# Patient Record
Sex: Male | Born: 1960 | Race: White | Hispanic: No | Marital: Married | Smoking: Never smoker
Health system: Southern US, Community
[De-identification: ages and names within clinical notes are randomized; demographics above are authoritative.]

## PROBLEM LIST (undated history)

## (undated) DIAGNOSIS — G4733 Obstructive sleep apnea (adult) (pediatric): Secondary | ICD-10-CM

## (undated) DIAGNOSIS — K219 Gastro-esophageal reflux disease without esophagitis: Secondary | ICD-10-CM

## (undated) DIAGNOSIS — Z950 Presence of cardiac pacemaker: Secondary | ICD-10-CM

## (undated) DIAGNOSIS — G4730 Sleep apnea, unspecified: Secondary | ICD-10-CM

## (undated) DIAGNOSIS — I2510 Atherosclerotic heart disease of native coronary artery without angina pectoris: Secondary | ICD-10-CM

## (undated) DIAGNOSIS — D649 Anemia, unspecified: Secondary | ICD-10-CM

## (undated) DIAGNOSIS — Z95828 Presence of other vascular implants and grafts: Secondary | ICD-10-CM

## (undated) DIAGNOSIS — M1990 Unspecified osteoarthritis, unspecified site: Secondary | ICD-10-CM

## (undated) DIAGNOSIS — Z9989 Dependence on other enabling machines and devices: Secondary | ICD-10-CM

## (undated) DIAGNOSIS — Z8489 Family history of other specified conditions: Secondary | ICD-10-CM

## (undated) DIAGNOSIS — T4145XA Adverse effect of unspecified anesthetic, initial encounter: Secondary | ICD-10-CM

## (undated) DIAGNOSIS — I890 Lymphedema, not elsewhere classified: Secondary | ICD-10-CM

## (undated) DIAGNOSIS — I700 Atherosclerosis of aorta: Secondary | ICD-10-CM

## (undated) DIAGNOSIS — I499 Cardiac arrhythmia, unspecified: Secondary | ICD-10-CM

## (undated) DIAGNOSIS — E785 Hyperlipidemia, unspecified: Secondary | ICD-10-CM

## (undated) DIAGNOSIS — F419 Anxiety disorder, unspecified: Secondary | ICD-10-CM

## (undated) DIAGNOSIS — I10 Essential (primary) hypertension: Secondary | ICD-10-CM

## (undated) DIAGNOSIS — R569 Unspecified convulsions: Secondary | ICD-10-CM

## (undated) DIAGNOSIS — Z8679 Personal history of other diseases of the circulatory system: Secondary | ICD-10-CM

## (undated) DIAGNOSIS — T8859XA Other complications of anesthesia, initial encounter: Secondary | ICD-10-CM

## (undated) DIAGNOSIS — R42 Dizziness and giddiness: Secondary | ICD-10-CM

## (undated) DIAGNOSIS — I447 Left bundle-branch block, unspecified: Secondary | ICD-10-CM

## (undated) DIAGNOSIS — H269 Unspecified cataract: Secondary | ICD-10-CM

## (undated) HISTORY — PX: OTHER SURGICAL HISTORY: SHX169

## (undated) HISTORY — PX: CT RADIATION THERAPY GUIDE: HXRAD513

## (undated) HISTORY — PX: KNEE SURGERY: SHX244

---
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
## 2010-03-27 DIAGNOSIS — C439 Malignant melanoma of skin, unspecified: Secondary | ICD-10-CM

## 2010-03-27 HISTORY — PX: MELANOMA EXCISION WITH SENTINEL LYMPH NODE BIOPSY: SHX5267

## 2010-03-27 HISTORY — DX: Malignant melanoma of skin, unspecified: C43.9

## 2015-05-29 ENCOUNTER — Emergency Department: Payer: BLUE CROSS/BLUE SHIELD

## 2015-05-29 ENCOUNTER — Encounter: Payer: Self-pay | Admitting: Emergency Medicine

## 2015-05-29 ENCOUNTER — Emergency Department
Admission: EM | Admit: 2015-05-29 | Discharge: 2015-05-29 | Disposition: A | Discharge status: Home / Self Care | Payer: BLUE CROSS/BLUE SHIELD | Attending: Student | Admitting: Student

## 2015-05-29 DIAGNOSIS — M25562 Pain in left knee: Secondary | ICD-10-CM | POA: Diagnosis present

## 2015-05-29 DIAGNOSIS — M1710 Unilateral primary osteoarthritis, unspecified knee: Secondary | ICD-10-CM

## 2015-05-29 DIAGNOSIS — M1712 Unilateral primary osteoarthritis, left knee: Secondary | ICD-10-CM | POA: Diagnosis not present

## 2015-05-29 DIAGNOSIS — I10 Essential (primary) hypertension: Secondary | ICD-10-CM | POA: Diagnosis not present

## 2015-05-29 HISTORY — DX: Essential (primary) hypertension: I10

## 2015-05-29 MED ORDER — HYDROCODONE-ACETAMINOPHEN 5-325 MG PO TABS: 1.0000 | ORAL_TABLET | ORAL | Status: DC | PRN

## 2015-05-29 MED ORDER — MELOXICAM 15 MG PO TABS: 15.0000 mg | ORAL_TABLET | Freq: Every day | ORAL | Status: DC

## 2015-05-29 MED ORDER — KETOROLAC TROMETHAMINE 60 MG/2ML IM SOLN
60.0000 mg | Freq: Once | INTRAMUSCULAR | Status: AC
  Administered 2015-05-29: 60 mg via INTRAMUSCULAR
  Filled 2015-05-29: qty 2

## 2015-05-29 NOTE — ED Notes (Signed)
Discussed discharge instructions, prescriptions, and follow-up care with patient. No questions or concerns at this time. Pt stable at discharge.  

## 2015-05-29 NOTE — Discharge Instructions (Signed)
Arthritis Arthritis is a term that is commonly used to refer to joint pain or joint disease. There are more than 100 types of arthritis. CAUSES The most common cause of this condition is wear and tear of a joint. Other causes include:  Gout.  Inflammation of a joint.  An infection of a joint.  Sprains and other injuries near the joint.  A drug reaction or allergic reaction. In some cases, the cause may not be known. SYMPTOMS The main symptom of this condition is pain in the joint with movement. Other symptoms include:  Redness, swelling, or stiffness at a joint.  Warmth coming from the joint.  Fever.  Overall feeling of illness. DIAGNOSIS This condition may be diagnosed with a physical exam and tests, including:  Blood tests.  Urine tests.  Imaging tests, such as MRI, X-rays, or a CT scan. Sometimes, fluid is removed from a joint for testing. TREATMENT Treatment for this condition may involve:  Treatment of the cause, if it is known.  Rest.  Raising (elevating) the joint.  Applying cold or hot packs to the joint.  Medicines to improve symptoms and reduce inflammation.  Injections of a steroid such as cortisone into the joint to help reduce pain and inflammation. Depending on the cause of your arthritis, you may need to make lifestyle changes to reduce stress on your joint. These changes may include exercising more and losing weight. HOME CARE INSTRUCTIONS Medicines  Take over-the-counter and prescription medicines only as told by your health care provider.  Do not take aspirin to relieve pain if gout is suspected. Activities  Rest your joint if told by your health care provider. Rest is important when your disease is active and your joint feels painful, swollen, or stiff.  Avoid activities that make the pain worse. It is important to balance activity with rest.  Exercise your joint regularly with range-of-motion exercises as told by your health care  provider. Try doing low-impact exercise, such as:  Swimming.  Water aerobics.  Biking.  Walking. Joint Care  If your joint is swollen, keep it elevated if told by your health care provider.  If your joint feels stiff in the morning, try taking a warm shower.  If directed, apply heat to the joint. If you have diabetes, do not apply heat without permission from your health care provider.  Put a towel between the joint and the hot pack or heating pad.  Leave the heat on the area for 20-30 minutes.  If directed, apply ice to the joint:  Put ice in a plastic bag.  Place a towel between your skin and the bag.  Leave the ice on for 20 minutes, 2-3 times per day.  Keep all follow-up visits as told by your health care provider. This is important. SEEK MEDICAL CARE IF:  The pain gets worse.  You have a fever. SEEK IMMEDIATE MEDICAL CARE IF:  You develop severe joint pain, swelling, or redness.  Many joints become painful and swollen.  You develop severe back pain.  You develop severe weakness in your leg.  You cannot control your bladder or bowels.   This information is not intended to replace advice given to you by your health care provider. Make sure you discuss any questions you have with your health care provider.   Document Released: 04/20/2004 Document Revised: 12/02/2014 Document Reviewed: 06/08/2014 Elsevier Interactive Patient Education 2016 [place] therapy can help ease sore, stiff, injured, and tight muscles and  joints. Heat relaxes your muscles, which may help ease your pain. Heat therapy should only be used on old, pre-existing, or long-lasting (chronic) injuries. Do not use heat therapy unless told by your doctor. HOW TO USE HEAT THERAPY There are several different kinds of heat therapy, including:  Moist heat pack.  Warm water bath.  Hot water bottle.  Electric heating pad.  Heated gel pack.  Heated wrap.  Electric  heating pad. GENERAL HEAT THERAPY RECOMMENDATIONS   Do not sleep while using heat therapy. Only use heat therapy while you are awake.  Your skin may turn pink while using heat therapy. Do not use heat therapy if your skin turns red.  Do not use heat therapy if you have new pain.  High heat or long exposure to heat can cause burns. Be careful when using heat therapy to avoid burning your skin.  Do not use heat therapy on areas of your skin that are already irritated, such as with a rash or sunburn. GET HELP IF:   You have blisters, redness, swelling (puffiness), or numbness.  You have new pain.  Your pain is worse. MAKE SURE YOU:  Understand these instructions.  Will watch your condition.  Will get help right away if you are not doing well or get worse.   This information is not intended to replace advice given to you by your health care provider. Make sure you discuss any questions you have with your health care provider.   Document Released: 06/05/2011 Document Revised: 04/03/2014 Document Reviewed: 05/06/2013 Elsevier Interactive Patient Education 2016 Elsevier Inc.  Osteoarthritis Osteoarthritis is a disease that causes soreness and inflammation of a joint. It occurs when the cartilage at the affected joint wears down. Cartilage acts as a cushion, covering the ends of bones where they meet to form a joint. Osteoarthritis is the most common form of arthritis. It often occurs in older people. The joints affected most often by this condition include those in the:  Ends of the fingers.  Thumbs.  Neck.  Lower back.  Knees.  Hips. CAUSES  Over time, the cartilage that covers the ends of bones begins to wear away. This causes bone to rub on bone, producing pain and stiffness in the affected joints.  RISK FACTORS Certain factors can increase your chances of having osteoarthritis, including:  Older age.  Excessive body weight.  Overuse of joints.  Previous joint  injury. SIGNS AND SYMPTOMS   Pain, swelling, and stiffness in the joint.  Over time, the joint may lose its normal shape.  Small deposits of bone (osteophytes) may grow on the edges of the joint.  Bits of bone or cartilage can break off and float inside the joint space. This may cause more pain and damage. DIAGNOSIS  Your health care provider will do a physical exam and ask about your symptoms. Various tests may be ordered, such as:  X-rays of the affected joint.  Blood tests to rule out other types of arthritis. Additional tests may be used to diagnose your condition. TREATMENT  Goals of treatment are to control pain and improve joint function. Treatment plans may include:  A prescribed exercise program that allows for rest and joint relief.  A weight control plan.  Pain relief techniques, such as:  Properly applied heat and cold.  Electric pulses delivered to nerve endings under the skin (transcutaneous electrical nerve stimulation [TENS]).  Massage.  Certain nutritional supplements.  Medicines to control pain, such as:  Acetaminophen.  Nonsteroidal anti-inflammatory  drugs (NSAIDs), such as naproxen.  Narcotic or central-acting agents, such as tramadol.  Corticosteroids. These can be given orally or as an injection.  Surgery to reposition the bones and relieve pain (osteotomy) or to remove loose pieces of bone and cartilage. Joint replacement may be needed in advanced states of osteoarthritis. HOME CARE INSTRUCTIONS   Take medicines only as directed by your health care provider.  Maintain a healthy weight. Follow your health care provider's instructions for weight control. This may include dietary instructions.  Exercise as directed. Your health care provider can recommend specific types of exercise. These may include:  Strengthening exercises. These are done to strengthen the muscles that support joints affected by arthritis. They can be performed with weights  or with exercise bands to add resistance.  Aerobic activities. These are exercises, such as brisk walking or low-impact aerobics, that get your heart pumping.  Range-of-motion activities. These keep your joints limber.  Balance and agility exercises. These help you maintain daily living skills.  Rest your affected joints as directed by your health care provider.  Keep all follow-up visits as directed by your health care provider. SEEK MEDICAL CARE IF:   Your skin turns red.  You develop a rash in addition to your joint pain.  You have worsening joint pain.  You have a fever along with joint or muscle aches. SEEK IMMEDIATE MEDICAL CARE IF:  You have a significant loss of weight or appetite.  You have night sweats. [place] of Arthritis and Musculoskeletal and Skin Diseases: www.niams.SouthExposed.es  [employer] on Aging: http://kim-miller.com/  American College of Rheumatology: www.rheumatology.org   This information is not intended to replace advice given to you by your health care provider. Make sure you discuss any questions you have with your health care provider.   Document Released: 03/13/2005 Document Revised: 04/03/2014 Document Reviewed: 11/18/2012 Elsevier Interactive Patient Education [employer].

## 2015-05-29 NOTE — ED Notes (Signed)
L knee pain since yesterday, no known injury

## 2015-05-29 NOTE — ED Provider Notes (Signed)
[facility] Emergency Department Provider Note  ____________________________________________  Time seen: Approximately 8:08 AM  I have reviewed the triage vital signs and the nursing notes.   HISTORY  Chief Complaint Knee Pain    HPI Edward Pitts is a 55 y.o. male with a past medical history of left knee surgery years ago presents with a sudden onset of left knee pain since yesterday and worse this morning. States that he's got increased pain with flexion and standing or bearing weight. Denies any recent trauma. Describes pain as 10 over 10 when exacerbated.Denies any history of gout.   Past Medical History  Diagnosis Date  . Hypertension     There are no active problems to display for this patient.   Past Surgical History  Procedure Laterality Date  . Knee surgery Left     Current Outpatient Rx  Name  Route  Sig  Dispense  Refill  . HYDROcodone-acetaminophen (NORCO) 5-325 MG tablet   Oral   Take 1-2 tablets by mouth every 4 (four) hours as needed for moderate pain.   15 tablet   0   . meloxicam (MOBIC) 15 MG tablet   Oral   Take 1 tablet (15 mg total) by mouth daily.   30 tablet   0     Allergies Review of patient's allergies indicates no known allergies.  No family history on file.  Social History Social History  Substance Use Topics  . Smoking status: Never Smoker   . Smokeless tobacco: None  . Alcohol Use: No    Review of Systems Constitutional: No fever/chills Musculoskeletal: Positive for left knee pain. Skin: Negative for rash. Neurological: Negative for headaches, focal weakness or numbness.  10-point ROS otherwise negative.  ____________________________________________   PHYSICAL EXAM:  VITAL SIGNS: ED Triage Vitals  Enc Vitals Group     BP 05/29/15 0744 168/67 mmHg     Pulse Rate 05/29/15 0744 79     Resp 05/29/15 0744 20     Temp 05/29/15 0744 97.9 F (36.6 C)     Temp Source 05/29/15 0744 Oral   SpO2 05/29/15 0745 97 %     Weight 05/29/15 0744 220 lb (99.791 kg)     Height 05/29/15 0744 5\' 8"  (1.727 m)     Head Cir --      Peak Flow --      Pain Score 05/29/15 0745 10     Pain Loc --      Pain Edu? --      Excl. in [place]? --     Constitutional: Alert and oriented. Well appearing and in no acute distress. Cardiovascular: Normal rate, regular rhythm. Grossly normal heart sounds.  Good peripheral circulation. Respiratory: Normal respiratory effort.  No retractions. Lungs CTAB. Musculoskeletal: Left knee with limited range of motion increased pain with flexion. Distally neurovascularly intact. Positive grinding and crepitus sensation noted. Positive warmth, tenderness and edema noted. Neurologic:  Normal speech and language. No gross focal neurologic deficits are appreciated. No gait instability. Skin:  Skin is warm, dry and intact. No rash noted. Psychiatric: Mood and affect are normal. Speech and behavior are normal.  ____________________________________________   LABS (all labs ordered are listed, but only abnormal results are displayed)  Labs Reviewed - No data to display  RADIOLOGY  IMPRESSION: 1. No acute findings. 2. Moderate tricompartmental degenerative change of the knee. 3. Peripherally calcified ossicle adjacent to the posterior lateral aspect of the tibia likely the sequela of remote avulsive injury. ____________________________________________  PROCEDURES  Procedure(s) performed: None  Critical Care performed: No  ____________________________________________   INITIAL IMPRESSION / ASSESSMENT AND PLAN / ED COURSE  Pertinent labs & imaging results that were available during my care of the patient were reviewed by me and considered in my medical decision making (see chart for details).  Severe degenerative joint disease of the left knee/osteoarthritis. Rx given for Mobic 15 mg daily, hydrocodone 5/325. Patient states he does not need crutches and will  follow-up with orthopedics as soon as possible. ____________________________________________   FINAL CLINICAL IMPRESSION(S) / ED DIAGNOSES  Final diagnoses:  Osteoarthritis of knee, unilateral     This chart was dictated using voice recognition software/Dragon. Despite best efforts to proofread, errors can occur which can change the meaning. Any change was purely unintentional.   Arlyss Repress, PA-C 05/29/15 [id number]  Joanne Gavel, MD 05/29/15 [phone]

## 2017-05-23 ENCOUNTER — Emergency Department
Admission: EM | Admit: 2017-05-23 | Discharge: 2017-05-23 | Disposition: A | Discharge status: Home / Self Care | Payer: BLUE CROSS/BLUE SHIELD | Attending: Emergency Medicine | Admitting: Emergency Medicine

## 2017-05-23 ENCOUNTER — Encounter: Payer: Self-pay | Admitting: Emergency Medicine

## 2017-05-23 DIAGNOSIS — I10 Essential (primary) hypertension: Secondary | ICD-10-CM | POA: Diagnosis not present

## 2017-05-23 DIAGNOSIS — R55 Syncope and collapse: Secondary | ICD-10-CM | POA: Diagnosis not present

## 2017-05-23 DIAGNOSIS — R42 Dizziness and giddiness: Secondary | ICD-10-CM | POA: Diagnosis present

## 2017-05-23 DIAGNOSIS — Z79899 Other long term (current) drug therapy: Secondary | ICD-10-CM | POA: Diagnosis not present

## 2017-05-23 LAB — BASIC METABOLIC PANEL
ANION GAP: 10 (ref 5–15)
BUN: 29 mg/dL — ABNORMAL HIGH (ref 6–20)
CALCIUM: 9.3 mg/dL (ref 8.9–10.3)
CO2: 24 mmol/L (ref 22–32)
Chloride: 105 mmol/L (ref 101–111)
Creatinine, Ser: 1.65 mg/dL — ABNORMAL HIGH (ref 0.61–1.24)
GFR calc non Af Amer: 45 mL/min — ABNORMAL LOW (ref >60)
GFR, EST AFRICAN AMERICAN: 52 mL/min — AB (ref >60)
Glucose, Bld: 106 mg/dL — ABNORMAL HIGH (ref 65–99)
Potassium: 3.9 mmol/L (ref 3.5–5.1)
SODIUM: 139 mmol/L (ref 135–145)

## 2017-05-23 LAB — URINALYSIS, COMPLETE (UACMP) WITH MICROSCOPIC
BILIRUBIN URINE: NEGATIVE
Bacteria, UA: NONE SEEN
GLUCOSE, UA: NEGATIVE mg/dL
HGB URINE DIPSTICK: NEGATIVE
KETONES UR: NEGATIVE mg/dL
LEUKOCYTES UA: NEGATIVE
NITRITE: NEGATIVE
PH: 5 (ref 5.0–8.0)
Protein, ur: NEGATIVE mg/dL
RBC / HPF: NONE SEEN RBC/hpf (ref 0–5)
Specific Gravity, Urine: 1.018 (ref 1.005–1.030)
Squamous Epithelial / LPF: NONE SEEN

## 2017-05-23 LAB — CBC
HCT: 38.8 % — ABNORMAL LOW (ref 40.0–52.0)
HEMOGLOBIN: 13.3 g/dL (ref 13.0–18.0)
MCH: 30 pg (ref 26.0–34.0)
MCHC: 34.2 g/dL (ref 32.0–36.0)
MCV: 87.7 fL (ref 80.0–100.0)
Platelets: 290 10*3/uL (ref 150–440)
RBC: 4.42 MIL/uL (ref 4.40–5.90)
RDW: 13.5 % (ref 11.5–14.5)
WBC: 7.3 10*3/uL (ref 3.8–10.6)

## 2017-05-23 LAB — TROPONIN I
Troponin I: <0.03 ng/mL (ref <0.03)
Troponin I: <0.03 ng/mL (ref <0.03)

## 2017-05-23 MED ORDER — SODIUM CHLORIDE 0.9 % IV BOLUS (SEPSIS)
1000.0000 mL | Freq: Once | INTRAVENOUS | Status: AC
  Administered 2017-05-23: 1000 mL via INTRAVENOUS

## 2017-05-23 NOTE — ED Notes (Signed)
FIRST NURSE NOTE: pt states he was getting out of his car a few minutes PTA and had a near syncopal episode. States he was dizzy and sat down on the ground, states lasted 1-2 minutes. States still having dizziness but not as bad. Denies C/P or SOB.Marland Kitchen

## 2017-05-23 NOTE — Discharge Instructions (Signed)
Return to the ER for new, worsening, or persistent dizziness, weakness, recurrent passing out, chest pain, difficulty breathing, severe headache, or any other new or worsening symptoms that concern you.  Follow-up with your primary care doctor within the next 1-2 weeks.  We have provided a referral to a cardiologist in the area, however if you would prefer to follow-up with somebody else through your regular doctor you may do so as well.  Your EKG shows a "left bundle branch block."  At this time this does not appear to be related to your symptoms, but you should see a cardiologist to determine if you need any other tests as an outpatient.

## 2017-05-23 NOTE — ED Provider Notes (Signed)
[facility] Emergency Department Provider Note ____________________________________________   First MD Initiated Contact with Patient 05/23/17 1423     (approximate)  I have reviewed the triage vital signs and the nursing notes.   HISTORY  Chief Complaint Dizziness    HPI Edward Pitts is a 57 y.o. male with past medical history of hypertension who presents with near syncope, acute onset while the patient was getting out of his truck, causing him to fall to the ground.  The patient states that it occurred approximately hour prior to arrival.  He states that before it happened he started to feel intensely dizzy for a few moments.  He states that he now feels slightly dizzy.  He describes the dizziness as more lightheaded than spinning or movement.  He denies any associated headache, nausea or vomiting, chest pain, difficulty breathing, fevers, or weakness.  No prior history of this symptom.  Patient states he was in his usual state of health until today.   Past Medical History:  Diagnosis Date  . Hypertension     There are no active problems to display for this patient.   Past Surgical History:  Procedure Laterality Date  . KNEE SURGERY Left     Prior to Admission medications   Medication Sig Start Date End Date Taking? Authorizing Provider  meloxicam (MOBIC) 15 MG tablet Take 1 tablet (15 mg total) by mouth daily. 05/29/15  Yes Beers, Pierce Crane, PA-C  HYDROcodone-acetaminophen (NORCO) 5-325 MG tablet Take 1-2 tablets by mouth every 4 (four) hours as needed for moderate pain. Patient not taking: Reported on 05/23/2017 05/29/15   Arlyss Repress, PA-C    Allergies Patient has no known allergies.  No family history on file.  Social History Social History   Tobacco Use  . Smoking status: Never Smoker  . Smokeless tobacco: Never Used  Substance Use Topics  . Alcohol use: No  . Drug use: No    Review of Systems  Constitutional: No  fever/chills. Eyes: No visual changes. ENT: No neck pain. Cardiovascular: Denies chest pain. Respiratory: Denies shortness of breath. Gastrointestinal: No nausea, no vomiting.  No diarrhea.  Genitourinary: Negative for dysuria.  Musculoskeletal: Negative for back pain. Skin: Negative for rash. Neurological: Negative for headaches, focal weakness or numbness.   ____________________________________________   PHYSICAL EXAM:  VITAL SIGNS: ED Triage Vitals  Enc Vitals Group     BP 05/23/17 1402 (!) 157/57     Pulse Rate 05/23/17 1402 85     Resp 05/23/17 1402 17     Temp 05/23/17 1402 98.3 F (36.8 C)     Temp Source 05/23/17 1402 Oral     SpO2 05/23/17 1402 95 %     Weight 05/23/17 1403 225 lb (102.1 kg)     Height 05/23/17 1403 5\' 7"  (1.702 m)     Head Circumference --      Peak Flow --      Pain Score --      Pain Loc --      Pain Edu? --      Excl. in [place]? --     Constitutional: Alert and oriented. Well appearing and in no acute distress. Eyes: Conjunctivae are normal.  Head: Atraumatic. Nose: No congestion/rhinnorhea. Mouth/Throat: Mucous membranes are moist.   Neck: Normal range of motion.  Cardiovascular: Normal rate, regular rhythm. Grossly normal heart sounds.  Good peripheral circulation. Respiratory: Normal respiratory effort.  No retractions. Lungs CTAB. Gastrointestinal: Soft and nontender. No distention.  Genitourinary: No flank tenderness. Musculoskeletal: No lower extremity edema.  Extremities warm and well perfused.  Motor and sensory intact in all extremities.  Normal coordination with no ataxia on finger to nose, cranial nerves III through XII intact. Neurologic:  Normal speech and language. No gross focal neurologic deficits are appreciated.  Skin:  Skin is warm and dry. No rash noted. Psychiatric: Mood and affect are normal. Speech and behavior are normal.  ____________________________________________   LABS (all labs ordered are listed, but only  abnormal results are displayed)  Labs Reviewed  BASIC METABOLIC PANEL - Abnormal; Notable for the following components:      Result Value   Glucose, Bld 106 (*)    BUN 29 (*)    Creatinine, Ser 1.65 (*)    GFR calc non Af Amer 45 (*)    GFR calc Af Amer 52 (*)    All other components within normal limits  CBC - Abnormal; Notable for the following components:   HCT 38.8 (*)    All other components within normal limits  URINALYSIS, COMPLETE (UACMP) WITH MICROSCOPIC - Abnormal; Notable for the following components:   Color, Urine YELLOW (*)    APPearance CLEAR (*)    All other components within normal limits  TROPONIN I  TROPONIN I  CBG MONITORING, ED   ____________________________________________  EKG  ED ECG REPORT I, Arta Silence, the attending physician, personally viewed and interpreted this ECG.  Date: 05/23/2017 EKG Time: 1404 Rate: 85 Rhythm: normal sinus rhythm QRS Axis: Left axis Intervals: Left bundle branch block ST/T Wave abnormalities: normal Narrative Interpretation: LBBB; no old EKG for comparison   ED ECG REPORT I, Arta Silence, the attending physician, personally viewed and interpreted this ECG.  Date: 05/23/2017 EKG Time: 1703 Rate: 75 Rhythm: normal sinus rhythm QRS Axis: Left axis Intervals: LBBB ST/T Wave abnormalities: normal Narrative Interpretation: No significant change when compared to EKG of 1404 today   ____________________________________________  RADIOLOGY    ____________________________________________   PROCEDURES  Procedure(s) performed: No  Procedures  Critical Care performed: No ____________________________________________   INITIAL IMPRESSION / ASSESSMENT AND PLAN / ED COURSE  Pertinent labs & imaging results that were available during my care of the patient were reviewed by me and considered in my medical decision making (see chart for details).  56 year old male with history of hypertension  presents with near syncope or very brief syncope, with associated fall and with a brief prodrome of dizziness, but no significant associated symptoms.  On exam, the patient is well-appearing, vital signs are normal except for slight hypertension, and the remainder the exam is unremarkable.  Neuro exam is nonfocal.  Patient reports mild remaining lightheadedness but is otherwise asymptomatic.  EKG shows left bundle branch block.  Patient has no prior EKG in our system, and states his last EKG was performed approximately 10 or 11 years ago when he was in the [employer].  He states that his primary care doctor is not performed one that he can remember.  He has not heard about having a left bundle branch block previously.  Overall the presentation is most consistent with vasovagal syncope or other benign cause.  Given that there was a prodrome and patient has some lightheadedness, this is less consistent with cardiac cause acutely.  Patient has no chest pain, shortness of breath, or other anginal equivalents to suggest ACS, and there is no evidence that this left bundle represents an acute cardiac event.  Plan: Basic labs, cardiac enzymes x2, IV  fluids, and reassess.    ----------------------------------------- 8:40 PM on 05/23/2017 -----------------------------------------  Basic labs are within normal limits, and troponin has been negative x2.  Repeat EKG shows LBBB unchanged.  The patient is feeling  better after fluids.  At this time, there is no indication for additional ED workup or observation.  The patient feels well to go home.  Return precautions given, and he expresses understanding.  He agrees to follow-up with his primary care doctor.  We also have provided cardiology referral.  ____________________________________________   FINAL CLINICAL IMPRESSION(S) / ED DIAGNOSES  Final diagnoses:  Near syncope      NEW MEDICATIONS STARTED DURING THIS VISIT:  Discharge Medication List as of  05/23/2017  7:30 PM       Note:  This document was prepared using Dragon voice recognition software and may include unintentional dictation errors.    Arta Silence, MD 05/23/17 2041

## 2017-05-23 NOTE — ED Notes (Signed)

## 2017-05-23 NOTE — ED Triage Notes (Signed)
Pt comes into the ED via POV c/o dizziness that started on his way to the gym.  Patient states that he feels off ballance instead of the room actually spinning.  He states he did fall trying to get into his car trying to get to the gym.  Patient denies any LOC and is neurologically intact otherwise.  Patient in NAD with even and unlabored respirations.

## 2017-05-25 DIAGNOSIS — I10 Essential (primary) hypertension: Secondary | ICD-10-CM | POA: Insufficient documentation

## 2017-05-25 DIAGNOSIS — I447 Left bundle-branch block, unspecified: Secondary | ICD-10-CM | POA: Insufficient documentation

## 2017-06-27 DIAGNOSIS — I208 Other forms of angina pectoris: Secondary | ICD-10-CM | POA: Diagnosis present

## 2017-06-29 ENCOUNTER — Encounter
Admission: RE | Disposition: A | Discharge status: Home / Self Care | Payer: Self-pay | Source: Ambulatory Visit | Attending: Internal Medicine

## 2017-06-29 ENCOUNTER — Encounter: Payer: Self-pay | Admitting: *Deleted

## 2017-06-29 ENCOUNTER — Ambulatory Visit
Admission: RE | Admit: 2017-06-29 | Discharge: 2017-06-29 | Disposition: A | Discharge status: Home / Self Care | Payer: BLUE CROSS/BLUE SHIELD | Source: Ambulatory Visit | Attending: Internal Medicine | Admitting: Internal Medicine

## 2017-06-29 DIAGNOSIS — Z79899 Other long term (current) drug therapy: Secondary | ICD-10-CM | POA: Diagnosis not present

## 2017-06-29 DIAGNOSIS — R079 Chest pain, unspecified: Secondary | ICD-10-CM

## 2017-06-29 DIAGNOSIS — I25119 Atherosclerotic heart disease of native coronary artery with unspecified angina pectoris: Secondary | ICD-10-CM | POA: Diagnosis not present

## 2017-06-29 DIAGNOSIS — I10 Essential (primary) hypertension: Secondary | ICD-10-CM | POA: Insufficient documentation

## 2017-06-29 DIAGNOSIS — E7800 Pure hypercholesterolemia, unspecified: Secondary | ICD-10-CM | POA: Insufficient documentation

## 2017-06-29 DIAGNOSIS — Z791 Long term (current) use of non-steroidal anti-inflammatories (NSAID): Secondary | ICD-10-CM | POA: Insufficient documentation

## 2017-06-29 DIAGNOSIS — I208 Other forms of angina pectoris: Secondary | ICD-10-CM | POA: Diagnosis present

## 2017-06-29 DIAGNOSIS — I447 Left bundle-branch block, unspecified: Secondary | ICD-10-CM | POA: Diagnosis not present

## 2017-06-29 DIAGNOSIS — I2089 Other forms of angina pectoris: Secondary | ICD-10-CM | POA: Diagnosis present

## 2017-06-29 HISTORY — DX: Hyperlipidemia, unspecified: E78.5

## 2017-06-29 HISTORY — PX: LEFT HEART CATH AND CORONARY ANGIOGRAPHY: CATH118249

## 2017-06-29 SURGERY — LEFT HEART CATH AND CORONARY ANGIOGRAPHY
Anesthesia: Moderate Sedation | Laterality: Left

## 2017-06-29 MED ORDER — MIDAZOLAM HCL 2 MG/2ML IJ SOLN
INTRAMUSCULAR | Status: DC | PRN
  Administered 2017-06-29: 1 mg via INTRAVENOUS

## 2017-06-29 MED ORDER — FENTANYL CITRATE (PF) 100 MCG/2ML IJ SOLN
INTRAMUSCULAR | Status: AC
  Filled 2017-06-29: qty 2

## 2017-06-29 MED ORDER — SODIUM CHLORIDE 0.9 % WEIGHT BASED INFUSION
3.0000 mL/kg/h | INTRAVENOUS | Status: AC
  Administered 2017-06-29: 3 mL/kg/h via INTRAVENOUS

## 2017-06-29 MED ORDER — ACETAMINOPHEN 325 MG PO TABS: 650.0000 mg | ORAL_TABLET | ORAL | Status: DC | PRN

## 2017-06-29 MED ORDER — SODIUM CHLORIDE 0.9 % IV SOLN: 250.0000 mL | INTRAVENOUS | Status: DC | PRN

## 2017-06-29 MED ORDER — HEPARIN (PORCINE) IN NACL 2-0.9 UNIT/ML-% IJ SOLN
INTRAMUSCULAR | Status: AC
  Filled 2017-06-29: qty 1000

## 2017-06-29 MED ORDER — IOPAMIDOL (ISOVUE-300) INJECTION 61%
INTRAVENOUS | Status: DC | PRN
  Administered 2017-06-29: 110 mL via INTRA_ARTERIAL

## 2017-06-29 MED ORDER — SODIUM CHLORIDE 0.9% FLUSH: 3.0000 mL | INTRAVENOUS | Status: DC | PRN

## 2017-06-29 MED ORDER — FENTANYL CITRATE (PF) 100 MCG/2ML IJ SOLN
INTRAMUSCULAR | Status: DC | PRN
  Administered 2017-06-29: 50 ug via INTRAVENOUS

## 2017-06-29 MED ORDER — SODIUM CHLORIDE 0.9 % WEIGHT BASED INFUSION: 1.0000 mL/kg/h | INTRAVENOUS | Status: DC

## 2017-06-29 MED ORDER — SODIUM CHLORIDE 0.9% FLUSH: 3.0000 mL | Freq: Two times a day (BID) | INTRAVENOUS | Status: DC

## 2017-06-29 MED ORDER — ONDANSETRON HCL 4 MG/2ML IJ SOLN: 4.0000 mg | Freq: Four times a day (QID) | INTRAMUSCULAR | Status: DC | PRN

## 2017-06-29 MED ORDER — ASPIRIN 81 MG PO CHEW: 81.0000 mg | CHEWABLE_TABLET | ORAL | Status: DC

## 2017-06-29 MED ORDER — MIDAZOLAM HCL 2 MG/2ML IJ SOLN
INTRAMUSCULAR | Status: AC
  Filled 2017-06-29: qty 2

## 2017-06-29 SURGICAL SUPPLY — 9 items
CATH INFINITI 5FR ANG PIGTAIL (CATHETERS) ×3 IMPLANT
CATH INFINITI 5FR JL4 (CATHETERS) ×3 IMPLANT
CATH INFINITI JR4 5F (CATHETERS) ×3 IMPLANT
DEVICE CLOSURE MYNXGRIP 5F (Vascular Products) ×3 IMPLANT
KIT MANI 3VAL PERCEP (MISCELLANEOUS) ×3 IMPLANT
NEEDLE PERC 18GX7CM (NEEDLE) ×3 IMPLANT
PACK CARDIAC CATH (CUSTOM PROCEDURE TRAY) ×3 IMPLANT
SHEATH PINNACLE 5F 10CM (SHEATH) ×3 IMPLANT
WIRE GUIDERIGHT .035X150 (WIRE) ×3 IMPLANT

## 2017-06-29 NOTE — Discharge Instructions (Signed)

## 2017-06-29 NOTE — Progress Notes (Signed)
Patient clinically stable post heart cath with Dr Nehemiah Massed, denies complaints. Family at bedside. Dr Nehemiah Massed out to speak with family/patient with questions answered. No bleeding nor hematoma at right groin site. Discharge instructions given with questions answered.

## 2017-07-09 DIAGNOSIS — R002 Palpitations: Secondary | ICD-10-CM | POA: Insufficient documentation

## 2017-07-09 DIAGNOSIS — I2510 Atherosclerotic heart disease of native coronary artery without angina pectoris: Secondary | ICD-10-CM | POA: Insufficient documentation

## 2017-11-21 ENCOUNTER — Emergency Department
Admission: EM | Admit: 2017-11-21 | Discharge: 2017-11-21 | Disposition: A | Discharge status: Home / Self Care | Payer: BLUE CROSS/BLUE SHIELD | Attending: Emergency Medicine | Admitting: Emergency Medicine

## 2017-11-21 ENCOUNTER — Other Ambulatory Visit: Payer: Self-pay

## 2017-11-21 DIAGNOSIS — Z79899 Other long term (current) drug therapy: Secondary | ICD-10-CM | POA: Insufficient documentation

## 2017-11-21 DIAGNOSIS — R209 Unspecified disturbances of skin sensation: Secondary | ICD-10-CM | POA: Diagnosis not present

## 2017-11-21 DIAGNOSIS — R531 Weakness: Secondary | ICD-10-CM | POA: Diagnosis present

## 2017-11-21 DIAGNOSIS — I10 Essential (primary) hypertension: Secondary | ICD-10-CM | POA: Insufficient documentation

## 2017-11-21 LAB — BASIC METABOLIC PANEL
ANION GAP: 11 (ref 5–15)
BUN: 45 mg/dL — ABNORMAL HIGH (ref 6–20)
CALCIUM: 9.4 mg/dL (ref 8.9–10.3)
CO2: 24 mmol/L (ref 22–32)
Chloride: 103 mmol/L (ref 98–111)
Creatinine, Ser: 1.64 mg/dL — ABNORMAL HIGH (ref 0.61–1.24)
GFR, EST AFRICAN AMERICAN: 52 mL/min — AB (ref >60)
GFR, EST NON AFRICAN AMERICAN: 45 mL/min — AB (ref >60)
GLUCOSE: 104 mg/dL — AB (ref 70–99)
Potassium: 4.8 mmol/L (ref 3.5–5.1)
Sodium: 138 mmol/L (ref 135–145)

## 2017-11-21 LAB — CBC WITH DIFFERENTIAL/PLATELET
BASOS ABS: 0 10*3/uL (ref 0–0.1)
BASOS PCT: 0 %
Eosinophils Absolute: 0.2 10*3/uL (ref 0–0.7)
Eosinophils Relative: 2 %
HEMATOCRIT: 33.7 % — AB (ref 40.0–52.0)
Hemoglobin: 11.6 g/dL — ABNORMAL LOW (ref 13.0–18.0)
LYMPHS PCT: 18 %
Lymphs Abs: 1.5 10*3/uL (ref 1.0–3.6)
MCH: 30.6 pg (ref 26.0–34.0)
MCHC: 34.6 g/dL (ref 32.0–36.0)
MCV: 88.5 fL (ref 80.0–100.0)
Monocytes Absolute: 0.8 10*3/uL (ref 0.2–1.0)
Monocytes Relative: 9 %
NEUTROS ABS: 5.9 10*3/uL (ref 1.4–6.5)
NEUTROS PCT: 71 %
Platelets: 339 10*3/uL (ref 150–440)
RBC: 3.8 MIL/uL — AB (ref 4.40–5.90)
RDW: 13 % (ref 11.5–14.5)
WBC: 8.3 10*3/uL (ref 3.8–10.6)

## 2017-11-21 LAB — TROPONIN I
Troponin I: <0.03 ng/mL (ref <0.03)
Troponin I: <0.03 ng/mL (ref <0.03)

## 2017-11-21 NOTE — Discharge Instructions (Addendum)
Please seek medical attention for any high fevers, chest pain, shortness of breath, change in behavior, persistent vomiting, bloody stool or any other new or concerning symptoms.  

## 2017-11-21 NOTE — ED Provider Notes (Signed)
[facility] Emergency Department Provider Note   ____________________________________________   I have reviewed the triage vital signs and the nursing notes.   HISTORY  Chief Complaint "Shock"  History limited by: Not Limited   HPI Edward Pitts is a 57 y.o. male who presents to the emergency department today because of a feeling of an electrical shock.  Patient states that he felt throughout his whole body.  It lasted about a minute.  While this was going on he felt some right arm weakness.  Patient states that he is felt this same kind of shocklike feeling before.  Last time was a few days ago.  He denies any associated shortness of breath.  Denies any nausea or vomiting.  Per medical record review patient has a history of HTN, HLD  Past Medical History:  Diagnosis Date  . Hyperlipidemia   . Hypertension     Patient Active Problem List   Diagnosis Date Noted  . 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 KNEE SURGERY Left   . LEFT HEART CATH AND CORONARY ANGIOGRAPHY Left 06/29/2017   Procedure: LEFT HEART CATH AND CORONARY ANGIOGRAPHY;  Surgeon: [name], MD;  Location: [facility];  Service: Cardiovascular;  Laterality: Left;    Prior to Admission medications   Medication Sig Start Date End Date Taking? Authorizing Provider  atorvastatin (LIPITOR) 40 MG tablet Take 40 mg by mouth at bedtime.    [provider]  hydrochlorothiazide (HYDRODIURIL) 25 MG tablet Take 25 mg by mouth daily.    [provider]  hydrocortisone 2.5 % cream Apply 1 application topically daily as needed.     [provider]  ketoconazole (NIZORAL) 2 % cream Apply 1 application topically daily as needed.     [provider]  lisinopril (PRINIVIL,ZESTRIL) 10 MG tablet Take 10 mg by mouth daily.    [provider]  Multiple Vitamins-Minerals (CENTRUM SILVER PO) Take 1 tablet  by mouth daily.    [provider]    Allergies Patient has no known allergies.  History reviewed. No pertinent family history.  Social History Social History   Tobacco Use  . Smoking status: Never Smoker  . Smokeless tobacco: Never Used  Substance Use Topics  . Alcohol use: No  . Drug use: No    Review of Systems Constitutional: No fever/chills Eyes: No visual changes. ENT: No sore throat. Cardiovascular: Denies chest pain. Respiratory: Denies shortness of breath. Gastrointestinal: No abdominal pain.  No nausea, no vomiting.  No diarrhea.   Genitourinary: Negative for dysuria. Musculoskeletal: Negative for back pain. Skin: Negative for rash. Neurological: Right arm weakness.  ____________________________________________   PHYSICAL EXAM:  VITAL SIGNS: ED Triage Vitals  Enc Vitals Group     BP 11/21/17 1551 129/64     Pulse Rate 11/21/17 1551 76     Resp 11/21/17 1551 16     Temp 11/21/17 1551 98.9 F (37.2 C)     Temp Source 11/21/17 1551 Oral     SpO2 11/21/17 1551 97 %     Weight 11/21/17 1554 225 lb (102.1 kg)     Height 11/21/17 1554 5\' 7"  (1.702 m)     Head Circumference --      Peak Flow --      Pain Score 11/21/17 1551 0   Constitutional: Alert and oriented.  Eyes: Conjunctivae are normal.  ENT      Head: Normocephalic  and atraumatic.      Nose: No congestion/rhinnorhea.      Mouth/Throat: Mucous membranes are moist.      Neck: No stridor. Hematological/Lymphatic/Immunilogical: No cervical lymphadenopathy. Cardiovascular: Normal rate, regular rhythm.  No murmurs, rubs, or gallops.  Respiratory: Normal respiratory effort without tachypnea nor retractions. Breath sounds are clear and equal bilaterally. No wheezes/rales/rhonchi. Gastrointestinal: Soft and non tender. No rebound. No guarding.  Genitourinary: Deferred Musculoskeletal: Normal range of motion in all extremities. No lower extremity edema. Neurologic:  Normal speech and  language. No gross focal neurologic deficits are appreciated.  Skin:  Skin is warm, dry and intact. No rash noted. Psychiatric: Mood and affect are normal. Speech and behavior are normal. Patient exhibits appropriate insight and judgment.  ____________________________________________    LABS (pertinent positives/negatives)  Trop <0.03 BMP na 138, k 4.8, glu 104, cr 1.64 CBC wbc 8.3, hgb 11.6, plt 339 Trop <0.03 x 2  ____________________________________________   EKG  I, Nance Pear, attending physician, personally viewed and interpreted this EKG  EKG Time: 1552 Rate: 74 Rhythm: sinus rhythm Axis: left axis deviation Intervals: qtc 484 QRS: LBBB ST changes: no st elevation equivilaent Impression: abnormal ekg   ____________________________________________    RADIOLOGY  None  ____________________________________________   PROCEDURES  Procedures  ____________________________________________   INITIAL IMPRESSION / ASSESSMENT AND PLAN / ED COURSE  Pertinent labs & imaging results that were available during my care of the patient were reviewed by me and considered in my medical decision making (see chart for details).   Presented to the emergency department today because of the sensation of electrical shock going through his body.  Unclear etiology.  Would consider electrolyte abnormalities, additionally would consider potential heart issue.  Patient's work-up including 2 sets of troponin without concerning findings.  Discussed with patient importance of following up with primary care.   ____________________________________________   FINAL CLINICAL IMPRESSION(S) / ED DIAGNOSES  Final diagnoses:  Sensation problem     Note: This dictation was prepared with Dragon dictation. Any transcriptional errors that result from this process are unintentional     Nance Pear, MD 11/21/17 2023

## 2017-11-21 NOTE — ED Triage Notes (Signed)
Pt arrived via [facility] from home with complaint of feeling a shock on right side and then weakness in the right arm. EMS states pt was sitting at home when he felt a shock go through body like a electric fence. Pt AxOx4. No distress noted.

## 2017-12-04 ENCOUNTER — Encounter: Payer: Self-pay | Admitting: Emergency Medicine

## 2017-12-04 ENCOUNTER — Other Ambulatory Visit: Payer: Self-pay

## 2017-12-04 ENCOUNTER — Emergency Department
Admission: EM | Admit: 2017-12-04 | Discharge: 2017-12-04 | Disposition: A | Discharge status: Home / Self Care | Payer: BLUE CROSS/BLUE SHIELD | Attending: Emergency Medicine | Admitting: Emergency Medicine

## 2017-12-04 ENCOUNTER — Emergency Department: Payer: BLUE CROSS/BLUE SHIELD

## 2017-12-04 DIAGNOSIS — Z79899 Other long term (current) drug therapy: Secondary | ICD-10-CM | POA: Diagnosis not present

## 2017-12-04 DIAGNOSIS — I10 Essential (primary) hypertension: Secondary | ICD-10-CM | POA: Insufficient documentation

## 2017-12-04 DIAGNOSIS — R42 Dizziness and giddiness: Secondary | ICD-10-CM | POA: Diagnosis not present

## 2017-12-04 DIAGNOSIS — R251 Tremor, unspecified: Secondary | ICD-10-CM

## 2017-12-04 LAB — COMPREHENSIVE METABOLIC PANEL
ALBUMIN: 4.3 g/dL (ref 3.5–5.0)
ALK PHOS: 89 U/L (ref 38–126)
ALT: 37 U/L (ref 0–44)
AST: 54 U/L — AB (ref 15–41)
Anion gap: 10 (ref 5–15)
BILIRUBIN TOTAL: 0.9 mg/dL (ref 0.3–1.2)
BUN: 37 mg/dL — ABNORMAL HIGH (ref 6–20)
CO2: 23 mmol/L (ref 22–32)
Calcium: 9.4 mg/dL (ref 8.9–10.3)
Chloride: 104 mmol/L (ref 98–111)
Creatinine, Ser: 1.67 mg/dL — ABNORMAL HIGH (ref 0.61–1.24)
GFR calc Af Amer: 51 mL/min — ABNORMAL LOW (ref >60)
GFR, EST NON AFRICAN AMERICAN: 44 mL/min — AB (ref >60)
Glucose, Bld: 103 mg/dL — ABNORMAL HIGH (ref 70–99)
Potassium: 4.8 mmol/L (ref 3.5–5.1)
Sodium: 137 mmol/L (ref 135–145)
Total Protein: 7.9 g/dL (ref 6.5–8.1)

## 2017-12-04 LAB — CBC
HEMATOCRIT: 33.1 % — AB (ref 40.0–52.0)
HEMOGLOBIN: 11.6 g/dL — AB (ref 13.0–18.0)
MCH: 30.7 pg (ref 26.0–34.0)
MCHC: 35 g/dL (ref 32.0–36.0)
MCV: 88 fL (ref 80.0–100.0)
Platelets: 314 10*3/uL (ref 150–440)
RBC: 3.76 MIL/uL — AB (ref 4.40–5.90)
RDW: 12.9 % (ref 11.5–14.5)
WBC: 7.3 10*3/uL (ref 3.8–10.6)

## 2017-12-04 LAB — TROPONIN I

## 2017-12-04 MED ORDER — SODIUM CHLORIDE 0.9 % IV SOLN
1000.0000 mL | Freq: Once | INTRAVENOUS | Status: AC
  Administered 2017-12-04: 1000 mL via INTRAVENOUS

## 2017-12-04 NOTE — ED Notes (Signed)
Pt returned from CT at this time.  

## 2017-12-04 NOTE — ED Notes (Signed)
Pt taken to POV in a WC. VSS. NAD. Discharge instructions, and follow up reviewed. All questions and concerns addressed.

## 2017-12-04 NOTE — ED Provider Notes (Signed)
[facility] Emergency Department Provider Note   ____________________________________________    I have reviewed the triage vital signs and the nursing notes.   HISTORY  Chief Complaint Tremors and Dizziness     HPI Edward Pitts is a 57 y.o. male who presents with complaints of dizziness and tremors.  Patient reports he was at work today when he began having tremors in his hands bilaterally as well as dizziness.  He denies headache or neuro deficits.  He has had this a couple of times before.  Seen in the emergency department 1 week ago for similar complaints.  Denies chest pain.  No nausea or vomiting.  Has a neurology appointment in 2 days to further evaluate these complaints.  Currently reports he feels much better and is asymptomatic  Past Medical History:  Diagnosis Date  . Hyperlipidemia   . Hypertension     Patient Active Problem List   Diagnosis Date Noted  . Stable angina ([place]) 06/27/2017    Past Surgical History:  Procedure Laterality Date  . KNEE SURGERY Left   . LEFT HEART CATH AND CORONARY ANGIOGRAPHY Left 06/29/2017   Procedure: LEFT HEART CATH AND CORONARY ANGIOGRAPHY;  Surgeon: [name], MD;  Location: [facility];  Service: Cardiovascular;  Laterality: Left;    Prior to Admission medications   Medication Sig Start Date End Date Taking? Authorizing Provider  atorvastatin (LIPITOR) 40 MG tablet Take 40 mg by mouth at bedtime.   Yes [provider]  hydrochlorothiazide (HYDRODIURIL) 25 MG tablet Take 25 mg by mouth daily.   Yes [provider]  hydrocortisone 2.5 % cream Apply 1 application topically daily as needed (for itching).    Yes [provider]  ketoconazole (NIZORAL) 2 % cream Apply 1 application topically daily as needed for irritation.    Yes [provider]  lisinopril (PRINIVIL,ZESTRIL) 10 MG tablet Take 10 mg by mouth daily.   Yes [provider]    meloxicam (MOBIC) 7.5 MG tablet Take 7.5 mg by mouth 2 (two) times daily as needed for pain.    Yes [provider]  metoprolol succinate (TOPROL-XL) 25 MG 24 hr tablet Take 25 mg by mouth daily.   Yes [provider]  Multiple Vitamins-Minerals (CENTRUM SILVER PO) Take 1 tablet by mouth daily.   Yes [provider]     Allergies Patient has no known allergies.  History reviewed. No pertinent family history.  Social History Social History   Tobacco Use  . Smoking status: Never Smoker  . Smokeless tobacco: Never Used  Substance Use Topics  . Alcohol use: No  . Drug use: No    Review of Systems  Constitutional: As above Eyes: No visual changes.  ENT: No sore throat. Cardiovascular: Denies chest pain. Respiratory: Denies shortness of breath. Gastrointestinal: No abdominal pain.  Genitourinary: Negative for dysuria. Musculoskeletal: Negative for back pain. Skin: Negative for rash. Neurological: As above   ____________________________________________   PHYSICAL EXAM:  VITAL SIGNS: ED Triage Vitals  Enc Vitals Group     BP 12/04/17 0925 (!) 147/75     Pulse Rate 12/04/17 0925 66     Resp 12/04/17 0925 18     Temp 12/04/17 0925 98.1 F (36.7 C)     Temp Source 12/04/17 0925 Oral     SpO2 12/04/17 0925 96 %     Weight 12/04/17 0925 102 kg (224 lb 13.9 oz)     Height 12/04/17 0925  1.702 m (5\' 7" )     Head Circumference --      Peak Flow --      Pain Score 12/04/17 0928 0     Pain Loc --      Pain Edu? --      Excl. in [place]? --     Constitutional: Alert and oriented. No acute distress. Pleasant and interactive  Nose: No congestion/rhinnorhea. Mouth/Throat: Mucous membranes are moist.   Neck:  Painless ROM Cardiovascular: Normal rate, regular rhythm. Good peripheral circulation. Respiratory: Normal respiratory effort.  No retractions. Lungs CTAB. Gastrointestinal: Soft and nontender. No distention.  No CVA  tenderness.  Musculoskeletal: No lower extremity tenderness nor edema.  Warm and well perfused Neurologic:  Normal speech and language. No gross focal neurologic deficits are appreciated.  Skin:  Skin is warm, dry and intact. No rash noted. Psychiatric: Mood and affect are normal. Speech and behavior are normal.  ____________________________________________   LABS (all labs ordered are listed, but only abnormal results are displayed)  Labs Reviewed  CBC - Abnormal; Notable for the following components:      Result Value   RBC 3.76 (*)    Hemoglobin 11.6 (*)    HCT 33.1 (*)    All other components within normal limits  COMPREHENSIVE METABOLIC PANEL - Abnormal; Notable for the following components:   Glucose, Bld 103 (*)    BUN 37 (*)    Creatinine, Ser 1.67 (*)    AST 54 (*)    GFR calc non Af Amer 44 (*)    GFR calc Af Amer 51 (*)    All other components within normal limits  TROPONIN I   ____________________________________________  EKG  ED ECG REPORT I, Lavonia Drafts, the attending physician, personally viewed and interpreted this ECG.  Date: 12/04/2017  Rhythm: normal sinus rhythm QRS Axis: normal Intervals: Left bundle branch block, chronic ST/T Wave abnormalities: normal Narrative Interpretation: no evidence of acute ischemia  ____________________________________________  RADIOLOGY  CT head ____________________________________________   PROCEDURES  Procedure(s) performed: No  Procedures   Critical Care performed: No ____________________________________________   INITIAL IMPRESSION / ASSESSMENT AND PLAN / ED COURSE  Pertinent labs & imaging results that were available during my care of the patient were reviewed by me and considered in my medical decision making (see chart for details).  Patient well-appearing in no acute distress, asymptomatic at this time.  Unclear cause of tremors, dizziness.  No chest pain or palpitations to suggest coronary  cause, recent cardiac catheterization was reassuring.  Possibility of focal seizure seems unlikely, will obtain imaging.  We will give IV fluids in the meantime and reevaluate  Imaging is quite reassuring, lab work is unchanged from prior.  Patient feels better after IV fluids.  Has follow-up with neurology in 2 days.  No indication for admission at this time, recommend increase hydration outpatient follow-up    ____________________________________________   FINAL CLINICAL IMPRESSION(S) / ED DIAGNOSES  Final diagnoses:  Dizziness  Occasional tremors        Note:  This document was prepared using Dragon voice recognition software and may include unintentional dictation errors.    Lavonia Drafts, MD 12/04/17 1257

## 2017-12-04 NOTE — ED Triage Notes (Signed)
Pt arrived via [facility] with wife from work with reports of having a spell of shaking and seeing black spots, pt states he can tell when he is going to get a spell and told someone at work. Pt states his head starts spinning. Pt states the spell passed, but states then another one came back again.  Pt seen here in the last 1-2 weeks for same sxs, pt is supposed to see Dr. Jannifer Franklin on Thursday in [place] at [facility].

## 2017-12-04 NOTE — ED Notes (Signed)
Patient transported to CT 

## 2017-12-06 ENCOUNTER — Ambulatory Visit (INDEPENDENT_AMBULATORY_CARE_PROVIDER_SITE_OTHER): Payer: BLUE CROSS/BLUE SHIELD | Admitting: Neurology

## 2017-12-06 ENCOUNTER — Encounter: Payer: Self-pay | Admitting: Neurology

## 2017-12-06 ENCOUNTER — Telehealth: Payer: Self-pay | Admitting: Neurology

## 2017-12-06 VITALS — BP 139/65 | HR 64 | Ht 67.0 in | Wt 223.0 lb

## 2017-12-06 DIAGNOSIS — R202 Paresthesia of skin: Secondary | ICD-10-CM | POA: Diagnosis not present

## 2017-12-06 NOTE — Telephone Encounter (Signed)
[employer] pending faxed clinical notes  °

## 2017-12-06 NOTE — Progress Notes (Signed)
Reason for visit: Paresthesias, tremors  Referring physician: Dr. Philippa Sicks Pitts is a 57 y.o. male  History of present illness:  [name] is a 57 year old right-handed white male with a history of unusual events that began in February 2019.  The patient claims that the very first event occurred while he was going out to his car, he suddenly felt shock sensations going through his head, arms, body, and legs.  The patient collapsed to the ground and had some tremors involving both arms, he did not lose consciousness.  The episode lasted about 2 minutes and then the patient recovered fully.  The patient has had more than 5 such episodes, he went to the emergency room on 21 November 2017 and on 04 December 2017 with similar events.  The patient indicates that he has never lost consciousness, he does not bite his tongue, he does not lose control of the bowels or the bladder.  He comes in with a family member who witnessed one event, she claimed that the patient was able to talk during the episode but the speech was somewhat slurred, the patient appeared to have dilated pupils, but he was fully conscious.  He was shaking on both arms, the patient himself feels shock sensation throughout the body, he has no residual symptoms.  He has been out of work since 10 September, he brings up several times during the evaluation today that he may have to go on medical disability.  He also wonders whether he could get service-connected for this.  The patient may have some dizziness with the above events, he has had one event of what sounds like to vertigo.  The patient may have some blurring of vision and spots in front of the eyes, but no loss of vision.  He denies any neck pain or low back pain.  He may feel weak all over following an event but he does not have any permanent weakness or numbness.  He has had long-term issues with mild gait instability.  He has undergone a CT scan of the brain that is  unremarkable.  He is sent to this office for an evaluation.  Past Medical History:  Diagnosis Date  . Hyperlipidemia   . Hypertension     Past Surgical History:  Procedure Laterality Date  . KNEE SURGERY Left   . LEFT HEART CATH AND CORONARY ANGIOGRAPHY Left 06/29/2017   Procedure: LEFT HEART CATH AND CORONARY ANGIOGRAPHY;  Surgeon: [name], MD;  Location: [facility];  Service: Cardiovascular;  Laterality: Left;    History reviewed. No pertinent family history.  Social history:  reports that he has never smoked. He has never used smokeless tobacco. He reports that he does not drink alcohol or use drugs.  Medications:  Prior to Admission medications   Medication Sig Start Date End Date Taking? Authorizing Provider  atorvastatin (LIPITOR) 40 MG tablet Take 40 mg by mouth at bedtime.   Yes [provider]  hydrochlorothiazide (HYDRODIURIL) 25 MG tablet Take 25 mg by mouth daily.   Yes [provider]  hydrocortisone 2.5 % cream Apply 1 application topically daily as needed (for itching).    Yes [provider]  ketoconazole (NIZORAL) 2 % cream Apply 1 application topically daily as needed for irritation.    Yes [provider]  lisinopril (PRINIVIL,ZESTRIL) 10 MG tablet Take 10 mg by mouth daily.   Yes [provider]  meloxicam (MOBIC) 7.5 MG tablet Take 7.5  mg by mouth 2 (two) times daily as needed for pain.    Yes [provider]  metoprolol succinate (TOPROL-XL) 25 MG 24 hr tablet Take 25 mg by mouth daily.   Yes [provider]  Multiple Vitamins-Minerals (CENTRUM SILVER PO) Take 1 tablet by mouth daily.   Yes [provider]     No Known Allergies  ROS:  Out of a complete 14 system review of symptoms, the patient complains only of the following symptoms, and all other reviewed systems are negative.  Vertigo Blurred vision  Blood pressure 139/65, pulse 64, height 5\' 7"  (1.702 m),  weight 223 lb (101.2 kg).  Physical Exam  General: The patient is alert and cooperative at the time of the examination.  Patient is mildly to moderately obese.  Eyes: Pupils are equal, round, and reactive to light. Discs are flat bilaterally.  Neck: The neck is supple, no carotid bruits are noted.  Respiratory: The respiratory examination is clear.  Cardiovascular: The cardiovascular examination reveals a regular rate and rhythm, no obvious murmurs or rubs are noted.  Skin: Extremities are without significant edema.  Neurologic Exam  Mental status: The patient is alert and oriented x 3 at the time of the examination. The patient has apparent normal recent and remote memory, with an apparently normal attention span and concentration ability.  Cranial nerves: Facial symmetry is present. There is good sensation of the face to pinprick and soft touch bilaterally. The strength of the facial muscles and the muscles to head turning and shoulder shrug are normal bilaterally. Speech is well enunciated, no aphasia or dysarthria is noted. Extraocular movements are full. Visual fields are full. The tongue is midline, and the patient has symmetric elevation of the soft palate. No obvious hearing deficits are noted.  Motor: The motor testing reveals 5 over 5 strength of all 4 extremities. Good symmetric motor tone is noted throughout.  Sensory: Sensory testing is intact to pinprick, soft touch, vibration sensation, and position sense on all 4 extremities, with exception some decrease in position sense in the right foot. No evidence of extinction is noted.  Coordination: Cerebellar testing reveals good finger-nose-finger and heel-to-shin bilaterally.  Gait and station: Gait is slightly wide-based, unsteady.  Tandem gait is unsteady.  Romberg is negative. No drift is seen.  Reflexes: Deep tendon reflexes are symmetric and normal bilaterally. Toes are downgoing bilaterally.   CT head  12/04/17:  IMPRESSION: Negative head CT.  * CT scan images were reviewed online. I agree with the written report.    Assessment/Plan:  1.  Episodes of total body paresthesias, bilateral upper extremity tremors  The patient has unusual events that are unassociated with loss of consciousness.  The events do not sound typical for seizure events as they are associated with bilateral motor events unassociated with loss of consciousness.  The patient does not have altered sensorium during the episodes.  The patient is already talking about going on medical disability.  The patient will be set up for an evaluation, he will have MRI of the brain and cervical spine, he will have blood work done today.  An EEG study will be done.  The patient was given a note to keep him out of work until 24 December 2017.  It is possible that the above events may represent panic attack or other behavioral event.  Jill Alexanders MD 12/06/2017 8:39 AM  [facility] [address] [place], [place] [address]  Phone [phone] Fax  [phone]  

## 2017-12-06 NOTE — Telephone Encounter (Signed)
[employer] Josem Kaufmann: [id number] (exp. 12/06/17 to 01/04/18) order sent to GI. They will reach out to the pt to schedule.

## 2017-12-08 LAB — ENA+DNA/DS+SJORGEN'S
ENA RNP Ab: 0.2 AI (ref 0.0–0.9)
ENA SM Ab Ser-aCnc: <0.2 AI (ref 0.0–0.9)
ENA SSA (RO) Ab: <0.2 AI (ref 0.0–0.9)
ENA SSB (LA) Ab: <0.2 AI (ref 0.0–0.9)
dsDNA Ab: 1 IU/mL (ref 0–9)

## 2017-12-08 LAB — TSH: TSH: 2.72 u[IU]/mL (ref 0.450–4.500)

## 2017-12-08 LAB — SEDIMENTATION RATE: Sed Rate: 10 mm/hr (ref 0–30)

## 2017-12-08 LAB — COPPER, SERUM: COPPER: 138 ug/dL (ref 72–166)

## 2017-12-08 LAB — ANA W/REFLEX: ANA: POSITIVE — AB

## 2017-12-08 LAB — B. BURGDORFI ANTIBODIES: Lyme IgG/IgM Ab: <0.91 {ISR} (ref 0.00–0.90)

## 2017-12-08 LAB — VITAMIN B12: VITAMIN B 12: 788 pg/mL (ref 232–1245)

## 2017-12-09 ENCOUNTER — Telehealth: Payer: Self-pay | Admitting: Neurology

## 2017-12-09 NOTE — Telephone Encounter (Signed)
I called the patient.  The blood work is unremarkable with exception that the ANA is positive, the antibody panel associated with this is completely negative, it is unlikely that the positive ANA has any clinical significance.

## 2017-12-11 NOTE — Telephone Encounter (Signed)
Pt returning Dr. Jannifer Franklin' call, stating he would like to discuss his lab work in greater detail

## 2017-12-11 NOTE — Telephone Encounter (Signed)
I called the patient.  Basically, the patient's blood work was normal, the ANA was positive but the entire antibody panel was negative, this likely has no clinical significance.

## 2017-12-12 ENCOUNTER — Telehealth: Payer: Self-pay | Admitting: Neurology

## 2017-12-12 ENCOUNTER — Ambulatory Visit (INDEPENDENT_AMBULATORY_CARE_PROVIDER_SITE_OTHER): Payer: BLUE CROSS/BLUE SHIELD | Admitting: Neurology

## 2017-12-12 DIAGNOSIS — R202 Paresthesia of skin: Secondary | ICD-10-CM

## 2017-12-12 DIAGNOSIS — R2990 Unspecified symptoms and signs involving the nervous system: Secondary | ICD-10-CM

## 2017-12-12 NOTE — Telephone Encounter (Signed)
I called the patient.  The EEG study was normal.  We will check MRI of the brain and cervical spine.  For now, the patient was instructed not to operate a motor vehicle until further notice.

## 2017-12-12 NOTE — Procedures (Signed)
    History:  Edward Pitts is a 57 year old gentleman with episodes of shock sensations throughout the body and collapse with tremors involving both upper extremities unassociated with loss of consciousness.  The patient is being evaluated for these events.  This is a routine EEG.  No skull defects are noted.  Medications include Lipitor, hydrochlorothiazide, lisinopril, metoprolol, and multivitamins.  EEG classification: Normal awake  Description of the recording: The background rhythms of this recording consists of a fairly well modulated medium amplitude alpha rhythm of 10 Hz that is reactive to eye opening and closure. As the record progresses, the patient appears to remain in the waking state throughout the recording. Photic stimulation was performed, resulting in a bilateral and symmetric photic driving response. Hyperventilation was also performed, resulting in a minimal buildup of the background rhythm activities without significant slowing seen. At no time during the recording does there appear to be evidence of spike or spike wave discharges or evidence of focal slowing. EKG monitor shows no evidence of cardiac rhythm abnormalities with a heart rate of 72.  Impression: This is a normal EEG recording in the waking state. No evidence of ictal or interictal discharges are seen.

## 2017-12-18 ENCOUNTER — Telehealth: Payer: Self-pay | Admitting: *Deleted

## 2017-12-18 DIAGNOSIS — Z0289 Encounter for other administrative examinations: Secondary | ICD-10-CM

## 2017-12-18 NOTE — Telephone Encounter (Signed)
Pt [employer] claim form on [place].

## 2017-12-22 ENCOUNTER — Ambulatory Visit
Admission: RE | Admit: 2017-12-22 | Discharge: 2017-12-22 | Disposition: A | Discharge status: Home / Self Care | Source: Ambulatory Visit | Attending: Neurology | Admitting: Neurology

## 2017-12-22 DIAGNOSIS — R202 Paresthesia of skin: Secondary | ICD-10-CM | POA: Diagnosis not present

## 2017-12-24 ENCOUNTER — Telehealth: Payer: Self-pay | Admitting: Neurology

## 2017-12-24 ENCOUNTER — Encounter: Payer: Self-pay | Admitting: Neurology

## 2017-12-24 MED ORDER — CLONAZEPAM 0.5 MG PO TABS: 0.5000 mg | ORAL_TABLET | Freq: Every day | ORAL | 3 refills | Status: DC

## 2017-12-24 NOTE — Telephone Encounter (Signed)
I called the patient.  I discussed starting him on clonazepam 0.5 mg at night.  I will send in a letter to allow him to go back to work currently.  The letter will be faxed to [phone].

## 2017-12-24 NOTE — Telephone Encounter (Signed)
Pt called back, he needs note to return to work. He will pick it up. Please call to advise

## 2017-12-24 NOTE — Telephone Encounter (Signed)
  I called the patient.  The MRI studies of the brain and cervical spinal cord were essentially normal, very minimal white matter changes seen.  The patient may be having behavior episodes or panic attacks, I will place the patient on low-dose clonazepam taking 0.5 mg at night.  He is cleared to return to work in full capacity at this point.   MRI brain 12/23/17:  IMPRESSION: This MRI of the brain without contrast shows the following: 1.     Very mild extent of T2/FLAIR hyperintense foci in the periatrial white matter most consistent with minimal chronic microvascular ischemic change.    The pattern is less consistent with demyelination.    2.     There are no acute findings.    MRI cerviclal 12/23/17:   IMPRESSION: This MRI of the cervical spine without contrast shows the following: 1.     The spinal cord appears normal. 2.     At C5-C6, there is minimal spinal stenosis, moderately severe right foraminal narrowing and moderate left foraminal narrowing.  There is potential for right C6 nerve root compression. 3.     There are no acute findings.

## 2017-12-26 NOTE — Telephone Encounter (Signed)
Forms faxed to [place] claims at [phone].

## 2017-12-26 NOTE — Telephone Encounter (Signed)
Gave completed/signed forms back to medical records to process for pt.   

## 2018-01-03 ENCOUNTER — Emergency Department: Payer: BLUE CROSS/BLUE SHIELD

## 2018-01-03 ENCOUNTER — Emergency Department
Admission: EM | Admit: 2018-01-03 | Discharge: 2018-01-03 | Disposition: A | Discharge status: Home / Self Care | Payer: BLUE CROSS/BLUE SHIELD | Attending: Emergency Medicine | Admitting: Emergency Medicine

## 2018-01-03 DIAGNOSIS — Z79899 Other long term (current) drug therapy: Secondary | ICD-10-CM | POA: Insufficient documentation

## 2018-01-03 DIAGNOSIS — I10 Essential (primary) hypertension: Secondary | ICD-10-CM | POA: Diagnosis not present

## 2018-01-03 DIAGNOSIS — M25532 Pain in left wrist: Secondary | ICD-10-CM | POA: Diagnosis present

## 2018-01-03 DIAGNOSIS — M25432 Effusion, left wrist: Secondary | ICD-10-CM | POA: Diagnosis not present

## 2018-01-03 MED ORDER — COLCHICINE 0.6 MG PO TABS
1.2000 mg | ORAL_TABLET | Freq: Once | ORAL | Status: AC
  Administered 2018-01-03: 1.2 mg via ORAL
  Filled 2018-01-03: qty 2

## 2018-01-03 MED ORDER — PREDNISONE 20 MG PO TABS
60.0000 mg | ORAL_TABLET | Freq: Once | ORAL | Status: AC
  Administered 2018-01-03: 60 mg via ORAL
  Filled 2018-01-03: qty 3

## 2018-01-03 MED ORDER — IBUPROFEN 600 MG PO TABS: 600.0000 mg | ORAL_TABLET | Freq: Three times a day (TID) | ORAL | 0 refills | Status: DC | PRN

## 2018-01-03 MED ORDER — IBUPROFEN 600 MG PO TABS
600.0000 mg | ORAL_TABLET | Freq: Once | ORAL | Status: AC
  Administered 2018-01-03: 600 mg via ORAL
  Filled 2018-01-03: qty 1

## 2018-01-03 NOTE — ED Provider Notes (Signed)
[facility] Emergency Department Provider Note  ____________________________________________   First MD Initiated Contact with Patient 01/03/18 0310     (approximate)  I have reviewed the triage vital signs and the nursing notes.   HISTORY  Chief Complaint Wrist Pain   HPI Edward Pitts is a 57 y.o. male who self presents to the emergency department with 2 days of atraumatic left posterior wrist pain.  The patient is right-hand dominant and works as a Arts administrator.  He has no history of the same.  No fevers or chills.  No dental work in multiple years.  No numbness or weakness.  The pain is throbbing aching moderate to severe worse when ranging his hand and improved when not.    Past Medical History:  Diagnosis Date  . Hyperlipidemia   . Hypertension     Patient Active Problem List   Diagnosis Date Noted  . 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 KNEE SURGERY Left   . LEFT HEART CATH AND CORONARY ANGIOGRAPHY Left 06/29/2017   Procedure: LEFT HEART CATH AND CORONARY ANGIOGRAPHY;  Surgeon: [name], MD;  Location: [facility];  Service: Cardiovascular;  Laterality: Left;    Prior to Admission medications   Medication Sig Start Date End Date Taking? Authorizing Provider  atorvastatin (LIPITOR) 40 MG tablet Take 40 mg by mouth at bedtime.    [provider]  clonazePAM (KLONOPIN) 0.5 MG tablet Take 1 tablet (0.5 mg total) by mouth at bedtime. 12/24/17   Kathrynn Ducking, MD  hydrochlorothiazide (HYDRODIURIL) 25 MG tablet Take 25 mg by mouth daily.    [provider]  hydrocortisone 2.5 % cream Apply 1 application topically daily as needed (for itching).     [provider]  ibuprofen (ADVIL,MOTRIN) 600 MG tablet Take 1 tablet (600 mg total) by mouth every 8 (eight) hours as needed. 01/03/18   Darel Hong, MD  ketoconazole (NIZORAL) 2 % cream Apply 1 application  topically daily as needed for irritation.     [provider]  lisinopril (PRINIVIL,ZESTRIL) 10 MG tablet Take 10 mg by mouth daily.    [provider]  meloxicam (MOBIC) 7.5 MG tablet Take 7.5 mg by mouth 2 (two) times daily as needed for pain.     [provider]  metoprolol succinate (TOPROL-XL) 25 MG 24 hr tablet Take 25 mg by mouth daily.    [provider]  Multiple Vitamins-Minerals (CENTRUM SILVER PO) Take 1 tablet by mouth daily.    [provider]    Allergies Patient has no known allergies.  No family history on file.  Social History Social History   Tobacco Use  . Smoking status: Never Smoker  . Smokeless tobacco: Never Used  Substance Use Topics  . Alcohol use: No  . Drug use: No    Review of Systems Constitutional: No fever/chills ENT: Denies dental pain Cardiovascular: Denies chest pain. Respiratory: Denies shortness of breath. Musculoskeletal: Positive for left wrist pain Neurological: Negative for headaches   ____________________________________________   PHYSICAL EXAM:  VITAL SIGNS: ED Triage Vitals  Enc Vitals Group     BP 01/03/18 0213 (!) 158/63     Pulse Rate 01/03/18 0213 77     Resp 01/03/18 0213 18     Temp 01/03/18 0213 98.7 F (37.1 C)     Temp Source 01/03/18 0213 Oral     SpO2 01/03/18 0213 96 %  Weight 01/03/18 0213 225 lb (102.1 kg)     Height 01/03/18 0213 5\' 7"  (1.702 m)     Head Circumference --      Peak Flow --      Pain Score 01/03/18 0218 10     Pain Loc --      Pain Edu? --      Excl. in [place]? --     Constitutional: Alert and oriented x4 appears obviously uncomfortable nontoxic no diaphoresis speaks in full clear sentences MSK: Small left wrist effusion and discomfort when ranging the wrist.  Neurovascularly intact Neurologic:  Normal speech and language. No gross focal neurologic deficits are appreciated.  Skin:  Skin is warm, dry and intact. No rash  noted.    ____________________________________________  LABS (all labs ordered are listed, but only abnormal results are displayed)  Labs Reviewed - No data to display   __________________________________________  EKG   ____________________________________________  RADIOLOGY  X-ray of the left wrist reviewed by me shows chronic changes ____________________________________________   DIFFERENTIAL includes but not limited to  Gout, osteoarthritis, rheumatoid arthritis, septic wrist   PROCEDURES  Procedure(s) performed: no  Procedures  Critical Care performed: no  ____________________________________________   INITIAL IMPRESSION / ASSESSMENT AND PLAN / ED COURSE  Pertinent labs & imaging results that were available during my care of the patient were reviewed by me and considered in my medical decision making (see chart for details).   As part of my medical decision making, I reviewed the following data within the [place] History obtained from family if available, nursing notes, old chart and ekg, as well as notes from prior ED visits.  The patient has a swollen tender left wrist with a small effusion that is not large enough to tap.  He feels improved after nonsteroidals.  Placed in a wrist splint with improvement in his symptoms.  Explained to the patient the diagnostic uncertainty and the importance of follow-up with orthopedic surgery as well as primary care.  Strict return precautions were given.      ____________________________________________   FINAL CLINICAL IMPRESSION(S) / ED DIAGNOSES  Final diagnoses:  Effusion of left wrist      NEW MEDICATIONS STARTED DURING THIS VISIT:  Discharge Medication List as of 01/03/2018  4:53 AM    START taking these medications   Details  ibuprofen (ADVIL,MOTRIN) 600 MG tablet Take 1 tablet (600 mg total) by mouth every 8 (eight) hours as needed., Starting Thu 01/03/2018, Print          Note:  This document was prepared using Dragon voice recognition software and may include unintentional dictation errors.      Darel Hong, MD 01/03/18 [phone]

## 2018-01-03 NOTE — ED Triage Notes (Signed)
Patient c/o left wrist pain and swelling. Patient denies injury. Patient denies hx of the same.

## 2018-01-03 NOTE — Discharge Instructions (Signed)
Please wear your wrist splint at all times until your pain improves.  Please make an appointment to follow-up with the orthopedic surgeon for reevaluation and return to the emergency department for any concerns.  It was a pleasure to take care of you today, and thank you for coming to our emergency department.  If you have any questions or concerns before leaving please ask the nurse to grab me and I'm more than happy to go through your aftercare instructions again.  If you were prescribed any opioid pain medication today such as Norco, Vicodin, Percocet, morphine, hydrocodone, or oxycodone please make sure you do not drive when you are taking this medication as it can alter your ability to drive safely.  If you have any concerns once you are home that you are not improving or are in fact getting worse before you can make it to your follow-up appointment, please do not hesitate to call [phone] and come back for further evaluation.  Edward Hong, MD  Results for orders placed or performed in visit on 12/06/17  Vitamin B12  Result Value Ref Range   Vitamin B-12 788 232 - 1,245 pg/mL  TSH  Result Value Ref Range   TSH 2.720 0.450 - 4.500 uIU/mL  Sedimentation rate  Result Value Ref Range   Sed Rate 10 0 - 30 mm/hr  Copper, serum  Result Value Ref Range   Copper 138 72 - 166 ug/dL  ANA w/Reflex  Result Value Ref Range   Anti Nuclear Antibody(ANA) Positive (A) Negative  B. burgdorfi antibodies  Result Value Ref Range   Lyme IgG/IgM Ab <0.91 0.00 - 0.90 ISR  ENA+DNA/DS+Sjorgen's  Result Value Ref Range   dsDNA Ab 1 0 - 9 IU/mL   ENA RNP Ab 0.2 0.0 - 0.9 AI   ENA SM Ab Ser-aCnc <0.2 0.0 - 0.9 AI   ENA SSA (RO) Ab <0.2 0.0 - 0.9 AI   ENA SSB (LA) Ab <0.2 0.0 - 0.9 AI   See below: Comment    Dg Wrist Complete Left  Result Date: 01/03/2018 CLINICAL DATA:  Left wrist pain and swelling. No injury EXAM: LEFT WRIST - COMPLETE 3+ VIEW COMPARISON:  None. FINDINGS: Degenerative changes in the  radiocarpal, STT, and first carpometacarpal joints of the left wrist. No evidence of acute fracture or dislocation. No focal bone lesion or bone destruction. Soft tissues are unremarkable. IMPRESSION: Degenerative changes in the left wrist. No acute bony abnormalities. Electronically Signed   By: Lucienne Capers M.D.   On: 01/03/2018 02:38   Ct Head Wo Contrast  Result Date: 12/04/2017 CLINICAL DATA:  Seizure. Multiple recent episodes of shaking and visual disturbance with dizziness. EXAM: CT HEAD WITHOUT CONTRAST TECHNIQUE: Contiguous axial images were obtained from the base of the skull through the vertex without intravenous contrast. COMPARISON:  None. FINDINGS: Brain: There is no evidence of acute infarct, intracranial hemorrhage, mass, midline shift, or extra-axial fluid collection. The ventricles and sulci are normal. Vascular: No hyperdense vessel. Skull: No fracture or focal osseous lesion. Sinuses/Orbits: Visualized paranasal sinuses and mastoid air cells are clear. Visualized orbits are unremarkable. Other: None. IMPRESSION: Negative head CT. Electronically Signed   By: Logan Bores M.D.   On: 12/04/2017 11:50   Mr Brain Wo Contrast  Result Date: 12/23/2017  [facility] [address] [place], [place] [address] [phone] NEUROIMAGING REPORT STUDY DATE: 12/22/2017 PATIENT NAME: Edward Pitts DOB: March 30, 1960 MRN: [id number] EXAM: MRI Brain without contrast ORDERING CLINICIAN: Juanda Crumble  Loreta Ave, MD CLINICAL HISTORY: 57 year old man with paresthesias COMPARISON FILMS: None TECHNIQUE: MRI of the brain without contrast was obtained utilizing 5 mm axial slices with T1, T2, T2 flair, SWI and diffusion weighted views.  T1 sagittal and T2 coronal views were obtained. CONTRAST: none IMAGING SITE: [facility], [employer], [place] FINDINGS: On sagittal images, the spinal cord is imaged caudally to C3 and is normal in caliber.   The contents of the posterior fossa are of  normal size and position.   The pituitary gland and optic chiasm appear normal.    Brain volume appears normal.   The ventricles are normal in size and without distortion.  There are no abnormal extra-axial collections of fluid.  The cerebellum and brainstem appears normal.   The deep gray matter appears normal.  In the hemispheres, there are a couple T2/FLAIR hypertense foci in the periatrial white matter.  None of these appear to be acute..  Diffusion weighted images are normal.  Susceptibility weighted images are normal.  The orbits appear normal.   The VIIth/VIIIth nerve complex appears normal.  The mastoid air cells appear normal.  The paranasal sinuses appear normal.  Flow voids are identified within the major intracerebral arteries.     This MRI of the brain without contrast shows the following: 1.     Very mild extent of T2/FLAIR hyperintense foci in the periatrial white matter most consistent with minimal chronic microvascular ischemic change.    The pattern is less consistent with demyelination.   2.     There are no acute findings. INTERPRETING PHYSICIAN: Richard A. Felecia Shelling, MD, PhD, FAAN Certified in  Neuroimaging by [employer]   Mr Cervical Spine Wo Contrast  Result Date: 12/23/2017  [facility] [address] [place], [place] [address] [phone] NEUROIMAGING REPORT STUDY DATE: 12/22/2017 PATIENT NAME: Edward Pitts DOB: July 02, 1960 MRN: [id number] EXAM: MRI of the cervical spine without contrast ORDERING CLINICIAN: Kathrynn Ducking, MD CLINICAL HISTORY: 57 year old man with paresthesias COMPARISON FILMS: None TECHNIQUE: MRI of the cervical spine was obtained utilizing 3 mm sagittal slices from the posterior fossa down to the T2 level with T1, T2 and inversion recovery views. In addition 4 mm axial slices from D9-8 down to T1-2 level were included with T2 and gradient echo views. CONTRAST: None IMAGING SITE: [facility], [place],  [place], [place] FINDINGS: :  On sagittal images, the spine is imaged from above the cervicomedullary junction to T2.  Visible brain appears normal.  Paravertebral soft tissue appears normal.  The spinal cord is of normal caliber and signal.   The vertebral bodies are normally aligned.   The vertebral bodies have normal signal.  The discs and interspaces were further evaluated on axial views from C2 to T1 as follows: C2 - C3:  The disc and interspace appear normal. C3 - C4:  The disc and interspace appear normal. C4 - C5:  The disc and interspace appear normal. C5 - C6: There is a right paramedian disc protrusion and uncovertebral spurring causing borderline spinal stenosis and moderately severe right foraminal narrowing and moderate left foraminal narrowing.  There is potential for right C6 nerve root compression. C6 - C7:  The disc and interspace appear normal. C7 - T1:  The disc and interspace appear normal. T1 - T2:  The disc and interspace appear normal   This MRI of the cervical spine without contrast shows the following: 1.     The spinal  cord appears normal. 2.     At C5-C6, there is minimal spinal stenosis, moderately severe right foraminal narrowing and moderate left foraminal narrowing.  There is potential for right C6 nerve root compression. 3.     There are no acute findings. INTERPRETING PHYSICIAN: Richard A. Felecia Shelling, MD, PhD, FAAN Certified in  [place] by [employer]

## 2018-01-31 ENCOUNTER — Other Ambulatory Visit: Payer: Self-pay | Admitting: Sports Medicine

## 2018-01-31 DIAGNOSIS — M25432 Effusion, left wrist: Secondary | ICD-10-CM

## 2018-01-31 DIAGNOSIS — M19032 Primary osteoarthritis, left wrist: Secondary | ICD-10-CM

## 2018-01-31 DIAGNOSIS — M1812 Unilateral primary osteoarthritis of first carpometacarpal joint, left hand: Secondary | ICD-10-CM

## 2018-01-31 DIAGNOSIS — M25532 Pain in left wrist: Principal | ICD-10-CM

## 2018-01-31 DIAGNOSIS — M659 Synovitis and tenosynovitis, unspecified: Secondary | ICD-10-CM

## 2018-01-31 DIAGNOSIS — R768 Other specified abnormal immunological findings in serum: Secondary | ICD-10-CM

## 2018-02-26 ENCOUNTER — Ambulatory Visit

## 2018-03-05 ENCOUNTER — Encounter: Payer: Self-pay | Admitting: Neurology

## 2018-03-05 ENCOUNTER — Ambulatory Visit (INDEPENDENT_AMBULATORY_CARE_PROVIDER_SITE_OTHER): Payer: BLUE CROSS/BLUE SHIELD | Admitting: Neurology

## 2018-03-05 VITALS — BP 132/68 | HR 73 | Ht 67.0 in | Wt 224.0 lb

## 2018-03-05 DIAGNOSIS — R404 Transient alteration of awareness: Secondary | ICD-10-CM | POA: Diagnosis not present

## 2018-03-05 DIAGNOSIS — R768 Other specified abnormal immunological findings in serum: Secondary | ICD-10-CM | POA: Insufficient documentation

## 2018-03-05 DIAGNOSIS — M25432 Effusion, left wrist: Secondary | ICD-10-CM | POA: Insufficient documentation

## 2018-03-05 MED ORDER — CLONAZEPAM 1 MG PO TABS: 1.0000 mg | ORAL_TABLET | Freq: Every day | ORAL | 1 refills | Status: DC

## 2018-03-05 NOTE — Progress Notes (Signed)
Reason for visit: Shaking episodes  Edward Pitts is an 57 y.o. male  History of present illness:  Edward Pitts is a 57 year old right-handed white male with a history of episodes of shock sensations that would cause him to fall to the ground and shake on all fours without loss of consciousness.  The episodes were felt to be nonorganic in nature, the patient underwent an EEG study and MRI of the brain that were unremarkable.  The patient has been placed on clonazepam, he has had cessation of the shaking events since being on the medication.  He did quite well on the medication without any problems until about 6 weeks ago when he began having brief episodes of "zoning out".  The episodes may occur with sitting or standing.  The episodes will last about 15 minutes, the patient no longer falls to the ground or shakes.  The patient is back to work, but he is on light duty secondary to a tendinopathy of the left hand and wrist.  He is tolerating the clonazepam fairly well, he claims that he does snore at night and some nights he does not sleep well.  He returns to this office for an evaluation.  Past Medical History:  Diagnosis Date  . Hyperlipidemia   . Hypertension     Past Surgical History:  Procedure Laterality Date  . KNEE SURGERY Left   . LEFT HEART CATH AND CORONARY ANGIOGRAPHY Left 06/29/2017   Procedure: LEFT HEART CATH AND CORONARY ANGIOGRAPHY;  Surgeon: Edward Skains, MD;  Location: [facility];  Service: Cardiovascular;  Laterality: Left;    No family history on file.  Social history:  reports that he has never smoked. He has never used smokeless tobacco. He reports that he does not drink alcohol or use drugs.   No Known Allergies  Medications:  Prior to Admission medications   Medication Sig Start Date End Date Taking? Authorizing Provider  atorvastatin (LIPITOR) 40 MG tablet Take 40 mg by mouth at bedtime.   Yes [provider]  clonazePAM (KLONOPIN) 0.5  MG tablet Take 1 tablet (0.5 mg total) by mouth at bedtime. 12/24/17  Yes Kathrynn Ducking, MD  hydrocortisone 2.5 % cream Apply 1 application topically daily as needed (for itching).    Yes [provider]  ketoconazole (NIZORAL) 2 % cream Apply 1 application topically daily as needed for irritation.    Yes [provider]  lisinopril (PRINIVIL,ZESTRIL) 10 MG tablet Take 10 mg by mouth daily.   Yes [provider]  metoprolol succinate (TOPROL-XL) 25 MG 24 hr tablet Take 25 mg by mouth daily.   Yes [provider]  Multiple Vitamins-Minerals (CENTRUM SILVER PO) Take 1 tablet by mouth daily.   Yes [provider]    ROS:  Out of a complete 14 system review of symptoms, the patient complains only of the following symptoms, and all other reviewed systems are negative.  Left hand pain  Blood pressure 132/68, pulse 73, height 5\' 7"  (1.702 m), weight 224 lb (101.6 kg).  Physical Exam  General: The patient is alert and cooperative at the time of the examination.  Skin: No significant peripheral edema is noted in the legs, but the patient appears to have some slight swelling of the left hand.   Neurologic Exam  Mental status: The patient is alert and oriented x 3 at the time of the examination. The patient has apparent normal recent and remote memory, with an apparently  normal attention span and concentration ability.   Cranial nerves: Facial symmetry is present. Speech is normal, no aphasia or dysarthria is noted. Extraocular movements are full. Visual fields are full.  Motor: The patient has good strength in all 4 extremities.  Sensory examination: Soft touch sensation is symmetric on the face, arms, and legs.  Coordination: The patient has good finger-nose-finger and heel-to-shin bilaterally.  Gait and station: The patient has a normal gait. Tandem gait is slightly unsteady. Romberg is negative. No drift is seen.  Reflexes: Deep tendon  reflexes are symmetric.   EEG 12/12/17:  Impression: This is a normal EEG recording in the waking state. No evidence of ictal or interictal discharges are seen.   MRI brain 12/23/17:  IMPRESSION: This MRI of the brain without contrast shows the following: 1. Very mild extent of T2/FLAIR hyperintense foci in the periatrial white matter most consistent with minimal chronic microvascular ischemic change. The pattern is less consistent with demyelination.  2. There are no acute findings.    MRI cervical 12/23/17:   IMPRESSION: This MRI of the cervical spine without contrast shows the following: 1. The spinal cord appears normal. 2. At C5-C6, there is minimal spinal stenosis, moderately severe right foraminal narrowing and moderate left foraminal narrowing. There is potential for right C6 nerve root compression. 3. There are no acute findings.  * MRI scan images were reviewed online. I agree with the written report.    Assessment/Plan:  1.  Shaking episodes, resolved  The patient is now having episodes of "zoning out" that may last 15 seconds, he may have 2 or 3 such episodes a week, the episode started 6 weeks ago.  The clonazepam seems to have improved to the shaking events and shock sensations, he will go up on the dose to 1 mg in the evening.  He will follow-up in 6 months.  A prescription for the 1 mg clonazepam tablets was sent in.  Edward Alexanders MD 03/05/2018 9:55 AM  [facility] [address] [place], [place] [address]  Phone [phone] Fax [phone]

## 2018-03-13 DIAGNOSIS — M1A00X0 Idiopathic chronic gout, unspecified site, without tophus (tophi): Secondary | ICD-10-CM | POA: Insufficient documentation

## 2018-03-27 HISTORY — PX: SUPERFICIAL LYMPH NODE BIOPSY / EXCISION: SUR127

## 2018-05-21 ENCOUNTER — Telehealth: Payer: Self-pay | Admitting: Neurology

## 2018-05-21 ENCOUNTER — Ambulatory Visit (INDEPENDENT_AMBULATORY_CARE_PROVIDER_SITE_OTHER): Payer: BLUE CROSS/BLUE SHIELD | Admitting: Neurology

## 2018-05-21 ENCOUNTER — Encounter: Payer: Self-pay | Admitting: Neurology

## 2018-05-21 VITALS — BP 122/86 | HR 65 | Ht 67.0 in | Wt 231.3 lb

## 2018-05-21 DIAGNOSIS — R55 Syncope and collapse: Secondary | ICD-10-CM

## 2018-05-21 NOTE — Telephone Encounter (Signed)
I contacted the patient and advised Dr. Jannifer Franklin could see him today at 3:00 pm check in time of 2:30 and patient was agreeable.

## 2018-05-21 NOTE — Progress Notes (Signed)
Reason for visit: Near syncopal events  Less Woolsey is an 58 y.o. male  History of present illness:  Mr. Duer is a 58 year old right-handed white male with a history of several recent events of near syncope.  The patient had events on the 21st, 22nd, and 19 May 2018.  The first episode occurred when he stood up from a seated position and then shortly thereafter got dizzy and woozy, he fell back into a chair, he did not black out.  The event lasted about 10 minutes or so.  He did not have vertigo.  He did have some associated shortness of breath, and some sensation that his heart was racing.  The patient denied any chest pain.  He had 2 more events, the last event occurred while sitting in a truck, he had just put gas in the vehicle and then started getting dizzy when he sat down in the truck, the event lasted about 10 to 15 minutes, he had some blurring of vision without loss of vision.  The patient looked slightly flushed, there was no diaphoresis.  The patient reports no focal numbness or weakness of the face, arms, legs.  He feels relatively normal between events.  He was seen by cardiology yesterday, a cardiac monitor was placed.  The patient comes here today for further evaluation.  Past Medical History:  Diagnosis Date  . Hyperlipidemia   . Hypertension     Past Surgical History:  Procedure Laterality Date  . KNEE SURGERY Left   . LEFT HEART CATH AND CORONARY ANGIOGRAPHY Left 06/29/2017   Procedure: LEFT HEART CATH AND CORONARY ANGIOGRAPHY;  Surgeon: [name], MD;  Location: [facility];  Service: Cardiovascular;  Laterality: Left;    History reviewed. No pertinent family history.  Social history:  reports that he has never smoked. He has never used smokeless tobacco. He reports that he does not drink alcohol or use drugs.   No Known Allergies  Medications:  Prior to Admission medications   Medication Sig Start Date End Date Taking? Authorizing Provider   atorvastatin (LIPITOR) 40 MG tablet Take 40 mg by mouth at bedtime.    [provider]  clonazePAM (KLONOPIN) 1 MG tablet Take 1 tablet (1 mg total) by mouth at bedtime. 03/05/18   Kathrynn Ducking, MD  hydrocortisone 2.5 % cream Apply 1 application topically daily as needed (for itching).     [provider]  ketoconazole (NIZORAL) 2 % cream Apply 1 application topically daily as needed for irritation.     [provider]  lisinopril (PRINIVIL,ZESTRIL) 10 MG tablet Take 10 mg by mouth daily.    [provider]  metoprolol succinate (TOPROL-XL) 25 MG 24 hr tablet Take 25 mg by mouth daily.    [provider]  Multiple Vitamins-Minerals (CENTRUM SILVER PO) Take 1 tablet by mouth daily.    [provider]    ROS:  Out of a complete 14 system review of symptoms, the patient complains only of the following symptoms, and all other reviewed systems are negative.  Chills Eye discharge, eye itching, blurred vision Shortness of breath, choking Murmur Nausea Snoring Joint pain, joint swelling, aching muscles, muscle cramps, walking difficulty Dizziness, weakness, tremors  Blood pressure 122/86, pulse 65, height 5\' 7"  (1.702 m), weight 231 lb 5 oz (104.9 kg), SpO2 94 %.   Blood pressure, right arm, standing is 162/76.  Blood pressure, right arm, sitting is 124/70.  Physical Exam  General: The  patient is alert and cooperative at the time of the examination.  The patient is moderately obese.  Skin: No significant peripheral edema is noted.   Neurologic Exam  Mental status: The patient is alert and oriented x 3 at the time of the examination. The patient has apparent normal recent and remote memory, with an apparently normal attention span and concentration ability.   Cranial nerves: Facial symmetry is present. Speech is normal, no aphasia or dysarthria is noted. Extraocular movements are full. Visual fields are full.  Motor: The  patient has good strength in all 4 extremities.  Sensory examination: Soft touch sensation is symmetric on the face, arms, and legs.  Coordination: The patient has good finger-nose-finger and heel-to-shin bilaterally.  Gait and station: The patient has a normal gait. Tandem gait is normal. Romberg is negative. No drift is seen.  Reflexes: Deep tendon reflexes are symmetric.   Assessment/Plan:  1.  Episodic near syncope  The patient has a cardiac monitor in place, we will set him up for a carotid Doppler study.  He will follow-up depending upon the results of the above.  Jill Alexanders MD 05/21/2018 2:57 PM  [facility] [address] [place], [place] [address]  Phone [phone] Fax [phone]

## 2018-05-21 NOTE — Telephone Encounter (Signed)
Pt states he has had several episodes in the last couple of days with dizzy spells and has passed out. Pt would like to be seen soon. Please advise.

## 2018-05-30 ENCOUNTER — Telehealth: Payer: Self-pay

## 2018-05-30 DIAGNOSIS — Z0289 Encounter for other administrative examinations: Secondary | ICD-10-CM

## 2018-05-30 NOTE — Telephone Encounter (Signed)
Patient called back in and stated start date is 05/24/2018 he isnt sure of the end date

## 2018-05-30 NOTE — Telephone Encounter (Signed)
I have added dated of 05/24/18 to [facility] paper work and have place in Dr. Jannifer Franklin office to review/advise.  I did not see from office note on 05/21/18 Dr. Jannifer Franklin mentioning the pt needed to be out of work.  Will see what Dr. Jannifer Franklin recommends.

## 2018-05-30 NOTE — Telephone Encounter (Signed)
I contacted the pt and left vm requesting a call back to verify the start and end date of his FMLA paper work request.

## 2018-05-31 NOTE — Telephone Encounter (Signed)
[employer] form completed by Dr. Jannifer Franklin. Sent to MR for processing.

## 2018-06-03 NOTE — Telephone Encounter (Signed)
I faxed pt [employer] form on 06/03/18 to [phone]

## 2018-06-04 DIAGNOSIS — R001 Bradycardia, unspecified: Secondary | ICD-10-CM | POA: Insufficient documentation

## 2018-06-12 DIAGNOSIS — M19032 Primary osteoarthritis, left wrist: Secondary | ICD-10-CM | POA: Insufficient documentation

## 2018-06-13 ENCOUNTER — Telehealth: Payer: Self-pay | Admitting: Neurology

## 2018-06-13 NOTE — Telephone Encounter (Signed)
Called and LVM asking for patient to call us back in regards to [facility] paperwork and paying $50 prior to doctor filling it out.

## 2018-06-20 ENCOUNTER — Ambulatory Visit (HOSPITAL_COMMUNITY): Admission: RE | Admit: 2018-06-20 | Source: Ambulatory Visit

## 2018-06-27 ENCOUNTER — Telehealth: Payer: Self-pay | Admitting: Neurology

## 2018-06-27 MED ORDER — CLONAZEPAM 0.5 MG PO TABS: ORAL_TABLET | ORAL | 3 refills | Status: DC

## 2018-06-27 NOTE — Addendum Note (Signed)
Addended by: Kathrynn Ducking on: 06/27/2018 03:45 PM   Modules accepted: Orders

## 2018-06-27 NOTE — Telephone Encounter (Signed)
I called the patient.  The patient is started back with the original episodes where he will feel like a shock going through his body start in his head going down to the arms and legs, he will then drop to the ground and tremor on both sides without loss of consciousness.  He has had a cardiac monitor study that he claims was normal.  Initially, the clonazepam did help some these episodes, we will go up on the dose taking 0.5 mg in the morning and 1 mg in the evening.  I will send in a prescription.

## 2018-06-27 NOTE — Telephone Encounter (Signed)
Pt is asking for a call to discuss dizziness & feeling of electricity running thru his body.  Pt has accepted Dr Jannifer Franklin 1st available appointment and is on wait list.  Pt is asking for a call from RN or Dr Jannifer Franklin to discuss how he is feeling while waiting to be seen.  Pt also wants Dr Jannifer Franklin to be aware that he is on his last refill of clonazePAM (KLONOPIN) 1 MG tablet and if Dr Jannifer Franklin wants him to stay on it he will need a refill [id number]

## 2018-08-01 ENCOUNTER — Telehealth: Payer: Self-pay | Admitting: Neurology

## 2018-08-01 ENCOUNTER — Ambulatory Visit (HOSPITAL_COMMUNITY)
Admission: RE | Admit: 2018-08-01 | Discharge: 2018-08-01 | Disposition: A | Discharge status: Home / Self Care | Payer: BLUE CROSS/BLUE SHIELD | Source: Ambulatory Visit | Attending: Neurology | Admitting: Neurology

## 2018-08-01 ENCOUNTER — Other Ambulatory Visit: Payer: Self-pay

## 2018-08-01 DIAGNOSIS — R55 Syncope and collapse: Secondary | ICD-10-CM | POA: Insufficient documentation

## 2018-08-01 NOTE — Progress Notes (Signed)
Carotid duplex       has been completed. Preliminary results can be found under CV proc through chart review. Katie Moch, BS, RDMS, RVT   

## 2018-08-01 NOTE — Telephone Encounter (Signed)
I called the patient.  The carotid Doppler study is unremarkable, the 30-day cardiac monitor study has not yet been done, I will call him and get the results.   Carotid doppler 08/01/18:  Summary: Right Carotid: The extracranial vessels were near-normal with only minimal wall                thickening or plaque.  Left Carotid: The extracranial vessels were near-normal with only minimal wall               thickening or plaque.

## 2018-08-10 ENCOUNTER — Emergency Department
Admission: EM | Admit: 2018-08-10 | Discharge: 2018-08-10 | Disposition: A | Discharge status: Home / Self Care | Payer: BLUE CROSS/BLUE SHIELD | Attending: Emergency Medicine | Admitting: Emergency Medicine

## 2018-08-10 ENCOUNTER — Other Ambulatory Visit: Payer: Self-pay

## 2018-08-10 ENCOUNTER — Encounter: Payer: Self-pay | Admitting: Emergency Medicine

## 2018-08-10 ENCOUNTER — Emergency Department: Payer: BLUE CROSS/BLUE SHIELD

## 2018-08-10 DIAGNOSIS — M25561 Pain in right knee: Secondary | ICD-10-CM | POA: Diagnosis not present

## 2018-08-10 DIAGNOSIS — I10 Essential (primary) hypertension: Secondary | ICD-10-CM | POA: Diagnosis not present

## 2018-08-10 DIAGNOSIS — Z79899 Other long term (current) drug therapy: Secondary | ICD-10-CM | POA: Diagnosis not present

## 2018-08-10 HISTORY — DX: Unspecified convulsions: R56.9

## 2018-08-10 HISTORY — DX: Anxiety disorder, unspecified: F41.9

## 2018-08-10 MED ORDER — TRAMADOL HCL 50 MG PO TABS
50.0000 mg | ORAL_TABLET | Freq: Once | ORAL | Status: AC
  Administered 2018-08-10: 22:00:00 50 mg via ORAL
  Filled 2018-08-10: qty 1

## 2018-08-10 MED ORDER — TRAMADOL HCL 50 MG PO TABS: 50.0000 mg | ORAL_TABLET | Freq: Four times a day (QID) | ORAL | 0 refills | Status: AC | PRN

## 2018-08-10 NOTE — ED Provider Notes (Signed)
[facility] Emergency Department Provider Note  ____________________________________________  Time seen: Approximately 10:48 PM  I have reviewed the triage vital signs and the nursing notes.   HISTORY  Chief Complaint Knee Pain    HPI Edward Pitts is a 58 y.o. male presents to the emergency department with acute right knee pain for 1 day.  Patient reports that he typically has pain along the left knee but states that right knee pain started bothering him.  He denies falls or mechanisms of trauma.  He states that pain feels sharp and he has difficulty bending and straightening his knee.  Patient reports that he took Tylenol earlier tonight which relieved his pain.  No other alleviating measures have been attempted.        Past Medical History:  Diagnosis Date  . Anxiety   . Hyperlipidemia   . Hypertension   . Seizures [facility])     Patient Active Problem List   Diagnosis Date Noted  . 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 KNEE SURGERY Left   . LEFT HEART CATH AND CORONARY ANGIOGRAPHY Left 06/29/2017   Procedure: LEFT HEART CATH AND CORONARY ANGIOGRAPHY;  Surgeon: [name], MD;  Location: [place] CV [facility];  Service: Cardiovascular;  Laterality: Left;    Prior to Admission medications   Medication Sig Start Date End Date Taking? Authorizing Provider  atorvastatin (LIPITOR) 40 MG tablet Take 40 mg by mouth at bedtime.    [provider]  clonazePAM (KLONOPIN) 0.5 MG tablet 1 tablet in the morning, 2 in the evening 06/27/18   Kathrynn Ducking, MD  hydrocortisone 2.5 % cream Apply 1 application topically daily as needed (for itching).     [provider]  ketoconazole (NIZORAL) 2 % cream Apply 1 application topically daily as needed for irritation.     [provider]  lisinopril (PRINIVIL,ZESTRIL) 10 MG tablet Take 10 mg by mouth daily.    [provider]   metoprolol succinate (TOPROL-XL) 25 MG 24 hr tablet Take 25 mg by mouth daily.    [provider]  Multiple Vitamins-Minerals (CENTRUM SILVER PO) Take 1 tablet by mouth daily.    [provider]  traMADol (ULTRAM) 50 MG tablet Take 1 tablet (50 mg total) by mouth every 6 (six) hours as needed for up to 3 days. 08/10/18 08/13/18  Lannie Fields, PA-C    Allergies Ibuprofen  History reviewed. No pertinent family history.  Social History Social History   Tobacco Use  . Smoking status: Never Smoker  . Smokeless tobacco: Never Used  Substance Use Topics  . Alcohol use: No  . Drug use: No     Review of Systems  Constitutional: No fever/chills Eyes: No visual changes. No discharge ENT: No upper respiratory complaints. Cardiovascular: no chest pain. Respiratory: no cough. No SOB. Gastrointestinal: No abdominal pain.  No nausea, no vomiting.  No diarrhea.  No constipation. Musculoskeletal: Patient has right knee pain.  Skin: Negative for rash, abrasions, lacerations, ecchymosis. Neurological: Negative for headaches, focal weakness or numbness.  ____________________________________________   PHYSICAL EXAM:  VITAL SIGNS: ED Triage Vitals  Enc Vitals Group     BP 08/10/18 2036 (!) 164/73     Pulse Rate 08/10/18 2036 69     Resp 08/10/18 2036 17     Temp 08/10/18 2036 98.2 F (36.8 C)     Temp Source 08/10/18 2036 Oral  SpO2 08/10/18 2036 96 %     Weight 08/10/18 2037 225 lb (102.1 kg)     Height 08/10/18 2037 5\' 7"  (1.702 m)     Head Circumference --      Peak Flow --      Pain Score 08/10/18 2036 10     Pain Loc --      Pain Edu? --      Excl. in [place]? --      Constitutional: Alert and oriented. Well appearing and in no acute distress. Eyes: Conjunctivae are normal. PERRL. EOMI. Head: Atraumatic. Cardiovascular: Normal rate, regular rhythm. Normal S1 and S2.  Good peripheral circulation. Respiratory: Normal respiratory effort without tachypnea  or retractions. Lungs CTAB. Good air entry to the bases with no decreased or absent breath sounds. Musculoskeletal: Patient has 5 out of 5 strength in the upper and lower extremities bilaterally and symmetrically.  No deficits appreciated with provocative testing of the right knee.  Palpable dorsalis pedis pulse bilaterally and symmetrically. Neurologic:  Normal speech and language. No gross focal neurologic deficits are appreciated.  Skin:  Skin is warm, dry and intact. No rash noted. Psychiatric: Mood and affect are normal. Speech and behavior are normal. Patient exhibits appropriate insight and judgement.   ____________________________________________   LABS (all labs ordered are listed, but only abnormal results are displayed)  Labs Reviewed - No data to display ____________________________________________  EKG   ____________________________________________  RADIOLOGY I personally viewed and evaluated these images as part of my medical decision making, as well as reviewing the written report by the radiologist.  Dg Knee Complete 4 Views Right  Result Date: 08/10/2018 CLINICAL DATA:  Acute onset of RIGHT knee pain that began last night. No known injuries, though the patient describes crepitus with a popping sensation approximately 1 week ago. Patient unable to fully extend the knee. EXAM: RIGHT KNEE - COMPLETE 4+ VIEW COMPARISON:  None. FINDINGS: No evidence of acute, subacute or healed fractures. Calcified loose body in the superior joint space. Small joint effusion. Joint spaces well-preserved. Enthesopathic spur is at the insertion of the quadriceps tendon on the superior patella and the patellar tendon on the inferior patella. IMPRESSION: 1. No acute or subacute osseous abnormality. 2. Calcified loose body in the superior joint space. 3. Small joint effusion. Electronically Signed   By: Evangeline Dakin M.D.   On: 08/10/2018 21:51     ____________________________________________    PROCEDURES  Procedure(s) performed:    Procedures    Medications  traMADol (ULTRAM) tablet 50 mg (50 mg Oral Given 08/10/18 2221)     ____________________________________________   INITIAL IMPRESSION / ASSESSMENT AND PLAN / ED COURSE  Pertinent labs & imaging results that were available during my care of the patient were reviewed by me and considered in my medical decision making (see chart for details).  Review of the [place] CSRS was performed in accordance of the [place] prior to dispensing any controlled drugs.           Assessment and plan Right knee pain Patient presents to the emergency department with sharp right knee pain that is occurred for the past 2 days and is worsened with flexion and extension at the right knee.  X-ray examination revealed no acute bony abnormality.  Patient was discharged with a short course of tramadol and was advised to follow-up with orthopedics as needed.  All patient questions were answered.  ____________________________________________  FINAL CLINICAL IMPRESSION(S) / ED DIAGNOSES  Final diagnoses:  Acute pain  of right knee      NEW MEDICATIONS STARTED DURING THIS VISIT:  ED Discharge Orders         Ordered    traMADol (ULTRAM) 50 MG tablet  Every 6 hours PRN     08/10/18 2213              This chart was dictated using voice recognition software/Dragon. Despite best efforts to proofread, errors can occur which can change the meaning. Any change was purely unintentional.    Lannie Fields, PA-C 08/10/18 [place], [place], MD 08/11/18 [phone]

## 2018-08-10 NOTE — ED Notes (Signed)
PA in to speak with pt.

## 2018-08-10 NOTE — ED Triage Notes (Addendum)
Pt reports pain to right knee since last night; says he remembers a popping sensation in the knee one night last week but didn't have any pain; pt says he has to have a left knee replacement and is wondering if the right knee pain is just from compensating for the left; denies specific injury; pt says he cannot fully extend right leg at the knee; pt says he took some OTC pain medication prior to arrival but cannot say if it had tylenol or motrin in it

## 2018-08-16 ENCOUNTER — Other Ambulatory Visit: Payer: Self-pay | Admitting: Student

## 2018-08-16 ENCOUNTER — Other Ambulatory Visit (HOSPITAL_COMMUNITY): Payer: Self-pay | Admitting: Student

## 2018-08-16 DIAGNOSIS — M7041 Prepatellar bursitis, right knee: Secondary | ICD-10-CM

## 2018-08-16 DIAGNOSIS — M2341 Loose body in knee, right knee: Secondary | ICD-10-CM

## 2018-08-16 DIAGNOSIS — M1711 Unilateral primary osteoarthritis, right knee: Secondary | ICD-10-CM

## 2018-08-24 ENCOUNTER — Other Ambulatory Visit: Payer: Self-pay

## 2018-08-24 ENCOUNTER — Inpatient Hospital Stay
Admission: EM | Admit: 2018-08-24 | Discharge: 2018-08-29 | DRG: 853 | Disposition: A | Discharge status: Home / Self Care | Payer: BLUE CROSS/BLUE SHIELD | Attending: Internal Medicine | Admitting: Internal Medicine

## 2018-08-24 DIAGNOSIS — R001 Bradycardia, unspecified: Secondary | ICD-10-CM | POA: Diagnosis present

## 2018-08-24 DIAGNOSIS — R569 Unspecified convulsions: Secondary | ICD-10-CM

## 2018-08-24 DIAGNOSIS — J690 Pneumonitis due to inhalation of food and vomit: Secondary | ICD-10-CM | POA: Diagnosis present

## 2018-08-24 DIAGNOSIS — I10 Essential (primary) hypertension: Secondary | ICD-10-CM | POA: Diagnosis present

## 2018-08-24 DIAGNOSIS — Z23 Encounter for immunization: Secondary | ICD-10-CM

## 2018-08-24 DIAGNOSIS — N183 Chronic kidney disease, stage 3 (moderate): Secondary | ICD-10-CM | POA: Diagnosis present

## 2018-08-24 DIAGNOSIS — Z79899 Other long term (current) drug therapy: Secondary | ICD-10-CM

## 2018-08-24 DIAGNOSIS — N179 Acute kidney failure, unspecified: Secondary | ICD-10-CM | POA: Diagnosis present

## 2018-08-24 DIAGNOSIS — R570 Cardiogenic shock: Secondary | ICD-10-CM | POA: Diagnosis present

## 2018-08-24 DIAGNOSIS — F419 Anxiety disorder, unspecified: Secondary | ICD-10-CM | POA: Diagnosis present

## 2018-08-24 DIAGNOSIS — E785 Hyperlipidemia, unspecified: Secondary | ICD-10-CM | POA: Diagnosis present

## 2018-08-24 DIAGNOSIS — I129 Hypertensive chronic kidney disease with stage 1 through stage 4 chronic kidney disease, or unspecified chronic kidney disease: Secondary | ICD-10-CM | POA: Diagnosis present

## 2018-08-24 DIAGNOSIS — E872 Acidosis: Secondary | ICD-10-CM | POA: Diagnosis present

## 2018-08-24 DIAGNOSIS — R6521 Severe sepsis with septic shock: Secondary | ICD-10-CM | POA: Diagnosis present

## 2018-08-24 DIAGNOSIS — M109 Gout, unspecified: Secondary | ICD-10-CM | POA: Diagnosis present

## 2018-08-24 DIAGNOSIS — E875 Hyperkalemia: Secondary | ICD-10-CM | POA: Diagnosis present

## 2018-08-24 DIAGNOSIS — R0602 Shortness of breath: Secondary | ICD-10-CM

## 2018-08-24 DIAGNOSIS — I442 Atrioventricular block, complete: Secondary | ICD-10-CM | POA: Diagnosis present

## 2018-08-24 DIAGNOSIS — Z20828 Contact with and (suspected) exposure to other viral communicable diseases: Secondary | ICD-10-CM | POA: Diagnosis present

## 2018-08-24 DIAGNOSIS — I213 ST elevation (STEMI) myocardial infarction of unspecified site: Secondary | ICD-10-CM

## 2018-08-24 DIAGNOSIS — G40909 Epilepsy, unspecified, not intractable, without status epilepticus: Secondary | ICD-10-CM | POA: Diagnosis present

## 2018-08-24 DIAGNOSIS — Z950 Presence of cardiac pacemaker: Secondary | ICD-10-CM

## 2018-08-24 DIAGNOSIS — A419 Sepsis, unspecified organism: Secondary | ICD-10-CM | POA: Diagnosis present

## 2018-08-24 HISTORY — DX: Acute kidney failure, unspecified: N17.9

## 2018-08-24 LAB — CBC WITH DIFFERENTIAL/PLATELET
Abs Immature Granulocytes: 0.16 10*3/uL — ABNORMAL HIGH (ref 0.00–0.07)
Basophils Absolute: 0 10*3/uL (ref 0.0–0.1)
Basophils Relative: 0 %
Eosinophils Absolute: 0 10*3/uL (ref 0.0–0.5)
Eosinophils Relative: 0 %
HCT: 36.8 % — ABNORMAL LOW (ref 39.0–52.0)
Hemoglobin: 12 g/dL — ABNORMAL LOW (ref 13.0–17.0)
Immature Granulocytes: 1 %
Lymphocytes Relative: 13 %
Lymphs Abs: 2.4 10*3/uL (ref 0.7–4.0)
MCH: 29.4 pg (ref 26.0–34.0)
MCHC: 32.6 g/dL (ref 30.0–36.0)
MCV: 90.2 fL (ref 80.0–100.0)
Monocytes Absolute: 1.7 10*3/uL — ABNORMAL HIGH (ref 0.1–1.0)
Monocytes Relative: 9 %
Neutro Abs: 13.3 10*3/uL — ABNORMAL HIGH (ref 1.7–7.7)
Neutrophils Relative %: 77 %
Platelets: 381 10*3/uL (ref 150–400)
RBC: 4.08 MIL/uL — ABNORMAL LOW (ref 4.22–5.81)
RDW: 13.6 % (ref 11.5–15.5)
WBC: 17.5 10*3/uL — ABNORMAL HIGH (ref 4.0–10.5)
nRBC: 0 % (ref 0.0–0.2)

## 2018-08-24 LAB — BASIC METABOLIC PANEL
Anion gap: 13 (ref 5–15)
BUN: 58 mg/dL — ABNORMAL HIGH (ref 6–20)
CO2: 18 mmol/L — ABNORMAL LOW (ref 22–32)
Calcium: 8.7 mg/dL — ABNORMAL LOW (ref 8.9–10.3)
Chloride: 104 mmol/L (ref 98–111)
Creatinine, Ser: 3.4 mg/dL — ABNORMAL HIGH (ref 0.61–1.24)
GFR calc Af Amer: 22 mL/min — ABNORMAL LOW (ref >60)
GFR calc non Af Amer: 19 mL/min — ABNORMAL LOW (ref >60)
Glucose, Bld: 158 mg/dL — ABNORMAL HIGH (ref 70–99)
Potassium: 7.3 mmol/L (ref 3.5–5.1)
Sodium: 135 mmol/L (ref 135–145)

## 2018-08-24 LAB — PROCALCITONIN: Procalcitonin: 0.25 ng/mL

## 2018-08-24 LAB — SARS CORONAVIRUS 2 BY RT PCR (HOSPITAL ORDER, PERFORMED IN ~~LOC~~ HOSPITAL LAB): SARS Coronavirus 2: NEGATIVE

## 2018-08-24 LAB — MAGNESIUM: Magnesium: 2 mg/dL (ref 1.7–2.4)

## 2018-08-24 LAB — TROPONIN I: Troponin I: <0.03 ng/mL (ref <0.03)

## 2018-08-24 LAB — LACTIC ACID, PLASMA
Lactic Acid, Venous: 3.8 mmol/L (ref 0.5–1.9)
Lactic Acid, Venous: 4.4 mmol/L (ref 0.5–1.9)

## 2018-08-24 LAB — TSH: TSH: 7.778 u[IU]/mL — ABNORMAL HIGH (ref 0.350–4.500)

## 2018-08-24 LAB — PHOSPHORUS: Phosphorus: 4.8 mg/dL — ABNORMAL HIGH (ref 2.5–4.6)

## 2018-08-24 MED ORDER — INSULIN ASPART 100 UNIT/ML IV SOLN
10.0000 [IU] | Freq: Once | INTRAVENOUS | Status: AC
  Administered 2018-08-24: 10 [IU] via INTRAVENOUS
  Filled 2018-08-24: qty 0.1

## 2018-08-24 MED ORDER — CALCIUM GLUCONATE 10 % IV SOLN
INTRAVENOUS | Status: AC
  Filled 2018-08-24: qty 20

## 2018-08-24 MED ORDER — SODIUM BICARBONATE 8.4 % IV SOLN
50.0000 meq | Freq: Once | INTRAVENOUS | Status: AC
  Administered 2018-08-24: 50 meq via INTRAVENOUS
  Filled 2018-08-24: qty 50

## 2018-08-24 MED ORDER — SODIUM CHLORIDE 0.9 % IV SOLN
INTRAVENOUS | Status: AC
  Administered 2018-08-25: 01:00:00 via INTRAVENOUS

## 2018-08-24 MED ORDER — ONDANSETRON HCL 4 MG/2ML IJ SOLN
4.0000 mg | Freq: Once | INTRAMUSCULAR | Status: AC
  Administered 2018-08-24: 4 mg via INTRAVENOUS

## 2018-08-24 MED ORDER — CALCIUM GLUCONATE 10 % IV SOLN
1.0000 g | Freq: Once | INTRAVENOUS | Status: AC
  Administered 2018-08-24: 1 g via INTRAVENOUS
  Filled 2018-08-24: qty 10

## 2018-08-24 MED ORDER — ONDANSETRON HCL 4 MG/2ML IJ SOLN
4.0000 mg | Freq: Once | INTRAMUSCULAR | Status: AC
  Administered 2018-08-24: 22:00:00 4 mg via INTRAVENOUS

## 2018-08-24 MED ORDER — DEXTROSE 50 % IV SOLN
50.0000 mL | Freq: Once | INTRAVENOUS | Status: AC
  Administered 2018-08-24: 50 mL via INTRAVENOUS
  Filled 2018-08-24: qty 50

## 2018-08-24 MED ORDER — SODIUM BICARBONATE 8.4 % IV SOLN
50.0000 meq | Freq: Once | INTRAVENOUS | Status: AC
  Administered 2018-08-24: 23:00:00 50 meq via INTRAVENOUS
  Filled 2018-08-24: qty 50

## 2018-08-24 MED ORDER — DEXTROSE 50 % IV SOLN
1.0000 | Freq: Once | INTRAVENOUS | Status: AC
  Administered 2018-08-24: 50 mL via INTRAVENOUS

## 2018-08-24 MED ORDER — SODIUM BICARBONATE 8.4 % IV SOLN
INTRAVENOUS | Status: DC
  Administered 2018-08-24 – 2018-08-26 (×5): via INTRAVENOUS
  Filled 2018-08-24 (×14): qty 150

## 2018-08-24 MED ORDER — SODIUM CHLORIDE 0.9 % IV BOLUS
250.0000 mL | Freq: Once | INTRAVENOUS | Status: AC
  Administered 2018-08-24: 250 mL via INTRAVENOUS

## 2018-08-24 MED ORDER — ONDANSETRON HCL 4 MG/2ML IJ SOLN
INTRAMUSCULAR | Status: AC
  Filled 2018-08-24: qty 2

## 2018-08-24 MED ORDER — ONDANSETRON HCL 4 MG/2ML IJ SOLN
INTRAMUSCULAR | Status: AC
  Administered 2018-08-24: 4 mg via INTRAVENOUS
  Filled 2018-08-24: qty 2

## 2018-08-24 MED ORDER — INSULIN ASPART 100 UNIT/ML ~~LOC~~ SOLN
SUBCUTANEOUS | Status: AC
  Administered 2018-08-24: 22:00:00 10 [IU] via INTRAVENOUS
  Filled 2018-08-24: qty 1

## 2018-08-24 MED ORDER — CALCIUM GLUCONATE 10 % IV SOLN
1.0000 g | Freq: Once | INTRAVENOUS | Status: AC
  Administered 2018-08-24: 1 g via INTRAVENOUS

## 2018-08-24 MED ORDER — PATIROMER SORBITEX CALCIUM 8.4 G PO PACK
25.2000 g | PACK | ORAL | Status: AC
  Administered 2018-08-24: 25.2 g via ORAL
  Filled 2018-08-24: qty 3

## 2018-08-24 MED ORDER — DEXTROSE 50 % IV SOLN
INTRAVENOUS | Status: AC
  Administered 2018-08-24: 22:00:00 50 mL via INTRAVENOUS
  Filled 2018-08-24: qty 50

## 2018-08-24 MED ORDER — ALBUTEROL SULFATE (2.5 MG/3ML) 0.083% IN NEBU
10.0000 mg | INHALATION_SOLUTION | Freq: Once | RESPIRATORY_TRACT | Status: AC
  Administered 2018-08-24: 10 mg via RESPIRATORY_TRACT
  Filled 2018-08-24: qty 12

## 2018-08-24 NOTE — ED Notes (Signed)
ED TO INPATIENT HANDOFF REPORT  ED Nurse Name and Phone #: Daiva Nakayama, RN [phone]  S Name/Age/Gender Edward Pitts 58 y.o. male Room/Bed: ED02A/ED02A  Code Status   Code Status: Prior  Home/SNF/Other Home Patient oriented to: self, place, time and situation Is this baseline? Yes   Triage Complete: Triage complete  Chief Complaint shortness of breath  Triage Note Pt coems EMS after calling out with SOB and nausea dizziness. Pt was in the 20s HR with EMS and is now. 1mg  atropine given with EMS with no change. Pt has 2 20g IVs from EMS. 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 AOx4.    Allergies Allergies  Allergen Reactions  . Ibuprofen Other (See Comments)    Affects kidneys    Level of Care/Admitting Diagnosis ED Disposition    ED Disposition Condition [facility]: [place] [[id number]]  Level of Care: ICU [6]  Covid Evaluation: Confirmed COVID Negative  Diagnosis: Complete heart block [facility]) [[id number]]  Admitting Physician: Lance Coon [[id number]]  Attending Physician: Lance Coon [phone]  Estimated length of stay: past midnight tomorrow  Certification:: I certify this patient will need inpatient services for at least 2 midnights  PT Class (Do Not Modify): Inpatient [101]  PT Acc Code (Do Not Modify): Private [1]       B Medical/Surgery History Past Medical History:  Diagnosis Date  . Anxiety   . Hyperlipidemia   . Hypertension   . Seizures ([place])    Past Surgical History:  Procedure Laterality Date  . KNEE SURGERY Left   . LEFT HEART CATH AND CORONARY ANGIOGRAPHY Left 06/29/2017   Procedure: LEFT HEART CATH AND CORONARY ANGIOGRAPHY;  Surgeon: [name], MD;  Location: [place] CV LAB;  Service: Cardiovascular;  Laterality: Left;     A IV Location/Drains/Wounds Patient Lines/Drains/Airways Status   Active Line/Drains/Airways    Name:   Placement date:   Placement time:   Site:   Days:   Peripheral IV 08/24/18  Right Antecubital   08/24/18    2037    Antecubital   less than 1   Peripheral IV 08/24/18 Left Antecubital   08/24/18    2038    Antecubital   less than 1          Intake/Output Last 24 hours No intake or output data in the 24 hours ending 08/24/18 2333  Labs/Imaging Results for orders placed or performed during the hospital encounter of 08/24/18 (from the past 48 hour(s))  Basic metabolic panel     Status: Abnormal   Collection Time: 08/24/18  8:42 PM  Result Value Ref Range   Sodium 135 135 - 145 mmol/L   Potassium 7.3 (HH) 3.5 - 5.1 mmol/L    Comment: CRITICAL RESULT CALLED TO, READ BACK BY AND VERIFIED WITH JESSICA COLTRANE @2142  ON 08/24/2018 BY FMW    Chloride 104 98 - 111 mmol/L   CO2 18 (L) 22 - 32 mmol/L   Glucose, Bld 158 (H) 70 - 99 mg/dL   BUN 58 (H) 6 - 20 mg/dL   Creatinine, Ser 3.40 (H) 0.61 - 1.24 mg/dL   Calcium 8.7 (L) 8.9 - 10.3 mg/dL   GFR calc non Af Amer 19 (L) >60 mL/min   GFR calc Af Amer 22 (L) >60 mL/min   Anion gap 13 5 - 15    Comment: Performed at [facility], [address], [place], [place] [address]  Troponin I - Once  Status: None   Collection Time: 08/24/18  8:42 PM  Result Value Ref Range   Troponin I <0.03 <0.03 ng/mL    Comment: Performed at [facility], [address] [place], [place] [address]  CBC with Differential     Status: Abnormal   Collection Time: 08/24/18  8:42 PM  Result Value Ref Range   WBC 17.5 (H) 4.0 - 10.5 K/uL   RBC 4.08 (L) 4.22 - 5.81 MIL/uL   Hemoglobin 12.0 (L) 13.0 - 17.0 g/dL   HCT 36.8 (L) 39.0 - 52.0 %   MCV 90.2 80.0 - 100.0 fL   MCH 29.4 26.0 - 34.0 pg   MCHC 32.6 30.0 - 36.0 g/dL   RDW 13.6 11.5 - 15.5 %   Platelets 381 150 - 400 K/uL   nRBC 0.0 0.0 - 0.2 %   Neutrophils Relative % 77 %   Neutro Abs 13.3 (H) 1.7 - 7.7 K/uL   Lymphocytes Relative 13 %   Lymphs Abs 2.4 0.7 - 4.0 K/uL   Monocytes Relative 9 %   Monocytes Absolute 1.7 (H) 0.1 - 1.0 K/uL   Eosinophils  Relative 0 %   Eosinophils Absolute 0.0 0.0 - 0.5 K/uL   Basophils Relative 0 %   Basophils Absolute 0.0 0.0 - 0.1 K/uL   Immature Granulocytes 1 %   Abs Immature Granulocytes 0.16 (H) 0.00 - 0.07 K/uL    Comment: Performed at [facility], [address] [place], [place] [address]  Lactic acid, plasma     Status: Abnormal   Collection Time: 08/24/18  8:42 PM  Result Value Ref Range   Lactic Acid, Venous 3.8 (HH) 0.5 - 1.9 mmol/L    Comment: CRITICAL RESULT CALLED TO, READ BACK BY AND VERIFIED WITH JESSICA COLTRANE @2204  08/24/2018 BY FMW Performed at [facility], [address] [place], [place] [address]   Magnesium     Status: None   Collection Time: 08/24/18  8:42 PM  Result Value Ref Range   Magnesium 2.0 1.7 - 2.4 mg/dL    Comment: Performed at [facility], [address] [place], [place] [address]  Phosphorus     Status: Abnormal   Collection Time: 08/24/18  8:42 PM  Result Value Ref Range   Phosphorus 4.8 (H) 2.5 - 4.6 mg/dL    Comment: Performed at [facility], [address] [place], [place] [address]  TSH     Status: Abnormal   Collection Time: 08/24/18  8:42 PM  Result Value Ref Range   TSH 7.778 (H) 0.350 - 4.500 uIU/mL    Comment: Performed by a 3rd Generation assay with a functional sensitivity of <=0.01 uIU/mL. Performed at [facility], [address] [place], [place] [address]   SARS Coronavirus 2 (CEPHEID - Performed in [facility] hospital lab), Hosp Order     Status: None   Collection Time: 08/24/18  8:42 PM  Result Value Ref Range   SARS Coronavirus 2 NEGATIVE NEGATIVE    Comment: (NOTE) If result is NEGATIVE SARS-CoV-2 target nucleic acids are NOT DETECTED. The SARS-CoV-2 RNA is generally detectable in upper and lower  respiratory specimens during the acute phase of infection. The lowest  concentration of SARS-CoV-2 viral copies this assay can detect is 250  copies / mL. A negative result does  not preclude SARS-CoV-2 infection  and should not be used as the sole basis for treatment or other  patient management decisions.  A negative result may  occur with  improper specimen collection / handling, submission of specimen other  than nasopharyngeal swab, presence of viral mutation(s) within the  areas targeted by this assay, and inadequate number of viral copies  (<250 copies / mL). A negative result must be combined with clinical  observations, patient history, and epidemiological information. If result is POSITIVE SARS-CoV-2 target nucleic acids are DETECTED. The SARS-CoV-2 RNA is generally detectable in upper and lower  respiratory specimens dur ing the acute phase of infection.  Positive  results are indicative of active infection with SARS-CoV-2.  Clinical  correlation with patient history and other diagnostic information is  necessary to determine patient infection status.  Positive results do  not rule out bacterial infection or co-infection with other viruses. If result is PRESUMPTIVE POSTIVE SARS-CoV-2 nucleic acids MAY BE PRESENT.   A presumptive positive result was obtained on the submitted specimen  and confirmed on repeat testing.  While 2019 novel coronavirus  (SARS-CoV-2) nucleic acids may be present in the submitted sample  additional confirmatory testing may be necessary for epidemiological  and / or clinical management purposes  to differentiate between  SARS-CoV-2 and other Sarbecovirus currently known to infect humans.  If clinically indicated additional testing with an alternate test  methodology [phone]) is advised. The SARS-CoV-2 RNA is generally  detectable in upper and lower respiratory sp ecimens during the acute  phase of infection. The expected result is Negative. Fact Sheet for Patients:  StrictlyIdeas.no Fact Sheet for Healthcare Providers: BankingDealers.co.za This test is not yet approved or  cleared by the [place] FDA and has been authorized for detection and/or diagnosis of SARS-CoV-2 by FDA under an Emergency Use Authorization (EUA).  This EUA will remain in effect (meaning this test can be used) for the duration of the COVID-19 declaration under Section 564(b)(1) of the Act, 21 U.S.C. section 360bbb-3(b)(1), unless the authorization is terminated or revoked sooner. Performed at [facility], [place], [place] [address]   Procalcitonin - Baseline     Status: None   Collection Time: 08/24/18  8:42 PM  Result Value Ref Range   Procalcitonin 0.25 ng/mL    Comment:        Interpretation: PCT (Procalcitonin) <= 0.5 ng/mL: Systemic infection (sepsis) is not likely. Local bacterial infection is possible. (NOTE)       Sepsis PCT Algorithm           Lower Respiratory Tract                                      Infection PCT Algorithm    ----------------------------     ----------------------------         PCT < 0.25 ng/mL                PCT < 0.10 ng/mL         Strongly encourage             Strongly discourage   discontinuation of antibiotics    initiation of antibiotics    ----------------------------     -----------------------------       PCT 0.25 - 0.50 ng/mL            PCT 0.10 - 0.25 ng/mL               OR       >80% decrease in PCT  Discourage initiation of                                            antibiotics      Encourage discontinuation           of antibiotics    ----------------------------     -----------------------------         PCT >= 0.50 ng/mL              PCT 0.26 - 0.50 ng/mL               AND        <80% decrease in PCT             Encourage initiation of                                             antibiotics       Encourage continuation           of antibiotics    ----------------------------     -----------------------------        PCT >= 0.50 ng/mL                  PCT > 0.50 ng/mL               AND          increase in PCT                  Strongly encourage                                      initiation of antibiotics    Strongly encourage escalation           of antibiotics                                     -----------------------------                                           PCT <= 0.25 ng/mL                                                 OR                                        > 80% decrease in PCT                                     Discontinue / Do not initiate  antibiotics Performed at [facility], [address] [place], [place] [address]    No results found.  Pending Labs Unresulted Labs (From admission, onward)    Start     Ordered   08/24/18 2247  Urinalysis, Complete w Microscopic  ONCE - STAT,   STAT     08/24/18 2246   08/24/18 2041  Lactic acid, plasma  Now then every 2 hours,   STAT     08/24/18 2041   Signed and Held  HIV antibody (Routine Testing)  Once,   R     Signed and Held   Signed and Held  CBC  (heparin)  Once,   R    Comments:  Baseline for heparin therapy IF NOT ALREADY DRAWN.  Notify MD if PLT < 100 K.    Signed and Held   Signed and Held  Creatinine, serum  (heparin)  Once,   R    Comments:  Baseline for heparin therapy IF NOT ALREADY DRAWN.    Signed and Held   Signed and Held  Basic metabolic panel  Tomorrow morning,   R     Signed and Held   Signed and Held  CBC  Tomorrow morning,   R     Signed and Held          Vitals/Pain Today's Vitals   08/24/18 2134 08/24/18 2141 08/24/18 2218 08/24/18 2312  BP:   (!) 146/84   Pulse: (!) 42 (!) 104 68 (!) 55  Resp: (!) 22 (!) 26 12 15   Temp:      TempSrc:      SpO2: 100% 100% 95% 100%  Weight:      Height:      PainSc:    5     Isolation Precautions No active isolations  Medications Medications  sodium bicarbonate 150 mEq in dextrose 5 % 1,000 mL infusion ( Intravenous New Bag/Given 08/24/18 2246)  ondansetron (ZOFRAN)  injection 4 mg (4 mg Intravenous Given 08/24/18 2043)  calcium gluconate inj 10% (1 g) URGENT USE ONLY! (1 g Intravenous Given 08/24/18 2154)  albuterol (PROVENTIL) (2.5 MG/3ML) 0.083% nebulizer solution 10 mg (10 mg Nebulization Given 08/24/18 2244)  sodium bicarbonate injection 50 mEq (50 mEq Intravenous Given 08/24/18 2153)  insulin aspart (novoLOG) injection 10 Units (10 Units Intravenous Given 08/24/18 2154)    And  dextrose 50 % solution 50 mL (50 mLs Intravenous Given 08/24/18 2154)  calcium gluconate inj 10% (1 g) URGENT USE ONLY! (1 g Intravenous Given 08/24/18 2213)  calcium gluconate inj 10% (1 g) URGENT USE ONLY! (1 g Intravenous Given 08/24/18 2213)  ondansetron (ZOFRAN) injection 4 mg (4 mg Intravenous Given 08/24/18 2211)  patiromer Daryll Drown) packet 25.2 g (25.2 g Oral Given 08/24/18 2249)  sodium bicarbonate injection 50 mEq (50 mEq Intravenous Given 08/24/18 2252)  dextrose 50 % solution 50 mL (50 mLs Intravenous Given 08/24/18 2252)  sodium chloride 0.9 % bolus 250 mL (250 mLs Intravenous Bolus 08/24/18 2300)    Mobility walks     Focused Assessments Cardiac Assessment Handoff:    Lab Results  Component Value Date   TROPONINI <0.03 08/24/2018   No results found for: DDIMER Does the Patient currently have chest pain? No     R Recommendations: See Admitting Provider Note  Report given to:   Additional Notes: None

## 2018-08-24 NOTE — ED Provider Notes (Addendum)
[facility] Emergency Department Provider Note  ____________________________________________  Time seen: Approximately 10:21 PM  I have reviewed the triage vital signs and the nursing notes.   HISTORY  Chief Complaint Bradycardia and Shortness of Breath    HPI Edward Pitts is a 58 y.o. male with a history of hypertension hyperlipidemia and CKD who is brought to the ED by EMS due to dizziness shortness of breath, found to have a heart rate of 20.  They gave 1 mg of atropine IV without response.  Patient reports eating and drinking normally, no recent illness, symptoms all started this afternoon at about 1 or 2:00 PM.  He denies any chest pains or recent exertional symptoms.  Had a left heart cath done a year ago which showed 30% CAD.  His cardiologist is Dr. Nehemiah Massed.  Only AV nodal blocker he takes his 25 mg metoprolol once daily.  He has not taken any overdose.   He reports that today he gets dizzy on standing and feels like he is going to pass out.  He has had episodes of syncope over the last few weeks.   Past Medical History:  Diagnosis Date  . Anxiety   . Hyperlipidemia   . Hypertension   . Seizures [facility])      Patient Active Problem List   Diagnosis Date Noted  . AKI (acute kidney injury) ([place]) 08/24/2018  . Acute hyperkalemia 08/24/2018  . HTN (hypertension) 08/24/2018  . HLD (hyperlipidemia) 08/24/2018  . Seizures ([place]) 08/24/2018  . Complete heart block ([place]) 08/24/2018  . 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 KNEE SURGERY Left   . LEFT HEART CATH AND CORONARY ANGIOGRAPHY Left 06/29/2017   Procedure: LEFT HEART CATH AND CORONARY ANGIOGRAPHY;  Surgeon: [name], MD;  Location: [facility];  Service: Cardiovascular;  Laterality: Left;     Prior to Admission medications   Medication Sig Start Date End Date Taking? Authorizing Provider  atorvastatin (LIPITOR) 40 MG tablet  Take 40 mg by mouth at bedtime.    [provider]  clonazePAM (KLONOPIN) 0.5 MG tablet 1 tablet in the morning, 2 in the evening 06/27/18   Kathrynn Ducking, MD  hydrocortisone 2.5 % cream Apply 1 application topically daily as needed (for itching).     [provider]  ketoconazole (NIZORAL) 2 % cream Apply 1 application topically daily as needed for irritation.     [provider]  lisinopril (PRINIVIL,ZESTRIL) 10 MG tablet Take 10 mg by mouth daily.    [provider]  metoprolol succinate (TOPROL-XL) 25 MG 24 hr tablet Take 25 mg by mouth daily.    [provider]  Multiple Vitamins-Minerals (CENTRUM SILVER PO) Take 1 tablet by mouth daily.    [provider]     Allergies Ibuprofen   History reviewed. No pertinent family history.  Social History Social History   Tobacco Use  . Smoking status: Never Smoker  . Smokeless tobacco: Never Used  Substance Use Topics  . Alcohol use: No  . Drug use: No    Review of Systems  Constitutional:   No fever or chills.  ENT:   No sore throat. No rhinorrhea. Cardiovascular:   No chest pain or syncope. Respiratory: Positive shortness of breath without cough. Gastrointestinal:   Negative for abdominal pain, vomiting and diarrhea.  Musculoskeletal:   Negative for focal pain or swelling All other systems reviewed and are  negative except as documented above in ROS and HPI.  ____________________________________________   PHYSICAL EXAM:  VITAL SIGNS: ED Triage Vitals  Enc Vitals Group     BP 08/24/18 2036 121/82     Pulse Rate 08/24/18 2036 (!) 19     Resp 08/24/18 2036 13     Temp 08/24/18 2038 (!) 97.3 F (36.3 C)     Temp Source 08/24/18 2038 Oral     SpO2 08/24/18 2036 96 %     Weight 08/24/18 2034 224 lb 13.9 oz (102 kg)     Height 08/24/18 2034 5\' 7"  (1.702 m)     Head Circumference --      Peak Flow --      Pain Score 08/24/18 2034 3     Pain Loc --      Pain Edu? --       Excl. in [place]? --     Vital signs reviewed, nursing assessments reviewed.   Constitutional:   Alert and oriented ill-appearing. Eyes:   Conjunctivae are normal. EOMI. PERRL. ENT      Head:   Normocephalic and atraumatic.      Nose:   No congestion/rhinnorhea.       Mouth/Throat:   Dry mucous membranes, no pharyngeal erythema. No peritonsillar mass.       Neck:   No meningismus. Full ROM. Hematological/Lymphatic/Immunilogical:   No cervical lymphadenopathy. Cardiovascular:   Bradycardia heart rate 20. Symmetric bilateral radial and DP pulses.  No murmurs.  Extremities cool, cap refill delayed. Respiratory:   Normal respiratory effort without tachypnea/retractions. Breath sounds are clear and equal bilaterally. No wheezes/rales/rhonchi. Gastrointestinal:   Soft and nontender. Non distended. There is no CVA tenderness.  No rebound, rigidity, or guarding.  Musculoskeletal:   Normal range of motion in all extremities. No joint effusions.  No lower extremity tenderness.  No edema. Neurologic:   Normal speech and language.  Motor grossly intact. No acute focal neurologic deficits are appreciated.  Skin:    Skin is warm, dry and intact. No rash noted.  No petechiae, purpura, or bullae.  ____________________________________________    LABS (pertinent positives/negatives) (all labs ordered are listed, but only abnormal results are displayed) Labs Reviewed  BASIC METABOLIC PANEL - Abnormal; Notable for the following components:      Result Value   Potassium 7.3 (*)    CO2 18 (*)    Glucose, Bld 158 (*)    BUN 58 (*)    Creatinine, Ser 3.40 (*)    Calcium 8.7 (*)    GFR calc non Af Amer 19 (*)    GFR calc Af Amer 22 (*)    All other components within normal limits  CBC WITH DIFFERENTIAL/PLATELET - Abnormal; Notable for the following components:   WBC 17.5 (*)    RBC 4.08 (*)    Hemoglobin 12.0 (*)    HCT 36.8 (*)    Neutro Abs 13.3 (*)    Monocytes Absolute 1.7 (*)    Abs  Immature Granulocytes 0.16 (*)    All other components within normal limits  LACTIC ACID, PLASMA - Abnormal; Notable for the following components:   Lactic Acid, Venous 3.8 (*)    All other components within normal limits  PHOSPHORUS - Abnormal; Notable for the following components:   Phosphorus 4.8 (*)    All other components within normal limits  TSH - Abnormal; Notable for the following components:   TSH 7.778 (*)    All other components within  normal limits  SARS CORONAVIRUS 2 (HOSPITAL ORDER, PERFORMED IN [facility])  TROPONIN I  MAGNESIUM  PROCALCITONIN  LACTIC ACID, PLASMA  URINALYSIS, COMPLETE (UACMP) WITH MICROSCOPIC   ____________________________________________   EKG  Interpreted by me Due to heart rate of 20, it takes multiple EKGs to capture 1 beat in each lead. Left axis.  Complete heart block.  Atrial rate of about 300, ventricular rate of 20.  LVH with repolarization abnormality.  Not consistent with STEMI.  ____________________________________________    RADIOLOGY  No results found.  ____________________________________________   PROCEDURES .Critical Care Performed by: Carrie Mew, MD Authorized by: Carrie Mew, MD   Critical care provider statement:    Critical care time (minutes):  45   Critical care time was exclusive of:  Separately billable procedures and treating other patients   Critical care was necessary to treat or prevent imminent or life-threatening deterioration of the following conditions:  Cardiac failure, circulatory failure, shock and metabolic crisis   Critical care was time spent personally by me on the following activities:  Development of treatment plan with patient or surrogate, discussions with consultants, evaluation of patient's response to treatment, examination of patient, obtaining history from patient or surrogate, ordering and performing treatments and interventions, ordering and review of  laboratory studies, ordering and review of radiographic studies, pulse oximetry, re-evaluation of patient's condition and review of old charts    ____________________________________________  DIFFERENTIAL DIAGNOSIS   ACS, electrolyte abnormality, intrinsic AV nodal dysfunction, metoprolol toxicity  CLINICAL IMPRESSION / ASSESSMENT AND PLAN / ED COURSE  Medications ordered in the ED: Medications  sodium bicarbonate 150 mEq in dextrose 5 % 1,000 mL infusion ( Intravenous New Bag/Given 08/24/18 2246)  ondansetron (ZOFRAN) injection 4 mg (4 mg Intravenous Given 08/24/18 2043)  calcium gluconate inj 10% (1 g) URGENT USE ONLY! (1 g Intravenous Given 08/24/18 2154)  albuterol (PROVENTIL) (2.5 MG/3ML) 0.083% nebulizer solution 10 mg (10 mg Nebulization Given 08/24/18 2244)  sodium bicarbonate injection 50 mEq (50 mEq Intravenous Given 08/24/18 2153)  insulin aspart (novoLOG) injection 10 Units (10 Units Intravenous Given 08/24/18 2154)    And  dextrose 50 % solution 50 mL (50 mLs Intravenous Given 08/24/18 2154)  calcium gluconate inj 10% (1 g) URGENT USE ONLY! (1 g Intravenous Given 08/24/18 2213)  calcium gluconate inj 10% (1 g) URGENT USE ONLY! (1 g Intravenous Given 08/24/18 2213)  ondansetron (ZOFRAN) injection 4 mg (4 mg Intravenous Given 08/24/18 2211)  patiromer Daryll Drown) packet 25.2 g (25.2 g Oral Given 08/24/18 2249)  sodium bicarbonate injection 50 mEq (50 mEq Intravenous Given 08/24/18 2252)  dextrose 50 % solution 50 mL (50 mLs Intravenous Given 08/24/18 2252)  sodium chloride 0.9 % bolus 250 mL (250 mLs Intravenous Bolus 08/24/18 2300)    Pertinent labs & imaging results that were available during my care of the patient were reviewed by me and considered in my medical decision making (see chart for details).  Xayne Brumbaugh was evaluated in Emergency Department on 08/24/2018 for the symptoms described in the history of present illness. He was evaluated in the context of the global COVID-19  pandemic, which necessitated consideration that the patient might be at risk for infection with the SARS-CoV-2 virus that causes COVID-19. Institutional protocols and algorithms that pertain to the evaluation of patients at risk for COVID-19 are in a state of rapid change based on information released by regulatory bodies including the CDC and federal and state organizations. These policies and algorithms were followed  during the patient's care in the ED.   Patient presents with profound bradycardia due to complete heart block, but normal blood pressure.  98% on room air.  Not in distress not diaphoretic, mentating.  At this point I will hold off on external pacing pending labs.  Gentle IV fluids, cautious to avoid precipitating pulmonary edema with his diminished cardiac output.  Clinical Course as of Aug 25 2  Sat Aug 24, 2018  2208 The patient is noted to have a lactate>4. With the current information available to me, I don't think the patient is in septic shock. The lactate>4, is related to OTHER SHOCK hyperkalemia and cardiogenic shock    [PS]  2224 Potassium of 7.3, acute on chronic renal failure.  Calcium, bicarb, insulin glucose and albuterol ordered.  Continue IV fluids with consideration of poor cardiac output at this point.  Discussed with nephrology, recommends bicarb drip and Veltassa in addition to the emergent measures.  Once heart rate and cardiac output improve, would plan to increase IV fluid rehydration.  Blood pressure remained stable.   [PS]  2248 No response to therapy yet, still mentating, blood pressure stable, heart rate 20.  Will give additional bicarb bolus, additional 500 of saline, at 250 an hour until heart rate improves.  Bicarb drip started.   [PS]  2331 Ventricular rate still 20. Contiuing to monitor. Hospitalist to bedside now.    [PS]  4210 Procal low. Unlikely infectious.    [PS]    Clinical Course User Index [PS] Carrie Mew, MD      ____________________________________________   FINAL CLINICAL IMPRESSION(S) / ED DIAGNOSES    Final diagnoses:  Acute renal failure, unspecified acute renal failure type ([place])  Hyperkalemia  Complete heart block [facility])     ED Discharge Orders    None      Portions of this note were generated with dragon dictation software. Dictation errors may occur despite best attempts at proofreading.   Carrie Mew, MD 08/24/18 3128    Carrie Mew, MD 08/25/18 [phone]

## 2018-08-24 NOTE — Progress Notes (Addendum)
Ch responded to a page regarding pt. Pt presented to ER from home w/ nausea and SOB. Pt was responsive and mobile as reported and witnessed by Probation officer. Pt's wife could not be reached by ch but RN at bedside was able to speak w/ pt's sister to inform them that the pt would be admitted due to his condition. Ch informed pt that f/u would occur once he was settled as the care team prepared to get a swab for the pt.  F/u should include communicating w/ pt about any concerns he has and offer emotional support since the pt was just hospitalized 2 weeks ago.    08/24/18 2035  Clinical Encounter Type  Visited With Patient;Health care provider  Visit Type ED;Social support  Referral From Nurse  Consult/Referral To Chaplain  Spiritual Encounters  Spiritual Needs Emotional;Grief support  Stress Factors  Patient Stress Factors Exhausted;Health changes;Major life changes  Family Stress Factors None identified  Advance Directives (For Healthcare)  Does Patient Have a Medical Advance Directive? No  Mental Health Advance Directives  Does Patient Have a Mental Health Advance Directive? No

## 2018-08-24 NOTE — ED Triage Notes (Signed)
Pt coems EMS after calling out with SOB and nausea dizziness. Pt was in the 20s HR with EMS and is now. 1mg  atropine given with EMS with no change. Pt has 2 20g IVs from EMS. 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 AOx4.

## 2018-08-24 NOTE — ED Notes (Addendum)
Dr Joni Fears made aware at this time of pt's elevated Lactic Acid level as reported by lab. Lactic Acid 4.4 mmol/L

## 2018-08-24 NOTE — H&P (Signed)
[facility] [place] NAME: Edward Pitts    MR#:  [id number]  DATE OF BIRTH:  25-Jan-1961  DATE OF ADMISSION:  08/24/2018  PRIMARY CARE PHYSICIAN: Petra Kuba, MD   REQUESTING/REFERRING PHYSICIAN: Joni Fears, MD  CHIEF COMPLAINT:   Chief Complaint  Patient presents with  . Bradycardia  . Shortness of Breath    HISTORY OF PRESENT ILLNESS:  Edward Pitts  is a 58 y.o. male who presents with chief complaint as above.  Patient presents the ED after having 2 syncopal episodes at home today.  He denies any other symptoms except for some mild shortness of breath here he is found to be profoundly bradycardic.  On evaluation he is noted to be in complete heart block.  He is subsequently found to have acute kidney injury and severe hyperkalemia.  His potassium was 7.3.  Nephrology was contacted by ED physician and they recommended starting immediate potassium correcting medications, and that if the patient's potassium was not corrected quickly enough and his heart rate was not improving then dialysis could be performed.  Hospitalist were called for admission  PAST MEDICAL HISTORY:   Past Medical History:  Diagnosis Date  . Anxiety   . Hyperlipidemia   . Hypertension   . Seizures ([place])      PAST SURGICAL HISTORY:   Past Surgical History:  Procedure Laterality Date  . KNEE SURGERY Left   . LEFT HEART CATH AND CORONARY ANGIOGRAPHY Left 06/29/2017   Procedure: LEFT HEART CATH AND CORONARY ANGIOGRAPHY;  Surgeon: [name], MD;  Location: [facility];  Service: Cardiovascular;  Laterality: Left;     SOCIAL HISTORY:   Social History   Tobacco Use  . Smoking status: Never Smoker  . Smokeless tobacco: Never Used  Substance Use Topics  . Alcohol use: No     FAMILY HISTORY:    Family history reviewed and is non-contributory DRUG ALLERGIES:   Allergies  Allergen Reactions  . Ibuprofen Other (See Comments)     Affects kidneys    MEDICATIONS AT HOME:   Prior to Admission medications   Medication Sig Start Date End Date Taking? Authorizing Provider  atorvastatin (LIPITOR) 40 MG tablet Take 40 mg by mouth at bedtime.    [provider]  clonazePAM (KLONOPIN) 0.5 MG tablet 1 tablet in the morning, 2 in the evening 06/27/18   Kathrynn Ducking, MD  hydrocortisone 2.5 % cream Apply 1 application topically daily as needed (for itching).     [provider]  ketoconazole (NIZORAL) 2 % cream Apply 1 application topically daily as needed for irritation.     [provider]  lisinopril (PRINIVIL,ZESTRIL) 10 MG tablet Take 10 mg by mouth daily.    [provider]  metoprolol succinate (TOPROL-XL) 25 MG 24 hr tablet Take 25 mg by mouth daily.    [provider]  Multiple Vitamins-Minerals (CENTRUM SILVER PO) Take 1 tablet by mouth daily.    [provider]    REVIEW OF SYSTEMS:  Review of Systems  Constitutional: Negative for chills, fever, malaise/fatigue and weight loss.  HENT: Negative for ear pain, hearing loss and tinnitus.   Eyes: Negative for blurred vision, double vision, pain and redness.  Respiratory: Positive for shortness of breath. Negative for cough and hemoptysis.   Cardiovascular: Negative for chest pain, palpitations, orthopnea and leg swelling.  Gastrointestinal: Negative for abdominal pain, constipation, diarrhea, nausea and vomiting.  Genitourinary: Negative for  dysuria, frequency and hematuria.  Musculoskeletal: Negative for back pain, joint pain and neck pain.  Skin:       No acne, rash, or lesions  Neurological: Positive for loss of consciousness. Negative for dizziness, tremors, focal weakness and weakness.  Endo/Heme/Allergies: Negative for polydipsia. Does not bruise/bleed easily.  Psychiatric/Behavioral: Negative for depression. The patient is not nervous/anxious and does not have insomnia.      VITAL SIGNS:   Vitals:    08/24/18 2038 08/24/18 2134 08/24/18 2141 08/24/18 2218  BP:    (!) 146/84  Pulse:  (!) 42 (!) 104 68  Resp:  (!) 22 (!) 26 12  Temp: (!) 97.3 F (36.3 C)     TempSrc: Oral     SpO2:  100% 100% 95%  Weight:      Height:       Wt Readings from Last 3 Encounters:  08/24/18 102 kg  08/10/18 102.1 kg  05/21/18 104.9 kg    PHYSICAL EXAMINATION:  Physical Exam  Vitals reviewed. Constitutional: He is oriented to person, place, and time. He appears well-developed and well-nourished. No distress.  HENT:  Head: Normocephalic and atraumatic.  Mouth/Throat: Oropharynx is clear and moist.  Eyes: Pupils are equal, round, and reactive to light. Conjunctivae and EOM are normal. No scleral icterus.  Neck: Normal range of motion. Neck supple. No JVD present. No thyromegaly present.  Cardiovascular: Regular rhythm and intact distal pulses. Exam reveals no gallop and no friction rub.  No murmur heard. Severe bradycardia  Respiratory: Effort normal and breath sounds normal. No respiratory distress. He has no wheezes. He has no rales.  GI: Soft. Bowel sounds are normal. He exhibits no distension. There is no abdominal tenderness.  Musculoskeletal: Normal range of motion.        General: No edema.     Comments: No arthritis, no gout  Lymphadenopathy:    He has no cervical adenopathy.  Neurological: He is alert and oriented to person, place, and time. No cranial nerve deficit.  No dysarthria, no aphasia  Skin: Skin is warm and dry. No rash noted. No erythema.  Psychiatric: He has a normal mood and affect. His behavior is normal. Judgment and thought content normal.    LABORATORY PANEL:   CBC Recent Labs  Lab 08/24/18 2042  WBC 17.5*  HGB 12.0*  HCT 36.8*  PLT 381   ------------------------------------------------------------------------------------------------------------------  Chemistries  Recent Labs  Lab 08/24/18 2042  NA 135  K 7.3*  CL 104  CO2 18*  GLUCOSE 158*  BUN 58*   CREATININE 3.40*  CALCIUM 8.7*  MG 2.0   ------------------------------------------------------------------------------------------------------------------  Cardiac Enzymes Recent Labs  Lab 08/24/18 2042  TROPONINI <0.03   ------------------------------------------------------------------------------------------------------------------  RADIOLOGY:  No results found.  EKG:   Orders placed or performed during the hospital encounter of 08/24/18  . ED EKG  . ED EKG  . EKG 12-Lead  . EKG 12-Lead    IMPRESSION AND PLAN:  Principal Problem:   Acute hyperkalemia -patient was given insulin, placed on a bicarb drip, given a large dose of Veltassa.  Will monitor his potassium closely.  Nephrology is aware of him and will dialyze him tonight if his potassium is not corrected quickly enough. Active Problems:   AKI (acute kidney injury) ([place]) -unclear etiology for his severe AKI.  Nephrology on board as above   Complete heart block ([place]) -suspect this is possibly due to his potassium.  Taking corrective measures as above.  If his heart block  does not improve as his potassium comes down he will need full cardiology consult and evaluation.  Initial cardiac work-up in the ED was within normal limits except for his heart block   HTN (hypertension) -hold antihypertensives for now   HLD (hyperlipidemia) -continue home meds   Seizures ([place]) -continue home medications  Chart review performed and case discussed with ED provider. Labs, imaging and/or ECG reviewed by provider and discussed with patient/family. Management plans discussed with the patient and/or family.  COVID-19 status: Tested negative     DVT PROPHYLAXIS: SubQ heparin  GI PROPHYLAXIS:  None  ADMISSION STATUS: Inpatient     CODE STATUS: Full Code Status History    Date Active Date Inactive Code Status Order ID Comments User Context   06/29/2017 0903 06/29/2017 1320 Full Code [id number]  [name], MD Inpatient       TOTAL CRITICAL CARE TIME TAKING CARE OF THIS PATIENT: 50 minutes.   This patient was evaluated in the context of the global COVID-19 pandemic, which necessitated consideration that the patient might be at risk for infection with the SARS-CoV-2 virus that causes COVID-19. Institutional protocols and algorithms that pertain to the evaluation of patients at risk for COVID-19 are in a state of rapid change based on information released by regulatory bodies including the CDC and federal and state organizations. These policies and algorithms were followed to the best of this provider's knowledge to date during the patient's care at this facility.  Ethlyn Daniels 08/24/2018, 11:17 PM  [facility]  Office  [phone]  CC: Primary care physician; Petra Kuba, MD  Note:  This document was prepared using Dragon voice recognition software and may include unintentional dictation errors.

## 2018-08-24 NOTE — ED Notes (Signed)
Critical pt potassium of 7.3 reported to Dr. Joni Fears, [place].

## 2018-08-25 ENCOUNTER — Inpatient Hospital Stay: Payer: BLUE CROSS/BLUE SHIELD

## 2018-08-25 ENCOUNTER — Encounter
Admission: EM | Disposition: A | Discharge status: Home / Self Care | Payer: Self-pay | Source: Home / Self Care | Attending: Internal Medicine

## 2018-08-25 DIAGNOSIS — I442 Atrioventricular block, complete: Secondary | ICD-10-CM

## 2018-08-25 HISTORY — PX: TEMPORARY PACEMAKER: CATH118268

## 2018-08-25 LAB — MAGNESIUM: Magnesium: 1.8 mg/dL (ref 1.7–2.4)

## 2018-08-25 LAB — COMPREHENSIVE METABOLIC PANEL
ALT: 51 U/L — ABNORMAL HIGH (ref 0–44)
AST: 29 U/L (ref 15–41)
Albumin: 3.6 g/dL (ref 3.5–5.0)
Alkaline Phosphatase: 70 U/L (ref 38–126)
Anion gap: 11 (ref 5–15)
BUN: 46 mg/dL — ABNORMAL HIGH (ref 6–20)
CO2: 27 mmol/L (ref 22–32)
Calcium: 9.6 mg/dL (ref 8.9–10.3)
Chloride: 102 mmol/L (ref 98–111)
Creatinine, Ser: 2.94 mg/dL — ABNORMAL HIGH (ref 0.61–1.24)
GFR calc Af Amer: 26 mL/min — ABNORMAL LOW (ref >60)
GFR calc non Af Amer: 22 mL/min — ABNORMAL LOW (ref >60)
Glucose, Bld: 120 mg/dL — ABNORMAL HIGH (ref 70–99)
Potassium: 4.6 mmol/L (ref 3.5–5.1)
Sodium: 140 mmol/L (ref 135–145)
Total Bilirubin: 0.9 mg/dL (ref 0.3–1.2)
Total Protein: 6.6 g/dL (ref 6.5–8.1)

## 2018-08-25 LAB — URINALYSIS, COMPLETE (UACMP) WITH MICROSCOPIC
Bacteria, UA: NONE SEEN
Bilirubin Urine: NEGATIVE
Glucose, UA: NEGATIVE mg/dL
Ketones, ur: NEGATIVE mg/dL
Leukocytes,Ua: NEGATIVE
Nitrite: NEGATIVE
Protein, ur: 30 mg/dL — AB
Specific Gravity, Urine: 1.006 (ref 1.005–1.030)
pH: 6 (ref 5.0–8.0)

## 2018-08-25 LAB — RENAL FUNCTION PANEL
Albumin: 3.8 g/dL (ref 3.5–5.0)
Anion gap: 10 (ref 5–15)
BUN: 37 mg/dL — ABNORMAL HIGH (ref 6–20)
CO2: 29 mmol/L (ref 22–32)
Calcium: 9.1 mg/dL (ref 8.9–10.3)
Chloride: 101 mmol/L (ref 98–111)
Creatinine, Ser: 2.46 mg/dL — ABNORMAL HIGH (ref 0.61–1.24)
GFR calc Af Amer: 32 mL/min — ABNORMAL LOW (ref >60)
GFR calc non Af Amer: 28 mL/min — ABNORMAL LOW (ref >60)
Glucose, Bld: 127 mg/dL — ABNORMAL HIGH (ref 70–99)
Phosphorus: 2.7 mg/dL (ref 2.5–4.6)
Potassium: 4.3 mmol/L (ref 3.5–5.1)
Sodium: 140 mmol/L (ref 135–145)

## 2018-08-25 LAB — CBC
HCT: 32.2 % — ABNORMAL LOW (ref 39.0–52.0)
Hemoglobin: 10.9 g/dL — ABNORMAL LOW (ref 13.0–17.0)
MCH: 29.8 pg (ref 26.0–34.0)
MCHC: 33.9 g/dL (ref 30.0–36.0)
MCV: 88 fL (ref 80.0–100.0)
Platelets: 230 10*3/uL (ref 150–400)
RBC: 3.66 MIL/uL — ABNORMAL LOW (ref 4.22–5.81)
RDW: 13.8 % (ref 11.5–15.5)
WBC: 14.4 10*3/uL — ABNORMAL HIGH (ref 4.0–10.5)
nRBC: 0 % (ref 0.0–0.2)

## 2018-08-25 LAB — GLUCOSE, CAPILLARY: Glucose-Capillary: 98 mg/dL (ref 70–99)

## 2018-08-25 LAB — AMMONIA: Ammonia: <9 umol/L — ABNORMAL LOW (ref 9–35)

## 2018-08-25 LAB — LACTIC ACID, PLASMA
Lactic Acid, Venous: 2.1 mmol/L (ref 0.5–1.9)
Lactic Acid, Venous: 2.6 mmol/L (ref 0.5–1.9)

## 2018-08-25 LAB — PROCALCITONIN: Procalcitonin: 0.31 ng/mL

## 2018-08-25 LAB — PHOSPHORUS: Phosphorus: 5.7 mg/dL — ABNORMAL HIGH (ref 2.5–4.6)

## 2018-08-25 LAB — POTASSIUM: Potassium: 6.6 mmol/L (ref 3.5–5.1)

## 2018-08-25 LAB — MRSA PCR SCREENING: MRSA by PCR: NEGATIVE

## 2018-08-25 SURGERY — TEMPORARY PACEMAKER

## 2018-08-25 MED ORDER — CALCIUM GLUCONATE 10 % IV SOLN
2.0000 g | Freq: Once | INTRAVENOUS | Status: AC
  Administered 2018-08-25: 2 g via INTRAVENOUS
  Filled 2018-08-25: qty 20

## 2018-08-25 MED ORDER — LIDOCAINE HCL (PF) 1 % IJ SOLN
INTRAMUSCULAR | Status: DC | PRN
  Administered 2018-08-25: 15 mL

## 2018-08-25 MED ORDER — SODIUM CHLORIDE 0.9% FLUSH
3.0000 mL | Freq: Two times a day (BID) | INTRAVENOUS | Status: DC
  Administered 2018-08-26 – 2018-08-29 (×6): 3 mL via INTRAVENOUS

## 2018-08-25 MED ORDER — FENTANYL CITRATE (PF) 100 MCG/2ML IJ SOLN
12.5000 ug | INTRAMUSCULAR | Status: DC | PRN
  Administered 2018-08-25: 12.5 ug via INTRAVENOUS
  Filled 2018-08-25: qty 2

## 2018-08-25 MED ORDER — LABETALOL HCL 5 MG/ML IV SOLN: 10.0000 mg | INTRAVENOUS | Status: AC | PRN

## 2018-08-25 MED ORDER — ACETAMINOPHEN 325 MG PO TABS
650.0000 mg | ORAL_TABLET | Freq: Four times a day (QID) | ORAL | Status: DC | PRN
  Administered 2018-08-28 (×2): 650 mg via ORAL
  Filled 2018-08-25 (×2): qty 2

## 2018-08-25 MED ORDER — HEPARIN (PORCINE) IN NACL 1000-0.9 UT/500ML-% IV SOLN
INTRAVENOUS | Status: DC | PRN
  Administered 2018-08-25: 500 mL

## 2018-08-25 MED ORDER — SODIUM CHLORIDE 0.9 % IV SOLN
3.0000 g | Freq: Four times a day (QID) | INTRAVENOUS | Status: DC
  Administered 2018-08-25 – 2018-08-27 (×6): 3 g via INTRAVENOUS
  Filled 2018-08-25 (×10): qty 3

## 2018-08-25 MED ORDER — CHLORHEXIDINE GLUCONATE CLOTH 2 % EX PADS
6.0000 | MEDICATED_PAD | Freq: Every day | CUTANEOUS | Status: DC
  Administered 2018-08-25 – 2018-08-28 (×4): 6 via TOPICAL
  Filled 2018-08-25: qty 6

## 2018-08-25 MED ORDER — ACETAMINOPHEN 650 MG RE SUPP: 650.0000 mg | Freq: Four times a day (QID) | RECTAL | Status: DC | PRN

## 2018-08-25 MED ORDER — FENTANYL CITRATE (PF) 100 MCG/2ML IJ SOLN
INTRAMUSCULAR | Status: AC
  Filled 2018-08-25: qty 2

## 2018-08-25 MED ORDER — FENTANYL CITRATE (PF) 100 MCG/2ML IJ SOLN
INTRAMUSCULAR | Status: DC | PRN
  Administered 2018-08-25 (×2): 25 ug via INTRAVENOUS

## 2018-08-25 MED ORDER — TUBERCULIN PPD 5 UNIT/0.1ML ID SOLN
5.0000 [IU] | Freq: Once | INTRADERMAL | Status: AC
  Administered 2018-08-25: 03:00:00 5 [IU] via INTRADERMAL
  Filled 2018-08-25: qty 0.1

## 2018-08-25 MED ORDER — FAMOTIDINE IN NACL 20-0.9 MG/50ML-% IV SOLN
20.0000 mg | INTRAVENOUS | Status: DC
  Administered 2018-08-25: 22:00:00 20 mg via INTRAVENOUS
  Filled 2018-08-25: qty 50

## 2018-08-25 MED ORDER — ONDANSETRON HCL 4 MG PO TABS
4.0000 mg | ORAL_TABLET | Freq: Four times a day (QID) | ORAL | Status: DC | PRN
  Administered 2018-08-27: 4 mg via ORAL
  Filled 2018-08-25: qty 1

## 2018-08-25 MED ORDER — ONDANSETRON HCL 4 MG/2ML IJ SOLN
4.0000 mg | Freq: Four times a day (QID) | INTRAMUSCULAR | Status: DC | PRN
  Administered 2018-08-26 (×2): 4 mg via INTRAVENOUS
  Filled 2018-08-25: qty 2

## 2018-08-25 MED ORDER — NOREPINEPHRINE 4 MG/250ML-% IV SOLN
0.0000 ug/min | INTRAVENOUS | Status: DC
  Administered 2018-08-25: 2 ug/min via INTRAVENOUS
  Administered 2018-08-25: 5 ug/min via INTRAVENOUS
  Filled 2018-08-25 (×2): qty 250

## 2018-08-25 MED ORDER — HYDRALAZINE HCL 20 MG/ML IJ SOLN: 10.0000 mg | INTRAMUSCULAR | Status: AC | PRN

## 2018-08-25 MED ORDER — ATROPINE SULFATE 1 MG/10ML IJ SOSY
1.0000 mg | PREFILLED_SYRINGE | Freq: Once | INTRAMUSCULAR | Status: AC
  Administered 2018-08-25: 1 mg via INTRAVENOUS

## 2018-08-25 MED ORDER — MIDAZOLAM HCL 2 MG/2ML IJ SOLN
INTRAMUSCULAR | Status: AC
  Filled 2018-08-25: qty 2

## 2018-08-25 MED ORDER — ATROPINE SULFATE 1 MG/10ML IJ SOSY
PREFILLED_SYRINGE | INTRAMUSCULAR | Status: AC
  Filled 2018-08-25: qty 10

## 2018-08-25 MED ORDER — HEPARIN SODIUM (PORCINE) 5000 UNIT/ML IJ SOLN
5000.0000 [IU] | Freq: Three times a day (TID) | INTRAMUSCULAR | Status: DC
  Administered 2018-08-25 – 2018-08-29 (×10): 5000 [IU] via SUBCUTANEOUS
  Filled 2018-08-25 (×10): qty 1

## 2018-08-25 MED ORDER — SODIUM CHLORIDE 0.9% FLUSH: 3.0000 mL | INTRAVENOUS | Status: DC | PRN

## 2018-08-25 MED ORDER — CALCIUM ACETATE (PHOS BINDER) 667 MG PO CAPS
667.0000 mg | ORAL_CAPSULE | Freq: Three times a day (TID) | ORAL | Status: DC
  Administered 2018-08-26 – 2018-08-27 (×2): 667 mg via ORAL
  Filled 2018-08-25 (×9): qty 1

## 2018-08-25 MED ORDER — SODIUM CHLORIDE 0.9 % IV SOLN
250.0000 mL | INTRAVENOUS | Status: DC | PRN
  Administered 2018-08-27: 250 mL via INTRAVENOUS

## 2018-08-25 MED ORDER — MIDAZOLAM HCL 2 MG/2ML IJ SOLN
INTRAMUSCULAR | Status: DC | PRN
  Administered 2018-08-25 (×2): 1 mg via INTRAVENOUS

## 2018-08-25 MED ORDER — PNEUMOCOCCAL VAC POLYVALENT 25 MCG/0.5ML IJ INJ
0.5000 mL | INJECTION | INTRAMUSCULAR | Status: AC
  Administered 2018-08-28: 0.5 mL via INTRAMUSCULAR
  Filled 2018-08-25: qty 0.5

## 2018-08-25 SURGICAL SUPPLY — 5 items
CABLE ADAPT PACING TEMP 12FT (ADAPTER) ×3 IMPLANT
NEEDLE PERC 18GX7CM (NEEDLE) ×3 IMPLANT
PACK CARDIAC CATH (CUSTOM PROCEDURE TRAY) ×3 IMPLANT
SHEATH AVANTI 6FR X 11CM (SHEATH) ×3 IMPLANT
WIRE PACING TEMP ST TIP 5 (CATHETERS) ×3 IMPLANT

## 2018-08-25 NOTE — Consult Note (Signed)
CRITICAL CARE NOTE      CHIEF COMPLAINT:   Acute respiratory distress with severe bradycardia.   Re  HPI   This is a pleasant 58 yo male with hx of CKD, gout, anxiety disorder, essential HTN, seizure disorder, who was in his usual state of health and had been at home with his wife at home watching TV when he felt dizzines.  He tried to walk around but fell back in chair and then asked his wife to call EMS.  He subsequently took anxiety medication and felt better so EMS was cancelled however patient subsequently started vomiting, he is unsure weather he had aspirated but endorses regurgitation and possible aspiration in past which is why he stopped drinking soda. Wife then called EMS for second time and he was brought to ED with findings of symptomatic bradycardia and respiratory distress.  Nephrology was emergently consulted and patient received RRT while in ED for severe metabolic derrangements including hyperkalemia.  He was seen by hospitalist service and critical care consultation was placed for upgrade in care to manage symptomatic arrythmia, severe hyperkalemia, respiratory distress.     Reevaluated patient he is again symptomatic with bradycardia arrhythmia 20s to 30s, EKG done showing Wenckebach with telemetry intermittently third-degree block.  Discussed with Dr. Ubaldo Glassing cardiology was asked to reach out to interventionalists.  Discussed with Dr. Burt Knack interventional cardiology, plan for STEMI team to take for temporary pacemaker today.  PAST MEDICAL HISTORY   Past Medical History:  Diagnosis Date  . Anxiety   . Hyperlipidemia   . Hypertension   . Seizures ([facility])      SURGICAL HISTORY   Past Surgical History:  Procedure Laterality Date  . KNEE SURGERY Left   . LEFT HEART CATH AND CORONARY ANGIOGRAPHY  Left 06/29/2017   Procedure: LEFT HEART CATH AND CORONARY ANGIOGRAPHY;  Surgeon: [name], MD;  Location: [facility];  Service: Cardiovascular;  Laterality: Left;     FAMILY HISTORY   History reviewed. No pertinent family history.   SOCIAL HISTORY   Social History   Tobacco Use  . Smoking status: Never Smoker  . Smokeless tobacco: Never Used  Substance Use Topics  . Alcohol use: No  . Drug use: No     MEDICATIONS   Current Medication:  Current Facility-Administered Medications:  .  0.9 %  sodium chloride infusion, , Intravenous, Continuous, Lance Coon, MD, Stopped at 08/25/18 [phone] .  acetaminophen (TYLENOL) tablet 650 mg, 650 mg, Oral, Q6H PRN **OR** acetaminophen (TYLENOL) suppository 650 mg, 650 mg, Rectal, Q6H PRN, Lance Coon, MD .  Chlorhexidine Gluconate Cloth 2 % PADS 6 each, 6 each, Topical, Q0600, Kolluru, Sarath, MD, 6 each at 08/25/18 0818 .  heparin injection 5,000 Units, 5,000 Units, Subcutaneous, Q8H, Lance Coon, MD .  norepinephrine (LEVOPHED) 4mg  in 237mL premix infusion, 0-40 mcg/min, Intravenous, Continuous, Kolluru, Sarath, MD, Last Rate: 15 mL/hr at 08/25/18 0840, 4 mcg/min at 08/25/18 0840 .  ondansetron (ZOFRAN) tablet 4 mg, 4 mg, Oral, Q6H PRN **OR** ondansetron (ZOFRAN) injection 4 mg, 4 mg, Intravenous, Q6H PRN, Lance Coon, MD .  Derrill Memo ON 08/26/2018] pneumococcal 23 valent vaccine (PNU-IMMUNE) injection 0.5 mL, 0.5 mL, Intramuscular, Tomorrow-1000, Kameka Whan, MD .  sodium bicarbonate 150 mEq in dextrose 5 % 1,000 mL infusion, , Intravenous, Continuous, Lance Coon, MD, Last Rate: 168 mL/hr at 08/24/18 2246 .  tuberculin injection 5 Units, 5 Units, Intradermal, Once, Kolluru, Sarath, MD, 5 Units at 08/25/18 [phone]  ALLERGIES   Ibuprofen    REVIEW OF SYSTEMS     10 point ROS done and is negative except as per HPI  PHYSICAL EXAMINATION   Vitals:   08/25/18 0611 08/25/18 0803  BP: (!) 168/53 (!) 131/53   Pulse: (!) 38 (!) 43  Resp: (!) 24 (!) 27  Temp:  98.3 F (36.8 C)  SpO2: 96% 93%    GENERAL:mild sob able at rest to speak in fragments HEAD: Normocephalic, atraumatic.  EYES: Pupils equal, round, reactive to light.  No scleral icterus.  MOUTH: Moist mucosal membrane. NECK: Supple. No thyromegaly. No nodules. No JVD.  PULMONARY: bibasilar crackles CARDIOVASCULAR: S1 and S2. Regular rate and rhythm. No murmurs, rubs, or gallops.  GASTROINTESTINAL: Soft, nontender, non-distended. No masses. Positive bowel sounds. No hepatosplenomegaly.  MUSCULOSKELETAL: No swelling, clubbing, or edema.  NEUROLOGIC: Mild distress due to acute illness SKIN:intact,warm,dry   LABS AND IMAGING     LAB RESULTS: Recent Labs  Lab 08/24/18 2042 08/25/18 0016 08/25/18 0436  NA 135  --  140  K 7.3* 6.6* 4.3  CL 104  --  101  CO2 18*  --  29  BUN 58*  --  37*  CREATININE 3.40*  --  2.46*  GLUCOSE 158*  --  127*   Recent Labs  Lab 08/24/18 2042  HGB 12.0*  HCT 36.8*  WBC 17.5*  PLT 381     IMAGING RESULTS: US Renal  Result Date: 08/25/2018 CLINICAL DATA:  Acute kidney failure EXAM: RENAL / URINARY TRACT ULTRASOUND COMPLETE COMPARISON:  None. FINDINGS: Right Kidney: Renal measurements: 10.9 x 5.3 x 5.2 cm = volume: 155.5 mL. 1.3 x 1.4 x 1.5 cm lower pole simple cyst. No hydronephrosis. Left Kidney: Renal measurements: 11.6 x 5.6 x 6.4 cm = volume: 216.2 mL. No mass or hydronephrosis. Bladder: Appears normal for degree of bladder distention. IMPRESSION: No hydronephrosis. 1.5 cm right lower pole simple cyst, benign. Electronically Signed   By: Julian Hy M.D.   On: 08/25/2018 07:32      ASSESSMENT AND PLAN    -Multidisciplinary rounds held today    Symptomatic bradyarrythmia      -improved - patient has no chest discomfort/presyncopy/SOB but remains bradycardic  -cardiology on case appreciate input - no plan for PPM at this time -oxygen as needed -Lasix as tolerated -follow  up cardiac enzymes as indicated -ICU monitoring   Renal Failure-Acute on chronic Stage II    Nephrology on case appreciate input  -s/p HD -follow chem 7 -follow UO -continue Foley Catheter-assess need daily   Possible Aspiration Pneumonia    - patient reports vomiting prior to SOB     - leukocytosis with left shift     - blood cultures ordered,  PCT trending      -CXR reviewed by me - no appreciable infiltrate        Severe hyperkalemia     S/p emergent dialysis   - resolved    Severe hyperphosphatemia    - phoslo    - pharmacy consult for metabolic derrangement    DVT/GI PRX ordered -SCDs  TRANSFUSIONS AS NEEDED MONITOR FSBS ASSESS the need for LABS as needed    Critical care provider statement:    Critical care time (minutes):  111   Critical care time was exclusive of:  Separately billable procedures and treating other patients   Critical care was necessary to treat or prevent imminent or life-threatening deterioration of the following conditions:  Symptomatic bradyarrythmia, hyperkalmia,  hyperphosphatemia,    Critical care was time spent personally by me on the following activities:  Development of treatment plan with patient or surrogate, discussions with consultants, evaluation of patient's response to treatment, examination of patient, obtaining history from patient or surrogate, ordering and performing treatments and interventions, ordering and review of laboratory studies and re-evaluation of patient's condition.  I assumed direction of critical care for this patient from another provider in my specialty: no    This document was prepared using Dragon voice recognition software and may include unintentional dictation errors.    Ottie Glazier, M.D.  Division of [facility]

## 2018-08-25 NOTE — Plan of Care (Signed)
New admission, vs improving.  No pain

## 2018-08-25 NOTE — Progress Notes (Signed)
Dr. Lanney Gins notified that patient is dizzy, blurry vision, feels like he did when he came in.  12 lead EKG done and given to Dr. Lanney Gins.  Cardiology notified by Dr. Lanney Gins.

## 2018-08-25 NOTE — Consult Note (Signed)
Cardiology Consultation Note    Patient ID: Edward Pitts, MRN: [id number], DOB/AGE: 1960-10-08 58 y.o. Admit date: 08/24/2018   Date of Consult: 08/25/2018 Primary Physician: Petra Kuba, MD Primary Cardiologist: Dr. Nehemiah Massed  Chief Complaint: hyperkalemia Reason for Consultation: bradycardia Requesting MD: Dr. Margaretmary Eddy  HPI: Edward Pitts is a 58 y.o. male with history of hyperlipidemia-treated with atorvastatin, hypertension-treated with metoprolol and lisinopril, admitted with acute kidney injury and profound hyperkalemia. Consult called for bradycardia. Initial ekg showed chb with ventricular escape at 19. K was 7.3 at the time. BUN/ Cr was 58 and 3.4. Baseline creatinine was 1.65. Patient had cardiac cath done recently showing no significant obstructive cad. He had reported dizziness and syncope intermitantly over the past few weeks. Has EF of 45 % by echo 1 year ago. Holter done in  March of 2020 showed nsr, asymptomatic bradycardia with no reported heart block. At that time he was medically treated. He states he has been eating and drinking normally.  Received emergent dialysis yesterday.  Potassium has improved.  Currently in sinus bradycardia with rates of 38-41 with transient high-grade heart block.  Patient denies noncompliance with medicine.  He has had no diarrhea or nausea until day of admission.  Past Medical History:  Diagnosis Date  . Anxiety   . Hyperlipidemia   . Hypertension   . Seizures [facility])       Surgical History:  Past Surgical History:  Procedure Laterality Date  . KNEE SURGERY Left   . LEFT HEART CATH AND CORONARY ANGIOGRAPHY Left 06/29/2017   Procedure: LEFT HEART CATH AND CORONARY ANGIOGRAPHY;  Surgeon: [name], MD;  Location: [facility];  Service: Cardiovascular;  Laterality: Left;     Home Meds: Prior to Admission medications   Medication Sig Start Date End Date Taking? Authorizing Provider  allopurinol (ZYLOPRIM) 300 MG tablet  Take 300 mg by mouth daily. 06/12/18  Yes [provider]  atorvastatin (LIPITOR) 20 MG tablet Take 20 mg by mouth daily. 07/15/18  Yes [provider]  clonazePAM (KLONOPIN) 0.5 MG tablet 1 tablet in the morning, 2 in the evening 06/27/18  Yes Kathrynn Ducking, MD  hydrocortisone 2.5 % cream Apply 1 application topically daily as needed (for itching).    Yes [provider]  ketoconazole (NIZORAL) 2 % cream Apply 1 application topically daily as needed for irritation.    Yes [provider]  lisinopril (ZESTRIL) 20 MG tablet Take 20 mg by mouth daily.   Yes [provider]  metoprolol succinate (TOPROL-XL) 25 MG 24 hr tablet Take 25 mg by mouth daily.   Yes [provider]  Multiple Vitamins-Minerals (CENTRUM SILVER PO) Take 1 tablet by mouth daily.   Yes [provider]    Inpatient Medications:  . Chlorhexidine Gluconate Cloth  6 each Topical Q0600  . heparin  5,000 Units Subcutaneous Q8H  . [START ON 08/26/2018] pneumococcal 23 valent vaccine  0.5 mL Intramuscular Tomorrow-1000  . tuberculin  5 Units Intradermal Once   . sodium chloride Stopped (08/25/18 0427)  . norepinephrine (LEVOPHED) Adult infusion 4 mcg/min (08/25/18 0840)  .  sodium bicarbonate  infusion 1000 mL 168 mL/hr at 08/24/18 2246    Allergies:  Allergies  Allergen Reactions  . Ibuprofen Other (See Comments)    Affects kidneys    Social History   Socioeconomic History  . Marital status: Married    Spouse name: Not on file  . Number of children: Not on  file  . Years of education: 80  . Highest education level: Not on file  Occupational History  . Occupation: [employer]   Social Needs  . Financial resource strain: Not on file  . Food insecurity:    Worry: Not on file    Inability: Not on file  . Transportation needs:    Medical: Not on file    Non-medical: Not on file  Tobacco Use  . Smoking status: Never Smoker  . Smokeless tobacco: Never Used   Substance and Sexual Activity  . Alcohol use: No  . Drug use: No  . Sexual activity: Not on file  Lifestyle  . Physical activity:    Days per week: Not on file    Minutes per session: Not on file  . Stress: Not on file  Relationships  . Social connections:    Talks on phone: Not on file    Gets together: Not on file    Attends religious service: Not on file    Active member of club or organization: Not on file    Attends meetings of clubs or organizations: Not on file    Relationship status: Not on file  . Intimate partner violence:    Fear of current or ex partner: Not on file    Emotionally abused: Not on file    Physically abused: Not on file    Forced sexual activity: Not on file  Other Topics Concern  . Not on file  Social History Narrative   Lives with mother, wife and sister   Caffeine use: sodas (2 per day)     History reviewed. No pertinent family history.   Review of Systems: A 12-system review of systems was performed and is negative except as noted in the HPI.  Labs: Recent Labs    08/24/18 2042  TROPONINI <0.03   Lab Results  Component Value Date   WBC 17.5 (H) 08/24/2018   HGB 12.0 (L) 08/24/2018   HCT 36.8 (L) 08/24/2018   MCV 90.2 08/24/2018   PLT 381 08/24/2018    Recent Labs  Lab 2018/09/09 0436  NA 140  K 4.3  CL 101  CO2 29  BUN 37*  CREATININE 2.46*  CALCIUM 9.1  GLUCOSE 127*   No results found for: CHOL, HDL, LDLCALC, TRIG No results found for: DDIMER  Radiology/Studies:  US Renal  Result Date: 09-Sep-2018 CLINICAL DATA:  Acute kidney failure EXAM: RENAL / URINARY TRACT ULTRASOUND COMPLETE COMPARISON:  None. FINDINGS: Right Kidney: Renal measurements: 10.9 x 5.3 x 5.2 cm = volume: 155.5 mL. 1.3 x 1.4 x 1.5 cm lower pole simple cyst. No hydronephrosis. Left Kidney: Renal measurements: 11.6 x 5.6 x 6.4 cm = volume: 216.2 mL. No mass or hydronephrosis. Bladder: Appears normal for degree of bladder distention. IMPRESSION: No  hydronephrosis. 1.5 cm right lower pole simple cyst, benign. Electronically Signed   By: Julian Hy M.D.   On: 09-09-18 07:32   Dg Knee Complete 4 Views Right  Result Date: 08/10/2018 CLINICAL DATA:  Acute onset of RIGHT knee pain that began last night. No known injuries, though the patient describes crepitus with a popping sensation approximately 1 week ago. Patient unable to fully extend the knee. EXAM: RIGHT KNEE - COMPLETE 4+ VIEW COMPARISON:  None. FINDINGS: No evidence of acute, subacute or healed fractures. Calcified loose body in the superior joint space. Small joint effusion. Joint spaces well-preserved. Enthesopathic spur is at the insertion of the quadriceps tendon on the superior patella and  the patellar tendon on the inferior patella. IMPRESSION: 1. No acute or subacute osseous abnormality. 2. Calcified loose body in the superior joint space. 3. Small joint effusion. Electronically Signed   By: Evangeline Dakin M.D.   On: 08/10/2018 21:51   Vas US Carotid  Result Date: 08/01/2018 Carotid Arterial Duplex Study Indications: Syncope. Performing Technologist: June Leap RDMS, RVT  Examination Guidelines: A complete evaluation includes B-mode imaging, spectral Doppler, color Doppler, and power Doppler as needed of all accessible portions of each vessel. Bilateral testing is considered an integral part of a complete examination. Limited examinations for reoccurring indications may be performed as noted.  Right Carotid Findings: +----------+--------+--------+--------+--------+------------------+           PSV cm/sEDV cm/sStenosisDescribeComments           +----------+--------+--------+--------+--------+------------------+ CCA Prox  160     25                      intimal thickening +----------+--------+--------+--------+--------+------------------+ CCA Distal165     24                                          +----------+--------+--------+--------+--------+------------------+ ICA Prox  125     33                      intimal thickening +----------+--------+--------+--------+--------+------------------+ ICA Distal126     39                                         +----------+--------+--------+--------+--------+------------------+ ECA       114     14                                         +----------+--------+--------+--------+--------+------------------+ +----------+--------+-------+----------------+-------------------+           PSV cm/sEDV cmsDescribe        Arm Pressure (mmHG) +----------+--------+-------+----------------+-------------------+ [id number]            Multiphasic, WNL                    +----------+--------+-------+----------------+-------------------+ +---------+--------+--+--------+--+---------+ VertebralPSV cm/s51EDV cm/s13Antegrade +---------+--------+--+--------+--+---------+  Left Carotid Findings: +----------+--------+--------+--------+--------+------------------+           PSV cm/sEDV cm/sStenosisDescribeComments           +----------+--------+--------+--------+--------+------------------+ CCA Prox  171     34                      intimal thickening +----------+--------+--------+--------+--------+------------------+ CCA Distal177     30                                         +----------+--------+--------+--------+--------+------------------+ ICA Prox  153     30                                         +----------+--------+--------+--------+--------+------------------+ ICA Distal92      30                                         +----------+--------+--------+--------+--------+------------------+  ECA       191     23                                         +----------+--------+--------+--------+--------+------------------+ +----------+--------+--------+----------------+-------------------+ SubclavianPSV cm/sEDV  cm/sDescribe        Arm Pressure (mmHG) +----------+--------+--------+----------------+-------------------+           168             Multiphasic, WNL                    +----------+--------+--------+----------------+-------------------+ +---------+--------+--+--------+--+---------+ VertebralPSV cm/s53EDV cm/s10Antegrade +---------+--------+--+--------+--+---------+  Summary: Right Carotid: The extracranial vessels were near-normal with only minimal wall                thickening or plaque. Left Carotid: The extracranial vessels were near-normal with only minimal wall               thickening or plaque.  *See table(s) above for measurements and observations.  Electronically signed by Servando Snare MD on 08/01/2018 at 4:25:57 PM.    Final     Wt Readings from Last 3 Encounters:  08/25/18 104.9 kg  08/10/18 102.1 kg  05/21/18 104.9 kg    EKG: Sinus bradycardia with first-degree AV block with intermittent third-degree AV block.  Physical Exam:  Blood pressure (!) 131/53, pulse (!) 43, temperature 98.3 F (36.8 C), temperature source Oral, resp. rate (!) 27, height 5\' 7"  (1.702 m), weight 104.9 kg, SpO2 93 %. Body mass index is 36.22 kg/m. General: Well developed, well nourished, in no acute distress. Head: Normocephalic, atraumatic, sclera non-icteric, no xanthomas, nares are without discharge.  Neck: Negative for carotid bruits. JVD not elevated. Lungs: Clear bilaterally to auscultation without wheezes, rales, or rhonchi. Breathing is unlabored. Heart: RRR with S1 S2. No murmurs, rubs, or gallops appreciated. Abdomen: Soft, non-tender, non-distended with normoactive bowel sounds. No hepatomegaly. No rebound/guarding. No obvious abdominal masses. Msk:  Strength and tone appear normal for age. Extremities: No clubbing or cyanosis. No edema.  Distal pedal pulses are 2+ and equal bilaterally. Neuro: Alert and oriented X 3. No facial asymmetry. No focal deficit. Moves all extremities  spontaneously. Psych:  Responds to questions appropriately with a normal affect.     Assessment and Plan  58 year old male with history of syncope in the past now admitted with acute on chronic renal insufficiency.  His baseline creatinine is 1.5-1.6.  Was markedly elevated at 3.4 on admission with potassium of 7.3.  Had complete heart block on admission.  After hemodialysis and improvement of his serum potassium, he has resumed for the most part AV conduction although still is bradycardic.  Has been on metoprolol as an outpatient.  Would continue to hold the metoprolol.  Follow serum potassium.  As cellular potassium normalizes, will follow heart rate.  If he remains symptomatically bradycardic after resolution of profound hyperkalemia, consideration for permanent pacemaker.  Does not appear to require temporary pacemaker at present.  Signed, Teodoro Spray MD 08/25/2018, 9:41 AM Pager: [phone]

## 2018-08-25 NOTE — Consult Note (Signed)
[facility]  CONSULT NOTE    Date: 08/25/2018                  Patient Name:  Edward Pitts  MRN: [id number]  DOB: 1961/02/15  Age / Sex: 58 y.o., male         PCP: Petra Kuba, MD                 Service Requesting Consult: Dr. Jannifer Franklin                 Reason for Consult: Acute renal failure with hyperkalemia            History of Present Illness: [name] is a 58 y.o. white male with hypertension, hyperlipidemia, anxiety, seizure disorder, gout, osteoarthritis, left bundle branch block who was admitted to [facility] on 08/24/2018 for shortness of breath  Patient is able to give a reliable history. Patient states he had two syncopal episodes on Saturday. Then had shortness of breath with nausea. EMS found patient with 3rd degree heart block with bradycardia, heart rate in the 20s. Patient was given  Atropine with no improvement. Found to have hyperkalemia. Placed on albuterol. Calcium gluconate, dextrose, insulin, sodium bicarb and [place]. No improvement in cardiac status and minimal improvement in potassium.   Nephrology consulted for urgent dialysis. Right femoral temp HD catheter placed by the ED. Started on emergent hemodialysis treatment 1K bath. Patient states that he is doing somewhat better.   Started on norepinephrine infusion for hypotension.   Patient is unsure if he had a seizure. He is not post-ictal.   Medications: Outpatient medications: (Not in a hospital admission)   Current medications: Current Facility-Administered Medications  Medication Dose Route Frequency Provider Last Rate Last Dose  . 0.9 %  sodium chloride infusion   Intravenous Continuous Lance Coon, MD   Stopped at 08/25/18 [phone]  . Chlorhexidine Gluconate Cloth 2 % PADS 6 each  6 each Topical Q0600 Sierrah Luevano, MD      . norepinephrine (LEVOPHED) 4mg  in 258mL premix infusion  0-40 mcg/min Intravenous Continuous Ambika Zettlemoyer, MD 37.5 mL/hr at 08/25/18 0428 10  mcg/min at 08/25/18 0428  . sodium bicarbonate 150 mEq in dextrose 5 % 1,000 mL infusion   Intravenous Continuous Carrie Mew, MD 168 mL/hr at 08/24/18 2246    . tuberculin injection 5 Units  5 Units Intradermal Once Lavonia Dana, MD   5 Units at 08/25/18 7416   Current Outpatient Medications  Medication Sig Dispense Refill  . allopurinol (ZYLOPRIM) 300 MG tablet Take 300 mg by mouth daily.    Marland Kitchen atorvastatin (LIPITOR) 20 MG tablet Take 20 mg by mouth daily.    . clonazePAM (KLONOPIN) 0.5 MG tablet 1 tablet in the morning, 2 in the evening 90 tablet 3  . hydrocortisone 2.5 % cream Apply 1 application topically daily as needed (for itching).     Marland Kitchen ketoconazole (NIZORAL) 2 % cream Apply 1 application topically daily as needed for irritation.     Marland Kitchen lisinopril (ZESTRIL) 20 MG tablet Take 20 mg by mouth daily.    . metoprolol succinate (TOPROL-XL) 25 MG 24 hr tablet Take 25 mg by mouth daily.    . Multiple Vitamins-Minerals (CENTRUM SILVER PO) Take 1 tablet by mouth daily.        Allergies: Allergies  Allergen Reactions  . Ibuprofen Other (See Comments)    Affects kidneys      Past Medical History: Past Medical History:  Diagnosis Date  . Anxiety   . Hyperlipidemia   . Hypertension   . Seizures ([place])      Past Surgical History: Past Surgical History:  Procedure Laterality Date  . KNEE SURGERY Left   . LEFT HEART CATH AND CORONARY ANGIOGRAPHY Left 06/29/2017   Procedure: LEFT HEART CATH AND CORONARY ANGIOGRAPHY;  Surgeon: [name], MD;  Location: [facility];  Service: Cardiovascular;  Laterality: Left;     Family History: History reviewed. No pertinent family history.   Social History: Social History   Socioeconomic History  . Marital status: Married    Spouse name: Not on file  . Number of children: Not on file  . Years of education: 29  . Highest education level: Not on file  Occupational History  . Occupation: [employer]   Social Needs  .  Financial resource strain: Not on file  . Food insecurity:    Worry: Not on file    Inability: Not on file  . Transportation needs:    Medical: Not on file    Non-medical: Not on file  Tobacco Use  . Smoking status: Never Smoker  . Smokeless tobacco: Never Used  Substance and Sexual Activity  . Alcohol use: No  . Drug use: No  . Sexual activity: Not on file  Lifestyle  . Physical activity:    Days per week: Not on file    Minutes per session: Not on file  . Stress: Not on file  Relationships  . Social connections:    Talks on phone: Not on file    Gets together: Not on file    Attends religious service: Not on file    Active member of club or organization: Not on file    Attends meetings of clubs or organizations: Not on file    Relationship status: Not on file  . Intimate partner violence:    Fear of current or ex partner: Not on file    Emotionally abused: Not on file    Physically abused: Not on file    Forced sexual activity: Not on file  Other Topics Concern  . Not on file  Social History Narrative   Lives with mother, wife and sister   Caffeine use: sodas (2 per day)     Review of Systems: Review of Systems  Constitutional: Negative.  Negative for chills, diaphoresis, fever, malaise/fatigue and weight loss.  HENT: Negative.  Negative for congestion, ear discharge, ear pain, hearing loss, nosebleeds, sinus pain, sore throat and tinnitus.   Eyes: Negative.  Negative for blurred vision, double vision, photophobia, pain, discharge and redness.  Respiratory: Positive for shortness of breath. Negative for cough, hemoptysis, sputum production, wheezing and stridor.   Cardiovascular: Negative for chest pain, palpitations, orthopnea, claudication, leg swelling and PND.  Gastrointestinal: Positive for nausea and vomiting. Negative for abdominal pain, blood in stool, constipation, diarrhea, heartburn and melena.  Genitourinary: Negative.  Negative for dysuria, flank pain,  frequency, hematuria and urgency.  Musculoskeletal: Negative.  Negative for back pain, falls, joint pain, myalgias and neck pain.  Skin: Negative.  Negative for itching and rash.  Neurological: Positive for seizures. Negative for dizziness, tingling, tremors, sensory change, speech change, focal weakness, loss of consciousness, weakness and headaches.  Endo/Heme/Allergies: Negative.  Negative for environmental allergies and polydipsia. Does not bruise/bleed easily.  Psychiatric/Behavioral: Negative.  Negative for depression, hallucinations, memory loss, substance abuse and suicidal ideas. The patient is not nervous/anxious and does not have insomnia.  Vital Signs: Blood pressure (!) 144/57, pulse (!) 33, temperature (!) 97.3 F (36.3 C), temperature source Oral, resp. rate (!) 25, height 5\' 7"  (1.702 m), weight 102 kg, SpO2 99 %.  Weight trends: Danley Danker Weights   08/24/18 2034  Weight: 102 kg    Physical Exam: General: NAD, laying on stretcher  Head: Normocephalic, atraumatic. Moist oral mucosal membranes  Eyes: Anicteric, PERRL  Neck: Supple, no JVD  Lungs:  Clear to auscultation  Heart: bradycardia  Abdomen:  Soft, nontender, obese  Extremities: no peripheral edema.  Neurologic: Nonfocal, moving all four extremities  Skin: No lesions  Access: Right femoral temp HD catheter 5/31     Lab results: Basic Metabolic Panel: Recent Labs  Lab 08/24/18 2042 08/25/18 0016  NA 135  --   K 7.3* 6.6*  CL 104  --   CO2 18*  --   GLUCOSE 158*  --   BUN 58*  --   CREATININE 3.40*  --   CALCIUM 8.7*  --   MG 2.0  --   PHOS 4.8*  --     Liver Function Tests: No results for input(s): AST, ALT, ALKPHOS, BILITOT, PROT, ALBUMIN in the last 168 hours. No results for input(s): LIPASE, AMYLASE in the last 168 hours. No results for input(s): AMMONIA in the last 168 hours.  CBC: Recent Labs  Lab 08/24/18 2042  WBC 17.5*  NEUTROABS 13.3*  HGB 12.0*  HCT 36.8*  MCV 90.2  PLT 381     Cardiac Enzymes: Recent Labs  Lab 08/24/18 2042  TROPONINI <0.03    BNP: Invalid input(s): POCBNP  CBG: No results for input(s): GLUCAP in the last 168 hours.  Microbiology: Results for orders placed or performed during the hospital encounter of 08/24/18  SARS Coronavirus 2 (CEPHEID - Performed in [place] hospital lab), Hosp Order     Status: None   Collection Time: 08/24/18  8:42 PM  Result Value Ref Range Status   SARS Coronavirus 2 NEGATIVE NEGATIVE Final    Comment: (NOTE) If result is NEGATIVE SARS-CoV-2 target nucleic acids are NOT DETECTED. The SARS-CoV-2 RNA is generally detectable in upper and lower  respiratory specimens during the acute phase of infection. The lowest  concentration of SARS-CoV-2 viral copies this assay can detect is 250  copies / mL. A negative result does not preclude SARS-CoV-2 infection  and should not be used as the sole basis for treatment or other  patient management decisions.  A negative result may occur with  improper specimen collection / handling, submission of specimen other  than nasopharyngeal swab, presence of viral mutation(s) within the  areas targeted by this assay, and inadequate number of viral copies  (<250 copies / mL). A negative result must be combined with clinical  observations, patient history, and epidemiological information. If result is POSITIVE SARS-CoV-2 target nucleic acids are DETECTED. The SARS-CoV-2 RNA is generally detectable in upper and lower  respiratory specimens dur ing the acute phase of infection.  Positive  results are indicative of active infection with SARS-CoV-2.  Clinical  correlation with patient history and other diagnostic information is  necessary to determine patient infection status.  Positive results do  not rule out bacterial infection or co-infection with other viruses. If result is PRESUMPTIVE POSTIVE SARS-CoV-2 nucleic acids MAY BE PRESENT.   A presumptive positive result was  obtained on the submitted specimen  and confirmed on repeat testing.  While 2019 novel coronavirus  (SARS-CoV-2) nucleic acids may be present  in the submitted sample  additional confirmatory testing may be necessary for epidemiological  and / or clinical management purposes  to differentiate between  SARS-CoV-2 and other Sarbecovirus currently known to infect humans.  If clinically indicated additional testing with an alternate test  methodology [phone]) is advised. The SARS-CoV-2 RNA is generally  detectable in upper and lower respiratory sp ecimens during the acute  phase of infection. The expected result is Negative. Fact Sheet for Patients:  StrictlyIdeas.no Fact Sheet for Healthcare Providers: BankingDealers.co.za This test is not yet approved or cleared by the [place] FDA and has been authorized for detection and/or diagnosis of SARS-CoV-2 by FDA under an Emergency Use Authorization (EUA).  This EUA will remain in effect (meaning this test can be used) for the duration of the COVID-19 declaration under Section 564(b)(1) of the Act, 21 U.S.C. section 360bbb-3(b)(1), unless the authorization is terminated or revoked sooner. Performed at [facility], [address] [place], [place] [address]     Coagulation Studies: No results for input(s): LABPROT, INR in the last 72 hours.  Urinalysis: No results for input(s): COLORURINE, LABSPEC, PHURINE, GLUCOSEU, HGBUR, BILIRUBINUR, KETONESUR, PROTEINUR, UROBILINOGEN, NITRITE, LEUKOCYTESUR in the last 72 hours.  Invalid input(s): APPERANCEUR    Imaging:  No results found.   Assessment & Plan: Mr. Rodderick Holtzer is a 58 y.o. white male with hypertension, hyperlipidemia, anxiety, seizure disorder, gout, osteoarthritis, left bundle branch block who was admitted to [facility] on 08/24/2018 for shortness of breath  1. Acute renal failure with hyperkalemia and metabolic acidosis:  requiring emergent hemodialysis treatment with 1K bath in emergency department.  - Check post treatment potassium level.  - Monitor for dialysis need.  - Continue bicarb infusion  3. Chronic Kidney Disease stage III: baseline creatinine of 1.65, GFR of 45 on 11/21/2017. Urine studies were bland.  Chronic Kidney Disease secondary to hypertension - Check renal ultrasound  4. Hypotension: cardiogenic shock. Requiring norepinephrine.   5. New onset bradycardia with 3rd degree AV Block. On metoprolol at home.  Will request cardiology consult. If no improvement, will need cardiac pacing.  Atropine at bedside.   Discussed case with Dr. Quentin Cornwall and Dr. Jannifer Franklin.   More than 3 hours of critical care provided. Consulted at 1:32AM today.   LOS: 1 Ariyan Sinnett 5/31/20204:54 AM

## 2018-08-25 NOTE — Progress Notes (Signed)
Patient taken to cath lab for temp pacer in ICU bed accompanied by [facility] RN and myself.

## 2018-08-25 NOTE — ED Provider Notes (Signed)
.  Central Line Date/Time: 08/25/2018 2:17 AM Performed by: Merlyn Lot, MD Authorized by: Merlyn Lot, MD   Consent:    Consent obtained:  Verbal   Consent given by:  Patient   Risks discussed:  Arterial puncture, bleeding, infection, incorrect placement and nerve damage   Alternatives discussed:  Delayed treatment, no treatment, alternative treatment and referral Universal protocol:    Patient identity confirmed:  Verbally with patient and arm band Pre-procedure details:    Hand hygiene: Hand hygiene performed prior to insertion     Sterile barrier technique: All elements of maximal sterile technique followed     Skin preparation:  2% chlorhexidine   Skin preparation agent: Skin preparation agent completely dried prior to procedure   Anesthesia (see MAR for exact dosages):    Anesthesia method:  Local infiltration   Local anesthetic:  Lidocaine 1% w/o epi Procedure details:    Location:  R femoral   Site selection rationale:  Temp dialysis catheter   Patient position:  Flat   Procedural supplies:  Triple lumen   Catheter size: 28f.   Landmarks identified: yes     Ultrasound guidance: yes     Sterile ultrasound techniques: Sterile gel and sterile probe covers were used     Number of attempts:  1   Successful placement: yes   Post-procedure details:    Post-procedure:  Dressing applied and line sutured   Assessment:  Blood return through all ports and free fluid flow   Patient tolerance of procedure:  Tolerated well, no immediate complications      Merlyn Lot, MD 08/25/18 [phone]

## 2018-08-25 NOTE — ED Notes (Signed)
ED TO INPATIENT HANDOFF REPORT  ED Nurse Name and Phone #: Daiva Nakayama, RN [phone]  S Name/Age/Gender Edward Pitts 58 y.o. male Room/Bed: ED17A/ED17A  Code Status   Code Status: Prior  Home/SNF/Other Home Patient oriented to: self, place, time and situation Is this baseline? Yes   Triage Complete: Triage complete  Chief Complaint shortness of breath  Triage Note Pt coems EMS after calling out with SOB and nausea dizziness. Pt was in the 20s HR with EMS and is now. 1mg  atropine given with EMS with no change. Pt has 2 20g IVs from EMS. 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 AOx4.    Allergies Allergies  Allergen Reactions  . Ibuprofen Other (See Comments)    Affects kidneys    Level of Care/Admitting Diagnosis ED Disposition    ED Disposition Condition [facility]: [place] [[id number]]  Level of Care: ICU [6]  Covid Evaluation: Confirmed COVID Negative  Diagnosis: Complete heart block [facility]) [[id number]]  Admitting Physician: Lance Coon [[id number]]  Attending Physician: Lance Coon [phone]  Estimated length of stay: past midnight tomorrow  Certification:: I certify this patient will need inpatient services for at least 2 midnights  PT Class (Do Not Modify): Inpatient [101]  PT Acc Code (Do Not Modify): Private [1]       B Medical/Surgery History Past Medical History:  Diagnosis Date  . Anxiety   . Hyperlipidemia   . Hypertension   . Seizures ([place])    Past Surgical History:  Procedure Laterality Date  . KNEE SURGERY Left   . LEFT HEART CATH AND CORONARY ANGIOGRAPHY Left 06/29/2017   Procedure: LEFT HEART CATH AND CORONARY ANGIOGRAPHY;  Surgeon: [name], MD;  Location: [place] [facility];  Service: Cardiovascular;  Laterality: Left;     A IV Location/Drains/Wounds Patient Lines/Drains/Airways Status   Active Line/Drains/Airways    Name:   Placement date:   Placement time:   Site:   Days:   Peripheral IV 08/24/18  Right Antecubital   08/24/18    2037    Antecubital   1   Peripheral IV 08/24/18 Left Antecubital   08/24/18    2038    Antecubital   1   CVC Triple Lumen 08/25/18   08/25/18    0331     less than 1          Intake/Output Last 24 hours No intake or output data in the 24 hours ending 08/25/18 0404  Labs/Imaging Results for orders placed or performed during the hospital encounter of 08/24/18 (from the past 48 hour(s))  Basic metabolic panel     Status: Abnormal   Collection Time: 08/24/18  8:42 PM  Result Value Ref Range   Sodium 135 135 - 145 mmol/L   Potassium 7.3 (HH) 3.5 - 5.1 mmol/L    Comment: CRITICAL RESULT CALLED TO, READ BACK BY AND VERIFIED WITH JESSICA COLTRANE @2142  ON 08/24/2018 BY FMW    Chloride 104 98 - 111 mmol/L   CO2 18 (L) 22 - 32 mmol/L   Glucose, Bld 158 (H) 70 - 99 mg/dL   BUN 58 (H) 6 - 20 mg/dL   Creatinine, Ser 3.40 (H) 0.61 - 1.24 mg/dL   Calcium 8.7 (L) 8.9 - 10.3 mg/dL   GFR calc non Af Amer 19 (L) >60 mL/min   GFR calc Af Amer 22 (L) >60 mL/min   Anion gap 13 5 - 15    Comment: Performed  at [facility], [address] [place], [place] [address]  Troponin I - Once     Status: None   Collection Time: 08/24/18  8:42 PM  Result Value Ref Range   Troponin I <0.03 <0.03 ng/mL    Comment: Performed at [facility], [address] [place], [place] [address]  CBC with Differential     Status: Abnormal   Collection Time: 08/24/18  8:42 PM  Result Value Ref Range   WBC 17.5 (H) 4.0 - 10.5 K/uL   RBC 4.08 (L) 4.22 - 5.81 MIL/uL   Hemoglobin 12.0 (L) 13.0 - 17.0 g/dL   HCT 36.8 (L) 39.0 - 52.0 %   MCV 90.2 80.0 - 100.0 fL   MCH 29.4 26.0 - 34.0 pg   MCHC 32.6 30.0 - 36.0 g/dL   RDW 13.6 11.5 - 15.5 %   Platelets 381 150 - 400 K/uL   nRBC 0.0 0.0 - 0.2 %   Neutrophils Relative % 77 %   Neutro Abs 13.3 (H) 1.7 - 7.7 K/uL   Lymphocytes Relative 13 %   Lymphs Abs 2.4 0.7 - 4.0 K/uL   Monocytes Relative 9 %   Monocytes Absolute  1.7 (H) 0.1 - 1.0 K/uL   Eosinophils Relative 0 %   Eosinophils Absolute 0.0 0.0 - 0.5 K/uL   Basophils Relative 0 %   Basophils Absolute 0.0 0.0 - 0.1 K/uL   Immature Granulocytes 1 %   Abs Immature Granulocytes 0.16 (H) 0.00 - 0.07 K/uL    Comment: Performed at [facility], [address] [place], [place] [address]  Lactic acid, plasma     Status: Abnormal   Collection Time: 08/24/18  8:42 PM  Result Value Ref Range   Lactic Acid, Venous 3.8 (HH) 0.5 - 1.9 mmol/L    Comment: CRITICAL RESULT CALLED TO, READ BACK BY AND VERIFIED WITH JESSICA COLTRANE @2204  08/24/2018 BY FMW Performed at [facility], [address] [place], [place] [address]   Magnesium     Status: None   Collection Time: 08/24/18  8:42 PM  Result Value Ref Range   Magnesium 2.0 1.7 - 2.4 mg/dL    Comment: Performed at [facility], [address] [place], [place] [address]  Phosphorus     Status: Abnormal   Collection Time: 08/24/18  8:42 PM  Result Value Ref Range   Phosphorus 4.8 (H) 2.5 - 4.6 mg/dL    Comment: Performed at [facility], [address] [place], [place] [address]  TSH     Status: Abnormal   Collection Time: 08/24/18  8:42 PM  Result Value Ref Range   TSH 7.778 (H) 0.350 - 4.500 uIU/mL    Comment: Performed by a 3rd Generation assay with a functional sensitivity of <=0.01 uIU/mL. Performed at [facility], [address] [place], [place] [address]   SARS Coronavirus 2 (CEPHEID - Performed in [facility] hospital lab), Hosp Order     Status: None   Collection Time: 08/24/18  8:42 PM  Result Value Ref Range   SARS Coronavirus 2 NEGATIVE NEGATIVE    Comment: (NOTE) If result is NEGATIVE SARS-CoV-2 target nucleic acids are NOT DETECTED. The SARS-CoV-2 RNA is generally detectable in upper and lower  respiratory specimens during the acute phase of infection. The lowest  concentration of SARS-CoV-2 viral copies this assay can detect is 250   copies / mL. A negative result does not preclude SARS-CoV-2 infection  and should  not be used as the sole basis for treatment or other  patient management decisions.  A negative result may occur with  improper specimen collection / handling, submission of specimen other  than nasopharyngeal swab, presence of viral mutation(s) within the  areas targeted by this assay, and inadequate number of viral copies  (<250 copies / mL). A negative result must be combined with clinical  observations, patient history, and epidemiological information. If result is POSITIVE SARS-CoV-2 target nucleic acids are DETECTED. The SARS-CoV-2 RNA is generally detectable in upper and lower  respiratory specimens dur ing the acute phase of infection.  Positive  results are indicative of active infection with SARS-CoV-2.  Clinical  correlation with patient history and other diagnostic information is  necessary to determine patient infection status.  Positive results do  not rule out bacterial infection or co-infection with other viruses. If result is PRESUMPTIVE POSTIVE SARS-CoV-2 nucleic acids MAY BE PRESENT.   A presumptive positive result was obtained on the submitted specimen  and confirmed on repeat testing.  While 2019 novel coronavirus  (SARS-CoV-2) nucleic acids may be present in the submitted sample  additional confirmatory testing may be necessary for epidemiological  and / or clinical management purposes  to differentiate between  SARS-CoV-2 and other Sarbecovirus currently known to infect humans.  If clinically indicated additional testing with an alternate test  methodology [phone]) is advised. The SARS-CoV-2 RNA is generally  detectable in upper and lower respiratory sp ecimens during the acute  phase of infection. The expected result is Negative. Fact Sheet for Patients:  StrictlyIdeas.no Fact Sheet for Healthcare  Providers: BankingDealers.co.za This test is not yet approved or cleared by the [place] FDA and has been authorized for detection and/or diagnosis of SARS-CoV-2 by FDA under an Emergency Use Authorization (EUA).  This EUA will remain in effect (meaning this test can be used) for the duration of the COVID-19 declaration under Section 564(b)(1) of the Act, 21 U.S.C. section 360bbb-3(b)(1), unless the authorization is terminated or revoked sooner. Performed at [facility], [address], [place] [address]   Procalcitonin - Baseline     Status: None   Collection Time: 08/24/18  8:42 PM  Result Value Ref Range   Procalcitonin 0.25 ng/mL    Comment:        Interpretation: PCT (Procalcitonin) <= 0.5 ng/mL: Systemic infection (sepsis) is not likely. Local bacterial infection is possible. (NOTE)       Sepsis PCT Algorithm           Lower Respiratory Tract                                      Infection PCT Algorithm    ----------------------------     ----------------------------         PCT < 0.25 ng/mL                PCT < 0.10 ng/mL         Strongly encourage             Strongly discourage   discontinuation of antibiotics    initiation of antibiotics    ----------------------------     -----------------------------       PCT 0.25 - 0.50 ng/mL            PCT 0.10 - 0.25 ng/mL  OR       >80% decrease in PCT            Discourage initiation of                                            antibiotics      Encourage discontinuation           of antibiotics    ----------------------------     -----------------------------         PCT >= 0.50 ng/mL              PCT 0.26 - 0.50 ng/mL               AND        <80% decrease in PCT             Encourage initiation of                                             antibiotics       Encourage continuation           of antibiotics    ----------------------------      -----------------------------        PCT >= 0.50 ng/mL                  PCT > 0.50 ng/mL               AND         increase in PCT                  Strongly encourage                                      initiation of antibiotics    Strongly encourage escalation           of antibiotics                                     -----------------------------                                           PCT <= 0.25 ng/mL                                                 OR                                        > 80% decrease in PCT                                     Discontinue / Do not initiate  antibiotics Performed at [facility], [place], [place] [address]   Lactic acid, plasma     Status: Abnormal   Collection Time: 08/24/18 10:58 PM  Result Value Ref Range   Lactic Acid, Venous 4.4 (HH) 0.5 - 1.9 mmol/L    Comment: CRITICAL RESULT CALLED TO, READ BACK BY AND VERIFIED WITH BUTCH WOODS ON 08/24/18 AT 2339 [facility] Performed at [facility], [address] [place], [place] [address]   Potassium     Status: Abnormal   Collection Time: 08/25/18 12:16 AM  Result Value Ref Range   Potassium 6.6 (HH) 3.5 - 5.1 mmol/L    Comment: CRITICAL RESULT CALLED TO, READ BACK BY AND VERIFIED WITH BUTCH WOODS ON 08/25/18 AT 0044 [facility] Performed at [facility], [address], [place], [place] [address]    No results found.  Pending Labs [employer] (From admission, onward)    Start     Ordered   08/24/18 2247  Urinalysis, Complete w Microscopic  ONCE - STAT,   STAT     08/24/18 2246   Signed and Held  HIV antibody (Routine Testing)  Once,   R     Signed and Held   Signed and Held  CBC  (heparin)  Once,   R    Comments:  Baseline for heparin therapy IF NOT ALREADY DRAWN.  Notify MD if PLT < 100 K.    Signed and Held   Signed and Held  Creatinine, serum  (heparin)  Once,   R    Comments:  Baseline for heparin  therapy IF NOT ALREADY DRAWN.    Signed and Held   Signed and Held  Basic metabolic panel  Tomorrow morning,   R     Signed and Held   Signed and Held  CBC  Tomorrow morning,   R     Signed and Held   Signed and Held  Renal function panel  Once,   R     Signed and Held   Signed and Held  Parathyroid hormone, intact (no Ca)  Once,   R     Signed and Held   Signed and Held  Hepatitis B surface antigen  ([facility] (Hepatitis B))  Once,   R     Signed and Held   Signed and Held  Hepatitis B surface antibody  ([facility] (Hepatitis B))  Once,   R     Signed and Held   Signed and Held  Hepatitis B core antibody, total  ([facility] (Hepatitis B))  Once,   R     Signed and Held   Signed and Held  Hepatitis C antibody  ([facility] (Hepatitis B))  Once,   R     Signed and Held          Vitals/Pain Today's Vitals   08/25/18 0315 08/25/18 0330 08/25/18 0345 08/25/18 0400  BP:  (!) 133/123 (!) 141/131 (!) 123/105  Pulse: (!) 19 (!) 19 (!) 19 (!) 19  Resp: 17 (!) 26 18 14   Temp:  (!) 97.3 F (36.3 C)    TempSrc:  Oral    SpO2:  100% 100%   Weight:      Height:      PainSc:  0-No pain      Isolation Precautions No active isolations  Medications Medications  sodium bicarbonate 150 mEq in dextrose 5 % 1,000 mL infusion (  Intravenous New Bag/Given 08/24/18 2246)  0.9 %  sodium chloride infusion ( Intravenous New Bag/Given 08/25/18 0031)  Chlorhexidine Gluconate Cloth 2 % PADS 6 each (has no administration in time range)  tuberculin injection 5 Units (5 Units Intradermal Given 08/25/18 0316)  norepinephrine (LEVOPHED) 4mg  in 243mL premix infusion (5 mcg/min Intravenous New Bag/Given 08/25/18 0332)  ondansetron (ZOFRAN) injection 4 mg (4 mg Intravenous Given 08/24/18 2043)  calcium gluconate inj 10% (1 g) URGENT USE ONLY! (1 g Intravenous Given 08/24/18 2154)  albuterol (PROVENTIL) (2.5 MG/3ML) 0.083% nebulizer solution 10 mg (10 mg Nebulization  Given 08/24/18 2244)  sodium bicarbonate injection 50 mEq (50 mEq Intravenous Given 08/24/18 2153)  insulin aspart (novoLOG) injection 10 Units (10 Units Intravenous Given 08/24/18 2154)    And  dextrose 50 % solution 50 mL (50 mLs Intravenous Given 08/24/18 2154)  calcium gluconate inj 10% (1 g) URGENT USE ONLY! (1 g Intravenous Given 08/24/18 2213)  calcium gluconate inj 10% (1 g) URGENT USE ONLY! (1 g Intravenous Given 08/24/18 2213)  ondansetron (ZOFRAN) injection 4 mg (4 mg Intravenous Given 08/24/18 2211)  patiromer Daryll Drown) packet 25.2 g (25.2 g Oral Given 08/24/18 2249)  sodium bicarbonate injection 50 mEq (50 mEq Intravenous Given 08/24/18 2252)  dextrose 50 % solution 50 mL (50 mLs Intravenous Given 08/24/18 2252)  sodium chloride 0.9 % bolus 250 mL (250 mLs Intravenous Bolus 08/24/18 2300)  calcium gluconate inj 10% (1 g) URGENT USE ONLY! (2 g Intravenous Given 08/25/18 0035)  atropine 1 MG/10ML injection 1 mg (1 mg Intravenous Given 08/25/18 0104)    Mobility walks     Focused Assessments    R Recommendations: See Admitting Provider Note  Report given to:   Additional Notes: None

## 2018-08-25 NOTE — H&P (View-Only) (Signed)
Cardiology Consultation Note    Patient ID: Edward Pitts, MRN: [id number], DOB/AGE: Sep 18, 1960 58 y.o. Admit date: 08/24/2018   Date of Consult: 08/25/2018 Primary Physician: Petra Kuba, MD Primary Cardiologist: Dr. Nehemiah Massed  Chief Complaint: hyperkalemia Reason for Consultation: bradycardia Requesting MD: Dr. Margaretmary Eddy  HPI: Edward Pitts is a 58 y.o. male with history of hyperlipidemia-treated with atorvastatin, hypertension-treated with metoprolol and lisinopril, admitted with acute kidney injury and profound hyperkalemia. Consult called for bradycardia. Initial ekg showed chb with ventricular escape at 19. K was 7.3 at the time. BUN/ Cr was 58 and 3.4. Baseline creatinine was 1.65. Patient had cardiac cath done recently showing no significant obstructive cad. He had reported dizziness and syncope intermitantly over the past few weeks. Has EF of 45 % by echo 1 year ago. Holter done in  March of 2020 showed nsr, asymptomatic bradycardia with no reported heart block. At that time he was medically treated. He states he has been eating and drinking normally.  Received emergent dialysis yesterday.  Potassium has improved.  Currently in sinus bradycardia with rates of 38-41 with transient high-grade heart block.  Patient denies noncompliance with medicine.  He has had no diarrhea or nausea until day of admission.  Past Medical History:  Diagnosis Date  . Anxiety   . Hyperlipidemia   . Hypertension   . Seizures [facility])       Surgical History:  Past Surgical History:  Procedure Laterality Date  . KNEE SURGERY Left   . LEFT HEART CATH AND CORONARY ANGIOGRAPHY Left 06/29/2017   Procedure: LEFT HEART CATH AND CORONARY ANGIOGRAPHY;  Surgeon: [name], MD;  Location: [place] [facility];  Service: Cardiovascular;  Laterality: Left;     Home Meds: Prior to Admission medications   Medication Sig Start Date End Date Taking? Authorizing Provider  allopurinol (ZYLOPRIM) 300 MG tablet  Take 300 mg by mouth daily. 06/12/18  Yes [provider]  atorvastatin (LIPITOR) 20 MG tablet Take 20 mg by mouth daily. 07/15/18  Yes [provider]  clonazePAM (KLONOPIN) 0.5 MG tablet 1 tablet in the morning, 2 in the evening 06/27/18  Yes Kathrynn Ducking, MD  hydrocortisone 2.5 % cream Apply 1 application topically daily as needed (for itching).    Yes [provider]  ketoconazole (NIZORAL) 2 % cream Apply 1 application topically daily as needed for irritation.    Yes [provider]  lisinopril (ZESTRIL) 20 MG tablet Take 20 mg by mouth daily.   Yes [provider]  metoprolol succinate (TOPROL-XL) 25 MG 24 hr tablet Take 25 mg by mouth daily.   Yes [provider]  Multiple Vitamins-Minerals (CENTRUM SILVER PO) Take 1 tablet by mouth daily.   Yes [provider]    Inpatient Medications:  . Chlorhexidine Gluconate Cloth  6 each Topical Q0600  . heparin  5,000 Units Subcutaneous Q8H  . [START ON 08/26/2018] pneumococcal 23 valent vaccine  0.5 mL Intramuscular Tomorrow-1000  . tuberculin  5 Units Intradermal Once   . sodium chloride Stopped (08/25/18 0427)  . norepinephrine (LEVOPHED) Adult infusion 4 mcg/min (08/25/18 0840)  .  sodium bicarbonate  infusion 1000 mL 168 mL/hr at 08/24/18 2246    Allergies:  Allergies  Allergen Reactions  . Ibuprofen Other (See Comments)    Affects kidneys    Social History   Socioeconomic History  . Marital status: Married    Spouse name: Not on file  . Number of children: Not on  file  . Years of education: 45  . Highest education level: Not on file  Occupational History  . Occupation: [employer]   Social Needs  . Financial resource strain: Not on file  . Food insecurity:    Worry: Not on file    Inability: Not on file  . Transportation needs:    Medical: Not on file    Non-medical: Not on file  Tobacco Use  . Smoking status: Never Smoker  . Smokeless tobacco: Never Used   Substance and Sexual Activity  . Alcohol use: No  . Drug use: No  . Sexual activity: Not on file  Lifestyle  . Physical activity:    Days per week: Not on file    Minutes per session: Not on file  . Stress: Not on file  Relationships  . Social connections:    Talks on phone: Not on file    Gets together: Not on file    Attends religious service: Not on file    Active member of club or organization: Not on file    Attends meetings of clubs or organizations: Not on file    Relationship status: Not on file  . Intimate partner violence:    Fear of current or ex partner: Not on file    Emotionally abused: Not on file    Physically abused: Not on file    Forced sexual activity: Not on file  Other Topics Concern  . Not on file  Social History Narrative   Lives with mother, wife and sister   Caffeine use: sodas (2 per day)     History reviewed. No pertinent family history.   Review of Systems: A 12-system review of systems was performed and is negative except as noted in the HPI.  Labs: Recent Labs    08/24/18 2042  TROPONINI <0.03   Lab Results  Component Value Date   WBC 17.5 (H) 08/24/2018   HGB 12.0 (L) 08/24/2018   HCT 36.8 (L) 08/24/2018   MCV 90.2 08/24/2018   PLT 381 08/24/2018    Recent Labs  Lab 2018/09/15 0436  NA 140  K 4.3  CL 101  CO2 29  BUN 37*  CREATININE 2.46*  CALCIUM 9.1  GLUCOSE 127*   No results found for: CHOL, HDL, LDLCALC, TRIG No results found for: DDIMER  Radiology/Studies:  US Renal  Result Date: 09/15/2018 CLINICAL DATA:  Acute kidney failure EXAM: RENAL / URINARY TRACT ULTRASOUND COMPLETE COMPARISON:  None. FINDINGS: Right Kidney: Renal measurements: 10.9 x 5.3 x 5.2 cm = volume: 155.5 mL. 1.3 x 1.4 x 1.5 cm lower pole simple cyst. No hydronephrosis. Left Kidney: Renal measurements: 11.6 x 5.6 x 6.4 cm = volume: 216.2 mL. No mass or hydronephrosis. Bladder: Appears normal for degree of bladder distention. IMPRESSION: No  hydronephrosis. 1.5 cm right lower pole simple cyst, benign. Electronically Signed   By: Julian Hy M.D.   On: 09/15/2018 07:32   Dg Knee Complete 4 Views Right  Result Date: 08/10/2018 CLINICAL DATA:  Acute onset of RIGHT knee pain that began last night. No known injuries, though the patient describes crepitus with a popping sensation approximately 1 week ago. Patient unable to fully extend the knee. EXAM: RIGHT KNEE - COMPLETE 4+ VIEW COMPARISON:  None. FINDINGS: No evidence of acute, subacute or healed fractures. Calcified loose body in the superior joint space. Small joint effusion. Joint spaces well-preserved. Enthesopathic spur is at the insertion of the quadriceps tendon on the superior patella and  the patellar tendon on the inferior patella. IMPRESSION: 1. No acute or subacute osseous abnormality. 2. Calcified loose body in the superior joint space. 3. Small joint effusion. Electronically Signed   By: Evangeline Dakin M.D.   On: 08/10/2018 21:51   Vas US Carotid  Result Date: 08/01/2018 Carotid Arterial Duplex Study Indications: Syncope. Performing Technologist: June Leap RDMS, RVT  Examination Guidelines: A complete evaluation includes B-mode imaging, spectral Doppler, color Doppler, and power Doppler as needed of all accessible portions of each vessel. Bilateral testing is considered an integral part of a complete examination. Limited examinations for reoccurring indications may be performed as noted.  Right Carotid Findings: +----------+--------+--------+--------+--------+------------------+           PSV cm/sEDV cm/sStenosisDescribeComments           +----------+--------+--------+--------+--------+------------------+ CCA Prox  160     25                      intimal thickening +----------+--------+--------+--------+--------+------------------+ CCA Distal165     24                                          +----------+--------+--------+--------+--------+------------------+ ICA Prox  125     33                      intimal thickening +----------+--------+--------+--------+--------+------------------+ ICA Distal126     39                                         +----------+--------+--------+--------+--------+------------------+ ECA       114     14                                         +----------+--------+--------+--------+--------+------------------+ +----------+--------+-------+----------------+-------------------+           PSV cm/sEDV cmsDescribe        Arm Pressure (mmHG) +----------+--------+-------+----------------+-------------------+ [id number]            Multiphasic, WNL                    +----------+--------+-------+----------------+-------------------+ +---------+--------+--+--------+--+---------+ VertebralPSV cm/s51EDV cm/s13Antegrade +---------+--------+--+--------+--+---------+  Left Carotid Findings: +----------+--------+--------+--------+--------+------------------+           PSV cm/sEDV cm/sStenosisDescribeComments           +----------+--------+--------+--------+--------+------------------+ CCA Prox  171     34                      intimal thickening +----------+--------+--------+--------+--------+------------------+ CCA Distal177     30                                         +----------+--------+--------+--------+--------+------------------+ ICA Prox  153     30                                         +----------+--------+--------+--------+--------+------------------+ ICA Distal92      30                                         +----------+--------+--------+--------+--------+------------------+  ECA       191     23                                         +----------+--------+--------+--------+--------+------------------+ +----------+--------+--------+----------------+-------------------+ SubclavianPSV cm/sEDV  cm/sDescribe        Arm Pressure (mmHG) +----------+--------+--------+----------------+-------------------+           168             Multiphasic, WNL                    +----------+--------+--------+----------------+-------------------+ +---------+--------+--+--------+--+---------+ VertebralPSV cm/s53EDV cm/s10Antegrade +---------+--------+--+--------+--+---------+  Summary: Right Carotid: The extracranial vessels were near-normal with only minimal wall                thickening or plaque. Left Carotid: The extracranial vessels were near-normal with only minimal wall               thickening or plaque.  *See table(s) above for measurements and observations.  Electronically signed by Servando Snare MD on 08/01/2018 at 4:25:57 PM.    Final     Wt Readings from Last 3 Encounters:  08/25/18 104.9 kg  08/10/18 102.1 kg  05/21/18 104.9 kg    EKG: Sinus bradycardia with first-degree AV block with intermittent third-degree AV block.  Physical Exam:  Blood pressure (!) 131/53, pulse (!) 43, temperature 98.3 F (36.8 C), temperature source Oral, resp. rate (!) 27, height 5\' 7"  (1.702 m), weight 104.9 kg, SpO2 93 %. Body mass index is 36.22 kg/m. General: Well developed, well nourished, in no acute distress. Head: Normocephalic, atraumatic, sclera non-icteric, no xanthomas, nares are without discharge.  Neck: Negative for carotid bruits. JVD not elevated. Lungs: Clear bilaterally to auscultation without wheezes, rales, or rhonchi. Breathing is unlabored. Heart: RRR with S1 S2. No murmurs, rubs, or gallops appreciated. Abdomen: Soft, non-tender, non-distended with normoactive bowel sounds. No hepatomegaly. No rebound/guarding. No obvious abdominal masses. Msk:  Strength and tone appear normal for age. Extremities: No clubbing or cyanosis. No edema.  Distal pedal pulses are 2+ and equal bilaterally. Neuro: Alert and oriented X 3. No facial asymmetry. No focal deficit. Moves all extremities  spontaneously. Psych:  Responds to questions appropriately with a normal affect.     Assessment and Plan  58 year old male with history of syncope in the past now admitted with acute on chronic renal insufficiency.  His baseline creatinine is 1.5-1.6.  Was markedly elevated at 3.4 on admission with potassium of 7.3.  Had complete heart block on admission.  After hemodialysis and improvement of his serum potassium, he has resumed for the most part AV conduction although still is bradycardic.  Has been on metoprolol as an outpatient.  Would continue to hold the metoprolol.  Follow serum potassium.  As cellular potassium normalizes, will follow heart rate.  If he remains symptomatically bradycardic after resolution of profound hyperkalemia, consideration for permanent pacemaker.  Does not appear to require temporary pacemaker at present.  Signed, Teodoro Spray MD 08/25/2018, 9:41 AM Pager: [phone]

## 2018-08-25 NOTE — Progress Notes (Signed)
Pastoral Care Visit    08/25/18 0930  Clinical Encounter Type  Visited With Patient;Health care provider  Visit Type Follow-up;Other (Comment) (HCPOA)  Referral From Nurse  Consult/Referral To Chaplain  Stress Factors  Patient Stress Factors Health changes   Pt visit due to rqst for HCPOA.  Pt was reclining in bed, alert and oriented. Pt shared about events bringing him to hosp.  Due to no notary on wknd, chap briefly explained HCPOA and indicated chap will return Mon morning to complete. Left ppwk w/ pt for him to read over.  Pt indicated that he wishes for his son to have Power of Attorney due to wife not being fluent in [place].  Pt also indicated that he wishes to be full code.  Darcey Nora, Chaplain

## 2018-08-25 NOTE — Progress Notes (Signed)
This note also relates to the following rows which could not be included: Resp - Cannot attach notes to unvalidated device data BP - Cannot attach notes to unvalidated device data  Butch RN states that thye have not been able to obtain blood pressure. Heart rate 19-20 heart block. Dr Juleen China notified. Orders to proceed with low bfr. BFR currently set at 114ml min. Levophed ordered.

## 2018-08-25 NOTE — Progress Notes (Signed)
Pharmacy Antibiotic Note  Edward Pitts is a 58 y.o. male admitted on 08/24/2018 with SOB.  Pharmacy has been consulted for Unasyn dosing for aspiration pneumonia.  Plan: Unasyn 3 g IV q6h  CrCl is on the cutoff for reduced dosing. Will monitor renal function and adjust dose as necessary. Patient had emergent HD session for hyperkalemia but is not currently on HD.  Height: 5\' 7"  (170.2 cm) Weight: 231 lb 4.2 oz (104.9 kg) IBW/kg (Calculated) : 66.1  Temp (24hrs), Avg:97.8 F (36.6 C), Min:97.3 F (36.3 C), Max:98.3 F (36.8 C)  Recent Labs  Lab 08/24/18 2042 08/24/18 2258 08/25/18 0436 08/25/18 0936 08/25/18 1225  WBC 17.5*  --   --  14.4*  --   CREATININE 3.40*  --  2.46* 2.94*  --   LATICACIDVEN 3.8* 4.4*  --  2.1* 2.6*    Estimated Creatinine Clearance: 31.2 mL/min (A) (by C-G formula based on SCr of 2.94 mg/dL (H)).    Allergies  Allergen Reactions  . Ibuprofen Other (See Comments)    Affects kidneys    Antimicrobials this admission: Unasyn 5/31 >>  Dose adjustments this admission:   Microbiology results: 5/31 BCx: pending 5/31 MRSA PCR: negative  Thank you for allowing pharmacy to be a part of this patient's care.  Tawnya Crook, PharmD Pharmacy Resident  08/25/2018 4:02 PM

## 2018-08-25 NOTE — Progress Notes (Signed)
[place] [facility] [place] NAME: Edward Pitts    MR#:  [id number]  DATE OF BIRTH:  [birth year]  SUBJECTIVE:  CHIEF COMPLAINT: Patient feels better.  Dizziness is better than yesterday.  Resting comfortably during my examination.  Had hemodialysis in a.m.  REVIEW OF SYSTEMS:  CONSTITUTIONAL: No fever, fatigue or weakness.  EYES: No blurred or double vision.  EARS, NOSE, AND THROAT: No tinnitus or ear pain.  RESPIRATORY: No cough, shortness of breath, wheezing or hemoptysis.  CARDIOVASCULAR: No chest pain, orthopnea, edema.  GASTROINTESTINAL: No nausea, vomiting, diarrhea or abdominal pain.  GENITOURINARY: No dysuria, hematuria.  ENDOCRINE: No polyuria, nocturia,  HEMATOLOGY: No anemia, easy bruising or bleeding SKIN: No rash or lesion. MUSCULOSKELETAL: No joint pain or arthritis.   NEUROLOGIC: No tingling, numbness, weakness.  PSYCHIATRY: No anxiety or depression.   DRUG ALLERGIES:   Allergies  Allergen Reactions  . Ibuprofen Other (See Comments)    Affects kidneys    VITALS:  Blood pressure (!) 143/33, pulse (!) 33, temperature 98.3 F (36.8 C), temperature source Oral, resp. rate (!) 21, height 5\' 7"  (1.702 m), weight 104.9 kg, SpO2 96 %.  PHYSICAL EXAMINATION:  GENERAL:  58 y.o.-year-old patient lying in the bed with no acute distress.  EYES: Pupils equal, round, reactive to light and accommodation. No scleral icterus. Extraocular muscles intact.  HEENT: Head atraumatic, normocephalic. Oropharynx and nasopharynx clear.  NECK:  Supple, no jugular venous distention. No thyroid enlargement, no tenderness.  LUNGS: Normal breath sounds bilaterally, no wheezing, rales,rhonchi or crepitation. No use of accessory muscles of respiration.  CARDIOVASCULAR: S1, S2 normal, bradycardic no murmurs, rubs, or gallops.  ABDOMEN: Soft, nontender, nondistended. Bowel sounds present. No organomegaly or mass.  EXTREMITIES: No pedal edema, cyanosis,  or clubbing.  NEUROLOGIC: Cranial nerves II through XII are intact. Muscle strength 5/5 in all extremities. Sensation intact. Gait not checked.  PSYCHIATRIC: The patient is alert and oriented x 3.  SKIN: No obvious rash, lesion, or ulcer.    LABORATORY PANEL:   CBC Recent Labs  Lab 08/25/18 0936  WBC 14.4*  HGB 10.9*  HCT 32.2*  PLT 230   ------------------------------------------------------------------------------------------------------------------  Chemistries  Recent Labs  Lab 08/25/18 0936  NA 140  K 4.6  CL 102  CO2 27  GLUCOSE 120*  BUN 46*  CREATININE 2.94*  CALCIUM 9.6  MG 1.8  AST 29  ALT 51*  ALKPHOS 70  BILITOT 0.9   ------------------------------------------------------------------------------------------------------------------  Cardiac Enzymes Recent Labs  Lab 08/24/18 2042  TROPONINI <0.03   ------------------------------------------------------------------------------------------------------------------  RADIOLOGY:  US Renal  Result Date: 08/25/2018 CLINICAL DATA:  Acute kidney failure EXAM: RENAL / URINARY TRACT ULTRASOUND COMPLETE COMPARISON:  None. FINDINGS: Right Kidney: Renal measurements: 10.9 x 5.3 x 5.2 cm = volume: 155.5 mL. 1.3 x 1.4 x 1.5 cm lower pole simple cyst. No hydronephrosis. Left Kidney: Renal measurements: 11.6 x 5.6 x 6.4 cm = volume: 216.2 mL. No mass or hydronephrosis. Bladder: Appears normal for degree of bladder distention. IMPRESSION: No hydronephrosis. 1.5 cm right lower pole simple cyst, benign. Electronically Signed   By: Julian Hy M.D.   On: 08/25/2018 07:32   Dg Chest Port 1 View  Result Date: 08/25/2018 CLINICAL DATA:  Onset shortness of breath today. EXAM: PORTABLE CHEST 1 VIEW COMPARISON:  None. FINDINGS: Lungs clear. Heart size normal. No pneumothorax or pleural effusion. No acute bony abnormality. Defibrillator pad in place. Paced. IMPRESSION: No acute disease. Electronically Signed  By: Inge Rise M.D.   On: 08/25/2018 11:08    EKG:   Orders placed or performed during the hospital encounter of 08/24/18  . ED EKG  . ED EKG  . EKG 12-Lead  . EKG 12-Lead    ASSESSMENT AND PLAN:   #Acute hyperkalemia Potassium 7.3 at the time of admission Status post insulin with dextrose, bicarb drip and large dose of [place] Patient had urgent hemodialysis done in a.m.  Nephrology is following  #Severe bradycardia with third-degree heart block and 2 episodes of syncope Hold metoprolol Cardiology is not considering pacemaker at this time as patient's potassium is normalizing.  Continue close monitoring of the heart rate on telemetry Appreciate Dr. Ubaldo Glassing recommendations  #Acute kidney injury on chronic kidney disease stage III with hyperkalemia and metabolic acidosis Baseline creatinine 1.65.  Current creatinine 3.4 Had emergent hemodialysis Continue bicarb infusion and follow-up with nephrology  #Sepsis with severe hypotension with  Shock-probably aspiration pneumonia  requiring pressors norepinephrine Lactic acid is elevated and procalcitonin at 0.31.  Pan cultures are obtained.    IV Unasyn started for aspiration pneumonia  #Seizures-continue close monitoring Ativan as needed for breakthrough seizures     All the records are reviewed and case discussed with Care Management/Social Workerr. Management plans discussed with the patient at bedside and he is in in agreement.  CODE STATUS: fc  TOTAL TIME TAKING CARE OF THIS PATIENT: 35 minutes.   POSSIBLE D/C IN 3  DAYS, DEPENDING ON CLINICAL CONDITION.  Note: This dictation was prepared with Dragon dictation along with smaller phrase technology. Any transcriptional errors that result from this process are unintentional.   Edward Pitts M.D on 08/25/2018 at 3:35 PM  Between 7am to 6pm - Pager - [phone] After 6pm go to [email or web address] - password [email or web address] [facility]  Office   [phone]  CC: Primary care physician; Petra Kuba, MD

## 2018-08-25 NOTE — Interval H&P Note (Signed)
History and Physical Interval Note:  08/25/2018 6:23 PM  Rodrigues Urbanek  has presented today for surgery, with the diagnosis of STEMI.  The various methods of treatment have been discussed with the patient and family. After consideration of risks, benefits and other options for treatment, the patient has consented to  Procedure(s): TEMPORARY PACEMAKER (N/A) as a surgical intervention.  The patient's history has been reviewed, patient examined, no change in status, stable for surgery.  I have reviewed the patient's chart and labs.  Questions were answered to the patient's satisfaction.     [name]

## 2018-08-25 NOTE — Progress Notes (Addendum)
Pharmacy Electrolyte Monitoring Consult:  Pharmacy consulted to assist in monitoring and replacing electrolytes in this 58 y.o. male admitted on 08/24/2018 with Bradycardia and Shortness of Breath   Labs:  Sodium (mmol/L)  Date Value  08/25/2018 140   Potassium (mmol/L)  Date Value  08/25/2018 4.6   Magnesium (mg/dL)  Date Value  08/25/2018 1.8   Phosphorus (mg/dL)  Date Value  08/25/2018 5.7 (H)   Calcium (mg/dL)  Date Value  08/25/2018 9.6   Albumin (g/dL)  Date Value  08/25/2018 3.6    Assessment/Plan: 1. No significant drug- drug interactions noted at this time.   2. No replacement warranted. Patient requiring emergent dialysis. Will replace conservatively. Will order electrolytes with am labs.   Pharmacy will continue to monitor and adjust per consult.    Simpson,Michael L 08/25/2018 11:49 AM

## 2018-08-25 NOTE — Progress Notes (Signed)
Family Meeting Note  Advance Directive:yes  Today a meeting took place with the Patient.   The following clinical team members were present during this meeting:MD  The following were discussed:Patient's diagnosis: Severe bradycardia hyperkalemia requiring hemodialysis, hypertension, treatment plan of care discussed in detail with the patient  Patient's progosis: Unable to determine and Goals for treatment: Full Code.  Brother Louie Casa is the healthcare POA  Additional follow-up to be provided: Hospitalist, intensivist, cardiology  Time spent during discussion:17 min  Nicholes Mango, MD

## 2018-08-26 ENCOUNTER — Encounter
Admission: EM | Disposition: A | Discharge status: Home / Self Care | Payer: Self-pay | Source: Home / Self Care | Attending: Internal Medicine

## 2018-08-26 ENCOUNTER — Inpatient Hospital Stay: Payer: BLUE CROSS/BLUE SHIELD | Admitting: Anesthesiology

## 2018-08-26 ENCOUNTER — Encounter: Payer: Self-pay | Admitting: Cardiovascular Disease

## 2018-08-26 ENCOUNTER — Inpatient Hospital Stay
Admit: 2018-08-26 | Discharge: 2018-08-26 | Disposition: A | Discharge status: Home / Self Care | Payer: BLUE CROSS/BLUE SHIELD | Attending: Internal Medicine | Admitting: Internal Medicine

## 2018-08-26 ENCOUNTER — Inpatient Hospital Stay: Payer: BLUE CROSS/BLUE SHIELD

## 2018-08-26 DIAGNOSIS — Z950 Presence of cardiac pacemaker: Secondary | ICD-10-CM

## 2018-08-26 HISTORY — PX: PACEMAKER INSERTION: SHX728

## 2018-08-26 HISTORY — DX: Presence of cardiac pacemaker: Z95.0

## 2018-08-26 LAB — BASIC METABOLIC PANEL
Anion gap: 10 (ref 5–15)
Anion gap: 8 (ref 5–15)
BUN: 35 mg/dL — ABNORMAL HIGH (ref 6–20)
BUN: 40 mg/dL — ABNORMAL HIGH (ref 6–20)
CO2: 30 mmol/L (ref 22–32)
CO2: 30 mmol/L (ref 22–32)
Calcium: 8.8 mg/dL — ABNORMAL LOW (ref 8.9–10.3)
Calcium: 9.1 mg/dL (ref 8.9–10.3)
Chloride: 102 mmol/L (ref 98–111)
Chloride: 99 mmol/L (ref 98–111)
Creatinine, Ser: 1.89 mg/dL — ABNORMAL HIGH (ref 0.61–1.24)
Creatinine, Ser: 2.16 mg/dL — ABNORMAL HIGH (ref 0.61–1.24)
GFR calc Af Amer: 37 mL/min — ABNORMAL LOW (ref >60)
GFR calc Af Amer: 44 mL/min — ABNORMAL LOW (ref >60)
GFR calc non Af Amer: 32 mL/min — ABNORMAL LOW (ref >60)
GFR calc non Af Amer: 38 mL/min — ABNORMAL LOW (ref >60)
Glucose, Bld: 130 mg/dL — ABNORMAL HIGH (ref 70–99)
Glucose, Bld: 132 mg/dL — ABNORMAL HIGH (ref 70–99)
Potassium: 4.3 mmol/L (ref 3.5–5.1)
Potassium: 4.5 mmol/L (ref 3.5–5.1)
Sodium: 139 mmol/L (ref 135–145)
Sodium: 140 mmol/L (ref 135–145)

## 2018-08-26 LAB — MAGNESIUM
Magnesium: 1.6 mg/dL — ABNORMAL LOW (ref 1.7–2.4)
Magnesium: 1.8 mg/dL (ref 1.7–2.4)

## 2018-08-26 LAB — PHOSPHORUS
Phosphorus: 4 mg/dL (ref 2.5–4.6)
Phosphorus: 5 mg/dL — ABNORMAL HIGH (ref 2.5–4.6)

## 2018-08-26 LAB — CBC
HCT: 35 % — ABNORMAL LOW (ref 39.0–52.0)
Hemoglobin: 11.3 g/dL — ABNORMAL LOW (ref 13.0–17.0)
MCH: 29.4 pg (ref 26.0–34.0)
MCHC: 32.3 g/dL (ref 30.0–36.0)
MCV: 90.9 fL (ref 80.0–100.0)
Platelets: 174 10*3/uL (ref 150–400)
RBC: 3.85 MIL/uL — ABNORMAL LOW (ref 4.22–5.81)
RDW: 13.9 % (ref 11.5–15.5)
WBC: 9.1 10*3/uL (ref 4.0–10.5)
nRBC: 0 % (ref 0.0–0.2)

## 2018-08-26 LAB — ECHOCARDIOGRAM COMPLETE
Height: 67 in
Weight: 3700.2 oz

## 2018-08-26 LAB — TROPONIN I: Troponin I: 0.05 ng/mL (ref <0.03)

## 2018-08-26 LAB — LACTIC ACID, PLASMA: Lactic Acid, Venous: 1.4 mmol/L (ref 0.5–1.9)

## 2018-08-26 LAB — GLUCOSE, CAPILLARY: Glucose-Capillary: 117 mg/dL — ABNORMAL HIGH (ref 70–99)

## 2018-08-26 LAB — PROCALCITONIN: Procalcitonin: 0.17 ng/mL

## 2018-08-26 SURGERY — INSERTION, CARDIAC PACEMAKER
Anesthesia: Choice

## 2018-08-26 SURGERY — INSERTION, CARDIAC PACEMAKER
Anesthesia: General

## 2018-08-26 MED ORDER — MAGNESIUM SULFATE 2 GM/50ML IV SOLN
2.0000 g | Freq: Once | INTRAVENOUS | Status: AC
  Administered 2018-08-26: 2 g via INTRAVENOUS
  Filled 2018-08-26: qty 50

## 2018-08-26 MED ORDER — ACETAMINOPHEN 10 MG/ML IV SOLN
INTRAVENOUS | Status: DC | PRN
  Administered 2018-08-26: 1000 mg via INTRAVENOUS

## 2018-08-26 MED ORDER — ONDANSETRON HCL 4 MG/2ML IJ SOLN: 4.0000 mg | Freq: Four times a day (QID) | INTRAMUSCULAR | Status: DC | PRN

## 2018-08-26 MED ORDER — FENTANYL CITRATE (PF) 100 MCG/2ML IJ SOLN
INTRAMUSCULAR | Status: DC | PRN
  Administered 2018-08-26 (×4): 25 ug via INTRAVENOUS

## 2018-08-26 MED ORDER — ACETAMINOPHEN 325 MG PO TABS: 325.0000 mg | ORAL_TABLET | ORAL | Status: DC | PRN

## 2018-08-26 MED ORDER — DEXTROSE 5 % IV SOLN: INTRAVENOUS | Status: DC | PRN

## 2018-08-26 MED ORDER — OXYCODONE HCL 5 MG/5ML PO SOLN: 5.0000 mg | Freq: Once | ORAL | Status: DC | PRN

## 2018-08-26 MED ORDER — CHLORHEXIDINE GLUCONATE 0.12 % MT SOLN
15.0000 mL | Freq: Two times a day (BID) | OROMUCOSAL | Status: DC
  Administered 2018-08-26 – 2018-08-29 (×6): 15 mL via OROMUCOSAL
  Filled 2018-08-26 (×5): qty 15

## 2018-08-26 MED ORDER — LACTATED RINGERS IV SOLN
INTRAVENOUS | Status: DC | PRN
  Administered 2018-08-26: 13:00:00 via INTRAVENOUS

## 2018-08-26 MED ORDER — FENTANYL CITRATE (PF) 100 MCG/2ML IJ SOLN
INTRAMUSCULAR | Status: AC
  Filled 2018-08-26: qty 2

## 2018-08-26 MED ORDER — ORAL CARE MOUTH RINSE
15.0000 mL | Freq: Two times a day (BID) | OROMUCOSAL | Status: DC
  Administered 2018-08-26 – 2018-08-28 (×3): 15 mL via OROMUCOSAL

## 2018-08-26 MED ORDER — PROPOFOL 10 MG/ML IV BOLUS
INTRAVENOUS | Status: DC | PRN
  Administered 2018-08-26 (×6): 10 mg via INTRAVENOUS

## 2018-08-26 MED ORDER — PROPOFOL 500 MG/50ML IV EMUL
INTRAVENOUS | Status: AC
  Filled 2018-08-26: qty 50

## 2018-08-26 MED ORDER — OXYCODONE HCL 5 MG PO TABS: 5.0000 mg | ORAL_TABLET | Freq: Once | ORAL | Status: DC | PRN

## 2018-08-26 MED ORDER — MIDAZOLAM HCL 2 MG/2ML IJ SOLN
INTRAMUSCULAR | Status: AC
  Filled 2018-08-26: qty 2

## 2018-08-26 MED ORDER — CEFAZOLIN SODIUM-DEXTROSE 1-4 GM/50ML-% IV SOLN
1.0000 g | Freq: Four times a day (QID) | INTRAVENOUS | Status: AC
  Administered 2018-08-26 – 2018-08-27 (×3): 1 g via INTRAVENOUS
  Filled 2018-08-26 (×4): qty 50

## 2018-08-26 MED ORDER — LIDOCAINE 1 % OPTIME INJ - NO CHARGE
INTRAMUSCULAR | Status: DC | PRN
  Administered 2018-08-26: 30 mL

## 2018-08-26 MED ORDER — CEFAZOLIN SODIUM-DEXTROSE 2-3 GM-%(50ML) IV SOLR
INTRAVENOUS | Status: DC | PRN
  Administered 2018-08-26: 2 g via INTRAVENOUS

## 2018-08-26 MED ORDER — FAMOTIDINE 20 MG PO TABS
20.0000 mg | ORAL_TABLET | Freq: Every day | ORAL | Status: DC
  Administered 2018-08-26 – 2018-08-28 (×3): 20 mg via ORAL
  Filled 2018-08-26 (×3): qty 1

## 2018-08-26 MED ORDER — PROPOFOL 500 MG/50ML IV EMUL
INTRAVENOUS | Status: DC | PRN
  Administered 2018-08-26: 75 ug/kg/min via INTRAVENOUS

## 2018-08-26 MED ORDER — SODIUM CHLORIDE 0.9 % IV SOLN
INTRAVENOUS | Status: DC | PRN
  Administered 2018-08-26: 14:00:00

## 2018-08-26 MED ORDER — FENTANYL CITRATE (PF) 100 MCG/2ML IJ SOLN: 25.0000 ug | INTRAMUSCULAR | Status: DC | PRN

## 2018-08-26 MED ORDER — MIDAZOLAM HCL 2 MG/2ML IJ SOLN
INTRAMUSCULAR | Status: DC | PRN
  Administered 2018-08-26: 2 mg via INTRAVENOUS

## 2018-08-26 MED ORDER — ACETAMINOPHEN 10 MG/ML IV SOLN
INTRAVENOUS | Status: AC
  Filled 2018-08-26: qty 100

## 2018-08-26 SURGICAL SUPPLY — 39 items
BAG DECANTER FOR FLEXI CONT (MISCELLANEOUS) ×3 IMPLANT
BRUSH SCRUB EZ  4% CHG (MISCELLANEOUS) ×2
BRUSH SCRUB EZ 4% CHG (MISCELLANEOUS) ×1 IMPLANT
CABLE SURG 12 DISP A/V CHANNEL (MISCELLANEOUS) ×3 IMPLANT
CANISTER SUCT 1200ML W/VALVE (MISCELLANEOUS) ×3 IMPLANT
CHLORAPREP W/TINT 26 (MISCELLANEOUS) ×3 IMPLANT
COVER LIGHT HANDLE STERIS (MISCELLANEOUS) ×6 IMPLANT
COVER MAYO STAND STRL (DRAPES) ×3 IMPLANT
COVER WAND RF STERILE (DRAPES) ×3 IMPLANT
DRAPE C-ARM XRAY 36X54 (DRAPES) ×3 IMPLANT
DRSG TEGADERM 4X4.75 (GAUZE/BANDAGES/DRESSINGS) ×3 IMPLANT
DRSG TELFA 4X3 1S NADH ST (GAUZE/BANDAGES/DRESSINGS) ×3 IMPLANT
ELECT REM PT RETURN 9FT ADLT (ELECTROSURGICAL) ×3
ELECTRODE REM PT RTRN 9FT ADLT (ELECTROSURGICAL) ×1 IMPLANT
GLIDEWIRE STIFF .35X180X3 HYDR (WIRE) IMPLANT
GLOVE BIO SURGEON STRL SZ7.5 (GLOVE) ×3 IMPLANT
GLOVE BIO SURGEON STRL SZ8 (GLOVE) ×3 IMPLANT
GOWN STRL REUS W/ TWL LRG LVL3 (GOWN DISPOSABLE) ×1 IMPLANT
GOWN STRL REUS W/ TWL XL LVL3 (GOWN DISPOSABLE) ×1 IMPLANT
GOWN STRL REUS W/TWL LRG LVL3 (GOWN DISPOSABLE) ×2
GOWN STRL REUS W/TWL XL LVL3 (GOWN DISPOSABLE) ×2
IMMOBILIZER SHDR MD LX WHT (SOFTGOODS) IMPLANT
IMMOBILIZER SHDR XL LX WHT (SOFTGOODS) ×3 IMPLANT
INTRO PACEMAKR LEAD 9FR 13CM (INTRODUCER) ×3
INTRO PACEMKR SHEATH II 7FR (MISCELLANEOUS) ×3
INTRODUCER PACEMKR LD 9FR 13CM (INTRODUCER) ×1 IMPLANT
INTRODUCER PACEMKR SHTH II 7FR (MISCELLANEOUS) ×1 IMPLANT
IPG PACE AZUR XT DR MRI W1DR01 (Pacemaker) ×1 IMPLANT
IV NS 500ML (IV SOLUTION) ×2
IV NS 500ML BAXH (IV SOLUTION) ×1 IMPLANT
KIT TURNOVER KIT A (KITS) ×3 IMPLANT
LABEL OR SOLS (LABEL) ×3 IMPLANT
LEAD CAPSURE NOVUS 5076-52CM (Lead) ×3 IMPLANT
LEAD CAPSURE NOVUS 5076-58CM (Lead) ×3 IMPLANT
MARKER SKIN DUAL TIP RULER LAB (MISCELLANEOUS) ×3 IMPLANT
PACE AZURE XT DR MRI W1DR01 (Pacemaker) ×3 IMPLANT
PACK PACE INSERTION (MISCELLANEOUS) ×3 IMPLANT
PAD ONESTEP ZOLL R SERIES ADT (MISCELLANEOUS) ×3 IMPLANT
SUT SILK 0 SH 30 (SUTURE) ×9 IMPLANT

## 2018-08-26 NOTE — Op Note (Signed)
[facility] Cardiology   08/26/2018                     2:23 PM  PATIENT:  Edward Pitts    PRE-OPERATIVE DIAGNOSIS:  1ST DEGREE HEART BLOCK  POST-OPERATIVE DIAGNOSIS:  Same  PROCEDURE:  INSERTION PACEMAKER  SURGEON:  [name], MD    ANESTHESIA:     PREOPERATIVE INDICATIONS:  Edward Pitts is a  58 y.o. male with a diagnosis of 1ST DEGREE HEART BLOCK who failed conservative measures and elected for surgical management.    The risks benefits and alternatives were discussed with the patient preoperatively including but not limited to the risks of infection, bleeding, cardiopulmonary complications, the need for revision surgery, among others, and the patient was willing to proceed.   OPERATIVE PROCEDURE: The patient was brought to the operating room in a fasting state.  The left pectoral region was prepped and draped in usual sterile manner.  Anesthesia was obtained 1% lidocaine locally.  A 6 cm incision was performed the left pectoral region.  The pacemaker pocket was generated by electrocautery and blunt dissection.  Access was obtained to the left subclavian vein by fine-needle aspiration.  MRI compatible leads were positioned into the right ventricular apical septum  ( Medtronic [id number] ) and right atrial appendage ( Medtronic [id number] ) under fluoroscopic guidance.  After proper thresholds were obtained the leads were sutured in place.  The leads were connected to an MRI compatible dual-chamber rate responsive pacemaker generator ( Medtronic [id number] ).  The pacemaker pocket was irrigated with gentamicin solution.  The pacemaker generator was positioned into the pocket and the pocket was closed with 2-0 and 4-0 Vicryl, respectively.  Steri-Strips and pressure dressing were applied.  Postprocedural interrogation revealed appropriate dual-chamber atrial and ventricular sensing and pacing thresholds.  There were no periprocedural complications.

## 2018-08-26 NOTE — Progress Notes (Signed)
Summary- Pt rec'd from cath lab post temporary pacemaker placement via bed. Pt familiar with room and unit. Pt verbalized understanding of pain scale, reported no pain. Telemetry SR, BBB- no pacer spikes noted on arrival. As assessment was being completed, intermittent paced beats noted along with failure to capture. Pt pressure remained stable, denied CP, SOB, or any of the other adverse symptoms that prompted hospitalization. The rhythm would stabilize when the dressing was touched.  Selinda Eon, the charge RN paged the cardiologist on call and provided a status update. Order rec'd for a chest Xray to confirm wire placement , and to increase the rate to 90 and output to 7. Underlying rhythm occasionally manifested, but paced rhythm improved. A condom catheter was applied secondary to pt activity restrictions, and 1000 cc output noted, but  the catheter continued to fall off- the last time resulted in the soaking of the dressing of the dialysis catheter in the right groin. The hospitalist was informed of the bilat groin sites- dialysis on the right, pacer on the left, and an order was rec'd for a foley catheter. Pt tolerated insertion NAD, is resting comfortably at this time.

## 2018-08-26 NOTE — Transfer of Care (Signed)
Immediate Anesthesia Transfer of Care Note  Patient: Edward Pitts  Procedure(s) Performed: INSERTION PACEMAKER (N/A )  Patient Location: PACU  Anesthesia Type:General  Level of Consciousness: sedated  Airway & Oxygen Therapy: Patient Spontanous Breathing and Patient connected to nasal cannula oxygen  Post-op Assessment: Report given to RN and Post -op Vital signs reviewed and stable  Post vital signs: Reviewed and stable  Last Vitals:  Vitals Value Taken Time  BP 131/56 08/26/2018  2:30 PM  Temp    Pulse 73 08/26/2018  2:30 PM  Resp 23 08/26/2018  2:30 PM  SpO2 96 % 08/26/2018  2:30 PM  Vitals shown include unvalidated device data.  Last Pain:  Vitals:   08/26/18 0800  TempSrc: Oral  PainSc:       Patients Stated Pain Goal: 0 (62/13/08 6578)  Complications: No apparent anesthesia complications

## 2018-08-26 NOTE — Anesthesia Preprocedure Evaluation (Signed)
Anesthesia Evaluation  Patient identified by MRN, date of birth, ID band Patient awake    Reviewed: Allergy & Precautions, H&P , NPO status , Patient's Chart, lab work & pertinent test results  History of Anesthesia Complications Negative for: history of anesthetic complications  Airway Mallampati: III  TM Distance: <3 FB Neck ROM: full    Dental  (+) Chipped, Poor Dentition, Missing   Pulmonary neg pulmonary ROS, neg shortness of breath,           Cardiovascular Exercise Tolerance: Good hypertension, (-) angina(-) Past MI and (-) DOE + dysrhythmias      Neuro/Psych Seizures -, Poorly Controlled,  PSYCHIATRIC DISORDERS    GI/Hepatic negative GI ROS, Neg liver ROS, neg GERD  ,  Endo/Other  negative endocrine ROS  Renal/GU ARFRenal disease  negative genitourinary   Musculoskeletal   Abdominal   Peds  Hematology negative hematology ROS (+)   Anesthesia Other Findings Past Medical History: No date: Anxiety No date: Hyperlipidemia No date: Hypertension No date: Seizures [facility])  Past Surgical History: No date: KNEE SURGERY; Left 06/29/2017: LEFT HEART CATH AND CORONARY ANGIOGRAPHY; Left     Comment:  Procedure: LEFT HEART CATH AND CORONARY ANGIOGRAPHY;                Surgeon: [name], MD;  Location: [place]               CV LAB;  Service: Cardiovascular;  Laterality: Left; 08/25/2018: TEMPORARY PACEMAKER; N/A     Comment:  Procedure: TEMPORARY PACEMAKER;  Surgeon: [name], MD;  Location: [place] CV LAB;  Service:               Cardiovascular;  Laterality: N/A;  BMI    Body Mass Index:  36.22 kg/m      Reproductive/Obstetrics negative OB ROS                             Anesthesia Physical Anesthesia Plan  ASA: IV  Anesthesia Plan: General   Post-op Pain Management:    Induction: Intravenous  PONV Risk Score and Plan: Propofol infusion  and TIVA  Airway Management Planned: Natural Airway and Nasal Cannula  Additional Equipment:   Intra-op Plan:   Post-operative Plan:   Informed Consent: I have reviewed the patients History and Physical, chart, labs and discussed the procedure including the risks, benefits and alternatives for the proposed anesthesia with the patient or authorized representative who has indicated his/her understanding and acceptance.     Dental Advisory Given  Plan Discussed with: Anesthesiologist, CRNA and Surgeon  Anesthesia Plan Comments: (Patient consented for risks of anesthesia including but not limited to:  - adverse reactions to medications - risk of intubation if required - damage to teeth, lips or other oral mucosa - sore throat or hoarseness - Damage to heart, brain, lungs or loss of life  Patient voiced understanding.)        Anesthesia Quick Evaluation

## 2018-08-26 NOTE — Progress Notes (Signed)
CRITICAL CARE NOTE      *   SUBJECTIVE FINDINGS & SIGNIFICANT EVENTS   Patient remains critically ill with bradyarrythmia 3rd deg AVB s/p temp pacemaker.  Permanent pacemaker placement today.   PAST MEDICAL HISTORY   Past Medical History:  Diagnosis Date  . Anxiety   . Hyperlipidemia   . Hypertension   . Seizures ([place])      SURGICAL HISTORY   Past Surgical History:  Procedure Laterality Date  . KNEE SURGERY Left   . LEFT HEART CATH AND CORONARY ANGIOGRAPHY Left 06/29/2017   Procedure: LEFT HEART CATH AND CORONARY ANGIOGRAPHY;  Surgeon: [name], MD;  Location: [place] CV LAB;  Service: Cardiovascular;  Laterality: Left;  . TEMPORARY PACEMAKER N/A 08/25/2018   Procedure: TEMPORARY PACEMAKER;  Surgeon: [name], MD;  Location: [place] CV LAB;  Service: Cardiovascular;  Laterality: N/A;     FAMILY HISTORY   History reviewed. No pertinent family history.   SOCIAL HISTORY   Social History   Tobacco Use  . Smoking status: Never Smoker  . Smokeless tobacco: Never Used  Substance Use Topics  . Alcohol use: No  . Drug use: No     MEDICATIONS   Current Medication:  Current Facility-Administered Medications:  .  0.9 %  sodium chloride infusion, 250 mL, Intravenous, PRN, [name], MD .  acetaminophen (TYLENOL) tablet 650 mg, 650 mg, Oral, Q6H PRN **OR** acetaminophen (TYLENOL) suppository 650 mg, 650 mg, Rectal, Q6H PRN, Lance Coon, MD .  Ampicillin-Sulbactam (UNASYN) 3 g in sodium chloride 0.9 % 100 mL IVPB, 3 g, Intravenous, Q6H, Gouru, Aruna, MD, Last Rate: 200 mL/hr at 08/26/18 0408, 3 g at 08/26/18 0408 .  calcium acetate (PHOSLO) capsule 667 mg, 667 mg, Oral, TID WC, Winnona Wargo, MD .  Chlorhexidine Gluconate Cloth 2 % PADS 6 each, 6 each,  Topical, Q0600, Lavonia Dana, MD, 6 each at 08/26/18 0537 .  famotidine (PEPCID) IVPB 20 mg premix, 20 mg, Intravenous, Q24H, Ciarrah Rae, MD, Last Rate: 100 mL/hr at 08/25/18 2218, 20 mg at 08/25/18 2218 .  fentaNYL (SUBLIMAZE) injection 12.5 mcg, 12.5 mcg, Intravenous, Q4H PRN, Tukov-Yual, Magdalene S, NP, 12.5 mcg at 08/25/18 2308 .  heparin injection 5,000 Units, 5,000 Units, Subcutaneous, Q8H, Lance Coon, MD, 5,000 Units at 08/25/18 2214 .  norepinephrine (LEVOPHED) 4mg  in 241mL premix infusion, 0-40 mcg/min, Intravenous, Continuous, Kolluru, Sarath, MD, Last Rate: 7.5 mL/hr at 08/25/18 1815, 2 mcg/min at 08/25/18 1815 .  ondansetron (ZOFRAN) tablet 4 mg, 4 mg, Oral, Q6H PRN **OR** ondansetron (ZOFRAN) injection 4 mg, 4 mg, Intravenous, Q6H PRN, Lance Coon, MD .  pneumococcal 23 valent vaccine (PNU-IMMUNE) injection 0.5 mL, 0.5 mL, Intramuscular, Tomorrow-1000, Genice Kimberlin, MD .  sodium bicarbonate 150 mEq in dextrose 5 % 1,000 mL infusion, , Intravenous, Continuous, Lance Coon, MD, Last Rate: 168 mL/hr at 08/26/18 0420 .  sodium chloride flush (NS) 0.9 % injection 3 mL, 3 mL, Intravenous, Q12H, [name], MD .  sodium chloride flush (NS) 0.9 % injection 3 mL, 3 mL, Intravenous, PRN, [name], MD .  tuberculin injection 5 Units, 5 Units, Intradermal, Once, Kolluru, Sarath, MD, 5 Units at 08/25/18 0316    ALLERGIES   Ibuprofen    REVIEW OF SYSTEMS      PHYSICAL EXAMINATION   Vitals:   08/26/18 0530 08/26/18 0600  BP: 130/65 91/61  Pulse: 88 89  Resp: 15 (!) 23  Temp:    SpO2: 96% 93%  GENERAL: No apparent distress HEAD: Normocephalic, atraumatic.  EYES: Pupils equal, round, reactive to light.  No scleral icterus.  MOUTH: Moist mucosal membrane. NECK: Supple. No thyromegaly. No nodules. No JVD.  PULMONARY: Clear to auscultation CARDIOVASCULAR: S1 and S2. Regular rate and rhythm. No murmurs, rubs, or gallops.  GASTROINTESTINAL: Soft,  nontender, non-distended. No masses. Positive bowel sounds. No hepatosplenomegaly.  MUSCULOSKELETAL: No swelling, clubbing, or edema.  NEUROLOGIC: Mild distress due to acute illness SKIN:intact,warm,dry   LABS AND IMAGING      LAB RESULTS: Recent Labs  Lab 08/25/18 0436 08/25/18 0936 08/25/18 2358  NA 140 140 140  K 4.3 4.6 4.3  CL 101 102 102  CO2 29 27 30   BUN 37* 46* 40*  CREATININE 2.46* 2.94* 2.16*  GLUCOSE 127* 120* 130*   Recent Labs  Lab 08/24/18 2042 08/25/18 0936 08/26/18 0432  HGB 12.0* 10.9* 11.3*  HCT 36.8* 32.2* 35.0*  WBC 17.5* 14.4* 9.1  PLT 381 230 174     IMAGING RESULTS: US Renal  Result Date: 08/25/2018 CLINICAL DATA:  Acute kidney failure EXAM: RENAL / URINARY TRACT ULTRASOUND COMPLETE COMPARISON:  None. FINDINGS: Right Kidney: Renal measurements: 10.9 x 5.3 x 5.2 cm = volume: 155.5 mL. 1.3 x 1.4 x 1.5 cm lower pole simple cyst. No hydronephrosis. Left Kidney: Renal measurements: 11.6 x 5.6 x 6.4 cm = volume: 216.2 mL. No mass or hydronephrosis. Bladder: Appears normal for degree of bladder distention. IMPRESSION: No hydronephrosis. 1.5 cm right lower pole simple cyst, benign. Electronically Signed   By: Julian Hy M.D.   On: 08/25/2018 07:32   Dg Chest Port 1 View  Result Date: 08/25/2018 CLINICAL DATA:  Temporary cardiac pacer EXAM: PORTABLE CHEST 1 VIEW COMPARISON:  08/25/2018 FINDINGS: Temporary pacer from below with tip in the region of the right ventricle. It is difficult to visualize the entire course of the catheter due to soft tissue attenuation and overlapping EKG leads. Normal heart size. There is no edema, consolidation, effusion, or pneumothorax. IMPRESSION: 1. No evidence of active disease. 2. Temporary pacer lead projects over the right ventricle. Electronically Signed   By: Monte Fantasia M.D.   On: 08/25/2018 22:02   Dg Chest Port 1 View  Result Date: 08/25/2018 CLINICAL DATA:  Onset shortness of breath today. EXAM: PORTABLE  CHEST 1 VIEW COMPARISON:  None. FINDINGS: Lungs clear. Heart size normal. No pneumothorax or pleural effusion. No acute bony abnormality. Defibrillator pad in place. Paced. IMPRESSION: No acute disease. Electronically Signed   By: Inge Rise M.D.   On: 08/25/2018 11:08      ASSESSMENT AND PLAN    -Multidisciplinary rounds held today    Symptomatic bradyarrythmia      -improved - patient has no chest discomfort/presyncopy -cardiology on case appreciate input -status post PPM placement -oxygen as needed -Lasix as tolerated -follow up cardiac enzymes as indicated -ICU monitoring   Renal Failure-Acute on chronic Stage II    Nephrology on case appreciate input  -s/p HD -follow chem 7 -follow UO -continue Foley Catheter-assess need daily   Possible Aspiration Pneumonia    - patient reports vomiting prior to SOB     - leukocytosis with left shift     - blood cultures ordered,  PCT trending      -CXR reviewed by me - no appreciable infiltrate        Severe hyperkalemia     S/p emergent dialysis   - resolved    Severe hyperphosphatemia    -  phoslo    - pharmacy consult for metabolic derrangement    DVT/GI PRX ordered -SCDs  TRANSFUSIONS AS NEEDED MONITOR FSBS ASSESS the need for LABS as needed    Critical care provider statement:   Critical care time (minutes): 31  Critical care time was exclusive of: Separately billable procedures and treating other patients  Critical care was necessary to treat or prevent imminent or life-threatening deterioration of the following conditions: Symptomatic bradyarrythmia, hyperkalmia, hyperphosphatemia,   Critical care was time spent personally by me on the following activities: Development of treatment plan with patient or surrogate, discussions with consultants, evaluation of patient's response to treatment, examination of patient, obtaining history from patient or surrogate, ordering and performing  treatments and interventions, ordering and review of laboratory studies and re-evaluation of patient's condition.  I assumed direction of critical care for this patient from another provider in my specialty: no   This document was prepared using Dragon voice recognition software and may include unintentional dictation errors.    Ottie Glazier, M.D.  Division of [place]

## 2018-08-26 NOTE — Progress Notes (Signed)
Pharmacy Antibiotic Note  Edward Pitts is a 58 y.o. male admitted on 08/24/2018 with SOB.  Pharmacy has been consulted for Unasyn dosing for aspiration pneumonia.  Plan: Unasyn 3 g IV q6h  Height: 5\' 7"  (170.2 cm) Weight: 231 lb 4.2 oz (104.9 kg) IBW/kg (Calculated) : 66.1  Temp (24hrs), Avg:98.3 F (36.8 C), Min:97.7 F (36.5 C), Max:99.1 F (37.3 C)  Recent Labs  Lab 08/24/18 2042 08/24/18 2258 08/25/18 0436 08/25/18 0936 08/25/18 1225 08/25/18 2358 08/26/18 0432  WBC 17.5*  --   --  14.4*  --   --  9.1  CREATININE 3.40*  --  2.46* 2.94*  --  2.16* 1.89*  LATICACIDVEN 3.8* 4.4*  --  2.1* 2.6* 1.4  --     Estimated Creatinine Clearance: 48.6 mL/min (A) (by C-G formula based on SCr of 1.89 mg/dL (H)).    Allergies  Allergen Reactions  . Ibuprofen Other (See Comments)    Affects kidneys    Antimicrobials this admission: Unasyn 5/31 >>  Dose adjustments this admission:   Microbiology results: 5/31 BCx: no growth < 24 hours  5/31 MRSA PCR: negative 5/30 COVID: negative   Thank you for allowing pharmacy to be a part of this patient's care.  Edward Pitts 08/26/2018 4:36 PM

## 2018-08-26 NOTE — Progress Notes (Signed)
PHARMACIST - PHYSICIAN COMMUNICATION  CONCERNING: IV to Oral Route Change Policy  RECOMMENDATION: This patient is receiving famotidine by the intravenous route.  Based on criteria approved by the Pharmacy and Therapeutics Committee, the intravenous medication(s) is/are being converted to the equivalent oral dose form(s).   DESCRIPTION: These criteria include:  The patient is eating (either orally or via tube) and/or has been taking other orally administered medications for a least 24 hours  The patient has no evidence of active gastrointestinal bleeding or impaired GI absorption (gastrectomy, short bowel, patient on TNA or NPO).  If you have questions about this conversion, please contact the Pharmacy Department   []   [phone] )  [place], [facility] 08/26/2018 4:21 PM

## 2018-08-26 NOTE — Progress Notes (Signed)
Sheath occluded, not able to flush. MD notified, pt scheduled for permanent pacemaker today at 1200. Will continue to monitor.

## 2018-08-26 NOTE — Anesthesia Post-op Follow-up Note (Signed)
Anesthesia QCDR form completed.        

## 2018-08-26 NOTE — Anesthesia Postprocedure Evaluation (Signed)
Anesthesia Post Note  Patient: Edward Pitts  Procedure(s) Performed: INSERTION PACEMAKER (N/A )  Patient location during evaluation: PACU Anesthesia Type: General Level of consciousness: awake and alert Pain management: pain level controlled Vital Signs Assessment: post-procedure vital signs reviewed and stable Respiratory status: spontaneous breathing, nonlabored ventilation, respiratory function stable and patient connected to nasal cannula oxygen Cardiovascular status: blood pressure returned to baseline and stable Postop Assessment: no apparent nausea or vomiting Anesthetic complications: no     Last Vitals:  Vitals:   08/26/18 1500 08/26/18 1515  BP: (!) 121/55 (!) 121/54  Pulse: 74 74  Resp: (!) 23 (!) 21  Temp:  36.6 C  SpO2: 95% 96%    Last Pain:  Vitals:   08/26/18 1515  TempSrc:   PainSc: 0-No pain                 [name]

## 2018-08-26 NOTE — Progress Notes (Signed)
[facility] NAME: Edward Pitts    MR#:  [id number]  DATE OF BIRTH:  Dec 08, 1960  SUBJECTIVE:  CHIEF COMPLAINT: Patient became severely bradycardic with dizziness last night.  Heart rate went down to as low as 15 and a temporary pacemaker was placed.  Patient is feeling better today and awaiting for PPM today  REVIEW OF SYSTEMS:  CONSTITUTIONAL: No fever, fatigue or weakness.  EYES: No blurred or double vision.  EARS, NOSE, AND THROAT: No tinnitus or ear pain.  RESPIRATORY: No cough, shortness of breath, wheezing or hemoptysis.  CARDIOVASCULAR: No chest pain, orthopnea, edema.  GASTROINTESTINAL: No nausea, vomiting, diarrhea or abdominal pain.  GENITOURINARY: No dysuria, hematuria.  ENDOCRINE: No polyuria, nocturia,  HEMATOLOGY: No anemia, easy bruising or bleeding SKIN: No rash or lesion. MUSCULOSKELETAL: No joint pain or arthritis.   NEUROLOGIC: No tingling, numbness, weakness.  PSYCHIATRY: No anxiety or depression.   DRUG ALLERGIES:   Allergies  Allergen Reactions  . Ibuprofen Other (See Comments)    Affects kidneys    VITALS:  Blood pressure (!) 111/45, pulse 74, temperature 98.4 F (36.9 C), temperature source Oral, resp. rate 19, height 5\' 7"  (1.702 m), weight 104.9 kg, SpO2 93 %.  PHYSICAL EXAMINATION:  GENERAL:  58 y.o.-year-old patient lying in the bed with no acute distress.  EYES: Pupils equal, round, reactive to light and accommodation. No scleral icterus. Extraocular muscles intact.  HEENT: Head atraumatic, normocephalic. Oropharynx and nasopharynx clear.  NECK:  Supple, no jugular venous distention. No thyroid enlargement, no tenderness.  LUNGS: Normal breath sounds bilaterally, no wheezing, rales,rhonchi or crepitation. No use of accessory muscles of respiration.  CARDIOVASCULAR: S1, S2 normal, bradycardic no murmurs, rubs, or gallops.  ABDOMEN: Soft, nontender, nondistended. Bowel sounds present.   EXTREMITIES:  temporary pacemaker placed via left groin no pedal edema, cyanosis, or clubbing.  NEUROLOGIC: Cranial nerves II through XII are intact. Muscle strength 5/5 in all extremities. Sensation intact. Gait not checked.  PSYCHIATRIC: The patient is alert and oriented x 3.  SKIN: No obvious rash, lesion, or ulcer.    LABORATORY PANEL:   CBC Recent Labs  Lab 08/26/18 0432  WBC 9.1  HGB 11.3*  HCT 35.0*  PLT 174   ------------------------------------------------------------------------------------------------------------------  Chemistries  Recent Labs  Lab 08/25/18 0936  08/26/18 0432  NA 140   < > 139  K 4.6   < > 4.5  CL 102   < > 99  CO2 27   < > 30  GLUCOSE 120*   < > 132*  BUN 46*   < > 35*  CREATININE 2.94*   < > 1.89*  CALCIUM 9.6   < > 8.8*  MG 1.8   < > 1.6*  AST 29  --   --   ALT 51*  --   --   ALKPHOS 70  --   --   BILITOT 0.9  --   --    < > = values in this interval not displayed.   ------------------------------------------------------------------------------------------------------------------  Cardiac Enzymes Recent Labs  Lab 08/25/18 2358  TROPONINI 0.05*   ------------------------------------------------------------------------------------------------------------------  RADIOLOGY:  US Renal  Result Date: 08/25/2018 CLINICAL DATA:  Acute kidney failure EXAM: RENAL / URINARY TRACT ULTRASOUND COMPLETE COMPARISON:  None. FINDINGS: Right Kidney: Renal measurements: 10.9 x 5.3 x 5.2 cm = volume: 155.5 mL. 1.3 x 1.4 x 1.5 cm lower pole simple cyst. No hydronephrosis. Left Kidney: Renal measurements: 11.6 x 5.6  x 6.4 cm = volume: 216.2 mL. No mass or hydronephrosis. Bladder: Appears normal for degree of bladder distention. IMPRESSION: No hydronephrosis. 1.5 cm right lower pole simple cyst, benign. Electronically Signed   By: Julian Hy M.D.   On: 08/25/2018 07:32   Dg Chest Port 1 View  Result Date: 08/25/2018 CLINICAL DATA:  Temporary  cardiac pacer EXAM: PORTABLE CHEST 1 VIEW COMPARISON:  08/25/2018 FINDINGS: Temporary pacer from below with tip in the region of the right ventricle. It is difficult to visualize the entire course of the catheter due to soft tissue attenuation and overlapping EKG leads. Normal heart size. There is no edema, consolidation, effusion, or pneumothorax. IMPRESSION: 1. No evidence of active disease. 2. Temporary pacer lead projects over the right ventricle. Electronically Signed   By: Monte Fantasia M.D.   On: 08/25/2018 22:02   Dg Chest Port 1 View  Result Date: 08/25/2018 CLINICAL DATA:  Onset shortness of breath today. EXAM: PORTABLE CHEST 1 VIEW COMPARISON:  None. FINDINGS: Lungs clear. Heart size normal. No pneumothorax or pleural effusion. No acute bony abnormality. Defibrillator pad in place. Paced. IMPRESSION: No acute disease. Electronically Signed   By: Inge Rise M.D.   On: 08/25/2018 11:08    EKG:   Orders placed or performed during the hospital encounter of 08/24/18  . ED EKG  . ED EKG  . EKG 12-Lead  . EKG 12-Lead    ASSESSMENT AND PLAN:   #Acute hyperkalemia Potassium 7.3 at the time of admission.  Currently at 4.5, potassium normal Status post insulin with dextrose, bicarb drip and large dose of [place] Patient had urgent hemodialysis done on May 31 a.m.  Nephrology is following  #Severe bradycardia with third-degree heart block and 2 episodes of syncope Hold metoprolol Currently patient is temporarily paced and awaiting for permanent pacemaker placement today   Continue close monitoring of the heart rate ontelemetry Appreciate Dr. Ubaldo Glassing recommendations  #Acute kidney injury on chronic kidney disease stage III with hyperkalemia and metabolic acidosis Baseline creatinine 1.65.  Current creatinine 3.4-2.16-1.89 Had emergent hemodialysis Continue bicarb infusion and follow-up with nephrology  #Hypomagnesemia replete and recheck  #Sepsis with severe hypotension with   Shock-probably aspiration pneumonia  requiring pressors norepinephrine Lactic acid is elevated and procalcitonin at 0.31.  Pan cultures are obtained.    IV Unasyn started for aspiration pneumonia  #Seizures-continue close monitoring Ativan as needed for breakthrough seizures     All the records are reviewed and case discussed with Care Management/Social Workerr. Management plans discussed with the patient at bedside and he is in in agreement.  CODE STATUS: fc  TOTAL TIME TAKING CARE OF THIS PATIENT: 35 minutes.   POSSIBLE D/C IN 3  DAYS, DEPENDING ON CLINICAL CONDITION.  Note: This dictation was prepared with Dragon dictation along with smaller phrase technology. Any transcriptional errors that result from this process are unintentional.   [name] on 08/26/2018 at 2:12 PM  Between 7am to 6pm - Pager - [phone] After 6pm go to [email or web address] - password [email or web address] [facility]  Office  [phone]  CC: Primary care physician; Petra Kuba, MD

## 2018-08-26 NOTE — Progress Notes (Signed)
*  PRELIMINARY RESULTS* Echocardiogram 2D Echocardiogram has been performed.  Sherrie Sport 08/26/2018, 8:43 AM

## 2018-08-26 NOTE — Progress Notes (Signed)
Patient Name: Edward Pitts Date of Encounter: 08/26/2018  Hospital Problem List     Principal Problem:   Acute hyperkalemia Active Problems:   AKI (acute kidney injury) ([facility])   HTN (hypertension)   HLD (hyperlipidemia)   Seizures ([facility])   Complete heart block [facility])    Patient Profile     58 year old male admitted with acute on chronic kidney injury with hyperkalemia and complete heart block.  Status post temporary pacemaker placement via left femoral access.  Temporary dialysis catheter in the right femoral.  Patient ventricular pacing at present.  Will need permanent pacemaker.  Subjective   Alert and oriented with no complaints  Inpatient Medications    . calcium acetate  667 mg Oral TID WC  . Chlorhexidine Gluconate Cloth  6 each Topical Q0600  . heparin  5,000 Units Subcutaneous Q8H  . pneumococcal 23 valent vaccine  0.5 mL Intramuscular Tomorrow-1000  . sodium chloride flush  3 mL Intravenous Q12H  . tuberculin  5 Units Intradermal Once    Vital Signs    Vitals:   08/26/18 0500 08/26/18 0515 08/26/18 0530 08/26/18 0600  BP: 102/63 (!) 105/55 130/65 91/61  Pulse: 90 90 88 89  Resp: (!) 23 (!) 25 15 (!) 23  Temp:      TempSrc:      SpO2: 92% 94% 96% 93%  Weight:      Height:        Intake/Output Summary (Last 24 hours) at 08/26/2018 0656 Last data filed at 08/26/2018 0600 Gross per 24 hour  Intake 2932.93 ml  Output 3250 ml  Net -317.07 ml   Filed Weights   08/24/18 2034 08/25/18 0803  Weight: 102 kg 104.9 kg    Physical Exam    GEN: Well nourished, well developed, in no acute distress.  HEENT: normal.  Neck: Supple, no JVD, carotid bruits, or masses. Cardiac: RRR, no murmurs, rubs, or gallops. No clubbing, cyanosis, edema.  Radials/DP/PT 2+ and equal bilaterally.  Respiratory:  Respirations regular and unlabored, clear to auscultation bilaterally. GI: Soft, nontender, nondistended, BS + x 4. MS: no deformity or atrophy. Skin: warm and dry, no  rash. Neuro:  Strength and sensation are intact. Psych: Normal affect.  Labs    CBC Recent Labs    08/24/18 2042 08/25/18 0936 08/26/18 0432  WBC 17.5* 14.4* 9.1  NEUTROABS 13.3*  --   --   HGB 12.0* 10.9* 11.3*  HCT 36.8* 32.2* 35.0*  MCV 90.2 88.0 90.9  PLT 381 230 606   Basic Metabolic Panel Recent Labs    08/25/18 0936 08/25/18 2358  NA 140 140  K 4.6 4.3  CL 102 102  CO2 27 30  GLUCOSE 120* 130*  BUN 46* 40*  CREATININE 2.94* 2.16*  CALCIUM 9.6 9.1  MG 1.8 1.8  PHOS 5.7* 5.0*   Liver Function Tests Recent Labs    08/25/18 0436 08/25/18 0936  AST  --  29  ALT  --  51*  ALKPHOS  --  70  BILITOT  --  0.9  PROT  --  6.6  ALBUMIN 3.8 3.6   No results for input(s): LIPASE, AMYLASE in the last 72 hours. Cardiac Enzymes Recent Labs    08/24/18 2042 08/25/18 2358  TROPONINI <0.03 0.05*   BNP No results for input(s): BNP in the last 72 hours. D-Dimer No results for input(s): DDIMER in the last 72 hours. Hemoglobin A1C No results for input(s): HGBA1C in the last 72 hours. Fasting  Lipid Panel No results for input(s): CHOL, HDL, LDLCALC, TRIG, CHOLHDL, LDLDIRECT in the last 72 hours. Thyroid Function Tests Recent Labs    08/24/18 2042  TSH 7.778*    Telemetry    Ventricular pacing at a rate of 90  ECG    Complete heart block  Radiology    US Renal  Result Date: 08/25/2018 CLINICAL DATA:  Acute kidney failure EXAM: RENAL / URINARY TRACT ULTRASOUND COMPLETE COMPARISON:  None. FINDINGS: Right Kidney: Renal measurements: 10.9 x 5.3 x 5.2 cm = volume: 155.5 mL. 1.3 x 1.4 x 1.5 cm lower pole simple cyst. No hydronephrosis. Left Kidney: Renal measurements: 11.6 x 5.6 x 6.4 cm = volume: 216.2 mL. No mass or hydronephrosis. Bladder: Appears normal for degree of bladder distention. IMPRESSION: No hydronephrosis. 1.5 cm right lower pole simple cyst, benign. Electronically Signed   By: Julian Hy M.D.   On: 08/25/2018 07:32   Dg Chest Port 1  View  Result Date: 08/25/2018 CLINICAL DATA:  Temporary cardiac pacer EXAM: PORTABLE CHEST 1 VIEW COMPARISON:  08/25/2018 FINDINGS: Temporary pacer from below with tip in the region of the right ventricle. It is difficult to visualize the entire course of the catheter due to soft tissue attenuation and overlapping EKG leads. Normal heart size. There is no edema, consolidation, effusion, or pneumothorax. IMPRESSION: 1. No evidence of active disease. 2. Temporary pacer lead projects over the right ventricle. Electronically Signed   By: Monte Fantasia M.D.   On: 08/25/2018 22:02   Dg Chest Port 1 View  Result Date: 08/25/2018 CLINICAL DATA:  Onset shortness of breath today. EXAM: PORTABLE CHEST 1 VIEW COMPARISON:  None. FINDINGS: Lungs clear. Heart size normal. No pneumothorax or pleural effusion. No acute bony abnormality. Defibrillator pad in place. Paced. IMPRESSION: No acute disease. Electronically Signed   By: Inge Rise M.D.   On: 08/25/2018 11:08   Dg Knee Complete 4 Views Right  Result Date: 08/10/2018 CLINICAL DATA:  Acute onset of RIGHT knee pain that began last night. No known injuries, though the patient describes crepitus with a popping sensation approximately 1 week ago. Patient unable to fully extend the knee. EXAM: RIGHT KNEE - COMPLETE 4+ VIEW COMPARISON:  None. FINDINGS: No evidence of acute, subacute or healed fractures. Calcified loose body in the superior joint space. Small joint effusion. Joint spaces well-preserved. Enthesopathic spur is at the insertion of the quadriceps tendon on the superior patella and the patellar tendon on the inferior patella. IMPRESSION: 1. No acute or subacute osseous abnormality. 2. Calcified loose body in the superior joint space. 3. Small joint effusion. Electronically Signed   By: Evangeline Dakin M.D.   On: 08/10/2018 21:51   Vas US Carotid  Result Date: 08/01/2018 Carotid Arterial Duplex Study Indications: Syncope. Performing Technologist: June Leap RDMS, RVT  Examination Guidelines: A complete evaluation includes B-mode imaging, spectral Doppler, color Doppler, and power Doppler as needed of all accessible portions of each vessel. Bilateral testing is considered an integral part of a complete examination. Limited examinations for reoccurring indications may be performed as noted.  Right Carotid Findings: +----------+--------+--------+--------+--------+------------------+           PSV cm/sEDV cm/sStenosisDescribeComments           +----------+--------+--------+--------+--------+------------------+ CCA Prox  160     25                      intimal thickening +----------+--------+--------+--------+--------+------------------+ CCA Distal165     24                                         +----------+--------+--------+--------+--------+------------------+  ICA Prox  125     33                      intimal thickening +----------+--------+--------+--------+--------+------------------+ ICA Distal126     39                                         +----------+--------+--------+--------+--------+------------------+ ECA       114     14                                         +----------+--------+--------+--------+--------+------------------+ +----------+--------+-------+----------------+-------------------+           PSV cm/sEDV cmsDescribe        Arm Pressure (mmHG) +----------+--------+-------+----------------+-------------------+ [id number]            Multiphasic, WNL                    +----------+--------+-------+----------------+-------------------+ +---------+--------+--+--------+--+---------+ VertebralPSV cm/s51EDV cm/s13Antegrade +---------+--------+--+--------+--+---------+  Left Carotid Findings: +----------+--------+--------+--------+--------+------------------+           PSV cm/sEDV cm/sStenosisDescribeComments            +----------+--------+--------+--------+--------+------------------+ CCA Prox  171     34                      intimal thickening +----------+--------+--------+--------+--------+------------------+ CCA Distal177     30                                         +----------+--------+--------+--------+--------+------------------+ ICA Prox  153     30                                         +----------+--------+--------+--------+--------+------------------+ ICA Distal92      30                                         +----------+--------+--------+--------+--------+------------------+ ECA       191     23                                         +----------+--------+--------+--------+--------+------------------+ +----------+--------+--------+----------------+-------------------+ SubclavianPSV cm/sEDV cm/sDescribe        Arm Pressure (mmHG) +----------+--------+--------+----------------+-------------------+           168             Multiphasic, WNL                    +----------+--------+--------+----------------+-------------------+ +---------+--------+--+--------+--+---------+ VertebralPSV cm/s53EDV cm/s10Antegrade +---------+--------+--+--------+--+---------+  Summary: Right Carotid: The extracranial vessels were near-normal with only minimal wall                thickening or plaque. Left Carotid: The extracranial vessels were near-normal with only minimal wall               thickening or plaque.  *See table(s) above for measurements and observations.  Electronically signed by Erlene Quan  Donzetta Matters MD on 08/01/2018 at 4:25:57 PM.    Final     Assessment & Plan    Complete heart block-continue episodic slow ventricular response last night with symptoms.  Appreciate Dr. Antionette Char assistance with placement of a temporary pacemaker from the left groin.  Patient will need permanent pacemaker.  His electrolytes are corrected and he remains with symptomatic bradycardia/high-grade  heart block.  Risk and benefits were explained to the patient.  We will refer to Dr. Saralyn Pilar for consideration for placement of a dual-chamber pacemaker.  Acute on chronic renal insufficiency-BMP pending.  Hyperkalemia-K was corrected yesterday.  Await electrolytes today.  Signed, Javier Docker Shahara Hartsfield MD 08/26/2018, 6:56 AM  Pager: [phone]

## 2018-08-26 NOTE — Progress Notes (Signed)
[facility], [place] 08/26/18  Subjective:   Doing wll No acute c/o Scheduled for pacemaker later today UOP > 3000 cc K is normal S Creatinine has improved  Objective:  Vital signs in last 24 hours:  Temp:  [98.3 F (36.8 C)-99.1 F (37.3 C)] 98.4 F (36.9 C) (06/01 0800) Pulse Rate:  [33-91] 77 (06/01 0838) Resp:  [15-30] 24 (06/01 0838) BP: (91-149)/(29-119) 122/54 (06/01 0838) SpO2:  [91 %-96 %] 93 % (06/01 0600)  Weight change: 2.9 kg Filed Weights   08/24/18 2034 08/25/18 0803  Weight: 102 kg 104.9 kg    Intake/Output:    Intake/Output Summary (Last 24 hours) at 08/26/2018 0946 Last data filed at 08/26/2018 0600 Gross per 24 hour  Intake 2932.93 ml  Output 3100 ml  Net -167.07 ml     Physical Exam: General: Laying in bed, NAD  HEENT Moist oral mucus membranes  Neck supple  Pulm/lungs Clear to auscultation  CVS/Heart regular  Abdomen:  Soft, NT, mildly distended  Extremities: No edema  Neurologic: Alert, able to answer Qs appropriately  Skin: No acute rashes  Access: Rt fem dialysis cath       Basic Metabolic Panel:  Recent Labs  Lab 08/24/18 2042 08/25/18 0016 08/25/18 0436 08/25/18 0936 08/25/18 2358 08/26/18 0432  NA 135  --  140 140 140 139  K 7.3* 6.6* 4.3 4.6 4.3 4.5  CL 104  --  101 102 102 99  CO2 18*  --  29 27 30 30   GLUCOSE 158*  --  127* 120* 130* 132*  BUN 58*  --  37* 46* 40* 35*  CREATININE 3.40*  --  2.46* 2.94* 2.16* 1.89*  CALCIUM 8.7*  --  9.1 9.6 9.1 8.8*  MG 2.0  --   --  1.8 1.8 1.6*  PHOS 4.8*  --  2.7 5.7* 5.0* 4.0     CBC: Recent Labs  Lab 08/24/18 2042 08/25/18 0936 08/26/18 0432  WBC 17.5* 14.4* 9.1  NEUTROABS 13.3*  --   --   HGB 12.0* 10.9* 11.3*  HCT 36.8* 32.2* 35.0*  MCV 90.2 88.0 90.9  PLT 381 230 174     No results found for: HEPBSAG, HEPBSAB, HEPBIGM    Microbiology:  Recent Results (from the past 240 hour(s))  SARS Coronavirus 2 (CEPHEID - Performed in  [place] hospital lab), Hosp Order     Status: None   Collection Time: 08/24/18  8:42 PM  Result Value Ref Range Status   SARS Coronavirus 2 NEGATIVE NEGATIVE Final    Comment: (NOTE) If result is NEGATIVE SARS-CoV-2 target nucleic acids are NOT DETECTED. The SARS-CoV-2 RNA is generally detectable in upper and lower  respiratory specimens during the acute phase of infection. The lowest  concentration of SARS-CoV-2 viral copies this assay can detect is 250  copies / mL. A negative result does not preclude SARS-CoV-2 infection  and should not be used as the sole basis for treatment or other  patient management decisions.  A negative result may occur with  improper specimen collection / handling, submission of specimen other  than nasopharyngeal swab, presence of viral mutation(s) within the  areas targeted by this assay, and inadequate number of viral copies  (<250 copies / mL). A negative result must be combined with clinical  observations, patient history, and epidemiological information. If result is POSITIVE SARS-CoV-2 target nucleic acids are DETECTED. The SARS-CoV-2 RNA is generally detectable in upper and lower  respiratory specimens  dur ing the acute phase of infection.  Positive  results are indicative of active infection with SARS-CoV-2.  Clinical  correlation with patient history and other diagnostic information is  necessary to determine patient infection status.  Positive results do  not rule out bacterial infection or co-infection with other viruses. If result is PRESUMPTIVE POSTIVE SARS-CoV-2 nucleic acids MAY BE PRESENT.   A presumptive positive result was obtained on the submitted specimen  and confirmed on repeat testing.  While 2019 novel coronavirus  (SARS-CoV-2) nucleic acids may be present in the submitted sample  additional confirmatory testing may be necessary for epidemiological  and / or clinical management purposes  to differentiate between  SARS-CoV-2  and other Sarbecovirus currently known to infect humans.  If clinically indicated additional testing with an alternate test  methodology [phone]) is advised. The SARS-CoV-2 RNA is generally  detectable in upper and lower respiratory sp ecimens during the acute  phase of infection. The expected result is Negative. Fact Sheet for Patients:  StrictlyIdeas.no Fact Sheet for Healthcare Providers: BankingDealers.co.za This test is not yet approved or cleared by the [place] FDA and has been authorized for detection and/or diagnosis of SARS-CoV-2 by FDA under an Emergency Use Authorization (EUA).  This EUA will remain in effect (meaning this test can be used) for the duration of the COVID-19 declaration under Section 564(b)(1) of the Act, 21 U.S.C. section 360bbb-3(b)(1), unless the authorization is terminated or revoked sooner. Performed at [facility], [address] [place], [place] [address]   MRSA PCR Screening     Status: None   Collection Time: 08/25/18  8:09 AM  Result Value Ref Range Status   MRSA by PCR NEGATIVE NEGATIVE Final    Comment:        The GeneXpert MRSA Assay (FDA approved for NASAL specimens only), is one component of a comprehensive MRSA colonization surveillance program. It is not intended to diagnose MRSA infection nor to guide or monitor treatment for MRSA infections. Performed at [facility], [address] [place], [place] [address]   CULTURE, BLOOD (ROUTINE X 2) w Reflex to ID Panel     Status: None (Preliminary result)   Collection Time: 08/25/18 11:18 AM  Result Value Ref Range Status   Specimen Description BLOOD LEFT ANTECUBITAL  Final   Special Requests   Final    BOTTLES DRAWN AEROBIC AND ANAEROBIC Blood Culture adequate volume   Culture   Final    NO GROWTH < 24 HOURS Performed at [facility], [address] [place], [place] [address]    Report Status  PENDING  Incomplete  CULTURE, BLOOD (ROUTINE X 2) w Reflex to ID Panel     Status: None (Preliminary result)   Collection Time: 08/25/18 11:27 AM  Result Value Ref Range Status   Specimen Description BLOOD BLOOD LEFT HAND  Final   Special Requests   Final    BOTTLES DRAWN AEROBIC AND ANAEROBIC Blood Culture adequate volume   Culture   Final    NO GROWTH < 24 HOURS Performed at [facility], [address] [place], [place] [address]    Report Status PENDING  Incomplete    Coagulation Studies: No results for input(s): LABPROT, INR in the last 72 hours.  Urinalysis: Recent Labs    08/25/18 1037  COLORURINE YELLOW*  LABSPEC 1.006  PHURINE 6.0  GLUCOSEU NEGATIVE  HGBUR SMALL*  BILIRUBINUR NEGATIVE  KETONESUR NEGATIVE  PROTEINUR 30*  NITRITE NEGATIVE  LEUKOCYTESUR  NEGATIVE      Imaging: US Renal  Result Date: 08/25/2018 CLINICAL DATA:  Acute kidney failure EXAM: RENAL / URINARY TRACT ULTRASOUND COMPLETE COMPARISON:  None. FINDINGS: Right Kidney: Renal measurements: 10.9 x 5.3 x 5.2 cm = volume: 155.5 mL. 1.3 x 1.4 x 1.5 cm lower pole simple cyst. No hydronephrosis. Left Kidney: Renal measurements: 11.6 x 5.6 x 6.4 cm = volume: 216.2 mL. No mass or hydronephrosis. Bladder: Appears normal for degree of bladder distention. IMPRESSION: No hydronephrosis. 1.5 cm right lower pole simple cyst, benign. Electronically Signed   By: Julian Hy M.D.   On: 08/25/2018 07:32   Dg Chest Port 1 View  Result Date: 08/25/2018 CLINICAL DATA:  Temporary cardiac pacer EXAM: PORTABLE CHEST 1 VIEW COMPARISON:  08/25/2018 FINDINGS: Temporary pacer from below with tip in the region of the right ventricle. It is difficult to visualize the entire course of the catheter due to soft tissue attenuation and overlapping EKG leads. Normal heart size. There is no edema, consolidation, effusion, or pneumothorax. IMPRESSION: 1. No evidence of active disease. 2. Temporary pacer lead projects over the  right ventricle. Electronically Signed   By: Monte Fantasia M.D.   On: 08/25/2018 22:02   Dg Chest Port 1 View  Result Date: 08/25/2018 CLINICAL DATA:  Onset shortness of breath today. EXAM: PORTABLE CHEST 1 VIEW COMPARISON:  None. FINDINGS: Lungs clear. Heart size normal. No pneumothorax or pleural effusion. No acute bony abnormality. Defibrillator pad in place. Paced. IMPRESSION: No acute disease. Electronically Signed   By: Inge Rise M.D.   On: 08/25/2018 11:08     Medications:   . sodium chloride    . ampicillin-sulbactam (UNASYN) IV 3 g (08/26/18 0408)  . famotidine (PEPCID) IV 20 mg (08/25/18 2218)  . magnesium sulfate bolus IVPB 2 g (08/26/18 0907)  . norepinephrine (LEVOPHED) Adult infusion 2 mcg/min (08/25/18 1815)  .  sodium bicarbonate  infusion 1000 mL 168 mL/hr at 08/26/18 0420   . calcium acetate  667 mg Oral TID WC  . Chlorhexidine Gluconate Cloth  6 each Topical Q0600  . heparin  5,000 Units Subcutaneous Q8H  . pneumococcal 23 valent vaccine  0.5 mL Intramuscular Tomorrow-1000  . sodium chloride flush  3 mL Intravenous Q12H  . tuberculin  5 Units Intradermal Once   sodium chloride, acetaminophen **OR** acetaminophen, fentaNYL (SUBLIMAZE) injection, ondansetron **OR** ondansetron (ZOFRAN) IV, sodium chloride flush  Assessment/ Plan:  58 y.o. male with hypertension, hyperlipidemia, anxiety, seizure disorder, gout, osteoarthritis, left bundle branch block who was admitted to [facility] on 08/24/2018 for nausea, dizziness and shortness of breath  1. Acute renal failure with hyperkalemia and metabolic acidosis: requiring emergent hemodialysis treatment with 1K bath in emergency department.  - UOP and potassium have improved - S Creatinine is improving - no further need for HD is anticipated - May remove dialysis catheter after successful pacemaker placement  3. Chronic Kidney Disease stage III: baseline creatinine of 1.65, GFR of 45 on 11/21/2017. Urine studies were  bland.  Chronic Kidney Disease secondary to hypertension    LOS: 2 Analea Muller Candiss Norse 6/1/20209:46 AM  [place], [place]  Note: This note was prepared with Dragon dictation. Any transcription errors are unintentional

## 2018-08-26 NOTE — Evaluation (Signed)
Clinical/Bedside Swallow Evaluation Patient Details  Name: Edward Pitts MRN: [id number] Date of Birth: May 09, 1960  Today's Date: 08/26/2018 Time: SLP Start Time (ACUTE ONLY): 9326 SLP Stop Time (ACUTE ONLY): 1705 SLP Time Calculation (min) (ACUTE ONLY): 60 min  Past Medical History:  Past Medical History:  Diagnosis Date  . Anxiety   . Hyperlipidemia   . Hypertension   . Seizures ([place])    Past Surgical History:  Past Surgical History:  Procedure Laterality Date  . KNEE SURGERY Left   . LEFT HEART CATH AND CORONARY ANGIOGRAPHY Left 06/29/2017   Procedure: LEFT HEART CATH AND CORONARY ANGIOGRAPHY;  Surgeon: [name], MD;  Location: [place] [facility];  Service: Cardiovascular;  Laterality: Left;  . TEMPORARY PACEMAKER N/A 08/25/2018   Procedure: TEMPORARY PACEMAKER;  Surgeon: [name], MD;  Location: [place] [facility];  Service: Cardiovascular;  Laterality: N/A;   HPI:  Edward Pitts is a 58 y.o. male who presents with chief complaint bradycardia, SOB.  Edward Pitts has a PMH of Anxiety, HTN, seizure, CAD, hyperlipidemia; noted MRI results of 2019.  Per Neurology note in chart in 02/2018, Edward Pitts has had episodes of "zoning out" that may last 15 seconds, he may have 2 or 3 such episodes a week.  The clonazepam seems to have improved to the shaking events and shock sensations her previously experienced.  He was recommended to increase the dose to 1 mg in the evening; follow-up in 6 months.  Patient presents the ED after having 2 syncopal episodes at home today.  He denies any other symptoms except for some mild shortness of breath here he is found to be profoundly bradycardic.  On evaluation he is noted to be in complete heart block.  He is subsequently found to have acute kidney injury and severe hyperkalemia.  His potassium was 7.3.  Edward Pitts recevied a permanent pacemaker this afternoon per Cardiology note; Nephrology following also.  At home, Edward Pitts c/o difficulty when eating watermelon and w/ his native  saliva - "easy to get choked".    Assessment / Plan / Recommendation Clinical Impression  Edward Pitts appears to present w/ adequate oropharyngeal phase swallowing function w/ reduced risk for aspiration when following general aspiration precautions. Edward Pitts is unable to use his LUE s/p procedure, so a more mech soft consistency w/ the cut, moist foods is recommended for easier eating. Also discussed the nature of Mixed Consistency foods that can increase risk for difficulty managing the liquids, w/ masticating the foods, during swallowing. Edward Pitts also has a Neurological history requiring clonazepam and has had episodes of "zoning out".  Recommend follow general aspiration precautions; setup and assistance at meals currently. Recommend f/u w/ Neurology/PCP if any swallowing difficulty besides w/ Mixed Conistency foods increases. Reflux precautions recommended.  SLP Visit Diagnosis: Dysphagia, unspecified (R13.10)    Aspiration Risk  (reduced following general precautions)    Diet Recommendation  Regular/mech soft foods for easier eating/feeding currently; Thin liquids. General aspiration and Reflux precautions. Assistance at meals d/t not being able to use LUE currently  Medication Administration: Whole meds with liquid(as tolerates; or in puree IF needed)    Other  Recommendations Recommended Consults: (Dietician f/u as needed) Oral Care Recommendations: Oral care BID;Staff/trained caregiver to provide oral care(assist) Other Recommendations: (n/a)   Follow up Recommendations None(TBD)      Frequency and Duration (n/a)  (n/a)       Prognosis Prognosis for Safe Diet Advancement: Fair(-Good) Barriers to Reach Goals: (n/a)      Swallow Study  General Date of Onset: 08/24/18 HPI: Edward Pitts is a 58 y.o. male who presents with chief complaint bradycardia, SOB.  Edward Pitts has a PMH of Anxiety, HTN, seizure, CAD, hyperlipidemia; noted MRI results of 2019.  Per Neurology note in chart in 02/2018, Edward Pitts has had episodes of  "zoning out" that may last 15 seconds, he may have 2 or 3 such episodes a week.  The clonazepam seems to have improved to the shaking events and shock sensations her previously experienced.  He was recommended to increase the dose to 1 mg in the evening; follow-up in 6 months.  Patient presents the ED after having 2 syncopal episodes at home today.  He denies any other symptoms except for some mild shortness of breath here he is found to be profoundly bradycardic.  On evaluation he is noted to be in complete heart block.  He is subsequently found to have acute kidney injury and severe hyperkalemia.  His potassium was 7.3.  Edward Pitts recevied a permanent pacemaker this afternoon per Cardiology note; Nephrology following also.  At home, Edward Pitts c/o difficulty when eating watermelon and w/ his native saliva - "easy to get choked".  Type of Study: Bedside Swallow Evaluation Previous Swallow Assessment: none Diet Prior to this Study: NPO(for procedure) Temperature Spikes Noted: No(wbc 9.1) Respiratory Status: Room air History of Recent Intubation: No Behavior/Cognition: Alert;Cooperative;Pleasant mood(min UE tremors) Oral Cavity Assessment: Within Functional Limits;Dry(min) Oral Care Completed by SLP: Recent completion by staff Oral Cavity - Dentition: Adequate natural dentition Vision: Functional for self-feeding Self-Feeding Abilities: Able to feed self;Needs assist;Needs set up(cannot use LUE post procedure) Patient Positioning: Upright in bed(needed support/positioning) Baseline Vocal Quality: Normal Volitional Cough: Strong Volitional Swallow: Able to elicit    Oral/Motor/Sensory Function Overall Oral Motor/Sensory Function: Within functional limits   Ice Chips Ice chips: Within functional limits Presentation: Spoon(fed; 3 trials)   Thin Liquid Thin Liquid: Within functional limits Presentation: Cup;Self Fed;Straw(~10+ ozs total)    Nectar Thick Nectar Thick Liquid: Not tested   Honey Thick Honey Thick  Liquid: Not tested   Puree Puree: Within functional limits Presentation: Spoon(fed; 3 trials)   Solid     Solid: Within functional limits(soft solid) Presentation: Spoon(fed; 5 trials)       Orinda Kenner, MS, CCC-SLP Watson,Katherine 08/26/2018,5:14 PM

## 2018-08-26 NOTE — Progress Notes (Signed)
Pharmacy Electrolyte Monitoring Consult:  Pharmacy consulted to assist in monitoring and replacing electrolytes in this 58 y.o. male admitted on 08/24/2018 with Bradycardia and Shortness of Breath   Labs:  Sodium (mmol/L)  Date Value  08/26/2018 139   Potassium (mmol/L)  Date Value  08/26/2018 4.5   Magnesium (mg/dL)  Date Value  08/26/2018 1.6 (L)   Phosphorus (mg/dL)  Date Value  08/26/2018 4.0   Calcium (mg/dL)  Date Value  08/26/2018 8.8 (L)   Albumin (g/dL)  Date Value  08/25/2018 3.6    Assessment/Plan: 1. No significant drug- drug interactions noted at this time.   2. Magnesium 2g x 2. Will continue to monitor with daily labs. Will replace for goal potassium ~ 4 and goal magnesium ~ 2.   Pharmacy will continue to monitor and adjust per consult.    Simpson,Michael L 08/26/2018 4:19 PM

## 2018-08-27 ENCOUNTER — Encounter: Payer: Self-pay | Admitting: Cardiology

## 2018-08-27 LAB — CBC WITH DIFFERENTIAL/PLATELET
Abs Immature Granulocytes: 0.05 10*3/uL (ref 0.00–0.07)
Basophils Absolute: 0 10*3/uL (ref 0.0–0.1)
Basophils Relative: 0 %
Eosinophils Absolute: 0.1 10*3/uL (ref 0.0–0.5)
Eosinophils Relative: 1 %
HCT: 32.8 % — ABNORMAL LOW (ref 39.0–52.0)
Hemoglobin: 10.6 g/dL — ABNORMAL LOW (ref 13.0–17.0)
Immature Granulocytes: 1 %
Lymphocytes Relative: 11 %
Lymphs Abs: 0.9 10*3/uL (ref 0.7–4.0)
MCH: 29.5 pg (ref 26.0–34.0)
MCHC: 32.3 g/dL (ref 30.0–36.0)
MCV: 91.4 fL (ref 80.0–100.0)
Monocytes Absolute: 0.7 10*3/uL (ref 0.1–1.0)
Monocytes Relative: 8 %
Neutro Abs: 7 10*3/uL (ref 1.7–7.7)
Neutrophils Relative %: 79 %
Platelets: 161 10*3/uL (ref 150–400)
RBC: 3.59 MIL/uL — ABNORMAL LOW (ref 4.22–5.81)
RDW: 13.4 % (ref 11.5–15.5)
WBC: 8.8 10*3/uL (ref 4.0–10.5)
nRBC: 0 % (ref 0.0–0.2)

## 2018-08-27 LAB — BASIC METABOLIC PANEL
Anion gap: 9 (ref 5–15)
BUN: 24 mg/dL — ABNORMAL HIGH (ref 6–20)
CO2: 26 mmol/L (ref 22–32)
Calcium: 8.6 mg/dL — ABNORMAL LOW (ref 8.9–10.3)
Chloride: 100 mmol/L (ref 98–111)
Creatinine, Ser: 1.6 mg/dL — ABNORMAL HIGH (ref 0.61–1.24)
GFR calc Af Amer: 54 mL/min — ABNORMAL LOW (ref >60)
GFR calc non Af Amer: 46 mL/min — ABNORMAL LOW (ref >60)
Glucose, Bld: 117 mg/dL — ABNORMAL HIGH (ref 70–99)
Potassium: 4.8 mmol/L (ref 3.5–5.1)
Sodium: 135 mmol/L (ref 135–145)

## 2018-08-27 LAB — RPR: RPR Ser Ql: NONREACTIVE

## 2018-08-27 LAB — HEPATITIS B SURFACE ANTIBODY,QUALITATIVE: Hep B S Ab: NONREACTIVE

## 2018-08-27 LAB — PARATHYROID HORMONE, INTACT (NO CA): PTH: 21 pg/mL (ref 15–65)

## 2018-08-27 LAB — HEPATITIS C ANTIBODY: HCV Ab: <0.1 s/co ratio (ref 0.0–0.9)

## 2018-08-27 LAB — HIV ANTIBODY (ROUTINE TESTING W REFLEX): HIV Screen 4th Generation wRfx: NONREACTIVE

## 2018-08-27 LAB — PROCALCITONIN: Procalcitonin: 0.19 ng/mL

## 2018-08-27 LAB — HEPATITIS B SURFACE ANTIGEN: Hepatitis B Surface Ag: NEGATIVE

## 2018-08-27 LAB — MAGNESIUM: Magnesium: 2.1 mg/dL (ref 1.7–2.4)

## 2018-08-27 LAB — HEPATITIS B CORE ANTIBODY, TOTAL: Hep B Core Total Ab: NEGATIVE

## 2018-08-27 MED ORDER — POLYVINYL ALCOHOL 1.4 % OP SOLN
1.0000 [drp] | OPHTHALMIC | Status: DC | PRN
  Administered 2018-08-27 – 2018-08-28 (×3): 1 [drp] via OPHTHALMIC
  Filled 2018-08-27: qty 15

## 2018-08-27 MED ORDER — CLONAZEPAM 0.5 MG PO TABS
0.5000 mg | ORAL_TABLET | Freq: Two times a day (BID) | ORAL | Status: DC
  Administered 2018-08-27 – 2018-08-29 (×5): 0.5 mg via ORAL
  Filled 2018-08-27 (×5): qty 1

## 2018-08-27 MED ORDER — CEFAZOLIN SODIUM-DEXTROSE 2-4 GM/100ML-% IV SOLN
2.0000 g | Freq: Three times a day (TID) | INTRAVENOUS | Status: DC
  Administered 2018-08-27 – 2018-08-28 (×4): 2 g via INTRAVENOUS
  Filled 2018-08-27 (×9): qty 100

## 2018-08-27 NOTE — Progress Notes (Signed)
Patient Name: Edward Pitts Date of Encounter: 08/27/2018  Hospital Problem List     Principal Problem:   Acute hyperkalemia Active Problems:   AKI (acute kidney injury) ([facility])   HTN (hypertension)   HLD (hyperlipidemia)   Seizures (HCC)   Complete heart block [facility])    Patient Profile     Patient with history of syncope admitted with hyperkalemia, acute kidney injury and complete heart block.  Intermittent high-grade block persisted after normalizing serum potassium and creatinine.  Status post permanent pacemaker placement yesterday.  Atrial sensing ventricular pacing.  Renal function continues to improve.  Creatinine back to baseline.  Potassium 4.8.  Subjective   Mild nausea  Inpatient Medications    . calcium acetate  667 mg Oral TID WC  . chlorhexidine  15 mL Mouth Rinse BID  . Chlorhexidine Gluconate Cloth  6 each Topical Q0600  . famotidine  20 mg Oral QHS  . heparin  5,000 Units Subcutaneous Q8H  . mouth rinse  15 mL Mouth Rinse q12n4p  . pneumococcal 23 valent vaccine  0.5 mL Intramuscular Tomorrow-1000  . sodium chloride flush  3 mL Intravenous Q12H    Vital Signs    Vitals:   08/27/18 0300 08/27/18 0400 08/27/18 0500 08/27/18 0600  BP: (!) 140/48 (!) 79/56 (!) 117/56 (!) 123/56  Pulse: 65 67 66 66  Resp: 19 19 20 20   Temp:  98.5 F (36.9 C)    TempSrc:  Oral    SpO2: (!) 87% 92% 91% 93%  Weight:      Height:        Intake/Output Summary (Last 24 hours) at 08/27/2018 0714 Last data filed at 08/27/2018 0600 Gross per 24 hour  Intake 2372.73 ml  Output 1850 ml  Net 522.73 ml   Filed Weights   08/24/18 2034 08/25/18 0803  Weight: 102 kg 104.9 kg    Physical Exam    GEN: Well nourished, well developed, in no acute distress.  HEENT: normal.  Neck: Supple, no JVD, carotid bruits, or masses. Cardiac: RRR, no murmurs, rubs, or gallops. No clubbing, cyanosis, edema.  Radials/DP/PT 2+ and equal bilaterally.  Respiratory:  Respirations regular and  unlabored, clear to auscultation bilaterally. GI: Soft, nontender, nondistended, BS + x 4. MS: no deformity or atrophy. Skin: warm and dry, no rash. Neuro:  Strength and sensation are intact. Psych: Normal affect.  Labs    CBC Recent Labs    08/24/18 2042  08/26/18 0432 08/27/18 0615  WBC 17.5*   < > 9.1 8.8  NEUTROABS 13.3*  --   --  7.0  HGB 12.0*   < > 11.3* 10.6*  HCT 36.8*   < > 35.0* 32.8*  MCV 90.2   < > 90.9 91.4  PLT 381   < > 174 161   < > = values in this interval not displayed.   Basic Metabolic Panel Recent Labs    08/25/18 2358 08/26/18 0432 08/27/18 0615  NA 140 139 135  K 4.3 4.5 4.8  CL 102 99 100  CO2 30 30 26   GLUCOSE 130* 132* 117*  BUN 40* 35* 24*  CREATININE 2.16* 1.89* 1.60*  CALCIUM 9.1 8.8* 8.6*  MG 1.8 1.6* 2.1  PHOS 5.0* 4.0  --    Liver Function Tests Recent Labs    08/25/18 0436 08/25/18 0936  AST  --  29  ALT  --  51*  ALKPHOS  --  70  BILITOT  --  0.9  PROT  --  6.6  ALBUMIN 3.8 3.6   No results for input(s): LIPASE, AMYLASE in the last 72 hours. Cardiac Enzymes Recent Labs    08/24/18 2042 08/25/18 2358  TROPONINI <0.03 0.05*   BNP No results for input(s): BNP in the last 72 hours. D-Dimer No results for input(s): DDIMER in the last 72 hours. Hemoglobin A1C No results for input(s): HGBA1C in the last 72 hours. Fasting Lipid Panel No results for input(s): CHOL, HDL, LDLCALC, TRIG, CHOLHDL, LDLDIRECT in the last 72 hours. Thyroid Function Tests Recent Labs    08/24/18 2042  TSH 7.778*    Telemetry    Atrial sensing ventricular pacing  ECG      Radiology    US Renal  Result Date: 08/25/2018 CLINICAL DATA:  Acute kidney failure EXAM: RENAL / URINARY TRACT ULTRASOUND COMPLETE COMPARISON:  None. FINDINGS: Right Kidney: Renal measurements: 10.9 x 5.3 x 5.2 cm = volume: 155.5 mL. 1.3 x 1.4 x 1.5 cm lower pole simple cyst. No hydronephrosis. Left Kidney: Renal measurements: 11.6 x 5.6 x 6.4 cm = volume:  216.2 mL. No mass or hydronephrosis. Bladder: Appears normal for degree of bladder distention. IMPRESSION: No hydronephrosis. 1.5 cm right lower pole simple cyst, benign. Electronically Signed   By: Julian Hy M.D.   On: 08/25/2018 07:32   Dg Chest Port 1 View  Result Date: 08/26/2018 CLINICAL DATA:  Post cardiac pacemaker placement EXAM: PORTABLE CHEST 1 VIEW COMPARISON:  Aug 25, 2018 FINDINGS: A dual lead pacemaker is been placed in the interval. Leads. Overlie the right atrial appendage and right ventricle based on the frontal view. The heart, hila, mediastinum, lungs, and pleura are otherwise normal. No pneumothorax. IMPRESSION: Pacemaker, placed in the interval, appears to be in good position. No pneumothorax. No other acute abnormalities. Electronically Signed   By: Dorise Bullion III M.D   On: 08/26/2018 14:57   Dg Chest Port 1 View  Result Date: 08/25/2018 CLINICAL DATA:  Temporary cardiac pacer EXAM: PORTABLE CHEST 1 VIEW COMPARISON:  08/25/2018 FINDINGS: Temporary pacer from below with tip in the region of the right ventricle. It is difficult to visualize the entire course of the catheter due to soft tissue attenuation and overlapping EKG leads. Normal heart size. There is no edema, consolidation, effusion, or pneumothorax. IMPRESSION: 1. No evidence of active disease. 2. Temporary pacer lead projects over the right ventricle. Electronically Signed   By: Monte Fantasia M.D.   On: 08/25/2018 22:02   Dg Chest Port 1 View  Result Date: 08/25/2018 CLINICAL DATA:  Onset shortness of breath today. EXAM: PORTABLE CHEST 1 VIEW COMPARISON:  None. FINDINGS: Lungs clear. Heart size normal. No pneumothorax or pleural effusion. No acute bony abnormality. Defibrillator pad in place. Paced. IMPRESSION: No acute disease. Electronically Signed   By: Inge Rise M.D.   On: 08/25/2018 11:08   Dg Knee Complete 4 Views Right  Result Date: 08/10/2018 CLINICAL DATA:  Acute onset of RIGHT knee pain  that began last night. No known injuries, though the patient describes crepitus with a popping sensation approximately 1 week ago. Patient unable to fully extend the knee. EXAM: RIGHT KNEE - COMPLETE 4+ VIEW COMPARISON:  None. FINDINGS: No evidence of acute, subacute or healed fractures. Calcified loose body in the superior joint space. Small joint effusion. Joint spaces well-preserved. Enthesopathic spur is at the insertion of the quadriceps tendon on the superior patella and the patellar tendon on the inferior patella. IMPRESSION: 1. No acute or subacute  osseous abnormality. 2. Calcified loose body in the superior joint space. 3. Small joint effusion. Electronically Signed   By: Evangeline Dakin M.D.   On: 08/10/2018 21:51   Dg C-arm 1-60 Min-no Report  Result Date: 08/26/2018 Fluoroscopy was utilized by the requesting physician.  No radiographic interpretation.   Vas US Carotid  Result Date: 08/01/2018 Carotid Arterial Duplex Study Indications: Syncope. Performing Technologist: June Leap RDMS, RVT  Examination Guidelines: A complete evaluation includes B-mode imaging, spectral Doppler, color Doppler, and power Doppler as needed of all accessible portions of each vessel. Bilateral testing is considered an integral part of a complete examination. Limited examinations for reoccurring indications may be performed as noted.  Right Carotid Findings: +----------+--------+--------+--------+--------+------------------+           PSV cm/sEDV cm/sStenosisDescribeComments           +----------+--------+--------+--------+--------+------------------+ CCA Prox  160     25                      intimal thickening +----------+--------+--------+--------+--------+------------------+ CCA Distal165     24                                         +----------+--------+--------+--------+--------+------------------+ ICA Prox  125     33                      intimal thickening  +----------+--------+--------+--------+--------+------------------+ ICA Distal126     39                                         +----------+--------+--------+--------+--------+------------------+ ECA       114     14                                         +----------+--------+--------+--------+--------+------------------+ +----------+--------+-------+----------------+-------------------+           PSV cm/sEDV cmsDescribe        Arm Pressure (mmHG) +----------+--------+-------+----------------+-------------------+ [id number]            Multiphasic, WNL                    +----------+--------+-------+----------------+-------------------+ +---------+--------+--+--------+--+---------+ VertebralPSV cm/s51EDV cm/s13Antegrade +---------+--------+--+--------+--+---------+  Left Carotid Findings: +----------+--------+--------+--------+--------+------------------+           PSV cm/sEDV cm/sStenosisDescribeComments           +----------+--------+--------+--------+--------+------------------+ CCA Prox  171     34                      intimal thickening +----------+--------+--------+--------+--------+------------------+ CCA Distal177     30                                         +----------+--------+--------+--------+--------+------------------+ ICA Prox  153     30                                         +----------+--------+--------+--------+--------+------------------+ ICA Distal92      30                                         +----------+--------+--------+--------+--------+------------------+  ECA       191     23                                         +----------+--------+--------+--------+--------+------------------+ +----------+--------+--------+----------------+-------------------+ SubclavianPSV cm/sEDV cm/sDescribe        Arm Pressure (mmHG) +----------+--------+--------+----------------+-------------------+           168              Multiphasic, WNL                    +----------+--------+--------+----------------+-------------------+ +---------+--------+--+--------+--+---------+ VertebralPSV cm/s53EDV cm/s10Antegrade +---------+--------+--+--------+--+---------+  Summary: Right Carotid: The extracranial vessels were near-normal with only minimal wall                thickening or plaque. Left Carotid: The extracranial vessels were near-normal with only minimal wall               thickening or plaque.  *See table(s) above for measurements and observations.  Electronically signed by Servando Snare MD on 08/01/2018 at 4:25:57 PM.    Final     Assessment & Plan    Complete heart block-exacerbated by hyperkalemia.  Persisted despite normalization of serum potassium and renal function.  Status post permanent dual-chamber pacemaker placement yesterday.  No immediate complications.  Sling removed.  Okay to transfer to telemetry.  No further cardiac work-up indicated.  Echo showed normal LV function.  Okay for discharge from cardiac standpoint.  Follow-up with Dr. Nehemiah Massed as an outpatient in 1 week.  Acute kidney injury-appears improved.  Appreciate nephrology input.  Hyperkalemia-potassium 4.8.  Signed, Javier Docker Rudy Luhmann MD 08/27/2018, 7:14 AM  Pager: [phone]

## 2018-08-27 NOTE — Progress Notes (Signed)
[facility] [place] NAME: Edward Pitts    MR#:  [id number]  DATE OF BIRTH:  1960-10-30  SUBJECTIVE:  CHIEF COMPLAINT: Patient became severely bradycardic with dizziness 5/31 night.  Heart rate went down to as low as 15 and a temporary pacemaker was placed.  Patient had PPM 6/1/  REVIEW OF SYSTEMS:  CONSTITUTIONAL: No fever, fatigue or weakness.  EYES: No blurred or double vision.  EARS, NOSE, AND THROAT: No tinnitus or ear pain.  RESPIRATORY: No cough, shortness of breath, wheezing or hemoptysis.  CARDIOVASCULAR: No chest pain, orthopnea, edema.  GASTROINTESTINAL: No nausea, vomiting, diarrhea or abdominal pain.  GENITOURINARY: No dysuria, hematuria.  ENDOCRINE: No polyuria, nocturia,  HEMATOLOGY: No anemia, easy bruising or bleeding SKIN: Right forearm blisters MUSCULOSKELETAL: No joint pain or arthritis.   NEUROLOGIC: No tingling, numbness, weakness.  PSYCHIATRY: No anxiety or depression.   DRUG ALLERGIES:   Allergies  Allergen Reactions  . Ibuprofen Other (See Comments)    Affects kidneys    VITALS:  Blood pressure (!) 137/52, pulse 66, temperature 97.8 F (36.6 C), resp. rate 18, height 5\' 7"  (1.702 m), weight 104.9 kg, SpO2 93 %.  PHYSICAL EXAMINATION:  GENERAL:  58 y.o.-year-old patient lying in the bed with no acute distress.  EYES: Pupils equal, round, reactive to light and accommodation. No scleral icterus. Extraocular muscles intact.  HEENT: Head atraumatic, normocephalic. Oropharynx and nasopharynx clear.  NECK:  Supple, no jugular venous distention. No thyroid enlargement, no tenderness.  LUNGS: Normal breath sounds bilaterally, no wheezing, rales,rhonchi or crepitation. No use of accessory muscles of respiration.  CARDIOVASCULAR: S1, S2 normal, bradycardic no murmurs, rubs, or gallops.  Left anterior chest wall with subcutaneous pacemaker with clean dressing, tender to touch ABDOMEN: Soft, nontender,  nondistended. Bowel sounds present.  EXTREMITIES: Right forearm with blisters, no discharge no pedal edema, cyanosis, or clubbing.  NEUROLOGIC: Cranial nerves II through XII are intact. Muscle strength 5/5 in all extremities. Sensation intact. Gait not checked.  PSYCHIATRIC: The patient is alert and oriented x 3.  SKIN: No obvious rash, lesion, or ulcer.       LABORATORY PANEL:   CBC Recent Labs  Lab 08/27/18 0615  WBC 8.8  HGB 10.6*  HCT 32.8*  PLT 161   ------------------------------------------------------------------------------------------------------------------  Chemistries  Recent Labs  Lab 08/25/18 0936  08/27/18 0615  NA 140   < > 135  K 4.6   < > 4.8  CL 102   < > 100  CO2 27   < > 26  GLUCOSE 120*   < > 117*  BUN 46*   < > 24*  CREATININE 2.94*   < > 1.60*  CALCIUM 9.6   < > 8.6*  MG 1.8   < > 2.1  AST 29  --   --   ALT 51*  --   --   ALKPHOS 70  --   --   BILITOT 0.9  --   --    < > = values in this interval not displayed.   ------------------------------------------------------------------------------------------------------------------  Cardiac Enzymes Recent Labs  Lab 08/25/18 2358  TROPONINI 0.05*   ------------------------------------------------------------------------------------------------------------------  RADIOLOGY:  Dg Chest Port 1 View  Result Date: 08/26/2018 CLINICAL DATA:  Post cardiac pacemaker placement EXAM: PORTABLE CHEST 1 VIEW COMPARISON:  Aug 25, 2018 FINDINGS: A dual lead pacemaker is been placed in the interval. Leads. Overlie the right atrial appendage and right ventricle based on the frontal view.  The heart, hila, mediastinum, lungs, and pleura are otherwise normal. No pneumothorax. IMPRESSION: Pacemaker, placed in the interval, appears to be in good position. No pneumothorax. No other acute abnormalities. Electronically Signed   By: Dorise Bullion III M.D   On: 08/26/2018 14:57   Dg Chest Port 1 View  Result Date:  08/25/2018 CLINICAL DATA:  Temporary cardiac pacer EXAM: PORTABLE CHEST 1 VIEW COMPARISON:  08/25/2018 FINDINGS: Temporary pacer from below with tip in the region of the right ventricle. It is difficult to visualize the entire course of the catheter due to soft tissue attenuation and overlapping EKG leads. Normal heart size. There is no edema, consolidation, effusion, or pneumothorax. IMPRESSION: 1. No evidence of active disease. 2. Temporary pacer lead projects over the right ventricle. Electronically Signed   By: Monte Fantasia M.D.   On: 08/25/2018 22:02   Dg C-arm 1-60 Min-no Report  Result Date: 08/26/2018 Fluoroscopy was utilized by the requesting physician.  No radiographic interpretation.    EKG:   Orders placed or performed during the hospital encounter of 08/24/18  . ED EKG  . ED EKG  . EKG 12-Lead  . EKG 12-Lead  . EKG 12-Lead in am (before 8am)  . EKG 12-Lead in am (before 8am)    ASSESSMENT AND PLAN:   #Acute hyperkalemia Potassium 7.3 at the time of admission.  Currently  potassium normal Status post insulin with dextrose, bicarb drip and large dose of [place] Patient had urgent hemodialysis done on May 31 a.m.  Nephrology is following  #Severe bradycardia with third-degree heart block and 2 episodes of syncope Hold metoprolol  permanent pacemaker placed 08/26/2018   Continue close monitoring of the heart rate ontelemetry Appreciate Dr. Ubaldo Glassing recommendations  #Acute kidney injury on chronic kidney disease stage III with hyperkalemia and metabolic acidosis Baseline creatinine 1.65.  Current creatinine 3.4-2.16-1.89--1.60 Had emergent hemodialysis Continue bicarb infusion and follow-up with nephrology  #Hypomagnesemia replete and recheck  #Sepsis with severe hypotension with  Shock-probably aspiration pneumonia  requiring pressors norepinephrine Lactic acid is elevated and procalcitonin at 0.31.  Pan cultures are obtained.    IV Unasyn started for aspiration  pneumonia  #Seizures-continue close monitoring Ativan as needed for breakthrough seizures  #Right forearm blisters probably secondary to the consistent pressure from the blood pressure cuff-not infected continue close monitoring   Plan of care discussed with intensivist  All the records are reviewed and case discussed with Care Management/Social Workerr. Management plans discussed with the patient at bedside and he is in in agreement.  CODE STATUS: fc  TOTAL TIME TAKING CARE OF THIS PATIENT: 35 minutes.   POSSIBLE D/C IN 3  DAYS, DEPENDING ON CLINICAL CONDITION.  Note: This dictation was prepared with Dragon dictation along with smaller phrase technology. Any transcriptional errors that result from this process are unintentional.   [name] on 08/27/2018 at 3:27 PM  Between 7am to 6pm - Pager - [phone] After 6pm go to [email or web address] - password [email or web address] [facility]  Office  [phone]  CC: Primary care physician; Petra Kuba, MD

## 2018-08-27 NOTE — Progress Notes (Signed)
Pharmacy Electrolyte Monitoring Consult:  Pharmacy consulted to assist in monitoring and replacing electrolytes in this 58 y.o. male admitted on 08/24/2018 with Bradycardia and Shortness of Breath   Labs:  Sodium (mmol/L)  Date Value  08/27/2018 135   Potassium (mmol/L)  Date Value  08/27/2018 4.8   Magnesium (mg/dL)  Date Value  08/27/2018 2.1   Phosphorus (mg/dL)  Date Value  08/26/2018 4.0   Calcium (mg/dL)  Date Value  08/27/2018 8.6 (L)   Albumin (g/dL)  Date Value  08/25/2018 3.6    Assessment/Plan: 1. No significant drug- drug interactions noted at this time.   2. No indication for electrolyte replacement today. Will continue to monitor with daily labs. Will replace for goal potassium ~ 4 and goal magnesium ~ 2.   Pharmacy will continue to monitor and adjust per consult.   Dallie Piles, PharmD 08/27/2018 11:42 AM

## 2018-08-27 NOTE — Progress Notes (Addendum)
[facility], [place] 08/27/18  Subjective:   Doing well No acute c/o Underwent pacemaker placement UOP > 1800 cc K is normal S Creatinine has improved to 1.60  Objective:  Vital signs in last 24 hours:  Temp:  [97.7 F (36.5 C)-98.5 F (36.9 C)] 98.1 F (36.7 C) (06/02 0700) Pulse Rate:  [65-76] 69 (06/02 0740) Resp:  [18-23] 18 (06/02 0740) BP: (79-140)/(47-80) 105/47 (06/02 0740) SpO2:  [87 %-96 %] 92 % (06/02 0740)  Weight change:  Filed Weights   08/24/18 2034 08/25/18 0803  Weight: 102 kg 104.9 kg    Intake/Output:    Intake/Output Summary (Last 24 hours) at 08/27/2018 1017 Last data filed at 08/27/2018 1008 Gross per 24 hour  Intake 2492.73 ml  Output 1850 ml  Net 642.73 ml     Physical Exam: General: Laying in bed, NAD  HEENT Moist oral mucus membranes  Neck supple  Pulm/lungs Clear to auscultation, room air  CVS/Heart regular  Abdomen:  Soft, NT, mildly distended  Extremities: No edema  Neurologic: Alert, able to answer Qs appropriately  Skin: Right forearm edema blisters and swelling          Basic Metabolic Panel:  Recent Labs  Lab 08/24/18 2042  08/25/18 0436 08/25/18 0936 08/25/18 2358 08/26/18 0432 08/27/18 0615  NA 135  --  140 140 140 139 135  K 7.3*   < > 4.3 4.6 4.3 4.5 4.8  CL 104  --  101 102 102 99 100  CO2 18*  --  29 27 30 30 26   GLUCOSE 158*  --  127* 120* 130* 132* 117*  BUN 58*  --  37* 46* 40* 35* 24*  CREATININE 3.40*  --  2.46* 2.94* 2.16* 1.89* 1.60*  CALCIUM 8.7*  --  9.1 9.6 9.1 8.8* 8.6*  MG 2.0  --   --  1.8 1.8 1.6* 2.1  PHOS 4.8*  --  2.7 5.7* 5.0* 4.0  --    < > = values in this interval not displayed.     CBC: Recent Labs  Lab 08/24/18 2042 08/25/18 0936 08/26/18 0432 08/27/18 0615  WBC 17.5* 14.4* 9.1 8.8  NEUTROABS 13.3*  --   --  7.0  HGB 12.0* 10.9* 11.3* 10.6*  HCT 36.8* 32.2* 35.0* 32.8*  MCV 90.2 88.0 90.9 91.4  PLT 381 230 174 161      Lab Results   Component Value Date   HEPBSAG Negative 08/25/2018   HEPBSAB Non Reactive 08/25/2018      Microbiology:  Recent Results (from the past 240 hour(s))  SARS Coronavirus 2 (CEPHEID - Performed in [place] hospital lab), Hosp Order     Status: None   Collection Time: 08/24/18  8:42 PM  Result Value Ref Range Status   SARS Coronavirus 2 NEGATIVE NEGATIVE Final    Comment: (NOTE) If result is NEGATIVE SARS-CoV-2 target nucleic acids are NOT DETECTED. The SARS-CoV-2 RNA is generally detectable in upper and lower  respiratory specimens during the acute phase of infection. The lowest  concentration of SARS-CoV-2 viral copies this assay can detect is 250  copies / mL. A negative result does not preclude SARS-CoV-2 infection  and should not be used as the sole basis for treatment or other  patient management decisions.  A negative result may occur with  improper specimen collection / handling, submission of specimen other  than nasopharyngeal swab, presence of viral mutation(s) within the  areas targeted by this assay,  and inadequate number of viral copies  (<250 copies / mL). A negative result must be combined with clinical  observations, patient history, and epidemiological information. If result is POSITIVE SARS-CoV-2 target nucleic acids are DETECTED. The SARS-CoV-2 RNA is generally detectable in upper and lower  respiratory specimens dur ing the acute phase of infection.  Positive  results are indicative of active infection with SARS-CoV-2.  Clinical  correlation with patient history and other diagnostic information is  necessary to determine patient infection status.  Positive results do  not rule out bacterial infection or co-infection with other viruses. If result is PRESUMPTIVE POSTIVE SARS-CoV-2 nucleic acids MAY BE PRESENT.   A presumptive positive result was obtained on the submitted specimen  and confirmed on repeat testing.  While 2019 novel coronavirus  (SARS-CoV-2)  nucleic acids may be present in the submitted sample  additional confirmatory testing may be necessary for epidemiological  and / or clinical management purposes  to differentiate between  SARS-CoV-2 and other Sarbecovirus currently known to infect humans.  If clinically indicated additional testing with an alternate test  methodology [phone]) is advised. The SARS-CoV-2 RNA is generally  detectable in upper and lower respiratory sp ecimens during the acute  phase of infection. The expected result is Negative. Fact Sheet for Patients:  StrictlyIdeas.no Fact Sheet for Healthcare Providers: BankingDealers.co.za This test is not yet approved or cleared by the [place] FDA and has been authorized for detection and/or diagnosis of SARS-CoV-2 by FDA under an Emergency Use Authorization (EUA).  This EUA will remain in effect (meaning this test can be used) for the duration of the COVID-19 declaration under Section 564(b)(1) of the Act, 21 U.S.C. section 360bbb-3(b)(1), unless the authorization is terminated or revoked sooner. Performed at [facility], [address] [place], [place] [address]   MRSA PCR Screening     Status: None   Collection Time: 08/25/18  8:09 AM  Result Value Ref Range Status   MRSA by PCR NEGATIVE NEGATIVE Final    Comment:        The GeneXpert MRSA Assay (FDA approved for NASAL specimens only), is one component of a comprehensive MRSA colonization surveillance program. It is not intended to diagnose MRSA infection nor to guide or monitor treatment for MRSA infections. Performed at [facility], [address] [place], [place] [address]   CULTURE, BLOOD (ROUTINE X 2) w Reflex to ID Panel     Status: None (Preliminary result)   Collection Time: 08/25/18 11:18 AM  Result Value Ref Range Status   Specimen Description BLOOD LEFT ANTECUBITAL  Final   Special Requests   Final    BOTTLES  DRAWN AEROBIC AND ANAEROBIC Blood Culture adequate volume   Culture   Final    NO GROWTH 2 DAYS Performed at [facility], [address] [place], [place] [address]    Report Status PENDING  Incomplete  CULTURE, BLOOD (ROUTINE X 2) w Reflex to ID Panel     Status: None (Preliminary result)   Collection Time: 08/25/18 11:27 AM  Result Value Ref Range Status   Specimen Description BLOOD BLOOD LEFT HAND  Final   Special Requests   Final    BOTTLES DRAWN AEROBIC AND ANAEROBIC Blood Culture adequate volume   Culture   Final    NO GROWTH 2 DAYS Performed at [facility], [address], [place], [place] [address]    Report Status PENDING  Incomplete    Coagulation Studies: No results  for input(s): LABPROT, INR in the last 72 hours.  Urinalysis: Recent Labs    08/25/18 1037  COLORURINE YELLOW*  LABSPEC 1.006  PHURINE 6.0  GLUCOSEU NEGATIVE  HGBUR SMALL*  BILIRUBINUR NEGATIVE  KETONESUR NEGATIVE  PROTEINUR 30*  NITRITE NEGATIVE  LEUKOCYTESUR NEGATIVE      Imaging: Dg Chest Port 1 View  Result Date: 08/26/2018 CLINICAL DATA:  Post cardiac pacemaker placement EXAM: PORTABLE CHEST 1 VIEW COMPARISON:  Aug 25, 2018 FINDINGS: A dual lead pacemaker is been placed in the interval. Leads. Overlie the right atrial appendage and right ventricle based on the frontal view. The heart, hila, mediastinum, lungs, and pleura are otherwise normal. No pneumothorax. IMPRESSION: Pacemaker, placed in the interval, appears to be in good position. No pneumothorax. No other acute abnormalities. Electronically Signed   By: Dorise Bullion III M.D   On: 08/26/2018 14:57   Dg Chest Port 1 View  Result Date: 08/25/2018 CLINICAL DATA:  Temporary cardiac pacer EXAM: PORTABLE CHEST 1 VIEW COMPARISON:  08/25/2018 FINDINGS: Temporary pacer from below with tip in the region of the right ventricle. It is difficult to visualize the entire course of the catheter due to soft tissue attenuation and  overlapping EKG leads. Normal heart size. There is no edema, consolidation, effusion, or pneumothorax. IMPRESSION: 1. No evidence of active disease. 2. Temporary pacer lead projects over the right ventricle. Electronically Signed   By: Monte Fantasia M.D.   On: 08/25/2018 22:02   Dg Chest Port 1 View  Result Date: 08/25/2018 CLINICAL DATA:  Onset shortness of breath today. EXAM: PORTABLE CHEST 1 VIEW COMPARISON:  None. FINDINGS: Lungs clear. Heart size normal. No pneumothorax or pleural effusion. No acute bony abnormality. Defibrillator pad in place. Paced. IMPRESSION: No acute disease. Electronically Signed   By: Inge Rise M.D.   On: 08/25/2018 11:08   Dg C-arm 1-60 Min-no Report  Result Date: 08/26/2018 Fluoroscopy was utilized by the requesting physician.  No radiographic interpretation.     Medications:   . sodium chloride    .  ceFAZolin (ANCEF) IV     . chlorhexidine  15 mL Mouth Rinse BID  . Chlorhexidine Gluconate Cloth  6 each Topical Q0600  . clonazePAM  0.5 mg Oral BID  . famotidine  20 mg Oral QHS  . heparin  5,000 Units Subcutaneous Q8H  . mouth rinse  15 mL Mouth Rinse q12n4p  . pneumococcal 23 valent vaccine  0.5 mL Intramuscular [place]-1000  . sodium chloride flush  3 mL Intravenous Q12H   sodium chloride, acetaminophen **OR** acetaminophen, fentaNYL (SUBLIMAZE) injection, ondansetron **OR** ondansetron (ZOFRAN) IV, polyvinyl alcohol, sodium chloride flush  Assessment/ Plan:  58 y.o. male with hypertension, hyperlipidemia, anxiety, seizure disorder, gout, osteoarthritis, left bundle branch block who was admitted to [facility] on 08/24/2018 for nausea, dizziness and shortness of breath  1. Acute renal failure with hyperkalemia and metabolic acidosis: requiring emergent hemodialysis treatment with 1K bath in emergency department.  - UOP and potassium have improved - S Creatinine is improving - no further need for HD is anticipated - May remove dialysis catheter and  Foley catheter  3. Chronic Kidney Disease stage III: baseline creatinine of 1.65, GFR of 45 on 11/21/2017. Urine studies were bland.  Chronic Kidney Disease secondary to hypertension S Creatinine back to baseline Out patient follow up      LOS: [place] 6/2/202010:17 [place], [place]  Note: This note was prepared with Dragon dictation. Any  transcription errors are unintentional

## 2018-08-27 NOTE — Progress Notes (Signed)
Dr Mortimer Fries notified of patients right arm swelling with blisters. He has decreased mobility as well with arm. Attempted to place blood pressure cuff on leg. Patient unable to tolerate it. Per Dr Mortimer Fries removed blood pressure cuff from right arm. Unable to place it on left arm due to pacemaker placement. He is aware that patients map has been anywhere from mid 50's to low 70's. No additional orders, continue to monitor.

## 2018-08-27 NOTE — Progress Notes (Signed)
Patient alert and oriented. Room air. Patient has not complained of any shortness of breath. He has tolerated his diet and foley removed at 16:00. Tri-alysis intact right groin. Left groin intact. Left upper chest dressing intact. Right arm elevated per Dr Mortimer Fries. Swelling has gone down from this am. Continue to monitor.

## 2018-08-27 NOTE — Progress Notes (Signed)
Patient requested to use nurse cell phone to call wife, personally, to update her regarding his status

## 2018-08-28 LAB — BASIC METABOLIC PANEL
Anion gap: 9 (ref 5–15)
BUN: 22 mg/dL — ABNORMAL HIGH (ref 6–20)
CO2: 24 mmol/L (ref 22–32)
Calcium: 8.9 mg/dL (ref 8.9–10.3)
Chloride: 104 mmol/L (ref 98–111)
Creatinine, Ser: 1.36 mg/dL — ABNORMAL HIGH (ref 0.61–1.24)
GFR calc Af Amer: >60 mL/min (ref >60)
GFR calc non Af Amer: 57 mL/min — ABNORMAL LOW (ref >60)
Glucose, Bld: 103 mg/dL — ABNORMAL HIGH (ref 70–99)
Potassium: 4.7 mmol/L (ref 3.5–5.1)
Sodium: 137 mmol/L (ref 135–145)

## 2018-08-28 LAB — MAGNESIUM: Magnesium: 1.9 mg/dL (ref 1.7–2.4)

## 2018-08-28 MED ORDER — AMOXICILLIN-POT CLAVULANATE 875-125 MG PO TABS
1.0000 | ORAL_TABLET | Freq: Two times a day (BID) | ORAL | Status: DC
  Administered 2018-08-28 – 2018-08-29 (×2): 1 via ORAL
  Filled 2018-08-28 (×2): qty 1

## 2018-08-28 NOTE — Progress Notes (Signed)
[facility], [place] 08/28/18  Subjective:   Doing well No acute c/o Underwent pacemaker placement UOP > 2900 cc K is normal S Creatinine has improved to 1.36  Objective:  Vital signs in last 24 hours:  Temp:  [98.6 F (37 C)-99.1 F (37.3 C)] 98.8 F (37.1 C) (06/03 0745) Pulse Rate:  [67-73] 67 (06/03 0745) Resp:  [16-23] 17 (06/03 0745) BP: (136-156)/(58-67) 136/63 (06/03 0745) SpO2:  [94 %-97 %] 94 % (06/03 0745) Weight:  [101.6 kg-102.2 kg] 101.6 kg (06/03 0451)  Weight change:  Filed Weights   08/25/18 0803 08/27/18 1718 08/28/18 0451  Weight: 104.9 kg 102.2 kg 101.6 kg    Intake/Output:    Intake/Output Summary (Last 24 hours) at 08/28/2018 1317 Last data filed at 08/28/2018 1033 Gross per 24 hour  Intake 530 ml  Output 2525 ml  Net -1995 ml     Physical Exam: General: Laying in bed, NAD  HEENT Moist oral mucus membranes  Neck supple  Pulm/lungs Clear to auscultation, room air  CVS/Heart regular  Abdomen:  Soft, NT, mildly distended  Extremities: No edema  Neurologic: Alert, able to answer Qs appropriately   Femoral temporary dialysis catheter       Basic Metabolic Panel:  Recent Labs  Lab 08/24/18 2042  08/25/18 0436 08/25/18 0936 08/25/18 2358 08/26/18 0432 08/27/18 0615 08/28/18 0355  NA 135  --  140 140 140 139 135 137  K 7.3*   < > 4.3 4.6 4.3 4.5 4.8 4.7  CL 104  --  101 102 102 99 100 104  CO2 18*  --  29 27 30 30 26 24   GLUCOSE 158*  --  127* 120* 130* 132* 117* 103*  BUN 58*  --  37* 46* 40* 35* 24* 22*  CREATININE 3.40*  --  2.46* 2.94* 2.16* 1.89* 1.60* 1.36*  CALCIUM 8.7*  --  9.1 9.6 9.1 8.8* 8.6* 8.9  MG 2.0  --   --  1.8 1.8 1.6* 2.1 1.9  PHOS 4.8*  --  2.7 5.7* 5.0* 4.0  --   --    < > = values in this interval not displayed.     CBC: Recent Labs  Lab 08/24/18 2042 08/25/18 0936 08/26/18 0432 08/27/18 0615  WBC 17.5* 14.4* 9.1 8.8  NEUTROABS 13.3*  --   --  7.0  HGB 12.0* 10.9*  11.3* 10.6*  HCT 36.8* 32.2* 35.0* 32.8*  MCV 90.2 88.0 90.9 91.4  PLT 381 230 174 161      Lab Results  Component Value Date   HEPBSAG Negative 08/25/2018   HEPBSAB Non Reactive 08/25/2018      Microbiology:  Recent Results (from the past 240 hour(s))  SARS Coronavirus 2 (CEPHEID - Performed in [place] hospital lab), Hosp Order     Status: None   Collection Time: 08/24/18  8:42 PM  Result Value Ref Range Status   SARS Coronavirus 2 NEGATIVE NEGATIVE Final    Comment: (NOTE) If result is NEGATIVE SARS-CoV-2 target nucleic acids are NOT DETECTED. The SARS-CoV-2 RNA is generally detectable in upper and lower  respiratory specimens during the acute phase of infection. The lowest  concentration of SARS-CoV-2 viral copies this assay can detect is 250  copies / mL. A negative result does not preclude SARS-CoV-2 infection  and should not be used as the sole basis for treatment or other  patient management decisions.  A negative result may occur with  improper specimen collection /  handling, submission of specimen other  than nasopharyngeal swab, presence of viral mutation(s) within the  areas targeted by this assay, and inadequate number of viral copies  (<250 copies / mL). A negative result must be combined with clinical  observations, patient history, and epidemiological information. If result is POSITIVE SARS-CoV-2 target nucleic acids are DETECTED. The SARS-CoV-2 RNA is generally detectable in upper and lower  respiratory specimens dur ing the acute phase of infection.  Positive  results are indicative of active infection with SARS-CoV-2.  Clinical  correlation with patient history and other diagnostic information is  necessary to determine patient infection status.  Positive results do  not rule out bacterial infection or co-infection with other viruses. If result is PRESUMPTIVE POSTIVE SARS-CoV-2 nucleic acids MAY BE PRESENT.   A presumptive positive result was  obtained on the submitted specimen  and confirmed on repeat testing.  While 2019 novel coronavirus  (SARS-CoV-2) nucleic acids may be present in the submitted sample  additional confirmatory testing may be necessary for epidemiological  and / or clinical management purposes  to differentiate between  SARS-CoV-2 and other Sarbecovirus currently known to infect humans.  If clinically indicated additional testing with an alternate test  methodology [phone]) is advised. The SARS-CoV-2 RNA is generally  detectable in upper and lower respiratory sp ecimens during the acute  phase of infection. The expected result is Negative. Fact Sheet for Patients:  StrictlyIdeas.no Fact Sheet for Healthcare Providers: BankingDealers.co.za This test is not yet approved or cleared by the [place] FDA and has been authorized for detection and/or diagnosis of SARS-CoV-2 by FDA under an Emergency Use Authorization (EUA).  This EUA will remain in effect (meaning this test can be used) for the duration of the COVID-19 declaration under Section 564(b)(1) of the Act, 21 U.S.C. section 360bbb-3(b)(1), unless the authorization is terminated or revoked sooner. Performed at [facility], [address] [place], [place] [address]   MRSA PCR Screening     Status: None   Collection Time: 08/25/18  8:09 AM  Result Value Ref Range Status   MRSA by PCR NEGATIVE NEGATIVE Final    Comment:        The GeneXpert MRSA Assay (FDA approved for NASAL specimens only), is one component of a comprehensive MRSA colonization surveillance program. It is not intended to diagnose MRSA infection nor to guide or monitor treatment for MRSA infections. Performed at [facility], [address] [place], [place] [address]   CULTURE, BLOOD (ROUTINE X 2) w Reflex to ID Panel     Status: None (Preliminary result)   Collection Time: 08/25/18 11:18 AM  Result Value  Ref Range Status   Specimen Description BLOOD LEFT ANTECUBITAL  Final   Special Requests   Final    BOTTLES DRAWN AEROBIC AND ANAEROBIC Blood Culture adequate volume   Culture   Final    NO GROWTH 3 DAYS Performed at [facility], [address], [place], [place] [address]    Report Status PENDING  Incomplete  CULTURE, BLOOD (ROUTINE X 2) w Reflex to ID Panel     Status: None (Preliminary result)   Collection Time: 08/25/18 11:27 AM  Result Value Ref Range Status   Specimen Description BLOOD BLOOD LEFT HAND  Final   Special Requests   Final    BOTTLES DRAWN AEROBIC AND ANAEROBIC Blood Culture adequate volume   Culture   Final    NO GROWTH 3 DAYS Performed at [facility], [address] [place], [place] [address]    Report Status PENDING  Incomplete    Coagulation Studies: No results for input(s): LABPROT, INR in the last 72 hours.  Urinalysis: No results for input(s): COLORURINE, LABSPEC, PHURINE, GLUCOSEU, HGBUR, BILIRUBINUR, KETONESUR, PROTEINUR, UROBILINOGEN, NITRITE, LEUKOCYTESUR in the last 72 hours.  Invalid input(s): APPERANCEUR    Imaging: Dg Chest Port 1 View  Result Date: 08/26/2018 CLINICAL DATA:  Post cardiac pacemaker placement EXAM: PORTABLE CHEST 1 VIEW COMPARISON:  Aug 25, 2018 FINDINGS: A dual lead pacemaker is been placed in the interval. Leads. Overlie the right atrial appendage and right ventricle based on the frontal view. The heart, hila, mediastinum, lungs, and pleura are otherwise normal. No pneumothorax. IMPRESSION: Pacemaker, placed in the interval, appears to be in good position. No pneumothorax. No other acute abnormalities. Electronically Signed   By: Dorise Bullion III M.D   On: 08/26/2018 14:57   Dg C-arm 1-60 Min-no Report  Result Date: 08/26/2018 Fluoroscopy was utilized by the requesting physician.  No radiographic interpretation.     Medications:   . sodium chloride 250 mL (08/27/18 2306)  .  ceFAZolin (ANCEF) IV 2 g  (08/28/18 0650)   . chlorhexidine  15 mL Mouth Rinse BID  . Chlorhexidine Gluconate Cloth  6 each Topical Q0600  . clonazePAM  0.5 mg Oral BID  . famotidine  20 mg Oral QHS  . heparin  5,000 Units Subcutaneous Q8H  . mouth rinse  15 mL Mouth Rinse q12n4p  . sodium chloride flush  3 mL Intravenous Q12H   sodium chloride, acetaminophen **OR** acetaminophen, fentaNYL (SUBLIMAZE) injection, ondansetron **OR** ondansetron (ZOFRAN) IV, polyvinyl alcohol, sodium chloride flush  Assessment/ Plan:  58 y.o. male with hypertension, hyperlipidemia, anxiety, seizure disorder, gout, osteoarthritis, left bundle branch block who was admitted to [facility] on 08/24/2018 for nausea, dizziness and shortness of breath  1. Acute renal failure with hyperkalemia and metabolic acidosis: requiring emergent hemodialysis treatment with 1K bath in emergency department.  - UOP and potassium have improved - S Creatinine is improving - no further need for HD is anticipated - May remove dialysis catheter   3. Chronic Kidney Disease stage III: baseline creatinine of 1.65, GFR of 45 on 11/21/2017. Urine studies were bland.  Chronic Kidney Disease secondary to hypertension S Creatinine back to baseline Out patient follow up      LOS: [place] 6/3/20201:17 PM  [place], [place]  Note: This note was prepared with Dragon dictation. Any transcription errors are unintentional

## 2018-08-28 NOTE — Progress Notes (Signed)
[facility] [place] NAME: Edward Pitts    MR#:  [id number]  DATE OF BIRTH:  03-01-1961  SUBJECTIVE:  CHIEF COMPLAINT: Patient is doing fine.  Moved from the intensive care unit to telemetry on 08/28/2018 no complaints today  patient had PPM 6/1/  REVIEW OF SYSTEMS:  CONSTITUTIONAL: No fever, fatigue or weakness.  EYES: No blurred or double vision.  EARS, NOSE, AND THROAT: No tinnitus or ear pain.  RESPIRATORY: No cough, shortness of breath, wheezing or hemoptysis.  CARDIOVASCULAR: No chest pain, orthopnea, edema.  GASTROINTESTINAL: No nausea, vomiting, diarrhea or abdominal pain.  GENITOURINARY: No dysuria, hematuria.  ENDOCRINE: No polyuria, nocturia,  HEMATOLOGY: No anemia, easy bruising or bleeding SKIN: Right forearm blisters MUSCULOSKELETAL: No joint pain or arthritis.   NEUROLOGIC: No tingling, numbness, weakness.  PSYCHIATRY: No anxiety or depression.   DRUG ALLERGIES:   Allergies  Allergen Reactions  . Ibuprofen Other (See Comments)    Affects kidneys    VITALS:  Blood pressure 136/63, pulse 67, temperature 98.8 F (37.1 C), temperature source Oral, resp. rate 17, height 5\' 7"  (1.702 m), weight 101.6 kg, SpO2 94 %.  PHYSICAL EXAMINATION:  GENERAL:  58 y.o.-year-old patient lying in the bed with no acute distress.  EYES: Pupils equal, round, reactive to light and accommodation. No scleral icterus. Extraocular muscles intact.  HEENT: Head atraumatic, normocephalic. Oropharynx and nasopharynx clear.  NECK:  Supple, no jugular venous distention. No thyroid enlargement, no tenderness.  LUNGS: Normal breath sounds bilaterally, no wheezing, rales,rhonchi or crepitation. No use of accessory muscles of respiration.  CARDIOVASCULAR: S1, S2 normal, bradycardic no murmurs, rubs, or gallops.  Left anterior chest wall with subcutaneous pacemaker with clean dressing, tender to touch ABDOMEN: Soft, nontender, nondistended. Bowel  sounds present.  EXTREMITIES: Right forearm with blisters, no discharge no pedal edema, cyanosis, or clubbing.  NEUROLOGIC: Cranial nerves II through XII are intact. Muscle strength 5/5 in all extremities. Sensation intact. Gait not checked.  PSYCHIATRIC: The patient is alert and oriented x 3.  SKIN: No obvious rash, lesion, or ulcer.       LABORATORY PANEL:   CBC Recent Labs  Lab 08/27/18 0615  WBC 8.8  HGB 10.6*  HCT 32.8*  PLT 161   ------------------------------------------------------------------------------------------------------------------  Chemistries  Recent Labs  Lab 08/25/18 0936  08/28/18 0355  NA 140   < > 137  K 4.6   < > 4.7  CL 102   < > 104  CO2 27   < > 24  GLUCOSE 120*   < > 103*  BUN 46*   < > 22*  CREATININE 2.94*   < > 1.36*  CALCIUM 9.6   < > 8.9  MG 1.8   < > 1.9  AST 29  --   --   ALT 51*  --   --   ALKPHOS 70  --   --   BILITOT 0.9  --   --    < > = values in this interval not displayed.   ------------------------------------------------------------------------------------------------------------------  Cardiac Enzymes Recent Labs  Lab 08/25/18 2358  TROPONINI 0.05*   ------------------------------------------------------------------------------------------------------------------  RADIOLOGY:  No results found.  EKG:   Orders placed or performed during the hospital encounter of 08/24/18  . ED EKG  . ED EKG  . EKG 12-Lead  . EKG 12-Lead  . EKG 12-Lead in am (before 8am)  . EKG 12-Lead in am (before 8am)    ASSESSMENT AND PLAN:   #  Acute hyperkalemia Resolved Patient had urgent hemodialysis done on May 31 a.m.  Nephrology signed off okay to discontinue temporary dialysis catheter   #Severe bradycardia with third-degree heart block and 2 episodes of syncope Hold metoprolol  permanent pacemaker placed 08/26/2018   Continue close monitoring of the heart rate ontelemetry Appreciate Dr. Ubaldo Glassing recommendations.  Okay to  discontinue temporary pacemaker and ambulate the patient  #Acute kidney injury on chronic kidney disease stage III with hyperkalemia and metabolic acidosis Baseline creatinine 1.65.  Current creatinine 3.4-2.16-1.89--1.60 Had emergent hemodialysis Continue bicarb infusion and follow-up with nephrology  #Hypomagnesemia replete and recheck  #Sepsis with severe hypotension with  Shock-probably aspiration pneumonia  requiring pressors norepinephrine Lactic acid is elevated and procalcitonin at 0.31.  Pan cultures are obtained.    IV Unasyn started for aspiration pneumonia-changed to p.o. Augmentin  #Seizures-continue close monitoring Ativan as needed for breakthrough seizures  #Right forearm blisters probably secondary to the consistent pressure from the blood pressure cuff-not infected continue close monitoring   Plan of care discussed with intensivist  All the records are reviewed and case discussed with Care Management/Social Workerr. Management plans discussed with the patient at bedside and he is in in agreement.  CODE STATUS: fc  TOTAL TIME TAKING CARE OF THIS PATIENT: 32 minutes.   POSSIBLE D/C IN 1  DAYS, DEPENDING ON CLINICAL CONDITION.  Note: This dictation was prepared with Dragon dictation along with smaller phrase technology. Any transcriptional errors that result from this process are unintentional.   [name] on 08/28/2018 at 4:25 PM  Between 7am to 6pm - Pager - [phone] After 6pm go to [email or web address] - password [email or web address] [facility]  Office  [phone]  CC: Primary care physician; Petra Kuba, MD

## 2018-08-28 NOTE — Progress Notes (Signed)
Pharmacy Electrolyte Monitoring Consult:  Pharmacy consulted to assist in monitoring and replacing electrolytes in this 58 y.o. male admitted on 08/24/2018 with Bradycardia and Shortness of Breath   Labs:  Sodium (mmol/L)  Date Value  08/28/2018 137   Potassium (mmol/L)  Date Value  08/28/2018 4.7   Magnesium (mg/dL)  Date Value  08/28/2018 1.9   Phosphorus (mg/dL)  Date Value  08/26/2018 4.0   Calcium (mg/dL)  Date Value  08/28/2018 8.9   Albumin (g/dL)  Date Value  08/25/2018 3.6    Assessment/Plan: 1. No significant drug- drug interactions noted at this time.   2. No indication for electrolyte replacement today. Will continue to monitor with daily labs. Will replace for goal potassium ~ 4 and goal magnesium ~ 2.   Pharmacy will continue to monitor and adjust per consult.   Oswald Hillock, PharmD, BCPS 08/28/2018 7:44 AM

## 2018-08-28 NOTE — Progress Notes (Signed)
Right femoral trialysis catheter was removed per order, and per policy vaseline gauze with no complications. Sutures removed x 2; manual compression applied for 30 minutes, and dressing applied per policy. (vaseline gauze and DGD, secured with tape). Site without bleeding or hematoma. Patient advised to remain flat in bed for 30 minutes; pt was compliant. Pt advised to keep dressing in place for 24 hours.   Patient was then assisted to ambulate a short distance in hallway, and he tolerated fairly well. Groin site remains CDI. Pt up to chair to eat dinner. He will ambulate again tonight; plan for discharge in am.

## 2018-08-28 NOTE — Progress Notes (Signed)
Speech Therapy Note: reviewed chart notes; labs. Pt is currently on a regular diet w/ general aspiration precautions. He had ordered and finished breakfast upon meeting w/ him. He denied any difficulty w/ swallowing, or eating/drinking at meals. NSG/chart notes indicate no overt s/s of aspiration as well.  ST services will sign off at this time w/ NSG to reconsult if any decline in status while admitted. NSG agreed.    Orinda Kenner, [place], CCC-SLP

## 2018-08-29 MED ORDER — ACETAMINOPHEN 325 MG PO TABS: 650.0000 mg | ORAL_TABLET | Freq: Four times a day (QID) | ORAL | Status: DC | PRN

## 2018-08-29 MED ORDER — AMOXICILLIN-POT CLAVULANATE 875-125 MG PO TABS: 1.0000 | ORAL_TABLET | Freq: Two times a day (BID) | ORAL | 0 refills | Status: DC

## 2018-08-29 NOTE — Progress Notes (Signed)
Ch f/u with pt to see how well he was progressing. Pt shared that this was his first time having this experience with his heart. Pt shared he was preparing for his birthday party and was very mobile before being admitted to the hospital and was shocked at how fast things happened. Ch asked guided questioned to see how well pt had been responding to treatment as he mentioned that he did not remember much but was grateful for the care team acting so fast. Pt seemed to hv a bit of labored breathing but was comfortably sitting upright in recliner. Pt mentioned that his wife is currently having a procedure today so he will be going home in the care of his sister and mom. Ch provided words of encouragement upon exiting. Pt appreciated the visit.  No further needs as the pt is being d/c.    08/29/18 1100  Clinical Encounter Type  Visited With Patient  Visit Type Follow-up;Spiritual support;Social support  Spiritual Encounters  Spiritual Needs Emotional;Grief support  Stress Factors  Patient Stress Factors Health changes;Lack of caregivers;Loss of control;Major life changes  Family Stress Factors Health changes;Loss of control;Major life changes

## 2018-08-29 NOTE — Progress Notes (Signed)
Pharmacy Electrolyte Monitoring Consult:  Pharmacy consulted to assist in monitoring and replacing electrolytes in this 58 y.o. male admitted on 08/24/2018 with Bradycardia and Shortness of Breath   Labs:  Sodium (mmol/L)  Date Value  08/28/2018 137   Potassium (mmol/L)  Date Value  08/28/2018 4.7   Magnesium (mg/dL)  Date Value  08/28/2018 1.9   Phosphorus (mg/dL)  Date Value  08/26/2018 4.0   Calcium (mg/dL)  Date Value  08/28/2018 8.9   Albumin (g/dL)  Date Value  08/25/2018 3.6    Assessment/Plan: 1. No significant drug- drug interactions noted at this time.   2. No indication for electrolyte replacement today. Will continue to monitor with daily labs. Will replace for goal potassium ~ 4 and goal magnesium ~ 2.   Pharmacy will continue to monitor and adjust per consult.   Oswald Hillock, PharmD, BCPS 08/29/2018 7:39 AM

## 2018-08-29 NOTE — TOC Transition Note (Signed)
Transition of Care [facility]) - CM/SW Discharge Note   Patient Details  Name: Edward Pitts MRN: [id number] Date of Birth: November 29, 1960  [facility] CM/SW Contact:  Katrina Stack, RN Phone Number: 08/29/2018, 11:37 AM   Clinical Narrative:   For discharge today. No discharge needs identified by members of care team.  PPM insertion without any complications          Patient Goals and CMS Choice        Discharge Placement                       Discharge Plan and Services                                     Social Determinants of Health (SDOH) Interventions     Readmission Risk Interventions No flowsheet data found.

## 2018-08-29 NOTE — Discharge Summary (Signed)
[facility] NAME: Edward Pitts    MR#:  [id number]  DATE OF BIRTH:  1960/04/04  DATE OF ADMISSION:  08/24/2018 ADMITTING PHYSICIAN: Lance Coon, MD  DATE OF DISCHARGE:  08/29/18   PRIMARY CARE PHYSICIAN: Petra Kuba, MD    ADMISSION DIAGNOSIS:  Acute kidney failure, unspecified ([place]) [N17.9] Complete heart block ([place]) [I44.2] Hyperkalemia [E87.5] Acute renal failure, unspecified acute renal failure type ([place]) [N17.9]  DISCHARGE DIAGNOSIS:  Acute hyperkalemia Third-degree heart block Permanent pacemaker placement COVID negative  SECONDARY DIAGNOSIS:   Past Medical History:  Diagnosis Date  . Anxiety   . Hyperlipidemia   . Hypertension   . Seizures [facility])     HOSPITAL COURSE:  Edward Pitts  is a 58 y.o. male who presents with chief complaint as above.  Patient presents the ED after having 2 syncopal episodes at home today.  He denies any other symptoms except for some mild shortness of breath here he is found to be profoundly bradycardic.  On evaluation he is noted to be in complete heart block.  He is subsequently found to have acute kidney injury and severe hyperkalemia.  His potassium was 7.3.  Nephrology was contacted by ED physician and they recommended starting immediate potassium correcting medications, and that if the patient's potassium was not corrected quickly enough and his heart rate was not improving then dialysis could be performed.  Hospitalist were called for admission  #Acute hyperkalemia Resolved Patient had urgent hemodialysis done on May 31 a.m.  Nephrology signed off okay to discontinue temporary dialysis catheter  and discharge from their standpoint  #Severe bradycardia with third-degree heart block and 2 episodes of syncope Hold metoprolol  permanent pacemaker placed 08/26/2018   Continue close monitoring of the heart rate ontelemetry Appreciate Dr. Ubaldo Glassing recommendations.  Okay to  discontinue temporary pacemaker and ambulate the patient  #Acute kidney injury on chronic kidney disease stage III with hyperkalemia and metabolic acidosis Baseline creatinine 1.65.  Current creatinine 3.4-2.16-1.89--1.60-1.36 Had emergent hemodialysis Patient was initially placed on bicarb infusion and discontinued after clinical improvement.  Appreciate nephrology recommendations   #Hypomagnesemia repleted and measuring 2.1  #Sepsis with severe hypotension with  Shock-probably aspiration pneumonia  requiring pressors norepinephrine Lactic acid is elevated and procalcitonin at 0.31.  Pan cultures are obtained.    IV Unasyn started for aspiration pneumonia-changed to p.o. Augmentin  #Seizures-continue close monitoring Ativan as needed for breakthrough seizures  #Right forearm blisters probably secondary to the consistent pressure from the blood pressure cuff-not infected continue close monitoring, clinically improving    Discharge patient home     DISCHARGE CONDITIONS:   Stable   CONSULTS OBTAINED:  Treatment Team:  Teodoro Spray, MD   PROCEDURES permanent pacemaker placement  DRUG ALLERGIES:   Allergies  Allergen Reactions  . Ibuprofen Other (See Comments)    Affects kidneys    DISCHARGE MEDICATIONS:   Allergies as of 08/29/2018      Reactions   Ibuprofen Other (See Comments)   Affects kidneys      Medication List    STOP taking these medications   metoprolol succinate 25 MG 24 hr tablet Commonly known as:  TOPROL-XL     TAKE these medications   acetaminophen 325 MG tablet Commonly known as:  TYLENOL Take 2 tablets (650 mg total) by mouth every 6 (six) hours as needed for mild pain (or Fever >/= 101).   allopurinol 300 MG tablet Commonly known as:  ZYLOPRIM Take 300 mg by mouth daily.   amoxicillin-clavulanate 875-125 MG tablet Commonly known as:  AUGMENTIN Take 1 tablet by mouth every 12 (twelve) hours.   atorvastatin 20 MG  tablet Commonly known as:  LIPITOR Take 20 mg by mouth daily.   CENTRUM SILVER PO Take 1 tablet by mouth daily.   clonazePAM 0.5 MG tablet Commonly known as:  KLONOPIN 1 tablet in the morning, 2 in the evening   hydrocortisone 2.5 % cream Apply 1 application topically daily as needed (for itching).   ketoconazole 2 % cream Commonly known as:  NIZORAL Apply 1 application topically daily as needed for irritation.   lisinopril 20 MG tablet Commonly known as:  ZESTRIL Take 20 mg by mouth daily.        DISCHARGE INSTRUCTIONS:   Follow-up with primary care physician in 2 to 3 days Follow-up with cardiology Dr. Ubaldo Glassing in a week  DIET:  Cardiac diet  DISCHARGE CONDITION:  Stable  ACTIVITY:  Activity as tolerated  OXYGEN:  Home Oxygen: No.   Oxygen Delivery: room air  DISCHARGE LOCATION:  home   If you experience worsening of your admission symptoms, develop shortness of breath, life threatening emergency, suicidal or homicidal thoughts you must seek medical attention immediately by calling 911 or calling your MD immediately  if symptoms less severe.  You Must read complete instructions/literature along with all the possible adverse reactions/side effects for all the Medicines you take and that have been prescribed to you. Take any new Medicines after you have completely understood and accpet all the possible adverse reactions/side effects.   Please note  You were cared for by a hospitalist during your hospital stay. If you have any questions about your discharge medications or the care you received while you were in the hospital after you are discharged, you can call the unit and asked to speak with the hospitalist on call if the hospitalist that took care of you is not available. Once you are discharged, your primary care physician will handle any further medical issues. Please note that NO REFILLS for any discharge medications will be authorized once you are discharged,  as it is imperative that you return to your primary care physician (or establish a relationship with a primary care physician if you do not have one) for your aftercare needs so that they can reassess your need for medications and monitor your lab values.     Today  Chief Complaint  Patient presents with  . Bradycardia  . Shortness of Breath   Patient is doing fine.  He denies any chest pain or shortness of breath and ambulating in the hallway without any difficulty.  Okay to discharge patient from cardiology standpoint  ROS:  CONSTITUTIONAL: Denies fevers, chills. Denies any fatigue, weakness.  EYES: Denies blurry vision, double vision, eye pain. EARS, NOSE, THROAT: Denies tinnitus, ear pain, hearing loss. RESPIRATORY: Denies cough, wheeze, shortness of breath.  CARDIOVASCULAR: Denies chest pain, palpitations, edema.  GASTROINTESTINAL: Denies nausea, vomiting, diarrhea, abdominal pain. Denies bright red blood per rectum. GENITOURINARY: Denies dysuria, hematuria. ENDOCRINE: Denies nocturia or thyroid problems. HEMATOLOGIC AND LYMPHATIC: Denies easy bruising or bleeding. SKIN: Denies rash or lesion. MUSCULOSKELETAL: Denies pain in neck, back, shoulder, knees, hips or arthritic symptoms.  NEUROLOGIC: Denies paralysis, paresthesias.  PSYCHIATRIC: Denies anxiety or depressive symptoms.   VITAL SIGNS:  Blood pressure (!) 147/75, pulse 81, temperature 98.6 F (37 C), temperature source Oral, resp. rate 19, height 5\' 7"  (1.702 m), weight 101.6  kg, SpO2 96 %.  I/O:    Intake/Output Summary (Last 24 hours) at 08/29/2018 1334 Last data filed at 08/29/2018 0900 Gross per 24 hour  Intake 240 ml  Output 525 ml  Net -285 ml    PHYSICAL EXAMINATION:  GENERAL:  58 y.o.-year-old patient lying in the bed with no acute distress.  EYES: Pupils equal, round, reactive to light and accommodation. No scleral icterus. Extraocular muscles intact.  HEENT: Head atraumatic, normocephalic. Oropharynx  and nasopharynx clear.  NECK:  Supple, no jugular venous distention. No thyroid enlargement, no tenderness.  LUNGS: Normal breath sounds bilaterally, no wheezing, rales,rhonchi or crepitation. No use of accessory muscles of respiration.  CARDIOVASCULAR: S1, S2 normal. No murmurs, rubs, or gallops.  Anterior chest wall with a subcutaneous pacemaker, healing well ABDOMEN: Soft, non-tender, non-distended. Bowel sounds present. EXTREMITIES: No pedal edema, cyanosis, or clubbing.  NEUROLOGIC: Cranial nerves II through XII are intact. Muscle strength 5/5 in all extremities. Sensation intact. Gait not checked.  PSYCHIATRIC: The patient is alert and oriented x 3.  SKIN: No obvious rash, lesion, or ulcer.   DATA REVIEW:   CBC Recent Labs  Lab 08/27/18 0615  WBC 8.8  HGB 10.6*  HCT 32.8*  PLT 161    Chemistries  Recent Labs  Lab 08/25/18 0936  08/28/18 0355  NA 140   < > 137  K 4.6   < > 4.7  CL 102   < > 104  CO2 27   < > 24  GLUCOSE 120*   < > 103*  BUN 46*   < > 22*  CREATININE 2.94*   < > 1.36*  CALCIUM 9.6   < > 8.9  MG 1.8   < > 1.9  AST 29  --   --   ALT 51*  --   --   ALKPHOS 70  --   --   BILITOT 0.9  --   --    < > = values in this interval not displayed.    Cardiac Enzymes Recent Labs  Lab 08/25/18 2358  TROPONINI 0.05*    Microbiology Results  Results for orders placed or performed during the hospital encounter of 08/24/18  SARS Coronavirus 2 (CEPHEID - Performed in [place] hospital lab), Hosp Order     Status: None   Collection Time: 08/24/18  8:42 PM  Result Value Ref Range Status   SARS Coronavirus 2 NEGATIVE NEGATIVE Final    Comment: (NOTE) If result is NEGATIVE SARS-CoV-2 target nucleic acids are NOT DETECTED. The SARS-CoV-2 RNA is generally detectable in upper and lower  respiratory specimens during the acute phase of infection. The lowest  concentration of SARS-CoV-2 viral copies this assay can detect is 250  copies / mL. A negative result  does not preclude SARS-CoV-2 infection  and should not be used as the sole basis for treatment or other  patient management decisions.  A negative result may occur with  improper specimen collection / handling, submission of specimen other  than nasopharyngeal swab, presence of viral mutation(s) within the  areas targeted by this assay, and inadequate number of viral copies  (<250 copies / mL). A negative result must be combined with clinical  observations, patient history, and epidemiological information. If result is POSITIVE SARS-CoV-2 target nucleic acids are DETECTED. The SARS-CoV-2 RNA is generally detectable in upper and lower  respiratory specimens dur ing the acute phase of infection.  Positive  results are indicative of active infection with SARS-CoV-2.  Clinical  correlation with patient history and other diagnostic information is  necessary to determine patient infection status.  Positive results do  not rule out bacterial infection or co-infection with other viruses. If result is PRESUMPTIVE POSTIVE SARS-CoV-2 nucleic acids MAY BE PRESENT.   A presumptive positive result was obtained on the submitted specimen  and confirmed on repeat testing.  While 2019 novel coronavirus  (SARS-CoV-2) nucleic acids may be present in the submitted sample  additional confirmatory testing may be necessary for epidemiological  and / or clinical management purposes  to differentiate between  SARS-CoV-2 and other Sarbecovirus currently known to infect humans.  If clinically indicated additional testing with an alternate test  methodology [phone]) is advised. The SARS-CoV-2 RNA is generally  detectable in upper and lower respiratory sp ecimens during the acute  phase of infection. The expected result is Negative. Fact Sheet for Patients:  StrictlyIdeas.no Fact Sheet for Healthcare Providers: BankingDealers.co.za This test is not yet approved or  cleared by the [place] FDA and has been authorized for detection and/or diagnosis of SARS-CoV-2 by FDA under an Emergency Use Authorization (EUA).  This EUA will remain in effect (meaning this test can be used) for the duration of the COVID-19 declaration under Section 564(b)(1) of the Act, 21 U.S.C. section 360bbb-3(b)(1), unless the authorization is terminated or revoked sooner. Performed at [facility], [address] [place], [place] [address]   MRSA PCR Screening     Status: None   Collection Time: 08/25/18  8:09 AM  Result Value Ref Range Status   MRSA by PCR NEGATIVE NEGATIVE Final    Comment:        The GeneXpert MRSA Assay (FDA approved for NASAL specimens only), is one component of a comprehensive MRSA colonization surveillance program. It is not intended to diagnose MRSA infection nor to guide or monitor treatment for MRSA infections. Performed at [facility], [address] [place], [place] [address]   CULTURE, BLOOD (ROUTINE X 2) w Reflex to ID Panel     Status: None (Preliminary result)   Collection Time: 08/25/18 11:18 AM  Result Value Ref Range Status   Specimen Description BLOOD LEFT ANTECUBITAL  Final   Special Requests   Final    BOTTLES DRAWN AEROBIC AND ANAEROBIC Blood Culture adequate volume   Culture   Final    NO GROWTH 4 DAYS Performed at [facility], [address], [place], [place] [address]    Report Status PENDING  Incomplete  CULTURE, BLOOD (ROUTINE X 2) w Reflex to ID Panel     Status: None (Preliminary result)   Collection Time: 08/25/18 11:27 AM  Result Value Ref Range Status   Specimen Description BLOOD BLOOD LEFT HAND  Final   Special Requests   Final    BOTTLES DRAWN AEROBIC AND ANAEROBIC Blood Culture adequate volume   Culture   Final    NO GROWTH 4 DAYS Performed at [facility], [address] [place], [place] [address]    Report Status PENDING  Incomplete    RADIOLOGY:  Dg  Chest Port 1 View  Result Date: 08/26/2018 CLINICAL DATA:  Post cardiac pacemaker placement EXAM: PORTABLE CHEST 1 VIEW COMPARISON:  Aug 25, 2018 FINDINGS: A dual lead pacemaker is been placed in the interval. Leads. Overlie the right atrial appendage and right ventricle based on the frontal view. The heart, hila, mediastinum, lungs, and pleura are otherwise normal. No pneumothorax. IMPRESSION: Pacemaker, placed in the interval, appears  to be in good position. No pneumothorax. No other acute abnormalities. Electronically Signed   By: Dorise Bullion III M.D   On: 08/26/2018 14:57   Dg Chest Port 1 View  Result Date: 08/25/2018 CLINICAL DATA:  Temporary cardiac pacer EXAM: PORTABLE CHEST 1 VIEW COMPARISON:  08/25/2018 FINDINGS: Temporary pacer from below with tip in the region of the right ventricle. It is difficult to visualize the entire course of the catheter due to soft tissue attenuation and overlapping EKG leads. Normal heart size. There is no edema, consolidation, effusion, or pneumothorax. IMPRESSION: 1. No evidence of active disease. 2. Temporary pacer lead projects over the right ventricle. Electronically Signed   By: Monte Fantasia M.D.   On: 08/25/2018 22:02   Dg C-arm 1-60 Min-no Report  Result Date: 08/26/2018 Fluoroscopy was utilized by the requesting physician.  No radiographic interpretation.    EKG:   Orders placed or performed during the hospital encounter of 08/24/18  . ED EKG  . ED EKG  . EKG 12-Lead  . EKG 12-Lead  . EKG 12-Lead in am (before 8am)  . EKG 12-Lead in am (before 8am)      Management plans discussed with the patient, he is  in agreement.  CODE STATUS:     Code Status Orders  (From admission, onward)         Start     Ordered   08/25/18 0809  Full code  Continuous     08/25/18 0808        Code Status History    Date Active Date Inactive Code Status Order ID Comments User Context   06/29/2017 0903 06/29/2017 1320 Full Code [id number]  [name], MD Inpatient      TOTAL TIME TAKING CARE OF THIS PATIENT: 45  minutes.   Note: This dictation was prepared with Dragon dictation along with smaller phrase technology. Any transcriptional errors that result from this process are unintentional.   @[facility] @  on 08/29/2018 at 1:34 PM  Between 7am to 6pm - Pager - [phone]  After 6pm go to [email or web address] - password [email or web address] [facility]  Office  [phone]  CC: Primary care physician; Petra Kuba, MD

## 2018-08-29 NOTE — Progress Notes (Signed)
Went over discharge instructions with patient including medications and follow-up appointment. Vernie Shanks, RN discontinue peripheral IV and telemetry monitor. Patient is waiting for his sister for his ride.

## 2018-08-29 NOTE — Plan of Care (Signed)
  Problem: Health Behavior/Discharge Planning: Goal: Ability to manage health-related needs will improve Outcome: Progressing   Problem: Activity: Goal: Activity intolerance will improve Outcome: Progressing   Problem: Health Behavior/Discharge Planning: Goal: Ability to manage health-related needs will improve Outcome: Progressing   Problem: Clinical Measurements: Goal: Will remain free from infection Outcome: Progressing   Problem: Activity: Goal: Risk for activity intolerance will decrease Outcome: Progressing   Problem: Elimination: Goal: Will not experience complications related to bowel motility Outcome: Progressing

## 2018-08-29 NOTE — Discharge Instructions (Signed)
Follow-up with primary care physician in 2 to 3 days Follow-up with cardiology Dr. Ubaldo Glassing in a week

## 2018-08-29 NOTE — Progress Notes (Signed)
Ch f/u with pt to see how well he was progressing. Pt shared that he felt good about ging home.  No f/u at this time.

## 2018-08-30 ENCOUNTER — Other Ambulatory Visit (HOSPITAL_COMMUNITY): Payer: Self-pay | Admitting: Student

## 2018-08-30 ENCOUNTER — Ambulatory Visit

## 2018-08-30 DIAGNOSIS — M1711 Unilateral primary osteoarthritis, right knee: Secondary | ICD-10-CM

## 2018-08-30 DIAGNOSIS — M7041 Prepatellar bursitis, right knee: Secondary | ICD-10-CM

## 2018-08-30 DIAGNOSIS — M2341 Loose body in knee, right knee: Secondary | ICD-10-CM

## 2018-08-30 LAB — CULTURE, BLOOD (ROUTINE X 2)
Culture: NO GROWTH
Culture: NO GROWTH
Special Requests: ADEQUATE
Special Requests: ADEQUATE

## 2018-09-03 ENCOUNTER — Ambulatory Visit

## 2018-12-11 DIAGNOSIS — D631 Anemia in chronic kidney disease: Secondary | ICD-10-CM | POA: Insufficient documentation

## 2018-12-11 DIAGNOSIS — I129 Hypertensive chronic kidney disease with stage 1 through stage 4 chronic kidney disease, or unspecified chronic kidney disease: Secondary | ICD-10-CM | POA: Insufficient documentation

## 2018-12-11 DIAGNOSIS — N1831 Chronic kidney disease, stage 3a: Secondary | ICD-10-CM | POA: Insufficient documentation

## 2018-12-11 DIAGNOSIS — D649 Anemia, unspecified: Secondary | ICD-10-CM | POA: Insufficient documentation

## 2018-12-11 DIAGNOSIS — N189 Chronic kidney disease, unspecified: Secondary | ICD-10-CM | POA: Insufficient documentation

## 2018-12-16 ENCOUNTER — Telehealth: Payer: Self-pay | Admitting: Neurology

## 2018-12-16 ENCOUNTER — Other Ambulatory Visit: Payer: Self-pay | Admitting: Neurology

## 2018-12-16 MED ORDER — CLONAZEPAM 0.5 MG PO TABS: ORAL_TABLET | ORAL | 3 refills | Status: DC

## 2018-12-16 NOTE — Telephone Encounter (Signed)
The clonazepam will be refilled. 

## 2018-12-16 NOTE — Telephone Encounter (Signed)
[place] tracts has been verified. Last refill was 10/24/2018 # 90 for a 30 day supply.

## 2018-12-16 NOTE — Telephone Encounter (Signed)
Pt is needing a refill on his clonazePAM (KLONOPIN) 0.5 MG tablet sent to the [facility] on [address]

## 2018-12-17 ENCOUNTER — Ambulatory Visit (INDEPENDENT_AMBULATORY_CARE_PROVIDER_SITE_OTHER): Payer: BC Managed Care – PPO | Admitting: Neurology

## 2018-12-17 ENCOUNTER — Other Ambulatory Visit: Payer: Self-pay

## 2018-12-17 ENCOUNTER — Encounter: Payer: Self-pay | Admitting: Neurology

## 2018-12-17 ENCOUNTER — Encounter

## 2018-12-17 DIAGNOSIS — R55 Syncope and collapse: Secondary | ICD-10-CM | POA: Diagnosis not present

## 2018-12-17 NOTE — Progress Notes (Signed)
Reason for visit: Near syncope  Nolberto Mcdade is an 58 y.o. male  History of present illness:  Mr. Butsch is a 58 year old right-handed white male with a history of episodes of near syncope.  The patient was admitted to the hospital on 24 Aug 2018 with complete heart block and acute renal failure with hyperkalemia.  The patient required emergency dialysis and a pacemaker.  Since that time, he has recovered quite well, he is back to work now.  He is still having some episodes of dizziness, but if he takes clonazepam, this will improve the events.  He usually only takes one 0.5 mg clonazepam tablet a day.  He has not had any further near syncopal episodes.  The patient returns to the office today for an evaluation.  Past Medical History:  Diagnosis Date  . Anxiety   . Hyperlipidemia   . Hypertension   . Seizures ([place])     Past Surgical History:  Procedure Laterality Date  . KNEE SURGERY Left   . LEFT HEART CATH AND CORONARY ANGIOGRAPHY Left 06/29/2017   Procedure: LEFT HEART CATH AND CORONARY ANGIOGRAPHY;  Surgeon: [name], MD;  Location: [facility];  Service: Cardiovascular;  Laterality: Left;  . PACEMAKER INSERTION N/A 08/26/2018   Procedure: INSERTION PACEMAKER;  Surgeon: [name], MD;  Location: [facility];  Service: Cardiovascular;  Laterality: N/A;  . TEMPORARY PACEMAKER N/A 08/25/2018   Procedure: TEMPORARY PACEMAKER;  Surgeon: [name], MD;  Location: [facility];  Service: Cardiovascular;  Laterality: N/A;    History reviewed. No pertinent family history.  Social history:  reports that he has never smoked. He has never used smokeless tobacco. He reports that he does not drink alcohol or use drugs.    Allergies  Allergen Reactions  . Ibuprofen Other (See Comments)    Affects kidneys    Medications:  Prior to Admission medications   Medication Sig Start Date End Date Taking? Authorizing Provider  allopurinol (ZYLOPRIM) 300 MG  tablet Take 300 mg by mouth daily. 06/12/18   [provider]  atorvastatin (LIPITOR) 20 MG tablet Take 20 mg by mouth daily. 07/15/18   [provider]  clonazePAM Bobbye [place]) 0.5 MG tablet 1 tablet in the morning, 2 in the evening 12/16/18   Kathrynn Ducking, MD  hydrocortisone 2.5 % cream Apply 1 application topically daily as needed (for itching).     [provider]  ketoconazole (NIZORAL) 2 % cream Apply 1 application topically daily as needed for irritation.     [provider]  lisinopril (ZESTRIL) 20 MG tablet Take 20 mg by mouth daily.    [provider]  Multiple Vitamins-Minerals (CENTRUM SILVER PO) Take 1 tablet by mouth daily.    [provider]    ROS:  Out of a complete 14 system review of symptoms, the patient complains only of the following symptoms, and all other reviewed systems are negative.  Dizziness  Blood pressure 136/64, pulse 73, temperature (!) 97.3 F (36.3 C), temperature source Temporal, height 5\' 7"  (1.702 m), weight 228 lb 5 oz (103.6 kg), SpO2 94 %.  Physical Exam  General: The patient is alert and cooperative at the time of the examination.  The patient is moderately obese.  Skin: No significant peripheral edema is noted.   Neurologic Exam  Mental status: The patient is alert and oriented x 3 at the time of the examination. The patient has apparent normal recent and remote memory,  with an apparently normal attention span and concentration ability.   Cranial nerves: Facial symmetry is present. Speech is normal, no aphasia or dysarthria is noted. Extraocular movements are full. Visual fields are full.  Motor: The patient has good strength in all 4 extremities.  Sensory examination: Soft touch sensation is symmetric on the face, arms, and legs.  Coordination: The patient has good finger-nose-finger and heel-to-shin bilaterally.  Gait and station: The patient has a slightly wide-based gait. Tandem  gait is slightly unsteady. Romberg is negative. No drift is seen.  Reflexes: Deep tendon reflexes are symmetric.   Assessment/Plan:  1.  History of near syncope, status post cardiac pacemaker placement  2.  Anxiety  The patient is doing well currently, he will take clonazepam if needed.  He appears to be stable now with his medical condition.  He will follow-up here in 1 year, sooner if needed.  Jill Alexanders MD 12/17/2018 9:55 AM  [facility] [address] [place], [place] [address]  Phone [phone] Fax [phone]

## 2018-12-30 ENCOUNTER — Emergency Department
Admission: EM | Admit: 2018-12-30 | Discharge: 2018-12-30 | Disposition: A | Discharge status: Home / Self Care | Payer: BC Managed Care – PPO | Attending: Emergency Medicine | Admitting: Emergency Medicine

## 2018-12-30 ENCOUNTER — Other Ambulatory Visit: Payer: Self-pay

## 2018-12-30 ENCOUNTER — Encounter: Payer: Self-pay | Admitting: Emergency Medicine

## 2018-12-30 DIAGNOSIS — I10 Essential (primary) hypertension: Secondary | ICD-10-CM | POA: Insufficient documentation

## 2018-12-30 DIAGNOSIS — Z79899 Other long term (current) drug therapy: Secondary | ICD-10-CM | POA: Insufficient documentation

## 2018-12-30 DIAGNOSIS — J069 Acute upper respiratory infection, unspecified: Secondary | ICD-10-CM | POA: Diagnosis not present

## 2018-12-30 DIAGNOSIS — Z950 Presence of cardiac pacemaker: Secondary | ICD-10-CM | POA: Diagnosis not present

## 2018-12-30 DIAGNOSIS — Z20828 Contact with and (suspected) exposure to other viral communicable diseases: Secondary | ICD-10-CM | POA: Insufficient documentation

## 2018-12-30 DIAGNOSIS — R05 Cough: Secondary | ICD-10-CM | POA: Diagnosis present

## 2018-12-30 LAB — SARS CORONAVIRUS 2 (TAT 6-24 HRS): SARS Coronavirus 2: NEGATIVE

## 2018-12-30 MED ORDER — PSEUDOEPH-BROMPHEN-DM 30-2-10 MG/5ML PO SYRP: 5.0000 mL | ORAL_SOLUTION | Freq: Four times a day (QID) | ORAL | 0 refills | Status: DC | PRN

## 2018-12-30 NOTE — ED Notes (Addendum)
See triage note  Presents with cough and sore throat  States sx's started after the flu shot last week.   Afebrile on arrival and denied fever at home

## 2018-12-30 NOTE — ED Provider Notes (Signed)
[facility] Emergency Department Provider Note   ____________________________________________   First MD Initiated Contact with Patient 12/30/18 [phone]     (approximate)  I have reviewed the triage vital signs and the nursing notes.   HISTORY  Chief Complaint Cough and Sore Throat   HPI Edward Pitts is a 58 y.o. male presents to the ED with complaint of cough and sore throat that started last week.  Patient states that he got a flu shot and after that began having coughing, sneezing, sore throat.  He is unaware of any fever and denies any taste or smell changes.  He is unaware of any exposure to COVID.  He states that this morning his boss wants him checked out and that he does not have COVID.  Rates his pain as a 2 out of 10.       Past Medical History:  Diagnosis Date  . Anxiety   . Hyperlipidemia   . Hypertension   . Seizures [facility])     Patient Active Problem List   Diagnosis Date Noted  . Near syncope 12/17/2018  . AKI (acute kidney injury) ([place]) 08/24/2018  . Acute hyperkalemia 08/24/2018  . HTN (hypertension) 08/24/2018  . HLD (hyperlipidemia) 08/24/2018  . Seizures ([place]) 08/24/2018  . Complete heart block ([place]) 08/24/2018  . Stable angina ([place]) 06/27/2017    Past Surgical History:  Procedure Laterality Date  . KNEE SURGERY Left   . LEFT HEART CATH AND CORONARY ANGIOGRAPHY Left 06/29/2017   Procedure: LEFT HEART CATH AND CORONARY ANGIOGRAPHY;  Surgeon: [name], MD;  Location: [place] [facility];  Service: Cardiovascular;  Laterality: Left;  . PACEMAKER INSERTION N/A 08/26/2018   Procedure: INSERTION PACEMAKER;  Surgeon: [name], MD;  Location: [facility] ORS;  Service: Cardiovascular;  Laterality: N/A;  . TEMPORARY PACEMAKER N/A 08/25/2018   Procedure: TEMPORARY PACEMAKER;  Surgeon: [name], MD;  Location: [place] [facility];  Service: Cardiovascular;  Laterality: N/A;    Prior to Admission medications    Medication Sig Start Date End Date Taking? Authorizing Provider  allopurinol (ZYLOPRIM) 300 MG tablet Take 300 mg by mouth daily. 06/12/18   [provider]  atorvastatin (LIPITOR) 20 MG tablet Take 20 mg by mouth daily. 07/15/18   [provider]  brompheniramine-pseudoephedrine-DM 30-2-10 MG/5ML syrup Take 5 mLs by mouth 4 (four) times daily as needed. 12/30/18   Johnn Hai, PA-C  clonazePAM Bobbye [place]) 0.5 MG tablet 1 tablet in the morning, 2 in the evening 12/16/18   Kathrynn Ducking, MD  hydrocortisone 2.5 % cream Apply 1 application topically daily as needed (for itching).     [provider]  ketoconazole (NIZORAL) 2 % cream Apply 1 application topically daily as needed for irritation.     [provider]  lisinopril (ZESTRIL) 20 MG tablet Take 20 mg by mouth daily.    [provider]  Multiple Vitamins-Minerals (CENTRUM SILVER PO) Take 1 tablet by mouth daily.    [provider]    Allergies Ibuprofen  No family history on file.  Social History Social History   Tobacco Use  . Smoking status: Never Smoker  . Smokeless tobacco: Never Used  Substance Use Topics  . Alcohol use: No  . Drug use: No    Review of Systems Constitutional: No fever/chills Eyes: No visual changes. ENT: Positive sneezing.  Positive sore throat. Cardiovascular: Denies chest pain. Respiratory: Denies shortness of breath.  Positive cough. Gastrointestinal: No abdominal pain.  No nausea, no vomiting.  Musculoskeletal: Negative for muscle aches. Skin: Negative for rash. Neurological: Negative for headaches, focal weakness or numbness. ____________________________________________   PHYSICAL EXAM:  VITAL SIGNS: ED Triage Vitals  Enc Vitals Group     BP 12/30/18 0817 (!) 160/61     Pulse Rate 12/30/18 0817 85     Resp 12/30/18 0817 20     Temp 12/30/18 0817 98.3 F (36.8 C)     Temp Source 12/30/18 0817 Oral     SpO2 12/30/18 0817 97 %      Weight 12/30/18 0818 225 lb (102.1 kg)     Height 12/30/18 0818 5\' 7"  (1.702 m)     Head Circumference --      Peak Flow --      Pain Score 12/30/18 0818 2     Pain Loc --      Pain Edu? --      Excl. in [place]? --     Constitutional: Alert and oriented. Well appearing and in no acute distress. Eyes: Conjunctivae are normal. Head: Atraumatic. Nose: No congestion/rhinnorhea.  EACs and TMs are clear bilaterally. Neck: No stridor.   Hematological/Lymphatic/Immunilogical: No cervical lymphadenopathy. Cardiovascular: Normal rate, regular rhythm. Grossly normal heart sounds.  Good peripheral circulation. Respiratory: Normal respiratory effort.  No retractions. Lungs CTAB. Gastrointestinal: Soft and nontender. No distention. Musculoskeletal: Moves upper and lower extremities without any difficulty.  Normal gait was noted. Neurologic:  Normal speech and language. No gross focal neurologic deficits are appreciated. No gait instability. Skin:  Skin is warm, dry and intact. No rash noted. Psychiatric: Mood and affect are normal. Speech and behavior are normal.  ____________________________________________   LABS (all labs ordered are listed, but only abnormal results are displayed)  Labs Reviewed  SARS CORONAVIRUS 2 (TAT 6-24 HRS)    PROCEDURES  Procedure(s) performed (including Critical Care):  Procedures   ____________________________________________   INITIAL IMPRESSION / ASSESSMENT AND PLAN / ED COURSE  As part of my medical decision making, I reviewed the following data within the electronic MEDICAL RECORD NUMBER Notes from prior ED visits and [place] Controlled Substance Database  58 year old male presents to the ED with complaint of cough, body aches and sore throat.  Patient states that he had a flu shot last week and began having symptoms shortly after that time.  He went to work today and his boss sent him "to be checked out".  Patient states that his boss is worried that he has  COVID.  He denies any known exposure to COVID.  A test was sent and patient is aware that he will be quarantined until the results of that test have been received.  Physical exam is benign.  Patient was given a prescription for Bromfed-DM as needed for cough and congestion.  He is to increase fluids.  Tylenol if needed for fever.  ____________________________________________   FINAL CLINICAL IMPRESSION(S) / ED DIAGNOSES  Final diagnoses:  Viral URI with cough     ED Discharge Orders         Ordered    brompheniramine-pseudoephedrine-DM 30-2-10 MG/5ML syrup  4 times daily PRN     12/30/18 0929           Note:  This document was prepared using Dragon voice recognition software and may include unintentional dictation errors.    Johnn Hai, PA-C 12/30/18 [id number]    Blake Divine, MD 12/30/18 [phone]

## 2018-12-30 NOTE — ED Triage Notes (Signed)
Pt reports he got the flu shot last Tuesday and then he started coughing and sneezing and his throat started hurting. Pt reports he thinks it is side effects from the flu shot but his boss wanted him to get checked out.

## 2018-12-30 NOTE — Discharge Instructions (Signed)
Follow up with your doctor if any continued problems.  Increase fluids.  Bromfed-DM as needed for cough, congestion and runny nose.  You may take Tylenol if needed for body aches or fever.  You and your family are quarantined until you have received the results of your COVID test.  If it is positive you will need an additional 10 days out of work.

## 2019-01-31 ENCOUNTER — Emergency Department
Admission: EM | Admit: 2019-01-31 | Discharge: 2019-01-31 | Disposition: A | Discharge status: Home / Self Care | Payer: BC Managed Care – PPO | Attending: Emergency Medicine | Admitting: Emergency Medicine

## 2019-01-31 ENCOUNTER — Emergency Department: Payer: BC Managed Care – PPO

## 2019-01-31 ENCOUNTER — Encounter: Payer: Self-pay | Admitting: Emergency Medicine

## 2019-01-31 ENCOUNTER — Other Ambulatory Visit: Payer: Self-pay

## 2019-01-31 DIAGNOSIS — R1909 Other intra-abdominal and pelvic swelling, mass and lump: Secondary | ICD-10-CM | POA: Diagnosis not present

## 2019-01-31 DIAGNOSIS — Z7982 Long term (current) use of aspirin: Secondary | ICD-10-CM | POA: Diagnosis not present

## 2019-01-31 DIAGNOSIS — Z79899 Other long term (current) drug therapy: Secondary | ICD-10-CM | POA: Diagnosis not present

## 2019-01-31 DIAGNOSIS — Z950 Presence of cardiac pacemaker: Secondary | ICD-10-CM | POA: Insufficient documentation

## 2019-01-31 DIAGNOSIS — R2242 Localized swelling, mass and lump, left lower limb: Secondary | ICD-10-CM | POA: Diagnosis not present

## 2019-01-31 DIAGNOSIS — I10 Essential (primary) hypertension: Secondary | ICD-10-CM | POA: Insufficient documentation

## 2019-01-31 DIAGNOSIS — R222 Localized swelling, mass and lump, trunk: Secondary | ICD-10-CM | POA: Diagnosis present

## 2019-01-31 LAB — CBC WITH DIFFERENTIAL/PLATELET
Abs Immature Granulocytes: 0.09 10*3/uL — ABNORMAL HIGH (ref 0.00–0.07)
Basophils Absolute: 0 10*3/uL (ref 0.0–0.1)
Basophils Relative: 0 %
Eosinophils Absolute: 0.2 10*3/uL (ref 0.0–0.5)
Eosinophils Relative: 4 %
HCT: 30.9 % — ABNORMAL LOW (ref 39.0–52.0)
Hemoglobin: 10.4 g/dL — ABNORMAL LOW (ref 13.0–17.0)
Immature Granulocytes: 2 %
Lymphocytes Relative: 21 %
Lymphs Abs: 1 10*3/uL (ref 0.7–4.0)
MCH: 28.8 pg (ref 26.0–34.0)
MCHC: 33.7 g/dL (ref 30.0–36.0)
MCV: 85.6 fL (ref 80.0–100.0)
Monocytes Absolute: 0.5 10*3/uL (ref 0.1–1.0)
Monocytes Relative: 10 %
Neutro Abs: 3.1 10*3/uL (ref 1.7–7.7)
Neutrophils Relative %: 63 %
Platelets: 232 10*3/uL (ref 150–400)
RBC: 3.61 MIL/uL — ABNORMAL LOW (ref 4.22–5.81)
RDW: 13.2 % (ref 11.5–15.5)
WBC: 4.9 10*3/uL (ref 4.0–10.5)
nRBC: 0 % (ref 0.0–0.2)

## 2019-01-31 LAB — BASIC METABOLIC PANEL
Anion gap: 8 (ref 5–15)
BUN: 24 mg/dL — ABNORMAL HIGH (ref 6–20)
CO2: 25 mmol/L (ref 22–32)
Calcium: 9.1 mg/dL (ref 8.9–10.3)
Chloride: 106 mmol/L (ref 98–111)
Creatinine, Ser: 1.16 mg/dL (ref 0.61–1.24)
GFR calc Af Amer: >60 mL/min (ref >60)
GFR calc non Af Amer: >60 mL/min (ref >60)
Glucose, Bld: 85 mg/dL (ref 70–99)
Potassium: 4.5 mmol/L (ref 3.5–5.1)
Sodium: 139 mmol/L (ref 135–145)

## 2019-01-31 LAB — PROTIME-INR
INR: 1 (ref 0.8–1.2)
Prothrombin Time: 13.2 seconds (ref 11.4–15.2)

## 2019-01-31 MED ORDER — LIDOCAINE 5 % EX PTCH
1.0000 | MEDICATED_PATCH | CUTANEOUS | Status: DC
  Administered 2019-01-31: 12:00:00 1 via TRANSDERMAL
  Filled 2019-01-31: qty 1

## 2019-01-31 MED ORDER — IOHEXOL 350 MG/ML SOLN
100.0000 mL | Freq: Once | INTRAVENOUS | Status: AC | PRN
  Administered 2019-01-31: 10:00:00 100 mL via INTRAVENOUS
  Filled 2019-01-31: qty 100

## 2019-01-31 MED ORDER — LIDOCAINE 5 % EX PTCH: 1.0000 | MEDICATED_PATCH | Freq: Two times a day (BID) | CUTANEOUS | 0 refills | Status: DC

## 2019-01-31 NOTE — Discharge Instructions (Addendum)
Advised supportive care until evaluation by Dr. Tasia Catchings.  Use Lidoderm patches every 12 hours as directed.  Call clinic today at 2.00 PM for appointment time.

## 2019-01-31 NOTE — ED Notes (Signed)
See triage note  Presents with intermittent pain to left groin area   States he had an angiogram in June  Pain has been intermittent since  Denies any recent injury  Ambulates well

## 2019-01-31 NOTE — ED Provider Notes (Signed)
[facility] Emergency Department Provider Note   ____________________________________________   First MD Initiated Contact with Patient 01/31/19 [phone]     (approximate)  I have reviewed the triage vital signs and the nursing notes.   HISTORY  Chief Complaint Leg Pain    HPI Edward Pitts is a 58 y.o. male patient presents with left inguinal mass for approximately 4 months.  Patient state increasing left inguinal mass status post angiogram in June.  Patient states he saw his PCP and they stated they were going to do an ultrasound but he has not heard from them in the last 3 days.  Patient did awaken with increased pain this morning.  Patient states pain increased with squatting or flexion.  Patient denies bladder or bowel dysfunction.  Patient rates the pain today as a 10/10.  Patient is well pain is "achy".  No palliative measure for complaint.  Patient denies chest pain or dyspnea.         Past Medical History:  Diagnosis Date  . Anxiety   . Hyperlipidemia   . Hypertension   . Seizures [facility])     Patient Active Problem List   Diagnosis Date Noted  . Near syncope 12/17/2018  . AKI (acute kidney injury) ([place]) 08/24/2018  . Acute hyperkalemia 08/24/2018  . HTN (hypertension) 08/24/2018  . HLD (hyperlipidemia) 08/24/2018  . Seizures ([place]) 08/24/2018  . Complete heart block ([place]) 08/24/2018  . Stable angina ([place]) 06/27/2017    Past Surgical History:  Procedure Laterality Date  . KNEE SURGERY Left   . LEFT HEART CATH AND CORONARY ANGIOGRAPHY Left 06/29/2017   Procedure: LEFT HEART CATH AND CORONARY ANGIOGRAPHY;  Surgeon: [name], MD;  Location: [place] [facility];  Service: Cardiovascular;  Laterality: Left;  . PACEMAKER INSERTION N/A 08/26/2018   Procedure: INSERTION PACEMAKER;  Surgeon: [name], MD;  Location: [facility] ORS;  Service: Cardiovascular;  Laterality: N/A;  . TEMPORARY PACEMAKER N/A 08/25/2018   Procedure: TEMPORARY  PACEMAKER;  Surgeon: [name], MD;  Location: [place] [facility];  Service: Cardiovascular;  Laterality: N/A;    Prior to Admission medications   Medication Sig Start Date End Date Taking? Authorizing Provider  aspirin 81 MG chewable tablet Chew 81 mg by mouth daily.   Yes [provider]  allopurinol (ZYLOPRIM) 300 MG tablet Take 300 mg by mouth daily. 06/12/18   [provider]  atorvastatin (LIPITOR) 20 MG tablet Take 20 mg by mouth daily. 07/15/18   [provider]  brompheniramine-pseudoephedrine-DM 30-2-10 MG/5ML syrup Take 5 mLs by mouth 4 (four) times daily as needed. 12/30/18   Johnn Hai, PA-C  clonazePAM Bobbye [place]) 0.5 MG tablet 1 tablet in the morning, 2 in the evening 12/16/18   Kathrynn Ducking, MD  hydrocortisone 2.5 % cream Apply 1 application topically daily as needed (for itching).     [provider]  ketoconazole (NIZORAL) 2 % cream Apply 1 application topically daily as needed for irritation.     [provider]  lidocaine (LIDODERM) 5 % Place 1 patch onto the skin every 12 (twelve) hours. Remove & Discard patch within 12 hours or as directed by MD 01/31/19 01/31/20  Sable Feil, PA-C  lisinopril (ZESTRIL) 20 MG tablet Take 20 mg by mouth daily.    [provider]  Multiple Vitamins-Minerals (CENTRUM SILVER PO) Take 1 tablet by mouth daily.    [provider]    Allergies Ibuprofen  No family history on  file.  Social History Social History   Tobacco Use  . Smoking status: Never Smoker  . Smokeless tobacco: Never Used  Substance Use Topics  . Alcohol use: No  . Drug use: No    Review of Systems  Constitutional: No fever/chills Eyes: No visual changes. ENT: No sore throat. Cardiovascular: Denies chest pain. Respiratory: Denies shortness of breath. Gastrointestinal: No abdominal pain.  No nausea, no vomiting.  No diarrhea.  No constipation. Genitourinary: Negative for dysuria.   Left groin pain. Musculoskeletal: Negative for back pain. Skin: Negative for rash. Neurological: Negative for headaches, focal weakness or numbness. Psychiatric:  Anxiety Endocrine:  Lipidemia and hypertension.   Allergic/Immunilogical: Ibuprofen ____________________________________________   PHYSICAL EXAM:  VITAL SIGNS: ED Triage Vitals [01/31/19 0610]  Enc Vitals Group     BP (!) 161/43     Pulse Rate 80     Resp 17     Temp 98.4 F (36.9 C)     Temp Source Oral     SpO2 96 %     Weight 227 lb (103 kg)     Height 5\' 7"  (1.702 m)     Head Circumference      Peak Flow      Pain Score 10     Pain Loc      Pain Edu?      Excl. in [place]?    Constitutional: Alert and oriented. Well appearing and in no acute distress. Cardiovascular: Normal rate, regular rhythm. Grossly normal heart sounds.  Good peripheral circulation. Respiratory: Normal respiratory effort.  No retractions. Lungs CTAB. Gastrointestinal: Soft and nontender. No distention. No abdominal bruits. No CVA tenderness.  Palpable left inguinal mass. Musculoskeletal: No lower extremity tenderness nor edema.  No joint effusions. Neurologic:  Normal speech and language. No gross focal neurologic deficits are appreciated. No gait instability. Skin:  Skin is warm, dry and intact. No rash noted. Psychiatric: Mood and affect are normal. Speech and behavior are normal.  ____________________________________________   LABS (all labs ordered are listed, but only abnormal results are displayed)  Labs Reviewed  BASIC METABOLIC PANEL - Abnormal; Notable for the following components:      Result Value   BUN 24 (*)    All other components within normal limits  CBC WITH DIFFERENTIAL/PLATELET - Abnormal; Notable for the following components:   RBC 3.61 (*)    Hemoglobin 10.4 (*)    HCT 30.9 (*)    Abs Immature Granulocytes 0.09 (*)    All other components within normal limits  PROTIME-INR    ____________________________________________  EKG   ____________________________________________  RADIOLOGY  ED MD interpretation:    Official radiology report(s): [place] Lt [place] Soft Tissue Non Vascular  Result Date: 01/31/2019 CLINICAL DATA:  Left groin mass. EXAM: ULTRASOUND left LOWER EXTREMITY LIMITED TECHNIQUE: Ultrasound examination of the lower extremity soft tissues was performed in the area of clinical concern. COMPARISON:  None FINDINGS: Limited ultrasound imaging of the left groin was performed displaying a heterogeneous masslike area with lobulated contours measuring 11.2 x 7.5 x 6.5 cm. Internal flow is appreciated on color Doppler imaging. No surrounding fluid. This appears to be anterior to femoral vasculature. IMPRESSION: Large mass in the left groin with internal flow. Anterior to vessels in the left groin following vascular access in June. Nodal mass is favored. Suggest CT angiogram of this area to assess for other areas of nodal enlargement and to exclude vascular injury with chronic sequela. Electronically Signed   By: Cay Schillings  Wile M.D.   On: 01/31/2019 08:39   Ct Angio Abd/pel W/ And/or W/o  Result Date: 01/31/2019 CLINICAL DATA:  58 year old male with a history of left inguinal mass EXAM: CTA ABDOMEN AND PELVIS WITHOUT AND WITH CONTRAST TECHNIQUE: Multidetector CT imaging of the abdomen and pelvis was performed using the standard protocol during bolus administration of intravenous contrast. Multiplanar reconstructed images and MIPs were obtained and reviewed to evaluate the vascular anatomy. CONTRAST:  150mL OMNIPAQUE IOHEXOL 350 MG/ML SOLN COMPARISON:  None. FINDINGS: VASCULAR Aorta: Unremarkable course, caliber, contour of the abdominal aorta. No dissection, aneurysm, or periaortic fluid. Minimal atherosclerosis. Celiac: Patent, with no significant atherosclerotic changes. SMA: Patent, with no significant atherosclerotic changes. Renals: Renal arteries are  patent. IMA: Inferior mesenteric artery is patent. Right lower extremity: Unremarkable course, caliber, and contour of the right iliac system. No aneurysm, dissection, or occlusion. Hypogastric artery is patent. Common femoral artery patent. Proximal SFA and profunda femoris patent. Left lower extremity: Unremarkable course, caliber, and contour of the left iliac system. No aneurysm, dissection, or occlusion. Hypogastric artery is patent. Common femoral artery patent. Proximal SFA and profunda femoris patent. Veins: Unremarkable appearance of the venous system. Review of the MIP images confirms the above findings. NON-VASCULAR Lower chest: No acute. Hepatobiliary: Small hypodense lesion in the lateral right liver measures 9 mm on image 45 of series 4. Additional hypodense lesion at the inferior right liver measures 8 mm. Unremarkable gall bladder. Pancreas: Unremarkable. Spleen: Unremarkable. Adrenals/Urinary Tract: Unremarkable appearance of adrenal glands. Right: No hydronephrosis. Symmetric perfusion to the left. No nephrolithiasis. Unremarkable course of the right ureter. Left: No hydronephrosis. Symmetric perfusion to the right. No nephrolithiasis. Unremarkable course of the left ureter. Unremarkable appearance of the urinary bladder . Stomach/Bowel: Hiatal hernia with otherwise unremarkable stomach unremarkable appearance of small bowel. No evidence of obstruction. Normal appendix. Colonic diverticula without associated inflammatory changes. Lymphatic: Heterogeneously attenuating/enhancing mass within the superficial left inguinal region/proximal thigh. Greatest axial dimension of 11.6 cm. AP dimension estimated 7.4 cm. Fat plane maintained between the proximal thigh musculature and the lesion. There are additional small nodules/lymph nodes within the inguinal region with edema in the adjacent fat. Iliac nodes including both internal and external iliac nodes borderline enlarged. Suspicious by number.  Mesenteric: No free fluid or air. No mesenteric adenopathy. Reproductive: Unremarkable appearance of the pelvic organs. Other: Fat containing umbilical hernia. Musculoskeletal: No acute displaced fracture. No aggressive lytic or sclerotic lesions. IMPRESSION: No acute CT finding. Left inguinal mass with diameter as large as 11.6 cm. Leading differential would be lymphoma, although other malignancy such as mesenchymal tumors/sarcoma could have this appearance. There are associated left inguinal and left iliac lymph nodes which are suspicious for involvement. Referral for oncologic workup is indicated. There are 2 small nonspecific hypodense lesions within the right liver identified, nonspecific. Correlation with either PET-CT or contrast-enhanced liver MR may be considered. Aortic Atherosclerosis (ICD10-I70.0). Electronically Signed   By: Corrie Mckusick D.O.   On: 01/31/2019 10:43    ____________________________________________   PROCEDURES  Procedure(s) performed (including Critical Care):  Procedures   ____________________________________________   INITIAL IMPRESSION / ASSESSMENT AND PLAN / ED COURSE  As part of my medical decision making, I reviewed the following data within the [place] was evaluated in Emergency Department on 01/31/2019 for the symptoms described in the history of present illness. He was evaluated in the context of the global COVID-19 pandemic, which necessitated consideration  that the patient might be at risk for infection with the SARS-CoV-2 virus that causes COVID-19. Institutional protocols and algorithms that pertain to the evaluation of patients at risk for COVID-19 are in a state of rapid change based on information released by regulatory bodies including the CDC and federal and state organizations. These policies and algorithms were followed during the patient's care in the ED.  Patient presents with left inguinal mass.   Discussed CT findings with patient.  Patient with a follow-up with oncology for definitive evaluation and treatment.      ____________________________________________   FINAL CLINICAL IMPRESSION(S) / ED DIAGNOSES  Final diagnoses:  Mass of left inguinal region     ED Discharge Orders         Ordered    lidocaine (LIDODERM) 5 %  Every 12 hours     01/31/19 1108           Note:  This document was prepared using Dragon voice recognition software and may include unintentional dictation errors.    Sable Feil, PA-C 01/31/19 1116    Earleen Newport, MD 01/31/19 [phone]

## 2019-01-31 NOTE — ED Triage Notes (Signed)
Patient ambulatory to triage with steady gait, without difficulty or distress noted, mask in place; pt reports intermittent sharp pains to left upper thigh since having an angiogram in June

## 2019-02-03 ENCOUNTER — Other Ambulatory Visit: Payer: Self-pay

## 2019-02-03 ENCOUNTER — Encounter: Payer: Self-pay | Admitting: Oncology

## 2019-02-03 ENCOUNTER — Inpatient Hospital Stay: Payer: BC Managed Care – PPO | Attending: Oncology | Admitting: Oncology

## 2019-02-03 ENCOUNTER — Inpatient Hospital Stay: Payer: BC Managed Care – PPO

## 2019-02-03 VITALS — BP 134/78 | HR 81 | Temp 98.8°F | Resp 18 | Ht 69.29 in | Wt 220.8 lb

## 2019-02-03 DIAGNOSIS — R2242 Localized swelling, mass and lump, left lower limb: Secondary | ICD-10-CM | POA: Diagnosis not present

## 2019-02-03 DIAGNOSIS — R1909 Other intra-abdominal and pelvic swelling, mass and lump: Secondary | ICD-10-CM

## 2019-02-03 DIAGNOSIS — Z85820 Personal history of malignant melanoma of skin: Secondary | ICD-10-CM | POA: Insufficient documentation

## 2019-02-03 DIAGNOSIS — K769 Liver disease, unspecified: Secondary | ICD-10-CM | POA: Insufficient documentation

## 2019-02-03 DIAGNOSIS — E119 Type 2 diabetes mellitus without complications: Secondary | ICD-10-CM | POA: Insufficient documentation

## 2019-02-03 DIAGNOSIS — R112 Nausea with vomiting, unspecified: Secondary | ICD-10-CM | POA: Diagnosis not present

## 2019-02-03 LAB — COMPREHENSIVE METABOLIC PANEL
ALT: 22 U/L (ref 0–44)
AST: 21 U/L (ref 15–41)
Albumin: 4.1 g/dL (ref 3.5–5.0)
Alkaline Phosphatase: 79 U/L (ref 38–126)
Anion gap: 10 (ref 5–15)
BUN: 26 mg/dL — ABNORMAL HIGH (ref 6–20)
CO2: 26 mmol/L (ref 22–32)
Calcium: 9.5 mg/dL (ref 8.9–10.3)
Chloride: 101 mmol/L (ref 98–111)
Creatinine, Ser: 1.46 mg/dL — ABNORMAL HIGH (ref 0.61–1.24)
GFR calc Af Amer: >60 mL/min (ref >60)
GFR calc non Af Amer: 52 mL/min — ABNORMAL LOW (ref >60)
Glucose, Bld: 128 mg/dL — ABNORMAL HIGH (ref 70–99)
Potassium: 4 mmol/L (ref 3.5–5.1)
Sodium: 137 mmol/L (ref 135–145)
Total Bilirubin: 0.6 mg/dL (ref 0.3–1.2)
Total Protein: 8.1 g/dL (ref 6.5–8.1)

## 2019-02-03 LAB — CBC WITH DIFFERENTIAL/PLATELET
Abs Immature Granulocytes: 0.07 10*3/uL (ref 0.00–0.07)
Basophils Absolute: 0 10*3/uL (ref 0.0–0.1)
Basophils Relative: 0 %
Eosinophils Absolute: 0.2 10*3/uL (ref 0.0–0.5)
Eosinophils Relative: 2 %
HCT: 31.8 % — ABNORMAL LOW (ref 39.0–52.0)
Hemoglobin: 10.6 g/dL — ABNORMAL LOW (ref 13.0–17.0)
Immature Granulocytes: 1 %
Lymphocytes Relative: 15 %
Lymphs Abs: 1.2 10*3/uL (ref 0.7–4.0)
MCH: 29.2 pg (ref 26.0–34.0)
MCHC: 33.3 g/dL (ref 30.0–36.0)
MCV: 87.6 fL (ref 80.0–100.0)
Monocytes Absolute: 0.8 10*3/uL (ref 0.1–1.0)
Monocytes Relative: 11 %
Neutro Abs: 5.5 10*3/uL (ref 1.7–7.7)
Neutrophils Relative %: 71 %
Platelets: 286 10*3/uL (ref 150–400)
RBC: 3.63 MIL/uL — ABNORMAL LOW (ref 4.22–5.81)
RDW: 13.2 % (ref 11.5–15.5)
WBC: 7.8 10*3/uL (ref 4.0–10.5)
nRBC: 0 % (ref 0.0–0.2)

## 2019-02-03 LAB — RETIC PANEL
Immature Retic Fract: 20.6 % — ABNORMAL HIGH (ref 2.3–15.9)
RBC.: 3.63 MIL/uL — ABNORMAL LOW (ref 4.22–5.81)
Retic Count, Absolute: 84.6 10*3/uL (ref 19.0–186.0)
Retic Ct Pct: 2.3 % (ref 0.4–3.1)
Reticulocyte Hemoglobin: 30.4 pg (ref >27.9)

## 2019-02-03 LAB — TECHNOLOGIST SMEAR REVIEW: RBC Morphology: NORMAL

## 2019-02-03 LAB — HEPATITIS PANEL, ACUTE
HCV Ab: NONREACTIVE
Hep A IgM: NONREACTIVE
Hep B C IgM: NONREACTIVE
Hepatitis B Surface Ag: NONREACTIVE

## 2019-02-03 LAB — URIC ACID: Uric Acid, Serum: 6.2 mg/dL (ref 3.7–8.6)

## 2019-02-03 LAB — LACTATE DEHYDROGENASE: LDH: 384 U/L — ABNORMAL HIGH (ref 98–192)

## 2019-02-03 LAB — HIV ANTIBODY (ROUTINE TESTING W REFLEX): HIV Screen 4th Generation wRfx: NONREACTIVE

## 2019-02-03 MED ORDER — ONDANSETRON HCL 8 MG PO TABS: 8.0000 mg | ORAL_TABLET | Freq: Three times a day (TID) | ORAL | 0 refills | Status: DC | PRN

## 2019-02-03 NOTE — Progress Notes (Signed)
Hematology/Oncology Consult note [facility] [phone] [phone]   Patient Care Team: Petra Kuba, MD as PCP - General (Family Medicine)  REFERRING PROVIDER: Earleen Newport, MD  CHIEF COMPLAINTS/REASON FOR VISIT:  Evaluation of inguinal mass  HISTORY OF PRESENTING ILLNESS:   Edward Pitts is a  58 y.o.  male with PMH listed below was seen in consultation at the request of  Earleen Newport, MD  for evaluation of inguinal mass Patient presented to emergency room 3 days ago complaining about left inguinal mass discomfort. Reports that he has really noticed the mass growing for the past 1 months. He has a history of left lower extremity melanoma in 2011, status post local excision.  Pain was increased with squatting of laxation. He was advised to take Tylenol for pain. Denies any fever, chills, night sweating.  He does feel mild nauseated. Appetite is fair.  He has lost about 10 pounds since earlier this year. 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  Left inguinal and left iliac nodes which are suspicious for involvement.  There are also 2 small nonspecific hypodense lesions within the right liver, nonspecific. He was accompanied by wife today.   Review of Systems  Constitutional: Positive for appetite change, fatigue and unexpected weight change. Negative for chills and fever.  HENT:   Negative for hearing loss and voice change.   Eyes: Negative for eye problems and icterus.  Respiratory: Negative for chest tightness, cough and shortness of breath.   Cardiovascular: Negative for chest pain and leg swelling.  Gastrointestinal: Negative for abdominal distention and abdominal pain.  Endocrine: Negative for hot flashes.  Genitourinary: Negative for difficulty urinating, dysuria and frequency.   Musculoskeletal: Negative for arthralgias.  Skin: Negative for  itching and rash.       History of left lower extremity melanoma status post excision.  Neurological: Negative for light-headedness and numbness.  Hematological: Negative for adenopathy. Does not bruise/bleed easily.  Psychiatric/Behavioral: Negative for confusion.    MEDICAL HISTORY:  Past Medical History:  Diagnosis Date  . Anxiety   . Hyperlipidemia   . Hypertension   . Melanoma ([place]) 2012  . Seizures ([place])     SURGICAL HISTORY: Past Surgical History:  Procedure Laterality Date  . KNEE SURGERY Left   . LEFT HEART CATH AND CORONARY ANGIOGRAPHY Left 06/29/2017   Procedure: LEFT HEART CATH AND CORONARY ANGIOGRAPHY;  Surgeon: [name], MD;  Location: [facility];  Service: Cardiovascular;  Laterality: Left;  . PACEMAKER INSERTION N/A 08/26/2018   Procedure: INSERTION PACEMAKER;  Surgeon: [name], MD;  Location: [facility];  Service: Cardiovascular;  Laterality: N/A;  . TEMPORARY PACEMAKER N/A 08/25/2018   Procedure: TEMPORARY PACEMAKER;  Surgeon: [name], MD;  Location: [facility];  Service: Cardiovascular;  Laterality: N/A;    SOCIAL HISTORY: Social History   Socioeconomic History  . Marital status: Married    Spouse name: Not on file  . Number of children: Not on file  . Years of education: 71  . Highest education level: Not on file  Occupational History  . Occupation: [employer]   Social Needs  . Financial resource strain: Not on file  . Food insecurity    Worry: Not on file    Inability: Not on file  . Transportation needs    Medical: Not on file    Non-medical: Not on file  Tobacco Use  . Smoking status: Never Smoker  . Smokeless tobacco: Never Used  Substance and Sexual Activity  . Alcohol use: No  . Drug use: No  . Sexual activity: Not on file  Lifestyle  . Physical activity    Days per week: Not on file    Minutes per session: Not on file  . Stress: Not on file  Relationships  . Social Herbalist on  phone: Not on file    Gets together: Not on file    Attends religious service: Not on file    Active member of club or organization: Not on file    Attends meetings of clubs or organizations: Not on file    Relationship status: Not on file  . Intimate partner violence    Fear of current or ex partner: Not on file    Emotionally abused: Not on file    Physically abused: Not on file    Forced sexual activity: Not on file  Other Topics Concern  . Not on file  Social History Narrative   Lives with mother, wife and sister   Caffeine use: sodas (2 per day)    FAMILY HISTORY: Family History  Problem Relation Age of Onset  . Cancer Paternal Grandmother     ALLERGIES:  is allergic to ibuprofen.  MEDICATIONS:  Current Outpatient Medications  Medication Sig Dispense Refill  . allopurinol (ZYLOPRIM) 300 MG tablet Take 300 mg by mouth daily.    Marland Kitchen aspirin 81 MG chewable tablet Chew 81 mg by mouth daily.    Marland Kitchen atorvastatin (LIPITOR) 20 MG tablet Take 20 mg by mouth daily.    . clonazePAM (KLONOPIN) 0.5 MG tablet 1 tablet in the morning, 2 in the evening 90 tablet 3  . hydrocortisone 2.5 % cream Apply 1 application topically daily as needed (for itching).     Marland Kitchen ketoconazole (NIZORAL) 2 % cream Apply 1 application topically daily as needed for irritation.     . lidocaine (LIDODERM) 5 % Place 1 patch onto the skin every 12 (twelve) hours. Remove & Discard patch within 12 hours or as directed by MD 10 patch 0  . lisinopril (ZESTRIL) 20 MG tablet Take 20 mg by mouth daily.    . metoprolol succinate (TOPROL-XL) 25 MG 24 hr tablet     . Multiple Vitamins-Minerals (CENTRUM SILVER PO) Take 1 tablet by mouth daily.    . ondansetron (ZOFRAN) 8 MG tablet Take 1 tablet (8 mg total) by mouth every 8 (eight) hours as needed for nausea, vomiting or refractory nausea / vomiting. 30 tablet 0   No current facility-administered medications for this visit.      PHYSICAL EXAMINATION: ECOG PERFORMANCE STATUS:  1 - Symptomatic but completely ambulatory Vitals:   02/03/19 1051  BP: 134/78  Pulse: 81  Resp: 18  Temp: 98.8 F (37.1 C)   Filed Weights   02/03/19 1051  Weight: 220 lb 12.8 oz (100.2 kg)   RN Janeann Merl presents for chaperone during physical examination. Physical Exam Constitutional:      General: He is not in acute distress. HENT:     Head: Normocephalic and atraumatic.  Eyes:     General: No scleral icterus.    Pupils: Pupils are equal, round, and reactive to light.  Neck:     Musculoskeletal: Normal range of motion and neck supple.  Cardiovascular:     Rate and Rhythm: Normal rate and regular rhythm.     Heart  sounds: Normal heart sounds.  Pulmonary:     Effort: Pulmonary effort is normal. No respiratory distress.     Breath sounds: No wheezing.  Abdominal:     General: Bowel sounds are normal. There is no distension.     Palpations: Abdomen is soft. There is no mass.     Tenderness: There is no abdominal tenderness.     Comments: Left inguinal palpable mass about 11 cm  Musculoskeletal: Normal range of motion.        General: No deformity.  Skin:    General: Skin is warm and dry.     Findings: No erythema or rash.  Neurological:     Mental Status: He is alert and oriented to person, place, and time.     Cranial Nerves: No cranial nerve deficit.     Coordination: Coordination normal.  Psychiatric:        Behavior: Behavior normal.        Thought Content: Thought content normal.   Declined testicle examination. LABORATORY DATA:  I have reviewed the data as listed Lab Results  Component Value Date   WBC 7.8 02/03/2019   HGB 10.6 (L) 02/03/2019   HCT 31.8 (L) 02/03/2019   MCV 87.6 02/03/2019   PLT 286 02/03/2019   Recent Labs    08/25/18 0436 08/25/18 0936  08/28/18 0355 01/31/19 0925 02/03/19 1144  NA 140 140   < > 137 139 137  K 4.3 4.6   < > 4.7 4.5 4.0  CL 101 102   < > 104 106 101  CO2 29 27   < > 24 25 26   GLUCOSE 127* 120*   < >  103* 85 128*  BUN 37* 46*   < > 22* 24* 26*  CREATININE 2.46* 2.94*   < > 1.36* 1.16 1.46*  CALCIUM 9.1 9.6   < > 8.9 9.1 9.5  GFRNONAA 28* 22*   < > 57* >60 52*  GFRAA 32* 26*   < > >60 >60 >60  PROT  --  6.6  --   --   --  8.1  ALBUMIN 3.8 3.6  --   --   --  4.1  AST  --  29  --   --   --  21  ALT  --  51*  --   --   --  22  ALKPHOS  --  70  --   --   --  79  BILITOT  --  0.9  --   --   --  0.6   < > = values in this interval not displayed.   Iron/TIBC/Ferritin/ %Sat No results found for: IRON, TIBC, FERRITIN, IRONPCTSAT    RADIOGRAPHIC STUDIES: I have personally reviewed the radiological images as listed and agreed with the findings in the report.  [place] Soft Tissue Non Vascular  Result Date: 01/31/2019 CLINICAL DATA:  Left groin mass. EXAM: ULTRASOUND left LOWER EXTREMITY LIMITED TECHNIQUE: Ultrasound examination of the lower extremity soft tissues was performed in the area of clinical concern. COMPARISON:  None FINDINGS: Limited ultrasound imaging of the left groin was performed displaying a heterogeneous masslike area with lobulated contours measuring 11.2 x 7.5 x 6.5 cm. Internal flow is appreciated on color Doppler imaging. No surrounding fluid. This appears to be anterior to femoral vasculature. IMPRESSION: Large mass in the left groin with internal flow. Anterior to vessels in the left groin following vascular access in June. Nodal mass  is favored. Suggest CT angiogram of this area to assess for other areas of nodal enlargement and to exclude vascular injury with chronic sequela. Electronically Signed   By: Zetta Bills M.D.   On: 01/31/2019 08:39   Ct Angio Abd/pel W/ And/or W/o  Result Date: 01/31/2019 CLINICAL DATA:  58 year old male with a history of left inguinal mass EXAM: CTA ABDOMEN AND PELVIS WITHOUT AND WITH CONTRAST TECHNIQUE: Multidetector CT imaging of the abdomen and pelvis was performed using the standard protocol during bolus administration of  intravenous contrast. Multiplanar reconstructed images and MIPs were obtained and reviewed to evaluate the vascular anatomy. CONTRAST:  189mL OMNIPAQUE IOHEXOL 350 MG/ML SOLN COMPARISON:  None. FINDINGS: VASCULAR Aorta: Unremarkable course, caliber, contour of the abdominal aorta. No dissection, aneurysm, or periaortic fluid. Minimal atherosclerosis. Celiac: Patent, with no significant atherosclerotic changes. SMA: Patent, with no significant atherosclerotic changes. Renals: Renal arteries are patent. IMA: Inferior mesenteric artery is patent. Right lower extremity: Unremarkable course, caliber, and contour of the right iliac system. No aneurysm, dissection, or occlusion. Hypogastric artery is patent. Common femoral artery patent. Proximal SFA and profunda femoris patent. Left lower extremity: Unremarkable course, caliber, and contour of the left iliac system. No aneurysm, dissection, or occlusion. Hypogastric artery is patent. Common femoral artery patent. Proximal SFA and profunda femoris patent. Veins: Unremarkable appearance of the venous system. Review of the MIP images confirms the above findings. NON-VASCULAR Lower chest: No acute. Hepatobiliary: Small hypodense lesion in the lateral right liver measures 9 mm on image 45 of series 4. Additional hypodense lesion at the inferior right liver measures 8 mm. Unremarkable gall bladder. Pancreas: Unremarkable. Spleen: Unremarkable. Adrenals/Urinary Tract: Unremarkable appearance of adrenal glands. Right: No hydronephrosis. Symmetric perfusion to the left. No nephrolithiasis. Unremarkable course of the right ureter. Left: No hydronephrosis. Symmetric perfusion to the right. No nephrolithiasis. Unremarkable course of the left ureter. Unremarkable appearance of the urinary bladder . Stomach/Bowel: Hiatal hernia with otherwise unremarkable stomach unremarkable appearance of small bowel. No evidence of obstruction. Normal appendix. Colonic diverticula without associated  inflammatory changes. Lymphatic: Heterogeneously attenuating/enhancing mass within the superficial left inguinal region/proximal thigh. Greatest axial dimension of 11.6 cm. AP dimension estimated 7.4 cm. Fat plane maintained between the proximal thigh musculature and the lesion. There are additional small nodules/lymph nodes within the inguinal region with edema in the adjacent fat. Iliac nodes including both internal and external iliac nodes borderline enlarged. Suspicious by number. Mesenteric: No free fluid or air. No mesenteric adenopathy. Reproductive: Unremarkable appearance of the pelvic organs. Other: Fat containing umbilical hernia. Musculoskeletal: No acute displaced fracture. No aggressive lytic or sclerotic lesions. IMPRESSION: No acute CT finding. Left inguinal mass with diameter as large as 11.6 cm. Leading differential would be lymphoma, although other malignancy such as mesenchymal tumors/sarcoma could have this appearance. There are associated left inguinal and left iliac lymph nodes which are suspicious for involvement. Referral for oncologic workup is indicated. There are 2 small nonspecific hypodense lesions within the right liver identified, nonspecific. Correlation with either PET-CT or contrast-enhanced liver MR may be considered. Aortic Atherosclerosis (ICD10-I70.0). Electronically Signed   By: Corrie Mckusick D.O.   On: 01/31/2019 10:43      ASSESSMENT & PLAN:  1. Mass of inguinal region   2. Liver lesion   3. Non-intractable vomiting with nausea, unspecified vomiting type    CT images were independently reviewed by me and discussed with patient. Concerning inguinal mass, iliac lymphadenopathy, differential includes lymphoma, metastatic melanoma given his history of left  lower extremity skin melanoma. Recommend to obtain a PET scan for staging. Check LDH, AFP, beta-hCG, uric acid, check smear, CBC, CMP, hepatitis panel, HIV. recommend ultrasound-guided biopsy of the inguinal mass  to establish diagnosis.  For pain, advised patient to continue Tylenol 650 mg every 6 hours as needed.  Patient knows to call the office if pain is not improved.  Nausea, recommend trials of Zofran 8 mg every 8 hours as needed prescription sent to pharmacy   Orders Placed This Encounter  Procedures  . NM PET Image Initial (PI) Skull Base To Thigh    Standing Status:   Future    Standing Expiration Date:   02/03/2020    Order Specific Question:   ** REASON FOR EXAM (FREE TEXT)    Answer:   inguinal mass    Order Specific Question:   If indicated for the ordered procedure, I authorize the administration of a radiopharmaceutical per Radiology protocol    Answer:   Yes    Order Specific Question:   Radiology Contrast Protocol - do NOT remove file path    Answer:   [email or web address]  . Lactate Dehydrogenase    Standing Status:   Future    Number of Occurrences:   1    Standing Expiration Date:   02/03/2020  . Uric acid    Standing Status:   Future    Number of Occurrences:   1    Standing Expiration Date:   02/03/2020  . Beta HCG, Quant (tumor marker)    Standing Status:   Future    Number of Occurrences:   1    Standing Expiration Date:   02/03/2020  . AFP tumor marker    Standing Status:   Future    Number of Occurrences:   1    Standing Expiration Date:   02/03/2020  . Retic Panel    Standing Status:   Future    Number of Occurrences:   1    Standing Expiration Date:   02/03/2020  . Flow cytometry panel-leukemia/lymphoma work-up    Standing Status:   Future    Number of Occurrences:   1    Standing Expiration Date:   02/03/2020  . CBC with Differential/Platelet    Standing Status:   Future    Number of Occurrences:   1    Standing Expiration Date:   02/03/2020  . Technologist smear review    Standing Status:   Future    Number of Occurrences:   1    Standing Expiration Date:   02/03/2020  . Comprehensive metabolic panel    Standing Status:   Future     Number of Occurrences:   1    Standing Expiration Date:   02/03/2020  . Hepatitis panel, acute    Standing Status:   Future    Number of Occurrences:   1    Standing Expiration Date:   02/03/2020  . HIV ANTIBODY (ROUTINE TETSING W RELFEX)    Standing Status:   Future    Number of Occurrences:   1    Standing Expiration Date:   02/03/2020    All questions were answered. The patient knows to call the clinic with any problems questions or concerns.  Cc Petra Kuba, MD   Return of visit:  1 week after inguinal mass biopsy Thank you for this kind referral and the opportunity to participate in the care of this patient. A copy of today's note is routed  to referring provider  Total face to face encounter time for this patient visit was 60 min. >50% of the time was  spent in counseling and coordinatiof care.    [name], MD, PhD Hematology Oncology [facility] Pager- [phone] 02/03/2019

## 2019-02-03 NOTE — Progress Notes (Signed)
Patient has been having vomiting after eating.  Has episodes of left leg pain that is 10/10 with last episode yesterday.  Reports having a pacemaker.

## 2019-02-04 ENCOUNTER — Telehealth: Payer: Self-pay

## 2019-02-04 LAB — BETA HCG QUANT (REF LAB): hCG Quant: <1 m[IU]/mL (ref 0–3)

## 2019-02-04 LAB — AFP TUMOR MARKER: AFP, Serum, Tumor Marker: 2 ng/mL (ref 0.0–8.3)

## 2019-02-04 NOTE — Telephone Encounter (Signed)
Request for biopsy arrangement sent to central scheduling.

## 2019-02-04 NOTE — Telephone Encounter (Signed)
-----   Message from [name], MD sent at 02/03/2019  4:26 PM EST ----- Please arrange patient to have US guided biopsy of left inguinal mass. We discussed about that.  And schedule him to see me 1 week after biopsy. Thanks.

## 2019-02-06 NOTE — Progress Notes (Signed)
Patient scheduled for BMB 02/12/2019, called patient with pre procedure instructions given, questions answered. Aware to be NPO after MN along with be here @ 0930, stated understanding.

## 2019-02-06 NOTE — Progress Notes (Signed)
Deaveon Lassonde Male, 58 y.o., May 18, 1960 MRN:  [id number] Phone:  [phone] Jerilynn Mages) PCP:  Petra Kuba, MD Primary Cvg:  Shan Levans Next Appt With Radiology ([facility]-PET CT1) 02/11/2019 at 8:30 AM RE: Biopsy Received: Today Message Contents  Arne Cleveland, MD  Lenore Cordia  I think I already approved this once but anyway . . .    [place] core L ing mass    DDH   Previous Messages  ----- Message -----  From: Lenore Cordia  Sent: 02/05/2019  3:55 PM EST  To: Ir Procedure Requests  Subject: Biopsy                      Procedure Requested: [place] Core Biopsy    Reason for Procedure: Left inguinal mass    Provider Requesting:  [name], MD  Provider Telephone:  [phone]   Other Info: Rad Exam in Epic , Future: NM Pet Scan 02/11/2019

## 2019-02-06 NOTE — Telephone Encounter (Signed)
Pt scheduled for biopsy on 11/18 and will follow up with Dr. Tasia Catchings on 12/3. Patient aware.

## 2019-02-10 LAB — COMP PANEL: LEUKEMIA/LYMPHOMA
CLINICAL INFO: 9
Immunophenotypic Profile: 1

## 2019-02-11 ENCOUNTER — Ambulatory Visit
Admission: RE | Admit: 2019-02-11 | Discharge: 2019-02-11 | Disposition: A | Discharge status: Home / Self Care | Payer: BC Managed Care – PPO | Source: Ambulatory Visit | Attending: Oncology | Admitting: Oncology

## 2019-02-11 ENCOUNTER — Other Ambulatory Visit: Payer: Self-pay

## 2019-02-11 DIAGNOSIS — Z85820 Personal history of malignant melanoma of skin: Secondary | ICD-10-CM | POA: Insufficient documentation

## 2019-02-11 DIAGNOSIS — R1909 Other intra-abdominal and pelvic swelling, mass and lump: Secondary | ICD-10-CM | POA: Insufficient documentation

## 2019-02-11 LAB — GLUCOSE, CAPILLARY: Glucose-Capillary: 100 mg/dL — ABNORMAL HIGH (ref 70–99)

## 2019-02-11 MED ORDER — FLUDEOXYGLUCOSE F - 18 (FDG) INJECTION
11.4000 | Freq: Once | INTRAVENOUS | Status: AC | PRN
  Administered 2019-02-11: 11.4 via INTRAVENOUS

## 2019-02-12 ENCOUNTER — Ambulatory Visit
Admission: RE | Admit: 2019-02-12 | Discharge: 2019-02-12 | Disposition: A | Discharge status: Home / Self Care | Payer: BC Managed Care – PPO | Source: Ambulatory Visit | Attending: Oncology | Admitting: Oncology

## 2019-02-12 ENCOUNTER — Other Ambulatory Visit: Payer: Self-pay

## 2019-02-12 DIAGNOSIS — R590 Localized enlarged lymph nodes: Secondary | ICD-10-CM | POA: Insufficient documentation

## 2019-02-12 DIAGNOSIS — R1909 Other intra-abdominal and pelvic swelling, mass and lump: Secondary | ICD-10-CM | POA: Diagnosis present

## 2019-02-12 MED ORDER — MIDAZOLAM HCL 2 MG/2ML IJ SOLN
INTRAMUSCULAR | Status: AC | PRN
  Administered 2019-02-12 (×2): 1 mg via INTRAVENOUS

## 2019-02-12 MED ORDER — FENTANYL CITRATE (PF) 100 MCG/2ML IJ SOLN
INTRAMUSCULAR | Status: AC
  Filled 2019-02-12: qty 2

## 2019-02-12 MED ORDER — FENTANYL CITRATE (PF) 100 MCG/2ML IJ SOLN
INTRAMUSCULAR | Status: AC | PRN
  Administered 2019-02-12 (×2): 50 ug via INTRAVENOUS

## 2019-02-12 MED ORDER — MIDAZOLAM HCL 2 MG/2ML IJ SOLN
INTRAMUSCULAR | Status: AC
  Filled 2019-02-12: qty 2

## 2019-02-12 MED ORDER — SODIUM CHLORIDE 0.9 % IV SOLN
INTRAVENOUS | Status: DC
  Administered 2019-02-12: 11:00:00 via INTRAVENOUS

## 2019-02-12 NOTE — Progress Notes (Signed)
Patient clinically stable post Inguinal LN biopsy with Dr Vernard Gambles, tolerated well, denies complaints post procedure. Received Versed 2mg  along with Fentanyl 161mcg IV for procedure.bandade dry and intact.ro right groin site.

## 2019-02-12 NOTE — Procedures (Signed)
  Procedure: [place] core L inguinal LAN 18g x3 in saline EBL:   minimal Complications:  none immediate  See full dictation in [facility].  Dillard Cannon MD Main # [phone] Pager  [phone]

## 2019-02-12 NOTE — Progress Notes (Addendum)
Dr. Vernard Gambles in at bedside, speaking with pt. And evaluating Left groin large mass. Pt. Verbalizes understanding of procedure and agreeable to procedure. Groin mass approx. 10 in. Long x 3 in. Wide; pink in appearance. Hard to palpation (like a hematoma). No active drainage, ecchymosis. Mass "raised"  in appearance approx. 1/2 in. Above skin baseline. Pt. States pain is Intemittent, "sharp". Denies pain at present at site.

## 2019-02-17 LAB — SURGICAL PATHOLOGY

## 2019-02-25 ENCOUNTER — Other Ambulatory Visit: Payer: Self-pay | Admitting: Oncology

## 2019-02-25 ENCOUNTER — Telehealth: Payer: Self-pay

## 2019-02-25 NOTE — Telephone Encounter (Signed)
Omniseq Test request sent to [facility] pathology on case: [id number]. Fax confimation received.

## 2019-02-26 ENCOUNTER — Other Ambulatory Visit: Payer: Self-pay

## 2019-02-26 MED ORDER — ONDANSETRON HCL 8 MG PO TABS: 8.0000 mg | ORAL_TABLET | Freq: Three times a day (TID) | ORAL | 0 refills | Status: DC | PRN

## 2019-02-26 NOTE — Progress Notes (Signed)
Pharmacy states they did not receive rx sent on 02/03/2019.

## 2019-02-26 NOTE — Progress Notes (Signed)
Patient pre screened for office appointment, no questions or concerns today. Patient reminded of upcoming appointment time and date. Patient states that he did not get his prescription for nausea medicine from last visit. Care team notified.

## 2019-02-27 ENCOUNTER — Other Ambulatory Visit: Payer: Self-pay

## 2019-02-27 ENCOUNTER — Inpatient Hospital Stay: Payer: BC Managed Care – PPO | Attending: Oncology | Admitting: Oncology

## 2019-02-27 ENCOUNTER — Telehealth: Payer: Self-pay

## 2019-02-27 ENCOUNTER — Telehealth: Payer: Self-pay | Admitting: *Deleted

## 2019-02-27 ENCOUNTER — Other Ambulatory Visit

## 2019-02-27 VITALS — BP 123/63 | HR 66 | Temp 98.4°F | Wt 232.6 lb

## 2019-02-27 DIAGNOSIS — C4359 Malignant melanoma of other part of trunk: Secondary | ICD-10-CM | POA: Insufficient documentation

## 2019-02-27 DIAGNOSIS — C439 Malignant melanoma of skin, unspecified: Secondary | ICD-10-CM | POA: Diagnosis not present

## 2019-02-27 DIAGNOSIS — E119 Type 2 diabetes mellitus without complications: Secondary | ICD-10-CM | POA: Insufficient documentation

## 2019-02-27 DIAGNOSIS — Z7189 Other specified counseling: Secondary | ICD-10-CM

## 2019-02-27 NOTE — Progress Notes (Addendum)
Tumor Board Documentation  Edward Pitts was presented by Dr Tasia Catchings at our Tumor Board on 02/27/2019, which included representatives from medical oncology, radiation oncology, navigation, pathology, radiology, surgical, internal medicine, genetics, palliative care, pulmonology, research.  Edward Pitts currently presents as a new patient, for [place], for new positive pathology with history of the following treatments: active survellience, surgical intervention(s).  Additionally, we reviewed previous medical and familial history, history of present illness, and recent lab results along with all available histopathologic and imaging studies. The tumor board considered available treatment options and made the following recommendations: Surgical resection followed by radiation and systemic antineoplastic treatment.    The following procedures/referrals were also placed: No orders of the defined types were placed in this encounter.   Clinical Trial Status: not discussed   Staging used: Pathologic Stage  AJCC Staging:       Group: Metastatic Melanoma   National site-specific guidelines   were discussed with respect to the case.  Tumor board is a meeting of clinicians from various specialty areas who evaluate and discuss patients for whom a multidisciplinary approach is being considered. Final determinations in the plan of care are those of the provider(s). The responsibility for follow up of recommendations given during tumor board is that of the provider.   Today's extended care, comprehensive team conference, Edward Pitts was not present for the discussion and was not examined.   Multidisciplinary Tumor Board is a multidisciplinary case peer review process.  Decisions discussed in the Multidisciplinary Tumor Board reflect the opinions of the specialists present at the conference without having examined the patient.  Ultimately, treatment and diagnostic decisions rest with the primary provider(s) and the  patient.

## 2019-02-27 NOTE — Telephone Encounter (Signed)
spoke to Kihei at [employer] regarding patient's pacemaker and she said he has an MRI surescan conditional pacemaker, which means it will have to be put in safe mode before the MRI can be done. This can only be done at [facility] per MRI.

## 2019-02-27 NOTE — Telephone Encounter (Signed)
Per Morey Hummingbird from [employer] stated that she has to contact [facility] to get an approval for [facility] Review for the brain MRI then she would reach out to the patient and inform him of appt details

## 2019-02-28 ENCOUNTER — Telehealth: Payer: Self-pay

## 2019-02-28 NOTE — Telephone Encounter (Signed)
Patient has called back and he has been informed of appointment details.

## 2019-02-28 NOTE — Telephone Encounter (Signed)
Patient has an appointment with Dr. Georganna Skeans at [facility] (P: [phone] F: [phone])  On 03/19/19 @ 10:20, arrive 30 minutes early.  They will put him on the wait list to call if there are any cancellations for an earlier visit.

## 2019-02-28 NOTE — Telephone Encounter (Signed)
Thank you :)

## 2019-03-01 ENCOUNTER — Encounter: Payer: Self-pay | Admitting: Oncology

## 2019-03-01 NOTE — Progress Notes (Signed)
Hematology/Oncology follow up  note [facility] Telephone:(336) [phone] Fax:(336) [phone]   Patient Care Team: [name], [name], MD as PCP - General (Student)  REFERRING PROVIDER: Petra Kuba, MD  CHIEF COMPLAINTS/REASON FOR VISIT:  Follow up for melanoma HISTORY OF PRESENTING ILLNESS:   Edward Pitts is a  58 y.o.  male with PMH listed below was seen in consultation at the request of  Petra Kuba, MD  for evaluation of inguinal mass Patient presented to emergency room 3 days ago complaining about left inguinal mass discomfort. Reports that he has really noticed the mass growing for the past 1 months. He has a history of left lower extremity melanoma in 2011, status post local excision.  Pain was increased with squatting of laxation. He was advised to take Tylenol for pain. Denies any fever, chills, night sweating.  He does feel mild nauseated. Appetite is fair.  He has lost about 10 pounds since earlier this year. 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  Left inguinal and left iliac nodes which are suspicious for involvement.  There are also 2 small nonspecific hypodense lesions within the right liver, nonspecific. He was accompanied by wife today.   INTERVAL HISTORY Edward Pitts is a 58 y.o. male who has above history reviewed by me today presents for follow up visit for management of newly diagnosed inguinal nodal recurrence of melanoma Problems and complaints are listed below: S/p biopsy. Patient is here to discuss results and management plan.  Intermittent left inguinal discomfort,he takes Tylenol if needed.   Review of Systems  Constitutional: Positive for appetite change, fatigue and unexpected weight change. Negative for chills and fever.  HENT:   Negative for hearing loss and voice change.   Eyes: Negative for eye problems and icterus.  Respiratory: Negative for chest  tightness, cough and shortness of breath.   Cardiovascular: Negative for chest pain and leg swelling.  Gastrointestinal: Negative for abdominal distention and abdominal pain.  Endocrine: Negative for hot flashes.  Genitourinary: Negative for difficulty urinating, dysuria and frequency.   Musculoskeletal: Negative for arthralgias.  Skin: Negative for itching and rash.       History of left lower extremity melanoma status post excision.  Neurological: Negative for light-headedness and numbness.  Hematological: Negative for adenopathy. Does not bruise/bleed easily.  Psychiatric/Behavioral: Negative for confusion.    MEDICAL HISTORY:  Past Medical History:  Diagnosis Date   Anxiety    Hyperlipidemia    Hypertension    Melanoma ([place]) 2012   Seizures ([place])     SURGICAL HISTORY: Past Surgical History:  Procedure Laterality Date   KNEE SURGERY Left    LEFT HEART CATH AND CORONARY ANGIOGRAPHY Left 06/29/2017   Procedure: LEFT HEART CATH AND CORONARY ANGIOGRAPHY;  Surgeon: [name], MD;  Location: [place] [facility];  Service: Cardiovascular;  Laterality: Left;   MELANOMA EXCISION WITH SENTINEL LYMPH NODE BIOPSY Left 2012   Left calf    PACEMAKER INSERTION N/A 08/26/2018   Procedure: INSERTION PACEMAKER;  Surgeon: [name], MD;  Location: [facility];  Service: Cardiovascular;  Laterality: N/A;   TEMPORARY PACEMAKER N/A 08/25/2018   Procedure: TEMPORARY PACEMAKER;  Surgeon: [name], MD;  Location: [place] [facility];  Service: Cardiovascular;  Laterality: N/A;    SOCIAL HISTORY: Social History   Socioeconomic History   Marital status: Married    Spouse name: Vicente Males    Number  of children: 7   Years of education: 12   Highest education level: Not on file  Occupational History   Occupation: Database administrator strain: Not hard at all   Food insecurity    Worry: Never true    Inability: Never true   Transportation  needs    Medical: Yes    Non-medical: Yes  Tobacco Use   Smoking status: Never Smoker   Smokeless tobacco: Never Used  Substance and Sexual Activity   Alcohol use: No   Drug use: No   Sexual activity: Not on file  Lifestyle   Physical activity    Days per week: 0 days    Minutes per session: Not on file   Stress: Only a little  Relationships   Social connections    Talks on phone: More than three times a week    Gets together: Not on file    Attends religious service: Not on file    Active member of club or organization: Not on file    Attends meetings of clubs or organizations: Not on file    Relationship status: Married   Intimate partner violence    Fear of current or ex partner: No    Emotionally abused: No    Physically abused: No    Forced sexual activity: No  Other Topics Concern   Not on file  Social History Narrative   Lives with mother, wife and sister   Caffeine use: sodas (2 per day)    FAMILY HISTORY: Family History  Problem Relation Age of Onset   Cancer Paternal Grandmother     ALLERGIES:  is allergic to ibuprofen.  MEDICATIONS:  Current Outpatient Medications  Medication Sig Dispense Refill   allopurinol (ZYLOPRIM) 300 MG tablet Take 300 mg by mouth daily.     aspirin 81 MG chewable tablet Chew 81 mg by mouth daily.     atorvastatin (LIPITOR) 20 MG tablet Take 20 mg by mouth daily.     clonazePAM (KLONOPIN) 0.5 MG tablet 1 tablet in the morning, 2 in the evening 90 tablet 3   hydrocortisone 2.5 % cream Apply 1 application topically daily as needed (for itching).      ketoconazole (NIZORAL) 2 % cream Apply 1 application topically daily as needed for irritation.      lidocaine (LIDODERM) 5 % Place 1 patch onto the skin every 12 (twelve) hours. Remove & Discard patch within 12 hours or as directed by MD 10 patch 0   lisinopril (ZESTRIL) 20 MG tablet Take 20 mg by mouth daily.     metoprolol succinate (TOPROL-XL) 25 MG 24 hr tablet       Multiple Vitamins-Minerals (CENTRUM SILVER PO) Take 1 tablet by mouth daily.     ondansetron (ZOFRAN) 8 MG tablet Take 1 tablet (8 mg total) by mouth every 8 (eight) hours as needed for nausea, vomiting or refractory nausea / vomiting. 30 tablet 0   No current facility-administered medications for this visit.      PHYSICAL EXAMINATION: ECOG PERFORMANCE STATUS: 1 - Symptomatic but completely ambulatory Vitals:   02/27/19 1023  BP: 123/63  Pulse: 66  Temp: 98.4 F (36.9 C)   Filed Weights   02/27/19 1023  Weight: 232 lb 9.6 oz (105.5 kg)   RN Janeann Merl presents for chaperone during physical examination. Physical Exam Constitutional:      General: He is not in acute distress. HENT:     Head: Normocephalic  and atraumatic.  Eyes:     General: No scleral icterus.    Pupils: Pupils are equal, round, and reactive to light.  Neck:     Musculoskeletal: Normal range of motion and neck supple.  Cardiovascular:     Rate and Rhythm: Normal rate and regular rhythm.     Heart sounds: Normal heart sounds.  Pulmonary:     Effort: Pulmonary effort is normal. No respiratory distress.     Breath sounds: No wheezing.  Abdominal:     General: Bowel sounds are normal. There is no distension.     Palpations: Abdomen is soft. There is no mass.     Tenderness: There is no abdominal tenderness.     Comments: Large keft inguinal palpable mass  Musculoskeletal: Normal range of motion.        General: No deformity.  Skin:    General: Skin is warm and dry.     Findings: No erythema or rash.  Neurological:     Mental Status: He is alert and oriented to person, place, and time.     Cranial Nerves: No cranial nerve deficit.     Coordination: Coordination normal.  Psychiatric:        Behavior: Behavior normal.        Thought Content: Thought content normal.   Declined testicle examination. LABORATORY DATA:  I have reviewed the data as listed Lab Results  Component Value Date    WBC 7.8 02/03/2019   HGB 10.6 (L) 02/03/2019   HCT 31.8 (L) 02/03/2019   MCV 87.6 02/03/2019   PLT 286 02/03/2019   Recent Labs    08/25/18 0436 08/25/18 0936  08/28/18 0355 01/31/19 0925 02/03/19 1144  NA 140 140   < > 137 139 137  K 4.3 4.6   < > 4.7 4.5 4.0  CL 101 102   < > 104 106 101  CO2 29 27   < > '24 25 26  '$ GLUCOSE 127* 120*   < > 103* 85 128*  BUN 37* 46*   < > 22* 24* 26*  CREATININE 2.46* 2.94*   < > 1.36* 1.16 1.46*  CALCIUM 9.1 9.6   < > 8.9 9.1 9.5  GFRNONAA 28* 22*   < > 57* >60 52*  GFRAA 32* 26*   < > >60 >60 >60  PROT  --  6.6  --   --   --  8.1  ALBUMIN 3.8 3.6  --   --   --  4.1  AST  --  29  --   --   --  21  ALT  --  51*  --   --   --  22  ALKPHOS  --  70  --   --   --  79  BILITOT  --  0.9  --   --   --  0.6   < > = values in this interval not displayed.   Iron/TIBC/Ferritin/ %Sat No results found for: IRON, TIBC, FERRITIN, IRONPCTSAT    RADIOGRAPHIC STUDIES: I have personally reviewed the radiological images as listed and agreed with the findings in the report.  Nm Pet Image Initial (pi) Skull Base To Thigh  Result Date: 02/11/2019 CLINICAL DATA:  Initial treatment strategy for inguinal mass. Remote history of left lower extremity melanoma (2012). EXAM: NUCLEAR MEDICINE PET SKULL BASE TO THIGH TECHNIQUE: 12.08 mCi F-18 FDG was injected intravenously. Full-ring PET imaging was performed from the skull base to thigh after  the radiotracer. CT data was obtained and used for attenuation correction and anatomic localization. Fasting blood glucose: 100 mg/dl COMPARISON:  CT scan 01/31/2019 FINDINGS: Mediastinal blood pool activity: SUV max 2.65 Liver activity: SUV max NA NECK: No hypermetabolic lymph nodes in the neck. Incidental CT findings: none CHEST: There are scattered bilateral axillary lymph nodes which demonstrate normal architecture with fatty hila. No hypermetabolism to suggest metastatic lymphadenopathy. No supraclavicular adenopathy. No enlarged  or hypermetabolic mediastinal or hilar lymph nodes. No worrisome pulmonary lesions or hypermetabolic lesions to suggest neoplasm. Incidental CT findings: Pacer wires in good position. Scattered aortic and coronary artery calcifications. ABDOMEN/PELVIS: No abnormal hypermetabolic activity within the liver, pancreas, adrenal glands, or spleen. No hypermetabolic lymph nodes in the abdomen. Small low-attenuation lesion in the right hepatic lobe on image number 131/3 measures 6.8 Hounsfield units and is consistent with a benign hepatic cyst. Large left inguinal mass measuring a maximum of 11.5 cm is markedly hypermetabolic with SUV max of 62.2. Findings consistent with neoplasm and would certainly be concerned about recurrent melanoma. There are several small adjacent scattered lymph nodes which are weakly hypermetabolic. No right-sided inguinal adenopathy. 7.5 mm left operator lymph node is not hypermetabolic. 7.5 mm lateral external iliac lymph node on image 234/3 is not hypermetabolic. 7.5 mm lateral left external iliac lymph node on image 230/3 is mildly hypermetabolic with SUV max of 3.0. Incidental CT findings: Moderate atherosclerotic calcifications involving the aorta and iliac arteries but no aneurysm. Moderate-sized periumbilical abdominal wall hernia containing fat. SKELETON: No focal hypermetabolic activity to suggest skeletal metastasis. Incidental CT findings: none IMPRESSION: 1. 11.5 cm left inguinal soft tissue mass is markedly hypermetabolic and consistent with neoplasm. Recurrent/metastatic melanoma is certainly a strong consideration. Lymphoma would be another possibility. Small adjacent lateral external iliac lymph nodes are weakly hypermetabolic but no definite pathologic adenopathy in the chest or abdomen. 2. No findings suspicious for metastatic pulmonary nodules. Electronically Signed   By: Marijo Sanes M.D.   On: 02/11/2019 12:32   [place] Lt Lower Extrem Ltd Soft Tissue Non Vascular  Result  Date: 01/31/2019 CLINICAL DATA:  Left groin mass. EXAM: ULTRASOUND left LOWER EXTREMITY LIMITED TECHNIQUE: Ultrasound examination of the lower extremity soft tissues was performed in the area of clinical concern. COMPARISON:  None FINDINGS: Limited ultrasound imaging of the left groin was performed displaying a heterogeneous masslike area with lobulated contours measuring 11.2 x 7.5 x 6.5 cm. Internal flow is appreciated on color Doppler imaging. No surrounding fluid. This appears to be anterior to femoral vasculature. IMPRESSION: Large mass in the left groin with internal flow. Anterior to vessels in the left groin following vascular access in June. Nodal mass is favored. Suggest CT angiogram of this area to assess for other areas of nodal enlargement and to exclude vascular injury with chronic sequela. Electronically Signed   By: Zetta Bills M.D.   On: 01/31/2019 08:39   [place] Core Biopsy (soft Tissue)  Result Date: 02/12/2019 CLINICAL DATA:  History of left calf melanoma. Left inguinal adenopathy. EXAM: ULTRASOUND GUIDED CORE BIOPSY OF LEFT INGUINAL ADENOPATHY MEDICATIONS: Intravenous Fentanyl 159mg and Versed '2mg'$  were administered as conscious sedation during continuous monitoring of the patient's level of consciousness and physiological / cardiorespiratory status by the radiology RN, with a total moderate sedation time of 11 minutes. PROCEDURE: The procedure, risks, benefits, and alternatives were explained to the patient. Questions regarding the procedure were encouraged and answered. The patient understands and consents to the procedure. Survey ultrasound of the left  groin performed. The enlarged inguinal node/mass was localized and an appropriate site was determined and marked. The operative field was prepped with chlorhexidine in a sterile fashion, and a sterile drape was applied covering the operative field. A sterile gown and sterile gloves were used for the procedure. Local anesthesia was provided  with 1% Lidocaine. Under real-time ultrasound guidance, a 17 gauge trocar needle was advanced to the margin of the lesion. Once needle tip position was confirmed, coaxial 18-gauge core biopsy samples were obtained, submitted in saline to surgical pathology. The guide needle was removed. The patient tolerated the procedure well. COMPLICATIONS: None. FINDINGS: Vascular left inguinal mass was localized. Representative core biopsy samples obtained as above IMPRESSION: 1. Technically successful ultrasound-guided core biopsy, left inguinal adenopathy/mass. Electronically Signed   By: Lucrezia Europe M.D.   On: 02/12/2019 11:37   Ct Angio Abd/pel W/ And/or W/o  Result Date: 01/31/2019 CLINICAL DATA:  58 year old male with a history of left inguinal mass EXAM: CTA ABDOMEN AND PELVIS WITHOUT AND WITH CONTRAST TECHNIQUE: Multidetector CT imaging of the abdomen and pelvis was performed using the standard protocol during bolus administration of intravenous contrast. Multiplanar reconstructed images and MIPs were obtained and reviewed to evaluate the vascular anatomy. CONTRAST:  143m OMNIPAQUE IOHEXOL 350 MG/ML SOLN COMPARISON:  None. FINDINGS: VASCULAR Aorta: Unremarkable course, caliber, contour of the abdominal aorta. No dissection, aneurysm, or periaortic fluid. Minimal atherosclerosis. Celiac: Patent, with no significant atherosclerotic changes. SMA: Patent, with no significant atherosclerotic changes. Renals: Renal arteries are patent. IMA: Inferior mesenteric artery is patent. Right lower extremity: Unremarkable course, caliber, and contour of the right iliac system. No aneurysm, dissection, or occlusion. Hypogastric artery is patent. Common femoral artery patent. Proximal SFA and profunda femoris patent. Left lower extremity: Unremarkable course, caliber, and contour of the left iliac system. No aneurysm, dissection, or occlusion. Hypogastric artery is patent. Common femoral artery patent. Proximal SFA and profunda  femoris patent. Veins: Unremarkable appearance of the venous system. Review of the MIP images confirms the above findings. NON-VASCULAR Lower chest: No acute. Hepatobiliary: Small hypodense lesion in the lateral right liver measures 9 mm on image 45 of series 4. Additional hypodense lesion at the inferior right liver measures 8 mm. Unremarkable gall bladder. Pancreas: Unremarkable. Spleen: Unremarkable. Adrenals/Urinary Tract: Unremarkable appearance of adrenal glands. Right: No hydronephrosis. Symmetric perfusion to the left. No nephrolithiasis. Unremarkable course of the right ureter. Left: No hydronephrosis. Symmetric perfusion to the right. No nephrolithiasis. Unremarkable course of the left ureter. Unremarkable appearance of the urinary bladder . Stomach/Bowel: Hiatal hernia with otherwise unremarkable stomach unremarkable appearance of small bowel. No evidence of obstruction. Normal appendix. Colonic diverticula without associated inflammatory changes. Lymphatic: Heterogeneously attenuating/enhancing mass within the superficial left inguinal region/proximal thigh. Greatest axial dimension of 11.6 cm. AP dimension estimated 7.4 cm. Fat plane maintained between the proximal thigh musculature and the lesion. There are additional small nodules/lymph nodes within the inguinal region with edema in the adjacent fat. Iliac nodes including both internal and external iliac nodes borderline enlarged. Suspicious by number. Mesenteric: No free fluid or air. No mesenteric adenopathy. Reproductive: Unremarkable appearance of the pelvic organs. Other: Fat containing umbilical hernia. Musculoskeletal: No acute displaced fracture. No aggressive lytic or sclerotic lesions. IMPRESSION: No acute CT finding. Left inguinal mass with diameter as large as 11.6 cm. Leading differential would be lymphoma, although other malignancy such as mesenchymal tumors/sarcoma could have this appearance. There are associated left inguinal and left  iliac lymph nodes which are suspicious for involvement.  Referral for oncologic workup is indicated. There are 2 small nonspecific hypodense lesions within the right liver identified, nonspecific. Correlation with either PET-CT or contrast-enhanced liver MR may be considered. Aortic Atherosclerosis (ICD10-I70.0). Electronically Signed   By: Corrie Mckusick D.O.   On: 01/31/2019 10:43      ASSESSMENT & PLAN:  1. Malignant melanoma, unspecified site ([place])   2. Goals of care, counseling/discussion    #Left inguinal nodal recurrence of melanoma. Biopsy results were discussed.  Consistent with recurrence of melanoma.  PET scan was independently reviewed by me and discussed with patient. No distant metastasis.Goal of care is curative.  We have contacted cardiologist office who informed that patient's pacemaker is MRI compatible.  Patient will be scheduled for MRI brain to complete staging.  Case was discussed on tumor board.  Consensus regional surgical resection followed by radiation and systemic therapy. I have sent out molecular testing for BRAF mutation. I discussed about referring patient to tertiary center for evaluation by oncology surgeon.  Patient prefers to stay locally at [facility], or [facility] or [facility] if he has to. Given the aggressive features of melanoma, I will send urgent referral to Dr[facility] for surgery evaluation.   Orders Placed This Encounter  Procedures   MR Brain W Wo Contrast    Standing Status:   Future    Standing Expiration Date:   02/27/2020    Order Specific Question:   If indicated for the ordered procedure, I authorize the administration of contrast media per Radiology protocol    Answer:   Yes    Order Specific Question:   What is the patient's sedation requirement?    Answer:   No Sedation    Order Specific Question:   Does the patient have a pacemaker or implanted devices?    Answer:   Yes    Order Specific Question:   Use SRS Protocol?    Answer:    No    Order Specific Question:   Radiology Contrast Protocol - do NOT remove file path    Answer:   [email or web address]    Order Specific Question:   Preferred imaging location?    Answer:   [facility] (table limit-500 lbs)    All questions were answered. The patient knows to call the clinic with any problems questions or concerns.  Cc [name], [name], MD   Return of visit: to be determined.  We spent sufficient time to discuss many aspect of care, questions were answered to patient's satisfaction. Total face to face encounter time for this patient visit was 40 min. >50% of the time was  spent in counseling and coordination of care.   [name], MD, PhD Hematology Oncology [facility] Pager- [phone] 03/01/2019

## 2019-03-04 NOTE — Telephone Encounter (Signed)
MRI has been scheduled at [facility] on 12/14.

## 2019-03-07 ENCOUNTER — Ambulatory Visit (HOSPITAL_COMMUNITY)

## 2019-03-10 ENCOUNTER — Other Ambulatory Visit: Payer: Self-pay

## 2019-03-10 ENCOUNTER — Ambulatory Visit (HOSPITAL_COMMUNITY)
Admission: RE | Admit: 2019-03-10 | Discharge: 2019-03-10 | Disposition: A | Discharge status: Home / Self Care | Payer: BC Managed Care – PPO | Source: Ambulatory Visit | Attending: Oncology | Admitting: Oncology

## 2019-03-10 DIAGNOSIS — C439 Malignant melanoma of skin, unspecified: Secondary | ICD-10-CM | POA: Insufficient documentation

## 2019-03-10 MED ORDER — GADOBUTROL 1 MMOL/ML IV SOLN
10.0000 mL | Freq: Once | INTRAVENOUS | Status: AC | PRN
  Administered 2019-03-10: 10 mL via INTRAVENOUS

## 2019-03-10 NOTE — Progress Notes (Signed)
Informed of MRI for today.   Device system confirmed to be MRI conditional, with implant date > 6 weeks ago and no evidence of abandoned or epicardial leads in review of most recent CXR Interrogation from today reviewed, pt is currently AS-VP at 73 bpm Change device settings for MRI to DOO at 90 bpm  Program device back to pre-MRI settings after completion of exam.  Shirley Friar, PA-C  03/10/2019 1:42 PM

## 2019-03-11 ENCOUNTER — Telehealth: Payer: Self-pay | Admitting: *Deleted

## 2019-03-11 ENCOUNTER — Other Ambulatory Visit: Payer: Self-pay | Admitting: Oncology

## 2019-03-11 MED ORDER — TRAMADOL HCL 50 MG PO TABS: 50.0000 mg | ORAL_TABLET | Freq: Four times a day (QID) | ORAL | 0 refills | Status: DC | PRN

## 2019-03-11 NOTE — Telephone Encounter (Signed)
Patient called requesting something for pain he is having from the cancer going down his leg. He states Tylenol is not helping. Please advise

## 2019-03-11 NOTE — Telephone Encounter (Signed)
I sent tramadol to his pharmacy

## 2019-03-12 NOTE — Telephone Encounter (Signed)
Left message on patient voice mail that prescription was sent to pharmacy

## 2019-03-19 NOTE — Telephone Encounter (Signed)
Called Dr. Rudene Re office and the patient r/s the 03/19/19 appt to 03/31/2019.

## 2019-03-20 ENCOUNTER — Other Ambulatory Visit: Payer: Self-pay | Admitting: Oncology

## 2019-03-20 MED ORDER — ONDANSETRON HCL 8 MG PO TABS: 8.0000 mg | ORAL_TABLET | Freq: Three times a day (TID) | ORAL | 0 refills | Status: DC | PRN

## 2019-03-28 DIAGNOSIS — C763 Malignant neoplasm of pelvis: Secondary | ICD-10-CM

## 2019-03-28 HISTORY — DX: Malignant neoplasm of pelvis: C76.3

## 2019-04-12 ENCOUNTER — Other Ambulatory Visit (HOSPITAL_COMMUNITY)
Admission: RE | Admit: 2019-04-12 | Discharge: 2019-04-12 | Disposition: A | Discharge status: Home / Self Care | Payer: BC Managed Care – PPO | Source: Ambulatory Visit | Attending: General Surgery | Admitting: General Surgery

## 2019-04-12 DIAGNOSIS — Z01812 Encounter for preprocedural laboratory examination: Secondary | ICD-10-CM | POA: Diagnosis present

## 2019-04-12 DIAGNOSIS — Z20822 Contact with and (suspected) exposure to covid-19: Secondary | ICD-10-CM | POA: Insufficient documentation

## 2019-04-13 ENCOUNTER — Other Ambulatory Visit: Payer: Self-pay | Admitting: Oncology

## 2019-04-13 LAB — NOVEL CORONAVIRUS, NAA (HOSP ORDER, SEND-OUT TO REF LAB; TAT 18-24 HRS): SARS-CoV-2, NAA: NOT DETECTED

## 2019-04-14 ENCOUNTER — Other Ambulatory Visit: Payer: Self-pay | Admitting: Oncology

## 2019-04-14 ENCOUNTER — Encounter (HOSPITAL_COMMUNITY): Payer: Self-pay

## 2019-04-14 ENCOUNTER — Other Ambulatory Visit: Payer: Self-pay

## 2019-04-14 ENCOUNTER — Encounter (HOSPITAL_COMMUNITY)
Admission: RE | Admit: 2019-04-14 | Discharge: 2019-04-14 | Disposition: A | Discharge status: Home / Self Care | Payer: BC Managed Care – PPO | Source: Ambulatory Visit | Attending: General Surgery | Admitting: General Surgery

## 2019-04-14 DIAGNOSIS — Z950 Presence of cardiac pacemaker: Secondary | ICD-10-CM | POA: Insufficient documentation

## 2019-04-14 DIAGNOSIS — Z01818 Encounter for other preprocedural examination: Secondary | ICD-10-CM | POA: Insufficient documentation

## 2019-04-14 DIAGNOSIS — E785 Hyperlipidemia, unspecified: Secondary | ICD-10-CM | POA: Insufficient documentation

## 2019-04-14 DIAGNOSIS — I442 Atrioventricular block, complete: Secondary | ICD-10-CM | POA: Diagnosis not present

## 2019-04-14 DIAGNOSIS — I2510 Atherosclerotic heart disease of native coronary artery without angina pectoris: Secondary | ICD-10-CM | POA: Diagnosis not present

## 2019-04-14 DIAGNOSIS — I129 Hypertensive chronic kidney disease with stage 1 through stage 4 chronic kidney disease, or unspecified chronic kidney disease: Secondary | ICD-10-CM | POA: Diagnosis not present

## 2019-04-14 DIAGNOSIS — D649 Anemia, unspecified: Secondary | ICD-10-CM | POA: Diagnosis not present

## 2019-04-14 DIAGNOSIS — F419 Anxiety disorder, unspecified: Secondary | ICD-10-CM | POA: Insufficient documentation

## 2019-04-14 DIAGNOSIS — N189 Chronic kidney disease, unspecified: Secondary | ICD-10-CM | POA: Insufficient documentation

## 2019-04-14 HISTORY — DX: Presence of cardiac pacemaker: Z95.0

## 2019-04-14 HISTORY — DX: Family history of other specified conditions: Z84.89

## 2019-04-14 HISTORY — DX: Unspecified osteoarthritis, unspecified site: M19.90

## 2019-04-14 LAB — COMPREHENSIVE METABOLIC PANEL
ALT: 30 U/L (ref 0–44)
AST: 25 U/L (ref 15–41)
Albumin: 3.7 g/dL (ref 3.5–5.0)
Alkaline Phosphatase: 83 U/L (ref 38–126)
Anion gap: 11 (ref 5–15)
BUN: 18 mg/dL (ref 6–20)
CO2: 24 mmol/L (ref 22–32)
Calcium: 9.5 mg/dL (ref 8.9–10.3)
Chloride: 104 mmol/L (ref 98–111)
Creatinine, Ser: 1.13 mg/dL (ref 0.61–1.24)
GFR calc Af Amer: >60 mL/min (ref >60)
GFR calc non Af Amer: >60 mL/min (ref >60)
Glucose, Bld: 100 mg/dL — ABNORMAL HIGH (ref 70–99)
Potassium: 4.7 mmol/L (ref 3.5–5.1)
Sodium: 139 mmol/L (ref 135–145)
Total Bilirubin: 0.6 mg/dL (ref 0.3–1.2)
Total Protein: 7 g/dL (ref 6.5–8.1)

## 2019-04-14 LAB — CBC
HCT: 34.5 % — ABNORMAL LOW (ref 39.0–52.0)
Hemoglobin: 11 g/dL — ABNORMAL LOW (ref 13.0–17.0)
MCH: 29 pg (ref 26.0–34.0)
MCHC: 31.9 g/dL (ref 30.0–36.0)
MCV: 91 fL (ref 80.0–100.0)
Platelets: 328 10*3/uL (ref 150–400)
RBC: 3.79 MIL/uL — ABNORMAL LOW (ref 4.22–5.81)
RDW: 13 % (ref 11.5–15.5)
WBC: 6.4 10*3/uL (ref 4.0–10.5)
nRBC: 0 % (ref 0.0–0.2)

## 2019-04-14 MED ORDER — ONDANSETRON HCL 8 MG PO TABS: 8.0000 mg | ORAL_TABLET | Freq: Three times a day (TID) | ORAL | 0 refills | Status: DC | PRN

## 2019-04-14 NOTE — Progress Notes (Deleted)
PCP - Norberto Sorenson MD Cardiologist - Nehemiah Massed MD  PPM/ICD - yes - medtronic pacemaker  Device Orders - [employer] for device orders 04/14/19 0930 Rep Notified -   Chest x-ray - 08/26/18 EKG - 04/07/19 - requested from [facility] clinic Stress Test - 06/21/17 ECHO - 08/26/18 Cardiac Cath - 06/29/17      Aspirin Instructions: per pt, he stopped taking ASA per doctors orders (5 days prior to surgery)  COVID TEST- yes - negative results   Anesthesia review: yes - cardiac history  Patient denies shortness of breath, fever, cough and chest pain at PAT appointment   All instructions explained to the patient, with a verbal understanding of the material. Patient agrees to go over the instructions while at home for a better understanding. Patient also instructed to self quarantine after being tested for COVID-19. The opportunity to ask questions was provided.

## 2019-04-14 NOTE — Progress Notes (Signed)
Patient has a Medtronic pacemaker. [facility] for device orders.

## 2019-04-14 NOTE — Progress Notes (Signed)
[employer] Dr. Barry Dienes for procedure orders.

## 2019-04-14 NOTE — Telephone Encounter (Signed)
Please check when is he going to have surgery and he can have a follow up visit with me to discuss pathology 1week after his surgery. Thank you. Ok to refill Zofran

## 2019-04-14 NOTE — Pre-Procedure Instructions (Signed)
Your procedure is scheduled on Wednesday, January 20th, from 1 PM to 4 PM.  Report to [facility] Main Entrance "A" at 11:00 A.M., and check in at the Admitting office.  Call this number if you have problems the morning of surgery:  [phone]  Call [phone] if you have any questions prior to your surgery date Monday-Friday 8am-4pm.    Remember:  Do not eat or drink after midnight the night before your surgery.     Take these medicines the morning of surgery with A SIP OF WATER : allopurinol (ZYLOPRIM) atorvastatin (LIPITOR) metoprolol succinate (TOPROL-XL)  acetaminophen (TYLENOL)- if needed clonazePAM (KLONOPIN)- if needed ondansetron [facility]) - if needed  Follow your surgeon's instructions on when to stop Aspirin.  If no instructions were given by your surgeon then you will need to call the office to get those instructions.     As of today, STOP taking any Aleve, Naproxen, Ibuprofen, Motrin, Advil, Goody's, BC's, all herbal medications, fish oil, and all vitamins.    The Morning of Surgery  Do not wear jewelry.  Do not wear lotions, powders, colognes, or deodorant.  Men may shave face and neck.  Do not bring valuables to the hospital.  [facility] is not responsible for any belongings or valuables.  If you are a smoker, DO NOT Smoke 24 hours prior to surgery  If you wear a CPAP at night please bring your mask the morning of surgery   Remember that you must have someone to transport you home after your surgery, and remain with you for 24 hours if you are discharged the same day.   Please bring cases for contacts, glasses, hearing aids, dentures or bridgework because it cannot be worn into surgery.    Leave your suitcase in the car.  After surgery it may be brought to your room.  For patients admitted to the hospital, discharge time will be determined by your treatment team.  Patients discharged the day of surgery will not be allowed to drive home.     Special instructions:   [place]- Preparing For Surgery  Before surgery, you can play an important role. Because skin is not sterile, your skin needs to be as free of germs as possible. You can reduce the number of germs on your skin by washing with CHG (chlorahexidine gluconate) Soap before surgery.  CHG is an antiseptic cleaner which kills germs and bonds with the skin to continue killing germs even after washing.    Oral Hygiene is also important to reduce your risk of infection.  Remember - BRUSH YOUR TEETH THE MORNING OF SURGERY WITH YOUR REGULAR TOOTHPASTE  Please do not use if you have an allergy to CHG or antibacterial soaps. If your skin becomes reddened/irritated stop using the CHG.  Do not shave (including legs and underarms) for at least 48 hours prior to first CHG shower. It is OK to shave your face.  Please follow these instructions carefully.   1. Shower the NIGHT BEFORE SURGERY and the MORNING OF SURGERY with CHG Soap.   2. If you chose to wash your hair, wash your hair first as usual with your normal shampoo.  3. After you shampoo, rinse your hair and body thoroughly to remove the shampoo.  4. Use CHG as you would any other liquid soap. You can apply CHG directly to the skin and wash gently with a scrungie or a clean washcloth.   5. Apply the CHG Soap to your body ONLY  FROM THE NECK DOWN.  Do not use on open wounds or open sores. Avoid contact with your eyes, ears, mouth and genitals (private parts). Wash Face and genitals (private parts)  with your normal soap.   6. Wash thoroughly, paying special attention to the area where your surgery will be performed.  7. Thoroughly rinse your body with warm water from the neck down.  8. DO NOT shower/wash with your normal soap after using and rinsing off the CHG Soap.  9. Pat yourself dry with a CLEAN TOWEL.  10. Wear CLEAN PAJAMAS to bed the night before surgery, wear comfortable clothes the morning of  surgery  11. Place CLEAN SHEETS on your bed the night of your first shower and DO NOT SLEEP WITH PETS.    Day of Surgery:   Remember to brush your teeth WITH YOUR REGULAR TOOTHPASTE. Please shower the morning of surgery with the CHG soap Do not apply any deodorants/lotions. Please wear clean clothes to the hospital/surgery center.      Please read over the following fact sheets that you were given.

## 2019-04-14 NOTE — Progress Notes (Signed)
Your procedure is scheduled on Wednesday, January 20th, from 1 PM to 4 PM.  Report to [facility] Main Entrance "A" at 11:00 A.M., and check in at the Admitting office.  Call this number if you have problems the morning of surgery:  [phone]   Call [phone] if you have any questions prior to your surgery date Monday-Friday 8am-4pm.    Remember:  Do not eat or drink after midnight the night before your surgery.     Take these medicines the morning of surgery with A SIP OF WATER :  allopurinol (ZYLOPRIM)  atorvastatin (LIPITOR)  metoprolol succinate (TOPROL-XL)   acetaminophen (TYLENOL)- if needed  clonazePAM (KLONOPIN)- if needed  ondansetron [facility]) - if needed  Follow your surgeon's instructions on when to stop Aspirin.  If no instructions were given by your surgeon then you will need to call the office to get those instructions.     As of today, STOP taking any Aleve, Naproxen, Ibuprofen, Motrin, Advil, Goody's, BC's, all herbal medications, fish oil, and all vitamins.    The Morning of Surgery  Do not wear jewelry.  Do not wear lotions, powders, colognes, or deodorant.  Men may shave face and neck.  Do not bring valuables to the hospital.  [facility] is not responsible for any belongings or valuables.  If you are a smoker, DO NOT Smoke 24 hours prior to surgery  If you wear a CPAP at night please bring your mask the morning of surgery   Remember that you must have someone to transport you home after your surgery, and remain with you for 24 hours if you are discharged the same day.   Please bring cases for contacts, glasses, hearing aids, dentures or bridgework because it cannot be worn into surgery.    Leave your suitcase in the car.  After surgery it may be brought to your room.  For patients admitted to the hospital, discharge time will be determined by your treatment team.  Patients discharged the day of surgery will not be allowed to drive home.     Special instructions:   [place]- Preparing For Surgery  Before surgery, you can play an important role. Because skin is not sterile, your skin needs to be as free of germs as possible. You can reduce the number of germs on your skin by washing with CHG (chlorahexidine gluconate) Soap before surgery.  CHG is an antiseptic cleaner which kills germs and bonds with the skin to continue killing germs even after washing.    Oral Hygiene is also important to reduce your risk of infection.  Remember - BRUSH YOUR TEETH THE MORNING OF SURGERY WITH YOUR REGULAR TOOTHPASTE  Please do not use if you have an allergy to CHG or antibacterial soaps. If your skin becomes reddened/irritated stop using the CHG.  Do not shave (including legs and underarms) for at least 48 hours prior to first CHG shower. It is OK to shave your face.  Please follow these instructions carefully.   1. Shower the NIGHT BEFORE SURGERY and the MORNING OF SURGERY with CHG Soap.   2. If you chose to wash your hair, wash your hair first as usual with your normal shampoo.  3. After you shampoo, rinse your hair and body thoroughly to remove the shampoo.  4. Use CHG as you would any other liquid soap. You can apply CHG directly to the skin and wash gently with a scrungie or a clean washcloth.   5. Apply  the CHG Soap to your body ONLY FROM THE NECK DOWN.  Do not use on open wounds or open sores. Avoid contact with your eyes, ears, mouth and genitals (private parts). Wash Face and genitals (private parts)  with your normal soap.   6. Wash thoroughly, paying special attention to the area where your surgery will be performed.  7. Thoroughly rinse your body with warm water from the neck down.  8. DO NOT shower/wash with your normal soap after using and rinsing off the CHG Soap.  9. Pat yourself dry with a CLEAN TOWEL.  10. Wear CLEAN PAJAMAS to bed the night before surgery, wear comfortable clothes the morning of  surgery  11. Place CLEAN SHEETS on your bed the night of your first shower and DO NOT SLEEP WITH PETS.    Day of Surgery:   Remember to brush your teeth WITH YOUR REGULAR TOOTHPASTE. Please shower the morning of surgery with the CHG soap Do not apply any deodorants/lotions. Please wear clean clothes to the hospital/surgery center.      Please read over the following fact sheets that you were given.

## 2019-04-15 ENCOUNTER — Other Ambulatory Visit: Payer: Self-pay | Admitting: General Surgery

## 2019-04-15 NOTE — Progress Notes (Signed)
Anesthesia Chart Review:  Follows with cardiology at [facility] for history of CHB s/p dual chamber pacemaker placement in 07/2018, minimal non-obstructive coronary artery disease as seen on cath from 06/2017 with 35% stenosis to ostial left main and 30% lesion to RCA, HTN, and HLD. Last seen by Dr. Nehemiah Massed 04/07/19 and cleared for surgery. Per note, "The patient is scheduled to undergo left groin mass excision surgery under general anesthesia. 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 Medication regimen has been optimized and the patient is not experiencing any acute symptoms related to their pre-existing cardiac diagnoses. EKG performed in the office today shows no significant changes from prior. The scheduled procedure is low to moderate risk and the patient is considered at lowest possible risk for cardiac complications from sedation, so they may proceed to their scheduled procedure as scheduled. Hold aspirin for 5 days prior to procedure, and resume aspirin again a safe period of time after surgery per surgeon's recommendation."  Device check last completed 11/2018 showing adequate remaining battery life. Patient remains paced ~99% of the time. No significant event reports.   EP device form has been faxed.  Follows with nephrology for CKD. Preop labs show creatinine 1.13. Labs also with mild anemia, Hgb 11.0. Otherwise unremarkable.   Follows with neurology for anxiety. History in Epic states seizures, however review of neurology notes indicate that pt's episodes were likely to be "behavior episodes or panic attacks." He is now on clonazepam.   TTE 08/26/18:  1. The left ventricle has normal systolic function with an ejection fraction of 60-65%. The cavity size was normal. There is mildly increased left ventricular wall thickness. Left ventricular diastolic Doppler parameters are indeterminate.  2. The right ventricle has normal systolic function.  The cavity was normal. There is no increase in right ventricular wall thickness.  3. The aortic valve is tricuspid. Aortic valve regurgitation was not assessed by color flow Doppler.  Nuclear stress 06/21/17 (care everywhere): LVEF= 53% FINDINGS: Regional wall motion:reveals normal myocardial thickening and wall  motion. The overall quality of the study is good. Artifacts noted: no Left ventricular cavity: normal. Perfusion Analysis:SPECT images demonstrate homogeneous tracer  distribution throughout the myocardium.   Wynonia Musty [facility] Short Stay Center/Anesthesiology Phone [phone] 04/15/2019 2:39 PM

## 2019-04-15 NOTE — H&P (Signed)
Edward Pitts Documented: 03/31/2019 2:37 PM Location: [place] Office Patient #: [id number] DOB: 1961-01-02 Married / Language: [name] / Race: White Male   History of Present Illness [name]; 03/31/2019 9:41 PM) The patient is a 59 year old male who presents with malignant melanoma. Pt is a 59 yo M who is referred for consultation by Dr. Tasia Catchings for a new diagnosis of a malignant left groin mass that is biopsy proven metastatic melanoma. The patient presented to medical attention when he passed out and was seen to have complete heart block in May 2020. He received permanent pacemaker. He started having some groin swelling at that point but thought is was related to the left groin stick for the temporary pacemaker. It didn't bother him at first, but started to hurt in november. He went to the ED at that point for leg pain and was seen to have a mass. He was set up to see oncology and got a core needle biopsy which showed melanoma. He had a melanoma excision in [place] in 2012 but did not require lymph node biopsy. This was done at [facility] skin center. PET is negative other than the large mass and some adjacent nodes. He denies weight loss, but has had left leg swelling. The growth in the mass has been much accelerated the last month.   CT angio pelvis/abd 01/31/2019 IMPRESSION: No acute CT finding.  Left inguinal mass with diameter as large as 11.6 cm. Leading differential would be lymphoma, although other malignancy such as mesenchymal tumors/sarcoma could have this appearance. There are associated left inguinal and left iliac lymph nodes which are suspicious for involvement. Referral for oncologic workup is indicated.  There are 2 small nonspecific hypodense lesions within the right liver identified, nonspecific. Correlation with either PET-CT or contrast-enhanced liver MR may be considered.  Aortic Atherosclerosis (ICD10-I70.0).  PET 02/11/2019 IMPRESSION: 1. 11.5 cm  left inguinal soft tissue mass is markedly hypermetabolic and consistent with neoplasm. Recurrent/metastatic melanoma is certainly a strong consideration. Lymphoma would be another possibility. Small adjacent lateral external iliac lymph nodes are weakly hypermetabolic but no definite pathologic adenopathy in the chest or abdomen. 2. No findings suspicious for metastatic pulmonary nodules  MR brain 03/10/19  IMPRESSION: Negative MRI brain with contrast. Negative for metastatic disease  core needle biopsy 02/12/19 DIAGNOSIS: A. LYMPH NODE, LEFT INGUINAL; ULTRASOUND-GUIDED CORE BIOPSY: - METASTATIC MALIGNANT MELANOMA.   Diagnostic Studies History (April Staton, [place]; 03/31/2019 2:37 PM) Colonoscopy  1-5 years ago  Allergies (April Staton, CMA; 03/31/2019 2:41 PM) Advil *ANALGESICS - ANTI-INFLAMMATORY*  Motrin *ANALGESICS - ANTI-INFLAMMATORY*  Ibuprofen *ANALGESICS - ANTI-INFLAMMATORY*  Allergies Reconciled   Medication History (April Staton, CMA; 03/31/2019 2:43 PM) Allopurinol (300MG  Tablet, Oral) Active. Aspirin (81MG  Tablet, Oral) Active. Atorvastatin Calcium (20MG  Tablet, Oral) Active. clonazePAM (0.5MG  Tablet, Oral) Active. Hydrocortisone (2.5% Cream, External) Active. Ketoconazole (2% Cream, External) Active. Lidocaine (5% Cream, External) Active. Lisinopril (20MG  Tablet, Oral) Active. Metoprolol Succinate (25MG  CP24 Sprinkle, Oral) Active. Centrum Silver (Oral) Active. Ondansetron (4MG  Tablet Disint, Oral) Active. traMADol HCl (50MG  Tablet, Oral) Active. Medications Reconciled  Social History (April Staton, CMA; 03/31/2019 2:37 PM) Alcohol use  Remotely quit alcohol use. Caffeine use  Carbonated beverages, Tea. No drug use  Tobacco use  Never smoker.  Family History (April Staton, [place]; 03/31/2019 2:37 PM) Cerebrovascular Accident  Father. Diabetes Mellitus  Father. Hypertension  Father, Mother, Sister.  Other Problems (April Staton, CMA;  03/31/2019 2:37 PM) Arthritis  High blood pressure  Hypercholesterolemia  Melanoma  Review of Systems (April Staton CMA; 03/31/2019 2:37 PM) General Present- Chills. Not Present- Appetite Loss, Fatigue, Fever, Night Sweats, Weight Gain and Weight Loss. Skin Not Present- Change in Wart/Mole, Dryness, Hives, Jaundice, New Lesions, Non-Healing Wounds, Rash and Ulcer. HEENT Present- Wears glasses/contact lenses. Not Present- Earache, Hearing Loss, Hoarseness, Nose Bleed, Oral Ulcers, Ringing in the Ears, Seasonal Allergies, Sinus Pain, Sore Throat, Visual Disturbances and Yellow Eyes. Respiratory Not Present- Bloody sputum, Chronic Cough, Difficulty Breathing, Snoring and Wheezing. Cardiovascular Present- Swelling of Extremities. Not Present- Chest Pain, Difficulty Breathing Lying Down, Leg Cramps, Palpitations, Rapid Heart Rate and Shortness of Breath. Gastrointestinal Present- Nausea. Not Present- Abdominal Pain, Bloating, Bloody Stool, Change in Bowel Habits, Chronic diarrhea, Constipation, Difficulty Swallowing, Excessive gas, Gets full quickly at meals, Hemorrhoids, Indigestion, Rectal Pain and Vomiting. Male Genitourinary Not Present- Blood in Urine, Change in Urinary Stream, Frequency, Impotence, Nocturia, Painful Urination, Urgency and Urine Leakage. Musculoskeletal Present- Joint Pain and Muscle Pain. Not Present- Back Pain, Joint Stiffness, Muscle Weakness and Swelling of Extremities. Neurological Not Present- Decreased Memory, Fainting, Headaches, Numbness, Seizures, Tingling, Tremor, Trouble walking and Weakness. Psychiatric Not Present- Anxiety, Bipolar, Change in Sleep Pattern, Depression, Fearful and Frequent crying. Endocrine Not Present- Cold Intolerance, Excessive Hunger, Hair Changes, Heat Intolerance, Hot flashes and New Diabetes. Hematology Present- Easy Bruising. Not Present- Blood Thinners, Excessive bleeding, Gland problems, HIV and Persistent Infections.  Vitals (April  Staton CMA; 03/31/2019 2:44 PM) 03/31/2019 2:43 PM Weight: 227.25 lb Height: 67in Body Surface Area: 2.13 m Body Mass Index: 35.59 kg/m  Temp.: 98.52F(Tympanic)  Pulse: 74 (Regular)  P.OX: 96% (Room air) BP: 152/72 (Sitting, Left Arm, Standard)       Physical Exam [name]; 03/31/2019 9:32 PM) General Mental Status-Alert. General Appearance-Consistent with stated age. Hydration-Well hydrated. Voice-Normal.  Head and Neck Head-normocephalic, atraumatic with no lesions or palpable masses. Trachea-midline. Thyroid Gland Characteristics - normal size and consistency.  Eye Eyeball - Bilateral-Extraocular movements intact. Sclera/Conjunctiva - Bilateral-No scleral icterus.  Chest and Lung Exam Chest and lung exam reveals -quiet, even and easy respiratory effort with no use of accessory muscles and on auscultation, normal breath sounds, no adventitious sounds and normal vocal resonance. Inspection Chest Wall - Normal. Back - normal. Note: left chest wall pacemaker placement   Cardiovascular Cardiovascular examination reveals -normal heart sounds, regular rate and rhythm with no murmurs and normal pedal pulses bilaterally.  Abdomen Inspection Inspection of the abdomen reveals - No Hernias. Palpation/Percussion Palpation and Percussion of the abdomen reveal - Soft, Non Tender, No Rebound tenderness, No Rigidity (guarding) and No hepatosplenomegaly. Auscultation Auscultation of the abdomen reveals - Bowel sounds normal.  Neurologic Neurologic evaluation reveals -alert and oriented x 3 with no impairment of recent or remote memory. Mental Status-Normal.  Musculoskeletal Global Assessment -Note: no gross deformities.  Normal Exam - Left-Upper Extremity Strength Normal and Lower Extremity Strength Normal. Normal Exam - Right-Upper Extremity Strength Normal and Lower Extremity Strength Normal.  Lymphatic Head &  Neck  General Head & Neck Lymphatics: Bilateral - Description - Normal. Axillary  General Axillary Region: Bilateral - Description - Normal. Tenderness - Non Tender. Femoral & Inguinal  Generalized Femoral & Inguinal Lymphatics: Bilateral - Description - Note: large mobile left groin mass. faint redness overlying mass. +2 pitting edema on left.    Assessment & Plan [name]; 03/31/2019 9:41 PM) MELANOMA METASTATIC TO LYMPH NODE (C43.9) Impression: Pt has large lymph node that is metastatic melanoma. Will discuss patient with Dr. Tasia Catchings. We  could make a case for trying to debulk the mass with neoadjuvant immunotherapy, but that patient is quite symptomatic from it. I would plan excision of the large malignant mass from the groin along with left inguinal lymph node dissection and port placement. I discussed with the patient that he would have permanent leg swelling and would need to work with PT. he is applying for disability which is good.  He has risks of bleeding and significant risk of infection. He will likely have some permanent lymphedema. He may have numbness on his lateral left thigh. I advised that I would keep him overnight for pain control, drain teaching, and PT evaluation post op. I discussed that pain is widely variable. I discussed risk of injury to femoral vessels.  His significant other speaks spanish and will need an interpreter to help with her communication post op while is is out of commission.  Due to the large size of this mass with risk of bleeding and his pacer history, I would do this at [facility] rather than at the day surgery center.  I also advised that he will likely need immunotherapy for a year post op and that I would place a port for this. LEFT GROIN MASS (R19.09) Impression: See above. Large and very vascular. Current Plans You are being scheduled for surgery- Our schedulers will call you.  You should hear from our office's scheduling department within 5  working days about the location, date, and time of surgery. We try to make accommodations for patient's preferences in scheduling surgery, but sometimes the OR schedule or the surgeon's schedule prevents [place] from making those accommodations.  If you have not heard from our office [phone]) in 5 working days, call the office and ask for your surgeon's nurse.  If you have other questions about your diagnosis, plan, or surgery, call the office and ask for your surgeon's nurse.  Advised patient to stop ASA, anticoagulant, blood thinners, and NSAIDs Three (5) days prior to surgery. Pt Education - [facility] Port[facility]   Signed electronically by [name], MD (03/31/2019 9:46 PM)

## 2019-04-15 NOTE — Progress Notes (Signed)
PCP - Norberto Sorenson MD Cardiologist - Nehemiah Massed MD  PPM/ICD - yes - medtronic pacemaker  Device Orders - requested from [facility] 1/19 Rep Notified - yes via email, cc'd Lindsi Forte  Chest x-ray - 08/26/18 EKG - 04/07/19 - requested from [facility] clinic Stress Test - 06/21/17 ECHO - 08/26/18 Cardiac Cath - 06/29/17      Aspirin Instructions: per pt, he stopped taking ASA per doctors orders (5 days prior to surgery)  COVID TEST- yes - negative results   Anesthesia review: yes - cardiac history  Patient denies shortness of breath, fever, cough and chest pain at PAT appointment   All instructions explained to the patient, with a verbal understanding of the material. Patient agrees to go over the instructions while at home for a better understanding. Patient also instructed to self quarantine after being tested for COVID-19. The opportunity to ask questions was provided.

## 2019-04-15 NOTE — Anesthesia Preprocedure Evaluation (Addendum)
Anesthesia Evaluation  Patient identified by MRN, date of birth, ID band Patient awake    Reviewed: Allergy & Precautions, NPO status , Patient's Chart, lab work & pertinent test results  History of Anesthesia Complications Negative for: history of anesthetic complications  Airway Mallampati: IV  TM Distance: >3 FB Neck ROM: Full    Dental  (+) Dental Advisory Given, Partial Upper   Pulmonary neg pulmonary ROS,    Pulmonary exam normal        Cardiovascular hypertension, Pt. on medications and Pt. on home beta blockers Normal cardiovascular exam+ dysrhythmias + pacemaker    '20 TTE - EF 60-65%. Mildly increased left ventricular wall thickness. Left ventricular diastolic Doppler parameters are indeterminate. Mild MR and TR.  '19 Cath - Ost LM to Mid LM lesion is 35% stenosed. Mid RCA lesion is 30% stenosed.  Follows with cardiology at [facility]. Last seen by Dr. Nehemiah Massed 04/07/19 and cleared for surgery. Per note, "The patient is scheduled to undergo left groin mass excision surgery under general anesthesia. 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 Medication regimen has been optimized and the patient is not experiencing any acute symptoms related to their pre-existing cardiac diagnoses. EKG performed in the office today shows no significant changes from prior. The scheduled procedure is low to moderate risk and the patient is considered at lowest possible risk for cardiac complications from sedation, so they may proceed to their scheduled procedure as scheduled. Hold aspirin for 5 days prior to procedure, and resume aspirin again a safe period of time after surgery per surgeon's recommendation."  Device check last completed 11/2018 showing adequate remaining battery life. Patient remains paced ~99% of the time. No significant event reports.     Neuro/Psych Seizures - (per  neurology, likely behavioral related and not true seizures),  PSYCHIATRIC DISORDERS Anxiety    GI/Hepatic negative GI ROS, Neg liver ROS,   Endo/Other   Obesity   Renal/GU negative Renal ROS     Musculoskeletal  (+) Arthritis ,   Abdominal   Peds  Hematology  (+) anemia ,   Anesthesia Other Findings Covid neg 1/16 Melanoma   Reproductive/Obstetrics                           Anesthesia Physical Anesthesia Plan  ASA: III  Anesthesia Plan: General   Post-op Pain Management:    Induction: Intravenous  PONV Risk Score and Plan: 2 and Treatment may vary due to age or medical condition, Ondansetron, Dexamethasone and Midazolam  Airway Management Planned: LMA  Additional Equipment: None  Intra-op Plan:   Post-operative Plan: Extubation in OR  Informed Consent: I have reviewed the patients History and Physical, chart, labs and discussed the procedure including the risks, benefits and alternatives for the proposed anesthesia with the patient or authorized representative who has indicated his/her understanding and acceptance.     Dental advisory given  Plan Discussed with: CRNA and Anesthesiologist  Anesthesia Plan Comments: ()      Anesthesia Quick Evaluation

## 2019-04-16 ENCOUNTER — Observation Stay (HOSPITAL_COMMUNITY)
Admission: RE | Admit: 2019-04-16 | Discharge: 2019-04-17 | Disposition: A | Discharge status: Home / Self Care | Payer: BC Managed Care – PPO | Attending: General Surgery | Admitting: General Surgery

## 2019-04-16 ENCOUNTER — Other Ambulatory Visit: Payer: Self-pay

## 2019-04-16 ENCOUNTER — Encounter (HOSPITAL_COMMUNITY)
Admission: RE | Disposition: A | Discharge status: Home / Self Care | Payer: Self-pay | Source: Home / Self Care | Attending: General Surgery

## 2019-04-16 ENCOUNTER — Encounter (HOSPITAL_COMMUNITY): Payer: Self-pay | Admitting: General Surgery

## 2019-04-16 ENCOUNTER — Ambulatory Visit (HOSPITAL_COMMUNITY): Payer: BC Managed Care – PPO | Admitting: Anesthesiology

## 2019-04-16 ENCOUNTER — Ambulatory Visit (HOSPITAL_COMMUNITY): Payer: BC Managed Care – PPO | Admitting: Physician Assistant

## 2019-04-16 DIAGNOSIS — C774 Secondary and unspecified malignant neoplasm of inguinal and lower limb lymph nodes: Principal | ICD-10-CM | POA: Insufficient documentation

## 2019-04-16 DIAGNOSIS — C439 Malignant melanoma of skin, unspecified: Secondary | ICD-10-CM | POA: Diagnosis present

## 2019-04-16 DIAGNOSIS — Z950 Presence of cardiac pacemaker: Secondary | ICD-10-CM | POA: Insufficient documentation

## 2019-04-16 DIAGNOSIS — Z85820 Personal history of malignant melanoma of skin: Secondary | ICD-10-CM | POA: Insufficient documentation

## 2019-04-16 DIAGNOSIS — Z7982 Long term (current) use of aspirin: Secondary | ICD-10-CM | POA: Diagnosis not present

## 2019-04-16 DIAGNOSIS — R569 Unspecified convulsions: Secondary | ICD-10-CM | POA: Insufficient documentation

## 2019-04-16 DIAGNOSIS — Z6834 Body mass index (BMI) 34.0-34.9, adult: Secondary | ICD-10-CM | POA: Diagnosis not present

## 2019-04-16 DIAGNOSIS — R112 Nausea with vomiting, unspecified: Secondary | ICD-10-CM

## 2019-04-16 DIAGNOSIS — E785 Hyperlipidemia, unspecified: Secondary | ICD-10-CM | POA: Insufficient documentation

## 2019-04-16 DIAGNOSIS — I442 Atrioventricular block, complete: Secondary | ICD-10-CM | POA: Diagnosis not present

## 2019-04-16 DIAGNOSIS — Z79899 Other long term (current) drug therapy: Secondary | ICD-10-CM | POA: Diagnosis not present

## 2019-04-16 DIAGNOSIS — E669 Obesity, unspecified: Secondary | ICD-10-CM | POA: Diagnosis not present

## 2019-04-16 DIAGNOSIS — Z79891 Long term (current) use of opiate analgesic: Secondary | ICD-10-CM | POA: Insufficient documentation

## 2019-04-16 DIAGNOSIS — Z9889 Other specified postprocedural states: Secondary | ICD-10-CM

## 2019-04-16 DIAGNOSIS — E7800 Pure hypercholesterolemia, unspecified: Secondary | ICD-10-CM | POA: Insufficient documentation

## 2019-04-16 DIAGNOSIS — D630 Anemia in neoplastic disease: Secondary | ICD-10-CM | POA: Diagnosis not present

## 2019-04-16 DIAGNOSIS — F419 Anxiety disorder, unspecified: Secondary | ICD-10-CM | POA: Diagnosis not present

## 2019-04-16 DIAGNOSIS — I10 Essential (primary) hypertension: Secondary | ICD-10-CM | POA: Insufficient documentation

## 2019-04-16 DIAGNOSIS — C779 Secondary and unspecified malignant neoplasm of lymph node, unspecified: Secondary | ICD-10-CM | POA: Diagnosis present

## 2019-04-16 HISTORY — DX: Other specified postprocedural states: R11.2

## 2019-04-16 HISTORY — DX: Other specified postprocedural states: Z98.890

## 2019-04-16 HISTORY — PX: MELANOMA EXCISION: SHX5266

## 2019-04-16 HISTORY — PX: LYMPH NODE DISSECTION: SHX5087

## 2019-04-16 HISTORY — DX: Other complications of anesthesia, initial encounter: T88.59XA

## 2019-04-16 LAB — CBC
HCT: 30.1 % — ABNORMAL LOW (ref 39.0–52.0)
Hemoglobin: 9.8 g/dL — ABNORMAL LOW (ref 13.0–17.0)
MCH: 28.9 pg (ref 26.0–34.0)
MCHC: 32.6 g/dL (ref 30.0–36.0)
MCV: 88.8 fL (ref 80.0–100.0)
Platelets: 281 10*3/uL (ref 150–400)
RBC: 3.39 MIL/uL — ABNORMAL LOW (ref 4.22–5.81)
RDW: 13.2 % (ref 11.5–15.5)
WBC: 8.8 10*3/uL (ref 4.0–10.5)
nRBC: 0 % (ref 0.0–0.2)

## 2019-04-16 LAB — CREATININE, SERUM
Creatinine, Ser: 1.73 mg/dL — ABNORMAL HIGH (ref 0.61–1.24)
GFR calc Af Amer: 49 mL/min — ABNORMAL LOW (ref >60)
GFR calc non Af Amer: 42 mL/min — ABNORMAL LOW (ref >60)

## 2019-04-16 SURGERY — EXCISION, MELANOMA
Anesthesia: General | Site: Groin

## 2019-04-16 MED ORDER — SODIUM CHLORIDE 0.9 % IV SOLN
INTRAVENOUS | Status: AC
  Filled 2019-04-16: qty 1.2

## 2019-04-16 MED ORDER — ONDANSETRON HCL 4 MG/2ML IJ SOLN: 4.0000 mg | Freq: Four times a day (QID) | INTRAMUSCULAR | Status: DC | PRN

## 2019-04-16 MED ORDER — SODIUM CHLORIDE 0.9 % IV SOLN
INTRAVENOUS | Status: DC | PRN
  Administered 2019-04-16: 500 mL

## 2019-04-16 MED ORDER — MIDAZOLAM HCL 2 MG/2ML IJ SOLN
INTRAMUSCULAR | Status: AC
  Filled 2019-04-16: qty 2

## 2019-04-16 MED ORDER — DIPHENHYDRAMINE HCL 50 MG/ML IJ SOLN: 12.5000 mg | Freq: Four times a day (QID) | INTRAMUSCULAR | Status: DC | PRN

## 2019-04-16 MED ORDER — LISINOPRIL 20 MG PO TABS
20.0000 mg | ORAL_TABLET | Freq: Every day | ORAL | Status: DC
  Administered 2019-04-17: 20 mg via ORAL
  Filled 2019-04-16: qty 1

## 2019-04-16 MED ORDER — SODIUM CHLORIDE 0.9 % IV SOLN
INTRAVENOUS | Status: AC
  Filled 2019-04-16: qty 500000

## 2019-04-16 MED ORDER — PROPOFOL 10 MG/ML IV BOLUS
INTRAVENOUS | Status: DC | PRN
  Administered 2019-04-16: 140 mg via INTRAVENOUS
  Administered 2019-04-16: 30 mg via INTRAVENOUS

## 2019-04-16 MED ORDER — CEFAZOLIN SODIUM-DEXTROSE 2-4 GM/100ML-% IV SOLN
INTRAVENOUS | Status: AC
  Filled 2019-04-16: qty 100

## 2019-04-16 MED ORDER — FENTANYL CITRATE (PF) 100 MCG/2ML IJ SOLN: 25.0000 ug | INTRAMUSCULAR | Status: DC | PRN

## 2019-04-16 MED ORDER — BUPIVACAINE HCL (PF) 0.25 % IJ SOLN
INTRAMUSCULAR | Status: AC
  Filled 2019-04-16: qty 10

## 2019-04-16 MED ORDER — OXYCODONE HCL 5 MG PO TABS
5.0000 mg | ORAL_TABLET | ORAL | Status: DC | PRN
  Administered 2019-04-16 – 2019-04-17 (×2): 10 mg via ORAL
  Filled 2019-04-16 (×2): qty 2

## 2019-04-16 MED ORDER — BUPIVACAINE LIPOSOME 1.3 % IJ SUSP
20.0000 mL | INTRAMUSCULAR | Status: AC
  Administered 2019-04-16: 20 mL
  Filled 2019-04-16: qty 20

## 2019-04-16 MED ORDER — CEFAZOLIN SODIUM-DEXTROSE 2-4 GM/100ML-% IV SOLN
2.0000 g | Freq: Three times a day (TID) | INTRAVENOUS | Status: AC
  Administered 2019-04-16: 22:00:00 2 g via INTRAVENOUS
  Filled 2019-04-16: qty 100

## 2019-04-16 MED ORDER — MIDAZOLAM HCL 2 MG/2ML IJ SOLN
INTRAMUSCULAR | Status: DC | PRN
  Administered 2019-04-16: 2 mg via INTRAVENOUS

## 2019-04-16 MED ORDER — 0.9 % SODIUM CHLORIDE (POUR BTL) OPTIME
TOPICAL | Status: DC | PRN
  Administered 2019-04-16: 1000 mL

## 2019-04-16 MED ORDER — DEXAMETHASONE SODIUM PHOSPHATE 10 MG/ML IJ SOLN
INTRAMUSCULAR | Status: DC | PRN
  Administered 2019-04-16: 10 mg via INTRAVENOUS

## 2019-04-16 MED ORDER — ONDANSETRON HCL 4 MG PO TABS: 8.0000 mg | ORAL_TABLET | Freq: Three times a day (TID) | ORAL | Status: DC | PRN

## 2019-04-16 MED ORDER — ALLOPURINOL 300 MG PO TABS
300.0000 mg | ORAL_TABLET | Freq: Every day | ORAL | Status: DC
  Administered 2019-04-17: 11:00:00 300 mg via ORAL
  Filled 2019-04-16: qty 1

## 2019-04-16 MED ORDER — ONDANSETRON HCL 4 MG/2ML IJ SOLN
INTRAMUSCULAR | Status: DC | PRN
  Administered 2019-04-16: 4 mg via INTRAVENOUS

## 2019-04-16 MED ORDER — TRAMADOL HCL 50 MG PO TABS: 50.0000 mg | ORAL_TABLET | Freq: Four times a day (QID) | ORAL | Status: DC | PRN

## 2019-04-16 MED ORDER — SENNA 8.6 MG PO TABS
1.0000 | ORAL_TABLET | Freq: Two times a day (BID) | ORAL | Status: DC
  Administered 2019-04-16 – 2019-04-17 (×2): 8.6 mg via ORAL
  Filled 2019-04-16 (×2): qty 1

## 2019-04-16 MED ORDER — CLONAZEPAM 0.5 MG PO TABS: 0.5000 mg | ORAL_TABLET | Freq: Two times a day (BID) | ORAL | Status: DC | PRN

## 2019-04-16 MED ORDER — CEFAZOLIN SODIUM-DEXTROSE 2-4 GM/100ML-% IV SOLN
2.0000 g | Freq: Once | INTRAVENOUS | Status: AC
  Administered 2019-04-16: 2 g via INTRAVENOUS

## 2019-04-16 MED ORDER — BUPIVACAINE HCL (PF) 0.25 % IJ SOLN
INTRAMUSCULAR | Status: AC
  Filled 2019-04-16: qty 20

## 2019-04-16 MED ORDER — LIDOCAINE HCL (CARDIAC) PF 100 MG/5ML IV SOSY
PREFILLED_SYRINGE | INTRAVENOUS | Status: DC | PRN
  Administered 2019-04-16: 60 mg via INTRAVENOUS

## 2019-04-16 MED ORDER — ENOXAPARIN SODIUM 40 MG/0.4ML ~~LOC~~ SOLN
40.0000 mg | SUBCUTANEOUS | Status: DC
  Administered 2019-04-17: 11:00:00 40 mg via SUBCUTANEOUS
  Filled 2019-04-16: qty 0.4

## 2019-04-16 MED ORDER — FENTANYL CITRATE (PF) 250 MCG/5ML IJ SOLN
INTRAMUSCULAR | Status: DC | PRN
  Administered 2019-04-16: 50 ug via INTRAVENOUS
  Administered 2019-04-16 (×2): 25 ug via INTRAVENOUS
  Administered 2019-04-16: 100 ug via INTRAVENOUS
  Administered 2019-04-16: 50 ug via INTRAVENOUS

## 2019-04-16 MED ORDER — OXYCODONE HCL 5 MG/5ML PO SOLN: 5.0000 mg | Freq: Once | ORAL | Status: DC | PRN

## 2019-04-16 MED ORDER — ATORVASTATIN CALCIUM 10 MG PO TABS
20.0000 mg | ORAL_TABLET | Freq: Every day | ORAL | Status: DC
  Administered 2019-04-17: 20 mg via ORAL
  Filled 2019-04-16: qty 2

## 2019-04-16 MED ORDER — LIDOCAINE HCL 1 % IJ SOLN
INTRAMUSCULAR | Status: AC
  Filled 2019-04-16: qty 20

## 2019-04-16 MED ORDER — FENTANYL CITRATE (PF) 250 MCG/5ML IJ SOLN
INTRAMUSCULAR | Status: AC
  Filled 2019-04-16: qty 5

## 2019-04-16 MED ORDER — METOPROLOL TARTRATE 5 MG/5ML IV SOLN: 5.0000 mg | Freq: Four times a day (QID) | INTRAVENOUS | Status: DC | PRN

## 2019-04-16 MED ORDER — GABAPENTIN 100 MG PO CAPS
100.0000 mg | ORAL_CAPSULE | Freq: Two times a day (BID) | ORAL | Status: DC
  Administered 2019-04-16 – 2019-04-17 (×2): 100 mg via ORAL
  Filled 2019-04-16 (×2): qty 1

## 2019-04-16 MED ORDER — METOPROLOL SUCCINATE ER 25 MG PO TB24
25.0000 mg | ORAL_TABLET | Freq: Every day | ORAL | Status: DC
  Administered 2019-04-17: 25 mg via ORAL
  Filled 2019-04-16: qty 1

## 2019-04-16 MED ORDER — PHENYLEPHRINE HCL-NACL 10-0.9 MG/250ML-% IV SOLN
INTRAVENOUS | Status: DC | PRN
  Administered 2019-04-16: 30 ug/min via INTRAVENOUS

## 2019-04-16 MED ORDER — CEPHALEXIN 500 MG PO CAPS: 500.0000 mg | ORAL_CAPSULE | Freq: Three times a day (TID) | ORAL | 0 refills | Status: AC

## 2019-04-16 MED ORDER — METHYLENE BLUE 0.5 % INJ SOLN
INTRAVENOUS | Status: AC
  Filled 2019-04-16: qty 10

## 2019-04-16 MED ORDER — BUPIVACAINE HCL 0.25 % IJ SOLN
INTRAMUSCULAR | Status: DC | PRN
  Administered 2019-04-16: 20 mL

## 2019-04-16 MED ORDER — LIDOCAINE 2% (20 MG/ML) 5 ML SYRINGE
INTRAMUSCULAR | Status: AC
  Filled 2019-04-16: qty 5

## 2019-04-16 MED ORDER — OXYCODONE HCL 5 MG PO TABS: 5.0000 mg | ORAL_TABLET | Freq: Once | ORAL | Status: DC | PRN

## 2019-04-16 MED ORDER — MORPHINE SULFATE (PF) 2 MG/ML IV SOLN: 1.0000 mg | INTRAVENOUS | Status: DC | PRN

## 2019-04-16 MED ORDER — ACETAMINOPHEN 500 MG PO TABS
500.0000 mg | ORAL_TABLET | Freq: Every day | ORAL | Status: DC
  Administered 2019-04-17: 500 mg via ORAL
  Filled 2019-04-16: qty 1

## 2019-04-16 MED ORDER — SODIUM CHLORIDE (PF) 0.9 % IJ SOLN
INTRAMUSCULAR | Status: AC
  Filled 2019-04-16: qty 10

## 2019-04-16 MED ORDER — CEPHALEXIN 500 MG PO CAPS
500.0000 mg | ORAL_CAPSULE | Freq: Three times a day (TID) | ORAL | Status: DC
  Administered 2019-04-17 (×2): 500 mg via ORAL
  Filled 2019-04-16 (×2): qty 1

## 2019-04-16 MED ORDER — DIPHENHYDRAMINE HCL 12.5 MG/5ML PO ELIX: 12.5000 mg | ORAL_SOLUTION | Freq: Four times a day (QID) | ORAL | Status: DC | PRN

## 2019-04-16 MED ORDER — HEPARIN SOD (PORK) LOCK FLUSH 100 UNIT/ML IV SOLN
INTRAVENOUS | Status: AC
  Filled 2019-04-16: qty 5

## 2019-04-16 MED ORDER — OXYCODONE HCL 5 MG PO TABS: 5.0000 mg | ORAL_TABLET | Freq: Four times a day (QID) | ORAL | 0 refills | Status: AC | PRN

## 2019-04-16 MED ORDER — ASPIRIN 81 MG PO CHEW: 81.0000 mg | CHEWABLE_TABLET | Freq: Every day | ORAL | Status: DC

## 2019-04-16 MED ORDER — SIMETHICONE 80 MG PO CHEW: 40.0000 mg | CHEWABLE_TABLET | Freq: Four times a day (QID) | ORAL | Status: DC | PRN

## 2019-04-16 MED ORDER — ACETAMINOPHEN 325 MG PO TABS: 650.0000 mg | ORAL_TABLET | Freq: Four times a day (QID) | ORAL | Status: DC | PRN

## 2019-04-16 MED ORDER — ACETAMINOPHEN 650 MG RE SUPP: 650.0000 mg | Freq: Four times a day (QID) | RECTAL | Status: DC | PRN

## 2019-04-16 MED ORDER — ONDANSETRON HCL 4 MG/2ML IJ SOLN: 4.0000 mg | Freq: Once | INTRAMUSCULAR | Status: DC | PRN

## 2019-04-16 MED ORDER — KCL IN DEXTROSE-NACL 20-5-0.45 MEQ/L-%-% IV SOLN
INTRAVENOUS | Status: DC
  Filled 2019-04-16 (×2): qty 1000

## 2019-04-16 MED ORDER — LIDOCAINE-EPINEPHRINE 1 %-1:100000 IJ SOLN
INTRAMUSCULAR | Status: AC
  Filled 2019-04-16: qty 1

## 2019-04-16 MED ORDER — ACETAMINOPHEN 10 MG/ML IV SOLN
INTRAVENOUS | Status: AC
  Filled 2019-04-16: qty 100

## 2019-04-16 MED ORDER — LACTATED RINGERS IV SOLN: INTRAVENOUS | Status: DC

## 2019-04-16 MED ORDER — ACETAMINOPHEN 10 MG/ML IV SOLN
INTRAVENOUS | Status: DC | PRN
  Administered 2019-04-16: 1000 mg via INTRAVENOUS

## 2019-04-16 MED ORDER — ONDANSETRON 4 MG PO TBDP: 4.0000 mg | ORAL_TABLET | Freq: Four times a day (QID) | ORAL | Status: DC | PRN

## 2019-04-16 SURGICAL SUPPLY — 62 items
BENZOIN TINCTURE PRP APPL 2/3 (GAUZE/BANDAGES/DRESSINGS) ×2 IMPLANT
CANISTER SUCT 3000ML PPV (MISCELLANEOUS) ×4 IMPLANT
CHLORAPREP W/TINT 26 (MISCELLANEOUS) ×4 IMPLANT
CLOSURE WOUND 1/2 X4 (GAUZE/BANDAGES/DRESSINGS) ×1
CONT SPEC 4OZ CLIKSEAL STRL BL (MISCELLANEOUS) ×2 IMPLANT
COVER SURGICAL LIGHT HANDLE (MISCELLANEOUS) ×4 IMPLANT
COVER TRANSDUCER ULTRASND GEL (DRAPE) IMPLANT
DERMABOND ADVANCED (GAUZE/BANDAGES/DRESSINGS) ×2
DERMABOND ADVANCED .7 DNX12 (GAUZE/BANDAGES/DRESSINGS) ×2 IMPLANT
DRAPE LAPAROTOMY 100X72 PEDS (DRAPES) IMPLANT
DRAPE ORTHO SPLIT 77X108 STRL (DRAPES) ×4
DRAPE SURG ORHT 6 SPLT 77X108 (DRAPES) IMPLANT
DRSG COVADERM 4X10 (GAUZE/BANDAGES/DRESSINGS) ×2 IMPLANT
DRSG COVADERM 4X8 (GAUZE/BANDAGES/DRESSINGS) ×4 IMPLANT
DRSG TEGADERM 4X4.75 (GAUZE/BANDAGES/DRESSINGS) ×2 IMPLANT
ELECT COATED BLADE 2.86 ST (ELECTRODE) ×4 IMPLANT
ELECT REM PT RETURN 9FT ADLT (ELECTROSURGICAL) ×4
ELECTRODE REM PT RTRN 9FT ADLT (ELECTROSURGICAL) ×2 IMPLANT
GAUZE 4X4 16PLY RFD (DISPOSABLE) ×4 IMPLANT
GAUZE SPONGE 4X4 12PLY STRL (GAUZE/BANDAGES/DRESSINGS) IMPLANT
GLOVE BIO SURGEON STRL SZ 6 (GLOVE) ×4 IMPLANT
GLOVE INDICATOR 6.5 STRL GRN (GLOVE) ×4 IMPLANT
GOWN STRL REUS W/ TWL LRG LVL3 (GOWN DISPOSABLE) ×2 IMPLANT
GOWN STRL REUS W/TWL 2XL LVL3 (GOWN DISPOSABLE) ×4 IMPLANT
GOWN STRL REUS W/TWL LRG LVL3 (GOWN DISPOSABLE) ×2
KIT BASIN OR (CUSTOM PROCEDURE TRAY) ×4 IMPLANT
KIT TURNOVER KIT B (KITS) ×4 IMPLANT
MARKER SKIN DUAL TIP RULER LAB (MISCELLANEOUS) ×4 IMPLANT
NDL HYPO 25GX1X1/2 BEV (NEEDLE) ×4 IMPLANT
NEEDLE 22X1 1/2 (OR ONLY) (NEEDLE) ×4 IMPLANT
NEEDLE HYPO 25GX1X1/2 BEV (NEEDLE) ×8 IMPLANT
NS IRRIG 1000ML POUR BTL (IV SOLUTION) ×4 IMPLANT
PACK GENERAL/GYN (CUSTOM PROCEDURE TRAY) ×4 IMPLANT
PAD ARMBOARD 7.5X6 YLW CONV (MISCELLANEOUS) ×8 IMPLANT
PENCIL SMOKE EVACUATOR (MISCELLANEOUS) ×4 IMPLANT
POSITIONER HEAD DONUT 9IN (MISCELLANEOUS) ×4 IMPLANT
SPECIMEN JAR SMALL (MISCELLANEOUS) ×4 IMPLANT
SPONGE LAP 18X18 RF (DISPOSABLE) ×4 IMPLANT
STRIP CLOSURE SKIN 1/2X4 (GAUZE/BANDAGES/DRESSINGS) ×1 IMPLANT
SUT ETHILON 2 0 FS 18 (SUTURE) ×4 IMPLANT
SUT MNCRL AB 4-0 PS2 18 (SUTURE) ×6 IMPLANT
SUT MON AB 4-0 PC3 18 (SUTURE) ×4 IMPLANT
SUT PROLENE 2 0 SH DA (SUTURE) ×8 IMPLANT
SUT PROLENE 4 0 RB 1 (SUTURE) ×2
SUT PROLENE 4-0 RB1 18X2 ARM (SUTURE) IMPLANT
SUT SILK 2 0 PERMA HAND 18 BK (SUTURE) IMPLANT
SUT VIC AB 2-0 SH 27 (SUTURE) ×4
SUT VIC AB 2-0 SH 27XBRD (SUTURE) ×2 IMPLANT
SUT VIC AB 3-0 SH 18 (SUTURE) IMPLANT
SUT VIC AB 3-0 SH 27 (SUTURE) ×2
SUT VIC AB 3-0 SH 27X BRD (SUTURE) ×2 IMPLANT
SUT VIC AB 3-0 SH 8-18 (SUTURE) ×8 IMPLANT
SUT VICRYL AB 3 0 TIES (SUTURE) ×2 IMPLANT
SYR 5ML LUER SLIP (SYRINGE) ×2 IMPLANT
SYR BULB IRRIGATION 50ML (SYRINGE) ×4 IMPLANT
SYR CONTROL 10ML LL (SYRINGE) ×6 IMPLANT
TOWEL GREEN STERILE (TOWEL DISPOSABLE) ×4 IMPLANT
TOWEL GREEN STERILE FF (TOWEL DISPOSABLE) ×4 IMPLANT
TRAY LAPAROSCOPIC MC (CUSTOM PROCEDURE TRAY) ×4 IMPLANT
TUBE CONNECTING 12'X1/4 (SUCTIONS)
TUBE CONNECTING 12X1/4 (SUCTIONS) IMPLANT
YANKAUER SUCT BULB TIP NO VENT (SUCTIONS) IMPLANT

## 2019-04-16 NOTE — Anesthesia Postprocedure Evaluation (Signed)
Anesthesia Post Note  Patient: Edward Pitts  Procedure(s) Performed: MELANOMA EXCISION LEFT GROIN MASS (Left Groin) Left inguinal Lymph Node Dissection (Left Groin)     Patient location during evaluation: PACU Anesthesia Type: General Level of consciousness: awake and alert Pain management: pain level controlled Vital Signs Assessment: post-procedure vital signs reviewed and stable Respiratory status: spontaneous breathing, nonlabored ventilation, respiratory function stable and patient connected to nasal cannula oxygen Cardiovascular status: blood pressure returned to baseline and stable Postop Assessment: no apparent nausea or vomiting Anesthetic complications: no    Last Vitals:  Vitals:   04/16/19 1830 04/16/19 1847  BP: (!) 114/48 (!) 133/53  Pulse: 63 63  Resp: 17 17  Temp:  36.9 C  SpO2: 99% 98%    Last Pain:  Vitals:   04/16/19 1847  TempSrc: Oral  PainSc:                  [name]

## 2019-04-16 NOTE — Discharge Instructions (Signed)
Surgical Drain Home Care Surgical drains are used to remove extra fluid that normally builds up in a surgical wound after surgery. A surgical drain helps to heal a surgical wound. Different kinds of surgical drains include:  Active drains. These drains use suction to pull drainage away from the surgical wound. Drainage flows through a tube to a container outside of the body. With these drains, you need to keep the bulb or the drainage container flat (compressed) at all times, except while you empty it. Flattening the bulb or container creates suction.  Passive drains. These drains allow fluid to drain naturally, by gravity. Drainage flows through a tube to a bandage (dressing) or a container outside of the body. Passive drains do not need to be emptied. A drain is placed during surgery. Right after surgery, drainage is usually bright red and a little thicker than water. The drainage may gradually turn yellow or pink and become thinner. It is likely that your health care provider will remove the drain when the drainage stops or when the amount decreases to 1-2 Tbsp (15-30 mL) during a 24-hour period. Supplies needed:  Tape.  Germ-free cleaning solution (sterile saline).  Cotton swabs.  Split gauze drain sponge: 4 x 4 inches (10 x 10 cm).  Gauze square: 4 x 4 inches (10 x 10 cm). How to care for your surgical drain Care for your drain as told by your health care provider. This is important to help prevent infection. If your drain is placed at your back, or any other hard-to-reach area, ask another person to assist you in performing the following tasks: General care  Keep the skin around the drain dry and covered with a dressing at all times.  Check your drain area every day for signs of infection. Check for: ? Redness, swelling, or pain. ? Pus or a bad smell. ? Cloudy drainage. ? Tenderness or pressure at the drain exit site. Changing the dressing Follow instructions from your health care  provider about how to change your dressing. Change your dressing at least once a day. Change it more often if needed to keep the dressing dry. Make sure you: 1. Gather your supplies. 2. Wash your hands with soap and water before you change your dressing. If soap and water are not available, use hand sanitizer. 3. Remove the old dressing. Avoid using scissors to do that. 4. Wash your hands with soap and water again after removing the old dressing. 5. Use sterile saline to clean your skin around the drain. You may need to use a cotton swab to clean the skin. 6. Place the tube through the slit in a drain sponge. Place the drain sponge so that it covers your wound. 7. Place the gauze square or another drain sponge on top of the drain sponge that is on the wound. Make sure the tube is between those layers. 8. Tape the dressing to your skin. 9. Tape the drainage tube to your skin 1-2 inches (2.5-5 cm) below the place where the tube enters your body. Taping keeps the tube from pulling on any stitches (sutures) that you have. 10. Wash your hands with soap and water. 11. Write down the color of your drainage and how often you change your dressing. How to empty your active drain  1. Make sure that you have a measuring cup that you can empty your drainage into. 2. Wash your hands with soap and water. If soap and water are not available, use hand sanitizer. 3.   Loosen any pins or clips that hold the tube in place. 4. If your health care provider tells you to strip the tube to prevent clots and tube blockages: ? Hold the tube at the skin with one hand. Use your other hand to pinch the tubing with your thumb and first finger. ? Gently move your fingers down the tube while squeezing very lightly. This clears any drainage, clots, or tissue from the tube. ? You may need to do this several times each day to keep the tube clear. Do not pull on the tube. 5. Open the bulb cap or the drain plug. Do not touch the  inside of the cap or the bottom of the plug. 6. Turn the device upside down and gently squeeze. 7. Empty all of the drainage into the measuring cup. 8. Compress the bulb or the container and replace the cap or the plug. To compress the bulb or the container, squeeze it firmly in the middle while you close the cap or plug the container. 9. Write down the amount of drainage that you have in each 24-hour period. If you have less than 2 Tbsp (30 mL) of drainage during 24 hours, contact your health care provider. 10. Flush the drainage down the toilet. 11. Wash your hands with soap and water. Contact a health care provider if:  You have redness, swelling, or pain around your drain area.  You have pus or a bad smell coming from your drain area.  You have a fever or chills.  The skin around your drain is warm to the touch.  The amount of drainage that you have is increasing instead of decreasing.  You have drainage that is cloudy.  There is a sudden stop or a sudden decrease in the amount of drainage that you have.  Your drain tube falls out.  Your active drain does not stay compressed after you empty it. Summary  Surgical drains are used to remove extra fluid that normally builds up in a surgical wound after surgery.  Different kinds of surgical drains include active drains and passive drains. Active drains use suction to pull drainage away from the surgical wound, and passive drains allow fluid to drain naturally.  It is important to care for your drain to prevent infection. If your drain is placed at your back, or any other hard-to-reach area, ask another person to assist you.  Contact your health care provider if you have redness, swelling, or pain around your drain area. This information is not intended to replace advice given to you by your health care provider. Make sure you discuss any questions you have with your health care provider. Document Revised: 04/17/2018 Document  Reviewed: 04/17/2018 Elsevier Patient Education  2020 Elsevier Inc.  

## 2019-04-16 NOTE — Transfer of Care (Signed)
Immediate Anesthesia Transfer of Care Note  Patient: Edward Pitts  Procedure(s) Performed: MELANOMA EXCISION LEFT GROIN MASS (Left Groin) Left inguinal Lymph Node Dissection (Left Groin)  Patient Location: PACU  Anesthesia Type:General  Level of Consciousness: awake, alert  and oriented  Airway & Oxygen Therapy: Patient Spontanous Breathing and Patient connected to nasal cannula oxygen  Post-op Assessment: Report given to RN and Post -op Vital signs reviewed and stable  Post vital signs: Reviewed and stable  Last Vitals:  Vitals Value Taken Time  BP 131/47 04/16/19 1715  Temp 37.7 C 04/16/19 1715  Pulse 66 04/16/19 1725  Resp 27 04/16/19 1725  SpO2 95 % 04/16/19 1725  Vitals shown include unvalidated device data.  Last Pain:  Vitals:   04/16/19 1715  PainSc: 0-No pain         Complications: No apparent anesthesia complications

## 2019-04-16 NOTE — Progress Notes (Signed)
Dr. Renold Don, MD (Anesthesia) updated re: patient's [employer] pacemaker, which he follows with at [facility].  Dr. Fransisco Beau is aware that our local device clinic does not care for this pacer and he expresses no concern for this procedure.

## 2019-04-16 NOTE — Anesthesia Procedure Notes (Signed)
Procedure Name: LMA Insertion Date/Time: 04/16/2019 2:12 PM Performed by: Larene Beach, CRNA Pre-anesthesia Checklist: Patient identified, Emergency Drugs available, Suction available and Patient being monitored Patient Re-evaluated:Patient Re-evaluated prior to induction Oxygen Delivery Method: Circle system utilized Preoxygenation: Pre-oxygenation with 100% oxygen Induction Type: IV induction Ventilation: Mask ventilation without difficulty LMA: LMA inserted LMA Size: 5.0 Number of attempts: 1 Placement Confirmation: positive ETCO2,  breath sounds checked- equal and bilateral and CO2 detector Tube secured with: Tape Dental Injury: Teeth and Oropharynx as per pre-operative assessment

## 2019-04-16 NOTE — Interval H&P Note (Signed)
History and Physical Interval Note:  04/16/2019 1:39 PM  Edward Pitts  has presented today for surgery, with the diagnosis of MELANOMA.  The various methods of treatment have been discussed with the patient and family. After consideration of risks, benefits and other options for treatment, the patient has consented to  Procedure(s): MELANOMA EXCISION LEFT GROIN MASS, LEFT INGUINAL LYMPH NODE DISSECTION (Left) INSERTION PORT-A-CATH (N/A) as a surgical intervention.  The patient's history has been reviewed, patient examined, no change in status, stable for surgery.  I have reviewed the patient's chart and labs.  Questions were answered to the patient's satisfaction.     [name]

## 2019-04-16 NOTE — Op Note (Signed)
PRE-OPERATIVE DIAGNOSIS: large left groin mass biopsy proven melanoma  POST-OPERATIVE DIAGNOSIS:  Same  PROCEDURE:  Procedure(s): Radical excision of malignant left groin mass- 15x13x12 cm, superficial left inguinal lymph node dissection  SURGEON:  Surgeon(s): [name], MD  ASSISTANT: Carlena Hurl, PA-C   ANESTHESIA:   local and general  DRAINS: (68 Fr) Blake drain(s) in the Left lateral thigh   LOCAL MEDICATIONS USED:  OTHER exparel and marcaine  SPECIMEN:  Source of Specimen:  left groin mass and additional left inguinal contents.    DISPOSITION OF SPECIMEN:  PATHOLOGY  COUNTS:  YES  DICTATION: .Dragon Dictation  PLAN OF CARE: Admit for overnight observation  PATIENT DISPOSITION:  PACU - hemodynamically stable.  FINDINGS:  Large left groin mass adherent to skin and sartorius muscle  EBL: 100 mL  PROCEDURE:  Patient was identified in the holding area and taken to the operating room where he was placed supine on the operating table.  General anesthesia was induced.  The patient's left groin, thigh, and lower abdomen were prepped and draped in sterile fashion.  Timeout was performed according to the surgical safety checklist.  When all was correct, we continued.  An oblique curvilinear incision was made from below the anterior superior iliac spine towards the medial thigh overlying the mass. A portion of skin was left intact over the mass.  The skin was thinned out with the mass and had significant vasculature.  This bleeding was controlled with the cautery.  Skin flaps were created by dissecting away from the mass with the cautery and the harmonic scalpel.  The superior portion of the mass was well-circumscribed.  Laterally, the mass was adherent to the sartorius.  A portion of the sartorius was taken en bloc with the mass.    Medially, the saphenous vein required division because of its intimate proximity to the mass.  Several of the larger bridging veins into the mass were  clamped, ligated, and tied.  The mass was very adherent posteriorly near the takeoff of the saphenous vein.  This was clamped and suture ligated.  It was also marked with clips.  The skin was irrigated with antibiotic irrigation.  A 19 [place] Blake drain was placed laterally.  This was secured with 2-0 nylon.  The skin was then closed using interrupted 3-0 Vicryl pops and running 4-0 Monocryl.  The skin was then cleaned, dried, and dressed with soft sterile dressings.    Patient was allowed to emerge from anesthesia and taken to the PACU in stable condition.  Needle, sponge, and instrument counts are correct x2.

## 2019-04-17 DIAGNOSIS — C774 Secondary and unspecified malignant neoplasm of inguinal and lower limb lymph nodes: Secondary | ICD-10-CM | POA: Diagnosis not present

## 2019-04-17 LAB — CBC
HCT: 30.1 % — ABNORMAL LOW (ref 39.0–52.0)
Hemoglobin: 9.7 g/dL — ABNORMAL LOW (ref 13.0–17.0)
MCH: 28.8 pg (ref 26.0–34.0)
MCHC: 32.2 g/dL (ref 30.0–36.0)
MCV: 89.3 fL (ref 80.0–100.0)
Platelets: 311 10*3/uL (ref 150–400)
RBC: 3.37 MIL/uL — ABNORMAL LOW (ref 4.22–5.81)
RDW: 13 % (ref 11.5–15.5)
WBC: 11.3 10*3/uL — ABNORMAL HIGH (ref 4.0–10.5)
nRBC: 0 % (ref 0.0–0.2)

## 2019-04-17 LAB — COMPREHENSIVE METABOLIC PANEL
ALT: 24 U/L (ref 0–44)
AST: 27 U/L (ref 15–41)
Albumin: 3.3 g/dL — ABNORMAL LOW (ref 3.5–5.0)
Alkaline Phosphatase: 79 U/L (ref 38–126)
Anion gap: 12 (ref 5–15)
BUN: 25 mg/dL — ABNORMAL HIGH (ref 6–20)
CO2: 23 mmol/L (ref 22–32)
Calcium: 8.8 mg/dL — ABNORMAL LOW (ref 8.9–10.3)
Chloride: 102 mmol/L (ref 98–111)
Creatinine, Ser: 1.39 mg/dL — ABNORMAL HIGH (ref 0.61–1.24)
GFR calc Af Amer: >60 mL/min (ref >60)
GFR calc non Af Amer: 55 mL/min — ABNORMAL LOW (ref >60)
Glucose, Bld: 186 mg/dL — ABNORMAL HIGH (ref 70–99)
Potassium: 4.6 mmol/L (ref 3.5–5.1)
Sodium: 137 mmol/L (ref 135–145)
Total Bilirubin: 0.2 mg/dL — ABNORMAL LOW (ref 0.3–1.2)
Total Protein: 6.6 g/dL (ref 6.5–8.1)

## 2019-04-17 NOTE — TOC Initial Note (Addendum)
Transition of Care [facility]) - Initial/Assessment Note    Patient Details  Name: Edward Pitts MRN: [id number] Date of Birth: [birth year]  Transition of Care [facility]) CM/SW Contact:    Marilu Favre, RN Phone Number: 04/17/2019, 10:51 AM  Clinical Narrative:                 Confirmed face sheet information with patient. Patient from home with wife.   Ordered walker from [place] with [facility] to be delivered to room today.   Called Tiffany with [facility] , she is checking to see if she can accept referral for HHPT.   Have [facility],and  [facility], declined they are not in network.  Messaged [facility] , [facility] ,and  [facility] awaiting call backs.  Alvis Lemmings accepted referral Expected Discharge Plan: [place] Barriers to Discharge: Continued Medical Work up   Patient Goals and CMS Choice Patient states their goals for this hospitalization and ongoing recovery are:: to return to home CMS Medicare.gov Compare Post Acute Care list provided to:: Patient Choice offered to / list presented to : Patient  Expected Discharge Plan and Services Expected Discharge Plan: [place] In-house Referral: NA Discharge Planning Services: CM Consult Post Acute Care Choice: Durable Medical Equipment, Home Health Living arrangements for the past 2 months: Single Family Home                 DME Arranged: Walker rolling DME Agency: [employer] Date DME Agency Contacted: 04/17/19 Time DME Agency Contacted: 31 Representative spoke with at DME Agency: Zack            Prior Living Arrangements/Services Living arrangements for the past 2 months: [place] with:: Spouse Patient language and need for interpreter reviewed:: Yes Do you feel safe going back to the place where you live?: Yes      Need for Family Participation in Patient Care: Yes (Comment) Care giver support system in place?: Yes (comment)   Criminal Activity/Legal  Involvement Pertinent to Current Situation/Hospitalization: No - Comment as needed  Activities of Daily Living Home Assistive Devices/Equipment: Walker (specify type), Eyeglasses ADL Screening (condition at time of admission) Patient's cognitive ability adequate to safely complete daily activities?: Yes Is the patient deaf or have difficulty hearing?: No Does the patient have difficulty seeing, even when wearing glasses/contacts?: No Does the patient have difficulty concentrating, remembering, or making decisions?: No Patient able to express need for assistance with ADLs?: Yes Does the patient have difficulty dressing or bathing?: No Independently performs ADLs?: Yes (appropriate for developmental age) Does the patient have difficulty walking or climbing stairs?: No Weakness of Legs: Both Weakness of Arms/Hands: None  Permission Sought/Granted   Permission granted to share information with : No              Emotional Assessment Appearance:: Appears stated age Attitude/Demeanor/Rapport: Engaged Affect (typically observed): Accepting Orientation: : Oriented to Self, Oriented to Place, Oriented to  Time, Oriented to Situation Alcohol / Substance Use: Not Applicable Psych Involvement: No (comment)  Admission diagnosis:  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 Near syncope 12/17/2018  . AKI (acute kidney injury) ([place]) 08/24/2018  . Acute hyperkalemia 08/24/2018  . HTN (hypertension) 08/24/2018  . HLD (hyperlipidemia) 08/24/2018  . Seizures ([place]) 08/24/2018  . Complete heart block ([place]) 08/24/2018  .  Stable angina ([place]) 06/27/2017   PCP:  Cherylann Parr, MD Pharmacy:   [id number] [place], [address] [place] [address] Phone: [phone] Fax: [phone]  [facility] [place] [place] [facility] [address] [place] [address] Phone: [phone] Fax: [phone]     Social Determinants of Health (SDOH) Interventions    Readmission Risk Interventions No flowsheet data found.

## 2019-04-17 NOTE — Evaluation (Signed)
Physical Therapy Evaluation Patient Details Name: Edward Pitts MRN: [id number] DOB: March 19, 1961 Today's Date: 04/17/2019   History of Present Illness  Pt is a 59 y/o male s/p radical excision of malignant left groin mass with drain placement. PMH including but not limited to complete heart block s/p pacemaker placement May 2020.    Clinical Impression  Pt presented supine in bed with HOB elevated, awake and willing to participate in therapy session. Prior to admission, pt reported that he was independent with all functional mobility and ADLs. Pt lives with his wife in a single level home with one step to enter. At the time of evaluation, pt overall moving very well with min guard for safety to ambulate with use of RW. Per pt, plan is to d/c home today and begin chemotherapy treatment as well. Recommendations for f/u HHPT for ongoing strengthening and to address higher level balance. Pt would continue to benefit from skilled physical therapy services at this time while admitted and after d/c to address the below listed limitations in order to improve overall safety and independence with functional mobility.     Follow Up Recommendations Home health PT    Equipment Recommendations  Rolling walker with 5" wheels    Recommendations for Other Services       Precautions / Restrictions Precautions Precautions: Fall Precaution Comments: JP drain L groin Restrictions Weight Bearing Restrictions: No      Mobility  Bed Mobility Overal bed mobility: Modified Independent                Transfers Overall transfer level: Needs assistance Equipment used: Rolling walker (2 wheeled) Transfers: Sit to/from Stand Sit to Stand: Supervision         General transfer comment: for safety  Ambulation/Gait Ambulation/Gait assistance: Min guard Gait Distance (Feet): 50 Feet Assistive device: Rolling walker (2 wheeled) Gait Pattern/deviations: Step-through pattern;Decreased stride  length;Drifts right/left Gait velocity: decreased   General Gait Details: pt with mild instability but no overt LOB or need for physical assistance, min guard for safety; pt tremulous throughout  Stairs            Wheelchair Mobility    Modified Rankin (Stroke Patients Only)       Balance Overall balance assessment: Needs assistance Sitting-balance support: Feet supported Sitting balance-Leahy Scale: Good     Standing balance support: Single extremity supported;Bilateral upper extremity supported Standing balance-Leahy Scale: Poor                               Pertinent Vitals/Pain Pain Assessment: No/denies pain    Home Living Family/patient expects to be discharged to:: Private residence Living Arrangements: Spouse/significant other Available Help at Discharge: Family;Available PRN/intermittently Type of Home: House Home Access: Stairs to enter Entrance Stairs-Rails: Right Entrance Stairs-Number of Steps: 1 Home Layout: One level Home Equipment: Wheelchair - manual;Crutches      Prior Function Level of Independence: Independent         Comments: works     Journalist, newspaper        Extremity/Trunk Assessment   Upper Extremity Assessment Upper Extremity Assessment: Overall WFL for tasks assessed    Lower Extremity Assessment Lower Extremity Assessment: Generalized weakness    Cervical / Trunk Assessment Cervical / Trunk Assessment: Normal  Communication   Communication: No difficulties  Cognition Arousal/Alertness: Awake/alert Behavior During Therapy: WFL for tasks assessed/performed Overall Cognitive Status: Within Functional Limits for tasks assessed  General Comments      Exercises     Assessment/Plan    PT Assessment Patient needs continued PT services  PT Problem List Decreased strength;Decreased balance;Decreased mobility;Decreased coordination;Decreased knowledge  of use of DME;Decreased safety awareness;Decreased knowledge of precautions       PT Treatment Interventions DME instruction;Gait training;Stair training;Functional mobility training;Therapeutic activities;Therapeutic exercise;Balance training;Neuromuscular re-education;Patient/family education    PT Goals (Current goals can be found in the Care Plan section)  Acute Rehab PT Goals Patient Stated Goal: "home today" PT Goal Formulation: With patient Time For Goal Achievement: 05/01/19 Potential to Achieve Goals: Good    Frequency Min 3X/week   Barriers to discharge        Co-evaluation               AM-PAC PT "6 Clicks" Mobility  Outcome Measure Help needed turning from your back to your side while in a flat bed without using bedrails?: None Help needed moving from lying on your back to sitting on the side of a flat bed without using bedrails?: None Help needed moving to and from a bed to a chair (including a wheelchair)?: None Help needed standing up from a chair using your arms (e.g., wheelchair or bedside chair)?: None Help needed to walk in hospital room?: None Help needed climbing 3-5 steps with a railing? : A Little 6 Click Score: 23    End of Session   Activity Tolerance: Patient tolerated treatment well Patient left: in bed;with call bell/phone within reach Nurse Communication: Mobility status PT Visit Diagnosis: Other abnormalities of gait and mobility (R26.89)    Time: [id number] PT Time Calculation (min) (ACUTE ONLY): 15 min   Charges:   PT Evaluation $PT Eval Low Complexity: 1 Low          Eduard Clos, PT, DPT  Acute Rehabilitation Services Pager [phone] Office [place] 04/17/2019, 9:07 AM

## 2019-04-17 NOTE — Progress Notes (Signed)
Patient discharged to home. Verbalizes understanding of all discharge instructions including incision care, discharge medications, and follow up MD visits. Patient accompanied by brother. Walker delivered to the patient's room prior to discharger. Transported via wheelchair to car.

## 2019-04-17 NOTE — Discharge Summary (Signed)
Physician Discharge Summary  Patient ID: Edward Pitts MRN: [id number] DOB/AGE: 06-24-1960 59 y.o.  Admit date: 04/16/2019 Discharge date: 04/17/2019  Admission Diagnoses: Large left groin mass biopsy proven melanoma Patient Active Problem List   Diagnosis Date Noted  . 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 Near syncope 12/17/2018  . AKI (acute kidney injury) ([place]) 08/24/2018  . Acute hyperkalemia 08/24/2018  . HTN (hypertension) 08/24/2018  . HLD (hyperlipidemia) 08/24/2018  . Seizures ([place]) 08/24/2018  . Complete heart block ([place]) 08/24/2018  . 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  He had minimal pain and thought the discomfort was significantly improved post op compared to pre op.  He ambulated with PT and HHPT as well as walker was recommended.  He underwent drain teaching.  His hemodynamics were stable.  Drain was serosanguinous and no hematoma was present.  He was discharged in stable condition.    Consults: PT  Significant Diagnostic Studies: labs: HCT prior to d/c 30.1  Treatments: surgery: see above  Discharge Exam: Blood pressure (!) 139/53, pulse 70, temperature (!) 97.4 F (36.3 C), temperature source Oral, resp. rate 20, height 5\' 7"  (1.702 m), weight 99.8 kg, SpO2 98 %. General appearance: alert, cooperative and no distress Resp: breathing comfortably GI: soft, non tender, non distended Extremities: swelling in LLE.  Drain serosang.  no hematoma  Disposition: Discharge disposition: 01-Home or Self Care       Discharge Instructions    Ambulatory referral to Home Health   Complete by: As directed    Please evaluate Edward Pitts for admission to [facility].  Disciplines requested: Physical Therapy  Services to provide: Strengthening Exercises  Physician to follow patient's care (the person listed here will be responsible for signing ongoing orders): Referring Provider  Requested Start of Care Date: Tomorrow  I certify that this patient is under my care and that I, or a Nurse Practitioner or Physician's Assistant working with me, had a face-to-face encounter that meets the physician face-to-face requirements with patient on 04/17/19. The encounter with the patient was in whole, or in part for the following medical condition(s) which is the primary reason for home health care (List medical condition). 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  Special Instructions:   Does the patient have Medicare or Medicaid?: No   The encounter with the patient was in whole, or in part, for the following medical condition, which is the primary reason for home health care: malignant melanoma to inguinal lymph nodes   Reason for Medically Necessary Home Health Services: Therapy- Personnel officer, Public librarian   My clinical findings support the need for the above services: Unable to leave home safely without assistance and/or assistive device   I certify that, based on my findings, the following services are medically necessary home health services: Physical therapy   Further, I certify that my clinical findings support that this patient is homebound due to: Unable to leave home safely without assistance   Call MD for:  difficulty breathing, headache or visual disturbances   Complete by: As directed    Call MD for:  hives   Complete by: As directed    Call MD for:  persistant dizziness  or light-headedness   Complete by: As directed    Call MD for:  persistant nausea and vomiting   Complete by: As directed    Call MD for:  redness, tenderness, or signs of infection (pain, swelling, redness, odor or green/yellow discharge  around incision site)   Complete by: As directed    Call MD for:  severe uncontrolled pain   Complete by: As directed    Call MD for:  temperature >100.4   Complete by: As directed    Change dressing (specify)   Complete by: As directed    Measure and record drain output BID.  Bring record to clinic.   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 Take 1 tablet (650 mg) by mouth scheduled for arthritis pain, may take an additional dose if needed for pain.   allopurinol 300 MG tablet Commonly known as: ZYLOPRIM Take 300 mg by mouth daily.   aspirin 81 MG chewable tablet Chew 81 mg by mouth daily.   atorvastatin 20 MG tablet Commonly known as: LIPITOR Take 20 mg by mouth daily.   cephALEXin 500 MG capsule Commonly known as: KEFLEX Take 1 capsule (500 mg total) by mouth 3 (three) times daily for 10 days.   clonazePAM 0.5 MG tablet Commonly known as: KLONOPIN 1 tablet in the morning, 2 in the evening What changed:   how much to take  how to take this  when to take this  reasons to take this  additional instructions   hydrocortisone 2.5 % cream Apply 1 application topically daily as needed (for itching).   ketoconazole 2 % cream Commonly known as: NIZORAL Apply 1 application topically daily as needed for irritation.   lidocaine 5 % Commonly known as: Lidoderm Place 1 patch onto the skin every 12 (twelve) hours. Remove & Discard patch within 12  hours or as directed by MD   lisinopril 20 MG tablet Commonly known as: ZESTRIL Take 20 mg by mouth daily.   metoprolol succinate 25 MG 24 hr tablet Commonly known as: TOPROL-XL Take 25 mg by mouth daily.   multivitamin with minerals Tabs tablet Take 1 tablet by mouth daily. Centrum Silver   ondansetron 8 MG tablet Commonly known as: ZOFRAN Take 1 tablet (8 mg total) by mouth every 8 (eight) hours as needed for nausea, vomiting or refractory nausea / vomiting.   oxyCODONE 5 MG immediate release tablet Commonly known as: Oxy IR/ROXICODONE Take 1-2 tablets (5-10 mg total) by mouth every 6 (six) hours as needed for up to 7 days for moderate pain or severe pain.   traMADol 50 MG tablet Commonly known as: ULTRAM Take 1 tablet (50 mg total) by mouth every 6 (six) hours as needed.            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  Bring record to clinic.   04/17/19 1056         Follow-up Information    [name], MD Follow up on 05/01/2019.   Specialty: General Surgery Contact information: [place] [id number] [phone]        [facility], [facility] Follow up.   Specialty: Home Health Services Contact information: [place]  [address] [phone]           Signed: [name] 04/17/2019, 12:12 PM

## 2019-04-23 ENCOUNTER — Inpatient Hospital Stay: Payer: BC Managed Care – PPO | Attending: Oncology | Admitting: Oncology

## 2019-04-23 ENCOUNTER — Other Ambulatory Visit: Payer: Self-pay

## 2019-04-23 ENCOUNTER — Encounter: Payer: Self-pay | Admitting: Oncology

## 2019-04-23 VITALS — BP 147/69 | HR 90 | Resp 18 | Wt 218.6 lb

## 2019-04-23 DIAGNOSIS — C438 Malignant melanoma of overlapping sites of skin: Secondary | ICD-10-CM

## 2019-04-23 DIAGNOSIS — C4359 Malignant melanoma of other part of trunk: Secondary | ICD-10-CM | POA: Diagnosis not present

## 2019-04-23 DIAGNOSIS — Z7189 Other specified counseling: Secondary | ICD-10-CM

## 2019-04-23 DIAGNOSIS — D649 Anemia, unspecified: Secondary | ICD-10-CM | POA: Diagnosis not present

## 2019-04-23 LAB — SURGICAL PATHOLOGY

## 2019-04-23 NOTE — Progress Notes (Signed)
START ON PATHWAY REGIMEN - Melanoma and Other Skin Cancers     A cycle is every 14 days:     Nivolumab   **Always confirm dose/schedule in your pharmacy ordering system**  Patient Characteristics: Melanoma, Local Recurrence - Resected, BRAF V600  Wild Type / BRAF V600 Results Pending or Unknown Disease Classification: Melanoma Disease Subtype: Cutaneous Therapeutic Status: Local Recurrence - Resected BRAF V600 Mutation Status: BRAF V600 Wild Type (No Mutation) Intent of Therapy: Curative Intent, Discussed with Patient

## 2019-04-23 NOTE — Progress Notes (Signed)
Patient here for follow up. He had surgery on 1/20 and reports that left thigh is numb.

## 2019-04-23 NOTE — Progress Notes (Signed)
Hematology/Oncology follow up  note [facility] [phone] [phone]   Patient Care Team: [name], [name], MD as PCP - General (Student)  REFERRING PROVIDER: Cherylann Parr, MD  CHIEF COMPLAINTS/REASON FOR VISIT:  Follow up for melanoma HISTORY OF PRESENTING ILLNESS:   Edward Pitts is a  59 y.o.  male with PMH listed below was seen in consultation at the request of  [name], [name], MD  for evaluation of inguinal mass Patient presented to emergency room 3 days ago complaining about left inguinal mass discomfort. Reports that he has really noticed the mass growing for the past 1 months. He has a history of left lower extremity melanoma in 2011, status post local excision.  Pain was increased with squatting of laxation. He was advised to take Tylenol for pain. Denies any fever, chills, night sweating.  He does feel mild nauseated. Appetite is fair.  He has lost about 10 pounds since earlier this year. 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  Left inguinal and left iliac nodes which are suspicious for involvement.  There are also 2 small nonspecific hypodense lesions within the right liver, nonspecific. He was accompanied by wife today.   INTERVAL HISTORY Edward Pitts is a 59 y.o. male who has above history reviewed by me today presents for follow up visit for management of newly diagnosed inguinal nodal recurrence of melanoma Problems and complaints are listed below: During the interval, patient underwent left groin mass resection On 04/16/2019. Patient is accompanied by his wife to go over pathology, discussion of management plan. Patient's wife is Spanish-speaking, online Spanish interpreter service was used for the entire encounter. He reports some soreness of the surgery site.  Otherwise no new complaints.  Review of Systems  Constitutional: Positive for fatigue. Negative for  appetite change, chills, fever and unexpected weight change.  HENT:   Negative for hearing loss and voice change.   Eyes: Negative for eye problems and icterus.  Respiratory: Negative for chest tightness, cough and shortness of breath.   Cardiovascular: Negative for chest pain and leg swelling.  Gastrointestinal: Negative for abdominal distention and abdominal pain.  Endocrine: Negative for hot flashes.  Genitourinary: Negative for difficulty urinating, dysuria and frequency.   Musculoskeletal: Negative for arthralgias.  Skin: Negative for itching and rash.       Status post left groin mass resection.  Neurological: Negative for light-headedness and numbness.  Hematological: Negative for adenopathy. Does not bruise/bleed easily.  Psychiatric/Behavioral: Negative for confusion.    MEDICAL HISTORY:  Past Medical History:  Diagnosis Date  . Anxiety   . Arthritis   . Complication of anesthesia   . Family history of adverse reaction to anesthesia    PONV mother  . Hyperlipidemia   . Hypertension   . Melanoma ([place]) 2012  . PONV (postoperative nausea and vomiting) 04/16/2019  . Presence of permanent cardiac pacemaker    [employer]  . Seizures ([place])     SURGICAL HISTORY: Past Surgical History:  Procedure Laterality Date  . KNEE SURGERY Left   . LEFT HEART CATH AND CORONARY ANGIOGRAPHY Left 06/29/2017   Procedure: LEFT HEART CATH AND CORONARY ANGIOGRAPHY;  Surgeon: [name], MD;  Location: [place] [facility];  Service: Cardiovascular;  Laterality: Left;  . LYMPH NODE DISSECTION Left 04/16/2019   Procedure: Left inguinal Lymph Node Dissection;  Surgeon: [name], MD;  Location: [place];  Service:  General;  Laterality: Left;  Marland Kitchen MELANOMA EXCISION Left 04/16/2019   Procedure: MELANOMA EXCISION LEFT GROIN MASS;  Surgeon: [name], MD;  Location: [place];  Service: General;  Laterality: Left;  Marland Kitchen MELANOMA EXCISION WITH SENTINEL LYMPH NODE BIOPSY Left 2012   Left calf   .  PACEMAKER INSERTION N/A 08/26/2018   Procedure: INSERTION PACEMAKER;  Surgeon: [name], MD;  Location: [facility];  Service: Cardiovascular;  Laterality: N/A;  . TEMPORARY PACEMAKER N/A 08/25/2018   Procedure: TEMPORARY PACEMAKER;  Surgeon: [name], MD;  Location: [place] [facility];  Service: Cardiovascular;  Laterality: N/A;    SOCIAL HISTORY: Social History   Socioeconomic History  . Marital status: Married    Spouse name: Vicente Males   . Number of children: 7  . Years of education: 48  . Highest education level: Not on file  Occupational History  . Occupation: [employer]   Tobacco Use  . Smoking status: Never Smoker  . Smokeless tobacco: Never Used  Substance and Sexual Activity  . Alcohol use: No  . Drug use: No  . Sexual activity: Not on file  Other Topics Concern  . Not on file  Social History Narrative   Lives with mother, wife and sister   Caffeine use: sodas (2 per day)   Social Determinants of Health   Financial Resource Strain: Low Risk   . Difficulty of Paying Living Expenses: Not hard at all  Food Insecurity: No Food Insecurity  . Worried About Charity fundraiser in the Last Year: Never true  . Ran Out of Food in the Last Year: Never true  Transportation Needs: Unmet Transportation Needs  . Lack of Transportation (Medical): Yes  . Lack of Transportation (Non-Medical): Yes  Physical Activity: Unknown  . Days of Exercise per Week: 0 days  . Minutes of Exercise per Session: Not on file  Stress: No Stress Concern Present  . Feeling of Stress : Only a little  Social Connections: Unknown  . Frequency of Communication with Friends and Family: More than three times a week  . Frequency of Social Gatherings with Friends and Family: Not on file  . Attends Religious Services: Not on file  . Active Member of Clubs or Organizations: Not on file  . Attends Archivist Meetings: Not on file  . Marital Status: Married  Human resources officer Violence: Not  At Risk  . Fear of Current or Ex-Partner: No  . Emotionally Abused: No  . Physically Abused: No  . Sexually Abused: No    FAMILY HISTORY: Family History  Problem Relation Age of Onset  . Cancer Paternal Grandmother     ALLERGIES:  is allergic to ibuprofen.  MEDICATIONS:  Current Outpatient Medications  Medication Sig Dispense Refill  . acetaminophen (TYLENOL) 650 MG CR tablet Take 650 mg by mouth See admin instructions. Take 1 tablet (650 mg) by mouth scheduled for arthritis pain, may take an additional dose if needed for pain.    Marland Kitchen allopurinol (ZYLOPRIM) 300 MG tablet Take 300 mg by mouth daily.    Marland Kitchen aspirin 81 MG chewable tablet Chew 81 mg by mouth daily.    Marland Kitchen atorvastatin (LIPITOR) 20 MG tablet Take 20 mg by mouth daily.    . cephALEXin (KEFLEX) 500 MG capsule Take 1 capsule (500 mg total) by mouth 3 (three) times daily for 10 days. 63 capsule 0  . clonazePAM (KLONOPIN) 0.5 MG tablet 1 tablet in the morning, 2 in the evening (Patient taking differently: Take  0.5-1 mg by mouth 2 (two) times daily as needed (anxiety). ) 90 tablet 3  . hydrocortisone 2.5 % cream Apply 1 application topically daily as needed (for itching).     Marland Kitchen ketoconazole (NIZORAL) 2 % cream Apply 1 application topically daily as needed for irritation.     Marland Kitchen lisinopril (ZESTRIL) 20 MG tablet Take 20 mg by mouth daily.    . metoprolol succinate (TOPROL-XL) 25 MG 24 hr tablet Take 25 mg by mouth daily.     . Multiple Vitamin (MULTIVITAMIN WITH MINERALS) TABS tablet Take 1 tablet by mouth daily. Centrum Silver    . ondansetron (ZOFRAN) 8 MG tablet Take 1 tablet (8 mg total) by mouth every 8 (eight) hours as needed for nausea, vomiting or refractory nausea / vomiting. 30 tablet 0  . oxyCODONE (OXY IR/ROXICODONE) 5 MG immediate release tablet Take 1-2 tablets (5-10 mg total) by mouth every 6 (six) hours as needed for up to 7 days for moderate pain or severe pain. 30 tablet 0  . traMADol (ULTRAM) 50 MG tablet Take 1 tablet  (50 mg total) by mouth every 6 (six) hours as needed. 30 tablet 0  . lidocaine (LIDODERM) 5 % Place 1 patch onto the skin every 12 (twelve) hours. Remove & Discard patch within 12 hours or as directed by MD (Patient not taking: Reported on 04/10/2019) 10 patch 0   No current facility-administered medications for this visit.     PHYSICAL EXAMINATION: ECOG PERFORMANCE STATUS: 1 - Symptomatic but completely ambulatory Vitals:   04/23/19 0847  BP: (!) 147/69  Pulse: 90  Resp: 18   Filed Weights   04/23/19 0847  Weight: 218 lb 9.6 oz (99.2 kg)    Physical Exam Constitutional:      General: He is not in acute distress. HENT:     Head: Normocephalic and atraumatic.  Eyes:     General: No scleral icterus.    Pupils: Pupils are equal, round, and reactive to light.  Cardiovascular:     Rate and Rhythm: Normal rate and regular rhythm.     Heart sounds: Normal heart sounds.  Pulmonary:     Effort: Pulmonary effort is normal. No respiratory distress.     Breath sounds: No wheezing.  Abdominal:     General: Bowel sounds are normal. There is no distension.     Palpations: Abdomen is soft. There is no mass.     Tenderness: There is no abdominal tenderness.     Comments: Status post left groin mass resection, covered in dressing with drainage tube   Musculoskeletal:        General: No deformity. Normal range of motion.     Cervical back: Normal range of motion and neck supple.  Skin:    General: Skin is warm and dry.     Findings: No erythema or rash.  Neurological:     Mental Status: He is alert and oriented to person, place, and time.     Cranial Nerves: No cranial nerve deficit.     Coordination: Coordination normal.  Psychiatric:        Behavior: Behavior normal.        Thought Content: Thought content normal.    LABORATORY DATA:  I have reviewed the data as listed Lab Results  Component Value Date   WBC 11.3 (H) 04/17/2019   HGB 9.7 (L) 04/17/2019   HCT 30.1 (L)  04/17/2019   MCV 89.3 04/17/2019   PLT 311 04/17/2019   Recent  Labs    02/03/19 1144 02/03/19 1144 04/14/19 0949 04/16/19 1917 04/17/19 0720  NA 137  --  139  --  137  K 4.0  --  4.7  --  4.6  CL 101  --  104  --  102  CO2 26  --  24  --  23  GLUCOSE 128*  --  100*  --  186*  BUN 26*  --  18  --  25*  CREATININE 1.46*   < > 1.13 1.73* 1.39*  CALCIUM 9.5  --  9.5  --  8.8*  GFRNONAA 52*   < > >60 42* 55*  GFRAA >60   < > >60 49* >60  PROT 8.1  --  7.0  --  6.6  ALBUMIN 4.1  --  3.7  --  3.3*  AST 21  --  25  --  27  ALT 22  --  30  --  24  ALKPHOS 79  --  83  --  79  BILITOT 0.6  --  0.6  --  0.2*   < > = values in this interval not displayed.   Iron/TIBC/Ferritin/ %Sat No results found for: IRON, TIBC, FERRITIN, IRONPCTSAT    RADIOGRAPHIC STUDIES: I have personally reviewed the radiological images as listed and agreed with the findings in the report. MR Brain W Wo Contrast  Result Date: 03/10/2019 CLINICAL DATA:  Malignant melanoma screening. EXAM: MRI HEAD WITHOUT AND WITH CONTRAST TECHNIQUE: Multiplanar, multiecho pulse sequences of the brain and surrounding structures were obtained without and with intravenous contrast. CONTRAST:  10mL GADAVIST GADOBUTROL 1 MMOL/ML IV SOLN COMPARISON:  CT head 12/04/2017 FINDINGS: Brain: No acute infarction, hemorrhage, hydrocephalus, extra-axial collection or mass lesion. Normal enhancement following contrast infusion. Negative for metastatic disease. Vascular: Normal arterial flow voids. Skull and upper cervical spine: Negative Sinuses/Orbits: Mild mucosal edema paranasal sinuses. Negative orbit Other: None IMPRESSION: Negative MRI brain with contrast.  Negative for metastatic disease. Electronically Signed   By: Franchot Gallo M.D.   On: 03/10/2019 14:40   NM PET Image Initial (PI) Skull Base To Thigh  Result Date: 02/11/2019 CLINICAL DATA:  Initial treatment strategy for inguinal mass. Remote history of left lower extremity melanoma  (2012). EXAM: NUCLEAR MEDICINE PET SKULL BASE TO THIGH TECHNIQUE: 12.08 mCi F-18 FDG was injected intravenously. Full-ring PET imaging was performed from the skull base to thigh after the radiotracer. CT data was obtained and used for attenuation correction and anatomic localization. Fasting blood glucose: 100 mg/dl COMPARISON:  CT scan 01/31/2019 FINDINGS: Mediastinal blood pool activity: SUV max 2.65 Liver activity: SUV max NA NECK: No hypermetabolic lymph nodes in the neck. Incidental CT findings: none CHEST: There are scattered bilateral axillary lymph nodes which demonstrate normal architecture with fatty hila. No hypermetabolism to suggest metastatic lymphadenopathy. No supraclavicular adenopathy. No enlarged or hypermetabolic mediastinal or hilar lymph nodes. No worrisome pulmonary lesions or hypermetabolic lesions to suggest neoplasm. Incidental CT findings: Pacer wires in good position. Scattered aortic and coronary artery calcifications. ABDOMEN/PELVIS: No abnormal hypermetabolic activity within the liver, pancreas, adrenal glands, or spleen. No hypermetabolic lymph nodes in the abdomen. Small low-attenuation lesion in the right hepatic lobe on image number 131/3 measures 6.8 Hounsfield units and is consistent with a benign hepatic cyst. Large left inguinal mass measuring a maximum of 11.5 cm is markedly hypermetabolic with SUV max of 0000000. Findings consistent with neoplasm and would certainly be concerned about recurrent melanoma. There are several small adjacent scattered  lymph nodes which are weakly hypermetabolic. No right-sided inguinal adenopathy. 7.5 mm left operator lymph node is not hypermetabolic. 7.5 mm lateral external iliac lymph node on image 234/3 is not hypermetabolic. 7.5 mm lateral left external iliac lymph node on image 230/3 is mildly hypermetabolic with SUV max of 3.0. Incidental CT findings: Moderate atherosclerotic calcifications involving the aorta and iliac arteries but no  aneurysm. Moderate-sized periumbilical abdominal wall hernia containing fat. SKELETON: No focal hypermetabolic activity to suggest skeletal metastasis. Incidental CT findings: none IMPRESSION: 1. 11.5 cm left inguinal soft tissue mass is markedly hypermetabolic and consistent with neoplasm. Recurrent/metastatic melanoma is certainly a strong consideration. Lymphoma would be another possibility. Small adjacent lateral external iliac lymph nodes are weakly hypermetabolic but no definite pathologic adenopathy in the chest or abdomen. 2. No findings suspicious for metastatic pulmonary nodules. Electronically Signed   By: Marijo Sanes M.D.   On: 02/11/2019 12:32   [place] LT LOWER EXTREM LTD SOFT TISSUE NON VASCULAR  Result Date: 01/31/2019 CLINICAL DATA:  Left groin mass. EXAM: ULTRASOUND left LOWER EXTREMITY LIMITED TECHNIQUE: Ultrasound examination of the lower extremity soft tissues was performed in the area of clinical concern. COMPARISON:  None FINDINGS: Limited ultrasound imaging of the left groin was performed displaying a heterogeneous masslike area with lobulated contours measuring 11.2 x 7.5 x 6.5 cm. Internal flow is appreciated on color Doppler imaging. No surrounding fluid. This appears to be anterior to femoral vasculature. IMPRESSION: Large mass in the left groin with internal flow. Anterior to vessels in the left groin following vascular access in June. Nodal mass is favored. Suggest CT angiogram of this area to assess for other areas of nodal enlargement and to exclude vascular injury with chronic sequela. Electronically Signed   By: Zetta Bills M.D.   On: 01/31/2019 08:39   [place] CORE BIOPSY (SOFT TISSUE)  Result Date: 02/12/2019 CLINICAL DATA:  History of left calf melanoma. Left inguinal adenopathy. EXAM: ULTRASOUND GUIDED CORE BIOPSY OF LEFT INGUINAL ADENOPATHY MEDICATIONS: Intravenous Fentanyl 164mcg and Versed 2mg  were administered as conscious sedation during continuous monitoring of the  patient's level of consciousness and physiological / cardiorespiratory status by the radiology RN, with a total moderate sedation time of 11 minutes. PROCEDURE: The procedure, risks, benefits, and alternatives were explained to the patient. Questions regarding the procedure were encouraged and answered. The patient understands and consents to the procedure. Survey ultrasound of the left groin performed. The enlarged inguinal node/mass was localized and an appropriate site was determined and marked. The operative field was prepped with chlorhexidine in a sterile fashion, and a sterile drape was applied covering the operative field. A sterile gown and sterile gloves were used for the procedure. Local anesthesia was provided with 1% Lidocaine. Under real-time ultrasound guidance, a 17 gauge trocar needle was advanced to the margin of the lesion. Once needle tip position was confirmed, coaxial 18-gauge core biopsy samples were obtained, submitted in saline to surgical pathology. The guide needle was removed. The patient tolerated the procedure well. COMPLICATIONS: None. FINDINGS: Vascular left inguinal mass was localized. Representative core biopsy samples obtained as above IMPRESSION: 1. Technically successful ultrasound-guided core biopsy, left inguinal adenopathy/mass. Electronically Signed   By: Lucrezia Europe M.D.   On: 02/12/2019 11:37   CT Angio Abd/Pel w/ and/or w/o  Result Date: 01/31/2019 CLINICAL DATA:  59 year old male with a history of left inguinal mass EXAM: CTA ABDOMEN AND PELVIS WITHOUT AND WITH CONTRAST TECHNIQUE: Multidetector CT imaging of the abdomen and pelvis was performed  using the standard protocol during bolus administration of intravenous contrast. Multiplanar reconstructed images and MIPs were obtained and reviewed to evaluate the vascular anatomy. CONTRAST:  158mL OMNIPAQUE IOHEXOL 350 MG/ML SOLN COMPARISON:  None. FINDINGS: VASCULAR Aorta: Unremarkable course, caliber, contour of the  abdominal aorta. No dissection, aneurysm, or periaortic fluid. Minimal atherosclerosis. Celiac: Patent, with no significant atherosclerotic changes. SMA: Patent, with no significant atherosclerotic changes. Renals: Renal arteries are patent. IMA: Inferior mesenteric artery is patent. Right lower extremity: Unremarkable course, caliber, and contour of the right iliac system. No aneurysm, dissection, or occlusion. Hypogastric artery is patent. Common femoral artery patent. Proximal SFA and profunda femoris patent. Left lower extremity: Unremarkable course, caliber, and contour of the left iliac system. No aneurysm, dissection, or occlusion. Hypogastric artery is patent. Common femoral artery patent. Proximal SFA and profunda femoris patent. Veins: Unremarkable appearance of the venous system. Review of the MIP images confirms the above findings. NON-VASCULAR Lower chest: No acute. Hepatobiliary: Small hypodense lesion in the lateral right liver measures 9 mm on image 45 of series 4. Additional hypodense lesion at the inferior right liver measures 8 mm. Unremarkable gall bladder. Pancreas: Unremarkable. Spleen: Unremarkable. Adrenals/Urinary Tract: Unremarkable appearance of adrenal glands. Right: No hydronephrosis. Symmetric perfusion to the left. No nephrolithiasis. Unremarkable course of the right ureter. Left: No hydronephrosis. Symmetric perfusion to the right. No nephrolithiasis. Unremarkable course of the left ureter. Unremarkable appearance of the urinary bladder . Stomach/Bowel: Hiatal hernia with otherwise unremarkable stomach unremarkable appearance of small bowel. No evidence of obstruction. Normal appendix. Colonic diverticula without associated inflammatory changes. Lymphatic: Heterogeneously attenuating/enhancing mass within the superficial left inguinal region/proximal thigh. Greatest axial dimension of 11.6 cm. AP dimension estimated 7.4 cm. Fat plane maintained between the proximal thigh musculature  and the lesion. There are additional small nodules/lymph nodes within the inguinal region with edema in the adjacent fat. Iliac nodes including both internal and external iliac nodes borderline enlarged. Suspicious by number. Mesenteric: No free fluid or air. No mesenteric adenopathy. Reproductive: Unremarkable appearance of the pelvic organs. Other: Fat containing umbilical hernia. Musculoskeletal: No acute displaced fracture. No aggressive lytic or sclerotic lesions. IMPRESSION: No acute CT finding. Left inguinal mass with diameter as large as 11.6 cm. Leading differential would be lymphoma, although other malignancy such as mesenchymal tumors/sarcoma could have this appearance. There are associated left inguinal and left iliac lymph nodes which are suspicious for involvement. Referral for oncologic workup is indicated. There are 2 small nonspecific hypodense lesions within the right liver identified, nonspecific. Correlation with either PET-CT or contrast-enhanced liver MR may be considered. Aortic Atherosclerosis (ICD10-I70.0). Electronically Signed   By: Corrie Mckusick D.O.   On: 01/31/2019 10:43       ASSESSMENT & PLAN:  1. Malignant melanoma of overlapping sites ([place])   2. Goals of care, counseling/discussion   3. Anemia, unspecified type    #Left inguinal nodal recurrence of melanoma. Resection pathology showed malignant melanoma, replacing a lymph node, with extracapsular extension, peripheral and deep margins involved.  Left inguinal contents, all 7 lymph nodes were negative for melanoma in the lymph nodes.  Extranodal melanoma identified in lymphatic and interstitium between nodes. Pathology reports were available after patient's clinic visit today. I called and discussed with pathologist and had a prelim results and discussed with patient and his wife. I will discuss with surgery to see if any additional resection is feasible.  Recommend adjuvant radiation oncology.  Will refer patient to  establish care. Discussed about systemic treatment with  immunotherapy. I discussed the mechanism of action and rationale of using immunotherapy.   Discussed the potential side effects of immunotherapy NIvolumab including but not limited to diarrhea; skin rash; respiratory failure, kidney failure, mental status change, elevated LFTs/liver failure,endocrine abnormalities, acute deterioration  and even death,etc. patient voices understanding and agrees with proceeding with treatment.  Anemia, acute on chronic, multifactorial likely secondary to blood loss, .  Orders Placed This Encounter  Procedures  . Ambulatory referral to Radiation Oncology    Referral Priority:   Routine    Referral Type:   Consultation    Referral Reason:   Specialty Services Required    Referred to Provider:   Noreene Filbert, MD    Requested Specialty:   Radiation Oncology    Number of Visits Requested:   1    All questions were answered. The patient knows to call the clinic with any problems questions or concerns.  Cc [name], [name], MD   Return of visit: To be determined. We spent sufficient time to discuss many aspect of care, questions were answered to patient's satisfaction.   [name], MD, PhD Hematology Oncology [facility] Pager- [phone] 04/23/2019

## 2019-04-25 ENCOUNTER — Ambulatory Visit: Admitting: Oncology

## 2019-04-25 ENCOUNTER — Telehealth: Payer: Self-pay

## 2019-04-25 NOTE — Telephone Encounter (Signed)
Patient left [employer] form to be filled out after his last MD visit.  The same form was filled out and returned to [employer] on 04/16/2019.  I left a message with [employer] on 04/23/19 to make sure form was received and if any more information needed but I have not received a return call.  Called patient to let him know the form was sent back and for him to contact his HR.

## 2019-04-29 ENCOUNTER — Other Ambulatory Visit: Payer: Self-pay

## 2019-04-29 ENCOUNTER — Encounter: Payer: Self-pay | Admitting: Radiation Oncology

## 2019-04-29 NOTE — Progress Notes (Signed)
Patient pre screened for office appointment, no questions or concerns today. Patient reminded of upcoming appointment time and date. 

## 2019-04-30 ENCOUNTER — Other Ambulatory Visit: Payer: Self-pay

## 2019-04-30 ENCOUNTER — Ambulatory Visit
Admission: RE | Admit: 2019-04-30 | Discharge: 2019-04-30 | Disposition: A | Discharge status: Home / Self Care | Payer: BC Managed Care – PPO | Source: Ambulatory Visit | Attending: Radiation Oncology | Admitting: Radiation Oncology

## 2019-04-30 DIAGNOSIS — C439 Malignant melanoma of skin, unspecified: Secondary | ICD-10-CM | POA: Insufficient documentation

## 2019-04-30 DIAGNOSIS — Z79899 Other long term (current) drug therapy: Secondary | ICD-10-CM | POA: Diagnosis not present

## 2019-04-30 DIAGNOSIS — Z803 Family history of malignant neoplasm of breast: Secondary | ICD-10-CM | POA: Diagnosis not present

## 2019-04-30 DIAGNOSIS — I10 Essential (primary) hypertension: Secondary | ICD-10-CM | POA: Insufficient documentation

## 2019-04-30 DIAGNOSIS — Z7982 Long term (current) use of aspirin: Secondary | ICD-10-CM | POA: Diagnosis not present

## 2019-04-30 DIAGNOSIS — C779 Secondary and unspecified malignant neoplasm of lymph node, unspecified: Secondary | ICD-10-CM | POA: Diagnosis not present

## 2019-04-30 DIAGNOSIS — F419 Anxiety disorder, unspecified: Secondary | ICD-10-CM | POA: Diagnosis not present

## 2019-04-30 DIAGNOSIS — M129 Arthropathy, unspecified: Secondary | ICD-10-CM | POA: Diagnosis not present

## 2019-04-30 NOTE — Consult Note (Signed)
NEW PATIENT EVALUATION  Name: Edward Pitts  MRN: [id number]  Date:   04/30/2019     DOB: 05-28-1960   This 59 y.o. male patient presents to the clinic for initial evaluation of postoperative radiation therapy to left groin and patient with large area of metastatic malignant melanoma.  REFERRING PHYSICIAN: [name], [name], MD  CHIEF COMPLAINT:  Chief Complaint  Patient presents with  . 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   PREVIOUS INVESTIGATIONS:  PET CT scan reviewed Pathology report reviewed Clinical notes reviewed  HPI: Patient is a 59 year old male with history of malignant melanoma of his left lower extremity in the past.  He developed a bulging mass in his left groin showing an 11.5 cm left inguinal soft tissue mass markedly hypermetabolic consistent with malignancy.  No findings for metastatic disease elsewhere was noted.  Brain MRI was negative for malignant metastatic disease.  Patient underwent surgical resection showing malignant melanoma replacing lymph node with extracapsular extension with peripheral and deep margins involved.  Extranodal melanoma was identified in lymphatics and interstitium between nodes.  Patient has done well postoperatively although still has a drain in place.  I been asked to evaluate him for possible postoperative radiation therapy.  He is ambulating now with the assistance of a cane.  Has some mild swelling above the knees of the left lower extremity.  PLANNED TREATMENT REGIMEN: Adjuvant radiation therapy to left groin  PAST MEDICAL HISTORY:  has a past medical history of Anxiety, Arthritis, Complication of anesthesia, Family history of adverse reaction to anesthesia, Hyperlipidemia, Hypertension, Melanoma ([place]) (2012), PONV (postoperative nausea and vomiting) (04/16/2019), Presence of permanent cardiac pacemaker, and Seizures ([place]).    PAST SURGICAL HISTORY:   Past Surgical History:  Procedure Laterality Date  . KNEE SURGERY Left   . LEFT HEART CATH AND CORONARY ANGIOGRAPHY Left 06/29/2017   Procedure: LEFT HEART CATH AND CORONARY ANGIOGRAPHY;  Surgeon: [name], MD;  Location: [facility];  Service: Cardiovascular;  Laterality: Left;  . LYMPH NODE DISSECTION Left 04/16/2019   Procedure: Left inguinal Lymph Node Dissection;  Surgeon: [name], MD;  Location: [facility];  Service: General;  Laterality: Left;  Marland Kitchen MELANOMA EXCISION Left 04/16/2019   Procedure: MELANOMA EXCISION LEFT GROIN MASS;  Surgeon: [name], MD;  Location: [facility];  Service: General;  Laterality: Left;  Marland Kitchen MELANOMA EXCISION WITH SENTINEL LYMPH NODE BIOPSY Left 2012   Left calf   . PACEMAKER INSERTION N/A 08/26/2018   Procedure: INSERTION PACEMAKER;  Surgeon: [name], MD;  Location: [facility];  Service: Cardiovascular;  Laterality: N/A;  . TEMPORARY PACEMAKER N/A 08/25/2018   Procedure: TEMPORARY PACEMAKER;  Surgeon: [name], MD;  Location: [facility];  Service: Cardiovascular;  Laterality: N/A;    FAMILY HISTORY: family history includes Cancer in his paternal grandmother.  SOCIAL HISTORY:  reports that he has never smoked. He has never used smokeless tobacco. He reports that he does not drink alcohol or use drugs.  ALLERGIES: Ibuprofen  MEDICATIONS:  Current Outpatient Medications  Medication Sig Dispense Refill  . acetaminophen (TYLENOL) 650 MG CR tablet Take 650 mg by mouth See admin instructions. Take 1 tablet (650 mg) by mouth scheduled for arthritis pain, may take an additional dose if needed for pain.    Marland Kitchen allopurinol (ZYLOPRIM) 300 MG tablet Take 300 mg by mouth daily.    Marland Kitchen aspirin 81 MG chewable  tablet Chew 81 mg by mouth daily.    Marland Kitchen atorvastatin (LIPITOR) 20 MG tablet Take 20 mg by mouth daily.    . clonazePAM (KLONOPIN) 0.5 MG tablet 1 tablet in the morning, 2 in the evening (Patient taking differently: Take 0.5-1 mg by  mouth 2 (two) times daily as needed (anxiety). ) 90 tablet 3  . hydrocortisone 2.5 % cream Apply 1 application topically daily as needed (for itching).     Marland Kitchen ketoconazole (NIZORAL) 2 % cream Apply 1 application topically daily as needed for irritation.     Marland Kitchen lisinopril (ZESTRIL) 20 MG tablet Take 20 mg by mouth daily.    . metoprolol succinate (TOPROL-XL) 25 MG 24 hr tablet Take 25 mg by mouth daily.     . Multiple Vitamin (MULTIVITAMIN WITH MINERALS) TABS tablet Take 1 tablet by mouth daily. Centrum Silver    . ondansetron (ZOFRAN) 8 MG tablet Take 1 tablet (8 mg total) by mouth every 8 (eight) hours as needed for nausea, vomiting or refractory nausea / vomiting. 30 tablet 0  . traMADol (ULTRAM) 50 MG tablet Take 1 tablet (50 mg total) by mouth every 6 (six) hours as needed. 30 tablet 0  . lidocaine (LIDODERM) 5 % Place 1 patch onto the skin every 12 (twelve) hours. Remove & Discard patch within 12 hours or as directed by MD (Patient not taking: Reported on 04/10/2019) 10 patch 0   No current facility-administered medications for this encounter.    ECOG PERFORMANCE STATUS:  1 - Symptomatic but completely ambulatory  REVIEW OF SYSTEMS: Patient denies any weight loss, fatigue, weakness, fever, chills or night sweats. Patient denies any loss of vision, blurred vision. Patient denies any ringing  of the ears or hearing loss. No irregular heartbeat. Patient denies heart murmur or history of fainting. Patient denies any chest pain or pain radiating to her upper extremities. Patient denies any shortness of breath, difficulty breathing at night, cough or hemoptysis. Patient denies any swelling in the lower legs. Patient denies any nausea vomiting, vomiting of blood, or coffee ground material in the vomitus. Patient denies any stomach pain. Patient states has had normal bowel movements no significant constipation or diarrhea. Patient denies any dysuria, hematuria or significant nocturia. Patient denies any  problems walking, swelling in the joints or loss of balance. Patient denies any skin changes, loss of hair or loss of weight. Patient denies any excessive worrying or anxiety or significant depression. Patient denies any problems with insomnia. Patient denies excessive thirst, polyuria, polydipsia. Patient denies any swollen glands, patient denies easy bruising or easy bleeding. Patient denies any recent infections, allergies or URI. Patient "s visual fields have not changed significantly in recent time.   PHYSICAL EXAM: BP (P) 137/61 (BP Location: Left Arm)   Resp (!) (P) 78   Ht (P) 5\' 7"  (1.702 m)   Wt (P) 222 lb 14.4 oz (101.1 kg)   BMI (P) 34.91 kg/m  Patient has a drain in bandaging in place in the left groin.  No real evidence of lymphedema in his left lower extremity except some mild swelling above the left knee.  Well-developed well-nourished patient in NAD. HEENT reveals PERLA, EOMI, discs not visualized.  Oral cavity is clear. No oral mucosal lesions are identified. Neck is clear without evidence of cervical or supraclavicular adenopathy. Lungs are clear to A&P. Cardiac examination is essentially unremarkable with regular rate and rhythm without murmur rub or thrill. Abdomen is benign with no organomegaly or masses noted. Motor  sensory and DTR levels are equal and symmetric in the upper and lower extremities. Cranial nerves II through XII are grossly intact. Proprioception is intact. No peripheral adenopathy or edema is identified. No motor or sensory levels are noted. Crude visual fields are within normal range.  LABORATORY DATA: Pathology report reviewed    RADIOLOGY RESULTS: PET CT scan and MRI of brain reviewed compatible with above-stated findings   IMPRESSION: Metastatic malignant melanoma to left groin status post resection with positive margins as well as extracapsular extension in 59 year old male  PLAN: At this time I have recommended adjuvant radiation therapy.  This is based  on previous data which I have copied showing excellent postoperative decrease in local regional recurrence with adjuvant radiation therapy. The median number of resected LN was 12 (1 to 36) with 2 metastases (1 to 28). Median survival after the first relapse was 31.8 months. Extracapsular extension was a significant prognostic factor for regional control (p = 0.019). Median total dose was 50 Gy (30 to 70 Gy). A standard fractionation regimen was used (2 Gy/fraction). Median number of fractions was 25 (10 to 44 fractions). Patients were treated with five fractions/week. Patients with extracapsular extension treated with surgery followed by RT (total dose ?50 Gy) had a better regional control than patients treated by surgery followed by RT with a total dose <50 Gy (80% vs. 35% at 5-year follow-up; p = 0.004).  Based on this data I would plan on delivering 6400 cGy in 32 fractions based on the past factor positive margins extracapsular extension and residual disease.  Risks and benefits of treatment including possible lymphedema of his left lower extremity skin reaction fatigue alteration of blood counts all were discussed in detail with the patient.  I have set up and ordered CT simulation for next week after he has his drain removed and I can evaluate his scar to begin radiation therapy treatment planning.  Patient also may be a candidate for immunotherapy under medical oncology's direction.  Patient comprehends my treatment plan well.  I would like to take this opportunity to thank you for allowing me to participate in the care of your patient.Noreene Filbert, MD

## 2019-05-08 ENCOUNTER — Ambulatory Visit: Payer: BC Managed Care – PPO

## 2019-05-14 ENCOUNTER — Ambulatory Visit: Payer: BC Managed Care – PPO

## 2019-05-14 ENCOUNTER — Other Ambulatory Visit: Payer: Self-pay

## 2019-05-14 DIAGNOSIS — C439 Malignant melanoma of skin, unspecified: Secondary | ICD-10-CM | POA: Diagnosis not present

## 2019-05-15 ENCOUNTER — Other Ambulatory Visit: Payer: Self-pay | Admitting: *Deleted

## 2019-05-15 ENCOUNTER — Ambulatory Visit: Payer: BC Managed Care – PPO

## 2019-05-15 DIAGNOSIS — C439 Malignant melanoma of skin, unspecified: Secondary | ICD-10-CM

## 2019-05-19 DIAGNOSIS — C439 Malignant melanoma of skin, unspecified: Secondary | ICD-10-CM | POA: Diagnosis not present

## 2019-05-21 ENCOUNTER — Ambulatory Visit: Admission: RE | Admit: 2019-05-21 | Payer: BC Managed Care – PPO | Source: Ambulatory Visit

## 2019-05-21 ENCOUNTER — Other Ambulatory Visit: Payer: Self-pay

## 2019-05-21 DIAGNOSIS — C439 Malignant melanoma of skin, unspecified: Secondary | ICD-10-CM | POA: Diagnosis not present

## 2019-05-22 ENCOUNTER — Other Ambulatory Visit: Payer: Self-pay

## 2019-05-22 ENCOUNTER — Ambulatory Visit
Admission: RE | Admit: 2019-05-22 | Discharge: 2019-05-22 | Disposition: A | Discharge status: Home / Self Care | Payer: BC Managed Care – PPO | Source: Ambulatory Visit | Attending: Radiation Oncology | Admitting: Radiation Oncology

## 2019-05-22 DIAGNOSIS — C439 Malignant melanoma of skin, unspecified: Secondary | ICD-10-CM | POA: Diagnosis not present

## 2019-05-23 ENCOUNTER — Ambulatory Visit
Admission: RE | Admit: 2019-05-23 | Discharge: 2019-05-23 | Disposition: A | Discharge status: Home / Self Care | Payer: BC Managed Care – PPO | Source: Ambulatory Visit | Attending: Radiation Oncology | Admitting: Radiation Oncology

## 2019-05-23 ENCOUNTER — Other Ambulatory Visit: Payer: Self-pay

## 2019-05-23 DIAGNOSIS — C439 Malignant melanoma of skin, unspecified: Secondary | ICD-10-CM | POA: Diagnosis not present

## 2019-05-26 ENCOUNTER — Inpatient Hospital Stay: Payer: BC Managed Care – PPO | Attending: Oncology

## 2019-05-26 ENCOUNTER — Other Ambulatory Visit: Payer: Self-pay

## 2019-05-26 ENCOUNTER — Ambulatory Visit
Admission: RE | Admit: 2019-05-26 | Discharge: 2019-05-26 | Disposition: A | Discharge status: Home / Self Care | Payer: BC Managed Care – PPO | Source: Ambulatory Visit | Attending: Radiation Oncology | Admitting: Radiation Oncology

## 2019-05-26 DIAGNOSIS — Z803 Family history of malignant neoplasm of breast: Secondary | ICD-10-CM | POA: Insufficient documentation

## 2019-05-26 DIAGNOSIS — C4372 Malignant melanoma of left lower limb, including hip: Secondary | ICD-10-CM | POA: Diagnosis present

## 2019-05-26 DIAGNOSIS — I10 Essential (primary) hypertension: Secondary | ICD-10-CM | POA: Insufficient documentation

## 2019-05-26 DIAGNOSIS — C779 Secondary and unspecified malignant neoplasm of lymph node, unspecified: Secondary | ICD-10-CM | POA: Insufficient documentation

## 2019-05-26 DIAGNOSIS — C439 Malignant melanoma of skin, unspecified: Secondary | ICD-10-CM | POA: Insufficient documentation

## 2019-05-26 DIAGNOSIS — M129 Arthropathy, unspecified: Secondary | ICD-10-CM | POA: Diagnosis not present

## 2019-05-26 DIAGNOSIS — C774 Secondary and unspecified malignant neoplasm of inguinal and lower limb lymph nodes: Secondary | ICD-10-CM | POA: Insufficient documentation

## 2019-05-26 DIAGNOSIS — F419 Anxiety disorder, unspecified: Secondary | ICD-10-CM | POA: Insufficient documentation

## 2019-05-26 DIAGNOSIS — Z7982 Long term (current) use of aspirin: Secondary | ICD-10-CM | POA: Diagnosis not present

## 2019-05-26 DIAGNOSIS — Z79899 Other long term (current) drug therapy: Secondary | ICD-10-CM | POA: Diagnosis not present

## 2019-05-26 LAB — CBC
HCT: 34.2 % — ABNORMAL LOW (ref 39.0–52.0)
Hemoglobin: 10.6 g/dL — ABNORMAL LOW (ref 13.0–17.0)
MCH: 28.3 pg (ref 26.0–34.0)
MCHC: 31 g/dL (ref 30.0–36.0)
MCV: 91.4 fL (ref 80.0–100.0)
Platelets: 258 10*3/uL (ref 150–400)
RBC: 3.74 MIL/uL — ABNORMAL LOW (ref 4.22–5.81)
RDW: 13.5 % (ref 11.5–15.5)
WBC: 4.8 10*3/uL (ref 4.0–10.5)
nRBC: 0 % (ref 0.0–0.2)

## 2019-05-27 ENCOUNTER — Telehealth: Payer: Self-pay

## 2019-05-27 ENCOUNTER — Ambulatory Visit
Admission: RE | Admit: 2019-05-27 | Discharge: 2019-05-27 | Disposition: A | Discharge status: Home / Self Care | Payer: BC Managed Care – PPO | Source: Ambulatory Visit | Attending: Radiation Oncology | Admitting: Radiation Oncology

## 2019-05-27 ENCOUNTER — Other Ambulatory Visit: Payer: Self-pay

## 2019-05-27 DIAGNOSIS — C439 Malignant melanoma of skin, unspecified: Secondary | ICD-10-CM | POA: Diagnosis not present

## 2019-05-27 NOTE — Telephone Encounter (Signed)
Patient completes radiation treatment on 4/12. Please schedule him for lab/MD (cbc,cmp) 1 week after. Please notify pt. Thanks

## 2019-05-28 ENCOUNTER — Other Ambulatory Visit: Payer: Self-pay

## 2019-05-28 ENCOUNTER — Ambulatory Visit
Admission: RE | Admit: 2019-05-28 | Discharge: 2019-05-28 | Disposition: A | Discharge status: Home / Self Care | Payer: BC Managed Care – PPO | Source: Ambulatory Visit | Attending: Radiation Oncology | Admitting: Radiation Oncology

## 2019-05-28 DIAGNOSIS — C439 Malignant melanoma of skin, unspecified: Secondary | ICD-10-CM | POA: Diagnosis not present

## 2019-05-28 NOTE — Telephone Encounter (Signed)
Lab/MD 1 week after Radiation has been scheduled as requested. Pt is aware of date and time.

## 2019-05-29 ENCOUNTER — Other Ambulatory Visit: Payer: Self-pay

## 2019-05-29 ENCOUNTER — Ambulatory Visit
Admission: RE | Admit: 2019-05-29 | Discharge: 2019-05-29 | Disposition: A | Discharge status: Home / Self Care | Payer: BC Managed Care – PPO | Source: Ambulatory Visit | Attending: Radiation Oncology | Admitting: Radiation Oncology

## 2019-05-29 DIAGNOSIS — C439 Malignant melanoma of skin, unspecified: Secondary | ICD-10-CM | POA: Diagnosis not present

## 2019-05-30 ENCOUNTER — Other Ambulatory Visit: Payer: Self-pay

## 2019-05-30 ENCOUNTER — Ambulatory Visit
Admission: RE | Admit: 2019-05-30 | Discharge: 2019-05-30 | Disposition: A | Discharge status: Home / Self Care | Payer: BC Managed Care – PPO | Source: Ambulatory Visit | Attending: Radiation Oncology | Admitting: Radiation Oncology

## 2019-05-30 DIAGNOSIS — C439 Malignant melanoma of skin, unspecified: Secondary | ICD-10-CM | POA: Diagnosis not present

## 2019-06-02 ENCOUNTER — Ambulatory Visit
Admission: RE | Admit: 2019-06-02 | Discharge: 2019-06-02 | Disposition: A | Discharge status: Home / Self Care | Payer: BC Managed Care – PPO | Source: Ambulatory Visit | Attending: Radiation Oncology | Admitting: Radiation Oncology

## 2019-06-02 ENCOUNTER — Other Ambulatory Visit: Payer: Self-pay

## 2019-06-02 ENCOUNTER — Inpatient Hospital Stay: Payer: BC Managed Care – PPO

## 2019-06-02 DIAGNOSIS — C774 Secondary and unspecified malignant neoplasm of inguinal and lower limb lymph nodes: Secondary | ICD-10-CM | POA: Diagnosis not present

## 2019-06-02 DIAGNOSIS — C439 Malignant melanoma of skin, unspecified: Secondary | ICD-10-CM

## 2019-06-02 LAB — CBC
HCT: 34.4 % — ABNORMAL LOW (ref 39.0–52.0)
Hemoglobin: 10.7 g/dL — ABNORMAL LOW (ref 13.0–17.0)
MCH: 28.2 pg (ref 26.0–34.0)
MCHC: 31.1 g/dL (ref 30.0–36.0)
MCV: 90.5 fL (ref 80.0–100.0)
Platelets: 226 10*3/uL (ref 150–400)
RBC: 3.8 MIL/uL — ABNORMAL LOW (ref 4.22–5.81)
RDW: 13.7 % (ref 11.5–15.5)
WBC: 3.9 10*3/uL — ABNORMAL LOW (ref 4.0–10.5)
nRBC: 0 % (ref 0.0–0.2)

## 2019-06-03 ENCOUNTER — Ambulatory Visit
Admission: RE | Admit: 2019-06-03 | Discharge: 2019-06-03 | Disposition: A | Discharge status: Home / Self Care | Payer: BC Managed Care – PPO | Source: Ambulatory Visit | Attending: Radiation Oncology | Admitting: Radiation Oncology

## 2019-06-03 ENCOUNTER — Other Ambulatory Visit: Payer: Self-pay

## 2019-06-03 DIAGNOSIS — C439 Malignant melanoma of skin, unspecified: Secondary | ICD-10-CM | POA: Diagnosis not present

## 2019-06-04 ENCOUNTER — Ambulatory Visit
Admission: RE | Admit: 2019-06-04 | Discharge: 2019-06-04 | Disposition: A | Discharge status: Home / Self Care | Payer: BC Managed Care – PPO | Source: Ambulatory Visit | Attending: Radiation Oncology | Admitting: Radiation Oncology

## 2019-06-04 ENCOUNTER — Other Ambulatory Visit: Payer: Self-pay

## 2019-06-04 DIAGNOSIS — C439 Malignant melanoma of skin, unspecified: Secondary | ICD-10-CM | POA: Diagnosis not present

## 2019-06-05 ENCOUNTER — Ambulatory Visit
Admission: RE | Admit: 2019-06-05 | Discharge: 2019-06-05 | Disposition: A | Discharge status: Home / Self Care | Payer: BC Managed Care – PPO | Source: Ambulatory Visit | Attending: Radiation Oncology | Admitting: Radiation Oncology

## 2019-06-05 DIAGNOSIS — C439 Malignant melanoma of skin, unspecified: Secondary | ICD-10-CM | POA: Diagnosis not present

## 2019-06-06 ENCOUNTER — Ambulatory Visit
Admission: RE | Admit: 2019-06-06 | Discharge: 2019-06-06 | Disposition: A | Discharge status: Home / Self Care | Payer: BC Managed Care – PPO | Source: Ambulatory Visit | Attending: Radiation Oncology | Admitting: Radiation Oncology

## 2019-06-06 DIAGNOSIS — C439 Malignant melanoma of skin, unspecified: Secondary | ICD-10-CM | POA: Diagnosis not present

## 2019-06-09 ENCOUNTER — Ambulatory Visit
Admission: RE | Admit: 2019-06-09 | Discharge: 2019-06-09 | Disposition: A | Discharge status: Home / Self Care | Payer: BC Managed Care – PPO | Source: Ambulatory Visit | Attending: General Surgery | Admitting: General Surgery

## 2019-06-09 ENCOUNTER — Inpatient Hospital Stay: Payer: BC Managed Care – PPO

## 2019-06-09 ENCOUNTER — Other Ambulatory Visit: Payer: Self-pay | Admitting: General Surgery

## 2019-06-09 ENCOUNTER — Other Ambulatory Visit: Payer: Self-pay

## 2019-06-09 ENCOUNTER — Ambulatory Visit
Admission: RE | Admit: 2019-06-09 | Discharge: 2019-06-09 | Disposition: A | Discharge status: Home / Self Care | Payer: BC Managed Care – PPO | Source: Ambulatory Visit | Attending: Radiation Oncology | Admitting: Radiation Oncology

## 2019-06-09 DIAGNOSIS — C774 Secondary and unspecified malignant neoplasm of inguinal and lower limb lymph nodes: Secondary | ICD-10-CM | POA: Diagnosis not present

## 2019-06-09 DIAGNOSIS — C439 Malignant melanoma of skin, unspecified: Secondary | ICD-10-CM

## 2019-06-09 DIAGNOSIS — M7989 Other specified soft tissue disorders: Secondary | ICD-10-CM | POA: Diagnosis present

## 2019-06-09 LAB — CBC
HCT: 34.5 % — ABNORMAL LOW (ref 39.0–52.0)
Hemoglobin: 11.4 g/dL — ABNORMAL LOW (ref 13.0–17.0)
MCH: 29.1 pg (ref 26.0–34.0)
MCHC: 33 g/dL (ref 30.0–36.0)
MCV: 88 fL (ref 80.0–100.0)
Platelets: 186 10*3/uL (ref 150–400)
RBC: 3.92 MIL/uL — ABNORMAL LOW (ref 4.22–5.81)
RDW: 13.7 % (ref 11.5–15.5)
WBC: 4.1 10*3/uL (ref 4.0–10.5)
nRBC: 0 % (ref 0.0–0.2)

## 2019-06-10 ENCOUNTER — Ambulatory Visit
Admission: RE | Admit: 2019-06-10 | Discharge: 2019-06-10 | Disposition: A | Discharge status: Home / Self Care | Payer: BC Managed Care – PPO | Source: Ambulatory Visit | Attending: Radiation Oncology | Admitting: Radiation Oncology

## 2019-06-10 DIAGNOSIS — C439 Malignant melanoma of skin, unspecified: Secondary | ICD-10-CM | POA: Diagnosis not present

## 2019-06-11 ENCOUNTER — Ambulatory Visit
Admission: RE | Admit: 2019-06-11 | Discharge: 2019-06-11 | Disposition: A | Discharge status: Home / Self Care | Payer: BC Managed Care – PPO | Source: Ambulatory Visit | Attending: Radiation Oncology | Admitting: Radiation Oncology

## 2019-06-11 DIAGNOSIS — C439 Malignant melanoma of skin, unspecified: Secondary | ICD-10-CM | POA: Diagnosis not present

## 2019-06-12 ENCOUNTER — Ambulatory Visit
Admission: RE | Admit: 2019-06-12 | Discharge: 2019-06-12 | Disposition: A | Discharge status: Home / Self Care | Payer: BC Managed Care – PPO | Source: Ambulatory Visit | Attending: Radiation Oncology | Admitting: Radiation Oncology

## 2019-06-12 DIAGNOSIS — C439 Malignant melanoma of skin, unspecified: Secondary | ICD-10-CM | POA: Diagnosis not present

## 2019-06-13 ENCOUNTER — Ambulatory Visit
Admission: RE | Admit: 2019-06-13 | Discharge: 2019-06-13 | Disposition: A | Discharge status: Home / Self Care | Payer: BC Managed Care – PPO | Source: Ambulatory Visit | Attending: Radiation Oncology | Admitting: Radiation Oncology

## 2019-06-13 DIAGNOSIS — C439 Malignant melanoma of skin, unspecified: Secondary | ICD-10-CM | POA: Diagnosis not present

## 2019-06-16 ENCOUNTER — Other Ambulatory Visit: Payer: Self-pay

## 2019-06-16 ENCOUNTER — Inpatient Hospital Stay: Payer: BC Managed Care – PPO

## 2019-06-16 ENCOUNTER — Ambulatory Visit
Admission: RE | Admit: 2019-06-16 | Discharge: 2019-06-16 | Disposition: A | Discharge status: Home / Self Care | Payer: BC Managed Care – PPO | Source: Ambulatory Visit | Attending: Radiation Oncology | Admitting: Radiation Oncology

## 2019-06-16 DIAGNOSIS — C439 Malignant melanoma of skin, unspecified: Secondary | ICD-10-CM | POA: Diagnosis not present

## 2019-06-16 DIAGNOSIS — C774 Secondary and unspecified malignant neoplasm of inguinal and lower limb lymph nodes: Secondary | ICD-10-CM | POA: Diagnosis not present

## 2019-06-16 LAB — CBC
HCT: 36 % — ABNORMAL LOW (ref 39.0–52.0)
Hemoglobin: 11.9 g/dL — ABNORMAL LOW (ref 13.0–17.0)
MCH: 29.2 pg (ref 26.0–34.0)
MCHC: 33.1 g/dL (ref 30.0–36.0)
MCV: 88.2 fL (ref 80.0–100.0)
Platelets: 176 10*3/uL (ref 150–400)
RBC: 4.08 MIL/uL — ABNORMAL LOW (ref 4.22–5.81)
RDW: 13.6 % (ref 11.5–15.5)
WBC: 4.1 10*3/uL (ref 4.0–10.5)
nRBC: 0 % (ref 0.0–0.2)

## 2019-06-17 ENCOUNTER — Ambulatory Visit
Admission: RE | Admit: 2019-06-17 | Discharge: 2019-06-17 | Disposition: A | Discharge status: Home / Self Care | Payer: BC Managed Care – PPO | Source: Ambulatory Visit | Attending: Radiation Oncology | Admitting: Radiation Oncology

## 2019-06-17 DIAGNOSIS — C439 Malignant melanoma of skin, unspecified: Secondary | ICD-10-CM | POA: Diagnosis not present

## 2019-06-18 ENCOUNTER — Encounter: Payer: Self-pay | Admitting: Physical Therapy

## 2019-06-18 ENCOUNTER — Ambulatory Visit
Admission: RE | Admit: 2019-06-18 | Discharge: 2019-06-18 | Disposition: A | Discharge status: Home / Self Care | Payer: BC Managed Care – PPO | Source: Ambulatory Visit | Attending: Radiation Oncology | Admitting: Radiation Oncology

## 2019-06-18 ENCOUNTER — Other Ambulatory Visit: Payer: Self-pay

## 2019-06-18 ENCOUNTER — Other Ambulatory Visit: Payer: Self-pay | Admitting: *Deleted

## 2019-06-18 ENCOUNTER — Ambulatory Visit: Payer: BC Managed Care – PPO | Attending: General Surgery | Admitting: Physical Therapy

## 2019-06-18 DIAGNOSIS — M6281 Muscle weakness (generalized): Secondary | ICD-10-CM

## 2019-06-18 DIAGNOSIS — C439 Malignant melanoma of skin, unspecified: Secondary | ICD-10-CM | POA: Diagnosis not present

## 2019-06-18 DIAGNOSIS — R262 Difficulty in walking, not elsewhere classified: Secondary | ICD-10-CM | POA: Insufficient documentation

## 2019-06-18 MED ORDER — SILVER SULFADIAZINE 1 % EX CREA: 1.0000 "application " | TOPICAL_CREAM | Freq: Two times a day (BID) | CUTANEOUS | 2 refills | Status: DC

## 2019-06-18 NOTE — Therapy (Signed)
[place] MAIN [facility] SERVICES [address] [place], [place], [address] Phone: [phone]   Fax:  [phone]  Physical Therapy Evaluation  Patient Details  Name: Edward Pitts MRN: [id number] Date of Birth: [birth year] Referring Provider (PT): [name]   Encounter Date: 06/18/2019  PT End of Session - 06/18/19 1230    Visit Number  1    Number of Visits  9    Date for PT Re-Evaluation  08/13/19    PT Start Time  1132    PT Stop Time  1230    PT Time Calculation (min)  58 min    Equipment Utilized During Treatment  Gait belt    Activity Tolerance  Patient tolerated treatment well    Behavior During Therapy  [name] for tasks assessed/performed       Past Medical History:  Diagnosis Date  . Anxiety   . Arthritis   . Complication of anesthesia   . Family history of adverse reaction to anesthesia    PONV mother  . Hyperlipidemia   . Hypertension   . Melanoma ([place]) 2012  . PONV (postoperative nausea and vomiting) 04/16/2019  . Presence of permanent cardiac pacemaker    [employer]  . Seizures ([place])     Past Surgical History:  Procedure Laterality Date  . KNEE SURGERY Left   . LEFT HEART CATH AND CORONARY ANGIOGRAPHY Left 06/29/2017   Procedure: LEFT HEART CATH AND CORONARY ANGIOGRAPHY;  Surgeon: [name], MD;  Location: [facility];  Service: Cardiovascular;  Laterality: Left;  . LYMPH NODE DISSECTION Left 04/16/2019   Procedure: Left inguinal Lymph Node Dissection;  Surgeon: [name], MD;  Location: [facility];  Service: General;  Laterality: Left;  Marland Kitchen MELANOMA EXCISION Left 04/16/2019   Procedure: MELANOMA EXCISION LEFT GROIN MASS;  Surgeon: [name], MD;  Location: [facility];  Service: General;  Laterality: Left;  Marland Kitchen MELANOMA EXCISION WITH SENTINEL LYMPH NODE BIOPSY Left 2012   Left calf   . PACEMAKER INSERTION N/A 08/26/2018   Procedure: INSERTION PACEMAKER;  Surgeon: [name], MD;  Location: [facility];  Service: Cardiovascular;  Laterality: N/A;  . TEMPORARY PACEMAKER N/A 08/25/2018   Procedure: TEMPORARY PACEMAKER;  Surgeon: [name], MD;  Location: [facility];  Service: Cardiovascular;  Laterality: N/A;    There were no vitals filed for this visit.   Subjective Assessment - 06/18/19 1124    Subjective  "I do have trouble getting up out of the chair. My left leg will swell sometimes especially if I stand for long period of time."    Pertinent History  59 yo Male diagnosed with Malignant Melanoma in left LE lymph nodes in November 2020. he underwent surgery for tumor removal and lymphectomy in January 2021. He did have a drain and since has had it removed. He reports increased swelling in LLE since surgery. He is currently getting radiation until July 07, 2019. He is actively being following by oncology; Since surgery he reports numbness/tingling in LLE especially along knee. He is currently walking without AD and feels like he is walking better than prior to tumor removal. He does have intermittent LLE knee pain from arthritis. He denies any buckling at this time; Since surgery patient did receive 6 weeks of home health PT which ended first of March. He has now been referred to outpatient PT for mobility deficits. PMH significant: HTN (somewhat controlled), HLD (controlled), history of seizures (last episode was 1  year ago), pacemaker placement June 2020, Malignant Melanoma of left lymph nodes in groin November 2020    Limitations  Standing;Walking    How long can you sit comfortably?  NA    How long can you stand comfortably?  20-30 min    How long can you walk comfortably?  at least 500 feet    Diagnostic tests  Pt was recently assessed for LLE blood clots- per patient report he tested negative;    Patient Stated Goals  "Make the leg better." Be able to get leg stronger and for it to not swell as much;    Currently in Pain?  No/denies         [facility] PT Assessment -  06/18/19 0001      Assessment   Medical Diagnosis  Malignant Melanoma    Referring Provider (PT)  [name]    Onset Date/Surgical Date  01/26/19    Hand Dominance  Right    Next MD Visit  6 months    Prior Therapy  Had acute care and home health PT following surgery, no outpatient PT for this condition      Precautions   Precautions  ICD/Pacemaker;Fall    Required Braces or Orthoses  --   ordered compression hose     Restrictions   Weight Bearing Restrictions  No      Balance Screen   Has the patient fallen in the past 6 months  No    Has the patient had a decrease in activity level because of a fear of falling?   No    Is the patient reluctant to leave their home because of a fear of falling?   No      Home Environment   Additional Comments  Lives in single story home, has 1-2 steps to enter home, has a handrail for safety; mod I for self care ADLs including bathing, dressing; Pt helps with cooking/cleaning;      Prior Function   Level of Independence  Independent    Vocation  Other (comment)   on medical leave   Vocation Requirements  Works at [employer]; stand 8 hours a day    Leisure  Watch sports      Cognition   Overall Cognitive Status  Within Functional Limits for tasks assessed      Observation/Other Assessments   Observations  does exhibit significant swelling in LLE throughout entire leg    Focus on Therapeutic Outcomes (FOTO)   50/100    Other Surveys   Activities of Balance and Confidence Scale    Activities of Balance Confidence Scale (ABC Scale)   58%      Sensation   Light Touch  Appears Intact   reports tingling along left knee but light touch intact   Proprioception  Appears Intact      Coordination   Gross Motor Movements are Fluid and Coordinated  Yes    Fine Motor Movements are Fluid and Coordinated  Yes   does have trouble with rapid alternating hand movement   Heel Shin Test  accurate bilaterally      Posture/Postural  Control   Posture Comments  WFL      AROM   Overall AROM Comments  BUE and BLE are [facility]      Strength   Right/Left Hip  Right;Left    Right Hip Flexion  5/5    Right Hip Extension  4/5    Left Hip Flexion  4+/5    Left Hip Extension  4/5    Right Knee Flexion  4/5    Right Knee Extension  5/5    Left Knee Flexion  4/5    Left Knee Extension  5/5    Right Ankle Dorsiflexion  5/5    Right Ankle Plantar Flexion  4/5    Left Ankle Dorsiflexion  5/5    Left Ankle Plantar Flexion  4/5      Ambulation/Gait   Gait Comments  ambulates with wide base of support, slight antalgic gait left with short stance time on LLE (patient denies any pain) does exhibit mild increase in extension in LLE during swing/stance with less knee flexion;       6 Minute Walk- Baseline   6 Minute Walk- Baseline  yes    BP (mmHg)  126/63    HR (bpm)  73    02 Sat (%RA)  98 %      6 Minute walk- Post Test   6 Minute Walk Post Test  yes    BP (mmHg)  127/69    HR (bpm)  104    02 Sat (%RA)  98 %      6 minute walk test results    Aerobic Endurance Distance Walked  1330    Endurance additional comments  without AD, community ambulator, less than age group norms of 1800 feet      Standardized Balance Assessment   Five times sit to stand comments   15.5 sec without HHA (slight risk for falls)    10 Meter Walk  1.2 m/s without AD                Objective measurements completed on examination: See above findings.              PT Education - 06/18/19 1224    Education Details  recommendations, plan of care    Person(s) Educated  Patient    Methods  Explanation    Comprehension  Verbalized understanding       PT Short Term Goals - 06/18/19 1229      PT SHORT TERM GOAL #1   Title  Patient will be adherent to HEP at least 3x a week to improve functional strength and balance for better safety at home.    Time  4    Period  Weeks    Status  New    Target Date  07/16/19      PT  SHORT TERM GOAL #2   Title  Patient (< 47 years old) will complete five times sit to stand test in < 10 seconds indicating an increased LE strength and improved balance.    Time  4    Period  Weeks    Status  New    Target Date  07/16/19        PT Long Term Goals - 06/18/19 1230      PT LONG TERM GOAL #1   Title  Patient will increase six minute walk test distance to >1500 for progression to community ambulator and improve gait ability closer to age group norms    Time  8    Period  Weeks    Status  New    Target Date  08/13/19      PT LONG TERM GOAL #2   Title  Patient will be require no assist with ascend/descend 3 steps without rail assist to improve safety to enter/exit home.  Time  8    Period  Weeks    Status  New    Target Date  08/13/19      PT LONG TERM GOAL #3   Title  Patient will improve FOTO score to >55/100 to exhibit improved functional mobility with ADLs including improved walking ability;    Time  8    Period  Weeks    Status  New    Target Date  08/13/19      PT LONG TERM GOAL #4   Title  Patient will increase BLE gross strength to 4+/5 as to improve functional strength for independent gait, increased standing tolerance and increased ADL ability.    Time  8    Period  Weeks    Status  New    Target Date  08/13/19             Plan - 06/18/19 1140    Clinical Impression Statement  59 yo Male s/p Malignant Melanoma in left groin s/p lymph node removal with LLE swelling/edema. Patient denies any pain currently. He does exhibit mild weakness in LLE. He is currently ambulating independently without AD with slight slower gait speed, wide base of support and slight antalgic gait pattern on LLE. He did test as a slight fall risk as evidenced by slower 5 times sit<>Stand. Patient would benefit from skilled PT Intervention to improve strength and mobility; Recommend referral to lymphedema specialist for LLE swelling management.    Personal Factors and  Comorbidities  Comorbidity 3+    Comorbidities  PMH significant: HTN (somewhat controlled), HLD (controlled), LLE knee OA,  history of seizures (last episode was 1 year ago), pacemaker placement June 2020, Malignant Melanoma of left lymph nodes in groin November 2020    Examination-Activity Limitations  Locomotion Level;Squat;Stairs;Stand;Transfers    Examination-Participation Restrictions  Cleaning;Community Activity;Shop;Volunteer;Yard Work    [employer]  Evolving/Moderate complexity    Clinical Decision Making  Moderate    Rehab Potential  Good    PT Frequency  1x / week    PT Duration  8 weeks    PT Treatment/Interventions  Cryotherapy;Moist Heat;Gait training;Stair training;Functional mobility training;Therapeutic activities;Therapeutic exercise;Balance training;Neuromuscular re-education;Patient/family education;Energy conservation    PT Next Visit Plan  initiate HEP for LE strengthening    PT Home Exercise Plan  will address next session;    Recommended Other Services  lymphedema    Consulted and Agree with Plan of Care  Patient       Patient will benefit from skilled therapeutic intervention in order to improve the following deficits and impairments:  Decreased endurance, Decreased mobility, Difficulty walking, Improper body mechanics, Increased edema, Decreased activity tolerance, Decreased strength  Visit Diagnosis: Muscle weakness (generalized)  Difficulty in walking, not elsewhere classified     Problem List Patient Active Problem List   Diagnosis Date Noted  . Malignant melanoma of overlapping sites ([place]) 04/23/2019  . Goals of care, counseling/discussion 04/23/2019  . 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 Near syncope 12/17/2018  . AKI (acute kidney injury) ([place]) 08/24/2018  . Acute hyperkalemia 08/24/2018  . HTN (hypertension) 08/24/2018  . HLD (hyperlipidemia) 08/24/2018  . Seizures ([place]) 08/24/2018  . Complete  heart block ([place]) 08/24/2018  . Stable angina ([place]) 06/27/2017    Rashelle Ireland  PT, DPT 06/18/2019, 2:44 PM  [place] MAIN [facility] SERVICES [address] [place], [place], [address] Phone: [phone]   Fax:  [phone]  Name: Kavaughn Karlberg  MRN: [id number] Date of Birth: 1960/04/06

## 2019-06-19 ENCOUNTER — Ambulatory Visit
Admission: RE | Admit: 2019-06-19 | Discharge: 2019-06-19 | Disposition: A | Discharge status: Home / Self Care | Payer: BC Managed Care – PPO | Source: Ambulatory Visit | Attending: Radiation Oncology | Admitting: Radiation Oncology

## 2019-06-19 DIAGNOSIS — C439 Malignant melanoma of skin, unspecified: Secondary | ICD-10-CM | POA: Diagnosis not present

## 2019-06-20 ENCOUNTER — Ambulatory Visit
Admission: RE | Admit: 2019-06-20 | Discharge: 2019-06-20 | Disposition: A | Discharge status: Home / Self Care | Payer: BC Managed Care – PPO | Source: Ambulatory Visit | Attending: Radiation Oncology | Admitting: Radiation Oncology

## 2019-06-20 DIAGNOSIS — C439 Malignant melanoma of skin, unspecified: Secondary | ICD-10-CM | POA: Diagnosis not present

## 2019-06-23 ENCOUNTER — Ambulatory Visit: Payer: BC Managed Care – PPO | Admitting: Physical Therapy

## 2019-06-23 ENCOUNTER — Inpatient Hospital Stay: Payer: BC Managed Care – PPO

## 2019-06-23 ENCOUNTER — Encounter: Payer: Self-pay | Admitting: Physical Therapy

## 2019-06-23 ENCOUNTER — Ambulatory Visit
Admission: RE | Admit: 2019-06-23 | Discharge: 2019-06-23 | Disposition: A | Discharge status: Home / Self Care | Payer: BC Managed Care – PPO | Source: Ambulatory Visit | Attending: Radiation Oncology | Admitting: Radiation Oncology

## 2019-06-23 ENCOUNTER — Other Ambulatory Visit: Payer: Self-pay

## 2019-06-23 DIAGNOSIS — C439 Malignant melanoma of skin, unspecified: Secondary | ICD-10-CM

## 2019-06-23 DIAGNOSIS — M6281 Muscle weakness (generalized): Secondary | ICD-10-CM | POA: Diagnosis not present

## 2019-06-23 DIAGNOSIS — C774 Secondary and unspecified malignant neoplasm of inguinal and lower limb lymph nodes: Secondary | ICD-10-CM | POA: Diagnosis not present

## 2019-06-23 DIAGNOSIS — C779 Secondary and unspecified malignant neoplasm of lymph node, unspecified: Secondary | ICD-10-CM

## 2019-06-23 DIAGNOSIS — R262 Difficulty in walking, not elsewhere classified: Secondary | ICD-10-CM

## 2019-06-23 LAB — CBC
HCT: 35.3 % — ABNORMAL LOW (ref 39.0–52.0)
Hemoglobin: 11.7 g/dL — ABNORMAL LOW (ref 13.0–17.0)
MCH: 29.1 pg (ref 26.0–34.0)
MCHC: 33.1 g/dL (ref 30.0–36.0)
MCV: 87.8 fL (ref 80.0–100.0)
Platelets: 243 10*3/uL (ref 150–400)
RBC: 4.02 MIL/uL — ABNORMAL LOW (ref 4.22–5.81)
RDW: 13.5 % (ref 11.5–15.5)
WBC: 4.6 10*3/uL (ref 4.0–10.5)
nRBC: 0 % (ref 0.0–0.2)

## 2019-06-23 NOTE — Therapy (Signed)
[place] MAIN [facility] SERVICES [address] [place], [place], [address] Phone: [phone]   Fax:  [phone]  Physical Therapy Treatment  Patient Details  Name: Edward Pitts MRN: [id number] Date of Birth: 1961-01-07 Referring Provider (PT): [name]   Encounter Date: 06/23/2019  PT End of Session - 06/23/19 1159    Visit Number  2    Number of Visits  9    Date for PT Re-Evaluation  08/13/19    PT Start Time  [id number]    PT Stop Time  1225    PT Time Calculation (min)  40 min    Equipment Utilized During Treatment  Gait belt    Activity Tolerance  Patient tolerated treatment well    Behavior During Therapy  [facility] for tasks assessed/performed       Past Medical History:  Diagnosis Date  . Anxiety   . Arthritis   . Complication of anesthesia   . Family history of adverse reaction to anesthesia    PONV mother  . Hyperlipidemia   . Hypertension   . Melanoma ([place]) 2012  . PONV (postoperative nausea and vomiting) 04/16/2019  . Presence of permanent cardiac pacemaker    Medtronic  . Seizures ([place])     Past Surgical History:  Procedure Laterality Date  . KNEE SURGERY Left   . LEFT HEART CATH AND CORONARY ANGIOGRAPHY Left 06/29/2017   Procedure: LEFT HEART CATH AND CORONARY ANGIOGRAPHY;  Surgeon: [name], MD;  Location: [facility];  Service: Cardiovascular;  Laterality: Left;  . LYMPH NODE DISSECTION Left 04/16/2019   Procedure: Left inguinal Lymph Node Dissection;  Surgeon: [name], MD;  Location: [facility];  Service: General;  Laterality: Left;  Marland Kitchen MELANOMA EXCISION Left 04/16/2019   Procedure: MELANOMA EXCISION LEFT GROIN MASS;  Surgeon: [name], MD;  Location: [facility];  Service: General;  Laterality: Left;  Marland Kitchen MELANOMA EXCISION WITH SENTINEL LYMPH NODE BIOPSY Left 2012   Left calf   . PACEMAKER INSERTION N/A 08/26/2018   Procedure: INSERTION PACEMAKER;  Surgeon: [name], MD;  Location: [facility];   Service: Cardiovascular;  Laterality: N/A;  . TEMPORARY PACEMAKER N/A 08/25/2018   Procedure: TEMPORARY PACEMAKER;  Surgeon: [name], MD;  Location: [facility];  Service: Cardiovascular;  Laterality: N/A;    There were no vitals filed for this visit.  Subjective Assessment - 06/23/19 1155    Subjective  Patient reports that he feels that he is doing ok today.    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 he underwent surgery for tumor removal and lymphectomy in January 2021. He did have a drain and since has had it removed. He reports increased swelling in LLE since surgery. He is currently getting radiation until July 07, 2019. He is actively being following by oncology; Since surgery he reports numbness/tingling in LLE especially along knee. He is currently walking without AD and feels like he is walking better than prior to tumor removal. He does have intermittent LLE knee pain from arthritis. He denies any buckling at this time; Since surgery patient did receive 6 weeks of home health PT which ended first of March. He has now been referred to outpatient PT for mobility deficits. 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  NA    How long can you stand comfortably?  20-30 min    How long can you walk comfortably?  at least 500 feet    Diagnostic tests  Pt was recently assessed for LLE blood clots- per patient report he tested negative;    Patient Stated Goals  "Make the leg better." Be able to get leg stronger and for it to not swell as much;    Currently in Pain?  Yes    Pain Score  5     Pain Location  Knee    Pain Orientation  Left    Pain Descriptors / Indicators  Aching    Pain  Type  Acute pain    Pain Radiating Towards  NA    Aggravating Factors   NA    Pain Relieving Factors  NA    Effect of Pain on Daily Activities  difficult to walk and do activities      Treatment Nu-step x 5 mins , 80 DPM TM x 3:44 minutes 2.7 miles/ hour, 8 mins at 2.5 miles /hour Lymphedema LE exercises performed and given a written hand out for HEP    Patient performed with instruction, verbal cues, tactile cues of therapist: goal: increase tissue extensibility, promote proper posture, improve mobility Patient pain has no pain during ambulation on TM.                        PT Education - 06/23/19 1157    Education Details  HEP    Person(s) Educated  Patient    Methods  Explanation;Demonstration    Comprehension  Verbalized understanding;Returned demonstration;Tactile cues required;Need further instruction       PT Short Term Goals - 06/18/19 1229      PT SHORT TERM GOAL #1   Title  Patient will be adherent to HEP at least 3x a week to improve functional strength and balance for better safety at home.    Time  4    Period  Weeks    Status  New    Target Date  07/16/19      PT SHORT TERM GOAL #2   Title  Patient (59 years old) will complete five times sit to stand test in < 10 seconds indicating an increased LE strength and improved balance.    Time  4    Period  Weeks    Status  New    Target Date  07/16/19        PT Long Term Goals - 06/18/19 1230      PT LONG TERM GOAL #1   Title  Patient will increase six minute walk test distance to >1500 for progression to community ambulator and improve gait ability closer to age group norms    Time  8    Period  Weeks    Status  New    Target Date  08/13/19      PT LONG TERM GOAL #2   Title  Patient will be require no assist with ascend/descend 3 steps without rail assist to improve safety to enter/exit home.    Time  8    Period  Weeks    Status  New    Target Date  08/13/19      PT LONG TERM  GOAL #3   Title  Patient will improve FOTO score to >  55/100 to exhibit improved functional mobility with ADLs including improved walking ability;    Time  8    Period  Weeks    Status  New    Target Date  08/13/19      PT LONG TERM GOAL #4   Title  Patient will increase BLE gross strength to 4+/5 as to improve functional strength for independent gait, increased standing tolerance and increased ADL ability.    Time  8    Period  Weeks    Status  New    Target Date  08/13/19            Plan - 06/23/19 1200    Clinical Impression Statement  Pt requires direction and verbal cues for correct performance of  strengthening exercises. Patient demonstrates weakness in BLE and performs open and closed chain exercises with no reports of increased pain. Pt was able to perform all exercises with min assist and VC for technique Pt encouraged continuing HEP .Follow-up as scheduled.   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 November 2020    Examination-Activity Limitations  Locomotion Level;Squat;Stairs;Stand;Transfers    Examination-Participation Restrictions  Cleaning;Community Activity;Shop;Volunteer;Yard Work    [employer]chant [employer] officer  Evolving/Moderate complexity    Rehab Potential  Good    PT Frequency  1x / week    PT Duration  8 weeks    PT Treatment/Interventions  Cryotherapy;Moist Heat;Gait training;Stair training;Functional mobility training;Therapeutic activities;Therapeutic exercise;Balance training;Neuromuscular re-education;Patient/family education;Energy conservation    PT Next Visit Plan  initiate HEP for LE strengthening    PT Home Exercise Plan  will address next session;    Consulted and Agree with Plan of Care  Patient        Patient will benefit from skilled therapeutic intervention in order to improve the following deficits and impairments:  Decreased endurance, Decreased mobility, Difficulty walking, Improper body mechanics, Increased edema, Decreased activity tolerance, Decreased strength  Visit Diagnosis: Muscle weakness (generalized)  Difficulty in walking, not elsewhere classified     Problem List Patient Active Problem List   Diagnosis Date Noted  . Malignant melanoma of overlapping sites ([place]) 04/23/2019  . Goals of care, counseling/discussion 04/23/2019  . 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 Near syncope 12/17/2018  . AKI (acute kidney injury) ([place]) 08/24/2018  . Acute hyperkalemia 08/24/2018  . HTN (hypertension) 08/24/2018  . HLD (hyperlipidemia) 08/24/2018  . Seizures ([place]) 08/24/2018  . Complete heart block ([place]) 08/24/2018  . Stable angina ([place]) 06/27/2017    Alanson Puls , [place] DPT 06/23/2019, 12:01 PM  [place] MAIN [facility] SERVICES [address] [place], [place], [address] Phone: [phone]   Fax:  [phone]  Name: Jacquese Curtain MRN: [id number] Date of Birth: May 30, 1960

## 2019-06-24 ENCOUNTER — Ambulatory Visit
Admission: RE | Admit: 2019-06-24 | Discharge: 2019-06-24 | Disposition: A | Discharge status: Home / Self Care | Payer: BC Managed Care – PPO | Source: Ambulatory Visit | Attending: Radiation Oncology | Admitting: Radiation Oncology

## 2019-06-24 DIAGNOSIS — C439 Malignant melanoma of skin, unspecified: Secondary | ICD-10-CM | POA: Diagnosis not present

## 2019-06-25 ENCOUNTER — Ambulatory Visit
Admission: RE | Admit: 2019-06-25 | Discharge: 2019-06-25 | Disposition: A | Discharge status: Home / Self Care | Payer: BC Managed Care – PPO | Source: Ambulatory Visit | Attending: Radiation Oncology | Admitting: Radiation Oncology

## 2019-06-25 DIAGNOSIS — C439 Malignant melanoma of skin, unspecified: Secondary | ICD-10-CM | POA: Diagnosis not present

## 2019-06-26 ENCOUNTER — Ambulatory Visit: Payer: BC Managed Care – PPO | Admitting: Physical Therapy

## 2019-06-26 ENCOUNTER — Ambulatory Visit
Admission: RE | Admit: 2019-06-26 | Discharge: 2019-06-26 | Disposition: A | Discharge status: Home / Self Care | Payer: BC Managed Care – PPO | Source: Ambulatory Visit | Attending: Radiation Oncology | Admitting: Radiation Oncology

## 2019-06-26 DIAGNOSIS — Z7982 Long term (current) use of aspirin: Secondary | ICD-10-CM | POA: Diagnosis not present

## 2019-06-26 DIAGNOSIS — Z803 Family history of malignant neoplasm of breast: Secondary | ICD-10-CM | POA: Insufficient documentation

## 2019-06-26 DIAGNOSIS — Z79899 Other long term (current) drug therapy: Secondary | ICD-10-CM | POA: Insufficient documentation

## 2019-06-26 DIAGNOSIS — F419 Anxiety disorder, unspecified: Secondary | ICD-10-CM | POA: Diagnosis not present

## 2019-06-26 DIAGNOSIS — C779 Secondary and unspecified malignant neoplasm of lymph node, unspecified: Secondary | ICD-10-CM | POA: Insufficient documentation

## 2019-06-26 DIAGNOSIS — C439 Malignant melanoma of skin, unspecified: Secondary | ICD-10-CM | POA: Insufficient documentation

## 2019-06-26 DIAGNOSIS — M129 Arthropathy, unspecified: Secondary | ICD-10-CM | POA: Diagnosis not present

## 2019-06-26 DIAGNOSIS — I10 Essential (primary) hypertension: Secondary | ICD-10-CM | POA: Insufficient documentation

## 2019-06-27 ENCOUNTER — Ambulatory Visit
Admission: RE | Admit: 2019-06-27 | Discharge: 2019-06-27 | Disposition: A | Discharge status: Home / Self Care | Payer: BC Managed Care – PPO | Source: Ambulatory Visit | Attending: Radiation Oncology | Admitting: Radiation Oncology

## 2019-06-27 DIAGNOSIS — C439 Malignant melanoma of skin, unspecified: Secondary | ICD-10-CM | POA: Diagnosis not present

## 2019-06-30 ENCOUNTER — Inpatient Hospital Stay: Payer: BC Managed Care – PPO | Attending: Oncology

## 2019-06-30 ENCOUNTER — Other Ambulatory Visit: Payer: Self-pay

## 2019-06-30 ENCOUNTER — Ambulatory Visit
Admission: RE | Admit: 2019-06-30 | Discharge: 2019-06-30 | Disposition: A | Discharge status: Home / Self Care | Payer: BC Managed Care – PPO | Source: Ambulatory Visit | Attending: Radiation Oncology | Admitting: Radiation Oncology

## 2019-06-30 DIAGNOSIS — C4372 Malignant melanoma of left lower limb, including hip: Secondary | ICD-10-CM | POA: Insufficient documentation

## 2019-06-30 DIAGNOSIS — Z7189 Other specified counseling: Secondary | ICD-10-CM | POA: Insufficient documentation

## 2019-06-30 DIAGNOSIS — C439 Malignant melanoma of skin, unspecified: Secondary | ICD-10-CM | POA: Diagnosis not present

## 2019-06-30 DIAGNOSIS — Z5112 Encounter for antineoplastic immunotherapy: Secondary | ICD-10-CM | POA: Diagnosis present

## 2019-06-30 DIAGNOSIS — C774 Secondary and unspecified malignant neoplasm of inguinal and lower limb lymph nodes: Secondary | ICD-10-CM | POA: Diagnosis present

## 2019-06-30 DIAGNOSIS — L598 Other specified disorders of the skin and subcutaneous tissue related to radiation: Secondary | ICD-10-CM | POA: Diagnosis not present

## 2019-06-30 DIAGNOSIS — K769 Liver disease, unspecified: Secondary | ICD-10-CM | POA: Insufficient documentation

## 2019-06-30 LAB — CBC
HCT: 34.3 % — ABNORMAL LOW (ref 39.0–52.0)
Hemoglobin: 11.4 g/dL — ABNORMAL LOW (ref 13.0–17.0)
MCH: 29.2 pg (ref 26.0–34.0)
MCHC: 33.2 g/dL (ref 30.0–36.0)
MCV: 87.7 fL (ref 80.0–100.0)
Platelets: 228 10*3/uL (ref 150–400)
RBC: 3.91 MIL/uL — ABNORMAL LOW (ref 4.22–5.81)
RDW: 13.7 % (ref 11.5–15.5)
WBC: 3.8 10*3/uL — ABNORMAL LOW (ref 4.0–10.5)
nRBC: 0 % (ref 0.0–0.2)

## 2019-07-01 ENCOUNTER — Encounter: Payer: Self-pay | Admitting: Physical Therapy

## 2019-07-01 ENCOUNTER — Ambulatory Visit: Payer: BC Managed Care – PPO | Attending: General Surgery | Admitting: Physical Therapy

## 2019-07-01 ENCOUNTER — Ambulatory Visit
Admission: RE | Admit: 2019-07-01 | Discharge: 2019-07-01 | Disposition: A | Discharge status: Home / Self Care | Payer: BC Managed Care – PPO | Source: Ambulatory Visit | Attending: Radiation Oncology | Admitting: Radiation Oncology

## 2019-07-01 DIAGNOSIS — R262 Difficulty in walking, not elsewhere classified: Secondary | ICD-10-CM | POA: Insufficient documentation

## 2019-07-01 DIAGNOSIS — I890 Lymphedema, not elsewhere classified: Secondary | ICD-10-CM | POA: Diagnosis present

## 2019-07-01 DIAGNOSIS — M6281 Muscle weakness (generalized): Secondary | ICD-10-CM | POA: Insufficient documentation

## 2019-07-01 DIAGNOSIS — C439 Malignant melanoma of skin, unspecified: Secondary | ICD-10-CM | POA: Diagnosis not present

## 2019-07-01 NOTE — Therapy (Signed)
[place] MAIN [facility] SERVICES [place], [place], [address] Phone: [phone]   Fax:  [phone]  Physical Therapy Treatment  Patient Details  Name: Edward Pitts MRN: [id number] Date of Birth: 23-Jan-1961 Referring Provider (PT): [name]   Encounter Date: 07/01/2019  PT End of Session - 07/01/19 1110    Visit Number  3    Number of Visits  9    Date for PT Re-Evaluation  08/13/19    PT Start Time  1102    PT Stop Time  1145    PT Time Calculation (min)  43 min    Equipment Utilized During Treatment  Gait belt    Activity Tolerance  Patient tolerated treatment well    Behavior During Therapy  [facility] for tasks assessed/performed       Past Medical History:  Diagnosis Date  . Anxiety   . Arthritis   . Complication of anesthesia   . Family history of adverse reaction to anesthesia    PONV mother  . Hyperlipidemia   . Hypertension   . Melanoma ([place]) 2012  . PONV (postoperative nausea and vomiting) 04/16/2019  . Presence of permanent cardiac pacemaker    Medtronic  . Seizures ([place])     Past Surgical History:  Procedure Laterality Date  . KNEE SURGERY Left   . LEFT HEART CATH AND CORONARY ANGIOGRAPHY Left 06/29/2017   Procedure: LEFT HEART CATH AND CORONARY ANGIOGRAPHY;  Surgeon: [name], MD;  Location: [facility];  Service: Cardiovascular;  Laterality: Left;  . LYMPH NODE DISSECTION Left 04/16/2019   Procedure: Left inguinal Lymph Node Dissection;  Surgeon: [name], MD;  Location: [facility];  Service: General;  Laterality: Left;  Marland Kitchen MELANOMA EXCISION Left 04/16/2019   Procedure: MELANOMA EXCISION LEFT GROIN MASS;  Surgeon: [name], MD;  Location: [facility];  Service: General;  Laterality: Left;  Marland Kitchen MELANOMA EXCISION WITH SENTINEL LYMPH NODE BIOPSY Left 2012   Left calf   . PACEMAKER INSERTION N/A 08/26/2018   Procedure: INSERTION PACEMAKER;  Surgeon: [name], MD;  Location: [facility];   Service: Cardiovascular;  Laterality: N/A;  . TEMPORARY PACEMAKER N/A 08/25/2018   Procedure: TEMPORARY PACEMAKER;  Surgeon: [name], MD;  Location: [facility];  Service: Cardiovascular;  Laterality: N/A;    There were no vitals filed for this visit.  Subjective Assessment - 07/01/19 1108    Subjective  Patient reports increased LLE anterior thigh pain; Reports doing HEP about every other day;    Pertinent History  59 yo Male diagnosed with Malignant Melanoma in left LE lymph nodes in November 2020. he underwent surgery for tumor removal and lymphectomy in January 2021. He did have a drain and since has had it removed. He reports increased swelling in LLE since surgery. He is currently getting radiation until July 07, 2019. He is actively being following by oncology; Since surgery he reports numbness/tingling in LLE especially along knee. He is currently walking without AD and feels like he is walking better than prior to tumor removal. He does have intermittent LLE knee pain from arthritis. He denies any buckling at this time; Since surgery patient did receive 6 weeks of home health PT which ended first of March. He has now been referred to outpatient PT for mobility deficits. PMH significant: HTN (somewhat controlled), HLD (controlled), history of seizures (last episode was 1 year ago), pacemaker placement June 2020, Malignant Melanoma of left lymph nodes  in groin November 2020    Limitations  Standing;Walking    How long can you sit comfortably?  NA    How long can you stand comfortably?  20-30 min    How long can you walk comfortably?  at least 500 feet    Diagnostic tests  Pt was recently assessed for LLE blood clots- per patient report he tested negative;    Patient Stated Goals  "Make the leg better." Be able to get leg stronger and for it to not swell as much;    Currently in Pain?  Yes    Pain Score  6     Pain Location  Leg    Pain Orientation  Left    Pain Descriptors  / Indicators  Tender    Pain Type  Acute pain    Pain Onset  1 to 4 weeks ago    Pain Frequency  Intermittent    Aggravating Factors   radiation    Pain Relieving Factors  nothing    Effect of Pain on Daily Activities  decreased activity tolerance;    Multiple Pain Sites  No           Treatment Warm up on Treadmill 2.5 mph with 2 HHA x5 min (unbilled);  Re-educated patient in LE lympedema exercise to help with swelling and mobility:  Supine: LLE only: Heel slides x20 reps with cues to keep foot on table for better heel slide mobility; Hooklying: LLE hip abduction/ER x20 reps;  Supine LLE hip abduction/adduction to midline heel slides x20 reps; Sitting: LLE LAQ x20 reps; Sitting BLE heel/toe raises x20 reps; Standing LLE hamstring curl x20 reps;  Patient required min-moderate verbal/tactile cues for correct exercise technique. Patient verbalized understanding;   Educated patient in diaphramatic breathing in sitting x2-3 min; Patient had significant difficulty isolating deep belly breathing. Provided written handout for better adherence. Educated patient in importance of doing these gentle exercises 2-3 times per day to improve muscle pumping to help reduce LE swelling. He verbalized understanding;                          PT Education - 07/01/19 1109    Education Details  LE strengthening/stretches, HEP    Person(s) Educated  Patient    Methods  Explanation;Verbal cues    Comprehension  Verbalized understanding;Returned demonstration;Verbal cues required;Need further instruction       PT Short Term Goals - 06/18/19 1229      PT SHORT TERM GOAL #1   Title  Patient will be adherent to HEP at least 3x a week to improve functional strength and balance for better safety at home.    Time  4    Period  Weeks    Status  New    Target Date  07/16/19      PT SHORT TERM GOAL #2   Title  Patient (< 61 years old) will complete five times sit to stand test  in < 10 seconds indicating an increased LE strength and improved balance.    Time  4    Period  Weeks    Status  New    Target Date  07/16/19        PT Long Term Goals - 06/18/19 1230      PT LONG TERM GOAL #1   Title  Patient will increase six minute walk test distance to >1500 for progression to community ambulator and improve gait ability closer  to age group norms    Time  8    Period  Weeks    Status  New    Target Date  08/13/19      PT LONG TERM GOAL #2   Title  Patient will be require no assist with ascend/descend 3 steps without rail assist to improve safety to enter/exit home.    Time  8    Period  Weeks    Status  New    Target Date  08/13/19      PT LONG TERM GOAL #3   Title  Patient will improve FOTO score to >55/100 to exhibit improved functional mobility with ADLs including improved walking ability;    Time  8    Period  Weeks    Status  New    Target Date  08/13/19      PT LONG TERM GOAL #4   Title  Patient will increase BLE gross strength to 4+/5 as to improve functional strength for independent gait, increased standing tolerance and increased ADL ability.    Time  8    Period  Weeks    Status  New    Target Date  08/13/19            Plan - 07/01/19 1154    Clinical Impression Statement  Patient motivated and participated well within session. Re-educated patient in lymphedema exercises for LE. He does require min VCs for proper positioning and to increase ROM for better flexibility. Patient did have difficulty with diaphramatic breathing. 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 November 2020    Examination-Activity Limitations   Locomotion Level;Squat;Stairs;Stand;Transfers    Examination-Participation Restrictions  Cleaning;Community Activity;Shop;Volunteer;Yard Work    [employer]chant [employer] officer  Evolving/Moderate complexity    Rehab Potential  Good    PT Frequency  1x / week    PT Duration  8 weeks    PT Treatment/Interventions  Cryotherapy;Moist Heat;Gait training;Stair training;Functional mobility training;Therapeutic activities;Therapeutic exercise;Balance training;Neuromuscular re-education;Patient/family education;Energy conservation    PT Next Visit Plan  initiate HEP for LE strengthening    PT Home Exercise Plan  will address next session;    Consulted and Agree with Plan of Care  Patient       Patient will benefit from skilled therapeutic intervention in order to improve the following deficits and impairments:  Decreased endurance, Decreased mobility, Difficulty walking, Improper body mechanics, Increased edema, Decreased activity tolerance, Decreased strength  Visit Diagnosis: Muscle weakness (generalized)  Difficulty in walking, not elsewhere classified     Problem List Patient Active Problem List   Diagnosis Date Noted  . Malignant melanoma of overlapping sites ([place]) 04/23/2019  . Goals of care, counseling/discussion 04/23/2019  . 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 Near syncope 12/17/2018  . AKI (acute kidney injury) ([place]) 08/24/2018  . Acute hyperkalemia 08/24/2018  . HTN (hypertension) 08/24/2018  . HLD (hyperlipidemia) 08/24/2018  . Seizures ([place]) 08/24/2018  . Complete heart block ([place]) 08/24/2018  . Stable angina ([place]) 06/27/2017    Lourdez Mcgahan PT,DPT 07/01/2019, 12:02 PM  [place] MAIN [facility] SERVICES [address] [place], [place], [address] Phone: [phone]  Fax:  [phone]  Name: Edward Pitts MRN: [id number] Date of Birth: [birth year]

## 2019-07-02 ENCOUNTER — Ambulatory Visit
Admission: RE | Admit: 2019-07-02 | Discharge: 2019-07-02 | Disposition: A | Discharge status: Home / Self Care | Payer: BC Managed Care – PPO | Source: Ambulatory Visit | Attending: Radiation Oncology | Admitting: Radiation Oncology

## 2019-07-02 DIAGNOSIS — C439 Malignant melanoma of skin, unspecified: Secondary | ICD-10-CM | POA: Diagnosis not present

## 2019-07-03 ENCOUNTER — Ambulatory Visit
Admission: RE | Admit: 2019-07-03 | Discharge: 2019-07-03 | Disposition: A | Discharge status: Home / Self Care | Payer: BC Managed Care – PPO | Source: Ambulatory Visit | Attending: Radiation Oncology | Admitting: Radiation Oncology

## 2019-07-03 ENCOUNTER — Encounter: Payer: BC Managed Care – PPO | Admitting: Physical Therapy

## 2019-07-03 DIAGNOSIS — C439 Malignant melanoma of skin, unspecified: Secondary | ICD-10-CM | POA: Diagnosis not present

## 2019-07-04 ENCOUNTER — Ambulatory Visit
Admission: RE | Admit: 2019-07-04 | Discharge: 2019-07-04 | Disposition: A | Discharge status: Home / Self Care | Payer: BC Managed Care – PPO | Source: Ambulatory Visit | Attending: Radiation Oncology | Admitting: Radiation Oncology

## 2019-07-04 DIAGNOSIS — C439 Malignant melanoma of skin, unspecified: Secondary | ICD-10-CM | POA: Diagnosis not present

## 2019-07-07 ENCOUNTER — Ambulatory Visit
Admission: RE | Admit: 2019-07-07 | Discharge: 2019-07-07 | Disposition: A | Discharge status: Home / Self Care | Payer: BC Managed Care – PPO | Source: Ambulatory Visit | Attending: Radiation Oncology | Admitting: Radiation Oncology

## 2019-07-07 DIAGNOSIS — C439 Malignant melanoma of skin, unspecified: Secondary | ICD-10-CM | POA: Diagnosis not present

## 2019-07-08 ENCOUNTER — Ambulatory Visit: Payer: BC Managed Care – PPO | Admitting: Physical Therapy

## 2019-07-08 ENCOUNTER — Other Ambulatory Visit: Payer: Self-pay

## 2019-07-08 ENCOUNTER — Encounter: Payer: Self-pay | Admitting: Physical Therapy

## 2019-07-08 DIAGNOSIS — R262 Difficulty in walking, not elsewhere classified: Secondary | ICD-10-CM

## 2019-07-08 DIAGNOSIS — M6281 Muscle weakness (generalized): Secondary | ICD-10-CM | POA: Diagnosis not present

## 2019-07-08 NOTE — Therapy (Signed)
[place] MAIN [facility] SERVICES [address] [place], [place], [address] Phone: [phone]   Fax:  [phone]  Physical Therapy Treatment  Patient Details  Name: Edward Pitts MRN: [id number] Date of Birth: [birth year] Referring Provider (PT): [name]   Encounter Date: 07/08/2019  PT End of Session - 07/08/19 1337    Visit Number  4    Number of Visits  9    Date for PT Re-Evaluation  08/13/19    PT Start Time  0133    PT Stop Time  0215    PT Time Calculation (min)  42 min    Equipment Utilized During Treatment  Gait belt    Activity Tolerance  Patient tolerated treatment well    Behavior During Therapy  [facility] for tasks assessed/performed       Past Medical History:  Diagnosis Date  . Anxiety   . Arthritis   . Complication of anesthesia   . Family history of adverse reaction to anesthesia    PONV mother  . Hyperlipidemia   . Hypertension   . Melanoma ([place]) 2012  . PONV (postoperative nausea and vomiting) 04/16/2019  . Presence of permanent cardiac pacemaker    Medtronic  . Seizures ([place])     Past Surgical History:  Procedure Laterality Date  . KNEE SURGERY Left   . LEFT HEART CATH AND CORONARY ANGIOGRAPHY Left 06/29/2017   Procedure: LEFT HEART CATH AND CORONARY ANGIOGRAPHY;  Surgeon: [name], MD;  Location: [place] CV [facility];  Service: Cardiovascular;  Laterality: Left;  . LYMPH NODE DISSECTION Left 04/16/2019   Procedure: Left inguinal Lymph Node Dissection;  Surgeon: [name], MD;  Location: [place];  Service: General;  Laterality: Left;  Marland Kitchen MELANOMA EXCISION Left 04/16/2019   Procedure: MELANOMA EXCISION LEFT GROIN MASS;  Surgeon: [name], MD;  Location: [place];  Service: General;  Laterality: Left;  Marland Kitchen MELANOMA EXCISION WITH SENTINEL LYMPH NODE BIOPSY Left 2012   Left calf   . PACEMAKER INSERTION N/A 08/26/2018   Procedure: INSERTION PACEMAKER;  Surgeon: [name], MD;  Location: [facility] OR[facility];   Service: Cardiovascular;  Laterality: N/A;  . TEMPORARY PACEMAKER N/A 08/25/2018   Procedure: TEMPORARY PACEMAKER;  Surgeon: [name], MD;  Location: [place] [facility];  Service: Cardiovascular;  Laterality: N/A;    There were no vitals filed for this visit.  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 he underwent surgery for tumor removal and lymphectomy in January 2021. He did have a drain and since has had it removed. He reports increased swelling in LLE since surgery. He is currently getting radiation until July 07, 2019. He is actively being following by oncology; Since surgery he reports numbness/tingling in LLE especially along knee. He is currently walking without AD and feels like he is walking better than prior to tumor removal. He does have intermittent LLE knee pain from arthritis. He denies any buckling at this time; Since surgery patient did receive 6 weeks of home health PT which ended first of March. He has now been referred to outpatient PT for mobility deficits. 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  NA    How long can you stand comfortably?  20-30 min    How long can you walk comfortably?  at least 500 feet    Diagnostic tests  Pt was recently assessed for LLE blood clots- per patient report he tested negative;    Patient Stated Goals  "Make the leg better." Be able to get leg stronger and for it to not swell as much;    Currently in Pain?  Yes    Pain Score  6     Pain Location  Knee    Pain Orientation  Left    Pain Descriptors / Indicators   Aching    Pain Type  Chronic pain    Pain Onset  1 to 4 weeks ago    Pain Frequency  Constant    Aggravating Factors   walking    Pain Relieving Factors  nothing    Effect of Pain on Daily Activities  decreased activity level    Multiple Pain Sites  No       Ther-ex  TM at 2.3 miles / hour   x 10 mins  Hip flexion marches with 2# ankle weights x 10 bilateral Hip abduction with 2# x 10 bilateral Hip extension with 2# x 10 bilateral Quantum leg press 40 # x 20 x 3 sets  Sit to stand without UE support from regular height chair x 10   Squats x 15 with cues for correct posture Lunges to BOSU ball x 15 BLE Heel raises x 15 x 2 sets, 3 lbs  Step ups to 6-inch stool x 20  1/2 foam ankle PF/DF with finger tip hold x 20 Tandem on 1/2 foam flat side up x 2 mins  Eccentric step downs from 3 inch stool x 10 verbal cues to complete slow and tap heel.  BUE CGA Patient needs occasional verbal cueing to improve posture and cueing to correctly perform exercises slowly, holding at end of range to increase motor firing of desired muscle to encourage fatigue.                         PT Education - 07/08/19 1337    Education Details  HEP    Person(s) Educated  Patient    Methods  Explanation;Demonstration;Tactile cues       PT Short Term Goals - 06/18/19 1229      PT SHORT TERM GOAL #1   Title  Patient will be adherent to HEP at least 3x a week to improve functional strength and balance for better safety at home.    Time  4    Period  Weeks    Status  New    Target Date  07/16/19      PT SHORT TERM GOAL #2   Title  Patient (< 17 years old) will complete five times sit to stand test in < 10 seconds indicating an increased LE strength and improved balance.    Time  4    Period  Weeks    Status  New    Target Date  07/16/19        PT Long Term Goals - 06/18/19 1230      PT LONG TERM GOAL #1   Title  Patient will increase six minute walk test distance to >1500 for  progression to community ambulator and improve  gait ability closer to age group norms    Time  8    Period  Weeks    Status  New    Target Date  08/13/19      PT LONG TERM GOAL #2   Title  Patient will be require no assist with ascend/descend 3 steps without rail assist to improve safety to enter/exit home.    Time  8    Period  Weeks    Status  New    Target Date  08/13/19      PT LONG TERM GOAL #3   Title  Patient will improve FOTO score to >55/100 to exhibit improved functional mobility with ADLs including improved walking ability;    Time  8    Period  Weeks    Status  New    Target Date  08/13/19      PT LONG TERM GOAL #4   Title  Patient will increase BLE gross strength to 4+/5 as to improve functional strength for independent gait, increased standing tolerance and increased ADL ability.    Time  8    Period  Weeks    Status  New    Target Date  08/13/19            Plan - 07/08/19 1338    Clinical Impression Statement   Pt was able to perform all exercises today with CGA.Marland Kitchen Pt was able to perform all balance and strength exercises, demonstrating improvements in LE strength and stability.   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 November 2020    Examination-Activity Limitations  Locomotion Level;Squat;Stairs;Stand;Transfers    Examination-Participation Restrictions  Cleaning;Community Activity;Shop;Volunteer;Yard Work    Copy  Evolving/Moderate complexity    Rehab Potential  Good    PT Frequency  1x / week    PT Duration  8 weeks    PT Treatment/Interventions  Cryotherapy;Moist Heat;Gait training;Stair training;Functional mobility training;Therapeutic activities;Therapeutic exercise;Balance training;Neuromuscular re-education;Patient/family education;Energy conservation    PT Next Visit Plan  initiate HEP for LE strengthening    PT Home Exercise Plan  will address next session;    Consulted and Agree with Plan of Care  Patient       Patient will benefit from skilled therapeutic intervention in order to improve the following deficits and impairments:  Decreased endurance, Decreased mobility, Difficulty walking, Improper body mechanics, Increased edema, Decreased activity tolerance, Decreased strength  Visit Diagnosis: Muscle weakness (generalized)  Difficulty in walking, not elsewhere classified     Problem List Patient Active Problem List   Diagnosis Date Noted  . Malignant melanoma of overlapping sites ([place]) 04/23/2019  . Goals of care, counseling/discussion 04/23/2019  . 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 Near syncope 12/17/2018  . AKI (acute kidney injury) ([place]) 08/24/2018  . Acute hyperkalemia 08/24/2018  . HTN (hypertension) 08/24/2018  . HLD (hyperlipidemia) 08/24/2018  . Seizures ([place]) 08/24/2018  . Complete heart block ([place]) 08/24/2018  .  Stable angina ([place]) 06/27/2017    Alanson Puls, [place] DPT 07/08/2019, 1:39 PM  [place] MAIN [facility] SERVICES [address] [place], [place], [address] Phone: [phone]   Fax:  [phone]  Name: Edward Pitts MRN: [id number] Date of Birth: 1960-04-08

## 2019-07-10 ENCOUNTER — Encounter: Payer: BC Managed Care – PPO | Admitting: Physical Therapy

## 2019-07-14 ENCOUNTER — Other Ambulatory Visit: Payer: Self-pay

## 2019-07-14 ENCOUNTER — Inpatient Hospital Stay (HOSPITAL_BASED_OUTPATIENT_CLINIC_OR_DEPARTMENT_OTHER): Payer: BC Managed Care – PPO | Admitting: Oncology

## 2019-07-14 ENCOUNTER — Encounter: Payer: Self-pay | Admitting: Oncology

## 2019-07-14 ENCOUNTER — Inpatient Hospital Stay: Payer: BC Managed Care – PPO

## 2019-07-14 ENCOUNTER — Telehealth: Payer: Self-pay

## 2019-07-14 VITALS — BP 127/61 | HR 64 | Temp 97.8°F | Resp 18 | Wt 227.9 lb

## 2019-07-14 DIAGNOSIS — C438 Malignant melanoma of overlapping sites of skin: Secondary | ICD-10-CM | POA: Diagnosis not present

## 2019-07-14 DIAGNOSIS — Z7189 Other specified counseling: Secondary | ICD-10-CM

## 2019-07-14 DIAGNOSIS — K769 Liver disease, unspecified: Secondary | ICD-10-CM | POA: Diagnosis not present

## 2019-07-14 DIAGNOSIS — Z5112 Encounter for antineoplastic immunotherapy: Secondary | ICD-10-CM | POA: Diagnosis not present

## 2019-07-14 LAB — CBC WITH DIFFERENTIAL/PLATELET
Abs Immature Granulocytes: 0.06 10*3/uL (ref 0.00–0.07)
Basophils Absolute: 0 10*3/uL (ref 0.0–0.1)
Basophils Relative: 1 %
Eosinophils Absolute: 0.1 10*3/uL (ref 0.0–0.5)
Eosinophils Relative: 3 %
HCT: 34.5 % — ABNORMAL LOW (ref 39.0–52.0)
Hemoglobin: 11.5 g/dL — ABNORMAL LOW (ref 13.0–17.0)
Immature Granulocytes: 1 %
Lymphocytes Relative: 12 %
Lymphs Abs: 0.5 10*3/uL — ABNORMAL LOW (ref 0.7–4.0)
MCH: 28.9 pg (ref 26.0–34.0)
MCHC: 33.3 g/dL (ref 30.0–36.0)
MCV: 86.7 fL (ref 80.0–100.0)
Monocytes Absolute: 0.5 10*3/uL (ref 0.1–1.0)
Monocytes Relative: 11 %
Neutro Abs: 3 10*3/uL (ref 1.7–7.7)
Neutrophils Relative %: 72 %
Platelets: 203 10*3/uL (ref 150–400)
RBC: 3.98 MIL/uL — ABNORMAL LOW (ref 4.22–5.81)
RDW: 13.2 % (ref 11.5–15.5)
WBC: 4.2 10*3/uL (ref 4.0–10.5)
nRBC: 0 % (ref 0.0–0.2)

## 2019-07-14 LAB — COMPREHENSIVE METABOLIC PANEL
ALT: 31 U/L (ref 0–44)
AST: 24 U/L (ref 15–41)
Albumin: 4.2 g/dL (ref 3.5–5.0)
Alkaline Phosphatase: 95 U/L (ref 38–126)
Anion gap: 10 (ref 5–15)
BUN: 20 mg/dL (ref 6–20)
CO2: 25 mmol/L (ref 22–32)
Calcium: 9.2 mg/dL (ref 8.9–10.3)
Chloride: 104 mmol/L (ref 98–111)
Creatinine, Ser: 1.07 mg/dL (ref 0.61–1.24)
GFR calc Af Amer: >60 mL/min (ref >60)
GFR calc non Af Amer: >60 mL/min (ref >60)
Glucose, Bld: 98 mg/dL (ref 70–99)
Potassium: 4.3 mmol/L (ref 3.5–5.1)
Sodium: 139 mmol/L (ref 135–145)
Total Bilirubin: 0.5 mg/dL (ref 0.3–1.2)
Total Protein: 7.4 g/dL (ref 6.5–8.1)

## 2019-07-14 NOTE — Progress Notes (Signed)
Hematology/Oncology follow up  note [facility] [phone] [phone]   Patient Care Team: [name], [name], MD as PCP - General (Student)  REFERRING PROVIDER: Cherylann Parr, MD  CHIEF COMPLAINTS/REASON FOR VISIT:  Follow up for melanoma HISTORY OF PRESENTING ILLNESS:   Edward Pitts is a  59 y.o.  male with PMH listed below was seen in consultation at the request of  [name], [name], MD  for evaluation of inguinal mass Patient presented to emergency room 3 days ago complaining about left inguinal mass discomfort. Reports that he has really noticed the mass growing for the past 1 months. He has a history of left lower extremity melanoma in 2011, status post local excision.  Pain was increased with squatting of laxation. He was advised to take Tylenol for pain. Denies any fever, chills, night sweating.  He does feel mild nauseated. Appetite is fair.  He has lost about 10 pounds since earlier this year. 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  Left inguinal and left iliac nodes which are suspicious for involvement.  There are also 2 small nonspecific hypodense lesions within the right liver, nonspecific.  # patient underwent left groin mass resection On 04/16/2019. Resection pathology showed malignant melanoma, replacing a lymph node, with extracapsular extension, peripheral and deep margins involved.  Left inguinal contents, all 7 lymph nodes were negative for melanoma in the lymph nodes.  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  INTERVAL HISTORY Edward Pitts is a 59 y.o. male who has above history reviewed by me today presents for follow up visit for management of newly diagnosed inguinal nodal recurrence of melanoma Problems and complaints are listed below: patient underwent left groin mass resection On  04/16/2019.  Status post adjuvant radiation and finished radiation on 07/07/2019 Patient reports that he has been on Laurie since surgery. Reports some skin irritation, redness around the area of surgery and radiation. He denies any fever, chills, wound discharge, cough, shortness of breath, chest pain, abdominal pain.  Review of Systems  Constitutional: Negative for appetite change, chills, fatigue, fever and unexpected weight change.  HENT:   Negative for hearing loss and voice change.   Eyes: Negative for eye problems and icterus.  Respiratory: Negative for chest tightness, cough and shortness of breath.   Cardiovascular: Negative for chest pain and leg swelling.  Gastrointestinal: Negative for abdominal distention and abdominal pain.  Endocrine: Negative for hot flashes.  Genitourinary: Negative for difficulty urinating, dysuria and frequency.   Musculoskeletal: Negative for arthralgias.  Skin: Negative for itching and rash.       Status post left groin mass resection.  Neurological: Negative for light-headedness and numbness.  Hematological: Negative for adenopathy. Does not bruise/bleed easily.  Psychiatric/Behavioral: Negative for confusion.    MEDICAL HISTORY:  Past Medical History:  Diagnosis Date  . Anxiety   . Arthritis   . Complication of anesthesia   . Family history of adverse reaction to anesthesia    PONV mother  . Hyperlipidemia   . Hypertension   . Melanoma ([place]) 2012  . PONV (postoperative nausea and vomiting) 04/16/2019  . Presence of permanent cardiac pacemaker    Medtronic  . Seizures ([place])     SURGICAL HISTORY: Past Surgical History:  Procedure Laterality Date  . KNEE SURGERY Left   . LEFT HEART CATH AND CORONARY ANGIOGRAPHY Left 06/29/2017  Procedure: LEFT HEART CATH AND CORONARY ANGIOGRAPHY;  Surgeon: [name], MD;  Location: [place] [facility];  Service: Cardiovascular;  Laterality: Left;  . LYMPH NODE DISSECTION Left 04/16/2019    Procedure: Left inguinal Lymph Node Dissection;  Surgeon: [name], MD;  Location: [place];  Service: General;  Laterality: Left;  Marland Kitchen MELANOMA EXCISION Left 04/16/2019   Procedure: MELANOMA EXCISION LEFT GROIN MASS;  Surgeon: [name], MD;  Location: [place];  Service: General;  Laterality: Left;  Marland Kitchen MELANOMA EXCISION WITH SENTINEL LYMPH NODE BIOPSY Left 2012   Left calf   . PACEMAKER INSERTION N/A 08/26/2018   Procedure: INSERTION PACEMAKER;  Surgeon: [name], MD;  Location: [facility];  Service: Cardiovascular;  Laterality: N/A;  . TEMPORARY PACEMAKER N/A 08/25/2018   Procedure: TEMPORARY PACEMAKER;  Surgeon: [name], MD;  Location: [place] [facility];  Service: Cardiovascular;  Laterality: N/A;    SOCIAL HISTORY: Social History   Socioeconomic History  . Marital status: Married    Spouse name: Vicente Males   . Number of children: 7  . Years of education: 42  . Highest education level: Not on file  Occupational History  . Occupation: [employer]   Tobacco Use  . Smoking status: Never Smoker  . Smokeless tobacco: Never Used  Substance and Sexual Activity  . Alcohol use: No  . Drug use: No  . Sexual activity: Not on file  Other Topics Concern  . Not on file  Social History Narrative   Lives with mother, wife and sister   Caffeine use: sodas (2 per day)   Social Determinants of Health   Financial Resource Strain: Low Risk   . Difficulty of Paying Living Expenses: Not hard at all  Food Insecurity: No Food Insecurity  . Worried About Charity fundraiser in the Last Year: Never true  . Ran Out of Food in the Last Year: Never true  Transportation Needs: Unmet Transportation Needs  . Lack of Transportation (Medical): Yes  . Lack of Transportation (Non-Medical): Yes  Physical Activity: Unknown  . Days of Exercise per Week: 0 days  . Minutes of Exercise per Session: Not on file  Stress: No Stress Concern Present  . Feeling of Stress : Only a little  Social  Connections: Unknown  . Frequency of Communication with Friends and Family: More than three times a week  . Frequency of Social Gatherings with Friends and Family: Not on file  . Attends Religious Services: Not on file  . Active Member of Clubs or Organizations: Not on file  . Attends Archivist Meetings: Not on file  . Marital Status: Married  Human resources officer Violence: Not At Risk  . Fear of Current or Ex-Partner: No  . Emotionally Abused: No  . Physically Abused: No  . Sexually Abused: No    FAMILY HISTORY: Family History  Problem Relation Age of Onset  . Cancer Paternal Grandmother     ALLERGIES:  is allergic to ibuprofen.  MEDICATIONS:  Current Outpatient Medications  Medication Sig Dispense Refill  . acetaminophen (TYLENOL) 650 MG CR tablet Take 650 mg by mouth See admin instructions. Take 1 tablet (650 mg) by mouth scheduled for arthritis pain, may take an additional dose if needed for pain.    Marland Kitchen allopurinol (ZYLOPRIM) 300 MG tablet Take 300 mg by mouth daily.    Marland Kitchen aspirin 81 MG chewable tablet Chew 81 mg by mouth daily.    Marland Kitchen atorvastatin (LIPITOR) 20 MG tablet Take 20  mg by mouth daily.    . Cefdinir (OMNICEF PO) Take by mouth.    . clonazePAM (KLONOPIN) 0.5 MG tablet 1 tablet in the morning, 2 in the evening (Patient taking differently: Take 0.5-1 mg by mouth 2 (two) times daily as needed (anxiety). ) 90 tablet 3  . hydrocortisone 2.5 % cream Apply 1 application topically daily as needed (for itching).     Marland Kitchen ketoconazole (NIZORAL) 2 % cream Apply 1 application topically daily as needed for irritation.     Marland Kitchen lisinopril (ZESTRIL) 20 MG tablet Take 20 mg by mouth daily.    . metoprolol succinate (TOPROL-XL) 25 MG 24 hr tablet Take 25 mg by mouth daily.     . Multiple Vitamin (MULTIVITAMIN WITH MINERALS) TABS tablet Take 1 tablet by mouth daily. Centrum Silver    . silver sulfADIAZINE (SILVADENE) 1 % cream Apply 1 application topically 2 (two) times daily. 50 g 2    . lidocaine (LIDODERM) 5 % Place 1 patch onto the skin every 12 (twelve) hours. Remove & Discard patch within 12 hours or as directed by MD (Patient not taking: Reported on 07/14/2019) 10 patch 0  . ondansetron (ZOFRAN) 8 MG tablet Take 1 tablet (8 mg total) by mouth every 8 (eight) hours as needed for nausea, vomiting or refractory nausea / vomiting. (Patient not taking: Reported on 07/14/2019) 30 tablet 0  . traMADol (ULTRAM) 50 MG tablet Take 1 tablet (50 mg total) by mouth every 6 (six) hours as needed. (Patient not taking: Reported on 06/18/2019) 30 tablet 0   No current facility-administered medications for this visit.     PHYSICAL EXAMINATION: ECOG PERFORMANCE STATUS: 1 - Symptomatic but completely ambulatory Vitals:   07/14/19 1005  BP: 127/61  Pulse: 64  Resp: 18  Temp: 97.8 F (36.6 C)   Filed Weights   07/14/19 1005  Weight: 227 lb 14.4 oz (103.4 kg)    Physical Exam Constitutional:      General: He is not in acute distress. HENT:     Head: Normocephalic and atraumatic.  Eyes:     General: No scleral icterus.    Pupils: Pupils are equal, round, and reactive to light.  Cardiovascular:     Rate and Rhythm: Normal rate and regular rhythm.     Heart sounds: Normal heart sounds.  Pulmonary:     Effort: Pulmonary effort is normal. No respiratory distress.     Breath sounds: No wheezing.  Abdominal:     General: Bowel sounds are normal. There is no distension.     Palpations: Abdomen is soft. There is no mass.     Tenderness: There is no abdominal tenderness.     Comments: Status post left groin mass resection and status post radiation. Erythematous areas at the surgical sites  Musculoskeletal:        General: No deformity. Normal range of motion.     Cervical back: Normal range of motion and neck supple.  Skin:    General: Skin is warm and dry.     Findings: No erythema or rash.  Neurological:     Mental Status: He is alert and oriented to person, place, and  time. Mental status is at baseline.     Cranial Nerves: No cranial nerve deficit.     Coordination: Coordination normal.  Psychiatric:        Mood and Affect: Mood normal.    LABORATORY DATA:  I have reviewed the data as listed Lab Results  Component  Value Date   WBC 4.2 07/14/2019   HGB 11.5 (L) 07/14/2019   HCT 34.5 (L) 07/14/2019   MCV 86.7 07/14/2019   PLT 203 07/14/2019   Recent Labs    04/14/19 0949 04/14/19 0949 04/16/19 1917 04/17/19 0720 07/14/19 0947  NA 139  --   --  137 139  K 4.7  --   --  4.6 4.3  CL 104  --   --  102 104  CO2 24  --   --  23 25  GLUCOSE 100*  --   --  186* 98  BUN 18  --   --  25* 20  CREATININE 1.13   < > 1.73* 1.39* 1.07  CALCIUM 9.5  --   --  8.8* 9.2  GFRNONAA >60   < > 42* 55* >60  GFRAA >60   < > 49* >60 >60  PROT 7.0  --   --  6.6 7.4  ALBUMIN 3.7  --   --  3.3* 4.2  AST 25  --   --  27 24  ALT 30  --   --  24 31  ALKPHOS 83  --   --  79 95  BILITOT 0.6  --   --  0.2* 0.5   < > = values in this interval not displayed.   Iron/TIBC/Ferritin/ %Sat No results found for: IRON, TIBC, FERRITIN, IRONPCTSAT    RADIOGRAPHIC STUDIES: I have personally reviewed the radiological images as listed and agreed with the findings in the report. US Venous Img Lower Bilateral (DVT)  Result Date: 06/09/2019 CLINICAL DATA:  Leg swelling for 1 month, history of left inguinal node dissection EXAM: Bilateral LOWER EXTREMITY VENOUS DOPPLER ULTRASOUND TECHNIQUE: Gray-scale sonography with compression, as well as color and duplex ultrasound, were performed to evaluate the deep venous system(s) from the level of the common femoral vein through the popliteal and proximal calf veins. COMPARISON:  01/31/2019 FINDINGS: VENOUS Normal compressibility of the common femoral, superficial femoral, and popliteal veins, as well as the visualized calf veins. Visualized portions of profunda femoral vein and great saphenous vein unremarkable. No filling defects to suggest  DVT on grayscale or color Doppler imaging. Doppler waveforms show normal direction of venous flow, normal respiratory phasicity and response to augmentation. OTHER Heterogenous elongated hypoechoic area with internal fluid at the left groin, poorly measurable. Complex cyst and solid mass measuring 3.2 x 1 point 9 x 2.7 cm. Limitations: none IMPRESSION: No femoropopliteal DVT nor evidence of DVT within the visualized calf veins. If clinical symptoms are inconsistent or if there are persistent or worsening symptoms, further imaging (possibly involving the iliac veins) may be warranted. Heterogenous hypoechoic area with internal fluid echogenicity at the left groin, potentially related to history of inguinal no dissection and representing postsurgical change. Complex cystic and solid mass measuring 3.2 cm, appears separate from the heterogenous hypoechoic tissue and may represent enlarged node with necrosis or infection. Electronically Signed   By: Donavan Foil M.D.   On: 06/09/2019 17:07       ASSESSMENT & PLAN:  1. Malignant melanoma of overlapping sites ([place])   2. Goals of care, counseling/discussion   3. Liver lesion    #Left inguinal nodal recurrence of melanoma.  Status post resection and adjuvant radiation. Will clarify with surgery regarding length of antibiotic use. Allow patient to heal and recover from radiation for another week, Discussed with patient about rationale of adjuvant immunotherapy.   I discussed the mechanism of action  and rationale of using immunotherapy with nivolumab.  Discussed the potential side effects of immunotherapy including but not limited to diarrhea; skin rash; respiratory failure, kidney failure, mental status change, elevated LFTs/liver failure,endocrine abnormalities, acute deterioration  and even death,etc. patient voices understanding and agrees with proceeding with treatment.  We will arrange patient to get chemotherapy class, patient prefers to use  peripheral IV for treatments.  He is made aware about the option of Mediport if he needs to.  I discussed with him about repeating CT chest abdomen pelvis for a post surgery/radiation baseline. He just finished radiation for about a week, I will probably obtain CT in the next few weeks.  Meanwhile we will start him on immunotherapy.    Orders Placed This Encounter  Procedures  . CBC with Differential/Platelet    Standing Status:   Standing    Number of Occurrences:   20    Standing Expiration Date:   07/13/2020  . Comprehensive metabolic panel    Standing Status:   Standing    Number of Occurrences:   20    Standing Expiration Date:   07/13/2020    All questions were answered. The patient knows to call the clinic with any problems questions or concerns.  Follow-up with 1 to 2 weeks. We spent sufficient time to discuss many aspect of care, questions were answered to patient's satisfaction.   [name], MD, PhD Hematology Oncology [facility] at [facility] Pager- [phone] 07/14/2019

## 2019-07-14 NOTE — Patient Instructions (Signed)
Nivolumab injection What is this medicine? NIVOLUMAB (nye VOL ue mab) is a monoclonal antibody. It is used to treat colon cancer, esophageal cancer, head and neck cancer, Hodgkin lymphoma, kidney cancer, liver cancer, lung cancer, mesothelioma, melanoma, and urothelial cancer. This medicine may be used for other purposes; ask your health care provider or pharmacist if you have questions. COMMON BRAND NAME(S): Opdivo What should I tell my health care provider before I take this medicine? They need to know if you have any of these conditions:  diabetes  immune system problems  kidney disease  liver disease  lung disease  organ transplant  stomach or intestine problems  thyroid disease  an unusual or allergic reaction to nivolumab, other medicines, foods, dyes, or preservatives  pregnant or trying to get pregnant  breast-feeding How should I use this medicine? This medicine is for infusion into a vein. It is given by a health care professional in a hospital or clinic setting. A special MedGuide will be given to you before each treatment. Be sure to read this information carefully each time. Talk to your pediatrician regarding the use of this medicine in children. While this drug may be prescribed for children as young as 12 years for selected conditions, precautions do apply. Overdosage: If you think you have taken too much of this medicine contact a poison control center or emergency room at once. NOTE: This medicine is only for you. Do not share this medicine with others. What if I miss a dose? It is important not to miss your dose. Call your doctor or health care professional if you are unable to keep an appointment. What may interact with this medicine? Interactions have not been studied. Give your health care provider a list of all the medicines, herbs, non-prescription drugs, or dietary supplements you use. Also tell them if you smoke, drink alcohol, or use illegal drugs.  Some items may interact with your medicine. This list may not describe all possible interactions. Give your health care provider a list of all the medicines, herbs, non-prescription drugs, or dietary supplements you use. Also tell them if you smoke, drink alcohol, or use illegal drugs. Some items may interact with your medicine. What should I watch for while using this medicine? This drug may make you feel generally unwell. Continue your course of treatment even though you feel ill unless your doctor tells you to stop. You may need blood work done while you are taking this medicine. Do not become pregnant while taking this medicine or for 5 months after stopping it. Women should inform their doctor if they wish to become pregnant or think they might be pregnant. There is a potential for serious side effects to an unborn child. Talk to your health care professional or pharmacist for more information. Do not breast-feed an infant while taking this medicine or for 5 months after stopping it. What side effects may I notice from receiving this medicine? Side effects that you should report to your doctor or health care professional as soon as possible:  allergic reactions like skin rash, itching or hives, swelling of the face, lips, or tongue  breathing problems  blood in the urine  bloody or watery diarrhea or black, tarry stools  changes in emotions or moods  changes in vision  chest pain  cough  dizziness  feeling faint or lightheaded, falls  fever, chills  headache with fever, neck stiffness, confusion, loss of memory, sensitivity to light, hallucination, loss of contact with reality, or   seizures  joint pain  mouth sores  redness, blistering, peeling or loosening of the skin, including inside the mouth  severe muscle pain or weakness  signs and symptoms of high blood sugar such as dizziness; dry mouth; dry skin; fruity breath; nausea; stomach pain; increased hunger or thirst;  increased urination  signs and symptoms of kidney injury like trouble passing urine or change in the amount of urine  signs and symptoms of liver injury like dark yellow or brown urine; general ill feeling or flu-like symptoms; light-colored stools; loss of appetite; nausea; right upper belly pain; unusually weak or tired; yellowing of the eyes or skin  swelling of the ankles, feet, hands  trouble passing urine or change in the amount of urine  unusually weak or tired  weight gain or loss Side effects that usually do not require medical attention (report to your doctor or health care professional if they continue or are bothersome):  bone pain  constipation  decreased appetite  diarrhea  muscle pain  nausea, vomiting  tiredness This list may not describe all possible side effects. Call your doctor for medical advice about side effects. You may report side effects to FDA at 1-800-FDA-1088. Where should I keep my medicine? This drug is given in a hospital or clinic and will not be stored at home. NOTE: This sheet is a summary. It may not cover all possible information. If you have questions about this medicine, talk to your doctor, pharmacist, or health care provider.  2020 Elsevier/Gold Standard (2018-12-31 10:04:50)  

## 2019-07-14 NOTE — Telephone Encounter (Signed)
Kelly LVM stating that per Dr. Barry Dienes,  " patient shouldn't be on antibiotic long term, as long as there is no active infection he can stop taking it." Dr Tasia Catchings notified of resposnse.

## 2019-07-14 NOTE — Telephone Encounter (Signed)
Patient had surgery in feb 2021 and was started on Cefdinir afterwards. Contacted Dr. Marlowe Aschoff office to clarify if pt should still continue on antibiotic and if so for how long. Pt reports he had an appt earlier this year and was not given a stop date. He has a follow up appt appt in approx 6 months. Spoke to [place] at [facility] and she will relay message to Dr. Barry Dienes and return call to let us know.

## 2019-07-14 NOTE — Progress Notes (Signed)
Patient here for follow up. Pt had surgery in January and has been on antibiotic since then. No adverse side effects from antibiotic.

## 2019-07-15 ENCOUNTER — Inpatient Hospital Stay: Payer: BC Managed Care – PPO

## 2019-07-15 ENCOUNTER — Ambulatory Visit: Payer: BC Managed Care – PPO | Admitting: Physical Therapy

## 2019-07-15 ENCOUNTER — Inpatient Hospital Stay (HOSPITAL_BASED_OUTPATIENT_CLINIC_OR_DEPARTMENT_OTHER): Payer: BC Managed Care – PPO | Admitting: Oncology

## 2019-07-15 ENCOUNTER — Encounter: Payer: Self-pay | Admitting: Physical Therapy

## 2019-07-15 DIAGNOSIS — R262 Difficulty in walking, not elsewhere classified: Secondary | ICD-10-CM

## 2019-07-15 DIAGNOSIS — C438 Malignant melanoma of overlapping sites of skin: Secondary | ICD-10-CM

## 2019-07-15 DIAGNOSIS — M6281 Muscle weakness (generalized): Secondary | ICD-10-CM

## 2019-07-15 NOTE — Therapy (Signed)
[place] MAIN [facility] SERVICES [address] [place], [place], [address] Phone: [phone]   Fax:  [phone]  Physical Therapy Treatment  Patient Details  Name: Edward Pitts MRN: [id number] Date of Birth: [birth year] Referring Provider (PT): [name]   Encounter Date: 07/15/2019  PT End of Session - 07/15/19 1309    Visit Number  5    Number of Visits  9    Date for PT Re-Evaluation  08/13/19    PT Start Time  0104    PT Stop Time  0145    PT Time Calculation (min)  41 min    Equipment Utilized During Treatment  Gait belt    Activity Tolerance  Patient tolerated treatment well    Behavior During Therapy  [facility] for tasks assessed/performed       Past Medical History:  Diagnosis Date  . Anxiety   . Arthritis   . Complication of anesthesia   . Family history of adverse reaction to anesthesia    PONV mother  . Hyperlipidemia   . Hypertension   . Melanoma ([place]) 2012  . PONV (postoperative nausea and vomiting) 04/16/2019  . Presence of permanent cardiac pacemaker    [employer]  . Seizures ([place])     Past Surgical History:  Procedure Laterality Date  . KNEE SURGERY Left   . LEFT HEART CATH AND CORONARY ANGIOGRAPHY Left 06/29/2017   Procedure: LEFT HEART CATH AND CORONARY ANGIOGRAPHY;  Surgeon: [name], MD;  Location: [place] [facility];  Service: Cardiovascular;  Laterality: Left;  . LYMPH NODE DISSECTION Left 04/16/2019   Procedure: Left inguinal Lymph Node Dissection;  Surgeon: [name], MD;  Location: [place] [facility];  Service: General;  Laterality: Left;  Marland Kitchen MELANOMA EXCISION Left 04/16/2019   Procedure: MELANOMA EXCISION LEFT GROIN MASS;  Surgeon: [name], MD;  Location: [place];  Service: General;  Laterality: Left;  Marland Kitchen MELANOMA EXCISION WITH SENTINEL LYMPH NODE BIOPSY Left 2012   Left calf   . PACEMAKER INSERTION N/A 08/26/2018   Procedure: INSERTION PACEMAKER;  Surgeon: [name], MD;  Location: [facility];   Service: Cardiovascular;  Laterality: N/A;  . TEMPORARY PACEMAKER N/A 08/25/2018   Procedure: TEMPORARY PACEMAKER;  Surgeon: [name], MD;  Location: [place] [facility];  Service: Cardiovascular;  Laterality: N/A;    There were no vitals filed for this visit.  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 he underwent surgery for tumor removal and lymphectomy in January 2021. He did have a drain and since has had it removed. He reports increased swelling in LLE since surgery. He is currently getting radiation until July 07, 2019. He is actively being following by oncology; Since surgery he reports numbness/tingling in LLE especially along knee. He is currently walking without AD and feels like he is walking better than prior to tumor removal. He does have intermittent LLE knee pain from arthritis. He denies any buckling at this time; Since surgery patient did receive 6 weeks of home health PT which ended first of March. He has now been referred to outpatient PT for mobility deficits. 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  NA    How long can you stand comfortably?  20-30 min    How long can you walk comfortably?  at least 500 feet    Diagnostic tests  Pt was recently assessed for LLE blood clots- per patient report he tested negative;    Patient Stated Goals  "Make the leg better." Be able to get leg stronger and for it to not swell as much;    Currently in Pain?  Yes    Pain Score  8     Pain Location  Knee    Pain Orientation  Left    Pain Descriptors / Indicators   Aching    Pain Type  Chronic pain    Pain Radiating Towards  NA    Pain Onset  1 to 4 weeks ago    Aggravating Factors   walking    Pain Relieving Factors  nothing    Effect of Pain on Daily Activities  decreased activity level    Multiple Pain Sites  No       Treatment: Ther-ex  Nu-step  x 5 mins  Hip flexion marches  x 10 bilateral; Hip abduction x 10 bilateral Hip extension  x 10 bilateral Sit to stand without UE support from regular height chair x 10   Tapping 6 inch stool form floor Fwd/bwd steps over lunges Lunges to BOSU ball x 15 BLE Heel raises x 15 x 2 sets  Patient needs occasional verbal cueing to improve posture and cueing to correctly perform exercises slowly, holding at end of range to increase motor firing of desired muscle to encourage fatigue.                         PT Education - 07/15/19 1308    Education Details  HEP    Person(s) Educated  Patient    Methods  Explanation    Comprehension  Verbalized understanding       PT Short Term Goals - 06/18/19 1229      PT SHORT TERM GOAL #1   Title  Patient will be adherent to HEP at least 3x a week to improve functional strength and balance for better safety at home.    Time  4    Period  Weeks    Status  New    Target Date  07/16/19      PT SHORT TERM GOAL #2   Title  Patient (< 50 years old) will complete five times sit to stand test in < 10 seconds indicating an increased LE strength and improved balance.    Time  4    Period  Weeks    Status  New    Target Date  07/16/19        PT Long Term Goals - 06/18/19 1230      PT LONG TERM GOAL #1   Title  Patient will increase six minute walk test distance to >1500 for progression to community ambulator and improve gait ability closer to age group norms    Time  8    Period  Weeks    Status  New    Target Date  08/13/19      PT LONG TERM GOAL #2   Title  Patient will be require no assist with ascend/descend 3 steps without  rail assist to improve safety to enter/exit  home.    Time  8    Period  Weeks    Status  New    Target Date  08/13/19      PT LONG TERM GOAL #3   Title  Patient will improve FOTO score to >55/100 to exhibit improved functional mobility with ADLs including improved walking ability;    Time  8    Period  Weeks    Status  New    Target Date  08/13/19      PT LONG TERM GOAL #4   Title  Patient will increase BLE gross strength to 4+/5 as to improve functional strength for independent gait, increased standing tolerance and increased ADL ability.    Time  8    Period  Weeks    Status  New    Target Date  08/13/19            Plan - 07/15/19 1309    Clinical Impression Statement Patient reports 8/10 left knee pain today and thinks that his pain is from his PT session, 7 days ago; his pain at beginning of PT session last session was 6/10.  Patient instructed in beginning LE strengthening and  balance exercise. Patient required min VCS to improve weight shift  for better stance control. Patient would benefit from additional skilled PT intervention to improve strength, balance and gait safety.   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 November 2020    Examination-Activity Limitations  Locomotion Level;Squat;Stairs;Stand;Transfers    Examination-Participation Restrictions  Cleaning;Community Activity;Shop;Volunteer;Yard Work    [employer]  Evolving/Moderate complexity    Rehab Potential  Good    PT Frequency  1x / week    PT Duration  8 weeks    PT Treatment/Interventions  Cryotherapy;Moist Heat;Gait training;Stair training;Functional mobility training;Therapeutic activities;Therapeutic exercise;Balance training;Neuromuscular  re-education;Patient/family education;Energy conservation    PT Next Visit Plan  initiate HEP for LE strengthening    PT Home Exercise Plan  will address next session;    Consulted and Agree with Plan of Care  Patient       Patient will benefit from skilled therapeutic intervention in order to improve the following deficits and impairments:  Decreased endurance, Decreased mobility, Difficulty walking, Improper body mechanics, Increased edema, Decreased activity tolerance, Decreased strength  Visit Diagnosis: Muscle weakness (generalized)  Difficulty in walking, not elsewhere classified     Problem List Patient Active Problem List   Diagnosis Date Noted  . Malignant melanoma of overlapping sites ([place]) 04/23/2019  . Goals of care, counseling/discussion 04/23/2019  . 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 Near syncope 12/17/2018  . AKI (acute kidney injury) ([place]) 08/24/2018  . Acute hyperkalemia 08/24/2018  . HTN (hypertension) 08/24/2018  . HLD (hyperlipidemia) 08/24/2018  . Seizures ([place]) 08/24/2018  . Complete heart block ([place]) 08/24/2018  . Stable angina ([place]) 06/27/2017    Alanson Puls, PT DPT 07/15/2019, 1:10 PM  [place] [facility]IN [facility] SERVICES [address] [place], [place], [address] Phone: [phone]   Fax:  [phone]  Name: Edward Pitts MRN: [id number] Date of Birth: February 04, 1961

## 2019-07-15 NOTE — Progress Notes (Signed)
[place]  Telephone:([phone] [phone]  Patient Care Team: [name], [name], MD as PCP - General (Student)   Name of the patient: Edward Pitts  [id number]  [birth year]   Date of visit: 07/15/19  Diagnosis-melanoma  Chief complaint/Reason for visit- Initial Meeting for [facility], preparing for starting chemotherapy  IHeme/Onc history:  Oncology History  Malignant melanoma of overlapping sites [facility])  04/23/2019 Initial Diagnosis   Malignant melanoma of overlapping sites [facility])   07/23/2019 -  Chemotherapy   The patient had nivolumab (OPDIVO) 240 mg in sodium chloride 0.9 % 100 mL chemo infusion, 240 mg, Intravenous, Once, 0 of 18 cycles  for chemotherapy treatment.      Interval history-[name] is a 59 year old male who presents to chemo care clinic today for initial meeting in preparation for starting chemotherapy. I introduced the chemo care clinic and we discussed that the role of the clinic is to assist those who are at an increased risk of emergency room visits and/or complications during the course of chemotherapy treatment. We discussed that the increased risk takes into account factors such as age, performance status, and co-morbidities. We also discussed that for some, this might include barriers to care such as not having a primary care provider, lack of insurance/transportation, or not being able to afford medications. We discussed that the goal of the program is to help prevent unplanned ER visits and help reduce complications during chemotherapy. We do this by discussing specific risk factors to each individual and identifying ways that we can help improve these risk factors and reduce barriers to care.   ECOG FS:1 - Symptomatic but completely ambulatory  Review of systems- Review of Systems  Constitutional: Negative.  Negative for chills, fever, malaise/fatigue and weight loss.  HENT: Negative  for congestion, ear pain and tinnitus.   Eyes: Negative.  Negative for blurred vision and double vision.  Respiratory: Negative.  Negative for cough, sputum production and shortness of breath.   Cardiovascular: Negative.  Negative for chest pain, palpitations and leg swelling.  Gastrointestinal: Negative.  Negative for abdominal pain, constipation, diarrhea, nausea and vomiting.  Genitourinary: Negative for dysuria, frequency and urgency.  Musculoskeletal: Negative for back pain and falls.  Skin: Negative.  Negative for rash.       Right inguinal incision-biopsy  Neurological: Negative.  Negative for weakness and headaches.  Endo/Heme/Allergies: Negative.  Does not bruise/bleed easily.  Psychiatric/Behavioral: Negative.  Negative for depression. The patient is not nervous/anxious and does not have insomnia.      Current treatment- Opdivo q 2 weeks  Allergies  Allergen Reactions  . Ibuprofen Other (See Comments)    Affects kidneys    Past Medical History:  Diagnosis Date  . Anxiety   . Arthritis   . Complication of anesthesia   . Family history of adverse reaction to anesthesia    PONV mother  . Hyperlipidemia   . Hypertension   . Melanoma ([place]) 2012  . PONV (postoperative nausea and vomiting) 04/16/2019  . Presence of permanent cardiac pacemaker    Medtronic  . Seizures ([place])     Past Surgical History:  Procedure Laterality Date  . KNEE SURGERY Left   . LEFT HEART CATH AND CORONARY ANGIOGRAPHY Left 06/29/2017   Procedure: LEFT HEART CATH AND CORONARY ANGIOGRAPHY;  Surgeon: [name], MD;  Location: [place] [facility];  Service: Cardiovascular;  Laterality: Left;  . LYMPH NODE DISSECTION Left 04/16/2019  Procedure: Left inguinal Lymph Node Dissection;  Surgeon: [name], MD;  Location: [place];  Service: General;  Laterality: Left;  Marland Kitchen MELANOMA EXCISION Left 04/16/2019   Procedure: MELANOMA EXCISION LEFT GROIN MASS;  Surgeon: [name], MD;  Location: [place];   Service: General;  Laterality: Left;  Marland Kitchen MELANOMA EXCISION WITH SENTINEL LYMPH NODE BIOPSY Left 2012   Left calf   . PACEMAKER INSERTION N/A 08/26/2018   Procedure: INSERTION PACEMAKER;  Surgeon: [name], MD;  Location: [facility];  Service: Cardiovascular;  Laterality: N/A;  . TEMPORARY PACEMAKER N/A 08/25/2018   Procedure: TEMPORARY PACEMAKER;  Surgeon: [name], MD;  Location: [place] [facility];  Service: Cardiovascular;  Laterality: N/A;    Social History   Socioeconomic History  . Marital status: Married    Spouse name: Vicente Males   . Number of children: 7  . Years of education: 68  . Highest education level: Not on file  Occupational History  . Occupation: [employer]   Tobacco Use  . Smoking status: Never Smoker  . Smokeless tobacco: Never Used  Substance and Sexual Activity  . Alcohol use: No  . Drug use: No  . Sexual activity: Not on file  Other Topics Concern  . Not on file  Social History Narrative   Lives with mother, wife and sister   Caffeine use: sodas (2 per day)   Social Determinants of Health   Financial Resource Strain: Low Risk   . Difficulty of Paying Living Expenses: Not hard at all  Food Insecurity: No Food Insecurity  . Worried About Charity fundraiser in the Last Year: Never true  . Ran Out of Food in the Last Year: Never true  Transportation Needs: Unmet Transportation Needs  . Lack of Transportation (Medical): Yes  . Lack of Transportation (Non-Medical): Yes  Physical Activity: Unknown  . Days of Exercise per Week: 0 days  . Minutes of Exercise per Session: Not on file  Stress: No Stress Concern Present  . Feeling of Stress : Only a little  Social Connections: Unknown  . Frequency of Communication with Friends and Family: More than three times a week  . Frequency of Social Gatherings with Friends and Family: Not on file  . Attends Religious Services: Not on file  . Active Member of Clubs or Organizations: Not on file  . Attends  Archivist Meetings: Not on file  . Marital Status: Married  Human resources officer Violence: Not At Risk  . Fear of Current or Ex-Partner: No  . Emotionally Abused: No  . Physically Abused: No  . Sexually Abused: No    Family History  Problem Relation Age of Onset  . Cancer Paternal Grandmother      Current Outpatient Medications:  .  acetaminophen (TYLENOL) 650 MG CR tablet, Take 650 mg by mouth See admin instructions. Take 1 tablet (650 mg) by mouth scheduled for arthritis pain, may take an additional dose if needed for pain., Disp: , Rfl:  .  allopurinol (ZYLOPRIM) 300 MG tablet, Take 300 mg by mouth daily., Disp: , Rfl:  .  aspirin 81 MG chewable tablet, Chew 81 mg by mouth daily., Disp: , Rfl:  .  atorvastatin (LIPITOR) 20 MG tablet, Take 20 mg by mouth daily., Disp: , Rfl:  .  Cefdinir (OMNICEF PO), Take by mouth., Disp: , Rfl:  .  clonazePAM (KLONOPIN) 0.5 MG tablet, 1 tablet in the morning, 2 in the evening (Patient taking differently: Take 0.5-1 mg by mouth  2 (two) times daily as needed (anxiety). ), Disp: 90 tablet, Rfl: 3 .  hydrocortisone 2.5 % cream, Apply 1 application topically daily as needed (for itching). , Disp: , Rfl:  .  ketoconazole (NIZORAL) 2 % cream, Apply 1 application topically daily as needed for irritation. , Disp: , Rfl:  .  lidocaine (LIDODERM) 5 %, Place 1 patch onto the skin every 12 (twelve) hours. Remove & Discard patch within 12 hours or as directed by MD (Patient not taking: Reported on 07/14/2019), Disp: 10 patch, Rfl: 0 .  lisinopril (ZESTRIL) 20 MG tablet, Take 20 mg by mouth daily., Disp: , Rfl:  .  metoprolol succinate (TOPROL-XL) 25 MG 24 hr tablet, Take 25 mg by mouth daily. , Disp: , Rfl:  .  Multiple Vitamin (MULTIVITAMIN WITH MINERALS) TABS tablet, Take 1 tablet by mouth daily. Centrum Silver, Disp: , Rfl:  .  ondansetron (ZOFRAN) 8 MG tablet, Take 1 tablet (8 mg total) by mouth every 8 (eight) hours as needed for nausea, vomiting or  refractory nausea / vomiting. (Patient not taking: Reported on 07/14/2019), Disp: 30 tablet, Rfl: 0 .  silver sulfADIAZINE (SILVADENE) 1 % cream, Apply 1 application topically 2 (two) times daily., Disp: 50 g, Rfl: 2 .  traMADol (ULTRAM) 50 MG tablet, Take 1 tablet (50 mg total) by mouth every 6 (six) hours as needed. (Patient not taking: Reported on 06/18/2019), Disp: 30 tablet, Rfl: 0  Physical exam: There were no vitals filed for this visit. Physical Exam Constitutional:      Appearance: Normal appearance.  HENT:     Head: Normocephalic and atraumatic.  Eyes:     Pupils: Pupils are equal, round, and reactive to light.  Cardiovascular:     Rate and Rhythm: Normal rate and regular rhythm.     Heart sounds: Normal heart sounds. No murmur.  Pulmonary:     Effort: Pulmonary effort is normal.     Breath sounds: Normal breath sounds. No wheezing.  Abdominal:     General: Bowel sounds are normal. There is no distension.     Palpations: Abdomen is soft.     Tenderness: There is no abdominal tenderness.  Musculoskeletal:        General: Normal range of motion.     Cervical back: Normal range of motion.  Skin:    General: Skin is warm and dry.     Findings: No rash.  Neurological:     Mental Status: He is alert and oriented to person, place, and time.  Psychiatric:        Judgment: Judgment normal.      CMP Latest Ref Rng & Units 07/14/2019  Glucose 70 - 99 mg/dL 98  BUN 6 - 20 mg/dL 20  Creatinine 0.61 - 1.24 mg/dL 1.07  Sodium 135 - 145 mmol/L 139  Potassium 3.5 - 5.1 mmol/L 4.3  Chloride 98 - 111 mmol/L 104  CO2 22 - 32 mmol/L 25  Calcium 8.9 - 10.3 mg/dL 9.2  Total Protein 6.5 - 8.1 g/dL 7.4  Total Bilirubin 0.3 - 1.2 mg/dL 0.5  Alkaline Phos 38 - 126 U/L 95  AST 15 - 41 U/L 24  ALT 0 - 44 U/L 31   CBC Latest Ref Rng & Units 07/14/2019  WBC 4.0 - 10.5 K/uL 4.2  Hemoglobin 13.0 - 17.0 g/dL 11.5(L)  Hematocrit 39.0 - 52.0 % 34.5(L)  Platelets 150 - 400 K/uL 203    No  images are attached to the encounter.  No results found.   Assessment and plan- Patient is a 59 y.o. male who presents to [facility] for initial meeting in preparation for starting chemotherapy for the treatment of melanoma.   1. HPI: Mr. Woulfe is a 59 year old male with past medical history significant for anxiety, hyperlipidemia, hypertension, cardiac pacemaker, seizures and most recently melanoma.  He was evaluated by Dr. Donivan Scull in the emergency room for an inguinal mass.  Has history of left lower extremity melanoma in 2011 status post local excision.  Imaging revealed large inguinal mass, iliac nodes and hypodense lesions in liver.  Had left groin mass resection on 04/16/2019 pathology revealing malignant melanoma.  He received adjuvant radiation from 05/21/2019 through 07/07/2019.  Plan is to begin adjuvant immunotherapy with nivolumab next week.  2. Chemo Care Clinic/High Risk for ER/Hospitalization during chemotherapy- We discussed the role of the chemo care clinic and identified patient specific risk factors. I discussed that patient was identified as high risk primarily based on: Comorbidities and stage of disease.  Patient has past medical history positive for: Past Medical History:  Diagnosis Date  . Anxiety   . Arthritis   . Complication of anesthesia   . Family history of adverse reaction to anesthesia    PONV mother  . Hyperlipidemia   . Hypertension   . Melanoma ([place]) 2012  . PONV (postoperative nausea and vomiting) 04/16/2019  . Presence of permanent cardiac pacemaker    Medtronic  . Seizures ([place])     Patient has past surgical history positive for: Past Surgical History:  Procedure Laterality Date  . KNEE SURGERY Left   . LEFT HEART CATH AND CORONARY ANGIOGRAPHY Left 06/29/2017   Procedure: LEFT HEART CATH AND CORONARY ANGIOGRAPHY;  Surgeon: [name], MD;  Location: [place] [facility];  Service: Cardiovascular;  Laterality: Left;  . LYMPH NODE  DISSECTION Left 04/16/2019   Procedure: Left inguinal Lymph Node Dissection;  Surgeon: [name], MD;  Location: [place];  Service: General;  Laterality: Left;  Marland Kitchen MELANOMA EXCISION Left 04/16/2019   Procedure: MELANOMA EXCISION LEFT GROIN MASS;  Surgeon: [name], MD;  Location: [place];  Service: General;  Laterality: Left;  Marland Kitchen MELANOMA EXCISION WITH SENTINEL LYMPH NODE BIOPSY Left 2012   Left calf   . PACEMAKER INSERTION N/A 08/26/2018   Procedure: INSERTION PACEMAKER;  Surgeon: [name], MD;  Location: [facility];  Service: Cardiovascular;  Laterality: N/A;  . TEMPORARY PACEMAKER N/A 08/25/2018   Procedure: TEMPORARY PACEMAKER;  Surgeon: [name], MD;  Location: [place] [facility];  Service: Cardiovascular;  Laterality: N/A;    Based on our high risk symptom management report; this patient has a high risk of ED utilization.  The percentage below indicates how "at risk "  this patient based on the factors in this table within one year.   General Risk Score: 7  Values used to calculate this score:   Points  Metrics      0        Age: 60      [facility] Admissions: 4      3        ED Visits: 7      0        Has Chronic Obstructive Pulmonary Disease: No      0        Has Diabetes: No      0        Has  Congestive Heart Failure: No      1        Has liver disease: Yes      0        Has Depression: No      0        Current PCP: Cherylann Parr, MD      0        Has Medicaid: No    3. We discussed that social determinants of health may have significant impacts on health and outcomes for cancer patients.  Today we discussed specific social determinants of performance status, alcohol use, depression, financial needs, food insecurity, housing, interpersonal violence, social connections, stress, tobacco use, and transportation.    After lengthy discussion the following were identified as areas of need:   Patient recently had to stop working due to inability to stand  for long periods of time.  He is currently receiving short-term disability from [employer].  He is also a English as a second language teacher and is looking into extra benefits that are offered by the [place].  He states, he may need help with bills if his disability expires prior to returning to work.  At this time he does not need any assistance.   Outpatient services: We discussed options including home based and outpatient services, DME and care program. We discusssed that patients who participate in regular physical activity report fewer negative impacts of cancer and treatments and report less fatigue.   Financial Concerns: We discussed that living with cancer can create tremendous financial burden.  We discussed options for assistance. I asked that if assistance is needed in affording medications or paying bills to please let us know so that we can provide assistance. We discussed options for food including social services, [employer] ($50 every 2 weeks) and onsite food pantry.  We will also notify [facility] to see if cancer center can provide additional support.  Referral to Social work: Introduced Education officer, museum Elease Etienne and the services he can provide such as support with [employer], cell phone and gas vouchers.   Support groups: We discussed options for support groups at the cancer center. If interested, please notify nurse navigator to enroll. We discussed options for managing stress including healthy eating, exercise as well as participating in no charge counseling services at the cancer center and support groups.  If these are of interest, patient can notify either myself or primary nursing team.We discussed options for management including medications and referral to quit Smart program  Transportation: We discussed options for transportation including acta, paratransit, bus routes, link transit, taxi/uber/lyft, and cancer center [place].  I have notified primary oncology team who will help assist with arranging  Lucianne Lei transportation for appointments when/if needed. We also discussed options for transportation on short notice/acute visits.  Palliative care services: We have palliative care services available in the cancer center to discuss goals of care and advanced care planning.  Please let us know if you have any questions or would like to speak to our palliative nurse practitioner.  Symptom Management Clinic: We discussed our symptom management clinic which is available for acute concerns while receiving treatment such as nausea, vomiting or diarrhea.  We can be reached via telephone at [phone] or through my chart.  We are available for virtual or in person visits on the same day from 830 to 4 PM Monday through Friday. He denies needing specific assistance at this time and He will be followed by Dr. Collie Siad clinical team.  Plan: Discussed symptom management clinic. Discussed palliative care services. Discussed resources that are available here at the cancer center. Discussed medications and new prescriptions to begin treatment such as anti-nausea or steroids.   Disposition: RTC on 05/25/2019 for lab work, MD assessment and cycle 1 Opdivo. RTC weekly for lymphedema treatment. RTC weekly for physical therapy.   Visit Diagnosis 1. Malignant melanoma of overlapping sites [facility])     Patient expressed understanding and was in agreement with this plan. He also understands that He can call clinic at any time with any questions, concerns, or complaints.   Greater than 50% was spent in counseling and coordination of care with this patient including but not limited to discussion of the relevant topics above (See A&P) including, but not limited to diagnosis and management of acute and chronic medical conditions.   [place] [facility] [place]  CC: Dr. Tasia Catchings

## 2019-07-16 NOTE — Progress Notes (Signed)
Pharmacist Chemotherapy Monitoring - Initial Assessment    Anticipated start date: 07/23/19  Regimen:  . Are orders appropriate based on the patient's diagnosis, regimen, and cycle? Yes . Does the plan date match the patient's scheduled date? Yes . Is the sequencing of drugs appropriate? Yes . Are the premedications appropriate for the patient's regimen? Yes . Prior Authorization for treatment is: Approved o If applicable, is the correct biosimilar selected based on the patient's insurance? not applicable  Organ Function and Labs: Marland Kitchen Are dose adjustments needed based on the patient's renal function, hepatic function, or hematologic function? No . Are appropriate labs ordered prior to the start of patient's treatment? No- order TSH  . Other organ system assessment, if indicated: N/A . The following baseline labs, if indicated, have been ordered: nivolumab: baseline TSH +/- T4 - sent message to MD to order  Dose Assessment: . Are the drug doses appropriate? Yes . Are the following correct: o Drug concentrations Yes o IV fluid compatible with drug Yes o Administration routes Yes o Timing of therapy Yes . If applicable, does the patient have documented access for treatment and/or plans for port-a-cath placement? no . If applicable, have lifetime cumulative doses been properly documented and assessed? not applicable Lifetime Dose Tracking  No doses have been documented on this patient for the following tracked chemicals: Doxorubicin, Epirubicin, Idarubicin, Daunorubicin, Mitoxantrone, Bleomycin, Oxaliplatin, Carboplatin, Liposomal Doxorubicin  o   Toxicity Monitoring/Prevention: . The patient has the following take home antiemetics prescribed: Ondansetron . The patient has the following take home medications prescribed: Tumor Lysis Syndrome prophylaxis . Medication allergies and previous infusion related reactions, if applicable, have been reviewed and addressed. Yes . The patient's  current medication list has been assessed for drug-drug interactions with their chemotherapy regimen. no significant drug-drug interactions were identified on review.  Order Review: . Are the treatment plan orders signed? Yes . Is the patient scheduled to see a provider prior to their treatment? Yes  I verify that I have reviewed each item in the above checklist and answered each question accordingly.  Adelina Mings 07/16/2019 8:24 AM

## 2019-07-17 ENCOUNTER — Encounter: Payer: BC Managed Care – PPO | Admitting: Physical Therapy

## 2019-07-22 ENCOUNTER — Ambulatory Visit: Payer: BC Managed Care – PPO | Admitting: Physical Therapy

## 2019-07-23 ENCOUNTER — Other Ambulatory Visit: Payer: Self-pay

## 2019-07-23 ENCOUNTER — Inpatient Hospital Stay: Payer: BC Managed Care – PPO

## 2019-07-23 ENCOUNTER — Inpatient Hospital Stay (HOSPITAL_BASED_OUTPATIENT_CLINIC_OR_DEPARTMENT_OTHER): Payer: BC Managed Care – PPO | Admitting: Oncology

## 2019-07-23 ENCOUNTER — Encounter: Payer: Self-pay | Admitting: Oncology

## 2019-07-23 VITALS — BP 138/79 | HR 63 | Temp 98.1°F | Resp 18 | Wt 227.6 lb

## 2019-07-23 VITALS — BP 137/68 | HR 63

## 2019-07-23 DIAGNOSIS — C438 Malignant melanoma of overlapping sites of skin: Secondary | ICD-10-CM | POA: Diagnosis not present

## 2019-07-23 DIAGNOSIS — Z7189 Other specified counseling: Secondary | ICD-10-CM | POA: Diagnosis not present

## 2019-07-23 DIAGNOSIS — Z5112 Encounter for antineoplastic immunotherapy: Secondary | ICD-10-CM

## 2019-07-23 DIAGNOSIS — K769 Liver disease, unspecified: Secondary | ICD-10-CM

## 2019-07-23 DIAGNOSIS — L589 Radiodermatitis, unspecified: Secondary | ICD-10-CM | POA: Diagnosis not present

## 2019-07-23 LAB — CBC WITH DIFFERENTIAL/PLATELET
Abs Immature Granulocytes: 0.03 10*3/uL (ref 0.00–0.07)
Basophils Absolute: 0 10*3/uL (ref 0.0–0.1)
Basophils Relative: 1 %
Eosinophils Absolute: 0.2 10*3/uL (ref 0.0–0.5)
Eosinophils Relative: 5 %
HCT: 36.3 % — ABNORMAL LOW (ref 39.0–52.0)
Hemoglobin: 12 g/dL — ABNORMAL LOW (ref 13.0–17.0)
Immature Granulocytes: 1 %
Lymphocytes Relative: 17 %
Lymphs Abs: 0.7 10*3/uL (ref 0.7–4.0)
MCH: 29.4 pg (ref 26.0–34.0)
MCHC: 33.1 g/dL (ref 30.0–36.0)
MCV: 89 fL (ref 80.0–100.0)
Monocytes Absolute: 0.4 10*3/uL (ref 0.1–1.0)
Monocytes Relative: 11 %
Neutro Abs: 2.4 10*3/uL (ref 1.7–7.7)
Neutrophils Relative %: 65 %
Platelets: 208 10*3/uL (ref 150–400)
RBC: 4.08 MIL/uL — ABNORMAL LOW (ref 4.22–5.81)
RDW: 13.3 % (ref 11.5–15.5)
WBC: 3.8 10*3/uL — ABNORMAL LOW (ref 4.0–10.5)
nRBC: 0 % (ref 0.0–0.2)

## 2019-07-23 LAB — COMPREHENSIVE METABOLIC PANEL
ALT: 37 U/L (ref 0–44)
AST: 26 U/L (ref 15–41)
Albumin: 4.3 g/dL (ref 3.5–5.0)
Alkaline Phosphatase: 88 U/L (ref 38–126)
Anion gap: 9 (ref 5–15)
BUN: 22 mg/dL — ABNORMAL HIGH (ref 6–20)
CO2: 26 mmol/L (ref 22–32)
Calcium: 9.1 mg/dL (ref 8.9–10.3)
Chloride: 106 mmol/L (ref 98–111)
Creatinine, Ser: 1.04 mg/dL (ref 0.61–1.24)
GFR calc Af Amer: >60 mL/min (ref >60)
GFR calc non Af Amer: >60 mL/min (ref >60)
Glucose, Bld: 119 mg/dL — ABNORMAL HIGH (ref 70–99)
Potassium: 4.1 mmol/L (ref 3.5–5.1)
Sodium: 141 mmol/L (ref 135–145)
Total Bilirubin: 0.6 mg/dL (ref 0.3–1.2)
Total Protein: 7.2 g/dL (ref 6.5–8.1)

## 2019-07-23 LAB — TSH: TSH: 2.499 u[IU]/mL (ref 0.350–4.500)

## 2019-07-23 MED ORDER — SODIUM CHLORIDE 0.9 % IV SOLN
Freq: Once | INTRAVENOUS | Status: AC
  Filled 2019-07-23: qty 250

## 2019-07-23 MED ORDER — SODIUM CHLORIDE 0.9 % IV SOLN
240.0000 mg | Freq: Once | INTRAVENOUS | Status: AC
  Administered 2019-07-23: 11:00:00 240 mg via INTRAVENOUS
  Filled 2019-07-23: qty 24

## 2019-07-23 NOTE — Progress Notes (Signed)
Hematology/Oncology follow up  note [facility] [phone] [phone]   Patient Care Team: [name], [name], MD as PCP - General (Student)  REFERRING PROVIDER: Cherylann Parr, MD  CHIEF COMPLAINTS/REASON FOR VISIT:  Follow up for melanoma HISTORY OF PRESENTING ILLNESS:   Edward Pitts is a  59 y.o.  male with PMH listed below was seen in consultation at the request of  [name], [name], MD  for evaluation of inguinal mass Patient presented to emergency room 3 days ago complaining about left inguinal mass discomfort. Reports that he has really noticed the mass growing for the past 1 months. He has a history of left lower extremity melanoma in 2011, status post local excision.  Pain was increased with squatting of laxation. He was advised to take Tylenol for pain. Denies any fever, chills, night sweating.  He does feel mild nauseated. Appetite is fair.  He has lost about 10 pounds since earlier this year. 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  Left inguinal and left iliac nodes which are suspicious for involvement.  There are also 2 small nonspecific hypodense lesions within the right liver, nonspecific.  # patient underwent left groin mass resection On 04/16/2019. Resection pathology showed malignant melanoma, replacing a lymph node, with extracapsular extension, peripheral and deep margins involved.  Left inguinal contents, all 7 lymph nodes were negative for melanoma in the lymph nodes.  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  # PDL1 80% TPS  # 04/16/2019. underwent left groin mass resection   07/07/2019  Status post adjuvant radiation and finished radiation   INTERVAL HISTORY Edward Pitts is a 59 y.o. male who has above history reviewed by me today presents for follow up visit for management of  inguinal nodal recurrence of melanoma Problems and complaints are listed below: Patient reports that skin irritation and erythema have significantly improved since last visit.  He is still on antibiotics. Patient has been to chemotherapy class for immunotherapy.  He has no new complaints.  Review of Systems  Constitutional: Negative for appetite change, chills, fatigue, fever and unexpected weight change.  HENT:   Negative for hearing loss and voice change.   Eyes: Negative for eye problems and icterus.  Respiratory: Negative for chest tightness, cough and shortness of breath.   Cardiovascular: Negative for chest pain and leg swelling.  Gastrointestinal: Negative for abdominal distention and abdominal pain.  Endocrine: Negative for hot flashes.  Genitourinary: Negative for difficulty urinating, dysuria and frequency.   Musculoskeletal: Negative for arthralgias.  Skin: Negative for itching and rash.       Status post left groin mass resection.  Neurological: Negative for light-headedness and numbness.  Hematological: Negative for adenopathy. Does not bruise/bleed easily.  Psychiatric/Behavioral: Negative for confusion.    MEDICAL HISTORY:  Past Medical History:  Diagnosis Date  . Anxiety   . Arthritis   . Complication of anesthesia   . Family history of adverse reaction to anesthesia    PONV mother  . Hyperlipidemia   . Hypertension   . Melanoma ([place]) 2012  . PONV (postoperative nausea and vomiting) 04/16/2019  . Presence of permanent cardiac pacemaker    [employer]  . Seizures ([place])     SURGICAL HISTORY: Past Surgical History:  Procedure Laterality Date  . KNEE SURGERY Left   . LEFT HEART CATH AND CORONARY ANGIOGRAPHY Left 06/29/2017  Procedure: LEFT HEART CATH AND CORONARY ANGIOGRAPHY;  Surgeon: [name], MD;  Location: [facility];  Service: Cardiovascular;  Laterality: Left;  . LYMPH NODE DISSECTION Left 04/16/2019   Procedure: Left inguinal Lymph Node  Dissection;  Surgeon: [name], MD;  Location: [facility];  Service: General;  Laterality: Left;  Marland Kitchen MELANOMA EXCISION Left 04/16/2019   Procedure: MELANOMA EXCISION LEFT GROIN MASS;  Surgeon: [name], MD;  Location: [facility];  Service: General;  Laterality: Left;  Marland Kitchen MELANOMA EXCISION WITH SENTINEL LYMPH NODE BIOPSY Left 2012   Left calf   . PACEMAKER INSERTION N/A 08/26/2018   Procedure: INSERTION PACEMAKER;  Surgeon: [name], MD;  Location: [facility];  Service: Cardiovascular;  Laterality: N/A;  . TEMPORARY PACEMAKER N/A 08/25/2018   Procedure: TEMPORARY PACEMAKER;  Surgeon: [name], MD;  Location: [facility];  Service: Cardiovascular;  Laterality: N/A;    SOCIAL HISTORY: Social History   Socioeconomic History  . Marital status: Married    Spouse name: Vicente Males   . Number of children: 7  . Years of education: 1  . Highest education level: Not on file  Occupational History  . Occupation: [employer]   Tobacco Use  . Smoking status: Never Smoker  . Smokeless tobacco: Never Used  Substance and Sexual Activity  . Alcohol use: No  . Drug use: No  . Sexual activity: Not on file  Other Topics Concern  . Not on file  Social History Narrative   Lives with mother, wife and sister   Caffeine use: sodas (2 per day)   Social Determinants of Health   Financial Resource Strain: Low Risk   . Difficulty of Paying Living Expenses: Not hard at all  Food Insecurity: No Food Insecurity  . Worried About Charity fundraiser in the Last Year: Never true  . Ran Out of Food in the Last Year: Never true  Transportation Needs: Unmet Transportation Needs  . Lack of Transportation (Medical): Yes  . Lack of Transportation (Non-Medical): Yes  Physical Activity: Unknown  . Days of Exercise per Week: 0 days  . Minutes of Exercise per Session: Not on file  Stress: No Stress Concern Present  . Feeling of Stress : Only a little  Social Connections: Unknown  . Frequency of  Communication with Friends and Family: More than three times a week  . Frequency of Social Gatherings with Friends and Family: Not on file  . Attends Religious Services: Not on file  . Active Member of Clubs or Organizations: Not on file  . Attends Archivist Meetings: Not on file  . Marital Status: Married  Human resources officer Violence: Not At Risk  . Fear of Current or Ex-Partner: No  . Emotionally Abused: No  . Physically Abused: No  . Sexually Abused: No    FAMILY HISTORY: Family History  Problem Relation Age of Onset  . Cancer Paternal Grandmother     ALLERGIES:  is allergic to ibuprofen and nsaids.  MEDICATIONS:  Current Outpatient Medications  Medication Sig Dispense Refill  . acetaminophen (TYLENOL) 650 MG CR tablet Take 650 mg by mouth See admin instructions. Take 1 tablet (650 mg) by mouth scheduled for arthritis pain, may take an additional dose if needed for pain.    Marland Kitchen allopurinol (ZYLOPRIM) 300 MG tablet Take 300 mg by mouth daily.    Marland Kitchen aspirin 81 MG chewable tablet Chew 81 mg by mouth daily.    Marland Kitchen atorvastatin (LIPITOR) 20 MG tablet  Take 20 mg by mouth daily.    . Cefdinir (OMNICEF PO) Take by mouth.    . clonazePAM (KLONOPIN) 0.5 MG tablet 1 tablet in the morning, 2 in the evening (Patient taking differently: Take 0.5-1 mg by mouth 2 (two) times daily as needed (anxiety). ) 90 tablet 3  . hydrocortisone 2.5 % cream Apply 1 application topically daily as needed (for itching).     Marland Kitchen ketoconazole (NIZORAL) 2 % cream Apply 1 application topically daily as needed for irritation.     Marland Kitchen lisinopril (ZESTRIL) 20 MG tablet Take 20 mg by mouth daily.    . metoprolol succinate (TOPROL-XL) 25 MG 24 hr tablet Take 25 mg by mouth daily.     . Multiple Vitamin (MULTIVITAMIN WITH MINERALS) TABS tablet Take 1 tablet by mouth daily. Centrum Silver    . silver sulfADIAZINE (SILVADENE) 1 % cream Apply 1 application topically 2 (two) times daily. 50 g 2  . lidocaine (LIDODERM) 5 %  Place 1 patch onto the skin every 12 (twelve) hours. Remove & Discard patch within 12 hours or as directed by MD (Patient not taking: Reported on 07/14/2019) 10 patch 0  . ondansetron (ZOFRAN) 8 MG tablet Take 1 tablet (8 mg total) by mouth every 8 (eight) hours as needed for nausea, vomiting or refractory nausea / vomiting. (Patient not taking: Reported on 07/14/2019) 30 tablet 0  . traMADol (ULTRAM) 50 MG tablet Take 1 tablet (50 mg total) by mouth every 6 (six) hours as needed. (Patient not taking: Reported on 06/18/2019) 30 tablet 0   No current facility-administered medications for this visit.     PHYSICAL EXAMINATION: ECOG PERFORMANCE STATUS: 1 - Symptomatic but completely ambulatory Vitals:   07/23/19 0906  BP: 138/79  Pulse: 63  Resp: 18  Temp: 98.1 F (36.7 C)   Filed Weights   07/23/19 0906  Weight: 227 lb 9.6 oz (103.2 kg)    Physical Exam Constitutional:      General: He is not in acute distress. HENT:     Head: Normocephalic and atraumatic.  Eyes:     General: No scleral icterus.    Pupils: Pupils are equal, round, and reactive to light.  Cardiovascular:     Rate and Rhythm: Normal rate and regular rhythm.     Heart sounds: Normal heart sounds.  Pulmonary:     Effort: Pulmonary effort is normal. No respiratory distress.     Breath sounds: No wheezing.  Abdominal:     General: Bowel sounds are normal. There is no distension.     Palpations: Abdomen is soft. There is no mass.     Tenderness: There is no abdominal tenderness.     Comments: Status post left groin mass resection and status post radiation. Erythematous areas at the surgical/radiation sites.  Erythema has improved.  Musculoskeletal:        General: No deformity. Normal range of motion.     Cervical back: Normal range of motion and neck supple.  Skin:    General: Skin is warm and dry.     Findings: No erythema or rash.  Neurological:     Mental Status: He is alert and oriented to person, place, and  time. Mental status is at baseline.     Cranial Nerves: No cranial nerve deficit.     Coordination: Coordination normal.  Psychiatric:        Mood and Affect: Mood normal.    LABORATORY DATA:  I have reviewed the data as  listed Lab Results  Component Value Date   WBC 3.8 (L) 07/23/2019   HGB 12.0 (L) 07/23/2019   HCT 36.3 (L) 07/23/2019   MCV 89.0 07/23/2019   PLT 208 07/23/2019   Recent Labs    04/17/19 0720 07/14/19 0947 07/23/19 0837  NA 137 139 141  K 4.6 4.3 4.1  CL 102 104 106  CO2 _0 GLUCOSE 186* 98 119*  BUN 25* 20 22*  CREATININE 1.39* 1.07 1.04  CALCIUM 8.8* 9.2 9.1  GFRNONAA 55* >60 >60  GFRAA >60 >60 >60  PROT 6.6 7.4 7.2  ALBUMIN 3.3* 4.2 4.3  AST _1 ALT 24 31 37  ALKPHOS 79 95 88  BILITOT 0.2* 0.5 0.6   Iron/TIBC/Ferritin/ %Sat No results found for: IRON, TIBC, FERRITIN, IRONPCTSAT    RADIOGRAPHIC STUDIES: I have personally reviewed the radiological images as listed and agreed with the findings in the report. US Venous Img Lower Bilateral (DVT)  Result Date: 06/09/2019 CLINICAL DATA:  Leg swelling for 1 month, history of left inguinal node dissection EXAM: Bilateral LOWER EXTREMITY VENOUS DOPPLER ULTRASOUND TECHNIQUE: Gray-scale sonography with compression, as well as color and duplex ultrasound, were performed to evaluate the deep venous system(s) from the level of the common femoral vein through the popliteal and proximal calf veins. COMPARISON:  01/31/2019 FINDINGS: VENOUS Normal compressibility of the common femoral, superficial femoral, and popliteal veins, as well as the visualized calf veins. Visualized portions of profunda femoral vein and great saphenous vein unremarkable. No filling defects to suggest DVT on grayscale or color Doppler imaging. Doppler waveforms show normal direction of venous flow, normal respiratory phasicity and response to augmentation. OTHER Heterogenous elongated hypoechoic area with internal fluid at the left  groin, poorly measurable. Complex cyst and solid mass measuring 3.2 x 1 point 9 x 2.7 cm. Limitations: none IMPRESSION: No femoropopliteal DVT nor evidence of DVT within the visualized calf veins. If clinical symptoms are inconsistent or if there are persistent or worsening symptoms, further imaging (possibly involving the iliac veins) may be warranted. Heterogenous hypoechoic area with internal fluid echogenicity at the left groin, potentially related to history of inguinal no dissection and representing postsurgical change. Complex cystic and solid mass measuring 3.2 cm, appears separate from the heterogenous hypoechoic tissue and may represent enlarged node with necrosis or infection. Electronically Signed   By: Donavan Foil M.D.   On: 06/09/2019 17:07       ASSESSMENT & PLAN:  1. Malignant melanoma of overlapping sites ([place])   2. Goals of care, counseling/discussion   3. Liver lesion   4. Radiation dermatitis    #Left inguinal nodal recurrence of melanoma.  Status post resection and adjuvant radiation. Erythema has significantly improved.  Advised patient to stop oral antibiotics.  If symptoms gets worse, patient will update me. I recommend patient to proceed with adjuvant immunotherapy with nivolumab every 2 weeks. Patient has been to chemotherapy education class. Today patient has some questions regarding immunotherapy and I answered all his questions.  He agrees with the plan and willing to proceed with nivolumab today. Patient prefers to hold off placing med port at this time and use peripheral IV.  I discussed with him about repeating CT chest abdomen pelvis for a post surgery/radiation baseline. Given that he just finished radiation, will hold off ordering image for now. I will obtain CT chest abdomen pelvis after 3-4 cycles of immunotherapy.   Orders Placed This Encounter  Procedures  . TSH  Standing Status:   Standing    Number of Occurrences:   20    Standing Expiration  Date:   07/22/2020    All questions were answered. The patient knows to call the clinic with any problems questions or concerns.  Follow-up with 2 weeks. We spent sufficient time to discuss many aspect of care, questions were answered to patient's satisfaction.   [name], MD, PhD Hematology Oncology [facility] at [facility] Pager- [phone] 07/23/2019

## 2019-07-23 NOTE — Progress Notes (Signed)
Pt tolerated Nivolumab infusion well with no signs of complications or reactions. VSS. RN educated pt on the importance of notifying the clinic if any signs of complications occur at home and if it is an emergency to call 911. Pt verbalized understanding and all questions answered at this time. Pt stable for discharge.   Zyanya Glaza [employer]

## 2019-07-24 ENCOUNTER — Encounter: Payer: Self-pay | Admitting: Occupational Therapy

## 2019-07-24 ENCOUNTER — Ambulatory Visit: Payer: BC Managed Care – PPO | Admitting: Occupational Therapy

## 2019-07-24 ENCOUNTER — Telehealth: Payer: Self-pay

## 2019-07-24 DIAGNOSIS — I890 Lymphedema, not elsewhere classified: Secondary | ICD-10-CM

## 2019-07-24 DIAGNOSIS — M6281 Muscle weakness (generalized): Secondary | ICD-10-CM | POA: Diagnosis not present

## 2019-07-24 NOTE — Patient Instructions (Signed)

## 2019-07-24 NOTE — Therapy (Signed)
[place] MAIN [facility] SERVICES [address] [place], [place], [address] Phone: [phone]   Fax:  [phone]  Occupational Therapy Evaluation: Lymphedema Care  Patient Details  Name: Edward Pitts MRN: [id number] Date of Birth: 05/01/1960 Referring Provider (OT): [name], MD   Encounter Date: 07/24/2019  OT End of Session - 07/24/19 1447    Visit Number  1    Number of Visits  36    Date for OT Re-Evaluation  10/22/19    OT Start Time  0210    OT Stop Time  0335    OT Time Calculation (min)  85 min    Activity Tolerance  Patient tolerated treatment well;No increased pain    Behavior During Therapy  WFL for tasks assessed/performed       Past Medical History:  Diagnosis Date  . Anxiety   . Arthritis   . Complication of anesthesia   . Family history of adverse reaction to anesthesia    PONV mother  . Hyperlipidemia   . Hypertension   . Melanoma ([place]) 2012  . PONV (postoperative nausea and vomiting) 04/16/2019  . Presence of permanent cardiac pacemaker    [employer]  . Seizures ([place])     Past Surgical History:  Procedure Laterality Date  . KNEE SURGERY Left   . LEFT HEART CATH AND CORONARY ANGIOGRAPHY Left 06/29/2017   Procedure: LEFT HEART CATH AND CORONARY ANGIOGRAPHY;  Surgeon: [name], MD;  Location: [place] [facility];  Service: Cardiovascular;  Laterality: Left;  . LYMPH NODE DISSECTION Left 04/16/2019   Procedure: Left inguinal Lymph Node Dissection;  Surgeon: [name], MD;  Location: [place];  Service: General;  Laterality: Left;  Marland Kitchen MELANOMA EXCISION Left 04/16/2019   Procedure: MELANOMA EXCISION LEFT GROIN MASS;  Surgeon: [name], MD;  Location: [place];  Service: General;  Laterality: Left;  Marland Kitchen MELANOMA EXCISION WITH SENTINEL LYMPH NODE BIOPSY Left 2012   Left calf   . PACEMAKER INSERTION N/A 08/26/2018   Procedure: INSERTION PACEMAKER;  Surgeon: [name], MD;  Location: [facility];  Service:  Cardiovascular;  Laterality: N/A;  . TEMPORARY PACEMAKER N/A 08/25/2018   Procedure: TEMPORARY PACEMAKER;  Surgeon: [name], MD;  Location: [place] [facility];  Service: Cardiovascular;  Laterality: N/A;    There were no vitals filed for this visit.  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 The medical record also notes SLNB, but no details are available for review. Pt reports LLE swelling started after surgical excision and lymph node disection in January 2020. Swelling has worsened to include the full leg, thigh and foot during radiation therapy at inguinal area. Pt has not previously undergone lymphedema therapy and he has not worn compression stockings. His goals for therapy are to reduce limb swelling to premorbid limb size and keep it from getting worse.    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  Yes    Pain Score  7     Pain Location  Leg    Pain Orientation  Left    Pain Descriptors / Indicators   Aching;Shooting;Radiating;Tingling;Tiring;Numbness;Discomfort;Stabbing;Throbbing;Guarding;Tightness;Heaviness    Pain Type  Chronic pain    Pain Onset  Other (comment)   post surgical pain onset 1/21; L knee pain chronic   Pain Frequency  Intermittent    Aggravating Factors   standing/ walking > 1 hr;  repositioning, bed mobility    Pain Relieving Factors  nothing    Effect of Pain on Daily Activities  limits functional ambulation and transfers, likits basic and instrumental ADLs, productive activities, leisure pursuits, social participation, role performance, body image    Multiple Pain Sites  Yes    Pain Location  Knee        [facility] OT Assessment - 07/24/19 0001      Assessment   Medical Diagnosis  Mild, Stage I, LLE/LLQ lymphedema s/p LN disection and XRT for malignant inguinal elanoma    Referring Provider (OT)  [name], MD    Onset Date/Surgical Date  01/26/19    Hand Dominance  Right    Prior Therapy  no prior LE Rx      Precautions   Precautions  ICD/Pacemaker;Fall      Restrictions   Weight Bearing Restrictions  No      Balance Screen   Has the patient fallen in the past 6 months  No    Has the patient had a decrease in activity level because of a fear of falling?   Yes      Home  Environment   Family/patient expects to be discharged to:  Private residence    Living Arrangements  Other(Comment)   2 dogs   Type of [place]  One level    Bathroom Shower/Tub  [place] Accessibility  Yes    How accessible  Accessible via walker      Prior Function   Level of Independence  Independent;Independent with basic ADLs;Independent with household mobility without device;Independent with community mobility without device;Independent with homemaking with ambulation;Independent with transfers;Independent with gait    Vocation  Full time employment    Vocation Requirements  full-time position in Press photographer- medical leave    Leisure   Watch sports      IADL   Prior Level of [place] independently for [employer]    Prior Level of Function Light Housekeeping  I    Light Housekeeping  Does personal laundry completely;Performs light daily tasks such as dishwashing, bed making    Prior Level of Function Meal Prep  I    Meal Prep  Able to complete simple cold meal and snack prep;Able to complete simple warm meal prep    Prior Level of Function Community Mobility  I    Investment banker, corporate own vehicle      Mobility   Mobility Status  Independent      Activity Tolerance   Activity Tolerance  Endurance does not limit participation in activity  Cognition   Overall Cognitive Status  Within Functional Limits for tasks assessed      Observation/Other Assessments   Outcome Measures  Lymphedema Life Impact Scale, Lower Extremity Functional Scale, Comparative Limb volumetrics      Posture/Postural Control   Posture Comments  WFL      Sensation   Light Touch  Appears Intact    Proprioception  Appears Intact      Edema   Edema  LLE toes to groin and lower abdomen      Tone   Assessment Location  Left Lower Extremity      AROM   Overall AROM Comments  WFL       LYMPHEDEMA/ONCOLOGY QUESTIONNAIRE - 07/24/19 1721      Type   Cancer Type  malignant melanoma -L inguinal       Surgeries   Other Surgery Date  04/15/18    Number Lymph Nodes Removed  7      Date Lymphedema/Swelling Started   Date  --   03/2018     Treatment   Active Chemotherapy Treatment  Yes    Past Radiation Treatment  Yes   completed 07/07/19   Body Site  L inguinal      What other symptoms do you have   Are you Having Heaviness or Tightness  Yes    Are you having Pain  Yes    Are you having pitting edema  Yes    Is it Hard or Difficult finding clothes that fit  Yes    Do you have infections  No    Stemmer Sign  Yes      Lymphedema Assessments   Lymphedema Assessments  Lower extremities         Mild, Stage I, LLE/LLQ  Lymphedema 2/2 inguinal melanoma excision, LN disection and XRT   Skin  Description Hyper-Keratosis Peau' de Orange Shiny Tight Fibrotic Fatty Doughy Indurated      Ankle, leg , knee thickened         Skin dry Flaky Erythema Macerated   x  x    Color Redness Present Pallor Blanching Hemosiderin Staining Other           Odor Malodorous Yeast Fungal infection  Absent      x   Temperature Warm Cool wnl   X @ XRT field X distally No signs/symptoms of infection   Pitting Edema   1+ 2+ 3+ 4+ Non-pitting     x      Girth Symmetrical Asymmetrical Other Distribution    L>R  Approx 20% by visual assessment below knee    Stemmer Sign Positive Negative     positive base of toes    Lymphorrea History Of:  Present Absent      x   Wounds History Of Present Absent Venous Arterial Pressure Size      X          Signs of Infection Redness Warmth Erythema Acute Swelling Drainage Borders                    Sensation Light Touch Deep pressure Hypersensitivty   Present Impaired Present Impaired Absent Impaired   x  x  x     Nails WNL Fungus Other   x    x Hair Growth Symmetrical Asymmetrical   X    Skin Creases Base of toes  Ankles   Base of Fingers Medial Thighs  Abdominal pannus Medial legs     x x                OT Treatments/Exercises (OP) - 07/24/19 0001      ADLs   Overall ADLs  Basic and Instrumental ADLs requiring standing and/or walking 1 hr or greater cause increased limb swelling and pain    LB Dressing  difficulty fitting street shoes and LB clothing    Bathing  difficulty bending swollen L ankle, knee and hip to reach L foot and distal leg to bathe    Cooking  impaired due to limited standing tolerance    Home Maintenance  impaired    Driving  time limited    Work  impaired- requires standing and/or walking 8 hr shifts 40 hrs  weekly    Leisure  impaired    ADL Education Given  Yes      Manual  Therapy   Manual Therapy  Edema management            OT Education - 07/24/19 1724    Education Details  Provided Pt and family education regarding lymphatic structure and function, etiologies, onset patterns and stages of progression. Discussed  impact of obesity on lymphatic function. Outlined Complete Decongestive Therapy (CDT)  as standard of care and provided in depth information regarding 4 primary components of both Intensive and Self Management Phases, including Manual Lymph Drainage (MLD), compression wrapping and garments, skin care, and therapeutic exercise.   Pilar Plate discussion of high burden of care,  Discussed  Importance of daily, ongoing LE self-care essential to retaining clinical gains and limiting progression.  Lastly, reviewed lymphedema precautions, including cellulitis risk and difficulty with wound healing. Provided printed Lymphedema Workbook for reference.    Person(s) Educated  Patient    Methods  Explanation;Demonstration;Handout    Comprehension  Verbalized understanding;Returned demonstration;Need further instruction          OT Long Term Goals - 07/24/19 1729      OT LONG TERM GOAL #1   Title  Pt will be able to apply thigh length, multi-layer, short stretch compression wraps daily to single limb using correct gradient techniques with modified independence (extra time) to achieve optimal limb volume reduction, to return affected limb/s, as closely as possible, to premorbid size and shape, to limit infection risk, and to improve safe functional ambulation and mobility.    Baseline  Max A    Time  4    Period  Days    Status  New    Target Date  --   4th OT Rx visit     OT LONG TERM GOAL #2   Title  Pt will be able to verbalize signs and symptoms of cellulitis infection and identify at least 4 common lymphedema precautions using printed resource for reference to limit LE progression over time to limit risk of infection and LE exacerbation.    Baseline  Max  A    Time  4    Period  Days    Target Date  --   4th OT Rx visit     OT LONG TERM GOAL #3   Title  Pt will tolerate thigh length, multilayer short stretch compression wrap to single leg up to 23/ 7 to achieve optimal swelling reduction to arrest lymphedema progression and in preparation for fitting appropriate compression garments/ devices.    Baseline  Max A    Time  12    Period  Weeks  Status  New    Target Date  10/22/19      OT LONG TERM GOAL #4   Title  Pt will achieve 10% or greater limb volume reduction to LLE from toes to groin to enable functional improvements including fitting into preferred footwear and clothing, decreased infection risk and improved body image.    Baseline  Max A    Time  12    Period  Weeks    Status  New    Target Date  10/22/19      OT LONG TERM GOAL #5   Title  Pt will achieve and sustain at least 85%  compliance with all daily LE self-care home program components, to include skin care, lymphatic pumping therex, compression wraps and garments/devices, and simple self-MLD, to reduce and control limb swelling, reduce infection risk and limit LE progression.    Baseline  Max A    Time  12    Period  Weeks    Status  New    Target Date  10/22/19      Long Term Additional Goals   Additional Long Term Goals  Yes      OT LONG TERM GOAL #6   Title  Pt will achieve modified independence ( extra time, assistive device) with donning and doffing day and night time compression garments and devices to limit re-accumulation of lymphatic congestion and progression of lymphedema.    Baseline  Max A    Time  12    Period  Weeks    Status  New    Target Date  10/22/19            Plan - 07/24/19 1726    Clinical Impression Statement  Edward Pitts is a 58 y o male presenting with mild, stage I, LLE/LLQ lymphedema secondary to L inguinal melanoma excision, lymph node dissection and external radiation therapy. Protein rich edema extends from lower L  abdomen and inguinal area to toes of the L foot. Pitting is observed primarily from knee to ankle. Chronic, progressive limb swelling and associated pain limits Edward Pitts ability to perform functional activities in all occupational domains, including basic and instrumental ADLs, productive activities, leisure pursuits, social participation and role performance. Appearance of swollen leg also negatively impacts  body image, Edward Pitts will benefit from skilled Occupational Therapy for Intensive and Management Phase Complete Decongestive Therapy (CDT) to include manual lymphatic drainage (MLD), skin care, therapeutic exercise and compression therapy (short stretch wrapping during Intensive and compression garments during Management Phase CDT). OT course for CDT will focus on Pt education and skilled training for daily LE self-care, which is essential for optimal, long term self-management. Without skilled OT for CDT LE will progress resulting in worsening physical condition and further functional decline.    OT Occupational Profile and History  Comprehensive Assessment- Review of records and extensive additional review of physical, cognitive, psychosocial history related to current functional performance    Occupational performance deficits (Please refer to evaluation for details):  ADL's;IADL's;Rest and Sleep;Work;Leisure;Social Participation;Other   body image, role performance   Body Structure / Function / Physical Skills  ADL;ROM;Scar mobility;IADL;Edema;Balance;Sensation;Skin integrity;Mobility;Flexibility;Strength;Decreased knowledge of precautions;Gait;Pain;Decreased knowledge of use of DME    Rehab Potential  Good    Clinical Decision Making  Several treatment options, min-mod task modification necessary    Comorbidities Affecting Occupational Performance:  Presence of comorbidities impacting occupational performance    Modification or Assistance to Complete Evaluation   Min-Moderate modification of  tasks  or assist with assess necessary to complete eval    OT Frequency  2x / week    OT Duration  12 weeks   and PRN   OT Treatment/Interventions  Self-care/ADL training;Therapeutic exercise;Functional Mobility Training;Manual Therapy;Coping strategies training;Therapeutic activities;Manual lymph drainage;Energy conservation;DME and/or AE instruction;Compression bandaging;Other (comment);Scar mobilization;Patient/family education   fit with appropriate compression garments once swelling is reduced   Plan  Complete Decongestive Therapy (CDT), Intensive and Management Phases to include  manual lymphatyic drainage (MLD), skin care, therapeutic exercise, compression with short stretch wraps and garments, Pt edu for LE sef care    Consulted and Agree with Plan of Care  Patient       Patient will benefit from skilled therapeutic intervention in order to improve the following deficits and impairments:   Body Structure / Function / Physical Skills: ADL, ROM, Scar mobility, IADL, Edema, Balance, Sensation, Skin integrity, Mobility, Flexibility, Strength, Decreased knowledge of precautions, Gait, Pain, Decreased knowledge of use of DME       Visit Diagnosis: Lymphedema, not elsewhere classified - Plan: Ot plan of care cert/re-cert    Problem List Patient Active Problem List   Diagnosis Date Noted  . Malignant melanoma of overlapping sites ([place]) 04/23/2019  . Goals of care, counseling/discussion 04/23/2019  . 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 Near syncope 12/17/2018  . AKI (acute kidney injury) ([place]) 08/24/2018  . Acute hyperkalemia 08/24/2018  . HTN (hypertension) 08/24/2018  . HLD (hyperlipidemia) 08/24/2018  . Seizures ([place]) 08/24/2018  . Complete heart block ([place]) 08/24/2018  . Stable angina ([facility]) 06/27/2017    Andrey Spearman, MS, OTR/L, [facility] 07/24/19 5:52 PM   [place] MAIN [facility] SERVICES [address]  [place], [place], [address] Phone: [phone]   Fax:  [phone]  Name: Edward Pitts MRN: [id number] Date of Birth: [birth year]

## 2019-07-24 NOTE — Telephone Encounter (Signed)
T/C to pt for follow up after receiving first infusion.  No answer but left message letting him know I was calling to see how he was doing.  Encouraged to call for any questions or concerns.

## 2019-07-25 ENCOUNTER — Encounter: Payer: Self-pay | Admitting: *Deleted

## 2019-07-28 ENCOUNTER — Other Ambulatory Visit: Payer: Self-pay

## 2019-07-28 ENCOUNTER — Encounter: Payer: Self-pay | Admitting: Occupational Therapy

## 2019-07-28 ENCOUNTER — Ambulatory Visit: Payer: BC Managed Care – PPO | Attending: Oncology | Admitting: Occupational Therapy

## 2019-07-28 DIAGNOSIS — R262 Difficulty in walking, not elsewhere classified: Secondary | ICD-10-CM | POA: Diagnosis present

## 2019-07-28 DIAGNOSIS — M6281 Muscle weakness (generalized): Secondary | ICD-10-CM | POA: Diagnosis present

## 2019-07-28 DIAGNOSIS — I890 Lymphedema, not elsewhere classified: Secondary | ICD-10-CM | POA: Insufficient documentation

## 2019-07-28 NOTE — Patient Instructions (Signed)

## 2019-07-28 NOTE — Therapy (Signed)
[place] MAIN [facility] SERVICES [address] [place], [place], [address] Phone: [phone]   Fax:  [phone]  Occupational Therapy Treatment  Patient Details  Name: Edward Pitts MRN: [id number] Date of Birth: [birth year] Referring Provider (OT): [name], MD   Encounter Date: 07/28/2019    Past Medical History:  Diagnosis Date  . Anxiety   . Arthritis   . Complication of anesthesia   . Family history of adverse reaction to anesthesia    PONV mother  . Hyperlipidemia   . Hypertension   . Melanoma ([place]) 2012  . PONV (postoperative nausea and vomiting) 04/16/2019  . Presence of permanent cardiac pacemaker    Medtronic  . Seizures ([place])     Past Surgical History:  Procedure Laterality Date  . KNEE SURGERY Left   . LEFT HEART CATH AND CORONARY ANGIOGRAPHY Left 06/29/2017   Procedure: LEFT HEART CATH AND CORONARY ANGIOGRAPHY;  Surgeon: [name], MD;  Location: [place] [facility];  Service: Cardiovascular;  Laterality: Left;  . LYMPH NODE DISSECTION Left 04/16/2019   Procedure: Left inguinal Lymph Node Dissection;  Surgeon: [name], MD;  Location: [place];  Service: General;  Laterality: Left;  Marland Kitchen MELANOMA EXCISION Left 04/16/2019   Procedure: MELANOMA EXCISION LEFT GROIN MASS;  Surgeon: [name], MD;  Location: [place];  Service: General;  Laterality: Left;  Marland Kitchen MELANOMA EXCISION WITH SENTINEL LYMPH NODE BIOPSY Left 2012   Left calf   . PACEMAKER INSERTION N/A 08/26/2018   Procedure: INSERTION PACEMAKER;  Surgeon: [name], MD;  Location: [facility] ORS;  Service: Cardiovascular;  Laterality: N/A;  . TEMPORARY PACEMAKER N/A 08/25/2018   Procedure: TEMPORARY PACEMAKER;  Surgeon: [name], MD;  Location: [place] [facility];  Service: Cardiovascular;  Laterality: N/A;    There were no vitals filed for this visit.  Subjective Assessment - 07/28/19 1416    Subjective   Edward Pitts presents to OT for CDT to LLE  lymphedema 2/2 melanoma Rx. Pt reports 8/10 knee pain and 0 pain inLLE  today. Pt forgot LE measures issued last visit. He will complete and return next time.    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 with surgical excision w/ LN disection (-7/7 LN); Completed adjuvant XRT 07/07/2019; 08/2018 Pacemaker placed; L knee sx date?;    Limitations  chronic LLE knee pain; post surgical LLE pain,  chronic LLE/LLQ swelling, decreased LLE strength, impaired gait, decreased dynamic balance, altered sensation LLE, increased risk of infection, decreased standing and walking tolerance > 1 hr; decreased ankle AROM    Repetition  Increases Symptoms    Special Tests  +Stemmer base L toes    Patient Stated Goals  reduce swelling to normal and keep it from getting worse    Pain Onset  Other (comment)   post surgical pain onset 1/21; L knee pain chronic         LYMPHEDEMA/ONCOLOGY QUESTIONNAIRE - 07/28/19 1727      Right Lower Extremity Lymphedema   Other  RLE A-D volume =2932.21 ml. RLE E-G volume = 6312 ml. RLE A-G volume = 8945.16 ml.    Other  Limb volume differential (LVD) for A-D segment measures 34.75%, L>R, ffor E-G segment = 9.18%, and for full leg (A-G) measures 21.84%, L>R              OT Treatments/Exercises (OP) - 07/28/19 0001      ADLs   ADL  Education Given  Yes      Manual Therapy   Manual Therapy  Edema management;Compression Bandaging    Manual therapy comments  BLE comparative limb volumetrics    Compression Bandaging  Day 1 wraps are knee length only using 3 layers of short stretch wraps, 8cm, 10 cm and 12 cm wide x 5 m, in circumferential gradient configuration over 0.4 cm Rosidal foam and stockinett.                   OT Long Term Goals - 07/24/19 1729      OT LONG TERM GOAL #1   Title  Pt will be able to apply thigh length, multi-layer, short stretch compression wraps daily to single limb using correct gradient  techniques with modified independence (extra time) to achieve optimal limb volume reduction, to return affected limb/s, as closely as possible, to premorbid size and shape, to limit infection risk, and to improve safe functional ambulation and mobility.    Baseline  Max A    Time  4    Period  Days    Status  New    Target Date  --   4th OT Rx visit     OT LONG TERM GOAL #2   Title  Pt will be able to verbalize signs and symptoms of cellulitis infection and identify at least 4 common lymphedema precautions using printed resource for reference to limit LE progression over time to limit risk of infection and LE exacerbation.    Baseline  Max A    Time  4    Period  Days    Target Date  --   4th OT Rx visit     OT LONG TERM GOAL #3   Title  Pt will tolerate thigh length, multilayer short stretch compression wrap to single leg up to 23/ 7 to achieve optimal swelling reduction to arrest lymphedema progression and in preparation for fitting appropriate compression garments/ devices.    Baseline  Max A    Time  12    Period  Weeks    Status  New    Target Date  10/22/19      OT LONG TERM GOAL #4   Title  Pt will achieve 10% or greater limb volume reduction to LLE from toes to groin to enable functional improvements including fitting into preferred footwear and clothing, decreased infection risk and improved body image.    Baseline  Max A    Time  12    Period  Weeks    Status  New    Target Date  10/22/19      OT LONG TERM GOAL #5   Title  Pt will achieve and sustain at least 85%  compliance with all daily LE self-care home program components, to include skin care, lymphatic pumping therex, compression wraps and garments/devices, and simple self-MLD, to reduce and control limb swelling, reduce infection risk and limit LE progression.    Baseline  Max A    Time  12    Period  Weeks    Status  New    Target Date  10/22/19      Long Term Additional Goals   Additional Long Term Goals   Yes      OT LONG TERM GOAL #6   Title  Pt will achieve modified independence ( extra time, assistive device) with donning and doffing day and night time compression garments and devices to limit re-accumulation of lymphatic congestion  and progression of lymphedema.    Baseline  Max A    Time  12    Period  Weeks    Status  New    Target Date  10/22/19            Plan - 07/28/19 1719    Clinical Impression Statement  Completed initial BLE comparative limb volumetrics. lIMB VOLUME DIFFERENTIAL FOR THE a-d SEGMENT (ANKLE TO TIBIAL TUBEROSITY) MEASURES 34.57%, l>r. Limb volume for E-G segment (THIGH: knee to groin) = 9.18%, L>R. And total leg volume   , A-G, (ankle to groin) measures 21.8%, L>R. Typically the dominant leg, in this case the RLE, measures 2-3% larger than the non-dominant limb overall , unless environmental issues affect mass otherwise. Essentially what these values reveal is LLE is swollen entirely with greatest concentration of dense, pitting edema in the leg vs thigh.Pt tolerated knee length , multilater compression wrap without difficulty today. Pt able to don existing shoe after relacing. Pt instructed to attempt to tolerate 23 hours, but to remove if this is not possible. Cobnt as per POC.    OT Occupational Profile and History  Comprehensive Assessment- Review of records and extensive additional review of physical, cognitive, psychosocial history related to current functional performance    Occupational performance deficits (Please refer to evaluation for details):  ADL's;IADL's;Rest and Sleep;Work;Leisure;Social Participation;Other   body image, role performance   Body Structure / Function / Physical Skills  ADL;ROM;Scar mobility;IADL;Edema;Balance;Sensation;Skin integrity;Mobility;Flexibility;Strength;Decreased knowledge of precautions;Gait;Pain;Decreased knowledge of use of DME    Rehab Potential  Good    Clinical Decision Making  Several treatment options, min-mod task  modification necessary    Comorbidities Affecting Occupational Performance:  Presence of comorbidities impacting occupational performance    Modification or Assistance to Complete Evaluation   Min-Moderate modification of tasks or assist with assess necessary to complete eval    OT Frequency  2x / week    OT Duration  12 weeks   and PRN   OT Treatment/Interventions  Self-care/ADL training;Therapeutic exercise;Functional Mobility Training;Manual Therapy;Coping strategies training;Therapeutic activities;Manual lymph drainage;Energy conservation;DME and/or AE instruction;Compression bandaging;Other (comment);Scar mobilization;Patient/family education   fit with appropriate compression garments once swelling is reduced   Plan  Complete Decongestive Therapy (CDT), Intensive and Management Phases to include  manual lymphatyic drainage (MLD), skin care, therapeutic exercise, compression with short stretch wraps and garments, Pt edu for LE sef care    Consulted and Agree with Plan of Care  Patient       Patient will benefit from skilled therapeutic intervention in order to improve the following deficits and impairments:   Body Structure / Function / Physical Skills: ADL, ROM, Scar mobility, IADL, Edema, Balance, Sensation, Skin integrity, Mobility, Flexibility, Strength, Decreased knowledge of precautions, Gait, Pain, Decreased knowledge of use of DME       Visit Diagnosis: Lymphedema, not elsewhere classified    Problem List Patient Active Problem List   Diagnosis Date Noted  . Malignant melanoma of overlapping sites ([place]) 04/23/2019  . Goals of care, counseling/discussion 04/23/2019  . 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 Near syncope 12/17/2018  . AKI (acute kidney injury) ([place]) 08/24/2018  . Acute hyperkalemia 08/24/2018  . HTN (hypertension) 08/24/2018  . HLD (hyperlipidemia) 08/24/2018  . Seizures ([place]) 08/24/2018  . Complete heart block ([place]) 08/24/2018   . Stable angina ([place]) 06/27/2017    Andrey Spearman, MS, OTR/L, CLT-LANA 07/28/19 5:30 PM  [place] [facility] SERVICES [address]  [place], [place], [address] Phone: [phone]   Fax:  [phone]  Name: Edward Pitts MRN: [id number] Date of Birth: 10-Jul-1960

## 2019-07-29 ENCOUNTER — Ambulatory Visit: Payer: BC Managed Care – PPO | Admitting: Physical Therapy

## 2019-07-29 ENCOUNTER — Encounter: Payer: Self-pay | Admitting: Physical Therapy

## 2019-07-29 ENCOUNTER — Other Ambulatory Visit: Payer: Self-pay

## 2019-07-29 DIAGNOSIS — R262 Difficulty in walking, not elsewhere classified: Secondary | ICD-10-CM

## 2019-07-29 DIAGNOSIS — I890 Lymphedema, not elsewhere classified: Secondary | ICD-10-CM | POA: Diagnosis not present

## 2019-07-29 DIAGNOSIS — M6281 Muscle weakness (generalized): Secondary | ICD-10-CM

## 2019-07-29 NOTE — Therapy (Addendum)
[place] MAIN [facility] SERVICES [address] [place], [place], [address] Phone: [phone]   Fax:  [phone]  Jul 29, 2019   HNOID [id number]  Physical Therapy Discharge Summary   Patient: Madden Cavin  MRN: [id number]  Date of Birth: 02/16/1961   Diagnosis: Lymphedema, not elsewhere classified  Muscle weakness (generalized)  Difficulty in walking, not elsewhere classified Referring Provider (PT): [name]   The above patient had been seen in Physical Therapy  6 times of 6 treatments scheduled with 0 no shows and 0 cancellations.  The treatment consisted of strengthening of BLE  The patient is: improved  Subjective: Patient is feeling better and is able to work in his yard.   Discharge Findings: Progress towards all goals .   Functional Status at Discharge: Independent with transfers and ambulation without AD  FOTO score 44 and ABC scale is 66% 6 MW 1100 ft Ascending and descending steps with rail and step over step  Plan - 07/29/19 1313    Clinical Impression Statement  Patient has demonstrated improved dynamic balance, gait, transfers, mobility, LE strength, UE strength, decreased pain and has made progress with goals # 1,2,3,.Patient will be discharged from skilled physical therapy to HEP.   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 November 2020    Examination-Activity Limitations  Locomotion Level;Squat;Stairs;Stand;Transfers    Examination-Participation Restrictions  Cleaning;Community Activity;Shop;Volunteer;Yard Work    [employer]  Evolving/Moderate complexity    Rehab Potential  Good    PT Frequency  1x / week    PT Duration  8 weeks    PT Treatment/Interventions   Cryotherapy;Moist Heat;Gait training;Stair training;Functional mobility training;Therapeutic activities;Therapeutic exercise;Balance training;Neuromuscular re-education;Patient/family education;Energy conservation    PT Next Visit Plan  initiate HEP for LE strengthening    PT Home Exercise Plan  will address next session;    Consulted and Agree with Plan of Care  Patient       Sincerely,   [place] Basher, Sherryl Barters, PT, PT DPT   CC No Recipients  [place] [address] [place], [place], [address] Phone: [phone]   Fax:  [phone]  Patient: Noland Lanigan  MRN: [id number]  Date of Birth: September 29, 1960

## 2019-07-30 ENCOUNTER — Ambulatory Visit: Payer: BC Managed Care – PPO | Admitting: Occupational Therapy

## 2019-07-30 ENCOUNTER — Other Ambulatory Visit: Payer: Self-pay

## 2019-07-30 DIAGNOSIS — I890 Lymphedema, not elsewhere classified: Secondary | ICD-10-CM

## 2019-07-30 NOTE — Progress Notes (Signed)

## 2019-07-30 NOTE — Therapy (Signed)
[place] MAIN [facility] SERVICES [address] [place], [place], [address] Phone: [phone]   Fax:  [phone]  Occupational Therapy Treatment  Patient Details  Name: Edward Pitts MRN: [id number] Date of Birth: 1960-11-10 Referring Provider (OT): [name], MD   Encounter Date: 07/30/2019  OT End of Session - 07/30/19 1414    Visit Number  3    Date for OT Re-Evaluation  10/22/19    OT Start Time  0203    OT Stop Time  0308    OT Time Calculation (min)  65 min       Past Medical History:  Diagnosis Date  . Anxiety   . Arthritis   . Complication of anesthesia   . Family history of adverse reaction to anesthesia    PONV mother  . Hyperlipidemia   . Hypertension   . Melanoma ([place]) 2012  . PONV (postoperative nausea and vomiting) 04/16/2019  . Presence of permanent cardiac pacemaker    Medtronic  . Seizures ([place])     Past Surgical History:  Procedure Laterality Date  . KNEE SURGERY Left   . LEFT HEART CATH AND CORONARY ANGIOGRAPHY Left 06/29/2017   Procedure: LEFT HEART CATH AND CORONARY ANGIOGRAPHY;  Surgeon: [name], MD;  Location: [place] [facility];  Service: Cardiovascular;  Laterality: Left;  . LYMPH NODE DISSECTION Left 04/16/2019   Procedure: Left inguinal Lymph Node Dissection;  Surgeon: [name], MD;  Location: [place];  Service: General;  Laterality: Left;  Marland Kitchen MELANOMA EXCISION Left 04/16/2019   Procedure: MELANOMA EXCISION LEFT GROIN MASS;  Surgeon: [name], MD;  Location: [place];  Service: General;  Laterality: Left;  Marland Kitchen MELANOMA EXCISION WITH SENTINEL LYMPH NODE BIOPSY Left 2012   Left calf   . PACEMAKER INSERTION N/A 08/26/2018   Procedure: INSERTION PACEMAKER;  Surgeon: [name], MD;  Location: [facility]S;  Service: Cardiovascular;  Laterality: N/A;  . TEMPORARY PACEMAKER N/A 08/25/2018   Procedure: TEMPORARY PACEMAKER;  Surgeon: [name], MD;  Location: [place] [facility];  Service:  Cardiovascular;  Laterality: N/A;    There were no vitals filed for this visit.  Subjective Assessment - 07/30/19 1411    Subjective   Edward Pitts presents to OT for visit 3/36 to address LLE lymphedema 2/2 melanoma Rx. Pt reports knee and leg pain "is about the same, 8/10". Pt is accompanied by his lovely wife, Edward Pitts who is here to learn gradient compression wrapping.    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 with surgical excision w/ LN disection (-7/7 LN); Completed adjuvant XRT 07/07/2019; 08/2018 Pacemaker placed; L knee sx date?;    Limitations  chronic LLE knee pain; post surgical LLE pain,  chronic LLE/LLQ swelling, decreased LLE strength, impaired gait, decreased dynamic balance, altered sensation LLE, increased risk of infection, decreased standing and walking tolerance > 1 hr; decreased ankle AROM    Repetition  Increases Symptoms    Special Tests  +Stemmer base L toes    Patient Stated Goals  reduce swelling to normal and keep it from getting worse    Pain Onset  Other (comment)   post surgical pain onset 1/21; L knee pain chronic                  OT Treatments/Exercises (OP) - 07/30/19 0001      ADLs   ADL Education Given  Yes      Manual  Therapy   Manual Therapy  Edema management;Compression Bandaging    Manual therapy comments  --    Compression Bandaging  LLE thigh length compression wraps from toes to groin with care taken not to shear or abraid skin at medical   thigh/ groin in XRT field.             OT Education - 07/30/19 1628    Education Details  Pt and spouse education for gradient compression bandaging using short stretch compression wraps- thigh length today    Person(s) Educated  Patient;Spouse    Methods  Explanation;Demonstration;Handout;Tactile cues;Verbal cues    Comprehension  Verbalized understanding;Returned demonstration;Need further instruction;Verbal cues required;Tactile cues required           OT Long Term Goals - 07/24/19 1729      OT LONG TERM GOAL #1   Title  Pt will be able to apply thigh length, multi-layer, short stretch compression wraps daily to single limb using correct gradient techniques with modified independence (extra time) to achieve optimal limb volume reduction, to return affected limb/s, as closely as possible, to premorbid size and shape, to limit infection risk, and to improve safe functional ambulation and mobility.    Baseline  Max A    Time  4    Period  Days    Status  New    Target Date  --   4th OT Rx visit     OT LONG TERM GOAL #2   Title  Pt will be able to verbalize signs and symptoms of cellulitis infection and identify at least 4 common lymphedema precautions using printed resource for reference to limit LE progression over time to limit risk of infection and LE exacerbation.    Baseline  Max A    Time  4    Period  Days    Target Date  --   4th OT Rx visit     OT LONG TERM GOAL #3   Title  Pt will tolerate thigh length, multilayer short stretch compression wrap to single leg up to 23/ 7 to achieve optimal swelling reduction to arrest lymphedema progression and in preparation for fitting appropriate compression garments/ devices.    Baseline  Max A    Time  12    Period  Weeks    Status  New    Target Date  10/22/19      OT LONG TERM GOAL #4   Title  Pt will achieve 10% or greater limb volume reduction to LLE from toes to groin to enable functional improvements including fitting into preferred footwear and clothing, decreased infection risk and improved body image.    Baseline  Max A    Time  12    Period  Weeks    Status  New    Target Date  10/22/19      OT LONG TERM GOAL #5   Title  Pt will achieve and sustain at least 85%  compliance with all daily LE self-care home program components, to include skin care, lymphatic pumping therex, compression wraps and garments/devices, and simple self-MLD, to reduce and control limb  swelling, reduce infection risk and limit LE progression.    Baseline  Max A    Time  12    Period  Weeks    Status  New    Target Date  10/22/19      Long Term Additional Goals   Additional Long Term Goals  Yes  OT LONG TERM GOAL #6   Title  Pt will achieve modified independence ( extra time, assistive device) with donning and doffing day and night time compression garments and devices to limit re-accumulation of lymphatic congestion and progression of lymphedema.    Baseline  Max A    Time  12    Period  Weeks    Status  New    Target Date  10/22/19            Plan - 07/30/19 1630    Clinical Impression Statement  Emphasis of visit on Pt and spouse edu for gradient compression wrapping . Despite not speaking fluent English and having no interpreter, Edward Pitts is a quick study and demonstrates proclivity for wraps application using correct techniques. By end of session, after one opportunity to apply full thigh length gradient wraps, spouse is able to apply wraps with min A. She will attend visit next time for additional instruction to perfect her techniques.Cont as per POC.    OT Occupational Profile and History  Comprehensive Assessment- Review of records and extensive additional review of physical, cognitive, psychosocial history related to current functional performance    Occupational performance deficits (Please refer to evaluation for details):  ADL's;IADL's;Rest and Sleep;Work;Leisure;Social Participation;Other   body image, role performance   Body Structure / Function / Physical Skills  ADL;ROM;Scar mobility;IADL;Edema;Balance;Sensation;Skin integrity;Mobility;Flexibility;Strength;Decreased knowledge of precautions;Gait;Pain;Decreased knowledge of use of DME    Rehab Potential  Good    Clinical Decision Making  Several treatment options, min-mod task modification necessary    Comorbidities Affecting Occupational Performance:  Presence of comorbidities impacting occupational  performance    Modification or Assistance to Complete Evaluation   Min-Moderate modification of tasks or assist with assess necessary to complete eval    OT Frequency  2x / week    OT Duration  12 weeks   and PRN   OT Treatment/Interventions  Self-care/ADL training;Therapeutic exercise;Functional Mobility Training;Manual Therapy;Coping strategies training;Therapeutic activities;Manual lymph drainage;Energy conservation;DME and/or AE instruction;Compression bandaging;Other (comment);Scar mobilization;Patient/family education   fit with appropriate compression garments once swelling is reduced   Plan  Complete Decongestive Therapy (CDT), Intensive and Management Phases to include  manual lymphatyic drainage (MLD), skin care, therapeutic exercise, compression with short stretch wraps and garments, Pt edu for LE sef care    Consulted and Agree with Plan of Care  Patient       Patient will benefit from skilled therapeutic intervention in order to improve the following deficits and impairments:   Body Structure / Function / Physical Skills: ADL, ROM, Scar mobility, IADL, Edema, Balance, Sensation, Skin integrity, Mobility, Flexibility, Strength, Decreased knowledge of precautions, Gait, Pain, Decreased knowledge of use of DME       Visit Diagnosis: No diagnosis found.    Problem List Patient Active Problem List   Diagnosis Date Noted  . Malignant melanoma of overlapping sites ([place]) 04/23/2019  . Goals of care, counseling/discussion 04/23/2019  . 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 Near syncope 12/17/2018  . AKI (acute kidney injury) ([place]) 08/24/2018  . Acute hyperkalemia 08/24/2018  . HTN (hypertension) 08/24/2018  . HLD (hyperlipidemia) 08/24/2018  . Seizures ([place]) 08/24/2018  . Complete heart block ([place]) 08/24/2018  . Stable angina ([facility]) 06/27/2017    Andrey Spearman, MS, OTR/L, [facility] 07/30/19 4:33 PM  [place]  MAIN [facility] SERVICES [address] [place], [place], [address] Phone: [phone]   Fax:  [phone]  Name: Edward Pitts MRN: [id number]  Date of Birth: Dec 07, 1960

## 2019-07-31 ENCOUNTER — Ambulatory Visit: Payer: BC Managed Care – PPO | Admitting: Occupational Therapy

## 2019-07-31 ENCOUNTER — Other Ambulatory Visit: Payer: Self-pay

## 2019-07-31 ENCOUNTER — Encounter: Payer: BC Managed Care – PPO | Admitting: Physical Therapy

## 2019-07-31 DIAGNOSIS — I890 Lymphedema, not elsewhere classified: Secondary | ICD-10-CM

## 2019-07-31 NOTE — Therapy (Signed)
[place] MAIN [facility] SERVICES [address] [place], [place], [address] Phone: [phone]   Fax:  [phone]  Occupational Therapy Treatment  Patient Details  Name: Edward Pitts MRN: [id number] Date of Birth: [birth year] Referring Provider (OT): [name], MD   Encounter Date: 07/31/2019  OT End of Session - 07/31/19 1628    Visit Number  4    Date for OT Re-Evaluation  10/22/19       Past Medical History:  Diagnosis Date  . Anxiety   . Arthritis   . Complication of anesthesia   . Family history of adverse reaction to anesthesia    PONV mother  . Hyperlipidemia   . Hypertension   . Melanoma ([place]) 2012  . PONV (postoperative nausea and vomiting) 04/16/2019  . Presence of permanent cardiac pacemaker    Medtronic  . Seizures ([place])     Past Surgical History:  Procedure Laterality Date  . KNEE SURGERY Left   . LEFT HEART CATH AND CORONARY ANGIOGRAPHY Left 06/29/2017   Procedure: LEFT HEART CATH AND CORONARY ANGIOGRAPHY;  Surgeon: [name], MD;  Location: [place] [facility];  Service: Cardiovascular;  Laterality: Left;  . LYMPH NODE DISSECTION Left 04/16/2019   Procedure: Left inguinal Lymph Node Dissection;  Surgeon: [name], MD;  Location: [place];  Service: General;  Laterality: Left;  Marland Kitchen MELANOMA EXCISION Left 04/16/2019   Procedure: MELANOMA EXCISION LEFT GROIN MASS;  Surgeon: [name], MD;  Location: [place];  Service: General;  Laterality: Left;  Marland Kitchen MELANOMA EXCISION WITH SENTINEL LYMPH NODE BIOPSY Left 2012   Left calf   . PACEMAKER INSERTION N/A 08/26/2018   Procedure: INSERTION PACEMAKER;  Surgeon: [name], MD;  Location: [facility];  Service: Cardiovascular;  Laterality: N/A;  . TEMPORARY PACEMAKER N/A 08/25/2018   Procedure: TEMPORARY PACEMAKER;  Surgeon: [name], MD;  Location: [place] [facility];  Service: Cardiovascular;  Laterality: N/A;    There were no vitals filed for this  visit.  Subjective Assessment - 07/31/19 1414    Subjective   Edward Pitts presents to OT for visit 4/36 to address LLE lymphedema 2/2 melanoma Rx. Pt presents with compression wraps applied at yesterday's visit in place. 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 with surgical excision w/ LN disection (-7/7 LN); Completed adjuvant XRT 07/07/2019; 08/2018 Pacemaker placed; L knee sx date?;    Limitations  chronic LLE knee pain; post surgical LLE pain,  chronic LLE/LLQ swelling, decreased LLE strength, impaired gait, decreased dynamic balance, altered sensation LLE, increased risk of infection, decreased standing and walking tolerance > 1 hr; decreased ankle AROM    Repetition  Increases Symptoms    Special Tests  +Stemmer base L toes    Patient Stated Goals  reduce swelling to normal and keep it from getting worse    Pain Onset  Other (comment)   post surgical pain onset 1/21; L knee pain chronic                  OT Treatments/Exercises (OP) - 07/31/19 0001      ADLs   ADL Education Given  Yes      Manual Therapy   Manual Therapy  Edema management;Compression Bandaging;Manual Lymphatic Drainage (MLD)    Manual Lymphatic Drainage (MLD)  MLDto LLE/LLQ in supine and sidelying utilizing short neck sequence, deep abdominals with diaphragmatic breathing,  ipsilateral inguinal -axiillary anastomosis and proximal to distal leg sequence.     Compression Bandaging  LLE thigh length compression wraps from toes to groin with care taken not to shear or abraid skin at medical   thigh/ groin in XRT field.             OT Education - 07/31/19 1627    Education Details  Pt edu for into level simple self MLD utilizing adjacent watersheds. Pt verbalized understanding of rational and lymphatic map    Person(s) Educated  Patient;Spouse    Methods  Explanation;Demonstration;Handout;Tactile cues;Verbal cues     Comprehension  Verbalized understanding;Returned demonstration;Need further instruction;Verbal cues required;Tactile cues required          OT Long Term Goals - 07/24/19 1729      OT LONG TERM GOAL #1   Title  Pt will be able to apply thigh length, multi-layer, short stretch compression wraps daily to single limb using correct gradient techniques with modified independence (extra time) to achieve optimal limb volume reduction, to return affected limb/s, as closely as possible, to premorbid size and shape, to limit infection risk, and to improve safe functional ambulation and mobility.    Baseline  Max A    Time  4    Period  Days    Status  New    Target Date  --   4th OT Rx visit     OT LONG TERM GOAL #2   Title  Pt will be able to verbalize signs and symptoms of cellulitis infection and identify at least 4 common lymphedema precautions using printed resource for reference to limit LE progression over time to limit risk of infection and LE exacerbation.    Baseline  Max A    Time  4    Period  Days    Target Date  --   4th OT Rx visit     OT LONG TERM GOAL #3   Title  Pt will tolerate thigh length, multilayer short stretch compression wrap to single leg up to 23/ 7 to achieve optimal swelling reduction to arrest lymphedema progression and in preparation for fitting appropriate compression garments/ devices.    Baseline  Max A    Time  12    Period  Weeks    Status  New    Target Date  10/22/19      OT LONG TERM GOAL #4   Title  Pt will achieve 10% or greater limb volume reduction to LLE from toes to groin to enable functional improvements including fitting into preferred footwear and clothing, decreased infection risk and improved body image.    Baseline  Max A    Time  12    Period  Weeks    Status  New    Target Date  10/22/19      OT LONG TERM GOAL #5   Title  Pt will achieve and sustain at least 85%  compliance with all daily LE self-care home program components, to  include skin care, lymphatic pumping therex, compression wraps and garments/devices, and simple self-MLD, to reduce and control limb swelling, reduce infection risk and limit LE progression.    Baseline  Max A    Time  12    Period  Weeks    Status  New    Target Date  10/22/19      Long Term Additional Goals   Additional Long Term Goals  Yes      OT LONG  TERM GOAL #6   Title  Pt will achieve modified independence ( extra time, assistive device) with donning and doffing day and night time compression garments and devices to limit re-accumulation of lymphatic congestion and progression of lymphedema.    Baseline  Max A    Time  12    Period  Weeks    Status  New    Target Date  10/22/19            Plan - 07/31/19 1629    Clinical Impression Statement  Pt tolerating compression wraps without difficulty. He was able to leave thigh length in place for last 24 hours. Pt tolerated LLE/LLQ MLD without increased pain. Pt demnostrates good return for LE self-care edu-ongoing. Cont as per POC.    OT Occupational Profile and History  Comprehensive Assessment- Review of records and extensive additional review of physical, cognitive, psychosocial history related to current functional performance    Occupational performance deficits (Please refer to evaluation for details):  ADL's;IADL's;Rest and Sleep;Work;Leisure;Social Participation;Other   body image, role performance   Body Structure / Function / Physical Skills  ADL;ROM;Scar mobility;IADL;Edema;Balance;Sensation;Skin integrity;Mobility;Flexibility;Strength;Decreased knowledge of precautions;Gait;Pain;Decreased knowledge of use of DME    Rehab Potential  Good    Clinical Decision Making  Several treatment options, min-mod task modification necessary    Comorbidities Affecting Occupational Performance:  Presence of comorbidities impacting occupational performance    Modification or Assistance to Complete Evaluation   Min-Moderate modification  of tasks or assist with assess necessary to complete eval    OT Frequency  2x / week    OT Duration  12 weeks   and PRN   OT Treatment/Interventions  Self-care/ADL training;Therapeutic exercise;Functional Mobility Training;Manual Therapy;Coping strategies training;Therapeutic activities;Manual lymph drainage;Energy conservation;DME and/or AE instruction;Compression bandaging;Other (comment);Scar mobilization;Patient/family education   fit with appropriate compression garments once swelling is reduced   Plan  Complete Decongestive Therapy (CDT), Intensive and Management Phases to include  manual lymphatyic drainage (MLD), skin care, therapeutic exercise, compression with short stretch wraps and garments, Pt edu for LE sef care    Consulted and Agree with Plan of Care  Patient       Patient will benefit from skilled therapeutic intervention in order to improve the following deficits and impairments:   Body Structure / Function / Physical Skills: ADL, ROM, Scar mobility, IADL, Edema, Balance, Sensation, Skin integrity, Mobility, Flexibility, Strength, Decreased knowledge of precautions, Gait, Pain, Decreased knowledge of use of DME       Visit Diagnosis: Lymphedema, not elsewhere classified    Problem List Patient Active Problem List   Diagnosis Date Noted  . Malignant melanoma of overlapping sites ([place]) 04/23/2019  . Goals of care, counseling/discussion 04/23/2019  . 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 Near syncope 12/17/2018  . AKI (acute kidney injury) ([place]) 08/24/2018  . Acute hyperkalemia 08/24/2018  . HTN (hypertension) 08/24/2018  . HLD (hyperlipidemia) 08/24/2018  . Seizures ([place]) 08/24/2018  . Complete heart block ([place]) 08/24/2018  . Stable angina ([facility]) 06/27/2017    Andrey Spearman, MS, OTR/L, [facility] 07/31/19 4:31 PM  [place] MAIN [facility] SERVICES [address] [place], [place], [address] Phone:  [phone]   Fax:  [phone]  Name: Terric Korte MRN: [id number] Date of Birth: [birth year]

## 2019-08-05 ENCOUNTER — Encounter: Payer: BC Managed Care – PPO | Admitting: Physical Therapy

## 2019-08-06 ENCOUNTER — Encounter: Payer: Self-pay | Admitting: Oncology

## 2019-08-06 ENCOUNTER — Inpatient Hospital Stay (HOSPITAL_BASED_OUTPATIENT_CLINIC_OR_DEPARTMENT_OTHER): Payer: BC Managed Care – PPO | Admitting: Oncology

## 2019-08-06 ENCOUNTER — Other Ambulatory Visit: Payer: Self-pay

## 2019-08-06 ENCOUNTER — Ambulatory Visit: Payer: BC Managed Care – PPO | Admitting: Radiation Oncology

## 2019-08-06 ENCOUNTER — Inpatient Hospital Stay: Payer: BC Managed Care – PPO

## 2019-08-06 ENCOUNTER — Inpatient Hospital Stay: Payer: BC Managed Care – PPO | Attending: Oncology

## 2019-08-06 ENCOUNTER — Ambulatory Visit: Payer: BC Managed Care – PPO | Admitting: Occupational Therapy

## 2019-08-06 VITALS — BP 149/76 | HR 69 | Temp 96.2°F | Resp 18 | Wt 225.8 lb

## 2019-08-06 DIAGNOSIS — Z923 Personal history of irradiation: Secondary | ICD-10-CM | POA: Diagnosis not present

## 2019-08-06 DIAGNOSIS — C438 Malignant melanoma of overlapping sites of skin: Secondary | ICD-10-CM

## 2019-08-06 DIAGNOSIS — I890 Lymphedema, not elsewhere classified: Secondary | ICD-10-CM | POA: Diagnosis not present

## 2019-08-06 DIAGNOSIS — D649 Anemia, unspecified: Secondary | ICD-10-CM | POA: Insufficient documentation

## 2019-08-06 DIAGNOSIS — Z5112 Encounter for antineoplastic immunotherapy: Secondary | ICD-10-CM | POA: Diagnosis not present

## 2019-08-06 DIAGNOSIS — L80 Vitiligo: Secondary | ICD-10-CM | POA: Diagnosis not present

## 2019-08-06 DIAGNOSIS — L539 Erythematous condition, unspecified: Secondary | ICD-10-CM | POA: Insufficient documentation

## 2019-08-06 DIAGNOSIS — Z85820 Personal history of malignant melanoma of skin: Secondary | ICD-10-CM | POA: Diagnosis not present

## 2019-08-06 DIAGNOSIS — K769 Liver disease, unspecified: Secondary | ICD-10-CM | POA: Insufficient documentation

## 2019-08-06 DIAGNOSIS — Z809 Family history of malignant neoplasm, unspecified: Secondary | ICD-10-CM | POA: Insufficient documentation

## 2019-08-06 DIAGNOSIS — C4359 Malignant melanoma of other part of trunk: Secondary | ICD-10-CM | POA: Diagnosis present

## 2019-08-06 LAB — COMPREHENSIVE METABOLIC PANEL
ALT: 35 U/L (ref 0–44)
AST: 26 U/L (ref 15–41)
Albumin: 4.4 g/dL (ref 3.5–5.0)
Alkaline Phosphatase: 91 U/L (ref 38–126)
Anion gap: 7 (ref 5–15)
BUN: 19 mg/dL (ref 6–20)
CO2: 27 mmol/L (ref 22–32)
Calcium: 9.3 mg/dL (ref 8.9–10.3)
Chloride: 106 mmol/L (ref 98–111)
Creatinine, Ser: 1.15 mg/dL (ref 0.61–1.24)
GFR calc Af Amer: >60 mL/min (ref >60)
GFR calc non Af Amer: >60 mL/min (ref >60)
Glucose, Bld: 107 mg/dL — ABNORMAL HIGH (ref 70–99)
Potassium: 4.5 mmol/L (ref 3.5–5.1)
Sodium: 140 mmol/L (ref 135–145)
Total Bilirubin: 0.6 mg/dL (ref 0.3–1.2)
Total Protein: 7.4 g/dL (ref 6.5–8.1)

## 2019-08-06 LAB — CBC WITH DIFFERENTIAL/PLATELET
Abs Immature Granulocytes: 0.03 10*3/uL (ref 0.00–0.07)
Basophils Absolute: 0 10*3/uL (ref 0.0–0.1)
Basophils Relative: 1 %
Eosinophils Absolute: 0.3 10*3/uL (ref 0.0–0.5)
Eosinophils Relative: 7 %
HCT: 35 % — ABNORMAL LOW (ref 39.0–52.0)
Hemoglobin: 11.9 g/dL — ABNORMAL LOW (ref 13.0–17.0)
Immature Granulocytes: 1 %
Lymphocytes Relative: 15 %
Lymphs Abs: 0.6 10*3/uL — ABNORMAL LOW (ref 0.7–4.0)
MCH: 29.5 pg (ref 26.0–34.0)
MCHC: 34 g/dL (ref 30.0–36.0)
MCV: 86.8 fL (ref 80.0–100.0)
Monocytes Absolute: 0.4 10*3/uL (ref 0.1–1.0)
Monocytes Relative: 10 %
Neutro Abs: 2.5 10*3/uL (ref 1.7–7.7)
Neutrophils Relative %: 66 %
Platelets: 192 10*3/uL (ref 150–400)
RBC: 4.03 MIL/uL — ABNORMAL LOW (ref 4.22–5.81)
RDW: 13.3 % (ref 11.5–15.5)
WBC: 3.8 10*3/uL — ABNORMAL LOW (ref 4.0–10.5)
nRBC: 0 % (ref 0.0–0.2)

## 2019-08-06 MED ORDER — SODIUM CHLORIDE 0.9 % IV SOLN
Freq: Once | INTRAVENOUS | Status: AC
  Filled 2019-08-06: qty 250

## 2019-08-06 MED ORDER — SODIUM CHLORIDE 0.9 % IV SOLN
240.0000 mg | Freq: Once | INTRAVENOUS | Status: AC
  Administered 2019-08-06: 240 mg via INTRAVENOUS
  Filled 2019-08-06: qty 24

## 2019-08-06 NOTE — Progress Notes (Signed)
Patient denies problems/concerns today.   

## 2019-08-06 NOTE — Progress Notes (Signed)
Hematology/Oncology follow up  note [facility] Telephone:([phone] Fax:([phone]) [phone]   Patient Care Team: [name], [name], MD as PCP - General (Student)  REFERRING PROVIDER: Cherylann Parr, MD  CHIEF COMPLAINTS/REASON FOR VISIT:  Follow up for melanoma HISTORY OF PRESENTING ILLNESS:   Edward Pitts is a  59 y.o.  male with PMH listed below was seen in consultation at the request of  [name], [name], MD  for evaluation of inguinal mass Patient presented to emergency room 3 days ago complaining about left inguinal mass discomfort. Reports that he has really noticed the mass growing for the past 1 months. He has a history of left lower extremity melanoma in 2011, status post local excision.  Pain was increased with squatting of laxation. He was advised to take Tylenol for pain. Denies any fever, chills, night sweating.  He does feel mild nauseated. Appetite is fair.  He has lost about 10 pounds since earlier this year. 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  Left inguinal and left iliac nodes which are suspicious for involvement.  There are also 2 small nonspecific hypodense lesions within the right liver, nonspecific.  # patient underwent left groin mass resection On 04/16/2019. Resection pathology showed malignant melanoma, replacing a lymph node, with extracapsular extension, peripheral and deep margins involved.  Left inguinal contents, all 7 lymph nodes were negative for melanoma in the lymph nodes.  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  # PDL1 80% TPS  # 04/16/2019. underwent left groin mass resection   07/07/2019  Status post adjuvant radiation and finished radiation   INTERVAL HISTORY Edward Pitts is a 59 y.o. male who has above history reviewed by me today presents for follow up visit for management of  inguinal nodal recurrence of melanoma Problems and complaints are listed below: Patient reports that the skin irritation and erythema at the site of radiation has significantly improved. Denies any fever, chills, nausea, vomiting, diarrhea He has noticed more skin hypopigmentation dots on his upper extremities. He has baseline skin hypopigmentation on bilateral dorsal hands. No other new complaints.  Review of Systems  Constitutional: Negative for appetite change, chills, fatigue, fever and unexpected weight change.  HENT:   Negative for hearing loss and voice change.   Eyes: Negative for eye problems and icterus.  Respiratory: Negative for chest tightness, cough and shortness of breath.   Cardiovascular: Negative for chest pain and leg swelling.  Gastrointestinal: Negative for abdominal distention and abdominal pain.  Endocrine: Negative for hot flashes.  Genitourinary: Negative for difficulty urinating, dysuria and frequency.   Musculoskeletal: Negative for arthralgias.  Skin: Negative for itching and rash.       Status post left groin mass resection. Skin hypo-pigmentation on upper extremities.  Neurological: Negative for light-headedness and numbness.  Hematological: Negative for adenopathy. Does not bruise/bleed easily.  Psychiatric/Behavioral: Negative for confusion.    MEDICAL HISTORY:  Past Medical History:  Diagnosis Date  . Anxiety   . Arthritis   . Complication of anesthesia   . Family history of adverse reaction to anesthesia    PONV mother  . Hyperlipidemia   . Hypertension   . Melanoma ([place]) 2012  . PONV (postoperative nausea and vomiting) 04/16/2019  . Presence of permanent cardiac pacemaker    Medtronic  . Seizures ([place])     SURGICAL HISTORY: Past Surgical History:  Procedure Laterality Date  . KNEE SURGERY Left   . LEFT HEART CATH AND CORONARY ANGIOGRAPHY Left 06/29/2017   Procedure: LEFT HEART CATH AND CORONARY ANGIOGRAPHY;  Surgeon: [name],  MD;  Location: [place] [facility];  Service: Cardiovascular;  Laterality: Left;  . LYMPH NODE DISSECTION Left 04/16/2019   Procedure: Left inguinal Lymph Node Dissection;  Surgeon: [name], MD;  Location: [place];  Service: General;  Laterality: Left;  Marland Kitchen MELANOMA EXCISION Left 04/16/2019   Procedure: MELANOMA EXCISION LEFT GROIN MASS;  Surgeon: [name], MD;  Location: [place];  Service: General;  Laterality: Left;  Marland Kitchen MELANOMA EXCISION WITH SENTINEL LYMPH NODE BIOPSY Left 2012   Left calf   . PACEMAKER INSERTION N/A 08/26/2018   Procedure: INSERTION PACEMAKER;  Surgeon: [name], MD;  Location: [facility];  Service: Cardiovascular;  Laterality: N/A;  . TEMPORARY PACEMAKER N/A 08/25/2018   Procedure: TEMPORARY PACEMAKER;  Surgeon: [name], MD;  Location: [place] [facility];  Service: Cardiovascular;  Laterality: N/A;    SOCIAL HISTORY: Social History   Socioeconomic History  . Marital status: Married    Spouse name: Vicente Males   . Number of children: 7  . Years of education: 80  . Highest education level: Not on file  Occupational History  . Occupation: [employer]   Tobacco Use  . Smoking status: Never Smoker  . Smokeless tobacco: Never Used  Substance and Sexual Activity  . Alcohol use: No  . Drug use: No  . Sexual activity: Not on file  Other Topics Concern  . Not on file  Social History Narrative   Lives with mother, wife and sister   Caffeine use: sodas (2 per day)   Social Determinants of Health   Financial Resource Strain: Low Risk   . Difficulty of Paying Living Expenses: Not hard at all  Food Insecurity: No Food Insecurity  . Worried About Charity fundraiser in the Last Year: Never true  . Ran Out of Food in the Last Year: Never true  Transportation Needs: Unmet Transportation Needs  . Lack of Transportation (Medical): Yes  . Lack of Transportation (Non-Medical): Yes  Physical Activity: Unknown  . Days of Exercise per Week: 0 days  . Minutes of  Exercise per Session: Not on file  Stress: No Stress Concern Present  . Feeling of Stress : Only a little  Social Connections: Unknown  . Frequency of Communication with Friends and Family: More than three times a week  . Frequency of Social Gatherings with Friends and Family: Not on file  . Attends Religious Services: Not on file  . Active Member of Clubs or Organizations: Not on file  . Attends Archivist Meetings: Not on file  . Marital Status: Married  Human resources officer Violence: Not At Risk  . Fear of Current or Ex-Partner: No  . Emotionally Abused: No  . Physically Abused: No  . Sexually Abused: No    FAMILY HISTORY: Family History  Problem Relation Age of Onset  . Cancer Paternal Grandmother     ALLERGIES:  is allergic to ibuprofen and nsaids.  MEDICATIONS:  Current Outpatient Medications  Medication Sig Dispense Refill  . acetaminophen (TYLENOL) 650 MG CR tablet Take 650 mg by mouth See admin instructions. Take 1 tablet (650 mg) by mouth scheduled for arthritis pain, may take an additional dose if needed for pain.    Marland Kitchen allopurinol (ZYLOPRIM) 300 MG tablet Take 300 mg by mouth daily.    Marland Kitchen  aspirin 81 MG chewable tablet Chew 81 mg by mouth daily.    Marland Kitchen atorvastatin (LIPITOR) 20 MG tablet Take 20 mg by mouth daily.    . clonazePAM (KLONOPIN) 0.5 MG tablet 1 tablet in the morning, 2 in the evening (Patient taking differently: Take 0.5-1 mg by mouth 2 (two) times daily as needed (anxiety). ) 90 tablet 3  . hydrocortisone 2.5 % cream Apply 1 application topically daily as needed (for itching).     Marland Kitchen ketoconazole (NIZORAL) 2 % cream Apply 1 application topically daily as needed for irritation.     . lidocaine (LIDODERM) 5 % Place 1 patch onto the skin every 12 (twelve) hours. Remove & Discard patch within 12 hours or as directed by MD 10 patch 0  . lisinopril (ZESTRIL) 20 MG tablet Take 20 mg by mouth daily.    . metoprolol succinate (TOPROL-XL) 25 MG 24 hr tablet Take 25  mg by mouth daily.     . Multiple Vitamin (MULTIVITAMIN WITH MINERALS) TABS tablet Take 1 tablet by mouth daily. Centrum Silver    . Cefdinir (OMNICEF PO) Take by mouth.    . ondansetron (ZOFRAN) 8 MG tablet Take 1 tablet (8 mg total) by mouth every 8 (eight) hours as needed for nausea, vomiting or refractory nausea / vomiting. (Patient not taking: Reported on 07/14/2019) 30 tablet 0  . silver sulfADIAZINE (SILVADENE) 1 % cream Apply 1 application topically 2 (two) times daily. (Patient not taking: Reported on 08/06/2019) 50 g 2  . traMADol (ULTRAM) 50 MG tablet Take 1 tablet (50 mg total) by mouth every 6 (six) hours as needed. (Patient not taking: Reported on 06/18/2019) 30 tablet 0   No current facility-administered medications for this visit.     PHYSICAL EXAMINATION: ECOG PERFORMANCE STATUS: 0 - Asymptomatic Vitals:   08/06/19 0913  BP: (!) 149/76  Pulse: 69  Resp: 18  Temp: (!) 96.2 F (35.7 C)   Filed Weights   08/06/19 0913  Weight: 225 lb 12.8 oz (102.4 kg)    Physical Exam Constitutional:      General: He is not in acute distress. HENT:     Head: Normocephalic and atraumatic.  Eyes:     General: No scleral icterus.    Pupils: Pupils are equal, round, and reactive to light.  Cardiovascular:     Rate and Rhythm: Normal rate and regular rhythm.     Heart sounds: Normal heart sounds.  Pulmonary:     Effort: Pulmonary effort is normal. No respiratory distress.     Breath sounds: No wheezing.  Abdominal:     General: Bowel sounds are normal. There is no distension.     Palpations: Abdomen is soft. There is no mass.     Tenderness: There is no abdominal tenderness.     Comments: Status post left groin mass resection and status post radiation. Erythematous areas at the surgical/radiation sites.  Erythema has improved.  Musculoskeletal:        General: No deformity. Normal range of motion.     Cervical back: Normal range of motion and neck supple.  Skin:    General:  Skin is warm and dry.     Findings: No erythema or rash.  Neurological:     Mental Status: He is alert and oriented to person, place, and time. Mental status is at baseline.     Cranial Nerves: No cranial nerve deficit.     Coordination: Coordination normal.  Psychiatric:  Mood and Affect: Mood normal.    LABORATORY DATA:  I have reviewed the data as listed Lab Results  Component Value Date   WBC 3.8 (L) 08/06/2019   HGB 11.9 (L) 08/06/2019   HCT 35.0 (L) 08/06/2019   MCV 86.8 08/06/2019   PLT 192 08/06/2019   Recent Labs    07/14/19 0947 07/23/19 0837 08/06/19 0840  NA 139 141 140  K 4.3 4.1 4.5  CL 104 106 106  CO2 _0 GLUCOSE 98 119* 107*  BUN 20 22* 19  CREATININE 1.07 1.04 1.15  CALCIUM 9.2 9.1 9.3  GFRNONAA >60 >60 >60  GFRAA >60 >60 >60  PROT 7.4 7.2 7.4  ALBUMIN 4.2 4.3 4.4  AST _1 ALT 31 37 35  ALKPHOS 95 88 91  BILITOT 0.5 0.6 0.6   Iron/TIBC/Ferritin/ %Sat No results found for: IRON, TIBC, FERRITIN, IRONPCTSAT    RADIOGRAPHIC STUDIES: I have personally reviewed the radiological images as listed and agreed with the findings in the report. US Venous Img Lower Bilateral (DVT)  Result Date: 06/09/2019 CLINICAL DATA:  Leg swelling for 1 month, history of left inguinal node dissection EXAM: Bilateral LOWER EXTREMITY VENOUS DOPPLER ULTRASOUND TECHNIQUE: Gray-scale sonography with compression, as well as color and duplex ultrasound, were performed to evaluate the deep venous system(s) from the level of the common femoral vein through the popliteal and proximal calf veins. COMPARISON:  01/31/2019 FINDINGS: VENOUS Normal compressibility of the common femoral, superficial femoral, and popliteal veins, as well as the visualized calf veins. Visualized portions of profunda femoral vein and great saphenous vein unremarkable. No filling defects to suggest DVT on grayscale or color Doppler imaging. Doppler waveforms show normal direction of venous flow,  normal respiratory phasicity and response to augmentation. OTHER Heterogenous elongated hypoechoic area with internal fluid at the left groin, poorly measurable. Complex cyst and solid mass measuring 3.2 x 1 point 9 x 2.7 cm. Limitations: none IMPRESSION: No femoropopliteal DVT nor evidence of DVT within the visualized calf veins. If clinical symptoms are inconsistent or if there are persistent or worsening symptoms, further imaging (possibly involving the iliac veins) may be warranted. Heterogenous hypoechoic area with internal fluid echogenicity at the left groin, potentially related to history of inguinal no dissection and representing postsurgical change. Complex cystic and solid mass measuring 3.2 cm, appears separate from the heterogenous hypoechoic tissue and may represent enlarged node with necrosis or infection. Electronically Signed   By: Donavan Foil M.D.   On: 06/09/2019 17:07       ASSESSMENT & PLAN:  1. Malignant melanoma of overlapping sites ([place])   2. Encounter for antineoplastic immunotherapy   3. Vitiligo    #Left inguinal nodal recurrence of melanoma.  Status post resection and adjuvant radiation. Labs are reviewed and discussed with patient. Proceed with adjuvant nivolumab today. Plan repeat CT scans after 4 cycles of immunotherapy.  #Vitiligo Patient has pre-existing vitiligo on bilateral dorsal side of his hands.   Now he has developed additional vitiligo spots on bilateral upper extremity.  I discussed with patient that immunotherapy may have caused worsening of his pre-existing vitiligo.  He agrees with proceeding with additional immunotherapy treatments.  #Normocytic anemia, hemoglobin has been stable.  11.9 today.  Continue to monitor. Check anemia work up with iron panel, folate and B12.  All questions were answered. The patient knows to call the clinic with any problems questions or concerns.  Follow-up with 2 weeks. We spent sufficient time  to discuss many  aspect of care, questions were answered to patient's satisfaction.   [name], MD, PhD Hematology Oncology [facility] at [facility] Pager- [phone] 08/06/2019

## 2019-08-06 NOTE — Therapy (Signed)
[place] MAIN [facility] SERVICES [address] [place], [place], [address] Phone: [phone]   Fax:  [phone]  Occupational Therapy Treatment  Patient Details  Name: Edward Pitts MRN: [id number] Date of Birth: [birth year] Referring Provider (OT): [name], MD   Encounter Date: 08/06/2019  OT End of Session - 08/06/19 1620    Visit Number  6    Date for OT Re-Evaluation  10/22/19    OT Start Time  0200    OT Stop Time  0305    OT Time Calculation (min)  65 min    Activity Tolerance  Patient tolerated treatment well;No increased pain       Past Medical History:  Diagnosis Date  . Anxiety   . Arthritis   . Complication of anesthesia   . Family history of adverse reaction to anesthesia    PONV mother  . Hyperlipidemia   . Hypertension   . Melanoma ([place]) 2012  . PONV (postoperative nausea and vomiting) 04/16/2019  . Presence of permanent cardiac pacemaker    Medtronic  . Seizures ([place])     Past Surgical History:  Procedure Laterality Date  . KNEE SURGERY Left   . LEFT HEART CATH AND CORONARY ANGIOGRAPHY Left 06/29/2017   Procedure: LEFT HEART CATH AND CORONARY ANGIOGRAPHY;  Surgeon: [name], MD;  Location: [place] [facility];  Service: Cardiovascular;  Laterality: Left;  . LYMPH NODE DISSECTION Left 04/16/2019   Procedure: Left inguinal Lymph Node Dissection;  Surgeon: [name], MD;  Location: [place];  Service: General;  Laterality: Left;  Marland Kitchen MELANOMA EXCISION Left 04/16/2019   Procedure: MELANOMA EXCISION LEFT GROIN MASS;  Surgeon: [name], MD;  Location: [place];  Service: General;  Laterality: Left;  Marland Kitchen MELANOMA EXCISION WITH SENTINEL LYMPH NODE BIOPSY Left 2012   Left calf   . PACEMAKER INSERTION N/A 08/26/2018   Procedure: INSERTION PACEMAKER;  Surgeon: [name], MD;  Location: [facility];  Service: Cardiovascular;  Laterality: N/A;  . TEMPORARY PACEMAKER N/A 08/25/2018   Procedure: TEMPORARY PACEMAKER;   Surgeon: [name], MD;  Location: [place] [facility];  Service: Cardiovascular;  Laterality: N/A;    There were no vitals filed for this visit.  Subjective Assessment - 08/06/19 1414    Subjective   Edward Pitts presents to OT for visit 5/36 to address LLE lymphedema 2/2 melanoma Rx. Pt presents loaned compression knee high in place. Pt reports his wife was able to apply gradient compression wrap yesterday. Pt denies leg pain today.    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 with surgical excision w/ LN disection (-7/7 LN); Completed adjuvant XRT 07/07/2019; 08/2018 Pacemaker placed; L knee sx date?;    Limitations  chronic LLE knee pain; post surgical LLE pain,  chronic LLE/LLQ swelling, decreased LLE strength, impaired gait, decreased dynamic balance, altered sensation LLE, increased risk of infection, decreased standing and walking tolerance > 1 hr; decreased ankle AROM    Repetition  Increases Symptoms    Special Tests  +Stemmer base L toes    Patient Stated Goals  reduce swelling to normal and keep it from getting worse    Pain Onset  Other (comment)   post surgical pain onset 1/21; L knee pain chronic                  OT Treatments/Exercises (OP) - 08/06/19 0001      ADLs   ADL  Education Given  Yes      Manual Therapy   Manual Therapy  Edema management;Compression Bandaging;Manual Lymphatic Drainage (MLD)    Manual Lymphatic Drainage (MLD)  MLDto LLE/LLQ in supine and sidelying utilizing short neck sequence, deep abdominals with diaphragmatic breathing, ipsilateral inguinal -axiillary anastomosis and proximal to distal leg sequence.     Compression Bandaging  LLE knee length compression wraps as established. Intended to apply thigh length wraps but Pt does not bring all supplied to clinic today.             OT Education - 08/06/19 1417    Education Details  Continued skilled Pt/caregiver education  And LE ADL  training throughout visit for lymphedema self care/ home program, including compression wrapping, compression garment and device wear/care, lymphatic pumping ther ex, simple self-MLD, and skin care. Discussed progress towards goals.    Person(s) Educated  Patient;Spouse    Methods  Explanation;Demonstration;Handout;Tactile cues;Verbal cues    Comprehension  Verbalized understanding;Returned demonstration;Need further instruction;Verbal cues required;Tactile cues required          OT Long Term Goals - 07/24/19 1729      OT LONG TERM GOAL #1   Title  Pt will be able to apply thigh length, multi-layer, short stretch compression wraps daily to single limb using correct gradient techniques with modified independence (extra time) to achieve optimal limb volume reduction, to return affected limb/s, as closely as possible, to premorbid size and shape, to limit infection risk, and to improve safe functional ambulation and mobility.    Baseline  Max A    Time  4    Period  Days    Status  New    Target Date  --   4th OT Rx visit     OT LONG TERM GOAL #2   Title  Pt will be able to verbalize signs and symptoms of cellulitis infection and identify at least 4 common lymphedema precautions using printed resource for reference to limit LE progression over time to limit risk of infection and LE exacerbation.    Baseline  Max A    Time  4    Period  Days    Target Date  --   4th OT Rx visit     OT LONG TERM GOAL #3   Title  Pt will tolerate thigh length, multilayer short stretch compression wrap to single leg up to 23/ 7 to achieve optimal swelling reduction to arrest lymphedema progression and in preparation for fitting appropriate compression garments/ devices.    Baseline  Max A    Time  12    Period  Weeks    Status  New    Target Date  10/22/19      OT LONG TERM GOAL #4   Title  Pt will achieve 10% or greater limb volume reduction to LLE from toes to groin to enable functional improvements  including fitting into preferred footwear and clothing, decreased infection risk and improved body image.    Baseline  Max A    Time  12    Period  Weeks    Status  New    Target Date  10/22/19      OT LONG TERM GOAL #5   Title  Pt will achieve and sustain at least 85%  compliance with all daily LE self-care home program components, to include skin care, lymphatic pumping therex, compression wraps and garments/devices, and simple self-MLD, to reduce and control limb swelling, reduce infection risk  and limit LE progression.    Baseline  Max A    Time  12    Period  Weeks    Status  New    Target Date  10/22/19      Long Term Additional Goals   Additional Long Term Goals  Yes      OT LONG TERM GOAL #6   Title  Pt will achieve modified independence ( extra time, assistive device) with donning and doffing day and night time compression garments and devices to limit re-accumulation of lymphatic congestion and progression of lymphedema.    Baseline  Max A    Time  12    Period  Weeks    Status  New    Target Date  10/22/19            Plan - 08/06/19 1621    Clinical Impression Statement  MLD to LLE and LLQ directing flow towards deep abdominal pathways and ipsilateral inguinal - axillary anastamosis. Pt tolerated in supine and sidelying without difficulty. Pt has mastered diaphragmatic breathing with waning verbal cues today. Cont as per POC.    OT Occupational Profile and History  Comprehensive Assessment- Review of records and extensive additional review of physical, cognitive, psychosocial history related to current functional performance    Occupational performance deficits (Please refer to evaluation for details):  ADL's;IADL's;Rest and Sleep;Work;Leisure;Social Participation;Other   body image, role performance   Body Structure / Function / Physical Skills  ADL;ROM;Scar mobility;IADL;Edema;Balance;Sensation;Skin integrity;Mobility;Flexibility;Strength;Decreased knowledge of  precautions;Gait;Pain;Decreased knowledge of use of DME    Rehab Potential  Good    Clinical Decision Making  Several treatment options, min-mod task modification necessary    Comorbidities Affecting Occupational Performance:  Presence of comorbidities impacting occupational performance    Modification or Assistance to Complete Evaluation   Min-Moderate modification of tasks or assist with assess necessary to complete eval    OT Frequency  2x / week    OT Duration  12 weeks   and PRN   OT Treatment/Interventions  Self-care/ADL training;Therapeutic exercise;Functional Mobility Training;Manual Therapy;Coping strategies training;Therapeutic activities;Manual lymph drainage;Energy conservation;DME and/or AE instruction;Compression bandaging;Other (comment);Scar mobilization;Patient/family education   fit with appropriate compression garments once swelling is reduced   Plan  Complete Decongestive Therapy (CDT), Intensive and Management Phases to include  manual lymphatyic drainage (MLD), skin care, therapeutic exercise, compression with short stretch wraps and garments, Pt edu for LE sef care    Consulted and Agree with Plan of Care  Patient       Patient will benefit from skilled therapeutic intervention in order to improve the following deficits and impairments:   Body Structure / Function / Physical Skills: ADL, ROM, Scar mobility, IADL, Edema, Balance, Sensation, Skin integrity, Mobility, Flexibility, Strength, Decreased knowledge of precautions, Gait, Pain, Decreased knowledge of use of DME       Visit Diagnosis: Lymphedema, not elsewhere classified    Problem List Patient Active Problem List   Diagnosis Date Noted  . Encounter for antineoplastic immunotherapy 08/06/2019  . Malignant melanoma of overlapping sites ([place]) 04/23/2019  . Goals of care, counseling/discussion 04/23/2019  . 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 Near syncope 12/17/2018  . AKI (acute  kidney injury) ([place]) 08/24/2018  . Acute hyperkalemia 08/24/2018  . HTN (hypertension) 08/24/2018  . HLD (hyperlipidemia) 08/24/2018  . Seizures ([place]) 08/24/2018  . Complete heart block ([place]) 08/24/2018  . Stable angina ([place]) 06/27/2017    Andrey Spearman, MS, OTR/L, [facility] 08/06/19 4:23 PM  [place] MAIN [facility] SERVICES [address] [place], [place], [address] Phone: [phone]   Fax:  [phone]  Name: Yahel Liming MRN: [id number] Date of Birth: [birth year]

## 2019-08-07 ENCOUNTER — Encounter: Payer: BC Managed Care – PPO | Admitting: Physical Therapy

## 2019-08-07 ENCOUNTER — Ambulatory Visit: Payer: BC Managed Care – PPO | Admitting: Radiation Oncology

## 2019-08-08 ENCOUNTER — Ambulatory Visit: Payer: BC Managed Care – PPO | Admitting: Occupational Therapy

## 2019-08-08 ENCOUNTER — Telehealth: Payer: Self-pay

## 2019-08-08 ENCOUNTER — Encounter: Payer: Self-pay | Admitting: Radiation Oncology

## 2019-08-08 ENCOUNTER — Other Ambulatory Visit: Payer: Self-pay

## 2019-08-08 DIAGNOSIS — I890 Lymphedema, not elsewhere classified: Secondary | ICD-10-CM | POA: Diagnosis not present

## 2019-08-08 NOTE — Telephone Encounter (Signed)
Patient is trying to increase his disablity and request me to call [facility] to see what will they will need.  Kirke Shaggy at [facility] says they need documentation of patient's diagnosis, chronicity, and any treatments he has received for diagnosis.  I have faxed over Dr. Collie Siad last MD visit note and request that she send a records request to our medical records department if more detailed information needed.

## 2019-08-08 NOTE — Therapy (Signed)
[place] MAIN [facility] SERVICES [address] [place], [place], [address] Phone: [phone]   Fax:  [phone]  Occupational Therapy Treatment  Patient Details  Name: Edward Pitts MRN: [id number] Date of Birth: 09/29/1960 Referring Provider (OT): [name], MD   Encounter Date: 08/08/2019  OT End of Session - 08/08/19 1030    Visit Number  7    Number of Visits  36    Date for OT Re-Evaluation  10/22/19    OT Start Time  0800    OT Stop Time  0910    OT Time Calculation (min)  70 min    Activity Tolerance  Patient tolerated treatment well;No increased pain       Past Medical History:  Diagnosis Date  . Anxiety   . Arthritis   . Complication of anesthesia   . Family history of adverse reaction to anesthesia    PONV mother  . Hyperlipidemia   . Hypertension   . Melanoma ([place]) 2012  . PONV (postoperative nausea and vomiting) 04/16/2019  . Presence of permanent cardiac pacemaker    Medtronic  . Seizures ([place])     Past Surgical History:  Procedure Laterality Date  . KNEE SURGERY Left   . LEFT HEART CATH AND CORONARY ANGIOGRAPHY Left 06/29/2017   Procedure: LEFT HEART CATH AND CORONARY ANGIOGRAPHY;  Surgeon: [name], MD;  Location: [place] [facility];  Service: Cardiovascular;  Laterality: Left;  . LYMPH NODE DISSECTION Left 04/16/2019   Procedure: Left inguinal Lymph Node Dissection;  Surgeon: [name], MD;  Location: [place];  Service: General;  Laterality: Left;  Marland Kitchen MELANOMA EXCISION Left 04/16/2019   Procedure: MELANOMA EXCISION LEFT GROIN MASS;  Surgeon: [name], MD;  Location: [place];  Service: General;  Laterality: Left;  Marland Kitchen MELANOMA EXCISION WITH SENTINEL LYMPH NODE BIOPSY Left 2012   Left calf   . PACEMAKER INSERTION N/A 08/26/2018   Procedure: INSERTION PACEMAKER;  Surgeon: [name], MD;  Location: [facility];  Service: Cardiovascular;  Laterality: N/A;  . TEMPORARY PACEMAKER N/A 08/25/2018   Procedure: TEMPORARY PACEMAKER;  Surgeon: [name], MD;  Location: [place] [facility];  Service: Cardiovascular;  Laterality: N/A;    There were no vitals filed for this visit.  Subjective Assessment - 08/08/19 1032    Subjective   Edward Pitts presents to OT for visit 7/36 to address LLE lymphedema 2/2 melanoma Rx. Pt presents with knee length compression wraps applied last session in place. He brings all bandaging supplied to clinic as directed. Pt reports skin condition and discomfort in XRT field is "getting better". After removing compression wraps, "I can see my ankle again".    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 with surgical excision w/ LN disection (-7/7 LN); Completed adjuvant XRT 07/07/2019; 08/2018 Pacemaker placed; L knee sx date?;    Limitations  chronic LLE knee pain; post surgical LLE pain,  chronic LLE/LLQ swelling, decreased LLE strength, impaired gait, decreased dynamic balance, altered sensation LLE, increased risk of infection, decreased standing and walking tolerance > 1 hr; decreased ankle AROM    Repetition  Increases Symptoms    Special Tests  +Stemmer base L toes    Patient Stated Goals  reduce swelling to normal and keep it from getting worse    Pain Onset  Other (comment)   post surgical pain onset 1/21; L knee pain chronic  OT Education - 08/08/19 1034    Education Details  Pt education for advanced Flexitouch sequential pneumatic compression device ("pump"), including clinical rational, precautions and contraindications. Handouts given.    Person(s) Educated  Patient    Methods  Explanation;Demonstration;Handout    Comprehension  Verbalized understanding;Returned demonstration;Need further instruction          OT Long Term Goals - 07/24/19 1729      OT LONG TERM GOAL #1   Title  Pt will be able to apply thigh length, multi-layer, short stretch compression  wraps daily to single limb using correct gradient techniques with modified independence (extra time) to achieve optimal limb volume reduction, to return affected limb/s, as closely as possible, to premorbid size and shape, to limit infection risk, and to improve safe functional ambulation and mobility.    Baseline  Max A    Time  4    Period  Days    Status  New    Target Date  --   4th OT Rx visit     OT LONG TERM GOAL #2   Title  Pt will be able to verbalize signs and symptoms of cellulitis infection and identify at least 4 common lymphedema precautions using printed resource for reference to limit LE progression over time to limit risk of infection and LE exacerbation.    Baseline  Max A    Time  4    Period  Days    Target Date  --   4th OT Rx visit     OT LONG TERM GOAL #3   Title  Pt will tolerate thigh length, multilayer short stretch compression wrap to single leg up to 23/ 7 to achieve optimal swelling reduction to arrest lymphedema progression and in preparation for fitting appropriate compression garments/ devices.    Baseline  Max A    Time  12    Period  Weeks    Status  New    Target Date  10/22/19      OT LONG TERM GOAL #4   Title  Pt will achieve 10% or greater limb volume reduction to LLE from toes to groin to enable functional improvements including fitting into preferred footwear and clothing, decreased infection risk and improved body image.    Baseline  Max A    Time  12    Period  Weeks    Status  New    Target Date  10/22/19      OT LONG TERM GOAL #5   Title  Pt will achieve and sustain at least 85%  compliance with all daily LE self-care home program components, to include skin care, lymphatic pumping therex, compression wraps and garments/devices, and simple self-MLD, to reduce and control limb swelling, reduce infection risk and limit LE progression.    Baseline  Max A    Time  12    Period  Weeks    Status  New    Target Date  10/22/19      Long Term  Additional Goals   Additional Long Term Goals  Yes      OT LONG TERM GOAL #6   Title  Pt will achieve modified independence ( extra time, assistive device) with donning and doffing day and night time compression garments and devices to limit re-accumulation of lymphatic congestion and progression of lymphedema.    Baseline  Max A    Time  12    Period  Weeks    Status  New    Target Date  10/22/19            Plan - 08/08/19 1031    Clinical Impression Statement  MLD to LLE and LLQ directing flow towards deep abdominal pathways and ipsilateral inguinal - axillary anastamosis. Pt tolerated in supine and sidelying without difficulty. Thigh length gradient compression wraps applied as established fro,m base of toes to groin. Pt demonstrating excellent compliance with compression between sessions with Max A from spouse. Cont as per POC.    OT Occupational Profile and History  Comprehensive Assessment- Review of records and extensive additional review of physical, cognitive, psychosocial history related to current functional performance    Occupational performance deficits (Please refer to evaluation for details):  ADL's;IADL's;Rest and Sleep;Work;Leisure;Social Participation;Other   body image, role performance   Body Structure / Function / Physical Skills  ADL;ROM;Scar mobility;IADL;Edema;Balance;Sensation;Skin integrity;Mobility;Flexibility;Strength;Decreased knowledge of precautions;Gait;Pain;Decreased knowledge of use of DME    Rehab Potential  Good    Clinical Decision Making  Several treatment options, min-mod task modification necessary    Comorbidities Affecting Occupational Performance:  Presence of comorbidities impacting occupational performance    Modification or Assistance to Complete Evaluation   Min-Moderate modification of tasks or assist with assess necessary to complete eval    OT Frequency  2x / week    OT Duration  12 weeks   and PRN   OT Treatment/Interventions   Self-care/ADL training;Therapeutic exercise;Functional Mobility Training;Manual Therapy;Coping strategies training;Therapeutic activities;Manual lymph drainage;Energy conservation;DME and/or AE instruction;Compression bandaging;Other (comment);Scar mobilization;Patient/family education   fit with appropriate compression garments once swelling is reduced   Plan  Complete Decongestive Therapy (CDT), Intensive and Management Phases to include  manual lymphatyic drainage (MLD), skin care, therapeutic exercise, compression with short stretch wraps and garments, Pt edu for LE sef care    Consulted and Agree with Plan of Care  Patient       Patient will benefit from skilled therapeutic intervention in order to improve the following deficits and impairments:   Body Structure / Function / Physical Skills: ADL, ROM, Scar mobility, IADL, Edema, Balance, Sensation, Skin integrity, Mobility, Flexibility, Strength, Decreased knowledge of precautions, Gait, Pain, Decreased knowledge of use of DME       Visit Diagnosis: Lymphedema, not elsewhere classified    Problem List Patient Active Problem List   Diagnosis Date Noted  . Encounter for antineoplastic immunotherapy 08/06/2019  . Malignant melanoma of overlapping sites ([place]) 04/23/2019  . Goals of care, counseling/discussion 04/23/2019  . 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 Near syncope 12/17/2018  . Anemia in chronic kidney disease 12/11/2018  . Benign hypertensive kidney disease with chronic kidney disease 12/11/2018  . Stage 3a chronic kidney disease 12/11/2018  . AKI (acute kidney injury) ([place]) 08/24/2018  . Acute hyperkalemia 08/24/2018  . HTN (hypertension) 08/24/2018  . HLD (hyperlipidemia) 08/24/2018  . Seizures ([place]) 08/24/2018  . Complete heart block ([place]) 08/24/2018  . Arthritis of wrist, left 06/12/2018  . Bradycardia 06/04/2018  . 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 Positive ANA  (antinuclear antibody) 03/05/2018  . Swelling of joint of left wrist 03/05/2018  . Coronary artery disease involving native coronary artery of native heart 07/09/2017  . Palpitations 07/09/2017  . Stable angina ([place]) 06/27/2017  . Benign essential HTN 05/25/2017  . LBBB (left bundle branch block) 05/25/2017    Andrey Spearman, MS, OTR/L, [facility] 08/08/19 10:38 AM   [place] MAIN REHAB SERVICES [place]  [place], [place], [address] Phone: [phone]   Fax:  [phone]  Name: Edward Pitts MRN: [id number] Date of Birth: 10/20/1960

## 2019-08-11 ENCOUNTER — Encounter: Payer: Self-pay | Admitting: Radiation Oncology

## 2019-08-11 ENCOUNTER — Ambulatory Visit
Admission: RE | Admit: 2019-08-11 | Discharge: 2019-08-11 | Disposition: A | Discharge status: Home / Self Care | Payer: BC Managed Care – PPO | Source: Ambulatory Visit | Attending: Radiation Oncology | Admitting: Radiation Oncology

## 2019-08-11 ENCOUNTER — Ambulatory Visit: Payer: BC Managed Care – PPO | Admitting: Occupational Therapy

## 2019-08-11 ENCOUNTER — Other Ambulatory Visit: Payer: Self-pay

## 2019-08-11 VITALS — BP 136/77 | HR 89 | Temp 96.9°F | Resp 16 | Wt 225.1 lb

## 2019-08-11 DIAGNOSIS — C779 Secondary and unspecified malignant neoplasm of lymph node, unspecified: Secondary | ICD-10-CM | POA: Diagnosis not present

## 2019-08-11 DIAGNOSIS — C439 Malignant melanoma of skin, unspecified: Secondary | ICD-10-CM | POA: Insufficient documentation

## 2019-08-11 DIAGNOSIS — Z923 Personal history of irradiation: Secondary | ICD-10-CM | POA: Diagnosis not present

## 2019-08-11 DIAGNOSIS — I890 Lymphedema, not elsewhere classified: Secondary | ICD-10-CM

## 2019-08-11 NOTE — Progress Notes (Signed)
Radiation Oncology Follow up Note  Name: Edward Pitts   Date:   08/11/2019 MRN:  [id number] DOB: 19-Oct-1960    This 59 y.o. male presents to the clinic today for 1 month follow-up status post radiation therapy to his left groin in patient with known area resected of large metastatic malignant melanoma.  REFERRING PROVIDER: [name], [name], MD  HPI: Patient is a 59 year old male now at 1 month having completed adjuvant radiation therapy to his left groin involved with a large confluent area of metastatic malignant melanoma.  Seen today in routine follow-up he is doing well he states lymphedema in his left lower extremities improving he has been going to lymphedema clinic.  His skin is also well-healed.Marland Kitchen  He is currently onadjuvant nivolumab and tolerating that well.  COMPLICATIONS OF TREATMENT: none  FOLLOW UP COMPLIANCE: keeps appointments   PHYSICAL EXAM:  BP 136/77 (BP Location: Left Arm, Patient Position: Sitting, Cuff Size: Normal)   Pulse 89   Temp (!) 96.9 F (36.1 C) (Tympanic)   Resp 16   Wt 225 lb 1.6 oz (102.1 kg)   BMI (P) 35.26 kg/m  Area the left groin has some slight dry desquamation of skin no evidence of mass or nodularities appreciated at this time.  Does have lymphedema of the left lower extremity.  Well-developed well-nourished patient in NAD. HEENT reveals PERLA, EOMI, discs not visualized.  Oral cavity is clear. No oral mucosal lesions are identified. Neck is clear without evidence of cervical or supraclavicular adenopathy. Lungs are clear to A&P. Cardiac examination is essentially unremarkable with regular rate and rhythm without murmur rub or thrill. Abdomen is benign with no organomegaly or masses noted. Motor sensory and DTR levels are equal and symmetric in the upper and lower extremities. Cranial nerves II through XII are grossly intact. Proprioception is intact. No peripheral adenopathy or edema is identified. No motor or sensory levels are noted. Crude visual  fields are within normal range.  RADIOLOGY RESULTS: No current films for review  PLAN: Present time he is recovering nicely from his radiation therapy.  And pleased with his overall progress.  I have asked to see him back in 4 to 5 months for follow-up.  He continues close follow-up care and treatment with immunotherapy through medical oncology.  Patient knows to call with any concerns.  I would like to take this opportunity to thank you for allowing me to participate in the care of your patient.Noreene Filbert, MD

## 2019-08-11 NOTE — Therapy (Signed)
[place] MAIN [facility] SERVICES [address] [place], [place], [address] Phone: [phone]   Fax:  [phone]  Occupational Therapy Treatment  Patient Details  Name: Edward Pitts MRN: [id number] Date of Birth: 1960-04-09 Referring Provider (OT): [name], MD   Encounter Date: 08/11/2019  OT End of Session - 08/11/19 1617    Visit Number  8    Number of Visits  36    Date for OT Re-Evaluation  10/22/19    OT Start Time  0210    OT Stop Time  0310    OT Time Calculation (min)  60 min    Activity Tolerance  Patient tolerated treatment well;No increased pain       Past Medical History:  Diagnosis Date  . Anxiety   . Arthritis   . Complication of anesthesia   . Family history of adverse reaction to anesthesia    PONV mother  . Hyperlipidemia   . Hypertension   . Melanoma ([place]) 2012  . PONV (postoperative nausea and vomiting) 04/16/2019  . Presence of permanent cardiac pacemaker    Medtronic  . Seizures ([place])     Past Surgical History:  Procedure Laterality Date  . KNEE SURGERY Left   . LEFT HEART CATH AND CORONARY ANGIOGRAPHY Left 06/29/2017   Procedure: LEFT HEART CATH AND CORONARY ANGIOGRAPHY;  Surgeon: [name], MD;  Location: [place] [facility];  Service: Cardiovascular;  Laterality: Left;  . LYMPH NODE DISSECTION Left 04/16/2019   Procedure: Left inguinal Lymph Node Dissection;  Surgeon: [name], MD;  Location: [place];  Service: General;  Laterality: Left;  Marland Kitchen MELANOMA EXCISION Left 04/16/2019   Procedure: MELANOMA EXCISION LEFT GROIN MASS;  Surgeon: [name], MD;  Location: [place];  Service: General;  Laterality: Left;  Marland Kitchen MELANOMA EXCISION WITH SENTINEL LYMPH NODE BIOPSY Left 2012   Left calf   . PACEMAKER INSERTION N/A 08/26/2018   Procedure: INSERTION PACEMAKER;  Surgeon: [name], MD;  Location: [facility];  Service: Cardiovascular;  Laterality: N/A;  . TEMPORARY PACEMAKER N/A 08/25/2018    Procedure: TEMPORARY PACEMAKER;  Surgeon: [name], MD;  Location: [place] [facility];  Service: Cardiovascular;  Laterality: N/A;    There were no vitals filed for this visit.  Subjective Assessment - 08/11/19 1613    Subjective   Edward Pitts presents to OT for visit 8/36 to address LLE lymphedema 2/2 melanoma Rx. Pt presents without compression wraps in place. Instead he presents with knee length , OTS compression stocking in place. Garment is too small and is causing tourniquet effect as evidenced by deep positive indentations at ankle and politeal fossa.    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 with surgical excision w/ LN disection (-7/7 LN); Completed adjuvant XRT 07/07/2019; 08/2018 Pacemaker placed; L knee sx date?;    Limitations  chronic LLE knee pain; post surgical LLE pain,  chronic LLE/LLQ swelling, decreased LLE strength, impaired gait, decreased dynamic balance, altered sensation LLE, increased risk of infection, decreased standing and walking tolerance > 1 hr; decreased ankle AROM    Repetition  Increases Symptoms    Special Tests  +Stemmer base L toes    Patient Stated Goals  reduce swelling to normal and keep it from getting worse    Pain Onset  Other (comment)   post surgical pain onset 1/21; L knee pain chronic  OT Treatments/Exercises (OP) - 08/11/19 0001      ADLs   ADL Education Given  Yes      Manual Therapy   Manual Therapy  Edema management;Manual Lymphatic Drainage (MLD);Compression Bandaging    Manual Lymphatic Drainage (MLD)  MLDto LLE/LLQ in supine and sidelying utilizing short neck sequence, deep abdominals with diaphragmatic breathing, ipsilateral inguinal -axiillary anastomosis and proximal to distal leg sequence.              OT Education - 08/11/19 1616    Education Details  Continued skilled Pt/caregiver education  And LE ADL training throughout visit for lymphedema  self care/ home program, including compression wrapping, compression garment and device wear/care, lymphatic pumping ther ex, simple self-MLD, and skin care. Discussed progress towards goals.    Person(s) Educated  Patient;Spouse    Methods  Explanation;Demonstration;Handout;Tactile cues;Verbal cues    Comprehension  Verbalized understanding;Returned demonstration;Need further instruction;Verbal cues required;Tactile cues required          OT Long Term Goals - 07/24/19 1729      OT LONG TERM GOAL #1   Title  Pt will be able to apply thigh length, multi-layer, short stretch compression wraps daily to single limb using correct gradient techniques with modified independence (extra time) to achieve optimal limb volume reduction, to return affected limb/s, as closely as possible, to premorbid size and shape, to limit infection risk, and to improve safe functional ambulation and mobility.    Baseline  Max A    Time  4    Period  Days    Status  New    Target Date  --   4th OT Rx visit     OT LONG TERM GOAL #2   Title  Pt will be able to verbalize signs and symptoms of cellulitis infection and identify at least 4 common lymphedema precautions using printed resource for reference to limit LE progression over time to limit risk of infection and LE exacerbation.    Baseline  Max A    Time  4    Period  Days    Target Date  --   4th OT Rx visit     OT LONG TERM GOAL #3   Title  Pt will tolerate thigh length, multilayer short stretch compression wrap to single leg up to 23/ 7 to achieve optimal swelling reduction to arrest lymphedema progression and in preparation for fitting appropriate compression garments/ devices.    Baseline  Max A    Time  12    Period  Weeks    Status  New    Target Date  10/22/19      OT LONG TERM GOAL #4   Title  Pt will achieve 10% or greater limb volume reduction to LLE from toes to groin to enable functional improvements including fitting into preferred footwear  and clothing, decreased infection risk and improved body image.    Baseline  Max A    Time  12    Period  Weeks    Status  New    Target Date  10/22/19      OT LONG TERM GOAL #5   Title  Pt will achieve and sustain at least 85%  compliance with all daily LE self-care home program components, to include skin care, lymphatic pumping therex, compression wraps and garments/devices, and simple self-MLD, to reduce and control limb swelling, reduce infection risk and limit LE progression.    Baseline  Max A  Time  12    Period  Weeks    Status  New    Target Date  10/22/19      Long Term Additional Goals   Additional Long Term Goals  Yes      OT LONG TERM GOAL #6   Title  Pt will achieve modified independence ( extra time, assistive device) with donning and doffing day and night time compression garments and devices to limit re-accumulation of lymphatic congestion and progression of lymphedema.    Baseline  Max A    Time  12    Period  Weeks    Status  New    Target Date  10/22/19            Plan - 08/11/19 1617    Clinical Impression Statement  Pt discouraged from wearing existing knee length compression garment as it is too small and is causing tourniquet w/ deep positive indentations at L ankle and politeal fossa, in tern causing lymph to be damned up in calf. Encouraged Pt to remain in compression wraps whenever possible. This is challenging for him as he is unable to apply wraps himself and hsi wife is not always avcailable to assist. Swelling is clearly increased today as Pt has been wearing poorly fitting garment since early this morning. Pt tolerated manual therapy and LLE skin care without increased pain. Positive indentations at ankles and below knee redolved      after manual therapy. Foot and ankle swelling remains very stubborn and resistant to CDT. Cont as per POC.    OT Occupational Profile and History  Comprehensive Assessment- Review of records and extensive additional  review of physical, cognitive, psychosocial history related to current functional performance    Occupational performance deficits (Please refer to evaluation for details):  ADL's;IADL's;Rest and Sleep;Work;Leisure;Social Participation;Other   body image, role performance   Body Structure / Function / Physical Skills  ADL;ROM;Scar mobility;IADL;Edema;Balance;Sensation;Skin integrity;Mobility;Flexibility;Strength;Decreased knowledge of precautions;Gait;Pain;Decreased knowledge of use of DME    Rehab Potential  Good    Clinical Decision Making  Several treatment options, min-mod task modification necessary    Comorbidities Affecting Occupational Performance:  Presence of comorbidities impacting occupational performance    Modification or Assistance to Complete Evaluation   Min-Moderate modification of tasks or assist with assess necessary to complete eval    OT Frequency  2x / week    OT Duration  12 weeks   and PRN   OT Treatment/Interventions  Self-care/ADL training;Therapeutic exercise;Functional Mobility Training;Manual Therapy;Coping strategies training;Therapeutic activities;Manual lymph drainage;Energy conservation;DME and/or AE instruction;Compression bandaging;Other (comment);Scar mobilization;Patient/family education   fit with appropriate compression garments once swelling is reduced   Plan  Complete Decongestive Therapy (CDT), Intensive and Management Phases to include  manual lymphatyic drainage (MLD), skin care, therapeutic exercise, compression with short stretch wraps and garments, Pt edu for LE sef care    Consulted and Agree with Plan of Care  Patient       Patient will benefit from skilled therapeutic intervention in order to improve the following deficits and impairments:   Body Structure / Function / Physical Skills: ADL, ROM, Scar mobility, IADL, Edema, Balance, Sensation, Skin integrity, Mobility, Flexibility, Strength, Decreased knowledge of precautions, Gait, Pain, Decreased  knowledge of use of DME       Visit Diagnosis: Lymphedema, not elsewhere classified    Problem List Patient Active Problem List   Diagnosis Date Noted  . Encounter for antineoplastic immunotherapy 08/06/2019  . Malignant melanoma of overlapping sites [facility])  04/23/2019  . Goals of care, counseling/discussion 04/23/2019  . 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 Near syncope 12/17/2018  . Anemia in chronic kidney disease 12/11/2018  . Benign hypertensive kidney disease with chronic kidney disease 12/11/2018  . Stage 3a chronic kidney disease 12/11/2018  . AKI (acute kidney injury) ([place]) 08/24/2018  . Acute hyperkalemia 08/24/2018  . HTN (hypertension) 08/24/2018  . HLD (hyperlipidemia) 08/24/2018  . Seizures ([place]) 08/24/2018  . Complete heart block ([place]) 08/24/2018  . Arthritis of wrist, left 06/12/2018  . Bradycardia 06/04/2018  . 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 Positive ANA (antinuclear antibody) 03/05/2018  . Swelling of joint of left wrist 03/05/2018  . Coronary artery disease involving native coronary artery of native heart 07/09/2017  . Palpitations 07/09/2017  . Stable angina ([place]) 06/27/2017  . Benign essential HTN 05/25/2017  . LBBB (left bundle branch block) 05/25/2017   Andrey Spearman, MS, OTR/L, [facility] 08/11/19 4:26 PM   [place] MAIN [facility] SERVICES [address] [place], [place], [address] Phone: [phone]   Fax:  [phone]  Name: Davarius Janak MRN: [id number] Date of Birth: 1961-02-10

## 2019-08-12 ENCOUNTER — Ambulatory Visit: Payer: BC Managed Care – PPO | Admitting: Physical Therapy

## 2019-08-13 ENCOUNTER — Other Ambulatory Visit: Payer: Self-pay

## 2019-08-13 ENCOUNTER — Ambulatory Visit: Payer: BC Managed Care – PPO | Admitting: Occupational Therapy

## 2019-08-13 DIAGNOSIS — I890 Lymphedema, not elsewhere classified: Secondary | ICD-10-CM | POA: Diagnosis not present

## 2019-08-13 NOTE — Therapy (Signed)
[place] MAIN [facility] SERVICES [address] [place], [place], [address] Phone: [phone]   Fax:  [phone]  Occupational Therapy Treatment  Patient Details  Name: Edward Pitts MRN: [id number] Date of Birth: 1960-12-11 Referring Provider (OT): [name], MD   Encounter Date: 08/13/2019  OT End of Session - 08/13/19 1630    Number of Visits  36    Date for OT Re-Evaluation  10/22/19    OT Start Time  0210    OT Stop Time  0310    OT Time Calculation (min)  60 min    Activity Tolerance  Patient tolerated treatment well;No increased pain       Past Medical History:  Diagnosis Date  . Anxiety   . Arthritis   . Complication of anesthesia   . Family history of adverse reaction to anesthesia    PONV mother  . Hyperlipidemia   . Hypertension   . Melanoma ([place]) 2012  . PONV (postoperative nausea and vomiting) 04/16/2019  . Presence of permanent cardiac pacemaker    Medtronic  . Seizures ([place])     Past Surgical History:  Procedure Laterality Date  . KNEE SURGERY Left   . LEFT HEART CATH AND CORONARY ANGIOGRAPHY Left 06/29/2017   Procedure: LEFT HEART CATH AND CORONARY ANGIOGRAPHY;  Surgeon: [name], MD;  Location: [place] [facility];  Service: Cardiovascular;  Laterality: Left;  . LYMPH NODE DISSECTION Left 04/16/2019   Procedure: Left inguinal Lymph Node Dissection;  Surgeon: [name], MD;  Location: [place];  Service: General;  Laterality: Left;  Marland Kitchen MELANOMA EXCISION Left 04/16/2019   Procedure: MELANOMA EXCISION LEFT GROIN MASS;  Surgeon: [name], MD;  Location: [place];  Service: General;  Laterality: Left;  Marland Kitchen MELANOMA EXCISION WITH SENTINEL LYMPH NODE BIOPSY Left 2012   Left calf   . PACEMAKER INSERTION N/A 08/26/2018   Procedure: INSERTION PACEMAKER;  Surgeon: [name], MD;  Location: [facility];  Service: Cardiovascular;  Laterality: N/A;  . TEMPORARY PACEMAKER N/A 08/25/2018   Procedure: TEMPORARY  PACEMAKER;  Surgeon: [name], MD;  Location: [place] [facility];  Service: Cardiovascular;  Laterality: N/A;    There were no vitals filed for this visit.  Subjective Assessment - 08/13/19 1628    Subjective   Edward Pitts presents to OT for visit 9/36 to address LLE lymphedema 2/2 melanoma Rx. Pt presents without compression wraps in place. Pt has no new complaints. Pain not numerically rated today.    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 with surgical excision w/ LN disection (-7/7 LN); Completed adjuvant XRT 07/07/2019; 08/2018 Pacemaker placed; L knee sx date?;    Limitations  chronic LLE knee pain; post surgical LLE pain,  chronic LLE/LLQ swelling, decreased LLE strength, impaired gait, decreased dynamic balance, altered sensation LLE, increased risk of infection, decreased standing and walking tolerance > 1 hr; decreased ankle AROM    Repetition  Increases Symptoms    Special Tests  +Stemmer base L toes    Patient Stated Goals  reduce swelling to normal and keep it from getting worse    Pain Onset  Other (comment)   post surgical pain onset 1/21; L knee pain chronic                  OT Treatments/Exercises (OP) - 08/13/19 0001      ADLs   ADL Education Given  Yes  Manual Therapy   Manual Therapy  Edema management;Manual Lymphatic Drainage (MLD);Compression Bandaging    Edema Management  fibrosis techniques and MR to L ankle , distal leg and foot    Manual Lymphatic Drainage (MLD)  MLDto LLE/LLQ in supine and sidelying utilizing short neck sequence, deep abdominals with diaphragmatic breathing, ipsilateral inguinal -axiillary anastomosis and proximal to distal leg sequence.     Compression Bandaging  thigh length multi layer gradient compression wraps to LLE- short stretch             OT Education - 08/13/19 1628    Education Details  Continued skilled Pt/caregiver education  And LE ADL training throughout  visit for lymphedema self care/ home program, including compression wrapping, compression garment and device wear/care, lymphatic pumping ther ex, simple self-MLD, and skin care. Discussed progress towards goals.    Person(s) Educated  Patient;Spouse    Methods  Explanation;Demonstration;Handout;Tactile cues;Verbal cues    Comprehension  Verbalized understanding;Returned demonstration;Need further instruction;Verbal cues required;Tactile cues required          OT Long Term Goals - 07/24/19 1729      OT LONG TERM GOAL #1   Title  Pt will be able to apply thigh length, multi-layer, short stretch compression wraps daily to single limb using correct gradient techniques with modified independence (extra time) to achieve optimal limb volume reduction, to return affected limb/s, as closely as possible, to premorbid size and shape, to limit infection risk, and to improve safe functional ambulation and mobility.    Baseline  Max A    Time  4    Period  Days    Status  New    Target Date  --   4th OT Rx visit     OT LONG TERM GOAL #2   Title  Pt will be able to verbalize signs and symptoms of cellulitis infection and identify at least 4 common lymphedema precautions using printed resource for reference to limit LE progression over time to limit risk of infection and LE exacerbation.    Baseline  Max A    Time  4    Period  Days    Target Date  --   4th OT Rx visit     OT LONG TERM GOAL #3   Title  Pt will tolerate thigh length, multilayer short stretch compression wrap to single leg up to 23/ 7 to achieve optimal swelling reduction to arrest lymphedema progression and in preparation for fitting appropriate compression garments/ devices.    Baseline  Max A    Time  12    Period  Weeks    Status  New    Target Date  10/22/19      OT LONG TERM GOAL #4   Title  Pt will achieve 10% or greater limb volume reduction to LLE from toes to groin to enable functional improvements including fitting  into preferred footwear and clothing, decreased infection risk and improved body image.    Baseline  Max A    Time  12    Period  Weeks    Status  New    Target Date  10/22/19      OT LONG TERM GOAL #5   Title  Pt will achieve and sustain at least 85%  compliance with all daily LE self-care home program components, to include skin care, lymphatic pumping therex, compression wraps and garments/devices, and simple self-MLD, to reduce and control limb swelling, reduce infection risk and limit LE  progression.    Baseline  Max A    Time  12    Period  Weeks    Status  New    Target Date  10/22/19      Long Term Additional Goals   Additional Long Term Goals  Yes      OT LONG TERM GOAL #6   Title  Pt will achieve modified independence ( extra time, assistive device) with donning and doffing day and night time compression garments and devices to limit re-accumulation of lymphatic congestion and progression of lymphedema.    Baseline  Max A    Time  12    Period  Weeks    Status  New    Target Date  10/22/19            Plan - 08/13/19 1630    Clinical Impression Statement  MLD to LLE and LLQ directing flow towards deep abdominal pathways and ipsilateral inguinal - axillary anastamosis. Applied fibrosis techniques and myofasial release to decrease protien ritch ddensity limiting skin fllexibility and contributing to progressive induration. Limb volume visibly decreased and tissue density palpable reduced by end of session. Cont as per POC. Comple comparative limb volumetrics next session.    OT Occupational Profile and History  Comprehensive Assessment- Review of records and extensive additional review of physical, cognitive, psychosocial history related to current functional performance    Occupational performance deficits (Please refer to evaluation for details):  ADL's;IADL's;Rest and Sleep;Work;Leisure;Social Participation;Other   body image, role performance   Body Structure /  Function / Physical Skills  ADL;ROM;Scar mobility;IADL;Edema;Balance;Sensation;Skin integrity;Mobility;Flexibility;Strength;Decreased knowledge of precautions;Gait;Pain;Decreased knowledge of use of DME    Rehab Potential  Good    Clinical Decision Making  Several treatment options, min-mod task modification necessary    Comorbidities Affecting Occupational Performance:  Presence of comorbidities impacting occupational performance    Modification or Assistance to Complete Evaluation   Min-Moderate modification of tasks or assist with assess necessary to complete eval    OT Frequency  2x / week    OT Duration  12 weeks   and PRN   OT Treatment/Interventions  Self-care/ADL training;Therapeutic exercise;Functional Mobility Training;Manual Therapy;Coping strategies training;Therapeutic activities;Manual lymph drainage;Energy conservation;DME and/or AE instruction;Compression bandaging;Other (comment);Scar mobilization;Patient/family education   fit with appropriate compression garments once swelling is reduced   Plan  Complete Decongestive Therapy (CDT), Intensive and Management Phases to include  manual lymphatyic drainage (MLD), skin care, therapeutic exercise, compression with short stretch wraps and garments, Pt edu for LE sef care    Consulted and Agree with Plan of Care  Patient       Patient will benefit from skilled therapeutic intervention in order to improve the following deficits and impairments:   Body Structure / Function / Physical Skills: ADL, ROM, Scar mobility, IADL, Edema, Balance, Sensation, Skin integrity, Mobility, Flexibility, Strength, Decreased knowledge of precautions, Gait, Pain, Decreased knowledge of use of DME       Visit Diagnosis: Lymphedema, not elsewhere classified    Problem List Patient Active Problem List   Diagnosis Date Noted  . Encounter for antineoplastic immunotherapy 08/06/2019  . Malignant melanoma of overlapping sites ([place]) 04/23/2019  . Goals of  care, counseling/discussion 04/23/2019  . 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 Near syncope 12/17/2018  . Anemia in chronic kidney disease 12/11/2018  . Benign hypertensive kidney disease with chronic kidney disease 12/11/2018  . Stage 3a chronic kidney disease 12/11/2018  . AKI (acute kidney injury) ([place]) 08/24/2018  .  Acute hyperkalemia 08/24/2018  . HTN (hypertension) 08/24/2018  . HLD (hyperlipidemia) 08/24/2018  . Seizures ([place]) 08/24/2018  . Complete heart block ([place]) 08/24/2018  . Arthritis of wrist, left 06/12/2018  . Bradycardia 06/04/2018  . 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 Positive ANA (antinuclear antibody) 03/05/2018  . Swelling of joint of left wrist 03/05/2018  . Coronary artery disease involving native coronary artery of native heart 07/09/2017  . Palpitations 07/09/2017  . Stable angina ([place]) 06/27/2017  . Benign essential HTN 05/25/2017  . LBBB (left bundle branch block) 05/25/2017    Andrey Spearman, MS, OTR/L, [facility] 08/13/19 4:33 PM   [place] MAIN [facility] SERVICES [address] [place], [place], [address] Phone: [phone]   Fax:  [phone]  Name: Eyasu Jiles MRN: [id number] Date of Birth: Jan 05, 1961

## 2019-08-13 NOTE — Progress Notes (Signed)

## 2019-08-14 ENCOUNTER — Encounter: Payer: BC Managed Care – PPO | Admitting: Physical Therapy

## 2019-08-14 ENCOUNTER — Other Ambulatory Visit: Payer: Self-pay

## 2019-08-14 ENCOUNTER — Ambulatory Visit: Payer: BC Managed Care – PPO | Admitting: Occupational Therapy

## 2019-08-14 DIAGNOSIS — I890 Lymphedema, not elsewhere classified: Secondary | ICD-10-CM

## 2019-08-14 NOTE — Therapy (Signed)
[place] MAIN [facility] SERVICES [address] [place], [place], [address] Phone: [phone]   Fax:  [phone]  Occupational Therapy Treatment Note and Progress Report: Lymphedema Care  Patient Details  Name: Edward Pitts MRN: [id number] Date of Birth: 03-02-1961 Referring Provider (OT): [name], MD   Encounter Date: 08/14/2019  OT End of Session - 08/14/19 1621    Visit Number  10    Number of Visits  36    Date for OT Re-Evaluation  10/22/19    OT Start Time  0207    OT Stop Time  0310    OT Time Calculation (min)  63 min    Activity Tolerance  Patient tolerated treatment well;No increased pain       Past Medical History:  Diagnosis Date  . Anxiety   . Arthritis   . Complication of anesthesia   . Family history of adverse reaction to anesthesia    PONV mother  . Hyperlipidemia   . Hypertension   . Melanoma ([place]) 2012  . PONV (postoperative nausea and vomiting) 04/16/2019  . Presence of permanent cardiac pacemaker    Medtronic  . Seizures ([place])     Past Surgical History:  Procedure Laterality Date  . KNEE SURGERY Left   . LEFT HEART CATH AND CORONARY ANGIOGRAPHY Left 06/29/2017   Procedure: LEFT HEART CATH AND CORONARY ANGIOGRAPHY;  Surgeon: [name], MD;  Location: [place] [facility];  Service: Cardiovascular;  Laterality: Left;  . LYMPH NODE DISSECTION Left 04/16/2019   Procedure: Left inguinal Lymph Node Dissection;  Surgeon: [name], MD;  Location: [place];  Service: General;  Laterality: Left;  Marland Kitchen MELANOMA EXCISION Left 04/16/2019   Procedure: MELANOMA EXCISION LEFT GROIN MASS;  Surgeon: [name], MD;  Location: [place];  Service: General;  Laterality: Left;  Marland Kitchen MELANOMA EXCISION WITH SENTINEL LYMPH NODE BIOPSY Left 2012   Left calf   . PACEMAKER INSERTION N/A 08/26/2018   Procedure: INSERTION PACEMAKER;  Surgeon: [name], MD;  Location: [facility];  Service: Cardiovascular;  Laterality: N/A;  .  TEMPORARY PACEMAKER N/A 08/25/2018   Procedure: TEMPORARY PACEMAKER;  Surgeon: [name], MD;  Location: [place] [facility];  Service: Cardiovascular;  Laterality: N/A;    There were no vitals filed for this visit.       LYMPHEDEMA/ONCOLOGY QUESTIONNAIRE - 08/14/19 1622      Left Lower Extremity Lymphedema   Other  LLE A-D volume =3991.3 ml. RLE E-G volume = 7110.28m. LLE A-G volume = 11101.44 ml.    Other  LLE leg volume   is reduced by 11.91% since initially easured on 07/28/19. LLE thigh volume is increased by 2.9%; and LLE full leg volume (A-G) is decreased by  3# since commening OT for LLE CDT,              OT Treatments/Exercises (OP) - 08/14/19 0001      ADLs   ADL Education Given  Yes      Manual Therapy   Manual Therapy  Edema management;Manual Lymphatic Drainage (MLD);Compression Bandaging    Manual therapy comments  LLE comparative limb volumetrics    Manual Lymphatic Drainage (MLD)  MLDto LLE/LLQ in supine and sidelying utilizing short neck sequence, deep abdominals with diaphragmatic breathing, ipsilateral inguinal -axiillary anastomosis and proximal to distal leg sequence.     Compression Bandaging  thigh length multi layer gradient compression wraps to LLE- short stretch  OT Long Term Goals - 08/14/19 1627      OT LONG TERM GOAL #1   Title  Pt will be able to apply thigh length, multi-layer, short stretch compression wraps daily to single limb using correct gradient techniques with modified independence (extra time) to achieve optimal limb volume reduction, to return affected limb/s, as closely as possible, to premorbid size and shape, to limit infection risk, and to improve safe functional ambulation and mobility.   Pt able to apply gradient wraps with max CG assistance   Baseline  Max A    Time  4    Period  Days    Status  Achieved      OT LONG TERM GOAL #2   Title  Pt will be able to verbalize signs and symptoms of  cellulitis infection and identify at least 4 common lymphedema precautions using printed resource for reference to limit LE progression over time to limit risk of infection and LE exacerbation.    Baseline  Max A    Time  4    Period  Days    Status  Partially Met      OT LONG TERM GOAL #3   Title  Pt will tolerate thigh length, multilayer short stretch compression wrap to single leg up to 23/ 7 to achieve optimal swelling reduction to arrest lymphedema progression and in preparation for fitting appropriate compression garments/ devices.    Baseline  Max A    Time  12    Period  Weeks    Status  Partially Met   Pt achieved ~50% compliance with compression wraps thus far.     OT LONG TERM GOAL #4   Title  Pt will achieve 10% or greater limb volume reduction to LLE from toes to groin to enable functional improvements including fitting into preferred footwear and clothing, decreased infection risk and improved body image.    Baseline  Max A    Time  12    Period  Weeks    Status  Partially Met   11% LLE limb volume reduction below the knee achieved 08/14/19. Overall limb volume reduction to date = 3%     OT LONG TERM GOAL #5   Title  Pt will achieve and sustain at least 85%  compliance with all daily LE self-care home program components, to include skin care, lymphatic pumping therex, compression wraps and garments/devices, and simple self-MLD, to reduce and control limb swelling, reduce infection risk and limit LE progression.    Baseline  Max A    Time  12    Period  Weeks    Status  Partially Met      OT LONG TERM GOAL #6   Title  Pt will achieve modified independence ( extra time, assistive device) with donning and doffing day and night time compression garments and devices to limit re-accumulation of lymphatic congestion and progression of lymphedema.    Baseline  Max A    Time  12    Period  Weeks    Status  On-going            Plan - 08/14/19 1631    Clinical Impression  Statement  LLE leg volume   is reduced by 11.91% since initially easured on 07/28/19. LLE thigh volume is increased by 2.9%; and LLE full leg volume (A-G) is decreased by  3% since commening OT for LLE CDT. Rever to Long Term Goals section for detailed progress report. Provided MLD  to LLE and LLQ directing flow towards deep abdominal pathways and ipsilateral inguinal - axillary anastamosis. Applied fibrosis techniques and myofasial release to decrease protien ritch density    OT Occupational Profile and History  Comprehensive Assessment- Review of records and extensive additional review of physical, cognitive, psychosocial history related to current functional performance    Occupational performance deficits (Please refer to evaluation for details):  ADL's;IADL's;Rest and Sleep;Work;Leisure;Social Participation;Other   body image, role performance   Body Structure / Function / Physical Skills  ADL;ROM;Scar mobility;IADL;Edema;Balance;Sensation;Skin integrity;Mobility;Flexibility;Strength;Decreased knowledge of precautions;Gait;Pain;Decreased knowledge of use of DME    Rehab Potential  Good    Clinical Decision Making  Several treatment options, min-mod task modification necessary    Comorbidities Affecting Occupational Performance:  Presence of comorbidities impacting occupational performance    Modification or Assistance to Complete Evaluation   Min-Moderate modification of tasks or assist with assess necessary to complete eval    OT Frequency  2x / week    OT Duration  12 weeks   and PRN   OT Treatment/Interventions  Self-care/ADL training;Therapeutic exercise;Functional Mobility Training;Manual Therapy;Coping strategies training;Therapeutic activities;Manual lymph drainage;Energy conservation;DME and/or AE instruction;Compression bandaging;Other (comment);Scar mobilization;Patient/family education   fit with appropriate compression garments once swelling is reduced   Plan  Complete Decongestive  Therapy (CDT), Intensive and Management Phases to include  manual lymphatyic drainage (MLD), skin care, therapeutic exercise, compression with short stretch wraps and garments, Pt edu for LE sef care    Consulted and Agree with Plan of Care  Patient       Patient will benefit from skilled therapeutic intervention in order to improve the following deficits and impairments:   Body Structure / Function / Physical Skills: ADL, ROM, Scar mobility, IADL, Edema, Balance, Sensation, Skin integrity, Mobility, Flexibility, Strength, Decreased knowledge of precautions, Gait, Pain, Decreased knowledge of use of DME       Visit Diagnosis: Lymphedema, not elsewhere classified    Problem List Patient Active Problem List   Diagnosis Date Noted  . Encounter for antineoplastic immunotherapy 08/06/2019  . Malignant melanoma of overlapping sites ([place]) 04/23/2019  . Goals of care, counseling/discussion 04/23/2019  . 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 Near syncope 12/17/2018  . Anemia in chronic kidney disease 12/11/2018  . Benign hypertensive kidney disease with chronic kidney disease 12/11/2018  . Stage 3a chronic kidney disease 12/11/2018  . AKI (acute kidney injury) ([place]) 08/24/2018  . Acute hyperkalemia 08/24/2018  . HTN (hypertension) 08/24/2018  . HLD (hyperlipidemia) 08/24/2018  . Seizures ([place]) 08/24/2018  . Complete heart block ([place]) 08/24/2018  . Arthritis of wrist, left 06/12/2018  . Bradycardia 06/04/2018  . 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 Positive ANA (antinuclear antibody) 03/05/2018  . Swelling of joint of left wrist 03/05/2018  . Coronary artery disease involving native coronary artery of native heart 07/09/2017  . Palpitations 07/09/2017  . Stable angina ([place]) 06/27/2017  . Benign essential HTN 05/25/2017  . LBBB (left bundle branch block) 05/25/2017    Andrey Spearman, MS, OTR/L, [facility] 08/14/19 4:32 PM\    [place] MAIN [facility] SERVICES [address] [place], [place], [address] Phone: [phone]   Fax:  [phone]  Name: Edward Pitts MRN: [id number] Date of Birth: [birth year]

## 2019-08-18 ENCOUNTER — Telehealth: Payer: Self-pay | Admitting: Neurology

## 2019-08-18 ENCOUNTER — Other Ambulatory Visit: Payer: Self-pay | Admitting: Neurology

## 2019-08-18 MED ORDER — CLONAZEPAM 0.5 MG PO TABS: ORAL_TABLET | ORAL | 3 refills | Status: DC

## 2019-08-18 NOTE — Telephone Encounter (Signed)
Error

## 2019-08-18 NOTE — Telephone Encounter (Signed)
A prescription for clonazepam was sent in.

## 2019-08-18 NOTE — Telephone Encounter (Signed)
Pt came by today requesting refill on his Clonazepam. Please advise.

## 2019-08-18 NOTE — Telephone Encounter (Signed)
Drug registry check last refill May 26, 2019.

## 2019-08-19 ENCOUNTER — Other Ambulatory Visit: Payer: Self-pay

## 2019-08-19 ENCOUNTER — Ambulatory Visit: Payer: BC Managed Care – PPO | Admitting: Occupational Therapy

## 2019-08-19 ENCOUNTER — Ambulatory Visit: Payer: BC Managed Care – PPO | Admitting: Physical Therapy

## 2019-08-19 ENCOUNTER — Encounter: Payer: Self-pay | Admitting: Oncology

## 2019-08-19 DIAGNOSIS — I890 Lymphedema, not elsewhere classified: Secondary | ICD-10-CM

## 2019-08-19 NOTE — Progress Notes (Signed)
Patient contacted for appointment. Pt will like to discuss the option of port a cath.

## 2019-08-19 NOTE — Therapy (Signed)
[place] MAIN [place] SERVICES [address] [place], [place], [address] Phone: [phone]   Fax:  [phone]  Occupational Therapy Treatment  Patient Details  Name: Edward Pitts MRN: [id number] Date of Birth: [birth year] Referring Provider (OT): [name], MD   Encounter Date: 08/19/2019  OT End of Session - 08/19/19 1638    Visit Number  11    Number of Visits  36    Date for OT Re-Evaluation  10/22/19    OT Start Time  0102    OT Stop Time  0202    OT Time Calculation (min)  60 min    Activity Tolerance  Patient tolerated treatment well;No increased pain       Past Medical History:  Diagnosis Date  . Anxiety   . Arthritis   . Complication of anesthesia   . Family history of adverse reaction to anesthesia    PONV mother  . Hyperlipidemia   . Hypertension   . Melanoma ([place]) 2012  . PONV (postoperative nausea and vomiting) 04/16/2019  . Presence of permanent cardiac pacemaker    Medtronic  . Seizures ([place])     Past Surgical History:  Procedure Laterality Date  . KNEE SURGERY Left   . LEFT HEART CATH AND CORONARY ANGIOGRAPHY Left 06/29/2017   Procedure: LEFT HEART CATH AND CORONARY ANGIOGRAPHY;  Surgeon: [name], MD;  Location: [place] [facility];  Service: Cardiovascular;  Laterality: Left;  . LYMPH NODE DISSECTION Left 04/16/2019   Procedure: Left inguinal Lymph Node Dissection;  Surgeon: [name], MD;  Location: [place];  Service: General;  Laterality: Left;  Marland Kitchen MELANOMA EXCISION Left 04/16/2019   Procedure: MELANOMA EXCISION LEFT GROIN MASS;  Surgeon: [name], MD;  Location: [place];  Service: General;  Laterality: Left;  Marland Kitchen MELANOMA EXCISION WITH SENTINEL LYMPH NODE BIOPSY Left 2012   Left calf   . PACEMAKER INSERTION N/A 08/26/2018   Procedure: INSERTION PACEMAKER;  Surgeon: [name], MD;  Location: [facility];  Service: Cardiovascular;  Laterality: N/A;  . TEMPORARY PACEMAKER N/A 08/25/2018    Procedure: TEMPORARY PACEMAKER;  Surgeon: [name], MD;  Location: [place] [facility];  Service: Cardiovascular;  Laterality: N/A;    There were no vitals filed for this visit.  Subjective Assessment - 08/19/19 1305    Subjective   Edward Pitts presents to OT for visit 10/36 to address LLE lymphedema 2/2 melanoma Rx. Pt presents with thigh high compression wraps in place. Pt has no new complaints. Pt denies leg pain. "I didn't wrap it yesterday, and "it swelled a whole lot because I was on it all day. 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 with surgical excision w/ LN disection (-7/7 LN); Completed adjuvant XRT 07/07/2019; 08/2018 Pacemaker placed; L knee sx date?;    Limitations  chronic LLE knee pain; post surgical LLE pain,  chronic LLE/LLQ swelling, decreased LLE strength, impaired gait, decreased dynamic balance, altered sensation LLE, increased risk of infection, decreased standing and walking tolerance > 1 hr; decreased ankle AROM    Repetition  Increases Symptoms    Special Tests  +Stemmer base L toes    Patient Stated Goals  reduce swelling to normal and keep it from getting worse    Pain Onset  Other (comment)   post surgical pain onset 1/21; L knee pain chronic  OT Treatments/Exercises (OP) - 08/19/19 0001      ADLs   ADL Education Given  Yes  (Pended)       Manual Therapy   Manual Therapy  Edema management;Manual Lymphatic Drainage (MLD);Compression Bandaging  (Pended)     Manual Lymphatic Drainage (MLD)  MLDto LLE/LLQ in supine and sidelying utilizing short neck sequence, deep abdominals with diaphragmatic breathing, ipsilateral inguinal -axiillary anastomosis and proximal to distal leg sequence.   (Pended)     Compression Bandaging  thigh length multi layer gradient compression wraps to LLE- short stretch  (Pended)              OT Education -  08/19/19 1635    Education Details  Pilar Plate discussion regarding importance of following compression protocol for optimal therapeutic outcome. Pt continues to have difficulty with this aspect of self care during visit intervals.    Person(s) Educated  Patient;Spouse    Methods  Explanation;Demonstration;Handout;Tactile cues;Verbal cues    Comprehension  Verbalized understanding;Returned demonstration;Need further instruction;Verbal cues required;Tactile cues required          OT Long Term Goals - 08/14/19 1627      OT LONG TERM GOAL #1   Title  Pt will be able to apply thigh length, multi-layer, short stretch compression wraps daily to single limb using correct gradient techniques with modified independence (extra time) to achieve optimal limb volume reduction, to return affected limb/s, as closely as possible, to premorbid size and shape, to limit infection risk, and to improve safe functional ambulation and mobility.   Pt able to apply gradient wraps with max CG assistance   Baseline  Max A    Time  4    Period  Days    Status  Achieved      OT LONG TERM GOAL #2   Title  Pt will be able to verbalize signs and symptoms of cellulitis infection and identify at least 4 common lymphedema precautions using printed resource for reference to limit LE progression over time to limit risk of infection and LE exacerbation.    Baseline  Max A    Time  4    Period  Days    Status  Partially Met      OT LONG TERM GOAL #3   Title  Pt will tolerate thigh length, multilayer short stretch compression wrap to single leg up to 23/ 7 to achieve optimal swelling reduction to arrest lymphedema progression and in preparation for fitting appropriate compression garments/ devices.    Baseline  Max A    Time  12    Period  Weeks    Status  Partially Met   Pt achieved ~50% compliance with compression wraps thus far.     OT LONG TERM GOAL #4   Title  Pt will achieve 10% or greater limb volume reduction to LLE  from toes to groin to enable functional improvements including fitting into preferred footwear and clothing, decreased infection risk and improved body image.    Baseline  Max A    Time  12    Period  Weeks    Status  Partially Met   11% LLE limb volume reduction below the knee achieved 08/14/19. Overall limb volume reduction to date = 3%     OT LONG TERM GOAL #5   Title  Pt will achieve and sustain at least 85%  compliance with all daily LE self-care home program components, to include skin care, lymphatic pumping therex, compression wraps  and garments/devices, and simple self-MLD, to reduce and control limb swelling, reduce infection risk and limit LE progression.    Baseline  Max A    Time  12    Period  Weeks    Status  Partially Met      OT LONG TERM GOAL #6   Title  Pt will achieve modified independence ( extra time, assistive device) with donning and doffing day and night time compression garments and devices to limit re-accumulation of lymphatic congestion and progression of lymphedema.    Baseline  Max A    Time  12    Period  Weeks    Status  On-going            Plan - 08/19/19 1638    Clinical Impression Statement  Pt continues to have difficulty with compression wrapping between visits. Thigh length wrap provides inadequate compression today and because too few wraps and incorrect technique was used ( not gradient as taught) leg swelling is increased in distal leg and foot today. Pt encouraged once again to remain in wraps 23/7 beween OT visits . OT suggested spouse return for retraining to ensure correct gradient technique is     used. Deep fibrosis work on L ankle , foot and distal leg today reduced pitting edema distally with obvious reuction observed after manual therapy. Because only 2 wraps and one roll of Rosidal foam are available, we applied knee length wrap only instead of needed thigh length compression. Cont as per POC..    OT Occupational Profile and History   Comprehensive Assessment- Review of records and extensive additional review of physical, cognitive, psychosocial history related to current functional performance    Occupational performance deficits (Please refer to evaluation for details):  ADL's;IADL's;Rest and Sleep;Work;Leisure;Social Participation;Other   body image, role performance   Body Structure / Function / Physical Skills  ADL;ROM;Scar mobility;IADL;Edema;Balance;Sensation;Skin integrity;Mobility;Flexibility;Strength;Decreased knowledge of precautions;Gait;Pain;Decreased knowledge of use of DME    Rehab Potential  Good    Clinical Decision Making  Several treatment options, min-mod task modification necessary    Comorbidities Affecting Occupational Performance:  Presence of comorbidities impacting occupational performance    Modification or Assistance to Complete Evaluation   Min-Moderate modification of tasks or assist with assess necessary to complete eval    OT Frequency  2x / week    OT Duration  12 weeks   and PRN   OT Treatment/Interventions  Self-care/ADL training;Therapeutic exercise;Functional Mobility Training;Manual Therapy;Coping strategies training;Therapeutic activities;Manual lymph drainage;Energy conservation;DME and/or AE instruction;Compression bandaging;Other (comment);Scar mobilization;Patient/family education   fit with appropriate compression garments once swelling is reduced   Plan  Complete Decongestive Therapy (CDT), Intensive and Management Phases to include  manual lymphatyic drainage (MLD), skin care, therapeutic exercise, compression with short stretch wraps and garments, Pt edu for LE sef care    Consulted and Agree with Plan of Care  Patient       Patient will benefit from skilled therapeutic intervention in order to improve the following deficits and impairments:   Body Structure / Function / Physical Skills: ADL, ROM, Scar mobility, IADL, Edema, Balance, Sensation, Skin integrity, Mobility,  Flexibility, Strength, Decreased knowledge of precautions, Gait, Pain, Decreased knowledge of use of DME       Visit Diagnosis: Lymphedema, not elsewhere classified    Problem List Patient Active Problem List   Diagnosis Date Noted  . Encounter for antineoplastic immunotherapy 08/06/2019  . Malignant melanoma of overlapping sites ([place]) 04/23/2019  . Goals of care, counseling/discussion  04/23/2019  . 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 Near syncope 12/17/2018  . Anemia in chronic kidney disease 12/11/2018  . Benign hypertensive kidney disease with chronic kidney disease 12/11/2018  . Stage 3a chronic kidney disease 12/11/2018  . AKI (acute kidney injury) ([place]) 08/24/2018  . Acute hyperkalemia 08/24/2018  . HTN (hypertension) 08/24/2018  . HLD (hyperlipidemia) 08/24/2018  . Seizures ([place]) 08/24/2018  . Complete heart block ([place]) 08/24/2018  . Arthritis of wrist, left 06/12/2018  . Bradycardia 06/04/2018  . 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 Positive ANA (antinuclear antibody) 03/05/2018  . Swelling of joint of left wrist 03/05/2018  . Coronary artery disease involving native coronary artery of native heart 07/09/2017  . Palpitations 07/09/2017  . Stable angina ([place]) 06/27/2017  . Benign essential HTN 05/25/2017  . LBBB (left bundle branch block) 05/25/2017    Andrey Spearman, MS, OTR/L, [facility] 08/19/19 4:44 PM  [place] MAIN [facility] SERVICES [address] [place], [place], [address] Phone: [phone]   Fax:  [phone]  Name: Edward Pitts MRN: [id number] Date of Birth: 1961-02-29

## 2019-08-20 ENCOUNTER — Other Ambulatory Visit: Payer: Self-pay

## 2019-08-20 ENCOUNTER — Inpatient Hospital Stay: Payer: BC Managed Care – PPO

## 2019-08-20 ENCOUNTER — Telehealth (INDEPENDENT_AMBULATORY_CARE_PROVIDER_SITE_OTHER): Payer: Self-pay

## 2019-08-20 ENCOUNTER — Ambulatory Visit: Payer: BC Managed Care – PPO | Admitting: Occupational Therapy

## 2019-08-20 ENCOUNTER — Inpatient Hospital Stay (HOSPITAL_BASED_OUTPATIENT_CLINIC_OR_DEPARTMENT_OTHER): Payer: BC Managed Care – PPO | Admitting: Oncology

## 2019-08-20 VITALS — BP 129/73 | HR 79 | Temp 99.1°F | Resp 18 | Wt 227.0 lb

## 2019-08-20 VITALS — BP 127/66 | HR 59 | Resp 16

## 2019-08-20 DIAGNOSIS — C438 Malignant melanoma of overlapping sites of skin: Secondary | ICD-10-CM

## 2019-08-20 DIAGNOSIS — Z5112 Encounter for antineoplastic immunotherapy: Secondary | ICD-10-CM

## 2019-08-20 DIAGNOSIS — L80 Vitiligo: Secondary | ICD-10-CM

## 2019-08-20 DIAGNOSIS — Z95828 Presence of other vascular implants and grafts: Secondary | ICD-10-CM | POA: Insufficient documentation

## 2019-08-20 LAB — CBC WITH DIFFERENTIAL/PLATELET
Abs Immature Granulocytes: 0.03 10*3/uL (ref 0.00–0.07)
Basophils Absolute: 0 10*3/uL (ref 0.0–0.1)
Basophils Relative: 1 %
Eosinophils Absolute: 0.4 10*3/uL (ref 0.0–0.5)
Eosinophils Relative: 9 %
HCT: 35.8 % — ABNORMAL LOW (ref 39.0–52.0)
Hemoglobin: 12.1 g/dL — ABNORMAL LOW (ref 13.0–17.0)
Immature Granulocytes: 1 %
Lymphocytes Relative: 17 %
Lymphs Abs: 0.7 10*3/uL (ref 0.7–4.0)
MCH: 29.6 pg (ref 26.0–34.0)
MCHC: 33.8 g/dL (ref 30.0–36.0)
MCV: 87.5 fL (ref 80.0–100.0)
Monocytes Absolute: 0.4 10*3/uL (ref 0.1–1.0)
Monocytes Relative: 10 %
Neutro Abs: 2.6 10*3/uL (ref 1.7–7.7)
Neutrophils Relative %: 62 %
Platelets: 199 10*3/uL (ref 150–400)
RBC: 4.09 MIL/uL — ABNORMAL LOW (ref 4.22–5.81)
RDW: 13.4 % (ref 11.5–15.5)
WBC: 4.2 10*3/uL (ref 4.0–10.5)
nRBC: 0 % (ref 0.0–0.2)

## 2019-08-20 LAB — COMPREHENSIVE METABOLIC PANEL
ALT: 33 U/L (ref 0–44)
AST: 25 U/L (ref 15–41)
Albumin: 4.3 g/dL (ref 3.5–5.0)
Alkaline Phosphatase: 99 U/L (ref 38–126)
Anion gap: 7 (ref 5–15)
BUN: 20 mg/dL (ref 6–20)
CO2: 26 mmol/L (ref 22–32)
Calcium: 9.1 mg/dL (ref 8.9–10.3)
Chloride: 105 mmol/L (ref 98–111)
Creatinine, Ser: 1.29 mg/dL — ABNORMAL HIGH (ref 0.61–1.24)
GFR calc Af Amer: >60 mL/min (ref >60)
GFR calc non Af Amer: >60 mL/min (ref >60)
Glucose, Bld: 107 mg/dL — ABNORMAL HIGH (ref 70–99)
Potassium: 4.5 mmol/L (ref 3.5–5.1)
Sodium: 138 mmol/L (ref 135–145)
Total Bilirubin: 0.6 mg/dL (ref 0.3–1.2)
Total Protein: 7.6 g/dL (ref 6.5–8.1)

## 2019-08-20 LAB — IRON AND TIBC
Iron: 59 ug/dL (ref 45–182)
Saturation Ratios: 18 % (ref 17.9–39.5)
TIBC: 336 ug/dL (ref 250–450)
UIBC: 277 ug/dL

## 2019-08-20 LAB — VITAMIN B12: Vitamin B-12: 458 pg/mL (ref 180–914)

## 2019-08-20 LAB — FERRITIN: Ferritin: 55 ng/mL (ref 24–336)

## 2019-08-20 LAB — FOLATE: Folate: 37 ng/mL (ref >5.9)

## 2019-08-20 MED ORDER — SODIUM CHLORIDE 0.9 % IV SOLN
Freq: Once | INTRAVENOUS | Status: AC
  Filled 2019-08-20: qty 250

## 2019-08-20 MED ORDER — SODIUM CHLORIDE 0.9 % IV SOLN
240.0000 mg | Freq: Once | INTRAVENOUS | Status: AC
  Administered 2019-08-20: 240 mg via INTRAVENOUS
  Filled 2019-08-20: qty 4

## 2019-08-20 NOTE — Telephone Encounter (Signed)
Spoke with the patient and he is now scheduled with Dr. Delana Meyer for a port placement on 08/26/19 with a 9:00 am arrival time to the [facility]. Patient will do covid testing on 08/22/19 between 8-1 pm at the [facility]. Pre-procedure instructions were discussed and will be mailed to the patient.

## 2019-08-20 NOTE — Progress Notes (Signed)
Hematology/Oncology follow up  note Edward Pitts Telephone:([phone] [phone]   Patient Care Team: [name], [name], MD as PCP - General (Student)  REFERRING PROVIDER: Cherylann Parr, MD  CHIEF COMPLAINTS/REASON FOR VISIT:  Follow up for melanoma HISTORY OF PRESENTING ILLNESS:   Edward Pitts is a  59 y.o.  male with PMH listed below was seen in consultation at the request of  [name], [name], MD  for evaluation of inguinal mass Patient presented to emergency room 3 days ago complaining about left inguinal mass discomfort. Reports that he has really noticed the mass growing for the past 1 months. He has a history of left lower extremity melanoma in 2011, status post local excision.  Pain was increased with squatting of laxation. He was advised to take Tylenol for pain. Denies any fever, chills, night sweating.  He does feel mild nauseated. Appetite is fair.  He has lost about 10 pounds since earlier this year. 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  Left inguinal and left iliac nodes which are suspicious for involvement.  There are also 2 small nonspecific hypodense lesions within the right liver, nonspecific.  # patient underwent left groin mass resection On 04/16/2019. Resection pathology showed malignant melanoma, replacing a lymph node, with extracapsular extension, peripheral and deep margins involved.  Left inguinal contents, all 7 lymph nodes were negative for melanoma in the lymph nodes.  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  # PDL1 80% TPS  # 04/16/2019. underwent left groin mass resection   07/07/2019  Status post adjuvant radiation and finished radiation   INTERVAL HISTORY Edward Pitts is a 59 y.o. male who has above history reviewed by me today presents for follow up visit for management of  inguinal nodal recurrence of melanoma Problems and complaints are listed below: Patient tolerates immunotherapy well.  He has no new complaints he wants to have Mediport placed to facilitate his treatments. . Review of Systems  Constitutional: Negative for appetite change, chills, fatigue, fever and unexpected weight change.  HENT:   Negative for hearing loss and voice change.   Eyes: Negative for eye problems and icterus.  Respiratory: Negative for chest tightness, cough and shortness of breath.   Cardiovascular: Negative for chest pain and leg swelling.  Gastrointestinal: Negative for abdominal distention and abdominal pain.  Endocrine: Negative for hot flashes.  Genitourinary: Negative for difficulty urinating, dysuria and frequency.   Musculoskeletal: Negative for arthralgias.  Skin: Negative for itching and rash.       Status post left groin mass resection. Skin hypo-pigmentation on upper extremities.  Neurological: Negative for light-headedness and numbness.  Hematological: Negative for adenopathy. Does not bruise/bleed easily.  Psychiatric/Behavioral: Negative for confusion.    MEDICAL HISTORY:  Past Medical History:  Diagnosis Date  . Anxiety   . Arthritis   . Complication of anesthesia   . Family history of adverse reaction to anesthesia    PONV mother  . Hyperlipidemia   . Hypertension   . Melanoma ([place]) 2012  . PONV (postoperative nausea and vomiting) 04/16/2019  . Presence of permanent cardiac pacemaker    Medtronic  . Seizures ([place])     SURGICAL HISTORY: Past Surgical History:  Procedure Laterality Date  . KNEE SURGERY Left   . LEFT HEART CATH AND CORONARY ANGIOGRAPHY Left 06/29/2017   Procedure: LEFT HEART CATH AND CORONARY  ANGIOGRAPHY;  Surgeon: [name], MD;  Location: [facility];  Service: Cardiovascular;  Laterality: Left;  . LYMPH NODE DISSECTION Left 04/16/2019   Procedure: Left inguinal Lymph Node Dissection;  Surgeon: [name],  MD;  Location: [facility];  Service: General;  Laterality: Left;  Marland Kitchen MELANOMA EXCISION Left 04/16/2019   Procedure: MELANOMA EXCISION LEFT GROIN MASS;  Surgeon: [name], MD;  Location: [facility];  Service: General;  Laterality: Left;  Marland Kitchen MELANOMA EXCISION WITH SENTINEL LYMPH NODE BIOPSY Left 2012   Left calf   . PACEMAKER INSERTION N/A 08/26/2018   Procedure: INSERTION PACEMAKER;  Surgeon: [name], MD;  Location: [facility];  Service: Cardiovascular;  Laterality: N/A;  . TEMPORARY PACEMAKER N/A 08/25/2018   Procedure: TEMPORARY PACEMAKER;  Surgeon: [name], MD;  Location: [facility];  Service: Cardiovascular;  Laterality: N/A;    SOCIAL HISTORY: Social History   Socioeconomic History  . Marital status: Married    Spouse name: Vicente Males   . Number of children: 7  . Years of education: 26  . Highest education level: Not on file  Occupational History  . Occupation: [employer]   Tobacco Use  . Smoking status: Never Smoker  . Smokeless tobacco: Never Used  Substance and Sexual Activity  . Alcohol use: No  . Drug use: No  . Sexual activity: Not on file  Other Topics Concern  . Not on file  Social History Narrative   Lives with mother, wife and sister   Caffeine use: sodas (2 per day)   Social Determinants of Health   Financial Resource Strain: Low Risk   . Difficulty of Paying Living Expenses: Not hard at all  Food Insecurity: No Food Insecurity  . Worried About Charity fundraiser in the Last Year: Never true  . Ran Out of Food in the Last Year: Never true  Transportation Needs: Unmet Transportation Needs  . Lack of Transportation (Medical): Yes  . Lack of Transportation (Non-Medical): Yes  Physical Activity: Unknown  . Days of Exercise per Week: 0 days  . Minutes of Exercise per Session: Not on file  Stress: No Stress Concern Present  . Feeling of Stress : Only a little  Social Connections: Unknown  . Frequency of Communication with Friends and Family: More  than three times a week  . Frequency of Social Gatherings with Friends and Family: Not on file  . Attends Religious Services: Not on file  . Active Member of Clubs or Organizations: Not on file  . Attends Archivist Meetings: Not on file  . Marital Status: Married  Human resources officer Violence: Not At Risk  . Fear of Current or Ex-Partner: No  . Emotionally Abused: No  . Physically Abused: No  . Sexually Abused: No    FAMILY HISTORY: Family History  Problem Relation Age of Onset  . Cancer Paternal Grandmother     ALLERGIES:  is allergic to ibuprofen and nsaids.  MEDICATIONS:  Current Outpatient Medications  Medication Sig Dispense Refill  . acetaminophen (TYLENOL) 650 MG CR tablet Take 650 mg by mouth See admin instructions. Take 1 tablet (650 mg) by mouth scheduled for arthritis pain, may take an additional dose if needed for pain.    Marland Kitchen allopurinol (ZYLOPRIM) 300 MG tablet Take 300 mg by mouth daily.    Marland Kitchen aspirin 81 MG chewable tablet Chew 81 mg by mouth daily.    Marland Kitchen atorvastatin (LIPITOR) 20 MG tablet Take 20 mg by mouth daily.    Marland Kitchen  clonazePAM (KLONOPIN) 0.5 MG tablet 1 tablet in the morning, 2 in the evening 90 tablet 3  . hydrocortisone 2.5 % cream Apply 1 application topically daily as needed (for itching).     Marland Kitchen ketoconazole (NIZORAL) 2 % cream Apply 1 application topically daily as needed for irritation.     . lidocaine (LIDODERM) 5 % Place 1 patch onto the skin every 12 (twelve) hours. Remove & Discard patch within 12 hours or as directed by MD 10 patch 0  . lisinopril (ZESTRIL) 20 MG tablet Take 20 mg by mouth daily.    . metoprolol succinate (TOPROL-XL) 25 MG 24 hr tablet Take 25 mg by mouth daily.     . Multiple Vitamin (MULTIVITAMIN WITH MINERALS) TABS tablet Take 1 tablet by mouth daily. Centrum Silver    . ondansetron (ZOFRAN) 8 MG tablet Take 1 tablet (8 mg total) by mouth every 8 (eight) hours as needed for nausea, vomiting or refractory nausea / vomiting. 30  tablet 0  . silver sulfADIAZINE (SILVADENE) 1 % cream Apply 1 application topically 2 (two) times daily. (Patient not taking: Reported on 08/06/2019) 50 g 2  . traMADol (ULTRAM) 50 MG tablet Take 1 tablet (50 mg total) by mouth every 6 (six) hours as needed. (Patient not taking: Reported on 08/19/2019) 30 tablet 0   No current facility-administered medications for this visit.     PHYSICAL EXAMINATION: ECOG PERFORMANCE STATUS: 0 - Asymptomatic Vitals:   08/20/19 0852  BP: 129/73  Pulse: 79  Resp: 18  Temp: 99.1 F (37.3 C)   Filed Weights   08/20/19 0852  Weight: 227 lb (103 kg)    Physical Exam Constitutional:      General: He is not in acute distress. HENT:     Head: Normocephalic and atraumatic.  Eyes:     General: No scleral icterus.    Pupils: Pupils are equal, round, and reactive to light.  Cardiovascular:     Rate and Rhythm: Normal rate and regular rhythm.     Heart sounds: Normal heart sounds.  Pulmonary:     Effort: Pulmonary effort is normal. No respiratory distress.     Breath sounds: No wheezing.  Abdominal:     General: Bowel sounds are normal. There is no distension.     Palpations: Abdomen is soft. There is no mass.     Tenderness: There is no abdominal tenderness.     Comments:  left groin mass resection and status post radiation.   Musculoskeletal:        General: No deformity. Normal range of motion.     Cervical back: Normal range of motion and neck supple.  Skin:    General: Skin is warm and dry.     Findings: No erythema or rash.  Neurological:     Mental Status: He is alert and oriented to person, place, and time. Mental status is at baseline.     Cranial Nerves: No cranial nerve deficit.     Coordination: Coordination normal.  Psychiatric:        Mood and Affect: Mood normal.    LABORATORY DATA:  I have reviewed the data as listed Lab Results  Component Value Date   WBC 4.2 08/20/2019   HGB 12.1 (L) 08/20/2019   HCT 35.8 (L)  08/20/2019   MCV 87.5 08/20/2019   PLT 199 08/20/2019   Recent Labs    07/14/19 0947 07/23/19 0837 08/06/19 0840  NA 139 141 140  K 4.3 4.1 4.5  CL 104 106 106  CO2 '25 26 27  '$ GLUCOSE 98 119* 107*  BUN 20 22* 19  CREATININE 1.07 1.04 1.15  CALCIUM 9.2 9.1 9.3  GFRNONAA >60 >60 >60  GFRAA >60 >60 >60  PROT 7.4 7.2 7.4  ALBUMIN 4.2 4.3 4.4  AST '24 26 26  '$ ALT 31 37 35  ALKPHOS 95 88 91  BILITOT 0.5 0.6 0.6   Iron/TIBC/Ferritin/ %Sat No results found for: IRON, TIBC, FERRITIN, IRONPCTSAT    RADIOGRAPHIC STUDIES: I have personally reviewed the radiological images as listed and agreed with the findings in the report. US Venous Img Lower Bilateral (DVT)  Result Date: 06/09/2019 CLINICAL DATA:  Leg swelling for 1 month, history of left inguinal node dissection EXAM: Bilateral LOWER EXTREMITY VENOUS DOPPLER ULTRASOUND TECHNIQUE: Gray-scale sonography with compression, as well as color and duplex ultrasound, were performed to evaluate the deep venous system(s) from the level of the common femoral vein through the popliteal and proximal calf veins. COMPARISON:  01/31/2019 FINDINGS: VENOUS Normal compressibility of the common femoral, superficial femoral, and popliteal veins, as well as the visualized calf veins. Visualized portions of profunda femoral vein and great saphenous vein unremarkable. No filling defects to suggest DVT on grayscale or color Doppler imaging. Doppler waveforms show normal direction of venous flow, normal respiratory phasicity and response to augmentation. OTHER Heterogenous elongated hypoechoic area with internal fluid at the left groin, poorly measurable. Complex cyst and solid mass measuring 3.2 x 1 point 9 x 2.7 cm. Limitations: none IMPRESSION: No femoropopliteal DVT nor evidence of DVT within the visualized calf veins. If clinical symptoms are inconsistent or if there are persistent or worsening symptoms, further imaging (possibly involving the iliac veins) may be  warranted. Heterogenous hypoechoic area with internal fluid echogenicity at the left groin, potentially related to history of inguinal no dissection and representing postsurgical change. Complex cystic and solid mass measuring 3.2 cm, appears separate from the heterogenous hypoechoic tissue and may represent enlarged node with necrosis or infection. Electronically Signed   By: Donavan Foil M.D.   On: 06/09/2019 17:07       ASSESSMENT & PLAN:  1. Malignant melanoma of overlapping sites ([place])   2. Encounter for antineoplastic immunotherapy    #Left inguinal nodal recurrence of melanoma.  Status post resection and adjuvant radiation. Labs reviewed and discussed with patient. Proceed with adjuvant nivolumab today. Plan CT scans after 4 cycles of immunotherapy  #Vitiligo, symptoms are stable. #Normocytic anemia, hemoglobins are stable.  Continue to monitor. Iron panel shows adequate iron. Normal folate level B12 level is pending at the time of dictation.  #Patient prefers to have Mediport placed.  He prefers to go to a Garment/textile technologist for procedure.  I will refer patient to vascular surgeon to have Mediport placed.  Pros and cons of Mediport discussed with patient. All questions were answered. The patient knows to call the clinic with any problems questions or concerns.  Follow-up with 2 weeks. We spent sufficient time to discuss many aspect of care, questions were answered to patient's satisfaction.   [name], MD, PhD Hematology Oncology [facility] Pager- [phone] 08/20/2019

## 2019-08-21 ENCOUNTER — Encounter: Payer: BC Managed Care – PPO | Admitting: Physical Therapy

## 2019-08-21 ENCOUNTER — Ambulatory Visit: Payer: BC Managed Care – PPO | Admitting: Occupational Therapy

## 2019-08-21 DIAGNOSIS — I890 Lymphedema, not elsewhere classified: Secondary | ICD-10-CM

## 2019-08-21 NOTE — Therapy (Signed)
[facility] [address] [place], [place], [address] Phone: [phone]   Fax:  [phone]  Occupational Therapy Treatment  Patient Details  Name: Edward Pitts MRN: [id number] Date of Birth: 03-24-1961 Referring Provider (OT): [name], MD   Encounter Date: 08/21/2019  OT End of Session - 08/21/19 1409    Visit Number  12    Number of Visits  36    Date for OT Re-Evaluation  10/22/19    OT Start Time  7322    OT Stop Time  1215    OT Time Calculation (min)  70 min    Activity Tolerance  Patient tolerated treatment well;No increased pain       Past Medical History:  Diagnosis Date  . Anxiety   . Arthritis   . Complication of anesthesia   . Family history of adverse reaction to anesthesia    PONV mother  . Hyperlipidemia   . Hypertension   . Melanoma ([place]) 2012  . PONV (postoperative nausea and vomiting) 04/16/2019  . Presence of permanent cardiac pacemaker    Medtronic  . Seizures ([place])     Past Surgical History:  Procedure Laterality Date  . KNEE SURGERY Left   . LEFT HEART CATH AND CORONARY ANGIOGRAPHY Left 06/29/2017   Procedure: LEFT HEART CATH AND CORONARY ANGIOGRAPHY;  Surgeon: [name], MD;  Location: [place] [facility];  Service: Cardiovascular;  Laterality: Left;  . LYMPH NODE DISSECTION Left 04/16/2019   Procedure: Left inguinal Lymph Node Dissection;  Surgeon: [name], MD;  Location: [place];  Service: General;  Laterality: Left;  Marland Kitchen MELANOMA EXCISION Left 04/16/2019   Procedure: MELANOMA EXCISION LEFT GROIN MASS;  Surgeon: [name], MD;  Location: [place] [facility];  Service: General;  Laterality: Left;  Marland Kitchen MELANOMA EXCISION WITH SENTINEL LYMPH NODE BIOPSY Left 2012   Left calf   . PACEMAKER INSERTION N/A 08/26/2018   Procedure: INSERTION PACEMAKER;  Surgeon: [name], MD;  Location: [facility];  Service: Cardiovascular;  Laterality: N/A;  . TEMPORARY PACEMAKER N/A 08/25/2018    Procedure: TEMPORARY PACEMAKER;  Surgeon: [name], MD;  Location: [place];  Service: Cardiovascular;  Laterality: N/A;    There were no vitals filed for this visit.  Subjective Assessment - 08/21/19 1407    Subjective   Edward Pitts presents to OT for visit 11/36 to address LLE lymphedema 2/2 melanoma Rx. Pt presents without compression wraps in place. Pt reports he took them off this morning to shower in prep for this appointment and did not reapply..    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 with surgical excision w/ LN disection (-7/7 LN); Completed adjuvant XRT 07/07/2019; 08/2018 Pacemaker placed; L knee sx date?;    Limitations  chronic LLE knee pain; post surgical LLE pain,  chronic LLE/LLQ swelling, decreased LLE strength, impaired gait, decreased dynamic balance, altered sensation LLE, increased risk of infection, decreased standing and walking tolerance > 1 hr; decreased ankle AROM    Repetition  Increases Symptoms    Special Tests  +Stemmer base L toes    Patient Stated Goals  reduce swelling to normal and keep it from getting worse    Pain Onset  Other (comment)   post surgical pain onset 1/21; L knee pain chronic  OT Education - 08/21/19 1408    Education Details  Cont to review importance of compliance with compression wraps between sessions.    Person(s) Educated  Patient;Spouse    Methods  Explanation;Demonstration;Handout;Tactile cues;Verbal cues    Comprehension  Verbalized understanding;Returned demonstration;Need further instruction;Verbal cues required;Tactile cues required          OT Long Term Goals - 08/14/19 1627      OT LONG TERM GOAL #1   Title  Pt will be able to apply thigh length, multi-layer, short stretch compression wraps daily to single limb using correct gradient techniques with modified independence (extra time) to achieve optimal limb volume  reduction, to return affected limb/s, as closely as possible, to premorbid size and shape, to limit infection risk, and to improve safe functional ambulation and mobility.   Pt able to apply gradient wraps with max CG assistance   Baseline  Max A    Time  4    Period  Days    Status  Achieved      OT LONG TERM GOAL #2   Title  Pt will be able to verbalize signs and symptoms of cellulitis infection and identify at least 4 common lymphedema precautions using printed resource for reference to limit LE progression over time to limit risk of infection and LE exacerbation.    Baseline  Max A    Time  4    Period  Days    Status  Partially Met      OT LONG TERM GOAL #3   Title  Pt will tolerate thigh length, multilayer short stretch compression wrap to single leg up to 23/ 7 to achieve optimal swelling reduction to arrest lymphedema progression and in preparation for fitting appropriate compression garments/ devices.    Baseline  Max A    Time  12    Period  Weeks    Status  Partially Met   Pt achieved ~50% compliance with compression wraps thus far.     OT LONG TERM GOAL #4   Title  Pt will achieve 10% or greater limb volume reduction to LLE from toes to groin to enable functional improvements including fitting into preferred footwear and clothing, decreased infection risk and improved body image.    Baseline  Max A    Time  12    Period  Weeks    Status  Partially Met   11% LLE limb volume reduction below the knee achieved 08/14/19. Overall limb volume reduction to date = 3%     OT LONG TERM GOAL #5   Title  Pt will achieve and sustain at least 85%  compliance with all daily LE self-care home program components, to include skin care, lymphatic pumping therex, compression wraps and garments/devices, and simple self-MLD, to reduce and control limb swelling, reduce infection risk and limit LE progression.    Baseline  Max A    Time  12    Period  Weeks    Status  Partially Met      OT  LONG TERM GOAL #6   Title  Pt will achieve modified independence ( extra time, assistive device) with donning and doffing day and night time compression garments and devices to limit re-accumulation of lymphatic congestion and progression of lymphedema.    Baseline  Max A    Time  12    Period  Weeks    Status  On-going            Plan -  08/21/19 1409    Clinical Impression Statement  MLD to LLE and LLQ directing flow towards deep abdominal pathways and ipsilateral inguinal - axillary anastamosis. Applied fibrosis techniques and myofasial release to decrease protien ritch ddensity limiting skin fllexibility and contributing to progressive induration. Added custom fabricated comprex foal malleoli pads under Rosidal foam in an effort to reduce fatty fibrosis . Cont as per POC.    OT Occupational Profile and History  Comprehensive Assessment- Review of records and extensive additional review of physical, cognitive, psychosocial history related to current functional performance    Occupational performance deficits (Please refer to evaluation for details):  ADL's;IADL's;Rest and Sleep;Work;Leisure;Social Participation;Other   body image, role performance   Body Structure / Function / Physical Skills  ADL;ROM;Scar mobility;IADL;Edema;Balance;Sensation;Skin integrity;Mobility;Flexibility;Strength;Decreased knowledge of precautions;Gait;Pain;Decreased knowledge of use of DME    Rehab Potential  Good    Clinical Decision Making  Several treatment options, min-mod task modification necessary    Comorbidities Affecting Occupational Performance:  Presence of comorbidities impacting occupational performance    Modification or Assistance to Complete Evaluation   Min-Moderate modification of tasks or assist with assess necessary to complete eval    OT Frequency  2x / week    OT Duration  12 weeks   and PRN   OT Treatment/Interventions  Self-care/ADL training;Therapeutic exercise;Functional Mobility  Training;Manual Therapy;Coping strategies training;Therapeutic activities;Manual lymph drainage;Energy conservation;DME and/or AE instruction;Compression bandaging;Other (comment);Scar mobilization;Patient/family education   fit with appropriate compression garments once swelling is reduced   Plan  Complete Decongestive Therapy (CDT), Intensive and Management Phases to include  manual lymphatyic drainage (MLD), skin care, therapeutic exercise, compression with short stretch wraps and garments, Pt edu for LE sef care    Consulted and Agree with Plan of Care  Patient       Patient will benefit from skilled therapeutic intervention in order to improve the following deficits and impairments:   Body Structure / Function / Physical Skills: ADL, ROM, Scar mobility, IADL, Edema, Balance, Sensation, Skin integrity, Mobility, Flexibility, Strength, Decreased knowledge of precautions, Gait, Pain, Decreased knowledge of use of DME       Visit Diagnosis: Lymphedema, not elsewhere classified    Problem List Patient Active Problem List   Diagnosis Date Noted  . Port-A-Cath in place 08/20/2019  . Encounter for antineoplastic immunotherapy 08/06/2019  . Malignant melanoma of overlapping sites ([place]) 04/23/2019  . Goals of care, counseling/discussion 04/23/2019  . 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 Near syncope 12/17/2018  . Anemia in chronic kidney disease 12/11/2018  . Benign hypertensive kidney disease with chronic kidney disease 12/11/2018  . Stage 3a chronic kidney disease 12/11/2018  . AKI (acute kidney injury) ([place]) 08/24/2018  . Acute hyperkalemia 08/24/2018  . HTN (hypertension) 08/24/2018  . HLD (hyperlipidemia) 08/24/2018  . Seizures ([place]) 08/24/2018  . Complete heart block ([place]) 08/24/2018  . Arthritis of wrist, left 06/12/2018  . Bradycardia 06/04/2018  . 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 Positive ANA (antinuclear antibody) 03/05/2018   . Swelling of joint of left wrist 03/05/2018  . Coronary artery disease involving native coronary artery of native heart 07/09/2017  . Palpitations 07/09/2017  . Stable angina ([place]) 06/27/2017  . Benign essential HTN 05/25/2017  . LBBB (left bundle branch block) 05/25/2017    Andrey Spearman, MS, OTR/L, [facility] 08/21/19 2:12 PM  [place] [facility] SERVICES [address] [place], [place], [address] Phone: [phone]   Fax:  [phone]  Name:  Edward Pitts MRN: [id number] Date of Birth: 1960-11-19

## 2019-08-22 ENCOUNTER — Other Ambulatory Visit: Payer: Self-pay

## 2019-08-22 ENCOUNTER — Other Ambulatory Visit
Admission: RE | Admit: 2019-08-22 | Discharge: 2019-08-22 | Disposition: A | Discharge status: Home / Self Care | Payer: BC Managed Care – PPO | Source: Ambulatory Visit | Attending: Vascular Surgery | Admitting: Vascular Surgery

## 2019-08-22 DIAGNOSIS — Z20822 Contact with and (suspected) exposure to covid-19: Secondary | ICD-10-CM | POA: Insufficient documentation

## 2019-08-22 DIAGNOSIS — Z01812 Encounter for preprocedural laboratory examination: Secondary | ICD-10-CM | POA: Insufficient documentation

## 2019-08-23 LAB — SARS CORONAVIRUS 2 (TAT 6-24 HRS): SARS Coronavirus 2: NEGATIVE

## 2019-08-25 ENCOUNTER — Other Ambulatory Visit (INDEPENDENT_AMBULATORY_CARE_PROVIDER_SITE_OTHER): Payer: Self-pay | Admitting: Nurse Practitioner

## 2019-08-26 ENCOUNTER — Other Ambulatory Visit: Payer: Self-pay

## 2019-08-26 ENCOUNTER — Ambulatory Visit: Payer: BC Managed Care – PPO | Admitting: Physical Therapy

## 2019-08-26 ENCOUNTER — Ambulatory Visit: Payer: BC Managed Care – PPO | Admitting: Occupational Therapy

## 2019-08-26 ENCOUNTER — Encounter
Admission: RE | Disposition: A | Discharge status: Home / Self Care | Payer: Self-pay | Source: Ambulatory Visit | Attending: Vascular Surgery

## 2019-08-26 ENCOUNTER — Encounter: Payer: Self-pay | Admitting: Vascular Surgery

## 2019-08-26 ENCOUNTER — Ambulatory Visit
Admission: RE | Admit: 2019-08-26 | Discharge: 2019-08-26 | Disposition: A | Discharge status: Home / Self Care | Source: Ambulatory Visit | Attending: Vascular Surgery | Admitting: Vascular Surgery

## 2019-08-26 DIAGNOSIS — L80 Vitiligo: Secondary | ICD-10-CM | POA: Insufficient documentation

## 2019-08-26 DIAGNOSIS — F419 Anxiety disorder, unspecified: Secondary | ICD-10-CM | POA: Diagnosis not present

## 2019-08-26 DIAGNOSIS — Z950 Presence of cardiac pacemaker: Secondary | ICD-10-CM | POA: Insufficient documentation

## 2019-08-26 DIAGNOSIS — Z95828 Presence of other vascular implants and grafts: Secondary | ICD-10-CM

## 2019-08-26 DIAGNOSIS — C438 Malignant melanoma of overlapping sites of skin: Secondary | ICD-10-CM

## 2019-08-26 DIAGNOSIS — E119 Type 2 diabetes mellitus without complications: Secondary | ICD-10-CM | POA: Diagnosis not present

## 2019-08-26 DIAGNOSIS — Z79899 Other long term (current) drug therapy: Secondary | ICD-10-CM | POA: Insufficient documentation

## 2019-08-26 DIAGNOSIS — C439 Malignant melanoma of skin, unspecified: Secondary | ICD-10-CM | POA: Diagnosis not present

## 2019-08-26 DIAGNOSIS — Z7982 Long term (current) use of aspirin: Secondary | ICD-10-CM | POA: Insufficient documentation

## 2019-08-26 DIAGNOSIS — I10 Essential (primary) hypertension: Secondary | ICD-10-CM | POA: Insufficient documentation

## 2019-08-26 DIAGNOSIS — D649 Anemia, unspecified: Secondary | ICD-10-CM | POA: Diagnosis not present

## 2019-08-26 DIAGNOSIS — R569 Unspecified convulsions: Secondary | ICD-10-CM | POA: Insufficient documentation

## 2019-08-26 DIAGNOSIS — E785 Hyperlipidemia, unspecified: Secondary | ICD-10-CM | POA: Diagnosis not present

## 2019-08-26 HISTORY — PX: PORTA CATH INSERTION: CATH118285

## 2019-08-26 SURGERY — PORTA CATH INSERTION
Anesthesia: Moderate Sedation

## 2019-08-26 MED ORDER — FENTANYL CITRATE (PF) 100 MCG/2ML IJ SOLN
INTRAMUSCULAR | Status: AC
  Filled 2019-08-26: qty 2

## 2019-08-26 MED ORDER — MIDAZOLAM HCL 2 MG/2ML IJ SOLN
INTRAMUSCULAR | Status: DC | PRN
  Administered 2019-08-26: 2 mg via INTRAVENOUS
  Administered 2019-08-26: 0.5 mg via INTRAVENOUS
  Administered 2019-08-26: 1 mg via INTRAVENOUS

## 2019-08-26 MED ORDER — ONDANSETRON HCL 4 MG/2ML IJ SOLN: 4.0000 mg | Freq: Four times a day (QID) | INTRAMUSCULAR | Status: DC | PRN

## 2019-08-26 MED ORDER — FAMOTIDINE 20 MG PO TABS: 40.0000 mg | ORAL_TABLET | Freq: Once | ORAL | Status: DC | PRN

## 2019-08-26 MED ORDER — SODIUM CHLORIDE 0.9 % IV SOLN: INTRAVENOUS | Status: DC

## 2019-08-26 MED ORDER — METHYLPREDNISOLONE SODIUM SUCC 125 MG IJ SOLR: 125.0000 mg | Freq: Once | INTRAMUSCULAR | Status: DC | PRN

## 2019-08-26 MED ORDER — CEFAZOLIN SODIUM-DEXTROSE 2-4 GM/100ML-% IV SOLN: 2.0000 g | Freq: Once | INTRAVENOUS | Status: AC

## 2019-08-26 MED ORDER — HYDROMORPHONE HCL 1 MG/ML IJ SOLN: 1.0000 mg | Freq: Once | INTRAMUSCULAR | Status: DC | PRN

## 2019-08-26 MED ORDER — DIPHENHYDRAMINE HCL 50 MG/ML IJ SOLN: 50.0000 mg | Freq: Once | INTRAMUSCULAR | Status: DC | PRN

## 2019-08-26 MED ORDER — MIDAZOLAM HCL 2 MG/ML PO SYRP: 8.0000 mg | ORAL_SOLUTION | Freq: Once | ORAL | Status: DC | PRN

## 2019-08-26 MED ORDER — LIDOCAINE-PRILOCAINE 2.5-2.5 % EX CREA: 1.0000 "application " | TOPICAL_CREAM | CUTANEOUS | 2 refills | Status: DC | PRN

## 2019-08-26 MED ORDER — MIDAZOLAM HCL 5 MG/5ML IJ SOLN
INTRAMUSCULAR | Status: AC
  Filled 2019-08-26: qty 5

## 2019-08-26 MED ORDER — CEFAZOLIN SODIUM-DEXTROSE 2-4 GM/100ML-% IV SOLN
INTRAVENOUS | Status: AC
  Administered 2019-08-26: 2 g via INTRAVENOUS
  Filled 2019-08-26: qty 100

## 2019-08-26 MED ORDER — FENTANYL CITRATE (PF) 100 MCG/2ML IJ SOLN
INTRAMUSCULAR | Status: DC | PRN
  Administered 2019-08-26: 25 ug via INTRAVENOUS
  Administered 2019-08-26: 50 ug via INTRAVENOUS
  Administered 2019-08-26: 25 ug via INTRAVENOUS

## 2019-08-26 SURGICAL SUPPLY — 10 items
DRAPE INCISE IOBAN 66X45 STRL (DRAPES) ×2 IMPLANT
KIT PORT POWER 8FR ISP CVUE (Port) ×1 IMPLANT
NDL ENTRY 21GA 7CM ECHOTIP (NEEDLE) IMPLANT
NEEDLE ENTRY 21GA 7CM ECHOTIP (NEEDLE) ×2 IMPLANT
PACK ANGIOGRAPHY (CUSTOM PROCEDURE TRAY) ×1 IMPLANT
SET INTRO CAPELLA COAXIAL (SET/KITS/TRAYS/PACK) ×1 IMPLANT
SUT MNCRL AB 4-0 PS2 18 (SUTURE) ×2 IMPLANT
SUT PROLENE 0 CT 1 30 (SUTURE) ×2 IMPLANT
SUT VIC AB 3-0 SH 27 (SUTURE) ×1
SUT VIC AB 3-0 SH 27X BRD (SUTURE) IMPLANT

## 2019-08-26 NOTE — Discharge Instructions (Signed)
Implanted Port Insertion, Care After °This sheet gives you information about how to care for yourself after your procedure. Your health care provider may also give you more specific instructions. If you have problems or questions, contact your health care provider. °What can I expect after the procedure? °After the procedure, it is common to have: °· Discomfort at the port insertion site. °· Bruising on the skin over the port. This should improve over 3-4 days. °Follow these instructions at home: °Port care °· After your port is placed, you will get a manufacturer's information card. The card has information about your port. Keep this card with you at all times. °· Take care of the port as told by your health care provider. Ask your health care provider if you or a family member can get training for taking care of the port at home. A home health care nurse may also take care of the port. °· Make sure to remember what type of port you have. °Incision care ° °  ° °· Follow instructions from your health care provider about how to take care of your port insertion site. Make sure you: °? Wash your hands with soap and water before and after you change your bandage (dressing). If soap and water are not available, use hand sanitizer. °? Change your dressing as told by your health care provider. °? Leave stitches (sutures), skin glue, or adhesive strips in place. These skin closures may need to stay in place for 2 weeks or longer. If adhesive strip edges start to loosen and curl up, you may trim the loose edges. Do not remove adhesive strips completely unless your health care provider tells you to do that. °· Check your port insertion site every day for signs of infection. Check for: °? Redness, swelling, or pain. °? Fluid or blood. °? Warmth. °? Pus or a bad smell. °Activity °· Return to your normal activities as told by your health care provider. Ask your health care provider what activities are safe for you. °· Do not  lift anything that is heavier than 10 lb (4.5 kg), or the limit that you are told, until your health care provider says that it is safe. °General instructions °· Take over-the-counter and prescription medicines only as told by your health care provider. °· Do not take baths, swim, or use a hot tub until your health care provider approves. Ask your health care provider if you may take showers. You may only be allowed to take sponge baths. °· Do not drive for 24 hours if you were given a sedative during your procedure. °· Wear a medical alert bracelet in case of an emergency. This will tell any health care providers that you have a port. °· Keep all follow-up visits as told by your health care provider. This is important. °Contact a health care provider if: °· You cannot flush your port with saline as directed, or you cannot draw blood from the port. °· You have a fever or chills. °· You have redness, swelling, or pain around your port insertion site. °· You have fluid or blood coming from your port insertion site. °· Your port insertion site feels warm to the touch. °· You have pus or a bad smell coming from the port insertion site. °Get help right away if: °· You have chest pain or shortness of breath. °· You have bleeding from your port that you cannot control. °Summary °· Take care of the port as told by your health   care provider. Keep the manufacturer's information card with you at all times. °· Change your dressing as told by your health care provider. °· Contact a health care provider if you have a fever or chills or if you have redness, swelling, or pain around your port insertion site. °· Keep all follow-up visits as told by your health care provider. °This information is not intended to replace advice given to you by your health care provider. Make sure you discuss any questions you have with your health care provider. °Document Revised: 10/09/2017 Document Reviewed: 10/09/2017 °Elsevier Patient Education ©  2020 Elsevier Inc. °Moderate Conscious Sedation, Adult, Care After °These instructions provide you with information about caring for yourself after your procedure. Your health care provider may also give you more specific instructions. Your treatment has been planned according to current medical practices, but problems sometimes occur. Call your health care provider if you have any problems or questions after your procedure. °What can I expect after the procedure? °After your procedure, it is common: °· To feel sleepy for several hours. °· To feel clumsy and have poor balance for several hours. °· To have poor judgment for several hours. °· To vomit if you eat too soon. °Follow these instructions at home: °For at least 24 hours after the procedure: ° °· Do not: °? Participate in activities where you could fall or become injured. °? Drive. °? Use heavy machinery. °? Drink alcohol. °? Take sleeping pills or medicines that cause drowsiness. °? Make important decisions or sign legal documents. °? Take care of children on your own. °· Rest. °Eating and drinking °· Follow the diet recommended by your health care provider. °· If you vomit: °? Drink water, juice, or soup when you can drink without vomiting. °? Make sure you have little or no nausea before eating solid foods. °General instructions °· Have a responsible adult stay with you until you are awake and alert. °· Take over-the-counter and prescription medicines only as told by your health care provider. °· If you smoke, do not smoke without supervision. °· Keep all follow-up visits as told by your health care provider. This is important. °Contact a health care provider if: °· You keep feeling nauseous or you keep vomiting. °· You feel light-headed. °· You develop a rash. °· You have a fever. °Get help right away if: °· You have trouble breathing. °This information is not intended to replace advice given to you by your health care provider. Make sure you discuss any  questions you have with your health care provider. °Document Revised: 02/23/2017 Document Reviewed: 07/03/2015 °Elsevier Patient Education © 2020 Elsevier Inc. ° °

## 2019-08-26 NOTE — H&P (Signed)
[facility] History & Physical Update  The patient was interviewed and re-examined.  The patient's previous History and Physical has been reviewed and is unchanged.  There is no change in the plan of care. We plan to proceed with the scheduled procedure.  Hortencia Pilar, MD  08/26/2019, 10:17 AM

## 2019-08-26 NOTE — Op Note (Signed)
OPERATIVE NOTE   PROCEDURE: 1. Placement of a right IJ Infuse-a-Port  PRE-OPERATIVE DIAGNOSIS: Malignant melanoma  POST-OPERATIVE DIAGNOSIS: Same  SURGEON: Katha Cabal M.D.  ANESTHESIA: Conscious sedation was administered under my direct supervision by the interventional radiology RN. IV Versed plus fentanyl were utilized. Continuous ECG, pulse oximetry and blood pressure was monitored throughout the entire procedure. Conscious sedation was for a total of 35 minutes.  ESTIMATED BLOOD LOSS: Minimal   FINDING(S): 1.  Patent vein  SPECIMEN(S): None  INDICATIONS:   Edward Pitts is a 59 y.o. male who presents with malignant melanoma.  He will require chemotherapy and therefore appropriate parenteral access.  Risk and benefits for Infuse-a-Port placement of been reviewed patient wishes to proceed..  DESCRIPTION: After obtaining full informed written consent, the patient was brought back to the special procedure suite and placed in the supine position. The patient's right neck and chest wall are prepped and draped in sterile fashion. Appropriate timeout was called.  Ultrasound is placed in a sterile sleeve, ultrasound is utilized to avoid vascular injury as well as secondary to lack of appropriate landmarks. The right internal jugular vein is identified. It is echolucent and homogeneous as well as easily compressible indicating patency. An image is recorded for the permanent record.  Access to the vein with a micropuncture needle is done under direct ultrasound visualization.  1% lidocaine is infiltrated into the soft tissue at the base of the neck as well as on the chest wall.  Under direct ultrasound visualization a micro-needle is inserted into the vein followed by the micro-wire. Micro-sheath was then advanced and a J wire is inserted without difficulty under fluoroscopic guidance. A small counterincision was created at the wire insertion site. A transverse incision is created 2  fingerbreadths below the scapula and a pocket is fashioned using both blunt and sharp dissection. The pocket is tested for appropriate size with the hub of the Infuse-a-Port. The tunneling device is then used to pull the intravascular portion of the catheter from the pocket to the neck counterincision.  Dilator and peel-away sheath were then inserted over the wire and the wire is removed. Catheter is then advanced into the venous system without difficulty. Peel-away sheath was then removed.  Catheter is then positioned under fluoroscopic guidance at the atrial caval junction. It is then transected connected to the hub and the hope is slipped into the subcutaneous pocket on the chest wall. The hub was then accessed percutaneously and aspirates easily and flushes well and is flushed with 30 cc of heparinized saline. The pocket incision is then closed in layers using interrupted 3-0 Vicryl for the subcutaneous tissues and 4-0 Monocryl subcuticular for skin closure. Dermabond is applied. The neck counterincision was closed with 4-0 Monocryl subcuticular and Dermabond as well.  The patient tolerated the procedure well and there were no immediate complications.  COMPLICATIONS: None  CONDITION: Unchanged  Katha Cabal M.D. [facility] Office: [phone]   08/26/2019, 12:27 PM

## 2019-08-27 ENCOUNTER — Encounter: Payer: BC Managed Care – PPO | Admitting: Occupational Therapy

## 2019-08-28 ENCOUNTER — Encounter: Payer: BC Managed Care – PPO | Admitting: Physical Therapy

## 2019-08-28 ENCOUNTER — Encounter: Payer: BC Managed Care – PPO | Admitting: Occupational Therapy

## 2019-09-01 ENCOUNTER — Ambulatory Visit: Payer: BC Managed Care – PPO | Admitting: Occupational Therapy

## 2019-09-03 ENCOUNTER — Encounter: Payer: Self-pay | Admitting: Oncology

## 2019-09-03 ENCOUNTER — Inpatient Hospital Stay

## 2019-09-03 ENCOUNTER — Inpatient Hospital Stay (HOSPITAL_BASED_OUTPATIENT_CLINIC_OR_DEPARTMENT_OTHER): Admitting: Oncology

## 2019-09-03 ENCOUNTER — Other Ambulatory Visit: Payer: Self-pay

## 2019-09-03 ENCOUNTER — Ambulatory Visit: Payer: BC Managed Care – PPO | Attending: Oncology | Admitting: Occupational Therapy

## 2019-09-03 ENCOUNTER — Inpatient Hospital Stay: Attending: Oncology

## 2019-09-03 VITALS — BP 140/70 | HR 86 | Temp 97.4°F | Wt 228.0 lb

## 2019-09-03 DIAGNOSIS — C4359 Malignant melanoma of other part of trunk: Secondary | ICD-10-CM | POA: Diagnosis present

## 2019-09-03 DIAGNOSIS — Z809 Family history of malignant neoplasm, unspecified: Secondary | ICD-10-CM | POA: Diagnosis not present

## 2019-09-03 DIAGNOSIS — Z79899 Other long term (current) drug therapy: Secondary | ICD-10-CM | POA: Insufficient documentation

## 2019-09-03 DIAGNOSIS — D649 Anemia, unspecified: Secondary | ICD-10-CM | POA: Insufficient documentation

## 2019-09-03 DIAGNOSIS — Z923 Personal history of irradiation: Secondary | ICD-10-CM | POA: Insufficient documentation

## 2019-09-03 DIAGNOSIS — Z5112 Encounter for antineoplastic immunotherapy: Secondary | ICD-10-CM

## 2019-09-03 DIAGNOSIS — L80 Vitiligo: Secondary | ICD-10-CM | POA: Insufficient documentation

## 2019-09-03 DIAGNOSIS — Z95828 Presence of other vascular implants and grafts: Secondary | ICD-10-CM

## 2019-09-03 DIAGNOSIS — C438 Malignant melanoma of overlapping sites of skin: Secondary | ICD-10-CM

## 2019-09-03 DIAGNOSIS — I890 Lymphedema, not elsewhere classified: Secondary | ICD-10-CM | POA: Insufficient documentation

## 2019-09-03 LAB — COMPREHENSIVE METABOLIC PANEL
ALT: 34 U/L (ref 0–44)
AST: 33 U/L (ref 15–41)
Albumin: 4.4 g/dL (ref 3.5–5.0)
Alkaline Phosphatase: 97 U/L (ref 38–126)
Anion gap: 9 (ref 5–15)
BUN: 28 mg/dL — ABNORMAL HIGH (ref 6–20)
CO2: 25 mmol/L (ref 22–32)
Calcium: 9.1 mg/dL (ref 8.9–10.3)
Chloride: 107 mmol/L (ref 98–111)
Creatinine, Ser: 1.24 mg/dL (ref 0.61–1.24)
GFR calc Af Amer: >60 mL/min (ref >60)
GFR calc non Af Amer: >60 mL/min (ref >60)
Glucose, Bld: 104 mg/dL — ABNORMAL HIGH (ref 70–99)
Potassium: 4 mmol/L (ref 3.5–5.1)
Sodium: 141 mmol/L (ref 135–145)
Total Bilirubin: 0.8 mg/dL (ref 0.3–1.2)
Total Protein: 7.2 g/dL (ref 6.5–8.1)

## 2019-09-03 LAB — CBC WITH DIFFERENTIAL/PLATELET
Abs Immature Granulocytes: 0.04 10*3/uL (ref 0.00–0.07)
Basophils Absolute: 0 10*3/uL (ref 0.0–0.1)
Basophils Relative: 1 %
Eosinophils Absolute: 0.3 10*3/uL (ref 0.0–0.5)
Eosinophils Relative: 7 %
HCT: 35 % — ABNORMAL LOW (ref 39.0–52.0)
Hemoglobin: 11.9 g/dL — ABNORMAL LOW (ref 13.0–17.0)
Immature Granulocytes: 1 %
Lymphocytes Relative: 16 %
Lymphs Abs: 0.7 10*3/uL (ref 0.7–4.0)
MCH: 29.8 pg (ref 26.0–34.0)
MCHC: 34 g/dL (ref 30.0–36.0)
MCV: 87.5 fL (ref 80.0–100.0)
Monocytes Absolute: 0.5 10*3/uL (ref 0.1–1.0)
Monocytes Relative: 10 %
Neutro Abs: 2.9 10*3/uL (ref 1.7–7.7)
Neutrophils Relative %: 65 %
Platelets: 206 10*3/uL (ref 150–400)
RBC: 4 MIL/uL — ABNORMAL LOW (ref 4.22–5.81)
RDW: 13.3 % (ref 11.5–15.5)
WBC: 4.5 10*3/uL (ref 4.0–10.5)
nRBC: 0 % (ref 0.0–0.2)

## 2019-09-03 LAB — TSH: TSH: 2.488 u[IU]/mL (ref 0.350–4.500)

## 2019-09-03 MED ORDER — SODIUM CHLORIDE 0.9 % IV SOLN
Freq: Once | INTRAVENOUS | Status: AC
  Filled 2019-09-03: qty 250

## 2019-09-03 MED ORDER — HEPARIN SOD (PORK) LOCK FLUSH 100 UNIT/ML IV SOLN
500.0000 [IU] | Freq: Once | INTRAVENOUS | Status: AC
  Administered 2019-09-03: 500 [IU] via INTRAVENOUS
  Filled 2019-09-03: qty 5

## 2019-09-03 MED ORDER — SODIUM CHLORIDE 0.9% FLUSH
10.0000 mL | Freq: Once | INTRAVENOUS | Status: AC
  Administered 2019-09-03: 10 mL via INTRAVENOUS
  Filled 2019-09-03: qty 10

## 2019-09-03 MED ORDER — HEPARIN SOD (PORK) LOCK FLUSH 100 UNIT/ML IV SOLN
INTRAVENOUS | Status: AC
  Filled 2019-09-03: qty 5

## 2019-09-03 MED ORDER — SODIUM CHLORIDE 0.9 % IV SOLN
240.0000 mg | Freq: Once | INTRAVENOUS | Status: AC
  Administered 2019-09-03: 240 mg via INTRAVENOUS
  Filled 2019-09-03: qty 24

## 2019-09-03 NOTE — Progress Notes (Signed)
Hematology/Oncology follow up  note [facility] Telephone:([phone]) [phone] Fax:([phone]) [phone]   Patient Care Team: [name], [name], MD as PCP - General (Student)  REFERRING PROVIDER: Cherylann Parr, MD  CHIEF COMPLAINTS/REASON FOR VISIT:  Follow up for melanoma HISTORY OF PRESENTING ILLNESS:   Edward Pitts is a  59 y.o.  male with PMH listed below was seen in consultation at the request of  [name], [name], MD  for evaluation of inguinal mass Patient presented to emergency room 3 days ago complaining about left inguinal mass discomfort. Reports that he has really noticed the mass growing for the past 1 months. He has a history of left lower extremity melanoma in 2011, status post local excision.  Pain was increased with squatting of laxation. He was advised to take Tylenol for pain. Denies any fever, chills, night sweating.  He does feel mild nauseated. Appetite is fair.  He has lost about 10 pounds since earlier this year. 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  Left inguinal and left iliac nodes which are suspicious for involvement.  There are also 2 small nonspecific hypodense lesions within the right liver, nonspecific.  # patient underwent left groin mass resection On 04/16/2019. Resection pathology showed malignant melanoma, replacing a lymph node, with extracapsular extension, peripheral and deep margins involved.  Left inguinal contents, all 7 lymph nodes were negative for melanoma in the lymph nodes.  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  # PDL1 80% TPS  # 04/16/2019. underwent left groin mass resection   07/07/2019  Status post adjuvant radiation and finished radiation   INTERVAL HISTORY Edward Pitts is a 59 y.o. male who has above history reviewed by me today presents for follow up visit for management of  inguinal nodal recurrence of melanoma Problems and complaints are listed below: Patient tolerates immunotherapy well.  He has no new complaints.  He has had a Mediport placed  . Review of Systems  Constitutional: Negative for appetite change, chills, fatigue, fever and unexpected weight change.  HENT:   Negative for hearing loss and voice change.   Eyes: Negative for eye problems and icterus.  Respiratory: Negative for chest tightness, cough and shortness of breath.   Cardiovascular: Negative for chest pain and leg swelling.  Gastrointestinal: Negative for abdominal distention and abdominal pain.  Endocrine: Negative for hot flashes.  Genitourinary: Negative for difficulty urinating, dysuria and frequency.   Musculoskeletal: Negative for arthralgias.  Skin: Negative for itching and rash.       Status post left groin mass resection. Skin hypo-pigmentation on upper extremities.  Neurological: Negative for light-headedness and numbness.  Hematological: Negative for adenopathy. Does not bruise/bleed easily.  Psychiatric/Behavioral: Negative for confusion.    MEDICAL HISTORY:  Past Medical History:  Diagnosis Date  . Anxiety   . Arthritis   . Complication of anesthesia   . Family history of adverse reaction to anesthesia    PONV mother  . Hyperlipidemia   . Hypertension   . Melanoma ([place]) 2012  . PONV (postoperative nausea and vomiting) 04/16/2019  . Presence of permanent cardiac pacemaker    [employer]tr[employer]  . Seizures ([place])     SURGICAL HISTORY: Past Surgical History:  Procedure Laterality Date  . KNEE SURGERY Left   . LEFT HEART CATH AND CORONARY ANGIOGRAPHY Left 06/29/2017   Procedure: LEFT HEART CATH AND CORONARY ANGIOGRAPHY;  Surgeon: [name], MD;  Location: [facility];  Service: Cardiovascular;  Laterality: Left;  . LYMPH NODE DISSECTION Left 04/16/2019   Procedure: Left inguinal Lymph Node Dissection;  Surgeon: [name], MD;  Location: [facility];   Service: General;  Laterality: Left;  Marland Kitchen MELANOMA EXCISION Left 04/16/2019   Procedure: MELANOMA EXCISION LEFT GROIN MASS;  Surgeon: [name], MD;  Location: [facility];  Service: General;  Laterality: Left;  Marland Kitchen MELANOMA EXCISION WITH SENTINEL LYMPH NODE BIOPSY Left 2012   Left calf   . PACEMAKER INSERTION N/A 08/26/2018   Procedure: INSERTION PACEMAKER;  Surgeon: [name], MD;  Location: [facility];  Service: Cardiovascular;  Laterality: N/A;  . PORTA CATH INSERTION N/A 08/26/2019   Procedure: PORTA CATH INSERTION;  Surgeon: Katha Cabal, MD;  Location: [facility];  Service: Cardiovascular;  Laterality: N/A;  . TEMPORARY PACEMAKER N/A 08/25/2018   Procedure: TEMPORARY PACEMAKER;  Surgeon: [name], MD;  Location: [facility];  Service: Cardiovascular;  Laterality: N/A;    SOCIAL HISTORY: Social History   Socioeconomic History  . Marital status: Married    Spouse name: Vicente Males   . Number of children: 7  . Years of education: 73  . Highest education level: Not on file  Occupational History  . Occupation: [employer]   Tobacco Use  . Smoking status: Never Smoker  . Smokeless tobacco: Never Used  Substance and Sexual Activity  . Alcohol use: No  . Drug use: No  . Sexual activity: Not on file  Other Topics Concern  . Not on file  Social History Narrative   Lives with mother, wife and sister   Caffeine use: sodas (2 per day)   Social Determinants of Health   Financial Resource Strain: Low Risk   . Difficulty of Paying Living Expenses: Not hard at all  Food Insecurity: No Food Insecurity  . Worried About Charity fundraiser in the Last Year: Never true  . Ran Out of Food in the Last Year: Never true  Transportation Needs: Unmet Transportation Needs  . Lack of Transportation (Medical): Yes  . Lack of Transportation (Non-Medical): Yes  Physical Activity: Unknown  . Days of Exercise per Week: 0 days  . Minutes of Exercise per Session: Not on file    Stress: No Stress Concern Present  . Feeling of Stress : Only a little  Social Connections: Unknown  . Frequency of Communication with Friends and Family: More than three times a week  . Frequency of Social Gatherings with Friends and Family: Not on file  . Attends Religious Services: Not on file  . Active Member of Clubs or Organizations: Not on file  . Attends Archivist Meetings: Not on file  . Marital Status: Married  Human resources officer Violence: Not At Risk  . Fear of Current or Ex-Partner: No  . Emotionally Abused: No  . Physically Abused: No  . Sexually Abused: No    FAMILY HISTORY: Family History  Problem Relation Age of Onset  . Cancer Paternal Grandmother     ALLERGIES:  is allergic to ibuprofen and nsaids.  MEDICATIONS:  Current Outpatient Medications  Medication Sig Dispense Refill  . acetaminophen (TYLENOL) 650 MG CR tablet Take 650 mg by mouth every 8 (eight) hours as needed for pain.     Marland Kitchen allopurinol (ZYLOPRIM) 300 MG tablet Take 300 mg by mouth daily.    Marland Kitchen aspirin 81 MG chewable tablet Chew 81 mg by mouth  daily.    . atorvastatin (LIPITOR) 20 MG tablet Take 20 mg by mouth daily.    . clonazePAM (KLONOPIN) 0.5 MG tablet 1 tablet in the morning, 2 in the evening (Patient taking differently: Take 0.5-1 mg by mouth See admin instructions. Take 0.5 mg by mouth in the morning and 0.5-1 mg in the evening) 90 tablet 3  . hydrocortisone 2.5 % cream Apply topically daily as needed.    Marland Kitchen ketoconazole (NIZORAL) 2 % cream Apply 1 application topically daily as needed for irritation.    . lidocaine (LIDODERM) 5 % Place 1 patch onto the skin every 12 (twelve) hours. Remove & Discard patch within 12 hours or as directed by MD (Patient not taking: Reported on 08/21/2019) 10 patch 0  . lidocaine-prilocaine (EMLA) cream Apply 1 application topically as needed. Apply small amount of cream to port site approx 1-2 hours prior to appointment. 30 g 2  . lisinopril (ZESTRIL) 20  MG tablet Take 20 mg by mouth daily.    . metoprolol succinate (TOPROL-XL) 25 MG 24 hr tablet Take 25 mg by mouth daily.     . Multiple Vitamin (MULTIVITAMIN WITH MINERALS) TABS tablet Take 1 tablet by mouth daily. Centrum Silver    . ondansetron (ZOFRAN) 8 MG tablet Take 1 tablet (8 mg total) by mouth every 8 (eight) hours as needed for nausea, vomiting or refractory nausea / vomiting. (Patient not taking: Reported on 08/21/2019) 30 tablet 0  . silver sulfADIAZINE (SILVADENE) 1 % cream Apply 1 application topically 2 (two) times daily. (Patient not taking: Reported on 08/06/2019) 50 g 2  . traMADol (ULTRAM) 50 MG tablet Take 1 tablet (50 mg total) by mouth every 6 (six) hours as needed. (Patient not taking: Reported on 08/19/2019) 30 tablet 0   No current facility-administered medications for this visit.   Facility-Administered Medications Ordered in Other Visits  Medication Dose Route Frequency Provider Last Rate Last Admin  . heparin lock flush 100 unit/mL  500 Units Intravenous Once [name], MD      . sodium chloride flush (NS) 0.9 % injection 10 mL  10 mL Intravenous Once [name], MD         PHYSICAL EXAMINATION: ECOG PERFORMANCE STATUS: 0 - Asymptomatic Vitals:   09/03/19 0857  BP: 140/70  Pulse: 86  Temp: (!) 97.4 F (36.3 C)  SpO2: 96%   Filed Weights   09/03/19 0857  Weight: 228 lb (103.4 kg)    Physical Exam Constitutional:      General: He is not in acute distress. HENT:     Head: Normocephalic and atraumatic.  Eyes:     General: No scleral icterus.    Pupils: Pupils are equal, round, and reactive to light.  Cardiovascular:     Rate and Rhythm: Normal rate and regular rhythm.     Heart sounds: Normal heart sounds.  Pulmonary:     Effort: Pulmonary effort is normal. No respiratory distress.     Breath sounds: No wheezing.  Abdominal:     General: Bowel sounds are normal. There is no distension.     Palpations: Abdomen is soft. There is no mass.     Tenderness:  There is no abdominal tenderness.     Comments:  left groin mass resection and status post radiation.   Musculoskeletal:        General: No deformity. Normal range of motion.     Cervical back: Normal range of motion and neck supple.  Skin:  General: Skin is warm and dry.     Findings: No erythema or rash.  Neurological:     Mental Status: He is alert and oriented to person, place, and time. Mental status is at baseline.     Cranial Nerves: No cranial nerve deficit.     Coordination: Coordination normal.  Psychiatric:        Mood and Affect: Mood normal.    LABORATORY DATA:  I have reviewed the data as listed Lab Results  Component Value Date   WBC 4.2 08/20/2019   HGB 12.1 (L) 08/20/2019   HCT 35.8 (L) 08/20/2019   MCV 87.5 08/20/2019   PLT 199 08/20/2019   Recent Labs    07/23/19 0837 08/06/19 0840 08/20/19 0829  NA 141 140 138  K 4.1 4.5 4.5  CL 106 106 105  CO2 '26 27 26  '$ GLUCOSE 119* 107* 107*  BUN 22* 19 20  CREATININE 1.04 1.15 1.29*  CALCIUM 9.1 9.3 9.1  GFRNONAA >60 >60 >60  GFRAA >60 >60 >60  PROT 7.2 7.4 7.6  ALBUMIN 4.3 4.4 4.3  AST '26 26 25  '$ ALT 37 35 33  ALKPHOS 88 91 99  BILITOT 0.6 0.6 0.6   Iron/TIBC/Ferritin/ %Sat    Component Value Date/Time   IRON 59 08/20/2019 0829   TIBC 336 08/20/2019 0829   FERRITIN 55 08/20/2019 0829   IRONPCTSAT 18 08/20/2019 0829      RADIOGRAPHIC STUDIES: I have personally reviewed the radiological images as listed and agreed with the findings in the report. PERIPHERAL VASCULAR CATHETERIZATION  Result Date: 08/26/2019 See op note  US Venous Img Lower Bilateral (DVT)  Result Date: 06/09/2019 CLINICAL DATA:  Leg swelling for 1 month, history of left inguinal node dissection EXAM: Bilateral LOWER EXTREMITY VENOUS DOPPLER ULTRASOUND TECHNIQUE: Gray-scale sonography with compression, as well as color and duplex ultrasound, were performed to evaluate the deep venous system(s) from the level of the common femoral  vein through the popliteal and proximal calf veins. COMPARISON:  01/31/2019 FINDINGS: VENOUS Normal compressibility of the common femoral, superficial femoral, and popliteal veins, as well as the visualized calf veins. Visualized portions of profunda femoral vein and great saphenous vein unremarkable. No filling defects to suggest DVT on grayscale or color Doppler imaging. Doppler waveforms show normal direction of venous flow, normal respiratory phasicity and response to augmentation. OTHER Heterogenous elongated hypoechoic area with internal fluid at the left groin, poorly measurable. Complex cyst and solid mass measuring 3.2 x 1 point 9 x 2.7 cm. Limitations: none IMPRESSION: No femoropopliteal DVT nor evidence of DVT within the visualized calf veins. If clinical symptoms are inconsistent or if there are persistent or worsening symptoms, further imaging (possibly involving the iliac veins) may be warranted. Heterogenous hypoechoic area with internal fluid echogenicity at the left groin, potentially related to history of inguinal no dissection and representing postsurgical change. Complex cystic and solid mass measuring 3.2 cm, appears separate from the heterogenous hypoechoic tissue and may represent enlarged node with necrosis or infection. Electronically Signed   By: Donavan Foil M.D.   On: 06/09/2019 17:07       ASSESSMENT & PLAN:  1. Malignant melanoma of overlapping sites ([place])   2. Encounter for antineoplastic immunotherapy   3. Vitiligo   4. Port-A-Cath in place    #Left inguinal nodal recurrence of melanoma.  Status post resection and adjuvant radiation. Labs reviewed and discussed with patient. Counts acceptable to proceed with immunotherapy with nivolumab today.  He has  finished 4 cycles of immunotherapy. I will obtain CT chest abdomen pelvis to establish his post surgery baseline.   #Vitiligo, symptoms are stable.  Continue to monitor. #Normocytic anemia, stable hemoglobin.   Monitor. #Port-A-Cath in place.  Patient is getting treatments every 2 weeks.  No need for additional flush at this point.  All questions were answered. The patient knows to call the clinic with any problems questions or concerns.  Follow-up with 2 weeks. We spent sufficient time to discuss many aspect of care, questions were answered to patient's satisfaction.   [name], MD, PhD Hematology Oncology [facility] Pager- [phone] 09/03/2019

## 2019-09-03 NOTE — Progress Notes (Signed)
Pt here for follow up. No new concerns voiced.   

## 2019-09-03 NOTE — Therapy (Signed)
[facility] REH[facility] SERVICES [address] [place], [place], [address] Phone: [phone]   Fax:  [phone]  Occupational Therapy Treatment  Patient Details  Name: Edward Pitts MRN: [id number] Date of Birth: 01/07/1961 Referring Provider (OT): Edward Yu, MD   Encounter Date: 09/03/2019  OT End of Session - 09/03/19 1446    Visit Number  13    Number of Visits  36    Date for OT Re-Evaluation  10/22/19    OT Start Time  0100    OT Stop Time  0220    OT Time Calculation (min)  80 min    Activity Tolerance  Patient tolerated treatment well;No increased pain    Behavior During Therapy  WFL for tasks assessed/performed       Past Medical History:  Diagnosis Date  . Anxiety   . Arthritis   . Complication of anesthesia   . Family history of adverse reaction to anesthesia    PONV mother  . Hyperlipidemia   . Hypertension   . Melanoma (HCC) 2012  . PONV (postoperative nausea and vomiting) 04/16/2019  . Presence of permanent cardiac pacemaker    [employer]tronic  . Seizures (HCC)     Past Surgical History:  Procedure Laterality Date  . KNEE SURGERY Left   . LEFT HEART CATH AND CORONARY ANGIOGRAPHY Left 06/29/2017   Procedure: LEFT HEART CATH AND CORONARY ANGIOGRAPHY;  Surgeon: Kowalski, Bruce J, MD;  Location: [facility] INVASIVE CV LAB;  Service: Cardiovascular;  Laterality: Left;  . LYMPH NODE DISSECTION Left 04/16/2019   Procedure: Left inguinal Lymph Node Dissection;  Surgeon: Byerly, Faera, MD;  Location: [facility] OR;  Service: General;  Laterality: Left;  . MELANOMA EXCISION Left 04/16/2019   Procedure: MELANOMA EXCISION LEFT GROIN MASS;  Surgeon: Byerly, Faera, MD;  Location: [facility] OR;  Service: General;  Laterality: Left;  . MELANOMA EXCISION WITH SENTINEL LYMPH NODE BIOPSY Left 2012   Left calf   . PACEMAKER INSERTION N/A 08/26/2018   Procedure: INSERTION PACEMAKER;  Surgeon: Paraschos, Alexander, MD;  Location: [facility] ORS;  Service: Cardiovascular;   Laterality: N/A;  . PORTA CATH INSERTION N/A 08/26/2019   Procedure: PORTA CATH INSERTION;  Surgeon: Schnier, Gregory G, MD;  Location: [facility] INV[facility]IVE CV LAB;  Service: Cardiovascular;  Laterality: N/A;  . TEMPORARY PACEMAKER N/A 08/25/2018   Procedure: TEMPORARY PACEMAKER;  Surgeon: Cooper, Michael, MD;  Location: [facility] INVASIVE CV LAB;  Service: Cardiovascular;  Laterality: N/A;    There were no vitals filed for this visit.  Subjective Assessment - 09/03/19 1313    Subjective   Edward Pitts presents to OT for visit 13/36 to address LLE lymphedema 2/2 melanoma Rx. Pt presents without compression wraps in place. Pt reports fatigue after infusion this am and c/o LLE feeling "more swollen today". Pain not  numerically rated.                   OT Treatments/Exercises (OP) - 09/03/19 0001      ADLs   ADL Education Given  Yes      Manual Therapy   Manual Therapy  Edema management;Manual Lymphatic Drainage (MLD);Compression Bandaging    Manual Lymphatic Drainage (MLD)  MLDto LLE/LLQ in supine and sidelying utilizing short neck sequence, deep abdominals with diaphragmatic breathing, ipsilateral inguinal -axiillary anastomosis and proximal to distal leg sequence.     Compression Bandaging  thigh length multi layer gradient compression wraps to LLE- short stretch               OT Education - 09/03/19 1445    Education Details  Simple Self-MLD: Pt edu focus today on simple self MLD with body drawings illustrating  LN and deep and superficial vessels ,anchoring filaments  and channels. Used drawing with J Strokes to demonstrate  MLD's skin stretch's effect on mechanical lymph transport and lymphangiomotoricity. Good return.    Person(s) Educated  Patient;Spouse    Methods  Explanation;Demonstration;Handout;Tactile cues;Verbal cues    Comprehension  Verbalized understanding;Returned demonstration;Need further instruction;Verbal cues required;Tactile cues required          OT Long  Term Goals - 08/14/19 1627      OT LONG TERM GOAL #1   Title  Pt will be able to apply thigh length, multi-layer, short stretch compression wraps daily to single limb using correct gradient techniques with modified independence (extra time) to achieve optimal limb volume reduction, to return affected limb/s, as closely as possible, to premorbid size and shape, to limit infection risk, and to improve safe functional ambulation and mobility.   Pt able to apply gradient wraps with max CG assistance   Baseline  Max A    Time  4    Period  Days    Status  Achieved      OT LONG TERM GOAL #2   Title  Pt will be able to verbalize signs and symptoms of cellulitis infection and identify at least 4 common lymphedema precautions using printed resource for reference to limit LE progression over time to limit risk of infection and LE exacerbation.    Baseline  Max A    Time  4    Period  Days    Status  Partially Met      OT LONG TERM GOAL #3   Title  Pt will tolerate thigh length, multilayer short stretch compression wrap to single leg up to 23/ 7 to achieve optimal swelling reduction to arrest lymphedema progression and in preparation for fitting appropriate compression garments/ devices.    Baseline  Max A    Time  12    Period  Weeks    Status  Partially Met   Pt achieved ~50% compliance with compression wraps thus far.     OT LONG TERM GOAL #4   Title  Pt will achieve 10% or greater limb volume reduction to LLE from toes to groin to enable functional improvements including fitting into preferred footwear and clothing, decreased infection risk and improved body image.    Baseline  Max A    Time  12    Period  Weeks    Status  Partially Met   11% LLE limb volume reduction below the knee achieved 08/14/19. Overall limb volume reduction to date = 3%     OT LONG TERM GOAL #5   Title  Pt will achieve and sustain at least 85%  compliance with all daily LE self-care home program components, to  include skin care, lymphatic pumping therex, compression wraps and garments/devices, and simple self-MLD, to reduce and control limb swelling, reduce infection risk and limit LE progression.    Baseline  Max A    Time  12    Period  Weeks    Status  Partially Met      OT LONG TERM GOAL #6   Title  Pt will achieve modified independence ( extra time, assistive device) with donning and doffing day and night time compression garments and devices to limit re-accumulation of lymphatic congestion and progression of lymphedema.      Baseline  Max A    Time  12    Period  Weeks    Status  On-going            Plan - 09/03/19 1454    Clinical Impression Statement  MLD to LLE and LLQ directing flow towards deep abdominal pathways and ipsilateral inguinal - axillary anastamosis. Applied fibrosis techniques and myofasial release to decrease protien ritch density. Stubborn LLE lymphedema appears slightly reduced in thigh  at end of session, but pitting and dense edema persists distally. Cont as per POC.    OT Occupational Profile and History  Comprehensive Assessment- Review of records and extensive additional review of physical, cognitive, psychosocial history related to current functional performance    Occupational performance deficits (Please refer to evaluation for details):  ADL's;IADL's;Rest and Sleep;Work;Leisure;Social Participation;Other   body image, role performance   Body Structure / Function / Physical Skills  ADL;ROM;Scar mobility;IADL;Edema;Balance;Sensation;Skin integrity;Mobility;Flexibility;Strength;Decreased knowledge of precautions;Gait;Pain;Decreased knowledge of use of DME    Rehab Potential  Good    Clinical Decision Making  Several treatment options, min-mod task modification necessary    Comorbidities Affecting Occupational Performance:  Presence of comorbidities impacting occupational performance    Modification or Assistance to Complete Evaluation   Min-Moderate modification  of tasks or assist with assess necessary to complete eval    OT Frequency  2x / week    OT Duration  12 weeks   and PRN   OT Treatment/Interventions  Self-care/ADL training;Therapeutic exercise;Functional Mobility Training;Manual Therapy;Coping strategies training;Therapeutic activities;Manual lymph drainage;Energy conservation;DME and/or AE instruction;Compression bandaging;Other (comment);Scar mobilization;Patient/family education   fit with appropriate compression garments once swelling is reduced   Plan  Complete Decongestive Therapy (CDT), Intensive and Management Phases to include  manual lymphatyic drainage (MLD), skin care, therapeutic exercise, compression with short stretch wraps and garments, Pt edu for LE sef care    Consulted and Agree with Plan of Care  Patient       Patient will benefit from skilled therapeutic intervention in order to improve the following deficits and impairments:   Body Structure / Function / Physical Skills: ADL, ROM, Scar mobility, IADL, Edema, Balance, Sensation, Skin integrity, Mobility, Flexibility, Strength, Decreased knowledge of precautions, Gait, Pain, Decreased knowledge of use of DME       Visit Diagnosis: Lymphedema, not elsewhere classified    Problem List Patient Active Problem List   Diagnosis Date Noted  . Vitiligo 09/03/2019  . Port-A-Cath in place 08/20/2019  . Encounter for antineoplastic immunotherapy 08/06/2019  . Malignant melanoma of overlapping sites (HCC) 04/23/2019  . Goals of care, counseling/discussion 04/23/2019  . 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 Near syncope 12/17/2018  . Anemia in chronic kidney disease 12/11/2018  . Benign hypertensive kidney disease with chronic kidney disease 12/11/2018  . Stage 3a chronic kidney disease 12/11/2018  . AKI (acute kidney injury) (HCC) 08/24/2018  . Acute hyperkalemia 08/24/2018  . HTN (hypertension) 08/24/2018  . HLD (hyperlipidemia) 08/24/2018  .  Seizures (HCC) 08/24/2018  . Complete heart block (HCC) 08/24/2018  . Arthritis of wrist, left 06/12/2018  . Bradycardia 06/04/2018  . 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 Positive ANA (antinuclear antibody) 03/05/2018  . Swelling of joint of left wrist 03/05/2018  . Coronary artery disease involving native coronary artery of native heart 07/09/2017  . Palpitations 07/09/2017  . Stable angina (HCC) 06/27/2017  . Benign essential HTN 05/25/2017  . LBBB (left bundle branch block) 05/25/2017   Theresa Gilliam,   MS, OTR/L, CLT-LANA 09/03/19 3:02 PM   [facility] [address] [place], [place], [address] Phone: [phone]   Fax:  [phone]  Name: Edward Pitts MRN: [id number] Date of Birth: 10/25/1960 

## 2019-09-04 ENCOUNTER — Other Ambulatory Visit: Payer: Self-pay

## 2019-09-04 ENCOUNTER — Ambulatory Visit: Payer: BC Managed Care – PPO | Admitting: Occupational Therapy

## 2019-09-04 DIAGNOSIS — I890 Lymphedema, not elsewhere classified: Secondary | ICD-10-CM

## 2019-09-04 NOTE — Therapy (Signed)
[place] [facility] SERVICES [address] [place], [place], [address] Phone: [phone]   Fax:  [phone]  Occupational Therapy Treatment  Patient Details  Name: Edward Pitts MRN: [id number] Date of Birth: 05-06-1960 Referring Provider (OT): [name], MD   Encounter Date: 09/04/2019   OT End of Session - 09/04/19 1536    Visit Number 14    Number of Visits 36    Date for OT Re-Evaluation 10/22/19    OT Start Time 0200    OT Stop Time 0330    OT Time Calculation (min) 90 min    Activity Tolerance Patient tolerated treatment well;No increased pain    Behavior During Therapy WFL for tasks assessed/performed           Past Medical History:  Diagnosis Date  . Anxiety   . Arthritis   . Complication of anesthesia   . Family history of adverse reaction to anesthesia    PONV mother  . Hyperlipidemia   . Hypertension   . Melanoma ([place]) 2012  . PONV (postoperative nausea and vomiting) 04/16/2019  . Presence of permanent cardiac pacemaker    [employer]tr[employer]  . Seizures ([place])     Past Surgical History:  Procedure Laterality Date  . KNEE SURGERY Left   . LEFT HEART CATH AND CORONARY ANGIOGRAPHY Left 06/29/2017   Procedure: LEFT HEART CATH AND CORONARY ANGIOGRAPHY;  Surgeon: [name], MD;  Location: [place] CV LAB;  Service: Cardiovascular;  Laterality: Left;  . LYMPH NODE DISSECTION Left 04/16/2019   Procedure: Left inguinal Lymph Node Dissection;  Surgeon: [name], MD;  Location: [place];  Service: General;  Laterality: Left;  Marland Kitchen MELANOMA EXCISION Left 04/16/2019   Procedure: MELANOMA EXCISION LEFT GROIN MASS;  Surgeon: [name], MD;  Location: [place];  Service: General;  Laterality: Left;  Marland Kitchen MELANOMA EXCISION WITH SENTINEL LYMPH NODE BIOPSY Left 2012   Left calf   . PACEMAKER INSERTION N/A 08/26/2018   Procedure: INSERTION PACEMAKER;  Surgeon: [name], MD;  Location: [facility] ORS;  Service: Cardiovascular;   Laterality: N/A;  . PORTA CATH INSERTION N/A 08/26/2019   Procedure: PORTA CATH INSERTION;  Surgeon: Katha Cabal, MD;  Location: [place] CV LAB;  Service: Cardiovascular;  Laterality: N/A;  . TEMPORARY PACEMAKER N/A 08/25/2018   Procedure: TEMPORARY PACEMAKER;  Surgeon: [name], MD;  Location: [place] CV LAB;  Service: Cardiovascular;  Laterality: N/A;    There were no vitals filed for this visit.   Subjective Assessment - 09/04/19 1415    Subjective  Edward Pitts presents to OT for visit 13/36 to address LLE lymphedema 2/2 melanoma Rx. Pt presents with full length LLE compression wraps in place. "It didn't bother m. I didnt have to take it off."               LYMPHEDEMA/ONCOLOGY QUESTIONNAIRE - 09/04/19 0001      Left Lower Extremity Lymphedema   Other LLE A-D volume =3815.15 ml. RLE E-G volume = 6661.56m. LLE A-G volume = 10477.03 ml.    Other LLE leg volume (A-D) is reduced by 4.4% since last measured on 08/14/19, and by 15.11% since commencing CDT on 07/28/19. LLE thigh volume is decreased by 6.30% since last measured on 5/20, and by 4.16% since commencing LLE CDT. LLE full leg volume (A-G) is decreased by 5.62% since last measured on 08/14/19, and by 8.46% since commencing CDT on 07/28/19.  OT Treatments/Exercises (OP) - 09/04/19 0001      ADLs   ADL Education Given Yes      Manual Therapy   Manual Therapy Edema management;Manual Lymphatic Drainage (MLD);Compression Bandaging    Manual therapy comments LLE comparative limb volumetrics    Manual Lymphatic Drainage (MLD) MLDto LLE/LLQ in supine and sidelying utilizing short neck sequence, deep abdominals with diaphragmatic breathing, ipsilateral inguinal -axiillary anastomosis and proximal to distal leg sequence.     Compression Bandaging thigh length multi layer gradient compression wraps to LLE- short stretch                  OT Education - 09/04/19 1536    Education  Details Continued skilled Pt/caregiver education  And LE ADL training throughout visit for lymphedema self care/ home program, including compression wrapping, compression garment and device wear/care, lymphatic pumping ther ex, simple self-MLD, and skin care. Discussed progress towards goals.    Person(s) Educated Patient;Spouse    Methods Explanation;Demonstration;Handout;Tactile cues;Verbal cues    Comprehension Verbalized understanding;Returned demonstration;Need further instruction;Verbal cues required;Tactile cues required               OT Long Term Goals - 08/14/19 1627      OT LONG TERM GOAL #1   Title Pt will be able to apply thigh length, multi-layer, short stretch compression wraps daily to single limb using correct gradient techniques with modified independence (extra time) to achieve optimal limb volume reduction, to return affected limb/s, as closely as possible, to premorbid size and shape, to limit infection risk, and to improve safe functional ambulation and mobility.   Pt able to apply gradient wraps with max CG assistance   Baseline Max A    Time 4    Period Days    Status Achieved      OT LONG TERM GOAL #2   Title Pt will be able to verbalize signs and symptoms of cellulitis infection and identify at least 4 common lymphedema precautions using printed resource for reference to limit LE progression over time to limit risk of infection and LE exacerbation.    Baseline Max A    Time 4    Period Days    Status Partially Met      OT LONG TERM GOAL #3   Title Pt will tolerate thigh length, multilayer short stretch compression wrap to single leg up to 23/ 7 to achieve optimal swelling reduction to arrest lymphedema progression and in preparation for fitting appropriate compression garments/ devices.    Baseline Max A    Time 12    Period Weeks    Status Partially Met   Pt achieved ~50% compliance with compression wraps thus far.     OT LONG TERM GOAL #4   Title Pt will  achieve 10% or greater limb volume reduction to LLE from toes to groin to enable functional improvements including fitting into preferred footwear and clothing, decreased infection risk and improved body image.    Baseline Max A    Time 12    Period Weeks    Status Partially Met   11% LLE limb volume reduction below the knee achieved 08/14/19. Overall limb volume reduction to date = 3%     OT LONG TERM GOAL #5   Title Pt will achieve and sustain at least 85%  compliance with all daily LE self-care home program components, to include skin care, lymphatic pumping therex, compression wraps and garments/devices, and simple self-MLD, to reduce and control  limb swelling, reduce infection risk and limit LE progression.    Baseline Max A    Time 12    Period Weeks    Status Partially Met      OT LONG TERM GOAL #6   Title Pt will achieve modified independence ( extra time, assistive device) with donning and doffing day and night time compression garments and devices to limit re-accumulation of lymphatic congestion and progression of lymphedema.    Baseline Max A    Time 12    Period Weeks    Status On-going                 Plan - 09/04/19 1537    Clinical Impression Statement Pt able to tolerate full length compression wraps over night for the first time since commencing CDT. Limb volume is obviously decreased by visiual assessment so we agtreed to complete limb volumetrics. LLE leg volume (A-D) is reduced by 4.4% since last measured on 08/14/19, and by 15.11% since commencing CDT on 07/28/19. LLE thigh volume is decreased by 6.30% since last measured on 5/20, and by 4.16% since commencing LLE CDT. LLE full leg volume (A-G) is decreased by 5.62% since last measured on 08/14/19, and by 8.46% since commencing CDT on 07/28/19. Pt motivated by this progress today. Pt tolerated MLD and compression wraps , again to groin, without difficulty. Cont as per POC. Hopefully this progress noted today will boost  compliance with full time, full length compression wrapping. Cont as per POC.           Patient will benefit from skilled therapeutic intervention in order to improve the following deficits and impairments:           Visit Diagnosis: Lymphedema, not elsewhere classified    Problem List Patient Active Problem List   Diagnosis Date Noted  . Vitiligo 09/03/2019  . Port-A-Cath in place 08/20/2019  . Encounter for antineoplastic immunotherapy 08/06/2019  . Malignant melanoma of overlapping sites ([place]) 04/23/2019  . Goals of care, counseling/discussion 04/23/2019  . 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 Near syncope 12/17/2018  . Anemia in chronic kidney disease 12/11/2018  . Benign hypertensive kidney disease with chronic kidney disease 12/11/2018  . Stage 3a chronic kidney disease 12/11/2018  . AKI (acute kidney injury) ([place]) 08/24/2018  . Acute hyperkalemia 08/24/2018  . HTN (hypertension) 08/24/2018  . HLD (hyperlipidemia) 08/24/2018  . Seizures ([place]) 08/24/2018  . Complete heart block ([place]) 08/24/2018  . Arthritis of wrist, left 06/12/2018  . Bradycardia 06/04/2018  . 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 Positive ANA (antinuclear antibody) 03/05/2018  . Swelling of joint of left wrist 03/05/2018  . Coronary artery disease involving native coronary artery of native heart 07/09/2017  . Palpitations 07/09/2017  . Stable angina ([place]) 06/27/2017  . Benign essential HTN 05/25/2017  . LBBB (left bundle branch block) 05/25/2017    Andrey Spearman, MS, OTR/L, [facility] 09/04/19 3:41 PM  [place] MAIN [facility] SERVICES [address] [place], [place], [address] Phone: [phone]   Fax:  [phone]  Name: Spence Soberano MRN: [id number] Date of Birth: 10-16-1960

## 2019-09-08 ENCOUNTER — Ambulatory Visit: Payer: BC Managed Care – PPO | Admitting: Occupational Therapy

## 2019-09-08 ENCOUNTER — Other Ambulatory Visit: Payer: Self-pay

## 2019-09-08 DIAGNOSIS — I890 Lymphedema, not elsewhere classified: Secondary | ICD-10-CM

## 2019-09-08 NOTE — Therapy (Signed)
[place] MAIN [facility] SERVICES [address] [place], [place], [address] Phone: [phone]   Fax:  [phone]  Occupational Therapy Treatment  Patient Details  Name: Edward Pitts MRN: [id number] Date of Birth: Mar 17, 1961 Referring Provider (OT): [name], MD   Encounter Date: 09/08/2019   OT End of Session - 09/08/19 1624    Visit Number 15    Number of Visits 36    Date for OT Re-Evaluation 10/22/19    OT Start Time 0315    OT Stop Time 0415    OT Time Calculation (min) 60 min    Activity Tolerance Patient tolerated treatment well;No increased pain    Behavior During Therapy WFL for tasks assessed/performed           Past Medical History:  Diagnosis Date  . Anxiety   . Arthritis   . Complication of anesthesia   . Family history of adverse reaction to anesthesia    PONV mother  . Hyperlipidemia   . Hypertension   . Melanoma ([place]) 2012  . PONV (postoperative nausea and vomiting) 04/16/2019  . Presence of permanent cardiac pacemaker    Medtronic  . Seizures ([place])     Past Surgical History:  Procedure Laterality Date  . KNEE SURGERY Left   . LEFT HEART CATH AND CORONARY ANGIOGRAPHY Left 06/29/2017   Procedure: LEFT HEART CATH AND CORONARY ANGIOGRAPHY;  Surgeon: [name], MD;  Location: [place] [facility];  Service: Cardiovascular;  Laterality: Left;  . LYMPH NODE DISSECTION Left 04/16/2019   Procedure: Left inguinal Lymph Node Dissection;  Surgeon: [name], MD;  Location: [place];  Service: General;  Laterality: Left;  Marland Kitchen MELANOMA EXCISION Left 04/16/2019   Procedure: MELANOMA EXCISION LEFT GROIN MASS;  Surgeon: [name], MD;  Location: [place];  Service: General;  Laterality: Left;  Marland Kitchen MELANOMA EXCISION WITH SENTINEL LYMPH NODE BIOPSY Left 2012   Left calf   . PACEMAKER INSERTION N/A 08/26/2018   Procedure: INSERTION PACEMAKER;  Surgeon: [name], MD;  Location: [facility];  Service: Cardiovascular;   Laterality: N/A;  . PORTA CATH INSERTION N/A 08/26/2019   Procedure: PORTA CATH INSERTION;  Surgeon: Katha Cabal, MD;  Location: [place] [facility];  Service: Cardiovascular;  Laterality: N/A;  . TEMPORARY PACEMAKER N/A 08/25/2018   Procedure: TEMPORARY PACEMAKER;  Surgeon: [name], MD;  Location: [place] [facility];  Service: Cardiovascular;  Laterality: N/A;    There were no vitals filed for this visit.   Subjective Assessment - 09/08/19 1526    Subjective  Edward Pitts presents to OT for visit 14/36 to address LLE lymphedema 2/2 melanoma Rx. Pt presents with full length LLE compression wraps in place. Pt reports 7/10 L knee pain.                        OT Treatments/Exercises (OP) - 09/08/19 0001      ADLs   ADL Education Given Yes (P)       Manual Therapy   Manual Therapy Edema management;Compression Bandaging;Manual Lymphatic Drainage (MLD) (P)     Manual Lymphatic Drainage (MLD) MLDto LLE/LLQ in supine and sidelying utilizing short neck sequence, deep abdominals with diaphragmatic breathing, ipsilateral inguinal -axiillary anastomosis and proximal to distal leg sequence.  (P)     Compression Bandaging thigh length multi layer gradient compression wraps to LLE- short stretch (P)  OT Education - 09/08/19 1623    Education Details Continued skilled Pt/caregiver education  And LE ADL training throughout visit for lymphedema self care/ home program, including compression wrapping, compression garment and device wear/care, lymphatic pumping ther ex, simple self-MLD, and skin care. Discussed progress towards goals.    Person(s) Educated Patient;Spouse    Methods Explanation;Demonstration;Handout    Comprehension Verbalized understanding;Returned demonstration;Need further instruction               OT Long Term Goals - 08/14/19 1627      OT LONG TERM GOAL #1   Title Pt will be able to apply thigh length, multi-layer, short  stretch compression wraps daily to single limb using correct gradient techniques with modified independence (extra time) to achieve optimal limb volume reduction, to return affected limb/s, as closely as possible, to premorbid size and shape, to limit infection risk, and to improve safe functional ambulation and mobility.   Pt able to apply gradient wraps with max CG assistance   Baseline Max A    Time 4    Period Days    Status Achieved      OT LONG TERM GOAL #2   Title Pt will be able to verbalize signs and symptoms of cellulitis infection and identify at least 4 common lymphedema precautions using printed resource for reference to limit LE progression over time to limit risk of infection and LE exacerbation.    Baseline Max A    Time 4    Period Days    Status Partially Met      OT LONG TERM GOAL #3   Title Pt will tolerate thigh length, multilayer short stretch compression wrap to single leg up to 23/ 7 to achieve optimal swelling reduction to arrest lymphedema progression and in preparation for fitting appropriate compression garments/ devices.    Baseline Max A    Time 12    Period Weeks    Status Partially Met   Pt achieved ~50% compliance with compression wraps thus far.     OT LONG TERM GOAL #4   Title Pt will achieve 10% or greater limb volume reduction to LLE from toes to groin to enable functional improvements including fitting into preferred footwear and clothing, decreased infection risk and improved body image.    Baseline Max A    Time 12    Period Weeks    Status Partially Met   11% LLE limb volume reduction below the knee achieved 08/14/19. Overall limb volume reduction to date = 3%     OT LONG TERM GOAL #5   Title Pt will achieve and sustain at least 85%  compliance with all daily LE self-care home program components, to include skin care, lymphatic pumping therex, compression wraps and garments/devices, and simple self-MLD, to reduce and control limb swelling, reduce  infection risk and limit LE progression.    Baseline Max A    Time 12    Period Weeks    Status Partially Met      OT LONG TERM GOAL #6   Title Pt will achieve modified independence ( extra time, assistive device) with donning and doffing day and night time compression garments and devices to limit re-accumulation of lymphatic congestion and progression of lymphedema.    Baseline Max A    Time 12    Period Weeks    Status On-going                 Plan - 09/08/19 1625  Clinical Impression Statement MLD to LLE and LLQ directing flow towards deep abdominal pathways and ipsilateral inguinal - axillary anastamosis. Applied fibrosis techniques and myofasial release fibrosis in foot and lateral ankle. Pt tolerated manua therapy well and reports no LLE pain at end of session. Cont as per POC.    OT Occupational Profile and History Comprehensive Assessment- Review of records and extensive additional review of physical, cognitive, psychosocial history related to current functional performance    Occupational performance deficits (Please refer to evaluation for details): ADL's;IADL's;Rest and Sleep;Work;Leisure;Social Participation;Other   body image, role performance   Body Structure / Function / Physical Skills ADL;ROM;Scar mobility;IADL;Edema;Balance;Sensation;Skin integrity;Mobility;Flexibility;Strength;Decreased knowledge of precautions;Gait;Pain;Decreased knowledge of use of DME    Rehab Potential Good    Clinical Decision Making Several treatment options, min-mod task modification necessary    Comorbidities Affecting Occupational Performance: Presence of comorbidities impacting occupational performance    Modification or Assistance to Complete Evaluation  Min-Moderate modification of tasks or assist with assess necessary to complete eval    OT Frequency 2x / week    OT Duration 12 weeks   and PRN   OT Treatment/Interventions Self-care/ADL training;Therapeutic exercise;Functional  Mobility Training;Manual Therapy;Coping strategies training;Therapeutic activities;Manual lymph drainage;Energy conservation;DME and/or AE instruction;Compression bandaging;Other (comment);Scar mobilization;Patient/family education   fit with appropriate compression garments once swelling is reduced   Plan Complete Decongestive Therapy (CDT), Intensive and Management Phases to include  manual lymphatyic drainage (MLD), skin care, therapeutic exercise, compression with short stretch wraps and garments, Pt edu for LE sef care    Consulted and Agree with Plan of Care Patient           Patient will benefit from skilled therapeutic intervention in order to improve the following deficits and impairments:   Body Structure / Function / Physical Skills: ADL, ROM, Scar mobility, IADL, Edema, Balance, Sensation, Skin integrity, Mobility, Flexibility, Strength, Decreased knowledge of precautions, Gait, Pain, Decreased knowledge of use of DME       Visit Diagnosis: Lymphedema, not elsewhere classified    Problem List Patient Active Problem List   Diagnosis Date Noted  . Vitiligo 09/03/2019  . Port-A-Cath in place 08/20/2019  . Encounter for antineoplastic immunotherapy 08/06/2019  . Malignant melanoma of overlapping sites ([place]) 04/23/2019  . Goals of care, counseling/discussion 04/23/2019  . 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 Near syncope 12/17/2018  . Anemia in chronic kidney disease 12/11/2018  . Benign hypertensive kidney disease with chronic kidney disease 12/11/2018  . Stage 3a chronic kidney disease 12/11/2018  . AKI (acute kidney injury) ([place]) 08/24/2018  . Acute hyperkalemia 08/24/2018  . HTN (hypertension) 08/24/2018  . HLD (hyperlipidemia) 08/24/2018  . Seizures ([place]) 08/24/2018  . Complete heart block ([place]) 08/24/2018  . Arthritis of wrist, left 06/12/2018  . Bradycardia 06/04/2018  . 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 Positive  ANA (antinuclear antibody) 03/05/2018  . Swelling of joint of left wrist 03/05/2018  . Coronary artery disease involving native coronary artery of native heart 07/09/2017  . Palpitations 07/09/2017  . Stable angina ([place]) 06/27/2017  . Benign essential HTN 05/25/2017  . LBBB (left bundle branch block) 05/25/2017    Andrey Spearman, MS, OTR/L, [facility] 09/08/19 4:27 PM  [place] MAIN [facility] SERVICES [address] [place], [place], [address] Phone: [phone]   Fax:  [phone]  Name: Sajad Glander MRN: [id number] Date of Birth: 1960-10-02

## 2019-09-09 ENCOUNTER — Ambulatory Visit: Payer: BC Managed Care – PPO | Admitting: Physical Therapy

## 2019-09-10 ENCOUNTER — Ambulatory Visit
Admission: RE | Admit: 2019-09-10 | Discharge: 2019-09-10 | Disposition: A | Discharge status: Home / Self Care | Source: Ambulatory Visit | Attending: Oncology | Admitting: Oncology

## 2019-09-10 ENCOUNTER — Ambulatory Visit: Payer: BC Managed Care – PPO | Admitting: Occupational Therapy

## 2019-09-10 ENCOUNTER — Other Ambulatory Visit: Payer: Self-pay

## 2019-09-10 DIAGNOSIS — I890 Lymphedema, not elsewhere classified: Secondary | ICD-10-CM

## 2019-09-10 DIAGNOSIS — C438 Malignant melanoma of overlapping sites of skin: Secondary | ICD-10-CM | POA: Diagnosis not present

## 2019-09-10 MED ORDER — IOHEXOL 300 MG/ML  SOLN
100.0000 mL | Freq: Once | INTRAMUSCULAR | Status: AC | PRN
  Administered 2019-09-10: 100 mL via INTRAVENOUS

## 2019-09-10 NOTE — Therapy (Signed)
[facility] MAIN [facility] SERVICES [address] [place], [place], [address] Phone: [phone]   Fax:  [phone]  Occupational Therapy Treatment  Patient Details  Name: Edward Pitts MRN: [id number] Date of Birth: 08-12-1960 Referring Provider (OT): Rickard Patience, MD   Encounter Date: 09/10/2019   OT End of Session - 09/10/19 1544    Visit Number 16    Number of Visits 36    Date for OT Re-Evaluation 10/22/19    OT Start Time 0300    OT Stop Time 0400    OT Time Calculation (min) 60 min    Equipment Utilized During Treatment [employer] Flexitouch sequential pneumatic compression device , ("pump")    Activity Tolerance Patient tolerated treatment well;No increased pain    Behavior During Therapy WFL for tasks assessed/performed           Past Medical History:  Diagnosis Date  . Anxiety   . Arthritis   . Complication of anesthesia   . Family history of adverse reaction to anesthesia    PONV mother  . Hyperlipidemia   . Hypertension   . Melanoma ([facility]) 2012  . PONV (postoperative nausea and vomiting) 04/16/2019  . Presence of permanent cardiac pacemaker    Medtronic  . Seizures ([facility])     Past Surgical History:  Procedure Laterality Date  . KNEE SURGERY Left   . LEFT HEART CATH AND CORONARY ANGIOGRAPHY Left 06/29/2017   Procedure: LEFT HEART CATH AND CORONARY ANGIOGRAPHY;  Surgeon: Lamar Blinks, MD;  Location: [facility];  Service: Cardiovascular;  Laterality: Left;  . LYMPH NODE DISSECTION Left 04/16/2019   Procedure: Left inguinal Lymph Node Dissection;  Surgeon: Almond Lint, MD;  Location: [facility];  Service: General;  Laterality: Left;  Marland Kitchen MELANOMA EXCISION Left 04/16/2019   Procedure: MELANOMA EXCISION LEFT GROIN MASS;  Surgeon: Almond Lint, MD;  Location: [facility];  Service: General;  Laterality: Left;  Marland Kitchen MELANOMA EXCISION WITH SENTINEL LYMPH NODE BIOPSY Left 2012   Left calf   . PACEMAKER INSERTION N/A 08/26/2018    Procedure: INSERTION PACEMAKER;  Surgeon: Marcina Millard, MD;  Location: [facility];  Service: Cardiovascular;  Laterality: N/A;  . PORTA CATH INSERTION N/A 08/26/2019   Procedure: PORTA CATH INSERTION;  Surgeon: Renford Dills, MD;  Location: [facility];  Service: Cardiovascular;  Laterality: N/A;  . TEMPORARY PACEMAKER N/A 08/25/2018   Procedure: TEMPORARY PACEMAKER;  Surgeon: Tonny Bollman, MD;  Location: [facility];  Service: Cardiovascular;  Laterality: N/A;    There were no vitals filed for this visit.   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 Pt has no new complaints.    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 w/ LN disection (-7/7 LN); Completed adjuvant XRT 07/07/2019; 08/2018 Pacemaker placed; L knee sx date?;    Limitations chronic LLE knee pain; post surgical LLE pain,  chronic LLE/LLQ swelling, decreased LLE strength, impaired gait, decreased dynamic balance, altered sensation LLE, increased risk of infection, decreased standing and walking tolerance > 1 hr; decreased ankle AROM    Repetition Increases Symptoms    Special Tests +Stemmer base L toes    Patient Stated Goals reduce  swelling to normal and keep it from getting worse                        OT Treatments/Exercises (OP) - 09/10/19 0001      ADLs   ADL Education Given Yes      Manual Therapy   Manual Therapy Edema management;Compression Bandaging;Manual Lymphatic Drainage (MLD)    Manual therapy comments trial in clinic w/ advanced, sequential pneumatic compression device- Flexitouch- 32 chambers- proximal to distal.    Compression Bandaging thigh length  multi layer gradient compression wraps to LLE- short stretch                       OT Long Term Goals - 08/14/19 1627      OT LONG TERM GOAL #1   Title Pt will be able to apply thigh length, multi-layer, short stretch compression wraps daily to single limb using correct gradient techniques with modified independence (extra time) to achieve optimal limb volume reduction, to return affected limb/s, as closely as possible, to premorbid size and shape, to limit infection risk, and to improve safe functional ambulation and mobility.   Pt able to apply gradient wraps with max CG assistance   Baseline Max A    Time 4    Period Days    Status Achieved      OT LONG TERM GOAL #2   Title Pt will be able to verbalize signs and symptoms of cellulitis infection and identify at least 4 common lymphedema precautions using printed resource for reference to limit LE progression over time to limit risk of infection and LE exacerbation.    Baseline Max A    Time 4    Period Days    Status Partially Met      OT LONG TERM GOAL #3   Title Pt will tolerate thigh length, multilayer short stretch compression wrap to single leg up to 23/ 7 to achieve optimal swelling reduction to arrest lymphedema progression and in preparation for fitting appropriate compression garments/ devices.    Baseline Max A    Time 12    Period Weeks    Status Partially Met   Pt achieved ~50% compliance with compression wraps thus far.     OT LONG TERM GOAL #4   Title Pt will achieve 10% or greater limb volume reduction to LLE from toes to groin to enable functional improvements including fitting into preferred footwear and clothing, decreased infection risk and improved body image.    Baseline Max A    Time 12    Period Weeks    Status Partially Met   11% LLE limb volume reduction below the knee achieved 08/14/19. Overall limb volume reduction to date = 3%     OT LONG TERM GOAL #5   Title Pt will achieve and sustain at  least 85%  compliance with all daily LE self-care home program components, to include skin care, lymphatic pumping therex, compression wraps and garments/devices, and simple self-MLD, to reduce and control limb swelling, reduce infection risk and limit LE progression.    Baseline Max A    Time 12    Period Weeks    Status Partially Met      OT LONG TERM GOAL #6   Title Pt will achieve modified independence ( extra time, assistive device) with donning and doffing day and night time compression garments and devices to limit re-accumulation of lymphatic congestion and  progression of lymphedema.    Baseline Max A    Time 12    Period Weeks    Status On-going                 Plan - 09/10/19 1546    Clinical Impression Statement Pt demonstrates a response to CDT after 3 weeks of treatment as evidenced by slowly decreased swelling below the knee and decreased tissue density. The Flexitouch pneumatic compression device is medically necessary for this long term daily in-home use to help manage symptoms of chronic, progressive lymphedema on a daily basis to limit progression and further functional decline    OT Occupational Profile and History Comprehensive Assessment- Review of records and extensive additional review of physical, cognitive, psychosocial history related to current functional performance    Occupational performance deficits (Please refer to evaluation for details): ADL's;IADL's;Rest and Sleep;Work;Leisure;Social Participation;Other   body image, role performance   Body Structure / Function / Physical Skills ADL;ROM;Scar mobility;IADL;Edema;Balance;Sensation;Skin integrity;Mobility;Flexibility;Strength;Decreased knowledge of precautions;Gait;Pain;Decreased knowledge of use of DME    Rehab Potential Good    Clinical Decision Making Several treatment options, min-mod task modification necessary    Comorbidities Affecting Occupational Performance: Presence of comorbidities impacting  occupational performance    Modification or Assistance to Complete Evaluation  Min-Moderate modification of tasks or assist with assess necessary to complete eval    OT Frequency 2x / week    OT Duration 12 weeks   and PRN   OT Treatment/Interventions Self-care/ADL training;Therapeutic exercise;Functional Mobility Training;Manual Therapy;Coping strategies training;Therapeutic activities;Manual lymph drainage;Energy conservation;DME and/or AE instruction;Compression bandaging;Other (comment);Scar mobilization;Patient/family education   fit with appropriate compression garments once swelling is reduced   Plan Complete Decongestive Therapy (CDT), Intensive and Management Phases to include  manual lymphatyic drainage (MLD), skin care, therapeutic exercise, compression with short stretch wraps and garments, Pt edu for LE sef care    Consulted and Agree with Plan of Care Patient           Patient will benefit from skilled therapeutic intervention in order to improve the following deficits and impairments:   Body Structure / Function / Physical Skills: ADL, ROM, Scar mobility, IADL, Edema, Balance, Sensation, Skin integrity, Mobility, Flexibility, Strength, Decreased knowledge of precautions, Gait, Pain, Decreased knowledge of use of DME       Visit Diagnosis: Lymphedema, not elsewhere classified    Problem List Patient Active Problem List   Diagnosis Date Noted  . Vitiligo 09/03/2019  . Port-A-Cath in place 08/20/2019  . Encounter for antineoplastic immunotherapy 08/06/2019  . Malignant melanoma of overlapping sites ([place]) 04/23/2019  . Goals of care, counseling/discussion 04/23/2019  . 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 Near syncope 12/17/2018  . Anemia in chronic kidney disease 12/11/2018  . Benign hypertensive kidney disease with chronic kidney disease 12/11/2018  . Stage 3a chronic kidney disease 12/11/2018  . AKI (acute kidney injury) ([place]) 08/24/2018   . Acute hyperkalemia 08/24/2018  . HTN (hypertension) 08/24/2018  . HLD (hyperlipidemia) 08/24/2018  . Seizures ([place]) 08/24/2018  . Complete heart block ([place]) 08/24/2018  . Arthritis of wrist, left 06/12/2018  . Bradycardia 06/04/2018  . 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 Positive ANA (antinuclear antibody) 03/05/2018  . Swelling of joint of left wrist 03/05/2018  . Coronary artery disease involving native coronary artery of native heart 07/09/2017  . Palpitations 07/09/2017  . Stable angina ([place]) 06/27/2017  . Benign essential HTN 05/25/2017  . LBBB (left bundle branch block) 05/25/2017  Andrey Spearman, MS, OTR/L, [facility] 09/10/19 3:47 PM  [place] MAIN [facility] SERVICES [address] [place], [place], [address] Phone: [phone]   Fax:  [phone]  Name: Edward Pitts MRN: [id number] Date of Birth: 1960/09/17

## 2019-09-11 ENCOUNTER — Ambulatory Visit: Payer: BC Managed Care – PPO | Admitting: Occupational Therapy

## 2019-09-11 DIAGNOSIS — I890 Lymphedema, not elsewhere classified: Secondary | ICD-10-CM

## 2019-09-11 NOTE — Therapy (Signed)
[place] MAIN [facility] SERVICES [address] [place], [place], [address] Phone: [phone]   Fax:  [phone]  Occupational Therapy Treatment  Patient Details  Name: Edward Pitts MRN: [id number] Date of Birth: 06-Jul-1960 Referring Provider (OT): [name], MD   Encounter Date: 09/11/2019   OT End of Session - 09/11/19 1539    Visit Number 17    Number of Visits 36    Date for OT Re-Evaluation 10/22/19    OT Start Time 0900    OT Stop Time 1008    OT Time Calculation (min) 68 min    Equipment Utilized During Treatment Tactile Medical Flexitouch sequential pneumatic compression device , ("pump")    Activity Tolerance Patient tolerated treatment well;No increased pain    Behavior During Therapy WFL for tasks assessed/performed           Past Medical History:  Diagnosis Date  . Anxiety   . Arthritis   . Complication of anesthesia   . Family history of adverse reaction to anesthesia    PONV mother  . Hyperlipidemia   . Hypertension   . Melanoma ([place]) 2012  . PONV (postoperative nausea and vomiting) 04/16/2019  . Presence of permanent cardiac pacemaker    [employer]  . Seizures ([place])     Past Surgical History:  Procedure Laterality Date  . KNEE SURGERY Left   . LEFT HEART CATH AND CORONARY ANGIOGRAPHY Left 06/29/2017   Procedure: LEFT HEART CATH AND CORONARY ANGIOGRAPHY;  Surgeon: [name], MD;  Location: [place] [facility];  Service: Cardiovascular;  Laterality: Left;  . LYMPH NODE DISSECTION Left 04/16/2019   Procedure: Left inguinal Lymph Node Dissection;  Surgeon: [name], MD;  Location: [place];  Service: General;  Laterality: Left;  Marland Kitchen MELANOMA EXCISION Left 04/16/2019   Procedure: MELANOMA EXCISION LEFT GROIN MASS;  Surgeon: [name], MD;  Location: [place];  Service: General;  Laterality: Left;  Marland Kitchen MELANOMA EXCISION WITH SENTINEL LYMPH NODE BIOPSY Left 2012   Left calf   . PACEMAKER INSERTION N/A 08/26/2018    Procedure: INSERTION PACEMAKER;  Surgeon: [name], MD;  Location: [facility];  Service: Cardiovascular;  Laterality: N/A;  . PORTA CATH INSERTION N/A 08/26/2019   Procedure: PORTA CATH INSERTION;  Surgeon: Katha Cabal, MD;  Location: [place] [facility];  Service: Cardiovascular;  Laterality: N/A;  . TEMPORARY PACEMAKER N/A 08/25/2018   Procedure: TEMPORARY PACEMAKER;  Surgeon: [name], MD;  Location: [place] [facility];  Service: Cardiovascular;  Laterality: N/A;    There were no vitals filed for this visit.   Subjective Assessment - 09/11/19 1538    Subjective  Edward Pitts presents to OT for visit 16/36 to address LLE lymphedema 2/2 melanoma Rx. Pt presents with full length LLE compression wraps in place. Pt does not rate LLE pain today.                        OT Treatments/Exercises (OP) - 09/11/19 0001      ADLs   ADL Education Given Yes      Manual Therapy   Manual Therapy Edema management;Compression Bandaging;Manual Lymphatic Drainage (MLD)    Manual Lymphatic Drainage (MLD) MLDto LLE/LLQ in supine and sidelying utilizing short neck sequence, deep abdominals with diaphragmatic breathing, ipsilateral inguinal -axiillary anastomosis and proximal to distal leg sequence.     Compression Bandaging thigh length multi layer gradient compression wraps to LLE- short stretch  OT Education - 09/11/19 1538    Education Details Continued skilled Pt/caregiver education  And LE ADL training throughout visit for lymphedema self care/ home program, including compression wrapping, compression garment and device wear/care, lymphatic pumping ther ex, simple self-MLD, and skin care. Discussed progress towards goals.    Person(s) Educated Patient;Spouse    Methods Explanation;Demonstration;Handout    Comprehension Verbalized understanding;Returned demonstration;Need further instruction               OT Long Term Goals - 08/14/19  1627      OT LONG TERM GOAL #1   Title Pt will be able to apply thigh length, multi-layer, short stretch compression wraps daily to single limb using correct gradient techniques with modified independence (extra time) to achieve optimal limb volume reduction, to return affected limb/s, as closely as possible, to premorbid size and shape, to limit infection risk, and to improve safe functional ambulation and mobility.   Pt able to apply gradient wraps with max CG assistance   Baseline Max A    Time 4    Period Days    Status Achieved      OT LONG TERM GOAL #2   Title Pt will be able to verbalize signs and symptoms of cellulitis infection and identify at least 4 common lymphedema precautions using printed resource for reference to limit LE progression over time to limit risk of infection and LE exacerbation.    Baseline Max A    Time 4    Period Days    Status Partially Met      OT LONG TERM GOAL #3   Title Pt will tolerate thigh length, multilayer short stretch compression wrap to single leg up to 23/ 7 to achieve optimal swelling reduction to arrest lymphedema progression and in preparation for fitting appropriate compression garments/ devices.    Baseline Max A    Time 12    Period Weeks    Status Partially Met   Pt achieved ~50% compliance with compression wraps thus far.     OT LONG TERM GOAL #4   Title Pt will achieve 10% or greater limb volume reduction to LLE from toes to groin to enable functional improvements including fitting into preferred footwear and clothing, decreased infection risk and improved body image.    Baseline Max A    Time 12    Period Weeks    Status Partially Met   11% LLE limb volume reduction below the knee achieved 08/14/19. Overall limb volume reduction to date = 3%     OT LONG TERM GOAL #5   Title Pt will achieve and sustain at least 85%  compliance with all daily LE self-care home program components, to include skin care, lymphatic pumping therex,  compression wraps and garments/devices, and simple self-MLD, to reduce and control limb swelling, reduce infection risk and limit LE progression.    Baseline Max A    Time 12    Period Weeks    Status Partially Met      OT LONG TERM GOAL #6   Title Pt will achieve modified independence ( extra time, assistive device) with donning and doffing day and night time compression garments and devices to limit re-accumulation of lymphatic congestion and progression of lymphedema.    Baseline Max A    Time 12    Period Weeks    Status On-going                 Plan - 09/11/19 1541  Clinical Impression Statement MLD to LLE and LLQ directing flow towards deep abdominal pathways and ipsilateral inguinal - axillary anastamosis. Applied fibrosis techniques and myofasial release to decrease protien ritch density. Stubborn LLE lymphedema appears slightly reduced in thigh  at end of session, but pitting and dense edema persists distally. Cont as per POC.    OT Occupational Profile and History Comprehensive Assessment- Review of records and extensive additional review of physical, cognitive, psychosocial history related to current functional performance    Occupational performance deficits (Please refer to evaluation for details): ADL's;IADL's;Rest and Sleep;Work;Leisure;Social Participation;Other   body image, role performance   Body Structure / Function / Physical Skills ADL;ROM;Scar mobility;IADL;Edema;Balance;Sensation;Skin integrity;Mobility;Flexibility;Strength;Decreased knowledge of precautions;Gait;Pain;Decreased knowledge of use of DME    Rehab Potential Good    Clinical Decision Making Several treatment options, min-mod task modification necessary    Comorbidities Affecting Occupational Performance: Presence of comorbidities impacting occupational performance    Modification or Assistance to Complete Evaluation  Min-Moderate modification of tasks or assist with assess necessary to complete eval     OT Frequency 2x / week    OT Duration 12 weeks   and PRN   OT Treatment/Interventions Self-care/ADL training;Therapeutic exercise;Functional Mobility Training;Manual Therapy;Coping strategies training;Therapeutic activities;Manual lymph drainage;Energy conservation;DME and/or AE instruction;Compression bandaging;Other (comment);Scar mobilization;Patient/family education   fit with appropriate compression garments once swelling is reduced   Plan Complete Decongestive Therapy (CDT), Intensive and Management Phases to include  manual lymphatyic drainage (MLD), skin care, therapeutic exercise, compression with short stretch wraps and garments, Pt edu for LE sef care    Consulted and Agree with Plan of Care Patient           Patient will benefit from skilled therapeutic intervention in order to improve the following deficits and impairments:   Body Structure / Function / Physical Skills: ADL, ROM, Scar mobility, IADL, Edema, Balance, Sensation, Skin integrity, Mobility, Flexibility, Strength, Decreased knowledge of precautions, Gait, Pain, Decreased knowledge of use of DME       Visit Diagnosis: Lymphedema, not elsewhere classified    Problem List Patient Active Problem List   Diagnosis Date Noted  . Vitiligo 09/03/2019  . Port-A-Cath in place 08/20/2019  . Encounter for antineoplastic immunotherapy 08/06/2019  . Malignant melanoma of overlapping sites ([place]) 04/23/2019  . Goals of care, counseling/discussion 04/23/2019  . 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 Near syncope 12/17/2018  . Anemia in chronic kidney disease 12/11/2018  . Benign hypertensive kidney disease with chronic kidney disease 12/11/2018  . Stage 3a chronic kidney disease 12/11/2018  . AKI (acute kidney injury) ([place]) 08/24/2018  . Acute hyperkalemia 08/24/2018  . HTN (hypertension) 08/24/2018  . HLD (hyperlipidemia) 08/24/2018  . Seizures ([place]) 08/24/2018  . Complete heart block ([place])  08/24/2018  . Arthritis of wrist, left 06/12/2018  . Bradycardia 06/04/2018  . 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 Positive ANA (antinuclear antibody) 03/05/2018  . Swelling of joint of left wrist 03/05/2018  . Coronary artery disease involving native coronary artery of native heart 07/09/2017  . Palpitations 07/09/2017  . Stable angina ([place]) 06/27/2017  . Benign essential HTN 05/25/2017  . LBBB (left bundle branch block) 05/25/2017    Andrey Spearman, MS, OTR/L, [facility] 09/11/19 3:42 PM  [place] MAIN [facility] SERVICES [address] [place], [place], [address] Phone: [phone]   Fax:  [phone]  Name: Edward Pitts MRN: [id number] Date of Birth: [birth year]

## 2019-09-15 ENCOUNTER — Other Ambulatory Visit: Payer: Self-pay

## 2019-09-15 ENCOUNTER — Ambulatory Visit: Payer: BC Managed Care – PPO | Admitting: Occupational Therapy

## 2019-09-15 DIAGNOSIS — I890 Lymphedema, not elsewhere classified: Secondary | ICD-10-CM

## 2019-09-16 ENCOUNTER — Ambulatory Visit: Payer: BC Managed Care – PPO | Admitting: Physical Therapy

## 2019-09-17 ENCOUNTER — Other Ambulatory Visit: Payer: Self-pay

## 2019-09-17 ENCOUNTER — Inpatient Hospital Stay (HOSPITAL_BASED_OUTPATIENT_CLINIC_OR_DEPARTMENT_OTHER): Admitting: Oncology

## 2019-09-17 ENCOUNTER — Inpatient Hospital Stay

## 2019-09-17 ENCOUNTER — Ambulatory Visit: Payer: BC Managed Care – PPO | Admitting: Occupational Therapy

## 2019-09-17 ENCOUNTER — Encounter: Payer: Self-pay | Admitting: Oncology

## 2019-09-17 VITALS — BP 121/65 | HR 82 | Temp 98.3°F | Resp 18 | Wt 228.1 lb

## 2019-09-17 DIAGNOSIS — Z95828 Presence of other vascular implants and grafts: Secondary | ICD-10-CM

## 2019-09-17 DIAGNOSIS — C438 Malignant melanoma of overlapping sites of skin: Secondary | ICD-10-CM

## 2019-09-17 DIAGNOSIS — I890 Lymphedema, not elsewhere classified: Secondary | ICD-10-CM

## 2019-09-17 DIAGNOSIS — L80 Vitiligo: Secondary | ICD-10-CM

## 2019-09-17 DIAGNOSIS — Z5112 Encounter for antineoplastic immunotherapy: Secondary | ICD-10-CM

## 2019-09-17 DIAGNOSIS — D649 Anemia, unspecified: Secondary | ICD-10-CM | POA: Diagnosis not present

## 2019-09-17 LAB — CBC WITH DIFFERENTIAL/PLATELET
Abs Immature Granulocytes: 0.02 10*3/uL (ref 0.00–0.07)
Basophils Absolute: 0 10*3/uL (ref 0.0–0.1)
Basophils Relative: 1 %
Eosinophils Absolute: 0.3 10*3/uL (ref 0.0–0.5)
Eosinophils Relative: 7 %
HCT: 34.7 % — ABNORMAL LOW (ref 39.0–52.0)
Hemoglobin: 11.7 g/dL — ABNORMAL LOW (ref 13.0–17.0)
Immature Granulocytes: 1 %
Lymphocytes Relative: 20 %
Lymphs Abs: 0.8 10*3/uL (ref 0.7–4.0)
MCH: 29.9 pg (ref 26.0–34.0)
MCHC: 33.7 g/dL (ref 30.0–36.0)
MCV: 88.7 fL (ref 80.0–100.0)
Monocytes Absolute: 0.4 10*3/uL (ref 0.1–1.0)
Monocytes Relative: 10 %
Neutro Abs: 2.5 10*3/uL (ref 1.7–7.7)
Neutrophils Relative %: 61 %
Platelets: 213 10*3/uL (ref 150–400)
RBC: 3.91 MIL/uL — ABNORMAL LOW (ref 4.22–5.81)
RDW: 13.2 % (ref 11.5–15.5)
WBC: 4.1 10*3/uL (ref 4.0–10.5)
nRBC: 0 % (ref 0.0–0.2)

## 2019-09-17 LAB — COMPREHENSIVE METABOLIC PANEL
ALT: 34 U/L (ref 0–44)
AST: 28 U/L (ref 15–41)
Albumin: 4.2 g/dL (ref 3.5–5.0)
Alkaline Phosphatase: 84 U/L (ref 38–126)
Anion gap: 9 (ref 5–15)
BUN: 25 mg/dL — ABNORMAL HIGH (ref 6–20)
CO2: 25 mmol/L (ref 22–32)
Calcium: 9.1 mg/dL (ref 8.9–10.3)
Chloride: 106 mmol/L (ref 98–111)
Creatinine, Ser: 1.14 mg/dL (ref 0.61–1.24)
GFR calc Af Amer: >60 mL/min (ref >60)
GFR calc non Af Amer: >60 mL/min (ref >60)
Glucose, Bld: 124 mg/dL — ABNORMAL HIGH (ref 70–99)
Potassium: 4.3 mmol/L (ref 3.5–5.1)
Sodium: 140 mmol/L (ref 135–145)
Total Bilirubin: 0.7 mg/dL (ref 0.3–1.2)
Total Protein: 7.1 g/dL (ref 6.5–8.1)

## 2019-09-17 LAB — TSH: TSH: 3.955 u[IU]/mL (ref 0.350–4.500)

## 2019-09-17 MED ORDER — SODIUM CHLORIDE 0.9 % IV SOLN
240.0000 mg | Freq: Once | INTRAVENOUS | Status: AC
  Administered 2019-09-17: 240 mg via INTRAVENOUS
  Filled 2019-09-17: qty 24

## 2019-09-17 MED ORDER — HEPARIN SOD (PORK) LOCK FLUSH 100 UNIT/ML IV SOLN
500.0000 [IU] | Freq: Once | INTRAVENOUS | Status: AC | PRN
  Administered 2019-09-17: 500 [IU]
  Filled 2019-09-17: qty 5

## 2019-09-17 MED ORDER — HEPARIN SOD (PORK) LOCK FLUSH 100 UNIT/ML IV SOLN
INTRAVENOUS | Status: AC
  Filled 2019-09-17: qty 5

## 2019-09-17 MED ORDER — SODIUM CHLORIDE 0.9 % IV SOLN
Freq: Once | INTRAVENOUS | Status: AC
  Filled 2019-09-17: qty 250

## 2019-09-17 MED ORDER — SODIUM CHLORIDE 0.9% FLUSH
10.0000 mL | INTRAVENOUS | Status: DC | PRN
  Administered 2019-09-17: 10 mL via INTRAVENOUS
  Filled 2019-09-17: qty 10

## 2019-09-17 NOTE — Progress Notes (Signed)
Hematology/Oncology follow up  note [facility] [phone] [phone]   Patient Care Team: [name], [name], MD as PCP - General (Student)  REFERRING PROVIDER: Cherylann Parr, MD  CHIEF COMPLAINTS/REASON FOR VISIT:  Follow up for melanoma HISTORY OF PRESENTING ILLNESS:   Edward Pitts is a  59 y.o.  male with PMH listed below was seen in consultation at the request of  [name], [name], MD  for evaluation of inguinal mass Patient presented to emergency room 3 days ago complaining about left inguinal mass discomfort. Reports that he has really noticed the mass growing for the past 1 months. He has a history of left lower extremity melanoma in 2011, status post local excision.  Pain was increased with squatting of laxation. He was advised to take Tylenol for pain. Denies any fever, chills, night sweating.  He does feel mild nauseated. Appetite is fair.  He has lost about 10 pounds since earlier this year. 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  Left inguinal and left iliac nodes which are suspicious for involvement.  There are also 2 small nonspecific hypodense lesions within the right liver, nonspecific.  # patient underwent left groin mass resection On 04/16/2019. Resection pathology showed malignant melanoma, replacing a lymph node, with extracapsular extension, peripheral and deep margins involved.  Left inguinal contents, all 7 lymph nodes were negative for melanoma in the lymph nodes.  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  # PDL1 80% TPS  # 04/16/2019. underwent left groin mass resection   07/07/2019  Status post adjuvant radiation and finished radiation  Patient has Mediport placed to facilitate immunotherapy treatments. INTERVAL HISTORY Edward Pitts is a 59 y.o. male who has above history reviewed  by me today presents for follow up visit for management of inguinal nodal recurrence of melanoma Problems and complaints are listed below: Patient reports that his extremities have been pale recently.  He requests iron panel to be checked. He tolerates immunotherapy well.  Bilateral upper extremity skin hypopigmentation slightly worse.  Otherwise he has no new complaints . Review of Systems  Constitutional: Negative for appetite change, chills, fatigue, fever and unexpected weight change.  HENT:   Negative for hearing loss and voice change.   Eyes: Negative for eye problems and icterus.  Respiratory: Negative for chest tightness, cough and shortness of breath.   Cardiovascular: Negative for chest pain and leg swelling.  Gastrointestinal: Negative for abdominal distention and abdominal pain.  Endocrine: Negative for hot flashes.  Genitourinary: Negative for difficulty urinating, dysuria and frequency.   Musculoskeletal: Negative for arthralgias.  Skin: Negative for itching and rash.       Status post left groin mass resection. Skin hypo-pigmentation on upper extremities.  Neurological: Negative for light-headedness and numbness.  Hematological: Negative for adenopathy. Does not bruise/bleed easily.  Psychiatric/Behavioral: Negative for confusion.    MEDICAL HISTORY:  Past Medical History:  Diagnosis Date  . Anxiety   . Arthritis   . Complication of anesthesia   . Family history of adverse reaction to anesthesia    PONV mother  . Hyperlipidemia   . Hypertension   . Melanoma ([place]) 2012  . PONV (postoperative nausea and vomiting) 04/16/2019  . Presence of permanent cardiac pacemaker    [employer]  . Seizures ([place])     SURGICAL HISTORY: Past Surgical History:  Procedure Laterality Date  .  KNEE SURGERY Left   . LEFT HEART CATH AND CORONARY ANGIOGRAPHY Left 06/29/2017   Procedure: LEFT HEART CATH AND CORONARY ANGIOGRAPHY;  Surgeon: [name], MD;  Location: [facility] [place] [facility];  Service: Cardiovascular;  Laterality: Left;  . LYMPH NODE DISSECTION Left 04/16/2019   Procedure: Left inguinal Lymph Node Dissection;  Surgeon: [name], MD;  Location: [place];  Service: General;  Laterality: Left;  Marland Kitchen MELANOMA EXCISION Left 04/16/2019   Procedure: MELANOMA EXCISION LEFT GROIN MASS;  Surgeon: [name], MD;  Location: [place];  Service: General;  Laterality: Left;  Marland Kitchen MELANOMA EXCISION WITH SENTINEL LYMPH NODE BIOPSY Left 2012   Left calf   . PACEMAKER INSERTION N/A 08/26/2018   Procedure: INSERTION PACEMAKER;  Surgeon: [name], MD;  Location: [facility];  Service: Cardiovascular;  Laterality: N/A;  . PORTA CATH INSERTION N/A 08/26/2019   Procedure: PORTA CATH INSERTION;  Surgeon: Katha Cabal, MD;  Location: [place] [facility];  Service: Cardiovascular;  Laterality: N/A;  . TEMPORARY PACEMAKER N/A 08/25/2018   Procedure: TEMPORARY PACEMAKER;  Surgeon: [name], MD;  Location: [place] [facility];  Service: Cardiovascular;  Laterality: N/A;    SOCIAL HISTORY: Social History   Socioeconomic History  . Marital status: Married    Spouse name: Vicente Males   . Number of children: 7  . Years of education: 35  . Highest education level: Not on file  Occupational History  . Occupation: [employer]   Tobacco Use  . Smoking status: Never Smoker  . Smokeless tobacco: Never Used  Vaping Use  . Vaping Use: Never used  Substance and Sexual Activity  . Alcohol use: No  . Drug use: No  . Sexual activity: Not on file  Other Topics Concern  . Not on file  Social History Narrative   Lives with mother, wife and sister   Caffeine use: sodas (2 per day)   Social Determinants of Health   Financial Resource Strain: Low Risk   . Difficulty of Paying Living Expenses: Not hard at all  Food Insecurity: No Food Insecurity  . Worried About Charity fundraiser in the Last Year: Never true  . Ran Out of Food in the Last Year: Never true  Transportation Needs: Unmet  Transportation Needs  . Lack of Transportation (Medical): Yes  . Lack of Transportation (Non-Medical): Yes  Physical Activity: Unknown  . Days of Exercise per Week: 0 days  . Minutes of Exercise per Session: Not on file  Stress: No Stress Concern Present  . Feeling of Stress : Only a little  Social Connections: Unknown  . Frequency of Communication with Friends and Family: More than three times a week  . Frequency of Social Gatherings with Friends and Family: Not on file  . Attends Religious Services: Not on file  . Active Member of Clubs or Organizations: Not on file  . Attends Archivist Meetings: Not on file  . Marital Status: Married  Human resources officer Violence: Not At Risk  . Fear of Current or Ex-Partner: No  . Emotionally Abused: No  . Physically Abused: No  . Sexually Abused: No    FAMILY HISTORY: Family History  Problem Relation Age of Onset  . Cancer Paternal Grandmother     ALLERGIES:  is allergic to ibuprofen and nsaids.  MEDICATIONS:  Current Outpatient Medications  Medication Sig Dispense Refill  . acetaminophen (TYLENOL) 650 MG CR tablet Take 650 mg by mouth every 8 (eight) hours as needed  for pain.     Marland Kitchen allopurinol (ZYLOPRIM) 300 MG tablet Take 300 mg by mouth daily.    Marland Kitchen aspirin 81 MG chewable tablet Chew 81 mg by mouth daily.    Marland Kitchen atorvastatin (LIPITOR) 20 MG tablet Take 20 mg by mouth daily.    . clonazePAM (KLONOPIN) 0.5 MG tablet 1 tablet in the morning, 2 in the evening (Patient taking differently: Take 0.5 mg by mouth daily. Takes 2 tabs as needed.) 90 tablet 3  . hydrocortisone 2.5 % cream Apply topically daily as needed.    Marland Kitchen ketoconazole (NIZORAL) 2 % cream Apply 1 application topically daily as needed for irritation.    . lidocaine-prilocaine (EMLA) cream Apply 1 application topically as needed. Apply small amount of cream to port site approx 1-2 hours prior to appointment. 30 g 2  . lisinopril (ZESTRIL) 20 MG tablet Take 20 mg by mouth  daily.    . metoprolol succinate (TOPROL-XL) 25 MG 24 hr tablet Take 25 mg by mouth daily.     . Multiple Vitamin (MULTIVITAMIN WITH MINERALS) TABS tablet Take 1 tablet by mouth daily. Centrum Silver    . ondansetron (ZOFRAN) 8 MG tablet Take 1 tablet (8 mg total) by mouth every 8 (eight) hours as needed for nausea, vomiting or refractory nausea / vomiting. (Patient not taking: Reported on 08/21/2019) 30 tablet 0  . silver sulfADIAZINE (SILVADENE) 1 % cream Apply 1 application topically 2 (two) times daily. (Patient not taking: Reported on 08/06/2019) 50 g 2  . traMADol (ULTRAM) 50 MG tablet Take 1 tablet (50 mg total) by mouth every 6 (six) hours as needed. (Patient not taking: Reported on 08/19/2019) 30 tablet 0   No current facility-administered medications for this visit.     PHYSICAL EXAMINATION: ECOG PERFORMANCE STATUS: 0 - Asymptomatic Vitals:   09/17/19 0901  BP: 121/65  Pulse: 82  Resp: 18  Temp: 98.3 F (36.8 C)  SpO2: 97%   Filed Weights   09/17/19 0901  Weight: 228 lb 1.6 oz (103.5 kg)    Physical Exam Constitutional:      General: He is not in acute distress. HENT:     Head: Normocephalic and atraumatic.  Eyes:     General: No scleral icterus.    Pupils: Pupils are equal, round, and reactive to light.  Cardiovascular:     Rate and Rhythm: Normal rate and regular rhythm.     Heart sounds: Normal heart sounds.  Pulmonary:     Effort: Pulmonary effort is normal. No respiratory distress.     Breath sounds: No wheezing.  Abdominal:     General: Bowel sounds are normal. There is no distension.     Palpations: Abdomen is soft. There is no mass.     Tenderness: There is no abdominal tenderness.     Comments:  left groin mass resection and status post radiation.   Musculoskeletal:        General: No deformity. Normal range of motion.     Cervical back: Normal range of motion and neck supple.     Comments: Left lower extremity edema  Skin:    General: Skin is warm  and dry.     Findings: No erythema or rash.  Neurological:     Mental Status: He is alert and oriented to person, place, and time. Mental status is at baseline.     Cranial Nerves: No cranial nerve deficit.     Coordination: Coordination normal.  Psychiatric:  Mood and Affect: Mood normal.    LABORATORY DATA:  I have reviewed the data as listed Lab Results  Component Value Date   WBC 4.1 09/17/2019   HGB 11.7 (L) 09/17/2019   HCT 34.7 (L) 09/17/2019   MCV 88.7 09/17/2019   PLT 213 09/17/2019   Recent Labs    08/20/19 0829 09/03/19 0837 09/17/19 0839  NA 138 141 140  K 4.5 4.0 4.3  CL 105 107 106  CO2 '26 25 25  '$ GLUCOSE 107* 104* 124*  BUN 20 28* 25*  CREATININE 1.29* 1.24 1.14  CALCIUM 9.1 9.1 9.1  GFRNONAA >60 >60 >60  GFRAA >60 >60 >60  PROT 7.6 7.2 7.1  ALBUMIN 4.3 4.4 4.2  AST 25 33 28  ALT 33 34 34  ALKPHOS 99 97 84  BILITOT 0.6 0.8 0.7   Iron/TIBC/Ferritin/ %Sat    Component Value Date/Time   IRON 59 08/20/2019 0829   TIBC 336 08/20/2019 0829   FERRITIN 55 08/20/2019 0829   IRONPCTSAT 18 08/20/2019 0829      RADIOGRAPHIC STUDIES: I have personally reviewed the radiological images as listed and agreed with the findings in the report. CT Chest W Contrast  Result Date: 09/10/2019 CLINICAL DATA:  Staging of malignant melanoma, status post left groin mass resection in January. On immunotherapy. Loose stools. Skin melanoma resection in 2012. EXAM: CT CHEST, ABDOMEN, AND PELVIS WITH CONTRAST TECHNIQUE: Multidetector CT imaging of the chest, abdomen and pelvis was performed following the standard protocol during bolus administration of intravenous contrast. CONTRAST:  187m OMNIPAQUE IOHEXOL 300 MG/ML  SOLN COMPARISON:  01/31/2019 CTA of the abdomen and pelvis. PET of 02/11/2019. No prior dedicated chest CTs. FINDINGS: CT CHEST FINDINGS Cardiovascular: Pacer. Aortic atherosclerosis. Normal heart size, without pericardial effusion. No central pulmonary  embolism, on this non-dedicated study. Lad coronary artery calcification. Mediastinum/Nodes: No supraclavicular adenopathy. 8 mm right-sided thyroid nodule. No supraclavicular adenopathy. No mediastinal or hilar adenopathy. Lungs/Pleura: No pleural fluid. No suspicious pulmonary nodule or mass. Musculoskeletal: No acute osseous abnormality. CT ABDOMEN PELVIS FINDINGS Hepatobiliary: Subcentimeter right hepatic lobe cyst. There is also a too small to characterize, 3-4 mm more inferior right hepatic lobe lesion on 60/2. Likely present on the prior PET. Normal gallbladder, without biliary ductal dilatation. Pancreas: Normal, without mass or ductal dilatation. Spleen: Normal in size, without focal abnormality. Adrenals/Urinary Tract: Normal adrenal glands. Lower pole right renal sinus cyst or minimally complex cyst of 1.9 cm. Upper pole right renal subcentimeter lesion is too small to characterize. No hydronephrosis. Normal urinary bladder. Stomach/Bowel: Normal stomach, without wall thickening. Normal colon and terminal ileum. Normal small bowel. Vascular/Lymphatic: Advanced aortic and branch vessel atherosclerosis. The previously described dominant left inguinal mass has been resected, with subcutaneous and intramuscular edema identified. The left external iliac nodes measure maximally 5 mm today on 107/2 versus 8 mm on the prior PET (when remeasured). No pelvic sidewall adenopathy. Reproductive: Normal prostate. Other: No significant free fluid. Moderate size fat containing periumbilical ventral wall hernia. Musculoskeletal: Degenerative partial fusion of the right sacroiliac joint. IMPRESSION: 1. Interval resection of previously described left inguinal mass. The indeterminate left external iliac nodes on prior PET are decreased in size. 2. No evidence of new or progressive disease. 3. Coronary artery atherosclerosis. Aortic Atherosclerosis (ICD10-I70.0). 4. A too small to characterize right hepatic lobe low-density  lesion is felt to be similar to on the prior PET, not hypermetabolic on that exam. Felt unlikely to represent metastasis but technically indeterminate. Recommend attention on  follow-up. 5. 8 mm right thyroid nodule. Not clinically significant; no follow-up imaging recommended (ref: J Am Coll Radiol. 2015 Feb;12(2): 143-50). Electronically Signed   By: Abigail Miyamoto M.D.   On: 09/10/2019 11:08   CT Abdomen Pelvis W Contrast  Result Date: 09/10/2019 CLINICAL DATA:  Staging of malignant melanoma, status post left groin mass resection in January. On immunotherapy. Loose stools. Skin melanoma resection in 2012. EXAM: CT CHEST, ABDOMEN, AND PELVIS WITH CONTRAST TECHNIQUE: Multidetector CT imaging of the chest, abdomen and pelvis was performed following the standard protocol during bolus administration of intravenous contrast. CONTRAST:  126m OMNIPAQUE IOHEXOL 300 MG/ML  SOLN COMPARISON:  01/31/2019 CTA of the abdomen and pelvis. PET of 02/11/2019. No prior dedicated chest CTs. FINDINGS: CT CHEST FINDINGS Cardiovascular: Pacer. Aortic atherosclerosis. Normal heart size, without pericardial effusion. No central pulmonary embolism, on this non-dedicated study. Lad coronary artery calcification. Mediastinum/Nodes: No supraclavicular adenopathy. 8 mm right-sided thyroid nodule. No supraclavicular adenopathy. No mediastinal or hilar adenopathy. Lungs/Pleura: No pleural fluid. No suspicious pulmonary nodule or mass. Musculoskeletal: No acute osseous abnormality. CT ABDOMEN PELVIS FINDINGS Hepatobiliary: Subcentimeter right hepatic lobe cyst. There is also a too small to characterize, 3-4 mm more inferior right hepatic lobe lesion on 60/2. Likely present on the prior PET. Normal gallbladder, without biliary ductal dilatation. Pancreas: Normal, without mass or ductal dilatation. Spleen: Normal in size, without focal abnormality. Adrenals/Urinary Tract: Normal adrenal glands. Lower pole right renal sinus cyst or minimally  complex cyst of 1.9 cm. Upper pole right renal subcentimeter lesion is too small to characterize. No hydronephrosis. Normal urinary bladder. Stomach/Bowel: Normal stomach, without wall thickening. Normal colon and terminal ileum. Normal small bowel. Vascular/Lymphatic: Advanced aortic and branch vessel atherosclerosis. The previously described dominant left inguinal mass has been resected, with subcutaneous and intramuscular edema identified. The left external iliac nodes measure maximally 5 mm today on 107/2 versus 8 mm on the prior PET (when remeasured). No pelvic sidewall adenopathy. Reproductive: Normal prostate. Other: No significant free fluid. Moderate size fat containing periumbilical ventral wall hernia. Musculoskeletal: Degenerative partial fusion of the right sacroiliac joint. IMPRESSION: 1. Interval resection of previously described left inguinal mass. The indeterminate left external iliac nodes on prior PET are decreased in size. 2. No evidence of new or progressive disease. 3. Coronary artery atherosclerosis. Aortic Atherosclerosis (ICD10-I70.0). 4. A too small to characterize right hepatic lobe low-density lesion is felt to be similar to on the prior PET, not hypermetabolic on that exam. Felt unlikely to represent metastasis but technically indeterminate. Recommend attention on follow-up. 5. 8 mm right thyroid nodule. Not clinically significant; no follow-up imaging recommended (ref: J Am Coll Radiol. 2015 Feb;12(2): 143-50). Electronically Signed   By: KAbigail MiyamotoM.D.   On: 09/10/2019 11:08   PERIPHERAL VASCULAR CATHETERIZATION  Result Date: 08/26/2019 See op note      ASSESSMENT & PLAN:  1. Malignant [place] of [place] sites [place]   2. Anemia, unspecified type   3. Vitiligo   4. Encounter for antineoplastic immunotherapy    #Left inguinal nodal recurrence of melanoma.  Status post resection and adjuvant radiation. CT scan was independently reviewed by me and discussed with  patient. No detectable new or progressive disease.  Small liver lesion remains stable.  Previously not PET positive.  #Vitiligo, he seems to have developed more area of skin hypopigmentation.  He understands that this is side effects from immunotherapy and he is okay with the cosmetic changes.  #Normocytic anemia, stable hemoglobin.  Check iron  panel. #Port-A-Cath in place.  Patient is getting treatments every 2 weeks.  No need for additional flush at this point.  All questions were answered. The patient knows to call the clinic with any problems questions or concerns.  Follow-up with 2 weeks. We spent sufficient time to discuss many aspect of care, questions were answered to patient's satisfaction.   [name], MD, PhD Hematology Oncology [facility] at [facility] Pager- [phone] 09/17/2019

## 2019-09-17 NOTE — Progress Notes (Signed)
Patient here for oncology follow-up appointment, expresses concerns of knee pain  

## 2019-09-17 NOTE — Therapy (Signed)
[place] [facility] MAIN [facility] SERVICES [address] [place], [place], [address] Phone: [phone]   Fax:  [phone]  Occupational Therapy Treatment  Patient Details  Name: Edward Pitts MRN: [id number] Date of Birth: [birth year] Referring Provider (OT): Rickard Patience, MD   Encounter Date: 09/17/2019   OT End of Session - 09/17/19 1625    Visit Number 19    Number of Visits 36    Date for OT Re-Evaluation 10/22/19    OT Start Time 0315    OT Stop Time 0419    OT Time Calculation (min) 64 min    Equipment Utilized During Treatment Tactile Medical Flexitouch sequential pneumatic compression device , ("pump")    Activity Tolerance Patient tolerated treatment well;No increased pain    Behavior During Therapy WFL for tasks assessed/performed           Past Medical History:  Diagnosis Date  . Anxiety   . Arthritis   . Complication of anesthesia   . Family history of adverse reaction to anesthesia    PONV mother  . Hyperlipidemia   . Hypertension   . Melanoma (HCC) 2012  . PONV (postoperative nausea and vomiting) 04/16/2019  . Presence of permanent cardiac pacemaker    Medtronic  . Seizures (HCC)     Past Surgical History:  Procedure Laterality Date  . KNEE SURGERY Left   . LEFT HEART CATH AND CORONARY ANGIOGRAPHY Left 06/29/2017   Procedure: LEFT HEART CATH AND CORONARY ANGIOGRAPHY;  Surgeon: Lamar Blinks, MD;  Location: [facility] INVASIVE CV LAB;  Service: Cardiovascular;  Laterality: Left;  . LYMPH NODE DISSECTION Left 04/16/2019   Procedure: Left inguinal Lymph Node Dissection;  Surgeon: Almond Lint, MD;  Location: [facility] OR;  Service: General;  Laterality: Left;  Marland Kitchen MELANOMA EXCISION Left 04/16/2019   Procedure: MELANOMA EXCISION LEFT GROIN MASS;  Surgeon: Almond Lint, MD;  Location: [facility] OR;  Service: General;  Laterality: Left;  Marland Kitchen MELANOMA EXCISION WITH SENTINEL LYMPH NODE BIOPSY Left 2012   Left calf   . PACEMAKER INSERTION N/A 08/26/2018    Procedure: INSERTION PACEMAKER;  Surgeon: Marcina Millard, MD;  Location: [facility] ORS;  Service: Cardiovascular;  Laterality: N/A;  . PORTA CATH INSERTION N/A 08/26/2019   Procedure: PORTA CATH INSERTION;  Surgeon: Renford Dills, MD;  Location: [facility] INVASIVE CV LAB;  Service: Cardiovascular;  Laterality: N/A;  . TEMPORARY PACEMAKER N/A 08/25/2018   Procedure: TEMPORARY PACEMAKER;  Surgeon: Tonny Bollman, MD;  Location: [facility] INVASIVE CV LAB;  Service: Cardiovascular;  Laterality: N/A;    There were no vitals filed for this visit.   Subjective Assessment - 09/17/19 1521    Subjective  Randeep Biondolillo presents to OT for visit 19/36 to address LLE lymphedema 2/2 melanoma Rx. Pt presents with full length LLE compression wraps in place. Pt reports 7/10 L knee pain this afternoon. Pt reports he insurance   benefits will cover the Flexitouch and it is scheduled to be delivered tomorrow.    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 w/ LN disection (-7/7 LN); Completed adjuvant XRT 07/07/2019; 08/2018 Pacemaker placed; L knee sx date?;    Limitations chronic LLE knee pain; post surgical LLE pain,  chronic LLE/LLQ swelling, decreased LLE strength, impaired gait, decreased dynamic balance, altered sensation LLE, increased risk of infection, decreased standing and walking tolerance > 1 hr; decreased ankle AROM    Repetition Increases Symptoms    Special Tests +Stemmer  base L toes    Patient Stated Goals reduce swelling to normal and keep it from getting worse    Pain Score 7     Pain Location Knee    Pain Orientation Right;Left    Pain Type Chronic pain    Pain Onset More than a month ago    Pain Frequency Intermittent   weight bearing                       OT Treatments/Exercises (OP) - 09/17/19 0001      ADLs   ADL Education Given Yes (P)       Manual Therapy   Manual Therapy Edema management (P)     Manual Lymphatic  Drainage (MLD) MLDto LLE/LLQ in supine and sidelying utilizing short neck sequence, deep abdominals with diaphragmatic breathing, ipsilateral inguinal -axiillary anastomosis and proximal to distal leg sequence.  (P)     Compression Bandaging thigh length multi layer gradient compression wraps to LLE- short stretch (P)                   OT Education - 09/17/19 1625    Education Details Continued skilled Pt/caregiver education  And LE ADL training throughout visit for lymphedema self care/ home program, including compression wrapping, compression garment and device wear/care, lymphatic pumping ther ex, simple self-MLD, and skin care. Discussed progress towards goals.    Person(s) Educated Patient;Spouse    Methods Explanation;Demonstration;Handout    Comprehension Verbalized understanding;Returned demonstration;Need further instruction               OT Long Term Goals - 08/14/19 1627      OT LONG TERM GOAL #1   Title Pt will be able to apply thigh length, multi-layer, short stretch compression wraps daily to single limb using correct gradient techniques with modified independence (extra time) to achieve optimal limb volume reduction, to return affected limb/s, as closely as possible, to premorbid size and shape, to limit infection risk, and to improve safe functional ambulation and mobility.   Pt able to apply gradient wraps with max CG assistance   Baseline Max A    Time 4    Period Days    Status Achieved      OT LONG TERM GOAL #2   Title Pt will be able to verbalize signs and symptoms of cellulitis infection and identify at least 4 common lymphedema precautions using printed resource for reference to limit LE progression over time to limit risk of infection and LE exacerbation.    Baseline Max A    Time 4    Period Days    Status Partially Met      OT LONG TERM GOAL #3   Title Pt will tolerate thigh length, multilayer short stretch compression wrap to single leg up to 23/  7 to achieve optimal swelling reduction to arrest lymphedema progression and in preparation for fitting appropriate compression garments/ devices.    Baseline Max A    Time 12    Period Weeks    Status Partially Met   Pt achieved ~50% compliance with compression wraps thus far.     OT LONG TERM GOAL #4   Title Pt will achieve 10% or greater limb volume reduction to LLE from toes to groin to enable functional improvements including fitting into preferred footwear and clothing, decreased infection risk and improved body image.    Baseline Max A    Time 12    Period  Weeks    Status Partially Met   11% LLE limb volume reduction below the knee achieved 08/14/19. Overall limb volume reduction to date = 3%     OT LONG TERM GOAL #5   Title Pt will achieve and sustain at least 85%  compliance with all daily LE self-care home program components, to include skin care, lymphatic pumping therex, compression wraps and garments/devices, and simple self-MLD, to reduce and control limb swelling, reduce infection risk and limit LE progression.    Baseline Max A    Time 12    Period Weeks    Status Partially Met      OT LONG TERM GOAL #6   Title Pt will achieve modified independence ( extra time, assistive device) with donning and doffing day and night time compression garments and devices to limit re-accumulation of lymphatic congestion and progression of lymphedema.    Baseline Max A    Time 12    Period Weeks    Status On-going                 Plan - 09/17/19 1627    Clinical Impression Statement Continued MLE and fibrosis techniques to LLE . Pt tolerated well without pain. Mr [place] demonstrates improved compliance with compression wraps between visits over the past few weeks.Dense, non-pitting tissue fibrosis continues to accompany very stubborn swelling in leg below knee to toes. Cont as per POC.    OT Occupational Profile and History Problem Focused Assessment - Including review of records  relating to presenting problem    Occupational performance deficits (Please refer to evaluation for details): ADL's;IADL's;Rest and Sleep;Work;Leisure;Social Participation;Other   body image, role performance   Body Structure / Function / Physical Skills ADL;ROM;Scar mobility;IADL;Edema;Balance;Sensation;Skin integrity;Mobility;Flexibility;Strength;Decreased knowledge of precautions;Gait;Pain;Decreased knowledge of use of DME    Rehab Potential Good    Clinical Decision Making Several treatment options, min-mod task modification necessary    Comorbidities Affecting Occupational Performance: Presence of comorbidities impacting occupational performance    Modification or Assistance to Complete Evaluation  Min-Moderate modification of tasks or assist with assess necessary to complete eval    OT Frequency 2x / week    OT Duration 12 weeks   and PRN   OT Treatment/Interventions Self-care/ADL training;Therapeutic exercise;Functional Mobility Training;Manual Therapy;Coping strategies training;Therapeutic activities;Manual lymph drainage;Energy conservation;DME and/or AE instruction;Compression bandaging;Other (comment);Scar mobilization;Patient/family education   fit with appropriate compression garments once swelling is reduced   Plan Complete Decongestive Therapy (CDT), Intensive and Management Phases to include  manual lymphatyic drainage (MLD), skin care, therapeutic exercise, compression with short stretch wraps and garments, Pt edu for LE sef care    Consulted and Agree with Plan of Care Patient           Patient will benefit from skilled therapeutic intervention in order to improve the following deficits and impairments:   Body Structure / Function / Physical Skills: ADL, ROM, Scar mobility, IADL, Edema, Balance, Sensation, Skin integrity, Mobility, Flexibility, Strength, Decreased knowledge of precautions, Gait, Pain, Decreased knowledge of use of DME       Visit Diagnosis: Lymphedema, not  elsewhere classified    Problem List Patient Active Problem List   Diagnosis Date Noted  . Vitiligo 09/03/2019  . Port-A-Cath in place 08/20/2019  . Encounter for antineoplastic immunotherapy 08/06/2019  . Malignant melanoma of overlapping sites ([place]) 04/23/2019  . Goals of care, counseling/discussion 04/23/2019  . 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 Near syncope 12/17/2018  . Anemia in  chronic kidney disease 12/11/2018  . Benign hypertensive kidney disease with chronic kidney disease 12/11/2018  . Stage 3a chronic kidney disease 12/11/2018  . AKI (acute kidney injury) ([place]) 08/24/2018  . Acute hyperkalemia 08/24/2018  . HTN (hypertension) 08/24/2018  . HLD (hyperlipidemia) 08/24/2018  . Seizures ([place]) 08/24/2018  . Complete heart block ([place]) 08/24/2018  . Arthritis of wrist, left 06/12/2018  . Bradycardia 06/04/2018  . 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 Positive ANA (antinuclear antibody) 03/05/2018  . Swelling of joint of left wrist 03/05/2018  . Coronary artery disease involving native coronary artery of native heart 07/09/2017  . Palpitations 07/09/2017  . Stable angina ([place]) 06/27/2017  . Benign essential HTN 05/25/2017  . LBBB (left bundle branch block) 05/25/2017    Andrey Spearman, MS, OTR/L, [facility] 09/17/19 4:34 PM  [place] MAIN [facility] SERVICES [address] [place], [place], [address] Phone: [phone]   Fax:  [phone]  Name: Zakry Caso MRN: [id number] Date of Birth: 03-23-1961

## 2019-09-18 ENCOUNTER — Ambulatory Visit: Payer: BC Managed Care – PPO | Admitting: Occupational Therapy

## 2019-09-18 DIAGNOSIS — I890 Lymphedema, not elsewhere classified: Secondary | ICD-10-CM | POA: Diagnosis not present

## 2019-09-18 NOTE — Therapy (Signed)
[place] MAIN [facility] SERVICES [address] [place], [place], [address] Phone: [phone]   Fax:  [phone]  Occupational Therapy Treatment  Patient Details  Name: Edward Pitts MRN: [id number] Date of Birth: 1960-11-17 Referring Provider (OT): [name], MD   Encounter Date: 09/15/2019    Past Medical History:  Diagnosis Date  . Anxiety   . Arthritis   . Complication of anesthesia   . Family history of adverse reaction to anesthesia    PONV mother  . Hyperlipidemia   . Hypertension   . Melanoma ([place]) 2012  . PONV (postoperative nausea and vomiting) 04/16/2019  . Presence of permanent cardiac pacemaker    Medtronic  . Seizures ([place])     Past Surgical History:  Procedure Laterality Date  . KNEE SURGERY Left   . LEFT HEART CATH AND CORONARY ANGIOGRAPHY Left 06/29/2017   Procedure: LEFT HEART CATH AND CORONARY ANGIOGRAPHY;  Surgeon: [name], MD;  Location: [place] CV LAB;  Service: Cardiovascular;  Laterality: Left;  . LYMPH NODE DISSECTION Left 04/16/2019   Procedure: Left inguinal Lymph Node Dissection;  Surgeon: [name], MD;  Location: [place];  Service: General;  Laterality: Left;  Marland Kitchen MELANOMA EXCISION Left 04/16/2019   Procedure: MELANOMA EXCISION LEFT GROIN MASS;  Surgeon: [name], MD;  Location: [place];  Service: General;  Laterality: Left;  Marland Kitchen MELANOMA EXCISION WITH SENTINEL LYMPH NODE BIOPSY Left 2012   Left calf   . PACEMAKER INSERTION N/A 08/26/2018   Procedure: INSERTION PACEMAKER;  Surgeon: [name], MD;  Location: [facility] ORS;  Service: Cardiovascular;  Laterality: N/A;  . PORTA CATH INSERTION N/A 08/26/2019   Procedure: PORTA CATH INSERTION;  Surgeon: Katha Cabal, MD;  Location: [place] CV LAB;  Service: Cardiovascular;  Laterality: N/A;  . TEMPORARY PACEMAKER N/A 08/25/2018   Procedure: TEMPORARY PACEMAKER;  Surgeon: [name], MD;  Location: [place] CV LAB;  Service:  Cardiovascular;  Laterality: N/A;    There were no vitals filed for this visit.                              OT Long Term Goals - 09/18/19 1303      OT LONG TERM GOAL #1   Title Pt will be able to apply thigh length, multi-layer, short stretch compression wraps daily to single limb using correct gradient techniques with Maximum assistance to achieve optimal limb volume reduction, to return affected limb/s, as closely as possible, to premorbid size and shape, to limit infection risk, and to improve safe functional ambulation and mobility.    Status Revised   Pt unable to bend left knee to extent needed to appy compression garments independently. [place] is independent with gradient techniques.     OT LONG TERM GOAL #2   Title Pt will be able to verbalize signs and symptoms of cellulitis infection and identify at least 4 common lymphedema precautions using printed resource for reference to limit LE progression over time to limit risk of infection and LE exacerbation.    Status Achieved      OT LONG TERM GOAL #3   Status Achieved      OT LONG TERM GOAL #4   Status Achieved   achieved this date.. Goal met for LLE     OT LONG TERM GOAL #5   Status Achieved      OT LONG TERM GOAL #6   Status  On-going                  Patient will benefit from skilled therapeutic intervention in order to improve the following deficits and impairments:   Body Structure / Function / Physical Skills: ADL, ROM, Scar mobility, IADL, Edema, Balance, Sensation, Skin integrity, Mobility, Flexibility, Strength, Decreased knowledge of precautions, Gait, Pain, Decreased knowledge of use of DME       Visit Diagnosis: Lymphedema, not elsewhere classified    Problem List Patient Active Problem List   Diagnosis Date Noted  . Vitiligo 09/03/2019  . Port-A-Cath in place 08/20/2019  . Encounter for antineoplastic immunotherapy 08/06/2019  . Malignant melanoma of overlapping  sites ([place]) 04/23/2019  . Goals of care, counseling/discussion 04/23/2019  . 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 Near syncope 12/17/2018  . Anemia in chronic kidney disease 12/11/2018  . Benign hypertensive kidney disease with chronic kidney disease 12/11/2018  . Stage 3a chronic kidney disease 12/11/2018  . AKI (acute kidney injury) ([place]) 08/24/2018  . Acute hyperkalemia 08/24/2018  . HTN (hypertension) 08/24/2018  . HLD (hyperlipidemia) 08/24/2018  . Seizures ([employer]) 08/24/2018  . Complete heart block ([place]) 08/24/2018  . Arthritis of wrist, left 06/12/2018  . Bradycardia 06/04/2018  . 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 Positive ANA (antinuclear antibody) 03/05/2018  . Swelling of joint of left wrist 03/05/2018  . Coronary artery disease involving native coronary artery of native heart 07/09/2017  . Palpitations 07/09/2017  . Stable angina ([place]) 06/27/2017  . Benign essential HTN 05/25/2017  . LBBB (left bundle branch block) 05/25/2017    Andrey Spearman, MS, OTR/L, [facility] 09/18/19 1:58 PM   [place] MAIN [facility] SERVICES [address] [place], [place], [address] Phone: [phone]   Fax:  [phone]  Name: Emmet Messer MRN: [id number] Date of Birth: [birth year]

## 2019-09-18 NOTE — Therapy (Signed)
[place] MAIN [facility] SERVICES [address] [place], [place], [address] Phone: [phone]   Fax:  [phone]  Occupational Therapy Treatment  Patient Details  Name: Edward Pitts MRN: [id number] Date of Birth: 1960-09-09 Referring Provider (OT): Edward Server, MD   Encounter Date: 09/18/2019   OT End of Session - 09/18/19 1252    Visit Number 20    Number of Visits 36    Date for OT Re-Evaluation 10/22/19    OT Start Time 0906    OT Stop Time 1006    OT Time Calculation (min) 60 min    Equipment Utilized During Treatment Tactile Medical Flexitouch sequential pneumatic compression device , ("pump")    Activity Tolerance Patient tolerated treatment well;No increased pain    Behavior During Therapy WFL for tasks assessed/performed           Past Medical History:  Diagnosis Date  . Anxiety   . Arthritis   . Complication of anesthesia   . Family history of adverse reaction to anesthesia    PONV mother  . Hyperlipidemia   . Hypertension   . Melanoma ([place]) 2012  . PONV (postoperative nausea and vomiting) 04/16/2019  . Presence of permanent cardiac pacemaker    Medtronic  . Seizures ([place])     Past Surgical History:  Procedure Laterality Date  . KNEE SURGERY Left   . LEFT HEART CATH AND CORONARY ANGIOGRAPHY Left 06/29/2017   Procedure: LEFT HEART CATH AND CORONARY ANGIOGRAPHY;  Surgeon: [name], MD;  Location: [place] [facility];  Service: Cardiovascular;  Laterality: Left;  . LYMPH NODE DISSECTION Left 04/16/2019   Procedure: Left inguinal Lymph Node Dissection;  Surgeon: [name], MD;  Location: [place];  Service: General;  Laterality: Left;  Marland Kitchen MELANOMA EXCISION Left 04/16/2019   Procedure: MELANOMA EXCISION LEFT GROIN MASS;  Surgeon: [name], MD;  Location: [place];  Service: General;  Laterality: Left;  Marland Kitchen MELANOMA EXCISION WITH SENTINEL LYMPH NODE BIOPSY Left 2012   Left calf   . PACEMAKER INSERTION N/A 08/26/2018    Procedure: INSERTION PACEMAKER;  Surgeon: [name], MD;  Location: [facility];  Service: Cardiovascular;  Laterality: N/A;  . PORTA CATH INSERTION N/A 08/26/2019   Procedure: PORTA CATH INSERTION;  Surgeon: Katha Cabal, MD;  Location: [place] [facility];  Service: Cardiovascular;  Laterality: N/A;  . TEMPORARY PACEMAKER N/A 08/25/2018   Procedure: TEMPORARY PACEMAKER;  Surgeon: [name], MD;  Location: [place] [facility];  Service: Cardiovascular;  Laterality: N/A;    There were no vitals filed for this visit.   Subjective Assessment - 09/18/19 0912    Subjective  Edward Pitts presents to OT for visit 20/36 to address LLE lymphedema 2/2 melanoma Rx. Pt presents with full length LLE compression wraps in place. Pt reports 7/10 L knee pain is unchanged this morning.    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 w/ LN disection (-7/7 LN); Completed adjuvant XRT 07/07/2019; 08/2018 Pacemaker placed; L knee sx date?;    Limitations chronic LLE knee pain; post surgical LLE pain,  chronic LLE/LLQ swelling, decreased LLE strength, impaired gait, decreased dynamic balance, altered sensation LLE, increased risk of infection, decreased standing and walking tolerance > 1 hr; decreased ankle AROM    Repetition Increases Symptoms    Special Tests +Stemmer base L toes    Patient Stated Goals reduce swelling to normal and keep it from  getting worse    Pain Onset More than a month ago               LYMPHEDEMA/ONCOLOGY QUESTIONNAIRE - 09/18/19 0001      Left Lower Extremity Lymphedema   Other LLE A-D volume =3762.5 ml. RLE E-G volume = 6535.6 ml. LLE A-G volume = 10298 ml.    Other LLE leg volume (A-D) is reduced by 1.38% since last measured on 09/04/19, and by 16.28% overall since initially measured on 07/28/19. % since last measured on 08/14/19, and by 15.11% since commencing CDT on 07/28/19. LLE thigh volume is decreased by  1.9% since last measured on 6/10 and by 5.98% overall. , LLE full limb volume (A-G) is decreased by 1.7% since last measured on 09/04/19 and by 10.22% overal since commencing OT for CDT. Goal met !                   OT Treatments/Exercises (OP) - 09/18/19 0001      ADLs   ADL Education Given Yes      Manual Therapy   Manual Therapy Edema management    Manual therapy comments LLE comparative limb volumetrics    Edema Management fibrosis techniques and MR to L ankle , distal leg and foot    Manual Lymphatic Drainage (MLD) MLDto LLE/LLQ in supine and sidelying utilizing short neck sequence, deep abdominals with diaphragmatic breathing, ipsilateral inguinal -axiillary anastomosis and proximal to distal leg sequence.     Compression Bandaging thigh length multi layer gradient compression wraps to LLE- short stretch                  OT Education - 09/18/19 1251    Education Details Continued skilled Pt/caregiver education  And LE ADL training throughout visit for lymphedema self care/ home program, including compression wrapping, compression garment and device wear/care, lymphatic pumping ther ex, simple self-MLD, and skin care. Discussed progress towards goals.    Person(s) Educated Patient;Spouse    Methods Explanation;Demonstration;Handout    Comprehension Verbalized understanding;Returned demonstration;Need further instruction               OT Long Term Goals - 09/18/19 1303      OT LONG TERM GOAL #1   Title Pt will be able to apply thigh length, multi-layer, short stretch compression wraps daily to single limb using correct gradient techniques with Maximum assistance to achieve optimal limb volume reduction, to return affected limb/s, as closely as possible, to premorbid size and shape, to limit infection risk, and to improve safe functional ambulation and mobility.    Status Revised   Pt unable to bend left knee to extent needed to appy compression garments  independently. Cargiver is independent with gradient techniques.     OT LONG TERM GOAL #2   Title Pt will be able to verbalize signs and symptoms of cellulitis infection and identify at least 4 common lymphedema precautions using printed resource for reference to limit LE progression over time to limit risk of infection and LE exacerbation.    Status Achieved      OT LONG TERM GOAL #3   Status Achieved      OT LONG TERM GOAL #4   Status Achieved   achieved this date.. Goal met for LLE     OT LONG TERM GOAL #5   Status Achieved      OT LONG TERM GOAL #6   Status On-going  Plan - 09/18/19 1259    Clinical Impression Statement Completed LLE comparative limb volumetrics to measure progress toward limb volume reduction goal. LLE leg volume (A-D) is reduced by 1.38% since last measured on 09/04/19, and by 16.28% overall since initially measured on 07/28/19. % since last measured on 08/14/19, and by 15.11% since commencing CDT on 07/28/19. LLE thigh volume is decreased by 1.9% since last measured on 6/10 and by 5.98% overall. , LLE full limb volume (A-G) is decreased by 1.7% since last measured on 09/04/19 and by 10.22% overal since commencing OT for CDT. Goal met ! Pt continues to make progress towards all OT goals for LE care. Refer to LONG TERM GOALS section for details. Mr Ybarra tolerated MLD and compression wrapp as established without difficulty. We discussed options    for custom compression garment configuration and decided to start with 1 1/2 leg garment in an effort to limit silicone band contact with surgical scar at L medial leg. We'll measure next visit.    OT Occupational Profile and History Problem Focused Assessment - Including review of records relating to presenting problem    Occupational performance deficits (Please refer to evaluation for details): ADL's;IADL's;Rest and Sleep;Work;Leisure;Social Participation;Other   body image, role performance   Body Structure /  Function / Physical Skills ADL;ROM;Scar mobility;IADL;Edema;Balance;Sensation;Skin integrity;Mobility;Flexibility;Strength;Decreased knowledge of precautions;Gait;Pain;Decreased knowledge of use of DME    Rehab Potential Good    Clinical Decision Making Several treatment options, min-mod task modification necessary    Comorbidities Affecting Occupational Performance: Presence of comorbidities impacting occupational performance    Modification or Assistance to Complete Evaluation  Min-Moderate modification of tasks or assist with assess necessary to complete eval    OT Frequency 2x / week    OT Duration 12 weeks   and PRN   OT Treatment/Interventions Self-care/ADL training;Therapeutic exercise;Functional Mobility Training;Manual Therapy;Coping strategies training;Therapeutic activities;Manual lymph drainage;Energy conservation;DME and/or AE instruction;Compression bandaging;Other (comment);Scar mobilization;Patient/family education   fit with appropriate compression garments once swelling is reduced   Plan Complete Decongestive Therapy (CDT), Intensive and Management Phases to include  manual lymphatyic drainage (MLD), skin care, therapeutic exercise, compression with short stretch wraps and garments, Pt edu for LE sef care    Consulted and Agree with Plan of Care Patient           Patient will benefit from skilled therapeutic intervention in order to improve the following deficits and impairments:   Body Structure / Function / Physical Skills: ADL, ROM, Scar mobility, IADL, Edema, Balance, Sensation, Skin integrity, Mobility, Flexibility, Strength, Decreased knowledge of precautions, Gait, Pain, Decreased knowledge of use of DME       Visit Diagnosis: Lymphedema, not elsewhere classified    Problem List Patient Active Problem List   Diagnosis Date Noted  . Vitiligo 09/03/2019  . Port-A-Cath in place 08/20/2019  . Encounter for antineoplastic immunotherapy 08/06/2019  . Malignant  melanoma of overlapping sites ([place]) 04/23/2019  . Goals of care, counseling/discussion 04/23/2019  . 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 Near syncope 12/17/2018  . Anemia in chronic kidney disease 12/11/2018  . Benign hypertensive kidney disease with chronic kidney disease 12/11/2018  . Stage 3a chronic kidney disease 12/11/2018  . AKI (acute kidney injury) ([place]) 08/24/2018  . Acute hyperkalemia 08/24/2018  . HTN (hypertension) 08/24/2018  . HLD (hyperlipidemia) 08/24/2018  . Seizures ([place]) 08/24/2018  . Complete heart block ([place]) 08/24/2018  . Arthritis of wrist, left 06/12/2018  . Bradycardia 06/04/2018  . Chronic gouty  arthropathy without tophi 03/13/2018  . Positive ANA (antinuclear antibody) 03/05/2018  . Swelling of joint of left wrist 03/05/2018  . Coronary artery disease involving native coronary artery of native heart 07/09/2017  . Palpitations 07/09/2017  . Stable angina ([place]) 06/27/2017  . Benign essential HTN 05/25/2017  . LBBB (left bundle branch block) 05/25/2017   Andrey Spearman, MS, OTR/L, [facility] 09/18/19 1:26 PM   [place] MAIN [facility] SERVICES [address] [place], [place], [address] Phone: [phone]   Fax:  [phone]  Name: Edward Pitts MRN: [id number] Date of Birth: 18-Jun-1960

## 2019-09-19 NOTE — Progress Notes (Signed)
PSN spoke with patient today.  Patient does not have any income at this time.  He is waiting for approval from [employer].  Patient will receive assistance from the charitable funds for some of his monthly expenses.

## 2019-09-22 ENCOUNTER — Ambulatory Visit: Payer: BC Managed Care – PPO | Admitting: Occupational Therapy

## 2019-09-22 ENCOUNTER — Other Ambulatory Visit: Payer: Self-pay

## 2019-09-22 DIAGNOSIS — I890 Lymphedema, not elsewhere classified: Secondary | ICD-10-CM

## 2019-09-22 NOTE — Therapy (Signed)
[place] MAIN [facility] SERVICES [address] [place], [place], [address] Phone: [phone]   Fax:  [phone]  Occupational Therapy Treatment  Patient Details  Name: Edward Pitts MRN: [id number] Date of Birth: Jul 04, 1960 Referring Provider (OT): [name], MD   Encounter Date: 09/22/2019   OT End of Session - 09/22/19 1645    Visit Number 21    Number of Visits 36    Date for OT Re-Evaluation 10/22/19    OT Start Time 0311    OT Stop Time 0410    OT Time Calculation (min) 59 min    Equipment Utilized During Treatment Tactile Medical Flexitouch sequential pneumatic compression device , ("pump")    Activity Tolerance Patient tolerated treatment well;No increased pain    Behavior During Therapy WFL for tasks assessed/performed           Past Medical History:  Diagnosis Date  . Anxiety   . Arthritis   . Complication of anesthesia   . Family history of adverse reaction to anesthesia    PONV mother  . Hyperlipidemia   . Hypertension   . Melanoma ([place]) 2012  . PONV (postoperative nausea and vomiting) 04/16/2019  . Presence of permanent cardiac pacemaker    Medtronic  . Seizures ([place])     Past Surgical History:  Procedure Laterality Date  . KNEE SURGERY Left   . LEFT HEART CATH AND CORONARY ANGIOGRAPHY Left 06/29/2017   Procedure: LEFT HEART CATH AND CORONARY ANGIOGRAPHY;  Surgeon: [name], MD;  Location: [place] [facility];  Service: Cardiovascular;  Laterality: Left;  . LYMPH NODE DISSECTION Left 04/16/2019   Procedure: Left inguinal Lymph Node Dissection;  Surgeon: [name], MD;  Location: [place];  Service: General;  Laterality: Left;  Marland Kitchen MELANOMA EXCISION Left 04/16/2019   Procedure: MELANOMA EXCISION LEFT GROIN MASS;  Surgeon: [name], MD;  Location: [place];  Service: General;  Laterality: Left;  Marland Kitchen MELANOMA EXCISION WITH SENTINEL LYMPH NODE BIOPSY Left 2012   Left calf   . PACEMAKER INSERTION N/A 08/26/2018    Procedure: INSERTION PACEMAKER;  Surgeon: [name], MD;  Location: [facility];  Service: Cardiovascular;  Laterality: N/A;  . PORTA CATH INSERTION N/A 08/26/2019   Procedure: PORTA CATH INSERTION;  Surgeon: Katha Cabal, MD;  Location: [place] [facility];  Service: Cardiovascular;  Laterality: N/A;  . TEMPORARY PACEMAKER N/A 08/25/2018   Procedure: TEMPORARY PACEMAKER;  Surgeon: [name], MD;  Location: [place] [facility];  Service: Cardiovascular;  Laterality: N/A;    There were no vitals filed for this visit.   Subjective Assessment - 09/22/19 1515    Subjective  Edward Pitts presents to OT for visit 21/36 to address LLE lymphedema 2/2 melanoma Rx. Pt presents with full length LLE compression wraps in place. Pt reports 7/10 L knee pain is unchanged again today. When asked how leg swelling is doing, Pt states, "It's staying about the same." Pt reports Flexitouch  device has been delivered and manufacturer's trainer is coming tomorrow to his home to assist with set up and care and use training.    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 w/ LN disection (-7/7 LN); Completed adjuvant XRT 07/07/2019; 08/2018 Pacemaker placed; L knee sx date?;    Limitations chronic LLE knee pain; post surgical LLE pain,  chronic LLE/LLQ swelling, decreased LLE strength, impaired gait, decreased dynamic balance, altered sensation LLE, increased risk of  infection, decreased standing and walking tolerance > 1 hr; decreased ankle AROM    Repetition Increases Symptoms    Special Tests +Stemmer base L toes    Patient Stated Goals reduce swelling to normal and keep it from getting worse    Pain Onset More than a month ago                        OT Treatments/Exercises (OP) - 09/22/19 0001      ADLs   ADL Education Given Yes      Manual Therapy   Manual Therapy Edema management    Edema Management fibrosis techniques  and MR to L ankle , distal leg and foot    Manual Lymphatic Drainage (MLD) MLDto LLE/LLQ in supine and sidelying utilizing short neck sequence, deep abdominals with diaphragmatic breathing, ipsilateral inguinal -axiillary anastomosis and proximal to distal leg sequence.     Compression Bandaging thigh length multi layer gradient compression wraps to LLE- short stretch                  OT Education - 09/22/19 1644    Education Details Continued skilled Pt/caregiver education  And LE ADL training throughout visit for lymphedema self care/ home program, including compression wrapping, compression garment and device wear/care, lymphatic pumping ther ex, simple self-MLD, and skin care. Discussed progress towards goals.    Person(s) Educated Patient;Spouse    Methods Explanation;Demonstration;Handout    Comprehension Verbalized understanding;Returned demonstration;Need further instruction               OT Long Term Goals - 09/18/19 1303      OT LONG TERM GOAL #1   Title Pt will be able to apply thigh length, multi-layer, short stretch compression wraps daily to single limb using correct gradient techniques with Maximum assistance to achieve optimal limb volume reduction, to return affected limb/s, as closely as possible, to premorbid size and shape, to limit infection risk, and to improve safe functional ambulation and mobility.    Status Revised   Pt unable to bend left knee to extent needed to appy compression garments independently. Cargiver is independent with gradient techniques.     OT LONG TERM GOAL #2   Title Pt will be able to verbalize signs and symptoms of cellulitis infection and identify at least 4 common lymphedema precautions using printed resource for reference to limit LE progression over time to limit risk of infection and LE exacerbation.    Status Achieved      OT LONG TERM GOAL #3   Status Achieved      OT LONG TERM GOAL #4   Status Achieved   achieved this  date.. Goal met for LLE     OT LONG TERM GOAL #5   Status Achieved      OT LONG TERM GOAL #6   Status On-going                 Plan - 09/22/19 1647    Clinical Impression Statement MLD to LLE and LLQ directing flow towards deep abdominal pathways and ipsilateral inguinal - axillary anastamosis. Applied fibrosis techniques to foot and lateral ankle with slight softening at lateral ankle at end of session.Pt tolerated  manual therapy well without increased pain. Cont as per POC.    OT Occupational Profile and History Comprehensive Assessment- Review of records and extensive additional review of physical, cognitive, psychosocial history related to current functional performance    Occupational  performance deficits (Please refer to evaluation for details): ADL's;IADL's;Rest and Sleep;Work;Leisure;Social Participation;Other   body image, role performance   Body Structure / Function / Physical Skills ADL;ROM;Scar mobility;IADL;Edema;Balance;Sensation;Skin integrity;Mobility;Flexibility;Strength;Decreased knowledge of precautions;Gait;Pain;Decreased knowledge of use of DME    Rehab Potential Good    Clinical Decision Making Several treatment options, min-mod task modification necessary    Comorbidities Affecting Occupational Performance: Presence of comorbidities impacting occupational performance    Modification or Assistance to Complete Evaluation  Min-Moderate modification of tasks or assist with assess necessary to complete eval    OT Frequency 2x / week    OT Duration 12 weeks   and PRN   OT Treatment/Interventions Self-care/ADL training;Therapeutic exercise;Functional Mobility Training;Manual Therapy;Coping strategies training;Therapeutic activities;Manual lymph drainage;Energy conservation;DME and/or AE instruction;Compression bandaging;Other (comment);Scar mobilization;Patient/family education   fit with appropriate compression garments once swelling is reduced   Plan Complete  Decongestive Therapy (CDT), Intensive and Management Phases to include  manual lymphatyic drainage (MLD), skin care, therapeutic exercise, compression with short stretch wraps and garments, Pt edu for LE sef care    Consulted and Agree with Plan of Care Patient           Patient will benefit from skilled therapeutic intervention in order to improve the following deficits and impairments:   Body Structure / Function / Physical Skills: ADL, ROM, Scar mobility, IADL, Edema, Balance, Sensation, Skin integrity, Mobility, Flexibility, Strength, Decreased knowledge of precautions, Gait, Pain, Decreased knowledge of use of DME       Visit Diagnosis: Lymphedema, not elsewhere classified    Problem List Patient Active Problem List   Diagnosis Date Noted  . Vitiligo 09/03/2019  . Port-A-Cath in place 08/20/2019  . Encounter for antineoplastic immunotherapy 08/06/2019  . Malignant melanoma of overlapping sites ([place]) 04/23/2019  . Goals of care, counseling/discussion 04/23/2019  . 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 Near syncope 12/17/2018  . Anemia in chronic kidney disease 12/11/2018  . Benign hypertensive kidney disease with chronic kidney disease 12/11/2018  . Stage 3a chronic kidney disease 12/11/2018  . AKI (acute kidney injury) ([place]) 08/24/2018  . Acute hyperkalemia 08/24/2018  . HTN (hypertension) 08/24/2018  . HLD (hyperlipidemia) 08/24/2018  . Seizures ([place]) 08/24/2018  . Complete heart block ([place]) 08/24/2018  . Arthritis of wrist, left 06/12/2018  . Bradycardia 06/04/2018  . 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 Positive ANA (antinuclear antibody) 03/05/2018  . Swelling of joint of left wrist 03/05/2018  . Coronary artery disease involving native coronary artery of native heart 07/09/2017  . Palpitations 07/09/2017  . Stable angina ([place]) 06/27/2017  . Benign essential HTN 05/25/2017  . LBBB (left bundle branch block) 05/25/2017     Andrey Spearman, MS, OTR/L, [facility] 09/22/19 4:49 PM   [place] MAIN [facility] SERVICES [address] [place], [place], [address] Phone: [phone]   Fax:  [phone]  Name: Saunders [place] MRN: [id number] Date of Birth: May 30, 1960

## 2019-09-24 ENCOUNTER — Ambulatory Visit: Payer: BC Managed Care – PPO | Admitting: Occupational Therapy

## 2019-09-24 ENCOUNTER — Other Ambulatory Visit: Payer: Self-pay

## 2019-09-24 DIAGNOSIS — I890 Lymphedema, not elsewhere classified: Secondary | ICD-10-CM

## 2019-09-24 NOTE — Therapy (Signed)
[place] [facility] MAIN [facility] SERVICES [address] [place], [place], [address] Phone: [phone]   Fax:  [phone]  Occupational Therapy Treatment  Patient Details  Name: Edward Pitts MRN: [id number] Date of Birth: Jan 26, 1961 Referring Provider (OT): Rickard Patience, MD   Encounter Date: 09/24/2019   OT End of Session - 09/24/19 1645    Visit Number 22    Number of Visits 36    Date for OT Re-Evaluation 10/22/19    OT Start Time 0300    OT Stop Time 0405    OT Time Calculation (min) 65 min           Past Medical History:  Diagnosis Date  . Anxiety   . Arthritis   . Complication of anesthesia   . Family history of adverse reaction to anesthesia    PONV mother  . Hyperlipidemia   . Hypertension   . Melanoma (HCC) 2012  . PONV (postoperative nausea and vomiting) 04/16/2019  . Presence of permanent cardiac pacemaker    Medtronic  . Seizures (HCC)     Past Surgical History:  Procedure Laterality Date  . KNEE SURGERY Left   . LEFT HEART CATH AND CORONARY ANGIOGRAPHY Left 06/29/2017   Procedure: LEFT HEART CATH AND CORONARY ANGIOGRAPHY;  Surgeon: Lamar Blinks, MD;  Location: [facility] INVASIVE CV LAB;  Service: Cardiovascular;  Laterality: Left;  . LYMPH NODE DISSECTION Left 04/16/2019   Procedure: Left inguinal Lymph Node Dissection;  Surgeon: Almond Lint, MD;  Location: [facility] OR;  Service: General;  Laterality: Left;  Marland Kitchen MELANOMA EXCISION Left 04/16/2019   Procedure: MELANOMA EXCISION LEFT GROIN MASS;  Surgeon: Almond Lint, MD;  Location: [facility] OR;  Service: General;  Laterality: Left;  Marland Kitchen MELANOMA EXCISION WITH SENTINEL LYMPH NODE BIOPSY Left 2012   Left calf   . PACEMAKER INSERTION N/A 08/26/2018   Procedure: INSERTION PACEMAKER;  Surgeon: Marcina Millard, MD;  Location: [facility] ORS;  Service: Cardiovascular;  Laterality: N/A;  . PORTA CATH INSERTION N/A 08/26/2019   Procedure: PORTA CATH INSERTION;  Surgeon: Renford Dills, MD;  Location: [facility]  INVASIVE CV LAB;  Service: Cardiovascular;  Laterality: N/A;  . TEMPORARY PACEMAKER N/A 08/25/2018   Procedure: TEMPORARY PACEMAKER;  Surgeon: Tonny Bollman, MD;  Location: [facility] INVASIVE CV LAB;  Service: Cardiovascular;  Laterality: N/A;    There were no vitals filed for this visit.   Subjective Assessment - 09/24/19 1640    Subjective  Edward Pitts presents to OT for visit 22/36 to address LLE lymphedema 2/2 melanoma Rx. Pt presents with full length LLE compression wraps in place. Pt reports knee pain is unchanged from last visit.. Pt treports trainer from [employer] assisted him with set up and device care and use training at his home yesterday. Pt reports he he was able to use  the device independentlythis morning without difficulty.    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 w/ LN disection (-7/7 LN); Completed adjuvant XRT 07/07/2019; 08/2018 Pacemaker placed; L knee sx date?;    Limitations chronic LLE knee pain; post surgical LLE pain,  chronic LLE/LLQ swelling, decreased LLE strength, impaired gait, decreased dynamic balance, altered sensation LLE, increased risk of infection, decreased standing and walking tolerance > 1 hr; decreased ankle AROM    Repetition Increases Symptoms    Special Tests +Stemmer base L toes    Patient Stated Goals reduce swelling to normal and keep it from getting worse  Pain Onset More than a month ago                                OT Education - 09/24/19 1642    Education Details Reviewed precautions for pump use , including signs and symptomes of cellulitis infection and DVT. Pt instructed not to use device if he is diagnosed with an infection in his leg or has the flu. Pt instructed to DC "pump" immediately and call his doctor immediately if he experiences acute pain or acute onset of SOB with device use.    Person(s) Educated Patient;Spouse    Methods  Explanation;Demonstration;Handout    Comprehension Verbalized understanding;Returned demonstration;Need further instruction               OT Long Term Goals - 09/18/19 1303      OT LONG TERM GOAL #1   Title Pt will be able to apply thigh length, multi-layer, short stretch compression wraps daily to single limb using correct gradient techniques with Maximum assistance to achieve optimal limb volume reduction, to return affected limb/s, as closely as possible, to premorbid size and shape, to limit infection risk, and to improve safe functional ambulation and mobility.    Status Revised   Pt unable to bend left knee to extent needed to appy compression garments independently. Cargiver is independent with gradient techniques.     OT LONG TERM GOAL #2   Title Pt will be able to verbalize signs and symptoms of cellulitis infection and identify at least 4 common lymphedema precautions using printed resource for reference to limit LE progression over time to limit risk of infection and LE exacerbation.    Status Achieved      OT LONG TERM GOAL #3   Status Achieved      OT LONG TERM GOAL #4   Status Achieved   achieved this date.. Goal met for LLE     OT LONG TERM GOAL #5   Status Achieved      OT LONG TERM GOAL #6   Status On-going                 Plan - 09/24/19 1646    Clinical Impression Statement Provided MLD and skin care to LLE/LLQ as established without increased pain. Pt verbalized understanding of precautions  for use of sequential pneumatic compression "pump" he received ay home yesterday. Pt able to usedevice independently and confidently this AM without difficulty. Cont as per POC.    OT Occupational Profile and History Comprehensive Assessment- Review of records and extensive additional review of physical, cognitive, psychosocial history related to current functional performance    Occupational performance deficits (Please refer to evaluation for details):  ADL's;IADL's;Rest and Sleep;Work;Leisure;Social Participation;Other   body image, role performance   Body Structure / Function / Physical Skills ADL;ROM;Scar mobility;IADL;Edema;Balance;Sensation;Skin integrity;Mobility;Flexibility;Strength;Decreased knowledge of precautions;Gait;Pain;Decreased knowledge of use of DME    Rehab Potential Good    Clinical Decision Making Several treatment options, min-mod task modification necessary    Comorbidities Affecting Occupational Performance: Presence of comorbidities impacting occupational performance    Modification or Assistance to Complete Evaluation  Min-Moderate modification of tasks or assist with assess necessary to complete eval    OT Frequency 2x / week    OT Duration 12 weeks   and PRN   OT Treatment/Interventions Self-care/ADL training;Therapeutic exercise;Functional Mobility Training;Manual Therapy;Coping strategies training;Therapeutic activities;Manual lymph drainage;Energy conservation;DME and/or AE instruction;Compression bandaging;Other (comment);Scar mobilization;Patient/family  education   fit with appropriate compression garments once swelling is reduced   Plan Complete Decongestive Therapy (CDT), Intensive and Management Phases to include  manual lymphatyic drainage (MLD), skin care, therapeutic exercise, compression with short stretch wraps and garments, Pt edu for LE sef care    Consulted and Agree with Plan of Care Patient           Patient will benefit from skilled therapeutic intervention in order to improve the following deficits and impairments:   Body Structure / Function / Physical Skills: ADL, ROM, Scar mobility, IADL, Edema, Balance, Sensation, Skin integrity, Mobility, Flexibility, Strength, Decreased knowledge of precautions, Gait, Pain, Decreased knowledge of use of DME       Visit Diagnosis: Lymphedema, not elsewhere classified    Problem List Patient Active Problem List   Diagnosis Date Noted  . Vitiligo  09/03/2019  . Port-A-Cath in place 08/20/2019  . Encounter for antineoplastic immunotherapy 08/06/2019  . Malignant melanoma of overlapping sites ([place]) 04/23/2019  . Goals of care, counseling/discussion 04/23/2019  . 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 Near syncope 12/17/2018  . Anemia in chronic kidney disease 12/11/2018  . Benign hypertensive kidney disease with chronic kidney disease 12/11/2018  . Stage 3a chronic kidney disease 12/11/2018  . AKI (acute kidney injury) ([place]) 08/24/2018  . Acute hyperkalemia 08/24/2018  . HTN (hypertension) 08/24/2018  . HLD (hyperlipidemia) 08/24/2018  . Seizures ([place]) 08/24/2018  . Complete heart block ([place]) 08/24/2018  . Arthritis of wrist, left 06/12/2018  . Bradycardia 06/04/2018  . 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 Positive ANA (antinuclear antibody) 03/05/2018  . Swelling of joint of left wrist 03/05/2018  . Coronary artery disease involving native coronary artery of native heart 07/09/2017  . Palpitations 07/09/2017  . Stable angina ([place]) 06/27/2017  . Benign essential HTN 05/25/2017  . LBBB (left bundle branch block) 05/25/2017    Andrey Spearman, MS, OTR/L, [facility] 09/24/19 4:49 PM  [place] MAIN [facility] SERVICES [address] [place], [place], [address] Phone: [phone]   Fax:  [phone]  Name: Edward Pitts MRN: [id number] Date of Birth: 1960/05/07

## 2019-09-25 ENCOUNTER — Other Ambulatory Visit: Payer: Self-pay

## 2019-09-25 ENCOUNTER — Ambulatory Visit: Admitting: Occupational Therapy

## 2019-09-25 ENCOUNTER — Ambulatory Visit: Attending: Oncology | Admitting: Occupational Therapy

## 2019-09-25 DIAGNOSIS — I890 Lymphedema, not elsewhere classified: Secondary | ICD-10-CM | POA: Diagnosis present

## 2019-09-25 NOTE — Therapy (Signed)
[place] MAIN [facility] SERVICES [address] [place], [place], [address] Phone: [phone]   Fax:  [phone]  Occupational Therapy Treatment  Patient Details  Name: Edward Pitts MRN: [id number] Date of Birth: July 21, 1960 Referring Provider (OT): [name], MD   Encounter Date: 09/25/2019   OT End of Session - 09/25/19 1359    Visit Number 23    Number of Visits 36    Date for OT Re-Evaluation 10/22/19    OT Start Time 0158    OT Stop Time 0318    OT Time Calculation (min) 80 min    Activity Tolerance Patient tolerated treatment well;No increased pain    Behavior During Therapy WFL for tasks assessed/performed           Past Medical History:  Diagnosis Date  . Anxiety   . Arthritis   . Complication of anesthesia   . Family history of adverse reaction to anesthesia    PONV mother  . Hyperlipidemia   . Hypertension   . Melanoma ([place]) 2012  . PONV (postoperative nausea and vomiting) 04/16/2019  . Presence of permanent cardiac pacemaker    Medtronic  . Seizures ([place])     Past Surgical History:  Procedure Laterality Date  . KNEE SURGERY Left   . LEFT HEART CATH AND CORONARY ANGIOGRAPHY Left 06/29/2017   Procedure: LEFT HEART CATH AND CORONARY ANGIOGRAPHY;  Surgeon: [name], MD;  Location: [place] CV LAB;  Service: Cardiovascular;  Laterality: Left;  . LYMPH NODE DISSECTION Left 04/16/2019   Procedure: Left inguinal Lymph Node Dissection;  Surgeon: [name], MD;  Location: [place];  Service: General;  Laterality: Left;  Marland Kitchen MELANOMA EXCISION Left 04/16/2019   Procedure: MELANOMA EXCISION LEFT GROIN MASS;  Surgeon: [name], MD;  Location: [place];  Service: General;  Laterality: Left;  Marland Kitchen MELANOMA EXCISION WITH SENTINEL LYMPH NODE BIOPSY Left 2012   Left calf   . PACEMAKER INSERTION N/A 08/26/2018   Procedure: INSERTION PACEMAKER;  Surgeon: [name], MD;  Location: [facility] ORS;  Service: Cardiovascular;   Laterality: N/A;  . PORTA CATH INSERTION N/A 08/26/2019   Procedure: PORTA CATH INSERTION;  Surgeon: Katha Cabal, MD;  Location: [place] CV LAB;  Service: Cardiovascular;  Laterality: N/A;  . TEMPORARY PACEMAKER N/A 08/25/2018   Procedure: TEMPORARY PACEMAKER;  Surgeon: [name], MD;  Location: [place] CV LAB;  Service: Cardiovascular;  Laterality: N/A;    There were no vitals filed for this visit.   Subjective Assessment - 09/25/19 1358    Subjective  Edward Pitts presents to OT for visit 23/36 to address LLE lymphedema 2/2 melanoma Rx. Pt presents without  LLE compression wraps in place. Pt reports knee pain is unchanged from last visit.    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 w/ LN disection (-7/7 LN); Completed adjuvant XRT 07/07/2019; 08/2018 Pacemaker placed; L knee sx date?;    Limitations chronic LLE knee pain; post surgical LLE pain,  chronic LLE/LLQ swelling, decreased LLE strength, impaired gait, decreased dynamic balance, altered sensation LLE, increased risk of infection, decreased standing and walking tolerance > 1 hr; decreased ankle AROM    Repetition Increases Symptoms    Special Tests +Stemmer base L toes    Patient Stated Goals reduce swelling to normal and keep it from getting worse    Pain Onset More than a month ago  OT Treatments/Exercises (OP) - 09/25/19 0001      ADLs   ADL Education Given Yes      Manual Therapy   Manual Therapy Edema management;Compression Bandaging    Manual therapy comments anatomical measurements for custom compression garments and HOS device    Compression Bandaging thigh length multi layer gradient compression wraps to LLE- short stretch                  OT Education - 09/25/19 1519    Education Details Continued skilled Pt/caregiver education  And LE ADL training throughout visit for lymphedema self care/  home program, including compression wrapping, compression garment and device wear/care, lymphatic pumping ther ex, simple self-MLD, and skin care. Discussed progress towards goals.    Person(s) Educated Patient;Spouse    Methods Explanation;Demonstration;Handout    Comprehension Verbalized understanding;Returned demonstration;Need further instruction               OT Long Term Goals - 09/18/19 1303      OT LONG TERM GOAL #1   Title Pt will be able to apply thigh length, multi-layer, short stretch compression wraps daily to single limb using correct gradient techniques with Maximum assistance to achieve optimal limb volume reduction, to return affected limb/s, as closely as possible, to premorbid size and shape, to limit infection risk, and to improve safe functional ambulation and mobility.    Status Revised   Pt unable to bend left knee to extent needed to appy compression garments independently. Cargiver is independent with gradient techniques.     OT LONG TERM GOAL #2   Title Pt will be able to verbalize signs and symptoms of cellulitis infection and identify at least 4 common lymphedema precautions using printed resource for reference to limit LE progression over time to limit risk of infection and LE exacerbation.    Status Achieved      OT LONG TERM GOAL #3   Status Achieved      OT LONG TERM GOAL #4   Status Achieved   achieved this date.. Goal met for LLE     OT LONG TERM GOAL #5   Status Achieved      OT LONG TERM GOAL #6   Status On-going                 Plan - 09/25/19 1521    Clinical Impression Statement Completed anatomical measurements for Edward Pitts, custom Elvarex , flat knit, 1 1/2 leg pantyhose (ccl 3 LEFT leg extension, ccl 1 panty portion, ccl 1 R leg extension) and thigh length Edward Pitts convoluted HOS compression device essential for mitigating fluid reaccumulation and fibrosis formation. Submitted to DME vendor via fa. Applied A-G LLE compression wrap as  established. Cont as per POC.    OT Occupational Profile and History Comprehensive Assessment- Review of records and extensive additional review of physical, cognitive, psychosocial history related to current functional performance    Occupational performance deficits (Please refer to evaluation for details): ADL's;IADL's;Rest and Sleep;Work;Leisure;Social Participation;Other   body image, role performance   Body Structure / Function / Physical Skills ADL;ROM;Scar mobility;IADL;Edema;Balance;Sensation;Skin integrity;Mobility;Flexibility;Strength;Decreased knowledge of precautions;Gait;Pain;Decreased knowledge of use of DME    Rehab Potential Good    Clinical Decision Making Several treatment options, min-mod task modification necessary    Comorbidities Affecting Occupational Performance: Presence of comorbidities impacting occupational performance    Modification or Assistance to Complete Evaluation  Min-Moderate modification of tasks or assist with assess necessary to complete eval    OT  Frequency 2x / week    OT Duration 12 weeks   and PRN   OT Treatment/Interventions Self-care/ADL training;Therapeutic exercise;Functional Mobility Training;Manual Therapy;Coping strategies training;Therapeutic activities;Manual lymph drainage;Energy conservation;DME and/or AE instruction;Compression bandaging;Other (comment);Scar mobilization;Patient/family education   fit with appropriate compression garments once swelling is reduced   Plan Complete Decongestive Therapy (CDT), Intensive and Management Phases to include  manual lymphatyic drainage (MLD), skin care, therapeutic exercise, compression with short stretch wraps and garments, Pt edu for LE sef care    Consulted and Agree with Plan of Care Patient           Patient will benefit from skilled therapeutic intervention in order to improve the following deficits and impairments:   Body Structure / Function / Physical Skills: ADL, ROM, Scar mobility, IADL,  Edema, Balance, Sensation, Skin integrity, Mobility, Flexibility, Strength, Decreased knowledge of precautions, Gait, Pain, Decreased knowledge of use of DME       Visit Diagnosis: Lymphedema, not elsewhere classified    Problem List Patient Active Problem List   Diagnosis Date Noted  . Vitiligo 09/03/2019  . Port-A-Cath in place 08/20/2019  . Encounter for antineoplastic immunotherapy 08/06/2019  . Malignant melanoma of overlapping sites ([place]) 04/23/2019  . Goals of care, counseling/discussion 04/23/2019  . 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 Near syncope 12/17/2018  . Anemia in chronic kidney disease 12/11/2018  . Benign hypertensive kidney disease with chronic kidney disease 12/11/2018  . Stage 3a chronic kidney disease 12/11/2018  . AKI (acute kidney injury) ([place]) 08/24/2018  . Acute hyperkalemia 08/24/2018  . HTN (hypertension) 08/24/2018  . HLD (hyperlipidemia) 08/24/2018  . Seizures ([place]) 08/24/2018  . Complete heart block ([place]) 08/24/2018  . Arthritis of wrist, left 06/12/2018  . Bradycardia 06/04/2018  . 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 Positive ANA (antinuclear antibody) 03/05/2018  . Swelling of joint of left wrist 03/05/2018  . Coronary artery disease involving native coronary artery of native heart 07/09/2017  . Palpitations 07/09/2017  . Stable angina ([place]) 06/27/2017  . Benign essential HTN 05/25/2017  . LBBB (left bundle branch block) 05/25/2017   Andrey Spearman, MS, OTR/L, [facility] 09/25/19 3:24 PM   [place] MAIN [facility] SERVICES [address] [place], [place], [address] Phone: [phone]   Fax:  [phone]  Name: Shakeel Disney MRN: [id number] Date of Birth: 1960-10-30

## 2019-09-30 ENCOUNTER — Ambulatory Visit: Admitting: Occupational Therapy

## 2019-09-30 ENCOUNTER — Other Ambulatory Visit: Payer: Self-pay

## 2019-09-30 DIAGNOSIS — I890 Lymphedema, not elsewhere classified: Secondary | ICD-10-CM

## 2019-09-30 NOTE — Therapy (Signed)
[place] MAIN [facility] SERVICES [address] [place], [place], [address] Phone: [phone]   Fax:  [phone]  Occupational Therapy Treatment  Patient Details  Name: Edward Pitts MRN: [id number] Date of Birth: 1960/07/30 Referring Provider (OT): [name], MD   Encounter Date: 09/30/2019   OT End of Session - 09/30/19 1216    Visit Number 24    Number of Visits 36    Date for OT Re-Evaluation 10/22/19    OT Start Time 1107    OT Stop Time 4536    OT Time Calculation (min) 67 min    Activity Tolerance Patient tolerated treatment well;No increased pain    Behavior During Therapy WFL for tasks assessed/performed           Past Medical History:  Diagnosis Date  . Anxiety   . Arthritis   . Complication of anesthesia   . Family history of adverse reaction to anesthesia    PONV mother  . Hyperlipidemia   . Hypertension   . Melanoma ([place]) 2012  . PONV (postoperative nausea and vomiting) 04/16/2019  . Presence of permanent cardiac pacemaker    Medtronic  . Seizures ([place])     Past Surgical History:  Procedure Laterality Date  . KNEE SURGERY Left   . LEFT HEART CATH AND CORONARY ANGIOGRAPHY Left 06/29/2017   Procedure: LEFT HEART CATH AND CORONARY ANGIOGRAPHY;  Surgeon: [name], MD;  Location: [place] [facility];  Service: Cardiovascular;  Laterality: Left;  . LYMPH NODE DISSECTION Left 04/16/2019   Procedure: Left inguinal Lymph Node Dissection;  Surgeon: [name], MD;  Location: [place];  Service: General;  Laterality: Left;  Marland Kitchen MELANOMA EXCISION Left 04/16/2019   Procedure: MELANOMA EXCISION LEFT GROIN MASS;  Surgeon: [name], MD;  Location: [place];  Service: General;  Laterality: Left;  Marland Kitchen MELANOMA EXCISION WITH SENTINEL LYMPH NODE BIOPSY Left 2012   Left calf   . PACEMAKER INSERTION N/A 08/26/2018   Procedure: INSERTION PACEMAKER;  Surgeon: [name], MD;  Location: [facility];  Service: Cardiovascular;   Laterality: N/A;  . PORTA CATH INSERTION N/A 08/26/2019   Procedure: PORTA CATH INSERTION;  Surgeon: Katha Cabal, MD;  Location: [place] [facility];  Service: Cardiovascular;  Laterality: N/A;  . TEMPORARY PACEMAKER N/A 08/25/2018   Procedure: TEMPORARY PACEMAKER;  Surgeon: [name], MD;  Location: [place] [facility];  Service: Cardiovascular;  Laterality: N/A;    There were no vitals filed for this visit.   Subjective Assessment - 09/30/19 1114    Subjective  [name] presents to OT for visit 23/36 to address LLE lymphedema 2/2 melanoma Rx. Pt presents without  LLE compression wraps in place. Pt reports knee pain is unchanged from last visit.    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 w/ LN disection (-7/7 LN); Completed adjuvant XRT 07/07/2019; 08/2018 Pacemaker placed; L knee sx date?;    Limitations chronic LLE knee pain; post surgical LLE pain,  chronic LLE/LLQ swelling, decreased LLE strength, impaired gait, decreased dynamic balance, altered sensation LLE, increased risk of infection, decreased standing and walking tolerance > 1 hr; decreased ankle AROM    Repetition Increases Symptoms    Special Tests +Stemmer base L toes    Patient Stated Goals reduce swelling to normal and keep it from getting worse    Pain Onset More than a month ago  OT Treatments/Exercises (OP) - 09/30/19 0001      ADLs   ADL Education Given Yes                  OT Education - 09/30/19 1215    Education Details Continued skilled Pt/caregiver education  And LE ADL training throughout visit for lymphedema self care/ home program, including compression wrapping, compression garment and device wear/care, lymphatic pumping ther ex, simple self-MLD, and skin care. Discussed progress towards goals.    Person(s) Educated Patient;Spouse    Methods Explanation;Demonstration;Handout     Comprehension Verbalized understanding;Returned demonstration;Need further instruction               OT Long Term Goals - 09/18/19 1303      OT LONG TERM GOAL #1   Title Pt will be able to apply thigh length, multi-layer, short stretch compression wraps daily to single limb using correct gradient techniques with Maximum assistance to achieve optimal limb volume reduction, to return affected limb/s, as closely as possible, to premorbid size and shape, to limit infection risk, and to improve safe functional ambulation and mobility.    Status Revised   Pt unable to bend left knee to extent needed to appy compression garments independently. Cargiver is independent with gradient techniques.     OT LONG TERM GOAL #2   Title Pt will be able to verbalize signs and symptoms of cellulitis infection and identify at least 4 common lymphedema precautions using printed resource for reference to limit LE progression over time to limit risk of infection and LE exacerbation.    Status Achieved      OT LONG TERM GOAL #3   Status Achieved      OT LONG TERM GOAL #4   Status Achieved   achieved this date.. Goal met for LLE     OT LONG TERM GOAL #5   Status Achieved      OT LONG TERM GOAL #6   Status On-going                 Plan - 09/30/19 1217    Clinical Impression Statement MLD to LLE and LLQ directing flow towards deep abdominal pathways and ipsilateral inguinal - axillary anastamosis. Applied fibrosis techniques and myofasial release to decrease protien ritch density. Stubborn LLE lymphedema appears slightly reduced in thigh  at end of sessio.. Cont as per POC.    OT Occupational Profile and History Comprehensive Assessment- Review of records and extensive additional review of physical, cognitive, psychosocial history related to current functional performance    Occupational performance deficits (Please refer to evaluation for details): ADL's;IADL's;Rest and Sleep;Work;Leisure;Social  Participation;Other   body image, role performance   Body Structure / Function / Physical Skills ADL;ROM;Scar mobility;IADL;Edema;Balance;Sensation;Skin integrity;Mobility;Flexibility;Strength;Decreased knowledge of precautions;Gait;Pain;Decreased knowledge of use of DME    Rehab Potential Good    Clinical Decision Making Several treatment options, min-mod task modification necessary    Comorbidities Affecting Occupational Performance: Presence of comorbidities impacting occupational performance    Modification or Assistance to Complete Evaluation  Min-Moderate modification of tasks or assist with assess necessary to complete eval    OT Frequency 2x / week    OT Duration 12 weeks   and PRN   OT Treatment/Interventions Self-care/ADL training;Therapeutic exercise;Functional Mobility Training;Manual Therapy;Coping strategies training;Therapeutic activities;Manual lymph drainage;Energy conservation;DME and/or AE instruction;Compression bandaging;Other (comment);Scar mobilization;Patient/family education   fit with appropriate compression garments once swelling is reduced   Plan Complete Decongestive Therapy (CDT), Intensive and Management Phases to  include  manual lymphatyic drainage (MLD), skin care, therapeutic exercise, compression with short stretch wraps and garments, Pt edu for LE sef care    Consulted and Agree with Plan of Care Patient           Patient will benefit from skilled therapeutic intervention in order to improve the following deficits and impairments:   Body Structure / Function / Physical Skills: ADL, ROM, Scar mobility, IADL, Edema, Balance, Sensation, Skin integrity, Mobility, Flexibility, Strength, Decreased knowledge of precautions, Gait, Pain, Decreased knowledge of use of DME       Visit Diagnosis: Lymphedema, not elsewhere classified    Problem List Patient Active Problem List   Diagnosis Date Noted  . Vitiligo 09/03/2019  . Port-A-Cath in place 08/20/2019  .  Encounter for antineoplastic immunotherapy 08/06/2019  . Malignant melanoma of overlapping sites ([place]) 04/23/2019  . Goals of care, counseling/discussion 04/23/2019  . 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 Near syncope 12/17/2018  . Anemia in chronic kidney disease 12/11/2018  . Benign hypertensive kidney disease with chronic kidney disease 12/11/2018  . Stage 3a chronic kidney disease 12/11/2018  . AKI (acute kidney injury) ([place]) 08/24/2018  . Acute hyperkalemia 08/24/2018  . HTN (hypertension) 08/24/2018  . HLD (hyperlipidemia) 08/24/2018  . Seizures ([place]) 08/24/2018  . Complete heart block ([place]) 08/24/2018  . Arthritis of wrist, left 06/12/2018  . Bradycardia 06/04/2018  . 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 Positive ANA (antinuclear antibody) 03/05/2018  . Swelling of joint of left wrist 03/05/2018  . Coronary artery disease involving native coronary artery of native heart 07/09/2017  . Palpitations 07/09/2017  . Stable angina ([place]) 06/27/2017  . Benign essential HTN 05/25/2017  . LBBB (left bundle branch block) 05/25/2017   Andrey Spearman, MS, OTR/L, [facility] 09/30/19 12:19 PM   [place] MAIN [facility] SERVICES [address] [place], [place], [address] Phone: [phone]   Fax:  [phone]  Name: Brayten Komar MRN: [id number] Date of Birth: [birth year]

## 2019-10-01 ENCOUNTER — Ambulatory Visit: Admitting: Occupational Therapy

## 2019-10-01 ENCOUNTER — Other Ambulatory Visit: Payer: Self-pay

## 2019-10-01 DIAGNOSIS — I890 Lymphedema, not elsewhere classified: Secondary | ICD-10-CM | POA: Diagnosis not present

## 2019-10-01 NOTE — Therapy (Signed)
[place] MAIN [facility] SERVICES [address] [place], [place], [address] Phone: [phone]   Fax:  [phone]  Occupational Therapy Treatment  Patient Details  Name: Edward Pitts MRN: [id number] Date of Birth: 11-Aug-1960 Referring Provider (OT): [name], MD   Encounter Date: 10/01/2019   OT End of Session - 10/01/19 1533    Visit Number 25    Number of Visits 36    Date for OT Re-Evaluation 10/22/19    OT Start Time 0202    OT Stop Time 0315    OT Time Calculation (min) 73 min    Activity Tolerance Patient tolerated treatment well;No increased pain    Behavior During Therapy WFL for tasks assessed/performed           Past Medical History:  Diagnosis Date  . Anxiety   . Arthritis   . Complication of anesthesia   . Family history of adverse reaction to anesthesia    PONV mother  . Hyperlipidemia   . Hypertension   . Melanoma ([place]) 2012  . PONV (postoperative nausea and vomiting) 04/16/2019  . Presence of permanent cardiac pacemaker    Medtronic  . Seizures ([place])     Past Surgical History:  Procedure Laterality Date  . KNEE SURGERY Left   . LEFT HEART CATH AND CORONARY ANGIOGRAPHY Left 06/29/2017   Procedure: LEFT HEART CATH AND CORONARY ANGIOGRAPHY;  Surgeon: [name], MD;  Location: [place] [facility];  Service: Cardiovascular;  Laterality: Left;  . LYMPH NODE DISSECTION Left 04/16/2019   Procedure: Left inguinal Lymph Node Dissection;  Surgeon: [name], MD;  Location: [place];  Service: General;  Laterality: Left;  Marland Kitchen MELANOMA EXCISION Left 04/16/2019   Procedure: MELANOMA EXCISION LEFT GROIN MASS;  Surgeon: [name], MD;  Location: [place];  Service: General;  Laterality: Left;  Marland Kitchen MELANOMA EXCISION WITH SENTINEL LYMPH NODE BIOPSY Left 2012   Left calf   . PACEMAKER INSERTION N/A 08/26/2018   Procedure: INSERTION PACEMAKER;  Surgeon: [name], MD;  Location: [facility];  Service: Cardiovascular;   Laterality: N/A;  . PORTA CATH INSERTION N/A 08/26/2019   Procedure: PORTA CATH INSERTION;  Surgeon: Katha Cabal, MD;  Location: [place] [facility];  Service: Cardiovascular;  Laterality: N/A;  . TEMPORARY PACEMAKER N/A 08/25/2018   Procedure: TEMPORARY PACEMAKER;  Surgeon: [name], MD;  Location: [place] [facility];  Service: Cardiovascular;  Laterality: N/A;    There were no vitals filed for this visit.                 OT Treatments/Exercises (OP) - 10/01/19 0001      ADLs   ADL Education Given Yes      Manual Therapy   Manual Therapy Edema management;Manual Lymphatic Drainage (MLD);Compression Bandaging    Manual Lymphatic Drainage (MLD) MLDto LLE/LLQ in supine and sidelying utilizing short neck sequence, deep abdominals with diaphragmatic breathing, ipsilateral inguinal -axiillary anastomosis and proximal to distal leg sequence.     Compression Bandaging thigh length multi layer gradient compression wraps to LLE- short stretch                  OT Education - 10/01/19 1533    Education Details Continued skilled Pt/caregiver education  And LE ADL training throughout visit for lymphedema self care/ home program, including compression wrapping, compression garment and device wear/care, lymphatic pumping ther ex, simple self-MLD, and skin care. Discussed progress towards goals.    Person(s)  Educated Patient;Spouse    Methods Explanation;Demonstration;Handout    Comprehension Verbalized understanding;Returned demonstration;Need further instruction               OT Long Term Goals - 09/18/19 1303      OT LONG TERM GOAL #1   Title Pt will be able to apply thigh length, multi-layer, short stretch compression wraps daily to single limb using correct gradient techniques with Maximum assistance to achieve optimal limb volume reduction, to return affected limb/s, as closely as possible, to premorbid size and shape, to limit infection risk, and to  improve safe functional ambulation and mobility.    Status Revised   Pt unable to bend left knee to extent needed to appy compression garments independently. Cargiver is independent with gradient techniques.     OT LONG TERM GOAL #2   Title Pt will be able to verbalize signs and symptoms of cellulitis infection and identify at least 4 common lymphedema precautions using printed resource for reference to limit LE progression over time to limit risk of infection and LE exacerbation.    Status Achieved      OT LONG TERM GOAL #3   Status Achieved      OT LONG TERM GOAL #4   Status Achieved   achieved this date.. Goal met for LLE     OT LONG TERM GOAL #5   Status Achieved      OT LONG TERM GOAL #6   Status On-going                 Plan - 10/01/19 1534    Clinical Impression Statement MLD to LLE and LLQ directing flow towards deep abdominal pathways and ipsilateral inguinal - axillary anastamosis. Applied fibrosis techniques to foot and lateral ankle with slight softening at lateral ankle at end of session.Pt tolerated  manual therapy well without increased pain. Cont as per POC.    OT Occupational Profile and History Comprehensive Assessment- Review of records and extensive additional review of physical, cognitive, psychosocial history related to current functional performance    Occupational performance deficits (Please refer to evaluation for details): ADL's;IADL's;Rest and Sleep;Work;Leisure;Social Participation;Other   body image, role performance   Body Structure / Function / Physical Skills ADL;ROM;Scar mobility;IADL;Edema;Balance;Sensation;Skin integrity;Mobility;Flexibility;Strength;Decreased knowledge of precautions;Gait;Pain;Decreased knowledge of use of DME    Rehab Potential Good    Clinical Decision Making Several treatment options, min-mod task modification necessary    Comorbidities Affecting Occupational Performance: Presence of comorbidities impacting occupational  performance    Modification or Assistance to Complete Evaluation  Min-Moderate modification of tasks or assist with assess necessary to complete eval    OT Frequency 2x / week    OT Duration 12 weeks   and PRN   OT Treatment/Interventions Self-care/ADL training;Therapeutic exercise;Functional Mobility Training;Manual Therapy;Coping strategies training;Therapeutic activities;Manual lymph drainage;Energy conservation;DME and/or AE instruction;Compression bandaging;Other (comment);Scar mobilization;Patient/family education   fit with appropriate compression garments once swelling is reduced   Plan Complete Decongestive Therapy (CDT), Intensive and Management Phases to include  manual lymphatyic drainage (MLD), skin care, therapeutic exercise, compression with short stretch wraps and garments, Pt edu for LE sef care    Consulted and Agree with Plan of Care Patient           Patient will benefit from skilled therapeutic intervention in order to improve the following deficits and impairments:   Body Structure / Function / Physical Skills: ADL, ROM, Scar mobility, IADL, Edema, Balance, Sensation, Skin integrity, Mobility, Flexibility, Strength, Decreased knowledge of  precautions, Gait, Pain, Decreased knowledge of use of DME       Visit Diagnosis: Lymphedema, not elsewhere classified    Problem List Patient Active Problem List   Diagnosis Date Noted  . Vitiligo 09/03/2019  . Port-A-Cath in place 08/20/2019  . Encounter for antineoplastic immunotherapy 08/06/2019  . Malignant melanoma of overlapping sites ([place]) 04/23/2019  . Goals of care, counseling/discussion 04/23/2019  . 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 Near syncope 12/17/2018  . Anemia in chronic kidney disease 12/11/2018  . Benign hypertensive kidney disease with chronic kidney disease 12/11/2018  . Stage 3a chronic kidney disease 12/11/2018  . AKI (acute kidney injury) ([place]) 08/24/2018  . Acute  hyperkalemia 08/24/2018  . HTN (hypertension) 08/24/2018  . HLD (hyperlipidemia) 08/24/2018  . Seizures ([place]) 08/24/2018  . Complete heart block ([place]) 08/24/2018  . Arthritis of wrist, left 06/12/2018  . Bradycardia 06/04/2018  . 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 Positive ANA (antinuclear antibody) 03/05/2018  . Swelling of joint of left wrist 03/05/2018  . Coronary artery disease involving native coronary artery of native heart 07/09/2017  . Palpitations 07/09/2017  . Stable angina ([place]) 06/27/2017  . Benign essential HTN 05/25/2017  . LBBB (left bundle branch block) 05/25/2017    Andrey Spearman, MS, OTR/L, [facility] 10/01/19 3:45 PM  [place] MAIN [facility] SERVICES [address] [place], [place], [address] Phone: [phone]   Fax:  [phone]  Name: Edward Pitts MRN: [id number] Date of Birth: [birth year]

## 2019-10-02 ENCOUNTER — Other Ambulatory Visit: Payer: Self-pay

## 2019-10-02 ENCOUNTER — Inpatient Hospital Stay

## 2019-10-02 ENCOUNTER — Inpatient Hospital Stay (HOSPITAL_BASED_OUTPATIENT_CLINIC_OR_DEPARTMENT_OTHER): Admitting: Oncology

## 2019-10-02 ENCOUNTER — Inpatient Hospital Stay: Attending: Oncology

## 2019-10-02 ENCOUNTER — Encounter: Payer: Self-pay | Admitting: Oncology

## 2019-10-02 VITALS — BP 128/63 | HR 66 | Temp 96.7°F | Resp 18 | Wt 225.3 lb

## 2019-10-02 DIAGNOSIS — C4359 Malignant melanoma of other part of trunk: Secondary | ICD-10-CM | POA: Insufficient documentation

## 2019-10-02 DIAGNOSIS — C438 Malignant melanoma of overlapping sites of skin: Secondary | ICD-10-CM | POA: Diagnosis not present

## 2019-10-02 DIAGNOSIS — I890 Lymphedema, not elsewhere classified: Secondary | ICD-10-CM | POA: Diagnosis not present

## 2019-10-02 DIAGNOSIS — Z5112 Encounter for antineoplastic immunotherapy: Secondary | ICD-10-CM

## 2019-10-02 DIAGNOSIS — L80 Vitiligo: Secondary | ICD-10-CM | POA: Insufficient documentation

## 2019-10-02 DIAGNOSIS — E875 Hyperkalemia: Secondary | ICD-10-CM | POA: Insufficient documentation

## 2019-10-02 DIAGNOSIS — I10 Essential (primary) hypertension: Secondary | ICD-10-CM | POA: Diagnosis not present

## 2019-10-02 DIAGNOSIS — M1712 Unilateral primary osteoarthritis, left knee: Secondary | ICD-10-CM

## 2019-10-02 DIAGNOSIS — D649 Anemia, unspecified: Secondary | ICD-10-CM | POA: Insufficient documentation

## 2019-10-02 DIAGNOSIS — Z95828 Presence of other vascular implants and grafts: Secondary | ICD-10-CM

## 2019-10-02 LAB — CBC WITH DIFFERENTIAL/PLATELET
Abs Immature Granulocytes: 0.04 10*3/uL (ref 0.00–0.07)
Basophils Absolute: 0 10*3/uL (ref 0.0–0.1)
Basophils Relative: 1 %
Eosinophils Absolute: 0.3 10*3/uL (ref 0.0–0.5)
Eosinophils Relative: 6 %
HCT: 35.7 % — ABNORMAL LOW (ref 39.0–52.0)
Hemoglobin: 12.2 g/dL — ABNORMAL LOW (ref 13.0–17.0)
Immature Granulocytes: 1 %
Lymphocytes Relative: 15 %
Lymphs Abs: 0.7 10*3/uL (ref 0.7–4.0)
MCH: 29.9 pg (ref 26.0–34.0)
MCHC: 34.2 g/dL (ref 30.0–36.0)
MCV: 87.5 fL (ref 80.0–100.0)
Monocytes Absolute: 0.4 10*3/uL (ref 0.1–1.0)
Monocytes Relative: 9 %
Neutro Abs: 3.2 10*3/uL (ref 1.7–7.7)
Neutrophils Relative %: 68 %
Platelets: 214 10*3/uL (ref 150–400)
RBC: 4.08 MIL/uL — ABNORMAL LOW (ref 4.22–5.81)
RDW: 12.9 % (ref 11.5–15.5)
WBC: 4.6 10*3/uL (ref 4.0–10.5)
nRBC: 0 % (ref 0.0–0.2)

## 2019-10-02 LAB — COMPREHENSIVE METABOLIC PANEL
ALT: 37 U/L (ref 0–44)
AST: 28 U/L (ref 15–41)
Albumin: 4.4 g/dL (ref 3.5–5.0)
Alkaline Phosphatase: 85 U/L (ref 38–126)
Anion gap: 10 (ref 5–15)
BUN: 29 mg/dL — ABNORMAL HIGH (ref 6–20)
CO2: 24 mmol/L (ref 22–32)
Calcium: 9.4 mg/dL (ref 8.9–10.3)
Chloride: 106 mmol/L (ref 98–111)
Creatinine, Ser: 1.37 mg/dL — ABNORMAL HIGH (ref 0.61–1.24)
GFR calc Af Amer: >60 mL/min (ref >60)
GFR calc non Af Amer: 56 mL/min — ABNORMAL LOW (ref >60)
Glucose, Bld: 117 mg/dL — ABNORMAL HIGH (ref 70–99)
Potassium: 4.9 mmol/L (ref 3.5–5.1)
Sodium: 140 mmol/L (ref 135–145)
Total Bilirubin: 0.7 mg/dL (ref 0.3–1.2)
Total Protein: 7.6 g/dL (ref 6.5–8.1)

## 2019-10-02 LAB — IRON AND TIBC
Iron: 85 ug/dL (ref 45–182)
Saturation Ratios: 25 % (ref 17.9–39.5)
TIBC: 342 ug/dL (ref 250–450)
UIBC: 257 ug/dL

## 2019-10-02 LAB — FERRITIN: Ferritin: 61 ng/mL (ref 24–336)

## 2019-10-02 MED ORDER — HEPARIN SOD (PORK) LOCK FLUSH 100 UNIT/ML IV SOLN
500.0000 [IU] | Freq: Once | INTRAVENOUS | Status: AC | PRN
  Administered 2019-10-02: 500 [IU]
  Filled 2019-10-02: qty 5

## 2019-10-02 MED ORDER — SODIUM CHLORIDE 0.9 % IV SOLN
240.0000 mg | Freq: Once | INTRAVENOUS | Status: AC
  Administered 2019-10-02: 240 mg via INTRAVENOUS
  Filled 2019-10-02: qty 24

## 2019-10-02 MED ORDER — SODIUM CHLORIDE 0.9 % IV SOLN
Freq: Once | INTRAVENOUS | Status: AC
  Filled 2019-10-02: qty 250

## 2019-10-02 MED ORDER — SODIUM CHLORIDE 0.9% FLUSH
10.0000 mL | INTRAVENOUS | Status: DC | PRN
  Administered 2019-10-02: 10 mL via INTRAVENOUS
  Filled 2019-10-02: qty 10

## 2019-10-02 NOTE — Progress Notes (Signed)
Patient has left knee pain 7/10 pain today and does get worse with rainy weather.  [facility].

## 2019-10-02 NOTE — Progress Notes (Signed)
Hematology/Oncology follow up  note [facility] [phone]) [phone] [phone]   Patient Care Team: [name], [name], MD as PCP - General (Student)  REFERRING PROVIDER: Cherylann Parr, MD  CHIEF COMPLAINTS/REASON FOR VISIT:  Follow up for melanoma HISTORY OF PRESENTING ILLNESS:   Edward Pitts is a  59 y.o.  male with PMH listed below was seen in consultation at the request of  [name], [name], MD  for evaluation of inguinal mass Patient presented to emergency room 3 days ago complaining about left ing uinal mass discomfort. Reports that he has really noticed the mass growing for the past 1 months. He has a history of left lower extremity melanoma in 2011, status post local excision.  Pain was increased with squatting of laxation. He was advised to take Tylenol for pain. Denies any fever, chills, night sweating.  He does feel mild nauseated. Appetite is fair.  He has lost about 10 pounds since earlier this year. 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  Left inguinal and left iliac nodes which are suspicious for involvement.  There are also 2 small nonspecific hypodense lesions within the right liver, nonspecific.  # patient underwent left groin mass resection On 04/16/2019. Resection pathology showed malignant melanoma, replacing a lymph node, with extracapsular extension, peripheral and deep margins involved.  Left inguinal contents, all 7 lymph nodes were negative for melanoma in the lymph nodes.  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  # PDL1 80% TPS  # 04/16/2019. underwent left groin mass resection   07/07/2019  Status post adjuvant radiation and finished radiation  Patient has Mediport placed to facilitate immunotherapy treatments. INTERVAL HISTORY Edward Pitts is a 59 y.o. male who has above history  reviewed by me today presents for follow up visit for management of inguinal nodal recurrence of melanoma Problems and complaints are listed below: Patient reports left knee pain which is a chronic problem for him. He started to have this problem when he was in service.  He is going to [place] for evaluation of possible knee replacement. He has no new other complaints. Does not use any NSAIDs.  He uses Tylenol as needed for pain.  . Review of Systems  Constitutional: Negative for appetite change, chills, fatigue, fever and unexpected weight change.  HENT:   Negative for hearing loss and voice change.   Eyes: Negative for eye problems and icterus.  Respiratory: Negative for chest tightness, cough and shortness of breath.   Cardiovascular: Negative for chest pain and leg swelling.  Gastrointestinal: Negative for abdominal distention and abdominal pain.  Endocrine: Negative for hot flashes.  Genitourinary: Negative for difficulty urinating, dysuria and frequency.   Musculoskeletal: Positive for arthralgias.  Skin: Negative for itching and rash.       Status post left groin mass resection. Skin hypo-pigmentation on upper extremities.  Neurological: Negative for light-headedness and numbness.  Hematological: Negative for adenopathy. Does not bruise/bleed easily.  Psychiatric/Behavioral: Negative for confusion.    MEDICAL HISTORY:  Past Medical History:  Diagnosis Date  . Anxiety   . Arthritis   . Complication of anesthesia   . Family history of adverse reaction to anesthesia    PONV mother  . Hyperlipidemia   . Hypertension   . Melanoma ([place]) 2012  . PONV (postoperative nausea and vomiting) 04/16/2019  . Presence of permanent cardiac pacemaker  [employer]  . Seizures ([place])     SURGICAL HISTORY: Past Surgical History:  Procedure Laterality Date  . KNEE SURGERY Left   . LEFT HEART CATH AND CORONARY ANGIOGRAPHY Left 06/29/2017   Procedure: LEFT HEART CATH AND CORONARY ANGIOGRAPHY;   Surgeon: [name], MD;  Location: [place] [facility];  Service: Cardiovascular;  Laterality: Left;  . LYMPH NODE DISSECTION Left 04/16/2019   Procedure: Left inguinal Lymph Node Dissection;  Surgeon: [name], MD;  Location: [place];  Service: General;  Laterality: Left;  Marland Kitchen MELANOMA EXCISION Left 04/16/2019   Procedure: MELANOMA EXCISION LEFT GROIN MASS;  Surgeon: [name], MD;  Location: [place];  Service: General;  Laterality: Left;  Marland Kitchen MELANOMA EXCISION WITH SENTINEL LYMPH NODE BIOPSY Left 2012   Left calf   . PACEMAKER INSERTION N/A 08/26/2018   Procedure: INSERTION PACEMAKER;  Surgeon: [name], MD;  Location: [facility];  Service: Cardiovascular;  Laterality: N/A;  . PORTA CATH INSERTION N/A 08/26/2019   Procedure: PORTA CATH INSERTION;  Surgeon: Katha Cabal, MD;  Location: [place] [facility];  Service: Cardiovascular;  Laterality: N/A;  . TEMPORARY PACEMAKER N/A 08/25/2018   Procedure: TEMPORARY PACEMAKER;  Surgeon: [name], MD;  Location: [place] [facility];  Service: Cardiovascular;  Laterality: N/A;    SOCIAL HISTORY: Social History   Socioeconomic History  . Marital status: Married    Spouse name: Vicente Males   . Number of children: 7  . Years of education: 12  . Highest education level: Not on file  Occupational History  . Occupation: [employer]   Tobacco Use  . Smoking status: Never Smoker  . Smokeless tobacco: Never Used  Vaping Use  . Vaping Use: Never used  Substance and Sexual Activity  . Alcohol use: No  . Drug use: No  . Sexual activity: Not on file  Other Topics Concern  . Not on file  Social History Narrative   Lives with mother, wife and sister   Caffeine use: sodas (2 per day)   Social Determinants of Health   Financial Resource Strain: Low Risk   . Difficulty of Paying Living Expenses: Not hard at all  Food Insecurity: No Food Insecurity  . Worried About Charity fundraiser in the Last Year: Never true  . Ran Out of  Food in the Last Year: Never true  Transportation Needs: Unmet Transportation Needs  . Lack of Transportation (Medical): Yes  . Lack of Transportation (Non-Medical): Yes  Physical Activity: Unknown  . Days of Exercise per Week: 0 days  . Minutes of Exercise per Session: Not on file  Stress: No Stress Concern Present  . Feeling of Stress : Only a little  Social Connections: Unknown  . Frequency of Communication with Friends and Family: More than three times a week  . Frequency of Social Gatherings with Friends and Family: Not on file  . Attends Religious Services: Not on file  . Active Member of Clubs or Organizations: Not on file  . Attends Archivist Meetings: Not on file  . Marital Status: Married  Human resources officer Violence: Not At Risk  . Fear of Current or Ex-Partner: No  . Emotionally Abused: No  . Physically Abused: No  . Sexually Abused: No    FAMILY HISTORY: Family History  Problem Relation Age of Onset  . Cancer Paternal Grandmother     ALLERGIES:  is allergic to ibuprofen and nsaids.  MEDICATIONS:  Current Outpatient Medications  Medication Sig Dispense  Refill  . acetaminophen (TYLENOL) 650 MG CR tablet Take 650 mg by mouth every 8 (eight) hours as needed for pain.     Marland Kitchen allopurinol (ZYLOPRIM) 300 MG tablet Take 300 mg by mouth daily.    Marland Kitchen aspirin 81 MG chewable tablet Chew 81 mg by mouth daily.    Marland Kitchen atorvastatin (LIPITOR) 20 MG tablet Take 20 mg by mouth daily.    . clonazePAM (KLONOPIN) 0.5 MG tablet 1 tablet in the morning, 2 in the evening (Patient taking differently: Take 0.5 mg by mouth daily. Takes 2 tabs as needed.) 90 tablet 3  . hydrocortisone 2.5 % cream Apply topically daily as needed.    Marland Kitchen ketoconazole (NIZORAL) 2 % cream Apply 1 application topically daily as needed for irritation.    . lidocaine-prilocaine (EMLA) cream Apply 1 application topically as needed. Apply small amount of cream to port site approx 1-2 hours prior to appointment. 30  g 2  . lisinopril (ZESTRIL) 20 MG tablet Take 20 mg by mouth daily.    . metoprolol succinate (TOPROL-XL) 25 MG 24 hr tablet Take 25 mg by mouth daily.     . Multiple Vitamin (MULTIVITAMIN WITH MINERALS) TABS tablet Take 1 tablet by mouth daily. Centrum Silver    . ondansetron (ZOFRAN) 8 MG tablet Take 1 tablet (8 mg total) by mouth every 8 (eight) hours as needed for nausea, vomiting or refractory nausea / vomiting. (Patient not taking: Reported on 08/21/2019) 30 tablet 0  . silver sulfADIAZINE (SILVADENE) 1 % cream Apply 1 application topically 2 (two) times daily. (Patient not taking: Reported on 08/06/2019) 50 g 2  . traMADol (ULTRAM) 50 MG tablet Take 1 tablet (50 mg total) by mouth every 6 (six) hours as needed. (Patient not taking: Reported on 08/19/2019) 30 tablet 0   No current facility-administered medications for this visit.   Facility-Administered Medications Ordered in Other Visits  Medication Dose Route Frequency Provider Last Rate Last Admin  . heparin lock flush 100 unit/mL  500 Units Intracatheter Once PRN [name], MD      . nivolumab (OPDIVO) 240 mg in sodium chloride 0.9 % 100 mL chemo infusion  240 mg Intravenous Once [name], MD      . sodium chloride flush (NS) 0.9 % injection 10 mL  10 mL Intravenous PRN [name], MD   10 mL at 10/02/19 0845     PHYSICAL EXAMINATION: ECOG PERFORMANCE STATUS: 0 - Asymptomatic Vitals:   10/02/19 0858  BP: 128/63  Pulse: 66  Resp: 18  Temp: (!) 96.7 F (35.9 C)   Filed Weights   10/02/19 0858  Weight: 225 lb 4.8 oz (102.2 kg)    Physical Exam Constitutional:      General: He is not in acute distress. HENT:     Head: Normocephalic and atraumatic.  Eyes:     General: No scleral icterus.    Pupils: Pupils are equal, round, and reactive to light.  Cardiovascular:     Rate and Rhythm: Normal rate and regular rhythm.     Heart sounds: Normal heart sounds.  Pulmonary:     Effort: Pulmonary effort is normal. No respiratory  distress.     Breath sounds: No wheezing.  Abdominal:     General: Bowel sounds are normal. There is no distension.     Palpations: Abdomen is soft. There is no mass.     Tenderness: There is no abdominal tenderness.     Comments:  left groin mass resection  and status post radiation.   Musculoskeletal:        General: No deformity. Normal range of motion.     Cervical back: Normal range of motion and neck supple.     Comments: Left lower extremity edema  Skin:    General: Skin is warm and dry.     Findings: No erythema or rash.  Neurological:     Mental Status: He is alert and oriented to person, place, and time. Mental status is at baseline.     Cranial Nerves: No cranial nerve deficit.     Coordination: Coordination normal.  Psychiatric:        Mood and Affect: Mood normal.    LABORATORY DATA:  I have reviewed the data as listed Lab Results  Component Value Date   WBC 4.6 10/02/2019   HGB 12.2 (L) 10/02/2019   HCT 35.7 (L) 10/02/2019   MCV 87.5 10/02/2019   PLT 214 10/02/2019   Recent Labs    09/03/19 0837 09/17/19 0839 10/02/19 0841  NA 141 140 140  K 4.0 4.3 4.9  CL 107 106 106  CO2 _0 GLUCOSE 104* 124* 117*  BUN 28* 25* 29*  CREATININE 1.24 1.14 1.37*  CALCIUM 9.1 9.1 9.4  GFRNONAA >60 >60 56*  GFRAA >60 >60 >60  PROT 7.2 7.1 7.6  ALBUMIN 4.4 4.2 4.4  AST 33 28 28  ALT 34 34 37  ALKPHOS 97 84 85  BILITOT 0.8 0.7 0.7   Iron/TIBC/Ferritin/ %Sat    Component Value Date/Time   IRON 59 08/20/2019 0829   TIBC 336 08/20/2019 0829   FERRITIN 55 08/20/2019 0829   IRONPCTSAT 18 08/20/2019 0829      RADIOGRAPHIC STUDIES: I have personally reviewed the radiological images as listed and agreed with the findings in the report. CT Chest W Contrast  Result Date: 09/10/2019 CLINICAL DATA:  Staging of malignant melanoma, status post left groin mass resection in January. On immunotherapy. Loose stools. Skin melanoma resection in 2012. EXAM: CT CHEST,  ABDOMEN, AND PELVIS WITH CONTRAST TECHNIQUE: Multidetector CT imaging of the chest, abdomen and pelvis was performed following the standard protocol during bolus administration of intravenous contrast. CONTRAST:  128m OMNIPAQUE IOHEXOL 300 MG/ML  SOLN COMPARISON:  01/31/2019 CTA of the abdomen and pelvis. PET of 02/11/2019. No prior dedicated chest CTs. FINDINGS: CT CHEST FINDINGS Cardiovascular: Pacer. Aortic atherosclerosis. Normal heart size, without pericardial effusion. No central pulmonary embolism, on this non-dedicated study. Lad coronary artery calcification. Mediastinum/Nodes: No supraclavicular adenopathy. 8 mm right-sided thyroid nodule. No supraclavicular adenopathy. No mediastinal or hilar adenopathy. Lungs/Pleura: No pleural fluid. No suspicious pulmonary nodule or mass. Musculoskeletal: No acute osseous abnormality. CT ABDOMEN PELVIS FINDINGS Hepatobiliary: Subcentimeter right hepatic lobe cyst. There is also a too small to characterize, 3-4 mm more inferior right hepatic lobe lesion on 60/2. Likely present on the prior PET. Normal gallbladder, without biliary ductal dilatation. Pancreas: Normal, without mass or ductal dilatation. Spleen: Normal in size, without focal abnormality. Adrenals/Urinary Tract: Normal adrenal glands. Lower pole right renal sinus cyst or minimally complex cyst of 1.9 cm. Upper pole right renal subcentimeter lesion is too small to characterize. No hydronephrosis. Normal urinary bladder. Stomach/Bowel: Normal stomach, without wall thickening. Normal colon and terminal ileum. Normal small bowel. Vascular/Lymphatic: Advanced aortic and branch vessel atherosclerosis. The previously described dominant left inguinal mass has been resected, with subcutaneous and intramuscular edema identified. The left external iliac nodes measure maximally 5 mm today on 107/2 versus 8  mm on the prior PET (when remeasured). No pelvic sidewall adenopathy. Reproductive: Normal prostate. Other: No  significant free fluid. Moderate size fat containing periumbilical ventral wall hernia. Musculoskeletal: Degenerative partial fusion of the right sacroiliac joint. IMPRESSION: 1. Interval resection of previously described left inguinal mass. The indeterminate left external iliac nodes on prior PET are decreased in size. 2. No evidence of new or progressive disease. 3. Coronary artery atherosclerosis. Aortic Atherosclerosis (ICD10-I70.0). 4. A too small to characterize right hepatic lobe low-density lesion is felt to be similar to on the prior PET, not hypermetabolic on that exam. Felt unlikely to represent metastasis but technically indeterminate. Recommend attention on follow-up. 5. 8 mm right thyroid nodule. Not clinically significant; no follow-up imaging recommended (ref: J Am Coll Radiol. 2015 Feb;12(2): 143-50). Electronically Signed   By: Abigail Miyamoto M.D.   On: 09/10/2019 11:08   CT Abdomen Pelvis W Contrast  Result Date: 09/10/2019 CLINICAL DATA:  Staging of malignant melanoma, status post left groin mass resection in January. On immunotherapy. Loose stools. Skin melanoma resection in 2012. EXAM: CT CHEST, ABDOMEN, AND PELVIS WITH CONTRAST TECHNIQUE: Multidetector CT imaging of the chest, abdomen and pelvis was performed following the standard protocol during bolus administration of intravenous contrast. CONTRAST:  156m OMNIPAQUE IOHEXOL 300 MG/ML  SOLN COMPARISON:  01/31/2019 CTA of the abdomen and pelvis. PET of 02/11/2019. No prior dedicated chest CTs. FINDINGS: CT CHEST FINDINGS Cardiovascular: Pacer. Aortic atherosclerosis. Normal heart size, without pericardial effusion. No central pulmonary embolism, on this non-dedicated study. Lad coronary artery calcification. Mediastinum/Nodes: No supraclavicular adenopathy. 8 mm right-sided thyroid nodule. No supraclavicular adenopathy. No mediastinal or hilar adenopathy. Lungs/Pleura: No pleural fluid. No suspicious pulmonary nodule or mass.  Musculoskeletal: No acute osseous abnormality. CT ABDOMEN PELVIS FINDINGS Hepatobiliary: Subcentimeter right hepatic lobe cyst. There is also a too small to characterize, 3-4 mm more inferior right hepatic lobe lesion on 60/2. Likely present on the prior PET. Normal gallbladder, without biliary ductal dilatation. Pancreas: Normal, without mass or ductal dilatation. Spleen: Normal in size, without focal abnormality. Adrenals/Urinary Tract: Normal adrenal glands. Lower pole right renal sinus cyst or minimally complex cyst of 1.9 cm. Upper pole right renal subcentimeter lesion is too small to characterize. No hydronephrosis. Normal urinary bladder. Stomach/Bowel: Normal stomach, without wall thickening. Normal colon and terminal ileum. Normal small bowel. Vascular/Lymphatic: Advanced aortic and branch vessel atherosclerosis. The previously described dominant left inguinal mass has been resected, with subcutaneous and intramuscular edema identified. The left external iliac nodes measure maximally 5 mm today on 107/2 versus 8 mm on the prior PET (when remeasured). No pelvic sidewall adenopathy. Reproductive: Normal prostate. Other: No significant free fluid. Moderate size fat containing periumbilical ventral wall hernia. Musculoskeletal: Degenerative partial fusion of the right sacroiliac joint. IMPRESSION: 1. Interval resection of previously described left inguinal mass. The indeterminate left external iliac nodes on prior PET are decreased in size. 2. No evidence of new or progressive disease. 3. Coronary artery atherosclerosis. Aortic Atherosclerosis (ICD10-I70.0). 4. A too small to characterize right hepatic lobe low-density lesion is felt to be similar to on the prior PET, not hypermetabolic on that exam. Felt unlikely to represent metastasis but technically indeterminate. Recommend attention on follow-up. 5. 8 mm right thyroid nodule. Not clinically significant; no follow-up imaging recommended (ref: J Am Coll  Radiol. 2015 Feb;12(2): 143-50). Electronically Signed   By: KAbigail MiyamotoM.D.   On: 09/10/2019 11:08   PERIPHERAL VASCULAR CATHETERIZATION  Result Date: 08/26/2019 See op note  ASSESSMENT & PLAN:  1. Malignant melanoma of overlapping sites ([place])   2. Vitiligo   3. Encounter for antineoplastic immunotherapy   4. Arthritis of left knee    #Left inguinal nodal recurrence of melanoma.  Status post resection and adjuvant radiation. Labs are reviewed and discussed with patient. Counts are stable to proceed with Nivolumab.   #Vitiligo, stable. Marland Kitchen  #Normocytic anemia, hemoglobin slightly improved to 12.2.  Iron panel showed adequate iron level.  # Left knee arthritis, [facility] pain[facility]  Recommend him to discuss with surgeon whether left inguinal lymph node dissection may affect his wound healing. He has lymphedema.  All questions were answered. The patient knows to call the clinic with any problems questions or concerns.  Follow-up with 2 weeks. We spent sufficient time to discuss many aspect of care, questions were answered to patient's satisfaction.   [name], MD, PhD Hematology Oncology [facility] Pager- [phone] 10/02/2019

## 2019-10-07 ENCOUNTER — Other Ambulatory Visit: Payer: Self-pay

## 2019-10-07 ENCOUNTER — Ambulatory Visit: Admitting: Occupational Therapy

## 2019-10-07 DIAGNOSIS — I890 Lymphedema, not elsewhere classified: Secondary | ICD-10-CM | POA: Diagnosis not present

## 2019-10-07 NOTE — Therapy (Signed)
[place] MAIN [facility] SERVICES [address] [place], [place], [address] Phone: [phone]   Fax:  [phone]  Occupational Therapy Treatment  Patient Details  Name: Edward Pitts MRN: [id number] Date of Birth: 05-Dec-1960 Referring Provider (OT): [name], MD   Encounter Date: 10/07/2019   OT End of Session - 10/07/19 1325    Visit Number 26    Number of Visits 36    Date for OT Re-Evaluation 10/22/19    OT Start Time 0914    OT Stop Time 1015    OT Time Calculation (min) 61 min    Activity Tolerance Patient tolerated treatment well;No increased pain    Behavior During Therapy WFL for tasks assessed/performed           Past Medical History:  Diagnosis Date  . Anxiety   . Arthritis   . Complication of anesthesia   . Family history of adverse reaction to anesthesia    PONV mother  . Hyperlipidemia   . Hypertension   . Melanoma ([place]) 2012  . PONV (postoperative nausea and vomiting) 04/16/2019  . Presence of permanent cardiac pacemaker    [employer]  . Seizures ([place])     Past Surgical History:  Procedure Laterality Date  . KNEE SURGERY Left   . LEFT HEART CATH AND CORONARY ANGIOGRAPHY Left 06/29/2017   Procedure: LEFT HEART CATH AND CORONARY ANGIOGRAPHY;  Surgeon: [name], MD;  Location: [place] [facility];  Service: Cardiovascular;  Laterality: Left;  . LYMPH NODE DISSECTION Left 04/16/2019   Procedure: Left inguinal Lymph Node Dissection;  Surgeon: [name], MD;  Location: [place];  Service: General;  Laterality: Left;  Marland Kitchen MELANOMA EXCISION Left 04/16/2019   Procedure: MELANOMA EXCISION LEFT GROIN MASS;  Surgeon: [name], MD;  Location: [place];  Service: General;  Laterality: Left;  Marland Kitchen MELANOMA EXCISION WITH SENTINEL LYMPH NODE BIOPSY Left 2012   Left calf   . PACEMAKER INSERTION N/A 08/26/2018   Procedure: INSERTION PACEMAKER;  Surgeon: [name], MD;  Location: [facility];  Service: Cardiovascular;   Laterality: N/A;  . PORTA CATH INSERTION N/A 08/26/2019   Procedure: PORTA CATH INSERTION;  Surgeon: Katha Cabal, MD;  Location: [place] [facility];  Service: Cardiovascular;  Laterality: N/A;  . TEMPORARY PACEMAKER N/A 08/25/2018   Procedure: TEMPORARY PACEMAKER;  Surgeon: [name], MD;  Location: [place] [facility];  Service: Cardiovascular;  Laterality: N/A;    There were no vitals filed for this visit.                 OT Treatments/Exercises (OP) - 10/07/19 0001      ADLs   ADL Education Given Yes      Manual Therapy   Manual Therapy Edema management;Manual Lymphatic Drainage (MLD);Compression Bandaging    Manual Lymphatic Drainage (MLD) MLDto LLE/LLQ in supine and sidelying utilizing short neck sequence, deep abdominals with diaphragmatic breathing, ipsilateral inguinal -axiillary anastomosis and proximal to distal leg sequence.     Compression Bandaging thigh length multi layer gradient compression wraps to LLE- short stretch                  OT Education - 10/07/19 1324    Education Details Continued skilled Pt/caregiver education  And LE ADL training throughout visit for lymphedema self care/ home program, including compression wrapping, compression garment and device wear/care, lymphatic pumping ther ex, simple self-MLD, and skin care. Discussed progress towards goals.    Person(s)  Educated Patient;Spouse    Methods Explanation;Demonstration;Handout    Comprehension Verbalized understanding;Returned demonstration;Need further instruction               OT Long Term Goals - 09/18/19 1303      OT LONG TERM GOAL #1   Title Pt will be able to apply thigh length, multi-layer, short stretch compression wraps daily to single limb using correct gradient techniques with Maximum assistance to achieve optimal limb volume reduction, to return affected limb/s, as closely as possible, to premorbid size and shape, to limit infection risk, and to  improve safe functional ambulation and mobility.    Status Revised   Pt unable to bend left knee to extent needed to appy compression garments independently. [place] is independent with gradient techniques.     OT LONG TERM GOAL #2   Title Pt will be able to verbalize signs and symptoms of cellulitis infection and identify at least 4 common lymphedema precautions using printed resource for reference to limit LE progression over time to limit risk of infection and LE exacerbation.    Status Achieved      OT LONG TERM GOAL #3   Status Achieved      OT LONG TERM GOAL #4   Status Achieved   achieved this date.. Goal met for LLE     OT LONG TERM GOAL #5   Status Achieved      OT LONG TERM GOAL #6   Status On-going                 Plan - 10/07/19 1329    Clinical Impression Statement Pt performed short neck sequence with moderate assistance. MLD to LLE and LLQ directing flow towards deep abdominal pathways and ipsilateral inguinal - axillary anastamosis in sidely9ing. . Emphasis of manual  therapy on proximal decongestion. Pt tolerated and enged in  all aspects of OT for LE care today. Cont as per POC.    OT Occupational Profile and History Comprehensive Assessment- Review of records and extensive additional review of physical, cognitive, psychosocial history related to current functional performance    Occupational performance deficits (Please refer to evaluation for details): ADL's;IADL's;Rest and Sleep;Work;Leisure;Social Participation;Other   body image, role performance   Body Structure / Function / Physical Skills ADL;ROM;Scar mobility;IADL;Edema;Balance;Sensation;Skin integrity;Mobility;Flexibility;Strength;Decreased knowledge of precautions;Gait;Pain;Decreased knowledge of use of DME    Rehab Potential Good    Clinical Decision Making Several treatment options, min-mod task modification necessary    Comorbidities Affecting Occupational Performance: Presence of comorbidities  impacting occupational performance    Modification or Assistance to Complete Evaluation  Min-Moderate modification of tasks or assist with assess necessary to complete eval    OT Frequency 2x / week    OT Duration 12 weeks   and PRN   OT Treatment/Interventions Self-care/ADL training;Therapeutic exercise;Functional Mobility Training;Manual Therapy;Coping strategies training;Therapeutic activities;Manual lymph drainage;Energy conservation;DME and/or AE instruction;Compression bandaging;Other (comment);Scar mobilization;Patient/family education   fit with appropriate compression garments once swelling is reduced   Plan Complete Decongestive Therapy (CDT), Intensive and Management Phases to include  manual lymphatyic drainage (MLD), skin care, therapeutic exercise, compression with short stretch wraps and garments, Pt edu for LE sef care    Consulted and Agree with Plan of Care Patient           Patient will benefit from skilled therapeutic intervention in order to improve the following deficits and impairments:   Body Structure / Function / Physical Skills: ADL, ROM, Scar mobility, IADL, Edema, Balance, Sensation, Skin  integrity, Mobility, Flexibility, Strength, Decreased knowledge of precautions, Gait, Pain, Decreased knowledge of use of DME       Visit Diagnosis: Lymphedema, not elsewhere classified    Problem List Patient Active Problem List   Diagnosis Date Noted  . Vitiligo 09/03/2019  . Port-A-Cath in place 08/20/2019  . Encounter for antineoplastic immunotherapy 08/06/2019  . Malignant melanoma of overlapping sites ([place]) 04/23/2019  . Goals of care, counseling/discussion 04/23/2019  . 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 Near syncope 12/17/2018  . Anemia in chronic kidney disease 12/11/2018  . Benign hypertensive kidney disease with chronic kidney disease 12/11/2018  . Stage 3a chronic kidney disease 12/11/2018  . AKI (acute kidney injury) ([place])  08/24/2018  . Acute hyperkalemia 08/24/2018  . HTN (hypertension) 08/24/2018  . HLD (hyperlipidemia) 08/24/2018  . Seizures ([place]) 08/24/2018  . Complete heart block ([place]) 08/24/2018  . Arthritis of wrist, left 06/12/2018  . Bradycardia 06/04/2018  . 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 Positive ANA (antinuclear antibody) 03/05/2018  . Swelling of joint of left wrist 03/05/2018  . Coronary artery disease involving native coronary artery of native heart 07/09/2017  . Palpitations 07/09/2017  . Stable angina ([place]) 06/27/2017  . Benign essential HTN 05/25/2017  . LBBB (left bundle branch block) 05/25/2017    Andrey Spearman, MS, OTR/L, [facility] 10/07/19 1:58 PM   [place] [facility] MAIN [facility] SERVICES [address] [place], [place], [address] Phone: [phone]   Fax:  [phone]  Name: Edward Pitts MRN: [id number] Date of Birth: Aug 08, 1960

## 2019-10-08 ENCOUNTER — Other Ambulatory Visit: Payer: Self-pay

## 2019-10-08 ENCOUNTER — Ambulatory Visit: Admitting: Occupational Therapy

## 2019-10-08 DIAGNOSIS — I890 Lymphedema, not elsewhere classified: Secondary | ICD-10-CM

## 2019-10-08 NOTE — Therapy (Signed)
[place] MAIN [facility] SERVICES [address] [place], [place], [address] Phone: [phone]   Fax:  [phone]  Patient Details  Name: Stanlee Roehrig MRN: [id number] Date of Birth: [birth year] Referring Provider:  [name], MD  Encounter Date: 10/08/2019  Andrey Spearman, MS, OTR/L, [facility] 10/08/19 4:19 PM   [place] MAIN [facility] SERVICES [address] [place], [place], [address] Phone: [phone]   Fax:  [phone]

## 2019-10-09 ENCOUNTER — Ambulatory Visit: Admitting: Occupational Therapy

## 2019-10-09 ENCOUNTER — Telehealth: Payer: Self-pay | Admitting: *Deleted

## 2019-10-09 NOTE — Telephone Encounter (Signed)
Patient called reporting that he has lost his insurance and needs to stop his treatment until he can get insurance figured out hopefully by the end of the month.

## 2019-10-09 NOTE — Telephone Encounter (Signed)
Checking with our [place] social worker, Chrystal P (p/a advocate), and our insurance dept to see if there is anything that could help patient during this time.

## 2019-10-10 ENCOUNTER — Encounter: Payer: BC Managed Care – PPO | Admitting: Occupational Therapy

## 2019-10-10 NOTE — Telephone Encounter (Signed)
Dr. Tasia Catchings, patient would like hold off on the July treatment and get treatment when the insurance goes in effect August.

## 2019-10-13 NOTE — Telephone Encounter (Signed)
Tried calling patient earlier to let him know MD recommendation and there was no answer or no voicemail.  Tried calling multiple times this afternoon and get an automatic message that the "call can not be completed at this time".

## 2019-10-13 NOTE — Telephone Encounter (Signed)
Dr. Tasia Catchings prefers patient NOT postpone his treatment.  I have notified Chrystal P and she will get MD to sign forms for medication pt assistance.

## 2019-10-14 ENCOUNTER — Encounter: Payer: BC Managed Care – PPO | Admitting: Occupational Therapy

## 2019-10-14 NOTE — Telephone Encounter (Signed)
Patient informed about Dr. Collie Siad recommendation to continue with next treatment.  He agrees and will keep appts as scheduled.

## 2019-10-15 ENCOUNTER — Ambulatory Visit: Admitting: Occupational Therapy

## 2019-10-16 ENCOUNTER — Inpatient Hospital Stay

## 2019-10-16 ENCOUNTER — Encounter: Payer: Self-pay | Admitting: Oncology

## 2019-10-16 ENCOUNTER — Other Ambulatory Visit: Payer: Self-pay

## 2019-10-16 ENCOUNTER — Inpatient Hospital Stay (HOSPITAL_BASED_OUTPATIENT_CLINIC_OR_DEPARTMENT_OTHER): Admitting: Oncology

## 2019-10-16 VITALS — BP 124/77 | HR 81 | Temp 96.4°F | Resp 18 | Wt 227.4 lb

## 2019-10-16 DIAGNOSIS — M1712 Unilateral primary osteoarthritis, left knee: Secondary | ICD-10-CM

## 2019-10-16 DIAGNOSIS — L80 Vitiligo: Secondary | ICD-10-CM | POA: Diagnosis not present

## 2019-10-16 DIAGNOSIS — E875 Hyperkalemia: Secondary | ICD-10-CM

## 2019-10-16 DIAGNOSIS — Z5112 Encounter for antineoplastic immunotherapy: Secondary | ICD-10-CM | POA: Diagnosis not present

## 2019-10-16 DIAGNOSIS — C438 Malignant melanoma of overlapping sites of skin: Secondary | ICD-10-CM

## 2019-10-16 LAB — COMPREHENSIVE METABOLIC PANEL
ALT: 28 U/L (ref 0–44)
AST: 26 U/L (ref 15–41)
Albumin: 4.3 g/dL (ref 3.5–5.0)
Alkaline Phosphatase: 85 U/L (ref 38–126)
Anion gap: 6 (ref 5–15)
BUN: 28 mg/dL — ABNORMAL HIGH (ref 6–20)
CO2: 25 mmol/L (ref 22–32)
Calcium: 9.3 mg/dL (ref 8.9–10.3)
Chloride: 107 mmol/L (ref 98–111)
Creatinine, Ser: 1.36 mg/dL — ABNORMAL HIGH (ref 0.61–1.24)
GFR calc Af Amer: >60 mL/min (ref >60)
GFR calc non Af Amer: 56 mL/min — ABNORMAL LOW (ref >60)
Glucose, Bld: 110 mg/dL — ABNORMAL HIGH (ref 70–99)
Potassium: 5.2 mmol/L — ABNORMAL HIGH (ref 3.5–5.1)
Sodium: 138 mmol/L (ref 135–145)
Total Bilirubin: 0.7 mg/dL (ref 0.3–1.2)
Total Protein: 7.2 g/dL (ref 6.5–8.1)

## 2019-10-16 LAB — CBC WITH DIFFERENTIAL/PLATELET
Abs Immature Granulocytes: 0.02 10*3/uL (ref 0.00–0.07)
Basophils Absolute: 0 10*3/uL (ref 0.0–0.1)
Basophils Relative: 0 %
Eosinophils Absolute: 0.3 10*3/uL (ref 0.0–0.5)
Eosinophils Relative: 6 %
HCT: 33.9 % — ABNORMAL LOW (ref 39.0–52.0)
Hemoglobin: 11.5 g/dL — ABNORMAL LOW (ref 13.0–17.0)
Immature Granulocytes: 0 %
Lymphocytes Relative: 13 %
Lymphs Abs: 0.7 10*3/uL (ref 0.7–4.0)
MCH: 30.1 pg (ref 26.0–34.0)
MCHC: 33.9 g/dL (ref 30.0–36.0)
MCV: 88.7 fL (ref 80.0–100.0)
Monocytes Absolute: 0.5 10*3/uL (ref 0.1–1.0)
Monocytes Relative: 10 %
Neutro Abs: 3.7 10*3/uL (ref 1.7–7.7)
Neutrophils Relative %: 71 %
Platelets: 211 10*3/uL (ref 150–400)
RBC: 3.82 MIL/uL — ABNORMAL LOW (ref 4.22–5.81)
RDW: 13.1 % (ref 11.5–15.5)
WBC: 5.2 10*3/uL (ref 4.0–10.5)
nRBC: 0 % (ref 0.0–0.2)

## 2019-10-16 MED ORDER — HEPARIN SOD (PORK) LOCK FLUSH 100 UNIT/ML IV SOLN
INTRAVENOUS | Status: AC
  Filled 2019-10-16: qty 5

## 2019-10-16 MED ORDER — SODIUM CHLORIDE 0.9 % IV SOLN
240.0000 mg | Freq: Once | INTRAVENOUS | Status: AC
  Administered 2019-10-16: 240 mg via INTRAVENOUS
  Filled 2019-10-16: qty 24

## 2019-10-16 MED ORDER — HEPARIN SOD (PORK) LOCK FLUSH 100 UNIT/ML IV SOLN
500.0000 [IU] | Freq: Once | INTRAVENOUS | Status: AC | PRN
  Administered 2019-10-16: 500 [IU]
  Filled 2019-10-16: qty 5

## 2019-10-16 MED ORDER — SODIUM CHLORIDE 0.9 % IV SOLN
Freq: Once | INTRAVENOUS | Status: AC
  Filled 2019-10-16: qty 250

## 2019-10-16 NOTE — Progress Notes (Signed)
Pt here for follow up. No new concerns voiced.   

## 2019-10-16 NOTE — Progress Notes (Signed)
Hematology/Oncology follow up  note [facility] [phone] [phone]   Patient Care Team: [name], [name], MD as PCP - General (Student)  REFERRING PROVIDER: Cherylann Parr, MD  CHIEF COMPLAINTS/REASON FOR VISIT:  Follow up for melanoma HISTORY OF PRESENTING ILLNESS:   Edward Pitts is a  59 y.o.  male with PMH listed below was seen in consultation at the request of  [name], [name], MD  for evaluation of inguinal mass Patient presented to emergency room 3 days ago complaining about left ing uinal mass discomfort. Reports that he has really noticed the mass growing for the past 1 months. He has a history of left lower extremity melanoma in 2011, status post local excision.  Pain was increased with squatting of laxation. He was advised to take Tylenol for pain. Denies any fever, chills, night sweating.  He does feel mild nauseated. Appetite is fair.  He has lost about 10 pounds since earlier this year. 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  Left inguinal and left iliac nodes which are suspicious for involvement.  There are also 2 small nonspecific hypodense lesions within the right liver, nonspecific.  # patient underwent left groin mass resection On 04/16/2019. Resection pathology showed malignant melanoma, replacing a lymph node, with extracapsular extension, peripheral and deep margins involved.  Left inguinal contents, all 7 lymph nodes were negative for melanoma in the lymph nodes.  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  # PDL1 80% TPS  # 04/16/2019. underwent left groin mass resection   07/07/2019  Status post adjuvant radiation and finished radiation  Patient has Mediport placed to facilitate immunotherapy treatments. INTERVAL HISTORY Edward Pitts is a 59 y.o. male who has above history  reviewed by me today presents for follow up visit for management of inguinal nodal recurrence of melanoma Problems and complaints are listed below: Left knee arthritis, patient is getting evaluation at the [place]. He has no new complaints.  . Review of Systems  Constitutional: Negative for appetite change, chills, fatigue, fever and unexpected weight change.  HENT:   Negative for hearing loss and voice change.   Eyes: Negative for eye problems and icterus.  Respiratory: Negative for chest tightness, cough and shortness of breath.   Cardiovascular: Negative for chest pain and leg swelling.  Gastrointestinal: Negative for abdominal distention and abdominal pain.  Endocrine: Negative for hot flashes.  Genitourinary: Negative for difficulty urinating, dysuria and frequency.   Musculoskeletal: Positive for arthralgias.  Skin: Negative for itching and rash.       Status post left groin mass resection. Skin hypo-pigmentation on upper extremities.  Neurological: Negative for light-headedness and numbness.  Hematological: Negative for adenopathy. Does not bruise/bleed easily.  Psychiatric/Behavioral: Negative for confusion.    MEDICAL HISTORY:  Past Medical History:  Diagnosis Date  . Anxiety   . Arthritis   . Complication of anesthesia   . Family history of adverse reaction to anesthesia    PONV mother  . Hyperlipidemia   . Hypertension   . Melanoma ([place]) 2012  . PONV (postoperative nausea and vomiting) 04/16/2019  . Presence of permanent cardiac pacemaker    [employer]  . Seizures ([place])     SURGICAL HISTORY: Past Surgical History:  Procedure Laterality Date  . KNEE SURGERY Left   . LEFT HEART CATH AND CORONARY ANGIOGRAPHY Left 06/29/2017   Procedure: LEFT  HEART CATH AND CORONARY ANGIOGRAPHY;  Surgeon: [name], MD;  Location: [facility];  Service: Cardiovascular;  Laterality: Left;  . LYMPH NODE DISSECTION Left 04/16/2019   Procedure: Left inguinal Lymph Node  Dissection;  Surgeon: [name], MD;  Location: [facility];  Service: General;  Laterality: Left;  Marland Kitchen MELANOMA EXCISION Left 04/16/2019   Procedure: MELANOMA EXCISION LEFT GROIN MASS;  Surgeon: [name], MD;  Location: [facility];  Service: General;  Laterality: Left;  Marland Kitchen MELANOMA EXCISION WITH SENTINEL LYMPH NODE BIOPSY Left 2012   Left calf   . PACEMAKER INSERTION N/A 08/26/2018   Procedure: INSERTION PACEMAKER;  Surgeon: [name], MD;  Location: [facility];  Service: Cardiovascular;  Laterality: N/A;  . PORTA CATH INSERTION N/A 08/26/2019   Procedure: PORTA CATH INSERTION;  Surgeon: Katha Cabal, MD;  Location: [facility] [facility];  Service: Cardiovascular;  Laterality: N/A;  . TEMPORARY PACEMAKER N/A 08/25/2018   Procedure: TEMPORARY PACEMAKER;  Surgeon: [name], MD;  Location: [place] [facility];  Service: Cardiovascular;  Laterality: N/A;    SOCIAL HISTORY: Social History   Socioeconomic History  . Marital status: Married    Spouse name: Vicente Males   . Number of children: 7  . Years of education: 89  . Highest education level: Not on file  Occupational History  . Occupation: [employer]   Tobacco Use  . Smoking status: Never Smoker  . Smokeless tobacco: Never Used  Vaping Use  . Vaping Use: Never used  Substance and Sexual Activity  . Alcohol use: No  . Drug use: No  . Sexual activity: Not on file  Other Topics Concern  . Not on file  Social History Narrative   Lives with mother, wife and sister   Caffeine use: sodas (2 per day)   Social Determinants of Health   Financial Resource Strain: Low Risk   . Difficulty of Paying Living Expenses: Not hard at all  Food Insecurity: No Food Insecurity  . Worried About Charity fundraiser in the Last Year: Never true  . Ran Out of Food in the Last Year: Never true  Transportation Needs: Unmet Transportation Needs  . Lack of Transportation (Medical): Yes  . Lack of Transportation (Non-Medical): Yes  Physical Activity:  Unknown  . Days of Exercise per Week: 0 days  . Minutes of Exercise per Session: Not on file  Stress: No Stress Concern Present  . Feeling of Stress : Only a little  Social Connections: Unknown  . Frequency of Communication with Friends and Family: More than three times a week  . Frequency of Social Gatherings with Friends and Family: Not on file  . Attends Religious Services: Not on file  . Active Member of Clubs or Organizations: Not on file  . Attends Archivist Meetings: Not on file  . Marital Status: Married  Human resources officer Violence: Not At Risk  . Fear of Current or Ex-Partner: No  . Emotionally Abused: No  . Physically Abused: No  . Sexually Abused: No    FAMILY HISTORY: Family History  Problem Relation Age of Onset  . Cancer Paternal Grandmother     ALLERGIES:  is allergic to ibuprofen and nsaids.  MEDICATIONS:  Current Outpatient Medications  Medication Sig Dispense Refill  . acetaminophen (TYLENOL) 650 MG CR tablet Take 650 mg by mouth every 8 (eight) hours as needed for pain.     Marland Kitchen allopurinol (ZYLOPRIM) 300 MG tablet Take 300 mg by mouth daily.    Marland Kitchen  aspirin 81 MG chewable tablet Chew 81 mg by mouth daily.    Marland Kitchen atorvastatin (LIPITOR) 20 MG tablet Take 20 mg by mouth daily.    . clonazePAM (KLONOPIN) 0.5 MG tablet 1 tablet in the morning, 2 in the evening (Patient taking differently: Take 0.5 mg by mouth daily. Takes 2 tabs as needed.) 90 tablet 3  . hydrocortisone 2.5 % cream Apply topically daily as needed.    Marland Kitchen ketoconazole (NIZORAL) 2 % cream Apply 1 application topically daily as needed for irritation.    . lidocaine-prilocaine (EMLA) cream Apply 1 application topically as needed. Apply small amount of cream to port site approx 1-2 hours prior to appointment. 30 g 2  . lisinopril (ZESTRIL) 20 MG tablet Take 20 mg by mouth daily.    . metoprolol succinate (TOPROL-XL) 25 MG 24 hr tablet Take 25 mg by mouth daily.     . Multiple Vitamin (MULTIVITAMIN  WITH MINERALS) TABS tablet Take 1 tablet by mouth daily. Centrum Silver    . ondansetron (ZOFRAN) 8 MG tablet Take 1 tablet (8 mg total) by mouth every 8 (eight) hours as needed for nausea, vomiting or refractory nausea / vomiting. (Patient not taking: Reported on 08/21/2019) 30 tablet 0  . silver sulfADIAZINE (SILVADENE) 1 % cream Apply 1 application topically 2 (two) times daily. (Patient not taking: Reported on 08/06/2019) 50 g 2  . traMADol (ULTRAM) 50 MG tablet Take 1 tablet (50 mg total) by mouth every 6 (six) hours as needed. (Patient not taking: Reported on 08/19/2019) 30 tablet 0   No current facility-administered medications for this visit.     PHYSICAL EXAMINATION: ECOG PERFORMANCE STATUS: 0 - Asymptomatic Vitals:   10/16/19 0852  BP: 124/77  Pulse: 81  Resp: 18  Temp: (!) 96.4 F (35.8 C)   Filed Weights   10/16/19 0852  Weight: 227 lb 6.4 oz (103.1 kg)    Physical Exam Constitutional:      General: He is not in acute distress. HENT:     Head: Normocephalic and atraumatic.  Eyes:     General: No scleral icterus.    Pupils: Pupils are equal, round, and reactive to light.  Cardiovascular:     Rate and Rhythm: Normal rate and regular rhythm.     Heart sounds: Normal heart sounds.  Pulmonary:     Effort: Pulmonary effort is normal. No respiratory distress.     Breath sounds: No wheezing.  Abdominal:     General: Bowel sounds are normal. There is no distension.     Palpations: Abdomen is soft. There is no mass.     Tenderness: There is no abdominal tenderness.     Comments:  left groin mass resection and status post radiation.   Musculoskeletal:        General: No deformity. Normal range of motion.     Cervical back: Normal range of motion and neck supple.     Comments: Left lower extremity edema  Skin:    General: Skin is warm and dry.     Findings: No erythema or rash.  Neurological:     Mental Status: He is alert and oriented to person, place, and time.  Mental status is at baseline.     Cranial Nerves: No cranial nerve deficit.     Coordination: Coordination normal.  Psychiatric:        Mood and Affect: Mood normal.    LABORATORY DATA:  I have reviewed the data as listed Lab Results  Component Value Date   WBC 4.6 10/02/2019   HGB 12.2 (L) 10/02/2019   HCT 35.7 (L) 10/02/2019   MCV 87.5 10/02/2019   PLT 214 10/02/2019   Recent Labs    09/03/19 0837 09/17/19 0839 10/02/19 0841  NA 141 140 140  K 4.0 4.3 4.9  CL 107 106 106  CO2 _0 GLUCOSE 104* 124* 117*  BUN 28* 25* 29*  CREATININE 1.24 1.14 1.37*  CALCIUM 9.1 9.1 9.4  GFRNONAA >60 >60 56*  GFRAA >60 >60 >60  PROT 7.2 7.1 7.6  ALBUMIN 4.4 4.2 4.4  AST 33 28 28  ALT 34 34 37  ALKPHOS 97 84 85  BILITOT 0.8 0.7 0.7   Iron/TIBC/Ferritin/ %Sat    Component Value Date/Time   IRON 85 10/02/2019 0841   TIBC 342 10/02/2019 0841   FERRITIN 61 10/02/2019 0841   IRONPCTSAT 25 10/02/2019 0841      RADIOGRAPHIC STUDIES: I have personally reviewed the radiological images as listed and agreed with the findings in the report. CT Chest W Contrast  Result Date: 09/10/2019 CLINICAL DATA:  Staging of malignant melanoma, status post left groin mass resection in January. On immunotherapy. Loose stools. Skin melanoma resection in 2012. EXAM: CT CHEST, ABDOMEN, AND PELVIS WITH CONTRAST TECHNIQUE: Multidetector CT imaging of the chest, abdomen and pelvis was performed following the standard protocol during bolus administration of intravenous contrast. CONTRAST:  178m OMNIPAQUE IOHEXOL 300 MG/ML  SOLN COMPARISON:  01/31/2019 CTA of the abdomen and pelvis. PET of 02/11/2019. No prior dedicated chest CTs. FINDINGS: CT CHEST FINDINGS Cardiovascular: Pacer. Aortic atherosclerosis. Normal heart size, without pericardial effusion. No central pulmonary embolism, on this non-dedicated study. Lad coronary artery calcification. Mediastinum/Nodes: No supraclavicular adenopathy. 8 mm  right-sided thyroid nodule. No supraclavicular adenopathy. No mediastinal or hilar adenopathy. Lungs/Pleura: No pleural fluid. No suspicious pulmonary nodule or mass. Musculoskeletal: No acute osseous abnormality. CT ABDOMEN PELVIS FINDINGS Hepatobiliary: Subcentimeter right hepatic lobe cyst. There is also a too small to characterize, 3-4 mm more inferior right hepatic lobe lesion on 60/2. Likely present on the prior PET. Normal gallbladder, without biliary ductal dilatation. Pancreas: Normal, without mass or ductal dilatation. Spleen: Normal in size, without focal abnormality. Adrenals/Urinary Tract: Normal adrenal glands. Lower pole right renal sinus cyst or minimally complex cyst of 1.9 cm. Upper pole right renal subcentimeter lesion is too small to characterize. No hydronephrosis. Normal urinary bladder. Stomach/Bowel: Normal stomach, without wall thickening. Normal colon and terminal ileum. Normal small bowel. Vascular/Lymphatic: Advanced aortic and branch vessel atherosclerosis. The previously described dominant left inguinal mass has been resected, with subcutaneous and intramuscular edema identified. The left external iliac nodes measure maximally 5 mm today on 107/2 versus 8 mm on the prior PET (when remeasured). No pelvic sidewall adenopathy. Reproductive: Normal prostate. Other: No significant free fluid. Moderate size fat containing periumbilical ventral wall hernia. Musculoskeletal: Degenerative partial fusion of the right sacroiliac joint. IMPRESSION: 1. Interval resection of previously described left inguinal mass. The indeterminate left external iliac nodes on prior PET are decreased in size. 2. No evidence of new or progressive disease. 3. Coronary artery atherosclerosis. Aortic Atherosclerosis (ICD10-I70.0). 4. A too small to characterize right hepatic lobe low-density lesion is felt to be similar to on the prior PET, not hypermetabolic on that exam. Felt unlikely to represent metastasis but  technically indeterminate. Recommend attention on follow-up. 5. 8 mm right thyroid nodule. Not clinically significant; no follow-up imaging recommended (ref: J Am Coll Radiol. 2015 Feb;12(2):  143-50). Electronically Signed   By: Abigail Miyamoto M.D.   On: 09/10/2019 11:08   CT Abdomen Pelvis W Contrast  Result Date: 09/10/2019 CLINICAL DATA:  Staging of malignant melanoma, status post left groin mass resection in January. On immunotherapy. Loose stools. Skin melanoma resection in 2012. EXAM: CT CHEST, ABDOMEN, AND PELVIS WITH CONTRAST TECHNIQUE: Multidetector CT imaging of the chest, abdomen and pelvis was performed following the standard protocol during bolus administration of intravenous contrast. CONTRAST:  167m OMNIPAQUE IOHEXOL 300 MG/ML  SOLN COMPARISON:  01/31/2019 CTA of the abdomen and pelvis. PET of 02/11/2019. No prior dedicated chest CTs. FINDINGS: CT CHEST FINDINGS Cardiovascular: Pacer. Aortic atherosclerosis. Normal heart size, without pericardial effusion. No central pulmonary embolism, on this non-dedicated study. Lad coronary artery calcification. Mediastinum/Nodes: No supraclavicular adenopathy. 8 mm right-sided thyroid nodule. No supraclavicular adenopathy. No mediastinal or hilar adenopathy. Lungs/Pleura: No pleural fluid. No suspicious pulmonary nodule or mass. Musculoskeletal: No acute osseous abnormality. CT ABDOMEN PELVIS FINDINGS Hepatobiliary: Subcentimeter right hepatic lobe cyst. There is also a too small to characterize, 3-4 mm more inferior right hepatic lobe lesion on 60/2. Likely present on the prior PET. Normal gallbladder, without biliary ductal dilatation. Pancreas: Normal, without mass or ductal dilatation. Spleen: Normal in size, without focal abnormality. Adrenals/Urinary Tract: Normal adrenal glands. Lower pole right renal sinus cyst or minimally complex cyst of 1.9 cm. Upper pole right renal subcentimeter lesion is too small to characterize. No hydronephrosis. Normal  urinary bladder. Stomach/Bowel: Normal stomach, without wall thickening. Normal colon and terminal ileum. Normal small bowel. Vascular/Lymphatic: Advanced aortic and branch vessel atherosclerosis. The previously described dominant left inguinal mass has been resected, with subcutaneous and intramuscular edema identified. The left external iliac nodes measure maximally 5 mm today on 107/2 versus 8 mm on the prior PET (when remeasured). No pelvic sidewall adenopathy. Reproductive: Normal prostate. Other: No significant free fluid. Moderate size fat containing periumbilical ventral wall hernia. Musculoskeletal: Degenerative partial fusion of the right sacroiliac joint. IMPRESSION: 1. Interval resection of previously described left inguinal mass. The indeterminate left external iliac nodes on prior PET are decreased in size. 2. No evidence of new or progressive disease. 3. Coronary artery atherosclerosis. Aortic Atherosclerosis (ICD10-I70.0). 4. A too small to characterize right hepatic lobe low-density lesion is felt to be similar to on the prior PET, not hypermetabolic on that exam. Felt unlikely to represent metastasis but technically indeterminate. Recommend attention on follow-up. 5. 8 mm right thyroid nodule. Not clinically significant; no follow-up imaging recommended (ref: J Am Coll Radiol. 2015 Feb;12(2): 143-50). Electronically Signed   By: KAbigail MiyamotoM.D.   On: 09/10/2019 11:08   PERIPHERAL VASCULAR CATHETERIZATION  Result Date: 08/26/2019 See op note      ASSESSMENT & PLAN:  1. Malignant melanoma of [place] sites [place]   2. Vitiligo   3. Encounter for antineoplastic immunotherapy   4. Arthritis of left knee   5. Hyperkalemia    #Left inguinal nodal recurrence of melanoma.  Status post resection and adjuvant radiation. Labs are reviewed and discussed with patient. Counts acceptable to proceed with nivolumab.  #Vitiligo, stable. . # Hyperkalemia, K is 5.2, recommend patient to  restrict potassium enriched food. Also advise patient to hold lisinopril and discuss with PCP. I printed a copy of his lab result so he can provide to pcp.  #Normocytic anemia, hemoglobin stable.  Continue to monitor. Iron panel showed adequate iron level.  # Left knee arthritis, knee pain, [facility]  Recommend him to discuss with surgeon whether left inguinal lymph node dissection may affect his wound healing. He has lymphedema.  All questions were answered. The patient knows to call the clinic with any problems questions or concerns.  Follow-up with 2 weeks. We spent sufficient time to discuss many aspect of care, questions were answered to patient's satisfaction.   [name], MD, PhD Hematology Oncology [facility] Pager- [phone] 10/16/2019

## 2019-10-17 ENCOUNTER — Encounter: Payer: BC Managed Care – PPO | Admitting: Occupational Therapy

## 2019-10-21 ENCOUNTER — Encounter: Payer: BC Managed Care – PPO | Admitting: Occupational Therapy

## 2019-10-22 ENCOUNTER — Ambulatory Visit: Admitting: Occupational Therapy

## 2019-10-24 ENCOUNTER — Encounter: Payer: BC Managed Care – PPO | Admitting: Occupational Therapy

## 2019-10-30 ENCOUNTER — Other Ambulatory Visit: Payer: Self-pay

## 2019-10-30 ENCOUNTER — Inpatient Hospital Stay: Payer: 59 | Attending: Oncology

## 2019-10-30 ENCOUNTER — Inpatient Hospital Stay: Payer: 59

## 2019-10-30 ENCOUNTER — Ambulatory Visit: Payer: 59 | Admitting: Occupational Therapy

## 2019-10-30 ENCOUNTER — Encounter: Payer: Self-pay | Admitting: Oncology

## 2019-10-30 ENCOUNTER — Inpatient Hospital Stay (HOSPITAL_BASED_OUTPATIENT_CLINIC_OR_DEPARTMENT_OTHER): Payer: 59 | Admitting: Oncology

## 2019-10-30 VITALS — BP 110/72 | HR 64 | Temp 95.7°F | Resp 18 | Wt 227.1 lb

## 2019-10-30 DIAGNOSIS — Z5112 Encounter for antineoplastic immunotherapy: Secondary | ICD-10-CM

## 2019-10-30 DIAGNOSIS — C438 Malignant melanoma of overlapping sites of skin: Secondary | ICD-10-CM

## 2019-10-30 DIAGNOSIS — D649 Anemia, unspecified: Secondary | ICD-10-CM | POA: Insufficient documentation

## 2019-10-30 DIAGNOSIS — C4359 Malignant melanoma of other part of trunk: Secondary | ICD-10-CM | POA: Diagnosis present

## 2019-10-30 DIAGNOSIS — Z79899 Other long term (current) drug therapy: Secondary | ICD-10-CM | POA: Diagnosis not present

## 2019-10-30 DIAGNOSIS — E119 Type 2 diabetes mellitus without complications: Secondary | ICD-10-CM | POA: Insufficient documentation

## 2019-10-30 DIAGNOSIS — E875 Hyperkalemia: Secondary | ICD-10-CM | POA: Insufficient documentation

## 2019-10-30 DIAGNOSIS — L80 Vitiligo: Secondary | ICD-10-CM | POA: Insufficient documentation

## 2019-10-30 LAB — CBC WITH DIFFERENTIAL/PLATELET
Abs Immature Granulocytes: 0.03 10*3/uL (ref 0.00–0.07)
Basophils Absolute: 0 10*3/uL (ref 0.0–0.1)
Basophils Relative: 1 %
Eosinophils Absolute: 0.2 10*3/uL (ref 0.0–0.5)
Eosinophils Relative: 5 %
HCT: 34.6 % — ABNORMAL LOW (ref 39.0–52.0)
Hemoglobin: 11.8 g/dL — ABNORMAL LOW (ref 13.0–17.0)
Immature Granulocytes: 1 %
Lymphocytes Relative: 18 %
Lymphs Abs: 0.8 10*3/uL (ref 0.7–4.0)
MCH: 30.5 pg (ref 26.0–34.0)
MCHC: 34.1 g/dL (ref 30.0–36.0)
MCV: 89.4 fL (ref 80.0–100.0)
Monocytes Absolute: 0.5 10*3/uL (ref 0.1–1.0)
Monocytes Relative: 11 %
Neutro Abs: 2.8 10*3/uL (ref 1.7–7.7)
Neutrophils Relative %: 64 %
Platelets: 203 10*3/uL (ref 150–400)
RBC: 3.87 MIL/uL — ABNORMAL LOW (ref 4.22–5.81)
RDW: 12.9 % (ref 11.5–15.5)
WBC: 4.4 10*3/uL (ref 4.0–10.5)
nRBC: 0 % (ref 0.0–0.2)

## 2019-10-30 LAB — COMPREHENSIVE METABOLIC PANEL
ALT: 33 U/L (ref 0–44)
AST: 25 U/L (ref 15–41)
Albumin: 4.4 g/dL (ref 3.5–5.0)
Alkaline Phosphatase: 88 U/L (ref 38–126)
Anion gap: 6 (ref 5–15)
BUN: 42 mg/dL — ABNORMAL HIGH (ref 6–20)
CO2: 23 mmol/L (ref 22–32)
Calcium: 9.1 mg/dL (ref 8.9–10.3)
Chloride: 107 mmol/L (ref 98–111)
Creatinine, Ser: 1.38 mg/dL — ABNORMAL HIGH (ref 0.61–1.24)
GFR calc Af Amer: >60 mL/min (ref >60)
GFR calc non Af Amer: 55 mL/min — ABNORMAL LOW (ref >60)
Glucose, Bld: 118 mg/dL — ABNORMAL HIGH (ref 70–99)
Potassium: 5.2 mmol/L — ABNORMAL HIGH (ref 3.5–5.1)
Sodium: 136 mmol/L (ref 135–145)
Total Bilirubin: 0.5 mg/dL (ref 0.3–1.2)
Total Protein: 7.3 g/dL (ref 6.5–8.1)

## 2019-10-30 LAB — TSH: TSH: 2.843 u[IU]/mL (ref 0.350–4.500)

## 2019-10-30 MED ORDER — SODIUM CHLORIDE 0.9 % IV SOLN
Freq: Once | INTRAVENOUS | Status: AC
  Filled 2019-10-30: qty 250

## 2019-10-30 MED ORDER — SODIUM CHLORIDE 0.9% FLUSH
10.0000 mL | Freq: Once | INTRAVENOUS | Status: AC
  Administered 2019-10-30: 10 mL
  Filled 2019-10-30: qty 10

## 2019-10-30 MED ORDER — SODIUM CHLORIDE 0.9 % IV SOLN
240.0000 mg | Freq: Once | INTRAVENOUS | Status: AC
  Administered 2019-10-30: 240 mg via INTRAVENOUS
  Filled 2019-10-30: qty 24

## 2019-10-30 MED ORDER — HEPARIN SOD (PORK) LOCK FLUSH 100 UNIT/ML IV SOLN
INTRAVENOUS | Status: AC
  Filled 2019-10-30: qty 5

## 2019-10-30 MED ORDER — HEPARIN SOD (PORK) LOCK FLUSH 100 UNIT/ML IV SOLN
500.0000 [IU] | Freq: Once | INTRAVENOUS | Status: AC | PRN
  Administered 2019-10-30: 500 [IU]
  Filled 2019-10-30: qty 5

## 2019-10-30 NOTE — Progress Notes (Signed)
Hematology/Oncology follow up  note [name] [phone] [phone]   Patient Care Team: [name], [name], MD as PCP - General (Student)  REFERRING PROVIDER: Cherylann Parr, MD  CHIEF COMPLAINTS/REASON FOR VISIT:  Follow up for melanoma HISTORY OF PRESENTING ILLNESS:   Edward Pitts is a  58 y.o.  male with PMH listed below was seen in consultation at the request of  [name], [name], MD  for evaluation of inguinal mass Patient presented to emergency room 3 days ago complaining about left ing uinal mass discomfort. Reports that he has really noticed the mass growing for the past 1 months. He has a history of left lower extremity melanoma in 2011, status post local excision.  Pain was increased with squatting of laxation. He was advised to take Tylenol for pain. Denies any fever, chills, night sweating.  He does feel mild nauseated. Appetite is fair.  He has lost about 10 pounds since earlier this year. 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  Left inguinal and left iliac nodes which are suspicious for involvement.  There are also 2 small nonspecific hypodense lesions within the right liver, nonspecific.  # patient underwent left groin mass resection On 04/16/2019. Resection pathology showed malignant melanoma, replacing a lymph node, with extracapsular extension, peripheral and deep margins involved.  Left inguinal contents, all 7 lymph nodes were negative for melanoma in the lymph nodes.  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  # PDL1 80% TPS  # 04/16/2019. underwent left groin mass resection   07/07/2019  Status post adjuvant radiation and finished radiation  Patient has Mediport placed to facilitate immunotherapy treatments. INTERVAL HISTORY Edward Pitts is a 59 y.o. male who has above history  reviewed by me today presents for follow up visit for management of inguinal nodal recurrence of melanoma Problems and complaints are listed below: Left knee arthritis, patient is getting evaluation at the [place]. Patient has no new complaints.  Denies any shortness of, diarrhea, skin rash.  . Review of Systems  Constitutional: Negative for appetite change, chills, fatigue, fever and unexpected weight change.  HENT:   Negative for hearing loss and voice change.   Eyes: Negative for eye problems and icterus.  Respiratory: Negative for chest tightness, cough and shortness of breath.   Cardiovascular: Negative for chest pain and leg swelling.  Gastrointestinal: Negative for abdominal distention and abdominal pain.  Endocrine: Negative for hot flashes.  Genitourinary: Negative for difficulty urinating, dysuria and frequency.   Musculoskeletal: Positive for arthralgias.  Skin: Negative for itching and rash.       Status post left groin mass resection. Skin hypo-pigmentation on upper extremities.  Neurological: Negative for light-headedness and numbness.  Hematological: Negative for adenopathy. Does not bruise/bleed easily.  Psychiatric/Behavioral: Negative for confusion.    MEDICAL HISTORY:  Past Medical History:  Diagnosis Date  . Anxiety   . Arthritis   . Complication of anesthesia   . Family history of adverse reaction to anesthesia    PONV mother  . Hyperlipidemia   . Hypertension   . Melanoma ([place]) 2012  . PONV (postoperative nausea and vomiting) 04/16/2019  . Presence of permanent cardiac pacemaker    [employer]tronic  . Seizures ([place])     SURGICAL HISTORY: Past Surgical History:  Procedure Laterality Date  . KNEE SURGERY Left   . LEFT HEART CATH AND  CORONARY ANGIOGRAPHY Left 06/29/2017   Procedure: LEFT HEART CATH AND CORONARY ANGIOGRAPHY;  Surgeon: [name], MD;  Location: [facility];  Service: Cardiovascular;  Laterality: Left;  . LYMPH NODE DISSECTION Left  04/16/2019   Procedure: Left inguinal Lymph Node Dissection;  Surgeon: [name], MD;  Location: [place] [facility];  Service: General;  Laterality: Left;  Marland Kitchen MELANOMA EXCISION Left 04/16/2019   Procedure: MELANOMA EXCISION LEFT GROIN MASS;  Surgeon: [name], MD;  Location: [facility] [employer];  Service: General;  Laterality: Left;  Marland Kitchen MELANOMA EXCISION WITH SENTINEL LYMPH NODE BIOPSY Left 2012   Left calf   . PACEMAKER INSERTION N/A 08/26/2018   Procedure: INSERTION PACEMAKER;  Surgeon: [name], MD;  Location: [facility];  Service: Cardiovascular;  Laterality: N/A;  . PORTA CATH INSERTION N/A 08/26/2019   Procedure: PORTA CATH INSERTION;  Surgeon: Katha Cabal, MD;  Location: [facility];  Service: Cardiovascular;  Laterality: N/A;  . TEMPORARY PACEMAKER N/A 08/25/2018   Procedure: TEMPORARY PACEMAKER;  Surgeon: [name], MD;  Location: [place] [facility];  Service: Cardiovascular;  Laterality: N/A;    SOCIAL HISTORY: Social History   Socioeconomic History  . Marital status: Married    Spouse name: Vicente Males   . Number of children: 7  . Years of education: 32  . Highest education level: Not on file  Occupational History  . Occupation: [employer]   Tobacco Use  . Smoking status: Never Smoker  . Smokeless tobacco: Never Used  Vaping Use  . Vaping Use: Never used  Substance and Sexual Activity  . Alcohol use: No  . Drug use: No  . Sexual activity: Not on file  Other Topics Concern  . Not on file  Social History Narrative   Lives with mother, wife and sister   Caffeine use: sodas (2 per day)   Social Determinants of Health   Financial Resource Strain: Low Risk   . Difficulty of Paying Living Expenses: Not hard at all  Food Insecurity: No Food Insecurity  . Worried About Charity fundraiser in the Last Year: Never true  . Ran Out of Food in the Last Year: Never true  Transportation Needs: Unmet Transportation Needs  . Lack of Transportation (Medical): Yes  . Lack of  Transportation (Non-Medical): Yes  Physical Activity: Unknown  . Days of Exercise per Week: 0 days  . Minutes of Exercise per Session: Not on file  Stress: No Stress Concern Present  . Feeling of Stress : Only a little  Social Connections: Unknown  . Frequency of Communication with Friends and Family: More than three times a week  . Frequency of Social Gatherings with Friends and Family: Not on file  . Attends Religious Services: Not on file  . Active Member of Clubs or Organizations: Not on file  . Attends Archivist Meetings: Not on file  . Marital Status: Married  Human resources officer Violence: Not At Risk  . Fear of Current or Ex-Partner: No  . Emotionally Abused: No  . Physically Abused: No  . Sexually Abused: No    FAMILY HISTORY: Family History  Problem Relation Age of Onset  . Cancer Paternal Grandmother     ALLERGIES:  is allergic to ibuprofen and nsaids.  MEDICATIONS:  Current Outpatient Medications  Medication Sig Dispense Refill  . acetaminophen (TYLENOL) 650 MG CR tablet Take 650 mg by mouth every 8 (eight) hours as needed for pain.     Marland Kitchen allopurinol (ZYLOPRIM) 300  MG tablet Take 300 mg by mouth daily.    Marland Kitchen aspirin 81 MG chewable tablet Chew 81 mg by mouth daily.    Marland Kitchen atorvastatin (LIPITOR) 20 MG tablet Take 20 mg by mouth daily.    . clonazePAM (KLONOPIN) 0.5 MG tablet 1 tablet in the morning, 2 in the evening (Patient taking differently: Take 0.5 mg by mouth daily. Takes 2 tabs as needed.) 90 tablet 3  . hydrocortisone 2.5 % cream Apply topically daily as needed.    Marland Kitchen ketoconazole (NIZORAL) 2 % cream Apply 1 application topically daily as needed for irritation.    . lidocaine-prilocaine (EMLA) cream Apply 1 application topically as needed. Apply small amount of cream to port site approx 1-2 hours prior to appointment. 30 g 2  . lisinopril (ZESTRIL) 20 MG tablet Take 20 mg by mouth daily.    . metoprolol succinate (TOPROL-XL) 25 MG 24 hr tablet Take 25 mg  by mouth daily.     . Multiple Vitamin (MULTIVITAMIN WITH MINERALS) TABS tablet Take 1 tablet by mouth daily. Centrum Silver    . ondansetron (ZOFRAN) 8 MG tablet Take 1 tablet (8 mg total) by mouth every 8 (eight) hours as needed for nausea, vomiting or refractory nausea / vomiting. 30 tablet 0  . silver sulfADIAZINE (SILVADENE) 1 % cream Apply 1 application topically 2 (two) times daily. (Patient not taking: Reported on 08/06/2019) 50 g 2  . traMADol (ULTRAM) 50 MG tablet Take 1 tablet (50 mg total) by mouth every 6 (six) hours as needed. (Patient not taking: Reported on 08/19/2019) 30 tablet 0   No current facility-administered medications for this visit.   Facility-Administered Medications Ordered in Other Visits  Medication Dose Route Frequency Provider Last Rate Last Admin  . sodium chloride flush (NS) 0.9 % injection 10 mL  10 mL Intracatheter Once [name], MD         PHYSICAL EXAMINATION: ECOG PERFORMANCE STATUS: 0 - Asymptomatic Vitals:   10/30/19 0842  BP: 110/72  Pulse: 64  Resp: 18  Temp: (!) 95.7 F (35.4 C)   Filed Weights   10/30/19 0842  Weight: 227 lb 1.6 oz (103 kg)    Physical Exam Constitutional:      General: He is not in acute distress. HENT:     Head: Normocephalic and atraumatic.  Eyes:     General: No scleral icterus.    Pupils: Pupils are equal, round, and reactive to light.  Cardiovascular:     Rate and Rhythm: Normal rate and regular rhythm.     Heart sounds: Normal heart sounds.  Pulmonary:     Effort: Pulmonary effort is normal. No respiratory distress.     Breath sounds: No wheezing.  Abdominal:     General: Bowel sounds are normal. There is no distension.     Palpations: Abdomen is soft. There is no mass.     Tenderness: There is no abdominal tenderness.     Comments:  left groin mass resection and status post radiation.   Musculoskeletal:        General: No deformity. Normal range of motion.     Cervical back: Normal range of motion  and neck supple.     Comments: Left lower extremity edema  Skin:    General: Skin is warm and dry.     Findings: No erythema or rash.  Neurological:     Mental Status: He is alert and oriented to person, place, and time. Mental status is at baseline.  Cranial Nerves: No cranial nerve deficit.     Coordination: Coordination normal.  Psychiatric:        Mood and Affect: Mood normal.    LABORATORY DATA:  I have reviewed the data as listed Lab Results  Component Value Date   WBC 5.2 10/16/2019   HGB 11.5 (L) 10/16/2019   HCT 33.9 (L) 10/16/2019   MCV 88.7 10/16/2019   PLT 211 10/16/2019   Recent Labs    09/17/19 0839 10/02/19 0841 10/16/19 0834  NA 140 140 138  K 4.3 4.9 5.2*  CL 106 106 107  CO2 _0 GLUCOSE 124* 117* 110*  BUN 25* 29* 28*  CREATININE 1.14 1.37* 1.36*  CALCIUM 9.1 9.4 9.3  GFRNONAA >60 56* 56*  GFRAA >60 >60 >60  PROT 7.1 7.6 7.2  ALBUMIN 4.2 4.4 4.3  AST _1 ALT 34 37 28  ALKPHOS 84 85 85  BILITOT 0.7 0.7 0.7   Iron/TIBC/Ferritin/ %Sat    Component Value Date/Time   IRON 85 10/02/2019 0841   TIBC 342 10/02/2019 0841   FERRITIN 61 10/02/2019 0841   IRONPCTSAT 25 10/02/2019 0841      RADIOGRAPHIC STUDIES: I have personally reviewed the radiological images as listed and agreed with the findings in the report. CT Chest W Contrast  Result Date: 09/10/2019 CLINICAL DATA:  Staging of malignant melanoma, status post left groin mass resection in January. On immunotherapy. Loose stools. Skin melanoma resection in 2012. EXAM: CT CHEST, ABDOMEN, AND PELVIS WITH CONTRAST TECHNIQUE: Multidetector CT imaging of the chest, abdomen and pelvis was performed following the standard protocol during bolus administration of intravenous contrast. CONTRAST:  183m OMNIPAQUE IOHEXOL 300 MG/ML  SOLN COMPARISON:  01/31/2019 CTA of the abdomen and pelvis. PET of 02/11/2019. No prior dedicated chest CTs. FINDINGS: CT CHEST FINDINGS Cardiovascular: Pacer.  Aortic atherosclerosis. Normal heart size, without pericardial effusion. No central pulmonary embolism, on this non-dedicated study. Lad coronary artery calcification. Mediastinum/Nodes: No supraclavicular adenopathy. 8 mm right-sided thyroid nodule. No supraclavicular adenopathy. No mediastinal or hilar adenopathy. Lungs/Pleura: No pleural fluid. No suspicious pulmonary nodule or mass. Musculoskeletal: No acute osseous abnormality. CT ABDOMEN PELVIS FINDINGS Hepatobiliary: Subcentimeter right hepatic lobe cyst. There is also a too small to characterize, 3-4 mm more inferior right hepatic lobe lesion on 60/2. Likely present on the prior PET. Normal gallbladder, without biliary ductal dilatation. Pancreas: Normal, without mass or ductal dilatation. Spleen: Normal in size, without focal abnormality. Adrenals/Urinary Tract: Normal adrenal glands. Lower pole right renal sinus cyst or minimally complex cyst of 1.9 cm. Upper pole right renal subcentimeter lesion is too small to characterize. No hydronephrosis. Normal urinary bladder. Stomach/Bowel: Normal stomach, without wall thickening. Normal colon and terminal ileum. Normal small bowel. Vascular/Lymphatic: Advanced aortic and branch vessel atherosclerosis. The previously described dominant left inguinal mass has been resected, with subcutaneous and intramuscular edema identified. The left external iliac nodes measure maximally 5 mm today on 107/2 versus 8 mm on the prior PET (when remeasured). No pelvic sidewall adenopathy. Reproductive: Normal prostate. Other: No significant free fluid. Moderate size fat containing periumbilical ventral wall hernia. Musculoskeletal: Degenerative partial fusion of the right sacroiliac joint. IMPRESSION: 1. Interval resection of previously described left inguinal mass. The indeterminate left external iliac nodes on prior PET are decreased in size. 2. No evidence of new or progressive disease. 3. Coronary artery atherosclerosis. Aortic  Atherosclerosis (ICD10-I70.0). 4. A too small to characterize right hepatic lobe low-density lesion is felt to be  similar to on the prior PET, not hypermetabolic on that exam. Felt unlikely to represent metastasis but technically indeterminate. Recommend attention on follow-up. 5. 8 mm right thyroid nodule. Not clinically significant; no follow-up imaging recommended (ref: J Am Coll Radiol. 2015 Feb;12(2): 143-50). Electronically Signed   By: Abigail Miyamoto M.D.   On: 09/10/2019 11:08   CT Abdomen Pelvis W Contrast  Result Date: 09/10/2019 CLINICAL DATA:  Staging of malignant melanoma, status post left groin mass resection in January. On immunotherapy. Loose stools. Skin melanoma resection in 2012. EXAM: CT CHEST, ABDOMEN, AND PELVIS WITH CONTRAST TECHNIQUE: Multidetector CT imaging of the chest, abdomen and pelvis was performed following the standard protocol during bolus administration of intravenous contrast. CONTRAST:  153m OMNIPAQUE IOHEXOL 300 MG/ML  SOLN COMPARISON:  01/31/2019 CTA of the abdomen and pelvis. PET of 02/11/2019. No prior dedicated chest CTs. FINDINGS: CT CHEST FINDINGS Cardiovascular: Pacer. Aortic atherosclerosis. Normal heart size, without pericardial effusion. No central pulmonary embolism, on this non-dedicated study. Lad coronary artery calcification. Mediastinum/Nodes: No supraclavicular adenopathy. 8 mm right-sided thyroid nodule. No supraclavicular adenopathy. No mediastinal or hilar adenopathy. Lungs/Pleura: No pleural fluid. No suspicious pulmonary nodule or mass. Musculoskeletal: No acute osseous abnormality. CT ABDOMEN PELVIS FINDINGS Hepatobiliary: Subcentimeter right hepatic lobe cyst. There is also a too small to characterize, 3-4 mm more inferior right hepatic lobe lesion on 60/2. Likely present on the prior PET. Normal gallbladder, without biliary ductal dilatation. Pancreas: Normal, without mass or ductal dilatation. Spleen: Normal in size, without focal abnormality.  Adrenals/Urinary Tract: Normal adrenal glands. Lower pole right renal sinus cyst or minimally complex cyst of 1.9 cm. Upper pole right renal subcentimeter lesion is too small to characterize. No hydronephrosis. Normal urinary bladder. Stomach/Bowel: Normal stomach, without wall thickening. Normal colon and terminal ileum. Normal small bowel. Vascular/Lymphatic: Advanced aortic and branch vessel atherosclerosis. The previously described dominant left inguinal mass has been resected, with subcutaneous and intramuscular edema identified. The left external iliac nodes measure maximally 5 mm today on 107/2 versus 8 mm on the prior PET (when remeasured). No pelvic sidewall adenopathy. Reproductive: Normal prostate. Other: No significant free fluid. Moderate size fat containing periumbilical ventral wall hernia. Musculoskeletal: Degenerative partial fusion of the right sacroiliac joint. IMPRESSION: 1. Interval resection of previously described left inguinal mass. The indeterminate left external iliac nodes on prior PET are decreased in size. 2. No evidence of new or progressive disease. 3. Coronary artery atherosclerosis. Aortic Atherosclerosis (ICD10-I70.0). 4. A too small to characterize right hepatic lobe low-density lesion is felt to be similar to on the prior PET, not hypermetabolic on that exam. Felt unlikely to represent metastasis but technically indeterminate. Recommend attention on follow-up. 5. 8 mm right thyroid nodule. Not clinically significant; no follow-up imaging recommended (ref: J Am Coll Radiol. 2015 Feb;12(2): 143-50). Electronically Signed   By: KAbigail MiyamotoM.D.   On: 09/10/2019 11:08   PERIPHERAL VASCULAR CATHETERIZATION  Result Date: 08/26/2019 See op note      ASSESSMENT & PLAN:  1. Malignant melanoma of overlapping sites ([place]   2. Encounter for antineoplastic immunotherapy   3. Vitiligo    #Left inguinal nodal recurrence of melanoma.  Status post resection and adjuvant  radiation. Labs are reviewed and discussed with the patient Counts acceptable.  Proceed with nivolumab today..Marland Kitchen #Vitiligo, stable. . # Hyperkalemia, K is 5.2, recommend patient to decrease intake of potassium rich food. Recommend patient to discuss with primary care provider about his potassium level.  Patient is on lisinopril  which may increase potassium level.  #Normocytic anemia, stable.  Continue to monitor. #[facility] Recommend him to discuss with surgeon whether left inguinal lymph node dissection may affect his wound healing. He has lymphedema.  All questions were answered. The patient knows to call the clinic with any problems questions or concerns.  Follow-up with 2 weeks. We spent sufficient time to discuss many aspect of care, questions were answered to patient's satisfaction.   [name], MD, PhD Hematology Oncology [facility] Pager- [phone] 10/30/2019

## 2019-10-30 NOTE — Progress Notes (Signed)
Patient denies new problems/concerns today.   °

## 2019-11-05 ENCOUNTER — Ambulatory Visit: Payer: 59 | Admitting: Occupational Therapy

## 2019-11-05 ENCOUNTER — Other Ambulatory Visit: Payer: Self-pay

## 2019-11-05 ENCOUNTER — Ambulatory Visit: Payer: 59 | Attending: Oncology | Admitting: Occupational Therapy

## 2019-11-05 DIAGNOSIS — I890 Lymphedema, not elsewhere classified: Secondary | ICD-10-CM | POA: Insufficient documentation

## 2019-11-05 NOTE — Therapy (Signed)
[place] MA[facility] SERVICES [address] [place], [place], [address] Phone: [phone]   Fax:  [phone]  Occupational Therapy Treatment  Patient Details  Name: Edward Pitts MRN: [id number] Date of Birth: 04-30-1960 Referring Provider (OT): [name], MD   Encounter Date: 11/05/2019   OT End of Session - 11/05/19 1641    Visit Number 28    Number of Visits 36    Date for OT Re-Evaluation 02/03/20    OT Start Time 0100    OT Stop Time 0200    OT Time Calculation (min) 60 min    Activity Tolerance Patient tolerated treatment well;No increased pain    Behavior During Therapy WFL for tasks assessed/performed           Past Medical History:  Diagnosis Date  . Anxiety   . Arthritis   . Complication of anesthesia   . Family history of adverse reaction to anesthesia    PONV mother  . Hyperlipidemia   . Hypertension   . Melanoma ([place]) 2012  . PONV (postoperative nausea and vomiting) 04/16/2019  . Presence of permanent cardiac pacemaker    Medtronic  . Seizures ([place])     Past Surgical History:  Procedure Laterality Date  . KNEE SURGERY Left   . LEFT HEART CATH AND CORONARY ANGIOGRAPHY Left 06/29/2017   Procedure: LEFT HEART CATH AND CORONARY ANGIOGRAPHY;  Surgeon: [name], MD;  Location: [place] [facility];  Service: Cardiovascular;  Laterality: Left;  . LYMPH NODE DISSECTION Left 04/16/2019   Procedure: Left inguinal Lymph Node Dissection;  Surgeon: [name], MD;  Location: [place];  Service: General;  Laterality: Left;  Marland Kitchen MELANOMA EXCISION Left 04/16/2019   Procedure: MELANOMA EXCISION LEFT GROIN MASS;  Surgeon: [name], MD;  Location: [place];  Service: General;  Laterality: Left;  Marland Kitchen MELANOMA EXCISION WITH SENTINEL LYMPH NODE BIOPSY Left 2012   Left calf   . PACEMAKER INSERTION N/A 08/26/2018   Procedure: INSERTION PACEMAKER;  Surgeon: [name], MD;  Location: [facility];  Service: Cardiovascular;   Laterality: N/A;  . PORTA CATH INSERTION N/A 08/26/2019   Procedure: PORTA CATH INSERTION;  Surgeon: Katha Cabal, MD;  Location: [place] [facility];  Service: Cardiovascular;  Laterality: N/A;  . TEMPORARY PACEMAKER N/A 08/25/2018   Procedure: TEMPORARY PACEMAKER;  Surgeon: [name], MD;  Location: [place] [facility];  Service: Cardiovascular;  Laterality: N/A;    There were no vitals filed for this visit.   Subjective Assessment - 11/05/19 1307    Subjective  Edward Pitts presents to OT for visit 28/36 to address LLE lymphedema 2/2 melanoma Rx. Pt was last seen on 7/14. His insurance terminated and he had to suspend OT for LE care until new policy was activated. Pt reports he has not been using compression wraps, but has been using an off the shelf compression thigh high daily. He also reports he has been using Flexitouch device 3-4 x weekly during time he was unable to attend OT.    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 w/ LN disection (-7/7 LN); Completed adjuvant XRT 07/07/2019; 08/2018 Pacemaker placed; L knee sx date?;    Limitations chronic LLE knee pain; post surgical LLE pain,  chronic LLE/LLQ swelling, decreased LLE strength, impaired gait, decreased dynamic balance, altered sensation LLE, increased risk of infection, decreased standing and walking tolerance > 1 hr; decreased ankle AROM  Repetition Increases Symptoms    Special Tests +Stemmer base L toes    Patient Stated Goals reduce swelling to normal and keep it from getting worse    Pain Onset More than a month ago               LYMPHEDEMA/ONCOLOGY QUESTIONNAIRE - 11/05/19 0001      Left Lower Extremity Lymphedema   Other LLE A-D volume =3549.7 ml. This value reveals a 5.7% limb volume reduction from ankle to below knee since last measured on 6/24.  RLE E-G volume = 6912.3 ml. This value reveals a 5.8% volume increase in the L thigh since 6/24.   LLE A-G (full limb) volume = 10462.0 ml. This value reveals full limb volume is increased by 1.6% from ankle to groin since 6/24.                    OT Treatments/Exercises (OP) - 11/05/19 0001      ADLs   ADL Education Given Yes      Manual Therapy   Manual Therapy Edema management    Manual therapy comments anatomical measurements for custom compression thigh high    Edema Management comparative limb volumetrics                  OT Education - 11/05/19 1327    Education Details Continued skilled Pt/caregiver education  And LE ADL training throughout visit for lymphedema self care/ home program, including compression wrapping, compression garment and device wear/care, lymphatic pumping ther ex, simple self-MLD, and skin care. Discussed progress towards goals.    Person(s) Educated Patient;Spouse    Methods Explanation;Demonstration;Handout    Comprehension Verbalized understanding;Returned demonstration;Need further instruction               OT Long Term Goals - 09/18/19 1303      OT LONG TERM GOAL #1   Title Pt will be able to apply thigh length, multi-layer, short stretch compression wraps daily to single limb using correct gradient techniques with Maximum assistance to achieve optimal limb volume reduction, to return affected limb/s, as closely as possible, to premorbid size and shape, to limit infection risk, and to improve safe functional ambulation and mobility.    Status Revised   Pt unable to bend left knee to extent needed to appy compression garments independently. Cargiver is independent with gradient techniques.     OT LONG TERM GOAL #2   Title Pt will be able to verbalize signs and symptoms of cellulitis infection and identify at least 4 common lymphedema precautions using printed resource for reference to limit LE progression over time to limit risk of infection and LE exacerbation.    Status Achieved      OT LONG TERM GOAL #3   Status Achieved       OT LONG TERM GOAL #4   Status Achieved   achieved this date.. Goal met for LLE     OT LONG TERM GOAL #5   Status Achieved      OT LONG TERM GOAL #6   Status On-going                 Plan - 11/05/19 1641    Clinical Impression Statement Completed LLE limb volumetrics to assess changes in swelling all all limb segments during long Rx interval bc Pt's insurance was terminated. LLE A-D volume =3549.7 ml. This value reveals a 5.7% limb volume reduction from ankle to below knee since last measured  on 6/24.  RLE E-G volume = 6912.3 ml. This value reveals a 5.8% volume increase in the L thigh since 6/24.  LLE A-G (full limb) volume = 10462.0 ml. This value reveals full limb volume is increased by 1.6% from ankle to groin since 6/24. Overall Pt has managed quite well on his own over the last month. Although he did not wrap daily he has used an off the shelf, non-medical grade compression  thigh high daily. He also uses his Flexitouch sequential pneumatic device 3-4 x weekly by report. We repeated compression garment measurements and faxed to DME vendor. Garments altered from 1 1/2 leg garment to thigh high only. Many measurements had changed. Pt will continue OT 1 x weekly until garments are fitted.    OT Occupational Profile and History Comprehensive Assessment- Review of records and extensive additional review of physical, cognitive, psychosocial history related to current functional performance    Occupational performance deficits (Please refer to evaluation for details): ADL's;IADL's;Rest and Sleep;Work;Leisure;Social Participation;Other   body image, role performance   Body Structure / Function / Physical Skills ADL;ROM;Scar mobility;IADL;Edema;Balance;Sensation;Skin integrity;Mobility;Flexibility;Strength;Decreased knowledge of precautions;Gait;Pain;Decreased knowledge of use of DME    Rehab Potential Good    Clinical Decision Making Several treatment options, min-mod task modification  necessary    Comorbidities Affecting Occupational Performance: Presence of comorbidities impacting occupational performance    Modification or Assistance to Complete Evaluation  Min-Moderate modification of tasks or assist with assess necessary to complete eval    OT Frequency 2x / week    OT Duration 12 weeks   and PRN   OT Treatment/Interventions Self-care/ADL training;Therapeutic exercise;Functional Mobility Training;Manual Therapy;Coping strategies training;Therapeutic activities;Manual lymph drainage;Energy conservation;DME and/or AE instruction;Compression bandaging;Other (comment);Scar mobilization;Patient/family education   fit with appropriate compression garments once swelling is reduced   Plan Complete Decongestive Therapy (CDT), Intensive and Management Phases to include  manual lymphatyic drainage (MLD), skin care, therapeutic exercise, compression with short stretch wraps and garments, Pt edu for LE sef care    Consulted and Agree with Plan of Care Patient           Patient will benefit from skilled therapeutic intervention in order to improve the following deficits and impairments:   Body Structure / Function / Physical Skills: ADL, ROM, Scar mobility, IADL, Edema, Balance, Sensation, Skin integrity, Mobility, Flexibility, Strength, Decreased knowledge of precautions, Gait, Pain, Decreased knowledge of use of DME       Visit Diagnosis: Lymphedema, not elsewhere classified - Plan: Ot plan of care cert/re-cert    Problem List Patient Active Problem List   Diagnosis Date Noted  . Vitiligo 09/03/2019  . Port-A-Cath in place 08/20/2019  . Encounter for antineoplastic immunotherapy 08/06/2019  . Malignant melanoma of overlapping sites ([place]) 04/23/2019  . Goals of care, counseling/discussion 04/23/2019  . 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 Near syncope 12/17/2018  . Anemia in chronic kidney disease 12/11/2018  . Benign hypertensive kidney  disease with chronic kidney disease 12/11/2018  . Stage 3a chronic kidney disease 12/11/2018  . AKI (acute kidney injury) ([place]) 08/24/2018  . Acute hyperkalemia 08/24/2018  . HTN (hypertension) 08/24/2018  . HLD (hyperlipidemia) 08/24/2018  . Seizures ([place]) 08/24/2018  . Complete heart block ([place]) 08/24/2018  . Arthritis of wrist, left 06/12/2018  . Bradycardia 06/04/2018  . 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 Positive ANA (antinuclear antibody) 03/05/2018  . Swelling of joint of left wrist 03/05/2018  . Coronary artery disease involving native coronary artery  of native heart 07/09/2017  . Palpitations 07/09/2017  . Stable angina ([place]) 06/27/2017  . Benign essential HTN 05/25/2017  . LBBB (left bundle branch block) 05/25/2017    Andrey Spearman, MS, OTR/L, [facility] 11/05/19 4:47 PM   [place] MAIN [facility] SERVICES [address] [place], [place], [address] Phone: [phone]   Fax:  [phone]  Name: Samba Cumba MRN: [id number] Date of Birth: 06-13-1960

## 2019-11-06 ENCOUNTER — Ambulatory Visit: Payer: 59 | Admitting: Occupational Therapy

## 2019-11-07 ENCOUNTER — Other Ambulatory Visit: Payer: Self-pay

## 2019-11-07 ENCOUNTER — Ambulatory Visit: Payer: 59 | Admitting: Occupational Therapy

## 2019-11-07 DIAGNOSIS — I890 Lymphedema, not elsewhere classified: Secondary | ICD-10-CM | POA: Diagnosis not present

## 2019-11-07 NOTE — Therapy (Signed)
[facility] REHAB SERVICES [address] [place], [place], [address] Phone: [phone]   Fax:  [phone]  Occupational Therapy Treatment  Patient Details  Name: Edward Pitts MRN: [id number] Date of Birth: 09/13/1960 Referring Provider (OT): Zhou Yu, MD   Encounter Date: 11/07/2019   OT End of Session - 11/07/19 1000    Visit Number 29    Number of Visits 36    Date for OT Re-Evaluation 02/03/20    OT Start Time 0905    OT Stop Time 1000    OT Time Calculation (min) 55 min    Activity Tolerance Patient tolerated treatment well;No increased pain    Behavior During Therapy WFL for tasks assessed/performed           Past Medical History:  Diagnosis Date  . Anxiety   . Arthritis   . Complication of anesthesia   . Family history of adverse reaction to anesthesia    PONV mother  . Hyperlipidemia   . Hypertension   . Melanoma ([facility]) 2012  . PONV (postoperative nausea and vomiting) 04/16/2019  . Presence of permanent cardiac pacemaker    Medtronic  . Seizures ([facility])     Past Surgical History:  Procedure Laterality Date  . KNEE SURGERY Left   . LEFT HEART CATH AND CORONARY ANGIOGRAPHY Left 06/29/2017   Procedure: LEFT HEART CATH AND CORONARY ANGIOGRAPHY;  Surgeon: Kowalski, Bruce J, MD;  Location: [facility];  Service: Cardiovascular;  Laterality: Left;  . LYMPH NODE DISSECTION Left 04/16/2019   Procedure: Left inguinal Lymph Node Dissection;  Surgeon: Byerly, Faera, MD;  Location: [facility];  Service: General;  Laterality: Left;  . MELANOMA EXCISION Left 04/16/2019   Procedure: MELANOMA EXCISION LEFT GROIN MASS;  Surgeon: Byerly, Faera, MD;  Location: [facility];  Service: General;  Laterality: Left;  . MELANOMA EXCISION WITH SENTINEL LYMPH NODE BIOPSY Left 2012   Left calf   . PACEMAKER INSERTION N/A 08/26/2018   Procedure: INSERTION PACEMAKER;  Surgeon: Paraschos, Alexander, MD;  Location: [facility];  Service: Cardiovascular;   Laterality: N/A;  . PORTA CATH INSERTION N/A 08/26/2019   Procedure: PORTA CATH INSERTION;  Surgeon: Schnier, Gregory G, MD;  Location: [facility];  Service: Cardiovascular;  Laterality: N/A;  . TEMPORARY PACEMAKER N/A 08/25/2018   Procedure: TEMPORARY PACEMAKER;  Surgeon: Cooper, Michael, MD;  Location: [facility];  Service: Cardiovascular;  Laterality: N/A;    There were no vitals filed for this visit.   Subjective Assessment - 11/07/19 0912    Subjective  Edward Pitts presents to OT for visit 29/36 to address LLE lymphedema 2/2 melanoma Rx. Edward Pitts presents wearing off the shelf compression garment he purchased on [employer]. Ptreports he use Flexi sequential "pump" yesterday. 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 No LE related pain this morning.    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 w/ LN disection (-7/7 LN); Completed adjuvant XRT 07/07/2019; 08/2018 Pacemaker placed; L knee sx date?;    Limitations chronic LLE knee pain; post surgical LLE pain,  chronic LLE/LLQ swelling, decreased LLE strength, impaired gait, decreased dynamic balance, altered sensation LLE, increased risk of infection, decreased standing and walking tolerance > 1 hr; decreased ankle AROM    Repetition Increases Symptoms    Special Tests +Stemmer base L toes    Patient Stated Goals reduce swelling to normal and keep it from getting worse      Pain Onset More than a month ago                        OT Treatments/Exercises (OP) - 11/07/19 0001      ADLs   ADL Education Given Yes      Manual Therapy   Manual Therapy Edema management;Manual Lymphatic Drainage (MLD)    Manual Lymphatic Drainage (MLD) MLDto LLE/LLQ in supine and sidelying utilizing short neck sequence, deep abdominals with diaphragmatic breathing, ipsilateral inguinal -axiillary anastomosis and proximal to distal leg sequence.     Compression Bandaging Max A to don OTS thigh  length compression stocking using AE                  OT Education - 11/07/19 0959    Education Details Pt edu for donning compression garments using friction gloves and Ripstop sock    Person(s) Educated Patient;Spouse    Methods Explanation;Demonstration;Handout    Comprehension Verbalized understanding;Returned demonstration;Need further instruction               OT Long Term Goals - 09/18/19 1303      OT LONG TERM GOAL #1   Title Pt will be able to apply thigh length, multi-layer, short stretch compression wraps daily to single limb using correct gradient techniques with Maximum assistance to achieve optimal limb volume reduction, to return affected limb/s, as closely as possible, to premorbid size and shape, to limit infection risk, and to improve safe functional ambulation and mobility.    Status Revised   Pt unable to bend left knee to extent needed to appy compression garments independently. Cargiver is independent with gradient techniques.     OT LONG TERM GOAL #2   Title Pt will be able to verbalize signs and symptoms of cellulitis infection and identify at least 4 common lymphedema precautions using printed resource for reference to limit LE progression over time to limit risk of infection and LE exacerbation.    Status Achieved      OT LONG TERM GOAL #3   Status Achieved      OT LONG TERM GOAL #4   Status Achieved   achieved this date.. Goal met for LLE     OT LONG TERM GOAL #5   Status Achieved      OT LONG TERM GOAL #6   Status On-going                 Plan - 11/07/19 1000    Clinical Impression Statement Pt tolerated MLD without increased pain. Provided skilled EDU for donning compression stocking using friction gloves and ripstop nylon sock. Max A to don garment. Pt able to position garment at groin vs mid thigh after training. Cont as per POC. Custom garment is ordered.    OT Occupational Profile and History Comprehensive Assessment- Review  of records and extensive additional review of physical, cognitive, psychosocial history related to current functional performance    Occupational performance deficits (Please refer to evaluation for details): ADL's;IADL's;Rest and Sleep;Work;Leisure;Social Participation;Other   body image, role performance   Body Structure / Function / Physical Skills ADL;ROM;Scar mobility;IADL;Edema;Balance;Sensation;Skin integrity;Mobility;Flexibility;Strength;Decreased knowledge of precautions;Gait;Pain;Decreased knowledge of use of DME    Rehab Potential Good    Clinical Decision Making Several treatment options, min-mod task modification necessary    Comorbidities Affecting Occupational Performance: Presence of comorbidities impacting occupational performance    Modification or Assistance to Complete Evaluation  Min-Moderate modification of tasks or assist with assess  necessary to complete eval    OT Frequency 2x / week    OT Duration 12 weeks   and PRN   OT Treatment/Interventions Self-care/ADL training;Therapeutic exercise;Functional Mobility Training;Manual Therapy;Coping strategies training;Therapeutic activities;Manual lymph drainage;Energy conservation;DME and/or AE instruction;Compression bandaging;Other (comment);Scar mobilization;Patient/family education   fit with appropriate compression garments once swelling is reduced   Plan Complete Decongestive Therapy (CDT), Intensive and Management Phases to include  manual lymphatyic drainage (MLD), skin care, therapeutic exercise, compression with short stretch wraps and garments, Pt edu for LE sef care    Consulted and Agree with Plan of Care Patient           Patient will benefit from skilled therapeutic intervention in order to improve the following deficits and impairments:   Body Structure / Function / Physical Skills: ADL, ROM, Scar mobility, IADL, Edema, Balance, Sensation, Skin integrity, Mobility, Flexibility, Strength, Decreased knowledge of  precautions, Gait, Pain, Decreased knowledge of use of DME       Visit Diagnosis: Lymphedema, not elsewhere classified    Problem List Patient Active Problem List   Diagnosis Date Noted  . Vitiligo 09/03/2019  . Port-A-Cath in place 08/20/2019  . Encounter for antineoplastic immunotherapy 08/06/2019  . Malignant melanoma of overlapping sites ([facility]) 04/23/2019  . Goals of care, counseling/discussion 04/23/2019  . 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 Near syncope 12/17/2018  . Anemia in chronic kidney disease 12/11/2018  . Benign hypertensive kidney disease with chronic kidney disease 12/11/2018  . Stage 3a chronic kidney disease 12/11/2018  . AKI (acute kidney injury) ([facility]) 08/24/2018  . Acute hyperkalemia 08/24/2018  . HTN (hypertension) 08/24/2018  . HLD (hyperlipidemia) 08/24/2018  . Seizures ([facility]) 08/24/2018  . Complete heart block ([facility]) 08/24/2018  . Arthritis of wrist, left 06/12/2018  . Bradycardia 06/04/2018  . 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 Positive ANA (antinuclear antibody) 03/05/2018  . Swelling of joint of left wrist 03/05/2018  . Coronary artery disease involving native coronary artery of native heart 07/09/2017  . Palpitations 07/09/2017  . Stable angina ([facility]) 06/27/2017  . Benign essential HTN 05/25/2017  . LBBB (left bundle branch block) 05/25/2017   Theresa Gilliam, MS, OTR/L, CLT-LANA 11/07/19 10:02 AM    [facility] MA[facility] REHAB SERVICES [address] [place], [place], [address] Phone: [phone]   Fax:  [phone]  Name: Musab Kiester MRN: [id number] Date of Birth: 12/27/1960 

## 2019-11-10 ENCOUNTER — Other Ambulatory Visit: Payer: Self-pay

## 2019-11-10 ENCOUNTER — Ambulatory Visit: Payer: 59 | Admitting: Occupational Therapy

## 2019-11-10 DIAGNOSIS — I890 Lymphedema, not elsewhere classified: Secondary | ICD-10-CM | POA: Diagnosis not present

## 2019-11-10 NOTE — Therapy (Signed)
[place] MAIN [facility] SERVICES [address] [place], [place], [address] Phone: [phone]   Fax:  [phone]  Occupational Therapy Treatment Note & Progress Report: Lymphedema Care  Patient Details  Name: Edward Pitts MRN: [id number] Date of Birth: May 16, 1960 Referring Provider (OT): [name], MD   Encounter Date: 11/10/2019   OT End of Session - 11/10/19 0902    Visit Number 30    Number of Visits 36    Date for OT Re-Evaluation 02/03/20    OT Start Time 0850    OT Stop Time 0955    OT Time Calculation (min) 65 min    Activity Tolerance Patient tolerated treatment well;No increased pain    Behavior During Therapy WFL for tasks assessed/performed           Past Medical History:  Diagnosis Date  . Anxiety   . Arthritis   . Complication of anesthesia   . Family history of adverse reaction to anesthesia    PONV mother  . Hyperlipidemia   . Hypertension   . Melanoma ([place]) 2012  . PONV (postoperative nausea and vomiting) 04/16/2019  . Presence of permanent cardiac pacemaker    [employer]  . Seizures ([place])     Past Surgical History:  Procedure Laterality Date  . KNEE SURGERY Left   . LEFT HEART CATH AND CORONARY ANGIOGRAPHY Left 06/29/2017   Procedure: LEFT HEART CATH AND CORONARY ANGIOGRAPHY;  Surgeon: [name], MD;  Location: [facility];  Service: Cardiovascular;  Laterality: Left;  . LYMPH NODE DISSECTION Left 04/16/2019   Procedure: Left inguinal Lymph Node Dissection;  Surgeon: [name], MD;  Location: [place];  Service: General;  Laterality: Left;  Marland Kitchen MELANOMA EXCISION Left 04/16/2019   Procedure: MELANOMA EXCISION LEFT GROIN MASS;  Surgeon: [name], MD;  Location: [place];  Service: General;  Laterality: Left;  Marland Kitchen MELANOMA EXCISION WITH SENTINEL LYMPH NODE BIOPSY Left 2012   Left calf   . PACEMAKER INSERTION N/A 08/26/2018   Procedure: INSERTION PACEMAKER;  Surgeon: [name], MD;  Location:  [facility];  Service: Cardiovascular;  Laterality: N/A;  . PORTA CATH INSERTION N/A 08/26/2019   Procedure: PORTA CATH INSERTION;  Surgeon: Katha Cabal, MD;  Location: [facility];  Service: Cardiovascular;  Laterality: N/A;  . TEMPORARY PACEMAKER N/A 08/25/2018   Procedure: TEMPORARY PACEMAKER;  Surgeon: [name], MD;  Location: [place];  Service: Cardiovascular;  Laterality: N/A;    There were no vitals filed for this visit.   Subjective Assessment - 11/10/19 0859    Subjective  Edward Pitts presents to OT for visit 30/36 to address LLE lymphedema 2/2 melanoma Rx. Edward Pitts presents wearing off the shelf compression garment he purchased on [employer]. 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 Pt denies LE-related pain this morning.    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 w/ LN disection (-7/7 LN); Completed adjuvant XRT 07/07/2019; 08/2018 Pacemaker placed; L knee sx date?;    Limitations chronic LLE knee pain; post surgical LLE pain,  chronic LLE/LLQ swelling, decreased LLE strength, impaired gait, decreased dynamic balance, altered sensation LLE, increased risk of infection, decreased standing and walking tolerance > 1 hr; decreased ankle AROM    Repetition Increases Symptoms    Special Tests +Stemmer base L toes    Patient Stated Goals reduce swelling to normal and keep it from getting worse  Pain Onset More than a month ago                        OT Treatments/Exercises (OP) - 11/10/19 0001      ADLs   ADL Education Given Yes      Manual Therapy   Manual Therapy Edema management;Manual Lymphatic Drainage (MLD);Compression Bandaging    Manual Lymphatic Drainage (MLD) MLDto LLE/LLQ in supine and sidelying utilizing short neck sequence, deep abdominals with diaphragmatic breathing, ipsilateral inguinal -axiillary anastomosis and proximal to distal leg sequence.     Compression Bandaging Max A  to don OTS thigh length compression stocking using AE                  OT Education - 11/10/19 0902    Education Details Continued skilled Pt/caregiver education  And LE ADL training throughout visit for lymphedema self care/ home program, including compression wrapping, compression garment and device wear/care, lymphatic pumping ther ex, simple self-MLD, and skin care. Discussed progress towards goals.    Person(s) Educated Patient;Spouse    Methods Explanation;Demonstration;Handout    Comprehension Verbalized understanding;Returned demonstration;Need further instruction               OT Long Term Goals - 11/10/19 0957      OT LONG TERM GOAL #1   Title Pt will be able to apply thigh length, multi-layer, short stretch compression wraps daily to single limb using correct gradient techniques with Maximum assistance to achieve optimal limb volume reduction, to return affected limb/s, as closely as possible, to premorbid size and shape, to limit infection risk, and to improve safe functional ambulation and mobility.    Status Achieved   Pt unable to bend left knee to extent needed to appy compression garments independently. [place] is independent with gradient techniques.     OT LONG TERM GOAL #2   Title Pt will be able to verbalize signs and symptoms of cellulitis infection and identify at least 4 common lymphedema precautions using printed resource for reference to limit LE progression over time to limit risk of infection and LE exacerbation.    Status Achieved      OT LONG TERM GOAL #3   Status Achieved      OT LONG TERM GOAL #4   Status Achieved   achieved this date.. Goal met for LLE     OT LONG TERM GOAL #5   Status Achieved      OT LONG TERM GOAL #6   Status Partially Met   At present requires Max A as he cannot reach feet to don stockings                Plan - 11/10/19 1000    Clinical Impression Statement Pt has met, or nearly met, all OT goals for  Intensive Phase CDT. Limb volume os markely decreased with elevated level now in proximal thigh. Some mild piting fibrosis remains in ankle. Pt is compliant with all LE self care protocols. He is currently using OTS compression thigh highs along with sequrential pneumatic device  while awaiting fitting of custon compression garments. See OT Rx note dated 11/07/19 for volumetric reduction results.  Pt tolerated MLD to LLE today without increased pain. Cobnt 2 x weekly as per POC.    OT Occupational Profile and History Comprehensive Assessment- Review of records and extensive additional review of physical, cognitive, psychosocial history related to current functional performance    Occupational performance deficits (  Please refer to evaluation for details): ADL's;IADL's;Rest and Sleep;Work;Leisure;Social Participation;Other   body image, role performance   Body Structure / Function / Physical Skills ADL;ROM;Scar mobility;IADL;Edema;Balance;Sensation;Skin integrity;Mobility;Flexibility;Strength;Decreased knowledge of precautions;Gait;Pain;Decreased knowledge of use of DME    Rehab Potential Good    Clinical Decision Making Several treatment options, min-mod task modification necessary    Comorbidities Affecting Occupational Performance: Presence of comorbidities impacting occupational performance    Modification or Assistance to Complete Evaluation  Min-Moderate modification of tasks or assist with assess necessary to complete eval    OT Frequency 2x / week    OT Duration 12 weeks   and PRN   OT Treatment/Interventions Self-care/ADL training;Therapeutic exercise;Functional Mobility Training;Manual Therapy;Coping strategies training;Therapeutic activities;Manual lymph drainage;Energy conservation;DME and/or AE instruction;Compression bandaging;Other (comment);Scar mobilization;Patient/family education   fit with appropriate compression garments once swelling is reduced   Plan Complete Decongestive Therapy  (CDT), Intensive and Management Phases to include  manual lymphatyic drainage (MLD), skin care, therapeutic exercise, compression with short stretch wraps and garments, Pt edu for LE sef care    Consulted and Agree with Plan of Care Patient           Patient will benefit from skilled therapeutic intervention in order to improve the following deficits and impairments:   Body Structure / Function / Physical Skills: ADL, ROM, Scar mobility, IADL, Edema, Balance, Sensation, Skin integrity, Mobility, Flexibility, Strength, Decreased knowledge of precautions, Gait, Pain, Decreased knowledge of use of DME       Visit Diagnosis: Lymphedema, not elsewhere classified    Problem List Patient Active Problem List   Diagnosis Date Noted  . Vitiligo 09/03/2019  . Port-A-Cath in place 08/20/2019  . Encounter for antineoplastic immunotherapy 08/06/2019  . Malignant melanoma of overlapping sites ([place]) 04/23/2019  . Goals of care, counseling/discussion 04/23/2019  . 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 Near syncope 12/17/2018  . Anemia in chronic kidney disease 12/11/2018  . Benign hypertensive kidney disease with chronic kidney disease 12/11/2018  . Stage 3a chronic kidney disease 12/11/2018  . AKI (acute kidney injury) ([place]) 08/24/2018  . Acute hyperkalemia 08/24/2018  . HTN (hypertension) 08/24/2018  . HLD (hyperlipidemia) 08/24/2018  . Seizures ([place]) 08/24/2018  . Complete heart block ([place]) 08/24/2018  . Arthritis of wrist, left 06/12/2018  . Bradycardia 06/04/2018  . 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 Positive ANA (antinuclear antibody) 03/05/2018  . Swelling of joint of left wrist 03/05/2018  . Coronary artery disease involving native coronary artery of native heart 07/09/2017  . Palpitations 07/09/2017  . Stable angina ([place]) 06/27/2017  . Benign essential HTN 05/25/2017  . LBBB (left bundle branch block) 05/25/2017    Andrey Spearman,  MS, OTR/L, [facility] 11/10/19 10:04 AM  [place] MAIN [facility] SERVICES [place], [place], [address] Phone: [phone]   Fax:  [phone]  Name: Edward Pitts MRN: [id number] Date of Birth: November 22, 1960

## 2019-11-11 ENCOUNTER — Encounter: Payer: BC Managed Care – PPO | Admitting: Occupational Therapy

## 2019-11-13 ENCOUNTER — Inpatient Hospital Stay: Payer: 59

## 2019-11-13 ENCOUNTER — Other Ambulatory Visit: Payer: Self-pay

## 2019-11-13 ENCOUNTER — Inpatient Hospital Stay (HOSPITAL_BASED_OUTPATIENT_CLINIC_OR_DEPARTMENT_OTHER): Payer: 59 | Admitting: Oncology

## 2019-11-13 ENCOUNTER — Encounter: Payer: Self-pay | Admitting: Oncology

## 2019-11-13 ENCOUNTER — Ambulatory Visit: Payer: 59 | Admitting: Occupational Therapy

## 2019-11-13 VITALS — BP 133/71 | HR 80 | Temp 97.5°F | Wt 232.4 lb

## 2019-11-13 DIAGNOSIS — Z5112 Encounter for antineoplastic immunotherapy: Secondary | ICD-10-CM

## 2019-11-13 DIAGNOSIS — L80 Vitiligo: Secondary | ICD-10-CM | POA: Diagnosis not present

## 2019-11-13 DIAGNOSIS — C438 Malignant melanoma of overlapping sites of skin: Secondary | ICD-10-CM | POA: Diagnosis not present

## 2019-11-13 LAB — COMPREHENSIVE METABOLIC PANEL
ALT: 34 U/L (ref 0–44)
AST: 26 U/L (ref 15–41)
Albumin: 4.1 g/dL (ref 3.5–5.0)
Alkaline Phosphatase: 81 U/L (ref 38–126)
Anion gap: 7 (ref 5–15)
BUN: 21 mg/dL — ABNORMAL HIGH (ref 6–20)
CO2: 25 mmol/L (ref 22–32)
Calcium: 8.9 mg/dL (ref 8.9–10.3)
Chloride: 109 mmol/L (ref 98–111)
Creatinine, Ser: 1.24 mg/dL (ref 0.61–1.24)
GFR calc Af Amer: >60 mL/min (ref >60)
GFR calc non Af Amer: >60 mL/min (ref >60)
Glucose, Bld: 119 mg/dL — ABNORMAL HIGH (ref 70–99)
Potassium: 3.9 mmol/L (ref 3.5–5.1)
Sodium: 141 mmol/L (ref 135–145)
Total Bilirubin: 0.6 mg/dL (ref 0.3–1.2)
Total Protein: 7.4 g/dL (ref 6.5–8.1)

## 2019-11-13 LAB — CBC WITH DIFFERENTIAL/PLATELET
Abs Immature Granulocytes: 0.04 10*3/uL (ref 0.00–0.07)
Basophils Absolute: 0 10*3/uL (ref 0.0–0.1)
Basophils Relative: 1 %
Eosinophils Absolute: 0.3 10*3/uL (ref 0.0–0.5)
Eosinophils Relative: 6 %
HCT: 33.5 % — ABNORMAL LOW (ref 39.0–52.0)
Hemoglobin: 11.5 g/dL — ABNORMAL LOW (ref 13.0–17.0)
Immature Granulocytes: 1 %
Lymphocytes Relative: 19 %
Lymphs Abs: 0.9 10*3/uL (ref 0.7–4.0)
MCH: 30.4 pg (ref 26.0–34.0)
MCHC: 34.3 g/dL (ref 30.0–36.0)
MCV: 88.6 fL (ref 80.0–100.0)
Monocytes Absolute: 0.4 10*3/uL (ref 0.1–1.0)
Monocytes Relative: 10 %
Neutro Abs: 2.8 10*3/uL (ref 1.7–7.7)
Neutrophils Relative %: 63 %
Platelets: 194 10*3/uL (ref 150–400)
RBC: 3.78 MIL/uL — ABNORMAL LOW (ref 4.22–5.81)
RDW: 13 % (ref 11.5–15.5)
WBC: 4.4 10*3/uL (ref 4.0–10.5)
nRBC: 0 % (ref 0.0–0.2)

## 2019-11-13 MED ORDER — HEPARIN SOD (PORK) LOCK FLUSH 100 UNIT/ML IV SOLN
INTRAVENOUS | Status: AC
  Filled 2019-11-13: qty 5

## 2019-11-13 MED ORDER — HEPARIN SOD (PORK) LOCK FLUSH 100 UNIT/ML IV SOLN
500.0000 [IU] | Freq: Once | INTRAVENOUS | Status: AC
  Administered 2019-11-13: 500 [IU] via INTRAVENOUS
  Filled 2019-11-13: qty 5

## 2019-11-13 MED ORDER — SODIUM CHLORIDE 0.9% FLUSH
10.0000 mL | INTRAVENOUS | Status: DC | PRN
  Administered 2019-11-13: 10 mL via INTRAVENOUS
  Filled 2019-11-13: qty 10

## 2019-11-13 MED ORDER — SODIUM CHLORIDE 0.9 % IV SOLN
240.0000 mg | Freq: Once | INTRAVENOUS | Status: AC
  Administered 2019-11-13: 240 mg via INTRAVENOUS
  Filled 2019-11-13: qty 24

## 2019-11-13 MED ORDER — SODIUM CHLORIDE 0.9 % IV SOLN
Freq: Once | INTRAVENOUS | Status: AC
  Filled 2019-11-13: qty 250

## 2019-11-13 NOTE — Progress Notes (Signed)
Hematology/Oncology follow up  note [facility] [phone] [phone]) [phone]   Patient Care Team: [name], [name], MD as PCP - General (Student)  REFERRING PROVIDER: Cherylann Parr, MD  CHIEF COMPLAINTS/REASON FOR VISIT:  Follow up for melanoma HISTORY OF PRESENTING ILLNESS:   Edward Pitts is a  59 y.o.  male with PMH listed below was seen in consultation at the request of  [name], [name], MD  for evaluation of inguinal mass Patient presented to emergency room 3 days ago complaining about left ing uinal mass discomfort. Reports that he has really noticed the mass growing for the past 1 months. He has a history of left lower extremity melanoma in 2011, status post local excision.  Pain was increased with squatting of laxation. He was advised to take Tylenol for pain. Denies any fever, chills, night sweating.  He does feel mild nauseated. Appetite is fair.  He has lost about 10 pounds since earlier this year. 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  Left inguinal and left iliac nodes which are suspicious for involvement.  There are also 2 small nonspecific hypodense lesions within the right liver, nonspecific.  # patient underwent left groin mass resection On 04/16/2019. Resection pathology showed malignant melanoma, replacing a lymph node, with extracapsular extension, peripheral and deep margins involved.  Left inguinal contents, all 7 lymph nodes were negative for melanoma in the lymph nodes.  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  # PDL1 80% TPS  # 04/16/2019. underwent left groin mass resection   07/07/2019  Status post adjuvant radiation and finished radiation  Patient has Mediport placed to facilitate immunotherapy treatments. INTERVAL HISTORY Edward Pitts is a 59 y.o. male who has above history  reviewed by me today presents for follow up visit for management of inguinal nodal recurrence of melanoma Problems and complaints are listed below: Patient has no new complaints.  Denies shortness of breath, diarrhea, skin rash. Chronic bilateral upper extremity hypopigmentation, unchanged.  . Review of Systems  Constitutional: Negative for appetite change, chills, fatigue, fever and unexpected weight change.  HENT:   Negative for hearing loss and voice change.   Eyes: Negative for eye problems and icterus.  Respiratory: Negative for chest tightness, cough and shortness of breath.   Cardiovascular: Negative for chest pain and leg swelling.  Gastrointestinal: Negative for abdominal distention and abdominal pain.  Endocrine: Negative for hot flashes.  Genitourinary: Negative for difficulty urinating, dysuria and frequency.   Musculoskeletal: Positive for arthralgias.  Skin: Negative for itching and rash.       Skin hypo-pigmentation on upper extremities, no change  Neurological: Negative for light-headedness and numbness.  Hematological: Negative for adenopathy. Does not bruise/bleed easily.  Psychiatric/Behavioral: Negative for confusion.    MEDICAL HISTORY:  Past Medical History:  Diagnosis Date  . Anxiety   . Arthritis   . Complication of anesthesia   . Family history of adverse reaction to anesthesia    PONV mother  . Hyperlipidemia   . Hypertension   . Melanoma ([place]) 2012  . PONV (postoperative nausea and vomiting) 04/16/2019  . Presence of permanent cardiac pacemaker    Medtronic  . Seizures ([place])     SURGICAL HISTORY: Past Surgical History:  Procedure Laterality Date  . KNEE SURGERY Left   . LEFT HEART CATH AND CORONARY ANGIOGRAPHY Left 06/29/2017   Procedure: LEFT  HEART CATH AND CORONARY ANGIOGRAPHY;  Surgeon: [name], MD;  Location: [facility];  Service: Cardiovascular;  Laterality: Left;  . LYMPH NODE DISSECTION Left 04/16/2019   Procedure: Left  inguinal Lymph Node Dissection;  Surgeon: [name], MD;  Location: [facility];  Service: General;  Laterality: Left;  Marland Kitchen MELANOMA EXCISION Left 04/16/2019   Procedure: MELANOMA EXCISION LEFT GROIN MASS;  Surgeon: [name], MD;  Location: [facility];  Service: General;  Laterality: Left;  Marland Kitchen MELANOMA EXCISION WITH SENTINEL LYMPH NODE BIOPSY Left 2012   Left calf   . PACEMAKER INSERTION N/A 08/26/2018   Procedure: INSERTION PACEMAKER;  Surgeon: [name], MD;  Location: [facility];  Service: Cardiovascular;  Laterality: N/A;  . PORTA CATH INSERTION N/A 08/26/2019   Procedure: PORTA CATH INSERTION;  Surgeon: Katha Cabal, MD;  Location: [facility];  Service: Cardiovascular;  Laterality: N/A;  . TEMPORARY PACEMAKER N/A 08/25/2018   Procedure: TEMPORARY PACEMAKER;  Surgeon: [name], MD;  Location: [facility];  Service: Cardiovascular;  Laterality: N/A;    SOCIAL HISTORY: Social History   Socioeconomic History  . Marital status: Married    Spouse name: Vicente Males   . Number of children: 7  . Years of education: 78  . Highest education level: Not on file  Occupational History  . Occupation: [employer]   Tobacco Use  . Smoking status: Never Smoker  . Smokeless tobacco: Never Used  Vaping Use  . Vaping Use: Never used  Substance and Sexual Activity  . Alcohol use: No  . Drug use: No  . Sexual activity: Not on file  Other Topics Concern  . Not on file  Social History Narrative   Lives with mother, wife and sister   Caffeine use: sodas (2 per day)   Social Determinants of Health   Financial Resource Strain: Low Risk   . Difficulty of Paying Living Expenses: Not hard at all  Food Insecurity: No Food Insecurity  . Worried About Charity fundraiser in the Last Year: Never true  . Ran Out of Food in the Last Year: Never true  Transportation Needs: Unmet Transportation Needs  . Lack of Transportation (Medical): Yes  . Lack of Transportation (Non-Medical): Yes   Physical Activity: Unknown  . Days of Exercise per Week: 0 days  . Minutes of Exercise per Session: Not on file  Stress: No Stress Concern Present  . Feeling of Stress : Only a little  Social Connections: Unknown  . Frequency of Communication with Friends and Family: More than three times a week  . Frequency of Social Gatherings with Friends and Family: Not on file  . Attends Religious Services: Not on file  . Active Member of Clubs or Organizations: Not on file  . Attends Archivist Meetings: Not on file  . Marital Status: Married  Human resources officer Violence: Not At Risk  . Fear of Current or Ex-Partner: No  . Emotionally Abused: No  . Physically Abused: No  . Sexually Abused: No    FAMILY HISTORY: Family History  Problem Relation Age of Onset  . Cancer Paternal Grandmother     ALLERGIES:  is allergic to ibuprofen and nsaids.  MEDICATIONS:  Current Outpatient Medications  Medication Sig Dispense Refill  . acetaminophen (TYLENOL) 650 MG CR tablet Take 650 mg by mouth every 8 (eight) hours as needed for pain.     Marland Kitchen allopurinol (ZYLOPRIM) 300 MG tablet Take 300 mg by mouth daily.    Marland Kitchen  aspirin 81 MG chewable tablet Chew 81 mg by mouth daily.    Marland Kitchen atorvastatin (LIPITOR) 20 MG tablet Take 20 mg by mouth daily.    . clonazePAM (KLONOPIN) 0.5 MG tablet 1 tablet in the morning, 2 in the evening (Patient taking differently: Take 0.5 mg by mouth daily. Takes 2 tabs as needed.) 90 tablet 3  . hydrocortisone 2.5 % cream Apply topically daily as needed.    Marland Kitchen ketoconazole (NIZORAL) 2 % cream Apply 1 application topically daily as needed for irritation.    . lidocaine-prilocaine (EMLA) cream Apply 1 application topically as needed. Apply small amount of cream to port site approx 1-2 hours prior to appointment. 30 g 2  . lisinopril (ZESTRIL) 20 MG tablet Take 20 mg by mouth daily.    . metoprolol succinate (TOPROL-XL) 25 MG 24 hr tablet Take 25 mg by mouth daily.     . Multiple  Vitamin (MULTIVITAMIN WITH MINERALS) TABS tablet Take 1 tablet by mouth daily. Centrum Silver    . ondansetron (ZOFRAN) 8 MG tablet Take 1 tablet (8 mg total) by mouth every 8 (eight) hours as needed for nausea, vomiting or refractory nausea / vomiting. 30 tablet 0  . silver sulfADIAZINE (SILVADENE) 1 % cream Apply 1 application topically 2 (two) times daily. (Patient not taking: Reported on 08/06/2019) 50 g 2  . traMADol (ULTRAM) 50 MG tablet Take 1 tablet (50 mg total) by mouth every 6 (six) hours as needed. (Patient not taking: Reported on 08/19/2019) 30 tablet 0   No current facility-administered medications for this visit.   Facility-Administered Medications Ordered in Other Visits  Medication Dose Route Frequency Provider Last Rate Last Admin  . heparin lock flush 100 unit/mL  500 Units Intravenous Once [name], MD      . sodium chloride flush (NS) 0.9 % injection 10 mL  10 mL Intravenous PRN [name], MD         PHYSICAL EXAMINATION: ECOG PERFORMANCE STATUS: 0 - Asymptomatic Vitals:   11/13/19 0849  BP: 133/71  Pulse: 80  Temp: (!) 97.5 F (36.4 C)  SpO2: 98%   Filed Weights   11/13/19 0849  Weight: 232 lb 6.4 oz (105.4 kg)    Physical Exam Constitutional:      General: He is not in acute distress. HENT:     Head: Normocephalic and atraumatic.  Eyes:     General: No scleral icterus.    Pupils: Pupils are equal, round, and reactive to light.  Cardiovascular:     Rate and Rhythm: Normal rate and regular rhythm.     Heart sounds: Normal heart sounds.  Pulmonary:     Effort: Pulmonary effort is normal. No respiratory distress.     Breath sounds: No wheezing.  Abdominal:     General: Bowel sounds are normal. There is no distension.     Palpations: Abdomen is soft. There is no mass.     Tenderness: There is no abdominal tenderness.     Comments:  left groin mass resection and status post radiation.   Musculoskeletal:        General: No deformity. Normal range of  motion.     Cervical back: Normal range of motion and neck supple.     Comments: Left lower extremity edema  Skin:    General: Skin is warm and dry.     Findings: No erythema or rash.  Neurological:     Mental Status: He is alert and oriented to person, place,  and time. Mental status is at baseline.     Cranial Nerves: No cranial nerve deficit.     Coordination: Coordination normal.  Psychiatric:        Mood and Affect: Mood normal.    LABORATORY DATA:  I have reviewed the data as listed Lab Results  Component Value Date   WBC 4.4 10/30/2019   HGB 11.8 (L) 10/30/2019   HCT 34.6 (L) 10/30/2019   MCV 89.4 10/30/2019   PLT 203 10/30/2019   Recent Labs    10/02/19 0841 10/16/19 0834 10/30/19 0807  NA 140 138 136  K 4.9 5.2* 5.2*  CL 106 107 107  CO2 _0 GLUCOSE 117* 110* 118*  BUN 29* 28* 42*  CREATININE 1.37* 1.36* 1.38*  CALCIUM 9.4 9.3 9.1  GFRNONAA 56* 56* 55*  GFRAA >60 >60 >60  PROT 7.6 7.2 7.3  ALBUMIN 4.4 4.3 4.4  AST _1 ALT 37 28 33  ALKPHOS 85 85 88  BILITOT 0.7 0.7 0.5   Iron/TIBC/Ferritin/ %Sat    Component Value Date/Time   IRON 85 10/02/2019 0841   TIBC 342 10/02/2019 0841   FERRITIN 61 10/02/2019 0841   IRONPCTSAT 25 10/02/2019 0841      RADIOGRAPHIC STUDIES: I have personally reviewed the radiological images as listed and agreed with the findings in the report. CT Chest W Contrast  Result Date: 09/10/2019 CLINICAL DATA:  Staging of malignant melanoma, status post left groin mass resection in January. On immunotherapy. Loose stools. Skin melanoma resection in 2012. EXAM: CT CHEST, ABDOMEN, AND PELVIS WITH CONTRAST TECHNIQUE: Multidetector CT imaging of the chest, abdomen and pelvis was performed following the standard protocol during bolus administration of intravenous contrast. CONTRAST:  198m OMNIPAQUE IOHEXOL 300 MG/ML  SOLN COMPARISON:  01/31/2019 CTA of the abdomen and pelvis. PET of 02/11/2019. No prior dedicated chest CTs.  FINDINGS: CT CHEST FINDINGS Cardiovascular: Pacer. Aortic atherosclerosis. Normal heart size, without pericardial effusion. No central pulmonary embolism, on this non-dedicated study. Lad coronary artery calcification. Mediastinum/Nodes: No supraclavicular adenopathy. 8 mm right-sided thyroid nodule. No supraclavicular adenopathy. No mediastinal or hilar adenopathy. Lungs/Pleura: No pleural fluid. No suspicious pulmonary nodule or mass. Musculoskeletal: No acute osseous abnormality. CT ABDOMEN PELVIS FINDINGS Hepatobiliary: Subcentimeter right hepatic lobe cyst. There is also a too small to characterize, 3-4 mm more inferior right hepatic lobe lesion on 60/2. Likely present on the prior PET. Normal gallbladder, without biliary ductal dilatation. Pancreas: Normal, without mass or ductal dilatation. Spleen: Normal in size, without focal abnormality. Adrenals/Urinary Tract: Normal adrenal glands. Lower pole right renal sinus cyst or minimally complex cyst of 1.9 cm. Upper pole right renal subcentimeter lesion is too small to characterize. No hydronephrosis. Normal urinary bladder. Stomach/Bowel: Normal stomach, without wall thickening. Normal colon and terminal ileum. Normal small bowel. Vascular/Lymphatic: Advanced aortic and branch vessel atherosclerosis. The previously described dominant left inguinal mass has been resected, with subcutaneous and intramuscular edema identified. The left external iliac nodes measure maximally 5 mm today on 107/2 versus 8 mm on the prior PET (when remeasured). No pelvic sidewall adenopathy. Reproductive: Normal prostate. Other: No significant free fluid. Moderate size fat containing periumbilical ventral wall hernia. Musculoskeletal: Degenerative partial fusion of the right sacroiliac joint. IMPRESSION: 1. Interval resection of previously described left inguinal mass. The indeterminate left external iliac nodes on prior PET are decreased in size. 2. No evidence of new or progressive  disease. 3. Coronary artery atherosclerosis. Aortic Atherosclerosis (ICD10-I70.0). 4. A too small  to characterize right hepatic lobe low-density lesion is felt to be similar to on the prior PET, not hypermetabolic on that exam. Felt unlikely to represent metastasis but technically indeterminate. Recommend attention on follow-up. 5. 8 mm right thyroid nodule. Not clinically significant; no follow-up imaging recommended (ref: J Am Coll Radiol. 2015 Feb;12(2): 143-50). Electronically Signed   By: Abigail Miyamoto M.D.   On: 09/10/2019 11:08   CT Abdomen Pelvis W Contrast  Result Date: 09/10/2019 CLINICAL DATA:  Staging of malignant melanoma, status post left groin mass resection in January. On immunotherapy. Loose stools. Skin melanoma resection in 2012. EXAM: CT CHEST, ABDOMEN, AND PELVIS WITH CONTRAST TECHNIQUE: Multidetector CT imaging of the chest, abdomen and pelvis was performed following the standard protocol during bolus administration of intravenous contrast. CONTRAST:  165m OMNIPAQUE IOHEXOL 300 MG/ML  SOLN COMPARISON:  01/31/2019 CTA of the abdomen and pelvis. PET of 02/11/2019. No prior dedicated chest CTs. FINDINGS: CT CHEST FINDINGS Cardiovascular: Pacer. Aortic atherosclerosis. Normal heart size, without pericardial effusion. No central pulmonary embolism, on this non-dedicated study. Lad coronary artery calcification. Mediastinum/Nodes: No supraclavicular adenopathy. 8 mm right-sided thyroid nodule. No supraclavicular adenopathy. No mediastinal or hilar adenopathy. Lungs/Pleura: No pleural fluid. No suspicious pulmonary nodule or mass. Musculoskeletal: No acute osseous abnormality. CT ABDOMEN PELVIS FINDINGS Hepatobiliary: Subcentimeter right hepatic lobe cyst. There is also a too small to characterize, 3-4 mm more inferior right hepatic lobe lesion on 60/2. Likely present on the prior PET. Normal gallbladder, without biliary ductal dilatation. Pancreas: Normal, without mass or ductal dilatation.  Spleen: Normal in size, without focal abnormality. Adrenals/Urinary Tract: Normal adrenal glands. Lower pole right renal sinus cyst or minimally complex cyst of 1.9 cm. Upper pole right renal subcentimeter lesion is too small to characterize. No hydronephrosis. Normal urinary bladder. Stomach/Bowel: Normal stomach, without wall thickening. Normal colon and terminal ileum. Normal small bowel. Vascular/Lymphatic: Advanced aortic and branch vessel atherosclerosis. The previously described dominant left inguinal mass has been resected, with subcutaneous and intramuscular edema identified. The left external iliac nodes measure maximally 5 mm today on 107/2 versus 8 mm on the prior PET (when remeasured). No pelvic sidewall adenopathy. Reproductive: Normal prostate. Other: No significant free fluid. Moderate size fat containing periumbilical ventral wall hernia. Musculoskeletal: Degenerative partial fusion of the right sacroiliac joint. IMPRESSION: 1. Interval resection of previously described left inguinal mass. The indeterminate left external iliac nodes on prior PET are decreased in size. 2. No evidence of new or progressive disease. 3. Coronary artery atherosclerosis. Aortic Atherosclerosis (ICD10-I70.0). 4. A too small to characterize right hepatic lobe low-density lesion is felt to be similar to on the prior PET, not hypermetabolic on that exam. Felt unlikely to represent metastasis but technically indeterminate. Recommend attention on follow-up. 5. 8 mm right thyroid nodule. Not clinically significant; no follow-up imaging recommended (ref: J Am Coll Radiol. 2015 Feb;12(2): 143-50). Electronically Signed   By: KAbigail MiyamotoM.D.   On: 09/10/2019 11:08   PERIPHERAL VASCULAR CATHETERIZATION  Result Date: 08/26/2019 See op note      ASSESSMENT & PLAN:  1. [place]   2. Encounter for antineoplastic immunotherapy   3. Vitiligo    #Left inguinal nodal recurrence of  melanoma.  Status post resection and adjuvant radiation. Labs reviewed and discussed with patient. His counts acceptable to proceed with nivolumab treatment today.   #Vitiligo, stable. . # Hyperkalemia, potassium has normalized. #Normocytic anemia, stable.  Monitor. #[facility] arthritis [facility]. Recommend him to discuss with surgeon whether left inguinal lymph node dissection may affect his wound healing. He has lymphedema.  All questions were answered. The patient knows to call the clinic with any problems questions or concerns.  Follow-up with 2 weeks. We spent sufficient time to discuss many aspect of care, questions were answered to patient's satisfaction.   [name], MD, PhD Hematology Oncology [facility] Pager- [phone] 11/13/2019

## 2019-11-13 NOTE — Progress Notes (Signed)
Pt here for follow up. No new concerns voiced.   

## 2019-11-14 ENCOUNTER — Ambulatory Visit: Payer: 59 | Admitting: Occupational Therapy

## 2019-11-14 DIAGNOSIS — I890 Lymphedema, not elsewhere classified: Secondary | ICD-10-CM | POA: Diagnosis not present

## 2019-11-14 NOTE — Therapy (Signed)
[place] MAIN [facility] SERVICES [address] [place], [place], [address] Phone: [phone]   Fax:  [phone]  Occupational Therapy Treatment  Patient Details  Name: Edward Pitts MRN: [id number] Date of Birth: 1960-05-03 Referring Provider (OT): Edward Server, MD   Encounter Date: 11/14/2019   OT End of Session - 11/14/19 1210    Visit Number 31    Number of Visits 36    Date for OT Re-Evaluation 02/03/20    OT Start Time 0900    OT Stop Time 1005    OT Time Calculation (min) 65 min    Activity Tolerance Patient tolerated treatment well;No increased pain    Behavior During Therapy [facility] for tasks assessed/performed           Past Medical History:  Diagnosis Date   Anxiety    Arthritis    Complication of anesthesia    Family history of adverse reaction to anesthesia    PONV mother   Hyperlipidemia    Hypertension    Melanoma ([place]) 2012   PONV (postoperative nausea and vomiting) 04/16/2019   Presence of permanent cardiac pacemaker    Medtronic   Seizures [facility])     Past Surgical History:  Procedure Laterality Date   KNEE SURGERY Left    LEFT HEART CATH AND CORONARY ANGIOGRAPHY Left 06/29/2017   Procedure: LEFT HEART CATH AND CORONARY ANGIOGRAPHY;  Surgeon: Edward Skains, MD;  Location: [place] [facility];  Service: Cardiovascular;  Laterality: Left;   LYMPH NODE DISSECTION Left 04/16/2019   Procedure: Left inguinal Lymph Node Dissection;  Surgeon: Edward Klein, MD;  Location: [place];  Service: General;  Laterality: Left;   MELANOMA EXCISION Left 04/16/2019   Procedure: MELANOMA EXCISION LEFT GROIN MASS;  Surgeon: Edward Klein, MD;  Location: [place];  Service: General;  Laterality: Left;   MELANOMA EXCISION WITH SENTINEL LYMPH NODE BIOPSY Left 2012   Left calf    PACEMAKER INSERTION N/A 08/26/2018   Procedure: INSERTION PACEMAKER;  Surgeon: Edward Cowman, MD;  Location: [facility];  Service: Cardiovascular;   Laterality: N/A;   PORTA CATH INSERTION N/A 08/26/2019   Procedure: PORTA CATH INSERTION;  Surgeon: Edward Cabal, MD;  Location: [place] [facility];  Service: Cardiovascular;  Laterality: N/A;   TEMPORARY PACEMAKER N/A 08/25/2018   Procedure: TEMPORARY PACEMAKER;  Surgeon: Edward Mocha, MD;  Location: [place] [facility];  Service: Cardiovascular;  Laterality: N/A;    There were no vitals filed for this visit.   Subjective Assessment - 11/14/19 3235    Subjective  Edward Pitts presents to OT for visit 31/36 to address LLE lymphedema 2/2 melanoma Rx. Edward Pitts presents wearing off the shelf compression garment he purchased on [employer]. 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 Pt denies LE-related pain this morning.    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 w/ LN disection (-7/7 LN); Completed adjuvant XRT 07/07/2019; 08/2018 Pacemaker placed; L knee sx date?;    Limitations chronic LLE knee pain; post surgical LLE pain,  chronic LLE/LLQ swelling, decreased LLE strength, impaired gait, decreased dynamic balance, altered sensation LLE, increased risk of infection, decreased standing and walking tolerance > 1 hr; decreased ankle AROM    Repetition Increases Symptoms    Special Tests +Stemmer base L toes    Patient Stated Goals reduce swelling to normal and keep it from getting worse    Pain Onset More than a  month ago                                     OT Long Term Goals - 11/10/19 0957      OT LONG TERM GOAL #1   Title Pt will be able to apply thigh length, multi-layer, short stretch compression wraps daily to single limb using correct gradient techniques with Maximum assistance to achieve optimal limb volume reduction, to return affected limb/s, as closely as possible, to premorbid size and shape, to limit infection risk, and to improve safe functional ambulation and mobility.    Status Achieved   Pt unable  to bend left knee to extent needed to appy compression garments independently. Cargiver is independent with gradient techniques.     OT LONG TERM GOAL #2   Title Pt will be able to verbalize signs and symptoms of cellulitis infection and identify at least 4 common lymphedema precautions using printed resource for reference to limit LE progression over time to limit risk of infection and LE exacerbation.    Status Achieved      OT LONG TERM GOAL #3   Status Achieved      OT LONG TERM GOAL #4   Status Achieved   achieved this date.. Goal met for LLE     OT LONG TERM GOAL #5   Status Achieved      OT LONG TERM GOAL #6   Status Partially Met   At present requires Max A as he cannot reach feet to don stockings                Plan - 11/14/19 1210    Clinical Impression Statement Pt tolerated MLD without increased pain. Provided skilled EDU for donning compression stocking using friction gloves and ripstop nylon sock. Max A to don garment. Pt able to position garment at groin vs mid thigh after training. Cont as per POC. Custom garment is ordered.    OT Occupational Profile and History Comprehensive Assessment- Review of records and extensive additional review of physical, cognitive, psychosocial history related to current functional performance    Occupational performance deficits (Please refer to evaluation for details): ADL's;IADL's;Rest and Sleep;Work;Leisure;Social Participation;Other   body image, role performance   Body Structure / Function / Physical Skills ADL;ROM;Scar mobility;IADL;Edema;Balance;Sensation;Skin integrity;Mobility;Flexibility;Strength;Decreased knowledge of precautions;Gait;Pain;Decreased knowledge of use of DME    Rehab Potential Good    Clinical Decision Making Several treatment options, min-mod task modification necessary    Comorbidities Affecting Occupational Performance: Presence of comorbidities impacting occupational performance    Modification or  Assistance to Complete Evaluation  Min-Moderate modification of tasks or assist with assess necessary to complete eval    OT Frequency 2x / week    OT Duration 12 weeks   and PRN   OT Treatment/Interventions Self-care/ADL training;Therapeutic exercise;Functional Mobility Training;Manual Therapy;Coping strategies training;Therapeutic activities;Manual lymph drainage;Energy conservation;DME and/or AE instruction;Compression bandaging;Other (comment);Scar mobilization;Patient/family education   fit with appropriate compression garments once swelling is reduced   Plan Complete Decongestive Therapy (CDT), Intensive and Management Phases to include  manual lymphatyic drainage (MLD), skin care, therapeutic exercise, compression with short stretch wraps and garments, Pt edu for LE sef care    Consulted and Agree with Plan of Care Patient           Patient will benefit from skilled therapeutic intervention in order to improve the following deficits and impairments:   Body Structure /  Function / Physical Skills: ADL, ROM, Scar mobility, IADL, Edema, Balance, Sensation, Skin integrity, Mobility, Flexibility, Strength, Decreased knowledge of precautions, Gait, Pain, Decreased knowledge of use of DME       Visit Diagnosis: Lymphedema, not elsewhere classified    Problem List Patient Active Problem List   Diagnosis Date Noted   Vitiligo 09/03/2019   Port-A-Cath in place 08/20/2019   Encounter for antineoplastic immunotherapy 08/06/2019   Malignant melanoma of overlapping sites ([place]) 04/23/2019   Goals of care, counseling/discussion 04/23/2019   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[place]) 04/16/2019   Near syncope 12/17/2018   Anemia in chronic kidney disease 12/11/2018   Benign hypertensive kidney disease with chronic kidney disease 12/11/2018   Stage 3a chronic kidney disease 12/11/2018   AKI (acute kidney injury) ([place]) 08/24/2018   Acute hyperkalemia 08/24/2018   HTN  (hypertension) 08/24/2018   HLD (hyperlipidemia) 08/24/2018   Seizures ([place]) 08/24/2018   Complete heart block ([place]) 08/24/2018   Arthritis of wrist, left 06/12/2018   Bradycardia 06/04/2018   Chronic gouty arthropathy without tophi 03/13/2018   Positive ANA (antinuclear antibody) 03/05/2018   Swelling of joint of left wrist 03/05/2018   Coronary artery disease involving native coronary artery of native heart 07/09/2017   Palpitations 07/09/2017   Stable angina ([place]) 06/27/2017   Benign essential HTN 05/25/2017   LBBB (left bundle branch block) 05/25/2017    Andrey Spearman, MS, OTR/L, CLT-LANA 11/14/19 12:11 PM  [place] MAIN [facility] SERVICES [place], [place], [address] Phone: [phone]   Fax:  [phone]  Name: Edward Pitts MRN: [id number] Date of Birth: [birth year]

## 2019-11-17 ENCOUNTER — Other Ambulatory Visit: Payer: Self-pay

## 2019-11-17 ENCOUNTER — Ambulatory Visit: Payer: 59 | Admitting: Occupational Therapy

## 2019-11-17 DIAGNOSIS — I890 Lymphedema, not elsewhere classified: Secondary | ICD-10-CM

## 2019-11-17 NOTE — Therapy (Signed)
[place] MAIN [facility] SERVICES [address] [place], [place], [address] Phone: [phone]   Fax:  [phone]  Occupational Therapy Treatment  Patient Details  Name: Edward Pitts MRN: [id number] Date of Birth: 01-27-1961 Referring Provider (OT): [name], MD   Encounter Date: 11/17/2019   OT End of Session - 11/17/19 0858    Visit Number 32    Number of Visits 36    Date for OT Re-Evaluation 02/03/20    OT Start Time 0803    OT Stop Time 0900    OT Time Calculation (min) 57 min    Activity Tolerance Patient tolerated treatment well;No increased pain    Behavior During Therapy WFL for tasks assessed/performed           Past Medical History:  Diagnosis Date  . Anxiety   . Arthritis   . Complication of anesthesia   . Family history of adverse reaction to anesthesia    PONV mother  . Hyperlipidemia   . Hypertension   . Melanoma ([place]) 2012  . PONV (postoperative nausea and vomiting) 04/16/2019  . Presence of permanent cardiac pacemaker    Medtronic  . Seizures ([place])     Past Surgical History:  Procedure Laterality Date  . KNEE SURGERY Left   . LEFT HEART CATH AND CORONARY ANGIOGRAPHY Left 06/29/2017   Procedure: LEFT HEART CATH AND CORONARY ANGIOGRAPHY;  Surgeon: [name], MD;  Location: [place] [facility];  Service: Cardiovascular;  Laterality: Left;  . LYMPH NODE DISSECTION Left 04/16/2019   Procedure: Left inguinal Lymph Node Dissection;  Surgeon: [name], MD;  Location: [place];  Service: General;  Laterality: Left;  Marland Kitchen MELANOMA EXCISION Left 04/16/2019   Procedure: MELANOMA EXCISION LEFT GROIN MASS;  Surgeon: [name], MD;  Location: [place];  Service: General;  Laterality: Left;  Marland Kitchen MELANOMA EXCISION WITH SENTINEL LYMPH NODE BIOPSY Left 2012   Left calf   . PACEMAKER INSERTION N/A 08/26/2018   Procedure: INSERTION PACEMAKER;  Surgeon: [name], MD;  Location: [facility];  Service: Cardiovascular;   Laterality: N/A;  . PORTA CATH INSERTION N/A 08/26/2019   Procedure: PORTA CATH INSERTION;  Surgeon: Katha Cabal, MD;  Location: [place] [facility];  Service: Cardiovascular;  Laterality: N/A;  . TEMPORARY PACEMAKER N/A 08/25/2018   Procedure: TEMPORARY PACEMAKER;  Surgeon: [name], MD;  Location: [place] [facility];  Service: Cardiovascular;  Laterality: N/A;    There were no vitals filed for this visit.   Subjective Assessment - 11/17/19 0814    Subjective  Edward Pitts presents to OT for visit 32/36 to address LLE lymphedema 2/2 melanoma Rx. Edward Pitts presents wearing off the shelf compression garment he purchased on [employer]. 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 Pt denies LE-related pain again this morning.    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 w/ LN disection (-7/7 LN); Completed adjuvant XRT 07/07/2019; 08/2018 Pacemaker placed; L knee sx date?;    Limitations chronic LLE knee pain; post surgical LLE pain,  chronic LLE/LLQ swelling, decreased LLE strength, impaired gait, decreased dynamic balance, altered sensation LLE, increased risk of infection, decreased standing and walking tolerance > 1 hr; decreased ankle AROM    Repetition Increases Symptoms    Special Tests +Stemmer base L toes    Patient Stated Goals reduce swelling to normal and keep it from getting worse    Pain Onset More than  a month ago                        OT Treatments/Exercises (OP) - 11/17/19 0001      ADLs   ADL Education Given Yes      Manual Therapy   Manual Therapy Edema management    Manual Lymphatic Drainage (MLD) MLDto LLE/LLQ in supine and sidelying utilizing short neck sequence, deep abdominals with diaphragmatic breathing, ipsilateral inguinal -axiillary anastomosis and proximal to distal leg sequence.     Compression Bandaging Max A to don OTS thigh length compression stocking using AE                        OT Long Term Goals - 11/10/19 0957      OT LONG TERM GOAL #1   Title Pt will be able to apply thigh length, multi-layer, short stretch compression wraps daily to single limb using correct gradient techniques with Maximum assistance to achieve optimal limb volume reduction, to return affected limb/s, as closely as possible, to premorbid size and shape, to limit infection risk, and to improve safe functional ambulation and mobility.    Status Achieved   Pt unable to bend left knee to extent needed to appy compression garments independently. Cargiver is independent with gradient techniques.     OT LONG TERM GOAL #2   Title Pt will be able to verbalize signs and symptoms of cellulitis infection and identify at least 4 common lymphedema precautions using printed resource for reference to limit LE progression over time to limit risk of infection and LE exacerbation.    Status Achieved      OT LONG TERM GOAL #3   Status Achieved      OT LONG TERM GOAL #4   Status Achieved   achieved this date.. Goal met for LLE     OT LONG TERM GOAL #5   Status Achieved      OT LONG TERM GOAL #6   Status Partially Met   At present requires Max A as he cannot reach feet to don stockings                Plan - 11/17/19 0859    Clinical Impression Statement Pt diligently compliant w  OTS compression garment while awaiting delivery of custon, fat knit stocking. Mild swelling observed at proximal thigh a above top band. Mild well tolerated without pain. Max A to don thigh high after manual therapy. Cont as per POC.    OT Occupational Profile and History Comprehensive Assessment- Review of records and extensive additional review of physical, cognitive, psychosocial history related to current functional performance    Occupational performance deficits (Please refer to evaluation for details): ADL's;IADL's;Rest and Sleep;Work;Leisure;Social Participation;Other   body image, role  performance   Body Structure / Function / Physical Skills ADL;ROM;Scar mobility;IADL;Edema;Balance;Sensation;Skin integrity;Mobility;Flexibility;Strength;Decreased knowledge of precautions;Gait;Pain;Decreased knowledge of use of DME    Rehab Potential Good    Clinical Decision Making Several treatment options, min-mod task modification necessary    Comorbidities Affecting Occupational Performance: Presence of comorbidities impacting occupational performance    Modification or Assistance to Complete Evaluation  Min-Moderate modification of tasks or assist with assess necessary to complete eval    OT Frequency 2x / week    OT Duration 12 weeks   and PRN   OT Treatment/Interventions Self-care/ADL training;Therapeutic exercise;Functional Mobility Training;Manual Therapy;Coping strategies training;Therapeutic activities;Manual lymph drainage;Energy conservation;DME and/or AE instruction;Compression bandaging;Other (comment);Scar  mobilization;Patient/family education   fit with appropriate compression garments once swelling is reduced   Plan Complete Decongestive Therapy (CDT), Intensive and Management Phases to include  manual lymphatyic drainage (MLD), skin care, therapeutic exercise, compression with short stretch wraps and garments, Pt edu for LE sef care    Consulted and Agree with Plan of Care Patient           Patient will benefit from skilled therapeutic intervention in order to improve the following deficits and impairments:   Body Structure / Function / Physical Skills: ADL, ROM, Scar mobility, IADL, Edema, Balance, Sensation, Skin integrity, Mobility, Flexibility, Strength, Decreased knowledge of precautions, Gait, Pain, Decreased knowledge of use of DME       Visit Diagnosis: Lymphedema, not elsewhere classified    Problem List Patient Active Problem List   Diagnosis Date Noted  . Vitiligo 09/03/2019  . Port-A-Cath in place 08/20/2019  . Encounter for antineoplastic  immunotherapy 08/06/2019  . Malignant melanoma of overlapping sites ([place]) 04/23/2019  . Goals of care, counseling/discussion 04/23/2019  . 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 Near syncope 12/17/2018  . Anemia in chronic kidney disease 12/11/2018  . Benign hypertensive kidney disease with chronic kidney disease 12/11/2018  . Stage 3a chronic kidney disease 12/11/2018  . AKI (acute kidney injury) ([place]) 08/24/2018  . Acute hyperkalemia 08/24/2018  . HTN (hypertension) 08/24/2018  . HLD (hyperlipidemia) 08/24/2018  . Seizures ([place]) 08/24/2018  . Complete heart block ([place]) 08/24/2018  . Arthritis of wrist, left 06/12/2018  . Bradycardia 06/04/2018  . 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 Positive ANA (antinuclear antibody) 03/05/2018  . Swelling of joint of left wrist 03/05/2018  . Coronary artery disease involving native coronary artery of native heart 07/09/2017  . Palpitations 07/09/2017  . Stable angina ([place]) 06/27/2017  . Benign essential HTN 05/25/2017  . LBBB (left bundle branch block) 05/25/2017   Andrey Spearman, MS, OTR/L, [facility] 11/17/19 9:05 AM   [place] MAIN [facility] SERVICES [address] [place], [place], [address] Phone: [phone]   Fax:  [phone]  Name: Rolla Kedzierski MRN: [id number] Date of Birth: [birth year]

## 2019-11-18 ENCOUNTER — Ambulatory Visit: Payer: 59 | Admitting: Occupational Therapy

## 2019-11-19 ENCOUNTER — Other Ambulatory Visit: Payer: Self-pay

## 2019-11-19 ENCOUNTER — Ambulatory Visit: Payer: 59 | Admitting: Occupational Therapy

## 2019-11-19 DIAGNOSIS — I890 Lymphedema, not elsewhere classified: Secondary | ICD-10-CM | POA: Diagnosis not present

## 2019-11-19 NOTE — Therapy (Signed)
[place] MAIN [facility] SERVICES [address] [place], [place], [address] Phone: [phone]   Fax:  [phone]  Occupational Therapy Treatment  Patient Details  Name: Edward Pitts MRN: [id number] Date of Birth: March 01, 1961 Referring Provider (OT): [name], MD   Encounter Date: 11/19/2019   OT End of Session - 11/19/19 1253    Visit Number 33    Number of Visits 36    Date for OT Re-Evaluation 02/03/20    OT Start Time 0802    OT Stop Time 0902    OT Time Calculation (min) 60 min    Activity Tolerance Patient tolerated treatment well;No increased pain    Behavior During Therapy WFL for tasks assessed/performed           Past Medical History:  Diagnosis Date  . Anxiety   . Arthritis   . Complication of anesthesia   . Family history of adverse reaction to anesthesia    PONV mother  . Hyperlipidemia   . Hypertension   . Melanoma ([place]) 2012  . PONV (postoperative nausea and vomiting) 04/16/2019  . Presence of permanent cardiac pacemaker    Medtronic  . Seizures ([place])     Past Surgical History:  Procedure Laterality Date  . KNEE SURGERY Left   . LEFT HEART CATH AND CORONARY ANGIOGRAPHY Left 06/29/2017   Procedure: LEFT HEART CATH AND CORONARY ANGIOGRAPHY;  Surgeon: [name], MD;  Location: [place] [facility];  Service: Cardiovascular;  Laterality: Left;  . LYMPH NODE DISSECTION Left 04/16/2019   Procedure: Left inguinal Lymph Node Dissection;  Surgeon: [name], MD;  Location: [place];  Service: General;  Laterality: Left;  Marland Kitchen MELANOMA EXCISION Left 04/16/2019   Procedure: MELANOMA EXCISION LEFT GROIN MASS;  Surgeon: [name], MD;  Location: [place];  Service: General;  Laterality: Left;  Marland Kitchen MELANOMA EXCISION WITH SENTINEL LYMPH NODE BIOPSY Left 2012   Left calf   . PACEMAKER INSERTION N/A 08/26/2018   Procedure: INSERTION PACEMAKER;  Surgeon: [name], MD;  Location: [facility];  Service: Cardiovascular;   Laterality: N/A;  . PORTA CATH INSERTION N/A 08/26/2019   Procedure: PORTA CATH INSERTION;  Surgeon: Katha Cabal, MD;  Location: [place] [facility];  Service: Cardiovascular;  Laterality: N/A;  . TEMPORARY PACEMAKER N/A 08/25/2018   Procedure: TEMPORARY PACEMAKER;  Surgeon: [name], MD;  Location: [place] [facility];  Service: Cardiovascular;  Laterality: N/A;    There were no vitals filed for this visit.   Subjective Assessment - 11/19/19 1250    Subjective  Edward Pitts presents to OT for visit 33/36 to address LLE lymphedema 2/2 melanoma Rx. Edward Pitts presents wearing off the shelf compression garment he purchased on [employer]. 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 w/ LN disection (-7/7 LN); Completed adjuvant XRT 07/07/2019; 08/2018 Pacemaker placed; L knee sx date?;    Limitations chronic LLE knee pain; post surgical LLE pain,  chronic LLE/LLQ swelling, decreased LLE strength, impaired gait, decreased dynamic balance, altered sensation LLE, increased risk of infection, decreased standing and walking tolerance > 1 hr; decreased ankle AROM    Repetition Increases Symptoms    Special Tests +Stemmer base L toes    Patient Stated Goals reduce swelling to normal and keep it from getting worse    Pain Onset More than a month  ago                        OT Treatments/Exercises (OP) - 11/19/19 0001      ADLs   ADL Education Given Yes      Manual Therapy   Manual Therapy Edema management;Manual Lymphatic Drainage (MLD);Compression Bandaging    Manual Lymphatic Drainage (MLD) MLDto LLE/LLQ in supine and sidelying utilizing short neck sequence, deep abdominals with diaphragmatic breathing, ipsilateral inguinal -axiillary anastomosis and proximal to distal leg sequence.     Compression Bandaging Max A to don OTS thigh length compression stocking  using AE                  OT Education - 11/19/19 1252    Education Details Continued skilled Pt/caregiver education  And LE ADL training throughout visit for lymphedema self care/ home program, including compression wrapping, compression garment and device wear/care, lymphatic pumping ther ex, simple self-MLD, and skin care. Discussed progress towards goals.    Person(s) Educated Patient;Spouse    Methods Explanation;Demonstration;Handout    Comprehension Verbalized understanding;Returned demonstration;Need further instruction               OT Long Term Goals - 11/10/19 0957      OT LONG TERM GOAL #1   Title Pt will be able to apply thigh length, multi-layer, short stretch compression wraps daily to single limb using correct gradient techniques with Maximum assistance to achieve optimal limb volume reduction, to return affected limb/s, as closely as possible, to premorbid size and shape, to limit infection risk, and to improve safe functional ambulation and mobility.    Status Achieved   Pt unable to bend left knee to extent needed to appy compression garments independently. Cargiver is independent with gradient techniques.     OT LONG TERM GOAL #2   Title Pt will be able to verbalize signs and symptoms of cellulitis infection and identify at least 4 common lymphedema precautions using printed resource for reference to limit LE progression over time to limit risk of infection and LE exacerbation.    Status Achieved      OT LONG TERM GOAL #3   Status Achieved      OT LONG TERM GOAL #4   Status Achieved   achieved this date.. Goal met for LLE     OT LONG TERM GOAL #5   Status Achieved      OT LONG TERM GOAL #6   Status Partially Met   At present requires Max A as he cannot reach feet to don stockings                Plan - 11/19/19 1256    Clinical Impression Statement Pt tolerated MLD and skin care without increased pain. Max A to don garment after MLD to  conserve Rx time. Cont as per POC. Custom garment is ordered. Pt agrees with plan to reduce Rx frequency to f/u after fitting is complete.    OT Occupational Profile and History Comprehensive Assessment- Review of records and extensive additional review of physical, cognitive, psychosocial history related to current functional performance    Occupational performance deficits (Please refer to evaluation for details): ADL's;IADL's;Rest and Sleep;Work;Leisure;Social Participation;Other   body image, role performance   Body Structure / Function / Physical Skills ADL;ROM;Scar mobility;IADL;Edema;Balance;Sensation;Skin integrity;Mobility;Flexibility;Strength;Decreased knowledge of precautions;Gait;Pain;Decreased knowledge of use of DME    Rehab Potential Good    Clinical Decision Making Several treatment options, min-mod task  modification necessary    Comorbidities Affecting Occupational Performance: Presence of comorbidities impacting occupational performance    Modification or Assistance to Complete Evaluation  Min-Moderate modification of tasks or assist with assess necessary to complete eval    OT Frequency 2x / week    OT Duration 12 weeks   and PRN   OT Treatment/Interventions Self-care/ADL training;Therapeutic exercise;Functional Mobility Training;Manual Therapy;Coping strategies training;Therapeutic activities;Manual lymph drainage;Energy conservation;DME and/or AE instruction;Compression bandaging;Other (comment);Scar mobilization;Patient/family education   fit with appropriate compression garments once swelling is reduced   Plan Complete Decongestive Therapy (CDT), Intensive and Management Phases to include  manual lymphatyic drainage (MLD), skin care, therapeutic exercise, compression with short stretch wraps and garments, Pt edu for LE sef care    Consulted and Agree with Plan of Care Patient           Patient will benefit from skilled therapeutic intervention in order to improve the  following deficits and impairments:   Body Structure / Function / Physical Skills: ADL, ROM, Scar mobility, IADL, Edema, Balance, Sensation, Skin integrity, Mobility, Flexibility, Strength, Decreased knowledge of precautions, Gait, Pain, Decreased knowledge of use of DME       Visit Diagnosis: Lymphedema, not elsewhere classified    Problem List Patient Active Problem List   Diagnosis Date Noted  . Vitiligo 09/03/2019  . Port-A-Cath in place 08/20/2019  . Encounter for antineoplastic immunotherapy 08/06/2019  . Malignant melanoma of overlapping sites ([place]) 04/23/2019  . Goals of care, counseling/discussion 04/23/2019  . 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 Near syncope 12/17/2018  . Anemia in chronic kidney disease 12/11/2018  . Benign hypertensive kidney disease with chronic kidney disease 12/11/2018  . Stage 3a chronic kidney disease 12/11/2018  . AKI (acute kidney injury) ([place]) 08/24/2018  . Acute hyperkalemia 08/24/2018  . HTN (hypertension) 08/24/2018  . HLD (hyperlipidemia) 08/24/2018  . Seizures ([place]) 08/24/2018  . Complete heart block ([place]) 08/24/2018  . Arthritis of wrist, left 06/12/2018  . Bradycardia 06/04/2018  . 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 Positive ANA (antinuclear antibody) 03/05/2018  . Swelling of joint of left wrist 03/05/2018  . Coronary artery disease involving native coronary artery of native heart 07/09/2017  . Palpitations 07/09/2017  . Stable angina ([place]) 06/27/2017  . Benign essential HTN 05/25/2017  . LBBB (left bundle branch block) 05/25/2017    Andrey Spearman, MS, OTR/L, [facility] 11/19/19 12:59 PM  [place] MAIN [facility] SERVICES [address] [place], [place], [address] Phone: [phone]   Fax:  [phone]  Name: Edward Pitts MRN: [id number] Date of Birth: November 27, 1960

## 2019-11-20 ENCOUNTER — Other Ambulatory Visit: Payer: 59

## 2019-11-20 ENCOUNTER — Ambulatory Visit: Payer: 59

## 2019-11-20 ENCOUNTER — Ambulatory Visit: Payer: 59 | Admitting: Oncology

## 2019-11-20 ENCOUNTER — Ambulatory Visit: Payer: 59 | Admitting: Occupational Therapy

## 2019-11-25 ENCOUNTER — Other Ambulatory Visit: Payer: Self-pay

## 2019-11-25 ENCOUNTER — Ambulatory Visit: Payer: 59 | Admitting: Occupational Therapy

## 2019-11-25 DIAGNOSIS — I890 Lymphedema, not elsewhere classified: Secondary | ICD-10-CM

## 2019-11-26 NOTE — Therapy (Signed)
[place] MAIN [facility] SERVICES [address] [place], [place], [address] Phone: [phone]   Fax:  [phone]  Occupational Therapy Treatment  Patient Details  Name: Edward Pitts MRN: [id number] Date of Birth: March 22, 1961 Referring Provider (OT): [name], MD   Encounter Date: 11/25/2019   OT End of Session - 11/25/19 1307    Visit Number 34    Number of Visits 36    Date for OT Re-Evaluation 02/03/20    OT Start Time 0104    Activity Tolerance Patient tolerated treatment well;No increased pain    Behavior During Therapy WFL for tasks assessed/performed           Past Medical History:  Diagnosis Date  . Anxiety   . Arthritis   . Complication of anesthesia   . Family history of adverse reaction to anesthesia    PONV mother  . Hyperlipidemia   . Hypertension   . Melanoma ([place]) 2012  . PONV (postoperative nausea and vomiting) 04/16/2019  . Presence of permanent cardiac pacemaker    [employer]tronic  . Seizures ([place])     Past Surgical History:  Procedure Laterality Date  . KNEE SURGERY Left   . LEFT HEART CATH AND CORONARY ANGIOGRAPHY Left 06/29/2017   Procedure: LEFT HEART CATH AND CORONARY ANGIOGRAPHY;  Surgeon: [name], MD;  Location: [place] [facility];  Service: Cardiovascular;  Laterality: Left;  . LYMPH NODE DISSECTION Left 04/16/2019   Procedure: Left inguinal Lymph Node Dissection;  Surgeon: [name], MD;  Location: [place];  Service: General;  Laterality: Left;  Marland Kitchen MELANOMA EXCISION Left 04/16/2019   Procedure: MELANOMA EXCISION LEFT GROIN MASS;  Surgeon: [name], MD;  Location: [place];  Service: General;  Laterality: Left;  Marland Kitchen MELANOMA EXCISION WITH SENTINEL LYMPH NODE BIOPSY Left 2012   Left calf   . PACEMAKER INSERTION N/A 08/26/2018   Procedure: INSERTION PACEMAKER;  Surgeon: [name], MD;  Location: [facility];  Service: Cardiovascular;  Laterality: N/A;  . PORTA CATH INSERTION N/A 08/26/2019    Procedure: PORTA CATH INSERTION;  Surgeon: Katha Cabal, MD;  Location: [place] [facility];  Service: Cardiovascular;  Laterality: N/A;  . TEMPORARY PACEMAKER N/A 08/25/2018   Procedure: TEMPORARY PACEMAKER;  Surgeon: [name], MD;  Location: [place] [facility];  Service: Cardiovascular;  Laterality: N/A;    There were no vitals filed for this visit.   Subjective Assessment - 11/25/19 1300    Subjective  Edward Pitts presents to OT for visit 34/36 to address LLE lymphedema 2/2 melanoma Rx. Edward Pitts presents wearing off the shelf compression garment he purchased on [employer]. 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 w/ LN disection (-7/7 LN); Completed adjuvant XRT 07/07/2019; 08/2018 Pacemaker placed; L knee sx date?;    Limitations chronic LLE knee pain; post surgical LLE pain,  chronic LLE/LLQ swelling, decreased LLE strength, impaired gait, decreased dynamic balance, altered sensation LLE, increased risk of infection, decreased standing and walking tolerance > 1 hr; decreased ankle AROM    Repetition Increases Symptoms    Special Tests +Stemmer base L toes    Patient Stated Goals reduce swelling to normal and keep it from getting worse    Pain Onset More than a month ago  OT Education - 11/25/19 1307    Education Details Continued skilled Pt/caregiver education  And LE ADL training throughout visit for lymphedema self care/ home program, including compression wrapping, compression garment and device wear/care, lymphatic pumping ther ex, simple self-MLD, and skin care. Discussed progress towards goals.    Person(s) Educated Patient;Spouse    Methods Explanation;Demonstration;Handout    Comprehension Verbalized understanding;Returned demonstration;Need further instruction               OT Long Term  Goals - 11/10/19 0957      OT LONG TERM GOAL #1   Title Pt will be able to apply thigh length, multi-layer, short stretch compression wraps daily to single limb using correct gradient techniques with Maximum assistance to achieve optimal limb volume reduction, to return affected limb/s, as closely as possible, to premorbid size and shape, to limit infection risk, and to improve safe functional ambulation and mobility.    Status Achieved   Pt unable to bend left knee to extent needed to appy compression garments independently. Cargiver is independent with gradient techniques.     OT LONG TERM GOAL #2   Title Pt will be able to verbalize signs and symptoms of cellulitis infection and identify at least 4 common lymphedema precautions using printed resource for reference to limit LE progression over time to limit risk of infection and LE exacerbation.    Status Achieved      OT LONG TERM GOAL #3   Status Achieved      OT LONG TERM GOAL #4   Status Achieved   achieved this date.. Goal met for LLE     OT LONG TERM GOAL #5   Status Achieved      OT LONG TERM GOAL #6   Status Partially Met   At present requires Max A as he cannot reach feet to don stockings                 Patient will benefit from skilled therapeutic intervention in order to improve the following deficits and impairments:           Visit Diagnosis: Lymphedema, not elsewhere classified    Problem List Patient Active Problem List   Diagnosis Date Noted  . Vitiligo 09/03/2019  . Port-A-Cath in place 08/20/2019  . Encounter for antineoplastic immunotherapy 08/06/2019  . Malignant melanoma of overlapping sites ([place]) 04/23/2019  . Goals of care, counseling/discussion 04/23/2019  . 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 Near syncope 12/17/2018  . Anemia in chronic kidney disease 12/11/2018  . Benign hypertensive kidney disease with chronic kidney disease 12/11/2018  . Stage 3a  chronic kidney disease 12/11/2018  . AKI (acute kidney injury) ([place]) 08/24/2018  . Acute hyperkalemia 08/24/2018  . HTN (hypertension) 08/24/2018  . HLD (hyperlipidemia) 08/24/2018  . Seizures ([place]) 08/24/2018  . Complete heart block ([place]) 08/24/2018  . Arthritis of wrist, left 06/12/2018  . Bradycardia 06/04/2018  . 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 Positive ANA (antinuclear antibody) 03/05/2018  . Swelling of joint of left wrist 03/05/2018  . Coronary artery disease involving native coronary artery of native heart 07/09/2017  . Palpitations 07/09/2017  . Stable angina ([place]) 06/27/2017  . Benign essential HTN 05/25/2017  . LBBB (left bundle branch block) 05/25/2017    Edward Pitts 11/26/2019, 4:31 PM  [place] MAIN [facility] SERVICES [address] [place], [place], [address] Phone: [phone]   Fax:  [phone]  Name: Edward Pitts MRN: [id number] Date of Birth: [birth year]

## 2019-11-27 ENCOUNTER — Inpatient Hospital Stay: Payer: 59

## 2019-11-27 ENCOUNTER — Other Ambulatory Visit: Payer: Self-pay

## 2019-11-27 ENCOUNTER — Ambulatory Visit: Payer: 59 | Attending: Oncology | Admitting: Occupational Therapy

## 2019-11-27 ENCOUNTER — Encounter: Payer: Self-pay | Admitting: Oncology

## 2019-11-27 ENCOUNTER — Inpatient Hospital Stay (HOSPITAL_BASED_OUTPATIENT_CLINIC_OR_DEPARTMENT_OTHER): Payer: 59 | Admitting: Oncology

## 2019-11-27 VITALS — BP 128/68 | HR 75 | Temp 96.4°F | Resp 18 | Wt 234.3 lb

## 2019-11-27 DIAGNOSIS — C4359 Malignant melanoma of other part of trunk: Secondary | ICD-10-CM | POA: Insufficient documentation

## 2019-11-27 DIAGNOSIS — L80 Vitiligo: Secondary | ICD-10-CM | POA: Insufficient documentation

## 2019-11-27 DIAGNOSIS — Z95828 Presence of other vascular implants and grafts: Secondary | ICD-10-CM

## 2019-11-27 DIAGNOSIS — I890 Lymphedema, not elsewhere classified: Secondary | ICD-10-CM | POA: Insufficient documentation

## 2019-11-27 DIAGNOSIS — Z5112 Encounter for antineoplastic immunotherapy: Secondary | ICD-10-CM

## 2019-11-27 DIAGNOSIS — C438 Malignant melanoma of overlapping sites of skin: Secondary | ICD-10-CM

## 2019-11-27 DIAGNOSIS — Z79899 Other long term (current) drug therapy: Secondary | ICD-10-CM | POA: Insufficient documentation

## 2019-11-27 DIAGNOSIS — D649 Anemia, unspecified: Secondary | ICD-10-CM | POA: Insufficient documentation

## 2019-11-27 DIAGNOSIS — M1712 Unilateral primary osteoarthritis, left knee: Secondary | ICD-10-CM | POA: Insufficient documentation

## 2019-11-27 LAB — CBC WITH DIFFERENTIAL/PLATELET
Abs Immature Granulocytes: 0.05 10*3/uL (ref 0.00–0.07)
Basophils Absolute: 0 10*3/uL (ref 0.0–0.1)
Basophils Relative: 1 %
Eosinophils Absolute: 0.3 10*3/uL (ref 0.0–0.5)
Eosinophils Relative: 7 %
HCT: 33.8 % — ABNORMAL LOW (ref 39.0–52.0)
Hemoglobin: 11.6 g/dL — ABNORMAL LOW (ref 13.0–17.0)
Immature Granulocytes: 1 %
Lymphocytes Relative: 17 %
Lymphs Abs: 0.7 10*3/uL (ref 0.7–4.0)
MCH: 30.4 pg (ref 26.0–34.0)
MCHC: 34.3 g/dL (ref 30.0–36.0)
MCV: 88.5 fL (ref 80.0–100.0)
Monocytes Absolute: 0.4 10*3/uL (ref 0.1–1.0)
Monocytes Relative: 10 %
Neutro Abs: 2.8 10*3/uL (ref 1.7–7.7)
Neutrophils Relative %: 64 %
Platelets: 194 10*3/uL (ref 150–400)
RBC: 3.82 MIL/uL — ABNORMAL LOW (ref 4.22–5.81)
RDW: 13.2 % (ref 11.5–15.5)
WBC: 4.4 10*3/uL (ref 4.0–10.5)
nRBC: 0 % (ref 0.0–0.2)

## 2019-11-27 LAB — COMPREHENSIVE METABOLIC PANEL
ALT: 40 U/L (ref 0–44)
AST: 29 U/L (ref 15–41)
Albumin: 4 g/dL (ref 3.5–5.0)
Alkaline Phosphatase: 89 U/L (ref 38–126)
Anion gap: 10 (ref 5–15)
BUN: 20 mg/dL (ref 6–20)
CO2: 24 mmol/L (ref 22–32)
Calcium: 9 mg/dL (ref 8.9–10.3)
Chloride: 106 mmol/L (ref 98–111)
Creatinine, Ser: 1.1 mg/dL (ref 0.61–1.24)
GFR calc Af Amer: >60 mL/min (ref >60)
GFR calc non Af Amer: >60 mL/min (ref >60)
Glucose, Bld: 125 mg/dL — ABNORMAL HIGH (ref 70–99)
Potassium: 4.1 mmol/L (ref 3.5–5.1)
Sodium: 140 mmol/L (ref 135–145)
Total Bilirubin: 0.6 mg/dL (ref 0.3–1.2)
Total Protein: 7.1 g/dL (ref 6.5–8.1)

## 2019-11-27 MED ORDER — SODIUM CHLORIDE 0.9 % IV SOLN
Freq: Once | INTRAVENOUS | Status: AC
  Filled 2019-11-27: qty 250

## 2019-11-27 MED ORDER — HEPARIN SOD (PORK) LOCK FLUSH 100 UNIT/ML IV SOLN
INTRAVENOUS | Status: AC
  Filled 2019-11-27: qty 5

## 2019-11-27 MED ORDER — SODIUM CHLORIDE 0.9% FLUSH
10.0000 mL | Freq: Once | INTRAVENOUS | Status: AC
  Administered 2019-11-27: 10 mL via INTRAVENOUS
  Filled 2019-11-27: qty 10

## 2019-11-27 MED ORDER — HEPARIN SOD (PORK) LOCK FLUSH 100 UNIT/ML IV SOLN
500.0000 [IU] | Freq: Once | INTRAVENOUS | Status: AC
  Administered 2019-11-27: 500 [IU] via INTRAVENOUS
  Filled 2019-11-27: qty 5

## 2019-11-27 MED ORDER — SODIUM CHLORIDE 0.9 % IV SOLN
240.0000 mg | Freq: Once | INTRAVENOUS | Status: AC
  Administered 2019-11-27: 240 mg via INTRAVENOUS
  Filled 2019-11-27: qty 24

## 2019-11-27 NOTE — Progress Notes (Signed)
Hematology/Oncology follow up  note [facility] [phone] [phone]   Patient Care Team: [name], [name], MD as PCP - General (Student)  REFERRING PROVIDER: Cherylann Parr, MD  CHIEF COMPLAINTS/REASON FOR VISIT:  Follow up for melanoma HISTORY OF PRESENTING ILLNESS:   Edward Pitts is a  59 y.o.  male with PMH listed below was seen in consultation at the request of  [name], [name], MD  for evaluation of inguinal mass Patient presented to emergency room 3 days ago complaining about left ing uinal mass discomfort. Reports that he has really noticed the mass growing for the past 1 months. He has a history of left lower extremity melanoma in 2011, status post local excision.  Pain was increased with squatting of laxation. He was advised to take Tylenol for pain. Denies any fever, chills, night sweating.  He does feel mild nauseated. Appetite is fair.  He has lost about 10 pounds since earlier this year. 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  Left inguinal and left iliac nodes which are suspicious for involvement.  There are also 2 small nonspecific hypodense lesions within the right liver, nonspecific.  # patient underwent left groin mass resection On 04/16/2019. Resection pathology showed malignant melanoma, replacing a lymph node, with extracapsular extension, peripheral and deep margins involved.  Left inguinal contents, all 7 lymph nodes were negative for melanoma in the lymph nodes.  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  # PDL1 80% TPS  # 04/16/2019. underwent left groin mass resection   07/07/2019  Status post adjuvant radiation and finished radiation  Patient has Mediport placed to facilitate immunotherapy treatments. INTERVAL HISTORY Edward Pitts is a 59 y.o. male who has above history  reviewed by me today presents for follow up visit for management of inguinal nodal recurrence of melanoma Problems and complaints are listed below: No new complaints.  Chronic bilateral upper extremity hypopigmentation, unchanged.  . Review of Systems  Constitutional: Negative for appetite change, chills, fatigue, fever and unexpected weight change.  HENT:   Negative for hearing loss and voice change.   Eyes: Negative for eye problems and icterus.  Respiratory: Negative for chest tightness, cough and shortness of breath.   Cardiovascular: Negative for chest pain and leg swelling.  Gastrointestinal: Negative for abdominal distention and abdominal pain.  Endocrine: Negative for hot flashes.  Genitourinary: Negative for difficulty urinating, dysuria and frequency.   Musculoskeletal: Positive for arthralgias.  Skin: Negative for itching and rash.       Skin hypo-pigmentation on upper extremities, no change  Neurological: Negative for light-headedness and numbness.  Hematological: Negative for adenopathy. Does not bruise/bleed easily.  Psychiatric/Behavioral: Negative for confusion.    MEDICAL HISTORY:  Past Medical History:  Diagnosis Date  . Anxiety   . Arthritis   . Complication of anesthesia   . Family history of adverse reaction to anesthesia    PONV mother  . Hyperlipidemia   . Hypertension   . Melanoma ([place]) 2012  . PONV (postoperative nausea and vomiting) 04/16/2019  . Presence of permanent cardiac pacemaker    [employer]  . Seizures ([place])     SURGICAL HISTORY: Past Surgical History:  Procedure Laterality Date  . KNEE SURGERY Left   . LEFT HEART CATH AND CORONARY ANGIOGRAPHY Left 06/29/2017   Procedure: LEFT HEART CATH AND CORONARY ANGIOGRAPHY;  Surgeon: Serafina Royals  J, MD;  Location: [place] [facility];  Service: Cardiovascular;  Laterality: Left;  . LYMPH NODE DISSECTION Left 04/16/2019   Procedure: Left inguinal Lymph Node Dissection;  Surgeon: [name], MD;   Location: [place];  Service: General;  Laterality: Left;  Marland Kitchen MELANOMA EXCISION Left 04/16/2019   Procedure: MELANOMA EXCISION LEFT GROIN MASS;  Surgeon: [name], MD;  Location: [place];  Service: General;  Laterality: Left;  Marland Kitchen MELANOMA EXCISION WITH SENTINEL LYMPH NODE BIOPSY Left 2012   Left calf   . PACEMAKER INSERTION N/A 08/26/2018   Procedure: INSERTION PACEMAKER;  Surgeon: [name], MD;  Location: [facility];  Service: Cardiovascular;  Laterality: N/A;  . PORTA CATH INSERTION N/A 08/26/2019   Procedure: PORTA CATH INSERTION;  Surgeon: Katha Cabal, MD;  Location: [place] [facility];  Service: Cardiovascular;  Laterality: N/A;  . TEMPORARY PACEMAKER N/A 08/25/2018   Procedure: TEMPORARY PACEMAKER;  Surgeon: [name], MD;  Location: [place] [facility];  Service: Cardiovascular;  Laterality: N/A;    SOCIAL HISTORY: Social History   Socioeconomic History  . Marital status: Married    Spouse name: Vicente Males   . Number of children: 7  . Years of education: 10  . Highest education level: Not on file  Occupational History  . Occupation: [employer]   Tobacco Use  . Smoking status: Never Smoker  . Smokeless tobacco: Never Used  Vaping Use  . Vaping Use: Never used  Substance and Sexual Activity  . Alcohol use: No  . Drug use: No  . Sexual activity: Not on file  Other Topics Concern  . Not on file  Social History Narrative   Lives with mother, wife and sister   Caffeine use: sodas (2 per day)   Social Determinants of Health   Financial Resource Strain: Low Risk   . Difficulty of Paying Living Expenses: Not hard at all  Food Insecurity: No Food Insecurity  . Worried About Charity fundraiser in the Last Year: Never true  . Ran Out of Food in the Last Year: Never true  Transportation Needs: Unmet Transportation Needs  . Lack of Transportation (Medical): Yes  . Lack of Transportation (Non-Medical): Yes  Physical Activity: Unknown  . Days of Exercise per Week: 0  days  . Minutes of Exercise per Session: Not on file  Stress: No Stress Concern Present  . Feeling of Stress : Only a little  Social Connections: Unknown  . Frequency of Communication with Friends and Family: More than three times a week  . Frequency of Social Gatherings with Friends and Family: Not on file  . Attends Religious Services: Not on file  . Active Member of Clubs or Organizations: Not on file  . Attends Archivist Meetings: Not on file  . Marital Status: Married  Human resources officer Violence: Not At Risk  . Fear of Current or Ex-Partner: No  . Emotionally Abused: No  . Physically Abused: No  . Sexually Abused: No    FAMILY HISTORY: Family History  Problem Relation Age of Onset  . Cancer Paternal Grandmother     ALLERGIES:  is allergic to ibuprofen and nsaids.  MEDICATIONS:  Current Outpatient Medications  Medication Sig Dispense Refill  . acetaminophen (TYLENOL) 650 MG CR tablet Take 650 mg by mouth every 8 (eight) hours as needed for pain.     Marland Kitchen allopurinol (ZYLOPRIM) 300 MG tablet Take 300 mg by mouth daily.    Marland Kitchen aspirin 81 MG chewable tablet  Chew 81 mg by mouth daily.    Marland Kitchen atorvastatin (LIPITOR) 20 MG tablet Take 20 mg by mouth daily.    . clonazePAM (KLONOPIN) 0.5 MG tablet 1 tablet in the morning, 2 in the evening (Patient taking differently: Take 0.5 mg by mouth daily. Takes 2 tabs as needed.) 90 tablet 3  . hydrocortisone 2.5 % cream Apply topically daily as needed.    Marland Kitchen ketoconazole (NIZORAL) 2 % cream Apply 1 application topically daily as needed for irritation.    . lidocaine-prilocaine (EMLA) cream Apply 1 application topically as needed. Apply small amount of cream to port site approx 1-2 hours prior to appointment. 30 g 2  . lisinopril (ZESTRIL) 20 MG tablet Take 20 mg by mouth daily.    . metoprolol succinate (TOPROL-XL) 25 MG 24 hr tablet Take 25 mg by mouth daily.     . Multiple Vitamin (MULTIVITAMIN WITH MINERALS) TABS tablet Take 1 tablet by  mouth daily. Centrum Silver    . ondansetron (ZOFRAN) 8 MG tablet Take 1 tablet (8 mg total) by mouth every 8 (eight) hours as needed for nausea, vomiting or refractory nausea / vomiting. 30 tablet 0  . silver sulfADIAZINE (SILVADENE) 1 % cream Apply 1 application topically 2 (two) times daily. (Patient not taking: Reported on 08/06/2019) 50 g 2  . traMADol (ULTRAM) 50 MG tablet Take 1 tablet (50 mg total) by mouth every 6 (six) hours as needed. (Patient not taking: Reported on 08/19/2019) 30 tablet 0   No current facility-administered medications for this visit.   Facility-Administered Medications Ordered in Other Visits  Medication Dose Route Frequency Provider Last Rate Last Admin  . heparin lock flush 100 unit/mL  500 Units Intravenous Once [name], MD         PHYSICAL EXAMINATION: ECOG PERFORMANCE STATUS: 0 - Asymptomatic Vitals:   11/27/19 0845  BP: 128/68  Pulse: 75  Resp: 18  Temp: (!) 96.4 F (35.8 C)   Filed Weights   11/27/19 0845  Weight: 234 lb 4.8 oz (106.3 kg)    Physical Exam Constitutional:      General: He is not in acute distress. HENT:     Head: Normocephalic and atraumatic.  Eyes:     General: No scleral icterus.    Pupils: Pupils are equal, round, and reactive to light.  Cardiovascular:     Rate and Rhythm: Normal rate and regular rhythm.     Heart sounds: Normal heart sounds.  Pulmonary:     Effort: Pulmonary effort is normal. No respiratory distress.     Breath sounds: No wheezing.  Abdominal:     General: Bowel sounds are normal. There is no distension.     Palpations: Abdomen is soft. There is no mass.     Tenderness: There is no abdominal tenderness.     Comments:  left groin mass resection and status post radiation.   Musculoskeletal:        General: No deformity. Normal range of motion.     Cervical back: Normal range of motion and neck supple.     Comments: Left lower extremity edema  Skin:    General: Skin is warm and dry.      Findings: No erythema or rash.  Neurological:     Mental Status: He is alert and oriented to person, place, and time. Mental status is at baseline.     Cranial Nerves: No cranial nerve deficit.     Coordination: Coordination normal.  Psychiatric:  Mood and Affect: Mood normal.    LABORATORY DATA:  I have reviewed the data as listed Lab Results  Component Value Date   WBC 4.4 11/13/2019   HGB 11.5 (L) 11/13/2019   HCT 33.5 (L) 11/13/2019   MCV 88.6 11/13/2019   PLT 194 11/13/2019   Recent Labs    10/16/19 0834 10/30/19 0807 11/13/19 0828  NA 138 136 141  K 5.2* 5.2* 3.9  CL 107 107 109  CO2 _0 GLUCOSE 110* 118* 119*  BUN 28* 42* 21*  CREATININE 1.36* 1.38* 1.24  CALCIUM 9.3 9.1 8.9  GFRNONAA 56* 55* >60  GFRAA >60 >60 >60  PROT 7.2 7.3 7.4  ALBUMIN 4.3 4.4 4.1  AST _1 ALT 28 33 34  ALKPHOS 85 88 81  BILITOT 0.7 0.5 0.6   Iron/TIBC/Ferritin/ %Sat    Component Value Date/Time   IRON 85 10/02/2019 0841   TIBC 342 10/02/2019 0841   FERRITIN 61 10/02/2019 0841   IRONPCTSAT 25 10/02/2019 0841      RADIOGRAPHIC STUDIES: I have personally reviewed the radiological images as listed and agreed with the findings in the report. CT Chest W Contrast  Result Date: 09/10/2019 CLINICAL DATA:  Staging of malignant melanoma, status post left groin mass resection in January. On immunotherapy. Loose stools. Skin melanoma resection in 2012. EXAM: CT CHEST, ABDOMEN, AND PELVIS WITH CONTRAST TECHNIQUE: Multidetector CT imaging of the chest, abdomen and pelvis was performed following the standard protocol during bolus administration of intravenous contrast. CONTRAST:  134m OMNIPAQUE IOHEXOL 300 MG/ML  SOLN COMPARISON:  01/31/2019 CTA of the abdomen and pelvis. PET of 02/11/2019. No prior dedicated chest CTs. FINDINGS: CT CHEST FINDINGS Cardiovascular: Pacer. Aortic atherosclerosis. Normal heart size, without pericardial effusion. No central pulmonary embolism, on  this non-dedicated study. Lad coronary artery calcification. Mediastinum/Nodes: No supraclavicular adenopathy. 8 mm right-sided thyroid nodule. No supraclavicular adenopathy. No mediastinal or hilar adenopathy. Lungs/Pleura: No pleural fluid. No suspicious pulmonary nodule or mass. Musculoskeletal: No acute osseous abnormality. CT ABDOMEN PELVIS FINDINGS Hepatobiliary: Subcentimeter right hepatic lobe cyst. There is also a too small to characterize, 3-4 mm more inferior right hepatic lobe lesion on 60/2. Likely present on the prior PET. Normal gallbladder, without biliary ductal dilatation. Pancreas: Normal, without mass or ductal dilatation. Spleen: Normal in size, without focal abnormality. Adrenals/Urinary Tract: Normal adrenal glands. Lower pole right renal sinus cyst or minimally complex cyst of 1.9 cm. Upper pole right renal subcentimeter lesion is too small to characterize. No hydronephrosis. Normal urinary bladder. Stomach/Bowel: Normal stomach, without wall thickening. Normal colon and terminal ileum. Normal small bowel. Vascular/Lymphatic: Advanced aortic and branch vessel atherosclerosis. The previously described dominant left inguinal mass has been resected, with subcutaneous and intramuscular edema identified. The left external iliac nodes measure maximally 5 mm today on 107/2 versus 8 mm on the prior PET (when remeasured). No pelvic sidewall adenopathy. Reproductive: Normal prostate. Other: No significant free fluid. Moderate size fat containing periumbilical ventral wall hernia. Musculoskeletal: Degenerative partial fusion of the right sacroiliac joint. IMPRESSION: 1. Interval resection of previously described left inguinal mass. The indeterminate left external iliac nodes on prior PET are decreased in size. 2. No evidence of new or progressive disease. 3. Coronary artery atherosclerosis. Aortic Atherosclerosis (ICD10-I70.0). 4. A too small to characterize right hepatic lobe low-density lesion is felt  to be similar to on the prior PET, not hypermetabolic on that exam. Felt unlikely to represent metastasis but technically indeterminate. Recommend attention on  follow-up. 5. 8 mm right thyroid nodule. Not clinically significant; no follow-up imaging recommended (ref: J Am Coll Radiol. 2015 Feb;12(2): 143-50). Electronically Signed   By: Abigail Miyamoto M.D.   On: 09/10/2019 11:08   CT Abdomen Pelvis W Contrast  Result Date: 09/10/2019 CLINICAL DATA:  Staging of malignant melanoma, status post left groin mass resection in January. On immunotherapy. Loose stools. Skin melanoma resection in 2012. EXAM: CT CHEST, ABDOMEN, AND PELVIS WITH CONTRAST TECHNIQUE: Multidetector CT imaging of the chest, abdomen and pelvis was performed following the standard protocol during bolus administration of intravenous contrast. CONTRAST:  163m OMNIPAQUE IOHEXOL 300 MG/ML  SOLN COMPARISON:  01/31/2019 CTA of the abdomen and pelvis. PET of 02/11/2019. No prior dedicated chest CTs. FINDINGS: CT CHEST FINDINGS Cardiovascular: Pacer. Aortic atherosclerosis. Normal heart size, without pericardial effusion. No central pulmonary embolism, on this non-dedicated study. Lad coronary artery calcification. Mediastinum/Nodes: No supraclavicular adenopathy. 8 mm right-sided thyroid nodule. No supraclavicular adenopathy. No mediastinal or hilar adenopathy. Lungs/Pleura: No pleural fluid. No suspicious pulmonary nodule or mass. Musculoskeletal: No acute osseous abnormality. CT ABDOMEN PELVIS FINDINGS Hepatobiliary: Subcentimeter right hepatic lobe cyst. There is also a too small to characterize, 3-4 mm more inferior right hepatic lobe lesion on 60/2. Likely present on the prior PET. Normal gallbladder, without biliary ductal dilatation. Pancreas: Normal, without mass or ductal dilatation. Spleen: Normal in size, without focal abnormality. Adrenals/Urinary Tract: Normal adrenal glands. Lower pole right renal sinus cyst or minimally complex cyst of 1.9  cm. Upper pole right renal subcentimeter lesion is too small to characterize. No hydronephrosis. Normal urinary bladder. Stomach/Bowel: Normal stomach, without wall thickening. Normal colon and terminal ileum. Normal small bowel. Vascular/Lymphatic: Advanced aortic and branch vessel atherosclerosis. The previously described dominant left inguinal mass has been resected, with subcutaneous and intramuscular edema identified. The left external iliac nodes measure maximally 5 mm today on 107/2 versus 8 mm on the prior PET (when remeasured). No pelvic sidewall adenopathy. Reproductive: Normal prostate. Other: No significant free fluid. Moderate size fat containing periumbilical ventral wall hernia. Musculoskeletal: Degenerative partial fusion of the right sacroiliac joint. IMPRESSION: 1. Interval resection of previously described left inguinal mass. The indeterminate left external iliac nodes on prior PET are decreased in size. 2. No evidence of new or progressive disease. 3. Coronary artery atherosclerosis. Aortic Atherosclerosis (ICD10-I70.0). 4. A too small to characterize right hepatic lobe low-density lesion is felt to be similar to on the prior PET, not hypermetabolic on that exam. Felt unlikely to represent metastasis but technically indeterminate. Recommend attention on follow-up. 5. 8 mm right thyroid nodule. Not clinically significant; no follow-up imaging recommended (ref: J Am Coll Radiol. 2015 Feb;12(2): 143-50). Electronically Signed   By: KAbigail MiyamotoM.D.   On: 09/10/2019 11:08       ASSESSMENT & PLAN:  1. Malignant melanoma of overlapping sites ([place]   2. Encounter for antineoplastic immunotherapy   3. Vitiligo   4. Arthritis of left knee    #Left inguinal nodal recurrence of melanoma.  Status post resection and adjuvant radiation. Labs are reviewed and discussed with patient. Counts are acceptable to proceed with nivolumab treatment today.  Obtain CT chest abdomen pelvis prior to next  visit.   # Vitiligo, stable.  # Normocytic anemia, stable. Hemoglobin 11.6 # Left knee arthritis [facility]. All questions were answered. The patient knows to call the clinic with any problems questions or concerns.  Follow-up with 2 weeks. We spent sufficient time to  discuss many aspect of care, questions were answered to patient's satisfaction.   [name], MD, PhD Hematology Oncology [facility] Pager- [phone] 11/27/2019

## 2019-11-27 NOTE — Progress Notes (Signed)
Pt here for follow up. No new concerns voiced.   

## 2019-12-03 ENCOUNTER — Encounter: Payer: 59 | Admitting: Occupational Therapy

## 2019-12-05 ENCOUNTER — Ambulatory Visit: Payer: 59 | Admitting: Occupational Therapy

## 2019-12-05 ENCOUNTER — Other Ambulatory Visit: Payer: Self-pay

## 2019-12-05 DIAGNOSIS — I890 Lymphedema, not elsewhere classified: Secondary | ICD-10-CM | POA: Diagnosis not present

## 2019-12-05 NOTE — Therapy (Signed)
[place] MAIN [facility] SERVICES [address] [place], [place], [address] Phone: [phone]   Fax:  [phone]  Occupational Therapy Treatment  Patient Details  Name: Edward Pitts MRN: [id number] Date of Birth: 01/13/1961 Referring Provider (OT): [name], MD   Encounter Date: 12/05/2019   OT End of Session - 12/05/19 0806    Visit Number 35    Number of Visits 36    Date for OT Re-Evaluation 02/03/20    OT Start Time 0802    OT Stop Time 0841    OT Time Calculation (min) 39 min    Activity Tolerance Patient tolerated treatment well;No increased pain    Behavior During Therapy WFL for tasks assessed/performed           Past Medical History:  Diagnosis Date  . Anxiety   . Arthritis   . Complication of anesthesia   . Family history of adverse reaction to anesthesia    PONV mother  . Hyperlipidemia   . Hypertension   . Melanoma ([place]) 2012  . PONV (postoperative nausea and vomiting) 04/16/2019  . Presence of permanent cardiac pacemaker    Medtronic  . Seizures ([place])     Past Surgical History:  Procedure Laterality Date  . KNEE SURGERY Left   . LEFT HEART CATH AND CORONARY ANGIOGRAPHY Left 06/29/2017   Procedure: LEFT HEART CATH AND CORONARY ANGIOGRAPHY;  Surgeon: [name], MD;  Location: [place] [facility];  Service: Cardiovascular;  Laterality: Left;  . LYMPH NODE DISSECTION Left 04/16/2019   Procedure: Left inguinal Lymph Node Dissection;  Surgeon: [name], MD;  Location: [place];  Service: General;  Laterality: Left;  Marland Kitchen MELANOMA EXCISION Left 04/16/2019   Procedure: MELANOMA EXCISION LEFT GROIN MASS;  Surgeon: [name], MD;  Location: [place];  Service: General;  Laterality: Left;  Marland Kitchen MELANOMA EXCISION WITH SENTINEL LYMPH NODE BIOPSY Left 2012   Left calf   . PACEMAKER INSERTION N/A 08/26/2018   Procedure: INSERTION PACEMAKER;  Surgeon: [name], MD;  Location: [facility];  Service: Cardiovascular;   Laterality: N/A;  . PORTA CATH INSERTION N/A 08/26/2019   Procedure: PORTA CATH INSERTION;  Surgeon: Katha Cabal, MD;  Location: [place] [facility];  Service: Cardiovascular;  Laterality: N/A;  . TEMPORARY PACEMAKER N/A 08/25/2018   Procedure: TEMPORARY PACEMAKER;  Surgeon: [name], MD;  Location: [place] [facility];  Service: Cardiovascular;  Laterality: N/A;    There were no vitals filed for this visit.   Subjective Assessment - 12/05/19 9030    Subjective  Chane Cowden presents to OT for visit 35/36 to address LLE lymphedema 2/2 melanoma Rx. Mr Wuertz presents wearing off the shelf compression garment. Pt reports lower leg swelling seems well controlled with OTS compression stocking, but thigh still swells. Mr Peabody reports L knee pain is unchanged.    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 w/ LN disection (-7/7 LN); Completed adjuvant XRT 07/07/2019; 08/2018 Pacemaker placed; L knee sx date?;    Limitations chronic LLE knee pain; post surgical LLE pain,  chronic LLE/LLQ swelling, decreased LLE strength, impaired gait, decreased dynamic balance, altered sensation LLE, increased risk of infection, decreased standing and walking tolerance > 1 hr; decreased ankle AROM    Repetition Increases Symptoms    Special Tests +Stemmer base L toes    Patient Stated Goals reduce swelling to normal and keep it from getting worse  Pain Onset More than a month ago                        OT Treatments/Exercises (OP) - 12/05/19 0001      ADLs   ADL Education Given Yes      Manual Therapy   Manual Therapy Edema management    Edema Management fitting custom compression thigh high                  OT Education - 12/05/19 0844    Education Details Skilled LE self care training for wear and care regimes for custom elastic compression garments fitted today. Provided  training for donning and doffing  garments using assistive devices (friction gloves and nylon/tyvek sock).    Person(s) Educated Patient;Spouse    Methods Explanation;Demonstration;Handout    Comprehension Verbalized understanding;Returned demonstration;Need further instruction               OT Long Term Goals - 11/10/19 0957      OT LONG TERM GOAL #1   Title Pt will be able to apply thigh length, multi-layer, short stretch compression wraps daily to single limb using correct gradient techniques with Maximum assistance to achieve optimal limb volume reduction, to return affected limb/s, as closely as possible, to premorbid size and shape, to limit infection risk, and to improve safe functional ambulation and mobility.    Status Achieved   Pt unable to bend left knee to extent needed to appy compression garments independently. Cargiver is independent with gradient techniques.     OT LONG TERM GOAL #2   Title Pt will be able to verbalize signs and symptoms of cellulitis infection and identify at least 4 common lymphedema precautions using printed resource for reference to limit LE progression over time to limit risk of infection and LE exacerbation.    Status Achieved      OT LONG TERM GOAL #3   Status Achieved      OT LONG TERM GOAL #4   Status Achieved   achieved this date.. Goal met for LLE     OT LONG TERM GOAL #5   Status Achieved      OT LONG TERM GOAL #6   Status Partially Met   At present requires Max A as he cannot reach feet to don stockings                Plan - 12/05/19 0845    Clinical Impression Statement Completed initial fitting of custom thigh length compression stocking. Pt educated on donning and doffing, wear and care guidelines and precautions. Upon initial assessment garment appears to fit very well. Pt will wash and wear for next week and we'll complete fitting assessment at next visit.    OT Occupational Profile and History Comprehensive Assessment- Review of records and extensive  additional review of physical, cognitive, psychosocial history related to current functional performance    Occupational performance deficits (Please refer to evaluation for details): ADL's;IADL's;Rest and Sleep;Work;Leisure;Social Participation;Other   body image, role performance   Body Structure / Function / Physical Skills ADL;ROM;Scar mobility;IADL;Edema;Balance;Sensation;Skin integrity;Mobility;Flexibility;Strength;Decreased knowledge of precautions;Gait;Pain;Decreased knowledge of use of DME    Rehab Potential Good    Clinical Decision Making Several treatment options, min-mod task modification necessary    Comorbidities Affecting Occupational Performance: Presence of comorbidities impacting occupational performance    Modification or Assistance to Complete Evaluation  Min-Moderate modification of tasks or assist with assess necessary to complete eval  OT Frequency 2x / week    OT Duration 12 weeks   and PRN   OT Treatment/Interventions Self-care/ADL training;Therapeutic exercise;Functional Mobility Training;Manual Therapy;Coping strategies training;Therapeutic activities;Manual lymph drainage;Energy conservation;DME and/or AE instruction;Compression bandaging;Other (comment);Scar mobilization;Patient/family education   fit with appropriate compression garments once swelling is reduced   Plan Complete Decongestive Therapy (CDT), Intensive and Management Phases to include  manual lymphatyic drainage (MLD), skin care, therapeutic exercise, compression with short stretch wraps and garments, Pt edu for LE sef care    Consulted and Agree with Plan of Care Patient           Patient will benefit from skilled therapeutic intervention in order to improve the following deficits and impairments:   Body Structure / Function / Physical Skills: ADL, ROM, Scar mobility, IADL, Edema, Balance, Sensation, Skin integrity, Mobility, Flexibility, Strength, Decreased knowledge of precautions, Gait, Pain,  Decreased knowledge of use of DME       Visit Diagnosis: Lymphedema, not elsewhere classified    Problem List Patient Active Problem List   Diagnosis Date Noted  . Arthritis of left knee 11/27/2019  . Vitiligo 09/03/2019  . Port-A-Cath in place 08/20/2019  . Encounter for antineoplastic immunotherapy 08/06/2019  . Malignant melanoma of overlapping sites ([place]) 04/23/2019  . Goals of care, counseling/discussion 04/23/2019  . 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 Near syncope 12/17/2018  . Anemia in chronic kidney disease 12/11/2018  . Benign hypertensive kidney disease with chronic kidney disease 12/11/2018  . Stage 3a chronic kidney disease 12/11/2018  . AKI (acute kidney injury) ([place]) 08/24/2018  . Acute hyperkalemia 08/24/2018  . HTN (hypertension) 08/24/2018  . HLD (hyperlipidemia) 08/24/2018  . Seizures ([place]) 08/24/2018  . Complete heart block ([place]) 08/24/2018  . Arthritis of wrist, left 06/12/2018  . Bradycardia 06/04/2018  . 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 Positive ANA (antinuclear antibody) 03/05/2018  . Swelling of joint of left wrist 03/05/2018  . Coronary artery disease involving native coronary artery of native heart 07/09/2017  . Palpitations 07/09/2017  . Stable angina ([place]) 06/27/2017  . Benign essential HTN 05/25/2017  . LBBB (left bundle branch block) 05/25/2017    Andrey Spearman, MS, OTR/L, [facility] 12/05/19 8:47 AM  [place] MAIN [facility] SERVICES [place], [place], [address] Phone: [phone]   Fax:  [phone]  Name: Alfonzia Woolum MRN: [id number] Date of Birth: 10-15-1960

## 2019-12-05 NOTE — Patient Instructions (Signed)
Do NOT sleep in compression garments!

## 2019-12-10 ENCOUNTER — Ambulatory Visit: Payer: 59 | Admitting: Occupational Therapy

## 2019-12-10 ENCOUNTER — Encounter: Payer: 59 | Admitting: Occupational Therapy

## 2019-12-10 ENCOUNTER — Ambulatory Visit
Admission: RE | Admit: 2019-12-10 | Discharge: 2019-12-10 | Disposition: A | Discharge status: Home / Self Care | Payer: 59 | Source: Ambulatory Visit | Attending: Oncology | Admitting: Oncology

## 2019-12-10 ENCOUNTER — Other Ambulatory Visit: Payer: Self-pay

## 2019-12-10 DIAGNOSIS — C438 Malignant melanoma of overlapping sites of skin: Secondary | ICD-10-CM | POA: Diagnosis not present

## 2019-12-10 DIAGNOSIS — I890 Lymphedema, not elsewhere classified: Secondary | ICD-10-CM

## 2019-12-10 MED ORDER — IOHEXOL 300 MG/ML  SOLN
100.0000 mL | Freq: Once | INTRAMUSCULAR | Status: AC | PRN
  Administered 2019-12-10: 100 mL via INTRAVENOUS

## 2019-12-10 NOTE — Therapy (Signed)
[place] MAIN [facility] SERVICES [address] [place], [place], [address] Phone: [phone]   Fax:  [phone]  Occupational Therapy Treatment  Patient Details  Name: Edward Pitts MRN: [id number] Date of Birth: Jul 24, 1960 Referring Provider (OT): [name], MD   Encounter Date: 12/10/2019   OT End of Session - 12/10/19 1536    Visit Number 36    Number of Visits 36    Date for OT Re-Evaluation 03/09/20    Activity Tolerance Patient tolerated treatment well;No increased pain    Behavior During Therapy WFL for tasks assessed/performed           Past Medical History:  Diagnosis Date  . Anxiety   . Arthritis   . Complication of anesthesia   . Family history of adverse reaction to anesthesia    PONV mother  . Hyperlipidemia   . Hypertension   . Melanoma ([place]) 2012  . PONV (postoperative nausea and vomiting) 04/16/2019  . Presence of permanent cardiac pacemaker    Medtronic  . Seizures ([place])     Past Surgical History:  Procedure Laterality Date  . KNEE SURGERY Left   . LEFT HEART CATH AND CORONARY ANGIOGRAPHY Left 06/29/2017   Procedure: LEFT HEART CATH AND CORONARY ANGIOGRAPHY;  Surgeon: [name], MD;  Location: [place] [facility];  Service: Cardiovascular;  Laterality: Left;  . LYMPH NODE DISSECTION Left 04/16/2019   Procedure: Left inguinal Lymph Node Dissection;  Surgeon: [name], MD;  Location: [place];  Service: General;  Laterality: Left;  Marland Kitchen MELANOMA EXCISION Left 04/16/2019   Procedure: MELANOMA EXCISION LEFT GROIN MASS;  Surgeon: [name], MD;  Location: [place];  Service: General;  Laterality: Left;  Marland Kitchen MELANOMA EXCISION WITH SENTINEL LYMPH NODE BIOPSY Left 2012   Left calf   . PACEMAKER INSERTION N/A 08/26/2018   Procedure: INSERTION PACEMAKER;  Surgeon: [name], MD;  Location: [facility];  Service: Cardiovascular;  Laterality: N/A;  . PORTA CATH INSERTION N/A 08/26/2019   Procedure: PORTA CATH  INSERTION;  Surgeon: Katha Cabal, MD;  Location: [place] [facility];  Service: Cardiovascular;  Laterality: N/A;  . TEMPORARY PACEMAKER N/A 08/25/2018   Procedure: TEMPORARY PACEMAKER;  Surgeon: [name], MD;  Location: [place] [facility];  Service: Cardiovascular;  Laterality: N/A;    There were no vitals filed for this visit.   Subjective Assessment - 12/10/19 1438    Subjective  Tariq Pernell presents to OT for visit 35/36 to address LLE lymphedema 2/2 melanoma Rx. Mr Innis presents wearing off the shelf compression garment. Pt reports lower leg swelling seems well controlled with OTS compression stocking, but thigh still swells. Mr Humann reports L knee pain is unchanged.    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 w/ LN disection (-7/7 LN); Completed adjuvant XRT 07/07/2019; 08/2018 Pacemaker placed; L knee sx date?;    Limitations chronic LLE knee pain; post surgical LLE pain,  chronic LLE/LLQ swelling, decreased LLE strength, impaired gait, decreased dynamic balance, altered sensation LLE, increased risk of infection, decreased standing and walking tolerance > 1 hr; decreased ankle AROM    Repetition Increases Symptoms    Special Tests +Stemmer base L toes    Patient Stated Goals reduce swelling to normal and keep it from getting worse    Pain Onset More than a month ago  OT Education - 12/10/19 1536    Education Details Continued skilled Pt/caregiver education  And LE ADL training throughout visit for lymphedema self care/ home program, including compression wrapping, compression garment and device wear/care, lymphatic pumping ther ex, simple self-MLD, and skin care. Discussed progress towards goals.    Person(s) Educated Patient;Spouse    Methods Explanation;Demonstration;Handout    Comprehension Verbalized understanding;Returned demonstration;Need further  instruction               OT Long Term Goals - 11/10/19 0957      OT LONG TERM GOAL #1   Title Pt will be able to apply thigh length, multi-layer, short stretch compression wraps daily to single limb using correct gradient techniques with Maximum assistance to achieve optimal limb volume reduction, to return affected limb/s, as closely as possible, to premorbid size and shape, to limit infection risk, and to improve safe functional ambulation and mobility.    Status Achieved   Pt unable to bend left knee to extent needed to appy compression garments independently. Cargiver is independent with gradient techniques.     OT LONG TERM GOAL #2   Title Pt will be able to verbalize signs and symptoms of cellulitis infection and identify at least 4 common lymphedema precautions using printed resource for reference to limit LE progression over time to limit risk of infection and LE exacerbation.    Status Achieved      OT LONG TERM GOAL #3   Status Achieved      OT LONG TERM GOAL #4   Status Achieved   achieved this date.. Goal met for LLE     OT LONG TERM GOAL #5   Status Achieved      OT LONG TERM GOAL #6   Status Partially Met   At present requires Max A as he cannot reach feet to don stockings                Plan - 12/10/19 1538    Clinical Impression Statement Pt was unable to wear custom stocking to clinic today as he is unable to don it without assistance, and his Cherylynn Ridges had gone to work. He will bring it next session so we can complete assessment and remeasure PRN. He mentioned today that top edge slides down intermittently. Pt tolerated MLD, skin care and Max A to don OTS thigh length garment . Today is last day of Rx for initial crtoification. Pt continues to have mild swel;ling throughout the LLE, so we will continue with manual therapy through the 2nd week in November when cooler temperatures may reduce current degree of swelling. Extra visits will also enable [place] to complete  final fittings for custom garments. Request additional 36 visits, and cancel unneeded visits .    OT Occupational Profile and History Comprehensive Assessment- Review of records and extensive additional review of physical, cognitive, psychosocial history related to current functional performance    Occupational performance deficits (Please refer to evaluation for details): ADL's;IADL's;Rest and Sleep;Work;Leisure;Social Participation;Other   body image, role performance   Body Structure / Function / Physical Skills ADL;ROM;Scar mobility;IADL;Edema;Balance;Sensation;Skin integrity;Mobility;Flexibility;Strength;Decreased knowledge of precautions;Gait;Pain;Decreased knowledge of use of DME    Rehab Potential Good    Clinical Decision Making Several treatment options, min-mod task modification necessary    Comorbidities Affecting Occupational Performance: Presence of comorbidities impacting occupational performance    Modification or Assistance to Complete Evaluation  Min-Moderate modification of tasks or assist with assess necessary to complete eval    OT  Frequency 2x / week    OT Duration 12 weeks   and PRN   OT Treatment/Interventions Self-care/ADL training;Therapeutic exercise;Functional Mobility Training;Manual Therapy;Coping strategies training;Therapeutic activities;Manual lymph drainage;Energy conservation;DME and/or AE instruction;Compression bandaging;Other (comment);Scar mobilization;Patient/family education   fit with appropriate compression garments once swelling is reduced   Plan Complete Decongestive Therapy (CDT), Intensive and Management Phases to include  manual lymphatyic drainage (MLD), skin care, therapeutic exercise, compression with short stretch wraps and garments, Pt edu for LE sef care    Consulted and Agree with Plan of Care Patient           Patient will benefit from skilled therapeutic intervention in order to improve the following deficits and impairments:   Body  Structure / Function / Physical Skills: ADL, ROM, Scar mobility, IADL, Edema, Balance, Sensation, Skin integrity, Mobility, Flexibility, Strength, Decreased knowledge of precautions, Gait, Pain, Decreased knowledge of use of DME       Visit Diagnosis: Lymphedema, not elsewhere classified - Plan: Ot plan of care cert/re-cert    Problem List Patient Active Problem List   Diagnosis Date Noted  . Arthritis of left knee 11/27/2019  . Vitiligo 09/03/2019  . Port-A-Cath in place 08/20/2019  . Encounter for antineoplastic immunotherapy 08/06/2019  . Malignant melanoma of overlapping sites ([place]) 04/23/2019  . Goals of care, counseling/discussion 04/23/2019  . 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 Near syncope 12/17/2018  . Anemia in chronic kidney disease 12/11/2018  . Benign hypertensive kidney disease with chronic kidney disease 12/11/2018  . Stage 3a chronic kidney disease 12/11/2018  . AKI (acute kidney injury) ([place]) 08/24/2018  . Acute hyperkalemia 08/24/2018  . HTN (hypertension) 08/24/2018  . HLD (hyperlipidemia) 08/24/2018  . Seizures ([place]) 08/24/2018  . Complete heart block ([place]) 08/24/2018  . Arthritis of wrist, left 06/12/2018  . Bradycardia 06/04/2018  . 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 Positive ANA (antinuclear antibody) 03/05/2018  . Swelling of joint of left wrist 03/05/2018  . Coronary artery disease involving native coronary artery of native heart 07/09/2017  . Palpitations 07/09/2017  . Stable angina ([place]) 06/27/2017  . Benign essential HTN 05/25/2017  . LBBB (left bundle branch block) 05/25/2017    Andrey Spearman, MS, OTR/L, [facility] 12/10/19 3:45 PM  [place] MAIN [facility] SERVICES [address] [place], [place], [address] Phone: [phone]   Fax:  [phone]  Name: Darral Rishel MRN: [id number] Date of Birth: 03/16/1961

## 2019-12-11 ENCOUNTER — Encounter: Payer: Self-pay | Admitting: Oncology

## 2019-12-11 ENCOUNTER — Inpatient Hospital Stay (HOSPITAL_BASED_OUTPATIENT_CLINIC_OR_DEPARTMENT_OTHER): Payer: 59 | Admitting: Oncology

## 2019-12-11 ENCOUNTER — Other Ambulatory Visit: Payer: Self-pay | Admitting: Oncology

## 2019-12-11 ENCOUNTER — Inpatient Hospital Stay: Payer: 59

## 2019-12-11 VITALS — BP 143/85 | HR 67 | Temp 97.0°F | Resp 18 | Wt 235.9 lb

## 2019-12-11 DIAGNOSIS — L80 Vitiligo: Secondary | ICD-10-CM

## 2019-12-11 DIAGNOSIS — C438 Malignant melanoma of overlapping sites of skin: Secondary | ICD-10-CM

## 2019-12-11 DIAGNOSIS — Z5112 Encounter for antineoplastic immunotherapy: Secondary | ICD-10-CM

## 2019-12-11 DIAGNOSIS — I890 Lymphedema, not elsewhere classified: Secondary | ICD-10-CM | POA: Diagnosis not present

## 2019-12-11 LAB — CBC WITH DIFFERENTIAL/PLATELET
Abs Immature Granulocytes: 0.04 10*3/uL (ref 0.00–0.07)
Basophils Absolute: 0 10*3/uL (ref 0.0–0.1)
Basophils Relative: 0 %
Eosinophils Absolute: 0.3 10*3/uL (ref 0.0–0.5)
Eosinophils Relative: 6 %
HCT: 35.3 % — ABNORMAL LOW (ref 39.0–52.0)
Hemoglobin: 12 g/dL — ABNORMAL LOW (ref 13.0–17.0)
Immature Granulocytes: 1 %
Lymphocytes Relative: 19 %
Lymphs Abs: 0.9 10*3/uL (ref 0.7–4.0)
MCH: 30 pg (ref 26.0–34.0)
MCHC: 34 g/dL (ref 30.0–36.0)
MCV: 88.3 fL (ref 80.0–100.0)
Monocytes Absolute: 0.5 10*3/uL (ref 0.1–1.0)
Monocytes Relative: 11 %
Neutro Abs: 2.9 10*3/uL (ref 1.7–7.7)
Neutrophils Relative %: 63 %
Platelets: 201 10*3/uL (ref 150–400)
RBC: 4 MIL/uL — ABNORMAL LOW (ref 4.22–5.81)
RDW: 13.1 % (ref 11.5–15.5)
WBC: 4.6 10*3/uL (ref 4.0–10.5)
nRBC: 0 % (ref 0.0–0.2)

## 2019-12-11 LAB — COMPREHENSIVE METABOLIC PANEL
ALT: 39 U/L (ref 0–44)
AST: 28 U/L (ref 15–41)
Albumin: 4.2 g/dL (ref 3.5–5.0)
Alkaline Phosphatase: 80 U/L (ref 38–126)
Anion gap: 8 (ref 5–15)
BUN: 25 mg/dL — ABNORMAL HIGH (ref 6–20)
CO2: 27 mmol/L (ref 22–32)
Calcium: 9.1 mg/dL (ref 8.9–10.3)
Chloride: 104 mmol/L (ref 98–111)
Creatinine, Ser: 1.16 mg/dL (ref 0.61–1.24)
GFR calc Af Amer: >60 mL/min (ref >60)
GFR calc non Af Amer: >60 mL/min (ref >60)
Glucose, Bld: 118 mg/dL — ABNORMAL HIGH (ref 70–99)
Potassium: 3.8 mmol/L (ref 3.5–5.1)
Sodium: 139 mmol/L (ref 135–145)
Total Bilirubin: 0.8 mg/dL (ref 0.3–1.2)
Total Protein: 7.3 g/dL (ref 6.5–8.1)

## 2019-12-11 LAB — LACTATE DEHYDROGENASE: LDH: 126 U/L (ref 98–192)

## 2019-12-11 LAB — TSH: TSH: 2.465 u[IU]/mL (ref 0.350–4.500)

## 2019-12-11 MED ORDER — HEPARIN SOD (PORK) LOCK FLUSH 100 UNIT/ML IV SOLN
INTRAVENOUS | Status: AC
  Filled 2019-12-11: qty 5

## 2019-12-11 MED ORDER — SODIUM CHLORIDE 0.9 % IV SOLN
240.0000 mg | Freq: Once | INTRAVENOUS | Status: AC
  Administered 2019-12-11: 240 mg via INTRAVENOUS
  Filled 2019-12-11: qty 24

## 2019-12-11 MED ORDER — SODIUM CHLORIDE 0.9 % IV SOLN
Freq: Once | INTRAVENOUS | Status: AC
  Filled 2019-12-11: qty 250

## 2019-12-11 MED ORDER — HEPARIN SOD (PORK) LOCK FLUSH 100 UNIT/ML IV SOLN
500.0000 [IU] | Freq: Once | INTRAVENOUS | Status: AC | PRN
  Administered 2019-12-11: 500 [IU]
  Filled 2019-12-11: qty 5

## 2019-12-11 NOTE — Progress Notes (Signed)
Hematology/Oncology follow up  note [facility] [phone] Fax:([phone]   Patient Care Team: [name], [name], MD as PCP - General (Student)  REFERRING PROVIDER: Cherylann Parr, MD  CHIEF COMPLAINTS/REASON FOR VISIT:  Follow up for melanoma HISTORY OF PRESENTING ILLNESS:   Edward Pitts is a  59 y.o.  male with PMH listed below was seen in consultation at the request of  [name], [name], MD  for evaluation of inguinal mass Patient presented to emergency room 3 days ago complaining about left ing uinal mass discomfort. Reports that he has really noticed the mass growing for the past 1 months. He has a history of left lower extremity melanoma in 2011, status post local excision.  Pain was increased with squatting of laxation. He was advised to take Tylenol for pain. Denies any fever, chills, night sweating.  He does feel mild nauseated. Appetite is fair.  He has lost about 10 pounds since earlier this year. 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  Left inguinal and left iliac nodes which are suspicious for involvement.  There are also 2 small nonspecific hypodense lesions within the right liver, nonspecific.  # patient underwent left groin mass resection On 04/16/2019. Resection pathology showed malignant melanoma, replacing a lymph node, with extracapsular extension, peripheral and deep margins involved.  Left inguinal contents, all 7 lymph nodes were negative for melanoma in the lymph nodes.  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  # PDL1 80% TPS  # 04/16/2019. underwent left groin mass resection   07/07/2019  Status post adjuvant radiation and finished radiation  Patient has Mediport placed to facilitate immunotherapy treatments. INTERVAL HISTORY Edward Pitts is a 59 y.o. male who has above history  reviewed by me today presents for follow up visit for management of inguinal nodal recurrence of melanoma Problems and complaints are listed below: Patient has no new complaints.  He has had interval CT surveillance done.  C hronic bilateral upper extremity hypopigmentation, unchanged.  . Review of Systems  Constitutional: Negative for appetite change, chills, fatigue, fever and unexpected weight change.  HENT:   Negative for hearing loss and voice change.   Eyes: Negative for eye problems and icterus.  Respiratory: Negative for chest tightness, cough and shortness of breath.   Cardiovascular: Negative for chest pain and leg swelling.  Gastrointestinal: Negative for abdominal distention and abdominal pain.  Endocrine: Negative for hot flashes.  Genitourinary: Negative for difficulty urinating, dysuria and frequency.   Musculoskeletal: Positive for arthralgias.  Skin: Negative for itching and rash.       Skin hypo-pigmentation on upper extremities, no change  Neurological: Negative for light-headedness and numbness.  Hematological: Negative for adenopathy. Does not bruise/bleed easily.  Psychiatric/Behavioral: Negative for confusion.    MEDICAL HISTORY:  Past Medical History:  Diagnosis Date  . Anxiety   . Arthritis   . Complication of anesthesia   . Family history of adverse reaction to anesthesia    PONV mother  . Hyperlipidemia   . Hypertension   . Melanoma ([place]) 2012  . PONV (postoperative nausea and vomiting) 04/16/2019  . Presence of permanent cardiac pacemaker    Medtronic  . Seizures ([place])     SURGICAL HISTORY: Past Surgical History:  Procedure Laterality Date  . KNEE SURGERY Left   . LEFT HEART CATH AND CORONARY ANGIOGRAPHY Left 06/29/2017  Procedure: LEFT HEART CATH AND CORONARY ANGIOGRAPHY;  Surgeon: [name], MD;  Location: [place] [facility];  Service: Cardiovascular;  Laterality: Left;  . LYMPH NODE DISSECTION Left 04/16/2019   Procedure: Left  inguinal Lymph Node Dissection;  Surgeon: [name], MD;  Location: [place];  Service: General;  Laterality: Left;  Marland Kitchen MELANOMA EXCISION Left 04/16/2019   Procedure: MELANOMA EXCISION LEFT GROIN MASS;  Surgeon: [name], MD;  Location: [place];  Service: General;  Laterality: Left;  Marland Kitchen MELANOMA EXCISION WITH SENTINEL LYMPH NODE BIOPSY Left 2012   Left calf   . PACEMAKER INSERTION N/A 08/26/2018   Procedure: INSERTION PACEMAKER;  Surgeon: [name], MD;  Location: [facility];  Service: Cardiovascular;  Laterality: N/A;  . PORTA CATH INSERTION N/A 08/26/2019   Procedure: PORTA CATH INSERTION;  Surgeon: Katha Cabal, MD;  Location: [place] [facility];  Service: Cardiovascular;  Laterality: N/A;  . TEMPORARY PACEMAKER N/A 08/25/2018   Procedure: TEMPORARY PACEMAKER;  Surgeon: [name], MD;  Location: [place] [facility];  Service: Cardiovascular;  Laterality: N/A;    SOCIAL HISTORY: Social History   Socioeconomic History  . Marital status: Married    Spouse name: Vicente Males   . Number of children: 7  . Years of education: 59  . Highest education level: Not on file  Occupational History  . Occupation: [employer]   Tobacco Use  . Smoking status: Never Smoker  . Smokeless tobacco: Never Used  Vaping Use  . Vaping Use: Never used  Substance and Sexual Activity  . Alcohol use: No  . Drug use: No  . Sexual activity: Not on file  Other Topics Concern  . Not on file  Social History Narrative   Lives with mother, wife and sister   Caffeine use: sodas (2 per day)   Social Determinants of Health   Financial Resource Strain: Low Risk   . Difficulty of Paying Living Expenses: Not hard at all  Food Insecurity: No Food Insecurity  . Worried About Charity fundraiser in the Last Year: Never true  . Ran Out of Food in the Last Year: Never true  Transportation Needs: Unmet Transportation Needs  . Lack of Transportation (Medical): Yes  . Lack of Transportation (Non-Medical): Yes   Physical Activity: Unknown  . Days of Exercise per Week: 0 days  . Minutes of Exercise per Session: Not on file  Stress: No Stress Concern Present  . Feeling of Stress : Only a little  Social Connections: Unknown  . Frequency of Communication with Friends and Family: More than three times a week  . Frequency of Social Gatherings with Friends and Family: Not on file  . Attends Religious Services: Not on file  . Active Member of Clubs or Organizations: Not on file  . Attends Archivist Meetings: Not on file  . Marital Status: Married  Human resources officer Violence: Not At Risk  . Fear of Current or Ex-Partner: No  . Emotionally Abused: No  . Physically Abused: No  . Sexually Abused: No    FAMILY HISTORY: Family History  Problem Relation Age of Onset  . Cancer Paternal Grandmother     ALLERGIES:  is allergic to ibuprofen and nsaids.  MEDICATIONS:  Current Outpatient Medications  Medication Sig Dispense Refill  . acetaminophen (TYLENOL) 650 MG CR tablet Take 650 mg by mouth every 8 (eight) hours as needed for pain.     Marland Kitchen allopurinol (ZYLOPRIM) 300 MG tablet Take 300 mg by  mouth daily.    Marland Kitchen aspirin 81 MG chewable tablet Chew 81 mg by mouth daily.    Marland Kitchen atorvastatin (LIPITOR) 20 MG tablet Take 20 mg by mouth daily.    . clonazePAM (KLONOPIN) 0.5 MG tablet 1 tablet in the morning, 2 in the evening (Patient taking differently: Take 0.5 mg by mouth daily. Takes 2 tabs as needed.) 90 tablet 3  . hydrocortisone 2.5 % cream Apply topically daily as needed.    Marland Kitchen ketoconazole (NIZORAL) 2 % cream Apply 1 application topically daily as needed for irritation.    . lidocaine-prilocaine (EMLA) cream Apply 1 application topically as needed. Apply small amount of cream to port site approx 1-2 hours prior to appointment. 30 g 2  . lisinopril (ZESTRIL) 20 MG tablet Take 20 mg by mouth daily.    . metoprolol succinate (TOPROL-XL) 25 MG 24 hr tablet Take 25 mg by mouth daily.     . Multiple  Vitamin (MULTIVITAMIN WITH MINERALS) TABS tablet Take 1 tablet by mouth daily. Centrum Silver    . ondansetron (ZOFRAN) 8 MG tablet Take 1 tablet (8 mg total) by mouth every 8 (eight) hours as needed for nausea, vomiting or refractory nausea / vomiting. 30 tablet 0  . silver sulfADIAZINE (SILVADENE) 1 % cream Apply 1 application topically 2 (two) times daily. (Patient not taking: Reported on 08/06/2019) 50 g 2  . traMADol (ULTRAM) 50 MG tablet Take 1 tablet (50 mg total) by mouth every 6 (six) hours as needed. (Patient not taking: Reported on 08/19/2019) 30 tablet 0   No current facility-administered medications for this visit.     PHYSICAL EXAMINATION: ECOG PERFORMANCE STATUS: 0 - Asymptomatic Vitals:   12/11/19 0906  BP: (!) 143/85  Pulse: 67  Resp: 18  Temp: (!) 97 F (36.1 C)   Filed Weights   12/11/19 0906  Weight: 235 lb 14.4 oz (107 kg)    Physical Exam Constitutional:      General: He is not in acute distress. HENT:     Head: Normocephalic and atraumatic.  Eyes:     General: No scleral icterus.    Pupils: Pupils are equal, round, and reactive to light.  Cardiovascular:     Rate and Rhythm: Normal rate and regular rhythm.     Heart sounds: Normal heart sounds.  Pulmonary:     Effort: Pulmonary effort is normal. No respiratory distress.     Breath sounds: No wheezing.  Abdominal:     General: Bowel sounds are normal. There is no distension.     Palpations: Abdomen is soft. There is no mass.     Tenderness: There is no abdominal tenderness.     Comments:  History left groin mass resection and radiation.   Musculoskeletal:        General: No deformity. Normal range of motion.     Cervical back: Normal range of motion and neck supple.     Comments: Left lower extremity edema  Skin:    General: Skin is warm and dry.     Findings: No erythema or rash.  Neurological:     Mental Status: He is alert and oriented to person, place, and time. Mental status is at baseline.      Cranial Nerves: No cranial nerve deficit.     Coordination: Coordination normal.  Psychiatric:        Mood and Affect: Mood normal.    LABORATORY DATA:  I have reviewed the data as listed Lab Results  Component Value Date   WBC 4.4 11/27/2019   HGB 11.6 (L) 11/27/2019   HCT 33.8 (L) 11/27/2019   MCV 88.5 11/27/2019   PLT 194 11/27/2019   Recent Labs    10/30/19 0807 11/13/19 0828 11/27/19 0833  NA 136 141 140  K 5.2* 3.9 4.1  CL 107 109 106  CO2 _0 GLUCOSE 118* 119* 125*  BUN 42* 21* 20  CREATININE 1.38* 1.24 1.10  CALCIUM 9.1 8.9 9.0  GFRNONAA 55* >60 >60  GFRAA >60 >60 >60  PROT 7.3 7.4 7.1  ALBUMIN 4.4 4.1 4.0  AST _1 ALT 33 34 40  ALKPHOS 88 81 89  BILITOT 0.5 0.6 0.6   Iron/TIBC/Ferritin/ %Sat    Component Value Date/Time   IRON 85 10/02/2019 0841   TIBC 342 10/02/2019 0841   FERRITIN 61 10/02/2019 0841   IRONPCTSAT 25 10/02/2019 0841      RADIOGRAPHIC STUDIES: I have personally reviewed the radiological images as listed and agreed with the findings in the report. CT CHEST ABDOMEN PELVIS W CONTRAST  Result Date: 12/10/2019 CLINICAL DATA:  Malignant melanoma. LEFT calf excision 2012. Recurrence to LEFT inguinal lymph node January 21. EXAM: CT CHEST, ABDOMEN, AND PELVIS WITH CONTRAST TECHNIQUE: Multidetector CT imaging of the chest, abdomen and pelvis was performed following the standard protocol during bolus administration of intravenous contrast. CONTRAST:  150m OMNIPAQUE IOHEXOL 300 MG/ML  SOLN COMPARISON:  CT 09/10/2019, PET-CT 02/11/2019 FINDINGS: CT CHEST FINDINGS Cardiovascular: Port in the anterior chest wall with tip in distal SVC. LEFT-sided pacemaker noted. No significant vascular findings. Normal heart size. No pericardial effusion. Mediastinum/Nodes: No axillary or supraclavicular adenopathy. No mediastinal or hilar adenopathy. No pericardial fluid. Esophagus normal. Lungs/Pleura: No suspicious pulmonary nodules. Musculoskeletal:  No aggressive osseous lesion. CT ABDOMEN AND PELVIS FINDINGS Hepatobiliary: Hypodense lesion in the RIGHT hepatic lobe measuring 7 mm unchanged. Similar smaller lesion on image 65/2 is also unchanged. Normal gallbladder. Pancreas: Pancreas is normal. No ductal dilatation. No pancreatic inflammation. Spleen: Normal spleen Adrenals/urinary tract: Adrenal glands and kidneys are normal. The ureters and bladder normal. Stomach/Bowel: Stomach, small-bowel and cecum are normal. The appendix is not identified but there is no pericecal inflammation to suggest appendicitis. The colon and rectosigmoid colon are normal. Vascular/Lymphatic: Abdominal aorta is normal caliber. There is no retroperitoneal or periportal lymphadenopathy. No pelvic lymphadenopathy. 6 mm gastrohepatic ligament lymph node (image 58/2) is unchanged Reproductive: Unremarkable Other: Postsurgical change in LEFT groin consistent prior groin mass resection. No evidence of local recurrence. Musculoskeletal: No aggressive osseous lesion. IMPRESSION: Chest Impression: No evidence of thoracic metastasis Abdomen / Pelvis Impression: 1. Post LEFT groin mass resection.  No evidence local recurrence. 2. No evidence of metastatic disease the abdomen pelvis. 3. No evidence skeletal metastasis. 4. Stable small hypodensities in the RIGHT hepatic lobe. Electronically Signed   By: SSuzy BouchardM.D.   On: 12/10/2019 11:13       ASSESSMENT & PLAN:  1. Malignant melanoma of overlapping sites ([facility]   2. Encounter for antineoplastic immunotherapy   3. Vitiligo    #Left inguinal nodal recurrence of melanoma.  Status post resection and adjuvant radiation. Labs are reviewed and discussed with patient. Counts are acceptable to proceed with today's nivolumab treatment.   CT chest abdomen pelvis was independantly reviewed by me and discussed with patient.   # Vitiligo, stable.  # Normocytic anemia, stable. Hemoglobin improved.  # Left knee arthritis knee pain  [facility]  All questions were answered. The patient knows to call the clinic with any problems questions or concerns.  Follow-up with 2 weeks. We spent sufficient time to discuss many aspect of care, questions were answered to patient's satisfaction.   [name], MD, PhD Hematology Oncology [facility] Pager- [phone] 12/11/2019

## 2019-12-11 NOTE — Progress Notes (Signed)
Pt here for follow up. Pt wanting to know if its ok to take 3rd covid vaccine and flu shot at the same time.

## 2019-12-17 ENCOUNTER — Other Ambulatory Visit: Payer: Self-pay

## 2019-12-17 ENCOUNTER — Ambulatory Visit: Payer: 59 | Admitting: Occupational Therapy

## 2019-12-17 DIAGNOSIS — I890 Lymphedema, not elsewhere classified: Secondary | ICD-10-CM | POA: Diagnosis not present

## 2019-12-17 NOTE — Therapy (Signed)
[facility] [address] [place], [place], [address] Phone: [phone]   Fax:  [phone]  Occupational Therapy Treatment  Patient Details  Name: Edward Pitts MRN: [id number] Date of Birth: 04/05/1960 Referring Provider (OT): Zhou Yu, MD   Encounter Date: 12/17/2019   OT End of Session - 12/17/19 1302    Visit Number 37    Number of Visits 72    Date for OT Re-Evaluation 03/09/20    OT Start Time 0102    OT Stop Time 0210    OT Time Calculation (min) 68 min    Activity Tolerance Patient tolerated treatment well;No increased pain;Other (comment)   decreased hip and knee AROM and joint pain. Pt is unable to reach feet to don lace up shoes, socks and compression garments without Max A   Behavior During Therapy WFL for tasks assessed/performed           Past Medical History:  Diagnosis Date  . Anxiety   . Arthritis   . Complication of anesthesia   . Family history of adverse reaction to anesthesia    PONV mother  . Hyperlipidemia   . Hypertension   . Melanoma ([facility]) 2012  . PONV (postoperative nausea and vomiting) 04/16/2019  . Presence of permanent cardiac pacemaker    Medtronic  . Seizures ([facility])     Past Surgical History:  Procedure Laterality Date  . KNEE SURGERY Left   . LEFT HEART CATH AND CORONARY ANGIOGRAPHY Left 06/29/2017   Procedure: LEFT HEART CATH AND CORONARY ANGIOGRAPHY;  Surgeon: Kowalski, Bruce J, MD;  Location: [facility];  Service: Cardiovascular;  Laterality: Left;  . LYMPH NODE DISSECTION Left 04/16/2019   Procedure: Left inguinal Lymph Node Dissection;  Surgeon: Byerly, Faera, MD;  Location: [facility];  Service: General;  Laterality: Left;  . MELANOMA EXCISION Left 04/16/2019   Procedure: MELANOMA EXCISION LEFT GROIN MASS;  Surgeon: Byerly, Faera, MD;  Location: [facility];  Service: General;  Laterality: Left;  . MELANOMA EXCISION WITH SENTINEL LYMPH NODE BIOPSY Left 2012   Left calf   .  PACEMAKER INSERTION N/A 08/26/2018   Procedure: INSERTION PACEMAKER;  Surgeon: Paraschos, Alexander, MD;  Location: [facility];  Service: Cardiovascular;  Laterality: N/A;  . PORTA CATH INSERTION N/A 08/26/2019   Procedure: PORTA CATH INSERTION;  Surgeon: Schnier, Gregory G, MD;  Location: [facility];  Service: Cardiovascular;  Laterality: N/A;  . TEMPORARY PACEMAKER N/A 08/25/2018   Procedure: TEMPORARY PACEMAKER;  Surgeon: Cooper, Michael, MD;  Location: [facility];  Service: Cardiovascular;  Laterality: N/A;    There were no vitals filed for this visit.   Subjective Assessment - 12/17/19 1310    Subjective  Edward Pitts presents to OT for visit 37/72 to address LLE lymphedema 2/2 melanoma Rx. Edward Pitts presents wearing custom compression garment that is not positioned correctly at top edge.  Pt states he has a difficult time pulling this garment all the way up by himself due to difficulty reach to ankle and distal leg to grasp fabric and distribute it correctly.    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 w/ LN disection (-7/7 LN); Completed adjuvant XRT 07/07/2019; 08/2018 Pacemaker placed; L knee sx date?;    Limitations chronic LLE knee pain; post surgical LLE pain,  chronic LLE/LLQ swelling, decreased LLE strength, impaired gait, decreased dynamic balance, altered sensation LLE, increased risk of infection,   decreased standing and walking tolerance > 1 hr; decreased ankle AROM    Repetition Increases Symptoms    Special Tests +Stemmer base L toes    Patient Stated Goals reduce swelling to normal and keep it from getting worse    Pain Onset More than a month ago                        OT Treatments/Exercises (OP) - 12/17/19 0001      ADLs   ADL Education Given Yes      Manual Therapy   Manual Therapy Edema management;Compression Bandaging    Manual therapy comments repeated anatomical measurements  for custom compression thigh high    Compression Bandaging Max A to don custom thigh length compression stocking using AE                  OT Education - 12/17/19 1311    Education Details Continued skilled Pt/caregiver education  And LE ADL training throughout visit for lymphedema self care/ home program, including compression wrapping, compression garment and device wear/care, lymphatic pumping ther ex, simple self-MLD, and skin care. Discussed progress towards goals.    Person(s) Educated Patient;Spouse    Methods Explanation;Demonstration;Handout    Comprehension Verbalized understanding;Returned demonstration;Need further instruction               OT Long Term Goals - 11/10/19 0957      OT LONG TERM GOAL #1   Title Pt will be able to apply thigh length, multi-layer, short stretch compression wraps daily to single limb using correct gradient techniques with Maximum assistance to achieve optimal limb volume reduction, to return affected limb/s, as closely as possible, to premorbid size and shape, to limit infection risk, and to improve safe functional ambulation and mobility.    Status Achieved   Pt unable to bend left knee to extent needed to appy compression garments independently. Cargiver is independent with gradient techniques.     OT LONG TERM GOAL #2   Title Pt will be able to verbalize signs and symptoms of cellulitis infection and identify at least 4 common lymphedema precautions using printed resource for reference to limit LE progression over time to limit risk of infection and LE exacerbation.    Status Achieved      OT LONG TERM GOAL #3   Status Achieved      OT LONG TERM GOAL #4   Status Achieved   achieved this date.. Goal met for LLE     OT LONG TERM GOAL #5   Status Achieved      OT LONG TERM GOAL #6   Status Partially Met   At present requires Max A as he cannot reach feet to don stockings                Plan - 12/17/19 1645    Clinical  Impression Statement Repeated LLE anatomical measurements to order remake of custom compression garment. Lengthened distance between landmarks slightly, removed tricot patch at anterior foot to ease donning. Retained ccl 3. Max A to don stocking at end of session.    OT Occupational Profile and History Comprehensive Assessment- Review of records and extensive additional review of physical, cognitive, psychosocial history related to current functional performance    Occupational performance deficits (Please refer to evaluation for details): ADL's;IADL's;Rest and Sleep;Work;Leisure;Social Participation;Other   body image, role performance   Body Structure / Function / Physical Skills ADL;ROM;Scar mobility;IADL;Edema;Balance;Sensation;Skin integrity;Mobility;Flexibility;Strength;Decreased knowledge   of precautions;Gait;Pain;Decreased knowledge of use of DME    Rehab Potential Good    Clinical Decision Making Several treatment options, min-mod task modification necessary    Comorbidities Affecting Occupational Performance: Presence of comorbidities impacting occupational performance    Modification or Assistance to Complete Evaluation  Min-Moderate modification of tasks or assist with assess necessary to complete eval    OT Frequency 2x / week    OT Duration 12 weeks   and PRN   OT Treatment/Interventions Self-care/ADL training;Therapeutic exercise;Functional Mobility Training;Manual Therapy;Coping strategies training;Therapeutic activities;Manual lymph drainage;Energy conservation;DME and/or AE instruction;Compression bandaging;Other (comment);Scar mobilization;Patient/family education   fit with appropriate compression garments once swelling is reduced   Plan Complete Decongestive Therapy (CDT), Intensive and Management Phases to include  manual lymphatyic drainage (MLD), skin care, therapeutic exercise, compression with short stretch wraps and garments, Pt edu for LE sef care    Consulted and Agree with  Plan of Care Patient           Patient will benefit from skilled therapeutic intervention in order to improve the following deficits and impairments:   Body Structure / Function / Physical Skills: ADL, ROM, Scar mobility, IADL, Edema, Balance, Sensation, Skin integrity, Mobility, Flexibility, Strength, Decreased knowledge of precautions, Gait, Pain, Decreased knowledge of use of DME       Visit Diagnosis: Lymphedema, not elsewhere classified    Problem List Patient Active Problem List   Diagnosis Date Noted  . Arthritis of left knee 11/27/2019  . Vitiligo 09/03/2019  . Port-A-Cath in place 08/20/2019  . Encounter for antineoplastic immunotherapy 08/06/2019  . Malignant melanoma of overlapping sites ([place]) 04/23/2019  . Goals of care, counseling/discussion 04/23/2019  . 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 Near syncope 12/17/2018  . Anemia in chronic kidney disease 12/11/2018  . Benign hypertensive kidney disease with chronic kidney disease 12/11/2018  . Stage 3a chronic kidney disease 12/11/2018  . AKI (acute kidney injury) ([place]) 08/24/2018  . Acute hyperkalemia 08/24/2018  . HTN (hypertension) 08/24/2018  . HLD (hyperlipidemia) 08/24/2018  . Seizures ([place]) 08/24/2018  . Complete heart block ([place]) 08/24/2018  . Arthritis of wrist, left 06/12/2018  . Bradycardia 06/04/2018  . 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 Positive ANA (antinuclear antibody) 03/05/2018  . Swelling of joint of left wrist 03/05/2018  . Coronary artery disease involving native coronary artery of native heart 07/09/2017  . Palpitations 07/09/2017  . Stable angina ([place]) 06/27/2017  . Benign essential HTN 05/25/2017  . LBBB (left bundle branch block) 05/25/2017    Edward Spearman, MS, OTR/L, [facility] 12/17/19 4:48 PM  [place] [facility] [address] [place], [place], [address] Phone: [phone]   Fax:   [phone]  Name: Edward Pitts MRN: [id number] Date of Birth: 1960-06-16

## 2019-12-22 ENCOUNTER — Encounter: Payer: Self-pay | Admitting: Neurology

## 2019-12-22 ENCOUNTER — Ambulatory Visit (INDEPENDENT_AMBULATORY_CARE_PROVIDER_SITE_OTHER): Payer: 59 | Admitting: Neurology

## 2019-12-22 VITALS — BP 129/69 | HR 82 | Ht 67.0 in | Wt 235.0 lb

## 2019-12-22 DIAGNOSIS — R42 Dizziness and giddiness: Secondary | ICD-10-CM

## 2019-12-22 DIAGNOSIS — R55 Syncope and collapse: Secondary | ICD-10-CM

## 2019-12-22 HISTORY — DX: Dizziness and giddiness: R42

## 2019-12-22 MED ORDER — CLONAZEPAM 0.5 MG PO TABS
ORAL_TABLET | ORAL | 3 refills | Status: DC
Start: 2019-12-22 — End: 2020-06-07

## 2019-12-22 NOTE — Progress Notes (Signed)
Reason for visit: Dizziness, anxiety, near syncope  Edward Pitts is an 59 y.o. male  History of present illness:  Edward Pitts is a 59 year old right-handed white male with a history of episodes of dizziness and near syncope, and some problems with anxiety.  The clonazepam has helped this some, he has been able to cut back on the medication taking 0.5 mg in the morning only.  More recently, he has had some troubles with episodes of dizziness.  He has a pacemaker in place for heart block, his cardiologist checks on this regularly.  He is not sleeping well, he will get to sleep and then wake up after 2 to 3 hours and cannot get back to sleep.  He sometimes will have a sensation that he is not breathing properly.  He will be going for a sleep study in the near future.  The patient returns to this office for an evaluation.  He is currently being treated for melanoma that was resected from the left groin, he has had some swelling in the left leg after they resected several lymph nodes.  He is getting chemotherapy for this.  Past Medical History:  Diagnosis Date  . Anxiety   . Arthritis   . Complication of anesthesia   . Family history of adverse reaction to anesthesia    PONV mother  . Hyperlipidemia   . Hypertension   . Melanoma ([place]) 2012  . PONV (postoperative nausea and vomiting) 04/16/2019  . Presence of permanent cardiac pacemaker    Medtronic  . Seizures ([place])     Past Surgical History:  Procedure Laterality Date  . KNEE SURGERY Left   . LEFT HEART CATH AND CORONARY ANGIOGRAPHY Left 06/29/2017   Procedure: LEFT HEART CATH AND CORONARY ANGIOGRAPHY;  Surgeon: [name], MD;  Location: [facility];  Service: Cardiovascular;  Laterality: Left;  . LYMPH NODE DISSECTION Left 04/16/2019   Procedure: Left inguinal Lymph Node Dissection;  Surgeon: [name], MD;  Location: [place];  Service: General;  Laterality: Left;  Marland Kitchen MELANOMA EXCISION Left 04/16/2019   Procedure:  MELANOMA EXCISION LEFT GROIN MASS;  Surgeon: [name], MD;  Location: [facility];  Service: General;  Laterality: Left;  Marland Kitchen MELANOMA EXCISION WITH SENTINEL LYMPH NODE BIOPSY Left 2012   Left calf   . PACEMAKER INSERTION N/A 08/26/2018   Procedure: INSERTION PACEMAKER;  Surgeon: [name], MD;  Location: [facility];  Service: Cardiovascular;  Laterality: N/A;  . PORTA CATH INSERTION N/A 08/26/2019   Procedure: PORTA CATH INSERTION;  Surgeon: Katha Cabal, MD;  Location: [facility];  Service: Cardiovascular;  Laterality: N/A;  . TEMPORARY PACEMAKER N/A 08/25/2018   Procedure: TEMPORARY PACEMAKER;  Surgeon: [name], MD;  Location: [facility];  Service: Cardiovascular;  Laterality: N/A;    Family History  Problem Relation Age of Onset  . Cancer Paternal Grandmother     Social history:  reports that he has never smoked. He has never used smokeless tobacco. He reports that he does not drink alcohol and does not use drugs.    Allergies  Allergen Reactions  . Ibuprofen Other (See Comments)    Affects kidneys  . Nsaids     Medications:  Prior to Admission medications   Medication Sig Start Date End Date Taking? Authorizing Provider  acetaminophen (TYLENOL) 650 MG CR tablet Take 650 mg by mouth every 8 (eight) hours as needed for pain.    Yes [provider]  allopurinol (ZYLOPRIM) 300 MG tablet Take 300 mg by mouth daily. 06/12/18  Yes [provider]  aspirin 81 MG chewable tablet Chew 81 mg by mouth daily.   Yes [provider]  atorvastatin (LIPITOR) 20 MG tablet Take 20 mg by mouth daily. 07/15/18  Yes [provider]  clonazePAM (KLONOPIN) 0.5 MG tablet 1 tablet in the morning, 2 in the evening Patient taking differently: Take 0.5 mg by mouth daily. Takes 2 tabs as needed. 08/18/19  Yes Kathrynn Ducking, MD  hydrocortisone 2.5 % cream Apply topically daily as needed.   Yes [provider]  ketoconazole  (NIZORAL) 2 % cream Apply 1 application topically daily as needed for irritation.   Yes [provider]  lidocaine-prilocaine (EMLA) cream Apply 1 application topically as needed. Apply small amount of cream to port site approx 1-2 hours prior to appointment. 08/26/19  Yes [name], MD  lisinopril (ZESTRIL) 20 MG tablet Take 20 mg by mouth daily.   Yes [provider]  metoprolol succinate (TOPROL-XL) 25 MG 24 hr tablet Take 25 mg by mouth daily.  12/15/18  Yes [provider]  Multiple Vitamin (MULTIVITAMIN WITH MINERALS) TABS tablet Take 1 tablet by mouth daily. Centrum Silver   Yes [provider]  silver sulfADIAZINE (SILVADENE) 1 % cream Apply 1 application topically 2 (two) times daily. 06/18/19  Yes Consuello Bossier, MD    ROS:  Out of a complete 14 system review of symptoms, the patient complains only of the following symptoms, and all other reviewed systems are negative.  Dizziness Leg swelling Insomnia  Blood pressure 129/69, pulse 82, height 5\' 7"  (1.702 m), weight 235 lb (106.6 kg).  Physical Exam  General: The patient is alert and cooperative at the time of the examination.  The patient is moderately to markedly obese.  Skin: Significant swelling of the entire left leg is noted including the lower leg and thigh.   Neurologic Exam  Mental status: The patient is alert and oriented x 3 at the time of the examination. The patient has apparent normal recent and remote memory, with an apparently normal attention span and concentration ability.   Cranial nerves: Facial symmetry is present. Speech is normal, no aphasia or dysarthria is noted. Extraocular movements are full. Visual fields are full.  Motor: The patient has good strength in all 4 extremities.  Sensory examination: Soft touch sensation is symmetric on the face, arms, and legs.  Coordination: The patient has good finger-nose-finger and heel-to-shin bilaterally.  Gait and  station: The patient has a normal gait. Tandem gait is normal. Romberg is negative. No drift is seen.  Reflexes: Deep tendon reflexes are symmetric.   Assessment/Plan:  1.  Episodic dizziness, possible anxiety  2.  History of near syncope, heart block, status post pacemaker placement  The patient is having some episodes of dizziness recently, we will go up on the clonazepam taking 0.5 mg twice daily, if he needs to he may drop to 1 in the morning and 2 in the evening.  A prescription will be sent in for the medication.  He will follow-up here in 1 year, sooner if needed.  Jill Alexanders MD 12/22/2019 10:11 AM  [facility] [address] [place], [place] [address]  Phone [phone] Fax [phone]

## 2019-12-24 ENCOUNTER — Ambulatory Visit: Payer: 59 | Admitting: Occupational Therapy

## 2019-12-24 ENCOUNTER — Other Ambulatory Visit: Payer: Self-pay

## 2019-12-24 DIAGNOSIS — I890 Lymphedema, not elsewhere classified: Secondary | ICD-10-CM

## 2019-12-24 NOTE — Therapy (Signed)
[name] [facility] SERVICES [address] [place], [place], [address] Phone: [phone]   Fax:  [phone]  Occupational Therapy Treatment  Patient Details  Name: Edward Pitts MRN: [id number] Date of Birth: 1960/08/08 Referring Provider (OT): [name], MD   Encounter Date: 12/24/2019   OT End of Session - 12/24/19 1647    Visit Number 38    Number of Visits 72    Date for OT Re-Evaluation 03/09/20    OT Start Time 0100    OT Stop Time 0200    OT Time Calculation (min) 60 min    Activity Tolerance Patient tolerated treatment well;No increased pain;Other (comment)   decreased hip and knee AROM and joint pain. Pt is unable to reach feet to don lace up shoes, socks and compression garments without Max A   Behavior During Therapy WFL for tasks assessed/performed           Past Medical History:  Diagnosis Date  . Anxiety   . Arthritis   . Complication of anesthesia   . Family history of adverse reaction to anesthesia    PONV mother  . Hyperlipidemia   . Hypertension   . Melanoma ([place]) 2012  . PONV (postoperative nausea and vomiting) 04/16/2019  . Presence of permanent cardiac pacemaker    Medtronic  . Seizures ([place])     Past Surgical History:  Procedure Laterality Date  . KNEE SURGERY Left   . LEFT HEART CATH AND CORONARY ANGIOGRAPHY Left 06/29/2017   Procedure: LEFT HEART CATH AND CORONARY ANGIOGRAPHY;  Surgeon: [name], MD;  Location: [place] [facility];  Service: Cardiovascular;  Laterality: Left;  . LYMPH NODE DISSECTION Left 04/16/2019   Procedure: Left inguinal Lymph Node Dissection;  Surgeon: [name], MD;  Location: [place];  Service: General;  Laterality: Left;  Marland Kitchen MELANOMA EXCISION Left 04/16/2019   Procedure: MELANOMA EXCISION LEFT GROIN MASS;  Surgeon: [name], MD;  Location: [place];  Service: General;  Laterality: Left;  Marland Kitchen MELANOMA EXCISION WITH SENTINEL LYMPH NODE BIOPSY Left 2012   Left calf   .  PACEMAKER INSERTION N/A 08/26/2018   Procedure: INSERTION PACEMAKER;  Surgeon: [name], MD;  Location: [facility];  Service: Cardiovascular;  Laterality: N/A;  . PORTA CATH INSERTION N/A 08/26/2019   Procedure: PORTA CATH INSERTION;  Surgeon: Katha Cabal, MD;  Location: [facility] [facility];  Service: Cardiovascular;  Laterality: N/A;  . TEMPORARY PACEMAKER N/A 08/25/2018   Procedure: TEMPORARY PACEMAKER;  Surgeon: [name], MD;  Location: [place] [facility];  Service: Cardiovascular;  Laterality: N/A;    There were no vitals filed for this visit.   Subjective Assessment - 12/24/19 1311    Subjective  [name] presents to OT for visit 38/72 to address LLE lymphedema 2/2 melanoma Rx. [name] presents wearing custom compression garment on LLE as perscribed. Replacement garment is on order. Pt reports knee and hip pain. "Sometimes it's an 8/10."    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 w/ LN disection (-7/7 LN); Completed adjuvant XRT 07/07/2019; 08/2018 Pacemaker placed; L knee sx date?;    Limitations chronic LLE knee pain; post surgical LLE pain,  chronic LLE/LLQ swelling, decreased LLE strength, impaired gait, decreased dynamic balance, altered sensation LLE, increased risk of infection, decreased standing and walking tolerance > 1 hr; decreased ankle AROM    Repetition Increases Symptoms    Special Tests +  Stemmer base L toes    Patient Stated Goals reduce swelling to normal and keep it from getting worse    Pain Onset More than a month ago                        OT Treatments/Exercises (OP) - 12/24/19 0001      ADLs   ADL Education Given Yes      Manual Therapy   Manual Therapy Edema management;Manual Lymphatic Drainage (MLD)    Manual Lymphatic Drainage (MLD) MLDto LLE/LLQ in supine and sidelying utilizing short neck sequence, deep abdominals with diaphragmatic breathing,  ipsilateral inguinal -axiillary anastomosis and proximal to distal leg sequence.     Compression Bandaging Max A to don custom thigh length compression stocking using AE                  OT Education - 12/24/19 1647    Education Details Continued skilled Pt/caregiver education  And LE ADL training throughout visit for lymphedema self care/ home program, including compression wrapping, compression garment and device wear/care, lymphatic pumping ther ex, simple self-MLD, and skin care. Discussed progress towards goals.    Person(s) Educated Patient;Spouse    Methods Explanation;Demonstration;Handout    Comprehension Verbalized understanding;Returned demonstration;Need further instruction               OT Long Term Goals - 11/10/19 0957      OT LONG TERM GOAL #1   Title Pt will be able to apply thigh length, multi-layer, short stretch compression wraps daily to single limb using correct gradient techniques with Maximum assistance to achieve optimal limb volume reduction, to return affected limb/s, as closely as possible, to premorbid size and shape, to limit infection risk, and to improve safe functional ambulation and mobility.    Status Achieved   Pt unable to bend left knee to extent needed to appy compression garments independently. [place] is independent with gradient techniques.     OT LONG TERM GOAL #2   Title Pt will be able to verbalize signs and symptoms of cellulitis infection and identify at least 4 common lymphedema precautions using printed resource for reference to limit LE progression over time to limit risk of infection and LE exacerbation.    Status Achieved      OT LONG TERM GOAL #3   Status Achieved      OT LONG TERM GOAL #4   Status Achieved   achieved this date.. Goal met for LLE     OT LONG TERM GOAL #5   Status Achieved      OT LONG TERM GOAL #6   Status Partially Met   At present requires Max A as he cannot reach feet to don stockings                 Plan - 12/24/19 1648    Clinical Impression Statement LLE remains slightly swolen  from ankle to groin despite ongoing CDT and diligent self care; however, limb volume has remained very stable for the last several weeks. Pt tolerated MLD today without increased pain. Skin is well hydrated , and Pt re,mains active exercising at gym with his spouse a few times/ week. Cont as per POC. Fit remade garment ASAP in effort to improve fit and function.    OT Occupational Profile and History Comprehensive Assessment- Review of records and extensive additional review of physical, cognitive, psychosocial history related to current functional performance    Occupational  performance deficits (Please refer to evaluation for details): ADL's;IADL's;Rest and Sleep;Work;Leisure;Social Participation;Other   body image, role performance   Body Structure / Function / Physical Skills ADL;ROM;Scar mobility;IADL;Edema;Balance;Sensation;Skin integrity;Mobility;Flexibility;Strength;Decreased knowledge of precautions;Gait;Pain;Decreased knowledge of use of DME    Rehab Potential Good    Clinical Decision Making Several treatment options, min-mod task modification necessary    Comorbidities Affecting Occupational Performance: Presence of comorbidities impacting occupational performance    Modification or Assistance to Complete Evaluation  Min-Moderate modification of tasks or assist with assess necessary to complete eval    OT Frequency 2x / week    OT Duration 12 weeks   and PRN   OT Treatment/Interventions Self-care/ADL training;Therapeutic exercise;Functional Mobility Training;Manual Therapy;Coping strategies training;Therapeutic activities;Manual lymph drainage;Energy conservation;DME and/or AE instruction;Compression bandaging;Other (comment);Scar mobilization;Patient/family education   fit with appropriate compression garments once swelling is reduced   Plan Complete Decongestive Therapy (CDT), Intensive and  Management Phases to include  manual lymphatyic drainage (MLD), skin care, therapeutic exercise, compression with short stretch wraps and garments, Pt edu for LE sef care    Consulted and Agree with Plan of Care Patient           Patient will benefit from skilled therapeutic intervention in order to improve the following deficits and impairments:   Body Structure / Function / Physical Skills: ADL, ROM, Scar mobility, IADL, Edema, Balance, Sensation, Skin integrity, Mobility, Flexibility, Strength, Decreased knowledge of precautions, Gait, Pain, Decreased knowledge of use of DME       Visit Diagnosis: Lymphedema, not elsewhere classified    Problem List Patient Active Problem List   Diagnosis Date Noted  . Dizziness 12/22/2019  . Arthritis of left knee 11/27/2019  . Vitiligo 09/03/2019  . Port-A-Cath in place 08/20/2019  . Encounter for antineoplastic immunotherapy 08/06/2019  . Malignant melanoma of overlapping sites ([place]) 04/23/2019  . Goals of care, counseling/discussion 04/23/2019  . 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 Near syncope 12/17/2018  . Anemia in chronic kidney disease 12/11/2018  . Benign hypertensive kidney disease with chronic kidney disease 12/11/2018  . Stage 3a chronic kidney disease 12/11/2018  . AKI (acute kidney injury) ([place]) 08/24/2018  . Acute hyperkalemia 08/24/2018  . HTN (hypertension) 08/24/2018  . HLD (hyperlipidemia) 08/24/2018  . Seizures ([place]) 08/24/2018  . Complete heart block ([place]) 08/24/2018  . Arthritis of wrist, left 06/12/2018  . Bradycardia 06/04/2018  . 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 Positive ANA (antinuclear antibody) 03/05/2018  . Swelling of joint of left wrist 03/05/2018  . Coronary artery disease involving native coronary artery of native heart 07/09/2017  . Palpitations 07/09/2017  . Stable angina ([place]) 06/27/2017  . Benign essential HTN 05/25/2017  . LBBB (left bundle  branch block) 05/25/2017    Andrey Spearman, MS, OTR/L, [facility] 12/24/19 4:51 PM  [place] MAIN [facility] SERVICES [address] [place], [place], [address] Phone: [phone]   Fax:  [phone]  Name: Raef Sprigg MRN: [id number] Date of Birth: 06-Oct-1960

## 2019-12-25 ENCOUNTER — Inpatient Hospital Stay (HOSPITAL_BASED_OUTPATIENT_CLINIC_OR_DEPARTMENT_OTHER): Payer: 59 | Admitting: Oncology

## 2019-12-25 ENCOUNTER — Inpatient Hospital Stay: Payer: 59

## 2019-12-25 ENCOUNTER — Other Ambulatory Visit: Payer: Self-pay

## 2019-12-25 ENCOUNTER — Encounter: Payer: Self-pay | Admitting: Oncology

## 2019-12-25 VITALS — BP 132/77 | HR 70 | Temp 97.1°F | Resp 18 | Wt 235.5 lb

## 2019-12-25 DIAGNOSIS — C438 Malignant melanoma of overlapping sites of skin: Secondary | ICD-10-CM

## 2019-12-25 DIAGNOSIS — I890 Lymphedema, not elsewhere classified: Secondary | ICD-10-CM | POA: Diagnosis not present

## 2019-12-25 DIAGNOSIS — L80 Vitiligo: Secondary | ICD-10-CM

## 2019-12-25 DIAGNOSIS — M1712 Unilateral primary osteoarthritis, left knee: Secondary | ICD-10-CM | POA: Diagnosis not present

## 2019-12-25 DIAGNOSIS — Z5112 Encounter for antineoplastic immunotherapy: Secondary | ICD-10-CM

## 2019-12-25 LAB — CBC WITH DIFFERENTIAL/PLATELET
Abs Immature Granulocytes: 0.04 10*3/uL (ref 0.00–0.07)
Basophils Absolute: 0 10*3/uL (ref 0.0–0.1)
Basophils Relative: 1 %
Eosinophils Absolute: 0.3 10*3/uL (ref 0.0–0.5)
Eosinophils Relative: 6 %
HCT: 33.9 % — ABNORMAL LOW (ref 39.0–52.0)
Hemoglobin: 11.8 g/dL — ABNORMAL LOW (ref 13.0–17.0)
Immature Granulocytes: 1 %
Lymphocytes Relative: 21 %
Lymphs Abs: 0.9 10*3/uL (ref 0.7–4.0)
MCH: 30.3 pg (ref 26.0–34.0)
MCHC: 34.8 g/dL (ref 30.0–36.0)
MCV: 87.1 fL (ref 80.0–100.0)
Monocytes Absolute: 0.4 10*3/uL (ref 0.1–1.0)
Monocytes Relative: 10 %
Neutro Abs: 2.5 10*3/uL (ref 1.7–7.7)
Neutrophils Relative %: 61 %
Platelets: 194 10*3/uL (ref 150–400)
RBC: 3.89 MIL/uL — ABNORMAL LOW (ref 4.22–5.81)
RDW: 12.8 % (ref 11.5–15.5)
WBC: 4.1 10*3/uL (ref 4.0–10.5)
nRBC: 0 % (ref 0.0–0.2)

## 2019-12-25 LAB — COMPREHENSIVE METABOLIC PANEL
ALT: 32 U/L (ref 0–44)
AST: 26 U/L (ref 15–41)
Albumin: 4.1 g/dL (ref 3.5–5.0)
Alkaline Phosphatase: 80 U/L (ref 38–126)
Anion gap: 7 (ref 5–15)
BUN: 20 mg/dL (ref 6–20)
CO2: 27 mmol/L (ref 22–32)
Calcium: 9.1 mg/dL (ref 8.9–10.3)
Chloride: 106 mmol/L (ref 98–111)
Creatinine, Ser: 1.22 mg/dL (ref 0.61–1.24)
GFR calc Af Amer: >60 mL/min (ref >60)
GFR calc non Af Amer: >60 mL/min (ref >60)
Glucose, Bld: 109 mg/dL — ABNORMAL HIGH (ref 70–99)
Potassium: 4.4 mmol/L (ref 3.5–5.1)
Sodium: 140 mmol/L (ref 135–145)
Total Bilirubin: 0.9 mg/dL (ref 0.3–1.2)
Total Protein: 7 g/dL (ref 6.5–8.1)

## 2019-12-25 MED ORDER — DICLOFENAC SODIUM 1 % EX GEL: 2.0000 g | Freq: Four times a day (QID) | CUTANEOUS | 0 refills | Status: DC

## 2019-12-25 MED ORDER — SODIUM CHLORIDE 0.9% FLUSH
10.0000 mL | INTRAVENOUS | Status: DC | PRN
  Administered 2019-12-25: 10 mL via INTRAVENOUS
  Filled 2019-12-25: qty 10

## 2019-12-25 MED ORDER — HEPARIN SOD (PORK) LOCK FLUSH 100 UNIT/ML IV SOLN
INTRAVENOUS | Status: AC
  Filled 2019-12-25: qty 5

## 2019-12-25 MED ORDER — HEPARIN SOD (PORK) LOCK FLUSH 100 UNIT/ML IV SOLN
500.0000 [IU] | Freq: Once | INTRAVENOUS | Status: AC
  Administered 2019-12-25: 500 [IU] via INTRAVENOUS
  Filled 2019-12-25: qty 5

## 2019-12-25 MED ORDER — SODIUM CHLORIDE 0.9 % IV SOLN
Freq: Once | INTRAVENOUS | Status: AC
  Filled 2019-12-25: qty 250

## 2019-12-25 MED ORDER — SODIUM CHLORIDE 0.9 % IV SOLN
240.0000 mg | Freq: Once | INTRAVENOUS | Status: AC
  Administered 2019-12-25: 240 mg via INTRAVENOUS
  Filled 2019-12-25: qty 24

## 2019-12-25 NOTE — Progress Notes (Signed)
Patient reports that his chronic knee pain is 7/10 today.

## 2019-12-25 NOTE — Progress Notes (Signed)
Hematology/Oncology follow up  note [facility] [phone] [phone]   Patient Care Team: [name], [name], MD as PCP - General (Student)  REFERRING PROVIDER: Cherylann Parr, MD  CHIEF COMPLAINTS/REASON FOR VISIT:  Follow up for melanoma HISTORY OF PRESENTING ILLNESS:   Edward Pitts is a  59 y.o.  male with PMH listed below was seen in consultation at the request of  [name], [name], MD  for evaluation of inguinal mass Patient presented to emergency room 3 days ago complaining about left ing uinal mass discomfort. Reports that he has really noticed the mass growing for the past 1 months. He has a history of left lower extremity melanoma in 2011, status post local excision.  Pain was increased with squatting of laxation. He was advised to take Tylenol for pain. Denies any fever, chills, night sweating.  He does feel mild nauseated. Appetite is fair.  He has lost about 10 pounds since earlier this year. 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  Left inguinal and left iliac nodes which are suspicious for involvement.  There are also 2 small nonspecific hypodense lesions within the right liver, nonspecific.  # patient underwent left groin mass resection On 04/16/2019. Resection pathology showed malignant melanoma, replacing a lymph node, with extracapsular extension, peripheral and deep margins involved.  Left inguinal contents, all 7 lymph nodes were negative for melanoma in the lymph nodes.  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  # PDL1 80% TPS  # 04/16/2019. underwent left groin mass resection   07/07/2019  Status post adjuvant radiation and finished radiation  Patient has Mediport placed to facilitate immunotherapy treatments. INTERVAL HISTORY Edward Pitts is a 59 y.o. male who has above history  reviewed by me today presents for follow up visit for management of inguinal nodal recurrence of melanoma Problems and complaints are listed below: Patient continues to have left knee pain. No new complaints. Chronic bilateral upper extremity hypopigmentation, unchanged.  No skin rash or diarrhea.  . Review of Systems  Constitutional: Negative for appetite change, chills, fatigue, fever and unexpected weight change.  HENT:   Negative for hearing loss and voice change.   Eyes: Negative for eye problems and icterus.  Respiratory: Negative for chest tightness, cough and shortness of breath.   Cardiovascular: Negative for chest pain and leg swelling.  Gastrointestinal: Negative for abdominal distention and abdominal pain.  Endocrine: Negative for hot flashes.  Genitourinary: Negative for difficulty urinating, dysuria and frequency.   Musculoskeletal: Positive for arthralgias.  Skin: Negative for itching and rash.       Skin hypo-pigmentation on upper extremities, no change  Neurological: Negative for light-headedness and numbness.  Hematological: Negative for adenopathy. Does not bruise/bleed easily.  Psychiatric/Behavioral: Negative for confusion.    MEDICAL HISTORY:  Past Medical History:  Diagnosis Date  . Anxiety   . Arthritis   . Complication of anesthesia   . Family history of adverse reaction to anesthesia    PONV mother  . Hyperlipidemia   . Hypertension   . Melanoma ([place]) 2012  . PONV (postoperative nausea and vomiting) 04/16/2019  . Presence of permanent cardiac pacemaker    Medtronic  . Seizures ([place])     SURGICAL HISTORY: Past Surgical History:  Procedure Laterality Date  . KNEE SURGERY Left   . LEFT HEART CATH AND CORONARY ANGIOGRAPHY Left 06/29/2017  Procedure: LEFT HEART CATH AND CORONARY ANGIOGRAPHY;  Surgeon: [name], MD;  Location: [place] [facility];  Service: Cardiovascular;  Laterality: Left;  . LYMPH NODE DISSECTION Left 04/16/2019    Procedure: Left inguinal Lymph Node Dissection;  Surgeon: [name], MD;  Location: [place];  Service: General;  Laterality: Left;  Marland Kitchen MELANOMA EXCISION Left 04/16/2019   Procedure: MELANOMA EXCISION LEFT GROIN MASS;  Surgeon: [name], MD;  Location: [place];  Service: General;  Laterality: Left;  Marland Kitchen MELANOMA EXCISION WITH SENTINEL LYMPH NODE BIOPSY Left 2012   Left calf   . PACEMAKER INSERTION N/A 08/26/2018   Procedure: INSERTION PACEMAKER;  Surgeon: [name], MD;  Location: [facility];  Service: Cardiovascular;  Laterality: N/A;  . PORTA CATH INSERTION N/A 08/26/2019   Procedure: PORTA CATH INSERTION;  Surgeon: Katha Cabal, MD;  Location: [place] [facility];  Service: Cardiovascular;  Laterality: N/A;  . TEMPORARY PACEMAKER N/A 08/25/2018   Procedure: TEMPORARY PACEMAKER;  Surgeon: [name], MD;  Location: [place] [facility];  Service: Cardiovascular;  Laterality: N/A;    SOCIAL HISTORY: Social History   Socioeconomic History  . Marital status: Married    Spouse name: Vicente Males   . Number of children: 7  . Years of education: 87  . Highest education level: Not on file  Occupational History  . Occupation: [employer]   Tobacco Use  . Smoking status: Never Smoker  . Smokeless tobacco: Never Used  Vaping Use  . Vaping Use: Never used  Substance and Sexual Activity  . Alcohol use: No  . Drug use: No  . Sexual activity: Not on file  Other Topics Concern  . Not on file  Social History Narrative   Lives with mother, wife and sister   Caffeine use: sodas (2 per day)   Social Determinants of Health   Financial Resource Strain: Low Risk   . Difficulty of Paying Living Expenses: Not hard at all  Food Insecurity: No Food Insecurity  . Worried About Charity fundraiser in the Last Year: Never true  . Ran Out of Food in the Last Year: Never true  Transportation Needs: Unmet Transportation Needs  . Lack of Transportation (Medical): Yes  . Lack of Transportation  (Non-Medical): Yes  Physical Activity: Unknown  . Days of Exercise per Week: 0 days  . Minutes of Exercise per Session: Not on file  Stress: No Stress Concern Present  . Feeling of Stress : Only a little  Social Connections: Unknown  . Frequency of Communication with Friends and Family: More than three times a week  . Frequency of Social Gatherings with Friends and Family: Not on file  . Attends Religious Services: Not on file  . Active Member of Clubs or Organizations: Not on file  . Attends Archivist Meetings: Not on file  . Marital Status: Married  Human resources officer Violence: Not At Risk  . Fear of Current or Ex-Partner: No  . Emotionally Abused: No  . Physically Abused: No  . Sexually Abused: No    FAMILY HISTORY: Family History  Problem Relation Age of Onset  . Cancer Paternal Grandmother     ALLERGIES:  is allergic to ibuprofen and nsaids.  MEDICATIONS:  Current Outpatient Medications  Medication Sig Dispense Refill  . acetaminophen (TYLENOL) 650 MG CR tablet Take 650 mg by mouth every 8 (eight) hours as needed for pain.     Marland Kitchen allopurinol (ZYLOPRIM) 300 MG tablet Take 300 mg by  mouth daily.    Marland Kitchen aspirin 81 MG chewable tablet Chew 81 mg by mouth daily.    Marland Kitchen atorvastatin (LIPITOR) 20 MG tablet Take 20 mg by mouth daily.    . clonazePAM (KLONOPIN) 0.5 MG tablet 1 tablet in the morning, 2 in the evening 90 tablet 3  . hydrocortisone 2.5 % cream Apply topically daily as needed.    Marland Kitchen ketoconazole (NIZORAL) 2 % cream Apply 1 application topically daily as needed for irritation.    . lidocaine-prilocaine (EMLA) cream Apply 1 application topically as needed. Apply small amount of cream to port site approx 1-2 hours prior to appointment. 30 g 2  . lisinopril (ZESTRIL) 20 MG tablet Take 20 mg by mouth daily.    . metoprolol succinate (TOPROL-XL) 25 MG 24 hr tablet Take 25 mg by mouth daily.     . Multiple Vitamin (MULTIVITAMIN WITH MINERALS) TABS tablet Take 1 tablet by  mouth daily. Centrum Silver    . silver sulfADIAZINE (SILVADENE) 1 % cream Apply 1 application topically 2 (two) times daily. (Patient taking differently: Apply 1 application topically 2 (two) times daily as needed. ) 50 g 2  . diclofenac Sodium (VOLTAREN) 1 % GEL Apply 2 g topically 4 (four) times daily. 50 g 0   No current facility-administered medications for this visit.     PHYSICAL EXAMINATION: ECOG PERFORMANCE STATUS: 0 - Asymptomatic Vitals:   12/25/19 0935  BP: 132/77  Pulse: 70  Resp: 18  Temp: (!) 97.1 F (36.2 C)   Filed Weights   12/25/19 0935  Weight: 235 lb 8 oz (106.8 kg)    Physical Exam Constitutional:      General: He is not in acute distress. HENT:     Head: Normocephalic and atraumatic.  Eyes:     General: No scleral icterus.    Pupils: Pupils are equal, round, and reactive to light.  Cardiovascular:     Rate and Rhythm: Normal rate and regular rhythm.     Heart sounds: Normal heart sounds.  Pulmonary:     Effort: Pulmonary effort is normal. No respiratory distress.     Breath sounds: No wheezing.  Abdominal:     General: Bowel sounds are normal. There is no distension.     Palpations: Abdomen is soft. There is no mass.     Tenderness: There is no abdominal tenderness.     Comments:  History left groin mass resection and radiation.   Musculoskeletal:        General: No deformity. Normal range of motion.     Cervical back: Normal range of motion and neck supple.     Comments: Left lower extremity edema  Skin:    General: Skin is warm and dry.     Findings: No erythema or rash.  Neurological:     Mental Status: He is alert and oriented to person, place, and time. Mental status is at baseline.     Cranial Nerves: No cranial nerve deficit.     Coordination: Coordination normal.  Psychiatric:        Mood and Affect: Mood normal.    LABORATORY DATA:  I have reviewed the data as listed Lab Results  Component Value Date   WBC 4.1 12/25/2019    HGB 11.8 (L) 12/25/2019   HCT 33.9 (L) 12/25/2019   MCV 87.1 12/25/2019   PLT 194 12/25/2019   Recent Labs    11/27/19 0833 12/11/19 0854 12/25/19 0905  NA 140 139 140  K  4.1 3.8 4.4  CL 106 104 106  CO2 _0 GLUCOSE 125* 118* 109*  BUN 20 25* 20  CREATININE 1.10 1.16 1.22  CALCIUM 9.0 9.1 9.1  GFRNONAA >60 >60 >60  GFRAA >60 >60 >60  PROT 7.1 7.3 7.0  ALBUMIN 4.0 4.2 4.1  AST _1 ALT 40 39 32  ALKPHOS 89 80 80  BILITOT 0.6 0.8 0.9   Iron/TIBC/Ferritin/ %Sat    Component Value Date/Time   IRON 85 10/02/2019 0841   TIBC 342 10/02/2019 0841   FERRITIN 61 10/02/2019 0841   IRONPCTSAT 25 10/02/2019 0841      RADIOGRAPHIC STUDIES: I have personally reviewed the radiological images as listed and agreed with the findings in the report. CT CHEST ABDOMEN PELVIS W CONTRAST  Result Date: 12/10/2019 CLINICAL DATA:  Malignant melanoma. LEFT calf excision 2012. Recurrence to LEFT inguinal lymph node January 21. EXAM: CT CHEST, ABDOMEN, AND PELVIS WITH CONTRAST TECHNIQUE: Multidetector CT imaging of the chest, abdomen and pelvis was performed following the standard protocol during bolus administration of intravenous contrast. CONTRAST:  140m OMNIPAQUE IOHEXOL 300 MG/ML  SOLN COMPARISON:  CT 09/10/2019, PET-CT 02/11/2019 FINDINGS: CT CHEST FINDINGS Cardiovascular: Port in the anterior chest wall with tip in distal SVC. LEFT-sided pacemaker noted. No significant vascular findings. Normal heart size. No pericardial effusion. Mediastinum/Nodes: No axillary or supraclavicular adenopathy. No mediastinal or hilar adenopathy. No pericardial fluid. Esophagus normal. Lungs/Pleura: No suspicious pulmonary nodules. Musculoskeletal: No aggressive osseous lesion. CT ABDOMEN AND PELVIS FINDINGS Hepatobiliary: Hypodense lesion in the RIGHT hepatic lobe measuring 7 mm unchanged. Similar smaller lesion on image 65/2 is also unchanged. Normal gallbladder. Pancreas: Pancreas is normal. No ductal  dilatation. No pancreatic inflammation. Spleen: Normal spleen Adrenals/urinary tract: Adrenal glands and kidneys are normal. The ureters and bladder normal. Stomach/Bowel: Stomach, small-bowel and cecum are normal. The appendix is not identified but there is no pericecal inflammation to suggest appendicitis. The colon and rectosigmoid colon are normal. Vascular/Lymphatic: Abdominal aorta is normal caliber. There is no retroperitoneal or periportal lymphadenopathy. No pelvic lymphadenopathy. 6 mm gastrohepatic ligament lymph node (image 58/2) is unchanged Reproductive: Unremarkable Other: Postsurgical change in LEFT groin consistent prior groin mass resection. No evidence of local recurrence. Musculoskeletal: No aggressive osseous lesion. IMPRESSION: Chest Impression: No evidence of thoracic metastasis Abdomen / Pelvis Impression: 1. Post LEFT groin mass resection.  No evidence local recurrence. 2. No evidence of metastatic disease the abdomen pelvis. 3. No evidence skeletal metastasis. 4. Stable small hypodensities in the RIGHT hepatic lobe. Electronically Signed   By: SSuzy BouchardM.D.   On: 12/10/2019 11:13       ASSESSMENT & PLAN:  1. [place]   2. Encounter for antineoplastic immunotherapy   3. Vitiligo   4. Arthritis of left knee    #Left inguinal nodal recurrence of melanoma.  Status post resection and adjuvant radiation. Labs reviewed and discussed with patient. Counts acceptable to proceed with today's nivolumab treatment.  #Vitiligo, stable #Normocytic anemia, stable. #Left knee arthritis with pain.  Recommend patient to see orthopedic surgeon. Tylenol as needed for pain.  I recommend a trial of Voltaren topical cream. All questions were answered. The patient knows to call the clinic with any problems questions or concerns.  Follow-up with 2 weeks. We spent sufficient time to discuss many aspect of care, questions were answered to patient's  satisfaction.   ZEarlie Server MD, PhD Hematology Oncology [facility]uit[facility]at [facility]  Regional Pager- [phone] 12/25/2019

## 2019-12-31 ENCOUNTER — Ambulatory Visit: Payer: 59 | Attending: Oncology | Admitting: Occupational Therapy

## 2019-12-31 ENCOUNTER — Other Ambulatory Visit: Payer: Self-pay

## 2019-12-31 ENCOUNTER — Ambulatory Visit: Payer: 59 | Admitting: Occupational Therapy

## 2019-12-31 DIAGNOSIS — I890 Lymphedema, not elsewhere classified: Secondary | ICD-10-CM | POA: Insufficient documentation

## 2020-01-01 NOTE — Therapy (Signed)
[place] MAIN [facility] SERVICES [address] [place], [place], [address] Phone: [phone]   Fax:  [phone]  Occupational Therapy Treatment  Patient Details  Name: Edward Pitts MRN: [id number] Date of Birth: 24-Oct-1960 Referring Provider (OT): [name], MD   Encounter Date: 12/31/2019   OT End of Session - 12/31/19 0958    Visit Number 39    Number of Visits 72    Date for OT Re-Evaluation 03/09/20    OT Start Time 0100    OT Stop Time 0205    OT Time Calculation (min) 65 min    Activity Tolerance Patient tolerated treatment well;No increased pain;Other (comment)   decreased hip and knee AROM and joint pain. Pt is unable to reach feet to don lace up shoes, socks and compression garments without Max A   Behavior During Therapy WFL for tasks assessed/performed           Past Medical History:  Diagnosis Date  . Anxiety   . Arthritis   . Complication of anesthesia   . Family history of adverse reaction to anesthesia    PONV mother  . Hyperlipidemia   . Hypertension   . Melanoma ([place]) 2012  . PONV (postoperative nausea and vomiting) 04/16/2019  . Presence of permanent cardiac pacemaker    Medtronic  . Seizures ([place])     Past Surgical History:  Procedure Laterality Date  . KNEE SURGERY Left   . LEFT HEART CATH AND CORONARY ANGIOGRAPHY Left 06/29/2017   Procedure: LEFT HEART CATH AND CORONARY ANGIOGRAPHY;  Surgeon: [name], MD;  Location: [place] [facility];  Service: Cardiovascular;  Laterality: Left;  . LYMPH NODE DISSECTION Left 04/16/2019   Procedure: Left inguinal Lymph Node Dissection;  Surgeon: [name], MD;  Location: [place];  Service: General;  Laterality: Left;  Marland Kitchen MELANOMA EXCISION Left 04/16/2019   Procedure: MELANOMA EXCISION LEFT GROIN MASS;  Surgeon: [name], MD;  Location: [place];  Service: General;  Laterality: Left;  Marland Kitchen MELANOMA EXCISION WITH SENTINEL LYMPH NODE BIOPSY Left 2012   Left calf   .  PACEMAKER INSERTION N/A 08/26/2018   Procedure: INSERTION PACEMAKER;  Surgeon: [name], MD;  Location: [facility];  Service: Cardiovascular;  Laterality: N/A;  . PORTA CATH INSERTION N/A 08/26/2019   Procedure: PORTA CATH INSERTION;  Surgeon: Katha Cabal, MD;  Location: [place] [facility];  Service: Cardiovascular;  Laterality: N/A;  . TEMPORARY PACEMAKER N/A 08/25/2018   Procedure: TEMPORARY PACEMAKER;  Surgeon: [name], MD;  Location: [place] [facility];  Service: Cardiovascular;  Laterality: N/A;    There were no vitals filed for this visit.   Subjective Assessment - 12/31/19 1302    Subjective  Edward Pitts presents to OT for visit 39/72 to address LLE lymphedema 2/2 melanoma Rx. Edward Pitts presents wearing custom compression garment on LLE as perscribed. Replacement garment is on order. Pt reports knee and hip pain. "Sometimes it's an 8/10."    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 w/ LN disection (-7/7 LN); Completed adjuvant XRT 07/07/2019; 08/2018 Pacemaker placed; L knee sx date?;    Limitations chronic LLE knee pain; post surgical LLE pain,  chronic LLE/LLQ swelling, decreased LLE strength, impaired gait, decreased dynamic balance, altered sensation LLE, increased risk of infection, decreased standing and walking tolerance > 1 hr; decreased ankle AROM    Repetition Increases Symptoms    Special Tests +  Stemmer base L toes    Patient Stated Goals reduce swelling to normal and keep it from getting worse    Pain Onset More than a month ago                                     OT Long Term Goals - 11/10/19 0957      OT LONG TERM GOAL #1   Title Pt will be able to apply thigh length, multi-layer, short stretch compression wraps daily to single limb using correct gradient techniques with Maximum assistance to achieve optimal limb volume reduction, to return affected limb/s, as  closely as possible, to premorbid size and shape, to limit infection risk, and to improve safe functional ambulation and mobility.    Status Achieved   Pt unable to bend left knee to extent needed to appy compression garments independently. [place] is independent with gradient techniques.     OT LONG TERM GOAL #2   Title Pt will be able to verbalize signs and symptoms of cellulitis infection and identify at least 4 common lymphedema precautions using printed resource for reference to limit LE progression over time to limit risk of infection and LE exacerbation.    Status Achieved      OT LONG TERM GOAL #3   Status Achieved      OT LONG TERM GOAL #4   Status Achieved   achieved this date.. Goal met for LLE     OT LONG TERM GOAL #5   Status Achieved      OT LONG TERM GOAL #6   Status Partially Met   At present requires Max A as he cannot reach feet to don stockings                Plan - 01/01/20 0959    Clinical Impression Statement Pt tolerated MLD and skin care without increased pain. Max A to don garment after MLD to conserve Rx time. Cont as per POC. Custom garment is ordered. Pt agrees with plan to reduce Rx frequency to f/u after fitting is complete.    OT Occupational Profile and History Comprehensive Assessment- Review of records and extensive additional review of physical, cognitive, psychosocial history related to current functional performance    Occupational performance deficits (Please refer to evaluation for details): ADL's;IADL's;Rest and Sleep;Work;Leisure;Social Participation;Other   body image, role performance   Body Structure / Function / Physical Skills ADL;ROM;Scar mobility;IADL;Edema;Balance;Sensation;Skin integrity;Mobility;Flexibility;Strength;Decreased knowledge of precautions;Gait;Pain;Decreased knowledge of use of DME    Rehab Potential Good    Clinical Decision Making Several treatment options, min-mod task modification necessary    Comorbidities  Affecting Occupational Performance: Presence of comorbidities impacting occupational performance    Modification or Assistance to Complete Evaluation  Min-Moderate modification of tasks or assist with assess necessary to complete eval    OT Frequency 2x / week    OT Duration 12 weeks   and PRN   OT Treatment/Interventions Self-care/ADL training;Therapeutic exercise;Functional Mobility Training;Manual Therapy;Coping strategies training;Therapeutic activities;Manual lymph drainage;Energy conservation;DME and/or AE instruction;Compression bandaging;Other (comment);Scar mobilization;Patient/family education   fit with appropriate compression garments once swelling is reduced   Plan Complete Decongestive Therapy (CDT), Intensive and Management Phases to include  manual lymphatyic drainage (MLD), skin care, therapeutic exercise, compression with short stretch wraps and garments, Pt edu for LE sef care    Consulted and Agree with Plan of Care Patient  Patient will benefit from skilled therapeutic intervention in order to improve the following deficits and impairments:   Body Structure / Function / Physical Skills: ADL, ROM, Scar mobility, IADL, Edema, Balance, Sensation, Skin integrity, Mobility, Flexibility, Strength, Decreased knowledge of precautions, Gait, Pain, Decreased knowledge of use of DME       Visit Diagnosis: Lymphedema, not elsewhere classified    Problem List Patient Active Problem List   Diagnosis Date Noted  . Dizziness 12/22/2019  . Arthritis of left knee 11/27/2019  . Vitiligo 09/03/2019  . Port-A-Cath in place 08/20/2019  . Encounter for antineoplastic immunotherapy 08/06/2019  . Malignant melanoma of overlapping sites ([place]) 04/23/2019  . Goals of care, counseling/discussion 04/23/2019  . 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 Near syncope 12/17/2018  . Anemia in chronic kidney disease 12/11/2018  . Benign hypertensive kidney disease  with chronic kidney disease 12/11/2018  . Stage 3a chronic kidney disease ([place]) 12/11/2018  . AKI (acute kidney injury) ([place]) 08/24/2018  . Acute hyperkalemia 08/24/2018  . HTN (hypertension) 08/24/2018  . HLD (hyperlipidemia) 08/24/2018  . Seizures ([place]) 08/24/2018  . Complete heart block ([place]) 08/24/2018  . Arthritis of wrist, left 06/12/2018  . Bradycardia 06/04/2018  . 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 Positive ANA (antinuclear antibody) 03/05/2018  . Swelling of joint of left wrist 03/05/2018  . Coronary artery disease involving native coronary artery of native heart 07/09/2017  . Palpitations 07/09/2017  . Stable angina ([place]) 06/27/2017  . Benign essential HTN 05/25/2017  . LBBB (left bundle branch block) 05/25/2017   Andrey Spearman, MS, OTR/L, [facility] 01/01/20 10:00 AM   [place] MAIN [facility] SERVICES [place], [place], [address] Phone: [phone]   Fax:  [phone]  Name: Andersen Iorio MRN: [id number] Date of Birth: [birth year]

## 2020-01-07 ENCOUNTER — Ambulatory Visit: Payer: 59 | Admitting: Occupational Therapy

## 2020-01-07 ENCOUNTER — Other Ambulatory Visit: Payer: Self-pay

## 2020-01-07 DIAGNOSIS — I890 Lymphedema, not elsewhere classified: Secondary | ICD-10-CM

## 2020-01-07 NOTE — Therapy (Signed)
[place] MAIN [facility] [address] [place], [place], [address] Phone: [phone]   Fax:  [phone]  Occupational Therapy Treatment  Patient Details  Name: Edward Pitts MRN: [id number] Date of Birth: [birth year] Referring Provider (OT): [name], MD   Encounter Date: 01/07/2020   OT End of Session - 01/07/20 0805    Visit Number 40    Number of Visits 72    Date for OT Re-Evaluation 03/09/20    OT Start Time 0800    OT Stop Time 0850    OT Time Calculation (min) 50 min    Activity Tolerance Patient tolerated treatment well;No increased pain;Other (comment)   decreased hip and knee AROM and joint pain. Pt is unable to reach feet to don lace up shoes, socks and compression garments without Max A   Behavior During Therapy WFL for tasks assessed/performed           Past Medical History:  Diagnosis Date  . Anxiety   . Arthritis   . Complication of anesthesia   . Family history of adverse reaction to anesthesia    PONV mother  . Hyperlipidemia   . Hypertension   . Melanoma ([place]) 2012  . PONV (postoperative nausea and vomiting) 04/16/2019  . Presence of permanent cardiac pacemaker    [employer]  . Seizures ([place])     Past Surgical History:  Procedure Laterality Date  . KNEE SURGERY Left   . LEFT HEART CATH AND CORONARY ANGIOGRAPHY Left 06/29/2017   Procedure: LEFT HEART CATH AND CORONARY ANGIOGRAPHY;  Surgeon: [name], MD;  Location: [place] [facility];  Service: Cardiovascular;  Laterality: Left;  . LYMPH NODE DISSECTION Left 04/16/2019   Procedure: Left inguinal Lymph Node Dissection;  Surgeon: [name], MD;  Location: [place];  Service: General;  Laterality: Left;  Marland Kitchen MELANOMA EXCISION Left 04/16/2019   Procedure: MELANOMA EXCISION LEFT GROIN MASS;  Surgeon: [name], MD;  Location: [place];  Service: General;  Laterality: Left;  Marland Kitchen MELANOMA EXCISION WITH SENTINEL LYMPH NODE BIOPSY Left 2012   Left calf   .  PACEMAKER INSERTION N/A 08/26/2018   Procedure: INSERTION PACEMAKER;  Surgeon: [name], MD;  Location: [facility];  Service: Cardiovascular;  Laterality: N/A;  . PORTA CATH INSERTION N/A 08/26/2019   Procedure: PORTA CATH INSERTION;  Surgeon: Katha Cabal, MD;  Location: [place] [facility];  Service: Cardiovascular;  Laterality: N/A;  . TEMPORARY PACEMAKER N/A 08/25/2018   Procedure: TEMPORARY PACEMAKER;  Surgeon: [name], MD;  Location: [place] [facility];  Service: Cardiovascular;  Laterality: N/A;    There were no vitals filed for this visit.   Subjective Assessment - 01/07/20 0806    Subjective  Edward Pitts presents to OT for visit 40/72 to address LLE lymphedema 2/2 melanoma Rx. Edward Pitts' remade custom compression stocking arrived and will be fitted today. Pt denies LE related pain. He reports knee pain is unchanged.    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 w/ LN disection (-7/7 LN); Completed adjuvant XRT 07/07/2019; 08/2018 Pacemaker placed; L knee sx date?;    Limitations chronic LLE knee pain; post surgical LLE pain,  chronic LLE/LLQ swelling, decreased LLE strength, impaired gait, decreased dynamic balance, altered sensation LLE, increased risk of infection, decreased standing and walking tolerance > 1 hr; decreased ankle AROM    Repetition Increases Symptoms    Special Tests +Stemmer base L  toes    Patient Stated Goals reduce swelling to normal and keep it from getting worse    Pain Onset More than a month ago               LYMPHEDEMA/ONCOLOGY QUESTIONNAIRE - 01/07/20 0001      Left Lower Extremity Lymphedema   Other LLE A-D volume =3766.2 ml. This value reveals a 6.1% limb volume INCREASe from ankle to below knee since last measured on 11/05/19. RLE E-G volume = 7961.2 ml. This value reveals a 15 % volume INCREASE in the L thigh since 8/11.  LLE A-G (full limb) volume = 11727.4 ml. This  value reveals full limb volume INCREASE of 12.1 % from ankle to groin since 8/21.                   OT Treatments/Exercises (OP) - 01/07/20 0001      ADLs   ADL Education Given Yes      Manual Therapy   Manual Therapy Edema management;Manual Lymphatic Drainage (MLD)    Manual therapy comments LLE comparative limb volumetrics    Edema Management fitting custom compression thigh high- REMAKE    Compression Bandaging Max A to don custom thigh length compression stocking using AE                  OT Education - 01/07/20 0902    Education Details Continued skilled Pt/caregiver education  And LE ADL training throughout visit for lymphedema self care/ home program, including compression wrapping, compression garment and device wear/care, lymphatic pumping ther ex, simple self-MLD, and skin care. Discussed progress towards goals.    Person(s) Educated Patient;Spouse    Methods Explanation;Demonstration;Handout    Comprehension Verbalized understanding;Returned demonstration;Need further instruction               OT Long Term Goals - 01/07/20 1218      OT LONG TERM GOAL #1   Title Pt will be able to apply thigh length, multi-layer, short stretch compression wraps daily to single limb using correct gradient techniques with Maximum assistance to achieve optimal limb volume reduction, to return affected limb/s, as closely as possible, to premorbid size and shape, to limit infection risk, and to improve safe functional ambulation and mobility.    Status Achieved   Pt unable to bend left knee to extent needed to appy compression garments independently. [place] is independent with gradient techniques.     OT LONG TERM GOAL #2   Title Pt will be able to verbalize signs and symptoms of cellulitis infection and identify at least 4 common lymphedema precautions using printed resource for reference to limit LE progression over time to limit risk of infection and LE exacerbation.     Status Achieved      OT LONG TERM GOAL #3   Status Achieved      OT LONG TERM GOAL #4   Status Achieved   achieved this date.. Goal met for LLE     OT LONG TERM GOAL #5   Status Achieved      OT LONG TERM GOAL #6   Status Not Met   Pt mod A with donning and doffing with spouse's assistance                Plan - 01/07/20 0903    Clinical Impression Statement Completed LLE comparative limb volumetrics for progress report on this last day of LLE Intensive Phase CDT. LLE A-D volume =3766.2 ml. This value  reveals a 6.1% limb volume INCREASe from ankle to below knee since last measured on 11/05/19. RLE E-G volume = 7961.2 ml. This value reveals a 15 % volume INCREASE in the L thigh since 8/11.  LLE A-G (full limb) volume = 11727.4 ml. This value reveals full limb volume INCREASE of 12.1 % from ankle to groin since 8/21. Leg does not appear more swollen visually and pitting is absent. It's possible that now that Pt has become more active , going to the gym 3 x weekly, muscle mass is building in the limb increasing volumes. Pt reports 9# weight gain over the past few months, which may also contribute to increased limb volume. Remade custon compression thigh high appears to fit and function well in clinicaat initial fitting today. Pt encouraged to wear daily , especially when working out and excercising, and we'll check final fit next week. Today marks the end of Intensive Phase CDT. Pt transitions today to Self -Management Phase with follow along support from OT PRN. See Long term goals for detailed progress to date.    OT Occupational Profile and History Comprehensive Assessment- Review of records and extensive additional review of physical, cognitive, psychosocial history related to current functional performance    Occupational performance deficits (Please refer to evaluation for details): ADL's;IADL's;Rest and Sleep;Work;Leisure;Social Participation;Other   body image, role performance    Body Structure / Function / Physical Skills ADL;ROM;Scar mobility;IADL;Edema;Balance;Sensation;Skin integrity;Mobility;Flexibility;Strength;Decreased knowledge of precautions;Gait;Pain;Decreased knowledge of use of DME    Rehab Potential Good    Clinical Decision Making Several treatment options, min-mod task modification necessary    Comorbidities Affecting Occupational Performance: Presence of comorbidities impacting occupational performance    Modification or Assistance to Complete Evaluation  Min-Moderate modification of tasks or assist with assess necessary to complete eval    OT Frequency 2x / week    OT Duration 12 weeks   and PRN   OT Treatment/Interventions Self-care/ADL training;Therapeutic exercise;Functional Mobility Training;Manual Therapy;Coping strategies training;Therapeutic activities;Manual lymph drainage;Energy conservation;DME and/or AE instruction;Compression bandaging;Other (comment);Scar mobilization;Patient/family education   fit with appropriate compression garments once swelling is reduced   Plan Complete Decongestive Therapy (CDT), Intensive and Management Phases to include  manual lymphatyic drainage (MLD), skin care, therapeutic exercise, compression with short stretch wraps and garments, Pt edu for LE sef care    Consulted and Agree with Plan of Care Patient           Patient will benefit from skilled therapeutic intervention in order to improve the following deficits and impairments:   Body Structure / Function / Physical Skills: ADL, ROM, Scar mobility, IADL, Edema, Balance, Sensation, Skin integrity, Mobility, Flexibility, Strength, Decreased knowledge of precautions, Gait, Pain, Decreased knowledge of use of DME       Visit Diagnosis: Lymphedema, not elsewhere classified    Problem List Patient Active Problem List   Diagnosis Date Noted  . Dizziness 12/22/2019  . Arthritis of left knee 11/27/2019  . Vitiligo 09/03/2019  . Port-A-Cath in place  08/20/2019  . Encounter for antineoplastic immunotherapy 08/06/2019  . Malignant melanoma of overlapping sites (HCC) 04/23/2019  . Goals of care, counseling/discussion 04/23/2019  . 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 Near syncope 12/17/2018  . Anemia in chronic kidney disease 12/11/2018  . Benign hypertensive kidney disease with chronic kidney disease 12/11/2018  . Stage 3a chronic kidney disease (HCC) 12/11/2018  . AKI (acute kidney injury) (HCC) 08/24/2018  . Acute hyperkalemia 08/24/2018  . HTN (hypertension) 08/24/2018  .  HLD (hyperlipidemia) 08/24/2018  . Seizures ([place]) 08/24/2018  . Complete heart block ([place]) 08/24/2018  . Arthritis of wrist, left 06/12/2018  . Bradycardia 06/04/2018  . 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 Positive ANA (antinuclear antibody) 03/05/2018  . Swelling of joint of left wrist 03/05/2018  . Coronary artery disease involving native coronary artery of native heart 07/09/2017  . Palpitations 07/09/2017  . Stable angina ([place]) 06/27/2017  . Benign essential HTN 05/25/2017  . LBBB (left bundle branch block) 05/25/2017   Edward Spearman, Edward Pitts, Edward Pitts, Edward Pitts 01/07/20 12:20 PM   Edward Pitts, Edward Pitts [address] [place], [place], [address] Phone: [phone]   Fax:  [phone]  Name: Edward Pitts MRN: [id number] Date of Birth: June 04, 1960

## 2020-01-08 ENCOUNTER — Other Ambulatory Visit: Payer: Self-pay

## 2020-01-08 ENCOUNTER — Inpatient Hospital Stay: Payer: 59

## 2020-01-08 ENCOUNTER — Inpatient Hospital Stay (HOSPITAL_BASED_OUTPATIENT_CLINIC_OR_DEPARTMENT_OTHER): Payer: 59 | Admitting: Oncology

## 2020-01-08 ENCOUNTER — Encounter: Payer: Self-pay | Admitting: Oncology

## 2020-01-08 ENCOUNTER — Inpatient Hospital Stay: Payer: 59 | Attending: Oncology

## 2020-01-08 VITALS — BP 130/63 | HR 70 | Temp 97.6°F | Resp 18 | Wt 237.0 lb

## 2020-01-08 DIAGNOSIS — L80 Vitiligo: Secondary | ICD-10-CM

## 2020-01-08 DIAGNOSIS — Z79899 Other long term (current) drug therapy: Secondary | ICD-10-CM | POA: Diagnosis not present

## 2020-01-08 DIAGNOSIS — D649 Anemia, unspecified: Secondary | ICD-10-CM | POA: Insufficient documentation

## 2020-01-08 DIAGNOSIS — Z5112 Encounter for antineoplastic immunotherapy: Secondary | ICD-10-CM

## 2020-01-08 DIAGNOSIS — C4359 Malignant melanoma of other part of trunk: Secondary | ICD-10-CM | POA: Insufficient documentation

## 2020-01-08 DIAGNOSIS — C438 Malignant melanoma of overlapping sites of skin: Secondary | ICD-10-CM

## 2020-01-08 DIAGNOSIS — M25552 Pain in left hip: Secondary | ICD-10-CM

## 2020-01-08 DIAGNOSIS — M1712 Unilateral primary osteoarthritis, left knee: Secondary | ICD-10-CM | POA: Insufficient documentation

## 2020-01-08 LAB — CBC WITH DIFFERENTIAL/PLATELET
Abs Immature Granulocytes: 0.03 10*3/uL (ref 0.00–0.07)
Basophils Absolute: 0 10*3/uL (ref 0.0–0.1)
Basophils Relative: 0 %
Eosinophils Absolute: 0.2 10*3/uL (ref 0.0–0.5)
Eosinophils Relative: 7 %
HCT: 34.1 % — ABNORMAL LOW (ref 39.0–52.0)
Hemoglobin: 11.7 g/dL — ABNORMAL LOW (ref 13.0–17.0)
Immature Granulocytes: 1 %
Lymphocytes Relative: 19 %
Lymphs Abs: 0.7 10*3/uL (ref 0.7–4.0)
MCH: 30.2 pg (ref 26.0–34.0)
MCHC: 34.3 g/dL (ref 30.0–36.0)
MCV: 88.1 fL (ref 80.0–100.0)
Monocytes Absolute: 0.4 10*3/uL (ref 0.1–1.0)
Monocytes Relative: 11 %
Neutro Abs: 2.3 10*3/uL (ref 1.7–7.7)
Neutrophils Relative %: 62 %
Platelets: 172 10*3/uL (ref 150–400)
RBC: 3.87 MIL/uL — ABNORMAL LOW (ref 4.22–5.81)
RDW: 13.2 % (ref 11.5–15.5)
WBC: 3.7 10*3/uL — ABNORMAL LOW (ref 4.0–10.5)
nRBC: 0 % (ref 0.0–0.2)

## 2020-01-08 LAB — COMPREHENSIVE METABOLIC PANEL
ALT: 36 U/L (ref 0–44)
AST: 31 U/L (ref 15–41)
Albumin: 4.2 g/dL (ref 3.5–5.0)
Alkaline Phosphatase: 75 U/L (ref 38–126)
Anion gap: 9 (ref 5–15)
BUN: 24 mg/dL — ABNORMAL HIGH (ref 6–20)
CO2: 25 mmol/L (ref 22–32)
Calcium: 8.9 mg/dL (ref 8.9–10.3)
Chloride: 105 mmol/L (ref 98–111)
Creatinine, Ser: 1.11 mg/dL (ref 0.61–1.24)
GFR, Estimated: >60 mL/min (ref >60)
Glucose, Bld: 140 mg/dL — ABNORMAL HIGH (ref 70–99)
Potassium: 3.9 mmol/L (ref 3.5–5.1)
Sodium: 139 mmol/L (ref 135–145)
Total Bilirubin: 0.8 mg/dL (ref 0.3–1.2)
Total Protein: 7.1 g/dL (ref 6.5–8.1)

## 2020-01-08 LAB — LACTATE DEHYDROGENASE: LDH: 111 U/L (ref 98–192)

## 2020-01-08 MED ORDER — HEPARIN SOD (PORK) LOCK FLUSH 100 UNIT/ML IV SOLN
500.0000 [IU] | Freq: Once | INTRAVENOUS | Status: AC | PRN
  Administered 2020-01-08: 500 [IU]
  Filled 2020-01-08: qty 5

## 2020-01-08 MED ORDER — SODIUM CHLORIDE 0.9 % IV SOLN
240.0000 mg | Freq: Once | INTRAVENOUS | Status: AC
  Administered 2020-01-08: 240 mg via INTRAVENOUS
  Filled 2020-01-08: qty 24

## 2020-01-08 MED ORDER — SODIUM CHLORIDE 0.9 % IV SOLN
Freq: Once | INTRAVENOUS | Status: AC
  Filled 2020-01-08: qty 250

## 2020-01-08 MED ORDER — HEPARIN SOD (PORK) LOCK FLUSH 100 UNIT/ML IV SOLN
INTRAVENOUS | Status: AC
  Filled 2020-01-08: qty 5

## 2020-01-08 NOTE — Progress Notes (Signed)
Pt here for follow up. Pt reports that he has occasional joint stiffness to left hip. He is unsure if compression stocking may be causing this, possibly pushing fluid up to his hip area.

## 2020-01-08 NOTE — Progress Notes (Signed)
Hematology/Oncology follow up  note [facility] [phone] [phone]   Patient Care Team: [name], Barnabas Lister, MD as PCP - General (Student)  REFERRING PROVIDER: Antonieta Loveless, MD  CHIEF COMPLAINTS/REASON FOR VISIT:  Follow up for melanoma HISTORY OF PRESENTING ILLNESS:   Edward Pitts is a  59 y.o.  male with PMH listed below was seen in consultation at the request of  [name], Barnabas Lister, MD  for evaluation of inguinal mass Patient presented to emergency room 3 days ago complaining about left ing uinal mass discomfort. Reports that he has really noticed the mass growing for the past 1 months. He has a history of left lower extremity melanoma in 2011, status post local excision.  Pain was increased with squatting of laxation. He was advised to take Tylenol for pain. Denies any fever, chills, night sweating.  He does feel mild nauseated. Appetite is fair.  He has lost about 10 pounds since earlier this year. 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  Left inguinal and left iliac nodes which are suspicious for involvement.  There are also 2 small nonspecific hypodense lesions within the right liver, nonspecific.  # patient underwent left groin mass resection On 04/16/2019. Resection pathology showed malignant melanoma, replacing a lymph node, with extracapsular extension, peripheral and deep margins involved.  Left inguinal contents, all 7 lymph nodes were negative for melanoma in the lymph nodes.  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  # PDL1 80% TPS  # 04/16/2019. underwent left groin mass resection   07/07/2019  Status post adjuvant radiation and finished radiation  Patient has Mediport placed to facilitate immunotherapy treatments. INTERVAL HISTORY Edward Pitts is a 59 y.o. male who has above history  reviewed by me today presents for follow up visit for management of inguinal nodal recurrence of melanoma Problems and complaints are listed below: Patient continues to have left knee pain. Today also complains left hip pain.  Otherwise doing well.  Chronic bilateral upper extremity hypopigmentation, unchanged.  No skin rash or diarrhea.  . Review of Systems  Constitutional: Negative for appetite change, chills, fatigue, fever and unexpected weight change.  HENT:   Negative for hearing loss and voice change.   Eyes: Negative for eye problems and icterus.  Respiratory: Negative for chest tightness, cough and shortness of breath.   Cardiovascular: Negative for chest pain and leg swelling.  Gastrointestinal: Negative for abdominal distention and abdominal pain.  Endocrine: Negative for hot flashes.  Genitourinary: Negative for difficulty urinating, dysuria and frequency.   Musculoskeletal: Positive for arthralgias.  Skin: Negative for itching and rash.       Skin hypo-pigmentation on upper extremities, no change  Neurological: Negative for light-headedness and numbness.  Hematological: Negative for adenopathy. Does not bruise/bleed easily.  Psychiatric/Behavioral: Negative for confusion.    MEDICAL HISTORY:  Past Medical History:  Diagnosis Date  . Anxiety   . Arthritis   . Complication of anesthesia   . Family history of adverse reaction to anesthesia    PONV mother  . Hyperlipidemia   . Hypertension   . Melanoma (HCC) 2012  . PONV (postoperative nausea and vomiting) 04/16/2019  . Presence of permanent cardiac pacemaker    [employer]  . Seizures (HCC)     SURGICAL HISTORY: Past Surgical History:  Procedure Laterality Date  . KNEE SURGERY Left   . LEFT  HEART CATH AND CORONARY ANGIOGRAPHY Left 06/29/2017   Procedure: LEFT HEART CATH AND CORONARY ANGIOGRAPHY;  Surgeon: [name], MD;  Location: [place] [facility];  Service: Cardiovascular;  Laterality: Left;  . LYMPH  NODE DISSECTION Left 04/16/2019   Procedure: Left inguinal Lymph Node Dissection;  Surgeon: [name], MD;  Location: [place];  Service: General;  Laterality: Left;  Marland Kitchen MELANOMA EXCISION Left 04/16/2019   Procedure: MELANOMA EXCISION LEFT GROIN MASS;  Surgeon: [name], MD;  Location: [place];  Service: General;  Laterality: Left;  Marland Kitchen MELANOMA EXCISION WITH SENTINEL LYMPH NODE BIOPSY Left 2012   Left calf   . PACEMAKER INSERTION N/A 08/26/2018   Procedure: INSERTION PACEMAKER;  Surgeon: [name], MD;  Location: [facility];  Service: Cardiovascular;  Laterality: N/A;  . PORTA CATH INSERTION N/A 08/26/2019   Procedure: PORTA CATH INSERTION;  Surgeon: Katha Cabal, MD;  Location: [place] [facility];  Service: Cardiovascular;  Laterality: N/A;  . TEMPORARY PACEMAKER N/A 08/25/2018   Procedure: TEMPORARY PACEMAKER;  Surgeon: [name], MD;  Location: [place] [facility];  Service: Cardiovascular;  Laterality: N/A;    SOCIAL HISTORY: Social History   Socioeconomic History  . Marital status: Married    Spouse name: Vicente Males   . Number of children: 7  . Years of education: 12  . Highest education level: Not on file  Occupational History  . Occupation: [employer]   Tobacco Use  . Smoking status: Never Smoker  . Smokeless tobacco: Never Used  Vaping Use  . Vaping Use: Never used  Substance and Sexual Activity  . Alcohol use: No  . Drug use: No  . Sexual activity: Not on file  Other Topics Concern  . Not on file  Social History Narrative   Lives with mother, wife and sister   Caffeine use: sodas (2 per day)   Social Determinants of Health   Financial Resource Strain: Low Risk   . Difficulty of Paying Living Expenses: Not hard at all  Food Insecurity: No Food Insecurity  . Worried About Charity fundraiser in the Last Year: Never true  . Ran Out of Food in the Last Year: Never true  Transportation Needs: Unmet Transportation Needs  . Lack of Transportation (Medical):  Yes  . Lack of Transportation (Non-Medical): Yes  Physical Activity: Unknown  . Days of Exercise per Week: 0 days  . Minutes of Exercise per Session: Not on file  Stress: No Stress Concern Present  . Feeling of Stress : Only a little  Social Connections: Unknown  . Frequency of Communication with Friends and Family: More than three times a week  . Frequency of Social Gatherings with Friends and Family: Not on file  . Attends Religious Services: Not on file  . Active Member of Clubs or Organizations: Not on file  . Attends Archivist Meetings: Not on file  . Marital Status: Married  Human resources officer Violence: Not At Risk  . Fear of Current or Ex-Partner: No  . Emotionally Abused: No  . Physically Abused: No  . Sexually Abused: No    FAMILY HISTORY: Family History  Problem Relation Age of Onset  . Cancer Paternal Grandmother     ALLERGIES:  is allergic to ibuprofen and nsaids.  MEDICATIONS:  Current Outpatient Medications  Medication Sig Dispense Refill  . acetaminophen (TYLENOL) 650 MG CR tablet Take 650 mg by mouth every 8 (eight) hours as needed for pain.     Marland Kitchen  allopurinol (ZYLOPRIM) 300 MG tablet Take 300 mg by mouth daily.    Marland Kitchen aspirin 81 MG chewable tablet Chew 81 mg by mouth daily.    Marland Kitchen atorvastatin (LIPITOR) 20 MG tablet Take 20 mg by mouth daily.    . clonazePAM (KLONOPIN) 0.5 MG tablet 1 tablet in the morning, 2 in the evening 90 tablet 3  . diclofenac Sodium (VOLTAREN) 1 % GEL Apply 2 g topically 4 (four) times daily. 50 g 0  . hydrocortisone 2.5 % cream Apply topically daily as needed.    Marland Kitchen ketoconazole (NIZORAL) 2 % cream Apply 1 application topically daily as needed for irritation.    . lidocaine-prilocaine (EMLA) cream Apply 1 application topically as needed. Apply small amount of cream to port site approx 1-2 hours prior to appointment. 30 g 2  . lisinopril (ZESTRIL) 20 MG tablet Take 20 mg by mouth daily.    . metoprolol succinate (TOPROL-XL) 25 MG  24 hr tablet Take 25 mg by mouth daily.     . Multiple Vitamin (MULTIVITAMIN WITH MINERALS) TABS tablet Take 1 tablet by mouth daily. Centrum Silver    . silver sulfADIAZINE (SILVADENE) 1 % cream Apply 1 application topically 2 (two) times daily. (Patient not taking: Reported on 01/08/2020) 50 g 2   No current facility-administered medications for this visit.     PHYSICAL EXAMINATION: ECOG PERFORMANCE STATUS: 1 - Symptomatic but completely ambulatory Vitals:   01/08/20 0910  BP: 130/63  Pulse: 70  Resp: 18  Temp: 97.6 F (36.4 C)   Filed Weights   01/08/20 0910  Weight: 237 lb (107.5 kg)    Physical Exam Constitutional:      General: He is not in acute distress. HENT:     Head: Normocephalic and atraumatic.  Eyes:     General: No scleral icterus.    Pupils: Pupils are equal, round, and reactive to light.  Cardiovascular:     Rate and Rhythm: Normal rate and regular rhythm.     Heart sounds: Normal heart sounds.  Pulmonary:     Effort: Pulmonary effort is normal. No respiratory distress.     Breath sounds: No wheezing.  Abdominal:     General: Bowel sounds are normal. There is no distension.     Palpations: Abdomen is soft. There is no mass.     Tenderness: There is no abdominal tenderness.     Comments:  History left groin mass resection and radiation.   Musculoskeletal:        General: No deformity. Normal range of motion.     Cervical back: Normal range of motion and neck supple.     Comments: Left lower extremity edema  Skin:    General: Skin is warm and dry.     Findings: No erythema or rash.  Neurological:     Mental Status: He is alert and oriented to person, place, and time. Mental status is at baseline.     Cranial Nerves: No cranial nerve deficit.     Coordination: Coordination normal.  Psychiatric:        Mood and Affect: Mood normal.    LABORATORY DATA:  I have reviewed the data as listed Lab Results  Component Value Date   WBC 3.7 (L)  01/08/2020   HGB 11.7 (L) 01/08/2020   HCT 34.1 (L) 01/08/2020   MCV 88.1 01/08/2020   PLT 172 01/08/2020   Recent Labs    11/27/19 8657 11/27/19 8469 12/11/19 6295 12/25/19 0905 01/08/20 2841  NA 140   < > 139 140 139  K 4.1   < > 3.8 4.4 3.9  CL 106   < > 104 106 105  CO2 24   < > $R'27 27 25  'kS$ GLUCOSE 125*   < > 118* 109* 140*  BUN 20   < > 25* 20 24*  CREATININE 1.10   < > 1.16 1.22 1.11  CALCIUM 9.0   < > 9.1 9.1 8.9  GFRNONAA >60   < > >60 >60 >60  GFRAA >60  --  >60 >60  --   PROT 7.1   < > 7.3 7.0 7.1  ALBUMIN 4.0   < > 4.2 4.1 4.2  AST 29   < > $R'28 26 31  'MW$ ALT 40   < > 39 32 36  ALKPHOS 89   < > 80 80 75  BILITOT 0.6   < > 0.8 0.9 0.8   < > = values in this interval not displayed.   Iron/TIBC/Ferritin/ %Sat    Component Value Date/Time   IRON 85 10/02/2019 0841   TIBC 342 10/02/2019 0841   FERRITIN 61 10/02/2019 0841   IRONPCTSAT 25 10/02/2019 0841      RADIOGRAPHIC STUDIES: I have personally reviewed the radiological images as listed and agreed with the findings in the report. CT CHEST ABDOMEN PELVIS W CONTRAST  Result Date: 12/10/2019 CLINICAL DATA:  Malignant melanoma. LEFT calf excision 2012. Recurrence to LEFT inguinal lymph node January 21. EXAM: CT CHEST, ABDOMEN, AND PELVIS WITH CONTRAST TECHNIQUE: Multidetector CT imaging of the chest, abdomen and pelvis was performed following the standard protocol during bolus administration of intravenous contrast. CONTRAST:  149mL OMNIPAQUE IOHEXOL 300 MG/ML  SOLN COMPARISON:  CT 09/10/2019, PET-CT 02/11/2019 FINDINGS: CT CHEST FINDINGS Cardiovascular: Port in the anterior chest wall with tip in distal SVC. LEFT-sided pacemaker noted. No significant vascular findings. Normal heart size. No pericardial effusion. Mediastinum/Nodes: No axillary or supraclavicular adenopathy. No mediastinal or hilar adenopathy. No pericardial fluid. Esophagus normal. Lungs/Pleura: No suspicious pulmonary nodules. Musculoskeletal: No aggressive  osseous lesion. CT ABDOMEN AND PELVIS FINDINGS Hepatobiliary: Hypodense lesion in the RIGHT hepatic lobe measuring 7 mm unchanged. Similar smaller lesion on image 65/2 is also unchanged. Normal gallbladder. Pancreas: Pancreas is normal. No ductal dilatation. No pancreatic inflammation. Spleen: Normal spleen Adrenals/urinary tract: Adrenal glands and kidneys are normal. The ureters and bladder normal. Stomach/Bowel: Stomach, small-bowel and cecum are normal. The appendix is not identified but there is no pericecal inflammation to suggest appendicitis. The colon and rectosigmoid colon are normal. Vascular/Lymphatic: Abdominal aorta is normal caliber. There is no retroperitoneal or periportal lymphadenopathy. No pelvic lymphadenopathy. 6 mm gastrohepatic ligament lymph node (image 58/2) is unchanged Reproductive: Unremarkable Other: Postsurgical change in LEFT groin consistent prior groin mass resection. No evidence of local recurrence. Musculoskeletal: No aggressive osseous lesion. IMPRESSION: Chest Impression: No evidence of thoracic metastasis Abdomen / Pelvis Impression: 1. Post LEFT groin mass resection.  No evidence local recurrence. 2. No evidence of metastatic disease the abdomen pelvis. 3. No evidence skeletal metastasis. 4. Stable small hypodensities in the RIGHT hepatic lobe. Electronically Signed   By: Suzy Bouchard M.D.   On: 12/10/2019 11:13       ASSESSMENT & PLAN:  1. Malignant melanoma of overlapping sites ([place])   2. Left hip pain   3. Vitiligo   4. Encounter for antineoplastic immunotherapy    #Left inguinal nodal recurrence of melanoma.  Status post resection and adjuvant radiation. Labs are  reviewed and discussed with patient. Counts are acceptable to proceed with today's nivolumab treatment.  Repeat CT in December.   # Left hip pain, check CXR. I suspect this is related to LLE lymphedema. Recommend left hip xray.  Recommend him to re-establish care with orthopedic surgeon for  further evaluation.   #Vitiligo, stable #Normocytic anemia, stable. #Left knee arthritis with pain.   Tylenol as needed for pain.  I recommend a trial of Voltaren topical cream. All questions were answered. The patient knows to call the clinic with any problems questions or concerns.  Follow-up with 2 weeks. We spent sufficient time to discuss many aspect of care, questions were answered to patient's satisfaction.   [name], MD, PhD Hematology Oncology [facility] Pager- [phone] 01/08/2020

## 2020-01-12 ENCOUNTER — Ambulatory Visit
Admission: RE | Admit: 2020-01-12 | Discharge: 2020-01-12 | Disposition: A | Discharge status: Home / Self Care | Payer: 59 | Source: Ambulatory Visit | Attending: Radiation Oncology | Admitting: Radiation Oncology

## 2020-01-12 ENCOUNTER — Encounter: Payer: Self-pay | Admitting: Radiation Oncology

## 2020-01-12 ENCOUNTER — Other Ambulatory Visit: Payer: Self-pay

## 2020-01-12 VITALS — BP 144/73 | HR 84 | Temp 96.9°F | Resp 16 | Wt 237.6 lb

## 2020-01-12 DIAGNOSIS — Z85820 Personal history of malignant melanoma of skin: Secondary | ICD-10-CM | POA: Diagnosis not present

## 2020-01-12 DIAGNOSIS — C779 Secondary and unspecified malignant neoplasm of lymph node, unspecified: Secondary | ICD-10-CM

## 2020-01-12 DIAGNOSIS — C439 Malignant melanoma of skin, unspecified: Secondary | ICD-10-CM

## 2020-01-12 NOTE — Progress Notes (Signed)
Radiation Oncology Follow up Note  Name: Edward Pitts   Date:   01/12/2020 MRN:  [id number] DOB: May 30, 1960    This 59 y.o. male presents to the clinic today for 23-month follow-up status post radiation therapy to his left groin and patient with known resected large area of metastatic involvement of malignant melanoma.  REFERRING PROVIDER: [name], [name], MD  HPI: Patient is a 59 year old male now out 6 months having completed radiation therapy to his left groin and patient with large confluent mass of metastatic malignant melanoma..  He developed left inguinal recurrence which we radiated.  He is seen today in routine follow-up and is doing well.  Still has some left hip pain still has some lymphedema in his left lower extremity.  He had a CT scan back in September which I have reviewed shows status post left groin mass resection no evidence of local recurrence no evidence of metastatic disease in the abdomen or pelvis and no skeletal metastasis.  He is currently on nivolumab treatment.  Which she is tolerating well.  COMPLICATIONS OF TREATMENT: none  FOLLOW UP COMPLIANCE: keeps appointments   PHYSICAL EXAM:  BP (!) 144/73 (BP Location: Left Arm, Patient Position: Sitting)   Pulse 84   Temp (!) 96.9 F (36.1 C) (Tympanic)   Resp 16   Wt 237 lb 9.6 oz (107.8 kg)   BMI 37.21 kg/m  No evidence of mass or nodularity is noted in either areas of the groin.  Continues to have some lymphedema in his left lower extremity.  Well-developed well-nourished patient in NAD. HEENT reveals PERLA, EOMI, discs not visualized.  Oral cavity is clear. No oral mucosal lesions are identified. Neck is clear without evidence of cervical or supraclavicular adenopathy. Lungs are clear to A&P. Cardiac examination is essentially unremarkable with regular rate and rhythm without murmur rub or thrill. Abdomen is benign with no organomegaly or masses noted. Motor sensory and DTR levels are equal and symmetric in the upper  and lower extremities. Cranial nerves II through XII are grossly intact. Proprioception is intact. No peripheral adenopathy or edema is identified. No motor or sensory levels are noted. Crude visual fields are within normal range.  RADIOLOGY RESULTS: CT scans reviewed compatible with above-stated findings  PLAN: Present time patient is doing well no evidence of disease by CT criteria.  I am going to turn follow-up care over to Dr. Tasia Catchings and medical oncology for continuation of immunotherapy.  Would be happy to reevaluate the patient anytime should further treatment be indicated.  Patient knows to call with any concerns.  I would like to take this opportunity to thank you for allowing me to participate in the care of your patient.Noreene Filbert, MD

## 2020-01-13 ENCOUNTER — Ambulatory Visit: Payer: 59 | Admitting: Occupational Therapy

## 2020-01-13 DIAGNOSIS — I890 Lymphedema, not elsewhere classified: Secondary | ICD-10-CM | POA: Diagnosis not present

## 2020-01-13 NOTE — Therapy (Signed)
[place] MAIN [facility] SERVICES [address] [place], [place], [address] Phone: [phone]   Fax:  [phone]  Occupational Therapy Treatment  Patient Details  Name: Edward Pitts MRN: [id number] Date of Birth: 1961/01/18 Referring Provider (OT): [name], MD   Encounter Date: 01/13/2020   OT End of Session - 01/13/20 1658    Visit Number 41    Number of Visits 72    Date for OT Re-Evaluation 03/09/20    OT Start Time 1112    OT Stop Time 1212    OT Time Calculation (min) 60 min    Activity Tolerance Patient tolerated treatment well;No increased pain   decreased hip and knee AROM and joint pain. Pt is unable to reach feet to don lace up shoes, socks and compression garments without Max A   Behavior During Therapy WFL for tasks assessed/performed           Past Medical History:  Diagnosis Date  . Anxiety   . Arthritis   . Complication of anesthesia   . Family history of adverse reaction to anesthesia    PONV mother  . Hyperlipidemia   . Hypertension   . Melanoma ([place]) 2012  . PONV (postoperative nausea and vomiting) 04/16/2019  . Presence of permanent cardiac pacemaker    [employer]  . Seizures ([place])     Past Surgical History:  Procedure Laterality Date  . KNEE SURGERY Left   . LEFT HEART CATH AND CORONARY ANGIOGRAPHY Left 06/29/2017   Procedure: LEFT HEART CATH AND CORONARY ANGIOGRAPHY;  Surgeon: [name], MD;  Location: [facility];  Service: Cardiovascular;  Laterality: Left;  . LYMPH NODE DISSECTION Left 04/16/2019   Procedure: Left inguinal Lymph Node Dissection;  Surgeon: [name], MD;  Location: [facility];  Service: General;  Laterality: Left;  Marland Kitchen MELANOMA EXCISION Left 04/16/2019   Procedure: MELANOMA EXCISION LEFT GROIN MASS;  Surgeon: [name], MD;  Location: [facility];  Service: General;  Laterality: Left;  Marland Kitchen MELANOMA EXCISION WITH SENTINEL LYMPH NODE BIOPSY Left 2012   Left calf   . PACEMAKER  INSERTION N/A 08/26/2018   Procedure: INSERTION PACEMAKER;  Surgeon: [name], MD;  Location: [facility];  Service: Cardiovascular;  Laterality: N/A;  . PORTA CATH INSERTION N/A 08/26/2019   Procedure: PORTA CATH INSERTION;  Surgeon: Katha Cabal, MD;  Location: [facility];  Service: Cardiovascular;  Laterality: N/A;  . TEMPORARY PACEMAKER N/A 08/25/2018   Procedure: TEMPORARY PACEMAKER;  Surgeon: [name], MD;  Location: [facility];  Service: Cardiovascular;  Laterality: N/A;    There were no vitals filed for this visit.   Subjective Assessment - 01/13/20 1120    Subjective  Edward Pitts presents to OT for visit 42/72 to address LLE lymphedema 2/2 melanoma Rx. Edward Pitts presents in remade , custom, ccl 2 LLE , thigh length compression garment in place. Pt reports remade garment stays up and fits well. He reports garment is comfortable, but his wife still struggles sometimes with putting it on him. 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 w/ LN disection (-7/7 LN); Completed adjuvant XRT 07/07/2019; 08/2018 Pacemaker placed; L knee sx date?;    Limitations chronic LLE knee pain; post surgical LLE pain,  chronic LLE/LLQ swelling, decreased LLE strength, impaired gait, decreased dynamic balance, altered sensation LLE, increased risk  of infection, decreased standing and walking tolerance > 1 hr; decreased ankle AROM    Repetition Increases Symptoms    Special Tests +Stemmer base L toes    Patient Stated Goals reduce swelling to normal and keep it from getting worse    Pain Onset More than a month ago                        OT Treatments/Exercises (OP) - 01/13/20 0001      ADLs   ADL Education Given Yes      Manual Therapy   Manual Therapy Edema management;Manual Lymphatic Drainage (MLD)    Manual therapy comments LLE  comparative limb volumetrics    Edema Management fitting custom compression thigh high- REMAKE    Compression Bandaging Max A to don custom thigh length compression stocking using AE                  OT Education - 01/13/20 1658    Education Details Continued skilled Pt/caregiver education  And LE ADL training throughout visit for lymphedema self care/ home program, including compression wrapping, compression garment and device wear/care, lymphatic pumping ther ex, simple self-MLD, and skin care. Discussed progress towards goals.    Person(s) Educated Patient;Spouse    Methods Explanation;Demonstration;Handout    Comprehension Verbalized understanding;Returned demonstration;Need further instruction               OT Long Term Goals - 01/13/20 1659      OT LONG TERM GOAL #1   Title Pt will be able to apply thigh length, multi-layer, short stretch compression wraps daily to single limb using correct gradient techniques with Maximum assistance to achieve optimal limb volume reduction, to return affected limb/s, as closely as possible, to premorbid size and shape, to limit infection risk, and to improve safe functional ambulation and mobility.    Status Achieved   Pt unable to bend left knee to extent needed to appy compression garments independently. Cargiver is independent with gradient techniques.     OT LONG TERM GOAL #2   Title Pt will be able to verbalize signs and symptoms of cellulitis infection and identify at least 4 common lymphedema precautions using printed resource for reference to limit LE progression over time to limit risk of infection and LE exacerbation.    Status Achieved      OT LONG TERM GOAL #3   Status Achieved      OT LONG TERM GOAL #4   Status Achieved   achieved this date.. Goal met for LLE     OT LONG TERM GOAL #5   Status Achieved      OT LONG TERM GOAL #6   Status Not Met   Pt mod A with donning and doffing with spouse's assistance                 Plan - 01/13/20 1659    Clinical Impression Statement Pt pleased with remade custom Elvarex thigh high compression garment after wearing it for one week. Garment appears to fit well with slightly longer length, and provides improved containment of swelling at ankle compared with iniitial ccl 3 flat knit garment. Pt is usning Flexitouch device several times a week as directed. He has resumed physical exercise regime at a local gym 3-4 x weekly. Pt has met all but one goal for donning/ doffing compression garments. Knee OA pain limits AROM for this ADL. His wife continues to assist hijme  daily. Pt agrees with plan to transition from Intensive to Self-management phase of CDT today. He'll return for 7 follow-along in 7-8 weeks and call PRN. Cont as per POC.    OT Occupational Profile and History Comprehensive Assessment- Review of records and extensive additional review of physical, cognitive, psychosocial history related to current functional performance    Occupational performance deficits (Please refer to evaluation for details): ADL's;IADL's;Rest and Sleep;Work;Leisure;Social Participation;Other   body image, role performance   Body Structure / Function / Physical Skills ADL;ROM;Scar mobility;IADL;Edema;Balance;Sensation;Skin integrity;Mobility;Flexibility;Strength;Decreased knowledge of precautions;Gait;Pain;Decreased knowledge of use of DME    Rehab Potential Good    Clinical Decision Making Several treatment options, min-mod task modification necessary    Comorbidities Affecting Occupational Performance: Presence of comorbidities impacting occupational performance    Modification or Assistance to Complete Evaluation  Min-Moderate modification of tasks or assist with assess necessary to complete eval    OT Frequency 2x / week    OT Duration 12 weeks   and PRN   OT Treatment/Interventions Self-care/ADL training;Therapeutic exercise;Functional Mobility Training;Manual Therapy;Coping  strategies training;Therapeutic activities;Manual lymph drainage;Energy conservation;DME and/or AE instruction;Compression bandaging;Other (comment);Scar mobilization;Patient/family education   fit with appropriate compression garments once swelling is reduced   Plan Complete Decongestive Therapy (CDT), Intensive and Management Phases to include  manual lymphatyic drainage (MLD), skin care, therapeutic exercise, compression with short stretch wraps and garments, Pt edu for LE sef care    Consulted and Agree with Plan of Care Patient           Patient will benefit from skilled therapeutic intervention in order to improve the following deficits and impairments:   Body Structure / Function / Physical Skills: ADL, ROM, Scar mobility, IADL, Edema, Balance, Sensation, Skin integrity, Mobility, Flexibility, Strength, Decreased knowledge of precautions, Gait, Pain, Decreased knowledge of use of DME       Visit Diagnosis: Lymphedema, not elsewhere classified    Problem List Patient Active Problem List   Diagnosis Date Noted  . Dizziness 12/22/2019  . Arthritis of left knee 11/27/2019  . Vitiligo 09/03/2019  . Port-A-Cath in place 08/20/2019  . Encounter for antineoplastic immunotherapy 08/06/2019  . Malignant melanoma of overlapping sites ([place]) 04/23/2019  . Goals of care, counseling/discussion 04/23/2019  . 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 Near syncope 12/17/2018  . Anemia in chronic kidney disease 12/11/2018  . Benign hypertensive kidney disease with chronic kidney disease 12/11/2018  . Stage 3a chronic kidney disease ([place]) 12/11/2018  . AKI (acute kidney injury) ([place]) 08/24/2018  . Acute hyperkalemia 08/24/2018  . HTN (hypertension) 08/24/2018  . HLD (hyperlipidemia) 08/24/2018  . Seizures ([place]) 08/24/2018  . Complete heart block ([place]) 08/24/2018  . Arthritis of wrist, left 06/12/2018  . Bradycardia 06/04/2018  . Chronic gouty arthropathy without  tophi 03/13/2018  . Positive ANA (antinuclear antibody) 03/05/2018  . Swelling of joint of left wrist 03/05/2018  . Coronary artery disease involving native coronary artery of native heart 07/09/2017  . Palpitations 07/09/2017  . Stable angina ([place]) 06/27/2017  . Benign essential HTN 05/25/2017  . LBBB (left bundle branch block) 05/25/2017    Edward Spearman, MS, OTR/L, [facility] 01/13/20 5:18 PM  [place] MAIN [facility] SERVICES [address] [place], [place], [address] Phone: [phone]   Fax:  [phone]  Name: Edward Pitts MRN: [id number] Date of Birth: 08-23-1960

## 2020-01-21 ENCOUNTER — Encounter: Payer: 59 | Admitting: Occupational Therapy

## 2020-01-22 ENCOUNTER — Encounter: Payer: Self-pay | Admitting: Oncology

## 2020-01-22 ENCOUNTER — Other Ambulatory Visit: Payer: Self-pay | Admitting: Oncology

## 2020-01-22 ENCOUNTER — Inpatient Hospital Stay: Payer: 59

## 2020-01-22 ENCOUNTER — Inpatient Hospital Stay (HOSPITAL_BASED_OUTPATIENT_CLINIC_OR_DEPARTMENT_OTHER): Payer: 59 | Admitting: Oncology

## 2020-01-22 ENCOUNTER — Other Ambulatory Visit: Payer: Self-pay

## 2020-01-22 ENCOUNTER — Ambulatory Visit
Admission: RE | Admit: 2020-01-22 | Discharge: 2020-01-22 | Disposition: A | Discharge status: Home / Self Care | Payer: 59 | Source: Ambulatory Visit | Attending: Oncology | Admitting: Oncology

## 2020-01-22 VITALS — BP 138/70 | HR 65 | Temp 97.8°F | Resp 18 | Wt 235.9 lb

## 2020-01-22 DIAGNOSIS — C438 Malignant melanoma of overlapping sites of skin: Secondary | ICD-10-CM

## 2020-01-22 DIAGNOSIS — M25552 Pain in left hip: Secondary | ICD-10-CM

## 2020-01-22 DIAGNOSIS — Z5112 Encounter for antineoplastic immunotherapy: Secondary | ICD-10-CM

## 2020-01-22 DIAGNOSIS — M1712 Unilateral primary osteoarthritis, left knee: Secondary | ICD-10-CM

## 2020-01-22 DIAGNOSIS — L80 Vitiligo: Secondary | ICD-10-CM

## 2020-01-22 LAB — COMPREHENSIVE METABOLIC PANEL
ALT: 32 U/L (ref 0–44)
AST: 27 U/L (ref 15–41)
Albumin: 4.3 g/dL (ref 3.5–5.0)
Alkaline Phosphatase: 83 U/L (ref 38–126)
Anion gap: 7 (ref 5–15)
BUN: 25 mg/dL — ABNORMAL HIGH (ref 6–20)
CO2: 25 mmol/L (ref 22–32)
Calcium: 9.1 mg/dL (ref 8.9–10.3)
Chloride: 108 mmol/L (ref 98–111)
Creatinine, Ser: 1.14 mg/dL (ref 0.61–1.24)
GFR, Estimated: >60 mL/min (ref >60)
Glucose, Bld: 129 mg/dL — ABNORMAL HIGH (ref 70–99)
Potassium: 4.1 mmol/L (ref 3.5–5.1)
Sodium: 140 mmol/L (ref 135–145)
Total Bilirubin: 0.7 mg/dL (ref 0.3–1.2)
Total Protein: 7.2 g/dL (ref 6.5–8.1)

## 2020-01-22 LAB — CBC WITH DIFFERENTIAL/PLATELET
Abs Immature Granulocytes: 0.04 10*3/uL (ref 0.00–0.07)
Basophils Absolute: 0 10*3/uL (ref 0.0–0.1)
Basophils Relative: 1 %
Eosinophils Absolute: 0.3 10*3/uL (ref 0.0–0.5)
Eosinophils Relative: 7 %
HCT: 35.7 % — ABNORMAL LOW (ref 39.0–52.0)
Hemoglobin: 12.2 g/dL — ABNORMAL LOW (ref 13.0–17.0)
Immature Granulocytes: 1 %
Lymphocytes Relative: 19 %
Lymphs Abs: 0.9 10*3/uL (ref 0.7–4.0)
MCH: 30.2 pg (ref 26.0–34.0)
MCHC: 34.2 g/dL (ref 30.0–36.0)
MCV: 88.4 fL (ref 80.0–100.0)
Monocytes Absolute: 0.4 10*3/uL (ref 0.1–1.0)
Monocytes Relative: 9 %
Neutro Abs: 3 10*3/uL (ref 1.7–7.7)
Neutrophils Relative %: 63 %
Platelets: 225 10*3/uL (ref 150–400)
RBC: 4.04 MIL/uL — ABNORMAL LOW (ref 4.22–5.81)
RDW: 13 % (ref 11.5–15.5)
WBC: 4.8 10*3/uL (ref 4.0–10.5)
nRBC: 0 % (ref 0.0–0.2)

## 2020-01-22 LAB — TSH: TSH: 2.604 u[IU]/mL (ref 0.350–4.500)

## 2020-01-22 LAB — LACTATE DEHYDROGENASE: LDH: 118 U/L (ref 98–192)

## 2020-01-22 MED ORDER — SODIUM CHLORIDE 0.9 % IV SOLN
Freq: Once | INTRAVENOUS | Status: AC
  Filled 2020-01-22: qty 250

## 2020-01-22 MED ORDER — SODIUM CHLORIDE 0.9 % IV SOLN
240.0000 mg | Freq: Once | INTRAVENOUS | Status: AC
  Administered 2020-01-22: 240 mg via INTRAVENOUS
  Filled 2020-01-22: qty 24

## 2020-01-22 MED ORDER — HEPARIN SOD (PORK) LOCK FLUSH 100 UNIT/ML IV SOLN
INTRAVENOUS | Status: AC
  Filled 2020-01-22: qty 5

## 2020-01-22 MED ORDER — SODIUM CHLORIDE 0.9% FLUSH
10.0000 mL | Freq: Once | INTRAVENOUS | Status: AC
  Administered 2020-01-22: 10 mL via INTRAVENOUS
  Filled 2020-01-22: qty 10

## 2020-01-22 MED ORDER — HEPARIN SOD (PORK) LOCK FLUSH 100 UNIT/ML IV SOLN
500.0000 [IU] | Freq: Once | INTRAVENOUS | Status: AC | PRN
  Administered 2020-01-22: 500 [IU]
  Filled 2020-01-22: qty 5

## 2020-01-22 NOTE — Progress Notes (Signed)
New Left hip pain, 8/10 pain scale, and feels like it wants to "lock up".  Chronic left knee pain, 10/10 pain scale, is not improving.

## 2020-01-22 NOTE — Progress Notes (Signed)
Hematology/Oncology follow up  note [facility] [phone] [phone]   Patient Care Team: [name], [name], MD as PCP - General (Student)  REFERRING PROVIDER: Cherylann Parr, MD  CHIEF COMPLAINTS/REASON FOR VISIT:  Follow up for melanoma HISTORY OF PRESENTING ILLNESS:   Edward Pitts is a  59 y.o.  male with PMH listed below was seen in consultation at the request of  [name], [name], MD  for evaluation of inguinal mass Patient presented to emergency room 3 days ago complaining about left ing uinal mass discomfort. Reports that he has really noticed the mass growing for the past 1 months. He has a history of left lower extremity melanoma in 2011, status post local excision.  Pain was increased with squatting of laxation. He was advised to take Tylenol for pain. Denies any fever, chills, night sweating.  He does feel mild nauseated. Appetite is fair.  He has lost about 10 pounds since earlier this year. 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  Left inguinal and left iliac nodes which are suspicious for involvement.  There are also 2 small nonspecific hypodense lesions within the right liver, nonspecific.  # patient underwent left groin mass resection On 04/16/2019. Resection pathology showed malignant melanoma, replacing a lymph node, with extracapsular extension, peripheral and deep margins involved.  Left inguinal contents, all 7 lymph nodes were negative for melanoma in the lymph nodes.  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  # PDL1 80% TPS  # 04/16/2019. underwent left groin mass resection   07/07/2019  Status post adjuvant radiation and finished radiation  Patient has Mediport placed to facilitate immunotherapy treatments. INTERVAL HISTORY Edward Pitts is a 59 y.o. male who has above history  reviewed by me today presents for follow up visit for management of inguinal nodal recurrence of melanoma Problems and complaints are listed below: No diarrhea.  Worsening of left knee pain 10/10 and left hip pain, 8/10. Worse with movement.    . Review of Systems  Constitutional: Negative for appetite change, chills, fatigue, fever and unexpected weight change.  HENT:   Negative for hearing loss and voice change.   Eyes: Negative for eye problems and icterus.  Respiratory: Negative for chest tightness, cough and shortness of breath.   Cardiovascular: Negative for chest pain and leg swelling.  Gastrointestinal: Negative for abdominal distention and abdominal pain.  Endocrine: Negative for hot flashes.  Genitourinary: Negative for difficulty urinating, dysuria and frequency.   Musculoskeletal: Positive for arthralgias.  Skin: Negative for itching and rash.       Skin hypo-pigmentation on upper extremities, no change  Neurological: Negative for light-headedness and numbness.  Hematological: Negative for adenopathy. Does not bruise/bleed easily.  Psychiatric/Behavioral: Negative for confusion.    MEDICAL HISTORY:  Past Medical History:  Diagnosis Date  . Anxiety   . Arthritis   . Complication of anesthesia   . Family history of adverse reaction to anesthesia    PONV mother  . Hyperlipidemia   . Hypertension   . Melanoma ([facility]) 2012  . PONV (postoperative nausea and vomiting) 04/16/2019  . Presence of permanent cardiac pacemaker    [employer]tronic  . Seizures ([place])     SURGICAL HISTORY: Past Surgical History:  Procedure Laterality Date  . KNEE SURGERY Left   . LEFT HEART CATH AND CORONARY ANGIOGRAPHY Left 06/29/2017   Procedure: LEFT  HEART CATH AND CORONARY ANGIOGRAPHY;  Surgeon: [name], MD;  Location: [place] [facility];  Service: Cardiovascular;  Laterality: Left;  . LYMPH NODE DISSECTION Left 04/16/2019   Procedure: Left inguinal Lymph Node Dissection;  Surgeon:  [name], MD;  Location: [place];  Service: General;  Laterality: Left;  Marland Kitchen MELANOMA EXCISION Left 04/16/2019   Procedure: MELANOMA EXCISION LEFT GROIN MASS;  Surgeon: [name], MD;  Location: [place];  Service: General;  Laterality: Left;  Marland Kitchen MELANOMA EXCISION WITH SENTINEL LYMPH NODE BIOPSY Left 2012   Left calf   . PACEMAKER INSERTION N/A 08/26/2018   Procedure: INSERTION PACEMAKER;  Surgeon: [name], MD;  Location: [facility];  Service: Cardiovascular;  Laterality: N/A;  . PORTA CATH INSERTION N/A 08/26/2019   Procedure: PORTA CATH INSERTION;  Surgeon: Katha Cabal, MD;  Location: [name] [facility];  Service: Cardiovascular;  Laterality: N/A;  . TEMPORARY PACEMAKER N/A 08/25/2018   Procedure: TEMPORARY PACEMAKER;  Surgeon: [name], MD;  Location: [place] [facility];  Service: Cardiovascular;  Laterality: N/A;    SOCIAL HISTORY: Social History   Socioeconomic History  . Marital status: Married    Spouse name: Vicente Males   . Number of children: 7  . Years of education: 32  . Highest education level: Not on file  Occupational History  . Occupation: [employer]   Tobacco Use  . Smoking status: Never Smoker  . Smokeless tobacco: Never Used  Vaping Use  . Vaping Use: Never used  Substance and Sexual Activity  . Alcohol use: No  . Drug use: No  . Sexual activity: Not on file  Other Topics Concern  . Not on file  Social History Narrative   Lives with mother, wife and sister   Caffeine use: sodas (2 per day)   Social Determinants of Health   Financial Resource Strain: Low Risk   . Difficulty of Paying Living Expenses: Not hard at all  Food Insecurity: No Food Insecurity  . Worried About Charity fundraiser in the Last Year: Never true  . Ran Out of Food in the Last Year: Never true  Transportation Needs: Unmet Transportation Needs  . Lack of Transportation (Medical): Yes  . Lack of Transportation (Non-Medical): Yes  Physical Activity: Unknown  . Days of  Exercise per Week: 0 days  . Minutes of Exercise per Session: Not on file  Stress: No Stress Concern Present  . Feeling of Stress : Only a little  Social Connections: Unknown  . Frequency of Communication with Friends and Family: More than three times a week  . Frequency of Social Gatherings with Friends and Family: Not on file  . Attends Religious Services: Not on file  . Active Member of Clubs or Organizations: Not on file  . Attends Archivist Meetings: Not on file  . Marital Status: Married  Human resources officer Violence: Not At Risk  . Fear of Current or Ex-Partner: No  . Emotionally Abused: No  . Physically Abused: No  . Sexually Abused: No    FAMILY HISTORY: Family History  Problem Relation Age of Onset  . Cancer Paternal Grandmother     ALLERGIES:  is allergic to ibuprofen and nsaids.  MEDICATIONS:  Current Outpatient Medications  Medication Sig Dispense Refill  . acetaminophen (TYLENOL) 650 MG CR tablet Take 650 mg by mouth every 8 (eight) hours as needed for pain.     Marland Kitchen allopurinol (ZYLOPRIM) 300 MG tablet Take 300 mg by mouth daily.    Marland Kitchen  aspirin 81 MG chewable tablet Chew 81 mg by mouth daily.    Marland Kitchen atorvastatin (LIPITOR) 20 MG tablet Take 20 mg by mouth daily.    . clonazePAM (KLONOPIN) 0.5 MG tablet 1 tablet in the morning, 2 in the evening 90 tablet 3  . diclofenac Sodium (VOLTAREN) 1 % GEL Apply 2 g topically 4 (four) times daily. 50 g 0  . hydrocortisone 2.5 % cream Apply topically daily as needed.    Marland Kitchen ketoconazole (NIZORAL) 2 % cream Apply 1 application topically daily as needed for irritation.    . lidocaine-prilocaine (EMLA) cream Apply 1 application topically as needed. Apply small amount of cream to port site approx 1-2 hours prior to appointment. 30 g 2  . lisinopril (ZESTRIL) 20 MG tablet Take 20 mg by mouth daily.    . metoprolol succinate (TOPROL-XL) 25 MG 24 hr tablet Take 25 mg by mouth daily.     . Multiple Vitamin (MULTIVITAMIN WITH MINERALS)  TABS tablet Take 1 tablet by mouth daily. Centrum Silver    . silver sulfADIAZINE (SILVADENE) 1 % cream Apply 1 application topically 2 (two) times daily. (Patient taking differently: Apply 1 application topically as needed. ) 50 g 2   No current facility-administered medications for this visit.     PHYSICAL EXAMINATION: ECOG PERFORMANCE STATUS: 1 - Symptomatic but completely ambulatory Vitals:   01/22/20 0913  BP: 138/70  Pulse: 65  Resp: 18  Temp: 97.8 F (36.6 C)   Filed Weights   01/22/20 0913  Weight: 235 lb 14.4 oz (107 kg)    Physical Exam Constitutional:      General: He is not in acute distress. HENT:     Head: Normocephalic and atraumatic.  Eyes:     General: No scleral icterus.    Pupils: Pupils are equal, round, and reactive to light.  Cardiovascular:     Rate and Rhythm: Normal rate and regular rhythm.     Heart sounds: Normal heart sounds.  Pulmonary:     Effort: Pulmonary effort is normal. No respiratory distress.     Breath sounds: No wheezing.  Abdominal:     General: Bowel sounds are normal. There is no distension.     Palpations: Abdomen is soft. There is no mass.     Tenderness: There is no abdominal tenderness.     Comments:  History left groin mass resection and radiation.   Musculoskeletal:        General: No deformity. Normal range of motion.     Cervical back: Normal range of motion and neck supple.     Comments: Left lower extremity edema  Skin:    General: Skin is warm and dry.     Findings: No erythema or rash.  Neurological:     Mental Status: He is alert and oriented to person, place, and time. Mental status is at baseline.     Cranial Nerves: No cranial nerve deficit.     Coordination: Coordination normal.  Psychiatric:        Mood and Affect: Mood normal.    LABORATORY DATA:  I have reviewed the data as listed Lab Results  Component Value Date   WBC 4.8 01/22/2020   HGB 12.2 (L) 01/22/2020   HCT 35.7 (L) 01/22/2020   MCV  88.4 01/22/2020   PLT 225 01/22/2020   Recent Labs    11/27/19 0833 11/27/19 1856 12/11/19 0854 12/11/19 0854 12/25/19 0905 01/08/20 0851 01/22/20 0900  NA 140   < >  139   < > 140 139 140  K 4.1   < > 3.8   < > 4.4 3.9 4.1  CL 106   < > 104   < > 106 105 108  CO2 24   < > 27   < > _0 GLUCOSE 125*   < > 118*   < > 109* 140* 129*  BUN 20   < > 25*   < > 20 24* 25*  CREATININE 1.10   < > 1.16   < > 1.22 1.11 1.14  CALCIUM 9.0   < > 9.1   < > 9.1 8.9 9.1  GFRNONAA >60   < > >60   < > >60 >60 >60  GFRAA >60  --  >60  --  >60  --   --   PROT 7.1   < > 7.3   < > 7.0 7.1 7.2  ALBUMIN 4.0   < > 4.2   < > 4.1 4.2 4.3  AST 29   < > 28   < > _1 ALT 40   < > 39   < > 32 36 32  ALKPHOS 89   < > 80   < > 80 75 83  BILITOT 0.6   < > 0.8   < > 0.9 0.8 0.7   < > = values in this interval not displayed.   Iron/TIBC/Ferritin/ %Sat    Component Value Date/Time   IRON 85 10/02/2019 0841   TIBC 342 10/02/2019 0841   FERRITIN 61 10/02/2019 0841   IRONPCTSAT 25 10/02/2019 0841      RADIOGRAPHIC STUDIES: I have personally reviewed the radiological images as listed and agreed with the findings in the report. CT CHEST ABDOMEN PELVIS W CONTRAST  Result Date: 12/10/2019 CLINICAL DATA:  Malignant melanoma. LEFT calf excision 2012. Recurrence to LEFT inguinal lymph node January 21. EXAM: CT CHEST, ABDOMEN, AND PELVIS WITH CONTRAST TECHNIQUE: Multidetector CT imaging of the chest, abdomen and pelvis was performed following the standard protocol during bolus administration of intravenous contrast. CONTRAST:  138m OMNIPAQUE IOHEXOL 300 MG/ML  SOLN COMPARISON:  CT 09/10/2019, PET-CT 02/11/2019 FINDINGS: CT CHEST FINDINGS Cardiovascular: Port in the anterior chest wall with tip in distal SVC. LEFT-sided pacemaker noted. No significant vascular findings. Normal heart size. No pericardial effusion. Mediastinum/Nodes: No axillary or supraclavicular adenopathy. No mediastinal or hilar adenopathy.  No pericardial fluid. Esophagus normal. Lungs/Pleura: No suspicious pulmonary nodules. Musculoskeletal: No aggressive osseous lesion. CT ABDOMEN AND PELVIS FINDINGS Hepatobiliary: Hypodense lesion in the RIGHT hepatic lobe measuring 7 mm unchanged. Similar smaller lesion on image 65/2 is also unchanged. Normal gallbladder. Pancreas: Pancreas is normal. No ductal dilatation. No pancreatic inflammation. Spleen: Normal spleen Adrenals/urinary tract: Adrenal glands and kidneys are normal. The ureters and bladder normal. Stomach/Bowel: Stomach, small-bowel and cecum are normal. The appendix is not identified but there is no pericecal inflammation to suggest appendicitis. The colon and rectosigmoid colon are normal. Vascular/Lymphatic: Abdominal aorta is normal caliber. There is no retroperitoneal or periportal lymphadenopathy. No pelvic lymphadenopathy. 6 mm gastrohepatic ligament lymph node (image 58/2) is unchanged Reproductive: Unremarkable Other: Postsurgical change in LEFT groin consistent prior groin mass resection. No evidence of local recurrence. Musculoskeletal: No aggressive osseous lesion. IMPRESSION: Chest Impression: No evidence of thoracic metastasis Abdomen / Pelvis Impression: 1. Post LEFT groin mass resection.  No evidence local recurrence. 2. No evidence of metastatic disease the abdomen pelvis. 3. No evidence skeletal metastasis.  4. Stable small hypodensities in the RIGHT hepatic lobe. Electronically Signed   By: Suzy Bouchard M.D.   On: 12/10/2019 11:13       ASSESSMENT & PLAN:  1. Malignant melanoma of overlapping sites ([place])   2. Vitiligo   3. Encounter for antineoplastic immunotherapy   4. Arthritis of left knee   5. Left hip pain    #Left inguinal nodal recurrence of melanoma.  Status post resection and adjuvant radiation. Labs are reviewed and discussed with patient. Counts are acceptable to proceed with today's nivolumab treatment.  Plan to repeat CT in December.   # Left  hip and knee pain, acute on chronic, likely worsen due to lymphedema.  Check left hip and knee xray.  Refer to orthopedic surgeon.  Tylenol as needed for pain.    #Vitiligo, stable #Normocytic anemia, stable. All questions were answered. The patient knows to call the clinic with any problems questions or concerns.  Follow-up with 2 weeks. We spent sufficient time to discuss many aspect of care, questions were answered to patient's satisfaction.   [name], MD, PhD Hematology Oncology [facility] Pager- [phone] 01/22/2020

## 2020-01-23 ENCOUNTER — Encounter: Payer: Self-pay | Admitting: Oncology

## 2020-01-28 ENCOUNTER — Encounter: Payer: 59 | Admitting: Occupational Therapy

## 2020-02-04 ENCOUNTER — Encounter: Payer: 59 | Admitting: Occupational Therapy

## 2020-02-05 ENCOUNTER — Inpatient Hospital Stay

## 2020-02-05 ENCOUNTER — Encounter: Payer: Self-pay | Admitting: Oncology

## 2020-02-05 ENCOUNTER — Other Ambulatory Visit: Payer: Self-pay

## 2020-02-05 ENCOUNTER — Inpatient Hospital Stay: Attending: Oncology

## 2020-02-05 ENCOUNTER — Inpatient Hospital Stay (HOSPITAL_BASED_OUTPATIENT_CLINIC_OR_DEPARTMENT_OTHER): Admitting: Oncology

## 2020-02-05 VITALS — BP 116/64 | HR 63 | Temp 97.1°F | Resp 18 | Wt 235.8 lb

## 2020-02-05 DIAGNOSIS — C4359 Malignant melanoma of other part of trunk: Secondary | ICD-10-CM | POA: Insufficient documentation

## 2020-02-05 DIAGNOSIS — M1712 Unilateral primary osteoarthritis, left knee: Secondary | ICD-10-CM | POA: Diagnosis not present

## 2020-02-05 DIAGNOSIS — C438 Malignant melanoma of overlapping sites of skin: Secondary | ICD-10-CM

## 2020-02-05 DIAGNOSIS — Z5112 Encounter for antineoplastic immunotherapy: Secondary | ICD-10-CM

## 2020-02-05 DIAGNOSIS — L80 Vitiligo: Secondary | ICD-10-CM | POA: Insufficient documentation

## 2020-02-05 DIAGNOSIS — M1612 Unilateral primary osteoarthritis, left hip: Secondary | ICD-10-CM | POA: Insufficient documentation

## 2020-02-05 DIAGNOSIS — D649 Anemia, unspecified: Secondary | ICD-10-CM | POA: Insufficient documentation

## 2020-02-05 LAB — CBC WITH DIFFERENTIAL/PLATELET
Abs Immature Granulocytes: 0.05 10*3/uL (ref 0.00–0.07)
Basophils Absolute: 0 10*3/uL (ref 0.0–0.1)
Basophils Relative: 1 %
Eosinophils Absolute: 0.3 10*3/uL (ref 0.0–0.5)
Eosinophils Relative: 6 %
HCT: 35.6 % — ABNORMAL LOW (ref 39.0–52.0)
Hemoglobin: 12.1 g/dL — ABNORMAL LOW (ref 13.0–17.0)
Immature Granulocytes: 1 %
Lymphocytes Relative: 20 %
Lymphs Abs: 1 10*3/uL (ref 0.7–4.0)
MCH: 30 pg (ref 26.0–34.0)
MCHC: 34 g/dL (ref 30.0–36.0)
MCV: 88.1 fL (ref 80.0–100.0)
Monocytes Absolute: 0.5 10*3/uL (ref 0.1–1.0)
Monocytes Relative: 10 %
Neutro Abs: 3.2 10*3/uL (ref 1.7–7.7)
Neutrophils Relative %: 62 %
Platelets: 216 10*3/uL (ref 150–400)
RBC: 4.04 MIL/uL — ABNORMAL LOW (ref 4.22–5.81)
RDW: 12.8 % (ref 11.5–15.5)
WBC: 5.2 10*3/uL (ref 4.0–10.5)
nRBC: 0 % (ref 0.0–0.2)

## 2020-02-05 LAB — COMPREHENSIVE METABOLIC PANEL
ALT: 37 U/L (ref 0–44)
AST: 29 U/L (ref 15–41)
Albumin: 4.3 g/dL (ref 3.5–5.0)
Alkaline Phosphatase: 81 U/L (ref 38–126)
Anion gap: 7 (ref 5–15)
BUN: 37 mg/dL — ABNORMAL HIGH (ref 6–20)
CO2: 25 mmol/L (ref 22–32)
Calcium: 9.4 mg/dL (ref 8.9–10.3)
Chloride: 107 mmol/L (ref 98–111)
Creatinine, Ser: 1.16 mg/dL (ref 0.61–1.24)
GFR, Estimated: >60 mL/min (ref >60)
Glucose, Bld: 117 mg/dL — ABNORMAL HIGH (ref 70–99)
Potassium: 4.5 mmol/L (ref 3.5–5.1)
Sodium: 139 mmol/L (ref 135–145)
Total Bilirubin: 0.6 mg/dL (ref 0.3–1.2)
Total Protein: 7.5 g/dL (ref 6.5–8.1)

## 2020-02-05 MED ORDER — HEPARIN SOD (PORK) LOCK FLUSH 100 UNIT/ML IV SOLN
INTRAVENOUS | Status: AC
  Filled 2020-02-05: qty 5

## 2020-02-05 MED ORDER — HEPARIN SOD (PORK) LOCK FLUSH 100 UNIT/ML IV SOLN
500.0000 [IU] | Freq: Once | INTRAVENOUS | Status: AC | PRN
  Administered 2020-02-05: 500 [IU]
  Filled 2020-02-05: qty 5

## 2020-02-05 MED ORDER — SODIUM CHLORIDE 0.9 % IV SOLN
240.0000 mg | Freq: Once | INTRAVENOUS | Status: AC
  Administered 2020-02-05: 240 mg via INTRAVENOUS
  Filled 2020-02-05: qty 24

## 2020-02-05 MED ORDER — SODIUM CHLORIDE 0.9 % IV SOLN
Freq: Once | INTRAVENOUS | Status: AC
  Filled 2020-02-05: qty 250

## 2020-02-05 NOTE — Progress Notes (Signed)
Pt here for follow up. No new concerns voiced. Pt has appt with orthopedics tomorrow.

## 2020-02-05 NOTE — Progress Notes (Signed)
Hematology/Oncology follow up  note [facility] [phone] [phone]   Patient Care Team: [name], [name], MD as PCP - General (Student)  REFERRING PROVIDER: Cherylann Parr, MD  CHIEF COMPLAINTS/REASON FOR VISIT:  Follow up for melanoma HISTORY OF PRESENTING ILLNESS:   Edward Pitts is a  59 y.o.  male with PMH listed below was seen in consultation at the request of  [name], [name], MD  for evaluation of inguinal mass Patient presented to emergency room 3 days ago complaining about left ing uinal mass discomfort. Reports that he has really noticed the mass growing for the past 1 months. He has a history of left lower extremity melanoma in 2011, status post local excision.  Pain was increased with squatting of laxation. He was advised to take Tylenol for pain. Denies any fever, chills, night sweating.  He does feel mild nauseated. Appetite is fair.  He has lost about 10 pounds since earlier this year. 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  Left inguinal and left iliac nodes which are suspicious for involvement.  There are also 2 small nonspecific hypodense lesions within the right liver, nonspecific.  # patient underwent left groin mass resection On 04/16/2019. Resection pathology showed malignant melanoma, replacing a lymph node, with extracapsular extension, peripheral and deep margins involved.  Left inguinal contents, all 7 lymph nodes were negative for melanoma in the lymph nodes.  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  # PDL1 80% TPS  # 04/16/2019. underwent left groin mass resection   07/07/2019  Status post adjuvant radiation and finished radiation  Patient has Mediport placed to facilitate immunotherapy treatments. INTERVAL HISTORY Edward Pitts is a 59 y.o. male who has above history  reviewed by me today presents for follow up visit for management of inguinal nodal recurrence of melanoma Problems and complaints are listed below: No diarrhea.  Continues to have left knee pain 10 out of 10 in the left hip pain 8 out of 10. Patient has been referred to orthopedic surgeon for further evaluation. No new complaints. Review of Systems  Constitutional: Negative for appetite change, chills, fatigue, fever and unexpected weight change.  HENT:   Negative for hearing loss and voice change.   Eyes: Negative for eye problems and icterus.  Respiratory: Negative for chest tightness, cough and shortness of breath.   Cardiovascular: Negative for chest pain and leg swelling.  Gastrointestinal: Negative for abdominal distention and abdominal pain.  Endocrine: Negative for hot flashes.  Genitourinary: Negative for difficulty urinating, dysuria and frequency.   Musculoskeletal: Positive for arthralgias.  Skin: Negative for itching and rash.       Skin hypo-pigmentation on upper extremities, no change  Neurological: Negative for light-headedness and numbness.  Hematological: Negative for adenopathy. Does not bruise/bleed easily.  Psychiatric/Behavioral: Negative for confusion.    MEDICAL HISTORY:  Past Medical History:  Diagnosis Date  . Anxiety   . Arthritis   . Complication of anesthesia   . Family history of adverse reaction to anesthesia    PONV mother  . Hyperlipidemia   . Hypertension   . Melanoma ([place]) 2012  . PONV (postoperative nausea and vomiting) 04/16/2019  . Presence of permanent cardiac pacemaker    [employer]  . Seizures ([place])     SURGICAL HISTORY: Past Surgical History:  Procedure Laterality Date  . KNEE SURGERY Left   .  LEFT HEART CATH AND CORONARY ANGIOGRAPHY Left 06/29/2017   Procedure: LEFT HEART CATH AND CORONARY ANGIOGRAPHY;  Surgeon: [name], MD;  Location: [place] [facility];  Service: Cardiovascular;  Laterality: Left;  . LYMPH NODE  DISSECTION Left 04/16/2019   Procedure: Left inguinal Lymph Node Dissection;  Surgeon: [name], MD;  Location: [place];  Service: General;  Laterality: Left;  Marland Kitchen MELANOMA EXCISION Left 04/16/2019   Procedure: MELANOMA EXCISION LEFT GROIN MASS;  Surgeon: [name], MD;  Location: [place];  Service: General;  Laterality: Left;  Marland Kitchen MELANOMA EXCISION WITH SENTINEL LYMPH NODE BIOPSY Left 2012   Left calf   . PACEMAKER INSERTION N/A 08/26/2018   Procedure: INSERTION PACEMAKER;  Surgeon: [name], MD;  Location: [facility];  Service: Cardiovascular;  Laterality: N/A;  . PORTA CATH INSERTION N/A 08/26/2019   Procedure: PORTA CATH INSERTION;  Surgeon: Katha Cabal, MD;  Location: [place] [facility];  Service: Cardiovascular;  Laterality: N/A;  . TEMPORARY PACEMAKER N/A 08/25/2018   Procedure: TEMPORARY PACEMAKER;  Surgeon: [name], MD;  Location: [place] [facility];  Service: Cardiovascular;  Laterality: N/A;    SOCIAL HISTORY: Social History   Socioeconomic History  . Marital status: Married    Spouse name: Vicente Males   . Number of children: 7  . Years of education: 62  . Highest education level: Not on file  Occupational History  . Occupation: [employer]   Tobacco Use  . Smoking status: Never Smoker  . Smokeless tobacco: Never Used  Vaping Use  . Vaping Use: Never used  Substance and Sexual Activity  . Alcohol use: No  . Drug use: No  . Sexual activity: Not on file  Other Topics Concern  . Not on file  Social History Narrative   Lives with mother, wife and sister   Caffeine use: sodas (2 per day)   Social Determinants of Health   Financial Resource Strain: Low Risk   . Difficulty of Paying Living Expenses: Not hard at all  Food Insecurity: No Food Insecurity  . Worried About Charity fundraiser in the Last Year: Never true  . Ran Out of Food in the Last Year: Never true  Transportation Needs: Unmet Transportation Needs  . Lack of Transportation (Medical): Yes   . Lack of Transportation (Non-Medical): Yes  Physical Activity: Unknown  . Days of Exercise per Week: 0 days  . Minutes of Exercise per Session: Not on file  Stress: No Stress Concern Present  . Feeling of Stress : Only a little  Social Connections: Unknown  . Frequency of Communication with Friends and Family: More than three times a week  . Frequency of Social Gatherings with Friends and Family: Not on file  . Attends Religious Services: Not on file  . Active Member of Clubs or Organizations: Not on file  . Attends Archivist Meetings: Not on file  . Marital Status: Married  Human resources officer Violence: Not At Risk  . Fear of Current or Ex-Partner: No  . Emotionally Abused: No  . Physically Abused: No  . Sexually Abused: No    FAMILY HISTORY: Family History  Problem Relation Age of Onset  . Cancer Paternal Grandmother     ALLERGIES:  is allergic to ibuprofen and nsaids.  MEDICATIONS:  Current Outpatient Medications  Medication Sig Dispense Refill  . acetaminophen (TYLENOL) 650 MG CR tablet Take 650 mg by mouth every 8 (eight) hours as needed for pain.     Marland Kitchen  allopurinol (ZYLOPRIM) 300 MG tablet Take 300 mg by mouth daily.    Marland Kitchen aspirin 81 MG chewable tablet Chew 81 mg by mouth daily.    Marland Kitchen atorvastatin (LIPITOR) 20 MG tablet Take 20 mg by mouth daily.    . clonazePAM (KLONOPIN) 0.5 MG tablet 1 tablet in the morning, 2 in the evening 90 tablet 3  . diclofenac Sodium (VOLTAREN) 1 % GEL Apply 2 g topically 4 (four) times daily. 50 g 0  . hydrocortisone 2.5 % cream Apply topically daily as needed.    Marland Kitchen ketoconazole (NIZORAL) 2 % cream Apply 1 application topically daily as needed for irritation.    . lidocaine-prilocaine (EMLA) cream Apply 1 application topically as needed. Apply small amount of cream to port site approx 1-2 hours prior to appointment. 30 g 2  . lisinopril (ZESTRIL) 20 MG tablet Take 20 mg by mouth daily.    . metoprolol succinate (TOPROL-XL) 25 MG 24 hr  tablet Take 25 mg by mouth daily.     . Multiple Vitamin (MULTIVITAMIN WITH MINERALS) TABS tablet Take 1 tablet by mouth daily. Centrum Silver    . silver sulfADIAZINE (SILVADENE) 1 % cream Apply 1 application topically 2 (two) times daily. (Patient taking differently: Apply 1 application topically as needed. ) 50 g 2   No current facility-administered medications for this visit.     PHYSICAL EXAMINATION: ECOG PERFORMANCE STATUS: 1 - Symptomatic but completely ambulatory Vitals:   02/05/20 0912  BP: 116/64  Pulse: 63  Resp: 18  Temp: (!) 97.1 F (36.2 C)   Filed Weights   02/05/20 0912  Weight: 235 lb 12.8 oz (107 kg)    Physical Exam Constitutional:      General: He is not in acute distress. HENT:     Head: Normocephalic and atraumatic.  Eyes:     General: No scleral icterus.    Pupils: Pupils are equal, round, and reactive to light.  Cardiovascular:     Rate and Rhythm: Normal rate and regular rhythm.     Heart sounds: Normal heart sounds.  Pulmonary:     Effort: Pulmonary effort is normal. No respiratory distress.     Breath sounds: No wheezing.  Abdominal:     General: Bowel sounds are normal. There is no distension.     Palpations: Abdomen is soft. There is no mass.     Tenderness: There is no abdominal tenderness.     Comments:  History left groin mass resection and radiation.   Musculoskeletal:        General: No deformity. Normal range of motion.     Cervical back: Normal range of motion and neck supple.     Comments: Left lower extremity edema  Skin:    General: Skin is warm and dry.     Findings: No erythema or rash.  Neurological:     Mental Status: He is alert and oriented to person, place, and time. Mental status is at baseline.     Cranial Nerves: No cranial nerve deficit.     Coordination: Coordination normal.  Psychiatric:        Mood and Affect: Mood normal.    LABORATORY DATA:  I have reviewed the data as listed Lab Results  Component  Value Date   WBC 4.8 01/22/2020   HGB 12.2 (L) 01/22/2020   HCT 35.7 (L) 01/22/2020   MCV 88.4 01/22/2020   PLT 225 01/22/2020   Recent Labs    11/27/19 0833 11/27/19 0833 12/11/19  2202 12/11/19 0854 12/25/19 0905 01/08/20 0851 01/22/20 0900  NA 140   < > 139   < > 140 139 140  K 4.1   < > 3.8   < > 4.4 3.9 4.1  CL 106   < > 104   < > 106 105 108  CO2 24   < > 27   < > $R'27 25 25  'Tz$ GLUCOSE 125*   < > 118*   < > 109* 140* 129*  BUN 20   < > 25*   < > 20 24* 25*  CREATININE 1.10   < > 1.16   < > 1.22 1.11 1.14  CALCIUM 9.0   < > 9.1   < > 9.1 8.9 9.1  GFRNONAA >60   < > >60   < > >60 >60 >60  GFRAA >60  --  >60  --  >60  --   --   PROT 7.1   < > 7.3   < > 7.0 7.1 7.2  ALBUMIN 4.0   < > 4.2   < > 4.1 4.2 4.3  AST 29   < > 28   < > $R'26 31 27  'mj$ ALT 40   < > 39   < > 32 36 32  ALKPHOS 89   < > 80   < > 80 75 83  BILITOT 0.6   < > 0.8   < > 0.9 0.8 0.7   < > = values in this interval not displayed.   Iron/TIBC/Ferritin/ %Sat    Component Value Date/Time   IRON 85 10/02/2019 0841   TIBC 342 10/02/2019 0841   FERRITIN 61 10/02/2019 0841   IRONPCTSAT 25 10/02/2019 0841      RADIOGRAPHIC STUDIES: I have personally reviewed the radiological images as listed and agreed with the findings in the report. CT CHEST ABDOMEN PELVIS W CONTRAST  Result Date: 12/10/2019 CLINICAL DATA:  Malignant melanoma. LEFT calf excision 2012. Recurrence to LEFT inguinal lymph node January 21. EXAM: CT CHEST, ABDOMEN, AND PELVIS WITH CONTRAST TECHNIQUE: Multidetector CT imaging of the chest, abdomen and pelvis was performed following the standard protocol during bolus administration of intravenous contrast. CONTRAST:  114mL OMNIPAQUE IOHEXOL 300 MG/ML  SOLN COMPARISON:  CT 09/10/2019, PET-CT 02/11/2019 FINDINGS: CT CHEST FINDINGS Cardiovascular: Port in the anterior chest wall with tip in distal SVC. LEFT-sided pacemaker noted. No significant vascular findings. Normal heart size. No pericardial effusion.  Mediastinum/Nodes: No axillary or supraclavicular adenopathy. No mediastinal or hilar adenopathy. No pericardial fluid. Esophagus normal. Lungs/Pleura: No suspicious pulmonary nodules. Musculoskeletal: No aggressive osseous lesion. CT ABDOMEN AND PELVIS FINDINGS Hepatobiliary: Hypodense lesion in the RIGHT hepatic lobe measuring 7 mm unchanged. Similar smaller lesion on image 65/2 is also unchanged. Normal gallbladder. Pancreas: Pancreas is normal. No ductal dilatation. No pancreatic inflammation. Spleen: Normal spleen Adrenals/urinary tract: Adrenal glands and kidneys are normal. The ureters and bladder normal. Stomach/Bowel: Stomach, small-bowel and cecum are normal. The appendix is not identified but there is no pericecal inflammation to suggest appendicitis. The colon and rectosigmoid colon are normal. Vascular/Lymphatic: Abdominal aorta is normal caliber. There is no retroperitoneal or periportal lymphadenopathy. No pelvic lymphadenopathy. 6 mm gastrohepatic ligament lymph node (image 58/2) is unchanged Reproductive: Unremarkable Other: Postsurgical change in LEFT groin consistent prior groin mass resection. No evidence of local recurrence. Musculoskeletal: No aggressive osseous lesion. IMPRESSION: Chest Impression: No evidence of thoracic metastasis Abdomen / Pelvis Impression: 1. Post LEFT groin mass resection.  No evidence  local recurrence. 2. No evidence of metastatic disease the abdomen pelvis. 3. No evidence skeletal metastasis. 4. Stable small hypodensities in the RIGHT hepatic lobe. Electronically Signed   By: Suzy Bouchard M.D.   On: 12/10/2019 11:13   DG HIP UNILAT WITH PELVIS 2-3 VIEWS LEFT  Result Date: 01/24/2020 CLINICAL DATA:  Left hip pain and stiffness, history of melanoma EXAM: DG HIP (WITH OR WITHOUT PELVIS) 2-3V LEFT COMPARISON:  None. FINDINGS: Frontal view of the pelvis as well as frontal and frogleg lateral views of the left hip are obtained. No fracture, subluxation, or  dislocation. There is moderate left hip osteoarthritis with superior joint space narrowing and osteophyte formation. Sacroiliac joints are normal. Prominent facet hypertrophic changes at the lumbosacral junction. IMPRESSION: 1. Moderate left hip osteoarthritis.  No acute fracture. 2. Lower lumbar facet hypertrophy. Electronically Signed   By: Randa Ngo M.D.   On: 01/24/2020 11:50       ASSESSMENT & PLAN:  1. Malignant melanoma of overlapping sites ([place])   2. Encounter for antineoplastic immunotherapy   3. Arthritis of left knee    #Left inguinal nodal recurrence of melanoma.  Status post resection and adjuvant radiation. Labs reviewed and discussed with patient. Counts acceptable to proceed with today's nivolumab treatment. Plan to repeat CT in December or January.  Discussed with patient   # Left hip and knee pain, acute on chronic, likely worsen due to lymphedema.  Refer to orthopedic surgeon.  Left hip x-ray showed moderate left hip osteoarthritis.  Lower lumbar facet hypertrophy   #Vitiligo, stable #Normocytic anemia, stable. All questions were answered. The patient knows to call the clinic with any problems questions or concerns.  Follow-up with 2 weeks. We spent sufficient time to discuss many aspect of care, questions were answered to patient's satisfaction.   [name], MD, PhD Hematology Oncology [facility] at [facility] Pager- [phone] 02/05/2020

## 2020-02-26 ENCOUNTER — Encounter: Payer: Self-pay | Admitting: Oncology

## 2020-02-26 ENCOUNTER — Inpatient Hospital Stay

## 2020-02-26 ENCOUNTER — Inpatient Hospital Stay (HOSPITAL_BASED_OUTPATIENT_CLINIC_OR_DEPARTMENT_OTHER): Admitting: Oncology

## 2020-02-26 ENCOUNTER — Inpatient Hospital Stay: Attending: Oncology

## 2020-02-26 VITALS — BP 136/74 | HR 88

## 2020-02-26 VITALS — BP 144/83 | HR 70 | Temp 96.6°F | Resp 18 | Wt 233.7 lb

## 2020-02-26 DIAGNOSIS — Z79899 Other long term (current) drug therapy: Secondary | ICD-10-CM | POA: Insufficient documentation

## 2020-02-26 DIAGNOSIS — L80 Vitiligo: Secondary | ICD-10-CM | POA: Diagnosis not present

## 2020-02-26 DIAGNOSIS — M1712 Unilateral primary osteoarthritis, left knee: Secondary | ICD-10-CM | POA: Diagnosis not present

## 2020-02-26 DIAGNOSIS — C4359 Malignant melanoma of other part of trunk: Secondary | ICD-10-CM | POA: Diagnosis present

## 2020-02-26 DIAGNOSIS — D649 Anemia, unspecified: Secondary | ICD-10-CM | POA: Insufficient documentation

## 2020-02-26 DIAGNOSIS — Z5112 Encounter for antineoplastic immunotherapy: Secondary | ICD-10-CM | POA: Diagnosis present

## 2020-02-26 DIAGNOSIS — C438 Malignant melanoma of overlapping sites of skin: Secondary | ICD-10-CM

## 2020-02-26 LAB — CBC WITH DIFFERENTIAL/PLATELET
Abs Immature Granulocytes: 0.06 10*3/uL (ref 0.00–0.07)
Basophils Absolute: 0 10*3/uL (ref 0.0–0.1)
Basophils Relative: 0 %
Eosinophils Absolute: 0.1 10*3/uL (ref 0.0–0.5)
Eosinophils Relative: 3 %
HCT: 37.1 % — ABNORMAL LOW (ref 39.0–52.0)
Hemoglobin: 12.4 g/dL — ABNORMAL LOW (ref 13.0–17.0)
Immature Granulocytes: 1 %
Lymphocytes Relative: 19 %
Lymphs Abs: 1 10*3/uL (ref 0.7–4.0)
MCH: 30 pg (ref 26.0–34.0)
MCHC: 33.4 g/dL (ref 30.0–36.0)
MCV: 89.8 fL (ref 80.0–100.0)
Monocytes Absolute: 0.4 10*3/uL (ref 0.1–1.0)
Monocytes Relative: 7 %
Neutro Abs: 3.4 10*3/uL (ref 1.7–7.7)
Neutrophils Relative %: 70 %
Platelets: 189 10*3/uL (ref 150–400)
RBC: 4.13 MIL/uL — ABNORMAL LOW (ref 4.22–5.81)
RDW: 13 % (ref 11.5–15.5)
WBC: 5 10*3/uL (ref 4.0–10.5)
nRBC: 0 % (ref 0.0–0.2)

## 2020-02-26 LAB — COMPREHENSIVE METABOLIC PANEL
ALT: 34 U/L (ref 0–44)
AST: 26 U/L (ref 15–41)
Albumin: 4.2 g/dL (ref 3.5–5.0)
Alkaline Phosphatase: 84 U/L (ref 38–126)
Anion gap: 7 (ref 5–15)
BUN: 36 mg/dL — ABNORMAL HIGH (ref 6–20)
CO2: 26 mmol/L (ref 22–32)
Calcium: 9.5 mg/dL (ref 8.9–10.3)
Chloride: 104 mmol/L (ref 98–111)
Creatinine, Ser: 1.12 mg/dL (ref 0.61–1.24)
GFR, Estimated: >60 mL/min (ref >60)
Glucose, Bld: 123 mg/dL — ABNORMAL HIGH (ref 70–99)
Potassium: 4.6 mmol/L (ref 3.5–5.1)
Sodium: 137 mmol/L (ref 135–145)
Total Bilirubin: 0.7 mg/dL (ref 0.3–1.2)
Total Protein: 7.8 g/dL (ref 6.5–8.1)

## 2020-02-26 LAB — TSH: TSH: 3.775 u[IU]/mL (ref 0.350–4.500)

## 2020-02-26 LAB — LACTATE DEHYDROGENASE: LDH: 106 U/L (ref 98–192)

## 2020-02-26 MED ORDER — SODIUM CHLORIDE 0.9 % IV SOLN
Freq: Once | INTRAVENOUS | Status: AC
  Filled 2020-02-26: qty 250

## 2020-02-26 MED ORDER — HEPARIN SOD (PORK) LOCK FLUSH 100 UNIT/ML IV SOLN
INTRAVENOUS | Status: AC
  Filled 2020-02-26: qty 5

## 2020-02-26 MED ORDER — SODIUM CHLORIDE 0.9% FLUSH
10.0000 mL | Freq: Once | INTRAVENOUS | Status: DC
  Filled 2020-02-26: qty 10

## 2020-02-26 MED ORDER — ONDANSETRON HCL 4 MG PO TABS: 4.0000 mg | ORAL_TABLET | Freq: Three times a day (TID) | ORAL | 1 refills | Status: DC | PRN

## 2020-02-26 MED ORDER — SODIUM CHLORIDE 0.9 % IV SOLN
240.0000 mg | Freq: Once | INTRAVENOUS | Status: AC
  Administered 2020-02-26: 240 mg via INTRAVENOUS
  Filled 2020-02-26: qty 24

## 2020-02-26 MED ORDER — HEPARIN SOD (PORK) LOCK FLUSH 100 UNIT/ML IV SOLN
500.0000 [IU] | Freq: Once | INTRAVENOUS | Status: AC
  Administered 2020-02-26: 500 [IU] via INTRAVENOUS
  Filled 2020-02-26: qty 5

## 2020-02-26 NOTE — Progress Notes (Signed)
Patient tolerated infusion well, no concerns voiced. All questions answered. Patient discharged. Stable.

## 2020-02-26 NOTE — Progress Notes (Signed)
Patient denies new problems/concerns today.   °

## 2020-02-26 NOTE — Progress Notes (Signed)
Hematology/Oncology follow up  note [facility] [phone] [phone]   Patient Care Team: [name], [name], MD as PCP - General (Student)  REFERRING PROVIDER: Cherylann Parr, MD  CHIEF COMPLAINTS/REASON FOR VISIT:  Follow up for melanoma HISTORY OF PRESENTING ILLNESS:   Edward Pitts is a  59 y.o.  male with PMH listed below was seen in consultation at the request of  [name], [name], MD  for evaluation of inguinal mass Patient presented to emergency room 3 days ago complaining about left ing uinal mass discomfort. Reports that he has really noticed the mass growing for the past 1 months. He has a history of left lower extremity melanoma in 2011, status post local excision.  Pain was increased with squatting of laxation. He was advised to take Tylenol for pain. Denies any fever, chills, night sweating.  He does feel mild nauseated. Appetite is fair.  He has lost about 10 pounds since earlier this year. 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  Left inguinal and left iliac nodes which are suspicious for involvement.  There are also 2 small nonspecific hypodense lesions within the right liver, nonspecific.  # patient underwent left groin mass resection On 04/16/2019. Resection pathology showed malignant melanoma, replacing a lymph node, with extracapsular extension, peripheral and deep margins involved.  Left inguinal contents, all 7 lymph nodes were negative for melanoma in the lymph nodes.  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  # PDL1 80% TPS  # 04/16/2019. underwent left groin mass resection   07/07/2019  Status post adjuvant radiation and finished radiation  Patient has Mediport placed to facilitate immunotherapy treatments. INTERVAL HISTORY Edward Pitts is a 59 y.o. male who has above history  reviewed by me today presents for follow up visit for management of inguinal nodal recurrence of melanoma Problems and complaints are listed below: Patient reports doing well.  He has no new complaints. Continues to have left knee and hip pain.  He Has establish care with orthopedic surgeon.  Had cortisone injection to his knee which he does not feel any symptom relief.  There is plan for cortisone injection to his hip. Review of Systems  Constitutional: Negative for appetite change, chills, fatigue, fever and unexpected weight change.  HENT:   Negative for hearing loss and voice change.   Eyes: Negative for eye problems and icterus.  Respiratory: Negative for chest tightness, cough and shortness of breath.   Cardiovascular: Negative for chest pain and leg swelling.  Gastrointestinal: Negative for abdominal distention and abdominal pain.  Endocrine: Negative for hot flashes.  Genitourinary: Negative for difficulty urinating, dysuria and frequency.   Musculoskeletal: Positive for arthralgias.  Skin: Negative for itching and rash.       Skin hypo-pigmentation on upper extremities, no change  Neurological: Negative for light-headedness and numbness.  Hematological: Negative for adenopathy. Does not bruise/bleed easily.  Psychiatric/Behavioral: Negative for confusion.    MEDICAL HISTORY:  Past Medical History:  Diagnosis Date  . Anxiety   . Arthritis   . Complication of anesthesia   . Family history of adverse reaction to anesthesia    PONV mother  . Hyperlipidemia   . Hypertension   . Melanoma ([place]) 2012  . PONV (postoperative nausea and vomiting) 04/16/2019  . Presence of permanent cardiac pacemaker    [employer]  . Seizures ([place])  SURGICAL HISTORY: Past Surgical History:  Procedure Laterality Date  . KNEE SURGERY Left   . LEFT HEART CATH AND CORONARY ANGIOGRAPHY Left 06/29/2017   Procedure: LEFT HEART CATH AND CORONARY ANGIOGRAPHY;  Surgeon: [name], MD;   Location: [place] [facility];  Service: Cardiovascular;  Laterality: Left;  . LYMPH NODE DISSECTION Left 04/16/2019   Procedure: Left inguinal Lymph Node Dissection;  Surgeon: [name], MD;  Location: [place];  Service: General;  Laterality: Left;  Marland Kitchen MELANOMA EXCISION Left 04/16/2019   Procedure: MELANOMA EXCISION LEFT GROIN MASS;  Surgeon: [name], MD;  Location: [place];  Service: General;  Laterality: Left;  Marland Kitchen MELANOMA EXCISION WITH SENTINEL LYMPH NODE BIOPSY Left 2012   Left calf   . PACEMAKER INSERTION N/A 08/26/2018   Procedure: INSERTION PACEMAKER;  Surgeon: [name], MD;  Location: [facility];  Service: Cardiovascular;  Laterality: N/A;  . PORTA CATH INSERTION N/A 08/26/2019   Procedure: PORTA CATH INSERTION;  Surgeon: Katha Cabal, MD;  Location: [place] [facility];  Service: Cardiovascular;  Laterality: N/A;  . TEMPORARY PACEMAKER N/A 08/25/2018   Procedure: TEMPORARY PACEMAKER;  Surgeon: [name], MD;  Location: [place] [facility];  Service: Cardiovascular;  Laterality: N/A;    SOCIAL HISTORY: Social History   Socioeconomic History  . Marital status: Married    Spouse name: Vicente Males   . Number of children: 7  . Years of education: 70  . Highest education level: Not on file  Occupational History  . Occupation: [employer]   Tobacco Use  . Smoking status: Never Smoker  . Smokeless tobacco: Never Used  Vaping Use  . Vaping Use: Never used  Substance and Sexual Activity  . Alcohol use: No  . Drug use: No  . Sexual activity: Not on file  Other Topics Concern  . Not on file  Social History Narrative   Lives with mother, wife and sister   Caffeine use: sodas (2 per day)   Social Determinants of Health   Financial Resource Strain:   . Difficulty of Paying Living Expenses: Not on file  Food Insecurity:   . Worried About Charity fundraiser in the Last Year: Not on file  . Ran Out of Food in the Last Year: Not on file  Transportation Needs:   .  Lack of Transportation (Medical): Not on file  . Lack of Transportation (Non-Medical): Not on file  Physical Activity:   . Days of Exercise per Week: Not on file  . Minutes of Exercise per Session: Not on file  Stress:   . Feeling of Stress : Not on file  Social Connections:   . Frequency of Communication with Friends and Family: Not on file  . Frequency of Social Gatherings with Friends and Family: Not on file  . Attends Religious Services: Not on file  . Active Member of Clubs or Organizations: Not on file  . Attends Archivist Meetings: Not on file  . Marital Status: Not on file  Intimate Partner Violence:   . Fear of Current or Ex-Partner: Not on file  . Emotionally Abused: Not on file  . Physically Abused: Not on file  . Sexually Abused: Not on file    FAMILY HISTORY: Family History  Problem Relation Age of Onset  . Cancer Paternal Grandmother     ALLERGIES:  is allergic to ibuprofen and nsaids.  MEDICATIONS:  Current Outpatient Medications  Medication Sig Dispense Refill  . acetaminophen (TYLENOL) 650 MG  CR tablet Take 650 mg by mouth every 8 (eight) hours as needed for pain.     Marland Kitchen allopurinol (ZYLOPRIM) 300 MG tablet Take 300 mg by mouth daily.    Marland Kitchen aspirin 81 MG chewable tablet Chew 81 mg by mouth daily.    Marland Kitchen atorvastatin (LIPITOR) 20 MG tablet Take 20 mg by mouth daily.    . clonazePAM (KLONOPIN) 0.5 MG tablet 1 tablet in the morning, 2 in the evening 90 tablet 3  . diclofenac Sodium (VOLTAREN) 1 % GEL Apply 2 g topically 4 (four) times daily. 50 g 0  . hydrocortisone 2.5 % cream Apply topically daily as needed.    Marland Kitchen ketoconazole (NIZORAL) 2 % cream Apply 1 application topically daily as needed for irritation.    . lidocaine-prilocaine (EMLA) cream Apply 1 application topically as needed. Apply small amount of cream to port site approx 1-2 hours prior to appointment. 30 g 2  . lisinopril (ZESTRIL) 20 MG tablet Take 20 mg by mouth daily.    . metoprolol  succinate (TOPROL-XL) 25 MG 24 hr tablet Take 25 mg by mouth daily.     . Multiple Vitamin (MULTIVITAMIN WITH MINERALS) TABS tablet Take 1 tablet by mouth daily. Centrum Silver    . ondansetron (ZOFRAN) 4 MG tablet Take 1 tablet (4 mg total) by mouth every 8 (eight) hours as needed for nausea or vomiting. 90 tablet 1  . silver sulfADIAZINE (SILVADENE) 1 % cream Apply 1 application topically 2 (two) times daily. (Patient not taking: Reported on 02/05/2020) 50 g 2   No current facility-administered medications for this visit.   Facility-Administered Medications Ordered in Other Visits  Medication Dose Route Frequency Provider Last Rate Last Admin  . heparin lock flush 100 unit/mL  500 Units Intravenous Once [name], MD      . nivolumab (OPDIVO) 240 mg in sodium chloride 0.9 % 100 mL chemo infusion  240 mg Intravenous Once [name], MD      . sodium chloride flush (NS) 0.9 % injection 10 mL  10 mL Intravenous Once [name], MD         PHYSICAL EXAMINATION: ECOG PERFORMANCE STATUS: 1 - Symptomatic but completely ambulatory Vitals:   02/26/20 0853  BP: (!) 144/83  Pulse: 70  Resp: 18  Temp: (!) 96.6 F (35.9 C)   Filed Weights   02/26/20 0853  Weight: 233 lb 11.2 oz (106 kg)    Physical Exam Constitutional:      General: He is not in acute distress. HENT:     Head: Normocephalic and atraumatic.  Eyes:     General: No scleral icterus.    Pupils: Pupils are equal, round, and reactive to light.  Cardiovascular:     Rate and Rhythm: Normal rate and regular rhythm.     Heart sounds: Normal heart sounds.  Pulmonary:     Effort: Pulmonary effort is normal. No respiratory distress.     Breath sounds: No wheezing.  Abdominal:     General: Bowel sounds are normal. There is no distension.     Palpations: Abdomen is soft. There is no mass.     Tenderness: There is no abdominal tenderness.     Comments:  History left groin mass resection and radiation.   Musculoskeletal:         General: No deformity. Normal range of motion.     Cervical back: Normal range of motion and neck supple.     Comments: Left lower extremity edema  Skin:    General: Skin is warm and dry.     Findings: No erythema or rash.  Neurological:     Mental Status: He is alert and oriented to person, place, and time. Mental status is at baseline.     Cranial Nerves: No cranial nerve deficit.     Coordination: Coordination normal.  Psychiatric:        Mood and Affect: Mood normal.    LABORATORY DATA:  I have reviewed the data as listed Lab Results  Component Value Date   WBC 5.0 02/26/2020   HGB 12.4 (L) 02/26/2020   HCT 37.1 (L) 02/26/2020   MCV 89.8 02/26/2020   PLT 189 02/26/2020   Recent Labs    11/27/19 0833 11/27/19 0833 12/11/19 0854 12/11/19 0854 12/25/19 0905 01/08/20 0851 01/22/20 0900 02/05/20 0848 02/26/20 0831  NA 140   < > 139   < > 140   < > 140 139 137  K 4.1   < > 3.8   < > 4.4   < > 4.1 4.5 4.6  CL 106   < > 104   < > 106   < > 108 107 104  CO2 24   < > 27   < > 27   < > _0 GLUCOSE 125*   < > 118*   < > 109*   < > 129* 117* 123*  BUN 20   < > 25*   < > 20   < > 25* 37* 36*  CREATININE 1.10   < > 1.16   < > 1.22   < > 1.14 1.16 1.12  CALCIUM 9.0   < > 9.1   < > 9.1   < > 9.1 9.4 9.5  GFRNONAA >60   < > >60   < > >60   < > >60 >60 >60  GFRAA >60  --  >60  --  >60  --   --   --   --   PROT 7.1   < > 7.3   < > 7.0   < > 7.2 7.5 7.8  ALBUMIN 4.0   < > 4.2   < > 4.1   < > 4.3 4.3 4.2  AST 29   < > 28   < > 26   < > _1 ALT 40   < > 39   < > 32   < > 32 37 34  ALKPHOS 89   < > 80   < > 80   < > 83 81 84  BILITOT 0.6   < > 0.8   < > 0.9   < > 0.7 0.6 0.7   < > = values in this interval not displayed.   Iron/TIBC/Ferritin/ %Sat    Component Value Date/Time   IRON 85 10/02/2019 0841   TIBC 342 10/02/2019 0841   FERRITIN 61 10/02/2019 0841   IRONPCTSAT 25 10/02/2019 0841      RADIOGRAPHIC STUDIES: I have personally reviewed the radiological  images as listed and agreed with the findings in the report. CT CHEST ABDOMEN PELVIS W CONTRAST  Result Date: 12/10/2019 CLINICAL DATA:  Malignant melanoma. LEFT calf excision 2012. Recurrence to LEFT inguinal lymph node January 21. EXAM: CT CHEST, ABDOMEN, AND PELVIS WITH CONTRAST TECHNIQUE: Multidetector CT imaging of the chest, abdomen and pelvis was performed following the standard protocol during bolus administration of intravenous contrast. CONTRAST:  152m OMNIPAQUE IOHEXOL 300  MG/ML  SOLN COMPARISON:  CT 09/10/2019, PET-CT 02/11/2019 FINDINGS: CT CHEST FINDINGS Cardiovascular: Port in the anterior chest wall with tip in distal SVC. LEFT-sided pacemaker noted. No significant vascular findings. Normal heart size. No pericardial effusion. Mediastinum/Nodes: No axillary or supraclavicular adenopathy. No mediastinal or hilar adenopathy. No pericardial fluid. Esophagus normal. Lungs/Pleura: No suspicious pulmonary nodules. Musculoskeletal: No aggressive osseous lesion. CT ABDOMEN AND PELVIS FINDINGS Hepatobiliary: Hypodense lesion in the RIGHT hepatic lobe measuring 7 mm unchanged. Similar smaller lesion on image 65/2 is also unchanged. Normal gallbladder. Pancreas: Pancreas is normal. No ductal dilatation. No pancreatic inflammation. Spleen: Normal spleen Adrenals/urinary tract: Adrenal glands and kidneys are normal. The ureters and bladder normal. Stomach/Bowel: Stomach, small-bowel and cecum are normal. The appendix is not identified but there is no pericecal inflammation to suggest appendicitis. The colon and rectosigmoid colon are normal. Vascular/Lymphatic: Abdominal aorta is normal caliber. There is no retroperitoneal or periportal lymphadenopathy. No pelvic lymphadenopathy. 6 mm gastrohepatic ligament lymph node (image 58/2) is unchanged Reproductive: Unremarkable Other: Postsurgical change in LEFT groin consistent prior groin mass resection. No evidence of local recurrence. Musculoskeletal: No  aggressive osseous lesion. IMPRESSION: Chest Impression: No evidence of thoracic metastasis Abdomen / Pelvis Impression: 1. Post LEFT groin mass resection.  No evidence local recurrence. 2. No evidence of metastatic disease the abdomen pelvis. 3. No evidence skeletal metastasis. 4. Stable small hypodensities in the RIGHT hepatic lobe. Electronically Signed   By: Suzy Bouchard M.D.   On: 12/10/2019 11:13   DG HIP UNILAT WITH PELVIS 2-3 VIEWS LEFT  Result Date: 01/24/2020 CLINICAL DATA:  Left hip pain and stiffness, history of melanoma EXAM: DG HIP (WITH OR WITHOUT PELVIS) 2-3V LEFT COMPARISON:  None. FINDINGS: Frontal view of the pelvis as well as frontal and frogleg lateral views of the left hip are obtained. No fracture, subluxation, or dislocation. There is moderate left hip osteoarthritis with superior joint space narrowing and osteophyte formation. Sacroiliac joints are normal. Prominent facet hypertrophic changes at the lumbosacral junction. IMPRESSION: 1. Moderate left hip osteoarthritis.  No acute fracture. 2. Lower lumbar facet hypertrophy. Electronically Signed   By: Randa Ngo M.D.   On: 01/24/2020 11:50       ASSESSMENT & PLAN:  1. Encounter for antineoplastic immunotherapy   2. Malignant melanoma of overlapping sites ([place])   3. Arthritis of left knee   4. Vitiligo    #Left inguinal nodal recurrence of melanoma.  Status post resection and adjuvant radiation. Labs are reviewed and discussed with patient Counts acceptable to proceed with today's nivolumab treatment. Discussed about CT in December or January.  Patient prefers to get CT early January.  We will schedule.  # Left hip and knee arthritis arthritis  Patient has been seen by orthopedic surgeon.  Continue to follow also recommendation.  #Vitiligo, stable #Normocytic anemia, stable.  Mild All questions were answered. The patient knows to call the clinic with any problems questions or concerns.  Follow-up with 2  weeks. We spent sufficient time to discuss many aspect of care, questions were answered to patient's satisfaction.   [name], MD, PhD Hematology Oncology [facility] Pager- [phone] 02/26/2020

## 2020-03-04 ENCOUNTER — Ambulatory Visit: Attending: Oncology | Admitting: Occupational Therapy

## 2020-03-04 ENCOUNTER — Other Ambulatory Visit: Payer: Self-pay

## 2020-03-04 DIAGNOSIS — I890 Lymphedema, not elsewhere classified: Secondary | ICD-10-CM | POA: Diagnosis not present

## 2020-03-04 NOTE — Therapy (Signed)
[place] [facility] MAIN [facility] SERVICES [address] [place], [place], [address] Phone: [phone]   Fax:  [phone]  Occupational Therapy Treatment and Discharge Summary: Lymphedema Care  Patient Details  Name: Edward Pitts MRN: [id number] Date of Birth: 01-27-1961 Referring Provider (OT): Rickard Patience, MD   Encounter Date: 03/04/2020   OT End of Session - 03/04/20 1249    Visit Number 42    Number of Visits 72    OT Start Time 1115    OT Stop Time 1215    OT Time Calculation (min) 60 min           Past Medical History:  Diagnosis Date  . Anxiety   . Arthritis   . Complication of anesthesia   . Family history of adverse reaction to anesthesia    PONV mother  . Hyperlipidemia   . Hypertension   . Melanoma (HCC) 2012  . PONV (postoperative nausea and vomiting) 04/16/2019  . Presence of permanent cardiac pacemaker    Medtronic  . Seizures (HCC)     Past Surgical History:  Procedure Laterality Date  . KNEE SURGERY Left   . LEFT HEART CATH AND CORONARY ANGIOGRAPHY Left 06/29/2017   Procedure: LEFT HEART CATH AND CORONARY ANGIOGRAPHY;  Surgeon: Lamar Blinks, MD;  Location: [facility] INVASIVE CV LAB;  Service: Cardiovascular;  Laterality: Left;  . LYMPH NODE DISSECTION Left 04/16/2019   Procedure: Left inguinal Lymph Node Dissection;  Surgeon: Almond Lint, MD;  Location: [facility] OR;  Service: General;  Laterality: Left;  Marland Kitchen MELANOMA EXCISION Left 04/16/2019   Procedure: MELANOMA EXCISION LEFT GROIN MASS;  Surgeon: Almond Lint, MD;  Location: [facility] OR;  Service: General;  Laterality: Left;  Marland Kitchen MELANOMA EXCISION WITH SENTINEL LYMPH NODE BIOPSY Left 2012   Left calf   . PACEMAKER INSERTION N/A 08/26/2018   Procedure: INSERTION PACEMAKER;  Surgeon: Marcina Millard, MD;  Location: [facility] ORS;  Service: Cardiovascular;  Laterality: N/A;  . PORTA CATH INSERTION N/A 08/26/2019   Procedure: PORTA CATH INSERTION;  Surgeon: Renford Dills, MD;  Location: [facility]  INVASIVE CV LAB;  Service: Cardiovascular;  Laterality: N/A;  . TEMPORARY PACEMAKER N/A 08/25/2018   Procedure: TEMPORARY PACEMAKER;  Surgeon: Tonny Bollman, MD;  Location: [facility] INVASIVE CV LAB;  Service: Cardiovascular;  Laterality: N/A;    There were no vitals filed for this visit.   Subjective Assessment - 03/04/20 1132    Subjective  [name] returns for follow along care for LLE cancer -related lymphedema. He was last seen for OT on 01/13/20. Pt reports his leg swelling has been doing good. He's returned to the  gym and enjoys working out regularly with his wife. [name] reports he is considering THA if PT and injections in the hip are not effective for arthritis pain and increasing difficulty walking. Pt has no new LE-related complaints today. Hip and knee pain are unchanged.    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 w/ LN disection (-7/7 LN); Completed adjuvant XRT 07/07/2019; 08/2018 Pacemaker placed; L knee sx date?;    Limitations chronic LLE knee pain; post surgical LLE pain,  chronic LLE/LLQ swelling, decreased LLE strength, impaired gait, decreased dynamic balance, altered sensation LLE, increased risk of infection, decreased standing and walking tolerance > 1 hr; decreased ankle AROM    Repetition Increases Symptoms    Special Tests +Stemmer base L toes    Patient Stated Goals reduce swelling to  normal and keep it from getting worse    Pain Location Hip    Pain Orientation Left    Pain Type Chronic pain    Pain Onset More than a month ago               LYMPHEDEMA/ONCOLOGY QUESTIONNAIRE - 03/04/20 0001      Left Lower Extremity Lymphedema   Other LLE A-D volume =3278.5 ml. This value reveals a 12.9 % limb volume decreaase  from ankle to tibial tuberosity since last measured on 01/07/20, and an overall decrease in volume measuring 27% since commencing OT for CDT on 07/28/19. 21. LLE thigh volume (E-G) = 7510.4 ml  today. . This value reveals a 5.7% volume INCREASE in the L thigh since 10/13, and an overall thigh volume increase measuring *% since 5/3. Palpable fluid based swelling in the thigh is minimal . I suspect this increase in volume is largely due to increased muscle mass. OA in hip may also be contributing to some portion of volume increase in the thigh, but  by palpation the thigh is muscular and without fibrosis. Marland Kitchen  LLE A-G (ankle to groin ) volume measures 10788.9 ml today. This value reveals full limb volume decrease of 5.7% overall since commencing OT for CDT.                   OT Treatments/Exercises (OP) - 03/04/20 0001      ADLs   ADL Education Given Yes      Manual Therapy   Manual Therapy Edema management    Manual therapy comments LLE comparative limb volumetrics    Compression Bandaging Max A to don custom thigh length compression stocking using AE                  OT Education - 03/04/20 1236    Education Details Reviewed progress towards all goals. Reviewed home program. Reviewed garment replacement schedule.    Person(s) Educated Patient    Methods Explanation;Demonstration;Handout    Comprehension Verbalized understanding;Returned demonstration               OT Long Term Goals - 03/04/20 1257      OT LONG TERM GOAL #1   Title Pt will be able to apply thigh length, multi-layer, short stretch compression wraps daily to single limb using correct gradient techniques with Maximum assistance to achieve optimal limb volume reduction, to return affected limb/s, as closely as possible, to premorbid size and shape, to limit infection risk, and to improve safe functional ambulation and mobility.    Status Achieved   Pt unable to bend left knee to extent needed to appy compression garments independently. Cargiver is independent with gradient techniques.     OT LONG TERM GOAL #2   Title Pt will be able to verbalize signs and symptoms of cellulitis infection and  identify at least 4 common lymphedema precautions using printed resource for reference to limit LE progression over time to limit risk of infection and LE exacerbation.    Status Achieved      OT LONG TERM GOAL #3   Status Achieved      OT LONG TERM GOAL #4   Status Achieved   achieved this date.. Goal met for LLE     OT LONG TERM GOAL #5   Status Achieved      OT LONG TERM GOAL #6   Status Not Met   Pt mod A with donning and doffing with spouse's  assistance                Plan - 03/04/20 1250    Clinical Impression Statement Completed LLE comparative limb volumetric today. LLE (Rx limb)  A-D volume =3278.5 ml. This value reveals a 12.9 % limb volume decreaase  from ankle to tibial tuberosity since last measured on 01/07/20, and an overall decrease in volume measuring 27% since commencing OT for CDT on 07/28/19. This value meets and exceeds initial limb volume reduction goal. LLE thigh volume (E-G) = 7510.4 ml today. This value reveals a 5.7% volume INCREASE in the L thigh since 10/13, and an overall thigh volume increase measuring 8 % since 5/3. Palpable swelling in the thigh absent except in region of excision scar.  I suspect this increase in volume is largely due to increased muscle mass. OA in hip may also be contributing to some portion of volume increase in the thigh. By palpation the thigh is muscular and without fibrosis at the level of the skin. LLE A-G (ankle to groin ) volume measures 10788.9 ml today. This value reveals full limb volume decrease of 5.7% overall since commencing OT for CDT.Pt has met all OT treatment goals for lymphedema care and he is compliant with home program. Mr Cabreja is encouraged to come back in for assistance with custom garment measurements in ~ 1 year when existing garments are wearing out. Mr Lovering will call w questions and/ or concerns as needed. It has been my pleasure working with Mr Liou and assisting him with lymphedema management. Trevone Prestwood is  DC from OT today.    OT Occupational Profile and History Comprehensive Assessment- Review of records and extensive additional review of physical, cognitive, psychosocial history related to current functional performance    Occupational performance deficits (Please refer to evaluation for details): ADL's;IADL's;Rest and Sleep;Work;Leisure;Social Participation;Other   body image, role performance   Body Structure / Function / Physical Skills ADL;ROM;Scar mobility;IADL;Edema;Balance;Sensation;Skin integrity;Mobility;Flexibility;Strength;Decreased knowledge of precautions;Gait;Pain;Decreased knowledge of use of DME    Rehab Potential Good    Clinical Decision Making Several treatment options, min-mod task modification necessary    Comorbidities Affecting Occupational Performance: Presence of comorbidities impacting occupational performance    Modification or Assistance to Complete Evaluation  Min-Moderate modification of tasks or assist with assess necessary to complete eval    OT Frequency 2x / week    OT Duration 12 weeks   and PRN   OT Treatment/Interventions Self-care/ADL training;Therapeutic exercise;Functional Mobility Training;Manual Therapy;Coping strategies training;Therapeutic activities;Manual lymph drainage;Energy conservation;DME and/or AE instruction;Compression bandaging;Other (comment);Scar mobilization;Patient/family education   fit with appropriate compression garments once swelling is reduced   Plan Complete Decongestive Therapy (CDT), Intensive and Management Phases to include  manual lymphatyic drainage (MLD), skin care, therapeutic exercise, compression with short stretch wraps and garments, Pt edu for LE sef care    Consulted and Agree with Plan of Care Patient           Patient will benefit from skilled therapeutic intervention in order to improve the following deficits and impairments:   Body Structure / Function / Physical Skills: ADL,ROM,Scar  mobility,IADL,Edema,Balance,Sensation,Skin integrity,Mobility,Flexibility,Strength,Decreased knowledge of precautions,Gait,Pain,Decreased knowledge of use of DME       Visit Diagnosis: Lymphedema, not elsewhere classified    Problem List Patient Active Problem List   Diagnosis Date Noted  . Dizziness 12/22/2019  . Arthritis of left knee 11/27/2019  . Vitiligo 09/03/2019  . Port-A-Cath in place 08/20/2019  . Encounter for antineoplastic immunotherapy 08/06/2019  . Malignant  melanoma of overlapping sites ([place]) 04/23/2019  . Goals of care, counseling/discussion 04/23/2019  . 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 Near syncope 12/17/2018  . Anemia in chronic kidney disease 12/11/2018  . Benign hypertensive kidney disease with chronic kidney disease 12/11/2018  . Stage 3a chronic kidney disease ([place]) 12/11/2018  . AKI (acute kidney injury) ([place]) 08/24/2018  . Acute hyperkalemia 08/24/2018  . HTN (hypertension) 08/24/2018  . HLD (hyperlipidemia) 08/24/2018  . Seizures ([place]) 08/24/2018  . Complete heart block ([place]) 08/24/2018  . Arthritis of wrist, left 06/12/2018  . Bradycardia 06/04/2018  . 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 Positive ANA (antinuclear antibody) 03/05/2018  . Swelling of joint of left wrist 03/05/2018  . Coronary artery disease involving native coronary artery of native heart 07/09/2017  . Palpitations 07/09/2017  . Stable angina ([place]) 06/27/2017  . Benign essential HTN 05/25/2017  . LBBB (left bundle branch block) 05/25/2017    Andrey Spearman, MS, OTR/L, [facility] 03/04/20 12:58 PM   [place] MAIN [facility] SERVICES [address] [place], [place], [address] Phone: [phone]   Fax:  [phone]  Name: Syre Knerr MRN: [id number] Date of Birth: March 04, 1961

## 2020-03-11 ENCOUNTER — Inpatient Hospital Stay

## 2020-03-11 ENCOUNTER — Inpatient Hospital Stay (HOSPITAL_BASED_OUTPATIENT_CLINIC_OR_DEPARTMENT_OTHER): Admitting: Oncology

## 2020-03-11 ENCOUNTER — Encounter: Payer: Self-pay | Admitting: Oncology

## 2020-03-11 VITALS — BP 137/80 | HR 84 | Temp 96.8°F | Resp 16 | Wt 236.4 lb

## 2020-03-11 DIAGNOSIS — M2550 Pain in unspecified joint: Secondary | ICD-10-CM

## 2020-03-11 DIAGNOSIS — C438 Malignant melanoma of overlapping sites of skin: Secondary | ICD-10-CM | POA: Diagnosis not present

## 2020-03-11 DIAGNOSIS — L80 Vitiligo: Secondary | ICD-10-CM | POA: Diagnosis not present

## 2020-03-11 DIAGNOSIS — Z5112 Encounter for antineoplastic immunotherapy: Secondary | ICD-10-CM | POA: Diagnosis not present

## 2020-03-11 LAB — CBC WITH DIFFERENTIAL/PLATELET
Abs Immature Granulocytes: 0.07 10*3/uL (ref 0.00–0.07)
Basophils Absolute: 0 10*3/uL (ref 0.0–0.1)
Basophils Relative: 1 %
Eosinophils Absolute: 0.1 10*3/uL (ref 0.0–0.5)
Eosinophils Relative: 2 %
HCT: 37.2 % — ABNORMAL LOW (ref 39.0–52.0)
Hemoglobin: 12.5 g/dL — ABNORMAL LOW (ref 13.0–17.0)
Immature Granulocytes: 1 %
Lymphocytes Relative: 20 %
Lymphs Abs: 1.1 10*3/uL (ref 0.7–4.0)
MCH: 30 pg (ref 26.0–34.0)
MCHC: 33.6 g/dL (ref 30.0–36.0)
MCV: 89.4 fL (ref 80.0–100.0)
Monocytes Absolute: 0.6 10*3/uL (ref 0.1–1.0)
Monocytes Relative: 10 %
Neutro Abs: 3.5 10*3/uL (ref 1.7–7.7)
Neutrophils Relative %: 66 %
Platelets: 213 10*3/uL (ref 150–400)
RBC: 4.16 MIL/uL — ABNORMAL LOW (ref 4.22–5.81)
RDW: 13.1 % (ref 11.5–15.5)
WBC: 5.4 10*3/uL (ref 4.0–10.5)
nRBC: 0 % (ref 0.0–0.2)

## 2020-03-11 LAB — COMPREHENSIVE METABOLIC PANEL
ALT: 35 U/L (ref 0–44)
AST: 25 U/L (ref 15–41)
Albumin: 4 g/dL (ref 3.5–5.0)
Alkaline Phosphatase: 88 U/L (ref 38–126)
Anion gap: 11 (ref 5–15)
BUN: 30 mg/dL — ABNORMAL HIGH (ref 6–20)
CO2: 21 mmol/L — ABNORMAL LOW (ref 22–32)
Calcium: 9.2 mg/dL (ref 8.9–10.3)
Chloride: 100 mmol/L (ref 98–111)
Creatinine, Ser: 1.22 mg/dL (ref 0.61–1.24)
GFR, Estimated: >60 mL/min (ref >60)
Glucose, Bld: 112 mg/dL — ABNORMAL HIGH (ref 70–99)
Potassium: 4.9 mmol/L (ref 3.5–5.1)
Sodium: 132 mmol/L — ABNORMAL LOW (ref 135–145)
Total Bilirubin: 0.6 mg/dL (ref 0.3–1.2)
Total Protein: 7.4 g/dL (ref 6.5–8.1)

## 2020-03-11 MED ORDER — HEPARIN SOD (PORK) LOCK FLUSH 100 UNIT/ML IV SOLN
500.0000 [IU] | Freq: Once | INTRAVENOUS | Status: AC | PRN
  Administered 2020-03-11: 11:00:00 500 [IU]
  Filled 2020-03-11: qty 5

## 2020-03-11 MED ORDER — SODIUM CHLORIDE 0.9 % IV SOLN
Freq: Once | INTRAVENOUS | Status: AC
  Filled 2020-03-11: qty 250

## 2020-03-11 MED ORDER — SODIUM CHLORIDE 0.9 % IV SOLN
240.0000 mg | Freq: Once | INTRAVENOUS | Status: AC
  Administered 2020-03-11: 10:00:00 240 mg via INTRAVENOUS
  Filled 2020-03-11: qty 24

## 2020-03-11 MED ORDER — HEPARIN SOD (PORK) LOCK FLUSH 100 UNIT/ML IV SOLN
INTRAVENOUS | Status: AC
  Filled 2020-03-11: qty 5

## 2020-03-11 MED ORDER — SODIUM CHLORIDE 0.9% FLUSH
10.0000 mL | INTRAVENOUS | Status: DC | PRN
  Administered 2020-03-11: 08:00:00 10 mL via INTRAVENOUS
  Filled 2020-03-11: qty 10

## 2020-03-11 MED ORDER — HEPARIN SOD (PORK) LOCK FLUSH 100 UNIT/ML IV SOLN
500.0000 [IU] | Freq: Once | INTRAVENOUS | Status: AC
  Filled 2020-03-11: qty 5

## 2020-03-11 NOTE — Progress Notes (Signed)
Pt tolerated infusion well with no signs of complications. VSS. Pt stable for discharge.   Edward Pitts  

## 2020-03-11 NOTE — Progress Notes (Signed)
Patient's left hip and knee pain is 6-7/10 today.  Does take the nausea medication daily to prevent nausea.

## 2020-03-11 NOTE — Progress Notes (Signed)
Hematology/Oncology follow up  note [facility] Telephone:([phone] Fax:([phone]   Patient Care Team: [name], [name], MD as PCP - General (Student)  REFERRING PROVIDER: Cherylann Parr, MD  CHIEF COMPLAINTS/REASON FOR VISIT:  Follow up for melanoma HISTORY OF PRESENTING ILLNESS:   Edward Pitts is a  59 y.o.  male with PMH listed below was seen in consultation at the request of  [name], [name], MD  for evaluation of inguinal mass Patient presented to emergency room 3 days ago complaining about left ing uinal mass discomfort. Reports that he has really noticed the mass growing for the past 1 months. He has a history of left lower extremity melanoma in 2011, status post local excision.  Pain was increased with squatting of laxation. He was advised to take Tylenol for pain. Denies any fever, chills, night sweating.  He does feel mild nauseated. Appetite is fair.  He has lost about 10 pounds since earlier this year. 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  Left inguinal and left iliac nodes which are suspicious for involvement.  There are also 2 small nonspecific hypodense lesions within the right liver, nonspecific.  # patient underwent left groin mass resection On 04/16/2019. Resection pathology showed malignant melanoma, replacing a lymph node, with extracapsular extension, peripheral and deep margins involved.  Left inguinal contents, all 7 lymph nodes were negative for melanoma in the lymph nodes.  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  # PDL1 80% TPS  # 04/16/2019. underwent left groin mass resection   07/07/2019  Status post adjuvant radiation and finished radiation  Patient has Mediport placed to facilitate immunotherapy treatments. INTERVAL HISTORY Edward Pitts is a 59 y.o. male who has above history  reviewed by me today presents for follow up visit for management of inguinal nodal recurrence of melanoma Problems and complaints are listed below No new complaints. Left knee and hip pain. He sees orthopedic surgeon.   :Review of Systems  Constitutional: Negative for appetite change, chills, fatigue, fever and unexpected weight change.  HENT:   Negative for hearing loss and voice change.   Eyes: Negative for eye problems and icterus.  Respiratory: Negative for chest tightness, cough and shortness of breath.   Cardiovascular: Negative for chest pain and leg swelling.  Gastrointestinal: Negative for abdominal distention and abdominal pain.  Endocrine: Negative for hot flashes.  Genitourinary: Negative for difficulty urinating, dysuria and frequency.   Musculoskeletal: Positive for arthralgias.  Skin: Negative for itching and rash.       Skin hypo-pigmentation on upper extremities, no change  Neurological: Negative for light-headedness and numbness.  Hematological: Negative for adenopathy. Does not bruise/bleed easily.  Psychiatric/Behavioral: Negative for confusion.    MEDICAL HISTORY:  Past Medical History:  Diagnosis Date  . Anxiety   . Arthritis   . Complication of anesthesia   . Family history of adverse reaction to anesthesia    PONV mother  . Hyperlipidemia   . Hypertension   . Melanoma ([place]) 2012  . PONV (postoperative nausea and vomiting) 04/16/2019  . Presence of permanent cardiac pacemaker    Medtronic  . Seizures ([place])     SURGICAL HISTORY: Past Surgical History:  Procedure Laterality Date  . KNEE SURGERY Left   . LEFT HEART CATH AND CORONARY ANGIOGRAPHY Left 06/29/2017   Procedure: LEFT HEART CATH AND CORONARY ANGIOGRAPHY;  Surgeon:  [name], MD;  Location: [place] CV LAB;  Service: Cardiovascular;  Laterality: Left;  . LYMPH NODE DISSECTION Left 04/16/2019   Procedure: Left inguinal Lymph Node Dissection;  Surgeon: [name], MD;  Location: [place];  Service: General;  Laterality: Left;  Marland Kitchen MELANOMA EXCISION Left 04/16/2019   Procedure: MELANOMA EXCISION LEFT GROIN MASS;  Surgeon: [name], MD;  Location: [place];  Service: General;  Laterality: Left;  Marland Kitchen MELANOMA EXCISION WITH SENTINEL LYMPH NODE BIOPSY Left 2012   Left calf   . PACEMAKER INSERTION N/A 08/26/2018   Procedure: INSERTION PACEMAKER;  Surgeon: [name], MD;  Location: [facility] ORS;  Service: Cardiovascular;  Laterality: N/A;  . PORTA CATH INSERTION N/A 08/26/2019   Procedure: PORTA CATH INSERTION;  Surgeon: Katha Cabal, MD;  Location: [place] CV LAB;  Service: Cardiovascular;  Laterality: N/A;  . TEMPORARY PACEMAKER N/A 08/25/2018   Procedure: TEMPORARY PACEMAKER;  Surgeon: [name], MD;  Location: [place] CV LAB;  Service: Cardiovascular;  Laterality: N/A;    SOCIAL HISTORY: Social History   Socioeconomic History  . Marital status: Married    Spouse name: Vicente Males   . Number of children: 7  . Years of education: 58  . Highest education level: Not on file  Occupational History  . Occupation: [employer]   Tobacco Use  . Smoking status: Never Smoker  . Smokeless tobacco: Never Used  Vaping Use  . Vaping Use: Never used  Substance and Sexual Activity  . Alcohol use: No  . Drug use: No  . Sexual activity: Not on file  Other Topics Concern  . Not on file  Social History Narrative   Lives with mother, wife and sister   Caffeine use: sodas (2 per day)   Social Determinants of Health   Financial Resource Strain: Not on file  Food Insecurity: Not on file  Transportation Needs: Not on file  Physical Activity: Not on file  Stress: Not on file  Social Connections: Not on file  Intimate Partner Violence: Not on file    FAMILY HISTORY: Family History  Problem Relation Age of Onset  . Cancer Paternal Grandmother     ALLERGIES:  is allergic to ibuprofen and nsaids.  MEDICATIONS:  Current Outpatient Medications  Medication Sig  Dispense Refill  . acetaminophen (TYLENOL) 650 MG CR tablet Take 650 mg by mouth every 8 (eight) hours as needed for pain.     Marland Kitchen allopurinol (ZYLOPRIM) 300 MG tablet Take 300 mg by mouth daily.    Marland Kitchen aspirin 81 MG chewable tablet Chew 81 mg by mouth daily.    Marland Kitchen atorvastatin (LIPITOR) 20 MG tablet Take 20 mg by mouth daily.    . clonazePAM (KLONOPIN) 0.5 MG tablet 1 tablet in the morning, 2 in the evening 90 tablet 3  . diclofenac Sodium (VOLTAREN) 1 % GEL Apply 2 g topically 4 (four) times daily. 50 g 0  . hydrocortisone 2.5 % cream Apply topically daily as needed.    Marland Kitchen ketoconazole (NIZORAL) 2 % cream Apply 1 application topically daily as needed for irritation.    . lidocaine-prilocaine (EMLA) cream Apply 1 application topically as needed. Apply small amount of cream to port site approx 1-2 hours prior to appointment. 30 g 2  . lisinopril (ZESTRIL) 20 MG tablet Take 20 mg by mouth daily.    . metoprolol succinate (TOPROL-XL) 25 MG 24 hr tablet Take 25 mg by mouth daily.     . Multiple Vitamin (MULTIVITAMIN  WITH MINERALS) TABS tablet Take 1 tablet by mouth daily. Centrum Silver    . ondansetron (ZOFRAN) 4 MG tablet Take 1 tablet (4 mg total) by mouth every 8 (eight) hours as needed for nausea or vomiting. 90 tablet 1  . silver sulfADIAZINE (SILVADENE) 1 % cream Apply 1 application topically 2 (two) times daily. (Patient not taking: No sig reported) 50 g 2   No current facility-administered medications for this visit.   Facility-Administered Medications Ordered in Other Visits  Medication Dose Route Frequency Provider Last Rate Last Admin  . sodium chloride flush (NS) 0.9 % injection 10 mL  10 mL Intravenous PRN [name], MD   10 mL at 03/11/20 0818     PHYSICAL EXAMINATION: ECOG PERFORMANCE STATUS: 1 - Symptomatic but completely ambulatory Vitals:   03/11/20 0842  BP: 137/80  Pulse: 84  Resp: 16  Temp: (!) 96.8 F (36 C)  SpO2: 96%   Filed Weights   03/11/20 0842  Weight: 236 lb  6.4 oz (107.2 kg)    Physical Exam Constitutional:      General: He is not in acute distress. HENT:     Head: Normocephalic and atraumatic.  Eyes:     General: No scleral icterus.    Pupils: Pupils are equal, round, and reactive to light.  Cardiovascular:     Rate and Rhythm: Normal rate and regular rhythm.     Heart sounds: Normal heart sounds.  Pulmonary:     Effort: Pulmonary effort is normal. No respiratory distress.     Breath sounds: No wheezing.  Abdominal:     General: Bowel sounds are normal. There is no distension.     Palpations: Abdomen is soft. There is no mass.     Tenderness: There is no abdominal tenderness.     Comments:  History left groin mass resection and radiation.   Musculoskeletal:        General: No deformity. Normal range of motion.     Cervical back: Normal range of motion and neck supple.     Comments: Left lower extremity edema  Skin:    General: Skin is warm and dry.     Findings: No erythema or rash.  Neurological:     Mental Status: He is alert and oriented to person, place, and time. Mental status is at baseline.     Cranial Nerves: No cranial nerve deficit.     Coordination: Coordination normal.  Psychiatric:        Mood and Affect: Mood normal.    LABORATORY DATA:  I have reviewed the data as listed Lab Results  Component Value Date   WBC 5.4 03/11/2020   HGB 12.5 (L) 03/11/2020   HCT 37.2 (L) 03/11/2020   MCV 89.4 03/11/2020   PLT 213 03/11/2020   Recent Labs    11/27/19 0833 12/11/19 0854 12/25/19 0905 01/08/20 0851 02/05/20 0848 02/26/20 0831 03/11/20 0813  NA 140 139 140   < > 139 137 132*  K 4.1 3.8 4.4   < > 4.5 4.6 4.9  CL 106 104 106   < > 107 104 100  CO2 $Re'24 27 27   'qse$ < > 25 26 21*  GLUCOSE 125* 118* 109*   < > 117* 123* 112*  BUN 20 25* 20   < > 37* 36* 30*  CREATININE 1.10 1.16 1.22   < > 1.16 1.12 1.22  CALCIUM 9.0 9.1 9.1   < > 9.4 9.5 9.2  GFRNONAA >60 >60 >  60   < > >60 >60 >60  GFRAA >60 >60 >60  --   --    --   --   PROT 7.1 7.3 7.0   < > 7.5 7.8 7.4  ALBUMIN 4.0 4.2 4.1   < > 4.3 4.2 4.0  AST $Re'29 28 26   'lUn$ < > $R'29 26 25  'dz$ ALT 40 39 32   < > 37 34 35  ALKPHOS 89 80 80   < > 81 84 88  BILITOT 0.6 0.8 0.9   < > 0.6 0.7 0.6   < > = values in this interval not displayed.   Iron/TIBC/Ferritin/ %Sat    Component Value Date/Time   IRON 85 10/02/2019 0841   TIBC 342 10/02/2019 0841   FERRITIN 61 10/02/2019 0841   IRONPCTSAT 25 10/02/2019 0841      RADIOGRAPHIC STUDIES: I have personally reviewed the radiological images as listed and agreed with the findings in the report. DG HIP UNILAT WITH PELVIS 2-3 VIEWS LEFT  Result Date: 01/24/2020 CLINICAL DATA:  Left hip pain and stiffness, history of melanoma EXAM: DG HIP (WITH OR WITHOUT PELVIS) 2-3V LEFT COMPARISON:  None. FINDINGS: Frontal view of the pelvis as well as frontal and frogleg lateral views of the left hip are obtained. No fracture, subluxation, or dislocation. There is moderate left hip osteoarthritis with superior joint space narrowing and osteophyte formation. Sacroiliac joints are normal. Prominent facet hypertrophic changes at the lumbosacral junction. IMPRESSION: 1. Moderate left hip osteoarthritis.  No acute fracture. 2. Lower lumbar facet hypertrophy. Electronically Signed   By: Randa Ngo M.D.   On: 01/24/2020 11:50       ASSESSMENT & PLAN:  1. Malignant melanoma of overlapping sites ([place])   2. Encounter for antineoplastic immunotherapy   3. Vitiligo   4. Arthralgia, unspecified joint    #Left inguinal nodal recurrence of melanoma.  Status post resection and adjuvant radiation. Labs are reviewed and discussed with patient. Counts are stable, proceed with Nivolumab.  CT in January 2022  # Left hip and knee arthritis Continue to follow up with orthopedic surgeon.    #Vitiligo, stable #Normocytic anemia, mild, with Hb 12.5.  All questions were answered. The patient knows to call the clinic with any problems questions  or concerns.  Follow-up with 2 weeks. We spent sufficient time to discuss many aspect of care, questions were answered to patient's satisfaction.   [name], MD, PhD Hematology Oncology [facility] at [facility] Pager- [phone] 03/11/2020

## 2020-03-24 ENCOUNTER — Inpatient Hospital Stay

## 2020-03-24 ENCOUNTER — Encounter: Payer: Self-pay | Admitting: Oncology

## 2020-03-24 ENCOUNTER — Inpatient Hospital Stay (HOSPITAL_BASED_OUTPATIENT_CLINIC_OR_DEPARTMENT_OTHER): Admitting: Oncology

## 2020-03-24 VITALS — BP 135/67 | HR 66 | Temp 97.5°F | Resp 18 | Wt 234.3 lb

## 2020-03-24 DIAGNOSIS — M2550 Pain in unspecified joint: Secondary | ICD-10-CM | POA: Diagnosis not present

## 2020-03-24 DIAGNOSIS — Z5112 Encounter for antineoplastic immunotherapy: Secondary | ICD-10-CM | POA: Diagnosis not present

## 2020-03-24 DIAGNOSIS — D649 Anemia, unspecified: Secondary | ICD-10-CM | POA: Diagnosis not present

## 2020-03-24 DIAGNOSIS — C438 Malignant melanoma of overlapping sites of skin: Secondary | ICD-10-CM

## 2020-03-24 LAB — COMPREHENSIVE METABOLIC PANEL
ALT: 39 U/L (ref 0–44)
AST: 28 U/L (ref 15–41)
Albumin: 4.2 g/dL (ref 3.5–5.0)
Alkaline Phosphatase: 94 U/L (ref 38–126)
Anion gap: 8 (ref 5–15)
BUN: 23 mg/dL — ABNORMAL HIGH (ref 6–20)
CO2: 27 mmol/L (ref 22–32)
Calcium: 9.6 mg/dL (ref 8.9–10.3)
Chloride: 103 mmol/L (ref 98–111)
Creatinine, Ser: 1.16 mg/dL (ref 0.61–1.24)
GFR, Estimated: >60 mL/min (ref >60)
Glucose, Bld: 96 mg/dL (ref 70–99)
Potassium: 4.6 mmol/L (ref 3.5–5.1)
Sodium: 138 mmol/L (ref 135–145)
Total Bilirubin: 0.7 mg/dL (ref 0.3–1.2)
Total Protein: 7.7 g/dL (ref 6.5–8.1)

## 2020-03-24 LAB — CBC WITH DIFFERENTIAL/PLATELET
Abs Immature Granulocytes: 0.06 10*3/uL (ref 0.00–0.07)
Basophils Absolute: 0 10*3/uL (ref 0.0–0.1)
Basophils Relative: 1 %
Eosinophils Absolute: 0.1 10*3/uL (ref 0.0–0.5)
Eosinophils Relative: 2 %
HCT: 38.7 % — ABNORMAL LOW (ref 39.0–52.0)
Hemoglobin: 12.9 g/dL — ABNORMAL LOW (ref 13.0–17.0)
Immature Granulocytes: 1 %
Lymphocytes Relative: 17 %
Lymphs Abs: 1 10*3/uL (ref 0.7–4.0)
MCH: 29.9 pg (ref 26.0–34.0)
MCHC: 33.3 g/dL (ref 30.0–36.0)
MCV: 89.6 fL (ref 80.0–100.0)
Monocytes Absolute: 0.7 10*3/uL (ref 0.1–1.0)
Monocytes Relative: 11 %
Neutro Abs: 4.1 10*3/uL (ref 1.7–7.7)
Neutrophils Relative %: 68 %
Platelets: 216 10*3/uL (ref 150–400)
RBC: 4.32 MIL/uL (ref 4.22–5.81)
RDW: 13.1 % (ref 11.5–15.5)
WBC: 6.1 10*3/uL (ref 4.0–10.5)
nRBC: 0 % (ref 0.0–0.2)

## 2020-03-24 MED ORDER — LIDOCAINE-PRILOCAINE 2.5-2.5 % EX CREA: 1.0000 "application " | TOPICAL_CREAM | CUTANEOUS | 2 refills | Status: DC | PRN

## 2020-03-24 MED ORDER — TRAMADOL HCL 50 MG PO TABS: 50.0000 mg | ORAL_TABLET | Freq: Three times a day (TID) | ORAL | 0 refills | Status: DC | PRN

## 2020-03-24 NOTE — Progress Notes (Signed)
Hematology/Oncology follow up  note [facility] [phone] [phone]   Patient Care Team: [name], Molly M, MD as PCP - General (Student)  REFERRING PROVIDER: [name], Molly M, MD  CHIEF COMPLAINTS/REASON FOR VISIT:  Follow up for melanoma HISTORY OF PRESENTING ILLNESS:   Edward Pitts is a  59 y.o.  male with PMH listed below was seen in consultation at the request of  [name], Molly M, MD  for evaluation of inguinal mass Patient presented to emergency room 3 days ago complaining about left ing uinal mass discomfort. Reports that he has really noticed the mass growing for the past 1 months. He has a history of left lower extremity melanoma in 2011, status post local excision.  Pain was increased with squatting of laxation. He was advised to take Tylenol for pain. Denies any fever, chills, night sweating.  He does feel mild nauseated. Appetite is fair.  He has lost about 10 pounds since earlier this year. 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  Left inguinal and left iliac nodes which are suspicious for involvement.  There are also 2 small nonspecific hypodense lesions within the right liver, nonspecific.  # patient underwent left groin mass resection On 04/16/2019. Resection pathology showed malignant melanoma, replacing a lymph node, with extracapsular extension, peripheral and deep margins involved.  Left inguinal contents, all 7 lymph nodes were negative for melanoma in the lymph nodes.  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  # PDL1 80% TPS  # 04/16/2019. underwent left groin mass resection   07/07/2019  Status post adjuvant radiation and finished radiation  Patient has Mediport placed to facilitate immunotherapy treatments. INTERVAL HISTORY Edward Pitts Costilla is a 59 y.o. male who has above history  reviewed by me today presents for follow up visit for management of inguinal nodal recurrence of melanoma Problems and complaints are listed below No new complaints.  He continues to have left knee and hip pain.  10 out of 10.  He has orthopedic surgeon appointment on 04/06/2020 for further discussion of surgical intervention.  He takes Tylenol which did not help his pain . :Review of Systems  Constitutional: Negative for appetite change, chills, fatigue, fever and unexpected weight change.  HENT:   Negative for hearing loss and voice change.   Eyes: Negative for eye problems and icterus.  Respiratory: Negative for chest tightness, cough and shortness of breath.   Cardiovascular: Negative for chest pain and leg swelling.  Gastrointestinal: Negative for abdominal distention and abdominal pain.  Endocrine: Negative for hot flashes.  Genitourinary: Negative for difficulty urinating, dysuria and frequency.   Musculoskeletal: Positive for arthralgias.  Skin: Negative for itching and rash.       Skin hypo-pigmentation on upper extremities, no change  Neurological: Negative for light-headedness and numbness.  Hematological: Negative for adenopathy. Does not bruise/bleed easily.  Psychiatric/Behavioral: Negative for confusion.    MEDICAL HISTORY:  Past Medical History:  Diagnosis Date  . Anxiety   . Arthritis   . Complication of anesthesia   . Family history of adverse reaction to anesthesia    PONV mother  . Hyperlipidemia   . Hypertension   . Melanoma (HCC) 2012  . PONV (postoperative nausea and vomiting) 04/16/2019  . Presence of permanent cardiac pacemaker    [employer]  . Seizures (HCC)     SURGICAL HISTORY: Past Surgical History:  Procedure   Laterality Date  . KNEE SURGERY Left   . LEFT HEART CATH AND CORONARY ANGIOGRAPHY Left 06/29/2017   Procedure: LEFT HEART CATH AND CORONARY ANGIOGRAPHY;  Surgeon: Kowalski, Bruce J, MD;  Location: [facility]IVE [facility];  Service:  Cardiovascular;  Laterality: Left;  . LYMPH NODE DISSECTION Left 04/16/2019   Procedure: Left inguinal Lymph Node Dissection;  Surgeon: Byerly, Faera, MD;  Location: [facility] OR;  Service: General;  Laterality: Left;  . MELANOMA EXCISION Left 04/16/2019   Procedure: MELANOMA EXCISION LEFT GROIN MASS;  Surgeon: Byerly, Faera, MD;  Location: [facility] OR;  Service: General;  Laterality: Left;  . MELANOMA EXCISION WITH SENTINEL LYMPH NODE BIOPSY Left 2012   Left calf   . PACEMAKER INSERTION N/A 08/26/2018   Procedure: INSERTION PACEMAKER;  Surgeon: Paraschos, Alexander, MD;  Location: [facility] ORS;  Service: Cardiovascular;  Laterality: N/A;  . PORTA CATH INSERTION N/A 08/26/2019   Procedure: PORTA CATH INSERTION;  Surgeon: Schnier, Gregory G, MD;  Location: [facility];  Service: Cardiovascular;  Laterality: N/A;  . TEMPORARY PACEMAKER N/A 08/25/2018   Procedure: TEMPORARY PACEMAKER;  Surgeon: Cooper, Michael, MD;  Location: [facility];  Service: Cardiovascular;  Laterality: N/A;    SOCIAL HISTORY: Social History   Socioeconomic History  . Marital status: Married    Spouse name: Anna   . Number of children: 7  . Years of education: 12  . Highest education level: Not on file  Occupational History  . Occupation: [employer]   Tobacco Use  . Smoking status: Never Smoker  . Smokeless tobacco: Never Used  Vaping Use  . Vaping Use: Never used  Substance and Sexual Activity  . Alcohol use: No  . Drug use: No  . Sexual activity: Not on file  Other Topics Concern  . Not on file  Social History Narrative   Lives with mother, wife and sister   Caffeine use: sodas (2 per day)   Social Determinants of Health   Financial Resource Strain: Not on file  Food Insecurity: Not on file  Transportation Needs: Not on file  Physical Activity: Not on file  Stress: Not on file  Social Connections: Not on file  Intimate Partner Violence: Not on file    FAMILY HISTORY: Family History  Problem Relation  Age of Onset  . Cancer Paternal Grandmother     ALLERGIES:  is allergic to ibuprofen and nsaids.  MEDICATIONS:  Current Outpatient Medications  Medication Sig Dispense Refill  . acetaminophen (TYLENOL) 650 MG CR tablet Take 650 mg by mouth every 8 (eight) hours as needed for pain.     . allopurinol (ZYLOPRIM) 300 MG tablet Take 300 mg by mouth daily.    . aspirin 81 MG chewable tablet Chew 81 mg by mouth daily.    . atorvastatin (LIPITOR) 20 MG tablet Take 20 mg by mouth daily.    . clonazePAM (KLONOPIN) 0.5 MG tablet 1 tablet in the morning, 2 in the evening 90 tablet 3  . diclofenac Sodium (VOLTAREN) 1 % GEL Apply 2 g topically 4 (four) times daily. 50 g 0  . hydrocortisone 2.5 % cream Apply topically daily as needed.    . ketoconazole (NIZORAL) 2 % cream Apply 1 application topically daily as needed for irritation.    . lidocaine-prilocaine (EMLA) cream Apply 1 application topically as needed. Apply small amount of cream to port site approx 1-2 hours prior to appointment. 30 g 2  . lisinopril (ZESTRIL) 20 MG tablet Take 20 mg   by mouth daily.    . metoprolol succinate (TOPROL-XL) 25 MG 24 hr tablet Take 25 mg by mouth daily.     . Multiple Vitamin (MULTIVITAMIN WITH MINERALS) TABS tablet Take 1 tablet by mouth daily. Centrum Silver    . ondansetron (ZOFRAN) 4 MG tablet Take 1 tablet (4 mg total) by mouth every 8 (eight) hours as needed for nausea or vomiting. 90 tablet 1  . silver sulfADIAZINE (SILVADENE) 1 % cream Apply 1 application topically 2 (two) times daily. (Patient not taking: No sig reported) 50 g 2   No current facility-administered medications for this visit.     PHYSICAL EXAMINATION: ECOG PERFORMANCE STATUS: 1 - Symptomatic but completely ambulatory Vitals:   03/24/20 0848  BP: 135/67  Pulse: 66  Resp: 18  Temp: (!) 97.5 F (36.4 C)   Filed Weights   03/24/20 0848  Weight: 234 lb 4.8 oz (106.3 kg)    Physical Exam Constitutional:      General: He is not in  acute distress. HENT:     Head: Normocephalic and atraumatic.  Eyes:     General: No scleral icterus.    Pupils: Pupils are equal, round, and reactive to light.  Cardiovascular:     Rate and Rhythm: Normal rate and regular rhythm.     Heart sounds: Normal heart sounds.  Pulmonary:     Effort: Pulmonary effort is normal. No respiratory distress.     Breath sounds: No wheezing.  Abdominal:     General: Bowel sounds are normal. There is no distension.     Palpations: Abdomen is soft. There is no mass.     Tenderness: There is no abdominal tenderness.     Comments:  History left groin mass resection and radiation.   Musculoskeletal:        General: No deformity. Normal range of motion.     Cervical back: Normal range of motion and neck supple.     Comments: Left lower extremity edema  Skin:    General: Skin is warm and dry.     Findings: No erythema or rash.  Neurological:     Mental Status: He is alert and oriented to person, place, and time. Mental status is at baseline.     Cranial Nerves: No cranial nerve deficit.     Coordination: Coordination normal.  Psychiatric:        Mood and Affect: Mood normal.    LABORATORY DATA:  I have reviewed the data as listed Lab Results  Component Value Date   WBC 6.1 03/24/2020   HGB 12.9 (L) 03/24/2020   HCT 38.7 (L) 03/24/2020   MCV 89.6 03/24/2020   PLT 216 03/24/2020   Recent Labs    11/27/19 0833 12/11/19 0854 12/25/19 0905 01/08/20 0851 02/26/20 0831 03/11/20 0813 03/24/20 0826  NA 140 139 140   < > 137 132* 138  K 4.1 3.8 4.4   < > 4.6 4.9 4.6  CL 106 104 106   < > 104 100 103  CO2 _0 < > 26 21* 27  GLUCOSE 125* 118* 109*   < > 123* 112* 96  BUN 20 25* 20   < > 36* 30* 23*  CREATININE 1.10 1.16 1.22   < > 1.12 1.22 1.16  CALCIUM 9.0 9.1 9.1   < > 9.5 9.2 9.6  GFRNONAA >60 >60 >60   < > >60 >60 >60  GFRAA >60 >60 >60  --   --   --   --  PROT 7.1 7.3 7.0   < > 7.8 7.4 7.7  ALBUMIN 4.0 4.2 4.1   < > 4.2 4.0  4.2  AST 29 28 26   < > 26 25 28  ALT 40 39 32   < > 34 35 39  ALKPHOS 89 80 80   < > 84 88 94  BILITOT 0.6 0.8 0.9   < > 0.7 0.6 0.7   < > = values in this interval not displayed.   Iron/TIBC/Ferritin/ %Sat    Component Value Date/Time   IRON 85 10/02/2019 0841   TIBC 342 10/02/2019 0841   FERRITIN 61 10/02/2019 0841   IRONPCTSAT 25 10/02/2019 0841      RADIOGRAPHIC STUDIES: I have personally reviewed the radiological images as listed and agreed with the findings in the report. DG HIP UNILAT WITH PELVIS 2-3 VIEWS LEFT  Result Date: 01/24/2020 CLINICAL DATA:  Left hip pain and stiffness, history of melanoma EXAM: DG HIP (WITH OR WITHOUT PELVIS) 2-3V LEFT COMPARISON:  None. FINDINGS: Frontal view of the pelvis as well as frontal and frogleg lateral views of the left hip are obtained. No fracture, subluxation, or dislocation. There is moderate left hip osteoarthritis with superior joint space narrowing and osteophyte formation. Sacroiliac joints are normal. Prominent facet hypertrophic changes at the lumbosacral junction. IMPRESSION: 1. Moderate left hip osteoarthritis.  No acute fracture. 2. Lower lumbar facet hypertrophy. Electronically Signed   By: Michael  Brown M.D.   On: 01/24/2020 11:50       ASSESSMENT & PLAN:  1. Malignant melanoma of overlapping sites (HCC)   2. Encounter for antineoplastic immunotherapy   3. Arthralgia, unspecified joint   4. Anemia, unspecified type    #Left inguinal nodal recurrence of melanoma.  Status post resection and adjuvant radiation. Labs are reviewed and discussed with patient Counts are stable.  Proceed with nivolumab today. CT chest abdomen pelvis in January 2022  # Left hip and knee arthritis Continue to follow up with orthopedic surgeon.   Pain that is not relieved by Tylenol, discussed with patient that I will send him a prescription of tramadol 50 mg every 8 hours as needed, 30 tablets.  I recommend patient to further discuss pain  management with orthopedic surgeon and get refills from .ortho.   Patient verbalized understanding that medications should not be sold or shared, taken with alcohol, or used while driving. Patient educated that medications should not be bitten, chewed, or crushed. patient has been made aware of the sside effects and treatment/ prevention  of constipation,  respiratory depression,  and mental status changes, even lead to overdose that may result in death, if used outside of the parameters that we discussed.   #Vitiligo, stable #Normocytic anemia, mild, with Hb 12.9 All questions were answered. The patient knows to call the clinic with any problems questions or concerns.  Follow-up with 2 weeks. We spent sufficient time to discuss many aspect of care, questions were answered to patient's satisfaction.   Zhou Yu, MD, PhD Hematology Oncology [facility] Pager- [phone] 03/24/2020  

## 2020-03-24 NOTE — Progress Notes (Signed)
Patient's left hip pain is 8/10 today and has an appointment with orthopedic on 1/11 to discuss possibility of surgery.

## 2020-03-25 ENCOUNTER — Inpatient Hospital Stay

## 2020-03-25 VITALS — BP 131/78 | HR 80 | Temp 96.7°F | Resp 18

## 2020-03-25 DIAGNOSIS — C438 Malignant melanoma of overlapping sites of skin: Secondary | ICD-10-CM

## 2020-03-25 DIAGNOSIS — Z5112 Encounter for antineoplastic immunotherapy: Secondary | ICD-10-CM | POA: Diagnosis not present

## 2020-03-25 MED ORDER — SODIUM CHLORIDE 0.9 % IV SOLN
Freq: Once | INTRAVENOUS | Status: AC
  Filled 2020-03-25: qty 250

## 2020-03-25 MED ORDER — HEPARIN SOD (PORK) LOCK FLUSH 100 UNIT/ML IV SOLN
INTRAVENOUS | Status: AC
  Filled 2020-03-25: qty 5

## 2020-03-25 MED ORDER — HEPARIN SOD (PORK) LOCK FLUSH 100 UNIT/ML IV SOLN
500.0000 [IU] | Freq: Once | INTRAVENOUS | Status: AC | PRN
  Administered 2020-03-25: 15:00:00 500 [IU]
  Filled 2020-03-25: qty 5

## 2020-03-25 MED ORDER — SODIUM CHLORIDE 0.9 % IV SOLN
240.0000 mg | Freq: Once | INTRAVENOUS | Status: AC
  Administered 2020-03-25: 14:00:00 240 mg via INTRAVENOUS
  Filled 2020-03-25: qty 24

## 2020-03-31 ENCOUNTER — Other Ambulatory Visit: Payer: Self-pay

## 2020-03-31 ENCOUNTER — Ambulatory Visit
Admission: RE | Admit: 2020-03-31 | Discharge: 2020-03-31 | Disposition: A | Discharge status: Home / Self Care | Source: Ambulatory Visit | Attending: Oncology | Admitting: Oncology

## 2020-03-31 DIAGNOSIS — C438 Malignant melanoma of overlapping sites of skin: Secondary | ICD-10-CM | POA: Diagnosis present

## 2020-03-31 MED ORDER — IOHEXOL 300 MG/ML  SOLN
100.0000 mL | Freq: Once | INTRAMUSCULAR | Status: AC | PRN
  Administered 2020-03-31: 100 mL via INTRAVENOUS

## 2020-04-08 ENCOUNTER — Inpatient Hospital Stay: Attending: Oncology

## 2020-04-08 ENCOUNTER — Other Ambulatory Visit: Payer: Self-pay

## 2020-04-08 ENCOUNTER — Inpatient Hospital Stay (HOSPITAL_BASED_OUTPATIENT_CLINIC_OR_DEPARTMENT_OTHER): Admitting: Oncology

## 2020-04-08 ENCOUNTER — Inpatient Hospital Stay

## 2020-04-08 ENCOUNTER — Encounter: Payer: Self-pay | Admitting: Oncology

## 2020-04-08 VITALS — BP 134/68 | HR 85 | Temp 96.9°F | Resp 18 | Wt 232.0 lb

## 2020-04-08 DIAGNOSIS — L80 Vitiligo: Secondary | ICD-10-CM

## 2020-04-08 DIAGNOSIS — D649 Anemia, unspecified: Secondary | ICD-10-CM | POA: Insufficient documentation

## 2020-04-08 DIAGNOSIS — Z79899 Other long term (current) drug therapy: Secondary | ICD-10-CM | POA: Diagnosis not present

## 2020-04-08 DIAGNOSIS — C438 Malignant melanoma of overlapping sites of skin: Secondary | ICD-10-CM

## 2020-04-08 DIAGNOSIS — M2550 Pain in unspecified joint: Secondary | ICD-10-CM

## 2020-04-08 DIAGNOSIS — C4359 Malignant melanoma of other part of trunk: Secondary | ICD-10-CM | POA: Diagnosis present

## 2020-04-08 DIAGNOSIS — Z5112 Encounter for antineoplastic immunotherapy: Secondary | ICD-10-CM

## 2020-04-08 LAB — CBC WITH DIFFERENTIAL/PLATELET
Abs Immature Granulocytes: 0.06 10*3/uL (ref 0.00–0.07)
Basophils Absolute: 0 10*3/uL (ref 0.0–0.1)
Basophils Relative: 0 %
Eosinophils Absolute: 0.1 10*3/uL (ref 0.0–0.5)
Eosinophils Relative: 3 %
HCT: 37.5 % — ABNORMAL LOW (ref 39.0–52.0)
Hemoglobin: 12.6 g/dL — ABNORMAL LOW (ref 13.0–17.0)
Immature Granulocytes: 1 %
Lymphocytes Relative: 19 %
Lymphs Abs: 1 10*3/uL (ref 0.7–4.0)
MCH: 29.9 pg (ref 26.0–34.0)
MCHC: 33.6 g/dL (ref 30.0–36.0)
MCV: 88.9 fL (ref 80.0–100.0)
Monocytes Absolute: 0.3 10*3/uL (ref 0.1–1.0)
Monocytes Relative: 6 %
Neutro Abs: 3.7 10*3/uL (ref 1.7–7.7)
Neutrophils Relative %: 71 %
Platelets: 205 10*3/uL (ref 150–400)
RBC: 4.22 MIL/uL (ref 4.22–5.81)
RDW: 13 % (ref 11.5–15.5)
WBC: 5.2 10*3/uL (ref 4.0–10.5)
nRBC: 0 % (ref 0.0–0.2)

## 2020-04-08 LAB — COMPREHENSIVE METABOLIC PANEL
ALT: 34 U/L (ref 0–44)
AST: 27 U/L (ref 15–41)
Albumin: 4.1 g/dL (ref 3.5–5.0)
Alkaline Phosphatase: 90 U/L (ref 38–126)
Anion gap: 8 (ref 5–15)
BUN: 28 mg/dL — ABNORMAL HIGH (ref 6–20)
CO2: 23 mmol/L (ref 22–32)
Calcium: 9.1 mg/dL (ref 8.9–10.3)
Chloride: 105 mmol/L (ref 98–111)
Creatinine, Ser: 1.3 mg/dL — ABNORMAL HIGH (ref 0.61–1.24)
GFR, Estimated: >60 mL/min (ref >60)
Glucose, Bld: 156 mg/dL — ABNORMAL HIGH (ref 70–99)
Potassium: 3.9 mmol/L (ref 3.5–5.1)
Sodium: 136 mmol/L (ref 135–145)
Total Bilirubin: 0.6 mg/dL (ref 0.3–1.2)
Total Protein: 7.2 g/dL (ref 6.5–8.1)

## 2020-04-08 LAB — LACTATE DEHYDROGENASE: LDH: 117 U/L (ref 98–192)

## 2020-04-08 LAB — TSH: TSH: 3.318 u[IU]/mL (ref 0.350–4.500)

## 2020-04-08 MED ORDER — SODIUM CHLORIDE 0.9 % IV SOLN
Freq: Once | INTRAVENOUS | Status: AC
  Filled 2020-04-08: qty 250

## 2020-04-08 MED ORDER — SODIUM CHLORIDE 0.9% FLUSH
10.0000 mL | Freq: Once | INTRAVENOUS | Status: AC
  Administered 2020-04-08: 10 mL via INTRAVENOUS
  Filled 2020-04-08: qty 10

## 2020-04-08 MED ORDER — HEPARIN SOD (PORK) LOCK FLUSH 100 UNIT/ML IV SOLN
500.0000 [IU] | Freq: Once | INTRAVENOUS | Status: AC | PRN
  Administered 2020-04-08: 500 [IU]
  Filled 2020-04-08: qty 5

## 2020-04-08 MED ORDER — HEPARIN SOD (PORK) LOCK FLUSH 100 UNIT/ML IV SOLN
INTRAVENOUS | Status: AC
  Filled 2020-04-08: qty 5

## 2020-04-08 MED ORDER — SODIUM CHLORIDE 0.9 % IV SOLN
240.0000 mg | Freq: Once | INTRAVENOUS | Status: AC
  Administered 2020-04-08: 240 mg via INTRAVENOUS
  Filled 2020-04-08: qty 24

## 2020-04-08 NOTE — Progress Notes (Signed)
Pt in for follow up and treatment today.  Pt states he does have concerns he wants to discuss with MD to day.

## 2020-04-08 NOTE — Progress Notes (Signed)
Pt tolerated infusion well with no signs of complications. VSS. Pt stable for discharge.   Edward Pitts  

## 2020-04-08 NOTE — Progress Notes (Signed)
Hematology/Oncology follow up  note [facility] [phone] [phone]   Patient Care Team: [name], [name], MD as PCP - General (Student) [name], MD as Consulting Physician (Oncology)  REFERRING PROVIDER: Donivan Scull, [name], MD  CHIEF COMPLAINTS/REASON FOR VISIT:  Follow up for melanoma HISTORY OF PRESENTING ILLNESS:   Edward Pitts is a  60 y.o.  male with PMH listed below was seen in consultation at the request of  [name], [name], MD  for evaluation of inguinal mass Patient presented to emergency room 3 days ago complaining about left ing uinal mass discomfort. Reports that he has really noticed the mass growing for the past 1 months. He has a history of left lower extremity melanoma in 2011, status post local excision.  Pain was increased with squatting of laxation. He was advised to take Tylenol for pain. Denies any fever, chills, night sweating.  He does feel mild nauseated. Appetite is fair.  He has lost about 10 pounds since earlier this year. 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  Left inguinal and left iliac nodes which are suspicious for involvement.  There are also 2 small nonspecific hypodense lesions within the right liver, nonspecific.  # patient underwent left groin mass resection On 04/16/2019. Resection pathology showed malignant melanoma, replacing a lymph node, with extracapsular extension, peripheral and deep margins involved.  Left inguinal contents, all 7 lymph nodes were negative for melanoma in the lymph nodes.  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  # PDL1 80% TPS  # 04/16/2019. underwent left groin mass resection   07/07/2019  Status post adjuvant radiation and finished radiation  Patient has Mediport placed to facilitate immunotherapy treatments. INTERVAL HISTORY Edward Pitts is a 60 y.o. male who has above history reviewed by me today presents for follow up visit for management of inguinal nodal recurrence of melanoma Problems and complaints are listed below No new complaints.  He continues to have left knee and hip pain.   Patient informs me that he may need to have hip replacement surgery in the near future. Denies rash, shortness of breath, diarrhea . :Review of Systems  Constitutional: Negative for appetite change, chills, fatigue, fever and unexpected weight change.  HENT:   Negative for hearing loss and voice change.   Eyes: Negative for eye problems and icterus.  Respiratory: Negative for chest tightness, cough and shortness of breath.   Cardiovascular: Negative for chest pain and leg swelling.  Gastrointestinal: Negative for abdominal distention and abdominal pain.  Endocrine: Negative for hot flashes.  Genitourinary: Negative for difficulty urinating, dysuria and frequency.   Musculoskeletal: Positive for arthralgias.  Skin: Negative for itching and rash.       Skin hypo-pigmentation on upper extremities, no change  Neurological: Negative for light-headedness and numbness.  Hematological: Negative for adenopathy. Does not bruise/bleed easily.  Psychiatric/Behavioral: Negative for confusion.    MEDICAL HISTORY:  Past Medical History:  Diagnosis Date  . Anxiety   . Arthritis   . Complication of anesthesia   . Family history of adverse reaction to anesthesia    PONV mother  . Hyperlipidemia   . Hypertension   . Melanoma ([place]) 2012  . PONV (postoperative nausea and vomiting) 04/16/2019  . Presence of permanent cardiac pacemaker    [employer]  . Seizures ([place])     SURGICAL HISTORY: Past Surgical History:  Procedure Laterality Date  . KNEE SURGERY Left   . LEFT HEART CATH AND CORONARY ANGIOGRAPHY Left 06/29/2017   Procedure: LEFT HEART CATH AND CORONARY ANGIOGRAPHY;  Surgeon: [name], MD;  Location: [facility];   Service: Cardiovascular;  Laterality: Left;  . LYMPH NODE DISSECTION Left 04/16/2019   Procedure: Left inguinal Lymph Node Dissection;  Surgeon: [name], MD;  Location: [place];  Service: General;  Laterality: Left;  Marland Kitchen MELANOMA EXCISION Left 04/16/2019   Procedure: MELANOMA EXCISION LEFT GROIN MASS;  Surgeon: [name], MD;  Location: [place];  Service: General;  Laterality: Left;  Marland Kitchen MELANOMA EXCISION WITH SENTINEL LYMPH NODE BIOPSY Left 2012   Left calf   . PACEMAKER INSERTION N/A 08/26/2018   Procedure: INSERTION PACEMAKER;  Surgeon: [name], MD;  Location: [facility];  Service: Cardiovascular;  Laterality: N/A;  . PORTA CATH INSERTION N/A 08/26/2019   Procedure: PORTA CATH INSERTION;  Surgeon: Katha Cabal, MD;  Location: [place] [facility];  Service: Cardiovascular;  Laterality: N/A;  . TEMPORARY PACEMAKER N/A 08/25/2018   Procedure: TEMPORARY PACEMAKER;  Surgeon: [name], MD;  Location: [place] [facility];  Service: Cardiovascular;  Laterality: N/A;    SOCIAL HISTORY: Social History   Socioeconomic History  . Marital status: Married    Spouse name: Vicente Males   . Number of children: 7  . Years of education: 22  . Highest education level: Not on file  Occupational History  . Occupation: [employer]   Tobacco Use  . Smoking status: Never Smoker  . Smokeless tobacco: Never Used  Vaping Use  . Vaping Use: Never used  Substance and Sexual Activity  . Alcohol use: No  . Drug use: No  . Sexual activity: Not on file  Other Topics Concern  . Not on file  Social History Narrative   Lives with mother, wife and sister   Caffeine use: sodas (2 per day)   Social Determinants of Health   Financial Resource Strain: Not on file  Food Insecurity: Not on file  Transportation Needs: Not on file  Physical Activity: Not on file  Stress: Not on file  Social Connections: Not on file  Intimate Partner Violence: Not on file    FAMILY HISTORY: Family History  Problem  Relation Age of Onset  . Cancer Paternal Grandmother     ALLERGIES:  is allergic to ibuprofen and nsaids.  MEDICATIONS:  Current Outpatient Medications  Medication Sig Dispense Refill  . acetaminophen (TYLENOL) 650 MG CR tablet Take 650 mg by mouth every 8 (eight) hours as needed for pain.     Marland Kitchen allopurinol (ZYLOPRIM) 300 MG tablet Take 300 mg by mouth daily.    Marland Kitchen aspirin 81 MG chewable tablet Chew 81 mg by mouth daily.    Marland Kitchen atorvastatin (LIPITOR) 20 MG tablet Take 20 mg by mouth daily.    . clonazePAM (KLONOPIN) 0.5 MG tablet 1 tablet in the morning, 2 in the evening 90 tablet 3  . diclofenac Sodium (VOLTAREN) 1 % GEL Apply 2 g topically 4 (four) times daily. 50 g 0  . hydrocortisone 2.5 % cream Apply topically daily as needed.    Marland Kitchen ketoconazole (NIZORAL) 2 % cream Apply 1 application topically daily as needed for irritation.    . lidocaine-prilocaine (EMLA) cream Apply 1 application topically as needed. Apply small amount of cream to port site approx 1-2 hours prior to appointment. 30 g 2  . lisinopril (ZESTRIL) 20 MG tablet Take 20  mg by mouth daily.    . metoprolol succinate (TOPROL-XL) 25 MG 24 hr tablet Take 25 mg by mouth daily.     . Multiple Vitamin (MULTIVITAMIN WITH MINERALS) TABS tablet Take 1 tablet by mouth daily. Centrum Silver    . ondansetron (ZOFRAN) 4 MG tablet Take 1 tablet (4 mg total) by mouth every 8 (eight) hours as needed for nausea or vomiting. 90 tablet 1  . traMADol (ULTRAM) 50 MG tablet Take 1 tablet (50 mg total) by mouth every 8 (eight) hours as needed. 30 tablet 0  . silver sulfADIAZINE (SILVADENE) 1 % cream Apply 1 application topically 2 (two) times daily. (Patient not taking: No sig reported) 50 g 2   No current facility-administered medications for this visit.   Facility-Administered Medications Ordered in Other Visits  Medication Dose Route Frequency Provider Last Rate Last Admin  . heparin lock flush 100 unit/mL  500 Units Intracatheter Once PRN [name], MD      . nivolumab (OPDIVO) 240 mg in sodium chloride 0.9 % 100 mL chemo infusion  240 mg Intravenous Once [name], MD         PHYSICAL EXAMINATION: ECOG PERFORMANCE STATUS: 1 - Symptomatic but completely ambulatory Vitals:   04/08/20 0900  BP: 134/68  Pulse: 85  Resp: 18  Temp: (!) 96.9 F (36.1 C)  SpO2: 96%   Filed Weights   04/08/20 0900  Weight: 232 lb (105.2 kg)    Physical Exam Constitutional:      General: He is not in acute distress. HENT:     Head: Normocephalic and atraumatic.  Eyes:     General: No scleral icterus.    Pupils: Pupils are equal, round, and reactive to light.  Cardiovascular:     Rate and Rhythm: Normal rate and regular rhythm.     Heart sounds: Normal heart sounds.  Pulmonary:     Effort: Pulmonary effort is normal. No respiratory distress.     Breath sounds: No wheezing.  Abdominal:     General: Bowel sounds are normal. There is no distension.     Palpations: Abdomen is soft. There is no mass.     Tenderness: There is no abdominal tenderness.     Comments:  History left groin mass resection and radiation.   Musculoskeletal:        General: No deformity. Normal range of motion.     Cervical back: Normal range of motion and neck supple.     Comments: Left lower extremity edema  Skin:    General: Skin is warm and dry.     Findings: No erythema or rash.  Neurological:     Mental Status: He is alert and oriented to person, place, and time. Mental status is at baseline.     Cranial Nerves: No cranial nerve deficit.     Coordination: Coordination normal.  Psychiatric:        Mood and Affect: Mood normal.    LABORATORY DATA:  I have reviewed the data as listed Lab Results  Component Value Date   WBC 5.2 04/08/2020   HGB 12.6 (L) 04/08/2020   HCT 37.5 (L) 04/08/2020   MCV 88.9 04/08/2020   PLT 205 04/08/2020   Recent Labs    11/27/19 0833 12/11/19 0854 12/25/19 0905 01/08/20 0851 03/11/20 0813 03/24/20 0826  04/08/20 0830  NA 140 139 140   < > 132* 138 136  K 4.1 3.8 4.4   < > 4.9 4.6 3.9  CL  106 104 106   < > 100 103 105  CO2 _0 < > 21* 27 23  GLUCOSE 125* 118* 109*   < > 112* 96 156*  BUN 20 25* 20   < > 30* 23* 28*  CREATININE 1.10 1.16 1.22   < > 1.22 1.16 1.30*  CALCIUM 9.0 9.1 9.1   < > 9.2 9.6 9.1  GFRNONAA >60 >60 >60   < > >60 >60 >60  GFRAA >60 >60 >60  --   --   --   --   PROT 7.1 7.3 7.0   < > 7.4 7.7 7.2  ALBUMIN 4.0 4.2 4.1   < > 4.0 4.2 4.1  AST _1 < > _2 ALT 40 39 32   < > 35 39 34  ALKPHOS 89 80 80   < > 88 94 90  BILITOT 0.6 0.8 0.9   < > 0.6 0.7 0.6   < > = values in this interval not displayed.   Iron/TIBC/Ferritin/ %Sat    Component Value Date/Time   IRON 85 10/02/2019 0841   TIBC 342 10/02/2019 0841   FERRITIN 61 10/02/2019 0841   IRONPCTSAT 25 10/02/2019 0841      RADIOGRAPHIC STUDIES: I have personally reviewed the radiological images as listed and agreed with the findings in the report. CT CHEST ABDOMEN PELVIS W CONTRAST  Result Date: 03/31/2020 CLINICAL DATA:  Melanoma, excision from left calf 2012, recurrence to left inguinal lymph node 2021 EXAM: CT CHEST, ABDOMEN, AND PELVIS WITH CONTRAST TECHNIQUE: Multidetector CT imaging of the chest, abdomen and pelvis was performed following the standard protocol during bolus administration of intravenous contrast. CONTRAST:  134m OMNIPAQUE IOHEXOL 300 MG/ML SOLN, additional oral enteric contrast COMPARISON:  12/10/2019 FINDINGS: CT CHEST FINDINGS Cardiovascular: Right chest port catheter. Aortic atherosclerosis. Normal heart size. Left chest multi lead pacer. Scattered coronary artery calcifications. No pericardial effusion. Mediastinum/Nodes: No enlarged mediastinal, hilar, or axillary lymph nodes. Thyroid gland, trachea, and esophagus demonstrate no significant findings. Lungs/Pleura: Lungs are clear. No pleural effusion or pneumothorax. Musculoskeletal: No chest wall mass or suspicious bone  lesions identified. CT ABDOMEN PELVIS FINDINGS Hepatobiliary: No solid liver abnormality is seen. Stable subcentimeter fluid attenuation lesion of the lateral right lobe of the liver (series 2, image 54). No gallstones, gallbladder wall thickening, or biliary dilatation. Pancreas: Unremarkable. No pancreatic ductal dilatation or surrounding inflammatory changes. Spleen: Normal in size without significant abnormality. Adrenals/Urinary Tract: Adrenal glands are unremarkable. Kidneys are normal, without renal calculi, solid lesion, or hydronephrosis. Bladder is unremarkable. Stomach/Bowel: Stomach is within normal limits. Appendix appears normal. No evidence of bowel wall thickening, distention, or inflammatory changes. Vascular/Lymphatic: Aortic atherosclerosis. No enlarged abdominal or pelvic lymph nodes. Stable postoperative appearance of the left groin (series 2, image 123). Reproductive: No mass or other abnormality. Other: Fat containing umbilical hernia.  No abdominopelvic ascites. Musculoskeletal: No acute or significant osseous findings. IMPRESSION: 1. Stable postoperative appearance of the left groin. No evidence of local recurrence. 2. No evidence of metastatic disease within the chest, abdomen, or pelvis. 3. Stable subcentimeter fluid attenuation lesion of the lateral right lobe of the liver, almost certainly an incidental, benign cyst or hemangioma. 4. Coronary artery disease. Aortic Atherosclerosis (ICD10-I70.0). Electronically Signed   By: AEddie CandleM.D.   On: 03/31/2020 09:44   DG HIP UNILAT WITH PELVIS 2-3 VIEWS LEFT  Result Date: 01/24/2020 CLINICAL DATA:  Left hip pain and stiffness,  history of melanoma EXAM: DG HIP (WITH OR WITHOUT PELVIS) 2-3V LEFT COMPARISON:  None. FINDINGS: Frontal view of the pelvis as well as frontal and frogleg lateral views of the left hip are obtained. No fracture, subluxation, or dislocation. There is moderate left hip osteoarthritis with superior joint space  narrowing and osteophyte formation. Sacroiliac joints are normal. Prominent facet hypertrophic changes at the lumbosacral junction. IMPRESSION: 1. Moderate left hip osteoarthritis.  No acute fracture. 2. Lower lumbar facet hypertrophy. Electronically Signed   By: Randa Ngo M.D.   On: 01/24/2020 11:50       ASSESSMENT & PLAN:  1. Malignant melanoma of overlapping sites ([place])   2. Encounter for antineoplastic immunotherapy   3. Arthralgia, unspecified joint   4. Vitiligo    #Left inguinal nodal recurrence of melanoma.  Status post resection and adjuvant radiation. Labs are reviewed and discussed with patient. Counts are stable.  Proceed with nivolumab treatment today CT chest abdomen pelvis images were independently reviewed and discussed with patient No evidence of disease recurrence or metastatic disease. TSH has been monitored.  # Left hip and knee arthritis  follow up with orthopedic surgeon.   Pain that is not relieved by Tylenol, discussed with patient that I have sent him a prescription of tramadol 50 mg every 8 hours as needed, 30 tablets.  I would defer further pain management to orthopedic surgeon.  #Vitiligo, stable #Normocytic anemia, mild, with Hb 12.6.  Stable. All questions were answered. The patient knows to call the clinic with any problems questions or concerns.  Follow-up with 2 weeks. We spent sufficient time to discuss many aspect of care, questions were answered to patient's satisfaction.   [name], MD, PhD Hematology Oncology [facility] Pager- [phone] 04/08/2020

## 2020-04-20 DIAGNOSIS — M1612 Unilateral primary osteoarthritis, left hip: Secondary | ICD-10-CM | POA: Insufficient documentation

## 2020-04-20 DIAGNOSIS — Z655 Exposure to disaster, war and other hostilities: Secondary | ICD-10-CM | POA: Insufficient documentation

## 2020-04-20 HISTORY — DX: Exposure to disaster, war and other hostilities: Z65.5

## 2020-04-27 ENCOUNTER — Encounter: Payer: Self-pay | Admitting: Oncology

## 2020-04-27 ENCOUNTER — Inpatient Hospital Stay (HOSPITAL_BASED_OUTPATIENT_CLINIC_OR_DEPARTMENT_OTHER): Admitting: Oncology

## 2020-04-27 ENCOUNTER — Inpatient Hospital Stay: Attending: Oncology

## 2020-04-27 ENCOUNTER — Inpatient Hospital Stay

## 2020-04-27 VITALS — BP 160/67 | HR 66 | Temp 98.6°F | Resp 16 | Wt 231.6 lb

## 2020-04-27 DIAGNOSIS — C438 Malignant melanoma of overlapping sites of skin: Secondary | ICD-10-CM

## 2020-04-27 DIAGNOSIS — Z5112 Encounter for antineoplastic immunotherapy: Secondary | ICD-10-CM

## 2020-04-27 DIAGNOSIS — L80 Vitiligo: Secondary | ICD-10-CM | POA: Diagnosis not present

## 2020-04-27 DIAGNOSIS — M2550 Pain in unspecified joint: Secondary | ICD-10-CM | POA: Diagnosis not present

## 2020-04-27 DIAGNOSIS — C4359 Malignant melanoma of other part of trunk: Secondary | ICD-10-CM | POA: Diagnosis present

## 2020-04-27 DIAGNOSIS — D649 Anemia, unspecified: Secondary | ICD-10-CM | POA: Diagnosis not present

## 2020-04-27 LAB — CBC WITH DIFFERENTIAL/PLATELET
Abs Immature Granulocytes: 0.07 10*3/uL (ref 0.00–0.07)
Basophils Absolute: 0 10*3/uL (ref 0.0–0.1)
Basophils Relative: 1 %
Eosinophils Absolute: 0.2 10*3/uL (ref 0.0–0.5)
Eosinophils Relative: 3 %
HCT: 37.3 % — ABNORMAL LOW (ref 39.0–52.0)
Hemoglobin: 12.8 g/dL — ABNORMAL LOW (ref 13.0–17.0)
Immature Granulocytes: 1 %
Lymphocytes Relative: 19 %
Lymphs Abs: 1.1 10*3/uL (ref 0.7–4.0)
MCH: 30.7 pg (ref 26.0–34.0)
MCHC: 34.3 g/dL (ref 30.0–36.0)
MCV: 89.4 fL (ref 80.0–100.0)
Monocytes Absolute: 0.5 10*3/uL (ref 0.1–1.0)
Monocytes Relative: 10 %
Neutro Abs: 3.6 10*3/uL (ref 1.7–7.7)
Neutrophils Relative %: 66 %
Platelets: 244 10*3/uL (ref 150–400)
RBC: 4.17 MIL/uL — ABNORMAL LOW (ref 4.22–5.81)
RDW: 13.4 % (ref 11.5–15.5)
WBC: 5.5 10*3/uL (ref 4.0–10.5)
nRBC: 0.4 % — ABNORMAL HIGH (ref 0.0–0.2)

## 2020-04-27 LAB — COMPREHENSIVE METABOLIC PANEL
ALT: 45 U/L — ABNORMAL HIGH (ref 0–44)
AST: 33 U/L (ref 15–41)
Albumin: 4.5 g/dL (ref 3.5–5.0)
Alkaline Phosphatase: 89 U/L (ref 38–126)
Anion gap: 11 (ref 5–15)
BUN: 29 mg/dL — ABNORMAL HIGH (ref 6–20)
CO2: 25 mmol/L (ref 22–32)
Calcium: 9.8 mg/dL (ref 8.9–10.3)
Chloride: 104 mmol/L (ref 98–111)
Creatinine, Ser: 1.42 mg/dL — ABNORMAL HIGH (ref 0.61–1.24)
GFR, Estimated: 57 mL/min — ABNORMAL LOW (ref >60)
Glucose, Bld: 102 mg/dL — ABNORMAL HIGH (ref 70–99)
Potassium: 4.3 mmol/L (ref 3.5–5.1)
Sodium: 140 mmol/L (ref 135–145)
Total Bilirubin: 0.7 mg/dL (ref 0.3–1.2)
Total Protein: 7.7 g/dL (ref 6.5–8.1)

## 2020-04-27 MED ORDER — HEPARIN SOD (PORK) LOCK FLUSH 100 UNIT/ML IV SOLN
INTRAVENOUS | Status: AC
  Filled 2020-04-27: qty 5

## 2020-04-27 MED ORDER — SODIUM CHLORIDE 0.9 % IV SOLN
Freq: Once | INTRAVENOUS | Status: AC
  Filled 2020-04-27: qty 250

## 2020-04-27 MED ORDER — HEPARIN SOD (PORK) LOCK FLUSH 100 UNIT/ML IV SOLN
500.0000 [IU] | Freq: Once | INTRAVENOUS | Status: AC | PRN
  Administered 2020-04-27: 500 [IU]
  Filled 2020-04-27: qty 5

## 2020-04-27 MED ORDER — SODIUM CHLORIDE 0.9% FLUSH
10.0000 mL | Freq: Once | INTRAVENOUS | Status: AC
  Administered 2020-04-27: 10 mL via INTRAVENOUS
  Filled 2020-04-27: qty 10

## 2020-04-27 MED ORDER — SODIUM CHLORIDE 0.9 % IV SOLN
240.0000 mg | Freq: Once | INTRAVENOUS | Status: AC
  Administered 2020-04-27: 240 mg via INTRAVENOUS
  Filled 2020-04-27: qty 24

## 2020-04-27 NOTE — Progress Notes (Signed)
Hematology/Oncology follow up  note [facility] Telephone:([phone]) [phone] Fax:(336) [phone]   Patient Care Team: [facility] as PCP - General [name], MD as Consulting Physician (Oncology)  REFERRING PROVIDER: Donivan Scull, [name], MD  CHIEF COMPLAINTS/REASON FOR VISIT:  Follow up for melanoma HISTORY OF PRESENTING ILLNESS:   Edward Pitts is a  60 y.o.  male with PMH listed below was seen in consultation at the request of  [name], [name], MD  for evaluation of inguinal mass Patient presented to emergency room 3 days ago complaining about left ing uinal mass discomfort. Reports that he has really noticed the mass growing for the past 1 months. He has a history of left lower extremity melanoma in 2011, status post local excision.  Pain was increased with squatting of laxation. He was advised to take Tylenol for pain. Denies any fever, chills, night sweating.  He does feel mild nauseated. Appetite is fair.  He has lost about 10 pounds since earlier this year. 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  Left inguinal and left iliac nodes which are suspicious for involvement.  There are also 2 small nonspecific hypodense lesions within the right liver, nonspecific.  # patient underwent left groin mass resection On 04/16/2019. Resection pathology showed malignant melanoma, replacing a lymph node, with extracapsular extension, peripheral and deep margins involved.  Left inguinal contents, all 7 lymph nodes were negative for melanoma in the lymph nodes.  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  # PDL1 80% TPS  # 04/16/2019. underwent left groin mass resection   07/07/2019  Status post adjuvant radiation and finished radiation  Patient has Mediport placed to facilitate immunotherapy treatments. INTERVAL HISTORY Edward Pitts is a 60 y.o. male who has above history reviewed by me today presents for follow up visit for management of inguinal nodal recurrence of melanoma Problems and complaints are listed below: Continues to have hip and knee pain.  He follows up with orthopedic surgeon.  Elective hip surgery is not scheduled due to Covid 19 pandemic. Otherwise he is doing well.  Denies shortness of breath or diarrhea . . :Review of Systems  Constitutional: Negative for appetite change, chills, fatigue, fever and unexpected weight change.  HENT:   Negative for hearing loss and voice change.   Eyes: Negative for eye problems and icterus.  Respiratory: Negative for chest tightness, cough and shortness of breath.   Cardiovascular: Negative for chest pain and leg swelling.  Gastrointestinal: Negative for abdominal distention and abdominal pain.  Endocrine: Negative for hot flashes.  Genitourinary: Negative for difficulty urinating, dysuria and frequency.   Musculoskeletal: Positive for arthralgias.  Skin: Negative for itching and rash.       Skin hypo-pigmentation on upper extremities, no change  Neurological: Negative for light-headedness and numbness.  Hematological: Negative for adenopathy. Does not bruise/bleed easily.  Psychiatric/Behavioral: Negative for confusion.    MEDICAL HISTORY:  Past Medical History:  Diagnosis Date  . Anxiety   . Arthritis   . Complication of anesthesia   . Family history of adverse reaction to anesthesia    PONV mother  . Hyperlipidemia   . Hypertension   . Melanoma ([place]) 2012  . PONV (postoperative nausea and vomiting) 04/16/2019  . Presence of permanent cardiac pacemaker    [employer]  . Seizures ([place])     SURGICAL HISTORY: Past Surgical History:  Procedure Laterality Date  . KNEE SURGERY Left   . LEFT HEART CATH AND CORONARY ANGIOGRAPHY Left 06/29/2017   Procedure: LEFT HEART CATH AND CORONARY ANGIOGRAPHY;  Surgeon: [name], MD;  Location: [place] [facility];  Service: Cardiovascular;  Laterality: Left;  . LYMPH NODE DISSECTION Left 04/16/2019   Procedure: Left inguinal Lymph Node Dissection;  Surgeon: [name], MD;  Location: [place];  Service: General;  Laterality: Left;  Marland Kitchen MELANOMA EXCISION Left 04/16/2019   Procedure: MELANOMA EXCISION LEFT GROIN MASS;  Surgeon: [name], MD;  Location: [place];  Service: General;  Laterality: Left;  Marland Kitchen MELANOMA EXCISION WITH SENTINEL LYMPH NODE BIOPSY Left 2012   Left calf   . PACEMAKER INSERTION N/A 08/26/2018   Procedure: INSERTION PACEMAKER;  Surgeon: [name], MD;  Location: [facility];  Service: Cardiovascular;  Laterality: N/A;  . PORTA CATH INSERTION N/A 08/26/2019   Procedure: PORTA CATH INSERTION;  Surgeon: Katha Cabal, MD;  Location: [place] [facility];  Service: Cardiovascular;  Laterality: N/A;  . TEMPORARY PACEMAKER N/A 08/25/2018   Procedure: TEMPORARY PACEMAKER;  Surgeon: [name], MD;  Location: [place] [facility];  Service: Cardiovascular;  Laterality: N/A;    SOCIAL HISTORY: Social History   Socioeconomic History  . Marital status: Married    Spouse name: Vicente Males   . Number of children: 7  . Years of education: 54  . Highest education level: Not on file  Occupational History  . Occupation: [employer]   Tobacco Use  . Smoking status: Never Smoker  . Smokeless tobacco: Never Used  Vaping Use  . Vaping Use: Never used  Substance and Sexual Activity  . Alcohol use: No  . Drug use: No  . Sexual activity: Not on file  Other Topics Concern  . Not on file  Social History Narrative   Lives with mother, wife and sister   Caffeine use: sodas (2 per day)   Social Determinants of Health   Financial Resource Strain: Not on file  Food Insecurity: Not on file  Transportation Needs: Not on file  Physical Activity: Not on file  Stress: Not on file  Social Connections: Not on file  Intimate Partner Violence: Not on file    FAMILY HISTORY: Family  History  Problem Relation Age of Onset  . Cancer Paternal Grandmother     ALLERGIES:  is allergic to ibuprofen and nsaids.  MEDICATIONS:  Current Outpatient Medications  Medication Sig Dispense Refill  . acetaminophen (TYLENOL) 650 MG CR tablet Take 650 mg by mouth every 8 (eight) hours as needed for pain.     Marland Kitchen allopurinol (ZYLOPRIM) 300 MG tablet Take 300 mg by mouth daily.    Marland Kitchen aspirin 81 MG chewable tablet Chew 81 mg by mouth daily.    Marland Kitchen atorvastatin (LIPITOR) 20 MG tablet Take 20 mg by mouth daily.    . clonazePAM (KLONOPIN) 0.5 MG tablet 1 tablet in the morning, 2 in the evening 90 tablet 3  . diclofenac Sodium (VOLTAREN) 1 % GEL Apply 2 g topically 4 (four) times daily. 50 g 0  . hydrocortisone 2.5 % cream Apply topically daily as needed.    Marland Kitchen ketoconazole (NIZORAL) 2 % cream Apply 1 application topically daily as needed for irritation.    . lidocaine-prilocaine (EMLA) cream Apply 1 application topically as needed. Apply small amount of cream to port site approx 1-2 hours prior to appointment. 30 g 2  . lisinopril (ZESTRIL) 20 MG tablet Take 20  mg by mouth daily.    . metoprolol succinate (TOPROL-XL) 25 MG 24 hr tablet Take 25 mg by mouth daily.     . Multiple Vitamin (MULTIVITAMIN WITH MINERALS) TABS tablet Take 1 tablet by mouth daily. Centrum Silver    . ondansetron (ZOFRAN) 4 MG tablet Take 1 tablet (4 mg total) by mouth every 8 (eight) hours as needed for nausea or vomiting. 90 tablet 1  . silver sulfADIAZINE (SILVADENE) 1 % cream Apply 1 application topically 2 (two) times daily. (Patient not taking: No sig reported) 50 g 2  . traMADol (ULTRAM) 50 MG tablet Take 1 tablet (50 mg total) by mouth every 8 (eight) hours as needed. (Patient not taking: Reported on 04/27/2020) 30 tablet 0   No current facility-administered medications for this visit.     PHYSICAL EXAMINATION: ECOG PERFORMANCE STATUS: 1 - Symptomatic but completely ambulatory Vitals:   04/27/20 1024  BP: (!)  160/67  Pulse: 66  Resp: 16  Temp: 98.6 F (37 C)   Filed Weights   04/27/20 1024  Weight: 231 lb 9.6 oz (105.1 kg)    Physical Exam Constitutional:      General: He is not in acute distress. HENT:     Head: Normocephalic and atraumatic.  Eyes:     General: No scleral icterus.    Pupils: Pupils are equal, round, and reactive to light.  Cardiovascular:     Rate and Rhythm: Normal rate and regular rhythm.     Heart sounds: Normal heart sounds.  Pulmonary:     Effort: Pulmonary effort is normal. No respiratory distress.     Breath sounds: No wheezing.  Abdominal:     General: Bowel sounds are normal. There is no distension.     Palpations: Abdomen is soft. There is no mass.     Tenderness: There is no abdominal tenderness.     Comments:  History left groin mass resection and radiation.   Musculoskeletal:        General: No deformity. Normal range of motion.     Cervical back: Normal range of motion and neck supple.     Comments: Left lower extremity edema  Skin:    General: Skin is warm and dry.     Findings: No erythema or rash.  Neurological:     Mental Status: He is alert and oriented to person, place, and time. Mental status is at baseline.     Cranial Nerves: No cranial nerve deficit.     Coordination: Coordination normal.  Psychiatric:        Mood and Affect: Mood normal.    LABORATORY DATA:  I have reviewed the data as listed Lab Results  Component Value Date   WBC 5.5 04/27/2020   HGB 12.8 (L) 04/27/2020   HCT 37.3 (L) 04/27/2020   MCV 89.4 04/27/2020   PLT 244 04/27/2020   Recent Labs    11/27/19 0833 12/11/19 0854 12/25/19 0905 01/08/20 0851 03/24/20 0826 04/08/20 0830 04/27/20 0931  NA 140 139 140   < > 138 136 140  K 4.1 3.8 4.4   < > 4.6 3.9 4.3  CL 106 104 106   < > 103 105 104  CO2 $Re'24 27 27   'IlC$ < > $R'27 23 25  'Wj$ GLUCOSE 125* 118* 109*   < > 96 156* 102*  BUN 20 25* 20   < > 23* 28* 29*  CREATININE 1.10 1.16 1.22   < > 1.16 1.30* 1.42*   CALCIUM  9.0 9.1 9.1   < > 9.6 9.1 9.8  GFRNONAA >60 >60 >60   < > >60 >60 57*  GFRAA >60 >60 >60  --   --   --   --   PROT 7.1 7.3 7.0   < > 7.7 7.2 7.7  ALBUMIN 4.0 4.2 4.1   < > 4.2 4.1 4.5  AST $Re'29 28 26   'eNY$ < > 28 27 33  ALT 40 39 32   < > 39 34 45*  ALKPHOS 89 80 80   < > 94 90 89  BILITOT 0.6 0.8 0.9   < > 0.7 0.6 0.7   < > = values in this interval not displayed.   Iron/TIBC/Ferritin/ %Sat    Component Value Date/Time   IRON 85 10/02/2019 0841   TIBC 342 10/02/2019 0841   FERRITIN 61 10/02/2019 0841   IRONPCTSAT 25 10/02/2019 0841      RADIOGRAPHIC STUDIES: I have personally reviewed the radiological images as listed and agreed with the findings in the report. CT CHEST ABDOMEN PELVIS W CONTRAST  Result Date: 03/31/2020 CLINICAL DATA:  Melanoma, excision from left calf 2012, recurrence to left inguinal lymph node 2021 EXAM: CT CHEST, ABDOMEN, AND PELVIS WITH CONTRAST TECHNIQUE: Multidetector CT imaging of the chest, abdomen and pelvis was performed following the standard protocol during bolus administration of intravenous contrast. CONTRAST:  115mL OMNIPAQUE IOHEXOL 300 MG/ML SOLN, additional oral enteric contrast COMPARISON:  12/10/2019 FINDINGS: CT CHEST FINDINGS Cardiovascular: Right chest port catheter. Aortic atherosclerosis. Normal heart size. Left chest multi lead pacer. Scattered coronary artery calcifications. No pericardial effusion. Mediastinum/Nodes: No enlarged mediastinal, hilar, or axillary lymph nodes. Thyroid gland, trachea, and esophagus demonstrate no significant findings. Lungs/Pleura: Lungs are clear. No pleural effusion or pneumothorax. Musculoskeletal: No chest wall mass or suspicious bone lesions identified. CT ABDOMEN PELVIS FINDINGS Hepatobiliary: No solid liver abnormality is seen. Stable subcentimeter fluid attenuation lesion of the lateral right lobe of the liver (series 2, image 54). No gallstones, gallbladder wall thickening, or biliary dilatation.  Pancreas: Unremarkable. No pancreatic ductal dilatation or surrounding inflammatory changes. Spleen: Normal in size without significant abnormality. Adrenals/Urinary Tract: Adrenal glands are unremarkable. Kidneys are normal, without renal calculi, solid lesion, or hydronephrosis. Bladder is unremarkable. Stomach/Bowel: Stomach is within normal limits. Appendix appears normal. No evidence of bowel wall thickening, distention, or inflammatory changes. Vascular/Lymphatic: Aortic atherosclerosis. No enlarged abdominal or pelvic lymph nodes. Stable postoperative appearance of the left groin (series 2, image 123). Reproductive: No mass or other abnormality. Other: Fat containing umbilical hernia.  No abdominopelvic ascites. Musculoskeletal: No acute or significant osseous findings. IMPRESSION: 1. Stable postoperative appearance of the left groin. No evidence of local recurrence. 2. No evidence of metastatic disease within the chest, abdomen, or pelvis. 3. Stable subcentimeter fluid attenuation lesion of the lateral right lobe of the liver, almost certainly an incidental, benign cyst or hemangioma. 4. Coronary artery disease. Aortic Atherosclerosis (ICD10-I70.0). Electronically Signed   By: Eddie Candle M.D.   On: 03/31/2020 09:44       ASSESSMENT & PLAN:  1. Malignant melanoma of overlapping sites ([place])   2. Encounter for antineoplastic immunotherapy   3. Arthralgia, unspecified joint    #Left inguinal nodal recurrence of melanoma.  Status post resection and adjuvant radiation. Labs are reviewed and discussed with patient Counts are stable. Proceed with nivolumab treatment today TSH has been monitored.  # Left hip and knee arthritis  follow up with orthopedic surgeon.    #Vitiligo,  stable #Normocytic anemia, mild, with Hb 12.8.  Stable. All questions were answered. The patient knows to call the clinic with any problems questions or concerns.  Follow-up with 2 weeks. We spent sufficient time to  discuss many aspect of care, questions were answered to patient's satisfaction.   [name], MD, PhD Hematology Oncology [facility] Pager- [phone] 04/27/2020

## 2020-04-27 NOTE — Progress Notes (Signed)
Patient has been evaluated for the left hip pain but not able to be scheduled for surgery at this time due to COVID restrictions.

## 2020-04-27 NOTE — Progress Notes (Signed)
Pt tolerated infusion well with no signs of complications. VSS. Pt stable for discharge.   Edward Pitts  

## 2020-05-10 ENCOUNTER — Other Ambulatory Visit: Payer: Self-pay

## 2020-05-10 ENCOUNTER — Other Ambulatory Visit: Payer: Self-pay | Admitting: Oncology

## 2020-05-10 DIAGNOSIS — C438 Malignant melanoma of overlapping sites of skin: Secondary | ICD-10-CM

## 2020-05-11 ENCOUNTER — Other Ambulatory Visit: Payer: Self-pay | Admitting: Oncology

## 2020-05-11 ENCOUNTER — Inpatient Hospital Stay

## 2020-05-11 ENCOUNTER — Encounter: Payer: Self-pay | Admitting: Oncology

## 2020-05-11 ENCOUNTER — Inpatient Hospital Stay (HOSPITAL_BASED_OUTPATIENT_CLINIC_OR_DEPARTMENT_OTHER): Admitting: Oncology

## 2020-05-11 VITALS — BP 136/76 | HR 82 | Temp 97.1°F | Resp 18 | Wt 234.1 lb

## 2020-05-11 DIAGNOSIS — C438 Malignant melanoma of overlapping sites of skin: Secondary | ICD-10-CM

## 2020-05-11 DIAGNOSIS — L80 Vitiligo: Secondary | ICD-10-CM | POA: Diagnosis not present

## 2020-05-11 DIAGNOSIS — M2550 Pain in unspecified joint: Secondary | ICD-10-CM

## 2020-05-11 DIAGNOSIS — Z5112 Encounter for antineoplastic immunotherapy: Secondary | ICD-10-CM

## 2020-05-11 LAB — COMPREHENSIVE METABOLIC PANEL
ALT: 39 U/L (ref 0–44)
AST: 30 U/L (ref 15–41)
Albumin: 4.1 g/dL (ref 3.5–5.0)
Alkaline Phosphatase: 86 U/L (ref 38–126)
Anion gap: 10 (ref 5–15)
BUN: 21 mg/dL — ABNORMAL HIGH (ref 6–20)
CO2: 25 mmol/L (ref 22–32)
Calcium: 9.2 mg/dL (ref 8.9–10.3)
Chloride: 104 mmol/L (ref 98–111)
Creatinine, Ser: 1.15 mg/dL (ref 0.61–1.24)
GFR, Estimated: >60 mL/min (ref >60)
Glucose, Bld: 112 mg/dL — ABNORMAL HIGH (ref 70–99)
Potassium: 4.3 mmol/L (ref 3.5–5.1)
Sodium: 139 mmol/L (ref 135–145)
Total Bilirubin: 0.5 mg/dL (ref 0.3–1.2)
Total Protein: 7.5 g/dL (ref 6.5–8.1)

## 2020-05-11 LAB — CBC WITH DIFFERENTIAL/PLATELET
Abs Immature Granulocytes: 0.06 10*3/uL (ref 0.00–0.07)
Basophils Absolute: 0 10*3/uL (ref 0.0–0.1)
Basophils Relative: 0 %
Eosinophils Absolute: 0.2 10*3/uL (ref 0.0–0.5)
Eosinophils Relative: 3 %
HCT: 35.7 % — ABNORMAL LOW (ref 39.0–52.0)
Hemoglobin: 12.1 g/dL — ABNORMAL LOW (ref 13.0–17.0)
Immature Granulocytes: 1 %
Lymphocytes Relative: 20 %
Lymphs Abs: 0.9 10*3/uL (ref 0.7–4.0)
MCH: 30.3 pg (ref 26.0–34.0)
MCHC: 33.9 g/dL (ref 30.0–36.0)
MCV: 89.3 fL (ref 80.0–100.0)
Monocytes Absolute: 0.5 10*3/uL (ref 0.1–1.0)
Monocytes Relative: 10 %
Neutro Abs: 3.1 10*3/uL (ref 1.7–7.7)
Neutrophils Relative %: 66 %
Platelets: 194 10*3/uL (ref 150–400)
RBC: 4 MIL/uL — ABNORMAL LOW (ref 4.22–5.81)
RDW: 13.1 % (ref 11.5–15.5)
WBC: 4.8 10*3/uL (ref 4.0–10.5)
nRBC: 0 % (ref 0.0–0.2)

## 2020-05-11 MED ORDER — HEPARIN SOD (PORK) LOCK FLUSH 100 UNIT/ML IV SOLN
INTRAVENOUS | Status: AC
  Filled 2020-05-11: qty 5

## 2020-05-11 MED ORDER — SODIUM CHLORIDE 0.9 % IV SOLN
Freq: Once | INTRAVENOUS | Status: AC
  Filled 2020-05-11: qty 250

## 2020-05-11 MED ORDER — NIVOLUMAB CHEMO INJECTION 100 MG/10ML
240.0000 mg | Freq: Once | INTRAVENOUS | Status: AC
  Administered 2020-05-11: 240 mg via INTRAVENOUS
  Filled 2020-05-11: qty 24

## 2020-05-11 MED ORDER — HEPARIN SOD (PORK) LOCK FLUSH 100 UNIT/ML IV SOLN
500.0000 [IU] | Freq: Once | INTRAVENOUS | Status: AC | PRN
  Administered 2020-05-11: 500 [IU]
  Filled 2020-05-11: qty 5

## 2020-05-11 NOTE — Progress Notes (Signed)
1110- Patient tolerated treatment well. Patient stable and discharged to home at this time.

## 2020-05-11 NOTE — Progress Notes (Signed)
Hematology/Oncology follow up  note [facility] [phone] [phone]   Patient Care Team: [facility] [place] as PCP - General [name], MD as Consulting Physician (Oncology)  REFERRING PROVIDER: [facility]  CHIEF COMPLAINTS/REASON FOR VISIT:  Follow up for melanoma HISTORY OF PRESENTING ILLNESS:   Edward Pitts is a  60 y.o.  male with PMH listed below was seen in consultation at the request of  [facility]  for evaluation of inguinal mass Patient presented to emergency room 3 days ago complaining about left ing uinal mass discomfort. Reports that he has really noticed the mass growing for the past 1 months. He has a history of left lower extremity melanoma in 2011, status post local excision.  Pain was increased with squatting of laxation. He was advised to take Tylenol for pain. Denies any fever, chills, night sweating.  He does feel mild nauseated. Appetite is fair.  He has lost about 10 pounds since earlier this year. 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  Left inguinal and left iliac nodes which are suspicious for involvement.  There are also 2 small nonspecific hypodense lesions within the right liver, nonspecific.  # patient underwent left groin mass resection On 04/16/2019. Resection pathology showed malignant melanoma, replacing a lymph node, with extracapsular extension, peripheral and deep margins involved.  Left inguinal contents, all 7 lymph nodes were negative for melanoma in the lymph nodes.  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  # PDL1 80% TPS  # 04/16/2019. underwent left groin mass resection   07/07/2019  Status post adjuvant radiation and finished radiation  Patient has Mediport placed to facilitate immunotherapy treatments. INTERVAL  HISTORY Edward Pitts is a 60 y.o. male who has above history reviewed by me today presents for follow up visit for management of inguinal nodal recurrence of melanoma Problems and complaints are listed below: He continues to have hip and knee pain. He follows up with orthopedic surgery and may undergo elective hip surgery in the next few weeks. Denies any shortness of breath or weakness.. . :Review of Systems  Constitutional: Negative for appetite change, chills, fatigue, fever and unexpected weight change.  HENT:   Negative for hearing loss and voice change.   Eyes: Negative for eye problems and icterus.  Respiratory: Negative for chest tightness, cough and shortness of breath.   Cardiovascular: Negative for chest pain and leg swelling.  Gastrointestinal: Negative for abdominal distention and abdominal pain.  Endocrine: Negative for hot flashes.  Genitourinary: Negative for difficulty urinating, dysuria and frequency.   Musculoskeletal: Positive for arthralgias.  Skin: Negative for itching and rash.       Skin hypo-pigmentation on upper extremities, no change  Neurological: Negative for light-headedness and numbness.  Hematological: Negative for adenopathy. Does not bruise/bleed easily.  Psychiatric/Behavioral: Negative for confusion.    MEDICAL HISTORY:  Past Medical History:  Diagnosis Date  . Anxiety   . Arthritis   . Complication of anesthesia   . Family history of adverse reaction to anesthesia    PONV mother  . Hyperlipidemia   . Hypertension   . Melanoma ([place]) 2012  . PONV (postoperative nausea and vomiting) 04/16/2019  . Presence of permanent cardiac pacemaker    [employer]tr[employer]  . Seizures ([place])     SURGICAL HISTORY: Past Surgical History:  Procedure Laterality Date  . KNEE SURGERY Left   .  LEFT HEART CATH AND CORONARY ANGIOGRAPHY Left 06/29/2017   Procedure: LEFT HEART CATH AND CORONARY ANGIOGRAPHY;  Surgeon: [name], MD;  Location: [facility];   Service: Cardiovascular;  Laterality: Left;  . LYMPH NODE DISSECTION Left 04/16/2019   Procedure: Left inguinal Lymph Node Dissection;  Surgeon: [name], MD;  Location: [place];  Service: General;  Laterality: Left;  Marland Kitchen MELANOMA EXCISION Left 04/16/2019   Procedure: MELANOMA EXCISION LEFT GROIN MASS;  Surgeon: [name], MD;  Location: [place];  Service: General;  Laterality: Left;  Marland Kitchen MELANOMA EXCISION WITH SENTINEL LYMPH NODE BIOPSY Left 2012   Left calf   . PACEMAKER INSERTION N/A 08/26/2018   Procedure: INSERTION PACEMAKER;  Surgeon: [name], MD;  Location: [facility];  Service: Cardiovascular;  Laterality: N/A;  . PORTA CATH INSERTION N/A 08/26/2019   Procedure: PORTA CATH INSERTION;  Surgeon: Katha Cabal, MD;  Location: [place] [facility];  Service: Cardiovascular;  Laterality: N/A;  . TEMPORARY PACEMAKER N/A 08/25/2018   Procedure: TEMPORARY PACEMAKER;  Surgeon: [name], MD;  Location: [place] [facility];  Service: Cardiovascular;  Laterality: N/A;    SOCIAL HISTORY: Social History   Socioeconomic History  . Marital status: Married    Spouse name: Vicente Males   . Number of children: 7  . Years of education: 46  . Highest education level: Not on file  Occupational History  . Occupation: [employer]   Tobacco Use  . Smoking status: Never Smoker  . Smokeless tobacco: Never Used  Vaping Use  . Vaping Use: Never used  Substance and Sexual Activity  . Alcohol use: No  . Drug use: No  . Sexual activity: Not on file  Other Topics Concern  . Not on file  Social History Narrative   Lives with mother, wife and sister   Caffeine use: sodas (2 per day)   Social Determinants of Health   Financial Resource Strain: Not on file  Food Insecurity: Not on file  Transportation Needs: Not on file  Physical Activity: Not on file  Stress: Not on file  Social Connections: Not on file  Intimate Partner Violence: Not on file    FAMILY HISTORY: Family History  Problem  Relation Age of Onset  . Cancer Paternal Grandmother     ALLERGIES:  is allergic to ibuprofen and nsaids.  MEDICATIONS:  Current Outpatient Medications  Medication Sig Dispense Refill  . acetaminophen (TYLENOL) 650 MG CR tablet Take 650 mg by mouth every 8 (eight) hours as needed for pain.     Marland Kitchen allopurinol (ZYLOPRIM) 300 MG tablet Take 300 mg by mouth daily.    Marland Kitchen aspirin 81 MG chewable tablet Chew 81 mg by mouth daily.    Marland Kitchen atorvastatin (LIPITOR) 20 MG tablet Take 20 mg by mouth daily.    . clonazePAM (KLONOPIN) 0.5 MG tablet 1 tablet in the morning, 2 in the evening 90 tablet 3  . diclofenac Sodium (VOLTAREN) 1 % GEL Apply 2 g topically 4 (four) times daily. 50 g 0  . hydrocortisone 2.5 % cream Apply topically daily as needed.    Marland Kitchen ketoconazole (NIZORAL) 2 % cream Apply 1 application topically daily as needed for irritation.    . lidocaine-prilocaine (EMLA) cream Apply 1 application topically as needed. Apply small amount of cream to port site approx 1-2 hours prior to appointment. 30 g 2  . lisinopril (ZESTRIL) 20 MG tablet Take 20 mg by mouth daily.    . metoprolol succinate (TOPROL-XL)  25 MG 24 hr tablet Take 25 mg by mouth daily.     . Multiple Vitamin (MULTIVITAMIN WITH MINERALS) TABS tablet Take 1 tablet by mouth daily. Centrum Silver    . ondansetron (ZOFRAN) 4 MG tablet TAKE 1 TABLET BY MOUTH EVERY 8 HOURS AS NEEDED FOR NAUSEA AND VOMITING 90 tablet 1  . silver sulfADIAZINE (SILVADENE) 1 % cream Apply 1 application topically 2 (two) times daily. (Patient not taking: No sig reported) 50 g 2  . traMADol (ULTRAM) 50 MG tablet Take 1 tablet (50 mg total) by mouth every 8 (eight) hours as needed. (Patient not taking: Reported on 04/27/2020) 30 tablet 0   No current facility-administered medications for this visit.     PHYSICAL EXAMINATION: ECOG PERFORMANCE STATUS: 1 - Symptomatic but completely ambulatory Vitals:   05/11/20 0847  BP: 136/76  Pulse: 82  Resp: 18  Temp: (!) 97.1  F (36.2 C)   Filed Weights   05/11/20 0847  Weight: 234 lb 1.6 oz (106.2 kg)    Physical Exam Constitutional:      General: He is not in acute distress. HENT:     Head: Normocephalic and atraumatic.  Eyes:     General: No scleral icterus.    Pupils: Pupils are equal, round, and reactive to light.  Cardiovascular:     Rate and Rhythm: Normal rate and regular rhythm.     Heart sounds: Normal heart sounds.  Pulmonary:     Effort: Pulmonary effort is normal. No respiratory distress.     Breath sounds: No wheezing.  Abdominal:     General: Bowel sounds are normal. There is no distension.     Palpations: Abdomen is soft. There is no mass.     Tenderness: There is no abdominal tenderness.     Comments:  History left groin mass resection and radiation.   Musculoskeletal:        General: No deformity. Normal range of motion.     Cervical back: Normal range of motion and neck supple.     Comments: Left lower extremity edema  Skin:    General: Skin is warm and dry.     Findings: No erythema or rash.  Neurological:     Mental Status: He is alert and oriented to person, place, and time. Mental status is at baseline.     Cranial Nerves: No cranial nerve deficit.     Coordination: Coordination normal.  Psychiatric:        Mood and Affect: Mood normal.    LABORATORY DATA:  I have reviewed the data as listed Lab Results  Component Value Date   WBC 5.5 04/27/2020   HGB 12.8 (L) 04/27/2020   HCT 37.3 (L) 04/27/2020   MCV 89.4 04/27/2020   PLT 244 04/27/2020   Recent Labs    11/27/19 0833 12/11/19 0854 12/25/19 0905 01/08/20 0851 03/24/20 0826 04/08/20 0830 04/27/20 0931  NA 140 139 140   < > 138 136 140  K 4.1 3.8 4.4   < > 4.6 3.9 4.3  CL 106 104 106   < > 103 105 104  CO2 _0 < > _1 GLUCOSE 125* 118* 109*   < > 96 156* 102*  BUN 20 25* 20   < > 23* 28* 29*  CREATININE 1.10 1.16 1.22   < > 1.16 1.30* 1.42*  CALCIUM 9.0 9.1 9.1   < > 9.6 9.1 9.8   GFRNONAA >60 >60 >60   < > >  60 >60 57*  GFRAA >60 >60 >60  --   --   --   --   PROT 7.1 7.3 7.0   < > 7.7 7.2 7.7  ALBUMIN 4.0 4.2 4.1   < > 4.2 4.1 4.5  AST _0 < > 28 27 33  ALT 40 39 32   < > 39 34 45*  ALKPHOS 89 80 80   < > 94 90 89  BILITOT 0.6 0.8 0.9   < > 0.7 0.6 0.7   < > = values in this interval not displayed.   Iron/TIBC/Ferritin/ %Sat    Component Value Date/Time   IRON 85 10/02/2019 0841   TIBC 342 10/02/2019 0841   FERRITIN 61 10/02/2019 0841   IRONPCTSAT 25 10/02/2019 0841      RADIOGRAPHIC STUDIES: I have personally reviewed the radiological images as listed and agreed with the findings in the report. CT CHEST ABDOMEN PELVIS W CONTRAST  Result Date: 03/31/2020 CLINICAL DATA:  Melanoma, excision from left calf 2012, recurrence to left inguinal lymph node 2021 EXAM: CT CHEST, ABDOMEN, AND PELVIS WITH CONTRAST TECHNIQUE: Multidetector CT imaging of the chest, abdomen and pelvis was performed following the standard protocol during bolus administration of intravenous contrast. CONTRAST:  183m OMNIPAQUE IOHEXOL 300 MG/ML SOLN, additional oral enteric contrast COMPARISON:  12/10/2019 FINDINGS: CT CHEST FINDINGS Cardiovascular: Right chest port catheter. Aortic atherosclerosis. Normal heart size. Left chest multi lead pacer. Scattered coronary artery calcifications. No pericardial effusion. Mediastinum/Nodes: No enlarged mediastinal, hilar, or axillary lymph nodes. Thyroid gland, trachea, and esophagus demonstrate no significant findings. Lungs/Pleura: Lungs are clear. No pleural effusion or pneumothorax. Musculoskeletal: No chest wall mass or suspicious bone lesions identified. CT ABDOMEN PELVIS FINDINGS Hepatobiliary: No solid liver abnormality is seen. Stable subcentimeter fluid attenuation lesion of the lateral right lobe of the liver (series 2, image 54). No gallstones, gallbladder wall thickening, or biliary dilatation. Pancreas: Unremarkable. No pancreatic ductal  dilatation or surrounding inflammatory changes. Spleen: Normal in size without significant abnormality. Adrenals/Urinary Tract: Adrenal glands are unremarkable. Kidneys are normal, without renal calculi, solid lesion, or hydronephrosis. Bladder is unremarkable. Stomach/Bowel: Stomach is within normal limits. Appendix appears normal. No evidence of bowel wall thickening, distention, or inflammatory changes. Vascular/Lymphatic: Aortic atherosclerosis. No enlarged abdominal or pelvic lymph nodes. Stable postoperative appearance of the left groin (series 2, image 123). Reproductive: No mass or other abnormality. Other: Fat containing umbilical hernia.  No abdominopelvic ascites. Musculoskeletal: No acute or significant osseous findings. IMPRESSION: 1. Stable postoperative appearance of the left groin. No evidence of local recurrence. 2. No evidence of metastatic disease within the chest, abdomen, or pelvis. 3. Stable subcentimeter fluid attenuation lesion of the lateral right lobe of the liver, almost certainly an incidental, benign cyst or hemangioma. 4. Coronary artery disease. Aortic Atherosclerosis (ICD10-I70.0). Electronically Signed   By: AEddie CandleM.D.   On: 03/31/2020 09:44       ASSESSMENT & PLAN:  1. Malignant melanoma of overlapping sites ([place]   2. Encounter for antineoplastic immunotherapy   3. Arthralgia, unspecified joint   4. Vitiligo    #Left inguinal nodal recurrence of melanoma.  Status post resection and adjuvant radiation. Labs are reviewed and discussed with patient Counts are stable. Proceed with nivolumab treatment today  # Left hip and knee arthritis  follow up with orthopedic surgeon. Tylenol seems not to control his pain. Advised patient to call orthopedic surgeon to see if any other pain regimen can be considered.  #  Vitiligo, stable #Normocytic anemia, mild, with Hb 12.1.  Stable. All questions were answered. The patient knows to call the clinic with any problems  questions or concerns.  Follow-up with 2 weeks. We spent sufficient time to discuss many aspect of care, questions were answered to patient's satisfaction.   [name], MD, PhD Hematology Oncology [facility] Pager- [phone] 05/11/2020

## 2020-05-11 NOTE — Progress Notes (Signed)
Pt here for follow up. No new concerns voiced.   

## 2020-05-25 ENCOUNTER — Inpatient Hospital Stay: Attending: Oncology

## 2020-05-25 ENCOUNTER — Inpatient Hospital Stay (HOSPITAL_BASED_OUTPATIENT_CLINIC_OR_DEPARTMENT_OTHER): Admitting: Oncology

## 2020-05-25 ENCOUNTER — Encounter: Payer: Self-pay | Admitting: Oncology

## 2020-05-25 ENCOUNTER — Inpatient Hospital Stay

## 2020-05-25 VITALS — BP 120/71 | HR 71 | Temp 97.7°F | Resp 18 | Wt 237.4 lb

## 2020-05-25 DIAGNOSIS — C4359 Malignant melanoma of other part of trunk: Secondary | ICD-10-CM | POA: Insufficient documentation

## 2020-05-25 DIAGNOSIS — D649 Anemia, unspecified: Secondary | ICD-10-CM | POA: Diagnosis not present

## 2020-05-25 DIAGNOSIS — Z79899 Other long term (current) drug therapy: Secondary | ICD-10-CM | POA: Diagnosis not present

## 2020-05-25 DIAGNOSIS — M2550 Pain in unspecified joint: Secondary | ICD-10-CM | POA: Diagnosis not present

## 2020-05-25 DIAGNOSIS — C438 Malignant melanoma of overlapping sites of skin: Secondary | ICD-10-CM

## 2020-05-25 DIAGNOSIS — E119 Type 2 diabetes mellitus without complications: Secondary | ICD-10-CM | POA: Insufficient documentation

## 2020-05-25 DIAGNOSIS — Z5112 Encounter for antineoplastic immunotherapy: Secondary | ICD-10-CM

## 2020-05-25 DIAGNOSIS — L80 Vitiligo: Secondary | ICD-10-CM | POA: Insufficient documentation

## 2020-05-25 LAB — COMPREHENSIVE METABOLIC PANEL
ALT: 39 U/L (ref 0–44)
AST: 28 U/L (ref 15–41)
Albumin: 4.2 g/dL (ref 3.5–5.0)
Alkaline Phosphatase: 91 U/L (ref 38–126)
Anion gap: 10 (ref 5–15)
BUN: 22 mg/dL — ABNORMAL HIGH (ref 6–20)
CO2: 24 mmol/L (ref 22–32)
Calcium: 9.3 mg/dL (ref 8.9–10.3)
Chloride: 104 mmol/L (ref 98–111)
Creatinine, Ser: 1.15 mg/dL (ref 0.61–1.24)
GFR, Estimated: >60 mL/min (ref >60)
Glucose, Bld: 129 mg/dL — ABNORMAL HIGH (ref 70–99)
Potassium: 4.4 mmol/L (ref 3.5–5.1)
Sodium: 138 mmol/L (ref 135–145)
Total Bilirubin: 0.6 mg/dL (ref 0.3–1.2)
Total Protein: 7.2 g/dL (ref 6.5–8.1)

## 2020-05-25 LAB — CBC WITH DIFFERENTIAL/PLATELET
Abs Immature Granulocytes: 0.05 10*3/uL (ref 0.00–0.07)
Basophils Absolute: 0 10*3/uL (ref 0.0–0.1)
Basophils Relative: 1 %
Eosinophils Absolute: 0.2 10*3/uL (ref 0.0–0.5)
Eosinophils Relative: 3 %
HCT: 36.1 % — ABNORMAL LOW (ref 39.0–52.0)
Hemoglobin: 11.9 g/dL — ABNORMAL LOW (ref 13.0–17.0)
Immature Granulocytes: 1 %
Lymphocytes Relative: 19 %
Lymphs Abs: 1 10*3/uL (ref 0.7–4.0)
MCH: 30.1 pg (ref 26.0–34.0)
MCHC: 33 g/dL (ref 30.0–36.0)
MCV: 91.2 fL (ref 80.0–100.0)
Monocytes Absolute: 0.4 10*3/uL (ref 0.1–1.0)
Monocytes Relative: 8 %
Neutro Abs: 3.5 10*3/uL (ref 1.7–7.7)
Neutrophils Relative %: 68 %
Platelets: 209 10*3/uL (ref 150–400)
RBC: 3.96 MIL/uL — ABNORMAL LOW (ref 4.22–5.81)
RDW: 12.9 % (ref 11.5–15.5)
WBC: 5.2 10*3/uL (ref 4.0–10.5)
nRBC: 0 % (ref 0.0–0.2)

## 2020-05-25 LAB — TSH: TSH: 2.859 u[IU]/mL (ref 0.350–4.500)

## 2020-05-25 LAB — LACTATE DEHYDROGENASE: LDH: 120 U/L (ref 98–192)

## 2020-05-25 MED ORDER — SODIUM CHLORIDE 0.9 % IV SOLN
Freq: Once | INTRAVENOUS | Status: AC
  Filled 2020-05-25: qty 250

## 2020-05-25 MED ORDER — HEPARIN SOD (PORK) LOCK FLUSH 100 UNIT/ML IV SOLN
500.0000 [IU] | Freq: Once | INTRAVENOUS | Status: AC
  Administered 2020-05-25: 500 [IU] via INTRAVENOUS
  Filled 2020-05-25: qty 5

## 2020-05-25 MED ORDER — SODIUM CHLORIDE 0.9 % IV SOLN
240.0000 mg | Freq: Once | INTRAVENOUS | Status: AC
  Administered 2020-05-25: 240 mg via INTRAVENOUS
  Filled 2020-05-25: qty 24

## 2020-05-25 MED ORDER — SODIUM CHLORIDE 0.9% FLUSH
10.0000 mL | INTRAVENOUS | Status: DC | PRN
  Administered 2020-05-25 (×3): 10 mL via INTRAVENOUS
  Filled 2020-05-25: qty 10

## 2020-05-25 MED ORDER — HEPARIN SOD (PORK) LOCK FLUSH 100 UNIT/ML IV SOLN
INTRAVENOUS | Status: AC
  Filled 2020-05-25: qty 5

## 2020-05-25 NOTE — Progress Notes (Signed)
Pt here for follow up. No new concerns voiced.   

## 2020-05-25 NOTE — Progress Notes (Signed)
Hematology/Oncology follow up  note [facility] Telephone:([phone]) [phone] Fax:(336) [phone]   Patient Care Team: [facility] as PCP - General [name], MD as Consulting Physician (Oncology)  REFERRING PROVIDER: [facility]  CHIEF COMPLAINTS/REASON FOR VISIT:  Follow up for melanoma HISTORY OF PRESENTING ILLNESS:   Edward Pitts is a  60 y.o.  male with PMH listed below was seen in consultation at the request of  [facility]  for evaluation of inguinal mass Patient presented to emergency room 3 days ago complaining about left ing uinal mass discomfort. Reports that he has really noticed the mass growing for the past 1 months. He has a history of left lower extremity melanoma in 2011, status post local excision.  Pain was increased with squatting of laxation. He was advised to take Tylenol for pain. Denies any fever, chills, night sweating.  He does feel mild nauseated. Appetite is fair.  He has lost about 10 pounds since earlier this year. 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  Left inguinal and left iliac nodes which are suspicious for involvement.  There are also 2 small nonspecific hypodense lesions within the right liver, nonspecific.  # patient underwent left groin mass resection On 04/16/2019. Resection pathology showed malignant melanoma, replacing a lymph node, with extracapsular extension, peripheral and deep margins involved.  Left inguinal contents, all 7 lymph nodes were negative for melanoma in the lymph nodes.  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  # PDL1 80% TPS  # 04/16/2019. underwent left groin mass resection   07/07/2019  Status post adjuvant radiation and finished radiation  Patient has Mediport placed to facilitate immunotherapy treatments. INTERVAL  HISTORY Edward Pitts is a 60 y.o. male who has above history reviewed by me today presents for follow up visit for management of inguinal nodal recurrence of melanoma Problems and complaints are listed below: He continues to have hip and knee pain.  He is waiting for scheduling elective hip surgery in the next few weeks. Denies any shortness of breath or weakness.. Denies any diarrhea, nausea vomiting. . :Review of Systems  Constitutional: Negative for appetite change, chills, fatigue, fever and unexpected weight change.  HENT:   Negative for hearing loss and voice change.   Eyes: Negative for eye problems and icterus.  Respiratory: Negative for chest tightness, cough and shortness of breath.   Cardiovascular: Negative for chest pain and leg swelling.  Gastrointestinal: Negative for abdominal distention and abdominal pain.  Endocrine: Negative for hot flashes.  Genitourinary: Negative for difficulty urinating, dysuria and frequency.   Musculoskeletal: Positive for arthralgias.  Skin: Negative for itching and rash.       Skin hypo-pigmentation on upper extremities, no change  Neurological: Negative for light-headedness and numbness.  Hematological: Negative for adenopathy. Does not bruise/bleed easily.  Psychiatric/Behavioral: Negative for confusion.    MEDICAL HISTORY:  Past Medical History:  Diagnosis Date  . Anxiety   . Arthritis   . Complication of anesthesia   . Family history of adverse reaction to anesthesia    PONV mother  . Hyperlipidemia   . Hypertension   . Melanoma ([place]) 2012  . PONV (postoperative nausea and vomiting) 04/16/2019  . Presence of permanent cardiac pacemaker    [employer]tr[employer]  . Seizures ([place])     SURGICAL HISTORY: Past Surgical History:  Procedure Laterality Date  . KNEE  SURGERY Left   . LEFT HEART CATH AND CORONARY ANGIOGRAPHY Left 06/29/2017   Procedure: LEFT HEART CATH AND CORONARY ANGIOGRAPHY;  Surgeon: [name], MD;  Location: [place] [facility];  Service: Cardiovascular;  Laterality: Left;  . LYMPH NODE DISSECTION Left 04/16/2019   Procedure: Left inguinal Lymph Node Dissection;  Surgeon: [name], MD;  Location: [place];  Service: General;  Laterality: Left;  Marland Kitchen MELANOMA EXCISION Left 04/16/2019   Procedure: MELANOMA EXCISION LEFT GROIN MASS;  Surgeon: [name], MD;  Location: [place];  Service: General;  Laterality: Left;  Marland Kitchen MELANOMA EXCISION WITH SENTINEL LYMPH NODE BIOPSY Left 2012   Left calf   . PACEMAKER INSERTION N/A 08/26/2018   Procedure: INSERTION PACEMAKER;  Surgeon: [name], MD;  Location: [facility];  Service: Cardiovascular;  Laterality: N/A;  . PORTA CATH INSERTION N/A 08/26/2019   Procedure: PORTA CATH INSERTION;  Surgeon: Katha Cabal, MD;  Location: [place] [facility];  Service: Cardiovascular;  Laterality: N/A;  . TEMPORARY PACEMAKER N/A 08/25/2018   Procedure: TEMPORARY PACEMAKER;  Surgeon: [name], MD;  Location: [place] [facility];  Service: Cardiovascular;  Laterality: N/A;    SOCIAL HISTORY: Social History   Socioeconomic History  . Marital status: Married    Spouse name: Vicente Males   . Number of children: 7  . Years of education: 68  . Highest education level: Not on file  Occupational History  . Occupation: [employer]   Tobacco Use  . Smoking status: Never Smoker  . Smokeless tobacco: Never Used  Vaping Use  . Vaping Use: Never used  Substance and Sexual Activity  . Alcohol use: No  . Drug use: No  . Sexual activity: Not on file  Other Topics Concern  . Not on file  Social History Narrative   Lives with mother, wife and sister   Caffeine use: sodas (2 per day)   Social Determinants of Health   Financial Resource Strain: Not on file  Food Insecurity: Not on file  Transportation Needs: Not on file  Physical Activity: Not on file  Stress: Not on file  Social Connections: Not on file  Intimate Partner Violence: Not on file    FAMILY HISTORY: Family  History  Problem Relation Age of Onset  . Cancer Paternal Grandmother     ALLERGIES:  is allergic to ibuprofen and nsaids.  MEDICATIONS:  Current Outpatient Medications  Medication Sig Dispense Refill  . acetaminophen (TYLENOL) 650 MG CR tablet Take 650 mg by mouth every 8 (eight) hours as needed for pain.     Marland Kitchen allopurinol (ZYLOPRIM) 300 MG tablet Take 300 mg by mouth daily.    Marland Kitchen aspirin 81 MG chewable tablet Chew 81 mg by mouth daily.    Marland Kitchen atorvastatin (LIPITOR) 20 MG tablet Take 20 mg by mouth daily.    . clonazePAM (KLONOPIN) 0.5 MG tablet 1 tablet in the morning, 2 in the evening 90 tablet 3  . diclofenac Sodium (VOLTAREN) 1 % GEL Apply 2 g topically 4 (four) times daily. 50 g 0  . hydrochlorothiazide (HYDRODIURIL) 25 MG tablet Take 1 tablet by mouth daily.    . hydrocortisone 2.5 % cream Apply topically daily as needed.    Marland Kitchen ketoconazole (NIZORAL) 2 % cream Apply 1 application topically daily as needed for irritation.    . lidocaine-prilocaine (EMLA) cream Apply 1 application topically as needed. Apply small amount of cream to port site approx 1-2 hours prior to appointment. 30 g 2  .  lisinopril (ZESTRIL) 20 MG tablet Take 20 mg by mouth daily.    . metoprolol succinate (TOPROL-XL) 25 MG 24 hr tablet Take 25 mg by mouth daily.     . Multiple Vitamin (MULTIVITAMIN WITH MINERALS) TABS tablet Take 1 tablet by mouth daily. Centrum Silver    . ondansetron (ZOFRAN) 4 MG tablet TAKE 1 TABLET BY MOUTH EVERY 8 HOURS AS NEEDED FOR NAUSEA AND VOMITING 90 tablet 1  . silver sulfADIAZINE (SILVADENE) 1 % cream Apply 1 application topically 2 (two) times daily. (Patient not taking: No sig reported) 50 g 2   No current facility-administered medications for this visit.   Facility-Administered Medications Ordered in Other Visits  Medication Dose Route Frequency Provider Last Rate Last Admin  . heparin lock flush 100 unit/mL  500 Units Intravenous Once [name], MD      . sodium chloride flush (NS)  0.9 % injection 10 mL  10 mL Intravenous PRN [name], MD   10 mL at 05/25/20 0825     PHYSICAL EXAMINATION: ECOG PERFORMANCE STATUS: 1 - Symptomatic but completely ambulatory Vitals:   05/25/20 0842  BP: 120/71  Pulse: 71  Resp: 18  Temp: 97.7 F (36.5 C)   Filed Weights   05/25/20 0842  Weight: 237 lb 6.4 oz (107.7 kg)    Physical Exam Constitutional:      General: He is not in acute distress. HENT:     Head: Normocephalic and atraumatic.  Eyes:     General: No scleral icterus.    Pupils: Pupils are equal, round, and reactive to light.  Cardiovascular:     Rate and Rhythm: Normal rate and regular rhythm.     Heart sounds: Normal heart sounds.  Pulmonary:     Effort: Pulmonary effort is normal. No respiratory distress.     Breath sounds: No wheezing.  Abdominal:     General: Bowel sounds are normal. There is no distension.     Palpations: Abdomen is soft. There is no mass.     Tenderness: There is no abdominal tenderness.     Comments:  History left groin mass resection and radiation.   Musculoskeletal:        General: No deformity. Normal range of motion.     Cervical back: Normal range of motion and neck supple.     Comments: Left lower extremity edema  Skin:    General: Skin is warm and dry.     Findings: No erythema or rash.  Neurological:     Mental Status: He is alert and oriented to person, place, and time. Mental status is at baseline.     Cranial Nerves: No cranial nerve deficit.     Coordination: Coordination normal.  Psychiatric:        Mood and Affect: Mood normal.    LABORATORY DATA:  I have reviewed the data as listed Lab Results  Component Value Date   WBC 5.2 05/25/2020   HGB 11.9 (L) 05/25/2020   HCT 36.1 (L) 05/25/2020   MCV 91.2 05/25/2020   PLT 209 05/25/2020   Recent Labs    11/27/19 0833 12/11/19 0854 12/25/19 0905 01/08/20 0851 04/27/20 0931 05/11/20 0824 05/25/20 0818  NA 140 139 140   < > 140 139 138  K 4.1 3.8 4.4    < > 4.3 4.3 4.4  CL 106 104 106   < > 104 104 104  CO2 _0 < > _1 GLUCOSE 125*  118* 109*   < > 102* 112* 129*  BUN 20 25* 20   < > 29* 21* 22*  CREATININE 1.10 1.16 1.22   < > 1.42* 1.15 1.15  CALCIUM 9.0 9.1 9.1   < > 9.8 9.2 9.3  GFRNONAA >60 >60 >60   < > 57* >60 >60  GFRAA >60 >60 >60  --   --   --   --   PROT 7.1 7.3 7.0   < > 7.7 7.5 7.2  ALBUMIN 4.0 4.2 4.1   < > 4.5 4.1 4.2  AST _0 < > 33 30 28  ALT 40 39 32   < > 45* 39 39  ALKPHOS 89 80 80   < > 89 86 91  BILITOT 0.6 0.8 0.9   < > 0.7 0.5 0.6   < > = values in this interval not displayed.   Iron/TIBC/Ferritin/ %Sat    Component Value Date/Time   IRON 85 10/02/2019 0841   TIBC 342 10/02/2019 0841   FERRITIN 61 10/02/2019 0841   IRONPCTSAT 25 10/02/2019 0841      RADIOGRAPHIC STUDIES: I have personally reviewed the radiological images as listed and agreed with the findings in the report. CT CHEST ABDOMEN PELVIS W CONTRAST  Result Date: 03/31/2020 CLINICAL DATA:  Melanoma, excision from left calf 2012, recurrence to left inguinal lymph node 2021 EXAM: CT CHEST, ABDOMEN, AND PELVIS WITH CONTRAST TECHNIQUE: Multidetector CT imaging of the chest, abdomen and pelvis was performed following the standard protocol during bolus administration of intravenous contrast. CONTRAST:  153m OMNIPAQUE IOHEXOL 300 MG/ML SOLN, additional oral enteric contrast COMPARISON:  12/10/2019 FINDINGS: CT CHEST FINDINGS Cardiovascular: Right chest port catheter. Aortic atherosclerosis. Normal heart size. Left chest multi lead pacer. Scattered coronary artery calcifications. No pericardial effusion. Mediastinum/Nodes: No enlarged mediastinal, hilar, or axillary lymph nodes. Thyroid gland, trachea, and esophagus demonstrate no significant findings. Lungs/Pleura: Lungs are clear. No pleural effusion or pneumothorax. Musculoskeletal: No chest wall mass or suspicious bone lesions identified. CT ABDOMEN PELVIS FINDINGS Hepatobiliary: No  solid liver abnormality is seen. Stable subcentimeter fluid attenuation lesion of the lateral right lobe of the liver (series 2, image 54). No gallstones, gallbladder wall thickening, or biliary dilatation. Pancreas: Unremarkable. No pancreatic ductal dilatation or surrounding inflammatory changes. Spleen: Normal in size without significant abnormality. Adrenals/Urinary Tract: Adrenal glands are unremarkable. Kidneys are normal, without renal calculi, solid lesion, or hydronephrosis. Bladder is unremarkable. Stomach/Bowel: Stomach is within normal limits. Appendix appears normal. No evidence of bowel wall thickening, distention, or inflammatory changes. Vascular/Lymphatic: Aortic atherosclerosis. No enlarged abdominal or pelvic lymph nodes. Stable postoperative appearance of the left groin (series 2, image 123). Reproductive: No mass or other abnormality. Other: Fat containing umbilical hernia.  No abdominopelvic ascites. Musculoskeletal: No acute or significant osseous findings. IMPRESSION: 1. Stable postoperative appearance of the left groin. No evidence of local recurrence. 2. No evidence of metastatic disease within the chest, abdomen, or pelvis. 3. Stable subcentimeter fluid attenuation lesion of the lateral right lobe of the liver, almost certainly an incidental, benign cyst or hemangioma. 4. Coronary artery disease. Aortic Atherosclerosis (ICD10-I70.0). Electronically Signed   By: AEddie CandleM.D.   On: 03/31/2020 09:44       ASSESSMENT & PLAN:  1. Malignant melanoma of overlapping sites ([place]   2. Encounter for antineoplastic immunotherapy   3. Arthralgia, unspecified joint   4. Anemia, unspecified type    #Left inguinal nodal recurrence of melanoma.  Status  post resection and adjuvant radiation. Labs are reviewed and discussed with patient Proceed with nivolumab treatment today.   # Left hip and knee arthritis  follow up with orthopedic surgeon. Tylenol seems not to control his pain.  Advised patient to call orthopedic surgeon to see if any other pain regimen can be considered.  #Vitiligo, stable #Normocytic anemia, mild, hemoglobin 11.9.  Stable. All questions were answered. The patient knows to call the clinic with any problems questions or concerns.  Follow-up with 2 weeks. We spent sufficient time to discuss many aspect of care, questions were answered to patient's satisfaction.   [name], MD, PhD Hematology Oncology [facility] Pager- [phone] 05/25/2020

## 2020-06-07 ENCOUNTER — Other Ambulatory Visit: Payer: Self-pay | Admitting: Neurology

## 2020-06-07 MED ORDER — CLONAZEPAM 0.5 MG PO TABS: ORAL_TABLET | ORAL | 3 refills | Status: DC

## 2020-06-07 NOTE — Telephone Encounter (Signed)
Pt. is requesting a refill for clonazePAM (KLONOPIN) 0.5 MG tablet.  Pharmacy: [id number]

## 2020-06-07 NOTE — Addendum Note (Signed)
Addended by: Rhae Lerner R on: 06/07/2020 02:35 PM   Modules accepted: Orders

## 2020-06-08 ENCOUNTER — Encounter: Payer: Self-pay | Admitting: Oncology

## 2020-06-08 ENCOUNTER — Inpatient Hospital Stay

## 2020-06-08 ENCOUNTER — Inpatient Hospital Stay (HOSPITAL_BASED_OUTPATIENT_CLINIC_OR_DEPARTMENT_OTHER): Admitting: Oncology

## 2020-06-08 VITALS — BP 167/73 | HR 69 | Temp 98.0°F | Resp 16 | Wt 237.3 lb

## 2020-06-08 DIAGNOSIS — Z5112 Encounter for antineoplastic immunotherapy: Secondary | ICD-10-CM

## 2020-06-08 DIAGNOSIS — C438 Malignant melanoma of overlapping sites of skin: Secondary | ICD-10-CM

## 2020-06-08 DIAGNOSIS — M2550 Pain in unspecified joint: Secondary | ICD-10-CM | POA: Diagnosis not present

## 2020-06-08 DIAGNOSIS — D649 Anemia, unspecified: Secondary | ICD-10-CM

## 2020-06-08 LAB — CBC WITH DIFFERENTIAL/PLATELET
Abs Immature Granulocytes: 0.07 10*3/uL (ref 0.00–0.07)
Basophils Absolute: 0 10*3/uL (ref 0.0–0.1)
Basophils Relative: 1 %
Eosinophils Absolute: 0.2 10*3/uL (ref 0.0–0.5)
Eosinophils Relative: 4 %
HCT: 36.3 % — ABNORMAL LOW (ref 39.0–52.0)
Hemoglobin: 12 g/dL — ABNORMAL LOW (ref 13.0–17.0)
Immature Granulocytes: 1 %
Lymphocytes Relative: 19 %
Lymphs Abs: 1 10*3/uL (ref 0.7–4.0)
MCH: 30.2 pg (ref 26.0–34.0)
MCHC: 33.1 g/dL (ref 30.0–36.0)
MCV: 91.2 fL (ref 80.0–100.0)
Monocytes Absolute: 0.4 10*3/uL (ref 0.1–1.0)
Monocytes Relative: 7 %
Neutro Abs: 3.9 10*3/uL (ref 1.7–7.7)
Neutrophils Relative %: 68 %
Platelets: 211 10*3/uL (ref 150–400)
RBC: 3.98 MIL/uL — ABNORMAL LOW (ref 4.22–5.81)
RDW: 13.1 % (ref 11.5–15.5)
WBC: 5.6 10*3/uL (ref 4.0–10.5)
nRBC: 0 % (ref 0.0–0.2)

## 2020-06-08 LAB — COMPREHENSIVE METABOLIC PANEL
ALT: 39 U/L (ref 0–44)
AST: 27 U/L (ref 15–41)
Albumin: 4 g/dL (ref 3.5–5.0)
Alkaline Phosphatase: 85 U/L (ref 38–126)
Anion gap: 8 (ref 5–15)
BUN: 24 mg/dL — ABNORMAL HIGH (ref 6–20)
CO2: 26 mmol/L (ref 22–32)
Calcium: 9.1 mg/dL (ref 8.9–10.3)
Chloride: 104 mmol/L (ref 98–111)
Creatinine, Ser: 1.17 mg/dL (ref 0.61–1.24)
GFR, Estimated: >60 mL/min (ref >60)
Glucose, Bld: 133 mg/dL — ABNORMAL HIGH (ref 70–99)
Potassium: 4.1 mmol/L (ref 3.5–5.1)
Sodium: 138 mmol/L (ref 135–145)
Total Bilirubin: 0.6 mg/dL (ref 0.3–1.2)
Total Protein: 7 g/dL (ref 6.5–8.1)

## 2020-06-08 MED ORDER — SODIUM CHLORIDE 0.9% FLUSH
10.0000 mL | INTRAVENOUS | Status: DC | PRN
  Administered 2020-06-08: 10 mL via INTRAVENOUS
  Filled 2020-06-08: qty 10

## 2020-06-08 MED ORDER — SODIUM CHLORIDE 0.9 % IV SOLN
240.0000 mg | Freq: Once | INTRAVENOUS | Status: AC
  Administered 2020-06-08: 240 mg via INTRAVENOUS
  Filled 2020-06-08: qty 24

## 2020-06-08 MED ORDER — HEPARIN SOD (PORK) LOCK FLUSH 100 UNIT/ML IV SOLN
500.0000 [IU] | Freq: Once | INTRAVENOUS | Status: AC
  Administered 2020-06-08: 500 [IU] via INTRAVENOUS
  Filled 2020-06-08: qty 5

## 2020-06-08 MED ORDER — SODIUM CHLORIDE 0.9 % IV SOLN
Freq: Once | INTRAVENOUS | Status: AC
Start: 2020-06-08 — End: 2020-06-08
  Filled 2020-06-08: qty 250

## 2020-06-08 MED ORDER — HEPARIN SOD (PORK) LOCK FLUSH 100 UNIT/ML IV SOLN
INTRAVENOUS | Status: AC
  Filled 2020-06-08: qty 5

## 2020-06-08 NOTE — Progress Notes (Signed)
Patient is scheduled for left hip surgery on 07/14/20.

## 2020-06-08 NOTE — Progress Notes (Signed)
Hematology/Oncology follow up  note [facility] [phone] [phone]   Patient Care Team: [facility] [place] as PCP - General [name], MD as Consulting Physician (Oncology)  REFERRING PROVIDER: [facility]  CHIEF COMPLAINTS/REASON FOR VISIT:  Follow up for melanoma HISTORY OF PRESENTING ILLNESS:   Edward Pitts is a  60 y.o.  male with PMH listed below was seen in consultation at the request of  [facility]  for evaluation of inguinal mass Patient presented to emergency room 3 days ago complaining about left ing uinal mass discomfort. Reports that he has really noticed the mass growing for the past 1 months. He has a history of left lower extremity melanoma in 2011, status post local excision.  Pain was increased with squatting of laxation. He was advised to take Tylenol for pain. Denies any fever, chills, night sweating.  He does feel mild nauseated. Appetite is fair.  He has lost about 10 pounds since earlier this year. 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  Left inguinal and left iliac nodes which are suspicious for involvement.  There are also 2 small nonspecific hypodense lesions within the right liver, nonspecific.  # patient underwent left groin mass resection On 04/16/2019. Resection pathology showed malignant melanoma, replacing a lymph node, with extracapsular extension, peripheral and deep margins involved.  Left inguinal contents, all 7 lymph nodes were negative for melanoma in the lymph nodes.  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  # PDL1 80% TPS  # 04/16/2019. underwent left groin mass resection   07/07/2019  Status post adjuvant radiation and finished radiation  Patient has Mediport placed to facilitate immunotherapy treatments. INTERVAL  HISTORY Edward Pitts is a 60 y.o. male who has above history reviewed by me today presents for follow up visit for management of inguinal nodal recurrence of melanoma Problems and complaints are listed below: He continues to have hip and knee pain He is scheduled to have hip surgery on 07/14/2020. Denies shortness of breath, rash, diarrhea.  Otherwise no new complaints.   . :Review of Systems  Constitutional: Negative for appetite change, chills, fatigue, fever and unexpected weight change.  HENT:   Negative for hearing loss and voice change.   Eyes: Negative for eye problems and icterus.  Respiratory: Negative for chest tightness, cough and shortness of breath.   Cardiovascular: Negative for chest pain and leg swelling.  Gastrointestinal: Negative for abdominal distention and abdominal pain.  Endocrine: Negative for hot flashes.  Genitourinary: Negative for difficulty urinating, dysuria and frequency.   Musculoskeletal: Positive for arthralgias.  Skin: Negative for itching and rash.       Skin hypo-pigmentation on upper extremities, no change  Neurological: Negative for light-headedness and numbness.  Hematological: Negative for adenopathy. Does not bruise/bleed easily.  Psychiatric/Behavioral: Negative for confusion.    MEDICAL HISTORY:  Past Medical History:  Diagnosis Date  . Anxiety   . Arthritis   . Complication of anesthesia   . Family history of adverse reaction to anesthesia    PONV mother  . Hyperlipidemia   . Hypertension   . Melanoma ([place]) 2012  . PONV (postoperative nausea and vomiting) 04/16/2019  . Presence of permanent cardiac pacemaker    [employer]  . Seizures ([place])     SURGICAL HISTORY: Past Surgical History:  Procedure Laterality Date  . KNEE SURGERY Left   .  LEFT HEART CATH AND CORONARY ANGIOGRAPHY Left 06/29/2017   Procedure: LEFT HEART CATH AND CORONARY ANGIOGRAPHY;  Surgeon: [name], MD;  Location: [facility];  Service:  Cardiovascular;  Laterality: Left;  . LYMPH NODE DISSECTION Left 04/16/2019   Procedure: Left inguinal Lymph Node Dissection;  Surgeon: [name], MD;  Location: [place];  Service: General;  Laterality: Left;  Marland Kitchen MELANOMA EXCISION Left 04/16/2019   Procedure: MELANOMA EXCISION LEFT GROIN MASS;  Surgeon: [name], MD;  Location: [place];  Service: General;  Laterality: Left;  Marland Kitchen MELANOMA EXCISION WITH SENTINEL LYMPH NODE BIOPSY Left 2012   Left calf   . PACEMAKER INSERTION N/A 08/26/2018   Procedure: INSERTION PACEMAKER;  Surgeon: [name], MD;  Location: [facility];  Service: Cardiovascular;  Laterality: N/A;  . PORTA CATH INSERTION N/A 08/26/2019   Procedure: PORTA CATH INSERTION;  Surgeon: Katha Cabal, MD;  Location: [facility];  Service: Cardiovascular;  Laterality: N/A;  . TEMPORARY PACEMAKER N/A 08/25/2018   Procedure: TEMPORARY PACEMAKER;  Surgeon: [name], MD;  Location: [place] [facility];  Service: Cardiovascular;  Laterality: N/A;    SOCIAL HISTORY: Social History   Socioeconomic History  . Marital status: Married    Spouse name: Vicente Males   . Number of children: 7  . Years of education: 77  . Highest education level: Not on file  Occupational History  . Occupation: [employer]   Tobacco Use  . Smoking status: Never Smoker  . Smokeless tobacco: Never Used  Vaping Use  . Vaping Use: Never used  Substance and Sexual Activity  . Alcohol use: No  . Drug use: No  . Sexual activity: Not on file  Other Topics Concern  . Not on file  Social History Narrative   Lives with mother, wife and sister   Caffeine use: sodas (2 per day)   Social Determinants of Health   Financial Resource Strain: Not on file  Food Insecurity: Not on file  Transportation Needs: Not on file  Physical Activity: Not on file  Stress: Not on file  Social Connections: Not on file  Intimate Partner Violence: Not on file    FAMILY HISTORY: Family History  Problem Relation  Age of Onset  . Cancer Paternal Grandmother     ALLERGIES:  is allergic to ibuprofen and nsaids.  MEDICATIONS:  Current Outpatient Medications  Medication Sig Dispense Refill  . acetaminophen (TYLENOL) 650 MG CR tablet Take 650 mg by mouth every 8 (eight) hours as needed for pain.     Marland Kitchen allopurinol (ZYLOPRIM) 300 MG tablet Take 300 mg by mouth daily.    Marland Kitchen aspirin 81 MG chewable tablet Chew 81 mg by mouth daily.    Marland Kitchen atorvastatin (LIPITOR) 20 MG tablet Take 20 mg by mouth daily.    . clonazePAM (KLONOPIN) 0.5 MG tablet 1 tablet in the morning, 2 in the evening 90 tablet 3  . diclofenac Sodium (VOLTAREN) 1 % GEL Apply 2 g topically 4 (four) times daily. 50 g 0  . hydrochlorothiazide (HYDRODIURIL) 25 MG tablet Take 1 tablet by mouth daily.    . hydrocortisone 2.5 % cream Apply topically daily as needed.    Marland Kitchen ketoconazole (NIZORAL) 2 % cream Apply 1 application topically daily as needed for irritation.    . lidocaine-prilocaine (EMLA) cream Apply 1 application topically as needed. Apply small amount of cream to port site approx 1-2 hours prior to appointment. 30 g 2  . lisinopril (ZESTRIL) 20  MG tablet Take 20 mg by mouth daily.    . metoprolol succinate (TOPROL-XL) 25 MG 24 hr tablet Take 25 mg by mouth daily.     . Multiple Vitamin (MULTIVITAMIN WITH MINERALS) TABS tablet Take 1 tablet by mouth daily. Centrum Silver    . ondansetron (ZOFRAN) 4 MG tablet TAKE 1 TABLET BY MOUTH EVERY 8 HOURS AS NEEDED FOR NAUSEA AND VOMITING 90 tablet 1  . silver sulfADIAZINE (SILVADENE) 1 % cream Apply 1 application topically 2 (two) times daily. (Patient not taking: No sig reported) 50 g 2   No current facility-administered medications for this visit.   Facility-Administered Medications Ordered in Other Visits  Medication Dose Route Frequency Provider Last Rate Last Admin  . sodium chloride flush (NS) 0.9 % injection 10 mL  10 mL Intravenous PRN [name], MD   10 mL at 06/08/20 0834     PHYSICAL  EXAMINATION: ECOG PERFORMANCE STATUS: 1 - Symptomatic but completely ambulatory Vitals:   06/08/20 0851  BP: (!) 167/73  Pulse: 69  Resp: 16  Temp: 98 F (36.7 C)   Filed Weights   06/08/20 0851  Weight: 237 lb 4.8 oz (107.6 kg)    Physical Exam Constitutional:      General: He is not in acute distress. HENT:     Head: Normocephalic and atraumatic.  Eyes:     General: No scleral icterus.    Pupils: Pupils are equal, round, and reactive to light.  Cardiovascular:     Rate and Rhythm: Normal rate and regular rhythm.     Heart sounds: Normal heart sounds.  Pulmonary:     Effort: Pulmonary effort is normal. No respiratory distress.     Breath sounds: No wheezing.  Abdominal:     General: Bowel sounds are normal. There is no distension.     Palpations: Abdomen is soft. There is no mass.     Tenderness: There is no abdominal tenderness.     Comments:  History left groin mass resection and radiation.   Musculoskeletal:        General: No deformity. Normal range of motion.     Cervical back: Normal range of motion and neck supple.     Comments: Left lower extremity edema  Skin:    General: Skin is warm and dry.     Findings: No erythema or rash.  Neurological:     Mental Status: He is alert and oriented to person, place, and time. Mental status is at baseline.     Cranial Nerves: No cranial nerve deficit.     Coordination: Coordination normal.  Psychiatric:        Mood and Affect: Mood normal.    LABORATORY DATA:  I have reviewed the data as listed Lab Results  Component Value Date   WBC 5.6 06/08/2020   HGB 12.0 (L) 06/08/2020   HCT 36.3 (L) 06/08/2020   MCV 91.2 06/08/2020   PLT 211 06/08/2020   Recent Labs    11/27/19 0833 12/11/19 0854 12/25/19 0905 01/08/20 0851 05/11/20 0824 05/25/20 0818 06/08/20 0834  NA 140 139 140   < > 139 138 138  K 4.1 3.8 4.4   < > 4.3 4.4 4.1  CL 106 104 106   < > 104 104 104  CO2 $Re'24 27 27   'lKQ$ < > $R'25 24 26  'sD$ GLUCOSE 125*  118* 109*   < > 112* 129* 133*  BUN 20 25* 20   < > 21* 22* 24*  CREATININE 1.10 1.16 1.22   < > 1.15 1.15 1.17  CALCIUM 9.0 9.1 9.1   < > 9.2 9.3 9.1  GFRNONAA >60 >60 >60   < > >60 >60 >60  GFRAA >60 >60 >60  --   --   --   --   PROT 7.1 7.3 7.0   < > 7.5 7.2 7.0  ALBUMIN 4.0 4.2 4.1   < > 4.1 4.2 4.0  AST $Re'29 28 26   'HuB$ < > $R'30 28 27  'Op$ ALT 40 39 32   < > 39 39 39  ALKPHOS 89 80 80   < > 86 91 85  BILITOT 0.6 0.8 0.9   < > 0.5 0.6 0.6   < > = values in this interval not displayed.   Iron/TIBC/Ferritin/ %Sat    Component Value Date/Time   IRON 85 10/02/2019 0841   TIBC 342 10/02/2019 0841   FERRITIN 61 10/02/2019 0841   IRONPCTSAT 25 10/02/2019 0841      RADIOGRAPHIC STUDIES: I have personally reviewed the radiological images as listed and agreed with the findings in the report. CT CHEST ABDOMEN PELVIS W CONTRAST  Result Date: 03/31/2020 CLINICAL DATA:  Melanoma, excision from left calf 2012, recurrence to left inguinal lymph node 2021 EXAM: CT CHEST, ABDOMEN, AND PELVIS WITH CONTRAST TECHNIQUE: Multidetector CT imaging of the chest, abdomen and pelvis was performed following the standard protocol during bolus administration of intravenous contrast. CONTRAST:  189mL OMNIPAQUE IOHEXOL 300 MG/ML SOLN, additional oral enteric contrast COMPARISON:  12/10/2019 FINDINGS: CT CHEST FINDINGS Cardiovascular: Right chest port catheter. Aortic atherosclerosis. Normal heart size. Left chest multi lead pacer. Scattered coronary artery calcifications. No pericardial effusion. Mediastinum/Nodes: No enlarged mediastinal, hilar, or axillary lymph nodes. Thyroid gland, trachea, and esophagus demonstrate no significant findings. Lungs/Pleura: Lungs are clear. No pleural effusion or pneumothorax. Musculoskeletal: No chest wall mass or suspicious bone lesions identified. CT ABDOMEN PELVIS FINDINGS Hepatobiliary: No solid liver abnormality is seen. Stable subcentimeter fluid attenuation lesion of the lateral right  lobe of the liver (series 2, image 54). No gallstones, gallbladder wall thickening, or biliary dilatation. Pancreas: Unremarkable. No pancreatic ductal dilatation or surrounding inflammatory changes. Spleen: Normal in size without significant abnormality. Adrenals/Urinary Tract: Adrenal glands are unremarkable. Kidneys are normal, without renal calculi, solid lesion, or hydronephrosis. Bladder is unremarkable. Stomach/Bowel: Stomach is within normal limits. Appendix appears normal. No evidence of bowel wall thickening, distention, or inflammatory changes. Vascular/Lymphatic: Aortic atherosclerosis. No enlarged abdominal or pelvic lymph nodes. Stable postoperative appearance of the left groin (series 2, image 123). Reproductive: No mass or other abnormality. Other: Fat containing umbilical hernia.  No abdominopelvic ascites. Musculoskeletal: No acute or significant osseous findings. IMPRESSION: 1. Stable postoperative appearance of the left groin. No evidence of local recurrence. 2. No evidence of metastatic disease within the chest, abdomen, or pelvis. 3. Stable subcentimeter fluid attenuation lesion of the lateral right lobe of the liver, almost certainly an incidental, benign cyst or hemangioma. 4. Coronary artery disease. Aortic Atherosclerosis (ICD10-I70.0). Electronically Signed   By: Eddie Candle M.D.   On: 03/31/2020 09:44       ASSESSMENT & PLAN:  1. Malignant melanoma of overlapping sites ([place])   2. Encounter for antineoplastic immunotherapy   3. Arthralgia, unspecified joint   4. Anemia, unspecified type    #Left inguinal nodal recurrence of melanoma.  Status post resection and adjuvant radiation. Labs are reviewed and are discussed with patient. Proceed with nivolumab today. Obtain surveillance CT chest abdomen  pelvis with contrast in April 2022..   # Left hip and knee arthritis Follow up with orthopedic surgeon.  #Vitiligo, stable #Normocytic anemia, mild, hemoglobin 12 stable. All  questions were answered. The patient knows to call the clinic with any problems questions or concerns.  Follow-up with 2 weeks. We spent sufficient time to discuss many aspect of care, questions were answered to patient's satisfaction.   [name], MD, PhD Hematology Oncology [facility] Pager- [phone] 06/08/2020

## 2020-06-22 ENCOUNTER — Inpatient Hospital Stay

## 2020-06-22 ENCOUNTER — Inpatient Hospital Stay (HOSPITAL_BASED_OUTPATIENT_CLINIC_OR_DEPARTMENT_OTHER): Admitting: Oncology

## 2020-06-22 ENCOUNTER — Encounter: Payer: Self-pay | Admitting: Oncology

## 2020-06-22 ENCOUNTER — Other Ambulatory Visit: Payer: Self-pay | Admitting: Oncology

## 2020-06-22 VITALS — BP 145/72 | HR 76 | Temp 96.8°F | Resp 16 | Wt 236.4 lb

## 2020-06-22 DIAGNOSIS — Z5112 Encounter for antineoplastic immunotherapy: Secondary | ICD-10-CM

## 2020-06-22 DIAGNOSIS — C438 Malignant melanoma of overlapping sites of skin: Secondary | ICD-10-CM

## 2020-06-22 DIAGNOSIS — M2550 Pain in unspecified joint: Secondary | ICD-10-CM | POA: Diagnosis not present

## 2020-06-22 LAB — CBC WITH DIFFERENTIAL/PLATELET
Abs Immature Granulocytes: 0.05 10*3/uL (ref 0.00–0.07)
Basophils Absolute: 0 10*3/uL (ref 0.0–0.1)
Basophils Relative: 1 %
Eosinophils Absolute: 0.2 10*3/uL (ref 0.0–0.5)
Eosinophils Relative: 4 %
HCT: 36.2 % — ABNORMAL LOW (ref 39.0–52.0)
Hemoglobin: 12.1 g/dL — ABNORMAL LOW (ref 13.0–17.0)
Immature Granulocytes: 1 %
Lymphocytes Relative: 19 %
Lymphs Abs: 1 10*3/uL (ref 0.7–4.0)
MCH: 30.4 pg (ref 26.0–34.0)
MCHC: 33.4 g/dL (ref 30.0–36.0)
MCV: 91 fL (ref 80.0–100.0)
Monocytes Absolute: 0.4 10*3/uL (ref 0.1–1.0)
Monocytes Relative: 7 %
Neutro Abs: 3.7 10*3/uL (ref 1.7–7.7)
Neutrophils Relative %: 68 %
Platelets: 206 10*3/uL (ref 150–400)
RBC: 3.98 MIL/uL — ABNORMAL LOW (ref 4.22–5.81)
RDW: 13.1 % (ref 11.5–15.5)
WBC: 5.4 10*3/uL (ref 4.0–10.5)
nRBC: 0 % (ref 0.0–0.2)

## 2020-06-22 LAB — COMPREHENSIVE METABOLIC PANEL
ALT: 37 U/L (ref 0–44)
AST: 26 U/L (ref 15–41)
Albumin: 4.2 g/dL (ref 3.5–5.0)
Alkaline Phosphatase: 74 U/L (ref 38–126)
Anion gap: 12 (ref 5–15)
BUN: 22 mg/dL — ABNORMAL HIGH (ref 6–20)
CO2: 22 mmol/L (ref 22–32)
Calcium: 9.1 mg/dL (ref 8.9–10.3)
Chloride: 102 mmol/L (ref 98–111)
Creatinine, Ser: 1.06 mg/dL (ref 0.61–1.24)
GFR, Estimated: >60 mL/min (ref >60)
Glucose, Bld: 135 mg/dL — ABNORMAL HIGH (ref 70–99)
Potassium: 3.9 mmol/L (ref 3.5–5.1)
Sodium: 136 mmol/L (ref 135–145)
Total Bilirubin: 0.6 mg/dL (ref 0.3–1.2)
Total Protein: 7.2 g/dL (ref 6.5–8.1)

## 2020-06-22 LAB — TSH: TSH: 2.835 u[IU]/mL (ref 0.350–4.500)

## 2020-06-22 LAB — LACTATE DEHYDROGENASE: LDH: 122 U/L (ref 98–192)

## 2020-06-22 MED ORDER — TRAMADOL HCL 50 MG PO TABS: 50.0000 mg | ORAL_TABLET | Freq: Two times a day (BID) | ORAL | 0 refills | Status: DC | PRN

## 2020-06-22 MED ORDER — SODIUM CHLORIDE 0.9% FLUSH
10.0000 mL | Freq: Once | INTRAVENOUS | Status: AC
  Administered 2020-06-22: 10 mL via INTRAVENOUS
  Filled 2020-06-22: qty 10

## 2020-06-22 MED ORDER — HEPARIN SOD (PORK) LOCK FLUSH 100 UNIT/ML IV SOLN
INTRAVENOUS | Status: AC
  Filled 2020-06-22: qty 5

## 2020-06-22 MED ORDER — SODIUM CHLORIDE 0.9 % IV SOLN
Freq: Once | INTRAVENOUS | Status: AC
  Filled 2020-06-22: qty 250

## 2020-06-22 MED ORDER — SODIUM CHLORIDE 0.9 % IV SOLN
240.0000 mg | Freq: Once | INTRAVENOUS | Status: AC
  Administered 2020-06-22: 240 mg via INTRAVENOUS
  Filled 2020-06-22: qty 24

## 2020-06-22 MED ORDER — HEPARIN SOD (PORK) LOCK FLUSH 100 UNIT/ML IV SOLN
500.0000 [IU] | Freq: Once | INTRAVENOUS | Status: AC | PRN
  Administered 2020-06-22: 500 [IU]
  Filled 2020-06-22: qty 5

## 2020-06-22 NOTE — Progress Notes (Signed)
Patient denies new problems/concerns today.   °

## 2020-06-22 NOTE — Progress Notes (Signed)
Hematology/Oncology follow up  note [facility] Telephone:([phone] Fax:([phone]) [phone]   Patient Care Team: [facility] as PCP - General [name], MD as Consulting Physician (Oncology)  REFERRING PROVIDER: [facility]  CHIEF COMPLAINTS/REASON FOR VISIT:  Follow up for melanoma HISTORY OF PRESENTING ILLNESS:   Edward Pitts is a  60 y.o.  male with PMH listed below was seen in consultation at the request of  [facility]  for evaluation of inguinal mass Patient presented to emergency room 3 days ago complaining about left ing uinal mass discomfort. Reports that he has really noticed the mass growing for the past 1 months. He has a history of left lower extremity melanoma in 2011, status post local excision.  Pain was increased with squatting of laxation. He was advised to take Tylenol for pain. Denies any fever, chills, night sweating.  He does feel mild nauseated. Appetite is fair.  He has lost about 10 pounds since earlier this year. 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  Left inguinal and left iliac nodes which are suspicious for involvement.  There are also 2 small nonspecific hypodense lesions within the right liver, nonspecific.  # patient underwent left groin mass resection On 04/16/2019. Resection pathology showed malignant melanoma, replacing a lymph node, with extracapsular extension, peripheral and deep margins involved.  Left inguinal contents, all 7 lymph nodes were negative for melanoma in the lymph nodes.  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  # PDL1 80% TPS  # 04/16/2019. underwent left groin mass resection   07/07/2019  Status post adjuvant radiation and finished radiation  Patient has Mediport placed to facilitate immunotherapy treatments. INTERVAL  HISTORY Edward Pitts is a 60 y.o. male who has above history reviewed by me today presents for follow up visit for management of inguinal nodal recurrence of melanoma Problems and complaints are listed below: He continues to have hip and knee pain, last night he has severe pain which kept him from getting some sleep. He is scheduled to have hip surgery on 07/14/2020.  He previously has taken tramadol with relief.  His orthopedic surgeon does not want to prescribe him any narcotics.  Patient asks me  for refill of tramadol. Denies shortness of breath, rash, diarrhea.  Otherwise no new complaints.   . :Review of Systems  Constitutional: Negative for appetite change, chills, fatigue, fever and unexpected weight change.  HENT:   Negative for hearing loss and voice change.   Eyes: Negative for eye problems and icterus.  Respiratory: Negative for chest tightness, cough and shortness of breath.   Cardiovascular: Negative for chest pain and leg swelling.  Gastrointestinal: Negative for abdominal distention and abdominal pain.  Endocrine: Negative for hot flashes.  Genitourinary: Negative for difficulty urinating, dysuria and frequency.   Musculoskeletal: Positive for arthralgias.  Skin: Negative for itching and rash.       Skin hypo-pigmentation on upper extremities, no change  Neurological: Negative for light-headedness and numbness.  Hematological: Negative for adenopathy. Does not bruise/bleed easily.  Psychiatric/Behavioral: Negative for confusion.    MEDICAL HISTORY:  Past Medical History:  Diagnosis Date  . Anxiety   . Arthritis   . Complication of anesthesia   . Family history of adverse reaction to anesthesia    PONV mother  . Hyperlipidemia   . Hypertension   . Melanoma ([place]) 2012  .  PONV (postoperative nausea and vomiting) 04/16/2019  . Presence of permanent cardiac pacemaker    [employer]tr[employer]  . Seizures ([place])     SURGICAL HISTORY: Past Surgical History:  Procedure  Laterality Date  . KNEE SURGERY Left   . LEFT HEART CATH AND CORONARY ANGIOGRAPHY Left 06/29/2017   Procedure: LEFT HEART CATH AND CORONARY ANGIOGRAPHY;  Surgeon: [name], MD;  Location: [place] [facility];  Service: Cardiovascular;  Laterality: Left;  . LYMPH NODE DISSECTION Left 04/16/2019   Procedure: Left inguinal Lymph Node Dissection;  Surgeon: [name], MD;  Location: [place];  Service: General;  Laterality: Left;  Marland Kitchen MELANOMA EXCISION Left 04/16/2019   Procedure: MELANOMA EXCISION LEFT GROIN MASS;  Surgeon: [name], MD;  Location: [place];  Service: General;  Laterality: Left;  Marland Kitchen MELANOMA EXCISION WITH SENTINEL LYMPH NODE BIOPSY Left 2012   Left calf   . PACEMAKER INSERTION N/A 08/26/2018   Procedure: INSERTION PACEMAKER;  Surgeon: [name], MD;  Location: [facility];  Service: Cardiovascular;  Laterality: N/A;  . PORTA CATH INSERTION N/A 08/26/2019   Procedure: PORTA CATH INSERTION;  Surgeon: Katha Cabal, MD;  Location: [place] [facility];  Service: Cardiovascular;  Laterality: N/A;  . TEMPORARY PACEMAKER N/A 08/25/2018   Procedure: TEMPORARY PACEMAKER;  Surgeon: [name], MD;  Location: [place] [facility];  Service: Cardiovascular;  Laterality: N/A;    SOCIAL HISTORY: Social History   Socioeconomic History  . Marital status: Married    Spouse name: Vicente Males   . Number of children: 7  . Years of education: 1  . Highest education level: Not on file  Occupational History  . Occupation: [employer]   Tobacco Use  . Smoking status: Never Smoker  . Smokeless tobacco: Never Used  Vaping Use  . Vaping Use: Never used  Substance and Sexual Activity  . Alcohol use: No  . Drug use: No  . Sexual activity: Not on file  Other Topics Concern  . Not on file  Social History Narrative   Lives with mother, wife and sister   Caffeine use: sodas (2 per day)   Social Determinants of Health   Financial Resource Strain: Not on file  Food Insecurity: Not  on file  Transportation Needs: Not on file  Physical Activity: Not on file  Stress: Not on file  Social Connections: Not on file  Intimate Partner Violence: Not on file    FAMILY HISTORY: Family History  Problem Relation Age of Onset  . Cancer Paternal Grandmother     ALLERGIES:  is allergic to ibuprofen and nsaids.  MEDICATIONS:  Current Outpatient Medications  Medication Sig Dispense Refill  . acetaminophen (TYLENOL) 650 MG CR tablet Take 650 mg by mouth every 8 (eight) hours as needed for pain.     Marland Kitchen allopurinol (ZYLOPRIM) 300 MG tablet Take 300 mg by mouth daily.    Marland Kitchen aspirin 81 MG chewable tablet Chew 81 mg by mouth daily.    Marland Kitchen atorvastatin (LIPITOR) 20 MG tablet Take 20 mg by mouth daily.    . clonazePAM (KLONOPIN) 0.5 MG tablet 1 tablet in the morning, 2 in the evening 90 tablet 3  . diclofenac Sodium (VOLTAREN) 1 % GEL Apply 2 g topically 4 (four) times daily. 50 g 0  . hydrochlorothiazide (HYDRODIURIL) 25 MG tablet Take 1 tablet by mouth daily.    . hydrocortisone 2.5 % cream Apply topically daily as needed.    Marland Kitchen ketoconazole (NIZORAL) 2 % cream Apply 1  application topically daily as needed for irritation.    . lidocaine-prilocaine (EMLA) cream Apply 1 application topically as needed. Apply small amount of cream to port site approx 1-2 hours prior to appointment. 30 g 2  . lisinopril (ZESTRIL) 20 MG tablet Take 20 mg by mouth daily.    . metoprolol succinate (TOPROL-XL) 25 MG 24 hr tablet Take 25 mg by mouth daily.     . Multiple Vitamin (MULTIVITAMIN WITH MINERALS) TABS tablet Take 1 tablet by mouth daily. Centrum Silver    . ondansetron (ZOFRAN) 4 MG tablet TAKE 1 TABLET BY MOUTH EVERY 8 HOURS AS NEEDED FOR NAUSEA AND VOMITING 90 tablet 1  . silver sulfADIAZINE (SILVADENE) 1 % cream Apply 1 application topically 2 (two) times daily. (Patient not taking: No sig reported) 50 g 2   No current facility-administered medications for this visit.     PHYSICAL  EXAMINATION: ECOG PERFORMANCE STATUS: 1 - Symptomatic but completely ambulatory Vitals:   06/22/20 0837  BP: (!) 145/72  Pulse: 76  Resp: 16  Temp: (!) 96.8 F (36 C)   Filed Weights   06/22/20 0837  Weight: 236 lb 6.4 oz (107.2 kg)    Physical Exam Constitutional:      General: He is not in acute distress. HENT:     Head: Normocephalic and atraumatic.  Eyes:     General: No scleral icterus.    Pupils: Pupils are equal, round, and reactive to light.  Cardiovascular:     Rate and Rhythm: Normal rate and regular rhythm.     Heart sounds: Normal heart sounds.  Pulmonary:     Effort: Pulmonary effort is normal. No respiratory distress.     Breath sounds: No wheezing.  Abdominal:     General: Bowel sounds are normal. There is no distension.     Palpations: Abdomen is soft. There is no mass.     Tenderness: There is no abdominal tenderness.     Comments:  History left groin mass resection and radiation.   Musculoskeletal:        General: No deformity. Normal range of motion.     Cervical back: Normal range of motion and neck supple.     Comments: Left lower extremity edema  Skin:    General: Skin is warm and dry.     Findings: No erythema or rash.  Neurological:     Mental Status: He is alert and oriented to person, place, and time. Mental status is at baseline.     Cranial Nerves: No cranial nerve deficit.     Coordination: Coordination normal.  Psychiatric:        Mood and Affect: Mood normal.    LABORATORY DATA:  I have reviewed the data as listed Lab Results  Component Value Date   WBC 5.4 06/22/2020   HGB 12.1 (L) 06/22/2020   HCT 36.2 (L) 06/22/2020   MCV 91.0 06/22/2020   PLT 206 06/22/2020   Recent Labs    11/27/19 0833 12/11/19 0854 12/25/19 0905 01/08/20 0851 05/25/20 0818 06/08/20 0834 06/22/20 0812  NA 140 139 140   < > 138 138 136  K 4.1 3.8 4.4   < > 4.4 4.1 3.9  CL 106 104 106   < > 104 104 102  CO2 _0 < > _1 GLUCOSE  125* 118* 109*   < > 129* 133* 135*  BUN 20 25* 20   < > 22* 24* 22*  CREATININE 1.10 1.16 1.22   < > 1.15 1.17 1.06  CALCIUM 9.0 9.1 9.1   < > 9.3 9.1 9.1  GFRNONAA >60 >60 >60   < > >60 >60 >60  GFRAA >60 >60 >60  --   --   --   --   PROT 7.1 7.3 7.0   < > 7.2 7.0 7.2  ALBUMIN 4.0 4.2 4.1   < > 4.2 4.0 4.2  AST _0 < > _1 ALT 40 39 32   < > 39 39 37  ALKPHOS 89 80 80   < > 91 85 74  BILITOT 0.6 0.8 0.9   < > 0.6 0.6 0.6   < > = values in this interval not displayed.   Iron/TIBC/Ferritin/ %Sat    Component Value Date/Time   IRON 85 10/02/2019 0841   TIBC 342 10/02/2019 0841   FERRITIN 61 10/02/2019 0841   IRONPCTSAT 25 10/02/2019 0841      RADIOGRAPHIC STUDIES: I have personally reviewed the radiological images as listed and agreed with the findings in the report. CT CHEST ABDOMEN PELVIS W CONTRAST  Result Date: 03/31/2020 CLINICAL DATA:  Melanoma, excision from left calf 2012, recurrence to left inguinal lymph node 2021 EXAM: CT CHEST, ABDOMEN, AND PELVIS WITH CONTRAST TECHNIQUE: Multidetector CT imaging of the chest, abdomen and pelvis was performed following the standard protocol during bolus administration of intravenous contrast. CONTRAST:  148m OMNIPAQUE IOHEXOL 300 MG/ML SOLN, additional oral enteric contrast COMPARISON:  12/10/2019 FINDINGS: CT CHEST FINDINGS Cardiovascular: Right chest port catheter. Aortic atherosclerosis. Normal heart size. Left chest multi lead pacer. Scattered coronary artery calcifications. No pericardial effusion. Mediastinum/Nodes: No enlarged mediastinal, hilar, or axillary lymph nodes. Thyroid gland, trachea, and esophagus demonstrate no significant findings. Lungs/Pleura: Lungs are clear. No pleural effusion or pneumothorax. Musculoskeletal: No chest wall mass or suspicious bone lesions identified. CT ABDOMEN PELVIS FINDINGS Hepatobiliary: No solid liver abnormality is seen. Stable subcentimeter fluid attenuation lesion of the lateral  right lobe of the liver (series 2, image 54). No gallstones, gallbladder wall thickening, or biliary dilatation. Pancreas: Unremarkable. No pancreatic ductal dilatation or surrounding inflammatory changes. Spleen: Normal in size without significant abnormality. Adrenals/Urinary Tract: Adrenal glands are unremarkable. Kidneys are normal, without renal calculi, solid lesion, or hydronephrosis. Bladder is unremarkable. Stomach/Bowel: Stomach is within normal limits. Appendix appears normal. No evidence of bowel wall thickening, distention, or inflammatory changes. Vascular/Lymphatic: Aortic atherosclerosis. No enlarged abdominal or pelvic lymph nodes. Stable postoperative appearance of the left groin (series 2, image 123). Reproductive: No mass or other abnormality. Other: Fat containing umbilical hernia.  No abdominopelvic ascites. Musculoskeletal: No acute or significant osseous findings. IMPRESSION: 1. Stable postoperative appearance of the left groin. No evidence of local recurrence. 2. No evidence of metastatic disease within the chest, abdomen, or pelvis. 3. Stable subcentimeter fluid attenuation lesion of the lateral right lobe of the liver, almost certainly an incidental, benign cyst or hemangioma. 4. Coronary artery disease. Aortic Atherosclerosis (ICD10-I70.0). Electronically Signed   By: AEddie CandleM.D.   On: 03/31/2020 09:44       ASSESSMENT & PLAN:  1. Malignant melanoma of overlapping sites ([place]   2. Encounter for antineoplastic immunotherapy   3. Arthralgia, unspecified joint    #Left inguinal nodal recurrence of melanoma.  Status post resection and adjuvant radiation. Labs are reviewed and discussed with patient .  Proceed with nivolumab treatment today. Surveillance CT chest abdomen pelvis with contrast has been  scheduled in April 2022  # Left hip and knee arthritis-patient has elective hip procedure scheduled on July 14, 2020. Follow up with orthopedic surgeon. His pain is not  relieved by Tylenol or NSAIDs and symptoms are severe now which affect his daily activity and sleep. Immunotherapy may further worsening local inflammation of the arthritis. I recommend patient to take tramadol 50 mg every 12 hours as needed I sent him a 1 month supply.  Patient agrees that no refills will be given after his procedure.  He will need to ask orthopedic surgeon for pain medication to control post surgery pain. He will return in 2 weeks on 07/06/2020 for next treatment.  After that we will give him extra week or 2 to recover from his surgery before resuming immunotherapy.  #Vitiligo, stable #Normocytic anemia, mild, hemoglobin 12 stable. All questions were answered. The patient knows to call the clinic with any problems questions or concerns.  Follow-up with 2 weeks. We spent sufficient time to discuss many aspect of care, questions were answered to patient's satisfaction.   [name], MD, PhD Hematology Oncology [facility] Pager- [phone] 06/22/2020

## 2020-06-29 ENCOUNTER — Other Ambulatory Visit: Payer: Self-pay

## 2020-06-29 ENCOUNTER — Ambulatory Visit
Admission: RE | Admit: 2020-06-29 | Discharge: 2020-06-29 | Disposition: A | Discharge status: Home / Self Care | Source: Ambulatory Visit | Attending: Oncology | Admitting: Oncology

## 2020-06-29 DIAGNOSIS — C438 Malignant melanoma of overlapping sites of skin: Secondary | ICD-10-CM | POA: Insufficient documentation

## 2020-06-29 MED ORDER — IOHEXOL 300 MG/ML  SOLN
100.0000 mL | Freq: Once | INTRAMUSCULAR | Status: AC | PRN
  Administered 2020-06-29: 100 mL via INTRAVENOUS

## 2020-07-02 NOTE — Discharge Instructions (Signed)
Instructions after Total Hip Replacement     Raihana Balderrama P. Ariyana Faw, Jr., M.D.     Dept. of Orthopaedics & Sports Medicine  [facility]  [address]  [place], [place]  [address]  Phone: [phone]   Fax: [phone]    DIET: . Drink plenty of non-alcoholic fluids. . Resume your normal diet. Include foods high in fiber.  ACTIVITY:  . You may use crutches or a walker with weight-bearing as tolerated, unless instructed otherwise. . You may be weaned off of the walker or crutches by your Physical Therapist.  . Do NOT reach below the level of your knees or cross your legs until allowed.    . Continue doing gentle exercises. Exercising will reduce the pain and swelling, increase motion, and prevent muscle weakness.   . Please continue to use the TED compression stockings for 6 weeks. You may remove the stockings at night, but should reapply them in the morning. . Do not drive or operate any equipment until instructed.  WOUND CARE:  . Continue to use ice packs periodically to reduce pain and swelling. . Keep the incision clean and dry. . You may bathe or shower after the staples are removed at the first office visit following surgery.  MEDICATIONS: . You may resume your regular medications. . Please take the pain medication as prescribed on the medication. . Do not take pain medication on an empty stomach. . You have been given a prescription for a blood thinner to prevent blood clots. Please take the medication as instructed. (NOTE: After completing a 2 week course of Lovenox, take one Enteric-coated aspirin once a day.) . Pain medications and iron supplements can cause constipation. Use a stool softener (Senokot or Colace) on a daily basis and a laxative (dulcolax or miralax) as needed. . Do not drive or drink alcoholic beverages when taking pain medications.  CALL THE OFFICE FOR: . Temperature above 101 degrees . Excessive bleeding or drainage on the dressing. . Excessive  swelling, coldness, or paleness of the toes. . Persistent nausea and vomiting.  FOLLOW-UP:  . You should have an appointment to return to the office in 6 weeks after surgery. . Arrangements have been made for continuation of Physical Therapy (either home therapy or outpatient therapy).     [facility] Department Directory         [email or web address]       [email or web address]          Cardiology  Appointments: [place] - [phone] [place] - [phone]  Endocrinology  Appointments: [place] - [phone] [place] - [phone]  Gastroenterology  Appointments: [place] - [phone] [place] - [phone]        General Surgery   Appointments: [place] - [phone]  Internal Medicine/Family Medicine  Appointments: [place] - [phone] [place] - [phone] [place] - [phone]  Metabolic and Weigh Loss Surgery  Appointments: [place] - [phone]        Neurology  Appointments: [place] - [phone] [place] - [phone]  Neurosurgery  Appointments: [place] - [phone]  Obstetrics & Gynecology  Appointments: [place] - [phone] [place] - [phone]        Pediatrics  Appointments: [place] - [phone] [place] - [phone]  Physiatry  Appointments: [place] -[phone]  Physical Therapy  Appointments: [place] - [phone] [place] - [phone]        Podiatry  Appointments: [place] - [phone] [place] - [phone]  Pulmonology  Appointments: [place] - [phone]  Rheumatology  Appointments: [place] - [phone]        [place] Location: [facility]  [address] [place], [place]  [address]  [place] Location: [facility] [address] [place], [place]  [address]  [place] Location: [facility] [address] [place], [place]  [address]    

## 2020-07-06 ENCOUNTER — Inpatient Hospital Stay

## 2020-07-06 ENCOUNTER — Inpatient Hospital Stay (HOSPITAL_BASED_OUTPATIENT_CLINIC_OR_DEPARTMENT_OTHER): Admitting: Oncology

## 2020-07-06 ENCOUNTER — Inpatient Hospital Stay: Attending: Oncology

## 2020-07-06 ENCOUNTER — Encounter: Payer: Self-pay | Admitting: Oncology

## 2020-07-06 VITALS — BP 137/84 | HR 67 | Temp 97.4°F | Resp 18 | Wt 233.6 lb

## 2020-07-06 DIAGNOSIS — C4359 Malignant melanoma of other part of trunk: Secondary | ICD-10-CM | POA: Diagnosis present

## 2020-07-06 DIAGNOSIS — L80 Vitiligo: Secondary | ICD-10-CM | POA: Insufficient documentation

## 2020-07-06 DIAGNOSIS — C438 Malignant melanoma of overlapping sites of skin: Secondary | ICD-10-CM

## 2020-07-06 DIAGNOSIS — D649 Anemia, unspecified: Secondary | ICD-10-CM

## 2020-07-06 DIAGNOSIS — Z5112 Encounter for antineoplastic immunotherapy: Secondary | ICD-10-CM | POA: Diagnosis not present

## 2020-07-06 DIAGNOSIS — R7989 Other specified abnormal findings of blood chemistry: Secondary | ICD-10-CM

## 2020-07-06 DIAGNOSIS — M2550 Pain in unspecified joint: Secondary | ICD-10-CM

## 2020-07-06 DIAGNOSIS — Z79899 Other long term (current) drug therapy: Secondary | ICD-10-CM | POA: Diagnosis not present

## 2020-07-06 LAB — COMPREHENSIVE METABOLIC PANEL
ALT: 37 U/L (ref 0–44)
AST: 26 U/L (ref 15–41)
Albumin: 4.2 g/dL (ref 3.5–5.0)
Alkaline Phosphatase: 86 U/L (ref 38–126)
Anion gap: 10 (ref 5–15)
BUN: 23 mg/dL — ABNORMAL HIGH (ref 6–20)
CO2: 25 mmol/L (ref 22–32)
Calcium: 9.6 mg/dL (ref 8.9–10.3)
Chloride: 104 mmol/L (ref 98–111)
Creatinine, Ser: 1.4 mg/dL — ABNORMAL HIGH (ref 0.61–1.24)
GFR, Estimated: 58 mL/min — ABNORMAL LOW (ref >60)
Glucose, Bld: 116 mg/dL — ABNORMAL HIGH (ref 70–99)
Potassium: 4.2 mmol/L (ref 3.5–5.1)
Sodium: 139 mmol/L (ref 135–145)
Total Bilirubin: 0.7 mg/dL (ref 0.3–1.2)
Total Protein: 7.6 g/dL (ref 6.5–8.1)

## 2020-07-06 LAB — CBC WITH DIFFERENTIAL/PLATELET
Abs Immature Granulocytes: 0.05 10*3/uL (ref 0.00–0.07)
Basophils Absolute: 0 10*3/uL (ref 0.0–0.1)
Basophils Relative: 0 %
Eosinophils Absolute: 0.2 10*3/uL (ref 0.0–0.5)
Eosinophils Relative: 4 %
HCT: 36.6 % — ABNORMAL LOW (ref 39.0–52.0)
Hemoglobin: 12.2 g/dL — ABNORMAL LOW (ref 13.0–17.0)
Immature Granulocytes: 1 %
Lymphocytes Relative: 21 %
Lymphs Abs: 1.1 10*3/uL (ref 0.7–4.0)
MCH: 30.1 pg (ref 26.0–34.0)
MCHC: 33.3 g/dL (ref 30.0–36.0)
MCV: 90.4 fL (ref 80.0–100.0)
Monocytes Absolute: 0.4 10*3/uL (ref 0.1–1.0)
Monocytes Relative: 9 %
Neutro Abs: 3.3 10*3/uL (ref 1.7–7.7)
Neutrophils Relative %: 65 %
Platelets: 210 10*3/uL (ref 150–400)
RBC: 4.05 MIL/uL — ABNORMAL LOW (ref 4.22–5.81)
RDW: 13 % (ref 11.5–15.5)
WBC: 5.1 10*3/uL (ref 4.0–10.5)
nRBC: 0 % (ref 0.0–0.2)

## 2020-07-06 LAB — TSH: TSH: 3.5 u[IU]/mL (ref 0.350–4.500)

## 2020-07-06 MED ORDER — HEPARIN SOD (PORK) LOCK FLUSH 100 UNIT/ML IV SOLN
INTRAVENOUS | Status: AC
  Filled 2020-07-06: qty 5

## 2020-07-06 MED ORDER — HEPARIN SOD (PORK) LOCK FLUSH 100 UNIT/ML IV SOLN
500.0000 [IU] | Freq: Once | INTRAVENOUS | Status: AC | PRN
  Administered 2020-07-06: 500 [IU]
  Filled 2020-07-06: qty 5

## 2020-07-06 MED ORDER — OMEPRAZOLE 20 MG PO CPDR: 20.0000 mg | DELAYED_RELEASE_CAPSULE | Freq: Every day | ORAL | 1 refills | Status: DC

## 2020-07-06 MED ORDER — SODIUM CHLORIDE 0.9 % IV SOLN
240.0000 mg | Freq: Once | INTRAVENOUS | Status: AC
  Administered 2020-07-06: 240 mg via INTRAVENOUS
  Filled 2020-07-06: qty 24

## 2020-07-06 MED ORDER — HEPARIN SOD (PORK) LOCK FLUSH 100 UNIT/ML IV SOLN
500.0000 [IU] | Freq: Once | INTRAVENOUS | Status: DC
  Filled 2020-07-06: qty 5

## 2020-07-06 MED ORDER — SODIUM CHLORIDE 0.9% FLUSH
10.0000 mL | INTRAVENOUS | Status: DC | PRN
  Administered 2020-07-06: 10 mL
  Filled 2020-07-06: qty 10

## 2020-07-06 MED ORDER — SODIUM CHLORIDE 0.9 % IV SOLN
Freq: Once | INTRAVENOUS | Status: AC
Start: 2020-07-06 — End: 2020-07-06
  Filled 2020-07-06: qty 250

## 2020-07-06 MED ORDER — SODIUM CHLORIDE 0.9% FLUSH
10.0000 mL | Freq: Once | INTRAVENOUS | Status: AC
  Administered 2020-07-06: 10 mL via INTRAVENOUS
  Filled 2020-07-06: qty 10

## 2020-07-06 NOTE — Progress Notes (Signed)
Hematology/Oncology follow up  note [facility] Telephone:(336) [phone] Fax:([phone]) [phone]   Patient Care Team: [facility] as PCP - General [name], MD as Consulting Physician (Oncology)  REFERRING PROVIDER: [facility]*  CHIEF COMPLAINTS/REASON FOR VISIT:  Follow up for melanoma HISTORY OF PRESENTING ILLNESS:   Edward Pitts is a  60 y.o.  male with PMH listed below was seen in consultation at the request of  [facility]  for evaluation of inguinal mass Patient presented to emergency room 3 days ago complaining about left ing uinal mass discomfort. Reports that he has really noticed the mass growing for the past 1 months. He has a history of left lower extremity melanoma in 2011, status post local excision.  Pain was increased with squatting of laxation. He was advised to take Tylenol for pain. Denies any fever, chills, night sweating.  He does feel mild nauseated. Appetite is fair.  He has lost about 10 pounds since earlier this year. 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  Left inguinal and left iliac nodes which are suspicious for involvement.  There are also 2 small nonspecific hypodense lesions within the right liver, nonspecific.  # patient underwent left groin mass resection On 04/16/2019. Resection pathology showed malignant melanoma, replacing a lymph node, with extracapsular extension, peripheral and deep margins involved.  Left inguinal contents, all 7 lymph nodes were negative for melanoma in the lymph nodes.  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  # PDL1 80% TPS  # 04/16/2019. underwent left groin mass resection   07/07/2019  Status post adjuvant radiation and finished radiation  Patient has Mediport placed to facilitate immunotherapy treatments. INTERVAL  HISTORY Edward Pitts is a 60 y.o. male who has above history reviewed by me today presents for follow up visit for management of inguinal nodal recurrence of melanoma Problems and complaints are listed below: Patient takes tramadol as needed for persistent hip and knee pain. He has elective hip surgery scheduled next week. Reports bloating symptoms as well as acid reflux symptoms.   Denies shortness of breath, rash, diarrhea.  Otherwise no new complaints.   . :Review of Systems  Constitutional: Negative for appetite change, chills, fatigue, fever and unexpected weight change.  HENT:   Negative for hearing loss and voice change.   Eyes: Negative for eye problems and icterus ( ).  Respiratory: Negative for chest tightness, cough and shortness of breath.   Cardiovascular: Negative for chest pain and leg swelling.  Gastrointestinal: Negative for abdominal distention and abdominal pain.  Endocrine: Negative for hot flashes.  Genitourinary: Negative for difficulty urinating, dysuria and frequency.   Musculoskeletal: Positive for arthralgias.  Skin: Negative for itching and rash.       Skin hypo-pigmentation on upper extremities, no change  Neurological: Negative for light-headedness and numbness.  Hematological: Negative for adenopathy. Does not bruise/bleed easily.  Psychiatric/Behavioral: Negative for confusion.    MEDICAL HISTORY:  Past Medical History:  Diagnosis Date  . Anxiety   . Arthritis   . Complication of anesthesia   . Family history of adverse reaction to anesthesia    PONV mother  . Hyperlipidemia   . Hypertension   . Melanoma ([place]) 2012  . PONV (postoperative nausea and vomiting) 04/16/2019  . Presence of permanent cardiac pacemaker    [employer]  . Seizures ([place])     SURGICAL  HISTORY: Past Surgical History:  Procedure Laterality Date  . KNEE SURGERY Left   . LEFT HEART CATH AND CORONARY ANGIOGRAPHY Left 06/29/2017   Procedure: LEFT HEART CATH AND CORONARY  ANGIOGRAPHY;  Surgeon: [name], MD;  Location: [facility];  Service: Cardiovascular;  Laterality: Left;  . LYMPH NODE DISSECTION Left 04/16/2019   Procedure: Left inguinal Lymph Node Dissection;  Surgeon: [name], MD;  Location: [place];  Service: General;  Laterality: Left;  Marland Kitchen MELANOMA EXCISION Left 04/16/2019   Procedure: MELANOMA EXCISION LEFT GROIN MASS;  Surgeon: [name], MD;  Location: [place];  Service: General;  Laterality: Left;  Marland Kitchen MELANOMA EXCISION WITH SENTINEL LYMPH NODE BIOPSY Left 2012   Left calf   . PACEMAKER INSERTION N/A 08/26/2018   Procedure: INSERTION PACEMAKER;  Surgeon: [name], MD;  Location: [facility];  Service: Cardiovascular;  Laterality: N/A;  . PORTA CATH INSERTION N/A 08/26/2019   Procedure: PORTA CATH INSERTION;  Surgeon: Katha Cabal, MD;  Location: [facility];  Service: Cardiovascular;  Laterality: N/A;  . TEMPORARY PACEMAKER N/A 08/25/2018   Procedure: TEMPORARY PACEMAKER;  Surgeon: [name], MD;  Location: [facility];  Service: Cardiovascular;  Laterality: N/A;    SOCIAL HISTORY: Social History   Socioeconomic History  . Marital status: Married    Spouse name: Vicente Males   . Number of children: 7  . Years of education: 44  . Highest education level: Not on file  Occupational History  . Occupation: [employer]   Tobacco Use  . Smoking status: Never Smoker  . Smokeless tobacco: Never Used  Vaping Use  . Vaping Use: Never used  Substance and Sexual Activity  . Alcohol use: No  . Drug use: No  . Sexual activity: Not on file  Other Topics Concern  . Not on file  Social History Narrative   Lives with mother, wife and sister   Caffeine use: sodas (2 per day)   Social Determinants of Health   Financial Resource Strain: Not on file  Food Insecurity: Not on file  Transportation Needs: Not on file  Physical Activity: Not on file  Stress: Not on file  Social Connections: Not on file   Intimate Partner Violence: Not on file    FAMILY HISTORY: Family History  Problem Relation Age of Onset  . Cancer Paternal Grandmother     ALLERGIES:  is allergic to ibuprofen and nsaids.  MEDICATIONS:  Current Outpatient Medications  Medication Sig Dispense Refill  . allopurinol (ZYLOPRIM) 300 MG tablet Take 300 mg by mouth daily.    Marland Kitchen aspirin 81 MG chewable tablet Chew 81 mg by mouth daily.    Marland Kitchen atorvastatin (LIPITOR) 20 MG tablet Take 20 mg by mouth daily.    . clonazePAM (KLONOPIN) 0.5 MG tablet 1 tablet in the morning, 2 in the evening (Patient taking differently: Take 0.5 mg by mouth 2 (two) times daily. 1 tablet in the morning, 2 in the evening) 90 tablet 3  . lidocaine-prilocaine (EMLA) cream Apply 1 application topically as needed. Apply small amount of cream to port site approx 1-2 hours prior to appointment. 30 g 2  . lisinopril (ZESTRIL) 20 MG tablet Take 20 mg by mouth daily.    . metoprolol succinate (TOPROL-XL) 25 MG 24 hr tablet Take 25 mg by mouth daily.     . Multiple Vitamin (MULTIVITAMIN WITH MINERALS) TABS tablet Take 1 tablet by mouth daily. Centrum Silver    . omeprazole (PRILOSEC)  20 MG capsule Take 1 capsule (20 mg total) by mouth daily. 30 capsule 1  . ondansetron (ZOFRAN) 4 MG tablet TAKE 1 TABLET BY MOUTH EVERY 8 HOURS AS NEEDED FOR NAUSEA AND VOMITING (Patient taking differently: Take 4 mg by mouth every 8 (eight) hours as needed for vomiting or nausea.) 90 tablet 1  . silver sulfADIAZINE (SILVADENE) 1 % cream Apply 1 application topically 2 (two) times daily. 50 g 2  . traMADol (ULTRAM) 50 MG tablet Take 1 tablet (50 mg total) by mouth every 12 (twelve) hours as needed. 60 tablet 0  . diclofenac Sodium (VOLTAREN) 1 % GEL Apply 2 g topically 4 (four) times daily. (Patient not taking: No sig reported) 50 g 0   No current facility-administered medications for this visit.   Facility-Administered Medications Ordered in Other Visits  Medication Dose Route  Frequency Provider Last Rate Last Admin  . heparin lock flush 100 unit/mL  500 Units Intravenous Once Rickard Patience, MD      . sodium chloride flush (NS) 0.9 % injection 10 mL  10 mL Intracatheter PRN Rickard Patience, MD   10 mL at 07/06/20 1032     PHYSICAL EXAMINATION: ECOG PERFORMANCE STATUS: 1 - Symptomatic but completely ambulatory Vitals:   07/06/20 0946  BP: 137/84  Pulse: 67  Resp: 18  Temp: (!) 97.4 F (36.3 C)   Filed Weights   07/06/20 0946  Weight: 233 lb 9.6 oz (106 kg)    Physical Exam Constitutional:      General: He is not in acute distress. HENT:     Head: Normocephalic and atraumatic.  Eyes:     General: No scleral icterus.    Pupils: Pupils are equal, round, and reactive to light.  Cardiovascular:     Rate and Rhythm: Normal rate and regular rhythm.     Heart sounds: Normal heart sounds.  Pulmonary:     Effort: Pulmonary effort is normal. No respiratory distress.     Breath sounds: No wheezing.  Abdominal:     General: Bowel sounds are normal. There is no distension.     Palpations: Abdomen is soft. There is no mass.     Tenderness: There is no abdominal tenderness.     Comments:  History left groin mass resection and radiation.   Musculoskeletal:        General: No deformity. Normal range of motion.     Cervical back: Normal range of motion and neck supple.     Comments: Left lower extremity edema  Skin:    General: Skin is warm and dry.     Findings: No erythema or rash.  Neurological:     Mental Status: He is alert and oriented to person, place, and time. Mental status is at baseline.     Cranial Nerves: No cranial nerve deficit.     Coordination: Coordination normal.  Psychiatric:        Mood and Affect: Mood normal.    LABORATORY DATA:  I have reviewed the data as listed Lab Results  Component Value Date   WBC 5.1 07/06/2020   HGB 12.2 (L) 07/06/2020   HCT 36.6 (L) 07/06/2020   MCV 90.4 07/06/2020   PLT 210 07/06/2020   Recent Labs     11/27/19 0833 12/11/19 0854 12/25/19 0905 01/08/20 0851 06/08/20 0834 06/22/20 0812 07/06/20 0829  NA 140 139 140   < > 138 136 139  K 4.1 3.8 4.4   < > 4.1 3.9 4.2  CL 106 104 106   < > 104 102 104  CO2 $Re'24 27 27   'CLy$ < > $R'26 22 25  'Dy$ GLUCOSE 125* 118* 109*   < > 133* 135* 116*  BUN 20 25* 20   < > 24* 22* 23*  CREATININE 1.10 1.16 1.22   < > 1.17 1.06 1.40*  CALCIUM 9.0 9.1 9.1   < > 9.1 9.1 9.6  GFRNONAA >60 >60 >60   < > >60 >60 58*  GFRAA >60 >60 >60  --   --   --   --   PROT 7.1 7.3 7.0   < > 7.0 7.2 7.6  ALBUMIN 4.0 4.2 4.1   < > 4.0 4.2 4.2  AST $Re'29 28 26   'nOg$ < > $R'27 26 26  'Wk$ ALT 40 39 32   < > 39 37 37  ALKPHOS 89 80 80   < > 85 74 86  BILITOT 0.6 0.8 0.9   < > 0.6 0.6 0.7   < > = values in this interval not displayed.   Iron/TIBC/Ferritin/ %Sat    Component Value Date/Time   IRON 85 10/02/2019 0841   TIBC 342 10/02/2019 0841   FERRITIN 61 10/02/2019 0841   IRONPCTSAT 25 10/02/2019 0841      RADIOGRAPHIC STUDIES: I have personally reviewed the radiological images as listed and agreed with the findings in the report. CT CHEST ABDOMEN PELVIS W CONTRAST  Result Date: 06/30/2020 CLINICAL DATA:  Metastatic melanoma, left calf, metastatic to left inguinal lymph nodes,, status post resection and radiation therapy, ongoing immunotherapy EXAM: CT CHEST, ABDOMEN, AND PELVIS WITH CONTRAST TECHNIQUE: Multidetector CT imaging of the chest, abdomen and pelvis was performed following the standard protocol during bolus administration of intravenous contrast. CONTRAST:  159mL OMNIPAQUE IOHEXOL 300 MG/ML SOLN, additional oral enteric contrast COMPARISON:  03/31/2020 FINDINGS: CT CHEST FINDINGS Cardiovascular: Right chest port catheter. Left chest multi lead pacer. Normal heart size. Left coronary artery calcifications. No pericardial effusion. Mediastinum/Nodes: No enlarged mediastinal, hilar, or axillary lymph nodes. Thyroid gland, trachea, and esophagus demonstrate no significant findings.  Lungs/Pleura: Lungs are clear. No pleural effusion or pneumothorax. Musculoskeletal: No chest wall mass or suspicious bone lesions identified. CT ABDOMEN PELVIS FINDINGS Hepatobiliary: No solid liver abnormality is seen. Stable subcentimeter low-attenuation lesion of the peripheral right lobe of the liver (series 2, image 55). Hepatic steatosis. No gallstones, gallbladder wall thickening, or biliary dilatation. Pancreas: Unremarkable. No pancreatic ductal dilatation or surrounding inflammatory changes. Spleen: Normal in size without significant abnormality. Adrenals/Urinary Tract: Adrenal glands are unremarkable. Kidneys are normal, without renal calculi, solid lesion, or hydronephrosis. Bladder is unremarkable. Stomach/Bowel: Stomach is within normal limits. Appendix appears normal. No evidence of bowel wall thickening, distention, or inflammatory changes. Vascular/Lymphatic: Aortic atherosclerosis. No enlarged abdominal or pelvic lymph nodes. Unchanged findings of left inguinal lymph node resection with overlying skin thickening and fat stranding in keeping with radiation therapy. Reproductive: No mass or other abnormality. Other: Fat containing umbilical hernia.  No abdominopelvic ascites. Musculoskeletal: No acute or significant osseous findings. IMPRESSION: 1. Stable postoperative appearance of the left groin. No evidence of local recurrence. 2. No evidence of metastatic disease in the chest, abdomen, or pelvis. 3. Hepatic steatosis. 4. Stable subcentimeter fluid attenuation lesion of the lateral right lobe of the liver, again almost certainly an incidental, benign cyst or hemangioma. 5. Coronary artery disease. Aortic Atherosclerosis (ICD10-I70.0). Electronically Signed   By: Eddie Candle M.D.   On: 06/30/2020 09:20  ASSESSMENT & PLAN:  1. Malignant melanoma of overlapping sites ([place])   2. Encounter for antineoplastic immunotherapy   3. Arthralgia, unspecified joint   4. Anemia, unspecified type    5. Elevated serum creatinine    #Left inguinal nodal recurrence of melanoma.  Status post resection and adjuvant radiation. Labs were reviewed and discussed with patient Proceed with nivolumab treatment today. 06/29/2020, CT chest abdomen pelvis showed stable postoperative appearance of the left groin.  No evidence of local recurrence.  No evidence of metastatic disease in the chest abdomen or pelvis.  Hepatic steatosis.  Stable subcentimeter fluid attenuation lesion of the lateral right lobe of the liver, likely benign cyst or hemangioma.  Coronary artery disease.  Aortic atherosclerosis  # Left hip and knee arthritis- Patient has elective surgery scheduled with orthopedic surgeon next week. I discussed patient and he understands that his orthopedic surgeon will be in charge of the management of joint pain post surgery.  #Vitiligo, stable #Normocytic anemia, mild, hemoglobin 12.2 Elevated creatinine, encourage patient to increase oral hydration.  Avoid nephrotoxins. All questions were answered. The patient knows to call the clinic with any problems questions or concerns.  Follow-up on 07/27/2020  We spent sufficient time to discuss many aspect of care, questions were answered to patient's satisfaction.   [name], MD, PhD Hematology Oncology [facility] Pager- [phone] 07/06/2020

## 2020-07-06 NOTE — Progress Notes (Signed)
Patient denies new problems/concerns today.   °

## 2020-07-07 ENCOUNTER — Other Ambulatory Visit
Admission: RE | Admit: 2020-07-07 | Discharge: 2020-07-07 | Disposition: A | Discharge status: Home / Self Care | Source: Ambulatory Visit | Attending: Orthopedic Surgery | Admitting: Orthopedic Surgery

## 2020-07-07 ENCOUNTER — Other Ambulatory Visit: Payer: Self-pay

## 2020-07-07 DIAGNOSIS — Z01818 Encounter for other preprocedural examination: Secondary | ICD-10-CM | POA: Diagnosis present

## 2020-07-07 HISTORY — DX: Lymphedema, not elsewhere classified: I89.0

## 2020-07-07 HISTORY — DX: Sleep apnea, unspecified: G47.30

## 2020-07-07 HISTORY — DX: Atherosclerotic heart disease of native coronary artery without angina pectoris: I25.10

## 2020-07-07 HISTORY — DX: Anemia, unspecified: D64.9

## 2020-07-07 HISTORY — DX: Cardiac arrhythmia, unspecified: I49.9

## 2020-07-07 HISTORY — DX: Dizziness and giddiness: R42

## 2020-07-07 HISTORY — DX: Gastro-esophageal reflux disease without esophagitis: K21.9

## 2020-07-07 HISTORY — DX: Unspecified cataract: H26.9

## 2020-07-07 LAB — CBC WITH DIFFERENTIAL/PLATELET
Abs Immature Granulocytes: 0.05 10*3/uL (ref 0.00–0.07)
Basophils Absolute: 0 10*3/uL (ref 0.0–0.1)
Basophils Relative: 1 %
Eosinophils Absolute: 0.2 10*3/uL (ref 0.0–0.5)
Eosinophils Relative: 3 %
HCT: 35.3 % — ABNORMAL LOW (ref 39.0–52.0)
Hemoglobin: 11.7 g/dL — ABNORMAL LOW (ref 13.0–17.0)
Immature Granulocytes: 1 %
Lymphocytes Relative: 20 %
Lymphs Abs: 1.1 10*3/uL (ref 0.7–4.0)
MCH: 29.6 pg (ref 26.0–34.0)
MCHC: 33.1 g/dL (ref 30.0–36.0)
MCV: 89.4 fL (ref 80.0–100.0)
Monocytes Absolute: 0.6 10*3/uL (ref 0.1–1.0)
Monocytes Relative: 10 %
Neutro Abs: 3.7 10*3/uL (ref 1.7–7.7)
Neutrophils Relative %: 65 %
Platelets: 206 10*3/uL (ref 150–400)
RBC: 3.95 MIL/uL — ABNORMAL LOW (ref 4.22–5.81)
RDW: 12.8 % (ref 11.5–15.5)
WBC: 5.7 10*3/uL (ref 4.0–10.5)
nRBC: 0 % (ref 0.0–0.2)

## 2020-07-07 LAB — PROTIME-INR
INR: 1 (ref 0.8–1.2)
Prothrombin Time: 13.2 seconds (ref 11.4–15.2)

## 2020-07-07 LAB — COMPREHENSIVE METABOLIC PANEL
ALT: 32 U/L (ref 0–44)
AST: 26 U/L (ref 15–41)
Albumin: 4.1 g/dL (ref 3.5–5.0)
Alkaline Phosphatase: 92 U/L (ref 38–126)
Anion gap: 9 (ref 5–15)
BUN: 29 mg/dL — ABNORMAL HIGH (ref 6–20)
CO2: 25 mmol/L (ref 22–32)
Calcium: 9.7 mg/dL (ref 8.9–10.3)
Chloride: 103 mmol/L (ref 98–111)
Creatinine, Ser: 1.16 mg/dL (ref 0.61–1.24)
GFR, Estimated: >60 mL/min (ref >60)
Glucose, Bld: 98 mg/dL (ref 70–99)
Potassium: 4.3 mmol/L (ref 3.5–5.1)
Sodium: 137 mmol/L (ref 135–145)
Total Bilirubin: 0.8 mg/dL (ref 0.3–1.2)
Total Protein: 7.6 g/dL (ref 6.5–8.1)

## 2020-07-07 LAB — SURGICAL PCR SCREEN
MRSA, PCR: NEGATIVE
Staphylococcus aureus: NEGATIVE

## 2020-07-07 LAB — URINALYSIS, ROUTINE W REFLEX MICROSCOPIC
Bilirubin Urine: NEGATIVE
Glucose, UA: NEGATIVE mg/dL
Hgb urine dipstick: NEGATIVE
Ketones, ur: NEGATIVE mg/dL
Leukocytes,Ua: NEGATIVE
Nitrite: NEGATIVE
Protein, ur: NEGATIVE mg/dL
Specific Gravity, Urine: 1.014 (ref 1.005–1.030)
pH: 5 (ref 5.0–8.0)

## 2020-07-07 LAB — APTT: aPTT: 28 seconds (ref 24–36)

## 2020-07-07 LAB — SEDIMENTATION RATE: Sed Rate: 45 mm/hr — ABNORMAL HIGH (ref 0–20)

## 2020-07-07 LAB — TYPE AND SCREEN
ABO/RH(D): A POS
Antibody Screen: NEGATIVE

## 2020-07-07 LAB — C-REACTIVE PROTEIN: CRP: 1.6 mg/dL — ABNORMAL HIGH (ref <1.0)

## 2020-07-07 NOTE — Patient Instructions (Addendum)
INSTRUCTIONS FOR SURGERY     Your surgery is scheduled for:   Wednesday, April 20TH     To find out your arrival time for the day of surgery,          please call [phone] between 1 pm and 3 pm on :  Tuesday, April 19TH     When you arrive for surgery, report to the [facility]. ONCE      REGISTRATION HAS COMPLETED THEIR PROCESS, YOU WILL GO TO THE SECOND     FLOOR SURGERY DESK.     REMEMBER: Instructions that are not followed completely may result in serious medical risk,  up to and including death, or upon the discretion of your surgeon and anesthesiologist,            your surgery may need to be rescheduled.  __X__ 1. Do not eat food after midnight the night before your procedure.                    No gum, candy, lozenger, tic tacs, tums or hard candies.                  ABSOLUTELY NOTHING SOLID IN YOUR MOUTH AFTER MIDNIGHT                    You may drink unlimited clear liquids up to 2 hours before you are scheduled to arrive for surgery.                   Do not drink anything within those 2 hours unless you need to take medicine, then take the                   smallest amount you need.  Clear liquids include:  water, apple juice without pulp,                   any flavor Gatorade, Black coffee, black tea.  Sugar may be added but no dairy/ honey /lemon.                        Broth and jello is not considered a clear liquid.  __x__  2. On the morning of surgery, please brush your teeth with toothpaste and water. You may rinse with                  mouthwash if you wish but DO NOT SWALLOW TOOTHPASTE OR MOUTHWASH  __X___3. NO alcohol for 24 hours before or after surgery.  __x___ 4.  Do NOT smoke or use e-cigarettes for 24 HOURS PRIOR TO SURGERY.                      DO NOT Use any chewable tobacco products for at least 6 hours prior to surgery.  __x___ 5. If you start any new medication after this  appointment and prior to surgery, please                   Bring it with you on the day of surgery.  ___x__  6. Notify your doctor if there is any change in your medical condition, such as fever, infection, vomitting,                   Diarrhea or any open sores.  __x___ 7.  USE the CHG SOAP as instructed, the night before surgery and the day of surgery.                   Once you have washed with this soap, do NOT use any of the following: Powders, perfumes                    or lotions. Please do not wear make up, hairpins, clips or nail polish. You MAY  wear deodorant.                   Men may shave their face and neck.  Women need to shave 48 hours prior to surgery.                   DO NOT wear ANY jewelry on the day of surgery. If there are rings that are too tight to                    remove easily, please address this prior to the surgery day. Piercings need to be removed.                                                                     NO METAL ON YOUR BODY.                    Do NOT bring any valuables.  If you came to Pre-Admit testing then you will not need license,                     insurance card or credit card.  If you will be staying overnight, please either leave your things in                     the car or have your family be responsible for these items.                     [facility] IS NOT RESPONSIBLE FOR BELONGINGS OR VALUABLES.  ___X__ 8. DO NOT wear contact lenses on surgery day.  You may not have dentures,                     Hearing aides, contacts or glasses in the operating room. These items can be                    Placed in the Recovery Room to receive immediately after surgery.  __x___ 9. IF YOU ARE SCHEDULED TO GO HOME ON THE SAME DAY, YOU MUST                   Have someone to drive you home and to stay with you  for the first 24 hours.                    Have an arrangement prior to arriving  on surgery day.  ___x__ 10. Take the following medications  on the morning of surgery with a sip of water:                              1. METOPROLOL                     2. LISINOPRIL                     3. ALLOPURINOL                     4. CLONAZEPAM, extra if needed                     5. ATORVASTATIN                     6. OMEPRAZOLE/PRILOSEC(take an extra dose the night before surgery)  ___x__ 11.  Follow any instructions provided to you by your surgeon.                        Such as enema, clear liquid bowel prep                       PLEASE COMPLETE THE PRESURGICAL DRINK BY 2 HOURS PRIOR TO                            SURGERY.  __X__  12. STOP ASPIRIN 4 DAYS PRIOR TO SURGERY. LAST DOSE ON SAT, April 16TH.                       THIS INCLUDES BC POWDERS / GOODIES POWDER  __x___ 13. STOP Anti-inflammatories as of TODAY.                      This includes IBUPROFEN / MOTRIN / ADVIL / ALEVE/ NAPROXYN                    YOU MAY TAKE TYLENOL ANY TIME PRIOR TO SURGERY.  __X___ 8.  Stop supplements until after surgery.                     This includes: MULTIVITAMINS.                 __X___ 15. Bring your CPAP machine into preop with you on the morning of surgery.  ___X___18. If staying overnight, please have appropriate shoes to wear to be able to walk around the unit.                   Wear clean and comfortable clothing to the hospital.  [place] PHONE AND CHARGER. BRING PHONE NUMBERS FOR ALL YOUR CONTACT PEOPLE.  HAVE STOOL SOFTENERS FOR USE AT HOME. BRING THE ADVANCE DIRECTIVES BOOKLET WITH YOU IF IT HAS BEEN NOTARIZED.   CALL [facility] TEST[facility] AT [phone] WITH VACCINE INFORMATION.  You may take tramadol and zofran at any time prior to surgery. Various stool softener suggestions:    miralax    Colace/ docusate sodium

## 2020-07-07 NOTE — Pre-Procedure Instructions (Signed)
Patient ambulatory to preadmit testing. Tends to shuffle more than taking specific steps. 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  Patient has recently had his pacemaker evaluated.  Requests staff to use port for his operating room iv if possible.  He will return a phone call to provide updated information regarding his 2 vaccines.  Also, his wife speaks minimal [place] and will require a Optometrist.

## 2020-07-08 LAB — URINE CULTURE
Culture: NO GROWTH
Special Requests: NORMAL

## 2020-07-11 NOTE — H&P (Signed)
ORTHOPAEDIC HISTORY & PHYSICAL Gwenlyn Fudge, [place] - 07/07/2020 3:15 PM EDT Formatting of this note is different from the original. [facility] Chief Complaint:   Chief Complaint  Patient presents with  . Hip Pain  H & P LEFT HIP   History of Present Illness:   Edward Pitts is a 60 y.o. male that presents to clinic today for his preoperative history and evaluation. Patient presents unaccompanied. The patient is scheduled to undergo a left total hip arthroplasty on 07/14/20 by Dr. Marry Guan. His pain began 7 months ago. The pain is located in the right hip and thigh. He describes his pain as worse with weightbearing. He reports associated decrease in range of motion as well as difficulty putting on removing shoes and socks. He denies associated numbness or tingling.   Patient has received cardiac clearance. Does have history of pacemaker placement in 2020 and cardiac catheterization in 2019. Denies history of lumbar surgery, blood clots.   Of note, patient does have history of local excision of melanoma from the left lower extremity in 2011. In 2021 he underwent local excision of left groin mass which was also consistent with melanoma. He has completed radiation treatment and is currently undergoing immunotherapy with nivolumab. Patient states his next treatment is not scheduled until May 3.  Past Medical, Surgical, Family, Social History, Allergies, Medications:   Past Medical History:  Past Medical History:  Diagnosis Date  . Chicken pox  . Gout  . History of cancer 01/2019  . Hyperlipidemia  . Hypertension  . Skin cancer (melanoma) (CMS-HCC) 04/16/2019  . Stable angina (CMS-HCC)   Past Surgical History:  Past Surgical History:  Procedure Laterality Date  . heart pacemaker 07/2018  . L knee surgery Left  . LEFT GROIN MELANOMA REMOVED Left 04/17/2019  . left heart cath and coronary angiography Left 06/29/2017   Current  Medications:  Current Outpatient Medications  Medication Sig Dispense Refill  . allopurinoL (ZYLOPRIM) 300 MG tablet One tab daily QD, 90 days 90 tablet 1  . aspirin 81 MG chewable tablet Take 1 tablet by mouth once daily  . atorvastatin (LIPITOR) 20 MG tablet Take 1 tablet (20 mg total) by mouth once daily 90 tablet 3  . clonazePAM (KLONOPIN) 0.5 MG tablet Take 3 tablets by mouth once daily 1 tablet in the morning and 2 tablet in the pm if needed 3  . hydrocortisone 0.5 % cream Apply 1 Application topically once daily  . ketoconazole (NIZORAL) 2 % cream Apply topically 2 (two) times daily as needed  . lidocaine-prilocaine (EMLA) cream Apply topically as needed (for blood draws)  . lisinopriL (ZESTRIL) 20 MG tablet TAKE 1 TABLET BY MOUTH EVERY DAY 90 tablet 3  . metoprolol succinate (TOPROL-XL) 25 MG XL tablet TAKE 1 TABLET BY MOUTH EVERY DAY 90 tablet 3  . multivit with minerals/lutein (MULTIVITAMIN 50 PLUS ORAL) Take 1 tablet by mouth once daily  . omeprazole (PRILOSEC) 20 MG DR capsule 20 mg 2 (two) times daily before meals  . ondansetron (ZOFRAN) 4 MG tablet Take 4 mg by mouth 2 (two) times daily  . traMADoL (ULTRAM) 50 mg tablet Take 50 mg by mouth every 12 (twelve) hours as needed  . varicella virus vaccine, recombinant, (SHINGRIX, PF,) IM injection  . ciclopirox (PENLAC) 8 % topical nail solution ciclopirox 8 % topical solution APP QD TO NAILS (Patient not taking: Reported on 07/07/2020)  . diclofenac (VOLTAREN) 1 % topical gel 2  g 2 (two) times daily as needed (Patient not taking: Reported on 07/07/2020)   No current facility-administered medications for this visit.   Allergies:  Allergies  Allergen Reactions  . Nsaids (Non-Steroidal Anti-Inflammatory Drug) Kidney Disorder  CKD Stage A   Social History:  Social History   Socioeconomic History  . Marital status: Married  Spouse name: Shellia Carwin  . Number of children: 0  . Years of education: 14  . Highest education level:  High school graduate  Occupational History  . 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 Smoking status: Never Smoker  . Smokeless tobacco: Never Used  Vaping Use  . Vaping Use: Never used  Substance and Sexual Activity  . Alcohol use: Not Currently  . Drug use: Never  . Sexual activity: Yes  Partners: Female   Family History:  Family History  Problem Relation Age of Onset  . Arthritis Mother  . High blood pressure (Hypertension) Mother  . Heart disease Mother  . Arthritis Sister  . Asthma Sister  . Heart disease Sister  . Arthritis Brother  . Diabetes type II Father  . Stroke Father   Review of Systems:   A 10+ ROS was performed, reviewed, and the pertinent orthopaedic findings are documented in the HPI.   Physical Examination:   BP (!) 140/80 (BP Location: Left upper arm, Patient Position: Sitting, BP Cuff Size: Adult)  Ht 170.2 cm (5\' 7" )  Wt (!) 105.9 kg (233 lb 6.4 oz)  BMI 36.56 kg/m   Patient is a well-developed, well-nourished male in no acute distress. Patient has normal mood and affect. Patient is alert and oriented to person, place, and time.   HEENT: Atraumatic, normocephalic. Pupils equal and reactive to light. Extraocular motion intact. Noninjected sclera.  Cardiovascular: Regular rate and rhythm, with no murmurs, rubs, or gallops. Distal pulses palpable. No bruits.  Respiratory: Lungs clear to auscultation bilaterally.   Left Hip: Pelvic tilt: Negative Limb lengths: Equal with the patient standing Soft tissue swelling: Negative Erythema: Negative Crepitance: Negative Tenderness: Greater trochanter is nontender to palpation. Moderate pain is elicited by axial compression or extremes of rotation. Atrophy: No atrophy. Good hip flexor and abductor strength. Range of Motion: EXT/FLEX: 0/0/100 ADD/ABD: 20/0/20 IR/ER: 20/0/20  Sensation is intact over the saphenous, lateral cutaneous, superficial  fibular, and deep fibular nerve distributions.  Tests Performed/Reviewed:  X-rays  Anteroposterior view of the pelvis as well as anteroposterior and lateral views of the left hip were obtained. Images reveal severe loss of femoral acetabular joint space with osteophyte formation noted. No fractures or dislocations noted.  I personally ordered and interpreted today's radiographs.  Impression:   ICD-10-CM  1. Primary osteoarthritis of left hip M16.12   Plan:   The patient has end-stage degenerative changes of the left hip. It was explained to the patient that the condition is progressive in nature. Having failed conservative treatment, the patient has elected to proceed with a total joint arthroplasty. The patient will undergo a total joint arthroplasty with Dr. Marry Guan. The risks of surgery, including blood clot and infection, were discussed with the patient. Measures to reduce these risks, including the use of anticoagulation, perioperative antibiotics, and early ambulation were discussed. The importance of postoperative physical therapy was discussed with the patient. The patient elects to proceed with surgery. The patient is instructed to stop all blood thinners prior to surgery. The patient is instructed to call the hospital the  day before surgery to learn of the proper arrival time.   Contact our office with any questions or concerns. Follow up as indicated, or sooner should any new problems arise, if conditions worsen, or if they are otherwise concerned.   Gwenlyn Fudge, PA-C [facility] [place], [place] [address] Phone: [phone]  This note was generated in part with voice recognition software and I apologize for any typographical errors that were not detected and corrected.  Electronically signed by Gwenlyn Fudge, PA at 07/07/2020 6:36 PM EDT

## 2020-07-12 ENCOUNTER — Other Ambulatory Visit: Payer: Self-pay

## 2020-07-12 ENCOUNTER — Encounter: Payer: Self-pay | Admitting: Orthopedic Surgery

## 2020-07-12 ENCOUNTER — Other Ambulatory Visit
Admission: RE | Admit: 2020-07-12 | Discharge: 2020-07-12 | Disposition: A | Discharge status: Home / Self Care | Source: Ambulatory Visit | Attending: Orthopedic Surgery | Admitting: Orthopedic Surgery

## 2020-07-12 ENCOUNTER — Other Ambulatory Visit: Payer: Medicaid Other

## 2020-07-12 DIAGNOSIS — Z20822 Contact with and (suspected) exposure to covid-19: Secondary | ICD-10-CM | POA: Insufficient documentation

## 2020-07-12 DIAGNOSIS — Z01812 Encounter for preprocedural laboratory examination: Secondary | ICD-10-CM | POA: Insufficient documentation

## 2020-07-12 LAB — SARS CORONAVIRUS 2 (TAT 6-24 HRS): SARS Coronavirus 2: NEGATIVE

## 2020-07-12 NOTE — Progress Notes (Signed)
Perioperative Services  Pre-Admission/Anesthesia Testing Clinical Review  Date: 07/12/20  Patient Demographics:  Name: Edward Pitts DOB:   1961/02/26 MRN:   [id number]  Planned Surgical Procedure(s):    Case: [id number] Date/Time: 07/14/20 0815   Procedure: TOTAL HIP ARTHROPLASTY (Left Hip)   Anesthesia type: Choice   Pre-op diagnosis: PRIMARY OSTEOARHTRITIS OF LEFT HIP.   Location: [facility] OR ROOM 01 / [place] [facility]S FOR ANESTHESIA GROUP   Surgeons: Edward Leep, MD    NOTE: Available PAT nursing documentation and vital signs have been reviewed. Clinical nursing staff has updated patient's PMH/PSHx, current medication list, and drug allergies/intolerances to ensure comprehensive history available to assist in medical decision making as it pertains to the aforementioned surgical procedure and anticipated anesthetic course.   Clinical Discussion:  Edward Pitts is a 60 y.o. male who is submitted for pre-surgical anesthesia review and clearance prior to him undergoing the above procedure. Patient has never been a smoker. Pertinent PMH includes: CAD, complete heart block (s/p PPM placement), LBBB, aortic atherosclerosis, HTN, HLD, OSAH (requires nocturnal PAP therapy), GERD (on daily PPI), seizures, anemia, melanoma, OA.   Patient is followed by cardiology Edward Massed, MD). He was last seen in the cardiology clinic on 04/26/2020; notes reviewed.  At the time of his clinic visit, patient noted to be doing well overall from a cardiovascular perspective. He denied chest pain, shortness of breath, orthopnea, PND, palpitations, peripheral edema, vertiginous symptoms, and presyncope/syncope.  He only complained of orthopedic pain.  PMH significant for ASCVD and complete heart block.  Patient has a PPM in place; he is not pacer dependent; last interrogation was on 05/11/2020.     Myocardial perfusion imaging study performed on 06/21/2017 revealed no evidence of stress-induced myocardial ischemia or arrhythmia.      In the setting of progressive class III anginal symptoms, cardiac risk factors, and new onset LBBB, patient underwent diagnostic heart catheterization on 06/29/2017.  Study revealed mild two-vessel CAD with 35% stenosis of the ostial LM to distal LM and 30% stenosis of the mid RCA.  Medical management recommended.   TTE at that time (06/2017) revealed mild left ventricular systolic dysfunction with an EF of 45-50%.     Patient progressed to third-degree heart block necessitating PPM placement, which was performed on 08/26/2018.  Postprocedural TTE revealed normal left ventricular systolic function with an EF of 60-65%.   Last TTE performed on 04/23/2019 revealed mild left ventricular systolic dysfunction with trivial valvular insufficiency; LVEF 45% (see full interpretation of cardiovascular testing below).  Patient on GDMT for his HTN and HLD diagnoses.  Blood pressure moderately controlled at 142/76 on currently prescribed ACEi and beta-blocker therapies.  Patient is on a statin for his HLD. Functional capacity, as defined by DASI, is documented as being </= 4 METS; again, felt to be limited due to orthopedic pain.  No changes were made to patient's medication regimen.  Patient to follow-up with outpatient cardiology in 9 months or sooner if needed.  Patient is scheduled to undergo an elective orthopedic procedure on 07/14/2020 with Dr. Skip Pitts. Given patient's past medical history significant for cardiovascular diagnoses and intervention, presurgical cardiac clearance was sought by the performing surgeon's office and PAT team.  Per cardiology, "the patient is at the lowest risk possible for possible cardiovascular complications with surgical intervention.  The overall risk of cardiac complications with the surgery are felt to be low (<1%), therefore patient may proceed as planned".  This patient is on daily antiplatelet therapy. He has been  instructed on recommendations for holding his  daily low-dose ASA for 4 days prior to his procedure with plans to restart as soon as postoperative bleeding risk felt to be minimized by his attending surgeon. The patient has been instructed that his last dose of his anticoagulant will be on 07/09/2020.  Patient reports previous perioperative complications with anesthesia in the past. PMH (+) for PONV.  In review of the available records, it is noted that patient underwent a general anesthetic course at [facility] in 03/2019 without documented complications.   Vitals with BMI 07/07/2020 07/06/2020 06/22/2020  Height 5\' 7"  - -  Weight 233 lbs 233 lbs 10 oz 236 lbs 6 oz  BMI 01.74 - -  Systolic 944 967 591  Diastolic 93 84 72  Pulse 68 67 76    Providers/Specialists:   NOTE: Primary physician provider listed below. Patient may have been seen by APP or partner within same practice.   PROVIDER ROLE / SPECIALTY LAST OV  Pitts, Edward Record, MD  Orthopedics (Surgeon)  07/07/2020  [facility]  Primary Care Provider  ???  Edward Royals, MD   Cardiology  04/26/2020  Edward Server, MD  Oncology  07/06/2020   Allergies:  Ibuprofen and Nsaids  Current Home Medications:   No current facility-administered medications for this encounter.   Marland Kitchen allopurinol (ZYLOPRIM) 300 MG tablet  . aspirin 81 MG chewable tablet  . atorvastatin (LIPITOR) 20 MG tablet  . clonazePAM (KLONOPIN) 0.5 MG tablet  . lidocaine-prilocaine (EMLA) cream  . lisinopril (ZESTRIL) 20 MG tablet  . metoprolol succinate (TOPROL-XL) 25 MG 24 hr tablet  . Multiple Vitamin (MULTIVITAMIN WITH MINERALS) TABS tablet  . ondansetron (ZOFRAN) 4 MG tablet  . traMADol (ULTRAM) 50 MG tablet  . diclofenac Sodium (VOLTAREN) 1 % GEL  . hydrocortisone 2.5 % ointment  . ketoconazole (NIZORAL) 2 % cream  . omeprazole (PRILOSEC) 20 MG capsule  . silver sulfADIAZINE (SILVADENE) 1 % cream   History:   Past Medical History:  Diagnosis Date  . Anemia    iron  treatments  . Anxiety   . Aortic atherosclerosis ([place])   . Arthritis   . 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 Cataract   . Complication of anesthesia    PONV  . Coronary artery disease   . Dizziness of unknown etiology    has led to seizures and passing out.  . Family history of adverse reaction to anesthesia    PONV mother  . GERD (gastroesophageal reflux disease)   . History of complete heart block    PPM placed  . Hyperlipidemia   . Hypertension   . LBBB (left bundle branch block)   . Lymphedema of left leg    uses thigh high compression stockings  . Melanoma ([place]) 2012   skin cancer, left thigh  . OSA on CPAP   . PONV (postoperative nausea and vomiting) 04/16/2019  . Port-A-Cath in place    RIGHT chest wall  . Presence of cardiac pacemaker    [employer]tronic  . Seizures ([place])    still has episodes of dizziness. last event 1 month ago (march 2022) and will pass out. takes clonazepam   Past Surgical History:  Procedure Laterality Date  . CT RADIATION THERAPY GUIDE     left groin  . dental implant     permanent implant  . KNEE SURGERY Left    arthroscopy  . LEFT HEART CATH  AND CORONARY ANGIOGRAPHY Left 06/29/2017   Procedure: LEFT HEART CATH AND CORONARY ANGIOGRAPHY;  Surgeon: [name], MD;  Location: [place] [facility];  Service: Cardiovascular;  Laterality: Left;  . LYMPH NODE DISSECTION Left 04/16/2019   Procedure: Left inguinal Lymph Node Dissection;  Surgeon: [name], MD;  Location: [place];  Service: General;  Laterality: Left;  Marland Kitchen MELANOMA EXCISION Left 04/16/2019   Procedure: MELANOMA EXCISION LEFT GROIN MASS;  Surgeon: [name], MD;  Location: [place];  Service: General;  Laterality: Left;  Marland Kitchen MELANOMA EXCISION WITH SENTINEL LYMPH NODE BIOPSY Left 2012   Left calf   . PACEMAKER INSERTION N/A 08/26/2018   Procedure: INSERTION PACEMAKER;  Surgeon: [name], MD;  Location: [facility] ORS;  Service: Cardiovascular;   Laterality: N/A;  . PORTA CATH INSERTION N/A 08/26/2019   Procedure: PORTA CATH INSERTION;  Surgeon: Katha Cabal, MD;  Location: [place] [facility];  Service: Cardiovascular;  Laterality: N/A;  . SUPERFICIAL LYMPH NODE BIOPSY / EXCISION Left 2020   lymph nodes removed around left groin melanoma site  . TEMPORARY PACEMAKER N/A 08/25/2018   Procedure: TEMPORARY PACEMAKER;  Surgeon: [name], MD;  Location: [place] [facility];  Service: Cardiovascular;  Laterality: N/A;   Family History  Problem Relation Age of Onset  . Cancer Paternal Grandmother    Social History   Tobacco Use  . Smoking status: Never Smoker  . Smokeless tobacco: Never Used  Vaping Use  . Vaping Use: Never used  Substance Use Topics  . Alcohol use: No  . Drug use: No    Pertinent Clinical Results:  LABS: Labs reviewed: Acceptable for surgery.  No visits with results within 3 Day(s) from this visit.  Latest known visit with results is:  Hospital Outpatient Visit on 07/07/2020  Component Date Value Ref Range Status  . CRP 07/07/2020 1.6* <1.0 mg/dL Final   Performed at [facility] [address] [place], [place] [address]  . MRSA, PCR 07/07/2020 NEGATIVE  NEGATIVE Final  . Staphylococcus aureus 07/07/2020 NEGATIVE  NEGATIVE Final   Comment: (NOTE) The Xpert SA Assay (FDA approved for NASAL specimens in patients 67 years of age and older), is one component of a comprehensive surveillance program. It is not intended to diagnose infection nor to guide or monitor treatment. Performed at [facility], [address] [place], [place] [address]   . Sed Rate 07/07/2020 45* 0 - 20 mm/hr Final   Performed at [facility], [address] [place], [place] [address]  . Sodium 07/07/2020 137  135 - 145 mmol/L Final  . Potassium 07/07/2020 4.3  3.5 - 5.1 mmol/L Final  . Chloride 07/07/2020 103  98 - 111 mmol/L Final  . CO2 07/07/2020 25  22 - 32 mmol/L Final  . Glucose, Bld  07/07/2020 98  70 - 99 mg/dL Final   Glucose reference range applies only to samples taken after fasting for at least 8 hours.  . BUN 07/07/2020 29* 6 - 20 mg/dL Final  . Creatinine, Ser 07/07/2020 1.16  0.61 - 1.24 mg/dL Final  . Calcium 07/07/2020 9.7  8.9 - 10.3 mg/dL Final  . Total Protein 07/07/2020 7.6  6.5 - 8.1 g/dL Final  . Albumin 07/07/2020 4.1  3.5 - 5.0 g/dL Final  . AST 07/07/2020 26  15 - 41 U/L Final  . ALT 07/07/2020 32  0 - 44 U/L Final  . Alkaline Phosphatase 07/07/2020 92  38 - 126 U/L  Final  . Total Bilirubin 07/07/2020 0.8  0.3 - 1.2 mg/dL Final  . GFR, Estimated 07/07/2020 >60  >60 mL/min Final   Comment: (NOTE) Calculated using the CKD-EPI Creatinine Equation (2021)   . Anion gap 07/07/2020 9  5 - 15 Final   Performed at [facility], [address] [place], [place] [address]  . Prothrombin Time 07/07/2020 13.2  11.4 - 15.2 seconds Final  . INR 07/07/2020 1.0  0.8 - 1.2 Final   Comment: (NOTE) INR goal varies based on device and disease states. Performed at [facility], [address], [place], [place] [address]   . aPTT 07/07/2020 28  24 - 36 seconds Final   Performed at [facility], [address] [place], [place] [address]  . Color, Urine 07/07/2020 YELLOW* YELLOW Final  . APPearance 07/07/2020 CLEAR* CLEAR Final  . Specific Gravity, Urine 07/07/2020 1.014  1.005 - 1.030 Final  . pH 07/07/2020 5.0  5.0 - 8.0 Final  . Glucose, UA 07/07/2020 NEGATIVE  NEGATIVE mg/dL Final  . Hgb urine dipstick 07/07/2020 NEGATIVE  NEGATIVE Final  . Bilirubin Urine 07/07/2020 NEGATIVE  NEGATIVE Final  . Ketones, ur 07/07/2020 NEGATIVE  NEGATIVE mg/dL Final  . Protein, ur 07/07/2020 NEGATIVE  NEGATIVE mg/dL Final  . Nitrite 07/07/2020 NEGATIVE  NEGATIVE Final  . Chalmers Guest 07/07/2020 NEGATIVE  NEGATIVE Final   Performed at [facility], [address] [place], [place] [address]  . Specimen Description 07/07/2020    Final                    Value:URINE, RANDOM Performed at [facility], [address] [place], [place] [address]   . Special Requests 07/07/2020    Final                   Value:Normal Performed at [facility] [address] [place], [place] [address]   . Culture 07/07/2020    Final                   Value:NO GROWTH Performed at [facility], [place] [address] [place], [place] [address]   . Report Status 07/07/2020 07/08/2020 FINAL   Final  . ABO/RH(D) 07/07/2020 A POS   Final  . Antibody Screen 07/07/2020 NEG   Final  . Sample Expiration 07/07/2020 07/21/2020,2359   Final  . Extend sample reason 07/07/2020    Final                   Value:NO TRANSFUSIONS OR PREGNANCY IN THE PAST 3 MONTHS Performed at [facility], [address], [place], [place] [address]   . WBC 07/07/2020 5.7  4.0 - 10.5 K/uL Final  . RBC 07/07/2020 3.95* 4.22 - 5.81 MIL/uL Final  . Hemoglobin 07/07/2020 11.7* 13.0 - 17.0 g/dL Final  . HCT 07/07/2020 35.3* 39.0 - 52.0 % Final  . MCV 07/07/2020 89.4  80.0 - 100.0 fL Final  . MCH 07/07/2020 29.6  26.0 - 34.0 pg Final  . MCHC 07/07/2020 33.1  30.0 - 36.0 g/dL Final  . RDW 07/07/2020 12.8  11.5 - 15.5 % Final  . Platelets 07/07/2020 206  150 - 400 K/uL Final  . nRBC 07/07/2020 0.0  0.0 - 0.2 % Final  . Neutrophils Relative % 07/07/2020 65  % Final  . Neutro Abs 07/07/2020 3.7  1.7 - 7.7 K/uL Final  . Lymphocytes Relative 07/07/2020 20  % Final  .  Lymphs Abs 07/07/2020 1.1  0.7 - 4.0 K/uL Final  . Monocytes Relative 07/07/2020 10  % Final  . Monocytes Absolute 07/07/2020 0.6  0.1 - 1.0 K/uL Final  . Eosinophils Relative 07/07/2020 3  % Final  . Eosinophils Absolute 07/07/2020 0.2  0.0 - 0.5 K/uL Final  . Basophils Relative 07/07/2020 1  % Final  . Basophils Absolute 07/07/2020 0.0  0.0 - 0.1 K/uL Final  . Immature Granulocytes 07/07/2020 1  % Final  . Abs Immature Granulocytes 07/07/2020 0.05  0.00 - 0.07 K/uL Final   Performed at  [facility], [address] [place],  [address]    ECG: Date: 04/26/2020 Rate: 64 bpm Rhythm: Atrial sensed ventricular paced rhythm Intervals: PR 176 ms. QRS 174 ms. QTc 460 ms. ST segment and T wave changes: No evidence of acute ST segment elevation or depression Comparison: Similar to previous tracing obtained on 04/07/2019 NOTE: Tracing obtained at [facility]; unable for review. Above based on cardiologist's interpretation.    IMAGING / PROCEDURES: ECHOCARDIOGRAM performed on 04/23/2019 1. LVEF 45% 2. Mild left ventricular systolic dysfunction 3. Normal right ventricular systolic function trivial MR and TR 4. No AR or PR 5. No valvular stenosis 6. No evidence of a pericardial effusion  BILATERAL CAROTID DUPLEX STUDY performed on 08/01/2018 1. Right carotid: The extracranial vessels were near normal with only minimal wall thickening or plaque 2. Left carotid: The extracranial vessels were near normal with only minimal wall thickening or plaque  LEFT HEART CATHETERIZATION AND CORONARY ANGIOGRAPHY performed on 06/29/2017 1. Mild two-vessel CAD  35% stenosis ostial LM to mid LM  30% stenosis mid RCA 2. Abnormal left ventricular function with ejection fraction of 45% and mild hypokinesis 3. Recommendations: continue medical management of CAD risk factors  LEXISCAN performed on 06/21/2017 1. LVEF 53% 2. Regional wall motion reveals normal myocardial thickening and wall motion 3. No artifacts noted 4. Left-ventricular cavity size normal 5. No evidence of stress-induced myocardial ischemia or arrhythmia 6. Baseline LBBB 7. The overall quality of the study is good 8. Normal myocardial perfusion imaging without evidence of ischemia  Impression and Plan:  Taesean Reth has been referred for pre-anesthesia review and clearance prior to him undergoing the planned anesthetic and procedural courses. Available labs, pertinent testing, and imaging results were  personally reviewed by me. This patient has been appropriately cleared by cardiology with an overall LOW risk of significant perioperative cardiovascular complications.  Perioperative prescription for cardiac device management form completed by primary cardiologist and placed on chart for review by surgical/anesthetic team.  Based on clinical review performed today (07/12/20), barring any significant acute changes in the patient's overall condition, it is anticipated that he will be able to proceed with the planned surgical intervention. Any acute changes in clinical condition may necessitate his procedure being postponed and/or cancelled. Patient will meet with anesthesia team (MD and/or CRNA) on this day of his procedure for preoperative evaluation/assessment.   Pre-surgical instructions were reviewed with the patient during his PAT appointment and questions were fielded by PAT clinical staff. Patient was advised that if any questions or concerns arise prior to his procedure then he should return a call to PAT and/or his surgeon's office to discuss.  Honor Loh, MSN, APRN, FNP-C, CEN [facility]  Peri-operative Services Nurse Practitioner Phone: [phone] 07/12/20 12:14 PM  NOTE: This note has been prepared using Dragon dictation software. Despite my best ability to proofread, there is always the potential that  unintentional transcriptional errors may still occur from this process.

## 2020-07-14 ENCOUNTER — Inpatient Hospital Stay
Admission: RE | Admit: 2020-07-14 | Discharge: 2020-07-15 | DRG: 470 | Disposition: A | Discharge status: Home / Self Care | Source: Ambulatory Visit | Attending: Orthopedic Surgery | Admitting: Orthopedic Surgery

## 2020-07-14 ENCOUNTER — Other Ambulatory Visit: Payer: Self-pay

## 2020-07-14 ENCOUNTER — Inpatient Hospital Stay: Admitting: Urgent Care

## 2020-07-14 ENCOUNTER — Inpatient Hospital Stay

## 2020-07-14 ENCOUNTER — Encounter: Payer: Self-pay | Admitting: Orthopedic Surgery

## 2020-07-14 ENCOUNTER — Encounter
Admission: RE | Disposition: A | Discharge status: Home / Self Care | Payer: Self-pay | Source: Ambulatory Visit | Attending: Orthopedic Surgery

## 2020-07-14 DIAGNOSIS — D631 Anemia in chronic kidney disease: Secondary | ICD-10-CM | POA: Diagnosis present

## 2020-07-14 DIAGNOSIS — Z85820 Personal history of malignant melanoma of skin: Secondary | ICD-10-CM | POA: Diagnosis not present

## 2020-07-14 DIAGNOSIS — Z79899 Other long term (current) drug therapy: Secondary | ICD-10-CM | POA: Diagnosis not present

## 2020-07-14 DIAGNOSIS — M1A9XX0 Chronic gout, unspecified, without tophus (tophi): Secondary | ICD-10-CM | POA: Diagnosis present

## 2020-07-14 DIAGNOSIS — Z950 Presence of cardiac pacemaker: Secondary | ICD-10-CM | POA: Diagnosis not present

## 2020-07-14 DIAGNOSIS — I700 Atherosclerosis of aorta: Secondary | ICD-10-CM | POA: Diagnosis present

## 2020-07-14 DIAGNOSIS — Z923 Personal history of irradiation: Secondary | ICD-10-CM

## 2020-07-14 DIAGNOSIS — I2510 Atherosclerotic heart disease of native coronary artery without angina pectoris: Secondary | ICD-10-CM | POA: Diagnosis present

## 2020-07-14 DIAGNOSIS — Z96642 Presence of left artificial hip joint: Secondary | ICD-10-CM

## 2020-07-14 DIAGNOSIS — H538 Other visual disturbances: Secondary | ICD-10-CM | POA: Insufficient documentation

## 2020-07-14 DIAGNOSIS — Z9221 Personal history of antineoplastic chemotherapy: Secondary | ICD-10-CM | POA: Diagnosis not present

## 2020-07-14 DIAGNOSIS — N1831 Chronic kidney disease, stage 3a: Secondary | ICD-10-CM | POA: Diagnosis present

## 2020-07-14 DIAGNOSIS — G4733 Obstructive sleep apnea (adult) (pediatric): Secondary | ICD-10-CM | POA: Diagnosis present

## 2020-07-14 DIAGNOSIS — M179 Osteoarthritis of knee, unspecified: Secondary | ICD-10-CM | POA: Insufficient documentation

## 2020-07-14 DIAGNOSIS — Z886 Allergy status to analgesic agent status: Secondary | ICD-10-CM | POA: Diagnosis not present

## 2020-07-14 DIAGNOSIS — I129 Hypertensive chronic kidney disease with stage 1 through stage 4 chronic kidney disease, or unspecified chronic kidney disease: Secondary | ICD-10-CM | POA: Diagnosis present

## 2020-07-14 DIAGNOSIS — Z7982 Long term (current) use of aspirin: Secondary | ICD-10-CM

## 2020-07-14 DIAGNOSIS — I442 Atrioventricular block, complete: Secondary | ICD-10-CM | POA: Diagnosis present

## 2020-07-14 DIAGNOSIS — E785 Hyperlipidemia, unspecified: Secondary | ICD-10-CM | POA: Diagnosis present

## 2020-07-14 DIAGNOSIS — M1710 Unilateral primary osteoarthritis, unspecified knee: Secondary | ICD-10-CM | POA: Insufficient documentation

## 2020-07-14 DIAGNOSIS — K219 Gastro-esophageal reflux disease without esophagitis: Secondary | ICD-10-CM | POA: Diagnosis present

## 2020-07-14 DIAGNOSIS — Z20822 Contact with and (suspected) exposure to covid-19: Secondary | ICD-10-CM | POA: Diagnosis present

## 2020-07-14 DIAGNOSIS — Z79891 Long term (current) use of opiate analgesic: Secondary | ICD-10-CM | POA: Diagnosis not present

## 2020-07-14 DIAGNOSIS — Z96649 Presence of unspecified artificial hip joint: Secondary | ICD-10-CM

## 2020-07-14 DIAGNOSIS — M1612 Unilateral primary osteoarthritis, left hip: Secondary | ICD-10-CM | POA: Diagnosis present

## 2020-07-14 HISTORY — DX: Presence of other vascular implants and grafts: Z95.828

## 2020-07-14 HISTORY — DX: Personal history of other diseases of the circulatory system: Z86.79

## 2020-07-14 HISTORY — DX: Left bundle-branch block, unspecified: I44.7

## 2020-07-14 HISTORY — PX: TOTAL HIP ARTHROPLASTY: SHX124

## 2020-07-14 HISTORY — DX: Obstructive sleep apnea (adult) (pediatric): G47.33

## 2020-07-14 HISTORY — DX: Obstructive sleep apnea (adult) (pediatric): Z99.89

## 2020-07-14 HISTORY — DX: Atherosclerosis of aorta: I70.0

## 2020-07-14 HISTORY — DX: Presence of cardiac pacemaker: Z95.0

## 2020-07-14 LAB — ABO/RH: ABO/RH(D): A POS

## 2020-07-14 SURGERY — ARTHROPLASTY, HIP, TOTAL,POSTERIOR APPROACH
Anesthesia: Spinal | Site: Hip | Laterality: Left

## 2020-07-14 MED ORDER — PHENYLEPHRINE HCL (PRESSORS) 10 MG/ML IV SOLN
INTRAVENOUS | Status: DC | PRN
  Administered 2020-07-14: 200 ug via INTRAVENOUS
  Administered 2020-07-14: 100 ug via INTRAVENOUS
  Administered 2020-07-14: 200 ug via INTRAVENOUS

## 2020-07-14 MED ORDER — ONDANSETRON HCL 4 MG PO TABS
4.0000 mg | ORAL_TABLET | Freq: Four times a day (QID) | ORAL | Status: DC | PRN
  Administered 2020-07-14: 4 mg via ORAL
  Filled 2020-07-14: qty 1

## 2020-07-14 MED ORDER — PHENOL 1.4 % MT LIQD
1.0000 | OROMUCOSAL | Status: DC | PRN
  Filled 2020-07-14: qty 177

## 2020-07-14 MED ORDER — LACTATED RINGERS IV SOLN: INTRAVENOUS | Status: DC

## 2020-07-14 MED ORDER — TRANEXAMIC ACID-NACL 1000-0.7 MG/100ML-% IV SOLN
INTRAVENOUS | Status: AC
  Filled 2020-07-14: qty 100

## 2020-07-14 MED ORDER — PROPOFOL 500 MG/50ML IV EMUL
INTRAVENOUS | Status: DC | PRN
  Administered 2020-07-14: 75 ug/kg/min via INTRAVENOUS

## 2020-07-14 MED ORDER — CEFAZOLIN SODIUM-DEXTROSE 2-4 GM/100ML-% IV SOLN
2.0000 g | INTRAVENOUS | Status: AC
  Administered 2020-07-14: 2 g via INTRAVENOUS

## 2020-07-14 MED ORDER — DEXAMETHASONE SODIUM PHOSPHATE 10 MG/ML IJ SOLN: 8.0000 mg | Freq: Once | INTRAMUSCULAR | Status: AC

## 2020-07-14 MED ORDER — KETOCONAZOLE 2 % EX CREA
1.0000 "application " | TOPICAL_CREAM | Freq: Every day | CUTANEOUS | Status: DC
  Administered 2020-07-14 – 2020-07-15 (×2): 1 via TOPICAL
  Filled 2020-07-14: qty 15

## 2020-07-14 MED ORDER — OXYCODONE HCL 5 MG PO TABS
10.0000 mg | ORAL_TABLET | ORAL | Status: DC | PRN
  Administered 2020-07-14 – 2020-07-15 (×3): 10 mg via ORAL
  Filled 2020-07-14 (×3): qty 2

## 2020-07-14 MED ORDER — CLONAZEPAM 0.5 MG PO TABS
0.5000 mg | ORAL_TABLET | Freq: Every day | ORAL | Status: DC
  Administered 2020-07-15: 0.5 mg via ORAL
  Filled 2020-07-14: qty 1

## 2020-07-14 MED ORDER — CEFAZOLIN SODIUM-DEXTROSE 2-4 GM/100ML-% IV SOLN
2.0000 g | Freq: Four times a day (QID) | INTRAVENOUS | Status: AC
  Administered 2020-07-14 (×2): 2 g via INTRAVENOUS
  Filled 2020-07-14 (×2): qty 100

## 2020-07-14 MED ORDER — CHLORHEXIDINE GLUCONATE 0.12 % MT SOLN
OROMUCOSAL | Status: AC
  Administered 2020-07-14: 15 mL via OROMUCOSAL
  Filled 2020-07-14: qty 15

## 2020-07-14 MED ORDER — NEOMYCIN-POLYMYXIN B GU 40-200000 IR SOLN
Status: DC | PRN
  Administered 2020-07-14: 2 mL

## 2020-07-14 MED ORDER — FENTANYL CITRATE (PF) 100 MCG/2ML IJ SOLN
INTRAMUSCULAR | Status: AC
  Filled 2020-07-14: qty 2

## 2020-07-14 MED ORDER — TRANEXAMIC ACID-NACL 1000-0.7 MG/100ML-% IV SOLN
1000.0000 mg | INTRAVENOUS | Status: AC
  Administered 2020-07-14: 1000 mg via INTRAVENOUS

## 2020-07-14 MED ORDER — ATORVASTATIN CALCIUM 20 MG PO TABS
20.0000 mg | ORAL_TABLET | Freq: Every day | ORAL | Status: DC
  Administered 2020-07-15: 20 mg via ORAL
  Filled 2020-07-14: qty 1

## 2020-07-14 MED ORDER — FENTANYL CITRATE (PF) 100 MCG/2ML IJ SOLN
INTRAMUSCULAR | Status: AC
  Administered 2020-07-14: 25 ug via INTRAVENOUS
  Filled 2020-07-14: qty 2

## 2020-07-14 MED ORDER — ORAL CARE MOUTH RINSE: 15.0000 mL | Freq: Once | OROMUCOSAL | Status: AC

## 2020-07-14 MED ORDER — GABAPENTIN 300 MG PO CAPS: 300.0000 mg | ORAL_CAPSULE | Freq: Once | ORAL | Status: AC

## 2020-07-14 MED ORDER — PROPOFOL 10 MG/ML IV BOLUS
INTRAVENOUS | Status: DC | PRN
  Administered 2020-07-14: 30 mg via INTRAVENOUS
  Administered 2020-07-14: 50 mg via INTRAVENOUS

## 2020-07-14 MED ORDER — DEXAMETHASONE SODIUM PHOSPHATE 10 MG/ML IJ SOLN
INTRAMUSCULAR | Status: AC
  Administered 2020-07-14: 8 mg via INTRAVENOUS
  Filled 2020-07-14: qty 1

## 2020-07-14 MED ORDER — TRANEXAMIC ACID-NACL 1000-0.7 MG/100ML-% IV SOLN: 1000.0000 mg | Freq: Once | INTRAVENOUS | Status: AC

## 2020-07-14 MED ORDER — ALLOPURINOL 300 MG PO TABS
300.0000 mg | ORAL_TABLET | Freq: Every day | ORAL | Status: DC
  Administered 2020-07-15: 300 mg via ORAL
  Filled 2020-07-14 (×2): qty 1

## 2020-07-14 MED ORDER — ONDANSETRON HCL 4 MG/2ML IJ SOLN
INTRAMUSCULAR | Status: AC
  Filled 2020-07-14: qty 2

## 2020-07-14 MED ORDER — HYDROMORPHONE HCL 1 MG/ML IJ SOLN: 0.5000 mg | INTRAMUSCULAR | Status: DC | PRN

## 2020-07-14 MED ORDER — ACETAMINOPHEN 10 MG/ML IV SOLN
1000.0000 mg | Freq: Four times a day (QID) | INTRAVENOUS | Status: AC
  Administered 2020-07-14 – 2020-07-15 (×4): 1000 mg via INTRAVENOUS
  Filled 2020-07-14 (×4): qty 100

## 2020-07-14 MED ORDER — FENTANYL CITRATE (PF) 100 MCG/2ML IJ SOLN
25.0000 ug | INTRAMUSCULAR | Status: DC | PRN
Start: 2020-07-14 — End: 2020-07-14
  Administered 2020-07-14 (×2): 25 ug via INTRAVENOUS

## 2020-07-14 MED ORDER — MENTHOL 3 MG MT LOZG
1.0000 | LOZENGE | OROMUCOSAL | Status: DC | PRN
  Filled 2020-07-14: qty 9

## 2020-07-14 MED ORDER — GLYCOPYRROLATE 0.2 MG/ML IJ SOLN
INTRAMUSCULAR | Status: AC
  Filled 2020-07-14: qty 1

## 2020-07-14 MED ORDER — GABAPENTIN 300 MG PO CAPS
ORAL_CAPSULE | ORAL | Status: AC
  Administered 2020-07-14: 300 mg via ORAL
  Filled 2020-07-14: qty 1

## 2020-07-14 MED ORDER — ACETAMINOPHEN 10 MG/ML IV SOLN
INTRAVENOUS | Status: AC
  Filled 2020-07-14: qty 100

## 2020-07-14 MED ORDER — PANTOPRAZOLE SODIUM 40 MG PO TBEC
40.0000 mg | DELAYED_RELEASE_TABLET | Freq: Two times a day (BID) | ORAL | Status: DC
  Administered 2020-07-14 – 2020-07-15 (×2): 40 mg via ORAL
  Filled 2020-07-14 (×2): qty 1

## 2020-07-14 MED ORDER — CLONAZEPAM 0.5 MG PO TABS: 0.5000 mg | ORAL_TABLET | Freq: Two times a day (BID) | ORAL | Status: DC

## 2020-07-14 MED ORDER — ACETAMINOPHEN 325 MG PO TABS: 325.0000 mg | ORAL_TABLET | Freq: Four times a day (QID) | ORAL | Status: DC | PRN

## 2020-07-14 MED ORDER — METOCLOPRAMIDE HCL 10 MG PO TABS
10.0000 mg | ORAL_TABLET | Freq: Three times a day (TID) | ORAL | Status: DC
  Administered 2020-07-14 – 2020-07-15 (×4): 10 mg via ORAL
  Filled 2020-07-14 (×4): qty 1

## 2020-07-14 MED ORDER — PROPOFOL 1000 MG/100ML IV EMUL
INTRAVENOUS | Status: AC
  Filled 2020-07-14: qty 100

## 2020-07-14 MED ORDER — FLEET ENEMA 7-19 GM/118ML RE ENEM: 1.0000 | ENEMA | Freq: Once | RECTAL | Status: DC | PRN

## 2020-07-14 MED ORDER — ADULT MULTIVITAMIN W/MINERALS CH
1.0000 | ORAL_TABLET | Freq: Every day | ORAL | Status: DC
  Administered 2020-07-15: 1 via ORAL
  Filled 2020-07-14: qty 1

## 2020-07-14 MED ORDER — ACETAMINOPHEN 10 MG/ML IV SOLN
INTRAVENOUS | Status: DC | PRN
  Administered 2020-07-14: 1000 mg via INTRAVENOUS

## 2020-07-14 MED ORDER — CLONAZEPAM 1 MG PO TABS
1.0000 mg | ORAL_TABLET | Freq: Every day | ORAL | Status: DC
  Administered 2020-07-14: 1 mg via ORAL
  Filled 2020-07-14: qty 1

## 2020-07-14 MED ORDER — PROPOFOL 10 MG/ML IV BOLUS
INTRAVENOUS | Status: AC
  Filled 2020-07-14: qty 60

## 2020-07-14 MED ORDER — ALUM & MAG HYDROXIDE-SIMETH 200-200-20 MG/5ML PO SUSP: 30.0000 mL | ORAL | Status: DC | PRN

## 2020-07-14 MED ORDER — BISACODYL 10 MG RE SUPP: 10.0000 mg | Freq: Every day | RECTAL | Status: DC | PRN

## 2020-07-14 MED ORDER — HYDROCORTISONE 1 % EX OINT
TOPICAL_OINTMENT | Freq: Two times a day (BID) | CUTANEOUS | Status: DC
  Filled 2020-07-14: qty 28.35

## 2020-07-14 MED ORDER — MIDAZOLAM HCL 5 MG/5ML IJ SOLN
INTRAMUSCULAR | Status: DC | PRN
  Administered 2020-07-14: 2 mg via INTRAVENOUS

## 2020-07-14 MED ORDER — TRANEXAMIC ACID-NACL 1000-0.7 MG/100ML-% IV SOLN
INTRAVENOUS | Status: AC
  Administered 2020-07-14: 1000 mg via INTRAVENOUS
  Filled 2020-07-14: qty 100

## 2020-07-14 MED ORDER — MAGNESIUM HYDROXIDE 400 MG/5ML PO SUSP
30.0000 mL | Freq: Every day | ORAL | Status: DC
  Administered 2020-07-14 – 2020-07-15 (×2): 30 mL via ORAL
  Filled 2020-07-14 (×2): qty 30

## 2020-07-14 MED ORDER — BUPIVACAINE HCL (PF) 0.5 % IJ SOLN
INTRAMUSCULAR | Status: DC | PRN
  Administered 2020-07-14: 3 mL via INTRATHECAL

## 2020-07-14 MED ORDER — EPHEDRINE SULFATE 50 MG/ML IJ SOLN
INTRAMUSCULAR | Status: DC | PRN
  Administered 2020-07-14: 10 mg via INTRAVENOUS

## 2020-07-14 MED ORDER — CHLORHEXIDINE GLUCONATE 0.12 % MT SOLN: 15.0000 mL | Freq: Once | OROMUCOSAL | Status: AC

## 2020-07-14 MED ORDER — TRAMADOL HCL 50 MG PO TABS
50.0000 mg | ORAL_TABLET | ORAL | Status: DC | PRN
Start: 2020-07-14 — End: 2020-07-15
  Administered 2020-07-14: 100 mg via ORAL
  Filled 2020-07-14: qty 2

## 2020-07-14 MED ORDER — ONDANSETRON HCL 4 MG/2ML IJ SOLN
4.0000 mg | Freq: Once | INTRAMUSCULAR | Status: DC | PRN
Start: 2020-07-14 — End: 2020-07-14

## 2020-07-14 MED ORDER — ONDANSETRON HCL 4 MG/2ML IJ SOLN
INTRAMUSCULAR | Status: DC | PRN
  Administered 2020-07-14: 4 mg via INTRAVENOUS

## 2020-07-14 MED ORDER — ONDANSETRON HCL 4 MG/2ML IJ SOLN: 4.0000 mg | Freq: Four times a day (QID) | INTRAMUSCULAR | Status: DC | PRN

## 2020-07-14 MED ORDER — LISINOPRIL 20 MG PO TABS
20.0000 mg | ORAL_TABLET | Freq: Every day | ORAL | Status: DC
  Administered 2020-07-15: 20 mg via ORAL
  Filled 2020-07-14: qty 1

## 2020-07-14 MED ORDER — CEFAZOLIN SODIUM-DEXTROSE 2-4 GM/100ML-% IV SOLN
INTRAVENOUS | Status: AC
  Filled 2020-07-14: qty 100

## 2020-07-14 MED ORDER — ENSURE PRE-SURGERY PO LIQD
296.0000 mL | Freq: Once | ORAL | Status: AC
Start: 2020-07-14 — End: 2020-07-14
  Administered 2020-07-14: 296 mL via ORAL
  Filled 2020-07-14: qty 296

## 2020-07-14 MED ORDER — LIDOCAINE HCL (PF) 2 % IJ SOLN
INTRAMUSCULAR | Status: AC
  Filled 2020-07-14: qty 5

## 2020-07-14 MED ORDER — CHLORHEXIDINE GLUCONATE 4 % EX LIQD: 60.0000 mL | Freq: Once | CUTANEOUS | Status: DC

## 2020-07-14 MED ORDER — SODIUM CHLORIDE 0.9 % IV SOLN
INTRAVENOUS | Status: DC | PRN
  Administered 2020-07-14: 25 ug/min via INTRAVENOUS

## 2020-07-14 MED ORDER — GLYCOPYRROLATE 0.2 MG/ML IJ SOLN
INTRAMUSCULAR | Status: DC | PRN
  Administered 2020-07-14: .2 mg via INTRAVENOUS

## 2020-07-14 MED ORDER — FENTANYL CITRATE (PF) 100 MCG/2ML IJ SOLN
INTRAMUSCULAR | Status: DC | PRN
  Administered 2020-07-14 (×4): 25 ug via INTRAVENOUS

## 2020-07-14 MED ORDER — MIDAZOLAM HCL 2 MG/2ML IJ SOLN
INTRAMUSCULAR | Status: AC
  Filled 2020-07-14: qty 2

## 2020-07-14 MED ORDER — DIPHENHYDRAMINE HCL 12.5 MG/5ML PO ELIX
12.5000 mg | ORAL_SOLUTION | ORAL | Status: DC | PRN
Start: 2020-07-14 — End: 2020-07-15

## 2020-07-14 MED ORDER — FERROUS SULFATE 325 (65 FE) MG PO TABS
325.0000 mg | ORAL_TABLET | Freq: Two times a day (BID) | ORAL | Status: DC
  Administered 2020-07-15: 325 mg via ORAL
  Filled 2020-07-14 (×2): qty 1

## 2020-07-14 MED ORDER — SODIUM CHLORIDE 0.9 % IV SOLN: INTRAVENOUS | Status: DC

## 2020-07-14 MED ORDER — NEOMYCIN-POLYMYXIN B GU 40-200000 IR SOLN
Status: AC
  Filled 2020-07-14: qty 2

## 2020-07-14 MED ORDER — OXYCODONE HCL 5 MG PO TABS: 5.0000 mg | ORAL_TABLET | ORAL | Status: DC | PRN

## 2020-07-14 MED ORDER — PHENYLEPHRINE HCL (PRESSORS) 10 MG/ML IV SOLN
INTRAVENOUS | Status: AC
  Filled 2020-07-14: qty 1

## 2020-07-14 MED ORDER — EPHEDRINE 5 MG/ML INJ
INTRAVENOUS | Status: AC
  Filled 2020-07-14: qty 10

## 2020-07-14 MED ORDER — SENNOSIDES-DOCUSATE SODIUM 8.6-50 MG PO TABS
1.0000 | ORAL_TABLET | Freq: Two times a day (BID) | ORAL | Status: DC
  Administered 2020-07-14 – 2020-07-15 (×2): 1 via ORAL
  Filled 2020-07-14 (×2): qty 1

## 2020-07-14 MED ORDER — ONDANSETRON HCL 4 MG PO TABS: 4.0000 mg | ORAL_TABLET | Freq: Three times a day (TID) | ORAL | Status: DC | PRN

## 2020-07-14 MED ORDER — METOPROLOL SUCCINATE ER 25 MG PO TB24
25.0000 mg | ORAL_TABLET | Freq: Every day | ORAL | Status: DC
  Administered 2020-07-15: 25 mg via ORAL
  Filled 2020-07-14: qty 1

## 2020-07-14 MED ORDER — ENOXAPARIN SODIUM 30 MG/0.3ML ~~LOC~~ SOLN
30.0000 mg | Freq: Two times a day (BID) | SUBCUTANEOUS | Status: DC
  Administered 2020-07-15: 30 mg via SUBCUTANEOUS
  Filled 2020-07-14: qty 0.3

## 2020-07-14 SURGICAL SUPPLY — 60 items
BLADE DRUM FLTD (BLADE) ×2 IMPLANT
BLADE SAW 90X25X1.19 OSCILLAT (BLADE) ×2 IMPLANT
CANISTER SUCT 1200ML W/VALVE (MISCELLANEOUS) ×2 IMPLANT
CARTRIDGE OIL MAESTRO DRILL (MISCELLANEOUS) ×1 IMPLANT
COVER WAND RF STERILE (DRAPES) ×2 IMPLANT
CUP ACETBLR 52 OD 100 SERIES (Hips) ×1 IMPLANT
DIFFUSER DRILL AIR PNEUMATIC (MISCELLANEOUS) ×2 IMPLANT
DRAPE 3/4 80X56 (DRAPES) ×2 IMPLANT
DRAPE INCISE IOBAN 66X60 STRL (DRAPES) ×2 IMPLANT
DRSG DERMACEA 8X12 NADH (GAUZE/BANDAGES/DRESSINGS) ×2 IMPLANT
DRSG MEPILEX SACRM 8.7X9.8 (GAUZE/BANDAGES/DRESSINGS) ×2 IMPLANT
DRSG OPSITE POSTOP 4X12 (GAUZE/BANDAGES/DRESSINGS) ×2 IMPLANT
DRSG OPSITE POSTOP 4X14 (GAUZE/BANDAGES/DRESSINGS) IMPLANT
DRSG TEGADERM 4X4.75 (GAUZE/BANDAGES/DRESSINGS) ×2 IMPLANT
DURAPREP 26ML APPLICATOR (WOUND CARE) ×2 IMPLANT
ELECT CAUTERY BLADE 6.4 (BLADE) ×2 IMPLANT
ELECT REM PT RETURN 9FT ADLT (ELECTROSURGICAL) ×2
ELECTRODE REM PT RTRN 9FT ADLT (ELECTROSURGICAL) ×1 IMPLANT
GLOVE SURG ENC MOIS LTX SZ7.5 (GLOVE) ×4 IMPLANT
GLOVE SURG ENC TEXT LTX SZ7.5 (GLOVE) ×4 IMPLANT
GLOVE SURG UNDER LTX SZ8 (GLOVE) ×2 IMPLANT
GLOVE SURG UNDER POLY LF SZ7.5 (GLOVE) ×2 IMPLANT
GOWN STRL REUS W/ TWL LRG LVL3 (GOWN DISPOSABLE) ×2 IMPLANT
GOWN STRL REUS W/ TWL XL LVL3 (GOWN DISPOSABLE) ×1 IMPLANT
GOWN STRL REUS W/TWL LRG LVL3 (GOWN DISPOSABLE) ×2
GOWN STRL REUS W/TWL XL LVL3 (GOWN DISPOSABLE) ×1
HEAD M SROM 36MM PLUS 1.5 (Hips) IMPLANT
HEMOVAC 400CC 10FR (MISCELLANEOUS) ×2 IMPLANT
HOLDER FOLEY CATH W/STRAP (MISCELLANEOUS) ×2 IMPLANT
HOOD PEEL AWAY FLYTE STAYCOOL (MISCELLANEOUS) ×4 IMPLANT
IRRIGATION SURGIPHOR STRL (IV SOLUTION) ×2 IMPLANT
IV NS IRRIG 3000ML ARTHROMATIC (IV SOLUTION) ×2 IMPLANT
KIT PEG BOARD PINK (KITS) ×2 IMPLANT
KIT TURNOVER KIT A (KITS) ×2 IMPLANT
LINER ACETAB NEUTRAL 36ID 520D (Liner) ×1 IMPLANT
MANIFOLD NEPTUNE II (INSTRUMENTS) ×4 IMPLANT
NDL SAFETY ECLIPSE 18X1.5 (NEEDLE) ×1 IMPLANT
NEEDLE HYPO 18GX1.5 SHARP (NEEDLE) ×1
NS IRRIG 500ML POUR BTL (IV SOLUTION) ×2 IMPLANT
OIL CARTRIDGE MAESTRO DRILL (MISCELLANEOUS) ×2
PACK HIP PROSTHESIS (MISCELLANEOUS) ×2 IMPLANT
PENCIL SMOKE EVACUATOR COATED (MISCELLANEOUS) ×2 IMPLANT
PULSAVAC PLUS IRRIG FAN TIP (DISPOSABLE) ×2
SOL PREP PVP 2OZ (MISCELLANEOUS) ×2
SOLUTION PREP PVP 2OZ (MISCELLANEOUS) ×1 IMPLANT
SPONGE DRAIN TRACH 4X4 STRL 2S (GAUZE/BANDAGES/DRESSINGS) ×2 IMPLANT
SROM M HEAD 36MM PLUS 1.5 (Hips) ×2 IMPLANT
STAPLER SKIN PROX 35W (STAPLE) ×2 IMPLANT
STEM AML 10.5 STD 6 LRG (Stem) ×1 IMPLANT
SUT ETHIBOND #5 BRAIDED 30INL (SUTURE) ×2 IMPLANT
SUT VIC AB 0 CT1 36 (SUTURE) ×2 IMPLANT
SUT VIC AB 1 CT1 36 (SUTURE) ×4 IMPLANT
SUT VIC AB 2-0 CT1 27 (SUTURE) ×1
SUT VIC AB 2-0 CT1 TAPERPNT 27 (SUTURE) ×1 IMPLANT
SYR 20ML LL LF (SYRINGE) ×2 IMPLANT
TAPE CLOTH 3X10 WHT NS LF (GAUZE/BANDAGES/DRESSINGS) ×2 IMPLANT
TAPE TRANSPORE STRL 2 31045 (GAUZE/BANDAGES/DRESSINGS) ×2 IMPLANT
TIP FAN IRRIG PULSAVAC PLUS (DISPOSABLE) ×1 IMPLANT
TOWEL OR 17X26 4PK STRL BLUE (TOWEL DISPOSABLE) ×2 IMPLANT
TRAY FOLEY MTR SLVR 16FR STAT (SET/KITS/TRAYS/PACK) ×2 IMPLANT

## 2020-07-14 NOTE — Transfer of Care (Signed)
Immediate Anesthesia Transfer of Care Note  Patient: Edward Pitts  Procedure(s) Performed: TOTAL HIP ARTHROPLASTY (Left Hip)  Patient Location: PACU  Anesthesia Type:Spinal  Level of Consciousness: awake, drowsy and patient cooperative  Airway & Oxygen Therapy: Patient Spontanous Breathing  Post-op Assessment: Report given to RN and Post -op Vital signs reviewed and stable  Post vital signs: Reviewed and stable  Last Vitals:  Vitals Value Taken Time  BP 116/52 07/14/20 1245  Temp 36.5 C 07/14/20 1239  Pulse 75 07/14/20 1247  Resp 20 07/14/20 1247  SpO2 92 % 07/14/20 1247  Vitals shown include unvalidated device data.  Last Pain:  Vitals:   07/14/20 0737  TempSrc: Oral  PainSc: 2          Complications: No complications documented.

## 2020-07-14 NOTE — TOC Benefit Eligibility Note (Signed)
Transition of Care [facility]) Benefit Eligibility Note    Patient Details  Name: Edward Pitts MRN: [id number] Date of Birth: [birth year]   Medication/Dose: Lovenox 40 mg daily x 14 days  Covered?: Yes     Prescription Coverage Preferred Pharmacy: [facility] or any in-network pharmacy  Spoke with Person/Company/Phone Number:: [place], [phone]  Co-Pay: $12.00  Prior Approval: No          Edward Pitts Phone Number: 07/14/2020, 2:19 PM

## 2020-07-14 NOTE — H&P (Signed)
The patient has been re-examined, and the chart reviewed, and there have been no interval changes to the documented history and physical.    The risks, benefits, and alternatives have been discussed at length. The patient expressed understanding of the risks benefits and agreed with plans for surgical intervention.  Freddi Schrager P. Doris Gruhn, Jr. M.D.    

## 2020-07-14 NOTE — TOC Benefit Eligibility Note (Signed)
Transition of Care [facility]) Benefit Eligibility Note    Patient Details  Name: Edward Pitts MRN: [id number] Date of Birth: [birth year]   Medication/Dose: Lovenox 40 mg daily x 14 days  Covered?: Yes     Prescription Coverage Preferred [facility]acy: [facility] or any in-network pharmacy  Spoke with Person/Company/Phone Number:: [place], [phone]  Co-Pay: $12.00  Prior Approval: No          Darius Bump Hoke Phone Number: 07/14/2020, 2:28 PM

## 2020-07-14 NOTE — Anesthesia Postprocedure Evaluation (Signed)
Anesthesia Post Note  Patient: Edward Pitts  Procedure(s) Performed: TOTAL HIP ARTHROPLASTY (Left Hip)  Patient location during evaluation: PACU Anesthesia Type: Spinal Level of consciousness: awake and alert Pain management: pain level controlled Vital Signs Assessment: post-procedure vital signs reviewed and stable Respiratory status: spontaneous breathing, nonlabored ventilation, respiratory function stable and patient connected to nasal cannula oxygen Cardiovascular status: blood pressure returned to baseline and stable Postop Assessment: no apparent nausea or vomiting Anesthetic complications: no   No complications documented.   Last Vitals:  Vitals:   07/14/20 1315 07/14/20 1336  BP: 114/60 124/62  Pulse: 75 69  Resp: 20 16  Temp: 36.6 C (!) 36.4 C  SpO2: 97% 94%    Last Pain:  Vitals:   07/14/20 1503  TempSrc:   PainSc: [place] Eleen Litz

## 2020-07-14 NOTE — Anesthesia Procedure Notes (Signed)
Spinal  Patient location during procedure: OR Start time: 07/14/2020 8:32 AM End time: 07/14/2020 8:38 AM Reason for block: surgical anesthesia Staffing Performed: resident/CRNA  Anesthesiologist: Molli Barrows, MD Resident/CRNA: Lowry Bowl, CRNA Preanesthetic Checklist Completed: patient identified, IV checked, site marked, risks and benefits discussed, surgical consent, monitors and equipment checked, pre-op evaluation and timeout performed Spinal Block Patient position: sitting Prep: DuraPrep Patient monitoring: heart rate, cardiac monitor, continuous pulse ox and blood pressure Approach: midline Location: L3-4 Injection technique: single-shot Needle Needle type: [place]  Needle gauge: 22 G Needle length: 9 cm Assessment Sensory level: T4 Events: CSF return

## 2020-07-14 NOTE — Evaluation (Signed)
Physical Therapy Evaluation Patient Details Name: Edward Pitts MRN: [id number] DOB: September 22, 1960 Today's Date: 07/14/2020   History of Present Illness  60 y/o male s/p L total hip replacement 4/20 (posterior approach)  Clinical Impression  Pt eager to work with PT and ultimately did very weill on POD0 effort.  He was able to do bulk of mobility w/o assist, showed good strength and great effort with exercises and was able to ambulate ~65 ft with gradually increasing confidence and cadence.     Follow Up Recommendations Follow surgeon's recommendation for DC plan and follow-up therapies;Home health PT    Equipment Recommendations  3in1 (PT)    Recommendations for Other Services       Precautions / Restrictions Precautions Precautions: Posterior Hip;Fall Precaution Booklet Issued: Yes (comment) Restrictions Weight Bearing Restrictions: Yes LLE Weight Bearing: Weight bearing as tolerated      Mobility  Bed Mobility Overal bed mobility: Needs Assistance Bed Mobility: Supine to Sit     Supine to sit: Min assist;Mod assist     General bed mobility comments: pt was able to maintain precautions while scooting toward EOB, did need considerable assist with getting trunk to upright at EOB however    Transfers Overall transfer level: Needs assistance Equipment used: Rolling walker (2 wheeled) Transfers: Sit to/from Stand Sit to Stand: Min guard         General transfer comment: cues for set up, sequencing and precautions but no direct assist needed  Ambulation/Gait Ambulation/Gait assistance: Min guard Gait Distance (Feet): 65 Feet Assistive device: Rolling walker (2 wheeled)       General Gait Details: Pt with typically hesitancy on initial 20ish feet but was able to increase cadence and confidence and ultimately did well and maintained consistent momentum by the end of the effort  Stairs            Wheelchair Mobility    Modified Rankin (Stroke Patients  Only)       Balance Overall balance assessment: Modified Independent                                           Pertinent Vitals/Pain Pain Assessment: 0-10 Pain Score: 7  Pain Location: L hip    Home Living Family/patient expects to be discharged to:: Private residence Living Arrangements: Spouse/significant other Available Help at Discharge: Family;Available PRN/intermittently   Home Access: Stairs to enter Entrance Stairs-Rails: Right Entrance Stairs-Number of Steps: 1   Home Equipment: Walker - 2 wheels      Prior Function Level of Independence: Independent               Hand Dominance        Extremity/Trunk Assessment   Upper Extremity Assessment Upper Extremity Assessment: Overall WFL for tasks assessed    Lower Extremity Assessment Lower Extremity Assessment:  (expected post op weakness)       Communication   Communication: No difficulties  Cognition Arousal/Alertness: Awake/alert Behavior During Therapy: WFL for tasks assessed/performed Overall Cognitive Status: Within Functional Limits for tasks assessed                                        General Comments      Exercises Total Joint Exercises Ankle Circles/Pumps: AROM;10 reps Quad Sets: AROM Gluteal Sets: AROM  Heel Slides: AAROM;Strengthening;5 reps Hip ABduction/ADduction: AAROM;Strengthening;10 reps   Assessment/Plan    PT Assessment Patient needs continued PT services  PT Problem List Decreased strength;Decreased range of motion;Decreased activity tolerance;Decreased balance;Decreased mobility;Decreased coordination;Decreased knowledge of use of DME;Decreased safety awareness;Decreased knowledge of precautions;Pain       PT Treatment Interventions DME instruction;Gait training;Stair training;Functional mobility training;Therapeutic activities;Therapeutic exercise;Balance training;Neuromuscular re-education;Patient/family education    PT Goals  (Current goals can be found in the Care Plan section)  Acute Rehab PT Goals Patient Stated Goal: get back home PT Goal Formulation: With patient Time For Goal Achievement: 07/27/20 Potential to Achieve Goals: Good    Frequency BID   Barriers to discharge        Co-evaluation               AM-PAC PT "6 Clicks" Mobility  Outcome Measure Help needed turning from your back to your side while in a flat bed without using bedrails?: A Little Help needed moving from lying on your back to sitting on the side of a flat bed without using bedrails?: A Little Help needed moving to and from a bed to a chair (including a wheelchair)?: A Little Help needed standing up from a chair using your arms (e.g., wheelchair or bedside chair)?: A Little Help needed to walk in hospital room?: A Little Help needed climbing 3-5 steps with a railing? : A Little 6 Click Score: 18    End of Session Equipment Utilized During Treatment: Gait belt Activity Tolerance: Patient tolerated treatment well;Patient limited by fatigue Patient left: with chair alarm set;with call bell/phone within reach;with family/visitor present Nurse Communication: Mobility status PT Visit Diagnosis: Muscle weakness (generalized) (M62.81);Difficulty in walking, not elsewhere classified (R26.2);Pain Pain - Right/Left: Left Pain - part of body: Hip    Time: 7867-5449 PT Time Calculation (min) (ACUTE ONLY): 57 min   Charges:   PT Evaluation $PT Eval Low Complexity: 1 Low PT Treatments $Gait Training: 8-22 mins $Therapeutic Exercise: 8-22 mins        Kreg Shropshire, DPT 07/14/2020, 6:41 PM

## 2020-07-14 NOTE — Progress Notes (Signed)
Met with the patient at the bedside He lives at home  with his spouse He will need a 3 in 1, Notified Rhonda With [employer] He has a rolling walker at home, Lovenox will be 12$ copay He will have [facility] PT He has transportation and can afford his medications

## 2020-07-14 NOTE — Op Note (Signed)
OPERATIVE NOTE  DATE OF SURGERY:  07/14/2020  PATIENT NAME:  Edward Pitts   DOB: Apr 09, 1960  MRN: [id number]  PRE-OPERATIVE DIAGNOSIS: Degenerative arthrosis of the left hip, primary  POST-OPERATIVE DIAGNOSIS:  Same  PROCEDURE:  Left total hip arthroplasty  SURGEON:  Marciano Sequin. M.D.  ASSISTANT: Cassell Smiles, PA-C (present and scrubbed throughout the case, critical for assistance with exposure, retraction, instrumentation, and closure)  ANESTHESIA: spinal  ESTIMATED BLOOD LOSS: 100 mL  FLUIDS REPLACED: 1400 mL of crystalloid  DRAINS: 2 medium Hemovac drains  IMPLANTS UTILIZED: DePuy 10.5 mm large stature AML femoral stem, 52 mm OD Pinnacle 100 acetabular component, neutral Pinnacle Altrx polyethylene insert, and a 36 mm M-SPEC +1.5 mm hip ball  INDICATIONS FOR SURGERY: Edward Pitts is a 59 y.o. year old male with a long history of progressive hip and groin  pain. X-rays demonstrated severe degenerative changes. The patient had not seen any significant improvement despite conservative nonsurgical intervention. After discussion of the risks and benefits of surgical intervention, the patient expressed understanding of the risks benefits and agree with plans for total hip arthroplasty.   The risks, benefits, and alternatives were discussed at length including but not limited to the risks of infection, bleeding, nerve injury, stiffness, blood clots, the need for revision surgery, limb length inequality, dislocation, cardiopulmonary complications, among others, and they were willing to proceed.  PROCEDURE IN DETAIL: The patient was brought into the operating room and, after adequate spinal anesthesia was achieved, the patient was placed in a right lateral decubitus position. Axillary roll was placed and all bony prominences were well-padded. The patient's left hip was cleaned and prepped with alcohol and DuraPrep and draped in the usual sterile fashion. A "timeout" was performed as per  usual protocol. A lateral curvilinear incision was made gently curving towards the posterior superior iliac spine. The IT band was incised in line with the skin incision and the fibers of the gluteus maximus were split in line. The piriformis tendon was identified, skeletonized, and incised at its insertion to the proximal femur and reflected posteriorly. A T type posterior capsulotomy was performed. Prior to dislocation of the femoral head, a threaded Steinmann pin was inserted through a separate stab incision into the pelvis superior to the acetabulum and bent in the form of a stylus so as to assess limb length and hip offset throughout the procedure. The femoral head was then dislocated posteriorly. Inspection of the femoral head demonstrated severe degenerative changes with full-thickness loss of articular cartilage. The femoral neck cut was performed using an oscillating saw. The anterior capsule was elevated off of the femoral neck using a periosteal elevator. Attention was then directed to the acetabulum. The remnant of the labrum was excised using electrocautery. Inspection of the acetabulum also demonstrated significant degenerative changes. The acetabulum was reamed in sequential fashion up to a 51 mm diameter. Good punctate bleeding bone was encountered. A 52 mm Pinnacle 100 acetabular component was positioned and impacted into place. Good scratch fit was appreciated. A neutral polyethylene trial was inserted.  Attention was then directed to the proximal femur. A hole for reaming of the proximal femoral canal was created using a high-speed burr. The femoral canal was reamed in sequential fashion up to a 10 mm diameter. This allowed for approximately 7 cm of scratch fit.  It was thus elected to ream up to a 10.5 mm diameter to allow for a line to line fit. Serial broaches were inserted up to a 10.5  mm large stature femoral broach. Calcar region was planed and a trial reduction was performed using a 36 mm  hip ball with a +1.5 mm neck length. Good equalization of limb lengths and hip offset was appreciated and excellent stability was noted both anteriorly and posteriorly. Trial components were removed. The acetabular shell was irrigated with copious amounts of normal saline with antibiotic solution and suctioned dry. A neutral Pinnacle Altrx polyethylene insert was positioned and impacted into place. Next, a 10.5 mm large stature AML femoral stem was positioned and impacted into place. Excellent scratch fit was appreciated. A trial reduction was again performed with a 36 mm hip ball with a +1.5 mm neck length. Again, good equalization of limb lengths was appreciated and excellent stability appreciated both anteriorly and posteriorly. The hip was then dislocated and the trial hip ball was removed. The Morse taper was cleaned and dried. A 36 mm M-SPEC hip ball with a +1.5 mm neck length was placed on the trunnion and impacted into place. The hip was then reduced and placed through range of motion. Excellent stability was appreciated both anteriorly and posteriorly.  The wound was irrigated with copious amounts of normal saline followed by 500 ml of Surgiphor and suctioned dry. Good hemostasis was appreciated. The posterior capsulotomy was repaired using #5 Ethibond. Piriformis tendon was reapproximated to the undersurface of the gluteus medius tendon using #5 Ethibond. The IT band was reapproximated using interrupted sutures of #1 Vicryl. Subcutaneous tissue was approximated using first #0 Vicryl followed by #2-0 Vicryl. The skin was closed with skin staples.  The patient tolerated the procedure well and was transported to the recovery room in stable condition.   Marciano Sequin., M.D.

## 2020-07-14 NOTE — Plan of Care (Signed)
  Problem: Education: Goal: Knowledge of the prescribed therapeutic regimen will improve Outcome: Progressing Goal: Understanding of discharge needs will improve Outcome: Progressing Goal: Individualized Educational Video(s) Outcome: Progressing   Problem: Activity: Goal: Ability to avoid complications of mobility impairment will improve Outcome: Progressing Goal: Ability to tolerate increased activity will improve Outcome: Progressing   Problem: Clinical Measurements: Goal: Postoperative complications will be avoided or minimized Outcome: Progressing   Problem: Education: Goal: Knowledge of General Education information will improve Description: Including pain rating scale, medication(s)/side effects and non-pharmacologic comfort measures Outcome: Progressing   Problem: Clinical Measurements: Goal: Ability to maintain clinical measurements within normal limits will improve Outcome: Progressing Goal: Will remain free from infection Outcome: Progressing   Problem: Coping: Goal: Level of anxiety will decrease Outcome: Progressing   Problem: Elimination: Goal: Will not experience complications related to bowel motility Outcome: Progressing Goal: Will not experience complications related to urinary retention Outcome: Progressing

## 2020-07-14 NOTE — Anesthesia Preprocedure Evaluation (Signed)
Anesthesia Evaluation  Patient identified by MRN, date of birth, ID band Patient awake    Reviewed: Allergy & Precautions, H&P , NPO status , Patient's Chart, lab work & pertinent test results, reviewed documented beta blocker date and time   History of Anesthesia Complications (+) PONV, Family history of anesthesia reaction and history of anesthetic complications  Airway Mallampati: III   Neck ROM: full    Dental  (+) Poor Dentition, Teeth Intact   Pulmonary sleep apnea and Continuous Positive Airway Pressure Ventilation ,    Pulmonary exam normal        Cardiovascular Exercise Tolerance: Poor hypertension, On Medications + angina with exertion + CAD  Normal cardiovascular exam+ dysrhythmias + pacemaker  Rhythm:regular Rate:Normal     Neuro/Psych Seizures -, Well Controlled,  Anxiety negative psych ROS   GI/Hepatic Neg liver ROS, GERD  Medicated,  Endo/Other  negative endocrine ROS  Renal/GU Renal disease  negative genitourinary   Musculoskeletal   Abdominal   Peds  Hematology  (+) Blood dyscrasia, anemia ,   Anesthesia Other Findings Past Medical History: No date: Anemia     Comment:  iron treatments No date: Anxiety No date: Aortic atherosclerosis (HCC) No date: Arthritis 2021: 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 No date: Family history of adverse reaction to anesthesia     Comment:  PONV mother No date: GERD (gastroesophageal reflux disease) No date: History of complete heart block     Comment:  PPM placed No date: Hyperlipidemia No date: Hypertension No date: LBBB (left bundle branch block) No date: Lymphedema of left leg     Comment:  uses thigh high compression  stockings 2012: Melanoma ([place])     Comment:  skin cancer, left thigh No date: OSA on CPAP 04/16/2019: PONV (postoperative nausea and vomiting) No date: Port-A-Cath in place     Comment:  RIGHT chest wall No date: Presence of cardiac pacemaker     Comment:  [employer] No date: Seizures ([place])     Comment:  still has episodes of dizziness. last event 1 month ago               (march 2022) and will pass out. takes clonazepam Past Surgical History: No date: CT RADIATION THERAPY GUIDE     Comment:  left groin No date: dental implant     Comment:  permanent implant No date: KNEE SURGERY; Left     Comment:  arthroscopy 06/29/2017: LEFT HEART CATH AND CORONARY ANGIOGRAPHY; Left     Comment:  Procedure: LEFT HEART CATH AND CORONARY ANGIOGRAPHY;                Surgeon: [name], MD;  Location: [place]               CV LAB;  Service: Cardiovascular;  Laterality: Left; 04/16/2019: LYMPH NODE DISSECTION; Left     Comment:  Procedure: Left inguinal Lymph Node Dissection;                Surgeon: [name], MD;  Location: [place];  Service:               General;  Laterality: Left; 04/16/2019: MELANOMA EXCISION; Left  Comment:  Procedure: MELANOMA EXCISION LEFT GROIN MASS;  Surgeon:               [name], MD;  Location: [place];  Service: General;                Laterality: Left; 2012: MELANOMA EXCISION WITH SENTINEL LYMPH NODE BIOPSY; Left     Comment:  Left calf  08/26/2018: PACEMAKER INSERTION; N/A     Comment:  Procedure: INSERTION PACEMAKER;  Surgeon: [name], MD;  Location: [facility];  Service:               Cardiovascular;  Laterality: N/A; 08/26/2019: PORTA CATH INSERTION; N/A     Comment:  Procedure: PORTA CATH INSERTION;  Surgeon: Katha Cabal, MD;  Location: [place] [facility];  Service:              Cardiovascular;  Laterality: N/A; 2020: SUPERFICIAL LYMPH NODE BIOPSY / EXCISION; Left     Comment:  lymph nodes removed  around left groin melanoma site 08/25/2018: TEMPORARY PACEMAKER; N/A     Comment:  Procedure: TEMPORARY PACEMAKER;  Surgeon: [name], MD;  Location: [place] [facility];  Service:               Cardiovascular;  Laterality: N/A; BMI    Body Mass Index: 36.50 kg/m     Reproductive/Obstetrics negative OB ROS                             Anesthesia Physical Anesthesia Plan  ASA: III  Anesthesia Plan: Spinal   Post-op Pain Management:    Induction:   PONV Risk Score and Plan: 3  Airway Management Planned:   Additional Equipment:   Intra-op Plan:   Post-operative Plan:   Informed Consent: I have reviewed the patients History and Physical, chart, labs and discussed the procedure including the risks, benefits and alternatives for the proposed anesthesia with the patient or authorized representative who has indicated his/her understanding and acceptance.     Dental Advisory Given  Plan Discussed with: CRNA  Anesthesia Plan Comments:         Anesthesia Quick Evaluation

## 2020-07-15 ENCOUNTER — Encounter: Payer: Self-pay | Admitting: Orthopedic Surgery

## 2020-07-15 LAB — SURGICAL PATHOLOGY

## 2020-07-15 MED ORDER — ENOXAPARIN SODIUM 40 MG/0.4ML ~~LOC~~ SOLN: 40.0000 mg | SUBCUTANEOUS | 0 refills | Status: DC

## 2020-07-15 MED ORDER — OXYCODONE HCL 5 MG PO TABS: 5.0000 mg | ORAL_TABLET | ORAL | 0 refills | Status: DC | PRN

## 2020-07-15 NOTE — Progress Notes (Signed)
Physical Therapy Treatment Patient Details Name: Edward Pitts MRN: [id number] DOB: 10/10/1960 Today's Date: 07/15/2020    History of Present Illness 60 y/o male s/p L total hip replacement 4/20 (posterior approach)    PT Comments    Pt was sitting in recliner upon arriving. Agrees to session and reports," I'm ready to do what I need to. I want to go home."  He supportive family member was present and helpful throughout. He was easily able to stand and ambulate > 200 ft. Safely performed ascending/descending 4 stair and ascending/descending one step to simulate home entry. Once pt returned to room reviewed and perform HEP with cueing. Pt will benefit from continued skilled PT to progress pt to PLOF.    Follow Up Recommendations  Home health PT;Follow surgeon's recommendation for DC plan and follow-up therapies     Equipment Recommendations  3in1 (PT)       Precautions / Restrictions Precautions Precautions: Posterior Hip;Fall Precaution Booklet Issued: Yes (comment) Restrictions Weight Bearing Restrictions: Yes LLE Weight Bearing: Weight bearing as tolerated    Mobility  Bed Mobility Overal bed mobility: Needs Assistance Bed Mobility: Supine to Sit     Supine to sit: Min assist     General bed mobility comments: in recliner pre/post session    Transfers Overall transfer level: Needs assistance Equipment used: Rolling walker (2 wheeled) Transfers: Sit to/from Stand Sit to Stand: Supervision         General transfer comment: Was able to stand to RW with supervision  Ambulation/Gait Ambulation/Gait assistance: Supervision Gait Distance (Feet): 200 Feet Assistive device: Rolling walker (2 wheeled) Gait Pattern/deviations: Step-to pattern;Step-through pattern Gait velocity: WNL   General Gait Details: pt started ambulation with step to pattern however did advance to step through by end of session   Stairs Stairs: Yes Stairs assistance: Min guard Stair  Management: No rails;Forwards;With walker;One rail Right;Step to pattern Number of Stairs: 4 General stair comments: Pt has one step to enter home. Was able to perform 4 stair with +1 rail and one step with use of RW only.      Balance Overall balance assessment: Needs assistance Sitting-balance support: No upper extremity supported;Feet supported Sitting balance-Leahy Scale: Normal     Standing balance support: Bilateral upper extremity supported Standing balance-Leahy Scale: Good Standing balance comment: no LOB with BUE support on RW       Cognition Arousal/Alertness: Awake/alert Behavior During Therapy: WFL for tasks assessed/performed Overall Cognitive Status: Within Functional Limits for tasks assessed      General Comments: 3/3 hip precautions      Exercises Total Joint Exercises Ankle Circles/Pumps: AROM;10 reps Quad Sets: AROM;10 reps Gluteal Sets: AROM;10 reps Hip ABduction/ADduction: AROM;10 reps Straight Leg Raises: AAROM;5 reps Other Exercises Other Exercises: Pt and family educated re: OT role, DME recs, d/c recs, falls prevention, functinoal application of post hip pcns Other Exercises: Sup>sit, sit<>stand, sitting/standing balance/tolerance, ~50ft mobility        Pertinent Vitals/Pain Pain Assessment: 0-10 Pain Score: 3  Pain Location: L hip Pain Descriptors / Indicators: Aching;Grimacing Pain Intervention(s): Limited activity within patient's tolerance;Monitored during session;Premedicated before session;Repositioned;Ice applied    Home Living Family/patient expects to be discharged to:: Private residence Living Arrangements: Spouse/significant other Available Help at Discharge: Family;Available PRN/intermittently Type of Home: House Home Access: Stairs to enter Entrance Stairs-Rails: Right Home Layout: One level Home Equipment: Walker - 2 wheels      Prior Function Level of Independence: Independent  PT Goals (current goals can  now be found in the care plan section) Acute Rehab PT Goals Patient Stated Goal: get back home Progress towards PT goals: Progressing toward goals    Frequency    BID      PT Plan Current plan remains appropriate       AM-PAC PT "6 Clicks" Mobility   Outcome Measure  Help needed turning from your back to your side while in a flat bed without using bedrails?: A Little Help needed moving from lying on your back to sitting on the side of a flat bed without using bedrails?: A Little Help needed moving to and from a bed to a chair (including a wheelchair)?: A Little Help needed standing up from a chair using your arms (e.g., wheelchair or bedside chair)?: A Little Help needed to walk in hospital room?: A Little Help needed climbing 3-5 steps with a railing? : A Little 6 Click Score: 18    End of Session Equipment Utilized During Treatment: Gait belt Activity Tolerance: Patient tolerated treatment well Patient left: in chair;with call bell/phone within reach;with chair alarm set;with family/visitor present Nurse Communication: Mobility status PT Visit Diagnosis: Muscle weakness (generalized) (M62.81);Difficulty in walking, not elsewhere classified (R26.2);Pain Pain - Right/Left: Left Pain - part of body: Hip     Time: 3903-0092 PT Time Calculation (min) (ACUTE ONLY): 34 min  Charges:  $Gait Training: 8-22 mins $Therapeutic Exercise: 8-22 mins                     Julaine Fusi PTA 07/15/20, 12:10 PM

## 2020-07-15 NOTE — Progress Notes (Signed)
  Subjective: 1 Day Post-Op Procedure(s) (LRB): TOTAL HIP ARTHROPLASTY (Left) Patient reports pain as well-controlled.   Patient is well, and has had no acute complaints or problems Plan is to go Home after hospital stay. Negative for chest pain and shortness of breath Fever: no Gastrointestinal: negative for nausea and vomiting.   Patient has not had a bowel movement.  Objective: Vital signs in last 24 hours: Temp:  [97.4 F (36.3 C)-97.9 F (36.6 C)] 97.4 F (36.3 C) (04/20 1952) Pulse Rate:  [65-75] 72 (04/20 1952) Resp:  [16-26] 17 (04/20 1952) BP: (106-142)/(52-96) 127/64 (04/20 1952) SpO2:  [92 %-97 %] 97 % (04/20 1952)  Intake/Output from previous day:  Intake/Output Summary (Last 24 hours) at 07/15/2020 0814 Last data filed at 07/15/2020 1610 Gross per 24 hour  Intake 1920 ml  Output 2310 ml  Net -390 ml    Intake/Output this shift: No intake/output data recorded.  Labs: No results for input(s): HGB in the last 72 hours. No results for input(s): WBC, RBC, HCT, PLT in the last 72 hours. No results for input(s): NA, K, CL, CO2, BUN, CREATININE, GLUCOSE, CALCIUM in the last 72 hours. No results for input(s): LABPT, INR in the last 72 hours.   EXAM General - Patient is Alert, Appropriate and Oriented Extremity - Neurovascular intact Dorsiflexion/Plantar flexion intact Compartment soft Dressing/Incision -clean, dry, no drainage, Hemovac in place.  Motor Function - intact, moving foot and toes well on exam.  Cardiovascular- Regular rate and rhythm, no murmurs/rubs/gallops Respiratory- Lungs clear to auscultation bilaterally Gastrointestinal- soft, nontender and active bowel sounds   Assessment/Plan: 1 Day Post-Op Procedure(s) (LRB): TOTAL HIP ARTHROPLASTY (Left) Active Problems:   Hx of total hip arthroplasty, left  Estimated body mass index is 36.5 kg/m as calculated from the following:   Height as of this encounter: 5\' 7"  (1.702 m).   Weight as of this  encounter: 105.7 kg. Advance diet Up with therapy  Discussed goals for PT for discharge home. Will check in this PM.  DVT Prophylaxis - Lovenox, Ted hose and foot pumps Weight-Bearing as tolerated to left leg  Cassell Smiles, PA-C [facility] Orthopaedic Surgery 07/15/2020, 8:14 AM

## 2020-07-15 NOTE — Evaluation (Signed)
Occupational Therapy Evaluation Patient Details Name: Edward Pitts MRN: [id number] DOB: Oct 24, 1960 Today's Date: 07/15/2020    History of Present Illness 60 y/o male s/p L total hip replacement 4/20 (posterior approach)   Clinical Impression   Edward Pitts was seen for OT evaluation this date. Prior to hospital admission, pt was MOD I for mobility and requiring assist for dressing. Pt lives with wife, who works third shift, in 1 level home with 1 STE. Pt presents to acute OT demonstrating impaired ADL performance and functional mobility 2/2 decreased activity tolerance and functional strength/ROM/balance deficits. At start of session pt recalled 2/3 posterior hip pcns. Pt assisted with ordering breakfast.  Pt currently requires MIN A exit L side of bed, assist for trunk control and LLE assistance. CGA + RW for sit<>stand and bed>chair taking ~5 steps from bottom of bed to chair. Pt reports 9/10 pain with mobility, RN in room end of session to deliver pain medicine. Pt and family instructed in functional application of posterior hip precautions, falls prevention strategies, home/routines modifications, DME/AE for LB bathing and dressing tasks, and compression stocking mgt strategies. At end of session, pt able to recall 3/3 posterior hip precautions. Pt would benefit from skilled OT to address noted impairments and functional limitations (see below for any additional details) in order to maximize safety and independence while minimizing falls risk and caregiver burden. Upon hospital discharge, recommend HHOT to maximize pt safety and return to functional independence during meaningful occupations of daily life.      Follow Up Recommendations  Home health OT;Follow surgeon's recommendation for DC plan and follow-up therapies    Equipment Recommendations  3 in 1 bedside commode    Recommendations for Other Services       Precautions / Restrictions Precautions Precautions: Posterior  Hip;Fall Restrictions Weight Bearing Restrictions: Yes LLE Weight Bearing: Weight bearing as tolerated      Mobility Bed Mobility Overal bed mobility: Needs Assistance Bed Mobility: Supine to Sit     Supine to sit: Min assist     General bed mobility comments: pt was able to maintain precautions while scooting toward EOB, did need assist with getting trunk to upright at EOB    Transfers Overall transfer level: Needs assistance Equipment used: Rolling walker (2 wheeled) Transfers: Sit to/from Stand Sit to Stand: Min guard         General transfer comment: cues for set up, sequencing and precautions but no direct assist needed    Balance Overall balance assessment: Needs assistance Sitting-balance support: No upper extremity supported;Feet supported Sitting balance-Leahy Scale: Normal     Standing balance support: Bilateral upper extremity supported Standing balance-Leahy Scale: Fair                             ADL either performed or assessed with clinical judgement   ADL Overall ADL's : Needs assistance/impaired                                       General ADL Comments: MOD I seated grooming/eating tasks. CGA + RW for simulated BSC t/f. Anticipate MOD A for LBD.                   Pertinent Vitals/Pain Pain Assessment: 0-10 Pain Score: 9  Pain Location: L hip Pain Descriptors / Indicators: Aching;Grimacing Pain Intervention(s): Limited activity within  patient's tolerance;Repositioned;Patient requesting pain meds-RN notified     Hand Dominance Right   Extremity/Trunk Assessment Upper Extremity Assessment Upper Extremity Assessment: Overall WFL for tasks assessed   Lower Extremity Assessment Lower Extremity Assessment: Generalized weakness       Communication Communication Communication: No difficulties   Cognition Arousal/Alertness: Awake/alert Behavior During Therapy: WFL for tasks assessed/performed Overall  Cognitive Status: Within Functional Limits for tasks assessed                                 General Comments: Start of session pt recalled 2/3 posterior hip pcns   General Comments       Exercises Exercises: Other exercises Other Exercises Other Exercises: Pt and family educated re: OT role, DME recs, d/c recs, falls prevention, functinoal application of post hip pcns Other Exercises: Sup>sit, sit<>stand, sitting/standing balance/tolerance, ~49ft mobility   Shoulder Instructions      Home Living Family/patient expects to be discharged to:: Private residence Living Arrangements: Spouse/significant other Available Help at Discharge: Family;Available PRN/intermittently Type of Home: House Home Access: Stairs to enter [employer] of Steps: 1 Entrance Stairs-Rails: Right Home Layout: One level     Bathroom Shower/Tub: Tub/shower unit;Walk-in shower   Bathroom Toilet: Standard Bathroom Accessibility: Yes How Accessible: Accessible via walker Home Equipment: Walker - 2 wheels          Prior Functioning/Environment Level of Independence: Independent                 OT Problem List: Decreased strength;Decreased range of motion;Decreased activity tolerance;Impaired balance (sitting and/or standing);Decreased knowledge of precautions;Pain      OT Treatment/Interventions: Self-care/ADL training;Therapeutic exercise;Energy conservation;DME and/or AE instruction;Therapeutic activities;Patient/family education;Balance training    OT Goals(Current goals can be found in the care plan section) Acute Rehab OT Goals Patient Stated Goal: get back home OT Goal Formulation: With patient Time For Goal Achievement: 07/29/20 Potential to Achieve Goals: Good ADL Goals Pt Will Perform Grooming: with supervision;standing (c LRAD PRN) Pt Will Perform Lower Body Dressing: with adaptive equipment;sit to/from stand;with min guard assist (c LRAD PRN) Pt Will  Transfer to Toilet: with supervision;ambulating;bedside commode (c LRAD PRN)  OT Frequency: Min 2X/week   Barriers to D/C: Decreased caregiver support             AM-PAC OT "6 Clicks" Daily Activity     Outcome Measure Help from another person eating meals?: None Help from another person taking care of personal grooming?: None Help from another person toileting, which includes using toliet, bedpan, or urinal?: A Lot Help from another person bathing (including washing, rinsing, drying)?: A Lot Help from another person to put on and taking off regular upper body clothing?: None Help from another person to put on and taking off regular lower body clothing?: A Lot 6 Click Score: 18   End of Session Equipment Utilized During Treatment: Surveyor, mining Communication: Patient requests pain meds  Activity Tolerance: Patient tolerated treatment well Patient left: in chair;with call bell/phone within reach;with chair alarm set;with nursing/sitter in room;with family/visitor present  OT Visit Diagnosis: Other abnormalities of gait and mobility (R26.89);Muscle weakness (generalized) (M62.81)                Time: 2595-6387 OT Time Calculation (min): 23 min Charges:  OT General Charges $OT Visit: 1 Visit OT Evaluation $OT Eval Low Complexity: 1 Low OT Treatments $Self Care/Home Management : 8-22 mins  Dessie Coma,  M.S. OTR/L  07/15/20, 10:55 AM  ascom [phone]

## 2020-07-15 NOTE — Discharge Summary (Signed)
Physician Discharge Summary  Patient ID: Edward Pitts MRN: [id number] DOB/AGE: [birth year] 61 y.o.  Admit date: 07/14/2020 Discharge date: 07/15/2020  Admission Diagnoses:  Hx of total hip arthroplasty, left [Z96.642]  Surgeries:Procedure(s):  PROCEDURE:  Left total hip arthroplasty  SURGEON:  Marciano Sequin. M.D.  ASSISTANT: Cassell Smiles, PA-C (present and scrubbed throughout the case, critical for assistance with exposure, retraction, instrumentation, and closure)  ANESTHESIA: spinal  ESTIMATED BLOOD LOSS: 100 mL  FLUIDS REPLACED: 1400 mL of crystalloid  DRAINS: 2 medium Hemovac drains  IMPLANTS UTILIZED: DePuy 10.5 mm large stature AML femoral stem, 52 mm OD Pinnacle 100 acetabular component, neutral Pinnacle Altrx polyethylene insert, and a 36 mm M-SPEC +1.5 mm hip ball   Discharge Diagnoses: Patient Active Problem List   Diagnosis Date Noted  . Degenerative joint disease of knee 07/14/2020  . Other specified visual disturbances 07/14/2020  . Hx of total hip arthroplasty, left 07/14/2020  . Exposure to combat 04/20/2020  . Primary osteoarthritis of left hip 04/20/2020  . Dizziness 12/22/2019  . Arthritis of left knee 11/27/2019  . Vitiligo 09/03/2019  . Port-A-Cath in place 08/20/2019  . Encounter for antineoplastic immunotherapy 08/06/2019  . Malignant melanoma of overlapping sites ([place]) 04/23/2019  . Goals of care, counseling/discussion 04/23/2019  . 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 Near syncope 12/17/2018  . Anemia in chronic kidney disease 12/11/2018  . Benign hypertensive kidney disease with chronic kidney disease 12/11/2018  . Stage 3a chronic kidney disease ([place]) 12/11/2018  . AKI (acute kidney injury) ([place]) 08/24/2018  . Acute hyperkalemia 08/24/2018  . HTN (hypertension) 08/24/2018  . HLD (hyperlipidemia) 08/24/2018  . Seizures ([place]) 08/24/2018  . Complete heart block ([place]) 08/24/2018  . Arthritis of wrist, left  06/12/2018  . Bradycardia 06/04/2018  . 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 Positive ANA (antinuclear antibody) 03/05/2018  . Swelling of joint of left wrist 03/05/2018  . Coronary artery disease involving native coronary artery of native heart 07/09/2017  . Palpitations 07/09/2017  . Stable angina ([place]) 06/27/2017  . Benign essential HTN 05/25/2017  . LBBB (left bundle branch block) 05/25/2017    Past Medical History:  Diagnosis Date  . Anemia    iron treatments  . Anxiety   . Aortic atherosclerosis ([place])   . Arthritis   . 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 Cataract   . Complication of anesthesia    PONV  . Coronary artery disease   . Dizziness of unknown etiology    has led to seizures and passing out.  . Family history of adverse reaction to anesthesia    PONV mother  . GERD (gastroesophageal reflux disease)   . History of complete heart block    PPM placed  . Hyperlipidemia   . Hypertension   . LBBB (left bundle branch block)   . Lymphedema of left leg    uses thigh high compression stockings  . Melanoma ([place]) 2012   skin cancer, left thigh  . OSA on CPAP   . PONV (postoperative nausea and vomiting) 04/16/2019  . Port-A-Cath in place    RIGHT chest wall  . Presence of cardiac pacemaker    Medtronic  . Seizures ([place])    still has episodes of dizziness. last event 1 month ago (march 2022) and will pass out. takes clonazepam     Transfusion:    Consultants (if any):   Discharged Condition: Improved  Hospital  Course: Edward Pitts is an 60 y.o. male who was admitted 07/14/2020 with a diagnosis of left hip osteoarthritis and went to the operating room on 07/14/2020 and underwent left total hip arthroplasty through posterior approach. The patient received perioperative antibiotics for prophylaxis (see below). The patient tolerated the procedure well and was transported to PACU in stable condition. After meeting PACU  criteria, the patient was subsequently transferred to the Orthopaedics/Rehabilitation unit.   The patient received DVT prophylaxis in the form of early mobilization, Lovenox, Foot Pumps and TED hose. A sacral pad had been placed and heels were elevated off of the bed with rolled towels in order to protect skin integrity. Foley catheter was discontinued on postoperative day #0. Wound drains were discontinued on postoperative day #1. The surgical incision was healing well without signs of infection.  Physical therapy was initiated postoperatively for transfers, gait training, and strengthening. Occupational therapy was initiated for activities of daily living and evaluation for assisted devices. Rehabilitation goals were reviewed in detail with the patient. The patient made steady progress with physical therapy and physical therapy recommended discharge to Home.   The patient achieved the preliminary goals of this hospitalization and was felt to be medically and orthopaedically appropriate for discharge.  He was given perioperative antibiotics:  Anti-infectives (From admission, onward)   Start     Dose/Rate Route Frequency Ordered Stop   07/14/20 1430  ceFAZolin (ANCEF) IVPB 2g/100 mL premix        2 g 200 mL/hr over 30 Minutes Intravenous Every 6 hours 07/14/20 1341 07/14/20 2328   07/14/20 0732  ceFAZolin (ANCEF) 2-4 GM/100ML-% IVPB       Note to Pharmacy: Norton Blizzard  : cabinet override      07/14/20 0732 07/14/20 0909   07/14/20 0600  ceFAZolin (ANCEF) IVPB 2g/100 mL premix        2 g 200 mL/hr over 30 Minutes Intravenous On call to O.R. 07/14/20 0973 07/14/20 0903    .  Recent vital signs:  Vitals:   07/15/20 0814 07/15/20 1136  BP: (!) 130/57 131/60  Pulse: 70 68  Resp: 19 18  Temp: 98 F (36.7 C) 98.6 F (37 C)  SpO2: 99% 98%    Recent laboratory studies:  No results for input(s): WBC, HGB, HCT, PLT, K, CL, CO2, BUN, CREATININE, GLUCOSE, CALCIUM, LABPT, INR in the last  72 hours.  Diagnostic Studies: CT CHEST ABDOMEN PELVIS W CONTRAST  Result Date: 06/30/2020 CLINICAL DATA:  Metastatic melanoma, left calf, metastatic to left inguinal lymph nodes,, status post resection and radiation therapy, ongoing immunotherapy EXAM: CT CHEST, ABDOMEN, AND PELVIS WITH CONTRAST TECHNIQUE: Multidetector CT imaging of the chest, abdomen and pelvis was performed following the standard protocol during bolus administration of intravenous contrast. CONTRAST:  137mL OMNIPAQUE IOHEXOL 300 MG/ML SOLN, additional oral enteric contrast COMPARISON:  03/31/2020 FINDINGS: CT CHEST FINDINGS Cardiovascular: Right chest port catheter. Left chest multi lead pacer. Normal heart size. Left coronary artery calcifications. No pericardial effusion. Mediastinum/Nodes: No enlarged mediastinal, hilar, or axillary lymph nodes. Thyroid gland, trachea, and esophagus demonstrate no significant findings. Lungs/Pleura: Lungs are clear. No pleural effusion or pneumothorax. Musculoskeletal: No chest wall mass or suspicious bone lesions identified. CT ABDOMEN PELVIS FINDINGS Hepatobiliary: No solid liver abnormality is seen. Stable subcentimeter low-attenuation lesion of the peripheral right lobe of the liver (series 2, image 55). Hepatic steatosis. No gallstones, gallbladder wall thickening, or biliary dilatation. Pancreas: Unremarkable. No pancreatic ductal dilatation or surrounding inflammatory changes. Spleen: Normal in  size without significant abnormality. Adrenals/Urinary Tract: Adrenal glands are unremarkable. Kidneys are normal, without renal calculi, solid lesion, or hydronephrosis. Bladder is unremarkable. Stomach/Bowel: Stomach is within normal limits. Appendix appears normal. No evidence of bowel wall thickening, distention, or inflammatory changes. Vascular/Lymphatic: Aortic atherosclerosis. No enlarged abdominal or pelvic lymph nodes. Unchanged findings of left inguinal lymph node resection with overlying skin  thickening and fat stranding in keeping with radiation therapy. Reproductive: No mass or other abnormality. Other: Fat containing umbilical hernia.  No abdominopelvic ascites. Musculoskeletal: No acute or significant osseous findings. IMPRESSION: 1. Stable postoperative appearance of the left groin. No evidence of local recurrence. 2. No evidence of metastatic disease in the chest, abdomen, or pelvis. 3. Hepatic steatosis. 4. Stable subcentimeter fluid attenuation lesion of the lateral right lobe of the liver, again almost certainly an incidental, benign cyst or hemangioma. 5. Coronary artery disease. Aortic Atherosclerosis (ICD10-I70.0). Electronically Signed   By: Eddie Candle M.D.   On: 06/30/2020 09:20   DG Hip Port Unilat With Pelvis 1V Left  Result Date: 07/14/2020 CLINICAL DATA:  Status post hip replacement. EXAM: DG HIP (WITH OR WITHOUT PELVIS) 1V PORT LEFT COMPARISON:  01/22/2020. FINDINGS: Patient status post total left hip replacement. Hardware intact. Anatomic alignment. No acute abnormality. IMPRESSION: Patient status post total left hip replacement. Hardware intact. Anatomic alignment. Electronically Signed   By: Marcello Moores  Register   On: 07/14/2020 15:08    Discharge Medications:   Allergies as of 07/15/2020      Reactions   Ibuprofen Other (See Comments)   Affects kidneys   Nsaids    Affects kidneys      Medication List    STOP taking these medications   aspirin 81 MG chewable tablet     TAKE these medications   allopurinol 300 MG tablet Commonly known as: ZYLOPRIM Take 300 mg by mouth daily.   atorvastatin 20 MG tablet Commonly known as: LIPITOR Take 20 mg by mouth daily.   clonazePAM 0.5 MG tablet Commonly known as: KLONOPIN 1 tablet in the morning, 2 in the evening What changed:   how much to take  how to take this  when to take this   enoxaparin 40 MG/0.4ML injection Commonly known as: LOVENOX Inject 0.4 mLs (40 mg total) into the skin daily for 14 days.    hydrocortisone 2.5 % ointment Apply topically 2 (two) times daily.   ketoconazole 2 % cream Commonly known as: NIZORAL Apply 1 application topically daily.   lidocaine-prilocaine cream Commonly known as: EMLA Apply 1 application topically as needed. Apply small amount of cream to port site approx 1-2 hours prior to appointment.   lisinopril 20 MG tablet Commonly known as: ZESTRIL Take 20 mg by mouth daily.   metoprolol succinate 25 MG 24 hr tablet Commonly known as: TOPROL-XL Take 25 mg by mouth daily.   multivitamin with minerals Tabs tablet Take 1 tablet by mouth daily. Centrum Silver   omeprazole 20 MG capsule Commonly known as: PRILOSEC Take 1 capsule (20 mg total) by mouth daily.   ondansetron 4 MG tablet Commonly known as: ZOFRAN TAKE 1 TABLET BY MOUTH EVERY 8 HOURS AS NEEDED FOR NAUSEA AND VOMITING What changed: See the new instructions.   oxyCODONE 5 MG immediate release tablet Commonly known as: Oxy IR/ROXICODONE Take 1 tablet (5 mg total) by mouth every 4 (four) hours as needed for moderate pain (pain score 4-6).   traMADol 50 MG tablet Commonly known as: ULTRAM Take 1 tablet (50 mg  total) by mouth every 12 (twelve) hours as needed.            Durable Medical Equipment  (From admission, onward)         Start     Ordered   07/14/20 1341  DME Walker rolling  Once       Question:  Patient needs a walker to treat with the following condition  Answer:  S/P total hip arthroplasty   07/14/20 1341   07/14/20 1341  DME Bedside commode  Once       Question:  Patient needs a bedside commode to treat with the following condition  Answer:  S/P total hip arthroplasty   07/14/20 1341          Disposition: Home with home health PT     Follow-up Information    Dereck Leep, MD On 08/31/2020.   Specialty: Orthopedic Surgery Why: at 11:00am Contact information: [address] [address] [place], PA-C 07/15/2020, 12:12 PM

## 2020-07-27 ENCOUNTER — Other Ambulatory Visit: Payer: Self-pay

## 2020-07-27 ENCOUNTER — Inpatient Hospital Stay (HOSPITAL_BASED_OUTPATIENT_CLINIC_OR_DEPARTMENT_OTHER): Admitting: Oncology

## 2020-07-27 ENCOUNTER — Encounter: Payer: Self-pay | Admitting: Oncology

## 2020-07-27 ENCOUNTER — Inpatient Hospital Stay: Attending: Oncology

## 2020-07-27 ENCOUNTER — Inpatient Hospital Stay

## 2020-07-27 VITALS — BP 115/63 | HR 87 | Temp 97.4°F | Resp 16 | Wt 230.1 lb

## 2020-07-27 DIAGNOSIS — L80 Vitiligo: Secondary | ICD-10-CM | POA: Insufficient documentation

## 2020-07-27 DIAGNOSIS — Z5112 Encounter for antineoplastic immunotherapy: Secondary | ICD-10-CM | POA: Diagnosis present

## 2020-07-27 DIAGNOSIS — D649 Anemia, unspecified: Secondary | ICD-10-CM | POA: Diagnosis not present

## 2020-07-27 DIAGNOSIS — Z96642 Presence of left artificial hip joint: Secondary | ICD-10-CM | POA: Diagnosis not present

## 2020-07-27 DIAGNOSIS — C438 Malignant melanoma of overlapping sites of skin: Secondary | ICD-10-CM | POA: Diagnosis not present

## 2020-07-27 DIAGNOSIS — E119 Type 2 diabetes mellitus without complications: Secondary | ICD-10-CM | POA: Insufficient documentation

## 2020-07-27 DIAGNOSIS — C4359 Malignant melanoma of other part of trunk: Secondary | ICD-10-CM | POA: Insufficient documentation

## 2020-07-27 LAB — COMPREHENSIVE METABOLIC PANEL
ALT: 34 U/L (ref 0–44)
AST: 28 U/L (ref 15–41)
Albumin: 3.6 g/dL (ref 3.5–5.0)
Alkaline Phosphatase: 72 U/L (ref 38–126)
Anion gap: 11 (ref 5–15)
BUN: 26 mg/dL — ABNORMAL HIGH (ref 6–20)
CO2: 23 mmol/L (ref 22–32)
Calcium: 9.2 mg/dL (ref 8.9–10.3)
Chloride: 99 mmol/L (ref 98–111)
Creatinine, Ser: 1.3 mg/dL — ABNORMAL HIGH (ref 0.61–1.24)
GFR, Estimated: >60 mL/min (ref >60)
Glucose, Bld: 153 mg/dL — ABNORMAL HIGH (ref 70–99)
Potassium: 4.1 mmol/L (ref 3.5–5.1)
Sodium: 133 mmol/L — ABNORMAL LOW (ref 135–145)
Total Bilirubin: 0.5 mg/dL (ref 0.3–1.2)
Total Protein: 7 g/dL (ref 6.5–8.1)

## 2020-07-27 LAB — CBC WITH DIFFERENTIAL/PLATELET
Abs Immature Granulocytes: 0.08 10*3/uL — ABNORMAL HIGH (ref 0.00–0.07)
Basophils Absolute: 0 10*3/uL (ref 0.0–0.1)
Basophils Relative: 0 %
Eosinophils Absolute: 0.1 10*3/uL (ref 0.0–0.5)
Eosinophils Relative: 2 %
HCT: 28.8 % — ABNORMAL LOW (ref 39.0–52.0)
Hemoglobin: 9.4 g/dL — ABNORMAL LOW (ref 13.0–17.0)
Immature Granulocytes: 1 %
Lymphocytes Relative: 13 %
Lymphs Abs: 0.9 10*3/uL (ref 0.7–4.0)
MCH: 29.4 pg (ref 26.0–34.0)
MCHC: 32.6 g/dL (ref 30.0–36.0)
MCV: 90 fL (ref 80.0–100.0)
Monocytes Absolute: 0.4 10*3/uL (ref 0.1–1.0)
Monocytes Relative: 7 %
Neutro Abs: 5.2 10*3/uL (ref 1.7–7.7)
Neutrophils Relative %: 77 %
Platelets: 315 10*3/uL (ref 150–400)
RBC: 3.2 MIL/uL — ABNORMAL LOW (ref 4.22–5.81)
RDW: 12.6 % (ref 11.5–15.5)
WBC: 6.7 10*3/uL (ref 4.0–10.5)
nRBC: 0 % (ref 0.0–0.2)

## 2020-07-27 LAB — LACTATE DEHYDROGENASE: LDH: 151 U/L (ref 98–192)

## 2020-07-27 MED ORDER — SODIUM CHLORIDE 0.9 % IV SOLN
240.0000 mg | Freq: Once | INTRAVENOUS | Status: AC
  Administered 2020-07-27: 240 mg via INTRAVENOUS
  Filled 2020-07-27: qty 24

## 2020-07-27 MED ORDER — HEPARIN SOD (PORK) LOCK FLUSH 100 UNIT/ML IV SOLN
500.0000 [IU] | Freq: Once | INTRAVENOUS | Status: AC | PRN
  Administered 2020-07-27: 500 [IU]
  Filled 2020-07-27: qty 5

## 2020-07-27 MED ORDER — SODIUM CHLORIDE 0.9 % IV SOLN
Freq: Once | INTRAVENOUS | Status: AC
Start: 2020-07-27 — End: 2020-07-27
  Filled 2020-07-27: qty 250

## 2020-07-27 NOTE — Progress Notes (Signed)
Patient had hip replacement surgery on 07/14/2020.

## 2020-07-27 NOTE — Patient Instructions (Signed)
[facility] [place] [facility]  Discharge Instructions: Thank you for choosing [facility] to provide your oncology and hematology care.  If you have a lab appointment with the Cancer Center, please go directly to the Cancer Center and check in at the registration area.  Wear comfortable clothing and clothing appropriate for easy access to any Portacath or PICC line.   We strive to give you quality time with your provider. You may need to reschedule your appointment if you arrive late (15 or more minutes).  Arriving late affects you and other patients whose appointments are after yours.  Also, if you miss three or more appointments without notifying the office, you may be dismissed from the clinic at the provider's discretion.      For prescription refill requests, have your pharmacy contact our office and allow 72 hours for refills to be completed.    Today you received the following chemotherapy and/or immunotherapy agents Nivolumab       To help prevent nausea and vomiting after your treatment, we encourage you to take your nausea medication as directed.  BELOW ARE SYMPTOMS THAT SHOULD BE REPORTED IMMEDIATELY: *FEVER GREATER THAN 100.4 F (38 C) OR HIGHER *CHILLS OR SWEATING *NAUSEA AND VOMITING THAT IS NOT CONTROLLED WITH YOUR NAUSEA MEDICATION *UNUSUAL SHORTNESS OF BREATH *UNUSUAL BRUISING OR BLEEDING *URINARY PROBLEMS (pain or burning when urinating, or frequent urination) *BOWEL PROBLEMS (unusual diarrhea, constipation, pain near the anus) TENDERNESS IN MOUTH AND THROAT WITH OR WITHOUT PRESENCE OF ULCERS (sore throat, sores in mouth, or a toothache) UNUSUAL RASH, SWELLING OR PAIN  UNUSUAL VAGINAL DISCHARGE OR ITCHING   Items with * indicate a potential emergency and should be followed up as soon as possible or go to the Emergency Department if any problems should occur.  Please show the CHEMOTHERAPY ALERT CARD or IMMUNOTHERAPY ALERT CARD at check-in  to the Emergency Department and triage nurse.  Should you have questions after your visit or need to cancel or reschedule your appointment, please contact [facility] [place] [facility]  [phone] and follow the prompts.  Office hours are 8:00 a.m. to 4:30 p.m. Monday - Friday. Please note that voicemails left after 4:00 p.m. may not be returned until the following business day.  We are closed weekends and major holidays. You have access to a nurse at all times for urgent questions. Please call the main number to the clinic [phone] and follow the prompts.  For any non-urgent questions, you may also contact your provider using MyChart. We now offer e-Visits for anyone 18 and older to request care online for non-urgent symptoms. For details visit [email or web address].   Also download the MyChart app! Go to the app store, search "MyChart", open the app, select [facility], and log in with your MyChart username and password.  Due to Covid, a mask is required upon entering the hospital/clinic. If you do not have a mask, one will be given to you upon arrival. For doctor visits, patients may have 1 support person aged 18 or older with them. For treatment visits, patients cannot have anyone with them due to current Covid guidelines and our immunocompromised population.  

## 2020-07-27 NOTE — Progress Notes (Signed)
Hematology/Oncology follow up  note [facility] [phone] [phone]   Patient Care Team: [facility] [place] as PCP - General [name], MD as Consulting Physician (Oncology)  REFERRING PROVIDER: [facility]  CHIEF COMPLAINTS/REASON FOR VISIT:  Follow up for melanoma HISTORY OF PRESENTING ILLNESS:   Edward Pitts is a  60 y.o.  male with PMH listed below was seen in consultation at the request of  [facility]  for evaluation of inguinal mass Patient presented to emergency room 3 days ago complaining about left ing uinal mass discomfort. Reports that he has really noticed the mass growing for the past 1 months. He has a history of left lower extremity melanoma in 2011, status post local excision.  Pain was increased with squatting of laxation. He was advised to take Tylenol for pain. Denies any fever, chills, night sweating.  He does feel mild nauseated. Appetite is fair.  He has lost about 10 pounds since earlier this year. 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  Left inguinal and left iliac nodes which are suspicious for involvement.  There are also 2 small nonspecific hypodense lesions within the right liver, nonspecific.  # patient underwent left groin mass resection On 04/16/2019. Resection pathology showed malignant melanoma, replacing a lymph node, with extracapsular extension, peripheral and deep margins involved.  Left inguinal contents, all 7 lymph nodes were negative for melanoma in the lymph nodes.  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  # PDL1 80% TPS  # 04/16/2019. underwent left groin mass resection   07/07/2019  Status post adjuvant radiation and finished radiation  Patient has Mediport placed to facilitate immunotherapy treatments.  06/29/2020, CT  chest abdomen pelvis showed stable postoperative appearance of the left groin.  No evidence of local recurrence.  No evidence of metastatic disease in the chest abdomen or pelvis.  Hepatic steatosis.  Stable subcentimeter fluid attenuation lesion of the lateral right lobe of the liver, likely benign cyst or hemangioma.  Coronary artery disease.  Aortic atherosclerosis   INTERVAL HISTORY Edward Pitts is a 60 y.o. male who has above history reviewed by me today presents for follow up visit for management of inguinal nodal recurrence of melanoma Problems and complaints are listed below:  Patient had hip replacement.  He tolerated procedure well.  He is on prophylactic Lovenox and is about to finish his course and restart his aspirin 81 mg.  Pain is controlled with narcotics He felt some dizziness yesterday when working with physical therapist.  Symptoms resolved after taking a brief rest.  No other new complaints.   . :Review of Systems  Constitutional: Negative for appetite change, chills, fatigue, fever and unexpected weight change.  HENT:   Negative for hearing loss and voice change.   Eyes: Negative for eye problems and icterus ( ).  Respiratory: Negative for chest tightness, cough and shortness of breath.   Cardiovascular: Negative for chest pain and leg swelling.  Gastrointestinal: Negative for abdominal distention and abdominal pain.  Endocrine: Negative for hot flashes.  Genitourinary: Negative for difficulty urinating, dysuria and frequency.   Musculoskeletal: Positive for arthralgias.       Status post left hip replacement  Skin: Negative for itching and rash.       Skin hypo-pigmentation on upper extremities, no change  Neurological: Negative for light-headedness and numbness.  Hematological: Negative for  adenopathy. Does not bruise/bleed easily.  Psychiatric/Behavioral: Negative for confusion.    MEDICAL HISTORY:  Past Medical History:  Diagnosis Date  . Anemia    iron  treatments  . Anxiety   . Aortic atherosclerosis ([place])   . Arthritis   . 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 Cataract   . Complication of anesthesia    PONV  . Coronary artery disease   . Dizziness of unknown etiology    has led to seizures and passing out.  . Family history of adverse reaction to anesthesia    PONV mother  . GERD (gastroesophageal reflux disease)   . History of complete heart block    PPM placed  . Hyperlipidemia   . Hypertension   . LBBB (left bundle branch block)   . Lymphedema of left leg    uses thigh high compression stockings  . Melanoma ([place]) 2012   skin cancer, left thigh  . OSA on CPAP   . PONV (postoperative nausea and vomiting) 04/16/2019  . Port-A-Cath in place    RIGHT chest wall  . Presence of cardiac pacemaker    [employer]  . Seizures ([place])    still has episodes of dizziness. last event 1 month ago (march 2022) and will pass out. takes clonazepam    SURGICAL HISTORY: Past Surgical History:  Procedure Laterality Date  . CT RADIATION THERAPY GUIDE     left groin  . dental implant     permanent implant  . KNEE SURGERY Left    arthroscopy  . LEFT HEART CATH AND CORONARY ANGIOGRAPHY Left 06/29/2017   Procedure: LEFT HEART CATH AND CORONARY ANGIOGRAPHY;  Surgeon: [name], MD;  Location: [facility];  Service: Cardiovascular;  Laterality: Left;  . LYMPH NODE DISSECTION Left 04/16/2019   Procedure: Left inguinal Lymph Node Dissection;  Surgeon: [name], MD;  Location: [facility];  Service: General;  Laterality: Left;  Marland Kitchen MELANOMA EXCISION Left 04/16/2019   Procedure: MELANOMA EXCISION LEFT GROIN MASS;  Surgeon: [name], MD;  Location: [facility];  Service: General;  Laterality: Left;  Marland Kitchen MELANOMA EXCISION WITH SENTINEL LYMPH NODE BIOPSY Left 2012   Left calf   . PACEMAKER INSERTION N/A 08/26/2018   Procedure: INSERTION PACEMAKER;  Surgeon: [name], MD;  Location: [facility];  Service:  Cardiovascular;  Laterality: N/A;  . PORTA CATH INSERTION N/A 08/26/2019   Procedure: PORTA CATH INSERTION;  Surgeon: Katha Cabal, MD;  Location: [facility];  Service: Cardiovascular;  Laterality: N/A;  . SUPERFICIAL LYMPH NODE BIOPSY / EXCISION Left 2020   lymph nodes removed around left groin melanoma site  . TEMPORARY PACEMAKER N/A 08/25/2018   Procedure: TEMPORARY PACEMAKER;  Surgeon: [name], MD;  Location: [facility];  Service: Cardiovascular;  Laterality: N/A;  . TOTAL HIP ARTHROPLASTY Left 07/14/2020   Procedure: TOTAL HIP ARTHROPLASTY;  Surgeon: Dereck Leep, MD;  Location: [facility];  Service: Orthopedics;  Laterality: Left;    SOCIAL HISTORY: Social History   Socioeconomic History  . Marital status: Married    Spouse name: Vicente Males   . Number of children: 7  . Years of education: 74  . Highest education level: Not on file  Occupational History    Comment: disability  Tobacco Use  . Smoking status: Never Smoker  . Smokeless tobacco: Never Used  Vaping Use  . Vaping Use: Never used  Substance and Sexual Activity  . Alcohol  use: No  . Drug use: No  . Sexual activity: Not Currently  Other Topics Concern  . Not on file  Social History Narrative   Lives with  Wife,   Has 2 small dogs   Caffeine use: sodas (2 per day)      Out of work on disability.  Has a walk in shower. No stairs to climb   Oncology treatment ongoing. Uses port a cath for treatment.      pacemaker   Social Determinants of Health   Financial Resource Strain: Not on file  Food Insecurity: Not on file  Transportation Needs: Not on file  Physical Activity: Not on file  Stress: Not on file  Social Connections: Not on file  Intimate Partner Violence: Not on file    FAMILY HISTORY: Family History  Problem Relation Age of Onset  . Cancer Paternal Grandmother     ALLERGIES:  is allergic to ibuprofen and nsaids.  MEDICATIONS:  Current Outpatient Medications   Medication Sig Dispense Refill  . allopurinol (ZYLOPRIM) 300 MG tablet Take 300 mg by mouth daily.    Marland Kitchen atorvastatin (LIPITOR) 20 MG tablet Take 20 mg by mouth daily.    . clonazePAM (KLONOPIN) 0.5 MG tablet 1 tablet in the morning, 2 in the evening (Patient taking differently: Take 0.5 mg by mouth 2 (two) times daily. 1 tablet in the morning, 2 in the evening) 90 tablet 3  . enoxaparin (LOVENOX) 40 MG/0.4ML injection Inject 0.4 mLs (40 mg total) into the skin daily for 14 days. 5.6 mL 0  . hydrocortisone 2.5 % ointment Apply topically 2 (two) times daily.    Marland Kitchen ketoconazole (NIZORAL) 2 % cream Apply 1 application topically daily.    Marland Kitchen lidocaine-prilocaine (EMLA) cream Apply 1 application topically as needed. Apply small amount of cream to port site approx 1-2 hours prior to appointment. 30 g 2  . lisinopril (ZESTRIL) 20 MG tablet Take 20 mg by mouth daily.    . metoprolol succinate (TOPROL-XL) 25 MG 24 hr tablet Take 25 mg by mouth daily.     . Multiple Vitamin (MULTIVITAMIN WITH MINERALS) TABS tablet Take 1 tablet by mouth daily. Centrum Silver    . omeprazole (PRILOSEC) 20 MG capsule Take 1 capsule (20 mg total) by mouth daily. 30 capsule 1  . ondansetron (ZOFRAN) 4 MG tablet TAKE 1 TABLET BY MOUTH EVERY 8 HOURS AS NEEDED FOR NAUSEA AND VOMITING (Patient taking differently: Take 4 mg by mouth every 8 (eight) hours as needed for vomiting or nausea.) 90 tablet 1  . oxyCODONE (OXY IR/ROXICODONE) 5 MG immediate release tablet Take 1 tablet (5 mg total) by mouth every 4 (four) hours as needed for moderate pain (pain score 4-6). 30 tablet 0  . traMADol (ULTRAM) 50 MG tablet Take 1 tablet (50 mg total) by mouth every 12 (twelve) hours as needed. 60 tablet 0   No current facility-administered medications for this visit.     PHYSICAL EXAMINATION: ECOG PERFORMANCE STATUS: 1 - Symptomatic but completely ambulatory Vitals:   07/27/20 0906  BP: 115/63  Pulse: 87  Resp: 16  Temp: (!) 97.4 F (36.3  C)   Filed Weights   07/27/20 0906  Weight: 230 lb 1.6 oz (104.4 kg)    Physical Exam Constitutional:      General: He is not in acute distress. HENT:     Head: Normocephalic and atraumatic.  Eyes:     General: No scleral icterus.    Pupils: Pupils are  equal, round, and reactive to light.  Cardiovascular:     Rate and Rhythm: Normal rate and regular rhythm.     Heart sounds: Normal heart sounds.  Pulmonary:     Effort: Pulmonary effort is normal. No respiratory distress.     Breath sounds: No wheezing.  Abdominal:     General: Bowel sounds are normal. There is no distension.     Palpations: Abdomen is soft. There is no mass.     Tenderness: There is no abdominal tenderness.     Comments:  History left groin mass resection and radiation.   Musculoskeletal:        General: No deformity. Normal range of motion.     Cervical back: Normal range of motion and neck supple.     Comments: Left lower extremity edema Left hip replacement  Skin:    General: Skin is warm and dry.     Findings: No erythema or rash.  Neurological:     Mental Status: He is alert and oriented to person, place, and time. Mental status is at baseline.     Cranial Nerves: No cranial nerve deficit.     Coordination: Coordination normal.  Psychiatric:        Mood and Affect: Mood normal.    LABORATORY DATA:  I have reviewed the data as listed Lab Results  Component Value Date   WBC 6.7 07/27/2020   HGB 9.4 (L) 07/27/2020   HCT 28.8 (L) 07/27/2020   MCV 90.0 07/27/2020   PLT 315 07/27/2020   Recent Labs    11/27/19 0833 12/11/19 0854 12/25/19 0905 01/08/20 0851 07/06/20 0829 07/07/20 1259 07/27/20 0839  NA 140 139 140   < > 139 137 133*  K 4.1 3.8 4.4   < > 4.2 4.3 4.1  CL 106 104 106   < > 104 103 99  CO2 $Re'24 27 27   'SXk$ < > $R'25 25 23  'LQ$ GLUCOSE 125* 118* 109*   < > 116* 98 153*  BUN 20 25* 20   < > 23* 29* 26*  CREATININE 1.10 1.16 1.22   < > 1.40* 1.16 1.30*  CALCIUM 9.0 9.1 9.1   < > 9.6  9.7 9.2  GFRNONAA >60 >60 >60   < > 58* >60 >60  GFRAA >60 >60 >60  --   --   --   --   PROT 7.1 7.3 7.0   < > 7.6 7.6 7.0  ALBUMIN 4.0 4.2 4.1   < > 4.2 4.1 3.6  AST $Re'29 28 26   'ytM$ < > $R'26 26 28  'Jm$ ALT 40 39 32   < > 37 32 34  ALKPHOS 89 80 80   < > 86 92 72  BILITOT 0.6 0.8 0.9   < > 0.7 0.8 0.5   < > = values in this interval not displayed.   Iron/TIBC/Ferritin/ %Sat    Component Value Date/Time   IRON 85 10/02/2019 0841   TIBC 342 10/02/2019 0841   FERRITIN 61 10/02/2019 0841   IRONPCTSAT 25 10/02/2019 0841      RADIOGRAPHIC STUDIES: I have personally reviewed the radiological images as listed and agreed with the findings in the report. CT CHEST ABDOMEN PELVIS W CONTRAST  Result Date: 06/30/2020 CLINICAL DATA:  Metastatic melanoma, left calf, metastatic to left inguinal lymph nodes,, status post resection and radiation therapy, ongoing immunotherapy EXAM: CT CHEST, ABDOMEN, AND PELVIS WITH CONTRAST TECHNIQUE: Multidetector CT imaging of the chest, abdomen and pelvis  was performed following the standard protocol during bolus administration of intravenous contrast. CONTRAST:  122mL OMNIPAQUE IOHEXOL 300 MG/ML SOLN, additional oral enteric contrast COMPARISON:  03/31/2020 FINDINGS: CT CHEST FINDINGS Cardiovascular: Right chest port catheter. Left chest multi lead pacer. Normal heart size. Left coronary artery calcifications. No pericardial effusion. Mediastinum/Nodes: No enlarged mediastinal, hilar, or axillary lymph nodes. Thyroid gland, trachea, and esophagus demonstrate no significant findings. Lungs/Pleura: Lungs are clear. No pleural effusion or pneumothorax. Musculoskeletal: No chest wall mass or suspicious bone lesions identified. CT ABDOMEN PELVIS FINDINGS Hepatobiliary: No solid liver abnormality is seen. Stable subcentimeter low-attenuation lesion of the peripheral right lobe of the liver (series 2, image 55). Hepatic steatosis. No gallstones, gallbladder wall thickening, or biliary  dilatation. Pancreas: Unremarkable. No pancreatic ductal dilatation or surrounding inflammatory changes. Spleen: Normal in size without significant abnormality. Adrenals/Urinary Tract: Adrenal glands are unremarkable. Kidneys are normal, without renal calculi, solid lesion, or hydronephrosis. Bladder is unremarkable. Stomach/Bowel: Stomach is within normal limits. Appendix appears normal. No evidence of bowel wall thickening, distention, or inflammatory changes. Vascular/Lymphatic: Aortic atherosclerosis. No enlarged abdominal or pelvic lymph nodes. Unchanged findings of left inguinal lymph node resection with overlying skin thickening and fat stranding in keeping with radiation therapy. Reproductive: No mass or other abnormality. Other: Fat containing umbilical hernia.  No abdominopelvic ascites. Musculoskeletal: No acute or significant osseous findings. IMPRESSION: 1. Stable postoperative appearance of the left groin. No evidence of local recurrence. 2. No evidence of metastatic disease in the chest, abdomen, or pelvis. 3. Hepatic steatosis. 4. Stable subcentimeter fluid attenuation lesion of the lateral right lobe of the liver, again almost certainly an incidental, benign cyst or hemangioma. 5. Coronary artery disease. Aortic Atherosclerosis (ICD10-I70.0). Electronically Signed   By: Eddie Candle M.D.   On: 06/30/2020 09:20   DG Hip Port Unilat With Pelvis 1V Left  Result Date: 07/14/2020 CLINICAL DATA:  Status post hip replacement. EXAM: DG HIP (WITH OR WITHOUT PELVIS) 1V PORT LEFT COMPARISON:  01/22/2020. FINDINGS: Patient status post total left hip replacement. Hardware intact. Anatomic alignment. No acute abnormality. IMPRESSION: Patient status post total left hip replacement. Hardware intact. Anatomic alignment. Electronically Signed   By: Marcello Moores  Register   On: 07/14/2020 15:08       ASSESSMENT & PLAN:  1. Malignant melanoma of overlapping sites ([place])   2. Encounter for antineoplastic  immunotherapy   3. History of left hip replacement    #Left inguinal nodal recurrence of melanoma.  Status post resection and adjuvant radiation. Stage IV Recurrent, now in NED Labs were reviewed and discussed with patient Proceed with nivolumab treatment today.   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 Pain management per orthopedic surgeon.  #Vitiligo, stable #Normocytic anemia, hemoglobin has dropped to 9.4. Elevated creatinine, encourage patient to increase oral hydration.  Avoid nephrotoxins. All questions were answered. The patient knows to call the clinic with any problems questions or concerns.  Follow-up 2 weeks We spent sufficient time to discuss many aspect of care, questions were answered to patient's satisfaction.   [name], MD, PhD Hematology Oncology [facility] at [facility] Pager- [phone] 07/27/2020

## 2020-07-27 NOTE — Progress Notes (Signed)
Treat up to one year per physician communication order. Per Dr. Tasia Catchings okay to proceed with Nivolumab at this time, plan can be up to 2 years.

## 2020-07-30 ENCOUNTER — Other Ambulatory Visit: Payer: Self-pay | Admitting: Oncology

## 2020-08-10 ENCOUNTER — Inpatient Hospital Stay (HOSPITAL_BASED_OUTPATIENT_CLINIC_OR_DEPARTMENT_OTHER): Admitting: Oncology

## 2020-08-10 ENCOUNTER — Other Ambulatory Visit: Payer: Self-pay

## 2020-08-10 ENCOUNTER — Encounter: Payer: Self-pay | Admitting: Oncology

## 2020-08-10 ENCOUNTER — Inpatient Hospital Stay

## 2020-08-10 VITALS — BP 134/67 | HR 73 | Temp 98.2°F | Resp 18 | Wt 229.4 lb

## 2020-08-10 DIAGNOSIS — M2550 Pain in unspecified joint: Secondary | ICD-10-CM | POA: Diagnosis not present

## 2020-08-10 DIAGNOSIS — C438 Malignant melanoma of overlapping sites of skin: Secondary | ICD-10-CM

## 2020-08-10 DIAGNOSIS — D649 Anemia, unspecified: Secondary | ICD-10-CM

## 2020-08-10 DIAGNOSIS — Z96642 Presence of left artificial hip joint: Secondary | ICD-10-CM

## 2020-08-10 DIAGNOSIS — Z5112 Encounter for antineoplastic immunotherapy: Secondary | ICD-10-CM | POA: Diagnosis not present

## 2020-08-10 LAB — CBC WITH DIFFERENTIAL/PLATELET
Abs Immature Granulocytes: 0.03 10*3/uL (ref 0.00–0.07)
Basophils Absolute: 0 10*3/uL (ref 0.0–0.1)
Basophils Relative: 0 %
Eosinophils Absolute: 0.1 10*3/uL (ref 0.0–0.5)
Eosinophils Relative: 2 %
HCT: 30.6 % — ABNORMAL LOW (ref 39.0–52.0)
Hemoglobin: 9.9 g/dL — ABNORMAL LOW (ref 13.0–17.0)
Immature Granulocytes: 1 %
Lymphocytes Relative: 16 %
Lymphs Abs: 0.6 10*3/uL — ABNORMAL LOW (ref 0.7–4.0)
MCH: 29.5 pg (ref 26.0–34.0)
MCHC: 32.4 g/dL (ref 30.0–36.0)
MCV: 91.1 fL (ref 80.0–100.0)
Monocytes Absolute: 0.4 10*3/uL (ref 0.1–1.0)
Monocytes Relative: 10 %
Neutro Abs: 2.9 10*3/uL (ref 1.7–7.7)
Neutrophils Relative %: 71 %
Platelets: 229 10*3/uL (ref 150–400)
RBC: 3.36 MIL/uL — ABNORMAL LOW (ref 4.22–5.81)
RDW: 13.2 % (ref 11.5–15.5)
WBC: 4 10*3/uL (ref 4.0–10.5)
nRBC: 0 % (ref 0.0–0.2)

## 2020-08-10 LAB — COMPREHENSIVE METABOLIC PANEL
ALT: 28 U/L (ref 0–44)
AST: 22 U/L (ref 15–41)
Albumin: 3.7 g/dL (ref 3.5–5.0)
Alkaline Phosphatase: 90 U/L (ref 38–126)
Anion gap: 11 (ref 5–15)
BUN: 21 mg/dL — ABNORMAL HIGH (ref 6–20)
CO2: 22 mmol/L (ref 22–32)
Calcium: 8.8 mg/dL — ABNORMAL LOW (ref 8.9–10.3)
Chloride: 106 mmol/L (ref 98–111)
Creatinine, Ser: 1.11 mg/dL (ref 0.61–1.24)
GFR, Estimated: >60 mL/min (ref >60)
Glucose, Bld: 103 mg/dL — ABNORMAL HIGH (ref 70–99)
Potassium: 3.9 mmol/L (ref 3.5–5.1)
Sodium: 139 mmol/L (ref 135–145)
Total Bilirubin: 0.5 mg/dL (ref 0.3–1.2)
Total Protein: 6.7 g/dL (ref 6.5–8.1)

## 2020-08-10 LAB — LACTATE DEHYDROGENASE: LDH: 125 U/L (ref 98–192)

## 2020-08-10 MED ORDER — SODIUM CHLORIDE 0.9 % IV SOLN
240.0000 mg | Freq: Once | INTRAVENOUS | Status: AC
  Administered 2020-08-10: 240 mg via INTRAVENOUS
  Filled 2020-08-10: qty 24

## 2020-08-10 MED ORDER — SODIUM CHLORIDE 0.9 % IV SOLN
Freq: Once | INTRAVENOUS | Status: AC
  Filled 2020-08-10: qty 250

## 2020-08-10 MED ORDER — HEPARIN SOD (PORK) LOCK FLUSH 100 UNIT/ML IV SOLN
INTRAVENOUS | Status: AC
  Filled 2020-08-10: qty 5

## 2020-08-10 MED ORDER — HEPARIN SOD (PORK) LOCK FLUSH 100 UNIT/ML IV SOLN
500.0000 [IU] | Freq: Once | INTRAVENOUS | Status: AC | PRN
  Administered 2020-08-10: 500 [IU]
  Filled 2020-08-10: qty 5

## 2020-08-10 NOTE — Progress Notes (Signed)
Patient here for follow up. No new concerns voiced.  °

## 2020-08-10 NOTE — Patient Instructions (Signed)
Nivolumab injection What is this medicine? NIVOLUMAB (nye VOL ue mab) is a monoclonal antibody. It treats certain types of cancer. Some of the cancers treated are colon cancer, head and neck cancer, Hodgkin lymphoma, lung cancer, and melanoma. This medicine may be used for other purposes; ask your health care provider or pharmacist if you have questions. COMMON BRAND NAME(S): Opdivo What should I tell my health care provider before I take this medicine? They need to know if you have any of these conditions:  autoimmune diseases like Crohn's disease, ulcerative colitis, or lupus  have had or planning to have an allogeneic stem cell transplant (uses someone else's stem cells)  history of chest radiation  history of organ transplant  nervous system problems like myasthenia gravis or Guillain-Barre syndrome  an unusual or allergic reaction to nivolumab, other medicines, foods, dyes, or preservatives  pregnant or trying to get pregnant  breast-feeding How should I use this medicine? This medicine is for infusion into a vein. It is given by a health care professional in a hospital or clinic setting. A special MedGuide will be given to you before each treatment. Be sure to read this information carefully each time. Talk to your pediatrician regarding the use of this medicine in children. While this drug may be prescribed for children as young as 12 years for selected conditions, precautions do apply. Overdosage: If you think you have taken too much of this medicine contact a poison control center or emergency room at once. NOTE: This medicine is only for you. Do not share this medicine with others. What if I miss a dose? It is important not to miss your dose. Call your doctor or health care professional if you are unable to keep an appointment. What may interact with this medicine? Interactions have not been studied. This list may not describe all possible interactions. Give your health  care provider a list of all the medicines, herbs, non-prescription drugs, or dietary supplements you use. Also tell them if you smoke, drink alcohol, or use illegal drugs. Some items may interact with your medicine. What should I watch for while using this medicine? This drug may make you feel generally unwell. Continue your course of treatment even though you feel ill unless your doctor tells you to stop. You may need blood work done while you are taking this medicine. Do not become pregnant while taking this medicine or for 5 months after stopping it. Women should inform their doctor if they wish to become pregnant or think they might be pregnant. There is a potential for serious side effects to an unborn child. Talk to your health care professional or pharmacist for more information. Do not breast-feed an infant while taking this medicine or for 5 months after stopping it. What side effects may I notice from receiving this medicine? Side effects that you should report to your doctor or health care professional as soon as possible:  allergic reactions like skin rash, itching or hives, swelling of the face, lips, or tongue  breathing problems  blood in the urine  bloody or watery diarrhea or black, tarry stools  changes in emotions or moods  changes in vision  chest pain  cough  dizziness  feeling faint or lightheaded, falls  fever, chills  headache with fever, neck stiffness, confusion, loss of memory, sensitivity to light, hallucination, loss of contact with reality, or seizures  joint pain  mouth sores  redness, blistering, peeling or loosening of the skin, including inside the   mouth  severe muscle pain or weakness  signs and symptoms of high blood sugar such as dizziness; dry mouth; dry skin; fruity breath; nausea; stomach pain; increased hunger or thirst; increased urination  signs and symptoms of kidney injury like trouble passing urine or change in the amount of  urine  signs and symptoms of liver injury like dark yellow or brown urine; general ill feeling or flu-like symptoms; light-colored stools; loss of appetite; nausea; right upper belly pain; unusually weak or tired; yellowing of the eyes or skin  swelling of the ankles, feet, hands  trouble passing urine or change in the amount of urine  unusually weak or tired  weight gain or loss Side effects that usually do not require medical attention (report to your doctor or health care professional if they continue or are bothersome):  bone pain  constipation  decreased appetite  diarrhea  muscle pain  nausea, vomiting  tiredness This list may not describe all possible side effects. Call your doctor for medical advice about side effects. You may report side effects to FDA at 1-800-FDA-1088. Where should I keep my medicine? This drug is given in a hospital or clinic and will not be stored at home. NOTE: This sheet is a summary. It may not cover all possible information. If you have questions about this medicine, talk to your doctor, pharmacist, or health care provider.  2021 Elsevier/Gold Standard (2019-07-16 10:08:25)  

## 2020-08-10 NOTE — Progress Notes (Signed)
**Note Edward-Identified via Obfuscation** Hematology/Oncology follow up  note [facility] [phone] [phone]   Patient Care Team: [facility] as PCP - General [name], MD as Consulting Physician (Oncology)  REFERRING PROVIDER: [facility]  CHIEF COMPLAINTS/REASON FOR VISIT:  Follow up for melanoma HISTORY OF PRESENTING ILLNESS:   Edward Pitts is a  60 y.o.  male with PMH listed below was seen in consultation at the request of  [facility]  for evaluation of inguinal mass Patient presented to emergency room 3 days ago complaining about left ing uinal mass discomfort. Reports that he has really noticed the mass growing for the past 1 months. He has a history of left lower extremity melanoma in 2011, status post local excision.  Pain was increased with squatting of laxation. He was advised to take Tylenol for pain. Denies any fever, chills, night sweating.  He does feel mild nauseated. Appetite is fair.  He has lost about 10 pounds since earlier this year. 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  Left inguinal and left iliac nodes which are suspicious for involvement.  There are also 2 small nonspecific hypodense lesions within the right liver, nonspecific.  # patient underwent left groin mass resection On 04/16/2019. Resection pathology showed malignant melanoma, replacing a lymph node, with extracapsular extension, peripheral and deep margins involved.  Left inguinal contents, all 7 lymph nodes were negative for melanoma in the lymph nodes.  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  # PDL1 80% TPS  # 04/16/2019. underwent left groin mass resection   07/07/2019  Status post adjuvant radiation and finished radiation  Patient has Mediport placed to facilitate immunotherapy treatments.  06/29/2020, CT  chest abdomen pelvis showed stable postoperative appearance of the left groin.  No evidence of local recurrence.  No evidence of metastatic disease in the chest abdomen or pelvis.  Hepatic steatosis.  Stable subcentimeter fluid attenuation lesion of the lateral right lobe of the liver, likely benign cyst or hemangioma.  Coronary artery disease.  Aortic atherosclerosis   INTERVAL HISTORY Edward Pitts is a 60 y.o. male who has above history reviewed by me today presents for follow up visit for management of inguinal nodal recurrence of melanoma Problems and complaints are listed below:  Patient had hip replacement.  Hip pain has improved and is able to walk with a cane now he takes aspirin 81 mg for DVT prophylaxis left lower extremity edema, slightly improved.  He takes oxycodone occasionally for pain.  . :Review of Systems  Constitutional: Negative for appetite change, chills, fatigue, fever and unexpected weight change.  HENT:   Negative for hearing loss and voice change.   Eyes: Negative for eye problems and icterus ( ).  Respiratory: Negative for chest tightness, cough and shortness of breath.   Cardiovascular: Negative for chest pain and leg swelling.  Gastrointestinal: Negative for abdominal distention and abdominal pain.  Endocrine: Negative for hot flashes.  Genitourinary: Negative for difficulty urinating, dysuria and frequency.   Musculoskeletal: Positive for arthralgias.       Status post left hip replacement  Skin: Negative for itching and rash.       Skin hypo-pigmentation on upper extremities, no change  Neurological: Negative for light-headedness and numbness.  Hematological: Negative for adenopathy. Does not bruise/bleed easily.  Psychiatric/Behavioral: Negative for confusion.    MEDICAL HISTORY:  Past Medical History:  Diagnosis Date  . Anemia    iron treatments  . Anxiety   . Aortic atherosclerosis ([place])   . Arthritis   . 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 Cataract   . Complication of anesthesia    PONV  . Coronary artery disease   . Dizziness of unknown etiology    has led to seizures and passing out.  . Family history of adverse reaction to anesthesia    PONV mother  . GERD (gastroesophageal reflux disease)   . History of complete heart block    PPM placed  . Hyperlipidemia   . Hypertension   . LBBB (left bundle branch block)   . Lymphedema of left leg    uses thigh high compression stockings  . Melanoma ([place]) 2012   skin cancer, left thigh  . OSA on CPAP   . PONV (postoperative nausea and vomiting) 04/16/2019  . Port-A-Cath in place    RIGHT chest wall  . Presence of cardiac pacemaker    [employer]  . Seizures ([place])    still has episodes of dizziness. last event 1 month ago (march 2022) and will pass out. takes clonazepam    SURGICAL HISTORY: Past Surgical History:  Procedure Laterality Date  . CT RADIATION THERAPY GUIDE     left groin  . dental implant     permanent implant  . KNEE SURGERY Left    arthroscopy  . LEFT HEART CATH AND CORONARY ANGIOGRAPHY Left 06/29/2017   Procedure: LEFT HEART CATH AND CORONARY ANGIOGRAPHY;  Surgeon: [name], MD;  Location: [facility];  Service: Cardiovascular;  Laterality: Left;  . LYMPH NODE DISSECTION Left 04/16/2019   Procedure: Left inguinal Lymph Node Dissection;  Surgeon: [name], MD;  Location: [facility];  Service: General;  Laterality: Left;  Marland Kitchen MELANOMA EXCISION Left 04/16/2019   Procedure: MELANOMA EXCISION LEFT GROIN MASS;  Surgeon: [name], MD;  Location: [facility];  Service: General;  Laterality: Left;  Marland Kitchen MELANOMA EXCISION WITH SENTINEL LYMPH NODE BIOPSY Left 2012   Left calf   . PACEMAKER INSERTION N/A 08/26/2018   Procedure: INSERTION PACEMAKER;  Surgeon: [name], MD;  Location: [facility];  Service: Cardiovascular;  Laterality: N/A;  . PORTA CATH INSERTION N/A 08/26/2019   Procedure: PORTA CATH INSERTION;  Surgeon:  Katha Cabal, MD;  Location: [facility];  Service: Cardiovascular;  Laterality: N/A;  . SUPERFICIAL LYMPH NODE BIOPSY / EXCISION Left 2020   lymph nodes removed around left groin melanoma site  . TEMPORARY PACEMAKER N/A 08/25/2018   Procedure: TEMPORARY PACEMAKER;  Surgeon: [name], MD;  Location: [facility];  Service: Cardiovascular;  Laterality: N/A;  . TOTAL HIP ARTHROPLASTY Left 07/14/2020   Procedure: TOTAL HIP ARTHROPLASTY;  Surgeon: Dereck Leep, MD;  Location: [facility];  Service: Orthopedics;  Laterality: Left;    SOCIAL HISTORY: Social History   Socioeconomic History  . Marital status: Married    Spouse name: Vicente Males   . Number of children: 7  . Years of education: 1  . Highest education level: Not on file  Occupational History    Comment: disability  Tobacco Use  . Smoking status: Never Smoker  . Smokeless tobacco: Never Used  Vaping Use  . Vaping Use: Never used  Substance and Sexual Activity  . Alcohol use: No  . Drug use: No  . Sexual activity: Not Currently  Other Topics Concern  . Not  on file  Social History Narrative   Lives with  Wife,   Has 2 small dogs   Caffeine use: sodas (2 per day)      Out of work on disability.  Has a walk in shower. No stairs to climb   Oncology treatment ongoing. Uses port a cath for treatment.      pacemaker   Social Determinants of Health   Financial Resource Strain: Not on file  Food Insecurity: Not on file  Transportation Needs: Not on file  Physical Activity: Not on file  Stress: Not on file  Social Connections: Not on file  Intimate Partner Violence: Not on file    FAMILY HISTORY: Family History  Problem Relation Age of Onset  . Cancer Paternal Grandmother     ALLERGIES:  is allergic to ibuprofen and nsaids.  MEDICATIONS:  Current Outpatient Medications  Medication Sig Dispense Refill  . allopurinol (ZYLOPRIM) 300 MG tablet Take 300 mg by mouth daily.    . ASPIRIN 81 PO  Take 1 tablet by mouth daily.    Marland Kitchen atorvastatin (LIPITOR) 20 MG tablet Take 20 mg by mouth daily.    . clonazePAM (KLONOPIN) 0.5 MG tablet 1 tablet in the morning, 2 in the evening (Patient taking differently: Take 0.5 mg by mouth 2 (two) times daily. 1 tablet in the morning, 2 in the evening) 90 tablet 3  . hydrocortisone 2.5 % ointment Apply topically 2 (two) times daily.    Marland Kitchen ketoconazole (NIZORAL) 2 % cream Apply 1 application topically daily.    Marland Kitchen lidocaine-prilocaine (EMLA) cream Apply 1 application topically as needed. Apply small amount of cream to port site approx 1-2 hours prior to appointment. 30 g 2  . lisinopril (ZESTRIL) 20 MG tablet Take 20 mg by mouth daily.    . metoprolol succinate (TOPROL-XL) 25 MG 24 hr tablet Take 25 mg by mouth daily.     . Multiple Vitamin (MULTIVITAMIN WITH MINERALS) TABS tablet Take 1 tablet by mouth daily. Centrum Silver    . omeprazole (PRILOSEC) 20 MG capsule TAKE 1 CAPSULE BY MOUTH EVERY DAY 30 capsule 1  . ondansetron (ZOFRAN) 4 MG tablet TAKE 1 TABLET BY MOUTH EVERY 8 HOURS AS NEEDED FOR NAUSEA AND VOMITING (Patient not taking: Reported on 08/10/2020) 90 tablet 1  . oxyCODONE (OXY IR/ROXICODONE) 5 MG immediate release tablet Take 1 tablet (5 mg total) by mouth every 4 (four) hours as needed for moderate pain (pain score 4-6). (Patient not taking: Reported on 08/10/2020) 30 tablet 0   No current facility-administered medications for this visit.     PHYSICAL EXAMINATION: ECOG PERFORMANCE STATUS: 1 - Symptomatic but completely ambulatory Vitals:   08/10/20 0856  BP: 134/67  Pulse: 73  Resp: 18  Temp: 98.2 F (36.8 C)   Filed Weights   08/10/20 0856  Weight: 229 lb 6.4 oz (104.1 kg)    Physical Exam Constitutional:      General: He is not in acute distress. HENT:     Head: Normocephalic and atraumatic.  Eyes:     General: No scleral icterus.    Pupils: Pupils are equal, round, and reactive to light.  Cardiovascular:     Rate and  Rhythm: Normal rate and regular rhythm.     Heart sounds: Normal heart sounds.  Pulmonary:     Effort: Pulmonary effort is normal. No respiratory distress.     Breath sounds: No wheezing.  Abdominal:     General: Bowel sounds are normal. There  is no distension.     Palpations: Abdomen is soft. There is no mass.     Tenderness: There is no abdominal tenderness.     Comments:  History left groin mass resection and radiation.   Musculoskeletal:        General: No deformity. Normal range of motion.     Cervical back: Normal range of motion and neck supple.     Comments: Left lower extremity edema Left hip replacement  Skin:    General: Skin is warm and dry.     Findings: No erythema or rash.  Neurological:     Mental Status: He is alert and oriented to person, place, and time. Mental status is at baseline.     Cranial Nerves: No cranial nerve deficit.     Coordination: Coordination normal.  Psychiatric:        Mood and Affect: Mood normal.    LABORATORY DATA:  I have reviewed the data as listed Lab Results  Component Value Date   WBC 4.0 08/10/2020   HGB 9.9 (L) 08/10/2020   HCT 30.6 (L) 08/10/2020   MCV 91.1 08/10/2020   PLT 229 08/10/2020   Recent Labs    11/27/19 0833 12/11/19 0854 12/25/19 0905 01/08/20 0851 07/07/20 1259 07/27/20 0839 08/10/20 0827  NA 140 139 140   < > 137 133* 139  K 4.1 3.8 4.4   < > 4.3 4.1 3.9  CL 106 104 106   < > 103 99 106  CO2 $Re'24 27 27   'FNz$ < > $R'25 23 22  'if$ GLUCOSE 125* 118* 109*   < > 98 153* 103*  BUN 20 25* 20   < > 29* 26* 21*  CREATININE 1.10 1.16 1.22   < > 1.16 1.30* 1.11  CALCIUM 9.0 9.1 9.1   < > 9.7 9.2 8.8*  GFRNONAA >60 >60 >60   < > >60 >60 >60  GFRAA >60 >60 >60  --   --   --   --   PROT 7.1 7.3 7.0   < > 7.6 7.0 6.7  ALBUMIN 4.0 4.2 4.1   < > 4.1 3.6 3.7  AST $Re'29 28 26   'Djk$ < > $R'26 28 22  'Kk$ ALT 40 39 32   < > 32 34 28  ALKPHOS 89 80 80   < > 92 72 90  BILITOT 0.6 0.8 0.9   < > 0.8 0.5 0.5   < > = values in this interval not  displayed.   Iron/TIBC/Ferritin/ %Sat    Component Value Date/Time   IRON 85 10/02/2019 0841   TIBC 342 10/02/2019 0841   FERRITIN 61 10/02/2019 0841   IRONPCTSAT 25 10/02/2019 0841      RADIOGRAPHIC STUDIES: I have personally reviewed the radiological images as listed and agreed with the findings in the report. CT CHEST ABDOMEN PELVIS W CONTRAST  Result Date: 06/30/2020 CLINICAL DATA:  Metastatic melanoma, left calf, metastatic to left inguinal lymph nodes,, status post resection and radiation therapy, ongoing immunotherapy EXAM: CT CHEST, ABDOMEN, AND PELVIS WITH CONTRAST TECHNIQUE: Multidetector CT imaging of the chest, abdomen and pelvis was performed following the standard protocol during bolus administration of intravenous contrast. CONTRAST:  11mL OMNIPAQUE IOHEXOL 300 MG/ML SOLN, additional oral enteric contrast COMPARISON:  03/31/2020 FINDINGS: CT CHEST FINDINGS Cardiovascular: Right chest port catheter. Left chest multi lead pacer. Normal heart size. Left coronary artery calcifications. No pericardial effusion. Mediastinum/Nodes: No enlarged mediastinal, hilar, or axillary lymph nodes. Thyroid gland, trachea,  and esophagus demonstrate no significant findings. Lungs/Pleura: Lungs are clear. No pleural effusion or pneumothorax. Musculoskeletal: No chest wall mass or suspicious bone lesions identified. CT ABDOMEN PELVIS FINDINGS Hepatobiliary: No solid liver abnormality is seen. Stable subcentimeter low-attenuation lesion of the peripheral right lobe of the liver (series 2, image 55). Hepatic steatosis. No gallstones, gallbladder wall thickening, or biliary dilatation. Pancreas: Unremarkable. No pancreatic ductal dilatation or surrounding inflammatory changes. Spleen: Normal in size without significant abnormality. Adrenals/Urinary Tract: Adrenal glands are unremarkable. Kidneys are normal, without renal calculi, solid lesion, or hydronephrosis. Bladder is unremarkable. Stomach/Bowel: Stomach  is within normal limits. Appendix appears normal. No evidence of bowel wall thickening, distention, or inflammatory changes. Vascular/Lymphatic: Aortic atherosclerosis. No enlarged abdominal or pelvic lymph nodes. Unchanged findings of left inguinal lymph node resection with overlying skin thickening and fat stranding in keeping with radiation therapy. Reproductive: No mass or other abnormality. Other: Fat containing umbilical hernia.  No abdominopelvic ascites. Musculoskeletal: No acute or significant osseous findings. IMPRESSION: 1. Stable postoperative appearance of the left groin. No evidence of local recurrence. 2. No evidence of metastatic disease in the chest, abdomen, or pelvis. 3. Hepatic steatosis. 4. Stable subcentimeter fluid attenuation lesion of the lateral right lobe of the liver, again almost certainly an incidental, benign cyst or hemangioma. 5. Coronary artery disease. Aortic Atherosclerosis (ICD10-I70.0). Electronically Signed   By: Eddie Candle M.D.   On: 06/30/2020 09:20   DG Hip Port Unilat With Pelvis 1V Left  Result Date: 07/14/2020 CLINICAL DATA:  Status post hip replacement. EXAM: DG HIP (WITH OR WITHOUT PELVIS) 1V PORT LEFT COMPARISON:  01/22/2020. FINDINGS: Patient status post total left hip replacement. Hardware intact. Anatomic alignment. No acute abnormality. IMPRESSION: Patient status post total left hip replacement. Hardware intact. Anatomic alignment. Electronically Signed   By: Marcello Moores  Register   On: 07/14/2020 15:08       ASSESSMENT & PLAN:  1. Malignant melanoma of overlapping sites ([place])   2. Encounter for antineoplastic immunotherapy   3. History of left hip replacement   4. Arthralgia, unspecified joint   5. Anemia, unspecified type    #Left inguinal nodal recurrence of melanoma.  Status post resection and adjuvant radiation. Stage IV Recurrent, now in NED Labs were reviewed and discussed with patient Proceed with nivolumab treatment today.   # Left hip  and knee arthritis-status post hip replacement. Pain management per orthopedic surgeon.  #Vitiligo, stable #Normocytic anemia, hemoglobin has improved to 9.9.  All questions were answered. The patient knows to call the clinic with any problems questions or concerns.  Follow-up 2 weeks We spent sufficient time to discuss many aspect of care, questions were answered to patient's satisfaction.   [name], MD, PhD Hematology Oncology [facility] Pager- [phone] 08/10/2020

## 2020-08-10 NOTE — Progress Notes (Signed)
Patient tolerated Opdivo infusion well today, no concerns voiced. Patient discharged. Stable.

## 2020-08-24 ENCOUNTER — Inpatient Hospital Stay

## 2020-08-24 ENCOUNTER — Encounter: Payer: Self-pay | Admitting: Oncology

## 2020-08-24 ENCOUNTER — Inpatient Hospital Stay (HOSPITAL_BASED_OUTPATIENT_CLINIC_OR_DEPARTMENT_OTHER): Admitting: Oncology

## 2020-08-24 VITALS — BP 125/77 | HR 85 | Temp 97.4°F | Resp 18 | Wt 229.5 lb

## 2020-08-24 DIAGNOSIS — Z5112 Encounter for antineoplastic immunotherapy: Secondary | ICD-10-CM | POA: Diagnosis not present

## 2020-08-24 DIAGNOSIS — D649 Anemia, unspecified: Secondary | ICD-10-CM

## 2020-08-24 DIAGNOSIS — C438 Malignant melanoma of overlapping sites of skin: Secondary | ICD-10-CM | POA: Diagnosis not present

## 2020-08-24 DIAGNOSIS — Z96642 Presence of left artificial hip joint: Secondary | ICD-10-CM

## 2020-08-24 LAB — CBC WITH DIFFERENTIAL/PLATELET
Abs Immature Granulocytes: 0.02 10*3/uL (ref 0.00–0.07)
Basophils Absolute: 0 10*3/uL (ref 0.0–0.1)
Basophils Relative: 1 %
Eosinophils Absolute: 0.1 10*3/uL (ref 0.0–0.5)
Eosinophils Relative: 3 %
HCT: 33.5 % — ABNORMAL LOW (ref 39.0–52.0)
Hemoglobin: 11 g/dL — ABNORMAL LOW (ref 13.0–17.0)
Immature Granulocytes: 1 %
Lymphocytes Relative: 23 %
Lymphs Abs: 1 10*3/uL (ref 0.7–4.0)
MCH: 29.3 pg (ref 26.0–34.0)
MCHC: 32.8 g/dL (ref 30.0–36.0)
MCV: 89.1 fL (ref 80.0–100.0)
Monocytes Absolute: 0.4 10*3/uL (ref 0.1–1.0)
Monocytes Relative: 9 %
Neutro Abs: 2.7 10*3/uL (ref 1.7–7.7)
Neutrophils Relative %: 63 %
Platelets: 192 10*3/uL (ref 150–400)
RBC: 3.76 MIL/uL — ABNORMAL LOW (ref 4.22–5.81)
RDW: 13.7 % (ref 11.5–15.5)
WBC: 4.2 10*3/uL (ref 4.0–10.5)
nRBC: 0 % (ref 0.0–0.2)

## 2020-08-24 LAB — COMPREHENSIVE METABOLIC PANEL
ALT: 31 U/L (ref 0–44)
AST: 24 U/L (ref 15–41)
Albumin: 4.1 g/dL (ref 3.5–5.0)
Alkaline Phosphatase: 94 U/L (ref 38–126)
Anion gap: 11 (ref 5–15)
BUN: 36 mg/dL — ABNORMAL HIGH (ref 8–23)
CO2: 23 mmol/L (ref 22–32)
Calcium: 9.2 mg/dL (ref 8.9–10.3)
Chloride: 105 mmol/L (ref 98–111)
Creatinine, Ser: 1.23 mg/dL (ref 0.61–1.24)
GFR, Estimated: >60 mL/min (ref >60)
Glucose, Bld: 138 mg/dL — ABNORMAL HIGH (ref 70–99)
Potassium: 4.1 mmol/L (ref 3.5–5.1)
Sodium: 139 mmol/L (ref 135–145)
Total Bilirubin: 0.7 mg/dL (ref 0.3–1.2)
Total Protein: 7.2 g/dL (ref 6.5–8.1)

## 2020-08-24 MED ORDER — HEPARIN SOD (PORK) LOCK FLUSH 100 UNIT/ML IV SOLN
INTRAVENOUS | Status: AC
  Filled 2020-08-24: qty 5

## 2020-08-24 MED ORDER — SODIUM CHLORIDE 0.9 % IV SOLN
Freq: Once | INTRAVENOUS | Status: AC
  Filled 2020-08-24: qty 250

## 2020-08-24 MED ORDER — SODIUM CHLORIDE 0.9% FLUSH
10.0000 mL | Freq: Once | INTRAVENOUS | Status: AC
  Administered 2020-08-24: 10 mL via INTRAVENOUS
  Filled 2020-08-24: qty 10

## 2020-08-24 MED ORDER — HEPARIN SOD (PORK) LOCK FLUSH 100 UNIT/ML IV SOLN
500.0000 [IU] | Freq: Once | INTRAVENOUS | Status: AC
  Administered 2020-08-24: 500 [IU] via INTRAVENOUS
  Filled 2020-08-24: qty 5

## 2020-08-24 MED ORDER — SODIUM CHLORIDE 0.9 % IV SOLN
240.0000 mg | Freq: Once | INTRAVENOUS | Status: AC
  Administered 2020-08-24: 240 mg via INTRAVENOUS
  Filled 2020-08-24: qty 24

## 2020-08-24 NOTE — Progress Notes (Signed)
Hematology/Oncology follow up  note [facility] [phone] [phone]   Patient Care Team: [facility] [place] as PCP - General [name], MD as Consulting Physician (Oncology)  REFERRING PROVIDER: [facility]  CHIEF COMPLAINTS/REASON FOR VISIT:  Follow up for melanoma HISTORY OF PRESENTING ILLNESS:   Edward Pitts is a  60 y.o.  male with PMH listed below was seen in consultation at the request of  [facility]  for evaluation of inguinal mass Patient presented to emergency room 3 days ago complaining about left ing uinal mass discomfort. Reports that he has really noticed the mass growing for the past 1 months. He has a history of left lower extremity melanoma in 2011, status post local excision.  Pain was increased with squatting of laxation. He was advised to take Tylenol for pain. Denies any fever, chills, night sweating.  He does feel mild nauseated. Appetite is fair.  He has lost about 10 pounds since earlier this year. 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  Left inguinal and left iliac nodes which are suspicious for involvement.  There are also 2 small nonspecific hypodense lesions within the right liver, nonspecific.  # patient underwent left groin mass resection On 04/16/2019. Resection pathology showed malignant melanoma, replacing a lymph node, with extracapsular extension, peripheral and deep margins involved.  Left inguinal contents, all 7 lymph nodes were negative for melanoma in the lymph nodes.  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  # PDL1 80% TPS  # 04/16/2019. underwent left groin mass resection   07/07/2019  Status post adjuvant radiation and finished radiation  Patient has Mediport placed to facilitate immunotherapy treatments.  06/29/2020, CT  chest abdomen pelvis showed stable postoperative appearance of the left groin.  No evidence of local recurrence.  No evidence of metastatic disease in the chest abdomen or pelvis.  Hepatic steatosis.  Stable subcentimeter fluid attenuation lesion of the lateral right lobe of the liver, likely benign cyst or hemangioma.  Coronary artery disease.  Aortic atherosclerosis   INTERVAL HISTORY Edward Pitts is a 60 y.o. male who has above history reviewed by me today presents for follow up visit for management of inguinal nodal recurrence of melanoma Problems and complaints are listed below:  Patient had hip replacement.  Left hip pain has improved. No new complaints.  Denies any shortness of breath, diarrhea, nausea vomiting.  . :Review of Systems  Constitutional: Negative for appetite change, chills, fatigue, fever and unexpected weight change.  HENT:   Negative for hearing loss and voice change.   Eyes: Negative for eye problems and icterus ( ).  Respiratory: Negative for chest tightness, cough and shortness of breath.   Cardiovascular: Negative for chest pain and leg swelling.  Gastrointestinal: Negative for abdominal distention and abdominal pain.  Endocrine: Negative for hot flashes.  Genitourinary: Negative for difficulty urinating, dysuria and frequency.   Musculoskeletal: Positive for arthralgias.       Status post left hip replacement  Skin: Negative for itching and rash.       Skin hypo-pigmentation on upper extremities, no change  Neurological: Negative for light-headedness and numbness.  Hematological: Negative for adenopathy. Does not bruise/bleed easily.  Psychiatric/Behavioral: Negative for confusion.    MEDICAL HISTORY:  Past Medical History:  Diagnosis Date  . Anemia    iron treatments  . Anxiety   .  Aortic atherosclerosis ([place])   . Arthritis   . 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 Cataract   . Complication of anesthesia    PONV  .  Coronary artery disease   . Dizziness of unknown etiology    has led to seizures and passing out.  . Family history of adverse reaction to anesthesia    PONV mother  . GERD (gastroesophageal reflux disease)   . History of complete heart block    PPM placed  . Hyperlipidemia   . Hypertension   . LBBB (left bundle branch block)   . Lymphedema of left leg    uses thigh high compression stockings  . Melanoma ([place]) 2012   skin cancer, left thigh  . OSA on CPAP   . PONV (postoperative nausea and vomiting) 04/16/2019  . Port-A-Cath in place    RIGHT chest wall  . Presence of cardiac pacemaker    [employer]tronic  . Seizures ([place])    still has episodes of dizziness. last event 1 month ago (march 2022) and will pass out. takes clonazepam    SURGICAL HISTORY: Past Surgical History:  Procedure Laterality Date  . CT RADIATION THERAPY GUIDE     left groin  . dental implant     permanent implant  . KNEE SURGERY Left    arthroscopy  . LEFT HEART CATH AND CORONARY ANGIOGRAPHY Left 06/29/2017   Procedure: LEFT HEART CATH AND CORONARY ANGIOGRAPHY;  Surgeon: [name], MD;  Location: [place] [facility];  Service: Cardiovascular;  Laterality: Left;  . LYMPH NODE DISSECTION Left 04/16/2019   Procedure: Left inguinal Lymph Node Dissection;  Surgeon: [name], MD;  Location: [place];  Service: General;  Laterality: Left;  Marland Kitchen MELANOMA EXCISION Left 04/16/2019   Procedure: MELANOMA EXCISION LEFT GROIN MASS;  Surgeon: [name], MD;  Location: [place];  Service: General;  Laterality: Left;  Marland Kitchen MELANOMA EXCISION WITH SENTINEL LYMPH NODE BIOPSY Left 2012   Left calf   . PACEMAKER INSERTION N/A 08/26/2018   Procedure: INSERTION PACEMAKER;  Surgeon: [name], MD;  Location: [facility];  Service: Cardiovascular;  Laterality: N/A;  . PORTA CATH INSERTION N/A 08/26/2019   Procedure: PORTA CATH INSERTION;  Surgeon: Katha Cabal, MD;  Location: [place] [facility];  Service: Cardiovascular;   Laterality: N/A;  . SUPERFICIAL LYMPH NODE BIOPSY / EXCISION Left 2020   lymph nodes removed around left groin melanoma site  . TEMPORARY PACEMAKER N/A 08/25/2018   Procedure: TEMPORARY PACEMAKER;  Surgeon: [name], MD;  Location: [place] [facility];  Service: Cardiovascular;  Laterality: N/A;  . TOTAL HIP ARTHROPLASTY Left 07/14/2020   Procedure: TOTAL HIP ARTHROPLASTY;  Surgeon: Dereck Leep, MD;  Location: [facility];  Service: Orthopedics;  Laterality: Left;    SOCIAL HISTORY: Social History   Socioeconomic History  . Marital status: Married    Spouse name: Vicente Males   . Number of children: 7  . Years of education: 62  . Highest education level: Not on file  Occupational History    Comment: disability  Tobacco Use  . Smoking status: Never Smoker  . Smokeless tobacco: Never Used  Vaping Use  . Vaping Use: Never used  Substance and Sexual Activity  . Alcohol use: No  . Drug use: No  . Sexual activity: Not Currently  Other Topics Concern  . Not on file  Social History Narrative   Lives with  Wife,   Has 2  small dogs   Caffeine use: sodas (2 per day)      Out of work on disability.  Has a walk in shower. No stairs to climb   Oncology treatment ongoing. Uses port a cath for treatment.      pacemaker   Social Determinants of Health   Financial Resource Strain: Not on file  Food Insecurity: Not on file  Transportation Needs: Not on file  Physical Activity: Not on file  Stress: Not on file  Social Connections: Not on file  Intimate Partner Violence: Not on file    FAMILY HISTORY: Family History  Problem Relation Age of Onset  . Cancer Paternal Grandmother     ALLERGIES:  is allergic to ibuprofen and nsaids.  MEDICATIONS:  Current Outpatient Medications  Medication Sig Dispense Refill  . allopurinol (ZYLOPRIM) 300 MG tablet Take 300 mg by mouth daily.    . ASPIRIN 81 PO Take 1 tablet by mouth daily.    Marland Kitchen atorvastatin (LIPITOR) 20 MG tablet Take 20 mg by  mouth daily.    . clonazePAM (KLONOPIN) 0.5 MG tablet 1 tablet in the morning, 2 in the evening (Patient taking differently: Take 0.5 mg by mouth 2 (two) times daily. 1 tablet in the morning, 2 in the evening) 90 tablet 3  . hydrocortisone 2.5 % ointment Apply topically 2 (two) times daily.    Marland Kitchen ketoconazole (NIZORAL) 2 % cream Apply 1 application topically daily.    Marland Kitchen lidocaine-prilocaine (EMLA) cream Apply 1 application topically as needed. Apply small amount of cream to port site approx 1-2 hours prior to appointment. 30 g 2  . lisinopril (ZESTRIL) 20 MG tablet Take 20 mg by mouth daily.    . metoprolol succinate (TOPROL-XL) 25 MG 24 hr tablet Take 25 mg by mouth daily.     . Multiple Vitamin (MULTIVITAMIN WITH MINERALS) TABS tablet Take 1 tablet by mouth daily. Centrum Silver    . omeprazole (PRILOSEC) 20 MG capsule TAKE 1 CAPSULE BY MOUTH EVERY DAY 30 capsule 1  . ondansetron (ZOFRAN) 4 MG tablet TAKE 1 TABLET BY MOUTH EVERY 8 HOURS AS NEEDED FOR NAUSEA AND VOMITING (Patient not taking: No sig reported) 90 tablet 1  . oxyCODONE (OXY IR/ROXICODONE) 5 MG immediate release tablet Take 1 tablet (5 mg total) by mouth every 4 (four) hours as needed for moderate pain (pain score 4-6). (Patient not taking: No sig reported) 30 tablet 0   No current facility-administered medications for this visit.     PHYSICAL EXAMINATION: ECOG PERFORMANCE STATUS: 1 - Symptomatic but completely ambulatory Vitals:   08/24/20 0954  BP: 125/77  Pulse: 85  Resp: 18  Temp: (!) 97.4 F (36.3 C)   Filed Weights   08/24/20 0954  Weight: 229 lb 8 oz (104.1 kg)    Physical Exam Constitutional:      General: He is not in acute distress.    Comments: Patient ambulates independently  HENT:     Head: Normocephalic and atraumatic.  Eyes:     General: No scleral icterus.    Pupils: Pupils are equal, round, and reactive to light.  Cardiovascular:     Rate and Rhythm: Normal rate and regular rhythm.     Heart  sounds: Normal heart sounds.  Pulmonary:     Effort: Pulmonary effort is normal. No respiratory distress.     Breath sounds: No wheezing.  Abdominal:     General: Bowel sounds are normal. There is no distension.  Palpations: Abdomen is soft. There is no mass.     Tenderness: There is no abdominal tenderness.     Comments:  History left groin mass resection and radiation.   Musculoskeletal:        General: No deformity. Normal range of motion.     Cervical back: Normal range of motion and neck supple.     Comments: Left lower extremity edema Left hip replacement  Skin:    General: Skin is warm and dry.     Findings: No erythema or rash.  Neurological:     Mental Status: He is alert and oriented to person, place, and time. Mental status is at baseline.     Cranial Nerves: No cranial nerve deficit.     Coordination: Coordination normal.  Psychiatric:        Mood and Affect: Mood normal.    LABORATORY DATA:  I have reviewed the data as listed Lab Results  Component Value Date   WBC 4.2 08/24/2020   HGB 11.0 (L) 08/24/2020   HCT 33.5 (L) 08/24/2020   MCV 89.1 08/24/2020   PLT 192 08/24/2020   Recent Labs    11/27/19 0833 12/11/19 0854 12/25/19 0905 01/08/20 0851 07/27/20 0839 08/10/20 0827 08/24/20 0851  NA 140 139 140   < > 133* 139 139  K 4.1 3.8 4.4   < > 4.1 3.9 4.1  CL 106 104 106   < > 99 106 105  CO2 $Re'24 27 27   'UQt$ < > $R'23 22 23  'oJ$ GLUCOSE 125* 118* 109*   < > 153* 103* 138*  BUN 20 25* 20   < > 26* 21* 36*  CREATININE 1.10 1.16 1.22   < > 1.30* 1.11 1.23  CALCIUM 9.0 9.1 9.1   < > 9.2 8.8* 9.2  GFRNONAA >60 >60 >60   < > >60 >60 >60  GFRAA >60 >60 >60  --   --   --   --   PROT 7.1 7.3 7.0   < > 7.0 6.7 7.2  ALBUMIN 4.0 4.2 4.1   < > 3.6 3.7 4.1  AST $Re'29 28 26   'mcY$ < > $R'28 22 24  'ha$ ALT 40 39 32   < > 34 28 31  ALKPHOS 89 80 80   < > 72 90 94  BILITOT 0.6 0.8 0.9   < > 0.5 0.5 0.7   < > = values in this interval not displayed.   Iron/TIBC/Ferritin/ %Sat     Component Value Date/Time   IRON 85 10/02/2019 0841   TIBC 342 10/02/2019 0841   FERRITIN 61 10/02/2019 0841   IRONPCTSAT 25 10/02/2019 0841      RADIOGRAPHIC STUDIES: I have personally reviewed the radiological images as listed and agreed with the findings in the report. CT CHEST ABDOMEN PELVIS W CONTRAST  Result Date: 06/30/2020 CLINICAL DATA:  Metastatic melanoma, left calf, metastatic to left inguinal lymph nodes,, status post resection and radiation therapy, ongoing immunotherapy EXAM: CT CHEST, ABDOMEN, AND PELVIS WITH CONTRAST TECHNIQUE: Multidetector CT imaging of the chest, abdomen and pelvis was performed following the standard protocol during bolus administration of intravenous contrast. CONTRAST:  121mL OMNIPAQUE IOHEXOL 300 MG/ML SOLN, additional oral enteric contrast COMPARISON:  03/31/2020 FINDINGS: CT CHEST FINDINGS Cardiovascular: Right chest port catheter. Left chest multi lead pacer. Normal heart size. Left coronary artery calcifications. No pericardial effusion. Mediastinum/Nodes: No enlarged mediastinal, hilar, or axillary lymph nodes. Thyroid gland, trachea, and esophagus demonstrate no significant findings. Lungs/Pleura:  Lungs are clear. No pleural effusion or pneumothorax. Musculoskeletal: No chest wall mass or suspicious bone lesions identified. CT ABDOMEN PELVIS FINDINGS Hepatobiliary: No solid liver abnormality is seen. Stable subcentimeter low-attenuation lesion of the peripheral right lobe of the liver (series 2, image 55). Hepatic steatosis. No gallstones, gallbladder wall thickening, or biliary dilatation. Pancreas: Unremarkable. No pancreatic ductal dilatation or surrounding inflammatory changes. Spleen: Normal in size without significant abnormality. Adrenals/Urinary Tract: Adrenal glands are unremarkable. Kidneys are normal, without renal calculi, solid lesion, or hydronephrosis. Bladder is unremarkable. Stomach/Bowel: Stomach is within normal limits. Appendix appears  normal. No evidence of bowel wall thickening, distention, or inflammatory changes. Vascular/Lymphatic: Aortic atherosclerosis. No enlarged abdominal or pelvic lymph nodes. Unchanged findings of left inguinal lymph node resection with overlying skin thickening and fat stranding in keeping with radiation therapy. Reproductive: No mass or other abnormality. Other: Fat containing umbilical hernia.  No abdominopelvic ascites. Musculoskeletal: No acute or significant osseous findings. IMPRESSION: 1. Stable postoperative appearance of the left groin. No evidence of local recurrence. 2. No evidence of metastatic disease in the chest, abdomen, or pelvis. 3. Hepatic steatosis. 4. Stable subcentimeter fluid attenuation lesion of the lateral right lobe of the liver, again almost certainly an incidental, benign cyst or hemangioma. 5. Coronary artery disease. Aortic Atherosclerosis (ICD10-I70.0). Electronically Signed   By: Eddie Candle M.D.   On: 06/30/2020 09:20   DG Hip Port Unilat With Pelvis 1V Left  Result Date: 07/14/2020 CLINICAL DATA:  Status post hip replacement. EXAM: DG HIP (WITH OR WITHOUT PELVIS) 1V PORT LEFT COMPARISON:  01/22/2020. FINDINGS: Patient status post total left hip replacement. Hardware intact. Anatomic alignment. No acute abnormality. IMPRESSION: Patient status post total left hip replacement. Hardware intact. Anatomic alignment. Electronically Signed   By: Marcello Moores  Register   On: 07/14/2020 15:08       ASSESSMENT & PLAN:  1. Malignant melanoma of overlapping sites ([place])   2. Encounter for antineoplastic immunotherapy   3. History of left hip replacement   4. Anemia, unspecified type    #Left inguinal nodal recurrence of melanoma.  Status post resection and adjuvant radiation. Stage IV Recurrent, now in NED Labs reviewed and discussed with patient. Proceed with maintenance Keytruda.  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 Pain management per orthopedic  surgeon.  #Vitiligo, stable #Normocytic anemia, due to recent hip surgery.  Hemoglobin has recovered to 11 which is close to his baseline.  Monitor. All questions were answered. The patient knows to call the clinic with any problems questions or concerns.  Follow-up 2 weeks We spent sufficient time to discuss many aspect of care, questions were answered to patient's satisfaction.   [name], MD, PhD Hematology Oncology [facility] at [facility] Pager- [phone] 08/24/2020

## 2020-08-24 NOTE — Progress Notes (Signed)
Pt received nivolumab infusion in clinic today. Tolerated well. No complaints at d/c. 

## 2020-08-24 NOTE — Patient Instructions (Signed)
[facility]    Discharge Instructions: Thank you for choosing [facility] to provide your oncology and hematology care.  If you have a lab appointment with the [facility], please go directly to the [facility] and check in at the registration area.  Wear comfortable clothing and clothing appropriate for easy access to any Portacath or PICC line.   We strive to give you quality time with your provider. You may need to reschedule your appointment if you arrive late (15 or more minutes).  Arriving late affects you and other patients whose appointments are after yours.  Also, if you miss three or more appointments without notifying the office, you may be dismissed from the clinic at the provider's discretion.      For prescription refill requests, have your pharmacy contact our office and allow 72 hours for refills to be completed.    Today you received the following chemotherapy and/or immunotherapy agents - Nivolumab   To help prevent nausea and vomiting after your treatment, we encourage you to take your nausea medication as directed.  BELOW ARE SYMPTOMS THAT SHOULD BE REPORTED IMMEDIATELY: . *FEVER GREATER THAN 100.4 F (38 C) OR HIGHER . *CHILLS OR SWEATING . *NAUSEA AND VOMITING THAT IS NOT CONTROLLED WITH YOUR NAUSEA MEDICATION . *UNUSUAL SHORTNESS OF BREATH . *UNUSUAL BRUISING OR BLEEDING . *URINARY PROBLEMS (pain or burning when urinating, or frequent urination) . *BOWEL PROBLEMS (unusual diarrhea, constipation, pain near the anus) . TENDERNESS IN MOUTH AND THROAT WITH OR WITHOUT PRESENCE OF ULCERS (sore throat, sores in mouth, or a toothache) . UNUSUAL RASH, SWELLING OR PAIN  . UNUSUAL VAGINAL DISCHARGE OR ITCHING   Items with * indicate a potential emergency and should be followed up as soon as possible or go to the Emergency Department if any problems should occur.  Please show the CHEMOTHERAPY ALERT CARD or IMMUNOTHERAPY  ALERT CARD at check-in to the Emergency Department and triage nurse.  Should you have questions after your visit or need to cancel or reschedule your appointment, please contact [facility]  [phone] and follow the prompts.  Office hours are 8:00 a.m. to 4:30 p.m. Monday - Friday. Please note that voicemails left after 4:00 p.m. may not be returned until the following business day.  We are closed weekends and major holidays. You have access to a nurse at all times for urgent questions. Please call the main number to the clinic [phone] and follow the prompts.  For any non-urgent questions, you may also contact your provider using MyChart. We now offer e-Visits for anyone 91 and older to request care online for non-urgent symptoms. For details visit [email or web address].   Also download the MyChart app! Go to the app store, search "MyChart", open the app, select [facility], and log in with your MyChart username and password.  Due to Covid, a mask is required upon entering the hospital/clinic. If you do not have a mask, one will be given to you upon arrival. For doctor visits, patients may have 1 support person aged 14 or older with them. For treatment visits, patients cannot have anyone with them due to current Covid guidelines and our immunocompromised population.   Nivolumab injection What is this medicine? NIVOLUMAB (nye VOL ue mab) is a monoclonal antibody. It treats certain types of cancer. Some of the cancers treated are colon cancer, head and neck cancer, Hodgkin lymphoma, lung cancer, and melanoma. This medicine may be used for  other purposes; ask your health care provider or pharmacist if you have questions. COMMON BRAND NAME(S): Opdivo What should I tell my health care provider before I take this medicine? They need to know if you have any of these conditions:  autoimmune diseases like Crohn's disease, ulcerative colitis, or lupus  have had  or planning to have an allogeneic stem cell transplant (uses someone else's stem cells)  history of chest radiation  history of organ transplant  nervous system problems like myasthenia gravis or Guillain-Barre syndrome  an unusual or allergic reaction to nivolumab, other medicines, foods, dyes, or preservatives  pregnant or trying to get pregnant  breast-feeding How should I use this medicine? This medicine is for infusion into a vein. It is given by a health care professional in a hospital or clinic setting. A special MedGuide will be given to you before each treatment. Be sure to read this information carefully each time. Talk to your pediatrician regarding the use of this medicine in children. While this drug may be prescribed for children as young as 12 years for selected conditions, precautions do apply. Overdosage: If you think you have taken too much of this medicine contact a poison control center or emergency room at once. NOTE: This medicine is only for you. Do not share this medicine with others. What if I miss a dose? It is important not to miss your dose. Call your doctor or health care professional if you are unable to keep an appointment. What may interact with this medicine? Interactions have not been studied. This list may not describe all possible interactions. Give your health care provider a list of all the medicines, herbs, non-prescription drugs, or dietary supplements you use. Also tell them if you smoke, drink alcohol, or use illegal drugs. Some items may interact with your medicine. What should I watch for while using this medicine? This drug may make you feel generally unwell. Continue your course of treatment even though you feel ill unless your doctor tells you to stop. You may need blood work done while you are taking this medicine. Do not become pregnant while taking this medicine or for 5 months after stopping it. Women should inform their doctor if they  wish to become pregnant or think they might be pregnant. There is a potential for serious side effects to an unborn child. Talk to your health care professional or pharmacist for more information. Do not breast-feed an infant while taking this medicine or for 5 months after stopping it. What side effects may I notice from receiving this medicine? Side effects that you should report to your doctor or health care professional as soon as possible:  allergic reactions like skin rash, itching or hives, swelling of the face, lips, or tongue  breathing problems  blood in the urine  bloody or watery diarrhea or black, tarry stools  changes in emotions or moods  changes in vision  chest pain  cough  dizziness  feeling faint or lightheaded, falls  fever, chills  headache with fever, neck stiffness, confusion, loss of memory, sensitivity to light, hallucination, loss of contact with reality, or seizures  joint pain  mouth sores  redness, blistering, peeling or loosening of the skin, including inside the mouth  severe muscle pain or weakness  signs and symptoms of high blood sugar such as dizziness; dry mouth; dry skin; fruity breath; nausea; stomach pain; increased hunger or thirst; increased urination  signs and symptoms of kidney injury like trouble passing urine  or change in the amount of urine  signs and symptoms of liver injury like dark yellow or brown urine; general ill feeling or flu-like symptoms; light-colored stools; loss of appetite; nausea; right upper belly pain; unusually weak or tired; yellowing of the eyes or skin  swelling of the ankles, feet, hands  trouble passing urine or change in the amount of urine  unusually weak or tired  weight gain or loss Side effects that usually do not require medical attention (report to your doctor or health care professional if they continue or are bothersome):  bone pain  constipation  decreased  appetite  diarrhea  muscle pain  nausea, vomiting  tiredness This list may not describe all possible side effects. Call your doctor for medical advice about side effects. You may report side effects to FDA at 1-800-FDA-1088. Where should I keep my medicine? This drug is given in a hospital or clinic and will not be stored at home. NOTE: This sheet is a summary. It may not cover all possible information. If you have questions about this medicine, talk to your doctor, pharmacist, or health care provider.  2021 [employer]vier/Gold Standard (2019-07-16 10:08:25)

## 2020-08-24 NOTE — Progress Notes (Signed)
Patient denies new problems/concerns today.   °

## 2020-08-28 ENCOUNTER — Other Ambulatory Visit: Payer: Self-pay | Admitting: Oncology

## 2020-08-31 ENCOUNTER — Encounter: Payer: Self-pay | Admitting: Oncology

## 2020-09-05 IMAGING — CT CT CHEST W/ CM
2 of 5 series · 12 of 36 positions shown, 15 images · IV contrast (omnipaque)
Comparison: 01/31/2019 CTA of the abdomen and pelvis. PET of
02/11/2019. No prior dedicated chest CTs.

CLINICAL DATA: Staging of malignant melanoma, status post left
groin mass resection in [REDACTED]. On immunotherapy. Loose stools.
Skin melanoma resection in 6296.

EXAM:
CT CHEST, ABDOMEN, AND PELVIS WITH CONTRAST
TECHNIQUE: Multidetector CT imaging of the chest, abdomen and pelvis was
performed following the standard protocol during bolus
administration of intravenous contrast.
CONTRAST:  100mL OMNIPAQUE IOHEXOL 300 MG/ML  SOLN

[Series 2: axials cap 5.00 · axial · 0.70mm/px · z∈[-1510,-960]mm · 9 of 136 slices shown, 12 images]
[im 13/136  mediastinal]
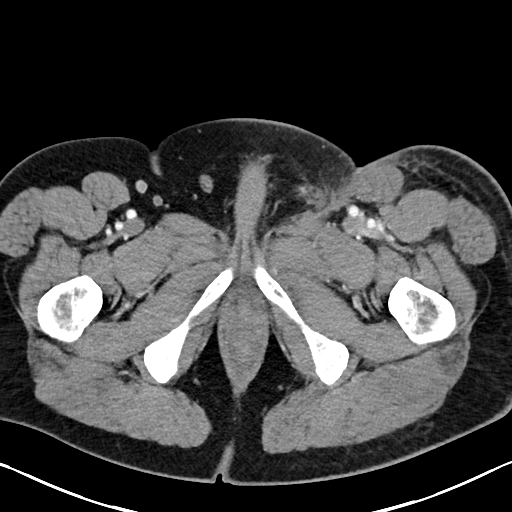
[im 13/136  lung]
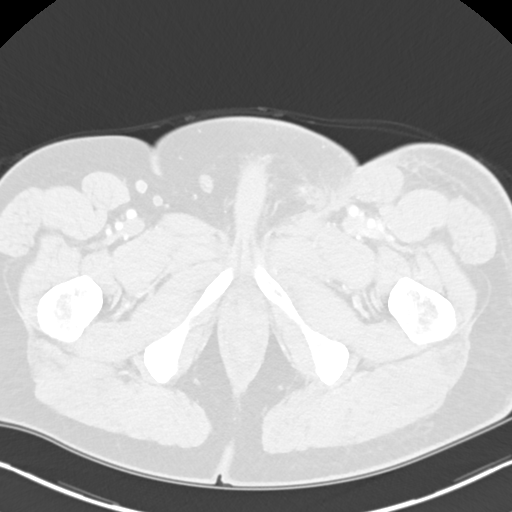
[im 25/136  lung]
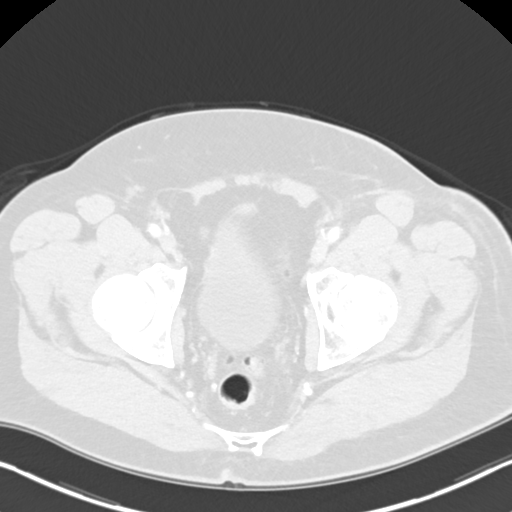
[im 37/136  lung]
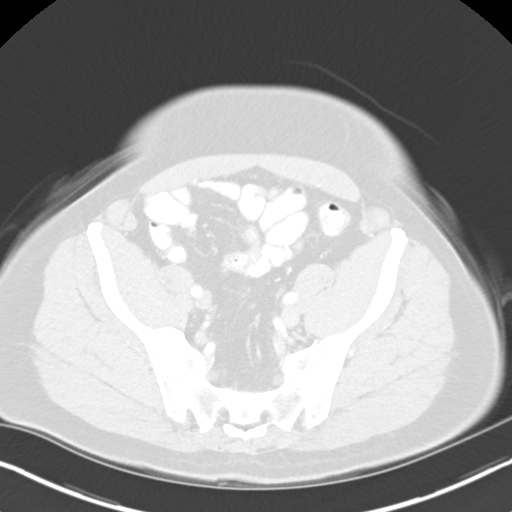
[im 50/136  lung]
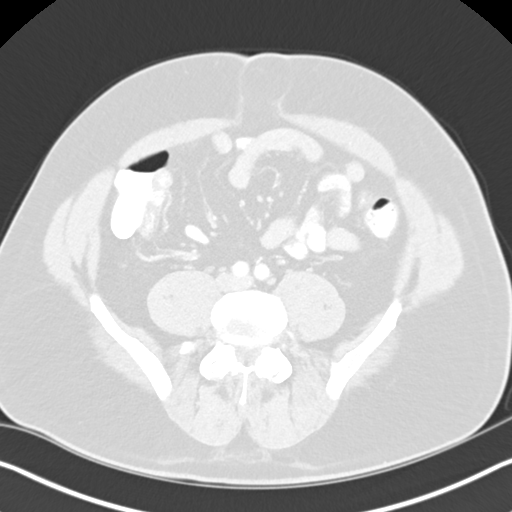
[im 74/136  mediastinal]
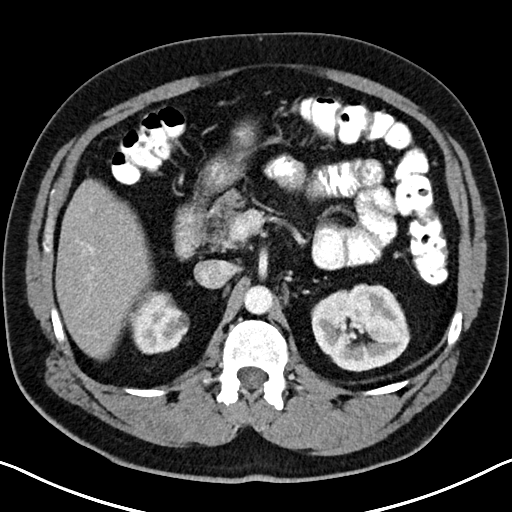
[im 74/136  lung]
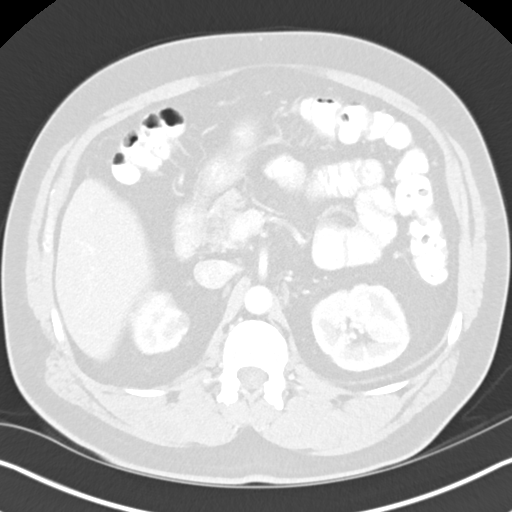
[im 86/136  lung]
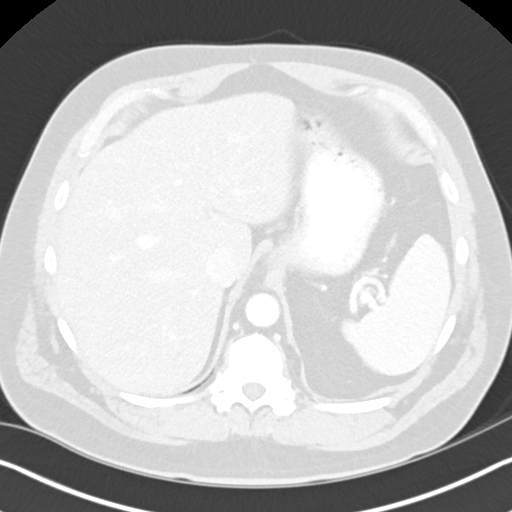
[im 99/136  lung]
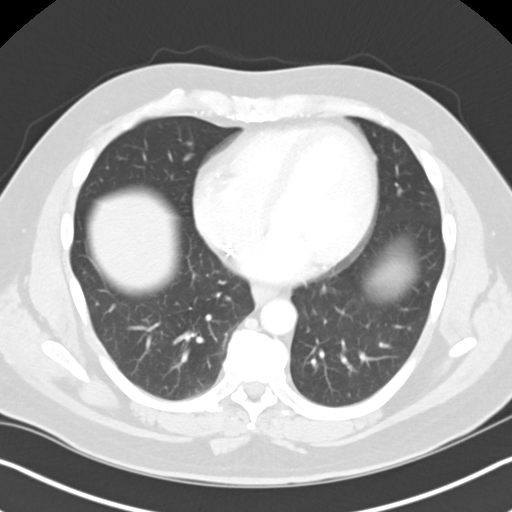
[im 111/136  lung]
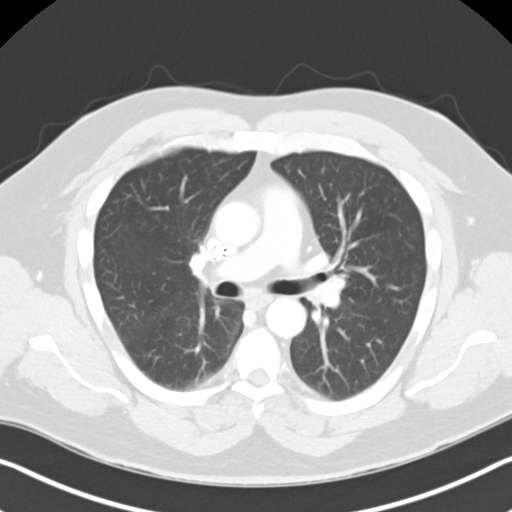
[im 123/136  mediastinal]
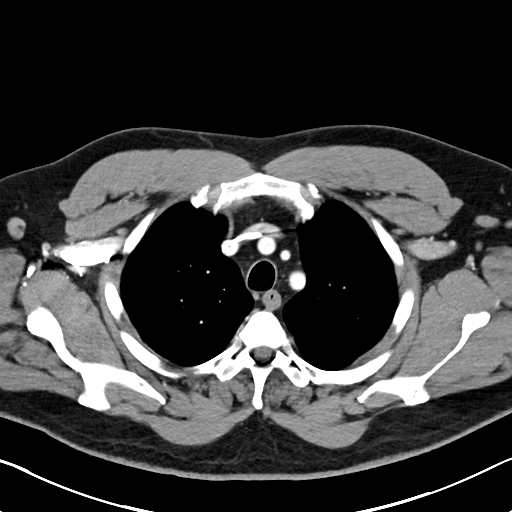
[im 123/136  lung]
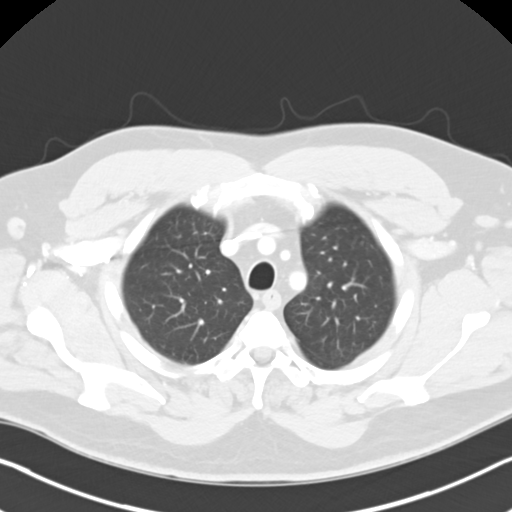

[Series 4: coronals cap 2.00 cor · coronal · 0.70mm/px · 3 of 160 slices shown]
[im 32/160  lung]
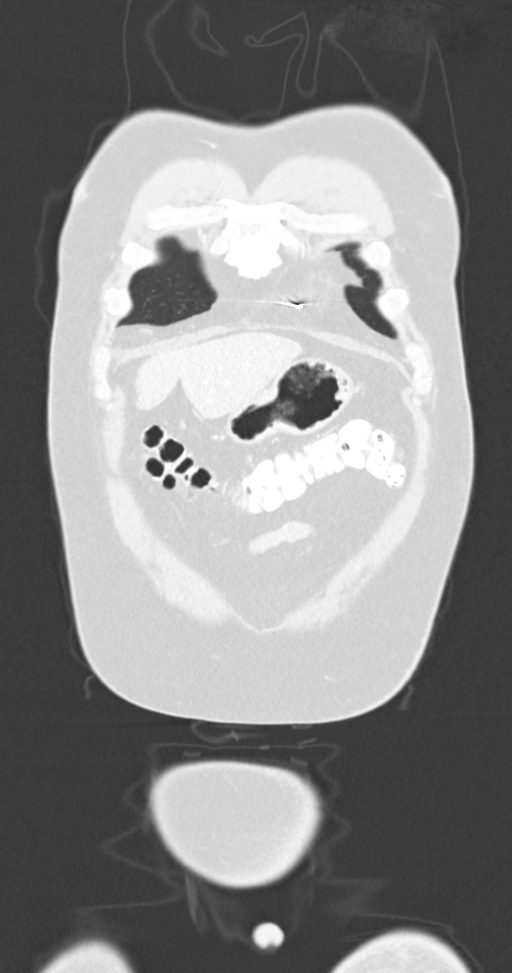
[im 64/160  lung]
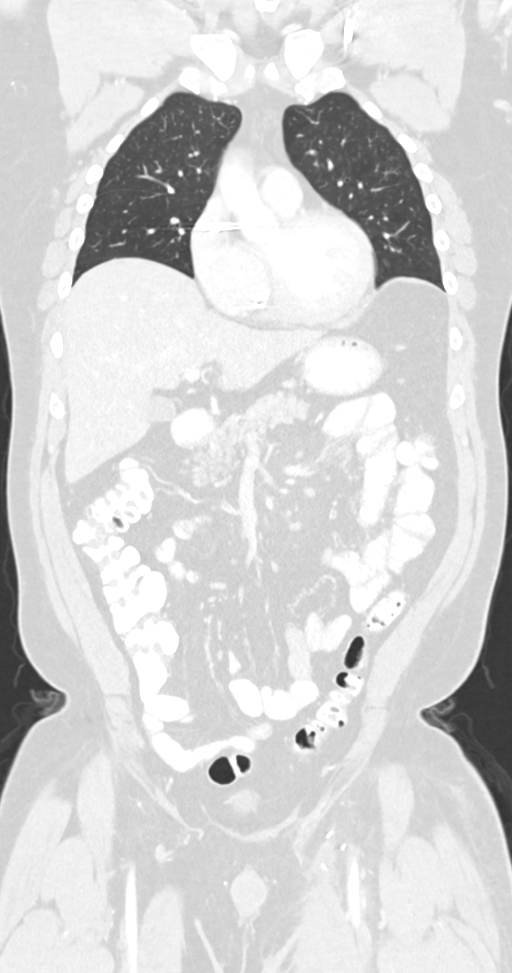
[im 96/160  lung]
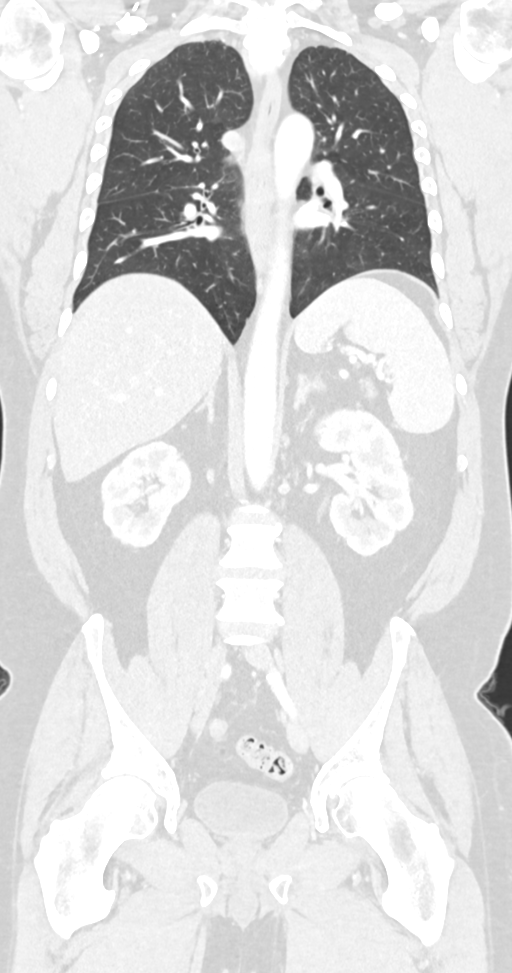

[12 of 36 positions shown; findings below may reference images not displayed]

FINDINGS: CT CHEST FINDINGS

Cardiovascular: Pacer. Aortic atherosclerosis. Normal heart size,
without pericardial effusion. No central pulmonary embolism, on this
non-dedicated study. Lad coronary artery calcification.

Mediastinum/Nodes: No supraclavicular adenopathy. 8 mm right-sided
thyroid nodule. No supraclavicular adenopathy. No mediastinal or
hilar adenopathy.

Lungs/Pleura: No pleural fluid. No suspicious pulmonary nodule or
mass.

Musculoskeletal: No acute osseous abnormality.

CT ABDOMEN PELVIS FINDINGS

Hepatobiliary: Subcentimeter right hepatic lobe cyst. There is also
a too small to characterize, 3-4 mm more inferior right hepatic lobe
lesion on 60/2. Likely present on the prior PET. Normal gallbladder,
without biliary ductal dilatation.

Pancreas: Normal, without mass or ductal dilatation.

Spleen: Normal in size, without focal abnormality.

Adrenals/Urinary Tract: Normal adrenal glands. Lower pole right
renal sinus cyst or minimally complex cyst of 1.9 cm. Upper pole
right renal subcentimeter lesion is too small to characterize. No
hydronephrosis. Normal urinary bladder.

Stomach/Bowel: Normal stomach, without wall thickening. Normal colon
and terminal ileum. Normal small bowel.

Vascular/Lymphatic: Advanced aortic and branch vessel
atherosclerosis. The previously described dominant left inguinal
mass has been resected, with subcutaneous and intramuscular edema
identified. The left external iliac nodes measure maximally 5 mm
today on 107/2 versus 8 mm on the prior PET (when remeasured). No
pelvic sidewall adenopathy.

Reproductive: Normal prostate.

Other: No significant free fluid. Moderate size fat containing
periumbilical ventral wall hernia.

Musculoskeletal: Degenerative partial fusion of the right sacroiliac
joint.
IMPRESSION: 1. Interval resection of previously described left inguinal mass.
The indeterminate left external iliac nodes on prior PET are
decreased in size.
2. No evidence of new or progressive disease.
3. Coronary artery atherosclerosis. Aortic Atherosclerosis
([id number]).
4. A too small to characterize right hepatic lobe low-density lesion
is felt to be similar to on the prior PET, not hypermetabolic on
that exam. Felt unlikely to represent metastasis but technically
indeterminate. Recommend attention on follow-up.
5. 8 mm right thyroid nodule. Not clinically significant; no
follow-up imaging recommended (ref: [HOSPITAL]. [id number]

## 2020-09-07 ENCOUNTER — Inpatient Hospital Stay (HOSPITAL_BASED_OUTPATIENT_CLINIC_OR_DEPARTMENT_OTHER): Payer: Medicaid Other | Admitting: Oncology

## 2020-09-07 ENCOUNTER — Inpatient Hospital Stay: Payer: Medicaid Other

## 2020-09-07 ENCOUNTER — Other Ambulatory Visit: Payer: Self-pay

## 2020-09-07 ENCOUNTER — Encounter: Payer: Self-pay | Admitting: Oncology

## 2020-09-07 ENCOUNTER — Inpatient Hospital Stay: Payer: Medicaid Other | Attending: Oncology

## 2020-09-07 VITALS — BP 120/73 | HR 81 | Temp 97.3°F | Resp 18 | Wt 233.9 lb

## 2020-09-07 DIAGNOSIS — C4359 Malignant melanoma of other part of trunk: Secondary | ICD-10-CM | POA: Diagnosis present

## 2020-09-07 DIAGNOSIS — D649 Anemia, unspecified: Secondary | ICD-10-CM

## 2020-09-07 DIAGNOSIS — C438 Malignant melanoma of overlapping sites of skin: Secondary | ICD-10-CM

## 2020-09-07 DIAGNOSIS — L80 Vitiligo: Secondary | ICD-10-CM | POA: Diagnosis not present

## 2020-09-07 DIAGNOSIS — Z96642 Presence of left artificial hip joint: Secondary | ICD-10-CM | POA: Diagnosis not present

## 2020-09-07 DIAGNOSIS — Z5112 Encounter for antineoplastic immunotherapy: Secondary | ICD-10-CM | POA: Insufficient documentation

## 2020-09-07 DIAGNOSIS — I10 Essential (primary) hypertension: Secondary | ICD-10-CM | POA: Diagnosis not present

## 2020-09-07 DIAGNOSIS — Z79899 Other long term (current) drug therapy: Secondary | ICD-10-CM | POA: Diagnosis not present

## 2020-09-07 LAB — COMPREHENSIVE METABOLIC PANEL WITH GFR
ALT: 36 U/L (ref 0–44)
AST: 28 U/L (ref 15–41)
Albumin: 4.1 g/dL (ref 3.5–5.0)
Alkaline Phosphatase: 94 U/L (ref 38–126)
Anion gap: 9 (ref 5–15)
BUN: 18 mg/dL (ref 8–23)
CO2: 26 mmol/L (ref 22–32)
Calcium: 9.1 mg/dL (ref 8.9–10.3)
Chloride: 104 mmol/L (ref 98–111)
Creatinine, Ser: 1.24 mg/dL (ref 0.61–1.24)
GFR, Estimated: >60 mL/min (ref >60)
Glucose, Bld: 142 mg/dL — ABNORMAL HIGH (ref 70–99)
Potassium: 3.8 mmol/L (ref 3.5–5.1)
Sodium: 139 mmol/L (ref 135–145)
Total Bilirubin: 0.7 mg/dL (ref 0.3–1.2)
Total Protein: 7.2 g/dL (ref 6.5–8.1)

## 2020-09-07 LAB — CBC WITH DIFFERENTIAL/PLATELET
Abs Immature Granulocytes: 0.03 10*3/uL (ref 0.00–0.07)
Basophils Absolute: 0 10*3/uL (ref 0.0–0.1)
Basophils Relative: 0 %
Eosinophils Absolute: 0.2 10*3/uL (ref 0.0–0.5)
Eosinophils Relative: 4 %
HCT: 34.5 % — ABNORMAL LOW (ref 39.0–52.0)
Hemoglobin: 11.5 g/dL — ABNORMAL LOW (ref 13.0–17.0)
Immature Granulocytes: 1 %
Lymphocytes Relative: 17 %
Lymphs Abs: 0.8 10*3/uL (ref 0.7–4.0)
MCH: 29.6 pg (ref 26.0–34.0)
MCHC: 33.3 g/dL (ref 30.0–36.0)
MCV: 88.9 fL (ref 80.0–100.0)
Monocytes Absolute: 0.4 10*3/uL (ref 0.1–1.0)
Monocytes Relative: 9 %
Neutro Abs: 3.4 10*3/uL (ref 1.7–7.7)
Neutrophils Relative %: 69 %
Platelets: 200 10*3/uL (ref 150–400)
RBC: 3.88 MIL/uL — ABNORMAL LOW (ref 4.22–5.81)
RDW: 13.4 % (ref 11.5–15.5)
WBC: 4.9 10*3/uL (ref 4.0–10.5)
nRBC: 0 % (ref 0.0–0.2)

## 2020-09-07 MED ORDER — HEPARIN SOD (PORK) LOCK FLUSH 100 UNIT/ML IV SOLN
500.0000 [IU] | Freq: Once | INTRAVENOUS | Status: AC | PRN
  Administered 2020-09-07: 500 [IU]
  Filled 2020-09-07: qty 5

## 2020-09-07 MED ORDER — HEPARIN SOD (PORK) LOCK FLUSH 100 UNIT/ML IV SOLN
INTRAVENOUS | Status: AC
  Filled 2020-09-07: qty 5

## 2020-09-07 MED ORDER — SODIUM CHLORIDE 0.9 % IV SOLN
240.0000 mg | Freq: Once | INTRAVENOUS | Status: AC
  Administered 2020-09-07: 240 mg via INTRAVENOUS
  Filled 2020-09-07: qty 24

## 2020-09-07 MED ORDER — SODIUM CHLORIDE 0.9 % IV SOLN
Freq: Once | INTRAVENOUS | Status: AC
  Filled 2020-09-07: qty 250

## 2020-09-07 NOTE — Progress Notes (Signed)
Hematology/Oncology follow up  note Edward Pitts Telephone:(336) [phone] Fax:(336) [phone]   Patient Care Team: [facility] [place] as PCP - General [name], MD as Consulting Physician (Oncology)  REFERRING PROVIDER: [facility]  CHIEF COMPLAINTS/REASON FOR VISIT:  Follow up for melanoma HISTORY OF PRESENTING ILLNESS:   Edward Pitts is a  60 y.o.  male with PMH listed below was seen in consultation at the request of  [facility]  for evaluation of inguinal mass Patient presented to emergency room 3 days ago complaining about left ing uinal mass discomfort. Reports that he has really noticed the mass growing for the past 1 months. He has a history of left lower extremity melanoma in 2011, status post local excision.  Pain was increased with squatting of laxation. He was advised to take Tylenol for pain. Denies any fever, chills, night sweating.  He does feel mild nauseated. Appetite is fair.  He has lost about 10 pounds since earlier this year. 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  Left inguinal and left iliac nodes which are suspicious for involvement.  There are also 2 small nonspecific hypodense lesions within the right liver, nonspecific.  # patient underwent left groin mass resection On 04/16/2019. Resection pathology showed malignant melanoma, replacing a lymph node, with extracapsular extension, peripheral and deep margins involved.  Left inguinal contents, all 7 lymph nodes were negative for melanoma in the lymph nodes.  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  # PDL1 80% TPS  # 04/16/2019. underwent left groin mass resection   07/07/2019  Status post adjuvant radiation and finished radiation  Patient has Mediport placed to facilitate immunotherapy treatments.  06/29/2020, CT  chest abdomen pelvis showed stable postoperative appearance of the left groin.  No evidence of local recurrence.  No evidence of metastatic disease in the chest abdomen or pelvis.  Hepatic steatosis.  Stable subcentimeter fluid attenuation lesion of the lateral right lobe of the liver, likely benign cyst or hemangioma.  Coronary artery disease.  Aortic atherosclerosis   INTERVAL HISTORY Edward Pitts is a 60 y.o. male who has above history reviewed by me today presents for follow up visit for management of inguinal nodal recurrence of melanoma Problems and complaints are listed below:  Patient had hip replacement.  He recovers well.  No new complaints.  Denies any shortness of breath, diarrhea, nausea vomiting.  . :Review of Systems  Constitutional:  Negative for appetite change, chills, fatigue, fever and unexpected weight change.  HENT:   Negative for hearing loss and voice change.   Eyes:  Negative for eye problems and icterus ().  Respiratory:  Negative for chest tightness, cough and shortness of breath.   Cardiovascular:  Negative for chest pain and leg swelling.  Gastrointestinal:  Negative for abdominal distention and abdominal pain.  Endocrine: Negative for hot flashes.  Genitourinary:  Negative for difficulty urinating, dysuria and frequency.   Musculoskeletal:  Positive for arthralgias.       Status post left hip replacement  Skin:  Negative for itching and rash.       Skin hypo-pigmentation on upper extremities, no change  Neurological:  Negative for light-headedness and numbness.  Hematological:  Negative for adenopathy. Does not bruise/bleed easily.  Psychiatric/Behavioral:  Negative for confusion.    MEDICAL HISTORY:  Past Medical History:  Diagnosis Date   Anemia  iron treatments   Anxiety    Aortic atherosclerosis (HCC)    Arthritis    Cancer of groin ([place]) 2021   left groin, resected, radiation   Cataract    Complication of anesthesia    PONV   Coronary  artery disease    Dizziness of unknown etiology    has led to seizures and passing out.   Family history of adverse reaction to anesthesia    PONV mother   GERD (gastroesophageal reflux disease)    History of complete heart block    PPM placed   Hyperlipidemia    Hypertension    LBBB (left bundle branch block)    Lymphedema of left leg    uses thigh high compression stockings   Melanoma ([place]) 2012   skin cancer, left thigh   OSA on CPAP    PONV (postoperative nausea and vomiting) 04/16/2019   Port-A-Cath in place    RIGHT chest wall   Presence of cardiac pacemaker    [employer]   Seizures ([place])    still has episodes of dizziness. last event 1 month ago (march 2022) and will pass out. takes clonazepam    SURGICAL HISTORY: Past Surgical History:  Procedure Laterality Date   CT RADIATION THERAPY GUIDE     left groin   dental implant     permanent implant   KNEE SURGERY Left    arthroscopy   LEFT HEART CATH AND CORONARY ANGIOGRAPHY Left 06/29/2017   Procedure: LEFT HEART CATH AND CORONARY ANGIOGRAPHY;  Surgeon: [name], MD;  Location: [facility];  Service: Cardiovascular;  Laterality: Left;   LYMPH NODE DISSECTION Left 04/16/2019   Procedure: Left inguinal Lymph Node Dissection;  Surgeon: [name], MD;  Location: [facility];  Service: General;  Laterality: Left;   MELANOMA EXCISION Left 04/16/2019   Procedure: MELANOMA EXCISION LEFT GROIN MASS;  Surgeon: [name], MD;  Location: [facility];  Service: General;  Laterality: Left;   MELANOMA EXCISION WITH SENTINEL LYMPH NODE BIOPSY Left 2012   Left calf    PACEMAKER INSERTION N/A 08/26/2018   Procedure: INSERTION PACEMAKER;  Surgeon: [name], MD;  Location: [facility];  Service: Cardiovascular;  Laterality: N/A;   PORTA CATH INSERTION N/A 08/26/2019   Procedure: PORTA CATH INSERTION;  Surgeon: Katha Cabal, MD;  Location: [facility];  Service: Cardiovascular;  Laterality: N/A;   SUPERFICIAL  LYMPH NODE BIOPSY / EXCISION Left 2020   lymph nodes removed around left groin melanoma site   TEMPORARY PACEMAKER N/A 08/25/2018   Procedure: TEMPORARY PACEMAKER;  Surgeon: [name], MD;  Location: [facility];  Service: Cardiovascular;  Laterality: N/A;   TOTAL HIP ARTHROPLASTY Left 07/14/2020   Procedure: TOTAL HIP ARTHROPLASTY;  Surgeon: Dereck Leep, MD;  Location: [facility];  Service: Orthopedics;  Laterality: Left;    SOCIAL HISTORY: Social History   Socioeconomic History   Marital status: Married    Spouse name: Vicente Males    Number of children: 7   Years of education: 12   Highest education level: Not on file  Occupational History    Comment: disability  Tobacco Use   Smoking status: Never   Smokeless tobacco: Never  Vaping Use   Vaping Use: Never used  Substance and Sexual Activity   Alcohol use: No   Drug use: No   Sexual activity: Not Currently  Other Topics Concern   Not on file  Social History Narrative   Lives with  Wife,   Has 2 small dogs   Caffeine use: sodas (2 per day)      Out of work on disability.  Has a walk in shower. No stairs to climb   Oncology treatment ongoing. Uses port a cath for treatment.      pacemaker   Social Determinants of Health   Financial Resource Strain: Not on file  Food Insecurity: Not on file  Transportation Needs: Not on file  Physical Activity: Not on file  Stress: Not on file  Social Connections: Not on file  Intimate Partner Violence: Not on file    FAMILY HISTORY: Family History  Problem Relation Age of Onset   Cancer Paternal Grandmother     ALLERGIES:  is allergic to ibuprofen and nsaids.  MEDICATIONS:  Current Outpatient Medications  Medication Sig Dispense Refill   allopurinol (ZYLOPRIM) 300 MG tablet Take 300 mg by mouth daily.     ASPIRIN 81 PO Take 1 tablet by mouth daily.     atorvastatin (LIPITOR) 20 MG tablet Take 20 mg by mouth daily.     clonazePAM (KLONOPIN) 0.5 MG tablet 1  tablet in the morning, 2 in the evening (Patient taking differently: Take 0.5 mg by mouth 2 (two) times daily. 1 tablet in the morning, 2 in the evening) 90 tablet 3   hydrocortisone 2.5 % ointment Apply topically 2 (two) times daily.     ketoconazole (NIZORAL) 2 % cream Apply 1 application topically daily.     lidocaine-prilocaine (EMLA) cream Apply 1 application topically as needed. Apply small amount of cream to port site approx 1-2 hours prior to appointment. 30 g 2   lisinopril (ZESTRIL) 20 MG tablet Take 20 mg by mouth daily.     metoprolol succinate (TOPROL-XL) 25 MG 24 hr tablet Take 25 mg by mouth daily.      Multiple Vitamin (MULTIVITAMIN WITH MINERALS) TABS tablet Take 1 tablet by mouth daily. Centrum Silver     omeprazole (PRILOSEC) 20 MG capsule TAKE 1 CAPSULE BY MOUTH EVERY DAY 30 capsule 1   ondansetron (ZOFRAN) 4 MG tablet TAKE 1 TABLET BY MOUTH EVERY 8 HOURS AS NEEDED FOR NAUSEA AND VOMITING (Patient not taking: No sig reported) 90 tablet 1   oxyCODONE (OXY IR/ROXICODONE) 5 MG immediate release tablet Take 1 tablet (5 mg total) by mouth every 4 (four) hours as needed for moderate pain (pain score 4-6). (Patient not taking: No sig reported) 30 tablet 0   No current facility-administered medications for this visit.     PHYSICAL EXAMINATION: ECOG PERFORMANCE STATUS: 1 - Symptomatic but completely ambulatory There were no vitals filed for this visit.  There were no vitals filed for this visit.   Physical Exam Constitutional:      General: He is not in acute distress.    Comments: Patient ambulates independently  HENT:     Head: Normocephalic and atraumatic.  Eyes:     General: No scleral icterus.    Pupils: Pupils are equal, round, and reactive to light.  Cardiovascular:     Rate and Rhythm: Normal rate and regular rhythm.     Heart sounds: Normal heart sounds.  Pulmonary:     Effort: Pulmonary effort is normal. No respiratory distress.     Breath sounds: No wheezing.   Abdominal:     General: Bowel sounds are normal. There is no distension.     Palpations: Abdomen is soft. There is no mass.     Tenderness: There is no  abdominal tenderness.     Comments:  History left groin mass resection and radiation.   Musculoskeletal:        General: No deformity. Normal range of motion.     Cervical back: Normal range of motion and neck supple.     Comments: Left lower extremity edema Left hip replacement  Skin:    General: Skin is warm and dry.     Findings: No erythema or rash.  Neurological:     Mental Status: He is alert and oriented to person, place, and time. Mental status is at baseline.     Cranial Nerves: No cranial nerve deficit.     Coordination: Coordination normal.  Psychiatric:        Mood and Affect: Mood normal.   LABORATORY DATA:  I have reviewed the data as listed Lab Results  Component Value Date   WBC 4.2 08/24/2020   HGB 11.0 (L) 08/24/2020   HCT 33.5 (L) 08/24/2020   MCV 89.1 08/24/2020   PLT 192 08/24/2020   Recent Labs    11/27/19 0833 12/11/19 0854 12/25/19 0905 01/08/20 0851 07/27/20 0839 08/10/20 0827 08/24/20 0851  NA 140 139 140   < > 133* 139 139  K 4.1 3.8 4.4   < > 4.1 3.9 4.1  CL 106 104 106   < > 99 106 105  CO2 _0 < > _1 GLUCOSE 125* 118* 109*   < > 153* 103* 138*  BUN 20 25* 20   < > 26* 21* 36*  CREATININE 1.10 1.16 1.22   < > 1.30* 1.11 1.23  CALCIUM 9.0 9.1 9.1   < > 9.2 8.8* 9.2  GFRNONAA >60 >60 >60   < > >60 >60 >60  GFRAA >60 >60 >60  --   --   --   --   PROT 7.1 7.3 7.0   < > 7.0 6.7 7.2  ALBUMIN 4.0 4.2 4.1   < > 3.6 3.7 4.1  AST _2 < > _3 ALT 40 39 32   < > 34 28 31  ALKPHOS 89 80 80   < > 72 90 94  BILITOT 0.6 0.8 0.9   < > 0.5 0.5 0.7   < > = values in this interval not displayed.    Iron/TIBC/Ferritin/ %Sat    Component Value Date/Time   IRON 85 10/02/2019 0841   TIBC 342 10/02/2019 0841   FERRITIN 61 10/02/2019 0841   IRONPCTSAT 25 10/02/2019 0841       RADIOGRAPHIC STUDIES: I have personally reviewed the radiological images as listed and agreed with the findings in the report. CT CHEST ABDOMEN PELVIS W CONTRAST  Result Date: 06/30/2020 CLINICAL DATA:  Metastatic melanoma, left calf, metastatic to left inguinal lymph nodes,, status post resection and radiation therapy, ongoing immunotherapy EXAM: CT CHEST, ABDOMEN, AND PELVIS WITH CONTRAST TECHNIQUE: Multidetector CT imaging of the chest, abdomen and pelvis was performed following the standard protocol during bolus administration of intravenous contrast. CONTRAST:  174m OMNIPAQUE IOHEXOL 300 MG/ML SOLN, additional oral enteric contrast COMPARISON:  03/31/2020 FINDINGS: CT CHEST FINDINGS Cardiovascular: Right chest port catheter. Left chest multi lead pacer. Normal heart size. Left coronary artery calcifications. No pericardial effusion. Mediastinum/Nodes: No enlarged mediastinal, hilar, or axillary lymph nodes. Thyroid gland, trachea, and esophagus demonstrate no significant findings. Lungs/Pleura: Lungs are clear. No pleural effusion or pneumothorax. Musculoskeletal: No chest wall mass or suspicious bone  lesions identified. CT ABDOMEN PELVIS FINDINGS Hepatobiliary: No solid liver abnormality is seen. Stable subcentimeter low-attenuation lesion of the peripheral right lobe of the liver (series 2, image 55). Hepatic steatosis. No gallstones, gallbladder wall thickening, or biliary dilatation. Pancreas: Unremarkable. No pancreatic ductal dilatation or surrounding inflammatory changes. Spleen: Normal in size without significant abnormality. Adrenals/Urinary Tract: Adrenal glands are unremarkable. Kidneys are normal, without renal calculi, solid lesion, or hydronephrosis. Bladder is unremarkable. Stomach/Bowel: Stomach is within normal limits. Appendix appears normal. No evidence of bowel wall thickening, distention, or inflammatory changes. Vascular/Lymphatic: Aortic atherosclerosis. No enlarged abdominal  or pelvic lymph nodes. Unchanged findings of left inguinal lymph node resection with overlying skin thickening and fat stranding in keeping with radiation therapy. Reproductive: No mass or other abnormality. Other: Fat containing umbilical hernia.  No abdominopelvic ascites. Musculoskeletal: No acute or significant osseous findings. IMPRESSION: 1. Stable postoperative appearance of the left groin. No evidence of local recurrence. 2. No evidence of metastatic disease in the chest, abdomen, or pelvis. 3. Hepatic steatosis. 4. Stable subcentimeter fluid attenuation lesion of the lateral right lobe of the liver, again almost certainly an incidental, benign cyst or hemangioma. 5. Coronary artery disease. Aortic Atherosclerosis (ICD10-I70.0). Electronically Signed   By: Eddie Candle M.D.   On: 06/30/2020 09:20   DG Hip Port Unilat With Pelvis 1V Left  Result Date: 07/14/2020 CLINICAL DATA:  Status post hip replacement. EXAM: DG HIP (WITH OR WITHOUT PELVIS) 1V PORT LEFT COMPARISON:  01/22/2020. FINDINGS: Patient status post total left hip replacement. Hardware intact. Anatomic alignment. No acute abnormality. IMPRESSION: Patient status post total left hip replacement. Hardware intact. Anatomic alignment. Electronically Signed   By: Marcello Moores  Register   On: 07/14/2020 15:08        ASSESSMENT & PLAN:  1. Malignant melanoma of overlapping sites ([place])   2. Encounter for antineoplastic immunotherapy   3. History of left hip replacement   4. Anemia, unspecified type    #Left inguinal nodal recurrence of melanoma.  Status post resection and adjuvant radiation. Stage IV Recurrent, now in NED Labs reviewed and discussed with patient. 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 Pain management per orthopedic surgeon.  #Vitiligo, stable #Normocytic anemia, due to recent hip surgery.  Hemoglobin has recovered to 11.5 which is close to his  baseline.  Monitor. All questions were answered. The patient knows to call the clinic with any problems questions or concerns.  Follow-up 2 weeks We spent sufficient time to discuss many aspect of care, questions were answered to patient's satisfaction.   [name], MD, PhD Hematology Oncology [facility] [employer] Pager- [phone] 09/07/2020

## 2020-09-07 NOTE — Progress Notes (Signed)
Pt received nivolumab infusion in clinic today. Tolerated well. No complaints at d/c. 

## 2020-09-07 NOTE — Patient Instructions (Addendum)
[facility]    Discharge Instructions: Thank you for choosing [facility] to provide your oncology and hematology care.  If you have a lab appointment with the [facility], please go directly to the [facility] and check in at the registration area.  Wear comfortable clothing and clothing appropriate for easy access to any Portacath or PICC line.   We strive to give you quality time with your provider. You may need to reschedule your appointment if you arrive late (15 or more minutes).  Arriving late affects you and other patients whose appointments are after yours.  Also, if you miss three or more appointments without notifying the office, you may be dismissed from the clinic at the provider's discretion.      For prescription refill requests, have your pharmacy contact our office and allow 72 hours for refills to be completed.    Today you received the following chemotherapy and/or immunotherapy agents - Nivolumab      To help prevent nausea and vomiting after your treatment, we encourage you to take your nausea medication as directed.  BELOW ARE SYMPTOMS THAT SHOULD BE REPORTED IMMEDIATELY: *FEVER GREATER THAN 100.4 F (38 C) OR HIGHER *CHILLS OR SWEATING *NAUSEA AND VOMITING THAT IS NOT CONTROLLED WITH YOUR NAUSEA MEDICATION *UNUSUAL SHORTNESS OF BREATH *UNUSUAL BRUISING OR BLEEDING *URINARY PROBLEMS (pain or burning when urinating, or frequent urination) *BOWEL PROBLEMS (unusual diarrhea, constipation, pain near the anus) TENDERNESS IN MOUTH AND THROAT WITH OR WITHOUT PRESENCE OF ULCERS (sore throat, sores in mouth, or a toothache) UNUSUAL RASH, SWELLING OR PAIN  UNUSUAL VAGINAL DISCHARGE OR ITCHING   Items with * indicate a potential emergency and should be followed up as soon as possible or go to the Emergency Department if any problems should occur.  Please show the CHEMOTHERAPY ALERT CARD or IMMUNOTHERAPY ALERT CARD at  check-in to the Emergency Department and triage nurse.  Should you have questions after your visit or need to cancel or reschedule your appointment, please contact [facility]  [phone] and follow the prompts.  Office hours are 8:00 a.m. to 4:30 p.m. Monday - Friday. Please note that voicemails left after 4:00 p.m. may not be returned until the following business day.  We are closed weekends and major holidays. You have access to a nurse at all times for urgent questions. Please call the main number to the clinic [phone] and follow the prompts.  For any non-urgent questions, you may also contact your provider using MyChart. We now offer e-Visits for anyone 47 and older to request care online for non-urgent symptoms. For details visit [email or web address].   Also download the MyChart app! Go to the app store, search "MyChart", open the app, select [facility], and log in with your MyChart username and password.  Due to Covid, a mask is required upon entering the hospital/clinic. If you do not have a mask, one will be given to you upon arrival. For doctor visits, patients may have 1 support person aged 54 or older with them. For treatment visits, patients cannot have anyone with them due to current Covid guidelines and our immunocompromised population.   Nivolumab injection What is this medication? NIVOLUMAB (nye VOL ue mab) is a monoclonal antibody. It treats certain types of cancer. Some of the cancers treated are colon cancer, head and neck cancer,Hodgkin lymphoma, lung cancer, and melanoma. This medicine may be used for other purposes; ask your health care provider orpharmacist  if you have questions. COMMON BRAND NAME(S): Opdivo What should I tell my care team before I take this medication? They need to know if you have any of these conditions: autoimmune diseases like Crohn's disease, ulcerative colitis, or lupus have had or planning to have an  allogeneic stem cell transplant (uses someone else's stem cells) history of chest radiation history of organ transplant nervous system problems like myasthenia gravis or Guillain-Barre syndrome an unusual or allergic reaction to nivolumab, other medicines, foods, dyes, or preservatives pregnant or trying to get pregnant breast-feeding How should I use this medication? This medicine is for infusion into a vein. It is given by a health careprofessional in a hospital or clinic setting. A special MedGuide will be given to you before each treatment. Be sure to readthis information carefully each time. Talk to your pediatrician regarding the use of this medicine in children. While this drug may be prescribed for children as young as 12 years for selectedconditions, precautions do apply. Overdosage: If you think you have taken too much of this medicine contact apoison control center or emergency room at once. NOTE: This medicine is only for you. Do not share this medicine with others. What if I miss a dose? It is important not to miss your dose. Call your doctor or health careprofessional if you are unable to keep an appointment. What may interact with this medication? Interactions have not been studied. This list may not describe all possible interactions. Give your health care provider a list of all the medicines, herbs, non-prescription drugs, or dietary supplements you use. Also tell them if you smoke, drink alcohol, or use illegaldrugs. Some items may interact with your medicine. What should I watch for while using this medication? This drug may make you feel generally unwell. Continue your course of treatmenteven though you feel ill unless your doctor tells you to stop. You may need blood work done while you are taking this medicine. Do not become pregnant while taking this medicine or for 5 months after stopping it. Women should inform their doctor if they wish to become pregnant or think they  might be pregnant. There is a potential for serious side effects to an unborn child. Talk to your health care professional or pharmacist for more information. Do not breast-feed an infant while taking this medicine orfor 5 months after stopping it. What side effects may I notice from receiving this medication? Side effects that you should report to your doctor or health care professionalas soon as possible: allergic reactions like skin rash, itching or hives, swelling of the face, lips, or tongue breathing problems blood in the urine bloody or watery diarrhea or black, tarry stools changes in emotions or moods changes in vision chest pain cough dizziness feeling faint or lightheaded, falls fever, chills headache with fever, neck stiffness, confusion, loss of memory, sensitivity to light, hallucination, loss of contact with reality, or seizures joint pain mouth sores redness, blistering, peeling or loosening of the skin, including inside the mouth severe muscle pain or weakness signs and symptoms of high blood sugar such as dizziness; dry mouth; dry skin; fruity breath; nausea; stomach pain; increased hunger or thirst; increased urination signs and symptoms of kidney injury like trouble passing urine or change in the amount of urine signs and symptoms of liver injury like dark yellow or brown urine; general ill feeling or flu-like symptoms; light-colored stools; loss of appetite; nausea; right upper belly pain; unusually weak or tired; yellowing of the eyes or skin  swelling of the ankles, feet, hands trouble passing urine or change in the amount of urine unusually weak or tired weight gain or loss Side effects that usually do not require medical attention (report to yourdoctor or health care professional if they continue or are bothersome): bone pain constipation decreased appetite diarrhea muscle pain nausea, vomiting tiredness This list may not describe all possible side effects.  Call your doctor for medical advice about side effects. You may report side effects to FDA at1-800-FDA-1088. Where should I keep my medication? This drug is given in a hospital or clinic and will not be stored at home. NOTE: This sheet is a summary. It may not cover all possible information. If you have questions about this medicine, talk to your doctor, pharmacist, orhealth care provider.  2022 Elsevier/Gold Standard (2019-07-16 10:08:25)

## 2020-09-07 NOTE — Progress Notes (Signed)
Pt here for follow up. No new concerns voiced.   

## 2020-09-21 ENCOUNTER — Inpatient Hospital Stay (HOSPITAL_BASED_OUTPATIENT_CLINIC_OR_DEPARTMENT_OTHER): Payer: Medicaid Other | Admitting: Oncology

## 2020-09-21 ENCOUNTER — Other Ambulatory Visit: Payer: Self-pay

## 2020-09-21 ENCOUNTER — Encounter: Payer: Self-pay | Admitting: Oncology

## 2020-09-21 ENCOUNTER — Inpatient Hospital Stay: Payer: Medicaid Other

## 2020-09-21 VITALS — BP 129/79 | HR 85 | Temp 97.8°F | Resp 18 | Wt 233.8 lb

## 2020-09-21 DIAGNOSIS — Z5112 Encounter for antineoplastic immunotherapy: Secondary | ICD-10-CM

## 2020-09-21 DIAGNOSIS — C438 Malignant melanoma of overlapping sites of skin: Secondary | ICD-10-CM

## 2020-09-21 DIAGNOSIS — D649 Anemia, unspecified: Secondary | ICD-10-CM

## 2020-09-21 DIAGNOSIS — L80 Vitiligo: Secondary | ICD-10-CM | POA: Diagnosis not present

## 2020-09-21 LAB — TSH: TSH: 2.541 u[IU]/mL (ref 0.350–4.500)

## 2020-09-21 LAB — CBC WITH DIFFERENTIAL/PLATELET
Abs Immature Granulocytes: 0.03 10*3/uL (ref 0.00–0.07)
Basophils Absolute: 0 10*3/uL (ref 0.0–0.1)
Basophils Relative: 0 %
Eosinophils Absolute: 0.2 10*3/uL (ref 0.0–0.5)
Eosinophils Relative: 4 %
HCT: 34.5 % — ABNORMAL LOW (ref 39.0–52.0)
Hemoglobin: 11.3 g/dL — ABNORMAL LOW (ref 13.0–17.0)
Immature Granulocytes: 1 %
Lymphocytes Relative: 21 %
Lymphs Abs: 1 10*3/uL (ref 0.7–4.0)
MCH: 29.1 pg (ref 26.0–34.0)
MCHC: 32.8 g/dL (ref 30.0–36.0)
MCV: 88.9 fL (ref 80.0–100.0)
Monocytes Absolute: 0.4 10*3/uL (ref 0.1–1.0)
Monocytes Relative: 9 %
Neutro Abs: 3.1 10*3/uL (ref 1.7–7.7)
Neutrophils Relative %: 65 %
Platelets: 181 10*3/uL (ref 150–400)
RBC: 3.88 MIL/uL — ABNORMAL LOW (ref 4.22–5.81)
RDW: 13.3 % (ref 11.5–15.5)
WBC: 4.7 10*3/uL (ref 4.0–10.5)
nRBC: 0 % (ref 0.0–0.2)

## 2020-09-21 LAB — COMPREHENSIVE METABOLIC PANEL
ALT: 30 U/L (ref 0–44)
AST: 24 U/L (ref 15–41)
Albumin: 4 g/dL (ref 3.5–5.0)
Alkaline Phosphatase: 89 U/L (ref 38–126)
Anion gap: 8 (ref 5–15)
BUN: 31 mg/dL — ABNORMAL HIGH (ref 8–23)
CO2: 24 mmol/L (ref 22–32)
Calcium: 9 mg/dL (ref 8.9–10.3)
Chloride: 105 mmol/L (ref 98–111)
Creatinine, Ser: 1.14 mg/dL (ref 0.61–1.24)
GFR, Estimated: >60 mL/min (ref >60)
Glucose, Bld: 132 mg/dL — ABNORMAL HIGH (ref 70–99)
Potassium: 4.3 mmol/L (ref 3.5–5.1)
Sodium: 137 mmol/L (ref 135–145)
Total Bilirubin: 0.5 mg/dL (ref 0.3–1.2)
Total Protein: 6.9 g/dL (ref 6.5–8.1)

## 2020-09-21 LAB — T4, FREE: Free T4: 0.82 ng/dL (ref 0.61–1.12)

## 2020-09-21 LAB — LACTATE DEHYDROGENASE: LDH: 124 U/L (ref 98–192)

## 2020-09-21 MED ORDER — SODIUM CHLORIDE 0.9 % IV SOLN
240.0000 mg | Freq: Once | INTRAVENOUS | Status: AC
  Administered 2020-09-21: 240 mg via INTRAVENOUS
  Filled 2020-09-21: qty 24

## 2020-09-21 MED ORDER — HEPARIN SOD (PORK) LOCK FLUSH 100 UNIT/ML IV SOLN
500.0000 [IU] | Freq: Once | INTRAVENOUS | Status: AC | PRN
  Administered 2020-09-21: 500 [IU]
  Filled 2020-09-21: qty 5

## 2020-09-21 MED ORDER — SODIUM CHLORIDE 0.9 % IV SOLN
Freq: Once | INTRAVENOUS | Status: AC
  Filled 2020-09-21: qty 250

## 2020-09-21 MED ORDER — HEPARIN SOD (PORK) LOCK FLUSH 100 UNIT/ML IV SOLN
INTRAVENOUS | Status: AC
  Filled 2020-09-21: qty 5

## 2020-09-21 MED ORDER — SODIUM CHLORIDE 0.9% FLUSH
10.0000 mL | INTRAVENOUS | Status: DC | PRN
  Filled 2020-09-21: qty 10

## 2020-09-21 NOTE — Progress Notes (Signed)
Pt here for follow up. No new concerns voiced.   

## 2020-09-21 NOTE — Progress Notes (Signed)
Hematology/Oncology follow up  note [facility] [phone] [phone]   Patient Care Team: [facility] as PCP - General [name], MD as Consulting Physician (Oncology)  REFERRING PROVIDER: [facility]  CHIEF COMPLAINTS/REASON FOR VISIT:  Follow up for melanoma HISTORY OF PRESENTING ILLNESS:   Edward Pitts is a  60 y.o.  male with PMH listed below was seen in consultation at the request of  [facility]  for evaluation of inguinal mass Patient presented to emergency room 3 days ago complaining about left ing uinal mass discomfort. Reports that he has really noticed the mass growing for the past 1 months. He has a history of left lower extremity melanoma in 2011, status post local excision.  Pain was increased with squatting of laxation. He was advised to take Tylenol for pain. Denies any fever, chills, night sweating.  He does feel mild nauseated. Appetite is fair.  He has lost about 10 pounds since earlier this year. 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  Left inguinal and left iliac nodes which are suspicious for involvement.  There are also 2 small nonspecific hypodense lesions within the right liver, nonspecific.  # patient underwent left groin mass resection On 04/16/2019. Resection pathology showed malignant melanoma, replacing a lymph node, with extracapsular extension, peripheral and deep margins involved.  Left inguinal contents, all 7 lymph nodes were negative for melanoma in the lymph nodes.  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  # PDL1 80% TPS  # 04/16/2019. underwent left groin mass resection   07/07/2019  Status post adjuvant radiation and finished radiation  Patient has Mediport placed to facilitate immunotherapy treatments.  06/29/2020, CT  chest abdomen pelvis showed stable postoperative appearance of the left groin.  No evidence of local recurrence.  No evidence of metastatic disease in the chest abdomen or pelvis.  Hepatic steatosis.  Stable subcentimeter fluid attenuation lesion of the lateral right lobe of the liver, likely benign cyst or hemangioma.  Coronary artery disease.  Aortic atherosclerosis   INTERVAL HISTORY Salvadore Valvano is a 60 y.o. male who has above history reviewed by me today presents for follow up visit for management of inguinal nodal recurrence of melanoma Problems and complaints are listed below:  Patient had hip replacement.  Denies pain today. He recovers well.  No new complaints.  Denies any shortness of breath, diarrhea, nausea vomiting.  . :Review of Systems  Constitutional:  Negative for appetite change, chills, fatigue, fever and unexpected weight change.  HENT:   Negative for hearing loss and voice change.   Eyes:  Negative for eye problems and icterus ().  Respiratory:  Negative for chest tightness, cough and shortness of breath.   Cardiovascular:  Negative for chest pain and leg swelling.  Gastrointestinal:  Negative for abdominal distention and abdominal pain.  Endocrine: Negative for hot flashes.  Genitourinary:  Negative for difficulty urinating, dysuria and frequency.   Musculoskeletal:  Positive for arthralgias.       Status post left hip replacement  Skin:  Negative for itching and rash.       Skin hypo-pigmentation on upper extremities, no change  Neurological:  Negative for light-headedness and numbness.  Hematological:  Negative for adenopathy. Does not bruise/bleed easily.  Psychiatric/Behavioral:  Negative for confusion.    MEDICAL HISTORY:  Past Medical History:  Diagnosis Date  Anemia    iron treatments   Anxiety    Aortic atherosclerosis (HCC)    Arthritis    Cancer of groin ([place]) 2021   left groin, resected, radiation   Cataract    Complication of anesthesia     PONV   Coronary artery disease    Dizziness of unknown etiology    has led to seizures and passing out.   Family history of adverse reaction to anesthesia    PONV mother   GERD (gastroesophageal reflux disease)    History of complete heart block    PPM placed   Hyperlipidemia    Hypertension    LBBB (left bundle branch block)    Lymphedema of left leg    uses thigh high compression stockings   Melanoma ([place]) 2012   skin cancer, left thigh   OSA on CPAP    PONV (postoperative nausea and vomiting) 04/16/2019   Port-A-Cath in place    RIGHT chest wall   Presence of cardiac pacemaker    [employer]   Seizures ([place])    still has episodes of dizziness. last event 1 month ago (march 2022) and will pass out. takes clonazepam    SURGICAL HISTORY: Past Surgical History:  Procedure Laterality Date   CT RADIATION THERAPY GUIDE     left groin   dental implant     permanent implant   KNEE SURGERY Left    arthroscopy   LEFT HEART CATH AND CORONARY ANGIOGRAPHY Left 06/29/2017   Procedure: LEFT HEART CATH AND CORONARY ANGIOGRAPHY;  Surgeon: [name], MD;  Location: [facility];  Service: Cardiovascular;  Laterality: Left;   LYMPH NODE DISSECTION Left 04/16/2019   Procedure: Left inguinal Lymph Node Dissection;  Surgeon: [name], MD;  Location: [facility];  Service: General;  Laterality: Left;   MELANOMA EXCISION Left 04/16/2019   Procedure: MELANOMA EXCISION LEFT GROIN MASS;  Surgeon: [name], MD;  Location: [facility];  Service: General;  Laterality: Left;   MELANOMA EXCISION WITH SENTINEL LYMPH NODE BIOPSY Left 2012   Left calf    PACEMAKER INSERTION N/A 08/26/2018   Procedure: INSERTION PACEMAKER;  Surgeon: [name], MD;  Location: [facility];  Service: Cardiovascular;  Laterality: N/A;   PORTA CATH INSERTION N/A 08/26/2019   Procedure: PORTA CATH INSERTION;  Surgeon: Katha Cabal, MD;  Location: [facility];  Service: Cardiovascular;  Laterality:  N/A;   SUPERFICIAL LYMPH NODE BIOPSY / EXCISION Left 2020   lymph nodes removed around left groin melanoma site   TEMPORARY PACEMAKER N/A 08/25/2018   Procedure: TEMPORARY PACEMAKER;  Surgeon: [name], MD;  Location: [facility];  Service: Cardiovascular;  Laterality: N/A;   TOTAL HIP ARTHROPLASTY Left 07/14/2020   Procedure: TOTAL HIP ARTHROPLASTY;  Surgeon: Dereck Leep, MD;  Location: [facility];  Service: Orthopedics;  Laterality: Left;    SOCIAL HISTORY: Social History   Socioeconomic History   Marital status: Married    Spouse name: Vicente Males    Number of children: 7   Years of education: 12   Highest education level: Not on file  Occupational History    Comment: disability  Tobacco Use   Smoking status: Never   Smokeless tobacco: Never  Vaping Use   Vaping Use: Never used  Substance and Sexual Activity   Alcohol use: No   Drug use: No   Sexual activity: Not Currently  Other Topics Concern   Not on file  Social History Narrative  Lives with  Wife,   Has 2 small dogs   Caffeine use: sodas (2 per day)      Out of work on disability.  Has a walk in shower. No stairs to climb   Oncology treatment ongoing. Uses port a cath for treatment.      pacemaker   Social Determinants of Health   Financial Resource Strain: Not on file  Food Insecurity: Not on file  Transportation Needs: Not on file  Physical Activity: Not on file  Stress: Not on file  Social Connections: Not on file  Intimate Partner Violence: Not on file    FAMILY HISTORY: Family History  Problem Relation Age of Onset   Cancer Paternal Grandmother     ALLERGIES:  is allergic to ibuprofen and nsaids.  MEDICATIONS:  Current Outpatient Medications  Medication Sig Dispense Refill   allopurinol (ZYLOPRIM) 300 MG tablet Take 300 mg by mouth daily.     ASPIRIN 81 PO Take 1 tablet by mouth daily.     atorvastatin (LIPITOR) 20 MG tablet Take 20 mg by mouth daily.     clonazePAM (KLONOPIN)  0.5 MG tablet 1 tablet in the morning, 2 in the evening (Patient taking differently: Take 0.5 mg by mouth 2 (two) times daily. 1 tablet in the morning, 2 in the evening) 90 tablet 3   hydrocortisone 2.5 % ointment Apply topically 2 (two) times daily.     ketoconazole (NIZORAL) 2 % cream Apply 1 application topically daily.     lidocaine-prilocaine (EMLA) cream Apply 1 application topically as needed. Apply small amount of cream to port site approx 1-2 hours prior to appointment. 30 g 2   lisinopril (ZESTRIL) 20 MG tablet Take 20 mg by mouth daily.     metoprolol succinate (TOPROL-XL) 25 MG 24 hr tablet Take 25 mg by mouth daily.      Multiple Vitamin (MULTIVITAMIN WITH MINERALS) TABS tablet Take 1 tablet by mouth daily. Centrum Silver     omeprazole (PRILOSEC) 20 MG capsule TAKE 1 CAPSULE BY MOUTH EVERY DAY 30 capsule 1   ondansetron (ZOFRAN) 4 MG tablet TAKE 1 TABLET BY MOUTH EVERY 8 HOURS AS NEEDED FOR NAUSEA AND VOMITING 90 tablet 1   oxyCODONE (OXY IR/ROXICODONE) 5 MG immediate release tablet Take 1 tablet (5 mg total) by mouth every 4 (four) hours as needed for moderate pain (pain score 4-6). (Patient not taking: No sig reported) 30 tablet 0   No current facility-administered medications for this visit.     PHYSICAL EXAMINATION: ECOG PERFORMANCE STATUS: 1 - Symptomatic but completely ambulatory Vitals:   09/21/20 0845  BP: 129/79  Pulse: 85  Resp: 18  Temp: 97.8 F (36.6 C)   Filed Weights   09/21/20 0845  Weight: 233 lb 12.8 oz (106.1 kg)    Physical Exam Constitutional:      General: He is not in acute distress.    Comments: Patient ambulates independently  HENT:     Head: Normocephalic and atraumatic.  Eyes:     General: No scleral icterus.    Pupils: Pupils are equal, round, and reactive to light.  Cardiovascular:     Rate and Rhythm: Normal rate and regular rhythm.     Heart sounds: Normal heart sounds.  Pulmonary:     Effort: Pulmonary effort is normal. No  respiratory distress.     Breath sounds: No wheezing.  Abdominal:     General: Bowel sounds are normal. There is no distension.  Palpations: Abdomen is soft. There is no mass.     Tenderness: There is no abdominal tenderness.     Comments:  History left groin mass resection and radiation.   Musculoskeletal:        General: No deformity. Normal range of motion.     Cervical back: Normal range of motion and neck supple.     Comments: Left lower extremity edema Left hip replacement  Skin:    General: Skin is warm and dry.     Findings: No erythema or rash.  Neurological:     Mental Status: He is alert and oriented to person, place, and time. Mental status is at baseline.     Cranial Nerves: No cranial nerve deficit.     Coordination: Coordination normal.  Psychiatric:        Mood and Affect: Mood normal.   LABORATORY DATA:  I have reviewed the data as listed Lab Results  Component Value Date   WBC 4.9 09/07/2020   HGB 11.5 (L) 09/07/2020   HCT 34.5 (L) 09/07/2020   MCV 88.9 09/07/2020   PLT 200 09/07/2020   Recent Labs    11/27/19 0833 12/11/19 0854 12/25/19 0905 01/08/20 0851 08/10/20 0827 08/24/20 0851 09/07/20 0759  NA 140 139 140   < > 139 139 139  K 4.1 3.8 4.4   < > 3.9 4.1 3.8  CL 106 104 106   < > 106 105 104  CO2 _0 < > _1 GLUCOSE 125* 118* 109*   < > 103* 138* 142*  BUN 20 25* 20   < > 21* 36* 18  CREATININE 1.10 1.16 1.22   < > 1.11 1.23 1.24  CALCIUM 9.0 9.1 9.1   < > 8.8* 9.2 9.1  GFRNONAA >60 >60 >60   < > >60 >60 >60  GFRAA >60 >60 >60  --   --   --   --   PROT 7.1 7.3 7.0   < > 6.7 7.2 7.2  ALBUMIN 4.0 4.2 4.1   < > 3.7 4.1 4.1  AST _2 < > _3 ALT 40 39 32   < > 28 31 36  ALKPHOS 89 80 80   < > 90 94 94  BILITOT 0.6 0.8 0.9   < > 0.5 0.7 0.7   < > = values in this interval not displayed.    Iron/TIBC/Ferritin/ %Sat    Component Value Date/Time   IRON 85 10/02/2019 0841   TIBC 342 10/02/2019 0841   FERRITIN  61 10/02/2019 0841   IRONPCTSAT 25 10/02/2019 0841      RADIOGRAPHIC STUDIES: I have personally reviewed the radiological images as listed and agreed with the findings in the report. CT CHEST ABDOMEN PELVIS W CONTRAST  Result Date: 06/30/2020 CLINICAL DATA:  Metastatic melanoma, left calf, metastatic to left inguinal lymph nodes,, status post resection and radiation therapy, ongoing immunotherapy EXAM: CT CHEST, ABDOMEN, AND PELVIS WITH CONTRAST TECHNIQUE: Multidetector CT imaging of the chest, abdomen and pelvis was performed following the standard protocol during bolus administration of intravenous contrast. CONTRAST:  134m OMNIPAQUE IOHEXOL 300 MG/ML SOLN, additional oral enteric contrast COMPARISON:  03/31/2020 FINDINGS: CT CHEST FINDINGS Cardiovascular: Right chest port catheter. Left chest multi lead pacer. Normal heart size. Left coronary artery calcifications. No pericardial effusion. Mediastinum/Nodes: No enlarged mediastinal, hilar, or axillary lymph nodes. Thyroid gland, trachea, and esophagus demonstrate no significant findings. Lungs/Pleura:  Lungs are clear. No pleural effusion or pneumothorax. Musculoskeletal: No chest wall mass or suspicious bone lesions identified. CT ABDOMEN PELVIS FINDINGS Hepatobiliary: No solid liver abnormality is seen. Stable subcentimeter low-attenuation lesion of the peripheral right lobe of the liver (series 2, image 55). Hepatic steatosis. No gallstones, gallbladder wall thickening, or biliary dilatation. Pancreas: Unremarkable. No pancreatic ductal dilatation or surrounding inflammatory changes. Spleen: Normal in size without significant abnormality. Adrenals/Urinary Tract: Adrenal glands are unremarkable. Kidneys are normal, without renal calculi, solid lesion, or hydronephrosis. Bladder is unremarkable. Stomach/Bowel: Stomach is within normal limits. Appendix appears normal. No evidence of bowel wall thickening, distention, or inflammatory changes.  Vascular/Lymphatic: Aortic atherosclerosis. No enlarged abdominal or pelvic lymph nodes. Unchanged findings of left inguinal lymph node resection with overlying skin thickening and fat stranding in keeping with radiation therapy. Reproductive: No mass or other abnormality. Other: Fat containing umbilical hernia.  No abdominopelvic ascites. Musculoskeletal: No acute or significant osseous findings. IMPRESSION: 1. Stable postoperative appearance of the left groin. No evidence of local recurrence. 2. No evidence of metastatic disease in the chest, abdomen, or pelvis. 3. Hepatic steatosis. 4. Stable subcentimeter fluid attenuation lesion of the lateral right lobe of the liver, again almost certainly an incidental, benign cyst or hemangioma. 5. Coronary artery disease. Aortic Atherosclerosis (ICD10-I70.0). Electronically Signed   By: Eddie Candle M.D.   On: 06/30/2020 09:20   DG Hip Port Unilat With Pelvis 1V Left  Result Date: 07/14/2020 CLINICAL DATA:  Status post hip replacement. EXAM: DG HIP (WITH OR WITHOUT PELVIS) 1V PORT LEFT COMPARISON:  01/22/2020. FINDINGS: Patient status post total left hip replacement. Hardware intact. Anatomic alignment. No acute abnormality. IMPRESSION: Patient status post total left hip replacement. Hardware intact. Anatomic alignment. Electronically Signed   By: Marcello Moores  Register   On: 07/14/2020 15:08        ASSESSMENT & PLAN:  1. Malignant melanoma of overlapping sites ([place])   2. Encounter for antineoplastic immunotherapy   3. Vitiligo   4. Anemia, unspecified type    #Left inguinal nodal recurrence of melanoma.  Status post resection and adjuvant radiation. Stage IV Recurrent, now in NED Labs are reviewed and discussed with patient. Proceed with Nivolumab We dicussed about completing 2 years of treatment and if he remains in NED, consider close monitor after that.  Obtain surveillance CT in July 2022  # Left hip and knee arthritis-status post hip  replacement. Pain management per orthopedic surgeon.  #Vitiligo, stable #Normocytic anemia, due to recent hip surgery.  Hemoglobin has recovered to 11.5 which is close to his baseline.  Monitor. All questions were answered. The patient knows to call the clinic with any problems questions or concerns.  Follow-up 2 weeks We spent sufficient time to discuss many aspect of care, questions were answered to patient's satisfaction.   [name], MD, PhD Hematology Oncology [facility] Pager- [phone] 09/21/2020

## 2020-09-21 NOTE — Patient Instructions (Signed)
[facility]  Discharge Instructions: Thank you for choosing [facility] to provide your oncology and hematology care.  If you have a lab appointment with the Cancer Center, please go directly to the Cancer Center and check in at the registration area.  Wear comfortable clothing and clothing appropriate for easy access to any Portacath or PICC line.   We strive to give you quality time with your provider. You may need to reschedule your appointment if you arrive late (15 or more minutes).  Arriving late affects you and other patients whose appointments are after yours.  Also, if you miss three or more appointments without notifying the office, you may be dismissed from the clinic at the provider's discretion.      For prescription refill requests, have your pharmacy contact our office and allow 72 hours for refills to be completed.    Today you received the following chemotherapy and/or immunotherapy agents - nivolumab      To help prevent nausea and vomiting after your treatment, we encourage you to take your nausea medication as directed.  BELOW ARE SYMPTOMS THAT SHOULD BE REPORTED IMMEDIATELY: *FEVER GREATER THAN 100.4 F (38 C) OR HIGHER *CHILLS OR SWEATING *NAUSEA AND VOMITING THAT IS NOT CONTROLLED WITH YOUR NAUSEA MEDICATION *UNUSUAL SHORTNESS OF BREATH *UNUSUAL BRUISING OR BLEEDING *URINARY PROBLEMS (pain or burning when urinating, or frequent urination) *BOWEL PROBLEMS (unusual diarrhea, constipation, pain near the anus) TENDERNESS IN MOUTH AND THROAT WITH OR WITHOUT PRESENCE OF ULCERS (sore throat, sores in mouth, or a toothache) UNUSUAL RASH, SWELLING OR PAIN  UNUSUAL VAGINAL DISCHARGE OR ITCHING   Items with * indicate a potential emergency and should be followed up as soon as possible or go to the Emergency Department if any problems should occur.  Please show the CHEMOTHERAPY ALERT CARD or IMMUNOTHERAPY ALERT CARD at check-in  to the Emergency Department and triage nurse.  Should you have questions after your visit or need to cancel or reschedule your appointment, please contact [facility]  [phone] and follow the prompts.  Office hours are 8:00 a.m. to 4:30 p.m. Monday - Friday. Please note that voicemails left after 4:00 p.m. may not be returned until the following business day.  We are closed weekends and major holidays. You have access to a nurse at all times for urgent questions. Please call the main number to the clinic [phone] and follow the prompts.  For any non-urgent questions, you may also contact your provider using MyChart. We now offer e-Visits for anyone 18 and older to request care online for non-urgent symptoms. For details visit [email or web address].   Also download the MyChart app! Go to the app store, search "MyChart", open the app, select , and log in with your MyChart username and password.  Due to Covid, a mask is required upon entering the hospital/clinic. If you do not have a mask, one will be given to you upon arrival. For doctor visits, patients may have 1 support person aged 18 or older with them. For treatment visits, patients cannot have anyone with them due to current Covid guidelines and our immunocompromised population.   Nivolumab injection What is this medication? NIVOLUMAB (nye VOL ue mab) is a monoclonal antibody. It treats certain types of cancer. Some of the cancers treated are colon cancer, head and neck cancer, Hodgkin lymphoma, lung cancer, and melanoma. This medicine may be used for other purposes; ask your health care provider or pharmacist   if you have questions. COMMON BRAND NAME(S): Opdivo What should I tell my care team before I take this medication? They need to know if you have any of these conditions: autoimmune diseases like Crohn's disease, ulcerative colitis, or lupus have had or planning to have an allogeneic  stem cell transplant (uses someone else's stem cells) history of chest radiation history of organ transplant nervous system problems like myasthenia gravis or Guillain-Barre syndrome an unusual or allergic reaction to nivolumab, other medicines, foods, dyes, or preservatives pregnant or trying to get pregnant breast-feeding How should I use this medication? This medicine is for infusion into a vein. It is given by a health care professional in a hospital or clinic setting. A special MedGuide will be given to you before each treatment. Be sure to read this information carefully each time. Talk to your pediatrician regarding the use of this medicine in children. While this drug may be prescribed for children as young as 12 years for selected conditions, precautions do apply. Overdosage: If you think you have taken too much of this medicine contact a poison control center or emergency room at once. NOTE: This medicine is only for you. Do not share this medicine with others. What if I miss a dose? It is important not to miss your dose. Call your doctor or health care professional if you are unable to keep an appointment. What may interact with this medication? Interactions have not been studied. This list may not describe all possible interactions. Give your health care provider a list of all the medicines, herbs, non-prescription drugs, or dietary supplements you use. Also tell them if you smoke, drink alcohol, or use illegal drugs. Some items may interact with your medicine. What should I watch for while using this medication? This drug may make you feel generally unwell. Continue your course of treatment even though you feel ill unless your doctor tells you to stop. You may need blood work done while you are taking this medicine. Do not become pregnant while taking this medicine or for 5 months after stopping it. Women should inform their doctor if they wish to become pregnant or think they might  be pregnant. There is a potential for serious side effects to an unborn child. Talk to your health care professional or pharmacist for more information. Do not breast-feed an infant while taking this medicine or for 5 months after stopping it. What side effects may I notice from receiving this medication? Side effects that you should report to your doctor or health care professional as soon as possible: allergic reactions like skin rash, itching or hives, swelling of the face, lips, or tongue breathing problems blood in the urine bloody or watery diarrhea or black, tarry stools changes in emotions or moods changes in vision chest pain cough dizziness feeling faint or lightheaded, falls fever, chills headache with fever, neck stiffness, confusion, loss of memory, sensitivity to light, hallucination, loss of contact with reality, or seizures joint pain mouth sores redness, blistering, peeling or loosening of the skin, including inside the mouth severe muscle pain or weakness signs and symptoms of high blood sugar such as dizziness; dry mouth; dry skin; fruity breath; nausea; stomach pain; increased hunger or thirst; increased urination signs and symptoms of kidney injury like trouble passing urine or change in the amount of urine signs and symptoms of liver injury like dark yellow or brown urine; general ill feeling or flu-like symptoms; light-colored stools; loss of appetite; nausea; right upper belly pain; unusually   weak or tired; yellowing of the eyes or skin swelling of the ankles, feet, hands trouble passing urine or change in the amount of urine unusually weak or tired weight gain or loss Side effects that usually do not require medical attention (report to your doctor or health care professional if they continue or are bothersome): bone pain constipation decreased appetite diarrhea muscle pain nausea, vomiting tiredness This list may not describe all possible side effects. Call  your doctor for medical advice about side effects. You may report side effects to FDA at 1-800-FDA-1088. Where should I keep my medication? This drug is given in a hospital or clinic and will not be stored at home. NOTE: This sheet is a summary. It may not cover all possible information. If you have questions about this medicine, talk to your doctor, pharmacist, or health care provider.  2022 Elsevier/Gold Standard (2019-07-16 10:08:25)  

## 2020-09-24 ENCOUNTER — Other Ambulatory Visit: Payer: Self-pay | Admitting: Oncology

## 2020-10-05 ENCOUNTER — Ambulatory Visit
Admission: RE | Admit: 2020-10-05 | Discharge: 2020-10-05 | Disposition: A | Discharge status: Home / Self Care | Source: Ambulatory Visit | Attending: Oncology | Admitting: Oncology

## 2020-10-05 ENCOUNTER — Inpatient Hospital Stay: Attending: Oncology

## 2020-10-05 ENCOUNTER — Encounter: Payer: Self-pay | Admitting: Oncology

## 2020-10-05 ENCOUNTER — Inpatient Hospital Stay (HOSPITAL_BASED_OUTPATIENT_CLINIC_OR_DEPARTMENT_OTHER): Admitting: Oncology

## 2020-10-05 ENCOUNTER — Inpatient Hospital Stay

## 2020-10-05 ENCOUNTER — Other Ambulatory Visit: Payer: Self-pay

## 2020-10-05 VITALS — BP 138/53 | HR 63 | Temp 98.0°F | Resp 18 | Wt 239.0 lb

## 2020-10-05 DIAGNOSIS — L80 Vitiligo: Secondary | ICD-10-CM

## 2020-10-05 DIAGNOSIS — C4359 Malignant melanoma of other part of trunk: Secondary | ICD-10-CM | POA: Insufficient documentation

## 2020-10-05 DIAGNOSIS — Z5112 Encounter for antineoplastic immunotherapy: Secondary | ICD-10-CM

## 2020-10-05 DIAGNOSIS — C438 Malignant melanoma of overlapping sites of skin: Secondary | ICD-10-CM

## 2020-10-05 DIAGNOSIS — Z95828 Presence of other vascular implants and grafts: Secondary | ICD-10-CM

## 2020-10-05 DIAGNOSIS — Z79899 Other long term (current) drug therapy: Secondary | ICD-10-CM | POA: Insufficient documentation

## 2020-10-05 DIAGNOSIS — D649 Anemia, unspecified: Secondary | ICD-10-CM

## 2020-10-05 LAB — COMPREHENSIVE METABOLIC PANEL
ALT: 45 U/L — ABNORMAL HIGH (ref 0–44)
AST: 40 U/L (ref 15–41)
Albumin: 4 g/dL (ref 3.5–5.0)
Alkaline Phosphatase: 89 U/L (ref 38–126)
Anion gap: 9 (ref 5–15)
BUN: 28 mg/dL — ABNORMAL HIGH (ref 8–23)
CO2: 20 mmol/L — ABNORMAL LOW (ref 22–32)
Calcium: 9.2 mg/dL (ref 8.9–10.3)
Chloride: 108 mmol/L (ref 98–111)
Creatinine, Ser: 1.17 mg/dL (ref 0.61–1.24)
GFR, Estimated: >60 mL/min (ref >60)
Glucose, Bld: 138 mg/dL — ABNORMAL HIGH (ref 70–99)
Potassium: 4.2 mmol/L (ref 3.5–5.1)
Sodium: 137 mmol/L (ref 135–145)
Total Bilirubin: 0.3 mg/dL (ref 0.3–1.2)
Total Protein: 7.2 g/dL (ref 6.5–8.1)

## 2020-10-05 LAB — CBC WITH DIFFERENTIAL/PLATELET
Abs Immature Granulocytes: 0.04 10*3/uL (ref 0.00–0.07)
Basophils Absolute: 0 10*3/uL (ref 0.0–0.1)
Basophils Relative: 1 %
Eosinophils Absolute: 0.2 10*3/uL (ref 0.0–0.5)
Eosinophils Relative: 5 %
HCT: 34 % — ABNORMAL LOW (ref 39.0–52.0)
Hemoglobin: 11.2 g/dL — ABNORMAL LOW (ref 13.0–17.0)
Immature Granulocytes: 1 %
Lymphocytes Relative: 21 %
Lymphs Abs: 0.9 10*3/uL (ref 0.7–4.0)
MCH: 29.1 pg (ref 26.0–34.0)
MCHC: 32.9 g/dL (ref 30.0–36.0)
MCV: 88.3 fL (ref 80.0–100.0)
Monocytes Absolute: 0.5 10*3/uL (ref 0.1–1.0)
Monocytes Relative: 11 %
Neutro Abs: 2.7 10*3/uL (ref 1.7–7.7)
Neutrophils Relative %: 61 %
Platelets: 173 10*3/uL (ref 150–400)
RBC: 3.85 MIL/uL — ABNORMAL LOW (ref 4.22–5.81)
RDW: 13.7 % (ref 11.5–15.5)
WBC: 4.3 10*3/uL (ref 4.0–10.5)
nRBC: 0 % (ref 0.0–0.2)

## 2020-10-05 LAB — LACTATE DEHYDROGENASE: LDH: 129 U/L (ref 98–192)

## 2020-10-05 LAB — TSH: TSH: 2.891 u[IU]/mL (ref 0.350–4.500)

## 2020-10-05 LAB — T4, FREE: Free T4: 0.7 ng/dL (ref 0.61–1.12)

## 2020-10-05 MED ORDER — SODIUM CHLORIDE 0.9% FLUSH
10.0000 mL | Freq: Once | INTRAVENOUS | Status: AC
  Administered 2020-10-05: 10 mL via INTRAVENOUS
  Filled 2020-10-05: qty 10

## 2020-10-05 MED ORDER — LIDOCAINE-PRILOCAINE 2.5-2.5 % EX CREA: 1.0000 "application " | TOPICAL_CREAM | CUTANEOUS | 2 refills | Status: DC | PRN

## 2020-10-05 MED ORDER — HEPARIN SOD (PORK) LOCK FLUSH 100 UNIT/ML IV SOLN
500.0000 [IU] | Freq: Once | INTRAVENOUS | Status: DC
  Filled 2020-10-05: qty 5

## 2020-10-05 MED ORDER — IOHEXOL 300 MG/ML  SOLN
25.0000 mL | Freq: Once | INTRAMUSCULAR | Status: AC | PRN
  Administered 2020-10-05: 25 mL

## 2020-10-05 MED ORDER — SODIUM CHLORIDE 0.9 % IV SOLN
Freq: Once | INTRAVENOUS | Status: AC
  Filled 2020-10-05: qty 250

## 2020-10-05 MED ORDER — SODIUM CHLORIDE 0.9 % IV SOLN
240.0000 mg | Freq: Once | INTRAVENOUS | Status: AC
  Administered 2020-10-05: 240 mg via INTRAVENOUS
  Filled 2020-10-05: qty 24

## 2020-10-05 NOTE — Patient Instructions (Signed)
[facility]  Discharge Instructions: Thank you for choosing [facility] to provide your oncology and hematology care.  If you have a lab appointment with the [facility], please go directly to the [facility] and check in at the registration area.  Wear comfortable clothing and clothing appropriate for easy access to any Portacath or PICC line.   We strive to give you quality time with your provider. You may need to reschedule your appointment if you arrive late (15 or more minutes).  Arriving late affects you and other patients whose appointments are after yours.  Also, if you miss three or more appointments without notifying the office, you may be dismissed from the clinic at the provider's discretion.      For prescription refill requests, have your pharmacy contact our office and allow 72 hours for refills to be completed.    Today you received the following chemotherapy and/or immunotherapy agents OPDIVO      To help prevent nausea and vomiting after your treatment, we encourage you to take your nausea medication as directed.  BELOW ARE SYMPTOMS THAT SHOULD BE REPORTED IMMEDIATELY: *FEVER GREATER THAN 100.4 F (38 C) OR HIGHER *CHILLS OR SWEATING *NAUSEA AND VOMITING THAT IS NOT CONTROLLED WITH YOUR NAUSEA MEDICATION *UNUSUAL SHORTNESS OF BREATH *UNUSUAL BRUISING OR BLEEDING *URINARY PROBLEMS (pain or burning when urinating, or frequent urination) *BOWEL PROBLEMS (unusual diarrhea, constipation, pain near the anus) TENDERNESS IN MOUTH AND THROAT WITH OR WITHOUT PRESENCE OF ULCERS (sore throat, sores in mouth, or a toothache) UNUSUAL RASH, SWELLING OR PAIN  UNUSUAL VAGINAL DISCHARGE OR ITCHING   Items with * indicate a potential emergency and should be followed up as soon as possible or go to the Emergency Department if any problems should occur.  Please show the CHEMOTHERAPY ALERT CARD or IMMUNOTHERAPY ALERT CARD at check-in to  the Emergency Department and triage nurse.  Should you have questions after your visit or need to cancel or reschedule your appointment, please contact [facility]  [phone] and follow the prompts.  Office hours are 8:00 a.m. to 4:30 p.m. Monday - Friday. Please note that voicemails left after 4:00 p.m. may not be returned until the following business day.  We are closed weekends and major holidays. You have access to a nurse at all times for urgent questions. Please call the main number to the clinic [phone] and follow the prompts.  For any non-urgent questions, you may also contact your provider using MyChart. We now offer e-Visits for anyone 14 and older to request care online for non-urgent symptoms. For details visit [email or web address].   Also download the MyChart app! Go to the app store, search "MyChart", open the app, select [facility], and log in with your MyChart username and password.  Due to Covid, a mask is required upon entering the hospital/clinic. If you do not have a mask, one will be given to you upon arrival. For doctor visits, patients may have 1 support person aged 62 or older with them. For treatment visits, patients cannot have anyone with them due to current Covid guidelines and our immunocompromised population.   Nivolumab injection What is this medication? NIVOLUMAB (nye VOL ue mab) is a monoclonal antibody. It treats certain types of cancer. Some of the cancers treated are colon cancer, head and neck cancer,Hodgkin lymphoma, lung cancer, and melanoma. This medicine may be used for other purposes; ask your health care provider orpharmacist if you have  questions. COMMON BRAND NAME(S): Opdivo What should I tell my care team before I take this medication? They need to know if you have any of these conditions: autoimmune diseases like Crohn's disease, ulcerative colitis, or lupus have had or planning to have an allogeneic stem  cell transplant (uses someone else's stem cells) history of chest radiation history of organ transplant nervous system problems like myasthenia gravis or Guillain-Barre syndrome an unusual or allergic reaction to nivolumab, other medicines, foods, dyes, or preservatives pregnant or trying to get pregnant breast-feeding How should I use this medication? This medicine is for infusion into a vein. It is given by a health careprofessional in a hospital or clinic setting. A special MedGuide will be given to you before each treatment. Be sure to readthis information carefully each time. Talk to your pediatrician regarding the use of this medicine in children. While this drug may be prescribed for children as young as 12 years for selectedconditions, precautions do apply. Overdosage: If you think you have taken too much of this medicine contact apoison control center or emergency room at once. NOTE: This medicine is only for you. Do not share this medicine with others. What if I miss a dose? It is important not to miss your dose. Call your doctor or health careprofessional if you are unable to keep an appointment. What may interact with this medication? Interactions have not been studied. This list may not describe all possible interactions. Give your health care provider a list of all the medicines, herbs, non-prescription drugs, or dietary supplements you use. Also tell them if you smoke, drink alcohol, or use illegaldrugs. Some items may interact with your medicine. What should I watch for while using this medication? This drug may make you feel generally unwell. Continue your course of treatmenteven though you feel ill unless your doctor tells you to stop. You may need blood work done while you are taking this medicine. Do not become pregnant while taking this medicine or for 5 months after stopping it. Women should inform their doctor if they wish to become pregnant or think they might be  pregnant. There is a potential for serious side effects to an unborn child. Talk to your health care professional or pharmacist for more information. Do not breast-feed an infant while taking this medicine orfor 5 months after stopping it. What side effects may I notice from receiving this medication? Side effects that you should report to your doctor or health care professionalas soon as possible: allergic reactions like skin rash, itching or hives, swelling of the face, lips, or tongue breathing problems blood in the urine bloody or watery diarrhea or black, tarry stools changes in emotions or moods changes in vision chest pain cough dizziness feeling faint or lightheaded, falls fever, chills headache with fever, neck stiffness, confusion, loss of memory, sensitivity to light, hallucination, loss of contact with reality, or seizures joint pain mouth sores redness, blistering, peeling or loosening of the skin, including inside the mouth severe muscle pain or weakness signs and symptoms of high blood sugar such as dizziness; dry mouth; dry skin; fruity breath; nausea; stomach pain; increased hunger or thirst; increased urination signs and symptoms of kidney injury like trouble passing urine or change in the amount of urine signs and symptoms of liver injury like dark yellow or brown urine; general ill feeling or flu-like symptoms; light-colored stools; loss of appetite; nausea; right upper belly pain; unusually weak or tired; yellowing of the eyes or skin swelling of the  ankles, feet, hands trouble passing urine or change in the amount of urine unusually weak or tired weight gain or loss Side effects that usually do not require medical attention (report to yourdoctor or health care professional if they continue or are bothersome): bone pain constipation decreased appetite diarrhea muscle pain nausea, vomiting tiredness This list may not describe all possible side effects. Call your  doctor for medical advice about side effects. You may report side effects to FDA at1-800-FDA-1088. Where should I keep my medication? This drug is given in a hospital or clinic and will not be stored at home. NOTE: This sheet is a summary. It may not cover all possible information. If you have questions about this medicine, talk to your doctor, pharmacist, orhealth care provider.  2022 Elsevier/Gold Standard (2019-07-16 10:08:25)

## 2020-10-05 NOTE — Progress Notes (Signed)
Patient has a red rash on left forearm for a couple days, no itching

## 2020-10-05 NOTE — Progress Notes (Signed)
Hematology/Oncology follow up  note [facility] Telephone:([phone]) [phone] Fax:(336) [phone]   Patient Care Team: [facility] as PCP - General [name], MD as Consulting Physician (Oncology)  REFERRING PROVIDER: [facility]  CHIEF COMPLAINTS/REASON FOR VISIT:  Follow up for melanoma HISTORY OF PRESENTING ILLNESS:   Edward Pitts is a  60 y.o.  male with PMH listed below was seen in consultation at the request of  [facility]  for evaluation of inguinal mass Patient presented to emergency room 3 days ago complaining about left ing uinal mass discomfort. Reports that he has really noticed the mass growing for the past 1 months. He has a history of left lower extremity melanoma in 2011, status post local excision.  Pain was increased with squatting of laxation. He was advised to take Tylenol for pain. Denies any fever, chills, night sweating.  He does feel mild nauseated. Appetite is fair.  He has lost about 10 pounds since earlier this year. 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  Left inguinal and left iliac nodes which are suspicious for involvement.  There are also 2 small nonspecific hypodense lesions within the right liver, nonspecific.  # patient underwent left groin mass resection on 04/16/2019. Resection pathology showed malignant melanoma, replacing a lymph node, with extracapsular extension, peripheral and deep margins involved.  Left inguinal contents, all 7 lymph nodes were negative for melanoma in the lymph nodes.  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  # PDL1 80% TPS  # 04/16/2019. underwent left groin mass resection   07/07/2019  Status post adjuvant radiation and finished radiation  Patient has Mediport placed to facilitate immunotherapy treatments.  06/29/2020, CT  chest abdomen pelvis showed stable postoperative appearance of the left groin.  No evidence of local recurrence.  No evidence of metastatic disease in the chest abdomen or pelvis.  Hepatic steatosis.  Stable subcentimeter fluid attenuation lesion of the lateral right lobe of the liver, likely benign cyst or hemangioma.  Coronary artery disease.  Aortic atherosclerosis   INTERVAL HISTORY Edward Pitts is a 60 y.o. male who has above history reviewed by me today presents for follow up visit for management of inguinal nodal recurrence of melanoma Problems and complaints are listed below:  He noticed " rash' on his left forearm. No itching.  No abdominal pain, diarrhea, SOB.  Joint pain is stable.  No blood return from his port today  . :Review of Systems  Constitutional:  Negative for appetite change, chills, fatigue, fever and unexpected weight change.  HENT:   Negative for hearing loss and voice change.   Eyes:  Negative for eye problems and icterus ().  Respiratory:  Negative for chest tightness, cough and shortness of breath.   Cardiovascular:  Negative for chest pain and leg swelling.  Gastrointestinal:  Negative for abdominal distention and abdominal pain.  Endocrine: Negative for hot flashes.  Genitourinary:  Negative for difficulty urinating, dysuria and frequency.   Musculoskeletal:  Positive for arthralgias.       Status post left hip replacement  Skin:  Negative for itching and rash.       Skin hypo-pigmentation on upper extremities, no change  Neurological:  Negative for light-headedness and numbness.  Hematological:  Negative for adenopathy. Does not bruise/bleed easily.  Psychiatric/Behavioral:  Negative for confusion.    MEDICAL HISTORY:  Past Medical History:  Diagnosis Date   Anemia    iron treatments   Anxiety    Aortic atherosclerosis ([place])    Arthritis    Cancer of groin ([place]) 2021   left groin, resected, radiation   Cataract    Complication of anesthesia     PONV   Coronary artery disease    Dizziness of unknown etiology    has led to seizures and passing out.   Family history of adverse reaction to anesthesia    PONV mother   GERD (gastroesophageal reflux disease)    History of complete heart block    PPM placed   Hyperlipidemia    Hypertension    LBBB (left bundle branch block)    Lymphedema of left leg    uses thigh high compression stockings   Melanoma ([place]) 2012   skin cancer, left thigh   OSA on CPAP    PONV (postoperative nausea and vomiting) 04/16/2019   Port-A-Cath in place    RIGHT chest wall   Presence of cardiac pacemaker    [employer]   Seizures ([place])    still has episodes of dizziness. last event 1 month ago (march 2022) and will pass out. takes clonazepam    SURGICAL HISTORY: Past Surgical History:  Procedure Laterality Date   CT RADIATION THERAPY GUIDE     left groin   dental implant     permanent implant   KNEE SURGERY Left    arthroscopy   LEFT HEART CATH AND CORONARY ANGIOGRAPHY Left 06/29/2017   Procedure: LEFT HEART CATH AND CORONARY ANGIOGRAPHY;  Surgeon: [name], MD;  Location: [facility];  Service: Cardiovascular;  Laterality: Left;   LYMPH NODE DISSECTION Left 04/16/2019   Procedure: Left inguinal Lymph Node Dissection;  Surgeon: [name], MD;  Location: [facility];  Service: General;  Laterality: Left;   MELANOMA EXCISION Left 04/16/2019   Procedure: MELANOMA EXCISION LEFT GROIN MASS;  Surgeon: [name], MD;  Location: [facility];  Service: General;  Laterality: Left;   MELANOMA EXCISION WITH SENTINEL LYMPH NODE BIOPSY Left 2012   Left calf    PACEMAKER INSERTION N/A 08/26/2018   Procedure: INSERTION PACEMAKER;  Surgeon: [name], MD;  Location: [facility];  Service: Cardiovascular;  Laterality: N/A;   PORTA CATH INSERTION N/A 08/26/2019   Procedure: PORTA CATH INSERTION;  Surgeon: Katha Cabal, MD;  Location: [facility];  Service: Cardiovascular;  Laterality:  N/A;   SUPERFICIAL LYMPH NODE BIOPSY / EXCISION Left 2020   lymph nodes removed around left groin melanoma site   TEMPORARY PACEMAKER N/A 08/25/2018   Procedure: TEMPORARY PACEMAKER;  Surgeon: [name], MD;  Location: [facility];  Service: Cardiovascular;  Laterality: N/A;   TOTAL HIP ARTHROPLASTY Left 07/14/2020   Procedure: TOTAL HIP ARTHROPLASTY;  Surgeon: Dereck Leep, MD;  Location: [facility];  Service: Orthopedics;  Laterality: Left;    SOCIAL HISTORY: Social History   Socioeconomic History   Marital status: Married    Spouse name: Vicente Males    Number of children: 7   Years of education: 12   Highest education level: Not on file  Occupational History    Comment: disability  Tobacco Use   Smoking status: Never   Smokeless tobacco: Never  Vaping Use   Vaping Use: Never used  Substance and Sexual Activity   Alcohol use: No   Drug use: No   Sexual activity: Not Currently  Other Topics Concern   Not on file  Social History Narrative   Lives with  Wife,   Has 2 small dogs   Caffeine use: sodas (2 per day)      Out of work on disability.  Has a walk in shower. No stairs to climb   Oncology treatment ongoing. Uses port a cath for treatment.      pacemaker   Social Determinants of Health   Financial Resource Strain: Not on file  Food Insecurity: Not on file  Transportation Needs: Not on file  Physical Activity: Not on file  Stress: Not on file  Social Connections: Not on file  Intimate Partner Violence: Not on file    FAMILY HISTORY: Family History  Problem Relation Age of Onset   Cancer Paternal Grandmother     ALLERGIES:  is allergic to ibuprofen and nsaids.  MEDICATIONS:  Current Outpatient Medications  Medication Sig Dispense Refill   allopurinol (ZYLOPRIM) 300 MG tablet Take 300 mg by mouth daily.     ASPIRIN 81 PO Take 1 tablet by mouth daily.     atorvastatin (LIPITOR) 20 MG tablet Take 20 mg by mouth daily.     clonazePAM (KLONOPIN)  0.5 MG tablet 1 tablet in the morning, 2 in the evening (Patient taking differently: Take 0.5 mg by mouth 2 (two) times daily. 1 tablet in the morning, 2 in the evening) 90 tablet 3   hydrocortisone 2.5 % ointment Apply topically 2 (two) times daily.     ketoconazole (NIZORAL) 2 % cream Apply 1 application topically daily.     lisinopril (ZESTRIL) 20 MG tablet Take 20 mg by mouth daily.     metoprolol succinate (TOPROL-XL) 25 MG 24 hr tablet Take 25 mg by mouth daily.      Multiple Vitamin (MULTIVITAMIN WITH MINERALS) TABS tablet Take 1 tablet by mouth daily. Centrum Silver     omeprazole (PRILOSEC) 20 MG capsule TAKE 1 CAPSULE BY MOUTH EVERY DAY 30 capsule 1   ondansetron (ZOFRAN) 4 MG tablet TAKE 1 TABLET BY MOUTH EVERY 8 HOURS AS NEEDED FOR NAUSEA AND VOMITING 90 tablet 1   lidocaine-prilocaine (EMLA) cream Apply 1 application topically as needed. Apply small amount of cream to port site approx 1-2 hours prior to appointment. 30 g 2   oxyCODONE (OXY IR/ROXICODONE) 5 MG immediate release tablet Take 1 tablet (5 mg total) by mouth every 4 (four) hours as needed for moderate pain (pain score 4-6). (Patient not taking: No sig reported) 30 tablet 0   No current facility-administered medications for this visit.     PHYSICAL EXAMINATION: ECOG PERFORMANCE STATUS: 1 - Symptomatic but completely ambulatory Vitals:   10/05/20 0856  BP: (!) 138/53  Pulse: 63  Resp: 18  Temp: 98 F (36.7 C)  SpO2: 98%   Filed Weights   10/05/20 0856  Weight: 239 lb (108.4 kg)    Physical Exam Constitutional:      General: He is not in acute distress.    Comments: Patient ambulates independently  HENT:     Head: Normocephalic and atraumatic.  Eyes:     General: No scleral icterus.    Pupils: Pupils are equal, round, and reactive to light.  Cardiovascular:     Rate and Rhythm: Normal rate and regular rhythm.     Heart sounds: Normal heart sounds.  Pulmonary:     Effort: Pulmonary effort is normal. No  respiratory distress.     Breath sounds: No wheezing.  Abdominal:     General: Bowel sounds are normal.  There is no distension.     Palpations: Abdomen is soft. There is no mass.     Tenderness: There is no abdominal tenderness.     Comments:  History left groin mass resection and radiation.   Musculoskeletal:        General: No deformity. Normal range of motion.     Cervical back: Normal range of motion and neck supple.     Comments: Left lower extremity edema Left hip replacement  Skin:    General: Skin is warm and dry.  Neurological:     Mental Status: He is alert and oriented to person, place, and time. Mental status is at baseline.     Cranial Nerves: No cranial nerve deficit.     Coordination: Coordination normal.  Psychiatric:        Mood and Affect: Mood normal.   There a few non raised red color spots on his left forearm.  LABORATORY DATA:  I have reviewed the data as listed Lab Results  Component Value Date   WBC 4.3 10/05/2020   HGB 11.2 (L) 10/05/2020   HCT 34.0 (L) 10/05/2020   MCV 88.3 10/05/2020   PLT 173 10/05/2020   Recent Labs    11/27/19 0833 12/11/19 0854 12/25/19 0905 01/08/20 0851 09/07/20 0759 09/21/20 0804 10/05/20 0831  NA 140 139 140   < > 139 137 137  K 4.1 3.8 4.4   < > 3.8 4.3 4.2  CL 106 104 106   < > 104 105 108  CO2 $Re'24 27 27   'aSQ$ < > 26 24 20*  GLUCOSE 125* 118* 109*   < > 142* 132* 138*  BUN 20 25* 20   < > 18 31* 28*  CREATININE 1.10 1.16 1.22   < > 1.24 1.14 1.17  CALCIUM 9.0 9.1 9.1   < > 9.1 9.0 9.2  GFRNONAA >60 >60 >60   < > >60 >60 >60  GFRAA >60 >60 >60  --   --   --   --   PROT 7.1 7.3 7.0   < > 7.2 6.9 7.2  ALBUMIN 4.0 4.2 4.1   < > 4.1 4.0 4.0  AST $Re'29 28 26   'PMG$ < > 28 24 40  ALT 40 39 32   < > 36 30 45*  ALKPHOS 89 80 80   < > 94 89 89  BILITOT 0.6 0.8 0.9   < > 0.7 0.5 0.3   < > = values in this interval not displayed.    Iron/TIBC/Ferritin/ %Sat    Component Value Date/Time   IRON 85 10/02/2019 0841   TIBC 342  10/02/2019 0841   FERRITIN 61 10/02/2019 0841   IRONPCTSAT 25 10/02/2019 0841      RADIOGRAPHIC STUDIES: I have personally reviewed the radiological images as listed and agreed with the findings in the report. DG Hip Port Unilat With Pelvis 1V Left  Result Date: 07/14/2020 CLINICAL DATA:  Status post hip replacement. EXAM: DG HIP (WITH OR WITHOUT PELVIS) 1V PORT LEFT COMPARISON:  01/22/2020. FINDINGS: Patient status post total left hip replacement. Hardware intact. Anatomic alignment. No acute abnormality. IMPRESSION: Patient status post total left hip replacement. Hardware intact. Anatomic alignment. Electronically Signed   By: Marcello Moores  Register   On: 07/14/2020 15:08        ASSESSMENT & PLAN:  1. Malignant melanoma of overlapping sites ([place])   2. Encounter for antineoplastic immunotherapy   3. Vitiligo   4. Port-A-Cath in place    #  Left inguinal nodal recurrence of melanoma.  Status post resection and adjuvant radiation. Stage IV Recurrent, now in NED Labs are reviewed and discussed with patient. Proceed with Nivolumab today.  We dicussed about completing 2 years of treatment and if he remains in NED, consider close monitor after that.  Obtain surveillance CT in July 2022  # Left hip and knee arthritis-status post hip replacement. Pain management per orthopedic surgeon.  # left forearm skin lesion, non raised, non itching. It appears more like fresh bruising.  Monitor symptoms.  # No blood return from medi port, dye study #Vitiligo, stable #Normocytic anemia, due to recent hip surgery.  Hemoglobin has recovered to 11.5 which is close to his baseline.  Monitor. All questions were answered. The patient knows to call the clinic with any problems questions or concerns.  Follow-up 2 weeks We spent sufficient time to discuss many aspect of care, questions were answered to patient's satisfaction.   [name], MD, PhD Hematology Oncology [facility] at [facility] Pager- [phone] 10/05/2020

## 2020-10-08 ENCOUNTER — Telehealth: Payer: Self-pay

## 2020-10-08 NOTE — Telephone Encounter (Signed)
-----   Message from [name], MD sent at 10/07/2020 10:46 PM EDT ----- Please arrange him to do cath flow at next treatment

## 2020-10-08 NOTE — Telephone Encounter (Signed)
Scheduling as added time to patient's appt to allow for cath flow at next tx.

## 2020-10-13 ENCOUNTER — Ambulatory Visit: Admission: RE | Admit: 2020-10-13 | Source: Ambulatory Visit

## 2020-10-19 ENCOUNTER — Inpatient Hospital Stay

## 2020-10-19 ENCOUNTER — Encounter: Payer: Self-pay | Admitting: Oncology

## 2020-10-19 ENCOUNTER — Inpatient Hospital Stay (HOSPITAL_BASED_OUTPATIENT_CLINIC_OR_DEPARTMENT_OTHER): Admitting: Oncology

## 2020-10-19 VITALS — BP 121/74 | HR 84 | Temp 98.1°F | Resp 18 | Wt 235.5 lb

## 2020-10-19 DIAGNOSIS — Z5112 Encounter for antineoplastic immunotherapy: Secondary | ICD-10-CM

## 2020-10-19 DIAGNOSIS — D649 Anemia, unspecified: Secondary | ICD-10-CM | POA: Diagnosis not present

## 2020-10-19 DIAGNOSIS — C438 Malignant melanoma of overlapping sites of skin: Secondary | ICD-10-CM | POA: Diagnosis not present

## 2020-10-19 DIAGNOSIS — L80 Vitiligo: Secondary | ICD-10-CM

## 2020-10-19 DIAGNOSIS — Z95828 Presence of other vascular implants and grafts: Secondary | ICD-10-CM

## 2020-10-19 DIAGNOSIS — Z79899 Other long term (current) drug therapy: Secondary | ICD-10-CM | POA: Diagnosis not present

## 2020-10-19 DIAGNOSIS — M2550 Pain in unspecified joint: Secondary | ICD-10-CM

## 2020-10-19 DIAGNOSIS — C4359 Malignant melanoma of other part of trunk: Secondary | ICD-10-CM | POA: Diagnosis present

## 2020-10-19 LAB — COMPREHENSIVE METABOLIC PANEL
ALT: 31 U/L (ref 0–44)
AST: 27 U/L (ref 15–41)
Albumin: 4.3 g/dL (ref 3.5–5.0)
Alkaline Phosphatase: 84 U/L (ref 38–126)
Anion gap: 9 (ref 5–15)
BUN: 33 mg/dL — ABNORMAL HIGH (ref 8–23)
CO2: 24 mmol/L (ref 22–32)
Calcium: 9.4 mg/dL (ref 8.9–10.3)
Chloride: 104 mmol/L (ref 98–111)
Creatinine, Ser: 1.27 mg/dL — ABNORMAL HIGH (ref 0.61–1.24)
GFR, Estimated: >60 mL/min (ref >60)
Glucose, Bld: 142 mg/dL — ABNORMAL HIGH (ref 70–99)
Potassium: 4.4 mmol/L (ref 3.5–5.1)
Sodium: 137 mmol/L (ref 135–145)
Total Bilirubin: 0.4 mg/dL (ref 0.3–1.2)
Total Protein: 7.6 g/dL (ref 6.5–8.1)

## 2020-10-19 LAB — CBC WITH DIFFERENTIAL/PLATELET
Abs Immature Granulocytes: 0.12 10*3/uL — ABNORMAL HIGH (ref 0.00–0.07)
Basophils Absolute: 0 10*3/uL (ref 0.0–0.1)
Basophils Relative: 1 %
Eosinophils Absolute: 0.2 10*3/uL (ref 0.0–0.5)
Eosinophils Relative: 3 %
HCT: 36.7 % — ABNORMAL LOW (ref 39.0–52.0)
Hemoglobin: 12 g/dL — ABNORMAL LOW (ref 13.0–17.0)
Immature Granulocytes: 2 %
Lymphocytes Relative: 19 %
Lymphs Abs: 1 10*3/uL (ref 0.7–4.0)
MCH: 29 pg (ref 26.0–34.0)
MCHC: 32.7 g/dL (ref 30.0–36.0)
MCV: 88.6 fL (ref 80.0–100.0)
Monocytes Absolute: 0.5 10*3/uL (ref 0.1–1.0)
Monocytes Relative: 9 %
Neutro Abs: 3.2 10*3/uL (ref 1.7–7.7)
Neutrophils Relative %: 66 %
Platelets: 249 10*3/uL (ref 150–400)
RBC: 4.14 MIL/uL — ABNORMAL LOW (ref 4.22–5.81)
RDW: 13.4 % (ref 11.5–15.5)
WBC: 5 10*3/uL (ref 4.0–10.5)
nRBC: 0 % (ref 0.0–0.2)

## 2020-10-19 LAB — TSH: TSH: 3.579 u[IU]/mL (ref 0.350–4.500)

## 2020-10-19 LAB — LACTATE DEHYDROGENASE: LDH: 136 U/L (ref 98–192)

## 2020-10-19 LAB — T4, FREE: Free T4: 0.74 ng/dL (ref 0.61–1.12)

## 2020-10-19 MED ORDER — HEPARIN SOD (PORK) LOCK FLUSH 100 UNIT/ML IV SOLN
INTRAVENOUS | Status: AC
  Filled 2020-10-19: qty 5

## 2020-10-19 MED ORDER — HEPARIN SOD (PORK) LOCK FLUSH 100 UNIT/ML IV SOLN
500.0000 [IU] | Freq: Once | INTRAVENOUS | Status: DC
  Filled 2020-10-19: qty 5

## 2020-10-19 MED ORDER — SODIUM CHLORIDE 0.9 % IV SOLN
240.0000 mg | Freq: Once | INTRAVENOUS | Status: AC
  Administered 2020-10-19: 240 mg via INTRAVENOUS
  Filled 2020-10-19: qty 24

## 2020-10-19 MED ORDER — HEPARIN SOD (PORK) LOCK FLUSH 100 UNIT/ML IV SOLN
500.0000 [IU] | Freq: Once | INTRAVENOUS | Status: AC | PRN
Start: 2020-10-19 — End: 2020-10-19
  Administered 2020-10-19: 500 [IU]
  Filled 2020-10-19: qty 5

## 2020-10-19 MED ORDER — SODIUM CHLORIDE 0.9 % IV SOLN
Freq: Once | INTRAVENOUS | Status: AC
  Filled 2020-10-19: qty 250

## 2020-10-19 MED ORDER — SODIUM CHLORIDE 0.9% FLUSH
10.0000 mL | Freq: Once | INTRAVENOUS | Status: AC
  Administered 2020-10-19: 10 mL via INTRAVENOUS
  Filled 2020-10-19: qty 10

## 2020-10-19 NOTE — Patient Instructions (Signed)
[facility] [place] [facility]  Discharge Instructions: Thank you for choosing [facility] to provide your oncology and hematology care.  If you have a lab appointment with the Cancer Center, please go directly to the Cancer Center and check in at the registration area.  Wear comfortable clothing and clothing appropriate for easy access to any Portacath or PICC line.   We strive to give you quality time with your provider. You may need to reschedule your appointment if you arrive late (15 or more minutes).  Arriving late affects you and other patients whose appointments are after yours.  Also, if you miss three or more appointments without notifying the office, you may be dismissed from the clinic at the provider's discretion.      For prescription refill requests, have your pharmacy contact our office and allow 72 hours for refills to be completed.    Today you received the following chemotherapy and/or immunotherapy agents Opdivo      To help prevent nausea and vomiting after your treatment, we encourage you to take your nausea medication as directed.  BELOW ARE SYMPTOMS THAT SHOULD BE REPORTED IMMEDIATELY: *FEVER GREATER THAN 100.4 F (38 C) OR HIGHER *CHILLS OR SWEATING *NAUSEA AND VOMITING THAT IS NOT CONTROLLED WITH YOUR NAUSEA MEDICATION *UNUSUAL SHORTNESS OF BREATH *UNUSUAL BRUISING OR BLEEDING *URINARY PROBLEMS (pain or burning when urinating, or frequent urination) *BOWEL PROBLEMS (unusual diarrhea, constipation, pain near the anus) TENDERNESS IN MOUTH AND THROAT WITH OR WITHOUT PRESENCE OF ULCERS (sore throat, sores in mouth, or a toothache) UNUSUAL RASH, SWELLING OR PAIN  UNUSUAL VAGINAL DISCHARGE OR ITCHING   Items with * indicate a potential emergency and should be followed up as soon as possible or go to the Emergency Department if any problems should occur.  Please show the CHEMOTHERAPY ALERT CARD or IMMUNOTHERAPY ALERT CARD at check-in to  the Emergency Department and triage nurse.  Should you have questions after your visit or need to cancel or reschedule your appointment, please contact [facility] [place] [facility]  [phone] and follow the prompts.  Office hours are 8:00 a.m. to 4:30 p.m. Monday - Friday. Please note that voicemails left after 4:00 p.m. may not be returned until the following business day.  We are closed weekends and major holidays. You have access to a nurse at all times for urgent questions. Please call the main number to the clinic [phone] and follow the prompts.  For any non-urgent questions, you may also contact your provider using MyChart. We now offer e-Visits for anyone 18 and older to request care online for non-urgent symptoms. For details visit [email or web address].   Also download the MyChart app! Go to the app store, search "MyChart", open the app, select [facility], and log in with your MyChart username and password.  Due to Covid, a mask is required upon entering the hospital/clinic. If you do not have a mask, one will be given to you upon arrival. For doctor visits, patients may have 1 support person aged 18 or older with them. For treatment visits, patients cannot have anyone with them due to current Covid guidelines and our immunocompromised population.  

## 2020-10-19 NOTE — Progress Notes (Signed)
Blood return noted with port access. Per Dr. Tasia Catchings, Cathflo not needed at this time.

## 2020-10-19 NOTE — Progress Notes (Signed)
Pt here for follow up. No new concerns voiced.   

## 2020-10-19 NOTE — Progress Notes (Signed)
Hematology/Oncology follow up  note [facility] Telephone:([phone] [phone]   Patient Care Team: Mechele Claude, FNP as PCP - General (Family Medicine) [name], MD as Consulting Physician (Oncology)  REFERRING PROVIDER: [facility]  CHIEF COMPLAINTS/REASON FOR VISIT:  Follow up for melanoma HISTORY OF PRESENTING ILLNESS:   Edward Pitts is a  60 y.o.  male with PMH listed below was seen in consultation at the request of  [facility]  for evaluation of inguinal mass Patient presented to emergency room 3 days ago complaining about left ing uinal mass discomfort. Reports that he has really noticed the mass growing for the past 1 months. He has a history of left lower extremity melanoma in 2011, status post local excision.  Pain was increased with squatting of laxation. He was advised to take Tylenol for pain. Denies any fever, chills, night sweating.  He does feel mild nauseated. Appetite is fair.  He has lost about 10 pounds since earlier this year. 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  Left inguinal and left iliac nodes which are suspicious for involvement.  There are also 2 small nonspecific hypodense lesions within the right liver, nonspecific.  # patient underwent left groin mass resection on 04/16/2019. Resection pathology showed malignant melanoma, replacing a lymph node, with extracapsular extension, peripheral and deep margins involved.  Left inguinal contents, all 7 lymph nodes were negative for melanoma in the lymph nodes.  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  # PDL1 80% TPS  # 04/16/2019. underwent left groin mass resection   07/07/2019  Status post adjuvant radiation and finished radiation  Patient has Mediport placed to facilitate immunotherapy  treatments.  06/29/2020, CT chest abdomen pelvis showed stable postoperative appearance of the left groin.  No evidence of local recurrence.  No evidence of metastatic disease in the chest abdomen or pelvis.  Hepatic steatosis.  Stable subcentimeter fluid attenuation lesion of the lateral right lobe of the liver, likely benign cyst or hemangioma.  Coronary artery disease.  Aortic atherosclerosis   INTERVAL HISTORY Edward Pitts is a 60 y.o. male who has above history reviewed by me today presents for follow up visit for management of inguinal nodal recurrence of melanoma Problems and complaints are listed below:  Tolerates immunotherapy well.  No new complaints today.  Today he has had good blood return from his port. Denies any rash, shortness of breath, diarrhea.  Joint pain has improved.  . :Review of Systems  Constitutional:  Negative for appetite change, chills, fatigue, fever and unexpected weight change.  HENT:   Negative for hearing loss and voice change.   Eyes:  Negative for eye problems and icterus ().  Respiratory:  Negative for chest tightness, cough and shortness of breath.   Cardiovascular:  Negative for chest pain and leg swelling.  Gastrointestinal:  Negative for abdominal distention and abdominal pain.  Endocrine: Negative for hot flashes.  Genitourinary:  Negative for difficulty urinating, dysuria and frequency.   Musculoskeletal:  Positive for arthralgias.       Status post left hip replacement  Skin:  Negative for itching and rash.       Skin hypo-pigmentation on upper extremities, no change  Neurological:  Negative for light-headedness and numbness.  Hematological:  Negative for adenopathy. Does not bruise/bleed easily.  Psychiatric/Behavioral:  Negative for confusion.    MEDICAL  HISTORY:  Past Medical History:  Diagnosis Date   Anemia    iron treatments   Anxiety    Aortic atherosclerosis (HCC)    Arthritis    Cancer of groin ([place]) 2021   left groin,  resected, radiation   Cataract    Complication of anesthesia    PONV   Coronary artery disease    Dizziness of unknown etiology    has led to seizures and passing out.   Family history of adverse reaction to anesthesia    PONV mother   GERD (gastroesophageal reflux disease)    History of complete heart block    PPM placed   Hyperlipidemia    Hypertension    LBBB (left bundle branch block)    Lymphedema of left leg    uses thigh high compression stockings   Melanoma ([place]) 2012   skin cancer, left thigh   OSA on CPAP    PONV (postoperative nausea and vomiting) 04/16/2019   Port-A-Cath in place    RIGHT chest wall   Presence of cardiac pacemaker    [employer]   Seizures ([place])    still has episodes of dizziness. last event 1 month ago (march 2022) and will pass out. takes clonazepam    SURGICAL HISTORY: Past Surgical History:  Procedure Laterality Date   CT RADIATION THERAPY GUIDE     left groin   dental implant     permanent implant   KNEE SURGERY Left    arthroscopy   LEFT HEART CATH AND CORONARY ANGIOGRAPHY Left 06/29/2017   Procedure: LEFT HEART CATH AND CORONARY ANGIOGRAPHY;  Surgeon: [name], MD;  Location: [place] [facility];  Service: Cardiovascular;  Laterality: Left;   LYMPH NODE DISSECTION Left 04/16/2019   Procedure: Left inguinal Lymph Node Dissection;  Surgeon: [name], MD;  Location: [place];  Service: General;  Laterality: Left;   MELANOMA EXCISION Left 04/16/2019   Procedure: MELANOMA EXCISION LEFT GROIN MASS;  Surgeon: [name], MD;  Location: [place];  Service: General;  Laterality: Left;   MELANOMA EXCISION WITH SENTINEL LYMPH NODE BIOPSY Left 2012   Left calf    PACEMAKER INSERTION N/A 08/26/2018   Procedure: INSERTION PACEMAKER;  Surgeon: [name], MD;  Location: [facility];  Service: Cardiovascular;  Laterality: N/A;   PORTA CATH INSERTION N/A 08/26/2019   Procedure: PORTA CATH INSERTION;  Surgeon: Katha Cabal, MD;   Location: [place] [facility];  Service: Cardiovascular;  Laterality: N/A;   SUPERFICIAL LYMPH NODE BIOPSY / EXCISION Left 2020   lymph nodes removed around left groin melanoma site   TEMPORARY PACEMAKER N/A 08/25/2018   Procedure: TEMPORARY PACEMAKER;  Surgeon: [name], MD;  Location: [place] [facility];  Service: Cardiovascular;  Laterality: N/A;   TOTAL HIP ARTHROPLASTY Left 07/14/2020   Procedure: TOTAL HIP ARTHROPLASTY;  Surgeon: Dereck Leep, MD;  Location: [facility];  Service: Orthopedics;  Laterality: Left;    SOCIAL HISTORY: Social History   Socioeconomic History   Marital status: Married    Spouse name: Vicente Males    Number of children: 7   Years of education: 12   Highest education level: Not on file  Occupational History    Comment: disability  Tobacco Use   Smoking status: Never   Smokeless tobacco: Never  Vaping Use   Vaping Use: Never used  Substance and Sexual Activity   Alcohol use: No   Drug use: No   Sexual activity: Not Currently  Other Topics  Concern   Not on file  Social History Narrative   Lives with  Wife,   Has 2 small dogs   Caffeine use: sodas (2 per day)      Out of work on disability.  Has a walk in shower. No stairs to climb   Oncology treatment ongoing. Uses port a cath for treatment.      pacemaker   Social Determinants of Health   Financial Resource Strain: Not on file  Food Insecurity: Not on file  Transportation Needs: Not on file  Physical Activity: Not on file  Stress: Not on file  Social Connections: Not on file  Intimate Partner Violence: Not on file    FAMILY HISTORY: Family History  Problem Relation Age of Onset   Cancer Paternal Grandmother     ALLERGIES:  is allergic to ibuprofen and nsaids.  MEDICATIONS:  Current Outpatient Medications  Medication Sig Dispense Refill   allopurinol (ZYLOPRIM) 300 MG tablet Take 300 mg by mouth daily.     ASPIRIN 81 PO Take 1 tablet by mouth daily.     atorvastatin  (LIPITOR) 20 MG tablet Take 20 mg by mouth daily.     clonazePAM (KLONOPIN) 0.5 MG tablet 1 tablet in the morning, 2 in the evening (Patient taking differently: Take 0.5 mg by mouth 2 (two) times daily. 1 tablet in the morning, 2 in the evening) 90 tablet 3   hydrocortisone 2.5 % ointment Apply topically 2 (two) times daily.     ketoconazole (NIZORAL) 2 % cream Apply 1 application topically daily.     lidocaine-prilocaine (EMLA) cream Apply 1 application topically as needed. Apply small amount of cream to port site approx 1-2 hours prior to appointment. 30 g 2   lisinopril (ZESTRIL) 20 MG tablet Take 20 mg by mouth daily.     metoprolol succinate (TOPROL-XL) 25 MG 24 hr tablet Take 25 mg by mouth daily.      Multiple Vitamin (MULTIVITAMIN WITH MINERALS) TABS tablet Take 1 tablet by mouth daily. Centrum Silver     omeprazole (PRILOSEC) 20 MG capsule TAKE 1 CAPSULE BY MOUTH EVERY DAY 30 capsule 1   ondansetron (ZOFRAN) 4 MG tablet TAKE 1 TABLET BY MOUTH EVERY 8 HOURS AS NEEDED FOR NAUSEA AND VOMITING 90 tablet 1   oxyCODONE (OXY IR/ROXICODONE) 5 MG immediate release tablet Take 1 tablet (5 mg total) by mouth every 4 (four) hours as needed for moderate pain (pain score 4-6). (Patient not taking: No sig reported) 30 tablet 0   No current facility-administered medications for this visit.     PHYSICAL EXAMINATION: ECOG PERFORMANCE STATUS: 1 - Symptomatic but completely ambulatory Vitals:   10/19/20 1008  BP: 121/74  Pulse: 84  Resp: 18  Temp: 98.1 F (36.7 C)   Filed Weights   10/19/20 1008  Weight: 235 lb 8 oz (106.8 kg)    Physical Exam Constitutional:      General: He is not in acute distress.    Comments: Patient ambulates independently  HENT:     Head: Normocephalic and atraumatic.  Eyes:     General: No scleral icterus.    Pupils: Pupils are equal, round, and reactive to light.  Cardiovascular:     Rate and Rhythm: Normal rate and regular rhythm.     Heart sounds: Normal  heart sounds.  Pulmonary:     Effort: Pulmonary effort is normal. No respiratory distress.     Breath sounds: No wheezing.  Abdominal:  General: Bowel sounds are normal. There is no distension.     Palpations: Abdomen is soft. There is no mass.     Tenderness: There is no abdominal tenderness.     Comments:  History left groin mass resection and radiation.   Musculoskeletal:        General: No deformity. Normal range of motion.     Cervical back: Normal range of motion and neck supple.     Comments: Left lower extremity edema Left hip replacement  Skin:    General: Skin is warm and dry.  Neurological:     Mental Status: He is alert and oriented to person, place, and time. Mental status is at baseline.     Cranial Nerves: No cranial nerve deficit.     Coordination: Coordination normal.  Psychiatric:        Mood and Affect: Mood normal.   LABORATORY DATA:  I have reviewed the data as listed Lab Results  Component Value Date   WBC 5.0 10/19/2020   HGB 12.0 (L) 10/19/2020   HCT 36.7 (L) 10/19/2020   MCV 88.6 10/19/2020   PLT 249 10/19/2020   Recent Labs    11/27/19 0833 12/11/19 0854 12/25/19 0905 01/08/20 0851 09/21/20 0804 10/05/20 0831 10/19/20 0915  NA 140 139 140   < > 137 137 137  K 4.1 3.8 4.4   < > 4.3 4.2 4.4  CL 106 104 106   < > 105 108 104  CO2 $Re'24 27 27   'FCb$ < > 24 20* 24  GLUCOSE 125* 118* 109*   < > 132* 138* 142*  BUN 20 25* 20   < > 31* 28* 33*  CREATININE 1.10 1.16 1.22   < > 1.14 1.17 1.27*  CALCIUM 9.0 9.1 9.1   < > 9.0 9.2 9.4  GFRNONAA >60 >60 >60   < > >60 >60 >60  GFRAA >60 >60 >60  --   --   --   --   PROT 7.1 7.3 7.0   < > 6.9 7.2 7.6  ALBUMIN 4.0 4.2 4.1   < > 4.0 4.0 4.3  AST $Re'29 28 26   'dtP$ < > 24 40 27  ALT 40 39 32   < > 30 45* 31  ALKPHOS 89 80 80   < > 89 89 84  BILITOT 0.6 0.8 0.9   < > 0.5 0.3 0.4   < > = values in this interval not displayed.    Iron/TIBC/Ferritin/ %Sat    Component Value Date/Time   IRON 85 10/02/2019 0841    TIBC 342 10/02/2019 0841   FERRITIN 61 10/02/2019 0841   IRONPCTSAT 25 10/02/2019 0841      RADIOGRAPHIC STUDIES: I have personally reviewed the radiological images as listed and agreed with the findings in the report. DG Fluoro Guide CV Line Right  Result Date: 10/05/2020 INDICATION: Right IJ port catheter, no blood return EXAM: FLUOROSCOPIC RIGHT IJ POWER PORT CATHETER INJECTION MEDICATIONS: NONE. ANESTHESIA/SEDATION: NONE. FLUOROSCOPY TIME:  Fluoroscopy Time: 0 minutes 48 seconds (15 mGy). COMPLICATIONS: None immediate. PROCEDURE: Informed written consent was obtained from the patient after a thorough discussion of the procedural risks, benefits and alternatives. All questions were addressed. Maximal Sterile Barrier Technique was utilized including caps, mask, sterile gowns, sterile gloves, sterile drape, hand hygiene and skin antiseptic. A timeout was performed prior to the initiation of the procedure. Under sterile conditions, the existing accessed right IJ power port catheter was injected with contrast. Fluoroscopic  imaging performed. Port catheter is intact and patent. Tip in the SVC RA junction. At the catheter tip, contrast does not flow freely from the tip compatible with a small amount of catheter tip nonocclusive thrombus or fibrin sheath. This likely accounts for the lack of blood return. Catheter flushed with saline in the appropriate volume of heparin. Needle removed. IMPRESSION: Trace amount of thrombus or fibrin sheath at the port catheter tip. Electronically Signed   By: Jerilynn Mages.  Shick M.D.   On: 10/05/2020 12:31        ASSESSMENT & PLAN:  1. Malignant melanoma of overlapping sites ([place])   2. Encounter for antineoplastic immunotherapy   3. Port-A-Cath in place   4. Vitiligo   5. Anemia, unspecified type   6. Arthralgia, unspecified joint    #Left inguinal nodal recurrence of melanoma.  Status post resection and adjuvant radiation. Stage IV Recurrent, now in NED Labs reviewed  and discussed with patient .  Proceed with nivolumab today .  CT was rescheduled to tomorrow per patient's preference.  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 Arthralgia has improved.  # left forearm skin lesion, resolved.  #Port-A-Cath in place, dye study was obtained.  There was trace amount of thrombus/fibrin sheath.  At the tip.  Patient was scheduled to have Cathflo today.  He had a good Mediport blood return today.  I will hold off Cathflo  #Vitiligo, stable #Normocytic anemia, hemoglobin continues to improve to 12.  Follow-up 2 weeks We spent sufficient time to discuss many aspect of care, questions were answered to patient's satisfaction.   [name], MD, PhD Hematology Oncology [facility] Pager- [phone] 10/19/2020

## 2020-10-20 ENCOUNTER — Other Ambulatory Visit: Payer: Self-pay

## 2020-10-20 ENCOUNTER — Ambulatory Visit
Admission: RE | Admit: 2020-10-20 | Discharge: 2020-10-20 | Disposition: A | Discharge status: Home / Self Care | Source: Ambulatory Visit | Attending: Oncology | Admitting: Oncology

## 2020-10-20 DIAGNOSIS — C438 Malignant melanoma of overlapping sites of skin: Secondary | ICD-10-CM | POA: Insufficient documentation

## 2020-10-26 ENCOUNTER — Other Ambulatory Visit: Payer: Self-pay | Admitting: Oncology

## 2020-10-28 ENCOUNTER — Encounter: Payer: Self-pay | Admitting: Oncology

## 2020-11-02 ENCOUNTER — Inpatient Hospital Stay

## 2020-11-02 ENCOUNTER — Inpatient Hospital Stay: Attending: Oncology

## 2020-11-02 ENCOUNTER — Encounter: Payer: Self-pay | Admitting: Oncology

## 2020-11-02 ENCOUNTER — Inpatient Hospital Stay (HOSPITAL_BASED_OUTPATIENT_CLINIC_OR_DEPARTMENT_OTHER): Admitting: Oncology

## 2020-11-02 VITALS — BP 114/52 | HR 65 | Temp 97.9°F | Resp 18 | Wt 232.8 lb

## 2020-11-02 DIAGNOSIS — D649 Anemia, unspecified: Secondary | ICD-10-CM

## 2020-11-02 DIAGNOSIS — Z96642 Presence of left artificial hip joint: Secondary | ICD-10-CM

## 2020-11-02 DIAGNOSIS — Z95828 Presence of other vascular implants and grafts: Secondary | ICD-10-CM

## 2020-11-02 DIAGNOSIS — R7989 Other specified abnormal findings of blood chemistry: Secondary | ICD-10-CM

## 2020-11-02 DIAGNOSIS — C438 Malignant melanoma of overlapping sites of skin: Secondary | ICD-10-CM

## 2020-11-02 DIAGNOSIS — C4359 Malignant melanoma of other part of trunk: Secondary | ICD-10-CM | POA: Diagnosis present

## 2020-11-02 DIAGNOSIS — L80 Vitiligo: Secondary | ICD-10-CM

## 2020-11-02 DIAGNOSIS — Z5112 Encounter for antineoplastic immunotherapy: Secondary | ICD-10-CM | POA: Diagnosis present

## 2020-11-02 DIAGNOSIS — Z79899 Other long term (current) drug therapy: Secondary | ICD-10-CM | POA: Diagnosis not present

## 2020-11-02 LAB — COMPREHENSIVE METABOLIC PANEL
ALT: 45 U/L — ABNORMAL HIGH (ref 0–44)
AST: 29 U/L (ref 15–41)
Albumin: 4.3 g/dL (ref 3.5–5.0)
Alkaline Phosphatase: 90 U/L (ref 38–126)
Anion gap: 7 (ref 5–15)
BUN: 30 mg/dL — ABNORMAL HIGH (ref 8–23)
CO2: 25 mmol/L (ref 22–32)
Calcium: 9 mg/dL (ref 8.9–10.3)
Chloride: 105 mmol/L (ref 98–111)
Creatinine, Ser: 1.31 mg/dL — ABNORMAL HIGH (ref 0.61–1.24)
GFR, Estimated: >60 mL/min (ref >60)
Glucose, Bld: 104 mg/dL — ABNORMAL HIGH (ref 70–99)
Potassium: 4.3 mmol/L (ref 3.5–5.1)
Sodium: 137 mmol/L (ref 135–145)
Total Bilirubin: 0.4 mg/dL (ref 0.3–1.2)
Total Protein: 7.3 g/dL (ref 6.5–8.1)

## 2020-11-02 LAB — CBC WITH DIFFERENTIAL/PLATELET
Abs Immature Granulocytes: 0.04 10*3/uL (ref 0.00–0.07)
Basophils Absolute: 0 10*3/uL (ref 0.0–0.1)
Basophils Relative: 1 %
Eosinophils Absolute: 0.2 10*3/uL (ref 0.0–0.5)
Eosinophils Relative: 4 %
HCT: 33.5 % — ABNORMAL LOW (ref 39.0–52.0)
Hemoglobin: 11.2 g/dL — ABNORMAL LOW (ref 13.0–17.0)
Immature Granulocytes: 1 %
Lymphocytes Relative: 22 %
Lymphs Abs: 0.9 10*3/uL (ref 0.7–4.0)
MCH: 29.5 pg (ref 26.0–34.0)
MCHC: 33.4 g/dL (ref 30.0–36.0)
MCV: 88.2 fL (ref 80.0–100.0)
Monocytes Absolute: 0.5 10*3/uL (ref 0.1–1.0)
Monocytes Relative: 11 %
Neutro Abs: 2.7 10*3/uL (ref 1.7–7.7)
Neutrophils Relative %: 61 %
Platelets: 192 10*3/uL (ref 150–400)
RBC: 3.8 MIL/uL — ABNORMAL LOW (ref 4.22–5.81)
RDW: 14.2 % (ref 11.5–15.5)
WBC: 4.3 10*3/uL (ref 4.0–10.5)
nRBC: 0 % (ref 0.0–0.2)

## 2020-11-02 LAB — T4, FREE: Free T4: 0.88 ng/dL (ref 0.61–1.12)

## 2020-11-02 LAB — LACTATE DEHYDROGENASE: LDH: 128 U/L (ref 98–192)

## 2020-11-02 LAB — TSH: TSH: 3.031 u[IU]/mL (ref 0.350–4.500)

## 2020-11-02 MED ORDER — SODIUM CHLORIDE 0.9 % IV SOLN
Freq: Once | INTRAVENOUS | Status: AC
Start: 2020-11-02 — End: 2020-11-02
  Filled 2020-11-02: qty 250

## 2020-11-02 MED ORDER — HEPARIN SOD (PORK) LOCK FLUSH 100 UNIT/ML IV SOLN
500.0000 [IU] | Freq: Once | INTRAVENOUS | Status: AC
  Administered 2020-11-02: 500 [IU] via INTRAVENOUS
  Filled 2020-11-02: qty 5

## 2020-11-02 MED ORDER — SODIUM CHLORIDE 0.9% FLUSH
10.0000 mL | Freq: Once | INTRAVENOUS | Status: AC
  Administered 2020-11-02: 10 mL via INTRAVENOUS
  Filled 2020-11-02: qty 10

## 2020-11-02 MED ORDER — HEPARIN SOD (PORK) LOCK FLUSH 100 UNIT/ML IV SOLN
500.0000 [IU] | Freq: Once | INTRAVENOUS | Status: DC | PRN
  Filled 2020-11-02: qty 5

## 2020-11-02 MED ORDER — SODIUM CHLORIDE 0.9 % IV SOLN
240.0000 mg | Freq: Once | INTRAVENOUS | Status: AC
  Administered 2020-11-02: 240 mg via INTRAVENOUS
  Filled 2020-11-02: qty 24

## 2020-11-02 MED ORDER — HEPARIN SOD (PORK) LOCK FLUSH 100 UNIT/ML IV SOLN
INTRAVENOUS | Status: AC
  Filled 2020-11-02: qty 5

## 2020-11-02 NOTE — Patient Instructions (Signed)
[facility]   Discharge Instructions: Thank you for choosing [facility] to provide your oncology and hematology care.  If you have a lab appointment with the [facility], please go directly to the [facility] and check in at the registration area.  Wear comfortable clothing and clothing appropriate for easy access to any Portacath or PICC line.   We strive to give you quality time with your provider. You may need to reschedule your appointment if you arrive late (15 or more minutes).  Arriving late affects you and other patients whose appointments are after yours.  Also, if you miss three or more appointments without notifying the office, you may be dismissed from the clinic at the provider's discretion.      For prescription refill requests, have your pharmacy contact our office and allow 72 hours for refills to be completed.    Today you received the following chemotherapy and/or immunotherapy agents - Nivolumab      To help prevent nausea and vomiting after your treatment, we encourage you to take your nausea medication as directed.  BELOW ARE SYMPTOMS THAT SHOULD BE REPORTED IMMEDIATELY: *FEVER GREATER THAN 100.4 F (38 C) OR HIGHER *CHILLS OR SWEATING *NAUSEA AND VOMITING THAT IS NOT CONTROLLED WITH YOUR NAUSEA MEDICATION *UNUSUAL SHORTNESS OF BREATH *UNUSUAL BRUISING OR BLEEDING *URINARY PROBLEMS (pain or burning when urinating, or frequent urination) *BOWEL PROBLEMS (unusual diarrhea, constipation, pain near the anus) TENDERNESS IN MOUTH AND THROAT WITH OR WITHOUT PRESENCE OF ULCERS (sore throat, sores in mouth, or a toothache) UNUSUAL RASH, SWELLING OR PAIN  UNUSUAL VAGINAL DISCHARGE OR ITCHING   Items with * indicate a potential emergency and should be followed up as soon as possible or go to the Emergency Department if any problems should occur.  Please show the CHEMOTHERAPY ALERT CARD or IMMUNOTHERAPY ALERT CARD at  check-in to the Emergency Department and triage nurse.  Should you have questions after your visit or need to cancel or reschedule your appointment, please contact [facility]  [phone] and follow the prompts.  Office hours are 8:00 a.m. to 4:30 p.m. Monday - Friday. Please note that voicemails left after 4:00 p.m. may not be returned until the following business day.  We are closed weekends and major holidays. You have access to a nurse at all times for urgent questions. Please call the main number to the clinic [phone] and follow the prompts.  For any non-urgent questions, you may also contact your provider using MyChart. We now offer e-Visits for anyone 72 and older to request care online for non-urgent symptoms. For details visit [email or web address].   Also download the MyChart app! Go to the app store, search "MyChart", open the app, select [facility], and log in with your MyChart username and password.  Due to Covid, a mask is required upon entering the hospital/clinic. If you do not have a mask, one will be given to you upon arrival. For doctor visits, patients may have 1 support person aged 20 or older with them. For treatment visits, patients cannot have anyone with them due to current Covid guidelines and our immunocompromised population.   Nivolumab injection What is this medication? NIVOLUMAB (nye VOL ue mab) is a monoclonal antibody. It treats certain types of cancer. Some of the cancers treated are colon cancer, head and neck cancer,Hodgkin lymphoma, lung cancer, and melanoma. This medicine may be used for other purposes; ask your health care provider orpharmacist if  you have questions. COMMON BRAND NAME(S): Opdivo What should I tell my care team before I take this medication? They need to know if you have any of these conditions: autoimmune diseases like Crohn's disease, ulcerative colitis, or lupus have had or planning to have an  allogeneic stem cell transplant (uses someone else's stem cells) history of chest radiation history of organ transplant nervous system problems like myasthenia gravis or Guillain-Barre syndrome an unusual or allergic reaction to nivolumab, other medicines, foods, dyes, or preservatives pregnant or trying to get pregnant breast-feeding How should I use this medication? This medicine is for infusion into a vein. It is given by a health careprofessional in a hospital or clinic setting. A special MedGuide will be given to you before each treatment. Be sure to readthis information carefully each time. Talk to your pediatrician regarding the use of this medicine in children. While this drug may be prescribed for children as young as 12 years for selectedconditions, precautions do apply. Overdosage: If you think you have taken too much of this medicine contact apoison control center or emergency room at once. NOTE: This medicine is only for you. Do not share this medicine with others. What if I miss a dose? It is important not to miss your dose. Call your doctor or health careprofessional if you are unable to keep an appointment. What may interact with this medication? Interactions have not been studied. This list may not describe all possible interactions. Give your health care provider a list of all the medicines, herbs, non-prescription drugs, or dietary supplements you use. Also tell them if you smoke, drink alcohol, or use illegaldrugs. Some items may interact with your medicine. What should I watch for while using this medication? This drug may make you feel generally unwell. Continue your course of treatmenteven though you feel ill unless your doctor tells you to stop. You may need blood work done while you are taking this medicine. Do not become pregnant while taking this medicine or for 5 months after stopping it. Women should inform their doctor if they wish to become pregnant or think they  might be pregnant. There is a potential for serious side effects to an unborn child. Talk to your health care professional or pharmacist for more information. Do not breast-feed an infant while taking this medicine orfor 5 months after stopping it. What side effects may I notice from receiving this medication? Side effects that you should report to your doctor or health care professionalas soon as possible: allergic reactions like skin rash, itching or hives, swelling of the face, lips, or tongue breathing problems blood in the urine bloody or watery diarrhea or black, tarry stools changes in emotions or moods changes in vision chest pain cough dizziness feeling faint or lightheaded, falls fever, chills headache with fever, neck stiffness, confusion, loss of memory, sensitivity to light, hallucination, loss of contact with reality, or seizures joint pain mouth sores redness, blistering, peeling or loosening of the skin, including inside the mouth severe muscle pain or weakness signs and symptoms of high blood sugar such as dizziness; dry mouth; dry skin; fruity breath; nausea; stomach pain; increased hunger or thirst; increased urination signs and symptoms of kidney injury like trouble passing urine or change in the amount of urine signs and symptoms of liver injury like dark yellow or brown urine; general ill feeling or flu-like symptoms; light-colored stools; loss of appetite; nausea; right upper belly pain; unusually weak or tired; yellowing of the eyes or skin swelling  of the ankles, feet, hands trouble passing urine or change in the amount of urine unusually weak or tired weight gain or loss Side effects that usually do not require medical attention (report to yourdoctor or health care professional if they continue or are bothersome): bone pain constipation decreased appetite diarrhea muscle pain nausea, vomiting tiredness This list may not describe all possible side effects.  Call your doctor for medical advice about side effects. You may report side effects to FDA at1-800-FDA-1088. Where should I keep my medication? This drug is given in a hospital or clinic and will not be stored at home. NOTE: This sheet is a summary. It may not cover all possible information. If you have questions about this medicine, talk to your doctor, pharmacist, orhealth care provider.  2022 Elsevier/Gold Standard (2019-07-16 10:08:25)

## 2020-11-02 NOTE — Progress Notes (Signed)
Hematology/Oncology follow up  note [facility] Telephone:(336) [phone] Fax:(336) [phone]   Patient Care Team: Mechele Claude, FNP as PCP - General (Family Medicine) [name], MD as Consulting Physician (Oncology)  REFERRING PROVIDER: Mechele Claude, FNP  CHIEF COMPLAINTS/REASON FOR VISIT:  Follow up for melanoma HISTORY OF PRESENTING ILLNESS:   Edward Pitts is a  60 y.o.  male with PMH listed below was seen in consultation at the request of  Mechele Claude, FNP  for evaluation of inguinal mass Patient presented to emergency room 3 days ago complaining about left ing uinal mass discomfort. Reports that he has really noticed the mass growing for the past 1 months. He has a history of left lower extremity melanoma in 2011, status post local excision.  Pain was increased with squatting of laxation. He was advised to take Tylenol for pain. Denies any fever, chills, night sweating.  He does feel mild nauseated. Appetite is fair.  He has lost about 10 pounds since earlier this year. 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  Left inguinal and left iliac nodes which are suspicious for involvement.  There are also 2 small nonspecific hypodense lesions within the right liver, nonspecific.  # patient underwent left groin mass resection on 04/16/2019. Resection pathology showed malignant melanoma, replacing a lymph node, with extracapsular extension, peripheral and deep margins involved.  Left inguinal contents, all 7 lymph nodes were negative for melanoma in the lymph nodes.  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  # PDL1 80% TPS  # 04/16/2019. underwent left groin mass resection   07/07/2019  Status post adjuvant radiation and finished radiation  Patient has Mediport placed to facilitate immunotherapy  treatments.  06/29/2020, CT chest abdomen pelvis showed stable postoperative appearance of the left groin.  No evidence of local recurrence.  No evidence of metastatic disease in the chest abdomen or pelvis.  Hepatic steatosis.  Stable subcentimeter fluid attenuation lesion of the lateral right lobe of the liver, likely benign cyst or hemangioma.  Coronary artery disease.  Aortic atherosclerosis   INTERVAL HISTORY Edward Pitts is a 60 y.o. male who has above history reviewed by me today presents for follow up visit for management of inguinal nodal recurrence of melanoma Problems and complaints are listed below:  Patient tolerates treatment very well No new complaints. Left hip pain has improved pain   . :Review of Systems  Constitutional:  Negative for appetite change, chills, fatigue, fever and unexpected weight change.  HENT:   Negative for hearing loss and voice change.   Eyes:  Negative for eye problems and icterus ().  Respiratory:  Negative for chest tightness, cough and shortness of breath.   Cardiovascular:  Negative for chest pain and leg swelling.  Gastrointestinal:  Negative for abdominal distention and abdominal pain.  Endocrine: Negative for hot flashes.  Genitourinary:  Negative for difficulty urinating, dysuria and frequency.   Musculoskeletal:  Positive for arthralgias.       Status post left hip replacement  Skin:  Negative for itching and rash.       Skin hypo-pigmentation on upper extremities, no change  Neurological:  Negative for light-headedness and numbness.  Hematological:  Negative for adenopathy. Does not bruise/bleed easily.  Psychiatric/Behavioral:  Negative for confusion.    MEDICAL HISTORY:  Past Medical History:  Diagnosis Date   Anemia  iron treatments   Anxiety    Aortic atherosclerosis (HCC)    Arthritis    Cancer of groin ([place]) 2021   left groin, resected, radiation   Cataract    Complication of anesthesia    PONV   Coronary artery  disease    Dizziness of unknown etiology    has led to seizures and passing out.   Family history of adverse reaction to anesthesia    PONV mother   GERD (gastroesophageal reflux disease)    History of complete heart block    PPM placed   Hyperlipidemia    Hypertension    LBBB (left bundle branch block)    Lymphedema of left leg    uses thigh high compression stockings   Melanoma ([place]) 2012   skin cancer, left thigh   OSA on CPAP    PONV (postoperative nausea and vomiting) 04/16/2019   Port-A-Cath in place    RIGHT chest wall   Presence of cardiac pacemaker    [employer]   Seizures ([place])    still has episodes of dizziness. last event 1 month ago (march 2022) and will pass out. takes clonazepam    SURGICAL HISTORY: Past Surgical History:  Procedure Laterality Date   CT RADIATION THERAPY GUIDE     left groin   dental implant     permanent implant   KNEE SURGERY Left    arthroscopy   LEFT HEART CATH AND CORONARY ANGIOGRAPHY Left 06/29/2017   Procedure: LEFT HEART CATH AND CORONARY ANGIOGRAPHY;  Surgeon: [name], MD;  Location: [place] [facility];  Service: Cardiovascular;  Laterality: Left;   LYMPH NODE DISSECTION Left 04/16/2019   Procedure: Left inguinal Lymph Node Dissection;  Surgeon: [name], MD;  Location: [place];  Service: General;  Laterality: Left;   MELANOMA EXCISION Left 04/16/2019   Procedure: MELANOMA EXCISION LEFT GROIN MASS;  Surgeon: [name], MD;  Location: [place];  Service: General;  Laterality: Left;   MELANOMA EXCISION WITH SENTINEL LYMPH NODE BIOPSY Left 2012   Left calf    PACEMAKER INSERTION N/A 08/26/2018   Procedure: INSERTION PACEMAKER;  Surgeon: [name], MD;  Location: [facility];  Service: Cardiovascular;  Laterality: N/A;   PORTA CATH INSERTION N/A 08/26/2019   Procedure: PORTA CATH INSERTION;  Surgeon: Katha Cabal, MD;  Location: [place] [facility];  Service: Cardiovascular;  Laterality: N/A;   SUPERFICIAL LYMPH  NODE BIOPSY / EXCISION Left 2020   lymph nodes removed around left groin melanoma site   TEMPORARY PACEMAKER N/A 08/25/2018   Procedure: TEMPORARY PACEMAKER;  Surgeon: [name], MD;  Location: [place] [facility];  Service: Cardiovascular;  Laterality: N/A;   TOTAL HIP ARTHROPLASTY Left 07/14/2020   Procedure: TOTAL HIP ARTHROPLASTY;  Surgeon: Dereck Leep, MD;  Location: [facility];  Service: Orthopedics;  Laterality: Left;    SOCIAL HISTORY: Social History   Socioeconomic History   Marital status: Married    Spouse name: Vicente Males    Number of children: 7   Years of education: 12   Highest education level: Not on file  Occupational History    Comment: disability  Tobacco Use   Smoking status: Never   Smokeless tobacco: Never  Vaping Use   Vaping Use: Never used  Substance and Sexual Activity   Alcohol use: No   Drug use: No   Sexual activity: Not Currently  Other Topics Concern   Not on file  Social History Narrative   Lives with  Wife,   Has 2 small dogs   Caffeine use: sodas (2 per day)      Out of work on disability.  Has a walk in shower. No stairs to climb   Oncology treatment ongoing. Uses port a cath for treatment.      pacemaker   Social Determinants of Health   Financial Resource Strain: Not on file  Food Insecurity: Not on file  Transportation Needs: Not on file  Physical Activity: Not on file  Stress: Not on file  Social Connections: Not on file  Intimate Partner Violence: Not on file    FAMILY HISTORY: Family History  Problem Relation Age of Onset   Cancer Paternal Grandmother     ALLERGIES:  is allergic to ibuprofen and nsaids.  MEDICATIONS:  Current Outpatient Medications  Medication Sig Dispense Refill   allopurinol (ZYLOPRIM) 300 MG tablet Take 300 mg by mouth daily.     ASPIRIN 81 PO Take 1 tablet by mouth daily.     atorvastatin (LIPITOR) 20 MG tablet Take 20 mg by mouth daily.     clonazePAM (KLONOPIN) 0.5 MG tablet 1 tablet in  the morning, 2 in the evening (Patient taking differently: Take 0.5 mg by mouth 2 (two) times daily. 1 tablet in the morning, 2 in the evening) 90 tablet 3   hydrocortisone 2.5 % ointment Apply topically 2 (two) times daily.     ketoconazole (NIZORAL) 2 % cream Apply 1 application topically daily.     lidocaine-prilocaine (EMLA) cream Apply 1 application topically as needed. Apply small amount of cream to port site approx 1-2 hours prior to appointment. 30 g 2   lisinopril (ZESTRIL) 20 MG tablet Take 20 mg by mouth daily.     metoprolol succinate (TOPROL-XL) 25 MG 24 hr tablet Take 25 mg by mouth daily.      Multiple Vitamin (MULTIVITAMIN WITH MINERALS) TABS tablet Take 1 tablet by mouth daily. Centrum Silver     omeprazole (PRILOSEC) 20 MG capsule TAKE 1 CAPSULE BY MOUTH EVERY DAY 30 capsule 1   ondansetron (ZOFRAN) 4 MG tablet TAKE 1 TABLET BY MOUTH EVERY 8 HOURS AS NEEDED FOR NAUSEA AND VOMITING 90 tablet 1   oxyCODONE (OXY IR/ROXICODONE) 5 MG immediate release tablet Take 1 tablet (5 mg total) by mouth every 4 (four) hours as needed for moderate pain (pain score 4-6). (Patient not taking: No sig reported) 30 tablet 0   No current facility-administered medications for this visit.     PHYSICAL EXAMINATION: ECOG PERFORMANCE STATUS: 1 - Symptomatic but completely ambulatory Vitals:   11/02/20 1047  BP: (!) 114/52  Pulse: 65  Resp: 18  Temp: 97.9 F (36.6 C)   Filed Weights   11/02/20 1047  Weight: 232 lb 12.8 oz (105.6 kg)    Physical Exam Constitutional:      General: He is not in acute distress.    Comments: Patient ambulates independently  HENT:     Head: Normocephalic and atraumatic.  Eyes:     General: No scleral icterus.    Pupils: Pupils are equal, round, and reactive to light.  Cardiovascular:     Rate and Rhythm: Normal rate and regular rhythm.     Heart sounds: Normal heart sounds.  Pulmonary:     Effort: Pulmonary effort is normal. No respiratory distress.      Breath sounds: No wheezing.  Abdominal:     General: Bowel sounds are normal. There is no distension.     Palpations:  Abdomen is soft. There is no mass.     Tenderness: There is no abdominal tenderness.     Comments:  History left groin mass resection and radiation.   Musculoskeletal:        General: No deformity. Normal range of motion.     Cervical back: Normal range of motion and neck supple.     Comments: Left lower extremity edema Left hip replacement  Skin:    General: Skin is warm and dry.  Neurological:     Mental Status: He is alert and oriented to person, place, and time. Mental status is at baseline.     Cranial Nerves: No cranial nerve deficit.     Coordination: Coordination normal.  Psychiatric:        Mood and Affect: Mood normal.   LABORATORY DATA:  I have reviewed the data as listed Lab Results  Component Value Date   WBC 4.3 11/02/2020   HGB 11.2 (L) 11/02/2020   HCT 33.5 (L) 11/02/2020   MCV 88.2 11/02/2020   PLT 192 11/02/2020   Recent Labs    11/27/19 0833 12/11/19 0854 12/25/19 0905 01/08/20 0851 10/05/20 0831 10/19/20 0915 11/02/20 0948  NA 140 139 140   < > 137 137 137  K 4.1 3.8 4.4   < > 4.2 4.4 4.3  CL 106 104 106   < > 108 104 105  CO2 $Re'24 27 27   'ddk$ < > 20* 24 25  GLUCOSE 125* 118* 109*   < > 138* 142* 104*  BUN 20 25* 20   < > 28* 33* 30*  CREATININE 1.10 1.16 1.22   < > 1.17 1.27* 1.31*  CALCIUM 9.0 9.1 9.1   < > 9.2 9.4 9.0  GFRNONAA >60 >60 >60   < > >60 >60 >60  GFRAA >60 >60 >60  --   --   --   --   PROT 7.1 7.3 7.0   < > 7.2 7.6 7.3  ALBUMIN 4.0 4.2 4.1   < > 4.0 4.3 4.3  AST $Re'29 28 26   'aog$ < > 40 27 29  ALT 40 39 32   < > 45* 31 45*  ALKPHOS 89 80 80   < > 89 84 90  BILITOT 0.6 0.8 0.9   < > 0.3 0.4 0.4   < > = values in this interval not displayed.    Iron/TIBC/Ferritin/ %Sat    Component Value Date/Time   IRON 85 10/02/2019 0841   TIBC 342 10/02/2019 0841   FERRITIN 61 10/02/2019 0841   IRONPCTSAT 25 10/02/2019 0841       RADIOGRAPHIC STUDIES: I have personally reviewed the radiological images as listed and agreed with the findings in the report. DG Fluoro Guide CV Line Right  Result Date: 10/05/2020 INDICATION: Right IJ port catheter, no blood return EXAM: FLUOROSCOPIC RIGHT IJ POWER PORT CATHETER INJECTION MEDICATIONS: NONE. ANESTHESIA/SEDATION: NONE. FLUOROSCOPY TIME:  Fluoroscopy Time: 0 minutes 48 seconds (15 mGy). COMPLICATIONS: None immediate. PROCEDURE: Informed written consent was obtained from the patient after a thorough discussion of the procedural risks, benefits and alternatives. All questions were addressed. Maximal Sterile Barrier Technique was utilized including caps, mask, sterile gowns, sterile gloves, sterile drape, hand hygiene and skin antiseptic. A timeout was performed prior to the initiation of the procedure. Under sterile conditions, the existing accessed right IJ power port catheter was injected with contrast. Fluoroscopic imaging performed. Port catheter is intact and patent. Tip in the SVC RA junction.  At the catheter tip, contrast does not flow freely from the tip compatible with a small amount of catheter tip nonocclusive thrombus or fibrin sheath. This likely accounts for the lack of blood return. Catheter flushed with saline in the appropriate volume of heparin. Needle removed. IMPRESSION: Trace amount of thrombus or fibrin sheath at the port catheter tip. Electronically Signed   By: Jerilynn Mages.  Shick M.D.   On: 10/05/2020 12:31   CT CHEST ABDOMEN PELVIS WO CONTRAST  Result Date: 10/21/2020 CLINICAL DATA:  History of melanoma diagnosed in 2012 with Mets to left inguinal area in October 2020 status post surgical resection in January 2021. EXAM: CT CHEST, ABDOMEN AND PELVIS WITHOUT CONTRAST TECHNIQUE: Multidetector CT imaging of the chest, abdomen and pelvis was performed following the standard protocol without IV contrast. COMPARISON:  Multiple prior is including most recent CT June 29, 2020.  FINDINGS: CT CHEST FINDINGS Cardiovascular: Right chest wall Port-A-Cath with tip at the superior cavoatrial junction. Left chest multi lead pacemaker. Aortic atherosclerosis without aneurysmal dilation. Normal caliber central pulmonary arteries. Left coronary artery calcifications. No significant pericardial effusion/thickening. Mediastinum/Nodes: No discrete thyroid nodule. No pathologically enlarged mediastinal, hilar or axillary lymph nodes, noting limited sensitivity for the detection of hilar adenopathy on this noncontrast study. The trachea and esophagus are grossly unremarkable. Lungs/Pleura: No suspicious pulmonary nodules or masses. No pleural effusion. No pneumothorax. Musculoskeletal: No aggressive lytic or blastic lesion of bone. No chest wall mass. CT ABDOMEN PELVIS FINDINGS Hepatobiliary: Hepatic steatosis, otherwise unremarkable noncontrast appearance of the hepatic parenchyma. Gallbladder is unremarkable. No biliary ductal dilation. Pancreas: Within normal limits. Spleen: Within normal limits. Adrenals/Urinary Tract: Bilateral adrenal glands are unremarkable. Right lower pole renal cyst. No hydronephrosis. No nephrolithiasis. Urinary bladder is unremarkable for degree of distension. Stomach/Bowel: Stomach is within normal limits. Colonic diverticulosis without findings of acute diverticulitis. No evidence of bowel wall thickening, distention, or inflammatory changes. Vascular/Lymphatic: Aortic atherosclerosis without aneurysmal dilation. No pathologically enlarged abdominal or pelvic lymph nodes. Reproductive: Prostate is unremarkable. Other: Similar findings of left inguinal lymph node resection with overlying skin thickening and subcutaneous stranding in keeping with radiation therapy. Musculoskeletal: Changes of interval left hip arthroplasty. Right hip degenerative change. Degenerative changes spine. No aggressive lytic or blastic lesion of bone. IMPRESSION: 1. Stable postoperative/post  radiation appearance of the left groin. No evidence of local recurrence or metastatic disease within the chest, abdomen, or pelvis. 2. Hepatic steatosis. 3. Colonic diverticulosis without findings of acute diverticulitis. 4. Changes of interval left hip arthroplasty. 5.  Aortic Atherosclerosis (ICD10-I70.0). Electronically Signed   By: Dahlia Bailiff MD   On: 10/21/2020 11:19        ASSESSMENT & PLAN:  1. Malignant melanoma of overlapping sites ([place])   2. Encounter for antineoplastic immunotherapy   3. Port-A-Cath in place   4. Vitiligo   5. History of left hip replacement   6. Elevated serum creatinine    #Left inguinal nodal recurrence of melanoma.  Status post resection and adjuvant radiation. Stage IV Recurrent, now in NED Labs reviewed and discussed with patient Proceed with nivolumab today 10/20/2020, CT chest abdomen pelvis showed stable postoperative/radiation appearance of the left groin.  No evidence of local recurrence/metastatic disease within the chest abdomen/pelvis.  Fatty liver disease.  Diverticulosis without evidence of typhlitis.  Aortic atherosclerosis   # Left hip and knee arthritis-status post hip replacement. Arthralgia has improved.  #Port-A-Cath in place, dye study was obtained.  There was trace amount of thrombus/fibrin sheath at the tip.   #  Elevated creatinine.  GFR is above 60.  Encourage patient to increase oral hydration.  #Vitiligo, stable #Normocytic anemia, hemoglobin continues to improve to 12.  Follow-up 2 weeks We spent sufficient time to discuss many aspect of care, questions were answered to patient's satisfaction.   [name], MD, PhD Hematology Oncology [facility] at [facility] Pager- [phone] 11/02/2020

## 2020-11-02 NOTE — Progress Notes (Signed)
Pt received nivolumab in clinic today. Tolerated well. No complaints at d/c.

## 2020-11-02 NOTE — Progress Notes (Signed)
Pt here for follow up. No new concerns voiced.   

## 2020-11-16 ENCOUNTER — Inpatient Hospital Stay (HOSPITAL_BASED_OUTPATIENT_CLINIC_OR_DEPARTMENT_OTHER): Admitting: Oncology

## 2020-11-16 ENCOUNTER — Encounter: Payer: Self-pay | Admitting: Oncology

## 2020-11-16 ENCOUNTER — Inpatient Hospital Stay

## 2020-11-16 VITALS — BP 129/48 | HR 72 | Temp 99.0°F | Resp 16 | Ht 67.0 in | Wt 240.0 lb

## 2020-11-16 DIAGNOSIS — C438 Malignant melanoma of overlapping sites of skin: Secondary | ICD-10-CM

## 2020-11-16 DIAGNOSIS — L80 Vitiligo: Secondary | ICD-10-CM

## 2020-11-16 DIAGNOSIS — Z5112 Encounter for antineoplastic immunotherapy: Secondary | ICD-10-CM

## 2020-11-16 DIAGNOSIS — D649 Anemia, unspecified: Secondary | ICD-10-CM | POA: Diagnosis not present

## 2020-11-16 LAB — CBC WITH DIFFERENTIAL/PLATELET
Abs Immature Granulocytes: 0.05 K/uL (ref 0.00–0.07)
Basophils Absolute: 0 K/uL (ref 0.0–0.1)
Basophils Relative: 0 %
Eosinophils Absolute: 0.2 K/uL (ref 0.0–0.5)
Eosinophils Relative: 5 %
HCT: 33.4 % — ABNORMAL LOW (ref 39.0–52.0)
Hemoglobin: 11.1 g/dL — ABNORMAL LOW (ref 13.0–17.0)
Immature Granulocytes: 1 %
Lymphocytes Relative: 22 %
Lymphs Abs: 1 K/uL (ref 0.7–4.0)
MCH: 29.8 pg (ref 26.0–34.0)
MCHC: 33.2 g/dL (ref 30.0–36.0)
MCV: 89.5 fL (ref 80.0–100.0)
Monocytes Absolute: 0.4 K/uL (ref 0.1–1.0)
Monocytes Relative: 9 %
Neutro Abs: 2.8 K/uL (ref 1.7–7.7)
Neutrophils Relative %: 63 %
Platelets: 184 K/uL (ref 150–400)
RBC: 3.73 MIL/uL — ABNORMAL LOW (ref 4.22–5.81)
RDW: 14.1 % (ref 11.5–15.5)
WBC: 4.5 K/uL (ref 4.0–10.5)
nRBC: 0 % (ref 0.0–0.2)

## 2020-11-16 LAB — COMPREHENSIVE METABOLIC PANEL
ALT: 27 U/L (ref 0–44)
AST: 27 U/L (ref 15–41)
Albumin: 3.9 g/dL (ref 3.5–5.0)
Alkaline Phosphatase: 94 U/L (ref 38–126)
Anion gap: 7 (ref 5–15)
BUN: 27 mg/dL — ABNORMAL HIGH (ref 8–23)
CO2: 24 mmol/L (ref 22–32)
Calcium: 9.1 mg/dL (ref 8.9–10.3)
Chloride: 105 mmol/L (ref 98–111)
Creatinine, Ser: 1.32 mg/dL — ABNORMAL HIGH (ref 0.61–1.24)
GFR, Estimated: >60 mL/min (ref >60)
Glucose, Bld: 150 mg/dL — ABNORMAL HIGH (ref 70–99)
Potassium: 4.2 mmol/L (ref 3.5–5.1)
Sodium: 136 mmol/L (ref 135–145)
Total Bilirubin: 0.5 mg/dL (ref 0.3–1.2)
Total Protein: 7.2 g/dL (ref 6.5–8.1)

## 2020-11-16 MED ORDER — HEPARIN SOD (PORK) LOCK FLUSH 100 UNIT/ML IV SOLN
INTRAVENOUS | Status: AC
  Administered 2020-11-16: 500 [IU]
  Filled 2020-11-16: qty 5

## 2020-11-16 MED ORDER — SODIUM CHLORIDE 0.9 % IV SOLN
Freq: Once | INTRAVENOUS | Status: AC
  Filled 2020-11-16: qty 250

## 2020-11-16 MED ORDER — HEPARIN SOD (PORK) LOCK FLUSH 100 UNIT/ML IV SOLN
500.0000 [IU] | Freq: Once | INTRAVENOUS | Status: AC | PRN
Start: 2020-11-16 — End: 2020-11-16
  Filled 2020-11-16: qty 5

## 2020-11-16 MED ORDER — SODIUM CHLORIDE 0.9 % IV SOLN
240.0000 mg | Freq: Once | INTRAVENOUS | Status: AC
  Administered 2020-11-16: 240 mg via INTRAVENOUS
  Filled 2020-11-16: qty 24

## 2020-11-16 NOTE — Patient Instructions (Signed)
[facility] [place] [facility]  Discharge Instructions: Thank you for choosing [facility] to provide your oncology and hematology care.  If you have a lab appointment with the Cancer Center, please go directly to the Cancer Center and check in at the registration area.  Wear comfortable clothing and clothing appropriate for easy access to any Portacath or PICC line.   We strive to give you quality time with your provider. You may need to reschedule your appointment if you arrive late (15 or more minutes).  Arriving late affects you and other patients whose appointments are after yours.  Also, if you miss three or more appointments without notifying the office, you may be dismissed from the clinic at the provider's discretion.      For prescription refill requests, have your pharmacy contact our office and allow 72 hours for refills to be completed.    Today you received the following chemotherapy and/or immunotherapy agents Nivolumab       To help prevent nausea and vomiting after your treatment, we encourage you to take your nausea medication as directed.  BELOW ARE SYMPTOMS THAT SHOULD BE REPORTED IMMEDIATELY: *FEVER GREATER THAN 100.4 F (38 C) OR HIGHER *CHILLS OR SWEATING *NAUSEA AND VOMITING THAT IS NOT CONTROLLED WITH YOUR NAUSEA MEDICATION *UNUSUAL SHORTNESS OF BREATH *UNUSUAL BRUISING OR BLEEDING *URINARY PROBLEMS (pain or burning when urinating, or frequent urination) *BOWEL PROBLEMS (unusual diarrhea, constipation, pain near the anus) TENDERNESS IN MOUTH AND THROAT WITH OR WITHOUT PRESENCE OF ULCERS (sore throat, sores in mouth, or a toothache) UNUSUAL RASH, SWELLING OR PAIN  UNUSUAL VAGINAL DISCHARGE OR ITCHING   Items with * indicate a potential emergency and should be followed up as soon as possible or go to the Emergency Department if any problems should occur.  Please show the CHEMOTHERAPY ALERT CARD or IMMUNOTHERAPY ALERT CARD at check-in  to the Emergency Department and triage nurse.  Should you have questions after your visit or need to cancel or reschedule your appointment, please contact [facility] [place] [facility]  [phone] and follow the prompts.  Office hours are 8:00 a.m. to 4:30 p.m. Monday - Friday. Please note that voicemails left after 4:00 p.m. may not be returned until the following business day.  We are closed weekends and major holidays. You have access to a nurse at all times for urgent questions. Please call the main number to the clinic [phone] and follow the prompts.  For any non-urgent questions, you may also contact your provider using MyChart. We now offer e-Visits for anyone 18 and older to request care online for non-urgent symptoms. For details visit [email or web address].   Also download the MyChart app! Go to the app store, search "MyChart", open the app, select [facility], and log in with your MyChart username and password.  Due to Covid, a mask is required upon entering the hospital/clinic. If you do not have a mask, one will be given to you upon arrival. For doctor visits, patients may have 1 support person aged 18 or older with them. For treatment visits, patients cannot have anyone with them due to current Covid guidelines and our immunocompromised population.  

## 2020-11-16 NOTE — Progress Notes (Signed)
Hematology/Oncology follow up  note [facility] [phone] [phone]   Patient Care Team: Mechele Claude, FNP as PCP - General (Family Medicine) [name], MD as Consulting Physician (Oncology)  REFERRING PROVIDER: Mechele Claude, FNP  CHIEF COMPLAINTS/REASON FOR VISIT:  Follow up for melanoma HISTORY OF PRESENTING ILLNESS:   Edward Pitts is a  60 y.o.  male with PMH listed below was seen in consultation at the request of  Mechele Claude, FNP  for evaluation of inguinal mass Patient presented to emergency room 3 days ago complaining about left ing uinal mass discomfort. Reports that he has really noticed the mass growing for the past 1 months. He has a history of left lower extremity melanoma in 2011, status post local excision.  Pain was increased with squatting of laxation. He was advised to take Tylenol for pain. Denies any fever, chills, night sweating.  He does feel mild nauseated. Appetite is fair.  He has lost about 10 pounds since earlier this year. 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  Left inguinal and left iliac nodes which are suspicious for involvement.  There are also 2 small nonspecific hypodense lesions within the right liver, nonspecific.  # patient underwent left groin mass resection on 04/16/2019. Resection pathology showed malignant melanoma, replacing a lymph node, with extracapsular extension, peripheral and deep margins involved.  Left inguinal contents, all 7 lymph nodes were negative for melanoma in the lymph nodes.  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  # PDL1 80% TPS  # 04/16/2019. underwent left groin mass resection   07/07/2019  Status post adjuvant radiation and finished radiation  Patient has Mediport placed to facilitate immunotherapy  treatments.  06/29/2020, CT chest abdomen pelvis showed stable postoperative appearance of the left groin.  No evidence of local recurrence.  No evidence of metastatic disease in the chest abdomen or pelvis.  Hepatic steatosis.  Stable subcentimeter fluid attenuation lesion of the lateral right lobe of the liver, likely benign cyst or hemangioma.  Coronary artery disease.  Aortic atherosclerosis  10/20/2020, CT chest abdomen pelvis showed stable postoperative/radiation appearance of the left groin.  No evidence of local recurrence/metastatic disease within the chest abdomen/pelvis.  Fatty liver disease.  Diverticulosis without evidence of typhlitis.  Aortic atherosclerosis  INTERVAL HISTORY Edward Pitts is a 60 y.o. male who has above history reviewed by me today presents for follow up visit for management of inguinal nodal recurrence of melanoma Problems and complaints are listed below:  Patient tolerates treatment very well Denies any new complaints. Chronic left lower extremity lymphedema, he wears compression stocking.  Symptoms are stable.   . :Review of Systems  Constitutional:  Negative for appetite change, chills, fatigue, fever and unexpected weight change.  HENT:   Negative for hearing loss and voice change.   Eyes:  Negative for eye problems and icterus ().  Respiratory:  Negative for chest tightness, cough and shortness of breath.   Cardiovascular:  Negative for chest pain and leg swelling.  Gastrointestinal:  Negative for abdominal distention and abdominal pain.  Endocrine: Negative for hot flashes.  Genitourinary:  Negative for difficulty urinating, dysuria and frequency.   Musculoskeletal:        Status post left hip replacement  Skin:  Negative for itching and rash.       Skin hypo-pigmentation on upper extremities, no change  Neurological:  Negative for light-headedness and numbness.  Hematological:  Negative for adenopathy. Does not bruise/bleed easily.   Psychiatric/Behavioral:  Negative for confusion.    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   Family history of adverse reaction to anesthesia    PONV mother   GERD (gastroesophageal reflux disease)    History of complete heart block    PPM placed   Hyperlipidemia    Hypertension    LBBB (left bundle branch block)    Lymphedema of left leg    uses thigh high compression stockings   Melanoma ([place]) 2012   skin cancer, left thigh   OSA on CPAP    PONV (postoperative nausea and vomiting) 04/16/2019   Port-A-Cath in place    RIGHT chest wall   Presence of cardiac pacemaker    [employer]   Seizures ([place])    still has episodes of dizziness. last event 1 month ago (march 2022) and will pass out. takes clonazepam    SURGICAL HISTORY: Past Surgical History:  Procedure Laterality Date   CT RADIATION THERAPY GUIDE     left groin   dental implant     permanent implant   KNEE SURGERY Left    arthroscopy   LEFT HEART CATH AND CORONARY ANGIOGRAPHY Left 06/29/2017   Procedure: LEFT HEART CATH AND CORONARY ANGIOGRAPHY;  Surgeon: [name], MD;  Location: [facility];  Service: Cardiovascular;  Laterality: Left;   LYMPH NODE DISSECTION Left 04/16/2019   Procedure: Left inguinal Lymph Node Dissection;  Surgeon: [name], MD;  Location: [place];  Service: General;  Laterality: Left;   MELANOMA EXCISION Left 04/16/2019   Procedure: MELANOMA EXCISION LEFT GROIN MASS;  Surgeon: [name], MD;  Location: [place];  Service: General;  Laterality: Left;   MELANOMA EXCISION WITH SENTINEL LYMPH NODE BIOPSY Left 2012   Left calf    PACEMAKER INSERTION N/A 08/26/2018    Procedure: INSERTION PACEMAKER;  Surgeon: [name], MD;  Location: [facility];  Service: Cardiovascular;  Laterality: N/A;   PORTA CATH INSERTION N/A 08/26/2019   Procedure: PORTA CATH INSERTION;  Surgeon: Katha Cabal, MD;  Location: [facility];  Service: Cardiovascular;  Laterality: N/A;   SUPERFICIAL LYMPH NODE BIOPSY / EXCISION Left 2020   lymph nodes removed around left groin melanoma site   TEMPORARY PACEMAKER N/A 08/25/2018   Procedure: TEMPORARY PACEMAKER;  Surgeon: [name], MD;  Location: [facility] [facility];  Service: Cardiovascular;  Laterality: N/A;   TOTAL HIP ARTHROPLASTY Left 07/14/2020   Procedure: TOTAL HIP ARTHROPLASTY;  Surgeon: Dereck Leep, MD;  Location: [facility];  Service: Orthopedics;  Laterality: Left;    SOCIAL HISTORY: Social History   Socioeconomic History   Marital status: Married    Spouse name: Vicente Males    Number of children: 7   Years of education: 12   Highest education level: Not on file  Occupational History    Comment: disability  Tobacco Use   Smoking status: Never   Smokeless tobacco: Never  Vaping Use   Vaping  Use: Never used  Substance and Sexual Activity   Alcohol use: No   Drug use: No   Sexual activity: Not Currently  Other Topics Concern   Not on file  Social History Narrative   Lives with  Wife,   Has 2 small dogs   Caffeine use: sodas (2 per day)      Out of work on disability.  Has a walk in shower. No stairs to climb   Oncology treatment ongoing. Uses port a cath for treatment.      pacemaker   Social Determinants of Health   Financial Resource Strain: Not on file  Food Insecurity: Not on file  Transportation Needs: Not on file  Physical Activity: Not on file  Stress: Not on file  Social Connections: Not on file  Intimate Partner Violence: Not on file    FAMILY HISTORY: Family History  Problem Relation Age of Onset   Cancer Paternal Grandmother     ALLERGIES:  is allergic to  ibuprofen and nsaids.  MEDICATIONS:  Current Outpatient Medications  Medication Sig Dispense Refill   allopurinol (ZYLOPRIM) 300 MG tablet Take 300 mg by mouth daily.     ASPIRIN 81 PO Take 1 tablet by mouth daily.     atorvastatin (LIPITOR) 20 MG tablet Take 20 mg by mouth daily.     clonazePAM (KLONOPIN) 0.5 MG tablet 1 tablet in the morning, 2 in the evening (Patient taking differently: Take 0.5 mg by mouth 2 (two) times daily. 1 tablet in the morning, 2 in the evening) 90 tablet 3   hydrocortisone 2.5 % ointment Apply topically 2 (two) times daily.     ketoconazole (NIZORAL) 2 % cream Apply 1 application topically daily.     lidocaine-prilocaine (EMLA) cream Apply 1 application topically as needed. Apply small amount of cream to port site approx 1-2 hours prior to appointment. 30 g 2   lisinopril (ZESTRIL) 20 MG tablet Take 20 mg by mouth daily.     metoprolol succinate (TOPROL-XL) 25 MG 24 hr tablet Take 25 mg by mouth daily.      Multiple Vitamin (MULTIVITAMIN WITH MINERALS) TABS tablet Take 1 tablet by mouth daily. Centrum Silver     omeprazole (PRILOSEC) 20 MG capsule TAKE 1 CAPSULE BY MOUTH EVERY DAY 30 capsule 1   ondansetron (ZOFRAN) 4 MG tablet TAKE 1 TABLET BY MOUTH EVERY 8 HOURS AS NEEDED FOR NAUSEA AND VOMITING 90 tablet 1   oxyCODONE (OXY IR/ROXICODONE) 5 MG immediate release tablet Take 1 tablet (5 mg total) by mouth every 4 (four) hours as needed for moderate pain (pain score 4-6). (Patient not taking: No sig reported) 30 tablet 0   No current facility-administered medications for this visit.     PHYSICAL EXAMINATION: ECOG PERFORMANCE STATUS: 1 - Symptomatic but completely ambulatory Vitals:   11/16/20 0841  BP: (!) 129/48  Pulse: 72  Resp: 16  Temp: 99 F (37.2 C)  SpO2: 98%   Filed Weights   11/16/20 0841  Weight: 240 lb (108.9 kg)    Physical Exam Constitutional:      General: He is not in acute distress.    Comments: Patient ambulates independently   HENT:     Head: Normocephalic and atraumatic.  Eyes:     General: No scleral icterus.    Pupils: Pupils are equal, round, and reactive to light.  Cardiovascular:     Rate and Rhythm: Normal rate and regular rhythm.     Heart sounds: Normal heart  sounds.  Pulmonary:     Effort: Pulmonary effort is normal. No respiratory distress.     Breath sounds: No wheezing.  Abdominal:     General: Bowel sounds are normal. There is no distension.     Palpations: Abdomen is soft. There is no mass.     Tenderness: There is no abdominal tenderness.     Comments:  History left groin mass resection and radiation.   Musculoskeletal:        General: No deformity. Normal range of motion.     Cervical back: Normal range of motion and neck supple.     Comments: Left lower extremity edema Left hip replacement  Skin:    General: Skin is warm and dry.  Neurological:     Mental Status: He is alert and oriented to person, place, and time. Mental status is at baseline.     Cranial Nerves: No cranial nerve deficit.     Coordination: Coordination normal.  Psychiatric:        Mood and Affect: Mood normal.   LABORATORY DATA:  I have reviewed the data as listed Lab Results  Component Value Date   WBC 4.5 11/16/2020   HGB 11.1 (L) 11/16/2020   HCT 33.4 (L) 11/16/2020   MCV 89.5 11/16/2020   PLT 184 11/16/2020   Recent Labs    11/27/19 0833 12/11/19 0854 12/25/19 0905 01/08/20 0851 10/19/20 0915 11/02/20 0948 11/16/20 0817  NA 140 139 140   < > 137 137 136  K 4.1 3.8 4.4   < > 4.4 4.3 4.2  CL 106 104 106   < > 104 105 105  CO2 $Re'24 27 27   'mTx$ < > $R'24 25 24  'ya$ GLUCOSE 125* 118* 109*   < > 142* 104* 150*  BUN 20 25* 20   < > 33* 30* 27*  CREATININE 1.10 1.16 1.22   < > 1.27* 1.31* 1.32*  CALCIUM 9.0 9.1 9.1   < > 9.4 9.0 9.1  GFRNONAA >60 >60 >60   < > >60 >60 >60  GFRAA >60 >60 >60  --   --   --   --   PROT 7.1 7.3 7.0   < > 7.6 7.3 7.2  ALBUMIN 4.0 4.2 4.1   < > 4.3 4.3 3.9  AST $Re'29 28 26   'sEc$ < > $R'27  29 27  'Wf$ ALT 40 39 32   < > 31 45* 27  ALKPHOS 89 80 80   < > 84 90 94  BILITOT 0.6 0.8 0.9   < > 0.4 0.4 0.5   < > = values in this interval not displayed.    Iron/TIBC/Ferritin/ %Sat    Component Value Date/Time   IRON 85 10/02/2019 0841   TIBC 342 10/02/2019 0841   FERRITIN 61 10/02/2019 0841   IRONPCTSAT 25 10/02/2019 0841      RADIOGRAPHIC STUDIES: I have personally reviewed the radiological images as listed and agreed with the findings in the report. DG Fluoro Guide CV Line Right  Result Date: 10/05/2020 INDICATION: Right IJ port catheter, no blood return EXAM: FLUOROSCOPIC RIGHT IJ POWER PORT CATHETER INJECTION MEDICATIONS: NONE. ANESTHESIA/SEDATION: NONE. FLUOROSCOPY TIME:  Fluoroscopy Time: 0 minutes 48 seconds (15 mGy). COMPLICATIONS: None immediate. PROCEDURE: Informed written consent was obtained from the patient after a thorough discussion of the procedural risks, benefits and alternatives. All questions were addressed. Maximal Sterile Barrier Technique was utilized including caps, mask, sterile gowns, sterile gloves, sterile drape, hand hygiene and  skin antiseptic. A timeout was performed prior to the initiation of the procedure. Under sterile conditions, the existing accessed right IJ power port catheter was injected with contrast. Fluoroscopic imaging performed. Port catheter is intact and patent. Tip in the SVC RA junction. At the catheter tip, contrast does not flow freely from the tip compatible with a small amount of catheter tip nonocclusive thrombus or fibrin sheath. This likely accounts for the lack of blood return. Catheter flushed with saline in the appropriate volume of heparin. Needle removed. IMPRESSION: Trace amount of thrombus or fibrin sheath at the port catheter tip. Electronically Signed   By: Jerilynn Mages.  Shick M.D.   On: 10/05/2020 12:31   CT CHEST ABDOMEN PELVIS WO CONTRAST  Result Date: 10/21/2020 CLINICAL DATA:  History of melanoma diagnosed in 2012 with Mets to  left inguinal area in October 2020 status post surgical resection in January 2021. EXAM: CT CHEST, ABDOMEN AND PELVIS WITHOUT CONTRAST TECHNIQUE: Multidetector CT imaging of the chest, abdomen and pelvis was performed following the standard protocol without IV contrast. COMPARISON:  Multiple prior is including most recent CT June 29, 2020. FINDINGS: CT CHEST FINDINGS Cardiovascular: Right chest wall Port-A-Cath with tip at the superior cavoatrial junction. Left chest multi lead pacemaker. Aortic atherosclerosis without aneurysmal dilation. Normal caliber central pulmonary arteries. Left coronary artery calcifications. No significant pericardial effusion/thickening. Mediastinum/Nodes: No discrete thyroid nodule. No pathologically enlarged mediastinal, hilar or axillary lymph nodes, noting limited sensitivity for the detection of hilar adenopathy on this noncontrast study. The trachea and esophagus are grossly unremarkable. Lungs/Pleura: No suspicious pulmonary nodules or masses. No pleural effusion. No pneumothorax. Musculoskeletal: No aggressive lytic or blastic lesion of bone. No chest wall mass. CT ABDOMEN PELVIS FINDINGS Hepatobiliary: Hepatic steatosis, otherwise unremarkable noncontrast appearance of the hepatic parenchyma. Gallbladder is unremarkable. No biliary ductal dilation. Pancreas: Within normal limits. Spleen: Within normal limits. Adrenals/Urinary Tract: Bilateral adrenal glands are unremarkable. Right lower pole renal cyst. No hydronephrosis. No nephrolithiasis. Urinary bladder is unremarkable for degree of distension. Stomach/Bowel: Stomach is within normal limits. Colonic diverticulosis without findings of acute diverticulitis. No evidence of bowel wall thickening, distention, or inflammatory changes. Vascular/Lymphatic: Aortic atherosclerosis without aneurysmal dilation. No pathologically enlarged abdominal or pelvic lymph nodes. Reproductive: Prostate is unremarkable. Other: Similar findings of  left inguinal lymph node resection with overlying skin thickening and subcutaneous stranding in keeping with radiation therapy. Musculoskeletal: Changes of interval left hip arthroplasty. Right hip degenerative change. Degenerative changes spine. No aggressive lytic or blastic lesion of bone. IMPRESSION: 1. Stable postoperative/post radiation appearance of the left groin. No evidence of local recurrence or metastatic disease within the chest, abdomen, or pelvis. 2. Hepatic steatosis. 3. Colonic diverticulosis without findings of acute diverticulitis. 4. Changes of interval left hip arthroplasty. 5.  Aortic Atherosclerosis (ICD10-I70.0). Electronically Signed   By: Dahlia Bailiff MD   On: 10/21/2020 11:19        ASSESSMENT & PLAN:  1. Malignant melanoma of overlapping sites ([place])   2. Encounter for antineoplastic immunotherapy   3. Vitiligo   4. Anemia, unspecified type    #Left inguinal nodal recurrence of melanoma.  Status post resection and adjuvant radiation. Stage IV Recurrent, now in NED Labs reviewed and discussed with patient. Proceed with nivolumab treatments today.    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 Arthralgia has improved.  #Elevated creatinine.  GFR is above 60.  Encourage oral hydration.  #Vitiligo, stable #Normocytic anemia, hemoglobin is at 11.1.  Monitor. Follow-up 2 weeks  We spent sufficient time to discuss many aspect of care, questions were answered to patient's satisfaction.   [name], MD, PhD Hematology Oncology [facility] at [facility] Pager- [phone] 11/16/2020

## 2020-11-23 ENCOUNTER — Other Ambulatory Visit: Payer: Self-pay | Admitting: Oncology

## 2020-11-30 ENCOUNTER — Encounter: Payer: Self-pay | Admitting: Oncology

## 2020-11-30 ENCOUNTER — Inpatient Hospital Stay (HOSPITAL_BASED_OUTPATIENT_CLINIC_OR_DEPARTMENT_OTHER): Admitting: Oncology

## 2020-11-30 ENCOUNTER — Inpatient Hospital Stay

## 2020-11-30 ENCOUNTER — Inpatient Hospital Stay: Attending: Oncology

## 2020-11-30 ENCOUNTER — Other Ambulatory Visit: Payer: Self-pay

## 2020-11-30 VITALS — BP 135/64 | HR 78 | Temp 97.8°F | Resp 18 | Wt 240.0 lb

## 2020-11-30 DIAGNOSIS — Z79899 Other long term (current) drug therapy: Secondary | ICD-10-CM | POA: Diagnosis not present

## 2020-11-30 DIAGNOSIS — C438 Malignant melanoma of overlapping sites of skin: Secondary | ICD-10-CM

## 2020-11-30 DIAGNOSIS — L80 Vitiligo: Secondary | ICD-10-CM

## 2020-11-30 DIAGNOSIS — D649 Anemia, unspecified: Secondary | ICD-10-CM | POA: Diagnosis not present

## 2020-11-30 DIAGNOSIS — R7989 Other specified abnormal findings of blood chemistry: Secondary | ICD-10-CM | POA: Insufficient documentation

## 2020-11-30 DIAGNOSIS — Z5112 Encounter for antineoplastic immunotherapy: Secondary | ICD-10-CM | POA: Insufficient documentation

## 2020-11-30 DIAGNOSIS — C4359 Malignant melanoma of other part of trunk: Secondary | ICD-10-CM | POA: Diagnosis present

## 2020-11-30 LAB — CBC WITH DIFFERENTIAL/PLATELET
Abs Immature Granulocytes: 0.05 10*3/uL (ref 0.00–0.07)
Basophils Absolute: 0 10*3/uL (ref 0.0–0.1)
Basophils Relative: 0 %
Eosinophils Absolute: 0.2 10*3/uL (ref 0.0–0.5)
Eosinophils Relative: 5 %
HCT: 34 % — ABNORMAL LOW (ref 39.0–52.0)
Hemoglobin: 11.3 g/dL — ABNORMAL LOW (ref 13.0–17.0)
Immature Granulocytes: 1 %
Lymphocytes Relative: 23 %
Lymphs Abs: 1.1 10*3/uL (ref 0.7–4.0)
MCH: 29.7 pg (ref 26.0–34.0)
MCHC: 33.2 g/dL (ref 30.0–36.0)
MCV: 89.5 fL (ref 80.0–100.0)
Monocytes Absolute: 0.4 10*3/uL (ref 0.1–1.0)
Monocytes Relative: 9 %
Neutro Abs: 2.9 10*3/uL (ref 1.7–7.7)
Neutrophils Relative %: 62 %
Platelets: 194 10*3/uL (ref 150–400)
RBC: 3.8 MIL/uL — ABNORMAL LOW (ref 4.22–5.81)
RDW: 14.3 % (ref 11.5–15.5)
WBC: 4.8 10*3/uL (ref 4.0–10.5)
nRBC: 0 % (ref 0.0–0.2)

## 2020-11-30 LAB — COMPREHENSIVE METABOLIC PANEL
ALT: 34 U/L (ref 0–44)
AST: 27 U/L (ref 15–41)
Albumin: 4 g/dL (ref 3.5–5.0)
Alkaline Phosphatase: 90 U/L (ref 38–126)
Anion gap: 7 (ref 5–15)
BUN: 32 mg/dL — ABNORMAL HIGH (ref 8–23)
CO2: 24 mmol/L (ref 22–32)
Calcium: 8.8 mg/dL — ABNORMAL LOW (ref 8.9–10.3)
Chloride: 107 mmol/L (ref 98–111)
Creatinine, Ser: 1.25 mg/dL — ABNORMAL HIGH (ref 0.61–1.24)
GFR, Estimated: >60 mL/min (ref >60)
Glucose, Bld: 133 mg/dL — ABNORMAL HIGH (ref 70–99)
Potassium: 4.1 mmol/L (ref 3.5–5.1)
Sodium: 138 mmol/L (ref 135–145)
Total Bilirubin: 0.3 mg/dL (ref 0.3–1.2)
Total Protein: 7.3 g/dL (ref 6.5–8.1)

## 2020-11-30 MED ORDER — SODIUM CHLORIDE 0.9 % IV SOLN
Freq: Once | INTRAVENOUS | Status: AC
  Filled 2020-11-30: qty 250

## 2020-11-30 MED ORDER — SODIUM CHLORIDE 0.9 % IV SOLN
240.0000 mg | Freq: Once | INTRAVENOUS | Status: AC
  Administered 2020-11-30: 240 mg via INTRAVENOUS
  Filled 2020-11-30: qty 24

## 2020-11-30 MED ORDER — HEPARIN SOD (PORK) LOCK FLUSH 100 UNIT/ML IV SOLN
500.0000 [IU] | Freq: Once | INTRAVENOUS | Status: AC
  Administered 2020-11-30: 500 [IU] via INTRAVENOUS
  Filled 2020-11-30: qty 5

## 2020-11-30 MED ORDER — HEPARIN SOD (PORK) LOCK FLUSH 100 UNIT/ML IV SOLN
500.0000 [IU] | Freq: Once | INTRAVENOUS | Status: DC | PRN
Start: 2020-11-30 — End: 2020-11-30
  Filled 2020-11-30: qty 5

## 2020-11-30 MED ORDER — NYSTATIN 100000 UNIT/GM EX POWD: 1.0000 "application " | Freq: Three times a day (TID) | CUTANEOUS | 2 refills | Status: DC

## 2020-11-30 MED ORDER — SODIUM CHLORIDE 0.9% FLUSH
10.0000 mL | INTRAVENOUS | Status: DC | PRN
  Administered 2020-11-30: 10 mL via INTRAVENOUS
  Filled 2020-11-30: qty 10

## 2020-11-30 NOTE — Progress Notes (Signed)
Pt here for follow up. Pt reports that ever since he had surgery in groin area, he bleeds at times when he showers.

## 2020-11-30 NOTE — Patient Instructions (Signed)
[facility] [place]  Discharge Instructions: Thank you for choosing [facility] to provide your oncology and hematology care.  If you have a lab appointment with the Cancer Center, please go directly to the Cancer Center and check in at the registration area.  Wear comfortable clothing and clothing appropriate for easy access to any Portacath or PICC line.   We strive to give you quality time with your provider. You may need to reschedule your appointment if you arrive late (15 or more minutes).  Arriving late affects you and other patients whose appointments are after yours.  Also, if you miss three or more appointments without notifying the office, you may be dismissed from the clinic at the provider's discretion.      For prescription refill requests, have your pharmacy contact our office and allow 72 hours for refills to be completed.    Today you received the following chemotherapy and/or immunotherapy agents Opdivo      To help prevent nausea and vomiting after your treatment, we encourage you to take your nausea medication as directed.  BELOW ARE SYMPTOMS THAT SHOULD BE REPORTED IMMEDIATELY: *FEVER GREATER THAN 100.4 F (38 C) OR HIGHER *CHILLS OR SWEATING *NAUSEA AND VOMITING THAT IS NOT CONTROLLED WITH YOUR NAUSEA MEDICATION *UNUSUAL SHORTNESS OF BREATH *UNUSUAL BRUISING OR BLEEDING *URINARY PROBLEMS (pain or burning when urinating, or frequent urination) *BOWEL PROBLEMS (unusual diarrhea, constipation, pain near the anus) TENDERNESS IN MOUTH AND THROAT WITH OR WITHOUT PRESENCE OF ULCERS (sore throat, sores in mouth, or a toothache) UNUSUAL RASH, SWELLING OR PAIN  UNUSUAL VAGINAL DISCHARGE OR ITCHING   Items with * indicate a potential emergency and should be followed up as soon as possible or go to the Emergency Department if any problems should occur.  Please show the CHEMOTHERAPY ALERT CARD or IMMUNOTHERAPY ALERT CARD at check-in to  the Emergency Department and triage nurse.  Should you have questions after your visit or need to cancel or reschedule your appointment, please contact [facility]  [phone] and follow the prompts.  Office hours are 8:00 a.m. to 4:30 p.m. Monday - Friday. Please note that voicemails left after 4:00 p.m. may not be returned until the following business day.  We are closed weekends and major holidays. You have access to a nurse at all times for urgent questions. Please call the main number to the clinic [phone] and follow the prompts.  For any non-urgent questions, you may also contact your provider using MyChart. We now offer e-Visits for anyone 18 and older to request care online for non-urgent symptoms. For details visit [email or web address].   Also download the MyChart app! Go to the app store, search "MyChart", open the app, select [facility], and log in with your MyChart username and password.  Due to Covid, a mask is required upon entering the hospital/clinic. If you do not have a mask, one will be given to you upon arrival. For doctor visits, patients may have 1 support person aged 18 or older with them. For treatment visits, patients cannot have anyone with them due to current Covid guidelines and our immunocompromised population.  

## 2020-11-30 NOTE — Progress Notes (Signed)
Hematology/Oncology follow up  note [facility] Telephone:(336) [phone] Fax:(336) [phone]   Patient Care Team: Mechele Claude, FNP as PCP - General (Family Medicine) [name], MD as Consulting Physician (Oncology)  REFERRING PROVIDER: Mechele Claude, FNP  CHIEF COMPLAINTS/REASON FOR VISIT:  Follow up for melanoma HISTORY OF PRESENTING ILLNESS:   Edward Pitts is a  60 y.o.  male with PMH listed below was seen in consultation at the request of  Mechele Claude, FNP  for evaluation of inguinal mass Patient presented to emergency room 3 days ago complaining about left ing uinal mass discomfort. Reports that he has really noticed the mass growing for the past 1 months. He has a history of left lower extremity melanoma in 2011, status post local excision.  Pain was increased with squatting of laxation. He was advised to take Tylenol for pain. Denies any fever, chills, night sweating.  He does feel mild nauseated. Appetite is fair.  He has lost about 10 pounds since earlier this year. 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  Left inguinal and left iliac nodes which are suspicious for involvement.  There are also 2 small nonspecific hypodense lesions within the right liver, nonspecific.  # patient underwent left groin mass resection on 04/16/2019. Resection pathology showed malignant melanoma, replacing a lymph node, with extracapsular extension, peripheral and deep margins involved.  Left inguinal contents, all 7 lymph nodes were negative for melanoma in the lymph nodes.  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  # PDL1 80% TPS  # 04/16/2019. underwent left groin mass resection   07/07/2019  Status post adjuvant radiation and finished radiation  Patient has Mediport placed to facilitate immunotherapy  treatments.  06/29/2020, CT chest abdomen pelvis showed stable postoperative appearance of the left groin.  No evidence of local recurrence.  No evidence of metastatic disease in the chest abdomen or pelvis.  Hepatic steatosis.  Stable subcentimeter fluid attenuation lesion of the lateral right lobe of the liver, likely benign cyst or hemangioma.  Coronary artery disease.  Aortic atherosclerosis  10/20/2020, CT chest abdomen pelvis showed stable postoperative/radiation appearance of the left groin.  No evidence of local recurrence/metastatic disease within the chest abdomen/pelvis.  Fatty liver disease.  Diverticulosis without evidence of typhlitis.  Aortic atherosclerosis  INTERVAL HISTORY Edward Pitts is a 61 y.o. male who has above history reviewed by me today presents for follow up visit for management of inguinal nodal recurrence of melanoma Problems and complaints are listed below:  Patient tolerates treatment very well The only complaint he has today is some groin area bleeding during shower.  Chronic left lower extremity lymphedema, he wears compression stocking.  Symptoms are stable.   . :Review of Systems  Constitutional:  Negative for appetite change, chills, fatigue, fever and unexpected weight change.  HENT:   Negative for hearing loss and voice change.   Eyes:  Negative for eye problems and icterus ().  Respiratory:  Negative for chest tightness, cough and shortness of breath.   Cardiovascular:  Negative for chest pain and leg swelling.  Gastrointestinal:  Negative for abdominal distention and abdominal pain.  Endocrine: Negative for hot flashes.  Genitourinary:  Negative for difficulty urinating, dysuria and frequency.   Musculoskeletal:        Status post left hip replacement  Skin:  Negative for itching and rash.  Skin hypo-pigmentation on upper extremities, no change  Neurological:  Negative for light-headedness and numbness.  Hematological:  Negative for adenopathy.  Does not bruise/bleed easily.  Psychiatric/Behavioral:  Negative for confusion.    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   Family history of adverse reaction to anesthesia    PONV mother   GERD (gastroesophageal reflux disease)    History of complete heart block    PPM placed   Hyperlipidemia    Hypertension    LBBB (left bundle branch block)    Lymphedema of left leg    uses thigh high compression stockings   Melanoma ([place]) 2012   skin cancer, left thigh   OSA on CPAP    PONV (postoperative nausea and vomiting) 04/16/2019   Port-A-Cath in place    RIGHT chest wall   Presence of cardiac pacemaker    [employer]   Seizures ([place])    still has episodes of dizziness. last event 1 month ago (march 2022) and will pass out. takes clonazepam    SURGICAL HISTORY: Past Surgical History:  Procedure Laterality Date   CT RADIATION THERAPY GUIDE     left groin   dental implant     permanent implant   KNEE SURGERY Left    arthroscopy   LEFT HEART CATH AND CORONARY ANGIOGRAPHY Left 06/29/2017   Procedure: LEFT HEART CATH AND CORONARY ANGIOGRAPHY;  Surgeon: [name], MD;  Location: [facility];  Service: Cardiovascular;  Laterality: Left;   LYMPH NODE DISSECTION Left 04/16/2019   Procedure: Left inguinal Lymph Node Dissection;  Surgeon: [name], MD;  Location: [facility];  Service: General;  Laterality: Left;   MELANOMA EXCISION Left 04/16/2019   Procedure: MELANOMA EXCISION LEFT GROIN MASS;  Surgeon: [name], MD;  Location: [facility];  Service: General;  Laterality: Left;   MELANOMA EXCISION WITH SENTINEL LYMPH NODE BIOPSY Left 2012   Left calf     PACEMAKER INSERTION N/A 08/26/2018   Procedure: INSERTION PACEMAKER;  Surgeon: [name], MD;  Location: [facility];  Service: Cardiovascular;  Laterality: N/A;   PORTA CATH INSERTION N/A 08/26/2019   Procedure: PORTA CATH INSERTION;  Surgeon: Katha Cabal, MD;  Location: [facility];  Service: Cardiovascular;  Laterality: N/A;   SUPERFICIAL LYMPH NODE BIOPSY / EXCISION Left 2020   lymph nodes removed around left groin melanoma site   TEMPORARY PACEMAKER N/A 08/25/2018   Procedure: TEMPORARY PACEMAKER;  Surgeon: [name], MD;  Location: [facility];  Service: Cardiovascular;  Laterality: N/A;   TOTAL HIP ARTHROPLASTY Left 07/14/2020   Procedure: TOTAL HIP ARTHROPLASTY;  Surgeon: Dereck Leep, MD;  Location: [facility];  Service: Orthopedics;  Laterality: Left;    SOCIAL HISTORY: Social History   Socioeconomic History   Marital status: Married    Spouse name: Vicente Males    Number of children: 7   Years of education: 12   Highest education level: Not on file  Occupational History    Comment: disability  Tobacco Use   Smoking status: Never   Smokeless  tobacco: Never  Vaping Use   Vaping Use: Never used  Substance and Sexual Activity   Alcohol use: No   Drug use: No   Sexual activity: Not Currently  Other Topics Concern   Not on file  Social History Narrative   Lives with  Wife,   Has 2 small dogs   Caffeine use: sodas (2 per day)      Out of work on disability.  Has a walk in shower. No stairs to climb   Oncology treatment ongoing. Uses port a cath for treatment.      pacemaker   Social Determinants of Health   Financial Resource Strain: Not on file  Food Insecurity: Not on file  Transportation Needs: Not on file  Physical Activity: Not on file  Stress: Not on file  Social Connections: Not on file  Intimate Partner Violence: Not on file    FAMILY HISTORY: Family History  Problem Relation Age of Onset   Cancer Paternal Grandmother      ALLERGIES:  is allergic to ibuprofen and nsaids.  MEDICATIONS:  Current Outpatient Medications  Medication Sig Dispense Refill   allopurinol (ZYLOPRIM) 300 MG tablet Take 300 mg by mouth daily.     ASPIRIN 81 PO Take 1 tablet by mouth daily.     atorvastatin (LIPITOR) 20 MG tablet Take 20 mg by mouth daily.     clonazePAM (KLONOPIN) 0.5 MG tablet 1 tablet in the morning, 2 in the evening (Patient taking differently: Take 0.5 mg by mouth 2 (two) times daily. 1 tablet in the morning, 2 in the evening) 90 tablet 3   hydrocortisone 2.5 % ointment Apply topically 2 (two) times daily.     ketoconazole (NIZORAL) 2 % cream Apply 1 application topically daily.     lidocaine-prilocaine (EMLA) cream Apply 1 application topically as needed. Apply small amount of cream to port site approx 1-2 hours prior to appointment. 30 g 2   lisinopril (ZESTRIL) 20 MG tablet Take 20 mg by mouth daily.     metoprolol succinate (TOPROL-XL) 25 MG 24 hr tablet Take 25 mg by mouth daily.      Multiple Vitamin (MULTIVITAMIN WITH MINERALS) TABS tablet Take 1 tablet by mouth daily. Centrum Silver     nystatin (MYCOSTATIN/NYSTOP) powder Apply 1 application topically 3 (three) times daily. Apply small amount to affected area 3 times a day until healed 60 g 2   omeprazole (PRILOSEC) 20 MG capsule TAKE 1 CAPSULE BY MOUTH EVERY DAY 30 capsule 1   ondansetron (ZOFRAN) 4 MG tablet TAKE 1 TABLET BY MOUTH EVERY 8 HOURS AS NEEDED FOR NAUSEA AND VOMITING 90 tablet 1   oxyCODONE (OXY IR/ROXICODONE) 5 MG immediate release tablet Take 1 tablet (5 mg total) by mouth every 4 (four) hours as needed for moderate pain (pain score 4-6). (Patient not taking: Reported on 11/30/2020) 30 tablet 0   No current facility-administered medications for this visit.     PHYSICAL EXAMINATION: ECOG PERFORMANCE STATUS: 1 - Symptomatic but completely ambulatory Vitals:   11/30/20 1012  BP: 135/64  Pulse: 78  Resp: 18  Temp: 97.8 F (36.6 C)    Filed Weights   11/30/20 1012  Weight: 240 lb (108.9 kg)    Physical Exam Constitutional:      General: He is not in acute distress.    Comments: Patient ambulates independently  HENT:     Head: Normocephalic and atraumatic.  Eyes:     General: No scleral icterus.  Pupils: Pupils are equal, round, and reactive to light.  Cardiovascular:     Rate and Rhythm: Normal rate and regular rhythm.     Heart sounds: Normal heart sounds.  Pulmonary:     Effort: Pulmonary effort is normal. No respiratory distress.     Breath sounds: No wheezing.  Abdominal:     General: Bowel sounds are normal. There is no distension.     Palpations: Abdomen is soft. There is no mass.     Tenderness: There is no abdominal tenderness.     Comments:  History left groin mass resection and radiation.  There is focal tissue swelling/thickening due to lymphedema.  Focal erythematous skin.  No active bleeding   Musculoskeletal:        General: No deformity. Normal range of motion.     Cervical back: Normal range of motion and neck supple.     Comments: Left lower extremity edema Left hip replacement  Skin:    General: Skin is warm and dry.  Neurological:     Mental Status: He is alert and oriented to person, place, and time. Mental status is at baseline.     Cranial Nerves: No cranial nerve deficit.     Coordination: Coordination normal.  Psychiatric:        Mood and Affect: Mood normal.  RN Arvin Collard serves as chaperone during physical examination.  LABORATORY DATA:  I have reviewed the data as listed Lab Results  Component Value Date   WBC 4.8 11/30/2020   HGB 11.3 (L) 11/30/2020   HCT 34.0 (L) 11/30/2020   MCV 89.5 11/30/2020   PLT 194 11/30/2020   Recent Labs    12/11/19 0854 12/25/19 0905 01/08/20 0851 11/02/20 0948 11/16/20 0817 11/30/20 0954  NA 139 140   < > 137 136 138  K 3.8 4.4   < > 4.3 4.2 4.1  CL 104 106   < > 105 105 107  CO2 27 27   < > _0 GLUCOSE 118*  109*   < > 104* 150* 133*  BUN 25* 20   < > 30* 27* 32*  CREATININE 1.16 1.22   < > 1.31* 1.32* 1.25*  CALCIUM 9.1 9.1   < > 9.0 9.1 8.8*  GFRNONAA >60 >60   < > >60 >60 >60  GFRAA >60 >60  --   --   --   --   PROT 7.3 7.0   < > 7.3 7.2 7.3  ALBUMIN 4.2 4.1   < > 4.3 3.9 4.0  AST 28 26   < > _1 ALT 39 32   < > 45* 27 34  ALKPHOS 80 80   < > 90 94 90  BILITOT 0.8 0.9   < > 0.4 0.5 0.3   < > = values in this interval not displayed.    Iron/TIBC/Ferritin/ %Sat    Component Value Date/Time   IRON 85 10/02/2019 0841   TIBC 342 10/02/2019 0841   FERRITIN 61 10/02/2019 0841   IRONPCTSAT 25 10/02/2019 0841      RADIOGRAPHIC STUDIES: I have personally reviewed the radiological images as listed and agreed with the findings in the report. DG Fluoro Guide CV Line Right  Result Date: 10/05/2020 INDICATION: Right IJ port catheter, no blood return EXAM: FLUOROSCOPIC RIGHT IJ POWER PORT CATHETER INJECTION MEDICATIONS: NONE. ANESTHESIA/SEDATION: NONE. FLUOROSCOPY TIME:  Fluoroscopy Time: 0 minutes 48 seconds (15 mGy). COMPLICATIONS: None immediate. PROCEDURE: Informed written  consent was obtained from the patient after a thorough discussion of the procedural risks, benefits and alternatives. All questions were addressed. Maximal Sterile Barrier Technique was utilized including caps, mask, sterile gowns, sterile gloves, sterile drape, hand hygiene and skin antiseptic. A timeout was performed prior to the initiation of the procedure. Under sterile conditions, the existing accessed right IJ power port catheter was injected with contrast. Fluoroscopic imaging performed. Port catheter is intact and patent. Tip in the SVC RA junction. At the catheter tip, contrast does not flow freely from the tip compatible with a small amount of catheter tip nonocclusive thrombus or fibrin sheath. This likely accounts for the lack of blood return. Catheter flushed with saline in the appropriate volume of heparin.  Needle removed. IMPRESSION: Trace amount of thrombus or fibrin sheath at the port catheter tip. Electronically Signed   By: Jerilynn Mages.  Shick M.D.   On: 10/05/2020 12:31   CT CHEST ABDOMEN PELVIS WO CONTRAST  Result Date: 10/21/2020 CLINICAL DATA:  History of melanoma diagnosed in 2012 with Mets to left inguinal area in October 2020 status post surgical resection in January 2021. EXAM: CT CHEST, ABDOMEN AND PELVIS WITHOUT CONTRAST TECHNIQUE: Multidetector CT imaging of the chest, abdomen and pelvis was performed following the standard protocol without IV contrast. COMPARISON:  Multiple prior is including most recent CT June 29, 2020. FINDINGS: CT CHEST FINDINGS Cardiovascular: Right chest wall Port-A-Cath with tip at the superior cavoatrial junction. Left chest multi lead pacemaker. Aortic atherosclerosis without aneurysmal dilation. Normal caliber central pulmonary arteries. Left coronary artery calcifications. No significant pericardial effusion/thickening. Mediastinum/Nodes: No discrete thyroid nodule. No pathologically enlarged mediastinal, hilar or axillary lymph nodes, noting limited sensitivity for the detection of hilar adenopathy on this noncontrast study. The trachea and esophagus are grossly unremarkable. Lungs/Pleura: No suspicious pulmonary nodules or masses. No pleural effusion. No pneumothorax. Musculoskeletal: No aggressive lytic or blastic lesion of bone. No chest wall mass. CT ABDOMEN PELVIS FINDINGS Hepatobiliary: Hepatic steatosis, otherwise unremarkable noncontrast appearance of the hepatic parenchyma. Gallbladder is unremarkable. No biliary ductal dilation. Pancreas: Within normal limits. Spleen: Within normal limits. Adrenals/Urinary Tract: Bilateral adrenal glands are unremarkable. Right lower pole renal cyst. No hydronephrosis. No nephrolithiasis. Urinary bladder is unremarkable for degree of distension. Stomach/Bowel: Stomach is within normal limits. Colonic diverticulosis without findings of  acute diverticulitis. No evidence of bowel wall thickening, distention, or inflammatory changes. Vascular/Lymphatic: Aortic atherosclerosis without aneurysmal dilation. No pathologically enlarged abdominal or pelvic lymph nodes. Reproductive: Prostate is unremarkable. Other: Similar findings of left inguinal lymph node resection with overlying skin thickening and subcutaneous stranding in keeping with radiation therapy. Musculoskeletal: Changes of interval left hip arthroplasty. Right hip degenerative change. Degenerative changes spine. No aggressive lytic or blastic lesion of bone. IMPRESSION: 1. Stable postoperative/post radiation appearance of the left groin. No evidence of local recurrence or metastatic disease within the chest, abdomen, or pelvis. 2. Hepatic steatosis. 3. Colonic diverticulosis without findings of acute diverticulitis. 4. Changes of interval left hip arthroplasty. 5.  Aortic Atherosclerosis (ICD10-I70.0). Electronically Signed   By: Dahlia Bailiff MD   On: 10/21/2020 11:19        ASSESSMENT & PLAN:  1. Malignant melanoma of overlapping sites ([place])   2. Encounter for antineoplastic immunotherapy   3. Vitiligo    #Left inguinal nodal recurrence of melanoma.  Status post resection and adjuvant radiation. Stage IV Recurrent, now in NED Labs reviewed and discussed with patient Proceed with nivolumab treatment today  #Erythematous changes of left inguinal area Probably  due to chronic moisture/post surgery/radiation changes.  Possible candidiasis infection.  Recommend patient to allow air dry of the area after shower.  Also recommend nystatin powder 4 times daily as needed  # Left hip and knee arthritis-status post hip replacement. Arthralgia has improved.  #Elevated creatinine.  GFR is above 60.  Encourage oral hydration.  #Vitiligo, stable #Normocytic anemia, hemoglobin is at 11.3.  Monitor. Follow-up 2 weeks We spent sufficient time to discuss many aspect of care,  questions were answered to patient's satisfaction.   [name], MD, PhD Hematology Oncology [place] at [facility]  11/30/2020

## 2020-12-14 ENCOUNTER — Inpatient Hospital Stay

## 2020-12-14 ENCOUNTER — Other Ambulatory Visit: Payer: Self-pay | Admitting: Neurology

## 2020-12-14 ENCOUNTER — Encounter: Payer: Self-pay | Admitting: Oncology

## 2020-12-14 ENCOUNTER — Inpatient Hospital Stay (HOSPITAL_BASED_OUTPATIENT_CLINIC_OR_DEPARTMENT_OTHER): Admitting: Oncology

## 2020-12-14 VITALS — BP 128/51 | HR 74 | Temp 96.2°F | Resp 17 | Wt 242.8 lb

## 2020-12-14 DIAGNOSIS — C438 Malignant melanoma of overlapping sites of skin: Secondary | ICD-10-CM

## 2020-12-14 DIAGNOSIS — L80 Vitiligo: Secondary | ICD-10-CM

## 2020-12-14 DIAGNOSIS — D649 Anemia, unspecified: Secondary | ICD-10-CM | POA: Diagnosis not present

## 2020-12-14 DIAGNOSIS — Z5112 Encounter for antineoplastic immunotherapy: Secondary | ICD-10-CM | POA: Diagnosis not present

## 2020-12-14 LAB — COMPREHENSIVE METABOLIC PANEL
ALT: 39 U/L (ref 0–44)
AST: 33 U/L (ref 15–41)
Albumin: 4.1 g/dL (ref 3.5–5.0)
Alkaline Phosphatase: 92 U/L (ref 38–126)
Anion gap: 7 (ref 5–15)
BUN: 27 mg/dL — ABNORMAL HIGH (ref 8–23)
CO2: 25 mmol/L (ref 22–32)
Calcium: 9.3 mg/dL (ref 8.9–10.3)
Chloride: 105 mmol/L (ref 98–111)
Creatinine, Ser: 1.1 mg/dL (ref 0.61–1.24)
GFR, Estimated: >60 mL/min (ref >60)
Glucose, Bld: 104 mg/dL — ABNORMAL HIGH (ref 70–99)
Potassium: 4.7 mmol/L (ref 3.5–5.1)
Sodium: 137 mmol/L (ref 135–145)
Total Bilirubin: 0.8 mg/dL (ref 0.3–1.2)
Total Protein: 7.3 g/dL (ref 6.5–8.1)

## 2020-12-14 LAB — CBC WITH DIFFERENTIAL/PLATELET
Abs Immature Granulocytes: 0.05 10*3/uL (ref 0.00–0.07)
Basophils Absolute: 0 10*3/uL (ref 0.0–0.1)
Basophils Relative: 1 %
Eosinophils Absolute: 0.2 10*3/uL (ref 0.0–0.5)
Eosinophils Relative: 5 %
HCT: 33.2 % — ABNORMAL LOW (ref 39.0–52.0)
Hemoglobin: 11.2 g/dL — ABNORMAL LOW (ref 13.0–17.0)
Immature Granulocytes: 1 %
Lymphocytes Relative: 21 %
Lymphs Abs: 1 10*3/uL (ref 0.7–4.0)
MCH: 30.2 pg (ref 26.0–34.0)
MCHC: 33.7 g/dL (ref 30.0–36.0)
MCV: 89.5 fL (ref 80.0–100.0)
Monocytes Absolute: 0.5 10*3/uL (ref 0.1–1.0)
Monocytes Relative: 11 %
Neutro Abs: 2.9 10*3/uL (ref 1.7–7.7)
Neutrophils Relative %: 61 %
Platelets: 190 10*3/uL (ref 150–400)
RBC: 3.71 MIL/uL — ABNORMAL LOW (ref 4.22–5.81)
RDW: 14.1 % (ref 11.5–15.5)
WBC: 4.8 10*3/uL (ref 4.0–10.5)
nRBC: 0 % (ref 0.0–0.2)

## 2020-12-14 LAB — LACTATE DEHYDROGENASE: LDH: 137 U/L (ref 98–192)

## 2020-12-14 LAB — T4, FREE: Free T4: 0.58 ng/dL — ABNORMAL LOW (ref 0.61–1.12)

## 2020-12-14 LAB — TSH: TSH: 3.041 u[IU]/mL (ref 0.350–4.500)

## 2020-12-14 MED ORDER — CLONAZEPAM 0.5 MG PO TABS: ORAL_TABLET | ORAL | 3 refills | Status: DC

## 2020-12-14 MED ORDER — SODIUM CHLORIDE 0.9% FLUSH
10.0000 mL | Freq: Once | INTRAVENOUS | Status: AC
  Filled 2020-12-14: qty 10

## 2020-12-14 MED ORDER — SODIUM CHLORIDE 0.9 % IV SOLN
Freq: Once | INTRAVENOUS | Status: AC
  Filled 2020-12-14: qty 250

## 2020-12-14 MED ORDER — SODIUM CHLORIDE 0.9 % IV SOLN
240.0000 mg | Freq: Once | INTRAVENOUS | Status: AC
  Administered 2020-12-14: 240 mg via INTRAVENOUS
  Filled 2020-12-14: qty 24

## 2020-12-14 MED ORDER — HEPARIN SOD (PORK) LOCK FLUSH 100 UNIT/ML IV SOLN
500.0000 [IU] | Freq: Once | INTRAVENOUS | Status: DC | PRN
  Filled 2020-12-14: qty 5

## 2020-12-14 MED ORDER — HEPARIN SOD (PORK) LOCK FLUSH 100 UNIT/ML IV SOLN
INTRAVENOUS | Status: AC
  Administered 2020-12-14: 500 [IU] via INTRAVENOUS
  Filled 2020-12-14: qty 5

## 2020-12-14 MED ORDER — HEPARIN SOD (PORK) LOCK FLUSH 100 UNIT/ML IV SOLN
500.0000 [IU] | Freq: Once | INTRAVENOUS | Status: AC
  Filled 2020-12-14: qty 5

## 2020-12-14 NOTE — Telephone Encounter (Signed)
Pt called needing a refill request for his clonazePAM (KLONOPIN) 0.5 MG tablet sent in to [facility] [place]

## 2020-12-14 NOTE — Patient Instructions (Signed)
[facility]  Discharge Instructions: Thank you for choosing [facility] to provide your oncology and hematology care.  If you have a lab appointment with the Cancer Center, please go directly to the Cancer Center and check in at the registration area.  Wear comfortable clothing and clothing appropriate for easy access to any Portacath or PICC line.   We strive to give you quality time with your provider. You may need to reschedule your appointment if you arrive late (15 or more minutes).  Arriving late affects you and other patients whose appointments are after yours.  Also, if you miss three or more appointments without notifying the office, you may be dismissed from the clinic at the provider's discretion.      For prescription refill requests, have your pharmacy contact our office and allow 72 hours for refills to be completed.    Today you received the following chemotherapy and/or immunotherapy agents Nivolumab       To help prevent nausea and vomiting after your treatment, we encourage you to take your nausea medication as directed.  BELOW ARE SYMPTOMS THAT SHOULD BE REPORTED IMMEDIATELY: *FEVER GREATER THAN 100.4 F (38 C) OR HIGHER *CHILLS OR SWEATING *NAUSEA AND VOMITING THAT IS NOT CONTROLLED WITH YOUR NAUSEA MEDICATION *UNUSUAL SHORTNESS OF BREATH *UNUSUAL BRUISING OR BLEEDING *URINARY PROBLEMS (pain or burning when urinating, or frequent urination) *BOWEL PROBLEMS (unusual diarrhea, constipation, pain near the anus) TENDERNESS IN MOUTH AND THROAT WITH OR WITHOUT PRESENCE OF ULCERS (sore throat, sores in mouth, or a toothache) UNUSUAL RASH, SWELLING OR PAIN  UNUSUAL VAGINAL DISCHARGE OR ITCHING   Items with * indicate a potential emergency and should be followed up as soon as possible or go to the Emergency Department if any problems should occur.  Please show the CHEMOTHERAPY ALERT CARD or IMMUNOTHERAPY ALERT CARD at check-in  to the Emergency Department and triage nurse.  Should you have questions after your visit or need to cancel or reschedule your appointment, please contact [facility]  [phone] and follow the prompts.  Office hours are 8:00 a.m. to 4:30 p.m. Monday - Friday. Please note that voicemails left after 4:00 p.m. may not be returned until the following business day.  We are closed weekends and major holidays. You have access to a nurse at all times for urgent questions. Please call the main number to the clinic [phone] and follow the prompts.  For any non-urgent questions, you may also contact your provider using MyChart. We now offer e-Visits for anyone 18 and older to request care online for non-urgent symptoms. For details visit [email or web address].   Also download the MyChart app! Go to the app store, search "MyChart", open the app, select [place], and log in with your MyChart username and password.  Due to Covid, a mask is required upon entering the hospital/clinic. If you do not have a mask, one will be given to you upon arrival. For doctor visits, patients may have 1 support person aged 18 or older with them. For treatment visits, patients cannot have anyone with them due to current Covid guidelines and our immunocompromised population.  

## 2020-12-14 NOTE — Progress Notes (Signed)
Hematology/Oncology follow up  note [facility] [phone] [phone]   Patient Care Team: Mechele Claude, FNP as PCP - General (Family Medicine) [name], MD as Consulting Physician (Oncology)  REFERRING PROVIDER: Mechele Claude, FNP  CHIEF COMPLAINTS/REASON FOR VISIT:  Follow up for melanoma HISTORY OF PRESENTING ILLNESS:   Edward Pitts is a  60 y.o.  male with PMH listed below was seen in consultation at the request of  Mechele Claude, FNP  for evaluation of inguinal mass Patient presented to emergency room 3 days ago complaining about left ing uinal mass discomfort. Reports that he has really noticed the mass growing for the past 1 months. He has a history of left lower extremity melanoma in 2011, status post local excision.  Pain was increased with squatting of laxation. He was advised to take Tylenol for pain. Denies any fever, chills, night sweating.  He does feel mild nauseated. Appetite is fair.  He has lost about 10 pounds since earlier this year. 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  Left inguinal and left iliac nodes which are suspicious for involvement.  There are also 2 small nonspecific hypodense lesions within the right liver, nonspecific.  # patient underwent left groin mass resection on 04/16/2019. Resection pathology showed malignant melanoma, replacing a lymph node, with extracapsular extension, peripheral and deep margins involved.  Left inguinal contents, all 7 lymph nodes were negative for melanoma in the lymph nodes.  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  # PDL1 80% TPS  # 04/16/2019. underwent left groin mass resection   07/07/2019  Status post adjuvant radiation and finished radiation  Patient has Mediport placed to facilitate immunotherapy  treatments.  06/29/2020, CT chest abdomen pelvis showed stable postoperative appearance of the left groin.  No evidence of local recurrence.  No evidence of metastatic disease in the chest abdomen or pelvis.  Hepatic steatosis.  Stable subcentimeter fluid attenuation lesion of the lateral right lobe of the liver, likely benign cyst or hemangioma.  Coronary artery disease.  Aortic atherosclerosis  10/20/2020, CT chest abdomen pelvis showed stable postoperative/radiation appearance of the left groin.  No evidence of local recurrence/metastatic disease within the chest abdomen/pelvis.  Fatty liver disease.  Diverticulosis without evidence of typhlitis.  Aortic atherosclerosis  INTERVAL HISTORY Edward Pitts is a 60 y.o. male who has above history reviewed by me today presents for follow up visit for management of inguinal nodal recurrence of melanoma No new complaints.  Left groin skin irritation has improved.  Chronic left lower extremity lymphedema, he wears compression stocking.  Symptoms are stable.   . :Review of Systems  Constitutional:  Negative for appetite change, chills, fatigue, fever and unexpected weight change.  HENT:   Negative for hearing loss and voice change.   Eyes:  Negative for eye problems and icterus ().  Respiratory:  Negative for chest tightness, cough and shortness of breath.   Cardiovascular:  Negative for chest pain and leg swelling.  Gastrointestinal:  Negative for abdominal distention and abdominal pain.  Endocrine: Negative for hot flashes.  Genitourinary:  Negative for difficulty urinating, dysuria and frequency.   Musculoskeletal:        Status post left hip replacement  Skin:  Negative for itching and rash.       Skin hypo-pigmentation on upper extremities, no change  Neurological:  Negative for  light-headedness and numbness.  Hematological:  Negative for adenopathy. Does not bruise/bleed easily.  Psychiatric/Behavioral:  Negative for confusion.    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   Family history of adverse reaction to anesthesia    PONV mother   GERD (gastroesophageal reflux disease)    History of complete heart block    PPM placed   Hyperlipidemia    Hypertension    LBBB (left bundle branch block)    Lymphedema of left leg    uses thigh high compression stockings   Melanoma ([place]) 2012   skin cancer, left thigh   OSA on CPAP    PONV (postoperative nausea and vomiting) 04/16/2019   Port-A-Cath in place    RIGHT chest wall   Presence of cardiac pacemaker    [employer]   Seizures ([place])    still has episodes of dizziness. last event 1 month ago (march 2022) and will pass out. takes clonazepam    SURGICAL HISTORY: Past Surgical History:  Procedure Laterality Date   CT RADIATION THERAPY GUIDE     left groin   dental implant     permanent implant   KNEE SURGERY Left    arthroscopy   LEFT HEART CATH AND CORONARY ANGIOGRAPHY Left 06/29/2017   Procedure: LEFT HEART CATH AND CORONARY ANGIOGRAPHY;  Surgeon: [name], MD;  Location: [place] [facility];  Service: Cardiovascular;  Laterality: Left;   LYMPH NODE DISSECTION Left 04/16/2019   Procedure: Left inguinal Lymph Node Dissection;  Surgeon: [name], MD;  Location: [place];  Service: General;  Laterality: Left;   MELANOMA EXCISION Left 04/16/2019   Procedure: MELANOMA EXCISION LEFT GROIN MASS;  Surgeon: [name], MD;  Location: [place];  Service: General;  Laterality: Left;   MELANOMA EXCISION WITH SENTINEL LYMPH NODE BIOPSY Left 2012   Left calf    PACEMAKER INSERTION N/A 08/26/2018   Procedure: INSERTION PACEMAKER;  Surgeon: [name],  MD;  Location: [facility];  Service: Cardiovascular;  Laterality: N/A;   PORTA CATH INSERTION N/A 08/26/2019   Procedure: PORTA CATH INSERTION;  Surgeon: Katha Cabal, MD;  Location: [facility] [facility] [facility];  Service: Cardiovascular;  Laterality: N/A;   SUPERFICIAL LYMPH NODE BIOPSY / EXCISION Left 2020   lymph nodes removed around left groin melanoma site   TEMPORARY PACEMAKER N/A 08/25/2018   Procedure: TEMPORARY PACEMAKER;  Surgeon: [name], MD;  Location: [facility];  Service: Cardiovascular;  Laterality: N/A;   TOTAL HIP ARTHROPLASTY Left 07/14/2020   Procedure: TOTAL HIP ARTHROPLASTY;  Surgeon: Dereck Leep, MD;  Location: [facility];  Service: Orthopedics;  Laterality: Left;    SOCIAL HISTORY: Social History   Socioeconomic History   Marital status: Married    Spouse name: Vicente Males    Number of children: 7   Years of education: 12   Highest education level: Not on file  Occupational History    Comment: disability  Tobacco Use   Smoking status: Never   Smokeless tobacco: Never  Vaping Use   Vaping Use: Never used  Substance and Sexual Activity   Alcohol use: No   Drug use: No   Sexual activity: Not Currently  Other Topics Concern   Not on file  Social History Narrative   Lives with  Wife,   Has 2 small dogs   Caffeine use: sodas (2 per day)      Out of work on disability.  Has a walk in shower. No stairs to climb   Oncology treatment ongoing. Uses port a cath for treatment.      pacemaker   Social Determinants of Health   Financial Resource Strain: Not on file  Food Insecurity: Not on file  Transportation Needs: Not on file  Physical Activity: Not on file  Stress: Not on file  Social Connections: Not on file  Intimate Partner Violence: Not on file    FAMILY HISTORY: Family History  Problem Relation Age of Onset   Cancer Paternal Grandmother     ALLERGIES:  is allergic to ibuprofen and nsaids.  MEDICATIONS:  Current Outpatient  Medications  Medication Sig Dispense Refill   allopurinol (ZYLOPRIM) 300 MG tablet Take 300 mg by mouth daily.     ASPIRIN 81 PO Take 1 tablet by mouth daily.     atorvastatin (LIPITOR) 20 MG tablet Take 20 mg by mouth daily.     clonazePAM (KLONOPIN) 0.5 MG tablet 1 tablet in the morning, 2 in the evening 90 tablet 3   hydrocortisone 2.5 % ointment Apply topically 2 (two) times daily.     ketoconazole (NIZORAL) 2 % cream Apply 1 application topically daily.     lidocaine-prilocaine (EMLA) cream Apply 1 application topically as needed. Apply small amount of cream to port site approx 1-2 hours prior to appointment. 30 g 2   lisinopril (ZESTRIL) 20 MG tablet Take 20 mg by mouth daily.     metoprolol succinate (TOPROL-XL) 25 MG 24 hr tablet Take 25 mg by mouth daily.      Multiple Vitamin (MULTIVITAMIN WITH MINERALS) TABS tablet Take 1 tablet by mouth daily. Centrum Silver     nystatin (MYCOSTATIN/NYSTOP) powder Apply 1 application topically 3 (three) times daily. Apply small amount to affected area 3 times a day until healed 60 g 2   omeprazole (PRILOSEC) 20 MG capsule TAKE 1 CAPSULE BY MOUTH EVERY DAY 30 capsule 1   ondansetron (ZOFRAN) 4 MG tablet TAKE 1 TABLET BY MOUTH EVERY 8 HOURS AS NEEDED FOR NAUSEA AND VOMITING 90 tablet 1   oxyCODONE (OXY IR/ROXICODONE) 5 MG immediate release tablet Take 1 tablet (5 mg total) by mouth every 4 (four) hours as needed for moderate pain (pain score 4-6). (Patient not taking: No sig reported) 30 tablet 0   No current facility-administered medications for this visit.     PHYSICAL EXAMINATION: ECOG PERFORMANCE STATUS: 1 - Symptomatic but completely ambulatory Vitals:   12/14/20 1016  BP: (!) 128/51  Pulse: 74  Resp: 17  Temp: (!) 96.2 F (35.7 C)  SpO2: 96%   Filed Weights   12/14/20 1016  Weight: 242 lb 12.8 oz (110.1 kg)    Physical Exam Constitutional:      General: He is not in acute distress.    Comments: Patient ambulates independently   HENT:     Head: Normocephalic and atraumatic.  Eyes:     General: No scleral icterus.    Pupils: Pupils are equal, round, and reactive to light.  Cardiovascular:     Rate and Rhythm: Normal rate and regular rhythm.  Heart sounds: Normal heart sounds.  Pulmonary:     Effort: Pulmonary effort is normal. No respiratory distress.     Breath sounds: No wheezing.  Abdominal:     General: Bowel sounds are normal. There is no distension.     Palpations: Abdomen is soft. There is no mass.     Tenderness: There is no abdominal tenderness.     Comments:    Musculoskeletal:        General: No deformity. Normal range of motion.     Cervical back: Normal range of motion and neck supple.     Comments: Left lower extremity edema Left hip replacement  Skin:    General: Skin is warm and dry.  Neurological:     Mental Status: He is alert and oriented to person, place, and time. Mental status is at baseline.     Cranial Nerves: No cranial nerve deficit.     Coordination: Coordination normal.  Psychiatric:        Mood and Affect: Mood normal.  RN Arvin Collard serves as chaperone during physical examination.  LABORATORY DATA:  I have reviewed the data as listed Lab Results  Component Value Date   WBC 4.8 12/14/2020   HGB 11.2 (L) 12/14/2020   HCT 33.2 (L) 12/14/2020   MCV 89.5 12/14/2020   PLT 190 12/14/2020   Recent Labs    12/25/19 0905 01/08/20 0851 11/16/20 0817 11/30/20 0954 12/14/20 1003  NA 140   < > 136 138 137  K 4.4   < > 4.2 4.1 4.7  CL 106   < > 105 107 105  CO2 27   < > _0 GLUCOSE 109*   < > 150* 133* 104*  BUN 20   < > 27* 32* 27*  CREATININE 1.22   < > 1.32* 1.25* 1.10  CALCIUM 9.1   < > 9.1 8.8* 9.3  GFRNONAA >60   < > >60 >60 >60  GFRAA >60  --   --   --   --   PROT 7.0   < > 7.2 7.3 7.3  ALBUMIN 4.1   < > 3.9 4.0 4.1  AST 26   < > 27 27 33  ALT 32   < > 27 34 39  ALKPHOS 80   < > 94 90 92  BILITOT 0.9   < > 0.5 0.3 0.8   < > = values in  this interval not displayed.    Iron/TIBC/Ferritin/ %Sat    Component Value Date/Time   IRON 85 10/02/2019 0841   TIBC 342 10/02/2019 0841   FERRITIN 61 10/02/2019 0841   IRONPCTSAT 25 10/02/2019 0841      RADIOGRAPHIC STUDIES: I have personally reviewed the radiological images as listed and agreed with the findings in the report. DG Fluoro Guide CV Line Right  Result Date: 10/05/2020 INDICATION: Right IJ port catheter, no blood return EXAM: FLUOROSCOPIC RIGHT IJ POWER PORT CATHETER INJECTION MEDICATIONS: NONE. ANESTHESIA/SEDATION: NONE. FLUOROSCOPY TIME:  Fluoroscopy Time: 0 minutes 48 seconds (15 mGy). COMPLICATIONS: None immediate. PROCEDURE: Informed written consent was obtained from the patient after a thorough discussion of the procedural risks, benefits and alternatives. All questions were addressed. Maximal Sterile Barrier Technique was utilized including caps, mask, sterile gowns, sterile gloves, sterile drape, hand hygiene and skin antiseptic. A timeout was performed prior to the initiation of the procedure. Under sterile conditions, the existing accessed right IJ power port catheter was injected with contrast. Fluoroscopic imaging  performed. Port catheter is intact and patent. Tip in the SVC RA junction. At the catheter tip, contrast does not flow freely from the tip compatible with a small amount of catheter tip nonocclusive thrombus or fibrin sheath. This likely accounts for the lack of blood return. Catheter flushed with saline in the appropriate volume of heparin. Needle removed. IMPRESSION: Trace amount of thrombus or fibrin sheath at the port catheter tip. Electronically Signed   By: Jerilynn Mages.  Shick M.D.   On: 10/05/2020 12:31   CT CHEST ABDOMEN PELVIS WO CONTRAST  Result Date: 10/21/2020 CLINICAL DATA:  History of melanoma diagnosed in 2012 with Mets to left inguinal area in October 2020 status post surgical resection in January 2021. EXAM: CT CHEST, ABDOMEN AND PELVIS WITHOUT  CONTRAST TECHNIQUE: Multidetector CT imaging of the chest, abdomen and pelvis was performed following the standard protocol without IV contrast. COMPARISON:  Multiple prior is including most recent CT June 29, 2020. FINDINGS: CT CHEST FINDINGS Cardiovascular: Right chest wall Port-A-Cath with tip at the superior cavoatrial junction. Left chest multi lead pacemaker. Aortic atherosclerosis without aneurysmal dilation. Normal caliber central pulmonary arteries. Left coronary artery calcifications. No significant pericardial effusion/thickening. Mediastinum/Nodes: No discrete thyroid nodule. No pathologically enlarged mediastinal, hilar or axillary lymph nodes, noting limited sensitivity for the detection of hilar adenopathy on this noncontrast study. The trachea and esophagus are grossly unremarkable. Lungs/Pleura: No suspicious pulmonary nodules or masses. No pleural effusion. No pneumothorax. Musculoskeletal: No aggressive lytic or blastic lesion of bone. No chest wall mass. CT ABDOMEN PELVIS FINDINGS Hepatobiliary: Hepatic steatosis, otherwise unremarkable noncontrast appearance of the hepatic parenchyma. Gallbladder is unremarkable. No biliary ductal dilation. Pancreas: Within normal limits. Spleen: Within normal limits. Adrenals/Urinary Tract: Bilateral adrenal glands are unremarkable. Right lower pole renal cyst. No hydronephrosis. No nephrolithiasis. Urinary bladder is unremarkable for degree of distension. Stomach/Bowel: Stomach is within normal limits. Colonic diverticulosis without findings of acute diverticulitis. No evidence of bowel wall thickening, distention, or inflammatory changes. Vascular/Lymphatic: Aortic atherosclerosis without aneurysmal dilation. No pathologically enlarged abdominal or pelvic lymph nodes. Reproductive: Prostate is unremarkable. Other: Similar findings of left inguinal lymph node resection with overlying skin thickening and subcutaneous stranding in keeping with radiation therapy.  Musculoskeletal: Changes of interval left hip arthroplasty. Right hip degenerative change. Degenerative changes spine. No aggressive lytic or blastic lesion of bone. IMPRESSION: 1. Stable postoperative/post radiation appearance of the left groin. No evidence of local recurrence or metastatic disease within the chest, abdomen, or pelvis. 2. Hepatic steatosis. 3. Colonic diverticulosis without findings of acute diverticulitis. 4. Changes of interval left hip arthroplasty. 5.  Aortic Atherosclerosis (ICD10-I70.0). Electronically Signed   By: Dahlia Bailiff MD   On: 10/21/2020 11:19        ASSESSMENT & PLAN:  1. Malignant melanoma of overlapping sites ([place])   2. Encounter for antineoplastic immunotherapy   3. Anemia, unspecified type    #Left inguinal nodal recurrence of melanoma.  Status post resection and adjuvant radiation. Stage IV Recurrent, now in NED Labs are reviewed and discussed with patient. Proceed with nivolumab treatment today.    #Erythematous changes of left inguinal area, improved with  nystatin powder 4 times daily as needed  #Vitiligo, stable #Normocytic anemia, hemoglobin is at 11.2.  Monitor. Follow-up 2 weeks We spent sufficient time to discuss many aspect of care, questions were answered to patient's satisfaction.   [name], MD, PhD Hematology Oncology [place] at [facility]  12/14/2020

## 2020-12-14 NOTE — Telephone Encounter (Signed)
 drug registry has been verified. Last refill was 11/01/20 # 90 for a 30 day supply. Pt has scheduled yearly f/u on the books for 12/21/2020.

## 2020-12-17 ENCOUNTER — Other Ambulatory Visit: Payer: Self-pay | Admitting: Oncology

## 2020-12-21 ENCOUNTER — Ambulatory Visit (INDEPENDENT_AMBULATORY_CARE_PROVIDER_SITE_OTHER): Admitting: Neurology

## 2020-12-21 ENCOUNTER — Encounter: Payer: Self-pay | Admitting: Neurology

## 2020-12-21 ENCOUNTER — Encounter: Payer: Self-pay | Admitting: Oncology

## 2020-12-21 VITALS — BP 147/71 | HR 80 | Ht 67.0 in | Wt 244.8 lb

## 2020-12-21 DIAGNOSIS — R42 Dizziness and giddiness: Secondary | ICD-10-CM

## 2020-12-21 DIAGNOSIS — R55 Syncope and collapse: Secondary | ICD-10-CM

## 2020-12-21 MED ORDER — LEVETIRACETAM 250 MG PO TABS: ORAL_TABLET | ORAL | 3 refills | Status: DC

## 2020-12-21 NOTE — Progress Notes (Signed)
Reason for visit: Near syncope  Edward Pitts is an 60 y.o. male  History of present illness:  Edward Pitts is a 60 year old right-handed white male with a history of episodes of syncope and near syncope.  The patient had a pacemaker placed which seems to prevent the episodes of syncope but he still has relatively frequent events of altered mental status.  He will have episodes lasting 30 to 60 seconds where he feels like his head is in a vice, he feels like he is in a daydream.  He at times may feel palpitations of the heart with these events.  The events of occur 2 or 3 times a month.  He notes that clonazepam seems to help reduce the frequency of these episodes.  He recently was diagnosed with sleep apnea, he is now on CPAP at night.  He is sleeping better.  He also recently had a left total hip replacement.  He continues to operate a motor vehicle, he has not had any events while driving.  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   Family history of adverse reaction to anesthesia    PONV mother   GERD (gastroesophageal reflux disease)    History of complete heart block    PPM placed   Hyperlipidemia    Hypertension    LBBB (left bundle branch block)    Lymphedema of left leg    uses thigh high compression stockings   Melanoma ([place]) 2012   skin cancer, left thigh   OSA on CPAP    PONV (postoperative nausea and vomiting) 04/16/2019   Port-A-Cath in place    RIGHT chest wall   Presence of cardiac pacemaker    [employer]   Seizures ([place])    still has episodes of dizziness. last event 1 month ago (march 2022) and will pass out. takes clonazepam    Past Surgical History:  Procedure  Laterality Date   CT RADIATION THERAPY GUIDE     left groin   dental implant     permanent implant   KNEE SURGERY Left    arthroscopy   LEFT HEART CATH AND CORONARY ANGIOGRAPHY Left 06/29/2017   Procedure: LEFT HEART CATH AND CORONARY ANGIOGRAPHY;  Surgeon: [name], MD;  Location: [facility];  Service: Cardiovascular;  Laterality: Left;   LYMPH NODE DISSECTION Left 04/16/2019   Procedure: Left inguinal Lymph Node Dissection;  Surgeon: [name], MD;  Location: [facility];  Service: General;  Laterality: Left;   MELANOMA EXCISION Left 04/16/2019   Procedure: MELANOMA EXCISION LEFT GROIN MASS;  Surgeon: [name], MD;  Location: [facility];  Service: General;  Laterality: Left;   MELANOMA EXCISION WITH SENTINEL LYMPH NODE BIOPSY Left 2012   Left calf    PACEMAKER INSERTION N/A 08/26/2018   Procedure: INSERTION PACEMAKER;  Surgeon: [name], MD;  Location: [facility];  Service: Cardiovascular;  Laterality: N/A;   PORTA CATH INSERTION N/A 08/26/2019   Procedure: PORTA CATH INSERTION;  Surgeon: Katha Cabal, MD;  Location: [facility];  Service: Cardiovascular;  Laterality: N/A;  SUPERFICIAL LYMPH NODE BIOPSY / EXCISION Left 2020   lymph nodes removed around left groin melanoma site   TEMPORARY PACEMAKER N/A 08/25/2018   Procedure: TEMPORARY PACEMAKER;  Surgeon: [name], MD;  Location: [facility];  Service: Cardiovascular;  Laterality: N/A;   TOTAL HIP ARTHROPLASTY Left 07/14/2020   Procedure: TOTAL HIP ARTHROPLASTY;  Surgeon: Dereck Leep, MD;  Location: [facility];  Service: Orthopedics;  Laterality: Left;    Family History  Problem Relation Age of Onset   Cancer Paternal Grandmother     Social history:  reports that he has never smoked. He has never used smokeless tobacco. He reports that he does not drink alcohol and does not use drugs.    Allergies  Allergen Reactions   Ibuprofen Other (See Comments)    Affects kidneys    Nsaids     Affects kidneys    Medications:  Prior to Admission medications   Medication Sig Start Date End Date Taking? Authorizing Provider  allopurinol (ZYLOPRIM) 300 MG tablet Take 300 mg by mouth daily. 06/12/18  Yes [provider]  ASPIRIN 81 PO Take 1 tablet by mouth daily.   Yes [provider]  atorvastatin (LIPITOR) 20 MG tablet Take 20 mg by mouth daily. 07/15/18  Yes [provider]  clonazePAM (KLONOPIN) 0.5 MG tablet 1 tablet in the morning, 2 in the evening 12/14/20  Yes Kathrynn Ducking, MD  hydrocortisone 2.5 % ointment Apply topically 2 (two) times daily.   Yes [provider]  ketoconazole (NIZORAL) 2 % cream Apply 1 application topically daily.   Yes [provider]  lidocaine-prilocaine (EMLA) cream Apply 1 application topically as needed. Apply small amount of cream to port site approx 1-2 hours prior to appointment. 10/05/20  Yes [name], MD  lisinopril (ZESTRIL) 20 MG tablet Take 20 mg by mouth daily.   Yes [provider]  metoprolol succinate (TOPROL-XL) 25 MG 24 hr tablet Take 25 mg by mouth daily.  12/15/18  Yes [provider]  Multiple Vitamin (MULTIVITAMIN WITH MINERALS) TABS tablet Take 1 tablet by mouth daily. Centrum Silver   Yes [provider]  nystatin (MYCOSTATIN/NYSTOP) powder Apply 1 application topically 3 (three) times daily. Apply small amount to affected area 3 times a day until healed 11/30/20  Yes [name], MD  omeprazole (PRILOSEC) 20 MG capsule TAKE 1 CAPSULE BY MOUTH EVERY DAY 12/17/20  Yes [name], MD  ondansetron (ZOFRAN) 4 MG tablet TAKE 1 TABLET BY MOUTH EVERY 8 HOURS AS NEEDED FOR NAUSEA AND VOMITING 06/23/20  Yes [name], MD  oxyCODONE (OXY IR/ROXICODONE) 5 MG immediate release tablet Take 1 tablet (5 mg total) by mouth every 4 (four) hours as needed for moderate pain (pain score 4-6). Patient not taking: No sig reported 07/15/20   Tamala Julian B, PA-C    ROS:  Out of  a complete 14 system review of symptoms, the patient complains only of the following symptoms, and all other reviewed systems are negative.  Dizziness, near syncope  Blood pressure (!) 147/71, pulse 80, height 5\' 7"  (1.702 m), weight 244 lb 12.8 oz (111 kg).  Physical Exam  General: The patient is alert and cooperative at the time of the examination.  The patient is markedly obese.  Skin: No significant peripheral edema is noted.   Neurologic Exam  Mental status: The patient is alert and oriented x 3 at the time of the examination. The patient has apparent normal recent and  remote memory, with an apparently normal attention span and concentration ability.   Cranial nerves: Facial symmetry is present. Speech is normal, no aphasia or dysarthria is noted. Extraocular movements are full. Visual fields are full.  Motor: The patient has good strength in all 4 extremities, with exception of 4+/5 strength with hip flexion on the left.  Sensory examination: Soft touch sensation is symmetric on the face, arms, and legs.  Coordination: The patient has good finger-nose-finger and heel-to-shin bilaterally.  Gait and station: The patient has a normal gait. Tandem gait is slightly unsteady. Romberg is negative. No drift is seen.  Reflexes: Deep tendon reflexes are symmetric, with exception that the right ankle jerk reflex is more brisk than that on the left.   Assessment/Plan:  1.  Episodes of near syncope, altered mental status  The etiology of above events is not clear.  The patient indicates that clonazepam does seem to help the episodes.  We will give an empiric trial on Keppra to see if this improves the episodes, he will call in 3 months to report his progress.  If no benefit is noted, the Keppra should be tapered off.  He will otherwise follow-up in 6 months, in the future he can be seen through Dr. April Manson.  Jill Alexanders MD 12/21/2020 9:48 AM  [facility] [address] [place], [place] [address]  Phone [phone] Fax [phone]

## 2020-12-21 NOTE — Patient Instructions (Signed)
We will try a course of Keppra to see if this helps the spell frequency.

## 2020-12-22 ENCOUNTER — Telehealth: Payer: Self-pay | Admitting: Neurology

## 2020-12-22 NOTE — Telephone Encounter (Signed)
Pt called wants to know does he need to continue take the clonazePAM (KLONOPIN) 0.5 MG tablet or stop and start taking the new levETIRAcetam (KEPPRA) 250 MG tablet. Pt requesting a call back.

## 2020-12-22 NOTE — Telephone Encounter (Signed)
I called patient.  For now, I want him to take both the clonazepam and the Keppra together.

## 2020-12-28 ENCOUNTER — Inpatient Hospital Stay (HOSPITAL_BASED_OUTPATIENT_CLINIC_OR_DEPARTMENT_OTHER): Admitting: Oncology

## 2020-12-28 ENCOUNTER — Inpatient Hospital Stay: Attending: Oncology

## 2020-12-28 ENCOUNTER — Encounter: Payer: Self-pay | Admitting: Oncology

## 2020-12-28 ENCOUNTER — Inpatient Hospital Stay

## 2020-12-28 VITALS — BP 125/52 | HR 78 | Temp 98.5°F | Resp 18 | Wt 243.6 lb

## 2020-12-28 DIAGNOSIS — Z5112 Encounter for antineoplastic immunotherapy: Secondary | ICD-10-CM | POA: Diagnosis not present

## 2020-12-28 DIAGNOSIS — C438 Malignant melanoma of overlapping sites of skin: Secondary | ICD-10-CM

## 2020-12-28 DIAGNOSIS — D649 Anemia, unspecified: Secondary | ICD-10-CM

## 2020-12-28 DIAGNOSIS — L80 Vitiligo: Secondary | ICD-10-CM

## 2020-12-28 DIAGNOSIS — C4359 Malignant melanoma of other part of trunk: Secondary | ICD-10-CM | POA: Insufficient documentation

## 2020-12-28 DIAGNOSIS — R7989 Other specified abnormal findings of blood chemistry: Secondary | ICD-10-CM | POA: Diagnosis not present

## 2020-12-28 LAB — CBC WITH DIFFERENTIAL/PLATELET
Abs Immature Granulocytes: 0.05 10*3/uL (ref 0.00–0.07)
Basophils Absolute: 0 10*3/uL (ref 0.0–0.1)
Basophils Relative: 0 %
Eosinophils Absolute: 0.2 10*3/uL (ref 0.0–0.5)
Eosinophils Relative: 4 %
HCT: 33.1 % — ABNORMAL LOW (ref 39.0–52.0)
Hemoglobin: 11 g/dL — ABNORMAL LOW (ref 13.0–17.0)
Immature Granulocytes: 1 %
Lymphocytes Relative: 17 %
Lymphs Abs: 0.9 10*3/uL (ref 0.7–4.0)
MCH: 30.1 pg (ref 26.0–34.0)
MCHC: 33.2 g/dL (ref 30.0–36.0)
MCV: 90.7 fL (ref 80.0–100.0)
Monocytes Absolute: 0.4 10*3/uL (ref 0.1–1.0)
Monocytes Relative: 9 %
Neutro Abs: 3.4 10*3/uL (ref 1.7–7.7)
Neutrophils Relative %: 69 %
Platelets: 174 10*3/uL (ref 150–400)
RBC: 3.65 MIL/uL — ABNORMAL LOW (ref 4.22–5.81)
RDW: 13.7 % (ref 11.5–15.5)
WBC: 4.9 10*3/uL (ref 4.0–10.5)
nRBC: 0 % (ref 0.0–0.2)

## 2020-12-28 LAB — COMPREHENSIVE METABOLIC PANEL
ALT: 40 U/L (ref 0–44)
AST: 33 U/L (ref 15–41)
Albumin: 4.1 g/dL (ref 3.5–5.0)
Alkaline Phosphatase: 94 U/L (ref 38–126)
Anion gap: 7 (ref 5–15)
BUN: 21 mg/dL (ref 8–23)
CO2: 24 mmol/L (ref 22–32)
Calcium: 8.8 mg/dL — ABNORMAL LOW (ref 8.9–10.3)
Chloride: 106 mmol/L (ref 98–111)
Creatinine, Ser: 1.07 mg/dL (ref 0.61–1.24)
GFR, Estimated: >60 mL/min (ref >60)
Glucose, Bld: 143 mg/dL — ABNORMAL HIGH (ref 70–99)
Potassium: 3.9 mmol/L (ref 3.5–5.1)
Sodium: 137 mmol/L (ref 135–145)
Total Bilirubin: 0.7 mg/dL (ref 0.3–1.2)
Total Protein: 7.3 g/dL (ref 6.5–8.1)

## 2020-12-28 MED ORDER — HEPARIN SOD (PORK) LOCK FLUSH 100 UNIT/ML IV SOLN
500.0000 [IU] | Freq: Once | INTRAVENOUS | Status: AC
  Administered 2020-12-28: 500 [IU] via INTRAVENOUS
  Filled 2020-12-28: qty 5

## 2020-12-28 MED ORDER — SODIUM CHLORIDE 0.9% FLUSH
10.0000 mL | INTRAVENOUS | Status: DC | PRN
  Administered 2020-12-28: 10 mL via INTRAVENOUS
  Filled 2020-12-28: qty 10

## 2020-12-28 MED ORDER — SODIUM CHLORIDE 0.9 % IV SOLN
Freq: Once | INTRAVENOUS | Status: AC
  Filled 2020-12-28: qty 250

## 2020-12-28 MED ORDER — SODIUM CHLORIDE 0.9 % IV SOLN
240.0000 mg | Freq: Once | INTRAVENOUS | Status: AC
  Administered 2020-12-28: 240 mg via INTRAVENOUS
  Filled 2020-12-28: qty 24

## 2020-12-28 NOTE — Progress Notes (Signed)
Pt here for follow up. No new concerns voiced.   

## 2020-12-28 NOTE — Progress Notes (Signed)
Hematology/Oncology follow up  note [facility] Telephone:([phone] [phone]   Patient Care Team: Mechele Claude, FNP as PCP - General (Family Medicine) [name], MD as Consulting Physician (Oncology)  REFERRING PROVIDER: Mechele Claude, FNP  CHIEF COMPLAINTS/REASON FOR VISIT:  Follow up for melanoma HISTORY OF PRESENTING ILLNESS:   Edward Pitts is a  60 y.o.  male with PMH listed below was seen in consultation at the request of  Mechele Claude, FNP  for evaluation of inguinal mass Patient presented to emergency room 3 days ago complaining about left ing uinal mass discomfort. Reports that he has really noticed the mass growing for the past 1 months. He has a history of left lower extremity melanoma in 2011, status post local excision.  Pain was increased with squatting of laxation. He was advised to take Tylenol for pain. Denies any fever, chills, night sweating.  He does feel mild nauseated. Appetite is fair.  He has lost about 10 pounds since earlier this year. 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  Left inguinal and left iliac nodes which are suspicious for involvement.  There are also 2 small nonspecific hypodense lesions within the right liver, nonspecific.  # patient underwent left groin mass resection on 04/16/2019. Resection pathology showed malignant melanoma, replacing a lymph node, with extracapsular extension, peripheral and deep margins involved.  Left inguinal contents, all 7 lymph nodes were negative for melanoma in the lymph nodes.  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  # PDL1 80% TPS  # 04/16/2019. underwent left groin mass resection   07/07/2019  Status post adjuvant radiation and finished radiation  Patient has Mediport placed to facilitate immunotherapy  treatments.  06/29/2020, CT chest abdomen pelvis showed stable postoperative appearance of the left groin.  No evidence of local recurrence.  No evidence of metastatic disease in the chest abdomen or pelvis.  Hepatic steatosis.  Stable subcentimeter fluid attenuation lesion of the lateral right lobe of the liver, likely benign cyst or hemangioma.  Coronary artery disease.  Aortic atherosclerosis  10/20/2020, CT chest abdomen pelvis showed stable postoperative/radiation appearance of the left groin.  No evidence of local recurrence/metastatic disease within the chest abdomen/pelvis.  Fatty liver disease.  Diverticulosis without evidence of typhlitis.  Aortic atherosclerosis  INTERVAL HISTORY Edward Pitts is a 60 y.o. male who has above history reviewed by me today presents for follow up visit for management of inguinal nodal recurrence of melanoma Patient reports that he is doing clinically well.  No new complaints.  Denies any shortness of breath, diarrhea. Chronic left lower extremity lymphedema, he wears compression stocking.  Symptoms are stable.   . :Review of Systems  Constitutional:  Negative for appetite change, chills, fatigue, fever and unexpected weight change.  HENT:   Negative for hearing loss and voice change.   Eyes:  Negative for eye problems and icterus ().  Respiratory:  Negative for chest tightness, cough and shortness of breath.   Cardiovascular:  Negative for chest pain and leg swelling.  Gastrointestinal:  Negative for abdominal distention and abdominal pain.  Endocrine: Negative for hot flashes.  Genitourinary:  Negative for difficulty urinating, dysuria and frequency.   Musculoskeletal:        Status post left hip replacement  Skin:  Negative for itching and rash.       Skin hypo-pigmentation on upper  extremities, no change  Neurological:  Negative for light-headedness and numbness.  Hematological:  Negative for adenopathy. Does not bruise/bleed easily.   Psychiatric/Behavioral:  Negative for confusion.    MEDICAL HISTORY:  Past Medical History:  Diagnosis Date   Anemia    iron treatments   Anxiety    Aortic atherosclerosis (HCC)    Arthritis    Cancer of groin ([place]) 2021   left groin, resected, radiation   Cataract    Complication of anesthesia    PONV   Coronary artery disease    Dizziness of unknown etiology    has led to seizures and passing out.   Family history of adverse reaction to anesthesia    PONV mother   GERD (gastroesophageal reflux disease)    History of complete heart block    PPM placed   Hyperlipidemia    Hypertension    LBBB (left bundle branch block)    Lymphedema of left leg    uses thigh high compression stockings   Melanoma ([place]) 2012   skin cancer, left thigh   OSA on CPAP    PONV (postoperative nausea and vomiting) 04/16/2019   Port-A-Cath in place    RIGHT chest wall   Presence of cardiac pacemaker    [employer]   Seizures ([place])    still has episodes of dizziness. last event 1 month ago (march 2022) and will pass out. takes clonazepam    SURGICAL HISTORY: Past Surgical History:  Procedure Laterality Date   CT RADIATION THERAPY GUIDE     left groin   dental implant     permanent implant   KNEE SURGERY Left    arthroscopy   LEFT HEART CATH AND CORONARY ANGIOGRAPHY Left 06/29/2017   Procedure: LEFT HEART CATH AND CORONARY ANGIOGRAPHY;  Surgeon: [name], MD;  Location: [place] [facility];  Service: Cardiovascular;  Laterality: Left;   LYMPH NODE DISSECTION Left 04/16/2019   Procedure: Left inguinal Lymph Node Dissection;  Surgeon: [name], MD;  Location: [place];  Service: General;  Laterality: Left;   MELANOMA EXCISION Left 04/16/2019   Procedure: MELANOMA EXCISION LEFT GROIN MASS;  Surgeon: [name], MD;  Location: [place];  Service: General;  Laterality: Left;   MELANOMA EXCISION WITH SENTINEL LYMPH NODE BIOPSY Left 2012   Left calf    PACEMAKER INSERTION N/A 08/26/2018    Procedure: INSERTION PACEMAKER;  Surgeon: [name], MD;  Location: [facility];  Service: Cardiovascular;  Laterality: N/A;   PORTA CATH INSERTION N/A 08/26/2019   Procedure: PORTA CATH INSERTION;  Surgeon: Katha Cabal, MD;  Location: [place] [facility];  Service: Cardiovascular;  Laterality: N/A;   SUPERFICIAL LYMPH NODE BIOPSY / EXCISION Left 2020   lymph nodes removed around left groin melanoma site   TEMPORARY PACEMAKER N/A 08/25/2018   Procedure: TEMPORARY PACEMAKER;  Surgeon: [name], MD;  Location: [place] [facility];  Service: Cardiovascular;  Laterality: N/A;   TOTAL HIP ARTHROPLASTY Left 07/14/2020   Procedure: TOTAL HIP ARTHROPLASTY;  Surgeon: Dereck Leep, MD;  Location: [facility];  Service: Orthopedics;  Laterality: Left;    SOCIAL HISTORY: Social History   Socioeconomic History   Marital status: Married    Spouse name: Vicente Males    Number of children: 7   Years of education: 12   Highest education level: Not on file  Occupational History    Comment: disability  Tobacco Use   Smoking status: Never   Smokeless tobacco: Never  Vaping  Use   Vaping Use: Never used  Substance and Sexual Activity   Alcohol use: No   Drug use: No   Sexual activity: Not Currently  Other Topics Concern   Not on file  Social History Narrative   Lives with  Wife,   Has 2 small dogs   Caffeine use: sodas (2 per day)      Out of work on disability.  Has a walk in shower. No stairs to climb   Oncology treatment ongoing. Uses port a cath for treatment.      pacemaker   Social Determinants of Health   Financial Resource Strain: Not on file  Food Insecurity: Not on file  Transportation Needs: Not on file  Physical Activity: Not on file  Stress: Not on file  Social Connections: Not on file  Intimate Partner Violence: Not on file    FAMILY HISTORY: Family History  Problem Relation Age of Onset   Cancer Paternal Grandmother     ALLERGIES:  is allergic to  ibuprofen and nsaids.  MEDICATIONS:  Current Outpatient Medications  Medication Sig Dispense Refill   allopurinol (ZYLOPRIM) 300 MG tablet Take 300 mg by mouth daily.     ASPIRIN 81 PO Take 1 tablet by mouth daily.     atorvastatin (LIPITOR) 20 MG tablet Take 20 mg by mouth daily.     clonazePAM (KLONOPIN) 0.5 MG tablet 1 tablet in the morning, 2 in the evening 90 tablet 3   hydrocortisone 2.5 % ointment Apply topically 2 (two) times daily.     ketoconazole (NIZORAL) 2 % cream Apply 1 application topically daily.     levETIRAcetam (KEPPRA) 250 MG tablet One tablet twice a day for 2 weeks, then take 2 twice a day 120 tablet 3   lidocaine-prilocaine (EMLA) cream Apply 1 application topically as needed. Apply small amount of cream to port site approx 1-2 hours prior to appointment. 30 g 2   lisinopril (ZESTRIL) 20 MG tablet Take 20 mg by mouth daily.     metoprolol succinate (TOPROL-XL) 25 MG 24 hr tablet Take 25 mg by mouth daily.      Multiple Vitamin (MULTIVITAMIN WITH MINERALS) TABS tablet Take 1 tablet by mouth daily. Centrum Silver     nystatin (MYCOSTATIN/NYSTOP) powder Apply 1 application topically 3 (three) times daily. Apply small amount to affected area 3 times a day until healed 60 g 2   omeprazole (PRILOSEC) 20 MG capsule TAKE 1 CAPSULE BY MOUTH EVERY DAY 30 capsule 1   ondansetron (ZOFRAN) 4 MG tablet TAKE 1 TABLET BY MOUTH EVERY 8 HOURS AS NEEDED FOR NAUSEA AND VOMITING 90 tablet 1   oxyCODONE (OXY IR/ROXICODONE) 5 MG immediate release tablet Take 1 tablet (5 mg total) by mouth every 4 (four) hours as needed for moderate pain (pain score 4-6). (Patient not taking: No sig reported) 30 tablet 0   No current facility-administered medications for this visit.     PHYSICAL EXAMINATION: ECOG PERFORMANCE STATUS: 1 - Symptomatic but completely ambulatory Vitals:   12/28/20 1015  BP: (!) 125/52  Pulse: 78  Resp: 18  Temp: 98.5 F (36.9 C)   Filed Weights   12/28/20 1015   Weight: 243 lb 9.6 oz (110.5 kg)    Physical Exam Constitutional:      General: He is not in acute distress.    Comments: Patient ambulates independently  HENT:     Head: Normocephalic and atraumatic.  Eyes:     General: No scleral icterus.  Pupils: Pupils are equal, round, and reactive to light.  Cardiovascular:     Rate and Rhythm: Normal rate and regular rhythm.     Heart sounds: Normal heart sounds.  Pulmonary:     Effort: Pulmonary effort is normal. No respiratory distress.     Breath sounds: No wheezing.  Abdominal:     General: Bowel sounds are normal. There is no distension.     Palpations: Abdomen is soft. There is no mass.     Tenderness: There is no abdominal tenderness.     Comments:    Musculoskeletal:        General: No deformity. Normal range of motion.     Cervical back: Normal range of motion and neck supple.     Comments: Left lower extremity edema Left hip replacement  Skin:    General: Skin is warm and dry.  Neurological:     Mental Status: He is alert and oriented to person, place, and time. Mental status is at baseline.     Cranial Nerves: No cranial nerve deficit.     Coordination: Coordination normal.  Psychiatric:        Mood and Affect: Mood normal.    LABORATORY DATA:  I have reviewed the data as listed Lab Results  Component Value Date   WBC 4.9 12/28/2020   HGB 11.0 (L) 12/28/2020   HCT 33.1 (L) 12/28/2020   MCV 90.7 12/28/2020   PLT 174 12/28/2020   Recent Labs    11/30/20 0954 12/14/20 1003 12/28/20 0936  NA 138 137 137  K 4.1 4.7 3.9  CL 107 105 106  CO2 $Re'24 25 24  'bwa$ GLUCOSE 133* 104* 143*  BUN 32* 27* 21  CREATININE 1.25* 1.10 1.07  CALCIUM 8.8* 9.3 8.8*  GFRNONAA >60 >60 >60  PROT 7.3 7.3 7.3  ALBUMIN 4.0 4.1 4.1  AST 27 33 33  ALT 34 39 40  ALKPHOS 90 92 94  BILITOT 0.3 0.8 0.7    Iron/TIBC/Ferritin/ %Sat    Component Value Date/Time   IRON 85 10/02/2019 0841   TIBC 342 10/02/2019 0841   FERRITIN 61  10/02/2019 0841   IRONPCTSAT 25 10/02/2019 0841      RADIOGRAPHIC STUDIES: I have personally reviewed the radiological images as listed and agreed with the findings in the report. DG Fluoro Guide CV Line Right  Result Date: 10/05/2020 INDICATION: Right IJ port catheter, no blood return EXAM: FLUOROSCOPIC RIGHT IJ POWER PORT CATHETER INJECTION MEDICATIONS: NONE. ANESTHESIA/SEDATION: NONE. FLUOROSCOPY TIME:  Fluoroscopy Time: 0 minutes 48 seconds (15 mGy). COMPLICATIONS: None immediate. PROCEDURE: Informed written consent was obtained from the patient after a thorough discussion of the procedural risks, benefits and alternatives. All questions were addressed. Maximal Sterile Barrier Technique was utilized including caps, mask, sterile gowns, sterile gloves, sterile drape, hand hygiene and skin antiseptic. A timeout was performed prior to the initiation of the procedure. Under sterile conditions, the existing accessed right IJ power port catheter was injected with contrast. Fluoroscopic imaging performed. Port catheter is intact and patent. Tip in the SVC RA junction. At the catheter tip, contrast does not flow freely from the tip compatible with a small amount of catheter tip nonocclusive thrombus or fibrin sheath. This likely accounts for the lack of blood return. Catheter flushed with saline in the appropriate volume of heparin. Needle removed. IMPRESSION: Trace amount of thrombus or fibrin sheath at the port catheter tip. Electronically Signed   By: Jerilynn Mages.  Shick M.D.   On: 10/05/2020 12:31  CT CHEST ABDOMEN PELVIS WO CONTRAST  Result Date: 10/21/2020 CLINICAL DATA:  History of melanoma diagnosed in 2012 with Mets to left inguinal area in October 2020 status post surgical resection in January 2021. EXAM: CT CHEST, ABDOMEN AND PELVIS WITHOUT CONTRAST TECHNIQUE: Multidetector CT imaging of the chest, abdomen and pelvis was performed following the standard protocol without IV contrast. COMPARISON:  Multiple  prior is including most recent CT June 29, 2020. FINDINGS: CT CHEST FINDINGS Cardiovascular: Right chest wall Port-A-Cath with tip at the superior cavoatrial junction. Left chest multi lead pacemaker. Aortic atherosclerosis without aneurysmal dilation. Normal caliber central pulmonary arteries. Left coronary artery calcifications. No significant pericardial effusion/thickening. Mediastinum/Nodes: No discrete thyroid nodule. No pathologically enlarged mediastinal, hilar or axillary lymph nodes, noting limited sensitivity for the detection of hilar adenopathy on this noncontrast study. The trachea and esophagus are grossly unremarkable. Lungs/Pleura: No suspicious pulmonary nodules or masses. No pleural effusion. No pneumothorax. Musculoskeletal: No aggressive lytic or blastic lesion of bone. No chest wall mass. CT ABDOMEN PELVIS FINDINGS Hepatobiliary: Hepatic steatosis, otherwise unremarkable noncontrast appearance of the hepatic parenchyma. Gallbladder is unremarkable. No biliary ductal dilation. Pancreas: Within normal limits. Spleen: Within normal limits. Adrenals/Urinary Tract: Bilateral adrenal glands are unremarkable. Right lower pole renal cyst. No hydronephrosis. No nephrolithiasis. Urinary bladder is unremarkable for degree of distension. Stomach/Bowel: Stomach is within normal limits. Colonic diverticulosis without findings of acute diverticulitis. No evidence of bowel wall thickening, distention, or inflammatory changes. Vascular/Lymphatic: Aortic atherosclerosis without aneurysmal dilation. No pathologically enlarged abdominal or pelvic lymph nodes. Reproductive: Prostate is unremarkable. Other: Similar findings of left inguinal lymph node resection with overlying skin thickening and subcutaneous stranding in keeping with radiation therapy. Musculoskeletal: Changes of interval left hip arthroplasty. Right hip degenerative change. Degenerative changes spine. No aggressive lytic or blastic lesion of bone.  IMPRESSION: 1. Stable postoperative/post radiation appearance of the left groin. No evidence of local recurrence or metastatic disease within the chest, abdomen, or pelvis. 2. Hepatic steatosis. 3. Colonic diverticulosis without findings of acute diverticulitis. 4. Changes of interval left hip arthroplasty. 5.  Aortic Atherosclerosis (ICD10-I70.0). Electronically Signed   By: Dahlia Bailiff MD   On: 10/21/2020 11:19        ASSESSMENT & PLAN:  1. Malignant melanoma of overlapping sites ([place])   2. Encounter for antineoplastic immunotherapy   3. Vitiligo   4. Anemia, unspecified type    #Left inguinal nodal recurrence of melanoma.  Status post resection and adjuvant radiation. Stage IV Recurrent, now in NED Labs are reviewed and discussed with patient. Proceed with nivolumab treatment today.   #Erythematous changes of left inguinal area, improved with nystatin powder 4 times daily as needed.  Continue  #Vitiligo, stable #Normocytic anemia, hemoglobin is at 11.  Monitor. Follow-up 2 weeks at [facility] treatment. We spent sufficient time to discuss many aspect of care, questions were answered to patient's satisfaction.   [name], MD, PhD Hematology Oncology [place] at [facility]  12/28/2020

## 2020-12-28 NOTE — Patient Instructions (Signed)
[facility] [place] [facility]  Discharge Instructions: Thank you for choosing [facility] to provide your oncology and hematology care.  If you have a lab appointment with the Cancer Center, please go directly to the Cancer Center and check in at the registration area.  Wear comfortable clothing and clothing appropriate for easy access to any Portacath or PICC line.   We strive to give you quality time with your provider. You may need to reschedule your appointment if you arrive late (15 or more minutes).  Arriving late affects you and other patients whose appointments are after yours.  Also, if you miss three or more appointments without notifying the office, you may be dismissed from the clinic at the provider's discretion.      For prescription refill requests, have your pharmacy contact our office and allow 72 hours for refills to be completed.      To help prevent nausea and vomiting after your treatment, we encourage you to take your nausea medication as directed.  BELOW ARE SYMPTOMS THAT SHOULD BE REPORTED IMMEDIATELY: *FEVER GREATER THAN 100.4 F (38 C) OR HIGHER *CHILLS OR SWEATING *NAUSEA AND VOMITING THAT IS NOT CONTROLLED WITH YOUR NAUSEA MEDICATION *UNUSUAL SHORTNESS OF BREATH *UNUSUAL BRUISING OR BLEEDING *URINARY PROBLEMS (pain or burning when urinating, or frequent urination) *BOWEL PROBLEMS (unusual diarrhea, constipation, pain near the anus) TENDERNESS IN MOUTH AND THROAT WITH OR WITHOUT PRESENCE OF ULCERS (sore throat, sores in mouth, or a toothache) UNUSUAL RASH, SWELLING OR PAIN  UNUSUAL VAGINAL DISCHARGE OR ITCHING   Items with * indicate a potential emergency and should be followed up as soon as possible or go to the Emergency Department if any problems should occur.  Please show the CHEMOTHERAPY ALERT CARD or IMMUNOTHERAPY ALERT CARD at check-in to the Emergency Department and triage nurse.  Should you have questions after your  visit or need to cancel or reschedule your appointment, please contact [facility] [place] [facility]  [phone] and follow the prompts.  Office hours are 8:00 a.m. to 4:30 p.m. Monday - Friday. Please note that voicemails left after 4:00 p.m. may not be returned until the following business day.  We are closed weekends and major holidays. You have access to a nurse at all times for urgent questions. Please call the main number to the clinic [phone] and follow the prompts.  For any non-urgent questions, you may also contact your provider using MyChart. We now offer e-Visits for anyone 18 and older to request care online for non-urgent symptoms. For details visit [email or web address].   Also download the MyChart app! Go to the app store, search "MyChart", open the app, select [place], and log in with your MyChart username and password.  Due to Covid, a mask is required upon entering the hospital/clinic. If you do not have a mask, one will be given to you upon arrival. For doctor visits, patients may have 1 support person aged 18 or older with them. For treatment visits, patients cannot have anyone with them due to current Covid guidelines and our immunocompromised population.  

## 2021-01-09 ENCOUNTER — Other Ambulatory Visit: Payer: Self-pay | Admitting: Neurology

## 2021-01-11 ENCOUNTER — Other Ambulatory Visit: Payer: Self-pay

## 2021-01-11 ENCOUNTER — Encounter: Payer: Self-pay | Admitting: Oncology

## 2021-01-11 ENCOUNTER — Telehealth: Payer: Self-pay | Admitting: Neurology

## 2021-01-11 ENCOUNTER — Inpatient Hospital Stay

## 2021-01-11 ENCOUNTER — Inpatient Hospital Stay (HOSPITAL_BASED_OUTPATIENT_CLINIC_OR_DEPARTMENT_OTHER): Admitting: Oncology

## 2021-01-11 VITALS — BP 126/75 | HR 76 | Temp 97.2°F | Wt 243.5 lb

## 2021-01-11 DIAGNOSIS — L80 Vitiligo: Secondary | ICD-10-CM | POA: Diagnosis not present

## 2021-01-11 DIAGNOSIS — R7989 Other specified abnormal findings of blood chemistry: Secondary | ICD-10-CM

## 2021-01-11 DIAGNOSIS — C438 Malignant melanoma of overlapping sites of skin: Secondary | ICD-10-CM | POA: Diagnosis not present

## 2021-01-11 DIAGNOSIS — Z5112 Encounter for antineoplastic immunotherapy: Secondary | ICD-10-CM

## 2021-01-11 LAB — COMPREHENSIVE METABOLIC PANEL
ALT: 40 U/L (ref 0–44)
AST: 31 U/L (ref 15–41)
Albumin: 4.2 g/dL (ref 3.5–5.0)
Alkaline Phosphatase: 97 U/L (ref 38–126)
Anion gap: 7 (ref 5–15)
BUN: 40 mg/dL — ABNORMAL HIGH (ref 8–23)
CO2: 23 mmol/L (ref 22–32)
Calcium: 9.2 mg/dL (ref 8.9–10.3)
Chloride: 107 mmol/L (ref 98–111)
Creatinine, Ser: 1.44 mg/dL — ABNORMAL HIGH (ref 0.61–1.24)
GFR, Estimated: 55 mL/min — ABNORMAL LOW (ref >60)
Glucose, Bld: 121 mg/dL — ABNORMAL HIGH (ref 70–99)
Potassium: 4.1 mmol/L (ref 3.5–5.1)
Sodium: 137 mmol/L (ref 135–145)
Total Bilirubin: 0.3 mg/dL (ref 0.3–1.2)
Total Protein: 7.5 g/dL (ref 6.5–8.1)

## 2021-01-11 LAB — CBC WITH DIFFERENTIAL/PLATELET
Abs Immature Granulocytes: 0.06 10*3/uL (ref 0.00–0.07)
Basophils Absolute: 0 10*3/uL (ref 0.0–0.1)
Basophils Relative: 1 %
Eosinophils Absolute: 0.2 10*3/uL (ref 0.0–0.5)
Eosinophils Relative: 4 %
HCT: 33 % — ABNORMAL LOW (ref 39.0–52.0)
Hemoglobin: 11.5 g/dL — ABNORMAL LOW (ref 13.0–17.0)
Immature Granulocytes: 1 %
Lymphocytes Relative: 24 %
Lymphs Abs: 1.2 10*3/uL (ref 0.7–4.0)
MCH: 31.2 pg (ref 26.0–34.0)
MCHC: 34.8 g/dL (ref 30.0–36.0)
MCV: 89.4 fL (ref 80.0–100.0)
Monocytes Absolute: 0.5 10*3/uL (ref 0.1–1.0)
Monocytes Relative: 9 %
Neutro Abs: 3.2 10*3/uL (ref 1.7–7.7)
Neutrophils Relative %: 61 %
Platelets: 178 10*3/uL (ref 150–400)
RBC: 3.69 MIL/uL — ABNORMAL LOW (ref 4.22–5.81)
RDW: 13.6 % (ref 11.5–15.5)
WBC: 5.1 10*3/uL (ref 4.0–10.5)
nRBC: 0 % (ref 0.0–0.2)

## 2021-01-11 MED ORDER — HEPARIN SOD (PORK) LOCK FLUSH 100 UNIT/ML IV SOLN
INTRAVENOUS | Status: AC
  Administered 2021-01-11: 500 [IU] via INTRAVENOUS
  Filled 2021-01-11: qty 5

## 2021-01-11 MED ORDER — SODIUM CHLORIDE 0.9 % IV SOLN
Freq: Once | INTRAVENOUS | Status: AC
  Filled 2021-01-11: qty 250

## 2021-01-11 MED ORDER — HEPARIN SOD (PORK) LOCK FLUSH 100 UNIT/ML IV SOLN
500.0000 [IU] | Freq: Once | INTRAVENOUS | Status: AC
  Filled 2021-01-11: qty 5

## 2021-01-11 NOTE — Telephone Encounter (Signed)
I called the patient.  He has had worsening of renal function recently, he also reports that when he takes the [place] it makes him dizzy.  I do not believe that the Keppra has any impact on renal function, but if he does not tolerate the drug, we will taper him off the medication.  He will go to 250 mg twice daily for 2 weeks and then stop the drug.

## 2021-01-11 NOTE — Telephone Encounter (Signed)
Pt called, having a reaction to Keppra, dizziness, elevated kidney function. During visit with cancer physician she told me kidney function was elevated. She said, contact your neurologist to see if he want you to discontinue taking Keppra. Would like a call from the nurse to discuss discontinuing the medication

## 2021-01-11 NOTE — Progress Notes (Signed)
Hematology/Oncology follow up  note [facility] [phone] [phone]   Patient Care Team: Mechele Claude, FNP as PCP - General (Family Medicine) [name], MD as Consulting Physician (Oncology)  REFERRING PROVIDER: Mechele Claude, FNP  CHIEF COMPLAINTS/REASON FOR VISIT:  Follow up for melanoma HISTORY OF PRESENTING ILLNESS:   Edward Pitts is a  60 y.o.  male with PMH listed below was seen in consultation at the request of  Mechele Claude, FNP  for evaluation of inguinal mass Patient presented to emergency room 3 days ago complaining about left ing uinal mass discomfort. Reports that he has really noticed the mass growing for the past 1 months. He has a history of left lower extremity melanoma in 2011, status post local excision.  Pain was increased with squatting of laxation. He was advised to take Tylenol for pain. Denies any fever, chills, night sweating.  He does feel mild nauseated. Appetite is fair.  He has lost about 10 pounds since earlier this year. 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  Left inguinal and left iliac nodes which are suspicious for involvement.  There are also 2 small nonspecific hypodense lesions within the right liver, nonspecific.  # patient underwent left groin mass resection on 04/16/2019. Resection pathology showed malignant melanoma, replacing a lymph node, with extracapsular extension, peripheral and deep margins involved.  Left inguinal contents, all 7 lymph nodes were negative for melanoma in the lymph nodes.  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  # PDL1 80% TPS  # 04/16/2019. underwent left groin mass resection   07/07/2019  Status post adjuvant radiation and finished radiation  Patient has Mediport placed to facilitate immunotherapy  treatments.  06/29/2020, CT chest abdomen pelvis showed stable postoperative appearance of the left groin.  No evidence of local recurrence.  No evidence of metastatic disease in the chest abdomen or pelvis.  Hepatic steatosis.  Stable subcentimeter fluid attenuation lesion of the lateral right lobe of the liver, likely benign cyst or hemangioma.  Coronary artery disease.  Aortic atherosclerosis  10/20/2020, CT chest abdomen pelvis showed stable postoperative/radiation appearance of the left groin.  No evidence of local recurrence/metastatic disease within the chest abdomen/pelvis.  Fatty liver disease.  Diverticulosis without evidence of typhlitis.  Aortic atherosclerosis  INTERVAL HISTORY Edward Pitts is a 61 y.o. male who has above history reviewed by me today presents for follow up visit for management of inguinal nodal recurrence of melanoma Patient was recently seen by neurology and was started on Keppra for treatment of recurrent spells.  Patient denies any loss of consciousness.  He feels like he is in a daydream when this Happens.  He reports not tolerating Keppra due to dizziness.  Denies any nausea vomiting diarrhea.  . :Review of Systems  Constitutional:  Negative for appetite change, chills, fatigue, fever and unexpected weight change.  HENT:   Negative for hearing loss and voice change.   Eyes:  Negative for eye problems and icterus.  Respiratory:  Negative for chest tightness, cough and shortness of breath.   Cardiovascular:  Negative for chest pain and leg swelling.  Gastrointestinal:  Negative for abdominal distention and abdominal pain.  Endocrine: Negative for hot flashes.  Genitourinary:  Negative for difficulty urinating, dysuria and frequency.   Musculoskeletal:        Status post left hip replacement  Skin:  Negative for itching and rash.       Skin hypo-pigmentation on upper extremities, no change  Neurological:  Negative for light-headedness and numbness.   Hematological:  Negative for adenopathy. Does not bruise/bleed easily.  Psychiatric/Behavioral:  Negative for confusion.    MEDICAL HISTORY:  Past Medical History:  Diagnosis Date   Anemia    iron treatments   Anxiety    Aortic atherosclerosis (HCC)    Arthritis    Cancer of groin ([place]) 2021   left groin, resected, radiation   Cataract    Complication of anesthesia    PONV   Coronary artery disease    Dizziness of unknown etiology    has led to seizures and passing out.   Family history of adverse reaction to anesthesia    PONV mother   GERD (gastroesophageal reflux disease)    History of complete heart block    PPM placed   Hyperlipidemia    Hypertension    LBBB (left bundle branch block)    Lymphedema of left leg    uses thigh high compression stockings   Melanoma ([place]) 2012   skin cancer, left thigh   OSA on CPAP    PONV (postoperative nausea and vomiting) 04/16/2019   Port-A-Cath in place    RIGHT chest wall   Presence of cardiac pacemaker    [employer]   Seizures ([place])    still has episodes of dizziness. last event 1 month ago (march 2022) and will pass out. takes clonazepam    SURGICAL HISTORY: Past Surgical History:  Procedure Laterality Date   CT RADIATION THERAPY GUIDE     left groin   dental implant     permanent implant   KNEE SURGERY Left    arthroscopy   LEFT HEART CATH AND CORONARY ANGIOGRAPHY Left 06/29/2017   Procedure: LEFT HEART CATH AND CORONARY ANGIOGRAPHY;  Surgeon: [name], MD;  Location: [facility];  Service: Cardiovascular;  Laterality: Left;   LYMPH NODE DISSECTION Left 04/16/2019   Procedure: Left inguinal Lymph Node Dissection;  Surgeon: [name], MD;  Location: [facility];  Service: General;  Laterality: Left;   MELANOMA EXCISION Left 04/16/2019   Procedure: MELANOMA EXCISION LEFT GROIN MASS;  Surgeon: [name], MD;  Location: [facility];  Service: General;  Laterality: Left;   MELANOMA EXCISION WITH SENTINEL LYMPH  NODE BIOPSY Left 2012   Left calf    PACEMAKER INSERTION N/A 08/26/2018   Procedure: INSERTION PACEMAKER;  Surgeon: [name], MD;  Location: [facility];  Service: Cardiovascular;  Laterality: N/A;   PORTA CATH INSERTION N/A 08/26/2019   Procedure: PORTA CATH INSERTION;  Surgeon: Katha Cabal, MD;  Location: [facility];  Service: Cardiovascular;  Laterality: N/A;   SUPERFICIAL LYMPH NODE BIOPSY / EXCISION Left 2020   lymph nodes removed around left groin melanoma site   TEMPORARY PACEMAKER N/A 08/25/2018   Procedure: TEMPORARY PACEMAKER;  Surgeon: [name], MD;  Location: [facility];  Service: Cardiovascular;  Laterality: N/A;   TOTAL HIP ARTHROPLASTY Left 07/14/2020   Procedure: TOTAL HIP ARTHROPLASTY;  Surgeon: Dereck Leep, MD;  Location: [facility];  Service: Orthopedics;  Laterality: Left;    SOCIAL HISTORY: Social History   Socioeconomic History   Marital status: Married    Spouse name: Vicente Males    Number of children: 7   Years of education: 12   Highest education level: Not on file  Occupational History    Comment: disability  Tobacco Use   Smoking status: Never   Smokeless tobacco: Never  Vaping Use   Vaping Use: Never used  Substance and Sexual Activity   Alcohol use: No   Drug use: No   Sexual activity: Not Currently  Other Topics Concern   Not on file  Social History Narrative   Lives with  Wife,   Has 2 small dogs   Caffeine use: sodas (2 per day)      Out of work on disability.  Has a walk in shower. No stairs to climb   Oncology treatment ongoing. Uses port a cath for treatment.      pacemaker   Social Determinants of Health   Financial Resource Strain: Not on file  Food Insecurity: Not on file  Transportation Needs: Not on file  Physical Activity: Not on file  Stress: Not on file  Social Connections: Not on file  Intimate Partner Violence: Not on file    FAMILY HISTORY: Family History  Problem Relation Age of  Onset   Cancer Paternal Grandmother     ALLERGIES:  is allergic to ibuprofen and nsaids.  MEDICATIONS:  Current Outpatient Medications  Medication Sig Dispense Refill   allopurinol (ZYLOPRIM) 300 MG tablet Take 300 mg by mouth daily.     atorvastatin (LIPITOR) 20 MG tablet Take 20 mg by mouth daily.     clonazePAM (KLONOPIN) 0.5 MG tablet 1 tablet in the morning, 2 in the evening 90 tablet 3   hydrocortisone 2.5 % ointment Apply topically 2 (two) times daily.     ketoconazole (NIZORAL) 2 % cream Apply 1 application topically daily.     levETIRAcetam (KEPPRA) 250 MG tablet TAKE 2 TWICE A DAY 360 tablet 1   lidocaine-prilocaine (EMLA) cream Apply 1 application topically as needed. Apply small amount of cream to port site approx 1-2 hours prior to appointment. 30 g 2   lisinopril (ZESTRIL) 20 MG tablet Take 20 mg by mouth daily.     metoprolol succinate (TOPROL-XL) 25 MG 24 hr tablet Take 25 mg by mouth daily.      Multiple Vitamin (MULTIVITAMIN WITH MINERALS) TABS tablet Take 1 tablet by mouth daily. Centrum Silver     nystatin (MYCOSTATIN/NYSTOP) powder Apply 1 application topically 3 (three) times daily. Apply small amount to affected area 3 times a day until healed 60 g 2   omeprazole (PRILOSEC) 20 MG capsule TAKE 1 CAPSULE BY MOUTH EVERY DAY 30 capsule 1   ondansetron (ZOFRAN) 4 MG tablet TAKE 1 TABLET BY MOUTH EVERY 8 HOURS AS NEEDED FOR NAUSEA AND VOMITING 90 tablet 1   ASPIRIN 81 PO Take 1 tablet by mouth daily.     oxyCODONE (OXY IR/ROXICODONE) 5 MG immediate release tablet Take 1 tablet (5 mg total) by mouth every 4 (four) hours as needed for moderate pain (pain score 4-6). (Patient not taking: No sig reported) 30 tablet 0   No current facility-administered medications for this visit.     PHYSICAL EXAMINATION: ECOG PERFORMANCE STATUS: 1 - Symptomatic but completely ambulatory Vitals:   01/11/21 1002  BP: 126/75  Pulse: 76  Temp: (!) 97.2 F (36.2 C)   Filed Weights    01/11/21 1002  Weight: 243 lb 8 oz (110.5 kg)    Physical Exam Constitutional:      General: He is not in acute distress.    Comments: Patient ambulates independently  HENT:     Head: Normocephalic and atraumatic.  Eyes:     General: No  scleral icterus.    Pupils: Pupils are equal, round, and reactive to light.  Cardiovascular:     Rate and Rhythm: Normal rate and regular rhythm.     Heart sounds: Normal heart sounds.  Pulmonary:     Effort: Pulmonary effort is normal. No respiratory distress.     Breath sounds: No wheezing.  Abdominal:     General: Bowel sounds are normal. There is no distension.     Palpations: Abdomen is soft. There is no mass.     Tenderness: There is no abdominal tenderness.     Comments:    Musculoskeletal:        General: No deformity. Normal range of motion.     Cervical back: Normal range of motion and neck supple.     Comments: Left lower extremity edema Left hip replacement  Skin:    General: Skin is warm and dry.  Neurological:     Mental Status: He is alert and oriented to person, place, and time. Mental status is at baseline.     Cranial Nerves: No cranial nerve deficit.     Coordination: Coordination normal.  Psychiatric:        Mood and Affect: Mood normal.    LABORATORY DATA:  I have reviewed the data as listed Lab Results  Component Value Date   WBC 5.1 01/11/2021   HGB 11.5 (L) 01/11/2021   HCT 33.0 (L) 01/11/2021   MCV 89.4 01/11/2021   PLT 178 01/11/2021   Recent Labs    12/14/20 1003 12/28/20 0936 01/11/21 0900  NA 137 137 137  K 4.7 3.9 4.1  CL 105 106 107  CO2 _0 GLUCOSE 104* 143* 121*  BUN 27* 21 40*  CREATININE 1.10 1.07 1.44*  CALCIUM 9.3 8.8* 9.2  GFRNONAA >60 >60 55*  PROT 7.3 7.3 7.5  ALBUMIN 4.1 4.1 4.2  AST 33 33 31  ALT 39 40 40  ALKPHOS 92 94 97  BILITOT 0.8 0.7 0.3    Iron/TIBC/Ferritin/ %Sat    Component Value Date/Time   IRON 85 10/02/2019 0841   TIBC 342 10/02/2019 0841    FERRITIN 61 10/02/2019 0841   IRONPCTSAT 25 10/02/2019 0841      RADIOGRAPHIC STUDIES: I have personally reviewed the radiological images as listed and agreed with the findings in the report. CT CHEST ABDOMEN PELVIS WO CONTRAST  Result Date: 10/21/2020 CLINICAL DATA:  History of melanoma diagnosed in 2012 with Mets to left inguinal area in October 2020 status post surgical resection in January 2021. EXAM: CT CHEST, ABDOMEN AND PELVIS WITHOUT CONTRAST TECHNIQUE: Multidetector CT imaging of the chest, abdomen and pelvis was performed following the standard protocol without IV contrast. COMPARISON:  Multiple prior is including most recent CT June 29, 2020. FINDINGS: CT CHEST FINDINGS Cardiovascular: Right chest wall Port-A-Cath with tip at the superior cavoatrial junction. Left chest multi lead pacemaker. Aortic atherosclerosis without aneurysmal dilation. Normal caliber central pulmonary arteries. Left coronary artery calcifications. No significant pericardial effusion/thickening. Mediastinum/Nodes: No discrete thyroid nodule. No pathologically enlarged mediastinal, hilar or axillary lymph nodes, noting limited sensitivity for the detection of hilar adenopathy on this noncontrast study. The trachea and esophagus are grossly unremarkable. Lungs/Pleura: No suspicious pulmonary nodules or masses. No pleural effusion. No pneumothorax. Musculoskeletal: No aggressive lytic or blastic lesion of bone. No chest wall mass. CT ABDOMEN PELVIS FINDINGS Hepatobiliary: Hepatic steatosis, otherwise unremarkable noncontrast appearance of the hepatic parenchyma. Gallbladder is unremarkable. No biliary ductal dilation. Pancreas: Within normal  limits. Spleen: Within normal limits. Adrenals/Urinary Tract: Bilateral adrenal glands are unremarkable. Right lower pole renal cyst. No hydronephrosis. No nephrolithiasis. Urinary bladder is unremarkable for degree of distension. Stomach/Bowel: Stomach is within normal limits. Colonic  diverticulosis without findings of acute diverticulitis. No evidence of bowel wall thickening, distention, or inflammatory changes. Vascular/Lymphatic: Aortic atherosclerosis without aneurysmal dilation. No pathologically enlarged abdominal or pelvic lymph nodes. Reproductive: Prostate is unremarkable. Other: Similar findings of left inguinal lymph node resection with overlying skin thickening and subcutaneous stranding in keeping with radiation therapy. Musculoskeletal: Changes of interval left hip arthroplasty. Right hip degenerative change. Degenerative changes spine. No aggressive lytic or blastic lesion of bone. IMPRESSION: 1. Stable postoperative/post radiation appearance of the left groin. No evidence of local recurrence or metastatic disease within the chest, abdomen, or pelvis. 2. Hepatic steatosis. 3. Colonic diverticulosis without findings of acute diverticulitis. 4. Changes of interval left hip arthroplasty. 5.  Aortic Atherosclerosis (ICD10-I70.0). Electronically Signed   By: Dahlia Bailiff MD   On: 10/21/2020 11:19        ASSESSMENT & PLAN:  1. Encounter for antineoplastic immunotherapy   2. Malignant melanoma of overlapping sites ([place])   3. Vitiligo   4. Elevated serum creatinine    #Left inguinal nodal recurrence of melanoma.  Status post resection and adjuvant radiation. Stage IV Recurrent, now in NED Labs are reviewed and discussed with patient. Hold Keytruda today due to elevated creatinine level.  Obtain CT chest abdomen pelvis   # Elevated creatinine level, recommend patient to increase oral hydration.  IVF 1L NS x 1.   #Vitiligo, stable #Normocytic anemia, hemoglobin is at 11.5  Monitor. # Spells, trial of Keppra he seems not tolerating. I recommend him to call his neurologist.  Obtain MRI brain.   Follow-up 2 weeks at [facility] treatment. We spent sufficient time to discuss many aspect of care, questions were answered to patient's satisfaction.   [name], MD,  PhD Hematology Oncology [place] at [facility]  01/11/2021

## 2021-01-11 NOTE — Progress Notes (Signed)
Pt received 1L of IVF in clinic today per order. No complaints at d/c.

## 2021-01-11 NOTE — Patient Instructions (Signed)
[facility] [place] [facility]  Discharge Instructions: Thank you for choosing [facility] to provide your oncology and hematology care.  If you have a lab appointment with the Cancer Center, please go directly to the Cancer Center and check in at the registration area.  Wear comfortable clothing and clothing appropriate for easy access to any Portacath or PICC line.   We strive to give you quality time with your provider. You may need to reschedule your appointment if you arrive late (15 or more minutes).  Arriving late affects you and other patients whose appointments are after yours.  Also, if you miss three or more appointments without notifying the office, you may be dismissed from the clinic at the provider's discretion.      For prescription refill requests, have your pharmacy contact our office and allow 72 hours for refills to be completed.      To help prevent nausea and vomiting after your treatment, we encourage you to take your nausea medication as directed.  BELOW ARE SYMPTOMS THAT SHOULD BE REPORTED IMMEDIATELY: *FEVER GREATER THAN 100.4 F (38 C) OR HIGHER *CHILLS OR SWEATING *NAUSEA AND VOMITING THAT IS NOT CONTROLLED WITH YOUR NAUSEA MEDICATION *UNUSUAL SHORTNESS OF BREATH *UNUSUAL BRUISING OR BLEEDING *URINARY PROBLEMS (pain or burning when urinating, or frequent urination) *BOWEL PROBLEMS (unusual diarrhea, constipation, pain near the anus) TENDERNESS IN MOUTH AND THROAT WITH OR WITHOUT PRESENCE OF ULCERS (sore throat, sores in mouth, or a toothache) UNUSUAL RASH, SWELLING OR PAIN  UNUSUAL VAGINAL DISCHARGE OR ITCHING   Items with * indicate a potential emergency and should be followed up as soon as possible or go to the Emergency Department if any problems should occur.  Please show the CHEMOTHERAPY ALERT CARD or IMMUNOTHERAPY ALERT CARD at check-in to the Emergency Department and triage nurse.  Should you have questions after your  visit or need to cancel or reschedule your appointment, please contact [facility]  [phone] and follow the prompts.  Office hours are 8:00 a.m. to 4:30 p.m. Monday - Friday. Please note that voicemails left after 4:00 p.m. may not be returned until the following business day.  We are closed weekends and major holidays. You have access to a nurse at all times for urgent questions. Please call the main number to the clinic [phone] and follow the prompts.  For any non-urgent questions, you may also contact your provider using MyChart. We now offer e-Visits for anyone 18 and older to request care online for non-urgent symptoms. For details visit [email or web address].   Also download the MyChart app! Go to the app store, search "MyChart", open the app, select [facility], and log in with your MyChart username and password.  Due to Covid, a mask is required upon entering the hospital/clinic. If you do not have a mask, one will be given to you upon arrival. For doctor visits, patients may have 1 support person aged 18 or older with them. For treatment visits, patients cannot have anyone with them due to current Covid guidelines and our immunocompromised population.  

## 2021-01-17 IMAGING — CR DG HIP (WITH OR WITHOUT PELVIS) 2-3V*L*
1 series · 3 of 3 positions shown · non-contrast
Comparison: None.

CLINICAL DATA: Left hip pain and stiffness, history of melanoma

EXAM:
DG HIP (WITH OR WITHOUT PELVIS) 2-3V LEFT

[Series 1: view not recorded · 0.14mm/px · 3 of 3 slices shown]
[im 1/3]
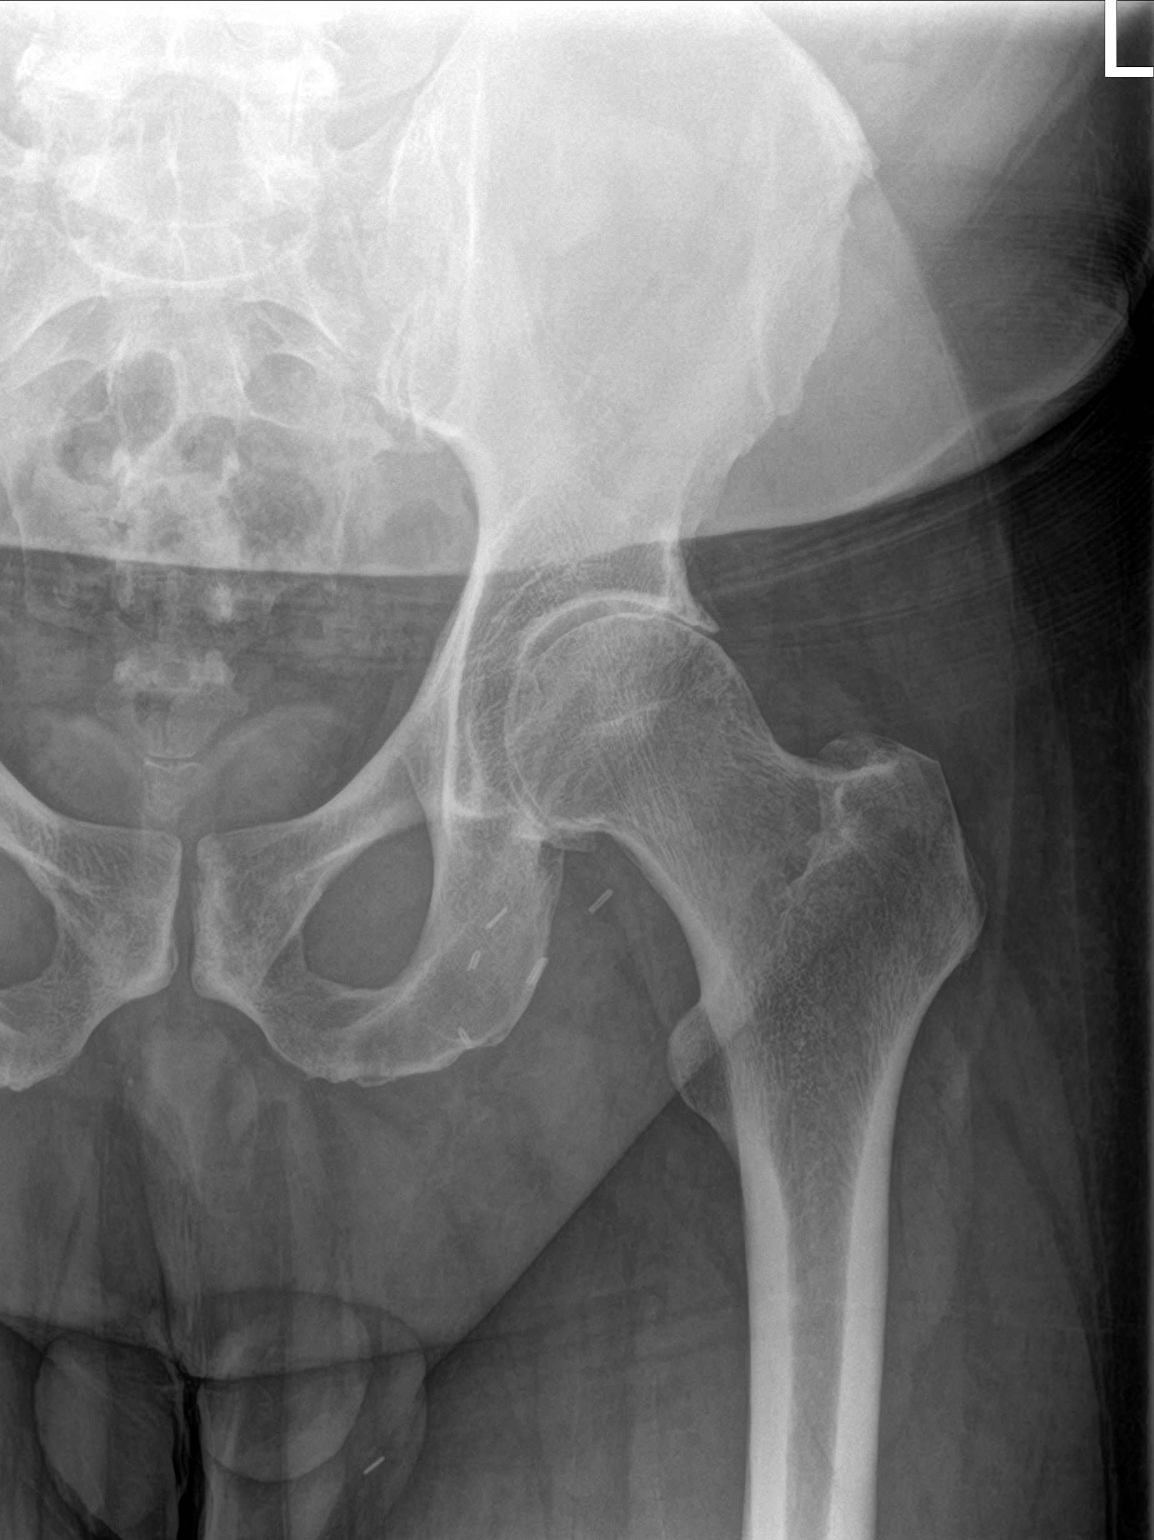
[im 2/3]
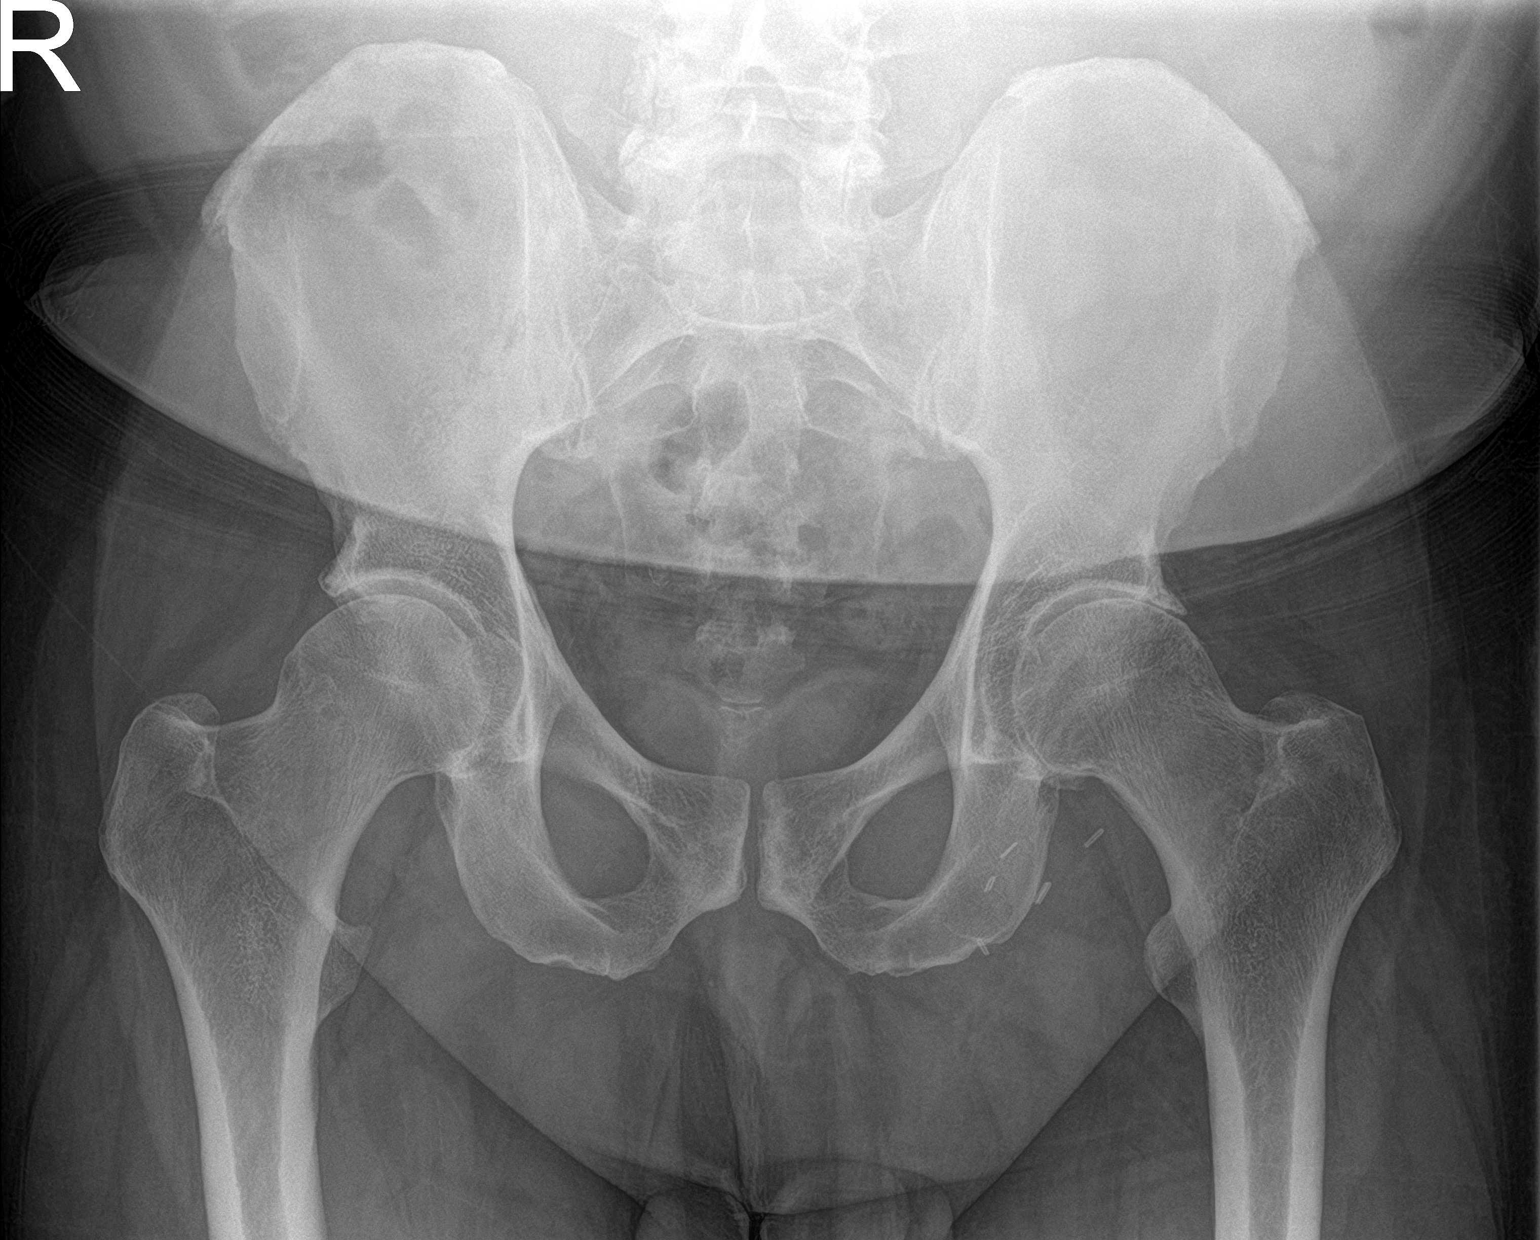
[im 3/3]
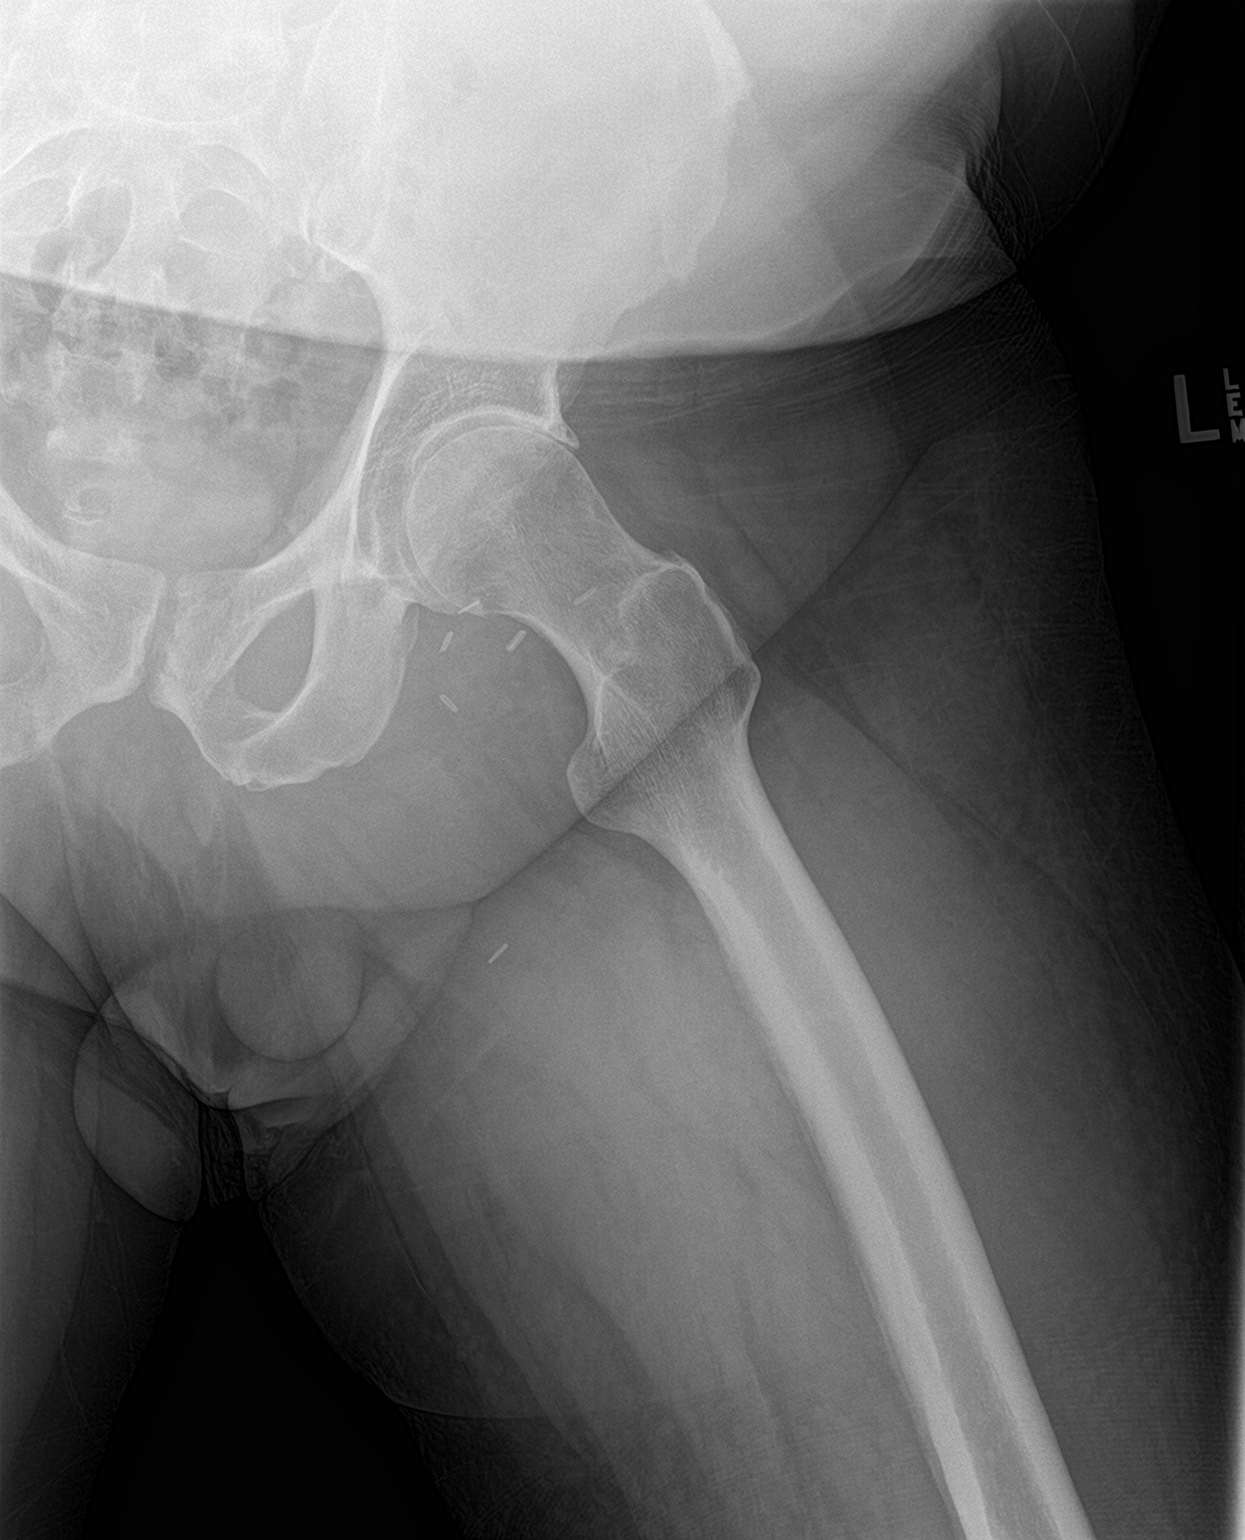

[3 of 3 positions shown; findings below may reference images not displayed]

FINDINGS: Frontal view of the pelvis as well as frontal and frogleg lateral
views of the left hip are obtained. No fracture, subluxation, or
dislocation. There is moderate left hip osteoarthritis with superior
joint space narrowing and osteophyte formation. Sacroiliac joints
are normal. Prominent facet hypertrophic changes at the lumbosacral
junction.
IMPRESSION: 1. Moderate left hip osteoarthritis.  No acute fracture.
2. Lower lumbar facet hypertrophy.

## 2021-01-18 ENCOUNTER — Ambulatory Visit
Admission: RE | Admit: 2021-01-18 | Discharge: 2021-01-18 | Disposition: A | Discharge status: Home / Self Care | Source: Ambulatory Visit | Attending: Oncology | Admitting: Oncology

## 2021-01-18 DIAGNOSIS — C438 Malignant melanoma of overlapping sites of skin: Secondary | ICD-10-CM | POA: Insufficient documentation

## 2021-01-19 ENCOUNTER — Ambulatory Visit

## 2021-01-25 ENCOUNTER — Inpatient Hospital Stay

## 2021-01-25 ENCOUNTER — Inpatient Hospital Stay: Attending: Oncology

## 2021-01-25 ENCOUNTER — Other Ambulatory Visit: Payer: Self-pay

## 2021-01-25 ENCOUNTER — Inpatient Hospital Stay (HOSPITAL_BASED_OUTPATIENT_CLINIC_OR_DEPARTMENT_OTHER): Admitting: Oncology

## 2021-01-25 ENCOUNTER — Encounter: Payer: Self-pay | Admitting: Oncology

## 2021-01-25 VITALS — BP 113/71 | HR 84 | Temp 98.3°F | Wt 238.0 lb

## 2021-01-25 DIAGNOSIS — Z5112 Encounter for antineoplastic immunotherapy: Secondary | ICD-10-CM | POA: Insufficient documentation

## 2021-01-25 DIAGNOSIS — R7989 Other specified abnormal findings of blood chemistry: Secondary | ICD-10-CM

## 2021-01-25 DIAGNOSIS — E1122 Type 2 diabetes mellitus with diabetic chronic kidney disease: Secondary | ICD-10-CM | POA: Diagnosis not present

## 2021-01-25 DIAGNOSIS — I129 Hypertensive chronic kidney disease with stage 1 through stage 4 chronic kidney disease, or unspecified chronic kidney disease: Secondary | ICD-10-CM | POA: Diagnosis not present

## 2021-01-25 DIAGNOSIS — C438 Malignant melanoma of overlapping sites of skin: Secondary | ICD-10-CM | POA: Diagnosis not present

## 2021-01-25 DIAGNOSIS — D649 Anemia, unspecified: Secondary | ICD-10-CM | POA: Diagnosis not present

## 2021-01-25 DIAGNOSIS — Z79899 Other long term (current) drug therapy: Secondary | ICD-10-CM | POA: Insufficient documentation

## 2021-01-25 DIAGNOSIS — N189 Chronic kidney disease, unspecified: Secondary | ICD-10-CM | POA: Diagnosis not present

## 2021-01-25 DIAGNOSIS — L80 Vitiligo: Secondary | ICD-10-CM | POA: Diagnosis not present

## 2021-01-25 DIAGNOSIS — C4359 Malignant melanoma of other part of trunk: Secondary | ICD-10-CM | POA: Insufficient documentation

## 2021-01-25 DIAGNOSIS — R42 Dizziness and giddiness: Secondary | ICD-10-CM | POA: Insufficient documentation

## 2021-01-25 LAB — CBC WITH DIFFERENTIAL/PLATELET
Abs Immature Granulocytes: 0.04 10*3/uL (ref 0.00–0.07)
Basophils Absolute: 0 10*3/uL (ref 0.0–0.1)
Basophils Relative: 0 %
Eosinophils Absolute: 0.2 10*3/uL (ref 0.0–0.5)
Eosinophils Relative: 4 %
HCT: 32.7 % — ABNORMAL LOW (ref 39.0–52.0)
Hemoglobin: 11.4 g/dL — ABNORMAL LOW (ref 13.0–17.0)
Immature Granulocytes: 1 %
Lymphocytes Relative: 26 %
Lymphs Abs: 1.2 10*3/uL (ref 0.7–4.0)
MCH: 31 pg (ref 26.0–34.0)
MCHC: 34.9 g/dL (ref 30.0–36.0)
MCV: 88.9 fL (ref 80.0–100.0)
Monocytes Absolute: 0.4 10*3/uL (ref 0.1–1.0)
Monocytes Relative: 8 %
Neutro Abs: 2.8 10*3/uL (ref 1.7–7.7)
Neutrophils Relative %: 61 %
Platelets: 191 10*3/uL (ref 150–400)
RBC: 3.68 MIL/uL — ABNORMAL LOW (ref 4.22–5.81)
RDW: 13.2 % (ref 11.5–15.5)
WBC: 4.6 10*3/uL (ref 4.0–10.5)
nRBC: 0 % (ref 0.0–0.2)

## 2021-01-25 LAB — COMPREHENSIVE METABOLIC PANEL
ALT: 29 U/L (ref 0–44)
AST: 25 U/L (ref 15–41)
Albumin: 4.2 g/dL (ref 3.5–5.0)
Alkaline Phosphatase: 91 U/L (ref 38–126)
Anion gap: 8 (ref 5–15)
BUN: 47 mg/dL — ABNORMAL HIGH (ref 8–23)
CO2: 20 mmol/L — ABNORMAL LOW (ref 22–32)
Calcium: 9 mg/dL (ref 8.9–10.3)
Chloride: 110 mmol/L (ref 98–111)
Creatinine, Ser: 1.53 mg/dL — ABNORMAL HIGH (ref 0.61–1.24)
GFR, Estimated: 51 mL/min — ABNORMAL LOW (ref >60)
Glucose, Bld: 109 mg/dL — ABNORMAL HIGH (ref 70–99)
Potassium: 4.7 mmol/L (ref 3.5–5.1)
Sodium: 138 mmol/L (ref 135–145)
Total Bilirubin: 0.5 mg/dL (ref 0.3–1.2)
Total Protein: 7.4 g/dL (ref 6.5–8.1)

## 2021-01-25 MED ORDER — SODIUM CHLORIDE 0.9% FLUSH
10.0000 mL | Freq: Once | INTRAVENOUS | Status: AC
  Administered 2021-01-25: 10 mL via INTRAVENOUS
  Filled 2021-01-25: qty 10

## 2021-01-25 MED ORDER — SODIUM CHLORIDE 0.9 % IV SOLN
Freq: Once | INTRAVENOUS | Status: AC
  Filled 2021-01-25: qty 250

## 2021-01-25 MED ORDER — HEPARIN SOD (PORK) LOCK FLUSH 100 UNIT/ML IV SOLN
500.0000 [IU] | Freq: Once | INTRAVENOUS | Status: AC
  Administered 2021-01-25: 500 [IU] via INTRAVENOUS
  Filled 2021-01-25: qty 5

## 2021-01-25 NOTE — Progress Notes (Signed)
Hematology/Oncology follow up  note [facility] Telephone:(336) [phone] Fax:(336) [phone]   Patient Care Team: Mechele Claude, FNP as PCP - General (Family Medicine) [name], MD as Consulting Physician (Oncology)  REFERRING PROVIDER: Mechele Claude, FNP  CHIEF COMPLAINTS/REASON FOR VISIT:  Follow up for melanoma HISTORY OF PRESENTING ILLNESS:   Edward Pitts is a  60 y.o.  male with PMH listed below was seen in consultation at the request of  Mechele Claude, FNP  for evaluation of inguinal mass Patient presented to emergency room 3 days ago complaining about left ing uinal mass discomfort. Reports that he has really noticed the mass growing for the past 1 months. He has a history of left lower extremity melanoma in 2011, status post local excision.  Pain was increased with squatting of laxation. He was advised to take Tylenol for pain. Denies any fever, chills, night sweating.  He does feel mild nauseated. Appetite is fair.  He has lost about 10 pounds since earlier this year. 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  Left inguinal and left iliac nodes which are suspicious for involvement.  There are also 2 small nonspecific hypodense lesions within the right liver, nonspecific.  # patient underwent left groin mass resection on 04/16/2019. Resection pathology showed malignant melanoma, replacing a lymph node, with extracapsular extension, peripheral and deep margins involved.  Left inguinal contents, all 7 lymph nodes were negative for melanoma in the lymph nodes.  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  # PDL1 80% TPS  # 04/16/2019. underwent left groin mass resection   07/07/2019  Status post adjuvant radiation and finished radiation  Patient has Mediport placed to facilitate immunotherapy  treatments.  06/29/2020, CT chest abdomen pelvis showed stable postoperative appearance of the left groin.  No evidence of local recurrence.  No evidence of metastatic disease in the chest abdomen or pelvis.  Hepatic steatosis.  Stable subcentimeter fluid attenuation lesion of the lateral right lobe of the liver, likely benign cyst or hemangioma.  Coronary artery disease.  Aortic atherosclerosis  10/20/2020, CT chest abdomen pelvis showed stable postoperative/radiation appearance of the left groin.  No evidence of local recurrence/metastatic disease within the chest abdomen/pelvis.  Fatty liver disease.  Diverticulosis without evidence of typhlitis.  Aortic atherosclerosis  INTERVAL HISTORY Edward Pitts is a 60 y.o. male who has above history reviewed by me today presents for follow up visit for management of inguinal nodal recurrence of melanoma Patient is on Opdyke for possible seizure.  He follows up with neurology.  He denies any other new medication. He has adequate p.o. intake.  . :Review of Systems  Constitutional:  Negative for appetite change, chills, fatigue, fever and unexpected weight change.  HENT:   Negative for hearing loss and voice change.   Eyes:  Negative for eye problems and icterus.  Respiratory:  Negative for chest tightness, cough and shortness of breath.   Cardiovascular:  Negative for chest pain and leg swelling.  Gastrointestinal:  Negative for abdominal distention and abdominal pain.  Endocrine: Negative for hot flashes.  Genitourinary:  Negative for difficulty urinating, dysuria and frequency.   Musculoskeletal:        Status post left hip replacement  Skin:  Negative for itching and rash.       Skin hypo-pigmentation on upper extremities, no change  Neurological:  Negative for light-headedness  and numbness.  Hematological:  Negative for adenopathy. Does not bruise/bleed easily.  Psychiatric/Behavioral:  Negative for confusion.    MEDICAL HISTORY:  Past Medical  History:  Diagnosis Date   Anemia    iron treatments   Anxiety    Aortic atherosclerosis (HCC)    Arthritis    Cancer of groin ([place]) 2021   left groin, resected, radiation   Cataract    Complication of anesthesia    PONV   Coronary artery disease    Dizziness of unknown etiology    has led to seizures and passing out.   Family history of adverse reaction to anesthesia    PONV mother   GERD (gastroesophageal reflux disease)    History of complete heart block    PPM placed   Hyperlipidemia    Hypertension    LBBB (left bundle branch block)    Lymphedema of left leg    uses thigh high compression stockings   Melanoma ([place]) 2012   skin cancer, left thigh   OSA on CPAP    PONV (postoperative nausea and vomiting) 04/16/2019   Port-A-Cath in place    RIGHT chest wall   Presence of cardiac pacemaker    [employer]   Seizures ([place])    still has episodes of dizziness. last event 1 month ago (march 2022) and will pass out. takes clonazepam    SURGICAL HISTORY: Past Surgical History:  Procedure Laterality Date   CT RADIATION THERAPY GUIDE     left groin   dental implant     permanent implant   KNEE SURGERY Left    arthroscopy   LEFT HEART CATH AND CORONARY ANGIOGRAPHY Left 06/29/2017   Procedure: LEFT HEART CATH AND CORONARY ANGIOGRAPHY;  Surgeon: [name], MD;  Location: [place] [facility];  Service: Cardiovascular;  Laterality: Left;   LYMPH NODE DISSECTION Left 04/16/2019   Procedure: Left inguinal Lymph Node Dissection;  Surgeon: [name], MD;  Location: [place];  Service: General;  Laterality: Left;   MELANOMA EXCISION Left 04/16/2019   Procedure: MELANOMA EXCISION LEFT GROIN MASS;  Surgeon: [name], MD;  Location: [place];  Service: General;  Laterality: Left;   MELANOMA EXCISION WITH SENTINEL LYMPH NODE BIOPSY Left 2012   Left calf    PACEMAKER INSERTION N/A 08/26/2018   Procedure: INSERTION PACEMAKER;  Surgeon: [name], MD;  Location: [facility]  [facility];  Service: Cardiovascular;  Laterality: N/A;   PORTA CATH INSERTION N/A 08/26/2019   Procedure: PORTA CATH INSERTION;  Surgeon: Katha Cabal, MD;  Location: [place] [facility];  Service: Cardiovascular;  Laterality: N/A;   SUPERFICIAL LYMPH NODE BIOPSY / EXCISION Left 2020   lymph nodes removed around left groin melanoma site   TEMPORARY PACEMAKER N/A 08/25/2018   Procedure: TEMPORARY PACEMAKER;  Surgeon: [name], MD;  Location: [place] [facility];  Service: Cardiovascular;  Laterality: N/A;   TOTAL HIP ARTHROPLASTY Left 07/14/2020   Procedure: TOTAL HIP ARTHROPLASTY;  Surgeon: Dereck Leep, MD;  Location: [facility];  Service: Orthopedics;  Laterality: Left;    SOCIAL HISTORY: Social History   Socioeconomic History   Marital status: Married    Spouse name: Vicente Males    Number of children: 7   Years of education: 12   Highest education level: Not on file  Occupational History    Comment: disability  Tobacco Use   Smoking status: Never   Smokeless tobacco: Never  Vaping Use   Vaping Use: Never used  Substance  and Sexual Activity   Alcohol use: No   Drug use: No   Sexual activity: Not Currently  Other Topics Concern   Not on file  Social History Narrative   Lives with  Wife,   Has 2 small dogs   Caffeine use: sodas (2 per day)      Out of work on disability.  Has a walk in shower. No stairs to climb   Oncology treatment ongoing. Uses port a cath for treatment.      pacemaker   Social Determinants of Health   Financial Resource Strain: Not on file  Food Insecurity: Not on file  Transportation Needs: Not on file  Physical Activity: Not on file  Stress: Not on file  Social Connections: Not on file  Intimate Partner Violence: Not on file    FAMILY HISTORY: Family History  Problem Relation Age of Onset   Cancer Paternal Grandmother     ALLERGIES:  is allergic to ibuprofen and nsaids.  MEDICATIONS:  Current Outpatient Medications  Medication Sig  Dispense Refill   allopurinol (ZYLOPRIM) 300 MG tablet Take 300 mg by mouth daily.     ASPIRIN 81 PO Take 1 tablet by mouth daily.     atorvastatin (LIPITOR) 20 MG tablet Take 20 mg by mouth daily.     clonazePAM (KLONOPIN) 0.5 MG tablet 1 tablet in the morning, 2 in the evening 90 tablet 3   hydrocortisone 2.5 % ointment Apply topically 2 (two) times daily.     ketoconazole (NIZORAL) 2 % cream Apply 1 application topically daily.     levETIRAcetam (KEPPRA) 250 MG tablet TAKE 2 TWICE A DAY 360 tablet 1   lidocaine-prilocaine (EMLA) cream Apply 1 application topically as needed. Apply small amount of cream to port site approx 1-2 hours prior to appointment. 30 g 2   lisinopril (ZESTRIL) 20 MG tablet Take 20 mg by mouth daily.     metoprolol succinate (TOPROL-XL) 25 MG 24 hr tablet Take 25 mg by mouth daily.      Multiple Vitamin (MULTIVITAMIN WITH MINERALS) TABS tablet Take 1 tablet by mouth daily. Centrum Silver     nystatin (MYCOSTATIN/NYSTOP) powder Apply 1 application topically 3 (three) times daily. Apply small amount to affected area 3 times a day until healed 60 g 2   omeprazole (PRILOSEC) 20 MG capsule TAKE 1 CAPSULE BY MOUTH EVERY DAY 30 capsule 1   ondansetron (ZOFRAN) 4 MG tablet TAKE 1 TABLET BY MOUTH EVERY 8 HOURS AS NEEDED FOR NAUSEA AND VOMITING 90 tablet 1   oxyCODONE (OXY IR/ROXICODONE) 5 MG immediate release tablet Take 1 tablet (5 mg total) by mouth every 4 (four) hours as needed for moderate pain (pain score 4-6). (Patient not taking: No sig reported) 30 tablet 0   No current facility-administered medications for this visit.     PHYSICAL EXAMINATION: ECOG PERFORMANCE STATUS: 1 - Symptomatic but completely ambulatory Vitals:   01/25/21 0958  BP: 113/71  Pulse: 84  Temp: 98.3 F (36.8 C)   Filed Weights   01/25/21 0958  Weight: 238 lb (108 kg)    Physical Exam Constitutional:      General: He is not in acute distress.    Comments: Patient ambulates independently   HENT:     Head: Normocephalic and atraumatic.  Eyes:     General: No scleral icterus.    Pupils: Pupils are equal, round, and reactive to light.  Cardiovascular:     Rate and Rhythm: Normal rate and  regular rhythm.     Heart sounds: Normal heart sounds.  Pulmonary:     Effort: Pulmonary effort is normal. No respiratory distress.     Breath sounds: No wheezing.  Abdominal:     General: Bowel sounds are normal. There is no distension.     Palpations: Abdomen is soft. There is no mass.     Tenderness: There is no abdominal tenderness.     Comments:    Musculoskeletal:        General: No deformity. Normal range of motion.     Cervical back: Normal range of motion and neck supple.     Comments: Left lower extremity edema Left hip replacement  Skin:    General: Skin is warm and dry.  Neurological:     Mental Status: He is alert and oriented to person, place, and time. Mental status is at baseline.     Cranial Nerves: No cranial nerve deficit.     Coordination: Coordination normal.  Psychiatric:        Mood and Affect: Mood normal.    LABORATORY DATA:  I have reviewed the data as listed Lab Results  Component Value Date   WBC 4.6 01/25/2021   HGB 11.4 (L) 01/25/2021   HCT 32.7 (L) 01/25/2021   MCV 88.9 01/25/2021   PLT 191 01/25/2021   Recent Labs    12/28/20 0936 01/11/21 0900 01/25/21 0908  NA 137 137 138  K 3.9 4.1 4.7  CL 106 107 110  CO2 24 23 20*  GLUCOSE 143* 121* 109*  BUN 21 40* 47*  CREATININE 1.07 1.44* 1.53*  CALCIUM 8.8* 9.2 9.0  GFRNONAA >60 55* 51*  PROT 7.3 7.5 7.4  ALBUMIN 4.1 4.2 4.2  AST 33 31 25  ALT 40 40 29  ALKPHOS 94 97 91  BILITOT 0.7 0.3 0.5    Iron/TIBC/Ferritin/ %Sat    Component Value Date/Time   IRON 85 10/02/2019 0841   TIBC 342 10/02/2019 0841   FERRITIN 61 10/02/2019 0841   IRONPCTSAT 25 10/02/2019 0841      RADIOGRAPHIC STUDIES: I have personally reviewed the radiological images as listed and agreed with the  findings in the report. CT CHEST ABDOMEN PELVIS WO CONTRAST  Result Date: 01/20/2021 CLINICAL DATA:  Melanoma, metastases to the left groin, status post resection EXAM: CT CHEST, ABDOMEN AND PELVIS WITHOUT CONTRAST TECHNIQUE: Multidetector CT imaging of the chest, abdomen and pelvis was performed following the standard protocol without IV contrast. COMPARISON:  10/20/2020 FINDINGS: CT CHEST FINDINGS Cardiovascular: Right chest port catheter. Aortic atherosclerosis. Left chest multi lead pacer. Normal heart size. Left and right coronary artery calcification. No pericardial effusion. Mediastinum/Nodes: No enlarged mediastinal, hilar, or axillary lymph nodes. Thyroid gland, trachea, and esophagus demonstrate no significant findings. Lungs/Pleura: Stable, benign 2 mm fissural nodule superior segment left lower lobe (series 3, image 76). No pleural effusion or pneumothorax. Musculoskeletal: No chest wall mass or suspicious bone lesions identified. CT ABDOMEN PELVIS FINDINGS Hepatobiliary: No solid liver abnormality is seen. Hepatic steatosis. No gallstones, gallbladder wall thickening, or biliary dilatation. Pancreas: Unremarkable. No pancreatic ductal dilatation or surrounding inflammatory changes. Spleen: Normal in size without significant abnormality. Adrenals/Urinary Tract: Adrenal glands are unremarkable. Kidneys are normal, without renal calculi, solid lesion, or hydronephrosis. Bladder is unremarkable. Stomach/Bowel: Stomach is within normal limits. Appendix appears normal. No evidence of bowel wall thickening, distention, or inflammatory changes. Vascular/Lymphatic: Aortic atherosclerosis. No enlarged abdominal or pelvic lymph nodes. Redemonstrated postoperative findings of surgical resection in the  left groin without noncontrast evidence of recurrent lymphadenopathy or soft tissue. Reproductive: No mass or other abnormality. Other: Fat containing umbilical hernia.  No abdominopelvic ascites. Musculoskeletal:  No acute or significant osseous findings. Status post left hip total arthroplasty. IMPRESSION: 1. Redemonstrated postoperative findings of surgical resection in the left groin without noncontrast evidence of recurrent lymphadenopathy or soft tissue. 2. No noncontrast evidence of recurrent or metastatic disease in the chest, abdomen, or pelvis. 3. Hepatic steatosis. 4. Coronary artery disease. Aortic Atherosclerosis (ICD10-I70.0). Electronically Signed   By: Delanna Ahmadi M.D.   On: 01/20/2021 09:36        ASSESSMENT & PLAN:  1. Malignant melanoma of overlapping sites ([place])   2. Encounter for antineoplastic immunotherapy   3. Elevated serum creatinine   4. Vitiligo    #Left inguinal nodal recurrence of melanoma.  Status post resection and adjuvant radiation. Stage IV Recurrent, now in NED Labs are reviewed and discussed with patient. Hold Keytruda today due to worsening of kidney function.  Etiology unknown. CT chest abdomen pelvis was reviewed and discussed with patient.  No new metastasis or progressive disease.  # Elevated creatinine level, recommend patient to increase oral hydration.  IVF 1L NS x 1.  Etiology is unknown.  Recent CT scan showed no hydronephrosis. The only new medication was recently started was Keppra.  Although rare, Keppra has been reported to be associated with acute kidney failure.  I recommend patient to further discuss with neurology for alternatives.  #Vitiligo, stable #Normocytic anemia, hemoglobin is at 11.5  Monitor. # Spells, patient is on Keppra.  Obtain MRI brain.   Follow-up 2 weeks at [facility] treatment. We spent sufficient time to discuss many aspect of care, questions were answered to patient's satisfaction.   [name], MD, PhD Hematology Oncology [facility]  01/25/2021

## 2021-01-25 NOTE — Patient Instructions (Signed)
[facility] ONCOLOGY  Discharge Instructions: Thank you for choosing [facility] to provide your oncology and hematology care.  If you have a lab appointment with the [facility], please go directly to the [facility] and check in at the registration area.  Wear comfortable clothing and clothing appropriate for easy access to any Portacath or PICC line.   We strive to give you quality time with your provider. You may need to reschedule your appointment if you arrive late (15 or more minutes).  Arriving late affects you and other patients whose appointments are after yours.  Also, if you miss three or more appointments without notifying the office, you may be dismissed from the clinic at the provider's discretion.      For prescription refill requests, have your pharmacy contact our office and allow 72 hours for refills to be completed.    Today you received the following : Hydration   To help prevent nausea and vomiting after your treatment, we encourage you to take your nausea medication as directed.  BELOW ARE SYMPTOMS THAT SHOULD BE REPORTED IMMEDIATELY: *FEVER GREATER THAN 100.4 F (38 C) OR HIGHER *CHILLS OR SWEATING *NAUSEA AND VOMITING THAT IS NOT CONTROLLED WITH YOUR NAUSEA MEDICATION *UNUSUAL SHORTNESS OF BREATH *UNUSUAL BRUISING OR BLEEDING *URINARY PROBLEMS (pain or burning when urinating, or frequent urination) *BOWEL PROBLEMS (unusual diarrhea, constipation, pain near the anus) TENDERNESS IN MOUTH AND THROAT WITH OR WITHOUT PRESENCE OF ULCERS (sore throat, sores in mouth, or a toothache) UNUSUAL RASH, SWELLING OR PAIN  UNUSUAL VAGINAL DISCHARGE OR ITCHING   Items with * indicate a potential emergency and should be followed up as soon as possible or go to the Emergency Department if any problems should occur.  Please show the CHEMOTHERAPY ALERT CARD or IMMUNOTHERAPY ALERT CARD at check-in to the Emergency Department and triage  nurse.  Should you have questions after your visit or need to cancel or reschedule your appointment, please contact [facility]  [phone] and follow the prompts.  Office hours are 8:00 a.m. to 4:30 p.m. Monday - Friday. Please note that voicemails left after 4:00 p.m. may not be returned until the following business day.  We are closed weekends and major holidays. You have access to a nurse at all times for urgent questions. Please call the main number to the clinic [phone] and follow the prompts.  For any non-urgent questions, you may also contact your provider using MyChart. We now offer e-Visits for anyone 69 and older to request care online for non-urgent symptoms. For details visit [email or web address].   Also download the MyChart app! Go to the app store, search "MyChart", open the app, select [facility], and log in with your MyChart username and password.  Due to Covid, a mask is required upon entering the hospital/clinic. If you do not have a mask, one will be given to you upon arrival. For doctor visits, patients may have 1 support person aged 50 or older with them. For treatment visits, patients cannot have anyone with them due to current Covid guidelines and our immunocompromised population.

## 2021-01-26 NOTE — Telephone Encounter (Signed)
Pt has called to report that he has not had any issues with the clonazePAM (KLONOPIN) 1 MG tablet.  Pt states that it is the levETIRAcetam (KEPPRA) 250 MG tablet.  The message from 01-11-21 from Dr Jannifer Franklin was read to pt.  Pt confirmed that he has done what was suggested by Dr Jannifer Franklin" we will taper him off the medication.  He will go to 250 mg twice daily for 2 weeks and then stop the drug."  Pt stated that he took the medication this morning but will not take the medication again.  Pt was advised this message would be in the system for Dr Jannifer Franklin and POD for Dr Jannifer Franklin if there are questions or concerns

## 2021-02-08 ENCOUNTER — Other Ambulatory Visit: Payer: Self-pay

## 2021-02-08 ENCOUNTER — Inpatient Hospital Stay

## 2021-02-08 ENCOUNTER — Encounter: Payer: Self-pay | Admitting: Oncology

## 2021-02-08 ENCOUNTER — Inpatient Hospital Stay (HOSPITAL_BASED_OUTPATIENT_CLINIC_OR_DEPARTMENT_OTHER): Admitting: Oncology

## 2021-02-08 VITALS — BP 142/72 | HR 78 | Temp 97.8°F | Resp 18 | Wt 241.9 lb

## 2021-02-08 DIAGNOSIS — C438 Malignant melanoma of overlapping sites of skin: Secondary | ICD-10-CM

## 2021-02-08 DIAGNOSIS — Z5112 Encounter for antineoplastic immunotherapy: Secondary | ICD-10-CM

## 2021-02-08 DIAGNOSIS — L80 Vitiligo: Secondary | ICD-10-CM

## 2021-02-08 DIAGNOSIS — R42 Dizziness and giddiness: Secondary | ICD-10-CM

## 2021-02-08 DIAGNOSIS — D649 Anemia, unspecified: Secondary | ICD-10-CM

## 2021-02-08 DIAGNOSIS — Z95828 Presence of other vascular implants and grafts: Secondary | ICD-10-CM

## 2021-02-08 LAB — COMPREHENSIVE METABOLIC PANEL
ALT: 32 U/L (ref 0–44)
AST: 26 U/L (ref 15–41)
Albumin: 4 g/dL (ref 3.5–5.0)
Alkaline Phosphatase: 93 U/L (ref 38–126)
Anion gap: 10 (ref 5–15)
BUN: 31 mg/dL — ABNORMAL HIGH (ref 8–23)
CO2: 22 mmol/L (ref 22–32)
Calcium: 9 mg/dL (ref 8.9–10.3)
Chloride: 104 mmol/L (ref 98–111)
Creatinine, Ser: 1.04 mg/dL (ref 0.61–1.24)
GFR, Estimated: >60 mL/min (ref >60)
Glucose, Bld: 117 mg/dL — ABNORMAL HIGH (ref 70–99)
Potassium: 4.1 mmol/L (ref 3.5–5.1)
Sodium: 136 mmol/L (ref 135–145)
Total Bilirubin: 0.3 mg/dL (ref 0.3–1.2)
Total Protein: 7.3 g/dL (ref 6.5–8.1)

## 2021-02-08 LAB — CBC WITH DIFFERENTIAL/PLATELET
Abs Immature Granulocytes: 0.02 10*3/uL (ref 0.00–0.07)
Basophils Absolute: 0 10*3/uL (ref 0.0–0.1)
Basophils Relative: 1 %
Eosinophils Absolute: 0.2 10*3/uL (ref 0.0–0.5)
Eosinophils Relative: 4 %
HCT: 31.8 % — ABNORMAL LOW (ref 39.0–52.0)
Hemoglobin: 10.9 g/dL — ABNORMAL LOW (ref 13.0–17.0)
Immature Granulocytes: 1 %
Lymphocytes Relative: 24 %
Lymphs Abs: 1 10*3/uL (ref 0.7–4.0)
MCH: 30.8 pg (ref 26.0–34.0)
MCHC: 34.3 g/dL (ref 30.0–36.0)
MCV: 89.8 fL (ref 80.0–100.0)
Monocytes Absolute: 0.5 10*3/uL (ref 0.1–1.0)
Monocytes Relative: 11 %
Neutro Abs: 2.7 10*3/uL (ref 1.7–7.7)
Neutrophils Relative %: 59 %
Platelets: 164 10*3/uL (ref 150–400)
RBC: 3.54 MIL/uL — ABNORMAL LOW (ref 4.22–5.81)
RDW: 13.2 % (ref 11.5–15.5)
WBC: 4.4 10*3/uL (ref 4.0–10.5)
nRBC: 0 % (ref 0.0–0.2)

## 2021-02-08 LAB — T4, FREE: Free T4: 0.75 ng/dL (ref 0.61–1.12)

## 2021-02-08 LAB — TSH: TSH: 3.253 u[IU]/mL (ref 0.350–4.500)

## 2021-02-08 MED ORDER — SODIUM CHLORIDE 0.9 % IV SOLN
Freq: Once | INTRAVENOUS | Status: AC
  Filled 2021-02-08: qty 250

## 2021-02-08 MED ORDER — HEPARIN SOD (PORK) LOCK FLUSH 100 UNIT/ML IV SOLN
500.0000 [IU] | Freq: Once | INTRAVENOUS | Status: AC | PRN
  Administered 2021-02-08: 500 [IU]
  Filled 2021-02-08: qty 5

## 2021-02-08 MED ORDER — SODIUM CHLORIDE 0.9 % IV SOLN
240.0000 mg | Freq: Once | INTRAVENOUS | Status: AC
  Administered 2021-02-08: 240 mg via INTRAVENOUS
  Filled 2021-02-08: qty 24

## 2021-02-08 NOTE — Patient Instructions (Signed)
[facility]  Discharge Instructions: Thank you for choosing [facility] to provide your oncology and hematology care.  If you have a lab appointment with the [facility], please go directly to the [facility] and check in at the registration area.  Wear comfortable clothing and clothing appropriate for easy access to any Portacath or PICC line.   We strive to give you quality time with your provider. You may need to reschedule your appointment if you arrive late (15 or more minutes).  Arriving late affects you and other patients whose appointments are after yours.  Also, if you miss three or more appointments without notifying the office, you may be dismissed from the clinic at the provider's discretion.      For prescription refill requests, have your pharmacy contact our office and allow 72 hours for refills to be completed.    Today you received the following chemotherapy and/or immunotherapy agents: Opdivo      To help prevent nausea and vomiting after your treatment, we encourage you to take your nausea medication as directed.  BELOW ARE SYMPTOMS THAT SHOULD BE REPORTED IMMEDIATELY: *FEVER GREATER THAN 100.4 F (38 C) OR HIGHER *CHILLS OR SWEATING *NAUSEA AND VOMITING THAT IS NOT CONTROLLED WITH YOUR NAUSEA MEDICATION *UNUSUAL SHORTNESS OF BREATH *UNUSUAL BRUISING OR BLEEDING *URINARY PROBLEMS (pain or burning when urinating, or frequent urination) *BOWEL PROBLEMS (unusual diarrhea, constipation, pain near the anus) TENDERNESS IN MOUTH AND THROAT WITH OR WITHOUT PRESENCE OF ULCERS (sore throat, sores in mouth, or a toothache) UNUSUAL RASH, SWELLING OR PAIN  UNUSUAL VAGINAL DISCHARGE OR ITCHING   Items with * indicate a potential emergency and should be followed up as soon as possible or go to the Emergency Department if any problems should occur.  Please show the CHEMOTHERAPY ALERT CARD or IMMUNOTHERAPY ALERT CARD at check-in to  the Emergency Department and triage nurse.  Should you have questions after your visit or need to cancel or reschedule your appointment, please contact [facility]  [phone] and follow the prompts.  Office hours are 8:00 a.m. to 4:30 p.m. Monday - Friday. Please note that voicemails left after 4:00 p.m. may not be returned until the following business day.  We are closed weekends and major holidays. You have access to a nurse at all times for urgent questions. Please call the main number to the clinic [phone] and follow the prompts.  For any non-urgent questions, you may also contact your provider using MyChart. We now offer e-Visits for anyone 30 and older to request care online for non-urgent symptoms. For details visit [email or web address].   Also download the MyChart app! Go to the app store, search "MyChart", open the app, select [facility], and log in with your MyChart username and password.  Due to Covid, a mask is required upon entering the hospital/clinic. If you do not have a mask, one will be given to you upon arrival. For doctor visits, patients may have 1 support person aged 62 or older with them. For treatment visits, patients cannot have anyone with them due to current Covid guidelines and our immunocompromised population. Nivolumab injection What is this medication? NIVOLUMAB (nye VOL ue mab) is a monoclonal antibody. It treats certain types of cancer. Some of the cancers treated are colon cancer, head and neck cancer, Hodgkin lymphoma, lung cancer, and melanoma. This medicine may be used for other purposes; ask your health care provider or pharmacist if you have  questions. COMMON BRAND NAME(S): Opdivo What should I tell my care team before I take this medication? They need to know if you have any of these conditions: Autoimmune diseases such as Crohn's disease, ulcerative colitis, or lupus Have had or planning to have an allogeneic  stem cell transplant (uses someone else's stem cells) History of chest radiation Organ transplant Nervous system problems such as myasthenia gravis or Guillain-Barre syndrome An unusual or allergic reaction to nivolumab, other medicines, foods, dyes, or preservatives Pregnant or trying to get pregnant Breast-feeding How should I use this medication? This medication is injected into a vein. It is given in a hospital or clinic setting. A special MedGuide will be given to you before each treatment. Be sure to read this information carefully each time. Talk to your care team regarding the use of this medication in children. While it may be prescribed for children as young as 12 years for selected conditions, precautions do apply. Overdosage: If you think you have taken too much of this medicine contact a poison control center or emergency room at once. NOTE: This medicine is only for you. Do not share this medicine with others. What if I miss a dose? Keep appointments for follow-up doses. It is important not to miss your dose. Call your care team if you are unable to keep an appointment. What may interact with this medication? Interactions have not been studied. This list may not describe all possible interactions. Give your health care provider a list of all the medicines, herbs, non-prescription drugs, or dietary supplements you use. Also tell them if you smoke, drink alcohol, or use illegal drugs. Some items may interact with your medicine. What should I watch for while using this medication? Your condition will be monitored carefully while you are receiving this medication. You may need blood work done while you are taking this medication. Do not become pregnant while taking this medication or for 5 months after stopping it. Women should inform their care team if they wish to become pregnant or think they might be pregnant. There is a potential for serious harm to an unborn child. Talk to your  care team for more information. Do not breast-feed an infant while taking this medication or for 5 months after stopping it. What side effects may I notice from receiving this medication? Side effects that you should report to your care team as soon as possible: Allergic reactions--skin rash, itching, hives, swelling of the face, lips, tongue, or throat Bloody or black, tar-like stools Change in vision Chest pain Diarrhea Dry cough, shortness of breath or trouble breathing Eye pain Fast or irregular heartbeat Fever, chills High blood sugar (hyperglycemia)--increased thirst or amount of urine, unusual weakness or fatigue, blurry vision High thyroid levels (hyperthyroidism)--fast or irregular heartbeat, weight loss, excessive sweating or sensitivity to heat, tremors or shaking, anxiety, nervousness, irregular menstrual cycle or spotting Kidney injury--decrease in the amount of urine, swelling of the ankles, hands, or feet Liver injury--right upper belly pain, loss of appetite, nausea, light-colored stool, dark yellow or brown urine, yellowing skin or eyes, unusual weakness or fatigue Low red blood cell count--unusual weakness or fatigue, dizziness, headache, trouble breathing Low thyroid levels (hypothyroidism)--unusual weakness or fatigue, increased sensitivity to cold, constipation, hair loss, dry skin, weight gain, feelings of depression Mood and behavior changes-confusion, change in sex drive or performance, irritability Muscle pain or cramps Pain, tingling, or numbness in the hands or feet, muscle weakness, trouble walking, loss of balance or coordination Red or  dark brown urine Redness, blistering, peeling, or loosening of the skin, including inside the mouth Stomach pain Unusual bruising or bleeding Side effects that usually do not require medical attention (report to your care team if they continue or are bothersome): Bone pain Constipation Loss of  appetite Nausea Tiredness Vomiting This list may not describe all possible side effects. Call your doctor for medical advice about side effects. You may report side effects to FDA at 1-800-FDA-1088. Where should I keep my medication? This medication is given in a hospital or clinic and will not be stored at home. NOTE: This sheet is a summary. It may not cover all possible information. If you have questions about this medicine, talk to your doctor, pharmacist, or health care provider.  2022 Elsevier/Gold Standard (2020-11-30 00:00:00)

## 2021-02-08 NOTE — Progress Notes (Signed)
Hematology/Oncology follow up  note [phone] [phone]   Patient Care Team: Miki Kins, FNP as PCP - General (Family Medicine) Rickard Patience, MD as Consulting Physician (Oncology)  REFERRING PROVIDER: Miki Kins, FNP  CHIEF COMPLAINTS/REASON FOR VISIT:  Follow up for melanoma HISTORY OF PRESENTING ILLNESS:   Edward Pitts is a  60 y.o.  male with PMH listed below was seen in consultation at the request of  Miki Kins, FNP  for evaluation of inguinal mass Patient presented to emergency room 3 days ago complaining about left ing uinal mass discomfort. Reports that he has really noticed the mass growing for the past 1 months. He has a history of left lower extremity melanoma in 2011, status post local excision.  Pain was increased with squatting of laxation. He was advised to take Tylenol for pain. Denies any fever, chills, night sweating.  He does feel mild nauseated. Appetite is fair.  He has lost about 10 pounds since earlier this year. 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  Left inguinal and left iliac nodes which are suspicious for involvement.  There are also 2 small nonspecific hypodense lesions within the right liver, nonspecific.  # patient underwent left groin mass resection on 04/16/2019. Resection pathology showed malignant melanoma, replacing a lymph node, with extracapsular extension, peripheral and deep margins involved.  Left inguinal contents, all 7 lymph nodes were negative for melanoma in the lymph nodes.  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  # PDL1 80% TPS  # 04/16/2019. underwent left groin mass resection   07/07/2019  Status post adjuvant radiation and finished radiation  Patient has Mediport placed to facilitate immunotherapy treatments.  06/29/2020, CT chest abdomen pelvis showed  stable postoperative appearance of the left groin.  No evidence of local recurrence.  No evidence of metastatic disease in the chest abdomen or pelvis.  Hepatic steatosis.  Stable subcentimeter fluid attenuation lesion of the lateral right lobe of the liver, likely benign cyst or hemangioma.  Coronary artery disease.  Aortic atherosclerosis  10/20/2020, CT chest abdomen pelvis showed stable postoperative/radiation appearance of the left groin.  No evidence of local recurrence/metastatic disease within the chest abdomen/pelvis.  Fatty liver disease.  Diverticulosis without evidence of typhlitis.  Aortic atherosclerosis  INTERVAL HISTORY Edward Pitts is a 60 y.o. male who has above history reviewed by me today presents for follow up visit for management of inguinal nodal recurrence of melanoma Patient has been off Keppra due to not able to tolerate. MRI has been scheduled in December.  Patient complete heart block status post dual-chamber pacer in 2022.   He recently had a pacemaker interrogation and he follows up with cardiology. Patient continues to have intermittent spells. . :Review of Systems  Constitutional:  Negative for appetite change, chills, fatigue, fever and unexpected weight change.  HENT:   Negative for hearing loss and voice change.   Eyes:  Negative for eye problems and icterus.  Respiratory:  Negative for chest tightness, cough and shortness of breath.   Cardiovascular:  Negative for chest pain and leg swelling.  Gastrointestinal:  Negative for abdominal distention and abdominal pain.  Endocrine: Negative for hot flashes.  Genitourinary:  Negative for difficulty urinating, dysuria and frequency.   Musculoskeletal:        Status post left hip replacement  Skin:  Negative for itching and rash.  Skin hypo-pigmentation on upper extremities, no change  Neurological:  Negative for light-headedness and numbness.  Hematological:  Negative for adenopathy. Does not bruise/bleed  easily.  Psychiatric/Behavioral:  Negative for confusion.    MEDICAL HISTORY:  Past Medical History:  Diagnosis Date   Anemia    iron treatments   Anxiety    Aortic atherosclerosis (HCC)    Arthritis    Cancer of groin ([facility]) 2021   left groin, resected, radiation   Cataract    Complication of anesthesia    PONV   Coronary artery disease    Dizziness of unknown etiology    has led to seizures and passing out.   Family history of adverse reaction to anesthesia    PONV mother   GERD (gastroesophageal reflux disease)    History of complete heart block    PPM placed   Hyperlipidemia    Hypertension    LBBB (left bundle branch block)    Lymphedema of left leg    uses thigh high compression stockings   Melanoma ([facility]) 2012   skin cancer, left thigh   OSA on CPAP    PONV (postoperative nausea and vomiting) 04/16/2019   Port-A-Cath in place    RIGHT chest wall   Presence of cardiac pacemaker    [employer]   Seizures ([facility])    still has episodes of dizziness. last event 1 month ago (march 2022) and will pass out. takes clonazepam    SURGICAL HISTORY: Past Surgical History:  Procedure Laterality Date   CT RADIATION THERAPY GUIDE     left groin   dental implant     permanent implant   KNEE SURGERY Left    arthroscopy   LEFT HEART CATH AND CORONARY ANGIOGRAPHY Left 06/29/2017   Procedure: LEFT HEART CATH AND CORONARY ANGIOGRAPHY;  Surgeon: [name], MD;  Location: [facility];  Service: Cardiovascular;  Laterality: Left;   LYMPH NODE DISSECTION Left 04/16/2019   Procedure: Left inguinal Lymph Node Dissection;  Surgeon: [name], MD;  Location: [facility];  Service: General;  Laterality: Left;   MELANOMA EXCISION Left 04/16/2019   Procedure: MELANOMA EXCISION LEFT GROIN MASS;  Surgeon: [name], MD;  Location: [facility];  Service: General;  Laterality: Left;   MELANOMA EXCISION WITH SENTINEL LYMPH NODE BIOPSY Left 2012   Left calf    PACEMAKER INSERTION N/A  08/26/2018   Procedure: INSERTION PACEMAKER;  Surgeon: [name], MD;  Location: [facility];  Service: Cardiovascular;  Laterality: N/A;   PORTA CATH INSERTION N/A 08/26/2019   Procedure: PORTA CATH INSERTION;  Surgeon: Katha Cabal, MD;  Location: [facility];  Service: Cardiovascular;  Laterality: N/A;   SUPERFICIAL LYMPH NODE BIOPSY / EXCISION Left 2020   lymph nodes removed around left groin melanoma site   TEMPORARY PACEMAKER N/A 08/25/2018   Procedure: TEMPORARY PACEMAKER;  Surgeon: [name], MD;  Location: [facility];  Service: Cardiovascular;  Laterality: N/A;   TOTAL HIP ARTHROPLASTY Left 07/14/2020   Procedure: TOTAL HIP ARTHROPLASTY;  Surgeon: Dereck Leep, MD;  Location: [facility];  Service: Orthopedics;  Laterality: Left;    SOCIAL HISTORY: Social History   Socioeconomic History   Marital status: Married    Spouse name: Vicente Males    Number of children: 7   Years of education: 12   Highest education level: Not on file  Occupational History    Comment: disability  Tobacco Use   Smoking status: Never   Smokeless  tobacco: Never  Vaping Use   Vaping Use: Never used  Substance and Sexual Activity   Alcohol use: No   Drug use: No   Sexual activity: Not Currently  Other Topics Concern   Not on file  Social History Narrative   Lives with  Wife,   Has 2 small dogs   Caffeine use: sodas (2 per day)      Out of work on disability.  Has a walk in shower. No stairs to climb   Oncology treatment ongoing. Uses port a cath for treatment.      pacemaker   Social Determinants of Health   Financial Resource Strain: Not on file  Food Insecurity: Not on file  Transportation Needs: Not on file  Physical Activity: Not on file  Stress: Not on file  Social Connections: Not on file  Intimate Partner Violence: Not on file    FAMILY HISTORY: Family History  Problem Relation Age of Onset   Cancer Paternal Grandmother     ALLERGIES:  is  allergic to ibuprofen and nsaids.  MEDICATIONS:  Current Outpatient Medications  Medication Sig Dispense Refill   allopurinol (ZYLOPRIM) 300 MG tablet Take 300 mg by mouth daily.     ASPIRIN 81 PO Take 1 tablet by mouth daily.     atorvastatin (LIPITOR) 20 MG tablet Take 20 mg by mouth daily.     clonazePAM (KLONOPIN) 0.5 MG tablet 1 tablet in the morning, 2 in the evening 90 tablet 3   hydrocortisone 2.5 % ointment Apply topically 2 (two) times daily.     ketoconazole (NIZORAL) 2 % cream Apply 1 application topically daily.     lidocaine-prilocaine (EMLA) cream Apply 1 application topically as needed. Apply small amount of cream to port site approx 1-2 hours prior to appointment. 30 g 2   lisinopril (ZESTRIL) 20 MG tablet Take 20 mg by mouth daily.     metoprolol succinate (TOPROL-XL) 25 MG 24 hr tablet Take 25 mg by mouth daily.      Multiple Vitamin (MULTIVITAMIN WITH MINERALS) TABS tablet Take 1 tablet by mouth daily. Centrum Silver     nystatin (MYCOSTATIN/NYSTOP) powder Apply 1 application topically 3 (three) times daily. Apply small amount to affected area 3 times a day until healed 60 g 2   omeprazole (PRILOSEC) 20 MG capsule TAKE 1 CAPSULE BY MOUTH EVERY DAY 30 capsule 1   ondansetron (ZOFRAN) 4 MG tablet TAKE 1 TABLET BY MOUTH EVERY 8 HOURS AS NEEDED FOR NAUSEA AND VOMITING 90 tablet 1   oxyCODONE (OXY IR/ROXICODONE) 5 MG immediate release tablet Take 1 tablet (5 mg total) by mouth every 4 (four) hours as needed for moderate pain (pain score 4-6). (Patient not taking: No sig reported) 30 tablet 0   No current facility-administered medications for this visit.     PHYSICAL EXAMINATION: ECOG PERFORMANCE STATUS: 1 - Symptomatic but completely ambulatory Vitals:   02/08/21 1422  BP: (!) 142/72  Pulse: 78  Resp: 18  Temp: 97.8 F (36.6 C)   Filed Weights   02/08/21 1422  Weight: 241 lb 14.4 oz (109.7 kg)    Physical Exam Constitutional:      General: He is not in acute  distress.    Comments: Patient ambulates independently  HENT:     Head: Normocephalic and atraumatic.  Eyes:     General: No scleral icterus.    Pupils: Pupils are equal, round, and reactive to light.  Cardiovascular:     Rate and  Rhythm: Normal rate and regular rhythm.     Heart sounds: Normal heart sounds.  Pulmonary:     Effort: Pulmonary effort is normal. No respiratory distress.     Breath sounds: No wheezing.  Abdominal:     General: Bowel sounds are normal. There is no distension.     Palpations: Abdomen is soft. There is no mass.     Tenderness: There is no abdominal tenderness.     Comments:    Musculoskeletal:        General: No deformity. Normal range of motion.     Cervical back: Normal range of motion and neck supple.     Comments: Left lower extremity edema Left hip replacement  Skin:    General: Skin is warm and dry.  Neurological:     Mental Status: He is alert and oriented to person, place, and time. Mental status is at baseline.     Cranial Nerves: No cranial nerve deficit.     Coordination: Coordination normal.  Psychiatric:        Mood and Affect: Mood normal.    LABORATORY DATA:  I have reviewed the data as listed Lab Results  Component Value Date   WBC 4.4 02/08/2021   HGB 10.9 (L) 02/08/2021   HCT 31.8 (L) 02/08/2021   MCV 89.8 02/08/2021   PLT 164 02/08/2021   Recent Labs    01/11/21 0900 01/25/21 0908 02/08/21 1400  NA 137 138 136  K 4.1 4.7 4.1  CL 107 110 104  CO2 23 20* 22  GLUCOSE 121* 109* 117*  BUN 40* 47* 31*  CREATININE 1.44* 1.53* 1.04  CALCIUM 9.2 9.0 9.0  GFRNONAA 55* 51* >60  PROT 7.5 7.4 7.3  ALBUMIN 4.2 4.2 4.0  AST $Re'31 25 26  'LMD$ ALT 40 29 32  ALKPHOS 97 91 93  BILITOT 0.3 0.5 0.3    Iron/TIBC/Ferritin/ %Sat    Component Value Date/Time   IRON 85 10/02/2019 0841   TIBC 342 10/02/2019 0841   FERRITIN 61 10/02/2019 0841   IRONPCTSAT 25 10/02/2019 0841      RADIOGRAPHIC STUDIES: I have personally reviewed the  radiological images as listed and agreed with the findings in the report. CT CHEST ABDOMEN PELVIS WO CONTRAST  Result Date: 01/20/2021 CLINICAL DATA:  Melanoma, metastases to the left groin, status post resection EXAM: CT CHEST, ABDOMEN AND PELVIS WITHOUT CONTRAST TECHNIQUE: Multidetector CT imaging of the chest, abdomen and pelvis was performed following the standard protocol without IV contrast. COMPARISON:  10/20/2020 FINDINGS: CT CHEST FINDINGS Cardiovascular: Right chest port catheter. Aortic atherosclerosis. Left chest multi lead pacer. Normal heart size. Left and right coronary artery calcification. No pericardial effusion. Mediastinum/Nodes: No enlarged mediastinal, hilar, or axillary lymph nodes. Thyroid gland, trachea, and esophagus demonstrate no significant findings. Lungs/Pleura: Stable, benign 2 mm fissural nodule superior segment left lower lobe (series 3, image 76). No pleural effusion or pneumothorax. Musculoskeletal: No chest wall mass or suspicious bone lesions identified. CT ABDOMEN PELVIS FINDINGS Hepatobiliary: No solid liver abnormality is seen. Hepatic steatosis. No gallstones, gallbladder wall thickening, or biliary dilatation. Pancreas: Unremarkable. No pancreatic ductal dilatation or surrounding inflammatory changes. Spleen: Normal in size without significant abnormality. Adrenals/Urinary Tract: Adrenal glands are unremarkable. Kidneys are normal, without renal calculi, solid lesion, or hydronephrosis. Bladder is unremarkable. Stomach/Bowel: Stomach is within normal limits. Appendix appears normal. No evidence of bowel wall thickening, distention, or inflammatory changes. Vascular/Lymphatic: Aortic atherosclerosis. No enlarged abdominal or pelvic lymph nodes. Redemonstrated postoperative findings of  surgical resection in the left groin without noncontrast evidence of recurrent lymphadenopathy or soft tissue. Reproductive: No mass or other abnormality. Other: Fat containing umbilical  hernia.  No abdominopelvic ascites. Musculoskeletal: No acute or significant osseous findings. Status post left hip total arthroplasty. IMPRESSION: 1. Redemonstrated postoperative findings of surgical resection in the left groin without noncontrast evidence of recurrent lymphadenopathy or soft tissue. 2. No noncontrast evidence of recurrent or metastatic disease in the chest, abdomen, or pelvis. 3. Hepatic steatosis. 4. Coronary artery disease. Aortic Atherosclerosis (ICD10-I70.0). Electronically Signed   By: Delanna Ahmadi M.D.   On: 01/20/2021 09:36        ASSESSMENT & PLAN:  1. Malignant melanoma of overlapping sites ([facility])   2. Encounter for antineoplastic immunotherapy   3. Vitiligo   4. Spell of dizziness    #Left inguinal nodal recurrence of melanoma.  Status post resection and adjuvant radiation. Stage IV Recurrent, now in NED Labs reviewed and discussed with patient. Proceed with nivolumab today.  #AKI has completely resolved.  #Vitiligo, stable #Normocytic anemia, hemoglobin is at 11.5  Monitor. # Spells,  Obtain MRI brain.-Which is scheduled.  Etiology unknown.  Questionable seizure.  Recommend patient to refrain from driving.  Follow-up 2 weeks at [facility] Nivolumab treatment. We spent sufficient time to discuss many aspect of care, questions were answered to patient's satisfaction.   [name], MD, PhD  02/08/2021

## 2021-02-10 ENCOUNTER — Other Ambulatory Visit: Payer: Self-pay | Admitting: Oncology

## 2021-02-12 ENCOUNTER — Encounter: Payer: Self-pay | Admitting: Oncology

## 2021-02-21 ENCOUNTER — Other Ambulatory Visit: Payer: Self-pay | Admitting: *Deleted

## 2021-02-21 DIAGNOSIS — C438 Malignant melanoma of overlapping sites of skin: Secondary | ICD-10-CM

## 2021-02-22 ENCOUNTER — Inpatient Hospital Stay (HOSPITAL_BASED_OUTPATIENT_CLINIC_OR_DEPARTMENT_OTHER): Admitting: Oncology

## 2021-02-22 ENCOUNTER — Inpatient Hospital Stay

## 2021-02-22 ENCOUNTER — Encounter: Payer: Self-pay | Admitting: Oncology

## 2021-02-22 ENCOUNTER — Other Ambulatory Visit: Payer: Self-pay

## 2021-02-22 VITALS — BP 138/82 | HR 87 | Temp 98.2°F | Wt 240.0 lb

## 2021-02-22 DIAGNOSIS — C438 Malignant melanoma of overlapping sites of skin: Secondary | ICD-10-CM

## 2021-02-22 DIAGNOSIS — R42 Dizziness and giddiness: Secondary | ICD-10-CM

## 2021-02-22 DIAGNOSIS — Z5112 Encounter for antineoplastic immunotherapy: Secondary | ICD-10-CM | POA: Diagnosis not present

## 2021-02-22 DIAGNOSIS — L80 Vitiligo: Secondary | ICD-10-CM | POA: Diagnosis not present

## 2021-02-22 LAB — CBC WITH DIFFERENTIAL/PLATELET
Abs Immature Granulocytes: 0.04 10*3/uL (ref 0.00–0.07)
Basophils Absolute: 0 10*3/uL (ref 0.0–0.1)
Basophils Relative: 0 %
Eosinophils Absolute: 0.2 10*3/uL (ref 0.0–0.5)
Eosinophils Relative: 4 %
HCT: 33.7 % — ABNORMAL LOW (ref 39.0–52.0)
Hemoglobin: 11.5 g/dL — ABNORMAL LOW (ref 13.0–17.0)
Immature Granulocytes: 1 %
Lymphocytes Relative: 27 %
Lymphs Abs: 1.3 10*3/uL (ref 0.7–4.0)
MCH: 30.7 pg (ref 26.0–34.0)
MCHC: 34.1 g/dL (ref 30.0–36.0)
MCV: 89.9 fL (ref 80.0–100.0)
Monocytes Absolute: 0.6 10*3/uL (ref 0.1–1.0)
Monocytes Relative: 12 %
Neutro Abs: 2.8 10*3/uL (ref 1.7–7.7)
Neutrophils Relative %: 56 %
Platelets: 203 10*3/uL (ref 150–400)
RBC: 3.75 MIL/uL — ABNORMAL LOW (ref 4.22–5.81)
RDW: 13.2 % (ref 11.5–15.5)
WBC: 4.9 10*3/uL (ref 4.0–10.5)
nRBC: 0 % (ref 0.0–0.2)

## 2021-02-22 LAB — COMPREHENSIVE METABOLIC PANEL
ALT: 32 U/L (ref 0–44)
AST: 27 U/L (ref 15–41)
Albumin: 4.3 g/dL (ref 3.5–5.0)
Alkaline Phosphatase: 90 U/L (ref 38–126)
Anion gap: 8 (ref 5–15)
BUN: 40 mg/dL — ABNORMAL HIGH (ref 8–23)
CO2: 23 mmol/L (ref 22–32)
Calcium: 9.5 mg/dL (ref 8.9–10.3)
Chloride: 105 mmol/L (ref 98–111)
Creatinine, Ser: 1.39 mg/dL — ABNORMAL HIGH (ref 0.61–1.24)
GFR, Estimated: 58 mL/min — ABNORMAL LOW (ref >60)
Glucose, Bld: 96 mg/dL (ref 70–99)
Potassium: 4.9 mmol/L (ref 3.5–5.1)
Sodium: 136 mmol/L (ref 135–145)
Total Bilirubin: 0.5 mg/dL (ref 0.3–1.2)
Total Protein: 7.1 g/dL (ref 6.5–8.1)

## 2021-02-22 MED ORDER — HEPARIN SOD (PORK) LOCK FLUSH 100 UNIT/ML IV SOLN
500.0000 [IU] | Freq: Once | INTRAVENOUS | Status: AC
  Administered 2021-02-22: 500 [IU] via INTRAVENOUS
  Filled 2021-02-22: qty 5

## 2021-02-22 MED ORDER — SODIUM CHLORIDE 0.9% FLUSH
10.0000 mL | Freq: Once | INTRAVENOUS | Status: AC
  Administered 2021-02-22: 10 mL via INTRAVENOUS
  Filled 2021-02-22: qty 10

## 2021-02-22 MED ORDER — SODIUM CHLORIDE 0.9 % IV SOLN
240.0000 mg | Freq: Once | INTRAVENOUS | Status: AC
  Administered 2021-02-22: 240 mg via INTRAVENOUS
  Filled 2021-02-22: qty 24

## 2021-02-22 MED ORDER — SODIUM CHLORIDE 0.9 % IV SOLN
Freq: Once | INTRAVENOUS | Status: AC
  Filled 2021-02-22: qty 250

## 2021-02-22 MED ORDER — LIDOCAINE-PRILOCAINE 2.5-2.5 % EX CREA: 1.0000 "application " | TOPICAL_CREAM | CUTANEOUS | 2 refills | Status: DC | PRN

## 2021-02-22 MED ORDER — HEPARIN SOD (PORK) LOCK FLUSH 100 UNIT/ML IV SOLN
500.0000 [IU] | Freq: Once | INTRAVENOUS | Status: DC | PRN
  Filled 2021-02-22: qty 5

## 2021-02-22 NOTE — Progress Notes (Signed)
Hematology/Oncology follow up  note [phone] [phone]   Patient Care Team: Mechele Claude, FNP as PCP - General (Family Medicine) [name], MD as Consulting Physician (Oncology)  REFERRING PROVIDER: Mechele Claude, FNP  CHIEF COMPLAINTS/REASON FOR VISIT:  Follow up for melanoma HISTORY OF PRESENTING ILLNESS:   Edward Pitts is a  60 y.o.  male with PMH listed below was seen in consultation at the request of  Mechele Claude, FNP  for evaluation of inguinal mass Patient presented to emergency room 3 days ago complaining about left ing uinal mass discomfort. Reports that he has really noticed the mass growing for the past 1 months. He has a history of left lower extremity melanoma in 2011, status post local excision.  Pain was increased with squatting of laxation. He was advised to take Tylenol for pain. Denies any fever, chills, night sweating.  He does feel mild nauseated. Appetite is fair.  He has lost about 10 pounds since earlier this year. 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  Left inguinal and left iliac nodes which are suspicious for involvement.  There are also 2 small nonspecific hypodense lesions within the right liver, nonspecific.  # patient underwent left groin mass resection on 04/16/2019. Resection pathology showed malignant melanoma, replacing a lymph node, with extracapsular extension, peripheral and deep margins involved.  Left inguinal contents, all 7 lymph nodes were negative for melanoma in the lymph nodes.  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  # PDL1 80% TPS  # 04/16/2019. underwent left groin mass resection   07/07/2019  Status post adjuvant radiation and finished radiation  Patient has Mediport placed to facilitate immunotherapy treatments.  06/29/2020, CT chest abdomen pelvis showed  stable postoperative appearance of the left groin.  No evidence of local recurrence.  No evidence of metastatic disease in the chest abdomen or pelvis.  Hepatic steatosis.  Stable subcentimeter fluid attenuation lesion of the lateral right lobe of the liver, likely benign cyst or hemangioma.  Coronary artery disease.  Aortic atherosclerosis  10/20/2020, CT chest abdomen pelvis showed stable postoperative/radiation appearance of the left groin.  No evidence of local recurrence/metastatic disease within the chest abdomen/pelvis.  Fatty liver disease.  Diverticulosis without evidence of typhlitis.  Aortic atherosclerosis  INTERVAL HISTORY Edward Pitts is a 60 y.o. male who has above history reviewed by me today presents for follow up visit for management of inguinal nodal recurrence of melanoma Patient has been off Keppra due to not able to tolerate. MRI has been scheduled in December.  Patient has no new complaints today. . :Review of Systems  Constitutional:  Negative for appetite change, chills, fatigue, fever and unexpected weight change.  HENT:   Negative for hearing loss and voice change.   Eyes:  Negative for eye problems and icterus.  Respiratory:  Negative for chest tightness, cough and shortness of breath.   Cardiovascular:  Negative for chest pain and leg swelling.  Gastrointestinal:  Negative for abdominal distention and abdominal pain.  Endocrine: Negative for hot flashes.  Genitourinary:  Negative for difficulty urinating, dysuria and frequency.   Musculoskeletal:        Status post left hip replacement  Skin:  Negative for itching and rash.       Skin hypo-pigmentation on upper extremities, no change  Neurological:  Negative for light-headedness and numbness.  Hematological:  Negative  for adenopathy. Does not bruise/bleed easily.  Psychiatric/Behavioral:  Negative for confusion.    MEDICAL HISTORY:  Past Medical History:  Diagnosis Date   Anemia    iron treatments   Anxiety     Aortic atherosclerosis (HCC)    Arthritis    Cancer of groin ([place]) 2021   left groin, resected, radiation   Cataract    Complication of anesthesia    PONV   Coronary artery disease    Dizziness of unknown etiology    has led to seizures and passing out.   Family history of adverse reaction to anesthesia    PONV mother   GERD (gastroesophageal reflux disease)    History of complete heart block    PPM placed   Hyperlipidemia    Hypertension    LBBB (left bundle branch block)    Lymphedema of left leg    uses thigh high compression stockings   Melanoma ([place]) 2012   skin cancer, left thigh   OSA on CPAP    PONV (postoperative nausea and vomiting) 04/16/2019   Port-A-Cath in place    RIGHT chest wall   Presence of cardiac pacemaker    [employer]   Seizures ([place])    still has episodes of dizziness. last event 1 month ago (march 2022) and will pass out. takes clonazepam    SURGICAL HISTORY: Past Surgical History:  Procedure Laterality Date   CT RADIATION THERAPY GUIDE     left groin   dental implant     permanent implant   KNEE SURGERY Left    arthroscopy   LEFT HEART CATH AND CORONARY ANGIOGRAPHY Left 06/29/2017   Procedure: LEFT HEART CATH AND CORONARY ANGIOGRAPHY;  Surgeon: [name], MD;  Location: [facility];  Service: Cardiovascular;  Laterality: Left;   LYMPH NODE DISSECTION Left 04/16/2019   Procedure: Left inguinal Lymph Node Dissection;  Surgeon: [name], MD;  Location: [place];  Service: General;  Laterality: Left;   MELANOMA EXCISION Left 04/16/2019   Procedure: MELANOMA EXCISION LEFT GROIN MASS;  Surgeon: [name], MD;  Location: [place];  Service: General;  Laterality: Left;   MELANOMA EXCISION WITH SENTINEL LYMPH NODE BIOPSY Left 2012   Left calf    PACEMAKER INSERTION N/A 08/26/2018   Procedure: INSERTION PACEMAKER;  Surgeon: [name], MD;  Location: [facility];  Service: Cardiovascular;  Laterality: N/A;   PORTA CATH  INSERTION N/A 08/26/2019   Procedure: PORTA CATH INSERTION;  Surgeon: Katha Cabal, MD;  Location: [facility];  Service: Cardiovascular;  Laterality: N/A;   SUPERFICIAL LYMPH NODE BIOPSY / EXCISION Left 2020   lymph nodes removed around left groin melanoma site   TEMPORARY PACEMAKER N/A 08/25/2018   Procedure: TEMPORARY PACEMAKER;  Surgeon: [name], MD;  Location: [facility];  Service: Cardiovascular;  Laterality: N/A;   TOTAL HIP ARTHROPLASTY Left 07/14/2020   Procedure: TOTAL HIP ARTHROPLASTY;  Surgeon: Dereck Leep, MD;  Location: [facility];  Service: Orthopedics;  Laterality: Left;    SOCIAL HISTORY: Social History   Socioeconomic History   Marital status: Married    Spouse name: Vicente Males    Number of children: 7   Years of education: 12   Highest education level: Not on file  Occupational History    Comment: disability  Tobacco Use   Smoking status: Never   Smokeless tobacco: Never  Vaping Use   Vaping Use: Never used  Substance and Sexual Activity   Alcohol  use: No   Drug use: No   Sexual activity: Not Currently  Other Topics Concern   Not on file  Social History Narrative   Lives with  Wife,   Has 2 small dogs   Caffeine use: sodas (2 per day)      Out of work on disability.  Has a walk in shower. No stairs to climb   Oncology treatment ongoing. Uses port a cath for treatment.      pacemaker   Social Determinants of Health   Financial Resource Strain: Not on file  Food Insecurity: Not on file  Transportation Needs: Not on file  Physical Activity: Not on file  Stress: Not on file  Social Connections: Not on file  Intimate Partner Violence: Not on file    FAMILY HISTORY: Family History  Problem Relation Age of Onset   Cancer Paternal Grandmother     ALLERGIES:  is allergic to ibuprofen and nsaids.  MEDICATIONS:  Current Outpatient Medications  Medication Sig Dispense Refill   allopurinol (ZYLOPRIM) 300 MG tablet Take 300  mg by mouth daily.     ASPIRIN 81 PO Take 1 tablet by mouth daily.     atorvastatin (LIPITOR) 20 MG tablet Take 20 mg by mouth daily.     clonazePAM (KLONOPIN) 0.5 MG tablet 1 tablet in the morning, 2 in the evening 90 tablet 3   hydrocortisone 2.5 % ointment Apply topically 2 (two) times daily.     ketoconazole (NIZORAL) 2 % cream Apply 1 application topically daily.     lisinopril (ZESTRIL) 20 MG tablet Take 20 mg by mouth daily.     metoprolol succinate (TOPROL-XL) 25 MG 24 hr tablet Take 25 mg by mouth daily.      Multiple Vitamin (MULTIVITAMIN WITH MINERALS) TABS tablet Take 1 tablet by mouth daily. Centrum Silver     nystatin (MYCOSTATIN/NYSTOP) powder Apply 1 application topically 3 (three) times daily. Apply small amount to affected area 3 times a day until healed 60 g 2   omeprazole (PRILOSEC) 20 MG capsule TAKE 1 CAPSULE BY MOUTH EVERY DAY 30 capsule 1   ondansetron (ZOFRAN) 4 MG tablet TAKE 1 TABLET BY MOUTH EVERY 8 HOURS AS NEEDED FOR NAUSEA AND VOMITING 90 tablet 1   lidocaine-prilocaine (EMLA) cream Apply 1 application topically as needed. Apply small amount of cream to port site approx 1-2 hours prior to appointment. 30 g 2   oxyCODONE (OXY IR/ROXICODONE) 5 MG immediate release tablet Take 1 tablet (5 mg total) by mouth every 4 (four) hours as needed for moderate pain (pain score 4-6). (Patient not taking: Reported on 11/30/2020) 30 tablet 0   No current facility-administered medications for this visit.     PHYSICAL EXAMINATION: ECOG PERFORMANCE STATUS: 1 - Symptomatic but completely ambulatory Vitals:   02/22/21 1321  BP: 138/82  Pulse: 87  Temp: 98.2 F (36.8 C)   Filed Weights   02/22/21 1321  Weight: 240 lb (108.9 kg)    Physical Exam Constitutional:      General: He is not in acute distress.    Comments: Patient ambulates independently  HENT:     Head: Normocephalic and atraumatic.  Eyes:     General: No scleral icterus.    Pupils: Pupils are equal, round,  and reactive to light.  Cardiovascular:     Rate and Rhythm: Normal rate and regular rhythm.     Heart sounds: Normal heart sounds.  Pulmonary:     Effort: Pulmonary effort is  normal. No respiratory distress.     Breath sounds: No wheezing.  Abdominal:     General: Bowel sounds are normal. There is no distension.     Palpations: Abdomen is soft. There is no mass.     Tenderness: There is no abdominal tenderness.     Comments:    Musculoskeletal:        General: No deformity. Normal range of motion.     Cervical back: Normal range of motion and neck supple.     Comments: Left lower extremity edema Left hip replacement  Skin:    General: Skin is warm and dry.  Neurological:     Mental Status: He is alert and oriented to person, place, and time. Mental status is at baseline.     Cranial Nerves: No cranial nerve deficit.     Coordination: Coordination normal.  Psychiatric:        Mood and Affect: Mood normal.    LABORATORY DATA:  I have reviewed the data as listed Lab Results  Component Value Date   WBC 4.9 02/22/2021   HGB 11.5 (L) 02/22/2021   HCT 33.7 (L) 02/22/2021   MCV 89.9 02/22/2021   PLT 203 02/22/2021   Recent Labs    01/25/21 0908 02/08/21 1400 02/22/21 1300  NA 138 136 136  K 4.7 4.1 4.9  CL 110 104 105  CO2 20* 22 23  GLUCOSE 109* 117* 96  BUN 47* 31* 40*  CREATININE 1.53* 1.04 1.39*  CALCIUM 9.0 9.0 9.5  GFRNONAA 51* >60 58*  PROT 7.4 7.3 7.1  ALBUMIN 4.2 4.0 4.3  AST _0 ALT 29 32 32  ALKPHOS 91 93 90  BILITOT 0.5 0.3 0.5    Iron/TIBC/Ferritin/ %Sat    Component Value Date/Time   IRON 85 10/02/2019 0841   TIBC 342 10/02/2019 0841   FERRITIN 61 10/02/2019 0841   IRONPCTSAT 25 10/02/2019 0841      RADIOGRAPHIC STUDIES: I have personally reviewed the radiological images as listed and agreed with the findings in the report. CT CHEST ABDOMEN PELVIS WO CONTRAST  Result Date: 01/20/2021 CLINICAL DATA:  Melanoma, metastases to the  left groin, status post resection EXAM: CT CHEST, ABDOMEN AND PELVIS WITHOUT CONTRAST TECHNIQUE: Multidetector CT imaging of the chest, abdomen and pelvis was performed following the standard protocol without IV contrast. COMPARISON:  10/20/2020 FINDINGS: CT CHEST FINDINGS Cardiovascular: Right chest port catheter. Aortic atherosclerosis. Left chest multi lead pacer. Normal heart size. Left and right coronary artery calcification. No pericardial effusion. Mediastinum/Nodes: No enlarged mediastinal, hilar, or axillary lymph nodes. Thyroid gland, trachea, and esophagus demonstrate no significant findings. Lungs/Pleura: Stable, benign 2 mm fissural nodule superior segment left lower lobe (series 3, image 76). No pleural effusion or pneumothorax. Musculoskeletal: No chest wall mass or suspicious bone lesions identified. CT ABDOMEN PELVIS FINDINGS Hepatobiliary: No solid liver abnormality is seen. Hepatic steatosis. No gallstones, gallbladder wall thickening, or biliary dilatation. Pancreas: Unremarkable. No pancreatic ductal dilatation or surrounding inflammatory changes. Spleen: Normal in size without significant abnormality. Adrenals/Urinary Tract: Adrenal glands are unremarkable. Kidneys are normal, without renal calculi, solid lesion, or hydronephrosis. Bladder is unremarkable. Stomach/Bowel: Stomach is within normal limits. Appendix appears normal. No evidence of bowel wall thickening, distention, or inflammatory changes. Vascular/Lymphatic: Aortic atherosclerosis. No enlarged abdominal or pelvic lymph nodes. Redemonstrated postoperative findings of surgical resection in the left groin without noncontrast evidence of recurrent lymphadenopathy or soft tissue. Reproductive: No mass or other abnormality. Other: Fat containing umbilical  hernia.  No abdominopelvic ascites. Musculoskeletal: No acute or significant osseous findings. Status post left hip total arthroplasty. IMPRESSION: 1. Redemonstrated postoperative  findings of surgical resection in the left groin without noncontrast evidence of recurrent lymphadenopathy or soft tissue. 2. No noncontrast evidence of recurrent or metastatic disease in the chest, abdomen, or pelvis. 3. Hepatic steatosis. 4. Coronary artery disease. Aortic Atherosclerosis (ICD10-I70.0). Electronically Signed   By: Delanna Ahmadi M.D.   On: 01/20/2021 09:36        ASSESSMENT & PLAN:  1. Vitiligo   2. Malignant melanoma of overlapping sites ([place])   3. Encounter for antineoplastic immunotherapy   4. Spell of dizziness    #Left inguinal nodal recurrence of melanoma.  Status post resection and adjuvant radiation. Stage IV Recurrent, now in NED Labs reviewed and discussed with patient. Proceed with nivolumab maintenance today.  Chronic kidney disease, creatinine continues to fluctuate.  Recommend to avoid nephrotoxins and stay well-hydrated.  #Vitiligo, stable #Normocytic anemia, hemoglobin is at 11.5  Monitor. # Spells,  Obtain MRI brain.-Which is scheduled.  Etiology unknown.  Questionable seizure.  Recommend patient to avoid driving.  Follow-up 2 weeks at [facility] Nivolumab treatment. We spent sufficient time to discuss many aspect of care, questions were answered to patient's satisfaction.   [name], MD, PhD  02/22/2021

## 2021-02-22 NOTE — Patient Instructions (Signed)
[facility]  Discharge Instructions: Thank you for choosing [facility] to provide your oncology and hematology care.  If you have a lab appointment with the Cancer Center, please go directly to the Cancer Center and check in at the registration area.  Wear comfortable clothing and clothing appropriate for easy access to any Portacath or PICC line.   We strive to give you quality time with your provider. You may need to reschedule your appointment if you arrive late (15 or more minutes).  Arriving late affects you and other patients whose appointments are after yours.  Also, if you miss three or more appointments without notifying the office, you may be dismissed from the clinic at the provider's discretion.      For prescription refill requests, have your pharmacy contact our office and allow 72 hours for refills to be completed.    Today you received the following chemotherapy and/or immunotherapy agents Nivolumab       To help prevent nausea and vomiting after your treatment, we encourage you to take your nausea medication as directed.  BELOW ARE SYMPTOMS THAT SHOULD BE REPORTED IMMEDIATELY: *FEVER GREATER THAN 100.4 F (38 C) OR HIGHER *CHILLS OR SWEATING *NAUSEA AND VOMITING THAT IS NOT CONTROLLED WITH YOUR NAUSEA MEDICATION *UNUSUAL SHORTNESS OF BREATH *UNUSUAL BRUISING OR BLEEDING *URINARY PROBLEMS (pain or burning when urinating, or frequent urination) *BOWEL PROBLEMS (unusual diarrhea, constipation, pain near the anus) TENDERNESS IN MOUTH AND THROAT WITH OR WITHOUT PRESENCE OF ULCERS (sore throat, sores in mouth, or a toothache) UNUSUAL RASH, SWELLING OR PAIN  UNUSUAL VAGINAL DISCHARGE OR ITCHING   Items with * indicate a potential emergency and should be followed up as soon as possible or go to the Emergency Department if any problems should occur.  Please show the CHEMOTHERAPY ALERT CARD or IMMUNOTHERAPY ALERT CARD at check-in  to the Emergency Department and triage nurse.  Should you have questions after your visit or need to cancel or reschedule your appointment, please contact [facility] [place] [facility]  [phone] and follow the prompts.  Office hours are 8:00 a.m. to 4:30 p.m. Monday - Friday. Please note that voicemails left after 4:00 p.m. may not be returned until the following business day.  We are closed weekends and major holidays. You have access to a nurse at all times for urgent questions. Please call the main number to the clinic [phone] and follow the prompts.  For any non-urgent questions, you may also contact your provider using MyChart. We now offer e-Visits for anyone 18 and older to request care online for non-urgent symptoms. For details visit [email or web address].   Also download the MyChart app! Go to the app store, search "MyChart", open the app, select [facility], and log in with your MyChart username and password.  Due to Covid, a mask is required upon entering the hospital/clinic. If you do not have a mask, one will be given to you upon arrival. For doctor visits, patients may have 1 support person aged 18 or older with them. For treatment visits, patients cannot have anyone with them due to current Covid guidelines and our immunocompromised population.  

## 2021-03-08 ENCOUNTER — Encounter: Payer: Self-pay | Admitting: Oncology

## 2021-03-08 ENCOUNTER — Inpatient Hospital Stay

## 2021-03-08 ENCOUNTER — Other Ambulatory Visit: Payer: Self-pay

## 2021-03-08 ENCOUNTER — Inpatient Hospital Stay: Attending: Oncology

## 2021-03-08 ENCOUNTER — Inpatient Hospital Stay (HOSPITAL_BASED_OUTPATIENT_CLINIC_OR_DEPARTMENT_OTHER): Admitting: Oncology

## 2021-03-08 VITALS — BP 145/77 | HR 80 | Temp 97.6°F | Resp 18 | Wt 244.7 lb

## 2021-03-08 DIAGNOSIS — Z5112 Encounter for antineoplastic immunotherapy: Secondary | ICD-10-CM | POA: Diagnosis present

## 2021-03-08 DIAGNOSIS — L80 Vitiligo: Secondary | ICD-10-CM

## 2021-03-08 DIAGNOSIS — C438 Malignant melanoma of overlapping sites of skin: Secondary | ICD-10-CM | POA: Diagnosis not present

## 2021-03-08 DIAGNOSIS — R42 Dizziness and giddiness: Secondary | ICD-10-CM | POA: Insufficient documentation

## 2021-03-08 DIAGNOSIS — C4359 Malignant melanoma of other part of trunk: Secondary | ICD-10-CM | POA: Diagnosis present

## 2021-03-08 LAB — COMPREHENSIVE METABOLIC PANEL
ALT: 43 U/L (ref 0–44)
AST: 31 U/L (ref 15–41)
Albumin: 3.9 g/dL (ref 3.5–5.0)
Alkaline Phosphatase: 89 U/L (ref 38–126)
Anion gap: 8 (ref 5–15)
BUN: 24 mg/dL — ABNORMAL HIGH (ref 8–23)
CO2: 25 mmol/L (ref 22–32)
Calcium: 8.7 mg/dL — ABNORMAL LOW (ref 8.9–10.3)
Chloride: 104 mmol/L (ref 98–111)
Creatinine, Ser: 1.44 mg/dL — ABNORMAL HIGH (ref 0.61–1.24)
GFR, Estimated: 55 mL/min — ABNORMAL LOW (ref >60)
Glucose, Bld: 93 mg/dL (ref 70–99)
Potassium: 4 mmol/L (ref 3.5–5.1)
Sodium: 137 mmol/L (ref 135–145)
Total Bilirubin: 0.4 mg/dL (ref 0.3–1.2)
Total Protein: 6.8 g/dL (ref 6.5–8.1)

## 2021-03-08 LAB — CBC WITH DIFFERENTIAL/PLATELET
Abs Immature Granulocytes: 0.03 10*3/uL (ref 0.00–0.07)
Basophils Absolute: 0 10*3/uL (ref 0.0–0.1)
Basophils Relative: 0 %
Eosinophils Absolute: 0.1 10*3/uL (ref 0.0–0.5)
Eosinophils Relative: 3 %
HCT: 31.5 % — ABNORMAL LOW (ref 39.0–52.0)
Hemoglobin: 11 g/dL — ABNORMAL LOW (ref 13.0–17.0)
Immature Granulocytes: 1 %
Lymphocytes Relative: 22 %
Lymphs Abs: 1.2 10*3/uL (ref 0.7–4.0)
MCH: 31.1 pg (ref 26.0–34.0)
MCHC: 34.9 g/dL (ref 30.0–36.0)
MCV: 89 fL (ref 80.0–100.0)
Monocytes Absolute: 0.5 10*3/uL (ref 0.1–1.0)
Monocytes Relative: 10 %
Neutro Abs: 3.4 10*3/uL (ref 1.7–7.7)
Neutrophils Relative %: 64 %
Platelets: 172 10*3/uL (ref 150–400)
RBC: 3.54 MIL/uL — ABNORMAL LOW (ref 4.22–5.81)
RDW: 12.9 % (ref 11.5–15.5)
WBC: 5.3 10*3/uL (ref 4.0–10.5)
nRBC: 0 % (ref 0.0–0.2)

## 2021-03-08 MED ORDER — LIDOCAINE-PRILOCAINE 2.5-2.5 % EX CREA: 1.0000 "application " | TOPICAL_CREAM | CUTANEOUS | 2 refills | Status: DC | PRN

## 2021-03-08 MED ORDER — SODIUM CHLORIDE 0.9% FLUSH
10.0000 mL | INTRAVENOUS | Status: DC | PRN
  Filled 2021-03-08: qty 10

## 2021-03-08 MED ORDER — SODIUM CHLORIDE 0.9 % IV SOLN
240.0000 mg | Freq: Once | INTRAVENOUS | Status: AC
  Administered 2021-03-08: 240 mg via INTRAVENOUS
  Filled 2021-03-08: qty 24

## 2021-03-08 MED ORDER — HEPARIN SOD (PORK) LOCK FLUSH 100 UNIT/ML IV SOLN
500.0000 [IU] | Freq: Once | INTRAVENOUS | Status: AC | PRN
  Filled 2021-03-08: qty 5

## 2021-03-08 MED ORDER — SODIUM CHLORIDE 0.9 % IV SOLN
Freq: Once | INTRAVENOUS | Status: AC
  Filled 2021-03-08: qty 250

## 2021-03-08 MED ORDER — HEPARIN SOD (PORK) LOCK FLUSH 100 UNIT/ML IV SOLN
INTRAVENOUS | Status: AC
  Administered 2021-03-08: 500 [IU]
  Filled 2021-03-08: qty 5

## 2021-03-08 NOTE — Patient Instructions (Signed)
[facility] AT [place]  Discharge Instructions: Thank you for choosing [facility] to provide your oncology and hematology care.  If you have a lab appointment with the [facility], please go directly to the [facility] and check in at the registration area.  Wear comfortable clothing and clothing appropriate for easy access to any Portacath or PICC line.   We strive to give you quality time with your provider. You may need to reschedule your appointment if you arrive late (15 or more minutes).  Arriving late affects you and other patients whose appointments are after yours.  Also, if you miss three or more appointments without notifying the office, you may be dismissed from the clinic at the providers discretion.      For prescription refill requests, have your pharmacy contact our office and allow 72 hours for refills to be completed.    Today you received the following chemotherapy and/or immunotherapy agents - nivolumab      To help prevent nausea and vomiting after your treatment, we encourage you to take your nausea medication as directed.  BELOW ARE SYMPTOMS THAT SHOULD BE REPORTED IMMEDIATELY: *FEVER GREATER THAN 100.4 F (38 C) OR HIGHER *CHILLS OR SWEATING *NAUSEA AND VOMITING THAT IS NOT CONTROLLED WITH YOUR NAUSEA MEDICATION *UNUSUAL SHORTNESS OF BREATH *UNUSUAL BRUISING OR BLEEDING *URINARY PROBLEMS (pain or burning when urinating, or frequent urination) *BOWEL PROBLEMS (unusual diarrhea, constipation, pain near the anus) TENDERNESS IN MOUTH AND THROAT WITH OR WITHOUT PRESENCE OF ULCERS (sore throat, sores in mouth, or a toothache) UNUSUAL RASH, SWELLING OR PAIN  UNUSUAL VAGINAL DISCHARGE OR ITCHING   Items with * indicate a potential emergency and should be followed up as soon as possible or go to the Emergency Department if any problems should occur.  Please show the CHEMOTHERAPY ALERT CARD or IMMUNOTHERAPY ALERT CARD at check-in to  the Emergency Department and triage nurse.  Should you have questions after your visit or need to cancel or reschedule your appointment, please contact [facility] [place] [facility] [place]  [phone] and follow the prompts.  Office hours are 8:00 a.m. to 4:30 p.m. Monday - Friday. Please note that voicemails left after 4:00 p.m. may not be returned until the following business day.  We are closed weekends and major holidays. You have access to a nurse at all times for urgent questions. Please call the main number to the clinic [phone] and follow the prompts.  For any non-urgent questions, you may also contact your provider using MyChart. We now offer e-Visits for anyone 60 and older to request care online for non-urgent symptoms. For details visit [email or web address].   Also download the MyChart app! Go to the app store, search "MyChart", open the app, select [facility], and log in with your MyChart username and password.  Due to Covid, a mask is required upon entering the hospital/clinic. If you do not have a mask, one will be given to you upon arrival. For doctor visits, patients may have 1 support person aged 43 or older with them. For treatment visits, patients cannot have anyone with them due to current Covid guidelines and our immunocompromised population.   Nivolumab injection What is this medication? NIVOLUMAB (nye VOL ue mab) is a monoclonal antibody. It treats certain types of cancer. Some of the cancers treated are colon cancer, head and neck cancer, Hodgkin lymphoma, lung cancer, and melanoma. This medicine may be used for other purposes; ask your health care provider or pharmacist  if you have questions. COMMON BRAND NAME(S): Opdivo What should I tell my care team before I take this medication? They need to know if you have any of these conditions: Autoimmune diseases such as Crohn's disease, ulcerative colitis, or lupus Have had or planning to have an allogeneic  stem cell transplant (uses someone else's stem cells) History of chest radiation Organ transplant Nervous system problems such as myasthenia gravis or Guillain-Barre syndrome An unusual or allergic reaction to nivolumab, other medicines, foods, dyes, or preservatives Pregnant or trying to get pregnant Breast-feeding How should I use this medication? This medication is injected into a vein. It is given in a hospital or clinic setting. A special MedGuide will be given to you before each treatment. Be sure to read this information carefully each time. Talk to your care team regarding the use of this medication in children. While it may be prescribed for children as young as 12 years for selected conditions, precautions do apply. Overdosage: If you think you have taken too much of this medicine contact a poison control center or emergency room at once. NOTE: This medicine is only for you. Do not share this medicine with others. What if I miss a dose? Keep appointments for follow-up doses. It is important not to miss your dose. Call your care team if you are unable to keep an appointment. What may interact with this medication? Interactions have not been studied. This list may not describe all possible interactions. Give your health care provider a list of all the medicines, herbs, non-prescription drugs, or dietary supplements you use. Also tell them if you smoke, drink alcohol, or use illegal drugs. Some items may interact with your medicine. What should I watch for while using this medication? Your condition will be monitored carefully while you are receiving this medication. You may need blood work done while you are taking this medication. Do not become pregnant while taking this medication or for 5 months after stopping it. Women should inform their care team if they wish to become pregnant or think they might be pregnant. There is a potential for serious harm to an unborn child. Talk to your  care team for more information. Do not breast-feed an infant while taking this medication or for 5 months after stopping it. What side effects may I notice from receiving this medication? Side effects that you should report to your care team as soon as possible: Allergic reactions--skin rash, itching, hives, swelling of the face, lips, tongue, or throat Bloody or black, tar-like stools Change in vision Chest pain Diarrhea Dry cough, shortness of breath or trouble breathing Eye pain Fast or irregular heartbeat Fever, chills High blood sugar (hyperglycemia)--increased thirst or amount of urine, unusual weakness or fatigue, blurry vision High thyroid levels (hyperthyroidism)--fast or irregular heartbeat, weight loss, excessive sweating or sensitivity to heat, tremors or shaking, anxiety, nervousness, irregular menstrual cycle or spotting Kidney injury--decrease in the amount of urine, swelling of the ankles, hands, or feet Liver injury--right upper belly pain, loss of appetite, nausea, light-colored stool, dark yellow or brown urine, yellowing skin or eyes, unusual weakness or fatigue Low red blood cell count--unusual weakness or fatigue, dizziness, headache, trouble breathing Low thyroid levels (hypothyroidism)--unusual weakness or fatigue, increased sensitivity to cold, constipation, hair loss, dry skin, weight gain, feelings of depression Mood and behavior changes-confusion, change in sex drive or performance, irritability Muscle pain or cramps Pain, tingling, or numbness in the hands or feet, muscle weakness, trouble walking, loss of balance or  coordination Red or dark brown urine Redness, blistering, peeling, or loosening of the skin, including inside the mouth Stomach pain Unusual bruising or bleeding Side effects that usually do not require medical attention (report to your care team if they continue or are bothersome): Bone pain Constipation Loss of  appetite Nausea Tiredness Vomiting This list may not describe all possible side effects. Call your doctor for medical advice about side effects. You may report side effects to FDA at 1-800-FDA-1088. Where should I keep my medication? This medication is given in a hospital or clinic and will not be stored at home. NOTE: This sheet is a summary. It may not cover all possible information. If you have questions about this medicine, talk to your doctor, pharmacist, or health care provider.  2022 Elsevier/Gold Standard (2020-11-30 00:00:00)

## 2021-03-08 NOTE — Progress Notes (Signed)
Pt here for follow up. No new concerns voiced.   

## 2021-03-08 NOTE — Progress Notes (Signed)
Hematology/Oncology follow up  note [phone] [phone]   Patient Care Team: Miki Kins, FNP as PCP - General (Family Medicine) Rickard Patience, MD as Consulting Physician (Oncology)  REFERRING PROVIDER: Miki Kins, FNP  CHIEF COMPLAINTS/REASON FOR VISIT:  Follow up for melanoma HISTORY OF PRESENTING ILLNESS:   Edward Pitts is a  60 y.o.  male with PMH listed below was seen in consultation at the request of  Miki Kins, FNP  for evaluation of inguinal mass Patient presented to emergency room 3 days ago complaining about left ing uinal mass discomfort. Reports that he has really noticed the mass growing for the past 1 months. He has a history of left lower extremity melanoma in 2011, status post local excision.  Pain was increased with squatting of laxation. He was advised to take Tylenol for pain. Denies any fever, chills, night sweating.  He does feel mild nauseated. Appetite is fair.  He has lost about 10 pounds since earlier this year. 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  Left inguinal and left iliac nodes which are suspicious for involvement.  There are also 2 small nonspecific hypodense lesions within the right liver, nonspecific.  # patient underwent left groin mass resection on 04/16/2019. Resection pathology showed malignant melanoma, replacing a lymph node, with extracapsular extension, peripheral and deep margins involved.  Left inguinal contents, all 7 lymph nodes were negative for melanoma in the lymph nodes.  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  # PDL1 80% TPS  # 04/16/2019. underwent left groin mass resection   07/07/2019  Status post adjuvant radiation and finished radiation  Patient has Mediport placed to facilitate immunotherapy treatments.  06/29/2020, CT chest abdomen pelvis showed  stable postoperative appearance of the left groin.  No evidence of local recurrence.  No evidence of metastatic disease in the chest abdomen or pelvis.  Hepatic steatosis.  Stable subcentimeter fluid attenuation lesion of the lateral right lobe of the liver, likely benign cyst or hemangioma.  Coronary artery disease.  Aortic atherosclerosis  10/20/2020, CT chest abdomen pelvis showed stable postoperative/radiation appearance of the left groin.  No evidence of local recurrence/metastatic disease within the chest abdomen/pelvis.  Fatty liver disease.  Diverticulosis without evidence of typhlitis.  Aortic atherosclerosis  INTERVAL HISTORY Edward Pitts is a 60 y.o. male who has above history reviewed by me today presents for follow up visit for management of inguinal nodal recurrence of melanoma Patient has been off Keppra due to not able to tolerate. MRI has been scheduled in December.  Patient has no new complaints today.  Patient continues to have spells. he request nystatin powder refills. . :Review of Systems  Constitutional:  Negative for appetite change, chills, fatigue, fever and unexpected weight change.  HENT:   Negative for hearing loss and voice change.   Eyes:  Negative for eye problems and icterus.  Respiratory:  Negative for chest tightness, cough and shortness of breath.   Cardiovascular:  Negative for chest pain and leg swelling.  Gastrointestinal:  Negative for abdominal distention and abdominal pain.  Endocrine: Negative for hot flashes.  Genitourinary:  Negative for difficulty urinating, dysuria and frequency.   Musculoskeletal:        Status post left hip replacement  Skin:  Negative for itching and rash.       Skin hypo-pigmentation on upper extremities, no change  Neurological:  Negative for light-headedness and numbness.  Hematological:  Negative for adenopathy. Does not bruise/bleed easily.  Psychiatric/Behavioral:  Negative for confusion.    MEDICAL HISTORY:  Past  Medical History:  Diagnosis Date   Anemia    iron treatments   Anxiety    Aortic atherosclerosis (HCC)    Arthritis    Cancer of groin ([place]) 2021   left groin, resected, radiation   Cataract    Complication of anesthesia    PONV   Coronary artery disease    Dizziness of unknown etiology    has led to seizures and passing out.   Family history of adverse reaction to anesthesia    PONV mother   GERD (gastroesophageal reflux disease)    History of complete heart block    PPM placed   Hyperlipidemia    Hypertension    LBBB (left bundle branch block)    Lymphedema of left leg    uses thigh high compression stockings   Melanoma ([place]) 2012   skin cancer, left thigh   OSA on CPAP    PONV (postoperative nausea and vomiting) 04/16/2019   Port-A-Cath in place    RIGHT chest wall   Presence of cardiac pacemaker    [employer]   Seizures ([place])    still has episodes of dizziness. last event 1 month ago (march 2022) and will pass out. takes clonazepam    SURGICAL HISTORY: Past Surgical History:  Procedure Laterality Date   CT RADIATION THERAPY GUIDE     left groin   dental implant     permanent implant   KNEE SURGERY Left    arthroscopy   LEFT HEART CATH AND CORONARY ANGIOGRAPHY Left 06/29/2017   Procedure: LEFT HEART CATH AND CORONARY ANGIOGRAPHY;  Surgeon: [name], MD;  Location: [facility];  Service: Cardiovascular;  Laterality: Left;   LYMPH NODE DISSECTION Left 04/16/2019   Procedure: Left inguinal Lymph Node Dissection;  Surgeon: [name], MD;  Location: [facility];  Service: General;  Laterality: Left;   MELANOMA EXCISION Left 04/16/2019   Procedure: MELANOMA EXCISION LEFT GROIN MASS;  Surgeon: [name], MD;  Location: [facility];  Service: General;  Laterality: Left;   MELANOMA EXCISION WITH SENTINEL LYMPH NODE BIOPSY Left 2012   Left calf    PACEMAKER INSERTION N/A 08/26/2018   Procedure: INSERTION PACEMAKER;  Surgeon: [name], MD;  Location:  [facility];  Service: Cardiovascular;  Laterality: N/A;   PORTA CATH INSERTION N/A 08/26/2019   Procedure: PORTA CATH INSERTION;  Surgeon: Katha Cabal, MD;  Location: [facility];  Service: Cardiovascular;  Laterality: N/A;   SUPERFICIAL LYMPH NODE BIOPSY / EXCISION Left 2020   lymph nodes removed around left groin melanoma site   TEMPORARY PACEMAKER N/A 08/25/2018   Procedure: TEMPORARY PACEMAKER;  Surgeon: [name], MD;  Location: [facility];  Service: Cardiovascular;  Laterality: N/A;   TOTAL HIP ARTHROPLASTY Left 07/14/2020   Procedure: TOTAL HIP ARTHROPLASTY;  Surgeon: Dereck Leep, MD;  Location: [facility];  Service: Orthopedics;  Laterality: Left;    SOCIAL HISTORY: Social History   Socioeconomic History   Marital status: Married    Spouse name: Vicente Males    Number of children: 7   Years of education: 12   Highest education level: Not on file  Occupational History    Comment: disability  Tobacco Use   Smoking status: Never   Smokeless tobacco: Never  Vaping Use   Vaping  Use: Never used  Substance and Sexual Activity   Alcohol use: No   Drug use: No   Sexual activity: Not Currently  Other Topics Concern   Not on file  Social History Narrative   Lives with  Wife,   Has 2 small dogs   Caffeine use: sodas (2 per day)      Out of work on disability.  Has a walk in shower. No stairs to climb   Oncology treatment ongoing. Uses port a cath for treatment.      pacemaker   Social Determinants of Health   Financial Resource Strain: Not on file  Food Insecurity: Not on file  Transportation Needs: Not on file  Physical Activity: Not on file  Stress: Not on file  Social Connections: Not on file  Intimate Partner Violence: Not on file    FAMILY HISTORY: Family History  Problem Relation Age of Onset   Cancer Paternal Grandmother     ALLERGIES:  is allergic to ibuprofen and nsaids.  MEDICATIONS:  Current Outpatient Medications  Medication  Sig Dispense Refill   allopurinol (ZYLOPRIM) 300 MG tablet Take 300 mg by mouth daily.     ASPIRIN 81 PO Take 1 tablet by mouth daily.     atorvastatin (LIPITOR) 20 MG tablet Take 20 mg by mouth daily.     clonazePAM (KLONOPIN) 0.5 MG tablet 1 tablet in the morning, 2 in the evening 90 tablet 3   hydrocortisone 2.5 % ointment Apply topically 2 (two) times daily.     ketoconazole (NIZORAL) 2 % cream Apply 1 application topically daily.     lidocaine-prilocaine (EMLA) cream Apply 1 application topically as needed. Apply small amount of cream to port site approx 1-2 hours prior to appointment. 30 g 2   lisinopril (ZESTRIL) 20 MG tablet Take 20 mg by mouth daily.     metoprolol succinate (TOPROL-XL) 25 MG 24 hr tablet Take 25 mg by mouth daily.      Multiple Vitamin (MULTIVITAMIN WITH MINERALS) TABS tablet Take 1 tablet by mouth daily. Centrum Silver     nystatin (MYCOSTATIN/NYSTOP) powder Apply 1 application topically 3 (three) times daily. Apply small amount to affected area 3 times a day until healed 60 g 2   omeprazole (PRILOSEC) 20 MG capsule TAKE 1 CAPSULE BY MOUTH EVERY DAY 30 capsule 1   ondansetron (ZOFRAN) 4 MG tablet TAKE 1 TABLET BY MOUTH EVERY 8 HOURS AS NEEDED FOR NAUSEA AND VOMITING 90 tablet 1   oxyCODONE (OXY IR/ROXICODONE) 5 MG immediate release tablet Take 1 tablet (5 mg total) by mouth every 4 (four) hours as needed for moderate pain (pain score 4-6). (Patient not taking: Reported on 11/30/2020) 30 tablet 0   No current facility-administered medications for this visit.     PHYSICAL EXAMINATION: ECOG PERFORMANCE STATUS: 1 - Symptomatic but completely ambulatory Vitals:   03/08/21 1323  BP: (!) 145/77  Pulse: 80  Resp: 18  Temp: 97.6 F (36.4 C)   Filed Weights   03/08/21 1323  Weight: 244 lb 11.2 oz (111 kg)    Physical Exam Constitutional:      General: He is not in acute distress.    Comments: Patient ambulates independently  HENT:     Head: Normocephalic and  atraumatic.  Eyes:     General: No scleral icterus.    Pupils: Pupils are equal, round, and reactive to light.  Cardiovascular:     Rate and Rhythm: Normal rate and regular rhythm.  Heart sounds: Normal heart sounds.  Pulmonary:     Effort: Pulmonary effort is normal. No respiratory distress.     Breath sounds: No wheezing.  Abdominal:     General: Bowel sounds are normal. There is no distension.     Palpations: Abdomen is soft. There is no mass.     Tenderness: There is no abdominal tenderness.     Comments:    Musculoskeletal:        General: No deformity. Normal range of motion.     Cervical back: Normal range of motion and neck supple.     Comments: Left lower extremity edema Left hip replacement  Skin:    General: Skin is warm and dry.  Neurological:     Mental Status: He is alert and oriented to person, place, and time. Mental status is at baseline.     Cranial Nerves: No cranial nerve deficit.     Coordination: Coordination normal.  Psychiatric:        Mood and Affect: Mood normal.    LABORATORY DATA:  I have reviewed the data as listed Lab Results  Component Value Date   WBC 5.3 03/08/2021   HGB 11.0 (L) 03/08/2021   HCT 31.5 (L) 03/08/2021   MCV 89.0 03/08/2021   PLT 172 03/08/2021   Recent Labs    02/08/21 1400 02/22/21 1300 03/08/21 1310  NA 136 136 137  K 4.1 4.9 4.0  CL 104 105 104  CO2 $Re'22 23 25  'BvA$ GLUCOSE 117* 96 93  BUN 31* 40* 24*  CREATININE 1.04 1.39* 1.44*  CALCIUM 9.0 9.5 8.7*  GFRNONAA >60 58* 55*  PROT 7.3 7.1 6.8  ALBUMIN 4.0 4.3 3.9  AST $Re'26 27 31  'VRB$ ALT 32 32 43  ALKPHOS 93 90 89  BILITOT 0.3 0.5 0.4    Iron/TIBC/Ferritin/ %Sat    Component Value Date/Time   IRON 85 10/02/2019 0841   TIBC 342 10/02/2019 0841   FERRITIN 61 10/02/2019 0841   IRONPCTSAT 25 10/02/2019 0841      RADIOGRAPHIC STUDIES: I have personally reviewed the radiological images as listed and agreed with the findings in the report. CT CHEST ABDOMEN  PELVIS WO CONTRAST  Result Date: 01/20/2021 CLINICAL DATA:  Melanoma, metastases to the left groin, status post resection EXAM: CT CHEST, ABDOMEN AND PELVIS WITHOUT CONTRAST TECHNIQUE: Multidetector CT imaging of the chest, abdomen and pelvis was performed following the standard protocol without IV contrast. COMPARISON:  10/20/2020 FINDINGS: CT CHEST FINDINGS Cardiovascular: Right chest port catheter. Aortic atherosclerosis. Left chest multi lead pacer. Normal heart size. Left and right coronary artery calcification. No pericardial effusion. Mediastinum/Nodes: No enlarged mediastinal, hilar, or axillary lymph nodes. Thyroid gland, trachea, and esophagus demonstrate no significant findings. Lungs/Pleura: Stable, benign 2 mm fissural nodule superior segment left lower lobe (series 3, image 76). No pleural effusion or pneumothorax. Musculoskeletal: No chest wall mass or suspicious bone lesions identified. CT ABDOMEN PELVIS FINDINGS Hepatobiliary: No solid liver abnormality is seen. Hepatic steatosis. No gallstones, gallbladder wall thickening, or biliary dilatation. Pancreas: Unremarkable. No pancreatic ductal dilatation or surrounding inflammatory changes. Spleen: Normal in size without significant abnormality. Adrenals/Urinary Tract: Adrenal glands are unremarkable. Kidneys are normal, without renal calculi, solid lesion, or hydronephrosis. Bladder is unremarkable. Stomach/Bowel: Stomach is within normal limits. Appendix appears normal. No evidence of bowel wall thickening, distention, or inflammatory changes. Vascular/Lymphatic: Aortic atherosclerosis. No enlarged abdominal or pelvic lymph nodes. Redemonstrated postoperative findings of surgical resection in the left groin without noncontrast evidence of  recurrent lymphadenopathy or soft tissue. Reproductive: No mass or other abnormality. Other: Fat containing umbilical hernia.  No abdominopelvic ascites. Musculoskeletal: No acute or significant osseous findings.  Status post left hip total arthroplasty. IMPRESSION: 1. Redemonstrated postoperative findings of surgical resection in the left groin without noncontrast evidence of recurrent lymphadenopathy or soft tissue. 2. No noncontrast evidence of recurrent or metastatic disease in the chest, abdomen, or pelvis. 3. Hepatic steatosis. 4. Coronary artery disease. Aortic Atherosclerosis (ICD10-I70.0). Electronically Signed   By: Delanna Ahmadi M.D.   On: 01/20/2021 09:36        ASSESSMENT & PLAN:  1. Encounter for antineoplastic immunotherapy   2. Malignant melanoma of overlapping sites ([place])   3. Vitiligo   4. Spell of dizziness    #Left inguinal nodal recurrence of melanoma.  Status post resection and adjuvant radiation. Stage IV Recurrent, now in NED Labs are reviewed and discussed with patient.  Proceed with nivolumab maintenance today.  Chronic kidney disease, creatinine continues to fluctuate.  Recommend to avoid nephrotoxins and stay well-hydrated.  #Vitiligo, stable #Normocytic anemia, hemoglobin is at 11.5  Monitor. # Spells,  Obtain MRI brain.- scheduled.  Etiology unknown.  Questionable seizure.  Recommend patient to avoid driving.  Follow-up 2 weeks at [facility] Nivolumab treatment. We spent sufficient time to discuss many aspect of care, questions were answered to patient's satisfaction.   [name], MD, PhD  03/08/2021

## 2021-03-09 ENCOUNTER — Ambulatory Visit (HOSPITAL_COMMUNITY)
Admission: RE | Admit: 2021-03-09 | Discharge: 2021-03-09 | Disposition: A | Discharge status: Home / Self Care | Source: Ambulatory Visit | Attending: Oncology | Admitting: Oncology

## 2021-03-09 DIAGNOSIS — C438 Malignant melanoma of overlapping sites of skin: Secondary | ICD-10-CM | POA: Diagnosis present

## 2021-03-09 NOTE — Progress Notes (Signed)
Per order, Changed device settings for MRI to DOO at 85 bpm   Tachy-therapies to off if applicable.   Will program device back to pre-MRI settings after completion of exam. 

## 2021-03-09 NOTE — Progress Notes (Signed)
Informed of MRI for today.   Device system confirmed to be MRI conditional, with implant date > 6 weeks ago, and no evidence of abandoned or epicardial leads in review of most recent CXR Interrogation from today reviewed, pt is currently AP-VP at ~70 bpm Change device settings for MRI to DOO at 85 bpm  Tachy-therapies to off if applicable.  Program device back to pre-MRI settings after completion of exam.  Annamaria Helling  03/09/2021 12:45 PM

## 2021-03-11 ENCOUNTER — Telehealth: Payer: Self-pay | Admitting: *Deleted

## 2021-03-11 ENCOUNTER — Telehealth: Payer: Self-pay | Admitting: Oncology

## 2021-03-11 NOTE — Telephone Encounter (Signed)
Call returned to patient and advised of reason for non contrasted MRI. He was accepting of this explanation and then asked if he is going to be referred to a Neurologist based on the results of the MRI  CLINICAL DATA:  Malignant melanoma of overlapping sites. Skin cancer, monitor; Melanoma.   EXAM: MRI HEAD WITHOUT CONTRAST   TECHNIQUE: Multiplanar, multiecho pulse sequences of the brain and surrounding structures were obtained without intravenous contrast.   COMPARISON:  MRI of the brain March 10, 2019.   FINDINGS: Brain: No acute infarction, hemorrhage, hydrocephalus, extra-axial collection or mass lesion. Minimal amount of punctate foci T2 hyperintensity within the white matter of the cerebral hemispheres nonspecific, may represent early chronic microangiopathy.   Vascular: Normal flow voids.   Skull and upper cervical spine: Normal marrow signal.   Sinuses/Orbits: Mucosal thickening of the right maxillary sinus. The orbits are.   Other: Right mastoid effusion.   IMPRESSION: No definitive evidence of intracranial metastatic disease. However, the study is limited by absence of intravenous contrast. Small lesions could be missed.     Electronically Signed   By: Pedro Earls M.D.   On: 03/10/2021 12:08

## 2021-03-11 NOTE — Telephone Encounter (Signed)
Patient called concerned that his MRI was without contrast and he was told that if we had ordered contrast, they would be able to see more in depth. He is asking if insurance would pay for him to have another MRI, but with contrast. Please advise  EXAM: MRI HEAD WITHOUT CONTRAST   TECHNIQUE: Multiplanar, multiecho pulse sequences of the brain and surrounding structures were obtained without intravenous contrast.   COMPARISON:  MRI of the brain March 10, 2019.   FINDINGS: Brain: No acute infarction, hemorrhage, hydrocephalus, extra-axial collection or mass lesion. Minimal amount of punctate foci T2 hyperintensity within the white matter of the cerebral hemispheres nonspecific, may represent early chronic microangiopathy.   Vascular: Normal flow voids.   Skull and upper cervical spine: Normal marrow signal.   Sinuses/Orbits: Mucosal thickening of the right maxillary sinus. The orbits are.   Other: Right mastoid effusion.   IMPRESSION: No definitive evidence of intracranial metastatic disease. However, the study is limited by absence of intravenous contrast. Small lesions could be missed.     Electronically Signed   By: Pedro Earls M.D.   On: 03/10/2021 12:08

## 2021-03-11 NOTE — Telephone Encounter (Signed)
Pt called to reschedule his 03-30-21 appt. Anything after the 4th. Call back at [phone]

## 2021-03-11 NOTE — Telephone Encounter (Signed)
Per MD, MRI wo was done due to pt's kidney funciton. No plan to repeat MRI for now but she will discuss with him in furhter detail at next appt.

## 2021-03-14 ENCOUNTER — Telehealth: Payer: Self-pay | Admitting: Neurology

## 2021-03-14 NOTE — Telephone Encounter (Signed)
Pt called states his Cancer doctor ordered him a MRI and the results showed some abnormalities. Pt asking if we could go over the results with him. Pt requesting a call back.

## 2021-03-14 NOTE — Telephone Encounter (Signed)
I spoke to the patient. He completed his MRI brain scan on 03/19/21. He read the results on his mychart account and has questions. This scan was ordered by Dr. [name]. He was instructed to call her office for further discussion of the findings. He was in agreement with this plan.

## 2021-03-16 ENCOUNTER — Encounter: Payer: Self-pay | Admitting: Oncology

## 2021-03-19 ENCOUNTER — Other Ambulatory Visit: Payer: Self-pay | Admitting: Oncology

## 2021-03-22 ENCOUNTER — Encounter: Payer: Self-pay | Admitting: Oncology

## 2021-03-30 ENCOUNTER — Ambulatory Visit: Admitting: Oncology

## 2021-03-30 ENCOUNTER — Other Ambulatory Visit

## 2021-03-30 ENCOUNTER — Ambulatory Visit

## 2021-04-01 ENCOUNTER — Other Ambulatory Visit: Payer: Self-pay

## 2021-04-01 ENCOUNTER — Inpatient Hospital Stay: Payer: Medicaid Other

## 2021-04-01 ENCOUNTER — Encounter: Payer: Self-pay | Admitting: Oncology

## 2021-04-01 ENCOUNTER — Inpatient Hospital Stay: Payer: Medicaid Other | Admitting: Oncology

## 2021-04-01 ENCOUNTER — Inpatient Hospital Stay: Payer: Medicaid Other | Attending: Oncology

## 2021-04-01 ENCOUNTER — Inpatient Hospital Stay (HOSPITAL_BASED_OUTPATIENT_CLINIC_OR_DEPARTMENT_OTHER): Payer: Medicaid Other | Admitting: Oncology

## 2021-04-01 VITALS — BP 154/77 | HR 77 | Temp 96.9°F | Wt 246.0 lb

## 2021-04-01 DIAGNOSIS — C438 Malignant melanoma of overlapping sites of skin: Secondary | ICD-10-CM

## 2021-04-01 DIAGNOSIS — E1165 Type 2 diabetes mellitus with hyperglycemia: Secondary | ICD-10-CM | POA: Insufficient documentation

## 2021-04-01 DIAGNOSIS — Z79899 Other long term (current) drug therapy: Secondary | ICD-10-CM | POA: Diagnosis not present

## 2021-04-01 DIAGNOSIS — G40909 Epilepsy, unspecified, not intractable, without status epilepticus: Secondary | ICD-10-CM | POA: Insufficient documentation

## 2021-04-01 DIAGNOSIS — Z5112 Encounter for antineoplastic immunotherapy: Secondary | ICD-10-CM | POA: Insufficient documentation

## 2021-04-01 DIAGNOSIS — N189 Chronic kidney disease, unspecified: Secondary | ICD-10-CM | POA: Insufficient documentation

## 2021-04-01 DIAGNOSIS — R42 Dizziness and giddiness: Secondary | ICD-10-CM | POA: Diagnosis not present

## 2021-04-01 DIAGNOSIS — R739 Hyperglycemia, unspecified: Secondary | ICD-10-CM

## 2021-04-01 DIAGNOSIS — I129 Hypertensive chronic kidney disease with stage 1 through stage 4 chronic kidney disease, or unspecified chronic kidney disease: Secondary | ICD-10-CM | POA: Insufficient documentation

## 2021-04-01 DIAGNOSIS — C4359 Malignant melanoma of other part of trunk: Secondary | ICD-10-CM | POA: Insufficient documentation

## 2021-04-01 DIAGNOSIS — E1122 Type 2 diabetes mellitus with diabetic chronic kidney disease: Secondary | ICD-10-CM | POA: Insufficient documentation

## 2021-04-01 DIAGNOSIS — D649 Anemia, unspecified: Secondary | ICD-10-CM | POA: Insufficient documentation

## 2021-04-01 LAB — CBC WITH DIFFERENTIAL/PLATELET
Abs Immature Granulocytes: 0.05 10*3/uL (ref 0.00–0.07)
Basophils Absolute: 0 10*3/uL (ref 0.0–0.1)
Basophils Relative: 1 %
Eosinophils Absolute: 0.2 10*3/uL (ref 0.0–0.5)
Eosinophils Relative: 3 %
HCT: 33 % — ABNORMAL LOW (ref 39.0–52.0)
Hemoglobin: 11.1 g/dL — ABNORMAL LOW (ref 13.0–17.0)
Immature Granulocytes: 1 %
Lymphocytes Relative: 24 %
Lymphs Abs: 1.2 10*3/uL (ref 0.7–4.0)
MCH: 30.5 pg (ref 26.0–34.0)
MCHC: 33.6 g/dL (ref 30.0–36.0)
MCV: 90.7 fL (ref 80.0–100.0)
Monocytes Absolute: 0.5 10*3/uL (ref 0.1–1.0)
Monocytes Relative: 10 %
Neutro Abs: 3.1 10*3/uL (ref 1.7–7.7)
Neutrophils Relative %: 61 %
Platelets: 173 10*3/uL (ref 150–400)
RBC: 3.64 MIL/uL — ABNORMAL LOW (ref 4.22–5.81)
RDW: 12.9 % (ref 11.5–15.5)
WBC: 5.1 10*3/uL (ref 4.0–10.5)
nRBC: 0 % (ref 0.0–0.2)

## 2021-04-01 LAB — COMPREHENSIVE METABOLIC PANEL
ALT: 31 U/L (ref 0–44)
AST: 26 U/L (ref 15–41)
Albumin: 4 g/dL (ref 3.5–5.0)
Alkaline Phosphatase: 97 U/L (ref 38–126)
Anion gap: 7 (ref 5–15)
BUN: 34 mg/dL — ABNORMAL HIGH (ref 8–23)
CO2: 24 mmol/L (ref 22–32)
Calcium: 9.2 mg/dL (ref 8.9–10.3)
Chloride: 106 mmol/L (ref 98–111)
Creatinine, Ser: 1.39 mg/dL — ABNORMAL HIGH (ref 0.61–1.24)
GFR, Estimated: 58 mL/min — ABNORMAL LOW (ref >60)
Glucose, Bld: 137 mg/dL — ABNORMAL HIGH (ref 70–99)
Potassium: 4.2 mmol/L (ref 3.5–5.1)
Sodium: 137 mmol/L (ref 135–145)
Total Bilirubin: 0.5 mg/dL (ref 0.3–1.2)
Total Protein: 7.2 g/dL (ref 6.5–8.1)

## 2021-04-01 LAB — HEMOGLOBIN A1C
Hgb A1c MFr Bld: 5.1 % (ref 4.8–5.6)
Mean Plasma Glucose: 99.67 mg/dL

## 2021-04-01 MED ORDER — HEPARIN SOD (PORK) LOCK FLUSH 100 UNIT/ML IV SOLN
500.0000 [IU] | Freq: Once | INTRAVENOUS | Status: AC | PRN
  Administered 2021-04-01: 500 [IU]
  Filled 2021-04-01: qty 5

## 2021-04-01 MED ORDER — SODIUM CHLORIDE 0.9 % IV SOLN
Freq: Once | INTRAVENOUS | Status: AC
  Filled 2021-04-01: qty 250

## 2021-04-01 MED ORDER — SODIUM CHLORIDE 0.9 % IV SOLN
240.0000 mg | Freq: Once | INTRAVENOUS | Status: AC
  Administered 2021-04-01: 240 mg via INTRAVENOUS
  Filled 2021-04-01: qty 24

## 2021-04-01 NOTE — Patient Instructions (Signed)
[facility]  Discharge Instructions: ?Thank you for choosing [facility] to provide your oncology and hematology care.  ?If you have a lab appointment with the Cancer Center, please go directly to the Cancer Center and check in at the registration area. ? ?Wear comfortable clothing and clothing appropriate for easy access to any Portacath or PICC line.  ? ?We strive to give you quality time with your provider. You may need to reschedule your appointment if you arrive late (15 or more minutes).  Arriving late affects you and other patients whose appointments are after yours.  Also, if you miss three or more appointments without notifying the office, you may be dismissed from the clinic at the provider?s discretion.    ?  ?For prescription refill requests, have your pharmacy contact our office and allow 72 hours for refills to be completed.   ? ?Today you received the following chemotherapy and/or immunotherapy agents: Opdivo    ?  ?To help prevent nausea and vomiting after your treatment, we encourage you to take your nausea medication as directed. ? ?BELOW ARE SYMPTOMS THAT SHOULD BE REPORTED IMMEDIATELY: ?*FEVER GREATER THAN 100.4 F (38 ?C) OR HIGHER ?*CHILLS OR SWEATING ?*NAUSEA AND VOMITING THAT IS NOT CONTROLLED WITH YOUR NAUSEA MEDICATION ?*UNUSUAL SHORTNESS OF BREATH ?*UNUSUAL BRUISING OR BLEEDING ?*URINARY PROBLEMS (pain or burning when urinating, or frequent urination) ?*BOWEL PROBLEMS (unusual diarrhea, constipation, pain near the anus) ?TENDERNESS IN MOUTH AND THROAT WITH OR WITHOUT PRESENCE OF ULCERS (sore throat, sores in mouth, or a toothache) ?UNUSUAL RASH, SWELLING OR PAIN  ?UNUSUAL VAGINAL DISCHARGE OR ITCHING  ? ?Items with * indicate a potential emergency and should be followed up as soon as possible or go to the Emergency Department if any problems should occur. ? ?Please show the CHEMOTHERAPY ALERT CARD or IMMUNOTHERAPY ALERT CARD at check-in to the  Emergency Department and triage nurse. ? ?Should you have questions after your visit or need to cancel or reschedule your appointment, please contact [facility]  [phone] and follow the prompts.  Office hours are 8:00 a.m. to 4:30 p.m. Monday - Friday. Please note that voicemails left after 4:00 p.m. may not be returned until the following business day.  We are closed weekends and major holidays. You have access to a nurse at all times for urgent questions. Please call the main number to the clinic [phone] and follow the prompts. ? ?For any non-urgent questions, you may also contact your provider using MyChart. We now offer e-Visits for anyone 18 and older to request care online for non-urgent symptoms. For details visit [email or web address]. ?  ?Also download the MyChart app! Go to the app store, search "MyChart", open the app, select [facility], and log in with your MyChart username and password. ? ?Due to Covid, a mask is required upon entering the hospital/clinic. If you do not have a mask, one will be given to you upon arrival. For doctor visits, patients may have 1 support person aged 18 or older with them. For treatment visits, patients cannot have anyone with them due to current Covid guidelines and our immunocompromised population.  ?

## 2021-04-01 NOTE — Progress Notes (Signed)
Hematology/Oncology follow up  note [phone] [phone]   Patient Care Team: Mechele Claude, FNP as PCP - General (Family Medicine) [name], MD as Consulting Physician (Oncology)  REFERRING PROVIDER: Mechele Claude, FNP  CHIEF COMPLAINTS/REASON FOR VISIT:  Follow up for melanoma HISTORY OF PRESENTING ILLNESS:   Edward Pitts is a  61 y.o.  male with PMH listed below was seen in consultation at the request of  Mechele Claude, FNP  for evaluation of inguinal mass Patient presented to emergency room 3 days ago complaining about left ing uinal mass discomfort. Reports that he has really noticed the mass growing for the past 1 months. He has a history of left lower extremity melanoma in 2011, status post local excision.  Pain was increased with squatting of laxation. He was advised to take Tylenol for pain. Denies any fever, chills, night sweating.  He does feel mild nauseated. Appetite is fair.  He has lost about 10 pounds since earlier this year. 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  Left inguinal and left iliac nodes which are suspicious for involvement.  There are also 2 small nonspecific hypodense lesions within the right liver, nonspecific.  # patient underwent left groin mass resection on 04/16/2019. Resection pathology showed malignant melanoma, replacing a lymph node, with extracapsular extension, peripheral and deep margins involved.  Left inguinal contents, all 7 lymph nodes were negative for melanoma in the lymph nodes.  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  # PDL1 80% TPS  # 04/16/2019. underwent left groin mass resection   07/07/2019  Status post adjuvant radiation and finished radiation  Patient has Mediport placed to facilitate immunotherapy treatments.  06/29/2020, CT chest abdomen pelvis showed  stable postoperative appearance of the left groin.  No evidence of local recurrence.  No evidence of metastatic disease in the chest abdomen or pelvis.  Hepatic steatosis.  Stable subcentimeter fluid attenuation lesion of the lateral right lobe of the liver, likely benign cyst or hemangioma.  Coronary artery disease.  Aortic atherosclerosis  10/20/2020, CT chest abdomen pelvis showed stable postoperative/radiation appearance of the left groin.  No evidence of local recurrence/metastatic disease within the chest abdomen/pelvis.  Fatty liver disease.  Diverticulosis without evidence of typhlitis.  Aortic atherosclerosis  INTERVAL HISTORY Edward Pitts is a 61 y.o. male who has above history reviewed by me today presents for follow up visit for management of inguinal nodal recurrence of melanoma Patient has been off Keppra due to not able to tolerate. 03/10/2021 MRI brain without contrast showed no definitive evidence of intracranial metastatic disease.  Study is limited by absence of intravenous contrast. he continues to have dizzy spells. 11 has no new complaints.  No nausea vomiting diarrhea, fever or chills. . :Review of Systems  Constitutional:  Negative for appetite change, chills, fatigue, fever and unexpected weight change.  HENT:   Negative for hearing loss and voice change.   Eyes:  Negative for eye problems and icterus.  Respiratory:  Negative for chest tightness, cough and shortness of breath.   Cardiovascular:  Negative for chest pain and leg swelling.  Gastrointestinal:  Negative for abdominal distention and abdominal pain.  Endocrine: Negative for hot flashes.  Genitourinary:  Negative for difficulty urinating, dysuria and frequency.   Musculoskeletal:        Status post left hip replacement  Skin:  Negative for  itching and rash.       Skin hypo-pigmentation on upper extremities, no change  Neurological:  Negative for light-headedness and numbness.  Hematological:  Negative for  adenopathy. Does not bruise/bleed easily.  Psychiatric/Behavioral:  Negative for confusion.    MEDICAL HISTORY:  Past Medical History:  Diagnosis Date   Anemia    iron treatments   Anxiety    Aortic atherosclerosis (HCC)    Arthritis    Cancer of groin ([place]) 2021   left groin, resected, radiation   Cataract    Complication of anesthesia    PONV   Coronary artery disease    Dizziness of unknown etiology    has led to seizures and passing out.   Family history of adverse reaction to anesthesia    PONV mother   GERD (gastroesophageal reflux disease)    History of complete heart block    PPM placed   Hyperlipidemia    Hypertension    LBBB (left bundle branch block)    Lymphedema of left leg    uses thigh high compression stockings   Melanoma ([place]) 2012   skin cancer, left thigh   OSA on CPAP    PONV (postoperative nausea and vomiting) 04/16/2019   Port-A-Cath in place    RIGHT chest wall   Presence of cardiac pacemaker    [employer]   Seizures ([place])    still has episodes of dizziness. last event 1 month ago (march 2022) and will pass out. takes clonazepam    SURGICAL HISTORY: Past Surgical History:  Procedure Laterality Date   CT RADIATION THERAPY GUIDE     left groin   dental implant     permanent implant   KNEE SURGERY Left    arthroscopy   LEFT HEART CATH AND CORONARY ANGIOGRAPHY Left 06/29/2017   Procedure: LEFT HEART CATH AND CORONARY ANGIOGRAPHY;  Surgeon: [name], MD;  Location: [facility];  Service: Cardiovascular;  Laterality: Left;   LYMPH NODE DISSECTION Left 04/16/2019   Procedure: Left inguinal Lymph Node Dissection;  Surgeon: [name], MD;  Location: [facility];  Service: General;  Laterality: Left;   MELANOMA EXCISION Left 04/16/2019   Procedure: MELANOMA EXCISION LEFT GROIN MASS;  Surgeon: [name], MD;  Location: [facility];  Service: General;  Laterality: Left;   MELANOMA EXCISION WITH SENTINEL LYMPH NODE BIOPSY Left 2012   Left  calf    PACEMAKER INSERTION N/A 08/26/2018   Procedure: INSERTION PACEMAKER;  Surgeon: [name], MD;  Location: [facility];  Service: Cardiovascular;  Laterality: N/A;   PORTA CATH INSERTION N/A 08/26/2019   Procedure: PORTA CATH INSERTION;  Surgeon: Katha Cabal, MD;  Location: [facility];  Service: Cardiovascular;  Laterality: N/A;   SUPERFICIAL LYMPH NODE BIOPSY / EXCISION Left 2020   lymph nodes removed around left groin melanoma site   TEMPORARY PACEMAKER N/A 08/25/2018   Procedure: TEMPORARY PACEMAKER;  Surgeon: [name], MD;  Location: [facility];  Service: Cardiovascular;  Laterality: N/A;   TOTAL HIP ARTHROPLASTY Left 07/14/2020   Procedure: TOTAL HIP ARTHROPLASTY;  Surgeon: Dereck Leep, MD;  Location: [facility];  Service: Orthopedics;  Laterality: Left;    SOCIAL HISTORY: Social History   Socioeconomic History   Marital status: Married    Spouse name: Vicente Males    Number of children: 7   Years of education: 12   Highest education level: Not on file  Occupational History    Comment: disability  Tobacco  Use   Smoking status: Never   Smokeless tobacco: Never  Vaping Use   Vaping Use: Never used  Substance and Sexual Activity   Alcohol use: No   Drug use: No   Sexual activity: Not Currently  Other Topics Concern   Not on file  Social History Narrative   Lives with  Wife,   Has 2 small dogs   Caffeine use: sodas (2 per day)      Out of work on disability.  Has a walk in shower. No stairs to climb   Oncology treatment ongoing. Uses port a cath for treatment.      pacemaker   Social Determinants of Health   Financial Resource Strain: Not on file  Food Insecurity: Not on file  Transportation Needs: Not on file  Physical Activity: Not on file  Stress: Not on file  Social Connections: Not on file  Intimate Partner Violence: Not on file    FAMILY HISTORY: Family History  Problem Relation Age of Onset   Cancer Paternal  Grandmother     ALLERGIES:  is allergic to ibuprofen and nsaids.  MEDICATIONS:  Current Outpatient Medications  Medication Sig Dispense Refill   allopurinol (ZYLOPRIM) 300 MG tablet Take 300 mg by mouth daily.     ASPIRIN 81 PO Take 1 tablet by mouth daily.     atorvastatin (LIPITOR) 20 MG tablet Take 20 mg by mouth daily.     clonazePAM (KLONOPIN) 0.5 MG tablet 1 tablet in the morning, 2 in the evening 90 tablet 3   hydrocortisone 2.5 % ointment Apply topically 2 (two) times daily.     ketoconazole (NIZORAL) 2 % cream Apply 1 application topically daily.     lidocaine-prilocaine (EMLA) cream Apply 1 application topically as needed. Apply small amount of cream to port site approx 1-2 hours prior to appointment. 30 g 2   lisinopril (ZESTRIL) 20 MG tablet Take 20 mg by mouth daily.     metoprolol succinate (TOPROL-XL) 25 MG 24 hr tablet Take 25 mg by mouth daily.      Multiple Vitamin (MULTIVITAMIN WITH MINERALS) TABS tablet Take 1 tablet by mouth daily. Centrum Silver     nystatin (MYCOSTATIN/NYSTOP) powder Apply 1 application topically 3 (three) times daily. Apply small amount to affected area 3 times a day until healed 60 g 2   omeprazole (PRILOSEC) 20 MG capsule TAKE 1 CAPSULE BY MOUTH EVERY DAY 30 capsule 1   ondansetron (ZOFRAN) 4 MG tablet TAKE 1 TABLET BY MOUTH EVERY 8 HOURS AS NEEDED FOR NAUSEA AND VOMITING 90 tablet 1   oxyCODONE (OXY IR/ROXICODONE) 5 MG immediate release tablet Take 1 tablet (5 mg total) by mouth every 4 (four) hours as needed for moderate pain (pain score 4-6). (Patient not taking: Reported on 11/30/2020) 30 tablet 0   No current facility-administered medications for this visit.     PHYSICAL EXAMINATION: ECOG PERFORMANCE STATUS: 1 - Symptomatic but completely ambulatory Vitals:   04/01/21 0849  BP: (!) 154/77  Pulse: 77  Temp: (!) 96.9 F (36.1 C)   Filed Weights   04/01/21 0849  Weight: 246 lb (111.6 kg)    Physical Exam Constitutional:       General: He is not in acute distress.    Comments: Patient ambulates independently  HENT:     Head: Normocephalic and atraumatic.  Eyes:     General: No scleral icterus.    Pupils: Pupils are equal, round, and reactive to light.  Cardiovascular:  Rate and Rhythm: Normal rate and regular rhythm.     Heart sounds: Normal heart sounds.  Pulmonary:     Effort: Pulmonary effort is normal. No respiratory distress.     Breath sounds: No wheezing.  Abdominal:     General: Bowel sounds are normal. There is no distension.     Palpations: Abdomen is soft. There is no mass.     Tenderness: There is no abdominal tenderness.     Comments:    Musculoskeletal:        General: No deformity. Normal range of motion.     Cervical back: Normal range of motion and neck supple.     Comments: Left lower extremity edema Left hip replacement  Skin:    General: Skin is warm and dry.  Neurological:     Mental Status: He is alert and oriented to person, place, and time. Mental status is at baseline.     Cranial Nerves: No cranial nerve deficit.     Coordination: Coordination normal.  Psychiatric:        Mood and Affect: Mood normal.    LABORATORY DATA:  I have reviewed the data as listed Lab Results  Component Value Date   WBC 5.1 04/01/2021   HGB 11.1 (L) 04/01/2021   HCT 33.0 (L) 04/01/2021   MCV 90.7 04/01/2021   PLT 173 04/01/2021   Recent Labs    02/22/21 1300 03/08/21 1310 04/01/21 0831  NA 136 137 137  K 4.9 4.0 4.2  CL 105 104 106  CO2 $Re'23 25 24  'ozz$ GLUCOSE 96 93 137*  BUN 40* 24* 34*  CREATININE 1.39* 1.44* 1.39*  CALCIUM 9.5 8.7* 9.2  GFRNONAA 58* 55* 58*  PROT 7.1 6.8 7.2  ALBUMIN 4.3 3.9 4.0  AST $Re'27 31 26  'qgG$ ALT 32 43 31  ALKPHOS 90 89 97  BILITOT 0.5 0.4 0.5    Iron/TIBC/Ferritin/ %Sat    Component Value Date/Time   IRON 85 10/02/2019 0841   TIBC 342 10/02/2019 0841   FERRITIN 61 10/02/2019 0841   IRONPCTSAT 25 10/02/2019 0841      RADIOGRAPHIC STUDIES: I  have personally reviewed the radiological images as listed and agreed with the findings in the report. MR Brain Wo Contrast  Result Date: 03/10/2021 CLINICAL DATA:  Malignant melanoma of overlapping sites. Skin cancer, monitor; Melanoma. EXAM: MRI HEAD WITHOUT CONTRAST TECHNIQUE: Multiplanar, multiecho pulse sequences of the brain and surrounding structures were obtained without intravenous contrast. COMPARISON:  MRI of the brain March 10, 2019. FINDINGS: Brain: No acute infarction, hemorrhage, hydrocephalus, extra-axial collection or mass lesion. Minimal amount of punctate foci T2 hyperintensity within the white matter of the cerebral hemispheres nonspecific, may represent early chronic microangiopathy. Vascular: Normal flow voids. Skull and upper cervical spine: Normal marrow signal. Sinuses/Orbits: Mucosal thickening of the right maxillary sinus. The orbits are. Other: Right mastoid effusion. IMPRESSION: No definitive evidence of intracranial metastatic disease. However, the study is limited by absence of intravenous contrast. Small lesions could be missed. Electronically Signed   By: Pedro Earls M.D.   On: 03/10/2021 12:08   CT CHEST ABDOMEN PELVIS WO CONTRAST  Result Date: 01/20/2021 CLINICAL DATA:  Melanoma, metastases to the left groin, status post resection EXAM: CT CHEST, ABDOMEN AND PELVIS WITHOUT CONTRAST TECHNIQUE: Multidetector CT imaging of the chest, abdomen and pelvis was performed following the standard protocol without IV contrast. COMPARISON:  10/20/2020 FINDINGS: CT CHEST FINDINGS Cardiovascular: Right chest port catheter. Aortic atherosclerosis. Left chest multi  lead pacer. Normal heart size. Left and right coronary artery calcification. No pericardial effusion. Mediastinum/Nodes: No enlarged mediastinal, hilar, or axillary lymph nodes. Thyroid gland, trachea, and esophagus demonstrate no significant findings. Lungs/Pleura: Stable, benign 2 mm fissural nodule  superior segment left lower lobe (series 3, image 76). No pleural effusion or pneumothorax. Musculoskeletal: No chest wall mass or suspicious bone lesions identified. CT ABDOMEN PELVIS FINDINGS Hepatobiliary: No solid liver abnormality is seen. Hepatic steatosis. No gallstones, gallbladder wall thickening, or biliary dilatation. Pancreas: Unremarkable. No pancreatic ductal dilatation or surrounding inflammatory changes. Spleen: Normal in size without significant abnormality. Adrenals/Urinary Tract: Adrenal glands are unremarkable. Kidneys are normal, without renal calculi, solid lesion, or hydronephrosis. Bladder is unremarkable. Stomach/Bowel: Stomach is within normal limits. Appendix appears normal. No evidence of bowel wall thickening, distention, or inflammatory changes. Vascular/Lymphatic: Aortic atherosclerosis. No enlarged abdominal or pelvic lymph nodes. Redemonstrated postoperative findings of surgical resection in the left groin without noncontrast evidence of recurrent lymphadenopathy or soft tissue. Reproductive: No mass or other abnormality. Other: Fat containing umbilical hernia.  No abdominopelvic ascites. Musculoskeletal: No acute or significant osseous findings. Status post left hip total arthroplasty. IMPRESSION: 1. Redemonstrated postoperative findings of surgical resection in the left groin without noncontrast evidence of recurrent lymphadenopathy or soft tissue. 2. No noncontrast evidence of recurrent or metastatic disease in the chest, abdomen, or pelvis. 3. Hepatic steatosis. 4. Coronary artery disease. Aortic Atherosclerosis (ICD10-I70.0). Electronically Signed   By: Delanna Ahmadi M.D.   On: 01/20/2021 09:36        ASSESSMENT & PLAN:  1. Encounter for antineoplastic immunotherapy   2. Hyperglycemia   3. Spell of dizziness   4. Malignant melanoma of overlapping sites [facility])    #Left inguinal nodal recurrence of melanoma.  Status post resection and adjuvant radiation. Stage IV  Recurrent, now in NED Labs reviewed and discussed with patient. Proceed with nivolumab maintenance today. Plan to repeat CT scan after next visit.  Chronic kidney disease, creatinine continues to fluctuate.  Recommend to avoid nephrotoxins and stay well-hydrated. #Elevated blood glucose level, check HbA1c. #Vitiligo, stable #Normocytic anemia, hemoglobin is at 11.1 stable. # Spells, MRI brain findings were reviewed and discussed with patient..  Etiology unknown.  Questionable seizure.   I referred patient to get a second opinion with Dr. Mickeal Skinner   Follow-up 2 weeks at [facility] treatment. We spent sufficient time to discuss many aspect of care, questions were answered to patient's satisfaction.   [name], MD, PhD  04/01/2021

## 2021-04-10 ENCOUNTER — Other Ambulatory Visit: Payer: Self-pay | Admitting: Oncology

## 2021-04-15 ENCOUNTER — Other Ambulatory Visit: Payer: Self-pay

## 2021-04-15 ENCOUNTER — Inpatient Hospital Stay: Payer: Medicaid Other

## 2021-04-15 ENCOUNTER — Encounter: Payer: Self-pay | Admitting: Oncology

## 2021-04-15 ENCOUNTER — Inpatient Hospital Stay (HOSPITAL_BASED_OUTPATIENT_CLINIC_OR_DEPARTMENT_OTHER): Payer: Medicaid Other | Admitting: Internal Medicine

## 2021-04-15 ENCOUNTER — Inpatient Hospital Stay (HOSPITAL_BASED_OUTPATIENT_CLINIC_OR_DEPARTMENT_OTHER): Payer: Medicaid Other | Admitting: Oncology

## 2021-04-15 VITALS — BP 159/76 | HR 70 | Temp 78.6°F | Wt 242.0 lb

## 2021-04-15 VITALS — Resp 18

## 2021-04-15 DIAGNOSIS — D649 Anemia, unspecified: Secondary | ICD-10-CM

## 2021-04-15 DIAGNOSIS — N1831 Chronic kidney disease, stage 3a: Secondary | ICD-10-CM | POA: Diagnosis not present

## 2021-04-15 DIAGNOSIS — R4189 Other symptoms and signs involving cognitive functions and awareness: Secondary | ICD-10-CM | POA: Diagnosis not present

## 2021-04-15 DIAGNOSIS — Z5112 Encounter for antineoplastic immunotherapy: Secondary | ICD-10-CM | POA: Diagnosis not present

## 2021-04-15 DIAGNOSIS — C438 Malignant melanoma of overlapping sites of skin: Secondary | ICD-10-CM

## 2021-04-15 DIAGNOSIS — R739 Hyperglycemia, unspecified: Secondary | ICD-10-CM

## 2021-04-15 DIAGNOSIS — L80 Vitiligo: Secondary | ICD-10-CM

## 2021-04-15 DIAGNOSIS — R7989 Other specified abnormal findings of blood chemistry: Secondary | ICD-10-CM

## 2021-04-15 DIAGNOSIS — R42 Dizziness and giddiness: Secondary | ICD-10-CM | POA: Diagnosis not present

## 2021-04-15 HISTORY — DX: Other specified abnormal findings of blood chemistry: R79.89

## 2021-04-15 LAB — CBC WITH DIFFERENTIAL/PLATELET
Abs Immature Granulocytes: 0.11 10*3/uL — ABNORMAL HIGH (ref 0.00–0.07)
Basophils Absolute: 0 10*3/uL (ref 0.0–0.1)
Basophils Relative: 1 %
Eosinophils Absolute: 0.2 10*3/uL (ref 0.0–0.5)
Eosinophils Relative: 3 %
HCT: 35.9 % — ABNORMAL LOW (ref 39.0–52.0)
Hemoglobin: 11.9 g/dL — ABNORMAL LOW (ref 13.0–17.0)
Immature Granulocytes: 2 %
Lymphocytes Relative: 20 %
Lymphs Abs: 1.3 10*3/uL (ref 0.7–4.0)
MCH: 30.1 pg (ref 26.0–34.0)
MCHC: 33.1 g/dL (ref 30.0–36.0)
MCV: 90.9 fL (ref 80.0–100.0)
Monocytes Absolute: 0.5 10*3/uL (ref 0.1–1.0)
Monocytes Relative: 7 %
Neutro Abs: 4.4 10*3/uL (ref 1.7–7.7)
Neutrophils Relative %: 67 %
Platelets: 218 10*3/uL (ref 150–400)
RBC: 3.95 MIL/uL — ABNORMAL LOW (ref 4.22–5.81)
RDW: 12.8 % (ref 11.5–15.5)
WBC: 6.4 10*3/uL (ref 4.0–10.5)
nRBC: 0 % (ref 0.0–0.2)

## 2021-04-15 LAB — COMPREHENSIVE METABOLIC PANEL
ALT: 37 U/L (ref 0–44)
AST: 29 U/L (ref 15–41)
Albumin: 4.4 g/dL (ref 3.5–5.0)
Alkaline Phosphatase: 103 U/L (ref 38–126)
Anion gap: 9 (ref 5–15)
BUN: 27 mg/dL — ABNORMAL HIGH (ref 8–23)
CO2: 23 mmol/L (ref 22–32)
Calcium: 9.3 mg/dL (ref 8.9–10.3)
Chloride: 105 mmol/L (ref 98–111)
Creatinine, Ser: 1.35 mg/dL — ABNORMAL HIGH (ref 0.61–1.24)
GFR, Estimated: 60 mL/min — ABNORMAL LOW (ref >60)
Glucose, Bld: 149 mg/dL — ABNORMAL HIGH (ref 70–99)
Potassium: 4.2 mmol/L (ref 3.5–5.1)
Sodium: 137 mmol/L (ref 135–145)
Total Bilirubin: 0.5 mg/dL (ref 0.3–1.2)
Total Protein: 7.7 g/dL (ref 6.5–8.1)

## 2021-04-15 LAB — TSH: TSH: 2.875 u[IU]/mL (ref 0.350–4.500)

## 2021-04-15 LAB — T4, FREE: Free T4: 0.82 ng/dL (ref 0.61–1.12)

## 2021-04-15 MED ORDER — HEPARIN SOD (PORK) LOCK FLUSH 100 UNIT/ML IV SOLN
INTRAVENOUS | Status: AC
  Administered 2021-04-15: 500 [IU]
  Filled 2021-04-15: qty 5

## 2021-04-15 MED ORDER — HEPARIN SOD (PORK) LOCK FLUSH 100 UNIT/ML IV SOLN
500.0000 [IU] | Freq: Once | INTRAVENOUS | Status: AC | PRN
  Filled 2021-04-15: qty 5

## 2021-04-15 MED ORDER — SODIUM CHLORIDE 0.9 % IV SOLN
Freq: Once | INTRAVENOUS | Status: AC
  Filled 2021-04-15: qty 250

## 2021-04-15 MED ORDER — SODIUM CHLORIDE 0.9% FLUSH
10.0000 mL | INTRAVENOUS | Status: DC | PRN
  Administered 2021-04-15: 10 mL
  Filled 2021-04-15: qty 10

## 2021-04-15 MED ORDER — SODIUM CHLORIDE 0.9 % IV SOLN
240.0000 mg | Freq: Once | INTRAVENOUS | Status: AC
  Administered 2021-04-15: 240 mg via INTRAVENOUS
  Filled 2021-04-15: qty 24

## 2021-04-15 MED ORDER — LACOSAMIDE 100 MG PO TABS: 100.0000 mg | ORAL_TABLET | Freq: Two times a day (BID) | ORAL | 3 refills | Status: DC

## 2021-04-15 MED ORDER — SODIUM CHLORIDE 0.9% FLUSH
10.0000 mL | Freq: Once | INTRAVENOUS | Status: AC
  Administered 2021-04-15: 10 mL via INTRAVENOUS
  Filled 2021-04-15: qty 10

## 2021-04-15 NOTE — Patient Instructions (Signed)
[facility] [place]   Discharge Instructions: Thank you for choosing [facility] to provide your oncology and hematology care.  If you have a lab appointment with the [facility], please go directly to the [facility] and check in at the registration area.   Wear comfortable clothing and clothing appropriate for easy access to any Portacath or PICC line.   We strive to give you quality time with your provider. You may need to reschedule your appointment if you arrive late (15 or more minutes).  Arriving late affects you and other patients whose appointments are after yours.  Also, if you miss three or more appointments without notifying the office, you may be dismissed from the clinic at the providers discretion.      For prescription refill requests, have your pharmacy contact our office and allow 72 hours for refills to be completed.    Today you received the following chemotherapy and/or immunotherapy agents: Opdivo.      To help prevent nausea and vomiting after your treatment, we encourage you to take your nausea medication as directed.  BELOW ARE SYMPTOMS THAT SHOULD BE REPORTED IMMEDIATELY: *FEVER GREATER THAN 100.4 F (38 C) OR HIGHER *CHILLS OR SWEATING *NAUSEA AND VOMITING THAT IS NOT CONTROLLED WITH YOUR NAUSEA MEDICATION *UNUSUAL SHORTNESS OF BREATH *UNUSUAL BRUISING OR BLEEDING *URINARY PROBLEMS (pain or burning when urinating, or frequent urination) *BOWEL PROBLEMS (unusual diarrhea, constipation, pain near the anus) TENDERNESS IN MOUTH AND THROAT WITH OR WITHOUT PRESENCE OF ULCERS (sore throat, sores in mouth, or a toothache) UNUSUAL RASH, SWELLING OR PAIN  UNUSUAL VAGINAL DISCHARGE OR ITCHING   Items with * indicate a potential emergency and should be followed up as soon as possible or go to the Emergency Department if any problems should occur.  Please show the CHEMOTHERAPY ALERT CARD or IMMUNOTHERAPY ALERT CARD at check-in to  the Emergency Department and triage nurse.  Should you have questions after your visit or need to cancel or reschedule your appointment, please contact [facility]  Dept: [phone]  and follow the prompts.  Office hours are 8:00 a.m. to 4:30 p.m. Monday - Friday. Please note that voicemails left after 4:00 p.m. may not be returned until the following business day.  We are closed weekends and major holidays. You have access to a nurse at all times for urgent questions. Please call the main number to the clinic Dept: [phone] and follow the prompts.  For any non-urgent questions, you may also contact your provider using MyChart. We now offer e-Visits for anyone 42 and older to request care online for non-urgent symptoms. For details visit [email or web address].   Also download the MyChart app! Go to the app store, search "MyChart", open the app, select [facility], and log in with your MyChart username and password.  Due to Covid, a mask is required upon entering the hospital/clinic. If you do not have a mask, one will be given to you upon arrival. For doctor visits, patients may have 1 support person aged 65 or older with them. For treatment visits, patients cannot have anyone with them due to current Covid guidelines and our immunocompromised population.

## 2021-04-15 NOTE — Progress Notes (Signed)
[facility] at [place], [place] [address] [phone]   New Patient Evaluation  Date of Service: 04/15/21 Patient Name: Edward Pitts Patient MRN: [id number] Patient DOB: September 21, 1960 Provider: Ventura Sellers, MD  Identifying Statement:  Edward Pitts is a 61 y.o. male with suspected seizures who presents for initial consultation and evaluation regarding cancer associated neurologic deficits.    Referring Provider: [name], MD Edward Pitts,  [place] [address]  Primary Cancer:  Oncologic History: Oncology History  Malignant melanoma of overlapping sites [facility])  04/23/2019 Initial Diagnosis   Malignant melanoma of overlapping sites [facility])   07/23/2019 -  Chemotherapy   Patient is on Treatment Plan : MELANOMA ADJUVANT Nivolumab q14d     08/06/2019 Cancer Staging   Staging form: Melanoma of the Skin, AJCC 8th Edition - Pathologic: Stage Unknown (rpTX, pN1b, cM0) - Signed by Edward Server, MD on 07/27/2020 Stage prefix: Recurrence      History of Present Illness: The patient's records from the referring physician were obtained and reviewed and the patient interviewed to confirm this HPI.  Edward Pitts presents for follow up given neurologic complaints.  He has seen neurologist in [place] for several years, relevant history is obtained from review of prior records.  In short, he experiences paroxysmal episodes of "head in a vice, disconnected, in a daze, can't understand language".  Episodes started in 2019, last 1-2 minutes, and occur 2-3x per month.  He thinks frequency decreased when Clonazepam was started by Dr. Jannifer Pitts in 2019.  He had normal EEG at that time.  Recently was given a trial of Keppra, but he experienced significant dizziness and stopped it.  Had MRI brain without contrast done recently through Dr. Tasia Pitts.  Continues on nivolumab infusions for melanoma.   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  Medications: Current Outpatient Medications on File Prior to Visit  Medication Sig Dispense Refill   allopurinol (ZYLOPRIM) 300 MG tablet Take 300 mg by mouth daily.     ASPIRIN 81 PO Take 1 tablet by mouth daily.     atorvastatin (LIPITOR) 20 MG tablet Take 20 mg by mouth daily.     clonazePAM (KLONOPIN) 0.5 MG tablet 1 tablet in the morning, 2 in the evening 90 tablet 3   hydrocortisone 2.5 % ointment Apply topically 2 (two) times daily.     ketoconazole (NIZORAL) 2 % cream Apply 1 application topically daily.     lidocaine-prilocaine (EMLA) cream Apply 1 application topically as needed. Apply small amount of cream to port site approx 1-2 hours prior to appointment. 30 g 2   lisinopril (ZESTRIL) 20 MG tablet Take 20 mg by mouth daily.     metoprolol succinate (TOPROL-XL) 25 MG 24 hr tablet Take 25 mg by mouth daily.      Multiple Vitamin (MULTIVITAMIN WITH MINERALS) TABS tablet Take 1 tablet by mouth daily. Centrum Silver     nystatin (MYCOSTATIN/NYSTOP) powder Apply 1 application topically 3 (three) times daily. Apply small amount to affected area 3 times a day until healed 60 g 2   omeprazole (PRILOSEC) 20 MG capsule TAKE 1 CAPSULE BY MOUTH EVERY DAY 30 capsule 1   ondansetron (ZOFRAN) 4 MG tablet TAKE 1 TABLET BY MOUTH EVERY 8 HOURS AS NEEDED FOR NAUSEA AND VOMITING 90 tablet 1   oxyCODONE (OXY IR/ROXICODONE) 5 MG immediate release tablet Take  1 tablet (5 mg total) by mouth every 4 (four) hours as needed for moderate pain (pain score 4-6). (Patient not taking: Reported on 11/30/2020) 30 tablet 0   Current Facility-Administered Medications on File Prior to Visit  Medication Dose Route Frequency Provider Last Rate Last Admin   [COMPLETED] heparin lock flush 100 unit/mL  500 Units Intracatheter Once PRN Edward Server, MD   500 Units at 04/15/21 1040   sodium chloride flush (NS) 0.9 % injection 10 mL  10 mL  Intracatheter PRN Edward Server, MD   10 mL at 04/15/21 0934    Allergies:  Allergies  Allergen Reactions   Ibuprofen Other (See Comments)    Affects kidneys   Nsaids     Affects kidneys   Past Medical History:  Past Medical History:  Diagnosis Date   Anemia    iron treatments   Anxiety    Aortic atherosclerosis ([place])    Arthritis    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   Family history of adverse reaction to anesthesia    PONV mother   GERD (gastroesophageal reflux disease)    History of complete heart block    PPM placed   Hyperlipidemia    Hypertension    LBBB (left bundle branch block)    Lymphedema of left leg    uses thigh high compression stockings   Melanoma ([place]) 2012   skin cancer, left thigh   OSA on CPAP    PONV (postoperative nausea and vomiting) 04/16/2019   Port-A-Cath in place    RIGHT chest wall   Presence of cardiac pacemaker    [employer]   Seizures ([place])    still has episodes of dizziness. last event 1 month ago (march 2022) and will pass out. takes clonazepam   Past Surgical History:  Past Surgical History:  Procedure Laterality Date   CT RADIATION THERAPY GUIDE     left groin   dental implant     permanent implant   KNEE SURGERY Left    arthroscopy   LEFT HEART CATH AND CORONARY ANGIOGRAPHY Left 06/29/2017   Procedure: LEFT HEART CATH AND CORONARY ANGIOGRAPHY;  Surgeon: [name], MD;  Location: [facility];  Service: Cardiovascular;  Laterality: Left;   LYMPH NODE DISSECTION Left 04/16/2019   Procedure: Left inguinal Lymph Node Dissection;  Surgeon: [name], MD;  Location: [facility];  Service: General;  Laterality: Left;   MELANOMA EXCISION Left 04/16/2019   Procedure: MELANOMA EXCISION LEFT GROIN MASS;  Surgeon: [name], MD;  Location: [facility];   Service: General;  Laterality: Left;   MELANOMA EXCISION WITH SENTINEL LYMPH NODE BIOPSY Left 2012   Left calf    PACEMAKER INSERTION N/A 08/26/2018   Procedure: INSERTION PACEMAKER;  Surgeon: [name], MD;  Location: [facility];  Service: Cardiovascular;  Laterality: N/A;   PORTA CATH INSERTION N/A 08/26/2019   Procedure: PORTA CATH INSERTION;  Surgeon: Katha Cabal, MD;  Location: [facility];  Service: Cardiovascular;  Laterality: N/A;   SUPERFICIAL LYMPH NODE BIOPSY / EXCISION Left 2020   lymph nodes removed around left groin melanoma site   TEMPORARY PACEMAKER N/A 08/25/2018   Procedure: TEMPORARY PACEMAKER;  Surgeon: [name], MD;  Location: [facility];  Service: Cardiovascular;  Laterality: N/A;   TOTAL HIP ARTHROPLASTY Left 07/14/2020   Procedure: TOTAL HIP ARTHROPLASTY;  Surgeon: Dereck Leep, MD;  Location: [facility];  Service: Orthopedics;  Laterality: Left;   Social History:  Social History   Socioeconomic History   Marital status: Married    Spouse name: Vicente Males    Number of children: 7   Years of education: 12   Highest education level: Not on file  Occupational History    Comment: disability  Tobacco Use   Smoking status: Never   Smokeless tobacco: Never  Vaping Use   Vaping Use: Never used  Substance and Sexual Activity   Alcohol use: No   Drug use: No   Sexual activity: Not Currently  Other Topics Concern   Not on file  Social History Narrative   Lives with  Wife,   Has 2 small dogs   Caffeine use: sodas (2 per day)      Out of work on disability.  Has a walk in shower. No stairs to climb   Oncology treatment ongoing. Uses port a cath for treatment.      pacemaker   Social Determinants of Health   Financial Resource Strain: Not on file  Food Insecurity: Not on file  Transportation Needs: Not on file  Physical Activity: Not on file  Stress: Not on file  Social Connections: Not on file  Intimate Partner Violence:  Not on file   Family History:  Family History  Problem Relation Age of Onset   Cancer Paternal Grandmother     Review of Systems: Constitutional: Doesn't report fevers, chills or abnormal weight loss Eyes: Doesn't report blurriness of vision Ears, nose, mouth, throat, and face: Doesn't report sore throat Respiratory: Doesn't report cough, dyspnea or wheezes Cardiovascular: Doesn't report palpitation, chest discomfort  Gastrointestinal:  Doesn't report nausea, constipation, diarrhea GU: Doesn't report incontinence Skin: Doesn't report skin rashes Neurological: Per HPI Musculoskeletal: Doesn't report joint pain Behavioral/Psych: Doesn't report anxiety  Physical Exam: Wt Readings from Last 3 Encounters:  04/15/21 242 lb (109.8 kg)  04/01/21 246 lb (111.6 kg)  03/08/21 244 lb 11.2 oz (111 kg)   Temp Readings from Last 3 Encounters:  04/15/21 (!) 78.6 F (25.9 C) (Tympanic)  04/01/21 (!) 96.9 F (36.1 C) (Tympanic)  03/08/21 97.6 F (36.4 C)   BP Readings from Last 3 Encounters:  04/15/21 (!) 159/76  04/01/21 (!) 154/77  03/08/21 (!) 145/77   Pulse Readings from Last 3 Encounters:  04/15/21 70  04/01/21 77  03/08/21 80   KPS: 90. General: Alert, cooperative, pleasant, in no acute distress Head: Normal EENT: No conjunctival injection or scleral icterus.  Lungs: Resp effort normal Cardiac: Regular rate Abdomen: Non-distended abdomen Skin: No rashes cyanosis or petechiae. Extremities: No clubbing or edema  Neurologic Exam: Mental Status: Awake, alert, attentive to examiner. Oriented to self and environment. Language is fluent with intact comprehension.  Cranial Nerves: Visual acuity is grossly normal. Visual fields are full. Extra-ocular movements intact. No ptosis. Face is symmetric Motor: Tone and bulk are normal. Power is full in both arms and legs. Reflexes are symmetric, no pathologic reflexes present.  Sensory: Intact to light touch Gait:  Normal.   Labs: I have reviewed the data as listed    Component Value Date/Time   NA 137 04/15/2021 0842   K 4.2 04/15/2021 0842   CL 105 04/15/2021 0842   CO2 23 04/15/2021 0842   GLUCOSE 149 (H) 04/15/2021 7681  BUN 27 (H) 04/15/2021 0842   CREATININE 1.35 (H) 04/15/2021 0842   CALCIUM 9.3 04/15/2021 0842   PROT 7.7 04/15/2021 0842   ALBUMIN 4.4 04/15/2021 0842   AST 29 04/15/2021 0842   ALT 37 04/15/2021 0842   ALKPHOS 103 04/15/2021 0842   BILITOT 0.5 04/15/2021 0842   GFRNONAA 60 (L) 04/15/2021 0842   GFRAA >60 12/25/2019 0905   Lab Results  Component Value Date   WBC 6.4 04/15/2021   NEUTROABS 4.4 04/15/2021   HGB 11.9 (L) 04/15/2021   HCT 35.9 (L) 04/15/2021   MCV 90.9 04/15/2021   PLT 218 04/15/2021    Imaging: CLINICAL DATA:  Malignant melanoma of overlapping sites. Skin cancer, monitor; Melanoma.   EXAM: MRI HEAD WITHOUT CONTRAST   TECHNIQUE: Multiplanar, multiecho pulse sequences of the brain and surrounding structures were obtained without intravenous contrast.   COMPARISON:  MRI of the brain March 10, 2019.   FINDINGS: Brain: No acute infarction, hemorrhage, hydrocephalus, extra-axial collection or mass lesion. Minimal amount of punctate foci T2 hyperintensity within the white matter of the cerebral hemispheres nonspecific, may represent early chronic microangiopathy.   Vascular: Normal flow voids.   Skull and upper cervical spine: Normal marrow signal.   Sinuses/Orbits: Mucosal thickening of the right maxillary sinus. The orbits are.   Other: Right mastoid effusion.   IMPRESSION: No definitive evidence of intracranial metastatic disease. However, the study is limited by absence of intravenous contrast. Small lesions could be missed.     Electronically Signed   By: Pedro Earls M.D.   On: 03/10/2021 12:08  Assessment/Plan Episodes of altered cognition  Mahlik Lenn presents with paroxysmal impairments in  awareness which are stereotypical.  Etiology is unclear, epilepsy should be considered.  Brain MRI was reviewed (normal), no need to repeat with contrast.  Prior EEG was reviewed.  Recommended second trial of AED; in this case Vimpat given concurrent melanoma, systemic therapy, renal impairment, poor tolerance of Keppra.  We spent twenty additional minutes teaching regarding the natural history, biology, and historical experience in the treatment of neurologic complications of cancer.   We appreciate the opportunity to participate in the care of Corderius Pabst.  He will maintain an event journal and follow up with [place] in 2 months for further assessment.  All questions were answered. The patient knows to call the clinic with any problems, questions or concerns. No barriers to learning were detected.  The total time spent in the encounter was 40 minutes and more than 50% was on counseling and review of test results   Edward Sellers, MD Medical Director of Neuro-Oncology [facility] 04/15/21 10:38 AM

## 2021-04-15 NOTE — Progress Notes (Signed)
Hematology/Oncology follow up  note [phone] [phone]   Patient Care Team: Mechele Claude, FNP as PCP - General (Family Medicine) [name], MD as Consulting Physician (Oncology) Mickeal Skinner Acey Lav, MD as Consulting Physician (Oncology) [name], MD as Consulting Physician (Cardiology)  REFERRING PROVIDER: Mechele Claude, FNP  CHIEF COMPLAINTS/REASON FOR VISIT:  Follow up for melanoma HISTORY OF PRESENTING ILLNESS:   Edward Pitts is a  61 y.o.  male with PMH listed below was seen in consultation at the request of  Mechele Claude, FNP  for evaluation of inguinal mass Patient presented to emergency room 3 days ago complaining about left ing uinal mass discomfort. Reports that he has really noticed the mass growing for the past 1 months. He has a history of left lower extremity melanoma in 2011, status post local excision.  Pain was increased with squatting of laxation. He was advised to take Tylenol for pain. Denies any fever, chills, night sweating.  He does feel mild nauseated. Appetite is fair.  He has lost about 10 pounds since earlier this year. 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  Left inguinal and left iliac nodes which are suspicious for involvement.  There are also 2 small nonspecific hypodense lesions within the right liver, nonspecific.  # patient underwent left groin mass resection on 04/16/2019. Resection pathology showed malignant melanoma, replacing a lymph node, with extracapsular extension, peripheral and deep margins involved.  Left inguinal contents, all 7 lymph nodes were negative for melanoma in the lymph nodes.  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  # PDL1 80% TPS  # 04/16/2019. underwent left groin mass resection   07/07/2019  Status post adjuvant radiation and finished  radiation  Patient has Mediport placed to facilitate immunotherapy treatments.  06/29/2020, CT chest abdomen pelvis showed stable postoperative appearance of the left groin.  No evidence of local recurrence.  No evidence of metastatic disease in the chest abdomen or pelvis.  Hepatic steatosis.  Stable subcentimeter fluid attenuation lesion of the lateral right lobe of the liver, likely benign cyst or hemangioma.  Coronary artery disease.  Aortic atherosclerosis  10/20/2020, CT chest abdomen pelvis showed stable postoperative/radiation appearance of the left groin.  No evidence of local recurrence/metastatic disease within the chest abdomen/pelvis.  Fatty liver disease.  Diverticulosis without evidence of typhlitis.  Aortic atherosclerosis  03/10/2021 MRI brain without contrast showed no definitive evidence of intracranial metastatic disease.  Study is limited by absence of intravenous contrast.  INTERVAL HISTORY Edward Pitts is a 61 y.o. male who has above history reviewed by me today presents for follow up visit for management of inguinal nodal recurrence of melanoma Patient has been off Keppra due to not able to tolerate. he continues to have intermittent spells. He has no new complaints today no nausea vomiting diarrhea, fever or chills. . :Review of Systems  Constitutional:  Negative for appetite change, chills, fatigue, fever and unexpected weight change.  HENT:   Negative for hearing loss and voice change.   Eyes:  Negative for eye problems and icterus.  Respiratory:  Negative for chest tightness, cough and shortness of breath.   Cardiovascular:  Negative for chest pain and leg swelling.  Gastrointestinal:  Negative for abdominal distention and abdominal pain.  Endocrine: Negative for hot flashes.  Genitourinary:  Negative for difficulty urinating, dysuria and frequency.   Musculoskeletal:  Status post left hip replacement  Skin:  Negative for itching and rash.       Skin  hypo-pigmentation on upper extremities, no change  Neurological:  Negative for light-headedness and numbness.       Spells  Hematological:  Negative for adenopathy. Does not bruise/bleed easily.  Psychiatric/Behavioral:  Negative for confusion.    MEDICAL HISTORY:  Past Medical History:  Diagnosis Date   Anemia    iron treatments   Anxiety    Aortic atherosclerosis (HCC)    Arthritis    Cancer of groin ([place]) 2021   left groin, resected, radiation   Cataract    Complication of anesthesia    PONV   Coronary artery disease    Dizziness of unknown etiology    has led to seizures and passing out.   Family history of adverse reaction to anesthesia    PONV mother   GERD (gastroesophageal reflux disease)    History of complete heart block    PPM placed   Hyperlipidemia    Hypertension    LBBB (left bundle branch block)    Lymphedema of left leg    uses thigh high compression stockings   Melanoma ([place]) 2012   skin cancer, left thigh   OSA on CPAP    PONV (postoperative nausea and vomiting) 04/16/2019   Port-A-Cath in place    RIGHT chest wall   Presence of cardiac pacemaker    [employer]   Seizures ([place])    still has episodes of dizziness. last event 1 month ago (march 2022) and will pass out. takes clonazepam    SURGICAL HISTORY: Past Surgical History:  Procedure Laterality Date   CT RADIATION THERAPY GUIDE     left groin   dental implant     permanent implant   KNEE SURGERY Left    arthroscopy   LEFT HEART CATH AND CORONARY ANGIOGRAPHY Left 06/29/2017   Procedure: LEFT HEART CATH AND CORONARY ANGIOGRAPHY;  Surgeon: [name], MD;  Location: [facility];  Service: Cardiovascular;  Laterality: Left;   LYMPH NODE DISSECTION Left 04/16/2019   Procedure: Left inguinal Lymph Node Dissection;  Surgeon: [name], MD;  Location: [facility];  Service: General;  Laterality: Left;   MELANOMA EXCISION Left 04/16/2019   Procedure: MELANOMA EXCISION LEFT GROIN MASS;   Surgeon: [name], MD;  Location: [facility];  Service: General;  Laterality: Left;   MELANOMA EXCISION WITH SENTINEL LYMPH NODE BIOPSY Left 2012   Left calf    PACEMAKER INSERTION N/A 08/26/2018   Procedure: INSERTION PACEMAKER;  Surgeon: [name], MD;  Location: [facility];  Service: Cardiovascular;  Laterality: N/A;   PORTA CATH INSERTION N/A 08/26/2019   Procedure: PORTA CATH INSERTION;  Surgeon: Katha Cabal, MD;  Location: [facility];  Service: Cardiovascular;  Laterality: N/A;   SUPERFICIAL LYMPH NODE BIOPSY / EXCISION Left 2020   lymph nodes removed around left groin melanoma site   TEMPORARY PACEMAKER N/A 08/25/2018   Procedure: TEMPORARY PACEMAKER;  Surgeon: [name], MD;  Location: [facility];  Service: Cardiovascular;  Laterality: N/A;   TOTAL HIP ARTHROPLASTY Left 07/14/2020   Procedure: TOTAL HIP ARTHROPLASTY;  Surgeon: Dereck Leep, MD;  Location: [facility];  Service: Orthopedics;  Laterality: Left;    SOCIAL HISTORY: Social History   Socioeconomic History   Marital status: Married    Spouse name: Vicente Males    Number of children: 7   Years of education: 16  Highest education level: Not on file  Occupational History    Comment: disability  Tobacco Use   Smoking status: Never   Smokeless tobacco: Never  Vaping Use   Vaping Use: Never used  Substance and Sexual Activity   Alcohol use: No   Drug use: No   Sexual activity: Not Currently  Other Topics Concern   Not on file  Social History Narrative   Lives with  Wife,   Has 2 small dogs   Caffeine use: sodas (2 per day)      Out of work on disability.  Has a walk in shower. No stairs to climb   Oncology treatment ongoing. Uses port a cath for treatment.      pacemaker   Social Determinants of Health   Financial Resource Strain: Not on file  Food Insecurity: Not on file  Transportation Needs: Not on file  Physical Activity: Not on file  Stress: Not on file  Social  Connections: Not on file  Intimate Partner Violence: Not on file    FAMILY HISTORY: Family History  Problem Relation Age of Onset   Cancer Paternal Grandmother     ALLERGIES:  is allergic to ibuprofen and nsaids.  MEDICATIONS:  Current Outpatient Medications  Medication Sig Dispense Refill   allopurinol (ZYLOPRIM) 300 MG tablet Take 300 mg by mouth daily.     ASPIRIN 81 PO Take 1 tablet by mouth daily.     atorvastatin (LIPITOR) 20 MG tablet Take 20 mg by mouth daily.     clonazePAM (KLONOPIN) 0.5 MG tablet 1 tablet in the morning, 2 in the evening 90 tablet 3   hydrocortisone 2.5 % ointment Apply topically 2 (two) times daily.     ketoconazole (NIZORAL) 2 % cream Apply 1 application topically daily.     lidocaine-prilocaine (EMLA) cream Apply 1 application topically as needed. Apply small amount of cream to port site approx 1-2 hours prior to appointment. 30 g 2   lisinopril (ZESTRIL) 20 MG tablet Take 20 mg by mouth daily.     metoprolol succinate (TOPROL-XL) 25 MG 24 hr tablet Take 25 mg by mouth daily.      Multiple Vitamin (MULTIVITAMIN WITH MINERALS) TABS tablet Take 1 tablet by mouth daily. Centrum Silver     nystatin (MYCOSTATIN/NYSTOP) powder Apply 1 application topically 3 (three) times daily. Apply small amount to affected area 3 times a day until healed 60 g 2   omeprazole (PRILOSEC) 20 MG capsule TAKE 1 CAPSULE BY MOUTH EVERY DAY 30 capsule 1   ondansetron (ZOFRAN) 4 MG tablet TAKE 1 TABLET BY MOUTH EVERY 8 HOURS AS NEEDED FOR NAUSEA AND VOMITING 90 tablet 1   oxyCODONE (OXY IR/ROXICODONE) 5 MG immediate release tablet Take 1 tablet (5 mg total) by mouth every 4 (four) hours as needed for moderate pain (pain score 4-6). (Patient not taking: Reported on 11/30/2020) 30 tablet 0   No current facility-administered medications for this visit.   Facility-Administered Medications Ordered in Other Visits  Medication Dose Route Frequency Provider Last Rate Last Admin   heparin  lock flush 100 unit/mL  500 Units Intracatheter Once PRN [name], MD       nivolumab (OPDIVO) 240 mg in sodium chloride 0.9 % 100 mL chemo infusion  240 mg Intravenous Once [name], MD         PHYSICAL EXAMINATION: ECOG PERFORMANCE STATUS: 1 - Symptomatic but completely ambulatory Vitals:   04/15/21 0855  BP: (!) 159/76  Pulse: 70  Temp: (!) 78.6 F (25.9 C)   Filed Weights   04/15/21 0855  Weight: 242 lb (109.8 kg)    Physical Exam Constitutional:      General: He is not in acute distress.    Comments: Patient ambulates independently  HENT:     Head: Normocephalic and atraumatic.  Eyes:     General: No scleral icterus.    Pupils: Pupils are equal, round, and reactive to light.  Cardiovascular:     Rate and Rhythm: Normal rate and regular rhythm.     Heart sounds: Normal heart sounds.  Pulmonary:     Effort: Pulmonary effort is normal. No respiratory distress.     Breath sounds: No wheezing.  Abdominal:     General: Bowel sounds are normal. There is no distension.     Palpations: Abdomen is soft. There is no mass.     Tenderness: There is no abdominal tenderness.     Comments:    Musculoskeletal:        General: No deformity. Normal range of motion.     Cervical back: Normal range of motion and neck supple.     Comments: Left lower extremity edema Left hip replacement  Skin:    General: Skin is warm and dry.  Neurological:     Mental Status: He is alert and oriented to person, place, and time. Mental status is at baseline.     Cranial Nerves: No cranial nerve deficit.     Coordination: Coordination normal.  Psychiatric:        Mood and Affect: Mood normal.    LABORATORY DATA:  I have reviewed the data as listed Lab Results  Component Value Date   WBC 6.4 04/15/2021   HGB 11.9 (L) 04/15/2021   HCT 35.9 (L) 04/15/2021   MCV 90.9 04/15/2021   PLT 218 04/15/2021   Recent Labs    03/08/21 1310 04/01/21 0831 04/15/21 0842  NA 137 137 137  K 4.0 4.2  4.2  CL 104 106 105  CO2 _0 GLUCOSE 93 137* 149*  BUN 24* 34* 27*  CREATININE 1.44* 1.39* 1.35*  CALCIUM 8.7* 9.2 9.3  GFRNONAA 55* 58* 60*  PROT 6.8 7.2 7.7  ALBUMIN 3.9 4.0 4.4  AST _1 ALT 43 31 37  ALKPHOS 89 97 103  BILITOT 0.4 0.5 0.5    Iron/TIBC/Ferritin/ %Sat    Component Value Date/Time   IRON 85 10/02/2019 0841   TIBC 342 10/02/2019 0841   FERRITIN 61 10/02/2019 0841   IRONPCTSAT 25 10/02/2019 0841      RADIOGRAPHIC STUDIES: I have personally reviewed the radiological images as listed and agreed with the findings in the report. MR Brain Wo Contrast  Result Date: 03/10/2021 CLINICAL DATA:  Malignant melanoma of overlapping sites. Skin cancer, monitor; Melanoma. EXAM: MRI HEAD WITHOUT CONTRAST TECHNIQUE: Multiplanar, multiecho pulse sequences of the brain and surrounding structures were obtained without intravenous contrast. COMPARISON:  MRI of the brain March 10, 2019. FINDINGS: Brain: No acute infarction, hemorrhage, hydrocephalus, extra-axial collection or mass lesion. Minimal amount of punctate foci T2 hyperintensity within the white matter of the cerebral hemispheres nonspecific, may represent early chronic microangiopathy. Vascular: Normal flow voids. Skull and upper cervical spine: Normal marrow signal. Sinuses/Orbits: Mucosal thickening of the right maxillary sinus. The orbits are. Other: Right mastoid effusion. IMPRESSION: No definitive evidence of intracranial metastatic disease. However, the study is limited by absence of intravenous contrast. Small lesions could be missed. Electronically  Signed   By: Pedro Earls M.D.   On: 03/10/2021 12:08   CT CHEST ABDOMEN PELVIS WO CONTRAST  Result Date: 01/20/2021 CLINICAL DATA:  Melanoma, metastases to the left groin, status post resection EXAM: CT CHEST, ABDOMEN AND PELVIS WITHOUT CONTRAST TECHNIQUE: Multidetector CT imaging of the chest, abdomen and pelvis was performed following the  standard protocol without IV contrast. COMPARISON:  10/20/2020 FINDINGS: CT CHEST FINDINGS Cardiovascular: Right chest port catheter. Aortic atherosclerosis. Left chest multi lead pacer. Normal heart size. Left and right coronary artery calcification. No pericardial effusion. Mediastinum/Nodes: No enlarged mediastinal, hilar, or axillary lymph nodes. Thyroid gland, trachea, and esophagus demonstrate no significant findings. Lungs/Pleura: Stable, benign 2 mm fissural nodule superior segment left lower lobe (series 3, image 76). No pleural effusion or pneumothorax. Musculoskeletal: No chest wall mass or suspicious bone lesions identified. CT ABDOMEN PELVIS FINDINGS Hepatobiliary: No solid liver abnormality is seen. Hepatic steatosis. No gallstones, gallbladder wall thickening, or biliary dilatation. Pancreas: Unremarkable. No pancreatic ductal dilatation or surrounding inflammatory changes. Spleen: Normal in size without significant abnormality. Adrenals/Urinary Tract: Adrenal glands are unremarkable. Kidneys are normal, without renal calculi, solid lesion, or hydronephrosis. Bladder is unremarkable. Stomach/Bowel: Stomach is within normal limits. Appendix appears normal. No evidence of bowel wall thickening, distention, or inflammatory changes. Vascular/Lymphatic: Aortic atherosclerosis. No enlarged abdominal or pelvic lymph nodes. Redemonstrated postoperative findings of surgical resection in the left groin without noncontrast evidence of recurrent lymphadenopathy or soft tissue. Reproductive: No mass or other abnormality. Other: Fat containing umbilical hernia.  No abdominopelvic ascites. Musculoskeletal: No acute or significant osseous findings. Status post left hip total arthroplasty. IMPRESSION: 1. Redemonstrated postoperative findings of surgical resection in the left groin without noncontrast evidence of recurrent lymphadenopathy or soft tissue. 2. No noncontrast evidence of recurrent or metastatic disease in  the chest, abdomen, or pelvis. 3. Hepatic steatosis. 4. Coronary artery disease. Aortic Atherosclerosis (ICD10-I70.0). Electronically Signed   By: Delanna Ahmadi M.D.   On: 01/20/2021 09:36        ASSESSMENT & PLAN:  1. Malignant melanoma of overlapping sites ([place])   2. Spell of dizziness   3. Encounter for antineoplastic immunotherapy   4. Stage 3a chronic kidney disease (HCC)    #Left inguinal nodal recurrence of melanoma.  Status post resection and adjuvant radiation. Stage IV Recurrent, now in NED Labs reviewed and discussed with patient. Proceed with nivolumab treatment today. repeat CT chest abdomen pelvis scan for surveillance  Chronic kidney disease, avoid nephrotoxins.  Encourage oral hydration.   #Elevated blood glucose level, check HbA1c. #Vitiligo, stable #Normocytic anemia, hemoglobin is at 11.1 stable. # Spells, MRI brain findings were reviewed and discussed with patient..  Etiology unknown.  Questionable seizure.   I referred patient to get a second opinion with Dr. Mickeal Skinner   Follow-up 2 weeks at [facility] treatment. We spent sufficient time to discuss many aspect of care, questions were answered to patient's satisfaction.   [name], MD, PhD  04/15/2021

## 2021-04-17 ENCOUNTER — Other Ambulatory Visit: Payer: Self-pay | Admitting: Oncology

## 2021-04-19 ENCOUNTER — Encounter: Payer: Self-pay | Admitting: Oncology

## 2021-04-22 ENCOUNTER — Inpatient Hospital Stay: Payer: Medicaid Other | Admitting: Internal Medicine

## 2021-04-26 ENCOUNTER — Other Ambulatory Visit: Payer: Self-pay

## 2021-04-26 ENCOUNTER — Ambulatory Visit

## 2021-04-26 ENCOUNTER — Ambulatory Visit
Admission: RE | Admit: 2021-04-26 | Discharge: 2021-04-26 | Disposition: A | Discharge status: Home / Self Care | Payer: Medicaid Other | Source: Ambulatory Visit | Attending: Oncology | Admitting: Oncology

## 2021-04-26 DIAGNOSIS — C438 Malignant melanoma of overlapping sites of skin: Secondary | ICD-10-CM | POA: Insufficient documentation

## 2021-04-27 ENCOUNTER — Encounter: Payer: Self-pay | Admitting: Oncology

## 2021-04-28 ENCOUNTER — Encounter: Payer: Self-pay | Admitting: Oncology

## 2021-04-29 ENCOUNTER — Inpatient Hospital Stay: Payer: Medicaid Other

## 2021-04-29 ENCOUNTER — Inpatient Hospital Stay (HOSPITAL_BASED_OUTPATIENT_CLINIC_OR_DEPARTMENT_OTHER): Payer: Medicaid Other | Admitting: Oncology

## 2021-04-29 ENCOUNTER — Encounter: Payer: Self-pay | Admitting: Oncology

## 2021-04-29 ENCOUNTER — Inpatient Hospital Stay: Payer: Medicaid Other | Attending: Oncology

## 2021-04-29 ENCOUNTER — Other Ambulatory Visit: Payer: Self-pay

## 2021-04-29 VITALS — BP 145/74 | HR 81 | Temp 96.6°F | Wt 242.0 lb

## 2021-04-29 DIAGNOSIS — Z5112 Encounter for antineoplastic immunotherapy: Secondary | ICD-10-CM | POA: Insufficient documentation

## 2021-04-29 DIAGNOSIS — R4189 Other symptoms and signs involving cognitive functions and awareness: Secondary | ICD-10-CM | POA: Diagnosis not present

## 2021-04-29 DIAGNOSIS — N1831 Chronic kidney disease, stage 3a: Secondary | ICD-10-CM | POA: Insufficient documentation

## 2021-04-29 DIAGNOSIS — C438 Malignant melanoma of overlapping sites of skin: Secondary | ICD-10-CM

## 2021-04-29 DIAGNOSIS — C4359 Malignant melanoma of other part of trunk: Secondary | ICD-10-CM | POA: Insufficient documentation

## 2021-04-29 DIAGNOSIS — E1122 Type 2 diabetes mellitus with diabetic chronic kidney disease: Secondary | ICD-10-CM | POA: Diagnosis not present

## 2021-04-29 DIAGNOSIS — D649 Anemia, unspecified: Secondary | ICD-10-CM | POA: Insufficient documentation

## 2021-04-29 DIAGNOSIS — E1136 Type 2 diabetes mellitus with diabetic cataract: Secondary | ICD-10-CM | POA: Insufficient documentation

## 2021-04-29 DIAGNOSIS — I129 Hypertensive chronic kidney disease with stage 1 through stage 4 chronic kidney disease, or unspecified chronic kidney disease: Secondary | ICD-10-CM | POA: Insufficient documentation

## 2021-04-29 DIAGNOSIS — L80 Vitiligo: Secondary | ICD-10-CM | POA: Diagnosis not present

## 2021-04-29 LAB — CBC WITH DIFFERENTIAL/PLATELET
Abs Immature Granulocytes: 0.04 10*3/uL (ref 0.00–0.07)
Basophils Absolute: 0 10*3/uL (ref 0.0–0.1)
Basophils Relative: 1 %
Eosinophils Absolute: 0.2 10*3/uL (ref 0.0–0.5)
Eosinophils Relative: 5 %
HCT: 35.3 % — ABNORMAL LOW (ref 39.0–52.0)
Hemoglobin: 11.9 g/dL — ABNORMAL LOW (ref 13.0–17.0)
Immature Granulocytes: 1 %
Lymphocytes Relative: 28 %
Lymphs Abs: 1.2 10*3/uL (ref 0.7–4.0)
MCH: 30.1 pg (ref 26.0–34.0)
MCHC: 33.7 g/dL (ref 30.0–36.0)
MCV: 89.1 fL (ref 80.0–100.0)
Monocytes Absolute: 0.4 10*3/uL (ref 0.1–1.0)
Monocytes Relative: 8 %
Neutro Abs: 2.5 10*3/uL (ref 1.7–7.7)
Neutrophils Relative %: 57 %
Platelets: 194 10*3/uL (ref 150–400)
RBC: 3.96 MIL/uL — ABNORMAL LOW (ref 4.22–5.81)
RDW: 13 % (ref 11.5–15.5)
WBC: 4.4 10*3/uL (ref 4.0–10.5)
nRBC: 0 % (ref 0.0–0.2)

## 2021-04-29 LAB — COMPREHENSIVE METABOLIC PANEL
ALT: 37 U/L (ref 0–44)
AST: 30 U/L (ref 15–41)
Albumin: 4.2 g/dL (ref 3.5–5.0)
Alkaline Phosphatase: 100 U/L (ref 38–126)
Anion gap: 6 (ref 5–15)
BUN: 22 mg/dL (ref 8–23)
CO2: 24 mmol/L (ref 22–32)
Calcium: 9.1 mg/dL (ref 8.9–10.3)
Chloride: 106 mmol/L (ref 98–111)
Creatinine, Ser: 1.17 mg/dL (ref 0.61–1.24)
GFR, Estimated: >60 mL/min (ref >60)
Glucose, Bld: 160 mg/dL — ABNORMAL HIGH (ref 70–99)
Potassium: 3.7 mmol/L (ref 3.5–5.1)
Sodium: 136 mmol/L (ref 135–145)
Total Bilirubin: 0.2 mg/dL — ABNORMAL LOW (ref 0.3–1.2)
Total Protein: 7.5 g/dL (ref 6.5–8.1)

## 2021-04-29 MED ORDER — SODIUM CHLORIDE 0.9 % IV SOLN
Freq: Once | INTRAVENOUS | Status: AC
  Filled 2021-04-29: qty 250

## 2021-04-29 MED ORDER — HEPARIN SOD (PORK) LOCK FLUSH 100 UNIT/ML IV SOLN
INTRAVENOUS | Status: AC
  Filled 2021-04-29: qty 5

## 2021-04-29 MED ORDER — SODIUM CHLORIDE 0.9 % IV SOLN
240.0000 mg | Freq: Once | INTRAVENOUS | Status: AC
  Administered 2021-04-29: 240 mg via INTRAVENOUS
  Filled 2021-04-29: qty 24

## 2021-04-29 MED ORDER — HEPARIN SOD (PORK) LOCK FLUSH 100 UNIT/ML IV SOLN
500.0000 [IU] | Freq: Once | INTRAVENOUS | Status: AC | PRN
  Administered 2021-04-29: 500 [IU]
  Filled 2021-04-29: qty 5

## 2021-04-29 NOTE — Patient Instructions (Signed)
[facility]  Discharge Instructions: °Thank you for choosing [facility] to provide your oncology and hematology care.  °If you have a lab appointment with the Cancer Center, please go directly to the Cancer Center and check in at the registration area. ° °Wear comfortable clothing and clothing appropriate for easy access to any Portacath or PICC line.  ° °We strive to give you quality time with your provider. You may need to reschedule your appointment if you arrive late (15 or more minutes).  Arriving late affects you and other patients whose appointments are after yours.  Also, if you miss three or more appointments without notifying the office, you may be dismissed from the clinic at the provider’s discretion.    °  °For prescription refill requests, have your pharmacy contact our office and allow 72 hours for refills to be completed.   ° °Today you received the following chemotherapy and/or immunotherapy agents Nivolumab     °  °To help prevent nausea and vomiting after your treatment, we encourage you to take your nausea medication as directed. ° °BELOW ARE SYMPTOMS THAT SHOULD BE REPORTED IMMEDIATELY: °*FEVER GREATER THAN 100.4 F (38 °C) OR HIGHER °*CHILLS OR SWEATING °*NAUSEA AND VOMITING THAT IS NOT CONTROLLED WITH YOUR NAUSEA MEDICATION °*UNUSUAL SHORTNESS OF BREATH °*UNUSUAL BRUISING OR BLEEDING °*URINARY PROBLEMS (pain or burning when urinating, or frequent urination) °*BOWEL PROBLEMS (unusual diarrhea, constipation, pain near the anus) °TENDERNESS IN MOUTH AND THROAT WITH OR WITHOUT PRESENCE OF ULCERS (sore throat, sores in mouth, or a toothache) °UNUSUAL RASH, SWELLING OR PAIN  °UNUSUAL VAGINAL DISCHARGE OR ITCHING  ° °Items with * indicate a potential emergency and should be followed up as soon as possible or go to the Emergency Department if any problems should occur. ° °Please show the CHEMOTHERAPY ALERT CARD or IMMUNOTHERAPY ALERT CARD at check-in to  the Emergency Department and triage nurse. ° °Should you have questions after your visit or need to cancel or reschedule your appointment, please contact [facility]  [phone] and follow the prompts.  Office hours are 8:00 a.m. to 4:30 p.m. Monday - Friday. Please note that voicemails left after 4:00 p.m. may not be returned until the following business day.  We are closed weekends and major holidays. You have access to a nurse at all times for urgent questions. Please call the main number to the clinic [phone] and follow the prompts. ° °For any non-urgent questions, you may also contact your provider using MyChart. We now offer e-Visits for anyone 18 and older to request care online for non-urgent symptoms. For details visit [email or web address]. °  °Also download the MyChart app! Go to the app store, search "MyChart", open the app, select [facility], and log in with your MyChart username and password. ° °Due to Covid, a mask is required upon entering the hospital/clinic. If you do not have a mask, one will be given to you upon arrival. For doctor visits, patients may have 1 support person aged 18 or older with them. For treatment visits, patients cannot have anyone with them due to current Covid guidelines and our immunocompromised population.  °

## 2021-04-29 NOTE — Progress Notes (Signed)
Hematology/Oncology follow up  note [phone] [phone]   Patient Care Team: Mechele Claude, FNP as PCP - General (Family Medicine) [name], MD as Consulting Physician (Oncology) Mickeal Skinner Acey Lav, MD as Consulting Physician (Oncology) [name], MD as Consulting Physician (Cardiology)  REFERRING PROVIDER: Mechele Claude, FNP  CHIEF COMPLAINTS/REASON FOR VISIT:  Follow up for melanoma HISTORY OF PRESENTING ILLNESS:   Edward Pitts is a  61 y.o.  male with PMH listed below was seen in consultation at the request of  Mechele Claude, FNP  for evaluation of inguinal mass Patient presented to emergency room 3 days ago complaining about left ing uinal mass discomfort. Reports that he has really noticed the mass growing for the past 1 months. He has a history of left lower extremity melanoma in 2011, status post local excision.  Pain was increased with squatting of laxation. He was advised to take Tylenol for pain. Denies any fever, chills, night sweating.  He does feel mild nauseated. Appetite is fair.  He has lost about 10 pounds since earlier this year. 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  Left inguinal and left iliac nodes which are suspicious for involvement.  There are also 2 small nonspecific hypodense lesions within the right liver, nonspecific.  # patient underwent left groin mass resection on 04/16/2019. Resection pathology showed malignant melanoma, replacing a lymph node, with extracapsular extension, peripheral and deep margins involved.  Left inguinal contents, all 7 lymph nodes were negative for melanoma in the lymph nodes.  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  # PDL1 80% TPS  # 04/16/2019. underwent left groin mass resection   07/07/2019  Status post adjuvant radiation and finished  radiation  Patient has Mediport placed to facilitate immunotherapy treatments.  06/29/2020, CT chest abdomen pelvis showed stable postoperative appearance of the left groin.  No evidence of local recurrence.  No evidence of metastatic disease in the chest abdomen or pelvis.  Hepatic steatosis.  Stable subcentimeter fluid attenuation lesion of the lateral right lobe of the liver, likely benign cyst or hemangioma.  Coronary artery disease.  Aortic atherosclerosis  10/20/2020, CT chest abdomen pelvis showed stable postoperative/radiation appearance of the left groin.  No evidence of local recurrence/metastatic disease within the chest abdomen/pelvis.  Fatty liver disease.  Diverticulosis without evidence of typhlitis.  Aortic atherosclerosis  03/10/2021 MRI brain without contrast showed no definitive evidence of intracranial metastatic disease.  Study is limited by absence of intravenous contrast.  INTERVAL HISTORY Edward Pitts is a 61 y.o. male who has above history reviewed by me today presents for follow up visit for management of inguinal nodal recurrence of melanoma 04/18/2021, patient establish care with neurology Dr. Mickeal Skinner for intermittent altered cognition.  She was recommended to start Vimpat.  Patient reports that he sleeps better while taking this medication. Otherwise he has no new complaints.  :Review of Systems  Constitutional:  Negative for appetite change, chills, fatigue, fever and unexpected weight change.  HENT:   Negative for hearing loss and voice change.   Eyes:  Negative for eye problems and icterus.  Respiratory:  Negative for chest tightness, cough and shortness of breath.   Cardiovascular:  Negative for chest pain and leg swelling.  Gastrointestinal:  Negative for abdominal distention and abdominal pain.  Endocrine: Negative for hot flashes.  Genitourinary:  Negative for difficulty urinating,  dysuria and frequency.   Musculoskeletal:        Status post left hip replacement   Skin:  Negative for itching and rash.       Skin hypo-pigmentation on upper extremities, no change  Neurological:  Negative for light-headedness and numbness.       Spells  Hematological:  Negative for adenopathy. Does not bruise/bleed easily.  Psychiatric/Behavioral:  Negative for confusion.    MEDICAL HISTORY:  Past Medical History:  Diagnosis Date   Anemia    iron treatments   Anxiety    Aortic atherosclerosis (HCC)    Arthritis    Cancer of groin (HCC) 2021   left groin, resected, radiation   Cataract    Complication of anesthesia    PONV   Coronary artery disease    Dizziness of unknown etiology    has led to seizures and passing out.   Family history of adverse reaction to anesthesia    PONV mother   GERD (gastroesophageal reflux disease)    History of complete heart block    PPM placed   Hyperlipidemia    Hypertension    LBBB (left bundle branch block)    Lymphedema of left leg    uses thigh high compression stockings   Melanoma (HCC) 2012   skin cancer, left thigh   OSA on CPAP    PONV (postoperative nausea and vomiting) 04/16/2019   Port-A-Cath in place    RIGHT chest wall   Presence of cardiac pacemaker    [employer]   Seizures (HCC)    still has episodes of dizziness. last event 1 month ago (march 2022) and will pass out. takes clonazepam    SURGICAL HISTORY: Past Surgical History:  Procedure Laterality Date   CT RADIATION THERAPY GUIDE     left groin   dental implant     permanent implant   KNEE SURGERY Left    arthroscopy   LEFT HEART CATH AND CORONARY ANGIOGRAPHY Left 06/29/2017   Procedure: LEFT HEART CATH AND CORONARY ANGIOGRAPHY;  Surgeon: Lamar Blinks, MD;  Location: [facility]IVE [facility];  Service: Cardiovascular;  Laterality: Left;   LYMPH NODE DISSECTION Left 04/16/2019   Procedure: Left inguinal Lymph Node Dissection;  Surgeon: Almond Lint, MD;  Location: [facility] OR;  Service: General;  Laterality: Left;   MELANOMA EXCISION Left 04/16/2019    Procedure: MELANOMA EXCISION LEFT GROIN MASS;  Surgeon: Almond Lint, MD;  Location: [facility] OR;  Service: General;  Laterality: Left;   MELANOMA EXCISION WITH SENTINEL LYMPH NODE BIOPSY Left 2012   Left calf    PACEMAKER INSERTION N/A 08/26/2018   Procedure: INSERTION PACEMAKER;  Surgeon: Marcina Millard, MD;  Location: [facility] ORS;  Service: Cardiovascular;  Laterality: N/A;   PORTA CATH INSERTION N/A 08/26/2019   Procedure: PORTA CATH INSERTION;  Surgeon: Renford Dills, MD;  Location: [facility];  Service: Cardiovascular;  Laterality: N/A;   SUPERFICIAL LYMPH NODE BIOPSY / EXCISION Left 2020   lymph nodes removed around left groin melanoma site   TEMPORARY PACEMAKER N/A 08/25/2018   Procedure: TEMPORARY PACEMAKER;  Surgeon: Tonny Bollman, MD;  Location: [facility] CV [facility];  Service: Cardiovascular;  Laterality: N/A;   TOTAL HIP ARTHROPLASTY Left 07/14/2020   Procedure: TOTAL HIP ARTHROPLASTY;  Surgeon: Donato Heinz, MD;  Location: [facility] ORS;  Service: Orthopedics;  Laterality: Left;    SOCIAL HISTORY: Social History   Socioeconomic History   Marital status: Married    Spouse name: [name]  Number of children: 7   Years of education: 12   Highest education level: Not on file  Occupational History    Comment: disability  Tobacco Use   Smoking status: Never   Smokeless tobacco: Never  Vaping Use   Vaping Use: Never used  Substance and Sexual Activity   Alcohol use: No   Drug use: No   Sexual activity: Not Currently  Other Topics Concern   Not on file  Social History Narrative   Lives with  Wife,   Has 2 small dogs   Caffeine use: sodas (2 per day)      Out of work on disability.  Has a walk in shower. No stairs to climb   Oncology treatment ongoing. Uses port a cath for treatment.      pacemaker   Social Determinants of Health   Financial Resource Strain: Not on file  Food Insecurity: Not on file  Transportation Needs: Not on file  Physical Activity:  Not on file  Stress: Not on file  Social Connections: Not on file  Intimate Partner Violence: Not on file    FAMILY HISTORY: Family History  Problem Relation Age of Onset   Cancer Paternal Grandmother     ALLERGIES:  is allergic to ibuprofen and nsaids.  MEDICATIONS:  Current Outpatient Medications  Medication Sig Dispense Refill   allopurinol (ZYLOPRIM) 300 MG tablet Take 300 mg by mouth daily.     ASPIRIN 81 PO Take 1 tablet by mouth daily.     atorvastatin (LIPITOR) 20 MG tablet Take 20 mg by mouth daily.     clonazePAM (KLONOPIN) 0.5 MG tablet 1 tablet in the morning, 2 in the evening 90 tablet 3   hydrocortisone 2.5 % ointment Apply topically 2 (two) times daily.     ketoconazole (NIZORAL) 2 % cream Apply 1 application topically daily.     Lacosamide (VIMPAT) 100 MG TABS Take 1 tablet (100 mg total) by mouth in the morning and at bedtime. 60 tablet 3   lidocaine-prilocaine (EMLA) cream Apply 1 application topically as needed. Apply small amount of cream to port site approx 1-2 hours prior to appointment. 30 g 2   lisinopril (ZESTRIL) 20 MG tablet Take 20 mg by mouth daily.     metoprolol succinate (TOPROL-XL) 25 MG 24 hr tablet Take 25 mg by mouth daily.      Multiple Vitamin (MULTIVITAMIN WITH MINERALS) TABS tablet Take 1 tablet by mouth daily. Centrum Silver     nystatin (MYCOSTATIN/NYSTOP) powder Apply 1 application topically 3 (three) times daily. Apply small amount to affected area 3 times a day until healed 60 g 2   omeprazole (PRILOSEC) 20 MG capsule TAKE 1 CAPSULE BY MOUTH EVERY DAY 30 capsule 1   ondansetron (ZOFRAN) 4 MG tablet TAKE 1 TABLET BY MOUTH EVERY 8 HOURS AS NEEDED FOR NAUSEA AND VOMITING 90 tablet 1   oxyCODONE (OXY IR/ROXICODONE) 5 MG immediate release tablet Take 1 tablet (5 mg total) by mouth every 4 (four) hours as needed for moderate pain (pain score 4-6). (Patient not taking: Reported on 11/30/2020) 30 tablet 0   No current facility-administered  medications for this visit.     PHYSICAL EXAMINATION: ECOG PERFORMANCE STATUS: 1 - Symptomatic but completely ambulatory Vitals:   04/29/21 0838  BP: (!) 145/74  Pulse: 81  Temp: (!) 96.6 F (35.9 C)   Filed Weights   04/29/21 0838  Weight: 242 lb (109.8 kg)    Physical Exam Constitutional:  General: He is not in acute distress.    Comments: Patient ambulates independently  HENT:     Head: Normocephalic and atraumatic.  Eyes:     General: No scleral icterus.    Pupils: Pupils are equal, round, and reactive to light.  Cardiovascular:     Rate and Rhythm: Normal rate and regular rhythm.     Heart sounds: Normal heart sounds.  Pulmonary:     Effort: Pulmonary effort is normal. No respiratory distress.     Breath sounds: No wheezing.  Abdominal:     General: Bowel sounds are normal. There is no distension.     Palpations: Abdomen is soft. There is no mass.     Tenderness: There is no abdominal tenderness.     Comments:    Musculoskeletal:        General: No deformity. Normal range of motion.     Cervical back: Normal range of motion and neck supple.     Comments: Left lower extremity edema Left hip replacement  Skin:    General: Skin is warm and dry.  Neurological:     Mental Status: He is alert and oriented to person, place, and time. Mental status is at baseline.     Cranial Nerves: No cranial nerve deficit.     Coordination: Coordination normal.  Psychiatric:        Mood and Affect: Mood normal.    LABORATORY DATA:  I have reviewed the data as listed Lab Results  Component Value Date   WBC 4.4 04/29/2021   HGB 11.9 (L) 04/29/2021   HCT 35.3 (L) 04/29/2021   MCV 89.1 04/29/2021   PLT 194 04/29/2021   Recent Labs    04/01/21 0831 04/15/21 0842 04/29/21 0758  NA 137 137 136  K 4.2 4.2 3.7  CL 106 105 106  CO2 $Re'24 23 24  'xIR$ GLUCOSE 137* 149* 160*  BUN 34* 27* 22  CREATININE 1.39* 1.35* 1.17  CALCIUM 9.2 9.3 9.1  GFRNONAA 58* 60* >60  PROT 7.2  7.7 7.5  ALBUMIN 4.0 4.4 4.2  AST $Re'26 29 30  'gXH$ ALT 31 37 37  ALKPHOS 97 103 100  BILITOT 0.5 0.5 0.2*    Iron/TIBC/Ferritin/ %Sat    Component Value Date/Time   IRON 85 10/02/2019 0841   TIBC 342 10/02/2019 0841   FERRITIN 61 10/02/2019 0841   IRONPCTSAT 25 10/02/2019 0841      RADIOGRAPHIC STUDIES: I have personally reviewed the radiological images as listed and agreed with the findings in the report. MR Brain Wo Contrast  Result Date: 03/10/2021 CLINICAL DATA:  Malignant melanoma of overlapping sites. Skin cancer, monitor; Melanoma. EXAM: MRI HEAD WITHOUT CONTRAST TECHNIQUE: Multiplanar, multiecho pulse sequences of the brain and surrounding structures were obtained without intravenous contrast. COMPARISON:  MRI of the brain March 10, 2019. FINDINGS: Brain: No acute infarction, hemorrhage, hydrocephalus, extra-axial collection or mass lesion. Minimal amount of punctate foci T2 hyperintensity within the white matter of the cerebral hemispheres nonspecific, may represent early chronic microangiopathy. Vascular: Normal flow voids. Skull and upper cervical spine: Normal marrow signal. Sinuses/Orbits: Mucosal thickening of the right maxillary sinus. The orbits are. Other: Right mastoid effusion. IMPRESSION: No definitive evidence of intracranial metastatic disease. However, the study is limited by absence of intravenous contrast. Small lesions could be missed. Electronically Signed   By: Pedro Earls M.D.   On: 03/10/2021 12:08   CT CHEST ABDOMEN PELVIS WO CONTRAST  Result Date: 04/27/2021 CLINICAL DATA:  Melanoma  follow-up EXAM: CT CHEST, ABDOMEN AND PELVIS WITHOUT CONTRAST TECHNIQUE: Multidetector CT imaging of the chest, abdomen and pelvis was performed following the standard protocol without IV contrast. RADIATION DOSE REDUCTION: This exam was performed according to the departmental dose-optimization program which includes automated exposure control, adjustment of the mA  and/or kV according to patient size and/or use of iterative reconstruction technique. COMPARISON:  CT chest, abdomen and pelvis dated January 18, 2021 FINDINGS: CT CHEST FINDINGS Cardiovascular: Normal heart size. Left chest wall cardiac conduction device with leads in the right atrium and right ventricle. No pericardial effusion. Mild calcified plaque of the thoracic aorta. Coronary artery calcifications of the LAD. Right chest wall port with tip near the superior cavoatrial junction. Mediastinum/Nodes: Esophagus and thyroid are unremarkable. No pathologically enlarged lymph nodes seen in the chest. Lungs/Pleura: Central airways are patent. Mild bibasilar atelectasis. No consolidation, pleural effusion or pneumothorax. Musculoskeletal: No chest wall mass or suspicious bone lesions identified. CT ABDOMEN PELVIS FINDINGS Hepatobiliary: Unchanged tiny low-attenuation lesion of the right lobe of the liver located on image 51, likely a simple cyst. No suspicious liver lesions. Gallbladder is unremarkable. No biliary ductal dilation. Pancreas: Unremarkable. No pancreatic ductal dilatation or surrounding inflammatory changes. Spleen: Normal in size without focal abnormality. Adrenals/Urinary Tract: Bilateral adrenal glands are unremarkable. No hydronephrosis. Unchanged renal sinus cyst the lower pole of the left kidney. No nephrolithiasis. Bladder is unremarkable but incompletely visualized due to streak artifact. Stomach/Bowel: Stomach is within normal limits. No evidence of bowel wall thickening, distention, or inflammatory changes. Vascular/Lymphatic: Aortic atherosclerosis. No enlarged abdominal or pelvic lymph nodes. Reproductive: Prostate is unremarkable. Other: Unchanged moderate fat containing umbilical hernia. Stable postsurgical changes of the left groin. No abdominopelvic ascites. Musculoskeletal: Prior left total hip arthroplasty. No aggressive appearing osseous lesions. IMPRESSION: 1. Stable postoperative  changes of the left groin with no evidence of recurrent disease. 2. No evidence of metastatic disease in the chest, abdomen or pelvis. 3. Aortic Atherosclerosis (ICD10-I70.0). Electronically Signed   By: Yetta Glassman M.D.   On: 04/27/2021 13:16        ASSESSMENT & PLAN:  1. Encounter for antineoplastic immunotherapy   2. Malignant melanoma of overlapping sites ([place])   3. Episodes of altered cognition   4. Vitiligo   5. Anemia, unspecified type   6. Stage 3a chronic kidney disease (HCC)    #Left inguinal nodal recurrence of melanoma.  Status post resection and adjuvant radiation. Stage IV Recurrent, now in NED 04/26/2021, CT chest abdomen pelvis without contrast showed stable post operative changes of left groin with no evidence of recurrent disease.  No evidence of metastatic disease in the chest abdomen pelvis.  Aortic atherosclerosis. Labs reviewed and discussed with patient Proceed with nivolumab treatment today.   Chronic kidney disease, avoid nephrotoxins.  Encourage oral hydration.   #Elevated blood glucose level, HbA1c is 5.1.  #Vitiligo, stable #Normocytic anemia, hemoglobin is at 11.9 stable. #Intermittent altered cognition.  Started on Vimpat continue follow-up with neurology.   Follow-up 2 weeks at [facility] Nivolumab treatment. We spent sufficient time to discuss many aspect of care, questions were answered to patient's satisfaction.   [name], MD, PhD  04/29/2021

## 2021-04-29 NOTE — Progress Notes (Signed)
Patient here for follow up. Patient denies any concerns. °

## 2021-05-13 ENCOUNTER — Inpatient Hospital Stay (HOSPITAL_BASED_OUTPATIENT_CLINIC_OR_DEPARTMENT_OTHER): Payer: Medicaid Other | Admitting: Oncology

## 2021-05-13 ENCOUNTER — Other Ambulatory Visit: Payer: Self-pay

## 2021-05-13 ENCOUNTER — Inpatient Hospital Stay: Payer: Medicaid Other

## 2021-05-13 ENCOUNTER — Encounter: Payer: Self-pay | Admitting: Oncology

## 2021-05-13 VITALS — BP 145/64 | HR 82 | Temp 97.0°F | Resp 18 | Wt 252.0 lb

## 2021-05-13 DIAGNOSIS — N1831 Chronic kidney disease, stage 3a: Secondary | ICD-10-CM | POA: Diagnosis not present

## 2021-05-13 DIAGNOSIS — R4189 Other symptoms and signs involving cognitive functions and awareness: Secondary | ICD-10-CM

## 2021-05-13 DIAGNOSIS — Z5112 Encounter for antineoplastic immunotherapy: Secondary | ICD-10-CM | POA: Diagnosis not present

## 2021-05-13 DIAGNOSIS — C438 Malignant melanoma of overlapping sites of skin: Secondary | ICD-10-CM | POA: Diagnosis not present

## 2021-05-13 DIAGNOSIS — D649 Anemia, unspecified: Secondary | ICD-10-CM

## 2021-05-13 LAB — CBC WITH DIFFERENTIAL/PLATELET
Abs Immature Granulocytes: 0.03 10*3/uL (ref 0.00–0.07)
Basophils Absolute: 0 10*3/uL (ref 0.0–0.1)
Basophils Relative: 1 %
Eosinophils Absolute: 0.1 10*3/uL (ref 0.0–0.5)
Eosinophils Relative: 4 %
HCT: 33.2 % — ABNORMAL LOW (ref 39.0–52.0)
Hemoglobin: 11 g/dL — ABNORMAL LOW (ref 13.0–17.0)
Immature Granulocytes: 1 %
Lymphocytes Relative: 22 %
Lymphs Abs: 0.7 10*3/uL (ref 0.7–4.0)
MCH: 30.2 pg (ref 26.0–34.0)
MCHC: 33.1 g/dL (ref 30.0–36.0)
MCV: 91.2 fL (ref 80.0–100.0)
Monocytes Absolute: 0.5 10*3/uL (ref 0.1–1.0)
Monocytes Relative: 14 %
Neutro Abs: 2 10*3/uL (ref 1.7–7.7)
Neutrophils Relative %: 58 %
Platelets: 158 10*3/uL (ref 150–400)
RBC: 3.64 MIL/uL — ABNORMAL LOW (ref 4.22–5.81)
RDW: 13.2 % (ref 11.5–15.5)
WBC: 3.3 10*3/uL — ABNORMAL LOW (ref 4.0–10.5)
nRBC: 0 % (ref 0.0–0.2)

## 2021-05-13 LAB — COMPREHENSIVE METABOLIC PANEL
ALT: 49 U/L — ABNORMAL HIGH (ref 0–44)
AST: 32 U/L (ref 15–41)
Albumin: 3.8 g/dL (ref 3.5–5.0)
Alkaline Phosphatase: 88 U/L (ref 38–126)
Anion gap: 6 (ref 5–15)
BUN: 17 mg/dL (ref 8–23)
CO2: 27 mmol/L (ref 22–32)
Calcium: 8.8 mg/dL — ABNORMAL LOW (ref 8.9–10.3)
Chloride: 104 mmol/L (ref 98–111)
Creatinine, Ser: 1.15 mg/dL (ref 0.61–1.24)
GFR, Estimated: >60 mL/min (ref >60)
Glucose, Bld: 142 mg/dL — ABNORMAL HIGH (ref 70–99)
Potassium: 3.5 mmol/L (ref 3.5–5.1)
Sodium: 137 mmol/L (ref 135–145)
Total Bilirubin: 0.4 mg/dL (ref 0.3–1.2)
Total Protein: 6.8 g/dL (ref 6.5–8.1)

## 2021-05-13 MED ORDER — HEPARIN SOD (PORK) LOCK FLUSH 100 UNIT/ML IV SOLN
500.0000 [IU] | Freq: Once | INTRAVENOUS | Status: AC | PRN
  Filled 2021-05-13: qty 5

## 2021-05-13 MED ORDER — SODIUM CHLORIDE 0.9 % IV SOLN
Freq: Once | INTRAVENOUS | Status: AC
  Filled 2021-05-13: qty 250

## 2021-05-13 MED ORDER — SODIUM CHLORIDE 0.9 % IV SOLN
240.0000 mg | Freq: Once | INTRAVENOUS | Status: AC
  Administered 2021-05-13: 240 mg via INTRAVENOUS
  Filled 2021-05-13: qty 24

## 2021-05-13 MED ORDER — HEPARIN SOD (PORK) LOCK FLUSH 100 UNIT/ML IV SOLN
INTRAVENOUS | Status: AC
  Administered 2021-05-13: 500 [IU]
  Filled 2021-05-13: qty 5

## 2021-05-13 NOTE — Progress Notes (Signed)
Hematology/Oncology Progress note [phone] [phone]      Patient Care Team: Mechele Claude, FNP as PCP - General (Family Medicine) [name], MD as Consulting Physician (Oncology) Mickeal Skinner Acey Lav, MD as Consulting Physician (Oncology) [name], MD as Consulting Physician (Cardiology)  REFERRING PROVIDER: Mechele Claude, FNP  CHIEF COMPLAINTS/REASON FOR VISIT:  Follow up for melanoma HISTORY OF PRESENTING ILLNESS:   Edward Pitts is a  61 y.o.  male with PMH listed below was seen in consultation at the request of  Mechele Claude, FNP  for evaluation of inguinal mass Patient presented to emergency room 3 days ago complaining about left ing uinal mass discomfort. Reports that he has really noticed the mass growing for the past 1 months. He has a history of left lower extremity melanoma in 2011, status post local excision.  Pain was increased with squatting of laxation. He was advised to take Tylenol for pain. Denies any fever, chills, night sweating.  He does feel mild nauseated. Appetite is fair.  He has lost about 10 pounds since earlier this year. 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  Left inguinal and left iliac nodes which are suspicious for involvement.  There are also 2 small nonspecific hypodense lesions within the right liver, nonspecific.  # patient underwent left groin mass resection on 04/16/2019. Resection pathology showed malignant melanoma, replacing a lymph node, with extracapsular extension, peripheral and deep margins involved.  Left inguinal contents, all 7 lymph nodes were negative for melanoma in the lymph nodes.  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  # PDL1 80% TPS  # 04/16/2019. underwent left groin mass resection   07/07/2019  Status post adjuvant radiation and finished  radiation  Patient has Mediport placed to facilitate immunotherapy treatments.  06/29/2020, CT chest abdomen pelvis showed stable postoperative appearance of the left groin.  No evidence of local recurrence.  No evidence of metastatic disease in the chest abdomen or pelvis.  Hepatic steatosis.  Stable subcentimeter fluid attenuation lesion of the lateral right lobe of the liver, likely benign cyst or hemangioma.  Coronary artery disease.  Aortic atherosclerosis  10/20/2020, CT chest abdomen pelvis showed stable postoperative/radiation appearance of the left groin.  No evidence of local recurrence/metastatic disease within the chest abdomen/pelvis.  Fatty liver disease.  Diverticulosis without evidence of typhlitis.  Aortic atherosclerosis  03/10/2021 MRI brain without contrast showed no definitive evidence of intracranial metastatic disease.  Study is limited by absence of intravenous contrast.  04/18/2021, patient establish care with neurology Dr. Mickeal Skinner for intermittent altered cognition.  he was recommended to start Vimpat.    04/26/2021, CT chest abdomen pelvis without contrast showed stable post operative changes of left groin with no evidence of recurrent disease.  No evidence of metastatic disease in the chest abdomen pelvis.  Aortic atherosclerosis.  INTERVAL HISTORY Edward Pitts is a 61 y.o. male who has above history reviewed by me today presents for follow up visit for management of inguinal nodal recurrence of melanoma Patient reports sleeping better after being started on Vimpat.  Episode of altered cognition has improved.  However he reports having a worse episode last night.  He had a blurry vision during the episode.  He took Klonopin with symptom improvement. No other new concerns.  Denies any shortness of breath, nausea vomiting or diarrhea.  :Review of Systems  Constitutional:  Negative for appetite change, chills, fatigue, fever and unexpected weight change.  HENT:   Negative for  hearing loss and voice change.   Eyes:  Negative for eye problems and icterus.  Respiratory:  Negative for chest tightness, cough and shortness of breath.   Cardiovascular:  Negative for chest pain and leg swelling.  Gastrointestinal:  Negative for abdominal distention and abdominal pain.  Endocrine: Negative for hot flashes.  Genitourinary:  Negative for difficulty urinating, dysuria and frequency.   Musculoskeletal:        Status post left hip replacement  Skin:  Negative for itching and rash.       Skin hypo-pigmentation on upper extremities, no change  Neurological:  Negative for light-headedness and numbness.       Spells  Hematological:  Negative for adenopathy. Does not bruise/bleed easily.  Psychiatric/Behavioral:  Negative for confusion.    MEDICAL HISTORY:  Past Medical History:  Diagnosis Date   Anemia    iron treatments   Anxiety    Aortic atherosclerosis (HCC)    Arthritis    Cancer of groin ([place]) 2021   left groin, resected, radiation   Cataract    Complication of anesthesia    PONV   Coronary artery disease    Dizziness of unknown etiology    has led to seizures and passing out.   Family history of adverse reaction to anesthesia    PONV mother   GERD (gastroesophageal reflux disease)    History of complete heart block    PPM placed   Hyperlipidemia    Hypertension    LBBB (left bundle branch block)    Lymphedema of left leg    uses thigh high compression stockings   Melanoma ([place]) 2012   skin cancer, left thigh   OSA on CPAP    PONV (postoperative nausea and vomiting) 04/16/2019   Port-A-Cath in place    RIGHT chest wall   Presence of cardiac pacemaker    [employer]   Seizures ([place])    still has episodes of dizziness. last event 1 month ago (march 2022) and will pass out. takes clonazepam    SURGICAL HISTORY: Past Surgical History:  Procedure Laterality Date   CT RADIATION THERAPY GUIDE     left groin   dental implant     permanent implant    KNEE SURGERY Left    arthroscopy   LEFT HEART CATH AND CORONARY ANGIOGRAPHY Left 06/29/2017   Procedure: LEFT HEART CATH AND CORONARY ANGIOGRAPHY;  Surgeon: [name], MD;  Location: [facility];  Service: Cardiovascular;  Laterality: Left;   LYMPH NODE DISSECTION Left 04/16/2019   Procedure: Left inguinal Lymph Node Dissection;  Surgeon: [name], MD;  Location: [facility];  Service: General;  Laterality: Left;   MELANOMA EXCISION Left 04/16/2019   Procedure: MELANOMA EXCISION LEFT GROIN MASS;  Surgeon: [name], MD;  Location: [facility];  Service: General;  Laterality: Left;   MELANOMA EXCISION WITH SENTINEL LYMPH NODE BIOPSY Left 2012   Left calf    PACEMAKER INSERTION N/A 08/26/2018   Procedure: INSERTION PACEMAKER;  Surgeon: [name], MD;  Location: [facility];  Service: Cardiovascular;  Laterality: N/A;   PORTA CATH INSERTION N/A 08/26/2019   Procedure: PORTA CATH INSERTION;  Surgeon: Katha Cabal, MD;  Location: [facility];  Service: Cardiovascular;  Laterality: N/A;   SUPERFICIAL LYMPH NODE BIOPSY / EXCISION Left 2020   lymph nodes removed around left groin melanoma site  TEMPORARY PACEMAKER N/A 08/25/2018   Procedure: TEMPORARY PACEMAKER;  Surgeon: [name], MD;  Location: [facility];  Service: Cardiovascular;  Laterality: N/A;   TOTAL HIP ARTHROPLASTY Left 07/14/2020   Procedure: TOTAL HIP ARTHROPLASTY;  Surgeon: Dereck Leep, MD;  Location: [facility];  Service: Orthopedics;  Laterality: Left;    SOCIAL HISTORY: Social History   Socioeconomic History   Marital status: Married    Spouse name: Vicente Males    Number of children: 7   Years of education: 12   Highest education level: Not on file  Occupational History    Comment: disability  Tobacco Use   Smoking status: Never   Smokeless tobacco: Never  Vaping Use   Vaping Use: Never used  Substance and Sexual Activity   Alcohol use: No   Drug use: No   Sexual activity: Not  Currently  Other Topics Concern   Not on file  Social History Narrative   Lives with  Wife,   Has 2 small dogs   Caffeine use: sodas (2 per day)      Out of work on disability.  Has a walk in shower. No stairs to climb   Oncology treatment ongoing. Uses port a cath for treatment.      pacemaker   Social Determinants of Health   Financial Resource Strain: Not on file  Food Insecurity: Not on file  Transportation Needs: Not on file  Physical Activity: Not on file  Stress: Not on file  Social Connections: Not on file  Intimate Partner Violence: Not on file    FAMILY HISTORY: Family History  Problem Relation Age of Onset   Cancer Paternal Grandmother     ALLERGIES:  is allergic to ibuprofen and nsaids.  MEDICATIONS:  Current Outpatient Medications  Medication Sig Dispense Refill   allopurinol (ZYLOPRIM) 300 MG tablet Take 300 mg by mouth daily.     ASPIRIN 81 PO Take 1 tablet by mouth daily.     atorvastatin (LIPITOR) 20 MG tablet Take 20 mg by mouth daily.     clonazePAM (KLONOPIN) 0.5 MG tablet 1 tablet in the morning, 2 in the evening 90 tablet 3   hydrocortisone 2.5 % ointment Apply topically 2 (two) times daily.     ketoconazole (NIZORAL) 2 % cream Apply 1 application topically daily.     Lacosamide (VIMPAT) 100 MG TABS Take 1 tablet (100 mg total) by mouth in the morning and at bedtime. 60 tablet 3   lidocaine-prilocaine (EMLA) cream Apply 1 application topically as needed. Apply small amount of cream to port site approx 1-2 hours prior to appointment. 30 g 2   lisinopril (ZESTRIL) 20 MG tablet Take 20 mg by mouth daily.     metoprolol succinate (TOPROL-XL) 25 MG 24 hr tablet Take 25 mg by mouth daily.      Multiple Vitamin (MULTIVITAMIN WITH MINERALS) TABS tablet Take 1 tablet by mouth daily. Centrum Silver     nystatin (MYCOSTATIN/NYSTOP) powder Apply 1 application topically 3 (three) times daily. Apply small amount to affected area 3 times a day until healed 60 g 2    omeprazole (PRILOSEC) 20 MG capsule TAKE 1 CAPSULE BY MOUTH EVERY DAY 30 capsule 1   ondansetron (ZOFRAN) 4 MG tablet TAKE 1 TABLET BY MOUTH EVERY 8 HOURS AS NEEDED FOR NAUSEA AND VOMITING 90 tablet 1   oxyCODONE (OXY IR/ROXICODONE) 5 MG immediate release tablet Take 1 tablet (5 mg total) by mouth every 4 (four) hours as needed  for moderate pain (pain score 4-6). (Patient not taking: Reported on 11/30/2020) 30 tablet 0   No current facility-administered medications for this visit.     PHYSICAL EXAMINATION: ECOG PERFORMANCE STATUS: 1 - Symptomatic but completely ambulatory Vitals:   05/13/21 0850  BP: (!) 145/64  Pulse: 82  Resp: 18  Temp: (!) 97 F (36.1 C)   Filed Weights   05/13/21 0850  Weight: 252 lb (114.3 kg)    Physical Exam Constitutional:      General: He is not in acute distress.    Comments: Patient ambulates independently  HENT:     Head: Normocephalic and atraumatic.  Eyes:     General: No scleral icterus.    Pupils: Pupils are equal, round, and reactive to light.  Cardiovascular:     Rate and Rhythm: Normal rate and regular rhythm.     Heart sounds: Normal heart sounds.  Pulmonary:     Effort: Pulmonary effort is normal. No respiratory distress.     Breath sounds: No wheezing.  Abdominal:     General: Bowel sounds are normal. There is no distension.     Palpations: Abdomen is soft. There is no mass.     Tenderness: There is no abdominal tenderness.     Comments:    Musculoskeletal:        General: No deformity. Normal range of motion.     Cervical back: Normal range of motion and neck supple.     Comments: Left lower extremity edema Left hip replacement  Skin:    General: Skin is warm and dry.  Neurological:     Mental Status: He is alert and oriented to person, place, and time. Mental status is at baseline.     Cranial Nerves: No cranial nerve deficit.     Coordination: Coordination normal.  Psychiatric:        Mood and Affect: Mood normal.     LABORATORY DATA:  I have reviewed the data as listed Lab Results  Component Value Date   WBC 3.3 (L) 05/13/2021   HGB 11.0 (L) 05/13/2021   HCT 33.2 (L) 05/13/2021   MCV 91.2 05/13/2021   PLT 158 05/13/2021   Recent Labs    04/15/21 0842 04/29/21 0758 05/13/21 0759  NA 137 136 137  K 4.2 3.7 3.5  CL 105 106 104  CO2 _0 GLUCOSE 149* 160* 142*  BUN 27* 22 17  CREATININE 1.35* 1.17 1.15  CALCIUM 9.3 9.1 8.8*  GFRNONAA 60* >60 >60  PROT 7.7 7.5 6.8  ALBUMIN 4.4 4.2 3.8  AST 29 30 32  ALT 37 37 49*  ALKPHOS 103 100 88  BILITOT 0.5 0.2* 0.4    Iron/TIBC/Ferritin/ %Sat    Component Value Date/Time   IRON 85 10/02/2019 0841   TIBC 342 10/02/2019 0841   FERRITIN 61 10/02/2019 0841   IRONPCTSAT 25 10/02/2019 0841      RADIOGRAPHIC STUDIES: I have personally reviewed the radiological images as listed and agreed with the findings in the report. MR Brain Wo Contrast  Result Date: 03/10/2021 CLINICAL DATA:  Malignant melanoma of overlapping sites. Skin cancer, monitor; Melanoma. EXAM: MRI HEAD WITHOUT CONTRAST TECHNIQUE: Multiplanar, multiecho pulse sequences of the brain and surrounding structures were obtained without intravenous contrast. COMPARISON:  MRI of the brain March 10, 2019. FINDINGS: Brain: No acute infarction, hemorrhage, hydrocephalus, extra-axial collection or mass lesion. Minimal amount of punctate foci T2 hyperintensity within the white matter of the cerebral hemispheres nonspecific,  may represent early chronic microangiopathy. Vascular: Normal flow voids. Skull and upper cervical spine: Normal marrow signal. Sinuses/Orbits: Mucosal thickening of the right maxillary sinus. The orbits are. Other: Right mastoid effusion. IMPRESSION: No definitive evidence of intracranial metastatic disease. However, the study is limited by absence of intravenous contrast. Small lesions could be missed. Electronically Signed   By: Pedro Earls M.D.    On: 03/10/2021 12:08   CT CHEST ABDOMEN PELVIS WO CONTRAST  Result Date: 04/27/2021 CLINICAL DATA:  Melanoma follow-up EXAM: CT CHEST, ABDOMEN AND PELVIS WITHOUT CONTRAST TECHNIQUE: Multidetector CT imaging of the chest, abdomen and pelvis was performed following the standard protocol without IV contrast. RADIATION DOSE REDUCTION: This exam was performed according to the departmental dose-optimization program which includes automated exposure control, adjustment of the mA and/or kV according to patient size and/or use of iterative reconstruction technique. COMPARISON:  CT chest, abdomen and pelvis dated January 18, 2021 FINDINGS: CT CHEST FINDINGS Cardiovascular: Normal heart size. Left chest wall cardiac conduction device with leads in the right atrium and right ventricle. No pericardial effusion. Mild calcified plaque of the thoracic aorta. Coronary artery calcifications of the LAD. Right chest wall port with tip near the superior cavoatrial junction. Mediastinum/Nodes: Esophagus and thyroid are unremarkable. No pathologically enlarged lymph nodes seen in the chest. Lungs/Pleura: Central airways are patent. Mild bibasilar atelectasis. No consolidation, pleural effusion or pneumothorax. Musculoskeletal: No chest wall mass or suspicious bone lesions identified. CT ABDOMEN PELVIS FINDINGS Hepatobiliary: Unchanged tiny low-attenuation lesion of the right lobe of the liver located on image 51, likely a simple cyst. No suspicious liver lesions. Gallbladder is unremarkable. No biliary ductal dilation. Pancreas: Unremarkable. No pancreatic ductal dilatation or surrounding inflammatory changes. Spleen: Normal in size without focal abnormality. Adrenals/Urinary Tract: Bilateral adrenal glands are unremarkable. No hydronephrosis. Unchanged renal sinus cyst the lower pole of the left kidney. No nephrolithiasis. Bladder is unremarkable but incompletely visualized due to streak artifact. Stomach/Bowel: Stomach is within  normal limits. No evidence of bowel wall thickening, distention, or inflammatory changes. Vascular/Lymphatic: Aortic atherosclerosis. No enlarged abdominal or pelvic lymph nodes. Reproductive: Prostate is unremarkable. Other: Unchanged moderate fat containing umbilical hernia. Stable postsurgical changes of the left groin. No abdominopelvic ascites. Musculoskeletal: Prior left total hip arthroplasty. No aggressive appearing osseous lesions. IMPRESSION: 1. Stable postoperative changes of the left groin with no evidence of recurrent disease. 2. No evidence of metastatic disease in the chest, abdomen or pelvis. 3. Aortic Atherosclerosis (ICD10-I70.0). Electronically Signed   By: Yetta Glassman M.D.   On: 04/27/2021 13:16        ASSESSMENT & PLAN:  1. Encounter for antineoplastic immunotherapy   2. Malignant melanoma of overlapping sites ([place])   3. Episodes of altered cognition   4. Stage 3a chronic kidney disease ([place])   5. Anemia, unspecified type    #Left inguinal nodal recurrence of melanoma.  Status post resection and adjuvant radiation. Stage IV Recurrent, now in NED Labs reviewed and discussed with patient Proceed with nivolumab treatment today.  Chronic kidney disease, avoid nephrotoxins.  Encourage oral hydration.  Kidney function is stable.  #Vitiligo, stable #Normocytic anemia, hemoglobin is at 11.0 stable.  #Intermittent altered cognition.  Discussed with neurology.  Continue Vimpat  he will follow-up with neurology.   Follow-up 2 weeks at [facility] Nivolumab treatment. We spent sufficient time to discuss many aspect of care, questions were answered to patient's satisfaction.   [name], MD, PhD  05/13/2021

## 2021-05-13 NOTE — Progress Notes (Signed)
Pt received nivolumab infusion in clinic today. Tolerated well. No complaints at d/c. 

## 2021-05-13 NOTE — Patient Instructions (Signed)
[facility] AT [place]   Discharge Instructions: Thank you for choosing [facility] to provide your oncology and hematology care.  If you have a lab appointment with the [facility], please go directly to the [facility] and check in at the registration area.  Wear comfortable clothing and clothing appropriate for easy access to any Portacath or PICC line.   We strive to give you quality time with your provider. You may need to reschedule your appointment if you arrive late (15 or more minutes).  Arriving late affects you and other patients whose appointments are after yours.  Also, if you miss three or more appointments without notifying the office, you may be dismissed from the clinic at the providers discretion.      For prescription refill requests, have your pharmacy contact our office and allow 72 hours for refills to be completed.    Today you received the following chemotherapy and/or immunotherapy agents - Nivolumab      To help prevent nausea and vomiting after your treatment, we encourage you to take your nausea medication as directed.  BELOW ARE SYMPTOMS THAT SHOULD BE REPORTED IMMEDIATELY: *FEVER GREATER THAN 100.4 F (38 C) OR HIGHER *CHILLS OR SWEATING *NAUSEA AND VOMITING THAT IS NOT CONTROLLED WITH YOUR NAUSEA MEDICATION *UNUSUAL SHORTNESS OF BREATH *UNUSUAL BRUISING OR BLEEDING *URINARY PROBLEMS (pain or burning when urinating, or frequent urination) *BOWEL PROBLEMS (unusual diarrhea, constipation, pain near the anus) TENDERNESS IN MOUTH AND THROAT WITH OR WITHOUT PRESENCE OF ULCERS (sore throat, sores in mouth, or a toothache) UNUSUAL RASH, SWELLING OR PAIN  UNUSUAL VAGINAL DISCHARGE OR ITCHING   Items with * indicate a potential emergency and should be followed up as soon as possible or go to the Emergency Department if any problems should occur.  Please show the CHEMOTHERAPY ALERT CARD or IMMUNOTHERAPY ALERT CARD at check-in  to the Emergency Department and triage nurse.  Should you have questions after your visit or need to cancel or reschedule your appointment, please contact [facility] [place] [facility] [place]  [phone] and follow the prompts.  Office hours are 8:00 a.m. to 4:30 p.m. Monday - Friday. Please note that voicemails left after 4:00 p.m. may not be returned until the following business day.  We are closed weekends and major holidays. You have access to a nurse at all times for urgent questions. Please call the main number to the clinic [phone] and follow the prompts.  For any non-urgent questions, you may also contact your provider using MyChart. We now offer e-Visits for anyone 49 and older to request care online for non-urgent symptoms. For details visit [email or web address].   Also download the MyChart app! Go to the app store, search "MyChart", open the app, select [facility], and log in with your MyChart username and password.  Due to Covid, a mask is required upon entering the hospital/clinic. If you do not have a mask, one will be given to you upon arrival. For doctor visits, patients may have 1 support person aged 66 or older with them. For treatment visits, patients cannot have anyone with them due to current Covid guidelines and our immunocompromised population.   Nivolumab injection What is this medication? NIVOLUMAB (nye VOL ue mab) is a monoclonal antibody. It treats certain types of cancer. Some of the cancers treated are colon cancer, head and neck cancer, Hodgkin lymphoma, lung cancer, and melanoma. This medicine may be used for other purposes; ask your health care provider or  pharmacist if you have questions. COMMON BRAND NAME(S): Opdivo What should I tell my care team before I take this medication? They need to know if you have any of these conditions: Autoimmune diseases such as Crohn's disease, ulcerative colitis, or lupus Have had or planning to have an allogeneic  stem cell transplant (uses someone else's stem cells) History of chest radiation Organ transplant Nervous system problems such as myasthenia gravis or Guillain-Barre syndrome An unusual or allergic reaction to nivolumab, other medicines, foods, dyes, or preservatives Pregnant or trying to get pregnant Breast-feeding How should I use this medication? This medication is injected into a vein. It is given in a hospital or clinic setting. A special MedGuide will be given to you before each treatment. Be sure to read this information carefully each time. Talk to your care team regarding the use of this medication in children. While it may be prescribed for children as young as 12 years for selected conditions, precautions do apply. Overdosage: If you think you have taken too much of this medicine contact a poison control center or emergency room at once. NOTE: This medicine is only for you. Do not share this medicine with others. What if I miss a dose? Keep appointments for follow-up doses. It is important not to miss your dose. Call your care team if you are unable to keep an appointment. What may interact with this medication? Interactions have not been studied. This list may not describe all possible interactions. Give your health care provider a list of all the medicines, herbs, non-prescription drugs, or dietary supplements you use. Also tell them if you smoke, drink alcohol, or use illegal drugs. Some items may interact with your medicine. What should I watch for while using this medication? Your condition will be monitored carefully while you are receiving this medication. You may need blood work done while you are taking this medication. Do not become pregnant while taking this medication or for 5 months after stopping it. Women should inform their care team if they wish to become pregnant or think they might be pregnant. There is a potential for serious harm to an unborn child. Talk to your  care team for more information. Do not breast-feed an infant while taking this medication or for 5 months after stopping it. What side effects may I notice from receiving this medication? Side effects that you should report to your care team as soon as possible: Allergic reactions--skin rash, itching, hives, swelling of the face, lips, tongue, or throat Bloody or black, tar-like stools Change in vision Chest pain Diarrhea Dry cough, shortness of breath or trouble breathing Eye pain Fast or irregular heartbeat Fever, chills High blood sugar (hyperglycemia)--increased thirst or amount of urine, unusual weakness or fatigue, blurry vision High thyroid levels (hyperthyroidism)--fast or irregular heartbeat, weight loss, excessive sweating or sensitivity to heat, tremors or shaking, anxiety, nervousness, irregular menstrual cycle or spotting Kidney injury--decrease in the amount of urine, swelling of the ankles, hands, or feet Liver injury--right upper belly pain, loss of appetite, nausea, light-colored stool, dark yellow or brown urine, yellowing skin or eyes, unusual weakness or fatigue Low red blood cell count--unusual weakness or fatigue, dizziness, headache, trouble breathing Low thyroid levels (hypothyroidism)--unusual weakness or fatigue, increased sensitivity to cold, constipation, hair loss, dry skin, weight gain, feelings of depression Mood and behavior changes-confusion, change in sex drive or performance, irritability Muscle pain or cramps Pain, tingling, or numbness in the hands or feet, muscle weakness, trouble walking, loss of balance  or coordination Red or dark brown urine Redness, blistering, peeling, or loosening of the skin, including inside the mouth Stomach pain Unusual bruising or bleeding Side effects that usually do not require medical attention (report to your care team if they continue or are bothersome): Bone pain Constipation Loss of  appetite Nausea Tiredness Vomiting This list may not describe all possible side effects. Call your doctor for medical advice about side effects. You may report side effects to FDA at 1-800-FDA-1088. Where should I keep my medication? This medication is given in a hospital or clinic and will not be stored at home. NOTE: This sheet is a summary. It may not cover all possible information. If you have questions about this medicine, talk to your doctor, pharmacist, or health care provider.  2022 [employer] Standard (2020-11-30 00:00:00)

## 2021-05-13 NOTE — Progress Notes (Signed)
Pt here for follow up. No new concerns voiced.   

## 2021-05-20 ENCOUNTER — Other Ambulatory Visit: Payer: Self-pay | Admitting: Oncology

## 2021-05-24 ENCOUNTER — Encounter: Payer: Self-pay | Admitting: Oncology

## 2021-05-27 ENCOUNTER — Inpatient Hospital Stay: Payer: Medicaid Other

## 2021-05-27 ENCOUNTER — Ambulatory Visit
Admission: RE | Admit: 2021-05-27 | Discharge: 2021-05-27 | Disposition: A | Discharge status: Home / Self Care | Attending: Oncology | Admitting: Oncology

## 2021-05-27 ENCOUNTER — Ambulatory Visit
Admission: RE | Admit: 2021-05-27 | Discharge: 2021-05-27 | Disposition: A | Discharge status: Home / Self Care | Source: Ambulatory Visit | Attending: Oncology | Admitting: Oncology

## 2021-05-27 ENCOUNTER — Other Ambulatory Visit: Payer: Self-pay

## 2021-05-27 ENCOUNTER — Inpatient Hospital Stay: Payer: Medicaid Other | Attending: Oncology

## 2021-05-27 ENCOUNTER — Inpatient Hospital Stay (HOSPITAL_BASED_OUTPATIENT_CLINIC_OR_DEPARTMENT_OTHER): Payer: Medicaid Other | Admitting: Oncology

## 2021-05-27 ENCOUNTER — Encounter: Payer: Self-pay | Admitting: Oncology

## 2021-05-27 VITALS — BP 172/73 | HR 71 | Temp 97.0°F | Wt 252.0 lb

## 2021-05-27 DIAGNOSIS — D649 Anemia, unspecified: Secondary | ICD-10-CM | POA: Diagnosis not present

## 2021-05-27 DIAGNOSIS — C4359 Malignant melanoma of other part of trunk: Secondary | ICD-10-CM | POA: Insufficient documentation

## 2021-05-27 DIAGNOSIS — Z452 Encounter for adjustment and management of vascular access device: Secondary | ICD-10-CM | POA: Insufficient documentation

## 2021-05-27 DIAGNOSIS — N1831 Chronic kidney disease, stage 3a: Secondary | ICD-10-CM | POA: Insufficient documentation

## 2021-05-27 DIAGNOSIS — R42 Dizziness and giddiness: Secondary | ICD-10-CM

## 2021-05-27 DIAGNOSIS — C438 Malignant melanoma of overlapping sites of skin: Secondary | ICD-10-CM

## 2021-05-27 DIAGNOSIS — L80 Vitiligo: Secondary | ICD-10-CM | POA: Diagnosis not present

## 2021-05-27 DIAGNOSIS — R0602 Shortness of breath: Secondary | ICD-10-CM

## 2021-05-27 DIAGNOSIS — Z5112 Encounter for antineoplastic immunotherapy: Secondary | ICD-10-CM | POA: Diagnosis present

## 2021-05-27 DIAGNOSIS — R569 Unspecified convulsions: Secondary | ICD-10-CM | POA: Insufficient documentation

## 2021-05-27 DIAGNOSIS — Z79899 Other long term (current) drug therapy: Secondary | ICD-10-CM | POA: Insufficient documentation

## 2021-05-27 LAB — CBC WITH DIFFERENTIAL/PLATELET
Abs Immature Granulocytes: 0.05 10*3/uL (ref 0.00–0.07)
Basophils Absolute: 0 10*3/uL (ref 0.0–0.1)
Basophils Relative: 0 %
Eosinophils Absolute: 0.2 10*3/uL (ref 0.0–0.5)
Eosinophils Relative: 3 %
HCT: 34.7 % — ABNORMAL LOW (ref 39.0–52.0)
Hemoglobin: 11.5 g/dL — ABNORMAL LOW (ref 13.0–17.0)
Immature Granulocytes: 1 %
Lymphocytes Relative: 24 %
Lymphs Abs: 1.3 10*3/uL (ref 0.7–4.0)
MCH: 29.9 pg (ref 26.0–34.0)
MCHC: 33.1 g/dL (ref 30.0–36.0)
MCV: 90.1 fL (ref 80.0–100.0)
Monocytes Absolute: 0.5 10*3/uL (ref 0.1–1.0)
Monocytes Relative: 9 %
Neutro Abs: 3.3 10*3/uL (ref 1.7–7.7)
Neutrophils Relative %: 63 %
Platelets: 189 10*3/uL (ref 150–400)
RBC: 3.85 MIL/uL — ABNORMAL LOW (ref 4.22–5.81)
RDW: 13.5 % (ref 11.5–15.5)
WBC: 5.3 10*3/uL (ref 4.0–10.5)
nRBC: 0 % (ref 0.0–0.2)

## 2021-05-27 LAB — COMPREHENSIVE METABOLIC PANEL
ALT: 35 U/L (ref 0–44)
AST: 28 U/L (ref 15–41)
Albumin: 4 g/dL (ref 3.5–5.0)
Alkaline Phosphatase: 90 U/L (ref 38–126)
Anion gap: 7 (ref 5–15)
BUN: 28 mg/dL — ABNORMAL HIGH (ref 8–23)
CO2: 24 mmol/L (ref 22–32)
Calcium: 9.1 mg/dL (ref 8.9–10.3)
Chloride: 106 mmol/L (ref 98–111)
Creatinine, Ser: 1.38 mg/dL — ABNORMAL HIGH (ref 0.61–1.24)
GFR, Estimated: 58 mL/min — ABNORMAL LOW (ref >60)
Glucose, Bld: 160 mg/dL — ABNORMAL HIGH (ref 70–99)
Potassium: 3.7 mmol/L (ref 3.5–5.1)
Sodium: 137 mmol/L (ref 135–145)
Total Bilirubin: 0.4 mg/dL (ref 0.3–1.2)
Total Protein: 7.2 g/dL (ref 6.5–8.1)

## 2021-05-27 LAB — BRAIN NATRIURETIC PEPTIDE: B Natriuretic Peptide: 5.8 pg/mL (ref 0.0–100.0)

## 2021-05-27 MED ORDER — HEPARIN SOD (PORK) LOCK FLUSH 100 UNIT/ML IV SOLN
500.0000 [IU] | Freq: Once | INTRAVENOUS | Status: AC
  Administered 2021-05-27: 500 [IU] via INTRAVENOUS
  Filled 2021-05-27: qty 5

## 2021-05-27 MED ORDER — SODIUM CHLORIDE 0.9% FLUSH
10.0000 mL | INTRAVENOUS | Status: DC | PRN
  Filled 2021-05-27: qty 10

## 2021-05-27 NOTE — Progress Notes (Signed)
No treatment today per Dr. Eustace Quail a cath deaccessed. ?

## 2021-05-27 NOTE — Progress Notes (Signed)
Patient here for follow up. Patient complains of SOB and leg swelling ever since he started his new medication. ?

## 2021-05-27 NOTE — Progress Notes (Signed)
Hematology/Oncology Progress note [phone] [phone]      Patient Care Team: Mechele Claude, FNP as PCP - General (Family Medicine) [name], MD as Consulting Physician (Oncology) Mickeal Skinner Acey Lav, MD as Consulting Physician (Oncology) [name], MD as Consulting Physician (Cardiology)  REFERRING PROVIDER: Mechele Claude, FNP  CHIEF COMPLAINTS/REASON FOR VISIT:  Follow up for melanoma HISTORY OF PRESENTING ILLNESS:   Edward Pitts is a  61 y.o.  male with PMH listed below was seen in consultation at the request of  Mechele Claude, FNP  for evaluation of inguinal mass Patient presented to emergency room 3 days ago complaining about left ing uinal mass discomfort. Reports that he has really noticed the mass growing for the past 1 months. He has a history of left lower extremity melanoma in 2011, status post local excision.  Pain was increased with squatting of laxation. He was advised to take Tylenol for pain. Denies any fever, chills, night sweating.  He does feel mild nauseated. Appetite is fair.  He has lost about 10 pounds since earlier this year. 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  Left inguinal and left iliac nodes which are suspicious for involvement.  There are also 2 small nonspecific hypodense lesions within the right liver, nonspecific.  # patient underwent left groin mass resection on 04/16/2019. Resection pathology showed malignant melanoma, replacing a lymph node, with extracapsular extension, peripheral and deep margins involved.  Left inguinal contents, all 7 lymph nodes were negative for melanoma in the lymph nodes.  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  # PDL1 80% TPS  # 04/16/2019. underwent left groin mass resection   07/07/2019  Status post adjuvant radiation and finished  radiation  Patient has Mediport placed to facilitate immunotherapy treatments.  06/29/2020, CT chest abdomen pelvis showed stable postoperative appearance of the left groin.  No evidence of local recurrence.  No evidence of metastatic disease in the chest abdomen or pelvis.  Hepatic steatosis.  Stable subcentimeter fluid attenuation lesion of the lateral right lobe of the liver, likely benign cyst or hemangioma.  Coronary artery disease.  Aortic atherosclerosis  10/20/2020, CT chest abdomen pelvis showed stable postoperative/radiation appearance of the left groin.  No evidence of local recurrence/metastatic disease within the chest abdomen/pelvis.  Fatty liver disease.  Diverticulosis without evidence of typhlitis.  Aortic atherosclerosis  03/10/2021 MRI brain without contrast showed no definitive evidence of intracranial metastatic disease.  Study is limited by absence of intravenous contrast.  04/18/2021, patient establish care with neurology Dr. Mickeal Skinner for intermittent altered cognition.  he was recommended to start Vimpat.    04/26/2021, CT chest abdomen pelvis without contrast showed stable post operative changes of left groin with no evidence of recurrent disease.  No evidence of metastatic disease in the chest abdomen pelvis.  Aortic atherosclerosis.  INTERVAL HISTORY Edward Pitts is a 61 y.o. male who has above history reviewed by me today presents for follow up visit for management of inguinal nodal recurrence of melanoma Patient takes Vimpat for episodes of altered cognition. Patient reports worsening of shortness of breath and increase of lower extremity swelling.  His left lower extremity has chronic edema.  Right lower extremity has swelling, which improved in the morning and get worse with gravity   :Review of Systems  Constitutional:  Negative for appetite change, chills, fatigue, fever and unexpected  weight change.  HENT:   Negative for hearing loss and voice change.   Eyes:   Negative for eye problems and icterus.  Respiratory:  Positive for shortness of breath. Negative for chest tightness and cough.   Cardiovascular:  Positive for leg swelling. Negative for chest pain.  Gastrointestinal:  Negative for abdominal distention and abdominal pain.  Endocrine: Negative for hot flashes.  Genitourinary:  Negative for difficulty urinating, dysuria and frequency.   Musculoskeletal:        Status post left hip replacement  Skin:  Negative for itching and rash.       Skin hypo-pigmentation on upper extremities, no change  Neurological:  Negative for light-headedness and numbness.       Spells  Hematological:  Negative for adenopathy. Does not bruise/bleed easily.  Psychiatric/Behavioral:  Negative for confusion.    MEDICAL HISTORY:  Past Medical History:  Diagnosis Date   Anemia    iron treatments   Anxiety    Aortic atherosclerosis (HCC)    Arthritis    Cancer of groin ([place]) 2021   left groin, resected, radiation   Cataract    Complication of anesthesia    PONV   Coronary artery disease    Dizziness of unknown etiology    has led to seizures and passing out.   Family history of adverse reaction to anesthesia    PONV mother   GERD (gastroesophageal reflux disease)    History of complete heart block    PPM placed   Hyperlipidemia    Hypertension    LBBB (left bundle branch block)    Lymphedema of left leg    uses thigh high compression stockings   Melanoma ([place]) 2012   skin cancer, left thigh   OSA on CPAP    PONV (postoperative nausea and vomiting) 04/16/2019   Port-A-Cath in place    RIGHT chest wall   Presence of cardiac pacemaker    [employer]   Seizures ([place])    still has episodes of dizziness. last event 1 month ago (march 2022) and will pass out. takes clonazepam    SURGICAL HISTORY: Past Surgical History:  Procedure Laterality Date   CT RADIATION THERAPY GUIDE     left groin   dental implant     permanent implant   KNEE SURGERY Left     arthroscopy   LEFT HEART CATH AND CORONARY ANGIOGRAPHY Left 06/29/2017   Procedure: LEFT HEART CATH AND CORONARY ANGIOGRAPHY;  Surgeon: [name], MD;  Location: [facility];  Service: Cardiovascular;  Laterality: Left;   LYMPH NODE DISSECTION Left 04/16/2019   Procedure: Left inguinal Lymph Node Dissection;  Surgeon: [name], MD;  Location: [facility];  Service: General;  Laterality: Left;   MELANOMA EXCISION Left 04/16/2019   Procedure: MELANOMA EXCISION LEFT GROIN MASS;  Surgeon: [name], MD;  Location: [facility];  Service: General;  Laterality: Left;   MELANOMA EXCISION WITH SENTINEL LYMPH NODE BIOPSY Left 2012   Left calf    PACEMAKER INSERTION N/A 08/26/2018   Procedure: INSERTION PACEMAKER;  Surgeon: [name], MD;  Location: [facility];  Service: Cardiovascular;  Laterality: N/A;   PORTA CATH INSERTION N/A 08/26/2019   Procedure: PORTA CATH INSERTION;  Surgeon: Katha Cabal, MD;  Location: [facility];  Service: Cardiovascular;  Laterality: N/A;   SUPERFICIAL LYMPH NODE BIOPSY / EXCISION Left 2020   lymph nodes removed around left groin melanoma site   TEMPORARY PACEMAKER N/A 08/25/2018  Procedure: TEMPORARY PACEMAKER;  Surgeon: [name], MD;  Location: [facility];  Service: Cardiovascular;  Laterality: N/A;   TOTAL HIP ARTHROPLASTY Left 07/14/2020   Procedure: TOTAL HIP ARTHROPLASTY;  Surgeon: Dereck Leep, MD;  Location: [facility];  Service: Orthopedics;  Laterality: Left;    SOCIAL HISTORY: Social History   Socioeconomic History   Marital status: Married    Spouse name: Vicente Males    Number of children: 7   Years of education: 12   Highest education level: Not on file  Occupational History    Comment: disability  Tobacco Use   Smoking status: Never   Smokeless tobacco: Never  Vaping Use   Vaping Use: Never used  Substance and Sexual Activity   Alcohol use: No   Drug use: No   Sexual activity: Not Currently  Other  Topics Concern   Not on file  Social History Narrative   Lives with  Wife,   Has 2 small dogs   Caffeine use: sodas (2 per day)      Out of work on disability.  Has a walk in shower. No stairs to climb   Oncology treatment ongoing. Uses port a cath for treatment.      pacemaker   Social Determinants of Health   Financial Resource Strain: Not on file  Food Insecurity: Not on file  Transportation Needs: Not on file  Physical Activity: Not on file  Stress: Not on file  Social Connections: Not on file  Intimate Partner Violence: Not on file    FAMILY HISTORY: Family History  Problem Relation Age of Onset   Cancer Paternal Grandmother     ALLERGIES:  is allergic to ibuprofen and nsaids.  MEDICATIONS:  Current Outpatient Medications  Medication Sig Dispense Refill   allopurinol (ZYLOPRIM) 300 MG tablet Take by mouth.     ASPIRIN 81 PO Take 1 tablet by mouth daily.     atorvastatin (LIPITOR) 20 MG tablet Take 20 mg by mouth daily.     clonazePAM (KLONOPIN) 0.5 MG tablet 1 tablet in the morning, 2 in the evening 90 tablet 3   hydrocortisone 2.5 % ointment Apply topically 2 (two) times daily.     ketoconazole (NIZORAL) 2 % cream Apply 1 application topically daily.     Lacosamide (VIMPAT) 100 MG TABS Take 1 tablet (100 mg total) by mouth in the morning and at bedtime. 60 tablet 3   lidocaine-prilocaine (EMLA) cream Apply 1 application topically as needed. Apply small amount of cream to port site approx 1-2 hours prior to appointment. 30 g 2   lisinopril (ZESTRIL) 20 MG tablet Take 20 mg by mouth daily.     metoprolol succinate (TOPROL-XL) 25 MG 24 hr tablet Take 25 mg by mouth daily.      Multiple Vitamin (MULTIVITAMIN WITH MINERALS) TABS tablet Take 1 tablet by mouth daily. Centrum Silver     nystatin (MYCOSTATIN/NYSTOP) powder Apply 1 application topically 3 (three) times daily. Apply small amount to affected area 3 times a day until healed 60 g 2   omeprazole (PRILOSEC) 20 MG  capsule TAKE 1 CAPSULE BY MOUTH EVERY DAY 30 capsule 1   ondansetron (ZOFRAN) 4 MG tablet TAKE 1 TABLET BY MOUTH EVERY 8 HOURS AS NEEDED FOR NAUSEA AND VOMITING 90 tablet 1   oxyCODONE (OXY IR/ROXICODONE) 5 MG immediate release tablet Take 1 tablet (5 mg total) by mouth every 4 (four) hours as needed for moderate pain (pain score 4-6). (Patient not taking:  Reported on 11/30/2020) 30 tablet 0   No current facility-administered medications for this visit.     PHYSICAL EXAMINATION: ECOG PERFORMANCE STATUS: 1 - Symptomatic but completely ambulatory Vitals:   05/27/21 0855  BP: (!) 172/73  Pulse: 71  Temp: (!) 97 F (36.1 C)  SpO2: 95%   Filed Weights   05/27/21 0855  Weight: 252 lb (114.3 kg)    Physical Exam Constitutional:      General: He is not in acute distress.    Comments: Patient ambulates independently  HENT:     Head: Normocephalic and atraumatic.  Eyes:     General: No scleral icterus.    Pupils: Pupils are equal, round, and reactive to light.  Cardiovascular:     Rate and Rhythm: Normal rate and regular rhythm.     Heart sounds: Normal heart sounds.  Pulmonary:     Effort: Pulmonary effort is normal. No respiratory distress.     Breath sounds: No wheezing.  Abdominal:     General: Bowel sounds are normal. There is no distension.     Palpations: Abdomen is soft. There is no mass.     Tenderness: There is no abdominal tenderness.     Comments:    Musculoskeletal:        General: No deformity. Normal range of motion.     Cervical back: Normal range of motion and neck supple.     Comments: Left lower extremity edema-chronic Right ankle edema 1+ Left hip replacement  Skin:    General: Skin is warm and dry.  Neurological:     Mental Status: He is alert and oriented to person, place, and time. Mental status is at baseline.     Cranial Nerves: No cranial nerve deficit.     Coordination: Coordination normal.  Psychiatric:        Mood and Affect: Mood normal.     LABORATORY DATA:  I have reviewed the data as listed Lab Results  Component Value Date   WBC 5.3 05/27/2021   HGB 11.5 (L) 05/27/2021   HCT 34.7 (L) 05/27/2021   MCV 90.1 05/27/2021   PLT 189 05/27/2021   Recent Labs    04/29/21 0758 05/13/21 0759 05/27/21 0835  NA 136 137 137  K 3.7 3.5 3.7  CL 106 104 106  CO2 _0 GLUCOSE 160* 142* 160*  BUN 22 17 28*  CREATININE 1.17 1.15 1.38*  CALCIUM 9.1 8.8* 9.1  GFRNONAA >60 >60 58*  PROT 7.5 6.8 7.2  ALBUMIN 4.2 3.8 4.0  AST 30 32 28  ALT 37 49* 35  ALKPHOS 100 88 90  BILITOT 0.2* 0.4 0.4    Iron/TIBC/Ferritin/ %Sat    Component Value Date/Time   IRON 85 10/02/2019 0841   TIBC 342 10/02/2019 0841   FERRITIN 61 10/02/2019 0841   IRONPCTSAT 25 10/02/2019 0841      RADIOGRAPHIC STUDIES: I have personally reviewed the radiological images as listed and agreed with the findings in the report. MR Brain Wo Contrast  Result Date: 03/10/2021 CLINICAL DATA:  Malignant melanoma of overlapping sites. Skin cancer, monitor; Melanoma. EXAM: MRI HEAD WITHOUT CONTRAST TECHNIQUE: Multiplanar, multiecho pulse sequences of the brain and surrounding structures were obtained without intravenous contrast. COMPARISON:  MRI of the brain March 10, 2019. FINDINGS: Brain: No acute infarction, hemorrhage, hydrocephalus, extra-axial collection or mass lesion. Minimal amount of punctate foci T2 hyperintensity within the white matter of the cerebral hemispheres nonspecific, may represent early chronic microangiopathy. Vascular:  Normal flow voids. Skull and upper cervical spine: Normal marrow signal. Sinuses/Orbits: Mucosal thickening of the right maxillary sinus. The orbits are. Other: Right mastoid effusion. IMPRESSION: No definitive evidence of intracranial metastatic disease. However, the study is limited by absence of intravenous contrast. Small lesions could be missed. Electronically Signed   By: Pedro Earls M.D.   On:  03/10/2021 12:08   CT CHEST ABDOMEN PELVIS WO CONTRAST  Result Date: 04/27/2021 CLINICAL DATA:  Melanoma follow-up EXAM: CT CHEST, ABDOMEN AND PELVIS WITHOUT CONTRAST TECHNIQUE: Multidetector CT imaging of the chest, abdomen and pelvis was performed following the standard protocol without IV contrast. RADIATION DOSE REDUCTION: This exam was performed according to the departmental dose-optimization program which includes automated exposure control, adjustment of the mA and/or kV according to patient size and/or use of iterative reconstruction technique. COMPARISON:  CT chest, abdomen and pelvis dated January 18, 2021 FINDINGS: CT CHEST FINDINGS Cardiovascular: Normal heart size. Left chest wall cardiac conduction device with leads in the right atrium and right ventricle. No pericardial effusion. Mild calcified plaque of the thoracic aorta. Coronary artery calcifications of the LAD. Right chest wall port with tip near the superior cavoatrial junction. Mediastinum/Nodes: Esophagus and thyroid are unremarkable. No pathologically enlarged lymph nodes seen in the chest. Lungs/Pleura: Central airways are patent. Mild bibasilar atelectasis. No consolidation, pleural effusion or pneumothorax. Musculoskeletal: No chest wall mass or suspicious bone lesions identified. CT ABDOMEN PELVIS FINDINGS Hepatobiliary: Unchanged tiny low-attenuation lesion of the right lobe of the liver located on image 51, likely a simple cyst. No suspicious liver lesions. Gallbladder is unremarkable. No biliary ductal dilation. Pancreas: Unremarkable. No pancreatic ductal dilatation or surrounding inflammatory changes. Spleen: Normal in size without focal abnormality. Adrenals/Urinary Tract: Bilateral adrenal glands are unremarkable. No hydronephrosis. Unchanged renal sinus cyst the lower pole of the left kidney. No nephrolithiasis. Bladder is unremarkable but incompletely visualized due to streak artifact. Stomach/Bowel: Stomach is within normal  limits. No evidence of bowel wall thickening, distention, or inflammatory changes. Vascular/Lymphatic: Aortic atherosclerosis. No enlarged abdominal or pelvic lymph nodes. Reproductive: Prostate is unremarkable. Other: Unchanged moderate fat containing umbilical hernia. Stable postsurgical changes of the left groin. No abdominopelvic ascites. Musculoskeletal: Prior left total hip arthroplasty. No aggressive appearing osseous lesions. IMPRESSION: 1. Stable postoperative changes of the left groin with no evidence of recurrent disease. 2. No evidence of metastatic disease in the chest, abdomen or pelvis. 3. Aortic Atherosclerosis (ICD10-I70.0). Electronically Signed   By: Yetta Glassman M.D.   On: 04/27/2021 13:16        ASSESSMENT & PLAN:  1. SOB (shortness of breath)   2. Malignant melanoma of overlapping sites ([place])   3. Stage 3a chronic kidney disease (HCC)   4. Spell of dizziness    #Left inguinal nodal recurrence of melanoma.  Status post resection and adjuvant radiation. Stage IV Recurrent, now in NED Hold off treatment due to acute new symptoms.  #Shortness of breath, unknown etiology.  Check checks x-ray for evaluation for possible immunotherapy induced lung toxicities. New onset of right LE swelling along with sob. Check BNP.- normal as well.  He knows to go to ER if symptoms get worse.   Chronic kidney disease, avoid nephrotoxins.  Encourage oral hydration.  Kidney function is stable.  #Vitiligo, stable #Normocytic anemia, hemoglobin is at stable.  #Intermittent altered cognition. On Vimpat  he will follow-up with neurology.   Follow-up TBD We spent sufficient time to discuss many aspect of care, questions were answered to  patient's satisfaction.   [name], MD, PhD  05/27/2021

## 2021-05-30 ENCOUNTER — Telehealth: Payer: Self-pay

## 2021-05-30 ENCOUNTER — Ambulatory Visit: Admission: RE | Admit: 2021-05-30 | Payer: Medicaid Other | Source: Ambulatory Visit

## 2021-05-30 ENCOUNTER — Encounter: Payer: Self-pay | Admitting: Oncology

## 2021-05-30 DIAGNOSIS — R0602 Shortness of breath: Secondary | ICD-10-CM

## 2021-05-30 NOTE — Telephone Encounter (Signed)
Called patient to inform of CXR and lab results that were normal. Per DR. Wanting STAT bilateral lower extremity US to rule out DVT. No answer, left detailed message of plans per Dr. Tasia Catchings. Advised to if still experiencing SOB, patient needs to go to ER.  ? ? ?Ladies, please schedule patient for STAT bilateral lower extremity US to rule out DVT. Please notify patient of [place] appt. Thanks  ?

## 2021-05-30 NOTE — Telephone Encounter (Signed)
-----   Message from [name], MD sent at 05/29/2021  3:56 PM EST ----- ?Let him know that his CXR result is good, additional blood test is normal, this is not explaining his SOB.  I called him on 05/29/21 not able to reach him.  ?I recommend STAT bilateral lower extremity US to rule out DVT. If his SOB is worse, he should go to ER.  ?Thanks.  ?

## 2021-05-31 ENCOUNTER — Ambulatory Visit
Admission: RE | Admit: 2021-05-31 | Discharge: 2021-05-31 | Disposition: A | Discharge status: Home / Self Care | Payer: Medicaid Other | Source: Ambulatory Visit | Attending: Oncology | Admitting: Oncology

## 2021-05-31 ENCOUNTER — Encounter: Payer: Self-pay | Admitting: Oncology

## 2021-05-31 DIAGNOSIS — R0602 Shortness of breath: Secondary | ICD-10-CM

## 2021-05-31 NOTE — Telephone Encounter (Signed)
Spoke to pt and informed him of [place]. Pt verbalized understanding.  ?

## 2021-06-04 ENCOUNTER — Other Ambulatory Visit: Payer: Self-pay | Admitting: Oncology

## 2021-06-06 ENCOUNTER — Telehealth: Payer: Self-pay

## 2021-06-06 NOTE — Telephone Encounter (Signed)
Unable to reach pt by phone. Detailed VM left and mychart message sent.  ? ?Please schedule patient for:  ? lab/ NP / nivolumab on 3/17  ?Lab/MD/ nivolumab on 3/31  ? ?Please inform pt of appt details. Thanks  ? ?

## 2021-06-06 NOTE — Telephone Encounter (Signed)
My chart message sent as well

## 2021-06-06 NOTE — Telephone Encounter (Signed)
-----   Message from [name], MD sent at 06/04/2021  4:38 PM EST ----- ?Negative ultrasound.  ?Please schedule him to see NP of lab MD nivolumab on 3/17 ?Lab md nivolumab on 3/31 thanks.  ?

## 2021-06-06 NOTE — Telephone Encounter (Signed)
LVM again for pt about his appt on Thursday ? ?

## 2021-06-06 NOTE — Telephone Encounter (Signed)
What about thursday? If so, then schedule will need to be adjusted for him to see MD 2 weeks after that date.  ?

## 2021-06-06 NOTE — Telephone Encounter (Signed)
LVM but planned on calling him back  ?

## 2021-06-09 ENCOUNTER — Encounter: Payer: Self-pay | Admitting: Nurse Practitioner

## 2021-06-09 ENCOUNTER — Inpatient Hospital Stay: Payer: Medicaid Other

## 2021-06-09 ENCOUNTER — Other Ambulatory Visit: Payer: Self-pay | Admitting: Neurology

## 2021-06-09 ENCOUNTER — Inpatient Hospital Stay: Payer: Medicaid Other | Admitting: Nurse Practitioner

## 2021-06-09 ENCOUNTER — Inpatient Hospital Stay (HOSPITAL_BASED_OUTPATIENT_CLINIC_OR_DEPARTMENT_OTHER): Payer: Medicaid Other | Admitting: Nurse Practitioner

## 2021-06-09 ENCOUNTER — Other Ambulatory Visit: Payer: Self-pay

## 2021-06-09 VITALS — BP 131/72 | HR 80 | Temp 97.6°F | Resp 18 | Wt 248.5 lb

## 2021-06-09 DIAGNOSIS — C438 Malignant melanoma of overlapping sites of skin: Secondary | ICD-10-CM | POA: Diagnosis not present

## 2021-06-09 DIAGNOSIS — Z5112 Encounter for antineoplastic immunotherapy: Secondary | ICD-10-CM | POA: Diagnosis not present

## 2021-06-09 DIAGNOSIS — R4189 Other symptoms and signs involving cognitive functions and awareness: Secondary | ICD-10-CM | POA: Diagnosis not present

## 2021-06-09 LAB — COMPREHENSIVE METABOLIC PANEL
ALT: 37 U/L (ref 0–44)
AST: 30 U/L (ref 15–41)
Albumin: 4 g/dL (ref 3.5–5.0)
Alkaline Phosphatase: 100 U/L (ref 38–126)
Anion gap: 8 (ref 5–15)
BUN: 23 mg/dL (ref 8–23)
CO2: 26 mmol/L (ref 22–32)
Calcium: 9.1 mg/dL (ref 8.9–10.3)
Chloride: 103 mmol/L (ref 98–111)
Creatinine, Ser: 1.17 mg/dL (ref 0.61–1.24)
GFR, Estimated: >60 mL/min (ref >60)
Glucose, Bld: 148 mg/dL — ABNORMAL HIGH (ref 70–99)
Potassium: 3.9 mmol/L (ref 3.5–5.1)
Sodium: 137 mmol/L (ref 135–145)
Total Bilirubin: 0.5 mg/dL (ref 0.3–1.2)
Total Protein: 7.2 g/dL (ref 6.5–8.1)

## 2021-06-09 LAB — CBC WITH DIFFERENTIAL/PLATELET
Abs Immature Granulocytes: 0.05 10*3/uL (ref 0.00–0.07)
Basophils Absolute: 0 10*3/uL (ref 0.0–0.1)
Basophils Relative: 0 %
Eosinophils Absolute: 0.2 10*3/uL (ref 0.0–0.5)
Eosinophils Relative: 5 %
HCT: 35.5 % — ABNORMAL LOW (ref 39.0–52.0)
Hemoglobin: 11.8 g/dL — ABNORMAL LOW (ref 13.0–17.0)
Immature Granulocytes: 1 %
Lymphocytes Relative: 27 %
Lymphs Abs: 1.3 10*3/uL (ref 0.7–4.0)
MCH: 29.7 pg (ref 26.0–34.0)
MCHC: 33.2 g/dL (ref 30.0–36.0)
MCV: 89.4 fL (ref 80.0–100.0)
Monocytes Absolute: 0.4 10*3/uL (ref 0.1–1.0)
Monocytes Relative: 7 %
Neutro Abs: 3 10*3/uL (ref 1.7–7.7)
Neutrophils Relative %: 60 %
Platelets: 189 10*3/uL (ref 150–400)
RBC: 3.97 MIL/uL — ABNORMAL LOW (ref 4.22–5.81)
RDW: 13.2 % (ref 11.5–15.5)
WBC: 5 10*3/uL (ref 4.0–10.5)
nRBC: 0 % (ref 0.0–0.2)

## 2021-06-09 LAB — T4, FREE: Free T4: 0.76 ng/dL (ref 0.61–1.12)

## 2021-06-09 LAB — TSH: TSH: 2.726 u[IU]/mL (ref 0.350–4.500)

## 2021-06-09 MED ORDER — SODIUM CHLORIDE 0.9 % IV SOLN
240.0000 mg | Freq: Once | INTRAVENOUS | Status: AC
  Administered 2021-06-09: 240 mg via INTRAVENOUS
  Filled 2021-06-09: qty 24

## 2021-06-09 MED ORDER — HEPARIN SOD (PORK) LOCK FLUSH 100 UNIT/ML IV SOLN
INTRAVENOUS | Status: AC
  Administered 2021-06-09: 500 [IU]
  Filled 2021-06-09: qty 5

## 2021-06-09 MED ORDER — SODIUM CHLORIDE 0.9 % IV SOLN
Freq: Once | INTRAVENOUS | Status: AC
  Filled 2021-06-09: qty 250

## 2021-06-09 MED ORDER — HEPARIN SOD (PORK) LOCK FLUSH 100 UNIT/ML IV SOLN
500.0000 [IU] | Freq: Once | INTRAVENOUS | Status: AC | PRN
  Filled 2021-06-09: qty 5

## 2021-06-09 MED ORDER — CLONAZEPAM 0.5 MG PO TABS: ORAL_TABLET | ORAL | 0 refills | Status: DC

## 2021-06-09 MED ORDER — SODIUM CHLORIDE 0.9% FLUSH
10.0000 mL | INTRAVENOUS | Status: DC | PRN
  Filled 2021-06-09: qty 10

## 2021-06-09 NOTE — Progress Notes (Signed)
Patient here for follow up. Pt reports that he had a seizure last night therefore he is tired. NO further symptoms today.   ?

## 2021-06-09 NOTE — Patient Instructions (Signed)
[facility] [place]  Discharge Instructions: ?Thank you for choosing [facility] to provide your oncology and hematology care.  ?If you have a lab appointment with the [facility], please go directly to the [facility] and check in at the registration area. ? ?Wear comfortable clothing and clothing appropriate for easy access to any Portacath or PICC line.  ? ?We strive to give you quality time with your provider. You may need to reschedule your appointment if you arrive late (15 or more minutes).  Arriving late affects you and other patients whose appointments are after yours.  Also, if you miss three or more appointments without notifying the office, you may be dismissed from the clinic at the provider?s discretion.    ?  ?For prescription refill requests, have your pharmacy contact our office and allow 72 hours for refills to be completed.   ? ?Today you received the following chemotherapy and/or immunotherapy agents - nivolumab    ?  ?To help prevent nausea and vomiting after your treatment, we encourage you to take your nausea medication as directed. ? ?BELOW ARE SYMPTOMS THAT SHOULD BE REPORTED IMMEDIATELY: ?*FEVER GREATER THAN 100.4 F (38 ?C) OR HIGHER ?*CHILLS OR SWEATING ?*NAUSEA AND VOMITING THAT IS NOT CONTROLLED WITH YOUR NAUSEA MEDICATION ?*UNUSUAL SHORTNESS OF BREATH ?*UNUSUAL BRUISING OR BLEEDING ?*URINARY PROBLEMS (pain or burning when urinating, or frequent urination) ?*BOWEL PROBLEMS (unusual diarrhea, constipation, pain near the anus) ?TENDERNESS IN MOUTH AND THROAT WITH OR WITHOUT PRESENCE OF ULCERS (sore throat, sores in mouth, or a toothache) ?UNUSUAL RASH, SWELLING OR PAIN  ?UNUSUAL VAGINAL DISCHARGE OR ITCHING  ? ?Items with * indicate a potential emergency and should be followed up as soon as possible or go to the Emergency Department if any problems should occur. ? ?Please show the CHEMOTHERAPY ALERT CARD or IMMUNOTHERAPY ALERT CARD at check-in to  the Emergency Department and triage nurse. ? ?Should you have questions after your visit or need to cancel or reschedule your appointment, please contact [facility] [place] [facility] [place]  [phone] and follow the prompts.  Office hours are 8:00 a.m. to 4:30 p.m. Monday - Friday. Please note that voicemails left after 4:00 p.m. may not be returned until the following business day.  We are closed weekends and major holidays. You have access to a nurse at all times for urgent questions. Please call the main number to the clinic [phone] and follow the prompts. ? ?For any non-urgent questions, you may also contact your provider using MyChart. We now offer e-Visits for anyone 47 and older to request care online for non-urgent symptoms. For details visit [email or web address]. ?  ?Also download the MyChart app! Go to the app store, search "MyChart", open the app, select [place], and log in with your MyChart username and password. ? ?Due to Covid, a mask is required upon entering the hospital/clinic. If you do not have a mask, one will be given to you upon arrival. For doctor visits, patients may have 1 support person aged 54 or older with them. For treatment visits, patients cannot have anyone with them due to current Covid guidelines and our immunocompromised population.  ? ?Nivolumab injection ?What is this medication? ?NIVOLUMAB (nye VOL ue mab) is a monoclonal antibody. It treats certain types of cancer. Some of the cancers treated are colon cancer, head and neck cancer, Hodgkin lymphoma, lung cancer, and melanoma. ?This medicine may be used for other purposes; ask your health care provider or pharmacist  if you have questions. ?COMMON BRAND NAME(S): Opdivo ?What should I tell my care team before I take this medication? ?They need to know if you have any of these conditions: ?Autoimmune diseases such as Crohn's disease, ulcerative colitis, or lupus ?Have had or planning to have an allogeneic  stem cell transplant (uses someone else's stem cells) ?History of chest radiation ?Organ transplant ?Nervous system problems such as myasthenia gravis or Guillain-Barre syndrome ?An unusual or allergic reaction to nivolumab, other medicines, foods, dyes, or preservatives ?Pregnant or trying to get pregnant ?Breast-feeding ?How should I use this medication? ?This medication is injected into a vein. It is given in a hospital or clinic setting. ?A special MedGuide will be given to you before each treatment. Be sure to read this information carefully each time. ?Talk to your care team regarding the use of this medication in children. While it may be prescribed for children as young as 12 years for selected conditions, precautions do apply. ?Overdosage: If you think you have taken too much of this medicine contact a poison control center or emergency room at once. ?NOTE: This medicine is only for you. Do not share this medicine with others. ?What if I miss a dose? ?Keep appointments for follow-up doses. It is important not to miss your dose. Call your care team if you are unable to keep an appointment. ?What may interact with this medication? ?Interactions have not been studied. ?This list may not describe all possible interactions. Give your health care provider a list of all the medicines, herbs, non-prescription drugs, or dietary supplements you use. Also tell them if you smoke, drink alcohol, or use illegal drugs. Some items may interact with your medicine. ?What should I watch for while using this medication? ?Your condition will be monitored carefully while you are receiving this medication. ?You may need blood work done while you are taking this medication. ?Do not become pregnant while taking this medication or for 5 months after stopping it. Women should inform their care team if they wish to become pregnant or think they might be pregnant. There is a potential for serious harm to an unborn child. Talk to your  care team for more information. Do not breast-feed an infant while taking this medication or for 5 months after stopping it. ?What side effects may I notice from receiving this medication? ?Side effects that you should report to your care team as soon as possible: ?Allergic reactions--skin rash, itching, hives, swelling of the face, lips, tongue, or throat ?Bloody or black, tar-like stools ?Change in vision ?Chest pain ?Diarrhea ?Dry cough, shortness of breath or trouble breathing ?Eye pain ?Fast or irregular heartbeat ?Fever, chills ?High blood sugar (hyperglycemia)--increased thirst or amount of urine, unusual weakness or fatigue, blurry vision ?High thyroid levels (hyperthyroidism)--fast or irregular heartbeat, weight loss, excessive sweating or sensitivity to heat, tremors or shaking, anxiety, nervousness, irregular menstrual cycle or spotting ?Kidney injury--decrease in the amount of urine, swelling of the ankles, hands, or feet ?Liver injury--right upper belly pain, loss of appetite, nausea, light-colored stool, dark yellow or brown urine, yellowing skin or eyes, unusual weakness or fatigue ?Low red blood cell count--unusual weakness or fatigue, dizziness, headache, trouble breathing ?Low thyroid levels (hypothyroidism)--unusual weakness or fatigue, increased sensitivity to cold, constipation, hair loss, dry skin, weight gain, feelings of depression ?Mood and behavior changes-confusion, change in sex drive or performance, irritability ?Muscle pain or cramps ?Pain, tingling, or numbness in the hands or feet, muscle weakness, trouble walking, loss of balance or  coordination ?Red or dark brown urine ?Redness, blistering, peeling, or loosening of the skin, including inside the mouth ?Stomach pain ?Unusual bruising or bleeding ?Side effects that usually do not require medical attention (report to your care team if they continue or are bothersome): ?Bone pain ?Constipation ?Loss of  appetite ?Nausea ?Tiredness ?Vomiting ?This list may not describe all possible side effects. Call your doctor for medical advice about side effects. You may report side effects to FDA at 1-800-FDA-1088. ?Where should I keep my medicatio

## 2021-06-09 NOTE — Telephone Encounter (Signed)
Last seen by Dr. Jannifer Franklin 02/22/1961. He reassigned him to Dr. April Manson. Pending appt w/ Judson Roch 06/21/21. [place] narcotic registry checked. Clonazepam last filled 04/28/21 for #90. ?

## 2021-06-09 NOTE — Telephone Encounter (Signed)
I will refill until his next appointment then we can discuss the necessity of the Klonopin going forward.  ?

## 2021-06-09 NOTE — Progress Notes (Signed)
?Hematology/Oncology Progress Note ?[phone] [phone] ?  ? ?Patient Care Team: ?Mechele Claude, FNP as PCP - General (Family Medicine) ?[name], MD as Consulting Physician (Oncology) ?Ventura Sellers, MD as Consulting Physician (Oncology) ?[name], MD as Consulting Physician (Cardiology) ? ?REFERRING PROVIDER: ?Mechele Claude, FNP  ? ?CHIEF COMPLAINTS/REASON FOR VISIT:  ?Follow up for melanoma ? ?HISTORY OF PRESENTING ILLNESS: Edward Pitts is a  61 y.o.  male with PMH listed below was seen in consultation at the request of  Mechele Claude, FNP  for evaluation of inguinal mass ?Patient presented to emergency room 3 days ago complaining about left ing uinal mass discomfort. ?Reports that he has really noticed the mass growing for the past 1 months. ?He has a history of left lower extremity melanoma in 2011, status post local excision.  Pain was increased with squatting of laxation. ?He was advised to take Tylenol for pain. ?Denies any fever, chills, night sweating.  He does feel mild nauseated. ?Appetite is fair.  He has lost about 10 pounds since earlier this year. ?In the emergency room CT scan was done which showed left inguinal mass with diabetes as large as 11.6 cm.  Left inguinal and left iliac nodes which are suspicious for involvement.  There are also 2 small nonspecific hypodense lesions within the right liver, nonspecific. ? ?# patient underwent left groin mass resection on 04/16/2019. ?Resection pathology showed malignant melanoma, replacing a lymph node, with extracapsular extension, peripheral and deep margins involved.  Left inguinal contents, all 7 lymph nodes were negative for melanoma in the lymph nodes.  Extranodal melanoma identified in lymphatic and interstitium between nodes ?#07/07/2019, status post adjuvant radiation. ? ?# PDL1 80% TPS  ?# 04/16/2019. underwent left groin mass resection  ? 07/07/2019  Status post adjuvant radiation and finished  radiation  ?Patient has Mediport placed to facilitate immunotherapy treatments. ? ?06/29/2020, CT chest abdomen pelvis showed stable postoperative appearance of the left groin.  No evidence of local recurrence.  No evidence of metastatic disease in the chest abdomen or pelvis.  Hepatic steatosis.  Stable subcentimeter fluid attenuation lesion of the lateral right lobe of the liver, likely benign cyst or hemangioma.  Coronary artery disease.  Aortic atherosclerosis ? ?10/20/2020, CT chest abdomen pelvis showed stable postoperative/radiation appearance of the left groin.  No evidence of local recurrence/metastatic disease within the chest abdomen/pelvis.  Fatty liver disease.  Diverticulosis without evidence of typhlitis.  Aortic atherosclerosis ? ?03/10/2021 MRI brain without contrast showed no definitive evidence of intracranial metastatic disease.  Study is limited by absence of intravenous contrast. ? ?04/18/2021, patient establish care with neurology Dr. Mickeal Skinner for intermittent altered cognition.  he was recommended to start Vimpat.   ? ?04/26/2021, CT chest abdomen pelvis without contrast showed stable post operative changes of left groin with no evidence of recurrent disease.  No evidence of metastatic disease in the chest abdomen pelvis.  Aortic atherosclerosis. ? ?INTERVAL HISTORY ?Edward Pitts is a 61 y.o. male who has above history reviewed by me today presents for follow up visit for management of inguinal nodal recurrence of melanoma. Continues vimpat for episodes of altered cognition. Has not been journaling episodes. Will see Dr. Mickeal Skinner tomorrow. Shortness of breath and leg swelling has resolved. He feels back to baseline. Had some changes in cardiac medications which have now been changed. He denies complaints today.  ? ? ?:Review of Systems  ?Constitutional:  Negative for appetite change, chills, fatigue, fever and unexpected weight  change.  ?HENT:   Negative for hearing loss and voice change.   ?Eyes:   Negative for eye problems and icterus.  ?Respiratory:  Negative for chest tightness, cough and shortness of breath.   ?Cardiovascular:  Positive for leg swelling (left. chronic.). Negative for chest pain.  ?Gastrointestinal:  Negative for abdominal distention, abdominal pain, constipation and diarrhea.  ?Endocrine: Negative for hot flashes.  ?Genitourinary:  Negative for difficulty urinating, dysuria and frequency.   ?Musculoskeletal:  Positive for arthralgias. Negative for myalgias.  ?     Status post left hip replacement  ?Skin:  Negative for itching and rash.  ?     Skin hypo-pigmentation on upper extremities, no change  ?Neurological:  Negative for light-headedness and numbness.  ?     Spells  ?Hematological:  Negative for adenopathy. Does not bruise/bleed easily.  ?Psychiatric/Behavioral:  Negative for confusion and depression.   ? ?MEDICAL HISTORY:  ?Past Medical History:  ?Diagnosis Date  ? Anemia   ? iron treatments  ? Anxiety   ? Aortic atherosclerosis ([place])   ? Arthritis   ? 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 left groin, resected, radiation  ? Cataract   ? Complication of anesthesia   ? PONV  ? Coronary artery disease   ? Dizziness of unknown etiology   ? has led to seizures and passing out.  ? Family history of adverse reaction to anesthesia   ? PONV mother  ? GERD (gastroesophageal reflux disease)   ? History of complete heart block   ? PPM placed  ? Hyperlipidemia   ? Hypertension   ? LBBB (left bundle branch block)   ? Lymphedema of left leg   ? uses thigh high compression stockings  ? Melanoma ([place]) 2012  ? skin cancer, left thigh  ? OSA on CPAP   ? PONV (postoperative nausea and vomiting) 04/16/2019  ? Port-A-Cath in place   ? RIGHT chest wall  ? Presence of cardiac pacemaker   ? [employer]  ? Seizures ([place])   ? still has episodes of dizziness. last event 1 month ago (march 2022) and will pass out. takes clonazepam  ? ? ?SURGICAL HISTORY: ?Past Surgical History:  ?Procedure Laterality Date  ? CT RADIATION  THERAPY GUIDE    ? left groin  ? dental implant    ? permanent implant  ? KNEE SURGERY Left   ? arthroscopy  ? LEFT HEART CATH AND CORONARY ANGIOGRAPHY Left 06/29/2017  ? Procedure: LEFT HEART CATH AND CORONARY ANGIOGRAPHY;  Surgeon: [name], MD;  Location: [place] [facility];  Service: Cardiovascular;  Laterality: Left;  ? LYMPH NODE DISSECTION Left 04/16/2019  ? Procedure: Left inguinal Lymph Node Dissection;  Surgeon: [name], MD;  Location: [place];  Service: General;  Laterality: Left;  ? MELANOMA EXCISION Left 04/16/2019  ? Procedure: MELANOMA EXCISION LEFT GROIN MASS;  Surgeon: [name], MD;  Location: [facility] [place];  Service: General;  Laterality: Left;  ? MELANOMA EXCISION WITH SENTINEL LYMPH NODE BIOPSY Left 2012  ? Left calf   ? PACEMAKER INSERTION N/A 08/26/2018  ? Procedure: INSERTION PACEMAKER;  Surgeon: [name], MD;  Location: [facility];  Service: Cardiovascular;  Laterality: N/A;  ? PORTA CATH INSERTION N/A 08/26/2019  ? Procedure: PORTA CATH INSERTION;  Surgeon: Katha Cabal, MD;  Location: [place] [facility];  Service: Cardiovascular;  Laterality: N/A;  ? SUPERFICIAL LYMPH NODE BIOPSY / EXCISION Left 2020  ? lymph nodes removed around left groin melanoma  site  ? TEMPORARY PACEMAKER N/A 08/25/2018  ? Procedure: TEMPORARY PACEMAKER;  Surgeon: [name], MD;  Location: [place] [facility];  Service: Cardiovascular;  Laterality: N/A;  ? TOTAL HIP ARTHROPLASTY Left 07/14/2020  ? Procedure: TOTAL HIP ARTHROPLASTY;  Surgeon: Dereck Leep, MD;  Location: [facility];  Service: Orthopedics;  Laterality: Left;  ? ? ?SOCIAL HISTORY: ?Social History  ? ?Socioeconomic History  ? Marital status: Married  ?  Spouse name: Vicente Males   ? Number of children: 7  ? Years of education: 31  ? Highest education level: Not on file  ?Occupational History  ?  Comment: disability  ?Tobacco Use  ? Smoking status: Never  ? Smokeless tobacco: Never  ?Vaping Use  ? Vaping Use: Never used  ?Substance and  Sexual Activity  ? Alcohol use: No  ? Drug use: No  ? Sexual activity: Not Currently  ?Other Topics Concern  ? Not on file  ?Social History Narrative  ? Lives with  Wife,  ? Has 2 small dogs  ? Caffeine use: s

## 2021-06-09 NOTE — Telephone Encounter (Signed)
Pt called needing a refill request for his clonazePAM (KLONOPIN) 0.5 MG tablet sent to the [facility] in [place] ?

## 2021-06-10 ENCOUNTER — Inpatient Hospital Stay (HOSPITAL_BASED_OUTPATIENT_CLINIC_OR_DEPARTMENT_OTHER): Payer: Medicaid Other | Admitting: Internal Medicine

## 2021-06-10 ENCOUNTER — Encounter: Payer: Self-pay | Admitting: Internal Medicine

## 2021-06-10 VITALS — BP 157/65 | HR 74 | Temp 98.4°F | Resp 18 | Wt 251.4 lb

## 2021-06-10 DIAGNOSIS — R569 Unspecified convulsions: Secondary | ICD-10-CM | POA: Diagnosis not present

## 2021-06-10 DIAGNOSIS — Z5112 Encounter for antineoplastic immunotherapy: Secondary | ICD-10-CM | POA: Diagnosis not present

## 2021-06-10 MED ORDER — LORAZEPAM 2 MG PO TABS: 2.0000 mg | ORAL_TABLET | Freq: Three times a day (TID) | ORAL | 0 refills | Status: DC | PRN

## 2021-06-10 NOTE — Progress Notes (Signed)
? ?[facility] [address]  ?[place], [place] [address] ?[phone] ? ? ?Interval Evaluation ? ?Date of Service: 06/10/21 ?Patient Name: Edward Pitts ?Patient MRN: [id number] ?Patient DOB: 1960/10/26 ?Provider: Ventura Sellers, MD ? ?Identifying Statement:  ?Edward Pitts is a 61 y.o. male with suspected seizures ? ?Primary Cancer: ? ?Oncologic History: ?Oncology History  ?Malignant melanoma of overlapping sites [facility])  ?04/23/2019 Initial Diagnosis  ? Malignant melanoma of overlapping sites [facility]) ?  ?07/23/2019 -  Chemotherapy  ? Patient is on Treatment Plan : MELANOMA ADJUVANT Nivolumab q14d  ?   ?08/06/2019 Cancer Staging  ? Staging form: Melanoma of the Skin, AJCC 8th Edition ?- Pathologic: Stage Unknown (rpTX, pN1b, cM0) - Signed by [name], MD on 07/27/2020 ?Stage prefix: Recurrence ? ?  ? ? ?Interval History: ?Edward Pitts presents for clinical follow up.  He describes considerable improvement in frequency of episodes since starting the vimpat.  No appreciable side effects.  He did experience a stereotypical event Wednesday this week, lasted 'longer than normal, maybe 10 minutes'.  He was confused afterwards.  This is the only seizure event since our prior visit 2 months, previously had described weekly episodes.  Continues to undergo opdivo infusions for melanoma.  ? ?H+P (04/15/21) Patient presents for follow up given neurologic complaints.  He has seen neurologist in [place] for several years, relevant history is obtained from review of prior records.  In short, he experiences paroxysmal episodes of "head in a vice, disconnected, in a daze, can't understand language".  Episodes started in 2019, last 1-2 minutes, and occur 2-3x per month.  He thinks frequency decreased when Clonazepam was started by Dr. Jannifer Franklin in 2019.  He had normal EEG at that time.  Recently was given a trial of Keppra, but he experienced significant dizziness and stopped it.  Had MRI brain without  contrast done recently through Dr. Tasia Catchings.  Continues on nivolumab infusions for melanoma.   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 ? ?Medications: ?Current Outpatient Medications on File Prior to Visit  ?Medication Sig Dispense Refill  ? allopurinol (ZYLOPRIM) 300 MG tablet Take by mouth.    ? ASPIRIN 81 PO Take 1 tablet by mouth daily.    ? atorvastatin (LIPITOR) 20 MG tablet Take 20 mg by mouth daily.    ? clonazePAM (KLONOPIN) 0.5 MG tablet 1 tablet in the morning, 2 in the evening 90 tablet 0  ? hydrocortisone 2.5 % ointment Apply topically 2 (two) times daily.    ? ketoconazole (NIZORAL) 2 % cream Apply 1 application topically daily.    ? Lacosamide (VIMPAT) 100 MG TABS Take 1 tablet (100 mg total) by mouth in the morning and at bedtime. 60 tablet 3  ? levETIRAcetam (KEPPRA) 250 MG tablet Take 250 mg by mouth in the morning and at bedtime.    ? lidocaine-prilocaine (EMLA) cream Apply 1 application topically as needed. Apply small amount of cream to port site approx 1-2 hours prior to appointment. 30 g 2  ? lisinopril (ZESTRIL) 20 MG tablet Take 20 mg by mouth daily.    ? metoprolol succinate (TOPROL-XL) 25 MG 24 hr tablet Take 25 mg by mouth daily.     ? Multiple Vitamin (MULTIVITAMIN WITH MINERALS) TABS tablet Take 1 tablet by mouth daily. Centrum Silver    ? nystatin (MYCOSTATIN/NYSTOP) powder Apply 1 application topically 3 (three) times daily. Apply  small amount to affected area 3 times a day until healed 60 g 2  ? omeprazole (PRILOSEC) 20 MG capsule TAKE 1 CAPSULE BY MOUTH EVERY DAY 30 capsule 1  ? ondansetron (ZOFRAN) 4 MG tablet TAKE 1 TABLET BY MOUTH EVERY 8 HOURS AS NEEDED FOR NAUSEA AND VOMITING 90 tablet 1  ? oxyCODONE (OXY IR/ROXICODONE) 5 MG immediate release tablet Take 1 tablet (5 mg total) by mouth every 4 (four) hours as needed for moderate pain (pain score 4-6). (Patient not taking:  Reported on 11/30/2020) 30 tablet 0  ? ?No current facility-administered medications on file prior to visit.  ? ? ?Allergies:  ?Allergies  ?Allergen Reactions  ? Ibuprofen Other (See Comments)  ?  Affects kidneys  ? Nsaids   ?  Affects kidneys  ? ?Past Medical History:  ?Past Medical History:  ?Diagnosis Date  ? Anemia   ? iron treatments  ? Anxiety   ? Aortic atherosclerosis ([place])   ? Arthritis   ? 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 left groin, resected, radiation  ? Cataract   ? Complication of anesthesia   ? PONV  ? Coronary artery disease   ? Dizziness of unknown etiology   ? has led to seizures and passing out.  ? Family history of adverse reaction to anesthesia   ? PONV mother  ? GERD (gastroesophageal reflux disease)   ? History of complete heart block   ? PPM placed  ? Hyperlipidemia   ? Hypertension   ? LBBB (left bundle branch block)   ? Lymphedema of left leg   ? uses thigh high compression stockings  ? Melanoma ([place]) 2012  ? skin cancer, left thigh  ? OSA on CPAP   ? PONV (postoperative nausea and vomiting) 04/16/2019  ? Port-A-Cath in place   ? RIGHT chest wall  ? Presence of cardiac pacemaker   ? Medtronic  ? Seizures ([place])   ? still has episodes of dizziness. last event 1 month ago (march 2022) and will pass out. takes clonazepam  ? ?Past Surgical History:  ?Past Surgical History:  ?Procedure Laterality Date  ? CT RADIATION THERAPY GUIDE    ? left groin  ? dental implant    ? permanent implant  ? KNEE SURGERY Left   ? arthroscopy  ? LEFT HEART CATH AND CORONARY ANGIOGRAPHY Left 06/29/2017  ? Procedure: LEFT HEART CATH AND CORONARY ANGIOGRAPHY;  Surgeon: [name], MD;  Location: [facility];  Service: Cardiovascular;  Laterality: Left;  ? LYMPH NODE DISSECTION Left 04/16/2019  ? Procedure: Left inguinal Lymph Node Dissection;  Surgeon: [name], MD;  Location: [facility];  Service: General;  Laterality: Left;  ? MELANOMA EXCISION Left 04/16/2019  ? Procedure: MELANOMA EXCISION LEFT GROIN MASS;   Surgeon: [name], MD;  Location: [facility];  Service: General;  Laterality: Left;  ? MELANOMA EXCISION WITH SENTINEL LYMPH NODE BIOPSY Left 2012  ? Left calf   ? PACEMAKER INSERTION N/A 08/26/2018  ? Procedure: INSERTION PACEMAKER;  Surgeon: [name], MD;  Location: [facility];  Service: Cardiovascular;  Laterality: N/A;  ? PORTA CATH INSERTION N/A 08/26/2019  ? Procedure: PORTA CATH INSERTION;  Surgeon: Katha Cabal, MD;  Location: [facility];  Service: Cardiovascular;  Laterality: N/A;  ? SUPERFICIAL LYMPH NODE BIOPSY / EXCISION Left 2020  ? lymph nodes removed around left groin melanoma site  ? TEMPORARY PACEMAKER N/A 08/25/2018  ? Procedure: TEMPORARY PACEMAKER;  Surgeon: [name],  MD;  Location: [facility];  Service: Cardiovascular;  Laterality: N/A;  ? TOTAL HIP ARTHROPLASTY Left 07/14/2020  ? Procedure: TOTAL HIP ARTHROPLASTY;  Surgeon: Dereck Leep, MD;  Location: [facility];  Service: Orthopedics;  Laterality: Left;  ? ?Social History:  ?Social History  ? ?Socioeconomic History  ? Marital status: Married  ?  Spouse name: Vicente Males   ? Number of children: 7  ? Years of education: 62  ? Highest education level: Not on file  ?Occupational History  ?  Comment: disability  ?Tobacco Use  ? Smoking status: Never  ? Smokeless tobacco: Never  ?Vaping Use  ? Vaping Use: Never used  ?Substance and Sexual Activity  ? Alcohol use: No  ? Drug use: No  ? Sexual activity: Not Currently  ?Other Topics Concern  ? Not on file  ?Social History Narrative  ? Lives with  Wife,  ? Has 2 small dogs  ? Caffeine use: sodas (2 per day)  ?   ? Out of work on disability.  Has a walk in shower. No stairs to climb  ? Oncology treatment ongoing. Uses port a cath for treatment.  ?   ? pacemaker  ? ?Social Determinants of Health  ? ?Financial Resource Strain: Not on file  ?Food Insecurity: Not on file  ?Transportation Needs: Not on file  ?Physical Activity: Not on file  ?Stress: Not on file  ?Social  Connections: Not on file  ?Intimate Partner Violence: Not on file  ? ?Family History:  ?Family History  ?Problem Relation Age of Onset  ? Cancer Paternal Grandmother   ? ? ?Review of Systems: ?Constitutional: Doesn

## 2021-06-13 ENCOUNTER — Other Ambulatory Visit: Payer: Medicaid Other

## 2021-06-13 ENCOUNTER — Ambulatory Visit: Payer: Medicaid Other | Admitting: Nurse Practitioner

## 2021-06-13 ENCOUNTER — Ambulatory Visit: Payer: Medicaid Other

## 2021-06-14 ENCOUNTER — Telehealth: Payer: Self-pay | Admitting: Neurology

## 2021-06-14 NOTE — Telephone Encounter (Signed)
LVM and sent mychart msg letting pt know of appt change- Edward Pitts out of office. ?

## 2021-06-21 ENCOUNTER — Ambulatory Visit (INDEPENDENT_AMBULATORY_CARE_PROVIDER_SITE_OTHER): Admitting: Neurology

## 2021-06-21 ENCOUNTER — Ambulatory Visit: Admitting: Neurology

## 2021-06-21 ENCOUNTER — Encounter: Payer: Self-pay | Admitting: Neurology

## 2021-06-21 ENCOUNTER — Telehealth: Payer: Self-pay | Admitting: Neurology

## 2021-06-21 ENCOUNTER — Other Ambulatory Visit: Payer: Self-pay

## 2021-06-21 VITALS — BP 131/65 | HR 78 | Ht 67.0 in | Wt 250.5 lb

## 2021-06-21 DIAGNOSIS — R569 Unspecified convulsions: Secondary | ICD-10-CM

## 2021-06-21 DIAGNOSIS — R42 Dizziness and giddiness: Secondary | ICD-10-CM | POA: Diagnosis not present

## 2021-06-21 DIAGNOSIS — R55 Syncope and collapse: Secondary | ICD-10-CM | POA: Diagnosis not present

## 2021-06-21 NOTE — Progress Notes (Signed)
? ? ?Reason for visit: Near syncope ? ?Edward Pitts is an 61 y.o. male ? ?Interval History 3/23/8/23 ?Patient presents today for follow-up, at last visit plan was to start low-dose Keppra for his episode concerning for seizures.  He did try the [place] but did have side effect of somnolence, worsening dizziness.  Keppra was discontinued and patient continued to have recurrent events.  He did follow with a neuro oncologist on 1/20 and he was started on Vimpat 100 mg twice daily.  Since starting the Vimpat he has been doing okay but reported he had 1 severe episode of dizziness on 3/18.  He described the episode as dizzy spell, seeing black spot with loss of muscle control, sometimes he will fall.  Patient stated overall since starting the Vimpat he has been doing well. He continue to use Clonazepam.  ? ? ?History of present illness: ?[name] is a 61 year old right-handed white male with a history of episodes of syncope and near syncope.  The patient had a pacemaker placed which seems to prevent the episodes of syncope but he still has relatively frequent events of altered mental status.  He will have episodes lasting 30 to 60 seconds where he feels like his head is in a vice, he feels like he is in a daydream.  He at times may feel palpitations of the heart with these events.  The events of occur 2 or 3 times a month.  He notes that clonazepam seems to help reduce the frequency of these episodes.  He recently was diagnosed with sleep apnea, he is now on CPAP at night.  He is sleeping better.  He also recently had a left total hip replacement.  He continues to operate a motor vehicle, he has not had any events while driving. ? ?Past Medical History:  ?Diagnosis Date  ? Anemia   ? iron treatments  ? Anxiety   ? Aortic atherosclerosis ([place])   ? Arthritis   ? 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 left groin, resected, radiation  ? Cataract   ? Complication of anesthesia   ? PONV  ? Coronary artery disease   ? Dizziness of  unknown etiology   ? has led to seizures and passing out.  ? Family history of adverse reaction to anesthesia   ? PONV mother  ? GERD (gastroesophageal reflux disease)   ? History of complete heart block   ? PPM placed  ? Hyperlipidemia   ? Hypertension   ? LBBB (left bundle branch block)   ? Lymphedema of left leg   ? uses thigh high compression stockings  ? Melanoma ([place]) 2012  ? skin cancer, left thigh  ? OSA on CPAP   ? PONV (postoperative nausea and vomiting) 04/16/2019  ? Port-A-Cath in place   ? RIGHT chest wall  ? Presence of cardiac pacemaker   ? Medtronic  ? Seizures ([place])   ? still has episodes of dizziness. last event 1 month ago (march 2022) and will pass out. takes clonazepam  ? ? ?Past Surgical History:  ?Procedure Laterality Date  ? CT RADIATION THERAPY GUIDE    ? left groin  ? dental implant    ? permanent implant  ? KNEE SURGERY Left   ? arthroscopy  ? LEFT HEART CATH AND CORONARY ANGIOGRAPHY Left 06/29/2017  ? Procedure: LEFT HEART CATH AND CORONARY ANGIOGRAPHY;  Surgeon: [name], MD;  Location: [facility];  Service: Cardiovascular;  Laterality: Left;  ? LYMPH  NODE DISSECTION Left 04/16/2019  ? Procedure: Left inguinal Lymph Node Dissection;  Surgeon: [name], MD;  Location: [facility];  Service: General;  Laterality: Left;  ? MELANOMA EXCISION Left 04/16/2019  ? Procedure: MELANOMA EXCISION LEFT GROIN MASS;  Surgeon: [name], MD;  Location: [facility];  Service: General;  Laterality: Left;  ? MELANOMA EXCISION WITH SENTINEL LYMPH NODE BIOPSY Left 2012  ? Left calf   ? PACEMAKER INSERTION N/A 08/26/2018  ? Procedure: INSERTION PACEMAKER;  Surgeon: [name], MD;  Location: [facility];  Service: Cardiovascular;  Laterality: N/A;  ? PORTA CATH INSERTION N/A 08/26/2019  ? Procedure: PORTA CATH INSERTION;  Surgeon: Katha Cabal, MD;  Location: [facility];  Service: Cardiovascular;  Laterality: N/A;  ? SUPERFICIAL LYMPH NODE BIOPSY / EXCISION Left 2020  ? lymph  nodes removed around left groin melanoma site  ? TEMPORARY PACEMAKER N/A 08/25/2018  ? Procedure: TEMPORARY PACEMAKER;  Surgeon: [name], MD;  Location: [facility];  Service: Cardiovascular;  Laterality: N/A;  ? TOTAL HIP ARTHROPLASTY Left 07/14/2020  ? Procedure: TOTAL HIP ARTHROPLASTY;  Surgeon: Dereck Leep, MD;  Location: [facility];  Service: Orthopedics;  Laterality: Left;  ? ? ?Family History  ?Problem Relation Age of Onset  ? Cancer Paternal Grandmother   ? ? ?Social history:  reports that he has never smoked. He has never used smokeless tobacco. He reports that he does not drink alcohol and does not use drugs. ? ?  ?Allergies  ?Allergen Reactions  ? Ibuprofen Other (See Comments)  ?  Affects kidneys  ? Nsaids   ?  Affects kidneys  ? ? ?Medications:  ? ?Current Outpatient Medications:  ?  allopurinol (ZYLOPRIM) 300 MG tablet, Take by mouth., Disp: , Rfl:  ?  ASPIRIN 81 PO, Take 1 tablet by mouth daily., Disp: , Rfl:  ?  atorvastatin (LIPITOR) 20 MG tablet, Take 20 mg by mouth daily., Disp: , Rfl:  ?  clonazePAM (KLONOPIN) 0.5 MG tablet, 1 tablet in the morning, 2 in the evening, Disp: 90 tablet, Rfl: 0 ?  hydrocortisone 2.5 % ointment, Apply topically 2 (two) times daily., Disp: , Rfl:  ?  ketoconazole (NIZORAL) 2 % cream, Apply 1 application topically daily., Disp: , Rfl:  ?  Lacosamide (VIMPAT) 100 MG TABS, Take 1 tablet (100 mg total) by mouth in the morning and at bedtime., Disp: 60 tablet, Rfl: 3 ?  lidocaine-prilocaine (EMLA) cream, Apply 1 application topically as needed. Apply small amount of cream to port site approx 1-2 hours prior to appointment., Disp: 30 g, Rfl: 2 ?  lisinopril (ZESTRIL) 20 MG tablet, Take 20 mg by mouth daily., Disp: , Rfl:  ?  LORazepam (ATIVAN) 2 MG tablet, Take 1 tablet (2 mg total) by mouth every 8 (eight) hours as needed for seizure., Disp: 20 tablet, Rfl: 0 ?  metoprolol succinate (TOPROL-XL) 25 MG 24 hr tablet, Take 25 mg by mouth daily. , Disp: , Rfl:   ?  Multiple Vitamin (MULTIVITAMIN WITH MINERALS) TABS tablet, Take 1 tablet by mouth daily. Centrum Silver, Disp: , Rfl:  ?  nystatin (MYCOSTATIN/NYSTOP) powder, Apply 1 application topically 3 (three) times daily. Apply small amount to affected area 3 times a day until healed, Disp: 60 g, Rfl: 2 ?  omeprazole (PRILOSEC) 20 MG capsule, TAKE 1 CAPSULE BY MOUTH EVERY DAY, Disp: 30 capsule, Rfl: 1 ?  ondansetron (ZOFRAN) 4 MG tablet, TAKE 1 TABLET BY MOUTH EVERY 8 HOURS AS  NEEDED FOR NAUSEA AND VOMITING, Disp: 90 tablet, Rfl: 1  ? ? ? ?ROS: ? ?Out of a complete 14 system review of symptoms, the patient complains only of the following symptoms, and all other reviewed systems are negative. ? ?Dizziness, near syncope ? ?Blood pressure 131/65, pulse 78, height '5\' 7"'$  (1.702 m), weight 250 lb 8 oz (113.6 kg). ? ?Physical Exam ? ?General: The patient is alert and cooperative at the time of the examination.  The patient is markedly obese. ? ?Skin: No significant peripheral edema is noted. ? ? ?Neurologic Exam ? ?Mental status: The patient is alert and oriented x 3 at the time of the examination. The patient has apparent normal recent and remote memory, with an apparently normal attention span and concentration ability. ? ? ?Cranial nerves: Facial symmetry is present. Speech is normal, no aphasia or dysarthria is noted. Extraocular movements are full. Visual fields are full. ? ?Motor: The patient has good strength in all 4 extremities, with exception of 4+/5 strength with hip flexion on the left. ? ?Sensory examination: Soft touch sensation is symmetric on the face, arms, and legs. ? ?Coordination: The patient has good finger-nose-finger and heel-to-shin bilaterally. ? ?Gait and station: The patient has a normal gait. Tandem gait is slightly unsteady. Romberg is negative. No drift is seen. ? ?Reflexes: Deep tendon reflexes are symmetric, with exception that the right ankle jerk reflex is more brisk than that on the  left. ? ? ?Assessment/Plan: ? ?1.  Episodes of near syncope, altered mental status ?2. Seizure like activity  ? ?The patient reports since starting Vimpat 100 mg twice daily, the frequency of the episodes has decreased

## 2021-06-21 NOTE — Telephone Encounter (Signed)
Can you put a new order in for Ambulatory EEG  ?

## 2021-06-21 NOTE — Telephone Encounter (Signed)
Done. Thanks.

## 2021-06-23 ENCOUNTER — Encounter: Payer: Self-pay | Admitting: Oncology

## 2021-06-23 ENCOUNTER — Inpatient Hospital Stay (HOSPITAL_BASED_OUTPATIENT_CLINIC_OR_DEPARTMENT_OTHER): Payer: Medicaid Other | Admitting: Oncology

## 2021-06-23 ENCOUNTER — Inpatient Hospital Stay: Payer: Medicaid Other

## 2021-06-23 VITALS — BP 156/76 | HR 69 | Temp 97.4°F | Wt 250.0 lb

## 2021-06-23 DIAGNOSIS — D649 Anemia, unspecified: Secondary | ICD-10-CM

## 2021-06-23 DIAGNOSIS — C438 Malignant melanoma of overlapping sites of skin: Secondary | ICD-10-CM

## 2021-06-23 DIAGNOSIS — Z5112 Encounter for antineoplastic immunotherapy: Secondary | ICD-10-CM

## 2021-06-23 DIAGNOSIS — R569 Unspecified convulsions: Secondary | ICD-10-CM | POA: Diagnosis not present

## 2021-06-23 DIAGNOSIS — L80 Vitiligo: Secondary | ICD-10-CM

## 2021-06-23 LAB — CBC WITH DIFFERENTIAL/PLATELET
Abs Immature Granulocytes: 0.02 10*3/uL (ref 0.00–0.07)
Basophils Absolute: 0 10*3/uL (ref 0.0–0.1)
Basophils Relative: 1 %
Eosinophils Absolute: 0.2 10*3/uL (ref 0.0–0.5)
Eosinophils Relative: 3 %
HCT: 33.7 % — ABNORMAL LOW (ref 39.0–52.0)
Hemoglobin: 11.3 g/dL — ABNORMAL LOW (ref 13.0–17.0)
Immature Granulocytes: 0 %
Lymphocytes Relative: 23 %
Lymphs Abs: 1.1 10*3/uL (ref 0.7–4.0)
MCH: 30.2 pg (ref 26.0–34.0)
MCHC: 33.5 g/dL (ref 30.0–36.0)
MCV: 90.1 fL (ref 80.0–100.0)
Monocytes Absolute: 0.5 10*3/uL (ref 0.1–1.0)
Monocytes Relative: 11 %
Neutro Abs: 3.1 10*3/uL (ref 1.7–7.7)
Neutrophils Relative %: 62 %
Platelets: 188 10*3/uL (ref 150–400)
RBC: 3.74 MIL/uL — ABNORMAL LOW (ref 4.22–5.81)
RDW: 13.4 % (ref 11.5–15.5)
WBC: 5 10*3/uL (ref 4.0–10.5)
nRBC: 0 % (ref 0.0–0.2)

## 2021-06-23 LAB — COMPREHENSIVE METABOLIC PANEL
ALT: 37 U/L (ref 0–44)
AST: 29 U/L (ref 15–41)
Albumin: 3.9 g/dL (ref 3.5–5.0)
Alkaline Phosphatase: 87 U/L (ref 38–126)
Anion gap: 6 (ref 5–15)
BUN: 23 mg/dL (ref 8–23)
CO2: 26 mmol/L (ref 22–32)
Calcium: 9.1 mg/dL (ref 8.9–10.3)
Chloride: 109 mmol/L (ref 98–111)
Creatinine, Ser: 1.18 mg/dL (ref 0.61–1.24)
GFR, Estimated: >60 mL/min (ref >60)
Glucose, Bld: 130 mg/dL — ABNORMAL HIGH (ref 70–99)
Potassium: 4.3 mmol/L (ref 3.5–5.1)
Sodium: 141 mmol/L (ref 135–145)
Total Bilirubin: 0.6 mg/dL (ref 0.3–1.2)
Total Protein: 7.1 g/dL (ref 6.5–8.1)

## 2021-06-23 MED ORDER — SODIUM CHLORIDE 0.9 % IV SOLN
Freq: Once | INTRAVENOUS | Status: AC
  Filled 2021-06-23: qty 250

## 2021-06-23 MED ORDER — SODIUM CHLORIDE 0.9% FLUSH
10.0000 mL | INTRAVENOUS | Status: DC | PRN
  Filled 2021-06-23: qty 10

## 2021-06-23 MED ORDER — SODIUM CHLORIDE 0.9 % IV SOLN
240.0000 mg | Freq: Once | INTRAVENOUS | Status: AC
  Administered 2021-06-23: 240 mg via INTRAVENOUS
  Filled 2021-06-23: qty 24

## 2021-06-23 MED ORDER — HEPARIN SOD (PORK) LOCK FLUSH 100 UNIT/ML IV SOLN
500.0000 [IU] | Freq: Once | INTRAVENOUS | Status: AC | PRN
  Administered 2021-06-23: 500 [IU]
  Filled 2021-06-23: qty 5

## 2021-06-23 NOTE — Progress Notes (Signed)
Patient here for follow up. Patient states he had seizure last night. Per patient they are getting more frequent and more severe. ?

## 2021-06-23 NOTE — Patient Instructions (Signed)
[facility]  Discharge Instructions: ?Thank you for choosing [facility] to provide your oncology and hematology care.  ?If you have a lab appointment with the Cancer Center, please go directly to the Cancer Center and check in at the registration area. ? ?Wear comfortable clothing and clothing appropriate for easy access to any Portacath or PICC line.  ? ?We strive to give you quality time with your provider. You may need to reschedule your appointment if you arrive late (15 or more minutes).  Arriving late affects you and other patients whose appointments are after yours.  Also, if you miss three or more appointments without notifying the office, you may be dismissed from the clinic at the provider?s discretion.    ?  ?For prescription refill requests, have your pharmacy contact our office and allow 72 hours for refills to be completed.   ? ?Today you received the following chemotherapy and/or immunotherapy agents: Opdivo    ?  ?To help prevent nausea and vomiting after your treatment, we encourage you to take your nausea medication as directed. ? ?BELOW ARE SYMPTOMS THAT SHOULD BE REPORTED IMMEDIATELY: ?*FEVER GREATER THAN 100.4 F (38 ?C) OR HIGHER ?*CHILLS OR SWEATING ?*NAUSEA AND VOMITING THAT IS NOT CONTROLLED WITH YOUR NAUSEA MEDICATION ?*UNUSUAL SHORTNESS OF BREATH ?*UNUSUAL BRUISING OR BLEEDING ?*URINARY PROBLEMS (pain or burning when urinating, or frequent urination) ?*BOWEL PROBLEMS (unusual diarrhea, constipation, pain near the anus) ?TENDERNESS IN MOUTH AND THROAT WITH OR WITHOUT PRESENCE OF ULCERS (sore throat, sores in mouth, or a toothache) ?UNUSUAL RASH, SWELLING OR PAIN  ?UNUSUAL VAGINAL DISCHARGE OR ITCHING  ? ?Items with * indicate a potential emergency and should be followed up as soon as possible or go to the Emergency Department if any problems should occur. ? ?Please show the CHEMOTHERAPY ALERT CARD or IMMUNOTHERAPY ALERT CARD at check-in to the  Emergency Department and triage nurse. ? ?Should you have questions after your visit or need to cancel or reschedule your appointment, please contact [facility]  [phone] and follow the prompts.  Office hours are 8:00 a.m. to 4:30 p.m. Monday - Friday. Please note that voicemails left after 4:00 p.m. may not be returned until the following business day.  We are closed weekends and major holidays. You have access to a nurse at all times for urgent questions. Please call the main number to the clinic [phone] and follow the prompts. ? ?For any non-urgent questions, you may also contact your provider using MyChart. We now offer e-Visits for anyone 18 and older to request care online for non-urgent symptoms. For details visit [email or web address]. ?  ?Also download the MyChart app! Go to the app store, search "MyChart", open the app, select [facility], and log in with your MyChart username and password. ? ?Due to Covid, a mask is required upon entering the hospital/clinic. If you do not have a mask, one will be given to you upon arrival. For doctor visits, patients may have 1 support person aged 18 or older with them. For treatment visits, patients cannot have anyone with them due to current Covid guidelines and our immunocompromised population.  ?

## 2021-06-23 NOTE — Progress Notes (Signed)
?Hematology/Oncology Progress note ?Telephone:([phone] [phone] ?  ? ? ? ?Patient Care Team: ?Mechele Claude, FNP as PCP - General (Family Medicine) ?[name], MD as Consulting Physician (Oncology) ?Ventura Sellers, MD as Consulting Physician (Oncology) ?[name], MD as Consulting Physician (Cardiology) ? ?REFERRING PROVIDER: ?Mechele Claude, FNP  ?CHIEF COMPLAINTS/REASON FOR VISIT:  ?Follow up for melanoma ?HISTORY OF PRESENTING ILLNESS:  ? ?Edward Pitts is a  61 y.o.  male with PMH listed below was seen in consultation at the request of  Mechele Claude, FNP  for evaluation of inguinal mass ?Patient presented to emergency room 3 days ago complaining about left ing uinal mass discomfort. ?Reports that he has really noticed the mass growing for the past 1 months. ?He has a history of left lower extremity melanoma in 2011, status post local excision.  Pain was increased with squatting of laxation. ?He was advised to take Tylenol for pain. ?Denies any fever, chills, night sweating.  He does feel mild nauseated. ?Appetite is fair.  He has lost about 10 pounds since earlier this year. ?In the emergency room CT scan was done which showed left inguinal mass with diabetes as large as 11.6 cm.  Left inguinal and left iliac nodes which are suspicious for involvement.  There are also 2 small nonspecific hypodense lesions within the right liver, nonspecific. ? ?# patient underwent left groin mass resection on 04/16/2019. ?Resection pathology showed malignant melanoma, replacing a lymph node, with extracapsular extension, peripheral and deep margins involved.  Left inguinal contents, all 7 lymph nodes were negative for melanoma in the lymph nodes.  Extranodal melanoma identified in lymphatic and interstitium between nodes ?#07/07/2019, status post adjuvant radiation. ? ?# PDL1 80% TPS  ?# 04/16/2019. underwent left groin mass resection  ? 07/07/2019  Status post adjuvant radiation and finished  radiation  ?Patient has Mediport placed to facilitate immunotherapy treatments. ? ?06/29/2020, CT chest abdomen pelvis showed stable postoperative appearance of the left groin.  No evidence of local recurrence.  No evidence of metastatic disease in the chest abdomen or pelvis.  Hepatic steatosis.  Stable subcentimeter fluid attenuation lesion of the lateral right lobe of the liver, likely benign cyst or hemangioma.  Coronary artery disease.  Aortic atherosclerosis ? ?10/20/2020, CT chest abdomen pelvis showed stable postoperative/radiation appearance of the left groin.  No evidence of local recurrence/metastatic disease within the chest abdomen/pelvis.  Fatty liver disease.  Diverticulosis without evidence of typhlitis.  Aortic atherosclerosis ? ?03/10/2021 MRI brain without contrast showed no definitive evidence of intracranial metastatic disease.  Study is limited by absence of intravenous contrast. ? ?04/18/2021, patient establish care with neurology Dr. Mickeal Skinner for intermittent altered cognition.  he was recommended to start Vimpat.   ? ?04/26/2021, CT chest abdomen pelvis without contrast showed stable post operative changes of left groin with no evidence of recurrent disease.  No evidence of metastatic disease in the chest abdomen pelvis.  Aortic atherosclerosis. ? ?INTERVAL HISTORY ?Edward Pitts is a 61 y.o. male who has above history reviewed by me today presents for follow up visit for management of inguinal nodal recurrence of melanoma ?Patient takes Vimpat for episodes of altered cognition.he follows up with neurology.  ?Lower extremity swelling and SOB have both improved ? ?:Review of Systems  ?Constitutional:  Negative for appetite change, chills, fatigue, fever and unexpected weight change.  ?HENT:   Negative for hearing loss and voice change.   ?Eyes:  Negative for eye problems and icterus.  ?Respiratory:  Positive for shortness of breath. Negative for chest tightness and cough.   ?Cardiovascular:  Positive  for leg swelling. Negative for chest pain.  ?Gastrointestinal:  Negative for abdominal distention and abdominal pain.  ?Endocrine: Negative for hot flashes.  ?Genitourinary:  Negative for difficulty urinating, dysuria and frequency.   ?Musculoskeletal:   ?     Status post left hip replacement  ?Skin:  Negative for itching and rash.  ?     Skin hypo-pigmentation on upper extremities, no change  ?Neurological:  Negative for light-headedness and numbness.  ?     Spells  ?Hematological:  Negative for adenopathy. Does not bruise/bleed easily.  ?Psychiatric/Behavioral:  Negative for confusion.   ? ?MEDICAL HISTORY:  ?Past Medical History:  ?Diagnosis Date  ? Anemia   ? iron treatments  ? Anxiety   ? Aortic atherosclerosis ([place])   ? Arthritis   ? 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 left groin, resected, radiation  ? Cataract   ? Complication of anesthesia   ? PONV  ? Coronary artery disease   ? Dizziness of unknown etiology   ? has led to seizures and passing out.  ? Family history of adverse reaction to anesthesia   ? PONV mother  ? GERD (gastroesophageal reflux disease)   ? History of complete heart block   ? PPM placed  ? Hyperlipidemia   ? Hypertension   ? LBBB (left bundle branch block)   ? Lymphedema of left leg   ? uses thigh high compression stockings  ? Melanoma ([place]) 2012  ? skin cancer, left thigh  ? OSA on CPAP   ? PONV (postoperative nausea and vomiting) 04/16/2019  ? Port-A-Cath in place   ? RIGHT chest wall  ? Presence of cardiac pacemaker   ? Medtronic  ? Seizures ([place])   ? still has episodes of dizziness. last event 1 month ago (march 2022) and will pass out. takes clonazepam  ? ? ?SURGICAL HISTORY: ?Past Surgical History:  ?Procedure Laterality Date  ? CT RADIATION THERAPY GUIDE    ? left groin  ? dental implant    ? permanent implant  ? KNEE SURGERY Left   ? arthroscopy  ? LEFT HEART CATH AND CORONARY ANGIOGRAPHY Left 06/29/2017  ? Procedure: LEFT HEART CATH AND CORONARY ANGIOGRAPHY;  Surgeon: [name],  MD;  Location: [facility];  Service: Cardiovascular;  Laterality: Left;  ? LYMPH NODE DISSECTION Left 04/16/2019  ? Procedure: Left inguinal Lymph Node Dissection;  Surgeon: [name], MD;  Location: [facility];  Service: General;  Laterality: Left;  ? MELANOMA EXCISION Left 04/16/2019  ? Procedure: MELANOMA EXCISION LEFT GROIN MASS;  Surgeon: [name], MD;  Location: [facility];  Service: General;  Laterality: Left;  ? MELANOMA EXCISION WITH SENTINEL LYMPH NODE BIOPSY Left 2012  ? Left calf   ? PACEMAKER INSERTION N/A 08/26/2018  ? Procedure: INSERTION PACEMAKER;  Surgeon: [name], MD;  Location: [facility];  Service: Cardiovascular;  Laterality: N/A;  ? PORTA CATH INSERTION N/A 08/26/2019  ? Procedure: PORTA CATH INSERTION;  Surgeon: Katha Cabal, MD;  Location: [facility];  Service: Cardiovascular;  Laterality: N/A;  ? SUPERFICIAL LYMPH NODE BIOPSY / EXCISION Left 2020  ? lymph nodes removed around left groin melanoma site  ? TEMPORARY PACEMAKER N/A 08/25/2018  ? Procedure: TEMPORARY PACEMAKER;  Surgeon: [name], MD;  Location: [facility];  Service: Cardiovascular;  Laterality: N/A;  ? TOTAL HIP ARTHROPLASTY Left  07/14/2020  ? Procedure: TOTAL HIP ARTHROPLASTY;  Surgeon: Dereck Leep, MD;  Location: [facility];  Service: Orthopedics;  Laterality: Left;  ? ? ?SOCIAL HISTORY: ?Social History  ? ?Socioeconomic History  ? Marital status: Married  ?  Spouse name: Vicente Males   ? Number of children: 7  ? Years of education: 27  ? Highest education level: Not on file  ?Occupational History  ?  Comment: disability  ?Tobacco Use  ? Smoking status: Never  ? Smokeless tobacco: Never  ?Vaping Use  ? Vaping Use: Never used  ?Substance and Sexual Activity  ? Alcohol use: No  ? Drug use: No  ? Sexual activity: Not Currently  ?Other Topics Concern  ? Not on file  ?Social History Narrative  ? Lives with  Wife,  ? Has 2 small dogs  ? Caffeine use: sodas (2 per day)  ?   ? Out of work on  disability.  Has a walk in shower. No stairs to climb  ? Oncology treatment ongoing. Uses port a cath for treatment.  ?   ? pacemaker  ? ?Social Determinants of Health  ? ?Financial Resource Strain: Not o

## 2021-06-27 NOTE — Telephone Encounter (Signed)
Thank you, faxed to faxed to [facility] sent Lovey Newcomer a messaged  to let me know when it is scheduled.  ?

## 2021-07-07 ENCOUNTER — Inpatient Hospital Stay: Payer: Medicaid Other

## 2021-07-07 ENCOUNTER — Encounter: Payer: Self-pay | Admitting: Oncology

## 2021-07-07 ENCOUNTER — Inpatient Hospital Stay (HOSPITAL_BASED_OUTPATIENT_CLINIC_OR_DEPARTMENT_OTHER): Payer: Medicaid Other | Admitting: Oncology

## 2021-07-07 ENCOUNTER — Inpatient Hospital Stay: Payer: Medicaid Other | Attending: Oncology

## 2021-07-07 VITALS — BP 137/64 | HR 73 | Temp 98.7°F | Resp 20 | Ht 67.0 in | Wt 252.7 lb

## 2021-07-07 DIAGNOSIS — Z5112 Encounter for antineoplastic immunotherapy: Secondary | ICD-10-CM

## 2021-07-07 DIAGNOSIS — Z452 Encounter for adjustment and management of vascular access device: Secondary | ICD-10-CM | POA: Diagnosis not present

## 2021-07-07 DIAGNOSIS — C4359 Malignant melanoma of other part of trunk: Secondary | ICD-10-CM | POA: Insufficient documentation

## 2021-07-07 DIAGNOSIS — R4189 Other symptoms and signs involving cognitive functions and awareness: Secondary | ICD-10-CM

## 2021-07-07 DIAGNOSIS — L80 Vitiligo: Secondary | ICD-10-CM | POA: Insufficient documentation

## 2021-07-07 DIAGNOSIS — D649 Anemia, unspecified: Secondary | ICD-10-CM | POA: Insufficient documentation

## 2021-07-07 DIAGNOSIS — C438 Malignant melanoma of overlapping sites of skin: Secondary | ICD-10-CM

## 2021-07-07 DIAGNOSIS — R569 Unspecified convulsions: Secondary | ICD-10-CM | POA: Insufficient documentation

## 2021-07-07 DIAGNOSIS — N189 Chronic kidney disease, unspecified: Secondary | ICD-10-CM | POA: Insufficient documentation

## 2021-07-07 LAB — CBC WITH DIFFERENTIAL/PLATELET
Abs Immature Granulocytes: 0.05 10*3/uL (ref 0.00–0.07)
Basophils Absolute: 0 10*3/uL (ref 0.0–0.1)
Basophils Relative: 1 %
Eosinophils Absolute: 0.2 10*3/uL (ref 0.0–0.5)
Eosinophils Relative: 4 %
HCT: 35.3 % — ABNORMAL LOW (ref 39.0–52.0)
Hemoglobin: 11.8 g/dL — ABNORMAL LOW (ref 13.0–17.0)
Immature Granulocytes: 1 %
Lymphocytes Relative: 23 %
Lymphs Abs: 1.4 10*3/uL (ref 0.7–4.0)
MCH: 30.1 pg (ref 26.0–34.0)
MCHC: 33.4 g/dL (ref 30.0–36.0)
MCV: 90.1 fL (ref 80.0–100.0)
Monocytes Absolute: 0.5 10*3/uL (ref 0.1–1.0)
Monocytes Relative: 9 %
Neutro Abs: 3.8 10*3/uL (ref 1.7–7.7)
Neutrophils Relative %: 62 %
Platelets: 181 10*3/uL (ref 150–400)
RBC: 3.92 MIL/uL — ABNORMAL LOW (ref 4.22–5.81)
RDW: 13.4 % (ref 11.5–15.5)
WBC: 6 10*3/uL (ref 4.0–10.5)
nRBC: 0 % (ref 0.0–0.2)

## 2021-07-07 LAB — COMPREHENSIVE METABOLIC PANEL
ALT: 44 U/L (ref 0–44)
AST: 34 U/L (ref 15–41)
Albumin: 4.1 g/dL (ref 3.5–5.0)
Alkaline Phosphatase: 90 U/L (ref 38–126)
Anion gap: 6 (ref 5–15)
BUN: 30 mg/dL — ABNORMAL HIGH (ref 8–23)
CO2: 24 mmol/L (ref 22–32)
Calcium: 9 mg/dL (ref 8.9–10.3)
Chloride: 106 mmol/L (ref 98–111)
Creatinine, Ser: 1.29 mg/dL — ABNORMAL HIGH (ref 0.61–1.24)
GFR, Estimated: >60 mL/min (ref >60)
Glucose, Bld: 146 mg/dL — ABNORMAL HIGH (ref 70–99)
Potassium: 3.8 mmol/L (ref 3.5–5.1)
Sodium: 136 mmol/L (ref 135–145)
Total Bilirubin: 0.9 mg/dL (ref 0.3–1.2)
Total Protein: 7.5 g/dL (ref 6.5–8.1)

## 2021-07-07 MED ORDER — SODIUM CHLORIDE 0.9% FLUSH
10.0000 mL | INTRAVENOUS | Status: DC | PRN
  Administered 2021-07-07: 10 mL
  Filled 2021-07-07: qty 10

## 2021-07-07 MED ORDER — HEPARIN SOD (PORK) LOCK FLUSH 100 UNIT/ML IV SOLN
500.0000 [IU] | Freq: Once | INTRAVENOUS | Status: AC | PRN
  Administered 2021-07-07: 500 [IU]
  Filled 2021-07-07: qty 5

## 2021-07-07 MED ORDER — SODIUM CHLORIDE 0.9 % IV SOLN
Freq: Once | INTRAVENOUS | Status: AC
  Filled 2021-07-07: qty 250

## 2021-07-07 MED ORDER — SODIUM CHLORIDE 0.9 % IV SOLN
240.0000 mg | Freq: Once | INTRAVENOUS | Status: AC
  Administered 2021-07-07: 240 mg via INTRAVENOUS
  Filled 2021-07-07: qty 24

## 2021-07-07 NOTE — Progress Notes (Signed)
?Hematology/Oncology Progress note ?Telephone:(336) [phone] [phone] ?  ? ? ? ?Patient Care Team: ?Mechele Claude, FNP as PCP - General (Family Medicine) ?[name], MD as Consulting Physician (Oncology) ?Ventura Sellers, MD as Consulting Physician (Oncology) ?[name], MD as Consulting Physician (Cardiology) ? ?REFERRING PROVIDER: ?Mechele Claude, FNP  ?CHIEF COMPLAINTS/REASON FOR VISIT:  ?Follow up for melanoma ?HISTORY OF PRESENTING ILLNESS:  ? ?Edward Pitts is a  61 y.o.  male with PMH listed below was seen in consultation at the request of  Mechele Claude, FNP  for evaluation of inguinal mass ?Patient presented to emergency room 3 days ago complaining about left ing uinal mass discomfort. ?Reports that he has really noticed the mass growing for the past 1 months. ?He has a history of left lower extremity melanoma in 2011, status post local excision.  Pain was increased with squatting of laxation. ?He was advised to take Tylenol for pain. ?Denies any fever, chills, night sweating.  He does feel mild nauseated. ?Appetite is fair.  He has lost about 10 pounds since earlier this year. ?In the emergency room CT scan was done which showed left inguinal mass with diabetes as large as 11.6 cm.  Left inguinal and left iliac nodes which are suspicious for involvement.  There are also 2 small nonspecific hypodense lesions within the right liver, nonspecific. ? ?# patient underwent left groin mass resection on 04/16/2019. ?Resection pathology showed malignant melanoma, replacing a lymph node, with extracapsular extension, peripheral and deep margins involved.  Left inguinal contents, all 7 lymph nodes were negative for melanoma in the lymph nodes.  Extranodal melanoma identified in lymphatic and interstitium between nodes ?#07/07/2019, status post adjuvant radiation. ? ?# PDL1 80% TPS  ?# 04/16/2019. underwent left groin mass resection  ? 07/07/2019  Status post adjuvant radiation and finished  radiation  ?Patient has Mediport placed to facilitate immunotherapy treatments. ? ?06/29/2020, CT chest abdomen pelvis showed stable postoperative appearance of the left groin.  No evidence of local recurrence.  No evidence of metastatic disease in the chest abdomen or pelvis.  Hepatic steatosis.  Stable subcentimeter fluid attenuation lesion of the lateral right lobe of the liver, likely benign cyst or hemangioma.  Coronary artery disease.  Aortic atherosclerosis ? ?10/20/2020, CT chest abdomen pelvis showed stable postoperative/radiation appearance of the left groin.  No evidence of local recurrence/metastatic disease within the chest abdomen/pelvis.  Fatty liver disease.  Diverticulosis without evidence of typhlitis.  Aortic atherosclerosis ? ?03/10/2021 MRI brain without contrast showed no definitive evidence of intracranial metastatic disease.  Study is limited by absence of intravenous contrast. ? ?04/18/2021, patient establish care with neurology Dr. Mickeal Skinner for intermittent altered cognition.  he was recommended to start Vimpat.   ? ?04/26/2021, CT chest abdomen pelvis without contrast showed stable post operative changes of left groin with no evidence of recurrent disease.  No evidence of metastatic disease in the chest abdomen pelvis.  Aortic atherosclerosis. ? ?INTERVAL HISTORY ?Edward Pitts is a 61 y.o. male who has above history reviewed by me today presents for follow up visit for management of inguinal nodal recurrence of melanoma ?Patient takes Vimpat for episodes of altered cognition.he follows up with neurology.  ?Patient reports feeling well today.  No new complaints.  Denies any nausea vomiting diarrhea. ? ?:Review of Systems  ?Constitutional:  Negative for appetite change, chills, fatigue, fever and unexpected weight change.  ?HENT:   Negative for hearing loss and voice change.   ?Eyes:  Negative  for eye problems and icterus.  ?Respiratory:  Positive for shortness of breath. Negative for chest  tightness and cough.   ?Cardiovascular:  Positive for leg swelling. Negative for chest pain.  ?Gastrointestinal:  Negative for abdominal distention and abdominal pain.  ?Endocrine: Negative for hot flashes.  ?Genitourinary:  Negative for difficulty urinating, dysuria and frequency.   ?Musculoskeletal:   ?     Status post left hip replacement  ?Skin:  Negative for itching and rash.  ?     Skin hypo-pigmentation on upper extremities, no change  ?Neurological:  Negative for light-headedness and numbness.  ?     Spells  ?Hematological:  Negative for adenopathy. Does not bruise/bleed easily.  ?Psychiatric/Behavioral:  Negative for confusion.   ? ?MEDICAL HISTORY:  ?Past Medical History:  ?Diagnosis Date  ? Anemia   ? iron treatments  ? Anxiety   ? Aortic atherosclerosis ([facility])   ? Arthritis   ? Cancer of groin ([facility]) 2021  ? left groin, resected, radiation  ? Cataract   ? Complication of anesthesia   ? PONV  ? Coronary artery disease   ? Dizziness of unknown etiology   ? has led to seizures and passing out.  ? Family history of adverse reaction to anesthesia   ? PONV mother  ? GERD (gastroesophageal reflux disease)   ? History of complete heart block   ? PPM placed  ? Hyperlipidemia   ? Hypertension   ? LBBB (left bundle branch block)   ? Lymphedema of left leg   ? uses thigh high compression stockings  ? Melanoma ([facility]) 2012  ? skin cancer, left thigh  ? OSA on CPAP   ? PONV (postoperative nausea and vomiting) 04/16/2019  ? Port-A-Cath in place   ? RIGHT chest wall  ? Presence of cardiac pacemaker   ? Medtronic  ? Seizures ([facility])   ? still has episodes of dizziness. last event 1 month ago (march 2022) and will pass out. takes clonazepam  ? ? ?SURGICAL HISTORY: ?Past Surgical History:  ?Procedure Laterality Date  ? CT RADIATION THERAPY GUIDE    ? left groin  ? dental implant    ? permanent implant  ? KNEE SURGERY Left   ? arthroscopy  ? LEFT HEART CATH AND CORONARY ANGIOGRAPHY Left 06/29/2017  ? Procedure: LEFT HEART CATH AND  CORONARY ANGIOGRAPHY;  Surgeon: Lamar Blinks, MD;  Location: [facility];  Service: Cardiovascular;  Laterality: Left;  ? LYMPH NODE DISSECTION Left 04/16/2019  ? Procedure: Left inguinal Lymph Node Dissection;  Surgeon: Almond Lint, MD;  Location: [facility];  Service: General;  Laterality: Left;  ? MELANOMA EXCISION Left 04/16/2019  ? Procedure: MELANOMA EXCISION LEFT GROIN MASS;  Surgeon: Almond Lint, MD;  Location: [facility];  Service: General;  Laterality: Left;  ? MELANOMA EXCISION WITH SENTINEL LYMPH NODE BIOPSY Left 2012  ? Left calf   ? PACEMAKER INSERTION N/A 08/26/2018  ? Procedure: INSERTION PACEMAKER;  Surgeon: Marcina Millard, MD;  Location: [facility];  Service: Cardiovascular;  Laterality: N/A;  ? PORTA CATH INSERTION N/A 08/26/2019  ? Procedure: PORTA CATH INSERTION;  Surgeon: Renford Dills, MD;  Location: [facility];  Service: Cardiovascular;  Laterality: N/A;  ? SUPERFICIAL LYMPH NODE BIOPSY / EXCISION Left 2020  ? lymph nodes removed around left groin melanoma site  ? TEMPORARY PACEMAKER N/A 08/25/2018  ? Procedure: TEMPORARY PACEMAKER;  Surgeon: Tonny Bollman, MD;  Location: [facility];  Service: Cardiovascular;  Laterality: N/A;  ? TOTAL HIP ARTHROPLASTY Left 07/14/2020  ? Procedure: TOTAL HIP ARTHROPLASTY;  Surgeon: Dereck Leep, MD;  Location: [facility];  Service: Orthopedics;  Laterality: Left;  ? ? ?SOCIAL HISTORY: ?Social History  ? ?Socioeconomic History  ? Marital status: Married  ?  Spouse name: Vicente Males   ? Number of children: 7  ? Years of education: 35  ? Highest education level: Not on file  ?Occupational History  ?  Comment: disability  ?Tobacco Use  ? Smoking status: Never  ? Smokeless tobacco: Never  ?Vaping Use  ? Vaping Use: Never used  ?Substance and Sexual Activity  ? Alcohol use: No  ? Drug use: No  ? Sexual activity: Not Currently  ?Other Topics Concern  ? Not on file  ?Social History Narrative  ? Lives with  Wife,  ? Has 2 small dogs  ?  Caffeine use: sodas (2 per day)  ?   ? Out of work on disability.  Has a walk in shower. No stairs to climb  ? Oncology treatment ongoing. Uses port a cath for treatment.  ?   ? pacemaker  ? ?Social Determinants of H

## 2021-07-07 NOTE — Patient Instructions (Signed)
[facility]  Discharge Instructions: ?Thank you for choosing [facility] to provide your oncology and hematology care.  ?If you have a lab appointment with the Cancer Center, please go directly to the Cancer Center and check in at the registration area. ? ?Wear comfortable clothing and clothing appropriate for easy access to any Portacath or PICC line.  ? ?We strive to give you quality time with your provider. You may need to reschedule your appointment if you arrive late (15 or more minutes).  Arriving late affects you and other patients whose appointments are after yours.  Also, if you miss three or more appointments without notifying the office, you may be dismissed from the clinic at the provider?s discretion.    ?  ?For prescription refill requests, have your pharmacy contact our office and allow 72 hours for refills to be completed.   ? ?Today you received the following chemotherapy and/or immunotherapy agents OPDIVO    ?  ?To help prevent nausea and vomiting after your treatment, we encourage you to take your nausea medication as directed. ? ?BELOW ARE SYMPTOMS THAT SHOULD BE REPORTED IMMEDIATELY: ?*FEVER GREATER THAN 100.4 F (38 ?C) OR HIGHER ?*CHILLS OR SWEATING ?*NAUSEA AND VOMITING THAT IS NOT CONTROLLED WITH YOUR NAUSEA MEDICATION ?*UNUSUAL SHORTNESS OF BREATH ?*UNUSUAL BRUISING OR BLEEDING ?*URINARY PROBLEMS (pain or burning when urinating, or frequent urination) ?*BOWEL PROBLEMS (unusual diarrhea, constipation, pain near the anus) ?TENDERNESS IN MOUTH AND THROAT WITH OR WITHOUT PRESENCE OF ULCERS (sore throat, sores in mouth, or a toothache) ?UNUSUAL RASH, SWELLING OR PAIN  ?UNUSUAL VAGINAL DISCHARGE OR ITCHING  ? ?Items with * indicate a potential emergency and should be followed up as soon as possible or go to the Emergency Department if any problems should occur. ? ?Please show the CHEMOTHERAPY ALERT CARD or IMMUNOTHERAPY ALERT CARD at check-in to the  Emergency Department and triage nurse. ? ?Should you have questions after your visit or need to cancel or reschedule your appointment, please contact [facility]  [phone] and follow the prompts.  Office hours are 8:00 a.m. to 4:30 p.m. Monday - Friday. Please note that voicemails left after 4:00 p.m. may not be returned until the following business day.  We are closed weekends and major holidays. You have access to a nurse at all times for urgent questions. Please call the main number to the clinic [phone] and follow the prompts. ? ?For any non-urgent questions, you may also contact your provider using MyChart. We now offer e-Visits for anyone 18 and older to request care online for non-urgent symptoms. For details visit [email or web address]. ?  ?Also download the MyChart app! Go to the app store, search "MyChart", open the app, select [facility], and log in with your MyChart username and password. ? ?Due to Covid, a mask is required upon entering the hospital/clinic. If you do not have a mask, one will be given to you upon arrival. For doctor visits, patients may have 1 support person aged 18 or older with them. For treatment visits, patients cannot have anyone with them due to current Covid guidelines and our immunocompromised population.  ? ?Nivolumab injection ?What is this medication? ?NIVOLUMAB (nye VOL ue mab) is a monoclonal antibody. It treats certain types of cancer. Some of the cancers treated are colon cancer, head and neck cancer, Hodgkin lymphoma, lung cancer, and melanoma. ?This medicine may be used for other purposes; ask your health care provider or pharmacist if   you have questions. ?COMMON BRAND NAME(S): Opdivo ?What should I tell my care team before I take this medication? ?They need to know if you have any of these conditions: ?Autoimmune diseases such as Crohn's disease, ulcerative colitis, or lupus ?Have had or planning to have an allogeneic stem  cell transplant (uses someone else's stem cells) ?History of chest radiation ?Organ transplant ?Nervous system problems such as myasthenia gravis or Guillain-Barre syndrome ?An unusual or allergic reaction to nivolumab, other medicines, foods, dyes, or preservatives ?Pregnant or trying to get pregnant ?Breast-feeding ?How should I use this medication? ?This medication is injected into a vein. It is given in a hospital or clinic setting. ?A special MedGuide will be given to you before each treatment. Be sure to read this information carefully each time. ?Talk to your care team regarding the use of this medication in children. While it may be prescribed for children as young as 12 years for selected conditions, precautions do apply. ?Overdosage: If you think you have taken too much of this medicine contact a poison control center or emergency room at once. ?NOTE: This medicine is only for you. Do not share this medicine with others. ?What if I miss a dose? ?Keep appointments for follow-up doses. It is important not to miss your dose. Call your care team if you are unable to keep an appointment. ?What may interact with this medication? ?Interactions have not been studied. ?This list may not describe all possible interactions. Give your health care provider a list of all the medicines, herbs, non-prescription drugs, or dietary supplements you use. Also tell them if you smoke, drink alcohol, or use illegal drugs. Some items may interact with your medicine. ?What should I watch for while using this medication? ?Your condition will be monitored carefully while you are receiving this medication. ?You may need blood work done while you are taking this medication. ?Do not become pregnant while taking this medication or for 5 months after stopping it. Women should inform their care team if they wish to become pregnant or think they might be pregnant. There is a potential for serious harm to an unborn child. Talk to your care  team for more information. Do not breast-feed an infant while taking this medication or for 5 months after stopping it. ?What side effects may I notice from receiving this medication? ?Side effects that you should report to your care team as soon as possible: ?Allergic reactions--skin rash, itching, hives, swelling of the face, lips, tongue, or throat ?Bloody or black, tar-like stools ?Change in vision ?Chest pain ?Diarrhea ?Dry cough, shortness of breath or trouble breathing ?Eye pain ?Fast or irregular heartbeat ?Fever, chills ?High blood sugar (hyperglycemia)--increased thirst or amount of urine, unusual weakness or fatigue, blurry vision ?High thyroid levels (hyperthyroidism)--fast or irregular heartbeat, weight loss, excessive sweating or sensitivity to heat, tremors or shaking, anxiety, nervousness, irregular menstrual cycle or spotting ?Kidney injury--decrease in the amount of urine, swelling of the ankles, hands, or feet ?Liver injury--right upper belly pain, loss of appetite, nausea, light-colored stool, dark yellow or brown urine, yellowing skin or eyes, unusual weakness or fatigue ?Low red blood cell count--unusual weakness or fatigue, dizziness, headache, trouble breathing ?Low thyroid levels (hypothyroidism)--unusual weakness or fatigue, increased sensitivity to cold, constipation, hair loss, dry skin, weight gain, feelings of depression ?Mood and behavior changes-confusion, change in sex drive or performance, irritability ?Muscle pain or cramps ?Pain, tingling, or numbness in the hands or feet, muscle weakness, trouble walking, loss of balance or coordination ?  Red or dark brown urine ?Redness, blistering, peeling, or loosening of the skin, including inside the mouth ?Stomach pain ?Unusual bruising or bleeding ?Side effects that usually do not require medical attention (report to your care team if they continue or are bothersome): ?Bone pain ?Constipation ?Loss of  appetite ?Nausea ?Tiredness ?Vomiting ?This list may not describe all possible side effects. Call your doctor for medical advice about side effects. You may report side effects to FDA at 1-800-FDA-1088. ?Where should I keep my medication? ?T

## 2021-07-12 ENCOUNTER — Other Ambulatory Visit: Payer: Self-pay | Admitting: Oncology

## 2021-07-21 ENCOUNTER — Other Ambulatory Visit: Payer: Self-pay

## 2021-07-21 ENCOUNTER — Inpatient Hospital Stay (HOSPITAL_BASED_OUTPATIENT_CLINIC_OR_DEPARTMENT_OTHER): Payer: Medicaid Other | Admitting: Oncology

## 2021-07-21 ENCOUNTER — Inpatient Hospital Stay: Payer: Medicaid Other

## 2021-07-21 ENCOUNTER — Encounter: Payer: Self-pay | Admitting: Oncology

## 2021-07-21 ENCOUNTER — Other Ambulatory Visit: Payer: Self-pay | Admitting: Neurology

## 2021-07-21 VITALS — BP 156/76 | HR 67 | Temp 97.0°F | Wt 255.0 lb

## 2021-07-21 DIAGNOSIS — L80 Vitiligo: Secondary | ICD-10-CM | POA: Diagnosis not present

## 2021-07-21 DIAGNOSIS — Z95828 Presence of other vascular implants and grafts: Secondary | ICD-10-CM

## 2021-07-21 DIAGNOSIS — C438 Malignant melanoma of overlapping sites of skin: Secondary | ICD-10-CM

## 2021-07-21 DIAGNOSIS — R4189 Other symptoms and signs involving cognitive functions and awareness: Secondary | ICD-10-CM | POA: Diagnosis not present

## 2021-07-21 DIAGNOSIS — R569 Unspecified convulsions: Secondary | ICD-10-CM

## 2021-07-21 DIAGNOSIS — Z5112 Encounter for antineoplastic immunotherapy: Secondary | ICD-10-CM | POA: Diagnosis not present

## 2021-07-21 LAB — COMPREHENSIVE METABOLIC PANEL
ALT: 40 U/L (ref 0–44)
AST: 33 U/L (ref 15–41)
Albumin: 4.1 g/dL (ref 3.5–5.0)
Alkaline Phosphatase: 89 U/L (ref 38–126)
Anion gap: 7 (ref 5–15)
BUN: 26 mg/dL — ABNORMAL HIGH (ref 8–23)
CO2: 26 mmol/L (ref 22–32)
Calcium: 9.3 mg/dL (ref 8.9–10.3)
Chloride: 107 mmol/L (ref 98–111)
Creatinine, Ser: 1.32 mg/dL — ABNORMAL HIGH (ref 0.61–1.24)
GFR, Estimated: >60 mL/min (ref >60)
Glucose, Bld: 115 mg/dL — ABNORMAL HIGH (ref 70–99)
Potassium: 4.6 mmol/L (ref 3.5–5.1)
Sodium: 140 mmol/L (ref 135–145)
Total Bilirubin: 0.3 mg/dL (ref 0.3–1.2)
Total Protein: 7.4 g/dL (ref 6.5–8.1)

## 2021-07-21 LAB — CBC WITH DIFFERENTIAL/PLATELET
Abs Immature Granulocytes: 0.05 10*3/uL (ref 0.00–0.07)
Basophils Absolute: 0 10*3/uL (ref 0.0–0.1)
Basophils Relative: 1 %
Eosinophils Absolute: 0.2 10*3/uL (ref 0.0–0.5)
Eosinophils Relative: 4 %
HCT: 33.8 % — ABNORMAL LOW (ref 39.0–52.0)
Hemoglobin: 11.4 g/dL — ABNORMAL LOW (ref 13.0–17.0)
Immature Granulocytes: 1 %
Lymphocytes Relative: 23 %
Lymphs Abs: 1.3 10*3/uL (ref 0.7–4.0)
MCH: 30.3 pg (ref 26.0–34.0)
MCHC: 33.7 g/dL (ref 30.0–36.0)
MCV: 89.9 fL (ref 80.0–100.0)
Monocytes Absolute: 0.6 10*3/uL (ref 0.1–1.0)
Monocytes Relative: 11 %
Neutro Abs: 3.3 10*3/uL (ref 1.7–7.7)
Neutrophils Relative %: 60 %
Platelets: 194 10*3/uL (ref 150–400)
RBC: 3.76 MIL/uL — ABNORMAL LOW (ref 4.22–5.81)
RDW: 13.2 % (ref 11.5–15.5)
WBC: 5.4 10*3/uL (ref 4.0–10.5)
nRBC: 0 % (ref 0.0–0.2)

## 2021-07-21 MED ORDER — SODIUM CHLORIDE 0.9% FLUSH
10.0000 mL | INTRAVENOUS | Status: DC | PRN
  Filled 2021-07-21: qty 10

## 2021-07-21 MED ORDER — ALTEPLASE 2 MG IJ SOLR
2.0000 mg | Freq: Once | INTRAMUSCULAR | Status: AC | PRN
  Administered 2021-07-21: 2 mg
  Filled 2021-07-21: qty 2

## 2021-07-21 MED ORDER — LORAZEPAM 2 MG PO TABS: 2.0000 mg | ORAL_TABLET | Freq: Three times a day (TID) | ORAL | 0 refills | Status: DC | PRN

## 2021-07-21 MED ORDER — HEPARIN SOD (PORK) LOCK FLUSH 100 UNIT/ML IV SOLN
500.0000 [IU] | Freq: Once | INTRAVENOUS | Status: DC | PRN
  Filled 2021-07-21: qty 5

## 2021-07-21 MED ORDER — LIDOCAINE-PRILOCAINE 2.5-2.5 % EX CREA: 1.0000 "application " | TOPICAL_CREAM | CUTANEOUS | 11 refills | Status: DC | PRN

## 2021-07-21 MED ORDER — SODIUM CHLORIDE 0.9 % IV SOLN
240.0000 mg | Freq: Once | INTRAVENOUS | Status: AC
  Administered 2021-07-21: 240 mg via INTRAVENOUS
  Filled 2021-07-21: qty 24

## 2021-07-21 MED ORDER — SODIUM CHLORIDE 0.9 % IV SOLN
Freq: Once | INTRAVENOUS | Status: AC
  Filled 2021-07-21: qty 250

## 2021-07-21 MED ORDER — LACOSAMIDE 100 MG PO TABS: 100.0000 mg | ORAL_TABLET | Freq: Two times a day (BID) | ORAL | 3 refills | Status: DC

## 2021-07-21 NOTE — Progress Notes (Signed)
?Hematology/Oncology Progress note ?[phone] [phone] ?  ? ? ? ?Patient Care Team: ?Mechele Claude, FNP as PCP - General (Family Medicine) ?[name], MD as Consulting Physician (Oncology) ?Ventura Sellers, MD as Consulting Physician (Oncology) ?[name], MD as Consulting Physician (Cardiology) ? ?REFERRING PROVIDER: ?Mechele Claude, FNP  ?CHIEF COMPLAINTS/REASON FOR VISIT:  ?Follow up for melanoma ?HISTORY OF PRESENTING ILLNESS:  ? ?Edward Pitts is a  61 y.o.  male with PMH listed below was seen in consultation at the request of  Mechele Claude, FNP  for evaluation of inguinal mass ?Patient presented to emergency room 3 days ago complaining about left ing uinal mass discomfort. ?Reports that he has really noticed the mass growing for the past 1 months. ?He has a history of left lower extremity melanoma in 2011, status post local excision.  Pain was increased with squatting of laxation. ?He was advised to take Tylenol for pain. ?Denies any fever, chills, night sweating.  He does feel mild nauseated. ?Appetite is fair.  He has lost about 10 pounds since earlier this year. ?In the emergency room CT scan was done which showed left inguinal mass with diabetes as large as 11.6 cm.  Left inguinal and left iliac nodes which are suspicious for involvement.  There are also 2 small nonspecific hypodense lesions within the right liver, nonspecific. ? ?# patient underwent left groin mass resection on 04/16/2019. ?Resection pathology showed malignant melanoma, replacing a lymph node, with extracapsular extension, peripheral and deep margins involved.  Left inguinal contents, all 7 lymph nodes were negative for melanoma in the lymph nodes.  Extranodal melanoma identified in lymphatic and interstitium between nodes ?#07/07/2019, status post adjuvant radiation. ? ?# PDL1 80% TPS  ?# 04/16/2019. underwent left groin mass resection  ? 07/07/2019  Status post adjuvant radiation and finished  radiation  ?Patient has Mediport placed to facilitate immunotherapy treatments. ? ?06/29/2020, CT chest abdomen pelvis showed stable postoperative appearance of the left groin.  No evidence of local recurrence.  No evidence of metastatic disease in the chest abdomen or pelvis.  Hepatic steatosis.  Stable subcentimeter fluid attenuation lesion of the lateral right lobe of the liver, likely benign cyst or hemangioma.  Coronary artery disease.  Aortic atherosclerosis ? ?10/20/2020, CT chest abdomen pelvis showed stable postoperative/radiation appearance of the left groin.  No evidence of local recurrence/metastatic disease within the chest abdomen/pelvis.  Fatty liver disease.  Diverticulosis without evidence of typhlitis.  Aortic atherosclerosis ? ?03/10/2021 MRI brain without contrast showed no definitive evidence of intracranial metastatic disease.  Study is limited by absence of intravenous contrast. ? ?04/18/2021, patient establish care with neurology Dr. Mickeal Skinner for intermittent altered cognition.  he was recommended to start Vimpat.   ? ?04/26/2021, CT chest abdomen pelvis without contrast showed stable post operative changes of left groin with no evidence of recurrent disease.  No evidence of metastatic disease in the chest abdomen pelvis.  Aortic atherosclerosis. ? ?INTERVAL HISTORY ?Edward Pitts is a 61 y.o. male who has above history reviewed by me today presents for follow up visit for management of inguinal nodal recurrence of melanoma ?Patient takes Vimpat for episodes of altered cognition.he follows up with neurology.  ?Patient reports feeling well today.  No new complaints.  Denies any nausea vomiting diarrhea. ? ? ?:Review of Systems  ?Constitutional:  Negative for appetite change, chills, fatigue, fever and unexpected weight change.  ?HENT:   Negative for hearing loss and voice change.   ?Eyes:  Negative for eye problems and icterus.  ?Respiratory:  Negative for chest tightness, cough and shortness of  breath.   ?Cardiovascular:  Positive for leg swelling. Negative for chest pain.  ?Gastrointestinal:  Negative for abdominal distention and abdominal pain.  ?Endocrine: Negative for hot flashes.  ?Genitourinary:  Negative for difficulty urinating, dysuria and frequency.   ?Musculoskeletal:   ?     Status post left hip replacement  ?Skin:  Negative for itching and rash.  ?     Skin hypo-pigmentation on upper extremities, no change  ?Neurological:  Negative for light-headedness and numbness.  ?     Spells  ?Hematological:  Negative for adenopathy. Does not bruise/bleed easily.  ?Psychiatric/Behavioral:  Negative for confusion.   ? ?MEDICAL HISTORY:  ?Past Medical History:  ?Diagnosis Date  ? Anemia   ? iron treatments  ? Anxiety   ? Aortic atherosclerosis ([place])   ? Arthritis   ? 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 left groin, resected, radiation  ? Cataract   ? Complication of anesthesia   ? PONV  ? Coronary artery disease   ? Dizziness of unknown etiology   ? has led to seizures and passing out.  ? Family history of adverse reaction to anesthesia   ? PONV mother  ? GERD (gastroesophageal reflux disease)   ? History of complete heart block   ? PPM placed  ? Hyperlipidemia   ? Hypertension   ? LBBB (left bundle branch block)   ? Lymphedema of left leg   ? uses thigh high compression stockings  ? Melanoma ([place]) 2012  ? skin cancer, left thigh  ? OSA on CPAP   ? PONV (postoperative nausea and vomiting) 04/16/2019  ? Port-A-Cath in place   ? RIGHT chest wall  ? Presence of cardiac pacemaker   ? [employer]  ? Seizures ([place])   ? still has episodes of dizziness. last event 1 month ago (march 2022) and will pass out. takes clonazepam  ? ? ?SURGICAL HISTORY: ?Past Surgical History:  ?Procedure Laterality Date  ? CT RADIATION THERAPY GUIDE    ? left groin  ? dental implant    ? permanent implant  ? KNEE SURGERY Left   ? arthroscopy  ? LEFT HEART CATH AND CORONARY ANGIOGRAPHY Left 06/29/2017  ? Procedure: LEFT HEART CATH AND CORONARY  ANGIOGRAPHY;  Surgeon: [name], MD;  Location: [facility];  Service: Cardiovascular;  Laterality: Left;  ? LYMPH NODE DISSECTION Left 04/16/2019  ? Procedure: Left inguinal Lymph Node Dissection;  Surgeon: [name], MD;  Location: [facility];  Service: General;  Laterality: Left;  ? MELANOMA EXCISION Left 04/16/2019  ? Procedure: MELANOMA EXCISION LEFT GROIN MASS;  Surgeon: [name], MD;  Location: [facility];  Service: General;  Laterality: Left;  ? MELANOMA EXCISION WITH SENTINEL LYMPH NODE BIOPSY Left 2012  ? Left calf   ? PACEMAKER INSERTION N/A 08/26/2018  ? Procedure: INSERTION PACEMAKER;  Surgeon: [name], MD;  Location: [facility];  Service: Cardiovascular;  Laterality: N/A;  ? PORTA CATH INSERTION N/A 08/26/2019  ? Procedure: PORTA CATH INSERTION;  Surgeon: Katha Cabal, MD;  Location: [facility];  Service: Cardiovascular;  Laterality: N/A;  ? SUPERFICIAL LYMPH NODE BIOPSY / EXCISION Left 2020  ? lymph nodes removed around left groin melanoma site  ? TEMPORARY PACEMAKER N/A 08/25/2018  ? Procedure: TEMPORARY PACEMAKER;  Surgeon: [name], MD;  Location: [facility];  Service: Cardiovascular;  Laterality:  N/A;  ? TOTAL HIP ARTHROPLASTY Left 07/14/2020  ? Procedure: TOTAL HIP ARTHROPLASTY;  Surgeon: Dereck Leep, MD;  Location: [facility];  Service: Orthopedics;  Laterality: Left;  ? ? ?SOCIAL HISTORY: ?Social History  ? ?Socioeconomic History  ? Marital status: Married  ?  Spouse name: Vicente Males   ? Number of children: 7  ? Years of education: 27  ? Highest education level: Not on file  ?Occupational History  ?  Comment: disability  ?Tobacco Use  ? Smoking status: Never  ? Smokeless tobacco: Never  ?Vaping Use  ? Vaping Use: Never used  ?Substance and Sexual Activity  ? Alcohol use: No  ? Drug use: No  ? Sexual activity: Not Currently  ?Other Topics Concern  ? Not on file  ?Social History Narrative  ? Lives with  Wife,  ? Has 2 small dogs  ? Caffeine use:  sodas (2 per day)  ?   ? Out of work on disability.  Has a walk in shower. No stairs to climb  ? Oncology treatment ongoing. Uses port a cath for treatment.  ?   ? pacemaker  ? ?Social Determinants of Health  ? ?F

## 2021-07-21 NOTE — Patient Instructions (Signed)
[facility]  Discharge Instructions: ?Thank you for choosing [facility] to provide your oncology and hematology care.  ?If you have a lab appointment with the Cancer Center, please go directly to the Cancer Center and check in at the registration area. ? ?Wear comfortable clothing and clothing appropriate for easy access to any Portacath or PICC line.  ? ?We strive to give you quality time with your provider. You may need to reschedule your appointment if you arrive late (15 or more minutes).  Arriving late affects you and other patients whose appointments are after yours.  Also, if you miss three or more appointments without notifying the office, you may be dismissed from the clinic at the provider?s discretion.    ?  ?For prescription refill requests, have your pharmacy contact our office and allow 72 hours for refills to be completed.   ? ?Today you received the following chemotherapy and/or immunotherapy agents OPDIVO    ?  ?To help prevent nausea and vomiting after your treatment, we encourage you to take your nausea medication as directed. ? ?BELOW ARE SYMPTOMS THAT SHOULD BE REPORTED IMMEDIATELY: ?*FEVER GREATER THAN 100.4 F (38 ?C) OR HIGHER ?*CHILLS OR SWEATING ?*NAUSEA AND VOMITING THAT IS NOT CONTROLLED WITH YOUR NAUSEA MEDICATION ?*UNUSUAL SHORTNESS OF BREATH ?*UNUSUAL BRUISING OR BLEEDING ?*URINARY PROBLEMS (pain or burning when urinating, or frequent urination) ?*BOWEL PROBLEMS (unusual diarrhea, constipation, pain near the anus) ?TENDERNESS IN MOUTH AND THROAT WITH OR WITHOUT PRESENCE OF ULCERS (sore throat, sores in mouth, or a toothache) ?UNUSUAL RASH, SWELLING OR PAIN  ?UNUSUAL VAGINAL DISCHARGE OR ITCHING  ? ?Items with * indicate a potential emergency and should be followed up as soon as possible or go to the Emergency Department if any problems should occur. ? ?Please show the CHEMOTHERAPY ALERT CARD or IMMUNOTHERAPY ALERT CARD at check-in to the  Emergency Department and triage nurse. ? ?Should you have questions after your visit or need to cancel or reschedule your appointment, please contact [facility]  [phone] and follow the prompts.  Office hours are 8:00 a.m. to 4:30 p.m. Monday - Friday. Please note that voicemails left after 4:00 p.m. may not be returned until the following business day.  We are closed weekends and major holidays. You have access to a nurse at all times for urgent questions. Please call the main number to the clinic [phone] and follow the prompts. ? ?For any non-urgent questions, you may also contact your provider using MyChart. We now offer e-Visits for anyone 18 and older to request care online for non-urgent symptoms. For details visit [email or web address]. ?  ?Also download the MyChart app! Go to the app store, search "MyChart", open the app, select [facility], and log in with your MyChart username and password. ? ?Due to Covid, a mask is required upon entering the hospital/clinic. If you do not have a mask, one will be given to you upon arrival. For doctor visits, patients may have 1 support person aged 18 or older with them. For treatment visits, patients cannot have anyone with them due to current Covid guidelines and our immunocompromised population.  ? ?Nivolumab injection ?What is this medication? ?NIVOLUMAB (nye VOL ue mab) is a monoclonal antibody. It treats certain types of cancer. Some of the cancers treated are colon cancer, head and neck cancer, Hodgkin lymphoma, lung cancer, and melanoma. ?This medicine may be used for other purposes; ask your health care provider or pharmacist if   you have questions. ?COMMON BRAND NAME(S): Opdivo ?What should I tell my care team before I take this medication? ?They need to know if you have any of these conditions: ?Autoimmune diseases such as Crohn's disease, ulcerative colitis, or lupus ?Have had or planning to have an allogeneic stem  cell transplant (uses someone else's stem cells) ?History of chest radiation ?Organ transplant ?Nervous system problems such as myasthenia gravis or Guillain-Barre syndrome ?An unusual or allergic reaction to nivolumab, other medicines, foods, dyes, or preservatives ?Pregnant or trying to get pregnant ?Breast-feeding ?How should I use this medication? ?This medication is injected into a vein. It is given in a hospital or clinic setting. ?A special MedGuide will be given to you before each treatment. Be sure to read this information carefully each time. ?Talk to your care team regarding the use of this medication in children. While it may be prescribed for children as young as 12 years for selected conditions, precautions do apply. ?Overdosage: If you think you have taken too much of this medicine contact a poison control center or emergency room at once. ?NOTE: This medicine is only for you. Do not share this medicine with others. ?What if I miss a dose? ?Keep appointments for follow-up doses. It is important not to miss your dose. Call your care team if you are unable to keep an appointment. ?What may interact with this medication? ?Interactions have not been studied. ?This list may not describe all possible interactions. Give your health care provider a list of all the medicines, herbs, non-prescription drugs, or dietary supplements you use. Also tell them if you smoke, drink alcohol, or use illegal drugs. Some items may interact with your medicine. ?What should I watch for while using this medication? ?Your condition will be monitored carefully while you are receiving this medication. ?You may need blood work done while you are taking this medication. ?Do not become pregnant while taking this medication or for 5 months after stopping it. Women should inform their care team if they wish to become pregnant or think they might be pregnant. There is a potential for serious harm to an unborn child. Talk to your care  team for more information. Do not breast-feed an infant while taking this medication or for 5 months after stopping it. ?What side effects may I notice from receiving this medication? ?Side effects that you should report to your care team as soon as possible: ?Allergic reactions--skin rash, itching, hives, swelling of the face, lips, tongue, or throat ?Bloody or black, tar-like stools ?Change in vision ?Chest pain ?Diarrhea ?Dry cough, shortness of breath or trouble breathing ?Eye pain ?Fast or irregular heartbeat ?Fever, chills ?High blood sugar (hyperglycemia)--increased thirst or amount of urine, unusual weakness or fatigue, blurry vision ?High thyroid levels (hyperthyroidism)--fast or irregular heartbeat, weight loss, excessive sweating or sensitivity to heat, tremors or shaking, anxiety, nervousness, irregular menstrual cycle or spotting ?Kidney injury--decrease in the amount of urine, swelling of the ankles, hands, or feet ?Liver injury--right upper belly pain, loss of appetite, nausea, light-colored stool, dark yellow or brown urine, yellowing skin or eyes, unusual weakness or fatigue ?Low red blood cell count--unusual weakness or fatigue, dizziness, headache, trouble breathing ?Low thyroid levels (hypothyroidism)--unusual weakness or fatigue, increased sensitivity to cold, constipation, hair loss, dry skin, weight gain, feelings of depression ?Mood and behavior changes-confusion, change in sex drive or performance, irritability ?Muscle pain or cramps ?Pain, tingling, or numbness in the hands or feet, muscle weakness, trouble walking, loss of balance or coordination ?  Red or dark brown urine ?Redness, blistering, peeling, or loosening of the skin, including inside the mouth ?Stomach pain ?Unusual bruising or bleeding ?Side effects that usually do not require medical attention (report to your care team if they continue or are bothersome): ?Bone pain ?Constipation ?Loss of  appetite ?Nausea ?Tiredness ?Vomiting ?This list may not describe all possible side effects. Call your doctor for medical advice about side effects. You may report side effects to FDA at 1-800-FDA-1088. ?Where should I keep my medication? ?T

## 2021-07-21 NOTE — Telephone Encounter (Signed)
Verify Drug Registry For Clonazepam 0.5 Mg Tablet ?Last Filled: 06/09/2021 ?Quantity: 90 tablets for 30 days ?Last appointment: 06/21/2021 ?Next appointment: N/A ? ?On 06/10/2021 patient also filled Lorazepam 2 Mg Tablet 20 tablets for 7 days. This was prescribed by Durenda Hurt, MD [place], [place] [address]. ? ?

## 2021-07-21 NOTE — Telephone Encounter (Signed)
I got a refill request for clonazepam, I have never seen this patient before, is apparently on clonazepam for seizure prevention.  I will refill for 1 month since Dr. April Manson is out of the office, we can revisit at next refill whether to continue, he is now on Vimpat.  There is another prescription for lorazepam by, according to Dr. Renda Rolls note is supposed to be as rescue for aura or prolonged seizures. Dr. Jannifer Franklin started the clonazepam. ? ? ?Copied Dr. Mickeal Skinner 06/10/2021: Recommended continuing Vimpat '100mg'$  BID, Clonazepam 0.25/0.5 for seizure prevention. ? ?Meds ordered this encounter  ?Medications  ? clonazePAM (KLONOPIN) 0.5 MG tablet  ?  Sig: 1 TABLET IN THE MORNING, 2 IN THE EVENING  ?  Dispense:  90 tablet  ?  Refill:  0  ? ? ?

## 2021-07-21 NOTE — Telephone Encounter (Signed)
Received Fax from [place], [place] Fax: [phone] requesting that patient gave verbal consent to transfer prescriptions to their pharmacy. Returned fax, signed by Dr. Mickeal Skinner,  to refill Vimpat and Ativan. Original documents to be scanned into chart. ?

## 2021-07-25 ENCOUNTER — Other Ambulatory Visit: Payer: Self-pay | Admitting: Neurology

## 2021-07-26 ENCOUNTER — Other Ambulatory Visit: Payer: Self-pay | Admitting: Internal Medicine

## 2021-07-26 DIAGNOSIS — R569 Unspecified convulsions: Secondary | ICD-10-CM

## 2021-07-26 MED ORDER — LACOSAMIDE 100 MG PO TABS: 100.0000 mg | ORAL_TABLET | Freq: Two times a day (BID) | ORAL | 3 refills | Status: DC

## 2021-07-26 MED ORDER — LORAZEPAM 2 MG PO TABS: 2.0000 mg | ORAL_TABLET | Freq: Three times a day (TID) | ORAL | 0 refills | Status: DC | PRN

## 2021-08-05 ENCOUNTER — Encounter: Payer: Self-pay | Admitting: Oncology

## 2021-08-05 ENCOUNTER — Emergency Department

## 2021-08-05 ENCOUNTER — Other Ambulatory Visit: Payer: Self-pay

## 2021-08-05 ENCOUNTER — Inpatient Hospital Stay
Admission: EM | Admit: 2021-08-05 | Discharge: 2021-08-08 | DRG: 281 | Disposition: A | Discharge status: Other Acute Inpatient Hospital | Attending: Internal Medicine | Admitting: Internal Medicine

## 2021-08-05 DIAGNOSIS — R0789 Other chest pain: Secondary | ICD-10-CM | POA: Diagnosis present

## 2021-08-05 DIAGNOSIS — C774 Secondary and unspecified malignant neoplasm of inguinal and lower limb lymph nodes: Secondary | ICD-10-CM | POA: Diagnosis present

## 2021-08-05 DIAGNOSIS — Z950 Presence of cardiac pacemaker: Secondary | ICD-10-CM | POA: Diagnosis not present

## 2021-08-05 DIAGNOSIS — Z6839 Body mass index (BMI) 39.0-39.9, adult: Secondary | ICD-10-CM

## 2021-08-05 DIAGNOSIS — E785 Hyperlipidemia, unspecified: Secondary | ICD-10-CM | POA: Diagnosis present

## 2021-08-05 DIAGNOSIS — R350 Frequency of micturition: Secondary | ICD-10-CM | POA: Diagnosis present

## 2021-08-05 DIAGNOSIS — Z79899 Other long term (current) drug therapy: Secondary | ICD-10-CM | POA: Diagnosis not present

## 2021-08-05 DIAGNOSIS — I214 Non-ST elevation (NSTEMI) myocardial infarction: Secondary | ICD-10-CM | POA: Diagnosis present

## 2021-08-05 DIAGNOSIS — G40909 Epilepsy, unspecified, not intractable, without status epilepticus: Secondary | ICD-10-CM | POA: Diagnosis present

## 2021-08-05 DIAGNOSIS — K219 Gastro-esophageal reflux disease without esophagitis: Secondary | ICD-10-CM | POA: Diagnosis present

## 2021-08-05 DIAGNOSIS — I442 Atrioventricular block, complete: Secondary | ICD-10-CM | POA: Diagnosis present

## 2021-08-05 DIAGNOSIS — C779 Secondary and unspecified malignant neoplasm of lymph node, unspecified: Secondary | ICD-10-CM | POA: Diagnosis present

## 2021-08-05 DIAGNOSIS — I25110 Atherosclerotic heart disease of native coronary artery with unstable angina pectoris: Secondary | ICD-10-CM | POA: Diagnosis not present

## 2021-08-05 DIAGNOSIS — N1831 Chronic kidney disease, stage 3a: Secondary | ICD-10-CM | POA: Diagnosis present

## 2021-08-05 DIAGNOSIS — I2510 Atherosclerotic heart disease of native coronary artery without angina pectoris: Secondary | ICD-10-CM | POA: Diagnosis present

## 2021-08-05 DIAGNOSIS — Z809 Family history of malignant neoplasm, unspecified: Secondary | ICD-10-CM | POA: Diagnosis not present

## 2021-08-05 DIAGNOSIS — R778 Other specified abnormalities of plasma proteins: Secondary | ICD-10-CM

## 2021-08-05 DIAGNOSIS — Z85820 Personal history of malignant melanoma of skin: Secondary | ICD-10-CM | POA: Diagnosis not present

## 2021-08-05 DIAGNOSIS — F419 Anxiety disorder, unspecified: Secondary | ICD-10-CM | POA: Diagnosis present

## 2021-08-05 DIAGNOSIS — E66813 Obesity, class 3: Secondary | ICD-10-CM | POA: Diagnosis present

## 2021-08-05 DIAGNOSIS — I25118 Atherosclerotic heart disease of native coronary artery with other forms of angina pectoris: Secondary | ICD-10-CM | POA: Diagnosis not present

## 2021-08-05 DIAGNOSIS — Z7982 Long term (current) use of aspirin: Secondary | ICD-10-CM | POA: Diagnosis not present

## 2021-08-05 DIAGNOSIS — Z923 Personal history of irradiation: Secondary | ICD-10-CM

## 2021-08-05 DIAGNOSIS — Z96642 Presence of left artificial hip joint: Secondary | ICD-10-CM | POA: Diagnosis present

## 2021-08-05 DIAGNOSIS — R569 Unspecified convulsions: Secondary | ICD-10-CM

## 2021-08-05 DIAGNOSIS — Z01810 Encounter for preprocedural cardiovascular examination: Secondary | ICD-10-CM | POA: Diagnosis not present

## 2021-08-05 DIAGNOSIS — R079 Chest pain, unspecified: Secondary | ICD-10-CM | POA: Insufficient documentation

## 2021-08-05 DIAGNOSIS — I129 Hypertensive chronic kidney disease with stage 1 through stage 4 chronic kidney disease, or unspecified chronic kidney disease: Secondary | ICD-10-CM | POA: Diagnosis present

## 2021-08-05 DIAGNOSIS — C439 Malignant melanoma of skin, unspecified: Secondary | ICD-10-CM | POA: Diagnosis present

## 2021-08-05 LAB — COMPREHENSIVE METABOLIC PANEL
ALT: 37 U/L (ref 0–44)
AST: 35 U/L (ref 15–41)
Albumin: 4.1 g/dL (ref 3.5–5.0)
Alkaline Phosphatase: 93 U/L (ref 38–126)
Anion gap: 5 (ref 5–15)
BUN: 31 mg/dL — ABNORMAL HIGH (ref 8–23)
CO2: 24 mmol/L (ref 22–32)
Calcium: 9 mg/dL (ref 8.9–10.3)
Chloride: 112 mmol/L — ABNORMAL HIGH (ref 98–111)
Creatinine, Ser: 1.57 mg/dL — ABNORMAL HIGH (ref 0.61–1.24)
GFR, Estimated: 50 mL/min — ABNORMAL LOW (ref >60)
Glucose, Bld: 118 mg/dL — ABNORMAL HIGH (ref 70–99)
Potassium: 4.1 mmol/L (ref 3.5–5.1)
Sodium: 141 mmol/L (ref 135–145)
Total Bilirubin: 0.9 mg/dL (ref 0.3–1.2)
Total Protein: 7.3 g/dL (ref 6.5–8.1)

## 2021-08-05 LAB — CBC
HCT: 33.1 % — ABNORMAL LOW (ref 39.0–52.0)
Hemoglobin: 11 g/dL — ABNORMAL LOW (ref 13.0–17.0)
MCH: 29.8 pg (ref 26.0–34.0)
MCHC: 33.2 g/dL (ref 30.0–36.0)
MCV: 89.7 fL (ref 80.0–100.0)
Platelets: 208 10*3/uL (ref 150–400)
RBC: 3.69 MIL/uL — ABNORMAL LOW (ref 4.22–5.81)
RDW: 13.2 % (ref 11.5–15.5)
WBC: 5.1 10*3/uL (ref 4.0–10.5)
nRBC: 0 % (ref 0.0–0.2)

## 2021-08-05 LAB — TROPONIN I (HIGH SENSITIVITY)
Troponin I (High Sensitivity): 23 ng/L — ABNORMAL HIGH (ref <18)
Troponin I (High Sensitivity): 182 ng/L (ref <18)

## 2021-08-05 LAB — HEPARIN LEVEL (UNFRACTIONATED): Heparin Unfractionated: 0.25 IU/mL — ABNORMAL LOW (ref 0.30–0.70)

## 2021-08-05 MED ORDER — LACOSAMIDE 50 MG PO TABS
100.0000 mg | ORAL_TABLET | Freq: Two times a day (BID) | ORAL | Status: DC
  Administered 2021-08-05 – 2021-08-08 (×6): 100 mg via ORAL
  Filled 2021-08-05 (×6): qty 2

## 2021-08-05 MED ORDER — ASPIRIN 81 MG PO CHEW
81.0000 mg | CHEWABLE_TABLET | Freq: Every day | ORAL | Status: DC
Start: 2021-08-06 — End: 2021-08-05

## 2021-08-05 MED ORDER — LISINOPRIL 20 MG PO TABS
20.0000 mg | ORAL_TABLET | Freq: Every day | ORAL | Status: DC
  Administered 2021-08-06 – 2021-08-08 (×3): 20 mg via ORAL
  Filled 2021-08-05: qty 1
  Filled 2021-08-05: qty 2
  Filled 2021-08-05: qty 1

## 2021-08-05 MED ORDER — ALLOPURINOL 100 MG PO TABS
300.0000 mg | ORAL_TABLET | Freq: Every day | ORAL | Status: DC
  Administered 2021-08-06 – 2021-08-08 (×3): 300 mg via ORAL
  Filled 2021-08-05: qty 1
  Filled 2021-08-05 (×2): qty 3

## 2021-08-05 MED ORDER — HEPARIN (PORCINE) 25000 UT/250ML-% IV SOLN
1700.0000 [IU]/h | INTRAVENOUS | Status: DC
  Administered 2021-08-05: 1300 [IU]/h via INTRAVENOUS
  Administered 2021-08-06: 1450 [IU]/h via INTRAVENOUS
  Administered 2021-08-06 – 2021-08-07 (×2): 1550 [IU]/h via INTRAVENOUS
  Administered 2021-08-08 (×2): 1700 [IU]/h via INTRAVENOUS
  Filled 2021-08-05 (×5): qty 250

## 2021-08-05 MED ORDER — ASPIRIN EC 81 MG PO TBEC
81.0000 mg | DELAYED_RELEASE_TABLET | Freq: Every day | ORAL | Status: DC
  Administered 2021-08-06 – 2021-08-07 (×2): 81 mg via ORAL
  Filled 2021-08-05 (×2): qty 1

## 2021-08-05 MED ORDER — ATORVASTATIN CALCIUM 20 MG PO TABS
20.0000 mg | ORAL_TABLET | Freq: Every day | ORAL | Status: DC
Start: 2021-08-06 — End: 2021-08-08
  Administered 2021-08-06 – 2021-08-08 (×3): 20 mg via ORAL
  Filled 2021-08-05 (×3): qty 1

## 2021-08-05 MED ORDER — CLONAZEPAM 0.5 MG PO TABS
0.5000 mg | ORAL_TABLET | Freq: Two times a day (BID) | ORAL | Status: DC
  Administered 2021-08-05 – 2021-08-08 (×6): 0.5 mg via ORAL
  Filled 2021-08-05 (×6): qty 1

## 2021-08-05 MED ORDER — SODIUM CHLORIDE 0.9 % IV SOLN: 250.0000 mL | INTRAVENOUS | Status: DC | PRN

## 2021-08-05 MED ORDER — SODIUM CHLORIDE 0.9% FLUSH: 3.0000 mL | INTRAVENOUS | Status: DC | PRN

## 2021-08-05 MED ORDER — HEPARIN BOLUS VIA INFUSION
4000.0000 [IU] | Freq: Once | INTRAVENOUS | Status: AC
  Administered 2021-08-05: 4000 [IU] via INTRAVENOUS
  Filled 2021-08-05: qty 4000

## 2021-08-05 MED ORDER — ONDANSETRON HCL 4 MG/2ML IJ SOLN: 4.0000 mg | Freq: Four times a day (QID) | INTRAMUSCULAR | Status: DC | PRN

## 2021-08-05 MED ORDER — PANTOPRAZOLE SODIUM 40 MG PO TBEC
40.0000 mg | DELAYED_RELEASE_TABLET | Freq: Every day | ORAL | Status: DC
Start: 2021-08-06 — End: 2021-08-08
  Administered 2021-08-06 – 2021-08-08 (×3): 40 mg via ORAL
  Filled 2021-08-05 (×3): qty 1

## 2021-08-05 MED ORDER — ADULT MULTIVITAMIN W/MINERALS CH
1.0000 | ORAL_TABLET | Freq: Every day | ORAL | Status: DC
  Administered 2021-08-06 – 2021-08-08 (×3): 1 via ORAL
  Filled 2021-08-05 (×3): qty 1

## 2021-08-05 MED ORDER — SODIUM CHLORIDE 0.9% FLUSH
3.0000 mL | Freq: Two times a day (BID) | INTRAVENOUS | Status: DC
  Administered 2021-08-06 – 2021-08-07 (×3): 3 mL via INTRAVENOUS

## 2021-08-05 MED ORDER — ACETAMINOPHEN 325 MG PO TABS: 650.0000 mg | ORAL_TABLET | ORAL | Status: DC | PRN

## 2021-08-05 MED ORDER — METOPROLOL SUCCINATE ER 25 MG PO TB24
25.0000 mg | ORAL_TABLET | Freq: Every day | ORAL | Status: DC
  Administered 2021-08-06 – 2021-08-08 (×3): 25 mg via ORAL
  Filled 2021-08-05 (×3): qty 1

## 2021-08-05 MED ORDER — HEPARIN BOLUS VIA INFUSION
1400.0000 [IU] | Freq: Once | INTRAVENOUS | Status: AC
  Administered 2021-08-05: 1400 [IU] via INTRAVENOUS
  Filled 2021-08-05: qty 1400

## 2021-08-05 MED ORDER — NITROGLYCERIN 0.4 MG SL SUBL: 0.4000 mg | SUBLINGUAL_TABLET | SUBLINGUAL | Status: DC | PRN

## 2021-08-05 NOTE — ED Notes (Signed)
Pt assisted to stand at bedside to use urinal. Pt stating that they are lightheaded upon sitting back onto bed. Pt given sandwich and water to drink. Pt denies any other needs at this time. ?

## 2021-08-05 NOTE — ED Notes (Signed)
Pt placed in hospital bed

## 2021-08-05 NOTE — ED Notes (Signed)
PT endorsing they sleep with a cpap. RT Paged at this time.  ?

## 2021-08-05 NOTE — ED Notes (Signed)
Pt endorsing increased chest pain at this time with shortness of breath. Vss at this time. ?

## 2021-08-05 NOTE — Assessment & Plan Note (Addendum)
Patient with a history of two-vessel coronary artery disease was admitted for chest pain with radiation to his left arm associated with nausea. ?Twelve-lead EKG did not show any acute findings but patient has an uptrending troponin level ?Continue heparin drip ?Continue metoprolol, aspirin and atorvastatin ?Pending 2D echocardiogram to assess LVEF and rule out regional wall motion abnormality ?cardiology input appreciated.  May need cath on Monday ?

## 2021-08-05 NOTE — ED Notes (Signed)
Pt assisted to toilet. Hospital bed ordered for patient ? ?

## 2021-08-05 NOTE — ED Notes (Signed)
Received report from Julia RN. ?

## 2021-08-05 NOTE — ED Notes (Signed)
EKG provided to MD Kinner for interpretation ?

## 2021-08-05 NOTE — Assessment & Plan Note (Addendum)
At baseline 

## 2021-08-05 NOTE — Consult Note (Signed)
? ?[facility] Card[facility] Consultation Note  ?Patient ID: Edward Pitts, MRN: [id number], DOB/AGE: 1960-08-22 61 y.o. ?Admit date: 08/05/2021   Date of Consult: 08/05/2021 ?Primary Physician: Mechele Claude, FNP ?Primary Cardiologist: Nehemiah Massed ? ?Chief Complaint:  ?Chief Complaint  ?Patient presents with  ?? Chest Pain  ? ?Reason for Consult:  Chest pain ? ?HPI: 61 y.o. male with known coronary artery atherosclerosis with minimal atherosclerosis of his left main and right coronary artery by cardiac catheterization in 2019.  At that time the patient did have heart block and bundle branch block for which she received a pacemaker.  Currently he is having pacemaker without any issues at all.  The patient has been on appropriate medication management for further risk reduction when he had chest pain while mowing the lawn.  He was sick nauseated and feel weak and diaphoretic.  He then was seen in the emergency room at which time he had relief with nitroglycerin and other treatment.  Currently he is pain-free and feeling much better but has an elevation troponin of 63/182.  EKG shows AV sequential pacing.  In addition to that the patient otherwise has hemodynamic stability. ? ?Past Medical History:  ?Diagnosis Date  ?? Anemia   ? iron treatments  ?? Anxiety   ?? Aortic atherosclerosis ([place])   ?? Arthritis   ?? 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 left groin, resected, radiation  ?? Cataract   ?? Complication of anesthesia   ? PONV  ?? Coronary artery disease   ?? Dizziness of unknown etiology   ? has led to seizures and passing out.  ?? Family history of adverse reaction to anesthesia   ? PONV mother  ?? GERD (gastroesophageal reflux disease)   ?? History of complete heart block   ? PPM placed  ?? Hyperlipidemia   ?? Hypertension   ?? LBBB (left bundle branch block)   ?? Lymphedema of left leg   ? uses thigh high compression stockings  ?? Melanoma ([place]) 2012  ? skin cancer, left thigh  ?? OSA on CPAP   ?? PONV (postoperative  nausea and vomiting) 04/16/2019  ?? Port-A-Cath in place   ? RIGHT chest wall  ?? Presence of cardiac pacemaker   ? [employer]  ?? Seizures ([place])   ? still has episodes of dizziness. last event 1 month ago (march 2022) and will pass out. takes clonazepam  ?   ? ?Surgical History:  ?Past Surgical History:  ?Procedure Laterality Date  ?? CT RADIATION THERAPY GUIDE    ? left groin  ?? dental implant    ? permanent implant  ?? KNEE SURGERY Left   ? arthroscopy  ?? LEFT HEART CATH AND CORONARY ANGIOGRAPHY Left 06/29/2017  ? Procedure: LEFT HEART CATH AND CORONARY ANGIOGRAPHY;  Surgeon: [name], MD;  Location: [facility];  Service: Cardiovascular;  Laterality: Left;  ?? LYMPH NODE DISSECTION Left 04/16/2019  ? Procedure: Left inguinal Lymph Node Dissection;  Surgeon: [name], MD;  Location: [facility];  Service: General;  Laterality: Left;  ?? MELANOMA EXCISION Left 04/16/2019  ? Procedure: MELANOMA EXCISION LEFT GROIN MASS;  Surgeon: [name], MD;  Location: [facility];  Service: General;  Laterality: Left;  ?? MELANOMA EXCISION WITH SENTINEL LYMPH NODE BIOPSY Left 2012  ? Left calf   ?? PACEMAKER INSERTION N/A 08/26/2018  ? Procedure: INSERTION PACEMAKER;  Surgeon: [name], MD;  Location: [facility];  Service: Cardiovascular;  Laterality: N/A;  ?? PORTA CATH INSERTION  N/A 08/26/2019  ? Procedure: PORTA CATH INSERTION;  Surgeon: Katha Cabal, MD;  Location: [facility];  Service: Cardiovascular;  Laterality: N/A;  ?? SUPERFICIAL LYMPH NODE BIOPSY / EXCISION Left 2020  ? lymph nodes removed around left groin melanoma site  ?? TEMPORARY PACEMAKER N/A 08/25/2018  ? Procedure: TEMPORARY PACEMAKER;  Surgeon: [name], MD;  Location: [facility];  Service: Cardiovascular;  Laterality: N/A;  ?? TOTAL HIP ARTHROPLASTY Left 07/14/2020  ? Procedure: TOTAL HIP ARTHROPLASTY;  Surgeon: Dereck Leep, MD;  Location: [facility];  Service: Orthopedics;  Laterality: Left;  ?  ? ?Home  Meds: ?Prior to Admission medications   ?Medication Sig Start Date End Date Taking? Authorizing Provider  ?allopurinol (ZYLOPRIM) 300 MG tablet Take 300 mg by mouth daily. 05/24/21  Yes [provider]  ?aspirin 81 MG EC tablet Take 81 mg by mouth daily.   Yes [provider]  ?atorvastatin (LIPITOR) 20 MG tablet Take 20 mg by mouth daily. 07/15/18  Yes [provider]  ?clonazePAM (KLONOPIN) 0.5 MG tablet 1 TABLET IN THE MORNING, 2 IN THE EVENING ?Patient taking differently: Take 0.5-1 mg by mouth 2 (two) times daily. 1 tablet in the morning, 2 in the evening 07/21/21  Yes Suzzanne Cloud, NP  ?ketoconazole (NIZORAL) 2 % cream Apply 1 application topically daily.   Yes [provider]  ?Lacosamide (VIMPAT) 100 MG TABS Take 1 tablet (100 mg total) by mouth in the morning and at bedtime. 07/26/21  Yes Vaslow, Acey Lav, MD  ?lisinopril (ZESTRIL) 20 MG tablet Take 20 mg by mouth daily.   Yes [provider]  ?metoprolol succinate (TOPROL-XL) 25 MG 24 hr tablet Take 25 mg by mouth daily.  12/15/18  Yes [provider]  ?Multiple Vitamin (MULTIVITAMIN WITH MINERALS) TABS tablet Take 1 tablet by mouth daily. Centrum Silver   Yes [provider]  ?omeprazole (PRILOSEC) 20 MG capsule TAKE 1 CAPSULE BY MOUTH EVERY DAY ?Patient taking differently: Take 20 mg by mouth daily. 07/12/21  Yes [name], MD  ?hydrocortisone 2.5 % ointment Apply topically 2 (two) times daily.    [provider]  ?lidocaine-prilocaine (EMLA) cream Apply 1 application. topically as needed. Apply small amount of cream to port site approx 1-2 hours prior to appointment. 07/21/21   [name], MD  ?LORazepam (ATIVAN) 2 MG tablet Take 1 tablet (2 mg total) by mouth every 8 (eight) hours as needed for seizure. 07/26/21   Ventura Sellers, MD  ?nystatin (MYCOSTATIN/NYSTOP) powder Apply 1 application topically 3 (three) times daily. Apply small amount to affected area 3 times a day until  healed ?Patient not taking: Reported on 08/05/2021 11/30/20   [name], MD  ?ondansetron (ZOFRAN) 4 MG tablet TAKE 1 TABLET BY MOUTH EVERY 8 HOURS AS NEEDED FOR NAUSEA AND VOMITING ?Patient taking differently: Take 4 mg by mouth every 8 (eight) hours as needed for vomiting or nausea. 04/11/21   [name], MD  ? ? ?Inpatient Medications:  ?? allopurinol  300 mg Oral Daily  ?? [START ON 08/06/2021] aspirin EC  81 mg Oral Daily  ?? atorvastatin  20 mg Oral Daily  ?? clonazePAM  0.5 mg Oral BID  ?? Lacosamide  100 mg Oral BID  ?? lisinopril  20 mg Oral Daily  ?? metoprolol succinate  25 mg Oral Daily  ?? multivitamin with minerals  1 tablet Oral Daily  ?? pantoprazole  40 mg Oral Daily  ?? sodium  chloride flush  3 mL Intravenous Q12H  ? ?? sodium chloride    ?? heparin 1,300 Units/hr (08/05/21 1503)  ? ? ?Allergies:  ?Allergies  ?Allergen Reactions  ?? Ibuprofen Other (See Comments)  ?  Affects kidneys  ?? Levetiracetam   ?  Other reaction(s): Dizziness, Headache  ?? Nsaids   ?  Affects kidneys  ? ? ?Social History  ? ?Socioeconomic History  ?? Marital status: Married  ?  Spouse name: Vicente Males   ?? Number of children: 7  ?? Years of education: 61  ?? Highest education level: Not on file  ?Occupational History  ?  Comment: disability  ?Tobacco Use  ?? Smoking status: Never  ?? Smokeless tobacco: Never  ?Vaping Use  ?? Vaping Use: Never used  ?Substance and Sexual Activity  ?? Alcohol use: No  ?? Drug use: No  ?? Sexual activity: Not Currently  ?Other Topics Concern  ?? Not on file  ?Social History Narrative  ? Lives with  Wife,  ? Has 2 small dogs  ? Caffeine use: sodas (2 per day)  ?   ? Out of work on disability.  Has a walk in shower. No stairs to climb  ? Oncology treatment ongoing. Uses port a cath for treatment.  ?   ? pacemaker  ? ?Social Determinants of Health  ? ?Financial Resource Strain: Not on file  ?Food Insecurity: Not on file  ?Transportation Needs: Not on file  ?Physical Activity: Not on file  ?Stress: Not on file   ?Social Connections: Not on file  ?Intimate Partner Violence: Not on file  ?  ? ?Family History  ?Problem Relation Age of Onset  ?? Cancer Paternal Grandmother   ?  ? ?Review of Systems ?Positive for chest pain ?N

## 2021-08-05 NOTE — ED Provider Notes (Signed)
? ?[facility] ?Provider Note ? ? ? Event Date/Time  ? First MD Initiated Contact with Patient 08/05/21 1143   ?  (approximate) ? ? ?History  ? ?Chest Pain ? ? ?HPI ? ?Edward Pitts is a 61 y.o. male with a history of coronary artery disease, heart block, hypertension, left bundle branch block, with cardiac pacemaker who presents after an episode of chest pain.  Patient reports he was riding on his lawnmower about an hour prior to arrival, had significant pressure to the chest and felt dizzy.  EMS arrived and gave him aspirin.  He reports he is feeling improved now.  Still feels slightly dizzy ?  ? ? ?Physical Exam  ? ?Triage Vital Signs: ?ED Triage Vitals  ?Enc Vitals Group  ?   BP 08/05/21 1135 (!) 136/56  ?   Pulse Rate 08/05/21 1135 65  ?   Resp 08/05/21 1135 20  ?   Temp 08/05/21 1135 98.4 ?F (36.9 ?C)  ?   Temp Source 08/05/21 1135 Oral  ?   SpO2 08/05/21 1135 97 %  ?   Weight 08/05/21 1136 113.4 kg (250 lb)  ?   Height --   ?   Head Circumference --   ?   Peak Flow --   ?   Pain Score 08/05/21 1136 5  ?   Pain Loc --   ?   Pain Edu? --   ?   Excl. in [place]? --   ? ? ?Most recent vital signs: ?Vitals:  ? 08/05/21 1200 08/05/21 1230  ?BP: (!) 110/38 (!) 135/59  ?Pulse: 63 (!) 59  ?Resp: (!) 27 (!) 22  ?Temp:    ?SpO2: 96% 96%  ? ? ? ?General: Awake, no distress.  ?CV:  Good peripheral perfusion.  ?Resp:  Normal effort.  ?Abd:  No distention.  ?Other:   ? ? ?ED Results / Procedures / Treatments  ? ?Labs ?(all labs ordered are listed, but only abnormal results are displayed) ?Labs Reviewed  ?CBC - Abnormal; Notable for the following components:  ?    Result Value  ? RBC 3.69 (*)   ? Hemoglobin 11.0 (*)   ? HCT 33.1 (*)   ? All other components within normal limits  ?COMPREHENSIVE METABOLIC PANEL - Abnormal; Notable for the following components:  ? Chloride 112 (*)   ? Glucose, Bld 118 (*)   ? BUN 31 (*)   ? Creatinine, Ser 1.57 (*)   ? GFR, Estimated 50 (*)   ? All other components within normal  limits  ?TROPONIN I (HIGH SENSITIVITY) - Abnormal; Notable for the following components:  ? Troponin I (High Sensitivity) 23 (*)   ? All other components within normal limits  ?TROPONIN I (HIGH SENSITIVITY)  ? ? ? ?EKG ? ?ED ECG REPORT ?I, Lavonia Drafts, the attending physician, personally viewed and interpreted this ECG. ? ?Date: 08/05/2021 ? ?Rhythm: Paced rhythm ?QRS Axis: Abnormal ?Intervals: Abnormal ?ST/T Wave abnormalities: Nonspecific changes ?Narrative Interpretation: no evidence of acute ischemia ? ? ? ?RADIOLOGY ?Chest x-ray interpreted by me, no acute abnormality ? ? ? ?PROCEDURES: ? ?Critical Care performed: yes ? ?CRITICAL CARE ?Performed by: Lavonia Drafts ? ? ?Total critical care time: 30 minutes ? ?Critical care time was exclusive of separately billable procedures and treating other patients. ? ?Critical care was necessary to treat or prevent imminent or life-threatening deterioration. ? ?Critical care was time spent personally by me on the following activities: development of treatment plan  with patient and/or surrogate as well as nursing, discussions with consultants, evaluation of patient's response to treatment, examination of patient, obtaining history from patient or surrogate, ordering and performing treatments and interventions, ordering and review of laboratory studies, ordering and review of radiographic studies, pulse oximetry and re-evaluation of patient's condition. ? ? ?Procedures ? ? ?MEDICATIONS ORDERED IN ED: ?Medications - No data to display ? ? ?IMPRESSION / MDM / ASSESSMENT AND PLAN / ED COURSE  ?I reviewed the triage vital signs and the nursing notes. ? ?Patient presents with chest pain as detailed above consistent with an acute illness or injury that poses a threat to life or bodily function ? ?Differential includes STEMI/NSTEMI/ACS/angina, vagal episode, dehydration ? ?EKG demonstrates paced rhythm, no clear evidence of ischemic changes, pending high sensitive  troponin. ? ?Chest x-ray is reassuring ? ?Patient's high-sensitivity troponin is 23, which is elevated. ? ?Given his significant risk factors, concerning HPI and elevated high-sensitivity troponin will admit to the hospitalist service ? ?Discussed with Dr. Francine Graven of the hospitalist service ? ? ? ? ? ?  ? ? ?FINAL CLINICAL IMPRESSION(S) / ED DIAGNOSES  ? ?Final diagnoses:  ?Chest pain, unspecified type  ?Elevated troponin  ? ? ? ?Rx / DC Orders  ? ?ED Discharge Orders   ? ? None  ? ?  ? ? ? ?Note:  This document was prepared using Dragon voice recognition software and may include unintentional dictation errors. ?  ?Lavonia Drafts, MD ?08/05/21 1330 ? ?

## 2021-08-05 NOTE — Assessment & Plan Note (Addendum)
Status post surgical resection with adjuvant radiation therapy ?Patient has also completed immunotherapy ?Follow-up with oncology as an outpatient. ?

## 2021-08-05 NOTE — H&P (Signed)
?History and Physical  ? ? ?Patient: Edward Pitts GGY:[id number] DOB: 1961-01-22 ?DOA: 08/05/2021 ?DOS: the patient was seen and examined on 08/05/2021 ?PCP: Mechele Claude, FNP  ?Patient coming from: Home ? ?Chief Complaint:  ?Chief Complaint  ?Patient presents with  ? Chest Pain  ? ?HPI: Edward Pitts is a 61 y.o. 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 ?Patient states that he was mowing his yard when he suddenly developed chest discomfort which he describes as an elephant sitting on his chest associated with shortness of breath and nausea but no emesis.  He denies having any palpitations or diaphoresis.  He was able to get off the lawnmower and took 4 baby aspirin.  He called his sister and then called EMS.  He notes that he had radiation of the pain to his left arm and states that his left arm felt numb and tingly.  EMS administered nitroglycerin with improvement in his pain. ?He denies having any fever, no chills, no cough, no abdominal pain, no changes in his bowel habits, no headache, he has chronic lower extremity swelling which is unchanged, no blurred vision or any focal deficits. ?Twelve-lead EKG showed a paced rhythm. ? ?Review of Systems: As mentioned in the history of present illness. All other systems reviewed and are negative. ?Past Medical History:  ?Diagnosis Date  ? Anemia   ? iron treatments  ? Anxiety   ? Aortic atherosclerosis ([place])   ? Arthritis   ? 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 left groin, resected, radiation  ? Cataract   ? Complication of anesthesia   ? PONV  ? Coronary artery disease   ? Dizziness of unknown  etiology   ? has led to seizures and passing out.  ? Family history of adverse reaction to anesthesia   ? PONV mother  ? GERD (gastroesophageal reflux disease)   ? History of complete heart block   ? PPM placed  ? Hyperlipidemia   ? Hypertension   ? LBBB (left bundle branch block)   ? Lymphedema of left leg   ? uses thigh high compression stockings  ? Melanoma (Edward Pitts) 2012  ? skin cancer, left thigh  ? OSA on CPAP   ? PONV (postoperative nausea and vomiting) 04/16/2019  ? Port-A-Cath in place   ? RIGHT chest wall  ? Presence of cardiac pacemaker   ? Medtronic  ? Seizures ([place])   ? still has episodes of dizziness. last event 1 month ago (march 2022) and will pass out. takes clonazepam  ? ?Past Surgical History:  ?Procedure Laterality Date  ? CT RADIATION THERAPY GUIDE    ? left groin  ? dental implant    ? permanent implant  ? KNEE SURGERY Left   ? arthroscopy  ? LEFT HEART CATH AND CORONARY ANGIOGRAPHY Left 06/29/2017  ? Procedure: LEFT HEART CATH AND CORONARY ANGIOGRAPHY;  Surgeon: [name], MD;  Location: [facility];  Service: Cardiovascular;  Laterality: Left;  ? LYMPH NODE DISSECTION Left 04/16/2019  ? Procedure: Left inguinal Lymph Node Dissection;  Surgeon: [name], MD;  Location: [facility];  Service: General;  Laterality: Left;  ? MELANOMA EXCISION Left 04/16/2019  ?  Procedure: MELANOMA EXCISION LEFT GROIN MASS;  Surgeon: [name], MD;  Location: [facility];  Service: General;  Laterality: Left;  ? MELANOMA EXCISION WITH SENTINEL LYMPH NODE BIOPSY Left 2012  ? Left calf   ? PACEMAKER INSERTION N/A 08/26/2018  ? Procedure: INSERTION PACEMAKER;  Surgeon: [name], MD;  Location: [facility];  Service: Cardiovascular;  Laterality: N/A;  ? PORTA CATH INSERTION N/A 08/26/2019  ? Procedure: PORTA CATH INSERTION;  Surgeon: Katha Cabal, MD;  Location: [facility];  Service: Cardiovascular;  Laterality: N/A;  ? SUPERFICIAL LYMPH NODE BIOPSY / EXCISION Left 2020  ? lymph nodes  removed around left groin melanoma site  ? TEMPORARY PACEMAKER N/A 08/25/2018  ? Procedure: TEMPORARY PACEMAKER;  Surgeon: [name], MD;  Location: [place] [facility];  Service: Cardiovascular;  Laterality: N/A;  ? TOTAL HIP ARTHROPLASTY Left 07/14/2020  ? Procedure: TOTAL HIP ARTHROPLASTY;  Surgeon: Dereck Leep, MD;  Location: [facility];  Service: Orthopedics;  Laterality: Left;  ? ?Social History:  reports that he has never smoked. He has never used smokeless tobacco. He reports that he does not drink alcohol and does not use drugs. ? ?Allergies  ?Allergen Reactions  ? Ibuprofen Other (See Comments)  ?  Affects kidneys  ? Nsaids   ?  Affects kidneys  ? ? ?Family History  ?Problem Relation Age of Onset  ? Cancer Paternal Grandmother   ? ? ?Prior to Admission medications   ?Medication Sig Start Date End Date Taking? Authorizing Provider  ?allopurinol (ZYLOPRIM) 300 MG tablet Take by mouth. 05/24/21   [provider]  ?ASPIRIN 81 PO Take 1 tablet by mouth daily.    [provider]  ?atorvastatin (LIPITOR) 20 MG tablet Take 20 mg by mouth daily. 07/15/18   [provider]  ?clonazePAM (KLONOPIN) 0.5 MG tablet 1 TABLET IN THE MORNING, 2 IN THE EVENING 07/21/21   Suzzanne Cloud, NP  ?hydrocortisone 2.5 % ointment Apply topically 2 (two) times daily.    [provider]  ?ketoconazole (NIZORAL) 2 % cream Apply 1 application topically daily.    [provider]  ?Lacosamide (VIMPAT) 100 MG TABS Take 1 tablet (100 mg total) by mouth in the morning and at bedtime. 07/26/21   Ventura Sellers, MD  ?lidocaine-prilocaine (EMLA) cream Apply 1 application. topically as needed. Apply small amount of cream to port site approx 1-2 hours prior to appointment. 07/21/21   [name], MD  ?lisinopril (ZESTRIL) 20 MG tablet Take 20 mg by mouth daily.    [provider]  ?LORazepam (ATIVAN) 2 MG tablet Take 1 tablet (2 mg total) by mouth every 8 (eight) hours as needed for seizure.  07/26/21   Ventura Sellers, MD  ?metoprolol succinate (TOPROL-XL) 25 MG 24 hr tablet Take 25 mg by mouth daily.  12/15/18   [provider]  ?Multiple Vitamin (MULTIVITAMIN WITH MINERALS) TABS tablet Take 1 tablet by mouth daily. Centrum Silver    [provider]  ?nystatin (MYCOSTATIN/NYSTOP) powder Apply 1 application topically 3 (three) times daily. Apply small amount to affected area 3 times a day until healed 11/30/20   [name], MD  ?omeprazole (PRILOSEC) 20 MG capsule TAKE 1 CAPSULE BY MOUTH EVERY DAY 07/12/21   [name], MD  ?ondansetron (ZOFRAN) 4 MG tablet TAKE 1 TABLET BY MOUTH EVERY 8 HOURS AS NEEDED FOR NAUSEA AND VOMITING 04/11/21   [name], MD  ? ? ?Physical Exam: ?Vitals:  ? 08/05/21  1200 08/05/21 1230 08/05/21 1300 08/05/21 1330  ?BP: (!) 110/38 (!) 135/59 (!) 128/50 (!) 144/63  ?Pulse: 63 (!) 59 (!) 59 (!) 59  ?Resp: (!) 27 (!) 22 (!) 22 19  ?Temp:      ?TempSrc:      ?SpO2: 96% 96% 95% 95%  ?Weight:      ? ?Physical Exam ?Vitals and nursing note reviewed.  ?Constitutional:   ?   Appearance: He is obese.  ?HENT:  ?   Head: Normocephalic and atraumatic.  ?Eyes:  ?   Pupils: Pupils are equal, round, and reactive to light.  ?Cardiovascular:  ?   Rate and Rhythm: Normal rate and regular rhythm.  ?   Heart sounds: Normal heart sounds.  ?Pulmonary:  ?   Effort: Pulmonary effort is normal.  ?   Breath sounds: Normal breath sounds.  ?Abdominal:  ?   General: Bowel sounds are normal.  ?   Palpations: Abdomen is soft.  ?Musculoskeletal:     ?   General: Normal range of motion.  ?   Cervical back: Normal range of motion and neck supple.  ?   Left lower leg: Edema present.  ?Skin: ?   General: Skin is warm and dry.  ?Neurological:  ?   General: No focal deficit present.  ?   Mental Status: He is alert.  ?Psychiatric:     ?   Mood and Affect: Mood normal.     ?   Behavior: Behavior normal.  ? ? ?Data Reviewed: ?Relevant notes from primary care and specialist visits, past discharge summaries as  available in EHR, including Care Everywhere. ?Prior diagnostic testing as pertinent to current admission diagnoses ?Updated medications and problem lists for reconciliation ?ED course, including vitals, labs, imagin

## 2021-08-05 NOTE — Assessment & Plan Note (Addendum)
Complicates overall prognosis and care ?Lifestyle modification and exercise has been discussed  ?

## 2021-08-05 NOTE — ED Triage Notes (Signed)
Pt arrives from home after mowing the yard with sudden CP and dizziness L arm tingling, pt took 4 baby asa. Pacer placed 3 yrs ago. I spray nitro given by EMS. 20G placed in r AC ?

## 2021-08-05 NOTE — Assessment & Plan Note (Addendum)
Status post pacemaker placement ? ?

## 2021-08-05 NOTE — Consult Note (Signed)
ANTICOAGULATION CONSULT NOTE - Initial Consult ? ?Pharmacy Consult for Heparin infusion ?Indication: chest pain/ACS ? ?Allergies  ?Allergen Reactions  ? Ibuprofen Other (See Comments)  ?  Affects kidneys  ? Nsaids   ?  Affects kidneys  ? ? ?Patient Measurements: ?Weight: 113.4 kg (250 lb) ?IBW 66.1kg ?Heparin Dosing Weight: 91.9kg ? ?Vital Signs: ?Temp: 98.4 ?F (36.9 ?C) (05/12 1135) ?Temp Source: Oral (05/12 1135) ?BP: 144/63 (05/12 1330) ?Pulse Rate: 59 (05/12 1330) ? ?Labs: ?Recent Labs  ?  08/05/21 ?1132 08/05/21 ?8242  ?HGB 11.0*  --   ?HCT 33.1*  --   ?PLT 208  --   ?CREATININE 1.57*  --   ?TROPONINIHS 23* 182*  ? ?Estimated Creatinine Clearance: 59.4 mL/min (A) (by C-G formula based on SCr of 1.57 mg/dL (H)). ? ?Medical History: ?Past Medical History:  ?Diagnosis Date  ? Anemia   ? iron treatments  ? Anxiety   ? Aortic atherosclerosis ([place])   ? Arthritis   ? 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 Cataract   ? Complication of anesthesia   ? PONV  ? Coronary artery disease   ? Dizziness of unknown etiology   ? has led to seizures and passing out.  ? Family history of adverse reaction to anesthesia   ? PONV mother  ? GERD (gastroesophageal reflux disease)   ? History of complete heart block   ? PPM placed  ? Hyperlipidemia   ? Hypertension   ? LBBB (left bundle branch block)   ? Lymphedema of left leg   ? uses thigh high compression stockings  ? Melanoma ([place]) 2012  ? skin cancer, left thigh  ? OSA on CPAP   ? PONV (postoperative nausea and vomiting) 04/16/2019  ? Port-A-Cath in place   ? RIGHT chest wall  ? Presence of cardiac pacemaker   ? Medtronic  ? Seizures ([place])   ? still has episodes of dizziness. last event 1 month ago (march 2022) and will pass out. takes clonazepam  ? ? ?Medications:  ?Scheduled:  ? allopurinol  300 mg Oral Daily  ? [START ON 08/06/2021] aspirin EC  81 mg Oral Daily  ? atorvastatin  20 mg Oral Daily  ? clonazePAM  0.5 mg Oral BID  ? Lacosamide  100 mg Oral BID   ? lisinopril  20 mg Oral Daily  ? metoprolol succinate  25 mg Oral Daily  ? multivitamin with minerals  1 tablet Oral Daily  ? pantoprazole  40 mg Oral Daily  ? sodium chloride flush  3 mL Intravenous Q12H  ? ? ?Assessment: ?PMH includes CAD, HTN, LBBB with pacemaker placement. Patient admitted to ED with chest pain. Pharmacy consulted to manage heparin infusion for ACS/STEMI. ? ?Goal of Therapy:  ?Heparin level 0.3-0.7 units/ml ?aPTT 66-102 seconds ?Monitor platelets by anticoagulation protocol: Yes ?  ?Plan:  ?Give 4000 units bolus x 1 ?Start heparin infusion at 1300 units/hr ?Check anti-Xa level in 6 hours and daily while on heparin ?Continue to monitor H&H and platelets ? ?Logen Heintzelman Rodriguez-Guzman PharmD, BCPS ?08/05/2021 2:39 PM ? ? ? ?

## 2021-08-05 NOTE — Assessment & Plan Note (Addendum)
Continue heparin and aspirin, nitro, statin, ACE inhibitor and metoprolol.  Possible cath on Monday ?

## 2021-08-05 NOTE — Assessment & Plan Note (Addendum)
Continue Vimpat ?- seizure precautions ?

## 2021-08-05 NOTE — ED Notes (Signed)
Regular bed requested for pt. ?

## 2021-08-05 NOTE — Consult Note (Addendum)
ANTICOAGULATION CONSULT NOTE ? ?Pharmacy Consult for Heparin infusion ?Indication: chest pain/ACS ? ?Allergies  ?Allergen Reactions  ? Ibuprofen Other (See Comments)  ?  Affects kidneys  ? Levetiracetam   ?  Other reaction(s): Dizziness, Headache  ? Nsaids   ?  Affects kidneys  ? ? ?Patient Measurements: ?Weight: 113.4 kg (250 lb) ?IBW 66.1kg ?Heparin Dosing Weight: 91.9kg ? ?Vital Signs: ?Temp: 98.4 ?F (36.9 ?C) (05/12 1135) ?Temp Source: Oral (05/12 1135) ?BP: 137/64 (05/12 2100) ?Pulse Rate: 60 (05/12 2100) ? ?Labs: ?Recent Labs  ?  08/05/21 ?1132 08/05/21 ?1322 08/05/21 ?2144  ?HGB 11.0*  --   --   ?HCT 33.1*  --   --   ?PLT 208  --   --   ?HEPARINUNFRC  --   --  0.25*  ?CREATININE 1.57*  --   --   ?TROPONINIHS 23* 182*  --   ? ? ?Estimated Creatinine Clearance: 59.4 mL/min (A) (by C-G formula based on SCr of 1.57 mg/dL (H)). ? ?Medical History: ?Past Medical History:  ?Diagnosis Date  ? Anemia   ? iron treatments  ? Anxiety   ? Aortic atherosclerosis ([place])   ? Arthritis   ? 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 left groin, resected, radiation  ? Cataract   ? Complication of anesthesia   ? PONV  ? Coronary artery disease   ? Dizziness of unknown etiology   ? has led to seizures and passing out.  ? Family history of adverse reaction to anesthesia   ? PONV mother  ? GERD (gastroesophageal reflux disease)   ? History of complete heart block   ? PPM placed  ? Hyperlipidemia   ? Hypertension   ? LBBB (left bundle branch block)   ? Lymphedema of left leg   ? uses thigh high compression stockings  ? Melanoma ([place]) 2012  ? skin cancer, left thigh  ? OSA on CPAP   ? PONV (postoperative nausea and vomiting) 04/16/2019  ? Port-A-Cath in place   ? RIGHT chest wall  ? Presence of cardiac pacemaker   ? Medtronic  ? Seizures ([place])   ? still has episodes of dizziness. last event 1 month ago (march 2022) and will pass out. takes clonazepam  ? ? ?Medications:  ?Scheduled:  ? [START ON 08/06/2021] allopurinol  300 mg Oral Daily  ? [START  ON 08/06/2021] aspirin EC  81 mg Oral Daily  ? [START ON 08/06/2021] atorvastatin  20 mg Oral Daily  ? clonazePAM  0.5 mg Oral BID  ? lacosamide  100 mg Oral BID  ? [START ON 08/06/2021] lisinopril  20 mg Oral Daily  ? [START ON 08/06/2021] metoprolol succinate  25 mg Oral Daily  ? [START ON 08/06/2021] multivitamin with minerals  1 tablet Oral Daily  ? [START ON 08/06/2021] pantoprazole  40 mg Oral Daily  ? sodium chloride flush  3 mL Intravenous Q12H  ? ? ?Assessment: ?PMH includes CAD, HTN, LBBB with pacemaker placement. Patient admitted to ED with chest pain. Pharmacy consulted to manage heparin infusion for ACS/STEMI. ? ?5/12 2144 HL 0.25  ? ?Goal of Therapy:  ?Heparin level 0.3-0.7 units/ml ?aPTT 66-102 seconds ?Monitor platelets by anticoagulation protocol: Yes ?  ?Plan:  ?Heparin level is subtherapeutic. Will give a heparin bolus on 1400 units and increase the heparin infusion to 1450 units/hr. Recheck heparin level in 6 hours. CBC daily while on heparin. Heparin level does not seem to be falsely elevated. Will use that for monitoring.  ? ?  Eleonore Chiquito, PharmD, BCPS ?08/05/2021 10:21 PM ? ? ? ?

## 2021-08-06 ENCOUNTER — Encounter: Payer: Self-pay | Admitting: Internal Medicine

## 2021-08-06 ENCOUNTER — Other Ambulatory Visit: Payer: Self-pay | Admitting: Oncology

## 2021-08-06 ENCOUNTER — Inpatient Hospital Stay: Admit: 2021-08-06

## 2021-08-06 DIAGNOSIS — I214 Non-ST elevation (NSTEMI) myocardial infarction: Secondary | ICD-10-CM | POA: Diagnosis not present

## 2021-08-06 DIAGNOSIS — N1831 Chronic kidney disease, stage 3a: Secondary | ICD-10-CM

## 2021-08-06 DIAGNOSIS — I25118 Atherosclerotic heart disease of native coronary artery with other forms of angina pectoris: Secondary | ICD-10-CM

## 2021-08-06 DIAGNOSIS — I442 Atrioventricular block, complete: Secondary | ICD-10-CM

## 2021-08-06 LAB — BASIC METABOLIC PANEL
Anion gap: 4 — ABNORMAL LOW (ref 5–15)
BUN: 28 mg/dL — ABNORMAL HIGH (ref 8–23)
CO2: 24 mmol/L (ref 22–32)
Calcium: 9.5 mg/dL (ref 8.9–10.3)
Chloride: 114 mmol/L — ABNORMAL HIGH (ref 98–111)
Creatinine, Ser: 1.37 mg/dL — ABNORMAL HIGH (ref 0.61–1.24)
GFR, Estimated: 59 mL/min — ABNORMAL LOW (ref >60)
Glucose, Bld: 116 mg/dL — ABNORMAL HIGH (ref 70–99)
Potassium: 4.2 mmol/L (ref 3.5–5.1)
Sodium: 142 mmol/L (ref 135–145)

## 2021-08-06 LAB — CBC
HCT: 34.4 % — ABNORMAL LOW (ref 39.0–52.0)
Hemoglobin: 11.3 g/dL — ABNORMAL LOW (ref 13.0–17.0)
MCH: 29.7 pg (ref 26.0–34.0)
MCHC: 32.8 g/dL (ref 30.0–36.0)
MCV: 90.3 fL (ref 80.0–100.0)
Platelets: 198 10*3/uL (ref 150–400)
RBC: 3.81 MIL/uL — ABNORMAL LOW (ref 4.22–5.81)
RDW: 13.2 % (ref 11.5–15.5)
WBC: 5.9 10*3/uL (ref 4.0–10.5)
nRBC: 0 % (ref 0.0–0.2)

## 2021-08-06 LAB — TROPONIN I (HIGH SENSITIVITY)
Troponin I (High Sensitivity): 272 ng/L (ref <18)
Troponin I (High Sensitivity): 200 ng/L (ref <18)
Troponin I (High Sensitivity): 184 ng/L (ref <18)

## 2021-08-06 LAB — PROTIME-INR
INR: 1.1 (ref 0.8–1.2)
Prothrombin Time: 13.8 seconds (ref 11.4–15.2)

## 2021-08-06 LAB — LIPID PANEL
Cholesterol: 156 mg/dL (ref 0–200)
HDL: 34 mg/dL — ABNORMAL LOW (ref >40)
LDL Cholesterol: 91 mg/dL (ref 0–99)
Total CHOL/HDL Ratio: 4.6 RATIO
Triglycerides: 153 mg/dL — ABNORMAL HIGH (ref <150)
VLDL: 31 mg/dL (ref 0–40)

## 2021-08-06 LAB — APTT: aPTT: 51 seconds — ABNORMAL HIGH (ref 24–36)

## 2021-08-06 LAB — HEPARIN LEVEL (UNFRACTIONATED)
Heparin Unfractionated: 0.26 IU/mL — ABNORMAL LOW (ref 0.30–0.70)
Heparin Unfractionated: 0.37 IU/mL (ref 0.30–0.70)
Heparin Unfractionated: 0.31 IU/mL (ref 0.30–0.70)

## 2021-08-06 MED ORDER — HEPARIN BOLUS VIA INFUSION
2000.0000 [IU] | Freq: Once | INTRAVENOUS | Status: AC
  Administered 2021-08-06: 2000 [IU] via INTRAVENOUS
  Filled 2021-08-06: qty 2000

## 2021-08-06 NOTE — ED Notes (Signed)
Patient is resting comfortably. 

## 2021-08-06 NOTE — ED Notes (Signed)
Pt resting comfortably, on bipap, call bell at the bedside, Heparin infusing, advise pt to call for help. ?

## 2021-08-06 NOTE — ED Notes (Signed)
Pt provided lunch tray.

## 2021-08-06 NOTE — Consult Note (Signed)
ANTICOAGULATION CONSULT NOTE ? ?Pharmacy Consult for Heparin infusion ?Indication: chest pain/ACS ? ?Allergies  ?Allergen Reactions  ? Ibuprofen Other (See Comments)  ?  Affects kidneys  ? Levetiracetam   ?  Other reaction(s): Dizziness, Headache  ? Nsaids   ?  Affects kidneys  ? ? ?Patient Measurements: ?Weight: 113.4 kg (250 lb) ?IBW 66.1kg ?Heparin Dosing Weight: 91.9kg ? ?Vital Signs: ?BP: 129/55 (05/13 0600) ?Pulse Rate: 59 (05/13 0600) ? ?Labs: ?Recent Labs  ?  08/05/21 ?1132 08/05/21 ?1322 08/05/21 ?2144 08/06/21 ?0500  ?HGB 11.0*  --   --  11.3*  ?HCT 33.1*  --   --  34.4*  ?PLT 208  --   --  198  ?APTT  --   --   --  51*  ?HEPARINUNFRC  --   --  0.25* 0.26*  ?CREATININE 1.57*  --   --   --   ?TROPONINIHS 23* 182*  --   --   ? ? ?Estimated Creatinine Clearance: 59.4 mL/min (A) (by C-G formula based on SCr of 1.57 mg/dL (H)). ? ?Medical History: ?Past Medical History:  ?Diagnosis Date  ? Anemia   ? iron treatments  ? Anxiety   ? Aortic atherosclerosis ([place])   ? Arthritis   ? 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 Cataract   ? Complication of anesthesia   ? PONV  ? Coronary artery disease   ? Dizziness of unknown etiology   ? has led to seizures and passing out.  ? Family history of adverse reaction to anesthesia   ? PONV mother  ? GERD (gastroesophageal reflux disease)   ? History of complete heart block   ? PPM placed  ? Hyperlipidemia   ? Hypertension   ? LBBB (left bundle branch block)   ? Lymphedema of left leg   ? uses thigh high compression stockings  ? Melanoma ([place]) 2012  ? skin cancer, left thigh  ? OSA on CPAP   ? PONV (postoperative nausea and vomiting) 04/16/2019  ? Port-A-Cath in place   ? RIGHT chest wall  ? Presence of cardiac pacemaker   ? Medtronic  ? Seizures ([place])   ? still has episodes of dizziness. last event 1 month ago (march 2022) and will pass out. takes clonazepam  ? ? ?Medications:  ?Scheduled:  ? allopurinol  300 mg Oral Daily  ? aspirin EC  81 mg Oral  Daily  ? atorvastatin  20 mg Oral Daily  ? clonazePAM  0.5 mg Oral BID  ? lacosamide  100 mg Oral BID  ? lisinopril  20 mg Oral Daily  ? metoprolol succinate  25 mg Oral Daily  ? multivitamin with minerals  1 tablet Oral Daily  ? pantoprazole  40 mg Oral Daily  ? sodium chloride flush  3 mL Intravenous Q12H  ? ? ?Assessment: ?PMH includes CAD, HTN, LBBB with pacemaker placement. Patient admitted to ED with chest pain. Pharmacy consulted to manage heparin infusion for ACS/STEMI. ? ?5/12 2144 HL 0.25  ?5/13 0500 HL 0.26 ? ?Goal of Therapy:  ?Heparin level 0.3-0.7 units/ml ?aPTT 66-102 seconds ?Monitor platelets by anticoagulation protocol: Yes ?  ?Plan:  ?Heparin level is subtherapeutic. Will give a heparin bolus on 2000 units and increase the heparin infusion to 1550 units/hr. Recheck heparin level in 6 hours. CBC daily while on heparin. Heparin level does not seem to be falsely elevated. Will use that for monitoring.  ? ?Eleonore Chiquito, PharmD, BCPS ?  08/06/2021 6:34 AM ? ? ? ?

## 2021-08-06 NOTE — Progress Notes (Signed)
[facility] Encounter Note ? ?Patient: Edward Pitts / Admit Date: 08/05/2021 / Date of Encounter: 08/06/2021, 2:29 PM ? ? ?Subjective: ?Patient overall feels well today with no further episodes of chest discomfort.  Troponin level has increased from 23/182/272.  He is in a paced rhythm from previous history of complete heart block.  Medication management has helped him for stability and echocardiogram is pending.  He remains on heparin and no need for nitrates at this time ? ?Review of Systems: ?Positive for: None ?Negative for: Vision change, hearing change, syncope, dizziness, nausea, vomiting,diarrhea, bloody stool, stomach pain, cough, congestion, diaphoresis, urinary frequency, urinary pain,skin lesions, skin rashes ?Others previously listed ? ?Objective: ?Telemetry: Paced rhythm ?Physical Exam: Blood pressure (!) 118/57, pulse 62, temperature 98.4 ?F (36.9 ?C), temperature source Oral, resp. rate 20, weight 113.4 kg, SpO2 96 %. Body mass index is 39.16 kg/m?. ?General: Well developed, well nourished, in no acute distress. ?Head: Normocephalic, atraumatic, sclera non-icteric, no xanthomas, nares are without discharge. ?Neck: No apparent masses ?Lungs: Normal respirations with no wheezes, no rhonchi, no rales , no crackles  ? Heart: Regular rate and rhythm, normal S1 S2, no murmur, no rub, no gallop, PMI is normal size and placement, carotid upstroke normal without bruit, jugular venous pressure normal ?Abdomen: Soft, non-tender, non-distended with normoactive bowel sounds. No hepatosplenomegaly. Abdominal aorta is normal size without bruit ?Extremities: No edema, no clubbing, no cyanosis, no ulcers,  ?Peripheral: 2+ radial, 2+ femoral, 2+ dorsal pedal pulses ?Neuro: Alert and oriented. Moves all extremities spontaneously. ?Psych:  Responds to questions appropriately with a normal affect. ? ?No intake or output data in the 24 hours ending 08/06/21 1429 ? ?Inpatient Medications:  ??  allopurinol  300 mg Oral Daily  ?? aspirin EC  81 mg Oral Daily  ?? atorvastatin  20 mg Oral Daily  ?? clonazePAM  0.5 mg Oral BID  ?? lacosamide  100 mg Oral BID  ?? lisinopril  20 mg Oral Daily  ?? metoprolol succinate  25 mg Oral Daily  ?? multivitamin with minerals  1 tablet Oral Daily  ?? pantoprazole  40 mg Oral Daily  ?? sodium chloride flush  3 mL Intravenous Q12H  ? ?Infusions:  ?? sodium chloride    ?? heparin 1,550 Units/hr (08/06/21 0741)  ? ? ?Labs: ?Recent Labs  ?  08/05/21 ?1132 08/06/21 ?0500  ?NA 141 142  ?K 4.1 4.2  ?CL 112* 114*  ?CO2 24 24  ?GLUCOSE 118* 116*  ?BUN 31* 28*  ?CREATININE 1.57* 1.37*  ?CALCIUM 9.0 9.5  ? ?Recent Labs  ?  08/05/21 ?1132  ?AST 35  ?ALT 37  ?ALKPHOS 93  ?BILITOT 0.9  ?PROT 7.3  ?ALBUMIN 4.1  ? ?Recent Labs  ?  08/05/21 ?1132 08/06/21 ?0500  ?WBC 5.1 5.9  ?HGB 11.0* 11.3*  ?HCT 33.1* 34.4*  ?MCV 89.7 90.3  ?PLT 208 198  ? ?No results for input(s): CKTOTAL, CKMB, TROPONINI in the last 72 hours. ?Invalid input(s): POCBNP ?No results for input(s): HGBA1C in the last 72 hours.  ? ?Weights: ?Filed Weights  ? 08/05/21 1136  ?Weight: 113.4 kg  ? ? ? ?Radiology/Studies:  ?DG Chest Port 1 View ? ?Result Date: 08/05/2021 ?CLINICAL DATA:  Chest pain and dizziness after mowing the yard. EXAM: PORTABLE CHEST 1 VIEW COMPARISON:  05/27/2021 FINDINGS: The right IJ power port is stable. The pacer wires are stable. The cardiac silhouette, mediastinal and hilar contours are within normal limits and unchanged. The lungs are clear.  No pleural effusions. No pulmonary lesions. The bony thorax is intact. IMPRESSION: No acute cardiopulmonary findings. Electronically Signed   By: Marijo Sanes M.D.   On: 08/05/2021 12:13   ? ? Assessment and Recommendation  ?61 y.o. male with acute substernal chest discomfort with elevated troponin and paced rhythm concerning for acute on non-ST elevation myocardial infarction.  Patient does have minimal atherosclerosis by cardiac catheterization previously. ?1.   Continue heparin and aspirin for further possibility of acute coronary syndrome ?2.  Echocardiogram for LV systolic dysfunction valvular heart disease ?3.  Further consideration of cardiac catheterization after discussion of the patient ? ?Signed, ?Serafina Royals M.D. [place] ? ?

## 2021-08-06 NOTE — Consult Note (Signed)
ANTICOAGULATION CONSULT NOTE ? ?Pharmacy Consult for Heparin infusion ?Indication: chest pain/ACS ? ?Allergies  ?Allergen Reactions  ? Ibuprofen Other (See Comments)  ?  Affects kidneys  ? Levetiracetam   ?  Other reaction(s): Dizziness, Headache  ? Nsaids   ?  Affects kidneys  ? ? ?Patient Measurements: ?Weight: 113.4 kg (250 lb) ?IBW 66.1kg ?Heparin Dosing Weight: 91.9kg ? ?Vital Signs: ?Temp: 98.4 ?F (36.9 ?C) (05/13 1607) ?Temp Source: Oral (05/13 1607) ?BP: 152/58 (05/13 1607) ?Pulse Rate: 64 (05/13 1607) ? ?Labs: ?Recent Labs  ?  08/05/21 ?1132 08/05/21 ?1322 08/05/21 ?2144 08/06/21 ?0500 08/06/21 ?0177 08/06/21 ?1510  ?HGB 11.0*  --   --  11.3*  --   --   ?HCT 33.1*  --   --  34.4*  --   --   ?PLT 208  --   --  198  --   --   ?APTT  --   --   --  51*  --   --   ?LABPROT  --   --   --   --  13.8  --   ?INR  --   --   --   --  1.1  --   ?HEPARINUNFRC  --   --  0.25* 0.26*  --  0.37  ?CREATININE 1.57*  --   --  1.37*  --   --   ?TROPONINIHS 23* 182*  --   --  272* 200*  ? ? ?Estimated Creatinine Clearance: 68.1 mL/min (A) (by C-G formula based on SCr of 1.37 mg/dL (H)). ? ?Medical History: ?Past Medical History:  ?Diagnosis Date  ? Anemia   ? iron treatments  ? Anxiety   ? Aortic atherosclerosis ([place])   ? Arthritis   ? 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 Cataract   ? Complication of anesthesia   ? PONV  ? Coronary artery disease   ? Dizziness of unknown etiology   ? has led to seizures and passing out.  ? Family history of adverse reaction to anesthesia   ? PONV mother  ? GERD (gastroesophageal reflux disease)   ? History of complete heart block   ? PPM placed  ? Hyperlipidemia   ? Hypertension   ? LBBB (left bundle branch block)   ? Lymphedema of left leg   ? uses thigh high compression stockings  ? Melanoma ([place]) 2012  ? skin cancer, left thigh  ? OSA on CPAP   ? PONV (postoperative nausea and vomiting) 04/16/2019  ? Port-A-Cath in place   ? RIGHT chest wall  ? Presence of cardiac  pacemaker   ? Medtronic  ? Seizures ([place])   ? still has episodes of dizziness. last event 1 month ago (march 2022) and will pass out. takes clonazepam  ? ? ?Medications:  ?Scheduled:  ? allopurinol  300 mg Oral Daily  ? aspirin EC  81 mg Oral Daily  ? atorvastatin  20 mg Oral Daily  ? clonazePAM  0.5 mg Oral BID  ? lacosamide  100 mg Oral BID  ? lisinopril  20 mg Oral Daily  ? metoprolol succinate  25 mg Oral Daily  ? multivitamin with minerals  1 tablet Oral Daily  ? pantoprazole  40 mg Oral Daily  ? sodium chloride flush  3 mL Intravenous Q12H  ? ? ?Assessment: ?PMH includes CAD, HTN, LBBB with pacemaker placement. Patient admitted to ED with chest pain. Pharmacy consulted to manage heparin  infusion for ACS/STEMI. ? ?5/12 2144 HL 0.25  ?5/13 0500 HL 0.26 ?5/13 1510 HL 0.37 ? ?Goal of Therapy:  ?Heparin level 0.3-0.7 units/ml ?Monitor platelets by anticoagulation protocol: Yes ?  ?Plan:  ?Heparin level is therapeutic. Continue heparin infusion at 1550 units/hr. Recheck heparin level in 6 hours to confirm. CBC daily while on heparin.  ? ? ?Sherilyn Banker, PharmD ?Clinical Pharmacist  ?08/06/2021 4:20 PM  ? ? ? ? ?

## 2021-08-06 NOTE — Progress Notes (Signed)
?  Progress Note ? ? ?Patient: Edward Pitts ZOX:[id number] DOB: Nov 16, 1960 DOA: 08/05/2021     1 ?DOS: the patient was seen and examined on 08/06/2021 ?  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 ?5/13: On heparin drip, possible cath on Monday, pending echo.  Patient remains symptomatic with shortness of breath but no chest pain ? ? ?Assessment and Plan: ?* NSTEMI (non-ST elevated myocardial infarction) ([place]) ?Patient with a history of two-vessel coronary artery disease was admitted for chest pain with radiation to his left arm associated with nausea. ?Twelve-lead EKG did not show any acute findings but patient has an uptrending troponin level ?Continue heparin drip ?Continue metoprolol, aspirin and atorvastatin ?Pending 2D echocardiogram to assess LVEF and rule out regional wall motion abnormality ?cardiology input appreciated.  May need cath on Monday ? ?Coronary artery disease involving native coronary artery of native heart ?Continue heparin and aspirin, nitro, statin, ACE inhibitor and metoprolol.  Possible cath on Monday ? ?Complete heart block ([place]) ?Status post pacemaker placement ? ? ?Stage 3a chronic kidney disease ([place]) ?At baseline ? ?Seizures ([place]) ?Continue Vimpat ?- seizure precautions ? 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 ? ?Obesity, Class III, BMI 40-49.9 (morbid obesity) ([place]) ?Complicates overall prognosis and care ?Lifestyle modification and exercise  has been discussed  ? ? ? ? ?  ? ?Subjective: Denies any further chest pain but he is feeling short of breath ? ?Physical Exam: ?Vitals:  ? 08/06/21 0930 08/06/21 1000 08/06/21 1200 08/06/21 1230  ?BP:  (!) 136/55 132/62   ?Pulse: 69 (!) 59 (!) 59 60  ?Resp: 19 18 (!) 23 16  ?Temp:      ?TempSrc:      ?SpO2: 96% 94% 94% 95%  ?Weight:      ? ?61 year old obese male not very comfortable.  Requesting a new mattress as the bed is not comfortable for him ?Lungs clear to auscultation ?Heart regular rate and rhythm ?Abdomen soft, benign ?Extremities 1+ pedal edema bilaterally ?Neuro alert and oriented, nonfocal ?Skin no rash or lesion ?Psych normal mood and affect ?Data Reviewed: ? ?Creatinine 1.37 ? ?Family Communication: None ? ?Disposition: ?Status is: Inpatient ?Remains inpatient appropriate because: Ongoing cardiology evaluation including possible cath on Monday ? ? Planned Discharge Destination: Home ? ? ? DVT prophylaxis-heparin drip ?Time spent: 35 minutes ? ?Author: ?Max Sane, MD ?08/06/2021 2:23 PM ? ?For on call review [email or web address]  ?

## 2021-08-06 NOTE — Hospital Course (Addendum)
61 year old male with history of recurrent malignant melanoma status postresection and adjuvant radiation, completed 2 years of immunotherapy, history of stage IIIa chronic kidney disease, morbid obesity (BMI 39), complete heart block status post pacemaker insertion, history of dizziness, seizure disorder, history of two-vessel coronary artery disease admitted for chest pain and worsening shortness of breath ? ?5/13: On heparin drip, possible cath on Monday, pending echo.  Patient remains symptomatic with shortness of breath but no chest pain ?5/14: Cardiac cath Tomorrow ?

## 2021-08-06 NOTE — Consult Note (Signed)
ANTICOAGULATION CONSULT NOTE ? ?Pharmacy Consult for Heparin infusion ?Indication: chest pain/ACS ? ?Allergies  ?Allergen Reactions  ? Ibuprofen Other (See Comments)  ?  Affects kidneys  ? Levetiracetam   ?  Other reaction(s): Dizziness, Headache  ? Nsaids   ?  Affects kidneys  ? ? ?Patient Measurements: ?Weight: 113.4 kg (250 lb) ?IBW 66.1kg ?Heparin Dosing Weight: 91.9kg ? ?Vital Signs: ?Temp: 98.6 ?F (37 ?C) (05/13 1911) ?Temp Source: Oral (05/13 1911) ?BP: 143/59 (05/13 2003) ?Pulse Rate: 61 (05/13 2003) ? ?Labs: ?Recent Labs  ?  08/05/21 ?1132 08/05/21 ?1322 08/06/21 ?0500 08/06/21 ?8144 08/06/21 ?1510 08/06/21 ?1734 08/06/21 ?2043  ?HGB 11.0*  --  11.3*  --   --   --   --   ?HCT 33.1*  --  34.4*  --   --   --   --   ?PLT 208  --  198  --   --   --   --   ?APTT  --   --  51*  --   --   --   --   ?LABPROT  --   --   --  13.8  --   --   --   ?INR  --   --   --  1.1  --   --   --   ?HEPARINUNFRC  --    < > 0.26*  --  0.37  --  0.31  ?CREATININE 1.57*  --  1.37*  --   --   --   --   ?TROPONINIHS 23*   < >  --  272* 200* 184*  --   ? < > = values in this interval not displayed.  ? ? ?Estimated Creatinine Clearance: 68.1 mL/min (A) (by C-G formula based on SCr of 1.37 mg/dL (H)). ? ?Medical History: ?Past Medical History:  ?Diagnosis Date  ? Anemia   ? iron treatments  ? Anxiety   ? Aortic atherosclerosis ([place])   ? Arthritis   ? 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 left groin, resected, radiation  ? Cataract   ? Complication of anesthesia   ? PONV  ? Coronary artery disease   ? Dizziness of unknown etiology   ? has led to seizures and passing out.  ? Family history of adverse reaction to anesthesia   ? PONV mother  ? GERD (gastroesophageal reflux disease)   ? History of complete heart block   ? PPM placed  ? Hyperlipidemia   ? Hypertension   ? LBBB (left bundle branch block)   ? Lymphedema of left leg   ? uses thigh high compression stockings  ? Melanoma ([place]) 2012  ? skin cancer, left thigh  ? OSA on CPAP   ? PONV  (postoperative nausea and vomiting) 04/16/2019  ? Port-A-Cath in place   ? RIGHT chest wall  ? Presence of cardiac pacemaker   ? Medtronic  ? Seizures ([place])   ? still has episodes of dizziness. last event 1 month ago (march 2022) and will pass out. takes clonazepam  ? ? ?Medications:  ?Scheduled:  ? allopurinol  300 mg Oral Daily  ? aspirin EC  81 mg Oral Daily  ? atorvastatin  20 mg Oral Daily  ? clonazePAM  0.5 mg Oral BID  ? lacosamide  100 mg Oral BID  ? lisinopril  20 mg Oral Daily  ? metoprolol succinate  25 mg Oral Daily  ? multivitamin with minerals  1 tablet Oral Daily  ?  pantoprazole  40 mg Oral Daily  ? sodium chloride flush  3 mL Intravenous Q12H  ? ? ?Assessment: ?PMH includes CAD, HTN, LBBB with pacemaker placement. Patient admitted to ED with chest pain. Pharmacy consulted to manage heparin infusion for ACS/STEMI. ? ?5/12 2144 HL 0.25  ?5/13 0500 HL 0.26 ?5/13 1510 HL 0.37 ?5/13 2043 HL 0.31 ? ?Goal of Therapy:  ?Heparin level 0.3-0.7 units/ml ?Monitor platelets by anticoagulation protocol: Yes ?  ?Plan:  ?Heparin level is therapeutic x 2. Continue heparin infusion at 1550 units/hr. Recheck heparin level with AM labs. CBC daily while on heparin.  ? ? ?Sherilyn Banker, PharmD ?Clinical Pharmacist  ?08/06/2021 9:38 PM  ? ? ? ? ?

## 2021-08-07 DIAGNOSIS — I214 Non-ST elevation (NSTEMI) myocardial infarction: Secondary | ICD-10-CM | POA: Diagnosis not present

## 2021-08-07 DIAGNOSIS — I442 Atrioventricular block, complete: Secondary | ICD-10-CM | POA: Diagnosis not present

## 2021-08-07 DIAGNOSIS — N1831 Chronic kidney disease, stage 3a: Secondary | ICD-10-CM | POA: Diagnosis not present

## 2021-08-07 DIAGNOSIS — I25118 Atherosclerotic heart disease of native coronary artery with other forms of angina pectoris: Secondary | ICD-10-CM | POA: Diagnosis not present

## 2021-08-07 LAB — CBC
HCT: 33.5 % — ABNORMAL LOW (ref 39.0–52.0)
Hemoglobin: 11.1 g/dL — ABNORMAL LOW (ref 13.0–17.0)
MCH: 29.5 pg (ref 26.0–34.0)
MCHC: 33.1 g/dL (ref 30.0–36.0)
MCV: 89.1 fL (ref 80.0–100.0)
Platelets: 193 10*3/uL (ref 150–400)
RBC: 3.76 MIL/uL — ABNORMAL LOW (ref 4.22–5.81)
RDW: 13.2 % (ref 11.5–15.5)
WBC: 5 10*3/uL (ref 4.0–10.5)
nRBC: 0 % (ref 0.0–0.2)

## 2021-08-07 LAB — HIV ANTIBODY (ROUTINE TESTING W REFLEX): HIV Screen 4th Generation wRfx: NONREACTIVE

## 2021-08-07 LAB — HEPARIN LEVEL (UNFRACTIONATED): Heparin Unfractionated: 0.31 IU/mL (ref 0.30–0.70)

## 2021-08-07 MED ORDER — ASPIRIN 81 MG PO CHEW
81.0000 mg | CHEWABLE_TABLET | ORAL | Status: AC
  Administered 2021-08-08: 81 mg via ORAL
  Filled 2021-08-07: qty 1

## 2021-08-07 MED ORDER — SODIUM CHLORIDE 0.9% FLUSH
3.0000 mL | Freq: Two times a day (BID) | INTRAVENOUS | Status: DC
  Administered 2021-08-08: 3 mL via INTRAVENOUS

## 2021-08-07 MED ORDER — SODIUM CHLORIDE 0.9% FLUSH: 3.0000 mL | INTRAVENOUS | Status: DC | PRN

## 2021-08-07 MED ORDER — SODIUM CHLORIDE 0.9 % WEIGHT BASED INFUSION
3.0000 mL/kg/h | INTRAVENOUS | Status: AC
  Administered 2021-08-08: 3 mL/kg/h via INTRAVENOUS

## 2021-08-07 MED ORDER — SODIUM CHLORIDE 0.9 % WEIGHT BASED INFUSION
1.0000 mL/kg/h | INTRAVENOUS | Status: DC
  Administered 2021-08-08: 1 mL/kg/h via INTRAVENOUS

## 2021-08-07 MED ORDER — SODIUM CHLORIDE 0.9 % IV SOLN: 250.0000 mL | INTRAVENOUS | Status: DC | PRN

## 2021-08-07 NOTE — Progress Notes (Signed)
?  Progress Note ? ? ?Patient: Edward Pitts WEX:[id number] DOB: [birth year] DOA: 08/05/2021     2 ?DOS: the patient was seen and examined on 08/07/2021 ?  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 ?5/13: On heparin drip, possible cath on Monday, pending echo.  Patient remains symptomatic with shortness of breath but no chest pain ?5/14: Cardiac cath Tomorrow ? ? ?Assessment and Plan: ?* NSTEMI (non-ST elevated myocardial infarction) ([place]) ?Patient with a history of two-vessel coronary artery disease was admitted for chest pain with radiation to his left arm associated with nausea. ?Twelve-lead EKG did not show any acute findings but patient has an uptrending troponin level ?Continue heparin drip ?Continue metoprolol, aspirin and atorvastatin ?Pending 2D echocardiogram to assess LVEF and rule out regional wall motion abnormality ?cardiology input appreciated.  Cardiac catheterization plan for tomorrow ? ?Coronary artery disease involving native coronary artery of native heart ?Continue heparin and aspirin, nitro, statin, ACE inhibitor and metoprolol.  Cath tomorrow ? ?Complete heart block ([place]) ?Status post pacemaker placement ? ? ?Stage 3a chronic kidney disease ([place]) ?At baseline.  Monitor closely as this could get worse dye load from cardiac catheterization tomorrow ? ?Seizures ([place]) ?Continue Vimpat ?- seizure precautions ? 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 ? ?Obesity, Class III, BMI 40-49.9 (morbid obesity) ([place]) ?Complicates overall prognosis and care ?Lifestyle modification and exercise has been discussed  ? ? ? ? ?  ? ?Subjective: Continues to be short of breath.  Not having much chest pain.  Hoping to have a cath tomorrow ? ?Physical Exam: ?Vitals:  ? 08/07/21 0015 08/07/21 0449 08/07/21 0737 08/07/21 1229  ?BP: 138/63 138/64 (!) 152/70 (!) 158/67  ?Pulse: 61 61 63 61  ?Resp: '20 19 17 18  '$ ?Temp: 98.5 ?F (36.9 ?C) 97.8 ?F (36.6 ?C) 98 ?F (36.7 ?C) 98.1 ?F (36.7 ?C)  ?TempSrc: Axillary Oral    ?SpO2: 97% 95% 96% 96%  ?Weight:      ? ?61 year old obese male not very comfortable.  Requesting a new mattress as the bed is not comfortable for him ?Lungs clear to auscultation ?Heart regular rate and rhythm ?Abdomen soft, benign ?Extremities 1+ pedal edema bilaterally ?Neuro alert and oriented, nonfocal ?Skin no rash or lesion ?Psych normal mood and affect ? ?Data Reviewed: ? ?Hemoglobin 11.1 ? ?Family Communication: None ? ?Disposition: ?Status is: Inpatient ?Remains inpatient appropriate because: Cardiac cath tomorrow ? ? Planned Discharge Destination: Home ? ? ? DVT prophylaxis-heparin drip ?Time spent: 35 minutes ? ?Author: ?Max Sane, MD ?08/07/2021 12:42 PM ? ?For on call review [email or web address]  ?

## 2021-08-07 NOTE — Assessment & Plan Note (Signed)
Status post surgical resection with adjuvant radiation therapy ?Patient has also completed immunotherapy ?Follow-up with oncology as an outpatient. ?

## 2021-08-07 NOTE — Assessment & Plan Note (Signed)
Status post pacemaker placement ? ?

## 2021-08-07 NOTE — Progress Notes (Signed)
?  Transition of Care (TOC) Screening Note ? ? ?Patient Details  ?Name: Ferd Horrigan ?Date of Birth: 09-30-1960 ? ? ?Transition of Care (TOC) CM/SW Contact:    ?[name], LCSW ?Phone Number: ?08/07/2021, 8:53 AM ? ? ? ?Transition of Care Department [facility]) has reviewed patient and no TOC needs have been identified at this time. We will continue to monitor patient advancement through interdisciplinary progression rounds. If new patient transition needs arise, please place a TOC consult. ? Pricilla Riffle, [place] ?[phone] ? ?

## 2021-08-07 NOTE — Assessment & Plan Note (Signed)
Continue heparin and aspirin, nitro, statin, ACE inhibitor and metoprolol.  Cath tomorrow ?

## 2021-08-07 NOTE — Assessment & Plan Note (Signed)
Patient with a history of two-vessel coronary artery disease was admitted for chest pain with radiation to his left arm associated with nausea. ?Twelve-lead EKG did not show any acute findings but patient has an uptrending troponin level ?Continue heparin drip ?Continue metoprolol, aspirin and atorvastatin ?Pending 2D echocardiogram to assess LVEF and rule out regional wall motion abnormality ?cardiology input appreciated.  Cardiac catheterization plan for tomorrow ?

## 2021-08-07 NOTE — Assessment & Plan Note (Signed)
At baseline.  Monitor closely as this could get worse dye load from cardiac catheterization tomorrow ?

## 2021-08-07 NOTE — Assessment & Plan Note (Signed)
Continue Vimpat ?- seizure precautions ?

## 2021-08-07 NOTE — Consult Note (Signed)
ANTICOAGULATION CONSULT NOTE ? ?Pharmacy Consult for Heparin infusion ?Indication: chest pain/ACS ? ?Allergies  ?Allergen Reactions  ? Ibuprofen Other (See Comments)  ?  Affects kidneys  ? Levetiracetam   ?  Other reaction(s): Dizziness, Headache  ? Nsaids   ?  Affects kidneys  ? ? ?Patient Measurements: ?Weight: 113.4 kg (250 lb) ?IBW 66.1kg ?Heparin Dosing Weight: 91.9kg ? ?Vital Signs: ?Temp: 97.8 ?F (36.6 ?C) (05/14 0449) ?Temp Source: Oral (05/14 0449) ?BP: 138/64 (05/14 0449) ?Pulse Rate: 61 (05/14 0449) ? ?Labs: ?Recent Labs  ?  08/05/21 ?1132 08/05/21 ?1322 08/06/21 ?0500 08/06/21 ?0973 08/06/21 ?1510 08/06/21 ?1734 08/06/21 ?2043 08/07/21 ?0429  ?HGB 11.0*  --  11.3*  --   --   --   --  11.1*  ?HCT 33.1*  --  34.4*  --   --   --   --  33.5*  ?PLT 208  --  198  --   --   --   --  193  ?APTT  --   --  51*  --   --   --   --   --   ?LABPROT  --   --   --  13.8  --   --   --   --   ?INR  --   --   --  1.1  --   --   --   --   ?HEPARINUNFRC  --    < > 0.26*  --  0.37  --  0.31 0.31  ?CREATININE 1.57*  --  1.37*  --   --   --   --   --   ?TROPONINIHS 23*   < >  --  272* 200* 184*  --   --   ? < > = values in this interval not displayed.  ? ? ?Estimated Creatinine Clearance: 68.1 mL/min (A) (by C-G formula based on SCr of 1.37 mg/dL (H)). ? ?Medical History: ?Past Medical History:  ?Diagnosis Date  ? Anemia   ? iron treatments  ? Anxiety   ? Aortic atherosclerosis ([place])   ? Arthritis   ? 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 left groin, resected, radiation  ? Cataract   ? Complication of anesthesia   ? PONV  ? Coronary artery disease   ? Dizziness of unknown etiology   ? has led to seizures and passing out.  ? Family history of adverse reaction to anesthesia   ? PONV mother  ? GERD (gastroesophageal reflux disease)   ? History of complete heart block   ? PPM placed  ? Hyperlipidemia   ? Hypertension   ? LBBB (left bundle branch block)   ? Lymphedema of left leg   ? uses thigh high compression stockings  ? Melanoma ([place])  2012  ? skin cancer, left thigh  ? OSA on CPAP   ? PONV (postoperative nausea and vomiting) 04/16/2019  ? Port-A-Cath in place   ? RIGHT chest wall  ? Presence of cardiac pacemaker   ? Medtronic  ? Seizures ([place])   ? still has episodes of dizziness. last event 1 month ago (march 2022) and will pass out. takes clonazepam  ? ? ?Medications:  ?Scheduled:  ? allopurinol  300 mg Oral Daily  ? aspirin EC  81 mg Oral Daily  ? atorvastatin  20 mg Oral Daily  ? clonazePAM  0.5 mg Oral BID  ? lacosamide  100 mg Oral BID  ? lisinopril  20 mg  Oral Daily  ? metoprolol succinate  25 mg Oral Daily  ? multivitamin with minerals  1 tablet Oral Daily  ? pantoprazole  40 mg Oral Daily  ? sodium chloride flush  3 mL Intravenous Q12H  ? ? ?Assessment: ?PMH includes CAD, HTN, LBBB with pacemaker placement. Patient admitted to ED with chest pain. Pharmacy consulted to manage heparin infusion for ACS/STEMI. ? ?5/12 2144 HL 0.25  ?5/13 0500 HL 0.26 ?5/13 1510 HL 0.37 ?5/13 2043 HL 0.31 ?5/14 0429 HL 0.31  ? ?Goal of Therapy:  ?Heparin level 0.3-0.7 units/ml ?Monitor platelets by anticoagulation protocol: Yes ?  ?Plan:  ?Heparin level is therapeutic. Continue heparin infusion at 1550 units/hr. Recheck heparin level and CBC with AM labs. ? ? ?Oswald Hillock, PharmD ?Clinical Pharmacist  ?08/07/2021 5:52 AM  ? ? ? ? ?

## 2021-08-07 NOTE — Assessment & Plan Note (Signed)
Complicates overall prognosis and care ?Lifestyle modification and exercise has been discussed  ?

## 2021-08-07 NOTE — Progress Notes (Signed)
[facility] Cardiology [facility] Encounter Note ? ?Patient: Edward Pitts / Admit Date: 08/05/2021 / Date of Encounter: 08/07/2021, 5:35 PM ? ? ?Subjective: ?Patient overall feels well today with no further episodes of chest discomfort.  Troponin level has increased from 23/182/272.  He is in a paced rhythm from previous history of complete heart block.  Medication management has helped him for stability and echocardiogram is pending.  He remains on heparin and no need for nitrates at this time ? ?Review of Systems: ?Positive for: None ?Negative for: Vision change, hearing change, syncope, dizziness, nausea, vomiting,diarrhea, bloody stool, stomach pain, cough, congestion, diaphoresis, urinary frequency, urinary pain,skin lesions, skin rashes ?Others previously listed ? ?Objective: ?Telemetry: Paced rhythm ?Physical Exam: Blood pressure (!) 131/57, pulse 60, temperature 98.6 ?F (37 ?C), resp. rate 18, weight 113.4 kg, SpO2 95 %. Body mass index is 39.16 kg/m?. ?General: Well developed, well nourished, in no acute distress. ?Head: Normocephalic, atraumatic, sclera non-icteric, no xanthomas, nares are without discharge. ?Neck: No apparent masses ?Lungs: Normal respirations with no wheezes, no rhonchi, no rales , no crackles  ? Heart: Regular rate and rhythm, normal S1 S2, no murmur, no rub, no gallop, PMI is normal size and placement, carotid upstroke normal without bruit, jugular venous pressure normal ?Abdomen: Soft, non-tender, non-distended with normoactive bowel sounds. No hepatosplenomegaly. Abdominal aorta is normal size without bruit ?Extremities: No edema, no clubbing, no cyanosis, no ulcers,  ?Peripheral: 2+ radial, 2+ femoral, 2+ dorsal pedal pulses ?Neuro: Alert and oriented. Moves all extremities spontaneously. ?Psych:  Responds to questions appropriately with a normal affect. ? ? ?Intake/Output Summary (Last 24 hours) at 08/07/2021 1735 ?Last data filed at 08/07/2021 1200 ?Gross per 24 hour  ?Intake 420.8  ml  ?Output 1600 ml  ?Net -1179.2 ml  ? ? ?Inpatient Medications:  ?? allopurinol  300 mg Oral Daily  ?? aspirin EC  81 mg Oral Daily  ?? atorvastatin  20 mg Oral Daily  ?? clonazePAM  0.5 mg Oral BID  ?? lacosamide  100 mg Oral BID  ?? lisinopril  20 mg Oral Daily  ?? metoprolol succinate  25 mg Oral Daily  ?? multivitamin with minerals  1 tablet Oral Daily  ?? pantoprazole  40 mg Oral Daily  ?? sodium chloride flush  3 mL Intravenous Q12H  ? ?Infusions:  ?? sodium chloride    ?? heparin 1,550 Units/hr (08/07/21 1421)  ? ? ?Labs: ?Recent Labs  ?  08/05/21 ?1132 08/06/21 ?0500  ?NA 141 142  ?K 4.1 4.2  ?CL 112* 114*  ?CO2 24 24  ?GLUCOSE 118* 116*  ?BUN 31* 28*  ?CREATININE 1.57* 1.37*  ?CALCIUM 9.0 9.5  ? ? ?Recent Labs  ?  08/05/21 ?1132  ?AST 35  ?ALT 37  ?ALKPHOS 93  ?BILITOT 0.9  ?PROT 7.3  ?ALBUMIN 4.1  ? ? ?Recent Labs  ?  08/06/21 ?0500 08/07/21 ?0429  ?WBC 5.9 5.0  ?HGB 11.3* 11.1*  ?HCT 34.4* 33.5*  ?MCV 90.3 89.1  ?PLT 198 193  ? ? ?No results for input(s): CKTOTAL, CKMB, TROPONINI in the last 72 hours. ?Invalid input(s): POCBNP ?No results for input(s): HGBA1C in the last 72 hours.  ? ?Weights: ?Filed Weights  ? 08/05/21 1136  ?Weight: 113.4 kg  ? ? ? ?Radiology/Studies:  ?DG Chest Port 1 View ? ?Result Date: 08/05/2021 ?CLINICAL DATA:  Chest pain and dizziness after mowing the yard. EXAM: PORTABLE CHEST 1 VIEW COMPARISON:  05/27/2021 FINDINGS: The right IJ power port is stable. The  pacer wires are stable. The cardiac silhouette, mediastinal and hilar contours are within normal limits and unchanged. The lungs are clear. No pleural effusions. No pulmonary lesions. The bony thorax is intact. IMPRESSION: No acute cardiopulmonary findings. Electronically Signed   By: Marijo Sanes M.D.   On: 08/05/2021 12:13   ? ? Assessment and Recommendation  ?61 y.o. male with acute substernal chest discomfort with elevated troponin and paced rhythm concerning for acute on non-ST elevation myocardial infarction.  Patient  does have minimal atherosclerosis by cardiac catheterization previously.  And after discussion has had some mild amount of chest discomfort throughout the weekend. ?1.  Continue heparin and aspirin for further possibility of acute coronary syndrome ?2.  Echocardiogram for LV systolic dysfunction valvular heart disease ?3.  Proceed to cardiac catheterization to assess coronary anatomy and further treatment thereof is necessary.  Patient understands risk and benefits of cardiac catheterization.  This includes a possibility of death stroke heart attack infection bleeding or blood clot.  He is at low risk for conscious sedation ?Signed, ?Serafina Royals M.D. [place] ? ?

## 2021-08-08 ENCOUNTER — Encounter: Payer: Self-pay | Admitting: Oncology

## 2021-08-08 ENCOUNTER — Inpatient Hospital Stay
Admit: 2021-08-08 | Discharge: 2021-08-08 | Disposition: A | Discharge status: Home / Self Care | Attending: Internal Medicine | Admitting: Internal Medicine

## 2021-08-08 ENCOUNTER — Other Ambulatory Visit: Payer: Self-pay

## 2021-08-08 ENCOUNTER — Encounter
Admission: EM | Disposition: A | Discharge status: Other Acute Inpatient Hospital | Payer: Self-pay | Source: Home / Self Care | Attending: Internal Medicine

## 2021-08-08 ENCOUNTER — Inpatient Hospital Stay (HOSPITAL_COMMUNITY)
Admission: AD | Admit: 2021-08-08 | Discharge: 2021-08-16 | DRG: 236 | Disposition: A | Discharge status: Home Health | Source: Other Acute Inpatient Hospital | Attending: Cardiothoracic Surgery | Admitting: Cardiothoracic Surgery

## 2021-08-08 DIAGNOSIS — I2510 Atherosclerotic heart disease of native coronary artery without angina pectoris: Secondary | ICD-10-CM | POA: Diagnosis not present

## 2021-08-08 DIAGNOSIS — R350 Frequency of micturition: Secondary | ICD-10-CM | POA: Diagnosis not present

## 2021-08-08 DIAGNOSIS — I25118 Atherosclerotic heart disease of native coronary artery with other forms of angina pectoris: Secondary | ICD-10-CM | POA: Diagnosis not present

## 2021-08-08 DIAGNOSIS — Z923 Personal history of irradiation: Secondary | ICD-10-CM

## 2021-08-08 DIAGNOSIS — Z7982 Long term (current) use of aspirin: Secondary | ICD-10-CM | POA: Diagnosis not present

## 2021-08-08 DIAGNOSIS — N179 Acute kidney failure, unspecified: Secondary | ICD-10-CM | POA: Diagnosis not present

## 2021-08-08 DIAGNOSIS — Z6839 Body mass index (BMI) 39.0-39.9, adult: Secondary | ICD-10-CM

## 2021-08-08 DIAGNOSIS — C774 Secondary and unspecified malignant neoplasm of inguinal and lower limb lymph nodes: Secondary | ICD-10-CM | POA: Diagnosis not present

## 2021-08-08 DIAGNOSIS — Z85820 Personal history of malignant melanoma of skin: Secondary | ICD-10-CM

## 2021-08-08 DIAGNOSIS — C779 Secondary and unspecified malignant neoplasm of lymph node, unspecified: Secondary | ICD-10-CM

## 2021-08-08 DIAGNOSIS — Z79899 Other long term (current) drug therapy: Secondary | ICD-10-CM | POA: Diagnosis not present

## 2021-08-08 DIAGNOSIS — Z95828 Presence of other vascular implants and grafts: Secondary | ICD-10-CM

## 2021-08-08 DIAGNOSIS — K219 Gastro-esophageal reflux disease without esophagitis: Secondary | ICD-10-CM | POA: Diagnosis present

## 2021-08-08 DIAGNOSIS — F419 Anxiety disorder, unspecified: Secondary | ICD-10-CM | POA: Diagnosis present

## 2021-08-08 DIAGNOSIS — D62 Acute posthemorrhagic anemia: Secondary | ICD-10-CM | POA: Diagnosis not present

## 2021-08-08 DIAGNOSIS — I129 Hypertensive chronic kidney disease with stage 1 through stage 4 chronic kidney disease, or unspecified chronic kidney disease: Secondary | ICD-10-CM | POA: Diagnosis present

## 2021-08-08 DIAGNOSIS — G40909 Epilepsy, unspecified, not intractable, without status epilepticus: Secondary | ICD-10-CM | POA: Diagnosis present

## 2021-08-08 DIAGNOSIS — I10 Essential (primary) hypertension: Secondary | ICD-10-CM | POA: Diagnosis present

## 2021-08-08 DIAGNOSIS — Z20822 Contact with and (suspected) exposure to covid-19: Secondary | ICD-10-CM | POA: Diagnosis present

## 2021-08-08 DIAGNOSIS — D689 Coagulation defect, unspecified: Secondary | ICD-10-CM | POA: Diagnosis present

## 2021-08-08 DIAGNOSIS — I4892 Unspecified atrial flutter: Secondary | ICD-10-CM | POA: Diagnosis not present

## 2021-08-08 DIAGNOSIS — G4733 Obstructive sleep apnea (adult) (pediatric): Secondary | ICD-10-CM | POA: Diagnosis present

## 2021-08-08 DIAGNOSIS — I700 Atherosclerosis of aorta: Secondary | ICD-10-CM | POA: Diagnosis present

## 2021-08-08 DIAGNOSIS — M1990 Unspecified osteoarthritis, unspecified site: Secondary | ICD-10-CM | POA: Diagnosis present

## 2021-08-08 DIAGNOSIS — Z809 Family history of malignant neoplasm, unspecified: Secondary | ICD-10-CM | POA: Diagnosis not present

## 2021-08-08 DIAGNOSIS — N1831 Chronic kidney disease, stage 3a: Secondary | ICD-10-CM | POA: Diagnosis present

## 2021-08-08 DIAGNOSIS — I890 Lymphedema, not elsewhere classified: Secondary | ICD-10-CM | POA: Diagnosis present

## 2021-08-08 DIAGNOSIS — D696 Thrombocytopenia, unspecified: Secondary | ICD-10-CM | POA: Diagnosis not present

## 2021-08-08 DIAGNOSIS — R7303 Prediabetes: Secondary | ICD-10-CM | POA: Diagnosis present

## 2021-08-08 DIAGNOSIS — E8770 Fluid overload, unspecified: Secondary | ICD-10-CM | POA: Diagnosis not present

## 2021-08-08 DIAGNOSIS — I442 Atrioventricular block, complete: Secondary | ICD-10-CM | POA: Diagnosis present

## 2021-08-08 DIAGNOSIS — R0789 Other chest pain: Secondary | ICD-10-CM | POA: Diagnosis present

## 2021-08-08 DIAGNOSIS — I25110 Atherosclerotic heart disease of native coronary artery with unstable angina pectoris: Secondary | ICD-10-CM | POA: Diagnosis present

## 2021-08-08 DIAGNOSIS — Z950 Presence of cardiac pacemaker: Secondary | ICD-10-CM

## 2021-08-08 DIAGNOSIS — Z951 Presence of aortocoronary bypass graft: Secondary | ICD-10-CM

## 2021-08-08 DIAGNOSIS — J9811 Atelectasis: Secondary | ICD-10-CM | POA: Diagnosis not present

## 2021-08-08 DIAGNOSIS — Z9221 Personal history of antineoplastic chemotherapy: Secondary | ICD-10-CM

## 2021-08-08 DIAGNOSIS — Z96642 Presence of left artificial hip joint: Secondary | ICD-10-CM | POA: Diagnosis not present

## 2021-08-08 DIAGNOSIS — D72829 Elevated white blood cell count, unspecified: Secondary | ICD-10-CM | POA: Diagnosis not present

## 2021-08-08 DIAGNOSIS — E669 Obesity, unspecified: Secondary | ICD-10-CM | POA: Diagnosis present

## 2021-08-08 DIAGNOSIS — C439 Malignant melanoma of skin, unspecified: Secondary | ICD-10-CM

## 2021-08-08 DIAGNOSIS — I214 Non-ST elevation (NSTEMI) myocardial infarction: Principal | ICD-10-CM

## 2021-08-08 DIAGNOSIS — R569 Unspecified convulsions: Secondary | ICD-10-CM

## 2021-08-08 DIAGNOSIS — Z888 Allergy status to other drugs, medicaments and biological substances status: Secondary | ICD-10-CM

## 2021-08-08 DIAGNOSIS — E785 Hyperlipidemia, unspecified: Secondary | ICD-10-CM | POA: Diagnosis present

## 2021-08-08 DIAGNOSIS — Z9989 Dependence on other enabling machines and devices: Secondary | ICD-10-CM | POA: Diagnosis not present

## 2021-08-08 DIAGNOSIS — Z01810 Encounter for preprocedural cardiovascular examination: Secondary | ICD-10-CM | POA: Diagnosis not present

## 2021-08-08 HISTORY — PX: LEFT HEART CATH AND CORONARY ANGIOGRAPHY: CATH118249

## 2021-08-08 LAB — BASIC METABOLIC PANEL
Anion gap: 9 (ref 5–15)
BUN: 27 mg/dL — ABNORMAL HIGH (ref 8–23)
CO2: 26 mmol/L (ref 22–32)
Calcium: 9.6 mg/dL (ref 8.9–10.3)
Chloride: 107 mmol/L (ref 98–111)
Creatinine, Ser: 1.5 mg/dL — ABNORMAL HIGH (ref 0.61–1.24)
GFR, Estimated: 53 mL/min — ABNORMAL LOW (ref >60)
Glucose, Bld: 108 mg/dL — ABNORMAL HIGH (ref 70–99)
Potassium: 4.1 mmol/L (ref 3.5–5.1)
Sodium: 142 mmol/L (ref 135–145)

## 2021-08-08 LAB — ECHOCARDIOGRAM COMPLETE
AR max vel: 2.19 cm2
AV Area VTI: 2.02 cm2
AV Area mean vel: 1.96 cm2
AV Mean grad: 5 mmHg
AV Peak grad: 8.4 mmHg
Ao pk vel: 1.45 m/s
Area-P 1/2: 3.31 cm2
MV VTI: 1.95 cm2
S' Lateral: 3.4 cm
Weight: 4000 oz

## 2021-08-08 LAB — CBC
HCT: 33.8 % — ABNORMAL LOW (ref 39.0–52.0)
Hemoglobin: 11.4 g/dL — ABNORMAL LOW (ref 13.0–17.0)
MCH: 29.8 pg (ref 26.0–34.0)
MCHC: 33.7 g/dL (ref 30.0–36.0)
MCV: 88.3 fL (ref 80.0–100.0)
Platelets: 195 10*3/uL (ref 150–400)
RBC: 3.83 MIL/uL — ABNORMAL LOW (ref 4.22–5.81)
RDW: 13.1 % (ref 11.5–15.5)
WBC: 5.7 10*3/uL (ref 4.0–10.5)
nRBC: 0 % (ref 0.0–0.2)

## 2021-08-08 LAB — LIPOPROTEIN A (LPA): Lipoprotein (a): 219.9 nmol/L — ABNORMAL HIGH (ref <75.0)

## 2021-08-08 LAB — HEPARIN LEVEL (UNFRACTIONATED): Heparin Unfractionated: 0.24 IU/mL — ABNORMAL LOW (ref 0.30–0.70)

## 2021-08-08 SURGERY — LEFT HEART CATH AND CORONARY ANGIOGRAPHY
Anesthesia: Moderate Sedation

## 2021-08-08 MED ORDER — CLONAZEPAM 0.5 MG PO TABS
0.5000 mg | ORAL_TABLET | Freq: Every day | ORAL | Status: DC
  Administered 2021-08-09: 0.5 mg via ORAL
  Filled 2021-08-08: qty 1

## 2021-08-08 MED ORDER — VERAPAMIL HCL 2.5 MG/ML IV SOLN
INTRAVENOUS | Status: DC | PRN
  Administered 2021-08-08: 2.5 mg via INTRAVENOUS

## 2021-08-08 MED ORDER — ONDANSETRON HCL 4 MG PO TABS: 4.0000 mg | ORAL_TABLET | Freq: Four times a day (QID) | ORAL | Status: DC | PRN

## 2021-08-08 MED ORDER — ACETAMINOPHEN 650 MG RE SUPP: 650.0000 mg | Freq: Four times a day (QID) | RECTAL | Status: DC | PRN

## 2021-08-08 MED ORDER — HEPARIN (PORCINE) IN NACL 1000-0.9 UT/500ML-% IV SOLN
INTRAVENOUS | Status: AC
  Filled 2021-08-08: qty 1000

## 2021-08-08 MED ORDER — LACOSAMIDE 50 MG PO TABS
100.0000 mg | ORAL_TABLET | Freq: Two times a day (BID) | ORAL | Status: DC
  Administered 2021-08-09 (×3): 100 mg via ORAL
  Filled 2021-08-08 (×3): qty 2

## 2021-08-08 MED ORDER — SODIUM CHLORIDE 0.9 % IV SOLN: INTRAVENOUS | Status: AC

## 2021-08-08 MED ORDER — ACETAMINOPHEN 325 MG PO TABS: 650.0000 mg | ORAL_TABLET | Freq: Four times a day (QID) | ORAL | Status: DC | PRN

## 2021-08-08 MED ORDER — HEPARIN (PORCINE) IN NACL 2000-0.9 UNIT/L-% IV SOLN
INTRAVENOUS | Status: DC | PRN
  Administered 2021-08-08: 1000 mL

## 2021-08-08 MED ORDER — HEPARIN SODIUM (PORCINE) 1000 UNIT/ML IJ SOLN
INTRAMUSCULAR | Status: AC
  Filled 2021-08-08: qty 10

## 2021-08-08 MED ORDER — VERAPAMIL HCL 2.5 MG/ML IV SOLN
INTRAVENOUS | Status: AC
  Filled 2021-08-08: qty 2

## 2021-08-08 MED ORDER — ACETAMINOPHEN 325 MG PO TABS: 650.0000 mg | ORAL_TABLET | ORAL | Status: DC | PRN

## 2021-08-08 MED ORDER — ONDANSETRON HCL 4 MG/2ML IJ SOLN: 4.0000 mg | Freq: Four times a day (QID) | INTRAMUSCULAR | Status: DC | PRN

## 2021-08-08 MED ORDER — HEPARIN BOLUS VIA INFUSION
1400.0000 [IU] | Freq: Once | INTRAVENOUS | Status: AC
  Administered 2021-08-08: 1400 [IU] via INTRAVENOUS
  Filled 2021-08-08: qty 1400

## 2021-08-08 MED ORDER — IOHEXOL 300 MG/ML  SOLN
INTRAMUSCULAR | Status: DC | PRN
Start: 2021-08-08 — End: 2021-08-08
  Administered 2021-08-08: 75 mL

## 2021-08-08 MED ORDER — HEPARIN SODIUM (PORCINE) 1000 UNIT/ML IJ SOLN
INTRAMUSCULAR | Status: DC | PRN
Start: 2021-08-08 — End: 2021-08-08
  Administered 2021-08-08: 5000 [IU] via INTRAVENOUS

## 2021-08-08 MED ORDER — LABETALOL HCL 5 MG/ML IV SOLN: 10.0000 mg | INTRAVENOUS | Status: AC | PRN

## 2021-08-08 MED ORDER — LIDOCAINE HCL 1 % IJ SOLN
INTRAMUSCULAR | Status: AC
  Filled 2021-08-08: qty 20

## 2021-08-08 MED ORDER — PANTOPRAZOLE SODIUM 40 MG PO TBEC
40.0000 mg | DELAYED_RELEASE_TABLET | Freq: Every day | ORAL | Status: DC
  Administered 2021-08-09: 40 mg via ORAL
  Filled 2021-08-08: qty 1

## 2021-08-08 MED ORDER — MIDAZOLAM HCL 2 MG/2ML IJ SOLN
INTRAMUSCULAR | Status: AC
  Filled 2021-08-08: qty 2

## 2021-08-08 MED ORDER — METOPROLOL SUCCINATE ER 25 MG PO TB24
25.0000 mg | ORAL_TABLET | Freq: Every day | ORAL | Status: DC
Start: 2021-08-09 — End: 2021-08-10
  Administered 2021-08-09: 25 mg via ORAL
  Filled 2021-08-08: qty 1

## 2021-08-08 MED ORDER — HYDROCODONE-ACETAMINOPHEN 5-325 MG PO TABS: 1.0000 | ORAL_TABLET | ORAL | Status: DC | PRN

## 2021-08-08 MED ORDER — ATORVASTATIN CALCIUM 10 MG PO TABS
20.0000 mg | ORAL_TABLET | Freq: Every day | ORAL | Status: DC
  Administered 2021-08-09 – 2021-08-16 (×7): 20 mg via ORAL
  Filled 2021-08-08 (×7): qty 2

## 2021-08-08 MED ORDER — SODIUM CHLORIDE 0.9 % WEIGHT BASED INFUSION
1.0000 mL/kg/h | INTRAVENOUS | Status: DC
  Administered 2021-08-08: 1 mL/kg/h via INTRAVENOUS

## 2021-08-08 MED ORDER — FENTANYL CITRATE (PF) 100 MCG/2ML IJ SOLN
INTRAMUSCULAR | Status: AC
  Filled 2021-08-08: qty 2

## 2021-08-08 MED ORDER — LIDOCAINE HCL (PF) 1 % IJ SOLN
INTRAMUSCULAR | Status: DC | PRN
  Administered 2021-08-08: 2 mL

## 2021-08-08 MED ORDER — HYDRALAZINE HCL 20 MG/ML IJ SOLN: 10.0000 mg | INTRAMUSCULAR | Status: AC | PRN

## 2021-08-08 MED ORDER — HEPARIN (PORCINE) 25000 UT/250ML-% IV SOLN: 1700.0000 [IU]/h | INTRAVENOUS | Status: DC

## 2021-08-08 MED ORDER — MIDAZOLAM HCL 2 MG/2ML IJ SOLN
INTRAMUSCULAR | Status: DC | PRN
  Administered 2021-08-08 (×2): 1 mg via INTRAVENOUS

## 2021-08-08 MED ORDER — CLONAZEPAM 0.5 MG PO TABS
1.0000 mg | ORAL_TABLET | Freq: Every day | ORAL | Status: DC
  Administered 2021-08-09 (×2): 1 mg via ORAL
  Filled 2021-08-08 (×2): qty 2

## 2021-08-08 MED ORDER — FENTANYL CITRATE (PF) 100 MCG/2ML IJ SOLN
INTRAMUSCULAR | Status: DC | PRN
Start: 2021-08-08 — End: 2021-08-08
  Administered 2021-08-08 (×2): 25 ug via INTRAVENOUS

## 2021-08-08 SURGICAL SUPPLY — 10 items
CATH 5FR JL3.5 JR4 ANG PIG MP (CATHETERS) ×1 IMPLANT
DEVICE RAD COMP TR BAND LRG (VASCULAR PRODUCTS) ×1 IMPLANT
DRAPE BRACHIAL (DRAPES) ×1 IMPLANT
GLIDESHEATH SLEND SS 6F .021 (SHEATH) ×1 IMPLANT
GUIDEWIRE INQWIRE 1.5J.035X260 (WIRE) IMPLANT
INQWIRE 1.5J .035X260CM (WIRE) ×2
PACK CARDIAC CATH (CUSTOM PROCEDURE TRAY) ×2 IMPLANT
PROTECTION STATION PRESSURIZED (MISCELLANEOUS) ×2
SET ATX SIMPLICITY (MISCELLANEOUS) ×1 IMPLANT
STATION PROTECTION PRESSURIZED (MISCELLANEOUS) IMPLANT

## 2021-08-08 NOTE — TOC Initial Note (Signed)
Transition of Care (TOC) - Initial/Assessment Note  ? ? ?Patient Details  ?Name: Edward Pitts ?MRN: [id number] ?Date of Birth: 08/14/1960 ? ?Transition of Care (TOC) CM/SW Contact:    ?Laurena Slimmer, RN ?Phone Number: ?08/08/2021, 10:09 AM ? ?Clinical Narrative:                 ? ?Transition of Care (TOC) Screening Note ? ? ?Patient Details  ?Name: Edward Pitts ?Date of Birth: November 23, 1960 ? ? ?Transition of Care (TOC) CM/SW Contact:    ?Laurena Slimmer, RN ?Phone Number: ?08/08/2021, 10:10 AM ? ? ? ?Transition of Care Department [facility]) has reviewed patient and no TOC needs have been identified at this time. We will continue to monitor patient advancement through interdisciplinary progression rounds. If new patient transition needs arise, please place a TOC consult. ? ? ? ?  ?  ? ? ?Patient Goals and CMS Choice ?  ?  ?  ? ?Expected Discharge Plan and Services ?  ?  ?  ?  ?  ?                ?  ?  ?  ?  ?  ?  ?  ?  ?  ?  ? ?Prior Living Arrangements/Services ?  ?  ?  ?       ?  ?  ?  ?  ? ?Activities of Daily Living ?Home Assistive Devices/Equipment: None ?ADL Screening (condition at time of admission) ?Patient's cognitive ability adequate to safely complete daily activities?: Yes ?Is the patient deaf or have difficulty hearing?: No ?Does the patient have difficulty seeing, even when wearing glasses/contacts?: No ?Does the patient have difficulty concentrating, remembering, or making decisions?: No ?Patient able to express need for assistance with ADLs?: Yes ?Does the patient have difficulty dressing or bathing?: Yes ?Independently performs ADLs?: Yes (appropriate for developmental age) ?Does the patient have difficulty walking or climbing stairs?: Yes ?Weakness of Legs: Both ?Weakness of Arms/Hands: None ? ?Permission Sought/Granted ?  ?  ?   ?   ?   ?   ? ?Emotional Assessment ?  ?  ?  ?  ?  ?  ? ?Admission diagnosis:  Elevated troponin [R77.8] ?NSTEMI (non-ST elevated myocardial infarction) ([place]) [I21.4] ?Chest pain  [R07.9] ?Chest pain, unspecified type [R07.9] ?Patient Active Problem List  ? Diagnosis Date Noted  ? Chest pain 08/05/2021  ? NSTEMI (non-ST elevated myocardial infarction) ([place]) 08/05/2021  ? Obesity, Class III, BMI 40-49.9 (morbid obesity) ([place]) 08/05/2021  ? Elevated serum creatinine 04/15/2021  ? Episodes of altered cognition 04/15/2021  ? Degenerative joint disease of knee 07/14/2020  ? Other specified visual disturbances 07/14/2020  ? Hx of total hip arthroplasty, left 07/14/2020  ? Exposure to combat 04/20/2020  ? Primary osteoarthritis of left hip 04/20/2020  ? Dizziness 12/22/2019  ? Arthritis of left knee 11/27/2019  ? Vitiligo 09/03/2019  ? Port-A-Cath in place 08/20/2019  ? Encounter for antineoplastic immunotherapy 08/06/2019  ? Malignant melanoma of overlapping sites [facility]) 04/23/2019  ? Goals of care, counseling/discussion 04/23/2019  ? 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 Near syncope 12/17/2018  ? Anemia in chronic kidney disease 12/11/2018  ? Benign hypertensive kidney disease with chronic kidney disease 12/11/2018  ? Stage 3a chronic kidney disease ([place]) 12/11/2018  ? AKI (acute kidney injury) ([place]) 08/24/2018  ? Acute hyperkalemia 08/24/2018  ? HTN (hypertension) 08/24/2018  ? HLD (hyperlipidemia) 08/24/2018  ? Seizures ([place]) 08/24/2018  ?  Complete heart block ([place]) 08/24/2018  ? Arthritis of wrist, left 06/12/2018  ? Bradycardia 06/04/2018  ? Chronic gouty arthropathy without tophi 03/13/2018  ? Positive ANA (antinuclear antibody) 03/05/2018  ? Swelling of joint of left wrist 03/05/2018  ? Coronary artery disease involving native coronary artery of native heart 07/09/2017  ? Palpitations 07/09/2017  ? Stable angina ([place]) 06/27/2017  ? Benign essential HTN 05/25/2017  ? LBBB (left bundle branch block) 05/25/2017  ? ?PCP:  Mechele Claude, FNP ?Pharmacy:   ?[id number] [place] - [address] ?[address] ?[id number]?Phone: [phone]:  [phone]? ? ? ? ?Social Determinants of Health (SDOH) Interventions ?  ? ?Readmission Risk Interventions ?   ? View : No data to display.  ?  ?  ?  ? ? ? ?

## 2021-08-08 NOTE — Assessment & Plan Note (Signed)
For [facility]. Stable. ?

## 2021-08-08 NOTE — Subjective & Objective (Signed)
CC: NSTEMI, obstructive CAD ?HPI: ?61 year old white male history of hypertension, hyperlipidemia, seizure disorder, history of complete heart block status post permanent pacemaker who was admitted over the weekend at [facility] due to NSTEMI.  Patient had a left heart cath today.  It showed obstructive left main disease with an 80% stenosis.  Patient transferred to [facility] for CABG. ? ?Patient not had any more pain. ? ?Patient states that on Friday he was mowing his lawn with a riding mower.  Started having shortness of breath.  He felt like an elephant was sitting on his chest.  He put his lawnmower away.  He did not finish cutting his grass.  He called his sister and then called EMS.  Was brought to the ER.  Troponins were elevated.  He waited over the weekend for his heart cath.  Heart cath showed obstructive disease. ?

## 2021-08-08 NOTE — Progress Notes (Addendum)
Report given via telephone to [facility] staff Donato Heinz ?

## 2021-08-08 NOTE — Assessment & Plan Note (Signed)
S/p NSTEMI this weekend. Obstructive LM disease on LHC today. ?

## 2021-08-08 NOTE — Progress Notes (Signed)
[facility] Encounter Note ? ?Patient: Deavin Forst / Admit Date: 08/05/2021 / Date of Encounter: 08/08/2021, 2:11 PM ? ? ?Subjective: ?Patient overall feels well today with no further episodes of chest discomfort.  Troponin level has increased from 23/182/272.  He is in a paced rhythm from previous history of complete heart block.  Medication management has helped him for stability and echocardiogram is pending.  He remains on heparin and no need for nitrates at this time ? ?5/15 patient has not had any further chest pain since admission.  Patient had a previous cardiac catheterization with minimal atherosclerosis of right coronary artery and a 40% stenosis of the distal left main in 2018. ?Cardiac catheterization today showing significant progression of distal left main atherosclerosis of 75%.  This is likely causing his new onset stable angina and/or non-ST elevation myocardial infarction. ? ?Review of Systems: ?Positive for: None ?Negative for: Vision change, hearing change, syncope, dizziness, nausea, vomiting,diarrhea, bloody stool, stomach pain, cough, congestion, diaphoresis, urinary frequency, urinary pain,skin lesions, skin rashes ?Others previously listed ? ?Objective: ?Telemetry: Paced rhythm ?Physical Exam: Blood pressure (!) 150/68, pulse 66, temperature 98.1 ?F (36.7 ?C), temperature source Oral, resp. rate (!) 24, height '5\' 8"'$  (1.727 m), weight 113.4 kg, SpO2 95 %. Body mass index is 38.01 kg/m?. ?General: Well developed, well nourished, in no acute distress. ?Head: Normocephalic, atraumatic, sclera non-icteric, no xanthomas, nares are without discharge. ?Neck: No apparent masses ?Lungs: Normal respirations with no wheezes, no rhonchi, no rales , no crackles  ? Heart: Regular rate and rhythm, normal S1 S2, no murmur, no rub, no gallop, PMI is normal size and placement, carotid upstroke normal without bruit, jugular venous pressure normal ?Abdomen: Soft, non-tender, non-distended  with normoactive bowel sounds. No hepatosplenomegaly. Abdominal aorta is normal size without bruit ?Extremities: No edema, no clubbing, no cyanosis, no ulcers,  ?Peripheral: 2+ radial, 2+ femoral, 2+ dorsal pedal pulses ?Neuro: Alert and oriented. Moves all extremities spontaneously. ?Psych:  Responds to questions appropriately with a normal affect. ? ? ?Intake/Output Summary (Last 24 hours) at 08/08/2021 1411 ?Last data filed at 08/08/2021 0750 ?Gross per 24 hour  ?Intake 467.33 ml  ?Output 800 ml  ?Net -332.67 ml  ? ? ? ?Inpatient Medications:  ?? [MAR Hold] allopurinol  300 mg Oral Daily  ?? [MAR Hold] aspirin EC  81 mg Oral Daily  ?? [MAR Hold] atorvastatin  20 mg Oral Daily  ?? [MAR Hold] clonazePAM  0.5 mg Oral BID  ?? [MAR Hold] lacosamide  100 mg Oral BID  ?? [MAR Hold] lisinopril  20 mg Oral Daily  ?? [MAR Hold] metoprolol succinate  25 mg Oral Daily  ?? [MAR Hold] multivitamin with minerals  1 tablet Oral Daily  ?? [MAR Hold] pantoprazole  40 mg Oral Daily  ?? [MAR Hold] sodium chloride flush  3 mL Intravenous Q12H  ?? [MAR Hold] sodium chloride flush  3 mL Intravenous Q12H  ? ?Infusions:  ?? [MAR Hold] sodium chloride    ?? sodium chloride    ?? sodium chloride 1 mL/kg/hr (08/08/21 1313)  ?? heparin Stopped (08/08/21 1320)  ? ? ?Labs: ?Recent Labs  ?  08/06/21 ?0500 08/08/21 ?3817  ?NA 142 142  ?K 4.2 4.1  ?CL 114* 107  ?CO2 24 26  ?GLUCOSE 116* 108*  ?BUN 28* 27*  ?CREATININE 1.37* 1.50*  ?CALCIUM 9.5 9.6  ? ? ?No results for input(s): AST, ALT, ALKPHOS, BILITOT, PROT, ALBUMIN in the last 72 hours. ? ?Recent Labs  ?  08/07/21 ?0429 08/08/21 ?1448  ?WBC 5.0 5.7  ?HGB 11.1* 11.4*  ?HCT 33.5* 33.8*  ?MCV 89.1 88.3  ?PLT 193 195  ? ? ?No results for input(s): CKTOTAL, CKMB, TROPONINI in the last 72 hours. ?Invalid input(s): POCBNP ?No results for input(s): HGBA1C in the last 72 hours.  ? ?Weights: ?Filed Weights  ? 08/05/21 1136 08/08/21 1310  ?Weight: 113.4 kg 113.4 kg  ? ? ? ?Radiology/Studies:  ?DG Chest  Port 1 View ? ?Result Date: 08/05/2021 ?CLINICAL DATA:  Chest pain and dizziness after mowing the yard. EXAM: PORTABLE CHEST 1 VIEW COMPARISON:  05/27/2021 FINDINGS: The right IJ power port is stable. The pacer wires are stable. The cardiac silhouette, mediastinal and hilar contours are within normal limits and unchanged. The lungs are clear. No pleural effusions. No pulmonary lesions. The bony thorax is intact. IMPRESSION: No acute cardiopulmonary findings. Electronically Signed   By: Marijo Sanes M.D.   On: 08/05/2021 12:13   ? ? Assessment and Recommendation  ?61 y.o. male with acute substernal chest discomfort with elevated troponin and paced rhythm concerning for acute on non-ST elevation myocardial infarction.  Patient does have minimal atherosclerosis by cardiac catheterization previously.   With progression of distal left main atherosclerosis to 75% likely causing his significant symptoms stable and progressive angina. ?1.  Continue heparin and aspirin for further possibility of acute coronary syndrome ?2.  Further discussion and shared decision making with cardiovascular team for treatment of distal left main coronary artery disease and angina with non-ST elevation myocardial infarction with probable coronary artery bypass grafting.   ?3.  Discussed with patient process of transfer for above. ?Signed, ?Serafina Royals M.D. [place] ? ?

## 2021-08-08 NOTE — Assessment & Plan Note (Signed)
Pt states he has finished chemo last year. Had f/u with dermatology later this year. ?

## 2021-08-08 NOTE — Assessment & Plan Note (Signed)
Stable, S/p PPM ?

## 2021-08-08 NOTE — Assessment & Plan Note (Addendum)
Stable. On vimpat 100 mg bid. Klonopin bid. ?

## 2021-08-08 NOTE — Assessment & Plan Note (Signed)
Stable

## 2021-08-08 NOTE — Assessment & Plan Note (Signed)
Continue toprol xl 25 mg daily. ?

## 2021-08-08 NOTE — Assessment & Plan Note (Signed)
Stable. Repeat BMP in AM given recent LHC. ?

## 2021-08-08 NOTE — Progress Notes (Addendum)
Updated report given to receiving RN Marcelyn Ditty. Pt released to care of [facility] staff with belongings in place (clothes, glasses, cell phone and personal cpap machine). ?

## 2021-08-08 NOTE — Discharge Summary (Signed)
?Physician Discharge Summary ?  ?Patient: Edward Pitts MRN: [id number] DOB: December 02, 1960  ?Admit date:     08/05/2021  ?Discharge date: 08/08/21  ?Discharge Physician: Max Sane  ? ?PCP: Mechele Claude, FNP  ? ?Recommendations at discharge:  ? ?Follow-up with outpatient providers as requested ? ?Discharge Diagnoses: ?Principal Problem: ?  NSTEMI (non-ST elevated myocardial infarction) ([place]) ?Active Problems: ?  Coronary artery disease involving native coronary artery of native heart ?  Complete heart block ([place]) ?  Stage 3a chronic kidney disease ([place]) ?  Seizures ([place]) ?  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  Obesity, Class III, BMI 40-49.9 (morbid obesity) ([place]) ? ?Hospital Course: ?61 year old male with history of recurrent malignant melanoma status postresection and adjuvant radiation, completed 2 years of immunotherapy, history of stage IIIa chronic kidney disease, morbid obesity (BMI 39), complete heart block status post pacemaker insertion, history of dizziness, seizure disorder, history of two-vessel coronary artery disease admitted for chest pain and worsening shortness of breath ? ?5/13: On heparin drip, possible cath on Monday, pending echo.  Patient remains symptomatic with shortness of breath but no chest pain ?5/14: Cardiac cath Tomorrow ?5/15: Cardiology recommends CABG and transfer to [facility] ? ?Assessment and Plan: ?* NSTEMI (non-ST elevated myocardial infarction) ([place]) ?Patient with a history of two-vessel coronary artery disease was admitted for chest pain with radiation to his left arm associated with nausea. ?Twelve-lead EKG did not show any acute findings but patient has an uptrending troponin level ?Continue heparin drip ?Continue metoprolol, aspirin and atorvastatin ?- Normal LV systolic function with mild septal hypokinesis due to pacemaker with ejection fraction of 50% ? -Cath on 5/15 showed significant progression of distal left main coronary atherosclerosis to 75 to 80%  and proximal right coronary artery to 40%.  Cardiology recommends transfer to [facility] for CABG due to significant progression of left main coronary artery disease, non-ST elevation myocardial infarction, and abnormal stress test 3/23 ?- Dr. Prescott Gum is aware ? ?Coronary artery disease involving native coronary artery of native heart ?Complete heart block ([place]) ?Status post pacemaker placement ? ? ?Stage 3a chronic kidney disease ([place]) ?At baseline.   ? ?Seizures ([place]) ?Continue Vimpat ?- seizure precautions ? 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 ? ?Obesity, Class III, BMI 40-49.9 (morbid obesity) ([place]) ?Complicates overall prognosis and care ?Lifestyle modification and exercise has been discussed  ? ? ? ? ?  ? ? ?Consultants: Cardiology ?Procedures performed: Cardiac cath on 5/15 ?Disposition:  [facility] ?Diet recommendation:  ?Discharge Diet Orders (From admission, onward)  ? ?  Start     Ordered  ? 08/08/21 0000  Diet - low sodium heart healthy       ? 08/08/21 1528  ? ?  ?  ? ?  ? ?Cardiac diet ?DISCHARGE MEDICATION: ?Allergies as of 08/08/2021   ? ?   Reactions  ? Ibuprofen Other (See Comments)  ? Affects kidneys  ? Levetiracetam   ? Other reaction(s): Dizziness, Headache  ? Nsaids   ? Affects kidneys  ? ?  ? ?  ?Medication List  ?  ? ?STOP taking these medications   ? ?nystatin powder ?Commonly known as: MYCOSTATIN/NYSTOP ?  ? ?  ? ?TAKE these medications   ? ?allopurinol 300 MG tablet ?Commonly known as: ZYLOPRIM ?Take 300 mg by mouth daily. ?  ?aspirin 81 MG EC  tablet ?Take 81 mg by mouth daily. ?  ?atorvastatin 20 MG tablet ?Commonly known as: LIPITOR ?Take 20 mg by mouth daily. ?  ?clonazePAM 0.5 MG tablet ?Commonly known as: KLONOPIN ?1 TABLET IN THE MORNING, 2 IN THE EVENING ?What changed: See the new instructions. ?  ?heparin 25000  UT/250ML infusion ?Inject 1,700 Units/hr into the vein continuous. ?  ?hydrocortisone 2.5 % ointment ?Apply topically 2 (two) times daily. ?  ?ketoconazole 2 % cream ?Commonly known as: NIZORAL ?Apply 1 application topically daily. ?  ?Lacosamide 100 MG Tabs ?Commonly known as: Vimpat ?Take 1 tablet (100 mg total) by mouth in the morning and at bedtime. ?  ?lidocaine-prilocaine cream ?Commonly known as: EMLA ?Apply 1 application. topically as needed. Apply small amount of cream to port site approx 1-2 hours prior to appointment. ?  ?lisinopril 20 MG tablet ?Commonly known as: ZESTRIL ?Take 20 mg by mouth daily. ?  ?LORazepam 2 MG tablet ?Commonly known as: Ativan ?Take 1 tablet (2 mg total) by mouth every 8 (eight) hours as needed for seizure. ?  ?metoprolol succinate 25 MG 24 hr tablet ?Commonly known as: TOPROL-XL ?Take 25 mg by mouth daily. ?  ?multivitamin with minerals Tabs tablet ?Take 1 tablet by mouth daily. Centrum Silver ?  ?omeprazole 20 MG capsule ?Commonly known as: PRILOSEC ?Take 1 capsule (20 mg total) by mouth daily. ?  ?ondansetron 4 MG tablet ?Commonly known as: ZOFRAN ?TAKE 1 TABLET BY MOUTH EVERY 8 HOURS AS NEEDED FOR NAUSEA AND VOMITING ?What changed: See the new instructions. ?  ? ?  ? ? ?Discharge Exam: ?Filed Weights  ? 08/05/21 1136 08/08/21 1310  ?Weight: 113.4 kg 113.58 kg  ? ?61 year old obese male not very comfortable.  Requesting a new mattress as the bed is not comfortable for him ?Lungs clear to auscultation ?Heart regular rate and rhythm ?Abdomen soft, benign ?Extremities 1+ pedal edema bilaterally ?Neuro alert and oriented, nonfocal ?Skin no rash or lesion ?Psych normal mood and affect ? ?Condition at discharge: fair ? ?The results of significant diagnostics from this hospitalization (including imaging, microbiology, ancillary and laboratory) are listed below for reference.  ? ?Imaging Studies: ?CARDIAC CATHETERIZATION ? ?Result Date: 08/08/2021 ?  Mid RCA lesion is 45% stenosed.    Mid LM lesion is 80% stenosed.   The left ventricular systolic function is normal.   LV end diastolic pressure is normal.   The left ventricular ejection fraction is greater than 65% by visual estimate. 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 Normal LV systolic function with mild septal hypokinesis due to pacemaker with ejection fraction of 50% Significant progression of distal left main coronary atherosclerosis to 75 to 80% and proximal right coronary artery to 40% Plan Proceed to coronary artery bypass grafting due to significant progression of left main coronary artery disease, non-ST elevation myocardial infarction, and abnormal stress test 3/23 High intensity cholesterol therapy Cardiac rehabilitation Further treatment options after above  ? ?DG Chest Port 1 View ? ?Result Date: 08/05/2021 ?CLINICAL DATA:  Chest pain and dizziness after mowing the yard. EXAM: PORTABLE CHEST 1 VIEW COMPARISON:  05/27/2021 FINDINGS: The right IJ power port is stable. The pacer wires are stable. The cardiac silhouette, mediastinal and hilar contours are within normal limits and unchanged. The lungs are clear. No pleural effusions. No pulmonary lesions. The bony thorax is intact. IMPRESSION: No acute cardiopulmonary findings. Electronically Signed  By: Marijo Sanes M.D.   On: 08/05/2021 12:13   ? ?Microbiology: ?Results for orders placed or performed during the hospital encounter of 07/12/20  ?SARS CORONAVIRUS 2 (TAT 6-24 HRS) Nasopharyngeal Nasopharyngeal Swab     Status: None  ? Collection Time: 07/12/20  8:37 AM  ? Specimen: Nasopharyngeal Swab  ?Result Value Ref Range Status  ? SARS Coronavirus 2 NEGATIVE NEGATIVE Final  ?  Comment: (NOTE) ?SARS-CoV-2 target nucleic acids are NOT DETECTED. ? ?The SARS-CoV-2 RNA is generally detectable in upper and  lower ?respiratory specimens during the acute phase of infection. Negative ?results do not preclude SARS-CoV-2 infection, do not rule out ?co-infections with other pathogens, and should not be used as the

## 2021-08-08 NOTE — Assessment & Plan Note (Signed)
Admit to inpatient. CTS to see patient tomorrow and schedule pt for CABG. Continue with IV heparin. Gentle IVF overnight due to CKD and LHC today. Repeat BMP in AM. Continue ASA and lipitor. ?

## 2021-08-08 NOTE — H&P (Signed)
?History and Physical  ? ? ?Abdulahi Schor WNI:[id number] DOB: Oct 22, 1960 DOA: 08/08/2021 ? ?DOS: the patient was seen and examined on 08/08/2021 ? ?PCP: Mechele Claude, FNP  ? ?Patient coming from:  transfer from [place] ? ?I have personally briefly reviewed patient's old medical records in [place] ? ?CC: NSTEMI, obstructive CAD ?HPI: ?61 year old white male history of hypertension, hyperlipidemia, seizure disorder, history of complete heart block status post permanent pacemaker who was admitted over the weekend at [facility] due to NSTEMI.  Patient had a left heart cath today.  It showed obstructive left main disease with an 80% stenosis.  Patient transferred to [facility] for CABG. ? ?Patient not had any more pain. ? ?Patient states that on Friday he was mowing his lawn with a riding mower.  Started having shortness of breath.  He felt like an elephant was sitting on his chest.  He put his lawnmower away.  He did not finish cutting his grass.  He called his sister and then called EMS.  Was brought to the ER.  Troponins were elevated.  He waited over the weekend for his heart cath.  Heart cath showed obstructive disease.  ? ? ?Review of Systems:  ?Review of Systems  ?Constitutional: Negative.   ?HENT: Negative.    ?Eyes: Negative.   ?Respiratory: Negative.    ?Cardiovascular: Negative.   ?Gastrointestinal: Negative.   ?Genitourinary: Negative.   ?Musculoskeletal: Negative.   ?Skin: Negative.   ?Neurological: Negative.   ?Endo/Heme/Allergies: Negative.   ?Psychiatric/Behavioral: Negative.    ?All other systems reviewed and are negative. ? ?Past Medical History:  ?Diagnosis Date  ? Anemia   ? iron treatments  ? Anxiety   ? Aortic atherosclerosis ([place])   ? Arthritis   ? 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 left groin, resected, radiation  ? Cataract   ? Complication of anesthesia   ? PONV  ? Coronary artery disease   ? Dizziness of unknown etiology   ? has led to seizures and passing out.  ?  Family history of adverse reaction to anesthesia   ? PONV mother  ? GERD (gastroesophageal reflux disease)   ? History of complete heart block   ? PPM placed  ? Hyperlipidemia   ? Hypertension   ? LBBB (left bundle branch block)   ? Lymphedema of left leg   ? uses thigh high compression stockings  ? Melanoma ([place]) 2012  ? skin cancer, left thigh  ? OSA on CPAP   ? PONV (postoperative nausea and vomiting) 04/16/2019  ? Port-A-Cath in place   ? RIGHT chest wall  ? Presence of cardiac pacemaker   ? Medtronic  ? Seizures ([place])   ? still has episodes of dizziness. last event 1 month ago (march 2022) and will pass out. takes clonazepam  ? ? ?Past Surgical History:  ?Procedure Laterality Date  ? CT RADIATION THERAPY GUIDE    ? left groin  ? dental implant    ? permanent implant  ? KNEE SURGERY Left   ? arthroscopy  ? LEFT HEART CATH AND CORONARY ANGIOGRAPHY Left 06/29/2017  ? Procedure: LEFT HEART CATH AND CORONARY ANGIOGRAPHY;  Surgeon: [name], MD;  Location: [place] [facility];  Service: Cardiovascular;  Laterality: Left;  ? LYMPH NODE DISSECTION Left 04/16/2019  ? Procedure: Left inguinal Lymph Node Dissection;  Surgeon: [name], MD;  Location: [place];  Service: General;  Laterality: Left;  ? MELANOMA EXCISION Left 04/16/2019  ?  Procedure: MELANOMA EXCISION LEFT GROIN MASS;  Surgeon: [name], MD;  Location: [place];  Service: General;  Laterality: Left;  ? MELANOMA EXCISION WITH SENTINEL LYMPH NODE BIOPSY Left 2012  ? Left calf   ? PACEMAKER INSERTION N/A 08/26/2018  ? Procedure: INSERTION PACEMAKER;  Surgeon: [name], MD;  Location: [facility];  Service: Cardiovascular;  Laterality: N/A;  ? PORTA CATH INSERTION N/A 08/26/2019  ? Procedure: PORTA CATH INSERTION;  Surgeon: Katha Cabal, MD;  Location: [place] [facility];  Service: Cardiovascular;  Laterality: N/A;  ? SUPERFICIAL LYMPH NODE BIOPSY / EXCISION Left 2020  ? lymph nodes removed around left groin melanoma site  ? TEMPORARY  PACEMAKER N/A 08/25/2018  ? Procedure: TEMPORARY PACEMAKER;  Surgeon: [name], MD;  Location: [place] [facility];  Service: Cardiovascular;  Laterality: N/A;  ? TOTAL HIP ARTHROPLASTY Left 07/14/2020  ? Procedure: TOTAL HIP ARTHROPLASTY;  Surgeon: Dereck Leep, MD;  Location: [facility];  Service: Orthopedics;  Laterality: Left;  ? ? ? reports that he has never smoked. He has never used smokeless tobacco. He reports that he does not drink alcohol and does not use drugs. ? ?Allergies  ?Allergen Reactions  ? Ibuprofen Other (See Comments)  ?  Affects kidneys  ? Levetiracetam   ?  Other reaction(s): Dizziness, Headache  ? Nsaids   ?  Affects kidneys  ? ? ?Family History  ?Problem Relation Age of Onset  ? Cancer Paternal Grandmother   ? ? ?Prior to Admission medications   ?Medication Sig Start Date End Date Taking? Authorizing Provider  ?allopurinol (ZYLOPRIM) 300 MG tablet Take 300 mg by mouth daily. 05/24/21   [provider]  ?aspirin 81 MG EC tablet Take 81 mg by mouth daily.    [provider]  ?atorvastatin (LIPITOR) 20 MG tablet Take 20 mg by mouth daily. 07/15/18   [provider]  ?clonazePAM (KLONOPIN) 0.5 MG tablet 1 TABLET IN THE MORNING, 2 IN THE EVENING ?Patient taking differently: Take 0.5-1 mg by mouth 2 (two) times daily. 1 tablet in the morning, 2 in the evening 07/21/21   Suzzanne Cloud, NP  ?heparin 25000 UT/250ML infusion Inject 1,700 Units/hr into the vein continuous. 08/08/21   Max Sane, MD  ?hydrocortisone 2.5 % ointment Apply topically 2 (two) times daily.    [provider]  ?ketoconazole (NIZORAL) 2 % cream Apply 1 application topically daily.    [provider]  ?Lacosamide (VIMPAT) 100 MG TABS Take 1 tablet (100 mg total) by mouth in the morning and at bedtime. 07/26/21   Ventura Sellers, MD  ?lidocaine-prilocaine (EMLA) cream Apply 1 application. topically as needed. Apply small amount of cream to port site approx 1-2 hours prior to  appointment. 07/21/21   [name], MD  ?lisinopril (ZESTRIL) 20 MG tablet Take 20 mg by mouth daily.    [provider]  ?LORazepam (ATIVAN) 2 MG tablet Take 1 tablet (2 mg total) by mouth every 8 (eight) hours as needed for seizure. 07/26/21   Ventura Sellers, MD  ?metoprolol succinate (TOPROL-XL) 25 MG 24 hr tablet Take 25 mg by mouth daily.  12/15/18   [provider]  ?Multiple Vitamin (MULTIVITAMIN WITH MINERALS) TABS tablet Take 1 tablet by mouth daily. Centrum Silver    [provider]  ?omeprazole (PRILOSEC) 20 MG capsule Take 1 capsule (20 mg total) by mouth daily. 08/08/21   [name], MD  ?ondansetron (ZOFRAN) 4 MG tablet TAKE 1 TABLET  BY MOUTH EVERY 8 HOURS AS NEEDED FOR NAUSEA AND VOMITING ?Patient taking differently: Take 4 mg by mouth every 8 (eight) hours as needed for vomiting or nausea. 04/11/21   [name], MD  ? ? ?Physical Exam: ?Vitals:  ? 08/08/21 2156 08/08/21 2203  ?BP:  (!) 136/54  ?Pulse:  61  ?Resp:  18  ?Temp:  98.4 ?F (36.9 ?C)  ?TempSrc:  Oral  ?SpO2:  94%  ?Weight: 112.3 kg   ?Height: '5\' 8"'$  (1.727 m)   ? ? ?Physical Exam ?Vitals and nursing note reviewed.  ?Constitutional:   ?   General: He is not in acute distress. ?   Appearance: Normal appearance. He is obese. He is not ill-appearing, toxic-appearing or diaphoretic.  ?HENT:  ?   Head: Normocephalic and atraumatic.  ?   Nose: Nose normal. No rhinorrhea.  ?Eyes:  ?   General: No scleral icterus. ?Cardiovascular:  ?   Rate and Rhythm: Normal rate and regular rhythm.  ?   Pulses: Normal pulses.  ?   Comments: Paced rhythm on telemetry ?Pulmonary:  ?   Effort: Pulmonary effort is normal. No respiratory distress.  ?   Breath sounds: No wheezing or rales.  ?Abdominal:  ?   General: Bowel sounds are normal. There is no distension.  ?   Palpations: Abdomen is soft.  ?   Tenderness: There is no abdominal tenderness. There is no guarding.  ?Musculoskeletal:  ?   Right lower leg: No edema.  ?   Left lower leg: No edema.   ?Skin: ?   General: Skin is warm and dry.  ?   Capillary Refill: Capillary refill takes less than 2 seconds.  ?Neurological:  ?   General: No focal deficit present.  ?   Mental Status: He is alert and oriented

## 2021-08-08 NOTE — Assessment & Plan Note (Signed)
Stable. Continue lipitor. 

## 2021-08-08 NOTE — Progress Notes (Signed)
Orders to resume heparin gtt per Ivor Costa  ?

## 2021-08-08 NOTE — Consult Note (Signed)
ANTICOAGULATION CONSULT NOTE ? ?Pharmacy Consult for Heparin infusion ?Indication: chest pain/ACS ? ?Allergies  ?Allergen Reactions  ? Ibuprofen Other (See Comments)  ?  Affects kidneys  ? Levetiracetam   ?  Other reaction(s): Dizziness, Headache  ? Nsaids   ?  Affects kidneys  ? ? ?Patient Measurements: ?Weight: 113.4 kg (250 lb) ?IBW 66.1kg ?Heparin Dosing Weight: 91.9kg ? ?Vital Signs: ?Temp: 98.3 ?F (36.8 ?C) (05/15 0610) ?Temp Source: Oral (05/15 8341) ?BP: 138/66 (05/15 0610) ?Pulse Rate: 68 (05/15 0610) ? ?Labs: ?Recent Labs  ?  08/05/21 ?1132 08/05/21 ?1322 08/06/21 ?0500 08/06/21 ?9622 08/06/21 ?1510 08/06/21 ?1734 08/06/21 ?2043 08/07/21 ?0429 08/08/21 ?2979  ?HGB 11.0*  --  11.3*  --   --   --   --  11.1* 11.4*  ?HCT 33.1*  --  34.4*  --   --   --   --  33.5* 33.8*  ?PLT 208  --  198  --   --   --   --  193 195  ?APTT  --   --  51*  --   --   --   --   --   --   ?LABPROT  --   --   --  13.8  --   --   --   --   --   ?INR  --   --   --  1.1  --   --   --   --   --   ?HEPARINUNFRC  --    < > 0.26*  --  0.37  --  0.31 0.31 0.24*  ?CREATININE 1.57*  --  1.37*  --   --   --   --   --  1.50*  ?TROPONINIHS 23*   < >  --  272* 200* 184*  --   --   --   ? < > = values in this interval not displayed.  ? ? ?Estimated Creatinine Clearance: 62.2 mL/min (A) (by C-G formula based on SCr of 1.5 mg/dL (H)). ? ?Medical History: ?Past Medical History:  ?Diagnosis Date  ? Anemia   ? iron treatments  ? Anxiety   ? Aortic atherosclerosis ([place])   ? Arthritis   ? 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 left groin, resected, radiation  ? Cataract   ? Complication of anesthesia   ? PONV  ? Coronary artery disease   ? Dizziness of unknown etiology   ? has led to seizures and passing out.  ? Family history of adverse reaction to anesthesia   ? PONV mother  ? GERD (gastroesophageal reflux disease)   ? History of complete heart block   ? PPM placed  ? Hyperlipidemia   ? Hypertension   ? LBBB (left bundle branch block)   ? Lymphedema of left leg    ? uses thigh high compression stockings  ? Melanoma ([place]) 2012  ? skin cancer, left thigh  ? OSA on CPAP   ? PONV (postoperative nausea and vomiting) 04/16/2019  ? Port-A-Cath in place   ? RIGHT chest wall  ? Presence of cardiac pacemaker   ? Medtronic  ? Seizures ([place])   ? still has episodes of dizziness. last event 1 month ago (march 2022) and will pass out. takes clonazepam  ? ? ?Medications:  ?Scheduled:  ? allopurinol  300 mg Oral Daily  ? aspirin EC  81 mg Oral Daily  ? atorvastatin  20 mg Oral Daily  ? clonazePAM  0.5 mg Oral BID  ? lacosamide  100 mg Oral BID  ? lisinopril  20 mg Oral Daily  ? metoprolol succinate  25 mg Oral Daily  ? multivitamin with minerals  1 tablet Oral Daily  ? pantoprazole  40 mg Oral Daily  ? sodium chloride flush  3 mL Intravenous Q12H  ? sodium chloride flush  3 mL Intravenous Q12H  ? ? ?Assessment: ?PMH includes CAD, HTN, LBBB with pacemaker placement. Patient admitted to ED with chest pain. Pharmacy consulted to manage heparin infusion for ACS/STEMI. ? ?5/12 2144 HL 0.25  ?5/13 0500 HL 0.26 ?5/13 1510 HL 0.37 ?5/13 2043 HL 0.31 ?5/14 0429 HL 0.31  ?5/15 0523 HL 0.24  ? ?Goal of Therapy:  ?Heparin level 0.3-0.7 units/ml ?Monitor platelets by anticoagulation protocol: Yes ?  ?Plan:  ?Heparin level is subtherapeutic. Will give heparin bolus of 1400 units x 1 and increase heparin infusion to 1700 units/hr. Recheck heparin level in 6 hours. CBC daily while on heparin.  ? ? ?Oswald Hillock, PharmD ?Clinical Pharmacist  ?08/08/2021 6:22 AM  ? ? ? ? ?

## 2021-08-08 NOTE — Progress Notes (Signed)
*  PRELIMINARY RESULTS* ?Echocardiogram ?2D Echocardiogram has been performed. ? ?Gaylord Seydel, [place] Side ?08/08/2021, 8:10 AM ?

## 2021-08-08 NOTE — Progress Notes (Addendum)
Pt made aware of pending transfer to [facility] hospital tonight. Pt notified family member of pending transfer. ?

## 2021-08-09 ENCOUNTER — Encounter: Payer: Self-pay | Admitting: Internal Medicine

## 2021-08-09 ENCOUNTER — Inpatient Hospital Stay (HOSPITAL_COMMUNITY)

## 2021-08-09 DIAGNOSIS — I214 Non-ST elevation (NSTEMI) myocardial infarction: Secondary | ICD-10-CM

## 2021-08-09 DIAGNOSIS — Z01810 Encounter for preprocedural cardiovascular examination: Secondary | ICD-10-CM

## 2021-08-09 DIAGNOSIS — I25110 Atherosclerotic heart disease of native coronary artery with unstable angina pectoris: Secondary | ICD-10-CM

## 2021-08-09 DIAGNOSIS — I2510 Atherosclerotic heart disease of native coronary artery without angina pectoris: Secondary | ICD-10-CM

## 2021-08-09 LAB — PULMONARY FUNCTION TEST
FEF 25-75 Pre: 1.49 L/sec
FEF2575-%Pred-Pre: 54 %
FEV1-%Pred-Pre: 71 %
FEV1-Pre: 2.36 L
FEV1FVC-%Pred-Pre: 96 %
FEV6-%Pred-Pre: 73 %
FEV6-Pre: 3.06 L
FEV6FVC-%Pred-Pre: 99 %
FVC-%Pred-Pre: 73 %
FVC-Pre: 3.25 L
Pre FEV1/FVC ratio: 73 %
Pre FEV6/FVC Ratio: 94 %

## 2021-08-09 LAB — COMPREHENSIVE METABOLIC PANEL
ALT: 56 U/L — ABNORMAL HIGH (ref 0–44)
AST: 40 U/L (ref 15–41)
Albumin: 3.5 g/dL (ref 3.5–5.0)
Alkaline Phosphatase: 86 U/L (ref 38–126)
Anion gap: 5 (ref 5–15)
BUN: 21 mg/dL (ref 8–23)
CO2: 24 mmol/L (ref 22–32)
Calcium: 8.7 mg/dL — ABNORMAL LOW (ref 8.9–10.3)
Chloride: 110 mmol/L (ref 98–111)
Creatinine, Ser: 1.57 mg/dL — ABNORMAL HIGH (ref 0.61–1.24)
GFR, Estimated: 50 mL/min — ABNORMAL LOW (ref >60)
Glucose, Bld: 108 mg/dL — ABNORMAL HIGH (ref 70–99)
Potassium: 3.8 mmol/L (ref 3.5–5.1)
Sodium: 139 mmol/L (ref 135–145)
Total Bilirubin: 0.6 mg/dL (ref 0.3–1.2)
Total Protein: 6.4 g/dL — ABNORMAL LOW (ref 6.5–8.1)

## 2021-08-09 LAB — HEPARIN LEVEL (UNFRACTIONATED)
Heparin Unfractionated: 0.12 IU/mL — ABNORMAL LOW (ref 0.30–0.70)
Heparin Unfractionated: 0.39 IU/mL (ref 0.30–0.70)

## 2021-08-09 LAB — RESP PANEL BY RT-PCR (FLU A&B, COVID) ARPGX2
Influenza A by PCR: NEGATIVE
Influenza B by PCR: NEGATIVE
SARS Coronavirus 2 by RT PCR: NEGATIVE

## 2021-08-09 LAB — BLOOD GAS, ARTERIAL
Acid-base deficit: 0.7 mmol/L (ref 0.0–2.0)
Bicarbonate: 24.2 mmol/L (ref 20.0–28.0)
O2 Saturation: 97.7 %
Patient temperature: 36.9
pCO2 arterial: 40 mmHg (ref 32–48)
pH, Arterial: 7.39 (ref 7.35–7.45)
pO2, Arterial: 82 mmHg — ABNORMAL LOW (ref 83–108)

## 2021-08-09 LAB — HEPATIC FUNCTION PANEL
ALT: 61 U/L — ABNORMAL HIGH (ref 0–44)
AST: 40 U/L (ref 15–41)
Albumin: 3.6 g/dL (ref 3.5–5.0)
Alkaline Phosphatase: 91 U/L (ref 38–126)
Bilirubin, Direct: <0.1 mg/dL (ref 0.0–0.2)
Total Bilirubin: 0.5 mg/dL (ref 0.3–1.2)
Total Protein: 6.6 g/dL (ref 6.5–8.1)

## 2021-08-09 LAB — CBC WITH DIFFERENTIAL/PLATELET
Abs Immature Granulocytes: 0.05 10*3/uL (ref 0.00–0.07)
Basophils Absolute: 0 10*3/uL (ref 0.0–0.1)
Basophils Relative: 1 %
Eosinophils Absolute: 0.2 10*3/uL (ref 0.0–0.5)
Eosinophils Relative: 3 %
HCT: 31.8 % — ABNORMAL LOW (ref 39.0–52.0)
Hemoglobin: 10.7 g/dL — ABNORMAL LOW (ref 13.0–17.0)
Immature Granulocytes: 1 %
Lymphocytes Relative: 22 %
Lymphs Abs: 1.2 10*3/uL (ref 0.7–4.0)
MCH: 30.5 pg (ref 26.0–34.0)
MCHC: 33.6 g/dL (ref 30.0–36.0)
MCV: 90.6 fL (ref 80.0–100.0)
Monocytes Absolute: 0.6 10*3/uL (ref 0.1–1.0)
Monocytes Relative: 11 %
Neutro Abs: 3.3 10*3/uL (ref 1.7–7.7)
Neutrophils Relative %: 62 %
Platelets: 191 10*3/uL (ref 150–400)
RBC: 3.51 MIL/uL — ABNORMAL LOW (ref 4.22–5.81)
RDW: 13.2 % (ref 11.5–15.5)
WBC: 5.3 10*3/uL (ref 4.0–10.5)
nRBC: 0 % (ref 0.0–0.2)

## 2021-08-09 LAB — TSH: TSH: 3.342 u[IU]/mL (ref 0.350–4.500)

## 2021-08-09 LAB — SURGICAL PCR SCREEN
MRSA, PCR: NEGATIVE
Staphylococcus aureus: NEGATIVE

## 2021-08-09 LAB — HEMOGLOBIN A1C
Hgb A1c MFr Bld: 5.7 % — ABNORMAL HIGH (ref 4.8–5.6)
Mean Plasma Glucose: 116.89 mg/dL

## 2021-08-09 LAB — PREPARE RBC (CROSSMATCH)

## 2021-08-09 LAB — MAGNESIUM: Magnesium: 1.6 mg/dL — ABNORMAL LOW (ref 1.7–2.4)

## 2021-08-09 MED ORDER — METOPROLOL TARTRATE 12.5 MG HALF TABLET
12.5000 mg | ORAL_TABLET | Freq: Once | ORAL | Status: AC
  Administered 2021-08-10: 12.5 mg via ORAL
  Filled 2021-08-09: qty 1

## 2021-08-09 MED ORDER — TRANEXAMIC ACID (OHS) BOLUS VIA INFUSION
15.0000 mg/kg | INTRAVENOUS | Status: AC
  Administered 2021-08-10: 1680 mg via INTRAVENOUS
  Filled 2021-08-09: qty 1680

## 2021-08-09 MED ORDER — DIAZEPAM 2 MG PO TABS
2.0000 mg | ORAL_TABLET | Freq: Once | ORAL | Status: AC
  Administered 2021-08-10: 2 mg via ORAL
  Filled 2021-08-09: qty 1

## 2021-08-09 MED ORDER — CHLORHEXIDINE GLUCONATE 4 % EX LIQD
60.0000 mL | Freq: Once | CUTANEOUS | Status: AC
  Administered 2021-08-10: 4 via TOPICAL
  Filled 2021-08-09: qty 60

## 2021-08-09 MED ORDER — SODIUM CHLORIDE 0.9% FLUSH
10.0000 mL | Freq: Two times a day (BID) | INTRAVENOUS | Status: DC
  Administered 2021-08-09: 20 mL
  Administered 2021-08-09: 10 mL
  Administered 2021-08-09: 20 mL

## 2021-08-09 MED ORDER — SODIUM CHLORIDE 0.9 % IV SOLN
1.5000 mg/kg/h | INTRAVENOUS | Status: AC
  Administered 2021-08-10: 1.5 mg/kg/h via INTRAVENOUS
  Filled 2021-08-09: qty 25

## 2021-08-09 MED ORDER — POTASSIUM CHLORIDE 2 MEQ/ML IV SOLN
80.0000 meq | INTRAVENOUS | Status: DC
  Filled 2021-08-09: qty 40

## 2021-08-09 MED ORDER — VANCOMYCIN HCL 1500 MG/300ML IV SOLN
1500.0000 mg | INTRAVENOUS | Status: AC
  Administered 2021-08-10: 1500 mg via INTRAVENOUS
  Filled 2021-08-09: qty 300

## 2021-08-09 MED ORDER — MAGNESIUM GLUCONATE 500 MG PO TABS
500.0000 mg | ORAL_TABLET | Freq: Every day | ORAL | Status: DC
Start: 2021-08-09 — End: 2021-08-11
  Administered 2021-08-09: 500 mg via ORAL
  Filled 2021-08-09 (×4): qty 1

## 2021-08-09 MED ORDER — NITROGLYCERIN IN D5W 200-5 MCG/ML-% IV SOLN
2.0000 ug/min | INTRAVENOUS | Status: DC
  Filled 2021-08-09: qty 250

## 2021-08-09 MED ORDER — HEPARIN SODIUM (PORCINE) 1000 UNIT/ML IJ SOLN
INTRAMUSCULAR | Status: DC
Start: 2021-08-10 — End: 2021-08-10
  Filled 2021-08-09: qty 2.5

## 2021-08-09 MED ORDER — EPINEPHRINE HCL 5 MG/250ML IV SOLN IN NS
0.0000 ug/min | INTRAVENOUS | Status: DC
  Filled 2021-08-09: qty 250

## 2021-08-09 MED ORDER — CHLORHEXIDINE GLUCONATE 4 % EX LIQD
60.0000 mL | Freq: Once | CUTANEOUS | Status: AC
  Administered 2021-08-09: 4 via TOPICAL
  Filled 2021-08-09 (×2): qty 60

## 2021-08-09 MED ORDER — MAGNESIUM SULFATE 2 GM/50ML IV SOLN: 2.0000 g | Freq: Once | INTRAVENOUS | Status: DC

## 2021-08-09 MED ORDER — ASPIRIN EC 81 MG PO TBEC
81.0000 mg | DELAYED_RELEASE_TABLET | Freq: Every day | ORAL | Status: DC
  Administered 2021-08-09: 81 mg via ORAL
  Filled 2021-08-09: qty 1

## 2021-08-09 MED ORDER — TRANEXAMIC ACID (OHS) PUMP PRIME SOLUTION
2.0000 mg/kg | INTRAVENOUS | Status: DC
  Filled 2021-08-09: qty 2.24

## 2021-08-09 MED ORDER — DEXMEDETOMIDINE HCL IN NACL 400 MCG/100ML IV SOLN
0.1000 ug/kg/h | INTRAVENOUS | Status: AC
  Administered 2021-08-10: .5 ug/kg/h via INTRAVENOUS
  Administered 2021-08-10: .7 ug/kg/h via INTRAVENOUS
  Filled 2021-08-09: qty 100

## 2021-08-09 MED ORDER — HEPARIN (PORCINE) 25000 UT/250ML-% IV SOLN
1900.0000 [IU]/h | INTRAVENOUS | Status: DC
Start: 2021-08-09 — End: 2021-08-10
  Administered 2021-08-09: 1700 [IU]/h via INTRAVENOUS
  Administered 2021-08-09 – 2021-08-10 (×2): 1900 [IU]/h via INTRAVENOUS
  Filled 2021-08-09 (×3): qty 250

## 2021-08-09 MED ORDER — CHLORHEXIDINE GLUCONATE CLOTH 2 % EX PADS: 6.0000 | MEDICATED_PAD | Freq: Every day | CUTANEOUS | Status: DC

## 2021-08-09 MED ORDER — PHENYLEPHRINE HCL-NACL 20-0.9 MG/250ML-% IV SOLN
30.0000 ug/min | INTRAVENOUS | Status: AC
  Administered 2021-08-10: 20 ug/min via INTRAVENOUS
  Filled 2021-08-09: qty 250

## 2021-08-09 MED ORDER — CEFAZOLIN SODIUM-DEXTROSE 2-4 GM/100ML-% IV SOLN
2.0000 g | INTRAVENOUS | Status: AC
  Administered 2021-08-10: 2 g via INTRAVENOUS
  Filled 2021-08-09: qty 100

## 2021-08-09 MED ORDER — MAGNESIUM SULFATE 50 % IJ SOLN
40.0000 meq | INTRAMUSCULAR | Status: DC
  Filled 2021-08-09: qty 9.85

## 2021-08-09 MED ORDER — SODIUM CHLORIDE 0.9% FLUSH: 10.0000 mL | INTRAVENOUS | Status: DC | PRN

## 2021-08-09 MED ORDER — BISACODYL 5 MG PO TBEC
5.0000 mg | DELAYED_RELEASE_TABLET | Freq: Once | ORAL | Status: DC
  Filled 2021-08-09: qty 1

## 2021-08-09 MED ORDER — HEPARIN 30,000 UNITS/1000 ML (OHS) CELLSAVER SOLUTION
Status: DC
  Filled 2021-08-09: qty 1000

## 2021-08-09 MED ORDER — MILRINONE LACTATE IN DEXTROSE 20-5 MG/100ML-% IV SOLN
0.3000 ug/kg/min | INTRAVENOUS | Status: DC
  Filled 2021-08-09: qty 100

## 2021-08-09 MED ORDER — INSULIN REGULAR(HUMAN) IN NACL 100-0.9 UT/100ML-% IV SOLN
INTRAVENOUS | Status: AC
  Administered 2021-08-10: .7 [IU]/h via INTRAVENOUS
  Filled 2021-08-09: qty 100

## 2021-08-09 MED ORDER — NOREPINEPHRINE 4 MG/250ML-% IV SOLN
0.0000 ug/min | INTRAVENOUS | Status: DC
  Filled 2021-08-09: qty 250

## 2021-08-09 MED ORDER — CHLORHEXIDINE GLUCONATE CLOTH 2 % EX PADS
6.0000 | MEDICATED_PAD | Freq: Every day | CUTANEOUS | Status: DC
Start: 2021-08-09 — End: 2021-08-10
  Administered 2021-08-09 – 2021-08-10 (×2): 6 via TOPICAL

## 2021-08-09 MED ORDER — ALPRAZOLAM 0.25 MG PO TABS: 0.2500 mg | ORAL_TABLET | ORAL | Status: DC | PRN

## 2021-08-09 MED ORDER — MAGNESIUM SULFATE 4 GM/100ML IV SOLN
4.0000 g | Freq: Once | INTRAVENOUS | Status: AC
  Administered 2021-08-09: 4 g via INTRAVENOUS
  Filled 2021-08-09: qty 100

## 2021-08-09 MED ORDER — CHLORHEXIDINE GLUCONATE 0.12 % MT SOLN
15.0000 mL | Freq: Once | OROMUCOSAL | Status: AC
  Administered 2021-08-10: 15 mL via OROMUCOSAL
  Filled 2021-08-09: qty 15

## 2021-08-09 MED ORDER — TEMAZEPAM 7.5 MG PO CAPS: 15.0000 mg | ORAL_CAPSULE | Freq: Once | ORAL | Status: DC | PRN

## 2021-08-09 NOTE — Progress Notes (Signed)
Pt self administers home CPAP. 

## 2021-08-09 NOTE — Progress Notes (Signed)
Pre-CABG ultrasound study completed.  ? ?Please see CV Proc for preliminary results.  ? ?Darlin Coco, RDMS, RVT ? ?

## 2021-08-09 NOTE — Progress Notes (Signed)
ANTICOAGULATION CONSULT NOTE - Initial Consult ? ?Pharmacy Consult for heparin ?Indication:  CAD awaiting CABG consult ? ?Allergies  ?Allergen Reactions  ? Ibuprofen Other (See Comments)  ?  Affects kidneys  ? Levetiracetam   ?  Other reaction(s): Dizziness, Headache  ? Nsaids   ?  Affects kidneys  ? ? ?Patient Measurements: ?Height: '5\' 8"'$  (172.7 cm) ?Weight: 112.3 kg (247 lb 8 oz) ?IBW/kg (Calculated) : 68.4 ?Heparin Dosing Weight: 95kg ? ?Vital Signs: ?Temp: 98.4 ?F (36.9 ?C) (05/15 2203) ?Temp Source: Oral (05/15 2203) ?BP: 136/54 (05/15 2203) ?Pulse Rate: 68 (05/16 0034) ? ?Labs: ?Recent Labs  ?  08/06/21 ?0500 08/06/21 ?9702 08/06/21 ?1510 08/06/21 ?1734 08/06/21 ?2043 08/07/21 ?0429 08/08/21 ?6378 08/09/21 ?0108 08/09/21 ?0109  ?HGB 11.3*  --   --   --   --  11.1* 11.4*  --  10.7*  ?HCT 34.4*  --   --   --   --  33.5* 33.8*  --  31.8*  ?PLT 198  --   --   --   --  193 195  --  191  ?APTT 51*  --   --   --   --   --   --   --   --   ?LABPROT  --  13.8  --   --   --   --   --   --   --   ?INR  --  1.1  --   --   --   --   --   --   --   ?HEPARINUNFRC 0.26*  --  0.37  --    < > 0.31 0.24* 0.12*  --   ?CREATININE 1.37*  --   --   --   --   --  1.50*  --   --   ?TROPONINIHS  --  272* 200* 184*  --   --   --   --   --   ? < > = values in this interval not displayed.  ? ? ?Estimated Creatinine Clearance: 62.9 mL/min (A) (by C-G formula based on SCr of 1.5 mg/dL (H)). ? ? ?Medical History: ?Past Medical History:  ?Diagnosis Date  ? Anemia   ? iron treatments  ? Anxiety   ? Aortic atherosclerosis ([place])   ? Arthritis   ? 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 Cataract   ? Complication of anesthesia   ? PONV  ? Coronary artery disease   ? Dizziness of unknown etiology   ? has led to seizures and passing out.  ? Family history of adverse reaction to anesthesia   ? PONV mother  ? GERD (gastroesophageal reflux disease)   ? History of complete heart block   ? PPM placed  ? Hyperlipidemia   ?  Hypertension   ? LBBB (left bundle branch block)   ? Lymphedema of left leg   ? uses thigh high compression stockings  ? Melanoma ([place]) 2012  ? skin cancer, left thigh  ? OSA on CPAP   ? PONV (postoperative nausea and vomiting) 04/16/2019  ? Port-A-Cath in place   ? RIGHT chest wall  ? Presence of cardiac pacemaker   ? Medtronic  ? Seizures ([place])   ? still has episodes of dizziness. last event 1 month ago (march 2022) and will pass out. takes clonazepam  ? ? ?Medications:  ?Medications Prior to Admission  ?Medication Sig Dispense Refill Last Dose  ?  allopurinol (ZYLOPRIM) 300 MG tablet Take 300 mg by mouth daily.     ? aspirin 81 MG EC tablet Take 81 mg by mouth daily.     ? atorvastatin (LIPITOR) 20 MG tablet Take 20 mg by mouth daily.     ? clonazePAM (KLONOPIN) 0.5 MG tablet 1 TABLET IN THE MORNING, 2 IN THE EVENING (Patient taking differently: Take 0.5-1 mg by mouth 2 (two) times daily. 1 tablet in the morning, 2 in the evening) 90 tablet 0   ? heparin 25000 UT/250ML infusion Inject 1,700 Units/hr into the vein continuous.     ? hydrocortisone 2.5 % ointment Apply topically 2 (two) times daily.     ? ketoconazole (NIZORAL) 2 % cream Apply 1 application topically daily.     ? Lacosamide (VIMPAT) 100 MG TABS Take 1 tablet (100 mg total) by mouth in the morning and at bedtime. 60 tablet 3   ? lidocaine-prilocaine (EMLA) cream Apply 1 application. topically as needed. Apply small amount of cream to port site approx 1-2 hours prior to appointment. 30 g 11   ? lisinopril (ZESTRIL) 20 MG tablet Take 20 mg by mouth daily.     ? LORazepam (ATIVAN) 2 MG tablet Take 1 tablet (2 mg total) by mouth every 8 (eight) hours as needed for seizure. 20 tablet 0   ? metoprolol succinate (TOPROL-XL) 25 MG 24 hr tablet Take 25 mg by mouth daily.      ? Multiple Vitamin (MULTIVITAMIN WITH MINERALS) TABS tablet Take 1 tablet by mouth daily. Centrum Silver     ? omeprazole (PRILOSEC) 20 MG capsule Take 1 capsule (20 mg total) by mouth  daily. 90 capsule 0   ? ondansetron (ZOFRAN) 4 MG tablet TAKE 1 TABLET BY MOUTH EVERY 8 HOURS AS NEEDED FOR NAUSEA AND VOMITING (Patient taking differently: Take 4 mg by mouth every 8 (eight) hours as needed for vomiting or nausea.) 90 tablet 1   ? ?Scheduled:  ? atorvastatin  20 mg Oral Daily  ? Chlorhexidine Gluconate Cloth  6 each Topical Q0600  ? clonazePAM  0.5 mg Oral Daily  ? clonazePAM  1 mg Oral QHS  ? lacosamide  100 mg Oral BID  ? metoprolol succinate  25 mg Oral Daily  ? pantoprazole  40 mg Oral Daily  ? sodium chloride flush  10-40 mL Intracatheter Q12H  ? ?Infusions:  ? sodium chloride 100 mL/hr at 08/09/21 0101  ? heparin 1,900 Units/hr (08/09/21 0144)  ? ? ?Assessment: ?61yo male transferred to [facility] from [facility] post-cath for surgical consult, to continue heparin started at [facility]; pt had been at the low end of therapeutic prior to cath, has been running at prior rate after cath and heparin level is low. ? ?Goal of Therapy:  ?Heparin level 0.3-0.7 units/ml ?Monitor platelets by anticoagulation protocol: Yes ?  ?Plan:  ?Will increase heparin infusion by 2 units/kg/hr to 1900 units/hr (no bolus given proximity to cath) and check heparin level in 6hr. ? ?Wynona Neat, PharmD, BCPS  ?08/09/2021,1:42 AM ? ? ?

## 2021-08-09 NOTE — Progress Notes (Signed)
CARDIAC REHAB PHASE I  ? ?Preop education completed with pt. Pt educated on importance of IS use, ambulation, and sternal precautions postop. Pt given in-the-tube sheet along with [facility] care guide and cardiac surgery booklet. Pt denies further questions or concerns at this time. Will continue to follow pt throughout his hospital stay. ? ?[id number] ?Rufina Falco, RN BSN ?08/09/2021 ?2:10 PM ? ?

## 2021-08-09 NOTE — Progress Notes (Incomplete)
ANTICOAGULATION CONSULT NOTE - Initial Consult ? ?Pharmacy Consult for heparin ?Indication:  CAD awaiting CABG consult ? ?Allergies  ?Allergen Reactions  ? Ibuprofen Other (See Comments)  ?  Affects kidneys  ? Levetiracetam   ?  Other reaction(s): Dizziness, Headache  ? Nsaids   ?  Affects kidneys  ? ? ?Patient Measurements: ?Height: '5\' 8"'$  (172.7 cm) ?Weight: 112 kg (247 lb) ?IBW/kg (Calculated) : 68.4 ?Heparin Dosing Weight: 95kg ? ?Vital Signs: ?Temp: 98.4 ?F (36.9 ?C) (05/16 0502) ?Temp Source: Oral (05/16 0502) ?BP: 132/62 (05/16 0502) ?Pulse Rate: 60 (05/16 0502) ? ?Labs: ?Recent Labs  ?  08/06/21 ?1510 08/06/21 ?1734 08/06/21 ?2043 08/07/21 ?0429 08/08/21 ?3710 08/09/21 ?0108 08/09/21 ?0109  ?HGB  --   --    < > 11.1* 11.4*  --  10.7*  ?HCT  --   --   --  33.5* 33.8*  --  31.8*  ?PLT  --   --   --  193 195  --  191  ?HEPARINUNFRC 0.37  --    < > 0.31 0.24* 0.12*  --   ?CREATININE  --   --   --   --  1.50*  --  1.57*  ?TROPONINIHS 200* 184*  --   --   --   --   --   ? < > = values in this interval not displayed.  ? ? ? ?Estimated Creatinine Clearance: 60 mL/min (A) (by C-G formula based on SCr of 1.57 mg/dL (H)). ? ? ?Medical History: ?Past Medical History:  ?Diagnosis Date  ? Anemia   ? iron treatments  ? Anxiety   ? Aortic atherosclerosis ([place])   ? Arthritis   ? 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 Cataract   ? Complication of anesthesia   ? PONV  ? Coronary artery disease   ? Dizziness of unknown etiology   ? has led to seizures and passing out.  ? Family history of adverse reaction to anesthesia   ? PONV mother  ? GERD (gastroesophageal reflux disease)   ? History of complete heart block   ? PPM placed  ? Hyperlipidemia   ? Hypertension   ? LBBB (left bundle branch block)   ? Lymphedema of left leg   ? uses thigh high compression stockings  ? Melanoma ([place]) 2012  ? skin cancer, left thigh  ? OSA on CPAP   ? PONV (postoperative nausea and vomiting) 04/16/2019  ? Port-A-Cath in  place   ? RIGHT chest wall  ? Presence of cardiac pacemaker   ? Medtronic  ? Seizures ([place])   ? still has episodes of dizziness. last event 1 month ago (march 2022) and will pass out. takes clonazepam  ? ? ?Medications:  ?Medications Prior to Admission  ?Medication Sig Dispense Refill Last Dose  ? allopurinol (ZYLOPRIM) 300 MG tablet Take 300 mg by mouth daily.     ? aspirin 81 MG EC tablet Take 81 mg by mouth daily.     ? atorvastatin (LIPITOR) 20 MG tablet Take 20 mg by mouth daily.     ? clonazePAM (KLONOPIN) 0.5 MG tablet 1 TABLET IN THE MORNING, 2 IN THE EVENING (Patient taking differently: Take 0.5-1 mg by mouth 2 (two) times daily. 1 tablet in the morning, 2 in the evening) 90 tablet 0   ? heparin 25000 UT/250ML infusion Inject 1,700 Units/hr into the vein continuous.     ? hydrocortisone 2.5 % ointment  Apply topically 2 (two) times daily.     ? ketoconazole (NIZORAL) 2 % cream Apply 1 application topically daily.     ? Lacosamide (VIMPAT) 100 MG TABS Take 1 tablet (100 mg total) by mouth in the morning and at bedtime. 60 tablet 3   ? lidocaine-prilocaine (EMLA) cream Apply 1 application. topically as needed. Apply small amount of cream to port site approx 1-2 hours prior to appointment. 30 g 11   ? lisinopril (ZESTRIL) 20 MG tablet Take 20 mg by mouth daily.     ? LORazepam (ATIVAN) 2 MG tablet Take 1 tablet (2 mg total) by mouth every 8 (eight) hours as needed for seizure. 20 tablet 0   ? metoprolol succinate (TOPROL-XL) 25 MG 24 hr tablet Take 25 mg by mouth daily.      ? Multiple Vitamin (MULTIVITAMIN WITH MINERALS) TABS tablet Take 1 tablet by mouth daily. Centrum Silver     ? omeprazole (PRILOSEC) 20 MG capsule Take 1 capsule (20 mg total) by mouth daily. 90 capsule 0   ? ondansetron (ZOFRAN) 4 MG tablet TAKE 1 TABLET BY MOUTH EVERY 8 HOURS AS NEEDED FOR NAUSEA AND VOMITING (Patient taking differently: Take 4 mg by mouth every 8 (eight) hours as needed for vomiting or nausea.) 90 tablet 1    ? ?Scheduled:  ? aspirin EC  81 mg Oral Daily  ? atorvastatin  20 mg Oral Daily  ? bisacodyl  5 mg Oral Once  ? chlorhexidine  60 mL Topical Once  ? And  ? [START ON 08/10/2021] chlorhexidine  60 mL Topical Once  ? [START ON 08/10/2021] chlorhexidine  15 mL Mouth/Throat Once  ? Chlorhexidine Gluconate Cloth  6 each Topical Q0600  ? clonazePAM  0.5 mg Oral Daily  ? clonazePAM  1 mg Oral QHS  ? [START ON 08/10/2021] diazepam  2 mg Oral Once  ? lacosamide  100 mg Oral BID  ? metoprolol succinate  25 mg Oral Daily  ? [START ON 08/10/2021] metoprolol tartrate  12.5 mg Oral Once  ? pantoprazole  40 mg Oral Daily  ? sodium chloride flush  10-40 mL Intracatheter Q12H  ? ?Infusions:  ? sodium chloride Stopped (08/09/21 0400)  ? heparin 1,900 Units/hr (08/09/21 0144)  ? magnesium sulfate bolus IVPB    ? ? ?Assessment: ?61yo male transferred to [facility] from [facility] post-cath 05/15 for surgical consult, to continue heparin started at [facility]. ? ?Heparin level *** ***therapeutic after rate increase to 1900 units/h - no bolus given proximity to cath (5000 units given 13:41). Hgb down 11to 10.7, pltc stable. No bleeding reported. ? ?Goal of Therapy:  ?Heparin level 0.3-0.7 units/ml ?Monitor platelets by anticoagulation protocol: Yes ?  ?Plan:  ?Continue heparin gtt 1900 units/h ?Daily heparin level, CBC ?F/u plans for CABG ? ?Laurey Arrow, PharmD ?PGY1 Pharmacy Resident ?08/09/2021  9:16 AM ? ?Please check [email or web address] for unit-specific pharmacy phone numbers. ? ?

## 2021-08-09 NOTE — Progress Notes (Signed)
Pt has home unit cpap at bedside set up by pt's family.  RT checked cord, water chamber, etc.  RN stated she would place him on device when he's ready or call RT.  Will cont to monitor. ?

## 2021-08-09 NOTE — Progress Notes (Signed)
?PROGRESS NOTE ? ? ? ?Edward Pitts  ZOX:[id number] DOB: 1960-12-09 DOA: 08/08/2021 ?PCP: Mechele Claude, FNP ? ? ?Brief Narrative:  ?61 year old white male history of hypertension, hyperlipidemia, seizure disorder, history of complete heart block status post permanent pacemaker who was admitted over the weekend at [facility] due to NSTEMI.  Patient had a left heart cath which showed obstructive left main disease with an 80% stenosis.  Patient transferred to [facility] for CABG. ? ?Assessment & Plan: ?  ?Principal Problem: ?  NSTEMI (non-ST elevated myocardial infarction) ([place]) ?Active Problems: ?  HTN (hypertension) ?  HLD (hyperlipidemia) ?  Seizures ([place]) ?  Complete heart block ([place]) ?  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  Coronary artery disease involving native coronary artery of native heart ?  Stage 3a chronic kidney disease ([place]) ?  Port-A-Cath in place ?  S/P placement of cardiac pacemaker ? ?NSTEMI/CAD: Patient chest pain-free currently.  CT surgery has seen him and he is likely going to have CABG tomorrow morning.  Management per cardiothoracic surgery.  On aspirin. ? ?Port-A-Cath in place: Stable. ? ?S/p cardiac pacemaker: For CHB.  Stable. ? ?CKD stage IIIa: Stable. ? 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 ? ?Seizure: On Vimpat and Klonopin twice daily.  Stable. ? ?Essential hypertension: Controlled on current medication which is Toprol-XL. ? ?Hyperlipidemia: Continue Lipitor. ? ?DVT prophylaxis: SCDs Start: 08/08/21 2317 ?  Code Status: Full Code  ?Family Communication:  None present at bedside.  Plan of care discussed with patient in length and he/she verbalized understanding and agreed with it. ? ?Status is: Inpatient ?Remains inpatient appropriate because: Scheduled for CABG tomorrow. ? ? ?Estimated body mass index is 37.56 kg/m? as calculated from the following: ?  Height as of this encounter: '5\' 8"'$  (1.727 m). ?  Weight as  of this encounter: 112 kg. ? ?  ?Nutritional Assessment: ?Body mass index is 37.56 kg/m?Marland KitchenMarland Kitchen ?Seen by dietician.  I agree with the assessment and plan as outlined below: ?Nutrition Status: ?  ?  ?  ? ?. ?Skin Assessment: ?I have examined the patient's skin and I agree with the wound assessment as performed by the wound care RN as outlined below: ?  ? ?Consultants:  ?CT surgery ? ?Procedures:  ?None ? ?Antimicrobials:  ?Anti-infectives (From admission, onward)  ? ? None  ? ?  ?  ? ? ?Subjective: ?Seen and examined.  No chest pain or any other complaint. ? ?Objective: ?Vitals:  ? 08/08/21 2156 08/08/21 2203 08/09/21 0034 08/09/21 0502  ?BP:  (!) 136/54  132/62  ?Pulse:  61 68 60  ?Resp:  '18 17 20  '$ ?Temp:  98.4 ?F (36.9 ?C)  98.4 ?F (36.9 ?C)  ?TempSrc:  Oral  Oral  ?SpO2:  94% 95% 93%  ?Weight: 112.3 kg   112 kg  ?Height: '5\' 8"'$  (1.727 m)     ? ? ?Intake/Output Summary (Last 24 hours) at 08/09/2021 1320 ?Last data filed at 08/09/2021 1257 ?Gross per 24 hour  ?Intake --  ?Output 875 ml  ?Net -875 ml  ? ?Filed Weights  ? 08/08/21 2156 08/09/21 0502  ?Weight: 112.3 kg 112 kg  ? ? ?Examination: ? ?General exam: Appears calm and comfortable  ?Respiratory system: Clear to auscultation. Respiratory effort normal. ?Cardiovascular system: S1 & S2 heard, RRR. No JVD, murmurs, rubs, gallops or clicks. No pedal edema. ?Gastrointestinal system: Abdomen is nondistended, soft and nontender. No organomegaly or masses felt.  Normal bowel sounds heard. ?Central nervous system: Alert and oriented. No focal neurological deficits. ?Extremities: Symmetric 5 x 5 power. ?Skin: No rashes, lesions or ulcers ?Psychiatry: Judgement and insight appear normal. Mood & affect appropriate.  ? ? ?Data Reviewed: I have personally reviewed following labs and imaging studies ? ?CBC: ?Recent Labs  ?Lab 08/05/21 ?1132 08/06/21 ?0500 08/07/21 ?0429 08/08/21 ?1443 08/09/21 ?0109  ?WBC 5.1 5.9 5.0 5.7 5.3  ?NEUTROABS  --   --   --   --  3.3  ?HGB 11.0* 11.3* 11.1*  11.4* 10.7*  ?HCT 33.1* 34.4* 33.5* 33.8* 31.8*  ?MCV 89.7 90.3 89.1 88.3 90.6  ?PLT 208 198 193 195 191  ? ?Basic Metabolic Panel: ?Recent Labs  ?Lab 08/05/21 ?1132 08/06/21 ?0500 08/08/21 ?1540 08/09/21 ?0109  ?NA 141 142 142 139  ?K 4.1 4.2 4.1 3.8  ?CL 112* 114* 107 110  ?CO2 '24 24 26 24  '$ ?GLUCOSE 118* 116* 108* 108*  ?BUN 31* 28* 27* 21  ?CREATININE 1.57* 1.37* 1.50* 1.57*  ?CALCIUM 9.0 9.5 9.6 8.7*  ?MG  --   --   --  1.6*  ? ?GFR: ?Estimated Creatinine Clearance: 60 mL/min (A) (by C-G formula based on SCr of 1.57 mg/dL (H)). ?Liver Function Tests: ?Recent Labs  ?Lab 08/05/21 ?1132 08/09/21 ?0109 08/09/21 ?1046  ?AST 35 40 40  ?ALT 37 56* 61*  ?ALKPHOS 93 86 91  ?BILITOT 0.9 0.6 0.5  ?PROT 7.3 6.4* 6.6  ?ALBUMIN 4.1 3.5 3.6  ? ?No results for input(s): LIPASE, AMYLASE in the last 168 hours. ?No results for input(s): AMMONIA in the last 168 hours. ?Coagulation Profile: ?Recent Labs  ?Lab 08/06/21 ?0867  ?INR 1.1  ? ?Cardiac Enzymes: ?No results for input(s): CKTOTAL, CKMB, CKMBINDEX, TROPONINI in the last 168 hours. ?BNP (last 3 results) ?No results for input(s): PROBNP in the last 8760 hours. ?HbA1C: ?Recent Labs  ?  08/09/21 ?0108  ?HGBA1C 5.7*  ? ?CBG: ?No results for input(s): GLUCAP in the last 168 hours. ?Lipid Profile: ?No results for input(s): CHOL, HDL, LDLCALC, TRIG, CHOLHDL, LDLDIRECT in the last 72 hours. ?Thyroid Function Tests: ?Recent Labs  ?  08/09/21 ?1046  ?TSH 3.342  ? ?Anemia Panel: ?No results for input(s): VITAMINB12, FOLATE, FERRITIN, TIBC, IRON, RETICCTPCT in the last 72 hours. ?Sepsis Labs: ?No results for input(s): PROCALCITON, LATICACIDVEN in the last 168 hours. ? ?No results found for this or any previous visit (from the past 240 hour(s)).  ? ?Radiology Studies: ?CARDIAC CATHETERIZATION ? ?Result Date: 08/08/2021 ?  Mid RCA lesion is 45% stenosed.   Mid LM lesion is 80% stenosed.   The left ventricular systolic function is normal.   LV end diastolic pressure is normal.   The left  ventricular ejection fraction is greater than 65% by visual estimate. 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 Normal LV systolic function with mild septal hypokinesis due to pacemaker with ejection fraction of 50% Significant progression of distal left main coronary atherosclerosis to 75 to 80% and proximal right coronary artery to 40% Plan Proceed to coronary artery bypass grafting due to significant progression of left main coronary artery disease, non-ST elevation myocardial infarction, and abnormal stress test 3/23 High intensity cholesterol therapy Cardiac rehabilitation Further treatment options after above  ? ?ECHOCARDIOGRAM COMPLETE ? ?Result Date: 08/08/2021 ?   ECHOCARDIOGRAM REPORT   Patient Name:   Edward Pitts Date of  Exam: 08/08/2021 Medical Rec #:  [id number]    Height:       67.0 in Accession #:    [id number]   Weight:       250.0 lb Date of Birth:  30-Oct-1960    BSA:          2.223 m? Patient Age:    61 years     BP:           138/66 mmHg Patient Gender: M            HR:           68 bpm. Exam Location:  [facility] Procedure: 2D Echo, Cardiac Doppler and Color Doppler Indications:     NSTEMI I21.4  History:         Patient has prior history of Echocardiogram examinations, most                  recent 08/26/2018. CAD, Pacemaker, Arrythmias:LBBB; Risk                  Factors:Hypertension.  Sonographer:     Sherrie Sport Referring Phys:  [id number] AGBATA Diagnosing Phys: Yolonda Kida MD  Sonographer Comments: Suboptimal apical window and no subcostal window. IMPRESSIONS  1. Left ventricular ejection fraction, by estimation, is 65 to 70%. The left ventricle has normal function. The left ventricle has no regional wall motion abnormalities. Left ventricular diastolic parameters are consistent with Grade I diastolic dysfunction  (impaired relaxation).  2. Right ventricular systolic function is normal. The right ventricular size is normal.  3. The mitral valve is normal in structure. Trivial mitral valve regurgitation.  4. The aortic

## 2021-08-09 NOTE — Consult Note (Addendum)
? ?   ?[address] ?      [place] [address] ?            [phone]       ? ?Laurell Josephs ?[place] Record #[id number] ?Date of Birth: 07-14-1960 ? ?Referring:  [name], MD ?Primary Care: Mechele Claude, FNP ?Primary Cardiologist:None ? ?Reason for Consult: ?Evaluation for coronary bypass grafting in a 61 year old male presenting with acute non-STEMI and left main coronary artery stenosis. ? ? 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  [name] presented to the [facility] emergency room via EMS on 08/05/2021 after sudden onset of chest pain associated with dizziness and left arm tingling while mowing using a riding lawnmower.  Initial troponin in the emergency room was 23 and later rose to a peak of 272.  EKG showed a paced rhythm with no evidence of acute ischemia.  Chest x-ray was unrevealing.  He was admitted to [facility] hospital by the hospitalist service with acute non-ST elevation myocardial infarction.  He was started on a heparin drip.  His chest pain subsided by the time of his admission.  Dr. Nehemiah Massed of the cardiology service was consulted.  He recommended echocardiography and left heart catheterization.  The echocardiogram showed left ventricular ejection fraction of 65 to 70%, normal RV function, and trivial MR.  The study was otherwise unremarkable.  Left heart catheterization was performed yesterday showing an 80% left main coronary artery stenosis and  a 45% lesion in the mid RCA.  [name] was transferred to [facility] for further evaluation and potential surgical revascularization for his high-grade left main coronary artery stenosis. ? ?Currently, [name] is resting comfortably in bed with a heparin infusion running.  Denies any chest pain since admission.  He is retired from the [employer] and lives on [employer] and disability.  He lives alone but has a brother in [place] that he said may be able to come and stay with him for a period of time after discharge.   ? ?He is right-hand dominant.  He has chronic edema in his left lower extremity following extensive surgical resection at the left groin level followed by radiation to the site. ? ? ? ?Current Activity/ Functional Status: ?Patient is independent with mobility/ambulation, transfers, ADL's, IADL's. ?  ?Zubrod Score: ?At the time of surgery this patient?s most appropriate activity status/level should be described as: ?'[]'$     0    Normal activity, no symptoms ?'[x]'$     1    Restricted in physical strenuous activity but ambulatory, able to do out light work ?'[]'$     2    Ambulatory and capable of self care, unable to do work activities, up and about                 more than 50%  Of the time                            ?'[]'$   3    Only limited self care, in bed greater than 50% of waking hours ?'[]'$     4    Completely disabled, no self care, confined to bed or chair ?'[]'$     5    Moribund ? ?Past Medical History:  ?Diagnosis Date  ? Anemia   ? iron treatments  ? Anxiety   ? Aortic atherosclerosis ([place])   ? Arthritis   ? Cancer of groin ([place]) 2021  ? left groin, resected, radiation  ? Cataract   ? Complication of anesthesia   ? PONV  ? Coronary artery disease   ? Dizziness of unknown etiology   ? has led to seizures and passing out.  ? Family history of adverse reaction to anesthesia   ? PONV mother  ? GERD (gastroesophageal reflux disease)   ? History of complete heart block   ? PPM placed  ?  Hyperlipidemia   ? Hypertension   ? LBBB (left bundle branch block)   ? Lymphedema of left leg   ? uses thigh high compression stockings  ? Melanoma ([place]) 2012  ? skin cancer, left thigh  ? OSA on CPAP   ? PONV (postoperative nausea and vomiting) 04/16/2019  ? Port-A-Cath in place   ? RIGHT chest wall  ? Presence of cardiac pacemaker   ? Medtronic  ? Seizures ([place])   ? still has episodes of dizziness. last event 1 month ago (march 2022) and will pass out. takes clonazepam  ? ? ?Past Surgical History:  ?Procedure Laterality Date  ? CT RADIATION THERAPY GUIDE    ? left groin  ? dental implant    ? permanent implant  ? KNEE SURGERY Left   ? arthroscopy  ? LEFT HEART CATH AND CORONARY ANGIOGRAPHY Left 06/29/2017  ? Procedure: LEFT HEART CATH AND CORONARY ANGIOGRAPHY;  Surgeon: [name], MD;  Location: [place] [facility];  Service: Cardiovascular;  Laterality: Left;  ? LEFT HEART CATH AND CORONARY ANGIOGRAPHY N/A 08/08/2021  ? Procedure: LEFT HEART CATH AND CORONARY ANGIOGRAPHY;  Surgeon: [name], MD;  Location: [place] [facility];  Service: Cardiovascular;  Laterality: N/A;  ? LYMPH NODE DISSECTION Left 04/16/2019  ? Procedure: Left inguinal Lymph Node Dissection;  Surgeon: [name], MD;  Location: [place];  Service: General;  Laterality: Left;  ? MELANOMA EXCISION Left 04/16/2019  ? Procedure: MELANOMA EXCISION LEFT GROIN MASS;  Surgeon: [name], MD;  Location: [place];  Service: General;  Laterality: Left;  ? MELANOMA EXCISION WITH SENTINEL LYMPH NODE BIOPSY Left 2012  ? Left calf   ? PACEMAKER INSERTION N/A 08/26/2018  ? Procedure: INSERTION PACEMAKER;  Surgeon: [name], MD;  Location: [facility];  Service: Cardiovascular;  Laterality: N/A;  ? PORTA CATH INSERTION N/A 08/26/2019  ? Procedure: PORTA CATH INSERTION;  Surgeon: Katha Cabal, MD;  Location: [place] [facility];  Service: Cardiovascular;  Laterality: N/A;  ? SUPERFICIAL LYMPH NODE BIOPSY / EXCISION Left 2020  ? lymph  nodes removed around left groin melanoma site  ? TEMPORARY PACEMAKER N/A 08/25/2018  ? Procedure: TEMPORARY PACEMAKER;  Surgeon: [name], MD;  Location: [place] [facility];  Service: Cardiovascular;  Laterality: N/A;  ? TOTAL HIP ARTHROPLASTY Left 07/14/2020  ? Procedure: TOTAL HIP ARTHROPLASTY;  Surgeon: Dereck Leep, MD;  Location: [facility];  Service: Orthopedics;  Laterality: Left;  ? ? ?Social History  ? ?Tobacco Use  ?Smoking Status Never  ?Smokeless Tobacco Never  ?  ?  Social History  ? ?Substance and Sexual Activity  ?Alcohol Use No  ? ? ? ?Allergies  ?Allergen Reactions  ? Ibuprofen Other (See Comments)  ?  Affects kidneys  ? Levetiracetam   ?  Other reaction(s): Dizziness, Headache  ? Nsaids   ?  Affects kidneys  ? ? ?Current Facility-Administered Medications  ?Medication Dose Route Frequency Provider Last Rate Last Admin  ? 0.9 %  sodium chloride infusion   Intravenous Continuous Kristopher Oppenheim, DO   Stopped at 08/09/21 0400  ? acetaminophen (TYLENOL) tablet 650 mg  650 mg Oral Q6H PRN Kristopher Oppenheim, DO      ? Or  ? acetaminophen (TYLENOL) suppository 650 mg  650 mg Rectal Q6H PRN Kristopher Oppenheim, DO      ? ALPRAZolam Duanne Moron) tablet 0.25-0.5 mg  0.25-0.5 mg Oral Q4H PRN Dahlia Byes, MD      ? aspirin EC tablet 81 mg  81 mg Oral Daily Kristopher Oppenheim, DO      ? atorvastatin (LIPITOR) tablet 20 mg  20 mg Oral Daily Kristopher Oppenheim, DO      ? bisacodyl (DULCOLAX) EC tablet 5 mg  5 mg Oral Once Dahlia Byes, MD      ? chlorhexidine (HIBICLENS) 4 % liquid 4 application.  60 mL Topical Once Dahlia Byes, MD      ? And  ? [START ON 08/10/2021] chlorhexidine (HIBICLENS) 4 % liquid 4 application.  60 mL Topical Once Dahlia Byes, MD      ? Derrill Memo ON 08/10/2021] chlorhexidine (PERIDEX) 0.12 % solution 15 mL  15 mL Mouth/Throat Once Dahlia Byes, MD      ? Chlorhexidine Gluconate Cloth 2 % PADS 6 each  6 each Topical Q0600 Dahlia Byes, MD   6 each at 08/09/21 0559  ? clonazePAM (KLONOPIN) tablet 0.5 mg  0.5 mg  Oral Daily Kristopher Oppenheim, DO      ? clonazePAM Bobbye Charleston) tablet 1 mg  1 mg Oral QHS Kristopher Oppenheim, DO   1 mg at 08/09/21 0040  ? [START ON 08/10/2021] diazepam (VALIUM) tablet 2 mg  2 mg Oral Once Dahlia Byes,

## 2021-08-09 NOTE — Progress Notes (Addendum)
ANTICOAGULATION CONSULT NOTE  ? ?Pharmacy Consult for heparin ?Indication:  CAD awaiting CABG consult ? ?Allergies  ?Allergen Reactions  ? Ibuprofen Other (See Comments)  ?  Affects kidneys  ? Levetiracetam   ?  Other reaction(s): Dizziness, Headache  ? Nsaids   ?  Affects kidneys  ? ? ?Patient Measurements: ?Height: '5\' 8"'$  (172.7 cm) ?Weight: 112 kg (247 lb) ?IBW/kg (Calculated) : 68.4 ?Heparin Dosing Weight: 95kg ? ?Vital Signs: ?Temp: 98.4 ?F (36.9 ?C) (05/16 1439) ?Temp Source: Oral (05/16 0502) ?BP: 132/62 (05/16 0502) ?Pulse Rate: 60 (05/16 0502) ? ?Labs: ?Recent Labs  ?  08/06/21 ?1510 08/06/21 ?1734 08/06/21 ?2043 08/07/21 ?0429 08/08/21 ?3500 08/09/21 ?0108 08/09/21 ?0109 08/09/21 ?1335  ?HGB  --   --    < > 11.1* 11.4*  --  10.7*  --   ?HCT  --   --   --  33.5* 33.8*  --  31.8*  --   ?PLT  --   --   --  193 195  --  191  --   ?HEPARINUNFRC 0.37  --    < > 0.31 0.24* 0.12*  --  0.39  ?CREATININE  --   --   --   --  1.50*  --  1.57*  --   ?TROPONINIHS 200* 184*  --   --   --   --   --   --   ? < > = values in this interval not displayed.  ? ? ? ?Estimated Creatinine Clearance: 60 mL/min (A) (by C-G formula based on SCr of 1.57 mg/dL (H)). ? ? ?Medical History: ?Past Medical History:  ?Diagnosis Date  ? Anemia   ? iron treatments  ? Anxiety   ? Aortic atherosclerosis ([place])   ? Arthritis   ? Cancer of groin ([place]) 2021  ? left groin, resected, radiation  ? Cataract   ? Complication of anesthesia   ? PONV  ? Coronary artery disease   ? Dizziness of unknown etiology   ? has led to seizures and passing out.  ? Family history of adverse reaction to anesthesia   ? PONV mother  ? GERD (gastroesophageal reflux disease)   ? History of complete heart block   ? PPM placed  ? Hyperlipidemia   ? Hypertension   ? LBBB (left bundle branch block)   ? Lymphedema of left leg   ? uses thigh high compression stockings  ? Melanoma ([place]) 2012  ? skin cancer, left thigh  ? OSA on CPAP   ? PONV (postoperative nausea and vomiting)  04/16/2019  ? Port-A-Cath in place   ? RIGHT chest wall  ? Presence of cardiac pacemaker   ? Medtronic  ? Seizures ([place])   ? still has episodes of dizziness. last event 1 month ago (march 2022) and will pass out. takes clonazepam  ? ? ?Medications:  ?Medications Prior to Admission  ?Medication Sig Dispense Refill Last Dose  ? allopurinol (ZYLOPRIM) 300 MG tablet Take 300 mg by mouth daily.   08/05/2021  ? aspirin 81 MG EC tablet Take 81 mg by mouth daily.   08/05/2021  ? atorvastatin (LIPITOR) 20 MG tablet Take 20 mg by mouth daily.   08/05/2021  ? Carboxymeth-Glyc-Polysorb PF (REFRESH OPTIVE MEGA-3) 0.5-1-0.5 % SOLN Place 1 drop into both eyes daily as needed (dry eyes).   08/05/2021  ? clonazePAM (KLONOPIN) 0.5 MG tablet 1 TABLET IN THE MORNING, 2 IN THE EVENING (Patient taking differently: Take 0.5-1 mg by  mouth 2 (two) times daily. 1 tablet in the morning, 2 in the evening) 90 tablet 0 08/05/2021  ? hydrocortisone 2.5 % ointment Apply 1 application. topically daily as needed (itching).   Past Week  ? Lacosamide (VIMPAT) 100 MG TABS Take 1 tablet (100 mg total) by mouth in the morning and at bedtime. 60 tablet 3 08/05/2021  ? lidocaine-prilocaine (EMLA) cream Apply 1 application. topically as needed. Apply small amount of cream to port site approx 1-2 hours prior to appointment. 30 g 11   ? lisinopril (ZESTRIL) 20 MG tablet Take 20 mg by mouth daily.   08/05/2021  ? LORazepam (ATIVAN) 2 MG tablet Take 1 tablet (2 mg total) by mouth every 8 (eight) hours as needed for seizure. 20 tablet 0 unk  ? metoprolol succinate (TOPROL-XL) 25 MG 24 hr tablet Take 25 mg by mouth daily.    08/05/2021 at 0800  ? Multiple Vitamin (MULTIVITAMIN WITH MINERALS) TABS tablet Take 1 tablet by mouth daily. Centrum Silver   08/05/2021  ? omeprazole (PRILOSEC) 20 MG capsule Take 1 capsule (20 mg total) by mouth daily. 90 capsule 0 08/05/2021  ? ondansetron (ZOFRAN) 4 MG tablet TAKE 1 TABLET BY MOUTH EVERY 8 HOURS AS NEEDED FOR NAUSEA AND VOMITING  (Patient taking differently: Take 4 mg by mouth every 8 (eight) hours as needed for vomiting or nausea.) 90 tablet 1 unk  ? heparin 25000 UT/250ML infusion Inject 1,700 Units/hr into the vein continuous.     ? ?Scheduled:  ? aspirin EC  81 mg Oral Daily  ? atorvastatin  20 mg Oral Daily  ? bisacodyl  5 mg Oral Once  ? chlorhexidine  60 mL Topical Once  ? And  ? [START ON 08/10/2021] chlorhexidine  60 mL Topical Once  ? [START ON 08/10/2021] chlorhexidine  15 mL Mouth/Throat Once  ? Chlorhexidine Gluconate Cloth  6 each Topical Q0600  ? clonazePAM  0.5 mg Oral Daily  ? clonazePAM  1 mg Oral QHS  ? [START ON 08/10/2021] diazepam  2 mg Oral Once  ? [START ON 08/10/2021] epinephrine  0-10 mcg/min Intravenous To OR  ? [START ON 08/10/2021] heparin-papaverine-plasmalyte irrigation   Irrigation To OR  ? [START ON 08/10/2021] insulin   Intravenous To OR  ? lacosamide  100 mg Oral BID  ? [START ON 08/10/2021] magnesium sulfate  40 mEq Other To OR  ? metoprolol succinate  25 mg Oral Daily  ? [START ON 08/10/2021] metoprolol tartrate  12.5 mg Oral Once  ? pantoprazole  40 mg Oral Daily  ? [START ON 08/10/2021] phenylephrine  30-200 mcg/min Intravenous To OR  ? [START ON 08/10/2021] potassium chloride  80 mEq Other To OR  ? sodium chloride flush  10-40 mL Intracatheter Q12H  ? [START ON 08/10/2021] tranexamic acid  15 mg/kg Intravenous To OR  ? [START ON 08/10/2021] tranexamic acid  2 mg/kg Intracatheter To OR  ? ?Infusions:  ? [START ON 08/10/2021]  ceFAZolin (ANCEF) IV    ? [START ON 08/10/2021]  ceFAZolin (ANCEF) IV    ? [START ON 08/10/2021] dexmedetomidine    ? [START ON 08/10/2021] heparin 30,000 units/NS 1000 mL solution for CELLSAVER    ? heparin 1,900 Units/hr (08/09/21 1434)  ? [START ON 08/10/2021] milrinone    ? [START ON 08/10/2021] nitroGLYCERIN    ? [START ON 08/10/2021] norepinephrine    ? [START ON 08/10/2021] tranexamic acid (CYKLOKAPRON) infusion ([facility])    ? [START ON 08/10/2021] vancomycin    ? ? ?  Assessment: ?61yo male  transferred to [facility] from [facility] post-cath 05/15 for surgical consult, to continue heparin started at [facility]. ? ?Heparin level 0.39 (therapeutic) with heparin gtt running at 1900 units/h.. Hgb down 11 to 10.7, pltc stable. No bleeding or issues with infusion noted. CABG tentatively scheduled for 5/17 by Dr. Prescott Gum.  ? ?Goal of Therapy:  ?Heparin level 0.3-0.7 units/ml ?Monitor platelets by anticoagulation protocol: Yes ?  ?Plan:  ?Continue heparin gtt at 1900 units/h ?Daily heparin level, CBC ?F/U 6h confirmatory heparin level  ?F/u plans for CABG ? ?Adria Dill, PharmD ?PGY-1 Acute Care Resident  ?08/09/2021 3:00 PM  ? ? ?

## 2021-08-10 ENCOUNTER — Inpatient Hospital Stay (HOSPITAL_COMMUNITY)

## 2021-08-10 ENCOUNTER — Inpatient Hospital Stay (HOSPITAL_COMMUNITY)
Admission: AD | Disposition: A | Discharge status: Home Health | Payer: Self-pay | Source: Other Acute Inpatient Hospital | Attending: Cardiothoracic Surgery

## 2021-08-10 ENCOUNTER — Other Ambulatory Visit: Payer: Self-pay

## 2021-08-10 DIAGNOSIS — Z951 Presence of aortocoronary bypass graft: Secondary | ICD-10-CM

## 2021-08-10 DIAGNOSIS — G4733 Obstructive sleep apnea (adult) (pediatric): Secondary | ICD-10-CM | POA: Diagnosis not present

## 2021-08-10 DIAGNOSIS — I10 Essential (primary) hypertension: Secondary | ICD-10-CM

## 2021-08-10 DIAGNOSIS — I25118 Atherosclerotic heart disease of native coronary artery with other forms of angina pectoris: Secondary | ICD-10-CM | POA: Diagnosis present

## 2021-08-10 DIAGNOSIS — Z9989 Dependence on other enabling machines and devices: Secondary | ICD-10-CM

## 2021-08-10 DIAGNOSIS — I2510 Atherosclerotic heart disease of native coronary artery without angina pectoris: Secondary | ICD-10-CM | POA: Diagnosis not present

## 2021-08-10 HISTORY — PX: CORONARY ARTERY BYPASS GRAFT: SHX141

## 2021-08-10 HISTORY — PX: ENDOVEIN HARVEST OF GREATER SAPHENOUS VEIN: SHX5059

## 2021-08-10 HISTORY — PX: TEE WITHOUT CARDIOVERSION: SHX5443

## 2021-08-10 LAB — CBC
HCT: 31.9 % — ABNORMAL LOW (ref 39.0–52.0)
HCT: 32 % — ABNORMAL LOW (ref 39.0–52.0)
HCT: 31.4 % — ABNORMAL LOW (ref 39.0–52.0)
Hemoglobin: 10.7 g/dL — ABNORMAL LOW (ref 13.0–17.0)
Hemoglobin: 10.9 g/dL — ABNORMAL LOW (ref 13.0–17.0)
Hemoglobin: 10.8 g/dL — ABNORMAL LOW (ref 13.0–17.0)
MCH: 30.1 pg (ref 26.0–34.0)
MCH: 30.3 pg (ref 26.0–34.0)
MCH: 30.3 pg (ref 26.0–34.0)
MCHC: 33.5 g/dL (ref 30.0–36.0)
MCHC: 34.1 g/dL (ref 30.0–36.0)
MCHC: 34.4 g/dL (ref 30.0–36.0)
MCV: 89.9 fL (ref 80.0–100.0)
MCV: 88.9 fL (ref 80.0–100.0)
MCV: 88.2 fL (ref 80.0–100.0)
Platelets: 175 10*3/uL (ref 150–400)
Platelets: 128 10*3/uL — ABNORMAL LOW (ref 150–400)
Platelets: 130 10*3/uL — ABNORMAL LOW (ref 150–400)
RBC: 3.55 MIL/uL — ABNORMAL LOW (ref 4.22–5.81)
RBC: 3.6 MIL/uL — ABNORMAL LOW (ref 4.22–5.81)
RBC: 3.56 MIL/uL — ABNORMAL LOW (ref 4.22–5.81)
RDW: 13.2 % (ref 11.5–15.5)
RDW: 13.2 % (ref 11.5–15.5)
RDW: 13.3 % (ref 11.5–15.5)
WBC: 5.4 10*3/uL (ref 4.0–10.5)
WBC: 8.1 10*3/uL (ref 4.0–10.5)
WBC: 8.2 10*3/uL (ref 4.0–10.5)
nRBC: 0 % (ref 0.0–0.2)
nRBC: 0 % (ref 0.0–0.2)
nRBC: 0 % (ref 0.0–0.2)

## 2021-08-10 LAB — POCT I-STAT EG7
Acid-Base Excess: 0 mmol/L (ref 0.0–2.0)
Bicarbonate: 25.3 mmol/L (ref 20.0–28.0)
Calcium, Ion: 1.14 mmol/L — ABNORMAL LOW (ref 1.15–1.40)
HCT: 26 % — ABNORMAL LOW (ref 39.0–52.0)
Hemoglobin: 8.8 g/dL — ABNORMAL LOW (ref 13.0–17.0)
O2 Saturation: 70 %
Potassium: 4.9 mmol/L (ref 3.5–5.1)
Sodium: 140 mmol/L (ref 135–145)
TCO2: 27 mmol/L (ref 22–32)
pCO2, Ven: 46 mmHg (ref 44–60)
pH, Ven: 7.347 (ref 7.25–7.43)
pO2, Ven: 39 mmHg (ref 32–45)

## 2021-08-10 LAB — BASIC METABOLIC PANEL
Anion gap: 7 (ref 5–15)
Anion gap: 8 (ref 5–15)
BUN: 21 mg/dL (ref 8–23)
BUN: 19 mg/dL (ref 8–23)
CO2: 25 mmol/L (ref 22–32)
CO2: 21 mmol/L — ABNORMAL LOW (ref 22–32)
Calcium: 9.2 mg/dL (ref 8.9–10.3)
Calcium: 8.8 mg/dL — ABNORMAL LOW (ref 8.9–10.3)
Chloride: 107 mmol/L (ref 98–111)
Chloride: 109 mmol/L (ref 98–111)
Creatinine, Ser: 1.53 mg/dL — ABNORMAL HIGH (ref 0.61–1.24)
Creatinine, Ser: 1.6 mg/dL — ABNORMAL HIGH (ref 0.61–1.24)
GFR, Estimated: 51 mL/min — ABNORMAL LOW (ref >60)
GFR, Estimated: 49 mL/min — ABNORMAL LOW (ref >60)
Glucose, Bld: 111 mg/dL — ABNORMAL HIGH (ref 70–99)
Glucose, Bld: 191 mg/dL — ABNORMAL HIGH (ref 70–99)
Potassium: 4 mmol/L (ref 3.5–5.1)
Potassium: 4.4 mmol/L (ref 3.5–5.1)
Sodium: 139 mmol/L (ref 135–145)
Sodium: 138 mmol/L (ref 135–145)

## 2021-08-10 LAB — POCT I-STAT 7, (LYTES, BLD GAS, ICA,H+H)
Acid-Base Excess: 0 mmol/L (ref 0.0–2.0)
Acid-Base Excess: 1 mmol/L (ref 0.0–2.0)
Acid-base deficit: 2 mmol/L (ref 0.0–2.0)
Acid-base deficit: 1 mmol/L (ref 0.0–2.0)
Acid-base deficit: 2 mmol/L (ref 0.0–2.0)
Acid-base deficit: 3 mmol/L — ABNORMAL HIGH (ref 0.0–2.0)
Acid-base deficit: 3 mmol/L — ABNORMAL HIGH (ref 0.0–2.0)
Acid-base deficit: 4 mmol/L — ABNORMAL HIGH (ref 0.0–2.0)
Bicarbonate: 23.4 mmol/L (ref 20.0–28.0)
Bicarbonate: 25.1 mmol/L (ref 20.0–28.0)
Bicarbonate: 26 mmol/L (ref 20.0–28.0)
Bicarbonate: 23.9 mmol/L (ref 20.0–28.0)
Bicarbonate: 23.3 mmol/L (ref 20.0–28.0)
Bicarbonate: 24 mmol/L (ref 20.0–28.0)
Bicarbonate: 23.6 mmol/L (ref 20.0–28.0)
Bicarbonate: 21.6 mmol/L (ref 20.0–28.0)
Calcium, Ion: 1.28 mmol/L (ref 1.15–1.40)
Calcium, Ion: 1.07 mmol/L — ABNORMAL LOW (ref 1.15–1.40)
Calcium, Ion: 1.16 mmol/L (ref 1.15–1.40)
Calcium, Ion: 1.16 mmol/L (ref 1.15–1.40)
Calcium, Ion: 1.13 mmol/L — ABNORMAL LOW (ref 1.15–1.40)
Calcium, Ion: 1.38 mmol/L (ref 1.15–1.40)
Calcium, Ion: 1.35 mmol/L (ref 1.15–1.40)
Calcium, Ion: 1.28 mmol/L (ref 1.15–1.40)
HCT: 28 % — ABNORMAL LOW (ref 39.0–52.0)
HCT: 24 % — ABNORMAL LOW (ref 39.0–52.0)
HCT: 26 % — ABNORMAL LOW (ref 39.0–52.0)
HCT: 27 % — ABNORMAL LOW (ref 39.0–52.0)
HCT: 26 % — ABNORMAL LOW (ref 39.0–52.0)
HCT: 31 % — ABNORMAL LOW (ref 39.0–52.0)
HCT: 32 % — ABNORMAL LOW (ref 39.0–52.0)
HCT: 30 % — ABNORMAL LOW (ref 39.0–52.0)
Hemoglobin: 9.5 g/dL — ABNORMAL LOW (ref 13.0–17.0)
Hemoglobin: 8.2 g/dL — ABNORMAL LOW (ref 13.0–17.0)
Hemoglobin: 8.8 g/dL — ABNORMAL LOW (ref 13.0–17.0)
Hemoglobin: 9.2 g/dL — ABNORMAL LOW (ref 13.0–17.0)
Hemoglobin: 8.8 g/dL — ABNORMAL LOW (ref 13.0–17.0)
Hemoglobin: 10.5 g/dL — ABNORMAL LOW (ref 13.0–17.0)
Hemoglobin: 10.9 g/dL — ABNORMAL LOW (ref 13.0–17.0)
Hemoglobin: 10.2 g/dL — ABNORMAL LOW (ref 13.0–17.0)
O2 Saturation: 98 %
O2 Saturation: 100 %
O2 Saturation: 100 %
O2 Saturation: 100 %
O2 Saturation: 100 %
O2 Saturation: 96 %
O2 Saturation: 95 %
O2 Saturation: 98 %
Patient temperature: 35.3
Patient temperature: 37
Potassium: 4.3 mmol/L (ref 3.5–5.1)
Potassium: 4.7 mmol/L (ref 3.5–5.1)
Potassium: 5.3 mmol/L — ABNORMAL HIGH (ref 3.5–5.1)
Potassium: 6.3 mmol/L (ref 3.5–5.1)
Potassium: 5.6 mmol/L — ABNORMAL HIGH (ref 3.5–5.1)
Potassium: 5 mmol/L (ref 3.5–5.1)
Potassium: 4.7 mmol/L (ref 3.5–5.1)
Potassium: 4.4 mmol/L (ref 3.5–5.1)
Sodium: 140 mmol/L (ref 135–145)
Sodium: 139 mmol/L (ref 135–145)
Sodium: 137 mmol/L (ref 135–145)
Sodium: 136 mmol/L (ref 135–145)
Sodium: 136 mmol/L (ref 135–145)
Sodium: 139 mmol/L (ref 135–145)
Sodium: 139 mmol/L (ref 135–145)
Sodium: 138 mmol/L (ref 135–145)
TCO2: 25 mmol/L (ref 22–32)
TCO2: 26 mmol/L (ref 22–32)
TCO2: 27 mmol/L (ref 22–32)
TCO2: 25 mmol/L (ref 22–32)
TCO2: 25 mmol/L (ref 22–32)
TCO2: 26 mmol/L (ref 22–32)
TCO2: 25 mmol/L (ref 22–32)
TCO2: 23 mmol/L (ref 22–32)
pCO2 arterial: 43.3 mmHg (ref 32–48)
pCO2 arterial: 41.9 mmHg (ref 32–48)
pCO2 arterial: 44.9 mmHg (ref 32–48)
pCO2 arterial: 41.4 mmHg (ref 32–48)
pCO2 arterial: 41 mmHg (ref 32–48)
pCO2 arterial: 49.7 mmHg — ABNORMAL HIGH (ref 32–48)
pCO2 arterial: 43.8 mmHg (ref 32–48)
pCO2 arterial: 40 mmHg (ref 32–48)
pH, Arterial: 7.341 — ABNORMAL LOW (ref 7.35–7.45)
pH, Arterial: 7.385 (ref 7.35–7.45)
pH, Arterial: 7.37 (ref 7.35–7.45)
pH, Arterial: 7.37 (ref 7.35–7.45)
pH, Arterial: 7.363 (ref 7.35–7.45)
pH, Arterial: 7.292 — ABNORMAL LOW (ref 7.35–7.45)
pH, Arterial: 7.331 — ABNORMAL LOW (ref 7.35–7.45)
pH, Arterial: 7.34 — ABNORMAL LOW (ref 7.35–7.45)
pO2, Arterial: 116 mmHg — ABNORMAL HIGH (ref 83–108)
pO2, Arterial: 346 mmHg — ABNORMAL HIGH (ref 83–108)
pO2, Arterial: 391 mmHg — ABNORMAL HIGH (ref 83–108)
pO2, Arterial: 366 mmHg — ABNORMAL HIGH (ref 83–108)
pO2, Arterial: 229 mmHg — ABNORMAL HIGH (ref 83–108)
pO2, Arterial: 95 mmHg (ref 83–108)
pO2, Arterial: 73 mmHg — ABNORMAL LOW (ref 83–108)
pO2, Arterial: 106 mmHg (ref 83–108)

## 2021-08-10 LAB — POCT I-STAT, CHEM 8
BUN: 21 mg/dL (ref 8–23)
BUN: 21 mg/dL (ref 8–23)
BUN: 21 mg/dL (ref 8–23)
BUN: 19 mg/dL (ref 8–23)
BUN: 19 mg/dL (ref 8–23)
BUN: 20 mg/dL (ref 8–23)
Calcium, Ion: 1.25 mmol/L (ref 1.15–1.40)
Calcium, Ion: 1.28 mmol/L (ref 1.15–1.40)
Calcium, Ion: 1.17 mmol/L (ref 1.15–1.40)
Calcium, Ion: 1.15 mmol/L (ref 1.15–1.40)
Calcium, Ion: 1.08 mmol/L — ABNORMAL LOW (ref 1.15–1.40)
Calcium, Ion: 1.13 mmol/L — ABNORMAL LOW (ref 1.15–1.40)
Chloride: 105 mmol/L (ref 98–111)
Chloride: 105 mmol/L (ref 98–111)
Chloride: 104 mmol/L (ref 98–111)
Chloride: 104 mmol/L (ref 98–111)
Chloride: 103 mmol/L (ref 98–111)
Chloride: 106 mmol/L (ref 98–111)
Creatinine, Ser: 1.4 mg/dL — ABNORMAL HIGH (ref 0.61–1.24)
Creatinine, Ser: 1.3 mg/dL — ABNORMAL HIGH (ref 0.61–1.24)
Creatinine, Ser: 1.3 mg/dL — ABNORMAL HIGH (ref 0.61–1.24)
Creatinine, Ser: 1.2 mg/dL (ref 0.61–1.24)
Creatinine, Ser: 1.1 mg/dL (ref 0.61–1.24)
Creatinine, Ser: 1.3 mg/dL — ABNORMAL HIGH (ref 0.61–1.24)
Glucose, Bld: 112 mg/dL — ABNORMAL HIGH (ref 70–99)
Glucose, Bld: 122 mg/dL — ABNORMAL HIGH (ref 70–99)
Glucose, Bld: 136 mg/dL — ABNORMAL HIGH (ref 70–99)
Glucose, Bld: 144 mg/dL — ABNORMAL HIGH (ref 70–99)
Glucose, Bld: 137 mg/dL — ABNORMAL HIGH (ref 70–99)
Glucose, Bld: 156 mg/dL — ABNORMAL HIGH (ref 70–99)
HCT: 30 % — ABNORMAL LOW (ref 39.0–52.0)
HCT: 29 % — ABNORMAL LOW (ref 39.0–52.0)
HCT: 27 % — ABNORMAL LOW (ref 39.0–52.0)
HCT: 27 % — ABNORMAL LOW (ref 39.0–52.0)
HCT: 26 % — ABNORMAL LOW (ref 39.0–52.0)
HCT: 29 % — ABNORMAL LOW (ref 39.0–52.0)
Hemoglobin: 10.2 g/dL — ABNORMAL LOW (ref 13.0–17.0)
Hemoglobin: 9.9 g/dL — ABNORMAL LOW (ref 13.0–17.0)
Hemoglobin: 9.2 g/dL — ABNORMAL LOW (ref 13.0–17.0)
Hemoglobin: 9.2 g/dL — ABNORMAL LOW (ref 13.0–17.0)
Hemoglobin: 8.8 g/dL — ABNORMAL LOW (ref 13.0–17.0)
Hemoglobin: 9.9 g/dL — ABNORMAL LOW (ref 13.0–17.0)
Potassium: 4.3 mmol/L (ref 3.5–5.1)
Potassium: 4.8 mmol/L (ref 3.5–5.1)
Potassium: 5.7 mmol/L — ABNORMAL HIGH (ref 3.5–5.1)
Potassium: 5.9 mmol/L — ABNORMAL HIGH (ref 3.5–5.1)
Potassium: 5.7 mmol/L — ABNORMAL HIGH (ref 3.5–5.1)
Potassium: 6.4 mmol/L (ref 3.5–5.1)
Sodium: 139 mmol/L (ref 135–145)
Sodium: 138 mmol/L (ref 135–145)
Sodium: 139 mmol/L (ref 135–145)
Sodium: 135 mmol/L (ref 135–145)
Sodium: 133 mmol/L — ABNORMAL LOW (ref 135–145)
Sodium: 135 mmol/L (ref 135–145)
TCO2: 23 mmol/L (ref 22–32)
TCO2: 23 mmol/L (ref 22–32)
TCO2: 28 mmol/L (ref 22–32)
TCO2: 25 mmol/L (ref 22–32)
TCO2: 23 mmol/L (ref 22–32)
TCO2: 25 mmol/L (ref 22–32)

## 2021-08-10 LAB — URINALYSIS, ROUTINE W REFLEX MICROSCOPIC
Bilirubin Urine: NEGATIVE
Glucose, UA: NEGATIVE mg/dL
Hgb urine dipstick: NEGATIVE
Ketones, ur: NEGATIVE mg/dL
Leukocytes,Ua: NEGATIVE
Nitrite: NEGATIVE
Protein, ur: NEGATIVE mg/dL
Specific Gravity, Urine: 1.013 (ref 1.005–1.030)
pH: 5 (ref 5.0–8.0)

## 2021-08-10 LAB — HEMOGLOBIN AND HEMATOCRIT, BLOOD
HCT: 27 % — ABNORMAL LOW (ref 39.0–52.0)
Hemoglobin: 8.9 g/dL — ABNORMAL LOW (ref 13.0–17.0)

## 2021-08-10 LAB — GLUCOSE, CAPILLARY
Glucose-Capillary: 134 mg/dL — ABNORMAL HIGH (ref 70–99)
Glucose-Capillary: 136 mg/dL — ABNORMAL HIGH (ref 70–99)
Glucose-Capillary: 144 mg/dL — ABNORMAL HIGH (ref 70–99)
Glucose-Capillary: 153 mg/dL — ABNORMAL HIGH (ref 70–99)
Glucose-Capillary: 155 mg/dL — ABNORMAL HIGH (ref 70–99)
Glucose-Capillary: 161 mg/dL — ABNORMAL HIGH (ref 70–99)
Glucose-Capillary: 170 mg/dL — ABNORMAL HIGH (ref 70–99)
Glucose-Capillary: 188 mg/dL — ABNORMAL HIGH (ref 70–99)
Glucose-Capillary: 193 mg/dL — ABNORMAL HIGH (ref 70–99)

## 2021-08-10 LAB — PREPARE RBC (CROSSMATCH)

## 2021-08-10 LAB — PROTIME-INR
INR: 1.3 — ABNORMAL HIGH (ref 0.8–1.2)
Prothrombin Time: 16.1 seconds — ABNORMAL HIGH (ref 11.4–15.2)

## 2021-08-10 LAB — MAGNESIUM: Magnesium: 1.9 mg/dL (ref 1.7–2.4)

## 2021-08-10 LAB — HEPARIN LEVEL (UNFRACTIONATED): Heparin Unfractionated: 0.35 IU/mL (ref 0.30–0.70)

## 2021-08-10 LAB — PLATELET COUNT: Platelets: 195 10*3/uL (ref 150–400)

## 2021-08-10 LAB — APTT: aPTT: 30 seconds (ref 24–36)

## 2021-08-10 SURGERY — CORONARY ARTERY BYPASS GRAFTING (CABG)
Anesthesia: General | Site: Chest | Laterality: Right

## 2021-08-10 MED ORDER — FENTANYL CITRATE (PF) 250 MCG/5ML IJ SOLN
INTRAMUSCULAR | Status: AC
  Filled 2021-08-10: qty 5

## 2021-08-10 MED ORDER — PROTAMINE SULFATE 10 MG/ML IV SOLN
INTRAVENOUS | Status: AC
  Filled 2021-08-10: qty 15

## 2021-08-10 MED ORDER — LACOSAMIDE 50 MG PO TABS
100.0000 mg | ORAL_TABLET | Freq: Two times a day (BID) | ORAL | Status: DC
Start: 2021-08-10 — End: 2021-08-13
  Administered 2021-08-11 – 2021-08-13 (×5): 100 mg via ORAL
  Filled 2021-08-10 (×6): qty 2

## 2021-08-10 MED ORDER — FENTANYL CITRATE (PF) 250 MCG/5ML IJ SOLN
INTRAMUSCULAR | Status: DC | PRN
  Administered 2021-08-10: 50 ug via INTRAVENOUS
  Administered 2021-08-10: 25 ug via INTRAVENOUS
  Administered 2021-08-10 (×3): 100 ug via INTRAVENOUS
  Administered 2021-08-10: 125 ug via INTRAVENOUS
  Administered 2021-08-10: 50 ug via INTRAVENOUS
  Administered 2021-08-10: 25 ug via INTRAVENOUS
  Administered 2021-08-10: 50 ug via INTRAVENOUS
  Administered 2021-08-10: 25 ug via INTRAVENOUS
  Administered 2021-08-10: 100 ug via INTRAVENOUS
  Administered 2021-08-10: 250 ug via INTRAVENOUS
  Administered 2021-08-10: 100 ug via INTRAVENOUS

## 2021-08-10 MED ORDER — SODIUM CHLORIDE 0.9% FLUSH: 3.0000 mL | INTRAVENOUS | Status: DC | PRN

## 2021-08-10 MED ORDER — BISACODYL 10 MG RE SUPP: 10.0000 mg | Freq: Every day | RECTAL | Status: DC

## 2021-08-10 MED ORDER — ROCURONIUM BROMIDE 10 MG/ML (PF) SYRINGE
PREFILLED_SYRINGE | INTRAVENOUS | Status: DC | PRN
  Administered 2021-08-10 (×3): 50 mg via INTRAVENOUS
  Administered 2021-08-10: 100 mg via INTRAVENOUS
  Administered 2021-08-10 (×2): 50 mg via INTRAVENOUS

## 2021-08-10 MED ORDER — HEPARIN SODIUM (PORCINE) 1000 UNIT/ML IJ SOLN
INTRAMUSCULAR | Status: DC | PRN
Start: 2021-08-10 — End: 2021-08-10
  Administered 2021-08-10: 4000 [IU] via INTRAVENOUS
  Administered 2021-08-10: 36000 [IU] via INTRAVENOUS

## 2021-08-10 MED ORDER — PROTAMINE SULFATE 10 MG/ML IV SOLN
INTRAVENOUS | Status: DC | PRN
Start: 2021-08-10 — End: 2021-08-10
  Administered 2021-08-10: 400 mg via INTRAVENOUS

## 2021-08-10 MED ORDER — PROTAMINE SULFATE 10 MG/ML IV SOLN
INTRAVENOUS | Status: AC
  Filled 2021-08-10: qty 25

## 2021-08-10 MED ORDER — MIDAZOLAM HCL 2 MG/2ML IJ SOLN
2.0000 mg | INTRAMUSCULAR | Status: DC | PRN
  Administered 2021-08-10 – 2021-08-11 (×3): 2 mg via INTRAVENOUS
  Filled 2021-08-10 (×3): qty 2

## 2021-08-10 MED ORDER — ACETAMINOPHEN 650 MG RE SUPP
650.0000 mg | Freq: Once | RECTAL | Status: AC
  Administered 2021-08-10: 650 mg via RECTAL

## 2021-08-10 MED ORDER — ACETAMINOPHEN 160 MG/5ML PO SOLN
1000.0000 mg | Freq: Four times a day (QID) | ORAL | Status: AC
  Filled 2021-08-10: qty 40.6

## 2021-08-10 MED ORDER — SODIUM CHLORIDE 0.9% FLUSH
3.0000 mL | Freq: Two times a day (BID) | INTRAVENOUS | Status: DC
  Administered 2021-08-11 – 2021-08-13 (×5): 3 mL via INTRAVENOUS

## 2021-08-10 MED ORDER — ASPIRIN EC 325 MG PO TBEC: 325.0000 mg | DELAYED_RELEASE_TABLET | Freq: Every day | ORAL | Status: DC

## 2021-08-10 MED ORDER — SODIUM CHLORIDE 0.9 % IV SOLN: INTRAVENOUS | Status: DC

## 2021-08-10 MED ORDER — MIDAZOLAM HCL (PF) 10 MG/2ML IJ SOLN
INTRAMUSCULAR | Status: AC
  Filled 2021-08-10: qty 2

## 2021-08-10 MED ORDER — BISACODYL 5 MG PO TBEC
10.0000 mg | DELAYED_RELEASE_TABLET | Freq: Every day | ORAL | Status: DC
  Administered 2021-08-11 – 2021-08-14 (×3): 10 mg via ORAL
  Filled 2021-08-10 (×5): qty 2

## 2021-08-10 MED ORDER — GELATIN ABSORBABLE MT POWD
OROMUCOSAL | Status: DC | PRN
  Administered 2021-08-10: 16 mL via TOPICAL

## 2021-08-10 MED ORDER — PHENYLEPHRINE 80 MCG/ML (10ML) SYRINGE FOR IV PUSH (FOR BLOOD PRESSURE SUPPORT)
PREFILLED_SYRINGE | INTRAVENOUS | Status: AC
Start: 2021-08-10 — End: ?
  Filled 2021-08-10: qty 10

## 2021-08-10 MED ORDER — LACTATED RINGERS IV SOLN: INTRAVENOUS | Status: DC | PRN

## 2021-08-10 MED ORDER — EPHEDRINE 5 MG/ML INJ
INTRAVENOUS | Status: AC
  Filled 2021-08-10: qty 5

## 2021-08-10 MED ORDER — SODIUM CHLORIDE 0.9% FLUSH
10.0000 mL | Freq: Two times a day (BID) | INTRAVENOUS | Status: DC
  Administered 2021-08-10 – 2021-08-13 (×5): 10 mL

## 2021-08-10 MED ORDER — SODIUM CHLORIDE (PF) 0.9 % IJ SOLN
INTRAMUSCULAR | Status: AC
  Filled 2021-08-10: qty 20

## 2021-08-10 MED ORDER — HEMOSTATIC AGENTS (NO CHARGE) OPTIME
TOPICAL | Status: DC | PRN
  Administered 2021-08-10: 1 via TOPICAL

## 2021-08-10 MED ORDER — PHENYLEPHRINE HCL-NACL 20-0.9 MG/250ML-% IV SOLN
0.0000 ug/min | INTRAVENOUS | Status: DC
  Filled 2021-08-10: qty 250

## 2021-08-10 MED ORDER — ONDANSETRON HCL 4 MG/2ML IJ SOLN
4.0000 mg | Freq: Four times a day (QID) | INTRAMUSCULAR | Status: DC | PRN
  Administered 2021-08-14 – 2021-08-15 (×2): 4 mg via INTRAVENOUS
  Filled 2021-08-10 (×2): qty 2

## 2021-08-10 MED ORDER — POTASSIUM CHLORIDE 10 MEQ/50ML IV SOLN: 10.0000 meq | INTRAVENOUS | Status: AC

## 2021-08-10 MED ORDER — METOPROLOL TARTRATE 5 MG/5ML IV SOLN: 2.5000 mg | INTRAVENOUS | Status: DC | PRN

## 2021-08-10 MED ORDER — PROPOFOL 10 MG/ML IV BOLUS
INTRAVENOUS | Status: DC | PRN
  Administered 2021-08-10: 100 mg via INTRAVENOUS

## 2021-08-10 MED ORDER — HEPARIN SODIUM (PORCINE) 1000 UNIT/ML IJ SOLN
INTRAMUSCULAR | Status: AC
  Filled 2021-08-10: qty 10

## 2021-08-10 MED ORDER — SODIUM CHLORIDE 0.9 % IV SOLN
20.0000 ug | INTRAVENOUS | Status: AC
  Administered 2021-08-10: 20 ug via INTRAVENOUS
  Filled 2021-08-10: qty 5

## 2021-08-10 MED ORDER — PROPOFOL 10 MG/ML IV BOLUS
INTRAVENOUS | Status: AC
Start: 2021-08-10 — End: ?
  Filled 2021-08-10: qty 20

## 2021-08-10 MED ORDER — PANTOPRAZOLE SODIUM 40 MG PO TBEC: 40.0000 mg | DELAYED_RELEASE_TABLET | Freq: Every day | ORAL | Status: DC

## 2021-08-10 MED ORDER — LACTATED RINGERS IV SOLN
INTRAVENOUS | Status: DC | PRN
Start: 2021-08-10 — End: 2021-08-10

## 2021-08-10 MED ORDER — ORAL CARE MOUTH RINSE
15.0000 mL | OROMUCOSAL | Status: DC
  Administered 2021-08-10 – 2021-08-11 (×6): 15 mL via OROMUCOSAL

## 2021-08-10 MED ORDER — CALCIUM CHLORIDE 10 % IV SOLN
INTRAVENOUS | Status: AC
  Filled 2021-08-10: qty 10

## 2021-08-10 MED ORDER — ALBUMIN HUMAN 5 % IV SOLN
250.0000 mL | INTRAVENOUS | Status: AC | PRN
  Administered 2021-08-10 (×2): 12.5 g via INTRAVENOUS

## 2021-08-10 MED ORDER — FAMOTIDINE IN NACL 20-0.9 MG/50ML-% IV SOLN
20.0000 mg | Freq: Two times a day (BID) | INTRAVENOUS | Status: AC
  Administered 2021-08-10 (×2): 20 mg via INTRAVENOUS
  Filled 2021-08-10 (×2): qty 50

## 2021-08-10 MED ORDER — CHLORHEXIDINE GLUCONATE 0.12 % MT SOLN
15.0000 mL | OROMUCOSAL | Status: AC
  Administered 2021-08-10: 15 mL via OROMUCOSAL

## 2021-08-10 MED ORDER — DEXAMETHASONE SODIUM PHOSPHATE 10 MG/ML IJ SOLN
INTRAMUSCULAR | Status: AC
  Filled 2021-08-10: qty 1

## 2021-08-10 MED ORDER — METHYLPREDNISOLONE SODIUM SUCC 125 MG IJ SOLR
80.0000 mg | Freq: Two times a day (BID) | INTRAMUSCULAR | Status: AC
  Administered 2021-08-10 – 2021-08-11 (×2): 80 mg via INTRAVENOUS
  Filled 2021-08-10 (×2): qty 2

## 2021-08-10 MED ORDER — VANCOMYCIN HCL IN DEXTROSE 1-5 GM/200ML-% IV SOLN
1000.0000 mg | Freq: Once | INTRAVENOUS | Status: AC
  Administered 2021-08-10: 1000 mg via INTRAVENOUS
  Filled 2021-08-10: qty 200

## 2021-08-10 MED ORDER — ROCURONIUM BROMIDE 10 MG/ML (PF) SYRINGE
PREFILLED_SYRINGE | INTRAVENOUS | Status: AC
  Filled 2021-08-10: qty 10

## 2021-08-10 MED ORDER — METOPROLOL TARTRATE 12.5 MG HALF TABLET
12.5000 mg | ORAL_TABLET | Freq: Two times a day (BID) | ORAL | Status: DC
  Administered 2021-08-11 – 2021-08-12 (×4): 12.5 mg via ORAL
  Filled 2021-08-10 (×5): qty 1

## 2021-08-10 MED ORDER — EPHEDRINE SULFATE-NACL 50-0.9 MG/10ML-% IV SOSY
PREFILLED_SYRINGE | INTRAVENOUS | Status: DC | PRN
  Administered 2021-08-10: 5 mL via INTRAVENOUS

## 2021-08-10 MED ORDER — 0.9 % SODIUM CHLORIDE (POUR BTL) OPTIME
TOPICAL | Status: DC | PRN
  Administered 2021-08-10: 5000 mL

## 2021-08-10 MED ORDER — ACETAMINOPHEN 160 MG/5ML PO SOLN: 650.0000 mg | Freq: Once | ORAL | Status: AC

## 2021-08-10 MED ORDER — ROCURONIUM BROMIDE 10 MG/ML (PF) SYRINGE
PREFILLED_SYRINGE | INTRAVENOUS | Status: AC
  Filled 2021-08-10: qty 20

## 2021-08-10 MED ORDER — SUCCINYLCHOLINE CHLORIDE 200 MG/10ML IV SOSY
PREFILLED_SYRINGE | INTRAVENOUS | Status: AC
  Filled 2021-08-10: qty 10

## 2021-08-10 MED ORDER — PLASMA-LYTE A IV SOLN
INTRAVENOUS | Status: DC | PRN
  Administered 2021-08-10: 500 mL via INTRAVASCULAR

## 2021-08-10 MED ORDER — ALBUTEROL SULFATE HFA 108 (90 BASE) MCG/ACT IN AERS
INHALATION_SPRAY | RESPIRATORY_TRACT | Status: DC | PRN
  Administered 2021-08-10: 6 via RESPIRATORY_TRACT

## 2021-08-10 MED ORDER — ASPIRIN 81 MG PO CHEW: 324.0000 mg | CHEWABLE_TABLET | Freq: Every day | ORAL | Status: DC

## 2021-08-10 MED ORDER — CLONAZEPAM 1 MG PO TABS
1.0000 mg | ORAL_TABLET | Freq: Every day | ORAL | Status: DC
Start: 2021-08-11 — End: 2021-08-16
  Administered 2021-08-11 – 2021-08-15 (×5): 1 mg via ORAL
  Filled 2021-08-10 (×7): qty 1

## 2021-08-10 MED ORDER — ONDANSETRON HCL 4 MG/2ML IJ SOLN
INTRAMUSCULAR | Status: AC
  Filled 2021-08-10: qty 2

## 2021-08-10 MED ORDER — CLONAZEPAM 0.5 MG PO TABS
0.5000 mg | ORAL_TABLET | Freq: Every day | ORAL | Status: DC
  Administered 2021-08-11 – 2021-08-16 (×6): 0.5 mg via ORAL
  Filled 2021-08-10 (×5): qty 1

## 2021-08-10 MED ORDER — CHLORHEXIDINE GLUCONATE 0.12% ORAL RINSE (MEDLINE KIT)
15.0000 mL | Freq: Two times a day (BID) | OROMUCOSAL | Status: DC
  Administered 2021-08-10 – 2021-08-16 (×11): 15 mL via OROMUCOSAL

## 2021-08-10 MED ORDER — ALLOPURINOL 300 MG PO TABS
300.0000 mg | ORAL_TABLET | Freq: Every day | ORAL | Status: DC
Start: 2021-08-11 — End: 2021-08-13
  Administered 2021-08-11 – 2021-08-13 (×3): 300 mg via ORAL
  Filled 2021-08-10 (×3): qty 1

## 2021-08-10 MED ORDER — LACTATED RINGERS IV SOLN: 500.0000 mL | Freq: Once | INTRAVENOUS | Status: DC | PRN

## 2021-08-10 MED ORDER — SODIUM CHLORIDE 0.45 % IV SOLN: INTRAVENOUS | Status: DC | PRN

## 2021-08-10 MED ORDER — DEXTROSE 50 % IV SOLN: 0.0000 mL | INTRAVENOUS | Status: DC | PRN

## 2021-08-10 MED ORDER — HEMOSTATIC AGENTS (NO CHARGE) OPTIME
TOPICAL | Status: DC | PRN
Start: 2021-08-10 — End: 2021-08-10
  Administered 2021-08-10: 1 via TOPICAL

## 2021-08-10 MED ORDER — OXYCODONE HCL 5 MG PO TABS
5.0000 mg | ORAL_TABLET | ORAL | Status: DC | PRN
  Administered 2021-08-11 (×2): 10 mg via ORAL
  Administered 2021-08-12: 5 mg via ORAL
  Administered 2021-08-12 (×3): 10 mg via ORAL
  Filled 2021-08-10 (×4): qty 2
  Filled 2021-08-10: qty 1
  Filled 2021-08-10: qty 2

## 2021-08-10 MED ORDER — CHLORHEXIDINE GLUCONATE CLOTH 2 % EX PADS
6.0000 | MEDICATED_PAD | Freq: Every day | CUTANEOUS | Status: DC
  Administered 2021-08-10 – 2021-08-13 (×4): 6 via TOPICAL

## 2021-08-10 MED ORDER — ACETAMINOPHEN 500 MG PO TABS
1000.0000 mg | ORAL_TABLET | Freq: Four times a day (QID) | ORAL | Status: AC
  Administered 2021-08-11 – 2021-08-15 (×15): 1000 mg via ORAL
  Filled 2021-08-10 (×14): qty 2

## 2021-08-10 MED ORDER — SODIUM CHLORIDE 0.9 % IV SOLN: 250.0000 mL | INTRAVENOUS | Status: DC

## 2021-08-10 MED ORDER — METOPROLOL TARTRATE 25 MG/10 ML ORAL SUSPENSION: 12.5000 mg | Freq: Two times a day (BID) | ORAL | Status: DC

## 2021-08-10 MED ORDER — MORPHINE SULFATE (PF) 2 MG/ML IV SOLN
1.0000 mg | INTRAVENOUS | Status: DC | PRN
  Administered 2021-08-10 (×2): 2 mg via INTRAVENOUS
  Administered 2021-08-11: 4 mg via INTRAVENOUS
  Administered 2021-08-11 (×2): 2 mg via INTRAVENOUS
  Filled 2021-08-10: qty 2
  Filled 2021-08-10 (×3): qty 1
  Filled 2021-08-10: qty 2
  Filled 2021-08-10: qty 1

## 2021-08-10 MED ORDER — INSULIN REGULAR(HUMAN) IN NACL 100-0.9 UT/100ML-% IV SOLN
INTRAVENOUS | Status: DC
  Administered 2021-08-11: 11 [IU]/h via INTRAVENOUS
  Filled 2021-08-10: qty 100

## 2021-08-10 MED ORDER — MILRINONE LACTATE IN DEXTROSE 20-5 MG/100ML-% IV SOLN: 0.3000 ug/kg/min | INTRAVENOUS | Status: DC

## 2021-08-10 MED ORDER — HYDRALAZINE HCL 20 MG/ML IJ SOLN
10.0000 mg | INTRAMUSCULAR | Status: DC | PRN
Start: 2021-08-10 — End: 2021-08-16
  Filled 2021-08-10: qty 1

## 2021-08-10 MED ORDER — IPRATROPIUM-ALBUTEROL 0.5-2.5 (3) MG/3ML IN SOLN
3.0000 mL | Freq: Four times a day (QID) | RESPIRATORY_TRACT | Status: DC
  Administered 2021-08-10 – 2021-08-11 (×3): 3 mL via RESPIRATORY_TRACT
  Filled 2021-08-10 (×4): qty 3

## 2021-08-10 MED ORDER — LACTATED RINGERS IV SOLN: INTRAVENOUS | Status: DC

## 2021-08-10 MED ORDER — DOCUSATE SODIUM 100 MG PO CAPS
200.0000 mg | ORAL_CAPSULE | Freq: Every day | ORAL | Status: DC
  Administered 2021-08-11 – 2021-08-14 (×4): 200 mg via ORAL
  Filled 2021-08-10 (×5): qty 2

## 2021-08-10 MED ORDER — SURGIFLO WITH THROMBIN (HEMOSTATIC MATRIX KIT) OPTIME
TOPICAL | Status: DC | PRN
  Administered 2021-08-10: 1 via TOPICAL

## 2021-08-10 MED ORDER — CALCIUM CHLORIDE 10 % IV SOLN
INTRAVENOUS | Status: DC | PRN
  Administered 2021-08-10: 1 g via INTRAVENOUS

## 2021-08-10 MED ORDER — DEXMEDETOMIDINE HCL IN NACL 400 MCG/100ML IV SOLN
0.0000 ug/kg/h | INTRAVENOUS | Status: DC
  Administered 2021-08-11: 0.7 ug/kg/h via INTRAVENOUS
  Filled 2021-08-10 (×2): qty 100

## 2021-08-10 MED ORDER — CEFAZOLIN SODIUM-DEXTROSE 2-4 GM/100ML-% IV SOLN
2.0000 g | Freq: Three times a day (TID) | INTRAVENOUS | Status: AC
  Administered 2021-08-10 – 2021-08-12 (×6): 2 g via INTRAVENOUS
  Filled 2021-08-10 (×6): qty 100

## 2021-08-10 MED ORDER — NITROGLYCERIN IN D5W 200-5 MCG/ML-% IV SOLN: 0.0000 ug/min | INTRAVENOUS | Status: DC

## 2021-08-10 MED ORDER — HEPARIN SODIUM (PORCINE) 1000 UNIT/ML IJ SOLN
INTRAMUSCULAR | Status: AC
  Filled 2021-08-10: qty 1

## 2021-08-10 MED ORDER — TRAMADOL HCL 50 MG PO TABS
50.0000 mg | ORAL_TABLET | Freq: Four times a day (QID) | ORAL | Status: DC | PRN
  Filled 2021-08-10: qty 1

## 2021-08-10 MED ORDER — MIDAZOLAM HCL (PF) 5 MG/ML IJ SOLN
INTRAMUSCULAR | Status: DC | PRN
  Administered 2021-08-10 (×2): 1 mg via INTRAVENOUS
  Administered 2021-08-10: 3 mg via INTRAVENOUS

## 2021-08-10 MED ORDER — ALBUMIN HUMAN 5 % IV SOLN: INTRAVENOUS | Status: DC | PRN

## 2021-08-10 MED ORDER — EPHEDRINE SULFATE-NACL 50-0.9 MG/10ML-% IV SOSY
PREFILLED_SYRINGE | INTRAVENOUS | Status: DC | PRN
  Administered 2021-08-10: 2.5 mg via INTRAVENOUS

## 2021-08-10 MED ORDER — METOCLOPRAMIDE HCL 5 MG/ML IJ SOLN
10.0000 mg | Freq: Four times a day (QID) | INTRAMUSCULAR | Status: DC
  Administered 2021-08-10 – 2021-08-12 (×9): 10 mg via INTRAVENOUS
  Filled 2021-08-10 (×8): qty 2

## 2021-08-10 SURGICAL SUPPLY — 100 items
ADAPTER CARDIO PERF ANTE/RETRO (ADAPTER) ×4 IMPLANT
BAG DECANTER FOR FLEXI CONT (MISCELLANEOUS) ×4 IMPLANT
BLADE CLIPPER SURG (BLADE) ×4 IMPLANT
BLADE MINI RND TIP GREEN BEAV (BLADE) ×1 IMPLANT
BLADE STERNUM SYSTEM 6 (BLADE) ×4 IMPLANT
BLADE SURG 11 STRL SS (BLADE) ×1 IMPLANT
BLADE SURG 12 STRL SS (BLADE) ×4 IMPLANT
BNDG ELASTIC 4X5.8 VLCR STR LF (GAUZE/BANDAGES/DRESSINGS) ×4 IMPLANT
BNDG ELASTIC 6X5.8 VLCR STR LF (GAUZE/BANDAGES/DRESSINGS) ×4 IMPLANT
BNDG GAUZE ELAST 4 BULKY (GAUZE/BANDAGES/DRESSINGS) ×4 IMPLANT
CANISTER SUCT 3000ML PPV (MISCELLANEOUS) ×4 IMPLANT
CANNULA ARTERIAL NVNT 3/8 22FR (MISCELLANEOUS) ×1 IMPLANT
CANNULA GUNDRY RCSP 15FR (MISCELLANEOUS) ×4 IMPLANT
CATH CPB KIT VANTRIGT (MISCELLANEOUS) ×4 IMPLANT
CATH ROBINSON RED A/P 18FR (CATHETERS) ×12 IMPLANT
CATH THORACIC 28FR RT ANG (CATHETERS) ×4 IMPLANT
CLIP TI WIDE RED SMALL 24 (CLIP) ×1 IMPLANT
CONTAINER PROTECT SURGISLUSH (MISCELLANEOUS) ×8 IMPLANT
DERMABOND ADVANCED (GAUZE/BANDAGES/DRESSINGS) ×1
DERMABOND ADVANCED .7 DNX12 (GAUZE/BANDAGES/DRESSINGS) IMPLANT
DRAIN CHANNEL 32F RND 10.7 FF (WOUND CARE) ×4 IMPLANT
DRAPE CARDIOVASCULAR INCISE (DRAPES) ×1
DRAPE SLUSH/WARMER DISC (DRAPES) ×4 IMPLANT
DRAPE SRG 135X102X78XABS (DRAPES) ×3 IMPLANT
DRSG AQUACEL AG ADV 3.5X14 (GAUZE/BANDAGES/DRESSINGS) ×4 IMPLANT
ELECT BLADE 4.0 EZ CLEAN MEGAD (MISCELLANEOUS) ×4
ELECT BLADE 6.5 EXT (BLADE) ×4 IMPLANT
ELECT CAUTERY BLADE 6.4 (BLADE) ×4 IMPLANT
ELECT REM PT RETURN 9FT ADLT (ELECTROSURGICAL) ×8
ELECTRODE BLDE 4.0 EZ CLN MEGD (MISCELLANEOUS) ×3 IMPLANT
ELECTRODE REM PT RTRN 9FT ADLT (ELECTROSURGICAL) ×6 IMPLANT
FELT TEFLON 1X6 (MISCELLANEOUS) ×8 IMPLANT
GAUZE 4X4 16PLY ~~LOC~~+RFID DBL (SPONGE) ×4 IMPLANT
GAUZE SPONGE 4X4 12PLY STRL (GAUZE/BANDAGES/DRESSINGS) ×8 IMPLANT
GLOVE BIO SURGEON STRL SZ7.5 (GLOVE) ×12 IMPLANT
GOWN STRL REUS W/ TWL LRG LVL3 (GOWN DISPOSABLE) ×12 IMPLANT
GOWN STRL REUS W/TWL LRG LVL3 (GOWN DISPOSABLE) ×4
HEMOSTAT POWDER SURGIFOAM 1G (HEMOSTASIS) ×13 IMPLANT
HEMOSTAT SURGICEL 2X14 (HEMOSTASIS) ×4 IMPLANT
INSERT FOGARTY XLG (MISCELLANEOUS) IMPLANT
KIT BASIN OR (CUSTOM PROCEDURE TRAY) ×4 IMPLANT
KIT SUCTION CATH 14FR (SUCTIONS) ×4 IMPLANT
KIT TURNOVER KIT B (KITS) ×4 IMPLANT
KIT VASOVIEW HEMOPRO 2 VH 4000 (KITS) ×4 IMPLANT
LEAD PACING MYOCARDI (MISCELLANEOUS) ×4 IMPLANT
MARKER GRAFT CORONARY BYPASS (MISCELLANEOUS) ×12 IMPLANT
NS IRRIG 1000ML POUR BTL (IV SOLUTION) ×20 IMPLANT
PACK E OPEN HEART (SUTURE) ×4 IMPLANT
PACK OPEN HEART (CUSTOM PROCEDURE TRAY) ×4 IMPLANT
PAD ARMBOARD 7.5X6 YLW CONV (MISCELLANEOUS) ×8 IMPLANT
PAD ELECT DEFIB RADIOL ZOLL (MISCELLANEOUS) ×4 IMPLANT
PENCIL BUTTON HOLSTER BLD 10FT (ELECTRODE) ×4 IMPLANT
POSITIONER HEAD DONUT 9IN (MISCELLANEOUS) ×4 IMPLANT
POWDER SURGICEL 3.0 GRAM (HEMOSTASIS) ×4 IMPLANT
PUNCH AORTIC ROTATE 4.5MM 8IN (MISCELLANEOUS) ×1 IMPLANT
SET MPS 3-ND DEL (MISCELLANEOUS) ×1 IMPLANT
SHEATH PROBE COVER 6X72 (BAG) ×1 IMPLANT
SPONGE T-LAP 18X18 ~~LOC~~+RFID (SPONGE) ×18 IMPLANT
SPONGE T-LAP 4X18 ~~LOC~~+RFID (SPONGE) ×5 IMPLANT
SUPPORT HEART JANKE-BARRON (MISCELLANEOUS) ×4 IMPLANT
SURGIFLO W/THROMBIN 8M KIT (HEMOSTASIS) ×4 IMPLANT
SUT BONE WAX W31G (SUTURE) ×4 IMPLANT
SUT MNCRL AB 4-0 PS2 18 (SUTURE) IMPLANT
SUT PROLENE 3 0 SH DA (SUTURE) IMPLANT
SUT PROLENE 3 0 SH1 36 (SUTURE) IMPLANT
SUT PROLENE 4 0 RB 1 (SUTURE) ×1
SUT PROLENE 4 0 SH DA (SUTURE) ×5 IMPLANT
SUT PROLENE 4-0 RB1 .5 CRCL 36 (SUTURE) ×3 IMPLANT
SUT PROLENE 5 0 C 1 36 (SUTURE) ×2 IMPLANT
SUT PROLENE 6 0 C 1 30 (SUTURE) ×4 IMPLANT
SUT PROLENE 6 0 CC (SUTURE) ×12 IMPLANT
SUT PROLENE 7 0 BV 1 (SUTURE) ×1 IMPLANT
SUT PROLENE 8 0 BV175 6 (SUTURE) IMPLANT
SUT PROLENE BLUE 7 0 (SUTURE) ×4 IMPLANT
SUT PROLENE POLY MONO (SUTURE) ×3 IMPLANT
SUT SILK  1 MH (SUTURE)
SUT SILK 1 MH (SUTURE) IMPLANT
SUT SILK 2 0 SH CR/8 (SUTURE) ×1 IMPLANT
SUT SILK 3 0 SH CR/8 (SUTURE) ×1 IMPLANT
SUT STEEL 6MS V (SUTURE) ×8 IMPLANT
SUT STEEL STERNAL CCS#1 18IN (SUTURE) ×1 IMPLANT
SUT STEEL SZ 6 DBL 3X14 BALL (SUTURE) ×5 IMPLANT
SUT VIC AB 1 CTX 27 (SUTURE) ×1 IMPLANT
SUT VIC AB 1 CTX 36 (SUTURE) ×2
SUT VIC AB 1 CTX36XBRD ANBCTR (SUTURE) ×6 IMPLANT
SUT VIC AB 2-0 CT1 27 (SUTURE) ×1
SUT VIC AB 2-0 CT1 TAPERPNT 27 (SUTURE) IMPLANT
SUT VIC AB 2-0 CTX 27 (SUTURE) IMPLANT
SUT VIC AB 2-0 CTX 36 (SUTURE) ×1 IMPLANT
SUT VIC AB 3-0 X1 27 (SUTURE) IMPLANT
SUT VICRYL 3-0 27 CRC X-1 (SUTURE) ×1 IMPLANT
SYSTEM SAHARA CHEST DRAIN ATS (WOUND CARE) ×4 IMPLANT
TAPE CLOTH SOFT 2X10 (GAUZE/BANDAGES/DRESSINGS) ×1 IMPLANT
TAPE PAPER 2X10 WHT MICROPORE (GAUZE/BANDAGES/DRESSINGS) ×1 IMPLANT
TOWEL GREEN STERILE (TOWEL DISPOSABLE) ×4 IMPLANT
TOWEL GREEN STERILE FF (TOWEL DISPOSABLE) ×4 IMPLANT
TRAY FOLEY SLVR 16FR TEMP STAT (SET/KITS/TRAYS/PACK) ×4 IMPLANT
TUBING LAP HI FLOW INSUFFLATIO (TUBING) ×4 IMPLANT
UNDERPAD 30X36 HEAVY ABSORB (UNDERPADS AND DIAPERS) ×4 IMPLANT
WATER STERILE IRR 1000ML POUR (IV SOLUTION) ×8 IMPLANT

## 2021-08-10 NOTE — Anesthesia Postprocedure Evaluation (Signed)
Anesthesia Post Note ? ?Patient: Edward Pitts ? ?Procedure(s) Performed: CORONARY ARTERY BYPASS GRAFTING (CABG) X 3 USING LEFT INTERNAL MAMMARY ARTERY AND RIGHT GREATER SAPHENOUS VEIN (Chest) ?TRANSESOPHAGEAL ECHOCARDIOGRAM (TEE) ?ENDOVEIN HARVEST OF GREATER SAPHENOUS VEIN (Right) ? ?  ? ?Patient location during evaluation: ICU ?Anesthesia Type: General ?Level of consciousness: sedated and patient remains intubated per anesthesia plan ?Pain management: pain level controlled ?Vital Signs Assessment: post-procedure vital signs reviewed and stable ?Respiratory status: patient remains intubated per anesthesia plan ?Cardiovascular status: stable ?Postop Assessment: no apparent nausea or vomiting ?Anesthetic complications: no ?Comments: Upon arriving in ICU, concern for tear in ETT cuff, not getting adequate volumes, cuff feels like its not holding air. Arrived in ICU, placed glidescope in mouth. Significant swelling in oropharynx noted, significant secretions despite suctioning. Patient on 100% O2 to preoxygenate. Bougie used for ETT exchange, uneventful. Bilateral breath sounds confirmed, ETT secured in place. Will get CXR to confirm adequate ETT placement.  ? ? ?No notable events documented. ? ?Last Vitals:  ?Vitals:  ? 08/10/21 0819 08/10/21 0820  ?BP:    ?Pulse: 61 61  ?Resp: (!) 22 18  ?Temp:    ?SpO2: 99% 98%  ?  ?Last Pain:  ?Vitals:  ? 08/10/21 0459  ?TempSrc: Oral  ?PainSc:   ? ? ?  ?  ?  ?  ?  ?  ? ?Audry Pili ? ? ? ? ?

## 2021-08-10 NOTE — Progress Notes (Signed)
A cuff leak was noted by this RT after patient arrived from the OR. CRNA was made aware and called MD to intubate. Patient's tube was exchanged via bougie by anesthesia with an 8.0 at 25 at the lip. Dr. Prescott Gum was made aware and verbal orders were given to leave patent intubated overnight.  ?

## 2021-08-10 NOTE — Anesthesia Procedure Notes (Signed)
Procedure Name: Intubation ?Date/Time: 08/10/2021 9:07 AM ?Performed by: Thelma Comp, CRNA ?Pre-anesthesia Checklist: Patient identified, Emergency Drugs available, Suction available and Patient being monitored ?Patient Re-evaluated:Patient Re-evaluated prior to induction ?Oxygen Delivery Method: Circle System Utilized ?Preoxygenation: Pre-oxygenation with 100% oxygen ?Induction Type: IV induction ?Ventilation: Oral airway inserted - appropriate to patient size and Two handed mask ventilation required ?Laryngoscope Size: Glidescope and 3 ?Grade View: Grade I ?Tube type: Oral ?Tube size: 8.0 mm ?Number of attempts: 1 ?Airway Equipment and Method: Oral airway and Rigid stylet ?Placement Confirmation: ETT inserted through vocal cords under direct vision, positive ETCO2 and breath sounds checked- equal and bilateral ?Secured at: 23 cm ?Tube secured with: Tape ?Dental Injury: Teeth and Oropharynx as per pre-operative assessment  ?Comments: Performed by Justin Mend, SRNA under direct supervision of MDA and CRNA. Initial attempt to DL with MIller 3 blade, unable to achieve view. Glidescope used to achieve grade 1 view. ? ? ? ? ?

## 2021-08-10 NOTE — Progress Notes (Signed)
[employer] rep notified that Dr. Roanna Banning would like pacemaker to be turned off for pt surgery with Dr. Darcey Nora. Rep will be on the way.  ?

## 2021-08-10 NOTE — Brief Op Note (Signed)
08/08/2021 - 08/10/2021 ? ?12:58 PM ? ?PATIENT:  Edward Pitts  61 y.o. male ? ?PRE-OPERATIVE DIAGNOSIS:  1. S/p NSTEMI 2. CAD ? ?POST-OPERATIVE DIAGNOSIS:  1. S/p NSTEMI 2. CAD ? ?PROCEDURES: TRANSESOPHAGEAL ECHOCARDIOGRAM (TEE) : CORONARY ARTERY  BYPASS GRAFTING (CABG) X 3 (LIMA to LAD, SVG to OM, SVG to RCA) USING LEFT INTERNAL MAMMARY ARTERY AND RIGHT GREATER SAPHENOUS VEIN and ENDOVEIN HARVEST OF GREATER SAPHENOUS VEIN (Right) ? ?Vein harvest time: 28 min Vein prep time: 16 min ? ?SURGEON:  Surgeon(s) and Role: ?   Ivin Poot, MD - Primary ? ?PHYSICIAN ASSISTANT: Lars Pinks PA-C ? ?ASSISTANTS: Ardyth Man RNFA  ? ?ANESTHESIA:   general ? ?EBL:  Per anesthesia, perfusion record ? ?DRAINS:  Chest tubes placed in the mediastinal and pleural spaces   ? ?COUNTS CORRECT:  YES ? ?DICTATION: .Dragon Dictation ? ?PLAN OF CARE: Admit to inpatient  ? ?PATIENT DISPOSITION:  ICU - intubated and hemodynamically stable. ?  ?Delay start of Pharmacological VTE agent (>24hrs) due to surgical blood loss or risk of bleeding: no ? ? ?BASELINE WEIGHT:112.3 kg ?

## 2021-08-10 NOTE — Discharge Instructions (Addendum)
Prediabetes Eating Plan Prediabetes is a condition that causes blood sugar (glucose) levels to be higher than normal. This increases the risk for developing type 2 diabetes (type 2 diabetes mellitus). Working with a health care provider or nutrition specialist (dietitian) to make diet and lifestyle changes can help prevent the onset of diabetes. These changes may help you: Control your blood glucose levels. Improve your cholesterol levels. Manage your blood pressure. What are tips for following this plan? Reading food labels Read food labels to check the amount of fat, salt (sodium), and sugar in prepackaged foods. Avoid foods that have: Saturated fats. Trans fats. Added sugars. Avoid foods that have more than 300 milligrams (mg) of sodium per serving. Limit your sodium intake to less than 2,300 mg each day. Shopping Avoid buying pre-made and processed foods. Avoid buying drinks with added sugar. Cooking Cook with olive oil. Do not use butter, lard, or ghee. Bake, broil, grill, steam, or boil foods. Avoid frying. Meal planning Work with your dietitian to create an eating plan that is right for you. This may include tracking how many calories you take in each day. Use a food diary, notebook, or mobile application to track what you eat at each meal. Consider following a Mediterranean diet. This includes: Eating several servings of fresh fruits and vegetables each day. Eating fish at least twice a week. Eating one serving each day of whole grains, beans, nuts, and seeds. Using olive oil instead of other fats. Limiting alcohol. Limiting red meat. Using nonfat or low-fat dairy products. Consider following a plant-based diet. This includes dietary choices that focus on eating mostly vegetables and fruit, grains, beans, nuts, and seeds. If you have high blood pressure, you may need to limit your sodium intake or follow a diet such as the DASH (Dietary Approaches to Stop Hypertension) eating  plan. The DASH diet aims to lower high blood pressure. Lifestyle Set weight loss goals with help from your health care team. It is recommended that most people with prediabetes lose 7% of their body weight. Exercise for at least 30 minutes 5 or more days a week. Attend a support group or seek support from a mental health counselor. Take over-the-counter and prescription medicines only as told by your health care provider. What foods are recommended? Fruits Berries. Bananas. Apples. Oranges. Grapes. Papaya. Mango. Pomegranate. Kiwi. Grapefruit. Cherries. Vegetables Lettuce. Spinach. Peas. Beets. Cauliflower. Cabbage. Broccoli. Carrots. Tomatoes. Squash. Eggplant. Herbs. Peppers. Onions. Cucumbers. Brussels sprouts. Grains Whole grains, such as whole-wheat or whole-grain breads, crackers, cereals, and pasta. Unsweetened oatmeal. Bulgur. Barley. Quinoa. Brown rice. Corn or whole-wheat flour tortillas or taco shells. Meats and other proteins Seafood. Poultry without skin. Lean cuts of pork and beef. Tofu. Eggs. Nuts. Beans. Dairy Low-fat or fat-free dairy products, such as yogurt, cottage cheese, and cheese. Beverages Water. Tea. Coffee. Sugar-free or diet soda. Seltzer water. Low-fat or nonfat milk. Milk alternatives, such as soy or almond milk. Fats and oils Olive oil. Canola oil. Sunflower oil. Grapeseed oil. Avocado. Walnuts. Sweets and desserts Sugar-free or low-fat pudding. Sugar-free or low-fat ice cream and other frozen treats. Seasonings and condiments Herbs. Sodium-free spices. Mustard. Relish. Low-salt, low-sugar ketchup. Low-salt, low-sugar barbecue sauce. Low-fat or fat-free mayonnaise. The items listed above may not be a complete list of recommended foods and beverages. Contact a dietitian for more information. What foods are not recommended? Fruits Fruits canned with syrup. Vegetables Canned vegetables. Frozen vegetables with butter or cream sauce. Grains Refined white  flour and flour products,  such as bread, pasta, snack foods, and cereals. Meats and other proteins Fatty cuts of meat. Poultry with skin. Breaded or fried meat. Processed meats. Dairy Full-fat yogurt, cheese, or milk. Beverages Sweetened drinks, such as iced tea and soda. Fats and oils Butter. Lard. Ghee. Sweets and desserts Baked goods, such as cake, cupcakes, pastries, cookies, and cheesecake. Seasonings and condiments Spice mixes with added salt. Ketchup. Barbecue sauce. Mayonnaise. The items listed above may not be a complete list of foods and beverages that are not recommended. Contact a dietitian for more information. Where to find more information American Diabetes Association: www.diabetes.org Summary You may need to make diet and lifestyle changes to help prevent the onset of diabetes. These changes can help you control blood sugar, improve cholesterol levels, and manage blood pressure. Set weight loss goals with help from your health care team. It is recommended that most people with prediabetes lose 7% of their body weight. Consider following a Mediterranean diet. This includes eating plenty of fresh fruits and vegetables, whole grains, beans, nuts, seeds, fish, and low-fat dairy, and using olive oil instead of other fats. This information is not intended to replace advice given to you by your health care provider. Make sure you discuss any questions you have with your health care provider. Document Revised: 06/12/2019 Document Reviewed: 06/12/2019 Elsevier Patient Education  [place].   Discharge Instructions:  1. You may shower, please wash incisions daily with soap and water and keep dry.  If you wish to cover wounds with dressing you may do so but please keep clean and change daily.  No tub baths or swimming until incisions have completely healed.  If your incisions become red or develop any drainage please call our office at [phone]  2. No Driving until  cleared by Dr. Lucianne Lei Trigt's office and you are no longer using narcotic pain medications  3. Monitor your weight daily.. Please use the same scale and weigh at same time... If you gain 5-10 lbs in 48 hours with associated lower extremity swelling, please contact our office at [phone]  4. Fever of 101.5 for at least 24 hours with no source, please contact our office at [phone]  5. Activity- up as tolerated, please walk at least 3 times per day.  Avoid strenuous activity, no lifting, pushing, or pulling with your arms over 8-10 lbs for a minimum of 6 weeks  6. If any questions or concerns arise, please do not hesitate to contact our office at [phone]   Information on my medicine - ELIQUIS (apixaban)   Why was Eliquis prescribed for you? Eliquis was prescribed for you to reduce the risk of a blood clot forming that can cause a stroke if you have a medical condition called atrial fibrillation (a type of irregular heartbeat).  What do You need to know about Eliquis ? Take your Eliquis TWICE DAILY - one tablet in the morning and one tablet in the evening with or without food. If you have difficulty swallowing the tablet whole please discuss with your pharmacist how to take the medication safely.  Take Eliquis exactly as prescribed by your doctor and DO NOT stop taking Eliquis without talking to the doctor who prescribed the medication.  Stopping may increase your risk of developing a stroke.  Refill your prescription before you run out.  After discharge, you should have regular check-up appointments with your healthcare provider that is prescribing your Eliquis.  In the future your dose may need to be changed  if your kidney function or weight changes by a significant amount or as you get older.  What do you do if you miss a dose? If you miss a dose, take it as soon as you remember on the same day and resume taking twice daily.  Do not take more than one dose of ELIQUIS at the  same time to make up a missed dose.  Important Safety Information A possible side effect of Eliquis is bleeding. You should call your healthcare provider right away if you experience any of the following: Bleeding from an injury or your nose that does not stop. Unusual colored urine (red or dark brown) or unusual colored stools (red or black). Unusual bruising for unknown reasons. A serious fall or if you hit your head (even if there is no bleeding).  Some medicines may interact with Eliquis and might increase your risk of bleeding or clotting while on Eliquis. To help avoid this, consult your healthcare provider or pharmacist prior to using any new prescription or non-prescription medications, including herbals, vitamins, non-steroidal anti-inflammatory drugs (NSAIDs) and supplements.  This website has more information on Eliquis (apixaban): http://www.eliquis.com/eliquis/home

## 2021-08-10 NOTE — Discharge Summary (Addendum)
Physician Discharge Summary       [address]       [place] [address]             [phone]    Patient ID: Vanessa Alesi MRN: [id number] DOB/AGE: [birth year] 62 y.o.  Admit date: 08/08/2021 Discharge date: 08/16/2021  Admission Diagnoses:  NSTEMI (non-ST elevated myocardial infarction) ([facility]) 2. Coronary artery disease involving native coronary artery of native heart  Discharge Diagnoses:  S/p CABG x 3 Expected post op blood loss anemia  3. 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  Coronary artery bypass grafting x3 (left internal mammary artery to LAD, saphenous vein graft to circumflex marginal, saphenous vein graft to RCA). 2.  Endoscopic harvest of right leg greater saphenous vein by Dr. Prescott Gum on 08/11/2021.   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  Mr. Mandler presented to the [facility] emergency room via EMS on 08/05/2021 after sudden onset of chest pain associated with dizziness and left arm tingling while mowing using a riding lawnmower.  Initial troponin in the emergency room was 23 and later rose to a peak of 272.  EKG showed a paced rhythm with no evidence of acute  ischemia.  Chest x-ray was unrevealing.  He was admitted to [facility] hospital by the hospitalist service with acute non-ST elevation myocardial infarction.  He was started on a heparin drip.  His chest pain subsided by the time of his admission.  Dr. Nehemiah Massed of the cardiology service was consulted.  He recommended echocardiography and left heart catheterization.  The echocardiogram showed left ventricular ejection fraction of 65 to 70%, normal RV function, and trivial MR.  The study was otherwise unremarkable.  Left heart catheterization was performed yesterday showing an 80% left main coronary artery stenosis and a 45% lesion in the mid RCA.  Mr. Draheim was transferred to [facility] for further evaluation and potential surgical revascularization for his high-grade left main coronary artery stenosis.   Currently, Mr. Steinhart is resting comfortably in bed with a heparin infusion running.  Denies any chest pain since admission.  He is retired from the [employer] and lives on [employer] and disability.  He lives alone but has a brother in [place] that he said may be able to come and stay with him for a period of time after discharge.     He is right-hand dominant.  He has chronic edema in his left lower extremity following extensive surgical resection at the left groin level followed by radiation to the site.  Dr. Prescott Gum discussed the need for coronary artery bypass grafting surgery. Potential risks, benefits, and complications were discussed with the patient and he agreed to proceed with surgery. Pre operative carotid US showed no significant internal carotid artery stenosis bilaterally.  Hospital Course: Patient underwent  a CABG x 3. He was transferred from the OR to [facility] ICU in stable condition. He was extubated on post op day one. He was weaned off Neo Synephrine and Milrinone drips. [name], a line, and foley were removed early in his post op course. Chest tubes remained for a few  days and once output decreased, were removed. He was weaned off the Insulin drip. His pre op HGA1C was 5.7. He likely has pre diabetes. He will need further surveillance of his HGA1C after discharge. We will provide nutrition information with discharge paperwork.  He does have an expected acute blood loss anemia and we are monitoring his hemoglobin and hematocrit.  He does have a chronic renal insufficiency with creatinine ranging in the 1.1-1.5 range preoperatively.  On postop day #4 it was 1.5.  He does have some hypertension but creatinine does limit the ability to resume his ACE inhibitor and he may require an additional agent.  Oxygen has been weaned and he maintains good saturations on room air.  Incisions are healing well without evidence of infection.  He is tolerating gradually increasing activities using standard cardiac rehab phase 1 modalities.  He has maintained V pacing on his PPM.  The patient's creatinine level rose as high as 2.0, due to this diuretics were stopped.  His creatinine level returned to baseline of 1.55  The patient remains clinically stable.  Surgical incisions are healing without evidence of infection.  Home health arrangements have been made.  He is medically stable for discharge home today.    Latest Vital Signs: Blood pressure (!) 123/50, pulse 62, temperature 98 F (36.7 C), temperature source Oral, resp. rate 16, height '5\' 8"'$  (1.727 m), weight 108.8 kg, SpO2 95 %.  Physical Exam:  General appearance: alert, cooperative, and no distress Heart: regular rate and rhythm Lungs: clear to auscultation bilaterally Abdomen: soft, non-tender; bowel sounds normal; no masses,  no organomegaly Extremities: edema trace Wound: clean and dry  Discharge Condition:Stable and discharged to home.  Recent laboratory studies:  Lab Results  Component Value Date   WBC 9.0 08/15/2021   HGB 10.5 (L) 08/15/2021   HCT 31.2 (L) 08/15/2021   MCV 89.9 08/15/2021   PLT 215 08/15/2021    Lab Results  Component Value Date   NA 138 08/16/2021   K 4.3 08/16/2021   CL 105 08/16/2021   CO2 23 08/16/2021   CREATININE 1.55 (H) 08/16/2021   GLUCOSE 121 (H) 08/16/2021      Diagnostic Studies: DG Chest 2 View  Result Date: 08/14/2021 CLINICAL DATA:  Elevated myocardial infarction. EXAM: CHEST - 2 VIEW COMPARISON:  Aug 13, 2021 FINDINGS: Stable right Port-A-Cath in good position. Stable pacemaker. The cardiomediastinal silhouette is stable. Sternotomy wires are intact. No pneumothorax. No nodules or masses. No focal infiltrates. No overt edema. IMPRESSION: No active cardiopulmonary disease. Electronically Signed   By: Dorise Bullion III M.D.   On: 08/14/2021 08:51   DG Chest 2 View  Result Date: 08/10/2021 CLINICAL DATA:  Scheduled for CABG May 17. Preoperative chest radiograph. EXAM: CHEST - 2 VIEW COMPARISON:  Portable chest 08/05/2021 FINDINGS: The heart size and mediastinal contours are within normal limits. Both lungs are clear. The visualized skeletal structures are unremarkable. A right IJ port catheter again terminates at the level of the cavoatrial junction. There is a left chest dual lead pacing system with stable wire insertions. IMPRESSION: No evidence of acute chest disease or interval changes. Electronically Signed   By: Lanny Hurst  Chesser M.D.   On: 08/10/2021 00:33   DG Abd 1 View  Result Date: 08/10/2021 CLINICAL DATA:  Encounter for OG tube placement. EXAM: ABDOMEN - 1 VIEW COMPARISON:  Recent similar study today at 9:51 p.m. FINDINGS: 10:48 p.m., 08/10/2021. OG tube is in place interval advanced with tip now in the body of the stomach and appears adequately inserted. The bowel pattern is nonobstructive as far as visualized with the pelvic bowel not included in the exam. The lateral aspect of the right hemiabdomen was also cropped out. There is no supine evidence of free air. The visible visceral shadows are stable. No pathologic calcification is visible. IMPRESSION: OGT  has been advanced with the tip now in the body of the stomach, grossly adequately inserted. Electronically Signed   By: Telford Nab M.D.   On: 08/10/2021 23:26   DG Abd 1 View  Result Date: 08/10/2021 CLINICAL DATA:  Check gastric catheter placement EXAM: ABDOMEN - 1 VIEW COMPARISON:  None Available. FINDINGS: Gastric catheter is noted with the tip in the stomach. Proximal side port lies in the distal esophagus. This should be advanced several cm deeper into the stomach. IMPRESSION: Gastric catheter as described. This should be advanced deeper into the stomach. Electronically Signed   By: Inez Catalina M.D.   On: 08/10/2021 21:58   CARDIAC CATHETERIZATION  Result Date: 08/08/2021   Mid RCA lesion is 45% stenosed.   Mid LM lesion is 80% stenosed.   The left ventricular systolic function is normal.   LV end diastolic pressure is normal.   The left ventricular ejection fraction is greater than 65% by visual estimate. 60 year old male with known cardiovascular disease hypertension hypertension lipidemia and previous complete heart block status post dual-chamber pacemaker placement having acute non-ST elevation myocardial infarction. Normal LV systolic function with mild septal hypokinesis due to pacemaker with ejection fraction of 50% Significant progression of distal left main coronary atherosclerosis to 75 to 80% and proximal right coronary artery to 40% Plan Proceed to coronary artery bypass grafting due to significant progression of left main coronary artery disease, non-ST elevation myocardial infarction, and abnormal stress test 3/23 High intensity cholesterol therapy Cardiac rehabilitation Further treatment options after above   DG Chest Port 1 View  Result Date: 08/13/2021 CLINICAL DATA:  Status post coronary artery bypass surgery EXAM: PORTABLE CHEST 1 VIEW COMPARISON:  Previous studies including the examination of 08/12/2021 FINDINGS: Left chest tube is seen. There is no demonstrable  pneumothorax. There is interval decrease in linear densities in the medial left lower lung fields. There are no signs of pulmonary edema or new focal infiltrates. There is coronary bypass surgery. Tip of right IJ chest port is seen in the superior vena cava. Pacemaker battery is seen in the left infraclavicular region. There is interval removal of vascular sheath from right IJ. IMPRESSION: There is slight improvement in subsegmental atelectasis seen in the left lower lung fields. Other findings as described in the body of the report. Electronically Signed   By: Elmer Picker M.D.   On: 08/13/2021 09:43   DG Chest Port 1 View  Result Date: 08/12/2021 CLINICAL DATA:  History of CABG. EXAM: PORTABLE CHEST 1 VIEW COMPARISON:  Aug 11, 2021. FINDINGS: RIGHT IJ vascular sheath remains in place terminating in the region of the brachiocephalic junction. RIGHT-sided Port-A-Cath with tip in the area of the caval to atrial junction. LEFT sided chest tube in place, tip near the LEFT lung apex. Lower mediastinal drain tip projecting over the  lower chest/upper abdomen on the radiograph. LEFT-sided dual lead pacer device remains in place. Post median sternotomy for CABG. No change in cardiomediastinal contours. Lung volumes are slightly increased following extubation. No visible pneumothorax. No dense consolidation with subtle basilar opacities LEFT greater than RIGHT and with slight improvement since the prior study. On limited assessment no acute skeletal findings. IMPRESSION: 1. Status post extubation with improved lung volumes. 2. No pneumothorax. 3. Slight improvement in aeration of the lung bases with subtle basilar opacities LEFT greater than RIGHT, likely attributed to atelectasis. 4. Removal of Swan-Ganz catheter, vascular sheath remains in place via RIGHT IJ approach. Electronically Signed   By: Zetta Bills M.D.   On: 08/12/2021 07:56   DG Chest Port 1 View  Result Date: 08/11/2021 CLINICAL DATA:  Status  post CABG yesterday. EXAM: PORTABLE CHEST 1 VIEW COMPARISON:  Chest x-ray from yesterday. FINDINGS: Unchanged endotracheal tube, right internal jugular Swan-Ganz catheter, right chest wall port catheter, mediastinal drain, and left chest tube. Unchanged left chest wall pacemaker. Stable cardiomediastinal silhouette status post CABG. Unchanged retrocardiac left lower lobe atelectasis. No focal consolidation, pleural effusion, or pneumothorax. IMPRESSION: 1. Stable support lines and tubes. 2. Unchanged left lower lobe atelectasis. No pneumothorax. Electronically Signed   By: Titus Dubin M.D.   On: 08/11/2021 07:52   DG Chest Port 1 View  Result Date: 08/10/2021 CLINICAL DATA:  Intubation EXAM: PORTABLE CHEST 1 VIEW COMPARISON:  08/10/2021 at 3:21 p.m. FINDINGS: Single frontal view of the chest demonstrates endotracheal tube overlying tracheal air column tip just below thoracic inlet. Right chest wall port and flow directed right internal jugular central venous catheter unchanged. Left chest tube and mediastinal drain are again noted and stable. Epicardial pacing wires and dual lead pacemaker are unchanged. Postsurgical changes from CABG. Cardiac silhouette is unremarkable. Stable consolidation within the medial left lung base consistent with atelectasis. No effusion or pneumothorax. No acute bony abnormalities. IMPRESSION: 1. Support devices as above, with appropriate position of the endotracheal tube just below thoracic inlet. 2. Continued left lower lobe atelectasis. 3. No evidence of pneumothorax. Electronically Signed   By: Randa Ngo M.D.   On: 08/10/2021 17:14   DG Chest Port 1 View  Result Date: 08/10/2021 CLINICAL DATA:  Pneumothorax. EXAM: PORTABLE CHEST 1 VIEW COMPARISON:  Radiographs 08/10/2021 and 08/05/2021.  CT 04/26/2021. FINDINGS: 1521 hours. Interval median sternotomy and CABG. Tip of the endotracheal tube is just above the carina and could be pulled back 3 cm for more optimal  positioning. Right IJ Swan-Ganz catheter extends into the main pulmonary artery. A right IJ Port-A-Cath appears unchanged at the level of the upper right atrium. Left subclavian pacemaker leads appear unchanged. A left-sided chest tube is in place. There are low lung volumes with mild left lung atelectasis. No evidence of pneumothorax or significant pleural effusion. The heart size is stable. IMPRESSION: 1. Interval CABG without evidence of pneumothorax. 2. Support system positioned as above. Tip of the endotracheal tube is just above the carina and could be pulled back 3 cm for more optimal positioning. Electronically Signed   By: Richardean Sale M.D.   On: 08/10/2021 15:38   DG Chest Port 1 View  Result Date: 08/05/2021 CLINICAL DATA:  Chest pain and dizziness after mowing the yard. EXAM: PORTABLE CHEST 1 VIEW COMPARISON:  05/27/2021 FINDINGS: The right IJ power port is stable. The pacer wires are stable. The cardiac silhouette, mediastinal and hilar contours are within normal limits and unchanged. The lungs are clear.  No pleural effusions. No pulmonary lesions. The bony thorax is intact. IMPRESSION: No acute cardiopulmonary findings. Electronically Signed   By: Marijo Sanes M.D.   On: 08/05/2021 12:13   ECHOCARDIOGRAM COMPLETE  Result Date: 08/08/2021    ECHOCARDIOGRAM REPORT   Patient Name:   LONNELL CHAPUT Date of Exam: 08/08/2021 Medical Rec #:  [id number]    Height:       67.0 in Accession #:    [id number]   Weight:       250.0 lb Date of Birth:  1960-09-14    BSA:          2.223 m Patient Age:    57 years     BP:           138/66 mmHg Patient Gender: M            HR:           68 bpm. Exam Location:  [facility] Procedure: 2D Echo, Cardiac Doppler and Color Doppler Indications:     NSTEMI I21.4  History:         Patient has prior history of Echocardiogram examinations, most                  recent 08/26/2018. CAD, Pacemaker, Arrythmias:LBBB; Risk                  Factors:Hypertension.  Sonographer:     Sherrie Sport Referring Phys:  [id number] AGBATA Diagnosing Phys: Yolonda Kida MD  Sonographer Comments: Suboptimal apical window and no subcostal window. IMPRESSIONS  1. Left ventricular ejection fraction, by estimation, is 65 to 70%. The left ventricle has normal function. The left ventricle has no regional wall motion abnormalities. Left ventricular diastolic parameters are consistent with Grade I diastolic dysfunction (impaired relaxation).  2. Right ventricular systolic function is normal. The right ventricular size is normal.  3. The mitral valve is normal in structure. Trivial mitral valve regurgitation.  4. The aortic valve is normal in structure. Aortic valve regurgitation is not visualized. FINDINGS  Left Ventricle: Left ventricular ejection fraction, by estimation, is 65 to 70%. The left ventricle has normal function. The left ventricle has no regional wall motion abnormalities. The left ventricular internal cavity size was normal in size. There is  no left ventricular hypertrophy. Left ventricular diastolic parameters are consistent with Grade I diastolic dysfunction (impaired relaxation). Right Ventricle: The right ventricular size is normal. No increase in right ventricular wall thickness. Right ventricular systolic function is normal. Left Atrium: Left atrial size was normal in size. Right Atrium: Right atrial size was normal in size. Pericardium: There is no evidence of pericardial effusion. Mitral Valve: The mitral valve is normal in structure. Trivial mitral valve regurgitation. MV peak gradient, 3.9 mmHg. The mean mitral valve gradient is 2.0 mmHg. Tricuspid Valve: The tricuspid valve is normal in structure. Tricuspid valve regurgitation is trivial. Aortic Valve: The aortic valve is normal in structure. Aortic valve regurgitation is not visualized. Aortic valve mean gradient measures 5.0 mmHg. Aortic valve peak gradient measures 8.4 mmHg. Aortic valve area, by VTI measures 2.02 cm. Pulmonic  Valve: The pulmonic valve was normal in structure. Pulmonic valve regurgitation is not visualized. Aorta: The ascending aorta was not well visualized. IAS/Shunts: No atrial level shunt detected by color flow Doppler.  LEFT VENTRICLE PLAX 2D LVIDd:         5.40 cm   Diastology LVIDs:         3.40 cm  LV e' medial:    7.94 cm/s LV PW:         1.30 cm   LV E/e' medial:  9.8 LV IVS:        0.80 cm   LV e' lateral:   5.87 cm/s LVOT diam:     2.00 cm   LV E/e' lateral: 13.3 LV SV:         64 LV SV Index:   29 LVOT Area:     3.14 cm  RIGHT VENTRICLE RV Basal diam:  3.90 cm RV S prime:     15.30 cm/s TAPSE (M-mode): 2.6 cm LEFT ATRIUM             Index        RIGHT ATRIUM           Index LA diam:        3.70 cm 1.66 cm/m   RA Area:     14.60 cm LA Vol (A2C):   46.8 ml 21.05 ml/m  RA Volume:   37.60 ml  16.91 ml/m LA Vol (A4C):   55.1 ml 24.78 ml/m LA Biplane Vol: 53.6 ml 24.11 ml/m  AORTIC VALVE                     PULMONIC VALVE AV Area (Vmax):    2.19 cm      PV Vmax:        1.16 m/s AV Area (Vmean):   1.96 cm      PV Vmean:       71.200 cm/s AV Area (VTI):     2.02 cm      PV VTI:         0.259 m AV Vmax:           145.00 cm/s   PV Peak grad:   5.4 mmHg AV Vmean:          103.450 cm/s  PV Mean grad:   2.0 mmHg AV VTI:            0.317 m       RVOT Peak grad: 9 mmHg AV Peak Grad:      8.4 mmHg AV Mean Grad:      5.0 mmHg LVOT Vmax:         101.00 cm/s LVOT Vmean:        64.700 cm/s LVOT VTI:          0.204 m LVOT/AV VTI ratio: 0.64  AORTA Ao Root diam: 3.10 cm MITRAL VALVE               TRICUSPID VALVE MV Area (PHT): 3.31 cm    TR Peak grad:   17.5 mmHg MV Area VTI:   1.95 cm    TR Vmax:        209.00 cm/s MV Peak grad:  3.9 mmHg MV Mean grad:  2.0 mmHg    SHUNTS MV Vmax:       0.99 m/s    Systemic VTI:  0.20 m MV Vmean:      61.8 cm/s   Systemic Diam: 2.00 cm MV Decel Time: 229 msec    Pulmonic VTI:  0.337 m MV E velocity: 78.00 cm/s MV A velocity: 91.70 cm/s MV E/A ratio:  0.85 Dwayne D Callwood MD  Electronically signed by Yolonda Kida MD Signature Date/Time: 08/08/2021/5:32:25 PM    Final    VAS US DOPPLER PRE CABG  Result Date: 08/10/2021 PREOPERATIVE VASCULAR  EVALUATION Patient Name:  CAMDEN KNOTEK  Date of Exam:   08/09/2021 Medical Rec #: [id number]     Accession #:    [id number] Date of Birth: 08-01-1960     Patient Gender: M Patient Age:   64 years Exam Location[facility] Procedure:      VAS US DOPPLER PRE CABG Referring Phys: Collier Salina Donis Pinder --------------------------------------------------------------------------------  Indications:      Pre-CABG. Risk Factors:     Hypertension, hyperlipidemia, no history of smoking, prior MI,                   coronary artery disease. Comparison Study: No prior studies Performing Technologist: Darlin Coco RDMS, RVT  Examination Guidelines: A complete evaluation includes B-mode imaging, spectral Doppler, color Doppler, and power Doppler as needed of all accessible portions of each vessel. Bilateral testing is considered an integral part of a complete examination. Limited examinations for reoccurring indications may be performed as noted.  Right Carotid Findings: +----------+--------+--------+--------+--------+------------------+           PSV cm/sEDV cm/sStenosisDescribeComments           +----------+--------+--------+--------+--------+------------------+ CCA Prox  130     20                                         +----------+--------+--------+--------+--------+------------------+ CCA Distal108     25                      intimal thickening +----------+--------+--------+--------+--------+------------------+ ICA Prox  102     22      1-39%           intimal thickening +----------+--------+--------+--------+--------+------------------+ ICA Distal104     31                                         +----------+--------+--------+--------+--------+------------------+ ECA       99      15                                          +----------+--------+--------+--------+--------+------------------+ +----------+--------+-------+---------+------------+           PSV cm/sEDV cmsDescribe Arm Pressure +----------+--------+-------+---------+------------+ Subclavian198            Turbulent             +----------+--------+-------+---------+------------+ +---------+--------+--+--------+--+---------+ VertebralPSV cm/s43EDV cm/s13Antegrade +---------+--------+--+--------+--+---------+ Left Carotid Findings: +----------+--------+--------+--------+------------+--------+           PSV cm/sEDV cm/sStenosisDescribe    Comments +----------+--------+--------+--------+------------+--------+ CCA Prox  161     34                                   +----------+--------+--------+--------+------------+--------+ CCA Distal150     31                                   +----------+--------+--------+--------+------------+--------+ ICA Prox  98      29      1-39%   heterogenous         +----------+--------+--------+--------+------------+--------+ ICA Distal106     41                                   +----------+--------+--------+--------+------------+--------+  ECA       167     21                                   +----------+--------+--------+--------+------------+--------+ +----------+--------+--------+---------+------------+ SubclavianPSV cm/sEDV cm/sDescribe Arm Pressure +----------+--------+--------+---------+------------+           201             Turbulent             +----------+--------+--------+---------+------------+ +---------+--------+--+--------+-+---------+ VertebralPSV cm/s49EDV cm/s9Antegrade +---------+--------+--+--------+-+---------+  ABI Findings: +--------+------------------+-----+---------+--------+ Right   Rt Pressure (mmHg)IndexWaveform Comment  +--------+------------------+-----+---------+--------+ Brachial                       triphasic          +--------+------------------+-----+---------+--------+ PTA                            triphasic         +--------+------------------+-----+---------+--------+ DP                             triphasic         +--------+------------------+-----+---------+--------+ +--------+------------------+-----+---------+-------+ Left    Lt Pressure (mmHg)IndexWaveform Comment +--------+------------------+-----+---------+-------+ Brachial                       triphasic        +--------+------------------+-----+---------+-------+ PTA                            triphasic        +--------+------------------+-----+---------+-------+ DP                             triphasic        +--------+------------------+-----+---------+-------+  Right Doppler Findings: +--------+--------+-----+---------+--------+ Site    PressureIndexDoppler  Comments +--------+--------+-----+---------+--------+ Brachial             triphasic         +--------+--------+-----+---------+--------+ Radial               triphasic         +--------+--------+-----+---------+--------+ Ulnar                triphasic         +--------+--------+-----+---------+--------+  Left Doppler Findings: +--------+--------+-----+---------+--------+ Site    PressureIndexDoppler  Comments +--------+--------+-----+---------+--------+ Brachial             triphasic         +--------+--------+-----+---------+--------+ Radial               triphasic         +--------+--------+-----+---------+--------+ Ulnar                triphasic         +--------+--------+-----+---------+--------+  Summary: Right Carotid: Velocities in the right ICA are consistent with a 1-39% stenosis. Left Carotid: Velocities in the left ICA are consistent with a 1-39% stenosis. Vertebrals:  Bilateral vertebral arteries demonstrate antegrade flow. Subclavians: Bilateral subclavian artery flow was disturbed. Right Upper Extremity:  Doppler waveforms remain within normal limits with right radial compression. Doppler waveforms remain within normal limits with right ulnar compression. Left Upper Extremity: Doppler waveforms remain within normal limits with left radial compression. Doppler waveforms remain within normal limits with left  ulnar compression.  Electronically signed by Harold Barban MD on 08/10/2021 at 12:10:21 AM.    Final      Discharge Medications: Allergies as of 08/16/2021       Reactions   Ibuprofen Other (See Comments)   Affects kidneys   Levetiracetam    Other reaction(s): Dizziness, Headache   Nsaids    Affects kidneys        Medication List     STOP taking these medications    heparin 25000 UT/250ML infusion       TAKE these medications    acetaminophen 500 MG tablet Commonly known as: TYLENOL Take 1-2 tablets (500-1,000 mg total) by mouth every 6 (six) hours as needed.   allopurinol 300 MG tablet Commonly known as: ZYLOPRIM Take 300 mg by mouth daily.   amiodarone 200 MG tablet Commonly known as: PACERONE Take 2 tablets (400 mg total) by mouth 2 (two) times daily. X 5 days, then decrease to 200 mg BID   apixaban 5 MG Tabs tablet Commonly known as: ELIQUIS Take 1 tablet (5 mg total) by mouth 2 (two) times daily.   aspirin EC 81 MG tablet Take 81 mg by mouth daily.   atorvastatin 20 MG tablet Commonly known as: LIPITOR Take 20 mg by mouth daily.   clonazePAM 0.5 MG tablet Commonly known as: KLONOPIN 1 TABLET IN THE MORNING, 2 IN THE EVENING What changed: See the new instructions.   hydrocortisone 2.5 % ointment Apply 1 application. topically daily as needed (itching).   Lacosamide 100 MG Tabs Commonly known as: Vimpat Take 1 tablet (100 mg total) by mouth in the morning and at bedtime.   lidocaine-prilocaine cream Commonly known as: EMLA Apply 1 application. topically as needed. Apply small amount of cream to port site approx 1-2 hours prior to appointment.    lisinopril 20 MG tablet Commonly known as: ZESTRIL Take 20 mg by mouth daily.   LORazepam 2 MG tablet Commonly known as: Ativan Take 1 tablet (2 mg total) by mouth every 8 (eight) hours as needed for seizure.   metoprolol succinate 25 MG 24 hr tablet Commonly known as: TOPROL-XL Take 25 mg by mouth daily.   multivitamin with minerals Tabs tablet Take 1 tablet by mouth daily. Centrum Silver   omeprazole 20 MG capsule Commonly known as: PRILOSEC Take 1 capsule (20 mg total) by mouth daily.   ondansetron 4 MG tablet Commonly known as: ZOFRAN TAKE 1 TABLET BY MOUTH EVERY 8 HOURS AS NEEDED FOR NAUSEA AND VOMITING What changed: See the new instructions.   Refresh Optive Mega-3 0.5-1-0.5 % Soln Generic drug: Carboxymeth-Glyc-Polysorb PF Place 1 drop into both eyes daily as needed (dry eyes).   traMADol 50 MG tablet Commonly known as: ULTRAM Take 1 tablet (50 mg total) by mouth every 6 (six) hours as needed for moderate pain.               Durable Medical Equipment  (From admission, onward)           Start     Ordered   08/15/21 1435  For home use only DME Walker rolling  Once       Question Answer Comment  Walker: With 5 Inch Wheels   Patient needs a walker to treat with the following condition Weakness generalized      08/15/21 1434           The patient has been discharged on:   1.Beta Blocker:  Yes [ X  ]  No   [   ]                              If No, reason:  2.Ace Inhibitor/ARB: Yes [ X  ]                                     No  [    ]                                     If No, reason:  3.Statin:   Yes [ X  ]                  No  [   ]                  If No, reason:  4.Ecasa:  Yes  [ X  ]                  No   [   ]                  If No, reason:  Patient had ACS upon admission: NO  Plavix/P2Y12 inhibitor: Yes [   ]                                      No  [  X ]   Follow Up Appointments:  Follow-up  Information     Dr. Prescott Gum. Go on 09/12/2021.   Why: PA/LAT CXR to be taken (at [facility] which is in the same building as Dr. Lucianne Lei Trigt's office) on 06/19 at 2:00 pm ;Appointment time is at 2:30 pm Contact information: [address] # [place], [place], [place] [address] [phone]        [name], MD. Go on 08/29/2021.   Specialty: Cardiology Why: Appointment at 2:15 pm Contact information: [address] [facility] [address] [place], [place], [place]. Call.   Specialty: Family Medicine Why: for a follow up regarding further surveillance of HGA1C 5.7 (pre diabetes) Contact information: [place] [address] [phone]         [facility], [facility] Follow up.   Specialty: Home Health Services Why: HHPT arranged- they will contact you to schedule home visits within 48 hr post discharge Contact information: [place] [address] [phone]         [facility] Follow up.   Why: (Adapt)- rolling walker arranged- to be delivered to room prior to discharge Contact information: [address] [place]  [address] [phone]                 Signed: Ellamae Sia 08/16/2021, 9:58 AM    Agree with above assessment and plan Discharge instructions reviewed with patient patient examined and medical record reviewed,agree with above note. Dahlia Byes 08/16/2021

## 2021-08-10 NOTE — Hospital Course (Addendum)
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  Edward Pitts presented to the [facility] emergency room via EMS on 08/05/2021 after sudden onset of chest pain associated with dizziness and left arm tingling while mowing using a riding lawnmower.  Initial troponin in the emergency room was 23 and later rose to a peak of 272.  EKG showed a paced rhythm with no evidence of acute ischemia.  Chest x-ray was unrevealing.  He was admitted to [facility] hospital by the hospitalist service with acute non-ST elevation myocardial infarction.  He was started on a heparin drip.  His chest pain subsided by the time of his admission.  Dr. Nehemiah Massed of the cardiology service was consulted.  He recommended echocardiography and left heart catheterization.  The echocardiogram showed left ventricular ejection fraction of 65 to 70%, normal RV function, and trivial MR.  The study was otherwise unremarkable.  Left heart catheterization was performed yesterday showing an 80% left main coronary artery stenosis and a 45% lesion in the mid RCA.  Edward Pitts was transferred to [facility] for further evaluation and potential surgical revascularization for his high-grade left main coronary artery stenosis.   Currently, Edward Pitts is resting comfortably in bed with a heparin infusion running.  Denies any chest pain since admission.  He is retired from the [employer] and lives on [employer] and disability.  He lives alone but has a brother in [place] that he said may be able to come  and stay with him for a period of time after discharge.     He is right-hand dominant.  He has chronic edema in his left lower extremity following extensive surgical resection at the left groin level followed by radiation to the site.  Dr. Prescott Gum discussed the need for coronary artery bypass grafting surgery. Potential risks, benefits, and complications were discussed with the patient and he agreed to proceed with surgery. Pre operative carotid US showed no significant internal carotid artery stenosis bilaterally.  Hospital Course: Patient underwent a CABG x 3. He was transferred from the OR to [facility] ICU in stable condition. He was extubated on post op day one. He was weaned off Neo Synephrine and Milrinone drips. [name], a line, and foley were removed early in his post op course. Chest tubes remained for a few days and once output decreased, were removed. He was weaned off the Insulin drip. His pre op HGA1C was 5.7. He likely has pre diabetes. He will need further surveillance of his HGA1C after discharge. We will provide nutrition information with discharge paperwork.  He does have an expected acute blood loss anemia and we are monitoring his hemoglobin and hematocrit.  He does have a chronic renal insufficiency with creatinine ranging in the 1.1-1.5 range preoperatively.  On postop day #4 it was 1.5.  He does have some hypertension but creatinine does limit the ability to resume his ACE inhibitor and he may require an additional agent.  Oxygen has been weaned and he maintains good saturations on room air.  Incisions are healing well without evidence of infection.  He is  tolerating gradually increasing activities using standard cardiac rehab phase 1 modalities.  He has maintained V pacing on his PPM.  The patient's creatinine level rose as high as 2.0, due to this diuretics were stopped.  The patient remains clinically stable.  Surgical incisions are healing without evidence of infection.  Home health  arrangements have been made.  He is medically stable for discharge home today.

## 2021-08-10 NOTE — Progress Notes (Signed)
?PROGRESS NOTE ? ? ? ?Edward Pitts  LFY:[id number] DOB: 12/21/1960 DOA: 08/08/2021 ?PCP: Mechele Claude, FNP ? ? ?Brief Narrative:  ?61 year old white male history of hypertension, hyperlipidemia, seizure disorder, history of complete heart block status post permanent pacemaker who was admitted over the weekend at [facility] regional due to NSTEMI.  Patient had a left heart cath which showed obstructive left main disease with an 80% stenosis. Patient transferred to [facility] for CABG. ?  ?Assessment & Plan: ?  ?Principal Problem: ?  NSTEMI (non-ST elevated myocardial infarction) ([place]) ?Active Problems: ?  HTN (hypertension) ?  HLD (hyperlipidemia) ?  Seizures ([place]) ?  Complete heart block ([place]) ?  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  Coronary artery disease involving native coronary artery of native heart ?  Stage 3a chronic kidney disease ([place]) ?  Port-A-Cath in place ?  S/P placement of cardiac pacemaker ? ? ?NSTEMI/CAD: Patient chest pain-free currently.  CT surgery has seen him and he is CABG scheduled for later this morning.  Management per cardiothoracic surgery. ?  ?Port-A-Cath in place: Stable. ?  ?S/p cardiac pacemaker: For CHB.  Stable. ?  ?CKD stage IIIa: Stable. ?  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 ?  ?Seizure: On Vimpat and Klonopin twice daily.  Stable. ?  ?Essential hypertension: Controlled on current medication which is Toprol-XL. ?  ?Hyperlipidemia: Continue Lipitor. ? ?DVT prophylaxis: Heparin ?Code Status: Full ?Family Communication: None present ? ?Status is: Inpatient ? ?Dispo: The patient is from: Home ?             Anticipated d/c is to: To be determined ?             Anticipated d/c date is: 48 to 72 hours ?             Patient currently not medically stable for discharge ? ?Consultants:  ?CTCSx ? ?Procedures:  ?CABG 08/10/21 ? ?Antimicrobials:  ?None ? ?Subjective: ?No acute issues or events  overnight ? ?Objective: ?Vitals:  ? 08/09/21 1439 08/09/21 1955 08/10/21 0459 08/10/21 0500  ?BP:  138/65 (!) 149/69   ?Pulse:  63 70   ?Resp: '18 18 16   '$ ?Temp: 98.4 ?F (36.9 ?C) 98.6 ?F (37 ?C) 98.3 ?F (36.8 ?C)   ?TempSrc:  Oral Oral   ?SpO2: 92% 95% 97%   ?Weight:    112.3 kg  ?Height:      ? ? ?Intake/Output Summary (Last 24 hours) at 08/10/2021 0713 ?Last data filed at 08/09/2021 2100 ?Gross per 24 hour  ?Intake --  ?Output 1375 ml  ?Net -1375 ml  ? ?Filed Weights  ? 08/08/21 2156 08/09/21 0502 08/10/21 0500  ?Weight: 112.3 kg 112 kg 112.3 kg  ? ? ?Examination: ? ?General:  Pleasantly resting in bed, No acute distress. ?HEENT:  Normocephalic atraumatic.  Sclerae nonicteric, noninjected.  Extraocular movements intact bilaterally. ?Neck:  Without mass or deformity.  Trachea is midline. ?Lungs:  Clear to auscultate bilaterally without rhonchi, wheeze, or rales. ?Heart:  Regular rate and rhythm.  Without murmurs, rubs, or gallops. ?Abdomen:  Soft, nontender, nondistended.  Without guarding or rebound. ?Extremities: Without cyanosis, clubbing, edema, or obvious deformity. ?Vascular:  Dorsalis pedis and posterior tibial pulses palpable bilaterally. ?Skin:  Warm and dry, no erythema, no ulcerations. ? ?Data Reviewed: I have personally reviewed following labs and imaging studies ? ?CBC: ?Recent Labs  ?Lab 08/06/21 ?0500 08/07/21 ?0429 08/08/21 ?8527 08/09/21 ?0109 08/10/21 ?7824  ?  WBC 5.9 5.0 5.7 5.3 5.4  ?NEUTROABS  --   --   --  3.3  --   ?HGB 11.3* 11.1* 11.4* 10.7* 10.7*  ?HCT 34.4* 33.5* 33.8* 31.8* 31.9*  ?MCV 90.3 89.1 88.3 90.6 89.9  ?PLT 198 193 195 191 175  ? ?Basic Metabolic Panel: ?Recent Labs  ?Lab 08/05/21 ?1132 08/06/21 ?0500 08/08/21 ?3007 08/09/21 ?0109 08/10/21 ?6226  ?NA 141 142 142 139 139  ?K 4.1 4.2 4.1 3.8 4.0  ?CL 112* 114* 107 110 107  ?CO2 '24 24 26 24 25  '$ ?GLUCOSE 118* 116* 108* 108* 111*  ?BUN 31* 28* 27* 21 21  ?CREATININE 1.57* 1.37* 1.50* 1.57* 1.53*  ?CALCIUM 9.0 9.5 9.6 8.7* 9.2  ?MG  --    --   --  1.6*  --   ? ?GFR: ?Estimated Creatinine Clearance: 61.7 mL/min (A) (by C-G formula based on SCr of 1.53 mg/dL (H)). ?Liver Function Tests: ?Recent Labs  ?Lab 08/05/21 ?1132 08/09/21 ?0109 08/09/21 ?1046  ?AST 35 40 40  ?ALT 37 56* 61*  ?ALKPHOS 93 86 91  ?BILITOT 0.9 0.6 0.5  ?PROT 7.3 6.4* 6.6  ?ALBUMIN 4.1 3.5 3.6  ? ?No results for input(s): LIPASE, AMYLASE in the last 168 hours. ?No results for input(s): AMMONIA in the last 168 hours. ?Coagulation Profile: ?Recent Labs  ?Lab 08/06/21 ?3335  ?INR 1.1  ? ?Cardiac Enzymes: ?No results for input(s): CKTOTAL, CKMB, CKMBINDEX, TROPONINI in the last 168 hours. ?BNP (last 3 results) ?No results for input(s): PROBNP in the last 8760 hours. ?HbA1C: ?Recent Labs  ?  08/09/21 ?0108  ?HGBA1C 5.7*  ? ?CBG: ?No results for input(s): GLUCAP in the last 168 hours. ?Lipid Profile: ?No results for input(s): CHOL, HDL, LDLCALC, TRIG, CHOLHDL, LDLDIRECT in the last 72 hours. ?Thyroid Function Tests: ?Recent Labs  ?  08/09/21 ?1046  ?TSH 3.342  ? ?Anemia Panel: ?No results for input(s): VITAMINB12, FOLATE, FERRITIN, TIBC, IRON, RETICCTPCT in the last 72 hours. ?Sepsis Labs: ?No results for input(s): PROCALCITON, LATICACIDVEN in the last 168 hours. ? ?Recent Results (from the past 240 hour(s))  ?Surgical pcr screen     Status: None  ? Collection Time: 08/09/21  2:49 PM  ? Specimen: Nasal Mucosa; Nasal Swab  ?Result Value Ref Range Status  ? MRSA, PCR NEGATIVE NEGATIVE Final  ? Staphylococcus aureus NEGATIVE NEGATIVE Final  ?  Comment: (NOTE) ?The Xpert SA Assay (FDA approved for NASAL specimens in patients 61 ?years of age and older), is one component of a comprehensive ?surveillance program. It is not intended to diagnose infection nor to ?guide or monitor treatment. ?Performed at [facility], [place] [address], [place], [place] ?[address] ?  ?Resp Panel by RT-PCR (Flu A&B, Covid) Nasopharyngeal Swab     Status: None  ? Collection Time: 08/09/21  7:49 PM  ? Specimen:  Nasopharyngeal Swab; Nasopharyngeal(NP) swabs in vial transport medium  ?Result Value Ref Range Status  ? SARS Coronavirus 2 by RT PCR NEGATIVE NEGATIVE Final  ?  Comment: (NOTE) ?SARS-CoV-2 target nucleic acids are NOT DETECTED. ? ?The SARS-CoV-2 RNA is generally detectable in upper respiratory ?specimens during the acute phase of infection. The lowest ?concentration of SARS-CoV-2 viral copies this assay can detect is ?138 copies/mL. A negative result does not preclude SARS-Cov-2 ?infection and should not be used as the sole basis for treatment or ?other patient management decisions. A negative result may occur with  ?improper specimen collection/handling, submission of specimen other ?than nasopharyngeal swab,  presence of viral mutation(s) within the ?areas targeted by this assay, and inadequate number of viral ?copies(<138 copies/mL). A negative result must be combined with ?clinical observations, patient history, and epidemiological ?information. The expected result is Negative. ? ?Fact Sheet for Patients:  ?EntrepreneurPulse.com.au ? ?Fact Sheet for Healthcare Providers:  ?IncredibleEmployment.be ? ?This test is no t yet approved or cleared by the [place] FDA and  ?has been authorized for detection and/or diagnosis of SARS-CoV-2 by ?FDA under an Emergency Use Authorization (EUA). This EUA will remain  ?in effect (meaning this test can be used) for the duration of the ?COVID-19 declaration under Section 564(b)(1) of the Act, 21 ?U.S.C.section 360bbb-3(b)(1), unless the authorization is terminated  ?or revoked sooner.  ? ? ?  ? Influenza A by PCR NEGATIVE NEGATIVE Final  ? Influenza B by PCR NEGATIVE NEGATIVE Final  ?  Comment: (NOTE) ?The Xpert Xpress SARS-CoV-2/FLU/RSV plus assay is intended as an aid ?in the diagnosis of influenza from Nasopharyngeal swab specimens and ?should not be used as a sole basis for treatment. Nasal washings and ?aspirates are unacceptable for  Xpert Xpress SARS-CoV-2/FLU/RSV ?testing. ? ?Fact Sheet for Patients: ?EntrepreneurPulse.com.au ? ?Fact Sheet for Healthcare Providers: ?IncredibleEmployment.be ? ?This test is not yet approved

## 2021-08-10 NOTE — Progress Notes (Signed)
Pre Procedure note for inpatients: ?  ?Edward Pitts has been scheduled for Procedure(s): ?CORONARY ARTERY BYPASS GRAFTING (CABG) (N/A) ?TRANSESOPHAGEAL ECHOCARDIOGRAM (TEE) (N/A) today. The various methods of treatment have been discussed with the patient. After consideration of the risks, benefits and treatment options the patient has consented to the planned procedure.  ? ?The patient has been seen and labs reviewed. There are no changes in the patient?s condition to prevent proceeding with the planned procedure today. ? ?Recent labs: ? ?Lab Results  ?Component Value Date  ? WBC 5.4 08/10/2021  ? HGB 10.7 (L) 08/10/2021  ? HCT 31.9 (L) 08/10/2021  ? PLT 175 08/10/2021  ? GLUCOSE 111 (H) 08/10/2021  ? CHOL 156 08/06/2021  ? TRIG 153 (H) 08/06/2021  ? HDL 34 (L) 08/06/2021  ? [place] 91 08/06/2021  ? ALT 61 (H) 08/09/2021  ? AST 40 08/09/2021  ? NA 139 08/10/2021  ? K 4.0 08/10/2021  ? CL 107 08/10/2021  ? CREATININE 1.53 (H) 08/10/2021  ? BUN 21 08/10/2021  ? CO2 25 08/10/2021  ? TSH 3.342 08/09/2021  ? INR 1.1 08/06/2021  ? HGBA1C 5.7 (H) 08/09/2021  ? ? ?Dahlia Byes, MD ?08/10/2021 8:21 AM ? ? ?   ?

## 2021-08-10 NOTE — Anesthesia Preprocedure Evaluation (Addendum)
Anesthesia Evaluation  ?Patient identified by MRN, date of birth, ID band ?Patient awake ? ? ? ?Reviewed: ?Allergy & Precautions, NPO status , Patient's Chart, lab work & pertinent test results ? ?Airway ?Mallampati: III ? ?TM Distance: >3 FB ?Neck ROM: Full ? ? ? Dental ?no notable dental hx. ? ?  ?Pulmonary ?sleep apnea and Continuous Positive Airway Pressure Ventilation ,  ?  ?Pulmonary exam normal ? ? ? ? ? ? ? Cardiovascular ?hypertension, Pt. on medications and Pt. on home beta blockers ?+ CAD  ?Normal cardiovascular exam+ pacemaker  ? ?ECHO: ?Left ventricular ejection fraction, by estimation, is 65 to 70%. The left ventricle has ?normal function. The left ventricle has no regional wall motion abnormalities. Left ?ventricular diastolic parameters are consistent with Grade I diastolic dysfunction (impaired relaxation). ?Right ventricular systolic function is normal. The right ventricular size is normal. ?The mitral valve is normal in structure. Trivial mitral valve regurgitation. ?The aortic valve is normal in structure. Aortic valve regurgitation is not visualized. ?  ?Neuro/Psych ?Seizures -,  Anxiety   ? GI/Hepatic ?Neg liver ROS, GERD  Medicated and Controlled,  ?Endo/Other  ?negative endocrine ROS ? Renal/GU ?Renal disease  ? ?  ?Musculoskeletal ? ?(+) Arthritis ,  ? Abdominal ?(+) + obese,   ?Peds ? Hematology ? ?(+) Blood dyscrasia, anemia , Port-A-Cath in place   ?Anesthesia Other Findings ?CAD ? Reproductive/Obstetrics ? ?  ? ? ? ? ? ? ? ? ? ? ? ? ? ?  ?  ? ? ? ? ? ? ? ?Anesthesia Physical ?Anesthesia Plan ? ?ASA: 4 ? ?Anesthesia Plan: General  ? ?Post-op Pain Management:   ? ?Induction: Intravenous ? ?PONV Risk Score and Plan: 2 and Ondansetron, Dexamethasone, Midazolam and Treatment may vary due to age or medical condition ? ?Airway Management Planned: Oral ETT ? ?Additional Equipment: Arterial line, CVP, PA Cath, TEE and Ultrasound Guidance Line  Placement ? ?Intra-op Plan:  ? ?Post-operative Plan: Post-operative intubation/ventilation ? ?Informed Consent: I have reviewed the patients History and Physical, chart, labs and discussed the procedure including the risks, benefits and alternatives for the proposed anesthesia with the patient or authorized representative who has indicated his/her understanding and acceptance.  ? ? ? ?Dental advisory given ? ?Plan Discussed with: CRNA ? ?Anesthesia Plan Comments:   ? ? ? ? ? ?Anesthesia Quick Evaluation ? ?

## 2021-08-10 NOTE — Progress Notes (Signed)
Patient ID: Edward Pitts, male   DOB: June 23, 1960, 61 y.o.   MRN: [id number] ? ?TCTS Evening Rounds:  ? ?Hemodynamically stable  ?CI = 2.6 ? ?Asleep on vent. Had to change out ETT due to cuff leak. Nurse reports plan is to keep pt on vent overnight. ? ?Urine output good  ?CT output low ? ?CBC ?   ?Component Value Date/Time  ? WBC 5.4 08/10/2021 0618  ? RBC 3.55 (L) 08/10/2021 0618  ? HGB 9.9 (L) 08/10/2021 1408  ? HCT 29.0 (L) 08/10/2021 1408  ? PLT 195 08/10/2021 1259  ? MCV 89.9 08/10/2021 0618  ? MCH 30.1 08/10/2021 0618  ? MCHC 33.5 08/10/2021 0618  ? RDW 13.2 08/10/2021 0618  ? LYMPHSABS 1.2 08/09/2021 0109  ? MONOABS 0.6 08/09/2021 0109  ? EOSABS 0.2 08/09/2021 0109  ? BASOSABS 0.0 08/09/2021 0109  ? ? ? ?BMET ?   ?Component Value Date/Time  ? NA 135 08/10/2021 1408  ? K 5.7 (H) 08/10/2021 1408  ? CL 106 08/10/2021 1408  ? CO2 25 08/10/2021 0618  ? GLUCOSE 156 (H) 08/10/2021 1408  ? BUN 19 08/10/2021 1408  ? CREATININE 1.10 08/10/2021 1408  ? CALCIUM 9.2 08/10/2021 0618  ? GFRNONAA 51 (L) 08/10/2021 0618  ? ? ? ?A/P:  Stable postop course. Continue current plans ? ?

## 2021-08-10 NOTE — Transfer of Care (Signed)
Immediate Anesthesia Transfer of Care Note ? ?Patient: Edward Pitts ? ?Procedure(s) Performed: CORONARY ARTERY BYPASS GRAFTING (CABG) X 3 USING LEFT INTERNAL MAMMARY ARTERY AND RIGHT GREATER SAPHENOUS VEIN (Chest) ?TRANSESOPHAGEAL ECHOCARDIOGRAM (TEE) ?ENDOVEIN HARVEST OF GREATER SAPHENOUS VEIN (Right) ? ?Patient Location: SICU ? ?Anesthesia Type:General ? ?Level of Consciousness: Patient remains intubated per anesthesia plan ? ?Airway & Oxygen Therapy: Patient remains intubated per anesthesia plan and Patient placed on Ventilator (see vital sign flow sheet for setting) ? ?Post-op Assessment: Report given to RN and Post -op Vital signs reviewed and stable ? ?Post vital signs: Reviewed and stable ? ?Last Vitals:  ?Vitals Value Taken Time  ?BP    ?Temp 35.5 ?C 08/10/21 1615  ?Pulse 88 08/10/21 1615  ?Resp 14 08/10/21 1615  ?SpO2 95 % 08/10/21 1615  ?Vitals shown include unvalidated device data. ? ?Last Pain:  ?Vitals:  ? 08/10/21 0459  ?TempSrc: Oral  ?PainSc:   ?   ? ?Patients Stated Pain Goal: 0 (08/09/21 2150) ? ?Complications: No notable events documented. ?

## 2021-08-10 NOTE — Anesthesia Procedure Notes (Signed)
Arterial Line Insertion ?Start/End5/17/2023 7:10 AM, 08/10/2021 7:15 AM ?Performed by: Thelma Comp, CRNA, CRNA ? Patient location: Pre-op. ?Preanesthetic checklist: patient identified, IV checked, site marked, risks and benefits discussed, surgical consent, monitors and equipment checked, pre-op evaluation, timeout performed and anesthesia consent ?Lidocaine 1% used for infiltration ?Right, radial was placed ?Catheter size: 20 G ?Hand hygiene performed  and maximum sterile barriers used  ? ?Attempts: 1 ?Procedure performed without using ultrasound guided technique. ?Following insertion, dressing applied and Biopatch. ?Post procedure assessment: normal and unchanged ? ?Patient tolerated the procedure well with no immediate complications. ?Additional procedure comments: Performed by Marybelle Killings. ? ? ? ?

## 2021-08-10 NOTE — Anesthesia Procedure Notes (Signed)
Central Venous Catheter Insertion ?Performed by: Murvin Natal, MD, anesthesiologist ?Start/End5/17/2023 8:00 AM, 08/10/2021 8:20 AM ?Patient location: Pre-op. ?Preanesthetic checklist: patient identified, IV checked, site marked, risks and benefits discussed, surgical consent, monitors and equipment checked, pre-op evaluation, timeout performed and anesthesia consent ?Position: Trendelenburg ?Lidocaine 1% used for infiltration and patient sedated ?Hand hygiene performed , maximum sterile barriers used  and Seldinger technique used ?Catheter size: 8.5 Fr ?Total catheter length 10. ?PA cath was placed.Sheath introducer ?PA Cath depth:50 ?Procedure performed using ultrasound guided technique. ?Ultrasound Notes:anatomy identified, needle tip was noted to be adjacent to the nerve/plexus identified, no ultrasound evidence of intravascular and/or intraneural injection and image(s) printed for medical record ?Attempts: 1 ?Following insertion, line sutured, dressing applied and Biopatch. ?Post procedure assessment: blood return through all ports, free fluid flow and no air ? ?Patient tolerated the procedure well with no immediate complications. ? ? ? ? ? ? ?

## 2021-08-11 ENCOUNTER — Inpatient Hospital Stay (HOSPITAL_COMMUNITY)

## 2021-08-11 ENCOUNTER — Encounter (HOSPITAL_COMMUNITY): Payer: Self-pay | Admitting: Cardiothoracic Surgery

## 2021-08-11 LAB — GLUCOSE, CAPILLARY
Glucose-Capillary: 129 mg/dL — ABNORMAL HIGH (ref 70–99)
Glucose-Capillary: 135 mg/dL — ABNORMAL HIGH (ref 70–99)
Glucose-Capillary: 142 mg/dL — ABNORMAL HIGH (ref 70–99)
Glucose-Capillary: 151 mg/dL — ABNORMAL HIGH (ref 70–99)
Glucose-Capillary: 154 mg/dL — ABNORMAL HIGH (ref 70–99)
Glucose-Capillary: 156 mg/dL — ABNORMAL HIGH (ref 70–99)
Glucose-Capillary: 157 mg/dL — ABNORMAL HIGH (ref 70–99)
Glucose-Capillary: 161 mg/dL — ABNORMAL HIGH (ref 70–99)
Glucose-Capillary: 164 mg/dL — ABNORMAL HIGH (ref 70–99)
Glucose-Capillary: 167 mg/dL — ABNORMAL HIGH (ref 70–99)
Glucose-Capillary: 168 mg/dL — ABNORMAL HIGH (ref 70–99)
Glucose-Capillary: 173 mg/dL — ABNORMAL HIGH (ref 70–99)
Glucose-Capillary: 176 mg/dL — ABNORMAL HIGH (ref 70–99)
Glucose-Capillary: 176 mg/dL — ABNORMAL HIGH (ref 70–99)
Glucose-Capillary: 178 mg/dL — ABNORMAL HIGH (ref 70–99)
Glucose-Capillary: 180 mg/dL — ABNORMAL HIGH (ref 70–99)

## 2021-08-11 LAB — POCT I-STAT 7, (LYTES, BLD GAS, ICA,H+H)
Acid-base deficit: 8 mmol/L — ABNORMAL HIGH (ref 0.0–2.0)
Bicarbonate: 16 mmol/L — ABNORMAL LOW (ref 20.0–28.0)
Calcium, Ion: 1.26 mmol/L (ref 1.15–1.40)
HCT: 31 % — ABNORMAL LOW (ref 39.0–52.0)
Hemoglobin: 10.5 g/dL — ABNORMAL LOW (ref 13.0–17.0)
O2 Saturation: 91 %
Potassium: 3.6 mmol/L (ref 3.5–5.1)
Sodium: 139 mmol/L (ref 135–145)
TCO2: 17 mmol/L — ABNORMAL LOW (ref 22–32)
pCO2 arterial: 27.6 mmHg — ABNORMAL LOW (ref 32–48)
pH, Arterial: 7.37 (ref 7.35–7.45)
pO2, Arterial: 63 mmHg — ABNORMAL LOW (ref 83–108)

## 2021-08-11 LAB — PREPARE FRESH FROZEN PLASMA
Unit division: 0
Unit division: 0

## 2021-08-11 LAB — CBC
HCT: 30.5 % — ABNORMAL LOW (ref 39.0–52.0)
HCT: 30.5 % — ABNORMAL LOW (ref 39.0–52.0)
Hemoglobin: 10.4 g/dL — ABNORMAL LOW (ref 13.0–17.0)
Hemoglobin: 10.3 g/dL — ABNORMAL LOW (ref 13.0–17.0)
MCH: 30.2 pg (ref 26.0–34.0)
MCH: 30.1 pg (ref 26.0–34.0)
MCHC: 34.1 g/dL (ref 30.0–36.0)
MCHC: 33.8 g/dL (ref 30.0–36.0)
MCV: 88.7 fL (ref 80.0–100.0)
MCV: 89.2 fL (ref 80.0–100.0)
Platelets: 136 10*3/uL — ABNORMAL LOW (ref 150–400)
Platelets: 197 10*3/uL (ref 150–400)
RBC: 3.44 MIL/uL — ABNORMAL LOW (ref 4.22–5.81)
RBC: 3.42 MIL/uL — ABNORMAL LOW (ref 4.22–5.81)
RDW: 13.6 % (ref 11.5–15.5)
RDW: 14.1 % (ref 11.5–15.5)
WBC: 9.4 10*3/uL (ref 4.0–10.5)
WBC: 17 10*3/uL — ABNORMAL HIGH (ref 4.0–10.5)
nRBC: 0 % (ref 0.0–0.2)
nRBC: 0 % (ref 0.0–0.2)

## 2021-08-11 LAB — BASIC METABOLIC PANEL
Anion gap: 9 (ref 5–15)
Anion gap: 9 (ref 5–15)
BUN: 20 mg/dL (ref 8–23)
BUN: 29 mg/dL — ABNORMAL HIGH (ref 8–23)
CO2: 20 mmol/L — ABNORMAL LOW (ref 22–32)
CO2: 21 mmol/L — ABNORMAL LOW (ref 22–32)
Calcium: 8.8 mg/dL — ABNORMAL LOW (ref 8.9–10.3)
Calcium: 9.1 mg/dL (ref 8.9–10.3)
Chloride: 109 mmol/L (ref 98–111)
Chloride: 106 mmol/L (ref 98–111)
Creatinine, Ser: 1.65 mg/dL — ABNORMAL HIGH (ref 0.61–1.24)
Creatinine, Ser: 2.04 mg/dL — ABNORMAL HIGH (ref 0.61–1.24)
GFR, Estimated: 47 mL/min — ABNORMAL LOW (ref >60)
GFR, Estimated: 36 mL/min — ABNORMAL LOW (ref >60)
Glucose, Bld: 158 mg/dL — ABNORMAL HIGH (ref 70–99)
Glucose, Bld: 167 mg/dL — ABNORMAL HIGH (ref 70–99)
Potassium: 3.9 mmol/L (ref 3.5–5.1)
Potassium: 4.2 mmol/L (ref 3.5–5.1)
Sodium: 138 mmol/L (ref 135–145)
Sodium: 136 mmol/L (ref 135–145)

## 2021-08-11 LAB — TYPE AND SCREEN
ABO/RH(D): A POS
Antibody Screen: NEGATIVE
Unit division: 0
Unit division: 0

## 2021-08-11 LAB — BPAM FFP
Blood Product Expiration Date: 202305212359
Blood Product Expiration Date: 202305212359
ISSUE DATE / TIME: 202305171333
ISSUE DATE / TIME: 202305171333
Unit Type and Rh: 600
Unit Type and Rh: 6200

## 2021-08-11 LAB — BPAM RBC
Blood Product Expiration Date: 202306072359
Blood Product Expiration Date: 202306082359
ISSUE DATE / TIME: 202305170957
ISSUE DATE / TIME: 202305170957
Unit Type and Rh: 6200
Unit Type and Rh: 6200

## 2021-08-11 LAB — COOXEMETRY PANEL
Carboxyhemoglobin: 1.5 % (ref 0.5–1.5)
Methemoglobin: <0.7 % (ref 0.0–1.5)
O2 Saturation: 72.9 %
Total hemoglobin: 11.7 g/dL — ABNORMAL LOW (ref 12.0–16.0)

## 2021-08-11 LAB — MAGNESIUM
Magnesium: 1.9 mg/dL (ref 1.7–2.4)
Magnesium: 2 mg/dL (ref 1.7–2.4)

## 2021-08-11 MED ORDER — MIDAZOLAM HCL 2 MG/2ML IJ SOLN: 2.0000 mg | INTRAMUSCULAR | Status: DC | PRN

## 2021-08-11 MED ORDER — GUAIFENESIN ER 600 MG PO TB12
600.0000 mg | ORAL_TABLET | Freq: Two times a day (BID) | ORAL | Status: DC
  Administered 2021-08-11 – 2021-08-16 (×11): 600 mg via ORAL
  Filled 2021-08-11 (×11): qty 1

## 2021-08-11 MED ORDER — INSULIN DETEMIR 100 UNIT/ML ~~LOC~~ SOLN
25.0000 [IU] | Freq: Two times a day (BID) | SUBCUTANEOUS | Status: DC
  Filled 2021-08-11 (×2): qty 0.25

## 2021-08-11 MED ORDER — FUROSEMIDE 10 MG/ML IJ SOLN
40.0000 mg | Freq: Two times a day (BID) | INTRAMUSCULAR | Status: DC
  Administered 2021-08-11 (×2): 40 mg via INTRAVENOUS
  Filled 2021-08-11 (×2): qty 4

## 2021-08-11 MED ORDER — BUDESONIDE 0.5 MG/2ML IN SUSP
0.5000 mg | Freq: Two times a day (BID) | RESPIRATORY_TRACT | Status: DC
Start: 2021-08-11 — End: 2021-08-16
  Administered 2021-08-11 – 2021-08-16 (×9): 0.5 mg via RESPIRATORY_TRACT
  Filled 2021-08-11 (×10): qty 2

## 2021-08-11 MED ORDER — LIDOCAINE 5 % EX PTCH
2.0000 | MEDICATED_PATCH | CUTANEOUS | Status: DC
  Administered 2021-08-11 – 2021-08-14 (×4): 2 via TRANSDERMAL
  Filled 2021-08-11 (×4): qty 2

## 2021-08-11 MED ORDER — INSULIN DETEMIR 100 UNIT/ML ~~LOC~~ SOLN
15.0000 [IU] | Freq: Two times a day (BID) | SUBCUTANEOUS | Status: DC
  Administered 2021-08-11 – 2021-08-12 (×4): 15 [IU] via SUBCUTANEOUS
  Filled 2021-08-11 (×6): qty 0.15

## 2021-08-11 MED ORDER — MORPHINE SULFATE (PF) 2 MG/ML IV SOLN
1.0000 mg | INTRAVENOUS | Status: DC | PRN
  Administered 2021-08-11: 2 mg via INTRAVENOUS
  Filled 2021-08-11: qty 1

## 2021-08-11 MED ORDER — INSULIN ASPART 100 UNIT/ML IJ SOLN: 0.0000 [IU] | Freq: Three times a day (TID) | INTRAMUSCULAR | Status: DC

## 2021-08-11 MED ORDER — ENOXAPARIN SODIUM 40 MG/0.4ML IJ SOSY
40.0000 mg | PREFILLED_SYRINGE | INTRAMUSCULAR | Status: DC
  Administered 2021-08-11 – 2021-08-15 (×5): 40 mg via SUBCUTANEOUS
  Filled 2021-08-11 (×5): qty 0.4

## 2021-08-11 MED ORDER — ASPIRIN 325 MG PO TBEC
325.0000 mg | DELAYED_RELEASE_TABLET | Freq: Every day | ORAL | Status: DC
  Administered 2021-08-11 – 2021-08-14 (×4): 325 mg via ORAL
  Filled 2021-08-11 (×4): qty 1

## 2021-08-11 MED ORDER — INSULIN ASPART 100 UNIT/ML IJ SOLN: 0.0000 [IU] | Freq: Every day | INTRAMUSCULAR | Status: DC

## 2021-08-11 MED ORDER — METHYLPREDNISOLONE SODIUM SUCC 40 MG IJ SOLR
40.0000 mg | Freq: Two times a day (BID) | INTRAMUSCULAR | Status: AC
  Administered 2021-08-11 – 2021-08-12 (×2): 40 mg via INTRAVENOUS
  Filled 2021-08-11 (×2): qty 1

## 2021-08-11 MED ORDER — ASPIRIN 81 MG PO CHEW: 324.0000 mg | CHEWABLE_TABLET | Freq: Every day | ORAL | Status: DC

## 2021-08-11 MED ORDER — INSULIN ASPART 100 UNIT/ML IJ SOLN: 4.0000 [IU] | Freq: Three times a day (TID) | INTRAMUSCULAR | Status: DC

## 2021-08-11 MED ORDER — INSULIN ASPART 100 UNIT/ML IJ SOLN
0.0000 [IU] | INTRAMUSCULAR | Status: DC
  Administered 2021-08-11 (×2): 3 [IU] via SUBCUTANEOUS
  Administered 2021-08-11: 4 [IU] via SUBCUTANEOUS
  Administered 2021-08-12: 3 [IU] via SUBCUTANEOUS
  Administered 2021-08-12: 4 [IU] via SUBCUTANEOUS

## 2021-08-11 MED ORDER — METHYLPREDNISOLONE SODIUM SUCC 125 MG IJ SOLR: 80.0000 mg | Freq: Two times a day (BID) | INTRAMUSCULAR | Status: DC

## 2021-08-11 NOTE — Op Note (Signed)
NAMERUDOLPH, DAOUST MEDICAL RECORD NO: [id number] ACCOUNT NO: [id number] DATE OF BIRTH: [birth year] FACILITY: [facility] LOCATION: [facility] PHYSICIAN: Ivin Poot III, MD  Operative Report   DATE OF PROCEDURE: 08/10/2021   OPERATION PERFORMED:   1.  Coronary artery bypass grafting x3 (left internal mammary artery to LAD, saphenous vein graft to circumflex marginal, saphenous vein graft to RCA). 2.  Endoscopic harvest of right leg greater saphenous vein.  PREOPERATIVE DIAGNOSIS:   1.  Unstable angina with non-ST elevation MI and severe left main stenosis. 2.  History of metastatic melanoma with active immunotherapy injections every 2 weeks.  POSTOPERATIVE DIAGNOSIS:   1.  Unstable angina with non-ST elevation MI and severe left main stenosis. 2.  History of metastatic melanoma with active immunotherapy injections every 2 weeks.  SURGEON:  Len Childs, MD  ASSISTANT:  Lars Pinks, PA-C.  A surgical first assistant was required for the surgery in order to provide standard surgical care for cardiac surgery and due to the complexity of the operation.  The first surgical assistant was needed to harvest the vein conduit using endoscopic  technique, the assistant was needed to close the leg incisions and also to assist with the distal anastomosis of the coronary vessels, specifically assisting with suture management, suctioning, exposure, and general assistance.  ANESTHESIA:  General by Dr. Roanna Banning.  Findings: Adequate conduit and target vessels Generalized soft tissue edema and hyperplastic lymphatic tissue , especially the internal mammary nodes from the t -cell activation from nivolumab immunotherapy  DESCRIPTION OF PROCEDURE:  The patient was brought from preoperative holding where informed consent was documented and surgical issues were addressed with the patient in a face-to-face encounter.  The patient was placed supine on the operating room table and  general anesthesia  was induced.  The patient remained stable.  An attempt at placement of the transesophageal echo was unsuccessful due to the patient's difficult airway and short neck.  The patient was then prepped and draped as a sterile field.  A proper timeout was performed.  A sternal incision was made as the saphenous vein was harvested endoscopically from the right leg.  The left internal mammary artery was harvested as a pedicle  graft from its origin at the subclavian vessels.  This was difficult because of the enlarged lymph nodes in the edematous, indurated tissue in the chest, probably related to the multiple immunotherapy injections the patient has been receiving the past 2  years with generalized activation of his lymphatic system.  Mammary artery was 1.5 mm with excellent flow.  The sternal retractor was placed using the deep blades because of the patient's obese body habitus.  The pericardium was opened and suspended.  Pursestrings were placed in the ascending aorta and right atrium and after heparin was administered and ACT  was documented as being therapeutic, the patient was cannulated and placed on cardiopulmonary bypass.  The coronaries were identified for grafting.  The mammary artery and vein grafts were prepared for the distal anastomosis.  Cardioplegia cannulas were  placed both antegrade and retrograde, cold blood cardioplegia.  The patient was cooled to 32 degrees.  Aortic crossclamp was applied.  One liter of cold blood cardioplegia was delivered in split doses between the antegrade aortic and retrograde coronary  sinus catheters.  There was good cardioplegic arrest and septal temperature dropped less than 12 degrees.  Cardioplegia was delivered every 20 minutes while the crossclamp was in place.   The distal coronary anastomoses were performed.  The  first distal anastomosis was to the RCA.  This had a mid vessel 55% stenosis.  A reverse saphenous vein was sewn end-to-side with running 7-0  Prolene with good flow through the graft.  Cardioplegia was  redosed.  The second distal anastomosis was to the OM branch of the left circumflex.  This was a 1.5 mm vessel with proximal 80-90% left main stenosis.  A reverse saphenous vein was sewn end-to-side with running 7-0 Prolene with good flow through the graft.   Cardioplegia was redosed.  The third distal anastomosis was to the distal third of the LAD.  The left internal mammary artery pedicle was brought through an opening in the left lateral pericardium was brought down onto the LAD and sewn end-to-side with running 8-0 Prolene.  There  was good flow through the anastomosis after briefly releasing the pedicle bulldog.  The bulldog was then reapplied and the pedicle was secured to epicardium with 6-0 Prolenes.  Cardioplegia was redosed.  While the cross-clamp was still in place, 2 proximal vein anastomoses were performed on the ascending aorta using a 4.5 mm punch and running 6-0 Prolenes.  Prior to tying down the final proximal anastomosis, air was vented from the coronaries in the left  side of the heart using the usual de-airing maneuvers and a dose of retrograde warm blood cardioplegia.  The crossclamp was removed.  The heart resumed a spontaneous rhythm.  The vein grafts were de-aired and opened.  Each had good flow and hemostasis was documented at the proximal and distal anastomoses.  The patient was rewarmed and reperfused.  Temporary pacing wires were applied.  The lungs were expanded and the ventilator was resumed.  The patient was very stable and ready to be weaned from cardiopulmonary bypass.  The lungs were expanded and  ventilator was resumed and the heart was filled and the patient transitioned off cardiopulmonary bypass without difficulty without any inotropes.  Hemodynamics were stable and cardiac output was 5.2 liters per minute.  Protamine was administered without  adverse reaction.  The cannulas were removed.  There was  still diffuse coagulopathy after reversal of heparin, probably related to the patient's systemic immunotherapy for his metastatic melanoma and generalized inflation.  Two units of FFP were  administered with improvement in coagulation function.  The pericardial fat was closed over the aorta and vein grafts.  Anterior mediastinal, posterior mediastinal and left pleural tubes were placed, brought out through separate incisions.  The sternal wires were placed and the sternum was closed.  The  patient remained stable.  The pectoralis fascia was closed with a running #1 Vicryl.  The subcutaneous and skin layers were closed with a running Vicryl and sterile dressings were applied.  Total cardiopulmonary bypass time was 123 minutes.  The patient  returned to the ICU in stable condition.     SUJ D: 08/10/2021 5:25:37 pm T: 08/11/2021 3:16:00 am  JOB: [id number]/ [id number]

## 2021-08-11 NOTE — Procedures (Signed)
Extubation Procedure Note  Patient Details:   Name: Edward Pitts DOB: [birth year] MRN: [id number]   Airway Documentation:    Vent end date: 08/11/21 Vent end time: 0852   Evaluation  O2 sats: stable throughout Complications: No apparent complications Patient did tolerate procedure well. Bilateral Breath Sounds: Clear, Diminished   Yes, pt could speak post extubation.  Pt extubated to 6 l/m [place].  Earney Navy 08/11/2021, 8:53 AM

## 2021-08-11 NOTE — Progress Notes (Addendum)
[facility] DAILY ICU PROGRESS NOTE                   [address]            [place] [address]          [phone]   1 Day Post-Op Procedure(s) (LRB): CORONARY ARTERY BYPASS GRAFTING (CABG) X 3 USING LEFT INTERNAL MAMMARY ARTERY AND RIGHT GREATER SAPHENOUS VEIN (N/A) TRANSESOPHAGEAL ECHOCARDIOGRAM (TEE) (N/A) ENDOVEIN HARVEST OF GREATER SAPHENOUS VEIN (Right)  Total Length of Stay:  LOS: 3 days   Subjective: Patient awake, alert on vent this am.  Objective: Vital signs in last 24 hours: Temp:  [95.5 F (35.3 C)-98.8 F (37.1 C)] 98.8 F (37.1 C) (05/18 0745) Pulse Rate:  [59-88] 88 (05/18 0745) Cardiac Rhythm: A-V Sequential paced (05/18 0400) Resp:  [12-29] 22 (05/18 0745) BP: (95-133)/(54-92) 126/70 (05/18 0730) SpO2:  [92 %-100 %] 94 % (05/18 0745) Arterial Line BP: (90-136)/(44-92) 136/64 (05/17 2200) FiO2 (%):  [50 %] 50 % (05/18 0735) Weight:  [116.9 kg] 116.9 kg (05/18 0500)  Filed Weights   08/09/21 0502 08/10/21 0500 08/11/21 0500  Weight: 112 kg 112.3 kg 116.9 kg    Weight change: 4.635 kg   Hemodynamic parameters for last 24 hours: PAP: (20-331)/(-19-317) 29/19 CO:  [5.1 L/min-7.9 L/min] 7.9 L/min CI:  [2.3 L/min/m2-3.6 L/min/m2] 3.6 L/min/m2  Intake/Output from previous day: 05/17 0701 - 05/18 0700 In: 5732.5 [I.V.:2991.5; Blood:1591; IV Piggyback:1150] Out: 3800 [Urine:2800; Blood:710; Chest Tube:290]  Intake/Output this shift: No intake/output data recorded.  Current Meds: Scheduled Meds:  acetaminophen  1,000 mg Oral Q6H   Or   acetaminophen (TYLENOL) oral liquid 160 mg/5 mL  1,000 mg Per Tube Q6H   allopurinol  300 mg Oral Daily   aspirin EC  325 mg Oral Daily   Or   aspirin  324 mg Per Tube Daily   atorvastatin  20 mg Oral Daily   bisacodyl  10 mg Oral Daily   Or   bisacodyl  10 mg Rectal Daily   chlorhexidine gluconate (MEDLINE KIT)  15 mL Mouth Rinse BID   Chlorhexidine Gluconate Cloth  6 each Topical Daily    clonazePAM  0.5 mg Oral Daily   clonazePAM  1 mg Oral QHS   docusate sodium  200 mg Oral Daily   ipratropium-albuterol  3 mL Nebulization Q6H   lacosamide  100 mg Oral BID   magnesium gluconate  500 mg Oral Daily   mouth rinse  15 mL Mouth Rinse 10 times per day   methylPREDNISolone (SOLU-MEDROL) injection  80 mg Intravenous Q12H   metoCLOPramide (REGLAN) injection  10 mg Intravenous Q6H   metoprolol tartrate  12.5 mg Oral BID   Or   metoprolol tartrate  12.5 mg Per Tube BID   [START ON 08/12/2021] pantoprazole  40 mg Oral Daily   sodium chloride flush  10-40 mL Intracatheter Q12H   sodium chloride flush  3 mL Intravenous Q12H   Continuous Infusions:  sodium chloride 20 mL/hr at 08/11/21 0700   sodium chloride     sodium chloride 10 mL/hr at 08/10/21 1930   albumin human 12.5 g (08/10/21 1730)    ceFAZolin (ANCEF) IV Stopped (08/11/21 0539)   dexmedetomidine (PRECEDEX) IV infusion 0.7 mcg/kg/hr (08/11/21 0200)   insulin 11 Units/hr (08/11/21 0743)   lactated ringers     lactated ringers     lactated ringers 20 mL/hr at 08/11/21 0700   milrinone  nitroGLYCERIN     phenylephrine (NEO-SYNEPHRINE) Adult infusion 20 mcg/min (08/11/21 0700)   PRN Meds:.sodium chloride, albumin human, dextrose, hydrALAZINE, lactated ringers, metoprolol tartrate, midazolam, morphine injection, ondansetron (ZOFRAN) IV, oxyCODONE, sodium chloride flush, traMADol  General appearance: alert, cooperative, and no distress Neurologic: intact Heart: Paced this am  Lungs: Diminished bibasilar breath sounds Abdomen: Soft, obese, sporadic bowel sounds Extremities: SCDs in place Wound: Aquacel intact. RLE wounds are clean and dry.  Lab Results: CBC: Recent Labs    08/10/21 2200 08/11/21 0355  WBC 8.2 9.4  HGB 10.8*  10.2* 10.4*  HCT 31.4*  30.0* 30.5*  PLT 130* 136*   BMET:  Recent Labs    08/10/21 2200 08/11/21 0355  NA 138  138 138  K 4.4  4.4 3.9  CL 109 109  CO2 21* 20*  GLUCOSE  191* 158*  BUN 19 20  CREATININE 1.60* 1.65*  CALCIUM 8.8* 8.8*    CMET: Lab Results  Component Value Date   WBC 9.4 08/11/2021   HGB 10.4 (L) 08/11/2021   HCT 30.5 (L) 08/11/2021   PLT 136 (L) 08/11/2021   GLUCOSE 158 (H) 08/11/2021   CHOL 156 08/06/2021   TRIG 153 (H) 08/06/2021   HDL 34 (L) 08/06/2021   LDLCALC 91 08/06/2021   ALT 61 (H) 08/09/2021   AST 40 08/09/2021   NA 138 08/11/2021   K 3.9 08/11/2021   CL 109 08/11/2021   CREATININE 1.65 (H) 08/11/2021   BUN 20 08/11/2021   CO2 20 (L) 08/11/2021   TSH 3.342 08/09/2021   INR 1.3 (H) 08/10/2021   HGBA1C 5.7 (H) 08/09/2021      PT/INR:  Recent Labs    08/10/21 1602  LABPROT 16.1*  INR 1.3*   Radiology: DG Abd 1 View  Result Date: 08/10/2021 CLINICAL DATA:  Encounter for OG tube placement. EXAM: ABDOMEN - 1 VIEW COMPARISON:  Recent similar study today at 9:51 p.m. FINDINGS: 10:48 p.m., 08/10/2021. OG tube is in place interval advanced with tip now in the body of the stomach and appears adequately inserted. The bowel pattern is nonobstructive as far as visualized with the pelvic bowel not included in the exam. The lateral aspect of the right hemiabdomen was also cropped out. There is no supine evidence of free air. The visible visceral shadows are stable. No pathologic calcification is visible. IMPRESSION: OGT has been advanced with the tip now in the body of the stomach, grossly adequately inserted. Electronically Signed   By: Telford Nab M.D.   On: 08/10/2021 23:26   DG Abd 1 View  Result Date: 08/10/2021 CLINICAL DATA:  Check gastric catheter placement EXAM: ABDOMEN - 1 VIEW COMPARISON:  None Available. FINDINGS: Gastric catheter is noted with the tip in the stomach. Proximal side port lies in the distal esophagus. This should be advanced several cm deeper into the stomach. IMPRESSION: Gastric catheter as described. This should be advanced deeper into the stomach. Electronically Signed   By: Inez Catalina M.D.    On: 08/10/2021 21:58   DG Chest Port 1 View  Result Date: 08/11/2021 CLINICAL DATA:  Status post CABG yesterday. EXAM: PORTABLE CHEST 1 VIEW COMPARISON:  Chest x-ray from yesterday. FINDINGS: Unchanged endotracheal tube, right internal jugular Swan-Ganz catheter, right chest wall port catheter, mediastinal drain, and left chest tube. Unchanged left chest wall pacemaker. Stable cardiomediastinal silhouette status post CABG. Unchanged retrocardiac left lower lobe atelectasis. No focal consolidation, pleural effusion, or pneumothorax. IMPRESSION: 1. Stable support lines and tubes.  2. Unchanged left lower lobe atelectasis. No pneumothorax. Electronically Signed   By: Titus Dubin M.D.   On: 08/11/2021 07:52   DG Chest Port 1 View  Result Date: 08/10/2021 CLINICAL DATA:  Intubation EXAM: PORTABLE CHEST 1 VIEW COMPARISON:  08/10/2021 at 3:21 p.m. FINDINGS: Single frontal view of the chest demonstrates endotracheal tube overlying tracheal air column tip just below thoracic inlet. Right chest wall port and flow directed right internal jugular central venous catheter unchanged. Left chest tube and mediastinal drain are again noted and stable. Epicardial pacing wires and dual lead pacemaker are unchanged. Postsurgical changes from CABG. Cardiac silhouette is unremarkable. Stable consolidation within the medial left lung base consistent with atelectasis. No effusion or pneumothorax. No acute bony abnormalities. IMPRESSION: 1. Support devices as above, with appropriate position of the endotracheal tube just below thoracic inlet. 2. Continued left lower lobe atelectasis. 3. No evidence of pneumothorax. Electronically Signed   By: Randa Ngo M.D.   On: 08/10/2021 17:14   DG Chest Port 1 View  Result Date: 08/10/2021 CLINICAL DATA:  Pneumothorax. EXAM: PORTABLE CHEST 1 VIEW COMPARISON:  Radiographs 08/10/2021 and 08/05/2021.  CT 04/26/2021. FINDINGS: 1521 hours. Interval median sternotomy and CABG. Tip of the  endotracheal tube is just above the carina and could be pulled back 3 cm for more optimal positioning. Right IJ Swan-Ganz catheter extends into the main pulmonary artery. A right IJ Port-A-Cath appears unchanged at the level of the upper right atrium. Left subclavian pacemaker leads appear unchanged. A left-sided chest tube is in place. There are low lung volumes with mild left lung atelectasis. No evidence of pneumothorax or significant pleural effusion. The heart size is stable. IMPRESSION: 1. Interval CABG without evidence of pneumothorax. 2. Support system positioned as above. Tip of the endotracheal tube is just above the carina and could be pulled back 3 cm for more optimal positioning. Electronically Signed   By: Richardean Sale M.D.   On: 08/10/2021 15:38     Assessment/Plan: S/P Procedure(s) (LRB): CORONARY ARTERY BYPASS GRAFTING (CABG) X 3 USING LEFT INTERNAL MAMMARY ARTERY AND RIGHT GREATER SAPHENOUS VEIN (N/A) TRANSESOPHAGEAL ECHOCARDIOGRAM (TEE) (N/A) ENDOVEIN HARVEST OF GREATER SAPHENOUS VEIN (Right) CV-CO/CI 7.2/3.2. S/p NSTEMI. Paced (has PPM). On Milrinone and Neo Synephrine drips. Co ox this am 72.9 Pulmonary-Remains intubated. Chest tubes with 290 cc last 24 hours. CXR this am shows left base atelectasis, no pneumothorax. Hope to extubate this am. Encourage incentive spirometer once extubated Volume overload-will diurese once off Neo Synephrine Expected post op blood loss anemia-H and H this am stable at 10.4 and 30.5 CBGs 151/176/173. Transition off Insulin drip. Pre op HGA1C 5.7. He likely has pre diabetes. Will stop accu checks and SS PRN. 6. Creatinine 1.65 this am (1.57 prior to surgery). 7.  Mild thrombocytopenia-platelets this am 136,000   Donielle Liston Alba PA-C 08/11/2021 7:56 AM   Agree with assessment as above Patient extubated after iv steroids for upper airway edema Will taper iv solumedrol, diuresis and OOB to chair PPM is functioning [ interrogated by rep]  and EPWs roll and tape  patient examined and medical record reviewed,agree with above note. Dahlia Byes 08/11/2021

## 2021-08-11 NOTE — Progress Notes (Signed)
EVENING ROUNDS NOTE :     [address]       [place] [address]             [phone]                 1 Day Post-Op Procedure(s) (LRB): CORONARY ARTERY BYPASS GRAFTING (CABG) X 3 USING LEFT INTERNAL MAMMARY ARTERY AND RIGHT GREATER SAPHENOUS VEIN (N/A) TRANSESOPHAGEAL ECHOCARDIOGRAM (TEE) (N/A) ENDOVEIN HARVEST OF GREATER SAPHENOUS VEIN (Right)   Total Length of Stay:  LOS: 3 days  Events:   No events Resting comfortably    BP 138/63   Pulse 79   Temp 99.3 F (37.4 C)   Resp (!) 31   Ht '5\' 8"'$  (1.727 m)   Wt 116.9 kg   SpO2 94%   BMI 39.19 kg/m   PAP: (22-41)/(12-23) 33/13 CO:  [5.1 L/min-7.9 L/min] 7.9 L/min CI:  [2.3 L/min/m2-3.6 L/min/m2] 3.6 L/min/m2  Vent Mode: SIMV;PSV;PRVC FiO2 (%):  [40 %-50 %] 44 % Set Rate:  [4 bmp-16 bmp] 4 bmp Vt Set:  [680 mL] 680 mL PEEP:  [5 cmH20] 5 cmH20 Pressure Support:  [10 cmH20] 10 cmH20 Plateau Pressure:  [18 cmH20-24 cmH20] 18 cmH20   sodium chloride Stopped (08/11/21 1006)   sodium chloride     sodium chloride 10 mL/hr at 08/10/21 1930    ceFAZolin (ANCEF) IV Stopped (08/11/21 1509)   lactated ringers     lactated ringers Stopped (08/11/21 1420)   milrinone     nitroGLYCERIN     phenylephrine (NEO-SYNEPHRINE) Adult infusion Stopped (08/11/21 0703)    I/O last 3 completed shifts: In: 5732.5 [I.V.:2991.5; Blood:1591; IV Piggyback:1150] Out: 6962 [Urine:3300; Blood:710; Chest Tube:315]      Latest Ref Rng & Units 08/11/2021    5:48 PM 08/11/2021    8:42 AM 08/11/2021    3:55 AM  CBC  WBC 4.0 - 10.5 K/uL 17.0    9.4    Hemoglobin 13.0 - 17.0 g/dL 10.3   10.5   10.4    Hematocrit 39.0 - 52.0 % 30.5   31.0   30.5    Platelets 150 - 400 K/uL 197    136         Latest Ref Rng & Units 08/11/2021    5:48 PM 08/11/2021    8:42 AM 08/11/2021    3:55 AM  BMP  Glucose 70 - 99 mg/dL 167    158    BUN 8 - 23 mg/dL 29    20    Creatinine 0.61 - 1.24 mg/dL 2.04    1.65    Sodium 135 - 145 mmol/L 136    139   138    Potassium 3.5 - 5.1 mmol/L 4.2   3.6   3.9    Chloride 98 - 111 mmol/L 106    109    CO2 22 - 32 mmol/L 21    20    Calcium 8.9 - 10.3 mg/dL 9.1    8.8      ABG    Component Value Date/Time   PHART 7.370 08/11/2021 0842   PCO2ART 27.6 (L) 08/11/2021 0842   PO2ART 63 (L) 08/11/2021 0842   HCO3 16.0 (L) 08/11/2021 0842   TCO2 17 (L) 08/11/2021 0842   ACIDBASEDEF 8.0 (H) 08/11/2021 0842   O2SAT 91 08/11/2021 0842       Melodie Bouillon, MD 08/11/2021 6:43 PM

## 2021-08-11 NOTE — Plan of Care (Signed)

## 2021-08-11 NOTE — Progress Notes (Signed)
Wasted 0.5 mg klonopin with Scientist, product/process development.

## 2021-08-12 ENCOUNTER — Inpatient Hospital Stay (HOSPITAL_COMMUNITY)

## 2021-08-12 LAB — CBC
HCT: 29.2 % — ABNORMAL LOW (ref 39.0–52.0)
Hemoglobin: 10 g/dL — ABNORMAL LOW (ref 13.0–17.0)
MCH: 30.8 pg (ref 26.0–34.0)
MCHC: 34.2 g/dL (ref 30.0–36.0)
MCV: 89.8 fL (ref 80.0–100.0)
Platelets: 143 10*3/uL — ABNORMAL LOW (ref 150–400)
RBC: 3.25 MIL/uL — ABNORMAL LOW (ref 4.22–5.81)
RDW: 14.1 % (ref 11.5–15.5)
WBC: 14.7 10*3/uL — ABNORMAL HIGH (ref 4.0–10.5)
nRBC: 0 % (ref 0.0–0.2)

## 2021-08-12 LAB — BASIC METABOLIC PANEL
Anion gap: 9 (ref 5–15)
BUN: 39 mg/dL — ABNORMAL HIGH (ref 8–23)
CO2: 22 mmol/L (ref 22–32)
Calcium: 8.8 mg/dL — ABNORMAL LOW (ref 8.9–10.3)
Chloride: 105 mmol/L (ref 98–111)
Creatinine, Ser: 1.8 mg/dL — ABNORMAL HIGH (ref 0.61–1.24)
GFR, Estimated: 42 mL/min — ABNORMAL LOW (ref >60)
Glucose, Bld: 141 mg/dL — ABNORMAL HIGH (ref 70–99)
Potassium: 4.4 mmol/L (ref 3.5–5.1)
Sodium: 136 mmol/L (ref 135–145)

## 2021-08-12 LAB — GLUCOSE, CAPILLARY
Glucose-Capillary: 151 mg/dL — ABNORMAL HIGH (ref 70–99)
Glucose-Capillary: 122 mg/dL — ABNORMAL HIGH (ref 70–99)
Glucose-Capillary: 148 mg/dL — ABNORMAL HIGH (ref 70–99)
Glucose-Capillary: 109 mg/dL — ABNORMAL HIGH (ref 70–99)
Glucose-Capillary: 111 mg/dL — ABNORMAL HIGH (ref 70–99)

## 2021-08-12 LAB — COOXEMETRY PANEL
Carboxyhemoglobin: 1.3 % (ref 0.5–1.5)
Methemoglobin: <0.7 % (ref 0.0–1.5)
O2 Saturation: 58.7 %
Total hemoglobin: 10.3 g/dL — ABNORMAL LOW (ref 12.0–16.0)

## 2021-08-12 MED ORDER — FUROSEMIDE 10 MG/ML IJ SOLN
40.0000 mg | Freq: Every day | INTRAMUSCULAR | Status: DC
  Administered 2021-08-12: 40 mg via INTRAVENOUS
  Filled 2021-08-12 (×2): qty 4

## 2021-08-12 MED ORDER — MORPHINE SULFATE (PF) 2 MG/ML IV SOLN: 1.0000 mg | INTRAVENOUS | Status: DC | PRN

## 2021-08-12 MED ORDER — INSULIN ASPART 100 UNIT/ML IJ SOLN
0.0000 [IU] | Freq: Three times a day (TID) | INTRAMUSCULAR | Status: DC
  Administered 2021-08-12 – 2021-08-14 (×3): 3 [IU] via SUBCUTANEOUS

## 2021-08-12 MED FILL — Lidocaine HCl Local Soln Prefilled Syringe 100 MG/5ML (2%): INTRAMUSCULAR | Qty: 5 | Status: AC

## 2021-08-12 MED FILL — Heparin Sodium (Porcine) Inj 1000 Unit/ML: INTRAMUSCULAR | Qty: 30 | Status: AC

## 2021-08-12 MED FILL — Heparin Sodium (Porcine) Inj 1000 Unit/ML: INTRAMUSCULAR | Qty: 10 | Status: AC

## 2021-08-12 MED FILL — Sodium Chloride IV Soln 0.9%: INTRAVENOUS | Qty: 2000 | Status: AC

## 2021-08-12 MED FILL — Magnesium Sulfate Inj 50%: INTRAMUSCULAR | Qty: 10 | Status: AC

## 2021-08-12 MED FILL — Potassium Chloride Inj 2 mEq/ML: INTRAVENOUS | Qty: 40 | Status: AC

## 2021-08-12 MED FILL — Sodium Bicarbonate IV Soln 8.4%: INTRAVENOUS | Qty: 50 | Status: AC

## 2021-08-12 MED FILL — Heparin Sodium (Porcine) Inj 1000 Unit/ML: Qty: 1000 | Status: AC

## 2021-08-12 MED FILL — Electrolyte-R (PH 7.4) Solution: INTRAVENOUS | Qty: 4000 | Status: AC

## 2021-08-12 MED FILL — Mannitol IV Soln 20%: INTRAVENOUS | Qty: 500 | Status: AC

## 2021-08-12 NOTE — Progress Notes (Signed)
Patient ID: Edward Pitts, male   DOB: [birth year], 61 y.o.   MRN: [id number]      [address]       [place] [address]             [phone]      POD #2 CABG  BP (!) 114/55   Pulse 71   Temp 98.7 F (37.1 C) (Oral)   Resp 16   Ht '5\' 8"'$  (1.727 m)   Wt 116.9 kg   SpO2 97%   BMI 39.19 kg/m  4L  97% sat  Intake/Output Summary (Last 24 hours) at 08/12/2021 1656 Last data filed at 08/12/2021 0700 Gross per 24 hour  Intake 471.68 ml  Output 935 ml  Net -463.32 ml   Stable day   Remo Lipps C. Roxan Hockey, MD [facility] [phone]

## 2021-08-12 NOTE — TOC Initial Note (Signed)
Transition of Care [facility]) - Initial/Assessment Note    Patient Details  Name: Edward Pitts MRN: [id number] Date of Birth: 09/16/1960  Transition of Care [facility]) CM/SW Contact:    Bethena Roys, RN Phone Number: 08/12/2021, 11:00 AM  Clinical Narrative: Risk for readmission assessment completed. PTA patient was from home with spouse and sister. Patient states he has DME cane, rolling walker, and wheelchair at home. Patient POD-2 continues on oxygen and chest tube in place. Case Manager will continue to follow for disposition needs as he progresses.                 Expected Discharge Plan: [place] Barriers to Discharge: Continued Medical Work up  Expected Discharge Plan and Services Expected Discharge Plan: [place] In-house Referral: NA Discharge Planning Services: CM Consult   Living arrangements for the past 2 months: Single Family Home                  Prior Living Arrangements/Services Living arrangements for the past 2 months: Single Family Home Lives with:: Spouse, Siblings Patient language and need for interpreter reviewed:: Yes Do you feel safe going back to the place where you live?: Yes      Need for Family Participation in Patient Care: Yes (Comment) Care giver support system in place?: Yes (comment) Current home services: DME (Patient has cane, rolling walker and wheel chair at home.) Criminal Activity/Legal Involvement Pertinent to Current Situation/Hospitalization: No - Comment as needed  Activities of Daily Living Home Assistive Devices/Equipment: CPAP ADL Screening (condition at time of admission) Patient's cognitive ability adequate to safely complete daily activities?: Yes Is the patient deaf or have difficulty hearing?: No Does the patient have difficulty seeing, even when wearing glasses/contacts?: No Does the patient have difficulty concentrating, remembering, or making decisions?: No Patient able to  express need for assistance with ADLs?: Yes Does the patient have difficulty dressing or bathing?: Yes Independently performs ADLs?: Yes (appropriate for developmental age) Does the patient have difficulty walking or climbing stairs?: No Weakness of Legs: None Weakness of Arms/Hands: None  Permission Sought/Granted Permission sought to share information with : Family Supports, Case Manager        Emotional Assessment Appearance:: Appears stated age Attitude/Demeanor/Rapport: Engaged Affect (typically observed): Appropriate Orientation: : Oriented to Self, Oriented to Place, Oriented to  Time, Oriented to Situation Alcohol / Substance Use: Not Applicable Psych Involvement: No (comment)  Admission diagnosis:  NSTEMI (non-ST elevated myocardial infarction) ([place]) [I21.4] Coronary artery disease [I25.10] Patient Active Problem List   Diagnosis Date Noted   S/P CABG x 3 08/10/2021   Coronary artery disease 08/10/2021   S/P placement of cardiac pacemaker 08/08/2021   NSTEMI (non-ST elevated myocardial infarction) ([place]) 08/05/2021   Obesity, Class III, BMI 40-49.9 (morbid obesity) ([place]) 08/05/2021   Elevated serum creatinine 04/15/2021   Episodes of altered cognition 04/15/2021   Degenerative joint disease of knee 07/14/2020   Other specified visual disturbances 07/14/2020   Hx of total hip arthroplasty, left 07/14/2020   Exposure to combat 04/20/2020   Primary osteoarthritis of left hip 04/20/2020   Dizziness 12/22/2019   Arthritis of left knee 11/27/2019   Vitiligo 09/03/2019   Port-A-Cath in place 08/20/2019   Malignant melanoma of overlapping sites [facility]) 04/23/2019   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[place]) 04/16/2019   Anemia in chronic kidney disease 12/11/2018   Benign hypertensive kidney disease with chronic kidney disease 12/11/2018   Stage 3a  chronic kidney disease ([place]) 12/11/2018   HTN (hypertension) 08/24/2018   HLD (hyperlipidemia) 08/24/2018   Seizures  ([place]) 08/24/2018   Complete heart block ([place]) 08/24/2018   Arthritis of wrist, left 06/12/2018   Bradycardia 06/04/2018   Chronic gouty arthropathy without tophi 03/13/2018   Positive ANA (antinuclear antibody) 03/05/2018   Swelling of joint of left wrist 03/05/2018   Coronary artery disease involving native coronary artery of native heart 07/09/2017   Stable angina ([place]) 06/27/2017   Benign essential HTN 05/25/2017   LBBB (left bundle branch block) 05/25/2017   PCP:  Mechele Claude, FNP Pharmacy:   [id number] [place] [id number]: [phone]: [phone]  Readmission Risk Interventions    08/12/2021   10:56 AM  Readmission Risk Prevention Plan  Transportation Screening Complete  PCP or Specialist Appt within 3-5 Days Complete  HRI or Home Care Consult Complete  Social Work Consult for RLloydPlanning/Counseling Complete  Palliative Care Screening Not Applicable  Medication Review (Press photographer Complete

## 2021-08-12 NOTE — Progress Notes (Signed)
Per RN, Anderson Malta states IJ was removed.

## 2021-08-12 NOTE — Progress Notes (Signed)
2 Days Post-Op Procedure(s) (LRB): CORONARY ARTERY BYPASS GRAFTING (CABG) X 3 USING LEFT INTERNAL MAMMARY ARTERY AND RIGHT GREATER SAPHENOUS VEIN (N/A) TRANSESOPHAGEAL ECHOCARDIOGRAM (TEE) (N/A) ENDOVEIN HARVEST OF GREATER SAPHENOUS VEIN (Right) Subjective: Needs 5-6 L O2 for adequate sats Up to chair had BM A-V paced with PPM Objective: Vital signs in last 24 hours: Temp:  [98.2 F (36.8 C)-99.3 F (37.4 C)] 98.4 F (36.9 C) (05/19 0400) Pulse Rate:  [68-108] 89 (05/19 0700) Cardiac Rhythm: Ventricular paced (05/19 0000) Resp:  [14-36] 16 (05/19 0700) BP: (104-138)/(52-114) 131/88 (05/19 0600) SpO2:  [88 %-97 %] 95 % (05/19 0700) FiO2 (%):  [44 %] 44 % (05/18 0852) Weight:  [116.9 kg] 116.9 kg (05/19 0500)  Hemodynamic parameters for last 24 hours: PAP: (30-41)/(12-20) 33/13 CVP:  [0 mmHg] 0 mmHg  Intake/Output from previous day: 05/18 0701 - 05/19 0700 In: 726.7 [P.O.:240; I.V.:286.8; IV Piggyback:199.9] Out: 1525 [Urine:1220; Chest Tube:305] Intake/Output this shift: No intake/output data recorded.       Exam    General- alert and comfortable no air leak from tubes    Neck- no JVD, no cervical adenopathy palpable, no carotid bruit   Lungs- clear without rales, wheezes   Cor- regular rate and rhythm, no murmur , gallop   Abdomen- soft, non-tender   Extremities - warm, non-tender, minimal edema   Neuro- oriented, appropriate, no focal weakness   Lab Results: Recent Labs    08/11/21 0355 08/11/21 0842 08/11/21 1748  WBC 9.4  --  17.0*  HGB 10.4* 10.5* 10.3*  HCT 30.5* 31.0* 30.5*  PLT 136*  --  197   BMET:  Recent Labs    08/11/21 0355 08/11/21 0842 08/11/21 1748  NA 138 139 136  K 3.9 3.6 4.2  CL 109  --  106  CO2 20*  --  21*  GLUCOSE 158*  --  167*  BUN 20  --  29*  CREATININE 1.65*  --  2.04*  CALCIUM 8.8*  --  9.1    PT/INR:  Recent Labs    08/10/21 1602  LABPROT 16.1*  INR 1.3*   ABG    Component Value Date/Time   PHART 7.370  08/11/2021 0842   HCO3 16.0 (L) 08/11/2021 0842   TCO2 17 (L) 08/11/2021 0842   ACIDBASEDEF 8.0 (H) 08/11/2021 0842   O2SAT 58.7 08/12/2021 0219   CBG (last 3)  Recent Labs    08/11/21 2219 08/12/21 0213 08/12/21 0604  GLUCAP 135* 151* 122*    Assessment/Plan: S/P Procedure(s) (LRB): CORONARY ARTERY BYPASS GRAFTING (CABG) X 3 USING LEFT INTERNAL MAMMARY ARTERY AND RIGHT GREATER SAPHENOUS VEIN (N/A) TRANSESOPHAGEAL ECHOCARDIOGRAM (TEE) (N/A) ENDOVEIN HARVEST OF GREATER SAPHENOUS VEIN (Right) Mobilize Diuresis Diabetes control See progression orders Keep in icu due to O2 requirements Follow CT - may come out later today  LOS: 4 days    Edward Pitts 08/12/2021

## 2021-08-13 ENCOUNTER — Inpatient Hospital Stay (HOSPITAL_COMMUNITY)

## 2021-08-13 LAB — BASIC METABOLIC PANEL
Anion gap: 10 (ref 5–15)
BUN: 43 mg/dL — ABNORMAL HIGH (ref 8–23)
CO2: 19 mmol/L — ABNORMAL LOW (ref 22–32)
Calcium: 9.1 mg/dL (ref 8.9–10.3)
Chloride: 107 mmol/L (ref 98–111)
Creatinine, Ser: 1.63 mg/dL — ABNORMAL HIGH (ref 0.61–1.24)
GFR, Estimated: 48 mL/min — ABNORMAL LOW (ref >60)
Glucose, Bld: 101 mg/dL — ABNORMAL HIGH (ref 70–99)
Potassium: 5.1 mmol/L (ref 3.5–5.1)
Sodium: 136 mmol/L (ref 135–145)

## 2021-08-13 LAB — CBC
HCT: 29.7 % — ABNORMAL LOW (ref 39.0–52.0)
Hemoglobin: 10 g/dL — ABNORMAL LOW (ref 13.0–17.0)
MCH: 30 pg (ref 26.0–34.0)
MCHC: 33.7 g/dL (ref 30.0–36.0)
MCV: 89.2 fL (ref 80.0–100.0)
Platelets: 174 10*3/uL (ref 150–400)
RBC: 3.33 MIL/uL — ABNORMAL LOW (ref 4.22–5.81)
RDW: 14.1 % (ref 11.5–15.5)
WBC: 14.6 10*3/uL — ABNORMAL HIGH (ref 4.0–10.5)
nRBC: 0.1 % (ref 0.0–0.2)

## 2021-08-13 LAB — GLUCOSE, CAPILLARY
Glucose-Capillary: 97 mg/dL (ref 70–99)
Glucose-Capillary: 101 mg/dL — ABNORMAL HIGH (ref 70–99)
Glucose-Capillary: 85 mg/dL (ref 70–99)
Glucose-Capillary: 110 mg/dL — ABNORMAL HIGH (ref 70–99)

## 2021-08-13 MED ORDER — LACOSAMIDE 50 MG PO TABS
100.0000 mg | ORAL_TABLET | Freq: Two times a day (BID) | ORAL | Status: DC
Start: 2021-08-13 — End: 2021-08-16
  Administered 2021-08-13 – 2021-08-16 (×6): 100 mg via ORAL
  Filled 2021-08-13 (×6): qty 2

## 2021-08-13 MED ORDER — ALLOPURINOL 300 MG PO TABS
300.0000 mg | ORAL_TABLET | Freq: Every day | ORAL | Status: DC
  Administered 2021-08-13 – 2021-08-16 (×4): 300 mg via ORAL
  Filled 2021-08-13 (×4): qty 1

## 2021-08-13 MED ORDER — MAGNESIUM HYDROXIDE 400 MG/5ML PO SUSP: 30.0000 mL | Freq: Every day | ORAL | Status: DC | PRN

## 2021-08-13 MED ORDER — METOPROLOL SUCCINATE ER 25 MG PO TB24
25.0000 mg | ORAL_TABLET | Freq: Every day | ORAL | Status: DC
  Administered 2021-08-13 – 2021-08-15 (×3): 25 mg via ORAL
  Filled 2021-08-13 (×2): qty 1

## 2021-08-13 MED ORDER — SODIUM CHLORIDE 0.9 % IV SOLN
250.0000 mL | INTRAVENOUS | Status: DC | PRN
Start: 2021-08-13 — End: 2021-08-16

## 2021-08-13 MED ORDER — ALUM & MAG HYDROXIDE-SIMETH 200-200-20 MG/5ML PO SUSP: 15.0000 mL | Freq: Four times a day (QID) | ORAL | Status: DC | PRN

## 2021-08-13 MED ORDER — SODIUM CHLORIDE 0.9% FLUSH: 3.0000 mL | INTRAVENOUS | Status: DC | PRN

## 2021-08-13 MED ORDER — ~~LOC~~ CARDIAC SURGERY, PATIENT & FAMILY EDUCATION: Freq: Once | Status: DC

## 2021-08-13 MED ORDER — FUROSEMIDE 40 MG PO TABS
40.0000 mg | ORAL_TABLET | Freq: Every day | ORAL | Status: DC
  Administered 2021-08-13 – 2021-08-14 (×2): 40 mg via ORAL
  Filled 2021-08-13 (×2): qty 1

## 2021-08-13 MED ORDER — SODIUM CHLORIDE 0.9% FLUSH
3.0000 mL | Freq: Two times a day (BID) | INTRAVENOUS | Status: DC
  Administered 2021-08-13 – 2021-08-16 (×7): 3 mL via INTRAVENOUS

## 2021-08-13 NOTE — Progress Notes (Signed)
3 Days Post-Op Procedure(s) (LRB): CORONARY ARTERY BYPASS GRAFTING (CABG) X 3 USING LEFT INTERNAL MAMMARY ARTERY AND RIGHT GREATER SAPHENOUS VEIN (N/A) TRANSESOPHAGEAL ECHOCARDIOGRAM (TEE) (N/A) ENDOVEIN HARVEST OF GREATER SAPHENOUS VEIN (Right) Subjective: Some incisional pain  Objective: Vital signs in last 24 hours: Temp:  [98.2 F (36.8 C)-98.7 F (37.1 C)] 98.4 F (36.9 C) (05/20 0400) Pulse Rate:  [68-97] 71 (05/20 0500) Cardiac Rhythm: Ventricular paced (05/19 2020) Resp:  [14-27] 22 (05/20 0700) BP: (105-148)/(55-88) 141/88 (05/20 0700) SpO2:  [93 %-99 %] 99 % (05/20 0500) Weight:  [116.9 kg] 116.9 kg (05/20 0500)  Hemodynamic parameters for last 24 hours:    Intake/Output from previous day: 05/19 0701 - 05/20 0700 In: 480 [P.O.:480] Out: 690 [Urine:600; Chest Tube:90] Intake/Output this shift: No intake/output data recorded.  General appearance: alert, cooperative, and no distress Neurologic: intact Heart: regular rate and rhythm Lungs: diminished breath sounds bibasilar Abdomen: normal findings: soft, non-tender  Lab Results: Recent Labs    08/12/21 1006 08/13/21 0210  WBC 14.7* 14.6*  HGB 10.0* 10.0*  HCT 29.2* 29.7*  PLT 143* 174   BMET:  Recent Labs    08/12/21 1006 08/13/21 0210  NA 136 136  K 4.4 5.1  CL 105 107  CO2 22 19*  GLUCOSE 141* 101*  BUN 39* 43*  CREATININE 1.80* 1.63*  CALCIUM 8.8* 9.1    PT/INR:  Recent Labs    08/10/21 1602  LABPROT 16.1*  INR 1.3*   ABG    Component Value Date/Time   PHART 7.370 08/11/2021 0842   HCO3 16.0 (L) 08/11/2021 0842   TCO2 17 (L) 08/11/2021 0842   ACIDBASEDEF 8.0 (H) 08/11/2021 0842   O2SAT 58.7 08/12/2021 0219   CBG (last 3)  Recent Labs    08/12/21 1608 08/12/21 2221 08/13/21 0628  GLUCAP 109* 111* 97    Assessment/Plan: S/P Procedure(s) (LRB): CORONARY ARTERY BYPASS GRAFTING (CABG) X 3 USING LEFT INTERNAL MAMMARY ARTERY AND RIGHT GREATER SAPHENOUS VEIN  (N/A) TRANSESOPHAGEAL ECHOCARDIOGRAM (TEE) (N/A) ENDOVEIN HARVEST OF GREATER SAPHENOUS VEIN (Right) POD # 3 NEURO- intact, continue antiseizure meds CV- paced rhythm  Continue beta blocker, ASA, statin  Resume lisinopril PTD  Dc external pacer wires RESP- continue IS for atelectasis RENAL- creatinine down to 1.63, baseline 1.5  Lytes ok  PO Lasix ENDO- CBG well controlled  Dc levemir, continue SSI GI- tolerating diet SCD + enoxaparin for DVT prophylaxis Dc chest tubes Transfer to tele bed   LOS: 5 days    Edward Pitts 08/13/2021

## 2021-08-13 NOTE — Progress Notes (Signed)
Per physician order, nurse attempts to remove Epicardial wires.  Left wire removal was uneventful.  Right wire had increasing resistance while being removed, eventually gathering enough resistance to where this nurse was uncomfortable applying more pressure.  Second nurse attempted to remove but agreed the wire would not move with the appropriate amount of pressure.  Charge nurse assessed and agreed not to attempt to pull anymore and contact physician.  Dr. Roxan Hockey contacted and verbal order given to leave wire in place and secure.  Left wire appropriately dressed and secured with gauze and paper tape.

## 2021-08-13 NOTE — Evaluation (Signed)
Occupational Therapy Evaluation Patient Details Name: Edward Pitts MRN: [id number] DOB: [birth year] Today's Date: 08/13/2021   History of Present Illness Edward Pitts is a 61 y/o male admitted 08/05/21 with SOB, dx with NSTEMI s/p L heart cath 08/08/21 at [facility], transferred to [facility] for CABG (5/17), TEE, Endovein harvest of greater saphenous vein. Vent support from 08/10/21-08/11/21. 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   Clinical Impression   Pt is typically mod I for mobility (no DME) and mostly mod I for ADL with the exception of socks/shoes on LLE - wife assists due to chronic edema and issues related to his cancer. Sternal Precautions handout provided. Today Pt was able to complete transfers at min guard with good sternal precaution compliance, standing grooming at sink at min guard, max A for LB ADL, max A for UB ADL. Pt needs further sternal precaution/compensatory strategy education. OT will follow acutely with post-acute as HHOT (will follow closely for progression). Next session plan on taking AE kit for demo/practice education to maximize independence.       Recommendations for follow up therapy are one component of a multi-disciplinary discharge planning process, led by the attending physician.  Recommendations may be updated based on patient status, additional functional criteria and insurance authorization.   Follow Up Recommendations  Home health OT    Assistance Recommended at Discharge Intermittent Supervision/Assistance  Patient can return home with the following A little help with walking and/or transfers;A lot of help with  bathing/dressing/bathroom;Assistance with cooking/housework;Assist for transportation;Help with stairs or ramp for entrance    Functional Status Assessment  Patient has had a recent decline in their functional status and demonstrates the ability to make significant improvements in function in a reasonable and predictable amount of time.  Equipment Recommendations  BSC/3in1 (to be used a shower chair)    Recommendations for Other Services PT consult     Precautions / Restrictions Precautions Precautions: Sternal Precaution Booklet Issued: Yes (comment) Restrictions Weight Bearing Restrictions: Yes Other Position/Activity Restrictions: sternal      Mobility Bed Mobility               General bed mobility comments: OOB in recliner at end of session, initiatated session standing in bathroom    Transfers Overall transfer level: Needs assistance Equipment used:  (EVA walker) Transfers: Sit to/from Stand Sit to Stand: Min guard           General transfer comment: good power up and compliance with sternal precautions      Balance Overall balance assessment: Needs assistance Sitting-balance support: No upper extremity supported, Feet supported Sitting balance-Leahy Scale: Good     Standing balance support: Bilateral upper extremity supported, Reliant on assistive device for balance Standing balance-Leahy Scale: Poor Standing balance comment: dependent on EVA walker                           ADL either performed or assessed with clinical judgement   ADL Overall ADL's : Needs assistance/impaired Eating/Feeding: Set up;Sitting   Grooming: Wash/dry hands;Wash/dry face;Min guard;Standing   Upper Body Bathing: Moderate assistance;Sitting   Lower Body Bathing: Maximal assistance;Sit to/from stand Lower  Body Bathing Details (indicate cue type and reason): will benefit from AE education Upper Body Dressing : Maximal assistance;Sitting;Cueing for  sequencing;Cueing for compensatory techniques   Lower Body Dressing: Maximal assistance;Sit to/from stand Lower Body Dressing Details (indicate cue type and reason): gets assist from wife at baseline for LLE Toilet Transfer: Min guard;Ambulation (EVA walker)   Toileting- Clothing Manipulation and Hygiene: Moderate assistance;Cueing for compensatory techniques;Sit to/from stand Toileting - Clothing Manipulation Details (indicate cue type and reason): educated on compensatory strategies for rear peri care Tub/ Shower Transfer: Min guard;Ambulation (eva)   Functional mobility during ADLs: Min guard (EVA walker)       Vision Baseline Vision/History: 1 Wears glasses Ability to See in Adequate Light: 0 Adequate Patient Visual Report: No change from baseline       Perception     Praxis      Pertinent Vitals/Pain Pain Assessment Pain Assessment: Faces Faces Pain Scale: Hurts a little bit Pain Location: sternal Pain Descriptors / Indicators: Grimacing Pain Intervention(s): Monitored during session, Repositioned     Hand Dominance Right   Extremity/Trunk Assessment Upper Extremity Assessment Upper Extremity Assessment: Overall WFL for tasks assessed (limited by sternal restrictinos/ROM)   Lower Extremity Assessment Lower Extremity Assessment: Defer to PT evaluation   Cervical / Trunk Assessment Cervical / Trunk Assessment:  (s/p CABG)   Communication Communication Communication: No difficulties   Cognition Arousal/Alertness: Awake/alert Behavior During Therapy: WFL for tasks assessed/performed, Flat affect Overall Cognitive Status: Within Functional Limits for tasks assessed                                       General Comments  VSS on RA throughout; encouraged use of Incentive Spirometer    Exercises     Shoulder Instructions      Home Living Family/patient expects to be discharged to:: Private residence Living Arrangements: Spouse/significant  other Available Help at Discharge: Family;Available PRN/intermittently Type of Home: House Home Access: Stairs to enter [employer] of Steps: 1 Entrance Stairs-Rails: Right Home Layout: One level     Bathroom Shower/Tub: Tub/shower unit;Walk-in shower   Bathroom Toilet: Standard Bathroom Accessibility: Yes   Home Equipment: Conservation officer, nature (2 wheels);Cane - single point;BSC/3in1;Wheelchair - manual          Prior Functioning/Environment Prior Level of Function : Needs assist       Physical Assist : ADLs (physical)   ADLs (physical): Dressing;IADLs Mobility Comments: Independent, ADLs Comments: needs some assist with LB dressing. Drives.        OT Problem List: Decreased range of motion;Decreased activity tolerance;Impaired balance (sitting and/or standing);Decreased safety awareness;Decreased knowledge of use of DME or AE;Decreased knowledge of precautions;Cardiopulmonary status limiting activity;Impaired UE functional use;Pain      OT Treatment/Interventions: Self-care/ADL training;Energy conservation;DME and/or AE instruction;Therapeutic activities;Patient/family education;Balance training    OT Goals(Current goals can be found in the care plan section) Acute Rehab OT Goals Patient Stated Goal: get better and get home OT Goal Formulation: With patient Time For Goal Achievement: 08/27/21 Potential to Achieve Goals: Good ADL Goals Pt Will Perform Grooming: with modified independence;standing Pt Will Perform Upper Body Bathing: with modified independence;sitting;with adaptive equipment Pt Will Perform Lower Body Bathing: with modified independence;sit to/from stand;with adaptive equipment Pt Will Perform Upper Body Dressing: with modified independence;sitting Pt Will Perform Lower Body Dressing: with modified independence;with adaptive equipment;sit to/from stand Pt Will Transfer to Toilet: with modified independence;ambulating Pt  Will Perform Toileting -  Clothing Manipulation and hygiene: with modified independence;sit to/from stand Additional ADL Goal #1: Pt will maintain sternal precautions throughout ADL routine with no cues.  OT Frequency: Min 2X/week    Co-evaluation PT/OT/SLP Co-Evaluation/Treatment: Yes Reason for Co-Treatment: For patient/therapist safety;To address functional/ADL transfers PT goals addressed during session: Mobility/safety with mobility;Balance;Proper use of DME OT goals addressed during session: ADL's and self-care;Strengthening/ROM;Proper use of Adaptive equipment and DME      AM-PAC OT "6 Clicks" Daily Activity     Outcome Measure Help from another person eating meals?: None Help from another person taking care of personal grooming?: A Little Help from another person toileting, which includes using toliet, bedpan, or urinal?: A Little Help from another person bathing (including washing, rinsing, drying)?: A Lot Help from another person to put on and taking off regular upper body clothing?: A Lot Help from another person to put on and taking off regular lower body clothing?: A Lot 6 Click Score: 16   End of Session Equipment Utilized During Treatment:  (EVA walker) Nurse Communication: Mobility status;Precautions;Weight bearing status;Other (comment) (no chair alarm)  Activity Tolerance: Patient tolerated treatment well Patient left: in chair;with call bell/phone within reach  OT Visit Diagnosis: Unsteadiness on feet (R26.81);Muscle weakness (generalized) (M62.81)                Time: 4159-3012 OT Time Calculation (min): 28 min Charges:  OT General Charges $OT Visit: 1 Visit OT Evaluation $OT Eval Moderate Complexity: [place] OTR/L Acute Rehabilitation Services Pager: [phone] Office: [place] 08/13/2021, 9:54 AM

## 2021-08-13 NOTE — Plan of Care (Signed)
  Problem: Education: Goal: Knowledge of General Education information will improve Description: Including pain rating scale, medication(s)/side effects and non-pharmacologic comfort measures Outcome: Progressing   Problem: Health Behavior/Discharge Planning: Goal: Ability to manage health-related needs will improve Outcome: Progressing   Problem: Clinical Measurements: Goal: Will remain free from infection Outcome: Progressing   

## 2021-08-13 NOTE — Evaluation (Signed)
Physical Therapy Evaluation Patient Details Name: Edward Pitts MRN: [id number] DOB: 1960/11/09 Today's Date: 08/13/2021  History of Present Illness  Edward Pitts is a 61 y/o male admitted 08/05/21 with SOB, dx with NSTEMI s/p L heart cath 08/08/21 at [facility], transferred to [facility] for CABG (5/17), TEE, Endovein harvest of greater saphenous vein. Vent support from 08/10/21-08/11/21. 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  Clinical Impression  Patient presents with generalized weakness/deconditioning, dyspnea on exertion, decreased endurance, pain, impaired balance and impaired mobility s/p above. Pt lives at home with his wife and reports being independent for ambulation and requiring some assist for LB ADls and IADLs at baseline. Today, pt requires close Min guard and use of EVA for transfers and ambulation requiring cues to adhere to sternal precautions during transitions. 2/4 DOE noted with activity but VSS. Reviewed "move in the tube" and sternal precautions as they related to mobility. Will plan to progress to RW next session. Will follow acutely to maximize independence and mobility prior to return home.     Recommendations for follow up therapy are one component of a multi-disciplinary discharge planning process, led by the attending physician.  Recommendations may be updated based on patient status, additional functional criteria and insurance authorization.  Follow Up Recommendations Home health PT (pending progress)    Assistance Recommended at Discharge Intermittent Supervision/Assistance  Patient can return home with the following  A little help with walking and/or  transfers;A little help with bathing/dressing/bathroom;Assistance with cooking/housework;Assist for transportation;Help with stairs or ramp for entrance    Equipment Recommendations None recommended by PT  Recommendations for Other Services       Functional Status Assessment Patient has had a recent decline in their functional status and demonstrates the ability to make significant improvements in function in a reasonable and predictable amount of time.     Precautions / Restrictions Precautions Precautions: Sternal Precaution Booklet Issued: Yes (comment) Restrictions Weight Bearing Restrictions: Yes Other Position/Activity Restrictions: sternal      Mobility  Bed Mobility               General bed mobility comments: Standing with EVA upon PT arrival.    Transfers Overall transfer level: Needs assistance Equipment used:  (EVA walker) Transfers: Sit to/from Stand Sit to Stand: Min guard           General transfer comment: Min guard for safety. Stood from chair x5 with cues to adhere to sternal precautions and to remove UEs from EVA prior to sitting. Cues for breathing.    Ambulation/Gait Ambulation/Gait assistance: Min guard Gait Distance (Feet): 295 Feet Assistive device: Ethelene Hal Gait Pattern/deviations: Step-through pattern, Decreased stride length Gait velocity: decreased Gait velocity interpretation: 1.31 - 2.62 ft/sec, indicative of limited community ambulator   General Gait Details: Slow, mostly steady gait with EVA for support; 2/4 DOE. HR ranging from 90-115 bpm.  Stairs            Wheelchair Mobility    Modified Rankin (Stroke Patients Only)       Balance Overall balance assessment: Needs assistance Sitting-balance support: Feet supported, No upper extremity supported Sitting balance-Leahy Scale: Good     Standing balance support: During functional activity, Reliant on  assistive device for balance, Single extremity  supported Standing balance-Leahy Scale: Poor Standing balance comment: dependent on EVA walker and UE support for standing tasks                             Pertinent Vitals/Pain Pain Assessment Pain Assessment: Faces Faces Pain Scale: Hurts a little bit Pain Location: sternal Pain Descriptors / Indicators: Grimacing Pain Intervention(s): Monitored during session, Repositioned    Home Living Family/patient expects to be discharged to:: Private residence Living Arrangements: Spouse/significant other Available Help at Discharge: Family;Available PRN/intermittently Type of Home: House Home Access: Stairs to enter Entrance Stairs-Rails: Right Entrance Stairs-Number of Steps: 1   Home Layout: One level Home Equipment: Conservation officer, nature (2 wheels);Cane - single point;BSC/3in1;Wheelchair - manual      Prior Function Prior Level of Function : Needs assist       Physical Assist : ADLs (physical)   ADLs (physical): Dressing;IADLs Mobility Comments: Independent, ADLs Comments: needs some assist with LB dressing. Drives.     Hand Dominance   Dominant Hand: Right    Extremity/Trunk Assessment   Upper Extremity Assessment Upper Extremity Assessment: Defer to OT evaluation    Lower Extremity Assessment Lower Extremity Assessment: Generalized weakness (but functional)    Cervical / Trunk Assessment Cervical / Trunk Assessment: Normal  Communication   Communication: No difficulties  Cognition Arousal/Alertness: Awake/alert Behavior During Therapy: WFL for tasks assessed/performed, Flat affect Overall Cognitive Status: Within Functional Limits for tasks assessed                                 General Comments: Good sense of humor, very flat        General Comments General comments (skin integrity, edema, etc.): VSS on RA throughout; encouraged use of IS.    Exercises     Assessment/Plan    PT Assessment Patient needs continued PT services   PT Problem List Decreased strength;Decreased mobility;Decreased skin integrity;Cardiopulmonary status limiting activity;Decreased activity tolerance;Decreased knowledge of precautions;Decreased balance;Decreased knowledge of use of DME;Pain       PT Treatment Interventions Therapeutic exercise;DME instruction;Gait training;Patient/family education;Therapeutic activities;Functional mobility training;Stair training;Balance training    PT Goals (Current goals can be found in the Care Plan section)  Acute Rehab PT Goals Patient Stated Goal: to go home PT Goal Formulation: With patient Time For Goal Achievement: 08/27/21 Potential to Achieve Goals: Good    Frequency Min 3X/week     Co-evaluation PT/OT/SLP Co-Evaluation/Treatment: Yes Reason for Co-Treatment: For patient/therapist safety;To address functional/ADL transfers PT goals addressed during session: Mobility/safety with mobility;Balance;Proper use of DME;Strengthening/ROM OT goals addressed during session: ADL's and self-care;Strengthening/ROM;Proper use of Adaptive equipment and DME       AM-PAC PT "6 Clicks" Mobility  Outcome Measure Help needed turning from your back to your side while in a flat bed without using bedrails?: A Little Help needed moving from lying on your back to sitting on the side of a flat bed without using bedrails?: A Little Help needed moving to and from a bed to a chair (including a wheelchair)?: A Little Help needed standing up from a chair using your arms (e.g., wheelchair or bedside chair)?: A Little Help needed to walk in hospital room?: A Little Help needed climbing 3-5 steps with a railing? : A Lot 6 Click Score: 17    End of Session   Activity Tolerance: Patient tolerated  treatment well Patient left: in chair;with call bell/phone within reach Nurse Communication: Mobility status PT Visit Diagnosis: Pain;Muscle weakness (generalized) (M62.81);Difficulty in walking, not elsewhere classified  (R26.2) Pain - part of body:  (sternum)    Time: 4492-0100 PT Time Calculation (min) (ACUTE ONLY): 19 min   Charges:   PT Evaluation $PT Eval Moderate Complexity: 1 Mod          Marisa Severin, PT, DPT Acute Rehabilitation Services Secure chat preferred Office [place] 08/13/2021, 11:52 AM

## 2021-08-14 ENCOUNTER — Inpatient Hospital Stay (HOSPITAL_COMMUNITY)

## 2021-08-14 LAB — BASIC METABOLIC PANEL
Anion gap: 10 (ref 5–15)
BUN: 40 mg/dL — ABNORMAL HIGH (ref 8–23)
CO2: 21 mmol/L — ABNORMAL LOW (ref 22–32)
Calcium: 8.9 mg/dL (ref 8.9–10.3)
Chloride: 107 mmol/L (ref 98–111)
Creatinine, Ser: 1.51 mg/dL — ABNORMAL HIGH (ref 0.61–1.24)
GFR, Estimated: 52 mL/min — ABNORMAL LOW (ref >60)
Glucose, Bld: 111 mg/dL — ABNORMAL HIGH (ref 70–99)
Potassium: 4.7 mmol/L (ref 3.5–5.1)
Sodium: 138 mmol/L (ref 135–145)

## 2021-08-14 LAB — GLUCOSE, CAPILLARY
Glucose-Capillary: 113 mg/dL — ABNORMAL HIGH (ref 70–99)
Glucose-Capillary: 98 mg/dL (ref 70–99)
Glucose-Capillary: 142 mg/dL — ABNORMAL HIGH (ref 70–99)
Glucose-Capillary: 122 mg/dL — ABNORMAL HIGH (ref 70–99)

## 2021-08-14 LAB — CBC
HCT: 22.9 % — ABNORMAL LOW (ref 39.0–52.0)
Hemoglobin: 8.3 g/dL — ABNORMAL LOW (ref 13.0–17.0)
MCH: 31.2 pg (ref 26.0–34.0)
MCHC: 36.2 g/dL — ABNORMAL HIGH (ref 30.0–36.0)
MCV: 86.1 fL (ref 80.0–100.0)
Platelets: 180 10*3/uL (ref 150–400)
RBC: 2.66 MIL/uL — ABNORMAL LOW (ref 4.22–5.81)
RDW: 13.9 % (ref 11.5–15.5)
WBC: 9.8 10*3/uL (ref 4.0–10.5)
nRBC: 0.8 % — ABNORMAL HIGH (ref 0.0–0.2)

## 2021-08-14 NOTE — Progress Notes (Signed)
Pt has home unit CPAP at beside. Patient is able to place on and off when ready.

## 2021-08-14 NOTE — Plan of Care (Signed)
  Problem: Education: Goal: Knowledge of General Education information will improve Description: Including pain rating scale, medication(s)/side effects and non-pharmacologic comfort measures Outcome: Progressing   Problem: Health Behavior/Discharge Planning: Goal: Ability to manage health-related needs will improve Outcome: Progressing   Problem: Clinical Measurements: Goal: Will remain free from infection Outcome: Progressing Goal: Cardiovascular complication will be avoided Outcome: Progressing

## 2021-08-14 NOTE — Progress Notes (Addendum)
[address]       [place], [address]             [phone]      4 Days Post-Op Procedure(s) (LRB): CORONARY ARTERY BYPASS GRAFTING (CABG) X 3 USING LEFT INTERNAL MAMMARY ARTERY AND RIGHT GREATER SAPHENOUS VEIN (N/A) TRANSESOPHAGEAL ECHOCARDIOGRAM (TEE) (N/A) ENDOVEIN HARVEST OF GREATER SAPHENOUS VEIN (Right) Subjective: No specific c/o  Objective: Vital signs in last 24 hours: Temp:  [98.1 F (36.7 C)-98.4 F (36.9 C)] 98.1 F (36.7 C) (05/21 0740) Pulse Rate:  [60-112] 61 (05/21 0740) Cardiac Rhythm: Ventricular paced (05/20 1900) Resp:  [0-27] 16 (05/21 0740) BP: (121-151)/(55-104) 149/59 (05/21 0740) SpO2:  [91 %-98 %] 94 % (05/21 0750)  Hemodynamic parameters for last 24 hours:    Intake/Output from previous day: No intake/output data recorded. Intake/Output this shift: No intake/output data recorded.  General appearance: alert, cooperative, and no distress Heart: regular rate and rhythm Lungs: clear to auscultation bilaterally Abdomen: benign Extremities: + edema Wound: incis healing well  Lab Results: Recent Labs    08/13/21 0210 08/14/21 0351  WBC 14.6* 9.8  HGB 10.0* 8.3*  HCT 29.7* 22.9*  PLT 174 180   BMET:  Recent Labs    08/13/21 0210 08/14/21 0351  NA 136 138  K 5.1 4.7  CL 107 107  CO2 19* 21*  GLUCOSE 101* 111*  BUN 43* 40*  CREATININE 1.63* 1.51*  CALCIUM 9.1 8.9    PT/INR: No results for input(s): LABPROT, INR in the last 72 hours. ABG    Component Value Date/Time   PHART 7.370 08/11/2021 0842   HCO3 16.0 (L) 08/11/2021 0842   TCO2 17 (L) 08/11/2021 0842   ACIDBASEDEF 8.0 (H) 08/11/2021 0842   O2SAT 58.7 08/12/2021 0219   CBG (last 3)  Recent Labs    08/13/21 1637 08/13/21 2133 08/14/21 0620  GLUCAP 85 110* 113*    Meds Scheduled Meds:  acetaminophen  1,000 mg Oral Q6H   Or   acetaminophen (TYLENOL) oral liquid 160 mg/5 mL  1,000 mg Per Tube Q6H   allopurinol  300 mg Oral Daily    aspirin EC  325 mg Oral Daily   Or   aspirin  324 mg Per Tube Daily   atorvastatin  20 mg Oral Daily   bisacodyl  10 mg Oral Daily   Or   bisacodyl  10 mg Rectal Daily   budesonide (PULMICORT) nebulizer solution  0.5 mg Nebulization BID   chlorhexidine gluconate (MEDLINE KIT)  15 mL Mouth Rinse BID   clonazePAM  0.5 mg Oral Daily   clonazePAM  1 mg Oral QHS   [facility] Patient [facility] Family Education   Does not apply Once   docusate sodium  200 mg Oral Daily   enoxaparin (LOVENOX) injection  40 mg Subcutaneous Q24H   furosemide  40 mg Oral Daily   guaiFENesin  600 mg Oral BID   insulin aspart  0-20 Units Subcutaneous TID WC & HS   lacosamide  100 mg Oral BID   lidocaine  2 patch Transdermal Q24H   metoprolol succinate  25 mg Oral Daily   sodium chloride flush  3 mL Intravenous Q12H   Continuous Infusions:  sodium chloride     PRN Meds:.sodium chloride, alum & mag hydroxide-simeth, hydrALAZINE, magnesium hydroxide, metoprolol tartrate, ondansetron (ZOFRAN) IV, oxyCODONE, sodium chloride flush, traMADol  Xrays DG Chest Port 1 View  Result Date: 08/13/2021 CLINICAL DATA:  Status post coronary artery bypass surgery EXAM: PORTABLE CHEST 1 VIEW COMPARISON:  Previous studies including the examination of 08/12/2021 FINDINGS: Left chest tube is seen. There is no demonstrable pneumothorax. There is interval decrease in linear densities in the medial left lower lung fields. There are no signs of pulmonary edema or new focal infiltrates. There is coronary bypass surgery. Tip of right IJ chest port is seen in the superior vena cava. Pacemaker battery is seen in the left infraclavicular region. There is interval removal of vascular sheath from right IJ. IMPRESSION: There is slight improvement in subsegmental atelectasis seen in the left lower lung fields. Other findings as described in the body of the report. Electronically Signed   By: Elmer Picker M.D.   On: 08/13/2021 09:43     Assessment/Plan: S/P Procedure(s) (LRB): CORONARY ARTERY BYPASS GRAFTING (CABG) X 3 USING LEFT INTERNAL MAMMARY ARTERY AND RIGHT GREATER SAPHENOUS VEIN (N/A) TRANSESOPHAGEAL ECHOCARDIOGRAM (TEE) (N/A) ENDOVEIN HARVEST OF GREATER SAPHENOUS VEIN (Right)  POD#4 1 afeb, VSS S BP 120's-150's, PPM- V paced, can't resume ACE-I till renal fxn improved- may need other agent , control is mostly pretty good with some elevated readings 2 sats good on RA 3 voiding well, not recorded, 4 kg >preop, AKI-  BUN/creat trending down, creat this year is pretty variable between 1.1 and 1. 5 preop- cont current lasix dosing 4 HCT dropped from 29.7 to 22.9- will repeat in am  5 leukocytosis and thrombocytopenia resolved 6 CXR clear lung fields 7 on anti-seizure meds 8 Lovenox for DVT PPX 9 he's making arrangements for brother and/or sister to stay with him, poss d/c 1-2 days- cont rehab and pulm hygiene, has only walked in room      LOS: 6 days    John Giovanni PA-C Pager [phone] 08/14/2021   Patient seen and examined. Just back from walking- did well. Lymphedema left leg- Compression stocking Creatinine down to 1.5 today, pretty much his baseline  Remo Lipps C. Roxan Hockey, MD [facility] [phone]

## 2021-08-15 LAB — CBC
HCT: 31.2 % — ABNORMAL LOW (ref 39.0–52.0)
Hemoglobin: 10.5 g/dL — ABNORMAL LOW (ref 13.0–17.0)
MCH: 30.3 pg (ref 26.0–34.0)
MCHC: 33.7 g/dL (ref 30.0–36.0)
MCV: 89.9 fL (ref 80.0–100.0)
Platelets: 215 10*3/uL (ref 150–400)
RBC: 3.47 MIL/uL — ABNORMAL LOW (ref 4.22–5.81)
RDW: 13.8 % (ref 11.5–15.5)
WBC: 9 10*3/uL (ref 4.0–10.5)
nRBC: 1.6 % — ABNORMAL HIGH (ref 0.0–0.2)

## 2021-08-15 LAB — GLUCOSE, CAPILLARY
Glucose-Capillary: 111 mg/dL — ABNORMAL HIGH (ref 70–99)
Glucose-Capillary: 93 mg/dL (ref 70–99)
Glucose-Capillary: 112 mg/dL — ABNORMAL HIGH (ref 70–99)
Glucose-Capillary: 113 mg/dL — ABNORMAL HIGH (ref 70–99)

## 2021-08-15 LAB — BASIC METABOLIC PANEL
Anion gap: 9 (ref 5–15)
BUN: 35 mg/dL — ABNORMAL HIGH (ref 8–23)
CO2: 25 mmol/L (ref 22–32)
Calcium: 9.1 mg/dL (ref 8.9–10.3)
Chloride: 108 mmol/L (ref 98–111)
Creatinine, Ser: 1.75 mg/dL — ABNORMAL HIGH (ref 0.61–1.24)
GFR, Estimated: 44 mL/min — ABNORMAL LOW (ref >60)
Glucose, Bld: 114 mg/dL — ABNORMAL HIGH (ref 70–99)
Potassium: 4.3 mmol/L (ref 3.5–5.1)
Sodium: 142 mmol/L (ref 135–145)

## 2021-08-15 MED ORDER — CLOPIDOGREL BISULFATE 75 MG PO TABS
75.0000 mg | ORAL_TABLET | Freq: Every day | ORAL | Status: DC
  Filled 2021-08-15: qty 1

## 2021-08-15 MED ORDER — METOPROLOL SUCCINATE ER 50 MG PO TB24
50.0000 mg | ORAL_TABLET | Freq: Every day | ORAL | Status: DC
  Administered 2021-08-16: 50 mg via ORAL
  Filled 2021-08-15: qty 1

## 2021-08-15 MED ORDER — LISINOPRIL 5 MG PO TABS: 5.0000 mg | ORAL_TABLET | Freq: Every day | ORAL | Status: DC

## 2021-08-15 MED ORDER — LISINOPRIL 10 MG PO TABS
10.0000 mg | ORAL_TABLET | Freq: Every day | ORAL | Status: DC
Start: 2021-08-15 — End: 2021-08-16
  Administered 2021-08-15 – 2021-08-16 (×2): 10 mg via ORAL
  Filled 2021-08-15 (×2): qty 1

## 2021-08-15 NOTE — Progress Notes (Signed)
Patient has home CPAP at bedside and is able to place self on /off as needed.

## 2021-08-15 NOTE — Progress Notes (Signed)
Mobility Specialist: Progress Note   08/15/21 1106  Mobility  Activity Ambulated with assistance in hallway  Level of Assistance Standby assist, set-up cues, supervision of patient - no hands on  Assistive Device Front wheel walker  RUE Weight Bearing Weight bear through elbow only  LUE Weight Bearing Weight bear through elbow only  Activity Response Tolerated well  $Mobility charge 1 Mobility   Pre-Mobility: 60 HR Post-Mobility: 81 HR, 161/65 (90) BP, 98% SpO2  Pt received in the chair and agreeable to ambulation. Stopped x2 for standing breaks with c/o mild dizziness, otherwise asymptomatic. Pt back to chair after session with call bell in reach and OT present in the room.   [facility] Lameisha Schuenemann Mobility Specialist Mobility Specialist 5 North: [phone] Mobility Specialist 6 North: [phone]

## 2021-08-15 NOTE — Progress Notes (Signed)
Occupational Therapy Treatment Patient Details Name: Edward Pitts MRN: [id number] DOB: 03-20-1961 Today's Date: 08/15/2021   History of present illness Edward Pitts is a 61 y/o male admitted 08/05/21 with SOB, dx with NSTEMI s/p L heart cath 08/08/21 at [facility], transferred to [facility] for CABG (5/17), TEE, Endovein harvest of greater saphenous vein. Vent support from 08/10/21-08/11/21. 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   OT comments  Patient seen following mobility walking with him in hallway. Patient stated he was fatigued but willing to work with OT for AE training. Patient educated on hip kit and contentment. Demonstration provided on reacher, sock aide, and dressing stick use for LB dressing. Patient was able to return demonstration with min assist and verbal cues. Patient making good progress with OT and would benefit from further OT with Edward Pitts. Acute OT to continue to follow.    Recommendations for follow up therapy are one component of a multi-disciplinary discharge planning process, led by the attending physician.  Recommendations may be updated based on patient status, additional functional criteria and insurance authorization.    Follow Up Recommendations  Home health OT    Assistance Recommended at Discharge Intermittent Supervision/Assistance  Patient can return home with the following  A little help with walking and/or transfers;A lot of help with bathing/dressing/bathroom;Assistance with cooking/housework;Assist for transportation;Help with stairs or ramp for entrance   Equipment Recommendations  BSC/3in1 (to be used as a shower chair)    Recommendations for Other  Services      Precautions / Restrictions Precautions Precautions: Sternal Precaution Booklet Issued: Yes (comment) Restrictions Weight Bearing Restrictions: Yes RUE Weight Bearing: Weight bear through elbow only LUE Weight Bearing: Weight bear through elbow only Other Position/Activity Restrictions: sternal       Mobility Bed Mobility Overal bed mobility: Needs Assistance             General bed mobility comments: up in recliner    Transfers Overall transfer level: Needs assistance                 General transfer comment: focused on AE training     Balance Overall balance assessment: Needs assistance Sitting-balance support: Feet supported, No upper extremity supported Sitting balance-Edward Pitts: Good                                     ADL either performed or assessed with clinical judgement   ADL Overall ADL's : Needs assistance/impaired                     Lower Body Dressing: Moderate assistance;With adaptive equipment Lower Body Dressing Details (indicate cue type and reason): AE training for LB dressing               General ADL Comments: focused on AE training for LB dressing    Extremity/Trunk Assessment              Vision       Perception     Praxis      Cognition Arousal/Alertness: Awake/alert Behavior During Therapy: WFL for tasks assessed/performed, Flat affect Overall Cognitive Status: Within  Functional Limits for tasks assessed                                 General Comments: good understanding of AE use        Exercises      Shoulder Instructions       General Comments VSS on RA.    Pertinent Vitals/ Pain       Pain Assessment Pain Assessment: Faces Faces Pain Pitts: Hurts a little bit Pain Location: sternal Pain Descriptors / Indicators: Grimacing Pain Intervention(s): Monitored during session  Home Living                                           Prior Functioning/Environment              Frequency  Min 2X/week        Progress Toward Goals  OT Goals(current goals can now be found in the care plan section)  Progress towards OT goals: Progressing toward goals  Acute Rehab OT Goals Patient Stated Goal: go home OT Goal Formulation: With patient Time For Goal Achievement: 08/27/21 Potential to Achieve Goals: Good ADL Goals Pt Will Perform Grooming: with modified independence;standing Pt Will Perform Upper Body Bathing: with modified independence;sitting;with adaptive equipment Pt Will Perform Lower Body Bathing: with modified independence;sit to/from stand;with adaptive equipment Pt Will Perform Upper Body Dressing: with modified independence;sitting Pt Will Perform Lower Body Dressing: with modified independence;with adaptive equipment;sit to/from stand Pt Will Transfer to Toilet: with modified independence;ambulating Pt Will Perform Toileting - Clothing Manipulation and hygiene: with modified independence;sit to/from stand Additional ADL Goal #1: Pt will maintain sternal precautions throughout ADL routine with no cues.  Plan Discharge plan remains appropriate    Co-evaluation                 AM-PAC OT "6 Clicks" Daily Activity     Outcome Measure   Help from another person eating meals?: None Help from another person taking care of personal grooming?: A Little Help from another person toileting, which includes using toliet, bedpan, or urinal?: A Little Help from another person bathing (including washing, rinsing, drying)?: A Lot Help from another person to put on and taking off regular upper body clothing?: A Lot Help from another person to put on and taking off regular lower body clothing?: A Lot 6 Click Score: 16    End of Session    OT Visit Diagnosis: Unsteadiness on feet (R26.81);Muscle weakness (generalized) (M62.81)   Activity Tolerance Patient tolerated treatment well   Patient Left  in chair;with call bell/phone within reach   Nurse Communication Mobility status        Time: 1050-1120 OT Time Calculation (min): 30 min  Charges: OT General Charges $OT Visit: 1 Visit OT Treatments $Self Care/Home Management : 23-37 mins  Lodema Hong, [place]  Pager [phone] Office [place] 08/15/2021, 12:23 PM

## 2021-08-15 NOTE — Progress Notes (Signed)
CARDIAC REHAB PHASE I   Offered to walk with pt. Pt took two long walks today and worked with OT, states some fatigue. Pt requesting walker for home use, CM aware. Pt states he has help at home. Encouraged a third walk before bed tonight. Will continue to follow.  [phone] Rufina Falco, RN BSN 08/15/2021 1:16 PM

## 2021-08-15 NOTE — Plan of Care (Signed)

## 2021-08-15 NOTE — Progress Notes (Addendum)
      [address]       [place] [address]             [phone]      5 Days Post-Op Procedure(s) (LRB): CORONARY ARTERY BYPASS GRAFTING (CABG) X 3 USING LEFT INTERNAL MAMMARY ARTERY AND RIGHT GREATER SAPHENOUS VEIN (N/A) TRANSESOPHAGEAL ECHOCARDIOGRAM (TEE) (N/A) ENDOVEIN HARVEST OF GREATER SAPHENOUS VEIN (Right)  Subjective:  Patient is w/o specific complaints.  + ambulation w/o difficulty.  + BM  Objective: Vital signs in last 24 hours: Temp:  [97.5 F (36.4 C)-98.2 F (36.8 C)] 98.2 F (36.8 C) (05/22 0725) Pulse Rate:  [60-64] 60 (05/22 0725) Cardiac Rhythm: Atrial flutter;Ventricular paced (05/22 0721) Resp:  [14-25] 16 (05/22 0725) BP: (127-143)/(43-68) 139/61 (05/22 0725) SpO2:  [94 %-97 %] 95 % (05/22 0725) Weight:  [109.8 kg] 109.8 kg (05/22 0343)  General appearance: alert, cooperative, and no distress Heart: regular rate and rhythm Lungs: clear to auscultation bilaterally Abdomen: soft, non-tender; bowel sounds normal; no masses,  no organomegaly Extremities: edema trace Wound: clean and dry  Lab Results: Recent Labs    08/14/21 0351 08/15/21 0605  WBC 9.8 9.0  HGB 8.3* 10.5*  HCT 22.9* 31.2*  PLT 180 215   BMET:  Recent Labs    08/13/21 0210 08/14/21 0351  NA 136 138  K 5.1 4.7  CL 107 107  CO2 19* 21*  GLUCOSE 101* 111*  BUN 43* 40*  CREATININE 1.63* 1.51*  CALCIUM 9.1 8.9    PT/INR: No results for input(s): LABPROT, INR in the last 72 hours. ABG    Component Value Date/Time   PHART 7.370 08/11/2021 0842   HCO3 16.0 (L) 08/11/2021 0842   TCO2 17 (L) 08/11/2021 0842   ACIDBASEDEF 8.0 (H) 08/11/2021 0842   O2SAT 58.7 08/12/2021 0219   CBG (last 3)  Recent Labs    08/14/21 1634 08/14/21 2115 08/15/21 0610  GLUCAP 142* 122* 111*    Assessment/Plan: S/P Procedure(s) (LRB): CORONARY ARTERY BYPASS GRAFTING (CABG) X 3 USING LEFT INTERNAL MAMMARY ARTERY AND RIGHT GREATER SAPHENOUS VEIN (N/A) TRANSESOPHAGEAL  ECHOCARDIOGRAM (TEE) (N/A) ENDOVEIN HARVEST OF GREATER SAPHENOUS VEIN (Right)  CV- NSR, BP elevated at times- continue Lopressor, monitor BP Pulm- no acute issues, off oxygen continue Renal- AKI, creatinine peaked at 2.04, is down to 1.75 this morning, will stop Lasix, potassium Expected post operative blood loss anemia, mild Hgb at 10.5 CBGs controlled, will stop all SSIP Dispo- patient stable, creatinine at 1.75, stop diuretics today, monitor BP, will discuss pacing wire removal with [facility], plan for d/c in AM if labs remain stable   LOS: 7 days    Ellwood Handler, PA-C 08/15/2021 Remove EpW's , stop lasix Check bmp in am for creat 1.75- if stable plan DC home in am on plavix/ 41 Asa  patient examined and medical record reviewed,agree with above note. Dahlia Byes 08/15/2021

## 2021-08-15 NOTE — Progress Notes (Signed)
Physical Therapy Treatment Patient Details Name: Edward Pitts MRN: [id number] DOB: 03-Jan-1961 Today's Date: 08/15/2021   History of Present Illness Edward Pitts is a 61 y/o male admitted 08/05/21 with SOB, dx with NSTEMI s/p L heart cath 08/08/21 at [facility], transferred to [facility] for CABG (5/17), TEE, Endovein harvest of greater saphenous vein. Vent support from 08/10/21-08/11/21. 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    PT Comments    Patient progressing well towards PT goals. Reports feeling good with no pain today. Session focused on gait progression and functional transfers while adhering to sternal precautions. VSS on RA with pt needing 3 standing rest breaks due to SOB and fatigue. Will focus on practicing bed mobility with HOB flat to simulate home next session as pt using rail this session despite cues. Discussed energy conservation techniques and importance of having dogs locked in room and have pt sitting down prior to seeing them so they do no trip him or cause him to fall. Will follow.    Recommendations for follow up therapy are one component of a multi-disciplinary discharge planning process, led by the attending physician.  Recommendations may be updated based on patient status, additional functional criteria and insurance authorization.  Follow Up Recommendations  Home health PT     Assistance Recommended at Discharge Intermittent Supervision/Assistance  Patient can return home with the following A little help with walking and/or transfers;A little help with bathing/dressing/bathroom;Assistance with cooking/housework;Assist for transportation;Help with stairs or ramp  for entrance   Equipment Recommendations  Rolling walker (2 wheels) (to check if he has one at home)    Recommendations for Other Services       Precautions / Restrictions Precautions Precautions: Sternal Precaution Booklet Issued: Yes (comment) Restrictions Weight Bearing Restrictions: Yes Other Position/Activity Restrictions: sternal     Mobility  Bed Mobility Overal bed mobility: Needs Assistance Bed Mobility: Rolling, Sidelying to Sit Rolling: Supervision Sidelying to sit: Supervision, HOB elevated       General bed mobility comments: Using rail to pull self up desipte cues to adhere to sternal precautions.    Transfers Overall transfer level: Needs assistance Equipment used: Rolling walker (2 wheels) Transfers: Sit to/from Stand Sit to Stand: Min guard           General transfer comment: Min guard for safety. Stood from [employer], from chair x5 with cues to adhere to sternal precautions.    Ambulation/Gait Ambulation/Gait assistance: Supervision Gait Distance (Feet): 500 Feet Assistive device: Rolling walker (2 wheels) Gait Pattern/deviations: Step-through pattern, Decreased stride length Gait velocity: 1.20 Gait velocity interpretation: <1.31 ft/sec, indicative of household ambulator   General Gait Details: Slow, mostly steady gait wtih 3 standing rest breaks. 2/4 DOE. HR ranging from 70-85 bpm. Sp02 high 90s on RA.   Stairs             Wheelchair Mobility    Modified Rankin (Stroke Patients Only)       Balance Overall balance assessment: Needs assistance Sitting-balance support: Feet supported, No upper extremity supported Sitting balance-Leahy Scale: Good     Standing balance support: During functional activity Standing balance-Leahy Scale: Fair Standing balance comment: Able to stand statically wihtout UE support, walks in room with  at least 1 UE support, does better with BUE support for hallway walking                             Cognition Arousal/Alertness: Awake/alert Behavior During Therapy: WFL for tasks assessed/performed, Flat affect Overall Cognitive Status: Within Functional Limits for tasks assessed                                 General Comments: Good sense of humor, very flat. Needs cues to adhere to sternal precautions, "I am just using it to steady myself, despitre pulling on bed rail"        Exercises Other Exercises Other Exercises: sit to stand x5 from low chair with cues for slow descent and to adhere to sternal precautions    General Comments General comments (skin integrity, edema, etc.): VSS on RA.      Pertinent Vitals/Pain Pain Assessment Pain Assessment: No/denies pain    Home Living                          Prior Function            PT Goals (current goals can now be found in the care plan section) Progress towards PT goals: Progressing toward goals    Frequency    Min 3X/week      PT Plan Current plan remains appropriate    Co-evaluation              AM-PAC PT "6 Clicks" Mobility   Outcome Measure  Help needed turning from your back to your side while in a flat bed without using bedrails?: A Little Help needed moving from lying on your back to sitting on the side of a flat bed without using bedrails?: A Little Help needed moving to and from a bed to a chair (including a wheelchair)?: A Little Help needed standing up from a chair using your arms (e.g., wheelchair or bedside chair)?: A Little Help needed to walk in hospital room?: A Little Help needed climbing 3-5 steps with a railing? : A Little 6 Click Score: 18    End of Session Equipment Utilized During Treatment: Gait belt Activity Tolerance: Patient tolerated treatment well Patient left: in chair;with call bell/phone within reach Nurse Communication: Mobility status PT Visit Diagnosis: Muscle weakness (generalized) (M62.81);Difficulty in walking, not elsewhere  classified (R26.2)     Time: 7124-5809 PT Time Calculation (min) (ACUTE ONLY): 28 min  Charges:  $Gait Training: 8-22 mins $Therapeutic Activity: 8-22 mins                     Marisa Severin, PT, DPT Acute Rehabilitation Services Secure chat preferred Office [place] 08/15/2021, 9:52 AM

## 2021-08-15 NOTE — TOC Progression Note (Signed)
Transition of Care (TOC) - Progression Note  Marvetta Gibbons RN, BSN Transitions of Care Unit 4E- RN Case Manager See Treatment Team for direct phone #    Patient Details  Name: Edward Pitts MRN: [id number] Date of Birth: 1960/08/06  Transition of Care [facility]) CM/SW Contact  Dahlia Client, Romeo Rabon, RN Phone Number: 08/15/2021, 2:37 PM  Clinical Narrative:    Order for HHPT placed, CM spoke with pt at bedside, pt agreeable to [facility] services. List provided for [facility] choice- per pt he has selected [facility] as first choice with [facility] as backup choice. Also discussed DME, per cardiac rehab pt needs RW for home. Pt confirmed he would like RW, and agreeable to in house provider.   Family to transport home,  Address, phone #s, PCP all confirmed in epic.   Call made to [facility] with [facility] for [place] referral- referral has been accepted.   Call made to [employer] for DME need- RW to be delivered to room prior to discharge.    Expected Discharge Plan: [place] Barriers to Discharge: Continued Medical Work up  Expected Discharge Plan and Services Expected Discharge Plan: [place] In-house Referral: NA Discharge Planning Services: CM Consult Post Acute Care Choice: Durable Medical Equipment, Home Health Living arrangements for the past 2 months: Single Family Home                 DME Arranged: Walker rolling DME Agency: [facility] Date DME Agency Contacted: 08/15/21 Time DME Agency Contacted: [phone] Representative spoke with at DME Agency: Mardene Celeste HH Arranged: PT [place]: [place] Date [place]: 08/15/21 Time [place]: 1436 Representative spoke with at [place]: [place] ([place]) Interventions    Readmission Risk Interventions    08/15/2021    2:37 PM 08/12/2021   10:56 AM  Readmission Risk Prevention Plan  Transportation Screening Complete Complete  PCP or Specialist Appt within 5-7 Days  Complete   PCP or Specialist Appt within 3-5 Days  Complete  Home Care Screening Complete   Medication Review (RN CM) Complete   HRI or Home Care Consult  Complete  Social Work Consult for [place] Planning/Counseling  Complete  Palliative Care Screening  Not Applicable  Medication Review Press photographer)  Complete

## 2021-08-16 LAB — BASIC METABOLIC PANEL
Anion gap: 10 (ref 5–15)
BUN: 33 mg/dL — ABNORMAL HIGH (ref 8–23)
CO2: 23 mmol/L (ref 22–32)
Calcium: 8.9 mg/dL (ref 8.9–10.3)
Chloride: 105 mmol/L (ref 98–111)
Creatinine, Ser: 1.55 mg/dL — ABNORMAL HIGH (ref 0.61–1.24)
GFR, Estimated: 51 mL/min — ABNORMAL LOW (ref >60)
Glucose, Bld: 121 mg/dL — ABNORMAL HIGH (ref 70–99)
Potassium: 4.3 mmol/L (ref 3.5–5.1)
Sodium: 138 mmol/L (ref 135–145)

## 2021-08-16 LAB — GLUCOSE, CAPILLARY
Glucose-Capillary: 129 mg/dL — ABNORMAL HIGH (ref 70–99)
Glucose-Capillary: 123 mg/dL — ABNORMAL HIGH (ref 70–99)

## 2021-08-16 MED ORDER — APIXABAN 5 MG PO TABS: 5.0000 mg | ORAL_TABLET | Freq: Two times a day (BID) | ORAL | 3 refills | Status: DC

## 2021-08-16 MED ORDER — TRAMADOL HCL 50 MG PO TABS: 50.0000 mg | ORAL_TABLET | Freq: Four times a day (QID) | ORAL | 0 refills | Status: DC | PRN

## 2021-08-16 MED ORDER — ACETAMINOPHEN 500 MG PO TABS: 500.0000 mg | ORAL_TABLET | Freq: Four times a day (QID) | ORAL | 0 refills | Status: DC | PRN

## 2021-08-16 MED ORDER — APIXABAN 5 MG PO TABS
5.0000 mg | ORAL_TABLET | Freq: Two times a day (BID) | ORAL | Status: DC
  Administered 2021-08-16: 5 mg via ORAL
  Filled 2021-08-16: qty 1

## 2021-08-16 MED ORDER — AMIODARONE HCL 200 MG PO TABS
400.0000 mg | ORAL_TABLET | Freq: Two times a day (BID) | ORAL | Status: DC
  Administered 2021-08-16: 400 mg via ORAL
  Filled 2021-08-16: qty 2

## 2021-08-16 MED ORDER — AMIODARONE HCL 200 MG PO TABS: 400.0000 mg | ORAL_TABLET | Freq: Two times a day (BID) | ORAL | 1 refills | Status: DC

## 2021-08-16 NOTE — TOC Transition Note (Signed)
Transition of Care (TOC) - CM/SW Discharge Note Marvetta Gibbons RN, BSN Transitions of Care Unit 4E- RN Case Manager See Treatment Team for direct phone #    Patient Details  Name: Edward Pitts MRN: [id number] Date of Birth: 05/29/1960  Transition of Care [facility]) CM/SW Contact:  Dawayne Patricia, RN Phone Number: 08/16/2021, 10:51 AM   Clinical Narrative:    Pt stable for transition home today, [place] and DME have been arranged. [place] set up with Alvis Lemmings DME- RW set up with [employer]- called for delivery on 5/22  Pt has transportation home, no further TOC needs noted.    Final next level of care: [place] Barriers to Discharge: Barriers Resolved   Patient Goals and CMS Choice Patient states their goals for this hospitalization and ongoing recovery are:: return home CMS Medicare.gov Compare Post Acute Care list provided to:: Patient Choice offered to / list presented to : Patient  Discharge Placement                 Home w/ [facility]      Discharge Plan and Services In-house Referral: NA Discharge Planning Services: CM Consult Post Acute Care Choice: Durable Medical Equipment, Home Health          DME Arranged: Walker rolling DME Agency: [facility] Date DME Agency Contacted: 08/15/21 Time DME Agency Contacted: [phone] Representative spoke with at DME Agency: Mardene Celeste HH Arranged: PT [place]: [place] Date [place]: 08/15/21 Time [place]: 1436 Representative spoke with at [place]: [place] ([place]) Interventions     Readmission Risk Interventions    08/15/2021    2:37 PM 08/12/2021   10:56 AM  Readmission Risk Prevention Plan  Transportation Screening Complete Complete  PCP or Specialist Appt within 5-7 Days Complete   PCP or Specialist Appt within 3-5 Days  Complete  Home Care Screening Complete   Medication Review (RN CM) Complete   HRI or Home Care Consult  Complete  Social Work  Consult for [place] Planning/Counseling  Complete  Palliative Care Screening  Not Applicable  Medication Review Press photographer)  Complete

## 2021-08-16 NOTE — Progress Notes (Signed)
Seen pt from 1003- , pt declines ambulation b/c he walked in the hallway before we arrived. Pt received procedure, sternal precautions, restrictions, site care, nutrition, IS encouragement, and exercise guidelines. Pt was referred to [facility] in [place]. Pt questions were answered prior to leaving.   Christen Bame  08/16/2021 10:30 AM

## 2021-08-16 NOTE — Progress Notes (Signed)
Pt to be d/c from 4E to home. D/c information and med education provided. IV's and tele removed, CCMD notified.   Raelyn Number, RN

## 2021-08-16 NOTE — Progress Notes (Addendum)
      [address]       [place], [address]             [phone]      6 Days Post-Op Procedure(s) (LRB): CORONARY ARTERY BYPASS GRAFTING (CABG) X 3 USING LEFT INTERNAL MAMMARY ARTERY AND RIGHT GREATER SAPHENOUS VEIN (N/A) TRANSESOPHAGEAL ECHOCARDIOGRAM (TEE) (N/A) ENDOVEIN HARVEST OF GREATER SAPHENOUS VEIN (Right)  Subjective:  Patient sitting up in chair.  He has no complaints and wants to go home.  Objective: Vital signs in last 24 hours: Temp:  [97.7 F (36.5 C)-98.1 F (36.7 C)] 98.1 F (36.7 C) (05/23 0328) Pulse Rate:  [60-74] 63 (05/23 0328) Cardiac Rhythm: Ventricular paced (05/23 0335) Resp:  [11-20] 18 (05/23 0328) BP: (113-161)/(54-66) 120/54 (05/23 0328) SpO2:  [93 %-98 %] 93 % (05/23 0328) Weight:  [108.8 kg] 108.8 kg (05/23 0418)  Intake/Output from previous day: 05/22 0701 - 05/23 0700 In: 880 [P.O.:880] Out: 0   General appearance: alert, cooperative, and no distress Heart: regular rate and rhythm Lungs: clear to auscultation bilaterally Abdomen: soft, non-tender; bowel sounds normal; no masses,  no organomegaly Extremities: edema trace Wound: clean and dry  Lab Results: Recent Labs    08/14/21 0351 08/15/21 0605  WBC 9.8 9.0  HGB 8.3* 10.5*  HCT 22.9* 31.2*  PLT 180 215   BMET:  Recent Labs    08/14/21 0351 08/15/21 0605  NA 138 142  K 4.7 4.3  CL 107 108  CO2 21* 25  GLUCOSE 111* 114*  BUN 40* 35*  CREATININE 1.51* 1.75*  CALCIUM 8.9 9.1    PT/INR: No results for input(s): LABPROT, INR in the last 72 hours. ABG    Component Value Date/Time   PHART 7.370 08/11/2021 0842   HCO3 16.0 (L) 08/11/2021 0842   TCO2 17 (L) 08/11/2021 0842   ACIDBASEDEF 8.0 (H) 08/11/2021 0842   O2SAT 58.7 08/12/2021 0219   CBG (last 3)  Recent Labs    08/15/21 1719 08/15/21 2054 08/16/21 0616  GLUCAP 112* 113* 129*    Assessment/Plan: S/P Procedure(s) (LRB): CORONARY ARTERY BYPASS GRAFTING (CABG) X 3 USING LEFT  INTERNAL MAMMARY ARTERY AND RIGHT GREATER SAPHENOUS VEIN (N/A) TRANSESOPHAGEAL ECHOCARDIOGRAM (TEE) (N/A) ENDOVEIN HARVEST OF GREATER SAPHENOUS VEIN (Right)  CV- Atrial Flutter, PPM in place with pacing- on Toprol XL, HTN on Lisinopril Pulm- no acute issues, continue IS Renal- repeat BMET is pending Dispo- patient with Atrial Flutter continue Toprol XL, Lisinopril for HTN, awaiting BMET result to ensure creatinine is stable, will plan for d/c this afternoon  As discussed with Dr. Prescott Gum will add Amiodarone, stop Plavix and start Eliquis with Atrial flutter.  Addendum: creatinine has improved to baseline at 1.55, will d/c home today   LOS: 8 days    Ellwood Handler, PA-C 08/16/2021

## 2021-08-16 NOTE — Plan of Care (Signed)
  Problem: Education: Goal: Knowledge of General Education information will improve Description: Including pain rating scale, medication(s)/side effects and non-pharmacologic comfort measures Outcome: Adequate for Discharge   Problem: Health Behavior/Discharge Planning: Goal: Ability to manage health-related needs will improve Outcome: Adequate for Discharge   Problem: Clinical Measurements: Goal: Ability to maintain clinical measurements within normal limits will improve Outcome: Adequate for Discharge Goal: Will remain free from infection Outcome: Adequate for Discharge Goal: Diagnostic test results will improve Outcome: Adequate for Discharge Goal: Respiratory complications will improve Outcome: Adequate for Discharge Goal: Cardiovascular complication will be avoided Outcome: Adequate for Discharge   Problem: Nutrition: Goal: Adequate nutrition will be maintained Outcome: Adequate for Discharge   Problem: Elimination: Goal: Will not experience complications related to bowel motility Outcome: Adequate for Discharge Goal: Will not experience complications related to urinary retention Outcome: Adequate for Discharge   Problem: Pain Managment: Goal: General experience of comfort will improve Outcome: Adequate for Discharge   Problem: Safety: Goal: Ability to remain free from injury will improve Outcome: Adequate for Discharge   Problem: Skin Integrity: Goal: Risk for impaired skin integrity will decrease Outcome: Adequate for Discharge   Problem: Education: Goal: Will demonstrate proper wound care and an understanding of methods to prevent future damage Outcome: Adequate for Discharge Goal: Knowledge of disease or condition will improve Outcome: Adequate for Discharge Goal: Knowledge of the prescribed therapeutic regimen will improve Outcome: Adequate for Discharge Goal: Individualized Educational Video(s) Outcome: Adequate for Discharge   Problem: Activity: Goal:  Risk for activity intolerance will decrease Outcome: Adequate for Discharge   Problem: Cardiac: Goal: Will achieve and/or maintain hemodynamic stability Outcome: Adequate for Discharge   Problem: Clinical Measurements: Goal: Postoperative complications will be avoided or minimized Outcome: Adequate for Discharge   Problem: Respiratory: Goal: Respiratory status will improve Outcome: Adequate for Discharge   Problem: Skin Integrity: Goal: Wound healing without signs and symptoms of infection Outcome: Adequate for Discharge Goal: Risk for impaired skin integrity will decrease Outcome: Adequate for Discharge   Problem: Urinary Elimination: Goal: Ability to achieve and maintain adequate renal perfusion and functioning will improve Outcome: Adequate for Discharge

## 2021-08-16 NOTE — Progress Notes (Signed)
Mobility Specialist: Progress Note   08/16/21 1203  Mobility  Activity Ambulated with assistance in hallway  Level of Assistance Modified independent, requires aide device or extra time  Assistive Device Front wheel walker  RUE Weight Bearing Weight bear through elbow only  LUE Weight Bearing Weight bear through elbow only  Distance Ambulated (ft) 470 ft  Activity Response Tolerated well  $Mobility charge 1 Mobility   Received pt in chair having no complaints and agreeable to mobility. Asymptomatic throughout ambulation, returned back to chair w/ call bell in reach and all needs met.  [facility] Harshan Kearley Mobility Specialist Mobility Specialist 5 North: [phone] Mobility Specialist 6 North: [phone]

## 2021-08-18 DIAGNOSIS — Z48812 Encounter for surgical aftercare following surgery on the circulatory system: Secondary | ICD-10-CM | POA: Diagnosis not present

## 2021-08-19 ENCOUNTER — Other Ambulatory Visit: Payer: Self-pay | Admitting: Oncology

## 2021-08-22 ENCOUNTER — Other Ambulatory Visit: Payer: Self-pay | Admitting: Internal Medicine

## 2021-08-22 DIAGNOSIS — R569 Unspecified convulsions: Secondary | ICD-10-CM

## 2021-08-23 ENCOUNTER — Encounter: Payer: Self-pay | Admitting: Oncology

## 2021-08-26 DIAGNOSIS — E785 Hyperlipidemia, unspecified: Secondary | ICD-10-CM | POA: Diagnosis not present

## 2021-08-26 DIAGNOSIS — Z7982 Long term (current) use of aspirin: Secondary | ICD-10-CM | POA: Diagnosis not present

## 2021-08-26 DIAGNOSIS — G4733 Obstructive sleep apnea (adult) (pediatric): Secondary | ICD-10-CM | POA: Diagnosis not present

## 2021-08-26 DIAGNOSIS — Z9981 Dependence on supplemental oxygen: Secondary | ICD-10-CM | POA: Diagnosis not present

## 2021-08-26 DIAGNOSIS — Z951 Presence of aortocoronary bypass graft: Secondary | ICD-10-CM | POA: Diagnosis not present

## 2021-08-26 DIAGNOSIS — I214 Non-ST elevation (NSTEMI) myocardial infarction: Secondary | ICD-10-CM | POA: Diagnosis not present

## 2021-08-26 DIAGNOSIS — Z7901 Long term (current) use of anticoagulants: Secondary | ICD-10-CM | POA: Diagnosis not present

## 2021-08-26 DIAGNOSIS — N1831 Chronic kidney disease, stage 3a: Secondary | ICD-10-CM | POA: Diagnosis not present

## 2021-08-26 DIAGNOSIS — I129 Hypertensive chronic kidney disease with stage 1 through stage 4 chronic kidney disease, or unspecified chronic kidney disease: Secondary | ICD-10-CM | POA: Diagnosis not present

## 2021-08-26 DIAGNOSIS — I890 Lymphedema, not elsewhere classified: Secondary | ICD-10-CM | POA: Diagnosis not present

## 2021-08-26 DIAGNOSIS — Z950 Presence of cardiac pacemaker: Secondary | ICD-10-CM | POA: Diagnosis not present

## 2021-08-26 DIAGNOSIS — K219 Gastro-esophageal reflux disease without esophagitis: Secondary | ICD-10-CM | POA: Diagnosis not present

## 2021-08-26 DIAGNOSIS — Z8589 Personal history of malignant neoplasm of other organs and systems: Secondary | ICD-10-CM | POA: Diagnosis not present

## 2021-08-26 DIAGNOSIS — Z85820 Personal history of malignant melanoma of skin: Secondary | ICD-10-CM | POA: Diagnosis not present

## 2021-08-26 DIAGNOSIS — Z48812 Encounter for surgical aftercare following surgery on the circulatory system: Secondary | ICD-10-CM | POA: Diagnosis not present

## 2021-08-26 DIAGNOSIS — I2510 Atherosclerotic heart disease of native coronary artery without angina pectoris: Secondary | ICD-10-CM | POA: Diagnosis not present

## 2021-08-26 DIAGNOSIS — I442 Atrioventricular block, complete: Secondary | ICD-10-CM | POA: Diagnosis not present

## 2021-08-26 DIAGNOSIS — D62 Acute posthemorrhagic anemia: Secondary | ICD-10-CM | POA: Diagnosis not present

## 2021-08-26 DIAGNOSIS — Z9181 History of falling: Secondary | ICD-10-CM | POA: Diagnosis not present

## 2021-08-27 DIAGNOSIS — Z9181 History of falling: Secondary | ICD-10-CM | POA: Diagnosis not present

## 2021-08-27 DIAGNOSIS — N1831 Chronic kidney disease, stage 3a: Secondary | ICD-10-CM | POA: Diagnosis not present

## 2021-08-27 DIAGNOSIS — I214 Non-ST elevation (NSTEMI) myocardial infarction: Secondary | ICD-10-CM | POA: Diagnosis not present

## 2021-08-27 DIAGNOSIS — E785 Hyperlipidemia, unspecified: Secondary | ICD-10-CM | POA: Diagnosis not present

## 2021-08-27 DIAGNOSIS — I2510 Atherosclerotic heart disease of native coronary artery without angina pectoris: Secondary | ICD-10-CM | POA: Diagnosis not present

## 2021-08-27 DIAGNOSIS — Z7901 Long term (current) use of anticoagulants: Secondary | ICD-10-CM | POA: Diagnosis not present

## 2021-08-27 DIAGNOSIS — K219 Gastro-esophageal reflux disease without esophagitis: Secondary | ICD-10-CM | POA: Diagnosis not present

## 2021-08-27 DIAGNOSIS — Z951 Presence of aortocoronary bypass graft: Secondary | ICD-10-CM | POA: Diagnosis not present

## 2021-08-27 DIAGNOSIS — Z48812 Encounter for surgical aftercare following surgery on the circulatory system: Secondary | ICD-10-CM | POA: Diagnosis not present

## 2021-08-27 DIAGNOSIS — Z950 Presence of cardiac pacemaker: Secondary | ICD-10-CM | POA: Diagnosis not present

## 2021-08-27 DIAGNOSIS — Z8589 Personal history of malignant neoplasm of other organs and systems: Secondary | ICD-10-CM | POA: Diagnosis not present

## 2021-08-27 DIAGNOSIS — I129 Hypertensive chronic kidney disease with stage 1 through stage 4 chronic kidney disease, or unspecified chronic kidney disease: Secondary | ICD-10-CM | POA: Diagnosis not present

## 2021-08-27 DIAGNOSIS — I890 Lymphedema, not elsewhere classified: Secondary | ICD-10-CM | POA: Diagnosis not present

## 2021-08-27 DIAGNOSIS — Z9981 Dependence on supplemental oxygen: Secondary | ICD-10-CM | POA: Diagnosis not present

## 2021-08-27 DIAGNOSIS — D62 Acute posthemorrhagic anemia: Secondary | ICD-10-CM | POA: Diagnosis not present

## 2021-08-27 DIAGNOSIS — Z7982 Long term (current) use of aspirin: Secondary | ICD-10-CM | POA: Diagnosis not present

## 2021-08-27 DIAGNOSIS — Z85820 Personal history of malignant melanoma of skin: Secondary | ICD-10-CM | POA: Diagnosis not present

## 2021-08-27 DIAGNOSIS — G4733 Obstructive sleep apnea (adult) (pediatric): Secondary | ICD-10-CM | POA: Diagnosis not present

## 2021-08-27 DIAGNOSIS — I442 Atrioventricular block, complete: Secondary | ICD-10-CM | POA: Diagnosis not present

## 2021-08-29 DIAGNOSIS — Z951 Presence of aortocoronary bypass graft: Secondary | ICD-10-CM | POA: Diagnosis not present

## 2021-08-29 DIAGNOSIS — I2510 Atherosclerotic heart disease of native coronary artery without angina pectoris: Secondary | ICD-10-CM | POA: Diagnosis not present

## 2021-08-29 DIAGNOSIS — I10 Essential (primary) hypertension: Secondary | ICD-10-CM | POA: Diagnosis not present

## 2021-08-29 DIAGNOSIS — R001 Bradycardia, unspecified: Secondary | ICD-10-CM | POA: Diagnosis not present

## 2021-08-29 DIAGNOSIS — I442 Atrioventricular block, complete: Secondary | ICD-10-CM | POA: Diagnosis not present

## 2021-08-29 DIAGNOSIS — I214 Non-ST elevation (NSTEMI) myocardial infarction: Secondary | ICD-10-CM | POA: Diagnosis not present

## 2021-08-30 DIAGNOSIS — D62 Acute posthemorrhagic anemia: Secondary | ICD-10-CM | POA: Diagnosis not present

## 2021-08-30 DIAGNOSIS — E785 Hyperlipidemia, unspecified: Secondary | ICD-10-CM | POA: Diagnosis not present

## 2021-08-30 DIAGNOSIS — Z950 Presence of cardiac pacemaker: Secondary | ICD-10-CM | POA: Diagnosis not present

## 2021-08-30 DIAGNOSIS — Z9181 History of falling: Secondary | ICD-10-CM | POA: Diagnosis not present

## 2021-08-30 DIAGNOSIS — Z7982 Long term (current) use of aspirin: Secondary | ICD-10-CM | POA: Diagnosis not present

## 2021-08-30 DIAGNOSIS — Z7901 Long term (current) use of anticoagulants: Secondary | ICD-10-CM | POA: Diagnosis not present

## 2021-08-30 DIAGNOSIS — I129 Hypertensive chronic kidney disease with stage 1 through stage 4 chronic kidney disease, or unspecified chronic kidney disease: Secondary | ICD-10-CM | POA: Diagnosis not present

## 2021-08-30 DIAGNOSIS — I214 Non-ST elevation (NSTEMI) myocardial infarction: Secondary | ICD-10-CM | POA: Diagnosis not present

## 2021-08-30 DIAGNOSIS — I2510 Atherosclerotic heart disease of native coronary artery without angina pectoris: Secondary | ICD-10-CM | POA: Diagnosis not present

## 2021-08-30 DIAGNOSIS — K219 Gastro-esophageal reflux disease without esophagitis: Secondary | ICD-10-CM | POA: Diagnosis not present

## 2021-08-30 DIAGNOSIS — Z85820 Personal history of malignant melanoma of skin: Secondary | ICD-10-CM | POA: Diagnosis not present

## 2021-08-30 DIAGNOSIS — I890 Lymphedema, not elsewhere classified: Secondary | ICD-10-CM | POA: Diagnosis not present

## 2021-08-30 DIAGNOSIS — G4733 Obstructive sleep apnea (adult) (pediatric): Secondary | ICD-10-CM | POA: Diagnosis not present

## 2021-08-30 DIAGNOSIS — Z9981 Dependence on supplemental oxygen: Secondary | ICD-10-CM | POA: Diagnosis not present

## 2021-08-30 DIAGNOSIS — I442 Atrioventricular block, complete: Secondary | ICD-10-CM | POA: Diagnosis not present

## 2021-08-30 DIAGNOSIS — Z48812 Encounter for surgical aftercare following surgery on the circulatory system: Secondary | ICD-10-CM | POA: Diagnosis not present

## 2021-08-30 DIAGNOSIS — Z951 Presence of aortocoronary bypass graft: Secondary | ICD-10-CM | POA: Diagnosis not present

## 2021-08-30 DIAGNOSIS — N1831 Chronic kidney disease, stage 3a: Secondary | ICD-10-CM | POA: Diagnosis not present

## 2021-08-30 DIAGNOSIS — Z8589 Personal history of malignant neoplasm of other organs and systems: Secondary | ICD-10-CM | POA: Diagnosis not present

## 2021-09-01 DIAGNOSIS — H40013 Open angle with borderline findings, low risk, bilateral: Secondary | ICD-10-CM | POA: Diagnosis not present

## 2021-09-01 DIAGNOSIS — H2513 Age-related nuclear cataract, bilateral: Secondary | ICD-10-CM | POA: Diagnosis not present

## 2021-09-01 DIAGNOSIS — D3132 Benign neoplasm of left choroid: Secondary | ICD-10-CM | POA: Diagnosis not present

## 2021-09-02 DIAGNOSIS — Z48812 Encounter for surgical aftercare following surgery on the circulatory system: Secondary | ICD-10-CM | POA: Diagnosis not present

## 2021-09-02 DIAGNOSIS — I214 Non-ST elevation (NSTEMI) myocardial infarction: Secondary | ICD-10-CM | POA: Diagnosis not present

## 2021-09-02 DIAGNOSIS — I129 Hypertensive chronic kidney disease with stage 1 through stage 4 chronic kidney disease, or unspecified chronic kidney disease: Secondary | ICD-10-CM | POA: Diagnosis not present

## 2021-09-02 DIAGNOSIS — I2510 Atherosclerotic heart disease of native coronary artery without angina pectoris: Secondary | ICD-10-CM | POA: Diagnosis not present

## 2021-09-02 DIAGNOSIS — Z950 Presence of cardiac pacemaker: Secondary | ICD-10-CM | POA: Diagnosis not present

## 2021-09-02 DIAGNOSIS — I890 Lymphedema, not elsewhere classified: Secondary | ICD-10-CM | POA: Diagnosis not present

## 2021-09-02 DIAGNOSIS — Z85820 Personal history of malignant melanoma of skin: Secondary | ICD-10-CM | POA: Diagnosis not present

## 2021-09-02 DIAGNOSIS — Z9181 History of falling: Secondary | ICD-10-CM | POA: Diagnosis not present

## 2021-09-02 DIAGNOSIS — E785 Hyperlipidemia, unspecified: Secondary | ICD-10-CM | POA: Diagnosis not present

## 2021-09-02 DIAGNOSIS — I442 Atrioventricular block, complete: Secondary | ICD-10-CM | POA: Diagnosis not present

## 2021-09-02 DIAGNOSIS — Z951 Presence of aortocoronary bypass graft: Secondary | ICD-10-CM | POA: Diagnosis not present

## 2021-09-02 DIAGNOSIS — K219 Gastro-esophageal reflux disease without esophagitis: Secondary | ICD-10-CM | POA: Diagnosis not present

## 2021-09-02 DIAGNOSIS — Z7982 Long term (current) use of aspirin: Secondary | ICD-10-CM | POA: Diagnosis not present

## 2021-09-02 DIAGNOSIS — G4733 Obstructive sleep apnea (adult) (pediatric): Secondary | ICD-10-CM | POA: Diagnosis not present

## 2021-09-02 DIAGNOSIS — Z7901 Long term (current) use of anticoagulants: Secondary | ICD-10-CM | POA: Diagnosis not present

## 2021-09-02 DIAGNOSIS — N1831 Chronic kidney disease, stage 3a: Secondary | ICD-10-CM | POA: Diagnosis not present

## 2021-09-02 DIAGNOSIS — D62 Acute posthemorrhagic anemia: Secondary | ICD-10-CM | POA: Diagnosis not present

## 2021-09-02 DIAGNOSIS — Z8589 Personal history of malignant neoplasm of other organs and systems: Secondary | ICD-10-CM | POA: Diagnosis not present

## 2021-09-02 DIAGNOSIS — Z9981 Dependence on supplemental oxygen: Secondary | ICD-10-CM | POA: Diagnosis not present

## 2021-09-05 ENCOUNTER — Encounter: Payer: Self-pay | Admitting: Oncology

## 2021-09-06 ENCOUNTER — Other Ambulatory Visit: Payer: Self-pay | Admitting: Cardiothoracic Surgery

## 2021-09-06 DIAGNOSIS — Z9981 Dependence on supplemental oxygen: Secondary | ICD-10-CM | POA: Diagnosis not present

## 2021-09-06 DIAGNOSIS — Z7982 Long term (current) use of aspirin: Secondary | ICD-10-CM | POA: Diagnosis not present

## 2021-09-06 DIAGNOSIS — I129 Hypertensive chronic kidney disease with stage 1 through stage 4 chronic kidney disease, or unspecified chronic kidney disease: Secondary | ICD-10-CM | POA: Diagnosis not present

## 2021-09-06 DIAGNOSIS — D62 Acute posthemorrhagic anemia: Secondary | ICD-10-CM | POA: Diagnosis not present

## 2021-09-06 DIAGNOSIS — Z950 Presence of cardiac pacemaker: Secondary | ICD-10-CM | POA: Diagnosis not present

## 2021-09-06 DIAGNOSIS — Z48812 Encounter for surgical aftercare following surgery on the circulatory system: Secondary | ICD-10-CM | POA: Diagnosis not present

## 2021-09-06 DIAGNOSIS — I2510 Atherosclerotic heart disease of native coronary artery without angina pectoris: Secondary | ICD-10-CM | POA: Diagnosis not present

## 2021-09-06 DIAGNOSIS — Z8589 Personal history of malignant neoplasm of other organs and systems: Secondary | ICD-10-CM | POA: Diagnosis not present

## 2021-09-06 DIAGNOSIS — Z951 Presence of aortocoronary bypass graft: Secondary | ICD-10-CM

## 2021-09-06 DIAGNOSIS — E785 Hyperlipidemia, unspecified: Secondary | ICD-10-CM | POA: Diagnosis not present

## 2021-09-06 DIAGNOSIS — I214 Non-ST elevation (NSTEMI) myocardial infarction: Secondary | ICD-10-CM | POA: Diagnosis not present

## 2021-09-06 DIAGNOSIS — C438 Malignant melanoma of overlapping sites of skin: Secondary | ICD-10-CM

## 2021-09-06 DIAGNOSIS — Z9181 History of falling: Secondary | ICD-10-CM | POA: Diagnosis not present

## 2021-09-06 DIAGNOSIS — G4733 Obstructive sleep apnea (adult) (pediatric): Secondary | ICD-10-CM | POA: Diagnosis not present

## 2021-09-06 DIAGNOSIS — K219 Gastro-esophageal reflux disease without esophagitis: Secondary | ICD-10-CM | POA: Diagnosis not present

## 2021-09-06 DIAGNOSIS — Z7901 Long term (current) use of anticoagulants: Secondary | ICD-10-CM | POA: Diagnosis not present

## 2021-09-06 DIAGNOSIS — N1831 Chronic kidney disease, stage 3a: Secondary | ICD-10-CM | POA: Diagnosis not present

## 2021-09-06 DIAGNOSIS — Z85820 Personal history of malignant melanoma of skin: Secondary | ICD-10-CM | POA: Diagnosis not present

## 2021-09-06 DIAGNOSIS — I442 Atrioventricular block, complete: Secondary | ICD-10-CM | POA: Diagnosis not present

## 2021-09-06 DIAGNOSIS — I890 Lymphedema, not elsewhere classified: Secondary | ICD-10-CM | POA: Diagnosis not present

## 2021-09-10 ENCOUNTER — Other Ambulatory Visit: Payer: Self-pay | Admitting: Nurse Practitioner

## 2021-09-12 ENCOUNTER — Ambulatory Visit: Payer: TRICARE For Life (TFL)

## 2021-09-12 ENCOUNTER — Ambulatory Visit: Payer: Self-pay | Admitting: Cardiothoracic Surgery

## 2021-09-13 DIAGNOSIS — Z7901 Long term (current) use of anticoagulants: Secondary | ICD-10-CM | POA: Diagnosis not present

## 2021-09-13 DIAGNOSIS — D62 Acute posthemorrhagic anemia: Secondary | ICD-10-CM | POA: Diagnosis not present

## 2021-09-13 DIAGNOSIS — I129 Hypertensive chronic kidney disease with stage 1 through stage 4 chronic kidney disease, or unspecified chronic kidney disease: Secondary | ICD-10-CM | POA: Diagnosis not present

## 2021-09-13 DIAGNOSIS — Z8589 Personal history of malignant neoplasm of other organs and systems: Secondary | ICD-10-CM | POA: Diagnosis not present

## 2021-09-13 DIAGNOSIS — Z950 Presence of cardiac pacemaker: Secondary | ICD-10-CM | POA: Diagnosis not present

## 2021-09-13 DIAGNOSIS — I2510 Atherosclerotic heart disease of native coronary artery without angina pectoris: Secondary | ICD-10-CM | POA: Diagnosis not present

## 2021-09-13 DIAGNOSIS — E785 Hyperlipidemia, unspecified: Secondary | ICD-10-CM | POA: Diagnosis not present

## 2021-09-13 DIAGNOSIS — Z9981 Dependence on supplemental oxygen: Secondary | ICD-10-CM | POA: Diagnosis not present

## 2021-09-13 DIAGNOSIS — G4733 Obstructive sleep apnea (adult) (pediatric): Secondary | ICD-10-CM | POA: Diagnosis not present

## 2021-09-13 DIAGNOSIS — Z7982 Long term (current) use of aspirin: Secondary | ICD-10-CM | POA: Diagnosis not present

## 2021-09-13 DIAGNOSIS — I442 Atrioventricular block, complete: Secondary | ICD-10-CM | POA: Diagnosis not present

## 2021-09-13 DIAGNOSIS — Z9181 History of falling: Secondary | ICD-10-CM | POA: Diagnosis not present

## 2021-09-13 DIAGNOSIS — I890 Lymphedema, not elsewhere classified: Secondary | ICD-10-CM | POA: Diagnosis not present

## 2021-09-13 DIAGNOSIS — N1831 Chronic kidney disease, stage 3a: Secondary | ICD-10-CM | POA: Diagnosis not present

## 2021-09-13 DIAGNOSIS — K219 Gastro-esophageal reflux disease without esophagitis: Secondary | ICD-10-CM | POA: Diagnosis not present

## 2021-09-13 DIAGNOSIS — Z951 Presence of aortocoronary bypass graft: Secondary | ICD-10-CM | POA: Diagnosis not present

## 2021-09-13 DIAGNOSIS — Z48812 Encounter for surgical aftercare following surgery on the circulatory system: Secondary | ICD-10-CM | POA: Diagnosis not present

## 2021-09-13 DIAGNOSIS — I214 Non-ST elevation (NSTEMI) myocardial infarction: Secondary | ICD-10-CM | POA: Diagnosis not present

## 2021-09-13 DIAGNOSIS — Z85820 Personal history of malignant melanoma of skin: Secondary | ICD-10-CM | POA: Diagnosis not present

## 2021-09-13 NOTE — Progress Notes (Signed)
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 He underwent a CABG x 3 (left internal mammary artery to LAD, saphenous vein graft to circumflex marginal, saphenous vein graft to RCA)by Dr. Donata Pitts on 08/10/2021. He was Discharged in stable condition on 08/16/2021. Since discharge home, he has continued to progress.  He has been pleasantly surprised that he has had minimal pain.  He denies shortness of breath.  He has been going to the gym and riding an exercise bike and using the treadmill on his own.  He plans to start cardiac rehab next week.  He denies any palpitations or sensation of his heart rate racing.   Current Outpatient Medications  Medication Sig Dispense Refill   omeprazole (PRILOSEC) 20 MG capsule TAKE 1 CAPSULE BY MOUTH EVERY DAY 90 capsule 0   acetaminophen (TYLENOL) 500 MG tablet Take 1-2 tablets (500-1,000 mg total) by mouth every 6 (six) hours as needed. 30 tablet 0   allopurinol (ZYLOPRIM) 300 MG tablet Take 300 mg by mouth daily.     amiodarone (PACERONE) 200 MG tablet Take 2 tablets (400 mg total) by mouth 2 (two) times daily. X 5 days, then decrease to 200 mg BID 90 tablet 1   apixaban (ELIQUIS) 5 MG TABS tablet Take 1 tablet (5 mg total) by mouth 2 (two) times daily. 60 tablet 3   aspirin 81 MG EC tablet Take 81 mg by mouth daily.     atorvastatin (LIPITOR) 20 MG tablet Take 20 mg by mouth daily.     Carboxymeth-Glyc-Polysorb PF (REFRESH OPTIVE MEGA-3) 0.5-1-0.5 % SOLN Place 1 drop into both eyes daily as needed (dry eyes).     clonazePAM (KLONOPIN) 0.5 MG tablet 1 TABLET IN THE MORNING, 2 IN THE EVENING (Patient taking differently: Take 0.5-1 mg by mouth 2 (two) times daily. 1 tablet in the  morning, 2 in the evening) 90 tablet 0   hydrocortisone 2.5 % ointment Apply 1 application. topically daily as needed (itching).     lidocaine-prilocaine (EMLA) cream Apply 1 application. topically as needed. Apply small amount of cream to port site approx 1-2 hours prior to appointment. 30 g 11   lisinopril (ZESTRIL) 20 MG tablet Take 20 mg by mouth daily.     LORazepam (ATIVAN) 2 MG tablet Take 1 tablet (2 mg total) by mouth every 8 (eight) hours as needed for seizure. 20 tablet 0   metoprolol succinate (TOPROL-XL) 25 MG 24 hr tablet Take 25 mg by mouth daily.      Multiple Vitamin (MULTIVITAMIN WITH MINERALS) TABS tablet Take 1 tablet by mouth daily. Centrum Silver     ondansetron (ZOFRAN) 4 MG tablet TAKE 1 TABLET BY MOUTH EVERY 8 HOURS AS NEEDED FOR NAUSEA AND VOMITING (Patient taking differently: Take 4 mg by mouth every 8 (eight) hours as needed for vomiting or nausea.) 90 tablet 1   traMADol (ULTRAM) 50 MG tablet Take 1 tablet (50 mg total) by mouth every 6 (six) hours as needed for moderate pain. 28 tablet 0   VIMPAT 100 MG TABS TAKE 1 TABLET (100 MG TOTAL) BY MOUTH IN THE MORNING  AND AT BEDTIME. 60 tablet 3  Vital Signs: BP 126/83 Pulse 82 Respirations 20 SPO2 95% on room air   Physical Exam: CV-regular rate and rhythm, no murmur. Pulmonary-breath sounds are clear to auscultation Abdomen-soft and nontender Extremities-mild lower extremity edema left greater than right, this is chronic. Wounds-sternotomy incision is well-healed as are the right lower extremity EVH incisions.  Diagnostic Tests: CLINICAL DATA:  Status post coronary artery bypass grafts   EXAM: CHEST - 2 VIEW   COMPARISON:  08/14/2021   FINDINGS: Transverse diameter of heart is slightly increased. There is previous coronary artery bypass surgery. Pacemaker battery is seen in the left infraclavicular region with tips of leads in right atrium and right ventricle. Tip of right IJ chest port is seen in the  superior vena cava.   IMPRESSION: There are no new infiltrates or signs of pulmonary edema.     Electronically Signed   By: Edward Pitts M.D.   On: 09/14/2021 13:49    Impression and Plan: [name] is making a progressive recovery following CABG x3.  His heart is an irregular rhythm.  I reviewed his hospital chart and did not see any evidence of atrial fibrillation.  I asked him to reduce the amiodarone to 200 mg once daily and we will plan to discontinue in 1 month if he said no evidence of arrhythmias. Patient was instructed to continue with sternal precautions (I.e. no lifting more than 10 pounds) for the next 3 weeks.  As long as the patient is NOT taking any narcotic for pain, he may begin driving short distances (I.e. 30 minutes  or less during the day) and may gradually increase the frequency and duration as tolerates. We encourage the  patient to participate in cardiac rehab;he does/does not wish to do so.  He has followed up with PCP regarding further surveillance of HGA1C 5.7 (pre diabetes) He has seen Dr. Bethanie Pitts on 06/05.   Edward Roca, PA-C   [facility] [phone]

## 2021-09-14 ENCOUNTER — Other Ambulatory Visit: Payer: Self-pay

## 2021-09-14 ENCOUNTER — Ambulatory Visit (INDEPENDENT_AMBULATORY_CARE_PROVIDER_SITE_OTHER): Payer: Self-pay | Admitting: Physician Assistant

## 2021-09-14 ENCOUNTER — Ambulatory Visit
Admission: RE | Admit: 2021-09-14 | Discharge: 2021-09-14 | Disposition: A | Discharge status: Home / Self Care | Payer: Medicare Other | Source: Ambulatory Visit | Attending: Cardiothoracic Surgery | Admitting: Cardiothoracic Surgery

## 2021-09-14 ENCOUNTER — Encounter: Payer: Medicare Other | Attending: Internal Medicine

## 2021-09-14 VITALS — BP 126/83 | HR 82 | Resp 20 | Ht 68.0 in | Wt 238.0 lb

## 2021-09-14 DIAGNOSIS — Z951 Presence of aortocoronary bypass graft: Secondary | ICD-10-CM | POA: Insufficient documentation

## 2021-09-14 DIAGNOSIS — Z955 Presence of coronary angioplasty implant and graft: Secondary | ICD-10-CM | POA: Diagnosis not present

## 2021-09-14 DIAGNOSIS — Z48812 Encounter for surgical aftercare following surgery on the circulatory system: Secondary | ICD-10-CM | POA: Insufficient documentation

## 2021-09-14 DIAGNOSIS — Z950 Presence of cardiac pacemaker: Secondary | ICD-10-CM | POA: Diagnosis not present

## 2021-09-14 NOTE — Progress Notes (Signed)
Virtual Visit completed. Patient informed on EP and RD appointment and 6 Minute walk test. Patient also informed of patient health questionnaires on My Chart. Patient Verbalizes understanding. Visit diagnosis can be found in [facility] 08/08/2021.

## 2021-09-14 NOTE — Patient Instructions (Signed)
Continue to observe sternal precautions with no lifting greater than 10 pounds for another 3 weeks.  You may resume driving   You may participate in cardiac rehab  Follow-up in 1 month

## 2021-09-15 ENCOUNTER — Ambulatory Visit
Admission: RE | Admit: 2021-09-15 | Discharge: 2021-09-15 | Disposition: A | Discharge status: Home / Self Care | Payer: Medicare Other | Source: Ambulatory Visit | Attending: Oncology | Admitting: Oncology

## 2021-09-15 ENCOUNTER — Inpatient Hospital Stay: Payer: Medicare Other | Attending: Oncology

## 2021-09-15 ENCOUNTER — Inpatient Hospital Stay: Payer: 59

## 2021-09-15 DIAGNOSIS — N62 Hypertrophy of breast: Secondary | ICD-10-CM | POA: Diagnosis not present

## 2021-09-15 DIAGNOSIS — Z95828 Presence of other vascular implants and grafts: Secondary | ICD-10-CM

## 2021-09-15 DIAGNOSIS — K769 Liver disease, unspecified: Secondary | ICD-10-CM | POA: Diagnosis not present

## 2021-09-15 DIAGNOSIS — N1831 Chronic kidney disease, stage 3a: Secondary | ICD-10-CM | POA: Insufficient documentation

## 2021-09-15 DIAGNOSIS — C438 Malignant melanoma of overlapping sites of skin: Secondary | ICD-10-CM | POA: Diagnosis not present

## 2021-09-15 DIAGNOSIS — Z79899 Other long term (current) drug therapy: Secondary | ICD-10-CM | POA: Diagnosis not present

## 2021-09-15 DIAGNOSIS — K7689 Other specified diseases of liver: Secondary | ICD-10-CM | POA: Insufficient documentation

## 2021-09-15 DIAGNOSIS — K5730 Diverticulosis of large intestine without perforation or abscess without bleeding: Secondary | ICD-10-CM | POA: Diagnosis not present

## 2021-09-15 DIAGNOSIS — K439 Ventral hernia without obstruction or gangrene: Secondary | ICD-10-CM | POA: Diagnosis not present

## 2021-09-15 DIAGNOSIS — Z950 Presence of cardiac pacemaker: Secondary | ICD-10-CM | POA: Diagnosis not present

## 2021-09-15 DIAGNOSIS — I3139 Other pericardial effusion (noninflammatory): Secondary | ICD-10-CM | POA: Diagnosis not present

## 2021-09-15 DIAGNOSIS — D649 Anemia, unspecified: Secondary | ICD-10-CM

## 2021-09-15 DIAGNOSIS — I700 Atherosclerosis of aorta: Secondary | ICD-10-CM | POA: Diagnosis not present

## 2021-09-15 DIAGNOSIS — Z5112 Encounter for antineoplastic immunotherapy: Secondary | ICD-10-CM

## 2021-09-15 DIAGNOSIS — C439 Malignant melanoma of skin, unspecified: Secondary | ICD-10-CM | POA: Insufficient documentation

## 2021-09-15 DIAGNOSIS — D631 Anemia in chronic kidney disease: Secondary | ICD-10-CM | POA: Insufficient documentation

## 2021-09-15 DIAGNOSIS — L80 Vitiligo: Secondary | ICD-10-CM

## 2021-09-15 LAB — CBC WITH DIFFERENTIAL/PLATELET
Abs Immature Granulocytes: 0.05 10*3/uL (ref 0.00–0.07)
Basophils Absolute: 0 10*3/uL (ref 0.0–0.1)
Basophils Relative: 0 %
Eosinophils Absolute: 0.1 10*3/uL (ref 0.0–0.5)
Eosinophils Relative: 2 %
HCT: 30.8 % — ABNORMAL LOW (ref 39.0–52.0)
Hemoglobin: 9.9 g/dL — ABNORMAL LOW (ref 13.0–17.0)
Immature Granulocytes: 1 %
Lymphocytes Relative: 19 %
Lymphs Abs: 1 10*3/uL (ref 0.7–4.0)
MCH: 29.8 pg (ref 26.0–34.0)
MCHC: 32.1 g/dL (ref 30.0–36.0)
MCV: 92.8 fL (ref 80.0–100.0)
Monocytes Absolute: 0.4 10*3/uL (ref 0.1–1.0)
Monocytes Relative: 8 %
Neutro Abs: 3.6 10*3/uL (ref 1.7–7.7)
Neutrophils Relative %: 70 %
Platelets: 227 10*3/uL (ref 150–400)
RBC: 3.32 MIL/uL — ABNORMAL LOW (ref 4.22–5.81)
RDW: 14 % (ref 11.5–15.5)
WBC: 5.2 10*3/uL (ref 4.0–10.5)
nRBC: 0 % (ref 0.0–0.2)

## 2021-09-15 LAB — COMPREHENSIVE METABOLIC PANEL
ALT: 30 U/L (ref 0–44)
AST: 23 U/L (ref 15–41)
Albumin: 4 g/dL (ref 3.5–5.0)
Alkaline Phosphatase: 105 U/L (ref 38–126)
Anion gap: 9 (ref 5–15)
BUN: 27 mg/dL — ABNORMAL HIGH (ref 8–23)
CO2: 23 mmol/L (ref 22–32)
Calcium: 9.1 mg/dL (ref 8.9–10.3)
Chloride: 108 mmol/L (ref 98–111)
Creatinine, Ser: 1.68 mg/dL — ABNORMAL HIGH (ref 0.61–1.24)
GFR, Estimated: 46 mL/min — ABNORMAL LOW (ref >60)
Glucose, Bld: 137 mg/dL — ABNORMAL HIGH (ref 70–99)
Potassium: 4 mmol/L (ref 3.5–5.1)
Sodium: 140 mmol/L (ref 135–145)
Total Bilirubin: 0.5 mg/dL (ref 0.3–1.2)
Total Protein: 7.5 g/dL (ref 6.5–8.1)

## 2021-09-15 LAB — T4, FREE: Free T4: 0.88 ng/dL (ref 0.61–1.12)

## 2021-09-15 LAB — TSH: TSH: 3.813 u[IU]/mL (ref 0.350–4.500)

## 2021-09-15 MED ORDER — HEPARIN SOD (PORK) LOCK FLUSH 100 UNIT/ML IV SOLN
500.0000 [IU] | Freq: Once | INTRAVENOUS | Status: AC
  Administered 2021-09-15: 500 [IU] via INTRAVENOUS

## 2021-09-15 MED ORDER — IOHEXOL 300 MG/ML  SOLN
85.0000 mL | Freq: Once | INTRAMUSCULAR | Status: AC | PRN
  Administered 2021-09-15: 85 mL via INTRAVENOUS

## 2021-09-15 MED ORDER — SODIUM CHLORIDE 0.9% FLUSH
10.0000 mL | Freq: Once | INTRAVENOUS | Status: AC
  Administered 2021-09-15: 10 mL via INTRAVENOUS
  Filled 2021-09-15: qty 10

## 2021-09-15 MED ORDER — HEPARIN SOD (PORK) LOCK FLUSH 100 UNIT/ML IV SOLN
500.0000 [IU] | Freq: Once | INTRAVENOUS | Status: AC
  Administered 2021-09-15: 500 [IU] via INTRAVENOUS
  Filled 2021-09-15: qty 5

## 2021-09-19 ENCOUNTER — Inpatient Hospital Stay (HOSPITAL_BASED_OUTPATIENT_CLINIC_OR_DEPARTMENT_OTHER): Payer: Medicare Other | Admitting: Oncology

## 2021-09-19 ENCOUNTER — Encounter: Payer: Self-pay | Admitting: Oncology

## 2021-09-19 ENCOUNTER — Telehealth: Payer: Self-pay | Admitting: Oncology

## 2021-09-19 ENCOUNTER — Encounter: Payer: Medicare Other | Admitting: *Deleted

## 2021-09-19 ENCOUNTER — Other Ambulatory Visit: Payer: Self-pay | Admitting: Neurology

## 2021-09-19 VITALS — Ht 68.5 in | Wt 238.2 lb

## 2021-09-19 VITALS — BP 132/54 | HR 64 | Temp 97.1°F | Ht 68.0 in | Wt 237.0 lb

## 2021-09-19 DIAGNOSIS — Z48812 Encounter for surgical aftercare following surgery on the circulatory system: Secondary | ICD-10-CM | POA: Diagnosis not present

## 2021-09-19 DIAGNOSIS — K7689 Other specified diseases of liver: Secondary | ICD-10-CM | POA: Diagnosis not present

## 2021-09-19 DIAGNOSIS — C438 Malignant melanoma of overlapping sites of skin: Secondary | ICD-10-CM | POA: Diagnosis not present

## 2021-09-19 DIAGNOSIS — Z951 Presence of aortocoronary bypass graft: Secondary | ICD-10-CM

## 2021-09-19 DIAGNOSIS — C779 Secondary and unspecified malignant neoplasm of lymph node, unspecified: Secondary | ICD-10-CM

## 2021-09-19 DIAGNOSIS — Z950 Presence of cardiac pacemaker: Secondary | ICD-10-CM | POA: Diagnosis not present

## 2021-09-19 DIAGNOSIS — C439 Malignant melanoma of skin, unspecified: Secondary | ICD-10-CM

## 2021-09-19 DIAGNOSIS — N1832 Chronic kidney disease, stage 3b: Secondary | ICD-10-CM | POA: Diagnosis not present

## 2021-09-19 DIAGNOSIS — N1831 Chronic kidney disease, stage 3a: Secondary | ICD-10-CM | POA: Diagnosis not present

## 2021-09-19 DIAGNOSIS — D631 Anemia in chronic kidney disease: Secondary | ICD-10-CM | POA: Diagnosis not present

## 2021-09-19 DIAGNOSIS — Z79899 Other long term (current) drug therapy: Secondary | ICD-10-CM | POA: Diagnosis not present

## 2021-09-19 NOTE — Telephone Encounter (Signed)
Verify Drug Registry For Clonazepam 0.5 Mg Tablet Last Filled: 07/21/2021 Quantity: 90 tablets for 30 days Last appointment: 06/21/2021 Next appointment: N/A

## 2021-09-19 NOTE — Telephone Encounter (Signed)
Spoke with pt to inform him of MRI appt, pt confirmed.

## 2021-09-20 ENCOUNTER — Other Ambulatory Visit: Payer: Self-pay

## 2021-09-20 ENCOUNTER — Telehealth: Payer: Self-pay

## 2021-09-20 DIAGNOSIS — K769 Liver disease, unspecified: Secondary | ICD-10-CM

## 2021-09-20 NOTE — Telephone Encounter (Signed)
Patient scheduled for 6/30 10am at [facility] for US liver. Unable to reach pt, but detailed message left on ph with appt details. Will call back to make sure he got appt.

## 2021-09-21 DIAGNOSIS — Z951 Presence of aortocoronary bypass graft: Secondary | ICD-10-CM

## 2021-09-21 DIAGNOSIS — Z48812 Encounter for surgical aftercare following surgery on the circulatory system: Secondary | ICD-10-CM | POA: Diagnosis not present

## 2021-09-21 NOTE — Progress Notes (Signed)
Daily Session Note  Patient Details  Name: Edward Pitts MRN: [id number] Date of Birth: 08-Mar-1961 Referring Provider:   Flowsheet Row Cardiac Rehab from 09/19/2021 in [facility] Cardiac and Pulmonary Rehab  Referring Provider Serafina Royals MD       Encounter Date: 09/21/2021  Check In:  Session Check In - 09/21/21 0751       Check-In   Supervising physician immediately available to respond to emergencies See telemetry face sheet for immediately available ER MD    Location [facility]-Cardiac & Pulmonary Rehab    Staff Present Carson Myrtle, BS, RRT, CPFT;Joseph [place], Jaci Carrel, BS, ACSM CEP, Exercise Physiologist;Ardys [place] Rosalia Hammers, MPA, RN    Virtual Visit No    Medication changes reported     Yes    Comments no longer taking clonazepam    Fall or balance concerns reported    No    Warm-up and Cool-down Performed on first and last piece of equipment    Resistance Training Performed Yes    VAD Patient? No    PAD/SET Patient? No      Pain Assessment   Currently in Pain? No/denies                Social History   Tobacco Use  Smoking Status Never  Smokeless Tobacco Never    Goals Met:  Independence with exercise equipment Exercise tolerated well No report of concerns or symptoms today Strength training completed today  Goals Unmet:  Not Applicable  Comments: First full day of exercise!  Patient was oriented to gym and equipment including functions, settings, policies, and procedures.  Patient's individual exercise prescription and treatment plan were reviewed.  All starting workloads were established based on the results of the 6 minute walk test done at initial orientation visit.  The plan for exercise progression was also introduced and progression will be customized based on patient's performance and goals.    Dr. Emily Filbert is Medical Director for [place].  Dr. Ottie Glazier is Medical Director for [facility] Pulmonary  Rehabilitation.

## 2021-09-22 DIAGNOSIS — I2510 Atherosclerotic heart disease of native coronary artery without angina pectoris: Secondary | ICD-10-CM | POA: Diagnosis not present

## 2021-09-22 DIAGNOSIS — Z951 Presence of aortocoronary bypass graft: Secondary | ICD-10-CM | POA: Diagnosis not present

## 2021-09-22 DIAGNOSIS — Z48812 Encounter for surgical aftercare following surgery on the circulatory system: Secondary | ICD-10-CM | POA: Diagnosis not present

## 2021-09-22 DIAGNOSIS — I10 Essential (primary) hypertension: Secondary | ICD-10-CM | POA: Diagnosis not present

## 2021-09-22 DIAGNOSIS — E782 Mixed hyperlipidemia: Secondary | ICD-10-CM | POA: Diagnosis not present

## 2021-09-22 DIAGNOSIS — I442 Atrioventricular block, complete: Secondary | ICD-10-CM | POA: Diagnosis not present

## 2021-09-22 NOTE — Progress Notes (Signed)
Daily Session Note  Patient Details  Name: Edward Pitts MRN: [id number] Date of Birth: 30-Jan-1961 Referring Provider:   Flowsheet Row Cardiac Rehab from 09/19/2021 in [facility] Cardiac and Pulmonary Rehab  Referring Provider Serafina Royals MD       Encounter Date: 09/22/2021  Check In:  Session Check In - 09/22/21 0905       Check-In   Supervising physician immediately available to respond to emergencies See telemetry face sheet for immediately available ER MD    Location [facility]-Cardiac & Pulmonary Rehab    Staff Present Justin Mend, RCP,RRT,BSRT;Jaelah Hauth Rosalia Hammers, MPA, RN;Melissa Barlow, RDN, LDN;Jessica Luan Pulling, MA, RCEP, CCRP, CCET    Virtual Visit No    Medication changes reported     No    Fall or balance concerns reported    No    Warm-up and Cool-down Performed on first and last piece of equipment    Resistance Training Performed Yes    VAD Patient? No    PAD/SET Patient? No      Pain Assessment   Currently in Pain? No/denies                Social History   Tobacco Use  Smoking Status Never  Smokeless Tobacco Never    Goals Met:  Independence with exercise equipment Exercise tolerated well No report of concerns or symptoms today Strength training completed today  Goals Unmet:  Not Applicable  Comments: Pt able to follow exercise prescription today without complaint.  Will continue to monitor for progression.    Dr. Emily Filbert is Medical Director for [place].  Dr. Ottie Glazier is Medical Director for [facility] : [facility] Pulmonary Rehabilitation.

## 2021-09-22 NOTE — Telephone Encounter (Signed)
I left a voicemail for the patient (as per DPR) to inform him to follow up with his new neurologist Dr. Mickeal Skinner. I provided a call back number for any further questions or concerns.

## 2021-09-23 ENCOUNTER — Ambulatory Visit (HOSPITAL_COMMUNITY)
Admission: RE | Admit: 2021-09-23 | Discharge: 2021-09-23 | Disposition: A | Discharge status: Home / Self Care | Payer: Medicare Other | Source: Ambulatory Visit | Attending: Oncology | Admitting: Oncology

## 2021-09-23 ENCOUNTER — Other Ambulatory Visit: Payer: Self-pay | Admitting: Oncology

## 2021-09-23 DIAGNOSIS — Z85820 Personal history of malignant melanoma of skin: Secondary | ICD-10-CM | POA: Diagnosis not present

## 2021-09-23 DIAGNOSIS — R160 Hepatomegaly, not elsewhere classified: Secondary | ICD-10-CM | POA: Diagnosis not present

## 2021-09-23 DIAGNOSIS — K769 Liver disease, unspecified: Secondary | ICD-10-CM | POA: Diagnosis not present

## 2021-09-23 DIAGNOSIS — D492 Neoplasm of unspecified behavior of bone, soft tissue, and skin: Secondary | ICD-10-CM | POA: Diagnosis not present

## 2021-09-23 DIAGNOSIS — K7689 Other specified diseases of liver: Secondary | ICD-10-CM | POA: Diagnosis not present

## 2021-09-23 MED ORDER — SULFUR HEXAFLUORIDE MICROSPH 60.7-25 MG IJ SUSR: 2.4000 mL | Freq: Once | INTRAMUSCULAR | Status: AC | PRN

## 2021-09-23 MED ORDER — SULFUR HEXAFLUORIDE MICROSPH 60.7-25 MG IJ SUSR
INTRAMUSCULAR | Status: AC
  Administered 2021-09-23: 2.4 mL via INTRAVENOUS
  Filled 2021-09-23: qty 5

## 2021-09-23 MED ORDER — SULFUR HEXAFLUORIDE MICROSPH 60.7-25 MG IJ SUSR: 5.0000 mL | Freq: Once | INTRAMUSCULAR | Status: DC | PRN

## 2021-09-26 ENCOUNTER — Encounter: Payer: Medicare Other | Attending: Internal Medicine | Admitting: *Deleted

## 2021-09-26 ENCOUNTER — Inpatient Hospital Stay: Payer: Medicare Other | Attending: Oncology | Admitting: Oncology

## 2021-09-26 ENCOUNTER — Encounter: Payer: Self-pay | Admitting: Oncology

## 2021-09-26 VITALS — BP 142/73 | HR 88 | Temp 97.4°F | Wt 239.0 lb

## 2021-09-26 DIAGNOSIS — C439 Malignant melanoma of skin, unspecified: Secondary | ICD-10-CM | POA: Diagnosis not present

## 2021-09-26 DIAGNOSIS — F419 Anxiety disorder, unspecified: Secondary | ICD-10-CM | POA: Diagnosis not present

## 2021-09-26 DIAGNOSIS — C779 Secondary and unspecified malignant neoplasm of lymph node, unspecified: Secondary | ICD-10-CM | POA: Diagnosis not present

## 2021-09-26 DIAGNOSIS — Z951 Presence of aortocoronary bypass graft: Secondary | ICD-10-CM | POA: Insufficient documentation

## 2021-09-26 DIAGNOSIS — R4189 Other symptoms and signs involving cognitive functions and awareness: Secondary | ICD-10-CM

## 2021-09-26 DIAGNOSIS — I4891 Unspecified atrial fibrillation: Secondary | ICD-10-CM | POA: Diagnosis not present

## 2021-09-26 DIAGNOSIS — C438 Malignant melanoma of overlapping sites of skin: Secondary | ICD-10-CM | POA: Insufficient documentation

## 2021-09-26 DIAGNOSIS — R569 Unspecified convulsions: Secondary | ICD-10-CM | POA: Diagnosis not present

## 2021-09-26 DIAGNOSIS — Z7901 Long term (current) use of anticoagulants: Secondary | ICD-10-CM | POA: Insufficient documentation

## 2021-09-26 MED ORDER — LORAZEPAM 1 MG PO TABS: 1.0000 mg | ORAL_TABLET | Freq: Three times a day (TID) | ORAL | 0 refills | Status: DC | PRN

## 2021-09-26 NOTE — Progress Notes (Signed)
Hematology/Oncology Progress note [phone] [phone]      Patient Care Team: [name], MD as PCP - General (Cardiology) [name], MD as Consulting Physician (Oncology) Mickeal Skinner Acey Lav, MD as Consulting Physician (Oncology) [name], MD as Consulting Physician (Cardiology)  ASSESSMENT & PLAN:   Malignant melanoma of overlapping sites [facility]) 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  New liver lesion 2.2 cm, CT scan results and enhanced ultrasound results were reviewed and discussed with patient. This is an unfavorable position for biopsy.  Recommend PET scan restaging to see if there is any other more accessible biopsy options.  Discussed about attempt of percutaneous needle liver biopsy/or excisional liver biopsy if no other biopsy options. Patient is on Eliquis for A-fib.  Need cardiology clearance of being off on Eliquis 2 to 3 days prior to the liver biopsy.  Anxiety Recommend Ativan 1 mg every 8 hours as needed for anxiety. Advised patient not to drive after taking Ativan.  Episodes of altered cognition These episodes were considered to be likely secondary to seizure activities. Recommend patient continue follow-up with neurology. I recommend patient to avoid driving. Take Ativan 2 mg for seizure episodes.   Orders Placed This Encounter  Procedures   NM PET Image Restag (PS) Skull Base To Thigh    Standing Status:   Future    Standing Expiration Date:   09/26/2022    Order Specific Question:   If indicated for the ordered procedure, I authorize the administration of a radiopharmaceutical per Radiology protocol    Answer:   Yes    Order Specific Question:   Preferred imaging location?    Answer:   [facility]  Follow-up To be determined All questions were answered.  The patient knows to call the clinic with any problems, questions or concerns.  [name], MD, PhD [facility] Hematology Oncology 09/26/2021   CHIEF COMPLAINTS/REASON FOR VISIT:  Follow up for melanoma HISTORY OF PRESENTING ILLNESS:   Edward Pitts is a  61 y.o.  male presents for recurrent malignant melanoma.   Oncology History  Malignant melanoma of overlapping sites [facility])  04/16/2019 Cancer Staging   Staging form: Melanoma of the Skin, AJCC 8th Edition - Pathologic: Stage Unknown (rpTX, pN1b, cM0) - Signed by [name], MD on 07/27/2020 Stage prefix: Recurrence    04/23/2019 Initial Diagnosis   Malignant melanoma   -He has a history of left lower extremity melanoma in 2011, status post local excision -04/16/2019 patient underwent left groin mass resection  Resection pathology showed malignant melanoma, replacing a lymph node, with extracapsular extension, peripheral and deep margins involved.  Left inguinal contents, all 7 lymph nodes were negative for melanoma in the lymph nodes. Extranodal melanoma identified in lymphatic and interstitium between nodes -PDL1 80% TPS    07/07/2019 -  Radiation Therapy   status post adjuvant radiation.   07/23/2019 - 07/21/2021 Chemotherapy   Nivolumab q14d      06/29/2020 Imaging   CT chest abdomen pelvis showed stable postoperative appearance of the left groin.  No evidence of local recurrence.  No evidence of metastatic disease in the chest abdomen or pelvis.  Hepatic steatosis.  Stable subcentimeter fluid attenuation lesion of the lateral right lobe of the liver, likely benign cyst or hemangioma.  Coronary artery disease.  Aortic atherosclerosis   03/10/2021 Imaging  MRI brain without contrast showed no definitive evidence of intracranial metastatic disease.  Study is limited by absence of intravenous contrast.     04/26/2021 Imaging   CT chest abdomen pelvis without contrast showed stable post operative changes of left groin with no evidence of  recurrent disease.  No evidence of metastatic disease in the chest abdomen pelvis.  Aortic atherosclerosis   09/15/2021 Imaging   CT chest abdomen pelvis w contrast  IMPRESSION: 1. Subtle hypodense 9 mm lesion in the left lobe of the liver is new from prior imaging including previous contrasted CT dating back to December 10, 2019, with the lesion appearing to equilibrate with background liver on delayed imaging sequence but is incompletely evaluated on this imaging study and technically nonspecific possibly reflecting a benign perfusional variant and while its appearance is not typical for that of a melanoma metastasis, it is not excluded on this examination. Suggest more definitive characterization by hepatic protocol MRI with and without contrast. 2. Stable postoperative changes in the left groin without evidence of local recurrent disease.3. No evidence of metastatic disease in the chest or pelvis.4.  Aortic Atherosclerosis (ICD10-I70.0).      10/20/2021 Imaging   CT chest abdomen pelvis showed stable postoperative/radiation appearance of the left groin.  No evidence of local recurrence/metastatic disease within the chest abdomen/pelvis.  Fatty liver disease.  Diverticulosis without evidence of typhlitis.  Aortic atherosclerosis    04/18/2021, patient establish care with neurology Dr. Mickeal Skinner for intermittent altered cognition.  he was recommended to start Vimpat.    INTERVAL HISTORY Edward Pitts is a 61 y.o. male who has above history reviewed by me today presents for follow up visit for management of inguinal nodal recurrence of melanoma Patient is concerned about prior images and requested an in person discussion of his results and neck steps. Patient continues to have spells.  :Review of Systems  Constitutional:  Negative for appetite change, chills, fatigue, fever and unexpected weight change.  HENT:   Negative for hearing loss and voice change.   Eyes:  Negative for eye problems and  icterus.  Respiratory:  Negative for chest tightness, cough and shortness of breath.   Cardiovascular:  Positive for leg swelling. Negative for chest pain.  Gastrointestinal:  Negative for abdominal distention and abdominal pain.  Endocrine: Negative for hot flashes.  Genitourinary:  Negative for difficulty urinating, dysuria and frequency.   Musculoskeletal:        Status post left hip replacement  Skin:  Negative for itching and rash.       Skin hypo-pigmentation on upper extremities, no change  Neurological:  Negative for light-headedness and numbness.       Spells  Hematological:  Negative for adenopathy. Does not bruise/bleed easily.  Psychiatric/Behavioral:  Negative for confusion.     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   Family history of adverse reaction to anesthesia    PONV mother   GERD (gastroesophageal reflux disease)    History of complete heart block    PPM placed   Hyperlipidemia    Hypertension  LBBB (left bundle branch block)    Lymphedema of left leg    uses thigh high compression stockings   Melanoma ([place]) 2012   skin cancer, left thigh   OSA on CPAP    PONV (postoperative nausea and vomiting) 04/16/2019   Port-A-Cath in place    RIGHT chest wall   Presence of cardiac pacemaker    [employer]   Seizures ([place])    still has episodes of dizziness. last event 1 month ago (march 2022) and will pass out. takes clonazepam    SURGICAL HISTORY: Past Surgical History:  Procedure Laterality Date   CORONARY ARTERY BYPASS GRAFT N/A 08/10/2021   Procedure: CORONARY ARTERY BYPASS GRAFTING (CABG) X 3 USING LEFT INTERNAL MAMMARY ARTERY AND RIGHT  GREATER SAPHENOUS VEIN;  Surgeon: Dahlia Byes, MD;  Location: [place];  Service: Open Heart Surgery;  Laterality: N/A;   CT RADIATION THERAPY GUIDE     left groin   dental implant     permanent implant   ENDOVEIN HARVEST OF GREATER SAPHENOUS VEIN Right 08/10/2021   Procedure: ENDOVEIN HARVEST OF GREATER SAPHENOUS VEIN;  Surgeon: Dahlia Byes, MD;  Location: [place];  Service: Open Heart Surgery;  Laterality: Right;   KNEE SURGERY Left    arthroscopy   LEFT HEART CATH AND CORONARY ANGIOGRAPHY Left 06/29/2017   Procedure: LEFT HEART CATH AND CORONARY ANGIOGRAPHY;  Surgeon: [name], MD;  Location: [facility];  Service: Cardiovascular;  Laterality: Left;   LEFT HEART CATH AND CORONARY ANGIOGRAPHY N/A 08/08/2021   Procedure: LEFT HEART CATH AND CORONARY ANGIOGRAPHY;  Surgeon: [name], MD;  Location: [place] [facility];  Service: Cardiovascular;  Laterality: N/A;   LYMPH NODE DISSECTION Left 04/16/2019   Procedure: Left inguinal Lymph Node Dissection;  Surgeon: [name], MD;  Location: [place];  Service: General;  Laterality: Left;   MELANOMA EXCISION Left 04/16/2019   Procedure: MELANOMA EXCISION LEFT GROIN MASS;  Surgeon: [name], MD;  Location: [place];  Service: General;  Laterality: Left;   MELANOMA EXCISION WITH SENTINEL LYMPH NODE BIOPSY Left 2012   Left calf    PACEMAKER INSERTION N/A 08/26/2018   Procedure: INSERTION PACEMAKER;  Surgeon: [name], MD;  Location: [facility];  Service: Cardiovascular;  Laterality: N/A;   PORTA CATH INSERTION N/A 08/26/2019   Procedure: PORTA CATH INSERTION;  Surgeon: Katha Cabal, MD;  Location: [place] [facility];  Service: Cardiovascular;  Laterality: N/A;   SUPERFICIAL LYMPH NODE BIOPSY / EXCISION Left 2020   lymph nodes removed around left groin melanoma site   TEE WITHOUT CARDIOVERSION N/A 08/10/2021   Procedure: TRANSESOPHAGEAL ECHOCARDIOGRAM (TEE);  Surgeon: Dahlia Byes, MD;  Location: [place];   Service: Open Heart Surgery;  Laterality: N/A;   TEMPORARY PACEMAKER N/A 08/25/2018   Procedure: TEMPORARY PACEMAKER;  Surgeon: [name], MD;  Location: [place] [facility];  Service: Cardiovascular;  Laterality: N/A;   TOTAL HIP ARTHROPLASTY Left 07/14/2020   Procedure: TOTAL HIP ARTHROPLASTY;  Surgeon: Dereck Leep, MD;  Location: [facility];  Service: Orthopedics;  Laterality: Left;    SOCIAL HISTORY: Social History   Socioeconomic History   Marital status: Married    Spouse name: Vicente Males    Number of children: 7   Years of education: 12   Highest education level: Not on file  Occupational History    Comment: disability  Tobacco Use   Smoking status: Never   Smokeless tobacco: Never  Vaping Use   Vaping  Use: Never used  Substance and Sexual Activity   Alcohol use: No   Drug use: No   Sexual activity: Not Currently  Other Topics Concern   Not on file  Social History Narrative   Lives with  Wife,   Has 2 small dogs   Caffeine use: sodas (2 per day)      Out of work on disability.  Has a walk in shower. No stairs to climb   Oncology treatment ongoing. Uses port a cath for treatment.      pacemaker   Social Determinants of Health   Financial Resource Strain: Low Risk  (02/12/2019)   Overall Financial Resource Strain (CARDIA)    Difficulty of Paying Living Expenses: Not hard at all  Food Insecurity: No Food Insecurity (02/12/2019)   Hunger Vital Sign    Worried About Running Out of Food in the Last Year: Never true    Ran Out of Food in the Last Year: Never true  Transportation Needs: Unmet Transportation Needs (02/12/2019)   PRAPARE - Hydrologist (Medical): Yes    Lack of Transportation (Non-Medical): Yes  Physical Activity: Unknown (02/12/2019)   Exercise Vital Sign    Days of Exercise per Week: 0 days    Minutes of Exercise per Session: Not on file  Stress: No Stress Concern Present (02/12/2019)   [place]    Feeling of Stress : Only a little  Social Connections: Unknown (02/12/2019)   Social Connection and Isolation Panel [NHANES]    Frequency of Communication with Friends and Family: More than three times a week    Frequency of Social Gatherings with Friends and Family: Not on file    Attends Religious Services: Not on file    Active Member of Clubs or Organizations: Not on file    Attends Archivist Meetings: Not on file    Marital Status: Married  Intimate Partner Violence: Not At Risk (02/12/2019)   Humiliation, Afraid, Rape, and Kick questionnaire    Fear of Current or Ex-Partner: No    Emotionally Abused: No    Physically Abused: No    Sexually Abused: No    FAMILY HISTORY: Family History  Problem Relation Age of Onset   Cancer Paternal Grandmother     ALLERGIES:  is allergic to ibuprofen, levetiracetam, and nsaids.  MEDICATIONS:  Current Outpatient Medications  Medication Sig Dispense Refill   acetaminophen (TYLENOL) 500 MG tablet Take 1-2 tablets (500-1,000 mg total) by mouth every 6 (six) hours as needed. 30 tablet 0   allopurinol (ZYLOPRIM) 300 MG tablet Take 300 mg by mouth daily.     apixaban (ELIQUIS) 5 MG TABS tablet Take 1 tablet (5 mg total) by mouth 2 (two) times daily. 60 tablet 3   aspirin 81 MG EC tablet Take 81 mg by mouth daily.     atorvastatin (LIPITOR) 40 MG tablet      hydrochlorothiazide (HYDRODIURIL) 25 MG tablet      hydrocortisone 2.5 % ointment Apply 1 application. topically daily as needed (itching).     lidocaine-prilocaine (EMLA) cream Apply 1 application. topically as needed. Apply small amount of cream to port site approx 1-2 hours prior to appointment. 30 g 11   lisinopril (ZESTRIL) 10 MG tablet      lisinopril (ZESTRIL) 20 MG tablet Take 20 mg by mouth daily.     LORazepam (ATIVAN) 1 MG tablet Take 1 tablet (1 mg  total) by mouth every 8 (eight) hours as needed for anxiety. 45  tablet 0   metoprolol succinate (TOPROL-XL) 25 MG 24 hr tablet Take 25 mg by mouth daily.     metoprolol succinate (TOPROL-XL) 25 MG 24 hr tablet Take 1 tablet by mouth daily.     Multiple Vitamin (MULTIVITAMIN WITH MINERALS) TABS tablet Take 1 tablet by mouth daily. Centrum Silver     omeprazole (PRILOSEC) 20 MG capsule TAKE 1 CAPSULE BY MOUTH EVERY DAY 90 capsule 0   ondansetron (ZOFRAN) 4 MG tablet TAKE 1 TABLET BY MOUTH EVERY 8 HOURS AS NEEDED FOR NAUSEA AND VOMITING 90 tablet 1   amiodarone (PACERONE) 200 MG tablet Take 2 tablets (400 mg total) by mouth 2 (two) times daily. X 5 days, then decrease to 200 mg BID (Patient not taking: Reported on 09/19/2021) 90 tablet 1   atorvastatin (LIPITOR) 20 MG tablet Take 20 mg by mouth daily. (Patient not taking: Reported on 09/19/2021)     Carboxymeth-Glyc-Polysorb PF (REFRESH OPTIVE MEGA-3) 0.5-1-0.5 % SOLN Place 1 drop into both eyes daily as needed (dry eyes). (Patient not taking: Reported on 09/19/2021)     clonazePAM (KLONOPIN) 0.5 MG tablet 1 TABLET IN THE MORNING, 2 IN THE EVENING (Patient not taking: Reported on 09/19/2021) 90 tablet 0   HYDROcodone-acetaminophen (NORCO/VICODIN) 5-325 MG tablet  (Patient not taking: Reported on 09/19/2021)     LORazepam (ATIVAN) 2 MG tablet Take 1 tablet (2 mg total) by mouth every 8 (eight) hours as needed for seizure. (Patient not taking: Reported on 09/19/2021) 20 tablet 0   meloxicam (MOBIC) 15 MG tablet  (Patient not taking: Reported on 09/19/2021)     meloxicam (MOBIC) 7.5 MG tablet Take 1 tablet by mouth 2 (two) times daily. (Patient not taking: Reported on 09/19/2021)     VIMPAT 100 MG TABS TAKE 1 TABLET (100 MG TOTAL) BY MOUTH IN THE MORNING AND AT BEDTIME. (Patient not taking: Reported on 09/26/2021) 60 tablet 3   No current facility-administered medications for this visit.     PHYSICAL EXAMINATION: ECOG PERFORMANCE STATUS: 1 - Symptomatic but completely ambulatory Vitals:   09/26/21 0914  BP: (!) 142/73   Pulse: 88  Temp: (!) 97.4 F (36.3 C)   Filed Weights   09/26/21 0914  Weight: 239 lb (108.4 kg)    Physical Exam Constitutional:      General: He is not in acute distress.    Comments: Patient ambulates independently  HENT:     Head: Normocephalic and atraumatic.  Eyes:     General: No scleral icterus.    Pupils: Pupils are equal, round, and reactive to light.  Cardiovascular:     Rate and Rhythm: Normal rate and regular rhythm.     Heart sounds: Normal heart sounds.  Pulmonary:     Effort: Pulmonary effort is normal. No respiratory distress.     Breath sounds: No wheezing.  Abdominal:     General: Bowel sounds are normal. There is no distension.     Palpations: Abdomen is soft. There is no mass.     Tenderness: There is no abdominal tenderness.     Comments:    Musculoskeletal:        General: No deformity. Normal range of motion.     Cervical back: Normal range of motion and neck supple.     Comments: Left lower extremity edema-chronic   Skin:    General: Skin is warm and dry.  Neurological:  Mental Status: He is alert and oriented to person, place, and time. Mental status is at baseline.     Cranial Nerves: No cranial nerve deficit.     Coordination: Coordination normal.  Psychiatric:        Mood and Affect: Mood normal.     LABORATORY DATA:  I have reviewed the data as listed    Latest Ref Rng & Units 09/15/2021    8:27 AM 08/15/2021    6:05 AM 08/14/2021    3:51 AM  CBC  WBC 4.0 - 10.5 K/uL 5.2  9.0  9.8   Hemoglobin 13.0 - 17.0 g/dL 9.9  10.5  8.3   Hematocrit 39.0 - 52.0 % 30.8  31.2  22.9   Platelets 150 - 400 K/uL 227  215  180       Latest Ref Rng & Units 09/15/2021    8:27 AM 08/16/2021    6:47 AM 08/15/2021    6:05 AM  CMP  Glucose 70 - 99 mg/dL 137  121  114   BUN 8 - 23 mg/dL 27  33  35   Creatinine 0.61 - 1.24 mg/dL 1.68  1.55  1.75   Sodium 135 - 145 mmol/L 140  138  142   Potassium 3.5 - 5.1 mmol/L 4.0  4.3  4.3   Chloride 98 -  111 mmol/L 108  105  108   CO2 22 - 32 mmol/L _0 Calcium 8.9 - 10.3 mg/dL 9.1  8.9  9.1   Total Protein 6.5 - 8.1 g/dL 7.5     Total Bilirubin 0.3 - 1.2 mg/dL 0.5     Alkaline Phos 38 - 126 U/L 105     AST 15 - 41 U/L 23     ALT 0 - 44 U/L 30        RADIOGRAPHIC STUDIES: I have personally reviewed the radiological images as listed and agreed with the findings in the report. US LIVER W/CM 1ST LESION  Result Date: 09/23/2021 CLINICAL DATA:  61 year old male with history of melanoma with development of new left lobe lesion measuring up to 9 mm on recent staging CT. The patient presents for contrast enhanced ultrasound for further characterization. EXAM: US LIVER WITH CONTRAST CONTRAST:  2.4 mL sulfur hexafluoride Number of injections: 1 COMPARISON:  09/15/2021 FINDINGS: LIVER Size: Normal. Echogenicity: Within normal limits. Surface Contour: Smooth. Observation 1 Lobe: Left Size: 2.2 cm Grayscale features: Hypoechoic with isoechoic center. Arterial phase enhancement features: Mildly hypoenhancing Onset of washout: 60 seconds Degree of washout: Marked Tumor in vein: No CEUS LI-RADS category: Not applicable in the absence of cirrhosis. IMPRESSION: Mildly hypoenhancing 2.2 cm mass in the posterior aspect of the left lobe of the liver with washout characteristics concerning for non hepatocellular malignancy, concerning for melanotic metastasis given history. The lesion is in an unfavorable location for percutaneous biopsy. Consider PET-CT for further characterization. Ruthann Cancer, MD [facility] Electronically Signed   By: Ruthann Cancer M.D.   On: 09/23/2021 15:39   CT CHEST ABDOMEN PELVIS W CONTRAST  Result Date: 09/15/2021 CLINICAL DATA:  History of melanoma, follow-up. * Tracking Code: BO * EXAM: CT CHEST, ABDOMEN, AND PELVIS WITH CONTRAST TECHNIQUE: Multidetector CT imaging of the chest, abdomen and pelvis was performed following  the standard protocol during bolus administration of intravenous contrast. RADIATION DOSE REDUCTION: This exam was performed according to the departmental dose-optimization program which includes automated exposure control, adjustment  of the mA and/or kV according to patient size and/or use of iterative reconstruction technique. CONTRAST:  40mL OMNIPAQUE IOHEXOL 300 MG/ML  SOLN COMPARISON:  Multiple priors including most recent CT April 26, 2021 FINDINGS: CT CHEST FINDINGS Cardiovascular: Accessed right chest Port-A-Cath with tip near the superior cavoatrial junction. Left chest cardiac conduction device with leads in the right atrium and right ventricle. Interval median sternotomy and CABG. Aortic atherosclerosis without aneurysmal dilation. No central pulmonary embolus on this nondedicated study. Normal size heart. Trace pericardial effusion there are postoperative. Mediastinum/Nodes: Mild stranding in the anterior mediastinum most likely reflect sequela of interval CABG. Hypodense 15 mm nodule in the right lobe of the thyroid on image 6/2. No pathologically enlarged mediastinal, hilar or axillary lymph nodes. Esophagus is grossly unremarkable. Lungs/Pleura: No suspicious pulmonary nodules or masses. No focal airspace consolidation. No pleural effusion. No pneumothorax. Musculoskeletal: No aggressive lytic or blastic lesion of bone. Median sternotomy wires are intact. Anterior chest wall incision. Bilateral gynecomastia. CT ABDOMEN PELVIS FINDINGS Hepatobiliary: Subtle 9 mm hypodense lesion in the left lobe of the liver on image 55/2 is new from prior imaging including contrasted CTs dating back to December 10, 2019, with the lesion appearing to equilibrate to background liver on delayed imaging. Similar tiny low-density lesion in the right lobe of the liver are too small to accurately characterize but stable over multiple priors and favored to reflect a cyst. No solid enhancing hepatic lesion. Gallbladder is  unremarkable. No biliary ductal dilation. Pancreas: No pancreatic ductal dilation or evidence of acute inflammation. Spleen: No splenomegaly or focal splenic lesion. Adrenals/Urinary Tract: Bilateral adrenal glands appear normal. No hydronephrosis. Hypodense 2 cm right renal cyst is considered benign requiring no independent imaging follow-up. Urinary bladder is unremarkable for degree of distension within limitation of streak artifact from left hip arthroplasty. Stomach/Bowel: Radiopaque enteric contrast material traverses the splenic flexure. Stomach is unremarkable for degree of distension. No pathologic dilation of small or large bowel. Left-sided colonic diverticulosis without findings of acute diverticulitis. No evidence of acute bowel inflammation. Vascular/Lymphatic: Aortic atherosclerosis without abdominal aortic aneurysm. No pathologically enlarged abdominal or pelvic lymph nodes. Reproductive: Prostate is unremarkable. Other: Similar postsurgical changes in the left groin without new enhancing soft tissue in the surgical bed. Moderate-sized fat containing ventral hernia. Musculoskeletal: Prior left total hip arthroplasty. No aggressive lytic or blastic lesion of bone. IMPRESSION: 1. Subtle hypodense 9 mm lesion in the left lobe of the liver is new from prior imaging including previous contrasted CT dating back to December 10, 2019, with the lesion appearing to equilibrate with background liver on delayed imaging sequence but is incompletely evaluated on this imaging study and technically nonspecific possibly reflecting a benign perfusional variant and while its appearance is not typical for that of a melanoma metastasis, it is not excluded on this examination. Suggest more definitive characterization by hepatic protocol MRI with and without contrast. 2. Stable postoperative changes in the left groin without evidence of local recurrent disease. 3. No evidence of metastatic disease in the chest or pelvis.  4.  Aortic Atherosclerosis (ICD10-I70.0). Electronically Signed   By: Dahlia Bailiff M.D.   On: 09/15/2021 16:33   DG Chest 2 View  Result Date: 09/14/2021 CLINICAL DATA:  Status post coronary artery bypass grafts EXAM: CHEST - 2 VIEW COMPARISON:  08/14/2021 FINDINGS: Transverse diameter of heart is slightly increased. There is previous coronary artery bypass surgery. Pacemaker battery is seen in the left infraclavicular region with tips of leads in right atrium  and right ventricle. Tip of right IJ chest port is seen in the superior vena cava. IMPRESSION: There are no new infiltrates or signs of pulmonary edema. Electronically Signed   By: Elmer Picker M.D.   On: 09/14/2021 13:49   DG Chest 2 View  Result Date: 08/14/2021 CLINICAL DATA:  Elevated myocardial infarction. EXAM: CHEST - 2 VIEW COMPARISON:  Aug 13, 2021 FINDINGS: Stable right Port-A-Cath in good position. Stable pacemaker. The cardiomediastinal silhouette is stable. Sternotomy wires are intact. No pneumothorax. No nodules or masses. No focal infiltrates. No overt edema. IMPRESSION: No active cardiopulmonary disease. Electronically Signed   By: Dorise Bullion III M.D.   On: 08/14/2021 08:51   DG Chest Port 1 View  Result Date: 08/13/2021 CLINICAL DATA:  Status post coronary artery bypass surgery EXAM: PORTABLE CHEST 1 VIEW COMPARISON:  Previous studies including the examination of 08/12/2021 FINDINGS: Left chest tube is seen. There is no demonstrable pneumothorax. There is interval decrease in linear densities in the medial left lower lung fields. There are no signs of pulmonary edema or new focal infiltrates. There is coronary bypass surgery. Tip of right IJ chest port is seen in the superior vena cava. Pacemaker battery is seen in the left infraclavicular region. There is interval removal of vascular sheath from right IJ. IMPRESSION: There is slight improvement in subsegmental atelectasis seen in the left lower lung fields. Other  findings as described in the body of the report. Electronically Signed   By: Elmer Picker M.D.   On: 08/13/2021 09:43   DG Chest Port 1 View  Result Date: 08/12/2021 CLINICAL DATA:  History of CABG. EXAM: PORTABLE CHEST 1 VIEW COMPARISON:  Aug 11, 2021. FINDINGS: RIGHT IJ vascular sheath remains in place terminating in the region of the brachiocephalic junction. RIGHT-sided Port-A-Cath with tip in the area of the caval to atrial junction. LEFT sided chest tube in place, tip near the LEFT lung apex. Lower mediastinal drain tip projecting over the lower chest/upper abdomen on the radiograph. LEFT-sided dual lead pacer device remains in place. Post median sternotomy for CABG. No change in cardiomediastinal contours. Lung volumes are slightly increased following extubation. No visible pneumothorax. No dense consolidation with subtle basilar opacities LEFT greater than RIGHT and with slight improvement since the prior study. On limited assessment no acute skeletal findings. IMPRESSION: 1. Status post extubation with improved lung volumes. 2. No pneumothorax. 3. Slight improvement in aeration of the lung bases with subtle basilar opacities LEFT greater than RIGHT, likely attributed to atelectasis. 4. Removal of Swan-Ganz catheter, vascular sheath remains in place via RIGHT IJ approach. Electronically Signed   By: Zetta Bills M.D.   On: 08/12/2021 07:56   DG Chest Port 1 View  Result Date: 08/11/2021 CLINICAL DATA:  Status post CABG yesterday. EXAM: PORTABLE CHEST 1 VIEW COMPARISON:  Chest x-ray from yesterday. FINDINGS: Unchanged endotracheal tube, right internal jugular Swan-Ganz catheter, right chest wall port catheter, mediastinal drain, and left chest tube. Unchanged left chest wall pacemaker. Stable cardiomediastinal silhouette status post CABG. Unchanged retrocardiac left lower lobe atelectasis. No focal consolidation, pleural effusion, or pneumothorax. IMPRESSION: 1. Stable support lines and  tubes. 2. Unchanged left lower lobe atelectasis. No pneumothorax. Electronically Signed   By: Titus Dubin M.D.   On: 08/11/2021 07:52   DG Abd 1 View  Result Date: 08/10/2021 CLINICAL DATA:  Encounter for OG tube placement. EXAM: ABDOMEN - 1 VIEW COMPARISON:  Recent similar study today at 9:51 p.m. FINDINGS: 10:48 p.m., 08/10/2021. OG  tube is in place interval advanced with tip now in the body of the stomach and appears adequately inserted. The bowel pattern is nonobstructive as far as visualized with the pelvic bowel not included in the exam. The lateral aspect of the right hemiabdomen was also cropped out. There is no supine evidence of free air. The visible visceral shadows are stable. No pathologic calcification is visible. IMPRESSION: OGT has been advanced with the tip now in the body of the stomach, grossly adequately inserted. Electronically Signed   By: Telford Nab M.D.   On: 08/10/2021 23:26   DG Abd 1 View  Result Date: 08/10/2021 CLINICAL DATA:  Check gastric catheter placement EXAM: ABDOMEN - 1 VIEW COMPARISON:  None Available. FINDINGS: Gastric catheter is noted with the tip in the stomach. Proximal side port lies in the distal esophagus. This should be advanced several cm deeper into the stomach. IMPRESSION: Gastric catheter as described. This should be advanced deeper into the stomach. Electronically Signed   By: Inez Catalina M.D.   On: 08/10/2021 21:58   DG Chest Port 1 View  Result Date: 08/10/2021 CLINICAL DATA:  Intubation EXAM: PORTABLE CHEST 1 VIEW COMPARISON:  08/10/2021 at 3:21 p.m. FINDINGS: Single frontal view of the chest demonstrates endotracheal tube overlying tracheal air column tip just below thoracic inlet. Right chest wall port and flow directed right internal jugular central venous catheter unchanged. Left chest tube and mediastinal drain are again noted and stable. Epicardial pacing wires and dual lead pacemaker are unchanged. Postsurgical changes from CABG.  Cardiac silhouette is unremarkable. Stable consolidation within the medial left lung base consistent with atelectasis. No effusion or pneumothorax. No acute bony abnormalities. IMPRESSION: 1. Support devices as above, with appropriate position of the endotracheal tube just below thoracic inlet. 2. Continued left lower lobe atelectasis. 3. No evidence of pneumothorax. Electronically Signed   By: Randa Ngo M.D.   On: 08/10/2021 17:14   DG Chest Port 1 View  Result Date: 08/10/2021 CLINICAL DATA:  Pneumothorax. EXAM: PORTABLE CHEST 1 VIEW COMPARISON:  Radiographs 08/10/2021 and 08/05/2021.  CT 04/26/2021. FINDINGS: 1521 hours. Interval median sternotomy and CABG. Tip of the endotracheal tube is just above the carina and could be pulled back 3 cm for more optimal positioning. Right IJ Swan-Ganz catheter extends into the main pulmonary artery. A right IJ Port-A-Cath appears unchanged at the level of the upper right atrium. Left subclavian pacemaker leads appear unchanged. A left-sided chest tube is in place. There are low lung volumes with mild left lung atelectasis. No evidence of pneumothorax or significant pleural effusion. The heart size is stable. IMPRESSION: 1. Interval CABG without evidence of pneumothorax. 2. Support system positioned as above. Tip of the endotracheal tube is just above the carina and could be pulled back 3 cm for more optimal positioning. Electronically Signed   By: Richardean Sale M.D.   On: 08/10/2021 15:38   DG Chest 2 View  Result Date: 08/10/2021 CLINICAL DATA:  Scheduled for CABG May 17. Preoperative chest radiograph. EXAM: CHEST - 2 VIEW COMPARISON:  Portable chest 08/05/2021 FINDINGS: The heart size and mediastinal contours are within normal limits. Both lungs are clear. The visualized skeletal structures are unremarkable. A right IJ port catheter again terminates at the level of the cavoatrial junction. There is a left chest dual lead pacing system with stable wire  insertions. IMPRESSION: No evidence of acute chest disease or interval changes. Electronically Signed   By: Telford Nab M.D.   On: 08/10/2021  00:33   VAS US DOPPLER PRE CABG  Result Date: 08/10/2021 PREOPERATIVE VASCULAR EVALUATION Patient Name:  LITTLETON HAUB  Date of Exam:   08/09/2021 Medical Rec #: [id number]     Accession #:    [id number] Date of Birth: 11-21-1960     Patient Gender: M Patient Age:   46 years Exam Location:  [facility] Procedure:      VAS US DOPPLER PRE CABG Referring Phys: Collier Salina VANTRIGT --------------------------------------------------------------------------------  Indications:      Pre-CABG. Risk Factors:     Hypertension, hyperlipidemia, no history of smoking, prior MI,                   coronary artery disease. Comparison Study: No prior studies Performing Technologist: Darlin Coco RDMS, RVT  Examination Guidelines: A complete evaluation includes B-mode imaging, spectral Doppler, color Doppler, and power Doppler as needed of all accessible portions of each vessel. Bilateral testing is considered an integral part of a complete examination. Limited examinations for reoccurring indications may be performed as noted.  Right Carotid Findings: +----------+--------+--------+--------+--------+------------------+           PSV cm/sEDV cm/sStenosisDescribeComments           +----------+--------+--------+--------+--------+------------------+ CCA Prox  130     20                                         +----------+--------+--------+--------+--------+------------------+ CCA Distal108     25                      intimal thickening +----------+--------+--------+--------+--------+------------------+ ICA Prox  102     22      1-39%           intimal thickening +----------+--------+--------+--------+--------+------------------+ ICA Distal104     31                                         +----------+--------+--------+--------+--------+------------------+  ECA       99      15                                         +----------+--------+--------+--------+--------+------------------+ +----------+--------+-------+---------+------------+           PSV cm/sEDV cmsDescribe Arm Pressure +----------+--------+-------+---------+------------+ Subclavian198            Turbulent             +----------+--------+-------+---------+------------+ +---------+--------+--+--------+--+---------+ VertebralPSV cm/s43EDV cm/s13Antegrade +---------+--------+--+--------+--+---------+ Left Carotid Findings: +----------+--------+--------+--------+------------+--------+           PSV cm/sEDV cm/sStenosisDescribe    Comments +----------+--------+--------+--------+------------+--------+ CCA Prox  161     34                                   +----------+--------+--------+--------+------------+--------+ CCA Distal150     31                                   +----------+--------+--------+--------+------------+--------+ ICA Prox  98      29      1-39%   heterogenous         +----------+--------+--------+--------+------------+--------+  ICA Distal106     41                                   +----------+--------+--------+--------+------------+--------+ ECA       167     21                                   +----------+--------+--------+--------+------------+--------+ +----------+--------+--------+---------+------------+ SubclavianPSV cm/sEDV cm/sDescribe Arm Pressure +----------+--------+--------+---------+------------+           201             Turbulent             +----------+--------+--------+---------+------------+ +---------+--------+--+--------+-+---------+ VertebralPSV cm/s49EDV cm/s9Antegrade +---------+--------+--+--------+-+---------+  ABI Findings: +--------+------------------+-----+---------+--------+ Right   Rt Pressure (mmHg)IndexWaveform Comment   +--------+------------------+-----+---------+--------+ Brachial                       triphasic         +--------+------------------+-----+---------+--------+ PTA                            triphasic         +--------+------------------+-----+---------+--------+ DP                             triphasic         +--------+------------------+-----+---------+--------+ +--------+------------------+-----+---------+-------+ Left    Lt Pressure (mmHg)IndexWaveform Comment +--------+------------------+-----+---------+-------+ Brachial                       triphasic        +--------+------------------+-----+---------+-------+ PTA                            triphasic        +--------+------------------+-----+---------+-------+ DP                             triphasic        +--------+------------------+-----+---------+-------+  Right Doppler Findings: +--------+--------+-----+---------+--------+ Site    PressureIndexDoppler  Comments +--------+--------+-----+---------+--------+ Brachial             triphasic         +--------+--------+-----+---------+--------+ Radial               triphasic         +--------+--------+-----+---------+--------+ Ulnar                triphasic         +--------+--------+-----+---------+--------+  Left Doppler Findings: +--------+--------+-----+---------+--------+ Site    PressureIndexDoppler  Comments +--------+--------+-----+---------+--------+ Brachial             triphasic         +--------+--------+-----+---------+--------+ Radial               triphasic         +--------+--------+-----+---------+--------+ Ulnar                triphasic         +--------+--------+-----+---------+--------+  Summary: Right Carotid: Velocities in the right ICA are consistent with a 1-39% stenosis. Left Carotid: Velocities in the left ICA are consistent with a 1-39% stenosis. Vertebrals:  Bilateral vertebral arteries  demonstrate antegrade flow. Subclavians: Bilateral subclavian artery flow was disturbed. Right Upper  Extremity: Doppler waveforms remain within normal limits with right radial compression. Doppler waveforms remain within normal limits with right ulnar compression. Left Upper Extremity: Doppler waveforms remain within normal limits with left radial compression. Doppler waveforms remain within normal limits with left ulnar compression.  Electronically signed by Harold Barban MD on 08/10/2021 at 12:10:21 AM.    Final    ECHOCARDIOGRAM COMPLETE  Result Date: 08/08/2021    ECHOCARDIOGRAM REPORT   Patient Name:   CARLON CHALOUX Date of Exam: 08/08/2021 Medical Rec #:  [id number]    Height:       67.0 in Accession #:    [id number]   Weight:       250.0 lb Date of Birth:  Nov 24, 1960    BSA:          2.223 m Patient Age:    79 years     BP:           138/66 mmHg Patient Gender: M            HR:           68 bpm. Exam Location:  [facility] Procedure: 2D Echo, Cardiac Doppler and Color Doppler Indications:     NSTEMI I21.4  History:         Patient has prior history of Echocardiogram examinations, most                  recent 08/26/2018. CAD, Pacemaker, Arrythmias:LBBB; Risk                  Factors:Hypertension.  Sonographer:     Sherrie Sport Referring Phys:  [id number] AGBATA Diagnosing Phys: Yolonda Kida MD  Sonographer Comments: Suboptimal apical window and no subcostal window. IMPRESSIONS  1. Left ventricular ejection fraction, by estimation, is 65 to 70%. The left ventricle has normal function. The left ventricle has no regional wall motion abnormalities. Left ventricular diastolic parameters are consistent with Grade I diastolic dysfunction (impaired relaxation).  2. Right ventricular systolic function is normal. The right ventricular size is normal.  3. The mitral valve is normal in structure. Trivial mitral valve regurgitation.  4. The aortic valve is normal in structure. Aortic valve regurgitation is not visualized.  FINDINGS  Left Ventricle: Left ventricular ejection fraction, by estimation, is 65 to 70%. The left ventricle has normal function. The left ventricle has no regional wall motion abnormalities. The left ventricular internal cavity size was normal in size. There is  no left ventricular hypertrophy. Left ventricular diastolic parameters are consistent with Grade I diastolic dysfunction (impaired relaxation). Right Ventricle: The right ventricular size is normal. No increase in right ventricular wall thickness. Right ventricular systolic function is normal. Left Atrium: Left atrial size was normal in size. Right Atrium: Right atrial size was normal in size. Pericardium: There is no evidence of pericardial effusion. Mitral Valve: The mitral valve is normal in structure. Trivial mitral valve regurgitation. MV peak gradient, 3.9 mmHg. The mean mitral valve gradient is 2.0 mmHg. Tricuspid Valve: The tricuspid valve is normal in structure. Tricuspid valve regurgitation is trivial. Aortic Valve: The aortic valve is normal in structure. Aortic valve regurgitation is not visualized. Aortic valve mean gradient measures 5.0 mmHg. Aortic valve peak gradient measures 8.4 mmHg. Aortic valve area, by VTI measures 2.02 cm. Pulmonic Valve: The pulmonic valve was normal in structure. Pulmonic valve regurgitation is not visualized. Aorta: The ascending aorta was not well visualized. IAS/Shunts: No atrial level shunt detected by color flow Doppler.  LEFT VENTRICLE PLAX 2D LVIDd:         5.40 cm   Diastology LVIDs:         3.40 cm   LV e' medial:    7.94 cm/s LV PW:         1.30 cm   LV E/e' medial:  9.8 LV IVS:        0.80 cm   LV e' lateral:   5.87 cm/s LVOT diam:     2.00 cm   LV E/e' lateral: 13.3 LV SV:         64 LV SV Index:   29 LVOT Area:     3.14 cm  RIGHT VENTRICLE RV Basal diam:  3.90 cm RV S prime:     15.30 cm/s TAPSE (M-mode): 2.6 cm LEFT ATRIUM             Index        RIGHT ATRIUM           Index LA diam:        3.70 cm  1.66 cm/m   RA Area:     14.60 cm LA Vol (A2C):   46.8 ml 21.05 ml/m  RA Volume:   37.60 ml  16.91 ml/m LA Vol (A4C):   55.1 ml 24.78 ml/m LA Biplane Vol: 53.6 ml 24.11 ml/m  AORTIC VALVE                     PULMONIC VALVE AV Area (Vmax):    2.19 cm      PV Vmax:        1.16 m/s AV Area (Vmean):   1.96 cm      PV Vmean:       71.200 cm/s AV Area (VTI):     2.02 cm      PV VTI:         0.259 m AV Vmax:           145.00 cm/s   PV Peak grad:   5.4 mmHg AV Vmean:          103.450 cm/s  PV Mean grad:   2.0 mmHg AV VTI:            0.317 m       RVOT Peak grad: 9 mmHg AV Peak Grad:      8.4 mmHg AV Mean Grad:      5.0 mmHg LVOT Vmax:         101.00 cm/s LVOT Vmean:        64.700 cm/s LVOT VTI:          0.204 m LVOT/AV VTI ratio: 0.64  AORTA Ao Root diam: 3.10 cm MITRAL VALVE               TRICUSPID VALVE MV Area (PHT): 3.31 cm    TR Peak grad:   17.5 mmHg MV Area VTI:   1.95 cm    TR Vmax:        209.00 cm/s MV Peak grad:  3.9 mmHg MV Mean grad:  2.0 mmHg    SHUNTS MV Vmax:       0.99 m/s    Systemic VTI:  0.20 m MV Vmean:      61.8 cm/s   Systemic Diam: 2.00 cm MV Decel Time: 229 msec    Pulmonic VTI:  0.337 m MV E velocity: 78.00 cm/s MV A velocity: 91.70 cm/s MV E/A ratio:  0.85 Dwayne D  Callwood MD Electronically signed by Yolonda Kida MD Signature Date/Time: 08/08/2021/5:32:25 PM    Final    CARDIAC CATHETERIZATION  Result Date: 08/08/2021   Mid RCA lesion is 45% stenosed.   Mid LM lesion is 80% stenosed.   The left ventricular systolic function is normal.   LV end diastolic pressure is normal.   The left ventricular ejection fraction is greater than 65% by visual estimate. 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 Normal LV systolic function with mild septal hypokinesis due to pacemaker with ejection fraction of 50%  Significant progression of distal left main coronary atherosclerosis to 75 to 80% and proximal right coronary artery to 40% Plan Proceed to coronary artery bypass grafting due to significant progression of left main coronary artery disease, non-ST elevation myocardial infarction, and abnormal stress test 3/23 High intensity cholesterol therapy Cardiac rehabilitation Further treatment options after above   DG Chest Port 1 View  Result Date: 08/05/2021 CLINICAL DATA:  Chest pain and dizziness after mowing the yard. EXAM: PORTABLE CHEST 1 VIEW COMPARISON:  05/27/2021 FINDINGS: The right IJ power port is stable. The pacer wires are stable. The cardiac silhouette, mediastinal and hilar contours are within normal limits and unchanged. The lungs are clear. No pleural effusions. No pulmonary lesions. The bony thorax is intact. IMPRESSION: No acute cardiopulmonary findings. Electronically Signed   By: Marijo Sanes M.D.   On: 08/05/2021 12:13

## 2021-09-26 NOTE — Assessment & Plan Note (Addendum)
These episodes were considered to be likely secondary to seizure activities. Recommend patient continue follow-up with neurology. I recommend patient to avoid driving. Take Ativan 2 mg for seizure episodes.

## 2021-09-26 NOTE — Assessment & Plan Note (Signed)
Recommend Ativan 1 mg every 8 hours as needed for anxiety. Advised patient not to drive after taking Ativan.

## 2021-09-26 NOTE — Assessment & Plan Note (Signed)
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  New liver lesion 2.2 cm, CT scan results and enhanced ultrasound results were reviewed and discussed with patient. This is an unfavorable position for biopsy.  Recommend PET scan restaging to see if there is any other more accessible biopsy options.  Discussed about attempt of percutaneous needle liver biopsy/or excisional liver biopsy if no other biopsy options. Patient is on Eliquis for A-fib.  Need cardiology clearance of being off on Eliquis 2 to 3 days prior to the liver biopsy.

## 2021-09-26 NOTE — Progress Notes (Signed)
Daily Session Note  Patient Details  Name: Edward Pitts MRN: [id number] Date of Birth: 04/09/1960 Referring Provider:   Flowsheet Row Cardiac Rehab from 09/19/2021 in [facility]  Referring Provider Kowalski, Bruce MD       Encounter Date: 09/26/2021  Check In:  Session Check In - 09/26/21 0843       Check-In   Supervising physician immediately available to respond to emergencies See telemetry face sheet for immediately available ER MD    Location [facility]    Staff Present Susanne Bice, RN, BSN, CCRP;Laureen Brown, BS, RRT, CPFT;Joseph Hood, RCP,RRT,BSRT    Virtual Visit No    Medication changes reported     No    Fall or balance concerns reported    No    Warm-up and Cool-down Performed on first and last piece of equipment    Resistance Training Performed Yes    VAD Patient? No    PAD/SET Patient? No      Pain Assessment   Currently in Pain? No/denies                Social History   Tobacco Use  Smoking Status Never  Smokeless Tobacco Never    Goals Met:  Independence with exercise equipment Exercise tolerated well No report of concerns or symptoms today  Goals Unmet:  Not Applicable  Comments: Pt able to follow exercise prescription today without complaint.  Will continue to monitor for progression.    Dr. Mark Miller is Medical Director for [facility].  Dr. Fuad Aleskerov is Medical Director for [facility]. 

## 2021-09-29 ENCOUNTER — Other Ambulatory Visit: Payer: Self-pay

## 2021-09-29 DIAGNOSIS — C439 Malignant melanoma of skin, unspecified: Secondary | ICD-10-CM

## 2021-10-03 ENCOUNTER — Encounter: Payer: Medicare Other | Admitting: *Deleted

## 2021-10-03 DIAGNOSIS — Z951 Presence of aortocoronary bypass graft: Secondary | ICD-10-CM

## 2021-10-03 NOTE — Progress Notes (Signed)
Daily Session Note  Patient Details  Name: Edward Pitts MRN: [id number] Date of Birth: October 11, 1960 Referring Provider:   Flowsheet Row Cardiac Rehab from 09/19/2021 in [facility]  Referring Provider Serafina Royals MD       Encounter Date: 10/03/2021  Check In:  Session Check In - 10/03/21 0839       Check-In   Supervising physician immediately available to respond to emergencies See telemetry face sheet for immediately available ER MD    Location [facility]    Staff Present Heath Lark, RN, BSN, Laveda Norman, BS, ACSM CEP, Exercise Physiologist;Joseph [place], [place]    Virtual Visit No    Medication changes reported     No    Fall or balance concerns reported    No    Warm-up and Cool-down Performed on first and last piece of equipment    Resistance Training Performed Yes    VAD Patient? No    PAD/SET Patient? No      Pain Assessment   Currently in Pain? No/denies                Social History   Tobacco Use  Smoking Status Never  Smokeless Tobacco Never    Goals Met:  Independence with exercise equipment Exercise tolerated well No report of concerns or symptoms today  Goals Unmet:  Not Applicable  Comments: Pt able to follow exercise prescription today without complaint.  Will continue to monitor for progression.    Dr. Emily Filbert is Medical Director for [facility].  Dr. Ottie Glazier is Medical Director for [facility].

## 2021-10-05 ENCOUNTER — Encounter: Payer: Self-pay | Admitting: *Deleted

## 2021-10-05 DIAGNOSIS — Z951 Presence of aortocoronary bypass graft: Secondary | ICD-10-CM | POA: Diagnosis not present

## 2021-10-05 NOTE — Progress Notes (Signed)
Cardiac Individual Treatment Plan  Patient Details  Name: Edward Pitts MRN: [id number] Date of Birth: 1961/01/10 Referring Provider:   Flowsheet Row Cardiac Rehab from 09/19/2021 in [facility] Cardiac and Pulmonary Rehab  Referring Provider Serafina Royals MD       Initial Encounter Date:  Flowsheet Row Cardiac Rehab from 09/19/2021 in [facility] Cardiac and Pulmonary Rehab  Date 09/19/21       Visit Diagnosis: S/P CABG x 3  Patient's Home Medications on Admission:  Current Outpatient Medications:    acetaminophen (TYLENOL) 500 MG tablet, Take 1-2 tablets (500-1,000 mg total) by mouth every 6 (six) hours as needed., Disp: 30 tablet, Rfl: 0   allopurinol (ZYLOPRIM) 300 MG tablet, Take 300 mg by mouth daily., Disp: , Rfl:    amiodarone (PACERONE) 200 MG tablet, Take 2 tablets (400 mg total) by mouth 2 (two) times daily. X 5 days, then decrease to 200 mg BID (Patient not taking: Reported on 09/19/2021), Disp: 90 tablet, Rfl: 1   apixaban (ELIQUIS) 5 MG TABS tablet, Take 1 tablet (5 mg total) by mouth 2 (two) times daily., Disp: 60 tablet, Rfl: 3   aspirin 81 MG EC tablet, Take 81 mg by mouth daily., Disp: , Rfl:    atorvastatin (LIPITOR) 20 MG tablet, Take 20 mg by mouth daily. (Patient not taking: Reported on 09/19/2021), Disp: , Rfl:    atorvastatin (LIPITOR) 40 MG tablet, , Disp: , Rfl:    Carboxymeth-Glyc-Polysorb PF (REFRESH OPTIVE MEGA-3) 0.5-1-0.5 % SOLN, Place 1 drop into both eyes daily as needed (dry eyes). (Patient not taking: Reported on 09/19/2021), Disp: , Rfl:    clonazePAM (KLONOPIN) 0.5 MG tablet, 1 TABLET IN THE MORNING, 2 IN THE EVENING (Patient not taking: Reported on 09/19/2021), Disp: 90 tablet, Rfl: 0   hydrochlorothiazide (HYDRODIURIL) 25 MG tablet, , Disp: , Rfl:    HYDROcodone-acetaminophen (NORCO/VICODIN) 5-325 MG tablet, , Disp: , Rfl:    hydrocortisone 2.5 % ointment, Apply 1 application. topically daily as needed (itching)., Disp: , Rfl:    lidocaine-prilocaine (EMLA)  cream, Apply 1 application. topically as needed. Apply small amount of cream to port site approx 1-2 hours prior to appointment., Disp: 30 g, Rfl: 11   lisinopril (ZESTRIL) 10 MG tablet, , Disp: , Rfl:    lisinopril (ZESTRIL) 20 MG tablet, Take 20 mg by mouth daily., Disp: , Rfl:    LORazepam (ATIVAN) 1 MG tablet, Take 1 tablet (1 mg total) by mouth every 8 (eight) hours as needed for anxiety., Disp: 45 tablet, Rfl: 0   LORazepam (ATIVAN) 2 MG tablet, Take 1 tablet (2 mg total) by mouth every 8 (eight) hours as needed for seizure. (Patient not taking: Reported on 09/19/2021), Disp: 20 tablet, Rfl: 0   meloxicam (MOBIC) 15 MG tablet, , Disp: , Rfl:    meloxicam (MOBIC) 7.5 MG tablet, Take 1 tablet by mouth 2 (two) times daily. (Patient not taking: Reported on 09/19/2021), Disp: , Rfl:    metoprolol succinate (TOPROL-XL) 25 MG 24 hr tablet, Take 25 mg by mouth daily., Disp: , Rfl:    metoprolol succinate (TOPROL-XL) 25 MG 24 hr tablet, Take 1 tablet by mouth daily., Disp: , Rfl:    Multiple Vitamin (MULTIVITAMIN WITH MINERALS) TABS tablet, Take 1 tablet by mouth daily. Centrum Silver, Disp: , Rfl:    omeprazole (PRILOSEC) 20 MG capsule, TAKE 1 CAPSULE BY MOUTH EVERY DAY, Disp: 90 capsule, Rfl: 0   ondansetron (ZOFRAN) 4 MG tablet, TAKE 1 TABLET BY MOUTH EVERY 8  HOURS AS NEEDED FOR NAUSEA AND VOMITING, Disp: 90 tablet, Rfl: 1   VIMPAT 100 MG TABS, TAKE 1 TABLET (100 MG TOTAL) BY MOUTH IN THE MORNING AND AT BEDTIME. 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   Family history of adverse reaction to anesthesia    PONV  mother   GERD (gastroesophageal reflux disease)    History of complete heart block    PPM placed   Hyperlipidemia    Hypertension    LBBB (left bundle branch block)    Lymphedema of left leg    uses thigh high compression stockings   Melanoma ([place]) 2012   skin cancer, left thigh   OSA on CPAP    PONV (postoperative nausea and vomiting) 04/16/2019   Port-A-Cath in place    RIGHT chest wall   Presence of cardiac pacemaker    [employer]   Seizures ([place])    still has episodes of dizziness. last event 1 month ago (march 2022) and will pass out. takes clonazepam    Tobacco Use: Social History   Tobacco Use  Smoking Status Never  Smokeless Tobacco Never    Labs: Review Flowsheet  More data exists      Latest Ref Rng & Units 08/06/2021 08/09/2021 08/10/2021 08/11/2021 08/12/2021  Labs for ITP Cardiac and Pulmonary Rehab  Cholestrol 0 - 200 mg/dL 156  - - - -  LDL (calc) 0 - 99 mg/dL 91  - - - -  HDL-C >40 mg/dL 34  - - - -  Trlycerides <150 mg/dL 153  - - - -  Hemoglobin A1c 4.8 - 5.6 % - 5.7  - - -  PH, Arterial 7.35 - 7.45 - 7.39  7.340  7.331  7.292  7.363  7.370  7.370  7.385  7.341  7.370  -  PCO2 arterial 32 - 48 mmHg - 40  40.0  43.8  49.7  41.0  41.4  44.9  41.9  43.3  27.6  -  Bicarbonate 20.0 - 28.0 mmol/L - 24.2  21.6  23.6  24.0  23.3  23.9  26.0  25.3  25.1  23.4  16.0  -  TCO2 22 - 32 mmol/L - - '23  25  26  23  25  25  25  25  27  28  27  26  23  23  25  17  '$ -  Acid-base deficit 0.0 - 2.0 mmol/L - 0.7  4.0  3.0  3.0  2.0  1.0  2.0  8.0  -  O2 Saturation % - 97.7  98  95  96  100  100  100  70  100  98  91  72.9  58.7      Exercise Target Goals: Exercise Program Goal: Individual exercise prescription set using results from initial 6 min walk test and THRR while considering  patient's activity barriers and safety.   Exercise Prescription Goal: Initial exercise prescription builds to 30-45 minutes a day of aerobic  activity, 2-3 days per week.  Home exercise  guidelines will be given to patient during program as part of exercise prescription that the participant will acknowledge.   Education: Aerobic Exercise: - Group verbal and visual presentation on the components of exercise prescription. Introduces F.I.T.T principle from ACSM for exercise prescriptions.  Reviews F.I.T.T. principles of aerobic exercise including progression. Written material given at graduation. Flowsheet Row Cardiac Rehab from 09/21/2021 in [facility] Cardiac and Pulmonary Rehab  Education need identified 09/19/21       Education: Resistance Exercise: - Group verbal and visual presentation on the components of exercise prescription. Introduces F.I.T.T principle from ACSM for exercise prescriptions  Reviews F.I.T.T. principles of resistance exercise including progression. Written material given at graduation.    Education: Exercise & Equipment Safety: - Individual verbal instruction and demonstration of equipment use and safety with use of the equipment. Flowsheet Row Cardiac Rehab from 09/21/2021 in [facility] Cardiac and Pulmonary Rehab  Date 09/14/21  Educator [facility]  Instruction Review Code 1- Verbalizes Understanding       Education: Exercise Physiology & General Exercise Guidelines: - Group verbal and written instruction with models to review the exercise physiology of the cardiovascular system and associated critical values. Provides general exercise guidelines with specific guidelines to those with heart or lung disease.  Flowsheet Row Cardiac Rehab from 09/21/2021 in [facility] Cardiac and Pulmonary Rehab  Date 09/21/21  Educator [facility]  Instruction Review Code 1- [employer] Understanding       Education: Flexibility, Balance, Mind/Body Relaxation: - Group verbal and visual presentation with interactive activity on the components of exercise prescription. Introduces F.I.T.T principle from ACSM for exercise prescriptions. Reviews F.I.T.T. principles of flexibility and balance exercise  training including progression. Also discusses the mind body connection.  Reviews various relaxation techniques to help reduce and manage stress (i.e. Deep breathing, progressive muscle relaxation, and visualization). Balance handout provided to take home. Written material given at graduation.   Activity Barriers & Risk Stratification:  Activity Barriers & Cardiac Risk Stratification - 09/19/21 1145       Activity Barriers & Cardiac Risk Stratification   Activity Barriers Deconditioning;Muscular Weakness;Shortness of Breath;Balance Concerns;Other (comment)    Comments compression sock on left leg for swelling    Cardiac Risk Stratification High             6 Minute Walk:  6 Minute Walk     Row Name 09/19/21 1143         6 Minute Walk   Phase Initial     Distance 1042 feet     Walk Time 6 minutes     # of Rest Breaks 0     MPH 1.97     METS 2.43     RPE 9     Perceived Dyspnea  1     VO2 Peak 8.51     Symptoms Yes (comment)     Comments SOB     Resting HR 61 bpm     Resting BP 128/74     Resting Oxygen Saturation  95 %     Exercise Oxygen Saturation  during 6 min walk 96 %     Max Ex. HR 106 bpm     Max Ex. BP 126/74     2 Minute Post BP 124/62              Oxygen Initial Assessment:   Oxygen Re-Evaluation:   Oxygen Discharge (Final Oxygen Re-Evaluation):   Initial Exercise Prescription:  Initial Exercise  Prescription - 09/19/21 1100       Date of Initial Exercise RX and Referring Provider   Date 09/19/21    Referring Provider Serafina Royals MD      Oxygen   Maintain Oxygen Saturation 88% or higher      Treadmill   MPH 1.9    Grade 0    Minutes 15    METs 2.45      Recumbant Bike   Level 1    RPM 50    Watts 15    Minutes 15    METs 2.5      NuStep   Level 1    SPM 80    Minutes 15    METs 2.5      Track   Laps 26    Minutes 15    METs 2.41      Prescription Details   Frequency (times per week) 3    Duration Progress to  30 minutes of continuous aerobic without signs/symptoms of physical distress      Intensity   THRR 40-80% of Max Heartrate 100-139    Ratings of Perceived Exertion 11-13    Perceived Dyspnea 0-4      Progression   Progression Continue to progress workloads to maintain intensity without signs/symptoms of physical distress.      Resistance Training   Training Prescription Yes    Weight 4 lb    Reps 10-15             Perform Capillary Blood Glucose checks as needed.  Exercise Prescription Changes:   Exercise Prescription Changes     Row Name 09/19/21 1100 10/03/21 0900           Response to Exercise   Blood Pressure (Admit) 128/74 128/76      Blood Pressure (Exercise) 126/74 160/94      Blood Pressure (Exit) 124/62 118/62      Heart Rate (Admit) 61 bpm 56 bpm      Heart Rate (Exercise) 106 bpm 100 bpm      Heart Rate (Exit) 76 bpm 72 bpm      Oxygen Saturation (Admit) 95 % --      Oxygen Saturation (Exercise) 96 % --      Rating of Perceived Exertion (Exercise) 9 13      Perceived Dyspnea (Exercise) 1 --      Symptoms SOB none      Comments walk test results --      Duration -- Continue with 30 min of aerobic exercise without signs/symptoms of physical distress.      Intensity -- THRR unchanged        Progression   Progression -- Continue to progress workloads to maintain intensity without signs/symptoms of physical distress.      Average METs -- 2.41        Resistance Training   Training Prescription -- Yes      Weight -- 4 lb      Reps -- 10-15        Interval Training   Interval Training -- No        NuStep   Level -- 6      Minutes -- 15      METs -- 2.5        REL-XR   Level -- 3      Minutes -- 15      METs -- 2.1        Track  Laps -- 30      Minutes -- 15      METs -- 2.63        Oxygen   Maintain Oxygen Saturation -- 88% or higher               Exercise Comments:   Exercise Comments     Row Name 09/21/21 0751            Exercise Comments First full day of exercise!  Patient was oriented to gym and equipment including functions, settings, policies, and procedures.  Patient's individual exercise prescription and treatment plan were reviewed.  All starting workloads were established based on the results of the 6 minute walk test done at initial orientation visit.  The plan for exercise progression was also introduced and progression will be customized based on patient's performance and goals.                Exercise Goals and Review:   Exercise Goals     Row Name 09/19/21 1158             Exercise Goals   Increase Physical Activity Yes       Intervention Provide advice, education, support and counseling about physical activity/exercise needs.;Develop an individualized exercise prescription for aerobic and resistive training based on initial evaluation findings, risk stratification, comorbidities and participant's personal goals.       Expected Outcomes Long Term: Exercising regularly at least 3-5 days a week.;Short Term: Attend rehab on a regular basis to increase amount of physical activity.;Long Term: Add in home exercise to make exercise part of routine and to increase amount of physical activity.       Increase Strength and Stamina Yes       Intervention Provide advice, education, support and counseling about physical activity/exercise needs.;Develop an individualized exercise prescription for aerobic and resistive training based on initial evaluation findings, risk stratification, comorbidities and participant's personal goals.       Expected Outcomes Short Term: Increase workloads from initial exercise prescription for resistance, speed, and METs.;Long Term: Improve cardiorespiratory fitness, muscular endurance and strength as measured by increased METs and functional capacity (6MWT);Short Term: Perform resistance training exercises routinely during rehab and add in resistance training at home        Able to understand and use rate of perceived exertion (RPE) scale Yes       Intervention Provide education and explanation on how to use RPE scale       Expected Outcomes Short Term: Able to use RPE daily in rehab to express subjective intensity level;Long Term:  Able to use RPE to guide intensity level when exercising independently       Able to understand and use Dyspnea scale Yes       Intervention Provide education and explanation on how to use Dyspnea scale       Expected Outcomes Short Term: Able to use Dyspnea scale daily in rehab to express subjective sense of shortness of breath during exertion;Long Term: Able to use Dyspnea scale to guide intensity level when exercising independently       Knowledge and understanding of Target Heart Rate Range (THRR) Yes       Intervention Provide education and explanation of THRR including how the numbers were predicted and where they are located for reference       Expected Outcomes Short Term: Able to state/look up THRR;Short Term: Able to use daily as guideline for intensity in rehab;Long Term: Able  to use THRR to govern intensity when exercising independently       Able to check pulse independently Yes       Intervention Provide education and demonstration on how to check pulse in carotid and radial arteries.;Review the importance of being able to check your own pulse for safety during independent exercise       Expected Outcomes Short Term: Able to explain why pulse checking is important during independent exercise;Long Term: Able to check pulse independently and accurately       Understanding of Exercise Prescription Yes       Intervention Provide education, explanation, and written materials on patient's individual exercise prescription       Expected Outcomes Short Term: Able to explain program exercise prescription;Long Term: Able to explain home exercise prescription to exercise independently                Exercise Goals Re-Evaluation :   Exercise Goals Re-Evaluation     Row Name 09/21/21 0752 10/03/21 0906           Exercise Goal Re-Evaluation   Exercise Goals Review Able to understand and use rate of perceived exertion (RPE) scale;Knowledge and understanding of Target Heart Rate Range (THRR);Understanding of Exercise Prescription;Increase Physical Activity;Increase Strength and Stamina;Able to understand and use Dyspnea scale;Able to check pulse independently Increase Physical Activity;Increase Strength and Stamina;Understanding of Exercise Prescription      Comments Reviewed RPE and dyspnea scales, THR and program prescription with pt today.  Pt voiced understanding and was given a copy of goals to take home. Ignacio is off to a good start in rehab.  He has completed his first four full days of exercise thus far.  He is up to 30 laps on the track and level 6 on the NuStep. We will conitnue to montior his progress.      Expected Outcomes Short: Use RPE daily to regulate intensity. Long: Follow program prescription in THR. Short: Conitnue to attend rehab reguarly Long: Continue to follow program prescription               Discharge Exercise Prescription (Final Exercise Prescription Changes):  Exercise Prescription Changes - 10/03/21 0900       Response to Exercise   Blood Pressure (Admit) 128/76    Blood Pressure (Exercise) 160/94    Blood Pressure (Exit) 118/62    Heart Rate (Admit) 56 bpm    Heart Rate (Exercise) 100 bpm    Heart Rate (Exit) 72 bpm    Rating of Perceived Exertion (Exercise) 13    Symptoms none    Duration Continue with 30 min of aerobic exercise without signs/symptoms of physical distress.    Intensity THRR unchanged      Progression   Progression Continue to progress workloads to maintain intensity without signs/symptoms of physical distress.    Average METs 2.41      Resistance Training   Training Prescription Yes    Weight 4 lb    Reps 10-15      Interval Training   Interval Training  No      NuStep   Level 6    Minutes 15    METs 2.5      REL-XR   Level 3    Minutes 15    METs 2.1      Track   Laps 30    Minutes 15    METs 2.63      Oxygen   Maintain Oxygen Saturation 88%  or higher             Nutrition:  Target Goals: Understanding of nutrition guidelines, daily intake of sodium '1500mg'$ , cholesterol '200mg'$ , calories 30% from fat and 7% or less from saturated fats, daily to have 5 or more servings of fruits and vegetables.  Education: All About Nutrition: -Group instruction provided by verbal, written material, interactive activities, discussions, models, and posters to present general guidelines for heart healthy nutrition including fat, fiber, MyPlate, the role of sodium in heart healthy nutrition, utilization of the nutrition label, and utilization of this knowledge for meal planning. Follow up email sent as well. Written material given at graduation. Flowsheet Row Cardiac Rehab from 09/21/2021 in [facility] Cardiac and Pulmonary Rehab  Education need identified 09/19/21       Biometrics:  Pre Biometrics - 09/19/21 1158       Pre Biometrics   Height 5' 8.5" (1.74 m)    Weight 238 lb 3.2 oz (108 kg)    BMI (Calculated) 35.69    Single Leg Stand 0.9 seconds              Nutrition Therapy Plan and Nutrition Goals:  Nutrition Therapy & Goals - 09/19/21 1159       Intervention Plan   Intervention Prescribe, educate and counsel regarding individualized specific dietary modifications aiming towards targeted core components such as weight, hypertension, lipid management, diabetes, heart failure and other comorbidities.    Expected Outcomes Short Term Goal: Understand basic principles of dietary content, such as calories, fat, sodium, cholesterol and nutrients.;Short Term Goal: A plan has been developed with personal nutrition goals set during dietitian appointment.;Long Term Goal: Adherence to prescribed nutrition plan.              Nutrition Assessments:  MEDIFICTS Score Key: ?70 Need to make dietary changes  40-70 Heart Healthy Diet ? 40 Therapeutic Level Cholesterol Diet  Flowsheet Row Cardiac Rehab from 09/19/2021 in [facility] Cardiac and Pulmonary Rehab  Picture Your Plate Total Score on Admission 64      Picture Your Plate Scores: <53 Unhealthy dietary pattern with much room for improvement. 41-50 Dietary pattern unlikely to meet recommendations for good health and room for improvement. 51-60 More healthful dietary pattern, with some room for improvement.  >60 Healthy dietary pattern, although there may be some specific behaviors that could be improved.    Nutrition Goals Re-Evaluation:   Nutrition Goals Discharge (Final Nutrition Goals Re-Evaluation):   Psychosocial: Target Goals: Acknowledge presence or absence of significant depression and/or stress, maximize coping skills, provide positive support system. Participant is able to verbalize types and ability to use techniques and skills needed for reducing stress and depression.   Education: Stress, Anxiety, and Depression - Group verbal and visual presentation to define topics covered.  Reviews how body is impacted by stress, anxiety, and depression.  Also discusses healthy ways to reduce stress and to treat/manage anxiety and depression.  Written material given at graduation.   Education: Sleep Hygiene -Provides group verbal and written instruction about how sleep can affect your health.  Define sleep hygiene, discuss sleep cycles and impact of sleep habits. Review good sleep hygiene tips.    Initial Review & Psychosocial Screening:  Initial Psych Review & Screening - 09/14/21 0941       Initial Review   Current issues with None Identified      Family Dynamics   Good Support System? Yes    Comments He can look to his borther and  sister for support. He feels ready to get his health back in order. Patients states depression or anxiety.       Barriers   Psychosocial barriers to participate in program The patient should benefit from training in stress management and relaxation.;There are no identifiable barriers or psychosocial needs.      Screening Interventions   Interventions Encouraged to exercise;To provide support and resources with identified psychosocial needs;Provide feedback about the scores to participant    Expected Outcomes Short Term goal: Utilizing psychosocial counselor, staff and physician to assist with identification of specific Stressors or current issues interfering with healing process. Setting desired goal for each stressor or current issue identified.;Long Term Goal: Stressors or current issues are controlled or eliminated.;Short Term goal: Identification and review with participant of any Quality of Life or Depression concerns found by scoring the questionnaire.;Long Term goal: The participant improves quality of Life and PHQ9 Scores as seen by post scores and/or verbalization of changes             Quality of Life Scores:   Quality of Life - 09/19/21 1159       Quality of Life   Select Quality of Life      Quality of Life Scores   Health/Function Pre 23.43 %    Socioeconomic Pre 27.33 %    Psych/Spiritual Pre 28.29 %    Family Pre 28.5 %    GLOBAL Pre 25.86 %            Scores of 19 and below usually indicate a poorer quality of life in these areas.  A difference of  2-3 points is a clinically meaningful difference.  A difference of 2-3 points in the total score of the Quality of Life Index has been associated with significant improvement in overall quality of life, self-image, physical symptoms, and general health in studies assessing change in quality of life.  PHQ-9: Review Flowsheet       09/19/2021  Depression screen PHQ 2/9  Decreased Interest 0  Down, Depressed, Hopeless 0  PHQ - 2 Score 0  Altered sleeping 2  Tired, decreased energy 2  Change in appetite 0  Feeling bad or  failure about yourself  0  Trouble concentrating 0  Moving slowly or fidgety/restless 0  Suicidal thoughts 0  PHQ-9 Score 4  Difficult doing work/chores Not difficult at all   Interpretation of Total Score  Total Score Depression Severity:  1-4 = Minimal depression, 5-9 = Mild depression, 10-14 = Moderate depression, 15-19 = Moderately severe depression, 20-27 = Severe depression   Psychosocial Evaluation and Intervention:  Psychosocial Evaluation - 09/14/21 0945       Psychosocial Evaluation & Interventions   Interventions Encouraged to exercise with the program and follow exercise prescription;Relaxation education;Stress management education    Comments He can look to his borther and sister for support. He feels ready to get his health back in order. Patients states depression or anxiety.    Expected Outcomes Short: Start [employer] to help with mood. Long: Maintain a healthy mental state    Continue Psychosocial Services  Follow up required by staff             Psychosocial Re-Evaluation:   Psychosocial Discharge (Final Psychosocial Re-Evaluation):   Vocational Rehabilitation: Provide vocational rehab assistance to qualifying candidates.   Vocational Rehab Evaluation & Intervention:   Education: Education Goals: Education classes will be provided on a variety of topics geared toward better understanding of heart health and  risk factor modification. Participant will state understanding/return demonstration of topics presented as noted by education test scores.  Learning Barriers/Preferences:  Learning Barriers/Preferences - 09/14/21 0940       Learning Barriers/Preferences   Learning Barriers None    Learning Preferences None             General Cardiac Education Topics:  AED/CPR: - Group verbal and written instruction with the use of models to demonstrate the basic use of the AED with the basic ABC's of resuscitation.   Anatomy and Cardiac  Procedures: - Group verbal and visual presentation and models provide information about basic cardiac anatomy and function. Reviews the testing methods done to diagnose heart disease and the outcomes of the test results. Describes the treatment choices: Medical Management, Angioplasty, or Coronary Bypass Surgery for treating various heart conditions including Myocardial Infarction, Angina, Valve Disease, and Cardiac Arrhythmias.  Written material given at graduation. Flowsheet Row Cardiac Rehab from 09/21/2021 in [facility] Cardiac and Pulmonary Rehab  Education need identified 09/19/21       Medication Safety: - Group verbal and visual instruction to review commonly prescribed medications for heart and lung disease. Reviews the medication, class of the drug, and side effects. Includes the steps to properly store meds and maintain the prescription regimen.  Written material given at graduation.   Intimacy: - Group verbal instruction through game format to discuss how heart and lung disease can affect sexual intimacy. Written material given at graduation..   Know Your Numbers and Heart Failure: - Group verbal and visual instruction to discuss disease risk factors for cardiac and pulmonary disease and treatment options.  Reviews associated critical values for Overweight/Obesity, Hypertension, Cholesterol, and Diabetes.  Discusses basics of heart failure: signs/symptoms and treatments.  Introduces Heart Failure Zone chart for action plan for heart failure.  Written material given at graduation. Flowsheet Row Cardiac Rehab from 09/21/2021 in [facility] Cardiac and Pulmonary Rehab  Education need identified 09/19/21       Infection Prevention: - Provides verbal and written material to individual with discussion of infection control including proper hand washing and proper equipment cleaning during exercise session. Flowsheet Row Cardiac Rehab from 09/21/2021 in [facility] Cardiac and Pulmonary Rehab  Date 09/14/21   Educator [facility]  Instruction Review Code 1- Verbalizes Understanding       Falls Prevention: - Provides verbal and written material to individual with discussion of falls prevention and safety. Flowsheet Row Cardiac Rehab from 09/21/2021 in [facility] Cardiac and Pulmonary Rehab  Date 09/14/21  Educator [facility]  Instruction Review Code 1- Verbalizes Understanding       Other: -Provides group and verbal instruction on various topics (see comments)   Knowledge Questionnaire Score:  Knowledge Questionnaire Score - 09/19/21 1200       Knowledge Questionnaire Score   Pre Score 21/26             Core Components/Risk Factors/Patient Goals at Admission:  Personal Goals and Risk Factors at Admission - 09/19/21 1200       Core Components/Risk Factors/Patient Goals on Admission    Weight Management Yes;Weight Loss;Obesity    Intervention Weight Management: Develop a combined nutrition and exercise program designed to reach desired caloric intake, while maintaining appropriate intake of nutrient and fiber, sodium and fats, and appropriate energy expenditure required for the weight goal.;Weight Management: Provide education and appropriate resources to help participant work on and attain dietary goals.;Weight Management/Obesity: Establish reasonable short term and long term weight goals.;Obesity: Provide education and appropriate  resources to help participant work on and attain dietary goals.    Admit Weight 238 lb 3.2 oz (108 kg)    Goal Weight: Short Term 233 lb (105.7 kg)    Goal Weight: Long Term 220 lb (99.8 kg)    Expected Outcomes Short Term: Continue to assess and modify interventions until short term weight is achieved;Long Term: Adherence to nutrition and physical activity/exercise program aimed toward attainment of established weight goal;Weight Loss: Understanding of general recommendations for a balanced deficit meal plan, which promotes 1-2 lb weight loss per week and includes a  negative energy balance of [phone] kcal/d;Understanding recommendations for meals to include 15-35% energy as protein, 25-35% energy from fat, 35-60% energy from carbohydrates, less than '200mg'$  of dietary cholesterol, 20-35 gm of total fiber daily;Understanding of distribution of calorie intake throughout the day with the consumption of 4-5 meals/snacks    Hypertension Yes    Intervention Provide education on lifestyle modifcations including regular physical activity/exercise, weight management, moderate sodium restriction and increased consumption of fresh fruit, vegetables, and low fat dairy, alcohol moderation, and smoking cessation.;Monitor prescription use compliance.    Expected Outcomes Long Term: Maintenance of blood pressure at goal levels.;Short Term: Continued assessment and intervention until BP is < 140/33m HG in hypertensive participants. < 130/866mHG in hypertensive participants with diabetes, heart failure or chronic kidney disease.    Lipids Yes    Intervention Provide education and support for participant on nutrition & aerobic/resistive exercise along with prescribed medications to achieve LDL '70mg'$ , HDL >'40mg'$ .    Expected Outcomes Short Term: Participant states understanding of desired cholesterol values and is compliant with medications prescribed. Participant is following exercise prescription and nutrition guidelines.;Long Term: Cholesterol controlled with medications as prescribed, with individualized exercise RX and with personalized nutrition plan. Value goals: LDL < '70mg'$ , HDL > 40 mg.             Education:Diabetes - Individual verbal and written instruction to review signs/symptoms of diabetes, desired ranges of glucose level fasting, after meals and with exercise. Acknowledge that pre and post exercise glucose checks will be done for 3 sessions at entry of program.   Core Components/Risk Factors/Patient Goals Review:    Core Components/Risk Factors/Patient Goals at  Discharge (Final Review):    ITP Comments:  ITP Comments     Row Name 09/14/21 0938 09/19/21 1143 09/21/21 0751 10/05/21 0740     ITP Comments Virtual Visit completed. Patient informed on EP and RD appointment and 6 Minute walk test. Patient also informed of patient health questionnaires on My Chart. Patient Verbalizes understanding. [facility]. Completed 6MWT and gym orientation. Initial ITP created and sent for review to Dr. MaEmily FilbertMedical Director. First full day of exercise!  Patient was oriented to gym and equipment including functions, settings, policies, and procedures.  Patient's individual exercise prescription and treatment plan were reviewed.  All starting workloads were established based on the results of the 6 minute walk test done at initial orientation visit.  The plan for exercise progression was also introduced and progression will be customized based on patient's performance and goals. 30 Day review completed. Medical Director ITP review done, changes made as directed, and signed approval by Medical Director.   New             Comments:

## 2021-10-05 NOTE — Progress Notes (Signed)
Daily Session Note  Patient Details  Name: Edward Pitts MRN: [id number] Date of Birth: April 30, 1960 Referring Provider:   Flowsheet Row Cardiac Rehab from 09/19/2021 in [facility] Cardiac and Pulmonary Rehab  Referring Provider Serafina Royals MD       Encounter Date: 10/05/2021  Check In:  Session Check In - 10/05/21 0742       Check-In   Supervising physician immediately available to respond to emergencies See telemetry face sheet for immediately available ER MD    Location [facility]-Cardiac & Pulmonary Rehab    Staff Present Birdie Sons, MPA, RN;Melissa [place], RDN, LDN;Joseph [place], [place]    Virtual Visit No    Medication changes reported     Yes    Comments added 35m lorazepam tablet    Fall or balance concerns reported    No    Warm-up and Cool-down Performed on first and last piece of equipment    Resistance Training Performed Yes    VAD Patient? No    PAD/SET Patient? No      Pain Assessment   Currently in Pain? No/denies                Social History   Tobacco Use  Smoking Status Never  Smokeless Tobacco Never    Goals Met:  Independence with exercise equipment Exercise tolerated well No report of concerns or symptoms today Strength training completed today  Goals Unmet:  Not Applicable  Comments: Pt able to follow exercise prescription today without complaint.  Will continue to monitor for progression.    Dr. MEmily Filbertis Medical Director for [place]  Dr. FOttie Glazieris Medical Director for [facility].

## 2021-10-06 ENCOUNTER — Encounter
Admission: RE | Admit: 2021-10-06 | Discharge: 2021-10-06 | Disposition: A | Discharge status: Home / Self Care | Payer: Medicare Other | Source: Ambulatory Visit | Attending: Oncology | Admitting: Oncology

## 2021-10-06 ENCOUNTER — Ambulatory Visit
Admission: RE | Admit: 2021-10-06 | Discharge: 2021-10-06 | Disposition: A | Discharge status: Home / Self Care | Payer: Medicare Other | Source: Ambulatory Visit | Attending: Oncology | Admitting: Oncology

## 2021-10-06 DIAGNOSIS — K769 Liver disease, unspecified: Secondary | ICD-10-CM | POA: Insufficient documentation

## 2021-10-06 DIAGNOSIS — K118 Other diseases of salivary glands: Secondary | ICD-10-CM | POA: Insufficient documentation

## 2021-10-06 DIAGNOSIS — C439 Malignant melanoma of skin, unspecified: Secondary | ICD-10-CM

## 2021-10-06 DIAGNOSIS — C4359 Malignant melanoma of other part of trunk: Secondary | ICD-10-CM | POA: Diagnosis not present

## 2021-10-06 DIAGNOSIS — C774 Secondary and unspecified malignant neoplasm of inguinal and lower limb lymph nodes: Secondary | ICD-10-CM | POA: Insufficient documentation

## 2021-10-06 LAB — GLUCOSE, CAPILLARY: Glucose-Capillary: 106 mg/dL — ABNORMAL HIGH (ref 70–99)

## 2021-10-06 MED ORDER — FLUDEOXYGLUCOSE F - 18 (FDG) INJECTION
12.0000 | Freq: Once | INTRAVENOUS | Status: AC | PRN
  Administered 2021-10-06: 13.17 via INTRAVENOUS

## 2021-10-07 ENCOUNTER — Encounter: Payer: Medicare Other | Admitting: *Deleted

## 2021-10-07 DIAGNOSIS — Z951 Presence of aortocoronary bypass graft: Secondary | ICD-10-CM | POA: Diagnosis not present

## 2021-10-07 NOTE — Progress Notes (Signed)
Daily Session Note  Patient Details  Name: Edward Pitts MRN: [id number] Date of Birth: Jan 07, 1961 Referring Provider:   Flowsheet Row Cardiac Rehab from 09/19/2021 in [facility] Cardiac and Pulmonary Rehab  Referring Provider Edward Royals MD       Encounter Date: 10/07/2021  Check In:  Session Check In - 10/07/21 0831       Check-In   Supervising physician immediately available to respond to emergencies See telemetry face sheet for immediately available ER MD    Location [facility]-Cardiac & Pulmonary Rehab    Staff Present Edward Sam, MA, RCEP, CCRP, CCET;Edward Malkowski, RN, BSN, CCRP;Other   Edward Carol MS, ACSM CEP, Exercise Physiologist   Virtual Visit No    Medication changes reported     No    Fall or balance concerns reported    No    Warm-up and Cool-down Performed on first and last piece of equipment    Resistance Training Performed Yes    VAD Patient? No    PAD/SET Patient? No      Pain Assessment   Currently in Pain? No/denies                Social History   Tobacco Use  Smoking Status Never  Smokeless Tobacco Never    Goals Met:  Independence with exercise equipment Exercise tolerated well No report of concerns or symptoms today  Goals Unmet:  Not Applicable  Comments: Pt able to follow exercise prescription today without complaint.  Will continue to monitor for progression.    Dr. Emily Pitts is Medical Director for [place].  Dr. Ottie Pitts is Medical Director for [facility] Pulmonary Rehabilitation.

## 2021-10-08 ENCOUNTER — Other Ambulatory Visit: Payer: Self-pay | Admitting: Oncology

## 2021-10-10 ENCOUNTER — Encounter: Payer: Medicare Other | Admitting: *Deleted

## 2021-10-10 ENCOUNTER — Telehealth: Payer: Self-pay

## 2021-10-10 DIAGNOSIS — Z951 Presence of aortocoronary bypass graft: Secondary | ICD-10-CM | POA: Diagnosis not present

## 2021-10-10 NOTE — Telephone Encounter (Signed)
Patient came to clinic this morning very anxious wanting to speak to Dr. Tasia Catchings regarding PET scan results. Provided pt with copy of Pet scan report and informed that Dr. Tasia Catchings would need to review scan to relay official results. He states that he would like a call back ASAP or be seen in clinic this week for results. He also mentioned that if he needs to restart treatment to keep disease under control he would do so.   Please advise.

## 2021-10-10 NOTE — Progress Notes (Signed)
Daily Session Note  Patient Details  Name: Edward Pitts MRN: [id number] Date of Birth: Apr 01, 1960 Referring Provider:   Flowsheet Row Cardiac Rehab from 09/19/2021 in [facility] Cardiac and Pulmonary Rehab  Referring Provider Serafina Royals MD       Encounter Date: 10/10/2021  Check In:  Session Check In - 10/10/21 0835       Check-In   Supervising physician immediately available to respond to emergencies See telemetry face sheet for immediately available ER MD    Location [facility]-Cardiac & Pulmonary Rehab    Staff Present Heath Lark, RN, BSN, Laveda Norman, BS, ACSM CEP, Exercise Physiologist;Kristen Coble, RN,BC,MSN    Virtual Visit No    Medication changes reported     No    Fall or balance concerns reported    No    Warm-up and Cool-down Performed on first and last piece of equipment    Resistance Training Performed Yes    VAD Patient? No    PAD/SET Patient? No      Pain Assessment   Currently in Pain? No/denies                Social History   Tobacco Use  Smoking Status Never  Smokeless Tobacco Never    Goals Met:  Independence with exercise equipment Exercise tolerated well No report of concerns or symptoms today  Goals Unmet:  Not Applicable  Comments: Pt able to follow exercise prescription today without complaint.  Will continue to monitor for progression.    Dr. Emily Filbert is Medical Director for [place].  Dr. Ottie Glazier is Medical Director for [facility] Pulmonary Rehabilitation.

## 2021-10-12 ENCOUNTER — Inpatient Hospital Stay (HOSPITAL_BASED_OUTPATIENT_CLINIC_OR_DEPARTMENT_OTHER): Payer: Medicare Other | Admitting: Oncology

## 2021-10-12 ENCOUNTER — Encounter: Payer: Self-pay | Admitting: Oncology

## 2021-10-12 DIAGNOSIS — D631 Anemia in chronic kidney disease: Secondary | ICD-10-CM

## 2021-10-12 DIAGNOSIS — F419 Anxiety disorder, unspecified: Secondary | ICD-10-CM

## 2021-10-12 DIAGNOSIS — Z951 Presence of aortocoronary bypass graft: Secondary | ICD-10-CM

## 2021-10-12 DIAGNOSIS — C438 Malignant melanoma of overlapping sites of skin: Secondary | ICD-10-CM | POA: Diagnosis not present

## 2021-10-12 DIAGNOSIS — N1832 Chronic kidney disease, stage 3b: Secondary | ICD-10-CM

## 2021-10-12 DIAGNOSIS — I4891 Unspecified atrial fibrillation: Secondary | ICD-10-CM | POA: Diagnosis not present

## 2021-10-12 DIAGNOSIS — Z7901 Long term (current) use of anticoagulants: Secondary | ICD-10-CM | POA: Diagnosis not present

## 2021-10-12 NOTE — Telephone Encounter (Signed)
Please call pt and schedule to come in for MD visit today, for Pet scan results.

## 2021-10-12 NOTE — Progress Notes (Signed)
Daily Session Note  Patient Details  Name: Edward Pitts MRN: [id number] Date of Birth: 12-03-1960 Referring Provider:   Flowsheet Row Cardiac Rehab from 09/19/2021 in [facility] Cardiac and Pulmonary Rehab  Referring Provider Serafina Royals MD       Encounter Date: 10/12/2021  Check In:  Session Check In - 10/12/21 0751       Check-In   Supervising physician immediately available to respond to emergencies See telemetry face sheet for immediately available ER MD    Location [facility]-Cardiac & Pulmonary Rehab    Staff Present Antionette Fairy, BS, Exercise Physiologist;Kristen Coble, RN,BC,MSN;Jessica Luyando, MA, RCEP, CCRP, CCET;Melissa [place], RDN, LDN    Virtual Visit No    Medication changes reported     No    Fall or balance concerns reported    No    Tobacco Cessation No Change    Warm-up and Cool-down Performed on first and last piece of equipment    Resistance Training Performed Yes    VAD Patient? No    PAD/SET Patient? No      Pain Assessment   Currently in Pain? No/denies    Multiple Pain Sites No                Social History   Tobacco Use  Smoking Status Never  Smokeless Tobacco Never    Goals Met:  Independence with exercise equipment Exercise tolerated well No report of concerns or symptoms today  Goals Unmet:  Not Applicable  Comments: Pt able to follow exercise prescription today without complaint.  Will continue to monitor for progression.    Dr. Emily Filbert is Medical Director for [place].  Dr. Ottie Glazier is Medical Director for [facility] Pulmonary Rehabilitation.

## 2021-10-12 NOTE — Progress Notes (Signed)
Pt here for PET scan results.

## 2021-10-12 NOTE — Assessment & Plan Note (Signed)
Continue Ativan 1 mg every 8 hours as needed for anxiety.

## 2021-10-12 NOTE — Progress Notes (Signed)
Hematology/Oncology Progress note [phone] [phone]      Patient Care Team: [name], MD as PCP - General (Cardiology) [name], MD as Consulting Physician (Oncology) Mickeal Skinner Acey Lav, MD as Consulting Physician (Oncology) [name], MD as Consulting Physician (Cardiology)  ASSESSMENT & PLAN:   Malignant melanoma of overlapping sites [place]) 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  CT image was reviewed and discussed with patient.  Due the presence of pace maker, earliest available MRI is in August. Enhanced liver US findings concerned for non hepatocellular malignancy  PET scan showed hypermetabolic liver nodule, no other sites of metastatic disease. Recommend IR evaluation if the lesion is feasible for biopsy.  Discussed about resuming immunotherapy. Will decide after his biopsy.   Anemia in chronic kidney disease Continue monitor.   Anxiety Continue Ativan 1 mg every 8 hours as needed for anxiety.    No orders of the defined types were placed in this encounter. Follow-up To be determined All questions were answered. The patient knows to call the clinic with any problems, questions or concerns.  [name], MD, PhD [facility] Hematology Oncology 10/12/2021   CHIEF COMPLAINTS/REASON FOR VISIT:  Follow up for melanoma HISTORY OF PRESENTING ILLNESS:   Edward Pitts is a  61 y.o.  male presents for recurrent malignant melanoma.   Oncology History  Malignant melanoma of overlapping sites [facility])  04/16/2019 Cancer Staging   Staging form: Melanoma of the Skin, AJCC 8th Edition - Pathologic: Stage Unknown (rpTX, pN1b, cM0) - Signed by [name], MD on 07/27/2020 Stage prefix: Recurrence    04/23/2019 Initial Diagnosis   Malignant melanoma   -He has a history of left lower  extremity melanoma in 2011, status post local excision -04/16/2019 patient underwent left groin mass resection  Resection pathology showed malignant melanoma, replacing a lymph node, with extracapsular extension, peripheral and deep margins involved.  Left inguinal contents, all 7 lymph nodes were negative for melanoma in the lymph nodes. Extranodal melanoma identified in lymphatic and interstitium between nodes -PDL1 80% TPS    07/07/2019 -  Radiation Therapy   status post adjuvant radiation.   07/23/2019 - 07/21/2021 Chemotherapy   Nivolumab q14d      06/29/2020 Imaging   CT chest abdomen pelvis showed stable postoperative appearance of the left groin.  No evidence of local recurrence.  No evidence of metastatic disease in the chest abdomen or pelvis.  Hepatic steatosis.  Stable subcentimeter fluid attenuation lesion of the lateral right lobe of the liver, likely benign cyst or hemangioma.  Coronary artery disease.  Aortic atherosclerosis   03/10/2021 Imaging   MRI brain without contrast showed no definitive evidence of intracranial metastatic disease.  Study is limited by absence of intravenous contrast.     04/26/2021 Imaging   CT chest abdomen pelvis without contrast showed stable post operative changes of left groin with no evidence of recurrent disease.  No evidence of metastatic disease in the chest abdomen pelvis.  Aortic atherosclerosis   08/08/2021 - 08/16/2021 Hospital Admission    patient was hospitalized due to NSTEMI status post CABG x3.  He also had pacemaker The echocardiogram showed left ventricular ejection fraction of 65 to 70%,   09/15/2021 Imaging   CT chest abdomen pelvis w contrast  IMPRESSION: 1. Subtle hypodense 9 mm lesion in the left lobe of the  liver is new from prior imaging including previous contrasted CT dating back to December 10, 2019, with the lesion appearing to equilibrate with background liver on delayed imaging sequence but is incompletely evaluated on this  imaging study and technically nonspecific possibly reflecting a benign perfusional variant and while its appearance is not typical for that of a melanoma metastasis, it is not excluded on this examination. Suggest more definitive characterization by hepatic protocol MRI with and without contrast. 2. Stable postoperative changes in the left groin without evidence of local recurrent disease.3. No evidence of metastatic disease in the chest or pelvis.4.  Aortic Atherosclerosis (ICD10-I70.0).      09/23/2021 Imaging   Contrast-enhanced liver ultrasound Mildly hypoenhancing 2.2 cm mass in the posterior aspect of the left lobe of the liver with washout characteristics concerning for non hepatocellular malignancy, concerning for melanotic metastasis given history. The lesion is in an unfavorable location for percutaneous biopsy. Consider PET-CT for further characterization   10/20/2021 Imaging   CT chest abdomen pelvis showed stable postoperative/radiation appearance of the left groin.  No evidence of local recurrence/metastatic disease within the chest abdomen/pelvis.  Fatty liver disease.  Diverticulosis without evidence of typhlitis.  Aortic atherosclerosis    04/18/2021, patient establish care with neurology Dr. Mickeal Skinner for intermittent altered cognition.  he was recommended to start Vimpat.    INTERVAL HISTORY Edward Pitts is a 61 y.o. male who has above history reviewed by me today presents for follow up visit for management of inguinal nodal recurrence of melanoma He had PET scan done and presents to discuss results no new complaint.    :Review of Systems  Constitutional:  Negative for appetite change, chills, fatigue, fever and unexpected weight change.  HENT:   Negative for hearing loss and voice change.   Eyes:  Negative for eye problems and icterus.  Respiratory:  Negative for chest tightness, cough and shortness of breath.   Cardiovascular:  Positive for leg swelling. Negative for chest  pain.  Gastrointestinal:  Negative for abdominal distention and abdominal pain.  Endocrine: Negative for hot flashes.  Genitourinary:  Negative for difficulty urinating, dysuria and frequency.   Musculoskeletal:        Status post left hip replacement  Skin:  Negative for itching and rash.       Skin hypo-pigmentation on upper extremities, no change  Neurological:  Negative for light-headedness and numbness.       Spells  Hematological:  Negative for adenopathy. Does not bruise/bleed easily.  Psychiatric/Behavioral:  Negative for confusion.     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   Family history of adverse reaction to anesthesia    PONV mother   GERD (gastroesophageal reflux disease)    History of complete heart block    PPM placed   Hyperlipidemia    Hypertension    LBBB (left bundle branch block)    Lymphedema of left leg    uses thigh high compression stockings   Melanoma ([place]) 2012   skin cancer, left thigh   OSA on CPAP    PONV (postoperative  nausea and vomiting) 04/16/2019   Port-A-Cath in place    RIGHT chest wall   Presence of cardiac pacemaker    [employer]   Seizures (HCC)    still has episodes of dizziness. last event 1 month ago (march 2022) and will pass out. takes clonazepam    SURGICAL HISTORY: Past Surgical History:  Procedure Laterality Date   CORONARY ARTERY BYPASS GRAFT N/A 08/10/2021   Procedure: CORONARY ARTERY BYPASS GRAFTING (CABG) X 3 USING LEFT INTERNAL MAMMARY ARTERY AND RIGHT GREATER SAPHENOUS VEIN;  Surgeon: Dahlia Byes, MD;  Location: [place];  Service: Open Heart Surgery;  Laterality: N/A;   CT RADIATION THERAPY GUIDE      left groin   dental implant     permanent implant   ENDOVEIN HARVEST OF GREATER SAPHENOUS VEIN Right 08/10/2021   Procedure: ENDOVEIN HARVEST OF GREATER SAPHENOUS VEIN;  Surgeon: Dahlia Byes, MD;  Location: [place];  Service: Open Heart Surgery;  Laterality: Right;   KNEE SURGERY Left    arthroscopy   LEFT HEART CATH AND CORONARY ANGIOGRAPHY Left 06/29/2017   Procedure: LEFT HEART CATH AND CORONARY ANGIOGRAPHY;  Surgeon: [name], MD;  Location: [place] [facility];  Service: Cardiovascular;  Laterality: Left;   LEFT HEART CATH AND CORONARY ANGIOGRAPHY N/A 08/08/2021   Procedure: LEFT HEART CATH AND CORONARY ANGIOGRAPHY;  Surgeon: [name], MD;  Location: [place] [facility];  Service: Cardiovascular;  Laterality: N/A;   LYMPH NODE DISSECTION Left 04/16/2019   Procedure: Left inguinal Lymph Node Dissection;  Surgeon: [name], MD;  Location: [place];  Service: General;  Laterality: Left;   MELANOMA EXCISION Left 04/16/2019   Procedure: MELANOMA EXCISION LEFT GROIN MASS;  Surgeon: [name], MD;  Location: [place];  Service: General;  Laterality: Left;   MELANOMA EXCISION WITH SENTINEL LYMPH NODE BIOPSY Left 2012   Left calf    PACEMAKER INSERTION N/A 08/26/2018   Procedure: INSERTION PACEMAKER;  Surgeon: [name], MD;  Location: [facility];  Service: Cardiovascular;  Laterality: N/A;   PORTA CATH INSERTION N/A 08/26/2019   Procedure: PORTA CATH INSERTION;  Surgeon: Katha Cabal, MD;  Location: [place] [facility];  Service: Cardiovascular;  Laterality: N/A;   SUPERFICIAL LYMPH NODE BIOPSY / EXCISION Left 2020   lymph nodes removed around left groin melanoma site   TEE WITHOUT CARDIOVERSION N/A 08/10/2021   Procedure: TRANSESOPHAGEAL ECHOCARDIOGRAM (TEE);  Surgeon: Dahlia Byes, MD;  Location: [place];  Service: Open Heart Surgery;  Laterality: N/A;   TEMPORARY PACEMAKER N/A 08/25/2018   Procedure: TEMPORARY PACEMAKER;  Surgeon: [name], MD;   Location: [place] [facility];  Service: Cardiovascular;  Laterality: N/A;   TOTAL HIP ARTHROPLASTY Left 07/14/2020   Procedure: TOTAL HIP ARTHROPLASTY;  Surgeon: Dereck Leep, MD;  Location: [facility];  Service: Orthopedics;  Laterality: Left;    SOCIAL HISTORY: Social History   Socioeconomic History   Marital status: Married    Spouse name: Vicente Males    Number of children: 7   Years of education: 12   Highest education level: Not on file  Occupational History    Comment: disability  Tobacco Use   Smoking status: Never   Smokeless tobacco: Never  Vaping Use   Vaping Use: Never used  Substance and Sexual Activity   Alcohol use: No   Drug use: No   Sexual activity: Not Currently  Other Topics Concern   Not on file  Social History Narrative   Lives with  Wife,   Has 2 small dogs   Caffeine use: sodas (2 per day)      Out of work on disability.  Has a walk in shower. No stairs to climb   Oncology treatment ongoing. Uses port a cath for treatment.      pacemaker   Social Determinants of Health   Financial Resource Strain: Low Risk  (02/12/2019)   Overall Financial Resource Strain (CARDIA)    Difficulty of Paying Living Expenses: Not hard at all  Food Insecurity: No Food Insecurity (02/12/2019)   Hunger Vital Sign    Worried About Running Out of Food in the Last Year: Never true    Ran Out of Food in the Last Year: Never true  Transportation Needs: Unmet Transportation Needs (02/12/2019)   PRAPARE - Hydrologist (Medical): Yes    Lack of Transportation (Non-Medical): Yes  Physical Activity: Unknown (02/12/2019)   Exercise Vital Sign    Days of Exercise per Week: 0 days    Minutes of Exercise per Session: Not on file  Stress: No Stress Concern Present (02/12/2019)   [place]    Feeling of Stress : Only a little  Social Connections: Unknown (02/12/2019)   Social Connection  and Isolation Panel [NHANES]    Frequency of Communication with Friends and Family: More than three times a week    Frequency of Social Gatherings with Friends and Family: Not on file    Attends Religious Services: Not on file    Active Member of Clubs or Organizations: Not on file    Attends Archivist Meetings: Not on file    Marital Status: Married  Intimate Partner Violence: Not At Risk (02/12/2019)   Humiliation, Afraid, Rape, and Kick questionnaire    Fear of Current or Ex-Partner: No    Emotionally Abused: No    Physically Abused: No    Sexually Abused: No    FAMILY HISTORY: Family History  Problem Relation Age of Onset   Cancer Paternal Grandmother     ALLERGIES:  is allergic to ibuprofen, levetiracetam, and nsaids.  MEDICATIONS:  Current Outpatient Medications  Medication Sig Dispense Refill   acetaminophen (TYLENOL) 500 MG tablet Take 1-2 tablets (500-1,000 mg total) by mouth every 6 (six) hours as needed. 30 tablet 0   allopurinol (ZYLOPRIM) 300 MG tablet Take 300 mg by mouth daily.     apixaban (ELIQUIS) 5 MG TABS tablet Take 1 tablet (5 mg total) by mouth 2 (two) times daily. 60 tablet 3   aspirin 81 MG EC tablet Take 81 mg by mouth daily.     atorvastatin (LIPITOR) 20 MG tablet Take 20 mg by mouth daily.     Carboxymeth-Glyc-Polysorb PF (REFRESH OPTIVE MEGA-3) 0.5-1-0.5 % SOLN Place 1 drop into both eyes daily as needed (dry eyes).     hydrochlorothiazide (HYDRODIURIL) 25 MG tablet      hydrocortisone 2.5 % ointment Apply 1 application. topically daily as needed (itching).     lidocaine-prilocaine (EMLA) cream Apply 1 application. topically as needed. Apply small amount of cream to port site approx 1-2 hours prior to appointment. 30 g 11   lisinopril (ZESTRIL) 20 MG tablet Take 20 mg by mouth daily.     LORazepam (ATIVAN) 1 MG tablet Take 1 tablet (1 mg total) by mouth every 8 (eight) hours as needed for anxiety. 45 tablet 0   metoprolol succinate  (TOPROL-XL) 25 MG 24 hr  tablet Take 25 mg by mouth daily.     Multiple Vitamin (MULTIVITAMIN WITH MINERALS) TABS tablet Take 1 tablet by mouth daily. Centrum Silver     omeprazole (PRILOSEC) 20 MG capsule TAKE 1 CAPSULE BY MOUTH EVERY DAY 90 capsule 0   ondansetron (ZOFRAN) 4 MG tablet TAKE 1 TABLET BY MOUTH EVERY 8 HOURS AS NEEDED FOR NAUSEA AND VOMITING 90 tablet 1   VIMPAT 100 MG TABS TAKE 1 TABLET (100 MG TOTAL) BY MOUTH IN THE MORNING AND AT BEDTIME. 60 tablet 3   amiodarone (PACERONE) 200 MG tablet Take 2 tablets (400 mg total) by mouth 2 (two) times daily. X 5 days, then decrease to 200 mg BID (Patient not taking: Reported on 09/19/2021) 90 tablet 1   clonazePAM (KLONOPIN) 0.5 MG tablet 1 TABLET IN THE MORNING, 2 IN THE EVENING (Patient not taking: Reported on 09/19/2021) 90 tablet 0   HYDROcodone-acetaminophen (NORCO/VICODIN) 5-325 MG tablet  (Patient not taking: Reported on 09/19/2021)     LORazepam (ATIVAN) 2 MG tablet Take 1 tablet (2 mg total) by mouth every 8 (eight) hours as needed for seizure. (Patient not taking: Reported on 09/19/2021) 20 tablet 0   No current facility-administered medications for this visit.     PHYSICAL EXAMINATION: ECOG PERFORMANCE STATUS: 1 - Symptomatic but completely ambulatory Vitals:   10/12/21 0956  BP: 138/77  Pulse: 79  Resp: 18  Temp: 97.6 F (36.4 C)   Filed Weights   10/12/21 0956  Weight: 234 lb 9.6 oz (106.4 kg)    Physical Exam Constitutional:      General: He is not in acute distress.    Comments: Patient ambulates independently  HENT:     Head: Normocephalic and atraumatic.  Eyes:     General: No scleral icterus.    Pupils: Pupils are equal, round, and reactive to light.  Cardiovascular:     Rate and Rhythm: Normal rate and regular rhythm.     Heart sounds: Normal heart sounds.  Pulmonary:     Effort: Pulmonary effort is normal. No respiratory distress.     Breath sounds: No wheezing.  Abdominal:     General: Bowel sounds  are normal. There is no distension.     Palpations: Abdomen is soft. There is no mass.     Tenderness: There is no abdominal tenderness.     Comments:    Musculoskeletal:        General: No deformity. Normal range of motion.     Cervical back: Normal range of motion and neck supple.     Comments: Left lower extremity edema-chronic   Skin:    General: Skin is warm and dry.  Neurological:     Mental Status: He is alert and oriented to person, place, and time. Mental status is at baseline.     Cranial Nerves: No cranial nerve deficit.     Coordination: Coordination normal.  Psychiatric:        Mood and Affect: Mood normal.     LABORATORY DATA:  I have reviewed the data as listed    Latest Ref Rng & Units 09/15/2021    8:27 AM 08/15/2021    6:05 AM 08/14/2021    3:51 AM  CBC  WBC 4.0 - 10.5 K/uL 5.2  9.0  9.8   Hemoglobin 13.0 - 17.0 g/dL 9.9  10.5  8.3   Hematocrit 39.0 - 52.0 % 30.8  31.2  22.9   Platelets 150 - 400 K/uL 227  215  180       Latest Ref Rng & Units 09/15/2021    8:27 AM 08/16/2021    6:47 AM 08/15/2021    6:05 AM  CMP  Glucose 70 - 99 mg/dL 137  121  114   BUN 8 - 23 mg/dL 27  33  35   Creatinine 0.61 - 1.24 mg/dL 1.68  1.55  1.75   Sodium 135 - 145 mmol/L 140  138  142   Potassium 3.5 - 5.1 mmol/L 4.0  4.3  4.3   Chloride 98 - 111 mmol/L 108  105  108   CO2 22 - 32 mmol/L $RemoveB'23  23  25   'WgYNUjLc$ Calcium 8.9 - 10.3 mg/dL 9.1  8.9  9.1   Total Protein 6.5 - 8.1 g/dL 7.5     Total Bilirubin 0.3 - 1.2 mg/dL 0.5     Alkaline Phos 38 - 126 U/L 105     AST 15 - 41 U/L 23     ALT 0 - 44 U/L 30        RADIOGRAPHIC STUDIES: I have personally reviewed the radiological images as listed and agreed with the findings in the report. NM PET Image Restage (PS) Whole Body  Result Date: 10/08/2021 CLINICAL DATA:  Subsequent treatment strategy for metastatic melanoma with left inguinal nodal recurrence. New liver lesion. EXAM: NUCLEAR MEDICINE PET WHOLE BODY TECHNIQUE: 13.2 mCi  F-18 FDG was injected intravenously. Full-ring PET imaging was performed from the head to foot after the radiotracer. CT data was obtained and used for attenuation correction and anatomic localization. Fasting blood glucose: 106 mg/dl COMPARISON:  PET-CT 02/11/2019. CT of the chest, abdomen and pelvis 09/15/2021. FINDINGS: Mediastinal blood pool activity: SUV max 3.2 HEAD/ NECK: There is a small hypermetabolic nodule posteriorly in the superficial portion of the left parotid gland (SUV max 3.4). This appears similar to the previous PET-CT and is likely an incidental parotid lesion. No other hypermetabolic cervical lymph nodes are seen.There are no lesions of the pharyngeal mucosal space. Incidental CT findings: none CHEST: There are no hypermetabolic mediastinal, hilar or axillary lymph nodes. No hypermetabolic pulmonary activity or suspicious nodularity. Incidental CT findings: There is hypermetabolic activity within the median sternotomy, likely physiologic. The median sternotomy has incompletely healed, but demonstrates no dehiscence. Right IJ Port-A-Cath and left subclavian pacemaker leads are in place. ABDOMEN/PELVIS: There is focal hypermetabolic activity in the area of concern within the left hepatic lobe. The new lesion seen on CT is not well demonstrated on the CT images, but there is focal hypermetabolic activity in this area (SUV max 4.5). No other hypermetabolic liver lesions are seen. There is no hypermetabolic activity within the adrenal glands, pancreas or spleen. There is no hypermetabolic nodal activity. Previously demonstrated large left nodal mass has been surgically removed, and no residual hypermetabolic activity is present in this area. Incidental CT findings: Stable umbilical hernia containing only fat, colonic diverticulosis and aortic atherosclerosis. SKELETON: There is no hypermetabolic activity to suggest osseous metastatic disease. Incidental CT findings: none EXTREMITIES: There is  low-level hypermetabolic activity within the subcutaneous fat medial to the left knee, corresponding with surgical clips and attributed to previous saphenous venous harvesting for CABG. Correlate clinically. No other suspicious soft tissue activity within the extremities. Incidental CT findings: none IMPRESSION: 1. The new lesion of concern in the left hepatic lobe is not optimally evaluated based on small size, but does demonstrate hypermetabolic activity, suspicious for metastatic disease. Assuming the patient is unable to undergo  MRI due to his pacemaker, recommend attention on follow-up CT in 3 months. 2. No other evidence of metastatic disease. 3. 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 Electronically Signed   By: Richardean Sale M.D.   On: 10/08/2021 15:01   US LIVER W/CM 1ST LESION  Result Date: 09/23/2021 CLINICAL DATA:  60 year old male with history of melanoma with development of new left lobe lesion measuring up to 9 mm on recent staging CT. The patient presents for contrast enhanced ultrasound for further characterization. EXAM: US LIVER WITH CONTRAST CONTRAST:  2.4 mL sulfur hexafluoride Number of injections: 1 COMPARISON:  09/15/2021 FINDINGS: LIVER Size: Normal. Echogenicity: Within normal limits. Surface Contour: Smooth. Observation 1 Lobe: Left Size: 2.2 cm Grayscale features: Hypoechoic with isoechoic center. Arterial phase enhancement features: Mildly hypoenhancing Onset of washout: 60 seconds Degree of washout: Marked Tumor in vein: No CEUS LI-RADS category: Not applicable in the absence of cirrhosis. IMPRESSION: Mildly hypoenhancing 2.2 cm mass in the posterior aspect of the left lobe of the liver with washout characteristics concerning for non hepatocellular malignancy, concerning for melanotic metastasis given history. The lesion is in an unfavorable location for percutaneous biopsy. Consider PET-CT for further  characterization. Ruthann Cancer, MD [facility] Radiology Electronically Signed   By: Ruthann Cancer M.D.   On: 09/23/2021 15:39   CT CHEST ABDOMEN PELVIS W CONTRAST  Result Date: 09/15/2021 CLINICAL DATA:  History of melanoma, follow-up. * Tracking Code: BO * EXAM: CT CHEST, ABDOMEN, AND PELVIS WITH CONTRAST TECHNIQUE: Multidetector CT imaging of the chest, abdomen and pelvis was performed following the standard protocol during bolus administration of intravenous contrast. RADIATION DOSE REDUCTION: This exam was performed according to the departmental dose-optimization program which includes automated exposure control, adjustment of the mA and/or kV according to patient size and/or use of iterative reconstruction technique. CONTRAST:  3mL OMNIPAQUE IOHEXOL 300 MG/ML  SOLN COMPARISON:  Multiple priors including most recent CT April 26, 2021 FINDINGS: CT CHEST FINDINGS Cardiovascular: Accessed right chest Port-A-Cath with tip near the superior cavoatrial junction. Left chest cardiac conduction device with leads in the right atrium and right ventricle. Interval median sternotomy and CABG. Aortic atherosclerosis without aneurysmal dilation. No central pulmonary embolus on this nondedicated study. Normal size heart. Trace pericardial effusion there are postoperative. Mediastinum/Nodes: Mild stranding in the anterior mediastinum most likely reflect sequela of interval CABG. Hypodense 15 mm nodule in the right lobe of the thyroid on image 6/2. No pathologically enlarged mediastinal, hilar or axillary lymph nodes. Esophagus is grossly unremarkable. Lungs/Pleura: No suspicious pulmonary nodules or masses. No focal airspace consolidation. No pleural effusion. No pneumothorax. Musculoskeletal: No aggressive lytic or blastic lesion of bone. Median sternotomy wires are intact. Anterior chest wall incision. Bilateral gynecomastia. CT ABDOMEN PELVIS FINDINGS Hepatobiliary:  Subtle 9 mm hypodense lesion in the left lobe of the liver on image 55/2 is new from prior imaging including contrasted CTs dating back to December 10, 2019, with the lesion appearing to equilibrate to background liver on delayed imaging. Similar tiny low-density lesion in the right lobe of the liver are too small to accurately characterize but stable over multiple priors and favored to reflect a cyst. No solid enhancing hepatic lesion. Gallbladder is unremarkable. No biliary ductal dilation. Pancreas: No pancreatic ductal dilation or evidence of acute inflammation. Spleen: No splenomegaly or focal splenic lesion. Adrenals/Urinary Tract: Bilateral adrenal glands appear normal. No hydronephrosis. Hypodense 2 cm right renal cyst is considered  benign requiring no independent imaging follow-up. Urinary bladder is unremarkable for degree of distension within limitation of streak artifact from left hip arthroplasty. Stomach/Bowel: Radiopaque enteric contrast material traverses the splenic flexure. Stomach is unremarkable for degree of distension. No pathologic dilation of small or large bowel. Left-sided colonic diverticulosis without findings of acute diverticulitis. No evidence of acute bowel inflammation. Vascular/Lymphatic: Aortic atherosclerosis without abdominal aortic aneurysm. No pathologically enlarged abdominal or pelvic lymph nodes. Reproductive: Prostate is unremarkable. Other: Similar postsurgical changes in the left groin without new enhancing soft tissue in the surgical bed. Moderate-sized fat containing ventral hernia. Musculoskeletal: Prior left total hip arthroplasty. No aggressive lytic or blastic lesion of bone. IMPRESSION: 1. Subtle hypodense 9 mm lesion in the left lobe of the liver is new from prior imaging including previous contrasted CT dating back to December 10, 2019, with the lesion appearing to equilibrate with background liver on delayed imaging sequence but is incompletely evaluated on  this imaging study and technically nonspecific possibly reflecting a benign perfusional variant and while its appearance is not typical for that of a melanoma metastasis, it is not excluded on this examination. Suggest more definitive characterization by hepatic protocol MRI with and without contrast. 2. Stable postoperative changes in the left groin without evidence of local recurrent disease. 3. No evidence of metastatic disease in the chest or pelvis. 4.  Aortic Atherosclerosis (ICD10-I70.0). Electronically Signed   By: Dahlia Bailiff M.D.   On: 09/15/2021 16:33   DG Chest 2 View  Result Date: 09/14/2021 CLINICAL DATA:  Status post coronary artery bypass grafts EXAM: CHEST - 2 VIEW COMPARISON:  08/14/2021 FINDINGS: Transverse diameter of heart is slightly increased. There is previous coronary artery bypass surgery. Pacemaker battery is seen in the left infraclavicular region with tips of leads in right atrium and right ventricle. Tip of right IJ chest port is seen in the superior vena cava. IMPRESSION: There are no new infiltrates or signs of pulmonary edema. Electronically Signed   By: Elmer Picker M.D.   On: 09/14/2021 13:49   DG Chest 2 View  Result Date: 08/14/2021 CLINICAL DATA:  Elevated myocardial infarction. EXAM: CHEST - 2 VIEW COMPARISON:  Aug 13, 2021 FINDINGS: Stable right Port-A-Cath in good position. Stable pacemaker. The cardiomediastinal silhouette is stable. Sternotomy wires are intact. No pneumothorax. No nodules or masses. No focal infiltrates. No overt edema. IMPRESSION: No active cardiopulmonary disease. Electronically Signed   By: Dorise Bullion III M.D.   On: 08/14/2021 08:51   DG Chest Port 1 View  Result Date: 08/13/2021 CLINICAL DATA:  Status post coronary artery bypass surgery EXAM: PORTABLE CHEST 1 VIEW COMPARISON:  Previous studies including the examination of 08/12/2021 FINDINGS: Left chest tube is seen. There is no demonstrable pneumothorax. There is interval  decrease in linear densities in the medial left lower lung fields. There are no signs of pulmonary edema or new focal infiltrates. There is coronary bypass surgery. Tip of right IJ chest port is seen in the superior vena cava. Pacemaker battery is seen in the left infraclavicular region. There is interval removal of vascular sheath from right IJ. IMPRESSION: There is slight improvement in subsegmental atelectasis seen in the left lower lung fields. Other findings as described in the body of the report. Electronically Signed   By: Elmer Picker M.D.   On: 08/13/2021 09:43   DG Chest Port 1 View  Result Date: 08/12/2021 CLINICAL DATA:  History of CABG. EXAM: PORTABLE CHEST 1 VIEW COMPARISON:  Aug 11, 2021. FINDINGS: RIGHT IJ vascular sheath remains in place terminating in the region of the brachiocephalic junction. RIGHT-sided Port-A-Cath with tip in the area of the caval to atrial junction. LEFT sided chest tube in place, tip near the LEFT lung apex. Lower mediastinal drain tip projecting over the lower chest/upper abdomen on the radiograph. LEFT-sided dual lead pacer device remains in place. Post median sternotomy for CABG. No change in cardiomediastinal contours. Lung volumes are slightly increased following extubation. No visible pneumothorax. No dense consolidation with subtle basilar opacities LEFT greater than RIGHT and with slight improvement since the prior study. On limited assessment no acute skeletal findings. IMPRESSION: 1. Status post extubation with improved lung volumes. 2. No pneumothorax. 3. Slight improvement in aeration of the lung bases with subtle basilar opacities LEFT greater than RIGHT, likely attributed to atelectasis. 4. Removal of Swan-Ganz catheter, vascular sheath remains in place via RIGHT IJ approach. Electronically Signed   By: Zetta Bills M.D.   On: 08/12/2021 07:56   DG Chest Port 1 View  Result Date: 08/11/2021 CLINICAL DATA:  Status post CABG yesterday. EXAM:  PORTABLE CHEST 1 VIEW COMPARISON:  Chest x-ray from yesterday. FINDINGS: Unchanged endotracheal tube, right internal jugular Swan-Ganz catheter, right chest wall port catheter, mediastinal drain, and left chest tube. Unchanged left chest wall pacemaker. Stable cardiomediastinal silhouette status post CABG. Unchanged retrocardiac left lower lobe atelectasis. No focal consolidation, pleural effusion, or pneumothorax. IMPRESSION: 1. Stable support lines and tubes. 2. Unchanged left lower lobe atelectasis. No pneumothorax. Electronically Signed   By: Titus Dubin M.D.   On: 08/11/2021 07:52   DG Abd 1 View  Result Date: 08/10/2021 CLINICAL DATA:  Encounter for OG tube placement. EXAM: ABDOMEN - 1 VIEW COMPARISON:  Recent similar study today at 9:51 p.m. FINDINGS: 10:48 p.m., 08/10/2021. OG tube is in place interval advanced with tip now in the body of the stomach and appears adequately inserted. The bowel pattern is nonobstructive as far as visualized with the pelvic bowel not included in the exam. The lateral aspect of the right hemiabdomen was also cropped out. There is no supine evidence of free air. The visible visceral shadows are stable. No pathologic calcification is visible. IMPRESSION: OGT has been advanced with the tip now in the body of the stomach, grossly adequately inserted. Electronically Signed   By: Telford Nab M.D.   On: 08/10/2021 23:26   DG Abd 1 View  Result Date: 08/10/2021 CLINICAL DATA:  Check gastric catheter placement EXAM: ABDOMEN - 1 VIEW COMPARISON:  None Available. FINDINGS: Gastric catheter is noted with the tip in the stomach. Proximal side port lies in the distal esophagus. This should be advanced several cm deeper into the stomach. IMPRESSION: Gastric catheter as described. This should be advanced deeper into the stomach. Electronically Signed   By: Inez Catalina M.D.   On: 08/10/2021 21:58   DG Chest Port 1 View  Result Date: 08/10/2021 CLINICAL DATA:  Intubation EXAM:  PORTABLE CHEST 1 VIEW COMPARISON:  08/10/2021 at 3:21 p.m. FINDINGS: Single frontal view of the chest demonstrates endotracheal tube overlying tracheal air column tip just below thoracic inlet. Right chest wall port and flow directed right internal jugular central venous catheter unchanged. Left chest tube and mediastinal drain are again noted and stable. Epicardial pacing wires and dual lead pacemaker are unchanged. Postsurgical changes from CABG. Cardiac silhouette is unremarkable. Stable consolidation within the medial left lung base consistent with atelectasis. No effusion or pneumothorax. No acute bony abnormalities. IMPRESSION: 1.  Support devices as above, with appropriate position of the endotracheal tube just below thoracic inlet. 2. Continued left lower lobe atelectasis. 3. No evidence of pneumothorax. Electronically Signed   By: Randa Ngo M.D.   On: 08/10/2021 17:14   DG Chest Port 1 View  Result Date: 08/10/2021 CLINICAL DATA:  Pneumothorax. EXAM: PORTABLE CHEST 1 VIEW COMPARISON:  Radiographs 08/10/2021 and 08/05/2021.  CT 04/26/2021. FINDINGS: 1521 hours. Interval median sternotomy and CABG. Tip of the endotracheal tube is just above the carina and could be pulled back 3 cm for more optimal positioning. Right IJ Swan-Ganz catheter extends into the main pulmonary artery. A right IJ Port-A-Cath appears unchanged at the level of the upper right atrium. Left subclavian pacemaker leads appear unchanged. A left-sided chest tube is in place. There are low lung volumes with mild left lung atelectasis. No evidence of pneumothorax or significant pleural effusion. The heart size is stable. IMPRESSION: 1. Interval CABG without evidence of pneumothorax. 2. Support system positioned as above. Tip of the endotracheal tube is just above the carina and could be pulled back 3 cm for more optimal positioning. Electronically Signed   By: Richardean Sale M.D.   On: 08/10/2021 15:38   DG Chest 2 View  Result  Date: 08/10/2021 CLINICAL DATA:  Scheduled for CABG May 17. Preoperative chest radiograph. EXAM: CHEST - 2 VIEW COMPARISON:  Portable chest 08/05/2021 FINDINGS: The heart size and mediastinal contours are within normal limits. Both lungs are clear. The visualized skeletal structures are unremarkable. A right IJ port catheter again terminates at the level of the cavoatrial junction. There is a left chest dual lead pacing system with stable wire insertions. IMPRESSION: No evidence of acute chest disease or interval changes. Electronically Signed   By: Telford Nab M.D.   On: 08/10/2021 00:33   VAS US DOPPLER PRE CABG  Result Date: 08/10/2021 PREOPERATIVE VASCULAR EVALUATION Patient Name:  ABDI HUSAK  Date of Exam:   08/09/2021 Medical Rec #: [id number]     Accession #:    [id number] Date of Birth: 02/28/1961     Patient Gender: M Patient Age:   81 years Exam Location:  [facility] Procedure:      VAS US DOPPLER PRE CABG Referring Phys: Collier Salina VANTRIGT --------------------------------------------------------------------------------  Indications:      Pre-CABG. Risk Factors:     Hypertension, hyperlipidemia, no history of smoking, prior MI,                   coronary artery disease. Comparison Study: No prior studies Performing Technologist: Darlin Coco RDMS, RVT  Examination Guidelines: A complete evaluation includes B-mode imaging, spectral Doppler, color Doppler, and power Doppler as needed of all accessible portions of each vessel. Bilateral testing is considered an integral part of a complete examination. Limited examinations for reoccurring indications may be performed as noted.  Right Carotid Findings: +----------+--------+--------+--------+--------+------------------+           PSV cm/sEDV cm/sStenosisDescribeComments           +----------+--------+--------+--------+--------+------------------+ CCA Prox  130     20                                          +----------+--------+--------+--------+--------+------------------+ CCA Distal108     25                      intimal thickening +----------+--------+--------+--------+--------+------------------+  ICA Prox  102     22      1-39%           intimal thickening +----------+--------+--------+--------+--------+------------------+ ICA Distal104     31                                         +----------+--------+--------+--------+--------+------------------+ ECA       99      15                                         +----------+--------+--------+--------+--------+------------------+ +----------+--------+-------+---------+------------+           PSV cm/sEDV cmsDescribe Arm Pressure +----------+--------+-------+---------+------------+ Subclavian198            Turbulent             +----------+--------+-------+---------+------------+ +---------+--------+--+--------+--+---------+ VertebralPSV cm/s43EDV cm/s13Antegrade +---------+--------+--+--------+--+---------+ Left Carotid Findings: +----------+--------+--------+--------+------------+--------+           PSV cm/sEDV cm/sStenosisDescribe    Comments +----------+--------+--------+--------+------------+--------+ CCA Prox  161     34                                   +----------+--------+--------+--------+------------+--------+ CCA Distal150     31                                   +----------+--------+--------+--------+------------+--------+ ICA Prox  98      29      1-39%   heterogenous         +----------+--------+--------+--------+------------+--------+ ICA Distal106     41                                   +----------+--------+--------+--------+------------+--------+ ECA       167     21                                   +----------+--------+--------+--------+------------+--------+ +----------+--------+--------+---------+------------+ SubclavianPSV cm/sEDV cm/sDescribe Arm  Pressure +----------+--------+--------+---------+------------+           201             Turbulent             +----------+--------+--------+---------+------------+ +---------+--------+--+--------+-+---------+ VertebralPSV cm/s49EDV cm/s9Antegrade +---------+--------+--+--------+-+---------+  ABI Findings: +--------+------------------+-----+---------+--------+ Right   Rt Pressure (mmHg)IndexWaveform Comment  +--------+------------------+-----+---------+--------+ Brachial                       triphasic         +--------+------------------+-----+---------+--------+ PTA                            triphasic         +--------+------------------+-----+---------+--------+ DP                             triphasic         +--------+------------------+-----+---------+--------+ +--------+------------------+-----+---------+-------+ Left    Lt Pressure (mmHg)IndexWaveform Comment +--------+------------------+-----+---------+-------+ Brachial  triphasic        +--------+------------------+-----+---------+-------+ PTA                            triphasic        +--------+------------------+-----+---------+-------+ DP                             triphasic        +--------+------------------+-----+---------+-------+  Right Doppler Findings: +--------+--------+-----+---------+--------+ Site    PressureIndexDoppler  Comments +--------+--------+-----+---------+--------+ Brachial             triphasic         +--------+--------+-----+---------+--------+ Radial               triphasic         +--------+--------+-----+---------+--------+ Ulnar                triphasic         +--------+--------+-----+---------+--------+  Left Doppler Findings: +--------+--------+-----+---------+--------+ Site    PressureIndexDoppler  Comments +--------+--------+-----+---------+--------+ Brachial             triphasic          +--------+--------+-----+---------+--------+ Radial               triphasic         +--------+--------+-----+---------+--------+ Ulnar                triphasic         +--------+--------+-----+---------+--------+  Summary: Right Carotid: Velocities in the right ICA are consistent with a 1-39% stenosis. Left Carotid: Velocities in the left ICA are consistent with a 1-39% stenosis. Vertebrals:  Bilateral vertebral arteries demonstrate antegrade flow. Subclavians: Bilateral subclavian artery flow was disturbed. Right Upper Extremity: Doppler waveforms remain within normal limits with right radial compression. Doppler waveforms remain within normal limits with right ulnar compression. Left Upper Extremity: Doppler waveforms remain within normal limits with left radial compression. Doppler waveforms remain within normal limits with left ulnar compression.  Electronically signed by Harold Barban MD on 08/10/2021 at 12:10:21 AM.    Final    ECHOCARDIOGRAM COMPLETE  Result Date: 08/08/2021    ECHOCARDIOGRAM REPORT   Patient Name:   MAX NUNO Date of Exam: 08/08/2021 Medical Rec #:  [id number]    Height:       67.0 in Accession #:    [id number]   Weight:       250.0 lb Date of Birth:  01/04/1961    BSA:          2.223 m Patient Age:    21 years     BP:           138/66 mmHg Patient Gender: M            HR:           68 bpm. Exam Location:  [facility] Procedure: 2D Echo, Cardiac Doppler and Color Doppler Indications:     NSTEMI I21.4  History:         Patient has prior history of Echocardiogram examinations, most                  recent 08/26/2018. CAD, Pacemaker, Arrythmias:LBBB; Risk                  Factors:Hypertension.  Sonographer:     Sherrie Sport Referring Phys:  [id number] VZSMOLMB AGBATA Diagnosing Phys: Yolonda Kida MD  Sonographer Comments: Suboptimal apical window and  no subcostal window. IMPRESSIONS  1. Left ventricular ejection fraction, by estimation, is 65 to 70%. The left ventricle has normal  function. The left ventricle has no regional wall motion abnormalities. Left ventricular diastolic parameters are consistent with Grade I diastolic dysfunction (impaired relaxation).  2. Right ventricular systolic function is normal. The right ventricular size is normal.  3. The mitral valve is normal in structure. Trivial mitral valve regurgitation.  4. The aortic valve is normal in structure. Aortic valve regurgitation is not visualized. FINDINGS  Left Ventricle: Left ventricular ejection fraction, by estimation, is 65 to 70%. The left ventricle has normal function. The left ventricle has no regional wall motion abnormalities. The left ventricular internal cavity size was normal in size. There is  no left ventricular hypertrophy. Left ventricular diastolic parameters are consistent with Grade I diastolic dysfunction (impaired relaxation). Right Ventricle: The right ventricular size is normal. No increase in right ventricular wall thickness. Right ventricular systolic function is normal. Left Atrium: Left atrial size was normal in size. Right Atrium: Right atrial size was normal in size. Pericardium: There is no evidence of pericardial effusion. Mitral Valve: The mitral valve is normal in structure. Trivial mitral valve regurgitation. MV peak gradient, 3.9 mmHg. The mean mitral valve gradient is 2.0 mmHg. Tricuspid Valve: The tricuspid valve is normal in structure. Tricuspid valve regurgitation is trivial. Aortic Valve: The aortic valve is normal in structure. Aortic valve regurgitation is not visualized. Aortic valve mean gradient measures 5.0 mmHg. Aortic valve peak gradient measures 8.4 mmHg. Aortic valve area, by VTI measures 2.02 cm. Pulmonic Valve: The pulmonic valve was normal in structure. Pulmonic valve regurgitation is not visualized. Aorta: The ascending aorta was not well visualized. IAS/Shunts: No atrial level shunt detected by color flow Doppler.  LEFT VENTRICLE PLAX 2D LVIDd:         5.40 cm    Diastology LVIDs:         3.40 cm   LV e' medial:    7.94 cm/s LV PW:         1.30 cm   LV E/e' medial:  9.8 LV IVS:        0.80 cm   LV e' lateral:   5.87 cm/s LVOT diam:     2.00 cm   LV E/e' lateral: 13.3 LV SV:         64 LV SV Index:   29 LVOT Area:     3.14 cm  RIGHT VENTRICLE RV Basal diam:  3.90 cm RV S prime:     15.30 cm/s TAPSE (M-mode): 2.6 cm LEFT ATRIUM             Index        RIGHT ATRIUM           Index LA diam:        3.70 cm 1.66 cm/m   RA Area:     14.60 cm LA Vol (A2C):   46.8 ml 21.05 ml/m  RA Volume:   37.60 ml  16.91 ml/m LA Vol (A4C):   55.1 ml 24.78 ml/m LA Biplane Vol: 53.6 ml 24.11 ml/m  AORTIC VALVE                     PULMONIC VALVE AV Area (Vmax):    2.19 cm      PV Vmax:        1.16 m/s AV Area (Vmean):   1.96 cm      PV Vmean:  71.200 cm/s AV Area (VTI):     2.02 cm      PV VTI:         0.259 m AV Vmax:           145.00 cm/s   PV Peak grad:   5.4 mmHg AV Vmean:          103.450 cm/s  PV Mean grad:   2.0 mmHg AV VTI:            0.317 m       RVOT Peak grad: 9 mmHg AV Peak Grad:      8.4 mmHg AV Mean Grad:      5.0 mmHg LVOT Vmax:         101.00 cm/s LVOT Vmean:        64.700 cm/s LVOT VTI:          0.204 m LVOT/AV VTI ratio: 0.64  AORTA Ao Root diam: 3.10 cm MITRAL VALVE               TRICUSPID VALVE MV Area (PHT): 3.31 cm    TR Peak grad:   17.5 mmHg MV Area VTI:   1.95 cm    TR Vmax:        209.00 cm/s MV Peak grad:  3.9 mmHg MV Mean grad:  2.0 mmHg    SHUNTS MV Vmax:       0.99 m/s    Systemic VTI:  0.20 m MV Vmean:      61.8 cm/s   Systemic Diam: 2.00 cm MV Decel Time: 229 msec    Pulmonic VTI:  0.337 m MV E velocity: 78.00 cm/s MV A velocity: 91.70 cm/s MV E/A ratio:  0.85 Yolonda Kida MD Electronically signed by Yolonda Kida MD Signature Date/Time: 08/08/2021/5:32:25 PM    Final    CARDIAC CATHETERIZATION  Result Date: 08/08/2021   Mid RCA lesion is 45% stenosed.   Mid LM lesion is 80% stenosed.   The left ventricular systolic function is normal.    LV end diastolic pressure is normal.   The left ventricular ejection fraction is greater than 65% by visual estimate. 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 Normal LV systolic function with mild septal hypokinesis due to pacemaker with ejection fraction of 50% Significant progression of distal left main coronary atherosclerosis to 75 to 80% and proximal right coronary artery to 40% Plan Proceed to coronary artery bypass grafting due to significant progression of left main coronary artery disease, non-ST elevation myocardial infarction, and abnormal stress test 3/23 High intensity cholesterol therapy Cardiac rehabilitation Further treatment options after above   DG Chest Port 1 View  Result Date: 08/05/2021 CLINICAL DATA:  Chest pain and dizziness after mowing the yard. EXAM: PORTABLE CHEST 1 VIEW COMPARISON:  05/27/2021 FINDINGS: The right IJ power port is stable. The pacer wires are stable. The cardiac silhouette, mediastinal and hilar contours are within normal limits and unchanged. The lungs are clear. No pleural effusions. No pulmonary lesions. The bony thorax is intact. IMPRESSION: No acute cardiopulmonary findings. Electronically Signed   By: Marijo Sanes M.D.   On: 08/05/2021 12:13

## 2021-10-12 NOTE — Assessment & Plan Note (Addendum)
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  CT image was reviewed and discussed with patient.  Due the presence of pace maker, earliest available MRI is in August. Enhanced liver US findings concerned for non hepatocellular malignancy  PET scan showed hypermetabolic liver nodule, no other sites of metastatic disease. Recommend IR evaluation if the lesion is feasible for biopsy.  Discussed about resuming immunotherapy. Will decide after his biopsy.  Also recommend brain MRI

## 2021-10-12 NOTE — Assessment & Plan Note (Signed)
Continue monitor

## 2021-10-14 ENCOUNTER — Encounter: Payer: Medicare Other | Admitting: *Deleted

## 2021-10-14 ENCOUNTER — Telehealth: Payer: Self-pay

## 2021-10-14 ENCOUNTER — Telehealth: Payer: Self-pay | Admitting: Oncology

## 2021-10-14 DIAGNOSIS — Z951 Presence of aortocoronary bypass graft: Secondary | ICD-10-CM

## 2021-10-14 DIAGNOSIS — C438 Malignant melanoma of overlapping sites of skin: Secondary | ICD-10-CM

## 2021-10-14 NOTE — Telephone Encounter (Signed)
Open in error

## 2021-10-14 NOTE — Telephone Encounter (Signed)
LVM for pt with appy info for 7/28 and 8/17am

## 2021-10-14 NOTE — Telephone Encounter (Signed)
Per Dr. Tasia Catchings [facility] message: please schedule him for lab MD nivolumab next week any day for [facility].  let him know that radiology says the lesion is too small and we will repeat imaging in the future. meanwhile, I will resume him on immunotherapy. we discussed this possibility.   Pt informed and verbalized understanding.

## 2021-10-14 NOTE — Progress Notes (Signed)
Daily Session Note  Patient Details  Name: Edward Pitts MRN: [id number] Date of Birth: 10/06/1960 Referring Provider:   Flowsheet Row Cardiac Rehab from 09/19/2021 in [facility] Cardiac and Pulmonary Rehab  Referring Provider Serafina Royals MD       Encounter Date: 10/14/2021  Check In:  Session Check In - 10/14/21 0748       Check-In   Supervising physician immediately available to respond to emergencies See telemetry face sheet for immediately available ER MD    Location [facility]-Cardiac & Pulmonary Rehab    Staff Present Heath Lark, RN, BSN, CCRP;Melissa [place], RDN, LDN;Jessica [place], MA, RCEP, CCRP, CCET    Virtual Visit No    Medication changes reported     No    Fall or balance concerns reported    No    Warm-up and Cool-down Performed on first and last piece of equipment    Resistance Training Performed Yes    VAD Patient? No    PAD/SET Patient? No      Pain Assessment   Currently in Pain? No/denies                Social History   Tobacco Use  Smoking Status Never  Smokeless Tobacco Never    Goals Met:  Independence with exercise equipment Exercise tolerated well No report of concerns or symptoms today  Goals Unmet:  Not Applicable  Comments: Pt able to follow exercise prescription today without complaint.  Will continue to monitor for progression.    Dr. Emily Filbert is Medical Director for [place].  Dr. Ottie Glazier is Medical Director for [facility] Pulmonary Rehabilitation.

## 2021-10-17 ENCOUNTER — Other Ambulatory Visit: Payer: Self-pay | Admitting: Oncology

## 2021-10-17 ENCOUNTER — Encounter: Payer: Medicare Other | Admitting: *Deleted

## 2021-10-17 DIAGNOSIS — Z951 Presence of aortocoronary bypass graft: Secondary | ICD-10-CM

## 2021-10-17 DIAGNOSIS — C438 Malignant melanoma of overlapping sites of skin: Secondary | ICD-10-CM

## 2021-10-17 MED ORDER — LIDOCAINE-PRILOCAINE 2.5-2.5 % EX CREA: TOPICAL_CREAM | CUTANEOUS | 3 refills | Status: DC

## 2021-10-17 NOTE — Progress Notes (Signed)
ON PATHWAY REGIMEN - Melanoma and Other Skin Cancers  No Change  Continue With Treatment as Ordered.  Original Decision Date/Time: 04/23/2019 20:59     A cycle is every 14 days:     Nivolumab   **Always confirm dose/schedule in your pharmacy ordering system**  Patient Characteristics: Melanoma, Local Recurrence - Resected, BRAF V600  Wild Type / BRAF V600 Results Pending or Unknown Disease Classification: Melanoma Disease Subtype: Cutaneous Therapeutic Status: Local Recurrence - Resected BRAF V600 Mutation Status: BRAF V600 Wild Type (No Mutation) Intent of Therapy: Curative Intent, Discussed with Patient

## 2021-10-17 NOTE — Progress Notes (Signed)
DISCONTINUE ON PATHWAY REGIMEN - Melanoma and Other Skin Cancers     A cycle is every 14 days:     Nivolumab   **Always confirm dose/schedule in your pharmacy ordering system**  REASON: Disease Progression PRIOR TREATMENT: [id number]: Nivolumab 240 mg q14 Days for up to 1 Year of Therapy TREATMENT RESPONSE: Progressive Disease (PD)  START ON PATHWAY REGIMEN - Melanoma and Other Skin Cancers     Cycles 1 through 4: A cycle is every 21 days:     Nivolumab      Ipilimumab    Cycles 5 and beyond: A cycle is every 14 days:     Nivolumab   **Always confirm dose/schedule in your pharmacy ordering system**  Patient Characteristics: Melanoma, Cutaneous/Unknown Primary, Distant Metastases, Unresectable, No Brain Metastases, First Line, BRAF V600 Wild Type / BRAF V600 Results Pending or Unknown, Candidate for Immunotherapy Disease Classification: Melanoma Disease Subtype: Cutaneous BRAF V600 Mutation Status: BRAF V600 Wild Type (No Mutation) Therapeutic Status: Distant Metastases Line of Therapy: First Line Immunotherapy Candidate Status: Candidate for Immunotherapy Intent of Therapy: Non-Curative / Palliative Intent, Not Discussed with Patient

## 2021-10-17 NOTE — Progress Notes (Signed)
DISCONTINUE ON PATHWAY REGIMEN - Melanoma and Other Skin Cancers     Cycles 1 through 4: A cycle is every 21 days:     Nivolumab      Ipilimumab    Cycles 5 and beyond: A cycle is every 14 days:     Nivolumab   **Always confirm dose/schedule in your pharmacy ordering system**  REASON: Disease Progression PRIOR TREATMENT: [id number]: Nivolumab 1 mg/kg + Ipilimumab 3 mg/kg q21 Days x 4 Doses Followed by Nivolumab 240 mg q14 Days Until Progression, Unacceptable Toxicity, or for up to 2 Years Based on Response TREATMENT RESPONSE: Progressive Disease (PD)  START ON PATHWAY REGIMEN - Melanoma and Other Skin Cancers     Cycles 1 through 4: A cycle is every 21 days:     Nivolumab      Ipilimumab    Cycles 5 and beyond: A cycle is every 14 days:     Nivolumab   **Always confirm dose/schedule in your pharmacy ordering system**  Patient Characteristics: Melanoma, Cutaneous/Unknown Primary, Distant Metastases, Unresectable, No Brain Metastases, First Line, BRAF V600 Wild Type / BRAF V600 Results Pending or Unknown, Candidate for Immunotherapy Disease Classification: Melanoma Disease Subtype: Cutaneous BRAF V600 Mutation Status: BRAF V600 Wild Type (No Mutation) Therapeutic Status: Distant Metastases Line of Therapy: First Line Immunotherapy Candidate Status: Candidate for Immunotherapy Intent of Therapy: Non-Curative / Palliative Intent, Discussed with Patient

## 2021-10-17 NOTE — Progress Notes (Signed)
Daily Session Note  Patient Details  Name: Edward Pitts MRN: [id number] Date of Birth: 11/26/1960 Referring Provider:   Flowsheet Row Cardiac Rehab from 09/19/2021 in [facility] Cardiac and Pulmonary Rehab  Referring Provider Serafina Royals MD       Encounter Date: 10/17/2021  Check In:  Session Check In - 10/17/21 0834       Check-In   Supervising physician immediately available to respond to emergencies See telemetry face sheet for immediately available ER MD    Location [facility]-Cardiac & Pulmonary Rehab    Staff Present Heath Lark, RN, BSN, CCRP;Jessica [place], MA, RCEP, CCRP, [place], BS, ACSM CEP, Exercise Physiologist    Virtual Visit No    Medication changes reported     Yes    Comments stopped amlodipine    Fall or balance concerns reported    No    Warm-up and Cool-down Performed on first and last piece of equipment    Resistance Training Performed Yes    VAD Patient? No    PAD/SET Patient? No      Pain Assessment   Currently in Pain? No/denies                Social History   Tobacco Use  Smoking Status Never  Smokeless Tobacco Never    Goals Met:  Independence with exercise equipment Exercise tolerated well No report of concerns or symptoms today  Goals Unmet:  Not Applicable  Comments: Pt able to follow exercise prescription today without complaint.  Will continue to monitor for progression.    Dr. Emily Filbert is Medical Director for [place].  Dr. Ottie Glazier is Medical Director for [facility] Pulmonary Rehabilitation.

## 2021-10-18 ENCOUNTER — Other Ambulatory Visit: Payer: Self-pay

## 2021-10-19 ENCOUNTER — Encounter: Payer: Medicare Other | Admitting: *Deleted

## 2021-10-19 DIAGNOSIS — Z951 Presence of aortocoronary bypass graft: Secondary | ICD-10-CM | POA: Diagnosis not present

## 2021-10-19 NOTE — Progress Notes (Signed)
Daily Session Note  Patient Details  Name: Edward Pitts MRN: [id number] Date of Birth: 1960-08-27 Referring Provider:   Flowsheet Row Cardiac Rehab from 09/19/2021 in [facility] Cardiac and Pulmonary Rehab  Referring Provider Serafina Royals MD       Encounter Date: 10/19/2021  Check In:  Session Check In - 10/19/21 0843       Check-In   Supervising physician immediately available to respond to emergencies See telemetry face sheet for immediately available ER MD    Location [facility]-Cardiac & Pulmonary Rehab    Staff Present Heath Lark, RN, BSN, CCRP;Jessica [place], MA, RCEP, CCRP, [place], BS, ACSM CEP, Exercise Physiologist    Virtual Visit No    Medication changes reported     No    Fall or balance concerns reported    No    Warm-up and Cool-down Performed on first and last piece of equipment    Resistance Training Performed Yes    VAD Patient? No    PAD/SET Patient? No      Pain Assessment   Currently in Pain? No/denies               Exercise Prescription Changes - 10/19/21 0800       Home Exercise Plan   Plans to continue exercise at Home (comment)   walking, staff videos   Frequency Add 2 additional days to program exercise sessions.    Initial Home Exercises Provided 10/19/21             Social History   Tobacco Use  Smoking Status Never  Smokeless Tobacco Never    Goals Met:  Independence with exercise equipment Exercise tolerated well No report of concerns or symptoms today  Goals Unmet:  Not Applicable  Comments: Pt able to follow exercise prescription today without complaint.  Will continue to monitor for progression.    Dr. Emily Filbert is Medical Director for [place].  Dr. Ottie Glazier is Medical Director for [facility] Pulmonary Rehabilitation.

## 2021-10-21 ENCOUNTER — Other Ambulatory Visit: Payer: Self-pay

## 2021-10-21 ENCOUNTER — Inpatient Hospital Stay: Payer: Medicare Other

## 2021-10-21 ENCOUNTER — Encounter: Payer: Self-pay | Admitting: Oncology

## 2021-10-21 ENCOUNTER — Inpatient Hospital Stay (HOSPITAL_BASED_OUTPATIENT_CLINIC_OR_DEPARTMENT_OTHER): Payer: Medicare Other | Admitting: Oncology

## 2021-10-21 DIAGNOSIS — C438 Malignant melanoma of overlapping sites of skin: Secondary | ICD-10-CM

## 2021-10-21 DIAGNOSIS — R4189 Other symptoms and signs involving cognitive functions and awareness: Secondary | ICD-10-CM | POA: Diagnosis not present

## 2021-10-21 DIAGNOSIS — N1832 Chronic kidney disease, stage 3b: Secondary | ICD-10-CM | POA: Diagnosis not present

## 2021-10-21 DIAGNOSIS — D631 Anemia in chronic kidney disease: Secondary | ICD-10-CM

## 2021-10-21 DIAGNOSIS — Z7901 Long term (current) use of anticoagulants: Secondary | ICD-10-CM | POA: Diagnosis not present

## 2021-10-21 DIAGNOSIS — I4891 Unspecified atrial fibrillation: Secondary | ICD-10-CM | POA: Diagnosis not present

## 2021-10-21 DIAGNOSIS — N1831 Chronic kidney disease, stage 3a: Secondary | ICD-10-CM | POA: Diagnosis not present

## 2021-10-21 LAB — COMPREHENSIVE METABOLIC PANEL
ALT: 31 U/L (ref 0–44)
AST: 26 U/L (ref 15–41)
Albumin: 4.1 g/dL (ref 3.5–5.0)
Alkaline Phosphatase: 91 U/L (ref 38–126)
Anion gap: 6 (ref 5–15)
BUN: 29 mg/dL — ABNORMAL HIGH (ref 8–23)
CO2: 22 mmol/L (ref 22–32)
Calcium: 9.1 mg/dL (ref 8.9–10.3)
Chloride: 111 mmol/L (ref 98–111)
Creatinine, Ser: 1.5 mg/dL — ABNORMAL HIGH (ref 0.61–1.24)
GFR, Estimated: 52 mL/min — ABNORMAL LOW (ref >60)
Glucose, Bld: 149 mg/dL — ABNORMAL HIGH (ref 70–99)
Potassium: 3.9 mmol/L (ref 3.5–5.1)
Sodium: 139 mmol/L (ref 135–145)
Total Bilirubin: 0.7 mg/dL (ref 0.3–1.2)
Total Protein: 7.4 g/dL (ref 6.5–8.1)

## 2021-10-21 LAB — CBC WITH DIFFERENTIAL/PLATELET
Abs Immature Granulocytes: 0.05 10*3/uL (ref 0.00–0.07)
Basophils Absolute: 0 10*3/uL (ref 0.0–0.1)
Basophils Relative: 1 %
Eosinophils Absolute: 0.1 10*3/uL (ref 0.0–0.5)
Eosinophils Relative: 3 %
HCT: 32.9 % — ABNORMAL LOW (ref 39.0–52.0)
Hemoglobin: 10.7 g/dL — ABNORMAL LOW (ref 13.0–17.0)
Immature Granulocytes: 1 %
Lymphocytes Relative: 20 %
Lymphs Abs: 0.9 10*3/uL (ref 0.7–4.0)
MCH: 29.6 pg (ref 26.0–34.0)
MCHC: 32.5 g/dL (ref 30.0–36.0)
MCV: 91.1 fL (ref 80.0–100.0)
Monocytes Absolute: 0.4 10*3/uL (ref 0.1–1.0)
Monocytes Relative: 9 %
Neutro Abs: 3.1 10*3/uL (ref 1.7–7.7)
Neutrophils Relative %: 66 %
Platelets: 204 10*3/uL (ref 150–400)
RBC: 3.61 MIL/uL — ABNORMAL LOW (ref 4.22–5.81)
RDW: 14.3 % (ref 11.5–15.5)
WBC: 4.7 10*3/uL (ref 4.0–10.5)
nRBC: 0 % (ref 0.0–0.2)

## 2021-10-21 LAB — IRON AND TIBC
Iron: 58 ug/dL (ref 45–182)
Saturation Ratios: 17 % — ABNORMAL LOW (ref 17.9–39.5)
TIBC: 335 ug/dL (ref 250–450)
UIBC: 277 ug/dL

## 2021-10-21 LAB — FERRITIN: Ferritin: 67 ng/mL (ref 24–336)

## 2021-10-21 MED ORDER — SODIUM CHLORIDE 0.9 % IV SOLN
Freq: Once | INTRAVENOUS | Status: AC
  Filled 2021-10-21: qty 250

## 2021-10-21 MED ORDER — FERROUS SULFATE 325 (65 FE) MG PO TBEC: 325.0000 mg | DELAYED_RELEASE_TABLET | Freq: Two times a day (BID) | ORAL | 3 refills | Status: DC

## 2021-10-21 MED ORDER — HEPARIN SOD (PORK) LOCK FLUSH 100 UNIT/ML IV SOLN
500.0000 [IU] | Freq: Once | INTRAVENOUS | Status: AC | PRN
  Administered 2021-10-21: 500 [IU]
  Filled 2021-10-21: qty 5

## 2021-10-21 MED ORDER — SODIUM CHLORIDE 0.9 % IV SOLN
240.0000 mg | Freq: Once | INTRAVENOUS | Status: AC
  Administered 2021-10-21: 240 mg via INTRAVENOUS
  Filled 2021-10-21: qty 24

## 2021-10-21 MED ORDER — SODIUM CHLORIDE 0.9% FLUSH
10.0000 mL | INTRAVENOUS | Status: DC | PRN
  Administered 2021-10-21: 10 mL via INTRAVENOUS
  Filled 2021-10-21: qty 10

## 2021-10-21 NOTE — Assessment & Plan Note (Signed)
avoid nephrotoxins.  Encourage oral hydration.    creatinine fluctuates.

## 2021-10-21 NOTE — Progress Notes (Signed)
Hematology/Oncology Progress note [phone] [phone]      Patient Care Team: Mechele Claude, FNP as PCP - General (Family Medicine) [name], MD as Consulting Physician (Oncology) Mickeal Skinner, Acey Lav, MD as Consulting Physician (Oncology) [name], MD as Consulting Physician (Cardiology) Mechele Claude, FNP (Family Medicine)  ASSESSMENT & PLAN:   Cancer Staging  Malignant melanoma of overlapping sites [facility]) Staging form: Melanoma of the Skin, AJCC 8th Edition - Pathologic: Stage Unknown (rpTX, pN1b, cM0) - Signed by [name], MD on 07/27/2020 - Pathologic: No stage assigned - Unsigned   Malignant melanoma of overlapping sites [facility]) Left inguinal nodal recurrence of melanoma.  Status post resection and adjuvant radiation. S/p 2 years of Nivolumab maintenance.  Recent CT, enhanced US findings showed small liver lesion which is not amendable for biopsy.  Discussed with patient and shared decision was made to resume on Nivolumab and short term image follow up Labs are reviewed and discussed with patient. Proceed with Nivolumab today  Anemia in chronic kidney disease Stable hemoglobin.  Continue monitor.   Stage 3a chronic kidney disease (HCC) avoid nephrotoxins.  Encourage oral hydration.    creatinine fluctuates.  Episodes of altered cognition These episodes were considered to be likely secondary to seizure activities. Recommend patient continue follow-up with neurology. I recommend patient to avoid driving. Take Ativan 2 mg for seizure episodes. Repeat MRI brain    No orders of the defined types were placed in this encounter. Follow-up Lab MD 2 weeks Nivolumab All questions were answered. The patient knows to call the clinic with any problems, questions or concerns.  [name], MD, PhD [facility] Hematology Oncology 10/21/2021   CHIEF COMPLAINTS/REASON FOR VISIT:  Follow up for melanoma HISTORY OF PRESENTING ILLNESS:    Edward Pitts is a  61 y.o.  male presents for recurrent malignant melanoma.   Oncology History  Malignant melanoma of overlapping sites [facility])  04/16/2019 Cancer Staging   Staging form: Melanoma of the Skin, AJCC 8th Edition - Pathologic: Stage Unknown (rpTX, pN1b, cM0) - Signed by [name], MD on 07/27/2020 Stage prefix: Recurrence    04/23/2019 Initial Diagnosis   Malignant melanoma   -He has a history of left lower extremity melanoma in 2011, status post local excision -04/16/2019 patient underwent left groin mass resection  Resection pathology showed malignant melanoma, replacing a lymph node, with extracapsular extension, peripheral and deep margins involved.  Left inguinal contents, all 7 lymph nodes were negative for melanoma in the lymph nodes. Extranodal melanoma identified in lymphatic and interstitium between nodes -PDL1 80% TPS    07/07/2019 -  Radiation Therapy   status post adjuvant radiation.   07/23/2019 - 07/21/2021 Chemotherapy   Nivolumab q14d      06/29/2020 Imaging   CT chest abdomen pelvis showed stable postoperative appearance of the left groin.  No evidence of local recurrence.  No evidence of metastatic disease in the chest abdomen or pelvis.  Hepatic steatosis.  Stable subcentimeter fluid attenuation lesion of the lateral right lobe of the liver, likely benign cyst or hemangioma.  Coronary artery disease.  Aortic atherosclerosis   03/10/2021 Imaging   MRI brain without contrast showed no definitive evidence of intracranial metastatic disease.  Study is limited by absence of intravenous contrast.     04/26/2021 Imaging   CT chest abdomen pelvis without contrast showed stable post operative changes of left groin with no evidence of recurrent disease.  No evidence of metastatic disease in the chest  abdomen pelvis.  Aortic atherosclerosis   08/08/2021 - 08/16/2021 Hospital Admission    patient was hospitalized due to NSTEMI status post CABG x3.  He also had  pacemaker The echocardiogram showed left ventricular ejection fraction of 65 to 70%,   09/15/2021 Imaging   CT chest abdomen pelvis w contrast  IMPRESSION: 1. Subtle hypodense 9 mm lesion in the left lobe of the liver is new from prior imaging including previous contrasted CT dating back to December 10, 2019, with the lesion appearing to equilibrate with background liver on delayed imaging sequence but is incompletely evaluated on this imaging study and technically nonspecific possibly reflecting a benign perfusional variant and while its appearance is not typical for that of a melanoma metastasis, it is not excluded on this examination. Suggest more definitive characterization by hepatic protocol MRI with and without contrast. 2. Stable postoperative changes in the left groin without evidence of local recurrent disease.3. No evidence of metastatic disease in the chest or pelvis.4.  Aortic Atherosclerosis (ICD10-I70.0).      09/23/2021 Imaging   Contrast-enhanced liver ultrasound Mildly hypoenhancing 2.2 cm mass in the posterior aspect of the left lobe of the liver with washout characteristics concerning for non hepatocellular malignancy, concerning for melanotic metastasis given history. The lesion is in an unfavorable location for percutaneous biopsy. Consider PET-CT for further characterization   10/20/2021 Imaging   CT chest abdomen pelvis showed stable postoperative/radiation appearance of the left groin.  No evidence of local recurrence/metastatic disease within the chest abdomen/pelvis.  Fatty liver disease.  Diverticulosis without evidence of typhlitis.  Aortic atherosclerosis   10/21/2021 -  Chemotherapy   Patient is on Treatment Plan : MELANOMA Nivolumab + Ipilimumab (1/3) q21d / Nivolumab q14d      04/18/2021, patient establish care with neurology Dr. Mickeal Skinner for intermittent altered cognition.  he was recommended to start Vimpat.    INTERVAL HISTORY Edward Pitts is a 61 y.o. male who  has above history reviewed by me today presents for follow up visit for management of inguinal nodal recurrence of melanoma, new liver lesion.  He reports no new complaints.   :Review of Systems  Constitutional:  Negative for appetite change, chills, fatigue, fever and unexpected weight change.  HENT:   Negative for hearing loss and voice change.   Eyes:  Negative for eye problems and icterus.  Respiratory:  Negative for chest tightness, cough and shortness of breath.   Cardiovascular:  Positive for leg swelling. Negative for chest pain.  Gastrointestinal:  Negative for abdominal distention and abdominal pain.  Endocrine: Negative for hot flashes.  Genitourinary:  Negative for difficulty urinating, dysuria and frequency.   Musculoskeletal:        Status post left hip replacement  Skin:  Negative for itching and rash.       Skin hypo-pigmentation on upper extremities, no change  Neurological:  Negative for light-headedness and numbness.       Spells  Hematological:  Negative for adenopathy. Does not bruise/bleed easily.  Psychiatric/Behavioral:  Negative for confusion.     MEDICAL HISTORY:  Past Medical History:  Diagnosis Date   Anemia    iron treatments   Anxiety    Aortic atherosclerosis (HCC)    Arthritis    Cancer of groin ([place]) 2021   left groin, resected, radiation   Cataract    Complication of anesthesia    PONV   Coronary artery disease    Dizziness of unknown etiology    has led to seizures  and passing out.   Family history of adverse reaction to anesthesia    PONV mother   GERD (gastroesophageal reflux disease)    History of complete heart block    PPM placed   Hyperlipidemia    Hypertension    LBBB (left bundle branch block)    Lymphedema of left leg    uses thigh high compression stockings   Melanoma ([place]) 2012   skin cancer, left thigh   OSA on CPAP    PONV (postoperative nausea and vomiting) 04/16/2019   Port-A-Cath in place    RIGHT chest wall    Presence of cardiac pacemaker    [employer]   Seizures ([place])    still has episodes of dizziness. last event 1 month ago (march 2022) and will pass out. takes clonazepam    SURGICAL HISTORY: Past Surgical History:  Procedure Laterality Date   CORONARY ARTERY BYPASS GRAFT N/A 08/10/2021   Procedure: CORONARY ARTERY BYPASS GRAFTING (CABG) X 3 USING LEFT INTERNAL MAMMARY ARTERY AND RIGHT GREATER SAPHENOUS VEIN;  Surgeon: Dahlia Byes, MD;  Location: [place];  Service: Open Heart Surgery;  Laterality: N/A;   CT RADIATION THERAPY GUIDE     left groin   dental implant     permanent implant   ENDOVEIN HARVEST OF GREATER SAPHENOUS VEIN Right 08/10/2021   Procedure: ENDOVEIN HARVEST OF GREATER SAPHENOUS VEIN;  Surgeon: Dahlia Byes, MD;  Location: [place];  Service: Open Heart Surgery;  Laterality: Right;   KNEE SURGERY Left    arthroscopy   LEFT HEART CATH AND CORONARY ANGIOGRAPHY Left 06/29/2017   Procedure: LEFT HEART CATH AND CORONARY ANGIOGRAPHY;  Surgeon: [name], MD;  Location: [place] [facility];  Service: Cardiovascular;  Laterality: Left;   LEFT HEART CATH AND CORONARY ANGIOGRAPHY N/A 08/08/2021   Procedure: LEFT HEART CATH AND CORONARY ANGIOGRAPHY;  Surgeon: [name], MD;  Location: [place] [facility];  Service: Cardiovascular;  Laterality: N/A;   LYMPH NODE DISSECTION Left 04/16/2019   Procedure: Left inguinal Lymph Node Dissection;  Surgeon: [name], MD;  Location: [place];  Service: General;  Laterality: Left;   MELANOMA EXCISION Left 04/16/2019   Procedure: MELANOMA EXCISION LEFT GROIN MASS;  Surgeon: [name], MD;  Location: [place];  Service: General;  Laterality: Left;   MELANOMA EXCISION WITH SENTINEL LYMPH NODE BIOPSY Left 2012   Left calf    PACEMAKER INSERTION N/A 08/26/2018   Procedure: INSERTION PACEMAKER;  Surgeon: [name], MD;  Location: [facility];  Service: Cardiovascular;  Laterality: N/A;   PORTA CATH INSERTION N/A 08/26/2019    Procedure: PORTA CATH INSERTION;  Surgeon: Katha Cabal, MD;  Location: [place] [facility];  Service: Cardiovascular;  Laterality: N/A;   SUPERFICIAL LYMPH NODE BIOPSY / EXCISION Left 2020   lymph nodes removed around left groin melanoma site   TEE WITHOUT CARDIOVERSION N/A 08/10/2021   Procedure: TRANSESOPHAGEAL ECHOCARDIOGRAM (TEE);  Surgeon: Dahlia Byes, MD;  Location: [place];  Service: Open Heart Surgery;  Laterality: N/A;   TEMPORARY PACEMAKER N/A 08/25/2018   Procedure: TEMPORARY PACEMAKER;  Surgeon: [name], MD;  Location: [place] [facility];  Service: Cardiovascular;  Laterality: N/A;   TOTAL HIP ARTHROPLASTY Left 07/14/2020   Procedure: TOTAL HIP ARTHROPLASTY;  Surgeon: Dereck Leep, MD;  Location: [facility];  Service: Orthopedics;  Laterality: Left;    SOCIAL HISTORY: Social History   Socioeconomic History   Marital status: Married    Spouse name: Vicente Males  Number of children: 7   Years of education: 12   Highest education level: Not on file  Occupational History    Comment: disability  Tobacco Use   Smoking status: Never   Smokeless tobacco: Never  Vaping Use   Vaping Use: Never used  Substance and Sexual Activity   Alcohol use: No   Drug use: No   Sexual activity: Not Currently  Other Topics Concern   Not on file  Social History Narrative   Lives with  Wife,   Has 2 small dogs   Caffeine use: sodas (2 per day)      Out of work on disability.  Has a walk in shower. No stairs to climb   Oncology treatment ongoing. Uses port a cath for treatment.      pacemaker   Social Determinants of Health   Financial Resource Strain: Low Risk  (02/12/2019)   Overall Financial Resource Strain (CARDIA)    Difficulty of Paying Living Expenses: Not hard at all  Food Insecurity: No Food Insecurity (02/12/2019)   Hunger Vital Sign    Worried About Running Out of Food in the Last Year: Never true    Ran Out of Food in the Last Year: Never true   Transportation Needs: Unmet Transportation Needs (02/12/2019)   PRAPARE - Hydrologist (Medical): Yes    Lack of Transportation (Non-Medical): Yes  Physical Activity: Unknown (02/12/2019)   Exercise Vital Sign    Days of Exercise per Week: 0 days    Minutes of Exercise per Session: Not on file  Stress: No Stress Concern Present (02/12/2019)   [place]    Feeling of Stress : Only a little  Social Connections: Unknown (02/12/2019)   Social Connection and Isolation Panel [NHANES]    Frequency of Communication with Friends and Family: More than three times a week    Frequency of Social Gatherings with Friends and Family: Not on file    Attends Religious Services: Not on file    Active Member of Clubs or Organizations: Not on file    Attends Archivist Meetings: Not on file    Marital Status: Married  Intimate Partner Violence: Not At Risk (02/12/2019)   Humiliation, Afraid, Rape, and Kick questionnaire    Fear of Current or Ex-Partner: No    Emotionally Abused: No    Physically Abused: No    Sexually Abused: No    FAMILY HISTORY: Family History  Problem Relation Age of Onset   Cancer Paternal Grandmother     ALLERGIES:  is allergic to ibuprofen, levetiracetam, and nsaids.  MEDICATIONS:  Current Outpatient Medications  Medication Sig Dispense Refill   acetaminophen (TYLENOL) 500 MG tablet Take 1-2 tablets (500-1,000 mg total) by mouth every 6 (six) hours as needed. 30 tablet 0   allopurinol (ZYLOPRIM) 300 MG tablet Take 300 mg by mouth daily.     apixaban (ELIQUIS) 5 MG TABS tablet Take 1 tablet (5 mg total) by mouth 2 (two) times daily. 60 tablet 3   aspirin 81 MG EC tablet Take 81 mg by mouth daily.     atorvastatin (LIPITOR) 20 MG tablet Take 20 mg by mouth daily.     Carboxymeth-Glyc-Polysorb PF (REFRESH OPTIVE MEGA-3) 0.5-1-0.5 % SOLN Place 1 drop into both eyes daily  as needed (dry eyes).     hydrochlorothiazide (HYDRODIURIL) 25 MG tablet      hydrocortisone 2.5 % ointment Apply  1 application. topically daily as needed (itching).     lidocaine-prilocaine (EMLA) cream Apply 1 application. topically as needed. Apply small amount of cream to port site approx 1-2 hours prior to appointment. 30 g 11   lidocaine-prilocaine (EMLA) cream Apply to affected area once 30 g 3   lisinopril (ZESTRIL) 20 MG tablet Take 20 mg by mouth daily.     LORazepam (ATIVAN) 1 MG tablet Take 1 tablet (1 mg total) by mouth every 8 (eight) hours as needed for anxiety. 45 tablet 0   metoprolol succinate (TOPROL-XL) 25 MG 24 hr tablet Take 25 mg by mouth daily.     Multiple Vitamin (MULTIVITAMIN WITH MINERALS) TABS tablet Take 1 tablet by mouth daily. Centrum Silver     omeprazole (PRILOSEC) 20 MG capsule TAKE 1 CAPSULE BY MOUTH EVERY DAY 90 capsule 0   ondansetron (ZOFRAN) 4 MG tablet TAKE 1 TABLET BY MOUTH EVERY 8 HOURS AS NEEDED FOR NAUSEA AND VOMITING 90 tablet 1   VIMPAT 100 MG TABS TAKE 1 TABLET (100 MG TOTAL) BY MOUTH IN THE MORNING AND AT BEDTIME. 60 tablet 3   amiodarone (PACERONE) 200 MG tablet Take 2 tablets (400 mg total) by mouth 2 (two) times daily. X 5 days, then decrease to 200 mg BID (Patient not taking: Reported on 09/19/2021) 90 tablet 1   clonazePAM (KLONOPIN) 0.5 MG tablet 1 TABLET IN THE MORNING, 2 IN THE EVENING (Patient not taking: Reported on 09/19/2021) 90 tablet 0   HYDROcodone-acetaminophen (NORCO/VICODIN) 5-325 MG tablet  (Patient not taking: Reported on 09/19/2021)     LORazepam (ATIVAN) 2 MG tablet Take 1 tablet (2 mg total) by mouth every 8 (eight) hours as needed for seizure. (Patient not taking: Reported on 09/19/2021) 20 tablet 0   No current facility-administered medications for this visit.     PHYSICAL EXAMINATION: ECOG PERFORMANCE STATUS: 1 - Symptomatic but completely ambulatory Vitals:   10/21/21 0844  BP: 137/63  Pulse: 65  Temp: (!) 96.8 F (36  C)  SpO2: 99%   Filed Weights   10/21/21 0844  Weight: 237 lb 12.8 oz (107.9 kg)    Physical Exam Constitutional:      General: He is not in acute distress.    Comments: Patient ambulates independently  HENT:     Head: Normocephalic and atraumatic.  Eyes:     General: No scleral icterus.    Pupils: Pupils are equal, round, and reactive to light.  Cardiovascular:     Rate and Rhythm: Normal rate and regular rhythm.     Heart sounds: Normal heart sounds.  Pulmonary:     Effort: Pulmonary effort is normal. No respiratory distress.     Breath sounds: No wheezing.  Abdominal:     General: Bowel sounds are normal. There is no distension.     Palpations: Abdomen is soft. There is no mass.     Tenderness: There is no abdominal tenderness.     Comments:    Musculoskeletal:        General: No deformity. Normal range of motion.     Cervical back: Normal range of motion and neck supple.     Comments: Left lower extremity edema-chronic   Skin:    General: Skin is warm and dry.  Neurological:     Mental Status: He is alert and oriented to person, place, and time. Mental status is at baseline.     Cranial Nerves: No cranial nerve deficit.     Coordination: Coordination normal.  Psychiatric:        Mood and Affect: Mood normal.     LABORATORY DATA:  I have reviewed the data as listed    Latest Ref Rng & Units 10/21/2021    8:38 AM 09/15/2021    8:27 AM 08/15/2021    6:05 AM  CBC  WBC 4.0 - 10.5 K/uL 4.7  5.2  9.0   Hemoglobin 13.0 - 17.0 g/dL 10.7  9.9  10.5   Hematocrit 39.0 - 52.0 % 32.9  30.8  31.2   Platelets 150 - 400 K/uL 204  227  215       Latest Ref Rng & Units 10/21/2021    8:38 AM 09/15/2021    8:27 AM 08/16/2021    6:47 AM  CMP  Glucose 70 - 99 mg/dL 149  137  121   BUN 8 - 23 mg/dL 29  27  33   Creatinine 0.61 - 1.24 mg/dL 1.50  1.68  1.55   Sodium 135 - 145 mmol/L 139  140  138   Potassium 3.5 - 5.1 mmol/L 3.9  4.0  4.3   Chloride 98 - 111 mmol/L 111  108   105   CO2 22 - 32 mmol/L _0 Calcium 8.9 - 10.3 mg/dL 9.1  9.1  8.9   Total Protein 6.5 - 8.1 g/dL 7.4  7.5    Total Bilirubin 0.3 - 1.2 mg/dL 0.7  0.5    Alkaline Phos 38 - 126 U/L 91  105    AST 15 - 41 U/L 26  23    ALT 0 - 44 U/L 31  30       RADIOGRAPHIC STUDIES: I have personally reviewed the radiological images as listed and agreed with the findings in the report. NM PET Image Restage (PS) Whole Body  Result Date: 10/08/2021 CLINICAL DATA:  Subsequent treatment strategy for metastatic melanoma with left inguinal nodal recurrence. New liver lesion. EXAM: NUCLEAR MEDICINE PET WHOLE BODY TECHNIQUE: 13.2 mCi F-18 FDG was injected intravenously. Full-ring PET imaging was performed from the head to foot after the radiotracer. CT data was obtained and used for attenuation correction and anatomic localization. Fasting blood glucose: 106 mg/dl COMPARISON:  PET-CT 02/11/2019. CT of the chest, abdomen and pelvis 09/15/2021. FINDINGS: Mediastinal blood pool activity: SUV max 3.2 HEAD/ NECK: There is a small hypermetabolic nodule posteriorly in the superficial portion of the left parotid gland (SUV max 3.4). This appears similar to the previous PET-CT and is likely an incidental parotid lesion. No other hypermetabolic cervical lymph nodes are seen.There are no lesions of the pharyngeal mucosal space. Incidental CT findings: none CHEST: There are no hypermetabolic mediastinal, hilar or axillary lymph nodes. No hypermetabolic pulmonary activity or suspicious nodularity. Incidental CT findings: There is hypermetabolic activity within the median sternotomy, likely physiologic. The median sternotomy has incompletely healed, but demonstrates no dehiscence. Right IJ Port-A-Cath and left subclavian pacemaker leads are in place. ABDOMEN/PELVIS: There is focal hypermetabolic activity in the area of concern within the left hepatic lobe. The new lesion seen on CT is not well demonstrated on the CT images, but  there is focal hypermetabolic activity in this area (SUV max 4.5). No other hypermetabolic liver lesions are seen. There is no hypermetabolic activity within the adrenal glands, pancreas or spleen. There is no hypermetabolic nodal activity. Previously demonstrated large left nodal mass has been surgically removed, and no residual hypermetabolic activity is present in this area. Incidental CT  findings: Stable umbilical hernia containing only fat, colonic diverticulosis and aortic atherosclerosis. SKELETON: There is no hypermetabolic activity to suggest osseous metastatic disease. Incidental CT findings: none EXTREMITIES: There is low-level hypermetabolic activity within the subcutaneous fat medial to the left knee, corresponding with surgical clips and attributed to previous saphenous venous harvesting for CABG. Correlate clinically. No other suspicious soft tissue activity within the extremities. Incidental CT findings: none IMPRESSION: 1. The new lesion of concern in the left hepatic lobe is not optimally evaluated based on small size, but does demonstrate hypermetabolic activity, suspicious for metastatic disease. Assuming the patient is unable to undergo MRI due to his pacemaker, recommend attention on follow-up CT in 3 months. 2. No other evidence of metastatic disease. 3. 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 Electronically Signed   By: Richardean Sale M.D.   On: 10/08/2021 15:01   US LIVER W/CM 1ST LESION  Result Date: 09/23/2021 CLINICAL DATA:  61 year old male with history of melanoma with development of new left lobe lesion measuring up to 9 mm on recent staging CT. The patient presents for contrast enhanced ultrasound for further characterization. EXAM: US LIVER WITH CONTRAST CONTRAST:  2.4 mL sulfur hexafluoride Number of injections: 1 COMPARISON:  09/15/2021 FINDINGS: LIVER Size: Normal. Echogenicity: Within normal limits. Surface  Contour: Smooth. Observation 1 Lobe: Left Size: 2.2 cm Grayscale features: Hypoechoic with isoechoic center. Arterial phase enhancement features: Mildly hypoenhancing Onset of washout: 60 seconds Degree of washout: Marked Tumor in vein: No CEUS LI-RADS category: Not applicable in the absence of cirrhosis. IMPRESSION: Mildly hypoenhancing 2.2 cm mass in the posterior aspect of the left lobe of the liver with washout characteristics concerning for non hepatocellular malignancy, concerning for melanotic metastasis given history. The lesion is in an unfavorable location for percutaneous biopsy. Consider PET-CT for further characterization. Ruthann Cancer, MD [facility] Radiology Electronically Signed   By: Ruthann Cancer M.D.   On: 09/23/2021 15:39   CT CHEST ABDOMEN PELVIS W CONTRAST  Result Date: 09/15/2021 CLINICAL DATA:  History of melanoma, follow-up. * Tracking Code: BO * EXAM: CT CHEST, ABDOMEN, AND PELVIS WITH CONTRAST TECHNIQUE: Multidetector CT imaging of the chest, abdomen and pelvis was performed following the standard protocol during bolus administration of intravenous contrast. RADIATION DOSE REDUCTION: This exam was performed according to the departmental dose-optimization program which includes automated exposure control, adjustment of the mA and/or kV according to patient size and/or use of iterative reconstruction technique. CONTRAST:  86m OMNIPAQUE IOHEXOL 300 MG/ML  SOLN COMPARISON:  Multiple priors including most recent CT April 26, 2021 FINDINGS: CT CHEST FINDINGS Cardiovascular: Accessed right chest Port-A-Cath with tip near the superior cavoatrial junction. Left chest cardiac conduction device with leads in the right atrium and right ventricle. Interval median sternotomy and CABG. Aortic atherosclerosis without aneurysmal dilation. No central pulmonary embolus on this nondedicated study. Normal size heart. Trace pericardial effusion there are  postoperative. Mediastinum/Nodes: Mild stranding in the anterior mediastinum most likely reflect sequela of interval CABG. Hypodense 15 mm nodule in the right lobe of the thyroid on image 6/2. No pathologically enlarged mediastinal, hilar or axillary lymph nodes. Esophagus is grossly unremarkable. Lungs/Pleura: No suspicious pulmonary nodules or masses. No focal airspace consolidation. No pleural effusion. No pneumothorax. Musculoskeletal: No aggressive lytic or blastic lesion of bone. Median sternotomy wires are intact. Anterior chest wall incision. Bilateral gynecomastia. CT ABDOMEN PELVIS FINDINGS Hepatobiliary: Subtle 9 mm hypodense lesion in the left lobe  of the liver on image 55/2 is new from prior imaging including contrasted CTs dating back to December 10, 2019, with the lesion appearing to equilibrate to background liver on delayed imaging. Similar tiny low-density lesion in the right lobe of the liver are too small to accurately characterize but stable over multiple priors and favored to reflect a cyst. No solid enhancing hepatic lesion. Gallbladder is unremarkable. No biliary ductal dilation. Pancreas: No pancreatic ductal dilation or evidence of acute inflammation. Spleen: No splenomegaly or focal splenic lesion. Adrenals/Urinary Tract: Bilateral adrenal glands appear normal. No hydronephrosis. Hypodense 2 cm right renal cyst is considered benign requiring no independent imaging follow-up. Urinary bladder is unremarkable for degree of distension within limitation of streak artifact from left hip arthroplasty. Stomach/Bowel: Radiopaque enteric contrast material traverses the splenic flexure. Stomach is unremarkable for degree of distension. No pathologic dilation of small or large bowel. Left-sided colonic diverticulosis without findings of acute diverticulitis. No evidence of acute bowel inflammation. Vascular/Lymphatic: Aortic atherosclerosis without abdominal aortic aneurysm. No pathologically  enlarged abdominal or pelvic lymph nodes. Reproductive: Prostate is unremarkable. Other: Similar postsurgical changes in the left groin without new enhancing soft tissue in the surgical bed. Moderate-sized fat containing ventral hernia. Musculoskeletal: Prior left total hip arthroplasty. No aggressive lytic or blastic lesion of bone. IMPRESSION: 1. Subtle hypodense 9 mm lesion in the left lobe of the liver is new from prior imaging including previous contrasted CT dating back to December 10, 2019, with the lesion appearing to equilibrate with background liver on delayed imaging sequence but is incompletely evaluated on this imaging study and technically nonspecific possibly reflecting a benign perfusional variant and while its appearance is not typical for that of a melanoma metastasis, it is not excluded on this examination. Suggest more definitive characterization by hepatic protocol MRI with and without contrast. 2. Stable postoperative changes in the left groin without evidence of local recurrent disease. 3. No evidence of metastatic disease in the chest or pelvis. 4.  Aortic Atherosclerosis (ICD10-I70.0). Electronically Signed   By: Dahlia Bailiff M.D.   On: 09/15/2021 16:33   DG Chest 2 View  Result Date: 09/14/2021 CLINICAL DATA:  Status post coronary artery bypass grafts EXAM: CHEST - 2 VIEW COMPARISON:  08/14/2021 FINDINGS: Transverse diameter of heart is slightly increased. There is previous coronary artery bypass surgery. Pacemaker battery is seen in the left infraclavicular region with tips of leads in right atrium and right ventricle. Tip of right IJ chest port is seen in the superior vena cava. IMPRESSION: There are no new infiltrates or signs of pulmonary edema. Electronically Signed   By: Elmer Picker M.D.   On: 09/14/2021 13:49   DG Chest 2 View  Result Date: 08/14/2021 CLINICAL DATA:  Elevated myocardial infarction. EXAM: CHEST - 2 VIEW COMPARISON:  Aug 13, 2021 FINDINGS: Stable  right Port-A-Cath in good position. Stable pacemaker. The cardiomediastinal silhouette is stable. Sternotomy wires are intact. No pneumothorax. No nodules or masses. No focal infiltrates. No overt edema. IMPRESSION: No active cardiopulmonary disease. Electronically Signed   By: Dorise Bullion III M.D.   On: 08/14/2021 08:51   DG Chest Port 1 View  Result Date: 08/13/2021 CLINICAL DATA:  Status post coronary artery bypass surgery EXAM: PORTABLE CHEST 1 VIEW COMPARISON:  Previous studies including the examination of 08/12/2021 FINDINGS: Left chest tube is seen. There is no demonstrable pneumothorax. There is interval decrease in linear densities in the medial left lower lung fields. There are no signs of pulmonary edema  or new focal infiltrates. There is coronary bypass surgery. Tip of right IJ chest port is seen in the superior vena cava. Pacemaker battery is seen in the left infraclavicular region. There is interval removal of vascular sheath from right IJ. IMPRESSION: There is slight improvement in subsegmental atelectasis seen in the left lower lung fields. Other findings as described in the body of the report. Electronically Signed   By: Elmer Picker M.D.   On: 08/13/2021 09:43   DG Chest Port 1 View  Result Date: 08/12/2021 CLINICAL DATA:  History of CABG. EXAM: PORTABLE CHEST 1 VIEW COMPARISON:  Aug 11, 2021. FINDINGS: RIGHT IJ vascular sheath remains in place terminating in the region of the brachiocephalic junction. RIGHT-sided Port-A-Cath with tip in the area of the caval to atrial junction. LEFT sided chest tube in place, tip near the LEFT lung apex. Lower mediastinal drain tip projecting over the lower chest/upper abdomen on the radiograph. LEFT-sided dual lead pacer device remains in place. Post median sternotomy for CABG. No change in cardiomediastinal contours. Lung volumes are slightly increased following extubation. No visible pneumothorax. No dense consolidation with subtle basilar  opacities LEFT greater than RIGHT and with slight improvement since the prior study. On limited assessment no acute skeletal findings. IMPRESSION: 1. Status post extubation with improved lung volumes. 2. No pneumothorax. 3. Slight improvement in aeration of the lung bases with subtle basilar opacities LEFT greater than RIGHT, likely attributed to atelectasis. 4. Removal of Swan-Ganz catheter, vascular sheath remains in place via RIGHT IJ approach. Electronically Signed   By: Zetta Bills M.D.   On: 08/12/2021 07:56   DG Chest Port 1 View  Result Date: 08/11/2021 CLINICAL DATA:  Status post CABG yesterday. EXAM: PORTABLE CHEST 1 VIEW COMPARISON:  Chest x-ray from yesterday. FINDINGS: Unchanged endotracheal tube, right internal jugular Swan-Ganz catheter, right chest wall port catheter, mediastinal drain, and left chest tube. Unchanged left chest wall pacemaker. Stable cardiomediastinal silhouette status post CABG. Unchanged retrocardiac left lower lobe atelectasis. No focal consolidation, pleural effusion, or pneumothorax. IMPRESSION: 1. Stable support lines and tubes. 2. Unchanged left lower lobe atelectasis. No pneumothorax. Electronically Signed   By: Titus Dubin M.D.   On: 08/11/2021 07:52   DG Abd 1 View  Result Date: 08/10/2021 CLINICAL DATA:  Encounter for OG tube placement. EXAM: ABDOMEN - 1 VIEW COMPARISON:  Recent similar study today at 9:51 p.m. FINDINGS: 10:48 p.m., 08/10/2021. OG tube is in place interval advanced with tip now in the body of the stomach and appears adequately inserted. The bowel pattern is nonobstructive as far as visualized with the pelvic bowel not included in the exam. The lateral aspect of the right hemiabdomen was also cropped out. There is no supine evidence of free air. The visible visceral shadows are stable. No pathologic calcification is visible. IMPRESSION: OGT has been advanced with the tip now in the body of the stomach, grossly adequately inserted.  Electronically Signed   By: Telford Nab M.D.   On: 08/10/2021 23:26   DG Abd 1 View  Result Date: 08/10/2021 CLINICAL DATA:  Check gastric catheter placement EXAM: ABDOMEN - 1 VIEW COMPARISON:  None Available. FINDINGS: Gastric catheter is noted with the tip in the stomach. Proximal side port lies in the distal esophagus. This should be advanced several cm deeper into the stomach. IMPRESSION: Gastric catheter as described. This should be advanced deeper into the stomach. Electronically Signed   By: Inez Catalina M.D.   On: 08/10/2021 21:58  DG Chest Port 1 View  Result Date: 08/10/2021 CLINICAL DATA:  Intubation EXAM: PORTABLE CHEST 1 VIEW COMPARISON:  08/10/2021 at 3:21 p.m. FINDINGS: Single frontal view of the chest demonstrates endotracheal tube overlying tracheal air column tip just below thoracic inlet. Right chest wall port and flow directed right internal jugular central venous catheter unchanged. Left chest tube and mediastinal drain are again noted and stable. Epicardial pacing wires and dual lead pacemaker are unchanged. Postsurgical changes from CABG. Cardiac silhouette is unremarkable. Stable consolidation within the medial left lung base consistent with atelectasis. No effusion or pneumothorax. No acute bony abnormalities. IMPRESSION: 1. Support devices as above, with appropriate position of the endotracheal tube just below thoracic inlet. 2. Continued left lower lobe atelectasis. 3. No evidence of pneumothorax. Electronically Signed   By: Randa Ngo M.D.   On: 08/10/2021 17:14   DG Chest Port 1 View  Result Date: 08/10/2021 CLINICAL DATA:  Pneumothorax. EXAM: PORTABLE CHEST 1 VIEW COMPARISON:  Radiographs 08/10/2021 and 08/05/2021.  CT 04/26/2021. FINDINGS: 1521 hours. Interval median sternotomy and CABG. Tip of the endotracheal tube is just above the carina and could be pulled back 3 cm for more optimal positioning. Right IJ Swan-Ganz catheter extends into the main pulmonary artery.  A right IJ Port-A-Cath appears unchanged at the level of the upper right atrium. Left subclavian pacemaker leads appear unchanged. A left-sided chest tube is in place. There are low lung volumes with mild left lung atelectasis. No evidence of pneumothorax or significant pleural effusion. The heart size is stable. IMPRESSION: 1. Interval CABG without evidence of pneumothorax. 2. Support system positioned as above. Tip of the endotracheal tube is just above the carina and could be pulled back 3 cm for more optimal positioning. Electronically Signed   By: Richardean Sale M.D.   On: 08/10/2021 15:38   DG Chest 2 View  Result Date: 08/10/2021 CLINICAL DATA:  Scheduled for CABG May 17. Preoperative chest radiograph. EXAM: CHEST - 2 VIEW COMPARISON:  Portable chest 08/05/2021 FINDINGS: The heart size and mediastinal contours are within normal limits. Both lungs are clear. The visualized skeletal structures are unremarkable. A right IJ port catheter again terminates at the level of the cavoatrial junction. There is a left chest dual lead pacing system with stable wire insertions. IMPRESSION: No evidence of acute chest disease or interval changes. Electronically Signed   By: Telford Nab M.D.   On: 08/10/2021 00:33   VAS US DOPPLER PRE CABG  Result Date: 08/10/2021 PREOPERATIVE VASCULAR EVALUATION Patient Name:  RUAL VERMEER  Date of Exam:   08/09/2021 Medical Rec #: [id number]     Accession #:    [id number] Date of Birth: 1960/09/11     Patient Gender: M Patient Age:   32 years Exam Location:  [facility] Procedure:      VAS US DOPPLER PRE CABG Referring Phys: Collier Salina VANTRIGT --------------------------------------------------------------------------------  Indications:      Pre-CABG. Risk Factors:     Hypertension, hyperlipidemia, no history of smoking, prior MI,                   coronary artery disease. Comparison Study: No prior studies Performing Technologist: Darlin Coco RDMS, RVT  Examination  Guidelines: A complete evaluation includes B-mode imaging, spectral Doppler, color Doppler, and power Doppler as needed of all accessible portions of each vessel. Bilateral testing is considered an integral part of a complete examination. Limited examinations for reoccurring indications may be performed as noted.  Right Carotid  Findings: +----------+--------+--------+--------+--------+------------------+           PSV cm/sEDV cm/sStenosisDescribeComments           +----------+--------+--------+--------+--------+------------------+ CCA Prox  130     20                                         +----------+--------+--------+--------+--------+------------------+ CCA Distal108     25                      intimal thickening +----------+--------+--------+--------+--------+------------------+ ICA Prox  102     22      1-39%           intimal thickening +----------+--------+--------+--------+--------+------------------+ ICA Distal104     31                                         +----------+--------+--------+--------+--------+------------------+ ECA       99      15                                         +----------+--------+--------+--------+--------+------------------+ +----------+--------+-------+---------+------------+           PSV cm/sEDV cmsDescribe Arm Pressure +----------+--------+-------+---------+------------+ Subclavian198            Turbulent             +----------+--------+-------+---------+------------+ +---------+--------+--+--------+--+---------+ VertebralPSV cm/s43EDV cm/s13Antegrade +---------+--------+--+--------+--+---------+ Left Carotid Findings: +----------+--------+--------+--------+------------+--------+           PSV cm/sEDV cm/sStenosisDescribe    Comments +----------+--------+--------+--------+------------+--------+ CCA Prox  161     34                                    +----------+--------+--------+--------+------------+--------+ CCA Distal150     31                                   +----------+--------+--------+--------+------------+--------+ ICA Prox  98      29      1-39%   heterogenous         +----------+--------+--------+--------+------------+--------+ ICA Distal106     41                                   +----------+--------+--------+--------+------------+--------+ ECA       167     21                                   +----------+--------+--------+--------+------------+--------+ +----------+--------+--------+---------+------------+ SubclavianPSV cm/sEDV cm/sDescribe Arm Pressure +----------+--------+--------+---------+------------+           201             Turbulent             +----------+--------+--------+---------+------------+ +---------+--------+--+--------+-+---------+ VertebralPSV cm/s49EDV cm/s9Antegrade +---------+--------+--+--------+-+---------+  ABI Findings: +--------+------------------+-----+---------+--------+ Right   Rt Pressure (mmHg)IndexWaveform Comment  +--------+------------------+-----+---------+--------+ Brachial                       triphasic         +--------+------------------+-----+---------+--------+  PTA                            triphasic         +--------+------------------+-----+---------+--------+ DP                             triphasic         +--------+------------------+-----+---------+--------+ +--------+------------------+-----+---------+-------+ Left    Lt Pressure (mmHg)IndexWaveform Comment +--------+------------------+-----+---------+-------+ Brachial                       triphasic        +--------+------------------+-----+---------+-------+ PTA                            triphasic        +--------+------------------+-----+---------+-------+ DP                             triphasic         +--------+------------------+-----+---------+-------+  Right Doppler Findings: +--------+--------+-----+---------+--------+ Site    PressureIndexDoppler  Comments +--------+--------+-----+---------+--------+ Brachial             triphasic         +--------+--------+-----+---------+--------+ Radial               triphasic         +--------+--------+-----+---------+--------+ Ulnar                triphasic         +--------+--------+-----+---------+--------+  Left Doppler Findings: +--------+--------+-----+---------+--------+ Site    PressureIndexDoppler  Comments +--------+--------+-----+---------+--------+ Brachial             triphasic         +--------+--------+-----+---------+--------+ Radial               triphasic         +--------+--------+-----+---------+--------+ Ulnar                triphasic         +--------+--------+-----+---------+--------+  Summary: Right Carotid: Velocities in the right ICA are consistent with a 1-39% stenosis. Left Carotid: Velocities in the left ICA are consistent with a 1-39% stenosis. Vertebrals:  Bilateral vertebral arteries demonstrate antegrade flow. Subclavians: Bilateral subclavian artery flow was disturbed. Right Upper Extremity: Doppler waveforms remain within normal limits with right radial compression. Doppler waveforms remain within normal limits with right ulnar compression. Left Upper Extremity: Doppler waveforms remain within normal limits with left radial compression. Doppler waveforms remain within normal limits with left ulnar compression.  Electronically signed by Harold Barban MD on 08/10/2021 at 12:10:21 AM.    Final    ECHOCARDIOGRAM COMPLETE  Result Date: 08/08/2021    ECHOCARDIOGRAM REPORT   Patient Name:   CLEON SIGNORELLI Date of Exam: 08/08/2021 Medical Rec #:  [id number]    Height:       67.0 in Accession #:    [id number]   Weight:       250.0 lb Date of Birth:  12/23/1960    BSA:          2.223 m Patient Age:     66 years     BP:           138/66 mmHg Patient Gender: M            HR:  68 bpm. Exam Location:  [facility] Procedure: 2D Echo, Cardiac Doppler and Color Doppler Indications:     NSTEMI I21.4  History:         Patient has prior history of Echocardiogram examinations, most                  recent 08/26/2018. CAD, Pacemaker, Arrythmias:LBBB; Risk                  Factors:Hypertension.  Sonographer:     Sherrie Sport Referring Phys:  [id number] UKGURKYH AGBATA Diagnosing Phys: Yolonda Kida MD  Sonographer Comments: Suboptimal apical window and no subcostal window. IMPRESSIONS  1. Left ventricular ejection fraction, by estimation, is 65 to 70%. The left ventricle has normal function. The left ventricle has no regional wall motion abnormalities. Left ventricular diastolic parameters are consistent with Grade I diastolic dysfunction (impaired relaxation).  2. Right ventricular systolic function is normal. The right ventricular size is normal.  3. The mitral valve is normal in structure. Trivial mitral valve regurgitation.  4. The aortic valve is normal in structure. Aortic valve regurgitation is not visualized. FINDINGS  Left Ventricle: Left ventricular ejection fraction, by estimation, is 65 to 70%. The left ventricle has normal function. The left ventricle has no regional wall motion abnormalities. The left ventricular internal cavity size was normal in size. There is  no left ventricular hypertrophy. Left ventricular diastolic parameters are consistent with Grade I diastolic dysfunction (impaired relaxation). Right Ventricle: The right ventricular size is normal. No increase in right ventricular wall thickness. Right ventricular systolic function is normal. Left Atrium: Left atrial size was normal in size. Right Atrium: Right atrial size was normal in size. Pericardium: There is no evidence of pericardial effusion. Mitral Valve: The mitral valve is normal in structure. Trivial mitral valve regurgitation. MV peak  gradient, 3.9 mmHg. The mean mitral valve gradient is 2.0 mmHg. Tricuspid Valve: The tricuspid valve is normal in structure. Tricuspid valve regurgitation is trivial. Aortic Valve: The aortic valve is normal in structure. Aortic valve regurgitation is not visualized. Aortic valve mean gradient measures 5.0 mmHg. Aortic valve peak gradient measures 8.4 mmHg. Aortic valve area, by VTI measures 2.02 cm. Pulmonic Valve: The pulmonic valve was normal in structure. Pulmonic valve regurgitation is not visualized. Aorta: The ascending aorta was not well visualized. IAS/Shunts: No atrial level shunt detected by color flow Doppler.  LEFT VENTRICLE PLAX 2D LVIDd:         5.40 cm   Diastology LVIDs:         3.40 cm   LV e' medial:    7.94 cm/s LV PW:         1.30 cm   LV E/e' medial:  9.8 LV IVS:        0.80 cm   LV e' lateral:   5.87 cm/s LVOT diam:     2.00 cm   LV E/e' lateral: 13.3 LV SV:         64 LV SV Index:   29 LVOT Area:     3.14 cm  RIGHT VENTRICLE RV Basal diam:  3.90 cm RV S prime:     15.30 cm/s TAPSE (M-mode): 2.6 cm LEFT ATRIUM             Index        RIGHT ATRIUM           Index LA diam:        3.70 cm 1.66 cm/m  RA Area:     14.60 cm LA Vol (A2C):   46.8 ml 21.05 ml/m  RA Volume:   37.60 ml  16.91 ml/m LA Vol (A4C):   55.1 ml 24.78 ml/m LA Biplane Vol: 53.6 ml 24.11 ml/m  AORTIC VALVE                     PULMONIC VALVE AV Area (Vmax):    2.19 cm      PV Vmax:        1.16 m/s AV Area (Vmean):   1.96 cm      PV Vmean:       71.200 cm/s AV Area (VTI):     2.02 cm      PV VTI:         0.259 m AV Vmax:           145.00 cm/s   PV Peak grad:   5.4 mmHg AV Vmean:          103.450 cm/s  PV Mean grad:   2.0 mmHg AV VTI:            0.317 m       RVOT Peak grad: 9 mmHg AV Peak Grad:      8.4 mmHg AV Mean Grad:      5.0 mmHg LVOT Vmax:         101.00 cm/s LVOT Vmean:        64.700 cm/s LVOT VTI:          0.204 m LVOT/AV VTI ratio: 0.64  AORTA Ao Root diam: 3.10 cm MITRAL VALVE               TRICUSPID VALVE MV  Area (PHT): 3.31 cm    TR Peak grad:   17.5 mmHg MV Area VTI:   1.95 cm    TR Vmax:        209.00 cm/s MV Peak grad:  3.9 mmHg MV Mean grad:  2.0 mmHg    SHUNTS MV Vmax:       0.99 m/s    Systemic VTI:  0.20 m MV Vmean:      61.8 cm/s   Systemic Diam: 2.00 cm MV Decel Time: 229 msec    Pulmonic VTI:  0.337 m MV E velocity: 78.00 cm/s MV A velocity: 91.70 cm/s MV E/A ratio:  0.85 Yolonda Kida MD Electronically signed by Yolonda Kida MD Signature Date/Time: 08/08/2021/5:32:25 PM    Final    CARDIAC CATHETERIZATION  Result Date: 08/08/2021   Mid RCA lesion is 45% stenosed.   Mid LM lesion is 80% stenosed.   The left ventricular systolic function is normal.   LV end diastolic pressure is normal.   The left ventricular ejection fraction is greater than 65% by visual estimate. 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 Normal LV systolic function with mild septal hypokinesis due to pacemaker with ejection fraction of 50% Significant progression of distal left main coronary atherosclerosis to 75 to 80% and proximal right coronary artery to 40% Plan Proceed to coronary artery bypass grafting due to significant progression of left main coronary artery disease, non-ST elevation myocardial infarction, and abnormal stress test 3/23 High intensity cholesterol therapy Cardiac rehabilitation Further treatment options after above   DG Chest [facility] 1 View  Result Date: 08/05/2021 CLINICAL DATA:  Chest pain and dizziness after  mowing the yard. EXAM: PORTABLE CHEST 1 VIEW COMPARISON:  05/27/2021 FINDINGS: The right IJ power port is stable. The pacer wires are stable. The cardiac silhouette, mediastinal and hilar contours are within normal limits and unchanged. The lungs are clear. No pleural effusions. No pulmonary lesions. The bony thorax is  intact. IMPRESSION: No acute cardiopulmonary findings. Electronically Signed   By: Marijo Sanes M.D.   On: 08/05/2021 12:13

## 2021-10-21 NOTE — Addendum Note (Signed)
Addended by: [name] on: 10/21/2021 10:00 PM   Modules accepted: Orders

## 2021-10-21 NOTE — Assessment & Plan Note (Signed)
Left inguinal nodal recurrence of melanoma.  Status post resection and adjuvant radiation. S/p 2 years of Nivolumab maintenance.  Recent CT, enhanced US findings showed small liver lesion which is not amendable for biopsy.  Discussed with patient and shared decision was made to resume on Nivolumab and short term image follow up Labs are reviewed and discussed with patient. Proceed with Nivolumab today

## 2021-10-21 NOTE — Assessment & Plan Note (Addendum)
Iron panel showed iron deficiency.  Recommend ferrous sulfate '325mg'$  BID.  Continue monitor.

## 2021-10-21 NOTE — Assessment & Plan Note (Addendum)
These episodes were considered to be likely secondary to seizure activities. Recommend patient continue follow-up with neurology. I recommend patient to avoid driving. Take Ativan 2 mg for seizure episodes. Repeat MRI brain

## 2021-10-24 ENCOUNTER — Telehealth: Payer: Self-pay | Admitting: *Deleted

## 2021-10-24 ENCOUNTER — Encounter: Payer: Medicare Other | Admitting: *Deleted

## 2021-10-24 ENCOUNTER — Other Ambulatory Visit: Payer: Self-pay | Admitting: Internal Medicine

## 2021-10-24 DIAGNOSIS — Z951 Presence of aortocoronary bypass graft: Secondary | ICD-10-CM

## 2021-10-24 DIAGNOSIS — R569 Unspecified convulsions: Secondary | ICD-10-CM

## 2021-10-24 MED ORDER — CLONAZEPAM 0.5 MG PO TABS
ORAL_TABLET | ORAL | 0 refills | Status: DC
Start: 2021-10-24 — End: 2021-11-22

## 2021-10-24 MED ORDER — LORAZEPAM 2 MG PO TABS: 2.0000 mg | ORAL_TABLET | Freq: Three times a day (TID) | ORAL | 0 refills | Status: DC | PRN

## 2021-10-24 MED ORDER — LACOSAMIDE 100 MG PO TABS: ORAL_TABLET | ORAL | 3 refills | Status: DC

## 2021-10-24 NOTE — Telephone Encounter (Signed)
Patient called to request advice from Dr. Mickeal Skinner regarding continuing Vimpat, Lorazepam and Clonazepam. Patient needs refills if medications need to be continued.

## 2021-10-24 NOTE — Progress Notes (Signed)
Daily Session Note  Patient Details  Name: Edward Pitts MRN: [id number] Date of Birth: October 30, 1960 Referring Provider:   Flowsheet Row Cardiac Rehab from 09/19/2021 in [facility] Cardiac and Pulmonary Rehab  Referring Provider Serafina Royals MD       Encounter Date: 10/24/2021  Check In:  Session Check In - 10/24/21 0936       Check-In   Supervising physician immediately available to respond to emergencies See telemetry face sheet for immediately available ER MD    Location [facility]-Cardiac & Pulmonary Rehab    Staff Present Heath Lark, RN, BSN, Laveda Norman, BS, ACSM CEP, Exercise Physiologist;Noah Tickle, BS, Exercise Physiologist    Virtual Visit No    Medication changes reported     No    Fall or balance concerns reported    No    Warm-up and Cool-down Performed on first and last piece of equipment    Resistance Training Performed Yes    VAD Patient? No    PAD/SET Patient? No      Pain Assessment   Currently in Pain? No/denies                Social History   Tobacco Use  Smoking Status Never  Smokeless Tobacco Never    Goals Met:  Independence with exercise equipment Exercise tolerated well No report of concerns or symptoms today  Goals Unmet:  Not Applicable  Comments: Pt able to follow exercise prescription today without complaint.  Will continue to monitor for progression.    Dr. Emily Filbert is Medical Director for [place].  Dr. Ottie Glazier is Medical Director for [facility] Pulmonary Rehabilitation.

## 2021-10-25 ENCOUNTER — Other Ambulatory Visit: Payer: Self-pay

## 2021-10-26 ENCOUNTER — Encounter: Payer: Medicare Other | Attending: Internal Medicine | Admitting: *Deleted

## 2021-10-26 DIAGNOSIS — Z951 Presence of aortocoronary bypass graft: Secondary | ICD-10-CM | POA: Insufficient documentation

## 2021-10-26 NOTE — Progress Notes (Signed)
Daily Session Note  Patient Details  Name: Edward Pitts MRN: [id number] Date of Birth: 1961/01/26 Referring Provider:   Flowsheet Row Cardiac Rehab from 09/19/2021 in [facility] Cardiac and Pulmonary Rehab  Referring Provider Serafina Royals MD       Encounter Date: 10/26/2021  Check In:  Session Check In - 10/26/21 0756       Check-In   Supervising physician immediately available to respond to emergencies See telemetry face sheet for immediately available ER MD    Location [facility]-Cardiac & Pulmonary Rehab    Staff Present Heath Lark, RN, BSN, Jacklynn Bue, MS, ASCM CEP, Exercise Physiologist;Laureen Owens Shark, BS, RRT, CPFT    Virtual Visit No    Medication changes reported     No    Fall or balance concerns reported    No    Warm-up and Cool-down Performed on first and last piece of equipment    Resistance Training Performed Yes    VAD Patient? No    PAD/SET Patient? No      Pain Assessment   Currently in Pain? No/denies                Social History   Tobacco Use  Smoking Status Never  Smokeless Tobacco Never    Goals Met:  Independence with exercise equipment Exercise tolerated well No report of concerns or symptoms today  Goals Unmet:  Not Applicable  Comments: Pt able to follow exercise prescription today without complaint.  Will continue to monitor for progression.    Dr. Emily Filbert is Medical Director for [place].  Dr. Ottie Glazier is Medical Director for [facility] Pulmonary Rehabilitation.

## 2021-10-28 ENCOUNTER — Encounter: Payer: Medicare Other | Admitting: *Deleted

## 2021-10-28 DIAGNOSIS — Z951 Presence of aortocoronary bypass graft: Secondary | ICD-10-CM | POA: Diagnosis not present

## 2021-10-28 NOTE — Progress Notes (Signed)
Daily Session Note  Patient Details  Name: Edward Pitts MRN: [id number] Date of Birth: [birth year] Referring Provider:   Flowsheet Row Cardiac Rehab from 09/19/2021 in [facility] Cardiac and Pulmonary Rehab  Referring Provider Serafina Royals MD       Encounter Date: 10/28/2021  Check In:  Session Check In - 10/28/21 0747       Check-In   Supervising physician immediately available to respond to emergencies See telemetry face sheet for immediately available ER MD    Location [facility]-Cardiac & Pulmonary Rehab    Staff Present Heath Lark, RN, BSN, CCRP;Kristen Coble, RN,BC,MSN    Virtual Visit No    Medication changes reported     No    Fall or balance concerns reported    No    Warm-up and Cool-down Performed on first and last piece of equipment    Resistance Training Performed Yes    VAD Patient? No    PAD/SET Patient? No      Pain Assessment   Currently in Pain? No/denies                Social History   Tobacco Use  Smoking Status Never  Smokeless Tobacco Never    Goals Met:  Independence with exercise equipment Exercise tolerated well No report of concerns or symptoms today  Goals Unmet:  Not Applicable  Comments: Pt able to follow exercise prescription today without complaint.  Will continue to monitor for progression.    Dr. Emily Filbert is Medical Director for [place].  Dr. Ottie Glazier is Medical Director for [facility] Pulmonary Rehabilitation.

## 2021-10-31 ENCOUNTER — Ambulatory Visit: Payer: Self-pay | Admitting: Cardiothoracic Surgery

## 2021-10-31 ENCOUNTER — Encounter: Payer: Medicare Other | Admitting: *Deleted

## 2021-10-31 DIAGNOSIS — Z951 Presence of aortocoronary bypass graft: Secondary | ICD-10-CM

## 2021-10-31 NOTE — Progress Notes (Signed)
Daily Session Note  Patient Details  Name: Edward Pitts MRN: [id number] Date of Birth: 10-06-1960 Referring Provider:   Flowsheet Row Cardiac Rehab from 09/19/2021 in [facility] Cardiac and Pulmonary Rehab  Referring Provider Serafina Royals MD       Encounter Date: 10/31/2021  Check In:  Session Check In - 10/31/21 0812       Check-In   Supervising physician immediately available to respond to emergencies See telemetry face sheet for immediately available ER MD    Location [facility]-Cardiac & Pulmonary Rehab    Staff Present Heath Lark, RN, BSN, Jacklynn Bue, MS, ASCM CEP, Exercise Physiologist;Kelly Amedeo Plenty, BS, ACSM CEP, Exercise Physiologist    Virtual Visit No    Medication changes reported     No    Fall or balance concerns reported    No    Warm-up and Cool-down Performed on first and last piece of equipment    Resistance Training Performed Yes    VAD Patient? No    PAD/SET Patient? No      Pain Assessment   Currently in Pain? No/denies                Social History   Tobacco Use  Smoking Status Never  Smokeless Tobacco Never    Goals Met:  Independence with exercise equipment Exercise tolerated well No report of concerns or symptoms today  Goals Unmet:  Not Applicable  Comments: Pt able to follow exercise prescription today without complaint.  Will continue to monitor for progression.    Dr. Emily Filbert is Medical Director for [place].  Dr. Ottie Glazier is Medical Director for [facility] Pulmonary Rehabilitation.

## 2021-11-02 ENCOUNTER — Encounter: Payer: Medicare Other | Admitting: *Deleted

## 2021-11-02 ENCOUNTER — Encounter: Payer: Self-pay | Admitting: *Deleted

## 2021-11-02 DIAGNOSIS — Z951 Presence of aortocoronary bypass graft: Secondary | ICD-10-CM | POA: Diagnosis not present

## 2021-11-02 NOTE — Progress Notes (Signed)
Daily Session Note  Patient Details  Name: Edward Pitts MRN: [id number] Date of Birth: [birth year] Referring Provider:   Flowsheet Row Cardiac Rehab from 09/19/2021 in [facility] Cardiac and Pulmonary Rehab  Referring Provider Serafina Royals MD       Encounter Date: 11/02/2021  Check In:  Session Check In - 11/02/21 1700       Check-In   Supervising physician immediately available to respond to emergencies See telemetry face sheet for immediately available ER MD    Location [facility]-Cardiac & Pulmonary Rehab    Staff Present Nyoka Cowden, RN, BSN, Fenton Foy, BS, Exercise Physiologist;Susanne Bice, RN, BSN, CCRP;Melissa [place], RDN, LDN;Jessica Labette, MA, RCEP, CCRP, [place], MS, ASCM CEP, Exercise Physiologist    Virtual Visit No    Medication changes reported     No    Fall or balance concerns reported    No    Tobacco Cessation No Change    Warm-up and Cool-down Performed on first and last piece of equipment    Resistance Training Performed Yes    VAD Patient? No    PAD/SET Patient? No      Pain Assessment   Currently in Pain? No/denies                Social History   Tobacco Use  Smoking Status Never  Smokeless Tobacco Never    Goals Met:  Independence with exercise equipment Exercise tolerated well No report of concerns or symptoms today  Goals Unmet:  Not Applicable  Comments: Pt able to follow exercise prescription today without complaint.  Will continue to monitor for progression.    Dr. Emily Filbert is Medical Director for [place].  Dr. Ottie Glazier is Medical Director for [facility] Pulmonary Rehabilitation.

## 2021-11-02 NOTE — Progress Notes (Signed)
Cardiac Individual Treatment Plan  Patient Details  Name: Edward Pitts MRN: [id number] Date of Birth: May 25, 1960 Referring Provider:   Flowsheet Row Cardiac Rehab from 09/19/2021 in [facility] Cardiac and Pulmonary Rehab  Referring Provider Serafina Royals MD       Initial Encounter Date:  Flowsheet Row Cardiac Rehab from 09/19/2021 in [facility] Cardiac and Pulmonary Rehab  Date 09/19/21       Visit Diagnosis: S/P CABG x 3  Patient's Home Medications on Admission:  Current Outpatient Medications:    acetaminophen (TYLENOL) 500 MG tablet, Take 1-2 tablets (500-1,000 mg total) by mouth every 6 (six) hours as needed., Disp: 30 tablet, Rfl: 0   allopurinol (ZYLOPRIM) 300 MG tablet, Take 300 mg by mouth daily., Disp: , Rfl:    amiodarone (PACERONE) 200 MG tablet, Take 2 tablets (400 mg total) by mouth 2 (two) times daily. X 5 days, then decrease to 200 mg BID (Patient not taking: Reported on 09/19/2021), Disp: 90 tablet, Rfl: 1   apixaban (ELIQUIS) 5 MG TABS tablet, Take 1 tablet (5 mg total) by mouth 2 (two) times daily., Disp: 60 tablet, Rfl: 3   aspirin 81 MG EC tablet, Take 81 mg by mouth daily., Disp: , Rfl:    atorvastatin (LIPITOR) 20 MG tablet, Take 20 mg by mouth daily., Disp: , Rfl:    Carboxymeth-Glyc-Polysorb PF (REFRESH OPTIVE MEGA-3) 0.5-1-0.5 % SOLN, Place 1 drop into both eyes daily as needed (dry eyes)., Disp: , Rfl:    clonazePAM (KLONOPIN) 0.5 MG tablet, Take 0.$RemoveBefore'5mg'KeuKcuKwdVFJP$  in AM, $Remo'1mg'gInhs$  in PM, Disp: 90 tablet, Rfl: 0   ferrous sulfate 325 (65 FE) MG EC tablet, Take 1 tablet (325 mg total) by mouth 2 (two) times daily with a meal., Disp: 60 tablet, Rfl: 3   hydrochlorothiazide (HYDRODIURIL) 25 MG tablet, , Disp: , Rfl:    HYDROcodone-acetaminophen (NORCO/VICODIN) 5-325 MG tablet, , Disp: , Rfl:    hydrocortisone 2.5 % ointment, Apply 1 application. topically daily as needed (itching)., Disp: , Rfl:    Lacosamide (VIMPAT) 100 MG TABS, TAKE 1 TABLET (100 MG TOTAL) BY MOUTH IN THE MORNING AND AT  BEDTIME., Disp: 60 tablet, Rfl: 3   lidocaine-prilocaine (EMLA) cream, Apply 1 application. topically as needed. Apply small amount of cream to port site approx 1-2 hours prior to appointment., Disp: 30 g, Rfl: 11   lidocaine-prilocaine (EMLA) cream, Apply to affected area once, Disp: 30 g, Rfl: 3   lisinopril (ZESTRIL) 20 MG tablet, Take 20 mg by mouth daily., Disp: , Rfl:    LORazepam (ATIVAN) 1 MG tablet, Take 1 tablet (1 mg total) by mouth every 8 (eight) hours as needed for anxiety., Disp: 45 tablet, Rfl: 0   LORazepam (ATIVAN) 2 MG tablet, Take 1 tablet (2 mg total) by mouth every 8 (eight) hours as needed for seizure., Disp: 20 tablet, Rfl: 0   metoprolol succinate (TOPROL-XL) 25 MG 24 hr tablet, Take 25 mg by mouth daily., Disp: , Rfl:    Multiple Vitamin (MULTIVITAMIN WITH MINERALS) TABS tablet, Take 1 tablet by mouth daily. 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   Family history of adverse reaction to anesthesia    PONV mother   GERD (gastroesophageal reflux disease)    History of complete heart block    PPM placed   Hyperlipidemia    Hypertension    LBBB (left bundle branch block)    Lymphedema of left leg    uses thigh high compression stockings   Melanoma ([place]) 2012   skin cancer, left thigh   OSA on CPAP    PONV (postoperative nausea and vomiting) 04/16/2019   Port-A-Cath in place    RIGHT  chest wall   Presence of cardiac pacemaker    [employer]   Seizures ([place])    still has episodes of dizziness. last event 1 month ago (march 2022) and will pass out. takes clonazepam    Tobacco Use: Social History   Tobacco Use  Smoking Status Never  Smokeless Tobacco Never    Labs: Review Flowsheet  More data exists      Latest Ref Rng & Units 08/06/2021 08/09/2021 08/10/2021 08/11/2021 08/12/2021  Labs for ITP Cardiac and Pulmonary Rehab  Cholestrol 0 - 200 mg/dL 156  - - - -  LDL (calc) 0 - 99 mg/dL 91  - - - -  HDL-C >40 mg/dL 34  - - - -  Trlycerides <150 mg/dL 153  - - - -  Hemoglobin A1c 4.8 - 5.6 % - 5.7  - - -  PH, Arterial 7.35 - 7.45 - 7.39  7.340  7.331  7.292  7.363  7.370  7.370  7.385  7.341  7.370  -  PCO2 arterial 32 - 48 mmHg - 40  40.0  43.8  49.7  41.0  41.4  44.9  41.9  43.3  27.6  -  Bicarbonate 20.0 - 28.0 mmol/L - 24.2  21.6  23.6  24.0  23.3  23.9  26.0  25.3  25.1  23.4  16.0  -  TCO2 22 - 32 mmol/L - - $R'23  25  26  23  25  25  25  25  27  28  27  26  23  23  25  17  'Oe$ -  Acid-base deficit 0.0 - 2.0 mmol/L - 0.7  4.0  3.0  3.0  2.0  1.0  2.0  8.0  -  O2 Saturation % - 97.7  98  95  96  100  100  100  70  100  98  91  72.9  58.7      Exercise Target Goals: Exercise Program Goal: Individual exercise prescription set using results from initial 6 min walk test and THRR while considering  patient's activity barriers and safety.   Exercise Prescription Goal: Initial exercise prescription builds to 30-45 minutes a day of aerobic activity, 2-3 days per week.  Home exercise guidelines will be given to patient during program as part of exercise prescription that the participant will acknowledge.   Education: Aerobic Exercise: - Group verbal and visual presentation on the components of exercise prescription. Introduces F.I.T.T principle from ACSM for exercise prescriptions.  Reviews F.I.T.T. principles of aerobic exercise including progression. Written material given  at graduation. Flowsheet Row  Cardiac Rehab from 10/19/2021 in [facility] Cardiac and Pulmonary Rehab  Education need identified 09/19/21       Education: Resistance Exercise: - Group verbal and visual presentation on the components of exercise prescription. Introduces F.I.T.T principle from ACSM for exercise prescriptions  Reviews F.I.T.T. principles of resistance exercise including progression. Written material given at graduation.    Education: Exercise & Equipment Safety: - Individual verbal instruction and demonstration of equipment use and safety with use of the equipment. Flowsheet Row Cardiac Rehab from 10/19/2021 in [facility] Cardiac and Pulmonary Rehab  Date 09/14/21  Educator [facility]  Instruction Review Code 1- Verbalizes Understanding       Education: Exercise Physiology & General Exercise Guidelines: - Group verbal and written instruction with models to review the exercise physiology of the cardiovascular system and associated critical values. Provides general exercise guidelines with specific guidelines to those with heart or lung disease.  Flowsheet Row Cardiac Rehab from 10/19/2021 in [facility] Cardiac and Pulmonary Rehab  Date 09/21/21  Educator [facility]  Instruction Review Code 1- [employer] Understanding       Education: Flexibility, Balance, Mind/Body Relaxation: - Group verbal and visual presentation with interactive activity on the components of exercise prescription. Introduces F.I.T.T principle from ACSM for exercise prescriptions. Reviews F.I.T.T. principles of flexibility and balance exercise training including progression. Also discusses the mind body connection.  Reviews various relaxation techniques to help reduce and manage stress (i.e. Deep breathing, progressive muscle relaxation, and visualization). Balance handout provided to take home. Written material given at graduation. Flowsheet Row Cardiac Rehab from 10/12/2021 in [facility] Cardiac and Pulmonary Rehab  Date 10/12/21  Educator [facility]   Instruction Review Code 1- Verbalizes Understanding       Activity Barriers & Risk Stratification:  Activity Barriers & Cardiac Risk Stratification - 09/19/21 1145       Activity Barriers & Cardiac Risk Stratification   Activity Barriers Deconditioning;Muscular Weakness;Shortness of Breath;Balance Concerns;Other (comment)    Comments compression sock on left leg for swelling    Cardiac Risk Stratification High             6 Minute Walk:  6 Minute Walk     Row Name 09/19/21 1143         6 Minute Walk   Phase Initial     Distance 1042 feet     Walk Time 6 minutes     # of Rest Breaks 0     MPH 1.97     METS 2.43     RPE 9     Perceived Dyspnea  1     VO2 Peak 8.51     Symptoms Yes (comment)     Comments SOB     Resting HR 61 bpm     Resting BP 128/74     Resting Oxygen Saturation  95 %     Exercise Oxygen Saturation  during 6 min walk 96 %     Max Ex. HR 106 bpm     Max Ex. BP 126/74     2 Minute Post BP 124/62              Oxygen Initial Assessment:   Oxygen Re-Evaluation:   Oxygen Discharge (Final Oxygen Re-Evaluation):   Initial Exercise Prescription:  Initial Exercise Prescription - 09/19/21 1100       Date of Initial Exercise RX and Referring Provider   Date 09/19/21    Referring Provider Serafina Royals MD      Oxygen   Maintain  Oxygen Saturation 88% or higher      Treadmill   MPH 1.9    Grade 0    Minutes 15    METs 2.45      Recumbant Bike   Level 1    RPM 50    Watts 15    Minutes 15    METs 2.5      NuStep   Level 1    SPM 80    Minutes 15    METs 2.5      Track   Laps 26    Minutes 15    METs 2.41      Prescription Details   Frequency (times per week) 3    Duration Progress to 30 minutes of continuous aerobic without signs/symptoms of physical distress      Intensity   THRR 40-80% of Max Heartrate 100-139    Ratings of Perceived Exertion 11-13    Perceived Dyspnea 0-4      Progression   Progression  Continue to progress workloads to maintain intensity without signs/symptoms of physical distress.      Resistance Training   Training Prescription Yes    Weight 4 lb    Reps 10-15             Perform Capillary Blood Glucose checks as needed.  Exercise Prescription Changes:   Exercise Prescription Changes     Row Name 09/19/21 1100 10/03/21 0900 10/17/21 1000 10/19/21 0800 10/31/21 0800     Response to Exercise   Blood Pressure (Admit) 128/74 128/76 126/64 -- 124/68   Blood Pressure (Exercise) 126/74 160/94 126/70 -- --   Blood Pressure (Exit) 124/62 118/62 124/70 -- 108/56   Heart Rate (Admit) 61 bpm 56 bpm 69 bpm -- 63 bpm   Heart Rate (Exercise) 106 bpm 100 bpm 108 bpm -- 112 bpm   Heart Rate (Exit) 76 bpm 72 bpm 71 bpm -- 64 bpm   Oxygen Saturation (Admit) 95 % -- -- -- --   Oxygen Saturation (Exercise) 96 % -- -- -- --   Rating of Perceived Exertion (Exercise) 9 13 15  -- 15   Perceived Dyspnea (Exercise) 1 -- -- -- --   Symptoms SOB none none -- none   Comments walk test results -- -- -- --   Duration -- Continue with 30 min of aerobic exercise without signs/symptoms of physical distress. Continue with 30 min of aerobic exercise without signs/symptoms of physical distress. -- Continue with 30 min of aerobic exercise without signs/symptoms of physical distress.   Intensity -- THRR unchanged THRR unchanged -- THRR unchanged     Progression   Progression -- Continue to progress workloads to maintain intensity without signs/symptoms of physical distress. Continue to progress workloads to maintain intensity without signs/symptoms of physical distress. -- Continue to progress workloads to maintain intensity without signs/symptoms of physical distress.   Average METs -- 2.41 2.91 -- 3.34     Resistance Training   Training Prescription -- Yes Yes -- Yes   Weight -- 4 lb 4 lb -- 5 lb   Reps -- 10-15 10-15 -- 10-15     Interval Training   Interval Training -- No No -- No      Recumbant Bike   Level -- -- 3 -- 3   Watts -- -- 38 -- 38   Minutes -- -- 15 -- 15   METs -- -- 3.12 -- 3.11     NuStep   Level -- 6 6 --  7   Minutes -- 15 15 -- 15   METs -- 2.5 3.3 -- 4.1     REL-XR   Level -- 3 -- -- --   Minutes -- 15 -- -- --   METs -- 2.1 -- -- --     Track   Laps -- 30 30 -- 35   Minutes -- 15 15 -- 15   METs -- 2.63 2.63 -- 2.9     Home Exercise Plan   Plans to continue exercise at -- -- -- Home (comment)  walking, staff videos Home (comment)  walking, staff videos   Frequency -- -- -- Add 2 additional days to program exercise sessions. Add 2 additional days to program exercise sessions.   Initial Home Exercises Provided -- -- -- 10/19/21 10/19/21     Oxygen   Maintain Oxygen Saturation -- 88% or higher 88% or higher -- 88% or higher            Exercise Comments:   Exercise Comments     Row Name 09/21/21 0751           Exercise Comments First full day of exercise!  Patient was oriented to gym and equipment including functions, settings, policies, and procedures.  Patient's individual exercise prescription and treatment plan were reviewed.  All starting workloads were established based on the results of the 6 minute walk test done at initial orientation visit.  The plan for exercise progression was also introduced and progression will be customized based on patient's performance and goals.                Exercise Goals and Review:   Exercise Goals     Row Name 09/19/21 1158             Exercise Goals   Increase Physical Activity Yes       Intervention Provide advice, education, support and counseling about physical activity/exercise needs.;Develop an individualized exercise prescription for aerobic and resistive training based on initial evaluation findings, risk stratification, comorbidities and participant's personal goals.       Expected Outcomes Long Term: Exercising regularly at least 3-5 days a week.;Short Term: Attend  rehab on a regular basis to increase amount of physical activity.;Long Term: Add in home exercise to make exercise part of routine and to increase amount of physical activity.       Increase Strength and Stamina Yes       Intervention Provide advice, education, support and counseling about physical activity/exercise needs.;Develop an individualized exercise prescription for aerobic and resistive training based on initial evaluation findings, risk stratification, comorbidities and participant's personal goals.       Expected Outcomes Short Term: Increase workloads from initial exercise prescription for resistance, speed, and METs.;Long Term: Improve cardiorespiratory fitness, muscular endurance and strength as measured by increased METs and functional capacity (6MWT);Short Term: Perform resistance training exercises routinely during rehab and add in resistance training at home       Able to understand and use rate of perceived exertion (RPE) scale Yes       Intervention Provide education and explanation on how to use RPE scale       Expected Outcomes Short Term: Able to use RPE daily in rehab to express subjective intensity level;Long Term:  Able to use RPE to guide intensity level when exercising independently       Able to understand and use Dyspnea scale Yes       Intervention Provide education and  explanation on how to use Dyspnea scale       Expected Outcomes Short Term: Able to use Dyspnea scale daily in rehab to express subjective sense of shortness of breath during exertion;Long Term: Able to use Dyspnea scale to guide intensity level when exercising independently       Knowledge and understanding of Target Heart Rate Range (THRR) Yes       Intervention Provide education and explanation of THRR including how the numbers were predicted and where they are located for reference       Expected Outcomes Short Term: Able to state/look up THRR;Short Term: Able to use daily as guideline for intensity in  rehab;Long Term: Able to use THRR to govern intensity when exercising independently       Able to check pulse independently Yes       Intervention Provide education and demonstration on how to check pulse in carotid and radial arteries.;Review the importance of being able to check your own pulse for safety during independent exercise       Expected Outcomes Short Term: Able to explain why pulse checking is important during independent exercise;Long Term: Able to check pulse independently and accurately       Understanding of Exercise Prescription Yes       Intervention Provide education, explanation, and written materials on patient's individual exercise prescription       Expected Outcomes Short Term: Able to explain program exercise prescription;Long Term: Able to explain home exercise prescription to exercise independently                Exercise Goals Re-Evaluation :  Exercise Goals Re-Evaluation     Row Name 09/21/21 0752 10/03/21 0906 10/17/21 1102 10/19/21 0807 10/31/21 0902     Exercise Goal Re-Evaluation   Exercise Goals Review Able to understand and use rate of perceived exertion (RPE) scale;Knowledge and understanding of Target Heart Rate Range (THRR);Understanding of Exercise Prescription;Increase Physical Activity;Increase Strength and Stamina;Able to understand and use Dyspnea scale;Able to check pulse independently Increase Physical Activity;Increase Strength and Stamina;Understanding of Exercise Prescription Increase Physical Activity;Increase Strength and Stamina;Understanding of Exercise Prescription Increase Physical Activity;Increase Strength and Stamina;Understanding of Exercise Prescription Increase Physical Activity;Increase Strength and Stamina;Understanding of Exercise Prescription   Comments Reviewed RPE and dyspnea scales, THR and program prescription with pt today.  Pt voiced understanding and was given a copy of goals to take home. Gilverto is off to a good start in  rehab.  He has completed his first four full days of exercise thus far.  He is up to 30 laps on the track and level 6 on the NuStep. We will conitnue to montior his progress. Keisean continues to do well in rehab. He has worked up to 38 watts on the recumbent bike at level 3. He has exercised at 30 laps on the track and should be encouraged to increase beyond that. He continued to hit his THR. Will continue to monitor. Tore is doing well in rehab.  He is starting to feel like his stamina is starting to recover.  Reviewed home exercise with pt today.  Pt plans to walk and use staff videos at home for exercise.  Reviewed THR, pulse, RPE, sign and symptoms, pulse oximetery and when to call 911 or MD.  Also discussed weather considerations and indoor options.  Pt voiced understanding. Jibril is doing well in rehab. He recently improved to level 7 on the T4. He also improved to 35 laps on the track as  well. He has improved from 4 lb to 5 lb hand weights for resistance training as well. Andra also improved his overall average METs to 3.34 METs. We will continue to monitor his progress in the program.   Expected Outcomes Short: Use RPE daily to regulate intensity. Long: Follow program prescription in THR. Short: Conitnue to attend rehab reguarly Long: Continue to follow program prescription Short: Increase number of laps on track Long: Continue to increase overall MET level Short: Start to add in more walking at home  Long: Continue to improve stamina Short: Continue to push for more laps on the track. Long: Continue to improve strength and stamina.            Discharge Exercise Prescription (Final Exercise Prescription Changes):  Exercise Prescription Changes - 10/31/21 0800       Response to Exercise   Blood Pressure (Admit) 124/68    Blood Pressure (Exit) 108/56    Heart Rate (Admit) 63 bpm    Heart Rate (Exercise) 112 bpm    Heart Rate (Exit) 64 bpm    Rating of Perceived Exertion (Exercise) 15     Symptoms none    Duration Continue with 30 min of aerobic exercise without signs/symptoms of physical distress.    Intensity THRR unchanged      Progression   Progression Continue to progress workloads to maintain intensity without signs/symptoms of physical distress.    Average METs 3.34      Resistance Training   Training Prescription Yes    Weight 5 lb    Reps 10-15      Interval Training   Interval Training No      Recumbant Bike   Level 3    Watts 38    Minutes 15    METs 3.11      NuStep   Level 7    Minutes 15    METs 4.1      Track   Laps 35    Minutes 15    METs 2.9      Home Exercise Plan   Plans to continue exercise at Home (comment)   walking, staff videos   Frequency Add 2 additional days to program exercise sessions.    Initial Home Exercises Provided 10/19/21      Oxygen   Maintain Oxygen Saturation 88% or higher             Nutrition:  Target Goals: Understanding of nutrition guidelines, daily intake of sodium '1500mg'$ , cholesterol '200mg'$ , calories 30% from fat and 7% or less from saturated fats, daily to have 5 or more servings of fruits and vegetables.  Education: All About Nutrition: -Group instruction provided by verbal, written material, interactive activities, discussions, models, and posters to present general guidelines for heart healthy nutrition including fat, fiber, MyPlate, the role of sodium in heart healthy nutrition, utilization of the nutrition label, and utilization of this knowledge for meal planning. Follow up email sent as well. Written material given at graduation. Flowsheet Row Cardiac Rehab from 10/19/2021 in [facility] Cardiac and Pulmonary Rehab  Education need identified 09/19/21  Date 10/19/21  Educator [facility]  Instruction Review Code 1- Verbalizes Understanding       Biometrics:  Pre Biometrics - 09/19/21 1158       Pre Biometrics   Height 5' 8.5" (1.74 m)    Weight 238 lb 3.2 oz (108 kg)    BMI (Calculated) 35.69     Single Leg Stand 0.9 seconds  Nutrition Therapy Plan and Nutrition Goals:  Nutrition Therapy & Goals - 10/12/21 0803       Nutrition Therapy   RD appointment deferred Yes   He has been waiting on scheduling MD appointments to schedule, will check back in to schedule for August.     Personal Nutrition Goals   Nutrition Goal He has been waiting on scheduling MD appointments to schedule, will check back in to schedule for August.             Nutrition Assessments:  MEDIFICTS Score Key: ?70 Need to make dietary changes  40-70 Heart Healthy Diet ? 40 Therapeutic Level Cholesterol Diet  Flowsheet Row Cardiac Rehab from 09/19/2021 in [facility] Cardiac and Pulmonary Rehab  Picture Your Plate Total Score on Admission 64      Picture Your Plate Scores: <71 Unhealthy dietary pattern with much room for improvement. 41-50 Dietary pattern unlikely to meet recommendations for good health and room for improvement. 51-60 More healthful dietary pattern, with some room for improvement.  >60 Healthy dietary pattern, although there may be some specific behaviors that could be improved.    Nutrition Goals Re-Evaluation:  Nutrition Goals Re-Evaluation     [place] Name 10/19/21 0827             Goals   Nutrition Goal He has been waiting on scheduling MD appointments to schedule, will check back in to schedule for August.       Comment Pratt will be starting cancer treatments on Friday 7/28 and last set of MD appt are first of August, once he knows his schedule he will make appt with dietitian       Expected Outcome Meet with dietitian                Nutrition Goals Discharge (Final Nutrition Goals Re-Evaluation):  Nutrition Goals Re-Evaluation - 10/19/21 0827       Goals   Nutrition Goal He has been waiting on scheduling MD appointments to schedule, will check back in to schedule for August.    Comment Konner will be starting cancer treatments on Friday 7/28 and last  set of MD appt are first of August, once he knows his schedule he will make appt with dietitian    Expected Outcome Meet with dietitian             Psychosocial: Target Goals: Acknowledge presence or absence of significant depression and/or stress, maximize coping skills, provide positive support system. Participant is able to verbalize types and ability to use techniques and skills needed for reducing stress and depression.   Education: Stress, Anxiety, and Depression - Group verbal and visual presentation to define topics covered.  Reviews how body is impacted by stress, anxiety, and depression.  Also discusses healthy ways to reduce stress and to treat/manage anxiety and depression.  Written material given at graduation.   Education: Sleep Hygiene -Provides group verbal and written instruction about how sleep can affect your health.  Define sleep hygiene, discuss sleep cycles and impact of sleep habits. Review good sleep hygiene tips.    Initial Review & Psychosocial Screening:  Initial Psych Review & Screening - 09/14/21 0941       Initial Review   Current issues with None Identified      Family Dynamics   Good Support System? Yes    Comments He can look to his borther and sister for support. He feels ready to get his health back in order. Patients states depression or  anxiety.      Barriers   Psychosocial barriers to participate in program The patient should benefit from training in stress management and relaxation.;There are no identifiable barriers or psychosocial needs.      Screening Interventions   Interventions Encouraged to exercise;To provide support and resources with identified psychosocial needs;Provide feedback about the scores to participant    Expected Outcomes Short Term goal: Utilizing psychosocial counselor, staff and physician to assist with identification of specific Stressors or current issues interfering with healing process. Setting desired goal for each  stressor or current issue identified.;Long Term Goal: Stressors or current issues are controlled or eliminated.;Short Term goal: Identification and review with participant of any Quality of Life or Depression concerns found by scoring the questionnaire.;Long Term goal: The participant improves quality of Life and PHQ9 Scores as seen by post scores and/or verbalization of changes             Quality of Life Scores:   Quality of Life - 09/19/21 1159       Quality of Life   Select Quality of Life      Quality of Life Scores   Health/Function Pre 23.43 %    Socioeconomic Pre 27.33 %    Psych/Spiritual Pre 28.29 %    Family Pre 28.5 %    GLOBAL Pre 25.86 %            Scores of 19 and below usually indicate a poorer quality of life in these areas.  A difference of  2-3 points is a clinically meaningful difference.  A difference of 2-3 points in the total score of the Quality of Life Index has been associated with significant improvement in overall quality of life, self-image, physical symptoms, and general health in studies assessing change in quality of life.  PHQ-9: Review Flowsheet       09/19/2021  Depression screen PHQ 2/9  Decreased Interest 0  Down, Depressed, Hopeless 0  PHQ - 2 Score 0  Altered sleeping 2  Tired, decreased energy 2  Change in appetite 0  Feeling bad or failure about yourself  0  Trouble concentrating 0  Moving slowly or fidgety/restless 0  Suicidal thoughts 0  PHQ-9 Score 4  Difficult doing work/chores Not difficult at all   Interpretation of Total Score  Total Score Depression Severity:  1-4 = Minimal depression, 5-9 = Mild depression, 10-14 = Moderate depression, 15-19 = Moderately severe depression, 20-27 = Severe depression   Psychosocial Evaluation and Intervention:  Psychosocial Evaluation - 09/14/21 0945       Psychosocial Evaluation & Interventions   Interventions Encouraged to exercise with the program and follow exercise  prescription;Relaxation education;Stress management education    Comments He can look to his borther and sister for support. He feels ready to get his health back in order. Patients states depression or anxiety.    Expected Outcomes Short: Start [facility] to help with mood. Long: Maintain a healthy mental state    Continue Psychosocial Services  Follow up required by staff             Psychosocial Re-Evaluation:  Psychosocial Re-Evaluation     [place] Name 10/19/21 0825             Psychosocial Re-Evaluation   Current issues with Current Stress Concerns       Comments Derrill is doing well in rehab. He had a PET scan last week and they found another lesion on his liver but are not able  to biopsy it.  He is scheduled to start chemo infusion treatments again on Friday and will get an infusion every two weeks.  He will also have blood work done on Friday.  Last round of treatments, he did not have side effects and hopes that will be the case again this go around. Otherwise, he is feeling good mentally and just takes it one day at a time.       Expected Outcomes Short: Get through treatments Long: Continue to stay positive       Interventions Stress management education;Encouraged to attend Cardiac Rehabilitation for the exercise       Continue Psychosocial Services  Follow up required by staff                Psychosocial Discharge (Final Psychosocial Re-Evaluation):  Psychosocial Re-Evaluation - 10/19/21 0825       Psychosocial Re-Evaluation   Current issues with Current Stress Concerns    Comments Garry is doing well in rehab. He had a PET scan last week and they found another lesion on his liver but are not able to biopsy it.  He is scheduled to start chemo infusion treatments again on Friday and will get an infusion every two weeks.  He will also have blood work done on Friday.  Last round of treatments, he did not have side effects and hopes that will be the case again this go  around. Otherwise, he is feeling good mentally and just takes it one day at a time.    Expected Outcomes Short: Get through treatments Long: Continue to stay positive    Interventions Stress management education;Encouraged to attend Cardiac Rehabilitation for the exercise    Continue Psychosocial Services  Follow up required by staff             Vocational Rehabilitation: Provide vocational rehab assistance to qualifying candidates.   Vocational Rehab Evaluation & Intervention:   Education: Education Goals: Education classes will be provided on a variety of topics geared toward better understanding of heart health and risk factor modification. Participant will state understanding/return demonstration of topics presented as noted by education test scores.  Learning Barriers/Preferences:  Learning Barriers/Preferences - 09/14/21 0940       Learning Barriers/Preferences   Learning Barriers None    Learning Preferences None             General Cardiac Education Topics:  AED/CPR: - Group verbal and written instruction with the use of models to demonstrate the basic use of the AED with the basic ABC's of resuscitation.   Anatomy and Cardiac Procedures: - Group verbal and visual presentation and models provide information about basic cardiac anatomy and function. Reviews the testing methods done to diagnose heart disease and the outcomes of the test results. Describes the treatment choices: Medical Management, Angioplasty, or Coronary Bypass Surgery for treating various heart conditions including Myocardial Infarction, Angina, Valve Disease, and Cardiac Arrhythmias.  Written material given at graduation. Flowsheet Row Cardiac Rehab from 10/19/2021 in [facility] Cardiac and Pulmonary Rehab  Education need identified 09/19/21  Date 10/05/21  Educator SB  Instruction Review Code 1- Verbalizes Understanding       Medication Safety: - Group verbal and visual instruction to review  commonly prescribed medications for heart and lung disease. Reviews the medication, class of the drug, and side effects. Includes the steps to properly store meds and maintain the prescription regimen.  Written material given at graduation.   Intimacy: - Group verbal instruction through  game format to discuss how heart and lung disease can affect sexual intimacy. Written material given at graduation..   Know Your Numbers and Heart Failure: - Group verbal and visual instruction to discuss disease risk factors for cardiac and pulmonary disease and treatment options.  Reviews associated critical values for Overweight/Obesity, Hypertension, Cholesterol, and Diabetes.  Discusses basics of heart failure: signs/symptoms and treatments.  Introduces Heart Failure Zone chart for action plan for heart failure.  Written material given at graduation. Flowsheet Row Cardiac Rehab from 10/19/2021 in [facility] Cardiac and Pulmonary Rehab  Education need identified 09/19/21       Infection Prevention: - Provides verbal and written material to individual with discussion of infection control including proper hand washing and proper equipment cleaning during exercise session. Flowsheet Row Cardiac Rehab from 10/19/2021 in [facility] Cardiac and Pulmonary Rehab  Date 09/14/21  Educator [facility]  Instruction Review Code 1- Verbalizes Understanding       Falls Prevention: - Provides verbal and written material to individual with discussion of falls prevention and safety. Flowsheet Row Cardiac Rehab from 10/19/2021 in [facility] Cardiac and Pulmonary Rehab  Date 09/14/21  Educator [facility]  Instruction Review Code 1- Verbalizes Understanding       Other: -Provides group and verbal instruction on various topics (see comments)   Knowledge Questionnaire Score:  Knowledge Questionnaire Score - 09/19/21 1200       Knowledge Questionnaire Score   Pre Score 21/26             Core Components/Risk Factors/Patient Goals at  Admission:  Personal Goals and Risk Factors at Admission - 09/19/21 1200       Core Components/Risk Factors/Patient Goals on Admission    Weight Management Yes;Weight Loss;Obesity    Intervention Weight Management: Develop a combined nutrition and exercise program designed to reach desired caloric intake, while maintaining appropriate intake of nutrient and fiber, sodium and fats, and appropriate energy expenditure required for the weight goal.;Weight Management: Provide education and appropriate resources to help participant work on and attain dietary goals.;Weight Management/Obesity: Establish reasonable short term and long term weight goals.;Obesity: Provide education and appropriate resources to help participant work on and attain dietary goals.    Admit Weight 238 lb 3.2 oz (108 kg)    Goal Weight: Short Term 233 lb (105.7 kg)    Goal Weight: Long Term 220 lb (99.8 kg)    Expected Outcomes Short Term: Continue to assess and modify interventions until short term weight is achieved;Long Term: Adherence to nutrition and physical activity/exercise program aimed toward attainment of established weight goal;Weight Loss: Understanding of general recommendations for a balanced deficit meal plan, which promotes 1-2 lb weight loss per week and includes a negative energy balance of [phone] kcal/d;Understanding recommendations for meals to include 15-35% energy as protein, 25-35% energy from fat, 35-60% energy from carbohydrates, less than 200mg  of dietary cholesterol, 20-35 gm of total fiber daily;Understanding of distribution of calorie intake throughout the day with the consumption of 4-5 meals/snacks    Hypertension Yes    Intervention Provide education on lifestyle modifcations including regular physical activity/exercise, weight management, moderate sodium restriction and increased consumption of fresh fruit, vegetables, and low fat dairy, alcohol moderation, and smoking cessation.;Monitor prescription  use compliance.    Expected Outcomes Long Term: Maintenance of blood pressure at goal levels.;Short Term: Continued assessment and intervention until BP is < 140/34mm HG in hypertensive participants. < 130/55mm HG in hypertensive participants with diabetes, heart failure or chronic kidney disease.  Lipids Yes    Intervention Provide education and support for participant on nutrition & aerobic/resistive exercise along with prescribed medications to achieve LDL '70mg'$ , HDL >$Remo'40mg'hlUZZ$ .    Expected Outcomes Short Term: Participant states understanding of desired cholesterol values and is compliant with medications prescribed. Participant is following exercise prescription and nutrition guidelines.;Long Term: Cholesterol controlled with medications as prescribed, with individualized exercise RX and with personalized nutrition plan. Value goals: LDL < $Rem'70mg'JCcB$ , HDL > 40 mg.             Education:Diabetes - Individual verbal and written instruction to review signs/symptoms of diabetes, desired ranges of glucose level fasting, after meals and with exercise. Acknowledge that pre and post exercise glucose checks will be done for 3 sessions at entry of program.   Core Components/Risk Factors/Patient Goals Review:   Goals and Risk Factor Review     Row Name 10/19/21 1740             Core Components/Risk Factors/Patient Goals Review   Personal Goals Review Weight Management/Obesity;Hypertension;Lipids       Review Alisha is doing well in rehab.  He had lost weight after his cancer treatments before but has started to gain it back now.  He will also be starting treatments again on Friday.  He still wants to lose some weight.  His pressures are doing well in class.  He does not check them at home but was encouraged to do so.  He said that he needs to find his cuff and maybe either put new batteries in it or get a new one.  He is doing well with his medications overall.       Expected Outcomes Short: Find BP  cuff to use at home Long: Conitnue to monitor risk factors.                Core Components/Risk Factors/Patient Goals at Discharge (Final Review):   Goals and Risk Factor Review - 10/19/21 0828       Core Components/Risk Factors/Patient Goals Review   Personal Goals Review Weight Management/Obesity;Hypertension;Lipids    Review Jah is doing well in rehab.  He had lost weight after his cancer treatments before but has started to gain it back now.  He will also be starting treatments again on Friday.  He still wants to lose some weight.  His pressures are doing well in class.  He does not check them at home but was encouraged to do so.  He said that he needs to find his cuff and maybe either put new batteries in it or get a new one.  He is doing well with his medications overall.    Expected Outcomes Short: Find BP cuff to use at home Long: Conitnue to monitor risk factors.             ITP Comments:  ITP Comments     Row Name 09/14/21 0938 09/19/21 1143 09/21/21 0751 10/05/21 0740 11/02/21 8144   ITP Comments Virtual Visit completed. Patient informed on EP and RD appointment and 6 Minute walk test. Patient also informed of patient health questionnaires on My Chart. Patient Verbalizes understanding. Visit diagnosis can be found in [facility] 08/08/2021. Completed 6MWT and gym orientation. Initial ITP created and sent for review to Dr. Emily Filbert, Medical Director. First full day of exercise!  Patient was oriented to gym and equipment including functions, settings, policies, and procedures.  Patient's individual exercise prescription and treatment plan were reviewed.  All starting workloads were  established based on the results of the 6 minute walk test done at initial orientation visit.  The plan for exercise progression was also introduced and progression will be customized based on patient's performance and goals. 30 Day review completed. Medical Director ITP review done, changes made as  directed, and signed approval by Medical Director.   New 30 Day review completed. Medical Director ITP review done, changes made as directed, and signed approval by Medical Director.            Comments:

## 2021-11-03 DIAGNOSIS — Z79899 Other long term (current) drug therapy: Secondary | ICD-10-CM | POA: Diagnosis not present

## 2021-11-03 DIAGNOSIS — M1A00X0 Idiopathic chronic gout, unspecified site, without tophus (tophi): Secondary | ICD-10-CM | POA: Diagnosis not present

## 2021-11-04 ENCOUNTER — Ambulatory Visit: Payer: Medicare Other

## 2021-11-04 ENCOUNTER — Inpatient Hospital Stay: Payer: Medicare Other

## 2021-11-04 ENCOUNTER — Inpatient Hospital Stay (HOSPITAL_BASED_OUTPATIENT_CLINIC_OR_DEPARTMENT_OTHER): Payer: Medicare Other | Admitting: Oncology

## 2021-11-04 ENCOUNTER — Encounter: Payer: Medicare Other | Admitting: *Deleted

## 2021-11-04 ENCOUNTER — Other Ambulatory Visit: Payer: Medicare Other

## 2021-11-04 ENCOUNTER — Ambulatory Visit: Payer: Medicare Other | Admitting: Oncology

## 2021-11-04 ENCOUNTER — Inpatient Hospital Stay: Payer: Medicare Other | Attending: Oncology

## 2021-11-04 ENCOUNTER — Encounter: Payer: Self-pay | Admitting: Oncology

## 2021-11-04 VITALS — BP 115/61 | HR 75 | Temp 98.6°F | Resp 18 | Wt 236.8 lb

## 2021-11-04 DIAGNOSIS — D631 Anemia in chronic kidney disease: Secondary | ICD-10-CM | POA: Insufficient documentation

## 2021-11-04 DIAGNOSIS — E860 Dehydration: Secondary | ICD-10-CM

## 2021-11-04 DIAGNOSIS — C438 Malignant melanoma of overlapping sites of skin: Secondary | ICD-10-CM

## 2021-11-04 DIAGNOSIS — Z951 Presence of aortocoronary bypass graft: Secondary | ICD-10-CM

## 2021-11-04 DIAGNOSIS — Z5112 Encounter for antineoplastic immunotherapy: Secondary | ICD-10-CM

## 2021-11-04 DIAGNOSIS — N1831 Chronic kidney disease, stage 3a: Secondary | ICD-10-CM | POA: Diagnosis not present

## 2021-11-04 DIAGNOSIS — Z79899 Other long term (current) drug therapy: Secondary | ICD-10-CM | POA: Diagnosis not present

## 2021-11-04 DIAGNOSIS — N1832 Chronic kidney disease, stage 3b: Secondary | ICD-10-CM | POA: Diagnosis not present

## 2021-11-04 LAB — CBC WITH DIFFERENTIAL/PLATELET
Abs Immature Granulocytes: 0.06 10*3/uL (ref 0.00–0.07)
Basophils Absolute: 0 10*3/uL (ref 0.0–0.1)
Basophils Relative: 1 %
Eosinophils Absolute: 0.2 10*3/uL (ref 0.0–0.5)
Eosinophils Relative: 4 %
HCT: 31.5 % — ABNORMAL LOW (ref 39.0–52.0)
Hemoglobin: 10.4 g/dL — ABNORMAL LOW (ref 13.0–17.0)
Immature Granulocytes: 1 %
Lymphocytes Relative: 15 %
Lymphs Abs: 1 10*3/uL (ref 0.7–4.0)
MCH: 29.9 pg (ref 26.0–34.0)
MCHC: 33 g/dL (ref 30.0–36.0)
MCV: 90.5 fL (ref 80.0–100.0)
Monocytes Absolute: 0.6 10*3/uL (ref 0.1–1.0)
Monocytes Relative: 9 %
Neutro Abs: 4.4 10*3/uL (ref 1.7–7.7)
Neutrophils Relative %: 70 %
Platelets: 203 10*3/uL (ref 150–400)
RBC: 3.48 MIL/uL — ABNORMAL LOW (ref 4.22–5.81)
RDW: 14.5 % (ref 11.5–15.5)
WBC: 6.3 10*3/uL (ref 4.0–10.5)
nRBC: 0 % (ref 0.0–0.2)

## 2021-11-04 LAB — TSH: TSH: 3.309 u[IU]/mL (ref 0.350–4.500)

## 2021-11-04 LAB — COMPREHENSIVE METABOLIC PANEL
ALT: 24 U/L (ref 0–44)
AST: 21 U/L (ref 15–41)
Albumin: 4.2 g/dL (ref 3.5–5.0)
Alkaline Phosphatase: 89 U/L (ref 38–126)
Anion gap: 10 (ref 5–15)
BUN: 48 mg/dL — ABNORMAL HIGH (ref 8–23)
CO2: 20 mmol/L — ABNORMAL LOW (ref 22–32)
Calcium: 8.9 mg/dL (ref 8.9–10.3)
Chloride: 109 mmol/L (ref 98–111)
Creatinine, Ser: 2.07 mg/dL — ABNORMAL HIGH (ref 0.61–1.24)
GFR, Estimated: 36 mL/min — ABNORMAL LOW (ref >60)
Glucose, Bld: 125 mg/dL — ABNORMAL HIGH (ref 70–99)
Potassium: 4.1 mmol/L (ref 3.5–5.1)
Sodium: 139 mmol/L (ref 135–145)
Total Bilirubin: 0.2 mg/dL — ABNORMAL LOW (ref 0.3–1.2)
Total Protein: 7.3 g/dL (ref 6.5–8.1)

## 2021-11-04 LAB — T4, FREE: Free T4: 0.65 ng/dL (ref 0.61–1.12)

## 2021-11-04 MED ORDER — SODIUM CHLORIDE 0.9 % IV SOLN
Freq: Once | INTRAVENOUS | Status: AC
  Filled 2021-11-04: qty 250

## 2021-11-04 MED ORDER — SODIUM CHLORIDE 0.9% FLUSH
10.0000 mL | Freq: Once | INTRAVENOUS | Status: AC
  Administered 2021-11-04: 10 mL via INTRAVENOUS
  Filled 2021-11-04: qty 10

## 2021-11-04 MED ORDER — HEPARIN SOD (PORK) LOCK FLUSH 100 UNIT/ML IV SOLN
500.0000 [IU] | Freq: Once | INTRAVENOUS | Status: DC | PRN
  Filled 2021-11-04: qty 5

## 2021-11-04 MED ORDER — SODIUM CHLORIDE 0.9 % IV SOLN
240.0000 mg | Freq: Once | INTRAVENOUS | Status: AC
  Administered 2021-11-04: 240 mg via INTRAVENOUS
  Filled 2021-11-04: qty 24

## 2021-11-04 MED ORDER — HEPARIN SOD (PORK) LOCK FLUSH 100 UNIT/ML IV SOLN
500.0000 [IU] | Freq: Once | INTRAVENOUS | Status: AC
  Administered 2021-11-04: 500 [IU] via INTRAVENOUS
  Filled 2021-11-04: qty 5

## 2021-11-04 NOTE — Patient Instructions (Addendum)
[facility]  Discharge Instructions: Thank you for choosing [facility] to provide your oncology and hematology care.  If you have a lab appointment with the Cancer Center, please go directly to the Cancer Center and check in at the registration area.  Wear comfortable clothing and clothing appropriate for easy access to any Portacath or PICC line.   We strive to give you quality time with your provider. You may need to reschedule your appointment if you arrive late (15 or more minutes).  Arriving late affects you and other patients whose appointments are after yours.  Also, if you miss three or more appointments without notifying the office, you may be dismissed from the clinic at the provider's discretion.      For prescription refill requests, have your pharmacy contact our office and allow 72 hours for refills to be completed.    Today you received the following chemotherapy and/or immunotherapy agents opdivo     To help prevent nausea and vomiting after your treatment, we encourage you to take your nausea medication as directed.  BELOW ARE SYMPTOMS THAT SHOULD BE REPORTED IMMEDIATELY: *FEVER GREATER THAN 100.4 F (38 C) OR HIGHER *CHILLS OR SWEATING *NAUSEA AND VOMITING THAT IS NOT CONTROLLED WITH YOUR NAUSEA MEDICATION *UNUSUAL SHORTNESS OF BREATH *UNUSUAL BRUISING OR BLEEDING *URINARY PROBLEMS (pain or burning when urinating, or frequent urination) *BOWEL PROBLEMS (unusual diarrhea, constipation, pain near the anus) TENDERNESS IN MOUTH AND THROAT WITH OR WITHOUT PRESENCE OF ULCERS (sore throat, sores in mouth, or a toothache) UNUSUAL RASH, SWELLING OR PAIN  UNUSUAL VAGINAL DISCHARGE OR ITCHING   Items with * indicate a potential emergency and should be followed up as soon as possible or go to the Emergency Department if any problems should occur.  Please show the CHEMOTHERAPY ALERT CARD or IMMUNOTHERAPY ALERT CARD at check-in to the  Emergency Department and triage nurse.  Should you have questions after your visit or need to cancel or reschedule your appointment, please contact [facility]  [phone] and follow the prompts.  Office hours are 8:00 a.m. to 4:30 p.m. Monday - Friday. Please note that voicemails left after 4:00 p.m. may not be returned until the following business day.  We are closed weekends and major holidays. You have access to a nurse at all times for urgent questions. Please call the main number to the clinic [phone] and follow the prompts.  For any non-urgent questions, you may also contact your provider using MyChart. We now offer e-Visits for anyone 18 and older to request care online for non-urgent symptoms. For details visit [email or web address].   Also download the MyChart app! Go to the app store, search "MyChart", open the app, select [facility], and log in with your MyChart username and password.  Masks are optional in the cancer centers. If you would like for your care team to wear a mask while they are taking care of you, please let them know. For doctor visits, patients may have with them one support person who is at least 62 years old. At this time, visitors are not allowed in the infusion area.   

## 2021-11-04 NOTE — Progress Notes (Signed)
Daily Session Note  Patient Details  Name: Edward Pitts MRN: [id number] Date of Birth: Apr 13, 1960 Referring Provider:   Flowsheet Row Cardiac Rehab from 09/19/2021 in [facility] Cardiac and Pulmonary Rehab  Referring Provider Serafina Royals MD       Encounter Date: 11/04/2021  Check In:  Session Check In - 11/04/21 0749       Check-In   Supervising physician immediately available to respond to emergencies See telemetry face sheet for immediately available ER MD    Location [facility]-Cardiac & Pulmonary Rehab    Staff Present Nyoka Cowden, RN, BSN, [place] Field, BS, RRT, CPFT;Jessica Luan Pulling, MA, RCEP, CCRP, CCET    Virtual Visit No    Medication changes reported     Yes    Comments Pt staes "Stop Allopurinol $RemoveBeforeDEI'300mg'EiMKknducueHIjqi$ ; start Febuxostat $RemoveBeforeDE'40mg'mBAgYsgpUwmnaPR$ "    Fall or balance concerns reported    No    Tobacco Cessation No Change    Warm-up and Cool-down Performed on first and last piece of equipment    Resistance Training Performed Yes    VAD Patient? No    PAD/SET Patient? No      Pain Assessment   Currently in Pain? No/denies                Social History   Tobacco Use  Smoking Status Never  Smokeless Tobacco Never    Goals Met:  Independence with exercise equipment Exercise tolerated well No report of concerns or symptoms today  Goals Unmet:  Not Applicable  Comments: Pt able to follow exercise prescription today without complaint.  Will continue to monitor for progression.    Dr. Emily Filbert is Medical Director for [facility].  Dr. Ottie Glazier is Medical Director for [facility] Pulmonary Rehabilitation.

## 2021-11-04 NOTE — Progress Notes (Unsigned)
Patient here for follow up, no new concerns voiced today.

## 2021-11-05 ENCOUNTER — Encounter: Payer: Self-pay | Admitting: Oncology

## 2021-11-05 NOTE — Progress Notes (Signed)
Hematology/Oncology Progress note [phone] [phone]      Patient Care Team: Edward Claude, FNP as PCP - General (Family Medicine) Edward Server, MD as Consulting Physician (Oncology) Edward Pitts, Edward Lav, MD as Consulting Physician (Oncology) Edward Skains, MD as Consulting Physician (Cardiology) Edward Claude, FNP (Family Medicine)  ASSESSMENT & PLAN:   Cancer Staging  Malignant melanoma of overlapping sites [facility]) Staging form: Melanoma of the Skin, AJCC 8th Edition - Pathologic: Stage Unknown (rpTX, pN1b, cM0) - Signed by Edward Server, MD on 07/27/2020 - Pathologic: No stage assigned - Unsigned   Malignant melanoma of overlapping sites [facility]) Left inguinal nodal recurrence of melanoma.  Status post resection and adjuvant radiation. S/p 2 years of Nivolumab maintenance.  Recent CT, enhanced US findings showed small liver lesion which is not amendable for biopsy.  Discussed with patient and shared decision was made to resume on Nivolumab and short term image follow up Labs are reviewed and discussed with patient. Proceed with Nivolumab today   Anemia in chronic kidney disease Iron panel showed iron deficiency.  Recommend ferrous sulfate 347m BID.  Continue monitor.   Encounter for antineoplastic immunotherapy Immunotherapy plan as listed above   Orders Placed This Encounter  Procedures   CBC with Differential    Standing Status:   Future    Standing Expiration Date:   11/19/2022   Comprehensive metabolic panel    Standing Status:   Future    Standing Expiration Date:   11/19/2022   CBC with Differential    Standing Status:   Future    Standing Expiration Date:   12/03/2022   Comprehensive metabolic panel    Standing Status:   Future    Standing Expiration Date:   12/03/2022   CBC with Differential    Standing Status:   Future    Standing Expiration Date:   12/17/2022   Comprehensive metabolic panel    Standing Status:   Future    Standing  Expiration Date:   12/17/2022   T4    Standing Status:   Future    Standing Expiration Date:   12/17/2022   TSH    Standing Status:   Future    Standing Expiration Date:   12/17/2022   Follow-up Lab MD 2 weeks Nivolumab All questions were answered. The patient knows to call the clinic with any problems, questions or concerns.  Edward Server MD, PhD [facility] 11/04/2021   CHIEF COMPLAINTS/REASON FOR VISIT:  Follow up for melanoma HISTORY OF PRESENTING ILLNESS:   [name] a  61y.o.  male presents for recurrent malignant melanoma.   Oncology History  Malignant melanoma of overlapping sites ([facility]  04/16/2019 Cancer Staging   Staging form: Melanoma of the Skin, AJCC 8th Edition - Pathologic: Stage Unknown (rpTX, pN1b, cM0) - Signed by YEarlie Server MD on 07/27/2020 Stage prefix: Recurrence    04/23/2019 Initial Diagnosis   Malignant melanoma   -He has a history of left lower extremity melanoma in 2011, status post local excision -04/16/2019 patient underwent left groin mass resection  Resection pathology showed malignant melanoma, replacing a lymph node, with extracapsular extension, peripheral and deep margins involved.  Left inguinal contents, all 7 lymph nodes were negative for melanoma in the lymph nodes. Extranodal melanoma identified in lymphatic and interstitium between nodes -PDL1 80% TPS    07/07/2019 -  Radiation Therapy   status post adjuvant radiation.   07/23/2019 - 07/21/2021 Chemotherapy   Nivolumab q14d  06/29/2020 Imaging   CT chest abdomen pelvis showed stable postoperative appearance of the left groin.  No evidence of local recurrence.  No evidence of metastatic disease in the chest abdomen or pelvis.  Hepatic steatosis.  Stable subcentimeter fluid attenuation lesion of the lateral right lobe of the liver, likely benign cyst or hemangioma.  Coronary artery disease.  Aortic atherosclerosis   03/10/2021 Imaging   MRI brain without contrast  showed no definitive evidence of intracranial metastatic disease.  Study is limited by absence of intravenous contrast.     04/26/2021 Imaging   CT chest abdomen pelvis without contrast showed stable post operative changes of left groin with no evidence of recurrent disease.  No evidence of metastatic disease in the chest abdomen pelvis.  Aortic atherosclerosis   08/08/2021 - 08/16/2021 Hospital Admission    patient was hospitalized due to NSTEMI status post CABG x3.  He also had pacemaker The echocardiogram showed left ventricular ejection fraction of 65 to 70%,   09/15/2021 Imaging   CT chest abdomen pelvis w contrast  IMPRESSION: 1. Subtle hypodense 9 mm lesion in the left lobe of the liver is new from prior imaging including previous contrasted CT dating back to December 10, 2019, with the lesion appearing to equilibrate with background liver on delayed imaging sequence but is incompletely evaluated on this imaging study and technically nonspecific possibly reflecting a benign perfusional variant and while its appearance is not typical for that of a melanoma metastasis, it is not excluded on this examination. Suggest more definitive characterization by hepatic protocol MRI with and without contrast. 2. Stable postoperative changes in the left groin without evidence of local recurrent disease.3. No evidence of metastatic disease in the chest or pelvis.4.  Aortic Atherosclerosis (ICD10-I70.0).      09/23/2021 Imaging   Contrast-enhanced liver ultrasound Mildly hypoenhancing 2.2 cm mass in the posterior aspect of the left lobe of the liver with washout characteristics concerning for non hepatocellular malignancy, concerning for melanotic metastasis given history. The lesion is in an unfavorable location for percutaneous biopsy. Consider PET-CT for further characterization   10/20/2021 Imaging   CT chest abdomen pelvis showed stable postoperative/radiation appearance of the left groin.  No evidence  of local recurrence/metastatic disease within the chest abdomen/pelvis.  Fatty liver disease.  Diverticulosis without evidence of typhlitis.  Aortic atherosclerosis   10/21/2021 -  Chemotherapy   Patient is on Treatment Plan : MELANOMA Nivolumab (1) + Ipilimumab (3) q21d / Nivolumab (240) q14d     10/21/2021 - 11/04/2021 Chemotherapy   Patient is on Treatment Plan : MELANOMA Nivolumab + Ipilimumab (1/3) q21d / Nivolumab q14d     11/06/2021 - 11/06/2021 Chemotherapy   Patient is on Treatment Plan : MELANOMA Nivolumab q14d      04/18/2021, patient establish care with neurology Dr. Mickeal Pitts for intermittent altered cognition.  he was recommended to start Vimpat.    INTERVAL HISTORY Edward Pitts is a 61 y.o. male who has above history reviewed by me today presents for follow up visit for management of inguinal nodal recurrence of melanoma, new liver lesion.  He reports no new complaints. Denies diarrhea, skin rash, abdominal pain.Spells are less common after his bypass surgery   :Review of Systems  Constitutional:  Negative for appetite change, chills, fatigue, fever and unexpected weight change.  HENT:   Negative for hearing loss and voice change.   Eyes:  Negative for eye problems and icterus.  Respiratory:  Negative for chest tightness, cough and shortness of  breath.   Cardiovascular:  Positive for leg swelling. Negative for chest pain.  Gastrointestinal:  Negative for abdominal distention and abdominal pain.  Endocrine: Negative for hot flashes.  Genitourinary:  Negative for difficulty urinating, dysuria and frequency.   Musculoskeletal:        Status post left hip replacement  Skin:  Negative for itching and rash.       Skin hypo-pigmentation on upper extremities, no change  Neurological:  Negative for light-headedness and numbness.       Spells  Hematological:  Negative for adenopathy. Does not bruise/bleed easily.  Psychiatric/Behavioral:  Negative for confusion.     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   Family history of adverse reaction to anesthesia    PONV mother   GERD (gastroesophageal reflux disease)    History of complete heart block    PPM placed   Hyperlipidemia    Hypertension    LBBB (left bundle branch block)    Lymphedema of left leg    uses thigh high compression stockings   Melanoma ([place]) 2012   skin cancer, left thigh   OSA on CPAP    PONV (postoperative nausea and vomiting) 04/16/2019   Port-A-Cath in place    RIGHT chest wall   Presence of cardiac pacemaker    [employer]   Seizures ([place])    still has episodes of dizziness. last event 1 month ago (march 2022) and will pass out. takes clonazepam    SURGICAL HISTORY: Past Surgical History:  Procedure Laterality Date   CORONARY ARTERY BYPASS GRAFT N/A 08/10/2021   Procedure: CORONARY ARTERY BYPASS GRAFTING (CABG) X 3 USING LEFT INTERNAL MAMMARY ARTERY AND RIGHT GREATER SAPHENOUS VEIN;  Surgeon: Dahlia Byes, MD;  Location: [place];  Service: Open Heart Surgery;  Laterality: N/A;   CT RADIATION THERAPY GUIDE     left groin   dental implant     permanent implant   ENDOVEIN HARVEST OF GREATER SAPHENOUS VEIN Right 08/10/2021   Procedure: ENDOVEIN HARVEST OF GREATER SAPHENOUS VEIN;  Surgeon: Dahlia Byes, MD;  Location: [place];  Service: Open Heart Surgery;  Laterality: Right;   KNEE SURGERY Left    arthroscopy   LEFT HEART CATH AND CORONARY ANGIOGRAPHY Left 06/29/2017   Procedure: LEFT HEART CATH AND CORONARY ANGIOGRAPHY;  Surgeon: Edward Skains, MD;  Location: [place] [facility];  Service: Cardiovascular;  Laterality: Left;   LEFT HEART CATH AND CORONARY ANGIOGRAPHY  N/A 08/08/2021   Procedure: LEFT HEART CATH AND CORONARY ANGIOGRAPHY;  Surgeon: Edward Skains, MD;  Location: [place] [facility];  Service: Cardiovascular;  Laterality: N/A;   LYMPH NODE DISSECTION Left 04/16/2019   Procedure: Left inguinal Lymph Node Dissection;  Surgeon: [name], MD;  Location: [place];  Service: General;  Laterality: Left;   MELANOMA EXCISION Left 04/16/2019   Procedure: MELANOMA EXCISION LEFT GROIN MASS;  Surgeon: [name], MD;  Location: [place];  Service: General;  Laterality: Left;   MELANOMA EXCISION WITH SENTINEL LYMPH NODE BIOPSY Left 2012   Left calf  PACEMAKER INSERTION N/A 08/26/2018   Procedure: INSERTION PACEMAKER;  Surgeon: [name], MD;  Location: [facility];  Service: Cardiovascular;  Laterality: N/A;   PORTA CATH INSERTION N/A 08/26/2019   Procedure: PORTA CATH INSERTION;  Surgeon: Katha Cabal, MD;  Location: [place] [facility];  Service: Cardiovascular;  Laterality: N/A;   SUPERFICIAL LYMPH NODE BIOPSY / EXCISION Left 2020   lymph nodes removed around left groin melanoma site   TEE WITHOUT CARDIOVERSION N/A 08/10/2021   Procedure: TRANSESOPHAGEAL ECHOCARDIOGRAM (TEE);  Surgeon: Dahlia Byes, MD;  Location: [place];  Service: Open Heart Surgery;  Laterality: N/A;   TEMPORARY PACEMAKER N/A 08/25/2018   Procedure: TEMPORARY PACEMAKER;  Surgeon: [name], MD;  Location: [place] [facility];  Service: Cardiovascular;  Laterality: N/A;   TOTAL HIP ARTHROPLASTY Left 07/14/2020   Procedure: TOTAL HIP ARTHROPLASTY;  Surgeon: Dereck Leep, MD;  Location: [facility];  Service: Orthopedics;  Laterality: Left;    SOCIAL HISTORY: Social History   Socioeconomic History   Marital status: Married    Spouse name: Vicente Males    Number of children: 7   Years of education: 12   Highest education level: Not on file  Occupational History    Comment: disability  Tobacco Use   Smoking status: Never   Smokeless tobacco: Never  Vaping Use    Vaping Use: Never used  Substance and Sexual Activity   Alcohol use: No   Drug use: No   Sexual activity: Not Currently  Other Topics Concern   Not on file  Social History Narrative   Lives with  Wife,   Has 2 small dogs   Caffeine use: sodas (2 per day)      Out of work on disability.  Has a walk in shower. No stairs to climb   Oncology treatment ongoing. Uses port a cath for treatment.      pacemaker   Social Determinants of Health   Financial Resource Strain: Low Risk  (02/12/2019)   Overall Financial Resource Strain (CARDIA)    Difficulty of Paying Living Expenses: Not hard at all  Food Insecurity: No Food Insecurity (02/12/2019)   Hunger Vital Sign    Worried About Running Out of Food in the Last Year: Never true    Ran Out of Food in the Last Year: Never true  Transportation Needs: Unmet Transportation Needs (02/12/2019)   PRAPARE - Hydrologist (Medical): Yes    Lack of Transportation (Non-Medical): Yes  Physical Activity: Unknown (02/12/2019)   Exercise Vital Sign    Days of Exercise per Week: 0 days    Minutes of Exercise per Session: Not on file  Stress: No Stress Concern Present (02/12/2019)   [place]    Feeling of Stress : Only a little  Social Connections: Unknown (02/12/2019)   Social Connection and Isolation Panel [NHANES]    Frequency of Communication with Friends and Family: More than three times a week    Frequency of Social Gatherings with Friends and Family: Not on file    Attends Religious Services: Not on file    Active Member of Clubs or Organizations: Not on file    Attends Archivist Meetings: Not on file    Marital Status: Married  Intimate Partner Violence: Not At Risk (02/12/2019)   Humiliation, Afraid, Rape, and Kick questionnaire    Fear of Current or Ex-Partner: No    Emotionally Abused: No  Physically Abused: No    Sexually  Abused: No    FAMILY HISTORY: Family History  Problem Relation Age of Onset   Cancer Paternal Grandmother     ALLERGIES:  is allergic to ibuprofen, levetiracetam, and nsaids.  MEDICATIONS:  Current Outpatient Medications  Medication Sig Dispense Refill   acetaminophen (TYLENOL) 500 MG tablet Take 1-2 tablets (500-1,000 mg total) by mouth every 6 (six) hours as needed. 30 tablet 0   aspirin 81 MG EC tablet Take 81 mg by mouth daily.     atorvastatin (LIPITOR) 20 MG tablet Take 20 mg by mouth daily.     Carboxymeth-Glyc-Polysorb PF (REFRESH OPTIVE MEGA-3) 0.5-1-0.5 % SOLN Place 1 drop into both eyes daily as needed (dry eyes).     clonazePAM (KLONOPIN) 0.5 MG tablet Take 0.52m in AM, 17min PM 90 tablet 0   febuxostat (ULORIC) 40 MG tablet Take 40 mg by mouth daily.     ferrous sulfate 325 (65 FE) MG EC tablet Take 1 tablet (325 mg total) by mouth 2 (two) times daily with a meal. 60 tablet 3   hydrochlorothiazide (HYDRODIURIL) 25 MG tablet      hydrocortisone 2.5 % ointment Apply 1 application. topically daily as needed (itching).     Lacosamide (VIMPAT) 100 MG TABS TAKE 1 TABLET (100 MG TOTAL) BY MOUTH IN THE MORNING AND AT BEDTIME. 60 tablet 3   lidocaine-prilocaine (EMLA) cream Apply 1 application. topically as needed. Apply small amount of cream to port site approx 1-2 hours prior to appointment. 30 g 11   lisinopril (ZESTRIL) 20 MG tablet Take 20 mg by mouth daily.     LORazepam (ATIVAN) 2 MG tablet Take 1 tablet (2 mg total) by mouth every 8 (eight) hours as needed for seizure. 20 tablet 0   metoprolol succinate (TOPROL-XL) 25 MG 24 hr tablet Take 25 mg by mouth daily.     Multiple Vitamin (MULTIVITAMIN WITH MINERALS) TABS tablet Take 1 tablet by mouth daily. Centrum Silver     omeprazole (PRILOSEC) 20 MG capsule TAKE 1 CAPSULE BY MOUTH EVERY DAY 90 capsule 0   ondansetron (ZOFRAN) 4 MG tablet TAKE 1 TABLET BY MOUTH EVERY 8 HOURS AS NEEDED FOR NAUSEA AND VOMITING 90 tablet 1    HYDROcodone-acetaminophen (NORCO/VICODIN) 5-325 MG tablet  (Patient not taking: Reported on 09/19/2021)     No current facility-administered medications for this visit.     PHYSICAL EXAMINATION: ECOG PERFORMANCE STATUS: 1 - Symptomatic but completely ambulatory Vitals:   11/04/21 0921  BP: 115/61  Pulse: 75  Resp: 18  Temp: 98.6 F (37 C)   Filed Weights   11/04/21 0921  Weight: 236 lb 12.8 oz (107.4 kg)    Physical Exam Constitutional:      General: He is not in acute distress.    Comments: Patient ambulates independently  HENT:     Head: Normocephalic and atraumatic.  Eyes:     General: No scleral icterus.    Pupils: Pupils are equal, round, and reactive to light.  Cardiovascular:     Rate and Rhythm: Normal rate and regular rhythm.     Heart sounds: Normal heart sounds.  Pulmonary:     Effort: Pulmonary effort is normal. No respiratory distress.     Breath sounds: No wheezing.  Abdominal:     General: Bowel sounds are normal. There is no distension.     Palpations: Abdomen is soft. There is no mass.     Tenderness: There is no  abdominal tenderness.     Comments:    Musculoskeletal:        General: No deformity. Normal range of motion.     Cervical back: Normal range of motion and neck supple.     Comments: Left lower extremity edema-chronic   Skin:    General: Skin is warm and dry.  Neurological:     Mental Status: He is alert and oriented to person, place, and time. Mental status is at baseline.     Cranial Nerves: No cranial nerve deficit.     Coordination: Coordination normal.  Psychiatric:        Mood and Affect: Mood normal.     LABORATORY DATA:  I have reviewed the data as listed    Latest Ref Rng & Units 11/04/2021    9:06 AM 10/21/2021    8:38 AM 09/15/2021    8:27 AM  CBC  WBC 4.0 - 10.5 K/uL 6.3  4.7  5.2   Hemoglobin 13.0 - 17.0 g/dL 10.4  10.7  9.9   Hematocrit 39.0 - 52.0 % 31.5  32.9  30.8   Platelets 150 - 400 K/uL 203  204  227        Latest Ref Rng & Units 11/04/2021    9:06 AM 10/21/2021    8:38 AM 09/15/2021    8:27 AM  CMP  Glucose 70 - 99 mg/dL 125  149  137   BUN 8 - 23 mg/dL 48  29  27   Creatinine 0.61 - 1.24 mg/dL 2.07  1.50  1.68   Sodium 135 - 145 mmol/L 139  139  140   Potassium 3.5 - 5.1 mmol/L 4.1  3.9  4.0   Chloride 98 - 111 mmol/L 109  111  108   CO2 22 - 32 mmol/L _0 Calcium 8.9 - 10.3 mg/dL 8.9  9.1  9.1   Total Protein 6.5 - 8.1 g/dL 7.3  7.4  7.5   Total Bilirubin 0.3 - 1.2 mg/dL 0.2  0.7  0.5   Alkaline Phos 38 - 126 U/L 89  91  105   AST 15 - 41 U/L _1 ALT 0 - 44 U/L _2 RADIOGRAPHIC STUDIES: I have personally reviewed the radiological images as listed and agreed with the findings in the report. NM PET Image Restage (PS) Whole Body  Result Date: 10/08/2021 CLINICAL DATA:  Subsequent treatment strategy for metastatic melanoma with left inguinal nodal recurrence. New liver lesion. EXAM: NUCLEAR MEDICINE PET WHOLE BODY TECHNIQUE: 13.2 mCi F-18 FDG was injected intravenously. Full-ring PET imaging was performed from the head to foot after the radiotracer. CT data was obtained and used for attenuation correction and anatomic localization. Fasting blood glucose: 106 mg/dl COMPARISON:  PET-CT 02/11/2019. CT of the chest, abdomen and pelvis 09/15/2021. FINDINGS: Mediastinal blood pool activity: SUV max 3.2 HEAD/ NECK: There is a small hypermetabolic nodule posteriorly in the superficial portion of the left parotid gland (SUV max 3.4). This appears similar to the previous PET-CT and is likely an incidental parotid lesion. No other hypermetabolic cervical lymph nodes are seen.There are no lesions of the pharyngeal mucosal space. Incidental CT findings: none CHEST: There are no hypermetabolic mediastinal, hilar or axillary lymph nodes. No hypermetabolic pulmonary activity or suspicious nodularity. Incidental CT findings: There is hypermetabolic activity within the median  sternotomy, likely physiologic. The median sternotomy has incompletely  healed, but demonstrates no dehiscence. Right IJ Port-A-Cath and left subclavian pacemaker leads are in place. ABDOMEN/PELVIS: There is focal hypermetabolic activity in the area of concern within the left hepatic lobe. The new lesion seen on CT is not well demonstrated on the CT images, but there is focal hypermetabolic activity in this area (SUV max 4.5). No other hypermetabolic liver lesions are seen. There is no hypermetabolic activity within the adrenal glands, pancreas or spleen. There is no hypermetabolic nodal activity. Previously demonstrated large left nodal mass has been surgically removed, and no residual hypermetabolic activity is present in this area. Incidental CT findings: Stable umbilical hernia containing only fat, colonic diverticulosis and aortic atherosclerosis. SKELETON: There is no hypermetabolic activity to suggest osseous metastatic disease. Incidental CT findings: none EXTREMITIES: There is low-level hypermetabolic activity within the subcutaneous fat medial to the left knee, corresponding with surgical clips and attributed to previous saphenous venous harvesting for CABG. Correlate clinically. No other suspicious soft tissue activity within the extremities. Incidental CT findings: none IMPRESSION: 1. The new lesion of concern in the left hepatic lobe is not optimally evaluated based on small size, but does demonstrate hypermetabolic activity, suspicious for metastatic disease. Assuming the patient is unable to undergo MRI due to his pacemaker, recommend attention on follow-up CT in 3 months. 2. No other evidence of metastatic disease. 3. 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 Electronically Signed   By: Richardean Sale M.D.   On: 10/08/2021 15:01   US LIVER W/CM 1ST LESION  Result Date: 09/23/2021 CLINICAL DATA:  61 year old male with history of  melanoma with development of new left lobe lesion measuring up to 9 mm on recent staging CT. The patient presents for contrast enhanced ultrasound for further characterization. EXAM: US LIVER WITH CONTRAST CONTRAST:  2.4 mL sulfur hexafluoride Number of injections: 1 COMPARISON:  09/15/2021 FINDINGS: LIVER Size: Normal. Echogenicity: Within normal limits. Surface Contour: Smooth. Observation 1 Lobe: Left Size: 2.2 cm Grayscale features: Hypoechoic with isoechoic center. Arterial phase enhancement features: Mildly hypoenhancing Onset of washout: 60 seconds Degree of washout: Marked Tumor in vein: No CEUS LI-RADS category: Not applicable in the absence of cirrhosis. IMPRESSION: Mildly hypoenhancing 2.2 cm mass in the posterior aspect of the left lobe of the liver with washout characteristics concerning for non hepatocellular malignancy, concerning for melanotic metastasis given history. The lesion is in an unfavorable location for percutaneous biopsy. Consider PET-CT for further characterization. Ruthann Cancer, MD [facility] Electronically Signed   By: Ruthann Cancer M.D.   On: 09/23/2021 15:39   CT CHEST ABDOMEN PELVIS W CONTRAST  Result Date: 09/15/2021 CLINICAL DATA:  History of melanoma, follow-up. * Tracking Code: BO * EXAM: CT CHEST, ABDOMEN, AND PELVIS WITH CONTRAST TECHNIQUE: Multidetector CT imaging of the chest, abdomen and pelvis was performed following the standard protocol during bolus administration of intravenous contrast. RADIATION DOSE REDUCTION: This exam was performed according to the departmental dose-optimization program which includes automated exposure control, adjustment of the mA and/or kV according to patient size and/or use of iterative reconstruction technique. CONTRAST:  82m OMNIPAQUE IOHEXOL 300 MG/ML  SOLN COMPARISON:  Multiple priors including most recent CT April 26, 2021 FINDINGS: CT CHEST FINDINGS Cardiovascular:  Accessed right chest Port-A-Cath with tip near the superior cavoatrial junction. Left chest cardiac conduction device with leads in the right atrium and right ventricle. Interval median sternotomy and CABG. Aortic atherosclerosis without aneurysmal dilation. No central pulmonary  embolus on this nondedicated study. Normal size heart. Trace pericardial effusion there are postoperative. Mediastinum/Nodes: Mild stranding in the anterior mediastinum most likely reflect sequela of interval CABG. Hypodense 15 mm nodule in the right lobe of the thyroid on image 6/2. No pathologically enlarged mediastinal, hilar or axillary lymph nodes. Esophagus is grossly unremarkable. Lungs/Pleura: No suspicious pulmonary nodules or masses. No focal airspace consolidation. No pleural effusion. No pneumothorax. Musculoskeletal: No aggressive lytic or blastic lesion of bone. Median sternotomy wires are intact. Anterior chest wall incision. Bilateral gynecomastia. CT ABDOMEN PELVIS FINDINGS Hepatobiliary: Subtle 9 mm hypodense lesion in the left lobe of the liver on image 55/2 is new from prior imaging including contrasted CTs dating back to December 10, 2019, with the lesion appearing to equilibrate to background liver on delayed imaging. Similar tiny low-density lesion in the right lobe of the liver are too small to accurately characterize but stable over multiple priors and favored to reflect a cyst. No solid enhancing hepatic lesion. Gallbladder is unremarkable. No biliary ductal dilation. Pancreas: No pancreatic ductal dilation or evidence of acute inflammation. Spleen: No splenomegaly or focal splenic lesion. Adrenals/Urinary Tract: Bilateral adrenal glands appear normal. No hydronephrosis. Hypodense 2 cm right renal cyst is considered benign requiring no independent imaging follow-up. Urinary bladder is unremarkable for degree of distension within limitation of streak artifact from left hip arthroplasty. Stomach/Bowel: Radiopaque  enteric contrast material traverses the splenic flexure. Stomach is unremarkable for degree of distension. No pathologic dilation of small or large bowel. Left-sided colonic diverticulosis without findings of acute diverticulitis. No evidence of acute bowel inflammation. Vascular/Lymphatic: Aortic atherosclerosis without abdominal aortic aneurysm. No pathologically enlarged abdominal or pelvic lymph nodes. Reproductive: Prostate is unremarkable. Other: Similar postsurgical changes in the left groin without new enhancing soft tissue in the surgical bed. Moderate-sized fat containing ventral hernia. Musculoskeletal: Prior left total hip arthroplasty. No aggressive lytic or blastic lesion of bone. IMPRESSION: 1. Subtle hypodense 9 mm lesion in the left lobe of the liver is new from prior imaging including previous contrasted CT dating back to December 10, 2019, with the lesion appearing to equilibrate with background liver on delayed imaging sequence but is incompletely evaluated on this imaging study and technically nonspecific possibly reflecting a benign perfusional variant and while its appearance is not typical for that of a melanoma metastasis, it is not excluded on this examination. Suggest more definitive characterization by hepatic protocol MRI with and without contrast. 2. Stable postoperative changes in the left groin without evidence of local recurrent disease. 3. No evidence of metastatic disease in the chest or pelvis. 4.  Aortic Atherosclerosis (ICD10-I70.0). Electronically Signed   By: Dahlia Bailiff M.D.   On: 09/15/2021 16:33   DG Chest 2 View  Result Date: 09/14/2021 CLINICAL DATA:  Status post coronary artery bypass grafts EXAM: CHEST - 2 VIEW COMPARISON:  08/14/2021 FINDINGS: Transverse diameter of heart is slightly increased. There is previous coronary artery bypass surgery. Pacemaker battery is seen in the left infraclavicular region with tips of leads in right atrium and right ventricle.  Tip of right IJ chest port is seen in the superior vena cava. IMPRESSION: There are no new infiltrates or signs of pulmonary edema. Electronically Signed   By: Elmer Picker M.D.   On: 09/14/2021 13:49   DG Chest 2 View  Result Date: 08/14/2021 CLINICAL DATA:  Elevated myocardial infarction. EXAM: CHEST - 2 VIEW COMPARISON:  Aug 13, 2021 FINDINGS: Stable right Port-A-Cath in good position. Stable pacemaker. The cardiomediastinal  silhouette is stable. Sternotomy wires are intact. No pneumothorax. No nodules or masses. No focal infiltrates. No overt edema. IMPRESSION: No active cardiopulmonary disease. Electronically Signed   By: Dorise Bullion III M.D.   On: 08/14/2021 08:51   DG Chest Port 1 View  Result Date: 08/13/2021 CLINICAL DATA:  Status post coronary artery bypass surgery EXAM: PORTABLE CHEST 1 VIEW COMPARISON:  Previous studies including the examination of 08/12/2021 FINDINGS: Left chest tube is seen. There is no demonstrable pneumothorax. There is interval decrease in linear densities in the medial left lower lung fields. There are no signs of pulmonary edema or new focal infiltrates. There is coronary bypass surgery. Tip of right IJ chest port is seen in the superior vena cava. Pacemaker battery is seen in the left infraclavicular region. There is interval removal of vascular sheath from right IJ. IMPRESSION: There is slight improvement in subsegmental atelectasis seen in the left lower lung fields. Other findings as described in the body of the report. Electronically Signed   By: Elmer Picker M.D.   On: 08/13/2021 09:43   DG Chest Port 1 View  Result Date: 08/12/2021 CLINICAL DATA:  History of CABG. EXAM: PORTABLE CHEST 1 VIEW COMPARISON:  Aug 11, 2021. FINDINGS: RIGHT IJ vascular sheath remains in place terminating in the region of the brachiocephalic junction. RIGHT-sided Port-A-Cath with tip in the area of the caval to atrial junction. LEFT sided chest tube in place, tip near  the LEFT lung apex. Lower mediastinal drain tip projecting over the lower chest/upper abdomen on the radiograph. LEFT-sided dual lead pacer device remains in place. Post median sternotomy for CABG. No change in cardiomediastinal contours. Lung volumes are slightly increased following extubation. No visible pneumothorax. No dense consolidation with subtle basilar opacities LEFT greater than RIGHT and with slight improvement since the prior study. On limited assessment no acute skeletal findings. IMPRESSION: 1. Status post extubation with improved lung volumes. 2. No pneumothorax. 3. Slight improvement in aeration of the lung bases with subtle basilar opacities LEFT greater than RIGHT, likely attributed to atelectasis. 4. Removal of Swan-Ganz catheter, vascular sheath remains in place via RIGHT IJ approach. Electronically Signed   By: Zetta Bills M.D.   On: 08/12/2021 07:56   DG Chest Port 1 View  Result Date: 08/11/2021 CLINICAL DATA:  Status post CABG yesterday. EXAM: PORTABLE CHEST 1 VIEW COMPARISON:  Chest x-ray from yesterday. FINDINGS: Unchanged endotracheal tube, right internal jugular Swan-Ganz catheter, right chest wall port catheter, mediastinal drain, and left chest tube. Unchanged left chest wall pacemaker. Stable cardiomediastinal silhouette status post CABG. Unchanged retrocardiac left lower lobe atelectasis. No focal consolidation, pleural effusion, or pneumothorax. IMPRESSION: 1. Stable support lines and tubes. 2. Unchanged left lower lobe atelectasis. No pneumothorax. Electronically Signed   By: Titus Dubin M.D.   On: 08/11/2021 07:52   DG Abd 1 View  Result Date: 08/10/2021 CLINICAL DATA:  Encounter for OG tube placement. EXAM: ABDOMEN - 1 VIEW COMPARISON:  Recent similar study today at 9:51 p.m. FINDINGS: 10:48 p.m., 08/10/2021. OG tube is in place interval advanced with tip now in the body of the stomach and appears adequately inserted. The bowel pattern is nonobstructive as far as  visualized with the pelvic bowel not included in the exam. The lateral aspect of the right hemiabdomen was also cropped out. There is no supine evidence of free air. The visible visceral shadows are stable. No pathologic calcification is visible. IMPRESSION: OGT has been advanced with the tip now in the  body of the stomach, grossly adequately inserted. Electronically Signed   By: Telford Nab M.D.   On: 08/10/2021 23:26   DG Abd 1 View  Result Date: 08/10/2021 CLINICAL DATA:  Check gastric catheter placement EXAM: ABDOMEN - 1 VIEW COMPARISON:  None Available. FINDINGS: Gastric catheter is noted with the tip in the stomach. Proximal side port lies in the distal esophagus. This should be advanced several cm deeper into the stomach. IMPRESSION: Gastric catheter as described. This should be advanced deeper into the stomach. Electronically Signed   By: Inez Catalina M.D.   On: 08/10/2021 21:58   DG Chest Port 1 View  Result Date: 08/10/2021 CLINICAL DATA:  Intubation EXAM: PORTABLE CHEST 1 VIEW COMPARISON:  08/10/2021 at 3:21 p.m. FINDINGS: Single frontal view of the chest demonstrates endotracheal tube overlying tracheal air column tip just below thoracic inlet. Right chest wall port and flow directed right internal jugular central venous catheter unchanged. Left chest tube and mediastinal drain are again noted and stable. Epicardial pacing wires and dual lead pacemaker are unchanged. Postsurgical changes from CABG. Cardiac silhouette is unremarkable. Stable consolidation within the medial left lung base consistent with atelectasis. No effusion or pneumothorax. No acute bony abnormalities. IMPRESSION: 1. Support devices as above, with appropriate position of the endotracheal tube just below thoracic inlet. 2. Continued left lower lobe atelectasis. 3. No evidence of pneumothorax. Electronically Signed   By: Randa Ngo M.D.   On: 08/10/2021 17:14   DG Chest Port 1 View  Result Date: 08/10/2021 CLINICAL  DATA:  Pneumothorax. EXAM: PORTABLE CHEST 1 VIEW COMPARISON:  Radiographs 08/10/2021 and 08/05/2021.  CT 04/26/2021. FINDINGS: 1521 hours. Interval median sternotomy and CABG. Tip of the endotracheal tube is just above the carina and could be pulled back 3 cm for more optimal positioning. Right IJ Swan-Ganz catheter extends into the main pulmonary artery. A right IJ Port-A-Cath appears unchanged at the level of the upper right atrium. Left subclavian pacemaker leads appear unchanged. A left-sided chest tube is in place. There are low lung volumes with mild left lung atelectasis. No evidence of pneumothorax or significant pleural effusion. The heart size is stable. IMPRESSION: 1. Interval CABG without evidence of pneumothorax. 2. Support system positioned as above. Tip of the endotracheal tube is just above the carina and could be pulled back 3 cm for more optimal positioning. Electronically Signed   By: Richardean Sale M.D.   On: 08/10/2021 15:38   DG Chest 2 View  Result Date: 08/10/2021 CLINICAL DATA:  Scheduled for CABG May 17. Preoperative chest radiograph. EXAM: CHEST - 2 VIEW COMPARISON:  Portable chest 08/05/2021 FINDINGS: The heart size and mediastinal contours are within normal limits. Both lungs are clear. The visualized skeletal structures are unremarkable. A right IJ port catheter again terminates at the level of the cavoatrial junction. There is a left chest dual lead pacing system with stable wire insertions. IMPRESSION: No evidence of acute chest disease or interval changes. Electronically Signed   By: Telford Nab M.D.   On: 08/10/2021 00:33   VAS US DOPPLER PRE CABG  Result Date: 08/10/2021 PREOPERATIVE VASCULAR EVALUATION Patient Name:  JOESIAH LONON  Date of Exam:   08/09/2021 Medical Rec #: [id number]     Accession #:    [id number] Date of Birth: Apr 26, 1960     Patient Gender: M Patient Age:   40 years Exam Location:  [facility] Procedure:      VAS US DOPPLER PRE CABG Referring  Phys: Collier Salina  VANTRIGT --------------------------------------------------------------------------------  Indications:      Pre-CABG. Risk Factors:     Hypertension, hyperlipidemia, no history of smoking, prior MI,                   coronary artery disease. Comparison Study: No prior studies Performing Technologist: Darlin Coco RDMS, RVT  Examination Guidelines: A complete evaluation includes B-mode imaging, spectral Doppler, color Doppler, and power Doppler as needed of all accessible portions of each vessel. Bilateral testing is considered an integral part of a complete examination. Limited examinations for reoccurring indications may be performed as noted.  Right Carotid Findings: +----------+--------+--------+--------+--------+------------------+           PSV cm/sEDV cm/sStenosisDescribeComments           +----------+--------+--------+--------+--------+------------------+ CCA Prox  130     20                                         +----------+--------+--------+--------+--------+------------------+ CCA Distal108     25                      intimal thickening +----------+--------+--------+--------+--------+------------------+ ICA Prox  102     22      1-39%           intimal thickening +----------+--------+--------+--------+--------+------------------+ ICA Distal104     31                                         +----------+--------+--------+--------+--------+------------------+ ECA       99      15                                         +----------+--------+--------+--------+--------+------------------+ +----------+--------+-------+---------+------------+           PSV cm/sEDV cmsDescribe Arm Pressure +----------+--------+-------+---------+------------+ Subclavian198            Turbulent             +----------+--------+-------+---------+------------+ +---------+--------+--+--------+--+---------+ VertebralPSV cm/s43EDV cm/s13Antegrade  +---------+--------+--+--------+--+---------+ Left Carotid Findings: +----------+--------+--------+--------+------------+--------+           PSV cm/sEDV cm/sStenosisDescribe    Comments +----------+--------+--------+--------+------------+--------+ CCA Prox  161     34                                   +----------+--------+--------+--------+------------+--------+ CCA Distal150     31                                   +----------+--------+--------+--------+------------+--------+ ICA Prox  98      29      1-39%   heterogenous         +----------+--------+--------+--------+------------+--------+ ICA Distal106     41                                   +----------+--------+--------+--------+------------+--------+ ECA       167     21                                   +----------+--------+--------+--------+------------+--------+ +----------+--------+--------+---------+------------+  SubclavianPSV cm/sEDV cm/sDescribe Arm Pressure +----------+--------+--------+---------+------------+           201             Turbulent             +----------+--------+--------+---------+------------+ +---------+--------+--+--------+-+---------+ VertebralPSV cm/s49EDV cm/s9Antegrade +---------+--------+--+--------+-+---------+  ABI Findings: +--------+------------------+-----+---------+--------+ Right   Rt Pressure (mmHg)IndexWaveform Comment  +--------+------------------+-----+---------+--------+ Brachial                       triphasic         +--------+------------------+-----+---------+--------+ PTA                            triphasic         +--------+------------------+-----+---------+--------+ DP                             triphasic         +--------+------------------+-----+---------+--------+ +--------+------------------+-----+---------+-------+ Left    Lt Pressure (mmHg)IndexWaveform Comment  +--------+------------------+-----+---------+-------+ Brachial                       triphasic        +--------+------------------+-----+---------+-------+ PTA                            triphasic        +--------+------------------+-----+---------+-------+ DP                             triphasic        +--------+------------------+-----+---------+-------+  Right Doppler Findings: +--------+--------+-----+---------+--------+ Site    PressureIndexDoppler  Comments +--------+--------+-----+---------+--------+ Brachial             triphasic         +--------+--------+-----+---------+--------+ Radial               triphasic         +--------+--------+-----+---------+--------+ Ulnar                triphasic         +--------+--------+-----+---------+--------+  Left Doppler Findings: +--------+--------+-----+---------+--------+ Site    PressureIndexDoppler  Comments +--------+--------+-----+---------+--------+ Brachial             triphasic         +--------+--------+-----+---------+--------+ Radial               triphasic         +--------+--------+-----+---------+--------+ Ulnar                triphasic         +--------+--------+-----+---------+--------+  Summary: Right Carotid: Velocities in the right ICA are consistent with a 1-39% stenosis. Left Carotid: Velocities in the left ICA are consistent with a 1-39% stenosis. Vertebrals:  Bilateral vertebral arteries demonstrate antegrade flow. Subclavians: Bilateral subclavian artery flow was disturbed. Right Upper Extremity: Doppler waveforms remain within normal limits with right radial compression. Doppler waveforms remain within normal limits with right ulnar compression. Left Upper Extremity: Doppler waveforms remain within normal limits with left radial compression. Doppler waveforms remain within normal limits with left ulnar compression.  Electronically signed by Harold Barban MD on 08/10/2021 at  12:10:21 AM.    Final    ECHOCARDIOGRAM COMPLETE  Result Date: 08/08/2021    ECHOCARDIOGRAM REPORT   Patient Name:   NICOLO TOMKO Date of Exam: 08/08/2021 Medical Rec #:  [id number]  Height:       67.0 in Accession #:    [id number]   Weight:       250.0 lb Date of Birth:  [birth year]    BSA:          2.223 m Patient Age:    9 years     BP:           138/66 mmHg Patient Gender: M            HR:           68 bpm. Exam Location:  [facility] Procedure: 2D Echo, Cardiac Doppler and Color Doppler Indications:     NSTEMI I21.4  History:         Patient has prior history of Echocardiogram examinations, most                  recent 08/26/2018. CAD, Pacemaker, Arrythmias:LBBB; Risk                  Factors:Hypertension.  Sonographer:     Sherrie Sport Referring Phys:  [id number] EXNTZGYF AGBATA Diagnosing Phys: Yolonda Kida MD  Sonographer Comments: Suboptimal apical window and no subcostal window. IMPRESSIONS  1. Left ventricular ejection fraction, by estimation, is 65 to 70%. The left ventricle has normal function. The left ventricle has no regional wall motion abnormalities. Left ventricular diastolic parameters are consistent with Grade I diastolic dysfunction (impaired relaxation).  2. Right ventricular systolic function is normal. The right ventricular size is normal.  3. The mitral valve is normal in structure. Trivial mitral valve regurgitation.  4. The aortic valve is normal in structure. Aortic valve regurgitation is not visualized. FINDINGS  Left Ventricle: Left ventricular ejection fraction, by estimation, is 65 to 70%. The left ventricle has normal function. The left ventricle has no regional wall motion abnormalities. The left ventricular internal cavity size was normal in size. There is  no left ventricular hypertrophy. Left ventricular diastolic parameters are consistent with Grade I diastolic dysfunction (impaired relaxation). Right Ventricle: The right ventricular size is normal. No increase in right ventricular  wall thickness. Right ventricular systolic function is normal. Left Atrium: Left atrial size was normal in size. Right Atrium: Right atrial size was normal in size. Pericardium: There is no evidence of pericardial effusion. Mitral Valve: The mitral valve is normal in structure. Trivial mitral valve regurgitation. MV peak gradient, 3.9 mmHg. The mean mitral valve gradient is 2.0 mmHg. Tricuspid Valve: The tricuspid valve is normal in structure. Tricuspid valve regurgitation is trivial. Aortic Valve: The aortic valve is normal in structure. Aortic valve regurgitation is not visualized. Aortic valve mean gradient measures 5.0 mmHg. Aortic valve peak gradient measures 8.4 mmHg. Aortic valve area, by VTI measures 2.02 cm. Pulmonic Valve: The pulmonic valve was normal in structure. Pulmonic valve regurgitation is not visualized. Aorta: The ascending aorta was not well visualized. IAS/Shunts: No atrial level shunt detected by color flow Doppler.  LEFT VENTRICLE PLAX 2D LVIDd:         5.40 cm   Diastology LVIDs:         3.40 cm   LV e' medial:    7.94 cm/s LV PW:         1.30 cm   LV E/e' medial:  9.8 LV IVS:        0.80 cm   LV e' lateral:   5.87 cm/s LVOT diam:     2.00 cm   LV E/e' lateral: 13.3 LV SV:  64 LV SV Index:   29 LVOT Area:     3.14 cm  RIGHT VENTRICLE RV Basal diam:  3.90 cm RV S prime:     15.30 cm/s TAPSE (M-mode): 2.6 cm LEFT ATRIUM             Index        RIGHT ATRIUM           Index LA diam:        3.70 cm 1.66 cm/m   RA Area:     14.60 cm LA Vol (A2C):   46.8 ml 21.05 ml/m  RA Volume:   37.60 ml  16.91 ml/m LA Vol (A4C):   55.1 ml 24.78 ml/m LA Biplane Vol: 53.6 ml 24.11 ml/m  AORTIC VALVE                     PULMONIC VALVE AV Area (Vmax):    2.19 cm      PV Vmax:        1.16 m/s AV Area (Vmean):   1.96 cm      PV Vmean:       71.200 cm/s AV Area (VTI):     2.02 cm      PV VTI:         0.259 m AV Vmax:           145.00 cm/s   PV Peak grad:   5.4 mmHg AV Vmean:          103.450 cm/s   PV Mean grad:   2.0 mmHg AV VTI:            0.317 m       RVOT Peak grad: 9 mmHg AV Peak Grad:      8.4 mmHg AV Mean Grad:      5.0 mmHg LVOT Vmax:         101.00 cm/s LVOT Vmean:        64.700 cm/s LVOT VTI:          0.204 m LVOT/AV VTI ratio: 0.64  AORTA Ao Root diam: 3.10 cm MITRAL VALVE               TRICUSPID VALVE MV Area (PHT): 3.31 cm    TR Peak grad:   17.5 mmHg MV Area VTI:   1.95 cm    TR Vmax:        209.00 cm/s MV Peak grad:  3.9 mmHg MV Mean grad:  2.0 mmHg    SHUNTS MV Vmax:       0.99 m/s    Systemic VTI:  0.20 m MV Vmean:      61.8 cm/s   Systemic Diam: 2.00 cm MV Decel Time: 229 msec    Pulmonic VTI:  0.337 m MV E velocity: 78.00 cm/s MV A velocity: 91.70 cm/s MV E/A ratio:  0.85 Yolonda Kida MD Electronically signed by Yolonda Kida MD Signature Date/Time: 08/08/2021/5:32:25 PM    Final    CARDIAC CATHETERIZATION  Result Date: 08/08/2021   Mid RCA lesion is 45% stenosed.   Mid LM lesion is 80% stenosed.   The left ventricular systolic function is normal.   LV end diastolic pressure is normal.   The left ventricular ejection fraction is greater than 65% by visual estimate. 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 Normal LV systolic function  with mild septal hypokinesis due to pacemaker with ejection fraction of 50% Significant progression of distal left main coronary atherosclerosis to 75 to 80% and proximal right coronary artery to 40% Plan Proceed to coronary artery bypass grafting due to significant progression of left main coronary artery disease, non-ST elevation myocardial infarction, and abnormal stress test 3/23 High intensity cholesterol therapy Cardiac rehabilitation Further treatment options after above

## 2021-11-05 NOTE — Assessment & Plan Note (Signed)
Iron panel showed iron deficiency.  Recommend ferrous sulfate '325mg'$  BID.  Continue monitor.

## 2021-11-05 NOTE — Assessment & Plan Note (Signed)
Left inguinal nodal recurrence of melanoma.  Status post resection and adjuvant radiation. S/p 2 years of Nivolumab maintenance.  Recent CT, enhanced US findings showed small liver lesion which is not amendable for biopsy.  Discussed with patient and shared decision was made to resume on Nivolumab and short term image follow up Labs are reviewed and discussed with patient. Proceed with Nivolumab today

## 2021-11-05 NOTE — Assessment & Plan Note (Signed)
Immunotherapy plan as listed above 

## 2021-11-05 NOTE — Progress Notes (Signed)
ON PATHWAY REGIMEN - Melanoma and Other Skin Cancers  No Change  Continue With Treatment as Ordered.  Original Decision Date/Time: 10/17/2021 09:20     Cycles 1 through 4: A cycle is every 21 days:     Nivolumab      Ipilimumab    Cycles 5 and beyond: A cycle is every 14 days:     Nivolumab   **Always confirm dose/schedule in your pharmacy ordering system**  Patient Characteristics: Melanoma, Cutaneous/Unknown Primary, Distant Metastases, Unresectable, No Brain Metastases, First Line, BRAF V600 Wild Type / BRAF V600 Results Pending or Unknown, Candidate for Immunotherapy Disease Classification: Melanoma Disease Subtype: Cutaneous BRAF V600 Mutation Status: BRAF V600 Wild Type (No Mutation) Therapeutic Status: Distant Metastases Line of Therapy: First Line Immunotherapy Candidate Status: Candidate for Immunotherapy Intent of Therapy: Non-Curative / Palliative Intent, Discussed with Patient

## 2021-11-07 ENCOUNTER — Encounter: Payer: Medicare Other | Admitting: *Deleted

## 2021-11-07 DIAGNOSIS — Z951 Presence of aortocoronary bypass graft: Secondary | ICD-10-CM | POA: Diagnosis not present

## 2021-11-07 NOTE — Progress Notes (Signed)
Daily Session Note  Patient Details  Name: Edward Pitts MRN: [id number] Date of Birth: [birth year] Referring Provider:   Flowsheet Row Cardiac Rehab from 09/19/2021 in [facility] Cardiac and Pulmonary Rehab  Referring Provider Serafina Royals MD       Encounter Date: 11/07/2021  Check In:  Session Check In - 11/07/21 0835       Check-In   Supervising physician immediately available to respond to emergencies See telemetry face sheet for immediately available ER MD    Location [facility]-Cardiac & Pulmonary Rehab    Staff Present Heath Lark, RN, BSN, CCRP;Laureen Owens Shark, BS, RRT, CPFT;Kelly Amedeo Plenty, BS, ACSM CEP, Exercise Physiologist    Virtual Visit No    Medication changes reported     No    Fall or balance concerns reported    No    Warm-up and Cool-down Performed on first and last piece of equipment    Resistance Training Performed Yes    VAD Patient? No    PAD/SET Patient? No      Pain Assessment   Currently in Pain? No/denies                Social History   Tobacco Use  Smoking Status Never  Smokeless Tobacco Never    Goals Met:  Independence with exercise equipment Exercise tolerated well No report of concerns or symptoms today  Goals Unmet:  Not Applicable  Comments: Pt able to follow exercise prescription today without complaint.  Will continue to monitor for progression.    Dr. Emily Filbert is Medical Director for [facility].  Dr. Ottie Glazier is Medical Director for [facility] Pulmonary Rehabilitation.

## 2021-11-07 NOTE — Progress Notes (Signed)
Completed initial RD consultation ?

## 2021-11-09 ENCOUNTER — Encounter: Payer: Medicare Other | Admitting: *Deleted

## 2021-11-09 ENCOUNTER — Inpatient Hospital Stay: Payer: 59

## 2021-11-09 DIAGNOSIS — Z951 Presence of aortocoronary bypass graft: Secondary | ICD-10-CM

## 2021-11-09 NOTE — Progress Notes (Signed)
Daily Session Note  Patient Details  Name: Edward Pitts MRN: [id number] Date of Birth: 07/03/1960 Referring Provider:   Flowsheet Row Cardiac Rehab from 09/19/2021 in [facility] Cardiac and Pulmonary Rehab  Referring Provider Serafina Royals MD       Encounter Date: 11/09/2021  Check In:  Session Check In - 11/09/21 0841       Check-In   Supervising physician immediately available to respond to emergencies See telemetry face sheet for immediately available ER MD    Location [facility]-Cardiac & Pulmonary Rehab    Staff Present Heath Lark, RN, BSN, CCRP;Jessica [place], MA, RCEP, CCRP, CCET;Melissa [place], RDN, [employer], BS, Exercise Physiologist;Joseph [place], [place]    Virtual Visit No    Medication changes reported     No    Fall or balance concerns reported    No    Warm-up and Cool-down Performed on first and last piece of equipment    Resistance Training Performed Yes    VAD Patient? No    PAD/SET Patient? No      Pain Assessment   Currently in Pain? No/denies                Social History   Tobacco Use  Smoking Status Never  Smokeless Tobacco Never    Goals Met:  Independence with exercise equipment Exercise tolerated well No report of concerns or symptoms today  Goals Unmet:  Not Applicable  Comments: Pt able to follow exercise prescription today without complaint.  Will continue to monitor for progression.    Dr. Emily Filbert is Medical Director for [facility].  Dr. Ottie Glazier is Medical Director for [facility].

## 2021-11-10 ENCOUNTER — Ambulatory Visit (HOSPITAL_COMMUNITY)
Admission: RE | Admit: 2021-11-10 | Discharge: 2021-11-10 | Disposition: A | Discharge status: Home / Self Care | Payer: Medicare Other | Source: Ambulatory Visit | Attending: Oncology | Admitting: Oncology

## 2021-11-10 ENCOUNTER — Inpatient Hospital Stay: Payer: 59

## 2021-11-10 DIAGNOSIS — N281 Cyst of kidney, acquired: Secondary | ICD-10-CM | POA: Diagnosis not present

## 2021-11-10 DIAGNOSIS — C438 Malignant melanoma of overlapping sites of skin: Secondary | ICD-10-CM | POA: Insufficient documentation

## 2021-11-10 DIAGNOSIS — C779 Secondary and unspecified malignant neoplasm of lymph node, unspecified: Secondary | ICD-10-CM | POA: Diagnosis not present

## 2021-11-10 DIAGNOSIS — K760 Fatty (change of) liver, not elsewhere classified: Secondary | ICD-10-CM | POA: Diagnosis not present

## 2021-11-10 DIAGNOSIS — C439 Malignant melanoma of skin, unspecified: Secondary | ICD-10-CM | POA: Diagnosis not present

## 2021-11-10 MED ORDER — GADOBUTROL 1 MMOL/ML IV SOLN
10.0000 mL | Freq: Once | INTRAVENOUS | Status: AC | PRN
Start: 2021-11-10 — End: 2021-11-10
  Administered 2021-11-10: 10 mL via INTRAVENOUS

## 2021-11-10 MED ORDER — HEPARIN SOD (PORK) LOCK FLUSH 100 UNIT/ML IV SOLN
500.0000 [IU] | INTRAVENOUS | Status: AC | PRN
  Administered 2021-11-10: 500 [IU]

## 2021-11-11 ENCOUNTER — Encounter: Payer: Medicare Other | Admitting: *Deleted

## 2021-11-11 DIAGNOSIS — Z951 Presence of aortocoronary bypass graft: Secondary | ICD-10-CM

## 2021-11-11 NOTE — Progress Notes (Signed)
Daily Session Note  Patient Details  Name: Edward Pitts MRN: [id number] Date of Birth: September 09, 1960 Referring Provider:   Flowsheet Row Cardiac Rehab from 09/19/2021 in [facility] Cardiac and Pulmonary Rehab  Referring Provider Serafina Royals MD       Encounter Date: 11/11/2021  Check In:  Session Check In - 11/11/21 0845       Check-In   Supervising physician immediately available to respond to emergencies See telemetry face sheet for immediately available ER MD    Location [facility]-Cardiac & Pulmonary Rehab    Staff Present Heath Lark, RN, BSN, CCRP;Joseph [place], RCP,RRT,BSRT;Jessica [place], [place], [place], CCRP, CCET    Virtual Visit No    Medication changes reported     No    Fall or balance concerns reported    No    Warm-up and Cool-down Performed on first and last piece of equipment    Resistance Training Performed Yes    VAD Patient? No    PAD/SET Patient? No      Pain Assessment   Currently in Pain? No/denies                Social History   Tobacco Use  Smoking Status Never  Smokeless Tobacco Never    Goals Met:  Independence with exercise equipment Exercise tolerated well No report of concerns or symptoms today  Goals Unmet:  Not Applicable  Comments: Pt able to follow exercise prescription today without complaint.  Will continue to monitor for progression.    Dr. Emily Filbert is Medical Director for [facility].  Dr. Ottie Glazier is Medical Director for [facility].

## 2021-11-14 ENCOUNTER — Encounter: Payer: Medicare Other | Admitting: *Deleted

## 2021-11-14 ENCOUNTER — Ambulatory Visit (INDEPENDENT_AMBULATORY_CARE_PROVIDER_SITE_OTHER): Payer: Self-pay | Admitting: Cardiothoracic Surgery

## 2021-11-14 ENCOUNTER — Encounter: Payer: Self-pay | Admitting: Cardiothoracic Surgery

## 2021-11-14 VITALS — BP 129/70 | HR 84 | Resp 20 | Ht 68.5 in | Wt 237.1 lb

## 2021-11-14 DIAGNOSIS — Z951 Presence of aortocoronary bypass graft: Secondary | ICD-10-CM

## 2021-11-14 DIAGNOSIS — Z09 Encounter for follow-up examination after completed treatment for conditions other than malignant neoplasm: Secondary | ICD-10-CM

## 2021-11-14 NOTE — Progress Notes (Signed)
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  Patient is doing well without recurrent angina or symptoms of CHF.  Surgical incisions are well-healed.  Last chest x-ray earlier this summer was clear.  Patient has been attending the cardiac rehab program at [facility] as well as using [employer] for treadmill work. Current Outpatient Medications  Medication Sig Dispense Refill   aspirin 81 MG EC tablet Take 81 mg by mouth daily.     atorvastatin (LIPITOR) 20 MG tablet Take 20 mg by mouth daily.     Carboxymeth-Glyc-Polysorb PF (REFRESH OPTIVE MEGA-3) 0.5-1-0.5 % SOLN Place 1 drop into both eyes daily as needed (dry eyes).     clonazePAM (KLONOPIN) 0.5 MG tablet Take 0.'5mg'$  in AM, '1mg'$  in PM 90 tablet 0   febuxostat (ULORIC) 40 MG tablet Take 40 mg by mouth daily.     ferrous sulfate 325 (65 FE) MG EC tablet Take 1 tablet (325 mg total) by mouth 2 (two) times daily with a meal. 60 tablet 3   hydrochlorothiazide (HYDRODIURIL) 25 MG tablet      HYDROcodone-acetaminophen (NORCO/VICODIN) 5-325 MG tablet      hydrocortisone 2.5 % ointment Apply 1 application. topically daily as needed (itching).     Lacosamide (VIMPAT) 100 MG TABS TAKE 1 TABLET (100 MG TOTAL) BY MOUTH IN THE MORNING AND AT BEDTIME. 60 tablet 3   lidocaine-prilocaine (EMLA) cream Apply 1 application. topically as needed. Apply small amount of cream to port site approx 1-2 hours prior to appointment. 30 g 11   lisinopril (ZESTRIL) 20 MG tablet Take 20 mg by mouth daily.     LORazepam (ATIVAN) 2 MG tablet Take 1 tablet (2 mg total) by mouth every 8 (eight) hours as needed for seizure. 20 tablet 0   metoprolol succinate (TOPROL-XL) 25 MG 24 hr tablet Take 25 mg by mouth daily.     Multiple Vitamin (MULTIVITAMIN WITH MINERALS) TABS tablet Take 1 tablet by mouth daily. Centrum Silver     omeprazole (PRILOSEC) 20 MG capsule TAKE 1 CAPSULE BY MOUTH EVERY DAY 90 capsule 0    ondansetron (ZOFRAN) 4 MG tablet TAKE 1 TABLET BY MOUTH EVERY 8 HOURS AS NEEDED FOR NAUSEA AND VOMITING 90 tablet 1   No current facility-administered medications for this visit.     Physical Exam: Blood pressure 129/70, pulse 84, resp. rate 20, height 5' 8.5" (1.74 m), weight 237 lb 1.9 oz (107.6 kg), SpO2 96 %.   Alert and comfortable Lungs clear Heart rate regular Sternal incision well-healed, right leg incision well-healed  Diagnostic Tests: Last chest x-ray  was reviewed and is clear  Impression: Doing well 3 months status post urgent CABG Continue heart healthy lifestyle and diet and current meds Plan: Return as needed   Dahlia Byes, MD [facility] [phone]

## 2021-11-14 NOTE — Progress Notes (Signed)
Daily Session Note  Patient Details  Name: Edward Pitts MRN: [id number] Date of Birth: 1960/04/03 Referring Provider:   Flowsheet Row Cardiac Rehab from 09/19/2021 in [facility] Cardiac and Pulmonary Rehab  Referring Provider Serafina Royals MD       Encounter Date: 11/14/2021  Check In:  Session Check In - 11/14/21 1146       Check-In   Supervising physician immediately available to respond to emergencies See telemetry face sheet for immediately available ER MD    Location [facility]-Cardiac & Pulmonary Rehab    Staff Present Heath Lark, RN, BSN, CCRP;Joseph [place], RCP,RRT,BSRT;Kelly [place], [place], ACSM CEP, Exercise Physiologist    Virtual Visit No    Medication changes reported     No    Fall or balance concerns reported    No    Warm-up and Cool-down Performed on first and last piece of equipment    Resistance Training Performed Yes    VAD Patient? No    PAD/SET Patient? No      Pain Assessment   Currently in Pain? No/denies                Social History   Tobacco Use  Smoking Status Never  Smokeless Tobacco Never    Goals Met:  Independence with exercise equipment Exercise tolerated well No report of concerns or symptoms today  Goals Unmet:  Not Applicable  Comments: Pt able to follow exercise prescription today without complaint.  Will continue to monitor for progression.    Dr. Emily Filbert is Medical Director for [facility].  Dr. Ottie Glazier is Medical Director for [facility] Pulmonary Rehabilitation.

## 2021-11-16 ENCOUNTER — Encounter: Payer: Medicare Other | Admitting: *Deleted

## 2021-11-16 DIAGNOSIS — Z951 Presence of aortocoronary bypass graft: Secondary | ICD-10-CM | POA: Diagnosis not present

## 2021-11-16 NOTE — Progress Notes (Signed)
Daily Session Note  Patient Details  Name: Edward Pitts MRN: [id number] Date of Birth: 04/16/1960 Referring Provider:   Flowsheet Row Cardiac Rehab from 09/19/2021 in [facility] Cardiac and Pulmonary Rehab  Referring Provider Kowalski, Bruce MD       Encounter Date: 11/16/2021  Check In:  Session Check In - 11/16/21 0842       Check-In   Supervising physician immediately available to respond to emergencies See telemetry face sheet for immediately available ER MD    Location [facility]-Cardiac & Pulmonary Rehab    Staff Present Susanne Bice, RN, BSN, CCRP;Melissa [place]ai[place], RDN, LDN;Jessica [place], MA, RCEP, CCRP, CCET;Joseph [place], RCP,RRT,BSRT    Virtual Visit No    Medication changes reported     No    Fall or balance concerns reported    No    Warm-up and Cool-down Performed on first and last piece of equipment    Resistance Training Performed Yes    VAD Patient? No    PAD/SET Patient? No      Pain Assessment   Currently in Pain? No/denies                Social History   Tobacco Use  Smoking Status Never  Smokeless Tobacco Never    Goals Met:  Independence with exercise equipment Exercise tolerated well No report of concerns or symptoms today  Goals Unmet:  Not Applicable  Comments: Pt able to follow exercise prescription today without complaint.  Will continue to monitor for progression.    Dr. Mark Miller is Medical Director for [facility].  Dr. Fuad Aleskerov is Medical Director for [facility]monary [facility]. 

## 2021-11-18 ENCOUNTER — Encounter: Payer: Self-pay | Admitting: Oncology

## 2021-11-18 ENCOUNTER — Inpatient Hospital Stay: Payer: Medicare Other

## 2021-11-18 ENCOUNTER — Inpatient Hospital Stay (HOSPITAL_BASED_OUTPATIENT_CLINIC_OR_DEPARTMENT_OTHER): Payer: Medicare Other | Admitting: Oncology

## 2021-11-18 ENCOUNTER — Encounter: Payer: Medicare Other | Admitting: *Deleted

## 2021-11-18 VITALS — BP 132/47 | HR 78 | Temp 98.7°F | Resp 19 | Wt 239.6 lb

## 2021-11-18 DIAGNOSIS — D631 Anemia in chronic kidney disease: Secondary | ICD-10-CM | POA: Diagnosis not present

## 2021-11-18 DIAGNOSIS — Z951 Presence of aortocoronary bypass graft: Secondary | ICD-10-CM

## 2021-11-18 DIAGNOSIS — N1832 Chronic kidney disease, stage 3b: Secondary | ICD-10-CM | POA: Diagnosis not present

## 2021-11-18 DIAGNOSIS — C438 Malignant melanoma of overlapping sites of skin: Secondary | ICD-10-CM | POA: Diagnosis not present

## 2021-11-18 DIAGNOSIS — N1831 Chronic kidney disease, stage 3a: Secondary | ICD-10-CM

## 2021-11-18 DIAGNOSIS — Z5112 Encounter for antineoplastic immunotherapy: Secondary | ICD-10-CM

## 2021-11-18 DIAGNOSIS — Z79899 Other long term (current) drug therapy: Secondary | ICD-10-CM | POA: Diagnosis not present

## 2021-11-18 LAB — CBC WITH DIFFERENTIAL/PLATELET
Abs Immature Granulocytes: 0.03 10*3/uL (ref 0.00–0.07)
Basophils Absolute: 0 10*3/uL (ref 0.0–0.1)
Basophils Relative: 1 %
Eosinophils Absolute: 0.4 10*3/uL (ref 0.0–0.5)
Eosinophils Relative: 7 %
HCT: 34.2 % — ABNORMAL LOW (ref 39.0–52.0)
Hemoglobin: 11.2 g/dL — ABNORMAL LOW (ref 13.0–17.0)
Immature Granulocytes: 1 %
Lymphocytes Relative: 22 %
Lymphs Abs: 1.1 10*3/uL (ref 0.7–4.0)
MCH: 29.9 pg (ref 26.0–34.0)
MCHC: 32.7 g/dL (ref 30.0–36.0)
MCV: 91.2 fL (ref 80.0–100.0)
Monocytes Absolute: 0.5 10*3/uL (ref 0.1–1.0)
Monocytes Relative: 10 %
Neutro Abs: 3 10*3/uL (ref 1.7–7.7)
Neutrophils Relative %: 59 %
Platelets: 205 10*3/uL (ref 150–400)
RBC: 3.75 MIL/uL — ABNORMAL LOW (ref 4.22–5.81)
RDW: 14.3 % (ref 11.5–15.5)
WBC: 5 10*3/uL (ref 4.0–10.5)
nRBC: 0 % (ref 0.0–0.2)

## 2021-11-18 LAB — COMPREHENSIVE METABOLIC PANEL
ALT: 28 U/L (ref 0–44)
AST: 25 U/L (ref 15–41)
Albumin: 4.3 g/dL (ref 3.5–5.0)
Alkaline Phosphatase: 89 U/L (ref 38–126)
Anion gap: 8 (ref 5–15)
BUN: 36 mg/dL — ABNORMAL HIGH (ref 8–23)
CO2: 20 mmol/L — ABNORMAL LOW (ref 22–32)
Calcium: 8.9 mg/dL (ref 8.9–10.3)
Chloride: 109 mmol/L (ref 98–111)
Creatinine, Ser: 1.51 mg/dL — ABNORMAL HIGH (ref 0.61–1.24)
GFR, Estimated: 52 mL/min — ABNORMAL LOW (ref >60)
Glucose, Bld: 120 mg/dL — ABNORMAL HIGH (ref 70–99)
Potassium: 4.3 mmol/L (ref 3.5–5.1)
Sodium: 137 mmol/L (ref 135–145)
Total Bilirubin: 0.5 mg/dL (ref 0.3–1.2)
Total Protein: 7.4 g/dL (ref 6.5–8.1)

## 2021-11-18 MED ORDER — SODIUM CHLORIDE 0.9 % IV SOLN
240.0000 mg | Freq: Once | INTRAVENOUS | Status: AC
  Administered 2021-11-18: 240 mg via INTRAVENOUS
  Filled 2021-11-18: qty 24

## 2021-11-18 MED ORDER — HEPARIN SOD (PORK) LOCK FLUSH 100 UNIT/ML IV SOLN
500.0000 [IU] | Freq: Once | INTRAVENOUS | Status: AC | PRN
  Filled 2021-11-18: qty 5

## 2021-11-18 MED ORDER — HEPARIN SOD (PORK) LOCK FLUSH 100 UNIT/ML IV SOLN
INTRAVENOUS | Status: AC
  Administered 2021-11-18: 500 [IU]
  Filled 2021-11-18: qty 5

## 2021-11-18 MED ORDER — SODIUM CHLORIDE 0.9 % IV SOLN
Freq: Once | INTRAVENOUS | Status: AC
  Filled 2021-11-18: qty 250

## 2021-11-18 NOTE — Assessment & Plan Note (Signed)
Immunotherapy plan as listed above 

## 2021-11-18 NOTE — Assessment & Plan Note (Signed)
avoid nephrotoxins.   creatinine fluctuates. Encourage oral hydration.    

## 2021-11-18 NOTE — Progress Notes (Unsigned)
Hematology/Oncology Progress note [phone] [phone]      Patient Care Team: Mechele Claude, FNP as PCP - General (Family Medicine) [name], MD as Consulting Physician (Oncology) Mickeal Skinner, Acey Lav, MD as Consulting Physician (Oncology) [name], MD as Consulting Physician (Cardiology) Mechele Claude, FNP (Family Medicine)  ASSESSMENT & PLAN:   Cancer Staging  Malignant melanoma of overlapping sites [facility]) Staging form: Melanoma of the Skin, AJCC 8th Edition - Pathologic: Stage Unknown (rpTX, pN1b, cM0) - Signed by [name], MD on 07/27/2020 - Pathologic: No stage assigned - Unsigned   Malignant melanoma of overlapping sites [facility]) Left inguinal nodal recurrence of melanoma.  Status post resection and adjuvant radiation. S/p 2 years of Nivolumab maintenance.  Recent CT, enhanced [place], PET, and MRI  findings showed small liver lesion which is not amendable for biopsy, however suspicious for metastatic disease.  Labs are reviewed and discussed with patient. Proceed with Nivolumab today Short term imaging follow up    Encounter for antineoplastic immunotherapy Immunotherapy plan as listed above  Stage 3a chronic kidney disease ([place]) avoid nephrotoxins.   creatinine fluctuates. Encourage oral hydration.     Anemia in chronic kidney disease Iron panel showed iron deficiency.  Recommend ferrous sulfate $RemoveBeforeD'325mg'CAklHVFlrbDize$  BID.  Continue monitor.     No orders of the defined types were placed in this encounter.  Follow-up Lab MD 2 weeks Nivolumab All questions were answered. The patient knows to call the clinic with any problems, questions or concerns.  [name], MD, PhD [facility] Hematology Oncology 11/18/2021   CHIEF COMPLAINTS/REASON FOR VISIT:  Follow up for melanoma HISTORY OF PRESENTING ILLNESS:   Edward Pitts is a  61 y.o.  male presents for recurrent malignant melanoma.   Oncology History  Malignant melanoma of overlapping sites  [facility])  04/16/2019 Cancer Staging   Staging form: Melanoma of the Skin, AJCC 8th Edition - Pathologic: Stage Unknown (rpTX, pN1b, cM0) - Signed by [name], MD on 07/27/2020 Stage prefix: Recurrence    04/23/2019 Initial Diagnosis   Malignant melanoma   -He has a history of left lower extremity melanoma in 2011, status post local excision -04/16/2019 patient underwent left groin mass resection  Resection pathology showed malignant melanoma, replacing a lymph node, with extracapsular extension, peripheral and deep margins involved.  Left inguinal contents, all 7 lymph nodes were negative for melanoma in the lymph nodes. Extranodal melanoma identified in lymphatic and interstitium between nodes -PDL1 80% TPS    07/07/2019 -  Radiation Therapy   status post adjuvant radiation.   07/23/2019 - 07/21/2021 Chemotherapy   Nivolumab q14d      06/29/2020 Imaging   CT chest abdomen pelvis showed stable postoperative appearance of the left groin.  No evidence of local recurrence.  No evidence of metastatic disease in the chest abdomen or pelvis.  Hepatic steatosis.  Stable subcentimeter fluid attenuation lesion of the lateral right lobe of the liver, likely benign cyst or hemangioma.  Coronary artery disease.  Aortic atherosclerosis   03/10/2021 Imaging   MRI brain without contrast showed no definitive evidence of intracranial metastatic disease.  Study is limited by absence of intravenous contrast.     04/26/2021 Imaging   CT chest abdomen pelvis without contrast showed stable post operative changes of left groin with no evidence of recurrent disease.  No evidence of metastatic disease in the chest abdomen pelvis.  Aortic atherosclerosis   08/08/2021 - 08/16/2021 Hospital Admission    patient was hospitalized due  to NSTEMI status post CABG x3.  He also had pacemaker The echocardiogram showed left ventricular ejection fraction of 65 to 70%,   09/15/2021 Imaging   CT chest abdomen pelvis w contrast   IMPRESSION: 1. Subtle hypodense 9 mm lesion in the left lobe of the liver is new from prior imaging including previous contrasted CT dating back to December 10, 2019, with the lesion appearing to equilibrate with background liver on delayed imaging sequence but is incompletely evaluated on this imaging study and technically nonspecific possibly reflecting a benign perfusional variant and while its appearance is not typical for that of a melanoma metastasis, it is not excluded on this examination. Suggest more definitive characterization by hepatic protocol MRI with and without contrast. 2. Stable postoperative changes in the left groin without evidence of local recurrent disease.3. No evidence of metastatic disease in the chest or pelvis.4.  Aortic Atherosclerosis (ICD10-I70.0).      09/23/2021 Imaging   Contrast-enhanced liver ultrasound Mildly hypoenhancing 2.2 cm mass in the posterior aspect of the left lobe of the liver with washout characteristics concerning for non hepatocellular malignancy, concerning for melanotic metastasis given history. The lesion is in an unfavorable location for percutaneous biopsy. Consider PET-CT for further characterization   10/20/2021 Imaging   CT chest abdomen pelvis showed stable postoperative/radiation appearance of the left groin.  No evidence of local recurrence/metastatic disease within the chest abdomen/pelvis.  Fatty liver disease.  Diverticulosis without evidence of typhlitis.  Aortic atherosclerosis   10/21/2021 -  Chemotherapy   Patient is on Treatment Plan : MELANOMA Nivolumab (1) + Ipilimumab (3) q21d / Nivolumab (240) q14d     10/21/2021 - 11/04/2021 Chemotherapy   Patient is on Treatment Plan : MELANOMA Nivolumab + Ipilimumab (1/3) q21d / Nivolumab q14d     11/06/2021 - 11/06/2021 Chemotherapy   Patient is on Treatment Plan : MELANOMA Nivolumab q14d     11/10/2021 Imaging   MRI Brain w wo contrast Negative for metastatic disease to the brain    11/10/2021 Imaging   MRI abdomen w wo contrast 1. Lesion of the posterior superior left lobe of the liver, hepatic segment II, measuring 1.9 x 1.8 cm corresponding to findings of prior imaging. Evaluation is somewhat limited breath motion artifact however there is subtle associated rim enhancement of this lesion. Findings are most in keeping with a hepatic metastasis in the setting of known recurrent melanoma. 2. Mild hepatic steatosis. 3. Cardiomegaly.     04/18/2021, patient establish care with neurology Dr. Mickeal Skinner for intermittent altered cognition.  he was recommended to start Vimpat.    INTERVAL HISTORY Edward Pitts is a 61 y.o. male who has above history reviewed by me today presents for follow up visit for management of inguinal nodal recurrence of melanoma, new liver lesion.  He reports no new complaints. Denies diarrhea, skin rash, abdominal pain.Spells are less common after his bypass surgery   :Review of Systems  Constitutional:  Negative for appetite change, chills, fatigue, fever and unexpected weight change.  HENT:   Negative for hearing loss and voice change.   Eyes:  Negative for eye problems and icterus.  Respiratory:  Negative for chest tightness, cough and shortness of breath.   Cardiovascular:  Positive for leg swelling. Negative for chest pain.  Gastrointestinal:  Negative for abdominal distention and abdominal pain.  Endocrine: Negative for hot flashes.  Genitourinary:  Negative for difficulty urinating, dysuria and frequency.   Musculoskeletal:        Status post left  hip replacement  Skin:  Negative for itching and rash.       Skin hypo-pigmentation on upper extremities, no change  Neurological:  Negative for light-headedness and numbness.       Spells  Hematological:  Negative for adenopathy. Does not bruise/bleed easily.  Psychiatric/Behavioral:  Negative for confusion.     MEDICAL HISTORY:  Past Medical History:  Diagnosis Date   Anemia    iron  treatments   Anxiety    Aortic atherosclerosis (HCC)    Arthritis    Cancer of groin ([place]) 2021   left groin, resected, radiation   Cataract    Complication of anesthesia    PONV   Coronary artery disease    Dizziness of unknown etiology    has led to seizures and passing out.   Family history of adverse reaction to anesthesia    PONV mother   GERD (gastroesophageal reflux disease)    History of complete heart block    PPM placed   Hyperlipidemia    Hypertension    LBBB (left bundle branch block)    Lymphedema of left leg    uses thigh high compression stockings   Melanoma ([place]) 2012   skin cancer, left thigh   OSA on CPAP    PONV (postoperative nausea and vomiting) 04/16/2019   Port-A-Cath in place    RIGHT chest wall   Presence of cardiac pacemaker    [employer]   Seizures ([place])    still has episodes of dizziness. last event 1 month ago (march 2022) and will pass out. takes clonazepam    SURGICAL HISTORY: Past Surgical History:  Procedure Laterality Date   CORONARY ARTERY BYPASS GRAFT N/A 08/10/2021   Procedure: CORONARY ARTERY BYPASS GRAFTING (CABG) X 3 USING LEFT INTERNAL MAMMARY ARTERY AND RIGHT GREATER SAPHENOUS VEIN;  Surgeon: Dahlia Byes, MD;  Location: [place];  Service: Open Heart Surgery;  Laterality: N/A;   CT RADIATION THERAPY GUIDE     left groin   dental implant     permanent implant   ENDOVEIN HARVEST OF GREATER SAPHENOUS VEIN Right 08/10/2021   Procedure: ENDOVEIN HARVEST OF GREATER SAPHENOUS VEIN;  Surgeon: Dahlia Byes, MD;  Location: [place];  Service: Open Heart Surgery;  Laterality: Right;   KNEE SURGERY Left    arthroscopy   LEFT HEART CATH AND CORONARY ANGIOGRAPHY Left 06/29/2017   Procedure: LEFT HEART CATH AND CORONARY ANGIOGRAPHY;  Surgeon: [name], MD;  Location: [place] [facility];  Service: Cardiovascular;  Laterality: Left;   LEFT HEART CATH AND CORONARY ANGIOGRAPHY N/A 08/08/2021   Procedure: LEFT HEART CATH AND CORONARY  ANGIOGRAPHY;  Surgeon: [name], MD;  Location: [place] [facility];  Service: Cardiovascular;  Laterality: N/A;   LYMPH NODE DISSECTION Left 04/16/2019   Procedure: Left inguinal Lymph Node Dissection;  Surgeon: [name], MD;  Location: [place];  Service: General;  Laterality: Left;   MELANOMA EXCISION Left 04/16/2019   Procedure: MELANOMA EXCISION LEFT GROIN MASS;  Surgeon: [name], MD;  Location: [place];  Service: General;  Laterality: Left;   MELANOMA EXCISION WITH SENTINEL LYMPH NODE BIOPSY Left 2012   Left calf    PACEMAKER INSERTION N/A 08/26/2018   Procedure: INSERTION PACEMAKER;  Surgeon: [name], MD;  Location: [facility];  Service: Cardiovascular;  Laterality: N/A;   PORTA CATH INSERTION N/A 08/26/2019   Procedure: PORTA CATH INSERTION;  Surgeon: Katha Cabal, MD;  Location: [place] [facility];  Service: Cardiovascular;  Laterality: N/A;   SUPERFICIAL LYMPH NODE BIOPSY / EXCISION Left 2020   lymph nodes removed around left groin melanoma site   TEE WITHOUT CARDIOVERSION N/A 08/10/2021   Procedure: TRANSESOPHAGEAL ECHOCARDIOGRAM (TEE);  Surgeon: Dahlia Byes, MD;  Location: [place];  Service: Open Heart Surgery;  Laterality: N/A;   TEMPORARY PACEMAKER N/A 08/25/2018   Procedure: TEMPORARY PACEMAKER;  Surgeon: [name], MD;  Location: [place] [facility];  Service: Cardiovascular;  Laterality: N/A;   TOTAL HIP ARTHROPLASTY Left 07/14/2020   Procedure: TOTAL HIP ARTHROPLASTY;  Surgeon: Dereck Leep, MD;  Location: [facility];  Service: Orthopedics;  Laterality: Left;    SOCIAL HISTORY: Social History   Socioeconomic History   Marital status: Married    Spouse name: Vicente Males    Number of children: 7   Years of education: 12   Highest education level: Not on file  Occupational History    Comment: disability  Tobacco Use   Smoking status: Never   Smokeless tobacco: Never  Vaping Use   Vaping Use: Never used  Substance and Sexual Activity    Alcohol use: No   Drug use: No   Sexual activity: Not Currently  Other Topics Concern   Not on file  Social History Narrative   Lives with  Wife,   Has 2 small dogs   Caffeine use: sodas (2 per day)      Out of work on disability.  Has a walk in shower. No stairs to climb   Oncology treatment ongoing. Uses port a cath for treatment.      pacemaker   Social Determinants of Health   Financial Resource Strain: Low Risk  (02/12/2019)   Overall Financial Resource Strain (CARDIA)    Difficulty of Paying Living Expenses: Not hard at all  Food Insecurity: No Food Insecurity (02/12/2019)   Hunger Vital Sign    Worried About Running Out of Food in the Last Year: Never true    Ran Out of Food in the Last Year: Never true  Transportation Needs: Unmet Transportation Needs (02/12/2019)   PRAPARE - Hydrologist (Medical): Yes    Lack of Transportation (Non-Medical): Yes  Physical Activity: Unknown (02/12/2019)   Exercise Vital Sign    Days of Exercise per Week: 0 days    Minutes of Exercise per Session: Not on file  Stress: No Stress Concern Present (02/12/2019)   [place]    Feeling of Stress : Only a little  Social Connections: Unknown (02/12/2019)   Social Connection and Isolation Panel [NHANES]    Frequency of Communication with Friends and Family: More than three times a week    Frequency of Social Gatherings with Friends and Family: Not on file    Attends Religious Services: Not on file    Active Member of Clubs or Organizations: Not on file    Attends Archivist Meetings: Not on file    Marital Status: Married  Intimate Partner Violence: Not At Risk (02/12/2019)   Humiliation, Afraid, Rape, and Kick questionnaire    Fear of Current or Ex-Partner: No    Emotionally Abused: No    Physically Abused: No    Sexually Abused: No    FAMILY HISTORY: Family History  Problem  Relation Age of Onset   Cancer Paternal Grandmother     ALLERGIES:  is allergic to ibuprofen, levetiracetam, and nsaids.  MEDICATIONS:  Current Outpatient Medications  Medication Sig  Dispense Refill   aspirin 81 MG EC tablet Take 81 mg by mouth daily.     atorvastatin (LIPITOR) 20 MG tablet Take 20 mg by mouth daily.     Carboxymeth-Glyc-Polysorb PF (REFRESH OPTIVE MEGA-3) 0.5-1-0.5 % SOLN Place 1 drop into both eyes daily as needed (dry eyes).     clonazePAM (KLONOPIN) 0.5 MG tablet Take 0.$RemoveBefor'5mg'JPbWqdPipiEk$  in AM, $Remo'1mg'GiWyY$  in PM 90 tablet 0   febuxostat (ULORIC) 40 MG tablet Take 40 mg by mouth daily.     ferrous sulfate 325 (65 FE) MG EC tablet Take 1 tablet (325 mg total) by mouth 2 (two) times daily with a meal. 60 tablet 3   hydrochlorothiazide (HYDRODIURIL) 25 MG tablet      hydrocortisone 2.5 % ointment Apply 1 application. topically daily as needed (itching).     Lacosamide (VIMPAT) 100 MG TABS TAKE 1 TABLET (100 MG TOTAL) BY MOUTH IN THE MORNING AND AT BEDTIME. 60 tablet 3   lidocaine-prilocaine (EMLA) cream Apply 1 application. topically as needed. Apply small amount of cream to port site approx 1-2 hours prior to appointment. 30 g 11   lisinopril (ZESTRIL) 20 MG tablet Take 20 mg by mouth daily.     LORazepam (ATIVAN) 2 MG tablet Take 1 tablet (2 mg total) by mouth every 8 (eight) hours as needed for seizure. 20 tablet 0   metoprolol succinate (TOPROL-XL) 25 MG 24 hr tablet Take 25 mg by mouth daily.     Multiple Vitamin (MULTIVITAMIN WITH MINERALS) TABS tablet Take 1 tablet by mouth daily. Centrum Silver     omeprazole (PRILOSEC) 20 MG capsule TAKE 1 CAPSULE BY MOUTH EVERY DAY 90 capsule 0   ondansetron (ZOFRAN) 4 MG tablet TAKE 1 TABLET BY MOUTH EVERY 8 HOURS AS NEEDED FOR NAUSEA AND VOMITING 90 tablet 1   HYDROcodone-acetaminophen (NORCO/VICODIN) 5-325 MG tablet  (Patient not taking: Reported on 11/18/2021)     No current facility-administered medications for this visit.     PHYSICAL  EXAMINATION: ECOG PERFORMANCE STATUS: 1 - Symptomatic but completely ambulatory Vitals:   11/18/21 0851  BP: (!) 132/47  Pulse: 78  Resp: 19  Temp: 98.7 F (37.1 C)  SpO2: 98%   Filed Weights   11/18/21 0851  Weight: 239 lb 9.6 oz (108.7 kg)    Physical Exam Constitutional:      General: He is not in acute distress.    Comments: Patient ambulates independently  HENT:     Head: Normocephalic and atraumatic.  Eyes:     General: No scleral icterus.    Pupils: Pupils are equal, round, and reactive to light.  Cardiovascular:     Rate and Rhythm: Normal rate and regular rhythm.     Heart sounds: Normal heart sounds.  Pulmonary:     Effort: Pulmonary effort is normal. No respiratory distress.     Breath sounds: No wheezing.  Abdominal:     General: Bowel sounds are normal. There is no distension.     Palpations: Abdomen is soft. There is no mass.     Tenderness: There is no abdominal tenderness.     Comments:    Musculoskeletal:        General: No deformity. Normal range of motion.     Cervical back: Normal range of motion and neck supple.     Comments: Left lower extremity edema-chronic   Skin:    General: Skin is warm and dry.  Neurological:     Mental Status: He is alert  and oriented to person, place, and time. Mental status is at baseline.     Cranial Nerves: No cranial nerve deficit.     Coordination: Coordination normal.  Psychiatric:        Mood and Affect: Mood normal.     LABORATORY DATA:  I have reviewed the data as listed    Latest Ref Rng & Units 11/18/2021    8:37 AM 11/04/2021    9:06 AM 10/21/2021    8:38 AM  CBC  WBC 4.0 - 10.5 K/uL 5.0  6.3  4.7   Hemoglobin 13.0 - 17.0 g/dL 11.2  10.4  10.7   Hematocrit 39.0 - 52.0 % 34.2  31.5  32.9   Platelets 150 - 400 K/uL 205  203  204       Latest Ref Rng & Units 11/04/2021    9:06 AM 10/21/2021    8:38 AM 09/15/2021    8:27 AM  CMP  Glucose 70 - 99 mg/dL 125  149  137   BUN 8 - 23 mg/dL 48  29  27    Creatinine 0.61 - 1.24 mg/dL 2.07  1.50  1.68   Sodium 135 - 145 mmol/L 139  139  140   Potassium 3.5 - 5.1 mmol/L 4.1  3.9  4.0   Chloride 98 - 111 mmol/L 109  111  108   CO2 22 - 32 mmol/L $RemoveB'20  22  23   'ziwLalrk$ Calcium 8.9 - 10.3 mg/dL 8.9  9.1  9.1   Total Protein 6.5 - 8.1 g/dL 7.3  7.4  7.5   Total Bilirubin 0.3 - 1.2 mg/dL 0.2  0.7  0.5   Alkaline Phos 38 - 126 U/L 89  91  105   AST 15 - 41 U/L $Remo'21  26  23   'tOePU$ ALT 0 - 44 U/L $Remo'24  31  30      'GhbFN$ RADIOGRAPHIC STUDIES: I have personally reviewed the radiological images as listed and agreed with the findings in the report. MR Brain W Wo Contrast  Result Date: 11/12/2021 CLINICAL DATA:  Melanoma staging EXAM: MRI HEAD WITHOUT AND WITH CONTRAST TECHNIQUE: Multiplanar, multiecho pulse sequences of the brain and surrounding structures were obtained without and with intravenous contrast. CONTRAST:  83mL GADAVIST GADOBUTROL 1 MMOL/ML IV SOLN COMPARISON:  10/06/2021 PET-CT FINDINGS: Brain: No swelling or enhancement to suggest metastatic disease. No central infarct, hemorrhage, hydrocephalus, or collection. Vascular: Normal flow voids and vascular enhancements. Skull and upper cervical spine: Negative for marrow lesion. Sinuses/Orbits: Negative. Other: The left parotid mass by PET CT is not well assessed on this brain MRI. IMPRESSION: Negative for metastatic disease to the brain. Electronically Signed   By: Jorje Guild M.D.   On: 11/12/2021 19:00   MR Abdomen W Wo Contrast  Result Date: 11/12/2021 CLINICAL DATA:  Malignant melanoma, left inguinal nodal recurrence, evaluate suspicious and FDG avid left lobe liver lesion EXAM: MRI ABDOMEN WITHOUT AND WITH CONTRAST TECHNIQUE: Multiplanar multisequence MR imaging of the abdomen was performed both before and after the administration of intravenous contrast. CONTRAST:  16mL GADAVIST GADOBUTROL 1 MMOL/ML IV SOLN COMPARISON:  PET-CT, 10/06/2021, CT chest abdomen pelvis, 09/15/2021, contrast enhanced ultrasound,  09/23/2021 FINDINGS: Lower chest: No acute abnormality.  Cardiomegaly. Hepatobiliary: Mild hepatic steatosis. Subtly T2 hyperintense lesion of the posterior superior left lobe of the liver, hepatic segment II, measuring 1.9 x 1.8 cm (series 5, image 15). Evaluation is somewhat limited in this vicinity, particularly on contrast enhanced sequences, however there  is subtle associated rim enhancement of this lesion (e.g. Series 16, image 37). Simple, benign fluid signal cysts of the right lobe of the liver measuring up to 0.7 cm (series 5, image 21). No gallstones, gallbladder wall thickening, or biliary dilatation. Pancreas: Unremarkable. No pancreatic ductal dilatation or surrounding inflammatory changes. Spleen: Normal in size without significant abnormality. Adrenals/Urinary Tract: Adrenal glands are unremarkable. Simple, benign bilateral fluid signal renal cortical cysts for which no further follow-up or characterization is required. Kidneys are otherwise normal, without renal calculi, solid lesion, or hydronephrosis. Stomach/Bowel: Stomach is within normal limits. No evidence of bowel wall thickening, distention, or inflammatory changes. Vascular/Lymphatic: No significant vascular findings are present. No enlarged abdominal lymph nodes. Other: No abdominal wall hernia or abnormality. No ascites. Musculoskeletal: No acute or significant osseous findings. IMPRESSION: 1. Lesion of the posterior superior left lobe of the liver, hepatic segment II, measuring 1.9 x 1.8 cm corresponding to findings of prior imaging. Evaluation is somewhat limited breath motion artifact however there is subtle associated rim enhancement of this lesion. Findings are most in keeping with a hepatic metastasis in the setting of known recurrent melanoma. 2. Mild hepatic steatosis. 3. Cardiomegaly. Electronically Signed   By: Delanna Ahmadi M.D.   On: 11/12/2021 13:29   NM PET Image Restage (PS) Whole Body  Result Date: 10/08/2021 CLINICAL  DATA:  Subsequent treatment strategy for metastatic melanoma with left inguinal nodal recurrence. New liver lesion. EXAM: NUCLEAR MEDICINE PET WHOLE BODY TECHNIQUE: 13.2 mCi F-18 FDG was injected intravenously. Full-ring PET imaging was performed from the head to foot after the radiotracer. CT data was obtained and used for attenuation correction and anatomic localization. Fasting blood glucose: 106 mg/dl COMPARISON:  PET-CT 02/11/2019. CT of the chest, abdomen and pelvis 09/15/2021. FINDINGS: Mediastinal blood pool activity: SUV max 3.2 HEAD/ NECK: There is a small hypermetabolic nodule posteriorly in the superficial portion of the left parotid gland (SUV max 3.4). This appears similar to the previous PET-CT and is likely an incidental parotid lesion. No other hypermetabolic cervical lymph nodes are seen.There are no lesions of the pharyngeal mucosal space. Incidental CT findings: none CHEST: There are no hypermetabolic mediastinal, hilar or axillary lymph nodes. No hypermetabolic pulmonary activity or suspicious nodularity. Incidental CT findings: There is hypermetabolic activity within the median sternotomy, likely physiologic. The median sternotomy has incompletely healed, but demonstrates no dehiscence. Right IJ Port-A-Cath and left subclavian pacemaker leads are in place. ABDOMEN/PELVIS: There is focal hypermetabolic activity in the area of concern within the left hepatic lobe. The new lesion seen on CT is not well demonstrated on the CT images, but there is focal hypermetabolic activity in this area (SUV max 4.5). No other hypermetabolic liver lesions are seen. There is no hypermetabolic activity within the adrenal glands, pancreas or spleen. There is no hypermetabolic nodal activity. Previously demonstrated large left nodal mass has been surgically removed, and no residual hypermetabolic activity is present in this area. Incidental CT findings: Stable umbilical hernia containing only fat, colonic  diverticulosis and aortic atherosclerosis. SKELETON: There is no hypermetabolic activity to suggest osseous metastatic disease. Incidental CT findings: none EXTREMITIES: There is low-level hypermetabolic activity within the subcutaneous fat medial to the left knee, corresponding with surgical clips and attributed to previous saphenous venous harvesting for CABG. Correlate clinically. No other suspicious soft tissue activity within the extremities. Incidental CT findings: none IMPRESSION: 1. The new lesion of concern in the left hepatic lobe is not optimally evaluated based on small size, but does  demonstrate hypermetabolic activity, suspicious for metastatic disease. Assuming the patient is unable to undergo MRI due to his pacemaker, recommend attention on follow-up CT in 3 months. 2. No other evidence of metastatic disease. 3. 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 Electronically Signed   By: Richardean Sale M.D.   On: 10/08/2021 15:01   US LIVER W/CM 1ST LESION  Result Date: 09/23/2021 CLINICAL DATA:  61 year old male with history of melanoma with development of new left lobe lesion measuring up to 9 mm on recent staging CT. The patient presents for contrast enhanced ultrasound for further characterization. EXAM: US LIVER WITH CONTRAST CONTRAST:  2.4 mL sulfur hexafluoride Number of injections: 1 COMPARISON:  09/15/2021 FINDINGS: LIVER Size: Normal. Echogenicity: Within normal limits. Surface Contour: Smooth. Observation 1 Lobe: Left Size: 2.2 cm Grayscale features: Hypoechoic with isoechoic center. Arterial phase enhancement features: Mildly hypoenhancing Onset of washout: 60 seconds Degree of washout: Marked Tumor in vein: No CEUS LI-RADS category: Not applicable in the absence of cirrhosis. IMPRESSION: Mildly hypoenhancing 2.2 cm mass in the posterior aspect of the left lobe of the liver with washout characteristics concerning for non  hepatocellular malignancy, concerning for melanotic metastasis given history. The lesion is in an unfavorable location for percutaneous biopsy. Consider PET-CT for further characterization. Ruthann Cancer, MD [facility] Electronically Signed   By: Ruthann Cancer M.D.   On: 09/23/2021 15:39   CT CHEST ABDOMEN PELVIS W CONTRAST  Result Date: 09/15/2021 CLINICAL DATA:  History of melanoma, follow-up. * Tracking Code: BO * EXAM: CT CHEST, ABDOMEN, AND PELVIS WITH CONTRAST TECHNIQUE: Multidetector CT imaging of the chest, abdomen and pelvis was performed following the standard protocol during bolus administration of intravenous contrast. RADIATION DOSE REDUCTION: This exam was performed according to the departmental dose-optimization program which includes automated exposure control, adjustment of the mA and/or kV according to patient size and/or use of iterative reconstruction technique. CONTRAST:  21mL OMNIPAQUE IOHEXOL 300 MG/ML  SOLN COMPARISON:  Multiple priors including most recent CT April 26, 2021 FINDINGS: CT CHEST FINDINGS Cardiovascular: Accessed right chest Port-A-Cath with tip near the superior cavoatrial junction. Left chest cardiac conduction device with leads in the right atrium and right ventricle. Interval median sternotomy and CABG. Aortic atherosclerosis without aneurysmal dilation. No central pulmonary embolus on this nondedicated study. Normal size heart. Trace pericardial effusion there are postoperative. Mediastinum/Nodes: Mild stranding in the anterior mediastinum most likely reflect sequela of interval CABG. Hypodense 15 mm nodule in the right lobe of the thyroid on image 6/2. No pathologically enlarged mediastinal, hilar or axillary lymph nodes. Esophagus is grossly unremarkable. Lungs/Pleura: No suspicious pulmonary nodules or masses. No focal airspace consolidation. No pleural effusion. No pneumothorax. Musculoskeletal: No  aggressive lytic or blastic lesion of bone. Median sternotomy wires are intact. Anterior chest wall incision. Bilateral gynecomastia. CT ABDOMEN PELVIS FINDINGS Hepatobiliary: Subtle 9 mm hypodense lesion in the left lobe of the liver on image 55/2 is new from prior imaging including contrasted CTs dating back to December 10, 2019, with the lesion appearing to equilibrate to background liver on delayed imaging. Similar tiny low-density lesion in the right lobe of the liver are too small to accurately characterize but stable over multiple priors and favored to reflect a cyst. No solid enhancing hepatic lesion. Gallbladder is unremarkable. No biliary ductal dilation. Pancreas: No pancreatic ductal dilation or evidence of acute inflammation. Spleen: No splenomegaly or focal splenic lesion. Adrenals/Urinary Tract: Bilateral  adrenal glands appear normal. No hydronephrosis. Hypodense 2 cm right renal cyst is considered benign requiring no independent imaging follow-up. Urinary bladder is unremarkable for degree of distension within limitation of streak artifact from left hip arthroplasty. Stomach/Bowel: Radiopaque enteric contrast material traverses the splenic flexure. Stomach is unremarkable for degree of distension. No pathologic dilation of small or large bowel. Left-sided colonic diverticulosis without findings of acute diverticulitis. No evidence of acute bowel inflammation. Vascular/Lymphatic: Aortic atherosclerosis without abdominal aortic aneurysm. No pathologically enlarged abdominal or pelvic lymph nodes. Reproductive: Prostate is unremarkable. Other: Similar postsurgical changes in the left groin without new enhancing soft tissue in the surgical bed. Moderate-sized fat containing ventral hernia. Musculoskeletal: Prior left total hip arthroplasty. No aggressive lytic or blastic lesion of bone. IMPRESSION: 1. Subtle hypodense 9 mm lesion in the left lobe of the liver is new from prior imaging including previous  contrasted CT dating back to December 10, 2019, with the lesion appearing to equilibrate with background liver on delayed imaging sequence but is incompletely evaluated on this imaging study and technically nonspecific possibly reflecting a benign perfusional variant and while its appearance is not typical for that of a melanoma metastasis, it is not excluded on this examination. Suggest more definitive characterization by hepatic protocol MRI with and without contrast. 2. Stable postoperative changes in the left groin without evidence of local recurrent disease. 3. No evidence of metastatic disease in the chest or pelvis. 4.  Aortic Atherosclerosis (ICD10-I70.0). Electronically Signed   By: Dahlia Bailiff M.D.   On: 09/15/2021 16:33   DG Chest 2 View  Result Date: 09/14/2021 CLINICAL DATA:  Status post coronary artery bypass grafts EXAM: CHEST - 2 VIEW COMPARISON:  08/14/2021 FINDINGS: Transverse diameter of heart is slightly increased. There is previous coronary artery bypass surgery. Pacemaker battery is seen in the left infraclavicular region with tips of leads in right atrium and right ventricle. Tip of right IJ chest port is seen in the superior vena cava. IMPRESSION: There are no new infiltrates or signs of pulmonary edema. Electronically Signed   By: Elmer Picker M.D.   On: 09/14/2021 13:49

## 2021-11-18 NOTE — Assessment & Plan Note (Signed)
Left inguinal nodal recurrence of melanoma.  Status post resection and adjuvant radiation. S/p 2 years of Nivolumab maintenance.  Recent CT, enhanced [place], PET, and MRI  findings showed small liver lesion which is not amendable for biopsy, however suspicious for metastatic disease.  Labs are reviewed and discussed with patient. Proceed with Nivolumab today Short term imaging follow up

## 2021-11-18 NOTE — Progress Notes (Signed)
Daily Session Note  Patient Details  Name: Edward Pitts MRN: [id number] Date of Birth: Sep 06, 1960 Referring Provider:   Flowsheet Row Cardiac Rehab from 09/19/2021 in [facility] Cardiac and Pulmonary Rehab  Referring Provider Serafina Royals MD       Encounter Date: 11/18/2021  Check In:  Session Check In - 11/18/21 5374       Check-In   Supervising physician immediately available to respond to emergencies See telemetry face sheet for immediately available ER MD    Location [facility]-Cardiac & Pulmonary Rehab    Staff Present Heath Lark, RN, BSN, CCRP;Joseph [place], RCP,RRT,BSRT;Jessica [place], [place], [place], CCRP, CCET    Virtual Visit No    Medication changes reported     No    Fall or balance concerns reported    Yes    Warm-up and Cool-down Performed on first and last piece of equipment    Resistance Training Performed Yes    VAD Patient? No    PAD/SET Patient? No      Pain Assessment   Currently in Pain? No/denies                Social History   Tobacco Use  Smoking Status Never  Smokeless Tobacco Never    Goals Met:  Independence with exercise equipment Exercise tolerated well No report of concerns or symptoms today  Goals Unmet:  Not Applicable  Comments: Pt able to follow exercise prescription today without complaint.  Will continue to monitor for progression.    Dr. Emily Filbert is Medical Director for [facility].  Dr. Ottie Glazier is Medical Director for [facility] Pulmonary Rehabilitation.

## 2021-11-19 ENCOUNTER — Encounter: Payer: Self-pay | Admitting: Oncology

## 2021-11-19 NOTE — Assessment & Plan Note (Signed)
Iron panel showed iron deficiency.  Recommend ferrous sulfate '325mg'$  BID.  Continue monitor.

## 2021-11-21 ENCOUNTER — Encounter: Payer: Medicare Other | Admitting: *Deleted

## 2021-11-21 VITALS — Ht 68.5 in | Wt 237.5 lb

## 2021-11-21 DIAGNOSIS — Z951 Presence of aortocoronary bypass graft: Secondary | ICD-10-CM

## 2021-11-21 NOTE — Progress Notes (Signed)
Daily Session Note  Patient Details  Name: Edward Pitts MRN: [id number] Date of Birth: February 29, 1961 Referring Provider:   Flowsheet Row Cardiac Rehab from 09/19/2021 in [facility] Cardiac and Pulmonary Rehab  Referring Provider Serafina Royals MD       Encounter Date: 11/21/2021  Check In:  Session Check In - 11/21/21 0835       Check-In   Supervising physician immediately available to respond to emergencies See telemetry face sheet for immediately available ER MD    Location [facility]-Cardiac & Pulmonary Rehab    Staff Present Heath Lark, RN, BSN, CCRP;Joseph [place], RCP,RRT,BSRT;Kelly [place], [place], ACSM CEP, Exercise Physiologist    Virtual Visit No    Medication changes reported     No    Fall or balance concerns reported    No    Warm-up and Cool-down Performed on first and last piece of equipment    Resistance Training Performed Yes    VAD Patient? No    PAD/SET Patient? No      Pain Assessment   Currently in Pain? No/denies                Social History   Tobacco Use  Smoking Status Never  Smokeless Tobacco Never    Goals Met:  Independence with exercise equipment Exercise tolerated well No report of concerns or symptoms today  Goals Unmet:  Not Applicable  Comments: Pt able to follow exercise prescription today without complaint.  Will continue to monitor for progression.    Dr. Emily Filbert is Medical Director for [place].  Dr. Ottie Glazier is Medical Director for [facility] Pulmonary Rehabilitation.

## 2021-11-21 NOTE — Patient Instructions (Addendum)
Discharge Patient Instructions  Patient Details  Name: Edward Pitts MRN: [id number] Date of Birth: 1960/07/14 Referring Provider:  [name], MD   Number of Visits: 36  Reason for Discharge:  Patient reached a stable level of exercise. Patient independent in their exercise. Patient has met program and personal goals.  Smoking History:  Social History   Tobacco Use  Smoking Status Never  Smokeless Tobacco Never    Diagnosis:  S/P CABG x 3  Initial Exercise Prescription:  Initial Exercise Prescription - 09/19/21 1100       Date of Initial Exercise RX and Referring Provider   Date 09/19/21    Referring Provider Serafina Royals MD      Oxygen   Maintain Oxygen Saturation 88% or higher      Treadmill   MPH 1.9    Grade 0    Minutes 15    METs 2.45      Recumbant Bike   Level 1    RPM 50    Watts 15    Minutes 15    METs 2.5      NuStep   Level 1    SPM 80    Minutes 15    METs 2.5      Track   Laps 26    Minutes 15    METs 2.41      Prescription Details   Frequency (times per week) 3    Duration Progress to 30 minutes of continuous aerobic without signs/symptoms of physical distress      Intensity   THRR 40-80% of Max Heartrate 100-139    Ratings of Perceived Exertion 11-13    Perceived Dyspnea 0-4      Progression   Progression Continue to progress workloads to maintain intensity without signs/symptoms of physical distress.      Resistance Training   Training Prescription Yes    Weight 4 lb    Reps 10-15             Discharge Exercise Prescription (Final Exercise Prescription Changes):  Exercise Prescription Changes - 11/14/21 0800       Response to Exercise   Blood Pressure (Admit) 102/60    Blood Pressure (Exit) 110/62    Heart Rate (Admit) 62 bpm    Heart Rate (Exercise) 112 bpm    Heart Rate (Exit) 72 bpm    Rating of Perceived Exertion (Exercise) 15    Symptoms none    Duration Continue with 30 min of aerobic  exercise without signs/symptoms of physical distress.    Intensity THRR unchanged      Progression   Progression Continue to progress workloads to maintain intensity without signs/symptoms of physical distress.    Average METs 3.38      Resistance Training   Training Prescription Yes    Weight 5 lb    Reps 10-15      Interval Training   Interval Training No      Treadmill   MPH 2    Grade 0    Minutes 15    METs 2.53      NuStep   Level 8    Minutes 15    METs 4.7      Track   Laps 30    Minutes 15    METs 2.63      Home Exercise Plan   Plans to continue exercise at Home (comment)   walking, staff videos   Frequency Add 2 additional days to  program exercise sessions.    Initial Home Exercises Provided 10/19/21      Oxygen   Maintain Oxygen Saturation 88% or higher             Functional Capacity:  6 Minute Walk     Row Name 09/19/21 1143 11/21/21 0816       6 Minute Walk   Phase Initial Discharge    Distance 1042 feet 1345 feet    Distance % Change -- 29.1 %    Distance Feet Change -- 303 ft    Walk Time 6 minutes 6 minutes    # of Rest Breaks 0 0    MPH 1.97 2.55    METS 2.43 3.13    RPE 9 11    Perceived Dyspnea  1 --    VO2 Peak 8.51 10.97    Symptoms Yes (comment) No    Comments SOB --    Resting HR 61 bpm 63 bpm    Resting BP 128/74 102/62    Resting Oxygen Saturation  95 % 95 %    Exercise Oxygen Saturation  during 6 min walk 96 % 96 %    Max Ex. HR 106 bpm 107 bpm    Max Ex. BP 126/74 146/74    2 Minute Post BP 124/62 --           Nutrition & Weight - Outcomes:  Pre Biometrics - 09/19/21 1158       Pre Biometrics   Height 5' 8.5" (1.74 m)    Weight 238 lb 3.2 oz (108 kg)    BMI (Calculated) 35.69    Single Leg Stand 0.9 seconds             Post Biometrics - 11/21/21 0817        Post  Biometrics   Height 5' 8.5" (1.74 m)    Weight 237 lb 8 oz (107.7 kg)    BMI (Calculated) 35.58    Single Leg Stand 7.9 seconds              Nutrition:  Nutrition Therapy & Goals - 11/07/21 0846       Nutrition Therapy   Diet Heart healthy, low Na    Drug/Food Interactions Statins/Certain Fruits    Protein (specify units) 80g    Fiber 30 grams    Whole Grain Foods 3 servings    Saturated Fats 16 max. grams    Fruits and Vegetables 8 servings/day    Sodium 2 grams      Personal Nutrition Goals   Nutrition Goal ST: include 2 snacks during the day with fiber, protein, and fat LT: meet nutritional needs with variety and added snacks    Comments 61 y.o. M admitted to cardiac rehab s/p CABG x3. PMHx includes recurrent malignant melanoma , cancer in groin s/p radiation and resection, anemia, atherosclerosis, CAD, arthritis, GERD, HLD, HTN, OSA, CKD stg 3. Relevant medications includes lipitor, clonazepam, uloric, ferrous sulfate, hydrochlorothiazide, hydrocodone, lorazepam, MVI, omeprazole, zofran, Nivolumab. PYP Score: 64. Vegetables & Fruits 5/12. Breads, Grains & Cereals 5/12. Red & Processed Meat 9/12. Poultry 2/2. Fish & Shellfish 0/4. Beans, Nuts & Seeds 1/4. Milk & Dairy Foods 5/6. Toppings, Oils, Seasonings & Salt 18/20. Sweets, Snacks & Restaurant Food 13/14. Beverages 6/10. He feels that he is drinking too many sodas 1x/day, but he has cut back a lot; he would like to try crystal lite instead. Whatever him and his wife have in the house to eat;  she will normally cook dinner. S: pack of nab crackers or candy bar D: meat, vegetables: frozen mixed vegetables, broccoli. He started eating just one snack during the day as he thought it would be healthier and he enjoys it. Discussed how eating consistently can help to meet nutritional needs and provide energy during the day; suggested that if he didn't want to include more meals, to inlcude more nutritionally dense snacks such as fruit with nuts/seeds, eggs, 1/2 sandwich, etc. He does not like walnuts or yogurt as well as some fruits and vegetables; discussed how variety of  foods helps to meet nutritional needs and that he doesn't have to like all foods to stay healthy - suggested other options aside from fish and walnuts for omega-3 FAs such as chia seeds, ground flaxseed, and omega-3 fortified eggs. Reviewed heart healthy eating.      Intervention Plan   Intervention Prescribe, educate and counsel regarding individualized specific dietary modifications aiming towards targeted core components such as weight, hypertension, lipid management, diabetes, heart failure and other comorbidities.    Expected Outcomes Short Term Goal: Understand basic principles of dietary content, such as calories, fat, sodium, cholesterol and nutrients.;Short Term Goal: A plan has been developed with personal nutrition goals set during dietitian appointment.;Long Term Goal: Adherence to prescribed nutrition plan.            Goals reviewed with patient; copy given to patient.

## 2021-11-22 ENCOUNTER — Other Ambulatory Visit: Payer: Self-pay | Admitting: Internal Medicine

## 2021-11-22 ENCOUNTER — Other Ambulatory Visit: Payer: Self-pay | Admitting: Oncology

## 2021-11-22 DIAGNOSIS — R569 Unspecified convulsions: Secondary | ICD-10-CM

## 2021-11-23 ENCOUNTER — Encounter: Payer: Medicare Other | Admitting: *Deleted

## 2021-11-23 DIAGNOSIS — Z951 Presence of aortocoronary bypass graft: Secondary | ICD-10-CM

## 2021-11-23 NOTE — Progress Notes (Signed)
Daily Session Note  Patient Details  Name: Edward Pitts MRN: [id number] Date of Birth: Aug 06, 1960 Referring Provider:   Flowsheet Row Cardiac Rehab from 09/19/2021 in [facility] Cardiac and Pulmonary Rehab  Referring Provider Serafina Royals MD       Encounter Date: 11/23/2021  Check In:  Session Check In - 11/23/21 0950       Check-In   Supervising physician immediately available to respond to emergencies See telemetry face sheet for immediately available ER MD    Location [facility]-Cardiac & Pulmonary Rehab    Staff Present Nyoka Cowden, RN, BSN, Fenton Foy, BS, Exercise Physiologist;Joseph [place], RCP,RRT,BSRT;Melissa [place], [place], LDN    Virtual Visit No    Medication changes reported     No    Fall or balance concerns reported    No    Tobacco Cessation No Change    Warm-up and Cool-down Performed on first and last piece of equipment    Resistance Training Performed Yes    VAD Patient? No    PAD/SET Patient? No      Pain Assessment   Currently in Pain? No/denies                Social History   Tobacco Use  Smoking Status Never  Smokeless Tobacco Never    Goals Met:  Independence with exercise equipment Exercise tolerated well No report of concerns or symptoms today  Goals Unmet:  Not Applicable  Comments: Pt able to follow exercise prescription today without complaint.  Will continue to monitor for progression.    Dr. Emily Filbert is Medical Director for [facility].  Dr. Ottie Glazier is Medical Director for [facility] Pulmonary Rehabilitation.

## 2021-11-25 ENCOUNTER — Encounter: Payer: Medicare Other | Attending: Internal Medicine | Admitting: *Deleted

## 2021-11-25 DIAGNOSIS — Z951 Presence of aortocoronary bypass graft: Secondary | ICD-10-CM | POA: Diagnosis not present

## 2021-11-25 DIAGNOSIS — I252 Old myocardial infarction: Secondary | ICD-10-CM | POA: Insufficient documentation

## 2021-11-25 NOTE — Progress Notes (Signed)
Daily Session Note  Patient Details  Name: Edward Pitts MRN: [id number] Date of Birth: 07-23-1960 Referring Provider:   Flowsheet Row Cardiac Rehab from 09/19/2021 in [facility] Cardiac and Pulmonary Rehab  Referring Provider Serafina Royals MD       Encounter Date: 11/25/2021  Check In:  Session Check In - 11/25/21 1150       Check-In   Supervising physician immediately available to respond to emergencies See telemetry face sheet for immediately available ER MD    Location [facility]-Cardiac & Pulmonary Rehab    Staff Present Nyoka Cowden, RN, BSN, Lauretta [place]ill, RCP,RRT,BSRT;Melissa [place], [place], LDN    Virtual Visit No    Medication changes reported     No    Fall or balance concerns reported    No    Tobacco Cessation No Change    Warm-up and Cool-down Performed on first and last piece of equipment    Resistance Training Performed Yes    VAD Patient? No    PAD/SET Patient? No      Pain Assessment   Currently in Pain? No/denies                Social History   Tobacco Use  Smoking Status Never  Smokeless Tobacco Never    Goals Met:  Independence with exercise equipment Exercise tolerated well No report of concerns or symptoms today  Goals Unmet:  Not Applicable  Comments: Pt able to follow exercise prescription today without complaint.  Will continue to monitor for progression.    Dr. Emily Filbert is Medical Director for [facility].  Dr. Ottie Glazier is Medical Director for [facility] Pulmonary Rehabilitation.

## 2021-11-26 ENCOUNTER — Encounter: Payer: Self-pay | Admitting: Oncology

## 2021-11-30 ENCOUNTER — Encounter: Payer: Self-pay | Admitting: *Deleted

## 2021-11-30 ENCOUNTER — Encounter: Payer: Medicare Other | Admitting: *Deleted

## 2021-11-30 DIAGNOSIS — Z951 Presence of aortocoronary bypass graft: Secondary | ICD-10-CM | POA: Diagnosis not present

## 2021-11-30 DIAGNOSIS — I252 Old myocardial infarction: Secondary | ICD-10-CM | POA: Diagnosis not present

## 2021-11-30 NOTE — Progress Notes (Signed)
Daily Session Note  Patient Details  Name: Edward Pitts MRN: [id number] Date of Birth: Oct 14, 1960 Referring Provider:   Flowsheet Row Cardiac Rehab from 09/19/2021 in [facility] Cardiac and Pulmonary Rehab  Referring Provider Serafina Royals MD       Encounter Date: 11/30/2021  Check In:  Session Check In - 11/30/21 0826       Check-In   Supervising physician immediately available to respond to emergencies See telemetry face sheet for immediately available ER MD    Location [facility]-Cardiac & Pulmonary Rehab    Staff Present Heath Lark, RN, BSN, CCRP;Jessica [place], MA, RCEP, CCRP, CCET;Joseph [place], RCP,RRT,BSRT;Noah [place], [place], Exercise Physiologist    Virtual Visit No    Medication changes reported     No    Fall or balance concerns reported    No    Warm-up and Cool-down Performed on first and last piece of equipment    Resistance Training Performed Yes    VAD Patient? No    PAD/SET Patient? No      Pain Assessment   Currently in Pain? No/denies                Social History   Tobacco Use  Smoking Status Never  Smokeless Tobacco Never    Goals Met:  Independence with exercise equipment Exercise tolerated well No report of concerns or symptoms today  Goals Unmet:  Not Applicable  Comments: Pt able to follow exercise prescription today without complaint.  Will continue to monitor for progression.    Dr. Emily Filbert is Medical Director for [place].  Dr. Ottie Glazier is Medical Director for [facility] Pulmonary Rehabilitation.

## 2021-11-30 NOTE — Progress Notes (Signed)
Cardiac Individual Treatment Plan  Patient Details  Name: Chananya Canizalez MRN: [id number] Date of Birth: 13-Dec-1960 Referring Provider:   Flowsheet Row Cardiac Rehab from 09/19/2021 in [facility] Cardiac and Pulmonary Rehab  Referring Provider Serafina Royals MD       Initial Encounter Date:  Flowsheet Row Cardiac Rehab from 09/19/2021 in [facility] Cardiac and Pulmonary Rehab  Date 09/19/21       Visit Diagnosis: S/P CABG x 3  Patient's Home Medications on Admission:  Current Outpatient Medications:    aspirin 81 MG EC tablet, Take 81 mg by mouth daily., Disp: , Rfl:    atorvastatin (LIPITOR) 20 MG tablet, Take 20 mg by mouth daily., Disp: , Rfl:    Carboxymeth-Glyc-Polysorb PF (REFRESH OPTIVE MEGA-3) 0.5-1-0.5 % SOLN, Place 1 drop into both eyes daily as needed (dry eyes)., Disp: , Rfl:    clonazePAM (KLONOPIN) 0.5 MG tablet, TAKE 0.$RemoveBefore'5MG'qDgDCPvaEBhSc$  IN AM, $Remo'1MG'bmGDI$  IN PM, Disp: 90 tablet, Rfl: 0   febuxostat (ULORIC) 40 MG tablet, Take 40 mg by mouth daily., Disp: , Rfl:    ferrous sulfate 325 (65 FE) MG EC tablet, Take 1 tablet (325 mg total) by mouth 2 (two) times daily with a meal., Disp: 60 tablet, Rfl: 3   hydrochlorothiazide (HYDRODIURIL) 25 MG tablet, , Disp: , Rfl:    HYDROcodone-acetaminophen (NORCO/VICODIN) 5-325 MG tablet, , Disp: , Rfl:    hydrocortisone 2.5 % ointment, Apply 1 application. topically daily as needed (itching)., Disp: , Rfl:    Lacosamide (VIMPAT) 100 MG TABS, TAKE 1 TABLET (100 MG TOTAL) BY MOUTH IN THE MORNING AND AT BEDTIME., Disp: 60 tablet, Rfl: 3   lidocaine-prilocaine (EMLA) cream, Apply 1 application. topically as needed. Apply small amount of cream to port site approx 1-2 hours prior to appointment., Disp: 30 g, Rfl: 11   lisinopril (ZESTRIL) 20 MG tablet, Take 20 mg by mouth daily., Disp: , Rfl:    LORazepam (ATIVAN) 2 MG tablet, TAKE 1 TABLET (2 MG TOTAL) BY MOUTH EVERY 8 (EIGHT) HOURS AS NEEDED FOR SEIZURE., Disp: 20 tablet, Rfl: 0   metoprolol succinate (TOPROL-XL) 25 MG  24 hr tablet, Take 25 mg by mouth daily., Disp: , Rfl:    Multiple Vitamin (MULTIVITAMIN WITH MINERALS) TABS tablet, Take 1 tablet by mouth daily. 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   Family history of adverse reaction to anesthesia    PONV mother   GERD (gastroesophageal reflux disease)    History of complete heart block    PPM placed   Hyperlipidemia    Hypertension    LBBB (left bundle branch block)    Lymphedema of left leg    uses thigh high compression stockings   Melanoma ([place]) 2012   skin cancer, left thigh   OSA on  CPAP    PONV (postoperative nausea and vomiting) 04/16/2019   Port-A-Cath in place    RIGHT chest wall   Presence of cardiac pacemaker    [employer]   Seizures (HCC)    still has episodes of dizziness. last event 1 month ago (march 2022) and will pass out. takes clonazepam    Tobacco Use: Social History   Tobacco Use  Smoking Status Never  Smokeless Tobacco Never    Labs: Review Flowsheet  More data exists      Latest Ref Rng & Units 08/06/2021 08/09/2021 08/10/2021 08/11/2021 08/12/2021  Labs for ITP Cardiac and Pulmonary Rehab  Cholestrol 0 - 200 mg/dL 156  - - - -  LDL (calc) 0 - 99 mg/dL 91  - - - -  HDL-C >40 mg/dL 34  - - - -  Trlycerides <150 mg/dL 153  - - - -  Hemoglobin A1c 4.8 - 5.6 % - 5.7  - - -  PH, Arterial 7.35 - 7.45 - 7.39  7.340  7.331  7.292  7.363   7.370  7.370  7.385  7.341  7.370  -  PCO2 arterial 32 - 48 mmHg - 40  40.0  43.8  49.7  41.0  41.4  44.9  41.9  43.3  27.6  -  Bicarbonate 20.0 - 28.0 mmol/L - 24.2  21.6  23.6  24.0  23.3  23.9  26.0  25.3  25.1  23.4  16.0  -  TCO2 22 - 32 mmol/L - - $R'23  25  26  23  25  25  25  25  27  28  27  26  23  23  25  17  'TA$ -  Acid-base deficit 0.0 - 2.0 mmol/L - 0.7  4.0  3.0  3.0  2.0  1.0  2.0  8.0  -  O2 Saturation % - 97.7  98  95  96  100  100  100  70  100  98  91  72.9  58.7      Exercise Target Goals: Exercise Program Goal: Individual exercise prescription set using results from initial 6 min walk test and THRR while considering  patient's activity barriers and safety.   Exercise Prescription Goal: Initial exercise prescription builds to 30-45 minutes a day of aerobic activity, 2-3 days per week.  Home exercise guidelines will be given to patient during program as part of exercise prescription that the participant will acknowledge.   Education: Aerobic Exercise: - Group verbal and visual presentation on the components of exercise prescription. Introduces F.I.T.T principle from ACSM for exercise prescriptions.  Reviews F.I.T.T. principles of aerobic exercise including progression. Written material given at graduation. Flowsheet Row Cardiac Rehab from 10/19/2021 in [facility] Cardiac and Pulmonary Rehab  Education need identified 09/19/21       Education: Resistance Exercise: - Group verbal and visual presentation on the components of exercise prescription. Introduces F.I.T.T principle from ACSM for exercise prescriptions  Reviews F.I.T.T. principles of resistance exercise including progression. Written material given at graduation.    Education: Exercise & Equipment Safety: - Individual verbal instruction and demonstration of equipment use and safety with use of the equipment. Flowsheet Row Cardiac Rehab from 10/19/2021 in [facility] Cardiac and Pulmonary Rehab  Date 09/14/21  Educator [facility]   Instruction Review Code 1- Verbalizes Understanding       Education: Exercise Physiology & General Exercise Guidelines: - Group verbal and written instruction with models to review the exercise physiology  of the cardiovascular system and associated critical values. Provides general exercise guidelines with specific guidelines to those with heart or lung disease.  Flowsheet Row Cardiac Rehab from 10/19/2021 in [facility] Cardiac and Pulmonary Rehab  Date 09/21/21  Educator [facility]  Instruction Review Code 1- [employer] Understanding       Education: Flexibility, Balance, Mind/Body Relaxation: - Group verbal and visual presentation with interactive activity on the components of exercise prescription. Introduces F.I.T.T principle from ACSM for exercise prescriptions. Reviews F.I.T.T. principles of flexibility and balance exercise training including progression. Also discusses the mind body connection.  Reviews various relaxation techniques to help reduce and manage stress (i.e. Deep breathing, progressive muscle relaxation, and visualization). Balance handout provided to take home. Written material given at graduation. Flowsheet Row Cardiac Rehab from 10/12/2021 in [facility] Cardiac and Pulmonary Rehab  Date 10/12/21  Educator [facility]  Instruction Review Code 1- Verbalizes Understanding       Activity Barriers & Risk Stratification:  Activity Barriers & Cardiac Risk Stratification - 09/19/21 1145       Activity Barriers & Cardiac Risk Stratification   Activity Barriers Deconditioning;Muscular Weakness;Shortness of Breath;Balance Concerns;Other (comment)    Comments compression sock on left leg for swelling    Cardiac Risk Stratification High             6 Minute Walk:  6 Minute Walk     Row Name 09/19/21 1143 11/21/21 0816       6 Minute Walk   Phase Initial Discharge    Distance 1042 feet 1345 feet    Distance % Change -- 29.1 %    Distance Feet Change -- 303 ft    Walk Time 6 minutes 6  minutes    # of Rest Breaks 0 0    MPH 1.97 2.55    METS 2.43 3.13    RPE 9 11    Perceived Dyspnea  1 --    VO2 Peak 8.51 10.97    Symptoms Yes (comment) No    Comments SOB --    Resting HR 61 bpm 63 bpm    Resting BP 128/74 102/62    Resting Oxygen Saturation  95 % 95 %    Exercise Oxygen Saturation  during 6 min walk 96 % 96 %    Max Ex. HR 106 bpm 107 bpm    Max Ex. BP 126/74 146/74    2 Minute Post BP 124/62 --             Oxygen Initial Assessment:   Oxygen Re-Evaluation:   Oxygen Discharge (Final Oxygen Re-Evaluation):   Initial Exercise Prescription:  Initial Exercise Prescription - 09/19/21 1100       Date of Initial Exercise RX and Referring Provider   Date 09/19/21    Referring Provider Serafina Royals MD      Oxygen   Maintain Oxygen Saturation 88% or higher      Treadmill   MPH 1.9    Grade 0    Minutes 15    METs 2.45      Recumbant Bike   Level 1    RPM 50    Watts 15    Minutes 15    METs 2.5      NuStep   Level 1    SPM 80    Minutes 15    METs 2.5      Track   Laps 26    Minutes 15    METs 2.41  Prescription Details   Frequency (times per week) 3    Duration Progress to 30 minutes of continuous aerobic without signs/symptoms of physical distress      Intensity   THRR 40-80% of Max Heartrate 100-139    Ratings of Perceived Exertion 11-13    Perceived Dyspnea 0-4      Progression   Progression Continue to progress workloads to maintain intensity without signs/symptoms of physical distress.      Resistance Training   Training Prescription Yes    Weight 4 lb    Reps 10-15             Perform Capillary Blood Glucose checks as needed.  Exercise Prescription Changes:   Exercise Prescription Changes     Row Name 09/19/21 1100 10/03/21 0900 10/17/21 1000 10/19/21 0800 10/31/21 0800     Response to Exercise   Blood Pressure (Admit) 128/74 128/76 126/64 -- 124/68   Blood Pressure (Exercise) 126/74 160/94  126/70 -- --   Blood Pressure (Exit) 124/62 118/62 124/70 -- 108/56   Heart Rate (Admit) 61 bpm 56 bpm 69 bpm -- 63 bpm   Heart Rate (Exercise) 106 bpm 100 bpm 108 bpm -- 112 bpm   Heart Rate (Exit) 76 bpm 72 bpm 71 bpm -- 64 bpm   Oxygen Saturation (Admit) 95 % -- -- -- --   Oxygen Saturation (Exercise) 96 % -- -- -- --   Rating of Perceived Exertion (Exercise) 9 13 15  -- 15   Perceived Dyspnea (Exercise) 1 -- -- -- --   Symptoms SOB none none -- none   Comments walk test results -- -- -- --   Duration -- Continue with 30 min of aerobic exercise without signs/symptoms of physical distress. Continue with 30 min of aerobic exercise without signs/symptoms of physical distress. -- Continue with 30 min of aerobic exercise without signs/symptoms of physical distress.   Intensity -- THRR unchanged THRR unchanged -- THRR unchanged     Progression   Progression -- Continue to progress workloads to maintain intensity without signs/symptoms of physical distress. Continue to progress workloads to maintain intensity without signs/symptoms of physical distress. -- Continue to progress workloads to maintain intensity without signs/symptoms of physical distress.   Average METs -- 2.41 2.91 -- 3.34     Resistance Training   Training Prescription -- Yes Yes -- Yes   Weight -- 4 lb 4 lb -- 5 lb   Reps -- 10-15 10-15 -- 10-15     Interval Training   Interval Training -- No No -- No     Recumbant Bike   Level -- -- 3 -- 3   Watts -- -- 38 -- 38   Minutes -- -- 15 -- 15   METs -- -- 3.12 -- 3.11     NuStep   Level -- 6 6 -- 7   Minutes -- 15 15 -- 15   METs -- 2.5 3.3 -- 4.1     REL-XR   Level -- 3 -- -- --   Minutes -- 15 -- -- --   METs -- 2.1 -- -- --     Track   Laps -- 30 30 -- 35   Minutes -- 15 15 -- 15   METs -- 2.63 2.63 -- 2.9     Home Exercise Plan   Plans to continue exercise at -- -- -- Home (comment)  walking, staff videos Home (comment)  walking, staff videos   Frequency --  -- --  Add 2 additional days to program exercise sessions. Add 2 additional days to program exercise sessions.   Initial Home Exercises Provided -- -- -- 10/19/21 10/19/21     Oxygen   Maintain Oxygen Saturation -- 88% or higher 88% or higher -- 88% or higher    Row Name 11/14/21 0800             Response to Exercise   Blood Pressure (Admit) 102/60       Blood Pressure (Exit) 110/62       Heart Rate (Admit) 62 bpm       Heart Rate (Exercise) 112 bpm       Heart Rate (Exit) 72 bpm       Rating of Perceived Exertion (Exercise) 15       Symptoms none       Duration Continue with 30 min of aerobic exercise without signs/symptoms of physical distress.       Intensity THRR unchanged         Progression   Progression Continue to progress workloads to maintain intensity without signs/symptoms of physical distress.       Average METs 3.38         Resistance Training   Training Prescription Yes       Weight 5 lb       Reps 10-15         Interval Training   Interval Training No         Treadmill   MPH 2       Grade 0       Minutes 15       METs 2.53         NuStep   Level 8       Minutes 15       METs 4.7         Track   Laps 30       Minutes 15       METs 2.63         Home Exercise Plan   Plans to continue exercise at Home (comment)  walking, staff videos       Frequency Add 2 additional days to program exercise sessions.       Initial Home Exercises Provided 10/19/21         Oxygen   Maintain Oxygen Saturation 88% or higher                Exercise Comments:   Exercise Comments     Row Name 09/21/21 0751           Exercise Comments First full day of exercise!  Patient was oriented to gym and equipment including functions, settings, policies, and procedures.  Patient's individual exercise prescription and treatment plan were reviewed.  All starting workloads were established based on the results of the 6 minute walk test done at initial orientation visit.   The plan for exercise progression was also introduced and progression will be customized based on patient's performance and goals.                Exercise Goals and Review:   Exercise Goals     Row Name 09/19/21 1158             Exercise Goals   Increase Physical Activity Yes       Intervention Provide advice, education, support and counseling about physical activity/exercise needs.;Develop an individualized exercise prescription for aerobic and resistive training based on  initial evaluation findings, risk stratification, comorbidities and participant's personal goals.       Expected Outcomes Long Term: Exercising regularly at least 3-5 days a week.;Short Term: Attend rehab on a regular basis to increase amount of physical activity.;Long Term: Add in home exercise to make exercise part of routine and to increase amount of physical activity.       Increase Strength and Stamina Yes       Intervention Provide advice, education, support and counseling about physical activity/exercise needs.;Develop an individualized exercise prescription for aerobic and resistive training based on initial evaluation findings, risk stratification, comorbidities and participant's personal goals.       Expected Outcomes Short Term: Increase workloads from initial exercise prescription for resistance, speed, and METs.;Long Term: Improve cardiorespiratory fitness, muscular endurance and strength as measured by increased METs and functional capacity (6MWT);Short Term: Perform resistance training exercises routinely during rehab and add in resistance training at home       Able to understand and use rate of perceived exertion (RPE) scale Yes       Intervention Provide education and explanation on how to use RPE scale       Expected Outcomes Short Term: Able to use RPE daily in rehab to express subjective intensity level;Long Term:  Able to use RPE to guide intensity level when exercising independently       Able to  understand and use Dyspnea scale Yes       Intervention Provide education and explanation on how to use Dyspnea scale       Expected Outcomes Short Term: Able to use Dyspnea scale daily in rehab to express subjective sense of shortness of breath during exertion;Long Term: Able to use Dyspnea scale to guide intensity level when exercising independently       Knowledge and understanding of Target Heart Rate Range (THRR) Yes       Intervention Provide education and explanation of THRR including how the numbers were predicted and where they are located for reference       Expected Outcomes Short Term: Able to state/look up THRR;Short Term: Able to use daily as guideline for intensity in rehab;Long Term: Able to use THRR to govern intensity when exercising independently       Able to check pulse independently Yes       Intervention Provide education and demonstration on how to check pulse in carotid and radial arteries.;Review the importance of being able to check your own pulse for safety during independent exercise       Expected Outcomes Short Term: Able to explain why pulse checking is important during independent exercise;Long Term: Able to check pulse independently and accurately       Understanding of Exercise Prescription Yes       Intervention Provide education, explanation, and written materials on patient's individual exercise prescription       Expected Outcomes Short Term: Able to explain program exercise prescription;Long Term: Able to explain home exercise prescription to exercise independently                Exercise Goals Re-Evaluation :  Exercise Goals Re-Evaluation     Row Name 09/21/21 0752 10/03/21 0906 10/17/21 1102 10/19/21 0807 10/31/21 0902     Exercise Goal Re-Evaluation   Exercise Goals Review Able to understand and use rate of perceived exertion (RPE) scale;Knowledge and understanding of Target Heart Rate Range (THRR);Understanding of Exercise Prescription;Increase  Physical Activity;Increase Strength and Stamina;Able to understand and use Dyspnea scale;Able  to check pulse independently Increase Physical Activity;Increase Strength and Stamina;Understanding of Exercise Prescription Increase Physical Activity;Increase Strength and Stamina;Understanding of Exercise Prescription Increase Physical Activity;Increase Strength and Stamina;Understanding of Exercise Prescription Increase Physical Activity;Increase Strength and Stamina;Understanding of Exercise Prescription   Comments Reviewed RPE and dyspnea scales, THR and program prescription with pt today.  Pt voiced understanding and was given a copy of goals to take home. Loki is off to a good start in rehab.  He has completed his first four full days of exercise thus far.  He is up to 30 laps on the track and level 6 on the NuStep. We will conitnue to montior his progress. Diangelo continues to do well in rehab. He has worked up to 38 watts on the recumbent bike at level 3. He has exercised at 30 laps on the track and should be encouraged to increase beyond that. He continued to hit his THR. Will continue to monitor. Mearle is doing well in rehab.  He is starting to feel like his stamina is starting to recover.  Reviewed home exercise with pt today.  Pt plans to walk and use staff videos at home for exercise.  Reviewed THR, pulse, RPE, sign and symptoms, pulse oximetery and when to call 911 or MD.  Also discussed weather considerations and indoor options.  Pt voiced understanding. Arvie is doing well in rehab. He recently improved to level 7 on the T4. He also improved to 35 laps on the track as well. He has improved from 4 lb to 5 lb hand weights for resistance training as well. Revere also improved his overall average METs to 3.34 METs. We will continue to monitor his progress in the program.   Expected Outcomes Short: Use RPE daily to regulate intensity. Long: Follow program prescription in THR. Short: Conitnue to attend  rehab reguarly Long: Continue to follow program prescription Short: Increase number of laps on track Long: Continue to increase overall MET level Short: Start to add in more walking at home  Long: Continue to improve stamina Short: Continue to push for more laps on the track. Long: Continue to improve strength and stamina.    [place] Name 11/11/21 0753 11/14/21 0852           Exercise Goal Re-Evaluation   Exercise Goals Review Increase Physical Activity;Increase Strength and Stamina;Understanding of Exercise Prescription Increase Physical Activity;Increase Strength and Stamina;Understanding of Exercise Prescription      Comments Kush is doing well in rehab.  He is going to [employer] two to three times a week.  He is feeling stronger and getting his stamina back. Coran is doing well in rehab. He tolerated using the treadmill at a speed of 2 mph with no incline. He also improved to level 8 on the T4. Bartlett improved his overall average MET level as well to 3.38 METs. We will continue to monitor his progress in the program.      Expected Outcomes Short: Conitnue to go to [employer] on off days Long: Conitnue to improve stamina Short: add incline on treadmill to increase workload. Long: Continue to improve strength and stamina.               Discharge Exercise Prescription (Final Exercise Prescription Changes):  Exercise Prescription Changes - 11/14/21 0800       Response to Exercise   Blood Pressure (Admit) 102/60    Blood Pressure (Exit) 110/62    Heart Rate (Admit) 62 bpm    Heart Rate (Exercise)  112 bpm    Heart Rate (Exit) 72 bpm    Rating of Perceived Exertion (Exercise) 15    Symptoms none    Duration Continue with 30 min of aerobic exercise without signs/symptoms of physical distress.    Intensity THRR unchanged      Progression   Progression Continue to progress workloads to maintain intensity without signs/symptoms of physical distress.    Average METs 3.38       Resistance Training   Training Prescription Yes    Weight 5 lb    Reps 10-15      Interval Training   Interval Training No      Treadmill   MPH 2    Grade 0    Minutes 15    METs 2.53      NuStep   Level 8    Minutes 15    METs 4.7      Track   Laps 30    Minutes 15    METs 2.63      Home Exercise Plan   Plans to continue exercise at Home (comment)   walking, staff videos   Frequency Add 2 additional days to program exercise sessions.    Initial Home Exercises Provided 10/19/21      Oxygen   Maintain Oxygen Saturation 88% or higher             Nutrition:  Target Goals: Understanding of nutrition guidelines, daily intake of sodium '1500mg'$ , cholesterol '200mg'$ , calories 30% from fat and 7% or less from saturated fats, daily to have 5 or more servings of fruits and vegetables.  Education: All About Nutrition: -Group instruction provided by verbal, written material, interactive activities, discussions, models, and posters to present general guidelines for heart healthy nutrition including fat, fiber, MyPlate, the role of sodium in heart healthy nutrition, utilization of the nutrition label, and utilization of this knowledge for meal planning. Follow up email sent as well. Written material given at graduation. Flowsheet Row Cardiac Rehab from 10/19/2021 in [facility] Cardiac and Pulmonary Rehab  Education need identified 09/19/21  Date 10/19/21  Educator [facility]  Instruction Review Code 1- Verbalizes Understanding       Biometrics:  Pre Biometrics - 09/19/21 1158       Pre Biometrics   Height 5' 8.5" (1.74 m)    Weight 238 lb 3.2 oz (108 kg)    BMI (Calculated) 35.69    Single Leg Stand 0.9 seconds             Post Biometrics - 11/21/21 0817        Post  Biometrics   Height 5' 8.5" (1.74 m)    Weight 237 lb 8 oz (107.7 kg)    BMI (Calculated) 35.58    Single Leg Stand 7.9 seconds             Nutrition Therapy Plan and Nutrition Goals:  Nutrition  Therapy & Goals - 11/07/21 0846       Nutrition Therapy   Diet Heart healthy, low Na    Drug/Food Interactions Statins/Certain Fruits    Protein (specify units) 80g    Fiber 30 grams    Whole Grain Foods 3 servings    Saturated Fats 16 max. grams    Fruits and Vegetables 8 servings/day    Sodium 2 grams      Personal Nutrition Goals   Nutrition Goal ST: include 2 snacks during the day with fiber, protein, and fat LT: meet nutritional needs  with variety and added snacks    Comments 61 y.o. M admitted to cardiac rehab s/p CABG x3. PMHx includes recurrent malignant melanoma , cancer in groin s/p radiation and resection, anemia, atherosclerosis, CAD, arthritis, GERD, HLD, HTN, OSA, CKD stg 3. Relevant medications includes lipitor, clonazepam, uloric, ferrous sulfate, hydrochlorothiazide, hydrocodone, lorazepam, MVI, omeprazole, zofran, Nivolumab. PYP Score: 64. Vegetables & Fruits 5/12. Breads, Grains & Cereals 5/12. Red & Processed Meat 9/12. Poultry 2/2. Fish & Shellfish 0/4. Beans, Nuts & Seeds 1/4. Milk & Dairy Foods 5/6. Toppings, Oils, Seasonings & Salt 18/20. Sweets, Snacks & Restaurant Food 13/14. Beverages 6/10. He feels that he is drinking too many sodas 1x/day, but he has cut back a lot; he would like to try crystal lite instead. Whatever him and his wife have in the house to eat; she will normally cook dinner. S: pack of nab crackers or candy bar D: meat, vegetables: frozen mixed vegetables, broccoli. He started eating just one snack during the day as he thought it would be healthier and he enjoys it. Discussed how eating consistently can help to meet nutritional needs and provide energy during the day; suggested that if he didn't want to include more meals, to inlcude more nutritionally dense snacks such as fruit with nuts/seeds, eggs, 1/2 sandwich, etc. He does not like walnuts or yogurt as well as some fruits and vegetables; discussed how variety of foods helps to meet nutritional needs and  that he doesn't have to like all foods to stay healthy - suggested other options aside from fish and walnuts for omega-3 FAs such as chia seeds, ground flaxseed, and omega-3 fortified eggs. Reviewed heart healthy eating.      Intervention Plan   Intervention Prescribe, educate and counsel regarding individualized specific dietary modifications aiming towards targeted core components such as weight, hypertension, lipid management, diabetes, heart failure and other comorbidities.    Expected Outcomes Short Term Goal: Understand basic principles of dietary content, such as calories, fat, sodium, cholesterol and nutrients.;Short Term Goal: A plan has been developed with personal nutrition goals set during dietitian appointment.;Long Term Goal: Adherence to prescribed nutrition plan.             Nutrition Assessments:  MEDIFICTS Score Key: ?70 Need to make dietary changes  40-70 Heart Healthy Diet ? 40 Therapeutic Level Cholesterol Diet  Flowsheet Row Cardiac Rehab from 11/25/2021 in [facility] Cardiac and Pulmonary Rehab  Picture Your Plate Total Score on Admission 64  Picture Your Plate Total Score on Discharge 60      Picture Your Plate Scores: <68 Unhealthy dietary pattern with much room for improvement. 41-50 Dietary pattern unlikely to meet recommendations for good health and room for improvement. 51-60 More healthful dietary pattern, with some room for improvement.  >60 Healthy dietary pattern, although there may be some specific behaviors that could be improved.    Nutrition Goals Re-Evaluation:  Nutrition Goals Re-Evaluation     [place] Name 10/19/21 0827 11/11/21 0757           Goals   Nutrition Goal He has been waiting on scheduling MD appointments to schedule, will check back in to schedule for August. ST: include 2 snacks during the day with fiber, protein, and fat LT: meet nutritional needs with variety and added snacks      Comment Tong will be starting cancer treatments on  Friday 7/28 and last set of MD appt are first of August, once he knows his schedule he will make appt with dietitian  Fernand is doing well with his diet.  He met with Melissa recently and has added in more salads to get in more fiber.  He is trying to get in more protein in his snacks.      Expected Outcome Meet with dietitian Short: Conitnue to add protein Long: Conitnue to work on portion control               Nutrition Goals Discharge (Final Nutrition Goals Re-Evaluation):  Nutrition Goals Re-Evaluation - 11/11/21 0757       Goals   Nutrition Goal ST: include 2 snacks during the day with fiber, protein, and fat LT: meet nutritional needs with variety and added snacks    Comment Durell is doing well with his diet.  He met with Melissa recently and has added in more salads to get in more fiber.  He is trying to get in more protein in his snacks.    Expected Outcome Short: Conitnue to add protein Long: Conitnue to work on portion control             Psychosocial: Target Goals: Acknowledge presence or absence of significant depression and/or stress, maximize coping skills, provide positive support system. Participant is able to verbalize types and ability to use techniques and skills needed for reducing stress and depression.   Education: Stress, Anxiety, and Depression - Group verbal and visual presentation to define topics covered.  Reviews how body is impacted by stress, anxiety, and depression.  Also discusses healthy ways to reduce stress and to treat/manage anxiety and depression.  Written material given at graduation.   Education: Sleep Hygiene -Provides group verbal and written instruction about how sleep can affect your health.  Define sleep hygiene, discuss sleep cycles and impact of sleep habits. Review good sleep hygiene tips.    Initial Review & Psychosocial Screening:  Initial Psych Review & Screening - 09/14/21 0941       Initial Review   Current issues with None  Identified      Family Dynamics   Good Support System? Yes    Comments He can look to his borther and sister for support. He feels ready to get his health back in order. Patients states depression or anxiety.      Barriers   Psychosocial barriers to participate in program The patient should benefit from training in stress management and relaxation.;There are no identifiable barriers or psychosocial needs.      Screening Interventions   Interventions Encouraged to exercise;To provide support and resources with identified psychosocial needs;Provide feedback about the scores to participant    Expected Outcomes Short Term goal: Utilizing psychosocial counselor, staff and physician to assist with identification of specific Stressors or current issues interfering with healing process. Setting desired goal for each stressor or current issue identified.;Long Term Goal: Stressors or current issues are controlled or eliminated.;Short Term goal: Identification and review with participant of any Quality of Life or Depression concerns found by scoring the questionnaire.;Long Term goal: The participant improves quality of Life and PHQ9 Scores as seen by post scores and/or verbalization of changes             Quality of Life Scores:   Quality of Life - 11/25/21 0808       Quality of Life   Select Quality of Life      Quality of Life Scores   Health/Function Pre 23.43 %    Health/Function Post 25.3 %    Health/Function % Change 7.98 %  Socioeconomic Pre 27.33 %    Socioeconomic Post 28.07 %    Socioeconomic % Change  2.71 %    Psych/Spiritual Pre 28.29 %    Psych/Spiritual Post 30 %    Psych/Spiritual % Change 6.04 %    Family Pre 28.5 %    Family Post 25.5 %    Family % Change -10.53 %    GLOBAL Pre 25.86 %    GLOBAL Post 26.91 %    GLOBAL % Change 4.06 %            Scores of 19 and below usually indicate a poorer quality of life in these areas.  A difference of  2-3 points is a  clinically meaningful difference.  A difference of 2-3 points in the total score of the Quality of Life Index has been associated with significant improvement in overall quality of life, self-image, physical symptoms, and general health in studies assessing change in quality of life.  PHQ-9: Review Flowsheet       11/25/2021 09/19/2021  Depression screen PHQ 2/9  Decreased Interest 0 0  Down, Depressed, Hopeless 0 0  PHQ - 2 Score 0 0  Altered sleeping 3 2  Tired, decreased energy 2 2  Change in appetite 0 0  Feeling bad or failure about yourself  0 0  Trouble concentrating 0 0  Moving slowly or fidgety/restless 0 0  Suicidal thoughts 0 0  PHQ-9 Score 5 4  Difficult doing work/chores Not difficult at all Not difficult at all   Interpretation of Total Score  Total Score Depression Severity:  1-4 = Minimal depression, 5-9 = Mild depression, 10-14 = Moderate depression, 15-19 = Moderately severe depression, 20-27 = Severe depression   Psychosocial Evaluation and Intervention:  Psychosocial Evaluation - 09/14/21 0945       Psychosocial Evaluation & Interventions   Interventions Encouraged to exercise with the program and follow exercise prescription;Relaxation education;Stress management education    Comments He can look to his borther and sister for support. He feels ready to get his health back in order. Patients states depression or anxiety.    Expected Outcomes Short: Start [facility] to help with mood. Long: Maintain a healthy mental state    Continue Psychosocial Services  Follow up required by staff             Psychosocial Re-Evaluation:  Psychosocial Re-Evaluation     Row Name 10/19/21 0825 11/11/21 0754           Psychosocial Re-Evaluation   Current issues with Current Stress Concerns Current Stress Concerns;Current Sleep Concerns      Comments Govind is doing well in rehab. He had a PET scan last week and they found another lesion on his liver but are not able  to biopsy it.  He is scheduled to start chemo infusion treatments again on Friday and will get an infusion every two weeks.  He will also have blood work done on Friday.  Last round of treatments, he did not have side effects and hopes that will be the case again this go around. Otherwise, he is feeling good mentally and just takes it one day at a time. Earnest had a test done yesterday for cancer.  They placed device on top of his incision and he is still sore today. He is hoping to have the results back over the weekend.  He is still getting treatments and has his next one next Friday.  He still has some good night  and some hard nights with his sleep.  He is trying to stay positive and focus on the good.      Expected Outcomes Short: Get through treatments Long: Continue to stay positive Short: Hear back about biopsy Long: Continue to focus on positive      Interventions Stress management education;Encouraged to attend Cardiac Rehabilitation for the exercise Stress management education;Encouraged to attend Cardiac Rehabilitation for the exercise      Continue Psychosocial Services  Follow up required by staff --               Psychosocial Discharge (Final Psychosocial Re-Evaluation):  Psychosocial Re-Evaluation - 11/11/21 0754       Psychosocial Re-Evaluation   Current issues with Current Stress Concerns;Current Sleep Concerns    Comments Vinal had a test done yesterday for cancer.  They placed device on top of his incision and he is still sore today. He is hoping to have the results back over the weekend.  He is still getting treatments and has his next one next Friday.  He still has some good night and some hard nights with his sleep.  He is trying to stay positive and focus on the good.    Expected Outcomes Short: Hear back about biopsy Long: Continue to focus on positive    Interventions Stress management education;Encouraged to attend Cardiac Rehabilitation for the exercise              Vocational Rehabilitation: Provide vocational rehab assistance to qualifying candidates.   Vocational Rehab Evaluation & Intervention:   Education: Education Goals: Education classes will be provided on a variety of topics geared toward better understanding of heart health and risk factor modification. Participant will state understanding/return demonstration of topics presented as noted by education test scores.  Learning Barriers/Preferences:  Learning Barriers/Preferences - 09/14/21 0940       Learning Barriers/Preferences   Learning Barriers None    Learning Preferences None             General Cardiac Education Topics:  AED/CPR: - Group verbal and written instruction with the use of models to demonstrate the basic use of the AED with the basic ABC's of resuscitation.   Anatomy and Cardiac Procedures: - Group verbal and visual presentation and models provide information about basic cardiac anatomy and function. Reviews the testing methods done to diagnose heart disease and the outcomes of the test results. Describes the treatment choices: Medical Management, Angioplasty, or Coronary Bypass Surgery for treating various heart conditions including Myocardial Infarction, Angina, Valve Disease, and Cardiac Arrhythmias.  Written material given at graduation. Flowsheet Row Cardiac Rehab from 10/19/2021 in [facility] Cardiac and Pulmonary Rehab  Education need identified 09/19/21  Date 10/05/21  Educator SB  Instruction Review Code 1- Verbalizes Understanding       Medication Safety: - Group verbal and visual instruction to review commonly prescribed medications for heart and lung disease. Reviews the medication, class of the drug, and side effects. Includes the steps to properly store meds and maintain the prescription regimen.  Written material given at graduation.   Intimacy: - Group verbal instruction through game format to discuss how heart and lung disease can affect  sexual intimacy. Written material given at graduation..   Know Your Numbers and Heart Failure: - Group verbal and visual instruction to discuss disease risk factors for cardiac and pulmonary disease and treatment options.  Reviews associated critical values for Overweight/Obesity, Hypertension, Cholesterol, and Diabetes.  Discusses basics of heart failure: signs/symptoms  and treatments.  Introduces Heart Failure Zone chart for action plan for heart failure.  Written material given at graduation. Flowsheet Row Cardiac Rehab from 10/19/2021 in [facility] Cardiac and Pulmonary Rehab  Education need identified 09/19/21       Infection Prevention: - Provides verbal and written material to individual with discussion of infection control including proper hand washing and proper equipment cleaning during exercise session. Flowsheet Row Cardiac Rehab from 10/19/2021 in [facility] Cardiac and Pulmonary Rehab  Date 09/14/21  Educator [facility]  Instruction Review Code 1- Verbalizes Understanding       Falls Prevention: - Provides verbal and written material to individual with discussion of falls prevention and safety. Flowsheet Row Cardiac Rehab from 10/19/2021 in [facility] Cardiac and Pulmonary Rehab  Date 09/14/21  Educator [facility]  Instruction Review Code 1- Verbalizes Understanding       Other: -Provides group and verbal instruction on various topics (see comments)   Knowledge Questionnaire Score:  Knowledge Questionnaire Score - 11/25/21 9476       Knowledge Questionnaire Score   Pre Score 21/26    Post Score 22/26             Core Components/Risk Factors/Patient Goals at Admission:  Personal Goals and Risk Factors at Admission - 09/19/21 1200       Core Components/Risk Factors/Patient Goals on Admission    Weight Management Yes;Weight Loss;Obesity    Intervention Weight Management: Develop a combined nutrition and exercise program designed to reach desired caloric intake, while maintaining  appropriate intake of nutrient and fiber, sodium and fats, and appropriate energy expenditure required for the weight goal.;Weight Management: Provide education and appropriate resources to help participant work on and attain dietary goals.;Weight Management/Obesity: Establish reasonable short term and long term weight goals.;Obesity: Provide education and appropriate resources to help participant work on and attain dietary goals.    Admit Weight 238 lb 3.2 oz (108 kg)    Goal Weight: Short Term 233 lb (105.7 kg)    Goal Weight: Long Term 220 lb (99.8 kg)    Expected Outcomes Short Term: Continue to assess and modify interventions until short term weight is achieved;Long Term: Adherence to nutrition and physical activity/exercise program aimed toward attainment of established weight goal;Weight Loss: Understanding of general recommendations for a balanced deficit meal plan, which promotes 1-2 lb weight loss per week and includes a negative energy balance of [phone] kcal/d;Understanding recommendations for meals to include 15-35% energy as protein, 25-35% energy from fat, 35-60% energy from carbohydrates, less than $RemoveB'200mg'KpErvfNY$  of dietary cholesterol, 20-35 gm of total fiber daily;Understanding of distribution of calorie intake throughout the day with the consumption of 4-5 meals/snacks    Hypertension Yes    Intervention Provide education on lifestyle modifcations including regular physical activity/exercise, weight management, moderate sodium restriction and increased consumption of fresh fruit, vegetables, and low fat dairy, alcohol moderation, and smoking cessation.;Monitor prescription use compliance.    Expected Outcomes Long Term: Maintenance of blood pressure at goal levels.;Short Term: Continued assessment and intervention until BP is < 140/2mm HG in hypertensive participants. < 130/79mm HG in hypertensive participants with diabetes, heart failure or chronic kidney disease.    Lipids Yes    Intervention  Provide education and support for participant on nutrition & aerobic/resistive exercise along with prescribed medications to achieve LDL '70mg'$ , HDL >$Remo'40mg'kCWxj$ .    Expected Outcomes Short Term: Participant states understanding of desired cholesterol values and is compliant with medications prescribed. Participant is following exercise prescription and nutrition guidelines.;Long Term:  Cholesterol controlled with medications as prescribed, with individualized exercise RX and with personalized nutrition plan. Value goals: LDL < $Rem'70mg'bXws$ , HDL > 40 mg.             Education:Diabetes - Individual verbal and written instruction to review signs/symptoms of diabetes, desired ranges of glucose level fasting, after meals and with exercise. Acknowledge that pre and post exercise glucose checks will be done for 3 sessions at entry of program.   Core Components/Risk Factors/Patient Goals Review:   Goals and Risk Factor Review     Row Name 10/19/21 5409 11/11/21 0759           Core Components/Risk Factors/Patient Goals Review   Personal Goals Review Weight Management/Obesity;Hypertension;Lipids Weight Management/Obesity;Hypertension;Lipids      Review Hristopher is doing well in rehab.  He had lost weight after his cancer treatments before but has started to gain it back now.  He will also be starting treatments again on Friday.  He still wants to lose some weight.  His pressures are doing well in class.  He does not check them at home but was encouraged to do so.  He said that he needs to find his cuff and maybe either put new batteries in it or get a new one.  He is doing well with his medications overall. Kennith is doing wel in rehab.  His weight is trending down again.  His pressures are doing well in class.  He is still not checking at home.  He is doing well with his meds.  He is waiting to hear back from biopsy yesterday and getting treatments.      Expected Outcomes Short: Find BP cuff to use at home Long:  Conitnue to monitor risk factors. Short: Continue to work on weight loss Long: conitnue to monitor risk factors.               Core Components/Risk Factors/Patient Goals at Discharge (Final Review):   Goals and Risk Factor Review - 11/11/21 0759       Core Components/Risk Factors/Patient Goals Review   Personal Goals Review Weight Management/Obesity;Hypertension;Lipids    Review Yifan is doing wel in rehab.  His weight is trending down again.  His pressures are doing well in class.  He is still not checking at home.  He is doing well with his meds.  He is waiting to hear back from biopsy yesterday and getting treatments.    Expected Outcomes Short: Continue to work on weight loss Long: conitnue to monitor risk factors.             ITP Comments:  ITP Comments     Row Name 09/14/21 0938 09/19/21 1143 09/21/21 0751 10/05/21 0740 11/02/21 8119   ITP Comments Virtual Visit completed. Patient informed on EP and RD appointment and 6 Minute walk test. Patient also informed of patient health questionnaires on My Chart. Patient Verbalizes understanding. Visit diagnosis can be found in [facility] 08/08/2021. Completed 6MWT and gym orientation. Initial ITP created and sent for review to Dr. Emily Filbert, Medical Director. First full day of exercise!  Patient was oriented to gym and equipment including functions, settings, policies, and procedures.  Patient's individual exercise prescription and treatment plan were reviewed.  All starting workloads were established based on the results of the 6 minute walk test done at initial orientation visit.  The plan for exercise progression was also introduced and progression will be customized based on patient's performance and goals. 30 Day review completed. Medical  Director ITP review done, changes made as directed, and signed approval by Market researcher.   New 30 Day review completed. Medical Director ITP review done, changes made as directed, and signed approval by  Medical Director.    [place] Name 11/07/21 0910 11/30/21 0729         ITP Comments Completed initial RD consultation 30 Day review completed. Medical Director ITP review done, changes made as directed, and signed approval by Medical Director.               Comments:

## 2021-12-02 ENCOUNTER — Encounter: Payer: Medicare Other | Admitting: *Deleted

## 2021-12-02 ENCOUNTER — Encounter: Payer: Self-pay | Admitting: Oncology

## 2021-12-02 ENCOUNTER — Inpatient Hospital Stay: Payer: Medicare Other

## 2021-12-02 ENCOUNTER — Inpatient Hospital Stay: Payer: Medicare Other | Attending: Oncology | Admitting: Oncology

## 2021-12-02 DIAGNOSIS — K760 Fatty (change of) liver, not elsewhere classified: Secondary | ICD-10-CM | POA: Diagnosis not present

## 2021-12-02 DIAGNOSIS — C438 Malignant melanoma of overlapping sites of skin: Secondary | ICD-10-CM

## 2021-12-02 DIAGNOSIS — I700 Atherosclerosis of aorta: Secondary | ICD-10-CM | POA: Diagnosis not present

## 2021-12-02 DIAGNOSIS — R932 Abnormal findings on diagnostic imaging of liver and biliary tract: Secondary | ICD-10-CM | POA: Diagnosis not present

## 2021-12-02 DIAGNOSIS — Z923 Personal history of irradiation: Secondary | ICD-10-CM | POA: Diagnosis not present

## 2021-12-02 DIAGNOSIS — I2510 Atherosclerotic heart disease of native coronary artery without angina pectoris: Secondary | ICD-10-CM | POA: Diagnosis not present

## 2021-12-02 DIAGNOSIS — Z5112 Encounter for antineoplastic immunotherapy: Secondary | ICD-10-CM

## 2021-12-02 DIAGNOSIS — Z79899 Other long term (current) drug therapy: Secondary | ICD-10-CM | POA: Insufficient documentation

## 2021-12-02 DIAGNOSIS — Z7982 Long term (current) use of aspirin: Secondary | ICD-10-CM | POA: Insufficient documentation

## 2021-12-02 DIAGNOSIS — E611 Iron deficiency: Secondary | ICD-10-CM | POA: Insufficient documentation

## 2021-12-02 DIAGNOSIS — C4359 Malignant melanoma of other part of trunk: Secondary | ICD-10-CM | POA: Insufficient documentation

## 2021-12-02 DIAGNOSIS — K769 Liver disease, unspecified: Secondary | ICD-10-CM | POA: Diagnosis not present

## 2021-12-02 DIAGNOSIS — N1831 Chronic kidney disease, stage 3a: Secondary | ICD-10-CM

## 2021-12-02 DIAGNOSIS — K439 Ventral hernia without obstruction or gangrene: Secondary | ICD-10-CM | POA: Diagnosis not present

## 2021-12-02 DIAGNOSIS — C774 Secondary and unspecified malignant neoplasm of inguinal and lower limb lymph nodes: Secondary | ICD-10-CM | POA: Insufficient documentation

## 2021-12-02 DIAGNOSIS — E785 Hyperlipidemia, unspecified: Secondary | ICD-10-CM | POA: Insufficient documentation

## 2021-12-02 DIAGNOSIS — Z951 Presence of aortocoronary bypass graft: Secondary | ICD-10-CM

## 2021-12-02 DIAGNOSIS — I1310 Hypertensive heart and chronic kidney disease without heart failure, with stage 1 through stage 4 chronic kidney disease, or unspecified chronic kidney disease: Secondary | ICD-10-CM | POA: Diagnosis not present

## 2021-12-02 DIAGNOSIS — I252 Old myocardial infarction: Secondary | ICD-10-CM | POA: Diagnosis not present

## 2021-12-02 DIAGNOSIS — R569 Unspecified convulsions: Secondary | ICD-10-CM | POA: Diagnosis not present

## 2021-12-02 DIAGNOSIS — R160 Hepatomegaly, not elsewhere classified: Secondary | ICD-10-CM | POA: Insufficient documentation

## 2021-12-02 DIAGNOSIS — K5730 Diverticulosis of large intestine without perforation or abscess without bleeding: Secondary | ICD-10-CM | POA: Insufficient documentation

## 2021-12-02 DIAGNOSIS — N1832 Chronic kidney disease, stage 3b: Secondary | ICD-10-CM | POA: Diagnosis not present

## 2021-12-02 DIAGNOSIS — D631 Anemia in chronic kidney disease: Secondary | ICD-10-CM | POA: Diagnosis not present

## 2021-12-02 LAB — CBC WITH DIFFERENTIAL/PLATELET
Abs Immature Granulocytes: 0.03 10*3/uL (ref 0.00–0.07)
Basophils Absolute: 0 10*3/uL (ref 0.0–0.1)
Basophils Relative: 0 %
Eosinophils Absolute: 0.3 10*3/uL (ref 0.0–0.5)
Eosinophils Relative: 6 %
HCT: 33.8 % — ABNORMAL LOW (ref 39.0–52.0)
Hemoglobin: 11.2 g/dL — ABNORMAL LOW (ref 13.0–17.0)
Immature Granulocytes: 1 %
Lymphocytes Relative: 19 %
Lymphs Abs: 1 10*3/uL (ref 0.7–4.0)
MCH: 29.9 pg (ref 26.0–34.0)
MCHC: 33.1 g/dL (ref 30.0–36.0)
MCV: 90.4 fL (ref 80.0–100.0)
Monocytes Absolute: 0.5 10*3/uL (ref 0.1–1.0)
Monocytes Relative: 10 %
Neutro Abs: 3.2 10*3/uL (ref 1.7–7.7)
Neutrophils Relative %: 64 %
Platelets: 189 10*3/uL (ref 150–400)
RBC: 3.74 MIL/uL — ABNORMAL LOW (ref 4.22–5.81)
RDW: 14.4 % (ref 11.5–15.5)
WBC: 5.1 10*3/uL (ref 4.0–10.5)
nRBC: 0 % (ref 0.0–0.2)

## 2021-12-02 LAB — COMPREHENSIVE METABOLIC PANEL
ALT: 35 U/L (ref 0–44)
AST: 28 U/L (ref 15–41)
Albumin: 4.1 g/dL (ref 3.5–5.0)
Alkaline Phosphatase: 76 U/L (ref 38–126)
Anion gap: 5 (ref 5–15)
BUN: 40 mg/dL — ABNORMAL HIGH (ref 8–23)
CO2: 21 mmol/L — ABNORMAL LOW (ref 22–32)
Calcium: 8.9 mg/dL (ref 8.9–10.3)
Chloride: 113 mmol/L — ABNORMAL HIGH (ref 98–111)
Creatinine, Ser: 1.72 mg/dL — ABNORMAL HIGH (ref 0.61–1.24)
GFR, Estimated: 44 mL/min — ABNORMAL LOW (ref >60)
Glucose, Bld: 137 mg/dL — ABNORMAL HIGH (ref 70–99)
Potassium: 4.5 mmol/L (ref 3.5–5.1)
Sodium: 139 mmol/L (ref 135–145)
Total Bilirubin: 0.4 mg/dL (ref 0.3–1.2)
Total Protein: 6.8 g/dL (ref 6.5–8.1)

## 2021-12-02 MED ORDER — SODIUM CHLORIDE 0.9 % IV SOLN
240.0000 mg | Freq: Once | INTRAVENOUS | Status: AC
  Administered 2021-12-02: 240 mg via INTRAVENOUS
  Filled 2021-12-02: qty 24

## 2021-12-02 MED ORDER — HEPARIN SOD (PORK) LOCK FLUSH 100 UNIT/ML IV SOLN
500.0000 [IU] | Freq: Once | INTRAVENOUS | Status: AC
  Administered 2021-12-02: 500 [IU] via INTRAVENOUS
  Filled 2021-12-02: qty 5

## 2021-12-02 MED ORDER — HEPARIN SOD (PORK) LOCK FLUSH 100 UNIT/ML IV SOLN
INTRAVENOUS | Status: AC
  Filled 2021-12-02: qty 5

## 2021-12-02 MED ORDER — HEPARIN SOD (PORK) LOCK FLUSH 100 UNIT/ML IV SOLN
500.0000 [IU] | Freq: Once | INTRAVENOUS | Status: DC | PRN
  Filled 2021-12-02: qty 5

## 2021-12-02 MED ORDER — SODIUM CHLORIDE 0.9 % IV SOLN
Freq: Once | INTRAVENOUS | Status: AC
  Filled 2021-12-02: qty 250

## 2021-12-02 MED ORDER — SODIUM CHLORIDE 0.9% FLUSH
10.0000 mL | Freq: Once | INTRAVENOUS | Status: AC
  Administered 2021-12-02: 10 mL via INTRAVENOUS
  Filled 2021-12-02: qty 10

## 2021-12-02 NOTE — Assessment & Plan Note (Signed)
avoid nephrotoxins.   creatinine fluctuates. Encourage oral hydration.    

## 2021-12-02 NOTE — Progress Notes (Deleted)
Cardiac Individual Treatment Plan  Patient Details  Name: Kacper Cartlidge MRN: [id number] Date of Birth: Dec 29, 1960 Referring Provider:   Flowsheet Row Cardiac Rehab from 09/19/2021 in [facility] Cardiac and Pulmonary Rehab  Referring Provider Serafina Royals MD       Initial Encounter Date:  Flowsheet Row Cardiac Rehab from 09/19/2021 in [facility] Cardiac and Pulmonary Rehab  Date 09/19/21       Visit Diagnosis: S/P CABG x 3  Patient's Home Medications on Admission:  Current Outpatient Medications:    aspirin 81 MG EC tablet, Take 81 mg by mouth daily., Disp: , Rfl:    atorvastatin (LIPITOR) 20 MG tablet, Take 20 mg by mouth daily., Disp: , Rfl:    Carboxymeth-Glyc-Polysorb PF (REFRESH OPTIVE MEGA-3) 0.5-1-0.5 % SOLN, Place 1 drop into both eyes daily as needed (dry eyes)., Disp: , Rfl:    clonazePAM (KLONOPIN) 0.5 MG tablet, TAKE 0.$RemoveBefore'5MG'KqAasLiqkvVFQ$  IN AM, $Remo'1MG'qOziJ$  IN PM, Disp: 90 tablet, Rfl: 0   febuxostat (ULORIC) 40 MG tablet, Take 40 mg by mouth daily., Disp: , Rfl:    ferrous sulfate 325 (65 FE) MG EC tablet, Take 1 tablet (325 mg total) by mouth 2 (two) times daily with a meal., Disp: 60 tablet, Rfl: 3   hydrochlorothiazide (HYDRODIURIL) 25 MG tablet, , Disp: , Rfl:    HYDROcodone-acetaminophen (NORCO/VICODIN) 5-325 MG tablet, , Disp: , Rfl:    hydrocortisone 2.5 % ointment, Apply 1 application. topically daily as needed (itching)., Disp: , Rfl:    Lacosamide (VIMPAT) 100 MG TABS, TAKE 1 TABLET (100 MG TOTAL) BY MOUTH IN THE MORNING AND AT BEDTIME., Disp: 60 tablet, Rfl: 3   lidocaine-prilocaine (EMLA) cream, Apply 1 application. topically as needed. Apply small amount of cream to port site approx 1-2 hours prior to appointment., Disp: 30 g, Rfl: 11   lisinopril (ZESTRIL) 20 MG tablet, Take 20 mg by mouth daily., Disp: , Rfl:    LORazepam (ATIVAN) 2 MG tablet, TAKE 1 TABLET (2 MG TOTAL) BY MOUTH EVERY 8 (EIGHT) HOURS AS NEEDED FOR SEIZURE., Disp: 20 tablet, Rfl: 0   metoprolol succinate (TOPROL-XL) 25 MG  24 hr tablet, Take 25 mg by mouth daily., Disp: , Rfl:    Multiple Vitamin (MULTIVITAMIN WITH MINERALS) TABS tablet, Take 1 tablet by mouth daily. Centrum Silver, Disp: , Rfl:    omeprazole (PRILOSEC) 20 MG capsule, TAKE 1 CAPSULE BY MOUTH EVERY DAY, Disp: 90 capsule, Rfl: 0   ondansetron (ZOFRAN) 4 MG tablet, TAKE 1 TABLET BY MOUTH EVERY 8 HOURS AS NEEDED FOR NAUSEA AND VOMITING, Disp: 90 tablet, Rfl: 1 No current facility-administered medications for this visit.  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   Family history of adverse reaction to anesthesia    PONV mother   GERD (gastroesophageal reflux disease)    History of complete heart block    PPM placed   Hyperlipidemia    Hypertension    LBBB (left bundle branch block)  Lymphedema of left leg    uses thigh high compression stockings   Melanoma ([place]) 2012   skin cancer, left thigh   OSA on CPAP    PONV (postoperative nausea and vomiting) 04/16/2019   Port-A-Cath in place    RIGHT chest wall   Presence of cardiac pacemaker    [employer]   Seizures ([place])    still has episodes of dizziness. last event 1 month ago (march 2022) and will pass out. takes clonazepam    Tobacco Use: Social History   Tobacco Use  Smoking Status Never  Smokeless Tobacco Never    Labs: Review Flowsheet  More data exists      Latest Ref Rng & Units 08/06/2021 08/09/2021 08/10/2021 08/11/2021 08/12/2021  Labs for ITP Cardiac and Pulmonary Rehab  Cholestrol 0 - 200 mg/dL 156  - - - -  LDL  (calc) 0 - 99 mg/dL 91  - - - -  HDL-C >40 mg/dL 34  - - - -  Trlycerides <150 mg/dL 153  - - - -  Hemoglobin A1c 4.8 - 5.6 % - 5.7  - - -  PH, Arterial 7.35 - 7.45 - 7.39  7.340  7.331  7.292  7.363  7.370  7.370  7.385  7.341  7.370  -  PCO2 arterial 32 - 48 mmHg - 40  40.0  43.8  49.7  41.0  41.4  44.9  41.9  43.3  27.6  -  Bicarbonate 20.0 - 28.0 mmol/L - 24.2  21.6  23.6  24.0  23.3  23.9  26.0  25.3  25.1  23.4  16.0  -  TCO2 22 - 32 mmol/L - - $R'23  25  26  23  25  25  25  25  27  28  27  26  23  23  25  17  'BA$ -  Acid-base deficit 0.0 - 2.0 mmol/L - 0.7  4.0  3.0  3.0  2.0  1.0  2.0  8.0  -  O2 Saturation % - 97.7  98  95  96  100  100  100  70  100  98  91  72.9  58.7      Exercise Target Goals: Exercise Program Goal: Individual exercise prescription set using results from initial 6 min walk test and THRR while considering  patient's activity barriers and safety.   Exercise Prescription Goal: Initial exercise prescription builds to 30-45 minutes a day of aerobic activity, 2-3 days per week.  Home exercise guidelines will be given to patient during program as part of exercise prescription that the participant will acknowledge.   Education: Aerobic Exercise: - Group verbal and visual presentation on the components of exercise prescription. Introduces F.I.T.T principle from ACSM for exercise prescriptions.  Reviews F.I.T.T. principles of aerobic exercise including progression. Written material given at graduation. Flowsheet Row Cardiac Rehab from 11/30/2021 in [facility] Cardiac and Pulmonary Rehab  Education need identified 09/19/21  Date 11/30/21  Educator [facility]  Instruction Review Code 1- Verbalizes Understanding       Education: Resistance Exercise: - Group verbal and visual presentation on the components of exercise prescription. Introduces F.I.T.T principle from ACSM for exercise prescriptions  Reviews F.I.T.T. principles of resistance exercise including progression. Written material  given at graduation.    Education: Exercise & Equipment Safety: - Individual verbal instruction and demonstration of equipment use and safety with use of the equipment. Flowsheet Row Cardiac Rehab from 11/30/2021 in [facility] Cardiac and Pulmonary  Rehab  Date 09/14/21  Educator [facility]  Instruction Review Code 1- Verbalizes Understanding       Education: Exercise Physiology & General Exercise Guidelines: - Group verbal and written instruction with models to review the exercise physiology of the cardiovascular system and associated critical values. Provides general exercise guidelines with specific guidelines to those with heart or lung disease.  Flowsheet Row Cardiac Rehab from 11/30/2021 in [facility] Cardiac and Pulmonary Rehab  Date 09/21/21  Educator [facility]  Instruction Review Code 1- [employer] Understanding       Education: Flexibility, Balance, Mind/Body Relaxation: - Group verbal and visual presentation with interactive activity on the components of exercise prescription. Introduces F.I.T.T principle from ACSM for exercise prescriptions. Reviews F.I.T.T. principles of flexibility and balance exercise training including progression. Also discusses the mind body connection.  Reviews various relaxation techniques to help reduce and manage stress (i.e. Deep breathing, progressive muscle relaxation, and visualization). Balance handout provided to take home. Written material given at graduation. Flowsheet Row Cardiac Rehab from 11/30/2021 in [facility] Cardiac and Pulmonary Rehab  Date 11/30/21  Educator [facility]  Instruction Review Code 1- Verbalizes Understanding       Activity Barriers & Risk Stratification:  Activity Barriers & Cardiac Risk Stratification - 09/19/21 1145       Activity Barriers & Cardiac Risk Stratification   Activity Barriers Deconditioning;Muscular Weakness;Shortness of Breath;Balance Concerns;Other (comment)    Comments compression sock on left leg for swelling    Cardiac Risk  Stratification High             6 Minute Walk:  6 Minute Walk     Row Name 09/19/21 1143 11/21/21 0816       6 Minute Walk   Phase Initial Discharge    Distance 1042 feet 1345 feet    Distance % Change -- 29.1 %    Distance Feet Change -- 303 ft    Walk Time 6 minutes 6 minutes    # of Rest Breaks 0 0    MPH 1.97 2.55    METS 2.43 3.13    RPE 9 11    Perceived Dyspnea  1 --    VO2 Peak 8.51 10.97    Symptoms Yes (comment) No    Comments SOB --    Resting HR 61 bpm 63 bpm    Resting BP 128/74 102/62    Resting Oxygen Saturation  95 % 95 %    Exercise Oxygen Saturation  during 6 min walk 96 % 96 %    Max Ex. HR 106 bpm 107 bpm    Max Ex. BP 126/74 146/74    2 Minute Post BP 124/62 --             Oxygen Initial Assessment:   Oxygen Re-Evaluation:   Oxygen Discharge (Final Oxygen Re-Evaluation):   Initial Exercise Prescription:  Initial Exercise Prescription - 09/19/21 1100       Date of Initial Exercise RX and Referring Provider   Date 09/19/21    Referring Provider Arnoldo Hooker MD      Oxygen   Maintain Oxygen Saturation 88% or higher      Treadmill   MPH 1.9    Grade 0    Minutes 15    METs 2.45      Recumbant Bike   Level 1    RPM 50    Watts 15    Minutes 15    METs 2.5      NuStep  Level 1    SPM 80    Minutes 15    METs 2.5      Track   Laps 26    Minutes 15    METs 2.41      Prescription Details   Frequency (times per week) 3    Duration Progress to 30 minutes of continuous aerobic without signs/symptoms of physical distress      Intensity   THRR 40-80% of Max Heartrate 100-139    Ratings of Perceived Exertion 11-13    Perceived Dyspnea 0-4      Progression   Progression Continue to progress workloads to maintain intensity without signs/symptoms of physical distress.      Resistance Training   Training Prescription Yes    Weight 4 lb    Reps 10-15             Perform Capillary Blood Glucose checks as  needed.  Exercise Prescription Changes:   Exercise Prescription Changes     Row Name 09/19/21 1100 10/03/21 0900 10/17/21 1000 10/19/21 0800 10/31/21 0800     Response to Exercise   Blood Pressure (Admit) 128/74 128/76 126/64 -- 124/68   Blood Pressure (Exercise) 126/74 160/94 126/70 -- --   Blood Pressure (Exit) 124/62 118/62 124/70 -- 108/56   Heart Rate (Admit) 61 bpm 56 bpm 69 bpm -- 63 bpm   Heart Rate (Exercise) 106 bpm 100 bpm 108 bpm -- 112 bpm   Heart Rate (Exit) 76 bpm 72 bpm 71 bpm -- 64 bpm   Oxygen Saturation (Admit) 95 % -- -- -- --   Oxygen Saturation (Exercise) 96 % -- -- -- --   Rating of Perceived Exertion (Exercise) 9 13 15  -- 15   Perceived Dyspnea (Exercise) 1 -- -- -- --   Symptoms SOB none none -- none   Comments walk test results -- -- -- --   Duration -- Continue with 30 min of aerobic exercise without signs/symptoms of physical distress. Continue with 30 min of aerobic exercise without signs/symptoms of physical distress. -- Continue with 30 min of aerobic exercise without signs/symptoms of physical distress.   Intensity -- THRR unchanged THRR unchanged -- THRR unchanged     Progression   Progression -- Continue to progress workloads to maintain intensity without signs/symptoms of physical distress. Continue to progress workloads to maintain intensity without signs/symptoms of physical distress. -- Continue to progress workloads to maintain intensity without signs/symptoms of physical distress.   Average METs -- 2.41 2.91 -- 3.34     Resistance Training   Training Prescription -- Yes Yes -- Yes   Weight -- 4 lb 4 lb -- 5 lb   Reps -- 10-15 10-15 -- 10-15     Interval Training   Interval Training -- No No -- No     Recumbant Bike   Level -- -- 3 -- 3   Watts -- -- 38 -- 38   Minutes -- -- 15 -- 15   METs -- -- 3.12 -- 3.11     NuStep   Level -- 6 6 -- 7   Minutes -- 15 15 -- 15   METs -- 2.5 3.3 -- 4.1     REL-XR   Level -- 3 -- -- --    Minutes -- 15 -- -- --   METs -- 2.1 -- -- --     Track   Laps -- 30 30 -- 35   Minutes -- 15 15 -- 15  METs -- 2.63 2.63 -- 2.9     Home Exercise Plan   Plans to continue exercise at -- -- -- Home (comment)  walking, staff videos Home (comment)  walking, staff videos   Frequency -- -- -- Add 2 additional days to program exercise sessions. Add 2 additional days to program exercise sessions.   Initial Home Exercises Provided -- -- -- 10/19/21 10/19/21     Oxygen   Maintain Oxygen Saturation -- 88% or higher 88% or higher -- 88% or higher    Row Name 11/14/21 0800 11/30/21 1400           Response to Exercise   Blood Pressure (Admit) 102/60 112/64      Blood Pressure (Exit) 110/62 122/60      Heart Rate (Admit) 62 bpm 62 bpm      Heart Rate (Exercise) 112 bpm 107 bpm      Heart Rate (Exit) 72 bpm 72 bpm      Rating of Perceived Exertion (Exercise) 15 15      Symptoms none none      Duration Continue with 30 min of aerobic exercise without signs/symptoms of physical distress. Continue with 30 min of aerobic exercise without signs/symptoms of physical distress.      Intensity THRR unchanged THRR unchanged        Progression   Progression Continue to progress workloads to maintain intensity without signs/symptoms of physical distress. Continue to progress workloads to maintain intensity without signs/symptoms of physical distress.      Average METs 3.38 3.63        Resistance Training   Training Prescription Yes Yes      Weight 5 lb 5 lb      Reps 10-15 10-15        Interval Training   Interval Training No No        Treadmill   MPH 2 --      Grade 0 --      Minutes 15 --      METs 2.53 --        NuStep   Level 8 7      Minutes 15 15      METs 4.7 5        Biostep-RELP   Level -- 6      Minutes -- 15      METs -- 4        Track   Laps 30 30      Minutes 15 15      METs 2.63 2.63        Home Exercise Plan   Plans to continue exercise at Home (comment)   walking, staff videos Home (comment)  walking, staff videos      Frequency Add 2 additional days to program exercise sessions. Add 2 additional days to program exercise sessions.      Initial Home Exercises Provided 10/19/21 10/19/21        Oxygen   Maintain Oxygen Saturation 88% or higher 88% or higher               Exercise Comments:   Exercise Comments     Row Name 09/21/21 0751           Exercise Comments First full day of exercise!  Patient was oriented to gym and equipment including functions, settings, policies, and procedures.  Patient's individual exercise prescription and treatment plan were reviewed.  All starting workloads were established based on the  results of the 6 minute walk test done at initial orientation visit.  The plan for exercise progression was also introduced and progression will be customized based on patient's performance and goals.                Exercise Goals and Review:   Exercise Goals     Row Name 09/19/21 1158             Exercise Goals   Increase Physical Activity Yes       Intervention Provide advice, education, support and counseling about physical activity/exercise needs.;Develop an individualized exercise prescription for aerobic and resistive training based on initial evaluation findings, risk stratification, comorbidities and participant's personal goals.       Expected Outcomes Long Term: Exercising regularly at least 3-5 days a week.;Short Term: Attend rehab on a regular basis to increase amount of physical activity.;Long Term: Add in home exercise to make exercise part of routine and to increase amount of physical activity.       Increase Strength and Stamina Yes       Intervention Provide advice, education, support and counseling about physical activity/exercise needs.;Develop an individualized exercise prescription for aerobic and resistive training based on initial evaluation findings, risk stratification, comorbidities and  participant's personal goals.       Expected Outcomes Short Term: Increase workloads from initial exercise prescription for resistance, speed, and METs.;Long Term: Improve cardiorespiratory fitness, muscular endurance and strength as measured by increased METs and functional capacity (6MWT);Short Term: Perform resistance training exercises routinely during rehab and add in resistance training at home       Able to understand and use rate of perceived exertion (RPE) scale Yes       Intervention Provide education and explanation on how to use RPE scale       Expected Outcomes Short Term: Able to use RPE daily in rehab to express subjective intensity level;Long Term:  Able to use RPE to guide intensity level when exercising independently       Able to understand and use Dyspnea scale Yes       Intervention Provide education and explanation on how to use Dyspnea scale       Expected Outcomes Short Term: Able to use Dyspnea scale daily in rehab to express subjective sense of shortness of breath during exertion;Long Term: Able to use Dyspnea scale to guide intensity level when exercising independently       Knowledge and understanding of Target Heart Rate Range (THRR) Yes       Intervention Provide education and explanation of THRR including how the numbers were predicted and where they are located for reference       Expected Outcomes Short Term: Able to state/look up THRR;Short Term: Able to use daily as guideline for intensity in rehab;Long Term: Able to use THRR to govern intensity when exercising independently       Able to check pulse independently Yes       Intervention Provide education and demonstration on how to check pulse in carotid and radial arteries.;Review the importance of being able to check your own pulse for safety during independent exercise       Expected Outcomes Short Term: Able to explain why pulse checking is important during independent exercise;Long Term: Able to check pulse  independently and accurately       Understanding of Exercise Prescription Yes       Intervention Provide education, explanation, and written materials on patient's individual exercise  prescription       Expected Outcomes Short Term: Able to explain program exercise prescription;Long Term: Able to explain home exercise prescription to exercise independently                Exercise Goals Re-Evaluation :  Exercise Goals Re-Evaluation     Row Name 09/21/21 0752 10/03/21 0906 10/17/21 1102 10/19/21 0807 10/31/21 0902     Exercise Goal Re-Evaluation   Exercise Goals Review Able to understand and use rate of perceived exertion (RPE) scale;Knowledge and understanding of Target Heart Rate Range (THRR);Understanding of Exercise Prescription;Increase Physical Activity;Increase Strength and Stamina;Able to understand and use Dyspnea scale;Able to check pulse independently Increase Physical Activity;Increase Strength and Stamina;Understanding of Exercise Prescription Increase Physical Activity;Increase Strength and Stamina;Understanding of Exercise Prescription Increase Physical Activity;Increase Strength and Stamina;Understanding of Exercise Prescription Increase Physical Activity;Increase Strength and Stamina;Understanding of Exercise Prescription   Comments Reviewed RPE and dyspnea scales, THR and program prescription with pt today.  Pt voiced understanding and was given a copy of goals to take home. Menelik is off to a good start in rehab.  He has completed his first four full days of exercise thus far.  He is up to 30 laps on the track and level 6 on the NuStep. We will conitnue to montior his progress. Jalyn continues to do well in rehab. He has worked up to 38 watts on the recumbent bike at level 3. He has exercised at 30 laps on the track and should be encouraged to increase beyond that. He continued to hit his THR. Will continue to monitor. Jakai is doing well in rehab.  He is starting to feel like  his stamina is starting to recover.  Reviewed home exercise with pt today.  Pt plans to walk and use staff videos at home for exercise.  Reviewed THR, pulse, RPE, sign and symptoms, pulse oximetery and when to call 911 or MD.  Also discussed weather considerations and indoor options.  Pt voiced understanding. Vishwa is doing well in rehab. He recently improved to level 7 on the T4. He also improved to 35 laps on the track as well. He has improved from 4 lb to 5 lb hand weights for resistance training as well. Dhillon also improved his overall average METs to 3.34 METs. We will continue to monitor his progress in the program.   Expected Outcomes Short: Use RPE daily to regulate intensity. Long: Follow program prescription in THR. Short: Conitnue to attend rehab reguarly Long: Continue to follow program prescription Short: Increase number of laps on track Long: Continue to increase overall MET level Short: Start to add in more walking at home  Long: Continue to improve stamina Short: Continue to push for more laps on the track. Long: Continue to improve strength and stamina.    [place] Name 11/11/21 0753 11/14/21 0852 11/30/21 1404         Exercise Goal Re-Evaluation   Exercise Goals Review Increase Physical Activity;Increase Strength and Stamina;Understanding of Exercise Prescription Increase Physical Activity;Increase Strength and Stamina;Understanding of Exercise Prescription Increase Physical Activity;Increase Strength and Stamina;Understanding of Exercise Prescription     Comments Deavin is doing well in rehab.  He is going to [employer] two to three times a week.  He is feeling stronger and getting his stamina back. Marquavion is doing well in rehab. He tolerated using the treadmill at a speed of 2 mph with no incline. He also improved to level 8 on the T4. Watson improved his overall  average MET level as well to 3.38 METs. We will continue to monitor his progress in the program. Yariel continues to do well  in rehab. His overall METs increased again to about 3.6. He tried out the [employer] for the first time and was able to work at level 6. His laps have been consistent at 30 and we hope to see that improve overtime. Will continue to monitor.     Expected Outcomes Short: Conitnue to go to [employer] on off days Long: Conitnue to improve stamina Short: add incline on treadmill to increase workload. Long: Continue to improve strength and stamina. Short: Increase laps on track over 30 Long: Continue to increase overall MET level              Discharge Exercise Prescription (Final Exercise Prescription Changes):  Exercise Prescription Changes - 11/30/21 1400       Response to Exercise   Blood Pressure (Admit) 112/64    Blood Pressure (Exit) 122/60    Heart Rate (Admit) 62 bpm    Heart Rate (Exercise) 107 bpm    Heart Rate (Exit) 72 bpm    Rating of Perceived Exertion (Exercise) 15    Symptoms none    Duration Continue with 30 min of aerobic exercise without signs/symptoms of physical distress.    Intensity THRR unchanged      Progression   Progression Continue to progress workloads to maintain intensity without signs/symptoms of physical distress.    Average METs 3.63      Resistance Training   Training Prescription Yes    Weight 5 lb    Reps 10-15      Interval Training   Interval Training No      NuStep   Level 7    Minutes 15    METs 5      Biostep-RELP   Level 6    Minutes 15    METs 4      Track   Laps 30    Minutes 15    METs 2.63      Home Exercise Plan   Plans to continue exercise at Home (comment)   walking, staff videos   Frequency Add 2 additional days to program exercise sessions.    Initial Home Exercises Provided 10/19/21      Oxygen   Maintain Oxygen Saturation 88% or higher             Nutrition:  Target Goals: Understanding of nutrition guidelines, daily intake of sodium '1500mg'$ , cholesterol '200mg'$ , calories 30% from fat and 7% or less  from saturated fats, daily to have 5 or more servings of fruits and vegetables.  Education: All About Nutrition: -Group instruction provided by verbal, written material, interactive activities, discussions, models, and posters to present general guidelines for heart healthy nutrition including fat, fiber, MyPlate, the role of sodium in heart healthy nutrition, utilization of the nutrition label, and utilization of this knowledge for meal planning. Follow up email sent as well. Written material given at graduation. Flowsheet Row Cardiac Rehab from 11/30/2021 in [facility] Cardiac and Pulmonary Rehab  Education need identified 09/19/21  Date 10/19/21  Educator [place]  Instruction Review Code 1- Verbalizes Understanding       Biometrics:  Pre Biometrics - 09/19/21 1158       Pre Biometrics   Height 5' 8.5" (1.74 m)    Weight 238 lb 3.2 oz (108 kg)    BMI (Calculated) 35.69    Single Leg Stand 0.9 seconds  Post Biometrics - 11/21/21 0817        Post  Biometrics   Height 5' 8.5" (1.74 m)    Weight 237 lb 8 oz (107.7 kg)    BMI (Calculated) 35.58    Single Leg Stand 7.9 seconds             Nutrition Therapy Plan and Nutrition Goals:  Nutrition Therapy & Goals - 11/07/21 0846       Nutrition Therapy   Diet Heart healthy, low Na    Drug/Food Interactions Statins/Certain Fruits    Protein (specify units) 80g    Fiber 30 grams    Whole Grain Foods 3 servings    Saturated Fats 16 max. grams    Fruits and Vegetables 8 servings/day    Sodium 2 grams      Personal Nutrition Goals   Nutrition Goal ST: include 2 snacks during the day with fiber, protein, and fat LT: meet nutritional needs with variety and added snacks    Comments 61 y.o. M admitted to cardiac rehab s/p CABG x3. PMHx includes recurrent malignant melanoma , cancer in groin s/p radiation and resection, anemia, atherosclerosis, CAD, arthritis, GERD, HLD, HTN, OSA, CKD stg 3. Relevant medications includes  lipitor, clonazepam, uloric, ferrous sulfate, hydrochlorothiazide, hydrocodone, lorazepam, MVI, omeprazole, zofran, Nivolumab. PYP Score: 64. Vegetables & Fruits 5/12. Breads, Grains & Cereals 5/12. Red & Processed Meat 9/12. Poultry 2/2. Fish & Shellfish 0/4. Beans, Nuts & Seeds 1/4. Milk & Dairy Foods 5/6. Toppings, Oils, Seasonings & Salt 18/20. Sweets, Snacks & Restaurant Food 13/14. Beverages 6/10. He feels that he is drinking too many sodas 1x/day, but he has cut back a lot; he would like to try crystal lite instead. Whatever him and his wife have in the house to eat; she will normally cook dinner. S: pack of nab crackers or candy bar D: meat, vegetables: frozen mixed vegetables, broccoli. He started eating just one snack during the day as he thought it would be healthier and he enjoys it. Discussed how eating consistently can help to meet nutritional needs and provide energy during the day; suggested that if he didn't want to include more meals, to inlcude more nutritionally dense snacks such as fruit with nuts/seeds, eggs, 1/2 sandwich, etc. He does not like walnuts or yogurt as well as some fruits and vegetables; discussed how variety of foods helps to meet nutritional needs and that he doesn't have to like all foods to stay healthy - suggested other options aside from fish and walnuts for omega-3 FAs such as chia seeds, ground flaxseed, and omega-3 fortified eggs. Reviewed heart healthy eating.      Intervention Plan   Intervention Prescribe, educate and counsel regarding individualized specific dietary modifications aiming towards targeted core components such as weight, hypertension, lipid management, diabetes, heart failure and other comorbidities.    Expected Outcomes Short Term Goal: Understand basic principles of dietary content, such as calories, fat, sodium, cholesterol and nutrients.;Short Term Goal: A plan has been developed with personal nutrition goals set during dietitian appointment.;Long  Term Goal: Adherence to prescribed nutrition plan.             Nutrition Assessments:  MEDIFICTS Score Key: ?70 Need to make dietary changes  40-70 Heart Healthy Diet ? 40 Therapeutic Level Cholesterol Diet  Flowsheet Row Cardiac Rehab from 11/25/2021 in [facility]  Picture Your Plate Total Score on Admission 64  Picture Your Plate Total Score on Discharge 60  Picture Your Plate Scores: <82 Unhealthy dietary pattern with much room for improvement. 41-50 Dietary pattern unlikely to meet recommendations for good health and room for improvement. 51-60 More healthful dietary pattern, with some room for improvement.  >60 Healthy dietary pattern, although there may be some specific behaviors that could be improved.    Nutrition Goals Re-Evaluation:  Nutrition Goals Re-Evaluation     [place] Name 10/19/21 0827 11/11/21 0757           Goals   Nutrition Goal He has been waiting on scheduling MD appointments to schedule, will check back in to schedule for August. ST: include 2 snacks during the day with fiber, protein, and fat LT: meet nutritional needs with variety and added snacks      Comment Kalon will be starting cancer treatments on Friday 7/28 and last set of MD appt are first of August, once he knows his schedule he will make appt with dietitian Micholas is doing well with his diet.  He met with Melissa recently and has added in more salads to get in more fiber.  He is trying to get in more protein in his snacks.      Expected Outcome Meet with dietitian Short: Conitnue to add protein Long: Conitnue to work on portion control               Nutrition Goals Discharge (Final Nutrition Goals Re-Evaluation):  Nutrition Goals Re-Evaluation - 11/11/21 0757       Goals   Nutrition Goal ST: include 2 snacks during the day with fiber, protein, and fat LT: meet nutritional needs with variety and added snacks    Comment Rmani is doing well with his diet.  He  met with Melissa recently and has added in more salads to get in more fiber.  He is trying to get in more protein in his snacks.    Expected Outcome Short: Conitnue to add protein Long: Conitnue to work on portion control             Psychosocial: Target Goals: Acknowledge presence or absence of significant depression and/or stress, maximize coping skills, provide positive support system. Participant is able to verbalize types and ability to use techniques and skills needed for reducing stress and depression.   Education: Stress, Anxiety, and Depression - Group verbal and visual presentation to define topics covered.  Reviews how body is impacted by stress, anxiety, and depression.  Also discusses healthy ways to reduce stress and to treat/manage anxiety and depression.  Written material given at graduation.   Education: Sleep Hygiene -Provides group verbal and written instruction about how sleep can affect your health.  Define sleep hygiene, discuss sleep cycles and impact of sleep habits. Review good sleep hygiene tips.    Initial Review & Psychosocial Screening:  Initial Psych Review & Screening - 09/14/21 0941       Initial Review   Current issues with None Identified      Family Dynamics   Good Support System? Yes    Comments He can look to his borther and sister for support. He feels ready to get his health back in order. Patients states depression or anxiety.      Barriers   Psychosocial barriers to participate in program The patient should benefit from training in stress management and relaxation.;There are no identifiable barriers or psychosocial needs.      Screening Interventions   Interventions Encouraged to exercise;To provide support and resources with identified psychosocial needs;Provide feedback  about the scores to participant    Expected Outcomes Short Term goal: Utilizing psychosocial counselor, staff and physician to assist with identification of specific  Stressors or current issues interfering with healing process. Setting desired goal for each stressor or current issue identified.;Long Term Goal: Stressors or current issues are controlled or eliminated.;Short Term goal: Identification and review with participant of any Quality of Life or Depression concerns found by scoring the questionnaire.;Long Term goal: The participant improves quality of Life and PHQ9 Scores as seen by post scores and/or verbalization of changes             Quality of Life Scores:   Quality of Life - 11/25/21 0808       Quality of Life   Select Quality of Life      Quality of Life Scores   Health/Function Pre 23.43 %    Health/Function Post 25.3 %    Health/Function % Change 7.98 %    Socioeconomic Pre 27.33 %    Socioeconomic Post 28.07 %    Socioeconomic % Change  2.71 %    Psych/Spiritual Pre 28.29 %    Psych/Spiritual Post 30 %    Psych/Spiritual % Change 6.04 %    Family Pre 28.5 %    Family Post 25.5 %    Family % Change -10.53 %    GLOBAL Pre 25.86 %    GLOBAL Post 26.91 %    GLOBAL % Change 4.06 %            Scores of 19 and below usually indicate a poorer quality of life in these areas.  A difference of  2-3 points is a clinically meaningful difference.  A difference of 2-3 points in the total score of the Quality of Life Index has been associated with significant improvement in overall quality of life, self-image, physical symptoms, and general health in studies assessing change in quality of life.  PHQ-9: Review Flowsheet       11/25/2021 09/19/2021  Depression screen PHQ 2/9  Decreased Interest 0 0  Down, Depressed, Hopeless 0 0  PHQ - 2 Score 0 0  Altered sleeping 3 2  Tired, decreased energy 2 2  Change in appetite 0 0  Feeling bad or failure about yourself  0 0  Trouble concentrating 0 0  Moving slowly or fidgety/restless 0 0  Suicidal thoughts 0 0  PHQ-9 Score 5 4  Difficult doing work/chores Not difficult at all Not  difficult at all   Interpretation of Total Score  Total Score Depression Severity:  1-4 = Minimal depression, 5-9 = Mild depression, 10-14 = Moderate depression, 15-19 = Moderately severe depression, 20-27 = Severe depression   Psychosocial Evaluation and Intervention:  Psychosocial Evaluation - 09/14/21 0945       Psychosocial Evaluation & Interventions   Interventions Encouraged to exercise with the program and follow exercise prescription;Relaxation education;Stress management education    Comments He can look to his borther and sister for support. He feels ready to get his health back in order. Patients states depression or anxiety.    Expected Outcomes Short: Start [facility] to help with mood. Long: Maintain a healthy mental state    Continue Psychosocial Services  Follow up required by staff             Psychosocial Re-Evaluation:  Psychosocial Re-Evaluation     [place] Name 10/19/21 0825 11/11/21 0754           Psychosocial Re-Evaluation   Current  issues with Current Stress Concerns Current Stress Concerns;Current Sleep Concerns      Comments Terelle is doing well in rehab. He had a PET scan last week and they found another lesion on his liver but are not able to biopsy it.  He is scheduled to start chemo infusion treatments again on Friday and will get an infusion every two weeks.  He will also have blood work done on Friday.  Last round of treatments, he did not have side effects and hopes that will be the case again this go around. Otherwise, he is feeling good mentally and just takes it one day at a time. Deitrick had a test done yesterday for cancer.  They placed device on top of his incision and he is still sore today. He is hoping to have the results back over the weekend.  He is still getting treatments and has his next one next Friday.  He still has some good night and some hard nights with his sleep.  He is trying to stay positive and focus on the good.      Expected  Outcomes Short: Get through treatments Long: Continue to stay positive Short: Hear back about biopsy Long: Continue to focus on positive      Interventions Stress management education;Encouraged to attend Cardiac Rehabilitation for the exercise Stress management education;Encouraged to attend Cardiac Rehabilitation for the exercise      Continue Psychosocial Services  Follow up required by staff --               Psychosocial Discharge (Final Psychosocial Re-Evaluation):  Psychosocial Re-Evaluation - 11/11/21 0754       Psychosocial Re-Evaluation   Current issues with Current Stress Concerns;Current Sleep Concerns    Comments Ahmet had a test done yesterday for cancer.  They placed device on top of his incision and he is still sore today. He is hoping to have the results back over the weekend.  He is still getting treatments and has his next one next Friday.  He still has some good night and some hard nights with his sleep.  He is trying to stay positive and focus on the good.    Expected Outcomes Short: Hear back about biopsy Long: Continue to focus on positive    Interventions Stress management education;Encouraged to attend Cardiac Rehabilitation for the exercise             Vocational Rehabilitation: Provide vocational rehab assistance to qualifying candidates.   Vocational Rehab Evaluation & Intervention:   Education: Education Goals: Education classes will be provided on a variety of topics geared toward better understanding of heart health and risk factor modification. Participant will state understanding/return demonstration of topics presented as noted by education test scores.  Learning Barriers/Preferences:  Learning Barriers/Preferences - 09/14/21 0940       Learning Barriers/Preferences   Learning Barriers None    Learning Preferences None             General Cardiac Education Topics:  AED/CPR: - Group verbal and written instruction with the use of  models to demonstrate the basic use of the AED with the basic ABC's of resuscitation.   Anatomy and Cardiac Procedures: - Group verbal and visual presentation and models provide information about basic cardiac anatomy and function. Reviews the testing methods done to diagnose heart disease and the outcomes of the test results. Describes the treatment choices: Medical Management, Angioplasty, or Coronary Bypass Surgery for treating various heart conditions including Myocardial Infarction,  Angina, Valve Disease, and Cardiac Arrhythmias.  Written material given at graduation. Flowsheet Row Cardiac Rehab from 11/30/2021 in [facility] Rehab  Education need identified 09/19/21  Date 10/05/21  Educator [place]  Instruction Review Code 1- Verbalizes Understanding       Medication Safety: - Group verbal and visual instruction to review commonly prescribed medications for heart and lung disease. Reviews the medication, class of the drug, and side effects. Includes the steps to properly store meds and maintain the prescription regimen.  Written material given at graduation.   Intimacy: - Group verbal instruction through game format to discuss how heart and lung disease can affect sexual intimacy. Written material given at graduation..   Know Your Numbers and Heart Failure: - Group verbal and visual instruction to discuss disease risk factors for cardiac and pulmonary disease and treatment options.  Reviews associated critical values for Overweight/Obesity, Hypertension, Cholesterol, and Diabetes.  Discusses basics of heart failure: signs/symptoms and treatments.  Introduces Heart Failure Zone chart for action plan for heart failure.  Written material given at graduation. Flowsheet Row Cardiac Rehab from 11/30/2021 in [facility] Cardiac and Pulmonary Rehab  Education need identified 09/19/21       Infection Prevention: - Provides verbal and written material to individual with discussion of  infection control including proper hand washing and proper equipment cleaning during exercise session. Flowsheet Row Cardiac Rehab from 11/30/2021 in [facility]  Date 09/14/21  Educator [facility]  Instruction Review Code 1- Verbalizes Understanding       Falls Prevention: - Provides verbal and written material to individual with discussion of falls prevention and safety. Flowsheet Row Cardiac Rehab from 11/30/2021 in [facility] Cardiac and Pulmonary Rehab  Date 09/14/21  Educator [facility]  Instruction Review Code 1- Verbalizes Understanding       Other: -Provides group and verbal instruction on various topics (see comments)   Knowledge Questionnaire Score:  Knowledge Questionnaire Score - 11/25/21 5208       Knowledge Questionnaire Score   Pre Score 21/26    Post Score 22/26             Core Components/Risk Factors/Patient Goals at Admission:  Personal Goals and Risk Factors at Admission - 09/19/21 1200       Core Components/Risk Factors/Patient Goals on Admission    Weight Management Yes;Weight Loss;Obesity    Intervention Weight Management: Develop a combined nutrition and exercise program designed to reach desired caloric intake, while maintaining appropriate intake of nutrient and fiber, sodium and fats, and appropriate energy expenditure required for the weight goal.;Weight Management: Provide education and appropriate resources to help participant work on and attain dietary goals.;Weight Management/Obesity: Establish reasonable short term and long term weight goals.;Obesity: Provide education and appropriate resources to help participant work on and attain dietary goals.    Admit Weight 238 lb 3.2 oz (108 kg)    Goal Weight: Short Term 233 lb (105.7 kg)    Goal Weight: Long Term 220 lb (99.8 kg)    Expected Outcomes Short Term: Continue to assess and modify interventions until short term weight is achieved;Long Term: Adherence to nutrition and physical  activity/exercise program aimed toward attainment of established weight goal;Weight Loss: Understanding of general recommendations for a balanced deficit meal plan, which promotes 1-2 lb weight loss per week and includes a negative energy balance of [phone] kcal/d;Understanding recommendations for meals to include 15-35% energy as protein, 25-35% energy from fat, 35-60% energy from carbohydrates, less than $RemoveB'200mg'DzBmNYpd$  of  dietary cholesterol, 20-35 gm of total fiber daily;Understanding of distribution of calorie intake throughout the day with the consumption of 4-5 meals/snacks    Hypertension Yes    Intervention Provide education on lifestyle modifcations including regular physical activity/exercise, weight management, moderate sodium restriction and increased consumption of fresh fruit, vegetables, and low fat dairy, alcohol moderation, and smoking cessation.;Monitor prescription use compliance.    Expected Outcomes Long Term: Maintenance of blood pressure at goal levels.;Short Term: Continued assessment and intervention until BP is < 140/30mm HG in hypertensive participants. < 130/43mm HG in hypertensive participants with diabetes, heart failure or chronic kidney disease.    Lipids Yes    Intervention Provide education and support for participant on nutrition & aerobic/resistive exercise along with prescribed medications to achieve LDL '70mg'$ , HDL >$Remo'40mg'KMtaD$ .    Expected Outcomes Short Term: Participant states understanding of desired cholesterol values and is compliant with medications prescribed. Participant is following exercise prescription and nutrition guidelines.;Long Term: Cholesterol controlled with medications as prescribed, with individualized exercise RX and with personalized nutrition plan. Value goals: LDL < $Rem'70mg'AzdW$ , HDL > 40 mg.             Education:Diabetes - Individual verbal and written instruction to review signs/symptoms of diabetes, desired ranges of glucose level fasting, after meals and  with exercise. Acknowledge that pre and post exercise glucose checks will be done for 3 sessions at entry of program.   Core Components/Risk Factors/Patient Goals Review:   Goals and Risk Factor Review     Row Name 10/19/21 5465 11/11/21 0759           Core Components/Risk Factors/Patient Goals Review   Personal Goals Review Weight Management/Obesity;Hypertension;Lipids Weight Management/Obesity;Hypertension;Lipids      Review Kingsley is doing well in rehab.  He had lost weight after his cancer treatments before but has started to gain it back now.  He will also be starting treatments again on Friday.  He still wants to lose some weight.  His pressures are doing well in class.  He does not check them at home but was encouraged to do so.  He said that he needs to find his cuff and maybe either put new batteries in it or get a new one.  He is doing well with his medications overall. Archer is doing wel in rehab.  His weight is trending down again.  His pressures are doing well in class.  He is still not checking at home.  He is doing well with his meds.  He is waiting to hear back from biopsy yesterday and getting treatments.      Expected Outcomes Short: Find BP cuff to use at home Long: Conitnue to monitor risk factors. Short: Continue to work on weight loss Long: conitnue to monitor risk factors.               Core Components/Risk Factors/Patient Goals at Discharge (Final Review):   Goals and Risk Factor Review - 11/11/21 0759       Core Components/Risk Factors/Patient Goals Review   Personal Goals Review Weight Management/Obesity;Hypertension;Lipids    Review Derren is doing wel in rehab.  His weight is trending down again.  His pressures are doing well in class.  He is still not checking at home.  He is doing well with his meds.  He is waiting to hear back from biopsy yesterday and getting treatments.    Expected Outcomes Short: Continue to work on weight loss Long: conitnue to monitor  risk factors.  ITP Comments:  ITP Comments     Row Name 09/14/21 0938 09/19/21 1143 09/21/21 0751 10/05/21 0740 11/02/21 1252   ITP Comments Virtual Visit completed. Patient informed on EP and RD appointment and 6 Minute walk test. Patient also informed of patient health questionnaires on My Chart. Patient Verbalizes understanding. Visit diagnosis [facility] in [facility]08/[facility]. Completed 6MWT and gym orientation. Initial ITP created and sent for review to Dr. Emily Filbert, Medical Director. First full day of exercise!  Patient was oriented to gym and equipment including functions, settings, policies, and procedures.  Patient's individual exercise prescription and treatment plan were reviewed.  All starting workloads were established based on the results of the 6 minute walk test done at initial orientation visit.  The plan for exercise progression was also introduced and progression will be customized based on patient's performance and goals. 30 Day review completed. Medical Director ITP review done, changes made as directed, and signed approval by Medical Director.   New 30 Day review completed. Medical Director ITP review done, changes made as directed, and signed approval by Medical Director.    [place] Name 11/07/21 0910 11/30/21 0729         ITP Comments Completed initial RD consultation 30 Day review completed. Medical Director ITP review done, changes made as directed, and signed approval by Medical Director.               Aydden graduated today from  rehab with 36 sessions completed.  Details of the patient's exercise prescription and what He needs to do in order to continue the prescription and progress were discussed with patient.  Patient was given a copy of prescription and goals.  Patient verbalized understanding.  Sherman plans to continue to exercise by exercising at [employer]..   Comments: DISCHARGE ITP

## 2021-12-02 NOTE — Assessment & Plan Note (Signed)
Iron panel showed iron deficiency.  Recommend ferrous sulfate '325mg'$  BID.  Continue monitor.

## 2021-12-02 NOTE — Progress Notes (Signed)
Discharge Note  Edward Pitts Oct 02, 1960   [name] graduated today from  rehab with 36 sessions completed.  Details of the patient's exercise prescription and what He needs to do in order to continue the prescription and progress were discussed with patient.  Patient was given a copy of prescription and goals.  Patient verbalized understanding.  [name] plans to continue to exercise by exercising at [employer]..   [name] Name 09/19/21 1143 11/21/21 0816       6 Minute Walk   Phase Initial Discharge    Distance 1042 feet 1345 feet    Distance % Change -- 29.1 %    Distance Feet Change -- 303 ft    Walk Time 6 minutes 6 minutes    # of Rest Breaks 0 0    MPH 1.97 2.55    METS 2.43 3.13    RPE 9 11    Perceived Dyspnea  1 --    VO2 Peak 8.51 10.97    Symptoms Yes (comment) No    Comments SOB --    Resting HR 61 bpm 63 bpm    Resting BP 128/74 102/62    Resting Oxygen Saturation  95 % 95 %    Exercise Oxygen Saturation  during 6 min walk 96 % 96 %    Max Ex. HR 106 bpm 107 bpm    Max Ex. BP 126/74 146/74    2 Minute Post BP 124/62 --            Thank you for the referral. We enjoyed working with Otila Kluver.

## 2021-12-02 NOTE — Assessment & Plan Note (Signed)
Left inguinal nodal recurrence of melanoma.  Status post resection and adjuvant radiation. S/p 2 years of Nivolumab maintenance.  Recent CT, enhanced [place], PET, and MRI  findings showed small liver lesion which is not amendable for biopsy, however suspicious for metastatic disease.  Labs are reviewed and discussed with patient. Proceed with Nivolumab today Short term imaging follow up

## 2021-12-02 NOTE — Progress Notes (Signed)
Hematology/Oncology Progress note [phone] [phone]      Patient Care Team: Mechele Claude, FNP as PCP - General (Family Medicine) [name], MD as Consulting Physician (Oncology) Mickeal Skinner, Acey Lav, MD as Consulting Physician (Oncology) [name], MD as Consulting Physician (Cardiology) Mechele Claude, FNP (Family Medicine)  ASSESSMENT & PLAN:   Cancer Staging  Malignant melanoma of overlapping sites [facility]) Staging form: Melanoma of the Skin, AJCC 8th Edition - Pathologic: Stage Unknown (rpTX, pN1b, cM0) - Signed by [name], MD on 07/27/2020 - Pathologic: No stage assigned - Unsigned   Malignant melanoma of overlapping sites [facility]) Left inguinal nodal recurrence of melanoma.  Status post resection and adjuvant radiation. S/p 2 years of Nivolumab maintenance.  Recent CT, enhanced [place], PET, and MRI  findings showed small liver lesion which is not amendable for biopsy, however suspicious for metastatic disease.  Labs are reviewed and discussed with patient. Proceed with Nivolumab today Short term imaging follow up    Encounter for antineoplastic immunotherapy Immunotherapy plan as listed above  Stage 3a chronic kidney disease ([place]) avoid nephrotoxins.   creatinine fluctuates. Encourage oral hydration.     Anemia in chronic kidney disease Iron panel showed iron deficiency.  Recommend ferrous sulfate $RemoveBeforeD'325mg'KIpZTqbYFPPxFd$  BID.  Continue monitor.     No orders of the defined types were placed in this encounter.  Follow-up Lab MD 2 weeks Nivolumab All questions were answered. The patient knows to call the clinic with any problems, questions or concerns.  [name], MD, PhD [facility] Hematology Oncology 12/02/2021   CHIEF COMPLAINTS/REASON FOR VISIT:  Follow up for melanoma HISTORY OF PRESENTING ILLNESS:   Edward Pitts is a  61 y.o.  male presents for recurrent malignant melanoma.   Oncology History  Malignant melanoma of overlapping sites  [facility])  04/16/2019 Cancer Staging   Staging form: Melanoma of the Skin, AJCC 8th Edition - Pathologic: Stage Unknown (rpTX, pN1b, cM0) - Signed by [name], MD on 07/27/2020 Stage prefix: Recurrence    04/23/2019 Initial Diagnosis   Malignant melanoma   -He has a history of left lower extremity melanoma in 2011, status post local excision -04/16/2019 patient underwent left groin mass resection  Resection pathology showed malignant melanoma, replacing a lymph node, with extracapsular extension, peripheral and deep margins involved.  Left inguinal contents, all 7 lymph nodes were negative for melanoma in the lymph nodes. Extranodal melanoma identified in lymphatic and interstitium between nodes -PDL1 80% TPS    07/07/2019 -  Radiation Therapy   status post adjuvant radiation.   07/23/2019 - 07/21/2021 Chemotherapy   Nivolumab q14d      06/29/2020 Imaging   CT chest abdomen pelvis showed stable postoperative appearance of the left groin.  No evidence of local recurrence.  No evidence of metastatic disease in the chest abdomen or pelvis.  Hepatic steatosis.  Stable subcentimeter fluid attenuation lesion of the lateral right lobe of the liver, likely benign cyst or hemangioma.  Coronary artery disease.  Aortic atherosclerosis   03/10/2021 Imaging   MRI brain without contrast showed no definitive evidence of intracranial metastatic disease.  Study is limited by absence of intravenous contrast.     04/26/2021 Imaging   CT chest abdomen pelvis without contrast showed stable post operative changes of left groin with no evidence of recurrent disease.  No evidence of metastatic disease in the chest abdomen pelvis.  Aortic atherosclerosis   08/08/2021 - 08/16/2021 Hospital Admission    patient was hospitalized due  to NSTEMI status post CABG x3.  He also had pacemaker The echocardiogram showed left ventricular ejection fraction of 65 to 70%,   09/15/2021 Imaging   CT chest abdomen pelvis w contrast   IMPRESSION: 1. Subtle hypodense 9 mm lesion in the left lobe of the liver is new from prior imaging including previous contrasted CT dating back to December 10, 2019, with the lesion appearing to equilibrate with background liver on delayed imaging sequence but is incompletely evaluated on this imaging study and technically nonspecific possibly reflecting a benign perfusional variant and while its appearance is not typical for that of a melanoma metastasis, it is not excluded on this examination. Suggest more definitive characterization by hepatic protocol MRI with and without contrast. 2. Stable postoperative changes in the left groin without evidence of local recurrent disease.3. No evidence of metastatic disease in the chest or pelvis.4.  Aortic Atherosclerosis (ICD10-I70.0).      09/23/2021 Imaging   Contrast-enhanced liver ultrasound Mildly hypoenhancing 2.2 cm mass in the posterior aspect of the left lobe of the liver with washout characteristics concerning for non hepatocellular malignancy, concerning for melanotic metastasis given history. The lesion is in an unfavorable location for percutaneous biopsy. Consider PET-CT for further characterization   10/20/2021 Imaging   CT chest abdomen pelvis showed stable postoperative/radiation appearance of the left groin.  No evidence of local recurrence/metastatic disease within the chest abdomen/pelvis.  Fatty liver disease.  Diverticulosis without evidence of typhlitis.  Aortic atherosclerosis   10/21/2021 -  Chemotherapy   Patient is on Treatment Plan : MELANOMA Nivolumab (1) + Ipilimumab (3) q21d / Nivolumab (240) q14d     10/21/2021 - 11/04/2021 Chemotherapy   Patient is on Treatment Plan : MELANOMA Nivolumab + Ipilimumab (1/3) q21d / Nivolumab q14d     11/06/2021 - 11/06/2021 Chemotherapy   Patient is on Treatment Plan : MELANOMA Nivolumab q14d     11/10/2021 Imaging   MRI Brain w wo contrast Negative for metastatic disease to the brain    11/10/2021 Imaging   MRI abdomen w wo contrast 1. Lesion of the posterior superior left lobe of the liver, hepatic segment II, measuring 1.9 x 1.8 cm corresponding to findings of prior imaging. Evaluation is somewhat limited breath motion artifact however there is subtle associated rim enhancement of this lesion. Findings are most in keeping with a hepatic metastasis in the setting of known recurrent melanoma. 2. Mild hepatic steatosis. 3. Cardiomegaly.     04/18/2021, patient establish care with neurology Dr. Mickeal Skinner for intermittent altered cognition.  he was recommended to start Vimpat.    INTERVAL HISTORY Edward Pitts is a 61 y.o. male who has above history reviewed by me today presents for follow up visit for management of inguinal nodal recurrence of melanoma, new liver lesion.  He reports no new complaints. Denies diarrhea, skin rash, abdominal pain.Spells are less common after his bypass surgery   :Review of Systems  Constitutional:  Negative for appetite change, chills, fatigue, fever and unexpected weight change.  HENT:   Negative for hearing loss and voice change.   Eyes:  Negative for eye problems and icterus.  Respiratory:  Negative for chest tightness, cough and shortness of breath.   Cardiovascular:  Positive for leg swelling. Negative for chest pain.  Gastrointestinal:  Negative for abdominal distention and abdominal pain.  Endocrine: Negative for hot flashes.  Genitourinary:  Negative for difficulty urinating, dysuria and frequency.   Musculoskeletal:        Status post left  hip replacement  Skin:  Negative for itching and rash.       Skin hypo-pigmentation on upper extremities, no change  Neurological:  Negative for light-headedness and numbness.       Spells  Hematological:  Negative for adenopathy. Does not bruise/bleed easily.  Psychiatric/Behavioral:  Negative for confusion.     MEDICAL HISTORY:  Past Medical History:  Diagnosis Date   Anemia    iron  treatments   Anxiety    Aortic atherosclerosis (HCC)    Arthritis    Cancer of groin ([place]) 2021   left groin, resected, radiation   Cataract    Complication of anesthesia    PONV   Coronary artery disease    Dizziness of unknown etiology    has led to seizures and passing out.   Family history of adverse reaction to anesthesia    PONV mother   GERD (gastroesophageal reflux disease)    History of complete heart block    PPM placed   Hyperlipidemia    Hypertension    LBBB (left bundle branch block)    Lymphedema of left leg    uses thigh high compression stockings   Melanoma ([place]) 2012   skin cancer, left thigh   OSA on CPAP    PONV (postoperative nausea and vomiting) 04/16/2019   Port-A-Cath in place    RIGHT chest wall   Presence of cardiac pacemaker    [employer]   Seizures ([place])    still has episodes of dizziness. last event 1 month ago (march 2022) and will pass out. takes clonazepam    SURGICAL HISTORY: Past Surgical History:  Procedure Laterality Date   CORONARY ARTERY BYPASS GRAFT N/A 08/10/2021   Procedure: CORONARY ARTERY BYPASS GRAFTING (CABG) X 3 USING LEFT INTERNAL MAMMARY ARTERY AND RIGHT GREATER SAPHENOUS VEIN;  Surgeon: Dahlia Byes, MD;  Location: [place];  Service: Open Heart Surgery;  Laterality: N/A;   CT RADIATION THERAPY GUIDE     left groin   dental implant     permanent implant   ENDOVEIN HARVEST OF GREATER SAPHENOUS VEIN Right 08/10/2021   Procedure: ENDOVEIN HARVEST OF GREATER SAPHENOUS VEIN;  Surgeon: Dahlia Byes, MD;  Location: [place];  Service: Open Heart Surgery;  Laterality: Right;   KNEE SURGERY Left    arthroscopy   LEFT HEART CATH AND CORONARY ANGIOGRAPHY Left 06/29/2017   Procedure: LEFT HEART CATH AND CORONARY ANGIOGRAPHY;  Surgeon: [name], MD;  Location: [place] [facility];  Service: Cardiovascular;  Laterality: Left;   LEFT HEART CATH AND CORONARY ANGIOGRAPHY N/A 08/08/2021   Procedure: LEFT HEART CATH AND CORONARY  ANGIOGRAPHY;  Surgeon: [name], MD;  Location: [place] [facility];  Service: Cardiovascular;  Laterality: N/A;   LYMPH NODE DISSECTION Left 04/16/2019   Procedure: Left inguinal Lymph Node Dissection;  Surgeon: [name], MD;  Location: [place];  Service: General;  Laterality: Left;   MELANOMA EXCISION Left 04/16/2019   Procedure: MELANOMA EXCISION LEFT GROIN MASS;  Surgeon: [name], MD;  Location: [place];  Service: General;  Laterality: Left;   MELANOMA EXCISION WITH SENTINEL LYMPH NODE BIOPSY Left 2012   Left calf    PACEMAKER INSERTION N/A 08/26/2018   Procedure: INSERTION PACEMAKER;  Surgeon: [name], MD;  Location: [facility];  Service: Cardiovascular;  Laterality: N/A;   PORTA CATH INSERTION N/A 08/26/2019   Procedure: PORTA CATH INSERTION;  Surgeon: Katha Cabal, MD;  Location: [place] [facility];  Service: Cardiovascular;  Laterality: N/A;   SUPERFICIAL LYMPH NODE BIOPSY / EXCISION Left 2020   lymph nodes removed around left groin melanoma site   TEE WITHOUT CARDIOVERSION N/A 08/10/2021   Procedure: TRANSESOPHAGEAL ECHOCARDIOGRAM (TEE);  Surgeon: Dahlia Byes, MD;  Location: [place];  Service: Open Heart Surgery;  Laterality: N/A;   TEMPORARY PACEMAKER N/A 08/25/2018   Procedure: TEMPORARY PACEMAKER;  Surgeon: [name], MD;  Location: [place] [facility];  Service: Cardiovascular;  Laterality: N/A;   TOTAL HIP ARTHROPLASTY Left 07/14/2020   Procedure: TOTAL HIP ARTHROPLASTY;  Surgeon: Dereck Leep, MD;  Location: [facility];  Service: Orthopedics;  Laterality: Left;    SOCIAL HISTORY: Social History   Socioeconomic History   Marital status: Married    Spouse name: Vicente Males    Number of children: 7   Years of education: 12   Highest education level: Not on file  Occupational History    Comment: disability  Tobacco Use   Smoking status: Never   Smokeless tobacco: Never  Vaping Use   Vaping Use: Never used  Substance and Sexual Activity    Alcohol use: No   Drug use: No   Sexual activity: Not Currently  Other Topics Concern   Not on file  Social History Narrative   Lives with  Wife,   Has 2 small dogs   Caffeine use: sodas (2 per day)      Out of work on disability.  Has a walk in shower. No stairs to climb   Oncology treatment ongoing. Uses port a cath for treatment.      pacemaker   Social Determinants of Health   Financial Resource Strain: Low Risk  (02/12/2019)   Overall Financial Resource Strain (CARDIA)    Difficulty of Paying Living Expenses: Not hard at all  Food Insecurity: No Food Insecurity (02/12/2019)   Hunger Vital Sign    Worried About Running Out of Food in the Last Year: Never true    Ran Out of Food in the Last Year: Never true  Transportation Needs: Unmet Transportation Needs (02/12/2019)   PRAPARE - Hydrologist (Medical): Yes    Lack of Transportation (Non-Medical): Yes  Physical Activity: Unknown (02/12/2019)   Exercise Vital Sign    Days of Exercise per Week: 0 days    Minutes of Exercise per Session: Not on file  Stress: No Stress Concern Present (02/12/2019)   [place]    Feeling of Stress : Only a little  Social Connections: Unknown (02/12/2019)   Social Connection and Isolation Panel [NHANES]    Frequency of Communication with Friends and Family: More than three times a week    Frequency of Social Gatherings with Friends and Family: Not on file    Attends Religious Services: Not on file    Active Member of Clubs or Organizations: Not on file    Attends Archivist Meetings: Not on file    Marital Status: Married  Intimate Partner Violence: Not At Risk (02/12/2019)   Humiliation, Afraid, Rape, and Kick questionnaire    Fear of Current or Ex-Partner: No    Emotionally Abused: No    Physically Abused: No    Sexually Abused: No    FAMILY HISTORY: Family History  Problem  Relation Age of Onset   Cancer Paternal Grandmother     ALLERGIES:  is allergic to ibuprofen, levetiracetam, and nsaids.  MEDICATIONS:  Current Outpatient Medications  Medication Sig  Dispense Refill   aspirin 81 MG EC tablet Take 81 mg by mouth daily.     atorvastatin (LIPITOR) 20 MG tablet Take 20 mg by mouth daily.     Carboxymeth-Glyc-Polysorb PF (REFRESH OPTIVE MEGA-3) 0.5-1-0.5 % SOLN Place 1 drop into both eyes daily as needed (dry eyes).     clonazePAM (KLONOPIN) 0.5 MG tablet TAKE 0.$RemoveBefor'5MG'kpdmRlWMREiV$  IN AM, $Remo'1MG'CGpGS$  IN PM 90 tablet 0   febuxostat (ULORIC) 40 MG tablet Take 40 mg by mouth daily.     ferrous sulfate 325 (65 FE) MG EC tablet Take 1 tablet (325 mg total) by mouth 2 (two) times daily with a meal. 60 tablet 3   hydrochlorothiazide (HYDRODIURIL) 25 MG tablet      hydrocortisone 2.5 % ointment Apply 1 application. topically daily as needed (itching).     Lacosamide (VIMPAT) 100 MG TABS TAKE 1 TABLET (100 MG TOTAL) BY MOUTH IN THE MORNING AND AT BEDTIME. 60 tablet 3   lidocaine-prilocaine (EMLA) cream Apply 1 application. topically as needed. Apply small amount of cream to port site approx 1-2 hours prior to appointment. 30 g 11   lisinopril (ZESTRIL) 20 MG tablet Take 20 mg by mouth daily.     LORazepam (ATIVAN) 2 MG tablet TAKE 1 TABLET (2 MG TOTAL) BY MOUTH EVERY 8 (EIGHT) HOURS AS NEEDED FOR SEIZURE. 20 tablet 0   metoprolol succinate (TOPROL-XL) 25 MG 24 hr tablet Take 25 mg by mouth daily.     Multiple Vitamin (MULTIVITAMIN WITH MINERALS) TABS tablet Take 1 tablet by mouth daily. Centrum Silver     omeprazole (PRILOSEC) 20 MG capsule TAKE 1 CAPSULE BY MOUTH EVERY DAY 90 capsule 0   ondansetron (ZOFRAN) 4 MG tablet TAKE 1 TABLET BY MOUTH EVERY 8 HOURS AS NEEDED FOR NAUSEA AND VOMITING 90 tablet 1   No current facility-administered medications for this visit.   Facility-Administered Medications Ordered in Other Visits  Medication Dose Route Frequency Provider Last Rate Last Admin    heparin lock flush 100 UNIT/ML injection              PHYSICAL EXAMINATION: ECOG PERFORMANCE STATUS: 1 - Symptomatic but completely ambulatory Vitals:   12/02/21 0942  BP: (!) 139/99  Pulse: 67  Resp: 18  Temp: (!) 96 F (35.6 C)   Filed Weights   12/02/21 0942  Weight: 241 lb 6.4 oz (109.5 kg)    Physical Exam Constitutional:      General: He is not in acute distress.    Comments: Patient ambulates independently  HENT:     Head: Normocephalic and atraumatic.  Eyes:     General: No scleral icterus.    Pupils: Pupils are equal, round, and reactive to light.  Cardiovascular:     Rate and Rhythm: Normal rate and regular rhythm.     Heart sounds: Normal heart sounds.  Pulmonary:     Effort: Pulmonary effort is normal. No respiratory distress.     Breath sounds: No wheezing.  Abdominal:     General: Bowel sounds are normal. There is no distension.     Palpations: Abdomen is soft. There is no mass.     Tenderness: There is no abdominal tenderness.     Comments:    Musculoskeletal:        General: No deformity. Normal range of motion.     Cervical back: Normal range of motion and neck supple.     Comments: Left lower extremity edema-chronic   Skin:  General: Skin is warm and dry.  Neurological:     Mental Status: He is alert and oriented to person, place, and time. Mental status is at baseline.     Cranial Nerves: No cranial nerve deficit.     Coordination: Coordination normal.  Psychiatric:        Mood and Affect: Mood normal.     LABORATORY DATA:  I have reviewed the data as listed    Latest Ref Rng & Units 12/02/2021    9:16 AM 11/18/2021    8:37 AM 11/04/2021    9:06 AM  CBC  WBC 4.0 - 10.5 K/uL 5.1  5.0  6.3   Hemoglobin 13.0 - 17.0 g/dL 11.2  11.2  10.4   Hematocrit 39.0 - 52.0 % 33.8  34.2  31.5   Platelets 150 - 400 K/uL 189  205  203       Latest Ref Rng & Units 12/02/2021    9:16 AM 11/18/2021    8:37 AM 11/04/2021    9:06 AM  CMP  Glucose 70 -  99 mg/dL 137  120  125   BUN 8 - 23 mg/dL 40  36  48   Creatinine 0.61 - 1.24 mg/dL 1.72  1.51  2.07   Sodium 135 - 145 mmol/L 139  137  139   Potassium 3.5 - 5.1 mmol/L 4.5  4.3  4.1   Chloride 98 - 111 mmol/L 113  109  109   CO2 22 - 32 mmol/L $RemoveB'21  20  20   'cyZqyBtw$ Calcium 8.9 - 10.3 mg/dL 8.9  8.9  8.9   Total Protein 6.5 - 8.1 g/dL 6.8  7.4  7.3   Total Bilirubin 0.3 - 1.2 mg/dL 0.4  0.5  0.2   Alkaline Phos 38 - 126 U/L 76  89  89   AST 15 - 41 U/L $Remo'28  25  21   'IKEcw$ ALT 0 - 44 U/L 35  28  24      RADIOGRAPHIC STUDIES: I have personally reviewed the radiological images as listed and agreed with the findings in the report. MR Brain W Wo Contrast  Result Date: 11/12/2021 CLINICAL DATA:  Melanoma staging EXAM: MRI HEAD WITHOUT AND WITH CONTRAST TECHNIQUE: Multiplanar, multiecho pulse sequences of the brain and surrounding structures were obtained without and with intravenous contrast. CONTRAST:  82mL GADAVIST GADOBUTROL 1 MMOL/ML IV SOLN COMPARISON:  10/06/2021 PET-CT FINDINGS: Brain: No swelling or enhancement to suggest metastatic disease. No central infarct, hemorrhage, hydrocephalus, or collection. Vascular: Normal flow voids and vascular enhancements. Skull and upper cervical spine: Negative for marrow lesion. Sinuses/Orbits: Negative. Other: The left parotid mass by PET CT is not well assessed on this brain MRI. IMPRESSION: Negative for metastatic disease to the brain. Electronically Signed   By: Jorje Guild M.D.   On: 11/12/2021 19:00   MR Abdomen W Wo Contrast  Result Date: 11/12/2021 CLINICAL DATA:  Malignant melanoma, left inguinal nodal recurrence, evaluate suspicious and FDG avid left lobe liver lesion EXAM: MRI ABDOMEN WITHOUT AND WITH CONTRAST TECHNIQUE: Multiplanar multisequence MR imaging of the abdomen was performed both before and after the administration of intravenous contrast. CONTRAST:  54mL GADAVIST GADOBUTROL 1 MMOL/ML IV SOLN COMPARISON:  PET-CT, 10/06/2021, CT chest abdomen  pelvis, 09/15/2021, contrast enhanced ultrasound, 09/23/2021 FINDINGS: Lower chest: No acute abnormality.  Cardiomegaly. Hepatobiliary: Mild hepatic steatosis. Subtly T2 hyperintense lesion of the posterior superior left lobe of the liver, hepatic segment II, measuring 1.9 x 1.8 cm (series  5, image 15). Evaluation is somewhat limited in this vicinity, particularly on contrast enhanced sequences, however there is subtle associated rim enhancement of this lesion (e.g. Series 16, image 37). Simple, benign fluid signal cysts of the right lobe of the liver measuring up to 0.7 cm (series 5, image 21). No gallstones, gallbladder wall thickening, or biliary dilatation. Pancreas: Unremarkable. No pancreatic ductal dilatation or surrounding inflammatory changes. Spleen: Normal in size without significant abnormality. Adrenals/Urinary Tract: Adrenal glands are unremarkable. Simple, benign bilateral fluid signal renal cortical cysts for which no further follow-up or characterization is required. Kidneys are otherwise normal, without renal calculi, solid lesion, or hydronephrosis. Stomach/Bowel: Stomach is within normal limits. No evidence of bowel wall thickening, distention, or inflammatory changes. Vascular/Lymphatic: No significant vascular findings are present. No enlarged abdominal lymph nodes. Other: No abdominal wall hernia or abnormality. No ascites. Musculoskeletal: No acute or significant osseous findings. IMPRESSION: 1. Lesion of the posterior superior left lobe of the liver, hepatic segment II, measuring 1.9 x 1.8 cm corresponding to findings of prior imaging. Evaluation is somewhat limited breath motion artifact however there is subtle associated rim enhancement of this lesion. Findings are most in keeping with a hepatic metastasis in the setting of known recurrent melanoma. 2. Mild hepatic steatosis. 3. Cardiomegaly. Electronically Signed   By: Delanna Ahmadi M.D.   On: 11/12/2021 13:29   NM PET Image Restage  (PS) Whole Body  Result Date: 10/08/2021 CLINICAL DATA:  Subsequent treatment strategy for metastatic melanoma with left inguinal nodal recurrence. New liver lesion. EXAM: NUCLEAR MEDICINE PET WHOLE BODY TECHNIQUE: 13.2 mCi F-18 FDG was injected intravenously. Full-ring PET imaging was performed from the head to foot after the radiotracer. CT data was obtained and used for attenuation correction and anatomic localization. Fasting blood glucose: 106 mg/dl COMPARISON:  PET-CT 02/11/2019. CT of the chest, abdomen and pelvis 09/15/2021. FINDINGS: Mediastinal blood pool activity: SUV max 3.2 HEAD/ NECK: There is a small hypermetabolic nodule posteriorly in the superficial portion of the left parotid gland (SUV max 3.4). This appears similar to the previous PET-CT and is likely an incidental parotid lesion. No other hypermetabolic cervical lymph nodes are seen.There are no lesions of the pharyngeal mucosal space. Incidental CT findings: none CHEST: There are no hypermetabolic mediastinal, hilar or axillary lymph nodes. No hypermetabolic pulmonary activity or suspicious nodularity. Incidental CT findings: There is hypermetabolic activity within the median sternotomy, likely physiologic. The median sternotomy has incompletely healed, but demonstrates no dehiscence. Right IJ Port-A-Cath and left subclavian pacemaker leads are in place. ABDOMEN/PELVIS: There is focal hypermetabolic activity in the area of concern within the left hepatic lobe. The new lesion seen on CT is not well demonstrated on the CT images, but there is focal hypermetabolic activity in this area (SUV max 4.5). No other hypermetabolic liver lesions are seen. There is no hypermetabolic activity within the adrenal glands, pancreas or spleen. There is no hypermetabolic nodal activity. Previously demonstrated large left nodal mass has been surgically removed, and no residual hypermetabolic activity is present in this area. Incidental CT findings: Stable  umbilical hernia containing only fat, colonic diverticulosis and aortic atherosclerosis. SKELETON: There is no hypermetabolic activity to suggest osseous metastatic disease. Incidental CT findings: none EXTREMITIES: There is low-level hypermetabolic activity within the subcutaneous fat medial to the left knee, corresponding with surgical clips and attributed to previous saphenous venous harvesting for CABG. Correlate clinically. No other suspicious soft tissue activity within the extremities. Incidental CT findings: none IMPRESSION: 1. The new lesion  of concern in the left hepatic lobe is not optimally evaluated based on small size, but does demonstrate hypermetabolic activity, suspicious for metastatic disease. Assuming the patient is unable to undergo MRI due to his pacemaker, recommend attention on follow-up CT in 3 months. 2. No other evidence of metastatic disease. 3. 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 Electronically Signed   By: Richardean Sale M.D.   On: 10/08/2021 15:01   US LIVER W/CM 1ST LESION  Result Date: 09/23/2021 CLINICAL DATA:  61 year old male with history of melanoma with development of new left lobe lesion measuring up to 9 mm on recent staging CT. The patient presents for contrast enhanced ultrasound for further characterization. EXAM: US LIVER WITH CONTRAST CONTRAST:  2.4 mL sulfur hexafluoride Number of injections: 1 COMPARISON:  09/15/2021 FINDINGS: LIVER Size: Normal. Echogenicity: Within normal limits. Surface Contour: Smooth. Observation 1 Lobe: Left Size: 2.2 cm Grayscale features: Hypoechoic with isoechoic center. Arterial phase enhancement features: Mildly hypoenhancing Onset of washout: 60 seconds Degree of washout: Marked Tumor in vein: No CEUS LI-RADS category: Not applicable in the absence of cirrhosis. 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 The lesion is in an unfavorable location for percutaneous biopsy. Consider PET-CT for further characterization. Ruthann Cancer, MD [facility] Electronically Signed   By: Ruthann Cancer M.D.   On: 09/23/2021 15:39   CT CHEST ABDOMEN PELVIS W CONTRAST  Result Date: 09/15/2021 CLINICAL DATA:  History of melanoma, follow-up. * Tracking Code: BO * EXAM: CT CHEST, ABDOMEN, AND PELVIS WITH CONTRAST TECHNIQUE: Multidetector CT imaging of the chest, abdomen and pelvis was performed following the standard protocol during bolus administration of intravenous contrast. RADIATION DOSE REDUCTION: This exam was performed according to the departmental dose-optimization program which includes automated exposure control, adjustment of the mA and/or kV according to patient size and/or use of iterative reconstruction technique. CONTRAST:  82mL OMNIPAQUE IOHEXOL 300 MG/ML  SOLN COMPARISON:  Multiple priors including most recent CT April 26, 2021 FINDINGS: CT CHEST FINDINGS Cardiovascular: Accessed right chest Port-A-Cath with tip near the superior cavoatrial junction. Left chest cardiac conduction device with leads in the right atrium and right ventricle. Interval median sternotomy and CABG. Aortic atherosclerosis without aneurysmal dilation. No central pulmonary embolus on this nondedicated study. Normal size heart. Trace pericardial effusion there are postoperative. Mediastinum/Nodes: Mild stranding in the anterior mediastinum most likely reflect sequela of interval CABG. Hypodense 15 mm nodule in the right lobe of the thyroid on image 6/2. No pathologically enlarged mediastinal, hilar or axillary lymph nodes. Esophagus is grossly unremarkable. Lungs/Pleura: No suspicious pulmonary nodules or masses. No focal airspace consolidation. No pleural effusion.  No pneumothorax. Musculoskeletal: No aggressive lytic or blastic lesion of bone. Median sternotomy wires are intact. Anterior chest wall incision. Bilateral gynecomastia. CT ABDOMEN PELVIS FINDINGS Hepatobiliary: Subtle 9 mm hypodense lesion in the left lobe of the liver on image 55/2 is new from prior imaging including contrasted CTs dating back to December 10, 2019, with the lesion appearing to equilibrate to background liver on delayed imaging. Similar tiny low-density lesion in the right lobe of the liver are too small to accurately characterize but stable over multiple priors and favored to reflect a cyst. No solid enhancing hepatic lesion. Gallbladder is unremarkable. No biliary ductal dilation. Pancreas: No pancreatic  ductal dilation or evidence of acute inflammation. Spleen: No splenomegaly or focal splenic lesion. Adrenals/Urinary Tract: Bilateral adrenal glands appear normal. No hydronephrosis. Hypodense 2 cm right renal cyst is considered benign requiring no independent imaging follow-up. Urinary bladder is unremarkable for degree of distension within limitation of streak artifact from left hip arthroplasty. Stomach/Bowel: Radiopaque enteric contrast material traverses the splenic flexure. Stomach is unremarkable for degree of distension. No pathologic dilation of small or large bowel. Left-sided colonic diverticulosis without findings of acute diverticulitis. No evidence of acute bowel inflammation. Vascular/Lymphatic: Aortic atherosclerosis without abdominal aortic aneurysm. No pathologically enlarged abdominal or pelvic lymph nodes. Reproductive: Prostate is unremarkable. Other: Similar postsurgical changes in the left groin without new enhancing soft tissue in the surgical bed. Moderate-sized fat containing ventral hernia. Musculoskeletal: Prior left total hip arthroplasty. No aggressive lytic or blastic lesion of bone. IMPRESSION: 1. Subtle hypodense 9 mm lesion in the left lobe of the liver is new  from prior imaging including previous contrasted CT dating back to December 10, 2019, with the lesion appearing to equilibrate with background liver on delayed imaging sequence but is incompletely evaluated on this imaging study and technically nonspecific possibly reflecting a benign perfusional variant and while its appearance is not typical for that of a melanoma metastasis, it is not excluded on this examination. Suggest more definitive characterization by hepatic protocol MRI with and without contrast. 2. Stable postoperative changes in the left groin without evidence of local recurrent disease. 3. No evidence of metastatic disease in the chest or pelvis. 4.  Aortic Atherosclerosis (ICD10-I70.0). Electronically Signed   By: Dahlia Bailiff M.D.   On: 09/15/2021 16:33   DG Chest 2 View  Result Date: 09/14/2021 CLINICAL DATA:  Status post coronary artery bypass grafts EXAM: CHEST - 2 VIEW COMPARISON:  08/14/2021 FINDINGS: Transverse diameter of heart is slightly increased. There is previous coronary artery bypass surgery. Pacemaker battery is seen in the left infraclavicular region with tips of leads in right atrium and right ventricle. Tip of right IJ chest port is seen in the superior vena cava. IMPRESSION: There are no new infiltrates or signs of pulmonary edema. Electronically Signed   By: Elmer Picker M.D.   On: 09/14/2021 13:49

## 2021-12-02 NOTE — Progress Notes (Signed)
Cardiac Individual Treatment Plan  Patient Details  Name: Torey Reinard MRN: [id number] Date of Birth: 11-Jul-1960 Referring Provider:   Flowsheet Row Cardiac Rehab from 09/19/2021 in [facility] Cardiac and Pulmonary Rehab  Referring Provider Serafina Royals MD       Initial Encounter Date:  Flowsheet Row Cardiac Rehab from 09/19/2021 in [facility] Cardiac and Pulmonary Rehab  Date 09/19/21       Visit Diagnosis: S/P CABG x 3  Patient's Home Medications on Admission:  Current Outpatient Medications:    aspirin 81 MG EC tablet, Take 81 mg by mouth daily., Disp: , Rfl:    atorvastatin (LIPITOR) 20 MG tablet, Take 20 mg by mouth daily., Disp: , Rfl:    Carboxymeth-Glyc-Polysorb PF (REFRESH OPTIVE MEGA-3) 0.5-1-0.5 % SOLN, Place 1 drop into both eyes daily as needed (dry eyes)., Disp: , Rfl:    clonazePAM (KLONOPIN) 0.5 MG tablet, TAKE 0.$RemoveBefore'5MG'jYqIpoPuqhxRC$  IN AM, $Remo'1MG'acAea$  IN PM, Disp: 90 tablet, Rfl: 0   febuxostat (ULORIC) 40 MG tablet, Take 40 mg by mouth daily., Disp: , Rfl:    ferrous sulfate 325 (65 FE) MG EC tablet, Take 1 tablet (325 mg total) by mouth 2 (two) times daily with a meal., Disp: 60 tablet, Rfl: 3   hydrochlorothiazide (HYDRODIURIL) 25 MG tablet, , Disp: , Rfl:    HYDROcodone-acetaminophen (NORCO/VICODIN) 5-325 MG tablet, , Disp: , Rfl:    hydrocortisone 2.5 % ointment, Apply 1 application. topically daily as needed (itching)., Disp: , Rfl:    Lacosamide (VIMPAT) 100 MG TABS, TAKE 1 TABLET (100 MG TOTAL) BY MOUTH IN THE MORNING AND AT BEDTIME., Disp: 60 tablet, Rfl: 3   lidocaine-prilocaine (EMLA) cream, Apply 1 application. topically as needed. Apply small amount of cream to port site approx 1-2 hours prior to appointment., Disp: 30 g, Rfl: 11   lisinopril (ZESTRIL) 20 MG tablet, Take 20 mg by mouth daily., Disp: , Rfl:    LORazepam (ATIVAN) 2 MG tablet, TAKE 1 TABLET (2 MG TOTAL) BY MOUTH EVERY 8 (EIGHT) HOURS AS NEEDED FOR SEIZURE., Disp: 20 tablet, Rfl: 0   metoprolol succinate (TOPROL-XL) 25 MG  24 hr tablet, Take 25 mg by mouth daily., Disp: , Rfl:    Multiple Vitamin (MULTIVITAMIN WITH MINERALS) TABS tablet, Take 1 tablet by mouth daily. Centrum Silver, Disp: , Rfl:    omeprazole (PRILOSEC) 20 MG capsule, TAKE 1 CAPSULE BY MOUTH EVERY DAY, Disp: 90 capsule, Rfl: 0   ondansetron (ZOFRAN) 4 MG tablet, TAKE 1 TABLET BY MOUTH EVERY 8 HOURS AS NEEDED FOR NAUSEA AND VOMITING, Disp: 90 tablet, Rfl: 1 No current facility-administered medications for this visit.  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   Family history of adverse reaction to anesthesia    PONV mother   GERD (gastroesophageal reflux disease)    History of complete heart block    PPM placed   Hyperlipidemia    Hypertension    LBBB (left bundle branch block)  Lymphedema of left leg    uses thigh high compression stockings   Melanoma ([place]) 2012   skin cancer, left thigh   OSA on CPAP    PONV (postoperative nausea and vomiting) 04/16/2019   Port-A-Cath in place    RIGHT chest wall   Presence of cardiac pacemaker    [employer]   Seizures ([place])    still has episodes of dizziness. last event 1 month ago (march 2022) and will pass out. takes clonazepam    Tobacco Use: Social History   Tobacco Use  Smoking Status Never  Smokeless Tobacco Never    Labs: Review Flowsheet  More data exists      Latest Ref Rng & Units 08/06/2021 08/09/2021 08/10/2021 08/11/2021 08/12/2021  Labs for ITP Cardiac and Pulmonary Rehab  Cholestrol 0 - 200 mg/dL 156  - - - -  LDL  (calc) 0 - 99 mg/dL 91  - - - -  HDL-C >40 mg/dL 34  - - - -  Trlycerides <150 mg/dL 153  - - - -  Hemoglobin A1c 4.8 - 5.6 % - 5.7  - - -  PH, Arterial 7.35 - 7.45 - 7.39  7.340  7.331  7.292  7.363  7.370  7.370  7.385  7.341  7.370  -  PCO2 arterial 32 - 48 mmHg - 40  40.0  43.8  49.7  41.0  41.4  44.9  41.9  43.3  27.6  -  Bicarbonate 20.0 - 28.0 mmol/L - 24.2  21.6  23.6  24.0  23.3  23.9  26.0  25.3  25.1  23.4  16.0  -  TCO2 22 - 32 mmol/L - - $R'23  25  26  23  25  25  25  25  27  28  27  26  23  23  25  17  'JJ$ -  Acid-base deficit 0.0 - 2.0 mmol/L - 0.7  4.0  3.0  3.0  2.0  1.0  2.0  8.0  -  O2 Saturation % - 97.7  98  95  96  100  100  100  70  100  98  91  72.9  58.7      Exercise Target Goals: Exercise Program Goal: Individual exercise prescription set using results from initial 6 min walk test and THRR while considering  patient's activity barriers and safety.   Exercise Prescription Goal: Initial exercise prescription builds to 30-45 minutes a day of aerobic activity, 2-3 days per week.  Home exercise guidelines will be given to patient during program as part of exercise prescription that the participant will acknowledge.   Education: Aerobic Exercise: - Group verbal and visual presentation on the components of exercise prescription. Introduces F.I.T.T principle from ACSM for exercise prescriptions.  Reviews F.I.T.T. principles of aerobic exercise including progression. Written material given at graduation. Flowsheet Row Cardiac Rehab from 11/30/2021 in [facility] Cardiac and Pulmonary Rehab  Education need identified 09/19/21  Date 11/30/21  Educator [facility]  Instruction Review Code 1- Verbalizes Understanding       Education: Resistance Exercise: - Group verbal and visual presentation on the components of exercise prescription. Introduces F.I.T.T principle from ACSM for exercise prescriptions  Reviews F.I.T.T. principles of resistance exercise including progression. Written material  given at graduation.    Education: Exercise & Equipment Safety: - Individual verbal instruction and demonstration of equipment use and safety with use of the equipment. Flowsheet Row Cardiac Rehab from 11/30/2021 in [facility] Cardiac and Pulmonary  Rehab  Date 09/14/21  Educator [facility]  Instruction Review Code 1- Verbalizes Understanding       Education: Exercise Physiology & General Exercise Guidelines: - Group verbal and written instruction with models to review the exercise physiology of the cardiovascular system and associated critical values. Provides general exercise guidelines with specific guidelines to those with heart or lung disease.  Flowsheet Row Cardiac Rehab from 11/30/2021 in [facility] Cardiac and Pulmonary Rehab  Date 09/21/21  Educator [facility]  Instruction Review Code 1- [employer] Understanding       Education: Flexibility, Balance, Mind/Body Relaxation: - Group verbal and visual presentation with interactive activity on the components of exercise prescription. Introduces F.I.T.T principle from ACSM for exercise prescriptions. Reviews F.I.T.T. principles of flexibility and balance exercise training including progression. Also discusses the mind body connection.  Reviews various relaxation techniques to help reduce and manage stress (i.e. Deep breathing, progressive muscle relaxation, and visualization). Balance handout provided to take home. Written material given at graduation. Flowsheet Row Cardiac Rehab from 11/30/2021 in [facility] Cardiac and Pulmonary Rehab  Date 11/30/21  Educator [facility]  Instruction Review Code 1- Verbalizes Understanding       Activity Barriers & Risk Stratification:  Activity Barriers & Cardiac Risk Stratification - 09/19/21 1145       Activity Barriers & Cardiac Risk Stratification   Activity Barriers Deconditioning;Muscular Weakness;Shortness of Breath;Balance Concerns;Other (comment)    Comments compression sock on left leg for swelling    Cardiac Risk  Stratification High             6 Minute Walk:  6 Minute Walk     Row Name 09/19/21 1143 11/21/21 0816       6 Minute Walk   Phase Initial Discharge    Distance 1042 feet 1345 feet    Distance % Change -- 29.1 %    Distance Feet Change -- 303 ft    Walk Time 6 minutes 6 minutes    # of Rest Breaks 0 0    MPH 1.97 2.55    METS 2.43 3.13    RPE 9 11    Perceived Dyspnea  1 --    VO2 Peak 8.51 10.97    Symptoms Yes (comment) No    Comments SOB --    Resting HR 61 bpm 63 bpm    Resting BP 128/74 102/62    Resting Oxygen Saturation  95 % 95 %    Exercise Oxygen Saturation  during 6 min walk 96 % 96 %    Max Ex. HR 106 bpm 107 bpm    Max Ex. BP 126/74 146/74    2 Minute Post BP 124/62 --             Oxygen Initial Assessment:   Oxygen Re-Evaluation:   Oxygen Discharge (Final Oxygen Re-Evaluation):   Initial Exercise Prescription:  Initial Exercise Prescription - 09/19/21 1100       Date of Initial Exercise RX and Referring Provider   Date 09/19/21    Referring Provider Arnoldo Hooker MD      Oxygen   Maintain Oxygen Saturation 88% or higher      Treadmill   MPH 1.9    Grade 0    Minutes 15    METs 2.45      Recumbant Bike   Level 1    RPM 50    Watts 15    Minutes 15    METs 2.5      NuStep  Level 1    SPM 80    Minutes 15    METs 2.5      Track   Laps 26    Minutes 15    METs 2.41      Prescription Details   Frequency (times per week) 3    Duration Progress to 30 minutes of continuous aerobic without signs/symptoms of physical distress      Intensity   THRR 40-80% of Max Heartrate 100-139    Ratings of Perceived Exertion 11-13    Perceived Dyspnea 0-4      Progression   Progression Continue to progress workloads to maintain intensity without signs/symptoms of physical distress.      Resistance Training   Training Prescription Yes    Weight 4 lb    Reps 10-15             Perform Capillary Blood Glucose checks as  needed.  Exercise Prescription Changes:   Exercise Prescription Changes     Row Name 09/19/21 1100 10/03/21 0900 10/17/21 1000 10/19/21 0800 10/31/21 0800     Response to Exercise   Blood Pressure (Admit) 128/74 128/76 126/64 -- 124/68   Blood Pressure (Exercise) 126/74 160/94 126/70 -- --   Blood Pressure (Exit) 124/62 118/62 124/70 -- 108/56   Heart Rate (Admit) 61 bpm 56 bpm 69 bpm -- 63 bpm   Heart Rate (Exercise) 106 bpm 100 bpm 108 bpm -- 112 bpm   Heart Rate (Exit) 76 bpm 72 bpm 71 bpm -- 64 bpm   Oxygen Saturation (Admit) 95 % -- -- -- --   Oxygen Saturation (Exercise) 96 % -- -- -- --   Rating of Perceived Exertion (Exercise) 9 13 15  -- 15   Perceived Dyspnea (Exercise) 1 -- -- -- --   Symptoms SOB none none -- none   Comments walk test results -- -- -- --   Duration -- Continue with 30 min of aerobic exercise without signs/symptoms of physical distress. Continue with 30 min of aerobic exercise without signs/symptoms of physical distress. -- Continue with 30 min of aerobic exercise without signs/symptoms of physical distress.   Intensity -- THRR unchanged THRR unchanged -- THRR unchanged     Progression   Progression -- Continue to progress workloads to maintain intensity without signs/symptoms of physical distress. Continue to progress workloads to maintain intensity without signs/symptoms of physical distress. -- Continue to progress workloads to maintain intensity without signs/symptoms of physical distress.   Average METs -- 2.41 2.91 -- 3.34     Resistance Training   Training Prescription -- Yes Yes -- Yes   Weight -- 4 lb 4 lb -- 5 lb   Reps -- 10-15 10-15 -- 10-15     Interval Training   Interval Training -- No No -- No     Recumbant Bike   Level -- -- 3 -- 3   Watts -- -- 38 -- 38   Minutes -- -- 15 -- 15   METs -- -- 3.12 -- 3.11     NuStep   Level -- 6 6 -- 7   Minutes -- 15 15 -- 15   METs -- 2.5 3.3 -- 4.1     REL-XR   Level -- 3 -- -- --    Minutes -- 15 -- -- --   METs -- 2.1 -- -- --     Track   Laps -- 30 30 -- 35   Minutes -- 15 15 -- 15  METs -- 2.63 2.63 -- 2.9     Home Exercise Plan   Plans to continue exercise at -- -- -- Home (comment)  walking, staff videos Home (comment)  walking, staff videos   Frequency -- -- -- Add 2 additional days to program exercise sessions. Add 2 additional days to program exercise sessions.   Initial Home Exercises Provided -- -- -- 10/19/21 10/19/21     Oxygen   Maintain Oxygen Saturation -- 88% or higher 88% or higher -- 88% or higher    Row Name 11/14/21 0800 11/30/21 1400           Response to Exercise   Blood Pressure (Admit) 102/60 112/64      Blood Pressure (Exit) 110/62 122/60      Heart Rate (Admit) 62 bpm 62 bpm      Heart Rate (Exercise) 112 bpm 107 bpm      Heart Rate (Exit) 72 bpm 72 bpm      Rating of Perceived Exertion (Exercise) 15 15      Symptoms none none      Duration Continue with 30 min of aerobic exercise without signs/symptoms of physical distress. Continue with 30 min of aerobic exercise without signs/symptoms of physical distress.      Intensity THRR unchanged THRR unchanged        Progression   Progression Continue to progress workloads to maintain intensity without signs/symptoms of physical distress. Continue to progress workloads to maintain intensity without signs/symptoms of physical distress.      Average METs 3.38 3.63        Resistance Training   Training Prescription Yes Yes      Weight 5 lb 5 lb      Reps 10-15 10-15        Interval Training   Interval Training No No        Treadmill   MPH 2 --      Grade 0 --      Minutes 15 --      METs 2.53 --        NuStep   Level 8 7      Minutes 15 15      METs 4.7 5        Biostep-RELP   Level -- 6      Minutes -- 15      METs -- 4        Track   Laps 30 30      Minutes 15 15      METs 2.63 2.63        Home Exercise Plan   Plans to continue exercise at Home (comment)   walking, staff videos Home (comment)  walking, staff videos      Frequency Add 2 additional days to program exercise sessions. Add 2 additional days to program exercise sessions.      Initial Home Exercises Provided 10/19/21 10/19/21        Oxygen   Maintain Oxygen Saturation 88% or higher 88% or higher               Exercise Comments:   Exercise Comments     Row Name 09/21/21 0751 12/02/21 0921         Exercise Comments First full day of exercise!  Patient was oriented to gym and equipment including functions, settings, policies, and procedures.  Patient's individual exercise prescription and treatment plan were reviewed.  All starting workloads were established based on the  results of the 6 minute walk test done at initial orientation visit.  The plan for exercise progression was also introduced and progression will be customized based on patient's performance and goals. Roxy graduated today from  rehab with 36 sessions completed.  Details of the patient's exercise prescription and what He needs to do in order to continue the prescription and progress were discussed with patient.  Patient was given a copy of prescription and goals.  Patient verbalized understanding.  Vedant plans to continue to exercise by exercising at [employer]..               Exercise Goals and Review:   Exercise Goals     Row Name 09/19/21 1158             Exercise Goals   Increase Physical Activity Yes       Intervention Provide advice, education, support and counseling about physical activity/exercise needs.;Develop an individualized exercise prescription for aerobic and resistive training based on initial evaluation findings, risk stratification, comorbidities and participant's personal goals.       Expected Outcomes Long Term: Exercising regularly at least 3-5 days a week.;Short Term: Attend rehab on a regular basis to increase amount of physical activity.;Long Term: Add in home exercise to  make exercise part of routine and to increase amount of physical activity.       Increase Strength and Stamina Yes       Intervention Provide advice, education, support and counseling about physical activity/exercise needs.;Develop an individualized exercise prescription for aerobic and resistive training based on initial evaluation findings, risk stratification, comorbidities and participant's personal goals.       Expected Outcomes Short Term: Increase workloads from initial exercise prescription for resistance, speed, and METs.;Long Term: Improve cardiorespiratory fitness, muscular endurance and strength as measured by increased METs and functional capacity (6MWT);Short Term: Perform resistance training exercises routinely during rehab and add in resistance training at home       Able to understand and use rate of perceived exertion (RPE) scale Yes       Intervention Provide education and explanation on how to use RPE scale       Expected Outcomes Short Term: Able to use RPE daily in rehab to express subjective intensity level;Long Term:  Able to use RPE to guide intensity level when exercising independently       Able to understand and use Dyspnea scale Yes       Intervention Provide education and explanation on how to use Dyspnea scale       Expected Outcomes Short Term: Able to use Dyspnea scale daily in rehab to express subjective sense of shortness of breath during exertion;Long Term: Able to use Dyspnea scale to guide intensity level when exercising independently       Knowledge and understanding of Target Heart Rate Range (THRR) Yes       Intervention Provide education and explanation of THRR including how the numbers were predicted and where they are located for reference       Expected Outcomes Short Term: Able to state/look up THRR;Short Term: Able to use daily as guideline for intensity in rehab;Long Term: Able to use THRR to govern intensity when exercising independently       Able to  check pulse independently Yes       Intervention Provide education and demonstration on how to check pulse in carotid and radial arteries.;Review the importance of being able to check your own pulse for safety  during independent exercise       Expected Outcomes Short Term: Able to explain why pulse checking is important during independent exercise;Long Term: Able to check pulse independently and accurately       Understanding of Exercise Prescription Yes       Intervention Provide education, explanation, and written materials on patient's individual exercise prescription       Expected Outcomes Short Term: Able to explain program exercise prescription;Long Term: Able to explain home exercise prescription to exercise independently                Exercise Goals Re-Evaluation :  Exercise Goals Re-Evaluation     Row Name 09/21/21 0752 10/03/21 0906 10/17/21 1102 10/19/21 0807 10/31/21 0902     Exercise Goal Re-Evaluation   Exercise Goals Review Able to understand and use rate of perceived exertion (RPE) scale;Knowledge and understanding of Target Heart Rate Range (THRR);Understanding of Exercise Prescription;Increase Physical Activity;Increase Strength and Stamina;Able to understand and use Dyspnea scale;Able to check pulse independently Increase Physical Activity;Increase Strength and Stamina;Understanding of Exercise Prescription Increase Physical Activity;Increase Strength and Stamina;Understanding of Exercise Prescription Increase Physical Activity;Increase Strength and Stamina;Understanding of Exercise Prescription Increase Physical Activity;Increase Strength and Stamina;Understanding of Exercise Prescription   Comments Reviewed RPE and dyspnea scales, THR and program prescription with pt today.  Pt voiced understanding and was given a copy of goals to take home. Conlee is off to a good start in rehab.  He has completed his first four full days of exercise thus far.  He is up to 30 laps on the  track and level 6 on the NuStep. We will conitnue to montior his progress. Lydia continues to do well in rehab. He has worked up to 38 watts on the recumbent bike at level 3. He has exercised at 30 laps on the track and should be encouraged to increase beyond that. He continued to hit his THR. Will continue to monitor. Malakhi is doing well in rehab.  He is starting to feel like his stamina is starting to recover.  Reviewed home exercise with pt today.  Pt plans to walk and use staff videos at home for exercise.  Reviewed THR, pulse, RPE, sign and symptoms, pulse oximetery and when to call 911 or MD.  Also discussed weather considerations and indoor options.  Pt voiced understanding. Momin is doing well in rehab. He recently improved to level 7 on the T4. He also improved to 35 laps on the track as well. He has improved from 4 lb to 5 lb hand weights for resistance training as well. Latrelle also improved his overall average METs to 3.34 METs. We will continue to monitor his progress in the program.   Expected Outcomes Short: Use RPE daily to regulate intensity. Long: Follow program prescription in THR. Short: Conitnue to attend rehab reguarly Long: Continue to follow program prescription Short: Increase number of laps on track Long: Continue to increase overall MET level Short: Start to add in more walking at home  Long: Continue to improve stamina Short: Continue to push for more laps on the track. Long: Continue to improve strength and stamina.    [place] Name 11/11/21 0753 11/14/21 0852 11/30/21 1404         Exercise Goal Re-Evaluation   Exercise Goals Review Increase Physical Activity;Increase Strength and Stamina;Understanding of Exercise Prescription Increase Physical Activity;Increase Strength and Stamina;Understanding of Exercise Prescription Increase Physical Activity;Increase Strength and Stamina;Understanding of Exercise Prescription     Comments Chancellor is  doing well in rehab.  He is going to [employer] two to three times a week.  He is feeling stronger and getting his stamina back. Sharvil is doing well in rehab. He tolerated using the treadmill at a speed of 2 mph with no incline. He also improved to level 8 on the T4. Nathanie improved his overall average MET level as well to 3.38 METs. We will continue to monitor his progress in the program. Layth continues to do well in rehab. His overall METs increased again to about 3.6. He tried out the [employer] for the first time and was able to work at level 6. His laps have been consistent at 30 and we hope to see that improve overtime. Will continue to monitor.     Expected Outcomes Short: Conitnue to go to [employer] on off days Long: Conitnue to improve stamina Short: add incline on treadmill to increase workload. Long: Continue to improve strength and stamina. Short: Increase laps on track over 30 Long: Continue to increase overall MET level              Discharge Exercise Prescription (Final Exercise Prescription Changes):  Exercise Prescription Changes - 11/30/21 1400       Response to Exercise   Blood Pressure (Admit) 112/64    Blood Pressure (Exit) 122/60    Heart Rate (Admit) 62 bpm    Heart Rate (Exercise) 107 bpm    Heart Rate (Exit) 72 bpm    Rating of Perceived Exertion (Exercise) 15    Symptoms none    Duration Continue with 30 min of aerobic exercise without signs/symptoms of physical distress.    Intensity THRR unchanged      Progression   Progression Continue to progress workloads to maintain intensity without signs/symptoms of physical distress.    Average METs 3.63      Resistance Training   Training Prescription Yes    Weight 5 lb    Reps 10-15      Interval Training   Interval Training No      NuStep   Level 7    Minutes 15    METs 5      Biostep-RELP   Level 6    Minutes 15    METs 4      Track   Laps 30    Minutes 15    METs 2.63      Home Exercise Plan   Plans to continue exercise at  Home (comment)   walking, staff videos   Frequency Add 2 additional days to program exercise sessions.    Initial Home Exercises Provided 10/19/21      Oxygen   Maintain Oxygen Saturation 88% or higher             Nutrition:  Target Goals: Understanding of nutrition guidelines, daily intake of sodium '1500mg'$ , cholesterol '200mg'$ , calories 30% from fat and 7% or less from saturated fats, daily to have 5 or more servings of fruits and vegetables.  Education: All About Nutrition: -Group instruction provided by verbal, written material, interactive activities, discussions, models, and posters to present general guidelines for heart healthy nutrition including fat, fiber, MyPlate, the role of sodium in heart healthy nutrition, utilization of the nutrition label, and utilization of this knowledge for meal planning. Follow up email sent as well. Written material given at graduation. Flowsheet Row Cardiac Rehab from 11/30/2021 in [facility] Cardiac and Pulmonary Rehab  Education need identified 09/19/21  Date 10/19/21  Educator  [place]  Instruction Review Code 1- Verbalizes Understanding       Biometrics:  Pre Biometrics - 09/19/21 1158       Pre Biometrics   Height 5' 8.5" (1.74 m)    Weight 238 lb 3.2 oz (108 kg)    BMI (Calculated) 35.69    Single Leg Stand 0.9 seconds             Post Biometrics - 11/21/21 0817        Post  Biometrics   Height 5' 8.5" (1.74 m)    Weight 237 lb 8 oz (107.7 kg)    BMI (Calculated) 35.58    Single Leg Stand 7.9 seconds             Nutrition Therapy Plan and Nutrition Goals:  Nutrition Therapy & Goals - 11/07/21 0846       Nutrition Therapy   Diet Heart healthy, low Na    Drug/Food Interactions Statins/Certain Fruits    Protein (specify units) 80g    Fiber 30 grams    Whole Grain Foods 3 servings    Saturated Fats 16 max. grams    Fruits and Vegetables 8 servings/day    Sodium 2 grams      Personal Nutrition Goals   Nutrition Goal  ST: include 2 snacks during the day with fiber, protein, and fat LT: meet nutritional needs with variety and added snacks    Comments 61 y.o. M admitted to cardiac rehab s/p CABG x3. PMHx includes recurrent malignant melanoma , cancer in groin s/p radiation and resection, anemia, atherosclerosis, CAD, arthritis, GERD, HLD, HTN, OSA, CKD stg 3. Relevant medications includes lipitor, clonazepam, uloric, ferrous sulfate, hydrochlorothiazide, hydrocodone, lorazepam, MVI, omeprazole, zofran, Nivolumab. PYP Score: 64. Vegetables & Fruits 5/12. Breads, Grains & Cereals 5/12. Red & Processed Meat 9/12. Poultry 2/2. Fish & Shellfish 0/4. Beans, Nuts & Seeds 1/4. Milk & Dairy Foods 5/6. Toppings, Oils, Seasonings & Salt 18/20. Sweets, Snacks & Restaurant Food 13/14. Beverages 6/10. He feels that he is drinking too many sodas 1x/day, but he has cut back a lot; he would like to try crystal lite instead. Whatever him and his wife have in the house to eat; she will normally cook dinner. S: pack of nab crackers or candy bar D: meat, vegetables: frozen mixed vegetables, broccoli. He started eating just one snack during the day as he thought it would be healthier and he enjoys it. Discussed how eating consistently can help to meet nutritional needs and provide energy during the day; suggested that if he didn't want to include more meals, to inlcude more nutritionally dense snacks such as fruit with nuts/seeds, eggs, 1/2 sandwich, etc. He does not like walnuts or yogurt as well as some fruits and vegetables; discussed how variety of foods helps to meet nutritional needs and that he doesn't have to like all foods to stay healthy - suggested other options aside from fish and walnuts for omega-3 FAs such as chia seeds, ground flaxseed, and omega-3 fortified eggs. Reviewed heart healthy eating.      Intervention Plan   Intervention Prescribe, educate and counsel regarding individualized specific dietary modifications aiming towards  targeted core components such as weight, hypertension, lipid management, diabetes, heart failure and other comorbidities.    Expected Outcomes Short Term Goal: Understand basic principles of dietary content, such as calories, fat, sodium, cholesterol and nutrients.;Short Term Goal: A plan has been developed with personal nutrition goals set during dietitian appointment.;Long Term Goal: Adherence to  prescribed nutrition plan.             Nutrition Assessments:  MEDIFICTS Score Key: ?70 Need to make dietary changes  40-70 Heart Healthy Diet ? 40 Therapeutic Level Cholesterol Diet  Flowsheet Row Cardiac Rehab from 11/25/2021 in [facility] Cardiac and Pulmonary Rehab  Picture Your Plate Total Score on Admission 64  Picture Your Plate Total Score on Discharge 60      Picture Your Plate Scores: <10 Unhealthy dietary pattern with much room for improvement. 41-50 Dietary pattern unlikely to meet recommendations for good health and room for improvement. 51-60 More healthful dietary pattern, with some room for improvement.  >60 Healthy dietary pattern, although there may be some specific behaviors that could be improved.    Nutrition Goals Re-Evaluation:  Nutrition Goals Re-Evaluation     [place] Name 10/19/21 0827 11/11/21 0757           Goals   Nutrition Goal He has been waiting on scheduling MD appointments to schedule, will check back in to schedule for August. ST: include 2 snacks during the day with fiber, protein, and fat LT: meet nutritional needs with variety and added snacks      Comment Maximum will be starting cancer treatments on Friday 7/28 and last set of MD appt are first of August, once he knows his schedule he will make appt with dietitian Delvis is doing well with his diet.  He met with Melissa recently and has added in more salads to get in more fiber.  He is trying to get in more protein in his snacks.      Expected Outcome Meet with dietitian Short: Conitnue to add protein  Long: Conitnue to work on portion control               Nutrition Goals Discharge (Final Nutrition Goals Re-Evaluation):  Nutrition Goals Re-Evaluation - 11/11/21 0757       Goals   Nutrition Goal ST: include 2 snacks during the day with fiber, protein, and fat LT: meet nutritional needs with variety and added snacks    Comment Kutler is doing well with his diet.  He met with Melissa recently and has added in more salads to get in more fiber.  He is trying to get in more protein in his snacks.    Expected Outcome Short: Conitnue to add protein Long: Conitnue to work on portion control             Psychosocial: Target Goals: Acknowledge presence or absence of significant depression and/or stress, maximize coping skills, provide positive support system. Participant is able to verbalize types and ability to use techniques and skills needed for reducing stress and depression.   Education: Stress, Anxiety, and Depression - Group verbal and visual presentation to define topics covered.  Reviews how body is impacted by stress, anxiety, and depression.  Also discusses healthy ways to reduce stress and to treat/manage anxiety and depression.  Written material given at graduation.   Education: Sleep Hygiene -Provides group verbal and written instruction about how sleep can affect your health.  Define sleep hygiene, discuss sleep cycles and impact of sleep habits. Review good sleep hygiene tips.    Initial Review & Psychosocial Screening:  Initial Psych Review & Screening - 09/14/21 0941       Initial Review   Current issues with None Identified      Family Dynamics   Good Support System? Yes    Comments He can look to his  borther and sister for support. He feels ready to get his health back in order. Patients states depression or anxiety.      Barriers   Psychosocial barriers to participate in program The patient should benefit from training in stress management and  relaxation.;There are no identifiable barriers or psychosocial needs.      Screening Interventions   Interventions Encouraged to exercise;To provide support and resources with identified psychosocial needs;Provide feedback about the scores to participant    Expected Outcomes Short Term goal: Utilizing psychosocial counselor, staff and physician to assist with identification of specific Stressors or current issues interfering with healing process. Setting desired goal for each stressor or current issue identified.;Long Term Goal: Stressors or current issues are controlled or eliminated.;Short Term goal: Identification and review with participant of any Quality of Life or Depression concerns found by scoring the questionnaire.;Long Term goal: The participant improves quality of Life and PHQ9 Scores as seen by post scores and/or verbalization of changes             Quality of Life Scores:   Quality of Life - 11/25/21 0808       Quality of Life   Select Quality of Life      Quality of Life Scores   Health/Function Pre 23.43 %    Health/Function Post 25.3 %    Health/Function % Change 7.98 %    Socioeconomic Pre 27.33 %    Socioeconomic Post 28.07 %    Socioeconomic % Change  2.71 %    Psych/Spiritual Pre 28.29 %    Psych/Spiritual Post 30 %    Psych/Spiritual % Change 6.04 %    Family Pre 28.5 %    Family Post 25.5 %    Family % Change -10.53 %    GLOBAL Pre 25.86 %    GLOBAL Post 26.91 %    GLOBAL % Change 4.06 %            Scores of 19 and below usually indicate a poorer quality of life in these areas.  A difference of  2-3 points is a clinically meaningful difference.  A difference of 2-3 points in the total score of the Quality of Life Index has been associated with significant improvement in overall quality of life, self-image, physical symptoms, and general health in studies assessing change in quality of life.  PHQ-9: Review Flowsheet       11/25/2021 09/19/2021   Depression screen PHQ 2/9  Decreased Interest 0 0  Down, Depressed, Hopeless 0 0  PHQ - 2 Score 0 0  Altered sleeping 3 2  Tired, decreased energy 2 2  Change in appetite 0 0  Feeling bad or failure about yourself  0 0  Trouble concentrating 0 0  Moving slowly or fidgety/restless 0 0  Suicidal thoughts 0 0  PHQ-9 Score 5 4  Difficult doing work/chores Not difficult at all Not difficult at all   Interpretation of Total Score  Total Score Depression Severity:  1-4 = Minimal depression, 5-9 = Mild depression, 10-14 = Moderate depression, 15-19 = Moderately severe depression, 20-27 = Severe depression   Psychosocial Evaluation and Intervention:  Psychosocial Evaluation - 09/14/21 0945       Psychosocial Evaluation & Interventions   Interventions Encouraged to exercise with the program and follow exercise prescription;Relaxation education;Stress management education    Comments He can look to his borther and sister for support. He feels ready to get his health back in order. Patients states depression  or anxiety.    Expected Outcomes Short: Start [facility] to help with mood. Long: Maintain a healthy mental state    Continue Psychosocial Services  Follow up required by staff             Psychosocial Re-Evaluation:  Psychosocial Re-Evaluation     Row Name 10/19/21 0825 11/11/21 0754           Psychosocial Re-Evaluation   Current issues with Current Stress Concerns Current Stress Concerns;Current Sleep Concerns      Comments Trevel is doing well in rehab. He had a PET scan last week and they found another lesion on his liver but are not able to biopsy it.  He is scheduled to start chemo infusion treatments again on Friday and will get an infusion every two weeks.  He will also have blood work done on Friday.  Last round of treatments, he did not have side effects and hopes that will be the case again this go around. Otherwise, he is feeling good mentally and just takes it one  day at a time. Dejour had a test done yesterday for cancer.  They placed device on top of his incision and he is still sore today. He is hoping to have the results back over the weekend.  He is still getting treatments and has his next one next Friday.  He still has some good night and some hard nights with his sleep.  He is trying to stay positive and focus on the good.      Expected Outcomes Short: Get through treatments Long: Continue to stay positive Short: Hear back about biopsy Long: Continue to focus on positive      Interventions Stress management education;Encouraged to attend Cardiac Rehabilitation for the exercise Stress management education;Encouraged to attend Cardiac Rehabilitation for the exercise      Continue Psychosocial Services  Follow up required by staff --               Psychosocial Discharge (Final Psychosocial Re-Evaluation):  Psychosocial Re-Evaluation - 11/11/21 0754       Psychosocial Re-Evaluation   Current issues with Current Stress Concerns;Current Sleep Concerns    Comments Mareon had a test done yesterday for cancer.  They placed device on top of his incision and he is still sore today. He is hoping to have the results back over the weekend.  He is still getting treatments and has his next one next Friday.  He still has some good night and some hard nights with his sleep.  He is trying to stay positive and focus on the good.    Expected Outcomes Short: Hear back about biopsy Long: Continue to focus on positive    Interventions Stress management education;Encouraged to attend Cardiac Rehabilitation for the exercise             Vocational Rehabilitation: Provide vocational rehab assistance to qualifying candidates.   Vocational Rehab Evaluation & Intervention:   Education: Education Goals: Education classes will be provided on a variety of topics geared toward better understanding of heart health and risk factor modification. Participant will state  understanding/return demonstration of topics presented as noted by education test scores.  Learning Barriers/Preferences:  Learning Barriers/Preferences - 09/14/21 0940       Learning Barriers/Preferences   Learning Barriers None    Learning Preferences None             General Cardiac Education Topics:  AED/CPR: - Group verbal and written instruction with the  use of models to demonstrate the basic use of the AED with the basic ABC's of resuscitation.   Anatomy and Cardiac Procedures: - Group verbal and visual presentation and models provide information about basic cardiac anatomy and function. Reviews the testing methods done to diagnose heart disease and the outcomes of the test results. Describes the treatment choices: Medical Management, Angioplasty, or Coronary Bypass Surgery for treating various heart conditions including Myocardial Infarction, Angina, Valve Disease, and Cardiac Arrhythmias.  Written material given at graduation. Flowsheet Row Cardiac Rehab from 11/30/2021 in [facility] Cardiac and Pulmonary Rehab  Education need identified 09/19/21  Date 10/05/21  Educator SB  Instruction Review Code 1- Verbalizes Understanding       Medication Safety: - Group verbal and visual instruction to review commonly prescribed medications for heart and lung disease. Reviews the medication, class of the drug, and side effects. Includes the steps to properly store meds and maintain the prescription regimen.  Written material given at graduation.   Intimacy: - Group verbal instruction through game format to discuss how heart and lung disease can affect sexual intimacy. Written material given at graduation..   Know Your Numbers and Heart Failure: - Group verbal and visual instruction to discuss disease risk factors for cardiac and pulmonary disease and treatment options.  Reviews associated critical values for Overweight/Obesity, Hypertension, Cholesterol, and Diabetes.  Discusses basics  of heart failure: signs/symptoms and treatments.  Introduces Heart Failure Zone chart for action plan for heart failure.  Written material given at graduation. Flowsheet Row Cardiac Rehab from 11/30/2021 in [facility] Cardiac and Pulmonary Rehab  Education need identified 09/19/21       Infection Prevention: - Provides verbal and written material to individual with discussion of infection control including proper hand washing and proper equipment cleaning during exercise session. Flowsheet Row Cardiac Rehab from 11/30/2021 in [facility] Cardiac and Pulmonary Rehab  Date 09/14/21  Educator [facility]  Instruction Review Code 1- Verbalizes Understanding       Falls Prevention: - Provides verbal and written material to individual with discussion of falls prevention and safety. Flowsheet Row Cardiac Rehab from 11/30/2021 in [facility] Cardiac and Pulmonary Rehab  Date 09/14/21  Educator [facility]  Instruction Review Code 1- Verbalizes Understanding       Other: -Provides group and verbal instruction on various topics (see comments)   Knowledge Questionnaire Score:  Knowledge Questionnaire Score - 11/25/21 5053       Knowledge Questionnaire Score   Pre Score 21/26    Post Score 22/26             Core Components/Risk Factors/Patient Goals at Admission:  Personal Goals and Risk Factors at Admission - 09/19/21 1200       Core Components/Risk Factors/Patient Goals on Admission    Weight Management Yes;Weight Loss;Obesity    Intervention Weight Management: Develop a combined nutrition and exercise program designed to reach desired caloric intake, while maintaining appropriate intake of nutrient and fiber, sodium and fats, and appropriate energy expenditure required for the weight goal.;Weight Management: Provide education and appropriate resources to help participant work on and attain dietary goals.;Weight Management/Obesity: Establish reasonable short term and long term weight goals.;Obesity: Provide education  and appropriate resources to help participant work on and attain dietary goals.    Admit Weight 238 lb 3.2 oz (108 kg)    Goal Weight: Short Term 233 lb (105.7 kg)    Goal Weight: Long Term 220 lb (99.8 kg)    Expected Outcomes Short Term: Continue to  assess and modify interventions until short term weight is achieved;Long Term: Adherence to nutrition and physical activity/exercise program aimed toward attainment of established weight goal;Weight Loss: Understanding of general recommendations for a balanced deficit meal plan, which promotes 1-2 lb weight loss per week and includes a negative energy balance of [phone] kcal/d;Understanding recommendations for meals to include 15-35% energy as protein, 25-35% energy from fat, 35-60% energy from carbohydrates, less than $RemoveB'200mg'HCahhuJw$  of dietary cholesterol, 20-35 gm of total fiber daily;Understanding of distribution of calorie intake throughout the day with the consumption of 4-5 meals/snacks    Hypertension Yes    Intervention Provide education on lifestyle modifcations including regular physical activity/exercise, weight management, moderate sodium restriction and increased consumption of fresh fruit, vegetables, and low fat dairy, alcohol moderation, and smoking cessation.;Monitor prescription use compliance.    Expected Outcomes Long Term: Maintenance of blood pressure at goal levels.;Short Term: Continued assessment and intervention until BP is < 140/78mm HG in hypertensive participants. < 130/5mm HG in hypertensive participants with diabetes, heart failure or chronic kidney disease.    Lipids Yes    Intervention Provide education and support for participant on nutrition & aerobic/resistive exercise along with prescribed medications to achieve LDL '70mg'$ , HDL >$Remo'40mg'ufFaA$ .    Expected Outcomes Short Term: Participant states understanding of desired cholesterol values and is compliant with medications prescribed. Participant is following exercise prescription and  nutrition guidelines.;Long Term: Cholesterol controlled with medications as prescribed, with individualized exercise RX and with personalized nutrition plan. Value goals: LDL < $Rem'70mg'CccF$ , HDL > 40 mg.             Education:Diabetes - Individual verbal and written instruction to review signs/symptoms of diabetes, desired ranges of glucose level fasting, after meals and with exercise. Acknowledge that pre and post exercise glucose checks will be done for 3 sessions at entry of program.   Core Components/Risk Factors/Patient Goals Review:   Goals and Risk Factor Review     Row Name 10/19/21 3790 11/11/21 0759           Core Components/Risk Factors/Patient Goals Review   Personal Goals Review Weight Management/Obesity;Hypertension;Lipids Weight Management/Obesity;Hypertension;Lipids      Review Sem is doing well in rehab.  He had lost weight after his cancer treatments before but has started to gain it back now.  He will also be starting treatments again on Friday.  He still wants to lose some weight.  His pressures are doing well in class.  He does not check them at home but was encouraged to do so.  He said that he needs to find his cuff and maybe either put new batteries in it or get a new one.  He is doing well with his medications overall. Ruperto is doing wel in rehab.  His weight is trending down again.  His pressures are doing well in class.  He is still not checking at home.  He is doing well with his meds.  He is waiting to hear back from biopsy yesterday and getting treatments.      Expected Outcomes Short: Find BP cuff to use at home Long: Conitnue to monitor risk factors. Short: Continue to work on weight loss Long: conitnue to monitor risk factors.               Core Components/Risk Factors/Patient Goals at Discharge (Final Review):   Goals and Risk Factor Review - 11/11/21 0759       Core Components/Risk Factors/Patient Goals Review   Personal Goals Review Weight  Management/Obesity;Hypertension;Lipids    Review Johnwilliam is doing wel in rehab.  His weight is trending down again.  His pressures are doing well in class.  He is still not checking at home.  He is doing well with his meds.  He is waiting to hear back from biopsy yesterday and getting treatments.    Expected Outcomes Short: Continue to work on weight loss Long: conitnue to monitor risk factors.             ITP Comments:  ITP Comments     Row Name 09/14/21 0938 09/19/21 1143 09/21/21 0751 10/05/21 0740 11/02/21 0240   ITP Comments Virtual Visit completed. Patient informed on EP and RD appointment and 6 Minute walk test. Patient also informed of patient health questionnaires on My Chart. Patient Verbalizes understanding. Visit diagnosis can be found in [facility]/08/[facility]21. Completed 6MWT and gym orientation. Initial ITP created and sent for review to Dr. Emily Filbert, Medical Director. First full day of exercise!  Patient was oriented to gym and equipment including functions, settings, policies, and procedures.  Patient's individual exercise prescription and treatment plan were reviewed.  All starting workloads were established based on the results of the 6 minute walk test done at initial orientation visit.  The plan for exercise progression was also introduced and progression will be customized based on patient's performance and goals. 30 Day review completed. Medical Director ITP review done, changes made as directed, and signed approval by Medical Director.   New 30 Day review completed. Medical Director ITP review done, changes made as directed, and signed approval by Medical Director.    [employer] Name 11/07/21 0910 11/30/21 0729 12/02/21 0921       ITP Comments Completed initial RD consultation 30 Day review completed. Medical Director ITP review done, changes made as directed, and signed approval by Medical Director. Kiley graduated today from  rehab with 36 sessions completed.  Details of the patient's  exercise prescription and what He needs to do in order to continue the prescription and progress were discussed with patient.  Patient was given a copy of prescription and goals.  Patient verbalized understanding.  Leaf plans to continue to exercise by exercising at [employer]..              Comments: DISCHARGE ITP

## 2021-12-02 NOTE — Patient Instructions (Signed)
[facility] [place]  Discharge Instructions: Thank you for choosing [facility] to provide your oncology and hematology care.  If you have a lab appointment with the Cancer Center, please go directly to the Cancer Center and check in at the registration area.  Wear comfortable clothing and clothing appropriate for easy access to any Portacath or PICC line.   We strive to give you quality time with your provider. You may need to reschedule your appointment if you arrive late (15 or more minutes).  Arriving late affects you and other patients whose appointments are after yours.  Also, if you miss three or more appointments without notifying the office, you may be dismissed from the clinic at the provider's discretion.      For prescription refill requests, have your pharmacy contact our office and allow 72 hours for refills to be completed.    Today you received the following chemotherapy and/or immunotherapy agents Nivolumab       To help prevent nausea and vomiting after your treatment, we encourage you to take your nausea medication as directed.  BELOW ARE SYMPTOMS THAT SHOULD BE REPORTED IMMEDIATELY: *FEVER GREATER THAN 100.4 F (38 C) OR HIGHER *CHILLS OR SWEATING *NAUSEA AND VOMITING THAT IS NOT CONTROLLED WITH YOUR NAUSEA MEDICATION *UNUSUAL SHORTNESS OF BREATH *UNUSUAL BRUISING OR BLEEDING *URINARY PROBLEMS (pain or burning when urinating, or frequent urination) *BOWEL PROBLEMS (unusual diarrhea, constipation, pain near the anus) TENDERNESS IN MOUTH AND THROAT WITH OR WITHOUT PRESENCE OF ULCERS (sore throat, sores in mouth, or a toothache) UNUSUAL RASH, SWELLING OR PAIN  UNUSUAL VAGINAL DISCHARGE OR ITCHING   Items with * indicate a potential emergency and should be followed up as soon as possible or go to the Emergency Department if any problems should occur.  Please show the CHEMOTHERAPY ALERT CARD or IMMUNOTHERAPY ALERT CARD at check-in to  the Emergency Department and triage nurse.  Should you have questions after your visit or need to cancel or reschedule your appointment, please contact [facility]  [phone] and follow the prompts.  Office hours are 8:00 a.m. to 4:30 p.m. Monday - Friday. Please note that voicemails left after 4:00 p.m. may not be returned until the following business day.  We are closed weekends and major holidays. You have access to a nurse at all times for urgent questions. Please call the main number to the clinic [phone] and follow the prompts.  For any non-urgent questions, you may also contact your provider using MyChart. We now offer e-Visits for anyone 18 and older to request care online for non-urgent symptoms. For details visit [email or web address].   Also download the MyChart app! Go to the app store, search "MyChart", open the app, select [facility], and log in with your MyChart username and password.  Masks are optional in the cancer centers. If you would like for your care team to wear a mask while they are taking care of you, please let them know. For doctor visits, patients may have with them one support person who is at least 62 years old. At this time, visitors are not allowed in the infusion area.   

## 2021-12-02 NOTE — Progress Notes (Signed)
Pt here for follow up. No new concerns voiced.   

## 2021-12-02 NOTE — Progress Notes (Signed)
Daily Session Note  Patient Details  Name: Edward Pitts MRN: [id number] Date of Birth: 02-Apr-1960 Referring Provider:   Flowsheet Row Cardiac Rehab from 09/19/2021 in [facility] Card[facility]  Referring Provider Serafina Royals MD       Encounter Date: 12/02/2021  Check In:  Session Check In - 12/02/21 0919       Check-In   Supervising physician immediately available to respond to emergencies See telemetry face sheet for immediately available ER MD    Location [facility]Card[facility] Pul[facility]    Staff Present Heath Lark, RN, BSN, CCRP;Joseph [place], RCP,RRT,BSRT;Jessica [place], [place], [place], CCRP, CCET    Virtual Visit No    Medication changes reported     No    Fall or balance concerns reported    No    Warm-up and Cool-down Performed on first and last piece of equipment    Resistance Training Performed Yes    VAD Patient? No    PAD/SET Patient? No      Pain Assessment   Currently in Pain? No/denies                Social History   Tobacco Use  Smoking Status Never  Smokeless Tobacco Never    Goals Met:  Independence with exercise equipment Exercise tolerated well No report of concerns or symptoms today  Goals Unmet:  Not Applicable  Comments:  Edward Pitts graduated today from  rehab with 36 sessions completed.  Details of the patient's exercise prescription and what He needs to do in order to continue the prescription and progress were discussed with patient.  Patient was given a copy of prescription and goals.  Patient verbalized understanding.  Edward Pitts plans to continue to exercise by exercising at [employer]..     Dr. Emily Filbert is Medical Director for Edward Pitts.  Dr. Ottie Glazier is Medical Director for [facility] Rehabilitation.

## 2021-12-02 NOTE — Assessment & Plan Note (Signed)
Immunotherapy plan as listed above 

## 2021-12-08 ENCOUNTER — Telehealth: Payer: Self-pay

## 2021-12-08 DIAGNOSIS — K769 Liver disease, unspecified: Secondary | ICD-10-CM

## 2021-12-08 DIAGNOSIS — C438 Malignant melanoma of overlapping sites of skin: Secondary | ICD-10-CM

## 2021-12-08 NOTE — Telephone Encounter (Signed)
Pt informed and verbalized understanding. Request faxed to IR scheduling.

## 2021-12-08 NOTE — Telephone Encounter (Signed)
-----   Message from [name], MD sent at 12/08/2021  1:22 PM EDT ----- I discussed his case on tumor board. Radiologist recommend trials of US guided liver lesion biopsy.  Please arrange. I have discussed this possibility with him. During his last visit.  Please add a note, -discussed on TB with Dr.Henn

## 2021-12-09 ENCOUNTER — Encounter: Payer: Self-pay | Admitting: Internal Medicine

## 2021-12-09 ENCOUNTER — Other Ambulatory Visit: Payer: Self-pay | Admitting: Internal Medicine

## 2021-12-09 ENCOUNTER — Inpatient Hospital Stay (HOSPITAL_BASED_OUTPATIENT_CLINIC_OR_DEPARTMENT_OTHER): Payer: Medicare Other | Admitting: Internal Medicine

## 2021-12-09 VITALS — BP 109/40 | HR 63 | Temp 97.8°F | Resp 18 | Ht 68.5 in | Wt 242.0 lb

## 2021-12-09 DIAGNOSIS — Z923 Personal history of irradiation: Secondary | ICD-10-CM | POA: Diagnosis not present

## 2021-12-09 DIAGNOSIS — Z7982 Long term (current) use of aspirin: Secondary | ICD-10-CM | POA: Diagnosis not present

## 2021-12-09 DIAGNOSIS — R932 Abnormal findings on diagnostic imaging of liver and biliary tract: Secondary | ICD-10-CM | POA: Diagnosis not present

## 2021-12-09 DIAGNOSIS — K5730 Diverticulosis of large intestine without perforation or abscess without bleeding: Secondary | ICD-10-CM | POA: Diagnosis not present

## 2021-12-09 DIAGNOSIS — K439 Ventral hernia without obstruction or gangrene: Secondary | ICD-10-CM | POA: Diagnosis not present

## 2021-12-09 DIAGNOSIS — C4359 Malignant melanoma of other part of trunk: Secondary | ICD-10-CM | POA: Diagnosis not present

## 2021-12-09 DIAGNOSIS — E611 Iron deficiency: Secondary | ICD-10-CM | POA: Diagnosis not present

## 2021-12-09 DIAGNOSIS — N1831 Chronic kidney disease, stage 3a: Secondary | ICD-10-CM | POA: Diagnosis not present

## 2021-12-09 DIAGNOSIS — I1310 Hypertensive heart and chronic kidney disease without heart failure, with stage 1 through stage 4 chronic kidney disease, or unspecified chronic kidney disease: Secondary | ICD-10-CM | POA: Diagnosis not present

## 2021-12-09 DIAGNOSIS — R160 Hepatomegaly, not elsewhere classified: Secondary | ICD-10-CM | POA: Diagnosis not present

## 2021-12-09 DIAGNOSIS — D631 Anemia in chronic kidney disease: Secondary | ICD-10-CM | POA: Diagnosis not present

## 2021-12-09 DIAGNOSIS — E785 Hyperlipidemia, unspecified: Secondary | ICD-10-CM | POA: Diagnosis not present

## 2021-12-09 DIAGNOSIS — I252 Old myocardial infarction: Secondary | ICD-10-CM | POA: Diagnosis not present

## 2021-12-09 DIAGNOSIS — K769 Liver disease, unspecified: Secondary | ICD-10-CM | POA: Diagnosis not present

## 2021-12-09 DIAGNOSIS — I2510 Atherosclerotic heart disease of native coronary artery without angina pectoris: Secondary | ICD-10-CM | POA: Diagnosis not present

## 2021-12-09 DIAGNOSIS — Z79899 Other long term (current) drug therapy: Secondary | ICD-10-CM | POA: Diagnosis not present

## 2021-12-09 DIAGNOSIS — R569 Unspecified convulsions: Secondary | ICD-10-CM

## 2021-12-09 DIAGNOSIS — Z5112 Encounter for antineoplastic immunotherapy: Secondary | ICD-10-CM | POA: Diagnosis not present

## 2021-12-09 DIAGNOSIS — I700 Atherosclerosis of aorta: Secondary | ICD-10-CM | POA: Diagnosis not present

## 2021-12-09 DIAGNOSIS — C774 Secondary and unspecified malignant neoplasm of inguinal and lower limb lymph nodes: Secondary | ICD-10-CM | POA: Diagnosis not present

## 2021-12-09 DIAGNOSIS — K760 Fatty (change of) liver, not elsewhere classified: Secondary | ICD-10-CM | POA: Diagnosis not present

## 2021-12-09 NOTE — Progress Notes (Signed)
[facility] at [place], [place] [address] [phone]   Interval Evaluation  Date of Service: 12/09/21 Patient Name: Edward Pitts Patient MRN: [id number] Patient DOB: [birth year] Provider: Ventura Sellers, MD  Identifying Statement:  Edward Pitts is a 61 y.o. male with suspected seizures  Primary Cancer:  Oncologic History: Oncology History  Malignant melanoma of overlapping sites [facility])  04/16/2019 Cancer Staging   Staging form: Melanoma of the Skin, AJCC 8th Edition - Pathologic: Stage Unknown (rpTX, pN1b, cM0) - Signed by [name], MD on 07/27/2020 Stage prefix: Recurrence    04/23/2019 Initial Diagnosis   Malignant melanoma   -He has a history of left lower extremity melanoma in 2011, status post local excision -04/16/2019 patient underwent left groin mass resection  Resection pathology showed malignant melanoma, replacing a lymph node, with extracapsular extension, peripheral and deep margins involved.  Left inguinal contents, all 7 lymph nodes were negative for melanoma in the lymph nodes. Extranodal melanoma identified in lymphatic and interstitium between nodes -PDL1 80% TPS    07/07/2019 -  Radiation Therapy   status post adjuvant radiation.   07/23/2019 - 07/21/2021 Chemotherapy   Nivolumab q14d      06/29/2020 Imaging   CT chest abdomen pelvis showed stable postoperative appearance of the left groin.  No evidence of local recurrence.  No evidence of metastatic disease in the chest abdomen or pelvis.  Hepatic steatosis.  Stable subcentimeter fluid attenuation lesion of the lateral right lobe of the liver, likely benign cyst or hemangioma.  Coronary artery disease.  Aortic atherosclerosis   10/20/2020 Imaging   CT chest abdomen pelvis showed stable postoperative/radiation appearance of the left groin.  No evidence of local recurrence/metastatic disease within the chest abdomen/pelvis.  Fatty liver disease.  Diverticulosis  without evidence of typhlitis.  Aortic atherosclerosis   03/10/2021 Imaging   MRI brain without contrast showed no definitive evidence of intracranial metastatic disease.  Study is limited by absence of intravenous contrast.     04/26/2021 Imaging   CT chest abdomen pelvis without contrast showed stable post operative changes of left groin with no evidence of recurrent disease.  No evidence of metastatic disease in the chest abdomen pelvis.  Aortic atherosclerosis   08/08/2021 - 08/16/2021 Hospital Admission    patient was hospitalized due to NSTEMI status post CABG x3.  He also had pacemaker The echocardiogram showed left ventricular ejection fraction of 65 to 70%,   09/15/2021 Imaging   CT chest abdomen pelvis w contrast  IMPRESSION: 1. Subtle hypodense 9 mm lesion in the left lobe of the liver is new from prior imaging including previous contrasted CT dating back to December 10, 2019, with the lesion appearing to equilibrate with background liver on delayed imaging sequence but is incompletely evaluated on this imaging study and technically nonspecific possibly reflecting a benign perfusional variant and while its appearance is not typical for that of a melanoma metastasis, it is not excluded on this examination. Suggest more definitive characterization by hepatic protocol MRI with and without contrast. 2. Stable postoperative changes in the left groin without evidence of local recurrent disease.3. No evidence of metastatic disease in the chest or pelvis.4.  Aortic Atherosclerosis (ICD10-I70.0).      09/23/2021 Imaging   Contrast-enhanced liver ultrasound Mildly hypoenhancing 2.2 cm mass in the posterior aspect of the left lobe of the liver with washout characteristics concerning for non hepatocellular malignancy, concerning for melanotic metastasis given history. The lesion  is in an unfavorable location for percutaneous biopsy. Consider PET-CT for further characterization   10/21/2021 -   Chemotherapy   Nivolumab q14d      11/10/2021 Imaging   MRI Brain w wo contrast Negative for metastatic disease to the brain   11/10/2021 Imaging   MRI abdomen w wo contrast 1. Lesion of the posterior superior left lobe of the liver, hepatic segment II, measuring 1.9 x 1.8 cm corresponding to findings of prior imaging. Evaluation is somewhat limited breath motion artifact however there is subtle associated rim enhancement of this lesion. Findings are most in keeping with a hepatic metastasis in the setting of known recurrent melanoma. 2. Mild hepatic steatosis. 3. Cardiomegaly.      Interval History: Edward Pitts presents for clinical follow up.  He has experienced a decrease in frequency of seizure spells since the CABG procedure in May.  He did have one episode which abated with Ativan rescue several weeks ago.  Continues to undergo opdivo infusions for melanoma, planning liver biopsy for suspected metastasis.   H+P (04/15/21) Patient presents for follow up given neurologic complaints.  He has seen neurologist in [place] for several years, relevant history is obtained from review of prior records.  In short, he experiences paroxysmal episodes of "head in a vice, disconnected, in a daze, can't understand language".  Episodes started in 2019, last 1-2 minutes, and occur 2-3x per month.  He thinks frequency decreased when Clonazepam was started by Dr. Jannifer Franklin in 2019.  He had normal EEG at that time.  Recently was given a trial of Keppra, but he experienced significant dizziness and stopped it.  Had MRI brain without contrast done recently through Dr. Tasia Catchings.  Continues on nivolumab infusions for melanoma.   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  Medications: Current Outpatient Medications on File Prior to Visit  Medication Sig Dispense Refill   aspirin 81 MG EC tablet Take 81 mg by mouth  daily.     atorvastatin (LIPITOR) 20 MG tablet Take 20 mg by mouth daily.     Carboxymeth-Glyc-Polysorb PF (REFRESH OPTIVE MEGA-3) 0.5-1-0.5 % SOLN Place 1 drop into both eyes daily as needed (dry eyes).     clonazePAM (KLONOPIN) 0.5 MG tablet TAKE 0.5MG IN AM, 1MG IN PM 90 tablet 0   febuxostat (ULORIC) 40 MG tablet Take 40 mg by mouth daily.     ferrous sulfate 325 (65 FE) MG EC tablet Take 1 tablet (325 mg total) by mouth 2 (two) times daily with a meal. 60 tablet 3   hydrochlorothiazide (HYDRODIURIL) 25 MG tablet      hydrocortisone 2.5 % ointment Apply 1 application. topically daily as needed (itching).     Lacosamide (VIMPAT) 100 MG TABS TAKE 1 TABLET (100 MG TOTAL) BY MOUTH IN THE MORNING AND AT BEDTIME. 60 tablet 3   lidocaine-prilocaine (EMLA) cream Apply 1 application. topically as needed. Apply small amount of cream to port site approx 1-2 hours prior to appointment. 30 g 11   lisinopril (ZESTRIL) 20 MG tablet Take 20 mg by mouth daily.     LORazepam (ATIVAN) 2 MG tablet TAKE 1 TABLET (2 MG TOTAL) BY MOUTH EVERY 8 (EIGHT) HOURS AS NEEDED FOR SEIZURE. 20 tablet 0   metoprolol succinate (TOPROL-XL) 25 MG 24 hr tablet Take 25 mg by mouth daily.     Multiple Vitamin (MULTIVITAMIN WITH MINERALS) TABS tablet Take 1 tablet by mouth  daily. Centrum Silver     omeprazole (PRILOSEC) 20 MG capsule TAKE 1 CAPSULE BY MOUTH EVERY DAY 90 capsule 0   ondansetron (ZOFRAN) 4 MG tablet TAKE 1 TABLET BY MOUTH EVERY 8 HOURS AS NEEDED FOR NAUSEA AND VOMITING 90 tablet 1   Current Facility-Administered Medications on File Prior to Visit  Medication Dose Route Frequency Provider Last Rate Last Admin   heparin lock flush 100 UNIT/ML injection             Allergies:  Allergies  Allergen Reactions   Ibuprofen Other (See Comments)    Affects kidneys   Levetiracetam     Other reaction(s): Dizziness, Headache   Nsaids     Affects kidneys   Past Medical History:  Past Medical History:  Diagnosis Date    Anemia    iron treatments   Anxiety    Aortic atherosclerosis (HCC)    Arthritis    Cancer of groin ([place]) 2021   left groin, resected, radiation   Cataract    Complication of anesthesia    PONV   Coronary artery disease    Dizziness of unknown etiology    has led to seizures and passing out.   Family history of adverse reaction to anesthesia    PONV mother   GERD (gastroesophageal reflux disease)    History of complete heart block    PPM placed   Hyperlipidemia    Hypertension    LBBB (left bundle branch block)    Lymphedema of left leg    uses thigh high compression stockings   Melanoma ([place]) 2012   skin cancer, left thigh   OSA on CPAP    PONV (postoperative nausea and vomiting) 04/16/2019   Port-A-Cath in place    RIGHT chest wall   Presence of cardiac pacemaker    [employer]   Seizures ([place])    still has episodes of dizziness. last event 1 month ago (march 2022) and will pass out. takes clonazepam   Past Surgical History:  Past Surgical History:  Procedure Laterality Date   CORONARY ARTERY BYPASS GRAFT N/A 08/10/2021   Procedure: CORONARY ARTERY BYPASS GRAFTING (CABG) X 3 USING LEFT INTERNAL MAMMARY ARTERY AND RIGHT GREATER SAPHENOUS VEIN;  Surgeon: Dahlia Byes, MD;  Location: [facility];  Service: Open Heart Surgery;  Laterality: N/A;   CT RADIATION THERAPY GUIDE     left groin   dental implant     permanent implant   ENDOVEIN HARVEST OF GREATER SAPHENOUS VEIN Right 08/10/2021   Procedure: ENDOVEIN HARVEST OF GREATER SAPHENOUS VEIN;  Surgeon: Dahlia Byes, MD;  Location: [facility];  Service: Open Heart Surgery;  Laterality: Right;   KNEE SURGERY Left    arthroscopy   LEFT HEART CATH AND CORONARY ANGIOGRAPHY Left 06/29/2017   Procedure: LEFT HEART CATH AND CORONARY ANGIOGRAPHY;  Surgeon: [name], MD;  Location: [facility];  Service: Cardiovascular;  Laterality: Left;   LEFT HEART CATH AND CORONARY ANGIOGRAPHY N/A 08/08/2021   Procedure: LEFT HEART  CATH AND CORONARY ANGIOGRAPHY;  Surgeon: [name], MD;  Location: [facility];  Service: Cardiovascular;  Laterality: N/A;   LYMPH NODE DISSECTION Left 04/16/2019   Procedure: Left inguinal Lymph Node Dissection;  Surgeon: [name], MD;  Location: [facility];  Service: General;  Laterality: Left;   MELANOMA EXCISION Left 04/16/2019   Procedure: MELANOMA EXCISION LEFT GROIN MASS;  Surgeon: [name], MD;  Location: [facility];  Service: General;  Laterality: Left;  MELANOMA EXCISION WITH SENTINEL LYMPH NODE BIOPSY Left 2012   Left calf    PACEMAKER INSERTION N/A 08/26/2018   Procedure: INSERTION PACEMAKER;  Surgeon: [name], MD;  Location: [facility];  Service: Cardiovascular;  Laterality: N/A;   PORTA CATH INSERTION N/A 08/26/2019   Procedure: PORTA CATH INSERTION;  Surgeon: Katha Cabal, MD;  Location: [facility];  Service: Cardiovascular;  Laterality: N/A;   SUPERFICIAL LYMPH NODE BIOPSY / EXCISION Left 2020   lymph nodes removed around left groin melanoma site   TEE WITHOUT CARDIOVERSION N/A 08/10/2021   Procedure: TRANSESOPHAGEAL ECHOCARDIOGRAM (TEE);  Surgeon: Dahlia Byes, MD;  Location: [facility];  Service: Open Heart Surgery;  Laterality: N/A;   TEMPORARY PACEMAKER N/A 08/25/2018   Procedure: TEMPORARY PACEMAKER;  Surgeon: [name], MD;  Location: [facility];  Service: Cardiovascular;  Laterality: N/A;   TOTAL HIP ARTHROPLASTY Left 07/14/2020   Procedure: TOTAL HIP ARTHROPLASTY;  Surgeon: Dereck Leep, MD;  Location: [facility];  Service: Orthopedics;  Laterality: Left;   Social History:  Social History   Socioeconomic History   Marital status: Married    Spouse name: Vicente Males    Number of children: 7   Years of education: 12   Highest education level: Not on file  Occupational History    Comment: disability  Tobacco Use   Smoking status: Never   Smokeless tobacco: Never  Vaping Use   Vaping Use: Never used  Substance and  Sexual Activity   Alcohol use: No   Drug use: No   Sexual activity: Not Currently  Other Topics Concern   Not on file  Social History Narrative   Lives with  Wife,   Has 2 small dogs   Caffeine use: sodas (2 per day)      Out of work on disability.  Has a walk in shower. No stairs to climb   Oncology treatment ongoing. Uses port a cath for treatment.      pacemaker   Social Determinants of Health   Financial Resource Strain: Low Risk  (02/12/2019)   Overall Financial Resource Strain (CARDIA)    Difficulty of Paying Living Expenses: Not hard at all  Food Insecurity: No Food Insecurity (02/12/2019)   Hunger Vital Sign    Worried About Running Out of Food in the Last Year: Never true    Ran Out of Food in the Last Year: Never true  Transportation Needs: Unmet Transportation Needs (02/12/2019)   PRAPARE - Hydrologist (Medical): Yes    Lack of Transportation (Non-Medical): Yes  Physical Activity: Unknown (02/12/2019)   Exercise Vital Sign    Days of Exercise per Week: 0 days    Minutes of Exercise per Session: Not on file  Stress: No Stress Concern Present (02/12/2019)   [place]    Feeling of Stress : Only a little  Social Connections: Unknown (02/12/2019)   Social Connection and Isolation Panel [NHANES]    Frequency of Communication with Friends and Family: More than three times a week    Frequency of Social Gatherings with Friends and Family: Not on file    Attends Religious Services: Not on file    Active Member of Clubs or Organizations: Not on file    Attends Archivist Meetings: Not on file    Marital Status: Married  Intimate Partner Violence: Not At Risk (02/12/2019)   Humiliation, Afraid, Rape, and Kick  questionnaire    Fear of Current or Ex-Partner: No    Emotionally Abused: No    Physically Abused: No    Sexually Abused: No   Family History:  Family  History  Problem Relation Age of Onset   Cancer Paternal Grandmother     Review of Systems: Constitutional: Doesn't report fevers, chills or abnormal weight loss Eyes: Doesn't report blurriness of vision Ears, nose, mouth, throat, and face: Doesn't report sore throat Respiratory: Doesn't report cough, dyspnea or wheezes Cardiovascular: Doesn't report palpitation, chest discomfort  Gastrointestinal:  Doesn't report nausea, constipation, diarrhea GU: Doesn't report incontinence Skin: Doesn't report skin rashes Neurological: Per HPI Musculoskeletal: Doesn't report joint pain Behavioral/Psych: Doesn't report anxiety  Physical Exam: Wt Readings from Last 3 Encounters:  12/09/21 242 lb (109.8 kg)  12/02/21 241 lb 6.4 oz (109.5 kg)  11/21/21 237 lb 8 oz (107.7 kg)   Temp Readings from Last 3 Encounters:  12/02/21 (!) 96 F (35.6 C)  11/18/21 98.7 F (37.1 C)  11/04/21 98.6 F (37 C)   BP Readings from Last 3 Encounters:  12/02/21 (!) 139/99  11/18/21 (!) 132/47  11/14/21 129/70   Pulse Readings from Last 3 Encounters:  12/02/21 67  11/18/21 78  11/14/21 84   KPS: 90. General: Alert, cooperative, pleasant, in no acute distress Head: Normal EENT: No conjunctival injection or scleral icterus.  Lungs: Resp effort normal Cardiac: Regular rate Abdomen: Non-distended abdomen Skin: No rashes cyanosis or petechiae. Extremities: No clubbing or edema  Neurologic Exam: Mental Status: Awake, alert, attentive to examiner. Oriented to self and environment. Language is fluent with intact comprehension.  Cranial Nerves: Visual acuity is grossly normal. Visual fields are full. Extra-ocular movements intact. No ptosis. Face is symmetric Motor: Tone and bulk are normal. Power is full in both arms and legs. Reflexes are symmetric, no pathologic reflexes present.  Sensory: Intact to light touch Gait: Normal.   Labs: I have reviewed the data as listed    Component Value Date/Time    NA 139 12/02/2021 0916   K 4.5 12/02/2021 0916   CL 113 (H) 12/02/2021 0916   CO2 21 (L) 12/02/2021 0916   GLUCOSE 137 (H) 12/02/2021 0916   BUN 40 (H) 12/02/2021 0916   CREATININE 1.72 (H) 12/02/2021 0916   CALCIUM 8.9 12/02/2021 0916   PROT 6.8 12/02/2021 0916   ALBUMIN 4.1 12/02/2021 0916   AST 28 12/02/2021 0916   ALT 35 12/02/2021 0916   ALKPHOS 76 12/02/2021 0916   BILITOT 0.4 12/02/2021 0916   GFRNONAA 44 (L) 12/02/2021 0916   GFRAA >60 12/25/2019 0905   Lab Results  Component Value Date   WBC 5.1 12/02/2021   NEUTROABS 3.2 12/02/2021   HGB 11.2 (L) 12/02/2021   HCT 33.8 (L) 12/02/2021   MCV 90.4 12/02/2021   PLT 189 12/02/2021    Imaging: CLINICAL DATA:  Malignant melanoma of overlapping sites. Skin cancer, monitor; Melanoma.   EXAM: MRI HEAD WITHOUT CONTRAST   TECHNIQUE: Multiplanar, multiecho pulse sequences of the brain and surrounding structures were obtained without intravenous contrast.   COMPARISON:  MRI of the brain March 10, 2019.   FINDINGS: Brain: No acute infarction, hemorrhage, hydrocephalus, extra-axial collection or mass lesion. Minimal amount of punctate foci T2 hyperintensity within the white matter of the cerebral hemispheres nonspecific, may represent early chronic microangiopathy.   Vascular: Normal flow voids.   Skull and upper cervical spine: Normal marrow signal.   Sinuses/Orbits: Mucosal thickening of the right maxillary sinus.  The orbits are.   Other: Right mastoid effusion.   IMPRESSION: No definitive evidence of intracranial metastatic disease. However, the study is limited by absence of intravenous contrast. Small lesions could be missed.     Electronically Signed   By: Pedro Earls M.D.   On: 03/10/2021 12:08  Assessment/Plan Seizures ([place])  [address] is clinically stable today.  Seizure frequency has actually improved following NSTEMI, CABG; he still had one stereotypical event over the  summer, however.  Recommended continuing Vimpat 153m BID, Clonazepam 0.25/0.5 for seizure prevention.  Ok with PRN rescue ativan 233mas discussed.  We appreciate the opportunity to participate in the care of Oluwafemi Olivera.  He will follow up in clinic in 6 months or sooner if needed.  All questions were answered. The patient knows to call the clinic with any problems, questions or concerns. No barriers to learning were detected.  The total time spent in the encounter was 30 minutes and more than 50% was on counseling and review of test results   ZaVentura SellersMD Medical Director of Neuro-Oncology [facility]9/15/23 10:21 AM

## 2021-12-12 ENCOUNTER — Encounter: Payer: Self-pay | Admitting: Oncology

## 2021-12-12 ENCOUNTER — Telehealth: Payer: Self-pay | Admitting: *Deleted

## 2021-12-12 NOTE — Telephone Encounter (Signed)
Edward Pitts is scheduled for US liver bx Mon 9/25 '@10a'$   Arrive '@9a'$ . Pt informed of appt.

## 2021-12-12 NOTE — Telephone Encounter (Signed)
Patient called asking if his biopsy has been set up yet and to please cal with any details [phone]

## 2021-12-12 NOTE — Telephone Encounter (Signed)
This has already been addressed. Please see other ph note.

## 2021-12-13 NOTE — Progress Notes (Signed)
Patient on schedule for liver biopsy 9/25, called and spoke with patient on phone with pre procedure instructions given. Made aware to be here at 0900, NPO after mn prior to procedure as well as driver post procedure/recovery/discharge. Stated understanding. Permission/clearance given via Dr Nehemiah Massed to hold ASA after today for liver biopsy, LD today, patient stated understanding.

## 2021-12-13 NOTE — Telephone Encounter (Signed)
Anticoagulation clearance form to hold asprin '81mg'$ , scanned in media

## 2021-12-14 ENCOUNTER — Other Ambulatory Visit: Payer: Self-pay | Admitting: Oncology

## 2021-12-16 ENCOUNTER — Inpatient Hospital Stay (HOSPITAL_BASED_OUTPATIENT_CLINIC_OR_DEPARTMENT_OTHER): Payer: Medicare Other | Admitting: Oncology

## 2021-12-16 ENCOUNTER — Inpatient Hospital Stay: Payer: Medicare Other

## 2021-12-16 ENCOUNTER — Encounter: Payer: Self-pay | Admitting: Oncology

## 2021-12-16 ENCOUNTER — Other Ambulatory Visit (HOSPITAL_COMMUNITY): Payer: Self-pay | Admitting: Radiology

## 2021-12-16 DIAGNOSIS — Z923 Personal history of irradiation: Secondary | ICD-10-CM | POA: Diagnosis not present

## 2021-12-16 DIAGNOSIS — I252 Old myocardial infarction: Secondary | ICD-10-CM | POA: Diagnosis not present

## 2021-12-16 DIAGNOSIS — R160 Hepatomegaly, not elsewhere classified: Secondary | ICD-10-CM | POA: Diagnosis not present

## 2021-12-16 DIAGNOSIS — C438 Malignant melanoma of overlapping sites of skin: Secondary | ICD-10-CM | POA: Diagnosis not present

## 2021-12-16 DIAGNOSIS — K439 Ventral hernia without obstruction or gangrene: Secondary | ICD-10-CM | POA: Diagnosis not present

## 2021-12-16 DIAGNOSIS — N1832 Chronic kidney disease, stage 3b: Secondary | ICD-10-CM | POA: Diagnosis not present

## 2021-12-16 DIAGNOSIS — Z79899 Other long term (current) drug therapy: Secondary | ICD-10-CM | POA: Diagnosis not present

## 2021-12-16 DIAGNOSIS — E611 Iron deficiency: Secondary | ICD-10-CM | POA: Diagnosis not present

## 2021-12-16 DIAGNOSIS — C439 Malignant melanoma of skin, unspecified: Secondary | ICD-10-CM

## 2021-12-16 DIAGNOSIS — I1310 Hypertensive heart and chronic kidney disease without heart failure, with stage 1 through stage 4 chronic kidney disease, or unspecified chronic kidney disease: Secondary | ICD-10-CM | POA: Diagnosis not present

## 2021-12-16 DIAGNOSIS — D631 Anemia in chronic kidney disease: Secondary | ICD-10-CM

## 2021-12-16 DIAGNOSIS — R569 Unspecified convulsions: Secondary | ICD-10-CM | POA: Diagnosis not present

## 2021-12-16 DIAGNOSIS — K769 Liver disease, unspecified: Secondary | ICD-10-CM | POA: Diagnosis not present

## 2021-12-16 DIAGNOSIS — K760 Fatty (change of) liver, not elsewhere classified: Secondary | ICD-10-CM | POA: Diagnosis not present

## 2021-12-16 DIAGNOSIS — C4359 Malignant melanoma of other part of trunk: Secondary | ICD-10-CM | POA: Diagnosis not present

## 2021-12-16 DIAGNOSIS — E785 Hyperlipidemia, unspecified: Secondary | ICD-10-CM | POA: Diagnosis not present

## 2021-12-16 DIAGNOSIS — K5730 Diverticulosis of large intestine without perforation or abscess without bleeding: Secondary | ICD-10-CM | POA: Diagnosis not present

## 2021-12-16 DIAGNOSIS — N1831 Chronic kidney disease, stage 3a: Secondary | ICD-10-CM | POA: Diagnosis not present

## 2021-12-16 DIAGNOSIS — Z7982 Long term (current) use of aspirin: Secondary | ICD-10-CM | POA: Diagnosis not present

## 2021-12-16 DIAGNOSIS — Z5112 Encounter for antineoplastic immunotherapy: Secondary | ICD-10-CM

## 2021-12-16 DIAGNOSIS — R932 Abnormal findings on diagnostic imaging of liver and biliary tract: Secondary | ICD-10-CM | POA: Diagnosis not present

## 2021-12-16 DIAGNOSIS — C774 Secondary and unspecified malignant neoplasm of inguinal and lower limb lymph nodes: Secondary | ICD-10-CM | POA: Diagnosis not present

## 2021-12-16 DIAGNOSIS — I700 Atherosclerosis of aorta: Secondary | ICD-10-CM | POA: Diagnosis not present

## 2021-12-16 DIAGNOSIS — I2510 Atherosclerotic heart disease of native coronary artery without angina pectoris: Secondary | ICD-10-CM | POA: Diagnosis not present

## 2021-12-16 LAB — COMPREHENSIVE METABOLIC PANEL
ALT: 29 U/L (ref 0–44)
AST: 48 U/L — ABNORMAL HIGH (ref 15–41)
Albumin: 4 g/dL (ref 3.5–5.0)
Alkaline Phosphatase: 88 U/L (ref 38–126)
Anion gap: 3 — ABNORMAL LOW (ref 5–15)
BUN: 34 mg/dL — ABNORMAL HIGH (ref 8–23)
CO2: 22 mmol/L (ref 22–32)
Calcium: 9.1 mg/dL (ref 8.9–10.3)
Chloride: 114 mmol/L — ABNORMAL HIGH (ref 98–111)
Creatinine, Ser: 1.52 mg/dL — ABNORMAL HIGH (ref 0.61–1.24)
GFR, Estimated: 51 mL/min — ABNORMAL LOW (ref >60)
Glucose, Bld: 110 mg/dL — ABNORMAL HIGH (ref 70–99)
Potassium: 4.3 mmol/L (ref 3.5–5.1)
Sodium: 139 mmol/L (ref 135–145)
Total Bilirubin: 0.6 mg/dL (ref 0.3–1.2)
Total Protein: 7.4 g/dL (ref 6.5–8.1)

## 2021-12-16 LAB — CBC WITH DIFFERENTIAL/PLATELET
Abs Immature Granulocytes: 0.07 10*3/uL (ref 0.00–0.07)
Basophils Absolute: 0 10*3/uL (ref 0.0–0.1)
Basophils Relative: 1 %
Eosinophils Absolute: 0.3 10*3/uL (ref 0.0–0.5)
Eosinophils Relative: 5 %
HCT: 33.7 % — ABNORMAL LOW (ref 39.0–52.0)
Hemoglobin: 11.1 g/dL — ABNORMAL LOW (ref 13.0–17.0)
Immature Granulocytes: 2 %
Lymphocytes Relative: 28 %
Lymphs Abs: 1.3 10*3/uL (ref 0.7–4.0)
MCH: 29.4 pg (ref 26.0–34.0)
MCHC: 32.9 g/dL (ref 30.0–36.0)
MCV: 89.2 fL (ref 80.0–100.0)
Monocytes Absolute: 0.5 10*3/uL (ref 0.1–1.0)
Monocytes Relative: 9 %
Neutro Abs: 2.7 10*3/uL (ref 1.7–7.7)
Neutrophils Relative %: 55 %
Platelets: 195 10*3/uL (ref 150–400)
RBC: 3.78 MIL/uL — ABNORMAL LOW (ref 4.22–5.81)
RDW: 14.3 % (ref 11.5–15.5)
WBC: 4.8 10*3/uL (ref 4.0–10.5)
nRBC: 0 % (ref 0.0–0.2)

## 2021-12-16 MED ORDER — HEPARIN SOD (PORK) LOCK FLUSH 100 UNIT/ML IV SOLN
500.0000 [IU] | Freq: Once | INTRAVENOUS | Status: AC | PRN
  Administered 2021-12-16: 500 [IU]
  Filled 2021-12-16: qty 5

## 2021-12-16 MED ORDER — SODIUM CHLORIDE 0.9 % IV SOLN
Freq: Once | INTRAVENOUS | Status: AC
  Filled 2021-12-16: qty 250

## 2021-12-16 MED ORDER — SODIUM CHLORIDE 0.9 % IV SOLN
240.0000 mg | Freq: Once | INTRAVENOUS | Status: AC
  Administered 2021-12-16: 240 mg via INTRAVENOUS
  Filled 2021-12-16: qty 24

## 2021-12-16 MED ORDER — HEPARIN SOD (PORK) LOCK FLUSH 100 UNIT/ML IV SOLN
INTRAVENOUS | Status: AC
  Filled 2021-12-16: qty 5

## 2021-12-16 NOTE — Patient Instructions (Signed)
[facility] [place]  Discharge Instructions: Thank you for choosing [facility] to provide your oncology and hematology care.  If you have a lab appointment with the [facility], please go directly to the [facility] and check in at the registration area.  Wear comfortable clothing and clothing appropriate for easy access to any Portacath or PICC line.   We strive to give you quality time with your provider. You may need to reschedule your appointment if you arrive late (15 or more minutes).  Arriving late affects you and other patients whose appointments are after yours.  Also, if you miss three or more appointments without notifying the office, you may be dismissed from the clinic at the provider's discretion.      For prescription refill requests, have your pharmacy contact our office and allow 72 hours for refills to be completed.    Today you received the following chemotherapy and/or immunotherapy agents opvido       To help prevent nausea and vomiting after your treatment, we encourage you to take your nausea medication as directed.  BELOW ARE SYMPTOMS THAT SHOULD BE REPORTED IMMEDIATELY: *FEVER GREATER THAN 100.4 F (38 C) OR HIGHER *CHILLS OR SWEATING *NAUSEA AND VOMITING THAT IS NOT CONTROLLED WITH YOUR NAUSEA MEDICATION *UNUSUAL SHORTNESS OF BREATH *UNUSUAL BRUISING OR BLEEDING *URINARY PROBLEMS (pain or burning when urinating, or frequent urination) *BOWEL PROBLEMS (unusual diarrhea, constipation, pain near the anus) TENDERNESS IN MOUTH AND THROAT WITH OR WITHOUT PRESENCE OF ULCERS (sore throat, sores in mouth, or a toothache) UNUSUAL RASH, SWELLING OR PAIN  UNUSUAL VAGINAL DISCHARGE OR ITCHING   Items with * indicate a potential emergency and should be followed up as soon as possible or go to the Emergency Department if any problems should occur.  Please show the CHEMOTHERAPY ALERT CARD or IMMUNOTHERAPY ALERT CARD at check-in to the  Emergency Department and triage nurse.  Should you have questions after your visit or need to cancel or reschedule your appointment, please contact [facility] [place]  [phone] and follow the prompts.  Office hours are 8:00 a.m. to 4:30 p.m. Monday - Friday. Please note that voicemails left after 4:00 p.m. may not be returned until the following business day.  We are closed weekends and major holidays. You have access to a nurse at all times for urgent questions. Please call the main number to the clinic [phone] and follow the prompts.  For any non-urgent questions, you may also contact your provider using MyChart. We now offer e-Visits for anyone 41 and older to request care online for non-urgent symptoms. For details visit [email or web address].   Also download the MyChart app! Go to the app store, search "MyChart", open the app, select [facility], and log in with your MyChart username and password.  Masks are optional in the cancer centers. If you would like for your care team to wear a mask while they are taking care of you, please let them know. For doctor visits, patients may have with them one support person who is at least 62 years old. At this time, visitors are not allowed in the infusion area.

## 2021-12-16 NOTE — Assessment & Plan Note (Signed)
avoid nephrotoxins.   creatinine fluctuates. Encourage oral hydration.    

## 2021-12-16 NOTE — Assessment & Plan Note (Signed)
Left inguinal nodal recurrence of melanoma.  Status post resection and adjuvant radiation. S/p 2 years of Nivolumab maintenance.  Recent CT, enhanced [place], PET, and MRI  findings suspicious for metastatic disease.  Patient was discussed on tumor board, plan biopsy of the liver lesion Labs are reviewed and discussed with patient. Proceed with Nivolumab today Short term imaging follow up

## 2021-12-16 NOTE — Assessment & Plan Note (Signed)
Immunotherapy plan as listed above 

## 2021-12-16 NOTE — Assessment & Plan Note (Signed)
Iron panel showed iron deficiency.  Recommend ferrous sulfate '325mg'$  BID.  Continue monitor.

## 2021-12-16 NOTE — Progress Notes (Signed)
Hematology/Oncology Progress note [phone] [phone]      Patient Care Team: Mechele Claude, FNP as PCP - General (Family Medicine) [name], MD as Consulting Physician (Oncology) Mickeal Skinner, Acey Lav, MD as Consulting Physician (Oncology) [name], MD as Consulting Physician (Cardiology) Mechele Claude, FNP (Family Medicine)  ASSESSMENT & PLAN:   Cancer Staging  Malignant melanoma of overlapping sites [facility]) Staging form: Melanoma of the Skin, AJCC 8th Edition - Pathologic: Stage Unknown (rpTX, pN1b, cM0) - Signed by [name], MD on 07/27/2020 - Pathologic: No stage assigned - Unsigned   Malignant melanoma of overlapping sites [facility]) Left inguinal nodal recurrence of melanoma.  Status post resection and adjuvant radiation. S/p 2 years of Nivolumab maintenance.  Recent CT, enhanced [place], PET, and MRI  findings suspicious for metastatic disease.  Patient was discussed on tumor board, plan biopsy of the liver lesion Labs are reviewed and discussed with patient. Proceed with Nivolumab today Short term imaging follow up    Encounter for antineoplastic immunotherapy Immunotherapy plan as listed above  Stage 3a chronic kidney disease ([place]) avoid nephrotoxins.   creatinine fluctuates. Encourage oral hydration.     Anemia in chronic kidney disease Iron panel showed iron deficiency.  Recommend ferrous sulfate 335m BID.  Continue monitor.    No orders of the defined types were placed in this encounter.  Follow-up Lab MD 2 weeks Nivolumab All questions were answered. The patient knows to call the clinic with any problems, questions or concerns.  ZEarlie Server MD, PhD [facility] H[facility] Oncology 12/16/2021   CHIEF COMPLAINTS/REASON FOR VISIT:  Follow up for melanoma HISTORY OF PRESENTING ILLNESS:   SChristiaan Strebeckis a  61y.o.  male presents for recurrent malignant melanoma.   Oncology History  Malignant melanoma of overlapping sites  ([facility]  04/16/2019 Cancer Staging   Staging form: Melanoma of the Skin, AJCC 8th Edition - Pathologic: Stage Unknown (rpTX, pN1b, cM0) - Signed by YEarlie Server MD on 07/27/2020 Stage prefix: Recurrence    04/23/2019 Initial Diagnosis   Malignant melanoma   -He has a history of left lower extremity melanoma in 2011, status post local excision -04/16/2019 patient underwent left groin mass resection  Resection pathology showed malignant melanoma, replacing a lymph node, with extracapsular extension, peripheral and deep margins involved.  Left inguinal contents, all 7 lymph nodes were negative for melanoma in the lymph nodes. Extranodal melanoma identified in lymphatic and interstitium between nodes -PDL1 80% TPS    07/07/2019 -  Radiation Therapy   status post adjuvant radiation.   07/23/2019 - 07/21/2021 Chemotherapy   Nivolumab q14d      06/29/2020 Imaging   CT chest abdomen pelvis showed stable postoperative appearance of the left groin.  No evidence of local recurrence.  No evidence of metastatic disease in the chest abdomen or pelvis.  Hepatic steatosis.  Stable subcentimeter fluid attenuation lesion of the lateral right lobe of the liver, likely benign cyst or hemangioma.  Coronary artery disease.  Aortic atherosclerosis   10/20/2020 Imaging   CT chest abdomen pelvis showed stable postoperative/radiation appearance of the left groin.  No evidence of local recurrence/metastatic disease within the chest abdomen/pelvis.  Fatty liver disease.  Diverticulosis without evidence of typhlitis.  Aortic atherosclerosis   03/10/2021 Imaging   MRI brain without contrast showed no definitive evidence of intracranial metastatic disease.  Study is limited by absence of intravenous contrast.     04/26/2021 Imaging   CT chest abdomen pelvis without contrast  showed stable post operative changes of left groin with no evidence of recurrent disease.  No evidence of metastatic disease in the chest abdomen pelvis.   Aortic atherosclerosis   08/08/2021 - 08/16/2021 Hospital Admission    patient was hospitalized due to NSTEMI status post CABG x3.  He also had pacemaker The echocardiogram showed left ventricular ejection fraction of 65 to 70%,   09/15/2021 Imaging   CT chest abdomen pelvis w contrast  IMPRESSION: 1. Subtle hypodense 9 mm lesion in the left lobe of the liver is new from prior imaging including previous contrasted CT dating back to December 10, 2019, with the lesion appearing to equilibrate with background liver on delayed imaging sequence but is incompletely evaluated on this imaging study and technically nonspecific possibly reflecting a benign perfusional variant and while its appearance is not typical for that of a melanoma metastasis, it is not excluded on this examination. Suggest more definitive characterization by hepatic protocol MRI with and without contrast. 2. Stable postoperative changes in the left groin without evidence of local recurrent disease.3. No evidence of metastatic disease in the chest or pelvis.4.  Aortic Atherosclerosis (ICD10-I70.0).      09/23/2021 Imaging   Contrast-enhanced liver ultrasound Mildly hypoenhancing 2.2 cm mass in the posterior aspect of the left lobe of the liver with washout characteristics concerning for non hepatocellular malignancy, concerning for melanotic metastasis given history. The lesion is in an unfavorable location for percutaneous biopsy. Consider PET-CT for further characterization   10/21/2021 -  Chemotherapy   Nivolumab q14d      11/10/2021 Imaging   MRI Brain w wo contrast Negative for metastatic disease to the brain   11/10/2021 Imaging   MRI abdomen w wo contrast 1. Lesion of the posterior superior left lobe of the liver, hepatic segment II, measuring 1.9 x 1.8 cm corresponding to findings of prior imaging. Evaluation is somewhat limited breath motion artifact however there is subtle associated rim enhancement of this lesion.  Findings are most in keeping with a hepatic metastasis in the setting of known recurrent melanoma. 2. Mild hepatic steatosis. 3. Cardiomegaly.     04/18/2021, patient establish care with neurology Dr. Mickeal Skinner for intermittent altered cognition.  he was recommended to start Vimpat.    INTERVAL HISTORY Teng Decou is a 61 y.o. male who has above history reviewed by me today presents for follow up visit for management of inguinal nodal recurrence of melanoma, new liver lesion.  He reports no new complaints. Denies diarrhea, skin rash, abdominal pain.Spells are less common after his bypass surgery No new complaints  :Review of Systems  Constitutional:  Negative for appetite change, chills, fatigue, fever and unexpected weight change.  HENT:   Negative for hearing loss and voice change.   Eyes:  Negative for eye problems and icterus.  Respiratory:  Negative for chest tightness, cough and shortness of breath.   Cardiovascular:  Positive for leg swelling. Negative for chest pain.  Gastrointestinal:  Negative for abdominal distention and abdominal pain.  Endocrine: Negative for hot flashes.  Genitourinary:  Negative for difficulty urinating, dysuria and frequency.   Musculoskeletal:        Status post left hip replacement  Skin:  Negative for itching and rash.       Skin hypo-pigmentation on upper extremities, no change  Neurological:  Negative for light-headedness and numbness.       Spells  Hematological:  Negative for adenopathy. Does not bruise/bleed easily.  Psychiatric/Behavioral:  Negative for confusion.  MEDICAL HISTORY:  Past Medical History:  Diagnosis Date   Anemia    iron treatments   Anxiety    Aortic atherosclerosis (HCC)    Arthritis    Cancer of groin ([place]) 2021   left groin, resected, radiation   Cataract    Complication of anesthesia    PONV   Coronary artery disease    Dizziness of unknown etiology    has led to seizures and passing out.   Family history of  adverse reaction to anesthesia    PONV mother   GERD (gastroesophageal reflux disease)    History of complete heart block    PPM placed   Hyperlipidemia    Hypertension    LBBB (left bundle branch block)    Lymphedema of left leg    uses thigh high compression stockings   Melanoma ([place]) 2012   skin cancer, left thigh   OSA on CPAP    PONV (postoperative nausea and vomiting) 04/16/2019   Port-A-Cath in place    RIGHT chest wall   Presence of cardiac pacemaker    [employer]   Seizures ([place])    still has episodes of dizziness. last event 1 month ago (march 2022) and will pass out. takes clonazepam    SURGICAL HISTORY: Past Surgical History:  Procedure Laterality Date   CORONARY ARTERY BYPASS GRAFT N/A 08/10/2021   Procedure: CORONARY ARTERY BYPASS GRAFTING (CABG) X 3 USING LEFT INTERNAL MAMMARY ARTERY AND RIGHT GREATER SAPHENOUS VEIN;  Surgeon: Dahlia Byes, MD;  Location: [place];  Service: Open Heart Surgery;  Laterality: N/A;   CT RADIATION THERAPY GUIDE     left groin   dental implant     permanent implant   ENDOVEIN HARVEST OF GREATER SAPHENOUS VEIN Right 08/10/2021   Procedure: ENDOVEIN HARVEST OF GREATER SAPHENOUS VEIN;  Surgeon: Dahlia Byes, MD;  Location: [place];  Service: Open Heart Surgery;  Laterality: Right;   KNEE SURGERY Left    arthroscopy   LEFT HEART CATH AND CORONARY ANGIOGRAPHY Left 06/29/2017   Procedure: LEFT HEART CATH AND CORONARY ANGIOGRAPHY;  Surgeon: [name], MD;  Location: [place] [facility];  Service: Cardiovascular;  Laterality: Left;   LEFT HEART CATH AND CORONARY ANGIOGRAPHY N/A 08/08/2021   Procedure: LEFT HEART CATH AND CORONARY ANGIOGRAPHY;  Surgeon: [name], MD;  Location: [place] [facility];  Service: Cardiovascular;  Laterality: N/A;   LYMPH NODE DISSECTION Left 04/16/2019   Procedure: Left inguinal Lymph Node Dissection;  Surgeon: [name], MD;  Location: [place];  Service: General;  Laterality: Left;   MELANOMA  EXCISION Left 04/16/2019   Procedure: MELANOMA EXCISION LEFT GROIN MASS;  Surgeon: [name], MD;  Location: [place];  Service: General;  Laterality: Left;   MELANOMA EXCISION WITH SENTINEL LYMPH NODE BIOPSY Left 2012   Left calf    PACEMAKER INSERTION N/A 08/26/2018   Procedure: INSERTION PACEMAKER;  Surgeon: [name], MD;  Location: [facility];  Service: Cardiovascular;  Laterality: N/A;   PORTA CATH INSERTION N/A 08/26/2019   Procedure: PORTA CATH INSERTION;  Surgeon: Katha Cabal, MD;  Location: [place] [facility];  Service: Cardiovascular;  Laterality: N/A;   SUPERFICIAL LYMPH NODE BIOPSY / EXCISION Left 2020   lymph nodes removed around left groin melanoma site   TEE WITHOUT CARDIOVERSION N/A 08/10/2021   Procedure: TRANSESOPHAGEAL ECHOCARDIOGRAM (TEE);  Surgeon: Dahlia Byes, MD;  Location: [place];  Service: Open Heart Surgery;  Laterality: N/A;   TEMPORARY PACEMAKER N/A  08/25/2018   Procedure: TEMPORARY PACEMAKER;  Surgeon: [name], MD;  Location: [place] [facility];  Service: Cardiovascular;  Laterality: N/A;   TOTAL HIP ARTHROPLASTY Left 07/14/2020   Procedure: TOTAL HIP ARTHROPLASTY;  Surgeon: Dereck Leep, MD;  Location: [facility];  Service: Orthopedics;  Laterality: Left;    SOCIAL HISTORY: Social History   Socioeconomic History   Marital status: Married    Spouse name: Vicente Males    Number of children: 7   Years of education: 12   Highest education level: Not on file  Occupational History    Comment: disability  Tobacco Use   Smoking status: Never   Smokeless tobacco: Never  Vaping Use   Vaping Use: Never used  Substance and Sexual Activity   Alcohol use: No   Drug use: No   Sexual activity: Not Currently  Other Topics Concern   Not on file  Social History Narrative   Lives with  Wife,   Has 2 small dogs   Caffeine use: sodas (2 per day)      Out of work on disability.  Has a walk in shower. No stairs to climb   Oncology treatment  ongoing. Uses port a cath for treatment.      pacemaker   Social Determinants of Health   Financial Resource Strain: Low Risk  (02/12/2019)   Overall Financial Resource Strain (CARDIA)    Difficulty of Paying Living Expenses: Not hard at all  Food Insecurity: No Food Insecurity (02/12/2019)   Hunger Vital Sign    Worried About Running Out of Food in the Last Year: Never true    Ran Out of Food in the Last Year: Never true  Transportation Needs: Unmet Transportation Needs (02/12/2019)   PRAPARE - Hydrologist (Medical): Yes    Lack of Transportation (Non-Medical): Yes  Physical Activity: Unknown (02/12/2019)   Exercise Vital Sign    Days of Exercise per Week: 0 days    Minutes of Exercise per Session: Not on file  Stress: No Stress Concern Present (02/12/2019)   [place]    Feeling of Stress : Only a little  Social Connections: Unknown (02/12/2019)   Social Connection and Isolation Panel [NHANES]    Frequency of Communication with Friends and Family: More than three times a week    Frequency of Social Gatherings with Friends and Family: Not on file    Attends Religious Services: Not on file    Active Member of Clubs or Organizations: Not on file    Attends Archivist Meetings: Not on file    Marital Status: Married  Intimate Partner Violence: Not At Risk (02/12/2019)   Humiliation, Afraid, Rape, and Kick questionnaire    Fear of Current or Ex-Partner: No    Emotionally Abused: No    Physically Abused: No    Sexually Abused: No    FAMILY HISTORY: Family History  Problem Relation Age of Onset   Cancer Paternal Grandmother     ALLERGIES:  is allergic to ibuprofen, levetiracetam, and nsaids.  MEDICATIONS:  Current Outpatient Medications  Medication Sig Dispense Refill   atorvastatin (LIPITOR) 20 MG tablet Take 20 mg by mouth daily.     Carboxymeth-Glyc-Polysorb PF  (REFRESH OPTIVE MEGA-3) 0.5-1-0.5 % SOLN Place 1 drop into both eyes daily as needed (dry eyes).     clonazePAM (KLONOPIN) 0.5 MG tablet TAKE 0.5MG IN AM, 1MG IN PM 90 tablet 0  febuxostat (ULORIC) 40 MG tablet Take 40 mg by mouth daily.     ferrous sulfate 325 (65 FE) MG EC tablet TAKE 1 TABLET BY MOUTH 2 TIMES DAILY WITH A MEAL. 180 tablet 1   hydrochlorothiazide (HYDRODIURIL) 25 MG tablet      hydrocortisone 2.5 % ointment Apply 1 application. topically daily as needed (itching).     Lacosamide (VIMPAT) 100 MG TABS TAKE 1 TABLET (100 MG TOTAL) BY MOUTH IN THE MORNING AND AT BEDTIME. 60 tablet 3   lidocaine-prilocaine (EMLA) cream Apply 1 application. topically as needed. Apply small amount of cream to port site approx 1-2 hours prior to appointment. 30 g 11   lisinopril (ZESTRIL) 20 MG tablet Take 20 mg by mouth daily.     LORazepam (ATIVAN) 2 MG tablet TAKE 1 TABLET (2 MG TOTAL) BY MOUTH EVERY 8 (EIGHT) HOURS AS NEEDED FOR SEIZURE. 20 tablet 0   metoprolol succinate (TOPROL-XL) 25 MG 24 hr tablet Take 25 mg by mouth daily.     Multiple Vitamin (MULTIVITAMIN WITH MINERALS) TABS tablet Take 1 tablet by mouth daily. Centrum Silver     omeprazole (PRILOSEC) 20 MG capsule TAKE 1 CAPSULE BY MOUTH EVERY DAY 90 capsule 0   ondansetron (ZOFRAN) 4 MG tablet TAKE 1 TABLET BY MOUTH EVERY 8 HOURS AS NEEDED FOR NAUSEA AND VOMITING 90 tablet 1   aspirin 81 MG EC tablet Take 81 mg by mouth daily. (Patient not taking: Reported on 12/16/2021)     No current facility-administered medications for this visit.   Facility-Administered Medications Ordered in Other Visits  Medication Dose Route Frequency Provider Last Rate Last Admin   heparin lock flush 100 UNIT/ML injection            heparin lock flush 100 UNIT/ML injection              PHYSICAL EXAMINATION: ECOG PERFORMANCE STATUS: 1 - Symptomatic but completely ambulatory Vitals:   12/16/21 0903  BP: 130/72  Pulse: 88  Resp: 20  Temp: 97.8 F (36.6  C)  SpO2: 99%   Filed Weights   12/16/21 0903  Weight: 244 lb 12.8 oz (111 kg)    Physical Exam Constitutional:      General: He is not in acute distress.    Comments: Patient ambulates independently  HENT:     Head: Normocephalic and atraumatic.  Eyes:     General: No scleral icterus.    Pupils: Pupils are equal, round, and reactive to light.  Cardiovascular:     Rate and Rhythm: Normal rate and regular rhythm.     Heart sounds: Normal heart sounds.  Pulmonary:     Effort: Pulmonary effort is normal. No respiratory distress.     Breath sounds: No wheezing.  Abdominal:     General: Bowel sounds are normal. There is no distension.     Palpations: Abdomen is soft. There is no mass.     Tenderness: There is no abdominal tenderness.     Comments:    Musculoskeletal:        General: No deformity. Normal range of motion.     Cervical back: Normal range of motion and neck supple.     Comments: Left lower extremity edema-chronic   Skin:    General: Skin is warm and dry.  Neurological:     Mental Status: He is alert and oriented to person, place, and time. Mental status is at baseline.     Cranial Nerves: No cranial nerve deficit.  Coordination: Coordination normal.  Psychiatric:        Mood and Affect: Mood normal.     LABORATORY DATA:  I have reviewed the data as listed    Latest Ref Rng & Units 12/16/2021    8:51 AM 12/02/2021    9:16 AM 11/18/2021    8:37 AM  CBC  WBC 4.0 - 10.5 K/uL 4.8  5.1  5.0   Hemoglobin 13.0 - 17.0 g/dL 11.1  11.2  11.2   Hematocrit 39.0 - 52.0 % 33.7  33.8  34.2   Platelets 150 - 400 K/uL 195  189  205       Latest Ref Rng & Units 12/16/2021    8:51 AM 12/02/2021    9:16 AM 11/18/2021    8:37 AM  CMP  Glucose 70 - 99 mg/dL 110  137  120   BUN 8 - 23 mg/dL 34  40  36   Creatinine 0.61 - 1.24 mg/dL 1.52  1.72  1.51   Sodium 135 - 145 mmol/L 139  139  137   Potassium 3.5 - 5.1 mmol/L 4.3  4.5  4.3   Chloride 98 - 111 mmol/L 114  113   109   CO2 22 - 32 mmol/L _0 Calcium 8.9 - 10.3 mg/dL 9.1  8.9  8.9   Total Protein 6.5 - 8.1 g/dL 7.4  6.8  7.4   Total Bilirubin 0.3 - 1.2 mg/dL 0.6  0.4  0.5   Alkaline Phos 38 - 126 U/L 88  76  89   AST 15 - 41 U/L 48  28  25   ALT 0 - 44 U/L 29  35  28      RADIOGRAPHIC STUDIES: I have personally reviewed the radiological images as listed and agreed with the findings in the report. MR Brain W Wo Contrast  Result Date: 11/12/2021 CLINICAL DATA:  Melanoma staging EXAM: MRI HEAD WITHOUT AND WITH CONTRAST TECHNIQUE: Multiplanar, multiecho pulse sequences of the brain and surrounding structures were obtained without and with intravenous contrast. CONTRAST:  46m GADAVIST GADOBUTROL 1 MMOL/ML IV SOLN COMPARISON:  10/06/2021 PET-CT FINDINGS: Brain: No swelling or enhancement to suggest metastatic disease. No central infarct, hemorrhage, hydrocephalus, or collection. Vascular: Normal flow voids and vascular enhancements. Skull and upper cervical spine: Negative for marrow lesion. Sinuses/Orbits: Negative. Other: The left parotid mass by PET CT is not well assessed on this brain MRI. IMPRESSION: Negative for metastatic disease to the brain. Electronically Signed   By: JJorje GuildM.D.   On: 11/12/2021 19:00   MR Abdomen W Wo Contrast  Result Date: 11/12/2021 CLINICAL DATA:  Malignant melanoma, left inguinal nodal recurrence, evaluate suspicious and FDG avid left lobe liver lesion EXAM: MRI ABDOMEN WITHOUT AND WITH CONTRAST TECHNIQUE: Multiplanar multisequence MR imaging of the abdomen was performed both before and after the administration of intravenous contrast. CONTRAST:  132mGADAVIST GADOBUTROL 1 MMOL/ML IV SOLN COMPARISON:  PET-CT, 10/06/2021, CT chest abdomen pelvis, 09/15/2021, contrast enhanced ultrasound, 09/23/2021 FINDINGS: Lower chest: No acute abnormality.  Cardiomegaly. Hepatobiliary: Mild hepatic steatosis. Subtly T2 hyperintense lesion of the posterior superior left lobe  of the liver, hepatic segment II, measuring 1.9 x 1.8 cm (series 5, image 15). Evaluation is somewhat limited in this vicinity, particularly on contrast enhanced sequences, however there is subtle associated rim enhancement of this lesion (e.g. Series 16, image 37). Simple, benign fluid signal cysts of the right lobe of the liver measuring  up to 0.7 cm (series 5, image 21). No gallstones, gallbladder wall thickening, or biliary dilatation. Pancreas: Unremarkable. No pancreatic ductal dilatation or surrounding inflammatory changes. Spleen: Normal in size without significant abnormality. Adrenals/Urinary Tract: Adrenal glands are unremarkable. Simple, benign bilateral fluid signal renal cortical cysts for which no further follow-up or characterization is required. Kidneys are otherwise normal, without renal calculi, solid lesion, or hydronephrosis. Stomach/Bowel: Stomach is within normal limits. No evidence of bowel wall thickening, distention, or inflammatory changes. Vascular/Lymphatic: No significant vascular findings are present. No enlarged abdominal lymph nodes. Other: No abdominal wall hernia or abnormality. No ascites. Musculoskeletal: No acute or significant osseous findings. IMPRESSION: 1. Lesion of the posterior superior left lobe of the liver, hepatic segment II, measuring 1.9 x 1.8 cm corresponding to findings of prior imaging. Evaluation is somewhat limited breath motion artifact however there is subtle associated rim enhancement of this lesion. Findings are most in keeping with a hepatic metastasis in the setting of known recurrent melanoma. 2. Mild hepatic steatosis. 3. Cardiomegaly. Electronically Signed   By: Delanna Ahmadi M.D.   On: 11/12/2021 13:29   NM PET Image Restage (PS) Whole Body  Result Date: 10/08/2021 CLINICAL DATA:  Subsequent treatment strategy for metastatic melanoma with left inguinal nodal recurrence. New liver lesion. EXAM: NUCLEAR MEDICINE PET WHOLE BODY TECHNIQUE: 13.2 mCi  F-18 FDG was injected intravenously. Full-ring PET imaging was performed from the head to foot after the radiotracer. CT data was obtained and used for attenuation correction and anatomic localization. Fasting blood glucose: 106 mg/dl COMPARISON:  PET-CT 02/11/2019. CT of the chest, abdomen and pelvis 09/15/2021. FINDINGS: Mediastinal blood pool activity: SUV max 3.2 HEAD/ NECK: There is a small hypermetabolic nodule posteriorly in the superficial portion of the left parotid gland (SUV max 3.4). This appears similar to the previous PET-CT and is likely an incidental parotid lesion. No other hypermetabolic cervical lymph nodes are seen.There are no lesions of the pharyngeal mucosal space. Incidental CT findings: none CHEST: There are no hypermetabolic mediastinal, hilar or axillary lymph nodes. No hypermetabolic pulmonary activity or suspicious nodularity. Incidental CT findings: There is hypermetabolic activity within the median sternotomy, likely physiologic. The median sternotomy has incompletely healed, but demonstrates no dehiscence. Right IJ Port-A-Cath and left subclavian pacemaker leads are in place. ABDOMEN/PELVIS: There is focal hypermetabolic activity in the area of concern within the left hepatic lobe. The new lesion seen on CT is not well demonstrated on the CT images, but there is focal hypermetabolic activity in this area (SUV max 4.5). No other hypermetabolic liver lesions are seen. There is no hypermetabolic activity within the adrenal glands, pancreas or spleen. There is no hypermetabolic nodal activity. Previously demonstrated large left nodal mass has been surgically removed, and no residual hypermetabolic activity is present in this area. Incidental CT findings: Stable umbilical hernia containing only fat, colonic diverticulosis and aortic atherosclerosis. SKELETON: There is no hypermetabolic activity to suggest osseous metastatic disease. Incidental CT findings: none EXTREMITIES: There is  low-level hypermetabolic activity within the subcutaneous fat medial to the left knee, corresponding with surgical clips and attributed to previous saphenous venous harvesting for CABG. Correlate clinically. No other suspicious soft tissue activity within the extremities. Incidental CT findings: none IMPRESSION: 1. The new lesion of concern in the left hepatic lobe is not optimally evaluated based on small size, but does demonstrate hypermetabolic activity, suspicious for metastatic disease. Assuming the patient is unable to undergo MRI due to his pacemaker, recommend attention on follow-up CT in 3  months. 2. No other evidence of metastatic disease. 3. 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 Electronically Signed   By: Richardean Sale M.D.   On: 10/08/2021 15:01   US LIVER W/CM 1ST LESION  Result Date: 09/23/2021 CLINICAL DATA:  61 year old male with history of melanoma with development of new left lobe lesion measuring up to 9 mm on recent staging CT. The patient presents for contrast enhanced ultrasound for further characterization. EXAM: US LIVER WITH CONTRAST CONTRAST:  2.4 mL sulfur hexafluoride Number of injections: 1 COMPARISON:  09/15/2021 FINDINGS: LIVER Size: Normal. Echogenicity: Within normal limits. Surface Contour: Smooth. Observation 1 Lobe: Left Size: 2.2 cm Grayscale features: Hypoechoic with isoechoic center. Arterial phase enhancement features: Mildly hypoenhancing Onset of washout: 60 seconds Degree of washout: Marked Tumor in vein: No CEUS LI-RADS category: Not applicable in the absence of cirrhosis. IMPRESSION: Mildly hypoenhancing 2.2 cm mass in the posterior aspect of the left lobe of the liver with washout characteristics concerning for non hepatocellular malignancy, concerning for melanotic metastasis given history. The lesion is in an unfavorable location for percutaneous biopsy. Consider PET-CT for further  characterization. Ruthann Cancer, MD [facility] Radiology Electronically Signed   By: Ruthann Cancer M.D.   On: 09/23/2021 15:39

## 2021-12-19 ENCOUNTER — Other Ambulatory Visit: Payer: Self-pay

## 2021-12-19 ENCOUNTER — Ambulatory Visit
Admission: RE | Admit: 2021-12-19 | Discharge: 2021-12-19 | Disposition: A | Discharge status: Home / Self Care | Payer: Medicare Other | Source: Ambulatory Visit | Attending: Oncology | Admitting: Oncology

## 2021-12-19 DIAGNOSIS — C227 Other specified carcinomas of liver: Secondary | ICD-10-CM | POA: Diagnosis not present

## 2021-12-19 DIAGNOSIS — Z85820 Personal history of malignant melanoma of skin: Secondary | ICD-10-CM | POA: Insufficient documentation

## 2021-12-19 DIAGNOSIS — K769 Liver disease, unspecified: Secondary | ICD-10-CM

## 2021-12-19 DIAGNOSIS — C779 Secondary and unspecified malignant neoplasm of lymph node, unspecified: Secondary | ICD-10-CM

## 2021-12-19 DIAGNOSIS — R160 Hepatomegaly, not elsewhere classified: Secondary | ICD-10-CM | POA: Diagnosis not present

## 2021-12-19 DIAGNOSIS — C787 Secondary malignant neoplasm of liver and intrahepatic bile duct: Secondary | ICD-10-CM | POA: Diagnosis not present

## 2021-12-19 DIAGNOSIS — C438 Malignant melanoma of overlapping sites of skin: Secondary | ICD-10-CM

## 2021-12-19 LAB — CBC
HCT: 35.3 % — ABNORMAL LOW (ref 39.0–52.0)
Hemoglobin: 11.6 g/dL — ABNORMAL LOW (ref 13.0–17.0)
MCH: 29.4 pg (ref 26.0–34.0)
MCHC: 32.9 g/dL (ref 30.0–36.0)
MCV: 89.4 fL (ref 80.0–100.0)
Platelets: 216 10*3/uL (ref 150–400)
RBC: 3.95 MIL/uL — ABNORMAL LOW (ref 4.22–5.81)
RDW: 14 % (ref 11.5–15.5)
WBC: 5.7 10*3/uL (ref 4.0–10.5)
nRBC: 0 % (ref 0.0–0.2)

## 2021-12-19 LAB — PROTIME-INR
INR: 1.1 (ref 0.8–1.2)
Prothrombin Time: 14 seconds (ref 11.4–15.2)

## 2021-12-19 MED ORDER — HEPARIN SOD (PORK) LOCK FLUSH 100 UNIT/ML IV SOLN
INTRAVENOUS | Status: AC
  Administered 2021-12-19: 500 [IU] via INTRAVENOUS
  Filled 2021-12-19: qty 5

## 2021-12-19 MED ORDER — SODIUM CHLORIDE 0.9 % IV SOLN: INTRAVENOUS | Status: DC

## 2021-12-19 MED ORDER — MIDAZOLAM HCL 2 MG/2ML IJ SOLN
INTRAMUSCULAR | Status: AC
  Filled 2021-12-19: qty 2

## 2021-12-19 MED ORDER — FENTANYL CITRATE (PF) 100 MCG/2ML IJ SOLN
INTRAMUSCULAR | Status: AC
  Filled 2021-12-19: qty 2

## 2021-12-19 MED ORDER — HEPARIN SOD (PORK) LOCK FLUSH 100 UNIT/ML IV SOLN: 500.0000 [IU] | Freq: Once | INTRAVENOUS | Status: AC

## 2021-12-19 MED ORDER — FENTANYL CITRATE (PF) 100 MCG/2ML IJ SOLN
INTRAMUSCULAR | Status: AC | PRN
  Administered 2021-12-19 (×2): 50 ug via INTRAVENOUS

## 2021-12-19 MED ORDER — MIDAZOLAM HCL 2 MG/2ML IJ SOLN
INTRAMUSCULAR | Status: AC | PRN
  Administered 2021-12-19 (×2): 1 mg via INTRAVENOUS

## 2021-12-19 NOTE — H&P (Signed)
Chief Complaint: Liver lesions. Request is for liver lesion biopsy.   Referring Physician(s): Yu,Zhou  Supervising Physician: Ruthann Cancer  Patient Status: [facility] - Out-pt  History of Present Illness: Edward Pitts is a 61 y.o. male outpatient. 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 Found to have a liver lesion. PET scan from 7.13.23. The new lesion of concern in the left hepatic lobe is not optimally evaluated based on small size, but does demonstrate hypermetabolic activity, suspicious for metastatic disease. Assuming the patient is unable to undergo MRI due to his pacemaker, recommend attention on follow-up CT in 3 months. MR Abdomen from 8,17.23 reads. Lesion of the posterior superior left lobe of the liver, hepatic segment II, measuring 1.9 x 1.8 cm corresponding to findings of prior imaging. Evaluation is somewhat limited breath motion artifact however there is subtle associated rim enhancement of this lesion. Findings are most in keeping with a hepatic metastasis in the setting of known recurrent melanoma. Team is requesting a liver biopsy for further evaluation.  Sister at bedside. Patient alert and laying in bed,calm. Denies any fevers, headache, chest pain, SOB, cough, abdominal pain, nausea, vomiting or bleeding. Return precautions and treatment recommendations and follow-up discussed with the patient  who is agreeable with the plan.   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   Family history of adverse reaction to anesthesia    PONV mother   GERD (gastroesophageal reflux disease)    History of complete  heart block    PPM placed   Hyperlipidemia    Hypertension    LBBB (left bundle branch block)    Lymphedema of left leg    uses thigh high compression stockings   Melanoma ([place]) 2012   skin cancer, left thigh   OSA on CPAP    PONV (postoperative nausea and vomiting) 04/16/2019   Port-A-Cath in place    RIGHT chest wall   Presence of cardiac pacemaker    [employer]   Seizures ([place])    still has episodes of dizziness. last event 1 month ago (march 2022) and will pass out. takes clonazepam    Past Surgical History:  Procedure Laterality Date   CORONARY ARTERY BYPASS GRAFT N/A 08/10/2021   Procedure: CORONARY ARTERY BYPASS GRAFTING (CABG) X 3 USING LEFT INTERNAL MAMMARY ARTERY AND RIGHT GREATER SAPHENOUS VEIN;  Surgeon: Dahlia Byes, MD;  Location: [place];  Service: Open Heart Surgery;  Laterality: N/A;   CT RADIATION THERAPY GUIDE     left groin   dental implant     permanent implant   ENDOVEIN HARVEST OF GREATER SAPHENOUS VEIN Right 08/10/2021   Procedure: ENDOVEIN HARVEST OF GREATER SAPHENOUS VEIN;  Surgeon: Dahlia Byes, MD;  Location: [place];  Service: Open Heart Surgery;  Laterality: Right;   KNEE SURGERY Left    arthroscopy   LEFT HEART CATH AND CORONARY ANGIOGRAPHY Left 06/29/2017   Procedure: LEFT HEART CATH AND CORONARY ANGIOGRAPHY;  Surgeon: [name], MD;  Location: [place] [facility];  Service: Cardiovascular;  Laterality: Left;  LEFT HEART CATH AND CORONARY ANGIOGRAPHY N/A 08/08/2021   Procedure: LEFT HEART CATH AND CORONARY ANGIOGRAPHY;  Surgeon: [name], MD;  Location: [place] [facility];  Service: Cardiovascular;  Laterality: N/A;   LYMPH NODE DISSECTION Left 04/16/2019   Procedure: Left inguinal Lymph Node Dissection;  Surgeon: [name], MD;  Location: [place];  Service: General;  Laterality: Left;   MELANOMA EXCISION Left 04/16/2019   Procedure: MELANOMA EXCISION LEFT GROIN MASS;  Surgeon: [name], MD;  Location: [place];  Service:  General;  Laterality: Left;   MELANOMA EXCISION WITH SENTINEL LYMPH NODE BIOPSY Left 2012   Left calf    PACEMAKER INSERTION N/A 08/26/2018   Procedure: INSERTION PACEMAKER;  Surgeon: [name], MD;  Location: [facility];  Service: Cardiovascular;  Laterality: N/A;   PORTA CATH INSERTION N/A 08/26/2019   Procedure: PORTA CATH INSERTION;  Surgeon: Katha Cabal, MD;  Location: [place] [facility];  Service: Cardiovascular;  Laterality: N/A;   SUPERFICIAL LYMPH NODE BIOPSY / EXCISION Left 2020   lymph nodes removed around left groin melanoma site   TEE WITHOUT CARDIOVERSION N/A 08/10/2021   Procedure: TRANSESOPHAGEAL ECHOCARDIOGRAM (TEE);  Surgeon: Dahlia Byes, MD;  Location: [place];  Service: Open Heart Surgery;  Laterality: N/A;   TEMPORARY PACEMAKER N/A 08/25/2018   Procedure: TEMPORARY PACEMAKER;  Surgeon: [name], MD;  Location: [place] [facility];  Service: Cardiovascular;  Laterality: N/A;   TOTAL HIP ARTHROPLASTY Left 07/14/2020   Procedure: TOTAL HIP ARTHROPLASTY;  Surgeon: Dereck Leep, MD;  Location: [facility];  Service: Orthopedics;  Laterality: Left;    Allergies: Ibuprofen, Levetiracetam, and Nsaids  Medications: Prior to Admission medications   Medication Sig Start Date End Date Taking? Authorizing Provider  aspirin 81 MG EC tablet Take 81 mg by mouth daily. Patient not taking: Reported on 12/16/2021    [provider]  atorvastatin (LIPITOR) 20 MG tablet Take 20 mg by mouth daily. 07/15/18   [provider]  Carboxymeth-Glyc-Polysorb PF (REFRESH OPTIVE MEGA-3) 0.5-1-0.5 % SOLN Place 1 drop into both eyes daily as needed (dry eyes).    [provider]  clonazePAM (KLONOPIN) 0.5 MG tablet TAKE 0.'5MG'$  IN AM, '1MG'$  IN PM 11/22/21   Vaslow, Acey Lav, MD  febuxostat (ULORIC) 40 MG tablet Take 40 mg by mouth daily. 11/03/21   [provider]  ferrous sulfate 325 (65 FE) MG EC tablet TAKE 1 TABLET BY MOUTH 2 TIMES DAILY WITH A  MEAL. 12/14/21   [name], MD  hydrochlorothiazide (HYDRODIURIL) 25 MG tablet     [provider]  hydrocortisone 2.5 % ointment Apply 1 application. topically daily as needed (itching).    [provider]  Lacosamide (VIMPAT) 100 MG TABS TAKE 1 TABLET (100 MG TOTAL) BY MOUTH IN THE MORNING AND AT BEDTIME. 10/24/21   Vaslow, Acey Lav, MD  lidocaine-prilocaine (EMLA) cream Apply 1 application. topically as needed. Apply small amount of cream to port site approx 1-2 hours prior to appointment. 07/21/21   [name], MD  lisinopril (ZESTRIL) 20 MG tablet Take 20 mg by mouth daily.    [provider]  LORazepam (ATIVAN) 2 MG tablet TAKE 1 TABLET (2 MG TOTAL) BY MOUTH EVERY 8 (EIGHT) HOURS AS NEEDED FOR SEIZURE. 11/22/21   Vaslow, Acey Lav, MD  metoprolol succinate (TOPROL-XL) 25 MG 24 hr tablet Take 25 mg by mouth daily. 12/15/18   [provider]  Multiple Vitamin (MULTIVITAMIN WITH MINERALS) TABS tablet Take 1  tablet by mouth daily. Centrum Silver    [provider]  omeprazole (PRILOSEC) 20 MG capsule TAKE 1 CAPSULE BY MOUTH EVERY DAY 11/26/21   [name], MD  ondansetron (ZOFRAN) 4 MG tablet TAKE 1 TABLET BY MOUTH EVERY 8 HOURS AS NEEDED FOR NAUSEA AND VOMITING 10/10/21   Borders, Kirt Boys, NP     Family History  Problem Relation Age of Onset   Cancer Paternal Grandmother     Social History   Socioeconomic History   Marital status: Married    Spouse name: Vicente Males    Number of children: 7   Years of education: 12   Highest education level: Not on file  Occupational History    Comment: disability  Tobacco Use   Smoking status: Never   Smokeless tobacco: Never  Vaping Use   Vaping Use: Never used  Substance and Sexual Activity   Alcohol use: No   Drug use: No   Sexual activity: Not Currently  Other Topics Concern   Not on file  Social History Narrative   Lives with  Wife,   Has 2 small dogs   Caffeine use: sodas (2 per day)      Out of work on  disability.  Has a walk in shower. No stairs to climb   Oncology treatment ongoing. Uses port a cath for treatment.      pacemaker   Social Determinants of Health   Financial Resource Strain: Low Risk  (02/12/2019)   Overall Financial Resource Strain (CARDIA)    Difficulty of Paying Living Expenses: Not hard at all  Food Insecurity: No Food Insecurity (02/12/2019)   Hunger Vital Sign    Worried About Running Out of Food in the Last Year: Never true    Ran Out of Food in the Last Year: Never true  Transportation Needs: Unmet Transportation Needs (02/12/2019)   PRAPARE - Hydrologist (Medical): Yes    Lack of Transportation (Non-Medical): Yes  Physical Activity: Unknown (02/12/2019)   Exercise Vital Sign    Days of Exercise per Week: 0 days    Minutes of Exercise per Session: Not on file  Stress: No Stress Concern Present (02/12/2019)   [place]    Feeling of Stress : Only a little  Social Connections: Unknown (02/12/2019)   Social Connection and Isolation Panel [NHANES]    Frequency of Communication with Friends and Family: More than three times a week    Frequency of Social Gatherings with Friends and Family: Not on file    Attends Religious Services: Not on file    Active Member of Clubs or Organizations: Not on file    Attends Archivist Meetings: Not on file    Marital Status: Married      Review of Systems: A 12 point ROS discussed and pertinent positives are indicated in the HPI above.  All other systems are negative.  Review of Systems  Constitutional:  Negative for fever.  HENT:  Negative for congestion.   Respiratory:  Negative for cough and shortness of breath.   Cardiovascular:  Negative for chest pain.  Gastrointestinal:  Negative for abdominal pain.  Neurological:  Negative for headaches.  Psychiatric/Behavioral:  Negative for behavioral problems and  confusion.     Vital Signs: There were no vitals taken for this visit.    Physical Exam Vitals and nursing note reviewed.  Constitutional:      Appearance: He  is well-developed. He is obese.  HENT:     Head: Normocephalic.  Cardiovascular:     Rate and Rhythm: Normal rate and regular rhythm.     Heart sounds: Normal heart sounds.     Comments: Surgical scar noted. Right sided portacath in place Pulmonary:     Effort: Pulmonary effort is normal.     Breath sounds: Normal breath sounds.  Musculoskeletal:        General: Normal range of motion.     Cervical back: Normal range of motion.  Skin:    General: Skin is dry.  Neurological:     Mental Status: He is alert and oriented to person, place, and time.     Imaging: No results found.  Labs:  CBC: Recent Labs    11/04/21 0906 11/18/21 0837 12/02/21 0916 12/16/21 0851  WBC 6.3 5.0 5.1 4.8  HGB 10.4* 11.2* 11.2* 11.1*  HCT 31.5* 34.2* 33.8* 33.7*  PLT 203 205 189 195    COAGS: Recent Labs    08/06/21 0500 08/06/21 0535 08/10/21 1602  INR  --  1.1 1.3*  APTT 51*  --  30    BMP: Recent Labs    11/04/21 0906 11/18/21 0837 12/02/21 0916 12/16/21 0851  NA 139 137 139 139  K 4.1 4.3 4.5 4.3  CL 109 109 113* 114*  CO2 20* 20* 21* 22  GLUCOSE 125* 120* 137* 110*  BUN 48* 36* 40* 34*  CALCIUM 8.9 8.9 8.9 9.1  CREATININE 2.07* 1.51* 1.72* 1.52*  GFRNONAA 36* 52* 44* 51*    LIVER FUNCTION TESTS: Recent Labs    11/04/21 0906 11/18/21 0837 12/02/21 0916 12/16/21 0851  BILITOT 0.2* 0.5 0.4 0.6  AST '21 25 28 '$ 48*  ALT 24 28 35 29  ALKPHOS 89 89 76 88  PROT 7.3 7.4 6.8 7.4  ALBUMIN 4.2 4.3 4.1 4.0      Assessment and Plan:  61 y.o. male outpatient. 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 Found to have a liver lesion. PET scan from 7.13.23. The new lesion of concern in the left hepatic lobe is not optimally evaluated based  on small size, but does demonstrate hypermetabolic activity, suspicious for metastatic disease. Assuming the patient is unable to undergo MRI due to his pacemaker, recommend attention on follow-up CT in 3 months. MR Abdomen from 8,17.23 reads. Lesion of the posterior superior left lobe of the liver, hepatic segment II, measuring 1.9 x 1.8 cm corresponding to findings of prior imaging. Evaluation is somewhat limited breath motion artifact however there is subtle associated rim enhancement of this lesion. Findings are most in keeping with a hepatic metastasis in the setting of known recurrent melanoma. Team is requesting a liver biopsy for further evaluation.   Patient is on 81 mg of ASA.  Last dose on 9.18.23 All other labs and medications are within acceptable parameters. Patient has been NPO since midnight.   Risks and benefits of liver lesion biopsy was discussed with the patient and/or patient's family including, but not limited to bleeding, infection, damage to adjacent structures or low yield requiring additional tests.  All of the questions were answered and there is agreement to proceed.  Consent signed and in chart.   Thank you for this interesting consult.  I greatly enjoyed meeting Ziad Maye and look forward to participating in their care.  A copy of this report was sent to the requesting provider  on this date.  Electronically Signed: Jacqualine Mau, NP 12/19/2021, 9:01 AM   I spent a total of  30 Minutes   in face to face in clinical consultation, greater than 50% of which was counseling/coordinating care for liver biopsy

## 2021-12-19 NOTE — Procedures (Signed)
Interventional Radiology Procedure Note  Procedure: Ultrasound guided liver mass biopsy  Findings: Please refer to procedural dictation for full description. 18 ga core x3 from left lobe mass.  Gelfoam slurry needle track embolization.  Complications: None immediate  Estimated Blood Loss: < 5 mL  Recommendations: Strict 3 hour bedrest. Follow up Pathology results.   Ruthann Cancer, MD

## 2021-12-21 ENCOUNTER — Other Ambulatory Visit: Payer: Self-pay | Admitting: Oncology

## 2021-12-22 ENCOUNTER — Telehealth: Payer: Self-pay

## 2021-12-22 ENCOUNTER — Inpatient Hospital Stay: Payer: Medicare Other

## 2021-12-22 DIAGNOSIS — C4359 Malignant melanoma of other part of trunk: Secondary | ICD-10-CM | POA: Diagnosis not present

## 2021-12-22 DIAGNOSIS — C774 Secondary and unspecified malignant neoplasm of inguinal and lower limb lymph nodes: Secondary | ICD-10-CM | POA: Diagnosis not present

## 2021-12-22 DIAGNOSIS — R569 Unspecified convulsions: Secondary | ICD-10-CM | POA: Diagnosis not present

## 2021-12-22 DIAGNOSIS — I1310 Hypertensive heart and chronic kidney disease without heart failure, with stage 1 through stage 4 chronic kidney disease, or unspecified chronic kidney disease: Secondary | ICD-10-CM | POA: Diagnosis not present

## 2021-12-22 DIAGNOSIS — I2510 Atherosclerotic heart disease of native coronary artery without angina pectoris: Secondary | ICD-10-CM | POA: Diagnosis not present

## 2021-12-22 DIAGNOSIS — Z5112 Encounter for antineoplastic immunotherapy: Secondary | ICD-10-CM | POA: Diagnosis not present

## 2021-12-22 DIAGNOSIS — R932 Abnormal findings on diagnostic imaging of liver and biliary tract: Secondary | ICD-10-CM | POA: Diagnosis not present

## 2021-12-22 DIAGNOSIS — N1831 Chronic kidney disease, stage 3a: Secondary | ICD-10-CM | POA: Diagnosis not present

## 2021-12-22 DIAGNOSIS — C439 Malignant melanoma of skin, unspecified: Secondary | ICD-10-CM

## 2021-12-22 DIAGNOSIS — K760 Fatty (change of) liver, not elsewhere classified: Secondary | ICD-10-CM | POA: Diagnosis not present

## 2021-12-22 DIAGNOSIS — Z79899 Other long term (current) drug therapy: Secondary | ICD-10-CM | POA: Diagnosis not present

## 2021-12-22 DIAGNOSIS — I700 Atherosclerosis of aorta: Secondary | ICD-10-CM | POA: Diagnosis not present

## 2021-12-22 DIAGNOSIS — C438 Malignant melanoma of overlapping sites of skin: Secondary | ICD-10-CM

## 2021-12-22 DIAGNOSIS — K769 Liver disease, unspecified: Secondary | ICD-10-CM

## 2021-12-22 DIAGNOSIS — K5730 Diverticulosis of large intestine without perforation or abscess without bleeding: Secondary | ICD-10-CM | POA: Diagnosis not present

## 2021-12-22 DIAGNOSIS — I252 Old myocardial infarction: Secondary | ICD-10-CM | POA: Diagnosis not present

## 2021-12-22 DIAGNOSIS — E785 Hyperlipidemia, unspecified: Secondary | ICD-10-CM | POA: Diagnosis not present

## 2021-12-22 DIAGNOSIS — Z923 Personal history of irradiation: Secondary | ICD-10-CM | POA: Diagnosis not present

## 2021-12-22 DIAGNOSIS — R160 Hepatomegaly, not elsewhere classified: Secondary | ICD-10-CM | POA: Diagnosis not present

## 2021-12-22 DIAGNOSIS — D631 Anemia in chronic kidney disease: Secondary | ICD-10-CM | POA: Diagnosis not present

## 2021-12-22 DIAGNOSIS — E611 Iron deficiency: Secondary | ICD-10-CM | POA: Diagnosis not present

## 2021-12-22 DIAGNOSIS — K439 Ventral hernia without obstruction or gangrene: Secondary | ICD-10-CM | POA: Diagnosis not present

## 2021-12-22 DIAGNOSIS — Z7982 Long term (current) use of aspirin: Secondary | ICD-10-CM | POA: Diagnosis not present

## 2021-12-22 LAB — PSA: Prostatic Specific Antigen: 1.44 ng/mL (ref 0.00–4.00)

## 2021-12-22 NOTE — Telephone Encounter (Signed)
Patient informed and verbalized understanding. He will come today for labs and will keep MD appt on 10/6 as scheduled.   Please cancel lab and infusion on 10/6. KEEP MD. Pt aware and will come for MD only.

## 2021-12-22 NOTE — Telephone Encounter (Signed)
-----   Message from [name], MD sent at 12/21/2021 10:44 PM EDT ----- Please let him know that his biopsy showed cancer, I recommend him to do additional blood work.  Please arrange lab encounter  check CEA, CA 19.9, PSA, AFP. Thanks. Cancel keytruda treatment appt. Please arrange him to see me 1 week after lab is done thanks.

## 2021-12-23 DIAGNOSIS — R238 Other skin changes: Secondary | ICD-10-CM | POA: Diagnosis not present

## 2021-12-23 LAB — AFP TUMOR MARKER: AFP, Serum, Tumor Marker: 89.1 ng/mL — ABNORMAL HIGH (ref 0.0–8.4)

## 2021-12-23 LAB — CEA: CEA: 0.7 ng/mL (ref 0.0–4.7)

## 2021-12-23 LAB — CANCER ANTIGEN 19-9: CA 19-9: 4 U/mL (ref 0–35)

## 2021-12-26 LAB — SURGICAL PATHOLOGY

## 2021-12-29 ENCOUNTER — Inpatient Hospital Stay: Payer: Medicare Other | Attending: Oncology | Admitting: Oncology

## 2021-12-29 ENCOUNTER — Other Ambulatory Visit: Payer: Self-pay | Admitting: Oncology

## 2021-12-29 ENCOUNTER — Encounter: Payer: Self-pay | Admitting: Oncology

## 2021-12-29 DIAGNOSIS — C438 Malignant melanoma of overlapping sites of skin: Secondary | ICD-10-CM | POA: Diagnosis not present

## 2021-12-29 DIAGNOSIS — D015 Carcinoma in situ of liver, gallbladder and bile ducts: Secondary | ICD-10-CM | POA: Insufficient documentation

## 2021-12-29 DIAGNOSIS — N1832 Chronic kidney disease, stage 3b: Secondary | ICD-10-CM | POA: Diagnosis not present

## 2021-12-29 DIAGNOSIS — N1831 Chronic kidney disease, stage 3a: Secondary | ICD-10-CM | POA: Diagnosis not present

## 2021-12-29 DIAGNOSIS — C4359 Malignant melanoma of other part of trunk: Secondary | ICD-10-CM | POA: Insufficient documentation

## 2021-12-29 DIAGNOSIS — K769 Liver disease, unspecified: Secondary | ICD-10-CM | POA: Insufficient documentation

## 2021-12-29 DIAGNOSIS — D631 Anemia in chronic kidney disease: Secondary | ICD-10-CM | POA: Diagnosis not present

## 2021-12-29 NOTE — Assessment & Plan Note (Signed)
avoid nephrotoxins.   creatinine fluctuates. Encourage oral hydration.    

## 2021-12-29 NOTE — Assessment & Plan Note (Signed)
Iron panel showed iron deficiency.  Recommend ferrous sulfate '325mg'$  BID.  Continue monitor.

## 2021-12-29 NOTE — Assessment & Plan Note (Signed)
Patient has no image evidence of metastatic melanoma. Hold off additional immunotherapy.

## 2021-12-29 NOTE — Progress Notes (Addendum)
Hematology/Oncology Progress note [phone] [phone]      Patient Care Team: Mechele Claude, FNP as PCP - General (Family Medicine) [name], MD as Consulting Physician (Oncology) Mickeal Skinner, Acey Lav, MD as Consulting Physician (Oncology) [name], MD as Consulting Physician (Cardiology) Mechele Claude, FNP (Family Medicine)  ASSESSMENT & PLAN:   Cancer Staging  Malignant melanoma of overlapping sites [facility]) Staging form: Melanoma of the Skin, AJCC 8th Edition - Pathologic: Stage Unknown (rpTX, pN1b, cM0) - Signed by [name], MD on 07/27/2020 - Pathologic: No stage assigned - Unsigned   Malignant melanoma of overlapping sites [facility]) Patient has no image evidence of metastatic melanoma. Hold off additional immunotherapy.  Liver lesion Status post liver biopsy.  Pathology results showed carcinoma, does not favor metastatic melanoma or [place].  Morphology and immunohistochemistry pattern are nonspecific.  Differential diagnosis broad. Normal PSA, CEA, CA 19-9.  AFP is elevated. I will refer patient to establish care with [facility] oncology Dr. Mariah Milling [discussed case with him] for evaluation of surgical resection.   Anemia in chronic kidney disease Iron panel showed iron deficiency.  Recommend ferrous sulfate 361m BID.  Continue monitor.   Stage 3a chronic kidney disease (HCC) avoid nephrotoxins.   creatinine fluctuates. Encourage oral hydration.     No orders of the defined types were placed in this encounter.  Follow-up To be determined. All questions were answered. The patient knows to call the clinic with any problems, questions or concerns.  ZEarlie Server MD, PhD [facility] 12/29/2021   CHIEF COMPLAINTS/REASON FOR VISIT:  Follow up for melanoma HISTORY OF PRESENTING ILLNESS:   SDerald Lorgeis a  61y.o.  male presents for recurrent malignant melanoma.   Oncology History  Malignant melanoma of overlapping  sites ([facility]  04/16/2019 Cancer Staging   Staging form: Melanoma of the Skin, AJCC 8th Edition - Pathologic: Stage Unknown (rpTX, pN1b, cM0) - Signed by YEarlie Server MD on 07/27/2020 Stage prefix: Recurrence    04/23/2019 Initial Diagnosis   Malignant melanoma   -He has a history of left lower extremity melanoma in 2011, status post local excision -04/16/2019 patient underwent left groin mass resection  Resection pathology showed malignant melanoma, replacing a lymph node, with extracapsular extension, peripheral and deep margins involved.  Left inguinal contents, all 7 lymph nodes were negative for melanoma in the lymph nodes. Extranodal melanoma identified in lymphatic and interstitium between nodes -PDL1 80% TPS    07/07/2019 -  Radiation Therapy   status post adjuvant radiation.   07/23/2019 - 07/21/2021 Chemotherapy   Nivolumab q14d      06/29/2020 Imaging   CT chest abdomen pelvis showed stable postoperative appearance of the left groin.  No evidence of local recurrence.  No evidence of metastatic disease in the chest abdomen or pelvis.  Hepatic steatosis.  Stable subcentimeter fluid attenuation lesion of the lateral right lobe of the liver, likely benign cyst or hemangioma.  Coronary artery disease.  Aortic atherosclerosis   10/20/2020 Imaging   CT chest abdomen pelvis showed stable postoperative/radiation appearance of the left groin.  No evidence of local recurrence/metastatic disease within the chest abdomen/pelvis.  Fatty liver disease.  Diverticulosis without evidence of typhlitis.  Aortic atherosclerosis   03/10/2021 Imaging   MRI brain without contrast showed no definitive evidence of intracranial metastatic disease.  Study is limited by absence of intravenous contrast.     04/26/2021 Imaging   CT chest abdomen pelvis without contrast showed stable post  operative changes of left groin with no evidence of recurrent disease.  No evidence of metastatic disease in the chest abdomen pelvis.   Aortic atherosclerosis   08/08/2021 - 08/16/2021 Hospital Admission    patient was hospitalized due to NSTEMI status post CABG x3.  He also had pacemaker The echocardiogram showed left ventricular ejection fraction of 65 to 70%,   09/15/2021 Imaging   CT chest abdomen pelvis w contrast  IMPRESSION: 1. Subtle hypodense 9 mm lesion in the left lobe of the liver is new from prior imaging including previous contrasted CT dating back to December 10, 2019, with the lesion appearing to equilibrate with background liver on delayed imaging sequence but is incompletely evaluated on this imaging study and technically nonspecific possibly reflecting a benign perfusional variant and while its appearance is not typical for that of a melanoma metastasis, it is not excluded on this examination. Suggest more definitive characterization by hepatic protocol MRI with and without contrast. 2. Stable postoperative changes in the left groin without evidence of local recurrent disease.3. No evidence of metastatic disease in the chest or pelvis.4.  Aortic Atherosclerosis (ICD10-I70.0).      09/23/2021 Imaging   Contrast-enhanced liver ultrasound Mildly hypoenhancing 2.2 cm mass in the posterior aspect of the left lobe of the liver with washout characteristics concerning for non hepatocellular malignancy, concerning for melanotic metastasis given history. The lesion is in an unfavorable location for percutaneous biopsy. Consider PET-CT for further characterization   10/21/2021 -  Chemotherapy   Nivolumab q14d      11/10/2021 Imaging   MRI Brain w wo contrast Negative for metastatic disease to the brain   11/10/2021 Imaging   MRI abdomen w wo contrast 1. Lesion of the posterior superior left lobe of the liver, hepatic segment II, measuring 1.9 x 1.8 cm corresponding to findings of prior imaging. Evaluation is somewhat limited breath motion artifact however there is subtle associated rim enhancement of this lesion.  Findings are most in keeping with a hepatic metastasis in the setting of known recurrent melanoma. 2. Mild hepatic steatosis. 3. Cardiomegaly.     04/18/2021, patient establish care with neurology Dr. Mickeal Skinner for intermittent altered cognition.  he was recommended to start Vimpat.    INTERVAL HISTORY Edward Pitts is a 61 y.o. male who has above history reviewed by me today presents for follow up visit for management of inguinal nodal recurrence of melanoma, new liver lesion.  Status post liver lesion biopsy.  He was accompanied by his brother to discuss results.  No new complaints.  :Review of Systems  Constitutional:  Negative for appetite change, chills, fatigue, fever and unexpected weight change.  HENT:   Negative for hearing loss and voice change.   Eyes:  Negative for eye problems and icterus.  Respiratory:  Negative for chest tightness, cough and shortness of breath.   Cardiovascular:  Positive for leg swelling. Negative for chest pain.  Gastrointestinal:  Negative for abdominal distention and abdominal pain.  Endocrine: Negative for hot flashes.  Genitourinary:  Negative for difficulty urinating, dysuria and frequency.   Musculoskeletal:        Status post left hip replacement  Skin:  Negative for itching and rash.       Skin hypo-pigmentation on upper extremities, no change  Neurological:  Negative for light-headedness and numbness.       Spells  Hematological:  Negative for adenopathy. Does not bruise/bleed easily.  Psychiatric/Behavioral:  Negative for confusion.     MEDICAL HISTORY:  Past Medical History:  Diagnosis Date   Anemia    iron treatments   Anxiety    Aortic atherosclerosis (HCC)    Arthritis    Cancer of groin ([place]) 2021   left groin, resected, radiation   Cataract    Complication of anesthesia    PONV   Coronary artery disease    Dizziness of unknown etiology    has led to seizures and passing out.   Family history of adverse reaction to anesthesia     PONV mother   GERD (gastroesophageal reflux disease)    History of complete heart block    PPM placed   Hyperlipidemia    Hypertension    LBBB (left bundle branch block)    Lymphedema of left leg    uses thigh high compression stockings   Melanoma ([place]) 2012   skin cancer, left thigh   OSA on CPAP    PONV (postoperative nausea and vomiting) 04/16/2019   Port-A-Cath in place    RIGHT chest wall   Presence of cardiac pacemaker    [employer]   Seizures ([place])    still has episodes of dizziness. last event 1 month ago (march 2022) and will pass out. takes clonazepam    SURGICAL HISTORY: Past Surgical History:  Procedure Laterality Date   CORONARY ARTERY BYPASS GRAFT N/A 08/10/2021   Procedure: CORONARY ARTERY BYPASS GRAFTING (CABG) X 3 USING LEFT INTERNAL MAMMARY ARTERY AND RIGHT GREATER SAPHENOUS VEIN;  Surgeon: Dahlia Byes, MD;  Location: [place];  Service: Open Heart Surgery;  Laterality: N/A;   CT RADIATION THERAPY GUIDE     left groin   dental implant     permanent implant   ENDOVEIN HARVEST OF GREATER SAPHENOUS VEIN Right 08/10/2021   Procedure: ENDOVEIN HARVEST OF GREATER SAPHENOUS VEIN;  Surgeon: Dahlia Byes, MD;  Location: [place];  Service: Open Heart Surgery;  Laterality: Right;   KNEE SURGERY Left    arthroscopy   LEFT HEART CATH AND CORONARY ANGIOGRAPHY Left 06/29/2017   Procedure: LEFT HEART CATH AND CORONARY ANGIOGRAPHY;  Surgeon: [name], MD;  Location: [place] [facility];  Service: Cardiovascular;  Laterality: Left;   LEFT HEART CATH AND CORONARY ANGIOGRAPHY N/A 08/08/2021   Procedure: LEFT HEART CATH AND CORONARY ANGIOGRAPHY;  Surgeon: [name], MD;  Location: [place] [facility];  Service: Cardiovascular;  Laterality: N/A;   LYMPH NODE DISSECTION Left 04/16/2019   Procedure: Left inguinal Lymph Node Dissection;  Surgeon: [name], MD;  Location: [place];  Service: General;  Laterality: Left;   MELANOMA EXCISION Left 04/16/2019    Procedure: MELANOMA EXCISION LEFT GROIN MASS;  Surgeon: [name], MD;  Location: [place];  Service: General;  Laterality: Left;   MELANOMA EXCISION WITH SENTINEL LYMPH NODE BIOPSY Left 2012   Left calf    PACEMAKER INSERTION N/A 08/26/2018   Procedure: INSERTION PACEMAKER;  Surgeon: [name], MD;  Location: [facility];  Service: Cardiovascular;  Laterality: N/A;   PORTA CATH INSERTION N/A 08/26/2019   Procedure: PORTA CATH INSERTION;  Surgeon: Katha Cabal, MD;  Location: [place] [facility];  Service: Cardiovascular;  Laterality: N/A;   SUPERFICIAL LYMPH NODE BIOPSY / EXCISION Left 2020   lymph nodes removed around left groin melanoma site   TEE WITHOUT CARDIOVERSION N/A 08/10/2021   Procedure: TRANSESOPHAGEAL ECHOCARDIOGRAM (TEE);  Surgeon: Dahlia Byes, MD;  Location: [place];  Service: Open Heart Surgery;  Laterality: N/A;   TEMPORARY PACEMAKER N/A 08/25/2018  Procedure: TEMPORARY PACEMAKER;  Surgeon: [name], MD;  Location: [place] [facility];  Service: Cardiovascular;  Laterality: N/A;   TOTAL HIP ARTHROPLASTY Left 07/14/2020   Procedure: TOTAL HIP ARTHROPLASTY;  Surgeon: Dereck Leep, MD;  Location: [facility];  Service: Orthopedics;  Laterality: Left;    SOCIAL HISTORY: Social History   Socioeconomic History   Marital status: Married    Spouse name: Vicente Males    Number of children: 7   Years of education: 12   Highest education level: Not on file  Occupational History    Comment: disability  Tobacco Use   Smoking status: Never   Smokeless tobacco: Never  Vaping Use   Vaping Use: Never used  Substance and Sexual Activity   Alcohol use: No   Drug use: No   Sexual activity: Not Currently  Other Topics Concern   Not on file  Social History Narrative   Lives with  Wife,   Has 2 small dogs   Caffeine use: sodas (2 per day)      Out of work on disability.  Has a walk in shower. No stairs to climb   Oncology treatment ongoing. Uses port a cath for  treatment.      pacemaker   Social Determinants of Health   Financial Resource Strain: Low Risk  (02/12/2019)   Overall Financial Resource Strain (CARDIA)    Difficulty of Paying Living Expenses: Not hard at all  Food Insecurity: No Food Insecurity (02/12/2019)   Hunger Vital Sign    Worried About Running Out of Food in the Last Year: Never true    Ran Out of Food in the Last Year: Never true  Transportation Needs: Unmet Transportation Needs (02/12/2019)   PRAPARE - Hydrologist (Medical): Yes    Lack of Transportation (Non-Medical): Yes  Physical Activity: Unknown (02/12/2019)   Exercise Vital Sign    Days of Exercise per Week: 0 days    Minutes of Exercise per Session: Not on file  Stress: No Stress Concern Present (02/12/2019)   Carlos    Feeling of Stress : Only a little  Social Connections: Unknown (02/12/2019)   Social Connection and Isolation Panel [NHANES]    Frequency of Communication with Friends and Family: More than three times a week    Frequency of Social Gatherings with Friends and Family: Not on file    Attends Religious Services: Not on file    Active Member of Clubs or Organizations: Not on file    Attends Archivist Meetings: Not on file    Marital Status: Married  Intimate Partner Violence: Not At Risk (02/12/2019)   Humiliation, Afraid, Rape, and Kick questionnaire    Fear of Current or Ex-Partner: No    Emotionally Abused: No    Physically Abused: No    Sexually Abused: No    FAMILY HISTORY: Family History  Problem Relation Age of Onset   Cancer Paternal Grandmother     ALLERGIES:  is allergic to ibuprofen, levetiracetam, and nsaids.  MEDICATIONS:  Current Outpatient Medications  Medication Sig Dispense Refill   aspirin 81 MG EC tablet Take 81 mg by mouth daily.     atorvastatin (LIPITOR) 20 MG tablet Take 20 mg by mouth daily.      Carboxymeth-Glyc-Polysorb PF (REFRESH OPTIVE MEGA-3) 0.5-1-0.5 % SOLN Place 1 drop into both eyes daily as needed (dry eyes).     clonazePAM (KLONOPIN) 0.5  MG tablet TAKE 0.5MG IN AM, 1MG IN PM 90 tablet 0   febuxostat (ULORIC) 40 MG tablet Take 40 mg by mouth daily.     hydrochlorothiazide (HYDRODIURIL) 25 MG tablet      hydrocortisone 2.5 % ointment Apply 1 application. topically daily as needed (itching).     Lacosamide (VIMPAT) 100 MG TABS TAKE 1 TABLET (100 MG TOTAL) BY MOUTH IN THE MORNING AND AT BEDTIME. 60 tablet 3   lidocaine-prilocaine (EMLA) cream Apply 1 application. topically as needed. Apply small amount of cream to port site approx 1-2 hours prior to appointment. 30 g 11   lisinopril (ZESTRIL) 20 MG tablet Take 20 mg by mouth daily.     LORazepam (ATIVAN) 2 MG tablet TAKE 1 TABLET (2 MG TOTAL) BY MOUTH EVERY 8 (EIGHT) HOURS AS NEEDED FOR SEIZURE. 20 tablet 0   metoprolol succinate (TOPROL-XL) 25 MG 24 hr tablet Take 25 mg by mouth daily.     Multiple Vitamin (MULTIVITAMIN WITH MINERALS) TABS tablet Take 1 tablet by mouth daily. Centrum Silver     omeprazole (PRILOSEC) 20 MG capsule TAKE 1 CAPSULE BY MOUTH EVERY DAY 90 capsule 0   ondansetron (ZOFRAN) 4 MG tablet TAKE 1 TABLET BY MOUTH EVERY 8 HOURS AS NEEDED FOR NAUSEA AND VOMITING 90 tablet 1   ferrous sulfate 325 (65 FE) MG EC tablet TAKE 1 TABLET BY MOUTH 2 TIMES DAILY WITH A MEAL. (Patient not taking: Reported on 12/19/2021) 180 tablet 1   No current facility-administered medications for this visit.   Facility-Administered Medications Ordered in Other Visits  Medication Dose Route Frequency Provider Last Rate Last Admin   heparin lock flush 100 UNIT/ML injection            heparin lock flush 100 UNIT/ML injection              PHYSICAL EXAMINATION: ECOG PERFORMANCE STATUS: 1 - Symptomatic but completely ambulatory Vitals:   12/29/21 1144  BP: 118/65  Pulse: 83  Resp: 16  Temp: 97.7 F (36.5 C)  SpO2: 99%   Filed  Weights   12/29/21 1144  Weight: 244 lb 14.4 oz (111.1 kg)    Physical Exam Constitutional:      General: He is not in acute distress.    Comments: Patient ambulates independently  HENT:     Head: Normocephalic and atraumatic.  Eyes:     General: No scleral icterus.    Pupils: Pupils are equal, round, and reactive to light.  Cardiovascular:     Rate and Rhythm: Normal rate and regular rhythm.     Heart sounds: Normal heart sounds.  Pulmonary:     Effort: Pulmonary effort is normal. No respiratory distress.     Breath sounds: No wheezing.  Abdominal:     General: Bowel sounds are normal. There is no distension.     Palpations: Abdomen is soft. There is no mass.     Tenderness: There is no abdominal tenderness.     Comments:    Musculoskeletal:        General: No deformity. Normal range of motion.     Cervical back: Normal range of motion and neck supple.     Comments: Left lower extremity edema-chronic   Skin:    General: Skin is warm and dry.  Neurological:     Mental Status: He is alert and oriented to person, place, and time. Mental status is at baseline.     Cranial Nerves: No cranial nerve deficit.  Coordination: Coordination normal.  Psychiatric:        Mood and Affect: Mood normal.     LABORATORY DATA:  I have reviewed the data as listed    Latest Ref Rng & Units 12/19/2021   10:02 AM 12/16/2021    8:51 AM 12/02/2021    9:16 AM  CBC  WBC 4.0 - 10.5 K/uL 5.7  4.8  5.1   Hemoglobin 13.0 - 17.0 g/dL 11.6  11.1  11.2   Hematocrit 39.0 - 52.0 % 35.3  33.7  33.8   Platelets 150 - 400 K/uL 216  195  189       Latest Ref Rng & Units 12/16/2021    8:51 AM 12/02/2021    9:16 AM 11/18/2021    8:37 AM  CMP  Glucose 70 - 99 mg/dL 110  137  120   BUN 8 - 23 mg/dL 34  40  36   Creatinine 0.61 - 1.24 mg/dL 1.52  1.72  1.51   Sodium 135 - 145 mmol/L 139  139  137   Potassium 3.5 - 5.1 mmol/L 4.3  4.5  4.3   Chloride 98 - 111 mmol/L 114  113  109   CO2 22 - 32 mmol/L  _0 Calcium 8.9 - 10.3 mg/dL 9.1  8.9  8.9   Total Protein 6.5 - 8.1 g/dL 7.4  6.8  7.4   Total Bilirubin 0.3 - 1.2 mg/dL 0.6  0.4  0.5   Alkaline Phos 38 - 126 U/L 88  76  89   AST 15 - 41 U/L 48  28  25   ALT 0 - 44 U/L 29  35  28      RADIOGRAPHIC STUDIES: I have personally reviewed the radiological images as listed and agreed with the findings in the report. [place] CORE BIOPSY (LIVER)  Result Date: 12/19/2021 INDICATION: 61 year old male with history of melanoma presenting with indeterminate left lobe liver mass. EXAM: ULTRASOUND GUIDED LIVER LESION BIOPSY COMPARISON:  None Available. MEDICATIONS: None ANESTHESIA/SEDATION: Moderate (conscious) sedation was employed during this procedure. A total of Versed 2 mg and Fentanyl 100 mcg was administered intravenously. Moderate Sedation Time: 10 minutes. The patient's level of consciousness and vital signs were monitored continuously by radiology nursing throughout the procedure under my direct supervision. COMPLICATIONS: None immediate. PROCEDURE: Informed written consent was obtained from the patient after a discussion of the risks, benefits and alternatives to treatment. The patient understands and consents the procedure. A timeout was performed prior to the initiation of the procedure. Ultrasound scanning was performed of the subxiphoid region demonstrates heterogeneously hypoechoic mass in the posterior left lobe of the liver which measures approximately 2.0 X 2.9 cm. The left lobe liver mass was selected for biopsy and the procedure was planned. The right upper abdominal quadrant was prepped and draped in the usual sterile fashion. The overlying soft tissues were anesthetized with 1% lidocaine with epinephrine. A 17 gauge, 6.8 cm co-axial needle was advanced into a peripheral aspect of the lesion. This was followed by 3 core biopsies with an 18 gauge core device under direct ultrasound guidance. The coaxial needle tract was embolized with a  small amount of Gel-Foam slurry and superficial hemostasis was obtained with manual compression. Post procedural scanning was negative for definitive area of hemorrhage or additional complication. A dressing was placed. The patient tolerated the procedure well without immediate post procedural complication. IMPRESSION: Technically successful ultrasound guided core needle biopsy of  left lobe liver mass. Ruthann Cancer, MD [facility] Radiology Electronically Signed   By: Ruthann Cancer M.D.   On: 12/19/2021 13:07   MR Brain W Wo Contrast  Result Date: 11/12/2021 CLINICAL DATA:  Melanoma staging EXAM: MRI HEAD WITHOUT AND WITH CONTRAST TECHNIQUE: Multiplanar, multiecho pulse sequences of the brain and surrounding structures were obtained without and with intravenous contrast. CONTRAST:  48m GADAVIST GADOBUTROL 1 MMOL/ML IV SOLN COMPARISON:  10/06/2021 PET-CT FINDINGS: Brain: No swelling or enhancement to suggest metastatic disease. No central infarct, hemorrhage, hydrocephalus, or collection. Vascular: Normal flow voids and vascular enhancements. Skull and upper cervical spine: Negative for marrow lesion. Sinuses/Orbits: Negative. Other: The left parotid mass by PET CT is not well assessed on this brain MRI. IMPRESSION: Negative for metastatic disease to the brain. Electronically Signed   By: JJorje GuildM.D.   On: 11/12/2021 19:00   MR Abdomen W Wo Contrast  Result Date: 11/12/2021 CLINICAL DATA:  Malignant melanoma, left inguinal nodal recurrence, evaluate suspicious and FDG avid left lobe liver lesion EXAM: MRI ABDOMEN WITHOUT AND WITH CONTRAST TECHNIQUE: Multiplanar multisequence MR imaging of the abdomen was performed both before and after the administration of intravenous contrast. CONTRAST:  121mGADAVIST GADOBUTROL 1 MMOL/ML IV SOLN COMPARISON:  PET-CT, 10/06/2021, CT chest abdomen pelvis, 09/15/2021, contrast enhanced ultrasound, 09/23/2021 FINDINGS:  Lower chest: No acute abnormality.  Cardiomegaly. Hepatobiliary: Mild hepatic steatosis. Subtly T2 hyperintense lesion of the posterior superior left lobe of the liver, hepatic segment II, measuring 1.9 x 1.8 cm (series 5, image 15). Evaluation is somewhat limited in this vicinity, particularly on contrast enhanced sequences, however there is subtle associated rim enhancement of this lesion (e.g. Series 16, image 37). Simple, benign fluid signal cysts of the right lobe of the liver measuring up to 0.7 cm (series 5, image 21). No gallstones, gallbladder wall thickening, or biliary dilatation. Pancreas: Unremarkable. No pancreatic ductal dilatation or surrounding inflammatory changes. Spleen: Normal in size without significant abnormality. Adrenals/Urinary Tract: Adrenal glands are unremarkable. Simple, benign bilateral fluid signal renal cortical cysts for which no further follow-up or characterization is required. Kidneys are otherwise normal, without renal calculi, solid lesion, or hydronephrosis. Stomach/Bowel: Stomach is within normal limits. No evidence of bowel wall thickening, distention, or inflammatory changes. Vascular/Lymphatic: No significant vascular findings are present. No enlarged abdominal lymph nodes. Other: No abdominal wall hernia or abnormality. No ascites. Musculoskeletal: No acute or significant osseous findings. IMPRESSION: 1. Lesion of the posterior superior left lobe of the liver, hepatic segment II, measuring 1.9 x 1.8 cm corresponding to findings of prior imaging. Evaluation is somewhat limited breath motion artifact however there is subtle associated rim enhancement of this lesion. Findings are most in keeping with a hepatic metastasis in the setting of known recurrent melanoma. 2. Mild hepatic steatosis. 3. Cardiomegaly. Electronically Signed   By: AlDelanna Ahmadi.D.   On: 11/12/2021 13:29   NM PET Image Restage (PS) Whole Body  Result Date: 10/08/2021 CLINICAL DATA:  Subsequent  treatment strategy for metastatic melanoma with left inguinal nodal recurrence. New liver lesion. EXAM: NUCLEAR MEDICINE PET WHOLE BODY TECHNIQUE: 13.2 mCi F-18 FDG was injected intravenously. Full-ring PET imaging was performed from the head to foot after the radiotracer. CT data was obtained and used for attenuation correction and anatomic localization. Fasting blood glucose: 106 mg/dl COMPARISON:  PET-CT 02/11/2019. CT of the chest, abdomen and pelvis 09/15/2021. FINDINGS: Mediastinal blood pool activity: SUV max 3.2 HEAD/ NECK: There is a  small hypermetabolic nodule posteriorly in the superficial portion of the left parotid gland (SUV max 3.4). This appears similar to the previous PET-CT and is likely an incidental parotid lesion. No other hypermetabolic cervical lymph nodes are seen.There are no lesions of the pharyngeal mucosal space. Incidental CT findings: none CHEST: There are no hypermetabolic mediastinal, hilar or axillary lymph nodes. No hypermetabolic pulmonary activity or suspicious nodularity. Incidental CT findings: There is hypermetabolic activity within the median sternotomy, likely physiologic. The median sternotomy has incompletely healed, but demonstrates no dehiscence. Right IJ Port-A-Cath and left subclavian pacemaker leads are in place. ABDOMEN/PELVIS: There is focal hypermetabolic activity in the area of concern within the left hepatic lobe. The new lesion seen on CT is not well demonstrated on the CT images, but there is focal hypermetabolic activity in this area (SUV max 4.5). No other hypermetabolic liver lesions are seen. There is no hypermetabolic activity within the adrenal glands, pancreas or spleen. There is no hypermetabolic nodal activity. Previously demonstrated large left nodal mass has been surgically removed, and no residual hypermetabolic activity is present in this area. Incidental CT findings: Stable umbilical hernia containing only fat, colonic diverticulosis and aortic  atherosclerosis. SKELETON: There is no hypermetabolic activity to suggest osseous metastatic disease. Incidental CT findings: none EXTREMITIES: There is low-level hypermetabolic activity within the subcutaneous fat medial to the left knee, corresponding with surgical clips and attributed to previous saphenous venous harvesting for CABG. Correlate clinically. No other suspicious soft tissue activity within the extremities. Incidental CT findings: none IMPRESSION: 1. The new lesion of concern in the left hepatic lobe is not optimally evaluated based on small size, but does demonstrate hypermetabolic activity, suspicious for metastatic disease. Assuming the patient is unable to undergo MRI due to his pacemaker, recommend attention on follow-up CT in 3 months. 2. No other evidence of metastatic disease. 3. 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 Electronically Signed   By: Richardean Sale M.D.   On: 10/08/2021 15:01

## 2021-12-29 NOTE — Assessment & Plan Note (Addendum)
Status post liver biopsy.  Pathology results showed carcinoma, does not favor metastatic melanoma or [place].  Morphology and immunohistochemistry pattern are nonspecific.  Differential diagnosis broad. Normal PSA, CEA, CA 19-9.  AFP is elevated. I will refer patient to establish care with [facility] oncology Dr. Mariah Milling [discussed case with him] for evaluation of surgical resection.

## 2021-12-29 NOTE — Progress Notes (Signed)
Patient here for oncology follow-up appointment, concerns of SOB   °

## 2021-12-30 ENCOUNTER — Ambulatory Visit: Payer: Medicare Other

## 2021-12-30 ENCOUNTER — Inpatient Hospital Stay: Payer: Medicare Other

## 2021-12-30 ENCOUNTER — Inpatient Hospital Stay: Payer: Medicare Other | Admitting: Oncology

## 2021-12-30 ENCOUNTER — Other Ambulatory Visit: Payer: Medicare Other

## 2021-12-30 ENCOUNTER — Telehealth: Payer: Self-pay

## 2021-12-30 DIAGNOSIS — K769 Liver disease, unspecified: Secondary | ICD-10-CM

## 2021-12-30 DIAGNOSIS — C438 Malignant melanoma of overlapping sites of skin: Secondary | ICD-10-CM

## 2021-12-30 NOTE — Telephone Encounter (Signed)
Referral for pt to establish care with Dr. Mariah Milling has been faxed.   Re: surg eval for liver lesion  Ph: [phone] Fax: [phone]

## 2022-01-03 DIAGNOSIS — C220 Liver cell carcinoma: Secondary | ICD-10-CM | POA: Diagnosis not present

## 2022-01-05 ENCOUNTER — Inpatient Hospital Stay: Payer: 59

## 2022-01-09 DIAGNOSIS — Z01818 Encounter for other preprocedural examination: Secondary | ICD-10-CM | POA: Diagnosis not present

## 2022-01-09 DIAGNOSIS — R160 Hepatomegaly, not elsewhere classified: Secondary | ICD-10-CM | POA: Diagnosis not present

## 2022-01-09 DIAGNOSIS — C438 Malignant melanoma of overlapping sites of skin: Secondary | ICD-10-CM | POA: Diagnosis not present

## 2022-01-13 ENCOUNTER — Other Ambulatory Visit: Payer: Medicare Other

## 2022-01-13 ENCOUNTER — Ambulatory Visit: Payer: Medicare Other

## 2022-01-13 ENCOUNTER — Ambulatory Visit: Payer: Medicare Other | Admitting: Oncology

## 2022-01-16 DIAGNOSIS — I10 Essential (primary) hypertension: Secondary | ICD-10-CM | POA: Diagnosis not present

## 2022-01-16 DIAGNOSIS — I2510 Atherosclerotic heart disease of native coronary artery without angina pectoris: Secondary | ICD-10-CM | POA: Diagnosis not present

## 2022-01-16 DIAGNOSIS — Z79899 Other long term (current) drug therapy: Secondary | ICD-10-CM | POA: Diagnosis not present

## 2022-01-16 DIAGNOSIS — E782 Mixed hyperlipidemia: Secondary | ICD-10-CM | POA: Diagnosis not present

## 2022-01-16 DIAGNOSIS — M1A00X0 Idiopathic chronic gout, unspecified site, without tophus (tophi): Secondary | ICD-10-CM | POA: Diagnosis not present

## 2022-01-16 DIAGNOSIS — Z950 Presence of cardiac pacemaker: Secondary | ICD-10-CM | POA: Diagnosis not present

## 2022-01-16 DIAGNOSIS — I442 Atrioventricular block, complete: Secondary | ICD-10-CM | POA: Diagnosis not present

## 2022-01-16 DIAGNOSIS — I214 Non-ST elevation (NSTEMI) myocardial infarction: Secondary | ICD-10-CM | POA: Diagnosis not present

## 2022-01-16 DIAGNOSIS — Z951 Presence of aortocoronary bypass graft: Secondary | ICD-10-CM | POA: Diagnosis not present

## 2022-01-16 DIAGNOSIS — N1831 Chronic kidney disease, stage 3a: Secondary | ICD-10-CM | POA: Diagnosis not present

## 2022-01-16 DIAGNOSIS — I447 Left bundle-branch block, unspecified: Secondary | ICD-10-CM | POA: Diagnosis not present

## 2022-01-19 DIAGNOSIS — E782 Mixed hyperlipidemia: Secondary | ICD-10-CM | POA: Diagnosis not present

## 2022-01-19 DIAGNOSIS — N1831 Chronic kidney disease, stage 3a: Secondary | ICD-10-CM | POA: Diagnosis not present

## 2022-01-19 DIAGNOSIS — I129 Hypertensive chronic kidney disease with stage 1 through stage 4 chronic kidney disease, or unspecified chronic kidney disease: Secondary | ICD-10-CM | POA: Diagnosis not present

## 2022-01-19 DIAGNOSIS — C249 Malignant neoplasm of biliary tract, unspecified: Secondary | ICD-10-CM | POA: Diagnosis not present

## 2022-01-19 DIAGNOSIS — K760 Fatty (change of) liver, not elsewhere classified: Secondary | ICD-10-CM | POA: Diagnosis not present

## 2022-01-19 DIAGNOSIS — C221 Intrahepatic bile duct carcinoma: Secondary | ICD-10-CM | POA: Diagnosis not present

## 2022-01-19 DIAGNOSIS — C787 Secondary malignant neoplasm of liver and intrahepatic bile duct: Secondary | ICD-10-CM | POA: Diagnosis not present

## 2022-01-19 DIAGNOSIS — G8918 Other acute postprocedural pain: Secondary | ICD-10-CM | POA: Diagnosis not present

## 2022-01-19 DIAGNOSIS — D649 Anemia, unspecified: Secondary | ICD-10-CM | POA: Diagnosis not present

## 2022-01-19 DIAGNOSIS — Z888 Allergy status to other drugs, medicaments and biological substances status: Secondary | ICD-10-CM | POA: Diagnosis not present

## 2022-01-19 DIAGNOSIS — K7400 Hepatic fibrosis, unspecified: Secondary | ICD-10-CM | POA: Diagnosis not present

## 2022-01-19 DIAGNOSIS — C229 Malignant neoplasm of liver, not specified as primary or secondary: Secondary | ICD-10-CM | POA: Diagnosis not present

## 2022-01-19 DIAGNOSIS — Z96642 Presence of left artificial hip joint: Secondary | ICD-10-CM | POA: Diagnosis not present

## 2022-01-19 DIAGNOSIS — R160 Hepatomegaly, not elsewhere classified: Secondary | ICD-10-CM | POA: Diagnosis not present

## 2022-01-19 DIAGNOSIS — Z951 Presence of aortocoronary bypass graft: Secondary | ICD-10-CM | POA: Diagnosis not present

## 2022-01-19 DIAGNOSIS — Z950 Presence of cardiac pacemaker: Secondary | ICD-10-CM | POA: Diagnosis not present

## 2022-01-19 DIAGNOSIS — I2510 Atherosclerotic heart disease of native coronary artery without angina pectoris: Secondary | ICD-10-CM | POA: Diagnosis not present

## 2022-01-19 DIAGNOSIS — Z85820 Personal history of malignant melanoma of skin: Secondary | ICD-10-CM | POA: Diagnosis not present

## 2022-01-19 DIAGNOSIS — E861 Hypovolemia: Secondary | ICD-10-CM | POA: Diagnosis not present

## 2022-01-19 DIAGNOSIS — C439 Malignant melanoma of skin, unspecified: Secondary | ICD-10-CM | POA: Diagnosis not present

## 2022-01-19 DIAGNOSIS — C228 Malignant neoplasm of liver, primary, unspecified as to type: Secondary | ICD-10-CM | POA: Diagnosis not present

## 2022-01-19 DIAGNOSIS — I272 Pulmonary hypertension, unspecified: Secondary | ICD-10-CM | POA: Diagnosis not present

## 2022-01-20 DIAGNOSIS — R160 Hepatomegaly, not elsewhere classified: Secondary | ICD-10-CM | POA: Diagnosis not present

## 2022-01-21 ENCOUNTER — Other Ambulatory Visit: Payer: Self-pay | Admitting: Oncology

## 2022-01-23 ENCOUNTER — Encounter (INDEPENDENT_AMBULATORY_CARE_PROVIDER_SITE_OTHER): Payer: Self-pay

## 2022-01-23 ENCOUNTER — Encounter: Payer: Self-pay | Admitting: Oncology

## 2022-01-25 ENCOUNTER — Other Ambulatory Visit: Payer: Self-pay | Admitting: *Deleted

## 2022-01-25 NOTE — Telephone Encounter (Signed)
Received a request from [facility] for zofran RF (last filled by Merrily Pew, NP on July 17,2023 . Patient does not have any f/u with Dr. Tasia Catchings. Only has port flushes and apts with Dr. Mickeal Skinner.  Dr. Tasia Catchings - Please advise on RF and any f/us

## 2022-01-26 MED ORDER — ONDANSETRON HCL 4 MG PO TABS: ORAL_TABLET | ORAL | 1 refills | Status: AC

## 2022-01-27 ENCOUNTER — Other Ambulatory Visit: Payer: Self-pay | Admitting: Internal Medicine

## 2022-01-31 DIAGNOSIS — R160 Hepatomegaly, not elsewhere classified: Secondary | ICD-10-CM | POA: Diagnosis not present

## 2022-01-31 DIAGNOSIS — C779 Secondary and unspecified malignant neoplasm of lymph node, unspecified: Secondary | ICD-10-CM | POA: Diagnosis not present

## 2022-01-31 DIAGNOSIS — C438 Malignant melanoma of overlapping sites of skin: Secondary | ICD-10-CM | POA: Diagnosis not present

## 2022-02-03 ENCOUNTER — Telehealth: Payer: Self-pay

## 2022-02-03 NOTE — Telephone Encounter (Signed)
-----   Message from [name], MD sent at 02/03/2022  2:26 PM EST ----- He has had resection done at [facility].  Please schedule him to do a follow up during week of 11/20, MD only. Thanks.

## 2022-02-07 DIAGNOSIS — I129 Hypertensive chronic kidney disease with stage 1 through stage 4 chronic kidney disease, or unspecified chronic kidney disease: Secondary | ICD-10-CM | POA: Diagnosis not present

## 2022-02-07 DIAGNOSIS — I10 Essential (primary) hypertension: Secondary | ICD-10-CM | POA: Diagnosis not present

## 2022-02-07 DIAGNOSIS — Z951 Presence of aortocoronary bypass graft: Secondary | ICD-10-CM | POA: Diagnosis not present

## 2022-02-07 DIAGNOSIS — I214 Non-ST elevation (NSTEMI) myocardial infarction: Secondary | ICD-10-CM | POA: Diagnosis not present

## 2022-02-07 DIAGNOSIS — Z950 Presence of cardiac pacemaker: Secondary | ICD-10-CM | POA: Diagnosis not present

## 2022-02-07 DIAGNOSIS — R001 Bradycardia, unspecified: Secondary | ICD-10-CM | POA: Diagnosis not present

## 2022-02-07 DIAGNOSIS — Z952 Presence of prosthetic heart valve: Secondary | ICD-10-CM | POA: Diagnosis not present

## 2022-02-07 DIAGNOSIS — I447 Left bundle-branch block, unspecified: Secondary | ICD-10-CM | POA: Diagnosis not present

## 2022-02-07 DIAGNOSIS — I442 Atrioventricular block, complete: Secondary | ICD-10-CM | POA: Diagnosis not present

## 2022-02-07 DIAGNOSIS — I2510 Atherosclerotic heart disease of native coronary artery without angina pectoris: Secondary | ICD-10-CM | POA: Diagnosis not present

## 2022-02-14 DIAGNOSIS — Z9049 Acquired absence of other specified parts of digestive tract: Secondary | ICD-10-CM | POA: Diagnosis not present

## 2022-02-14 DIAGNOSIS — C221 Intrahepatic bile duct carcinoma: Secondary | ICD-10-CM | POA: Diagnosis not present

## 2022-02-14 DIAGNOSIS — E875 Hyperkalemia: Secondary | ICD-10-CM | POA: Diagnosis not present

## 2022-02-14 DIAGNOSIS — Z483 Aftercare following surgery for neoplasm: Secondary | ICD-10-CM | POA: Diagnosis not present

## 2022-02-15 ENCOUNTER — Encounter: Payer: Self-pay | Admitting: Cardiothoracic Surgery

## 2022-02-15 ENCOUNTER — Other Ambulatory Visit: Payer: Self-pay

## 2022-02-15 ENCOUNTER — Telehealth: Payer: Self-pay | Admitting: *Deleted

## 2022-02-15 ENCOUNTER — Ambulatory Visit: Payer: Medicare Other | Admitting: Oncology

## 2022-02-15 DIAGNOSIS — C438 Malignant melanoma of overlapping sites of skin: Secondary | ICD-10-CM

## 2022-02-15 NOTE — Telephone Encounter (Signed)
Lab and orders have been added.

## 2022-02-15 NOTE — Telephone Encounter (Signed)
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 She is alos requesting that when we see him on 11/28 that we recheck his labs as his K+ and kidney functions are off. Please add a lab encounter and order a CMP per her request.  omprehensive Metabolic Panel (CMP) Specimen: Blood  Ref Range & Units 1 d ago Comments  Sodium 135 - 145 mmol/L 141   Potassium 3.5 - 5.0 mmol/L 5.9 High    Chloride 98 - 108 mmol/L 118 High    Carbon Dioxide (CO2) 21 - 30 mmol/L 20 Low    Urea Nitrogen (BUN) 7 - 20 mg/dL 46 High    Creatinine 0.6 - 1.3 mg/dL 1.9 High    Glucose 70 - 140 mg/dL 110 Interpretive Data: Above is the NONFASTING reference range.  Below are the FASTING reference ranges: NORMAL:      70-99 mg/dL PREDIABETES: 100-125 mg/dL DIABETES:    > 125 mg/dL  Calcium 8.7 - 10.2 mg/dL 9.2   AST (Aspartate Aminotransferase) 15 - 41 U/L 27   ALT (Alanine Aminotransferase) 15 - 50 U/L 41   Bilirubin, Total 0.4 - 1.5 mg/dL 0.5   Alk Phos (Alkaline Phosphatase) 24 - 110 U/L 73   Albumin 3.5 - 4.8 g/dL 3.8   Protein, Total 6.2 - 8.1 g/dL 7.3   Anion Gap 3 - 12 mmol/L 3   BUN/CREA Ratio 6 - 27 24   Glomerular Filtration Rate (eGFR) mL/min/1.73sq m 39 CKD-EPI (2021) does not include patient's race in the calculation of eGFR. Monitoring changes of plasma creatinine and eGFR over time is useful for monitoring kidney function.  This change was made on 05/25/2020.  Interpretive Ranges for eGFR(CKD-EPI 2021):  eGFR:              > 60 mL/min/1.73 sq m - Normal eGFR:              30 - 59 mL/min/1.73 sq m - Moderately Decreased eGFR:              15 - 29 mL/min/1.73 sq m - Severely Decreased eGFR:              < 15 mL/min/1.73 sq m -  Kidney Failure   Note: These eGFR calculations do not apply in acute situations when eGFR is changing rapidly or in patients on dialysis.  Resulting Agency  [facility]   Specimen Collected: 02/14/22 09:16   Performed by: Sharilyn Sites [facility] Last Resulted: 02/14/22 10:35

## 2022-02-21 ENCOUNTER — Other Ambulatory Visit: Payer: Self-pay

## 2022-02-21 ENCOUNTER — Encounter: Payer: Self-pay | Admitting: Emergency Medicine

## 2022-02-21 ENCOUNTER — Inpatient Hospital Stay: Payer: Medicare Other | Attending: Oncology | Admitting: Oncology

## 2022-02-21 ENCOUNTER — Encounter: Payer: Self-pay | Admitting: Oncology

## 2022-02-21 ENCOUNTER — Observation Stay
Admission: EM | Admit: 2022-02-21 | Discharge: 2022-02-22 | Disposition: A | Discharge status: Home / Self Care | Payer: Medicare Other | Attending: Internal Medicine | Admitting: Internal Medicine

## 2022-02-21 ENCOUNTER — Emergency Department: Payer: Medicare Other

## 2022-02-21 ENCOUNTER — Inpatient Hospital Stay: Payer: Medicare Other

## 2022-02-21 VITALS — BP 125/66 | HR 70 | Temp 96.7°F | Resp 18 | Wt 241.4 lb

## 2022-02-21 DIAGNOSIS — C221 Intrahepatic bile duct carcinoma: Secondary | ICD-10-CM

## 2022-02-21 DIAGNOSIS — C438 Malignant melanoma of overlapping sites of skin: Secondary | ICD-10-CM

## 2022-02-21 DIAGNOSIS — D631 Anemia in chronic kidney disease: Secondary | ICD-10-CM | POA: Diagnosis not present

## 2022-02-21 DIAGNOSIS — Z79899 Other long term (current) drug therapy: Secondary | ICD-10-CM | POA: Insufficient documentation

## 2022-02-21 DIAGNOSIS — N179 Acute kidney failure, unspecified: Secondary | ICD-10-CM | POA: Diagnosis not present

## 2022-02-21 DIAGNOSIS — Z96642 Presence of left artificial hip joint: Secondary | ICD-10-CM | POA: Insufficient documentation

## 2022-02-21 DIAGNOSIS — I442 Atrioventricular block, complete: Secondary | ICD-10-CM | POA: Diagnosis present

## 2022-02-21 DIAGNOSIS — C4359 Malignant melanoma of other part of trunk: Secondary | ICD-10-CM | POA: Insufficient documentation

## 2022-02-21 DIAGNOSIS — N1832 Chronic kidney disease, stage 3b: Secondary | ICD-10-CM | POA: Diagnosis not present

## 2022-02-21 DIAGNOSIS — I129 Hypertensive chronic kidney disease with stage 1 through stage 4 chronic kidney disease, or unspecified chronic kidney disease: Secondary | ICD-10-CM | POA: Insufficient documentation

## 2022-02-21 DIAGNOSIS — N1831 Chronic kidney disease, stage 3a: Secondary | ICD-10-CM | POA: Insufficient documentation

## 2022-02-21 DIAGNOSIS — Z7901 Long term (current) use of anticoagulants: Secondary | ICD-10-CM | POA: Insufficient documentation

## 2022-02-21 DIAGNOSIS — E66813 Obesity, class 3: Secondary | ICD-10-CM | POA: Diagnosis present

## 2022-02-21 DIAGNOSIS — Z951 Presence of aortocoronary bypass graft: Secondary | ICD-10-CM | POA: Diagnosis not present

## 2022-02-21 DIAGNOSIS — Z85820 Personal history of malignant melanoma of skin: Secondary | ICD-10-CM | POA: Diagnosis not present

## 2022-02-21 DIAGNOSIS — E875 Hyperkalemia: Principal | ICD-10-CM | POA: Diagnosis present

## 2022-02-21 DIAGNOSIS — R0602 Shortness of breath: Secondary | ICD-10-CM | POA: Insufficient documentation

## 2022-02-21 DIAGNOSIS — I2510 Atherosclerotic heart disease of native coronary artery without angina pectoris: Secondary | ICD-10-CM | POA: Diagnosis present

## 2022-02-21 DIAGNOSIS — R569 Unspecified convulsions: Secondary | ICD-10-CM

## 2022-02-21 DIAGNOSIS — G40909 Epilepsy, unspecified, not intractable, without status epilepticus: Secondary | ICD-10-CM

## 2022-02-21 DIAGNOSIS — I10 Essential (primary) hypertension: Secondary | ICD-10-CM | POA: Diagnosis present

## 2022-02-21 DIAGNOSIS — N189 Chronic kidney disease, unspecified: Secondary | ICD-10-CM | POA: Insufficient documentation

## 2022-02-21 DIAGNOSIS — Z7982 Long term (current) use of aspirin: Secondary | ICD-10-CM | POA: Insufficient documentation

## 2022-02-21 LAB — BASIC METABOLIC PANEL
Anion gap: 3 — ABNORMAL LOW (ref 5–15)
Anion gap: 4 — ABNORMAL LOW (ref 5–15)
BUN: 47 mg/dL — ABNORMAL HIGH (ref 8–23)
BUN: 41 mg/dL — ABNORMAL HIGH (ref 8–23)
CO2: 19 mmol/L — ABNORMAL LOW (ref 22–32)
CO2: 20 mmol/L — ABNORMAL LOW (ref 22–32)
Calcium: 9.2 mg/dL (ref 8.9–10.3)
Calcium: 9.1 mg/dL (ref 8.9–10.3)
Chloride: 115 mmol/L — ABNORMAL HIGH (ref 98–111)
Chloride: 115 mmol/L — ABNORMAL HIGH (ref 98–111)
Creatinine, Ser: 1.86 mg/dL — ABNORMAL HIGH (ref 0.61–1.24)
Creatinine, Ser: 1.43 mg/dL — ABNORMAL HIGH (ref 0.61–1.24)
GFR, Estimated: 40 mL/min — ABNORMAL LOW (ref >60)
GFR, Estimated: 55 mL/min — ABNORMAL LOW (ref >60)
Glucose, Bld: 100 mg/dL — ABNORMAL HIGH (ref 70–99)
Glucose, Bld: 94 mg/dL (ref 70–99)
Potassium: 6.2 mmol/L — ABNORMAL HIGH (ref 3.5–5.1)
Potassium: 5.6 mmol/L — ABNORMAL HIGH (ref 3.5–5.1)
Sodium: 137 mmol/L (ref 135–145)
Sodium: 139 mmol/L (ref 135–145)

## 2022-02-21 LAB — CBC WITH DIFFERENTIAL/PLATELET
Abs Immature Granulocytes: 0.05 10*3/uL (ref 0.00–0.07)
Abs Immature Granulocytes: 0.06 10*3/uL (ref 0.00–0.07)
Basophils Absolute: 0 10*3/uL (ref 0.0–0.1)
Basophils Absolute: 0 10*3/uL (ref 0.0–0.1)
Basophils Relative: 1 %
Basophils Relative: 1 %
Eosinophils Absolute: 0.2 10*3/uL (ref 0.0–0.5)
Eosinophils Absolute: 0.2 10*3/uL (ref 0.0–0.5)
Eosinophils Relative: 4 %
Eosinophils Relative: 4 %
HCT: 35.1 % — ABNORMAL LOW (ref 39.0–52.0)
HCT: 35.2 % — ABNORMAL LOW (ref 39.0–52.0)
Hemoglobin: 11.2 g/dL — ABNORMAL LOW (ref 13.0–17.0)
Hemoglobin: 11.4 g/dL — ABNORMAL LOW (ref 13.0–17.0)
Immature Granulocytes: 1 %
Immature Granulocytes: 1 %
Lymphocytes Relative: 22 %
Lymphocytes Relative: 25 %
Lymphs Abs: 1.1 10*3/uL (ref 0.7–4.0)
Lymphs Abs: 1.2 10*3/uL (ref 0.7–4.0)
MCH: 29.6 pg (ref 26.0–34.0)
MCH: 29.5 pg (ref 26.0–34.0)
MCHC: 31.9 g/dL (ref 30.0–36.0)
MCHC: 32.4 g/dL (ref 30.0–36.0)
MCV: 92.6 fL (ref 80.0–100.0)
MCV: 91 fL (ref 80.0–100.0)
Monocytes Absolute: 0.5 10*3/uL (ref 0.1–1.0)
Monocytes Absolute: 0.5 10*3/uL (ref 0.1–1.0)
Monocytes Relative: 11 %
Monocytes Relative: 10 %
Neutro Abs: 3.1 10*3/uL (ref 1.7–7.7)
Neutro Abs: 2.9 10*3/uL (ref 1.7–7.7)
Neutrophils Relative %: 61 %
Neutrophils Relative %: 59 %
Platelets: 168 10*3/uL (ref 150–400)
Platelets: 175 10*3/uL (ref 150–400)
RBC: 3.79 MIL/uL — ABNORMAL LOW (ref 4.22–5.81)
RBC: 3.87 MIL/uL — ABNORMAL LOW (ref 4.22–5.81)
RDW: 13.5 % (ref 11.5–15.5)
RDW: 13.6 % (ref 11.5–15.5)
WBC: 4.9 10*3/uL (ref 4.0–10.5)
WBC: 4.8 10*3/uL (ref 4.0–10.5)
nRBC: 0 % (ref 0.0–0.2)
nRBC: 0 % (ref 0.0–0.2)

## 2022-02-21 LAB — COMPREHENSIVE METABOLIC PANEL
ALT: 44 U/L (ref 0–44)
AST: 27 U/L (ref 15–41)
Albumin: 4.2 g/dL (ref 3.5–5.0)
Alkaline Phosphatase: 74 U/L (ref 38–126)
Anion gap: 5 (ref 5–15)
BUN: 47 mg/dL — ABNORMAL HIGH (ref 8–23)
CO2: 18 mmol/L — ABNORMAL LOW (ref 22–32)
Calcium: 9.1 mg/dL (ref 8.9–10.3)
Chloride: 113 mmol/L — ABNORMAL HIGH (ref 98–111)
Creatinine, Ser: 1.82 mg/dL — ABNORMAL HIGH (ref 0.61–1.24)
GFR, Estimated: 41 mL/min — ABNORMAL LOW (ref >60)
Glucose, Bld: 109 mg/dL — ABNORMAL HIGH (ref 70–99)
Potassium: 6.1 mmol/L — ABNORMAL HIGH (ref 3.5–5.1)
Sodium: 136 mmol/L (ref 135–145)
Total Bilirubin: 0.4 mg/dL (ref 0.3–1.2)
Total Protein: 7.6 g/dL (ref 6.5–8.1)

## 2022-02-21 LAB — CBG MONITORING, ED
Glucose-Capillary: 91 mg/dL (ref 70–99)
Glucose-Capillary: 99 mg/dL (ref 70–99)

## 2022-02-21 LAB — TROPONIN I (HIGH SENSITIVITY): Troponin I (High Sensitivity): 5 ng/L (ref <18)

## 2022-02-21 LAB — GLUCOSE, RANDOM: Glucose, Bld: 95 mg/dL (ref 70–99)

## 2022-02-21 LAB — BRAIN NATRIURETIC PEPTIDE: B Natriuretic Peptide: 29.1 pg/mL (ref 0.0–100.0)

## 2022-02-21 MED ORDER — CLONAZEPAM 0.5 MG PO TABS
1.0000 mg | ORAL_TABLET | Freq: Every day | ORAL | Status: DC
  Administered 2022-02-21: 1 mg via ORAL
  Filled 2022-02-21: qty 2

## 2022-02-21 MED ORDER — LACOSAMIDE 50 MG PO TABS
100.0000 mg | ORAL_TABLET | Freq: Two times a day (BID) | ORAL | Status: DC
  Administered 2022-02-21 – 2022-02-22 (×2): 100 mg via ORAL
  Filled 2022-02-21 (×2): qty 2

## 2022-02-21 MED ORDER — PANTOPRAZOLE SODIUM 40 MG PO TBEC
40.0000 mg | DELAYED_RELEASE_TABLET | Freq: Every day | ORAL | Status: DC
  Administered 2022-02-22: 40 mg via ORAL
  Filled 2022-02-21: qty 1

## 2022-02-21 MED ORDER — FERROUS SULFATE 325 (65 FE) MG PO TABS
325.0000 mg | ORAL_TABLET | Freq: Two times a day (BID) | ORAL | Status: DC
  Administered 2022-02-21 – 2022-02-22 (×2): 325 mg via ORAL
  Filled 2022-02-21 (×2): qty 1

## 2022-02-21 MED ORDER — METOPROLOL SUCCINATE ER 25 MG PO TB24
25.0000 mg | ORAL_TABLET | Freq: Every day | ORAL | Status: DC
  Administered 2022-02-22: 25 mg via ORAL
  Filled 2022-02-21: qty 1

## 2022-02-21 MED ORDER — LIDOCAINE-PRILOCAINE 2.5-2.5 % EX CREA: 1.0000 | TOPICAL_CREAM | CUTANEOUS | Status: DC | PRN

## 2022-02-21 MED ORDER — ATORVASTATIN CALCIUM 20 MG PO TABS
20.0000 mg | ORAL_TABLET | Freq: Every day | ORAL | Status: DC
  Administered 2022-02-22: 20 mg via ORAL
  Filled 2022-02-21: qty 1

## 2022-02-21 MED ORDER — ACETAMINOPHEN 325 MG PO TABS: 650.0000 mg | ORAL_TABLET | Freq: Four times a day (QID) | ORAL | Status: DC | PRN

## 2022-02-21 MED ORDER — ADULT MULTIVITAMIN W/MINERALS CH
1.0000 | ORAL_TABLET | Freq: Every day | ORAL | Status: DC
  Administered 2022-02-22: 1 via ORAL
  Filled 2022-02-21: qty 1

## 2022-02-21 MED ORDER — CLONAZEPAM 0.5 MG PO TABS: 0.5000 mg | ORAL_TABLET | Freq: Two times a day (BID) | ORAL | Status: DC

## 2022-02-21 MED ORDER — LORAZEPAM 2 MG PO TABS: 2.0000 mg | ORAL_TABLET | Freq: Three times a day (TID) | ORAL | Status: DC | PRN

## 2022-02-21 MED ORDER — ACETAMINOPHEN 650 MG RE SUPP: 650.0000 mg | Freq: Four times a day (QID) | RECTAL | Status: DC | PRN

## 2022-02-21 MED ORDER — DEXTROSE 50 % IV SOLN
1.0000 | Freq: Once | INTRAVENOUS | Status: AC
  Administered 2022-02-21: 50 mL via INTRAVENOUS
  Filled 2022-02-21: qty 50

## 2022-02-21 MED ORDER — CLONAZEPAM 0.5 MG PO TABS
0.5000 mg | ORAL_TABLET | Freq: Every morning | ORAL | Status: DC
  Administered 2022-02-22: 0.5 mg via ORAL
  Filled 2022-02-21: qty 1

## 2022-02-21 MED ORDER — SODIUM CHLORIDE 0.9 % IV BOLUS
500.0000 mL | Freq: Once | INTRAVENOUS | Status: AC
  Administered 2022-02-21: 500 mL via INTRAVENOUS

## 2022-02-21 MED ORDER — INSULIN ASPART 100 UNIT/ML IV SOLN
5.0000 [IU] | Freq: Once | INTRAVENOUS | Status: AC
  Administered 2022-02-21: 5 [IU] via INTRAVENOUS
  Filled 2022-02-21: qty 0.05

## 2022-02-21 MED ORDER — ONDANSETRON HCL 4 MG PO TABS: 4.0000 mg | ORAL_TABLET | Freq: Four times a day (QID) | ORAL | Status: DC | PRN

## 2022-02-21 MED ORDER — STERILE WATER FOR INJECTION IV SOLN
INTRAVENOUS | Status: AC
  Filled 2022-02-21: qty 150
  Filled 2022-02-21 (×2): qty 1000

## 2022-02-21 MED ORDER — APIXABAN 5 MG PO TABS
5.0000 mg | ORAL_TABLET | Freq: Two times a day (BID) | ORAL | Status: DC
  Administered 2022-02-21 – 2022-02-22 (×2): 5 mg via ORAL
  Filled 2022-02-21 (×2): qty 1

## 2022-02-21 MED ORDER — POLYVINYL ALCOHOL 1.4 % OP SOLN: 1.0000 [drp] | Freq: Every day | OPHTHALMIC | Status: DC | PRN

## 2022-02-21 MED ORDER — ONDANSETRON HCL 4 MG/2ML IJ SOLN: 4.0000 mg | Freq: Four times a day (QID) | INTRAMUSCULAR | Status: DC | PRN

## 2022-02-21 MED ORDER — CALCIUM GLUCONATE-NACL 1-0.675 GM/50ML-% IV SOLN
1.0000 g | Freq: Once | INTRAVENOUS | Status: AC
  Administered 2022-02-21: 1000 mg via INTRAVENOUS
  Filled 2022-02-21: qty 50

## 2022-02-21 MED ORDER — FEBUXOSTAT 40 MG PO TABS
40.0000 mg | ORAL_TABLET | Freq: Every day | ORAL | Status: DC
  Administered 2022-02-22: 40 mg via ORAL
  Filled 2022-02-21: qty 1

## 2022-02-21 MED ORDER — SODIUM ZIRCONIUM CYCLOSILICATE 10 G PO PACK
10.0000 g | PACK | Freq: Once | ORAL | Status: AC
  Administered 2022-02-21: 10 g via ORAL
  Filled 2022-02-21: qty 1

## 2022-02-21 MED ORDER — ASPIRIN 81 MG PO TBEC
81.0000 mg | DELAYED_RELEASE_TABLET | Freq: Every day | ORAL | Status: DC
  Administered 2022-02-22: 81 mg via ORAL
  Filled 2022-02-21: qty 1

## 2022-02-21 NOTE — Assessment & Plan Note (Signed)
Continue lacosamide Seizure precautions

## 2022-02-21 NOTE — Assessment & Plan Note (Signed)
Status post partial hepatectomy Follow-up with oncology as an outpatient

## 2022-02-21 NOTE — Assessment & Plan Note (Signed)
Secondary to ACE inhibitor We will discontinue lisinopril And also received dextrose, insulin and Lokelma Will administer 1 L of bicarbonate infusion Repeat potassium levels

## 2022-02-21 NOTE — ED Triage Notes (Signed)
Patient sent to ED by Cancer MD for abnormal labs. Patient's potassium was noted to be 6.1 with mild SOB per patient. SOB worse with exertion. Has pacemaker.

## 2022-02-21 NOTE — Assessment & Plan Note (Signed)
Continue metoprolol Hold lisinopril and hydrochlorothiazide due to AKI

## 2022-02-21 NOTE — Assessment & Plan Note (Signed)
Status post permanent pacemaker placement

## 2022-02-21 NOTE — Progress Notes (Signed)
Pt here for follow-up. He reports that he is not having diarrhea but is having to have frequent bowel movements.  Pt reports occasional increased shortness of breath.

## 2022-02-21 NOTE — H&P (Signed)
History and Physical    Patient: Edward Pitts JGG:[id number] DOB: 25-Mar-1961 DOA: 02/21/2022 DOS: the patient was seen and examined on 02/21/2022 PCP: Mechele Claude, FNP  Patient coming from: Home  Chief Complaint:  Chief Complaint  Patient presents with   Abnormal Labs   HPI: Edward Pitts is a 61 y.o. 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 Patient said he had gone for his follow-up appointment at the cancer center and had labs drawn and was told to go to the ER because his potassium was elevated.  It was 6.1. Patient continues to void.  He denies having any abdominal pain, no fever, no chills, no cough, no headache, no chest pain, no diarrhea, no nausea, no vomiting, no dizziness, no lightheadedness, no blurred vision, no focal deficit. Labs reviewed.  Potassium 6.2, bicarb 19, serum creatinine 1.86 above a baseline of 1.5 Patient was ordered calcium gluconate, dextrose, insulin, Lokelma and 500 cc of normal saline. He will be referred to observation status for further evaluation.    Review of Systems: As mentioned in the history of present illness. All other systems reviewed and are negative. Past Medical History:  Diagnosis Date   Anemia    iron treatments   Anxiety    Aortic atherosclerosis (HCC)    Arthritis    Cancer of groin ([place]) 2021   left groin, resected, radiation   Cataract    Complication of anesthesia    PONV   Coronary artery disease    Dizziness of unknown etiology    has led to seizures and passing out.   Family history of adverse reaction to anesthesia    PONV mother   GERD (gastroesophageal reflux disease)     History of complete heart block    PPM placed   Hyperlipidemia    Hypertension    LBBB (left bundle branch block)    Lymphedema of left leg    uses thigh high compression stockings   Melanoma ([place]) 2012   skin cancer, left thigh   OSA on CPAP    PONV (postoperative nausea and vomiting) 04/16/2019   Port-A-Cath in place    RIGHT chest wall   Presence of cardiac pacemaker    [employer]   Seizures ([place])    still has episodes of dizziness. last event 1 month ago (march 2022) and will pass out. takes clonazepam   Past Surgical History:  Procedure Laterality Date   CORONARY ARTERY BYPASS GRAFT N/A 08/10/2021   Procedure: CORONARY ARTERY BYPASS GRAFTING (CABG) X 3 USING LEFT INTERNAL MAMMARY ARTERY AND RIGHT GREATER SAPHENOUS VEIN;  Surgeon: Dahlia Byes, MD;  Location: [place];  Service: Open Heart Surgery;  Laterality: N/A;   CT RADIATION THERAPY GUIDE     left groin   dental implant     permanent implant   ENDOVEIN HARVEST OF GREATER SAPHENOUS VEIN Right 08/10/2021   Procedure: ENDOVEIN HARVEST OF GREATER SAPHENOUS VEIN;  Surgeon: Dahlia Byes, MD;  Location: [place];  Service: Open Heart Surgery;  Laterality: Right;   KNEE SURGERY Left    arthroscopy   LEFT HEART CATH AND CORONARY ANGIOGRAPHY Left 06/29/2017   Procedure: LEFT HEART CATH AND CORONARY ANGIOGRAPHY;  Surgeon: [name], MD;  Location: [facility]  INVASIVE CV LAB;  Service: Cardiovascular;  Laterality: Left;   LEFT HEART CATH AND CORONARY ANGIOGRAPHY N/A 08/08/2021   Procedure: LEFT HEART CATH AND CORONARY ANGIOGRAPHY;  Surgeon: [name], MD;  Location: [place] [facility];  Service: Cardiovascular;  Laterality: N/A;   LYMPH NODE DISSECTION Left 04/16/2019   Procedure: Left inguinal Lymph Node Dissection;  Surgeon: [name], MD;  Location: [place];  Service: General;  Laterality: Left;   MELANOMA EXCISION Left 04/16/2019   Procedure: MELANOMA EXCISION LEFT GROIN MASS;  Surgeon: [name], MD;  Location: [place];  Service: General;  Laterality: Left;   MELANOMA EXCISION WITH SENTINEL LYMPH NODE BIOPSY Left 2012   Left calf    PACEMAKER INSERTION N/A 08/26/2018   Procedure: INSERTION PACEMAKER;  Surgeon: [name], MD;  Location: [facility];  Service: Cardiovascular;  Laterality: N/A;   PORTA CATH INSERTION N/A 08/26/2019   Procedure: PORTA CATH INSERTION;  Surgeon: Katha Cabal, MD;  Location: [place] [facility];  Service: Cardiovascular;  Laterality: N/A;   SUPERFICIAL LYMPH NODE BIOPSY / EXCISION Left 2020   lymph nodes removed around left groin melanoma site   TEE WITHOUT CARDIOVERSION N/A 08/10/2021   Procedure: TRANSESOPHAGEAL ECHOCARDIOGRAM (TEE);  Surgeon: Dahlia Byes, MD;  Location: [place];  Service: Open Heart Surgery;  Laterality: N/A;   TEMPORARY PACEMAKER N/A 08/25/2018   Procedure: TEMPORARY PACEMAKER;  Surgeon: [name], MD;  Location: [place] [facility];  Service: Cardiovascular;  Laterality: N/A;   TOTAL HIP ARTHROPLASTY Left 07/14/2020   Procedure: TOTAL HIP ARTHROPLASTY;  Surgeon: Dereck Leep, MD;  Location: [facility];  Service: Orthopedics;  Laterality: Left;   Social History:  reports that he has never smoked. He has never used smokeless tobacco. He reports that he does not drink alcohol and does not use drugs.  Allergies  Allergen Reactions   Ibuprofen Other (See Comments)    Affects kidneys   Levetiracetam     Other reaction(s): Dizziness, Headache   Nsaids     Affects kidneys    Family History  Problem Relation Age of Onset   Cancer Paternal Grandmother     Prior to Admission medications   Medication Sig Start Date End Date Taking? Authorizing Provider  apixaban (ELIQUIS) 5 MG TABS tablet Take 5 mg by mouth 2 (two) times daily.    [provider]  aspirin 81 MG EC tablet Take 81 mg by mouth daily.    [provider]  atorvastatin (LIPITOR) 20 MG tablet Take 20 mg by mouth daily. 07/15/18   [provider]   Carboxymeth-Glyc-Polysorb PF (REFRESH OPTIVE MEGA-3) 0.5-1-0.5 % SOLN Place 1 drop into both eyes daily as needed (dry eyes).    [provider]  clonazePAM (KLONOPIN) 0.5 MG tablet TAKE 1 TABLET BY MOUTH IN THE MORNING AND 2 TABS BY MOUTH IN THE EVENING 01/30/22   Pitts, Edward Lav, MD  febuxostat (ULORIC) 40 MG tablet Take 40 mg by mouth daily. 11/03/21   [provider]  ferrous sulfate 325 (65 FE) MG EC tablet TAKE 1 TABLET BY MOUTH 2 TIMES DAILY WITH A MEAL. 12/14/21   [name], MD  hydrochlorothiazide (HYDRODIURIL) 25 MG tablet     [provider]  hydrocortisone 2.5 % ointment Apply 1 application. topically daily as needed (itching).    [provider]  Lacosamide (VIMPAT) 100 MG TABS TAKE 1 TABLET (100 MG TOTAL) BY MOUTH IN THE MORNING AND AT BEDTIME. 10/24/21  Ventura Sellers, MD  lidocaine-prilocaine (EMLA) cream Apply 1 application. topically as needed. Apply small amount of cream to port site approx 1-2 hours prior to appointment. 07/21/21   [name], MD  lisinopril (ZESTRIL) 20 MG tablet Take 20 mg by mouth daily.    [provider]  LORazepam (ATIVAN) 2 MG tablet TAKE 1 TABLET (2 MG TOTAL) BY MOUTH EVERY 8 (EIGHT) HOURS AS NEEDED FOR SEIZURE. 11/22/21   Pitts, Edward Lav, MD  metoprolol succinate (TOPROL-XL) 25 MG 24 hr tablet Take 25 mg by mouth daily. 12/15/18   [provider]  Multiple Vitamin (MULTIVITAMIN WITH MINERALS) TABS tablet Take 1 tablet by mouth daily. Centrum Silver    [provider]  omeprazole (PRILOSEC) 20 MG capsule TAKE 1 CAPSULE BY MOUTH EVERY DAY 11/26/21   [name], MD  ondansetron (ZOFRAN) 4 MG tablet TAKE 1 TABLET BY MOUTH EVERY 8 HOURS AS NEEDED FOR NAUSEA AND VOMITING 01/26/22   Borders, Kirt Boys, NP    Physical Exam: Vitals:   02/21/22 1120 02/21/22 1512  BP: 134/67 125/63  Pulse: 84 60  Resp: 18 20  Temp: 98.2 F (36.8 C)   TempSrc: Oral   SpO2: 96% 97%   Physical Exam Vitals and nursing  note reviewed.  Constitutional:      Appearance: He is obese.  HENT:     Head: Normocephalic and atraumatic.     Nose: Nose normal.     Mouth/Throat:     Mouth: Mucous membranes are moist.  Eyes:     Conjunctiva/sclera: Conjunctivae normal.  Cardiovascular:     Rate and Rhythm: Normal rate and regular rhythm.  Pulmonary:     Effort: Pulmonary effort is normal.     Breath sounds: Normal breath sounds.  Abdominal:     General: Bowel sounds are normal.     Palpations: Abdomen is soft.     Comments: Central adiposity  Musculoskeletal:        General: Normal range of motion.     Cervical back: Normal range of motion and neck supple.  Skin:    General: Skin is warm and dry.  Neurological:     General: No focal deficit present.     Mental Status: He is alert and oriented to person, place, and time.  Psychiatric:        Mood and Affect: Mood normal.        Behavior: Behavior normal.     Data Reviewed: Relevant notes from primary care and specialist visits, past discharge summaries as available in EHR, including Care Everywhere. Prior diagnostic testing as pertinent to current admission diagnoses Updated medications and problem lists for reconciliation ED course, including vitals, labs, imaging, treatment and response to treatment Triage notes, nursing and pharmacy notes and ED provider's notes Notable results as noted in HPI Labs reviewed.  BNP 29, troponin 5, sodium 137, potassium 6.2, chloride 115, bicarb 19, glucose 100, BUN 47, creatinine 1.86 compared to baseline of 1.5, calcium 9.2, white count 4.8, hemoglobin 11.4, hematocrit 35.2, platelet count 175 Chest x-ray reviewed by me shows no evidence of acute cardiopulmonary disease Twelve-lead EKG reviewed by me shows ventricular paced rhythm There are no new results to review at this time.  Assessment and Plan: * Hyperkalemia Secondary to ACE inhibitor We will discontinue lisinopril And also received dextrose, insulin and  Lokelma Will administer 1 L of bicarbonate infusion Repeat potassium levels  Coronary artery disease involving native coronary artery of native heart Status post  CABG Continue aspirin, metoprolol and atorvastatin  Complete heart block (HCC) Status post permanent pacemaker placement  Seizures (HCC) Continue lacosamide Seizure precautions  HTN (hypertension) Continue metoprolol Hold lisinopril and hydrochlorothiazide due to AKI  AKI (acute kidney injury) ([place]) Patient has a baseline serum creatinine of 1.5 and today on admission it is 1.82 He also has a normal anion gap metabolic acidosis Hold hydrochlorothiazide and lisinopril Hydrate patient with bicarbonate infusion repeat renal parameters in a.m.  Cholangiocarcinoma ([place]) Status post partial hepatectomy Follow-up with oncology as an outpatient  Obesity, Class III, BMI 40-49.9 (morbid obesity) (HCC) BMI 03.49 Complicates overall prognosis and care Lifestyle modification and exercise has been discussed with patient in detail      Advance Care Planning:   Code Status: Full Code   Consults: None  Family Communication: Greater than 50% of time was spent discussing patient's condition and plan of care with him at the bedside.  All questions and concerns have been addressed.  He verbalizes understanding and agrees with the plan  Severity of Illness: The appropriate patient status for this patient is INPATIENT. Inpatient status is judged to be reasonable and necessary in order to provide the required intensity of service to ensure the patient's safety. The patient's presenting symptoms, physical exam findings, and initial radiographic and laboratory data in the context of their chronic comorbidities is felt to place them at high risk for further clinical deterioration. Furthermore, it is not anticipated that the patient will be medically stable for discharge from the hospital within 2 midnights of admission.   * I certify that  at the point of admission it is my clinical judgment that the patient will require inpatient hospital care spanning beyond 2 midnights from the point of admission due to high intensity of service, high risk for further deterioration and high frequency of surveillance required.*  Author: Collier Bullock, MD 02/21/2022 3:41 PM  For on call review [email or web address].

## 2022-02-21 NOTE — ED Notes (Signed)
Requesting to have port accessed - notified provider.

## 2022-02-21 NOTE — Assessment & Plan Note (Addendum)
BMI 47.82 Complicates overall prognosis and care Lifestyle modification and exercise has been discussed with patient in detail

## 2022-02-21 NOTE — ED Provider Triage Note (Signed)
Emergency Medicine Provider Triage Evaluation Note  Mannie Ohlin , a 61 y.o. male  was evaluated in triage.  Pt complains of elevated K+ level per his doctor, told to come to ER right away.  Review of Systems  Positive:  Negative:   Physical Exam  BP 134/67 (BP Location: Left Arm)   Pulse 84   Temp 98.2 F (36.8 C) (Oral)   Resp 18   SpO2 96%  Gen:   Awake, no distress   Resp:  Normal effort  MSK:   Moves extremities without difficulty  Other:    Medical Decision Making  Medically screening exam initiated at 11:29 AM.  Appropriate orders placed.  Zafar Debrosse was informed that the remainder of the evaluation will be completed by another provider, this initial triage assessment does not replace that evaluation, and the importance of remaining in the ED until their evaluation is complete.     Versie Starks, PA-C 02/21/22 1130

## 2022-02-21 NOTE — ED Provider Notes (Signed)
[facility] Provider Note    Event Date/Time   First MD Initiated Contact with Patient 02/21/22 1230     (approximate)   History   Abnormal Labs   HPI  Edward Pitts is a 61 y.o. male who comes in from the cancer center for abnormal labs.  Patient noted to have a potassium of 6.1.  On review of records patient has a history of cholangiocarcinoma.  He was seen on 11/21 after having a robotic partial hepatectomy ectomy patient was given a dose of Kayexalate due to some hyperkalemia  Patient denies any significant this time.  Does report having intermittent shortness of breath since having the surgery but he states that he was placed on a blood thinner to ensure it does not have any blood clots.  He otherwise reports doing okay no swelling his legs.  Reports normal urination.   Physical Exam   Triage Vital Signs: ED Triage Vitals  Enc Vitals Group     BP 02/21/22 1120 134/67     Pulse Rate 02/21/22 1120 84     Resp 02/21/22 1120 18     Temp 02/21/22 1120 98.2 F (36.8 C)     Temp Source 02/21/22 1120 Oral     SpO2 02/21/22 1120 96 %     Weight --      Height --      Head Circumference --      Peak Flow --      Pain Score 02/21/22 1121 0     Pain Loc --      Pain Edu? --      Excl. in [place]? --     Most recent vital signs: Vitals:   02/21/22 1120  BP: 134/67  Pulse: 84  Resp: 18  Temp: 98.2 F (36.8 C)  SpO2: 96%     General: Awake, no distress.  CV:  Good peripheral perfusion.  Resp:  Normal effort.  Abd:  No distention.  Soft and nontender Other:  No swelling in legs   ED Results / Procedures / Treatments   Labs (all labs ordered are listed, but only abnormal results are displayed) Labs Reviewed  CBC WITH DIFFERENTIAL/PLATELET - Abnormal; Notable for the following components:      Result Value   RBC 3.87 (*)    Hemoglobin 11.4 (*)    HCT 35.2 (*)    All other components within normal limits  BASIC METABOLIC PANEL -  Abnormal; Notable for the following components:   Potassium 6.2 (*)    Chloride 115 (*)    CO2 19 (*)    Glucose, Bld 100 (*)    BUN 47 (*)    Creatinine, Ser 1.86 (*)    GFR, Estimated 40 (*)    Anion gap 3 (*)    All other components within normal limits     EKG  My interpretation of EKG:  Atrial sensed ventricular paced rhythm with a rate of 70 without any ST elevation or T wave inversions except for aVL, widened QRS  RADIOLOGY I have reviewed the xray personally and interpreted and no pleural effusion PROCEDURES:  Critical Care performed: Yes, see critical care procedure note(s)  .1-3 Lead EKG Interpretation  Performed by: Vanessa [place], MD Authorized by: Vanessa , MD     Interpretation: abnormal     ECG rate:  80   ECG rate assessment: normal     Rhythm: sinus rhythm     Ectopy: none  Conduction: normal   .Critical Care  Performed by: Vanessa [place], MD Authorized by: Vanessa [place], MD   Critical care provider statement:    Critical care time (minutes):  30   Critical care was necessary to treat or prevent imminent or life-threatening deterioration of the following conditions:  Renal failure   Critical care was time spent personally by me on the following activities:  Development of treatment plan with patient or surrogate, discussions with consultants, evaluation of patient's response to treatment, examination of patient, ordering and review of laboratory studies, ordering and review of radiographic studies, ordering and performing treatments and interventions, pulse oximetry, re-evaluation of patient's condition and review of old charts    MEDICATIONS ORDERED IN ED: Medications  sodium zirconium cyclosilicate (LOKELMA) packet 10 g (has no administration in time range)  insulin aspart (novoLOG) injection 5 Units (has no administration in time range)    And  dextrose 50 % solution 50 mL (has no administration in time range)  calcium gluconate 1 g/ 50 mL  sodium chloride IVPB (has no administration in time range)  sodium chloride 0.9 % bolus 500 mL (has no administration in time range)     IMPRESSION / MDM / ASSESSMENT AND PLAN / ED COURSE  I reviewed the triage vital signs and the nursing notes.   Patient's presentation is most consistent with acute presentation with potential threat to life or bodily function.   Patient comes in with concerns for elevated potassium.  Labs were redrawn to confirm.  He still urinating well.  His abdomen is soft and nontender.  Reports intermittent shortness of breath so added on EKG, troponin, chest x-ray, BNP.  Patient already on blood thinner   CBC shows stable hemoglobin.  No white count elevation  Potassium is 6.2 low bicarb and increasing creatinine up to 1.86 consider the possibility of PE but given patient already on blood thinner and patient's rising creatinine we will hold off on contrast and discontinue blood thinner for now given otherwise hemodynamically stable.  Patient's EKG overall looks reassuring does have paced rhythm but will give some calcium just given he does have a somatic QRSs that we may be hard to tell with pacing as well as treat for hyperkalemia and give some fluids to see if this will help correct AKI.  Will discuss with hospital team for admission  The patient is on the cardiac monitor to evaluate for evidence of arrhythmia and/or significant heart rate changes.      FINAL CLINICAL IMPRESSION(S) / ED DIAGNOSES   Final diagnoses:  AKI (acute kidney injury) ([place])  Hyperkalemia     Rx / DC Orders   ED Discharge Orders     None        Note:  This document was prepared using Dragon voice recognition software and may include unintentional dictation errors.   Vanessa [place], MD 02/21/22 [phone]

## 2022-02-21 NOTE — Assessment & Plan Note (Signed)
Status post CABG Continue aspirin, metoprolol and atorvastatin

## 2022-02-21 NOTE — Assessment & Plan Note (Signed)
Patient has a baseline serum creatinine of 1.5 and today on admission it is 1.82 He also has a normal anion gap metabolic acidosis Hold hydrochlorothiazide and lisinopril Hydrate patient with bicarbonate infusion repeat renal parameters in a.m.

## 2022-02-21 NOTE — Assessment & Plan Note (Signed)
Patient has no radiographic evidence of metastatic melanoma. Hold off additional immunotherapy. Continue surveillance

## 2022-02-22 ENCOUNTER — Encounter: Payer: Self-pay | Admitting: Oncology

## 2022-02-22 ENCOUNTER — Other Ambulatory Visit: Payer: Self-pay

## 2022-02-22 DIAGNOSIS — E875 Hyperkalemia: Secondary | ICD-10-CM | POA: Diagnosis not present

## 2022-02-22 DIAGNOSIS — C221 Intrahepatic bile duct carcinoma: Secondary | ICD-10-CM

## 2022-02-22 LAB — CBC
HCT: 32 % — ABNORMAL LOW (ref 39.0–52.0)
Hemoglobin: 10.4 g/dL — ABNORMAL LOW (ref 13.0–17.0)
MCH: 29 pg (ref 26.0–34.0)
MCHC: 32.5 g/dL (ref 30.0–36.0)
MCV: 89.1 fL (ref 80.0–100.0)
Platelets: 137 10*3/uL — ABNORMAL LOW (ref 150–400)
RBC: 3.59 MIL/uL — ABNORMAL LOW (ref 4.22–5.81)
RDW: 13.5 % (ref 11.5–15.5)
WBC: 4.3 10*3/uL (ref 4.0–10.5)
nRBC: 0 % (ref 0.0–0.2)

## 2022-02-22 LAB — BASIC METABOLIC PANEL
Anion gap: 5 (ref 5–15)
BUN: 37 mg/dL — ABNORMAL HIGH (ref 8–23)
CO2: 20 mmol/L — ABNORMAL LOW (ref 22–32)
Calcium: 9.2 mg/dL (ref 8.9–10.3)
Chloride: 115 mmol/L — ABNORMAL HIGH (ref 98–111)
Creatinine, Ser: 1.37 mg/dL — ABNORMAL HIGH (ref 0.61–1.24)
GFR, Estimated: 58 mL/min — ABNORMAL LOW (ref >60)
Glucose, Bld: 98 mg/dL (ref 70–99)
Potassium: 5.1 mmol/L (ref 3.5–5.1)
Sodium: 140 mmol/L (ref 135–145)

## 2022-02-22 MED ORDER — CAPECITABINE 150 MG PO TABS: 300.0000 mg | ORAL_TABLET | Freq: Two times a day (BID) | ORAL | 0 refills | Status: DC

## 2022-02-22 MED ORDER — CAPECITABINE 500 MG PO TABS: 1500.0000 mg | ORAL_TABLET | Freq: Two times a day (BID) | ORAL | 0 refills | Status: DC

## 2022-02-22 MED ORDER — HEPARIN SOD (PORK) LOCK FLUSH 100 UNIT/ML IV SOLN
500.0000 [IU] | Freq: Once | INTRAVENOUS | Status: AC
  Administered 2022-02-22: 500 [IU] via INTRAVENOUS
  Filled 2022-02-22: qty 5

## 2022-02-22 MED ORDER — CHLORHEXIDINE GLUCONATE CLOTH 2 % EX PADS: 6.0000 | MEDICATED_PAD | Freq: Every day | CUTANEOUS | Status: DC

## 2022-02-22 MED ORDER — HYDROCHLOROTHIAZIDE 25 MG PO TABS: 25.0000 mg | ORAL_TABLET | Freq: Every day | ORAL | 0 refills | Status: DC

## 2022-02-22 NOTE — Progress Notes (Signed)
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[facility]) Staging form: Intrahepatic Bile Duct, AJCC 8th Edition - Pathologic stage from 01/25/2022: Stage Unknown (pT1a, pNX, cM0) - Signed by [name], MD on 02/21/2022  Malignant melanoma of overlapping sites [facility]) Staging form: Melanoma of the Skin, AJCC 8th Edition - Pathologic: Stage Unknown (rpTX, pN1b, cM0) - Signed by [name], MD on 07/27/2020 - Pathologic: No stage assigned - Unsigned   Malignant melanoma of overlapping sites [facility]) Patient has no radiographic evidence of metastatic melanoma. Hold off additional immunotherapy. 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  Recommend concurrent chemotherapy Xeloda 837m/m2 BID with RT followed by 4 months of Xeloda [1000 mg/m2 BID for 14 of every 21 days] Rationale and potential side effects were reviewed and discussed.  Refer to RadOnc.  Check insurance approval.   Anemia in chronic kidney disease Continue monitor.   Hyperkalemia Recommend patient to go to ER for further management.    No orders of the defined types were placed in this encounter.  Follow-up To be determined. All questions were answered. The patient knows to call the clinic with any problems, questions or concerns.  ZEarlie Server MD, PhD [facility] Hematology Oncology 02/21/2022   CHIEF COMPLAINTS/REASON FOR VISIT:  Follow up for melanoma HISTORY OF PRESENTING ILLNESS:   Edward Hopfenspergeris a  61y.o.  male presents for recurrent malignant melanoma.    Oncology History  Malignant melanoma of overlapping sites ([facility]  04/16/2019 Cancer Staging   Staging form: Melanoma of the Skin, AJCC 8th Edition - Pathologic: Stage Unknown (rpTX, pN1b, cM0) - Signed by YEarlie Server MD on 07/27/2020 Stage prefix: Recurrence    04/23/2019 Initial Diagnosis   Malignant melanoma   -He has a history of left lower extremity melanoma in 2011, status post local excision -04/16/2019 patient underwent left groin mass resection  Resection pathology showed malignant melanoma, replacing a lymph node, with extracapsular extension, peripheral and deep margins involved.  Left inguinal contents, all 7 lymph nodes were negative for melanoma in the lymph nodes. Extranodal melanoma identified in lymphatic and interstitium between nodes -PDL1 80% TPS    07/07/2019 -  Radiation Therapy   status post adjuvant radiation.   07/23/2019 - 07/21/2021 Chemotherapy   Nivolumab q14d      06/29/2020 Imaging   CT chest abdomen pelvis showed stable postoperative appearance of the left groin.  No evidence of local recurrence.  No evidence of metastatic disease in the chest abdomen or pelvis.  Hepatic steatosis.  Stable subcentimeter fluid attenuation lesion of the lateral right lobe of the liver, likely benign cyst or hemangioma.  Coronary artery disease.  Aortic atherosclerosis   10/20/2020 Imaging   CT chest abdomen pelvis showed stable postoperative/radiation appearance of the left groin.  No evidence of local recurrence/metastatic disease within the chest abdomen/pelvis.  Fatty liver disease.  Diverticulosis without evidence of typhlitis.  Aortic atherosclerosis   03/10/2021 Imaging   MRI brain without contrast showed no definitive evidence of intracranial metastatic disease.  Study is limited by absence of intravenous contrast.     04/26/2021 Imaging   CT chest abdomen  pelvis without contrast showed stable post operative changes of left groin with no evidence of recurrent disease.  No  evidence of metastatic disease in the chest abdomen pelvis.  Aortic atherosclerosis   08/08/2021 - 08/16/2021 Hospital Admission    patient was hospitalized due to NSTEMI status post CABG x3.  He also had pacemaker The echocardiogram showed left ventricular ejection fraction of 65 to 70%,   09/15/2021 Imaging   CT chest abdomen pelvis w contrast  IMPRESSION: 1. Subtle hypodense 9 mm lesion in the left lobe of the liver is new from prior imaging including previous contrasted CT dating back to December 10, 2019, with the lesion appearing to equilibrate with background liver on delayed imaging sequence but is incompletely evaluated on this imaging study and technically nonspecific possibly reflecting a benign perfusional variant and while its appearance is not typical for that of a melanoma metastasis, it is not excluded on this examination. Suggest more definitive characterization by hepatic protocol MRI with and without contrast. 2. Stable postoperative changes in the left groin without evidence of local recurrent disease.3. No evidence of metastatic disease in the chest or pelvis.4.  Aortic Atherosclerosis (ICD10-I70.0).      09/23/2021 Imaging   Contrast-enhanced liver ultrasound Mildly hypoenhancing 2.2 cm mass in the posterior aspect of the left lobe of the liver with washout characteristics concerning for non hepatocellular malignancy, concerning for melanotic metastasis given history. The lesion is in an unfavorable location for percutaneous biopsy. Consider PET-CT for further characterization   10/21/2021 -  Chemotherapy   Nivolumab q14d      11/10/2021 Imaging   MRI Brain w wo contrast Negative for metastatic disease to the brain   11/10/2021 Imaging   MRI abdomen w wo contrast 1. Lesion of the posterior superior left lobe of the liver, hepatic segment II, measuring 1.9 x 1.8 cm corresponding to findings of prior imaging. Evaluation is somewhat limited breath motion artifact however  there is subtle associated rim enhancement of this lesion. Findings are most in keeping with a hepatic metastasis in the setting of known recurrent melanoma. 2. Mild hepatic steatosis. 3. Cardiomegaly.    Cholangiocarcinoma ([place])  12/19/2021 Initial Diagnosis   Liver biopsy showed carcinoma  -The slides on the patient's prior left inguinal lymph node biopsy ([id number]) with metastatic melanoma were reviewed in conjunction with  this case. The morphology of the malignant cells between the two cases are dissimilar. A limited panel of immunohistochemical stains was performed and the neoplastic cells are positive for superpancytokeratin, cytokeratin 7 (diffuse, strong), cytokeratin 20 (patchy, moderate) and negative for S100, SOX-10, and HepPar-1.These findings are consistent with carcinoma. The morphologic findings and pattern of immunohistochemical staining are non-specific and possible sites of origin include but are not limited to pancreaticobiliary tract, GI tract, prostate, kidney, lung, and breast. Per [facility], the patient had a PET scan performed in July 2023 which demonstrated a single hepatic  hypermetabolic lesion without other sites of disease.    01/19/2022 Surgery   Liver lesion resection at [facility] by Dr.Zani  A.  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  Tumor extends to hepatic parenchymal margin, but all other margins are negative for tumor. See synoptic report and comment.   Background liver with: Mild steatosis (20%).  No definitive evidence of steatohepatitis.   Mild periportal fibrosis (trichrome and reticulin). No stainable iron (Prussian blue stain).  No evidence to support alpha-1-antitrypsin deficiency on PAS-D stain.   pT1a  pNx   01/25/2022 Cancer Staging   Staging form: Intrahepatic Bile Duct, AJCC 8th Edition - Pathologic stage from 01/25/2022: Stage Unknown (pT1a, pNX, cM0) - Signed by [name], MD  on 02/21/2022 Stage prefix: Initial diagnosis    04/18/2021, patient establish care with neurology Dr. Mickeal Skinner for intermittent altered cognition.  he was recommended to start Vimpat.    INTERVAL HISTORY Edward Pitts is a 61 y.o. male who has above history reviewed by me today presents for follow up visit for management of inguinal nodal recurrence of melanoma, resected T1a cholangiocarcinoma.  His potassium level was elevated at 5.9 during his recent follow up visit at [facility].  He was recommended to start on "potassium binder" and his insurance did not approve. Today he has no new complaints. Denies any chest pain, SOB.    :Review of Systems  Constitutional:  Negative for appetite change, chills, fatigue, fever and unexpected weight change.  HENT:   Negative for hearing loss and voice change.   Eyes:  Negative for eye problems and icterus.  Respiratory:  Negative for chest tightness, cough and shortness of breath.   Cardiovascular:  Positive for leg swelling. Negative for chest pain.  Gastrointestinal:  Negative for abdominal distention and abdominal pain.  Endocrine: Negative for hot flashes.  Genitourinary:  Negative for difficulty urinating, dysuria and frequency.   Musculoskeletal:        Status post left hip replacement  Skin:  Negative for itching and rash.       Skin hypo-pigmentation on upper extremities, no change  Neurological:  Negative for light-headedness and numbness.       Spells  Hematological:  Negative for adenopathy. Does not bruise/bleed easily.  Psychiatric/Behavioral:  Negative for confusion.     MEDICAL HISTORY:  Past Medical History:  Diagnosis Date   Anemia    iron treatments   Anxiety    Aortic atherosclerosis ([facility])    Arthritis    Cancer of groin ([place]) 2021   left groin, resected, radiation   Cataract    Complication of anesthesia    PONV   Coronary artery disease    Dizziness of unknown etiology    has led to seizures and passing out.   Family  history of adverse reaction to anesthesia    PONV mother   GERD (gastroesophageal reflux disease)    History of complete heart block    PPM placed   Hyperlipidemia    Hypertension    LBBB (left bundle branch block)    Lymphedema of left leg    uses thigh high compression stockings   Melanoma ([place]) 2012   skin cancer, left thigh   OSA on CPAP    PONV (postoperative nausea and vomiting) 04/16/2019   Port-A-Cath in place    RIGHT chest wall   Presence of cardiac pacemaker    [employer]   Seizures ([place])    still has episodes of dizziness. last event 1 month ago (march 2022) and will pass out. takes clonazepam    SURGICAL HISTORY: Past Surgical History:  Procedure Laterality Date   CORONARY ARTERY BYPASS GRAFT N/A 08/10/2021   Procedure: CORONARY ARTERY BYPASS GRAFTING (CABG) X 3 USING LEFT INTERNAL MAMMARY ARTERY AND RIGHT GREATER SAPHENOUS VEIN;  Surgeon: Dahlia Byes, MD;  Location: [place];  Service: Open Heart Surgery;  Laterality: N/A;   CT RADIATION THERAPY GUIDE     left groin   dental implant     permanent implant   ENDOVEIN HARVEST OF GREATER  SAPHENOUS VEIN Right 08/10/2021   Procedure: ENDOVEIN HARVEST OF GREATER SAPHENOUS VEIN;  Surgeon: Dahlia Byes, MD;  Location: [place];  Service: Open Heart Surgery;  Laterality: Right;   KNEE SURGERY Left    arthroscopy   LEFT HEART CATH AND CORONARY ANGIOGRAPHY Left 06/29/2017   Procedure: LEFT HEART CATH AND CORONARY ANGIOGRAPHY;  Surgeon: [name], MD;  Location: [place] [facility];  Service: Cardiovascular;  Laterality: Left;   LEFT HEART CATH AND CORONARY ANGIOGRAPHY N/A 08/08/2021   Procedure: LEFT HEART CATH AND CORONARY ANGIOGRAPHY;  Surgeon: [name], MD;  Location: [place] [facility];  Service: Cardiovascular;  Laterality: N/A;   LYMPH NODE DISSECTION Left 04/16/2019   Procedure: Left inguinal Lymph Node Dissection;  Surgeon: [name], MD;  Location: [place];  Service: General;  Laterality: Left;    MELANOMA EXCISION Left 04/16/2019   Procedure: MELANOMA EXCISION LEFT GROIN MASS;  Surgeon: [name], MD;  Location: [place];  Service: General;  Laterality: Left;   MELANOMA EXCISION WITH SENTINEL LYMPH NODE BIOPSY Left 2012   Left calf    PACEMAKER INSERTION N/A 08/26/2018   Procedure: INSERTION PACEMAKER;  Surgeon: [name], MD;  Location: [facility];  Service: Cardiovascular;  Laterality: N/A;   PORTA CATH INSERTION N/A 08/26/2019   Procedure: PORTA CATH INSERTION;  Surgeon: Katha Cabal, MD;  Location: [place] [facility];  Service: Cardiovascular;  Laterality: N/A;   SUPERFICIAL LYMPH NODE BIOPSY / EXCISION Left 2020   lymph nodes removed around left groin melanoma site   TEE WITHOUT CARDIOVERSION N/A 08/10/2021   Procedure: TRANSESOPHAGEAL ECHOCARDIOGRAM (TEE);  Surgeon: Dahlia Byes, MD;  Location: [place];  Service: Open Heart Surgery;  Laterality: N/A;   TEMPORARY PACEMAKER N/A 08/25/2018   Procedure: TEMPORARY PACEMAKER;  Surgeon: [name], MD;  Location: [place] [facility];  Service: Cardiovascular;  Laterality: N/A;   TOTAL HIP ARTHROPLASTY Left 07/14/2020   Procedure: TOTAL HIP ARTHROPLASTY;  Surgeon: Dereck Leep, MD;  Location: [facility];  Service: Orthopedics;  Laterality: Left;    SOCIAL HISTORY: Social History   Socioeconomic History   Marital status: Married    Spouse name: Vicente Males    Number of children: 7   Years of education: 12   Highest education level: Not on file  Occupational History    Comment: disability  Tobacco Use   Smoking status: Never   Smokeless tobacco: Never  Vaping Use   Vaping Use: Never used  Substance and Sexual Activity   Alcohol use: No   Drug use: No   Sexual activity: Not Currently  Other Topics Concern   Not on file  Social History Narrative   Lives with  Wife,   Has 2 small dogs   Caffeine use: sodas (2 per day)      Out of work on disability.  Has a walk in shower. No stairs to climb   Oncology  treatment ongoing. Uses port a cath for treatment.      pacemaker   Social Determinants of Health   Financial Resource Strain: Low Risk  (02/12/2019)   Overall Financial Resource Strain (CARDIA)    Difficulty of Paying Living Expenses: Not hard at all  Food Insecurity: Food Insecurity Present (02/21/2022)   Hunger Vital Sign    Worried About Running Out of Food in the Last Year: Sometimes true    Ran Out of Food in the Last Year: Often true  Transportation Needs: No Transportation Needs (02/21/2022)  PRAPARE - Hydrologist (Medical): No    Lack of Transportation (Non-Medical): No  Physical Activity: Unknown (02/12/2019)   Exercise Vital Sign    Days of Exercise per Week: 0 days    Minutes of Exercise per Session: Not on file  Stress: No Stress Concern Present (02/12/2019)   [place]    Feeling of Stress : Only a little  Social Connections: Unknown (02/12/2019)   Social Connection and Isolation Panel [NHANES]    Frequency of Communication with Friends and Family: More than three times a week    Frequency of Social Gatherings with Friends and Family: Not on file    Attends Religious Services: Not on file    Active Member of Clubs or Organizations: Not on file    Attends Archivist Meetings: Not on file    Marital Status: Married  Intimate Partner Violence: Not At Risk (02/21/2022)   Humiliation, Afraid, Rape, and Kick questionnaire    Fear of Current or Ex-Partner: No    Emotionally Abused: No    Physically Abused: No    Sexually Abused: No    FAMILY HISTORY: Family History  Problem Relation Age of Onset   Cancer Paternal Grandmother     ALLERGIES:  is allergic to ibuprofen, levetiracetam, and nsaids.  MEDICATIONS:  Current Outpatient Medications  Medication Sig Dispense Refill   apixaban (ELIQUIS) 5 MG TABS tablet Take 5 mg by mouth 2 (two) times daily.      aspirin 81 MG EC tablet Take 81 mg by mouth daily.     atorvastatin (LIPITOR) 20 MG tablet Take 20 mg by mouth daily.     Carboxymeth-Glyc-Polysorb PF (REFRESH OPTIVE MEGA-3) 0.5-1-0.5 % SOLN Place 1 drop into both eyes daily as needed (dry eyes).     clonazePAM (KLONOPIN) 0.5 MG tablet TAKE 1 TABLET BY MOUTH IN THE MORNING AND 2 TABS BY MOUTH IN THE EVENING 90 tablet 0   febuxostat (ULORIC) 40 MG tablet Take 40 mg by mouth daily.     ferrous sulfate 325 (65 FE) MG EC tablet TAKE 1 TABLET BY MOUTH 2 TIMES DAILY WITH A MEAL. 180 tablet 1   hydrocortisone 2.5 % ointment Apply 1 application. topically daily as needed (itching).     Lacosamide (VIMPAT) 100 MG TABS TAKE 1 TABLET (100 MG TOTAL) BY MOUTH IN THE MORNING AND AT BEDTIME. 60 tablet 3   lidocaine-prilocaine (EMLA) cream Apply 1 application. topically as needed. Apply small amount of cream to port site approx 1-2 hours prior to appointment. 30 g 11   metoprolol succinate (TOPROL-XL) 25 MG 24 hr tablet Take 25 mg by mouth daily.     Multiple Vitamin (MULTIVITAMIN WITH MINERALS) TABS tablet Take 1 tablet by mouth daily. Centrum Silver     omeprazole (PRILOSEC) 20 MG capsule TAKE 1 CAPSULE BY MOUTH EVERY DAY 90 capsule 0   ondansetron (ZOFRAN) 4 MG tablet TAKE 1 TABLET BY MOUTH EVERY 8 HOURS AS NEEDED FOR NAUSEA AND VOMITING 90 tablet 1   hydrochlorothiazide (HYDRODIURIL) 25 MG tablet Take 1 tablet (25 mg total) by mouth daily. 30 tablet 0   No current facility-administered medications for this visit.   Facility-Administered Medications Ordered in Other Visits  Medication Dose Route Frequency Provider Last Rate Last Admin   heparin lock flush 100 UNIT/ML injection            heparin lock flush 100 UNIT/ML injection  PHYSICAL EXAMINATION: ECOG PERFORMANCE STATUS: 1 - Symptomatic but completely ambulatory Vitals:   02/21/22 1007  BP: 125/66  Pulse: 70  Resp: 18  Temp: (!) 96.7 F (35.9 C)   Filed Weights   02/21/22 1007   Weight: 241 lb 6.4 oz (109.5 kg)    Physical Exam Constitutional:      General: He is not in acute distress.    Comments: Patient ambulates independently  HENT:     Head: Normocephalic and atraumatic.  Eyes:     General: No scleral icterus.    Pupils: Pupils are equal, round, and reactive to light.  Cardiovascular:     Rate and Rhythm: Normal rate and regular rhythm.     Heart sounds: Normal heart sounds.  Pulmonary:     Effort: Pulmonary effort is normal. No respiratory distress.     Breath sounds: No wheezing.  Abdominal:     General: Bowel sounds are normal. There is no distension.     Palpations: Abdomen is soft. There is no mass.     Tenderness: There is no abdominal tenderness.     Comments:    Musculoskeletal:        General: No deformity. Normal range of motion.     Cervical back: Normal range of motion and neck supple.     Comments: Left lower extremity edema-chronic   Skin:    General: Skin is warm and dry.  Neurological:     Mental Status: He is alert and oriented to person, place, and time. Mental status is at baseline.     Cranial Nerves: No cranial nerve deficit.     Coordination: Coordination normal.  Psychiatric:        Mood and Affect: Mood normal.     LABORATORY DATA:  I have reviewed the data as listed    Latest Ref Rng & Units 02/22/2022    5:15 AM 02/21/2022   11:24 AM 02/21/2022    9:39 AM  CBC  WBC 4.0 - 10.5 K/uL 4.3  4.8  4.9   Hemoglobin 13.0 - 17.0 g/dL 10.4  11.4  11.2   Hematocrit 39.0 - 52.0 % 32.0  35.2  35.1   Platelets 150 - 400 K/uL 137  175  168       Latest Ref Rng & Units 02/22/2022    5:15 AM 02/21/2022   10:40 PM 02/21/2022    8:29 PM  CMP  Glucose 70 - 99 mg/dL 98  95  94   BUN 8 - 23 mg/dL 37   41   Creatinine 0.61 - 1.24 mg/dL 1.37   1.43   Sodium 135 - 145 mmol/L 140   139   Potassium 3.5 - 5.1 mmol/L 5.1   5.6   Chloride 98 - 111 mmol/L 115   115   CO2 22 - 32 mmol/L 20   20   Calcium 8.9 - 10.3 mg/dL 9.2    9.1      RADIOGRAPHIC STUDIES: I have personally reviewed the radiological images as listed and agreed with the findings in the report. DG Chest Portable 1 View  Result Date: 02/21/2022 CLINICAL DATA:  Shortness of breath EXAM: PORTABLE CHEST 1 VIEW COMPARISON:  09/14/2021 FINDINGS: Left-sided implanted cardiac device and right chest port remain in place. Stable heart size status post sternotomy and CABG. No focal airspace consolidation, pleural effusion, or pneumothorax. IMPRESSION: No active disease. Electronically Signed   By: Davina Poke D.O.   On: 02/21/2022 13:43  [place] CORE BIOPSY (LIVER)  Result Date: 12/19/2021 INDICATION: 61 year old male with history of melanoma presenting with indeterminate left lobe liver mass. EXAM: ULTRASOUND GUIDED LIVER LESION BIOPSY COMPARISON:  None Available. MEDICATIONS: None ANESTHESIA/SEDATION: Moderate (conscious) sedation was employed during this procedure. A total of Versed 2 mg and Fentanyl 100 mcg was administered intravenously. Moderate Sedation Time: 10 minutes. The patient's level of consciousness and vital signs were monitored continuously by radiology nursing throughout the procedure under my direct supervision. COMPLICATIONS: None immediate. PROCEDURE: Informed written consent was obtained from the patient after a discussion of the risks, benefits and alternatives to treatment. The patient understands and consents the procedure. A timeout was performed prior to the initiation of the procedure. Ultrasound scanning was performed of the subxiphoid region demonstrates heterogeneously hypoechoic mass in the posterior left lobe of the liver which measures approximately 2.0 X 2.9 cm. The left lobe liver mass was selected for biopsy and the procedure was planned. The right upper abdominal quadrant was prepped and draped in the usual sterile fashion. The overlying soft tissues were anesthetized with 1% lidocaine with epinephrine. A 17 gauge, 6.8 cm co-axial  needle was advanced into a peripheral aspect of the lesion. This was followed by 3 core biopsies with an 18 gauge core device under direct ultrasound guidance. The coaxial needle tract was embolized with a small amount of Gel-Foam slurry and superficial hemostasis was obtained with manual compression. Post procedural scanning was negative for definitive area of hemorrhage or additional complication. A dressing was placed. The patient tolerated the procedure well without immediate post procedural complication. IMPRESSION: Technically successful ultrasound guided core needle biopsy of left lobe liver mass. Ruthann Cancer, MD [facility] Electronically Signed   By: Ruthann Cancer M.D.   On: 12/19/2021 13:07

## 2022-02-22 NOTE — Assessment & Plan Note (Signed)
Continue monitor

## 2022-02-22 NOTE — Assessment & Plan Note (Addendum)
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  Recommend concurrent chemotherapy Xeloda '825mg'$ /m2 BID with RT followed by 4 months of Xeloda [1000 mg/m2 BID for 14 of every 21 days] Rationale and potential side effects were reviewed and discussed.  Refer to RadOnc.  Check insurance approval.

## 2022-02-22 NOTE — Addendum Note (Signed)
Addended by: [name] on: 02/22/2022 11:13 PM   Modules accepted: Orders

## 2022-02-22 NOTE — Discharge Summary (Signed)
Physician Discharge Summary  Gemini Beaumier XTG:[id number] DOB: 28-Oct-1960 DOA: 02/21/2022  PCP: Mechele Claude, FNP  Admit date: 02/21/2022 Discharge date: 02/22/2022  Admitted From: Home Disposition: Home  Recommendations for Outpatient Follow-up:  Follow up with PCP in 1-2 weeks Please obtain BMP/CBC in one week   Home Health: N/A Equipment/Devices: N/A  Discharge Condition: Stable CODE STATUS: Full code Diet recommendation: Low-salt, low potassium diet.  Discharge summary:  61 year old gentleman has history of cholangiocarcinoma status post partial hepatectomy, history of hypertension, complete heart block status post pacemaker, CKD stage IIIa, coronary artery disease status post CABG sent to the ER from cancer center with abnormal labs.  On routine exam he was found to have potassium of 6.1.  Patient did not have any acute symptoms.  He does take lisinopril for blood pressure control, currently hydrochlorothiazide was discontinued.  He also drinks a lot of electrolyte waters and solutions.  EKG was without any acute changes.  In the emergency room he was given calcium gluconate, dextrose, insulin, Lokelma and 500 cc of normal saline and monitored overnight.  Overnight no acute events.  Potassium 5.1.  Creatinine is 1.37 that has improved from 1.8.  Acute kidney injury on chronic kidney disease stage III AA Hyperkalemia without complication, likely contributed by ACE inhibitor with underlying CKD.  May have dietary indiscretion.  Plan: -Stable for discharge home.  Will benefit with repeat BMP in about 1 week. -We will discontinue lisinopril altogether due to severe hyperkalemia.  Blood pressures are well controlled on metoprolol, will start patient on hydrochlorothiazide 25 mg daily that he used to take in the past. -No other changes on his long-term medications were done.   Discharge Diagnoses:  Principal Problem:   Hyperkalemia Active Problems:   HTN (hypertension)    Seizures (HCC)   Complete heart block (HCC)   Coronary artery disease involving native coronary artery of native heart   AKI (acute kidney injury) (HCC)   Obesity, Class III, BMI 40-49.9 (morbid obesity) ([place])   Cholangiocarcinoma ([place])    Discharge Instructions  Discharge Instructions     Diet - low sodium heart healthy   Complete by: As directed    Increase activity slowly   Complete by: As directed       Allergies as of 02/22/2022       Reactions   Ibuprofen Other (See Comments)   Affects kidneys   Levetiracetam    Other reaction(s): Dizziness, Headache   Nsaids    Affects kidneys        Medication List     STOP taking these medications    lisinopril 20 MG tablet Commonly known as: ZESTRIL   LORazepam 2 MG tablet Commonly known as: ATIVAN       TAKE these medications    aspirin EC 81 MG tablet Take 81 mg by mouth daily.   atorvastatin 20 MG tablet Commonly known as: LIPITOR Take 20 mg by mouth daily.   clonazePAM 0.5 MG tablet Commonly known as: KLONOPIN TAKE 1 TABLET BY MOUTH IN THE MORNING AND 2 TABS BY MOUTH IN THE EVENING   Eliquis 5 MG Tabs tablet Generic drug: apixaban Take 5 mg by mouth 2 (two) times daily.   febuxostat 40 MG tablet Commonly known as: ULORIC Take 40 mg by mouth daily.   ferrous sulfate 325 (65 FE) MG EC tablet TAKE 1 TABLET BY MOUTH 2 TIMES DAILY WITH A MEAL.   hydrochlorothiazide 25 MG tablet Commonly known as: HYDRODIURIL Take 1 tablet (  25 mg total) by mouth daily. What changed:  how much to take how to take this when to take this   hydrocortisone 2.5 % ointment Apply 1 application. topically daily as needed (itching).   Lacosamide 100 MG Tabs Commonly known as: Vimpat TAKE 1 TABLET (100 MG TOTAL) BY MOUTH IN THE MORNING AND AT BEDTIME.   lidocaine-prilocaine cream Commonly known as: EMLA Apply 1 application. topically as needed. Apply small amount of cream to port site approx 1-2 hours prior to  appointment.   metoprolol succinate 25 MG 24 hr tablet Commonly known as: TOPROL-XL Take 25 mg by mouth daily.   multivitamin with minerals Tabs tablet Take 1 tablet by mouth daily. Centrum Silver   omeprazole 20 MG capsule Commonly known as: PRILOSEC TAKE 1 CAPSULE BY MOUTH EVERY DAY   ondansetron 4 MG tablet Commonly known as: ZOFRAN TAKE 1 TABLET BY MOUTH EVERY 8 HOURS AS NEEDED FOR NAUSEA AND VOMITING   Refresh Optive Mega-3 0.5-1-0.5 % Soln Generic drug: Carboxymeth-Glyc-Polysorb PF Place 1 drop into both eyes daily as needed (dry eyes).        Allergies  Allergen Reactions   Ibuprofen Other (See Comments)    Affects kidneys   Levetiracetam     Other reaction(s): Dizziness, Headache   Nsaids     Affects kidneys    Consultations: None   Procedures/Studies: DG Chest Portable 1 View  Result Date: 02/21/2022 CLINICAL DATA:  Shortness of breath EXAM: PORTABLE CHEST 1 VIEW COMPARISON:  09/14/2021 FINDINGS: Left-sided implanted cardiac device and right chest port remain in place. Stable heart size status post sternotomy and CABG. No focal airspace consolidation, pleural effusion, or pneumothorax. IMPRESSION: No active disease. Electronically Signed   By: Davina Poke D.O.   On: 02/21/2022 13:43   (Echo, Carotid, EGD, Colonoscopy, ERCP)    Subjective: Patient seen and examined.  Denies any complaints.  Wondering about going home.  He drove to the emergency room.   Discharge Exam: Vitals:   02/22/22 0710 02/22/22 0843  BP: 118/64 126/66  Pulse: (!) 59 62  Resp: 18 18  Temp: 97.9 F (36.6 C) 97.9 F (36.6 C)  SpO2: 98% 98%   Vitals:   02/22/22 0125 02/22/22 0510 02/22/22 0710 02/22/22 0843  BP: (!) 106/57 106/62 118/64 126/66  Pulse: 61 (!) 59 (!) 59 62  Resp:  '18 18 18  '$ Temp:  97.8 F (36.6 C) 97.9 F (36.6 C) 97.9 F (36.6 C)  TempSrc:  Oral Oral   SpO2:  99% 98% 98%  Weight:      Height:        General: Pt is alert, awake, not in acute  distress Sitting in chair.  On room air. Cardiovascular: RRR, S1/S2 +, no rubs, no gallops, pacemaker left precordium.  Port-A-Cath on the right chest wall. Respiratory: CTA bilaterally, no wheezing, no rhonchi Abdominal: Soft, NT, ND, bowel sounds + Extremities: no edema, no cyanosis    The results of significant diagnostics from this hospitalization (including imaging, microbiology, ancillary and laboratory) are listed below for reference.     Microbiology: No results found for this or any previous visit (from the past 240 hour(s)).   Labs: BNP (last 3 results) Recent Labs    05/27/21 0942 02/21/22 1136  BNP 5.8 35.4   Basic Metabolic Panel: Recent Labs  Lab 02/21/22 0939 02/21/22 1124 02/21/22 2029 02/21/22 2240 02/22/22 0515  NA 136 137 139  --  140  K 6.1* 6.2* 5.6*  --  5.1  CL 113* 115* 115*  --  115*  CO2 18* 19* 20*  --  20*  GLUCOSE 109* 100* 94 95 98  BUN 47* 47* 41*  --  37*  CREATININE 1.82* 1.86* 1.43*  --  1.37*  CALCIUM 9.1 9.2 9.1  --  9.2   Liver Function Tests: Recent Labs  Lab 02/21/22 0939  AST 27  ALT 44  ALKPHOS 74  BILITOT 0.4  PROT 7.6  ALBUMIN 4.2   No results for input(s): "LIPASE", "AMYLASE" in the last 168 hours. No results for input(s): "AMMONIA" in the last 168 hours. CBC: Recent Labs  Lab 02/21/22 0939 02/21/22 1124 02/22/22 0515  WBC 4.9 4.8 4.3  NEUTROABS 3.1 2.9  --   HGB 11.2* 11.4* 10.4*  HCT 35.1* 35.2* 32.0*  MCV 92.6 91.0 89.1  PLT 168 175 137*   Cardiac Enzymes: No results for input(s): "CKTOTAL", "CKMB", "CKMBINDEX", "TROPONINI" in the last 168 hours. BNP: Invalid input(s): "POCBNP" CBG: Recent Labs  Lab 02/21/22 1503 02/21/22 1620  GLUCAP 91 99   D-Dimer No results for input(s): "DDIMER" in the last 72 hours. Hgb A1c No results for input(s): "HGBA1C" in the last 72 hours. Lipid Profile No results for input(s): "CHOL", "HDL", "LDLCALC", "TRIG", "CHOLHDL", "LDLDIRECT" in the last 72  hours. Thyroid function studies No results for input(s): "TSH", "T4TOTAL", "T3FREE", "THYROIDAB" in the last 72 hours.  Invalid input(s): "FREET3" Anemia work up No results for input(s): "VITAMINB12", "FOLATE", "FERRITIN", "TIBC", "IRON", "RETICCTPCT" in the last 72 hours. Urinalysis    Component Value Date/Time   COLORURINE YELLOW 08/09/2021 0021   APPEARANCEUR CLEAR 08/09/2021 0021   LABSPEC 1.013 08/09/2021 0021   PHURINE 5.0 08/09/2021 0021   GLUCOSEU NEGATIVE 08/09/2021 0021   HGBUR NEGATIVE 08/09/2021 0021   BILIRUBINUR NEGATIVE 08/09/2021 0021   KETONESUR NEGATIVE 08/09/2021 0021   PROTEINUR NEGATIVE 08/09/2021 0021   NITRITE NEGATIVE 08/09/2021 0021   LEUKOCYTESUR NEGATIVE 08/09/2021 0021   Sepsis Labs Recent Labs  Lab 02/21/22 0939 02/21/22 1124 02/22/22 0515  WBC 4.9 4.8 4.3   Microbiology No results found for this or any previous visit (from the past 240 hour(s)).   Time coordinating discharge: 32 minutes  SIGNED:   Barb Merino, MD  [facility] 02/22/2022, 10:54 AM

## 2022-02-22 NOTE — Progress Notes (Signed)
Implanted port flushed with heparin flush ( see MAR) then deacessed . Tolerated well. Reviewed AVS with patient , verbalized understanding of all components.

## 2022-02-22 NOTE — Assessment & Plan Note (Signed)
Recommend patient to go to ER for further management.

## 2022-02-22 NOTE — Progress Notes (Signed)
START OFF PATHWAY REGIMEN - Other   [id number]:Capecitabine 825 mg/m2 PO BID D1-5 q7 Days + RT:   A cycle is every 7 days:     Capecitabine   **Always confirm dose/schedule in your pharmacy ordering system**  Patient Characteristics: Intent of Therapy: Curative Intent, Discussed with Patient

## 2022-02-23 ENCOUNTER — Encounter: Payer: Self-pay | Admitting: Oncology

## 2022-02-23 ENCOUNTER — Telehealth: Payer: Self-pay

## 2022-02-23 ENCOUNTER — Other Ambulatory Visit (HOSPITAL_COMMUNITY): Payer: Self-pay

## 2022-02-23 ENCOUNTER — Telehealth: Payer: Self-pay | Admitting: Pharmacist

## 2022-02-23 DIAGNOSIS — C221 Intrahepatic bile duct carcinoma: Secondary | ICD-10-CM

## 2022-02-23 MED ORDER — CAPECITABINE 150 MG PO TABS: 300.0000 mg | ORAL_TABLET | Freq: Two times a day (BID) | ORAL | 0 refills | Status: DC

## 2022-02-23 MED ORDER — CAPECITABINE 500 MG PO TABS: 1500.0000 mg | ORAL_TABLET | Freq: Two times a day (BID) | ORAL | 0 refills | Status: DC

## 2022-02-23 NOTE — Telephone Encounter (Signed)
Oral Oncology Patient Advocate Encounter  After completing a benefits investigation, prior authorization for Capecitabine is not required at this time through [employer]/Part B.  Patient's copay is $0.00.     Berdine Addison, [facility] Patient [place]  [phone] (phone) [phone] (fax) 02/23/2022 9:01 AM

## 2022-02-23 NOTE — Telephone Encounter (Signed)
Oral Oncology Pharmacist Encounter  Received new prescription for Xeloda (capecitabine) for the treatment of cholangiocarcinoma in conjunction with radiation, planned duration until the end of radiation.  BMP from 02/22/22 assessed, no relevant lab abnormalities. Prescription dose and frequency assessed.   Current medication list in Epic reviewed, one DDIs with capecitabine identified: Omeprazole: Proton Pump Inhibitors (PPI) may diminish the therapeutic effect of capecitabine, varying information on the clinical impact. Recommend evaluating the need for a PPI/acid suppression. If acid suppression is needed, attempt switching to a H2 antagonist (eg, famotidine) if possible.  Evaluated chart and no patient barriers to medication adherence identified.   Prescription has been e-scribed to the [facility] for benefits analysis and approval.  Oral Oncology Clinic will continue to follow for insurance authorization, copayment issues, initial counseling and start date.   Darl Pikes, PharmD, BCPS, BCOP, CPP Hematology/Oncology Clinical Pharmacist Practitioner [place]/DB/AP [facility] [phone]  02/23/2022 9:39 AM

## 2022-02-24 ENCOUNTER — Other Ambulatory Visit: Payer: Self-pay | Admitting: Internal Medicine

## 2022-02-24 DIAGNOSIS — R569 Unspecified convulsions: Secondary | ICD-10-CM

## 2022-02-27 ENCOUNTER — Other Ambulatory Visit (HOSPITAL_COMMUNITY): Payer: Self-pay

## 2022-02-27 ENCOUNTER — Inpatient Hospital Stay: Payer: Medicare Other | Attending: Oncology | Admitting: Pharmacist

## 2022-02-27 ENCOUNTER — Ambulatory Visit
Admission: RE | Admit: 2022-02-27 | Discharge: 2022-02-27 | Disposition: A | Discharge status: Home / Self Care | Payer: Medicare Other | Source: Ambulatory Visit | Attending: Radiation Oncology | Admitting: Radiation Oncology

## 2022-02-27 VITALS — BP 121/73 | HR 83 | Temp 97.0°F | Resp 14 | Ht 67.0 in | Wt 241.0 lb

## 2022-02-27 DIAGNOSIS — N1831 Chronic kidney disease, stage 3a: Secondary | ICD-10-CM | POA: Diagnosis not present

## 2022-02-27 DIAGNOSIS — C4359 Malignant melanoma of other part of trunk: Secondary | ICD-10-CM | POA: Diagnosis not present

## 2022-02-27 DIAGNOSIS — Z85820 Personal history of malignant melanoma of skin: Secondary | ICD-10-CM | POA: Insufficient documentation

## 2022-02-27 DIAGNOSIS — I10 Essential (primary) hypertension: Secondary | ICD-10-CM | POA: Diagnosis not present

## 2022-02-27 DIAGNOSIS — D631 Anemia in chronic kidney disease: Secondary | ICD-10-CM | POA: Diagnosis not present

## 2022-02-27 DIAGNOSIS — R799 Abnormal finding of blood chemistry, unspecified: Secondary | ICD-10-CM | POA: Diagnosis not present

## 2022-02-27 DIAGNOSIS — C221 Intrahepatic bile duct carcinoma: Secondary | ICD-10-CM | POA: Insufficient documentation

## 2022-02-27 DIAGNOSIS — I700 Atherosclerosis of aorta: Secondary | ICD-10-CM | POA: Insufficient documentation

## 2022-02-27 DIAGNOSIS — Z510 Encounter for antineoplastic radiation therapy: Secondary | ICD-10-CM | POA: Insufficient documentation

## 2022-02-27 DIAGNOSIS — E559 Vitamin D deficiency, unspecified: Secondary | ICD-10-CM | POA: Diagnosis not present

## 2022-02-27 DIAGNOSIS — Z452 Encounter for adjustment and management of vascular access device: Secondary | ICD-10-CM | POA: Insufficient documentation

## 2022-02-27 DIAGNOSIS — E782 Mixed hyperlipidemia: Secondary | ICD-10-CM | POA: Diagnosis not present

## 2022-02-27 DIAGNOSIS — E875 Hyperkalemia: Secondary | ICD-10-CM | POA: Diagnosis not present

## 2022-02-27 DIAGNOSIS — Z79899 Other long term (current) drug therapy: Secondary | ICD-10-CM | POA: Diagnosis not present

## 2022-02-27 DIAGNOSIS — M109 Gout, unspecified: Secondary | ICD-10-CM | POA: Diagnosis not present

## 2022-02-27 MED ORDER — CAPECITABINE 150 MG PO TABS
300.0000 mg | ORAL_TABLET | Freq: Two times a day (BID) | ORAL | 0 refills | Status: DC
  Filled 2022-02-27: qty 112, 28d supply, fill #0

## 2022-02-27 MED ORDER — CAPECITABINE 500 MG PO TABS
1500.0000 mg | ORAL_TABLET | Freq: Two times a day (BID) | ORAL | 0 refills | Status: DC
  Filled 2022-02-27: qty 168, 28d supply, fill #0

## 2022-02-27 MED ORDER — CAPECITABINE 150 MG PO TABS
300.0000 mg | ORAL_TABLET | Freq: Two times a day (BID) | ORAL | 0 refills | Status: DC
  Filled 2022-02-27 (×2): qty 100, 25d supply, fill #0

## 2022-02-27 MED ORDER — CAPECITABINE 500 MG PO TABS
1500.0000 mg | ORAL_TABLET | Freq: Two times a day (BID) | ORAL | 0 refills | Status: DC
  Filled 2022-02-27 (×2): qty 150, 25d supply, fill #0

## 2022-02-27 NOTE — Progress Notes (Signed)
Radiation Oncology Follow up Note  Name: Edward Pitts   Date:   02/27/2022 MRN:  [id number] DOB: Feb 20, 1961    This 61 y.o. male presents to the clinic today for evaluation of stage I cholangiocarcinoma status post resection and patient previously treated to his left groin for resected large area of metastatic malignant melanoma.  REFERRING PROVIDER: Mechele Claude, FNP  HPI: Patient is a 61 year old male well-known to department having previously been treated back in 21 for metastatic malignant melanoma to his left groin.  He received postoperative radiation therapy to that region with no evidence of recurrence at this time..  This lesion was confirmed on PET CT scan showing hypermetabolic activity suspicious for metastatic disease.  No other evidence of metastatic disease was noted.  This was also confirmed on an MRI scan of his liver showing posterior superior left lobe of liver a lesion measuring 1.9 x 1.8 cm.  Patient went biopsy under ultrasound guidance which was positive for carcinoma.  Patient underwent resection for moderate differentiated cholangiocarcinoma 2.5 cm tumor extending to the hepatic parenchymal margin but of all the margins are negative for tumor.  He has been seen by medical oncology recommendation for concurrent radiation with oral Xeloda.  He is seen today and is doing well.  He specifically denies any abdominal pain or discomfort.  COMPLICATIONS OF TREATMENT: none  FOLLOW UP COMPLIANCE: keeps appointments   PHYSICAL EXAM:  BP 121/73   Pulse 83   Temp (!) 97 F (36.1 C)   Resp 14   Ht '5\' 7"'$  (1.702 m)   Wt 241 lb (109.3 kg)   BMI 37.75 kg/m  Well-developed well-nourished patient in NAD. HEENT reveals PERLA, EOMI, discs not visualized.  Oral cavity is clear. No oral mucosal lesions are identified. Neck is clear without evidence of cervical or supraclavicular adenopathy. Lungs are clear to A&P. Cardiac examination is essentially unremarkable with regular rate and  rhythm without murmur rub or thrill. Abdomen is benign with no organomegaly or masses noted. Motor sensory and DTR levels are equal and symmetric in the upper and lower extremities. Cranial nerves II through XII are grossly intact. Proprioception is intact. No peripheral adenopathy or edema is identified. No motor or sensory levels are noted. Crude visual fields are within normal range.  RADIOLOGY RESULTS: CT scans MRI scans and PET scan all reviewed compatible with above-stated findings  PLAN: This time elect to go ahead with adjuvant radiation therapy.  Long-term studies have shown overall improvement in disease-free survival and overall survival adding radiation therapy.  I would plan on delivering 45 Gray in 25 fractions using IMRT treatment planning and delivery.  I will use PET and MRI fusion studies for treatment planning.  Would also use motion restriction.  Risks and benefits of treatment including possible slight GI toxicity such as diarrhea fatigue skin reaction alteration of blood counts and possible chance of hepatitis localized all were reviewed in detail with the patient.  Patient comprehends my recommendations well.  I have personally set up and ordered CT simulation for early next week.  I would like to take this opportunity to thank you for allowing me to participate in the care of your patient.Noreene Filbert, MD

## 2022-02-27 NOTE — Progress Notes (Signed)
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 He will take capecitabine along with radiation.   Reason for Consult: Capecitabine oral chemotherapy education.   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   Family history of adverse reaction to anesthesia    PONV mother   GERD (gastroesophageal reflux disease)    History of complete heart block    PPM placed   Hyperlipidemia    Hypertension    LBBB (left bundle branch block)    Lymphedema of left leg    uses thigh high compression stockings   Melanoma ([place]) 2012   skin cancer, left thigh   OSA on CPAP    PONV (postoperative nausea and vomiting) 04/16/2019   Port-A-Cath in place    RIGHT chest wall   Presence of cardiac pacemaker    [employer]   Seizures ([place])    still has episodes of dizziness. last event 1 month ago (march 2022) and will pass out. takes clonazepam    HEMATOLOGY/ONCOLOGY HISTORY:  Oncology History  Malignant melanoma of overlapping sites [facility])  04/16/2019 Cancer Staging   Staging form: Melanoma of the Skin, AJCC 8th Edition - Pathologic: Stage Unknown (rpTX, pN1b, cM0) - Signed by [name], MD on 07/27/2020 Stage  prefix: Recurrence    04/23/2019 Initial Diagnosis   Malignant melanoma   -He has a history of left lower extremity melanoma in 2011, status post local excision -04/16/2019 patient underwent left groin mass resection  Resection pathology showed malignant melanoma, replacing a lymph node, with extracapsular extension, peripheral and deep margins involved.  Left inguinal contents, all 7 lymph nodes were negative for melanoma in the lymph nodes. Extranodal melanoma identified in lymphatic and interstitium between nodes -PDL1 80% TPS    07/07/2019 -  Radiation Therapy   status post adjuvant radiation.   07/23/2019 - 07/21/2021 Chemotherapy   Nivolumab q14d      06/29/2020 Imaging   CT chest abdomen pelvis showed stable postoperative appearance of the left groin.  No evidence of local recurrence.  No evidence of metastatic disease in the chest abdomen or pelvis.  Hepatic steatosis.  Stable subcentimeter fluid attenuation lesion of the lateral right lobe of the liver, likely benign cyst or hemangioma.  Coronary artery disease.  Aortic atherosclerosis   10/20/2020 Imaging   CT chest abdomen pelvis showed stable postoperative/radiation appearance of the left groin.  No evidence of local recurrence/metastatic disease within the chest abdomen/pelvis.  Fatty liver disease.  Diverticulosis without evidence of typhlitis.  Aortic atherosclerosis   03/10/2021 Imaging   MRI brain without contrast showed no definitive evidence of intracranial metastatic disease.  Study is limited by  absence of intravenous contrast.     04/26/2021 Imaging   CT chest abdomen pelvis without contrast showed stable post operative changes of left groin with no evidence of recurrent disease.  No evidence of metastatic disease in the chest abdomen pelvis.  Aortic atherosclerosis   08/08/2021 - 08/16/2021 Hospital Admission    patient was hospitalized due to NSTEMI status post CABG x3.  He also had pacemaker The echocardiogram showed  left ventricular ejection fraction of 65 to 70%,   09/15/2021 Imaging   CT chest abdomen pelvis w contrast  IMPRESSION: 1. Subtle hypodense 9 mm lesion in the left lobe of the liver is new from prior imaging including previous contrasted CT dating back to December 10, 2019, with the lesion appearing to equilibrate with background liver on delayed imaging sequence but is incompletely evaluated on this imaging study and technically nonspecific possibly reflecting a benign perfusional variant and while its appearance is not typical for that of a melanoma metastasis, it is not excluded on this examination. Suggest more definitive characterization by hepatic protocol MRI with and without contrast. 2. Stable postoperative changes in the left groin without evidence of local recurrent disease.3. No evidence of metastatic disease in the chest or pelvis.4.  Aortic Atherosclerosis (ICD10-I70.0).      09/23/2021 Imaging   Contrast-enhanced liver ultrasound Mildly hypoenhancing 2.2 cm mass in the posterior aspect of the left lobe of the liver with washout characteristics concerning for non hepatocellular malignancy, concerning for melanotic metastasis given history. The lesion is in an unfavorable location for percutaneous biopsy. Consider PET-CT for further characterization   10/21/2021 -  Chemotherapy   Nivolumab q14d      11/10/2021 Imaging   MRI Brain w wo contrast Negative for metastatic disease to the brain   11/10/2021 Imaging   MRI abdomen w wo contrast 1. Lesion of the posterior superior left lobe of the liver, hepatic segment II, measuring 1.9 x 1.8 cm corresponding to findings of prior imaging. Evaluation is somewhat limited breath motion artifact however there is subtle associated rim enhancement of this lesion. Findings are most in keeping with a hepatic metastasis in the setting of known recurrent melanoma. 2. Mild hepatic steatosis. 3. Cardiomegaly.    02/27/2022 -  Chemotherapy   Patient is  on Treatment Plan : Capecitabine (825 mg/m2 bid) + XRT     Cholangiocarcinoma ([place])  12/19/2021 Initial Diagnosis   Liver biopsy showed carcinoma  -The slides on the patient's prior left inguinal lymph node biopsy ([id number]) with metastatic melanoma were reviewed in conjunction with  this case. The morphology of the malignant cells between the two cases are dissimilar. A limited panel of immunohistochemical stains was performed and the neoplastic cells are positive for superpancytokeratin, cytokeratin 7 (diffuse, strong), cytokeratin 20 (patchy, moderate) and negative for S100, SOX-10, and HepPar-1.These findings are consistent with carcinoma. The morphologic findings and pattern of immunohistochemical staining are non-specific and possible sites of origin include but are not limited to pancreaticobiliary tract, GI tract, prostate, kidney, lung, and breast. Per [facility], the patient had a PET scan performed in July 2023 which demonstrated a single hepatic  hypermetabolic lesion without other sites of disease.    01/19/2022 Surgery   Liver lesion resection at [facility] by Dr.Zani  A.  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  Tumor extends to hepatic parenchymal margin, but all other margins are negative for tumor. See synoptic report and comment.   Background liver  with: Mild steatosis (20%).  No definitive evidence of steatohepatitis.   Mild periportal fibrosis (trichrome and reticulin). No stainable iron (Prussian blue stain).  No evidence to support alpha-1-antitrypsin deficiency on PAS-D stain.   pT1a pNx   01/25/2022 Cancer Staging   Staging form: Intrahepatic Bile Duct, AJCC 8th Edition - Pathologic stage from 01/25/2022: Stage Unknown (pT1a, pNX, cM0) - Signed by [name], MD on 02/21/2022 Stage prefix: Initial diagnosis     ALLERGIES:  is allergic to ibuprofen, levetiracetam, and nsaids.  MEDICATIONS:  Current  Outpatient Medications  Medication Sig Dispense Refill   apixaban (ELIQUIS) 5 MG TABS tablet Take 5 mg by mouth 2 (two) times daily.     aspirin 81 MG EC tablet Take 81 mg by mouth daily.     atorvastatin (LIPITOR) 20 MG tablet Take 20 mg by mouth daily.     capecitabine (XELODA) 150 MG tablet Take 2 tablets (300 mg total) by mouth 2 (two) times daily after a meal. Take Monday-Friday. Take only on days of radiation. Take along with 515m tablets. 100 tablet 0   capecitabine (XELODA) 500 MG tablet Take 3 tablets (1,500 mg total) by mouth 2 (two) times daily after a meal. Take Monday-Friday. Take only on days of radiation. Take along with 154mtablets. 150 tablet 0   Carboxymeth-Glyc-Polysorb PF (REFRESH OPTIVE MEGA-3) 0.5-1-0.5 % SOLN Place 1 drop into both eyes daily as needed (dry eyes).     clonazePAM (KLONOPIN) 0.5 MG tablet TAKE 1 TABLET BY MOUTH IN THE MORNING AND 2 TABS BY MOUTH IN THE EVENING 90 tablet 0   febuxostat (ULORIC) 40 MG tablet Take 40 mg by mouth daily.     ferrous sulfate 325 (65 FE) MG EC tablet TAKE 1 TABLET BY MOUTH 2 TIMES DAILY WITH A MEAL. 180 tablet 1   hydrochlorothiazide (HYDRODIURIL) 25 MG tablet Take 1 tablet (25 mg total) by mouth daily. 30 tablet 0   hydrocortisone 2.5 % ointment Apply 1 application. topically daily as needed (itching).     Lacosamide 100 MG TABS TAKE 1 TABLET (100 MG TOTAL) BY MOUTH IN THE MORNING AND AT BEDTIME. 60 tablet 3   lidocaine-prilocaine (EMLA) cream Apply 1 application. topically as needed. Apply small amount of cream to port site approx 1-2 hours prior to appointment. 30 g 11   metoprolol succinate (TOPROL-XL) 25 MG 24 hr tablet Take 25 mg by mouth daily.     Multiple Vitamin (MULTIVITAMIN WITH MINERALS) TABS tablet Take 1 tablet by mouth daily. Centrum Silver     omeprazole (PRILOSEC) 20 MG capsule TAKE 1 CAPSULE BY MOUTH EVERY DAY 90 capsule 0   ondansetron (ZOFRAN) 4 MG tablet TAKE 1 TABLET BY MOUTH EVERY 8 HOURS AS NEEDED FOR  NAUSEA AND VOMITING 90 tablet 1   No current facility-administered medications for this visit.   Facility-Administered Medications Ordered in Other Visits  Medication Dose Route Frequency Provider Last Rate Last Admin   heparin lock flush 100 UNIT/ML injection            heparin lock flush 100 UNIT/ML injection             VITAL SIGNS: There were no vitals taken for this visit. There were no vitals filed for this visit.  Estimated body mass index is 37.75 kg/m as calculated from the following:   Height as of an earlier encounter on 02/27/22: 5' 7" (1.702 m).   Weight as of an earlier encounter on 02/27/22: 109.3 kg (241  lb).  LABS: CBC:    Component Value Date/Time   WBC 4.3 02/22/2022 0515   HGB 10.4 (L) 02/22/2022 0515   HCT 32.0 (L) 02/22/2022 0515   PLT 137 (L) 02/22/2022 0515   MCV 89.1 02/22/2022 0515   NEUTROABS 2.9 02/21/2022 1124   LYMPHSABS 1.2 02/21/2022 1124   MONOABS 0.5 02/21/2022 1124   EOSABS 0.2 02/21/2022 1124   BASOSABS 0.0 02/21/2022 1124   Comprehensive Metabolic Panel:    Component Value Date/Time   NA 140 02/22/2022 0515   K 5.1 02/22/2022 0515   CL 115 (H) 02/22/2022 0515   CO2 20 (L) 02/22/2022 0515   BUN 37 (H) 02/22/2022 0515   CREATININE 1.37 (H) 02/22/2022 0515   GLUCOSE 98 02/22/2022 0515   CALCIUM 9.2 02/22/2022 0515   AST 27 02/21/2022 0939   ALT 44 02/21/2022 0939   ALKPHOS 74 02/21/2022 0939   BILITOT 0.4 02/21/2022 0939   PROT 7.6 02/21/2022 0939   ALBUMIN 4.2 02/21/2022 0939     Present during today's visit: patient only  Start plan: Patient will start his capecitabine on the first day of radiation   Patient Education I spoke with patient for overview of new oral chemotherapy medication: capecitabine   Administration: Counseled patient on administration, dosing, side effects, monitoring, drug-food interactions, safe handling, storage, and disposal. Patient will take three 570m and two 1568mtablets by mouth 2 (two) times  daily after a meal. Take Monday-Friday. Take only on days of radiation.   Side Effects: Side effects include but not limited to: diarrhea, hand-foot syndrome, mouth sores, edema, decreased wbc, fatigue, N/V Diarrhea: patient knows to use loperamide as needed, and report if he is having 4 or more loose stools Hand-foot syndrome: Provided patient with sample bottle of [place] Smooth Extra Care 20, he to report any skin changes Mouth sores: Patient knows to request magic mouthwash if needed    Drug-drug Interactions (DDI): Patient is going to attempt to stop his omeprazole  Adherence: After discussion with patient no patient barriers to medication adherence identified.  Reviewed with patient importance of keeping a medication schedule and plan for any missed doses.  Mr. BaBunchoiced understanding and appreciation. All questions answered. Medication handout provided.  Provided patient with [facility]. Patient knows to call the office with questions or concerns. Oral [facility]otherapy [facility] will continue to follow.  Patient expressed understanding and was in agreement with this plan. He also understands that He can call clinic at any time with any questions, concerns, or complaints.   [facility] Issues: [facility]  Follow-up plan: RTC per radonc schedule  Thank you for allowing me to participate in the care of this patient.   Time Total: 30 min  Visit consisted of counseling and education on dealing with issues of symptom management in the setting of serious and potentially life-threatening illness.Greater than 50%  of this time was spent counseling and coordinating care related to the above assessment and plan.  Signed by: AlDarl PikesPharmD, BCPS, BCSalley SlaughterCPP Hematology/Oncology Clinical Pharmacist Practitioner [place]/DB/AP [phone]/06/2021 3:39 PM

## 2022-02-28 ENCOUNTER — Other Ambulatory Visit (HOSPITAL_COMMUNITY): Payer: Self-pay

## 2022-02-28 ENCOUNTER — Telehealth: Payer: Self-pay

## 2022-02-28 DIAGNOSIS — C221 Intrahepatic bile duct carcinoma: Secondary | ICD-10-CM

## 2022-02-28 NOTE — Telephone Encounter (Signed)
Tentative radiation treatment start date is 12/20 or 12/21.   Please schedule pt for lab/MD around 12/20 and inform pt of appt

## 2022-03-01 ENCOUNTER — Other Ambulatory Visit (HOSPITAL_COMMUNITY): Payer: Self-pay

## 2022-03-02 ENCOUNTER — Inpatient Hospital Stay: Payer: Medicare Other

## 2022-03-02 DIAGNOSIS — Z452 Encounter for adjustment and management of vascular access device: Secondary | ICD-10-CM | POA: Diagnosis not present

## 2022-03-02 DIAGNOSIS — D631 Anemia in chronic kidney disease: Secondary | ICD-10-CM | POA: Diagnosis not present

## 2022-03-02 DIAGNOSIS — Z79899 Other long term (current) drug therapy: Secondary | ICD-10-CM | POA: Diagnosis not present

## 2022-03-02 DIAGNOSIS — Z85820 Personal history of malignant melanoma of skin: Secondary | ICD-10-CM | POA: Diagnosis not present

## 2022-03-02 DIAGNOSIS — N1831 Chronic kidney disease, stage 3a: Secondary | ICD-10-CM | POA: Diagnosis not present

## 2022-03-02 DIAGNOSIS — Z95828 Presence of other vascular implants and grafts: Secondary | ICD-10-CM

## 2022-03-02 DIAGNOSIS — I700 Atherosclerosis of aorta: Secondary | ICD-10-CM | POA: Diagnosis not present

## 2022-03-02 DIAGNOSIS — C4359 Malignant melanoma of other part of trunk: Secondary | ICD-10-CM | POA: Diagnosis not present

## 2022-03-02 DIAGNOSIS — C221 Intrahepatic bile duct carcinoma: Secondary | ICD-10-CM | POA: Diagnosis not present

## 2022-03-02 DIAGNOSIS — Z510 Encounter for antineoplastic radiation therapy: Secondary | ICD-10-CM | POA: Diagnosis not present

## 2022-03-02 MED ORDER — HEPARIN SOD (PORK) LOCK FLUSH 100 UNIT/ML IV SOLN
500.0000 [IU] | Freq: Once | INTRAVENOUS | Status: AC
  Administered 2022-03-02: 500 [IU] via INTRAVENOUS
  Filled 2022-03-02: qty 5

## 2022-03-02 MED ORDER — SODIUM CHLORIDE 0.9% FLUSH
10.0000 mL | Freq: Once | INTRAVENOUS | Status: AC
  Administered 2022-03-02: 10 mL via INTRAVENOUS
  Filled 2022-03-02: qty 10

## 2022-03-08 ENCOUNTER — Ambulatory Visit: Payer: Medicare Other

## 2022-03-08 DIAGNOSIS — C221 Intrahepatic bile duct carcinoma: Secondary | ICD-10-CM | POA: Diagnosis not present

## 2022-03-13 ENCOUNTER — Ambulatory Visit
Admission: RE | Admit: 2022-03-13 | Discharge: 2022-03-13 | Disposition: A | Discharge status: Home / Self Care | Payer: Medicare Other | Source: Ambulatory Visit | Attending: Radiation Oncology | Admitting: Radiation Oncology

## 2022-03-13 DIAGNOSIS — D631 Anemia in chronic kidney disease: Secondary | ICD-10-CM | POA: Diagnosis not present

## 2022-03-13 DIAGNOSIS — Z452 Encounter for adjustment and management of vascular access device: Secondary | ICD-10-CM | POA: Diagnosis not present

## 2022-03-13 DIAGNOSIS — I700 Atherosclerosis of aorta: Secondary | ICD-10-CM | POA: Diagnosis not present

## 2022-03-13 DIAGNOSIS — Z85820 Personal history of malignant melanoma of skin: Secondary | ICD-10-CM | POA: Diagnosis not present

## 2022-03-13 DIAGNOSIS — C221 Intrahepatic bile duct carcinoma: Secondary | ICD-10-CM | POA: Diagnosis not present

## 2022-03-13 DIAGNOSIS — Z79899 Other long term (current) drug therapy: Secondary | ICD-10-CM | POA: Diagnosis not present

## 2022-03-13 DIAGNOSIS — N1831 Chronic kidney disease, stage 3a: Secondary | ICD-10-CM | POA: Diagnosis not present

## 2022-03-13 DIAGNOSIS — Z510 Encounter for antineoplastic radiation therapy: Secondary | ICD-10-CM | POA: Diagnosis not present

## 2022-03-13 DIAGNOSIS — C4359 Malignant melanoma of other part of trunk: Secondary | ICD-10-CM | POA: Diagnosis not present

## 2022-03-14 ENCOUNTER — Other Ambulatory Visit: Payer: Self-pay | Admitting: Internal Medicine

## 2022-03-14 MED ORDER — CLONAZEPAM 0.5 MG PO TABS: ORAL_TABLET | ORAL | 0 refills | Status: DC

## 2022-03-17 ENCOUNTER — Inpatient Hospital Stay (HOSPITAL_BASED_OUTPATIENT_CLINIC_OR_DEPARTMENT_OTHER): Payer: Medicare Other | Admitting: Oncology

## 2022-03-17 ENCOUNTER — Inpatient Hospital Stay: Payer: Medicare Other

## 2022-03-17 ENCOUNTER — Encounter: Payer: Self-pay | Admitting: Oncology

## 2022-03-17 VITALS — BP 140/70 | HR 65 | Temp 99.5°F | Resp 18 | Wt 243.9 lb

## 2022-03-17 DIAGNOSIS — C221 Intrahepatic bile duct carcinoma: Secondary | ICD-10-CM

## 2022-03-17 DIAGNOSIS — C4359 Malignant melanoma of other part of trunk: Secondary | ICD-10-CM | POA: Diagnosis not present

## 2022-03-17 DIAGNOSIS — D631 Anemia in chronic kidney disease: Secondary | ICD-10-CM

## 2022-03-17 DIAGNOSIS — I700 Atherosclerosis of aorta: Secondary | ICD-10-CM | POA: Diagnosis not present

## 2022-03-17 DIAGNOSIS — N1832 Chronic kidney disease, stage 3b: Secondary | ICD-10-CM

## 2022-03-17 DIAGNOSIS — Z5111 Encounter for antineoplastic chemotherapy: Secondary | ICD-10-CM | POA: Diagnosis not present

## 2022-03-17 DIAGNOSIS — Z452 Encounter for adjustment and management of vascular access device: Secondary | ICD-10-CM | POA: Diagnosis not present

## 2022-03-17 DIAGNOSIS — N1831 Chronic kidney disease, stage 3a: Secondary | ICD-10-CM

## 2022-03-17 DIAGNOSIS — C438 Malignant melanoma of overlapping sites of skin: Secondary | ICD-10-CM | POA: Diagnosis not present

## 2022-03-17 DIAGNOSIS — Z510 Encounter for antineoplastic radiation therapy: Secondary | ICD-10-CM | POA: Diagnosis not present

## 2022-03-17 DIAGNOSIS — Z79899 Other long term (current) drug therapy: Secondary | ICD-10-CM | POA: Diagnosis not present

## 2022-03-17 DIAGNOSIS — Z85820 Personal history of malignant melanoma of skin: Secondary | ICD-10-CM | POA: Diagnosis not present

## 2022-03-17 LAB — CBC WITH DIFFERENTIAL/PLATELET
Abs Immature Granulocytes: 0.03 10*3/uL (ref 0.00–0.07)
Basophils Absolute: 0 10*3/uL (ref 0.0–0.1)
Basophils Relative: 1 %
Eosinophils Absolute: 0.1 10*3/uL (ref 0.0–0.5)
Eosinophils Relative: 2 %
HCT: 38.4 % — ABNORMAL LOW (ref 39.0–52.0)
Hemoglobin: 12.7 g/dL — ABNORMAL LOW (ref 13.0–17.0)
Immature Granulocytes: 1 %
Lymphocytes Relative: 26 %
Lymphs Abs: 1.3 10*3/uL (ref 0.7–4.0)
MCH: 30.1 pg (ref 26.0–34.0)
MCHC: 33.1 g/dL (ref 30.0–36.0)
MCV: 91 fL (ref 80.0–100.0)
Monocytes Absolute: 0.5 10*3/uL (ref 0.1–1.0)
Monocytes Relative: 11 %
Neutro Abs: 3 10*3/uL (ref 1.7–7.7)
Neutrophils Relative %: 59 %
Platelets: 218 10*3/uL (ref 150–400)
RBC: 4.22 MIL/uL (ref 4.22–5.81)
RDW: 13.2 % (ref 11.5–15.5)
WBC: 5 10*3/uL (ref 4.0–10.5)
nRBC: 0 % (ref 0.0–0.2)

## 2022-03-17 LAB — COMPREHENSIVE METABOLIC PANEL
ALT: 47 U/L — ABNORMAL HIGH (ref 0–44)
AST: 30 U/L (ref 15–41)
Albumin: 4.3 g/dL (ref 3.5–5.0)
Alkaline Phosphatase: 77 U/L (ref 38–126)
Anion gap: 7 (ref 5–15)
BUN: 27 mg/dL — ABNORMAL HIGH (ref 8–23)
CO2: 27 mmol/L (ref 22–32)
Calcium: 9.3 mg/dL (ref 8.9–10.3)
Chloride: 105 mmol/L (ref 98–111)
Creatinine, Ser: 1.58 mg/dL — ABNORMAL HIGH (ref 0.61–1.24)
GFR, Estimated: 49 mL/min — ABNORMAL LOW (ref >60)
Glucose, Bld: 104 mg/dL — ABNORMAL HIGH (ref 70–99)
Potassium: 4.6 mmol/L (ref 3.5–5.1)
Sodium: 139 mmol/L (ref 135–145)
Total Bilirubin: 0.6 mg/dL (ref 0.3–1.2)
Total Protein: 7.4 g/dL (ref 6.5–8.1)

## 2022-03-17 MED ORDER — TRAZODONE HCL 50 MG PO TABS: 50.0000 mg | ORAL_TABLET | Freq: Every evening | ORAL | 0 refills | Status: DC | PRN

## 2022-03-18 LAB — CANCER ANTIGEN 19-9: CA 19-9: 4 U/mL (ref 0–35)

## 2022-03-19 ENCOUNTER — Encounter: Payer: Self-pay | Admitting: Oncology

## 2022-03-19 DIAGNOSIS — Z5111 Encounter for antineoplastic chemotherapy: Secondary | ICD-10-CM | POA: Insufficient documentation

## 2022-03-19 NOTE — Assessment & Plan Note (Addendum)
avoid nephrotoxins.   creatinine fluctuates. Encourage oral hydration.

## 2022-03-19 NOTE — Assessment & Plan Note (Signed)
Chemotherapy plan as listed above 

## 2022-03-19 NOTE — Assessment & Plan Note (Signed)
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  Labs are reviewed and discussed with patient. Proceed with concurrent chemotherapy Xeloda '825mg'$ /m2 BID on his radiation days.  After concurrent chemo RT plan additional 4 months of Xeloda [1000 mg/m2 BID for 14 of every 21 days]  He agrees with the plan.

## 2022-03-19 NOTE — Progress Notes (Signed)
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  Labs are reviewed and discussed with patient. Proceed with concurrent chemotherapy Xeloda 854m/m2 BID on his radiation days.  After concurrent chemo RT plan additional 4 months of Xeloda [1000 mg/m2 BID for 14 of every 21 days]  He agrees with the plan.   Encounter for antineoplastic chemotherapy Chemotherapy plan as listed above.   Anemia in chronic kidney disease Continue monitor.   Malignant melanoma of overlapping sites ([facility] Patient has no radiographic evidence of metastatic melanoma. Off immunotherapy. Continue surveillance  Stage 3a chronic kidney disease ([place] avoid nephrotoxins.   creatinine fluctuates. Encourage oral hydration.      Orders Placed This Encounter  Procedures   Basic metabolic panel    Standing Status:   Future    Standing Expiration Date:   03/17/2023   CBC with Differential/Platelet    Standing Status:   Future    Standing Expiration Date:   03/17/2023   Comprehensive metabolic panel    Standing Status:   Future    Standing  Expiration Date:   03/17/2023    Follow-up in 2 weeks.  All questions were answered. The patient knows to call the clinic with any problems, questions or concerns.  ZEarlie Server MD, PhD [facility] Hematology Oncology 03/17/2022   CHIEF COMPLAINTS/REASON FOR VISIT:  Follow up for melanoma HISTORY OF PRESENTING ILLNESS:   SOsiris Odriscollis a  61y.o.  male presents for recurrent malignant melanoma.   Oncology History  Malignant melanoma of overlapping sites ([facility]  04/16/2019 Cancer Staging   Staging form: Melanoma of the Skin, AJCC 8th Edition - Pathologic: Stage Unknown (rpTX, pN1b, cM0) - Signed by YEarlie Server MD on 07/27/2020 Stage prefix: Recurrence    04/23/2019 Initial Diagnosis   Malignant melanoma   -He has a history of left lower extremity melanoma in 2011, status post local excision -04/16/2019 patient underwent left groin mass resection  Resection pathology showed malignant melanoma, replacing a lymph node, with extracapsular extension, peripheral and deep margins involved.  Left inguinal contents, all 7 lymph nodes were negative for melanoma in the lymph nodes. Extranodal melanoma identified in lymphatic and interstitium between nodes -PDL1 80% TPS    07/07/2019 -  Radiation Therapy   status post adjuvant radiation.   07/23/2019 - 07/21/2021 Chemotherapy   Nivolumab q14d      06/29/2020 Imaging   CT chest abdomen pelvis showed stable postoperative appearance of the left groin.  No evidence of local recurrence.  No evidence of metastatic disease in the chest abdomen or pelvis.  Hepatic steatosis.  Stable subcentimeter fluid attenuation lesion of the lateral right lobe of the liver, likely benign cyst or hemangioma.  Coronary artery  disease.  Aortic atherosclerosis   10/20/2020 Imaging   CT chest abdomen pelvis showed stable postoperative/radiation appearance of the left groin.  No evidence of local recurrence/metastatic disease within the chest abdomen/pelvis.  Fatty liver disease.   Diverticulosis without evidence of typhlitis.  Aortic atherosclerosis   03/10/2021 Imaging   MRI brain without contrast showed no definitive evidence of intracranial metastatic disease.  Study is limited by absence of intravenous contrast.     04/26/2021 Imaging   CT chest abdomen pelvis without contrast showed stable post operative changes of left groin with no evidence of recurrent disease.  No evidence of metastatic disease in the chest abdomen pelvis.  Aortic atherosclerosis   08/08/2021 - 08/16/2021 Hospital Admission    patient was hospitalized due to NSTEMI status post CABG x3.  He also had pacemaker The echocardiogram showed left ventricular ejection fraction of 65 to 70%,   09/15/2021 Imaging   CT chest abdomen pelvis w contrast  IMPRESSION: 1. Subtle hypodense 9 mm lesion in the left lobe of the liver is new from prior imaging including previous contrasted CT dating back to December 10, 2019, with the lesion appearing to equilibrate with background liver on delayed imaging sequence but is incompletely evaluated on this imaging study and technically nonspecific possibly reflecting a benign perfusional variant and while its appearance is not typical for that of a melanoma metastasis, it is not excluded on this examination. Suggest more definitive characterization by hepatic protocol MRI with and without contrast. 2. Stable postoperative changes in the left groin without evidence of local recurrent disease.3. No evidence of metastatic disease in the chest or pelvis.4.  Aortic Atherosclerosis (ICD10-I70.0).      09/23/2021 Imaging   Contrast-enhanced liver ultrasound Mildly hypoenhancing 2.2 cm mass in the posterior aspect of the left lobe of the liver with washout characteristics concerning for non hepatocellular malignancy, concerning for melanotic metastasis given history. The lesion is in an unfavorable location for percutaneous biopsy. Consider PET-CT for further characterization    10/21/2021 -  Chemotherapy   Nivolumab q14d      11/10/2021 Imaging   MRI Brain w wo contrast Negative for metastatic disease to the brain   11/10/2021 Imaging   MRI abdomen w wo contrast 1. Lesion of the posterior superior left lobe of the liver, hepatic segment II, measuring 1.9 x 1.8 cm corresponding to findings of prior imaging. Evaluation is somewhat limited breath motion artifact however there is subtle associated rim enhancement of this lesion. Findings are most in keeping with a hepatic metastasis in the setting of known recurrent melanoma. 2. Mild hepatic steatosis. 3. Cardiomegaly.    02/27/2022 -  Chemotherapy   Patient is on Treatment Plan : Capecitabine (825 mg/m2 bid) + XRT     Cholangiocarcinoma ([place])  12/19/2021 Initial Diagnosis   Liver biopsy showed carcinoma  -The slides on the patient's prior left inguinal lymph node biopsy ([id number]) with metastatic melanoma were reviewed in conjunction with  this case. The morphology of the malignant cells between the two cases are dissimilar. A limited panel of immunohistochemical stains was performed and the neoplastic cells are positive for superpancytokeratin, cytokeratin 7 (diffuse, strong), cytokeratin 20 (patchy, moderate) and negative for S100, SOX-10, and HepPar-1.These findings are consistent with carcinoma. The morphologic findings and pattern of immunohistochemical staining are non-specific and possible sites of origin include but are not limited to pancreaticobiliary tract, GI tract, prostate, kidney, lung, and breast. Per [facility], the patient had a PET scan performed in July 2023 which  demonstrated a single hepatic  hypermetabolic lesion without other sites of disease.    01/19/2022 Surgery   Liver lesion resection at [facility] by Dr.Zani  A.  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  Tumor extends to hepatic parenchymal margin, but all other margins are  negative for tumor. See synoptic report and comment.   Background liver with: Mild steatosis (20%).  No definitive evidence of steatohepatitis.   Mild periportal fibrosis (trichrome and reticulin). No stainable iron (Prussian blue stain).  No evidence to support alpha-1-antitrypsin deficiency on PAS-D stain.   pT1a pNx   01/25/2022 Cancer Staging   Staging form: Intrahepatic Bile Duct, AJCC 8th Edition - Pathologic stage from 01/25/2022: Stage Unknown (pT1a, pNX, cM0) - Signed by [name], MD on 02/21/2022 Stage prefix: Initial diagnosis    04/18/2021, patient establish care with neurology Dr. Mickeal Skinner for intermittent altered cognition.  he was recommended to start Vimpat.    INTERVAL HISTORY Tilford Deaton is a 60 y.o. male who has above history reviewed by me today presents for follow up visit for management of inguinal nodal recurrence of melanoma, resected T1a cholangiocarcinoma.  He has established care with radiation oncology.  There was plan to start chemotherapy radiation this week, however he missed appt of simulation on 03/08/22 due to schedule conflicts.  He is scheduled to start radiation next week.    :Review of Systems  Constitutional:  Negative for appetite change, chills, fatigue, fever and unexpected weight change.  HENT:   Negative for hearing loss and voice change.   Eyes:  Negative for eye problems and icterus.  Respiratory:  Negative for chest tightness, cough and shortness of breath.   Cardiovascular:  Positive for leg swelling. Negative for chest pain.  Gastrointestinal:  Negative for abdominal distention and abdominal pain.  Endocrine: Negative for hot flashes.  Genitourinary:  Negative for difficulty urinating, dysuria and frequency.   Musculoskeletal:        Status post left hip replacement  Skin:  Negative for itching and rash.       Skin hypo-pigmentation on upper extremities, no change  Neurological:  Negative for light-headedness and numbness.        Spells  Hematological:  Negative for adenopathy. Does not bruise/bleed easily.  Psychiatric/Behavioral:  Negative for confusion.     MEDICAL HISTORY:  Past Medical History:  Diagnosis Date   Anemia    iron treatments   Anxiety    Aortic atherosclerosis (H[facility])    Arthritis    Cancer of groin ([place]) 2021   left groin, resected, radiation   Cataract    Complication of anesthesia    PONV   Coronary artery disease    Dizziness of unknown etiology    has led to seizures and passing out.   Family history of adverse reaction to anesthesia    PONV mother   GERD (gastroesophageal reflux disease)    History of complete heart block    PPM placed   Hyperlipidemia    Hypertension    LBBB (left bundle branch block)    Lymphedema of left leg    uses thigh high compression stockings   Melanoma ([place]) 2012   skin cancer, left thigh   OSA on CPAP    PONV (postoperative nausea and vomiting) 04/16/2019   Port-A-Cath in place    RIGHT chest wall   Presence of cardiac pacemaker    [employer]   Seizures ([place])  still has episodes of dizziness. last event 1 month ago (march 2022) and will pass out. takes clonazepam    SURGICAL HISTORY: Past Surgical History:  Procedure Laterality Date   CORONARY ARTERY BYPASS GRAFT N/A 08/10/2021   Procedure: CORONARY ARTERY BYPASS GRAFTING (CABG) X 3 USING LEFT INTERNAL MAMMARY ARTERY AND RIGHT GREATER SAPHENOUS VEIN;  Surgeon: Dahlia Byes, MD;  Location: [place];  Service: Open Heart Surgery;  Laterality: N/A;   CT RADIATION THERAPY GUIDE     left groin   dental implant     permanent implant   ENDOVEIN HARVEST OF GREATER SAPHENOUS VEIN Right 08/10/2021   Procedure: ENDOVEIN HARVEST OF GREATER SAPHENOUS VEIN;  Surgeon: Dahlia Byes, MD;  Location: [place];  Service: Open Heart Surgery;  Laterality: Right;   KNEE SURGERY Left    arthroscopy   LEFT HEART CATH AND CORONARY ANGIOGRAPHY Left 06/29/2017   Procedure: LEFT HEART CATH AND CORONARY ANGIOGRAPHY;   Surgeon: [name], MD;  Location: [place] [facility];  Service: Cardiovascular;  Laterality: Left;   LEFT HEART CATH AND CORONARY ANGIOGRAPHY N/A 08/08/2021   Procedure: LEFT HEART CATH AND CORONARY ANGIOGRAPHY;  Surgeon: [name], MD;  Location: [place] [facility];  Service: Cardiovascular;  Laterality: N/A;   LYMPH NODE DISSECTION Left 04/16/2019   Procedure: Left inguinal Lymph Node Dissection;  Surgeon: [name], MD;  Location: [place];  Service: General;  Laterality: Left;   MELANOMA EXCISION Left 04/16/2019   Procedure: MELANOMA EXCISION LEFT GROIN MASS;  Surgeon: [name], MD;  Location: [place];  Service: General;  Laterality: Left;   MELANOMA EXCISION WITH SENTINEL LYMPH NODE BIOPSY Left 2012   Left calf    PACEMAKER INSERTION N/A 08/26/2018   Procedure: INSERTION PACEMAKER;  Surgeon: [name], MD;  Location: [facility];  Service: Cardiovascular;  Laterality: N/A;   PORTA CATH INSERTION N/A 08/26/2019   Procedure: PORTA CATH INSERTION;  Surgeon: Katha Cabal, MD;  Location: [place] [facility];  Service: Cardiovascular;  Laterality: N/A;   SUPERFICIAL LYMPH NODE BIOPSY / EXCISION Left 2020   lymph nodes removed around left groin melanoma site   TEE WITHOUT CARDIOVERSION N/A 08/10/2021   Procedure: TRANSESOPHAGEAL ECHOCARDIOGRAM (TEE);  Surgeon: Dahlia Byes, MD;  Location: [place];  Service: Open Heart Surgery;  Laterality: N/A;   TEMPORARY PACEMAKER N/A 08/25/2018   Procedure: TEMPORARY PACEMAKER;  Surgeon: [name], MD;  Location: [place] [facility];  Service: Cardiovascular;  Laterality: N/A;   TOTAL HIP ARTHROPLASTY Left 07/14/2020   Procedure: TOTAL HIP ARTHROPLASTY;  Surgeon: Dereck Leep, MD;  Location: [facility];  Service: Orthopedics;  Laterality: Left;    SOCIAL HISTORY: Social History   Socioeconomic History   Marital status: Married    Spouse name: Vicente Males    Number of children: 7   Years of education: 12   Highest  education level: Not on file  Occupational History    Comment: disability  Tobacco Use   Smoking status: Never   Smokeless tobacco: Never  Vaping Use   Vaping Use: Never used  Substance and Sexual Activity   Alcohol use: No   Drug use: No   Sexual activity: Not Currently  Other Topics Concern   Not on file  Social History Narrative   Lives with  Wife,   Has 2 small dogs   Caffeine use: sodas (2 per day)      Out of work on disability.  Has a walk in shower.  No stairs to climb   Oncology treatment ongoing. Uses port a cath for treatment.      pacemaker   Social Determinants of Health   Financial Resource Strain: Low Risk  (02/12/2019)   Overall Financial Resource Strain (CARDIA)    Difficulty of Paying Living Expenses: Not hard at all  Food Insecurity: Food Insecurity Present (02/21/2022)   Hunger Vital Sign    Worried About Running Out of Food in the Last Year: Sometimes true    Ran Out of Food in the Last Year: Often true  Transportation Needs: No Transportation Needs (02/21/2022)   PRAPARE - Hydrologist (Medical): No    Lack of Transportation (Non-Medical): No  Physical Activity: Unknown (02/12/2019)   Exercise Vital Sign    Days of Exercise per Week: 0 days    Minutes of Exercise per Session: Not on file  Stress: No Stress Concern Present (02/12/2019)   [place]    Feeling of Stress : Only a little  Social Connections: Unknown (02/12/2019)   Social Connection and Isolation Panel [NHANES]    Frequency of Communication with Friends and Family: More than three times a week    Frequency of Social Gatherings with Friends and Family: Not on file    Attends Religious Services: Not on file    Active Member of Clubs or Organizations: Not on file    Attends Archivist Meetings: Not on file    Marital Status: Married  Intimate Partner Violence: Not At Risk  (02/21/2022)   Humiliation, Afraid, Rape, and Kick questionnaire    Fear of Current or Ex-Partner: No    Emotionally Abused: No    Physically Abused: No    Sexually Abused: No    FAMILY HISTORY: Family History  Problem Relation Age of Onset   Cancer Paternal Grandmother     ALLERGIES:  is allergic to ibuprofen, levetiracetam, and nsaids.  MEDICATIONS:  Current Outpatient Medications  Medication Sig Dispense Refill   apixaban (ELIQUIS) 5 MG TABS tablet Take 5 mg by mouth 2 (two) times daily.     aspirin 81 MG EC tablet Take 81 mg by mouth daily.     atorvastatin (LIPITOR) 20 MG tablet Take 20 mg by mouth daily.     Carboxymeth-Glyc-Polysorb PF (REFRESH OPTIVE MEGA-3) 0.5-1-0.5 % SOLN Place 1 drop into both eyes daily as needed (dry eyes).     clonazePAM (KLONOPIN) 0.5 MG tablet TAKE 1 TABLET BY MOUTH IN THE MORNING AND 2 TABS BY MOUTH IN THE EVENING 90 tablet 0   febuxostat (ULORIC) 40 MG tablet Take 40 mg by mouth daily.     ferrous sulfate 325 (65 FE) MG EC tablet TAKE 1 TABLET BY MOUTH 2 TIMES DAILY WITH A MEAL. 180 tablet 1   hydrochlorothiazide (HYDRODIURIL) 25 MG tablet Take 1 tablet (25 mg total) by mouth daily. 30 tablet 0   hydrocortisone 2.5 % ointment Apply 1 application. topically daily as needed (itching).     Lacosamide 100 MG TABS TAKE 1 TABLET (100 MG TOTAL) BY MOUTH IN THE MORNING AND AT BEDTIME. 60 tablet 3   lidocaine-prilocaine (EMLA) cream Apply 1 application. topically as needed. Apply small amount of cream to port site approx 1-2 hours prior to appointment. 30 g 11   metoprolol succinate (TOPROL-XL) 25 MG 24 hr tablet Take 25 mg by mouth daily.     Multiple Vitamin (MULTIVITAMIN WITH MINERALS) TABS tablet Take  1 tablet by mouth daily. Centrum Silver     omeprazole (PRILOSEC) 20 MG capsule TAKE 1 CAPSULE BY MOUTH EVERY DAY 90 capsule 0   ondansetron (ZOFRAN) 4 MG tablet TAKE 1 TABLET BY MOUTH EVERY 8 HOURS AS NEEDED FOR NAUSEA AND VOMITING 90 tablet 1    traZODone (DESYREL) 50 MG tablet Take 1 tablet (50 mg total) by mouth at bedtime as needed for sleep. 30 tablet 0   capecitabine (XELODA) 150 MG tablet Take 2 tablets (300 mg total) by mouth 2 (two) times daily after a meal. Take Monday-Friday. Take only on days of radiation. Take along with 517m tablets. (Patient not taking: Reported on 03/17/2022) 100 tablet 0   capecitabine (XELODA) 500 MG tablet Take 3 tablets (1,500 mg total) by mouth 2 (two) times daily after a meal. Take Monday-Friday. Take only on days of radiation. Take along with 155mtablets. (Patient not taking: Reported on 03/17/2022) 150 tablet 0   No current facility-administered medications for this visit.   Facility-Administered Medications Ordered in Other Visits  Medication Dose Route Frequency Provider Last Rate Last Admin   heparin lock flush 100 UNIT/ML injection            heparin lock flush 100 UNIT/ML injection              PHYSICAL EXAMINATION: ECOG PERFORMANCE STATUS: 1 - Symptomatic but completely ambulatory Vitals:   03/17/22 1126  BP: (!) 140/70  Pulse: 65  Resp: 18  Temp: 99.5 F (37.5 C)   Filed Weights   03/17/22 1126  Weight: 243 lb 14.4 oz (110.6 kg)    Physical Exam Constitutional:      General: He is not in acute distress.    Comments: Patient ambulates independently  HENT:     Head: Normocephalic and atraumatic.  Eyes:     General: No scleral icterus.    Pupils: Pupils are equal, round, and reactive to light.  Cardiovascular:     Rate and Rhythm: Normal rate and regular rhythm.     Heart sounds: Normal heart sounds.  Pulmonary:     Effort: Pulmonary effort is normal. No respiratory distress.     Breath sounds: No wheezing.  Abdominal:     General: Bowel sounds are normal. There is no distension.     Palpations: Abdomen is soft. There is no mass.     Tenderness: There is no abdominal tenderness.     Comments:    Musculoskeletal:        General: No deformity. Normal range of  motion.     Cervical back: Normal range of motion and neck supple.     Comments: Left lower extremity edema-chronic   Skin:    General: Skin is warm and dry.  Neurological:     Mental Status: He is alert and oriented to person, place, and time. Mental status is at baseline.     Cranial Nerves: No cranial nerve deficit.     Coordination: Coordination normal.  Psychiatric:        Mood and Affect: Mood normal.     LABORATORY DATA:  I have reviewed the data as listed    Latest Ref Rng & Units 03/17/2022   11:11 AM 02/22/2022    5:15 AM 02/21/2022   11:24 AM  CBC  WBC 4.0 - 10.5 K/uL 5.0  4.3  4.8   Hemoglobin 13.0 - 17.0 g/dL 12.7  10.4  11.4   Hematocrit 39.0 - 52.0 % 38.4  32.0  35.2   Platelets 150 - 400 K/uL 218  137  175       Latest Ref Rng & Units 03/17/2022   11:11 AM 02/22/2022    5:15 AM 02/21/2022   10:40 PM  CMP  Glucose 70 - 99 mg/dL 104  98  95   BUN 8 - 23 mg/dL 27  37    Creatinine 0.61 - 1.24 mg/dL 1.58  1.37    Sodium 135 - 145 mmol/L 139  140    Potassium 3.5 - 5.1 mmol/L 4.6  5.1    Chloride 98 - 111 mmol/L 105  115    CO2 22 - 32 mmol/L 27  20    Calcium 8.9 - 10.3 mg/dL 9.3  9.2    Total Protein 6.5 - 8.1 g/dL 7.4     Total Bilirubin 0.3 - 1.2 mg/dL 0.6     Alkaline Phos 38 - 126 U/L 77     AST 15 - 41 U/L 30     ALT 0 - 44 U/L 47        RADIOGRAPHIC STUDIES: I have personally reviewed the radiological images as listed and agreed with the findings in the report. DG Chest Portable 1 View  Result Date: 02/21/2022 CLINICAL DATA:  Shortness of breath EXAM: PORTABLE CHEST 1 VIEW COMPARISON:  09/14/2021 FINDINGS: Left-sided implanted cardiac device and right chest port remain in place. Stable heart size status post sternotomy and CABG. No focal airspace consolidation, pleural effusion, or pneumothorax. IMPRESSION: No active disease. Electronically Signed   By: Davina Poke D.O.   On: 02/21/2022 13:43

## 2022-03-19 NOTE — Assessment & Plan Note (Signed)
Patient has no radiographic evidence of metastatic melanoma. Off immunotherapy. Continue surveillance 

## 2022-03-19 NOTE — Assessment & Plan Note (Signed)
Continue monitor

## 2022-03-21 DIAGNOSIS — Z510 Encounter for antineoplastic radiation therapy: Secondary | ICD-10-CM | POA: Diagnosis not present

## 2022-03-21 DIAGNOSIS — Z79899 Other long term (current) drug therapy: Secondary | ICD-10-CM | POA: Diagnosis not present

## 2022-03-21 DIAGNOSIS — C4359 Malignant melanoma of other part of trunk: Secondary | ICD-10-CM | POA: Diagnosis not present

## 2022-03-21 DIAGNOSIS — Z85820 Personal history of malignant melanoma of skin: Secondary | ICD-10-CM | POA: Diagnosis not present

## 2022-03-21 DIAGNOSIS — N1831 Chronic kidney disease, stage 3a: Secondary | ICD-10-CM | POA: Diagnosis not present

## 2022-03-21 DIAGNOSIS — I700 Atherosclerosis of aorta: Secondary | ICD-10-CM | POA: Diagnosis not present

## 2022-03-21 DIAGNOSIS — C221 Intrahepatic bile duct carcinoma: Secondary | ICD-10-CM | POA: Diagnosis not present

## 2022-03-21 DIAGNOSIS — Z452 Encounter for adjustment and management of vascular access device: Secondary | ICD-10-CM | POA: Diagnosis not present

## 2022-03-21 DIAGNOSIS — D631 Anemia in chronic kidney disease: Secondary | ICD-10-CM | POA: Diagnosis not present

## 2022-03-23 ENCOUNTER — Other Ambulatory Visit: Payer: Medicare Other

## 2022-03-23 ENCOUNTER — Other Ambulatory Visit: Payer: Self-pay

## 2022-03-23 ENCOUNTER — Inpatient Hospital Stay: Payer: Medicare Other

## 2022-03-23 ENCOUNTER — Ambulatory Visit
Admission: RE | Admit: 2022-03-23 | Discharge: 2022-03-23 | Disposition: A | Discharge status: Home / Self Care | Payer: Medicare Other | Source: Ambulatory Visit | Attending: Radiation Oncology | Admitting: Radiation Oncology

## 2022-03-23 DIAGNOSIS — Z510 Encounter for antineoplastic radiation therapy: Secondary | ICD-10-CM | POA: Diagnosis not present

## 2022-03-23 DIAGNOSIS — C221 Intrahepatic bile duct carcinoma: Secondary | ICD-10-CM | POA: Diagnosis not present

## 2022-03-23 DIAGNOSIS — Z85820 Personal history of malignant melanoma of skin: Secondary | ICD-10-CM | POA: Diagnosis not present

## 2022-03-23 DIAGNOSIS — Z95828 Presence of other vascular implants and grafts: Secondary | ICD-10-CM

## 2022-03-23 DIAGNOSIS — N1831 Chronic kidney disease, stage 3a: Secondary | ICD-10-CM | POA: Diagnosis not present

## 2022-03-23 DIAGNOSIS — D631 Anemia in chronic kidney disease: Secondary | ICD-10-CM | POA: Diagnosis not present

## 2022-03-23 DIAGNOSIS — Z79899 Other long term (current) drug therapy: Secondary | ICD-10-CM | POA: Diagnosis not present

## 2022-03-23 DIAGNOSIS — Z452 Encounter for adjustment and management of vascular access device: Secondary | ICD-10-CM | POA: Diagnosis not present

## 2022-03-23 DIAGNOSIS — I700 Atherosclerosis of aorta: Secondary | ICD-10-CM | POA: Diagnosis not present

## 2022-03-23 DIAGNOSIS — C4359 Malignant melanoma of other part of trunk: Secondary | ICD-10-CM | POA: Diagnosis not present

## 2022-03-23 LAB — RAD ONC ARIA SESSION SUMMARY
Course Elapsed Days: 0
Plan Fractions Treated to Date: 1
Plan Prescribed Dose Per Fraction: 1.8 Gy
Plan Total Fractions Prescribed: 25
Plan Total Prescribed Dose: 45 Gy
Reference Point Dosage Given to Date: 1.8 Gy
Reference Point Session Dosage Given: 1.8 Gy
Session Number: 1

## 2022-03-23 LAB — BASIC METABOLIC PANEL
Anion gap: 8 (ref 5–15)
BUN: 38 mg/dL — ABNORMAL HIGH (ref 8–23)
CO2: 21 mmol/L — ABNORMAL LOW (ref 22–32)
Calcium: 8.9 mg/dL (ref 8.9–10.3)
Chloride: 110 mmol/L (ref 98–111)
Creatinine, Ser: 1.62 mg/dL — ABNORMAL HIGH (ref 0.61–1.24)
GFR, Estimated: 48 mL/min — ABNORMAL LOW (ref >60)
Glucose, Bld: 142 mg/dL — ABNORMAL HIGH (ref 70–99)
Potassium: 4.6 mmol/L (ref 3.5–5.1)
Sodium: 139 mmol/L (ref 135–145)

## 2022-03-23 MED ORDER — HEPARIN SOD (PORK) LOCK FLUSH 100 UNIT/ML IV SOLN
500.0000 [IU] | Freq: Once | INTRAVENOUS | Status: AC
  Administered 2022-03-23: 500 [IU] via INTRAVENOUS
  Filled 2022-03-23: qty 5

## 2022-03-23 MED ORDER — SODIUM CHLORIDE 0.9% FLUSH
10.0000 mL | Freq: Once | INTRAVENOUS | Status: AC
  Administered 2022-03-23: 10 mL via INTRAVENOUS
  Filled 2022-03-23: qty 10

## 2022-03-24 ENCOUNTER — Ambulatory Visit
Admission: RE | Admit: 2022-03-24 | Discharge: 2022-03-24 | Disposition: A | Discharge status: Home / Self Care | Payer: Medicare Other | Source: Ambulatory Visit | Attending: Radiation Oncology | Admitting: Radiation Oncology

## 2022-03-24 ENCOUNTER — Other Ambulatory Visit: Payer: Self-pay

## 2022-03-24 DIAGNOSIS — I700 Atherosclerosis of aorta: Secondary | ICD-10-CM | POA: Diagnosis not present

## 2022-03-24 DIAGNOSIS — Z510 Encounter for antineoplastic radiation therapy: Secondary | ICD-10-CM | POA: Diagnosis not present

## 2022-03-24 DIAGNOSIS — Z452 Encounter for adjustment and management of vascular access device: Secondary | ICD-10-CM | POA: Diagnosis not present

## 2022-03-24 DIAGNOSIS — N1831 Chronic kidney disease, stage 3a: Secondary | ICD-10-CM | POA: Diagnosis not present

## 2022-03-24 DIAGNOSIS — Z79899 Other long term (current) drug therapy: Secondary | ICD-10-CM | POA: Diagnosis not present

## 2022-03-24 DIAGNOSIS — Z85820 Personal history of malignant melanoma of skin: Secondary | ICD-10-CM | POA: Diagnosis not present

## 2022-03-24 DIAGNOSIS — D631 Anemia in chronic kidney disease: Secondary | ICD-10-CM | POA: Diagnosis not present

## 2022-03-24 DIAGNOSIS — C4359 Malignant melanoma of other part of trunk: Secondary | ICD-10-CM | POA: Diagnosis not present

## 2022-03-24 DIAGNOSIS — C221 Intrahepatic bile duct carcinoma: Secondary | ICD-10-CM | POA: Diagnosis not present

## 2022-03-24 LAB — RAD ONC ARIA SESSION SUMMARY
Course Elapsed Days: 1
Plan Fractions Treated to Date: 2
Plan Prescribed Dose Per Fraction: 1.8 Gy
Plan Total Fractions Prescribed: 25
Plan Total Prescribed Dose: 45 Gy
Reference Point Dosage Given to Date: 3.6 Gy
Reference Point Session Dosage Given: 1.8 Gy
Session Number: 2

## 2022-03-27 ENCOUNTER — Other Ambulatory Visit: Payer: Self-pay | Admitting: Oncology

## 2022-03-28 ENCOUNTER — Other Ambulatory Visit: Payer: Self-pay

## 2022-03-28 ENCOUNTER — Ambulatory Visit
Admission: RE | Admit: 2022-03-28 | Discharge: 2022-03-28 | Disposition: A | Discharge status: Home / Self Care | Payer: 59 | Source: Ambulatory Visit | Attending: Radiation Oncology | Admitting: Radiation Oncology

## 2022-03-28 ENCOUNTER — Other Ambulatory Visit (HOSPITAL_COMMUNITY): Payer: Self-pay

## 2022-03-28 DIAGNOSIS — N1831 Chronic kidney disease, stage 3a: Secondary | ICD-10-CM | POA: Diagnosis not present

## 2022-03-28 DIAGNOSIS — Z510 Encounter for antineoplastic radiation therapy: Secondary | ICD-10-CM | POA: Insufficient documentation

## 2022-03-28 DIAGNOSIS — C221 Intrahepatic bile duct carcinoma: Secondary | ICD-10-CM | POA: Diagnosis not present

## 2022-03-28 DIAGNOSIS — C438 Malignant melanoma of overlapping sites of skin: Secondary | ICD-10-CM | POA: Insufficient documentation

## 2022-03-28 LAB — RAD ONC ARIA SESSION SUMMARY
Course Elapsed Days: 5
Plan Fractions Treated to Date: 3
Plan Prescribed Dose Per Fraction: 1.8 Gy
Plan Total Fractions Prescribed: 25
Plan Total Prescribed Dose: 45 Gy
Reference Point Dosage Given to Date: 5.4 Gy
Reference Point Session Dosage Given: 1.8 Gy
Session Number: 3

## 2022-03-29 ENCOUNTER — Other Ambulatory Visit: Payer: Self-pay

## 2022-03-29 ENCOUNTER — Ambulatory Visit
Admission: RE | Admit: 2022-03-29 | Discharge: 2022-03-29 | Disposition: A | Discharge status: Home / Self Care | Payer: 59 | Source: Ambulatory Visit | Attending: Radiation Oncology | Admitting: Radiation Oncology

## 2022-03-29 DIAGNOSIS — C438 Malignant melanoma of overlapping sites of skin: Secondary | ICD-10-CM | POA: Diagnosis not present

## 2022-03-29 DIAGNOSIS — C221 Intrahepatic bile duct carcinoma: Secondary | ICD-10-CM | POA: Diagnosis not present

## 2022-03-29 DIAGNOSIS — N1831 Chronic kidney disease, stage 3a: Secondary | ICD-10-CM | POA: Diagnosis not present

## 2022-03-29 DIAGNOSIS — Z510 Encounter for antineoplastic radiation therapy: Secondary | ICD-10-CM | POA: Diagnosis not present

## 2022-03-29 LAB — RAD ONC ARIA SESSION SUMMARY
Course Elapsed Days: 6
Plan Fractions Treated to Date: 4
Plan Prescribed Dose Per Fraction: 1.8 Gy
Plan Total Fractions Prescribed: 25
Plan Total Prescribed Dose: 45 Gy
Reference Point Dosage Given to Date: 7.2 Gy
Reference Point Session Dosage Given: 1.8 Gy
Session Number: 4

## 2022-03-30 ENCOUNTER — Inpatient Hospital Stay: Payer: 59

## 2022-03-30 ENCOUNTER — Encounter: Payer: Self-pay | Admitting: Oncology

## 2022-03-30 ENCOUNTER — Ambulatory Visit
Admission: RE | Admit: 2022-03-30 | Discharge: 2022-03-30 | Disposition: A | Discharge status: Home / Self Care | Payer: 59 | Source: Ambulatory Visit | Attending: Radiation Oncology | Admitting: Radiation Oncology

## 2022-03-30 ENCOUNTER — Inpatient Hospital Stay (HOSPITAL_BASED_OUTPATIENT_CLINIC_OR_DEPARTMENT_OTHER): Payer: 59 | Admitting: Oncology

## 2022-03-30 ENCOUNTER — Other Ambulatory Visit (HOSPITAL_COMMUNITY): Payer: Self-pay

## 2022-03-30 ENCOUNTER — Other Ambulatory Visit: Payer: Self-pay

## 2022-03-30 VITALS — BP 126/71 | HR 81 | Temp 96.6°F | Wt 248.5 lb

## 2022-03-30 DIAGNOSIS — D631 Anemia in chronic kidney disease: Secondary | ICD-10-CM

## 2022-03-30 DIAGNOSIS — N1832 Chronic kidney disease, stage 3b: Secondary | ICD-10-CM | POA: Diagnosis not present

## 2022-03-30 DIAGNOSIS — Z5111 Encounter for antineoplastic chemotherapy: Secondary | ICD-10-CM

## 2022-03-30 DIAGNOSIS — C438 Malignant melanoma of overlapping sites of skin: Secondary | ICD-10-CM

## 2022-03-30 DIAGNOSIS — N1831 Chronic kidney disease, stage 3a: Secondary | ICD-10-CM | POA: Insufficient documentation

## 2022-03-30 DIAGNOSIS — C221 Intrahepatic bile duct carcinoma: Secondary | ICD-10-CM

## 2022-03-30 DIAGNOSIS — Z510 Encounter for antineoplastic radiation therapy: Secondary | ICD-10-CM | POA: Diagnosis not present

## 2022-03-30 LAB — RAD ONC ARIA SESSION SUMMARY
Course Elapsed Days: 7
Plan Fractions Treated to Date: 5
Plan Prescribed Dose Per Fraction: 1.8 Gy
Plan Total Fractions Prescribed: 25
Plan Total Prescribed Dose: 45 Gy
Reference Point Dosage Given to Date: 9 Gy
Reference Point Session Dosage Given: 1.8 Gy
Session Number: 5

## 2022-03-30 LAB — COMPREHENSIVE METABOLIC PANEL
ALT: 27 U/L (ref 0–44)
AST: 24 U/L (ref 15–41)
Albumin: 4 g/dL (ref 3.5–5.0)
Alkaline Phosphatase: 67 U/L (ref 38–126)
Anion gap: 5 (ref 5–15)
BUN: 32 mg/dL — ABNORMAL HIGH (ref 8–23)
CO2: 23 mmol/L (ref 22–32)
Calcium: 8.7 mg/dL — ABNORMAL LOW (ref 8.9–10.3)
Chloride: 109 mmol/L (ref 98–111)
Creatinine, Ser: 1.49 mg/dL — ABNORMAL HIGH (ref 0.61–1.24)
GFR, Estimated: 53 mL/min — ABNORMAL LOW (ref >60)
Glucose, Bld: 138 mg/dL — ABNORMAL HIGH (ref 70–99)
Potassium: 4.4 mmol/L (ref 3.5–5.1)
Sodium: 137 mmol/L (ref 135–145)
Total Bilirubin: 0.6 mg/dL (ref 0.3–1.2)
Total Protein: 6.9 g/dL (ref 6.5–8.1)

## 2022-03-30 LAB — CBC WITH DIFFERENTIAL/PLATELET
Abs Immature Granulocytes: 0.05 10*3/uL (ref 0.00–0.07)
Basophils Absolute: 0 10*3/uL (ref 0.0–0.1)
Basophils Relative: 1 %
Eosinophils Absolute: 0.1 10*3/uL (ref 0.0–0.5)
Eosinophils Relative: 2 %
HCT: 35 % — ABNORMAL LOW (ref 39.0–52.0)
Hemoglobin: 11.9 g/dL — ABNORMAL LOW (ref 13.0–17.0)
Immature Granulocytes: 1 %
Lymphocytes Relative: 15 %
Lymphs Abs: 0.7 10*3/uL (ref 0.7–4.0)
MCH: 30.6 pg (ref 26.0–34.0)
MCHC: 34 g/dL (ref 30.0–36.0)
MCV: 90 fL (ref 80.0–100.0)
Monocytes Absolute: 0.4 10*3/uL (ref 0.1–1.0)
Monocytes Relative: 10 %
Neutro Abs: 3.2 10*3/uL (ref 1.7–7.7)
Neutrophils Relative %: 71 %
Platelets: 180 10*3/uL (ref 150–400)
RBC: 3.89 MIL/uL — ABNORMAL LOW (ref 4.22–5.81)
RDW: 13.2 % (ref 11.5–15.5)
WBC: 4.4 10*3/uL (ref 4.0–10.5)
nRBC: 0 % (ref 0.0–0.2)

## 2022-03-30 MED ORDER — OYSTER SHELL CALCIUM/D3 500-5 MG-MCG PO TABS: 1.0000 | ORAL_TABLET | Freq: Every day | ORAL | 0 refills | Status: DC

## 2022-03-30 NOTE — Progress Notes (Signed)
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  Proceed with concurrent chemotherapy Xeloda 857m/m2 BID on his radiation days.  After concurrent chemo RT plan additional 4 months of Xeloda [1000 mg/m2 BID for 14 of every 21 days]  Labs are reviewed and discussed with patient. He tolerates treatment so far. Continue concurrent chemotherapy Xeloda w RT  Stage 3a chronic kidney disease ([place] avoid nephrotoxins.   creatinine fluctuates. Encourage oral hydration.     Malignant melanoma of overlapping sites ([facility] Patient has no radiographic evidence of metastatic melanoma. Off immunotherapy. Continue surveillance  Encounter for antineoplastic chemotherapy Chemotherapy plan as listed above.   Anemia in chronic kidney disease Continue monitor.     Orders Placed This Encounter  Procedures   CBC with Differential/Platelet    Standing Status:   Future    Standing Expiration Date:   03/31/2023   Comprehensive metabolic panel    Standing Status:   Future    Standing Expiration Date:   03/30/2023    Follow-up in 10  days  All questions were answered. The patient knows to call the clinic with any problems, questions or concerns.  ZEarlie Server MD, PhD [facility] Oncology 03/30/2022   CHIEF COMPLAINTS/REASON FOR VISIT:  Follow up for melanoma HISTORY OF PRESENTING ILLNESS:   Edward Pitts a  62y.o.  male presents for recurrent malignant melanoma.   Oncology History  Malignant melanoma of overlapping sites ([facility]  04/16/2019 Cancer Staging   Staging form: Melanoma of the Skin, AJCC 8th Edition - Pathologic: Stage Unknown (rpTX, pN1b, cM0) - Signed by YEarlie Server MD on 07/27/2020 Stage prefix: Recurrence    04/23/2019 Initial Diagnosis   Malignant melanoma   -He has a history of left lower extremity melanoma in 2011, status post local excision -04/16/2019 patient underwent left groin mass resection  Resection pathology showed malignant melanoma, replacing a lymph node, with extracapsular extension, peripheral and deep margins involved.  Left inguinal contents, all 7 lymph nodes were negative for melanoma in the lymph nodes. Extranodal melanoma identified in lymphatic and interstitium between nodes -PDL1 80% TPS    07/07/2019 -  Radiation Therapy   status post adjuvant radiation.   07/23/2019 - 07/21/2021 Chemotherapy   Nivolumab q14d      06/29/2020 Imaging   CT chest abdomen pelvis showed stable postoperative appearance of the left groin.  No evidence of local recurrence.  No evidence of metastatic disease in the chest abdomen or pelvis.  Hepatic steatosis.  Stable subcentimeter fluid attenuation lesion of the lateral right lobe of the liver, likely benign cyst or hemangioma.  Coronary artery disease.  Aortic atherosclerosis   10/20/2020 Imaging   CT chest abdomen pelvis showed stable  postoperative/radiation appearance of the left groin.  No evidence of local recurrence/metastatic disease within the chest abdomen/pelvis.  Fatty liver disease.  Diverticulosis without evidence of typhlitis.   Aortic atherosclerosis   03/10/2021 Imaging   MRI brain without contrast showed no definitive evidence of intracranial metastatic disease.  Study is limited by absence of intravenous contrast.     04/26/2021 Imaging   CT chest abdomen pelvis without contrast showed stable post operative changes of left groin with no evidence of recurrent disease.  No evidence of metastatic disease in the chest abdomen pelvis.  Aortic atherosclerosis   08/08/2021 - 08/16/2021 Hospital Admission    patient was hospitalized due to NSTEMI status post CABG x3.  He also had pacemaker The echocardiogram showed left ventricular ejection fraction of 65 to 70%,   09/15/2021 Imaging   CT chest abdomen pelvis w contrast  IMPRESSION: 1. Subtle hypodense 9 mm lesion in the left lobe of the liver is new from prior imaging including previous contrasted CT dating back to December 10, 2019, with the lesion appearing to equilibrate with background liver on delayed imaging sequence but is incompletely evaluated on this imaging study and technically nonspecific possibly reflecting a benign perfusional variant and while its appearance is not typical for that of a melanoma metastasis, it is not excluded on this examination. Suggest more definitive characterization by hepatic protocol MRI with and without contrast. 2. Stable postoperative changes in the left groin without evidence of local recurrent disease.3. No evidence of metastatic disease in the chest or pelvis.4.  Aortic Atherosclerosis (ICD10-I70.0).      09/23/2021 Imaging   Contrast-enhanced liver ultrasound Mildly hypoenhancing 2.2 cm mass in the posterior aspect of the left lobe of the liver with washout characteristics concerning for non hepatocellular malignancy, concerning for melanotic metastasis given history. The lesion is in an unfavorable location for percutaneous biopsy. Consider PET-CT for further characterization   10/21/2021 -  Chemotherapy   Nivolumab q14d       11/10/2021 Imaging   MRI Brain w wo contrast Negative for metastatic disease to the brain   11/10/2021 Imaging   MRI abdomen w wo contrast 1. Lesion of the posterior superior left lobe of the liver, hepatic segment II, measuring 1.9 x 1.8 cm corresponding to findings of prior imaging. Evaluation is somewhat limited breath motion artifact however there is subtle associated rim enhancement of this lesion. Findings are most in keeping with a hepatic metastasis in the setting of known recurrent melanoma. 2. Mild hepatic steatosis. 3. Cardiomegaly.    02/27/2022 -  Chemotherapy   Patient is on Treatment Plan : Capecitabine (825 mg/m2 bid) + XRT     Cholangiocarcinoma ([place])  12/19/2021 Initial Diagnosis   Liver biopsy showed carcinoma  -The slides on the patient's prior left inguinal lymph node biopsy ([id number]) with metastatic melanoma were reviewed in conjunction with  this case. The morphology of the malignant cells between the two cases are dissimilar. A limited panel of immunohistochemical stains was performed and the neoplastic cells are positive for superpancytokeratin, cytokeratin 7 (diffuse, strong), cytokeratin 20 (patchy, moderate) and negative for S100, SOX-10, and HepPar-1.These findings are consistent with carcinoma. The morphologic findings and pattern of immunohistochemical staining are non-specific and possible sites of origin include but are not limited to pancreaticobiliary tract, GI tract, prostate, kidney, lung, and breast. Per [facility], the patient had a PET scan performed in July 2023 which demonstrated a single hepatic  hypermetabolic lesion without other sites of disease.    01/19/2022  Surgery   Liver lesion resection at [facility] by Dr.Zani  A.  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  Tumor extends to hepatic parenchymal margin, but all other margins are negative for tumor. See synoptic report and  comment.   Background liver with: Mild steatosis (20%).  No definitive evidence of steatohepatitis.   Mild periportal fibrosis (trichrome and reticulin). No stainable iron (Prussian blue stain).  No evidence to support alpha-1-antitrypsin deficiency on PAS-D stain.   pT1a pNx   01/25/2022 Cancer Staging   Staging form: Intrahepatic Bile Duct, AJCC 8th Edition - Pathologic stage from 01/25/2022: Stage Unknown (pT1a, pNX, cM0) - Signed by [name], MD on 02/21/2022 Stage prefix: Initial diagnosis    04/18/2021, patient establish care with neurology Dr. Mickeal Skinner for intermittent altered cognition.  he was recommended to start Vimpat.    INTERVAL HISTORY Edward Pitts is a 62 y.o. male who has above history reviewed by me today presents for follow up visit for management of inguinal nodal recurrence of melanoma, resected T1a cholangiocarcinoma.  He is on concurrent chemotherapy Xeloda with radiation. He tolerates well. Denies any nausea, vomiting, diarrhea.    :Review of Systems  Constitutional:  Negative for appetite change, chills, fatigue, fever and unexpected weight change.  HENT:   Negative for hearing loss and voice change.   Eyes:  Negative for eye problems and icterus.  Respiratory:  Negative for chest tightness, cough and shortness of breath.   Cardiovascular:  Positive for leg swelling. Negative for chest pain.  Gastrointestinal:  Negative for abdominal distention and abdominal pain.  Endocrine: Negative for hot flashes.  Genitourinary:  Negative for difficulty urinating, dysuria and frequency.   Musculoskeletal:        Status post left hip replacement  Skin:  Negative for itching and rash.       Skin hypo-pigmentation on upper extremities, no change  Neurological:  Negative for light-headedness and numbness.       Spells  Hematological:  Negative for adenopathy. Does not bruise/bleed easily.  Psychiatric/Behavioral:  Negative for confusion.     MEDICAL HISTORY:  Past  Medical History:  Diagnosis Date   Anemia    iron treatments   Anxiety    Aortic atherosclerosis (HCC)    Arthritis    Cancer of groin ([place]) 2021   left groin, resected, radiation   Cataract    Complication of anesthesia    PONV   Coronary artery disease    Dizziness of unknown etiology    has led to seizures and passing out.   Family history of adverse reaction to anesthesia    PONV mother   GERD (gastroesophageal reflux disease)    History of complete heart block    PPM placed   Hyperlipidemia    Hypertension    LBBB (left bundle branch block)    Lymphedema of left leg    uses thigh high compression stockings   Melanoma ([place]) 2012   skin cancer, left thigh   OSA on CPAP    PONV (postoperative nausea and vomiting) 04/16/2019   Port-A-Cath in place    RIGHT chest wall   Presence of cardiac pacemaker    [employer]   Seizures ([place])    still has episodes of dizziness. last event 1 month ago (march 2022) and will pass out. takes clonazepam    SURGICAL HISTORY: Past Surgical History:  Procedure Laterality Date   CORONARY ARTERY BYPASS GRAFT N/A 08/10/2021  Procedure: CORONARY ARTERY BYPASS GRAFTING (CABG) X 3 USING LEFT INTERNAL MAMMARY ARTERY AND RIGHT GREATER SAPHENOUS VEIN;  Surgeon: Dahlia Byes, MD;  Location: [place];  Service: Open Heart Surgery;  Laterality: N/A;   CT RADIATION THERAPY GUIDE     left groin   dental implant     permanent implant   ENDOVEIN HARVEST OF GREATER SAPHENOUS VEIN Right 08/10/2021   Procedure: ENDOVEIN HARVEST OF GREATER SAPHENOUS VEIN;  Surgeon: Dahlia Byes, MD;  Location: [place];  Service: Open Heart Surgery;  Laterality: Right;   KNEE SURGERY Left    arthroscopy   LEFT HEART CATH AND CORONARY ANGIOGRAPHY Left 06/29/2017   Procedure: LEFT HEART CATH AND CORONARY ANGIOGRAPHY;  Surgeon: [name], MD;  Location: [place] [facility];  Service: Cardiovascular;  Laterality: Left;   LEFT HEART CATH AND CORONARY ANGIOGRAPHY N/A  08/08/2021   Procedure: LEFT HEART CATH AND CORONARY ANGIOGRAPHY;  Surgeon: [name], MD;  Location: [place] [facility];  Service: Cardiovascular;  Laterality: N/A;   LYMPH NODE DISSECTION Left 04/16/2019   Procedure: Left inguinal Lymph Node Dissection;  Surgeon: [name], MD;  Location: [place];  Service: General;  Laterality: Left;   MELANOMA EXCISION Left 04/16/2019   Procedure: MELANOMA EXCISION LEFT GROIN MASS;  Surgeon: [name], MD;  Location: [place];  Service: General;  Laterality: Left;   MELANOMA EXCISION WITH SENTINEL LYMPH NODE BIOPSY Left 2012   Left calf    PACEMAKER INSERTION N/A 08/26/2018   Procedure: INSERTION PACEMAKER;  Surgeon: [name], MD;  Location: [facility];  Service: Cardiovascular;  Laterality: N/A;   PORTA CATH INSERTION N/A 08/26/2019   Procedure: PORTA CATH INSERTION;  Surgeon: Katha Cabal, MD;  Location: [place] [facility];  Service: Cardiovascular;  Laterality: N/A;   SUPERFICIAL LYMPH NODE BIOPSY / EXCISION Left 2020   lymph nodes removed around left groin melanoma site   TEE WITHOUT CARDIOVERSION N/A 08/10/2021   Procedure: TRANSESOPHAGEAL ECHOCARDIOGRAM (TEE);  Surgeon: Dahlia Byes, MD;  Location: [place];  Service: Open Heart Surgery;  Laterality: N/A;   TEMPORARY PACEMAKER N/A 08/25/2018   Procedure: TEMPORARY PACEMAKER;  Surgeon: [name], MD;  Location: [place] [facility];  Service: Cardiovascular;  Laterality: N/A;   TOTAL HIP ARTHROPLASTY Left 07/14/2020   Procedure: TOTAL HIP ARTHROPLASTY;  Surgeon: Dereck Leep, MD;  Location: [facility];  Service: Orthopedics;  Laterality: Left;    SOCIAL HISTORY: Social History   Socioeconomic History   Marital status: Married    Spouse name: Vicente Males    Number of children: 7   Years of education: 12   Highest education level: Not on file  Occupational History    Comment: disability  Tobacco Use   Smoking status: Never   Smokeless tobacco: Never  Vaping Use    Vaping Use: Never used  Substance and Sexual Activity   Alcohol use: No   Drug use: No   Sexual activity: Not Currently  Other Topics Concern   Not on file  Social History Narrative   Lives with  Wife,   Has 2 small dogs   Caffeine use: sodas (2 per day)      Out of work on disability.  Has a walk in shower. No stairs to climb   Oncology treatment ongoing. Uses port a cath for treatment.      pacemaker   Social Determinants of Health   Financial Resource Strain: Low Risk  (02/12/2019)   Overall Financial  Resource Strain (CARDIA)    Difficulty of Paying Living Expenses: Not hard at all  Food Insecurity: Food Insecurity Present (02/21/2022)   Hunger Vital Sign    Worried About Running Out of Food in the Last Year: Sometimes true    Ran Out of Food in the Last Year: Often true  Transportation Needs: No Transportation Needs (02/21/2022)   PRAPARE - Hydrologist (Medical): No    Lack of Transportation (Non-Medical): No  Physical Activity: Unknown (02/12/2019)   Exercise Vital Sign    Days of Exercise per Week: 0 days    Minutes of Exercise per Session: Not on file  Stress: No Stress Concern Present (02/12/2019)   [place]    Feeling of Stress : Only a little  Social Connections: Unknown (02/12/2019)   Social Connection and Isolation Panel [NHANES]    Frequency of Communication with Friends and Family: More than three times a week    Frequency of Social Gatherings with Friends and Family: Not on file    Attends Religious Services: Not on file    Active Member of Clubs or Organizations: Not on file    Attends Archivist Meetings: Not on file    Marital Status: Married  Intimate Partner Violence: Not At Risk (02/21/2022)   Humiliation, Afraid, Rape, and Kick questionnaire    Fear of Current or Ex-Partner: No    Emotionally Abused: No    Physically Abused: No    Sexually  Abused: No    FAMILY HISTORY: Family History  Problem Relation Age of Onset   Cancer Paternal Grandmother     ALLERGIES:  is allergic to ibuprofen, levetiracetam, and nsaids.  MEDICATIONS:  Current Outpatient Medications  Medication Sig Dispense Refill   apixaban (ELIQUIS) 5 MG TABS tablet Take 5 mg by mouth 2 (two) times daily.     aspirin 81 MG EC tablet Take 81 mg by mouth daily.     atorvastatin (LIPITOR) 20 MG tablet Take 20 mg by mouth daily.     calcium-vitamin D (OSCAL WITH D) 500-5 MG-MCG tablet Take 1 tablet by mouth daily. 30 tablet 0   capecitabine (XELODA) 150 MG tablet Take 2 tablets (300 mg total) by mouth 2 (two) times daily after a meal. Take Monday-Friday. Take only on days of radiation. Take along with 54m tablets. 100 tablet 0   capecitabine (XELODA) 500 MG tablet Take 3 tablets (1,500 mg total) by mouth 2 (two) times daily after a meal. Take Monday-Friday. Take only on days of radiation. Take along with 1579mtablets. 150 tablet 0   Carboxymeth-Glyc-Polysorb PF (REFRESH OPTIVE MEGA-3) 0.5-1-0.5 % SOLN Place 1 drop into both eyes daily as needed (dry eyes).     clonazePAM (KLONOPIN) 0.5 MG tablet TAKE 1 TABLET BY MOUTH IN THE MORNING AND 2 TABS BY MOUTH IN THE EVENING 90 tablet 0   febuxostat (ULORIC) 40 MG tablet Take 40 mg by mouth daily.     ferrous sulfate 325 (65 FE) MG EC tablet TAKE 1 TABLET BY MOUTH 2 TIMES DAILY WITH A MEAL. 180 tablet 1   hydrochlorothiazide (HYDRODIURIL) 25 MG tablet Take 1 tablet (25 mg total) by mouth daily. 30 tablet 0   hydrocortisone 2.5 % ointment Apply 1 application. topically daily as needed (itching).     Lacosamide 100 MG TABS TAKE 1 TABLET (100 MG TOTAL) BY MOUTH IN THE MORNING AND AT BEDTIME. 60 tablet 3  lidocaine-prilocaine (EMLA) cream Apply 1 application. topically as needed. Apply small amount of cream to port site approx 1-2 hours prior to appointment. 30 g 11   lisinopril (ZESTRIL) 20 MG tablet Take 20 mg by mouth  daily.     metoprolol succinate (TOPROL-XL) 25 MG 24 hr tablet Take 25 mg by mouth daily.     Multiple Vitamin (MULTIVITAMIN WITH MINERALS) TABS tablet Take 1 tablet by mouth daily. Centrum Silver     omeprazole (PRILOSEC) 20 MG capsule TAKE 1 CAPSULE BY MOUTH EVERY DAY 90 capsule 0   ondansetron (ZOFRAN) 4 MG tablet TAKE 1 TABLET BY MOUTH EVERY 8 HOURS AS NEEDED FOR NAUSEA AND VOMITING 90 tablet 1   traZODone (DESYREL) 50 MG tablet TAKE 1 TABLET BY MOUTH AT BEDTIME AS NEEDED FOR SLEEP. 30 tablet 0   No current facility-administered medications for this visit.   Facility-Administered Medications Ordered in Other Visits  Medication Dose Route Frequency Provider Last Rate Last Admin   heparin lock flush 100 UNIT/ML injection            heparin lock flush 100 UNIT/ML injection              PHYSICAL EXAMINATION: ECOG PERFORMANCE STATUS: 1 - Symptomatic but completely ambulatory Vitals:   03/30/22 1017  BP: 126/71  Pulse: 81  Temp: (!) 96.6 F (35.9 C)  SpO2: 99%   Filed Weights   03/30/22 1017  Weight: 248 lb 8 oz (112.7 kg)    Physical Exam Constitutional:      General: He is not in acute distress.    Comments: Patient ambulates independently  HENT:     Head: Normocephalic and atraumatic.  Eyes:     General: No scleral icterus.    Pupils: Pupils are equal, round, and reactive to light.  Cardiovascular:     Rate and Rhythm: Normal rate and regular rhythm.     Heart sounds: Normal heart sounds.  Pulmonary:     Effort: Pulmonary effort is normal. No respiratory distress.     Breath sounds: No wheezing.  Abdominal:     General: Bowel sounds are normal. There is no distension.     Palpations: Abdomen is soft. There is no mass.     Tenderness: There is no abdominal tenderness.     Comments:    Musculoskeletal:        General: No deformity. Normal range of motion.     Cervical back: Normal range of motion and neck supple.     Comments: Left lower extremity edema-chronic    Skin:    General: Skin is warm and dry.  Neurological:     Mental Status: He is alert and oriented to person, place, and time. Mental status is at baseline.     Cranial Nerves: No cranial nerve deficit.     Coordination: Coordination normal.  Psychiatric:        Mood and Affect: Mood normal.     LABORATORY DATA:  I have reviewed the data as listed    Latest Ref Rng & Units 03/30/2022    9:37 AM 03/17/2022   11:11 AM 02/22/2022    5:15 AM  CBC  WBC 4.0 - 10.5 K/uL 4.4  5.0  4.3   Hemoglobin 13.0 - 17.0 g/dL 11.9  12.7  10.4   Hematocrit 39.0 - 52.0 % 35.0  38.4  32.0   Platelets 150 - 400 K/uL 180  218  137       Latest  Ref Rng & Units 03/30/2022    9:37 AM 03/23/2022    8:32 AM 03/17/2022   11:11 AM  CMP  Glucose 70 - 99 mg/dL 138  142  104   BUN 8 - 23 mg/dL 32  38  27   Creatinine 0.61 - 1.24 mg/dL 1.49  1.62  1.58   Sodium 135 - 145 mmol/L 137  139  139   Potassium 3.5 - 5.1 mmol/L 4.4  4.6  4.6   Chloride 98 - 111 mmol/L 109  110  105   CO2 22 - 32 mmol/L _0 Calcium 8.9 - 10.3 mg/dL 8.7  8.9  9.3   Total Protein 6.5 - 8.1 g/dL 6.9   7.4   Total Bilirubin 0.3 - 1.2 mg/dL 0.6   0.6   Alkaline Phos 38 - 126 U/L 67   77   AST 15 - 41 U/L 24   30   ALT 0 - 44 U/L 27   47      RADIOGRAPHIC STUDIES: I have personally reviewed the radiological images as listed and agreed with the findings in the report. DG Chest Portable 1 View  Result Date: 02/21/2022 CLINICAL DATA:  Shortness of breath EXAM: PORTABLE CHEST 1 VIEW COMPARISON:  09/14/2021 FINDINGS: Left-sided implanted cardiac device and right chest port remain in place. Stable heart size status post sternotomy and CABG. No focal airspace consolidation, pleural effusion, or pneumothorax. IMPRESSION: No active disease. Electronically Signed   By: Davina Poke D.O.   On: 02/21/2022 13:43

## 2022-03-30 NOTE — Assessment & Plan Note (Signed)
Chemotherapy plan as listed above 

## 2022-03-30 NOTE — Assessment & Plan Note (Signed)
Patient has no radiographic evidence of metastatic melanoma. Off immunotherapy. Continue surveillance 

## 2022-03-30 NOTE — Assessment & Plan Note (Signed)
avoid nephrotoxins.   creatinine fluctuates. Encourage oral hydration.

## 2022-03-30 NOTE — Assessment & Plan Note (Signed)
Continue monitor

## 2022-03-30 NOTE — Assessment & Plan Note (Addendum)
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  Proceed with concurrent chemotherapy Xeloda '825mg'$ /m2 BID on his radiation days.  After concurrent chemo RT plan additional 4 months of Xeloda [1000 mg/m2 BID for 14 of every 21 days]  Labs are reviewed and discussed with patient. He tolerates treatment so far. Continue concurrent chemotherapy Xeloda w RT

## 2022-03-31 ENCOUNTER — Ambulatory Visit
Admission: RE | Admit: 2022-03-31 | Discharge: 2022-03-31 | Disposition: A | Discharge status: Home / Self Care | Payer: 59 | Source: Ambulatory Visit | Attending: Radiation Oncology | Admitting: Radiation Oncology

## 2022-03-31 ENCOUNTER — Other Ambulatory Visit: Payer: Self-pay

## 2022-03-31 DIAGNOSIS — C438 Malignant melanoma of overlapping sites of skin: Secondary | ICD-10-CM | POA: Diagnosis not present

## 2022-03-31 DIAGNOSIS — C221 Intrahepatic bile duct carcinoma: Secondary | ICD-10-CM | POA: Diagnosis not present

## 2022-03-31 DIAGNOSIS — Z510 Encounter for antineoplastic radiation therapy: Secondary | ICD-10-CM | POA: Diagnosis not present

## 2022-03-31 DIAGNOSIS — N1831 Chronic kidney disease, stage 3a: Secondary | ICD-10-CM | POA: Diagnosis not present

## 2022-03-31 LAB — RAD ONC ARIA SESSION SUMMARY
Course Elapsed Days: 8
Plan Fractions Treated to Date: 6
Plan Prescribed Dose Per Fraction: 1.8 Gy
Plan Total Fractions Prescribed: 25
Plan Total Prescribed Dose: 45 Gy
Reference Point Dosage Given to Date: 10.8 Gy
Reference Point Session Dosage Given: 1.8 Gy
Session Number: 6

## 2022-04-03 ENCOUNTER — Encounter: Payer: Self-pay | Admitting: Oncology

## 2022-04-03 ENCOUNTER — Ambulatory Visit
Admission: RE | Admit: 2022-04-03 | Discharge: 2022-04-03 | Disposition: A | Discharge status: Home / Self Care | Payer: 59 | Source: Ambulatory Visit | Attending: Radiation Oncology | Admitting: Radiation Oncology

## 2022-04-03 ENCOUNTER — Other Ambulatory Visit: Payer: Self-pay

## 2022-04-03 DIAGNOSIS — N1831 Chronic kidney disease, stage 3a: Secondary | ICD-10-CM | POA: Diagnosis not present

## 2022-04-03 DIAGNOSIS — Z510 Encounter for antineoplastic radiation therapy: Secondary | ICD-10-CM | POA: Diagnosis not present

## 2022-04-03 DIAGNOSIS — C221 Intrahepatic bile duct carcinoma: Secondary | ICD-10-CM | POA: Diagnosis not present

## 2022-04-03 DIAGNOSIS — C438 Malignant melanoma of overlapping sites of skin: Secondary | ICD-10-CM | POA: Diagnosis not present

## 2022-04-03 LAB — RAD ONC ARIA SESSION SUMMARY
Course Elapsed Days: 11
Plan Fractions Treated to Date: 7
Plan Prescribed Dose Per Fraction: 1.8 Gy
Plan Total Fractions Prescribed: 25
Plan Total Prescribed Dose: 45 Gy
Reference Point Dosage Given to Date: 12.6 Gy
Reference Point Session Dosage Given: 1.8 Gy
Session Number: 7

## 2022-04-04 ENCOUNTER — Ambulatory Visit
Admission: RE | Admit: 2022-04-04 | Discharge: 2022-04-04 | Disposition: A | Discharge status: Home / Self Care | Payer: 59 | Source: Ambulatory Visit | Attending: Radiation Oncology | Admitting: Radiation Oncology

## 2022-04-04 ENCOUNTER — Other Ambulatory Visit: Payer: Self-pay

## 2022-04-04 DIAGNOSIS — N1831 Chronic kidney disease, stage 3a: Secondary | ICD-10-CM | POA: Diagnosis not present

## 2022-04-04 DIAGNOSIS — Z510 Encounter for antineoplastic radiation therapy: Secondary | ICD-10-CM | POA: Diagnosis not present

## 2022-04-04 DIAGNOSIS — C221 Intrahepatic bile duct carcinoma: Secondary | ICD-10-CM | POA: Diagnosis not present

## 2022-04-04 DIAGNOSIS — C438 Malignant melanoma of overlapping sites of skin: Secondary | ICD-10-CM | POA: Diagnosis not present

## 2022-04-04 LAB — RAD ONC ARIA SESSION SUMMARY
Course Elapsed Days: 12
Plan Fractions Treated to Date: 8
Plan Prescribed Dose Per Fraction: 1.8 Gy
Plan Total Fractions Prescribed: 25
Plan Total Prescribed Dose: 45 Gy
Reference Point Dosage Given to Date: 14.4 Gy
Reference Point Session Dosage Given: 1.8 Gy
Session Number: 8

## 2022-04-05 ENCOUNTER — Ambulatory Visit: Payer: 59

## 2022-04-06 ENCOUNTER — Ambulatory Visit
Admission: RE | Admit: 2022-04-06 | Discharge: 2022-04-06 | Disposition: A | Discharge status: Home / Self Care | Payer: 59 | Source: Ambulatory Visit | Attending: Radiation Oncology | Admitting: Radiation Oncology

## 2022-04-06 ENCOUNTER — Other Ambulatory Visit: Payer: Self-pay

## 2022-04-06 ENCOUNTER — Other Ambulatory Visit: Payer: Self-pay | Admitting: Oncology

## 2022-04-06 DIAGNOSIS — N1831 Chronic kidney disease, stage 3a: Secondary | ICD-10-CM | POA: Diagnosis not present

## 2022-04-06 DIAGNOSIS — Z510 Encounter for antineoplastic radiation therapy: Secondary | ICD-10-CM | POA: Diagnosis not present

## 2022-04-06 DIAGNOSIS — C438 Malignant melanoma of overlapping sites of skin: Secondary | ICD-10-CM | POA: Diagnosis not present

## 2022-04-06 DIAGNOSIS — C221 Intrahepatic bile duct carcinoma: Secondary | ICD-10-CM | POA: Diagnosis not present

## 2022-04-06 LAB — RAD ONC ARIA SESSION SUMMARY
Course Elapsed Days: 14
Plan Fractions Treated to Date: 9
Plan Prescribed Dose Per Fraction: 1.8 Gy
Plan Total Fractions Prescribed: 25
Plan Total Prescribed Dose: 45 Gy
Reference Point Dosage Given to Date: 16.2 Gy
Reference Point Session Dosage Given: 1.8 Gy
Session Number: 9

## 2022-04-07 ENCOUNTER — Other Ambulatory Visit: Payer: Self-pay

## 2022-04-07 ENCOUNTER — Ambulatory Visit
Admission: RE | Admit: 2022-04-07 | Discharge: 2022-04-07 | Disposition: A | Discharge status: Home / Self Care | Payer: 59 | Source: Ambulatory Visit | Attending: Radiation Oncology | Admitting: Radiation Oncology

## 2022-04-07 DIAGNOSIS — C438 Malignant melanoma of overlapping sites of skin: Secondary | ICD-10-CM | POA: Diagnosis not present

## 2022-04-07 DIAGNOSIS — N1831 Chronic kidney disease, stage 3a: Secondary | ICD-10-CM | POA: Diagnosis not present

## 2022-04-07 DIAGNOSIS — C221 Intrahepatic bile duct carcinoma: Secondary | ICD-10-CM | POA: Diagnosis not present

## 2022-04-07 DIAGNOSIS — Z510 Encounter for antineoplastic radiation therapy: Secondary | ICD-10-CM | POA: Diagnosis not present

## 2022-04-07 LAB — RAD ONC ARIA SESSION SUMMARY
Course Elapsed Days: 15
Plan Fractions Treated to Date: 10
Plan Prescribed Dose Per Fraction: 1.8 Gy
Plan Total Fractions Prescribed: 25
Plan Total Prescribed Dose: 45 Gy
Reference Point Dosage Given to Date: 18 Gy
Reference Point Session Dosage Given: 1.8 Gy
Session Number: 10

## 2022-04-10 ENCOUNTER — Ambulatory Visit
Admission: RE | Admit: 2022-04-10 | Discharge: 2022-04-10 | Disposition: A | Discharge status: Home / Self Care | Payer: 59 | Source: Ambulatory Visit | Attending: Radiation Oncology | Admitting: Radiation Oncology

## 2022-04-10 ENCOUNTER — Inpatient Hospital Stay (HOSPITAL_BASED_OUTPATIENT_CLINIC_OR_DEPARTMENT_OTHER): Payer: 59 | Admitting: Oncology

## 2022-04-10 ENCOUNTER — Inpatient Hospital Stay: Payer: 59

## 2022-04-10 ENCOUNTER — Encounter: Payer: Self-pay | Admitting: Oncology

## 2022-04-10 ENCOUNTER — Other Ambulatory Visit: Payer: Self-pay

## 2022-04-10 VITALS — BP 137/66 | HR 87 | Temp 96.8°F | Wt 247.9 lb

## 2022-04-10 DIAGNOSIS — C221 Intrahepatic bile duct carcinoma: Secondary | ICD-10-CM

## 2022-04-10 DIAGNOSIS — Z95828 Presence of other vascular implants and grafts: Secondary | ICD-10-CM

## 2022-04-10 DIAGNOSIS — N1832 Chronic kidney disease, stage 3b: Secondary | ICD-10-CM | POA: Diagnosis not present

## 2022-04-10 DIAGNOSIS — N1831 Chronic kidney disease, stage 3a: Secondary | ICD-10-CM

## 2022-04-10 DIAGNOSIS — C438 Malignant melanoma of overlapping sites of skin: Secondary | ICD-10-CM

## 2022-04-10 DIAGNOSIS — Z510 Encounter for antineoplastic radiation therapy: Secondary | ICD-10-CM | POA: Diagnosis not present

## 2022-04-10 DIAGNOSIS — D631 Anemia in chronic kidney disease: Secondary | ICD-10-CM

## 2022-04-10 LAB — RAD ONC ARIA SESSION SUMMARY
Course Elapsed Days: 18
Plan Fractions Treated to Date: 11
Plan Prescribed Dose Per Fraction: 1.8 Gy
Plan Total Fractions Prescribed: 25
Plan Total Prescribed Dose: 45 Gy
Reference Point Dosage Given to Date: 19.8 Gy
Reference Point Session Dosage Given: 1.8 Gy
Session Number: 11

## 2022-04-10 LAB — COMPREHENSIVE METABOLIC PANEL
ALT: 28 U/L (ref 0–44)
AST: 28 U/L (ref 15–41)
Albumin: 3.8 g/dL (ref 3.5–5.0)
Alkaline Phosphatase: 68 U/L (ref 38–126)
Anion gap: 7 (ref 5–15)
BUN: 24 mg/dL — ABNORMAL HIGH (ref 8–23)
CO2: 21 mmol/L — ABNORMAL LOW (ref 22–32)
Calcium: 8.5 mg/dL — ABNORMAL LOW (ref 8.9–10.3)
Chloride: 109 mmol/L (ref 98–111)
Creatinine, Ser: 1.44 mg/dL — ABNORMAL HIGH (ref 0.61–1.24)
GFR, Estimated: 55 mL/min — ABNORMAL LOW (ref >60)
Glucose, Bld: 110 mg/dL — ABNORMAL HIGH (ref 70–99)
Potassium: 4.3 mmol/L (ref 3.5–5.1)
Sodium: 137 mmol/L (ref 135–145)
Total Bilirubin: 0.4 mg/dL (ref 0.3–1.2)
Total Protein: 6.6 g/dL (ref 6.5–8.1)

## 2022-04-10 LAB — CBC WITH DIFFERENTIAL/PLATELET
Abs Immature Granulocytes: 0.03 10*3/uL (ref 0.00–0.07)
Basophils Absolute: 0 10*3/uL (ref 0.0–0.1)
Basophils Relative: 0 %
Eosinophils Absolute: 0.1 10*3/uL (ref 0.0–0.5)
Eosinophils Relative: 3 %
HCT: 34.1 % — ABNORMAL LOW (ref 39.0–52.0)
Hemoglobin: 11.3 g/dL — ABNORMAL LOW (ref 13.0–17.0)
Immature Granulocytes: 1 %
Lymphocytes Relative: 10 %
Lymphs Abs: 0.4 10*3/uL — ABNORMAL LOW (ref 0.7–4.0)
MCH: 30.4 pg (ref 26.0–34.0)
MCHC: 33.1 g/dL (ref 30.0–36.0)
MCV: 91.7 fL (ref 80.0–100.0)
Monocytes Absolute: 0.5 10*3/uL (ref 0.1–1.0)
Monocytes Relative: 11 %
Neutro Abs: 3.1 10*3/uL (ref 1.7–7.7)
Neutrophils Relative %: 75 %
Platelets: 149 10*3/uL — ABNORMAL LOW (ref 150–400)
RBC: 3.72 MIL/uL — ABNORMAL LOW (ref 4.22–5.81)
RDW: 13.6 % (ref 11.5–15.5)
WBC: 4.1 10*3/uL (ref 4.0–10.5)
nRBC: 0 % (ref 0.0–0.2)

## 2022-04-10 MED ORDER — HEPARIN SOD (PORK) LOCK FLUSH 100 UNIT/ML IV SOLN
500.0000 [IU] | Freq: Once | INTRAVENOUS | Status: AC
  Administered 2022-04-10: 500 [IU] via INTRAVENOUS
  Filled 2022-04-10: qty 5

## 2022-04-10 MED ORDER — SODIUM CHLORIDE 0.9% FLUSH
10.0000 mL | Freq: Once | INTRAVENOUS | Status: AC
  Administered 2022-04-10: 10 mL via INTRAVENOUS
  Filled 2022-04-10: qty 10

## 2022-04-10 NOTE — Assessment & Plan Note (Signed)
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  Proceed with concurrent chemotherapy Xeloda '825mg'$ /m2 BID on his radiation days.  After concurrent chemo RT plan additional 4 months of Xeloda [1000 mg/m2 BID for 14 of every 21 days]  Labs are reviewed and discussed with patient. He tolerates treatment so far. Continue concurrent chemotherapy Xeloda w RT Patient has upcoming image scan arranged in February at [facility].

## 2022-04-10 NOTE — Assessment & Plan Note (Signed)
Continue monitor

## 2022-04-10 NOTE — Progress Notes (Signed)
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  Proceed with concurrent chemotherapy Xeloda '825mg'$ /m2 BID on his radiation days.  After concurrent chemo RT plan additional 4 months of Xeloda [1000 mg/m2 BID for 14 of every 21 days]  Labs are reviewed and discussed with patient. He tolerates treatment so far. Continue concurrent chemotherapy Xeloda w RT Patient has upcoming image scan arranged in February at [facility].  Anemia in chronic kidney disease Continue monitor.   Malignant melanoma of overlapping sites [facility]) Patient has no radiographic evidence of metastatic melanoma. Off immunotherapy. Continue surveillance  Stage 3a chronic kidney disease ([place]) avoid nephrotoxins.   creatinine fluctuates. Encourage oral hydration.    Orders Placed This Encounter  Procedures   CBC with Differential/Platelet    Standing Status:   Future    Standing Expiration Date:   04/11/2023   Comprehensive metabolic panel    Standing Status:   Future    Standing Expiration Date:   04/10/2023    Follow-up in 10 days  All questions  were answered. The patient knows to call the clinic with any problems, questions or concerns.  [name], MD, PhD [facility] Hematology Oncology 04/10/2022   CHIEF COMPLAINTS/REASON FOR VISIT:  Follow up for melanoma HISTORY OF PRESENTING ILLNESS:   Edward Pitts is a  62 y.o.  male presents for recurrent malignant melanoma.   Oncology History  Malignant melanoma of overlapping sites [facility])  04/16/2019 Cancer Staging   Staging form: Melanoma of the Skin, AJCC 8th Edition - Pathologic: Stage Unknown (rpTX, pN1b, cM0) - Signed by [name], MD on 07/27/2020 Stage prefix: Recurrence    04/23/2019 Initial Diagnosis   Malignant melanoma   -He has a history of left lower extremity melanoma in 2011, status post local excision -04/16/2019 patient underwent left groin mass resection  Resection pathology showed malignant melanoma, replacing a lymph node, with extracapsular extension, peripheral and deep margins involved.  Left inguinal contents, all 7 lymph nodes were negative for melanoma in the lymph nodes. Extranodal melanoma identified in lymphatic and interstitium between nodes -PDL1 80% TPS    07/07/2019 -  Radiation Therapy   status post adjuvant radiation.   07/23/2019 - 07/21/2021 Chemotherapy   Nivolumab q14d      06/29/2020 Imaging   CT chest abdomen pelvis showed stable postoperative appearance of the left groin.  No evidence of local recurrence.  No evidence of metastatic disease in the chest abdomen or pelvis.  Hepatic steatosis.  Stable subcentimeter fluid attenuation lesion of the lateral right lobe of the liver, likely benign cyst or hemangioma.  Coronary artery disease.  Aortic atherosclerosis   10/20/2020 Imaging   CT chest abdomen pelvis showed stable postoperative/radiation appearance of the  left groin.  No evidence of local recurrence/metastatic disease within the chest abdomen/pelvis.  Fatty liver disease.  Diverticulosis without evidence of typhlitis.  Aortic atherosclerosis    03/10/2021 Imaging   MRI brain without contrast showed no definitive evidence of intracranial metastatic disease.  Study is limited by absence of intravenous contrast.     04/26/2021 Imaging   CT chest abdomen pelvis without contrast showed stable post operative changes of left groin with no evidence of recurrent disease.  No evidence of metastatic disease in the chest abdomen pelvis.  Aortic atherosclerosis   08/08/2021 - 08/16/2021 Hospital Admission    patient was hospitalized due to NSTEMI status post CABG x3.  He also had pacemaker The echocardiogram showed left ventricular ejection fraction of 65 to 70%,   09/15/2021 Imaging   CT chest abdomen pelvis w contrast  IMPRESSION: 1. Subtle hypodense 9 mm lesion in the left lobe of the liver is new from prior imaging including previous contrasted CT dating back to December 10, 2019, with the lesion appearing to equilibrate with background liver on delayed imaging sequence but is incompletely evaluated on this imaging study and technically nonspecific possibly reflecting a benign perfusional variant and while its appearance is not typical for that of a melanoma metastasis, it is not excluded on this examination. Suggest more definitive characterization by hepatic protocol MRI with and without contrast. 2. Stable postoperative changes in the left groin without evidence of local recurrent disease.3. No evidence of metastatic disease in the chest or pelvis.4.  Aortic Atherosclerosis (ICD10-I70.0).      09/23/2021 Imaging   Contrast-enhanced liver ultrasound Mildly hypoenhancing 2.2 cm mass in the posterior aspect of the left lobe of the liver with washout characteristics concerning for non hepatocellular malignancy, concerning for melanotic metastasis given history. The lesion is in an unfavorable location for percutaneous biopsy. Consider PET-CT for further characterization   10/21/2021 -  Chemotherapy   Nivolumab q14d      11/10/2021 Imaging    MRI Brain w wo contrast Negative for metastatic disease to the brain   11/10/2021 Imaging   MRI abdomen w wo contrast 1. Lesion of the posterior superior left lobe of the liver, hepatic segment II, measuring 1.9 x 1.8 cm corresponding to findings of prior imaging. Evaluation is somewhat limited breath motion artifact however there is subtle associated rim enhancement of this lesion. Findings are most in keeping with a hepatic metastasis in the setting of known recurrent melanoma. 2. Mild hepatic steatosis. 3. Cardiomegaly.    02/27/2022 -  Chemotherapy   Patient is on Treatment Plan : Capecitabine (825 mg/m2 bid) + XRT     Cholangiocarcinoma ([place])  12/19/2021 Initial Diagnosis   Liver biopsy showed carcinoma  -The slides on the patient's prior left inguinal lymph node biopsy ([id number]) with metastatic melanoma were reviewed in conjunction with  this case. The morphology of the malignant cells between the two cases are dissimilar. A limited panel of immunohistochemical stains was performed and the neoplastic cells are positive for superpancytokeratin, cytokeratin 7 (diffuse, strong), cytokeratin 20 (patchy, moderate) and negative for S100, SOX-10, and HepPar-1.These findings are consistent with carcinoma. The morphologic findings and pattern of immunohistochemical staining are non-specific and possible sites of origin include but are not limited to pancreaticobiliary tract, GI tract, prostate, kidney, lung, and breast. Per [facility], the patient had a PET scan performed in July 2023 which demonstrated a single hepatic  hypermetabolic lesion without other sites of disease.    01/19/2022 Surgery   Liver  lesion resection at [facility] by Dr.Zani  A.  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  Tumor extends to hepatic parenchymal margin, but all other margins are negative for tumor. See synoptic report and comment.   Background liver  with: Mild steatosis (20%).  No definitive evidence of steatohepatitis.   Mild periportal fibrosis (trichrome and reticulin). No stainable iron (Prussian blue stain).  No evidence to support alpha-1-antitrypsin deficiency on PAS-D stain.   pT1a pNx   01/25/2022 Cancer Staging   Staging form: Intrahepatic Bile Duct, AJCC 8th Edition - Pathologic stage from 01/25/2022: Stage Unknown (pT1a, pNX, cM0) - Signed by [name], MD on 02/21/2022 Stage prefix: Initial diagnosis    04/18/2021, patient establish care with neurology Dr. Mickeal Skinner for intermittent altered cognition.  he was recommended to start Vimpat.    INTERVAL HISTORY Yaziel Brandon is a 62 y.o. male who has above history reviewed by me today presents for follow up visit for management of inguinal nodal recurrence of melanoma, resected T1a cholangiocarcinoma.  He is on concurrent chemotherapy Xeloda with radiation. He tolerates well. Denies any nausea, vomiting, diarrhea.    :Review of Systems  Constitutional:  Negative for appetite change, chills, fatigue, fever and unexpected weight change.  HENT:   Negative for hearing loss and voice change.   Eyes:  Negative for eye problems and icterus.  Respiratory:  Negative for chest tightness, cough and shortness of breath.   Cardiovascular:  Positive for leg swelling. Negative for chest pain.  Gastrointestinal:  Negative for abdominal distention and abdominal pain.  Endocrine: Negative for hot flashes.  Genitourinary:  Negative for difficulty urinating, dysuria and frequency.   Musculoskeletal:        Status post left hip replacement  Skin:  Negative for itching and rash.       Skin hypo-pigmentation on upper extremities, no change  Neurological:  Negative for light-headedness and numbness.       Spells  Hematological:  Negative for adenopathy. Does not bruise/bleed easily.  Psychiatric/Behavioral:  Negative for confusion.     MEDICAL HISTORY:  Past Medical History:  Diagnosis Date    Anemia    iron treatments   Anxiety    Aortic atherosclerosis (H[facility])    Arthritis    Cancer of groin ([place]) 2021   left groin, resected, radiation   Cataract    Complication of anesthesia    PONV   Coronary artery disease    Dizziness of unknown etiology    has led to seizures and passing out.   Family history of adverse reaction to anesthesia    PONV mother   GERD (gastroesophageal reflux disease)    History of complete heart block    PPM placed   Hyperlipidemia    Hypertension    LBBB (left bundle branch block)    Lymphedema of left leg    uses thigh high compression stockings   Melanoma ([place]) 2012   skin cancer, left thigh   OSA on CPAP    PONV (postoperative nausea and vomiting) 04/16/2019   Port-A-Cath in place    RIGHT chest wall   Presence of cardiac pacemaker    [employer]   Seizures ([place])    still has episodes of dizziness. last event 1 month ago (march 2022) and will pass out. takes clonazepam    SURGICAL HISTORY: Past Surgical History:  Procedure Laterality Date   CORONARY ARTERY BYPASS GRAFT N/A 08/10/2021   Procedure: CORONARY  ARTERY BYPASS GRAFTING (CABG) X 3 USING LEFT INTERNAL MAMMARY ARTERY AND RIGHT GREATER SAPHENOUS VEIN;  Surgeon: Dahlia Byes, MD;  Location: [place];  Service: Open Heart Surgery;  Laterality: N/A;   CT RADIATION THERAPY GUIDE     left groin   dental implant     permanent implant   ENDOVEIN HARVEST OF GREATER SAPHENOUS VEIN Right 08/10/2021   Procedure: ENDOVEIN HARVEST OF GREATER SAPHENOUS VEIN;  Surgeon: Dahlia Byes, MD;  Location: [place];  Service: Open Heart Surgery;  Laterality: Right;   KNEE SURGERY Left    arthroscopy   LEFT HEART CATH AND CORONARY ANGIOGRAPHY Left 06/29/2017   Procedure: LEFT HEART CATH AND CORONARY ANGIOGRAPHY;  Surgeon: [name], MD;  Location: [place] [facility];  Service: Cardiovascular;  Laterality: Left;   LEFT HEART CATH AND CORONARY ANGIOGRAPHY N/A 08/08/2021   Procedure: LEFT HEART CATH  AND CORONARY ANGIOGRAPHY;  Surgeon: [name], MD;  Location: [place] [facility];  Service: Cardiovascular;  Laterality: N/A;   LYMPH NODE DISSECTION Left 04/16/2019   Procedure: Left inguinal Lymph Node Dissection;  Surgeon: [name], MD;  Location: [place];  Service: General;  Laterality: Left;   MELANOMA EXCISION Left 04/16/2019   Procedure: MELANOMA EXCISION LEFT GROIN MASS;  Surgeon: [name], MD;  Location: [place];  Service: General;  Laterality: Left;   MELANOMA EXCISION WITH SENTINEL LYMPH NODE BIOPSY Left 2012   Left calf    PACEMAKER INSERTION N/A 08/26/2018   Procedure: INSERTION PACEMAKER;  Surgeon: [name], MD;  Location: [facility];  Service: Cardiovascular;  Laterality: N/A;   PORTA CATH INSERTION N/A 08/26/2019   Procedure: PORTA CATH INSERTION;  Surgeon: Katha Cabal, MD;  Location: [place] [facility];  Service: Cardiovascular;  Laterality: N/A;   SUPERFICIAL LYMPH NODE BIOPSY / EXCISION Left 2020   lymph nodes removed around left groin melanoma site   TEE WITHOUT CARDIOVERSION N/A 08/10/2021   Procedure: TRANSESOPHAGEAL ECHOCARDIOGRAM (TEE);  Surgeon: Dahlia Byes, MD;  Location: [place];  Service: Open Heart Surgery;  Laterality: N/A;   TEMPORARY PACEMAKER N/A 08/25/2018   Procedure: TEMPORARY PACEMAKER;  Surgeon: [name], MD;  Location: [place] [facility];  Service: Cardiovascular;  Laterality: N/A;   TOTAL HIP ARTHROPLASTY Left 07/14/2020   Procedure: TOTAL HIP ARTHROPLASTY;  Surgeon: Dereck Leep, MD;  Location: [facility];  Service: Orthopedics;  Laterality: Left;    SOCIAL HISTORY: Social History   Socioeconomic History   Marital status: Married    Spouse name: Vicente Males    Number of children: 7   Years of education: 12   Highest education level: Not on file  Occupational History    Comment: disability  Tobacco Use   Smoking status: Never   Smokeless tobacco: Never  Vaping Use   Vaping Use: Never used  Substance and Sexual  Activity   Alcohol use: No   Drug use: No   Sexual activity: Not Currently  Other Topics Concern   Not on file  Social History Narrative   Lives with  Wife,   Has 2 small dogs   Caffeine use: sodas (2 per day)      Out of work on disability.  Has a walk in shower. No stairs to climb   Oncology treatment ongoing. Uses port a cath for treatment.      pacemaker   Social Determinants of Health   Financial Resource Strain: Low Risk  (02/12/2019)   Overall Emergency planning/management officer Strain (  CARDIA)    Difficulty of Paying Living Expenses: Not hard at all  Food Insecurity: Food Insecurity Present (02/21/2022)   Hunger Vital Sign    Worried About Running Out of Food in the Last Year: Sometimes true    Ran Out of Food in the Last Year: Often true  Transportation Needs: No Transportation Needs (02/21/2022)   PRAPARE - Hydrologist (Medical): No    Lack of Transportation (Non-Medical): No  Physical Activity: Unknown (02/12/2019)   Exercise Vital Sign    Days of Exercise per Week: 0 days    Minutes of Exercise per Session: Not on file  Stress: No Stress Concern Present (02/12/2019)   [place]    Feeling of Stress : Only a little  Social Connections: Unknown (02/12/2019)   Social Connection and Isolation Panel [NHANES]    Frequency of Communication with Friends and Family: More than three times a week    Frequency of Social Gatherings with Friends and Family: Not on file    Attends Religious Services: Not on file    Active Member of Clubs or Organizations: Not on file    Attends Archivist Meetings: Not on file    Marital Status: Married  Intimate Partner Violence: Not At Risk (02/21/2022)   Humiliation, Afraid, Rape, and Kick questionnaire    Fear of Current or Ex-Partner: No    Emotionally Abused: No    Physically Abused: No    Sexually Abused: No    FAMILY HISTORY: Family  History  Problem Relation Age of Onset   Cancer Paternal Grandmother     ALLERGIES:  is allergic to ibuprofen, levetiracetam, and nsaids.  MEDICATIONS:  Current Outpatient Medications  Medication Sig Dispense Refill   apixaban (ELIQUIS) 5 MG TABS tablet Take 5 mg by mouth 2 (two) times daily.     aspirin 81 MG EC tablet Take 81 mg by mouth daily.     atorvastatin (LIPITOR) 20 MG tablet Take 20 mg by mouth daily.     calcium-vitamin D (OSCAL WITH D) 500-5 MG-MCG tablet Take 1 tablet by mouth daily. 30 tablet 0   capecitabine (XELODA) 150 MG tablet Take 2 tablets (300 mg total) by mouth 2 (two) times daily after a meal. Take Monday-Friday. Take only on days of radiation. Take along with '500mg'$  tablets. 100 tablet 0   capecitabine (XELODA) 500 MG tablet Take 3 tablets (1,500 mg total) by mouth 2 (two) times daily after a meal. Take Monday-Friday. Take only on days of radiation. Take along with '150mg'$  tablets. 150 tablet 0   Carboxymeth-Glyc-Polysorb PF (REFRESH OPTIVE MEGA-3) 0.5-1-0.5 % SOLN Place 1 drop into both eyes daily as needed (dry eyes).     clonazePAM (KLONOPIN) 0.5 MG tablet TAKE 1 TABLET BY MOUTH IN THE MORNING AND 2 TABS BY MOUTH IN THE EVENING 90 tablet 0   febuxostat (ULORIC) 40 MG tablet Take 40 mg by mouth daily.     ferrous sulfate 325 (65 FE) MG EC tablet TAKE 1 TABLET BY MOUTH 2 TIMES DAILY WITH A MEAL. 180 tablet 1   hydrochlorothiazide (HYDRODIURIL) 25 MG tablet Take 1 tablet (25 mg total) by mouth daily. 30 tablet 0   hydrocortisone 2.5 % ointment Apply 1 application. topically daily as needed (itching).     Lacosamide 100 MG TABS TAKE 1 TABLET (100 MG TOTAL) BY MOUTH IN THE MORNING AND AT BEDTIME. 60 tablet 3   lidocaine-prilocaine (  EMLA) cream Apply 1 application. topically as needed. Apply small amount of cream to port site approx 1-2 hours prior to appointment. 30 g 11   lisinopril (ZESTRIL) 20 MG tablet Take 1 tablet by mouth daily.     metoprolol succinate  (TOPROL-XL) 25 MG 24 hr tablet Take 25 mg by mouth daily.     Multiple Vitamin (MULTIVITAMIN WITH MINERALS) TABS tablet Take 1 tablet by mouth daily. Centrum Silver     omeprazole (PRILOSEC) 20 MG capsule TAKE 1 CAPSULE BY MOUTH EVERY DAY 90 capsule 0   ondansetron (ZOFRAN) 4 MG tablet TAKE 1 TABLET BY MOUTH EVERY 8 HOURS AS NEEDED FOR NAUSEA AND VOMITING 90 tablet 1   traZODone (DESYREL) 50 MG tablet TAKE 1 TABLET BY MOUTH AT BEDTIME AS NEEDED FOR SLEEP. 30 tablet 0   No current facility-administered medications for this visit.   Facility-Administered Medications Ordered in Other Visits  Medication Dose Route Frequency Provider Last Rate Last Admin   heparin lock flush 100 UNIT/ML injection            heparin lock flush 100 UNIT/ML injection              PHYSICAL EXAMINATION: ECOG PERFORMANCE STATUS: 1 - Symptomatic but completely ambulatory Vitals:   04/10/22 0907  BP: 137/66  Pulse: 87  Temp: (!) 96.8 F (36 C)  SpO2: 96%   Filed Weights   04/10/22 0907  Weight: 247 lb 14.4 oz (112.4 kg)    Physical Exam Constitutional:      General: He is not in acute distress.    Comments: Patient ambulates independently  HENT:     Head: Normocephalic and atraumatic.  Eyes:     General: No scleral icterus.    Pupils: Pupils are equal, round, and reactive to light.  Cardiovascular:     Rate and Rhythm: Normal rate and regular rhythm.     Heart sounds: Normal heart sounds.  Pulmonary:     Effort: Pulmonary effort is normal. No respiratory distress.     Breath sounds: No wheezing.  Abdominal:     General: Bowel sounds are normal. There is no distension.     Palpations: Abdomen is soft. There is no mass.     Tenderness: There is no abdominal tenderness.     Comments:    Musculoskeletal:        General: No deformity. Normal range of motion.     Cervical back: Normal range of motion and neck supple.     Comments: Left lower extremity edema-chronic   Skin:    General: Skin is  warm and dry.  Neurological:     Mental Status: He is alert and oriented to person, place, and time. Mental status is at baseline.     Cranial Nerves: No cranial nerve deficit.     Coordination: Coordination normal.  Psychiatric:        Mood and Affect: Mood normal.     LABORATORY DATA:  I have reviewed the data as listed    Latest Ref Rng & Units 04/10/2022    8:49 AM 03/30/2022    9:37 AM 03/17/2022   11:11 AM  CBC  WBC 4.0 - 10.5 K/uL 4.1  4.4  5.0   Hemoglobin 13.0 - 17.0 g/dL 11.3  11.9  12.7   Hematocrit 39.0 - 52.0 % 34.1  35.0  38.4   Platelets 150 - 400 K/uL 149  180  218       Latest Ref  Rng & Units 04/10/2022    8:49 AM 03/30/2022    9:37 AM 03/23/2022    8:32 AM  CMP  Glucose 70 - 99 mg/dL 110  138  142   BUN 8 - 23 mg/dL 24  32  38   Creatinine 0.61 - 1.24 mg/dL 1.44  1.49  1.62   Sodium 135 - 145 mmol/L 137  137  139   Potassium 3.5 - 5.1 mmol/L 4.3  4.4  4.6   Chloride 98 - 111 mmol/L 109  109  110   CO2 22 - 32 mmol/L '21  23  21   '$ Calcium 8.9 - 10.3 mg/dL 8.5  8.7  8.9   Total Protein 6.5 - 8.1 g/dL 6.6  6.9    Total Bilirubin 0.3 - 1.2 mg/dL 0.4  0.6    Alkaline Phos 38 - 126 U/L 68  67    AST 15 - 41 U/L 28  24    ALT 0 - 44 U/L 28  27       RADIOGRAPHIC STUDIES: I have personally reviewed the radiological images as listed and agreed with the findings in the report. DG Chest Portable 1 View  Result Date: 02/21/2022 CLINICAL DATA:  Shortness of breath EXAM: PORTABLE CHEST 1 VIEW COMPARISON:  09/14/2021 FINDINGS: Left-sided implanted cardiac device and right chest port remain in place. Stable heart size status post sternotomy and CABG. No focal airspace consolidation, pleural effusion, or pneumothorax. IMPRESSION: No active disease. Electronically Signed   By: Davina Poke D.O.   On: 02/21/2022 13:43

## 2022-04-10 NOTE — Assessment & Plan Note (Signed)
Patient has no radiographic evidence of metastatic melanoma. Off immunotherapy. Continue surveillance 

## 2022-04-10 NOTE — Assessment & Plan Note (Signed)
avoid nephrotoxins.   creatinine fluctuates. Encourage oral hydration.

## 2022-04-11 ENCOUNTER — Ambulatory Visit
Admission: RE | Admit: 2022-04-11 | Discharge: 2022-04-11 | Disposition: A | Discharge status: Home / Self Care | Payer: 59 | Source: Ambulatory Visit | Attending: Radiation Oncology | Admitting: Radiation Oncology

## 2022-04-11 ENCOUNTER — Other Ambulatory Visit: Payer: Self-pay

## 2022-04-11 DIAGNOSIS — C221 Intrahepatic bile duct carcinoma: Secondary | ICD-10-CM | POA: Diagnosis not present

## 2022-04-11 DIAGNOSIS — Z510 Encounter for antineoplastic radiation therapy: Secondary | ICD-10-CM | POA: Diagnosis not present

## 2022-04-11 DIAGNOSIS — Z79899 Other long term (current) drug therapy: Secondary | ICD-10-CM | POA: Diagnosis not present

## 2022-04-11 DIAGNOSIS — M1A00X0 Idiopathic chronic gout, unspecified site, without tophus (tophi): Secondary | ICD-10-CM | POA: Diagnosis not present

## 2022-04-11 DIAGNOSIS — N1831 Chronic kidney disease, stage 3a: Secondary | ICD-10-CM | POA: Diagnosis not present

## 2022-04-11 DIAGNOSIS — C438 Malignant melanoma of overlapping sites of skin: Secondary | ICD-10-CM | POA: Diagnosis not present

## 2022-04-11 LAB — RAD ONC ARIA SESSION SUMMARY
Course Elapsed Days: 19
Plan Fractions Treated to Date: 12
Plan Prescribed Dose Per Fraction: 1.8 Gy
Plan Total Fractions Prescribed: 25
Plan Total Prescribed Dose: 45 Gy
Reference Point Dosage Given to Date: 21.6 Gy
Reference Point Session Dosage Given: 1.8 Gy
Session Number: 12

## 2022-04-12 ENCOUNTER — Ambulatory Visit
Admission: RE | Admit: 2022-04-12 | Discharge: 2022-04-12 | Disposition: A | Discharge status: Home / Self Care | Payer: 59 | Source: Ambulatory Visit | Attending: Radiation Oncology | Admitting: Radiation Oncology

## 2022-04-12 ENCOUNTER — Other Ambulatory Visit: Payer: Self-pay

## 2022-04-12 DIAGNOSIS — N1831 Chronic kidney disease, stage 3a: Secondary | ICD-10-CM | POA: Diagnosis not present

## 2022-04-12 DIAGNOSIS — C438 Malignant melanoma of overlapping sites of skin: Secondary | ICD-10-CM | POA: Diagnosis not present

## 2022-04-12 DIAGNOSIS — C221 Intrahepatic bile duct carcinoma: Secondary | ICD-10-CM | POA: Diagnosis not present

## 2022-04-12 DIAGNOSIS — Z510 Encounter for antineoplastic radiation therapy: Secondary | ICD-10-CM | POA: Diagnosis not present

## 2022-04-12 LAB — RAD ONC ARIA SESSION SUMMARY
Course Elapsed Days: 20
Plan Fractions Treated to Date: 13
Plan Prescribed Dose Per Fraction: 1.8 Gy
Plan Total Fractions Prescribed: 25
Plan Total Prescribed Dose: 45 Gy
Reference Point Dosage Given to Date: 23.4 Gy
Reference Point Session Dosage Given: 1.8 Gy
Session Number: 13

## 2022-04-13 ENCOUNTER — Ambulatory Visit
Admission: RE | Admit: 2022-04-13 | Discharge: 2022-04-13 | Disposition: A | Discharge status: Home / Self Care | Payer: 59 | Source: Ambulatory Visit | Attending: Radiation Oncology | Admitting: Radiation Oncology

## 2022-04-13 ENCOUNTER — Other Ambulatory Visit: Payer: Self-pay

## 2022-04-13 DIAGNOSIS — C221 Intrahepatic bile duct carcinoma: Secondary | ICD-10-CM | POA: Diagnosis not present

## 2022-04-13 DIAGNOSIS — Z510 Encounter for antineoplastic radiation therapy: Secondary | ICD-10-CM | POA: Diagnosis not present

## 2022-04-13 DIAGNOSIS — C438 Malignant melanoma of overlapping sites of skin: Secondary | ICD-10-CM | POA: Diagnosis not present

## 2022-04-13 DIAGNOSIS — N1831 Chronic kidney disease, stage 3a: Secondary | ICD-10-CM | POA: Diagnosis not present

## 2022-04-13 LAB — RAD ONC ARIA SESSION SUMMARY
Course Elapsed Days: 21
Plan Fractions Treated to Date: 14
Plan Prescribed Dose Per Fraction: 1.8 Gy
Plan Total Fractions Prescribed: 25
Plan Total Prescribed Dose: 45 Gy
Reference Point Dosage Given to Date: 25.2 Gy
Reference Point Session Dosage Given: 1.8 Gy
Session Number: 14

## 2022-04-14 ENCOUNTER — Other Ambulatory Visit: Payer: Self-pay

## 2022-04-14 ENCOUNTER — Ambulatory Visit
Admission: RE | Admit: 2022-04-14 | Discharge: 2022-04-14 | Disposition: A | Discharge status: Home / Self Care | Payer: 59 | Source: Ambulatory Visit | Attending: Radiation Oncology | Admitting: Radiation Oncology

## 2022-04-14 DIAGNOSIS — Z510 Encounter for antineoplastic radiation therapy: Secondary | ICD-10-CM | POA: Diagnosis not present

## 2022-04-14 DIAGNOSIS — C221 Intrahepatic bile duct carcinoma: Secondary | ICD-10-CM | POA: Diagnosis not present

## 2022-04-14 DIAGNOSIS — C438 Malignant melanoma of overlapping sites of skin: Secondary | ICD-10-CM | POA: Diagnosis not present

## 2022-04-14 DIAGNOSIS — N1831 Chronic kidney disease, stage 3a: Secondary | ICD-10-CM | POA: Diagnosis not present

## 2022-04-14 LAB — RAD ONC ARIA SESSION SUMMARY
Course Elapsed Days: 22
Plan Fractions Treated to Date: 15
Plan Prescribed Dose Per Fraction: 1.8 Gy
Plan Total Fractions Prescribed: 25
Plan Total Prescribed Dose: 45 Gy
Reference Point Dosage Given to Date: 27 Gy
Reference Point Session Dosage Given: 1.8 Gy
Session Number: 15

## 2022-04-17 ENCOUNTER — Ambulatory Visit: Payer: 59

## 2022-04-18 ENCOUNTER — Ambulatory Visit
Admission: RE | Admit: 2022-04-18 | Discharge: 2022-04-18 | Disposition: A | Discharge status: Home / Self Care | Payer: 59 | Source: Ambulatory Visit | Attending: Radiation Oncology | Admitting: Radiation Oncology

## 2022-04-18 ENCOUNTER — Other Ambulatory Visit: Payer: Self-pay

## 2022-04-18 DIAGNOSIS — N1831 Chronic kidney disease, stage 3a: Secondary | ICD-10-CM | POA: Diagnosis not present

## 2022-04-18 DIAGNOSIS — Z510 Encounter for antineoplastic radiation therapy: Secondary | ICD-10-CM | POA: Diagnosis not present

## 2022-04-18 DIAGNOSIS — C438 Malignant melanoma of overlapping sites of skin: Secondary | ICD-10-CM | POA: Diagnosis not present

## 2022-04-18 DIAGNOSIS — C221 Intrahepatic bile duct carcinoma: Secondary | ICD-10-CM | POA: Diagnosis not present

## 2022-04-18 LAB — RAD ONC ARIA SESSION SUMMARY
Course Elapsed Days: 26
Plan Fractions Treated to Date: 16
Plan Prescribed Dose Per Fraction: 1.8 Gy
Plan Total Fractions Prescribed: 25
Plan Total Prescribed Dose: 45 Gy
Reference Point Dosage Given to Date: 28.8 Gy
Reference Point Session Dosage Given: 1.8 Gy
Session Number: 16

## 2022-04-19 ENCOUNTER — Other Ambulatory Visit: Payer: Self-pay

## 2022-04-19 ENCOUNTER — Ambulatory Visit
Admission: RE | Admit: 2022-04-19 | Discharge: 2022-04-19 | Disposition: A | Discharge status: Home / Self Care | Payer: 59 | Source: Ambulatory Visit | Attending: Radiation Oncology | Admitting: Radiation Oncology

## 2022-04-19 DIAGNOSIS — Z510 Encounter for antineoplastic radiation therapy: Secondary | ICD-10-CM | POA: Diagnosis not present

## 2022-04-19 DIAGNOSIS — N1831 Chronic kidney disease, stage 3a: Secondary | ICD-10-CM | POA: Diagnosis not present

## 2022-04-19 DIAGNOSIS — C438 Malignant melanoma of overlapping sites of skin: Secondary | ICD-10-CM | POA: Diagnosis not present

## 2022-04-19 DIAGNOSIS — C221 Intrahepatic bile duct carcinoma: Secondary | ICD-10-CM | POA: Diagnosis not present

## 2022-04-19 LAB — RAD ONC ARIA SESSION SUMMARY
Course Elapsed Days: 27
Plan Fractions Treated to Date: 17
Plan Prescribed Dose Per Fraction: 1.8 Gy
Plan Total Fractions Prescribed: 25
Plan Total Prescribed Dose: 45 Gy
Reference Point Dosage Given to Date: 30.6 Gy
Reference Point Session Dosage Given: 1.8 Gy
Session Number: 17

## 2022-04-20 ENCOUNTER — Inpatient Hospital Stay: Payer: 59

## 2022-04-20 ENCOUNTER — Encounter: Payer: Self-pay | Admitting: Oncology

## 2022-04-20 ENCOUNTER — Other Ambulatory Visit: Payer: Self-pay

## 2022-04-20 ENCOUNTER — Inpatient Hospital Stay (HOSPITAL_BASED_OUTPATIENT_CLINIC_OR_DEPARTMENT_OTHER): Payer: 59 | Admitting: Oncology

## 2022-04-20 ENCOUNTER — Ambulatory Visit
Admission: RE | Admit: 2022-04-20 | Discharge: 2022-04-20 | Disposition: A | Discharge status: Home / Self Care | Payer: 59 | Source: Ambulatory Visit | Attending: Radiation Oncology | Admitting: Radiation Oncology

## 2022-04-20 VITALS — BP 128/73 | HR 83 | Temp 97.2°F | Wt 249.0 lb

## 2022-04-20 DIAGNOSIS — N1831 Chronic kidney disease, stage 3a: Secondary | ICD-10-CM | POA: Diagnosis not present

## 2022-04-20 DIAGNOSIS — D631 Anemia in chronic kidney disease: Secondary | ICD-10-CM | POA: Diagnosis not present

## 2022-04-20 DIAGNOSIS — Z510 Encounter for antineoplastic radiation therapy: Secondary | ICD-10-CM | POA: Diagnosis not present

## 2022-04-20 DIAGNOSIS — N1832 Chronic kidney disease, stage 3b: Secondary | ICD-10-CM | POA: Diagnosis not present

## 2022-04-20 DIAGNOSIS — Z5111 Encounter for antineoplastic chemotherapy: Secondary | ICD-10-CM

## 2022-04-20 DIAGNOSIS — C221 Intrahepatic bile duct carcinoma: Secondary | ICD-10-CM

## 2022-04-20 DIAGNOSIS — C438 Malignant melanoma of overlapping sites of skin: Secondary | ICD-10-CM | POA: Diagnosis not present

## 2022-04-20 LAB — CBC WITH DIFFERENTIAL/PLATELET
Abs Immature Granulocytes: 0.03 10*3/uL (ref 0.00–0.07)
Basophils Absolute: 0 10*3/uL (ref 0.0–0.1)
Basophils Relative: 0 %
Eosinophils Absolute: 0.1 10*3/uL (ref 0.0–0.5)
Eosinophils Relative: 3 %
HCT: 35.3 % — ABNORMAL LOW (ref 39.0–52.0)
Hemoglobin: 11.4 g/dL — ABNORMAL LOW (ref 13.0–17.0)
Immature Granulocytes: 1 %
Lymphocytes Relative: 9 %
Lymphs Abs: 0.3 10*3/uL — ABNORMAL LOW (ref 0.7–4.0)
MCH: 31.3 pg (ref 26.0–34.0)
MCHC: 32.3 g/dL (ref 30.0–36.0)
MCV: 97 fL (ref 80.0–100.0)
Monocytes Absolute: 0.4 10*3/uL (ref 0.1–1.0)
Monocytes Relative: 12 %
Neutro Abs: 2.4 10*3/uL (ref 1.7–7.7)
Neutrophils Relative %: 75 %
Platelets: 143 10*3/uL — ABNORMAL LOW (ref 150–400)
RBC: 3.64 MIL/uL — ABNORMAL LOW (ref 4.22–5.81)
RDW: 14.6 % (ref 11.5–15.5)
WBC: 3.2 10*3/uL — ABNORMAL LOW (ref 4.0–10.5)
nRBC: 0 % (ref 0.0–0.2)

## 2022-04-20 LAB — RAD ONC ARIA SESSION SUMMARY
Course Elapsed Days: 28
Plan Fractions Treated to Date: 18
Plan Prescribed Dose Per Fraction: 1.8 Gy
Plan Total Fractions Prescribed: 25
Plan Total Prescribed Dose: 45 Gy
Reference Point Dosage Given to Date: 32.4 Gy
Reference Point Session Dosage Given: 1.8 Gy
Session Number: 18

## 2022-04-20 LAB — COMPREHENSIVE METABOLIC PANEL
ALT: 30 U/L (ref 0–44)
AST: 29 U/L (ref 15–41)
Albumin: 4.1 g/dL (ref 3.5–5.0)
Alkaline Phosphatase: 68 U/L (ref 38–126)
Anion gap: 7 (ref 5–15)
BUN: 36 mg/dL — ABNORMAL HIGH (ref 8–23)
CO2: 22 mmol/L (ref 22–32)
Calcium: 9.3 mg/dL (ref 8.9–10.3)
Chloride: 110 mmol/L (ref 98–111)
Creatinine, Ser: 1.42 mg/dL — ABNORMAL HIGH (ref 0.61–1.24)
GFR, Estimated: 56 mL/min — ABNORMAL LOW (ref >60)
Glucose, Bld: 138 mg/dL — ABNORMAL HIGH (ref 70–99)
Potassium: 4.6 mmol/L (ref 3.5–5.1)
Sodium: 139 mmol/L (ref 135–145)
Total Bilirubin: 0.5 mg/dL (ref 0.3–1.2)
Total Protein: 7.3 g/dL (ref 6.5–8.1)

## 2022-04-20 MED ORDER — HEPARIN SOD (PORK) LOCK FLUSH 100 UNIT/ML IV SOLN
500.0000 [IU] | Freq: Once | INTRAVENOUS | Status: AC
  Administered 2022-04-20: 500 [IU] via INTRAVENOUS
  Filled 2022-04-20: qty 5

## 2022-04-20 MED ORDER — SODIUM CHLORIDE 0.9% FLUSH
10.0000 mL | Freq: Once | INTRAVENOUS | Status: AC
  Administered 2022-04-20: 10 mL via INTRAVENOUS
  Filled 2022-04-20: qty 10

## 2022-04-20 NOTE — Assessment & Plan Note (Signed)
Chemotherapy plan as listed above 

## 2022-04-20 NOTE — Assessment & Plan Note (Signed)
Continue monitor

## 2022-04-20 NOTE — Assessment & Plan Note (Signed)
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  Proceed with concurrent chemotherapy Xeloda '825mg'$ /m2 BID on his radiation days.  After concurrent chemo RT plan additional 4 months of Xeloda [1000 mg/m2 BID for 14 of every 21 days]  Labs are reviewed and discussed with patient. He tolerates treatment so far. Continue concurrent chemotherapy Xeloda w RT Patient has upcoming image scan arranged in February at [facility].

## 2022-04-20 NOTE — Progress Notes (Signed)
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  Proceed with concurrent chemotherapy Xeloda '825mg'$ /m2 BID on his radiation days.  After concurrent chemo RT plan additional 4 months of Xeloda [1000 mg/m2 BID for 14 of every 21 days]  Labs are reviewed and discussed with patient. He tolerates treatment so far. Continue concurrent chemotherapy Xeloda w RT Patient has upcoming image scan arranged in February at [facility].  Anemia in chronic kidney disease Continue monitor.   Encounter for antineoplastic chemotherapy Chemotherapy plan as listed above.   Stage 3a chronic kidney disease (HCC) avoid nephrotoxins.   creatinine fluctuates. Encourage oral hydration.     Orders Placed This Encounter  Procedures   CBC with Differential/Platelet    Standing Status:   Future    Standing Expiration Date:   04/20/2023   Comprehensive metabolic panel    Standing Status:   Future    Standing Expiration Date:   04/20/2023    Follow-up in 10 days  All questions were answered. The patient knows to call the clinic with any problems,  questions or concerns.  [name], MD, PhD [facility] Hematology Oncology 04/20/2022   CHIEF COMPLAINTS/REASON FOR VISIT:  Follow up for melanoma HISTORY OF PRESENTING ILLNESS:   Edward Pitts is a  62 y.o.  male presents for recurrent malignant melanoma.   Oncology History  Malignant melanoma of overlapping sites [facility])  04/16/2019 Cancer Staging   Staging form: Melanoma of the Skin, AJCC 8th Edition - Pathologic: Stage Unknown (rpTX, pN1b, cM0) - Signed by [name], MD on 07/27/2020 Stage prefix: Recurrence    04/23/2019 Initial Diagnosis   Malignant melanoma   -He has a history of left lower extremity melanoma in 2011, status post local excision -04/16/2019 patient underwent left groin mass resection  Resection pathology showed malignant melanoma, replacing a lymph node, with extracapsular extension, peripheral and deep margins involved.  Left inguinal contents, all 7 lymph nodes were negative for melanoma in the lymph nodes. Extranodal melanoma identified in lymphatic and interstitium between nodes -PDL1 80% TPS    07/07/2019 -  Radiation Therapy   status post adjuvant radiation.   07/23/2019 - 07/21/2021 Chemotherapy   Nivolumab q14d      06/29/2020 Imaging   CT chest abdomen pelvis showed stable postoperative appearance of the left groin.  No evidence of local recurrence.  No evidence of metastatic disease in the chest abdomen or pelvis.  Hepatic steatosis.  Stable subcentimeter fluid attenuation lesion of the lateral right lobe of the liver, likely benign cyst or hemangioma.  Coronary artery disease.  Aortic atherosclerosis   10/20/2020 Imaging   CT chest abdomen pelvis showed stable postoperative/radiation appearance of the left groin.  No evidence of local  recurrence/metastatic disease within the chest abdomen/pelvis.  Fatty liver disease.  Diverticulosis without evidence of typhlitis.  Aortic atherosclerosis   03/10/2021 Imaging   MRI brain without contrast showed no definitive  evidence of intracranial metastatic disease.  Study is limited by absence of intravenous contrast.     04/26/2021 Imaging   CT chest abdomen pelvis without contrast showed stable post operative changes of left groin with no evidence of recurrent disease.  No evidence of metastatic disease in the chest abdomen pelvis.  Aortic atherosclerosis   08/08/2021 - 08/16/2021 Hospital Admission    patient was hospitalized due to NSTEMI status post CABG x3.  He also had pacemaker The echocardiogram showed left ventricular ejection fraction of 65 to 70%,   09/15/2021 Imaging   CT chest abdomen pelvis w contrast  IMPRESSION: 1. Subtle hypodense 9 mm lesion in the left lobe of the liver is new from prior imaging including previous contrasted CT dating back to December 10, 2019, with the lesion appearing to equilibrate with background liver on delayed imaging sequence but is incompletely evaluated on this imaging study and technically nonspecific possibly reflecting a benign perfusional variant and while its appearance is not typical for that of a melanoma metastasis, it is not excluded on this examination. Suggest more definitive characterization by hepatic protocol MRI with and without contrast. 2. Stable postoperative changes in the left groin without evidence of local recurrent disease.3. No evidence of metastatic disease in the chest or pelvis.4.  Aortic Atherosclerosis (ICD10-I70.0).      09/23/2021 Imaging   Contrast-enhanced liver ultrasound Mildly hypoenhancing 2.2 cm mass in the posterior aspect of the left lobe of the liver with washout characteristics concerning for non hepatocellular malignancy, concerning for melanotic metastasis given history. The lesion is in an unfavorable location for percutaneous biopsy. Consider PET-CT for further characterization   10/21/2021 -  Chemotherapy   Nivolumab q14d      11/10/2021 Imaging   MRI Brain w wo contrast Negative for metastatic disease to the brain    11/10/2021 Imaging   MRI abdomen w wo contrast 1. Lesion of the posterior superior left lobe of the liver, hepatic segment II, measuring 1.9 x 1.8 cm corresponding to findings of prior imaging. Evaluation is somewhat limited breath motion artifact however there is subtle associated rim enhancement of this lesion. Findings are most in keeping with a hepatic metastasis in the setting of known recurrent melanoma. 2. Mild hepatic steatosis. 3. Cardiomegaly.    02/27/2022 -  Chemotherapy   Patient is on Treatment Plan : Capecitabine (825 mg/m2 bid) + XRT     Cholangiocarcinoma ([place])  12/19/2021 Initial Diagnosis   Liver biopsy showed carcinoma  -The slides on the patient's prior left inguinal lymph node biopsy ([id number]) with metastatic melanoma were reviewed in conjunction with  this case. The morphology of the malignant cells between the two cases are dissimilar. A limited panel of immunohistochemical stains was performed and the neoplastic cells are positive for superpancytokeratin, cytokeratin 7 (diffuse, strong), cytokeratin 20 (patchy, moderate) and negative for S100, SOX-10, and HepPar-1.These findings are consistent with carcinoma. The morphologic findings and pattern of immunohistochemical staining are non-specific and possible sites of origin include but are not limited to pancreaticobiliary tract, GI tract, prostate, kidney, lung, and breast. Per [facility], the patient had a PET scan performed in July 2023 which demonstrated a single hepatic  hypermetabolic lesion without other sites of disease.    01/19/2022 Surgery   Liver lesion resection at [place] by Dr.Zani  A.  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  Tumor extends to hepatic parenchymal margin, but all other margins are negative for tumor. See synoptic report and comment.   Background liver with: Mild steatosis (20%).  No definitive evidence of steatohepatitis.    Mild periportal fibrosis (trichrome and reticulin). No stainable iron (Prussian blue stain).  No evidence to support alpha-1-antitrypsin deficiency on PAS-D stain.   pT1a pNx   01/25/2022 Cancer Staging   Staging form: Intrahepatic Bile Duct, AJCC 8th Edition - Pathologic stage from 01/25/2022: Stage Unknown (pT1a, pNX, cM0) - Signed by [name], MD on 02/21/2022 Stage prefix: Initial diagnosis    04/18/2021, patient establish care with neurology Dr. Mickeal Skinner for intermittent altered cognition.  he was recommended to start Vimpat.    INTERVAL HISTORY Thaddeus Evitts is a 62 y.o. male who has above history reviewed by me today presents for follow up visit for management of inguinal nodal recurrence of melanoma, resected T1a cholangiocarcinoma.  He is on concurrent chemotherapy Xeloda with radiation. He tolerates well. Denies any nausea, vomiting, diarrhea.    :Review of Systems  Constitutional:  Negative for appetite change, chills, fatigue, fever and unexpected weight change.  HENT:   Negative for hearing loss and voice change.   Eyes:  Negative for eye problems and icterus.  Respiratory:  Negative for chest tightness, cough and shortness of breath.   Cardiovascular:  Positive for leg swelling. Negative for chest pain.  Gastrointestinal:  Negative for abdominal distention and abdominal pain.  Endocrine: Negative for hot flashes.  Genitourinary:  Negative for difficulty urinating, dysuria and frequency.   Musculoskeletal:        Status post left hip replacement  Skin:  Negative for itching and rash.       Skin hypo-pigmentation on upper extremities, no change  Neurological:  Negative for light-headedness and numbness.       Spells  Hematological:  Negative for adenopathy. Does not bruise/bleed easily.  Psychiatric/Behavioral:  Negative for confusion.     MEDICAL HISTORY:  Past Medical History:  Diagnosis Date   Anemia    iron treatments   Anxiety    Aortic atherosclerosis (HCC)     Arthritis    Cancer of groin ([place]) 2021   left groin, resected, radiation   Cataract    Complication of anesthesia    PONV   Coronary artery disease    Dizziness of unknown etiology    has led to seizures and passing out.   Family history of adverse reaction to anesthesia    PONV mother   GERD (gastroesophageal reflux disease)    History of complete heart block    PPM placed   Hyperlipidemia    Hypertension    LBBB (left bundle branch block)    Lymphedema of left leg    uses thigh high compression stockings   Melanoma ([place]) 2012   skin cancer, left thigh   OSA on CPAP    PONV (postoperative nausea and vomiting) 04/16/2019   Port-A-Cath in place    RIGHT chest wall   Presence of cardiac pacemaker    [employer]   Seizures ([place])    still has episodes of dizziness. last event 1 month ago (march 2022) and will pass out. takes clonazepam    SURGICAL HISTORY: Past Surgical History:  Procedure Laterality Date   CORONARY ARTERY BYPASS GRAFT N/A 08/10/2021   Procedure: CORONARY ARTERY BYPASS GRAFTING (CABG) X 3 USING  LEFT INTERNAL MAMMARY ARTERY AND RIGHT GREATER SAPHENOUS VEIN;  Surgeon: Dahlia Byes, MD;  Location: [place];  Service: Open Heart Surgery;  Laterality: N/A;   CT RADIATION THERAPY GUIDE     left groin   dental implant     permanent implant   ENDOVEIN HARVEST OF GREATER SAPHENOUS VEIN Right 08/10/2021   Procedure: ENDOVEIN HARVEST OF GREATER SAPHENOUS VEIN;  Surgeon: Dahlia Byes, MD;  Location: [place];  Service: Open Heart Surgery;  Laterality: Right;   KNEE SURGERY Left    arthroscopy   LEFT HEART CATH AND CORONARY ANGIOGRAPHY Left 06/29/2017   Procedure: LEFT HEART CATH AND CORONARY ANGIOGRAPHY;  Surgeon: [name], MD;  Location: [place] [facility];  Service: Cardiovascular;  Laterality: Left;   LEFT HEART CATH AND CORONARY ANGIOGRAPHY N/A 08/08/2021   Procedure: LEFT HEART CATH AND CORONARY ANGIOGRAPHY;  Surgeon: [name], MD;  Location: [place] [facility];  Service: Cardiovascular;  Laterality: N/A;   LYMPH NODE DISSECTION Left 04/16/2019   Procedure: Left inguinal Lymph Node Dissection;  Surgeon: [name], MD;  Location: [place];  Service: General;  Laterality: Left;   MELANOMA EXCISION Left 04/16/2019   Procedure: MELANOMA EXCISION LEFT GROIN MASS;  Surgeon: [name], MD;  Location: [place];  Service: General;  Laterality: Left;   MELANOMA EXCISION WITH SENTINEL LYMPH NODE BIOPSY Left 2012   Left calf    PACEMAKER INSERTION N/A 08/26/2018   Procedure: INSERTION PACEMAKER;  Surgeon: [name], MD;  Location: [facility];  Service: Cardiovascular;  Laterality: N/A;   PORTA CATH INSERTION N/A 08/26/2019   Procedure: PORTA CATH INSERTION;  Surgeon: Katha Cabal, MD;  Location: [place] [facility];  Service: Cardiovascular;  Laterality: N/A;   SUPERFICIAL LYMPH NODE BIOPSY / EXCISION Left 2020   lymph nodes removed around left groin melanoma site   TEE WITHOUT CARDIOVERSION N/A 08/10/2021   Procedure: TRANSESOPHAGEAL ECHOCARDIOGRAM (TEE);  Surgeon: Dahlia Byes, MD;  Location: [place];  Service: Open Heart Surgery;  Laterality: N/A;   TEMPORARY PACEMAKER N/A 08/25/2018   Procedure: TEMPORARY PACEMAKER;  Surgeon: [name], MD;  Location: [place] [facility];  Service: Cardiovascular;  Laterality: N/A;   TOTAL HIP ARTHROPLASTY Left 07/14/2020   Procedure: TOTAL HIP ARTHROPLASTY;  Surgeon: Dereck Leep, MD;  Location: [facility];  Service: Orthopedics;  Laterality: Left;    SOCIAL HISTORY: Social History   Socioeconomic History   Marital status: Married    Spouse name: Vicente Males    Number of children: 7   Years of education: 12   Highest education level: Not on file  Occupational History    Comment: disability  Tobacco Use   Smoking status: Never   Smokeless tobacco: Never  Vaping Use   Vaping Use: Never used  Substance and Sexual Activity   Alcohol use: No   Drug use: No   Sexual activity: Not  Currently  Other Topics Concern   Not on file  Social History Narrative   Lives with  Wife,   Has 2 small dogs   Caffeine use: sodas (2 per day)      Out of work on disability.  Has a walk in shower. No stairs to climb   Oncology treatment ongoing. Uses port a cath for treatment.      pacemaker   Social Determinants of Health   Financial Resource Strain: Low Risk  (02/12/2019)   Overall Financial Resource Strain (CARDIA)    Difficulty of Paying  Living Expenses: Not hard at all  Food Insecurity: [place] Present (02/21/2022)   Hunger Vital Sign    Worried About Running Out of Food in the Last Year: Sometimes true    Ran Out of Food in the Last Year: Often true  Transportation Needs: No Transportation Needs (02/21/2022)   PRAPARE - Hydrologist (Medical): No    Lack of Transportation (Non-Medical): No  Physical Activity: Unknown (02/12/2019)   Exercise Vital Sign    Days of Exercise per Week: 0 days    Minutes of Exercise per Session: Not on file  Stress: No Stress Concern Present (02/12/2019)   [place]    Feeling of Stress : Only a little  Social Connections: Unknown (02/12/2019)   Social Connection and Isolation Panel [NHANES]    Frequency of Communication with Friends and Family: More than three times a week    Frequency of Social Gatherings with Friends and Family: Not on file    Attends Religious Services: Not on file    Active Member of Clubs or Organizations: Not on file    Attends Archivist Meetings: Not on file    Marital Status: Married  Intimate Partner Violence: Not At Risk (02/21/2022)   Humiliation, Afraid, Rape, and Kick questionnaire    Fear of Current or Ex-Partner: No    Emotionally Abused: No    Physically Abused: No    Sexually Abused: No    FAMILY HISTORY: Family History  Problem Relation Age of Onset   Cancer Paternal Grandmother      ALLERGIES:  is allergic to ibuprofen, levetiracetam, and nsaids.  MEDICATIONS:  Current Outpatient Medications  Medication Sig Dispense Refill   apixaban (ELIQUIS) 5 MG TABS tablet Take 5 mg by mouth 2 (two) times daily.     aspirin 81 MG EC tablet Take 81 mg by mouth daily.     atorvastatin (LIPITOR) 20 MG tablet Take 20 mg by mouth daily.     calcium-vitamin D (OSCAL WITH D) 500-5 MG-MCG tablet Take 1 tablet by mouth daily. 30 tablet 0   capecitabine (XELODA) 150 MG tablet Take 2 tablets (300 mg total) by mouth 2 (two) times daily after a meal. Take Monday-Friday. Take only on days of radiation. Take along with '500mg'$  tablets. 100 tablet 0   capecitabine (XELODA) 500 MG tablet Take 3 tablets (1,500 mg total) by mouth 2 (two) times daily after a meal. Take Monday-Friday. Take only on days of radiation. Take along with '150mg'$  tablets. 150 tablet 0   Carboxymeth-Glyc-Polysorb PF (REFRESH OPTIVE MEGA-3) 0.5-1-0.5 % SOLN Place 1 drop into both eyes daily as needed (dry eyes).     clonazePAM (KLONOPIN) 0.5 MG tablet TAKE 1 TABLET BY MOUTH IN THE MORNING AND 2 TABS BY MOUTH IN THE EVENING 90 tablet 0   febuxostat (ULORIC) 40 MG tablet Take 40 mg by mouth daily.     ferrous sulfate 325 (65 FE) MG EC tablet TAKE 1 TABLET BY MOUTH 2 TIMES DAILY WITH A MEAL. 180 tablet 1   hydrochlorothiazide (HYDRODIURIL) 25 MG tablet Take 1 tablet (25 mg total) by mouth daily. 30 tablet 0   hydrocortisone 2.5 % ointment Apply 1 application. topically daily as needed (itching).     Lacosamide 100 MG TABS TAKE 1 TABLET (100 MG TOTAL) BY MOUTH IN THE MORNING AND AT BEDTIME. 60 tablet 3   lidocaine-prilocaine (EMLA) cream Apply 1 application. topically as  needed. Apply small amount of cream to port site approx 1-2 hours prior to appointment. 30 g 11   lisinopril (ZESTRIL) 20 MG tablet Take 1 tablet by mouth daily.     metoprolol succinate (TOPROL-XL) 25 MG 24 hr tablet Take 25 mg by mouth daily.     Multiple Vitamin  (MULTIVITAMIN WITH MINERALS) TABS tablet Take 1 tablet by mouth daily. Centrum Silver     omeprazole (PRILOSEC) 20 MG capsule TAKE 1 CAPSULE BY MOUTH EVERY DAY 90 capsule 0   ondansetron (ZOFRAN) 4 MG tablet TAKE 1 TABLET BY MOUTH EVERY 8 HOURS AS NEEDED FOR NAUSEA AND VOMITING 90 tablet 1   traZODone (DESYREL) 50 MG tablet TAKE 1 TABLET BY MOUTH AT BEDTIME AS NEEDED FOR SLEEP. 30 tablet 0   No current facility-administered medications for this visit.   Facility-Administered Medications Ordered in Other Visits  Medication Dose Route Frequency Provider Last Rate Last Admin   heparin lock flush 100 UNIT/ML injection            heparin lock flush 100 UNIT/ML injection              PHYSICAL EXAMINATION: ECOG PERFORMANCE STATUS: 1 - Symptomatic but completely ambulatory Vitals:   04/20/22 1039  BP: 128/73  Pulse: 83  Temp: (!) 97.2 F (36.2 C)  SpO2: 99%   Filed Weights   04/20/22 1039  Weight: 249 lb (112.9 kg)    Physical Exam Constitutional:      General: He is not in acute distress.    Comments: Patient ambulates independently  HENT:     Head: Normocephalic and atraumatic.  Eyes:     General: No scleral icterus.    Pupils: Pupils are equal, round, and reactive to light.  Cardiovascular:     Rate and Rhythm: Normal rate and regular rhythm.     Heart sounds: Normal heart sounds.  Pulmonary:     Effort: Pulmonary effort is normal. No respiratory distress.     Breath sounds: No wheezing.  Abdominal:     General: Bowel sounds are normal. There is no distension.     Palpations: Abdomen is soft. There is no mass.     Tenderness: There is no abdominal tenderness.     Comments:    Musculoskeletal:        General: No deformity. Normal range of motion.     Cervical back: Normal range of motion and neck supple.     Comments: Left lower extremity edema-chronic   Skin:    General: Skin is warm and dry.  Neurological:     Mental Status: He is alert and oriented to person,  place, and time. Mental status is at baseline.     Cranial Nerves: No cranial nerve deficit.     Coordination: Coordination normal.  Psychiatric:        Mood and Affect: Mood normal.     LABORATORY DATA:  I have reviewed the data as listed    Latest Ref Rng & Units 04/20/2022   10:30 AM 04/10/2022    8:49 AM 03/30/2022    9:37 AM  CBC  WBC 4.0 - 10.5 K/uL 3.2  4.1  4.4   Hemoglobin 13.0 - 17.0 g/dL 11.4  11.3  11.9   Hematocrit 39.0 - 52.0 % 35.3  34.1  35.0   Platelets 150 - 400 K/uL 143  149  180       Latest Ref Rng & Units 04/20/2022   10:30 AM 04/10/2022  8:49 AM 03/30/2022    9:37 AM  CMP  Glucose 70 - 99 mg/dL 138  110  138   BUN 8 - 23 mg/dL 36  24  32   Creatinine 0.61 - 1.24 mg/dL 1.42  1.44  1.49   Sodium 135 - 145 mmol/L 139  137  137   Potassium 3.5 - 5.1 mmol/L 4.6  4.3  4.4   Chloride 98 - 111 mmol/L 110  109  109   CO2 22 - 32 mmol/L '22  21  23   '$ Calcium 8.9 - 10.3 mg/dL 9.3  8.5  8.7   Total Protein 6.5 - 8.1 g/dL 7.3  6.6  6.9   Total Bilirubin 0.3 - 1.2 mg/dL 0.5  0.4  0.6   Alkaline Phos 38 - 126 U/L 68  68  67   AST 15 - 41 U/L '29  28  24   '$ ALT 0 - 44 U/L '30  28  27      '$ RADIOGRAPHIC STUDIES: I have personally reviewed the radiological images as listed and agreed with the findings in the report. DG Chest Portable 1 View  Result Date: 02/21/2022 CLINICAL DATA:  Shortness of breath EXAM: PORTABLE CHEST 1 VIEW COMPARISON:  09/14/2021 FINDINGS: Left-sided implanted cardiac device and right chest port remain in place. Stable heart size status post sternotomy and CABG. No focal airspace consolidation, pleural effusion, or pneumothorax. IMPRESSION: No active disease. Electronically Signed   By: Davina Poke D.O.   On: 02/21/2022 13:43

## 2022-04-20 NOTE — Assessment & Plan Note (Signed)
avoid nephrotoxins.   creatinine fluctuates. Encourage oral hydration.

## 2022-04-21 ENCOUNTER — Ambulatory Visit
Admission: RE | Admit: 2022-04-21 | Discharge: 2022-04-21 | Disposition: A | Discharge status: Home / Self Care | Payer: 59 | Source: Ambulatory Visit | Attending: Radiation Oncology | Admitting: Radiation Oncology

## 2022-04-21 ENCOUNTER — Other Ambulatory Visit: Payer: Self-pay

## 2022-04-21 DIAGNOSIS — C221 Intrahepatic bile duct carcinoma: Secondary | ICD-10-CM | POA: Diagnosis not present

## 2022-04-21 DIAGNOSIS — C438 Malignant melanoma of overlapping sites of skin: Secondary | ICD-10-CM | POA: Diagnosis not present

## 2022-04-21 DIAGNOSIS — N1831 Chronic kidney disease, stage 3a: Secondary | ICD-10-CM | POA: Diagnosis not present

## 2022-04-21 DIAGNOSIS — Z510 Encounter for antineoplastic radiation therapy: Secondary | ICD-10-CM | POA: Diagnosis not present

## 2022-04-21 LAB — RAD ONC ARIA SESSION SUMMARY
Course Elapsed Days: 29
Plan Fractions Treated to Date: 19
Plan Prescribed Dose Per Fraction: 1.8 Gy
Plan Total Fractions Prescribed: 25
Plan Total Prescribed Dose: 45 Gy
Reference Point Dosage Given to Date: 34.2 Gy
Reference Point Session Dosage Given: 1.8 Gy
Session Number: 19

## 2022-04-24 ENCOUNTER — Ambulatory Visit
Admission: RE | Admit: 2022-04-24 | Discharge: 2022-04-24 | Disposition: A | Discharge status: Home / Self Care | Payer: 59 | Source: Ambulatory Visit | Attending: Radiation Oncology | Admitting: Radiation Oncology

## 2022-04-24 ENCOUNTER — Other Ambulatory Visit: Payer: Self-pay

## 2022-04-24 DIAGNOSIS — C221 Intrahepatic bile duct carcinoma: Secondary | ICD-10-CM | POA: Diagnosis not present

## 2022-04-24 DIAGNOSIS — N1831 Chronic kidney disease, stage 3a: Secondary | ICD-10-CM | POA: Diagnosis not present

## 2022-04-24 DIAGNOSIS — C438 Malignant melanoma of overlapping sites of skin: Secondary | ICD-10-CM | POA: Diagnosis not present

## 2022-04-24 DIAGNOSIS — Z510 Encounter for antineoplastic radiation therapy: Secondary | ICD-10-CM | POA: Diagnosis not present

## 2022-04-24 LAB — RAD ONC ARIA SESSION SUMMARY
Course Elapsed Days: 32
Plan Fractions Treated to Date: 20
Plan Prescribed Dose Per Fraction: 1.8 Gy
Plan Total Fractions Prescribed: 25
Plan Total Prescribed Dose: 45 Gy
Reference Point Dosage Given to Date: 36 Gy
Reference Point Session Dosage Given: 1.8 Gy
Session Number: 20

## 2022-04-25 ENCOUNTER — Ambulatory Visit
Admission: RE | Admit: 2022-04-25 | Discharge: 2022-04-25 | Disposition: A | Discharge status: Home / Self Care | Payer: 59 | Source: Ambulatory Visit | Attending: Radiation Oncology | Admitting: Radiation Oncology

## 2022-04-25 ENCOUNTER — Other Ambulatory Visit: Payer: Self-pay

## 2022-04-25 DIAGNOSIS — C438 Malignant melanoma of overlapping sites of skin: Secondary | ICD-10-CM | POA: Diagnosis not present

## 2022-04-25 DIAGNOSIS — N1831 Chronic kidney disease, stage 3a: Secondary | ICD-10-CM | POA: Diagnosis not present

## 2022-04-25 DIAGNOSIS — Z510 Encounter for antineoplastic radiation therapy: Secondary | ICD-10-CM | POA: Diagnosis not present

## 2022-04-25 DIAGNOSIS — C221 Intrahepatic bile duct carcinoma: Secondary | ICD-10-CM | POA: Diagnosis not present

## 2022-04-25 LAB — RAD ONC ARIA SESSION SUMMARY
Course Elapsed Days: 33
Plan Fractions Treated to Date: 21
Plan Prescribed Dose Per Fraction: 1.8 Gy
Plan Total Fractions Prescribed: 25
Plan Total Prescribed Dose: 45 Gy
Reference Point Dosage Given to Date: 37.8 Gy
Reference Point Session Dosage Given: 1.8 Gy
Session Number: 21

## 2022-04-26 ENCOUNTER — Ambulatory Visit
Admission: RE | Admit: 2022-04-26 | Discharge: 2022-04-26 | Disposition: A | Discharge status: Home / Self Care | Payer: 59 | Source: Ambulatory Visit | Attending: Radiation Oncology | Admitting: Radiation Oncology

## 2022-04-26 ENCOUNTER — Other Ambulatory Visit: Payer: Self-pay | Admitting: Internal Medicine

## 2022-04-26 ENCOUNTER — Other Ambulatory Visit: Payer: Self-pay

## 2022-04-26 DIAGNOSIS — C438 Malignant melanoma of overlapping sites of skin: Secondary | ICD-10-CM | POA: Diagnosis not present

## 2022-04-26 DIAGNOSIS — Z510 Encounter for antineoplastic radiation therapy: Secondary | ICD-10-CM | POA: Diagnosis not present

## 2022-04-26 DIAGNOSIS — C221 Intrahepatic bile duct carcinoma: Secondary | ICD-10-CM | POA: Diagnosis not present

## 2022-04-26 DIAGNOSIS — N1831 Chronic kidney disease, stage 3a: Secondary | ICD-10-CM | POA: Diagnosis not present

## 2022-04-26 LAB — RAD ONC ARIA SESSION SUMMARY
Course Elapsed Days: 34
Plan Fractions Treated to Date: 22
Plan Prescribed Dose Per Fraction: 1.8 Gy
Plan Total Fractions Prescribed: 25
Plan Total Prescribed Dose: 45 Gy
Reference Point Dosage Given to Date: 39.6 Gy
Reference Point Session Dosage Given: 1.8 Gy
Session Number: 22

## 2022-04-27 ENCOUNTER — Ambulatory Visit
Admission: RE | Admit: 2022-04-27 | Discharge: 2022-04-27 | Disposition: A | Discharge status: Home / Self Care | Payer: 59 | Source: Ambulatory Visit | Attending: Radiation Oncology | Admitting: Radiation Oncology

## 2022-04-27 ENCOUNTER — Inpatient Hospital Stay: Payer: 59 | Attending: Oncology

## 2022-04-27 ENCOUNTER — Other Ambulatory Visit: Payer: Self-pay

## 2022-04-27 DIAGNOSIS — N1831 Chronic kidney disease, stage 3a: Secondary | ICD-10-CM | POA: Insufficient documentation

## 2022-04-27 DIAGNOSIS — C438 Malignant melanoma of overlapping sites of skin: Secondary | ICD-10-CM | POA: Diagnosis not present

## 2022-04-27 DIAGNOSIS — Z95828 Presence of other vascular implants and grafts: Secondary | ICD-10-CM

## 2022-04-27 DIAGNOSIS — C221 Intrahepatic bile duct carcinoma: Secondary | ICD-10-CM | POA: Insufficient documentation

## 2022-04-27 DIAGNOSIS — Z510 Encounter for antineoplastic radiation therapy: Secondary | ICD-10-CM | POA: Insufficient documentation

## 2022-04-27 LAB — RAD ONC ARIA SESSION SUMMARY
Course Elapsed Days: 35
Plan Fractions Treated to Date: 23
Plan Prescribed Dose Per Fraction: 1.8 Gy
Plan Total Fractions Prescribed: 25
Plan Total Prescribed Dose: 45 Gy
Reference Point Dosage Given to Date: 41.4 Gy
Reference Point Session Dosage Given: 1.8 Gy
Session Number: 23

## 2022-04-27 MED ORDER — CLONAZEPAM 0.5 MG PO TABS: ORAL_TABLET | ORAL | 0 refills | Status: DC

## 2022-04-27 MED ORDER — SODIUM CHLORIDE 0.9% FLUSH
10.0000 mL | Freq: Once | INTRAVENOUS | Status: AC
  Administered 2022-04-27: 10 mL via INTRAVENOUS
  Filled 2022-04-27: qty 10

## 2022-04-27 MED ORDER — HEPARIN SOD (PORK) LOCK FLUSH 100 UNIT/ML IV SOLN
500.0000 [IU] | Freq: Once | INTRAVENOUS | Status: AC
  Administered 2022-04-27: 500 [IU] via INTRAVENOUS
  Filled 2022-04-27: qty 5

## 2022-04-28 ENCOUNTER — Other Ambulatory Visit: Payer: Self-pay

## 2022-04-28 ENCOUNTER — Ambulatory Visit
Admission: RE | Admit: 2022-04-28 | Discharge: 2022-04-28 | Disposition: A | Discharge status: Home / Self Care | Payer: 59 | Source: Ambulatory Visit | Attending: Radiation Oncology | Admitting: Radiation Oncology

## 2022-04-28 ENCOUNTER — Ambulatory Visit: Payer: 59

## 2022-04-28 DIAGNOSIS — Z510 Encounter for antineoplastic radiation therapy: Secondary | ICD-10-CM | POA: Diagnosis not present

## 2022-04-28 DIAGNOSIS — C438 Malignant melanoma of overlapping sites of skin: Secondary | ICD-10-CM | POA: Diagnosis not present

## 2022-04-28 DIAGNOSIS — C221 Intrahepatic bile duct carcinoma: Secondary | ICD-10-CM | POA: Diagnosis not present

## 2022-04-28 DIAGNOSIS — N1831 Chronic kidney disease, stage 3a: Secondary | ICD-10-CM | POA: Diagnosis not present

## 2022-04-28 LAB — RAD ONC ARIA SESSION SUMMARY
Course Elapsed Days: 36
Plan Fractions Treated to Date: 24
Plan Prescribed Dose Per Fraction: 1.8 Gy
Plan Total Fractions Prescribed: 25
Plan Total Prescribed Dose: 45 Gy
Reference Point Dosage Given to Date: 43.2 Gy
Reference Point Session Dosage Given: 1.8 Gy
Session Number: 24

## 2022-05-01 ENCOUNTER — Other Ambulatory Visit: Payer: Self-pay | Admitting: Oncology

## 2022-05-01 ENCOUNTER — Ambulatory Visit
Admission: RE | Admit: 2022-05-01 | Discharge: 2022-05-01 | Disposition: A | Discharge status: Home / Self Care | Payer: 59 | Source: Ambulatory Visit | Attending: Radiation Oncology | Admitting: Radiation Oncology

## 2022-05-01 ENCOUNTER — Other Ambulatory Visit: Payer: Self-pay

## 2022-05-01 ENCOUNTER — Ambulatory Visit: Payer: 59

## 2022-05-01 DIAGNOSIS — Z510 Encounter for antineoplastic radiation therapy: Secondary | ICD-10-CM | POA: Diagnosis not present

## 2022-05-01 DIAGNOSIS — C438 Malignant melanoma of overlapping sites of skin: Secondary | ICD-10-CM | POA: Diagnosis not present

## 2022-05-01 DIAGNOSIS — C221 Intrahepatic bile duct carcinoma: Secondary | ICD-10-CM | POA: Diagnosis not present

## 2022-05-01 DIAGNOSIS — N1831 Chronic kidney disease, stage 3a: Secondary | ICD-10-CM | POA: Diagnosis not present

## 2022-05-01 LAB — RAD ONC ARIA SESSION SUMMARY
Course Elapsed Days: 39
Plan Fractions Treated to Date: 25
Plan Prescribed Dose Per Fraction: 1.8 Gy
Plan Total Fractions Prescribed: 25
Plan Total Prescribed Dose: 45 Gy
Reference Point Dosage Given to Date: 45 Gy
Reference Point Session Dosage Given: 1.8 Gy
Session Number: 25

## 2022-05-02 ENCOUNTER — Ambulatory Visit: Payer: 59

## 2022-05-03 ENCOUNTER — Inpatient Hospital Stay (HOSPITAL_BASED_OUTPATIENT_CLINIC_OR_DEPARTMENT_OTHER): Payer: 59 | Admitting: Oncology

## 2022-05-03 ENCOUNTER — Inpatient Hospital Stay: Payer: 59

## 2022-05-03 ENCOUNTER — Telehealth: Payer: Self-pay

## 2022-05-03 ENCOUNTER — Other Ambulatory Visit: Payer: Self-pay

## 2022-05-03 ENCOUNTER — Other Ambulatory Visit (HOSPITAL_COMMUNITY): Payer: Self-pay

## 2022-05-03 ENCOUNTER — Encounter: Payer: Self-pay | Admitting: Oncology

## 2022-05-03 ENCOUNTER — Telehealth: Payer: Self-pay | Admitting: Pharmacist

## 2022-05-03 VITALS — BP 135/70 | HR 87 | Temp 96.4°F | Wt 250.2 lb

## 2022-05-03 DIAGNOSIS — D631 Anemia in chronic kidney disease: Secondary | ICD-10-CM

## 2022-05-03 DIAGNOSIS — C438 Malignant melanoma of overlapping sites of skin: Secondary | ICD-10-CM | POA: Insufficient documentation

## 2022-05-03 DIAGNOSIS — C221 Intrahepatic bile duct carcinoma: Secondary | ICD-10-CM | POA: Diagnosis not present

## 2022-05-03 DIAGNOSIS — Z452 Encounter for adjustment and management of vascular access device: Secondary | ICD-10-CM | POA: Insufficient documentation

## 2022-05-03 DIAGNOSIS — N1831 Chronic kidney disease, stage 3a: Secondary | ICD-10-CM | POA: Insufficient documentation

## 2022-05-03 DIAGNOSIS — Z5111 Encounter for antineoplastic chemotherapy: Secondary | ICD-10-CM

## 2022-05-03 DIAGNOSIS — N1832 Chronic kidney disease, stage 3b: Secondary | ICD-10-CM

## 2022-05-03 LAB — COMPREHENSIVE METABOLIC PANEL
ALT: 32 U/L (ref 0–44)
AST: 36 U/L (ref 15–41)
Albumin: 4 g/dL (ref 3.5–5.0)
Alkaline Phosphatase: 74 U/L (ref 38–126)
Anion gap: 8 (ref 5–15)
BUN: 36 mg/dL — ABNORMAL HIGH (ref 8–23)
CO2: 21 mmol/L — ABNORMAL LOW (ref 22–32)
Calcium: 8.9 mg/dL (ref 8.9–10.3)
Chloride: 107 mmol/L (ref 98–111)
Creatinine, Ser: 1.41 mg/dL — ABNORMAL HIGH (ref 0.61–1.24)
GFR, Estimated: 56 mL/min — ABNORMAL LOW (ref >60)
Glucose, Bld: 156 mg/dL — ABNORMAL HIGH (ref 70–99)
Potassium: 4.5 mmol/L (ref 3.5–5.1)
Sodium: 136 mmol/L (ref 135–145)
Total Bilirubin: 0.5 mg/dL (ref 0.3–1.2)
Total Protein: 6.9 g/dL (ref 6.5–8.1)

## 2022-05-03 LAB — CBC WITH DIFFERENTIAL/PLATELET
Abs Immature Granulocytes: 0.05 10*3/uL (ref 0.00–0.07)
Basophils Absolute: 0 10*3/uL (ref 0.0–0.1)
Basophils Relative: 1 %
Eosinophils Absolute: 0.1 10*3/uL (ref 0.0–0.5)
Eosinophils Relative: 3 %
HCT: 33.9 % — ABNORMAL LOW (ref 39.0–52.0)
Hemoglobin: 11.3 g/dL — ABNORMAL LOW (ref 13.0–17.0)
Immature Granulocytes: 2 %
Lymphocytes Relative: 9 %
Lymphs Abs: 0.3 10*3/uL — ABNORMAL LOW (ref 0.7–4.0)
MCH: 31.5 pg (ref 26.0–34.0)
MCHC: 33.3 g/dL (ref 30.0–36.0)
MCV: 94.4 fL (ref 80.0–100.0)
Monocytes Absolute: 0.4 10*3/uL (ref 0.1–1.0)
Monocytes Relative: 13 %
Neutro Abs: 2.5 10*3/uL (ref 1.7–7.7)
Neutrophils Relative %: 72 %
Platelets: 142 10*3/uL — ABNORMAL LOW (ref 150–400)
RBC: 3.59 MIL/uL — ABNORMAL LOW (ref 4.22–5.81)
RDW: 15.2 % (ref 11.5–15.5)
WBC: 3.4 10*3/uL — ABNORMAL LOW (ref 4.0–10.5)
nRBC: 0 % (ref 0.0–0.2)

## 2022-05-03 MED ORDER — CAPECITABINE 150 MG PO TABS
300.0000 mg | ORAL_TABLET | Freq: Two times a day (BID) | ORAL | 0 refills | Status: DC
  Filled 2022-05-03: qty 56, 14d supply, fill #0

## 2022-05-03 MED ORDER — CAPECITABINE 150 MG PO TABS
300.0000 mg | ORAL_TABLET | Freq: Two times a day (BID) | ORAL | 0 refills | Status: DC
  Filled 2022-05-03: qty 56, 21d supply, fill #0
  Filled 2022-05-03: qty 56, 14d supply, fill #0

## 2022-05-03 MED ORDER — HEPARIN SOD (PORK) LOCK FLUSH 100 UNIT/ML IV SOLN
500.0000 [IU] | Freq: Once | INTRAVENOUS | Status: DC
  Filled 2022-05-03: qty 5

## 2022-05-03 MED ORDER — CAPECITABINE 500 MG PO TABS
2000.0000 mg | ORAL_TABLET | Freq: Two times a day (BID) | ORAL | 0 refills | Status: DC
  Filled 2022-05-03: qty 112, 14d supply, fill #0

## 2022-05-03 MED ORDER — CAPECITABINE 500 MG PO TABS
2000.0000 mg | ORAL_TABLET | Freq: Two times a day (BID) | ORAL | 0 refills | Status: DC
  Filled 2022-05-03: qty 112, 14d supply, fill #0
  Filled 2022-05-03: qty 112, 21d supply, fill #0

## 2022-05-03 MED ORDER — SODIUM CHLORIDE 0.9% FLUSH
10.0000 mL | Freq: Once | INTRAVENOUS | Status: DC
  Filled 2022-05-03: qty 10

## 2022-05-03 MED ORDER — HEPARIN SOD (PORK) LOCK FLUSH 100 UNIT/ML IV SOLN
500.0000 [IU] | Freq: Once | INTRAVENOUS | Status: AC
  Administered 2022-05-03: 500 [IU] via INTRAVENOUS
  Filled 2022-05-03: qty 5

## 2022-05-03 NOTE — Assessment & Plan Note (Signed)
Encourage oral hydration and avoid nephrotoxins.   

## 2022-05-03 NOTE — Telephone Encounter (Signed)
Oral Oncology Pharmacist Encounter  Received new prescription for Xeloda (capecitabine) for the adjuvant treatment of cholangiocarcinoma, planned duration of 4 months. Patient has completed Xeloda and radiation therapy and will now finish off his treatment with Xeloda monotherapy.  CMP from 05/03/22 assessed, no relevant lab abnormalities. Prescription dose and frequency assessed.   Evaluated chart and no patient barriers to medication adherence identified.   Prescription has been e-scribed to the [facility] for benefits analysis and approval.  Oral Oncology Clinic will continue to follow for insurance authorization, copayment issues, initial counseling and start date.   Darl Pikes, PharmD, BCPS, BCOP, CPP Hematology/Oncology Clinical Pharmacist Practitioner [place]/DB/AP Oral [facility] [phone]  05/03/2022 11:59 AM

## 2022-05-03 NOTE — Assessment & Plan Note (Signed)
Chemotherapy plan as listed above 

## 2022-05-03 NOTE — Telephone Encounter (Signed)
Patient will be starting on Xeloda on Monday 2/12. Please schedule pt for lab/MD/ IVF the week of 2/19 and inform pt of appt.

## 2022-05-03 NOTE — Assessment & Plan Note (Signed)
Patient has no radiographic evidence of metastatic melanoma. Off immunotherapy. Continue surveillance 

## 2022-05-03 NOTE — Progress Notes (Signed)
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  S/p chemotherapy Xeloda '825mg'$ /m2 BID+ RT Labs are reviewed and discussed with patient. plan additional 4 months of Xeloda [1000 mg/m2 BID for 14 of every 21 days]  Patient has upcoming image scan arranged in February at [facility]. I plan to see patient 1 week after starting on adjuvant Xeloda  Anemia in chronic kidney disease Continue monitor.   Encounter for antineoplastic chemotherapy Chemotherapy plan as listed above.   Malignant melanoma of overlapping sites [facility]) Patient has no radiographic evidence of metastatic melanoma. Off immunotherapy. Continue surveillance  Stage 3a chronic kidney disease ([place]) Encourage oral hydration and avoid nephrotoxins.      No orders of the defined types were placed in this encounter.   Follow-up 1 week after starting Xeloda '1000mg'$ /m2 BID All questions were answered. The patient knows to call the clinic with any problems, questions or concerns.  [name], MD, PhD [facility] Hematology Oncology 05/03/2022   CHIEF  COMPLAINTS/REASON FOR VISIT:  Follow up for melanoma HISTORY OF PRESENTING ILLNESS:   Edward Pitts is a  62 y.o.  male presents for recurrent malignant melanoma.   Oncology History  Malignant melanoma of overlapping sites [facility])  04/16/2019 Cancer Staging   Staging form: Melanoma of the Skin, AJCC 8th Edition - Pathologic: Stage Unknown (rpTX, pN1b, cM0) - Signed by [name], MD on 07/27/2020 Stage prefix: Recurrence    04/23/2019 Initial Diagnosis   Malignant melanoma   -He has a history of left lower extremity melanoma in 2011, status post local excision -04/16/2019 patient underwent left groin mass resection  Resection pathology showed malignant melanoma, replacing a lymph node, with extracapsular extension, peripheral and deep margins involved.  Left inguinal contents, all 7 lymph nodes were negative for melanoma in the lymph nodes. Extranodal melanoma identified in lymphatic and interstitium between nodes -PDL1 80% TPS    07/07/2019 -  Radiation Therapy   status post adjuvant radiation.   07/23/2019 - 07/21/2021 Chemotherapy   Nivolumab q14d      06/29/2020 Imaging   CT chest abdomen pelvis showed stable postoperative appearance of the left groin.  No evidence of local recurrence.  No evidence of metastatic disease in the chest abdomen or pelvis.  Hepatic steatosis.  Stable subcentimeter fluid attenuation lesion of the lateral right lobe of the liver, likely benign cyst or hemangioma.  Coronary artery disease.  Aortic atherosclerosis   10/20/2020 Imaging   CT chest abdomen pelvis showed stable postoperative/radiation appearance of the left groin.  No evidence of local recurrence/metastatic disease within the chest abdomen/pelvis.  Fatty liver disease.  Diverticulosis without evidence of typhlitis.  Aortic atherosclerosis   03/10/2021 Imaging   MRI brain without contrast  showed no definitive evidence of intracranial metastatic disease.  Study is limited by absence of intravenous  contrast.     04/26/2021 Imaging   CT chest abdomen pelvis without contrast showed stable post operative changes of left groin with no evidence of recurrent disease.  No evidence of metastatic disease in the chest abdomen pelvis.  Aortic atherosclerosis   08/08/2021 - 08/16/2021 Hospital Admission    patient was hospitalized due to NSTEMI status post CABG x3.  He also had pacemaker The echocardiogram showed left ventricular ejection fraction of 65 to 70%,   09/15/2021 Imaging   CT chest abdomen pelvis w contrast  IMPRESSION: 1. Subtle hypodense 9 mm lesion in the left lobe of the liver is new from prior imaging including previous contrasted CT dating back to December 10, 2019, with the lesion appearing to equilibrate with background liver on delayed imaging sequence but is incompletely evaluated on this imaging study and technically nonspecific possibly reflecting a benign perfusional variant and while its appearance is not typical for that of a melanoma metastasis, it is not excluded on this examination. Suggest more definitive characterization by hepatic protocol MRI with and without contrast. 2. Stable postoperative changes in the left groin without evidence of local recurrent disease.3. No evidence of metastatic disease in the chest or pelvis.4.  Aortic Atherosclerosis (ICD10-I70.0).      09/23/2021 Imaging   Contrast-enhanced liver ultrasound Mildly hypoenhancing 2.2 cm mass in the posterior aspect of the left lobe of the liver with washout characteristics concerning for non hepatocellular malignancy, concerning for melanotic metastasis given history. The lesion is in an unfavorable location for percutaneous biopsy. Consider PET-CT for further characterization   10/21/2021 -  Chemotherapy   Nivolumab q14d      11/10/2021 Imaging   MRI Brain w wo contrast Negative for metastatic disease to the brain   11/10/2021 Imaging   MRI abdomen w wo contrast 1. Lesion of the posterior superior left  lobe of the liver, hepatic segment II, measuring 1.9 x 1.8 cm corresponding to findings of prior imaging. Evaluation is somewhat limited breath motion artifact however there is subtle associated rim enhancement of this lesion. Findings are most in keeping with a hepatic metastasis in the setting of known recurrent melanoma. 2. Mild hepatic steatosis. 3. Cardiomegaly.    02/27/2022 -  Chemotherapy   Patient is on Treatment Plan : Capecitabine (825 mg/m2 bid) + XRT     Cholangiocarcinoma ([place])  12/19/2021 Initial Diagnosis   Liver biopsy showed carcinoma  -The slides on the patient's prior left inguinal lymph node biopsy ([id number]) with metastatic melanoma were reviewed in conjunction with  this case. The morphology of the malignant cells between the two cases are dissimilar. A limited panel of immunohistochemical stains was performed and the neoplastic cells are positive for superpancytokeratin, cytokeratin 7 (diffuse, strong), cytokeratin 20 (patchy, moderate) and negative for S100, SOX-10, and HepPar-1.These findings are consistent with carcinoma. The morphologic findings and pattern of immunohistochemical staining are non-specific and possible sites of origin include but are not limited to pancreaticobiliary tract, GI tract, prostate, kidney, lung, and breast. Per [facility], the patient had a PET scan performed in July 2023 which demonstrated a single hepatic  hypermetabolic lesion without other sites of disease.    01/19/2022 Surgery   Liver lesion resection at [facility] by Dr.Zani  A.  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  Tumor extends to hepatic parenchymal margin, but all other margins  are negative for tumor. See synoptic report and comment.   Background liver with: Mild steatosis (20%).  No definitive evidence of steatohepatitis.   Mild periportal fibrosis (trichrome and reticulin). No stainable iron (Prussian blue  stain).  No evidence to support alpha-1-antitrypsin deficiency on PAS-D stain.   pT1a pNx   01/25/2022 Cancer Staging   Staging form: Intrahepatic Bile Duct, AJCC 8th Edition - Pathologic stage from 01/25/2022: Stage Unknown (pT1a, pNX, cM0) - Signed by [name], MD on 02/21/2022 Stage prefix: Initial diagnosis   03/23/2022 - 05/01/2022 Radiation Therapy   Concurrent Xeloda '825mg'$ /m2 BID + Radiation.     04/18/2021, patient establish care with neurology Dr. Mickeal Skinner for intermittent altered cognition.  he was recommended to start Vimpat.    INTERVAL HISTORY Nashawn Hillock is a 62 y.o. male who has above history reviewed by me today presents for follow up visit for management of inguinal nodal recurrence of melanoma, resected T1a cholangiocarcinoma.  He tolerates concurrent chemotherapy Xeloda with radiation.  Denies any nausea, vomiting, diarrhea. No new complaints.    :Review of Systems  Constitutional:  Negative for appetite change, chills, fatigue, fever and unexpected weight change.  HENT:   Negative for hearing loss and voice change.   Eyes:  Negative for eye problems and icterus.  Respiratory:  Negative for chest tightness, cough and shortness of breath.   Cardiovascular:  Positive for leg swelling. Negative for chest pain.  Gastrointestinal:  Negative for abdominal distention and abdominal pain.  Endocrine: Negative for hot flashes.  Genitourinary:  Negative for difficulty urinating, dysuria and frequency.   Musculoskeletal:        Status post left hip replacement  Skin:  Negative for itching and rash.       Skin hypo-pigmentation on upper extremities, no change  Neurological:  Negative for light-headedness and numbness.       Spells  Hematological:  Negative for adenopathy. Does not bruise/bleed easily.  Psychiatric/Behavioral:  Negative for confusion.     MEDICAL HISTORY:  Past Medical History:  Diagnosis Date   Anemia    iron treatments   Anxiety    Aortic  atherosclerosis (HCC)    Arthritis    Cancer of groin ([place]) 2021   left groin, resected, radiation   Cataract    Complication of anesthesia    PONV   Coronary artery disease    Dizziness of unknown etiology    has led to seizures and passing out.   Family history of adverse reaction to anesthesia    PONV mother   GERD (gastroesophageal reflux disease)    History of complete heart block    PPM placed   Hyperlipidemia    Hypertension    LBBB (left bundle branch block)    Lymphedema of left leg    uses thigh high compression stockings   Melanoma ([place]) 2012   skin cancer, left thigh   OSA on CPAP    PONV (postoperative nausea and vomiting) 04/16/2019   Port-A-Cath in place    RIGHT chest wall   Presence of cardiac pacemaker    [employer]   Seizures ([place])    still has episodes of dizziness. last event 1 month ago (march 2022) and will pass out. takes clonazepam    SURGICAL HISTORY: Past Surgical History:  Procedure Laterality Date   CORONARY ARTERY BYPASS GRAFT N/A 08/10/2021   Procedure: CORONARY ARTERY BYPASS GRAFTING (CABG) X 3 USING LEFT INTERNAL MAMMARY ARTERY AND RIGHT GREATER SAPHENOUS VEIN;  Surgeon: Dahlia Byes,  MD;  Location: [facility];  Service: Open Heart Surgery;  Laterality: N/A;   CT RADIATION THERAPY GUIDE     left groin   dental implant     permanent implant   ENDOVEIN HARVEST OF GREATER SAPHENOUS VEIN Right 08/10/2021   Procedure: ENDOVEIN HARVEST OF GREATER SAPHENOUS VEIN;  Surgeon: Dahlia Byes, MD;  Location: [place];  Service: Open Heart Surgery;  Laterality: Right;   KNEE SURGERY Left    arthroscopy   LEFT HEART CATH AND CORONARY ANGIOGRAPHY Left 06/29/2017   Procedure: LEFT HEART CATH AND CORONARY ANGIOGRAPHY;  Surgeon: [name], MD;  Location: [place] [facility];  Service: Cardiovascular;  Laterality: Left;   LEFT HEART CATH AND CORONARY ANGIOGRAPHY N/A 08/08/2021   Procedure: LEFT HEART CATH AND CORONARY ANGIOGRAPHY;  Surgeon: [name], MD;  Location: [place] [facility];  Service: Cardiovascular;  Laterality: N/A;   LYMPH NODE DISSECTION Left 04/16/2019   Procedure: Left inguinal Lymph Node Dissection;  Surgeon: [name], MD;  Location: [place];  Service: General;  Laterality: Left;   MELANOMA EXCISION Left 04/16/2019   Procedure: MELANOMA EXCISION LEFT GROIN MASS;  Surgeon: [name], MD;  Location: [place];  Service: General;  Laterality: Left;   MELANOMA EXCISION WITH SENTINEL LYMPH NODE BIOPSY Left 2012   Left calf    PACEMAKER INSERTION N/A 08/26/2018   Procedure: INSERTION PACEMAKER;  Surgeon: [name], MD;  Location: [facility];  Service: Cardiovascular;  Laterality: N/A;   PORTA CATH INSERTION N/A 08/26/2019   Procedure: PORTA CATH INSERTION;  Surgeon: Katha Cabal, MD;  Location: [place] CV [facility];  Service: Cardiovascular;  Laterality: N/A;   SUPERFICIAL LYMPH NODE BIOPSY / EXCISION Left 2020   lymph nodes removed around left groin melanoma site   TEE WITHOUT CARDIOVERSION N/A 08/10/2021   Procedure: TRANSESOPHAGEAL ECHOCARDIOGRAM (TEE);  Surgeon: Dahlia Byes, MD;  Location: [place];  Service: Open Heart Surgery;  Laterality: N/A;   TEMPORARY PACEMAKER N/A 08/25/2018   Procedure: TEMPORARY PACEMAKER;  Surgeon: [name], MD;  Location: [place] [facility];  Service: Cardiovascular;  Laterality: N/A;   TOTAL HIP ARTHROPLASTY Left 07/14/2020   Procedure: TOTAL HIP ARTHROPLASTY;  Surgeon: Dereck Leep, MD;  Location: [facility] OR[facility];  Service: Orthopedics;  Laterality: Left;    SOCIAL HISTORY: Social History   Socioeconomic History   Marital status: Married    Spouse name: Vicente Males    Number of children: 7   Years of education: 12   Highest education level: Not on file  Occupational History    Comment: disability  Tobacco Use   Smoking status: Never   Smokeless tobacco: Never  Vaping Use   Vaping Use: Never used  Substance and Sexual Activity   Alcohol use: No   Drug use: No    Sexual activity: Not Currently  Other Topics Concern   Not on file  Social History Narrative   Lives with  Wife,   Has 2 small dogs   Caffeine use: sodas (2 per day)      Out of work on disability.  Has a walk in shower. No stairs to climb   Oncology treatment ongoing. Uses port a cath for treatment.      pacemaker   Social Determinants of Health   Financial Resource Strain: Low Risk  (02/12/2019)   Overall Financial Resource Strain (CARDIA)    Difficulty of Paying Living Expenses: Not hard at all  Food Insecurity: Food Insecurity Present (02/21/2022)  Hunger Vital Sign    Worried About Running Out of Food in the Last Year: Sometimes true    Ran Out of Food in the Last Year: Often true  Transportation Needs: No Transportation Needs (02/21/2022)   PRAPARE - Hydrologist (Medical): No    Lack of Transportation (Non-Medical): No  Physical Activity: Unknown (02/12/2019)   Exercise Vital Sign    Days of Exercise per Week: 0 days    Minutes of Exercise per Session: Not on file  Stress: No Stress Concern Present (02/12/2019)   [place]    Feeling of Stress : Only a little  Social Connections: Unknown (02/12/2019)   Social Connection and Isolation Panel [NHANES]    Frequency of Communication with Friends and Family: More than three times a week    Frequency of Social Gatherings with Friends and Family: Not on file    Attends Religious Services: Not on file    Active Member of Clubs or Organizations: Not on file    Attends Archivist Meetings: Not on file    Marital Status: Married  Intimate Partner Violence: Not At Risk (02/21/2022)   Humiliation, Afraid, Rape, and Kick questionnaire    Fear of Current or Ex-Partner: No    Emotionally Abused: No    Physically Abused: No    Sexually Abused: No    FAMILY HISTORY: Family History  Problem Relation Age of Onset   Cancer  Paternal Grandmother     ALLERGIES:  is allergic to ibuprofen, levetiracetam, and nsaids.  MEDICATIONS:  Current Outpatient Medications  Medication Sig Dispense Refill   apixaban (ELIQUIS) 5 MG TABS tablet Take 5 mg by mouth 2 (two) times daily.     aspirin 81 MG EC tablet Take 81 mg by mouth daily.     atorvastatin (LIPITOR) 20 MG tablet Take 20 mg by mouth daily.     calcium-vitamin D (OSCAL WITH D) 500-5 MG-MCG tablet Take 1 tablet by mouth daily. 30 tablet 0   Carboxymeth-Glyc-Polysorb PF (REFRESH OPTIVE MEGA-3) 0.5-1-0.5 % SOLN Place 1 drop into both eyes daily as needed (dry eyes).     clonazePAM (KLONOPIN) 0.5 MG tablet TAKE 1 TABLET BY MOUTH IN THE MORNING AND 2 TABS BY MOUTH IN THE EVENING 90 tablet 0   febuxostat (ULORIC) 40 MG tablet Take 40 mg by mouth daily.     ferrous sulfate 325 (65 FE) MG EC tablet TAKE 1 TABLET BY MOUTH 2 TIMES DAILY WITH A MEAL. 180 tablet 1   hydrochlorothiazide (HYDRODIURIL) 25 MG tablet Take 1 tablet (25 mg total) by mouth daily. 30 tablet 0   hydrocortisone 2.5 % ointment Apply 1 application. topically daily as needed (itching).     Lacosamide 100 MG TABS TAKE 1 TABLET (100 MG TOTAL) BY MOUTH IN THE MORNING AND AT BEDTIME. 60 tablet 3   lidocaine-prilocaine (EMLA) cream Apply 1 application. topically as needed. Apply small amount of cream to port site approx 1-2 hours prior to appointment. 30 g 11   lisinopril (ZESTRIL) 20 MG tablet Take 1 tablet by mouth daily.     metoprolol succinate (TOPROL-XL) 25 MG 24 hr tablet Take 25 mg by mouth daily.     Multiple Vitamin (MULTIVITAMIN WITH MINERALS) TABS tablet Take 1 tablet by mouth daily. Centrum Silver     omeprazole (PRILOSEC) 20 MG capsule TAKE 1 CAPSULE BY MOUTH EVERY DAY 90 capsule 0   ondansetron (ZOFRAN)  4 MG tablet TAKE 1 TABLET BY MOUTH EVERY 8 HOURS AS NEEDED FOR NAUSEA AND VOMITING 90 tablet 1   traZODone (DESYREL) 50 MG tablet TAKE 1 TABLET BY MOUTH AT BEDTIME AS NEEDED FOR SLEEP. 30 tablet 0    capecitabine (XELODA) 150 MG tablet Take 2 tablets (300 mg total) by mouth 2 (two) times daily after a meal. Take Day 1-14 of each chemo cycle 56 tablet 0   capecitabine (XELODA) 500 MG tablet Take 4 tablets (2,000 mg total) by mouth 2 (two) times daily after a meal. Take Day1-14 of each chemotherapy cycle 112 tablet 0   No current facility-administered medications for this visit.   Facility-Administered Medications Ordered in Other Visits  Medication Dose Route Frequency Provider Last Rate Last Admin   heparin lock flush 100 UNIT/ML injection            heparin lock flush 100 UNIT/ML injection            heparin lock flush 100 unit/mL  500 Units Intravenous Once [name], MD       sodium chloride flush (NS) 0.9 % injection 10 mL  10 mL Intravenous Once [name], MD         PHYSICAL EXAMINATION: ECOG PERFORMANCE STATUS: 1 - Symptomatic but completely ambulatory Vitals:   05/03/22 0854  BP: 135/70  Pulse: 87  Temp: (!) 96.4 F (35.8 C)  SpO2: 99%   Filed Weights   05/03/22 0854  Weight: 250 lb 3.2 oz (113.5 kg)    Physical Exam Constitutional:      General: He is not in acute distress.    Comments: Patient ambulates independently  HENT:     Head: Normocephalic and atraumatic.  Eyes:     General: No scleral icterus.    Pupils: Pupils are equal, round, and reactive to light.  Cardiovascular:     Rate and Rhythm: Normal rate and regular rhythm.     Heart sounds: Normal heart sounds.  Pulmonary:     Effort: Pulmonary effort is normal. No respiratory distress.     Breath sounds: No wheezing.  Abdominal:     General: Bowel sounds are normal. There is no distension.     Palpations: Abdomen is soft. There is no mass.     Tenderness: There is no abdominal tenderness.     Comments:    Musculoskeletal:        General: No deformity. Normal range of motion.     Cervical back: Normal range of motion and neck supple.     Comments: Left lower extremity edema-chronic   Skin:     General: Skin is warm and dry.  Neurological:     Mental Status: He is alert and oriented to person, place, and time. Mental status is at baseline.     Cranial Nerves: No cranial nerve deficit.     Coordination: Coordination normal.  Psychiatric:        Mood and Affect: Mood normal.     LABORATORY DATA:  I have reviewed the data as listed    Latest Ref Rng & Units 05/03/2022    8:49 AM 04/20/2022   10:30 AM 04/10/2022    8:49 AM  CBC  WBC 4.0 - 10.5 K/uL 3.4  3.2  4.1   Hemoglobin 13.0 - 17.0 g/dL 11.3  11.4  11.3   Hematocrit 39.0 - 52.0 % 33.9  35.3  34.1   Platelets 150 - 400 K/uL 142  143  149  Latest Ref Rng & Units 05/03/2022    8:49 AM 04/20/2022   10:30 AM 04/10/2022    8:49 AM  CMP  Glucose 70 - 99 mg/dL 156  138  110   BUN 8 - 23 mg/dL 36  36  24   Creatinine 0.61 - 1.24 mg/dL 1.41  1.42  1.44   Sodium 135 - 145 mmol/L 136  139  137   Potassium 3.5 - 5.1 mmol/L 4.5  4.6  4.3   Chloride 98 - 111 mmol/L 107  110  109   CO2 22 - 32 mmol/L '21  22  21   '$ Calcium 8.9 - 10.3 mg/dL 8.9  9.3  8.5   Total Protein 6.5 - 8.1 g/dL 6.9  7.3  6.6   Total Bilirubin 0.3 - 1.2 mg/dL 0.5  0.5  0.4   Alkaline Phos 38 - 126 U/L 74  68  68   AST 15 - 41 U/L 36  29  28   ALT 0 - 44 U/L 32  30  28      RADIOGRAPHIC STUDIES: I have personally reviewed the radiological images as listed and agreed with the findings in the report. DG Chest Portable 1 View  Result Date: 02/21/2022 CLINICAL DATA:  Shortness of breath EXAM: PORTABLE CHEST 1 VIEW COMPARISON:  09/14/2021 FINDINGS: Left-sided implanted cardiac device and right chest port remain in place. Stable heart size status post sternotomy and CABG. No focal airspace consolidation, pleural effusion, or pneumothorax. IMPRESSION: No active disease. Electronically Signed   By: Davina Poke D.O.   On: 02/21/2022 13:43

## 2022-05-03 NOTE — Telephone Encounter (Signed)
Spoke with Edward Pitts and reviewed the new instructions for his capecitabine. He started he understanding of how to take the medication. He will start his capecitabine cycle on 05/08/22.

## 2022-05-03 NOTE — Assessment & Plan Note (Signed)
Continue monitor

## 2022-05-03 NOTE — Telephone Encounter (Signed)
Oral Oncology Patient Advocate Encounter  After completing a benefits investigation, prior authorization for Capecitabine is not required at this time through [place].  Patient's copay is $12.61 for 150 mg. Patient's copay is $14.00 for 500 mg.     Berdine Addison, [facility] [place]  [phone] (phone) [phone] (fax) 05/03/2022 1:22 PM

## 2022-05-03 NOTE — Assessment & Plan Note (Addendum)
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  S/p chemotherapy Xeloda '825mg'$ /m2 BID+ RT Labs are reviewed and discussed with patient. plan additional 4 months of Xeloda [1000 mg/m2 BID for 14 of every 21 days]  Patient has upcoming image scan arranged in February at [facility]. I plan to see patient 1 week after starting on adjuvant Xeloda

## 2022-05-04 ENCOUNTER — Encounter: Payer: Self-pay | Admitting: Oncology

## 2022-05-04 ENCOUNTER — Other Ambulatory Visit (HOSPITAL_COMMUNITY): Payer: Self-pay

## 2022-05-04 ENCOUNTER — Encounter (HOSPITAL_COMMUNITY): Payer: Self-pay

## 2022-05-05 ENCOUNTER — Encounter: Payer: Self-pay | Admitting: Oncology

## 2022-05-05 ENCOUNTER — Other Ambulatory Visit (HOSPITAL_BASED_OUTPATIENT_CLINIC_OR_DEPARTMENT_OTHER): Payer: Self-pay

## 2022-05-09 ENCOUNTER — Other Ambulatory Visit: Payer: Self-pay | Admitting: Oncology

## 2022-05-09 DIAGNOSIS — C221 Intrahepatic bile duct carcinoma: Secondary | ICD-10-CM | POA: Diagnosis not present

## 2022-05-12 ENCOUNTER — Encounter: Payer: Self-pay | Admitting: Oncology

## 2022-05-12 ENCOUNTER — Ambulatory Visit (INDEPENDENT_AMBULATORY_CARE_PROVIDER_SITE_OTHER): Payer: PRIVATE HEALTH INSURANCE | Admitting: Cardiovascular Disease

## 2022-05-12 DIAGNOSIS — Z0000 Encounter for general adult medical examination without abnormal findings: Secondary | ICD-10-CM | POA: Diagnosis not present

## 2022-05-15 ENCOUNTER — Encounter: Payer: Self-pay | Admitting: Oncology

## 2022-05-15 ENCOUNTER — Other Ambulatory Visit: Payer: Self-pay | Admitting: Cardiovascular Disease

## 2022-05-15 DIAGNOSIS — Z0000 Encounter for general adult medical examination without abnormal findings: Secondary | ICD-10-CM

## 2022-05-16 ENCOUNTER — Inpatient Hospital Stay (HOSPITAL_BASED_OUTPATIENT_CLINIC_OR_DEPARTMENT_OTHER): Payer: 59 | Admitting: Oncology

## 2022-05-16 ENCOUNTER — Inpatient Hospital Stay: Payer: 59

## 2022-05-16 ENCOUNTER — Encounter: Payer: Self-pay | Admitting: Oncology

## 2022-05-16 VITALS — BP 114/53 | HR 63 | Temp 97.6°F | Resp 18 | Wt 252.4 lb

## 2022-05-16 DIAGNOSIS — C221 Intrahepatic bile duct carcinoma: Secondary | ICD-10-CM

## 2022-05-16 DIAGNOSIS — D631 Anemia in chronic kidney disease: Secondary | ICD-10-CM

## 2022-05-16 DIAGNOSIS — C438 Malignant melanoma of overlapping sites of skin: Secondary | ICD-10-CM

## 2022-05-16 DIAGNOSIS — Z5111 Encounter for antineoplastic chemotherapy: Secondary | ICD-10-CM

## 2022-05-16 DIAGNOSIS — N1832 Chronic kidney disease, stage 3b: Secondary | ICD-10-CM

## 2022-05-16 DIAGNOSIS — N1831 Chronic kidney disease, stage 3a: Secondary | ICD-10-CM

## 2022-05-16 DIAGNOSIS — Z452 Encounter for adjustment and management of vascular access device: Secondary | ICD-10-CM | POA: Diagnosis not present

## 2022-05-16 LAB — COMPREHENSIVE METABOLIC PANEL
ALT: 38 U/L (ref 0–44)
AST: 32 U/L (ref 15–41)
Albumin: 4 g/dL (ref 3.5–5.0)
Alkaline Phosphatase: 76 U/L (ref 38–126)
Anion gap: 7 (ref 5–15)
BUN: 39 mg/dL — ABNORMAL HIGH (ref 8–23)
CO2: 20 mmol/L — ABNORMAL LOW (ref 22–32)
Calcium: 9.1 mg/dL (ref 8.9–10.3)
Chloride: 109 mmol/L (ref 98–111)
Creatinine, Ser: 1.44 mg/dL — ABNORMAL HIGH (ref 0.61–1.24)
GFR, Estimated: 55 mL/min — ABNORMAL LOW (ref >60)
Glucose, Bld: 106 mg/dL — ABNORMAL HIGH (ref 70–99)
Potassium: 5.4 mmol/L — ABNORMAL HIGH (ref 3.5–5.1)
Sodium: 136 mmol/L (ref 135–145)
Total Bilirubin: 0.5 mg/dL (ref 0.3–1.2)
Total Protein: 7.1 g/dL (ref 6.5–8.1)

## 2022-05-16 LAB — CBC WITH DIFFERENTIAL/PLATELET
Abs Immature Granulocytes: 0.04 10*3/uL (ref 0.00–0.07)
Basophils Absolute: 0 10*3/uL (ref 0.0–0.1)
Basophils Relative: 0 %
Eosinophils Absolute: 0.1 10*3/uL (ref 0.0–0.5)
Eosinophils Relative: 2 %
HCT: 31.9 % — ABNORMAL LOW (ref 39.0–52.0)
Hemoglobin: 10.8 g/dL — ABNORMAL LOW (ref 13.0–17.0)
Immature Granulocytes: 1 %
Lymphocytes Relative: 8 %
Lymphs Abs: 0.3 10*3/uL — ABNORMAL LOW (ref 0.7–4.0)
MCH: 32 pg (ref 26.0–34.0)
MCHC: 33.9 g/dL (ref 30.0–36.0)
MCV: 94.4 fL (ref 80.0–100.0)
Monocytes Absolute: 0.5 10*3/uL (ref 0.1–1.0)
Monocytes Relative: 13 %
Neutro Abs: 2.9 10*3/uL (ref 1.7–7.7)
Neutrophils Relative %: 76 %
Platelets: 162 10*3/uL (ref 150–400)
RBC: 3.38 MIL/uL — ABNORMAL LOW (ref 4.22–5.81)
RDW: 14.6 % (ref 11.5–15.5)
WBC: 3.9 10*3/uL — ABNORMAL LOW (ref 4.0–10.5)
nRBC: 0 % (ref 0.0–0.2)

## 2022-05-16 MED ORDER — HEPARIN SOD (PORK) LOCK FLUSH 100 UNIT/ML IV SOLN
500.0000 [IU] | Freq: Once | INTRAVENOUS | Status: AC
  Administered 2022-05-16: 500 [IU] via INTRAVENOUS
  Filled 2022-05-16: qty 5

## 2022-05-16 MED ORDER — SODIUM CHLORIDE 0.9% FLUSH
10.0000 mL | Freq: Once | INTRAVENOUS | Status: AC
  Administered 2022-05-16: 10 mL via INTRAVENOUS
  Filled 2022-05-16: qty 10

## 2022-05-16 NOTE — Assessment & Plan Note (Addendum)
Hemoglobin slightly lower, secondary to chemotherapy.  Continue monitor.

## 2022-05-16 NOTE — Assessment & Plan Note (Addendum)
Encourage oral hydration and avoid nephrotoxins.  Hyperkalemia, recommend low K diet.

## 2022-05-16 NOTE — Assessment & Plan Note (Signed)
Patient has no radiographic evidence of metastatic melanoma. Off immunotherapy. Continue surveillance

## 2022-05-16 NOTE — Assessment & Plan Note (Signed)
Chemotherapy plan as listed above 

## 2022-05-16 NOTE — Progress Notes (Signed)
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  S/p chemotherapy Xeloda 871m/m2 BID+ concurrent RT Currently on adjuvant Xeloda [1000 mg/m2 BID for 14 of every 21 days]  Labs are reviewed and discussed with patient.  He tolerates chemotherapy well. Recommend patient to finish current cycle of Xeloda    Anemia in chronic kidney disease Hemoglobin slightly lower, secondary to chemotherapy.  Continue monitor.   Encounter for antineoplastic chemotherapy Chemotherapy plan as listed above.   Malignant melanoma of overlapping sites ([facility] Patient has no radiographic evidence of metastatic melanoma. Off immunotherapy. Continue surveillance    Orders Placed This Encounter  Procedures   CBC with Differential (CRamerOnly)    Standing Status:   Future    Standing Expiration Date:   05/17/2023   Comprehensive metabolic panel    Standing Status:   Future    Standing Expiration Date:   05/17/2023    All questions were answered. The patient knows to call the clinic with any problems, questions or concerns.  ZEarlie Server MD, PhD [facility] 05/16/2022    HISTORY OF PRESENTING ILLNESS:   Edward Matsuiis a  62y.o.  male presents for recurrent malignant melanoma.   Oncology History  Malignant melanoma of  overlapping sites ([facility]  04/16/2019 Cancer Staging   Staging form: Melanoma of the Skin, AJCC 8th Edition - Pathologic: Stage Unknown (rpTX, pN1b, cM0) - Signed by YEarlie Server MD on 07/27/2020 Stage prefix: Recurrence    04/23/2019 Initial Diagnosis   Malignant melanoma   -He has a history of left lower extremity melanoma in 2011, status post local excision -04/16/2019 patient underwent left groin mass resection  Resection pathology showed malignant melanoma, replacing a lymph node, with extracapsular extension, peripheral and deep margins involved.  Left inguinal contents, all 7 lymph nodes were negative for melanoma in the lymph nodes. Extranodal melanoma identified in lymphatic and interstitium between nodes -PDL1 80% TPS    07/07/2019 -  Radiation Therapy   status post adjuvant radiation.   07/23/2019 - 07/21/2021 Chemotherapy   Nivolumab q14d      06/29/2020 Imaging   CT chest abdomen pelvis showed stable postoperative appearance of the left groin.  No evidence of local recurrence.  No evidence of metastatic disease in the chest abdomen or pelvis.  Hepatic steatosis.  Stable subcentimeter fluid attenuation lesion of the lateral right lobe of the liver, likely benign cyst or hemangioma.  Coronary artery disease.  Aortic atherosclerosis   10/20/2020 Imaging   CT chest abdomen pelvis showed stable postoperative/radiation appearance of the left groin.  No evidence of local recurrence/metastatic disease within the chest abdomen/pelvis.  Fatty liver disease.  Diverticulosis without evidence of typhlitis.  Aortic atherosclerosis   03/10/2021 Imaging   MRI brain without contrast showed no definitive evidence of intracranial metastatic disease.  Study is limited by absence of intravenous contrast.     04/26/2021 Imaging  CT chest abdomen pelvis without contrast showed stable post operative changes of left groin with no evidence of recurrent disease.  No evidence of metastatic disease in the chest  abdomen pelvis.  Aortic atherosclerosis   08/08/2021 - 08/16/2021 Hospital Admission    patient was hospitalized due to NSTEMI status post CABG x3.  He also had pacemaker The echocardiogram showed left ventricular ejection fraction of 65 to 70%,   09/15/2021 Imaging   CT chest abdomen pelvis w contrast  IMPRESSION: 1. Subtle hypodense 9 mm lesion in the left lobe of the liver is new from prior imaging including previous contrasted CT dating back to December 10, 2019, with the lesion appearing to equilibrate with background liver on delayed imaging sequence but is incompletely evaluated on this imaging study and technically nonspecific possibly reflecting a benign perfusional variant and while its appearance is not typical for that of a melanoma metastasis, it is not excluded on this examination. Suggest more definitive characterization by hepatic protocol MRI with and without contrast. 2. Stable postoperative changes in the left groin without evidence of local recurrent disease.3. No evidence of metastatic disease in the chest or pelvis.4.  Aortic Atherosclerosis (ICD10-I70.0).      09/23/2021 Imaging   Contrast-enhanced liver ultrasound Mildly hypoenhancing 2.2 cm mass in the posterior aspect of the left lobe of the liver with washout characteristics concerning for non hepatocellular malignancy, concerning for melanotic metastasis given history. The lesion is in an unfavorable location for percutaneous biopsy. Consider PET-CT for further characterization   10/21/2021 -  Chemotherapy   Nivolumab q14d      11/10/2021 Imaging   MRI Brain w wo contrast Negative for metastatic disease to the brain   11/10/2021 Imaging   MRI abdomen w wo contrast 1. Lesion of the posterior superior left lobe of the liver, hepatic segment II, measuring 1.9 x 1.8 cm corresponding to findings of prior imaging. Evaluation is somewhat limited breath motion artifact however there is subtle associated rim enhancement of  this lesion. Findings are most in keeping with a hepatic metastasis in the setting of known recurrent melanoma. 2. Mild hepatic steatosis. 3. Cardiomegaly.    02/27/2022 -  Chemotherapy   Patient is on Treatment Plan : Capecitabine (825 mg/m2 bid) + XRT     Cholangiocarcinoma ([place])  12/19/2021 Initial Diagnosis   Liver biopsy showed carcinoma  -The slides on the patient's prior left inguinal lymph node biopsy [id number]) with metastatic melanoma were reviewed in conjunction with  this case. The morphology of the malignant cells between the two cases are dissimilar. A limited panel of immunohistochemical stains was performed and the neoplastic cells are positive for superpancytokeratin, cytokeratin 7 (diffuse, strong), cytokeratin 20 (patchy, moderate) and negative for S100, SOX-10, and HepPar-1.These findings are consistent with carcinoma. The morphologic findings and pattern of immunohistochemical staining are non-specific and possible sites of origin include but are not limited to pancreaticobiliary tract, GI tract, prostate, kidney, lung, and breast. Per [facility], the patient had a PET scan performed in July 2023 which demonstrated a single hepatic  hypermetabolic lesion without other sites of disease.    01/19/2022 Surgery   Liver lesion resection at [facility] by Dr.Zani  A.  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  Tumor extends to hepatic parenchymal margin, but all other margins are negative for tumor. See synoptic report and comment.   Background liver with: Mild steatosis (20%).  No definitive evidence of steatohepatitis.  Mild periportal fibrosis (trichrome and reticulin). No stainable iron (Prussian blue stain).  No evidence to support alpha-1-antitrypsin deficiency on PAS-D stain.   pT1a pNx   01/25/2022 Cancer Staging   Staging form: Intrahepatic Bile Duct, AJCC 8th Edition - Pathologic stage from 01/25/2022:  Stage Unknown (pT1a, pNX, cM0) - Signed by Edward Server, MD on 02/21/2022 Stage prefix: Initial diagnosis   03/23/2022 - 05/01/2022 Radiation Therapy   Concurrent Xeloda 858m/m2 BID + Radiation.    05/08/2022 -  Chemotherapy   Xeloda [1000 mg/m2 BID for 14 of every 21 days], plan 4 months.    05/09/2022 [facility]   [facility] post [facility] left hep[facility] without CT evidence of recurrent or metastatic disease in the chest, abdomen, pelvis.      04/18/2021, patient establish care with neurology Dr. VMickeal Skinnerfor intermittent altered cognition.  he was recommended to start Vimpat.    INTERVAL HISTORY SSoroush Daiis a 62y.o. male who has above history reviewed by me today presents for follow up visit for management of inguinal nodal recurrence of melanoma, resected T1a cholangiocarcinoma.  He tolerates adjuvant Xeloda with radiation. +nausea, antiemetics PRN helps.  Denies any nausea, vomiting, diarrhea. No new complaints.    :Review of Systems  Constitutional:  Negative for appetite change, chills, fatigue, fever and unexpected weight change.  HENT:   Negative for hearing loss and voice change.   Eyes:  Negative for eye problems and icterus.  Respiratory:  Negative for chest tightness, cough and shortness of breath.   Cardiovascular:  Positive for leg swelling. Negative for chest pain.  Gastrointestinal:  Negative for abdominal distention and abdominal pain.  Endocrine: Negative for hot flashes.  Genitourinary:  Negative for difficulty urinating, dysuria and frequency.   Musculoskeletal:        Status post left hip replacement  Skin:  Negative for itching and rash.       Skin hypo-pigmentation on upper extremities, no change  Neurological:  Negative for light-headedness and numbness.       Spells  Hematological:  Negative for adenopathy. Does not bruise/bleed easily.  Psychiatric/Behavioral:  Negative for confusion.     MEDICAL HISTORY:   Past Medical History:  Diagnosis Date   Anemia    iron treatments   Anxiety    Aortic atherosclerosis (HCC)    Arthritis    [place]   left groin, resected, radiation   Cataract    Complication of anesthesia    PONV   Coronary artery disease    Dizziness of unknown etiology    has led to seizures and passing out.   Family history of adverse reaction to anesthesia    PONV mother   GERD (gastroesophageal reflux disease)    History of complete heart block    PPM placed   Hyperlipidemia    Hypertension    LBBB (left bundle branch block)    Lymphedema of left leg    uses thigh high compression stockings   [place]   skin cancer, left thigh   OSA on CPAP    PONV (postoperative nausea and vomiting) 04/16/2019   Port-A-Cath in place    RIGHT chest wall   Presence of cardiac pacemaker    [employer]   Seizures ([place]    still has episodes of dizziness. last event 1 month ago (march 2022) and will pass out. takes clonazepam    SURGICAL HISTORY: Past Surgical History:  Procedure Laterality Date   CORONARY ARTERY BYPASS GRAFT N/A 08/10/2021   Procedure: CORONARY ARTERY BYPASS GRAFTING (CABG) X 3 USING LEFT INTERNAL MAMMARY ARTERY AND RIGHT GREATER SAPHENOUS VEIN;  Surgeon: Dahlia Byes, MD;  Location: [place];  Service: Open Heart Surgery;  Laterality: N/A;   CT RADIATION THERAPY GUIDE     left groin   dental implant     permanent implant   ENDOVEIN HARVEST OF GREATER SAPHENOUS VEIN Right 08/10/2021   Procedure: ENDOVEIN HARVEST OF GREATER SAPHENOUS VEIN;  Surgeon: Dahlia Byes, MD;  Location: [place];  Service: Open Heart Surgery;  Laterality: Right;   KNEE SURGERY Left    arthroscopy   LEFT HEART CATH AND CORONARY ANGIOGRAPHY Left 06/29/2017   Procedure: LEFT HEART CATH AND CORONARY ANGIOGRAPHY;  Surgeon: [name], MD;  Location: [place] [facility];  Service: Cardiovascular;  Laterality: Left;   LEFT HEART CATH AND CORONARY ANGIOGRAPHY  N/A 08/08/2021   Procedure: LEFT HEART CATH AND CORONARY ANGIOGRAPHY;  Surgeon: [name], MD;  Location: [place] [facility];  Service: Cardiovascular;  Laterality: N/A;   LYMPH NODE DISSECTION Left 04/16/2019   Procedure: Left inguinal Lymph Node Dissection;  Surgeon: [name], MD;  Location: [place];  Service: General;  Laterality: Left;   MELANOMA EXCISION Left 04/16/2019   Procedure: MELANOMA EXCISION LEFT GROIN MASS;  Surgeon: [name], MD;  Location: [place];  Service: General;  Laterality: Left;   MELANOMA EXCISION WITH SENTINEL LYMPH NODE BIOPSY Left 2012   Left calf    PACEMAKER INSERTION N/A 08/26/2018   Procedure: INSERTION PACEMAKER;  Surgeon: [name], MD;  Location: [facility];  Service: Cardiovascular;  Laterality: N/A;   PORTA CATH INSERTION N/A 08/26/2019   Procedure: PORTA CATH INSERTION;  Surgeon: Katha Cabal, MD;  Location: [place] [facility];  Service: Cardiovascular;  Laterality: N/A;   SUPERFICIAL LYMPH NODE BIOPSY / EXCISION Left 2020   lymph nodes removed around left groin melanoma site   TEE WITHOUT CARDIOVERSION N/A 08/10/2021   Procedure: TRANSESOPHAGEAL ECHOCARDIOGRAM (TEE);  Surgeon: Dahlia Byes, MD;  Location: [place];  Service: Open Heart Surgery;  Laterality: N/A;   TEMPORARY PACEMAKER N/A 08/25/2018   Procedure: TEMPORARY PACEMAKER;  Surgeon: [name], MD;  Location: [place] [facility];  Service: Cardiovascular;  Laterality: N/A;   TOTAL HIP ARTHROPLASTY Left 07/14/2020   Procedure: TOTAL HIP ARTHROPLASTY;  Surgeon: Dereck Leep, MD;  Location: [facility];  Service: Orthopedics;  Laterality: Left;    SOCIAL HISTORY: Social History   Socioeconomic History   Marital status: Married    Spouse name: Vicente Males    Number of children: 7   Years of education: 12   Highest education level: Not on file  Occupational History    Comment: disability  Tobacco Use   Smoking status: Never   Smokeless tobacco: Never  Vaping Use    Vaping Use: Never used  Substance and Sexual Activity   Alcohol use: No   Drug use: No   Sexual activity: Not Currently  Other Topics Concern   Not on file  Social History Narrative   Lives with  Wife,   Has 2 small dogs   Caffeine use: sodas (2 per day)      Out of work on disability.  Has a walk in shower. No stairs to climb   Oncology treatment ongoing. Uses port a cath for treatment.      pacemaker   Social Determinants of Health  Financial Resource Strain: Low Risk  (02/12/2019)   Overall Financial Resource Strain (CARDIA)    Difficulty of Paying Living Expenses: Not hard at all  Food Insecurity: Food Insecurity Present (02/21/2022)   Hunger Vital Sign    Worried About Running Out of Food in the Last Year: Sometimes true    Ran Out of Food in the Last Year: Often true  Transportation Needs: No Transportation Needs (02/21/2022)   PRAPARE - Hydrologist (Medical): No    Lack of Transportation (Non-Medical): No  Physical Activity: Unknown (02/12/2019)   Exercise Vital Sign    Days of Exercise per Week: 0 days    Minutes of Exercise per Session: Not on file  Stress: No Stress Concern Present (02/12/2019)   [place]    Feeling of Stress : Only a little  Social Connections: Unknown (02/12/2019)   Social Connection and Isolation Panel [NHANES]    Frequency of Communication with Friends and Family: More than three times a week    Frequency of Social Gatherings with Friends and Family: Not on file    Attends Religious Services: Not on file    Active Member of Clubs or Organizations: Not on file    Attends Archivist Meetings: Not on file    Marital Status: Married  Intimate Partner Violence: Not At Risk (02/21/2022)   Humiliation, Afraid, Rape, and Kick questionnaire    Fear of Current or Ex-Partner: No    Emotionally Abused: No    Physically Abused: No    Sexually  Abused: No    FAMILY HISTORY: Family History  Problem Relation Age of Onset   Cancer Paternal Grandmother     ALLERGIES:  is allergic to ibuprofen, levetiracetam, and nsaids.  MEDICATIONS:  Current Outpatient Medications  Medication Sig Dispense Refill   apixaban (ELIQUIS) 5 MG TABS tablet Take 5 mg by mouth 2 (two) times daily.     aspirin 81 MG EC tablet Take 81 mg by mouth daily.     atorvastatin (LIPITOR) 20 MG tablet Take 20 mg by mouth daily.     Calcium Carb-Cholecalciferol (OYSTER SHELL CALCIUM W/D) 500-5 MG-MCG TABS TAKE 1 TABLET BY MOUTH EVERY DAY 30 tablet 0   capecitabine (XELODA) 150 MG tablet Take 2 tablets (300 mg total) by mouth 2 (two) times daily after a meal. Take for 14 days, then hold for 7 days. Repeat every 21 days. Take along with 557m tablets. 56 tablet 0   capecitabine (XELODA) 500 MG tablet Take 4 tablets (2,000 mg total) by mouth 2 (two) times daily after a meal. Take for 14 days, then hold for 7 days. Repeat every 21 days. Take along with 1570mtablets. 112 tablet 0   Carboxymeth-Glyc-Polysorb PF (REFRESH OPTIVE MEGA-3) 0.5-1-0.5 % SOLN Place 1 drop into both eyes daily as needed (dry eyes).     clonazePAM (KLONOPIN) 0.5 MG tablet TAKE 1 TABLET BY MOUTH IN THE MORNING AND 2 TABS BY MOUTH IN THE EVENING 90 tablet 0   febuxostat (ULORIC) 40 MG tablet Take 40 mg by mouth daily.     hydrochlorothiazide (HYDRODIURIL) 25 MG tablet Take 1 tablet (25 mg total) by mouth daily. 30 tablet 0   hydrocortisone 2.5 % ointment Apply 1 application. topically daily as needed (itching).     Lacosamide 100 MG TABS TAKE 1 TABLET (100 MG TOTAL) BY MOUTH IN THE MORNING AND AT BEDTIME. 60 tablet 3  lidocaine-prilocaine (EMLA) cream Apply 1 application. topically as needed. Apply small amount of cream to port site approx 1-2 hours prior to appointment. 30 g 11   lisinopril (ZESTRIL) 20 MG tablet Take 1 tablet by mouth daily.     metoprolol succinate (TOPROL-XL) 25 MG 24 hr tablet  Take 25 mg by mouth daily.     Multiple Vitamin (MULTIVITAMIN WITH MINERALS) TABS tablet Take 1 tablet by mouth daily. Centrum Silver     omeprazole (PRILOSEC) 20 MG capsule TAKE 1 CAPSULE BY MOUTH EVERY DAY 90 capsule 0   ondansetron (ZOFRAN) 4 MG tablet TAKE 1 TABLET BY MOUTH EVERY 8 HOURS AS NEEDED FOR NAUSEA AND VOMITING 90 tablet 1   traZODone (DESYREL) 50 MG tablet TAKE 1 TABLET BY MOUTH EVERY DAY AT BEDTIME AS NEEDED FOR SLEEP 90 tablet 1   ferrous sulfate 325 (65 FE) MG EC tablet TAKE 1 TABLET BY MOUTH 2 TIMES DAILY WITH A MEAL. (Patient not taking: Reported on 05/16/2022) 180 tablet 1   No current facility-administered medications for this visit.   Facility-Administered Medications Ordered in Other Visits  Medication Dose Route Frequency Provider Last Rate Last Admin   heparin lock flush 100 UNIT/ML injection            heparin lock flush 100 UNIT/ML injection              PHYSICAL EXAMINATION: ECOG PERFORMANCE STATUS: 1 - Symptomatic but completely ambulatory Vitals:   05/16/22 1027  BP: (!) 114/53  Pulse: 63  Resp: 18  Temp: 97.6 F (36.4 C)   Filed Weights   05/16/22 1027  Weight: 252 lb 6.4 oz (114.5 kg)    Physical Exam Constitutional:      General: He is not in acute distress.    Comments: Patient ambulates independently  HENT:     Head: Normocephalic and atraumatic.  Eyes:     General: No scleral icterus.    Pupils: Pupils are equal, round, and reactive to light.  Cardiovascular:     Rate and Rhythm: Normal rate and regular rhythm.     Heart sounds: Normal heart sounds.  Pulmonary:     Effort: Pulmonary effort is normal. No respiratory distress.     Breath sounds: No wheezing.  Abdominal:     General: Bowel sounds are normal. There is no distension.     Palpations: Abdomen is soft. There is no mass.     Tenderness: There is no abdominal tenderness.     Comments:    Musculoskeletal:        General: No deformity. Normal range of motion.     Cervical  back: Normal range of motion and neck supple.     Comments: Left lower extremity edema-chronic   Skin:    General: Skin is warm and dry.  Neurological:     Mental Status: He is alert and oriented to person, place, and time. Mental status is at baseline.     Cranial Nerves: No cranial nerve deficit.     Coordination: Coordination normal.  Psychiatric:        Mood and Affect: Mood normal.     LABORATORY DATA:  I have reviewed the data as listed    Latest Ref Rng & Units 05/16/2022    9:54 AM 05/03/2022    8:49 AM 04/20/2022   10:30 AM  CBC  WBC 4.0 - 10.5 K/uL 3.9  3.4  3.2   Hemoglobin 13.0 - 17.0 g/dL 10.8  11.3  11.4   Hematocrit 39.0 - 52.0 % 31.9  33.9  35.3   Platelets 150 - 400 K/uL 162  142  143       Latest Ref Rng & Units 05/16/2022    9:54 AM 05/03/2022    8:49 AM 04/20/2022   10:30 AM  CMP  Glucose 70 - 99 mg/dL 106  156  138   BUN 8 - 23 mg/dL 39  36  36   Creatinine 0.61 - 1.24 mg/dL 1.44  1.41  1.42   Sodium 135 - 145 mmol/L 136  136  139   Potassium 3.5 - 5.1 mmol/L 5.4  4.5  4.6   Chloride 98 - 111 mmol/L 109  107  110   CO2 22 - 32 mmol/L 20  21  22   $ Calcium 8.9 - 10.3 mg/dL 9.1  8.9  9.3   Total Protein 6.5 - 8.1 g/dL 7.1  6.9  7.3   Total Bilirubin 0.3 - 1.2 mg/dL 0.5  0.5  0.5   Alkaline Phos 38 - 126 U/L 76  74  68   AST 15 - 41 U/L 32  36  29   ALT 0 - 44 U/L 38  32  30      RADIOGRAPHIC STUDIES: I have personally reviewed the radiological images as listed and agreed with the findings in the report. PCV ECHOCARDIOGRAM COMPLETE  Result Date: 05/16/2022 Images from the original result were not included. Reason for Visit  INDICATIONS:   The patient is Z00.00. Echocardiogram: An echocardiogram in (2-d) mode was performed and in Doppler mode with color flow velocity mapping was performed. ventricular septum thickness 1.23 cm, L ventricular posterior wall thickness (diastole) 1.22 cm, left atrium size 4.2 cm, aortic root diameter 3.6 cm, L ventricle  diastolic dimension 0000000 cm, L ventricle systolic dimension 123456, L ventricle ejection fraction 55.6 %, and LV fractional shortening 28.8 % L ventricular outflow tract internal diameter 3.2 cm, L ventricular outflow tract flow velocity 1.38 m/s, aortic valve cusps 1.7 cm , aortic valve flow velocity 1.71 (m/sec), aortic valve systolic calculated mean flow gradient 8 mmHg, mitral valve diastolic peak flow velocity E 1.24 m/sec, and mitral valve diastolic peak flow E/A ratio 3.2 % Mitral valve has trace regurgitation Tricuspid valve has trace regurgitation ASSESSMENT Technically adequate study. Mild left ventricular hypertrophy with GRADE 2 (psuedonormalization)  diastolic dysfunction. Normal right ventricular systolic function. Normal right ventricular diastolic function. Normal left ventricular wall motion. Normal right ventricular wall motion. Trace tricuspid regurgitation. Mild pulmonary hypertension. Trace mitral regurgitation. No pericardial effusion. Mildly dilated Left atrium Mild LVH   DG Chest Portable 1 View  Result Date: 02/21/2022 CLINICAL DATA:  Shortness of breath EXAM: PORTABLE CHEST 1 VIEW COMPARISON:  09/14/2021 FINDINGS: Left-sided implanted cardiac device and right chest port remain in place. Stable heart size status post sternotomy and CABG. No focal airspace consolidation, pleural effusion, or pneumothorax. IMPRESSION: No active disease. Electronically Signed   By: Davina Poke D.O.   On: 02/21/2022 13:43

## 2022-05-16 NOTE — Progress Notes (Signed)
Pt here for follow up. No new concerns voiced.   

## 2022-05-16 NOTE — Assessment & Plan Note (Addendum)
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  S/p chemotherapy Xeloda 866m/m2 BID+ concurrent RT Currently on adjuvant Xeloda [1000 mg/m2 BID for 14 of every 21 days]  Labs are reviewed and discussed with patient.  He tolerates chemotherapy well. Recommend patient to finish current cycle of Xeloda

## 2022-05-17 ENCOUNTER — Other Ambulatory Visit (HOSPITAL_COMMUNITY): Payer: Self-pay

## 2022-05-19 ENCOUNTER — Encounter: Payer: Self-pay | Admitting: Medical Oncology

## 2022-05-19 ENCOUNTER — Inpatient Hospital Stay (HOSPITAL_BASED_OUTPATIENT_CLINIC_OR_DEPARTMENT_OTHER): Payer: 59 | Admitting: Medical Oncology

## 2022-05-19 ENCOUNTER — Telehealth: Payer: Self-pay | Admitting: *Deleted

## 2022-05-19 ENCOUNTER — Other Ambulatory Visit (HOSPITAL_COMMUNITY): Payer: Self-pay

## 2022-05-19 VITALS — BP 108/53 | HR 64 | Temp 97.7°F | Wt 252.0 lb

## 2022-05-19 DIAGNOSIS — N1832 Chronic kidney disease, stage 3b: Secondary | ICD-10-CM

## 2022-05-19 DIAGNOSIS — L270 Generalized skin eruption due to drugs and medicaments taken internally: Secondary | ICD-10-CM | POA: Diagnosis not present

## 2022-05-19 DIAGNOSIS — C221 Intrahepatic bile duct carcinoma: Secondary | ICD-10-CM | POA: Diagnosis not present

## 2022-05-19 DIAGNOSIS — C438 Malignant melanoma of overlapping sites of skin: Secondary | ICD-10-CM | POA: Diagnosis not present

## 2022-05-19 DIAGNOSIS — D631 Anemia in chronic kidney disease: Secondary | ICD-10-CM

## 2022-05-19 DIAGNOSIS — Z452 Encounter for adjustment and management of vascular access device: Secondary | ICD-10-CM | POA: Diagnosis not present

## 2022-05-19 DIAGNOSIS — N1831 Chronic kidney disease, stage 3a: Secondary | ICD-10-CM | POA: Diagnosis not present

## 2022-05-19 MED ORDER — PREDNISONE 10 MG (21) PO TBPK: ORAL_TABLET | ORAL | 0 refills | Status: DC

## 2022-05-19 NOTE — Telephone Encounter (Signed)
Capecitabine is what he thinks it is, at firrst he said he didn't know because he is on so many pills , I asked he get his pill bottles and see if he can find wha the thinks it is. That is when he said the Xeloda. He said he can come in today to be seen, just call him with a time

## 2022-05-19 NOTE — Progress Notes (Signed)
[facility] ([facility]) [phone] [phone]  Patient Care Team: Mechele Claude, FNP as PCP - General (Family Medicine) [name], MD as Consulting Physician (Oncology) Mickeal Skinner Acey Lav, MD as Consulting Physician (Oncology) [name], MD as Consulting Physician (Cardiology) Mechele Claude, FNP (Family Medicine)   Name of the patient: Edward Pitts  [id number]  09/25/1960   Oncological History:  Inguinal nodal recurrence of melanoma, resected T1a cholangiocarcinoma   Current Treatment:  S/p chemotherapy Xeloda '825mg'$ /m2 BID+ concurrent RT Currently on adjuvant Xeloda [1000 mg/m2 BID for 14 of every 21 days] (started 05/03/2022) planned for 4 months completing May 2024  Date of visit: 05/19/22  Reason for Consult: Edward Pitts is a 62 y.o. male who presents today for:  Rash: Patient states that he has had a slight rash of the back of his hands for a few weeks now. It has been stable up until yesterday when his wife noticed some redness of his face and upper arms as well. These areas are a bit itchy but are not painful. No bleeding. He denies rash on torso, lags, feet. No blisters or ulcers. He denies any fever, cold symptoms, SOB, facial or tongue swelling. He has not tried anything for symptoms. No new medications/products however his Xeloda was dose adjusted (increased as shown above)on 05/03/2022.   Denies any neurologic complaints. Denies recent fevers or illnesses. Denies any easy bleeding or bruising. Reports good appetite and denies weight loss. Denies chest pain. Denies any nausea, vomiting, constipation, or diarrhea. Denies urinary complaints. Patient offers no further specific complaints today.    PAST MEDICAL HISTORY: Past Medical History:  Diagnosis Date   Anemia    iron treatments   Anxiety    Aortic atherosclerosis ([place])    Arthritis    Cancer of groin ([place]) 2021   left groin,  resected, radiation   Cataract    Complication of anesthesia    PONV   Coronary artery disease    Dizziness of unknown etiology    has led to seizures and passing out.   Family history of adverse reaction to anesthesia    PONV mother   GERD (gastroesophageal reflux disease)    History of complete heart block    PPM placed   Hyperlipidemia    Hypertension    LBBB (left bundle branch block)    Lymphedema of left leg    uses thigh high compression stockings   Melanoma ([place]) 2012   skin cancer, left thigh   OSA on CPAP    PONV (postoperative nausea and vomiting) 04/16/2019   Port-A-Cath in place    RIGHT chest wall   Presence of cardiac pacemaker    [employer]   Seizures ([place])    still has episodes of dizziness. last event 1 month ago (march 2022) and will pass out. takes clonazepam    PAST SURGICAL HISTORY:  Past Surgical History:  Procedure Laterality Date   CORONARY ARTERY BYPASS GRAFT N/A 08/10/2021   Procedure: CORONARY ARTERY BYPASS GRAFTING (CABG) X 3 USING LEFT INTERNAL MAMMARY ARTERY AND RIGHT GREATER SAPHENOUS VEIN;  Surgeon: Dahlia Byes, MD;  Location: [place];  Service: Open Heart Surgery;  Laterality: N/A;   CT RADIATION THERAPY GUIDE     left groin   dental implant     permanent implant   ENDOVEIN HARVEST OF GREATER SAPHENOUS VEIN Right 08/10/2021   Procedure: ENDOVEIN HARVEST OF GREATER SAPHENOUS VEIN;  Surgeon: Dahlia Byes,  MD;  [facility];  Service: Open Heart Surgery;  Laterality: Right;   KNEE SURGERY Left    arthroscopy   LEFT HEART CATH AND CORONARY ANGIOGRAPHY Left 06/29/2017   Procedure: LEFT HEART CATH AND CORONARY ANGIOGRAPHY;  Surgeon: [name], MD;  Location: [place] [facility];  Service: Cardiovascular;  Laterality: Left;   LEFT HEART CATH AND CORONARY ANGIOGRAPHY N/A 08/08/2021   Procedure: LEFT HEART CATH AND CORONARY ANGIOGRAPHY;  Surgeon: [name], MD;  Location: [place] [facility];  Service: Cardiovascular;   Laterality: N/A;   LYMPH NODE DISSECTION Left 04/16/2019   Procedure: Left inguinal Lymph Node Dissection;  Surgeon: [name], MD;  Location: [place];  Service: General;  Laterality: Left;   MELANOMA EXCISION Left 04/16/2019   Procedure: MELANOMA EXCISION LEFT GROIN MASS;  Surgeon: [name], MD;  Location: [place];  Service: General;  Laterality: Left;   MELANOMA EXCISION WITH SENTINEL LYMPH NODE BIOPSY Left 2012   Left calf    PACEMAKER INSERTION N/A 08/26/2018   Procedure: INSERTION PACEMAKER;  Surgeon: [name], MD;  Location: [facility];  Service: Cardiovascular;  Laterality: N/A;   PORTA CATH INSERTION N/A 08/26/2019   Procedure: PORTA CATH INSERTION;  Surgeon: Katha Cabal, MD;  Location: [place] [facility];  Service: Cardiovascular;  Laterality: N/A;   SUPERFICIAL LYMPH NODE BIOPSY / EXCISION Left 2020   lymph nodes removed around left groin melanoma site   TEE WITHOUT CARDIOVERSION N/A 08/10/2021   Procedure: TRANSESOPHAGEAL ECHOCARDIOGRAM (TEE);  Surgeon: Dahlia Byes, MD;  Location: [place];  Service: Open Heart Surgery;  Laterality: N/A;   TEMPORARY PACEMAKER N/A 08/25/2018   Procedure: TEMPORARY PACEMAKER;  Surgeon: [name], MD;  Location: [place] [facility];  Service: Cardiovascular;  Laterality: N/A;   TOTAL HIP ARTHROPLASTY Left 07/14/2020   Procedure: TOTAL HIP ARTHROPLASTY;  Surgeon: Dereck Leep, MD;  Location: [facility];  Service: Orthopedics;  Laterality: Left;    HEMATOLOGY/ONCOLOGY HISTORY:  Oncology History  Malignant melanoma of overlapping sites [facility])  04/16/2019 Cancer Staging   Staging form: Melanoma of the Skin, AJCC 8th Edition - Pathologic: Stage Unknown (rpTX, pN1b, cM0) - Signed by [name], MD on 07/27/2020 Stage prefix: Recurrence    04/23/2019 Initial Diagnosis   Malignant melanoma   -He has a history of left lower extremity melanoma in 2011, status post local excision -04/16/2019 patient underwent left groin mass resection   Resection pathology showed malignant melanoma, replacing a lymph node, with extracapsular extension, peripheral and deep margins involved.  Left inguinal contents, all 7 lymph nodes were negative for melanoma in the lymph nodes. Extranodal melanoma identified in lymphatic and interstitium between nodes -PDL1 80% TPS    07/07/2019 -  Radiation Therapy   status post adjuvant radiation.   07/23/2019 - 07/21/2021 Chemotherapy   Nivolumab q14d      06/29/2020 Imaging   CT chest abdomen pelvis showed stable postoperative appearance of the left groin.  No evidence of local recurrence.  No evidence of metastatic disease in the chest abdomen or pelvis.  Hepatic steatosis.  Stable subcentimeter fluid attenuation lesion of the lateral right lobe of the liver, likely benign cyst or hemangioma.  Coronary artery disease.  Aortic atherosclerosis   10/20/2020 Imaging   CT chest abdomen pelvis showed stable postoperative/radiation appearance of the left groin.  No evidence of local recurrence/metastatic disease within the chest abdomen/pelvis.  Fatty liver disease.  Diverticulosis without evidence of typhlitis.  Aortic atherosclerosis  03/10/2021 Imaging   MRI brain without contrast showed no definitive evidence of intracranial metastatic disease.  Study is limited by absence of intravenous contrast.     04/26/2021 Imaging   CT chest abdomen pelvis without contrast showed stable post operative changes of left groin with no evidence of recurrent disease.  No evidence of metastatic disease in the chest abdomen pelvis.  Aortic atherosclerosis   08/08/2021 - 08/16/2021 Hospital Admission    patient was hospitalized due to NSTEMI status post CABG x3.  He also had pacemaker The echocardiogram showed left ventricular ejection fraction of 65 to 70%,   09/15/2021 Imaging   CT chest abdomen pelvis w contrast  IMPRESSION: 1. Subtle hypodense 9 mm lesion in the left lobe of the liver is new from prior imaging including  previous contrasted CT dating back to December 10, 2019, with the lesion appearing to equilibrate with background liver on delayed imaging sequence but is incompletely evaluated on this imaging study and technically nonspecific possibly reflecting a benign perfusional variant and while its appearance is not typical for that of a melanoma metastasis, it is not excluded on this examination. Suggest more definitive characterization by hepatic protocol MRI with and without contrast. 2. Stable postoperative changes in the left groin without evidence of local recurrent disease.3. No evidence of metastatic disease in the chest or pelvis.4.  Aortic Atherosclerosis (ICD10-I70.0).      09/23/2021 Imaging   Contrast-enhanced liver ultrasound Mildly hypoenhancing 2.2 cm mass in the posterior aspect of the left lobe of the liver with washout characteristics concerning for non hepatocellular malignancy, concerning for melanotic metastasis given history. The lesion is in an unfavorable location for percutaneous biopsy. Consider PET-CT for further characterization   10/21/2021 -  Chemotherapy   Nivolumab q14d      11/10/2021 Imaging   MRI Brain w wo contrast Negative for metastatic disease to the brain   11/10/2021 Imaging   MRI abdomen w wo contrast 1. Lesion of the posterior superior left lobe of the liver, hepatic segment II, measuring 1.9 x 1.8 cm corresponding to findings of prior imaging. Evaluation is somewhat limited breath motion artifact however there is subtle associated rim enhancement of this lesion. Findings are most in keeping with a hepatic metastasis in the setting of known recurrent melanoma. 2. Mild hepatic steatosis. 3. Cardiomegaly.    02/27/2022 -  Chemotherapy   Patient is on Treatment Plan : Capecitabine (825 mg/m2 bid) + XRT     Cholangiocarcinoma ([place])  12/19/2021 Initial Diagnosis   Liver biopsy showed carcinoma  -The slides on the patient's prior left inguinal lymph node biopsy  [id number]) with metastatic melanoma were reviewed in conjunction with  this case. The morphology of the malignant cells between the two cases are dissimilar. A limited panel of immunohistochemical stains was performed and the neoplastic cells are positive for superpancytokeratin, cytokeratin 7 (diffuse, strong), cytokeratin 20 (patchy, moderate) and negative for S100, SOX-10, and HepPar-1.These findings are consistent with carcinoma. The morphologic findings and pattern of immunohistochemical staining are non-specific and possible sites of origin include but are not limited to pancreaticobiliary tract, GI tract, prostate, kidney, lung, and breast. Per [facility], the patient had a PET scan performed in July 2023 which demonstrated a single hepatic  hypermetabolic lesion without other sites of disease.    01/19/2022 Surgery   Liver lesion resection at [facility] by Dr.Zani  A.  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  Tumor extends  to hepatic parenchymal margin, but all other margins are negative for tumor. See synoptic report and comment.   Background liver with: Mild steatosis (20%).  No definitive evidence of steatohepatitis.   Mild periportal fibrosis (trichrome and reticulin). No stainable iron (Prussian blue stain).  No evidence to support alpha-1-antitrypsin deficiency on PAS-D stain.   pT1a pNx   01/25/2022 Cancer Staging   Staging form: Intrahepatic Bile Duct, AJCC 8th Edition - Pathologic stage from 01/25/2022: Stage Unknown (pT1a, pNX, cM0) - Signed by [name], MD on 02/21/2022 Stage prefix: Initial diagnosis   03/23/2022 - 05/01/2022 Radiation Therapy   Concurrent Xeloda '825mg'$ /m2 BID + Radiation.    05/08/2022 -  Chemotherapy   Xeloda [1000 mg/m2 BID for 14 of every 21 days], plan 4 months.    05/09/2022 Imaging   CT chest abdomen pelvis with contrast at [facility] showed Status post partial left hepatectomy without CT  evidence of recurrent or metastatic disease in the chest, abdomen, pelvis.       ALLERGIES:  is allergic to ibuprofen, levetiracetam, and nsaids.  MEDICATIONS:  Current Outpatient Medications  Medication Sig Dispense Refill   apixaban (ELIQUIS) 5 MG TABS tablet Take 5 mg by mouth 2 (two) times daily.     aspirin 81 MG EC tablet Take 81 mg by mouth daily.     atorvastatin (LIPITOR) 20 MG tablet Take 20 mg by mouth daily.     Calcium Carb-Cholecalciferol (OYSTER SHELL CALCIUM W/D) 500-5 MG-MCG TABS TAKE 1 TABLET BY MOUTH EVERY DAY 30 tablet 0   capecitabine (XELODA) 150 MG tablet Take 2 tablets (300 mg total) by mouth 2 (two) times daily after a meal. Take for 14 days, then hold for 7 days. Repeat every 21 days. Take along with '500mg'$  tablets. 56 tablet 0   capecitabine (XELODA) 500 MG tablet Take 4 tablets (2,000 mg total) by mouth 2 (two) times daily after a meal. Take for 14 days, then hold for 7 days. Repeat every 21 days. Take along with '150mg'$  tablets. 112 tablet 0   Carboxymeth-Glyc-Polysorb PF (REFRESH OPTIVE MEGA-3) 0.5-1-0.5 % SOLN Place 1 drop into both eyes daily as needed (dry eyes).     clonazePAM (KLONOPIN) 0.5 MG tablet TAKE 1 TABLET BY MOUTH IN THE MORNING AND 2 TABS BY MOUTH IN THE EVENING 90 tablet 0   febuxostat (ULORIC) 40 MG tablet Take 40 mg by mouth daily.     hydrocortisone 2.5 % ointment Apply 1 application. topically daily as needed (itching).     Lacosamide 100 MG TABS TAKE 1 TABLET (100 MG TOTAL) BY MOUTH IN THE MORNING AND AT BEDTIME. 60 tablet 3   lidocaine-prilocaine (EMLA) cream Apply 1 application. topically as needed. Apply small amount of cream to port site approx 1-2 hours prior to appointment. 30 g 11   lisinopril (ZESTRIL) 20 MG tablet Take 1 tablet by mouth daily.     metoprolol succinate (TOPROL-XL) 25 MG 24 hr tablet Take 25 mg by mouth daily.     Multiple Vitamin (MULTIVITAMIN WITH MINERALS) TABS tablet Take 1 tablet by mouth daily. Centrum Silver      omeprazole (PRILOSEC) 20 MG capsule TAKE 1 CAPSULE BY MOUTH EVERY DAY 90 capsule 0   ondansetron (ZOFRAN) 4 MG tablet TAKE 1 TABLET BY MOUTH EVERY 8 HOURS AS NEEDED FOR NAUSEA AND VOMITING 90 tablet 1   predniSONE (STERAPRED UNI-PAK 21 TAB) 10 MG (21) TBPK tablet Take as directed with breakfast 1 each 0   traZODone (DESYREL) 50  MG tablet TAKE 1 TABLET BY MOUTH EVERY DAY AT BEDTIME AS NEEDED FOR SLEEP 90 tablet 1   ferrous sulfate 325 (65 FE) MG EC tablet TAKE 1 TABLET BY MOUTH 2 TIMES DAILY WITH A MEAL. (Patient not taking: Reported on 05/16/2022) 180 tablet 1   hydrochlorothiazide (HYDRODIURIL) 25 MG tablet Take 1 tablet (25 mg total) by mouth daily. 30 tablet 0   No current facility-administered medications for this visit.   Facility-Administered Medications Ordered in Other Visits  Medication Dose Route Frequency Provider Last Rate Last Admin   heparin lock flush 100 UNIT/ML injection            heparin lock flush 100 UNIT/ML injection             VITAL SIGNS: BP (!) 108/53 (BP Location: Left Arm, Patient Position: Sitting)   Pulse 64   Temp 97.7 F (36.5 C) (Tympanic)   Wt 252 lb (114.3 kg)   SpO2 97%   BMI 39.47 kg/m  Filed Weights   05/19/22 1028  Weight: 252 lb (114.3 kg)    Estimated body mass index is 39.47 kg/m as calculated from the following:   Height as of 05/15/22: '5\' 7"'$  (1.702 m).   Weight as of this encounter: 252 lb (114.3 kg).  LABS: CBC:    Component Value Date/Time   WBC 3.9 (L) 05/16/2022 0954   HGB 10.8 (L) 05/16/2022 0954   HCT 31.9 (L) 05/16/2022 0954   PLT 162 05/16/2022 0954   MCV 94.4 05/16/2022 0954   NEUTROABS 2.9 05/16/2022 0954   LYMPHSABS 0.3 (L) 05/16/2022 0954   MONOABS 0.5 05/16/2022 0954   EOSABS 0.1 05/16/2022 0954   BASOSABS 0.0 05/16/2022 0954   Comprehensive Metabolic Panel:    Component Value Date/Time   NA 136 05/16/2022 0954   K 5.4 (H) 05/16/2022 0954   CL 109 05/16/2022 0954   CO2 20 (L) 05/16/2022 0954   BUN 39 (H)  05/16/2022 0954   CREATININE 1.44 (H) 05/16/2022 0954   GLUCOSE 106 (H) 05/16/2022 0954   CALCIUM 9.1 05/16/2022 0954   AST 32 05/16/2022 0954   ALT 38 05/16/2022 0954   ALKPHOS 76 05/16/2022 0954   BILITOT 0.5 05/16/2022 0954   PROT 7.1 05/16/2022 0954   ALBUMIN 4.0 05/16/2022 0954    RADIOGRAPHIC STUDIES: PCV ECHOCARDIOGRAM COMPLETE  Result Date: 05/16/2022 Images from the original result were not included. Reason for Visit  INDICATIONS:   The patient is Z00.00. Echocardiogram: An echocardiogram in (2-d) mode was performed and in Doppler mode with color flow velocity mapping was performed. ventricular septum thickness 1.23 cm, L ventricular posterior wall thickness (diastole) 1.22 cm, left atrium size 4.2 cm, aortic root diameter 3.6 cm, L ventricle diastolic dimension 0000000 cm, L ventricle systolic dimension [id number], L ventricle ejection fraction 55.6 %, and LV fractional shortening 28.8 % L ventricular outflow tract internal diameter 3.2 cm, L ventricular outflow tract flow velocity 1.38 m/s, aortic valve cusps 1.7 cm , aortic valve flow velocity 1.71 (m/sec), aortic valve systolic calculated mean flow gradient 8 mmHg, mitral valve diastolic peak flow velocity E 1.24 m/sec, and mitral valve diastolic peak flow E/A ratio 3.2 % Mitral valve has trace regurgitation Tricuspid valve has trace regurgitation ASSESSMENT Technically adequate study. Mild left ventricular hypertrophy with GRADE 2 (psuedonormalization)  diastolic dysfunction. Normal right ventricular systolic function. Normal right ventricular diastolic function. Normal left ventricular wall motion. Normal right ventricular wall motion. Trace tricuspid regurgitation. Mild pulmonary hypertension. Trace mitral regurgitation.  No pericardial effusion. Mildly dilated Left atrium Mild LVH    PERFORMANCE STATUS (ECOG) : 1 - Symptomatic but completely ambulatory  Review of Systems Unless otherwise noted, a complete review of systems is  negative.  Physical Exam General: NAD. No swelling of the face. Normal speech  Cardiovascular: regular rate and rhythm Pulmonary: clear ant fields Skin: See below. No rash of torso or legs        Assessment and Plan- Patient is a 62 y.o. male    Encounter Diagnosis  Name Primary?   Drug rash Yes   Acute on chronic drug rash appears secondary to his recent dose increase of his xeloda. Previously grade 1 now grade 2 rash. I would recommend that he stop his xeloda and start prednisone course*no kidney dose adjustment necessary*. Discussed how to take along with common potential side effects and precautions. Luckily he was due to stop his current cycle of xeloda in 2 days in completing his Feb cycle. We will see him back next week to ensure he is improving and then he will return on 05/29/2022 for discussion and consideration of cycle 2 adjuvant xeloda. May benefit with dose reduction in the future.     Patient expressed understanding and was in agreement with this plan. He also understands that He can call clinic at any time with any questions, concerns, or complaints.   Thank you for allowing me to participate in the care of this very pleasant patient.   Time Total: 25  Visit consisted of counseling and education dealing with the complex and emotionally intense issues of symptom management in the setting of serious illness.Greater than 50%  of this time was spent counseling and coordinating care related to the above assessment and plan.  Signed by: Nelwyn Salisbury, PA-C

## 2022-05-19 NOTE — Telephone Encounter (Addendum)
Patient called reporting that the new medicine Dr Tasia Catchings started him on has caused a rash on his hands and is now spreading to his face, He said it itches a littl, but he is not having shortness of breath or swelling. He is asking what to do, stop the medicine or get evaluated for rash. I asked he hold hid dose this morning until I hear back from physician.

## 2022-05-22 ENCOUNTER — Other Ambulatory Visit (HOSPITAL_COMMUNITY): Payer: Self-pay

## 2022-05-24 ENCOUNTER — Inpatient Hospital Stay: Payer: 59 | Admitting: Medical Oncology

## 2022-05-29 ENCOUNTER — Encounter: Payer: Self-pay | Admitting: Oncology

## 2022-05-29 ENCOUNTER — Inpatient Hospital Stay: Payer: 59

## 2022-05-29 ENCOUNTER — Inpatient Hospital Stay: Payer: 59 | Attending: Oncology

## 2022-05-29 ENCOUNTER — Inpatient Hospital Stay (HOSPITAL_BASED_OUTPATIENT_CLINIC_OR_DEPARTMENT_OTHER): Payer: 59 | Admitting: Oncology

## 2022-05-29 ENCOUNTER — Other Ambulatory Visit (HOSPITAL_COMMUNITY): Payer: Self-pay

## 2022-05-29 VITALS — BP 130/65 | HR 73 | Temp 97.5°F | Resp 18 | Wt 250.9 lb

## 2022-05-29 DIAGNOSIS — E785 Hyperlipidemia, unspecified: Secondary | ICD-10-CM | POA: Diagnosis not present

## 2022-05-29 DIAGNOSIS — N1831 Chronic kidney disease, stage 3a: Secondary | ICD-10-CM | POA: Insufficient documentation

## 2022-05-29 DIAGNOSIS — K760 Fatty (change of) liver, not elsewhere classified: Secondary | ICD-10-CM | POA: Insufficient documentation

## 2022-05-29 DIAGNOSIS — L03314 Cellulitis of groin: Secondary | ICD-10-CM | POA: Insufficient documentation

## 2022-05-29 DIAGNOSIS — Z5111 Encounter for antineoplastic chemotherapy: Secondary | ICD-10-CM

## 2022-05-29 DIAGNOSIS — D631 Anemia in chronic kidney disease: Secondary | ICD-10-CM | POA: Diagnosis not present

## 2022-05-29 DIAGNOSIS — R21 Rash and other nonspecific skin eruption: Secondary | ICD-10-CM | POA: Diagnosis not present

## 2022-05-29 DIAGNOSIS — I252 Old myocardial infarction: Secondary | ICD-10-CM | POA: Diagnosis not present

## 2022-05-29 DIAGNOSIS — B3749 Other urogenital candidiasis: Secondary | ICD-10-CM | POA: Diagnosis not present

## 2022-05-29 DIAGNOSIS — C221 Intrahepatic bile duct carcinoma: Secondary | ICD-10-CM

## 2022-05-29 DIAGNOSIS — C438 Malignant melanoma of overlapping sites of skin: Secondary | ICD-10-CM | POA: Diagnosis not present

## 2022-05-29 DIAGNOSIS — Z85820 Personal history of malignant melanoma of skin: Secondary | ICD-10-CM | POA: Insufficient documentation

## 2022-05-29 DIAGNOSIS — Z79899 Other long term (current) drug therapy: Secondary | ICD-10-CM | POA: Diagnosis not present

## 2022-05-29 DIAGNOSIS — N1832 Chronic kidney disease, stage 3b: Secondary | ICD-10-CM

## 2022-05-29 DIAGNOSIS — R569 Unspecified convulsions: Secondary | ICD-10-CM | POA: Insufficient documentation

## 2022-05-29 DIAGNOSIS — Z452 Encounter for adjustment and management of vascular access device: Secondary | ICD-10-CM | POA: Insufficient documentation

## 2022-05-29 LAB — CBC WITH DIFFERENTIAL (CANCER CENTER ONLY)
Abs Immature Granulocytes: 0.08 10*3/uL — ABNORMAL HIGH (ref 0.00–0.07)
Basophils Absolute: 0 10*3/uL (ref 0.0–0.1)
Basophils Relative: 0 %
Eosinophils Absolute: 0.1 10*3/uL (ref 0.0–0.5)
Eosinophils Relative: 2 %
HCT: 32.2 % — ABNORMAL LOW (ref 39.0–52.0)
Hemoglobin: 11 g/dL — ABNORMAL LOW (ref 13.0–17.0)
Immature Granulocytes: 2 %
Lymphocytes Relative: 6 %
Lymphs Abs: 0.3 10*3/uL — ABNORMAL LOW (ref 0.7–4.0)
MCH: 32.7 pg (ref 26.0–34.0)
MCHC: 34.2 g/dL (ref 30.0–36.0)
MCV: 95.8 fL (ref 80.0–100.0)
Monocytes Absolute: 0.7 10*3/uL (ref 0.1–1.0)
Monocytes Relative: 15 %
Neutro Abs: 3.5 10*3/uL (ref 1.7–7.7)
Neutrophils Relative %: 75 %
Platelet Count: 136 10*3/uL — ABNORMAL LOW (ref 150–400)
RBC: 3.36 MIL/uL — ABNORMAL LOW (ref 4.22–5.81)
RDW: 15.6 % — ABNORMAL HIGH (ref 11.5–15.5)
WBC Count: 4.7 10*3/uL (ref 4.0–10.5)
nRBC: 0 % (ref 0.0–0.2)

## 2022-05-29 LAB — COMPREHENSIVE METABOLIC PANEL
ALT: 40 U/L (ref 0–44)
AST: 32 U/L (ref 15–41)
Albumin: 3.8 g/dL (ref 3.5–5.0)
Alkaline Phosphatase: 74 U/L (ref 38–126)
Anion gap: 6 (ref 5–15)
BUN: 41 mg/dL — ABNORMAL HIGH (ref 8–23)
CO2: 18 mmol/L — ABNORMAL LOW (ref 22–32)
Calcium: 8.8 mg/dL — ABNORMAL LOW (ref 8.9–10.3)
Chloride: 111 mmol/L (ref 98–111)
Creatinine, Ser: 1.39 mg/dL — ABNORMAL HIGH (ref 0.61–1.24)
GFR, Estimated: 57 mL/min — ABNORMAL LOW (ref >60)
Glucose, Bld: 110 mg/dL — ABNORMAL HIGH (ref 70–99)
Potassium: 5.1 mmol/L (ref 3.5–5.1)
Sodium: 135 mmol/L (ref 135–145)
Total Bilirubin: 0.6 mg/dL (ref 0.3–1.2)
Total Protein: 6.9 g/dL (ref 6.5–8.1)

## 2022-05-29 MED ORDER — HEPARIN SOD (PORK) LOCK FLUSH 100 UNIT/ML IV SOLN
500.0000 [IU] | Freq: Once | INTRAVENOUS | Status: AC
  Administered 2022-05-29: 500 [IU] via INTRAVENOUS
  Filled 2022-05-29: qty 5

## 2022-05-29 NOTE — Assessment & Plan Note (Signed)
Hemoglobin is stable.  Continue to monitor

## 2022-05-29 NOTE — Assessment & Plan Note (Signed)
Patient has no radiographic evidence of metastatic melanoma. Off immunotherapy. Continue surveillance

## 2022-05-29 NOTE — Progress Notes (Signed)
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  S/p chemotherapy Xeloda '825mg'$ /m2 BID+ concurrent RT Currently on adjuvant Xeloda [1000 mg/m2 BID for 14 of every 21 days]  Labs are reviewed and discussed with patient.  Recommend patient to start cycle 2  Xeloda    Anemia in chronic kidney disease Hemoglobin is stable.  Continue to monitor    Encounter for antineoplastic chemotherapy Chemotherapy plan as listed above.   Malignant melanoma of overlapping sites [facility]) Patient has no radiographic evidence of metastatic melanoma. Off immunotherapy. Continue surveillance  Stage 3a chronic kidney disease ([place]) Encourage oral hydration and avoid nephrotoxins.     Skin rash Bilateral facial and dorsum of hands, possible photosensitivity rash secondary to chemotherapy. Recommend patient apply sunscreen, avoid direct sun exposure.     Orders Placed This Encounter  Procedures   CBC with Differential ([place] Only)    Standing Status:   Future    Standing Expiration Date:   05/29/2023   CMP ([place] only)    Standing Status:   Future    Standing Expiration Date:   05/29/2023   Follow-up 7 to 10 days All questions were answered. The patient knows to call the clinic with any problems, questions or concerns.  [name], MD,  PhD [facility] Hematology Oncology 05/29/2022    HISTORY OF PRESENTING ILLNESS:   Edward Pitts is a  62 y.o.  male presents for recurrent malignant melanoma.   Oncology History  Malignant melanoma of overlapping sites [facility])  04/16/2019 Cancer Staging   Staging form: Melanoma of the Skin, AJCC 8th Edition - Pathologic: Stage Unknown (rpTX, pN1b, cM0) - Signed by [name], MD on 07/27/2020 Stage prefix: Recurrence    04/23/2019 Initial Diagnosis   Malignant melanoma   -He has a history of left lower extremity melanoma in 2011, status post local excision -04/16/2019 patient underwent left groin mass resection  Resection pathology showed malignant melanoma, replacing a lymph node, with extracapsular extension, peripheral and deep margins involved.  Left inguinal contents, all 7 lymph nodes were negative for melanoma in the lymph nodes. Extranodal melanoma identified in lymphatic and interstitium between nodes -PDL1 80% TPS    07/07/2019 -  Radiation Therapy   status post adjuvant radiation.   07/23/2019 - 07/21/2021 Chemotherapy   Nivolumab q14d      06/29/2020 Imaging   CT chest abdomen pelvis showed stable postoperative appearance of the left groin.  No evidence of local recurrence.  No evidence of metastatic disease in the chest abdomen or pelvis.  Hepatic steatosis.  Stable subcentimeter fluid attenuation lesion of the lateral right lobe of the liver, likely benign cyst or hemangioma.  Coronary artery disease.  Aortic atherosclerosis   10/20/2020 Imaging   CT chest abdomen pelvis showed stable postoperative/radiation appearance of the left groin.  No evidence of local recurrence/metastatic disease within the chest abdomen/pelvis.  Fatty liver disease.  Diverticulosis  without evidence of typhlitis.  Aortic atherosclerosis   03/10/2021 Imaging   MRI brain without contrast showed no definitive evidence of intracranial metastatic disease.  Study is limited by absence of intravenous  contrast.     04/26/2021 Imaging   CT chest abdomen pelvis without contrast showed stable post operative changes of left groin with no evidence of recurrent disease.  No evidence of metastatic disease in the chest abdomen pelvis.  Aortic atherosclerosis   08/08/2021 - 08/16/2021 Hospital Admission    patient was hospitalized due to NSTEMI status post CABG x3.  He also had pacemaker The echocardiogram showed left ventricular ejection fraction of 65 to 70%,   09/15/2021 Imaging   CT chest abdomen pelvis w contrast  IMPRESSION: 1. Subtle hypodense 9 mm lesion in the left lobe of the liver is new from prior imaging including previous contrasted CT dating back to December 10, 2019, with the lesion appearing to equilibrate with background liver on delayed imaging sequence but is incompletely evaluated on this imaging study and technically nonspecific possibly reflecting a benign perfusional variant and while its appearance is not typical for that of a melanoma metastasis, it is not excluded on this examination. Suggest more definitive characterization by hepatic protocol MRI with and without contrast. 2. Stable postoperative changes in the left groin without evidence of local recurrent disease.3. No evidence of metastatic disease in the chest or pelvis.4.  Aortic Atherosclerosis (ICD10-I70.0).      09/23/2021 Imaging   Contrast-enhanced liver ultrasound Mildly hypoenhancing 2.2 cm mass in the posterior aspect of the left lobe of the liver with washout characteristics concerning for non hepatocellular malignancy, concerning for melanotic metastasis given history. The lesion is in an unfavorable location for percutaneous biopsy. Consider PET-CT for further characterization   10/21/2021 -  Chemotherapy   Nivolumab q14d      11/10/2021 Imaging   MRI Brain w wo contrast Negative for metastatic disease to the brain   11/10/2021 Imaging   MRI abdomen w wo contrast 1. Lesion of the posterior superior left  lobe of the liver, hepatic segment II, measuring 1.9 x 1.8 cm corresponding to findings of prior imaging. Evaluation is somewhat limited breath motion artifact however there is subtle associated rim enhancement of this lesion. Findings are most in keeping with a hepatic metastasis in the setting of known recurrent melanoma. 2. Mild hepatic steatosis. 3. Cardiomegaly.    02/27/2022 -  Chemotherapy   Patient is on Treatment Plan : Capecitabine (825 mg/m2 bid) + XRT     Cholangiocarcinoma ([place])  12/19/2021 Initial Diagnosis   Liver biopsy showed carcinoma  -The slides on the patient's prior left inguinal lymph node biopsy [id number]) with metastatic melanoma were reviewed in conjunction with  this case. The morphology of the malignant cells between the two cases are dissimilar. A limited panel of immunohistochemical stains was performed and the neoplastic cells are positive for superpancytokeratin, cytokeratin 7 (diffuse, strong), cytokeratin 20 (patchy, moderate) and negative for S100, SOX-10, and HepPar-1.These findings are consistent with carcinoma. The morphologic findings and pattern of immunohistochemical staining are non-specific and possible sites of origin include but are not limited to pancreaticobiliary tract, GI tract, prostate, kidney, lung, and breast. Per [facility], the patient had a PET scan performed in July 2023 which demonstrated a single hepatic  hypermetabolic lesion without other sites of disease.    01/19/2022 Surgery   Liver lesion resection at [facility] by Dr.Zani  A.  Liver, segment 2, 3, 4A, partial hepatectomy:   Cholangiocarcinoma,  moderately differentiated (2.5 cm, segment 2).  Tumor extends to hepatic parenchymal margin, but all other margins are negative for tumor. See synoptic report and comment.   Background liver with: Mild steatosis (20%).  No definitive evidence of steatohepatitis.   Mild periportal fibrosis (trichrome and reticulin). No stainable iron (Prussian blue  stain).  No evidence to support alpha-1-antitrypsin deficiency on PAS-D stain.   pT1a pNx   01/25/2022 Cancer Staging   Staging form: Intrahepatic Bile Duct, AJCC 8th Edition - Pathologic stage from 01/25/2022: Stage Unknown (pT1a, pNX, cM0) - Signed by [name], MD on 02/21/2022 Stage prefix: Initial diagnosis   03/23/2022 - 05/01/2022 Radiation Therapy   Concurrent Xeloda '825mg'$ /m2 BID + Radiation.    05/08/2022 -  Chemotherapy   Xeloda [1000 mg/m2 BID for 14 of every 21 days], plan 4 months.    05/09/2022 Imaging   CT chest abdomen pelvis with contrast at [facility] showed Status post partial left hepatectomy without CT evidence of recurrent or metastatic disease in the chest, abdomen, pelvis.      04/18/2021, patient establish care with neurology Dr. Mickeal Skinner for intermittent altered cognition.  he was recommended to start Vimpat.    INTERVAL HISTORY Edward Pitts is a 62 y.o. male who has above history reviewed by me today presents for follow up visit for management of inguinal nodal recurrence of melanoma, resected T1a cholangiocarcinoma. 06/06/2022, cycle 1 Xeloda.  Xeloda was stopped on 05/19/22 due to developing rash on face as well as dorsum of hands. Patient was seen by symptom management NP and was treated with a course of steroids which he has finished.  Rash has improved. Denies any nausea vomiting diarrhea.  :Review of Systems  Constitutional:  Negative for appetite change, chills, fatigue, fever and unexpected weight change.  HENT:   Negative for hearing loss and voice change.   Eyes:  Negative for eye problems and icterus.  Respiratory:  Negative for chest tightness, cough and shortness of breath.   Cardiovascular:  Positive for leg swelling. Negative for chest pain.  Gastrointestinal:  Negative for abdominal distention and abdominal pain.  Endocrine: Negative for hot flashes.  Genitourinary:  Negative for difficulty urinating, dysuria and frequency.   Musculoskeletal:         Status post left hip replacement  Skin:  Positive for rash. Negative for itching.       Skin hypo-pigmentation on upper extremities, no change  Neurological:  Negative for light-headedness and numbness.       Spells  Hematological:  Negative for adenopathy. Does not bruise/bleed easily.  Psychiatric/Behavioral:  Negative for confusion.     MEDICAL HISTORY:  Past Medical History:  Diagnosis Date   Anemia    iron treatments   Anxiety    Aortic atherosclerosis (HCC)    Arthritis    Cancer of groin ([place]) 2021   left groin, resected, radiation   Cataract    Complication of anesthesia    PONV   Coronary artery disease    Dizziness of unknown etiology    has led to seizures and passing out.   Family history of adverse reaction to anesthesia    PONV mother   GERD (gastroesophageal reflux disease)    History of complete heart block    PPM placed   Hyperlipidemia    Hypertension    LBBB (left bundle branch block)    Lymphedema of left leg    uses thigh high compression stockings   Melanoma ([place]) 2012   skin cancer,  left thigh   OSA on CPAP    PONV (postoperative nausea and vomiting) 04/16/2019   Port-A-Cath in place    RIGHT chest wall   Presence of cardiac pacemaker    [employer]   Seizures (HCC)    still has episodes of dizziness. last event 1 month ago (march 2022) and will pass out. takes clonazepam    SURGICAL HISTORY: Past Surgical History:  Procedure Laterality Date   CORONARY ARTERY BYPASS GRAFT N/A 08/10/2021   Procedure: CORONARY ARTERY BYPASS GRAFTING (CABG) X 3 USING LEFT INTERNAL MAMMARY ARTERY AND RIGHT GREATER SAPHENOUS VEIN;  Surgeon: Dahlia Byes, MD;  Location: [place];  Service: Open Heart Surgery;  Laterality: N/A;   CT RADIATION THERAPY GUIDE     left groin   dental implant     permanent implant   ENDOVEIN HARVEST OF GREATER SAPHENOUS VEIN Right 08/10/2021   Procedure: ENDOVEIN HARVEST OF GREATER SAPHENOUS VEIN;  Surgeon: Dahlia Byes, MD;   Location: [place];  Service: Open Heart Surgery;  Laterality: Right;   KNEE SURGERY Left    arthroscopy   LEFT HEART CATH AND CORONARY ANGIOGRAPHY Left 06/29/2017   Procedure: LEFT HEART CATH AND CORONARY ANGIOGRAPHY;  Surgeon: [name], MD;  Location: [place] CV [facility];  Service: Cardiovascular;  Laterality: Left;   LEFT HEART CATH AND CORONARY ANGIOGRAPHY N/A 08/08/2021   Procedure: LEFT HEART CATH AND CORONARY ANGIOGRAPHY;  Surgeon: [name], MD;  Location: [place] CV LAB;  Service: Cardiovascular;  Laterality: N/A;   LYMPH NODE DISSECTION Left 04/16/2019   Procedure: Left inguinal Lymph Node Dissection;  Surgeon: [name], MD;  Location: [place];  Service: General;  Laterality: Left;   MELANOMA EXCISION Left 04/16/2019   Procedure: MELANOMA EXCISION LEFT GROIN MASS;  Surgeon: [name], MD;  Location: [place];  Service: General;  Laterality: Left;   MELANOMA EXCISION WITH SENTINEL LYMPH NODE BIOPSY Left 2012   Left calf    PACEMAKER INSERTION N/A 08/26/2018   Procedure: INSERTION PACEMAKER;  Surgeon: [name], MD;  Location: [facility];  Service: Cardiovascular;  Laterality: N/A;   PORTA CATH INSERTION N/A 08/26/2019   Procedure: PORTA CATH INSERTION;  Surgeon: Katha Cabal, MD;  Location: [place] [facility];  Service: Cardiovascular;  Laterality: N/A;   SUPERFICIAL LYMPH NODE BIOPSY / EXCISION Left 2020   lymph nodes removed around left groin melanoma site   TEE WITHOUT CARDIOVERSION N/A 08/10/2021   Procedure: TRANSESOPHAGEAL ECHOCARDIOGRAM (TEE);  Surgeon: Dahlia Byes, MD;  Location: [place];  Service: Open Heart Surgery;  Laterality: N/A;   TEMPORARY PACEMAKER N/A 08/25/2018   Procedure: TEMPORARY PACEMAKER;  Surgeon: [name], MD;  Location: [place] [facility];  Service: Cardiovascular;  Laterality: N/A;   TOTAL HIP ARTHROPLASTY Left 07/14/2020   Procedure: TOTAL HIP ARTHROPLASTY;  Surgeon: Dereck Leep, MD;  Location: [facility];   Service: Orthopedics;  Laterality: Left;    SOCIAL HISTORY: Social History   Socioeconomic History   Marital status: Married    Spouse name: Vicente Males    Number of children: 7   Years of education: 12   Highest education level: Not on file  Occupational History    Comment: disability  Tobacco Use   Smoking status: Never   Smokeless tobacco: Never  Vaping Use   Vaping Use: Never used  Substance and Sexual Activity   Alcohol use: No   Drug use: No   Sexual activity: Not Currently  Other Topics Concern  Not on file  Social History Narrative   Lives with  Wife,   Has 2 small dogs   Caffeine use: sodas (2 per day)      Out of work on disability.  Has a walk in shower. No stairs to climb   Oncology treatment ongoing. Uses port a cath for treatment.      pacemaker   Social Determinants of Health   Financial Resource Strain: Low Risk  (02/12/2019)   Overall Financial Resource Strain (CARDIA)    Difficulty of Paying Living Expenses: Not hard at all  Food Insecurity: Food Insecurity Present (02/21/2022)   Hunger Vital Sign    Worried About Running Out of Food in the Last Year: Sometimes true    Ran Out of Food in the Last Year: Often true  Transportation Needs: No Transportation Needs (02/21/2022)   PRAPARE - Hydrologist (Medical): No    Lack of Transportation (Non-Medical): No  Physical Activity: Unknown (02/12/2019)   Exercise Vital Sign    Days of Exercise per Week: 0 days    Minutes of Exercise per Session: Not on file  Stress: No Stress Concern Present (02/12/2019)   [place]    Feeling of Stress : Only a little  Social Connections: Unknown (02/12/2019)   Social Connection and Isolation Panel [NHANES]    Frequency of Communication with Friends and Family: More than three times a week    Frequency of Social Gatherings with Friends and Family: Not on file    Attends  Religious Services: Not on file    Active Member of Clubs or Organizations: Not on file    Attends Archivist Meetings: Not on file    Marital Status: Married  Intimate Partner Violence: Not At Risk (02/21/2022)   Humiliation, Afraid, Rape, and Kick questionnaire    Fear of Current or Ex-Partner: No    Emotionally Abused: No    Physically Abused: No    Sexually Abused: No    FAMILY HISTORY: Family History  Problem Relation Age of Onset   Cancer Paternal Grandmother     ALLERGIES:  is allergic to ibuprofen, levetiracetam, and nsaids.  MEDICATIONS:  Current Outpatient Medications  Medication Sig Dispense Refill   apixaban (ELIQUIS) 5 MG TABS tablet Take 5 mg by mouth 2 (two) times daily.     aspirin 81 MG EC tablet Take 81 mg by mouth daily.     atorvastatin (LIPITOR) 20 MG tablet Take 20 mg by mouth daily.     Carboxymeth-Glyc-Polysorb PF (REFRESH OPTIVE MEGA-3) 0.5-1-0.5 % SOLN Place 1 drop into both eyes daily as needed (dry eyes).     clonazePAM (KLONOPIN) 0.5 MG tablet TAKE 1 TABLET BY MOUTH IN THE MORNING AND 2 TABS BY MOUTH IN THE EVENING 90 tablet 0   febuxostat (ULORIC) 40 MG tablet Take 40 mg by mouth daily.     ferrous sulfate 325 (65 FE) MG EC tablet TAKE 1 TABLET BY MOUTH 2 TIMES DAILY WITH A MEAL. 180 tablet 1   hydrochlorothiazide (HYDRODIURIL) 25 MG tablet Take 1 tablet (25 mg total) by mouth daily. 30 tablet 0   hydrocortisone 2.5 % ointment Apply 1 application. topically daily as needed (itching).     Lacosamide 100 MG TABS TAKE 1 TABLET (100 MG TOTAL) BY MOUTH IN THE MORNING AND AT BEDTIME. 60 tablet 3   lidocaine-prilocaine (EMLA) cream Apply 1 application. topically as needed. Apply small  amount of cream to port site approx 1-2 hours prior to appointment. 30 g 11   lisinopril (ZESTRIL) 20 MG tablet Take 1 tablet by mouth daily.     metoprolol succinate (TOPROL-XL) 25 MG 24 hr tablet Take 25 mg by mouth daily.     Multiple Vitamin (MULTIVITAMIN WITH  MINERALS) TABS tablet Take 1 tablet by mouth daily. Centrum Silver     omeprazole (PRILOSEC) 20 MG capsule TAKE 1 CAPSULE BY MOUTH EVERY DAY 90 capsule 0   ondansetron (ZOFRAN) 4 MG tablet TAKE 1 TABLET BY MOUTH EVERY 8 HOURS AS NEEDED FOR NAUSEA AND VOMITING 90 tablet 1   traZODone (DESYREL) 50 MG tablet TAKE 1 TABLET BY MOUTH EVERY DAY AT BEDTIME AS NEEDED FOR SLEEP 90 tablet 1   Calcium Carb-Cholecalciferol (OYSTER SHELL CALCIUM W/D) 500-5 MG-MCG TABS TAKE 1 TABLET BY MOUTH EVERY DAY (Patient not taking: Reported on 05/29/2022) 30 tablet 0   capecitabine (XELODA) 150 MG tablet Take 2 tablets (300 mg total) by mouth 2 (two) times daily after a meal. Take for 14 days, then hold for 7 days. Repeat every 21 days. Take along with '500mg'$  tablets. (Patient not taking: Reported on 05/29/2022) 56 tablet 0   capecitabine (XELODA) 500 MG tablet Take 4 tablets (2,000 mg total) by mouth 2 (two) times daily after a meal. Take for 14 days, then hold for 7 days. Repeat every 21 days. Take along with '150mg'$  tablets. (Patient not taking: Reported on 05/29/2022) 112 tablet 0   predniSONE (STERAPRED UNI-PAK 21 TAB) 10 MG (21) TBPK tablet Take as directed with breakfast (Patient not taking: Reported on 05/29/2022) 1 each 0   No current facility-administered medications for this visit.   Facility-Administered Medications Ordered in Other Visits  Medication Dose Route Frequency Provider Last Rate Last Admin   heparin lock flush 100 UNIT/ML injection            heparin lock flush 100 UNIT/ML injection              PHYSICAL EXAMINATION: ECOG PERFORMANCE STATUS: 1 - Symptomatic but completely ambulatory Vitals:   05/29/22 1031  BP: 130/65  Pulse: 73  Resp: 18  Temp: (!) 97.5 F (36.4 C)   Filed Weights   05/29/22 1031  Weight: 250 lb 14.4 oz (113.8 kg)    Physical Exam Constitutional:      General: He is not in acute distress.    Comments: Patient ambulates independently  HENT:     Head: Normocephalic and  atraumatic.  Eyes:     General: No scleral icterus.    Pupils: Pupils are equal, round, and reactive to light.  Cardiovascular:     Rate and Rhythm: Normal rate and regular rhythm.     Heart sounds: Normal heart sounds.  Pulmonary:     Effort: Pulmonary effort is normal. No respiratory distress.     Breath sounds: No wheezing.  Abdominal:     General: Bowel sounds are normal. There is no distension.     Palpations: Abdomen is soft. There is no mass.     Tenderness: There is no abdominal tenderness.     Comments:    Musculoskeletal:        General: No deformity. Normal range of motion.     Cervical back: Normal range of motion and neck supple.     Comments: Left lower extremity edema-chronic   Skin:    General: Skin is warm and dry.  Neurological:     Mental  Status: He is alert and oriented to person, place, and time. Mental status is at baseline.     Cranial Nerves: No cranial nerve deficit.     Coordination: Coordination normal.  Psychiatric:        Mood and Affect: Mood normal.     LABORATORY DATA:  I have reviewed the data as listed    Latest Ref Rng & Units 05/29/2022    9:43 AM 05/16/2022    9:54 AM 05/03/2022    8:49 AM  CBC  WBC 4.0 - 10.5 K/uL 4.7  3.9  3.4   Hemoglobin 13.0 - 17.0 g/dL 11.0  10.8  11.3   Hematocrit 39.0 - 52.0 % 32.2  31.9  33.9   Platelets 150 - 400 K/uL 136  162  142       Latest Ref Rng & Units 05/29/2022    9:43 AM 05/16/2022    9:54 AM 05/03/2022    8:49 AM  CMP  Glucose 70 - 99 mg/dL 110  106  156   BUN 8 - 23 mg/dL 41  39  36   Creatinine 0.61 - 1.24 mg/dL 1.39  1.44  1.41   Sodium 135 - 145 mmol/L 135  136  136   Potassium 3.5 - 5.1 mmol/L 5.1  5.4  4.5   Chloride 98 - 111 mmol/L 111  109  107   CO2 22 - 32 mmol/L '18  20  21   '$ Calcium 8.9 - 10.3 mg/dL 8.8  9.1  8.9   Total Protein 6.5 - 8.1 g/dL 6.9  7.1  6.9   Total Bilirubin 0.3 - 1.2 mg/dL 0.6  0.5  0.5   Alkaline Phos 38 - 126 U/L 74  76  74   AST 15 - 41 U/L 32  32  36   ALT  0 - 44 U/L 40  38  32      RADIOGRAPHIC STUDIES: I have personally reviewed the radiological images as listed and agreed with the findings in the report. PCV ECHOCARDIOGRAM COMPLETE  Result Date: 05/16/2022 Images from the original result were not included. Reason for Visit  INDICATIONS:   The patient is Z00.00. Echocardiogram: An echocardiogram in (2-d) mode was performed and in Doppler mode with color flow velocity mapping was performed. ventricular septum thickness 1.23 cm, L ventricular posterior wall thickness (diastole) 1.22 cm, left atrium size 4.2 cm, aortic root diameter 3.6 cm, L ventricle diastolic dimension 0000000 cm, L ventricle systolic dimension [id number], L ventricle ejection fraction 55.6 %, and LV fractional shortening 28.8 % L ventricular outflow tract internal diameter 3.2 cm, L ventricular outflow tract flow velocity 1.38 m/s, aortic valve cusps 1.7 cm , aortic valve flow velocity 1.71 (m/sec), aortic valve systolic calculated mean flow gradient 8 mmHg, mitral valve diastolic peak flow velocity E 1.24 m/sec, and mitral valve diastolic peak flow E/A ratio 3.2 % Mitral valve has trace regurgitation Tricuspid valve has trace regurgitation ASSESSMENT Technically adequate study. Mild left ventricular hypertrophy with GRADE 2 (psuedonormalization)  diastolic dysfunction. Normal right ventricular systolic function. Normal right ventricular diastolic function. Normal left ventricular wall motion. Normal right ventricular wall motion. Trace tricuspid regurgitation. Mild pulmonary hypertension. Trace mitral regurgitation. No pericardial effusion. Mildly dilated Left atrium Mild LVH

## 2022-05-29 NOTE — Assessment & Plan Note (Signed)
Chemotherapy plan as listed above.  

## 2022-05-29 NOTE — Progress Notes (Signed)
Pt here for follow up. Pt currenlty holding xeloda due to rash

## 2022-05-29 NOTE — Assessment & Plan Note (Addendum)
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  S/p chemotherapy Xeloda '825mg'$ /m2 BID+ concurrent RT Currently on adjuvant Xeloda [1000 mg/m2 BID for 14 of every 21 days]  Labs are reviewed and discussed with patient.  Recommend patient to start cycle 2  Xeloda

## 2022-05-29 NOTE — Assessment & Plan Note (Signed)
Encourage oral hydration and avoid nephrotoxins.   

## 2022-05-29 NOTE — Assessment & Plan Note (Signed)
Bilateral facial and dorsum of hands, possible photosensitivity rash secondary to chemotherapy. Recommend patient apply sunscreen, avoid direct sun exposure.

## 2022-05-30 NOTE — Progress Notes (Signed)
Closing encounter for provider.

## 2022-05-31 ENCOUNTER — Other Ambulatory Visit (HOSPITAL_COMMUNITY): Payer: Self-pay

## 2022-06-02 ENCOUNTER — Other Ambulatory Visit (HOSPITAL_COMMUNITY): Payer: Self-pay

## 2022-06-05 ENCOUNTER — Encounter: Payer: Self-pay | Admitting: Radiation Oncology

## 2022-06-05 ENCOUNTER — Other Ambulatory Visit: Payer: Self-pay | Admitting: Oncology

## 2022-06-05 ENCOUNTER — Ambulatory Visit
Admission: RE | Admit: 2022-06-05 | Discharge: 2022-06-05 | Disposition: A | Discharge status: Home / Self Care | Payer: 59 | Source: Ambulatory Visit | Attending: Radiation Oncology | Admitting: Radiation Oncology

## 2022-06-05 ENCOUNTER — Other Ambulatory Visit (HOSPITAL_COMMUNITY): Payer: Self-pay

## 2022-06-05 ENCOUNTER — Other Ambulatory Visit: Payer: Self-pay

## 2022-06-05 VITALS — BP 110/61 | HR 68 | Temp 98.7°F | Resp 20 | Ht 67.0 in | Wt 251.0 lb

## 2022-06-05 DIAGNOSIS — Z923 Personal history of irradiation: Secondary | ICD-10-CM | POA: Diagnosis not present

## 2022-06-05 DIAGNOSIS — C221 Intrahepatic bile duct carcinoma: Secondary | ICD-10-CM | POA: Insufficient documentation

## 2022-06-05 DIAGNOSIS — R110 Nausea: Secondary | ICD-10-CM | POA: Insufficient documentation

## 2022-06-05 MED ORDER — CAPECITABINE 150 MG PO TABS
300.0000 mg | ORAL_TABLET | Freq: Two times a day (BID) | ORAL | 0 refills | Status: DC
  Filled 2022-06-05: qty 56, 21d supply, fill #0

## 2022-06-05 MED ORDER — CAPECITABINE 500 MG PO TABS
2000.0000 mg | ORAL_TABLET | Freq: Two times a day (BID) | ORAL | 0 refills | Status: DC
  Filled 2022-06-05: qty 112, 21d supply, fill #0

## 2022-06-05 NOTE — Progress Notes (Signed)
Radiation Oncology Follow up Note  Name: Edward Pitts   Date:   06/05/2022 MRN:  [id number] DOB: 12/19/1960    This 62 y.o. male presents to the clinic today for 1 month follow-up status post adjuvant radiation therapy to his liver bed for stage I cholangiocarcinoma status post resection and patient previously treated to his left groin route for resected large area of metastatic malignant melanoma.  REFERRING PROVIDER: Mechele Claude, FNP  HPI: Is a 62 year old male now out 1 month having completed adjuvant radiation therapy status post surgical resection of a stage I cholangiocarcinoma.  Seen today in routine follow-up he is doing well specifically denies any abdominal pain diarrhea.  Does occasionally have some nausea which he associates with his current regimen of adjuvant Xeloda.  He does occasionally have some tenderness in his groin.  He recently had a CT scan of chest abdomen pelvis at [facility] showing partial left hepatectomy without evidence of recurrent or metastatic disease in the chest abdomen or pelvis.  COMPLICATIONS OF TREATMENT: none  FOLLOW UP COMPLIANCE: keeps appointments   PHYSICAL EXAM:  BP 110/61 (BP Location: Left Arm, Patient Position: Sitting, Cuff Size: Normal)   Pulse 68   Temp 98.7 F (37.1 C) (Tympanic)   Resp 20   Ht '5\' 7"'$  (1.702 m) Comment: Stated Ht  Wt 251 lb (113.9 kg)   BMI 39.31 kg/m  Well-developed well-nourished patient in NAD. HEENT reveals PERLA, EOMI, discs not visualized.  Oral cavity is clear. No oral mucosal lesions are identified. Neck is clear without evidence of cervical or supraclavicular adenopathy. Lungs are clear to A&P. Cardiac examination is essentially unremarkable with regular rate and rhythm without murmur rub or thrill. Abdomen is benign with no organomegaly or masses noted. Motor sensory and DTR levels are equal and symmetric in the upper and lower extremities. Cranial nerves II through XII are grossly intact. Proprioception is  intact. No peripheral adenopathy or edema is identified. No motor or sensory levels are noted. Crude visual fields are within normal range.  RADIOLOGY RESULTS: CT scan report reviewed  PLAN: Present and he is doing well very low side effect profile from adjuvant radiation therapy to both his groin and his liver.  On pleased with his overall progress.  Of asked to see him back in 5 months.  Will follow many copies of imaging studies he has done prior to that visit.  Patient is to call with any concerns continues close follow-up care and treatment through medical oncology.  I would like to take this opportunity to thank you for allowing me to participate in the care of your patient.Noreene Filbert, MD

## 2022-06-07 ENCOUNTER — Other Ambulatory Visit: Payer: Self-pay

## 2022-06-07 ENCOUNTER — Inpatient Hospital Stay (HOSPITAL_BASED_OUTPATIENT_CLINIC_OR_DEPARTMENT_OTHER): Payer: 59 | Admitting: Oncology

## 2022-06-07 ENCOUNTER — Emergency Department
Admission: EM | Admit: 2022-06-07 | Discharge: 2022-06-07 | Disposition: A | Discharge status: Home / Self Care | Payer: 59 | Attending: Emergency Medicine | Admitting: Emergency Medicine

## 2022-06-07 ENCOUNTER — Inpatient Hospital Stay: Payer: 59

## 2022-06-07 ENCOUNTER — Encounter: Payer: Self-pay | Admitting: Oncology

## 2022-06-07 VITALS — BP 138/51 | HR 82 | Temp 96.1°F | Wt 253.3 lb

## 2022-06-07 DIAGNOSIS — N1831 Chronic kidney disease, stage 3a: Secondary | ICD-10-CM

## 2022-06-07 DIAGNOSIS — Z951 Presence of aortocoronary bypass graft: Secondary | ICD-10-CM | POA: Diagnosis not present

## 2022-06-07 DIAGNOSIS — Z96642 Presence of left artificial hip joint: Secondary | ICD-10-CM | POA: Diagnosis not present

## 2022-06-07 DIAGNOSIS — D631 Anemia in chronic kidney disease: Secondary | ICD-10-CM | POA: Diagnosis not present

## 2022-06-07 DIAGNOSIS — I252 Old myocardial infarction: Secondary | ICD-10-CM | POA: Diagnosis not present

## 2022-06-07 DIAGNOSIS — N1832 Chronic kidney disease, stage 3b: Secondary | ICD-10-CM | POA: Diagnosis not present

## 2022-06-07 DIAGNOSIS — Z95828 Presence of other vascular implants and grafts: Secondary | ICD-10-CM

## 2022-06-07 DIAGNOSIS — L304 Erythema intertrigo: Secondary | ICD-10-CM | POA: Diagnosis not present

## 2022-06-07 DIAGNOSIS — C438 Malignant melanoma of overlapping sites of skin: Secondary | ICD-10-CM | POA: Diagnosis not present

## 2022-06-07 DIAGNOSIS — Z79899 Other long term (current) drug therapy: Secondary | ICD-10-CM | POA: Diagnosis not present

## 2022-06-07 DIAGNOSIS — Z85820 Personal history of malignant melanoma of skin: Secondary | ICD-10-CM | POA: Diagnosis not present

## 2022-06-07 DIAGNOSIS — I129 Hypertensive chronic kidney disease with stage 1 through stage 4 chronic kidney disease, or unspecified chronic kidney disease: Secondary | ICD-10-CM | POA: Diagnosis not present

## 2022-06-07 DIAGNOSIS — C221 Intrahepatic bile duct carcinoma: Secondary | ICD-10-CM | POA: Diagnosis not present

## 2022-06-07 DIAGNOSIS — Z950 Presence of cardiac pacemaker: Secondary | ICD-10-CM | POA: Insufficient documentation

## 2022-06-07 DIAGNOSIS — E785 Hyperlipidemia, unspecified: Secondary | ICD-10-CM | POA: Diagnosis not present

## 2022-06-07 DIAGNOSIS — R21 Rash and other nonspecific skin eruption: Secondary | ICD-10-CM

## 2022-06-07 DIAGNOSIS — I2510 Atherosclerotic heart disease of native coronary artery without angina pectoris: Secondary | ICD-10-CM | POA: Insufficient documentation

## 2022-06-07 DIAGNOSIS — Z452 Encounter for adjustment and management of vascular access device: Secondary | ICD-10-CM | POA: Diagnosis not present

## 2022-06-07 DIAGNOSIS — L03314 Cellulitis of groin: Secondary | ICD-10-CM

## 2022-06-07 DIAGNOSIS — B3749 Other urogenital candidiasis: Secondary | ICD-10-CM | POA: Insufficient documentation

## 2022-06-07 DIAGNOSIS — K760 Fatty (change of) liver, not elsewhere classified: Secondary | ICD-10-CM | POA: Diagnosis not present

## 2022-06-07 DIAGNOSIS — R569 Unspecified convulsions: Secondary | ICD-10-CM | POA: Diagnosis not present

## 2022-06-07 LAB — CBC WITH DIFFERENTIAL (CANCER CENTER ONLY)
Abs Immature Granulocytes: 0.03 10*3/uL (ref 0.00–0.07)
Basophils Absolute: 0 10*3/uL (ref 0.0–0.1)
Basophils Relative: 0 %
Eosinophils Absolute: 0.1 10*3/uL (ref 0.0–0.5)
Eosinophils Relative: 2 %
HCT: 28.9 % — ABNORMAL LOW (ref 39.0–52.0)
Hemoglobin: 9.8 g/dL — ABNORMAL LOW (ref 13.0–17.0)
Immature Granulocytes: 1 %
Lymphocytes Relative: 11 %
Lymphs Abs: 0.3 10*3/uL — ABNORMAL LOW (ref 0.7–4.0)
MCH: 33.1 pg (ref 26.0–34.0)
MCHC: 33.9 g/dL (ref 30.0–36.0)
MCV: 97.6 fL (ref 80.0–100.0)
Monocytes Absolute: 0.2 10*3/uL (ref 0.1–1.0)
Monocytes Relative: 7 %
Neutro Abs: 2.6 10*3/uL (ref 1.7–7.7)
Neutrophils Relative %: 79 %
Platelet Count: 162 10*3/uL (ref 150–400)
RBC: 2.96 MIL/uL — ABNORMAL LOW (ref 4.22–5.81)
RDW: 14.7 % (ref 11.5–15.5)
WBC Count: 3.2 10*3/uL — ABNORMAL LOW (ref 4.0–10.5)
nRBC: 0 % (ref 0.0–0.2)

## 2022-06-07 LAB — CMP (CANCER CENTER ONLY)
ALT: 55 U/L — ABNORMAL HIGH (ref 0–44)
AST: 35 U/L (ref 15–41)
Albumin: 3.9 g/dL (ref 3.5–5.0)
Alkaline Phosphatase: 79 U/L (ref 38–126)
Anion gap: 5 (ref 5–15)
BUN: 40 mg/dL — ABNORMAL HIGH (ref 8–23)
CO2: 20 mmol/L — ABNORMAL LOW (ref 22–32)
Calcium: 9 mg/dL (ref 8.9–10.3)
Chloride: 110 mmol/L (ref 98–111)
Creatinine: 1.48 mg/dL — ABNORMAL HIGH (ref 0.61–1.24)
GFR, Estimated: 53 mL/min — ABNORMAL LOW (ref >60)
Glucose, Bld: 171 mg/dL — ABNORMAL HIGH (ref 70–99)
Potassium: 4.6 mmol/L (ref 3.5–5.1)
Sodium: 135 mmol/L (ref 135–145)
Total Bilirubin: 0.5 mg/dL (ref 0.3–1.2)
Total Protein: 6.9 g/dL (ref 6.5–8.1)

## 2022-06-07 LAB — IRON AND TIBC
Iron: 116 ug/dL (ref 45–182)
Saturation Ratios: 31 % (ref 17.9–39.5)
TIBC: 379 ug/dL (ref 250–450)
UIBC: 263 ug/dL

## 2022-06-07 LAB — FERRITIN: Ferritin: 180 ng/mL (ref 24–336)

## 2022-06-07 MED ORDER — PREDNISONE 10 MG (21) PO TBPK: ORAL_TABLET | ORAL | 0 refills | Status: DC

## 2022-06-07 MED ORDER — CLOTRIMAZOLE 1 % EX CREA: 1.0000 | TOPICAL_CREAM | Freq: Two times a day (BID) | CUTANEOUS | 0 refills | Status: AC

## 2022-06-07 MED ORDER — ZINC OXIDE 20 % EX OINT: 1.0000 | TOPICAL_OINTMENT | CUTANEOUS | 0 refills | Status: DC | PRN

## 2022-06-07 MED ORDER — HEPARIN SOD (PORK) LOCK FLUSH 100 UNIT/ML IV SOLN
500.0000 [IU] | Freq: Once | INTRAVENOUS | Status: AC
  Administered 2022-06-07: 500 [IU] via INTRAVENOUS
  Filled 2022-06-07: qty 5

## 2022-06-07 MED ORDER — CEPHALEXIN 500 MG PO CAPS: 500.0000 mg | ORAL_CAPSULE | Freq: Four times a day (QID) | ORAL | 0 refills | Status: AC

## 2022-06-07 MED ORDER — SODIUM CHLORIDE 0.9% FLUSH
10.0000 mL | Freq: Once | INTRAVENOUS | Status: AC
  Administered 2022-06-07: 10 mL via INTRAVENOUS
  Filled 2022-06-07: qty 10

## 2022-06-07 MED ORDER — CEPHALEXIN 500 MG PO CAPS
500.0000 mg | ORAL_CAPSULE | Freq: Once | ORAL | Status: AC
  Administered 2022-06-07: 500 mg via ORAL
  Filled 2022-06-07: qty 1

## 2022-06-07 NOTE — Assessment & Plan Note (Signed)
Finish course of Keflex, continue topical clotrimazole

## 2022-06-07 NOTE — Assessment & Plan Note (Addendum)
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  S/p chemotherapy Xeloda '825mg'$ /m2 BID+ concurrent RT Currently on adjuvant Xeloda [1000 mg/m2 BID for 14 of every 21 days]  Labs are reviewed and discussed with patient.  Recommend patient to hold off Xeloda due to left groin yeast/bacterial infection

## 2022-06-07 NOTE — Assessment & Plan Note (Signed)
Bilateral facial and dorsum of hands,  Recommend patient to take a course of prednisone.  Consider dose decrease of capecitabine for future cycles.

## 2022-06-07 NOTE — Assessment & Plan Note (Signed)
Hemoglobin is stable. Continue to monitor 

## 2022-06-07 NOTE — Assessment & Plan Note (Signed)
Encourage oral hydration and avoid nephrotoxins.   

## 2022-06-07 NOTE — Discharge Instructions (Signed)
I suspect that you have a fungal infection in the groin.  Please use the topical clotrimazole on the area twice a day for the next 7 days.  Please also take the antibiotic in case you are developing a bacterial infection.  Please keep the area clean and dry as best as you can.  You can also use zinc oxide to protect the area from irritation.

## 2022-06-07 NOTE — ED Triage Notes (Addendum)
Pt to ED via POV c/o bleeding to incision in groin area. Pt states he had surgery in 2021 and is having bleeding from healed incision site from clothes rubbing against it. Not bleeding at this time. Reports taking eliquis Denies any other symptoms. Hx of liver and skin cancer, pt under active chemo.

## 2022-06-07 NOTE — Progress Notes (Signed)
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  S/p chemotherapy Xeloda '825mg'$ /m2 BID+ concurrent RT Currently on adjuvant Xeloda [1000 mg/m2 BID for 14 of every 21 days]  Labs are reviewed and discussed with patient.  Recommend patient to hold off Xeloda due to left groin yeast/bacterial infection   Skin rash Bilateral facial and dorsum of hands,  Recommend patient to take a course of prednisone.  Consider dose decrease of capecitabine for future cycles.   Stage 3a chronic kidney disease ([place]) Encourage oral hydration and avoid nephrotoxins.     Malignant melanoma of overlapping sites [facility]) Patient has no radiographic evidence of metastatic melanoma. Off immunotherapy. Continue surveillance  Cellulitis of groin, left Finish course of Keflex, continue topical clotrimazole  Anemia in chronic kidney disease Hemoglobin is stable.  Continue to monitor    Follow-up 7 to 10 days All questions were answered. The patient knows to call the clinic with any problems, questions or concerns.  [name], MD, PhD [facility] Hematology Oncology 06/07/2022    HISTORY OF PRESENTING ILLNESS:   Edward Pitts is a  62 y.o.  male presents for recurrent malignant melanoma.   Oncology History  Malignant melanoma of overlapping sites [facility])   04/16/2019 Cancer Staging   Staging form: Melanoma of the Skin, AJCC 8th Edition - Pathologic: Stage Unknown (rpTX, pN1b, cM0) - Signed by [name], MD on 07/27/2020 Stage prefix: Recurrence    04/23/2019 Initial Diagnosis   Malignant melanoma   -He has a history of left lower extremity melanoma in 2011, status post local excision -04/16/2019 patient underwent left groin mass resection  Resection pathology showed malignant melanoma, replacing a lymph node, with extracapsular extension, peripheral and deep margins involved.  Left inguinal contents, all 7 lymph nodes were negative for melanoma in the lymph nodes. Extranodal melanoma identified in lymphatic and interstitium between nodes -PDL1 80% TPS    07/07/2019 -  Radiation Therapy   status post adjuvant radiation.   07/23/2019 - 07/21/2021 Chemotherapy   Nivolumab q14d      06/29/2020 Imaging   CT chest abdomen pelvis showed stable postoperative appearance of the left groin.  No evidence of local recurrence.  No evidence of metastatic disease in the chest abdomen or pelvis.  Hepatic steatosis.  Stable subcentimeter fluid attenuation lesion of the lateral right lobe of the liver, likely benign cyst or hemangioma.  Coronary artery disease.  Aortic atherosclerosis   10/20/2020 Imaging   CT chest abdomen pelvis showed stable postoperative/radiation appearance of the left groin.  No evidence of local recurrence/metastatic disease within the chest abdomen/pelvis.  Fatty liver disease.  Diverticulosis without evidence of typhlitis.  Aortic atherosclerosis   03/10/2021 Imaging   MRI brain without contrast showed no definitive evidence of intracranial metastatic disease.  Study is limited by absence of intravenous contrast.     04/26/2021 Imaging   CT chest abdomen pelvis without contrast showed stable  post operative changes of left groin with no evidence of recurrent disease.  No evidence of metastatic disease in the chest abdomen pelvis.  Aortic  atherosclerosis   08/08/2021 - 08/16/2021 Hospital Admission    patient was hospitalized due to NSTEMI status post CABG x3.  He also had pacemaker The echocardiogram showed left ventricular ejection fraction of 65 to 70%,   09/15/2021 Imaging   CT chest abdomen pelvis w contrast  IMPRESSION: 1. Subtle hypodense 9 mm lesion in the left lobe of the liver is new from prior imaging including previous contrasted CT dating back to December 10, 2019, with the lesion appearing to equilibrate with background liver on delayed imaging sequence but is incompletely evaluated on this imaging study and technically nonspecific possibly reflecting a benign perfusional variant and while its appearance is not typical for that of a melanoma metastasis, it is not excluded on this examination. Suggest more definitive characterization by hepatic protocol MRI with and without contrast. 2. Stable postoperative changes in the left groin without evidence of local recurrent disease.3. No evidence of metastatic disease in the chest or pelvis.4.  Aortic Atherosclerosis (ICD10-I70.0).      09/23/2021 Imaging   Contrast-enhanced liver ultrasound Mildly hypoenhancing 2.2 cm mass in the posterior aspect of the left lobe of the liver with washout characteristics concerning for non hepatocellular malignancy, concerning for melanotic metastasis given history. The lesion is in an unfavorable location for percutaneous biopsy. Consider PET-CT for further characterization   10/21/2021 -  Chemotherapy   Nivolumab q14d      11/10/2021 Imaging   MRI Brain w wo contrast Negative for metastatic disease to the brain   11/10/2021 Imaging   MRI abdomen w wo contrast 1. Lesion of the posterior superior left lobe of the liver, hepatic segment II, measuring 1.9 x 1.8 cm corresponding to findings of prior imaging. Evaluation is somewhat limited breath motion artifact however there is subtle associated rim enhancement of this lesion. Findings are  most in keeping with a hepatic metastasis in the setting of known recurrent melanoma. 2. Mild hepatic steatosis. 3. Cardiomegaly.    02/27/2022 -  Chemotherapy   Patient is on Treatment Plan : Capecitabine (825 mg/m2 bid) + XRT     Cholangiocarcinoma ([place])  12/19/2021 Initial Diagnosis   Liver biopsy showed carcinoma  -The slides on the patient's prior left inguinal lymph node biopsy [id number]) with metastatic melanoma were reviewed in conjunction with  this case. The morphology of the malignant cells between the two cases are dissimilar. A limited panel of immunohistochemical stains was performed and the neoplastic cells are positive for superpancytokeratin, cytokeratin 7 (diffuse, strong), cytokeratin 20 (patchy, moderate) and negative for S100, SOX-10, and HepPar-1.These findings are consistent with carcinoma. The morphologic findings and pattern of immunohistochemical staining are non-specific and possible sites of origin include but are not limited to pancreaticobiliary tract, GI tract, prostate, kidney, lung, and breast. Per [facility], the patient had a PET scan performed in July 2023 which demonstrated a single hepatic  hypermetabolic lesion without other sites of disease.    01/19/2022 Surgery   Liver lesion resection at [facility] by Dr.Zani  A.  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  Tumor extends to hepatic parenchymal margin, but all other margins are negative for tumor. See synoptic report and comment.   Background liver with: Mild steatosis (20%).  No definitive evidence of steatohepatitis.   Mild periportal fibrosis (trichrome and reticulin). No stainable  iron (Prussian blue stain).  No evidence to support alpha-1-antitrypsin deficiency on PAS-D stain.   pT1a pNx   01/25/2022 Cancer Staging   Staging form: Intrahepatic Bile Duct, AJCC 8th Edition - Pathologic stage from 01/25/2022: Stage Unknown (pT1a, pNX,  cM0) - Signed by [name], MD on 02/21/2022 Stage prefix: Initial diagnosis   03/23/2022 - 05/01/2022 Radiation Therapy   Concurrent Xeloda '825mg'$ /m2 BID + Radiation.    05/08/2022 -  Chemotherapy   Xeloda [1000 mg/m2 BID for 14 of every 21 days], plan 4 months.   05/08/2022, cycle 1 Xeloda.  Xeloda was stopped on 05/19/22 due to developing rash on face as well as dorsum of hands. Treated with steroid,symptoms resolved.  3/4-3/13/2024  Xeloda was stopped early due to groin cellulitis, he also developed similar skin rash again    05/09/2022 Imaging   CT chest abdomen pelvis with contrast at [facility] showed Status post partial left hepatectomy without CT evidence of recurrent or metastatic disease in the chest, abdomen, pelvis.      04/18/2021, patient establish care with neurology Dr. Mickeal Skinner for intermittent altered cognition.  he was recommended to start Vimpat.    INTERVAL HISTORY Nygil Luttrull is a 62 y.o. male who has above history reviewed by me today presents for follow up visit for management of inguinal nodal recurrence of melanoma, resected T1a cholangiocarcinoma. Patient developed skin lesion again on his hands, mild rash on his face.  No rash on torso.  During interval, he also went to emergency room due to groin bleeding.Marland Kitchen  He was found to have possible groin cellulitis or yeast infection.  Patient was prescribed.  With Keflex and also topical clotrimazole. Patient denies any nausea vomiting diarrhea, fever or chills.  :Review of Systems  Constitutional:  Negative for appetite change, chills, fatigue, fever and unexpected weight change.  HENT:   Negative for hearing loss and voice change.   Eyes:  Negative for eye problems and icterus.  Respiratory:  Negative for chest tightness, cough and shortness of breath.   Cardiovascular:  Positive for leg swelling. Negative for chest pain.  Gastrointestinal:  Negative for abdominal distention and abdominal pain.  Endocrine: Negative for hot  flashes.  Genitourinary:  Negative for difficulty urinating, dysuria and frequency.   Musculoskeletal:        Status post left hip replacement  Skin:  Positive for rash. Negative for itching.       Skin hypo-pigmentation on upper extremities, no change Rash on his hands and groin.  Neurological:  Negative for light-headedness and numbness.       Spells  Hematological:  Negative for adenopathy. Does not bruise/bleed easily.  Psychiatric/Behavioral:  Negative for confusion.     MEDICAL HISTORY:  Past Medical History:  Diagnosis Date   Anemia    iron treatments   Anxiety    Aortic atherosclerosis ([facility])    Arthritis    Cancer of groin ([place]) 2021   left groin, resected, radiation   Cataract    Complication of anesthesia    PONV   Coronary artery disease    Dizziness of unknown etiology    has led to seizures and passing out.   Family history of adverse reaction to anesthesia    PONV mother   GERD (gastroesophageal reflux disease)    History of complete heart block    PPM placed   Hyperlipidemia    Hypertension    LBBB (left bundle branch block)    Lymphedema of left leg  uses thigh high compression stockings   Melanoma ([place]) 2012   skin cancer, left thigh   OSA on CPAP    PONV (postoperative nausea and vomiting) 04/16/2019   Port-A-Cath in place    RIGHT chest wall   Presence of cardiac pacemaker    [employer]   Seizures ([place])    still has episodes of dizziness. last event 1 month ago (march 2022) and will pass out. takes clonazepam    SURGICAL HISTORY: Past Surgical History:  Procedure Laterality Date   CORONARY ARTERY BYPASS GRAFT N/A 08/10/2021   Procedure: CORONARY ARTERY BYPASS GRAFTING (CABG) X 3 USING LEFT INTERNAL MAMMARY ARTERY AND RIGHT GREATER SAPHENOUS VEIN;  Surgeon: Dahlia Byes, MD;  Location: [place];  Service: Open Heart Surgery;  Laterality: N/A;   CT RADIATION THERAPY GUIDE     left groin   dental implant     permanent implant   ENDOVEIN  HARVEST OF GREATER SAPHENOUS VEIN Right 08/10/2021   Procedure: ENDOVEIN HARVEST OF GREATER SAPHENOUS VEIN;  Surgeon: Dahlia Byes, MD;  Location: [place];  Service: Open Heart Surgery;  Laterality: Right;   KNEE SURGERY Left    arthroscopy   LEFT HEART CATH AND CORONARY ANGIOGRAPHY Left 06/29/2017   Procedure: LEFT HEART CATH AND CORONARY ANGIOGRAPHY;  Surgeon: [name], MD;  Location: [place] CV LAB;  Service: Cardiovascular;  Laterality: Left;   LEFT HEART CATH AND CORONARY ANGIOGRAPHY N/A 08/08/2021   Procedure: LEFT HEART CATH AND CORONARY ANGIOGRAPHY;  Surgeon: [name], MD;  Location: [place] CV [facility];  Service: Cardiovascular;  Laterality: N/A;   LYMPH NODE DISSECTION Left 04/16/2019   Procedure: Left inguinal Lymph Node Dissection;  Surgeon: [name], MD;  Location: [place];  Service: General;  Laterality: Left;   MELANOMA EXCISION Left 04/16/2019   Procedure: MELANOMA EXCISION LEFT GROIN MASS;  Surgeon: [name], MD;  Location: [place];  Service: General;  Laterality: Left;   MELANOMA EXCISION WITH SENTINEL LYMPH NODE BIOPSY Left 2012   Left calf    PACEMAKER INSERTION N/A 08/26/2018   Procedure: INSERTION PACEMAKER;  Surgeon: [name], MD;  Location: [facility];  Service: Cardiovascular;  Laterality: N/A;   PORTA CATH INSERTION N/A 08/26/2019   Procedure: PORTA CATH INSERTION;  Surgeon: Katha Cabal, MD;  Location: [place] CV [facility];  Service: Cardiovascular;  Laterality: N/A;   SUPERFICIAL LYMPH NODE BIOPSY / EXCISION Left 2020   lymph nodes removed around left groin melanoma site   TEE WITHOUT CARDIOVERSION N/A 08/10/2021   Procedure: TRANSESOPHAGEAL ECHOCARDIOGRAM (TEE);  Surgeon: Dahlia Byes, MD;  Location: [place];  Service: Open Heart Surgery;  Laterality: N/A;   TEMPORARY PACEMAKER N/A 08/25/2018   Procedure: TEMPORARY PACEMAKER;  Surgeon: [name], MD;  Location: [place] [facility];  Service: Cardiovascular;  Laterality:  N/A;   TOTAL HIP ARTHROPLASTY Left 07/14/2020   Procedure: TOTAL HIP ARTHROPLASTY;  Surgeon: Dereck Leep, MD;  Location: [facility];  Service: Orthopedics;  Laterality: Left;    SOCIAL HISTORY: Social History   Socioeconomic History   Marital status: Married    Spouse name: Vicente Males    Number of children: 7   Years of education: 12   Highest education level: Not on file  Occupational History    Comment: disability  Tobacco Use   Smoking status: Never   Smokeless tobacco: Never  Vaping Use   Vaping Use: Never used  Substance and Sexual Activity   Alcohol use: No  Drug use: No   Sexual activity: Not Currently  Other Topics Concern   Not on file  Social History Narrative   Lives with  Wife,   Has 2 small dogs   Caffeine use: sodas (2 per day)      Out of work on disability.  Has a walk in shower. No stairs to climb   Oncology treatment ongoing. Uses port a cath for treatment.      pacemaker   Social Determinants of Health   Financial Resource Strain: Low Risk  (02/12/2019)   Overall Financial Resource Strain (CARDIA)    Difficulty of Paying Living Expenses: Not hard at all  Food Insecurity: Food Insecurity Present (02/21/2022)   Hunger Vital Sign    Worried About Running Out of Food in the Last Year: Sometimes true    Ran Out of Food in the Last Year: Often true  Transportation Needs: No Transportation Needs (02/21/2022)   PRAPARE - Hydrologist (Medical): No    Lack of Transportation (Non-Medical): No  Physical Activity: Unknown (02/12/2019)   Exercise Vital Sign    Days of Exercise per Week: 0 days    Minutes of Exercise per Session: Not on file  Stress: No Stress Concern Present (02/12/2019)   [place]    Feeling of Stress : Only a little  Social Connections: Unknown (02/12/2019)   Social Connection and Isolation Panel [NHANES]    Frequency of Communication with  Friends and Family: More than three times a week    Frequency of Social Gatherings with Friends and Family: Not on file    Attends Religious Services: Not on file    Active Member of Clubs or Organizations: Not on file    Attends Archivist Meetings: Not on file    Marital Status: Married  Intimate Partner Violence: Not At Risk (02/21/2022)   Humiliation, Afraid, Rape, and Kick questionnaire    Fear of Current or Ex-Partner: No    Emotionally Abused: No    Physically Abused: No    Sexually Abused: No    FAMILY HISTORY: Family History  Problem Relation Age of Onset   Cancer Paternal Grandmother     ALLERGIES:  is allergic to ibuprofen, levetiracetam, and nsaids.  MEDICATIONS:  Current Outpatient Medications  Medication Sig Dispense Refill   apixaban (ELIQUIS) 5 MG TABS tablet Take 5 mg by mouth 2 (two) times daily.     aspirin 81 MG EC tablet Take 81 mg by mouth daily.     atorvastatin (LIPITOR) 20 MG tablet Take 20 mg by mouth daily.     capecitabine (XELODA) 150 MG tablet Take 2 tablets (300 mg total) by mouth 2 (two) times daily after a meal. Take for 14 days, then hold for 7 days. Repeat every 21 days. Take along with '500mg'$  tablets. 56 tablet 0   capecitabine (XELODA) 500 MG tablet Take 4 tablets (2,000 mg total) by mouth 2 (two) times daily after a meal. Take for 14 days, then hold for 7 days. Repeat every 21 days. Take along with '150mg'$  tablets. 112 tablet 0   Carboxymeth-Glyc-Polysorb PF (REFRESH OPTIVE MEGA-3) 0.5-1-0.5 % SOLN Place 1 drop into both eyes daily as needed (dry eyes).     cephALEXin (KEFLEX) 500 MG capsule Take 1 capsule (500 mg total) by mouth 4 (four) times daily for 5 days. 20 capsule 0   clonazePAM (KLONOPIN) 0.5 MG tablet TAKE 1 TABLET  BY MOUTH IN THE MORNING AND 2 TABS BY MOUTH IN THE EVENING 90 tablet 0   clotrimazole (CLOTRIMAZOLE ANTI-FUNGAL) 1 % cream Apply 1 Application topically 2 (two) times daily. Apply to the L inguinal fold 30 g 0    febuxostat (ULORIC) 40 MG tablet Take 40 mg by mouth daily.     ferrous sulfate 325 (65 FE) MG EC tablet TAKE 1 TABLET BY MOUTH 2 TIMES DAILY WITH A MEAL. 180 tablet 1   hydrocortisone 2.5 % ointment Apply 1 application. topically daily as needed (itching).     Lacosamide 100 MG TABS TAKE 1 TABLET (100 MG TOTAL) BY MOUTH IN THE MORNING AND AT BEDTIME. 60 tablet 3   lidocaine-prilocaine (EMLA) cream Apply 1 application. topically as needed. Apply small amount of cream to port site approx 1-2 hours prior to appointment. 30 g 11   lisinopril (ZESTRIL) 20 MG tablet Take 1 tablet by mouth daily.     metoprolol succinate (TOPROL-XL) 25 MG 24 hr tablet Take 25 mg by mouth daily.     Multiple Vitamin (MULTIVITAMIN WITH MINERALS) TABS tablet Take 1 tablet by mouth daily. Centrum Silver     omeprazole (PRILOSEC) 20 MG capsule TAKE 1 CAPSULE BY MOUTH EVERY DAY 90 capsule 0   ondansetron (ZOFRAN) 4 MG tablet TAKE 1 TABLET BY MOUTH EVERY 8 HOURS AS NEEDED FOR NAUSEA AND VOMITING 90 tablet 1   traZODone (DESYREL) 50 MG tablet TAKE 1 TABLET BY MOUTH EVERY DAY AT BEDTIME AS NEEDED FOR SLEEP 90 tablet 1   zinc oxide 20 % ointment Apply 1 Application topically as needed for irritation. 56.7 g 0   No current facility-administered medications for this visit.   Facility-Administered Medications Ordered in Other Visits  Medication Dose Route Frequency Provider Last Rate Last Admin   heparin lock flush 100 UNIT/ML injection            heparin lock flush 100 UNIT/ML injection            heparin lock flush 100 unit/mL  500 Units Intravenous Once [name], MD         PHYSICAL EXAMINATION: ECOG PERFORMANCE STATUS: 1 - Symptomatic but completely ambulatory Vitals:   06/07/22 0906  BP: (!) 138/51  Pulse: 82  Temp: (!) 96.1 F (35.6 C)  SpO2: 99%   Filed Weights   06/07/22 0906  Weight: 253 lb 4.8 oz (114.9 kg)    Physical Exam Constitutional:      General: He is not in acute distress.    Comments:  Patient ambulates independently  HENT:     Head: Normocephalic and atraumatic.  Eyes:     General: No scleral icterus.    Pupils: Pupils are equal, round, and reactive to light.  Cardiovascular:     Rate and Rhythm: Normal rate and regular rhythm.     Heart sounds: Normal heart sounds.  Pulmonary:     Effort: Pulmonary effort is normal. No respiratory distress.     Breath sounds: No wheezing.  Abdominal:     General: Bowel sounds are normal. There is no distension.     Palpations: Abdomen is soft. There is no mass.     Tenderness: There is no abdominal tenderness.     Comments:    Musculoskeletal:        General: No deformity. Normal range of motion.     Cervical back: Normal range of motion and neck supple.     Comments: Left lower extremity edema-chronic  Skin:    General: Skin is warm and dry.     Comments: Bilateral dorsum had raised erythematous rash Left groin chronic edema with erythematous changes.  Neurological:     Mental Status: He is alert and oriented to person, place, and time. Mental status is at baseline.     Cranial Nerves: No cranial nerve deficit.     Coordination: Coordination normal.  Psychiatric:        Mood and Affect: Mood normal.     LABORATORY DATA:  I have reviewed the data as listed    Latest Ref Rng & Units 06/07/2022    8:49 AM 05/29/2022    9:43 AM 05/16/2022    9:54 AM  CBC  WBC 4.0 - 10.5 K/uL 3.2  4.7  3.9   Hemoglobin 13.0 - 17.0 g/dL 9.8  11.0  10.8   Hematocrit 39.0 - 52.0 % 28.9  32.2  31.9   Platelets 150 - 400 K/uL 162  136  162       Latest Ref Rng & Units 05/29/2022    9:43 AM 05/16/2022    9:54 AM 05/03/2022    8:49 AM  CMP  Glucose 70 - 99 mg/dL 110  106  156   BUN 8 - 23 mg/dL 41  39  36   Creatinine 0.61 - 1.24 mg/dL 1.39  1.44  1.41   Sodium 135 - 145 mmol/L 135  136  136   Potassium 3.5 - 5.1 mmol/L 5.1  5.4  4.5   Chloride 98 - 111 mmol/L 111  109  107   CO2 22 - 32 mmol/L '18  20  21   '$ Calcium 8.9 - 10.3 mg/dL 8.8   9.1  8.9   Total Protein 6.5 - 8.1 g/dL 6.9  7.1  6.9   Total Bilirubin 0.3 - 1.2 mg/dL 0.6  0.5  0.5   Alkaline Phos 38 - 126 U/L 74  76  74   AST 15 - 41 U/L 32  32  36   ALT 0 - 44 U/L 40  38  32      RADIOGRAPHIC STUDIES: I have personally reviewed the radiological images as listed and agreed with the findings in the report. PCV ECHOCARDIOGRAM COMPLETE  Result Date: 05/16/2022 Images from the original result were not included. Reason for Visit  INDICATIONS:   The patient is Z00.00. Echocardiogram: An echocardiogram in (2-d) mode was performed and in Doppler mode with color flow velocity mapping was performed. ventricular septum thickness 1.23 cm, L ventricular posterior wall thickness (diastole) 1.22 cm, left atrium size 4.2 cm, aortic root diameter 3.6 cm, L ventricle diastolic dimension 0000000 cm, L ventricle systolic dimension [id number], L ventricle ejection fraction 55.6 %, and LV fractional shortening 28.8 % L ventricular outflow tract internal diameter 3.2 cm, L ventricular outflow tract flow velocity 1.38 m/s, aortic valve cusps 1.7 cm , aortic valve flow velocity 1.71 (m/sec), aortic valve systolic calculated mean flow gradient 8 mmHg, mitral valve diastolic peak flow velocity E 1.24 m/sec, and mitral valve diastolic peak flow E/A ratio 3.2 % Mitral valve has trace regurgitation Tricuspid valve has trace regurgitation ASSESSMENT Technically adequate study. Mild left ventricular hypertrophy with GRADE 2 (psuedonormalization)  diastolic dysfunction. Normal right ventricular systolic function. Normal right ventricular diastolic function. Normal left ventricular wall motion. Normal right ventricular wall motion. Trace tricuspid regurgitation. Mild pulmonary hypertension. Trace mitral regurgitation. No pericardial effusion. Mildly dilated Left atrium Mild LVH

## 2022-06-07 NOTE — ED Provider Notes (Signed)
[facility] Provider Note    Event Date/Time   First MD Initiated Contact with Patient 06/07/22 0211     (approximate)   History   Wound Check   HPI  Edward Pitts is a 62 y.o. 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  Patient has history of melanoma with inguinal node resection.  Since having surgery he has had swelling and some induration in the left inguinal region.  He feels like his pants rubbed against that area and then started having some bleeding.  He put hydrocortisone cream on it and bleeding has stopped.  Patient has some local pain related to the irritation in the inguinal region but denies any increasing pain in the thigh.  Denies any increasing swelling in the thigh.  Says he has chronic lymphedema in that leg.     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   Family history of adverse reaction to anesthesia    PONV mother   GERD (gastroesophageal reflux disease)    History of complete heart block    PPM placed   Hyperlipidemia    Hypertension    LBBB (left bundle branch block)    Lymphedema of left leg    uses thigh high compression stockings   Melanoma ([place]) 2012   skin cancer, left thigh   OSA on CPAP    PONV (postoperative nausea and vomiting) 04/16/2019   Port-A-Cath in place    RIGHT chest wall   Presence of cardiac pacemaker    [employer]   Seizures ([place])    still has episodes of dizziness. last event 1 month ago (march 2022) and will pass out. takes clonazepam     Patient Active Problem List   Diagnosis Date Noted   Skin rash 05/29/2022   Encounter for antineoplastic chemotherapy 03/19/2022   Hyperkalemia 02/21/2022   Cholangiocarcinoma ([place]) 02/21/2022   Postop check 11/14/2021   Anxiety 09/26/2021   S/P CABG x 3 08/10/2021   Coronary artery disease 08/10/2021   S/P placement of cardiac pacemaker 08/08/2021   NSTEMI (non-ST elevated myocardial infarction) ([place]) 08/05/2021   Obesity, Class III, BMI 40-49.9 (morbid obesity) ([place]) 08/05/2021   Elevated serum creatinine 04/15/2021   Episodes of altered cognition 04/15/2021   Degenerative joint disease of knee 07/14/2020   Other specified visual disturbances 07/14/2020   Hx of total hip arthroplasty, left 07/14/2020   Exposure to combat 04/20/2020   Primary osteoarthritis of left hip 04/20/2020   Dizziness 12/22/2019   Arthritis of left knee 11/27/2019   Vitiligo 09/03/2019   Port-A-Cath in place 08/20/2019   Encounter for antineoplastic immunotherapy 08/06/2019   Malignant melanoma of overlapping sites [facility]) 04/23/2019   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[place]) 04/16/2019   Anemia in chronic kidney disease 12/11/2018   Benign hypertensive kidney disease with chronic kidney disease 12/11/2018   Stage 3a chronic kidney disease ([place]) 12/11/2018   AKI (acute kidney injury) ([place])  08/24/2018   HTN (hypertension) 08/24/2018   HLD (hyperlipidemia) 08/24/2018   Seizures ([place]) 08/24/2018   Complete heart block ([place]) 08/24/2018   Arthritis of wrist, left 06/12/2018   Bradycardia 06/04/2018   Chronic gouty arthropathy without tophi 03/13/2018   Positive ANA (antinuclear antibody) 03/05/2018   Swelling of joint of left wrist 03/05/2018   Coronary artery disease involving native coronary artery of native heart 07/09/2017   Stable angina 06/27/2017   Benign essential HTN 05/25/2017   LBBB (left bundle branch block) 05/25/2017     Physical Exam  Triage Vital Signs: ED Triage Vitals   Enc Vitals Group     BP 06/07/22 0111 136/67     Pulse Rate 06/07/22 0111 82     Resp 06/07/22 0111 20     Temp 06/07/22 0111 98.2 F (36.8 C)     Temp Source 06/07/22 0111 Oral     SpO2 06/07/22 0111 97 %     Weight 06/07/22 0111 248 lb (112.5 kg)     Height 06/07/22 0111 '5\' 7"'$  (1.702 m)     Head Circumference --      Peak Flow --      Pain Score 06/07/22 0117 0     Pain Loc --      Pain Edu? --      Excl. in [place]? --     Most recent vital signs: Vitals:   06/07/22 0111  BP: 136/67  Pulse: 82  Resp: 20  Temp: 98.2 F (36.8 C)  SpO2: 97%     General: Awake, no distress.  CV:  Good peripheral perfusion.  Resp:  Normal effort.  Abd:  No distention.  Neuro:             Awake, Alert, Oriented x 3  Other:  There is woody induration which I presume is scar tissue in the left upper thigh and inguinal region, this is nontender, in the inguinal fold there is erythematous rash with small area of skin breakdown in the left lateral scrotum with some dried blood, no active bleeding   ED Results / Procedures / Treatments  Labs (all labs ordered are listed, but only abnormal results are displayed) Labs Reviewed - No data to display   EKG     RADIOLOGY    PROCEDURES:  Critical Care performed: No  Procedures    MEDICATIONS ORDERED IN ED: Medications - No data to display   IMPRESSION / MDM / [place] / ED COURSE  I reviewed the triage vital signs and the nursing notes.                              Patient's presentation is most consistent with acute, uncomplicated illness.  Differential diagnosis includes, but is not limited to, irritant dermatitis, intertrigo, candidal infection, cellulitis  Patient is a 62 year old male with complex past medical history including cholangiocarcinoma, melanoma status post left inguinal node resection and history of CABG who presents because of bleeding in the left inguinal region.  Patient thinks that his pants  rubbed against the left inguinal region causing some irritation he had some bleeding which has self resolved.  He did put topical hydrocortisone on it.  He has chronic lymphedema in that leg ever since he had the melanoma lymph node resection.  On exam there is swelling to the left side which patient tells me is chronic for him and is not increased today.  The upper thigh does have a woody induration to it which also feels chronic likely scar tissue versus lymphedema related and patient said that this is chronic and it is nontender.  In the inguinal fold there is intertrigo it is erythematous and mildly tender.  There is some skin breakdown on the left lateral scrotum where there is some dried blood.  I suspect that the intertrigo, which could be fungal, lead to the skin breakdown and bleeding.  There is no active bleeding currently.  Plan to treat with topical clotrimazole as well as Keflex for potential cellulitis although I think this is less likely.  Will also prescribe topical zinc oxide to serve as a barrier.  With patient's lymphedema and abnormal anatomy in that region he is more prone to getting intertrigo as the skin fold is somewhat deeper.  Recommended he keep the area clean and dry.  Discussed that if it is worsening or pain is increasing return to ED.       FINAL CLINICAL IMPRESSION(S) / ED DIAGNOSES   Final diagnoses:  Intertrigo     Rx / DC Orders   ED Discharge Orders          Ordered    clotrimazole (CLOTRIMAZOLE ANTI-FUNGAL) 1 % cream  2 times daily        06/07/22 0322    cephALEXin (KEFLEX) 500 MG capsule  4 times daily        06/07/22 0322    zinc oxide 20 % ointment  As needed        06/07/22 0325             Note:  This document was prepared using Dragon voice recognition software and may include unintentional dictation errors.   Rada Hay, MD 06/07/22 [phone]

## 2022-06-07 NOTE — Assessment & Plan Note (Signed)
Patient has no radiographic evidence of metastatic melanoma. Off immunotherapy. Continue surveillance 

## 2022-06-08 ENCOUNTER — Other Ambulatory Visit: Payer: Self-pay

## 2022-06-09 ENCOUNTER — Encounter: Payer: Self-pay | Admitting: Internal Medicine

## 2022-06-09 ENCOUNTER — Inpatient Hospital Stay (HOSPITAL_BASED_OUTPATIENT_CLINIC_OR_DEPARTMENT_OTHER): Payer: 59 | Admitting: Internal Medicine

## 2022-06-09 VITALS — BP 129/61 | HR 76 | Temp 96.2°F | Resp 16 | Wt 248.8 lb

## 2022-06-09 DIAGNOSIS — E785 Hyperlipidemia, unspecified: Secondary | ICD-10-CM | POA: Diagnosis not present

## 2022-06-09 DIAGNOSIS — D631 Anemia in chronic kidney disease: Secondary | ICD-10-CM | POA: Diagnosis not present

## 2022-06-09 DIAGNOSIS — B3749 Other urogenital candidiasis: Secondary | ICD-10-CM | POA: Diagnosis not present

## 2022-06-09 DIAGNOSIS — R569 Unspecified convulsions: Secondary | ICD-10-CM | POA: Diagnosis not present

## 2022-06-09 DIAGNOSIS — Z85820 Personal history of malignant melanoma of skin: Secondary | ICD-10-CM | POA: Diagnosis not present

## 2022-06-09 DIAGNOSIS — I252 Old myocardial infarction: Secondary | ICD-10-CM | POA: Diagnosis not present

## 2022-06-09 DIAGNOSIS — N1831 Chronic kidney disease, stage 3a: Secondary | ICD-10-CM | POA: Diagnosis not present

## 2022-06-09 DIAGNOSIS — C221 Intrahepatic bile duct carcinoma: Secondary | ICD-10-CM | POA: Diagnosis not present

## 2022-06-09 DIAGNOSIS — Z452 Encounter for adjustment and management of vascular access device: Secondary | ICD-10-CM | POA: Diagnosis not present

## 2022-06-09 DIAGNOSIS — K760 Fatty (change of) liver, not elsewhere classified: Secondary | ICD-10-CM | POA: Diagnosis not present

## 2022-06-09 DIAGNOSIS — L03314 Cellulitis of groin: Secondary | ICD-10-CM | POA: Diagnosis not present

## 2022-06-09 DIAGNOSIS — Z79899 Other long term (current) drug therapy: Secondary | ICD-10-CM | POA: Diagnosis not present

## 2022-06-09 MED ORDER — CLONAZEPAM 0.5 MG PO TABS: ORAL_TABLET | ORAL | 1 refills | Status: DC

## 2022-06-09 MED ORDER — LACOSAMIDE 100 MG PO TABS: 100.0000 mg | ORAL_TABLET | Freq: Two times a day (BID) | ORAL | 3 refills | Status: DC

## 2022-06-09 NOTE — Progress Notes (Signed)
[facility] [place], [place] [address] [phone]   Interval Evaluation  Date of Service: 06/09/22 Patient Name: Edward Pitts Patient MRN: [id number] Patient DOB: [birth year] Provider: Ventura Sellers, MD  Identifying Statement:  Dyland Carcano is a 62 y.o. male with suspected seizures  Primary Cancer:  Oncologic History: Oncology History  Malignant melanoma of overlapping sites [facility])  04/16/2019 Cancer Staging   Staging form: Melanoma of the Skin, AJCC 8th Edition - Pathologic: Stage Unknown (rpTX, pN1b, cM0) - Signed by [name], MD on 07/27/2020 Stage prefix: Recurrence    04/23/2019 Initial Diagnosis   Malignant melanoma   -He has a history of left lower extremity melanoma in 2011, status post local excision -04/16/2019 patient underwent left groin mass resection  Resection pathology showed malignant melanoma, replacing a lymph node, with extracapsular extension, peripheral and deep margins involved.  Left inguinal contents, all 7 lymph nodes were negative for melanoma in the lymph nodes. Extranodal melanoma identified in lymphatic and interstitium between nodes -PDL1 80% TPS    07/07/2019 -  Radiation Therapy   status post adjuvant radiation.   07/23/2019 - 07/21/2021 Chemotherapy   Nivolumab q14d      06/29/2020 Imaging   CT chest abdomen pelvis showed stable postoperative appearance of the left groin.  No evidence of local recurrence.  No evidence of metastatic disease in the chest abdomen or pelvis.  Hepatic steatosis.  Stable subcentimeter fluid attenuation lesion of the lateral right lobe of the liver, likely benign cyst or hemangioma.  Coronary artery disease.  Aortic atherosclerosis   10/20/2020 Imaging   CT chest abdomen pelvis showed stable postoperative/radiation appearance of the left groin.  No evidence of local recurrence/metastatic disease within the chest abdomen/pelvis.  Fatty liver disease.  Diverticulosis  without evidence of typhlitis.  Aortic atherosclerosis   03/10/2021 Imaging   MRI brain without contrast showed no definitive evidence of intracranial metastatic disease.  Study is limited by absence of intravenous contrast.     04/26/2021 Imaging   CT chest abdomen pelvis without contrast showed stable post operative changes of left groin with no evidence of recurrent disease.  No evidence of metastatic disease in the chest abdomen pelvis.  Aortic atherosclerosis   08/08/2021 - 08/16/2021 Hospital Admission    patient was hospitalized due to NSTEMI status post CABG x3.  He also had pacemaker The echocardiogram showed left ventricular ejection fraction of 65 to 70%,   09/15/2021 Imaging   CT chest abdomen pelvis w contrast  IMPRESSION: 1. Subtle hypodense 9 mm lesion in the left lobe of the liver is new from prior imaging including previous contrasted CT dating back to December 10, 2019, with the lesion appearing to equilibrate with background liver on delayed imaging sequence but is incompletely evaluated on this imaging study and technically nonspecific possibly reflecting a benign perfusional variant and while its appearance is not typical for that of a melanoma metastasis, it is not excluded on this examination. Suggest more definitive characterization by hepatic protocol MRI with and without contrast. 2. Stable postoperative changes in the left groin without evidence of local recurrent disease.3. No evidence of metastatic disease in the chest or pelvis.4.  Aortic Atherosclerosis (ICD10-I70.0).      09/23/2021 Imaging   Contrast-enhanced liver ultrasound Mildly hypoenhancing 2.2 cm mass in the posterior aspect of the left lobe of the liver with washout characteristics concerning for non hepatocellular malignancy, concerning for melanotic metastasis given history. The lesion  is in an unfavorable location for percutaneous biopsy. Consider PET-CT for further characterization   10/21/2021 -   Chemotherapy   Nivolumab q14d      11/10/2021 Imaging   MRI Brain w wo contrast Negative for metastatic disease to the brain   11/10/2021 Imaging   MRI abdomen w wo contrast 1. Lesion of the posterior superior left lobe of the liver, hepatic segment II, measuring 1.9 x 1.8 cm corresponding to findings of prior imaging. Evaluation is somewhat limited breath motion artifact however there is subtle associated rim enhancement of this lesion. Findings are most in keeping with a hepatic metastasis in the setting of known recurrent melanoma. 2. Mild hepatic steatosis. 3. Cardiomegaly.    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 The morphology of the malignant cells between the two cases are dissimilar. A limited panel of immunohistochemical stains was performed and the neoplastic cells are positive for superpancytokeratin, cytokeratin 7 (diffuse, strong), cytokeratin 20 (patchy, moderate) and negative for S100, SOX-10, and HepPar-1.These findings are consistent with carcinoma. The morphologic findings and pattern of immunohistochemical staining are non-specific and possible sites of origin include but are not limited to pancreaticobiliary tract, GI tract, prostate, kidney, lung, and breast. Per [facility], the patient had a PET scan performed in July 2023 which demonstrated a single hepatic  hypermetabolic lesion without other sites of disease.    01/19/2022 Surgery   Liver lesion resection at [facility] by Dr.Zani  A.  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  Tumor extends to hepatic parenchymal margin, but all other margins are negative for  tumor. See synoptic report and comment.   Background liver with: Mild steatosis (20%).  No definitive evidence of steatohepatitis.   Mild periportal fibrosis (trichrome and reticulin). No stainable iron (Prussian blue stain).  No evidence to support alpha-1-antitrypsin deficiency on PAS-D stain.   pT1a pNx   01/25/2022 Cancer Staging   Staging form: Intrahepatic Bile Duct, AJCC 8th Edition - Pathologic stage from 01/25/2022: Stage Unknown (pT1a, pNX, cM0) - Signed by [name], MD on 02/21/2022 Stage prefix: Initial diagnosis   03/23/2022 - 05/01/2022 Radiation Therapy   Concurrent Xeloda 825mg /m2 BID + Radiation.    05/08/2022 -  Chemotherapy   Xeloda [1000 mg/m2 BID for 14 of every 21 days], plan 4 months.   05/08/2022, cycle 1 Xeloda.  Xeloda was stopped on 05/19/22 due to developing rash on face as well as dorsum of hands. Treated with steroid,symptoms resolved.  3/4-3/13/2024  Xeloda was stopped early due to groin cellulitis, he also developed similar skin rash again    05/09/2022 Imaging   CT chest abdomen pelvis with contrast at [facility] showed Status post partial left hepatectomy without CT evidence of recurrent or metastatic disease in the chest, abdomen, pelvis.       Interval History: Shaka Huneke presents for clinical follow up.  He continues to have sporadic seizures, described as blurry vision, head spinning, confusion.  Frequency is 1-2x per month.  Hasn't needed ativan rescue since prior visit.  Had to discontinue xeloda chemotherapy due to diffuse rash.  H+P (04/15/21) Patient presents for follow up given neurologic complaints.  He  has seen neurologist in [place] for several years, relevant history is obtained from review of prior records.  In short, he experiences paroxysmal episodes of "head in a vice, disconnected, in a daze, can't understand language".  Episodes started in 2019, last 1-2 minutes, and occur 2-3x per month.  He thinks frequency decreased when Clonazepam was  started by Dr. Jannifer Franklin in 2019.  He had normal EEG at that time.  Recently was given a trial of Keppra, but he experienced significant dizziness and stopped it.  Had MRI brain without contrast done recently through Dr. Tasia Catchings.  Continues on nivolumab infusions for melanoma.   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  Medications: Current Outpatient Medications on File Prior to Visit  Medication Sig Dispense Refill   apixaban (ELIQUIS) 5 MG TABS tablet Take 5 mg by mouth 2 (two) times daily.     aspirin 81 MG EC tablet Take 81 mg by mouth daily.     atorvastatin (LIPITOR) 20 MG tablet Take 20 mg by mouth daily.     Carboxymeth-Glyc-Polysorb PF (REFRESH OPTIVE MEGA-3) 0.5-1-0.5 % SOLN Place 1 drop into both eyes daily as needed (dry eyes).     cephALEXin (KEFLEX) 500 MG capsule Take 1 capsule (500 mg total) by mouth 4 (four) times daily for 5 days. 20 capsule 0   clonazePAM (KLONOPIN) 0.5 MG tablet TAKE 1 TABLET BY MOUTH IN THE MORNING AND 2 TABS BY MOUTH IN THE EVENING (Patient taking differently: TAKE 1 TABLET BY MOUTH IN THE MORNING AND 1 BY MOUTH IN THE EVENING per pt) 90 tablet 0   clotrimazole (CLOTRIMAZOLE ANTI-FUNGAL) 1 % cream Apply 1 Application topically 2 (two) times daily. Apply to the L inguinal fold 30 g 0   febuxostat (ULORIC) 40 MG tablet Take 40 mg by mouth daily.     ferrous sulfate 325 (65 FE) MG EC tablet TAKE 1 TABLET BY MOUTH 2 TIMES DAILY WITH A MEAL. 180 tablet 1   hydrocortisone 2.5 % ointment Apply 1 application. topically daily as needed (itching).     Lacosamide 100 MG TABS TAKE 1 TABLET (100 MG TOTAL) BY MOUTH IN THE MORNING AND AT BEDTIME. 60 tablet 3   lidocaine-prilocaine (EMLA) cream Apply 1 application. topically as needed. Apply small amount of cream to port site approx 1-2 hours prior to appointment. 30 g 11   lisinopril (ZESTRIL) 20 MG tablet Take 1 tablet by  mouth daily.     metoprolol succinate (TOPROL-XL) 25 MG 24 hr tablet Take 25 mg by mouth daily.     Multiple Vitamin (MULTIVITAMIN WITH MINERALS) TABS tablet Take 1 tablet by mouth daily. Centrum Silver     omeprazole (PRILOSEC) 20 MG capsule TAKE 1 CAPSULE BY MOUTH EVERY DAY 90 capsule 0   ondansetron (ZOFRAN) 4 MG tablet TAKE 1 TABLET BY MOUTH EVERY 8 HOURS AS NEEDED FOR NAUSEA AND VOMITING 90 tablet 1   predniSONE (STERAPRED UNI-PAK 21 TAB) 10 MG (21) TBPK tablet Day 1 take 6 tablets, Day 2 take 5 tablets, Day 3 take 4 tablets, Day 4 take 3 tablets, Day 5 take 2 tablets Day 6 take 1 tablet 1 each 0   zinc oxide 20 % ointment Apply 1 Application topically as needed for irritation. 56.7 g 0   capecitabine (XELODA) 150 MG tablet Take 2 tablets (300 mg total) by mouth 2 (two) times daily after a meal. Take for 14 days,  then hold for 7 days. Repeat every 21 days. Take along with 500mg  tablets. (Patient not taking: Reported on 06/09/2022) 56 tablet 0   capecitabine (XELODA) 500 MG tablet Take 4 tablets (2,000 mg total) by mouth 2 (two) times daily after a meal. Take for 14 days, then hold for 7 days. Repeat every 21 days. Take along with 150mg  tablets. (Patient not taking: Reported on 06/09/2022) 112 tablet 0   traZODone (DESYREL) 50 MG tablet TAKE 1 TABLET BY MOUTH EVERY DAY AT BEDTIME AS NEEDED FOR SLEEP (Patient not taking: Reported on 06/09/2022) 90 tablet 1   Current Facility-Administered Medications on File Prior to Visit  Medication Dose Route Frequency Provider Last Rate Last Admin   heparin lock flush 100 UNIT/ML injection            heparin lock flush 100 UNIT/ML injection             Allergies:  Allergies  Allergen Reactions   Ibuprofen Other (See Comments)    Affects kidneys   Levetiracetam     Other reaction(s): Dizziness, Headache   Nsaids     Affects kidneys   Past Medical History:  Past Medical History:  Diagnosis Date   Anemia    iron treatments   Anxiety    Aortic  atherosclerosis (HCC)    Arthritis    Cancer of groin ([place]) 2021   left groin, resected, radiation   Cataract    Complication of anesthesia    PONV   Coronary artery disease    Dizziness of unknown etiology    has led to seizures and passing out.   Family history of adverse reaction to anesthesia    PONV mother   GERD (gastroesophageal reflux disease)    History of complete heart block    PPM placed   Hyperlipidemia    Hypertension    LBBB (left bundle branch block)    Lymphedema of left leg    uses thigh high compression stockings   Melanoma ([place]) 2012   skin cancer, left thigh   OSA on CPAP    PONV (postoperative nausea and vomiting) 04/16/2019   Port-A-Cath in place    RIGHT chest wall   Presence of cardiac pacemaker    [employer]   Seizures ([place])    still has episodes of dizziness. last event 1 month ago (march 2022) and will pass out. takes clonazepam   Past Surgical History:  Past Surgical History:  Procedure Laterality Date   CORONARY ARTERY BYPASS GRAFT N/A 08/10/2021   Procedure: CORONARY ARTERY BYPASS GRAFTING (CABG) X 3 USING LEFT INTERNAL MAMMARY ARTERY AND RIGHT GREATER SAPHENOUS VEIN;  Surgeon: Dahlia Byes, MD;  Location: [facility];  Service: Open Heart Surgery;  Laterality: N/A;   CT RADIATION THERAPY GUIDE     left groin   dental implant     permanent implant   ENDOVEIN HARVEST OF GREATER SAPHENOUS VEIN Right 08/10/2021   Procedure: ENDOVEIN HARVEST OF GREATER SAPHENOUS VEIN;  Surgeon: Dahlia Byes, MD;  Location: [facility];  Service: Open Heart Surgery;  Laterality: Right;   KNEE SURGERY Left    arthroscopy   LEFT HEART CATH AND CORONARY ANGIOGRAPHY Left 06/29/2017   Procedure: LEFT HEART CATH AND CORONARY ANGIOGRAPHY;  Surgeon: [name], MD;  Location: [facility];  Service: Cardiovascular;  Laterality: Left;   LEFT HEART CATH AND CORONARY ANGIOGRAPHY N/A 08/08/2021   Procedure: LEFT HEART CATH AND CORONARY ANGIOGRAPHY;  Surgeon: [name], MD;  Location: [facility];  Service: Cardiovascular;  Laterality: N/A;   LYMPH NODE DISSECTION Left 04/16/2019   Procedure: Left inguinal Lymph Node Dissection;  Surgeon: [name], MD;  Location: [facility];  Service: General;  Laterality: Left;   MELANOMA EXCISION Left 04/16/2019   Procedure: MELANOMA EXCISION LEFT GROIN MASS;  Surgeon: [name], MD;  Location: [facility];  Service: General;  Laterality: Left;   MELANOMA EXCISION WITH SENTINEL LYMPH NODE BIOPSY Left 2012   Left calf    PACEMAKER INSERTION N/A 08/26/2018   Procedure: INSERTION PACEMAKER;  Surgeon: [name], MD;  Location: [facility];  Service: Cardiovascular;  Laterality: N/A;   PORTA CATH INSERTION N/A 08/26/2019   Procedure: PORTA CATH INSERTION;  Surgeon: Katha Cabal, MD;  Location: [facility];  Service: Cardiovascular;  Laterality: N/A;   SUPERFICIAL LYMPH NODE BIOPSY / EXCISION Left 2020   lymph nodes removed around left groin melanoma site   TEE WITHOUT CARDIOVERSION N/A 08/10/2021   Procedure: TRANSESOPHAGEAL ECHOCARDIOGRAM (TEE);  Surgeon: Dahlia Byes, MD;  Location: [facility];  Service: Open Heart Surgery;  Laterality: N/A;   TEMPORARY PACEMAKER N/A 08/25/2018   Procedure: TEMPORARY PACEMAKER;  Surgeon: [name], MD;  Location: [facility];  Service: Cardiovascular;  Laterality: N/A;   TOTAL HIP ARTHROPLASTY Left 07/14/2020   Procedure: TOTAL HIP ARTHROPLASTY;  Surgeon: Dereck Leep, MD;  Location: [facility];  Service: Orthopedics;  Laterality: Left;   Social History:  Social History   Socioeconomic History   Marital status: Married    Spouse name: Vicente Males    Number of children: 7   Years of education: 12   Highest education level: Not on file  Occupational History    Comment: disability  Tobacco Use   Smoking status: Never   Smokeless tobacco: Never  Vaping Use   Vaping Use: Never used  Substance and Sexual Activity   Alcohol use: No   Drug use: No    Sexual activity: Not Currently  Other Topics Concern   Not on file  Social History Narrative   Lives with  Wife,   Has 2 small dogs   Caffeine use: sodas (2 per day)      Out of work on disability.  Has a walk in shower. No stairs to climb   Oncology treatment ongoing. Uses port a cath for treatment.      pacemaker   Social Determinants of Health   Financial Resource Strain: Low Risk  (02/12/2019)   Overall Financial Resource Strain (CARDIA)    Difficulty of Paying Living Expenses: Not hard at all  Food Insecurity: Food Insecurity Present (02/21/2022)   Hunger Vital Sign    Worried About Running Out of Food in the Last Year: Sometimes true    Ran Out of Food in the Last Year: Often true  Transportation Needs: No Transportation Needs (02/21/2022)   PRAPARE - Hydrologist (Medical): No    Lack of Transportation (Non-Medical): No  Physical Activity: Unknown (02/12/2019)   Exercise Vital Sign    Days of Exercise per Week: 0 days    Minutes of Exercise per Session: Not on file  Stress: No Stress Concern Present (02/12/2019)   [place]    Feeling of Stress : Only a little  Social Connections: Unknown (02/12/2019)   Social Connection and Isolation Panel [NHANES]    Frequency of Communication with Friends and Family: More  than three times a week    Frequency of Social Gatherings with Friends and Family: Not on file    Attends Religious Services: Not on file    Active Member of Clubs or Organizations: Not on file    Attends Archivist Meetings: Not on file    Marital Status: Married  Intimate Partner Violence: Not At Risk (02/21/2022)   Humiliation, Afraid, Rape, and Kick questionnaire    Fear of Current or Ex-Partner: No    Emotionally Abused: No    Physically Abused: No    Sexually Abused: No   Family History:  Family History  Problem Relation Age of Onset   Cancer  Paternal Grandmother     Review of Systems: Constitutional: Doesn't report fevers, chills or abnormal weight loss Eyes: Doesn't report blurriness of vision Ears, nose, mouth, throat, and face: Doesn't report sore throat Respiratory: Doesn't report cough, dyspnea or wheezes Cardiovascular: Doesn't report palpitation, chest discomfort  Gastrointestinal:  Doesn't report nausea, constipation, diarrhea GU: Doesn't report incontinence Skin: Doesn't report skin rashes Neurological: Per HPI Musculoskeletal: Doesn't report joint pain Behavioral/Psych: Doesn't report anxiety  Physical Exam: Wt Readings from Last 3 Encounters:  06/09/22 248 lb 12.8 oz (112.9 kg)  06/07/22 253 lb 4.8 oz (114.9 kg)  06/07/22 248 lb (112.5 kg)   Temp Readings from Last 3 Encounters:  06/09/22 (!) 96.2 F (35.7 C) (Tympanic)  06/07/22 (!) 96.1 F (35.6 C) (Tympanic)  06/07/22 98.2 F (36.8 C) (Oral)   BP Readings from Last 3 Encounters:  06/09/22 129/61  06/07/22 (!) 138/51  06/07/22 136/67   Pulse Readings from Last 3 Encounters:  06/09/22 76  06/07/22 82  06/07/22 82   KPS: 90. General: Alert, cooperative, pleasant, in no acute distress Head: Normal EENT: No conjunctival injection or scleral icterus.  Lungs: Resp effort normal Cardiac: Regular rate Abdomen: Non-distended abdomen Skin: erythematous rash, bilateral hands Extremities: No clubbing or edema  Neurologic Exam: Mental Status: Awake, alert, attentive to examiner. Oriented to self and environment. Language is fluent with intact comprehension.  Cranial Nerves: Visual acuity is grossly normal. Visual fields are full. Extra-ocular movements intact. No ptosis. Face is symmetric Motor: Tone and bulk are normal. Power is full in both arms and legs. Reflexes are symmetric, no pathologic reflexes present.  Sensory: Intact to light touch Gait: Normal.   Labs: I have reviewed the data as listed    Component Value Date/Time   NA 135  06/07/2022 0849   K 4.6 06/07/2022 0849   CL 110 06/07/2022 0849   CO2 20 (L) 06/07/2022 0849   GLUCOSE 171 (H) 06/07/2022 0849   BUN 40 (H) 06/07/2022 0849   CREATININE 1.48 (H) 06/07/2022 0849   CALCIUM 9.0 06/07/2022 0849   PROT 6.9 06/07/2022 0849   ALBUMIN 3.9 06/07/2022 0849   AST 35 06/07/2022 0849   ALT 55 (H) 06/07/2022 0849   ALKPHOS 79 06/07/2022 0849   BILITOT 0.5 06/07/2022 0849   GFRNONAA 53 (L) 06/07/2022 0849   GFRAA >60 12/25/2019 0905   Lab Results  Component Value Date   WBC 3.2 (L) 06/07/2022   NEUTROABS 2.6 06/07/2022   HGB 9.8 (L) 06/07/2022   HCT 28.9 (L) 06/07/2022   MCV 97.6 06/07/2022   PLT 162 06/07/2022    Assessment/Plan Seizures ([place])  Anwar Baack is clinically stable today.  Seizure frequency is unchanged from prior.    Recommended continuing Vimpat 100mg  BID, Clonazepam 0.5/0.5 for seizure prevention.  Ok with PRN rescue  ativan 2mg  as discussed.  We appreciate the opportunity to participate in the care of Tracie Cooler.  He will follow up in clinic in 6 months or sooner if needed.  All questions were answered. The patient knows to call the clinic with any problems, questions or concerns. No barriers to learning were detected.  The total time spent in the encounter was 30 minutes and more than 50% was on counseling and review of test results   Ventura Sellers, MD Medical Director of Neuro-Oncology [facility] [place] 06/09/22 10:27 AM

## 2022-06-09 NOTE — Progress Notes (Signed)
Pt in for follow up, reports had reaction to Xeloda and saw Dr Tasia Catchings earlier this week.  Pt currently on prednisone taper and antibiotics.

## 2022-06-10 ENCOUNTER — Other Ambulatory Visit: Payer: Self-pay | Admitting: Oncology

## 2022-06-12 ENCOUNTER — Encounter: Payer: Self-pay | Admitting: Oncology

## 2022-06-14 ENCOUNTER — Inpatient Hospital Stay (HOSPITAL_BASED_OUTPATIENT_CLINIC_OR_DEPARTMENT_OTHER): Payer: 59 | Admitting: Oncology

## 2022-06-14 ENCOUNTER — Inpatient Hospital Stay: Payer: 59

## 2022-06-14 ENCOUNTER — Encounter: Payer: Self-pay | Admitting: Oncology

## 2022-06-14 VITALS — BP 109/49 | HR 69 | Temp 97.9°F | Resp 18 | Wt 249.7 lb

## 2022-06-14 DIAGNOSIS — K760 Fatty (change of) liver, not elsewhere classified: Secondary | ICD-10-CM | POA: Diagnosis not present

## 2022-06-14 DIAGNOSIS — R569 Unspecified convulsions: Secondary | ICD-10-CM | POA: Diagnosis not present

## 2022-06-14 DIAGNOSIS — Z5111 Encounter for antineoplastic chemotherapy: Secondary | ICD-10-CM | POA: Diagnosis not present

## 2022-06-14 DIAGNOSIS — C221 Intrahepatic bile duct carcinoma: Secondary | ICD-10-CM | POA: Diagnosis not present

## 2022-06-14 DIAGNOSIS — N1831 Chronic kidney disease, stage 3a: Secondary | ICD-10-CM | POA: Diagnosis not present

## 2022-06-14 DIAGNOSIS — I252 Old myocardial infarction: Secondary | ICD-10-CM | POA: Diagnosis not present

## 2022-06-14 DIAGNOSIS — L03314 Cellulitis of groin: Secondary | ICD-10-CM | POA: Diagnosis not present

## 2022-06-14 DIAGNOSIS — B3749 Other urogenital candidiasis: Secondary | ICD-10-CM | POA: Diagnosis not present

## 2022-06-14 DIAGNOSIS — D631 Anemia in chronic kidney disease: Secondary | ICD-10-CM

## 2022-06-14 DIAGNOSIS — Z85820 Personal history of malignant melanoma of skin: Secondary | ICD-10-CM | POA: Diagnosis not present

## 2022-06-14 DIAGNOSIS — Z452 Encounter for adjustment and management of vascular access device: Secondary | ICD-10-CM | POA: Diagnosis not present

## 2022-06-14 DIAGNOSIS — E785 Hyperlipidemia, unspecified: Secondary | ICD-10-CM | POA: Diagnosis not present

## 2022-06-14 DIAGNOSIS — N1832 Chronic kidney disease, stage 3b: Secondary | ICD-10-CM

## 2022-06-14 DIAGNOSIS — C438 Malignant melanoma of overlapping sites of skin: Secondary | ICD-10-CM | POA: Diagnosis not present

## 2022-06-14 DIAGNOSIS — R21 Rash and other nonspecific skin eruption: Secondary | ICD-10-CM

## 2022-06-14 DIAGNOSIS — Z79899 Other long term (current) drug therapy: Secondary | ICD-10-CM | POA: Diagnosis not present

## 2022-06-14 LAB — CBC WITH DIFFERENTIAL (CANCER CENTER ONLY)
Abs Immature Granulocytes: 0.28 10*3/uL — ABNORMAL HIGH (ref 0.00–0.07)
Basophils Absolute: 0 10*3/uL (ref 0.0–0.1)
Basophils Relative: 0 %
Eosinophils Absolute: 0.1 10*3/uL (ref 0.0–0.5)
Eosinophils Relative: 2 %
HCT: 31.2 % — ABNORMAL LOW (ref 39.0–52.0)
Hemoglobin: 10.3 g/dL — ABNORMAL LOW (ref 13.0–17.0)
Immature Granulocytes: 4 %
Lymphocytes Relative: 5 %
Lymphs Abs: 0.4 10*3/uL — ABNORMAL LOW (ref 0.7–4.0)
MCH: 33 pg (ref 26.0–34.0)
MCHC: 33 g/dL (ref 30.0–36.0)
MCV: 100 fL (ref 80.0–100.0)
Monocytes Absolute: 1 10*3/uL (ref 0.1–1.0)
Monocytes Relative: 16 %
Neutro Abs: 4.6 10*3/uL (ref 1.7–7.7)
Neutrophils Relative %: 73 %
Platelet Count: 172 10*3/uL (ref 150–400)
RBC: 3.12 MIL/uL — ABNORMAL LOW (ref 4.22–5.81)
RDW: 16.6 % — ABNORMAL HIGH (ref 11.5–15.5)
WBC Count: 6.4 10*3/uL (ref 4.0–10.5)
nRBC: 0.5 % — ABNORMAL HIGH (ref 0.0–0.2)

## 2022-06-14 LAB — CMP (CANCER CENTER ONLY)
ALT: 45 U/L — ABNORMAL HIGH (ref 0–44)
AST: 34 U/L (ref 15–41)
Albumin: 3.9 g/dL (ref 3.5–5.0)
Alkaline Phosphatase: 74 U/L (ref 38–126)
Anion gap: 6 (ref 5–15)
BUN: 47 mg/dL — ABNORMAL HIGH (ref 8–23)
CO2: 19 mmol/L — ABNORMAL LOW (ref 22–32)
Calcium: 8.8 mg/dL — ABNORMAL LOW (ref 8.9–10.3)
Chloride: 110 mmol/L (ref 98–111)
Creatinine: 1.8 mg/dL — ABNORMAL HIGH (ref 0.61–1.24)
GFR, Estimated: 42 mL/min — ABNORMAL LOW (ref >60)
Glucose, Bld: 149 mg/dL — ABNORMAL HIGH (ref 70–99)
Potassium: 4.6 mmol/L (ref 3.5–5.1)
Sodium: 135 mmol/L (ref 135–145)
Total Bilirubin: 1 mg/dL (ref 0.3–1.2)
Total Protein: 6.8 g/dL (ref 6.5–8.1)

## 2022-06-14 MED ORDER — NYSTATIN 100000 UNIT/GM EX POWD: 1.0000 | Freq: Three times a day (TID) | CUTANEOUS | 1 refills | Status: AC

## 2022-06-14 NOTE — Assessment & Plan Note (Signed)
Encourage oral hydration and avoid nephrotoxins.  Creatinine is worse than his recent baseline.  Will repeat BMP in 2 days.

## 2022-06-14 NOTE — Progress Notes (Signed)
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  S/p chemotherapy Xeloda 825mg /m2 BID+ concurrent RT Currently on adjuvant Xeloda [1000 mg/m2 BID for 14 of every 21 days]  Labs are reviewed and discussed with patient.  Left groin cellulitis/candidiasis have improved. Repeat BMP on 06/16/2022 if creatinine is stable.  Recommend patient to start next cycle of Xeloda on 06/19/2022, plan dose decrease  825mg /m2 BID -2000 mg BID D1-14 patient agrees with the plan.  Anemia in chronic kidney disease Hemoglobin is stable.  Continue to monitor    Candidiasis of scrotum Improved, still has residual erythematous changes.  He is at the risk of recurrent infection, due to chronic left lower extremity swelling Recommend nystatin powder 3 times daily  Encounter for antineoplastic chemotherapy Chemotherapy plan as listed above.   Malignant melanoma of overlapping sites [facility]) Patient has no radiographic evidence of metastatic melanoma on recent CT scan done at [place]. Continue surveillance  Skin rash Bilateral facial and dorsum of hands,  Improved after course of steroids.  Recommend patient continue topical steroids patient to rash on his hands.    Stage 3a chronic kidney disease ([place]) Encourage oral hydration and avoid  nephrotoxins.  Creatinine is worse than his recent baseline.  Will repeat BMP in 2 days.    Follow-up  3 weeks All questions were answered. The patient knows to call the clinic with any problems, questions or concerns.  [name], MD, PhD [facility] Hematology Oncology 06/14/2022    HISTORY OF PRESENTING ILLNESS:   Edward Pitts is a  62 y.o.  male presents for recurrent malignant melanoma.   Oncology History  Malignant melanoma of overlapping sites [facility])  04/16/2019 Cancer Staging   Staging form: Melanoma of the Skin, AJCC 8th Edition - Pathologic: Stage Unknown (rpTX, pN1b, cM0) - Signed by [name], MD on 07/27/2020 Stage prefix: Recurrence    04/23/2019 Initial Diagnosis   Malignant melanoma   -He has a history of left lower extremity melanoma in 2011, status post local excision -04/16/2019 patient underwent left groin mass resection  Resection pathology showed malignant melanoma, replacing a lymph node, with extracapsular extension, peripheral and deep margins involved.  Left inguinal contents, all 7 lymph nodes were negative for melanoma in the lymph nodes. Extranodal melanoma identified in lymphatic and interstitium between nodes -PDL1 80% TPS    07/07/2019 -  Radiation Therapy   status post adjuvant radiation.   07/23/2019 - 07/21/2021 Chemotherapy   Nivolumab q14d      06/29/2020 Imaging   CT chest abdomen pelvis showed stable postoperative appearance of the left groin.  No evidence of local recurrence.  No evidence of metastatic disease in the chest abdomen or pelvis.  Hepatic steatosis.  Stable subcentimeter fluid attenuation lesion of the lateral right lobe of the liver, likely benign cyst or hemangioma.  Coronary artery disease.  Aortic atherosclerosis   10/20/2020 Imaging   CT  chest abdomen pelvis showed stable postoperative/radiation appearance of the left groin.  No evidence of local recurrence/metastatic disease within the chest abdomen/pelvis.  Fatty liver disease.   Diverticulosis without evidence of typhlitis.  Aortic atherosclerosis   03/10/2021 Imaging   MRI brain without contrast showed no definitive evidence of intracranial metastatic disease.  Study is limited by absence of intravenous contrast.     04/26/2021 Imaging   CT chest abdomen pelvis without contrast showed stable post operative changes of left groin with no evidence of recurrent disease.  No evidence of metastatic disease in the chest abdomen pelvis.  Aortic atherosclerosis   08/08/2021 - 08/16/2021 Hospital Admission    patient was hospitalized due to NSTEMI status post CABG x3.  He also had pacemaker The echocardiogram showed left ventricular ejection fraction of 65 to 70%,   09/15/2021 Imaging   CT chest abdomen pelvis w contrast  IMPRESSION: 1. Subtle hypodense 9 mm lesion in the left lobe of the liver is new from prior imaging including previous contrasted CT dating back to December 10, 2019, with the lesion appearing to equilibrate with background liver on delayed imaging sequence but is incompletely evaluated on this imaging study and technically nonspecific possibly reflecting a benign perfusional variant and while its appearance is not typical for that of a melanoma metastasis, it is not excluded on this examination. Suggest more definitive characterization by hepatic protocol MRI with and without contrast. 2. Stable postoperative changes in the left groin without evidence of local recurrent disease.3. No evidence of metastatic disease in the chest or pelvis.4.  Aortic Atherosclerosis (ICD10-I70.0).      09/23/2021 Imaging   Contrast-enhanced liver ultrasound Mildly hypoenhancing 2.2 cm mass in the posterior aspect of the left lobe of the liver with washout characteristics concerning for non hepatocellular malignancy, concerning for melanotic metastasis given history. The lesion is in an unfavorable location for percutaneous biopsy. Consider PET-CT for further characterization    10/21/2021 -  Chemotherapy   Nivolumab q14d      11/10/2021 Imaging   MRI Brain w wo contrast Negative for metastatic disease to the brain   11/10/2021 Imaging   MRI abdomen w wo contrast 1. Lesion of the posterior superior left lobe of the liver, hepatic segment II, measuring 1.9 x 1.8 cm corresponding to findings of prior imaging. Evaluation is somewhat limited breath motion artifact however there is subtle associated rim enhancement of this lesion. Findings are most in keeping with a hepatic metastasis in the setting of known recurrent melanoma. 2. Mild hepatic steatosis. 3. Cardiomegaly.    02/27/2022 -  Chemotherapy   Patient is on Treatment Plan : Capecitabine (825 mg/m2 bid) + XRT     Cholangiocarcinoma ([place])  12/19/2021 Initial Diagnosis   Liver biopsy showed carcinoma  -The slides on the patient's prior left inguinal lymph node biopsy [id number]) with metastatic melanoma were reviewed in conjunction with  this case. The morphology of the malignant cells between the two cases are dissimilar. A limited panel of immunohistochemical stains was performed and the neoplastic cells are positive for superpancytokeratin, cytokeratin 7 (diffuse, strong), cytokeratin 20 (patchy, moderate) and negative for S100, SOX-10, and HepPar-1.These findings are consistent with carcinoma. The morphologic findings and pattern of immunohistochemical staining are non-specific and possible sites of origin include but are not limited to pancreaticobiliary tract, GI tract, prostate, kidney, lung, and breast. Per [facility], the patient had a PET scan performed in July 2023 which demonstrated a single hepatic  hypermetabolic lesion without other sites of  disease.    01/19/2022 Surgery   Liver lesion resection at [facility] by Dr.Zani  A.  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  Tumor extends to hepatic parenchymal margin, but all other margins are  negative for tumor. See synoptic report and comment.   Background liver with: Mild steatosis (20%).  No definitive evidence of steatohepatitis.   Mild periportal fibrosis (trichrome and reticulin). No stainable iron (Prussian blue stain).  No evidence to support alpha-1-antitrypsin deficiency on PAS-D stain.   pT1a pNx   01/25/2022 Cancer Staging   Staging form: Intrahepatic Bile Duct, AJCC 8th Edition - Pathologic stage from 01/25/2022: Stage Unknown (pT1a, pNX, cM0) - Signed by [name], MD on 02/21/2022 Stage prefix: Initial diagnosis   03/23/2022 - 05/01/2022 Radiation Therapy   Concurrent Xeloda 825mg /m2 BID + Radiation.    05/08/2022 -  Chemotherapy   Xeloda [1000 mg/m2 BID for 14 of every 21 days], plan 4 months.   05/08/2022, cycle 1 Xeloda.  Xeloda was stopped on 05/19/22 due to developing rash on face as well as dorsum of hands. Treated with steroid,symptoms resolved.  3/4-3/13/2024  Xeloda was stopped early due to groin cellulitis, he also developed similar skin rash again  Plan 3/25 cycle 3 dose reduce Xeloda 825 mg/m2   2000mg  BID x 14 days.   05/09/2022 Imaging   CT chest abdomen pelvis with contrast at [facility] showed Status post partial left hepatectomy without CT evidence of recurrent or metastatic disease in the chest, abdomen, pelvis.      04/18/2021, patient establish care with neurology Dr. Mickeal Skinner for intermittent altered cognition.  he was recommended to start Vimpat.    INTERVAL HISTORY Edward Pitts is a 62 y.o. male who has above history reviewed by me today presents for follow up visit for management of inguinal nodal recurrence of melanoma, resected T1a cholangiocarcinoma. Patient finished antibiotics for left groin cellulitis.  He also has used clotrimazole cream topically. Skin rash on his face and bilateral hands have improved.  No new complaints today.   :Review of Systems  Constitutional:  Negative for appetite change, chills, fatigue, fever and unexpected  weight change.  HENT:   Negative for hearing loss and voice change.   Eyes:  Negative for eye problems and icterus.  Respiratory:  Negative for chest tightness, cough and shortness of breath.   Cardiovascular:  Positive for leg swelling. Negative for chest pain.  Gastrointestinal:  Negative for abdominal distention and abdominal pain.  Endocrine: Negative for hot flashes.  Genitourinary:  Negative for difficulty urinating, dysuria and frequency.   Musculoskeletal:        Status post left hip replacement  Skin:  Positive for rash. Negative for itching.       Skin hypo-pigmentation on upper extremities, no change Rash on his hands and groin.  Neurological:  Negative for light-headedness and numbness.       Spells  Hematological:  Negative for adenopathy. Does not bruise/bleed easily.  Psychiatric/Behavioral:  Negative for confusion.     MEDICAL HISTORY:  Past Medical History:  Diagnosis Date   Anemia    iron treatments   Anxiety    Aortic atherosclerosis ([facility])    Arthritis    Cancer of groin ([place]) 2021   left groin, resected, radiation   Cataract    Complication of anesthesia    PONV   Coronary artery disease    Dizziness of unknown etiology    has led  to seizures and passing out.   Family history of adverse reaction to anesthesia    PONV mother   GERD (gastroesophageal reflux disease)    History of complete heart block    PPM placed   Hyperlipidemia    Hypertension    LBBB (left bundle branch block)    Lymphedema of left leg    uses thigh high compression stockings   Melanoma ([place]) 2012   skin cancer, left thigh   OSA on CPAP    PONV (postoperative nausea and vomiting) 04/16/2019   Port-A-Cath in place    RIGHT chest wall   Presence of cardiac pacemaker    [employer]   Seizures ([place])    still has episodes of dizziness. last event 1 month ago (march 2022) and will pass out. takes clonazepam    SURGICAL HISTORY: Past Surgical History:  Procedure Laterality Date    CORONARY ARTERY BYPASS GRAFT N/A 08/10/2021   Procedure: CORONARY ARTERY BYPASS GRAFTING (CABG) X 3 USING LEFT INTERNAL MAMMARY ARTERY AND RIGHT GREATER SAPHENOUS VEIN;  Surgeon: Dahlia Byes, MD;  Location: [place];  Service: Open Heart Surgery;  Laterality: N/A;   CT RADIATION THERAPY GUIDE     left groin   dental implant     permanent implant   ENDOVEIN HARVEST OF GREATER SAPHENOUS VEIN Right 08/10/2021   Procedure: ENDOVEIN HARVEST OF GREATER SAPHENOUS VEIN;  Surgeon: Dahlia Byes, MD;  Location: [place];  Service: Open Heart Surgery;  Laterality: Right;   KNEE SURGERY Left    arthroscopy   LEFT HEART CATH AND CORONARY ANGIOGRAPHY Left 06/29/2017   Procedure: LEFT HEART CATH AND CORONARY ANGIOGRAPHY;  Surgeon: [name], MD;  Location: [place] [facility];  Service: Cardiovascular;  Laterality: Left;   LEFT HEART CATH AND CORONARY ANGIOGRAPHY N/A 08/08/2021   Procedure: LEFT HEART CATH AND CORONARY ANGIOGRAPHY;  Surgeon: [name], MD;  Location: [place] [facility];  Service: Cardiovascular;  Laterality: N/A;   LYMPH NODE DISSECTION Left 04/16/2019   Procedure: Left inguinal Lymph Node Dissection;  Surgeon: [name], MD;  Location: [place];  Service: General;  Laterality: Left;   MELANOMA EXCISION Left 04/16/2019   Procedure: MELANOMA EXCISION LEFT GROIN MASS;  Surgeon: [name], MD;  Location: [place];  Service: General;  Laterality: Left;   MELANOMA EXCISION WITH SENTINEL LYMPH NODE BIOPSY Left 2012   Left calf    PACEMAKER INSERTION N/A 08/26/2018   Procedure: INSERTION PACEMAKER;  Surgeon: [name], MD;  Location: [facility];  Service: Cardiovascular;  Laterality: N/A;   PORTA CATH INSERTION N/A 08/26/2019   Procedure: PORTA CATH INSERTION;  Surgeon: Katha Cabal, MD;  Location: [place] [facility];  Service: Cardiovascular;  Laterality: N/A;   SUPERFICIAL LYMPH NODE BIOPSY / EXCISION Left 2020   lymph nodes removed around left groin melanoma site    TEE WITHOUT CARDIOVERSION N/A 08/10/2021   Procedure: TRANSESOPHAGEAL ECHOCARDIOGRAM (TEE);  Surgeon: Dahlia Byes, MD;  Location: [place];  Service: Open Heart Surgery;  Laterality: N/A;   TEMPORARY PACEMAKER N/A 08/25/2018   Procedure: TEMPORARY PACEMAKER;  Surgeon: [name], MD;  Location: [place] [facility];  Service: Cardiovascular;  Laterality: N/A;   TOTAL HIP ARTHROPLASTY Left 07/14/2020   Procedure: TOTAL HIP ARTHROPLASTY;  Surgeon: Dereck Leep, MD;  Location: [facility];  Service: Orthopedics;  Laterality: Left;    SOCIAL HISTORY: Social History   Socioeconomic History   Marital status: Married    Spouse name: Vicente Males  Number of children: 7   Years of education: 12   Highest education level: Not on file  Occupational History    Comment: disability  Tobacco Use   Smoking status: Never   Smokeless tobacco: Never  Vaping Use   Vaping Use: Never used  Substance and Sexual Activity   Alcohol use: No   Drug use: No   Sexual activity: Not Currently  Other Topics Concern   Not on file  Social History Narrative   Lives with  Wife,   Has 2 small dogs   Caffeine use: sodas (2 per day)      Out of work on disability.  Has a walk in shower. No stairs to climb   Oncology treatment ongoing. Uses port a cath for treatment.      pacemaker   Social Determinants of Health   Financial Resource Strain: Low Risk  (02/12/2019)   Overall Financial Resource Strain (CARDIA)    Difficulty of Paying Living Expenses: Not hard at all  Food Insecurity: Food Insecurity Present (02/21/2022)   Hunger Vital Sign    Worried About Running Out of Food in the Last Year: Sometimes true    Ran Out of Food in the Last Year: Often true  Transportation Needs: No Transportation Needs (02/21/2022)   PRAPARE - Hydrologist (Medical): No    Lack of Transportation (Non-Medical): No  Physical Activity: Unknown (02/12/2019)   Exercise Vital Sign    Days of  Exercise per Week: 0 days    Minutes of Exercise per Session: Not on file  Stress: No Stress Concern Present (02/12/2019)   Panacea    Feeling of Stress : Only a little  Social Connections: Unknown (02/12/2019)   Social Connection and Isolation Panel [NHANES]    Frequency of Communication with Friends and Family: More than three times a week    Frequency of Social Gatherings with Friends and Family: Not on file    Attends Religious Services: Not on file    Active Member of Clubs or Organizations: Not on file    Attends Archivist Meetings: Not on file    Marital Status: Married  Intimate Partner Violence: Not At Risk (02/21/2022)   Humiliation, Afraid, Rape, and Kick questionnaire    Fear of Current or Ex-Partner: No    Emotionally Abused: No    Physically Abused: No    Sexually Abused: No    FAMILY HISTORY: Family History  Problem Relation Age of Onset   Cancer Paternal Grandmother     ALLERGIES:  is allergic to ibuprofen, levetiracetam, and nsaids.  MEDICATIONS:  Current Outpatient Medications  Medication Sig Dispense Refill   apixaban (ELIQUIS) 5 MG TABS tablet Take 5 mg by mouth 2 (two) times daily.     aspirin 81 MG EC tablet Take 81 mg by mouth daily.     atorvastatin (LIPITOR) 20 MG tablet Take 20 mg by mouth daily.     Carboxymeth-Glyc-Polysorb PF (REFRESH OPTIVE MEGA-3) 0.5-1-0.5 % SOLN Place 1 drop into both eyes daily as needed (dry eyes).     clonazePAM (KLONOPIN) 0.5 MG tablet TAKE 1 TABLET BY MOUTH IN THE MORNING AND 1 BY MOUTH IN THE EVENING per pt 120 tablet 1   clotrimazole (CLOTRIMAZOLE ANTI-FUNGAL) 1 % cream Apply 1 Application topically 2 (two) times daily. Apply to the L inguinal fold 30 g 0   febuxostat (ULORIC) 40 MG tablet Take 40  mg by mouth daily.     ferrous sulfate 325 (65 FE) MG EC tablet TAKE 1 TABLET BY MOUTH 2 TIMES DAILY WITH A MEAL. 180 tablet 1   hydrocortisone 2.5  % ointment Apply 1 application. topically daily as needed (itching).     Lacosamide 100 MG TABS Take 1 tablet (100 mg total) by mouth in the morning and at bedtime. 60 tablet 3   lidocaine-prilocaine (EMLA) cream Apply 1 application. topically as needed. Apply small amount of cream to port site approx 1-2 hours prior to appointment. 30 g 11   lisinopril (ZESTRIL) 20 MG tablet Take 1 tablet by mouth daily.     metoprolol succinate (TOPROL-XL) 25 MG 24 hr tablet Take 25 mg by mouth daily.     Multiple Vitamin (MULTIVITAMIN WITH MINERALS) TABS tablet Take 1 tablet by mouth daily. Centrum Silver     nystatin (MYCOSTATIN/NYSTOP) powder Apply 1 Application topically 3 (three) times daily. 60 g 1   omeprazole (PRILOSEC) 20 MG capsule TAKE 1 CAPSULE BY MOUTH EVERY DAY 90 capsule 0   ondansetron (ZOFRAN) 4 MG tablet TAKE 1 TABLET BY MOUTH EVERY 8 HOURS AS NEEDED FOR NAUSEA AND VOMITING 90 tablet 1   zinc oxide 20 % ointment Apply 1 Application topically as needed for irritation. 56.7 g 0   capecitabine (XELODA) 150 MG tablet Take 2 tablets (300 mg total) by mouth 2 (two) times daily after a meal. Take for 14 days, then hold for 7 days. Repeat every 21 days. Take along with 500mg  tablets. (Patient not taking: Reported on 06/09/2022) 56 tablet 0   capecitabine (XELODA) 500 MG tablet Take 4 tablets (2,000 mg total) by mouth 2 (two) times daily after a meal. Take for 14 days, then hold for 7 days. Repeat every 21 days. Take along with 150mg  tablets. (Patient not taking: Reported on 06/09/2022) 112 tablet 0   predniSONE (STERAPRED UNI-PAK 21 TAB) 10 MG (21) TBPK tablet Day 1 take 6 tablets, Day 2 take 5 tablets, Day 3 take 4 tablets, Day 4 take 3 tablets, Day 5 take 2 tablets Day 6 take 1 tablet (Patient not taking: Reported on 06/14/2022) 1 each 0   traZODone (DESYREL) 50 MG tablet TAKE 1 TABLET BY MOUTH EVERY DAY AT BEDTIME AS NEEDED FOR SLEEP (Patient not taking: Reported on 06/09/2022) 90 tablet 1   No current  facility-administered medications for this visit.   Facility-Administered Medications Ordered in Other Visits  Medication Dose Route Frequency Provider Last Rate Last Admin   heparin lock flush 100 UNIT/ML injection            heparin lock flush 100 UNIT/ML injection              PHYSICAL EXAMINATION: ECOG PERFORMANCE STATUS: 1 - Symptomatic but completely ambulatory Vitals:   06/14/22 0928  BP: (!) 109/49  Pulse: 69  Resp: 18  Temp: 97.9 F (36.6 C)  SpO2: 98%   Filed Weights   06/14/22 0928  Weight: 249 lb 11.2 oz (113.3 kg)    Physical Exam Constitutional:      General: He is not in acute distress.    Comments: Patient ambulates independently  HENT:     Head: Normocephalic and atraumatic.  Eyes:     General: No scleral icterus.    Pupils: Pupils are equal, round, and reactive to light.  Cardiovascular:     Rate and Rhythm: Normal rate and regular rhythm.     Heart sounds: Normal heart sounds.  Pulmonary:     Effort: Pulmonary effort is normal. No respiratory distress.     Breath sounds: No wheezing.  Abdominal:     General: Bowel sounds are normal. There is no distension.     Palpations: Abdomen is soft. There is no mass.     Tenderness: There is no abdominal tenderness.     Comments:    Musculoskeletal:        General: No deformity. Normal range of motion.     Cervical back: Normal range of motion and neck supple.     Comments: Left lower extremity edema-chronic   Skin:    General: Skin is warm and dry.     Comments: Bilateral dorsum had raised erythematous rash-improved Left groin chronic edema with erythematous changes.-Improved with residual erythema  Neurological:     Mental Status: He is alert and oriented to person, place, and time. Mental status is at baseline.     Cranial Nerves: No cranial nerve deficit.     Coordination: Coordination normal.  Psychiatric:        Mood and Affect: Mood normal.     LABORATORY DATA:  I have reviewed the data  as listed    Latest Ref Rng & Units 06/14/2022    9:15 AM 06/07/2022    8:49 AM 05/29/2022    9:43 AM  CBC  WBC 4.0 - 10.5 K/uL 6.4  3.2  4.7   Hemoglobin 13.0 - 17.0 g/dL 10.3  9.8  11.0   Hematocrit 39.0 - 52.0 % 31.2  28.9  32.2   Platelets 150 - 400 K/uL 172  162  136       Latest Ref Rng & Units 06/14/2022    9:15 AM 06/07/2022    8:49 AM 05/29/2022    9:43 AM  CMP  Glucose 70 - 99 mg/dL 149  171  110   BUN 8 - 23 mg/dL 47  40  41   Creatinine 0.61 - 1.24 mg/dL 1.80  1.48  1.39   Sodium 135 - 145 mmol/L 135  135  135   Potassium 3.5 - 5.1 mmol/L 4.6  4.6  5.1   Chloride 98 - 111 mmol/L 110  110  111   CO2 22 - 32 mmol/L 19  20  18    Calcium 8.9 - 10.3 mg/dL 8.8  9.0  8.8   Total Protein 6.5 - 8.1 g/dL 6.8  6.9  6.9   Total Bilirubin 0.3 - 1.2 mg/dL 1.0  0.5  0.6   Alkaline Phos 38 - 126 U/L 74  79  74   AST 15 - 41 U/L 34  35  32   ALT 0 - 44 U/L 45  55  40      RADIOGRAPHIC STUDIES: I have personally reviewed the radiological images as listed and agreed with the findings in the report. PCV ECHOCARDIOGRAM COMPLETE  Result Date: 05/16/2022 Images from the original result were not included. Reason for Visit  INDICATIONS:   The patient is Z00.00. Echocardiogram: An echocardiogram in (2-d) mode was performed and in Doppler mode with color flow velocity mapping was performed. ventricular septum thickness 1.23 cm, L ventricular posterior wall thickness (diastole) 1.22 cm, left atrium size 4.2 cm, aortic root diameter 3.6 cm, L ventricle diastolic dimension 0000000 cm, L ventricle systolic dimension [id number], L ventricle ejection fraction 55.6 %, and LV fractional shortening 28.8 % L ventricular outflow tract internal diameter 3.2 cm, L ventricular outflow tract flow velocity 1.38 m/s,  aortic valve cusps 1.7 cm , aortic valve flow velocity 1.71 (m/sec), aortic valve systolic calculated mean flow gradient 8 mmHg, mitral valve diastolic peak flow velocity E 1.24 m/sec, and mitral valve diastolic  peak flow E/A ratio 3.2 % Mitral valve has trace regurgitation Tricuspid valve has trace regurgitation ASSESSMENT Technically adequate study. Mild left ventricular hypertrophy with GRADE 2 (psuedonormalization)  diastolic dysfunction. Normal right ventricular systolic function. Normal right ventricular diastolic function. Normal left ventricular wall motion. Normal right ventricular wall motion. Trace tricuspid regurgitation. Mild pulmonary hypertension. Trace mitral regurgitation. No pericardial effusion. Mildly dilated Left atrium Mild LVH

## 2022-06-14 NOTE — Assessment & Plan Note (Signed)
Chemotherapy plan as listed above 

## 2022-06-14 NOTE — Assessment & Plan Note (Signed)
Bilateral facial and dorsum of hands,  Improved after course of steroids.  Recommend patient continue topical steroids patient to rash on his hands.

## 2022-06-14 NOTE — Assessment & Plan Note (Signed)
Patient has no radiographic evidence of metastatic melanoma on recent CT scan done at [place]. Continue surveillance

## 2022-06-14 NOTE — Assessment & Plan Note (Signed)
Improved, still has residual erythematous changes.  He is at the risk of recurrent infection, due to chronic left lower extremity swelling Recommend nystatin powder 3 times daily

## 2022-06-14 NOTE — Assessment & Plan Note (Signed)
Hemoglobin is stable. Continue to monitor 

## 2022-06-14 NOTE — Assessment & Plan Note (Addendum)
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  S/p chemotherapy Xeloda 825mg /m2 BID+ concurrent RT Currently on adjuvant Xeloda [1000 mg/m2 BID for 14 of every 21 days]  Labs are reviewed and discussed with patient.  Left groin cellulitis/candidiasis have improved. Repeat BMP on 06/16/2022 if creatinine is stable.  Recommend patient to start next cycle of Xeloda on 06/19/2022, plan dose decrease  825mg /m2 BID -2000 mg BID D1-14 patient agrees with the plan.

## 2022-06-15 ENCOUNTER — Ambulatory Visit (INDEPENDENT_AMBULATORY_CARE_PROVIDER_SITE_OTHER): Payer: 59 | Admitting: Family

## 2022-06-15 ENCOUNTER — Encounter: Payer: Self-pay | Admitting: Family

## 2022-06-15 ENCOUNTER — Encounter: Payer: Self-pay | Admitting: Oncology

## 2022-06-15 VITALS — BP 110/64 | HR 74 | Ht 67.0 in | Wt 250.0 lb

## 2022-06-15 DIAGNOSIS — R7303 Prediabetes: Secondary | ICD-10-CM

## 2022-06-15 DIAGNOSIS — E782 Mixed hyperlipidemia: Secondary | ICD-10-CM | POA: Diagnosis not present

## 2022-06-15 DIAGNOSIS — I10 Essential (primary) hypertension: Secondary | ICD-10-CM

## 2022-06-15 NOTE — Progress Notes (Signed)
Established Patient Office Visit  Subjective:  Patient ID: Edward Pitts, male    DOB: 1960-11-26  Age: 62 y.o. MRN: [id number]  Chief Complaint  Patient presents with   Follow-up    3 month follow up    Patient is here today for his 3 months follow up.  He has been feeling fairly well since last appointment.   He does not have additional concerns to discuss today.  Labs are due today, but he has an appointment tomorrow for labs for oncology.  He does not need refills.   I have reviewed his active problem list, medication list, allergies, notes from last encounter, lab results for his appointment today.   No other concerns at this time.   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   Family history of adverse reaction to anesthesia    PONV mother   GERD (gastroesophageal reflux disease)    History of complete heart block    PPM placed   Hyperlipidemia    Hypertension    LBBB (left bundle branch block)    Lymphedema of left leg    uses thigh high compression stockings   Melanoma ([place]) 2012   skin cancer, left thigh   OSA on CPAP    PONV (postoperative nausea and vomiting) 04/16/2019   Port-A-Cath in place    RIGHT chest wall   Presence of cardiac pacemaker    [employer]   Seizures ([place])    still has episodes of dizziness. last event 1 month ago (march 2022) and will pass out. takes clonazepam    Past Surgical History:  Procedure Laterality Date   CORONARY ARTERY BYPASS GRAFT N/A 08/10/2021   Procedure: CORONARY ARTERY BYPASS GRAFTING (CABG) X 3 USING LEFT INTERNAL MAMMARY ARTERY AND RIGHT GREATER SAPHENOUS VEIN;  Surgeon: Dahlia Byes, MD;  Location: [facility];  Service:  Open Heart Surgery;  Laterality: N/A;   CT RADIATION THERAPY GUIDE     left groin   dental implant     permanent implant   ENDOVEIN HARVEST OF GREATER SAPHENOUS VEIN Right 08/10/2021   Procedure: ENDOVEIN HARVEST OF GREATER SAPHENOUS VEIN;  Surgeon: Dahlia Byes, MD;  Location: [facility];  Service: Open Heart Surgery;  Laterality: Right;   KNEE SURGERY Left    arthroscopy   LEFT HEART CATH AND CORONARY ANGIOGRAPHY Left 06/29/2017   Procedure: LEFT HEART CATH AND CORONARY ANGIOGRAPHY;  Surgeon: [name], MD;  Location: [facility];  Service: Cardiovascular;  Laterality: Left;   LEFT HEART CATH AND CORONARY ANGIOGRAPHY N/A 08/08/2021   Procedure: LEFT HEART CATH AND CORONARY ANGIOGRAPHY;  Surgeon: [name], MD;  Location: [facility];  Service: Cardiovascular;  Laterality: N/A;   LYMPH NODE DISSECTION Left 04/16/2019   Procedure: Left inguinal Lymph Node Dissection;  Surgeon: [name], MD;  Location: [facility];  Service: General;  Laterality: Left;   MELANOMA EXCISION Left 04/16/2019   Procedure: MELANOMA EXCISION LEFT GROIN MASS;  Surgeon: [name], MD;  Location: [facility];  Service: General;  Laterality: Left;   MELANOMA EXCISION WITH SENTINEL  LYMPH NODE BIOPSY Left 2012   Left calf    PACEMAKER INSERTION N/A 08/26/2018   Procedure: INSERTION PACEMAKER;  Surgeon: [name], MD;  Location: [facility];  Service: Cardiovascular;  Laterality: N/A;   PORTA CATH INSERTION N/A 08/26/2019   Procedure: PORTA CATH INSERTION;  Surgeon: Katha Cabal, MD;  Location: [facility];  Service: Cardiovascular;  Laterality: N/A;   SUPERFICIAL LYMPH NODE BIOPSY / EXCISION Left 2020   lymph nodes removed around left groin melanoma site   TEE WITHOUT CARDIOVERSION N/A 08/10/2021   Procedure: TRANSESOPHAGEAL ECHOCARDIOGRAM (TEE);  Surgeon: Dahlia Byes, MD;  Location: [facility];  Service: Open Heart Surgery;  Laterality: N/A;   TEMPORARY PACEMAKER N/A 08/25/2018    Procedure: TEMPORARY PACEMAKER;  Surgeon: [name], MD;  Location: [facility];  Service: Cardiovascular;  Laterality: N/A;   TOTAL HIP ARTHROPLASTY Left 07/14/2020   Procedure: TOTAL HIP ARTHROPLASTY;  Surgeon: Dereck Leep, MD;  Location: [facility];  Service: Orthopedics;  Laterality: Left;    Social History   Socioeconomic History   Marital status: Married    Spouse name: Vicente Males    Number of children: 7   Years of education: 12   Highest education level: Not on file  Occupational History    Comment: disability  Tobacco Use   Smoking status: Never   Smokeless tobacco: Never  Vaping Use   Vaping Use: Never used  Substance and Sexual Activity   Alcohol use: No   Drug use: No   Sexual activity: Not Currently  Other Topics Concern   Not on file  Social History Narrative   Lives with  Wife,   Has 2 small dogs   Caffeine use: sodas (2 per day)      Out of work on disability.  Has a walk in shower. No stairs to climb   Oncology treatment ongoing. Uses port a cath for treatment.      pacemaker   Social Determinants of Health   Financial Resource Strain: Low Risk  (02/12/2019)   Overall Financial Resource Strain (CARDIA)    Difficulty of Paying Living Expenses: Not hard at all  Food Insecurity: Food Insecurity Present (02/21/2022)   Hunger Vital Sign    Worried About Running Out of Food in the Last Year: Sometimes true    Ran Out of Food in the Last Year: Often true  Transportation Needs: No Transportation Needs (02/21/2022)   PRAPARE - Hydrologist (Medical): No    Lack of Transportation (Non-Medical): No  Physical Activity: Unknown (02/12/2019)   Exercise Vital Sign    Days of Exercise per Week: 0 days    Minutes of Exercise per Session: Not on file  Stress: No Stress Concern Present (02/12/2019)   [place]    Feeling of Stress : Only a little  Social  Connections: Unknown (02/12/2019)   Social Connection and Isolation Panel [NHANES]    Frequency of Communication with Friends and Family: More than three times a week    Frequency of Social Gatherings with Friends and Family: Not on file    Attends Religious Services: Not on file    Active Member of Clubs or Organizations: Not on file    Attends Archivist Meetings: Not on file    Marital Status: Married  Intimate Partner Violence: Not At Risk (02/21/2022)   Humiliation, Afraid, Rape, and Kick questionnaire    Fear  of Current or Ex-Partner: No    Emotionally Abused: No    Physically Abused: No    Sexually Abused: No    Family History  Problem Relation Age of Onset   Cancer Paternal Grandmother     Allergies  Allergen Reactions   Ibuprofen Other (See Comments)    Affects kidneys   Levetiracetam     Other reaction(s): Dizziness, Headache   Nsaids     Affects kidneys    Review of Systems  All other systems reviewed and are negative.      Objective:   BP 110/64   Pulse 74   Ht 5\' 7"  (1.702 m)   Wt 250 lb (113.4 kg)   SpO2 97%   BMI 39.16 kg/m   Vitals:   06/15/22 0925  BP: 110/64  Pulse: 74  Height: 5\' 7"  (1.702 m)  Weight: 250 lb (113.4 kg)  SpO2: 97%  BMI (Calculated): 39.15    Physical Exam Vitals and nursing note reviewed.  Constitutional:      Appearance: Normal appearance. He is normal weight.  Eyes:     Pupils: Pupils are equal, round, and reactive to light.  Cardiovascular:     Rate and Rhythm: Normal rate and regular rhythm.     Pulses: Normal pulses.     Heart sounds: Normal heart sounds.  Pulmonary:     Effort: Pulmonary effort is normal.     Breath sounds: Normal breath sounds.  Neurological:     Mental Status: He is alert.  Psychiatric:        Mood and Affect: Mood normal.        Behavior: Behavior normal.      No results found for any visits on 06/15/22.  Recent Results (from the past 2160 hour(s))  Basic metabolic  panel     Status: Abnormal   Collection Time: 03/23/22  8:32 AM  Result Value Ref Range   Sodium 139 135 - 145 mmol/L   Potassium 4.6 3.5 - 5.1 mmol/L   Chloride 110 98 - 111 mmol/L   CO2 21 (L) 22 - 32 mmol/L   Glucose, Bld 142 (H) 70 - 99 mg/dL    Comment: Glucose reference range applies only to samples taken after fasting for at least 8 hours.   BUN 38 (H) 8 - 23 mg/dL   Creatinine, Ser 1.62 (H) 0.61 - 1.24 mg/dL   Calcium 8.9 8.9 - 10.3 mg/dL   GFR, Estimated 48 (L) >60 mL/min    Comment: (NOTE) Calculated using the CKD-EPI Creatinine Equation (2021)    Anion gap 8 5 - 15    Comment: Performed at [facility], [address] [place], [place] [address]  Rad Sandria Senter Session Summary     Status: None   Collection Time: 03/23/22  9:13 AM  Result Value Ref Range   Course ID [email or web address]    Course Intent Unknown    Course Start Date 03/13/2022 11:22 AM    Session Number 1    Course First Treatment Date 03/23/2022  9:11 AM    Course Last Treatment Date 03/23/2022  9:12 AM    Course Elapsed Days 0    Reference Point ID Liver DP    Reference Point Dosage Given to Date 1.8 Gy   Reference Point Session Dosage Given 1.8 Gy   Plan ID Abd    Plan Name Abd    Plan Fractions Treated to Date 1    Plan Total Fractions Prescribed 25  Plan Prescribed Dose Per Fraction 1.8 Gy   Plan Total Prescribed Dose 45.000000 Gy   Plan Primary Reference Point Liver DP   Rad Onc Aria Session Summary     Status: None   Collection Time: 03/24/22 11:33 AM  Result Value Ref Range   Course ID [email or web address]    Course Intent Unknown    Course Start Date 03/13/2022 11:22 AM    Session Number 2    Course First Treatment Date 03/23/2022  9:11 AM    Course Last Treatment Date 03/24/2022 11:31 AM    Course Elapsed Days 1    Reference Point ID Liver DP    Reference Point Dosage Given to Date 3.6 Gy   Reference Point Session Dosage Given 1.8 Gy   Plan ID Abd    Plan Name Abd    Plan Fractions Treated to Date 2     Plan Total Fractions Prescribed 25    Plan Prescribed Dose Per Fraction 1.8 Gy   Plan Total Prescribed Dose 45.000000 Gy   Plan Primary Reference Point Liver DP   Rad Onc Aria Session Summary     Status: None   Collection Time: 03/28/22 11:07 AM  Result Value Ref Range   Course ID [email or web address]    Course Intent Unknown    Course Start Date 03/13/2022 11:22 AM    Session Number 3    Course First Treatment Date 03/23/2022  9:11 AM    Course Last Treatment Date 03/28/2022 11:06 AM    Course Elapsed Days 5    Reference Point ID Liver DP    Reference Point Dosage Given to Date 5.4 Gy   Reference Point Session Dosage Given 1.8 Gy   Plan ID Abd    Plan Name Abd    Plan Fractions Treated to Date 3    Plan Total Fractions Prescribed 25    Plan Prescribed Dose Per Fraction 1.8 Gy   Plan Total Prescribed Dose 45.000000 Gy   Plan Primary Reference Point Liver DP   Rad Onc Aria Session Summary     Status: None   Collection Time: 03/29/22 11:16 AM  Result Value Ref Range   Course ID [email or web address]    Course Intent Unknown    Course Start Date 03/13/2022 11:22 AM    Session Number 4    Course First Treatment Date 03/23/2022  9:11 AM    Course Last Treatment Date 03/29/2022 11:14 AM    Course Elapsed Days 6    Reference Point ID Liver DP    Reference Point Dosage Given to Date 7.2 Gy   Reference Point Session Dosage Given 1.8 Gy   Plan ID Abd    Plan Name Abd    Plan Fractions Treated to Date 4    Plan Total Fractions Prescribed 25    Plan Prescribed Dose Per Fraction 1.8 Gy   Plan Total Prescribed Dose 45.000000 Gy   Plan Primary Reference Point Liver DP   Comprehensive metabolic panel     Status: Abnormal   Collection Time: 03/30/22  9:37 AM  Result Value Ref Range   Sodium 137 135 - 145 mmol/L   Potassium 4.4 3.5 - 5.1 mmol/L   Chloride 109 98 - 111 mmol/L   CO2 23 22 - 32 mmol/L   Glucose, Bld 138 (H) 70 - 99 mg/dL    Comment: Glucose reference range applies only to samples taken after  fasting for at least 8 hours.   BUN 32 (H) 8 -  23 mg/dL   Creatinine, Ser 1.49 (H) 0.61 - 1.24 mg/dL   Calcium 8.7 (L) 8.9 - 10.3 mg/dL   Total Protein 6.9 6.5 - 8.1 g/dL   Albumin 4.0 3.5 - 5.0 g/dL   AST 24 15 - 41 U/L   ALT 27 0 - 44 U/L   Alkaline Phosphatase 67 38 - 126 U/L   Total Bilirubin 0.6 0.3 - 1.2 mg/dL   GFR, Estimated 53 (L) >60 mL/min    Comment: (NOTE) Calculated using the CKD-EPI Creatinine Equation (2021)    Anion gap 5 5 - 15    Comment: Performed at [facility], [address] [place], [place] [address]  CBC with Differential/Platelet     Status: Abnormal   Collection Time: 03/30/22  9:37 AM  Result Value Ref Range   WBC 4.4 4.0 - 10.5 K/uL   RBC 3.89 (L) 4.22 - 5.81 MIL/uL   Hemoglobin 11.9 (L) 13.0 - 17.0 g/dL   HCT 35.0 (L) 39.0 - 52.0 %   MCV 90.0 80.0 - 100.0 fL   MCH 30.6 26.0 - 34.0 pg   MCHC 34.0 30.0 - 36.0 g/dL   RDW 13.2 11.5 - 15.5 %   Platelets 180 150 - 400 K/uL   nRBC 0.0 0.0 - 0.2 %   Neutrophils Relative % 71 %   Neutro Abs 3.2 1.7 - 7.7 K/uL   Lymphocytes Relative 15 %   Lymphs Abs 0.7 0.7 - 4.0 K/uL   Monocytes Relative 10 %   Monocytes Absolute 0.4 0.1 - 1.0 K/uL   Eosinophils Relative 2 %   Eosinophils Absolute 0.1 0.0 - 0.5 K/uL   Basophils Relative 1 %   Basophils Absolute 0.0 0.0 - 0.1 K/uL   Immature Granulocytes 1 %   Abs Immature Granulocytes 0.05 0.00 - 0.07 K/uL    Comment: Performed at [facility], [address] [place], [place] [address]  Rad Sandria Senter Session Summary     Status: None   Collection Time: 03/30/22 11:27 AM  Result Value Ref Range   Course ID [email or web address]    Course Intent Unknown    Course Start Date 03/13/2022 11:22 AM    Session Number 5    Course First Treatment Date 03/23/2022  9:11 AM    Course Last Treatment Date 03/30/2022 11:26 AM    Course Elapsed Days 7    Reference Point ID Liver DP    Reference Point Dosage Given to Date 9 Gy   Reference Point Session Dosage Given 1.8 Gy    Plan ID Abd    Plan Name Abd    Plan Fractions Treated to Date 5    Plan Total Fractions Prescribed 25    Plan Prescribed Dose Per Fraction 1.8 Gy   Plan Total Prescribed Dose 45.000000 Gy   Plan Primary Reference Point Liver DP   Rad Onc Aria Session Summary     Status: None   Collection Time: 03/31/22 10:59 AM  Result Value Ref Range   Course ID [email or web address]    Course Intent Unknown    Course Start Date 03/13/2022 11:22 AM    Session Number 6    Course First Treatment Date 03/23/2022  9:11 AM    Course Last Treatment Date 03/31/2022 10:58 AM    Course Elapsed Days 8    Reference Point ID Liver DP    Reference Point Dosage Given to Date 10.8 Gy   Reference Point Session Dosage Given 1.8 Gy  Plan ID Abd    Plan Name Abd    Plan Fractions Treated to Date 6    Plan Total Fractions Prescribed 25    Plan Prescribed Dose Per Fraction 1.8 Gy   Plan Total Prescribed Dose 45.000000 Gy   Plan Primary Reference Point Liver DP   Rad Onc Aria Session Summary     Status: None   Collection Time: 04/03/22 11:00 AM  Result Value Ref Range   Course ID [email or web address]    Course Intent Unknown    Course Start Date 03/13/2022 11:22 AM    Session Number 7    Course First Treatment Date 03/23/2022  9:11 AM    Course Last Treatment Date 04/03/2022 10:59 AM    Course Elapsed Days 11    Reference Point ID Liver DP    Reference Point Dosage Given to Date 12.6 Gy   Reference Point Session Dosage Given 1.8 Gy   Plan ID Abd    Plan Name Abd    Plan Fractions Treated to Date 7    Plan Total Fractions Prescribed 25    Plan Prescribed Dose Per Fraction 1.8 Gy   Plan Total Prescribed Dose 45.000000 Gy   Plan Primary Reference Point Liver DP   Rad Onc Aria Session Summary     Status: None   Collection Time: 04/04/22  9:55 AM  Result Value Ref Range   Course ID [email or web address]    Course Intent Unknown    Course Start Date 03/13/2022 11:22 AM    Session Number 8    Course First Treatment Date 03/23/2022  9:11 AM    Course  Last Treatment Date 04/04/2022  9:53 AM    Course Elapsed Days 12    Reference Point ID Liver DP    Reference Point Dosage Given to Date 14.4 Gy   Reference Point Session Dosage Given 1.8 Gy   Plan ID Abd    Plan Name Abd    Plan Fractions Treated to Date 8    Plan Total Fractions Prescribed 25    Plan Prescribed Dose Per Fraction 1.8 Gy   Plan Total Prescribed Dose 45.000000 Gy   Plan Primary Reference Point Liver DP   Rad Onc Aria Session Summary     Status: None   Collection Time: 04/06/22  9:51 AM  Result Value Ref Range   Course ID [email or web address]    Course Intent Unknown    Course Start Date 03/13/2022 11:22 AM    Session Number 9    Course First Treatment Date 03/23/2022  9:11 AM    Course Last Treatment Date 04/06/2022  9:50 AM    Course Elapsed Days 14    Reference Point ID Liver DP    Reference Point Dosage Given to Date 16.2 Gy   Reference Point Session Dosage Given 1.8 Gy   Plan ID Abd    Plan Name Abd    Plan Fractions Treated to Date 9    Plan Total Fractions Prescribed 25    Plan Prescribed Dose Per Fraction 1.8 Gy   Plan Total Prescribed Dose 45.000000 Gy   Plan Primary Reference Point Liver DP   Rad Onc Aria Session Summary     Status: None   Collection Time: 04/07/22  9:54 AM  Result Value Ref Range   Course ID [email or web address]    Course Intent Unknown    Course Start Date 03/13/2022 11:22 AM    Session Number 10    Course First Treatment Date 03/23/2022  9:11 AM    Course Last Treatment Date 04/07/2022  9:52 AM    Course Elapsed Days 15    Reference Point ID Liver DP    Reference Point Dosage Given to Date 18 Gy   Reference Point Session Dosage Given 1.8 Gy   Plan ID Abd    Plan Name Abd    Plan Fractions Treated to Date 10    Plan Total Fractions Prescribed 25    Plan Prescribed Dose Per Fraction 1.8 Gy   Plan Total Prescribed Dose 45.000000 Gy   Plan Primary Reference Point Liver DP   Rad Onc Aria Session Summary     Status: None   Collection Time: 04/10/22  8:32 AM   Result Value Ref Range   Course ID [email or web address]    Course Intent Unknown    Course Start Date 03/13/2022 11:22 AM    Session Number 11    Course First Treatment Date 03/23/2022  9:11 AM    Course Last Treatment Date 04/10/2022  8:31 AM    Course Elapsed Days 18    Reference Point ID Liver DP    Reference Point Dosage Given to Date 19.8 Gy   Reference Point Session Dosage Given 1.8 Gy   Plan ID Abd    Plan Name Abd    Plan Fractions Treated to Date 11    Plan Total Fractions Prescribed 25    Plan Prescribed Dose Per Fraction 1.8 Gy   Plan Total Prescribed Dose 45.000000 Gy   Plan Primary Reference Point Liver DP   Comprehensive metabolic panel     Status: Abnormal   Collection Time: 04/10/22  8:49 AM  Result Value Ref Range   Sodium 137 135 - 145 mmol/L   Potassium 4.3 3.5 - 5.1 mmol/L   Chloride 109 98 - 111 mmol/L   CO2 21 (L) 22 - 32 mmol/L   Glucose, Bld 110 (H) 70 - 99 mg/dL    Comment: Glucose reference range applies only to samples taken after fasting for at least 8 hours.   BUN 24 (H) 8 - 23 mg/dL   Creatinine, Ser 1.44 (H) 0.61 - 1.24 mg/dL   Calcium 8.5 (L) 8.9 - 10.3 mg/dL   Total Protein 6.6 6.5 - 8.1 g/dL   Albumin 3.8 3.5 - 5.0 g/dL   AST 28 15 - 41 U/L   ALT 28 0 - 44 U/L   Alkaline Phosphatase 68 38 - 126 U/L   Total Bilirubin 0.4 0.3 - 1.2 mg/dL   GFR, Estimated 55 (L) >60 mL/min    Comment: (NOTE) Calculated using the CKD-EPI Creatinine Equation (2021)    Anion gap 7 5 - 15    Comment: Performed at [facility], [address] [place], [place] [address]  CBC with Differential/Platelet     Status: Abnormal   Collection Time: 04/10/22  8:49 AM  Result Value Ref Range   WBC 4.1 4.0 - 10.5 K/uL   RBC 3.72 (L) 4.22 - 5.81 MIL/uL   Hemoglobin 11.3 (L) 13.0 - 17.0 g/dL   HCT 34.1 (L) 39.0 - 52.0 %   MCV 91.7 80.0 - 100.0 fL   MCH 30.4 26.0 - 34.0 pg   MCHC 33.1 30.0 - 36.0 g/dL   RDW 13.6 11.5 - 15.5 %   Platelets 149 (L) 150 - 400 K/uL   nRBC 0.0  0.0 - 0.2 %   Neutrophils Relative % 75 %   Neutro Abs 3.1 1.7 - 7.7 K/uL  Lymphocytes Relative 10 %   Lymphs Abs 0.4 (L) 0.7 - 4.0 K/uL   Monocytes Relative 11 %   Monocytes Absolute 0.5 0.1 - 1.0 K/uL   Eosinophils Relative 3 %   Eosinophils Absolute 0.1 0.0 - 0.5 K/uL   Basophils Relative 0 %   Basophils Absolute 0.0 0.0 - 0.1 K/uL   Immature Granulocytes 1 %   Abs Immature Granulocytes 0.03 0.00 - 0.07 K/uL    Comment: Performed at [facility], [address] [place], [place] [address]  Rad Sandria Senter Session Summary     Status: None   Collection Time: 04/11/22  9:49 AM  Result Value Ref Range   Course ID [email or web address]    Course Intent Unknown    Course Start Date 03/13/2022 11:22 AM    Session Number 12    Course First Treatment Date 03/23/2022  9:11 AM    Course Last Treatment Date 04/11/2022  9:48 AM    Course Elapsed Days 19    Reference Point ID Liver DP    Reference Point Dosage Given to Date 21.6 Gy   Reference Point Session Dosage Given 1.8 Gy   Plan ID Abd    Plan Name Abd    Plan Fractions Treated to Date 12    Plan Total Fractions Prescribed 25    Plan Prescribed Dose Per Fraction 1.8 Gy   Plan Total Prescribed Dose 45.000000 Gy   Plan Primary Reference Point Liver DP   Rad Onc Aria Session Summary     Status: None   Collection Time: 04/12/22  9:52 AM  Result Value Ref Range   Course ID [email or web address]    Course Intent Unknown    Course Start Date 03/13/2022 11:22 AM    Session Number 13    Course First Treatment Date 03/23/2022  9:11 AM    Course Last Treatment Date 04/12/2022  9:50 AM    Course Elapsed Days 20    Reference Point ID Liver DP    Reference Point Dosage Given to Date 23.4 Gy   Reference Point Session Dosage Given 1.8 Gy   Plan ID Abd    Plan Name Abd    Plan Fractions Treated to Date 13    Plan Total Fractions Prescribed 25    Plan Prescribed Dose Per Fraction 1.8 Gy   Plan Total Prescribed Dose 45.000000 Gy   Plan Primary Reference Point Liver  DP   Rad Onc Aria Session Summary     Status: None   Collection Time: 04/13/22  9:53 AM  Result Value Ref Range   Course ID [email or web address]    Course Intent Unknown    Course Start Date 03/13/2022 11:22 AM    Session Number 14    Course First Treatment Date 03/23/2022  9:11 AM    Course Last Treatment Date 04/13/2022  9:52 AM    Course Elapsed Days 21    Reference Point ID Liver DP    Reference Point Dosage Given to Date 25.2 Gy   Reference Point Session Dosage Given 1.8 Gy   Plan ID Abd    Plan Name Abd    Plan Fractions Treated to Date 14    Plan Total Fractions Prescribed 25    Plan Prescribed Dose Per Fraction 1.8 Gy   Plan Total Prescribed Dose 45.000000 Gy   Plan Primary Reference Point Liver DP   Rad Onc Aria Session Summary     Status: None   Collection Time: 04/14/22 10:01 AM  Result Value Ref Range   Course ID [email or web address]    Course Intent Unknown    Course Start Date 03/13/2022 11:22 AM    Session Number 15    Course First Treatment Date 03/23/2022  9:11 AM    Course Last Treatment Date 04/14/2022 10:00 AM    Course Elapsed Days 22    Reference Point ID Liver DP    Reference Point Dosage Given to Date 27 Gy   Reference Point Session Dosage Given 1.8 Gy   Plan ID Abd    Plan Name Abd    Plan Fractions Treated to Date 15    Plan Total Fractions Prescribed 25    Plan Prescribed Dose Per Fraction 1.8 Gy   Plan Total Prescribed Dose 45.000000 Gy   Plan Primary Reference Point Liver DP   Rad Onc Aria Session Summary     Status: None   Collection Time: 04/18/22  9:55 AM  Result Value Ref Range   Course ID [email or web address]    Course Intent Unknown    Course Start Date 03/13/2022 11:22 AM    Session Number 16    Course First Treatment Date 03/23/2022  9:11 AM    Course Last Treatment Date 04/18/2022  9:54 AM    Course Elapsed Days 26    Reference Point ID Liver DP    Reference Point Dosage Given to Date 28.8 Gy   Reference Point Session Dosage Given 1.8 Gy   Plan ID Abd    Plan Name Abd     Plan Fractions Treated to Date 16    Plan Total Fractions Prescribed 25    Plan Prescribed Dose Per Fraction 1.8 Gy   Plan Total Prescribed Dose 45.000000 Gy   Plan Primary Reference Point Liver DP   Rad Onc Aria Session Summary     Status: None   Collection Time: 04/19/22  9:48 AM  Result Value Ref Range   Course ID [email or web address]    Course Intent Unknown    Course Start Date 03/13/2022 11:22 AM    Session Number 17    Course First Treatment Date 03/23/2022  9:11 AM    Course Last Treatment Date 04/19/2022  9:47 AM    Course Elapsed Days 27    Reference Point ID Liver DP    Reference Point Dosage Given to Date 30.6 Gy   Reference Point Session Dosage Given 1.8 Gy   Plan ID Abd    Plan Name Abd    Plan Fractions Treated to Date 17    Plan Total Fractions Prescribed 25    Plan Prescribed Dose Per Fraction 1.8 Gy   Plan Total Prescribed Dose 45.000000 Gy   Plan Primary Reference Point Liver DP   Rad Onc Aria Session Summary     Status: None   Collection Time: 04/20/22 10:16 AM  Result Value Ref Range   Course ID [email or web address]    Course Intent Unknown    Course Start Date 03/13/2022 11:22 AM    Session Number 18    Course First Treatment Date 03/23/2022  9:11 AM    Course Last Treatment Date 04/20/2022 10:15 AM    Course Elapsed Days 28    Reference Point ID Liver DP    Reference Point Dosage Given to Date 32.4 Gy   Reference Point Session Dosage Given 1.8 Gy   Plan ID Abd    Plan Name Abd    Plan Fractions Treated to Date 89    Plan Total Fractions  Prescribed 25    Plan Prescribed Dose Per Fraction 1.8 Gy   Plan Total Prescribed Dose 45.000000 Gy   Plan Primary Reference Point Liver DP   Comprehensive metabolic panel     Status: Abnormal   Collection Time: 04/20/22 10:30 AM  Result Value Ref Range   Sodium 139 135 - 145 mmol/L   Potassium 4.6 3.5 - 5.1 mmol/L   Chloride 110 98 - 111 mmol/L   CO2 22 22 - 32 mmol/L   Glucose, Bld 138 (H) 70 - 99 mg/dL    Comment: Glucose reference  range applies only to samples taken after fasting for at least 8 hours.   BUN 36 (H) 8 - 23 mg/dL   Creatinine, Ser 1.42 (H) 0.61 - 1.24 mg/dL   Calcium 9.3 8.9 - 10.3 mg/dL   Total Protein 7.3 6.5 - 8.1 g/dL   Albumin 4.1 3.5 - 5.0 g/dL   AST 29 15 - 41 U/L   ALT 30 0 - 44 U/L   Alkaline Phosphatase 68 38 - 126 U/L   Total Bilirubin 0.5 0.3 - 1.2 mg/dL   GFR, Estimated 56 (L) >60 mL/min    Comment: (NOTE) Calculated using the CKD-EPI Creatinine Equation (2021)    Anion gap 7 5 - 15    Comment: Performed at [facility], [address] [place], [place] [address]  CBC with Differential/Platelet     Status: Abnormal   Collection Time: 04/20/22 10:30 AM  Result Value Ref Range   WBC 3.2 (L) 4.0 - 10.5 K/uL   RBC 3.64 (L) 4.22 - 5.81 MIL/uL   Hemoglobin 11.4 (L) 13.0 - 17.0 g/dL   HCT 35.3 (L) 39.0 - 52.0 %   MCV 97.0 80.0 - 100.0 fL   MCH 31.3 26.0 - 34.0 pg   MCHC 32.3 30.0 - 36.0 g/dL   RDW 14.6 11.5 - 15.5 %   Platelets 143 (L) 150 - 400 K/uL   nRBC 0.0 0.0 - 0.2 %   Neutrophils Relative % 75 %   Neutro Abs 2.4 1.7 - 7.7 K/uL   Lymphocytes Relative 9 %   Lymphs Abs 0.3 (L) 0.7 - 4.0 K/uL   Monocytes Relative 12 %   Monocytes Absolute 0.4 0.1 - 1.0 K/uL   Eosinophils Relative 3 %   Eosinophils Absolute 0.1 0.0 - 0.5 K/uL   Basophils Relative 0 %   Basophils Absolute 0.0 0.0 - 0.1 K/uL   Immature Granulocytes 1 %   Abs Immature Granulocytes 0.03 0.00 - 0.07 K/uL    Comment: Performed at [facility], [address] [place], [place] [address]  Rad Sandria Senter Session Summary     Status: None   Collection Time: 04/21/22  9:58 AM  Result Value Ref Range   Course ID [email or web address]    Course Intent Unknown    Course Start Date 03/13/2022 11:22 AM    Session Number 19    Course First Treatment Date 03/23/2022  9:11 AM    Course Last Treatment Date 04/21/2022  9:57 AM    Course Elapsed Days 29    Reference Point ID Liver DP    Reference Point Dosage Given to Date 34.2  Gy   Reference Point Session Dosage Given 1.8 Gy   Plan ID Abd    Plan Name Abd    Plan Fractions Treated to Date 19    Plan Total Fractions Prescribed 25    Plan Prescribed Dose Per Fraction 1.8 Gy  Plan Total Prescribed Dose 45.000000 Gy   Plan Primary Reference Point Liver DP   Rad Onc Aria Session Summary     Status: None   Collection Time: 04/24/22  9:53 AM  Result Value Ref Range   Course ID [email or web address]    Course Intent Unknown    Course Start Date 03/13/2022 11:22 AM    Session Number 20    Course First Treatment Date 03/23/2022  9:11 AM    Course Last Treatment Date 04/24/2022  9:52 AM    Course Elapsed Days 32    Reference Point ID Liver DP    Reference Point Dosage Given to Date 36 Gy   Reference Point Session Dosage Given 1.8 Gy   Plan ID Abd    Plan Name Abd    Plan Fractions Treated to Date 20    Plan Total Fractions Prescribed 25    Plan Prescribed Dose Per Fraction 1.8 Gy   Plan Total Prescribed Dose 45.000000 Gy   Plan Primary Reference Point Liver DP   Rad Onc Aria Session Summary     Status: None   Collection Time: 04/25/22  9:50 AM  Result Value Ref Range   Course ID [email or web address]    Course Intent Unknown    Course Start Date 03/13/2022 11:22 AM    Session Number 21    Course First Treatment Date 03/23/2022  9:11 AM    Course Last Treatment Date 04/25/2022  9:48 AM    Course Elapsed Days 33    Reference Point ID Liver DP    Reference Point Dosage Given to Date 37.8 Gy   Reference Point Session Dosage Given 1.8 Gy   Plan ID Abd    Plan Name Abd    Plan Fractions Treated to Date 11    Plan Total Fractions Prescribed 25    Plan Prescribed Dose Per Fraction 1.8 Gy   Plan Total Prescribed Dose 45.000000 Gy   Plan Primary Reference Point Liver DP   Rad Onc Aria Session Summary     Status: None   Collection Time: 04/26/22 10:01 AM  Result Value Ref Range   Course ID [email or web address]    Course Intent Unknown    Course Start Date 03/13/2022 11:22 AM    Session Number 22     Course First Treatment Date 03/23/2022  9:11 AM    Course Last Treatment Date 04/26/2022 10:00 AM    Course Elapsed Days 34    Reference Point ID Liver DP    Reference Point Dosage Given to Date 39.6 Gy   Reference Point Session Dosage Given 1.8 Gy   Plan ID Abd    Plan Name Abd    Plan Fractions Treated to Date 22    Plan Total Fractions Prescribed 25    Plan Prescribed Dose Per Fraction 1.8 Gy   Plan Total Prescribed Dose 45.000000 Gy   Plan Primary Reference Point Liver DP   Rad Onc Aria Session Summary     Status: None   Collection Time: 04/27/22 12:17 PM  Result Value Ref Range   Course ID [email or web address]    Course Intent Unknown    Course Start Date 03/13/2022 11:22 AM    Session Number 23    Course First Treatment Date 03/23/2022  9:11 AM    Course Last Treatment Date 04/27/2022 12:16 PM    Course Elapsed Days 35    Reference Point ID Liver DP    Reference Point Dosage Given to Date 41.4 Gy  Reference Point Session Dosage Given 1.8 Gy   Plan ID Abd    Plan Name Abd    Plan Fractions Treated to Date 23    Plan Total Fractions Prescribed 25    Plan Prescribed Dose Per Fraction 1.8 Gy   Plan Total Prescribed Dose 45.000000 Gy   Plan Primary Reference Point Liver DP   Rad Onc Aria Session Summary     Status: None   Collection Time: 04/28/22  9:56 AM  Result Value Ref Range   Course ID [email or web address]    Course Intent Unknown    Course Start Date 03/13/2022 11:22 AM    Session Number 24    Course First Treatment Date 03/23/2022  9:11 AM    Course Last Treatment Date 04/28/2022  9:54 AM    Course Elapsed Days 36    Reference Point ID Liver DP    Reference Point Dosage Given to Date 43.2 Gy   Reference Point Session Dosage Given 1.8 Gy   Plan ID Abd    Plan Name Abd    Plan Fractions Treated to Date 24    Plan Total Fractions Prescribed 25    Plan Prescribed Dose Per Fraction 1.8 Gy   Plan Total Prescribed Dose 45.000000 Gy   Plan Primary Reference Point Liver DP   Rad Onc Aria Session  Summary     Status: None   Collection Time: 05/01/22  9:48 AM  Result Value Ref Range   Course ID [email or web address]    Course Intent Unknown    Course Start Date 03/13/2022 11:22 AM    Session Number 25    Course First Treatment Date 03/23/2022  9:11 AM    Course Last Treatment Date 05/01/2022  9:47 AM    Course Elapsed Days 39    Reference Point ID Liver DP    Reference Point Dosage Given to Date 45 Gy   Reference Point Session Dosage Given 1.8 Gy   Plan ID Abd    Plan Name Abd    Plan Fractions Treated to Date 25    Plan Total Fractions Prescribed 25    Plan Prescribed Dose Per Fraction 1.8 Gy   Plan Total Prescribed Dose 45.000000 Gy   Plan Primary Reference Point Liver DP   Comprehensive metabolic panel     Status: Abnormal   Collection Time: 05/03/22  8:49 AM  Result Value Ref Range   Sodium 136 135 - 145 mmol/L   Potassium 4.5 3.5 - 5.1 mmol/L   Chloride 107 98 - 111 mmol/L   CO2 21 (L) 22 - 32 mmol/L   Glucose, Bld 156 (H) 70 - 99 mg/dL    Comment: Glucose reference range applies only to samples taken after fasting for at least 8 hours.   BUN 36 (H) 8 - 23 mg/dL   Creatinine, Ser 1.41 (H) 0.61 - 1.24 mg/dL   Calcium 8.9 8.9 - 10.3 mg/dL   Total Protein 6.9 6.5 - 8.1 g/dL   Albumin 4.0 3.5 - 5.0 g/dL   AST 36 15 - 41 U/L   ALT 32 0 - 44 U/L   Alkaline Phosphatase 74 38 - 126 U/L   Total Bilirubin 0.5 0.3 - 1.2 mg/dL   GFR, Estimated 56 (L) >60 mL/min    Comment: (NOTE) Calculated using the CKD-EPI Creatinine Equation (2021)    Anion gap 8 5 - 15    Comment: Performed at [facility], [address], [place], [place] [address]  CBC with  Differential/Platelet     Status: Abnormal   Collection Time: 05/03/22  8:49 AM  Result Value Ref Range   WBC 3.4 (L) 4.0 - 10.5 K/uL   RBC 3.59 (L) 4.22 - 5.81 MIL/uL   Hemoglobin 11.3 (L) 13.0 - 17.0 g/dL   HCT 33.9 (L) 39.0 - 52.0 %   MCV 94.4 80.0 - 100.0 fL   MCH 31.5 26.0 - 34.0 pg   MCHC 33.3 30.0 - 36.0 g/dL   RDW 15.2 11.5  - 15.5 %   Platelets 142 (L) 150 - 400 K/uL   nRBC 0.0 0.0 - 0.2 %   Neutrophils Relative % 72 %   Neutro Abs 2.5 1.7 - 7.7 K/uL   Lymphocytes Relative 9 %   Lymphs Abs 0.3 (L) 0.7 - 4.0 K/uL   Monocytes Relative 13 %   Monocytes Absolute 0.4 0.1 - 1.0 K/uL   Eosinophils Relative 3 %   Eosinophils Absolute 0.1 0.0 - 0.5 K/uL   Basophils Relative 1 %   Basophils Absolute 0.0 0.0 - 0.1 K/uL   Immature Granulocytes 2 %   Abs Immature Granulocytes 0.05 0.00 - 0.07 K/uL    Comment: Performed at [facility], [address] [place], [place] [address]  Comprehensive metabolic panel     Status: Abnormal   Collection Time: 05/16/22  9:54 AM  Result Value Ref Range   Sodium 136 135 - 145 mmol/L   Potassium 5.4 (H) 3.5 - 5.1 mmol/L   Chloride 109 98 - 111 mmol/L   CO2 20 (L) 22 - 32 mmol/L   Glucose, Bld 106 (H) 70 - 99 mg/dL    Comment: Glucose reference range applies only to samples taken after fasting for at least 8 hours.   BUN 39 (H) 8 - 23 mg/dL   Creatinine, Ser 1.44 (H) 0.61 - 1.24 mg/dL   Calcium 9.1 8.9 - 10.3 mg/dL   Total Protein 7.1 6.5 - 8.1 g/dL   Albumin 4.0 3.5 - 5.0 g/dL   AST 32 15 - 41 U/L   ALT 38 0 - 44 U/L   Alkaline Phosphatase 76 38 - 126 U/L   Total Bilirubin 0.5 0.3 - 1.2 mg/dL   GFR, Estimated 55 (L) >60 mL/min    Comment: (NOTE) Calculated using the CKD-EPI Creatinine Equation (2021)    Anion gap 7 5 - 15    Comment: Performed at [facility], [address] [place], [place] [address]  CBC with Differential/Platelet     Status: Abnormal   Collection Time: 05/16/22  9:54 AM  Result Value Ref Range   WBC 3.9 (L) 4.0 - 10.5 K/uL   RBC 3.38 (L) 4.22 - 5.81 MIL/uL   Hemoglobin 10.8 (L) 13.0 - 17.0 g/dL   HCT 31.9 (L) 39.0 - 52.0 %   MCV 94.4 80.0 - 100.0 fL   MCH 32.0 26.0 - 34.0 pg   MCHC 33.9 30.0 - 36.0 g/dL   RDW 14.6 11.5 - 15.5 %   Platelets 162 150 - 400 K/uL   nRBC 0.0 0.0 - 0.2 %   Neutrophils Relative % 76 %   Neutro Abs 2.9  1.7 - 7.7 K/uL   Lymphocytes Relative 8 %   Lymphs Abs 0.3 (L) 0.7 - 4.0 K/uL   Monocytes Relative 13 %   Monocytes Absolute 0.5 0.1 - 1.0 K/uL   Eosinophils Relative 2 %   Eosinophils Absolute 0.1 0.0 - 0.5 K/uL   Basophils Relative 0 %  Basophils Absolute 0.0 0.0 - 0.1 K/uL   Immature Granulocytes 1 %   Abs Immature Granulocytes 0.04 0.00 - 0.07 K/uL    Comment: Performed at [facility], [address] [place], [place] [address]  Comprehensive metabolic panel     Status: Abnormal   Collection Time: 05/29/22  9:43 AM  Result Value Ref Range   Sodium 135 135 - 145 mmol/L   Potassium 5.1 3.5 - 5.1 mmol/L   Chloride 111 98 - 111 mmol/L   CO2 18 (L) 22 - 32 mmol/L   Glucose, Bld 110 (H) 70 - 99 mg/dL    Comment: Glucose reference range applies only to samples taken after fasting for at least 8 hours.   BUN 41 (H) 8 - 23 mg/dL   Creatinine, Ser 1.39 (H) 0.61 - 1.24 mg/dL   Calcium 8.8 (L) 8.9 - 10.3 mg/dL   Total Protein 6.9 6.5 - 8.1 g/dL   Albumin 3.8 3.5 - 5.0 g/dL   AST 32 15 - 41 U/L   ALT 40 0 - 44 U/L   Alkaline Phosphatase 74 38 - 126 U/L   Total Bilirubin 0.6 0.3 - 1.2 mg/dL   GFR, Estimated 57 (L) >60 mL/min    Comment: (NOTE) Calculated using the CKD-EPI Creatinine Equation (2021)    Anion gap 6 5 - 15    Comment: Performed at [facility], [address] [place], [place] [address]  CBC with Differential ([place] Only)     Status: Abnormal   Collection Time: 05/29/22  9:43 AM  Result Value Ref Range   WBC Count 4.7 4.0 - 10.5 K/uL   RBC 3.36 (L) 4.22 - 5.81 MIL/uL   Hemoglobin 11.0 (L) 13.0 - 17.0 g/dL   HCT 32.2 (L) 39.0 - 52.0 %   MCV 95.8 80.0 - 100.0 fL   MCH 32.7 26.0 - 34.0 pg   MCHC 34.2 30.0 - 36.0 g/dL   RDW 15.6 (H) 11.5 - 15.5 %   Platelet Count 136 (L) 150 - 400 K/uL   nRBC 0.0 0.0 - 0.2 %   Neutrophils Relative % 75 %   Neutro Abs 3.5 1.7 - 7.7 K/uL   Lymphocytes Relative 6 %   Lymphs Abs 0.3 (L) 0.7 - 4.0 K/uL    Monocytes Relative 15 %   Monocytes Absolute 0.7 0.1 - 1.0 K/uL   Eosinophils Relative 2 %   Eosinophils Absolute 0.1 0.0 - 0.5 K/uL   Basophils Relative 0 %   Basophils Absolute 0.0 0.0 - 0.1 K/uL   Immature Granulocytes 2 %   Abs Immature Granulocytes 0.08 (H) 0.00 - 0.07 K/uL    Comment: Performed at [facility], [address] [place], [place] [address]  Iron and TIBC     Status: None   Collection Time: 06/07/22  8:49 AM  Result Value Ref Range   Iron 116 45 - 182 ug/dL   TIBC 379 250 - 450 ug/dL   Saturation Ratios 31 17.9 - 39.5 %   UIBC 263 ug/dL    Comment: Performed at [facility], [address] [place], [place] [address]  Ferritin     Status: None   Collection Time: 06/07/22  8:49 AM  Result Value Ref Range   Ferritin 180 24 - 336 ng/mL    Comment: Performed at [facility], [address], [place], [place] [address]  CMP ([place] only)     Status: Abnormal   Collection  Time: 06/07/22  8:49 AM  Result Value Ref Range   Sodium 135 135 - 145 mmol/L   Potassium 4.6 3.5 - 5.1 mmol/L   Chloride 110 98 - 111 mmol/L   CO2 20 (L) 22 - 32 mmol/L   Glucose, Bld 171 (H) 70 - 99 mg/dL    Comment: Glucose reference range applies only to samples taken after fasting for at least 8 hours.   BUN 40 (H) 8 - 23 mg/dL   Creatinine 1.48 (H) 0.61 - 1.24 mg/dL   Calcium 9.0 8.9 - 10.3 mg/dL   Total Protein 6.9 6.5 - 8.1 g/dL   Albumin 3.9 3.5 - 5.0 g/dL   AST 35 15 - 41 U/L   ALT 55 (H) 0 - 44 U/L   Alkaline Phosphatase 79 38 - 126 U/L   Total Bilirubin 0.5 0.3 - 1.2 mg/dL   GFR, Estimated 53 (L) >60 mL/min    Comment: (NOTE) Calculated using the CKD-EPI Creatinine Equation (2021)    Anion gap 5 5 - 15    Comment: Performed at [facility], [address] [place], [place] [address]  CBC with Differential ([place] Only)     Status: Abnormal   Collection Time: 06/07/22  8:49 AM  Result Value Ref Range   WBC Count 3.2 (L) 4.0 - 10.5  K/uL   RBC 2.96 (L) 4.22 - 5.81 MIL/uL   Hemoglobin 9.8 (L) 13.0 - 17.0 g/dL   HCT 28.9 (L) 39.0 - 52.0 %   MCV 97.6 80.0 - 100.0 fL   MCH 33.1 26.0 - 34.0 pg   MCHC 33.9 30.0 - 36.0 g/dL   RDW 14.7 11.5 - 15.5 %   Platelet Count 162 150 - 400 K/uL   nRBC 0.0 0.0 - 0.2 %   Neutrophils Relative % 79 %   Neutro Abs 2.6 1.7 - 7.7 K/uL   Lymphocytes Relative 11 %   Lymphs Abs 0.3 (L) 0.7 - 4.0 K/uL   Monocytes Relative 7 %   Monocytes Absolute 0.2 0.1 - 1.0 K/uL   Eosinophils Relative 2 %   Eosinophils Absolute 0.1 0.0 - 0.5 K/uL   Basophils Relative 0 %   Basophils Absolute 0.0 0.0 - 0.1 K/uL   Immature Granulocytes 1 %   Abs Immature Granulocytes 0.03 0.00 - 0.07 K/uL    Comment: Performed at [facility], [address] [place],  [address]  CMP ([place] only)     Status: Abnormal   Collection Time: 06/14/22  9:15 AM  Result Value Ref Range   Sodium 135 135 - 145 mmol/L   Potassium 4.6 3.5 - 5.1 mmol/L   Chloride 110 98 - 111 mmol/L   CO2 19 (L) 22 - 32 mmol/L   Glucose, Bld 149 (H) 70 - 99 mg/dL    Comment: Glucose reference range applies only to samples taken after fasting for at least 8 hours.   BUN 47 (H) 8 - 23 mg/dL   Creatinine 1.80 (H) 0.61 - 1.24 mg/dL   Calcium 8.8 (L) 8.9 - 10.3 mg/dL   Total Protein 6.8 6.5 - 8.1 g/dL   Albumin 3.9 3.5 - 5.0 g/dL   AST 34 15 - 41 U/L   ALT 45 (H) 0 - 44 U/L   Alkaline Phosphatase 74 38 - 126 U/L   Total Bilirubin 1.0 0.3 - 1.2 mg/dL   GFR, Estimated 42 (L) >60 mL/min    Comment: (NOTE) Calculated using the CKD-EPI Creatinine  Equation (2021)    Anion gap 6 5 - 15    Comment: Performed at [facility], [address] [place], [place] [address]  CBC with Differential ([place] Only)     Status: Abnormal   Collection Time: 06/14/22  9:15 AM  Result Value Ref Range   WBC Count 6.4 4.0 - 10.5 K/uL   RBC 3.12 (L) 4.22 - 5.81 MIL/uL   Hemoglobin 10.3 (L) 13.0 - 17.0 g/dL   HCT 31.2 (L) 39.0 -  52.0 %   MCV 100.0 80.0 - 100.0 fL   MCH 33.0 26.0 - 34.0 pg   MCHC 33.0 30.0 - 36.0 g/dL   RDW 16.6 (H) 11.5 - 15.5 %   Platelet Count 172 150 - 400 K/uL   nRBC 0.5 (H) 0.0 - 0.2 %   Neutrophils Relative % 73 %   Neutro Abs 4.6 1.7 - 7.7 K/uL   Lymphocytes Relative 5 %   Lymphs Abs 0.4 (L) 0.7 - 4.0 K/uL   Monocytes Relative 16 %   Monocytes Absolute 1.0 0.1 - 1.0 K/uL   Eosinophils Relative 2 %   Eosinophils Absolute 0.1 0.0 - 0.5 K/uL   Basophils Relative 0 %   Basophils Absolute 0.0 0.0 - 0.1 K/uL   Immature Granulocytes 4 %   Abs Immature Granulocytes 0.28 (H) 0.00 - 0.07 K/uL    Comment: Performed at [facility], [address], [place],  [address]      Assessment & Plan:   Problem List Items Addressed This Visit     HTN (hypertension)   HLD (hyperlipidemia) - Primary   Relevant Orders   Lipid Profile   Obesity, Class III, BMI 40-49.9 (morbid obesity) ([place])   Other Visit Diagnoses     Prediabetes       Relevant Orders   Hemoglobin A1c      Adding some labs to have drawn at his appointment tomorrow.  Will send refills for any meds as needed.   Return in about 3 months (around 09/15/2022) for F/U.   Total time spent: 20 minutes  Mechele Claude, FNP  06/15/2022

## 2022-06-16 ENCOUNTER — Inpatient Hospital Stay: Payer: 59

## 2022-06-16 DIAGNOSIS — B3749 Other urogenital candidiasis: Secondary | ICD-10-CM | POA: Diagnosis not present

## 2022-06-16 DIAGNOSIS — D631 Anemia in chronic kidney disease: Secondary | ICD-10-CM | POA: Diagnosis not present

## 2022-06-16 DIAGNOSIS — L03314 Cellulitis of groin: Secondary | ICD-10-CM | POA: Diagnosis not present

## 2022-06-16 DIAGNOSIS — R7303 Prediabetes: Secondary | ICD-10-CM

## 2022-06-16 DIAGNOSIS — I252 Old myocardial infarction: Secondary | ICD-10-CM | POA: Diagnosis not present

## 2022-06-16 DIAGNOSIS — C221 Intrahepatic bile duct carcinoma: Secondary | ICD-10-CM | POA: Diagnosis not present

## 2022-06-16 DIAGNOSIS — R569 Unspecified convulsions: Secondary | ICD-10-CM | POA: Diagnosis not present

## 2022-06-16 DIAGNOSIS — N1831 Chronic kidney disease, stage 3a: Secondary | ICD-10-CM | POA: Diagnosis not present

## 2022-06-16 DIAGNOSIS — Z79899 Other long term (current) drug therapy: Secondary | ICD-10-CM | POA: Diagnosis not present

## 2022-06-16 DIAGNOSIS — Z85820 Personal history of malignant melanoma of skin: Secondary | ICD-10-CM | POA: Diagnosis not present

## 2022-06-16 DIAGNOSIS — K760 Fatty (change of) liver, not elsewhere classified: Secondary | ICD-10-CM | POA: Diagnosis not present

## 2022-06-16 DIAGNOSIS — E782 Mixed hyperlipidemia: Secondary | ICD-10-CM

## 2022-06-16 DIAGNOSIS — Z452 Encounter for adjustment and management of vascular access device: Secondary | ICD-10-CM | POA: Diagnosis not present

## 2022-06-16 DIAGNOSIS — E785 Hyperlipidemia, unspecified: Secondary | ICD-10-CM | POA: Diagnosis not present

## 2022-06-16 LAB — BASIC METABOLIC PANEL - CANCER CENTER ONLY
Anion gap: 6 (ref 5–15)
BUN: 46 mg/dL — ABNORMAL HIGH (ref 8–23)
CO2: 18 mmol/L — ABNORMAL LOW (ref 22–32)
Calcium: 8.8 mg/dL — ABNORMAL LOW (ref 8.9–10.3)
Chloride: 110 mmol/L (ref 98–111)
Creatinine: 1.49 mg/dL — ABNORMAL HIGH (ref 0.61–1.24)
GFR, Estimated: 53 mL/min — ABNORMAL LOW (ref >60)
Glucose, Bld: 120 mg/dL — ABNORMAL HIGH (ref 70–99)
Potassium: 5.2 mmol/L — ABNORMAL HIGH (ref 3.5–5.1)
Sodium: 134 mmol/L — ABNORMAL LOW (ref 135–145)

## 2022-06-16 LAB — LIPID PANEL
Cholesterol: 140 mg/dL (ref 0–200)
HDL: 42 mg/dL (ref >40)
LDL Cholesterol: 73 mg/dL (ref 0–99)
Total CHOL/HDL Ratio: 3.3 RATIO
Triglycerides: 123 mg/dL (ref <150)
VLDL: 25 mg/dL (ref 0–40)

## 2022-06-19 LAB — HEMOGLOBIN A1C
Hgb A1c MFr Bld: 5.8 % — ABNORMAL HIGH (ref 4.8–5.6)
Mean Plasma Glucose: 120 mg/dL

## 2022-06-22 ENCOUNTER — Encounter: Payer: Self-pay | Admitting: Family

## 2022-06-22 ENCOUNTER — Inpatient Hospital Stay: Payer: 59

## 2022-06-27 ENCOUNTER — Other Ambulatory Visit (HOSPITAL_COMMUNITY): Payer: Self-pay

## 2022-06-27 ENCOUNTER — Other Ambulatory Visit: Payer: Self-pay

## 2022-06-28 DIAGNOSIS — I214 Non-ST elevation (NSTEMI) myocardial infarction: Secondary | ICD-10-CM | POA: Diagnosis not present

## 2022-06-28 DIAGNOSIS — I2510 Atherosclerotic heart disease of native coronary artery without angina pectoris: Secondary | ICD-10-CM | POA: Diagnosis not present

## 2022-06-28 DIAGNOSIS — Z950 Presence of cardiac pacemaker: Secondary | ICD-10-CM | POA: Diagnosis not present

## 2022-06-28 DIAGNOSIS — R160 Hepatomegaly, not elsewhere classified: Secondary | ICD-10-CM | POA: Diagnosis not present

## 2022-06-28 DIAGNOSIS — I10 Essential (primary) hypertension: Secondary | ICD-10-CM | POA: Diagnosis not present

## 2022-06-28 DIAGNOSIS — I442 Atrioventricular block, complete: Secondary | ICD-10-CM | POA: Diagnosis not present

## 2022-06-28 DIAGNOSIS — E782 Mixed hyperlipidemia: Secondary | ICD-10-CM | POA: Diagnosis not present

## 2022-06-28 DIAGNOSIS — N179 Acute kidney failure, unspecified: Secondary | ICD-10-CM | POA: Diagnosis not present

## 2022-06-28 DIAGNOSIS — I447 Left bundle-branch block, unspecified: Secondary | ICD-10-CM | POA: Diagnosis not present

## 2022-06-28 DIAGNOSIS — N1831 Chronic kidney disease, stage 3a: Secondary | ICD-10-CM | POA: Diagnosis not present

## 2022-06-28 DIAGNOSIS — Z951 Presence of aortocoronary bypass graft: Secondary | ICD-10-CM | POA: Diagnosis not present

## 2022-06-29 ENCOUNTER — Other Ambulatory Visit: Payer: Self-pay | Admitting: *Deleted

## 2022-06-29 ENCOUNTER — Other Ambulatory Visit: Payer: Self-pay | Admitting: Oncology

## 2022-06-29 ENCOUNTER — Other Ambulatory Visit (HOSPITAL_COMMUNITY): Payer: Self-pay

## 2022-06-29 DIAGNOSIS — C221 Intrahepatic bile duct carcinoma: Secondary | ICD-10-CM

## 2022-06-29 MED ORDER — CAPECITABINE 500 MG PO TABS
2000.0000 mg | ORAL_TABLET | Freq: Two times a day (BID) | ORAL | 0 refills | Status: DC
  Filled 2022-06-29: qty 112, 14d supply, fill #0
  Filled 2022-06-30: qty 112, 21d supply, fill #0

## 2022-06-29 NOTE — Telephone Encounter (Signed)
CBC with Differential ([facility] Only) Order: [id number] Status: Final result     Visible to patient: Yes (seen)     Next appt: 07/10/2022 at 09:30 AM in Oncology ([facility])     Dx: Cholangiocarcinoma   0 Result Notes          Component Ref Range & Units 2 wk ago (06/14/22) 3 wk ago (06/07/22) 1 mo ago (05/29/22) 1 mo ago (05/16/22) 1 mo ago (05/03/22) 2 mo ago (04/20/22) 2 mo ago (04/10/22)  WBC Count 4.0 - 10.5 K/uL 6.4 3.2 Low  4.7 3.9 Low  3.4 Low  3.2 Low  4.1  RBC 4.22 - 5.81 MIL/uL 3.12 Low  2.96 Low  3.36 Low  3.38 Low  3.59 Low  3.64 Low  3.72 Low   Hemoglobin 13.0 - 17.0 g/dL 10.3 Low  9.8 Low  11.0 Low  10.8 Low  11.3 Low  11.4 Low  11.3 Low   HCT 39.0 - 52.0 % 31.2 Low  28.9 Low  32.2 Low  31.9 Low  33.9 Low  35.3 Low  34.1 Low   MCV 80.0 - 100.0 fL 100.0 97.6 95.8 94.4 94.4 97.0 91.7  MCH 26.0 - 34.0 pg 33.0 33.1 32.7 32.0 31.5 31.3 30.4  MCHC 30.0 - 36.0 g/dL 33.0 33.9 34.2 33.9 33.3 32.3 33.1  RDW 11.5 - 15.5 % 16.6 High  14.7 15.6 High  14.6 15.2 14.6 13.6  Platelet Count 150 - 400 K/uL 172 162 136 Low  162 142 Low  143 Low  149 Low   nRBC 0.0 - 0.2 % 0.5 High  0.0 0.0 0.0 0.0 0.0 0.0  Neutrophils Relative % % 73 79 75 76 72 75 75  Neutro Abs 1.7 - 7.7 K/uL 4.6 2.6 3.5 2.9 2.5 2.4 3.1  Lymphocytes Relative % 5 11 6 8 9 9 10   Lymphs Abs 0.7 - 4.0 K/uL 0.4 Low  0.3 Low  0.3 Low  0.3 Low  0.3 Low  0.3 Low  0.4 Low   Monocytes Relative % 16 7 15 13 13 12 11   Monocytes Absolute 0.1 - 1.0 K/uL 1.0 0.2 0.7 0.5 0.4 0.4 0.5  Eosinophils Relative % 2 2 2 2 3 3 3   Eosinophils Absolute 0.0 - 0.5 K/uL 0.1 0.1 0.1 0.1 0.1 0.1 0.1  Basophils Relative % 0 0 0 0 1 0 0  Basophils Absolute 0.0 - 0.1 K/uL 0.0 0.0 0.0 0.0 0.0 0.0 0.0  Immature Granulocytes % 4 1 2 1 2 1 1   Abs Immature Granulocytes 0.00 - 0.07 K/uL 0.28 High  0.03 CM 0.08 High  CM 0.04 CM 0.05 CM 0.03 CM 0.03 CM  Comment: Performed at [facility], [address] [place], [place]  [address]  Resulting Agency [facility] [place] [facility] [place] [facility] [place] [facility] [place] [facility] [place] [facility] [place] [facility]         Specimen Collected: 06/14/22 09:15 Last Resulted: 06/14/22 09:35      Basic Metabolic Panel - [place] Only Order: [id number] Status: Final result     Visible to patient: Yes (seen)     Next appt: 07/10/2022 at 09:30 AM in Oncology ([facility])     Dx: Cholangiocarcinoma   0 Result Notes     1 Patient Communication          Component Ref Range & Units 13 d ago (06/16/22) 2 wk ago (06/14/22) 3 wk ago (06/07/22) 1 mo  ago (05/29/22) 1 mo ago (05/16/22) 1 mo ago (05/03/22) 2 mo ago (04/20/22)  Sodium 135 - 145 mmol/L 134 Low  135 135 135 136 136 139  Potassium 3.5 - 5.1 mmol/L 5.2 High  4.6 4.6 5.1 5.4 High  4.5 4.6  Chloride 98 - 111 mmol/L 110 110 110 111 109 107 110  CO2 22 - 32 mmol/L 18 Low  19 Low  20 Low  18 Low  20 Low  21 Low  22  Glucose, Bld 70 - 99 mg/dL 120 High  149 High  CM 171 High  CM 110 High  CM 106 High  CM 156 High  CM 138 High  CM  Comment: Glucose reference range applies only to samples taken after fasting for at least 8 hours.  BUN 8 - 23 mg/dL 46 High  47 High  40 High  41 High  39 High  36 High  36 High   Creatinine 0.61 - 1.24 mg/dL 1.49 High  1.80 High  1.48 High  1.39 High  1.44 High  1.41 High  1.42 High   Calcium 8.9 - 10.3 mg/dL 8.8 Low  8.8 Low  9.0 8.8 Low  9.1 8.9 9.3  GFR, Estimated >60 mL/min 53 Low  42 Low  CM 53 Low  CM      Comment: (NOTE) Calculated using the CKD-EPI Creatinine Equation (2021)  Anion gap 5 - 15 6 6  CM 5 CM 6 CM 7 CM 8 CM 7 CM  Comment: Performed at [facility], [address] [place], [place] [address]  Resulting Agency [facility] [place] [facility] [place] [facility] [place] [facility] [place] [facility] [place] [facility] [place] [facility]         Specimen Collected: 06/16/22 09:19 Last Resulted: 06/16/22 09:45

## 2022-06-30 ENCOUNTER — Encounter: Payer: Self-pay | Admitting: Oncology

## 2022-06-30 ENCOUNTER — Other Ambulatory Visit: Payer: Self-pay

## 2022-06-30 ENCOUNTER — Other Ambulatory Visit (HOSPITAL_COMMUNITY): Payer: Self-pay

## 2022-07-10 ENCOUNTER — Inpatient Hospital Stay (HOSPITAL_BASED_OUTPATIENT_CLINIC_OR_DEPARTMENT_OTHER): Payer: 59 | Admitting: Oncology

## 2022-07-10 ENCOUNTER — Inpatient Hospital Stay: Payer: 59 | Attending: Oncology

## 2022-07-10 ENCOUNTER — Encounter: Payer: Self-pay | Admitting: Oncology

## 2022-07-10 VITALS — BP 141/59 | HR 84 | Temp 96.8°F | Wt 251.1 lb

## 2022-07-10 DIAGNOSIS — R21 Rash and other nonspecific skin eruption: Secondary | ICD-10-CM | POA: Diagnosis not present

## 2022-07-10 DIAGNOSIS — D631 Anemia in chronic kidney disease: Secondary | ICD-10-CM | POA: Diagnosis not present

## 2022-07-10 DIAGNOSIS — Z5111 Encounter for antineoplastic chemotherapy: Secondary | ICD-10-CM | POA: Diagnosis not present

## 2022-07-10 DIAGNOSIS — N1832 Chronic kidney disease, stage 3b: Secondary | ICD-10-CM | POA: Diagnosis not present

## 2022-07-10 DIAGNOSIS — D701 Agranulocytosis secondary to cancer chemotherapy: Secondary | ICD-10-CM | POA: Insufficient documentation

## 2022-07-10 DIAGNOSIS — C221 Intrahepatic bile duct carcinoma: Secondary | ICD-10-CM | POA: Insufficient documentation

## 2022-07-10 DIAGNOSIS — N1831 Chronic kidney disease, stage 3a: Secondary | ICD-10-CM | POA: Diagnosis not present

## 2022-07-10 DIAGNOSIS — C438 Malignant melanoma of overlapping sites of skin: Secondary | ICD-10-CM | POA: Diagnosis not present

## 2022-07-10 DIAGNOSIS — Z85820 Personal history of malignant melanoma of skin: Secondary | ICD-10-CM | POA: Insufficient documentation

## 2022-07-10 LAB — CMP (CANCER CENTER ONLY)
ALT: 46 U/L — ABNORMAL HIGH (ref 0–44)
AST: 35 U/L (ref 15–41)
Albumin: 3.9 g/dL (ref 3.5–5.0)
Alkaline Phosphatase: 70 U/L (ref 38–126)
Anion gap: 5 (ref 5–15)
BUN: 35 mg/dL — ABNORMAL HIGH (ref 8–23)
CO2: 22 mmol/L (ref 22–32)
Calcium: 9.4 mg/dL (ref 8.9–10.3)
Chloride: 111 mmol/L (ref 98–111)
Creatinine: 1.48 mg/dL — ABNORMAL HIGH (ref 0.61–1.24)
GFR, Estimated: 53 mL/min — ABNORMAL LOW (ref >60)
Glucose, Bld: 131 mg/dL — ABNORMAL HIGH (ref 70–99)
Potassium: 5.3 mmol/L — ABNORMAL HIGH (ref 3.5–5.1)
Sodium: 138 mmol/L (ref 135–145)
Total Bilirubin: 0.7 mg/dL (ref 0.3–1.2)
Total Protein: 6.8 g/dL (ref 6.5–8.1)

## 2022-07-10 LAB — CBC WITH DIFFERENTIAL (CANCER CENTER ONLY)
Abs Immature Granulocytes: 0.06 10*3/uL (ref 0.00–0.07)
Basophils Absolute: 0 10*3/uL (ref 0.0–0.1)
Basophils Relative: 1 %
Eosinophils Absolute: 0.1 10*3/uL (ref 0.0–0.5)
Eosinophils Relative: 3 %
HCT: 28 % — ABNORMAL LOW (ref 39.0–52.0)
Hemoglobin: 9.3 g/dL — ABNORMAL LOW (ref 13.0–17.0)
Immature Granulocytes: 1 %
Lymphocytes Relative: 8 %
Lymphs Abs: 0.3 10*3/uL — ABNORMAL LOW (ref 0.7–4.0)
MCH: 34.4 pg — ABNORMAL HIGH (ref 26.0–34.0)
MCHC: 33.2 g/dL (ref 30.0–36.0)
MCV: 103.7 fL — ABNORMAL HIGH (ref 80.0–100.0)
Monocytes Absolute: 0.7 10*3/uL (ref 0.1–1.0)
Monocytes Relative: 16 %
Neutro Abs: 3.1 10*3/uL (ref 1.7–7.7)
Neutrophils Relative %: 71 %
Platelet Count: 179 10*3/uL (ref 150–400)
RBC: 2.7 MIL/uL — ABNORMAL LOW (ref 4.22–5.81)
RDW: 17.2 % — ABNORMAL HIGH (ref 11.5–15.5)
WBC Count: 4.4 10*3/uL (ref 4.0–10.5)
nRBC: 0.5 % — ABNORMAL HIGH (ref 0.0–0.2)

## 2022-07-10 NOTE — Assessment & Plan Note (Addendum)
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  S/p chemotherapy Xeloda 825mg /m2 BID+ concurrent RT Previously on adjuvant Xeloda [1000 mg/m2 BID for 14 of every 21 days] -dose reduced to 825mg /m2 BID due to skin rash, still not able to tolerate- discontinue.  Recommend to switch to gemcitabine weekly for 3 weeks every 4 weeks.  Rationale and side effects were reviewed with patient. He agrees with the plan.  Labs are reviewed and discussed with patient.  Will proceed with cycle 1 D1 gemcitabine on 07/12/22

## 2022-07-10 NOTE — Assessment & Plan Note (Signed)
Chemotherapy plan as listed above 

## 2022-07-10 NOTE — Assessment & Plan Note (Signed)
Patient has no radiographic evidence of metastatic melanoma on recent CT scan done at [facility] immunotherapy. Continue surveillance 

## 2022-07-10 NOTE — Assessment & Plan Note (Signed)
Bilateral facial and dorsum of hands,  Improved after course of steroids.  Recommend patient continue topical steroids patient to rash on his hands.   

## 2022-07-10 NOTE — Progress Notes (Signed)
Hematology/Oncology Progress note Telephone:([phone]) [phone] Fax:([phone]) [phone]    CHIEF COMPLAINTS/REASON FOR VISIT:  Follow up for melanoma, cholangiocarcinoma.    ASSESSMENT & PLAN:   Cancer Staging  Cholangiocarcinoma Staging form: Intrahepatic Bile Duct, AJCC 8th Edition - Pathologic stage from 01/25/2022: Stage Unknown (pT1a, pNX, cM0) - Signed by Rickard Patience, MD on 02/21/2022  Malignant melanoma of overlapping sites Staging form: Melanoma of the Skin, AJCC 8th Edition - Pathologic: Stage Unknown (rpTX, pN1b, cM0) - Signed by Rickard Patience, MD on 07/27/2020 - Pathologic: No stage assigned - Unsigned   Cholangiocarcinoma T1a Nx Cholangiocarcinoma Pathology was reviewed and discussed with patient.  S/p chemotherapy Xeloda 825mg /m2 BID+ concurrent RT Previously on adjuvant Xeloda [1000 mg/m2 BID for 14 of every 21 days] -dose reduced to 825mg /m2 BID due to skin rash, still not able to tolerate- discontinue.  Recommend to switch to gemcitabine weekly for 3 weeks every 4 weeks.  Rationale and side effects were reviewed with patient. He agrees with the plan.  Labs are reviewed and discussed with patient.  Will proceed with cycle 1 D1 gemcitabine on 07/12/22    Malignant melanoma of overlapping sites [facility]) Patient has no radiographic evidence of metastatic melanoma on recent CT scan done at [facility] immunotherapy. Continue surveillance  Anemia in chronic kidney disease Hemoglobin is stable.  Continue to monitor    Stage 3a chronic kidney disease (HCC) Encourage oral hydration and avoid nephrotoxins.  Recommend patient to make a follow up appt with nephrology. K is trending higher    Encounter for antineoplastic chemotherapy Chemotherapy plan as listed above.   Skin rash Bilateral facial and dorsum of hands,  Improved after course of steroids.  Recommend patient continue topical steroids patient to rash on his hands.    Follow-up  3 weeks All questions were answered. The  patient knows to call the clinic with any problems, questions or concerns.  Rickard Patience, MD, PhD [facility] Hematology Oncology 07/10/2022    HISTORY OF PRESENTING ILLNESS:   Edward Pitts is a  62 y.o.  male presents for recurrent malignant melanoma.   Oncology History  Malignant melanoma of overlapping sites  04/16/2019 Cancer Staging   Staging form: Melanoma of the Skin, AJCC 8th Edition - Pathologic: Stage Unknown (rpTX, pN1b, cM0) - Signed by Rickard Patience, MD on 07/27/2020 Stage prefix: Recurrence    04/23/2019 Initial Diagnosis   Malignant melanoma   -He has a history of left lower extremity melanoma in 2011, status post local excision -04/16/2019 patient underwent left groin mass resection  Resection pathology showed malignant melanoma, replacing a lymph node, with extracapsular extension, peripheral and deep margins involved.  Left inguinal contents, all 7 lymph nodes were negative for melanoma in the lymph nodes. Extranodal melanoma identified in lymphatic and interstitium between nodes -PDL1 80% TPS    07/07/2019 -  Radiation Therapy   status post adjuvant radiation.   07/23/2019 - 07/21/2021 Chemotherapy   Nivolumab q14d      06/29/2020 Imaging   CT chest abdomen pelvis showed stable postoperative appearance of the left groin.  No evidence of local recurrence.  No evidence of metastatic disease in the chest abdomen or pelvis.  Hepatic steatosis.  Stable subcentimeter fluid attenuation lesion of the lateral right lobe of the liver, likely benign cyst or hemangioma.  Coronary artery disease.  Aortic atherosclerosis   10/20/2020 Imaging   CT chest abdomen pelvis showed stable postoperative/radiation appearance of the left groin.  No evidence of local recurrence/metastatic disease within the  chest abdomen/pelvis.  Fatty liver disease.  Diverticulosis without evidence of typhlitis.  Aortic atherosclerosis   03/10/2021 Imaging   MRI brain without contrast showed no definitive evidence of  intracranial metastatic disease.  Study is limited by absence of intravenous contrast.     04/26/2021 Imaging   CT chest abdomen pelvis without contrast showed stable post operative changes of left groin with no evidence of recurrent disease.  No evidence of metastatic disease in the chest abdomen pelvis.  Aortic atherosclerosis   08/08/2021 - 08/16/2021 Hospital Admission    patient was hospitalized due to NSTEMI status post CABG x3.  He also had pacemaker The echocardiogram showed left ventricular ejection fraction of 65 to 70%,   09/15/2021 Imaging   CT chest abdomen pelvis w contrast  IMPRESSION: 1. Subtle hypodense 9 mm lesion in the left lobe of the liver is new from prior imaging including previous contrasted CT dating back to December 10, 2019, with the lesion appearing to equilibrate with background liver on delayed imaging sequence but is incompletely evaluated on this imaging study and technically nonspecific possibly reflecting a benign perfusional variant and while its appearance is not typical for that of a melanoma metastasis, it is not excluded on this examination. Suggest more definitive characterization by hepatic protocol MRI with and without contrast. 2. Stable postoperative changes in the left groin without evidence of local recurrent disease.3. No evidence of metastatic disease in the chest or pelvis.4.  Aortic Atherosclerosis (ICD10-I70.0).      09/23/2021 Imaging   Contrast-enhanced liver ultrasound Mildly hypoenhancing 2.2 cm mass in the posterior aspect of the left lobe of the liver with washout characteristics concerning for non hepatocellular malignancy, concerning for melanotic metastasis given history. The lesion is in an unfavorable location for percutaneous biopsy. Consider PET-CT for further characterization   10/21/2021 -  Chemotherapy   Nivolumab q14d      11/10/2021 Imaging   MRI Brain w wo contrast Negative for metastatic disease to the brain   11/10/2021  Imaging   MRI abdomen w wo contrast 1. Lesion of the posterior superior left lobe of the liver, hepatic segment II, measuring 1.9 x 1.8 cm corresponding to findings of prior imaging. Evaluation is somewhat limited breath motion artifact however there is subtle associated rim enhancement of this lesion. Findings are most in keeping with a hepatic metastasis in the setting of known recurrent melanoma. 2. Mild hepatic steatosis. 3. Cardiomegaly.    02/27/2022 - 02/27/2022 Chemotherapy   Patient is on Treatment Plan : Capecitabine (825 mg/m2 bid) + XRT     07/12/2022 -  Chemotherapy   Patient is on Treatment Plan : PANCREAS Gemcitabine D1,8,15 (1000) q28d x 1 Cycle     Cholangiocarcinoma  12/19/2021 Initial Diagnosis   Liver biopsy showed carcinoma  -The slides on the patient's prior left inguinal lymph node biopsy ([id number]) with metastatic melanoma were reviewed in conjunction with  this case. The morphology of the malignant cells between the two cases are dissimilar. A limited panel of immunohistochemical stains was performed and the neoplastic cells are positive for superpancytokeratin, cytokeratin 7 (diffuse, strong), cytokeratin 20 (patchy, moderate) and negative for S100, SOX-10, and HepPar-1.These findings are consistent with carcinoma. The morphologic findings and pattern of immunohistochemical staining are non-specific and possible sites of origin include but are not limited to pancreaticobiliary tract, GI tract, prostate, kidney, lung, and breast. Per [facility], the patient had a PET scan performed in July 2023 which demonstrated a single hepatic  hypermetabolic lesion without  other sites of disease.    01/19/2022 Surgery   Liver lesion resection at [facility] by Dr.Zani  A.  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  Tumor extends to hepatic parenchymal margin, but all other margins are negative for tumor. See synoptic report  and comment.   Background liver with: Mild steatosis (20%).  No definitive evidence of steatohepatitis.   Mild periportal fibrosis (trichrome and reticulin). No stainable iron (Prussian blue stain).  No evidence to support alpha-1-antitrypsin deficiency on PAS-D stain.   pT1a pNx   01/25/2022 Cancer Staging   Staging form: Intrahepatic Bile Duct, AJCC 8th Edition - Pathologic stage from 01/25/2022: Stage Unknown (pT1a, pNX, cM0) - Signed by Rickard Patience, MD on 02/21/2022 Stage prefix: Initial diagnosis   03/23/2022 - 05/01/2022 Radiation Therapy   Concurrent Xeloda /m2 BID + Radiation.    05/08/2022 -  Chemotherapy   Xeloda [1000 mg/m2 BID for 14 of every 21 days], plan 4 months.   05/08/2022, cycle 1 Xeloda.  Xeloda was stopped on 05/19/22 due to developing rash on face as well as dorsum of hands. Treated with steroid,symptoms resolved.  3/4-3/13/2024  Xeloda was stopped early due to groin cellulitis, he also developed similar skin rash again  Plan 3/25 cycle 3 dose reduce Xeloda 825 mg/m2    BID x 14 days.   05/09/2022 Imaging   CT chest abdomen pelvis with contrast at [facility] showed Status post partial left hepatectomy without CT evidence of recurrent or metastatic disease in the chest, abdomen, pelvis.      04/18/2021, patient establish care with neurology Dr. Barbaraann Cao for intermittent altered cognition.  he was recommended to start Vimpat.    INTERVAL HISTORY Edward Pitts is a 62 y.o. male who has above history reviewed by me today presents for follow up visit for management of inguinal nodal recurrence of melanoma, resected T1a cholangiocarcinoma. He can not tolerate Xeloda at  BID, started the last cycle on 06/19/22, developed rash soon after start, also felt SOB while taking it. He stopped taking medication and SOB improved.     :Review of Systems  Constitutional:  Negative for appetite change, chills, fatigue, fever and unexpected weight change.  HENT:   Negative for  hearing loss and voice change.   Eyes:  Negative for eye problems and icterus.  Respiratory:  Negative for chest tightness, cough and shortness of breath.   Cardiovascular:  Positive for leg swelling. Negative for chest pain.  Gastrointestinal:  Negative for abdominal distention and abdominal pain.  Endocrine: Negative for hot flashes.  Genitourinary:  Negative for difficulty urinating, dysuria and frequency.   Musculoskeletal:        Status post left hip replacement  Skin:  Positive for rash. Negative for itching.       Skin hypo-pigmentation on upper extremities, no change Rash on his hands   Neurological:  Negative for light-headedness and numbness.       Spells  Hematological:  Negative for adenopathy. Does not bruise/bleed easily.  Psychiatric/Behavioral:  Negative for confusion.     MEDICAL HISTORY:  Past Medical History:  Diagnosis Date   Anemia    iron treatments   Anxiety    Aortic atherosclerosis    Arthritis    Cancer of groin 2021   left groin, resected, radiation   Cataract    Complication of anesthesia    PONV   Coronary artery disease    Dizziness of unknown etiology  has led to seizures and passing out.   Family history of adverse reaction to anesthesia    PONV mother   GERD (gastroesophageal reflux disease)    History of complete heart block    PPM placed   Hyperlipidemia    Hypertension    LBBB (left bundle branch block)    Lymphedema of left leg    uses thigh high compression stockings   Melanoma 2012   skin cancer, left thigh   OSA on CPAP    PONV (postoperative nausea and vomiting) 04/16/2019   Port-A-Cath in place    RIGHT chest wall   Presence of cardiac pacemaker    [employer]   Seizures    still has episodes of dizziness. last event 1 month ago (march 2022) and will pass out. takes clonazepam    SURGICAL HISTORY: Past Surgical History:  Procedure Laterality Date   CORONARY ARTERY BYPASS GRAFT N/A 08/10/2021   Procedure: CORONARY  ARTERY BYPASS GRAFTING (CABG) X 3 USING LEFT INTERNAL MAMMARY ARTERY AND RIGHT GREATER SAPHENOUS VEIN;  Surgeon: Lovett Sox, MD;  Location: [facility] OR;  Service: Open Heart Surgery;  Laterality: N/A;   CT RADIATION THERAPY GUIDE     left groin   dental implant     permanent implant   ENDOVEIN HARVEST OF GREATER SAPHENOUS VEIN Right 08/10/2021   Procedure: ENDOVEIN HARVEST OF GREATER SAPHENOUS VEIN;  Surgeon: Lovett Sox, MD;  Location: [facility] OR;  Service: Open Heart Surgery;  Laterality: Right;   KNEE SURGERY Left    arthroscopy   LEFT HEART CATH AND CORONARY ANGIOGRAPHY Left 06/29/2017   Procedure: LEFT HEART CATH AND CORONARY ANGIOGRAPHY;  Surgeon: Lamar Blinks, MD;  Location: [facility] INVASIVE CV LAB;  Service: Cardiovascular;  Laterality: Left;   LEFT HEART CATH AND CORONARY ANGIOGRAPHY N/A 08/08/2021   Procedure: LEFT HEART CATH AND CORONARY ANGIOGRAPHY;  Surgeon: Lamar Blinks, MD;  Location: [facility] INVASIVE CV LAB;  Service: Cardiovascular;  Laterality: N/A;   LYMPH NODE DISSECTION Left 04/16/2019   Procedure: Left inguinal Lymph Node Dissection;  Surgeon: Almond Lint, MD;  Location: [facility] OR;  Service: General;  Laterality: Left;   MELANOMA EXCISION Left 04/16/2019   Procedure: MELANOMA EXCISION LEFT GROIN MASS;  Surgeon: Almond Lint, MD;  Location: [facility] OR;  Service: General;  Laterality: Left;   MELANOMA EXCISION WITH SENTINEL LYMPH NODE BIOPSY Left 2012   Left calf    PACEMAKER INSERTION N/A 08/26/2018   Procedure: INSERTION PACEMAKER;  Surgeon: Marcina Millard, MD;  Location: [facility] ORS;  Service: Cardiovascular;  Laterality: N/A;   PORTA CATH INSERTION N/A 08/26/2019   Procedure: PORTA CATH INSERTION;  Surgeon: Renford Dills, MD;  Location: [facility] INVASIVE CV LAB;  Service: Cardiovascular;  Laterality: N/A;   SUPERFICIAL LYMPH NODE BIOPSY / EXCISION Left 2020   lymph nodes removed around left groin melanoma site   TEE WITHOUT CARDIOVERSION N/A 08/10/2021   Procedure:  TRANSESOPHAGEAL ECHOCARDIOGRAM (TEE);  Surgeon: Lovett Sox, MD;  Location: [facility] OR;  Service: Open Heart Surgery;  Laterality: N/A;   TEMPORARY PACEMAKER N/A 08/25/2018   Procedure: TEMPORARY PACEMAKER;  Surgeon: Tonny Bollman, MD;  Location: [facility] INVASIVE CV LAB;  Service: Cardiovascular;  Laterality: N/A;   TOTAL HIP ARTHROPLASTY Left 07/14/2020   Procedure: TOTAL HIP ARTHROPLASTY;  Surgeon: Donato Heinz, MD;  Location: [facility] ORS;  Service: Orthopedics;  Laterality: Left;    SOCIAL HISTORY: Social History   Socioeconomic History   Marital status: Married    Spouse name: [name]  Number of children: 7   Years of education: 12   Highest education level: Not on file  Occupational History    Comment: disability  Tobacco Use   Smoking status: Never   Smokeless tobacco: Never  Vaping Use   Vaping Use: Never used  Substance and Sexual Activity   Alcohol use: No   Drug use: No   Sexual activity: Not Currently  Other Topics Concern   Not on file  Social History Narrative   Lives with  Wife,   Has 2 small dogs   Caffeine use: sodas (2 per day)      Out of work on disability.  Has a walk in shower. No stairs to climb   Oncology treatment ongoing. Uses port a cath for treatment.      pacemaker   Social Determinants of Health   Financial Resource Strain: Low Risk  (02/12/2019)   Overall Financial Resource Strain (CARDIA)    Difficulty of Paying Living Expenses: Not hard at all  Food Insecurity: Food Insecurity Present (02/21/2022)   Hunger Vital Sign    Worried About Running Out of Food in the Last Year: Sometimes true    Ran Out of Food in the Last Year: Often true  Transportation Needs: No Transportation Needs (02/21/2022)   PRAPARE - Administrator, Civil Service (Medical): No    Lack of Transportation (Non-Medical): No  Physical Activity: Unknown (02/12/2019)   Exercise Vital Sign    Days of Exercise per Week: 0 days    Minutes of Exercise per Session:  Not on file  Stress: No Stress Concern Present (02/12/2019)   [employer] of Occupational Health - Occupational Stress Questionnaire    Feeling of Stress : Only a little  Social Connections: Unknown (02/12/2019)   Social Connection and Isolation Panel [NHANES]    Frequency of Communication with Friends and Family: More than three times a week    Frequency of Social Gatherings with Friends and Family: Not on file    Attends Religious Services: Not on file    Active Member of Clubs or Organizations: Not on file    Attends Banker Meetings: Not on file    Marital Status: Married  Intimate Partner Violence: Not At Risk (02/21/2022)   Humiliation, Afraid, Rape, and Kick questionnaire    Fear of Current or Ex-Partner: No    Emotionally Abused: No    Physically Abused: No    Sexually Abused: No    FAMILY HISTORY: Family History  Problem Relation Age of Onset   Cancer Paternal Grandmother     ALLERGIES:  is allergic to ibuprofen, levetiracetam, and nsaids.  MEDICATIONS:  Current Outpatient Medications  Medication Sig Dispense Refill   apixaban (ELIQUIS) 5 MG TABS tablet Take 5 mg by mouth 2 (two) times daily.     aspirin 81 MG EC tablet Take 81 mg by mouth daily.     atorvastatin (LIPITOR) 20 MG tablet Take 20 mg by mouth daily.     Carboxymeth-Glyc-Polysorb PF (REFRESH OPTIVE MEGA-3) 0.5-1-0.5 % SOLN Place 1 drop into both eyes daily as needed (dry eyes).     clonazePAM (KLONOPIN) 0.5 MG tablet TAKE 1 TABLET BY MOUTH IN THE MORNING AND 1 BY MOUTH IN THE EVENING per pt 120 tablet 1   clotrimazole (CLOTRIMAZOLE ANTI-FUNGAL) 1 % cream Apply 1 Application topically 2 (two) times daily. Apply to the L inguinal fold 30 g 0   febuxostat (ULORIC) 40 MG tablet Take 40  mg by mouth daily.     ferrous sulfate 325 (65 FE) MG EC tablet TAKE 1 TABLET BY MOUTH 2 TIMES DAILY WITH A MEAL. 180 tablet 1   hydrocortisone 2.5 % ointment Apply 1 application. topically daily as needed  (itching).     Lacosamide 100 MG TABS Take 1 tablet (100 mg total) by mouth in the morning and at bedtime. 60 tablet 3   lidocaine-prilocaine (EMLA) cream Apply 1 application. topically as needed. Apply small amount of cream to port site approx 1-2 hours prior to appointment. 30 g 11   lisinopril (ZESTRIL) 20 MG tablet Take 1 tablet by mouth daily.     metoprolol succinate (TOPROL-XL) 25 MG 24 hr tablet Take 25 mg by mouth daily.     Multiple Vitamin (MULTIVITAMIN WITH MINERALS) TABS tablet Take 1 tablet by mouth daily. Centrum Silver     nystatin (MYCOSTATIN/NYSTOP) powder Apply 1 Application topically 3 (three) times daily. 60 g 1   omeprazole (PRILOSEC) 20 MG capsule TAKE 1 CAPSULE BY MOUTH EVERY DAY 90 capsule 0   ondansetron (ZOFRAN) 4 MG tablet TAKE 1 TABLET BY MOUTH EVERY 8 HOURS AS NEEDED FOR NAUSEA AND VOMITING 90 tablet 1   traZODone (DESYREL) 50 MG tablet TAKE 1 TABLET BY MOUTH EVERY DAY AT BEDTIME AS NEEDED FOR SLEEP 90 tablet 1   zinc oxide 20 % ointment Apply 1 Application topically as needed for irritation. 56.7 g 0   capecitabine (XELODA) 500 MG tablet Take 4 tablets (2,000 mg total) by mouth 2 (two) times daily after a meal. Take for 14 days, then hold for 7 days. Repeat every 21 days. (Patient not taking: Reported on 07/10/2022) 112 tablet 0   predniSONE (STERAPRED UNI-PAK 21 TAB) 10 MG (21) TBPK tablet Day 1 take 6 tablets, Day 2 take 5 tablets, Day 3 take 4 tablets, Day 4 take 3 tablets, Day 5 take 2 tablets Day 6 take 1 tablet (Patient not taking: Reported on 06/14/2022) 1 each 0   No current facility-administered medications for this visit.   Facility-Administered Medications Ordered in Other Visits  Medication Dose Route Frequency Provider Last Rate Last Admin   heparin lock flush 100 UNIT/ML injection            heparin lock flush 100 UNIT/ML injection              PHYSICAL EXAMINATION: ECOG PERFORMANCE STATUS: 1 - Symptomatic but completely ambulatory Vitals:    07/10/22 0951  BP: (!) 141/59  Pulse: 84  Temp: (!) 96.8 F (36 C)  SpO2: 99%   Filed Weights   07/10/22 0951  Weight: 251 lb 1.6 oz (113.9 kg)    Physical Exam Constitutional:      General: He is not in acute distress.    Comments: Patient ambulates independently  HENT:     Head: Normocephalic and atraumatic.  Eyes:     General: No scleral icterus.    Pupils: Pupils are equal, round, and reactive to light.  Cardiovascular:     Rate and Rhythm: Normal rate and regular rhythm.     Heart sounds: Normal heart sounds.  Pulmonary:     Effort: Pulmonary effort is normal. No respiratory distress.     Breath sounds: No wheezing.  Abdominal:     General: Bowel sounds are normal. There is no distension.     Palpations: Abdomen is soft. There is no mass.     Tenderness: There is no abdominal tenderness.  Comments:    Musculoskeletal:        General: No deformity. Normal range of motion.     Cervical back: Normal range of motion and neck supple.     Comments: Left lower extremity edema-chronic   Skin:    General: Skin is warm and dry.     Comments: Bilateral dorsum had raised erythematous rash-improved Left groin chronic edema with erythematous changes.-Improved with residual erythema  Neurological:     Mental Status: He is alert and oriented to person, place, and time. Mental status is at baseline.     Cranial Nerves: No cranial nerve deficit.     Coordination: Coordination normal.  Psychiatric:        Mood and Affect: Mood normal.     LABORATORY DATA:  I have reviewed the data as listed    Latest Ref Rng & Units 07/10/2022    9:42 AM 06/14/2022    9:15 AM 06/07/2022    8:49 AM  CBC  WBC 4.0 - 10.5 K/uL 4.4  6.4  3.2   Hemoglobin 13.0 - 17.0 g/dL 9.3  09.8  9.8   Hematocrit 39.0 - 52.0 % 28.0  31.2  28.9   Platelets 150 - 400 K/uL 179  172  162       Latest Ref Rng & Units 07/10/2022    9:42 AM 06/16/2022    9:19 AM 06/14/2022    9:15 AM  CMP  Glucose 70 - 99  mg/dL 119  147  829   BUN 8 - 23 mg/dL 35  46  47   Creatinine 0.61 - 1.24 mg/dL 5.62  1.30  8.65   Sodium 135 - 145 mmol/L 138  134  135   Potassium 3.5 - 5.1 mmol/L 5.3  5.2  4.6   Chloride 98 - 111 mmol/L 111  110  110   CO2 22 - 32 mmol/L Calcium 8.9 - 10.3 mg/dL 9.4  8.8  8.8   Total Protein 6.5 - 8.1 g/dL 6.8   6.8   Total Bilirubin 0.3 - 1.2 mg/dL 0.7   1.0   Alkaline Phos 38 - 126 U/L 70   74   AST 15 - 41 U/L 35   34   ALT 0 - 44 U/L 46   45      RADIOGRAPHIC STUDIES: I have personally reviewed the radiological images as listed and agreed with the findings in the report. PCV ECHOCARDIOGRAM COMPLETE  Result Date: 05/16/2022 Images from the original result were not included. Reason for Visit  INDICATIONS:   The patient is Z00.00. Echocardiogram: An echocardiogram in (2-d) mode was performed and in Doppler mode with color flow velocity mapping was performed. ventricular septum thickness 1.23 cm, L ventricular posterior wall thickness (diastole) 1.22 cm, left atrium size 4.2 cm, aortic root diameter 3.6 cm, L ventricle diastolic dimension 4.38 cm, L ventricle systolic dimension 3.12, L ventricle ejection fraction 55.6 %, and LV fractional shortening 28.8 % L ventricular outflow tract internal diameter 3.2 cm, L ventricular outflow tract flow velocity 1.38 m/s, aortic valve cusps 1.7 cm , aortic valve flow velocity 1.71 (m/sec), aortic valve systolic calculated mean flow gradient 8 mmHg, mitral valve diastolic peak flow velocity E 7.84 m/sec, and mitral valve diastolic peak flow E/A ratio 3.2 % Mitral valve has trace regurgitation Tricuspid valve has trace regurgitation ASSESSMENT Technically adequate study. Mild left ventricular hypertrophy with GRADE 2 (psuedonormalization)  diastolic dysfunction. Normal right  ventricular systolic function. Normal right ventricular diastolic function. Normal left ventricular wall motion. Normal right ventricular wall motion. Trace tricuspid  regurgitation. Mild pulmonary hypertension. Trace mitral regurgitation. No pericardial effusion. Mildly dilated Left atrium Mild LVH

## 2022-07-10 NOTE — Assessment & Plan Note (Signed)
Hemoglobin is stable. Continue to monitor 

## 2022-07-10 NOTE — Assessment & Plan Note (Signed)
Encourage oral hydration and avoid nephrotoxins.  Recommend patient to make a follow up appt with nephrology. K is trending higher

## 2022-07-10 NOTE — Progress Notes (Signed)
DISCONTINUE OFF PATHWAY REGIMEN - Other   [id number]:Capecitabine 825 mg/m2 PO BID D1-5 q7 Days + RT:   A cycle is every 7 days:     Capecitabine   **Always confirm dose/schedule in your pharmacy ordering system**  REASON: Toxicities / Adverse Event PRIOR TREATMENT: Capecitabine 825 mg/m2 PO BID D1-5 q7 Days + RT TREATMENT RESPONSE: Unable to Evaluate  START OFF PATHWAY REGIMEN - Other   [id number]:Gemcitabine 1,000 mg/m2 IV D1,8,15 q28 Days:   A cycle is every 28 days:     Gemcitabine   **Always confirm dose/schedule in your pharmacy ordering system**  Patient Characteristics: Intent of Therapy: Curative Intent, Discussed with Patient

## 2022-07-12 ENCOUNTER — Inpatient Hospital Stay: Payer: 59

## 2022-07-12 VITALS — BP 139/53 | HR 63 | Temp 98.0°F | Resp 19 | Wt 249.3 lb

## 2022-07-12 DIAGNOSIS — D701 Agranulocytosis secondary to cancer chemotherapy: Secondary | ICD-10-CM | POA: Diagnosis not present

## 2022-07-12 DIAGNOSIS — N1831 Chronic kidney disease, stage 3a: Secondary | ICD-10-CM | POA: Diagnosis not present

## 2022-07-12 DIAGNOSIS — C438 Malignant melanoma of overlapping sites of skin: Secondary | ICD-10-CM

## 2022-07-12 DIAGNOSIS — D631 Anemia in chronic kidney disease: Secondary | ICD-10-CM | POA: Diagnosis not present

## 2022-07-12 DIAGNOSIS — C221 Intrahepatic bile duct carcinoma: Secondary | ICD-10-CM | POA: Diagnosis not present

## 2022-07-12 DIAGNOSIS — I129 Hypertensive chronic kidney disease with stage 1 through stage 4 chronic kidney disease, or unspecified chronic kidney disease: Secondary | ICD-10-CM | POA: Diagnosis not present

## 2022-07-12 DIAGNOSIS — Z85820 Personal history of malignant melanoma of skin: Secondary | ICD-10-CM | POA: Diagnosis not present

## 2022-07-12 DIAGNOSIS — R21 Rash and other nonspecific skin eruption: Secondary | ICD-10-CM | POA: Diagnosis not present

## 2022-07-12 DIAGNOSIS — Z5111 Encounter for antineoplastic chemotherapy: Secondary | ICD-10-CM | POA: Diagnosis not present

## 2022-07-12 DIAGNOSIS — E875 Hyperkalemia: Secondary | ICD-10-CM | POA: Diagnosis not present

## 2022-07-12 MED ORDER — SODIUM CHLORIDE 0.9 % IV SOLN
1000.0000 mg/m2 | Freq: Once | INTRAVENOUS | Status: AC
  Administered 2022-07-12: 2318 mg via INTRAVENOUS
  Filled 2022-07-12: qty 52.61

## 2022-07-12 MED ORDER — PROCHLORPERAZINE MALEATE 10 MG PO TABS
10.0000 mg | ORAL_TABLET | Freq: Once | ORAL | Status: AC
  Administered 2022-07-12: 10 mg via ORAL
  Filled 2022-07-12: qty 1

## 2022-07-12 MED ORDER — SODIUM CHLORIDE 0.9 % IV SOLN
Freq: Once | INTRAVENOUS | Status: AC
  Filled 2022-07-12: qty 250

## 2022-07-12 MED ORDER — HEPARIN SOD (PORK) LOCK FLUSH 100 UNIT/ML IV SOLN
INTRAVENOUS | Status: AC
  Filled 2022-07-12: qty 5

## 2022-07-12 MED ORDER — HEPARIN SOD (PORK) LOCK FLUSH 100 UNIT/ML IV SOLN
500.0000 [IU] | Freq: Once | INTRAVENOUS | Status: DC | PRN
  Filled 2022-07-12: qty 5

## 2022-07-12 NOTE — Patient Instructions (Addendum)
[facility]  Discharge Instructions: Thank you for choosing [facility] to provide your oncology and hematology care.  If you have a lab appointment with the Cancer Center, please go directly to the Cancer Center and check in at the registration area.  Wear comfortable clothing and clothing appropriate for easy access to any Portacath or PICC line.   We strive to give you quality time with your provider. You may need to reschedule your appointment if you arrive late (15 or more minutes).  Arriving late affects you and other patients whose appointments are after yours.  Also, if you miss three or more appointments without notifying the office, you may be dismissed from the clinic at the provider's discretion.      For prescription refill requests, have your pharmacy contact our office and allow 72 hours for refills to be completed.    Today you received the following chemotherapy and/or immunotherapy agents Gemzar   To help prevent nausea and vomiting after your treatment, we encourage you to take your nausea medication as directed.  BELOW ARE SYMPTOMS THAT SHOULD BE REPORTED IMMEDIATELY: *FEVER GREATER THAN 100.4 F (38 C) OR HIGHER *CHILLS OR SWEATING *NAUSEA AND VOMITING THAT IS NOT CONTROLLED WITH YOUR NAUSEA MEDICATION *UNUSUAL SHORTNESS OF BREATH *UNUSUAL BRUISING OR BLEEDING *URINARY PROBLEMS (pain or burning when urinating, or frequent urination) *BOWEL PROBLEMS (unusual diarrhea, constipation, pain near the anus) TENDERNESS IN MOUTH AND THROAT WITH OR WITHOUT PRESENCE OF ULCERS (sore throat, sores in mouth, or a toothache) UNUSUAL RASH, SWELLING OR PAIN  UNUSUAL VAGINAL DISCHARGE OR ITCHING   Items with * indicate a potential emergency and should be followed up as soon as possible or go to the Emergency Department if any problems should occur.  Please show the CHEMOTHERAPY ALERT CARD or IMMUNOTHERAPY ALERT CARD at check-in to the  Emergency Department and triage nurse.  Should you have questions after your visit or need to cancel or reschedule your appointment, please contact [facility]  [phone] and follow the prompts.  Office hours are 8:00 a.m. to 4:30 p.m. Monday - Friday. Please note that voicemails left after 4:00 p.m. may not be returned until the following business day.  We are closed weekends and major holidays. You have access to a nurse at all times for urgent questions. Please call the main number to the clinic [phone] and follow the prompts.  For any non-urgent questions, you may also contact your provider using MyChart. We now offer e-Visits for anyone 74 and older to request care online for non-urgent symptoms. For details visit [email or web address].   Also download the MyChart app! Go to the app store, search "MyChart", open the app, select [facility], and log in with your MyChart username and password.   [phone] N\Gemcitabine Injection What is this medication? GEMCITABINE (jem SYE ta been) treats some types of cancer. It works by slowing down the growth of cancer cells. This medicine may be used for other purposes; ask your health care provider or pharmacist if you have questions. COMMON BRAND NAME(S): Gemzar, Infugem What should I tell my care team before I take this medication? They need to know if you have any of these conditions: Blood disorders Infection Kidney disease Liver disease Lung or breathing disease, such as asthma or COPD Recent or ongoing radiation therapy An unusual or allergic reaction to gemcitabine, other medications, foods, dyes, or preservatives If you or your partner are pregnant or trying  to get pregnant Breast-feeding How should I use this medication? This medication is injected into a vein. It is given by your care team in a hospital or clinic setting. Talk to your care team about the use  of this medication in children. Special care may be needed. Overdosage: If you think you have taken too much of this medicine contact a poison control center or emergency room at once. NOTE: This medicine is only for you. Do not share this medicine with others. What if I miss a dose? Keep appointments for follow-up doses. It is important not to miss your dose. Call your care team if you are unable to keep an appointment. What may interact with this medication? Interactions have not been studied. This list may not describe all possible interactions. Give your health care provider a list of all the medicines, herbs, non-prescription drugs, or dietary supplements you use. Also tell them if you smoke, drink alcohol, or use illegal drugs. Some items may interact with your medicine. What should I watch for while using this medication? Your condition will be monitored carefully while you are receiving this medication. This medication may make you feel generally unwell. This is not uncommon, as chemotherapy can affect healthy cells as well as cancer cells. Report any side effects. Continue your course of treatment even though you feel ill unless your care team tells you to stop. In some cases, you may be given additional medications to help with side effects. Follow all directions for their use. This medication may increase your risk of getting an infection. Call your care team for advice if you get a fever, chills, sore throat, or other symptoms of a cold or flu. Do not treat yourself. Try to avoid being around people who are sick. This medication may increase your risk to bruise or bleed. Call your care team if you notice any unusual bleeding. Be careful brushing or flossing your teeth or using a toothpick because you may get an infection or bleed more easily. If you have any dental work done, tell your dentist you are receiving this medication. Avoid taking medications that contain aspirin, acetaminophen,  ibuprofen, naproxen, or ketoprofen unless instructed by your care team. These medications may hide a fever. Talk to your care team if you or your partner wish to become pregnant or think you might be pregnant. This medication can cause serious birth defects if taken during pregnancy and for 6 months after the last dose. A negative pregnancy test is required before starting this medication. A reliable form of contraception is recommended while taking this medication and for 6 months after the last dose. Talk to your care team about effective forms of contraception. Do not father a child while taking this medication and for 3 months after the last dose. Use a condom while having sex during this time period. Do not breastfeed while taking this medication and for at least 1 week after the last dose. This medication may cause infertility. Talk to your care team if you are concerned about your fertility. What side effects may I notice from receiving this medication? Side effects that you should report to your care team as soon as possible: Allergic reactions--skin rash, itching, hives, swelling of the face, lips, tongue, or throat Capillary leak syndrome--stomach or muscle pain, unusual weakness or fatigue, feeling faint or lightheaded, decrease in the amount of urine, swelling of the ankles, hands, or feet, trouble breathing Infection--fever, chills, cough, sore throat, wounds that don't heal, pain or  trouble when passing urine, general feeling of discomfort or being unwell Liver injury--right upper belly pain, loss of appetite, nausea, light-colored stool, dark yellow or brown urine, yellowing skin or eyes, unusual weakness or fatigue Low red blood cell level--unusual weakness or fatigue, dizziness, headache, trouble breathing Lung injury--shortness of breath or trouble breathing, cough, spitting up blood, chest pain, fever Stomach pain, bloody diarrhea, pale skin, unusual weakness or fatigue, decrease in  the amount of urine, which may be signs of hemolytic uremic syndrome Sudden and severe headache, confusion, change in vision, seizures, which may be signs of posterior reversible encephalopathy syndrome (PRES) Unusual bruising or bleeding Side effects that usually do not require medical attention (report to your care team if they continue or are bothersome): Diarrhea Drowsiness Hair loss Nausea Pain, redness, or swelling with sores inside the mouth or throat Vomiting This list may not describe all possible side effects. Call your doctor for medical advice about side effects. You may report side effects to FDA at 1-800-FDA-1088. Where should I keep my medication? This medication is given in a hospital or clinic. It will not be stored at home. NOTE: This sheet is a summary. It may not cover all possible information. If you have questions about this medicine, talk to your doctor, pharmacist, or health care provider.  2023 [employer] Standard (2021-07-19 00:00:00)

## 2022-07-18 DIAGNOSIS — Z96642 Presence of left artificial hip joint: Secondary | ICD-10-CM | POA: Diagnosis not present

## 2022-07-19 ENCOUNTER — Inpatient Hospital Stay: Payer: 59

## 2022-07-19 ENCOUNTER — Inpatient Hospital Stay (HOSPITAL_BASED_OUTPATIENT_CLINIC_OR_DEPARTMENT_OTHER): Payer: 59 | Admitting: Oncology

## 2022-07-19 ENCOUNTER — Other Ambulatory Visit: Payer: Self-pay

## 2022-07-19 ENCOUNTER — Encounter: Payer: Self-pay | Admitting: Oncology

## 2022-07-19 VITALS — BP 118/46 | HR 63 | Temp 98.4°F | Resp 18 | Wt 250.3 lb

## 2022-07-19 DIAGNOSIS — Z5111 Encounter for antineoplastic chemotherapy: Secondary | ICD-10-CM | POA: Diagnosis not present

## 2022-07-19 DIAGNOSIS — C438 Malignant melanoma of overlapping sites of skin: Secondary | ICD-10-CM

## 2022-07-19 DIAGNOSIS — D701 Agranulocytosis secondary to cancer chemotherapy: Secondary | ICD-10-CM | POA: Diagnosis not present

## 2022-07-19 DIAGNOSIS — N1831 Chronic kidney disease, stage 3a: Secondary | ICD-10-CM

## 2022-07-19 DIAGNOSIS — T451X5A Adverse effect of antineoplastic and immunosuppressive drugs, initial encounter: Secondary | ICD-10-CM | POA: Insufficient documentation

## 2022-07-19 DIAGNOSIS — R21 Rash and other nonspecific skin eruption: Secondary | ICD-10-CM | POA: Diagnosis not present

## 2022-07-19 DIAGNOSIS — C221 Intrahepatic bile duct carcinoma: Secondary | ICD-10-CM

## 2022-07-19 DIAGNOSIS — D631 Anemia in chronic kidney disease: Secondary | ICD-10-CM | POA: Diagnosis not present

## 2022-07-19 DIAGNOSIS — Z85820 Personal history of malignant melanoma of skin: Secondary | ICD-10-CM | POA: Diagnosis not present

## 2022-07-19 LAB — CBC WITH DIFFERENTIAL (CANCER CENTER ONLY)
Abs Immature Granulocytes: 0.11 10*3/uL — ABNORMAL HIGH (ref 0.00–0.07)
Basophils Absolute: 0 10*3/uL (ref 0.0–0.1)
Basophils Relative: 1 %
Eosinophils Absolute: 0 10*3/uL (ref 0.0–0.5)
Eosinophils Relative: 1 %
HCT: 23.6 % — ABNORMAL LOW (ref 39.0–52.0)
Hemoglobin: 8.2 g/dL — ABNORMAL LOW (ref 13.0–17.0)
Immature Granulocytes: 5 %
Lymphocytes Relative: 12 %
Lymphs Abs: 0.3 10*3/uL — ABNORMAL LOW (ref 0.7–4.0)
MCH: 35.2 pg — ABNORMAL HIGH (ref 26.0–34.0)
MCHC: 34.7 g/dL (ref 30.0–36.0)
MCV: 101.3 fL — ABNORMAL HIGH (ref 80.0–100.0)
Monocytes Absolute: 0.3 10*3/uL (ref 0.1–1.0)
Monocytes Relative: 14 %
Neutro Abs: 1.6 10*3/uL — ABNORMAL LOW (ref 1.7–7.7)
Neutrophils Relative %: 67 %
Platelet Count: 112 10*3/uL — ABNORMAL LOW (ref 150–400)
RBC: 2.33 MIL/uL — ABNORMAL LOW (ref 4.22–5.81)
RDW: 15 % (ref 11.5–15.5)
WBC Count: 2.4 10*3/uL — ABNORMAL LOW (ref 4.0–10.5)
nRBC: 3.3 % — ABNORMAL HIGH (ref 0.0–0.2)

## 2022-07-19 LAB — CMP (CANCER CENTER ONLY)
ALT: 49 U/L — ABNORMAL HIGH (ref 0–44)
AST: 38 U/L (ref 15–41)
Albumin: 3.9 g/dL (ref 3.5–5.0)
Alkaline Phosphatase: 82 U/L (ref 38–126)
Anion gap: 8 (ref 5–15)
BUN: 36 mg/dL — ABNORMAL HIGH (ref 8–23)
CO2: 19 mmol/L — ABNORMAL LOW (ref 22–32)
Calcium: 8.8 mg/dL — ABNORMAL LOW (ref 8.9–10.3)
Chloride: 108 mmol/L (ref 98–111)
Creatinine: 1.56 mg/dL — ABNORMAL HIGH (ref 0.61–1.24)
GFR, Estimated: 50 mL/min — ABNORMAL LOW (ref >60)
Glucose, Bld: 145 mg/dL — ABNORMAL HIGH (ref 70–99)
Potassium: 4.5 mmol/L (ref 3.5–5.1)
Sodium: 135 mmol/L (ref 135–145)
Total Bilirubin: 0.5 mg/dL (ref 0.3–1.2)
Total Protein: 6.9 g/dL (ref 6.5–8.1)

## 2022-07-19 LAB — RETIC PANEL
Immature Retic Fract: 36.6 % — ABNORMAL HIGH (ref 2.3–15.9)
RBC.: 2.36 MIL/uL — ABNORMAL LOW (ref 4.22–5.81)
Retic Count, Absolute: 61.1 10*3/uL (ref 19.0–186.0)
Retic Ct Pct: 2.6 % (ref 0.4–3.1)
Reticulocyte Hemoglobin: 35.1 pg (ref >27.9)

## 2022-07-19 MED ORDER — FERROUS SULFATE 325 (65 FE) MG PO TBEC: 1.0000 | DELAYED_RELEASE_TABLET | Freq: Every day | ORAL | 1 refills | Status: DC

## 2022-07-19 MED ORDER — SODIUM CHLORIDE 0.9 % IV SOLN
1000.0000 mg/m2 | Freq: Once | INTRAVENOUS | Status: AC
  Administered 2022-07-19: 2318 mg via INTRAVENOUS
  Filled 2022-07-19: qty 60.96

## 2022-07-19 MED ORDER — PROCHLORPERAZINE MALEATE 10 MG PO TABS
10.0000 mg | ORAL_TABLET | Freq: Once | ORAL | Status: AC
  Administered 2022-07-19: 10 mg via ORAL
  Filled 2022-07-19: qty 1

## 2022-07-19 MED ORDER — HEPARIN SOD (PORK) LOCK FLUSH 100 UNIT/ML IV SOLN
500.0000 [IU] | Freq: Once | INTRAVENOUS | Status: AC | PRN
  Administered 2022-07-19: 500 [IU]
  Filled 2022-07-19: qty 5

## 2022-07-19 MED ORDER — SODIUM CHLORIDE 0.9 % IV SOLN
Freq: Once | INTRAVENOUS | Status: AC
  Filled 2022-07-19: qty 250

## 2022-07-19 NOTE — Assessment & Plan Note (Signed)
Monitor ANC closely.  Neutropenia precaution.

## 2022-07-19 NOTE — Assessment & Plan Note (Signed)
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  Continue ferrous sulfate  daily - OTC Trend hemoglobin weekly.

## 2022-07-19 NOTE — Assessment & Plan Note (Signed)
Chemotherapy plan as listed above 

## 2022-07-19 NOTE — Assessment & Plan Note (Signed)
Encourage oral hydration and avoid nephrotoxins.  K has improved.

## 2022-07-19 NOTE — Progress Notes (Signed)
Pt here for follow up. NO more new rash outbreaks. Pt reports that Dr. Ronn Melena decreased lisinopril to  because it was increasing potassium level.

## 2022-07-19 NOTE — Assessment & Plan Note (Signed)
Patient has no radiographic evidence of metastatic melanoma on recent CT scan done at [facility] immunotherapy. Continue surveillance 

## 2022-07-19 NOTE — Progress Notes (Signed)
Hematology/Oncology Progress note Telephone:([phone] Fax:([phone]) [phone]    CHIEF COMPLAINTS/REASON FOR VISIT:  Follow up for melanoma, cholangiocarcinoma.    ASSESSMENT & PLAN:   Cancer Staging  Cholangiocarcinoma Staging form: Intrahepatic Bile Duct, AJCC 8th Edition - Pathologic stage from 01/25/2022: Stage Unknown (pT1a, pNX, cM0) - Signed by Rickard Patience, MD on 02/21/2022  Malignant melanoma of overlapping sites Staging form: Melanoma of the Skin, AJCC 8th Edition - Pathologic: Stage Unknown (rpTX, pN1b, cM0) - Signed by Rickard Patience, MD on 07/27/2020 - Pathologic: No stage assigned - Unsigned   Cholangiocarcinoma T1a Nx Cholangiocarcinoma Pathology was reviewed and discussed with patient.  S/p chemotherapy Xeloda 825mg /m2 BID+ concurrent RT Previously on adjuvant Xeloda [1000 mg/m2 BID for 14 of every 21 days] -dose reduced to 825mg /m2 BID due to skin rash, still not able to tolerate- discontinue.  switched to gemcitabine weekly, 2 weeks on, 1 week off, [adjustment due to cytopenia] Labs are reviewed and discussed with patient.  proceed with cycle 1 D8 gemcitabine on 07/12/22    Malignant melanoma of overlapping sites [facility]) Patient has no radiographic evidence of metastatic melanoma on recent CT scan done at [facility] immunotherapy. Continue surveillance  Encounter for antineoplastic chemotherapy Chemotherapy plan as listed above.   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  Continue ferrous sulfate 325mg  daily - OTC Trend hemoglobin weekly.   Stage 3a chronic kidney disease (HCC) Encourage oral hydration and avoid nephrotoxins.  K has improved.     Chemotherapy induced neutropenia Monitor ANC closely.  Neutropenia precaution.     Follow-up  2 weeks lab MD Gemcitabine.  All questions were answered. The patient knows to call the clinic with any problems, questions or concerns.  Rickard Patience, MD, PhD [facility] Hematology  Oncology 07/19/2022    HISTORY OF PRESENTING ILLNESS:   Edward Pitts is a  62 y.o.  male presents for recurrent malignant melanoma.   Oncology History  Malignant melanoma of overlapping sites  04/16/2019 Cancer Staging   Staging form: Melanoma of the Skin, AJCC 8th Edition - Pathologic: Stage Unknown (rpTX, pN1b, cM0) - Signed by Rickard Patience, MD on 07/27/2020 Stage prefix: Recurrence    04/23/2019 Initial Diagnosis   Malignant melanoma   -He has a history of left lower extremity melanoma in 2011, status post local excision -04/16/2019 patient underwent left groin mass resection  Resection pathology showed malignant melanoma, replacing a lymph node, with extracapsular extension, peripheral and deep margins involved.  Left inguinal contents, all 7 lymph nodes were negative for melanoma in the lymph nodes. Extranodal melanoma identified in lymphatic and interstitium between nodes -PDL1 80% TPS    07/07/2019 -  Radiation Therapy   status post adjuvant radiation.   07/23/2019 - 07/21/2021 Chemotherapy   Nivolumab q14d      06/29/2020 Imaging   CT chest abdomen pelvis showed stable postoperative appearance of the left groin.  No evidence of local recurrence.  No evidence of metastatic disease in the chest abdomen or pelvis.  Hepatic steatosis.  Stable subcentimeter fluid attenuation lesion of the lateral right lobe of the liver, likely benign cyst or hemangioma.  Coronary artery disease.  Aortic atherosclerosis   10/20/2020 Imaging   CT chest abdomen pelvis showed stable postoperative/radiation appearance of the left groin.  No evidence of local recurrence/metastatic disease within the chest abdomen/pelvis.  Fatty liver disease.  Diverticulosis without evidence of typhlitis.  Aortic atherosclerosis   03/10/2021 Imaging   MRI brain without contrast showed no  definitive evidence of intracranial metastatic disease.  Study is limited by absence of intravenous contrast.     04/26/2021 Imaging   CT  chest abdomen pelvis without contrast showed stable post operative changes of left groin with no evidence of recurrent disease.  No evidence of metastatic disease in the chest abdomen pelvis.  Aortic atherosclerosis   08/08/2021 - 08/16/2021 Hospital Admission    patient was hospitalized due to NSTEMI status post CABG x3.  He also had pacemaker The echocardiogram showed left ventricular ejection fraction of 65 to 70%,   09/15/2021 Imaging   CT chest abdomen pelvis w contrast  IMPRESSION: 1. Subtle hypodense 9 mm lesion in the left lobe of the liver is new from prior imaging including previous contrasted CT dating back to December 10, 2019, with the lesion appearing to equilibrate with background liver on delayed imaging sequence but is incompletely evaluated on this imaging study and technically nonspecific possibly reflecting a benign perfusional variant and while its appearance is not typical for that of a melanoma metastasis, it is not excluded on this examination. Suggest more definitive characterization by hepatic protocol MRI with and without contrast. 2. Stable postoperative changes in the left groin without evidence of local recurrent disease.3. No evidence of metastatic disease in the chest or pelvis.4.  Aortic Atherosclerosis (ICD10-I70.0).      09/23/2021 Imaging   Contrast-enhanced liver ultrasound Mildly hypoenhancing 2.2 cm mass in the posterior aspect of the left lobe of the liver with washout characteristics concerning for non hepatocellular malignancy, concerning for melanotic metastasis given history. The lesion is in an unfavorable location for percutaneous biopsy. Consider PET-CT for further characterization   10/21/2021 -  Chemotherapy   Nivolumab q14d      11/10/2021 Imaging   MRI Brain w wo contrast Negative for metastatic disease to the brain   11/10/2021 Imaging   MRI abdomen w wo contrast 1. Lesion of the posterior superior left lobe of the liver, hepatic segment II,  measuring 1.9 x 1.8 cm corresponding to findings of prior imaging. Evaluation is somewhat limited breath motion artifact however there is subtle associated rim enhancement of this lesion. Findings are most in keeping with a hepatic metastasis in the setting of known recurrent melanoma. 2. Mild hepatic steatosis. 3. Cardiomegaly.    02/27/2022 - 02/27/2022 Chemotherapy   Patient is on Treatment Plan : Capecitabine (825 mg/m2 bid) + XRT     07/12/2022 -  Chemotherapy   Patient is on Treatment Plan : PANCREAS Gemcitabine D1,8(1000) q21d     Cholangiocarcinoma  12/19/2021 Initial Diagnosis   Liver biopsy showed carcinoma  -The slides on the patient's prior left inguinal lymph node biopsy ([id number]) with metastatic melanoma were reviewed in conjunction with  this case. The morphology of the malignant cells between the two cases are dissimilar. A limited panel of immunohistochemical stains was performed and the neoplastic cells are positive for superpancytokeratin, cytokeratin 7 (diffuse, strong), cytokeratin 20 (patchy, moderate) and negative for S100, SOX-10, and HepPar-1.These findings are consistent with carcinoma. The morphologic findings and pattern of immunohistochemical staining are non-specific and possible sites of origin include but are not limited to pancreaticobiliary tract, GI tract, prostate, kidney, lung, and breast. Per [facility], the patient had a PET scan performed in July 2023 which demonstrated a single hepatic  hypermetabolic lesion without other sites of disease.    01/19/2022 Surgery   Liver lesion resection at [facility] by Dr.Zani  A.  Liver, segment 2, 3, 4A, partial hepatectomy:   Cholangiocarcinoma,  moderately differentiated (2.5 cm, segment 2).  Tumor extends to hepatic parenchymal margin, but all other margins are negative for tumor. See synoptic report and comment.   Background liver with: Mild steatosis (20%).  No definitive evidence of steatohepatitis.   Mild periportal  fibrosis (trichrome and reticulin). No stainable iron (Prussian blue stain).  No evidence to support alpha-1-antitrypsin deficiency on PAS-D stain.   pT1a pNx   01/25/2022 Cancer Staging   Staging form: Intrahepatic Bile Duct, AJCC 8th Edition - Pathologic stage from 01/25/2022: Stage Unknown (pT1a, pNX, cM0) - Signed by Rickard Patience, MD on 02/21/2022 Stage prefix: Initial diagnosis   03/23/2022 - 05/01/2022 Radiation Therapy   Concurrent Xeloda 825mg /m2 BID + Radiation.    05/08/2022 -  Chemotherapy   Xeloda [1000 mg/m2 BID for 14 of every 21 days], plan 4 months.   05/08/2022, cycle 1 Xeloda.  Xeloda was stopped on 05/19/22 due to developing rash on face as well as dorsum of hands. Treated with steroid,symptoms resolved.  3/4-3/13/2024  Xeloda was stopped early due to groin cellulitis, he also developed similar skin rash again  Plan 3/25 cycle 3 dose reduce Xeloda 825 mg/m2   2000mg  BID x 14 days.   05/09/2022 Imaging   CT chest abdomen pelvis with contrast at [facility] showed Status post partial left hepatectomy without CT evidence of recurrent or metastatic disease in the chest, abdomen, pelvis.      04/18/2021, patient establish care with neurology Dr. Barbaraann Cao for intermittent altered cognition.  he was recommended to start Vimpat.    INTERVAL HISTORY Edward Pitts is a 62 y.o. male who has above history reviewed by me today presents for follow up visit for management of inguinal nodal recurrence of melanoma, resected T1a cholangiocarcinoma. S/p Gemcitabine. Tolerates well.  Skin rash has improved.  + fatigue. At baseline.     :Review of Systems  Constitutional:  Positive for fatigue. Negative for appetite change, chills, fever and unexpected weight change.  HENT:   Negative for hearing loss and voice change.   Eyes:  Negative for eye problems and icterus.  Respiratory:  Negative for chest tightness, cough and shortness of breath.   Cardiovascular:  Positive for leg swelling. Negative for  chest pain.  Gastrointestinal:  Negative for abdominal distention and abdominal pain.  Endocrine: Negative for hot flashes.  Genitourinary:  Negative for difficulty urinating, dysuria and frequency.   Musculoskeletal:        Status post left hip replacement  Skin:  Negative for itching.       Skin hypo-pigmentation on upper extremities, no change   Neurological:  Negative for light-headedness and numbness.       Spells  Hematological:  Negative for adenopathy. Does not bruise/bleed easily.  Psychiatric/Behavioral:  Negative for confusion.     MEDICAL HISTORY:  Past Medical History:  Diagnosis Date   Anemia    iron treatments   Anxiety    Aortic atherosclerosis    Arthritis    Cancer of groin 2021   left groin, resected, radiation   Cataract    Complication of anesthesia    PONV   Coronary artery disease    Dizziness of unknown etiology    has led to seizures and passing out.   Family history of adverse reaction to anesthesia    PONV mother   GERD (gastroesophageal reflux disease)    History of complete heart block    PPM placed   Hyperlipidemia    Hypertension    LBBB (left  bundle branch block)    Lymphedema of left leg    uses thigh high compression stockings   Melanoma 2012   skin cancer, left thigh   OSA on CPAP    PONV (postoperative nausea and vomiting) 04/16/2019   Port-A-Cath in place    RIGHT chest wall   Presence of cardiac pacemaker    [employer]   Seizures    still has episodes of dizziness. last event 1 month ago (march 2022) and will pass out. takes clonazepam    SURGICAL HISTORY: Past Surgical History:  Procedure Laterality Date   CORONARY ARTERY BYPASS GRAFT N/A 08/10/2021   Procedure: CORONARY ARTERY BYPASS GRAFTING (CABG) X 3 USING LEFT INTERNAL MAMMARY ARTERY AND RIGHT GREATER SAPHENOUS VEIN;  Surgeon: Lovett Sox, MD;  Location: [facility] OR;  Service: Open Heart Surgery;  Laterality: N/A;   CT RADIATION THERAPY GUIDE     left groin   dental  implant     permanent implant   ENDOVEIN HARVEST OF GREATER SAPHENOUS VEIN Right 08/10/2021   Procedure: ENDOVEIN HARVEST OF GREATER SAPHENOUS VEIN;  Surgeon: Lovett Sox, MD;  Location: [facility] OR;  Service: Open Heart Surgery;  Laterality: Right;   KNEE SURGERY Left    arthroscopy   LEFT HEART CATH AND CORONARY ANGIOGRAPHY Left 06/29/2017   Procedure: LEFT HEART CATH AND CORONARY ANGIOGRAPHY;  Surgeon: Lamar Blinks, MD;  Location: [facility] INVASIVE CV LAB;  Service: Cardiovascular;  Laterality: Left;   LEFT HEART CATH AND CORONARY ANGIOGRAPHY N/A 08/08/2021   Procedure: LEFT HEART CATH AND CORONARY ANGIOGRAPHY;  Surgeon: Lamar Blinks, MD;  Location: [facility] INVASIVE CV LAB;  Service: Cardiovascular;  Laterality: N/A;   LYMPH NODE DISSECTION Left 04/16/2019   Procedure: Left inguinal Lymph Node Dissection;  Surgeon: Almond Lint, MD;  Location: [facility] OR;  Service: General;  Laterality: Left;   MELANOMA EXCISION Left 04/16/2019   Procedure: MELANOMA EXCISION LEFT GROIN MASS;  Surgeon: Almond Lint, MD;  Location: [facility] OR;  Service: General;  Laterality: Left;   MELANOMA EXCISION WITH SENTINEL LYMPH NODE BIOPSY Left 2012   Left calf    PACEMAKER INSERTION N/A 08/26/2018   Procedure: INSERTION PACEMAKER;  Surgeon: Marcina Millard, MD;  Location: [facility] ORS;  Service: Cardiovascular;  Laterality: N/A;   PORTA CATH INSERTION N/A 08/26/2019   Procedure: PORTA CATH INSERTION;  Surgeon: Renford Dills, MD;  Location: [facility] INVASIVE CV LAB;  Service: Cardiovascular;  Laterality: N/A;   SUPERFICIAL LYMPH NODE BIOPSY / EXCISION Left 2020   lymph nodes removed around left groin melanoma site   TEE WITHOUT CARDIOVERSION N/A 08/10/2021   Procedure: TRANSESOPHAGEAL ECHOCARDIOGRAM (TEE);  Surgeon: Lovett Sox, MD;  Location: [facility] OR;  Service: Open Heart Surgery;  Laterality: N/A;   TEMPORARY PACEMAKER N/A 08/25/2018   Procedure: TEMPORARY PACEMAKER;  Surgeon: Tonny Bollman, MD;  Location: [facility] INVASIVE CV  LAB;  Service: Cardiovascular;  Laterality: N/A;   TOTAL HIP ARTHROPLASTY Left 07/14/2020   Procedure: TOTAL HIP ARTHROPLASTY;  Surgeon: Donato Heinz, MD;  Location: [facility] ORS;  Service: Orthopedics;  Laterality: Left;    SOCIAL HISTORY: Social History   Socioeconomic History   Marital status: Married    Spouse name: [name]    Number of children: 7   Years of education: 12   Highest education level: Not on file  Occupational History    Comment: disability  Tobacco Use   Smoking status: Never   Smokeless tobacco: Never  Vaping Use   Vaping Use: Never used  Substance and Sexual Activity   Alcohol use: No   Drug use: No   Sexual activity: Not Currently  Other Topics Concern   Not on file  Social History Narrative   Lives with  Wife,   Has 2 small dogs   Caffeine use: sodas (2 per day)      Out of work on disability.  Has a walk in shower. No stairs to climb   Oncology treatment ongoing. Uses port a cath for treatment.      pacemaker   Social Determinants of Health   Financial Resource Strain: Low Risk  (02/12/2019)   Overall Financial Resource Strain (CARDIA)    Difficulty of Paying Living Expenses: Not hard at all  Food Insecurity: Food Insecurity Present (02/21/2022)   Hunger Vital Sign    Worried About Running Out of Food in the Last Year: Sometimes true    Ran Out of Food in the Last Year: Often true  Transportation Needs: No Transportation Needs (02/21/2022)   PRAPARE - Administrator, Civil Service (Medical): No    Lack of Transportation (Non-Medical): No  Physical Activity: Unknown (02/12/2019)   Exercise Vital Sign    Days of Exercise per Week: 0 days    Minutes of Exercise per Session: Not on file  Stress: No Stress Concern Present (02/12/2019)   [employer] of Occupational Health - Occupational Stress Questionnaire    Feeling of Stress : Only a little  Social Connections: Unknown (02/12/2019)   Social Connection and Isolation Panel  [NHANES]    Frequency of Communication with Friends and Family: More than three times a week    Frequency of Social Gatherings with Friends and Family: Not on file    Attends Religious Services: Not on file    Active Member of Clubs or Organizations: Not on file    Attends Banker Meetings: Not on file    Marital Status: Married  Intimate Partner Violence: Not At Risk (02/21/2022)   Humiliation, Afraid, Rape, and Kick questionnaire    Fear of Current or Ex-Partner: No    Emotionally Abused: No    Physically Abused: No    Sexually Abused: No    FAMILY HISTORY: Family History  Problem Relation Age of Onset   Cancer Paternal Grandmother     ALLERGIES:  is allergic to ibuprofen, levetiracetam, and nsaids.  MEDICATIONS:  Current Outpatient Medications  Medication Sig Dispense Refill   apixaban (ELIQUIS) 5 MG TABS tablet Take 5 mg by mouth 2 (two) times daily.     aspirin 81 MG EC tablet Take 81 mg by mouth daily.     atorvastatin (LIPITOR) 20 MG tablet Take 20 mg by mouth daily.     Carboxymeth-Glyc-Polysorb PF (REFRESH OPTIVE MEGA-3) 0.5-1-0.5 % SOLN Place 1 drop into both eyes daily as needed (dry eyes).     clonazePAM (KLONOPIN) 0.5 MG tablet TAKE 1 TABLET BY MOUTH IN THE MORNING AND 1 BY MOUTH IN THE EVENING per pt 120 tablet 1   clotrimazole (CLOTRIMAZOLE ANTI-FUNGAL) 1 % cream Apply 1 Application topically 2 (two) times daily. Apply to the L inguinal fold 30 g 0   febuxostat (ULORIC) 40 MG tablet Take 40 mg by mouth daily.     hydrocortisone 2.5 % ointment Apply 1 application. topically daily as needed (itching).     Lacosamide 100 MG TABS Take 1 tablet (100 mg total) by mouth in the morning and at bedtime. 60 tablet 3   lidocaine-prilocaine (EMLA)  cream Apply 1 application. topically as needed. Apply small amount of cream to port site approx 1-2 hours prior to appointment. 30 g 11   lisinopril (ZESTRIL) 10 MG tablet Take 1 tablet by mouth daily.     metoprolol  succinate (TOPROL-XL) 25 MG 24 hr tablet Take 25 mg by mouth daily.     Multiple Vitamin (MULTIVITAMIN WITH MINERALS) TABS tablet Take 1 tablet by mouth daily. Centrum Silver     nystatin (MYCOSTATIN/NYSTOP) powder Apply 1 Application topically 3 (three) times daily. 60 g 1   omeprazole (PRILOSEC) 20 MG capsule TAKE 1 CAPSULE BY MOUTH EVERY DAY 90 capsule 0   ondansetron (ZOFRAN) 4 MG tablet TAKE 1 TABLET BY MOUTH EVERY 8 HOURS AS NEEDED FOR NAUSEA AND VOMITING 90 tablet 1   zinc oxide 20 % ointment Apply 1 Application topically as needed for irritation. 56.7 g 0   capecitabine (XELODA) 500 MG tablet Take 4 tablets (2,000 mg total) by mouth 2 (two) times daily after a meal. Take for 14 days, then hold for 7 days. Repeat every 21 days. (Patient not taking: Reported on 07/10/2022) 112 tablet 0   ferrous sulfate 325 (65 FE) MG EC tablet Take 1 tablet (325 mg total) by mouth daily. 180 tablet 1   traZODone (DESYREL) 50 MG tablet TAKE 1 TABLET BY MOUTH EVERY DAY AT BEDTIME AS NEEDED FOR SLEEP (Patient not taking: Reported on 07/19/2022) 90 tablet 1   No current facility-administered medications for this visit.   Facility-Administered Medications Ordered in Other Visits  Medication Dose Route Frequency Provider Last Rate Last Admin   heparin lock flush 100 UNIT/ML injection            heparin lock flush 100 UNIT/ML injection              PHYSICAL EXAMINATION: ECOG PERFORMANCE STATUS: 1 - Symptomatic but completely ambulatory Vitals:   07/19/22 0914  BP: (!) 118/46  Pulse: 63  Resp: 18  Temp: 98.4 F (36.9 C)   Filed Weights   07/19/22 0914  Weight: 250 lb 4.8 oz (113.5 kg)    Physical Exam Constitutional:      General: He is not in acute distress.    Comments: Patient ambulates independently  HENT:     Head: Normocephalic and atraumatic.  Eyes:     General: No scleral icterus.    Pupils: Pupils are equal, round, and reactive to light.  Cardiovascular:     Rate and Rhythm: Normal  rate and regular rhythm.     Heart sounds: Normal heart sounds.  Pulmonary:     Effort: Pulmonary effort is normal. No respiratory distress.     Breath sounds: No wheezing.  Abdominal:     General: Bowel sounds are normal. There is no distension.     Palpations: Abdomen is soft. There is no mass.     Tenderness: There is no abdominal tenderness.     Comments:    Musculoskeletal:        General: No deformity. Normal range of motion.     Cervical back: Normal range of motion and neck supple.     Comments: Left lower extremity edema-chronic   Skin:    General: Skin is warm and dry.     Comments: Bilateral dorsum had raised erythematous rash-improved Left groin chronic edema with erythematous changes.-Improved with residual erythema  Neurological:     Mental Status: He is alert and oriented to person, place, and time. Mental status is at  baseline.     Cranial Nerves: No cranial nerve deficit.     Coordination: Coordination normal.  Psychiatric:        Mood and Affect: Mood normal.     LABORATORY DATA:  I have reviewed the data as listed    Latest Ref Rng & Units 07/19/2022    9:03 AM 07/10/2022    9:42 AM 06/14/2022    9:15 AM  CBC  WBC 4.0 - 10.5 K/uL 2.4  4.4  6.4   Hemoglobin 13.0 - 17.0 g/dL 8.2  9.3  40.9   Hematocrit 39.0 - 52.0 % 23.6  28.0  31.2   Platelets 150 - 400 K/uL 112  179  172       Latest Ref Rng & Units 07/19/2022    9:03 AM 07/10/2022    9:42 AM 06/16/2022    9:19 AM  CMP  Glucose 70 - 99 mg/dL 811  914  782   BUN 8 - 23 mg/dL 36  35  46   Creatinine 0.61 - 1.24 mg/dL 9.56  2.13  0.86   Sodium 135 - 145 mmol/L 135  138  134   Potassium 3.5 - 5.1 mmol/L 4.5  5.3  5.2   Chloride 98 - 111 mmol/L 108  111  110   CO2 22 - 32 mmol/L Calcium 8.9 - 10.3 mg/dL 8.8  9.4  8.8   Total Protein 6.5 - 8.1 g/dL 6.9  6.8    Total Bilirubin 0.3 - 1.2 mg/dL 0.5  0.7    Alkaline Phos 38 - 126 U/L 82  70    AST 15 - 41 U/L 38  35    ALT 0 - 44 U/L 49  46        RADIOGRAPHIC STUDIES: I have personally reviewed the radiological images as listed and agreed with the findings in the report. PCV ECHOCARDIOGRAM COMPLETE  Result Date: 05/16/2022 Images from the original result were not included. Reason for Visit  INDICATIONS:   The patient is Z00.00. Echocardiogram: An echocardiogram in (2-d) mode was performed and in Doppler mode with color flow velocity mapping was performed. ventricular septum thickness 1.23 cm, L ventricular posterior wall thickness (diastole) 1.22 cm, left atrium size 4.2 cm, aortic root diameter 3.6 cm, L ventricle diastolic dimension 4.38 cm, L ventricle systolic dimension 3.12, L ventricle ejection fraction 55.6 %, and LV fractional shortening 28.8 % L ventricular outflow tract internal diameter 3.2 cm, L ventricular outflow tract flow velocity 1.38 m/s, aortic valve cusps 1.7 cm , aortic valve flow velocity 1.71 (m/sec), aortic valve systolic calculated mean flow gradient 8 mmHg, mitral valve diastolic peak flow velocity E 5.78 m/sec, and mitral valve diastolic peak flow E/A ratio 3.2 % Mitral valve has trace regurgitation Tricuspid valve has trace regurgitation ASSESSMENT Technically adequate study. Mild left ventricular hypertrophy with GRADE 2 (psuedonormalization)  diastolic dysfunction. Normal right ventricular systolic function. Normal right ventricular diastolic function. Normal left ventricular wall motion. Normal right ventricular wall motion. Trace tricuspid regurgitation. Mild pulmonary hypertension. Trace mitral regurgitation. No pericardial effusion. Mildly dilated Left atrium Mild LVH

## 2022-07-19 NOTE — Assessment & Plan Note (Addendum)
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  S/p chemotherapy Xeloda /m2 BID+ concurrent RT Previously on adjuvant Xeloda [1000 mg/m2 BID for 14 of every 21 days] -dose reduced to /m2 BID due to skin rash, still not able to tolerate- discontinue.  switched to gemcitabine weekly, 2 weeks on, 1 week off, [adjustment due to cytopenia] Labs are reviewed and discussed with patient.  proceed with cycle 1 D8 gemcitabine on 07/12/22

## 2022-07-19 NOTE — Patient Instructions (Signed)
[facility]  Discharge Instructions: Thank you for choosing [facility] to provide your oncology and hematology care.  If you have a lab appointment with the Cancer Center, please go directly to the Cancer Center and check in at the registration area.  Wear comfortable clothing and clothing appropriate for easy access to any Portacath or PICC line.   We strive to give you quality time with your provider. You may need to reschedule your appointment if you arrive late (15 or more minutes).  Arriving late affects you and other patients whose appointments are after yours.  Also, if you miss three or more appointments without notifying the office, you may be dismissed from the clinic at the provider's discretion.      For prescription refill requests, have your pharmacy contact our office and allow 72 hours for refills to be completed.    Today you received the following chemotherapy and/or immunotherapy agents Gemzar       To help prevent nausea and vomiting after your treatment, we encourage you to take your nausea medication as directed.  BELOW ARE SYMPTOMS THAT SHOULD BE REPORTED IMMEDIATELY: *FEVER GREATER THAN 100.4 F (38 C) OR HIGHER *CHILLS OR SWEATING *NAUSEA AND VOMITING THAT IS NOT CONTROLLED WITH YOUR NAUSEA MEDICATION *UNUSUAL SHORTNESS OF BREATH *UNUSUAL BRUISING OR BLEEDING *URINARY PROBLEMS (pain or burning when urinating, or frequent urination) *BOWEL PROBLEMS (unusual diarrhea, constipation, pain near the anus) TENDERNESS IN MOUTH AND THROAT WITH OR WITHOUT PRESENCE OF ULCERS (sore throat, sores in mouth, or a toothache) UNUSUAL RASH, SWELLING OR PAIN  UNUSUAL VAGINAL DISCHARGE OR ITCHING   Items with * indicate a potential emergency and should be followed up as soon as possible or go to the Emergency Department if any problems should occur.  Please show the CHEMOTHERAPY ALERT CARD or IMMUNOTHERAPY ALERT CARD at check-in to  the Emergency Department and triage nurse.  Should you have questions after your visit or need to cancel or reschedule your appointment, please contact [facility]  [phone] and follow the prompts.  Office hours are 8:00 a.m. to 4:30 p.m. Monday - Friday. Please note that voicemails left after 4:00 p.m. may not be returned until the following business day.  We are closed weekends and major holidays. You have access to a nurse at all times for urgent questions. Please call the main number to the clinic [phone] and follow the prompts.  For any non-urgent questions, you may also contact your provider using MyChart. We now offer e-Visits for anyone 18 and older to request care online for non-urgent symptoms. For details visit [email or web address].   Also download the MyChart app! Go to the app store, search "MyChart", open the app, select [facility], and log in with your MyChart username and password.    

## 2022-07-20 ENCOUNTER — Other Ambulatory Visit (HOSPITAL_COMMUNITY): Payer: Self-pay

## 2022-07-21 ENCOUNTER — Other Ambulatory Visit: Payer: Self-pay | Admitting: Oncology

## 2022-07-24 ENCOUNTER — Other Ambulatory Visit: Payer: Self-pay

## 2022-07-24 ENCOUNTER — Inpatient Hospital Stay: Payer: 59

## 2022-07-24 ENCOUNTER — Inpatient Hospital Stay
Admission: EM | Admit: 2022-07-24 | Discharge: 2022-07-26 | DRG: 809 | Disposition: A | Discharge status: Home / Self Care | Payer: 59 | Attending: Internal Medicine | Admitting: Internal Medicine

## 2022-07-24 ENCOUNTER — Encounter: Payer: Self-pay | Admitting: Internal Medicine

## 2022-07-24 DIAGNOSIS — Z85820 Personal history of malignant melanoma of skin: Secondary | ICD-10-CM | POA: Diagnosis not present

## 2022-07-24 DIAGNOSIS — G4733 Obstructive sleep apnea (adult) (pediatric): Secondary | ICD-10-CM | POA: Diagnosis present

## 2022-07-24 DIAGNOSIS — D701 Agranulocytosis secondary to cancer chemotherapy: Secondary | ICD-10-CM | POA: Diagnosis not present

## 2022-07-24 DIAGNOSIS — Z951 Presence of aortocoronary bypass graft: Secondary | ICD-10-CM

## 2022-07-24 DIAGNOSIS — E785 Hyperlipidemia, unspecified: Secondary | ICD-10-CM | POA: Diagnosis not present

## 2022-07-24 DIAGNOSIS — R0600 Dyspnea, unspecified: Secondary | ICD-10-CM | POA: Diagnosis not present

## 2022-07-24 DIAGNOSIS — I10 Essential (primary) hypertension: Secondary | ICD-10-CM | POA: Diagnosis present

## 2022-07-24 DIAGNOSIS — E669 Obesity, unspecified: Secondary | ICD-10-CM | POA: Diagnosis present

## 2022-07-24 DIAGNOSIS — Z7982 Long term (current) use of aspirin: Secondary | ICD-10-CM

## 2022-07-24 DIAGNOSIS — D6481 Anemia due to antineoplastic chemotherapy: Secondary | ICD-10-CM | POA: Diagnosis not present

## 2022-07-24 DIAGNOSIS — Z96642 Presence of left artificial hip joint: Secondary | ICD-10-CM | POA: Diagnosis present

## 2022-07-24 DIAGNOSIS — E875 Hyperkalemia: Secondary | ICD-10-CM | POA: Diagnosis not present

## 2022-07-24 DIAGNOSIS — Z7901 Long term (current) use of anticoagulants: Secondary | ICD-10-CM | POA: Diagnosis not present

## 2022-07-24 DIAGNOSIS — I129 Hypertensive chronic kidney disease with stage 1 through stage 4 chronic kidney disease, or unspecified chronic kidney disease: Secondary | ICD-10-CM | POA: Diagnosis not present

## 2022-07-24 DIAGNOSIS — Z1152 Encounter for screening for COVID-19: Secondary | ICD-10-CM | POA: Diagnosis not present

## 2022-07-24 DIAGNOSIS — D631 Anemia in chronic kidney disease: Secondary | ICD-10-CM | POA: Diagnosis not present

## 2022-07-24 DIAGNOSIS — Z950 Presence of cardiac pacemaker: Secondary | ICD-10-CM

## 2022-07-24 DIAGNOSIS — I442 Atrioventricular block, complete: Secondary | ICD-10-CM | POA: Diagnosis not present

## 2022-07-24 DIAGNOSIS — F419 Anxiety disorder, unspecified: Secondary | ICD-10-CM | POA: Diagnosis present

## 2022-07-24 DIAGNOSIS — D61810 Antineoplastic chemotherapy induced pancytopenia: Principal | ICD-10-CM | POA: Diagnosis present

## 2022-07-24 DIAGNOSIS — C779 Secondary and unspecified malignant neoplasm of lymph node, unspecified: Secondary | ICD-10-CM | POA: Diagnosis not present

## 2022-07-24 DIAGNOSIS — D649 Anemia, unspecified: Principal | ICD-10-CM | POA: Diagnosis present

## 2022-07-24 DIAGNOSIS — Z6839 Body mass index (BMI) 39.0-39.9, adult: Secondary | ICD-10-CM

## 2022-07-24 DIAGNOSIS — R569 Unspecified convulsions: Secondary | ICD-10-CM | POA: Diagnosis not present

## 2022-07-24 DIAGNOSIS — I2510 Atherosclerotic heart disease of native coronary artery without angina pectoris: Secondary | ICD-10-CM | POA: Diagnosis not present

## 2022-07-24 DIAGNOSIS — Z85828 Personal history of other malignant neoplasm of skin: Secondary | ICD-10-CM

## 2022-07-24 DIAGNOSIS — R55 Syncope and collapse: Secondary | ICD-10-CM | POA: Diagnosis not present

## 2022-07-24 DIAGNOSIS — Z79899 Other long term (current) drug therapy: Secondary | ICD-10-CM | POA: Diagnosis not present

## 2022-07-24 DIAGNOSIS — I700 Atherosclerosis of aorta: Secondary | ICD-10-CM | POA: Diagnosis not present

## 2022-07-24 DIAGNOSIS — Z886 Allergy status to analgesic agent status: Secondary | ICD-10-CM

## 2022-07-24 DIAGNOSIS — K922 Gastrointestinal hemorrhage, unspecified: Secondary | ICD-10-CM | POA: Diagnosis not present

## 2022-07-24 DIAGNOSIS — D61818 Other pancytopenia: Secondary | ICD-10-CM | POA: Diagnosis not present

## 2022-07-24 DIAGNOSIS — Z888 Allergy status to other drugs, medicaments and biological substances status: Secondary | ICD-10-CM

## 2022-07-24 DIAGNOSIS — C221 Intrahepatic bile duct carcinoma: Secondary | ICD-10-CM | POA: Diagnosis not present

## 2022-07-24 DIAGNOSIS — T451X5A Adverse effect of antineoplastic and immunosuppressive drugs, initial encounter: Secondary | ICD-10-CM | POA: Diagnosis not present

## 2022-07-24 DIAGNOSIS — Z809 Family history of malignant neoplasm, unspecified: Secondary | ICD-10-CM

## 2022-07-24 DIAGNOSIS — I25118 Atherosclerotic heart disease of native coronary artery with other forms of angina pectoris: Secondary | ICD-10-CM | POA: Diagnosis present

## 2022-07-24 DIAGNOSIS — N1831 Chronic kidney disease, stage 3a: Secondary | ICD-10-CM | POA: Diagnosis not present

## 2022-07-24 DIAGNOSIS — Z923 Personal history of irradiation: Secondary | ICD-10-CM

## 2022-07-24 LAB — BASIC METABOLIC PANEL
Anion gap: 5 (ref 5–15)
BUN: 36 mg/dL — ABNORMAL HIGH (ref 8–23)
CO2: 23 mmol/L (ref 22–32)
Calcium: 9 mg/dL (ref 8.9–10.3)
Chloride: 111 mmol/L (ref 98–111)
Creatinine, Ser: 1.61 mg/dL — ABNORMAL HIGH (ref 0.61–1.24)
GFR, Estimated: 48 mL/min — ABNORMAL LOW (ref >60)
Glucose, Bld: 115 mg/dL — ABNORMAL HIGH (ref 70–99)
Potassium: 5.4 mmol/L — ABNORMAL HIGH (ref 3.5–5.1)
Sodium: 139 mmol/L (ref 135–145)

## 2022-07-24 LAB — BPAM RBC
ISSUE DATE / TIME: 202404291808
Unit Type and Rh: 600

## 2022-07-24 LAB — TYPE AND SCREEN

## 2022-07-24 LAB — CBC
HCT: 21.2 % — ABNORMAL LOW (ref 39.0–52.0)
Hemoglobin: 7 g/dL — ABNORMAL LOW (ref 13.0–17.0)
MCH: 34.1 pg — ABNORMAL HIGH (ref 26.0–34.0)
MCHC: 33 g/dL (ref 30.0–36.0)
MCV: 103.4 fL — ABNORMAL HIGH (ref 80.0–100.0)
Platelets: 102 10*3/uL — ABNORMAL LOW (ref 150–400)
RBC: 2.05 MIL/uL — ABNORMAL LOW (ref 4.22–5.81)
RDW: 14.3 % (ref 11.5–15.5)
WBC: 2.1 10*3/uL — ABNORMAL LOW (ref 4.0–10.5)
nRBC: 0 % (ref 0.0–0.2)

## 2022-07-24 LAB — PREPARE RBC (CROSSMATCH)

## 2022-07-24 MED ORDER — ONDANSETRON HCL 4 MG/2ML IJ SOLN: 4.0000 mg | Freq: Four times a day (QID) | INTRAMUSCULAR | Status: DC | PRN

## 2022-07-24 MED ORDER — ATORVASTATIN CALCIUM 20 MG PO TABS
20.0000 mg | ORAL_TABLET | Freq: Every day | ORAL | Status: DC
  Administered 2022-07-24 – 2022-07-25 (×2): 20 mg via ORAL
  Filled 2022-07-24 (×2): qty 1

## 2022-07-24 MED ORDER — ONDANSETRON HCL 4 MG PO TABS: 4.0000 mg | ORAL_TABLET | Freq: Four times a day (QID) | ORAL | Status: DC | PRN

## 2022-07-24 MED ORDER — LACOSAMIDE 50 MG PO TABS
100.0000 mg | ORAL_TABLET | Freq: Two times a day (BID) | ORAL | Status: DC
  Administered 2022-07-24 – 2022-07-26 (×4): 100 mg via ORAL
  Filled 2022-07-24 (×4): qty 2

## 2022-07-24 MED ORDER — ACETAMINOPHEN 325 MG PO TABS: 650.0000 mg | ORAL_TABLET | Freq: Four times a day (QID) | ORAL | Status: DC | PRN

## 2022-07-24 MED ORDER — NYSTATIN 100000 UNIT/GM EX POWD: 1.0000 | Freq: Three times a day (TID) | CUTANEOUS | Status: DC | PRN

## 2022-07-24 MED ORDER — PANTOPRAZOLE SODIUM 40 MG IV SOLR: 40.0000 mg | Freq: Two times a day (BID) | INTRAVENOUS | Status: DC

## 2022-07-24 MED ORDER — ADULT MULTIVITAMIN W/MINERALS CH
1.0000 | ORAL_TABLET | Freq: Every day | ORAL | Status: DC
  Administered 2022-07-25 – 2022-07-26 (×2): 1 via ORAL
  Filled 2022-07-24 (×2): qty 1

## 2022-07-24 MED ORDER — ACETAMINOPHEN 650 MG RE SUPP: 650.0000 mg | Freq: Four times a day (QID) | RECTAL | Status: DC | PRN

## 2022-07-24 MED ORDER — LISINOPRIL 10 MG PO TABS
10.0000 mg | ORAL_TABLET | Freq: Every day | ORAL | Status: DC
  Filled 2022-07-24 (×2): qty 1

## 2022-07-24 MED ORDER — SODIUM CHLORIDE 0.9 % IV SOLN
10.0000 mL/h | Freq: Once | INTRAVENOUS | Status: AC
  Administered 2022-07-24: 10 mL/h via INTRAVENOUS

## 2022-07-24 MED ORDER — SENNOSIDES-DOCUSATE SODIUM 8.6-50 MG PO TABS: 1.0000 | ORAL_TABLET | Freq: Every evening | ORAL | Status: DC | PRN

## 2022-07-24 MED ORDER — PANTOPRAZOLE INFUSION (NEW) - SIMPLE MED
8.0000 mg/h | INTRAVENOUS | Status: DC
  Administered 2022-07-24: 8 mg/h via INTRAVENOUS
  Filled 2022-07-24 (×2): qty 100

## 2022-07-24 MED ORDER — PANTOPRAZOLE 80MG IVPB - SIMPLE MED
80.0000 mg | Freq: Once | INTRAVENOUS | Status: AC
  Administered 2022-07-24: 80 mg via INTRAVENOUS
  Filled 2022-07-24: qty 100

## 2022-07-24 MED ORDER — CLOTRIMAZOLE 1 % EX CREA
1.0000 | TOPICAL_CREAM | Freq: Two times a day (BID) | CUTANEOUS | Status: DC
  Administered 2022-07-24 – 2022-07-26 (×2): 1 via TOPICAL
  Filled 2022-07-24 (×2): qty 15

## 2022-07-24 MED ORDER — CLONAZEPAM 0.5 MG PO TABS
0.5000 mg | ORAL_TABLET | Freq: Two times a day (BID) | ORAL | Status: DC | PRN
  Administered 2022-07-25: 0.5 mg via ORAL
  Filled 2022-07-24: qty 1

## 2022-07-24 MED ORDER — METOPROLOL SUCCINATE ER 25 MG PO TB24
25.0000 mg | ORAL_TABLET | Freq: Every day | ORAL | Status: DC
  Administered 2022-07-26: 25 mg via ORAL
  Filled 2022-07-24 (×2): qty 1

## 2022-07-24 MED ORDER — LIDOCAINE-PRILOCAINE 2.5-2.5 % EX CREA: 1.0000 | TOPICAL_CREAM | CUTANEOUS | Status: DC | PRN

## 2022-07-24 NOTE — Assessment & Plan Note (Signed)
Mild, no EKG changes from baseline

## 2022-07-24 NOTE — Assessment & Plan Note (Addendum)
Patient is status postcardiac catheterization in 08/08/2021, and was recommended to have coronary artery bypass grafting due to significant progression of left main coronary artery disease. Patient is status post CABG on 08/10/2021 (LIMA to LAD, SVG to OM, SVG to RCA) Resumed home atorvastatin 20 mg nightly, lisinopril 10 mg daily, metoprolol succinate 25 mg daily Holding home aspirin and Eliquis AM team to resume aspirin and Eliquis when the benefits outweigh the risk

## 2022-07-24 NOTE — H&P (Addendum)
History and Physical   Edward Pitts GNF:[id number] DOB: 1960-09-14 DOA: 07/24/2022  PCP: Edward Kins, FNP  Outpatient Specialists: Dr. Cathie Pitts, medical oncology Patient coming from: home  I have personally briefly reviewed patient's old medical records in [facility] EMR.  Chief Concern: dizziness  HPI: 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  Patient was on the floor with his mother (who was hospitalized for fracture and surgery) when he developed dizziness upon picking up from something on the floor and standing up.  He asked the nurse to check his blood pressure, and he was found to have low blood pressure and was told to go to the emergency department for further evaluation.  Vitals in the ED showed temperature of 98.3, respiration rate of 16, heart rate of 68, blood pressure 132/54, SpO2 97% on room air.  Serum sodium is 138, potassium 5.4, chloride 111, bicarb 23, BUN of 36, serum creatinine of 1.61, EGFR 48, nonfasting blood glucose 115, WBC 2.1, hemoglobin 7.0, platelets of 102.  ED treatment: Patient was typed and screened, 1 unit PRBC have been ordered along with Protonix bolus and gtt. -------------------------- At bedside, patient was able to tell me his name, age, current location, current, year.  He reports that he was sitting in a chair in his mother's and he stood up grabbed his jacket, placed to check it down onto another chair when he began to feel room spinning.  He denies trauma to his person.  He reports that shortness of breath with exertion since May 2023 after open heart surgery.  He states that over the last week to 1 month, the shortness of breath with exertion has worsened.  He endorses generalized  fatigue and weakness.  He reports that he was previously on oral chemotherapy and was transitioned to IV chemotherapy, last IV chemotherapy dose was 07/17/2022.  He reports that over the last week he has been having dry heaves.  He denies vomiting.  He denies EtOH, NSAIDs use.  He reports that he avoids NSAIDs because he has had difficulty with his kidneys in the past from taking NSAIDs.  He denies BC powder, Goody powder use.  He denies known history of ulcers in his stomach.  He denies coffee-ground emesis, bright red blood vomitus.  He denies bright red blood per rectum.  He endorses intermittent constipation and denies diarrhea.  He reports that his stool has been black for the last 2 to 3 months however states that it has been black since he started iron tablets 2 to 3 months ago.  Social history: He lives at home with his wife.  He denies tobacco, EtOH, recreational drug use.  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  ROS: Constitutional: no weight change, no fever ENT/Mouth: no sore throat, no rhinorrhea Eyes: no eye pain, no vision changes Cardiovascular: no chest pain, + dyspnea,  no edema, no palpitations Respiratory: no cough, no sputum, no wheezing Gastrointestinal: no nausea, no vomiting, no diarrhea, no constipation Genitourinary: no urinary incontinence, no dysuria, no hematuria Musculoskeletal: no arthralgias, no myalgias Skin: no skin lesions, no pruritus, Neuro: + weakness, no loss of consciousness, no syncope, + dizziness Psych: no anxiety, no depression, no decrease appetite Heme/Lymph: no bruising, no bleeding  ED Course: Discussed with emergency medicine provider, patient requiring hospitalization for chief concerns of symptomatic anemia.  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)   Stage 3a chronic kidney disease (HCC)   Hyperkalemia   Benign hypertensive kidney disease with chronic kidney disease   S/P CABG x 3   Coronary artery disease   Cholangiocarcinoma (HCC)   Chemotherapy induced neutropenia (HCC)   Assessment and Plan:  * Symptomatic anemia Concerning for GI bleed, upper and/or lower in setting of Eliquis use On chronic anemia, baseline hemoglobin is 9.3-11 Status post type and screen and 1 unit PRBC for transfusion ordered by EDP, continue Protonix bolus and GGT Hold Eliquis and 81 mg aspirin on admission due to concern for symptomatic anemia and upper GI bleed.  PIV: ensure and maintain two PIV (prefer large bore) for upper GI bleed order place Gastroenterology has been consulted via epic order and secure chat, Dr. Allegra Lai states patient will be seen Admit to inpatient, telemetry cardiac  Stage 3a chronic kidney disease (HCC) At baseline  HLD (hyperlipidemia) Atorvastatin 20 mg nightly resumed  HTN (hypertension) Lisinopril 10 mg daily, metoprolol succinate 25 mg daily resumed for 07/25/2022  Hyperkalemia Mild, no EKG changes from baseline  S/P CABG x 3 Patient is status postcardiac catheterization in 08/08/2021, and was recommended to have coronary artery bypass grafting due to significant progression of left main coronary artery disease. Patient is status post CABG on 08/10/2021 (LIMA to LAD, SVG to OM, SVG to RCA) Resumed home atorvastatin 20 mg nightly, lisinopril 10 mg daily, metoprolol succinate 25 mg daily Holding home aspirin and Eliquis AM team to resume aspirin and Eliquis when the benefits outweigh the risk  Chart reviewed.   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   Family history of adverse reaction to anesthesia    PONV mother   GERD (gastroesophageal reflux disease)    History of complete heart block    PPM placed   Hyperlipidemia    Hypertension    LBBB (left bundle branch block)    Lymphedema of left leg    uses thigh high compression stockings   Melanoma (HCC) 2012   skin cancer, left thigh   OSA on CPAP    PONV (postoperative nausea and vomiting) 04/16/2019   Port-A-Cath in place    RIGHT chest wall   Presence of cardiac pacemaker    [employer]   Seizures (HCC)    still has episodes of dizziness. last event 1 month ago (march 2022) and will pass out. takes clonazepam   Past Surgical History:  Procedure Laterality Date   CORONARY ARTERY BYPASS GRAFT N/A 08/10/2021   Procedure: CORONARY ARTERY BYPASS GRAFTING (  CABG) X 3 USING LEFT INTERNAL MAMMARY ARTERY AND RIGHT GREATER SAPHENOUS VEIN;  Surgeon: Lovett Sox, MD;  Location: [facility] OR;  Service: Open Heart Surgery;  Laterality: N/A;   CT RADIATION THERAPY GUIDE     left groin   dental implant     permanent implant   ENDOVEIN HARVEST OF GREATER SAPHENOUS VEIN Right 08/10/2021   Procedure: ENDOVEIN HARVEST OF GREATER SAPHENOUS VEIN;  Surgeon: Lovett Sox, MD;  Location: [facility] OR;  Service: Open Heart Surgery;  Laterality: Right;   KNEE SURGERY Left    arthroscopy   LEFT HEART CATH AND CORONARY ANGIOGRAPHY Left 06/29/2017   Procedure: LEFT HEART CATH AND CORONARY ANGIOGRAPHY;  Surgeon: Lamar Blinks, MD;  Location: [facility] INVASIVE CV LAB;  Service: Cardiovascular;  Laterality: Left;   LEFT HEART CATH AND CORONARY ANGIOGRAPHY N/A  08/08/2021   Procedure: LEFT HEART CATH AND CORONARY ANGIOGRAPHY;  Surgeon: Lamar Blinks, MD;  Location: [facility] INVASIVE CV LAB;  Service: Cardiovascular;  Laterality: N/A;   LYMPH NODE DISSECTION Left 04/16/2019   Procedure: Left inguinal Lymph Node Dissection;  Surgeon: Almond Lint, MD;  Location: [facility] OR;  Service: General;  Laterality: Left;   MELANOMA EXCISION Left 04/16/2019   Procedure: MELANOMA EXCISION LEFT GROIN MASS;  Surgeon: Almond Lint, MD;  Location: [facility] OR;  Service: General;  Laterality: Left;   MELANOMA EXCISION WITH SENTINEL LYMPH NODE BIOPSY Left 2012   Left calf    PACEMAKER INSERTION N/A 08/26/2018   Procedure: INSERTION PACEMAKER;  Surgeon: Marcina Millard, MD;  Location: [facility] ORS;  Service: Cardiovascular;  Laterality: N/A;   PORTA CATH INSERTION N/A 08/26/2019   Procedure: PORTA CATH INSERTION;  Surgeon: Renford Dills, MD;  Location: [facility] INVASIVE CV LAB;  Service: Cardiovascular;  Laterality: N/A;   SUPERFICIAL LYMPH NODE BIOPSY / EXCISION Left 2020   lymph nodes removed around left groin melanoma site   TEE WITHOUT CARDIOVERSION N/A 08/10/2021   Procedure: TRANSESOPHAGEAL ECHOCARDIOGRAM (TEE);  Surgeon: Lovett Sox, MD;  [facility];  Service: Open Heart Surgery;  Laterality: N/A;   TEMPORARY PACEMAKER N/A 08/25/2018   Procedure: TEMPORARY PACEMAKER;  Surgeon: Tonny Bollman, MD;  Location: [facility] INVASIVE CV LAB;  Service: Cardiovascular;  Laterality: N/A;   TOTAL HIP ARTHROPLASTY Left 07/14/2020   Procedure: TOTAL HIP ARTHROPLASTY;  Surgeon: Donato Heinz, MD;  Location: [facility] ORS;  Service: Orthopedics;  Laterality: Left;   Social History:  reports that he has never smoked. He has never used smokeless tobacco. He reports that he does not drink alcohol and does not use drugs.  Allergies  Allergen Reactions   Ibuprofen Other (See Comments)    Affects kidneys   Levetiracetam     Other reaction(s): Dizziness, Headache   Nsaids     Affects kidneys    Capecitabine Rash   Family History  Problem Relation Age of Onset   Cancer Paternal Grandmother    Family history: Family history reviewed and not pertinent  Prior to Admission medications   Medication Sig Start Date End Date Taking? Authorizing Provider  apixaban (ELIQUIS) 5 MG TABS tablet Take 5 mg by mouth 2 (two) times daily.    [provider]  aspirin 81 MG EC tablet Take 81 mg by mouth daily.    [provider]  atorvastatin (LIPITOR) 20 MG tablet Take 20 mg by mouth daily. 07/15/18   [provider]  capecitabine (XELODA) 500 MG tablet Take 4 tablets (2,000 mg total) by mouth 2 (two) times daily  after a meal. Take for 14 days, then hold for 7 days. Repeat every 21 days. Patient not taking: Reported on 07/10/2022 06/29/22   Rickard Patience, MD  Carboxymeth-Glyc-Polysorb PF (REFRESH OPTIVE MEGA-3) 0.5-1-0.5 % SOLN Place 1 drop into both eyes daily as needed (dry eyes).    [provider]  clonazePAM (KLONOPIN) 0.5 MG tablet TAKE 1 TABLET BY MOUTH IN THE MORNING AND 1 BY MOUTH IN THE EVENING per pt 06/09/22   Vaslow, Georgeanna Lea, MD  clotrimazole (CLOTRIMAZOLE ANTI-FUNGAL) 1 % cream Apply 1 Application topically 2 (two) times daily. Apply to the L inguinal fold 06/07/22   Georga Hacking, MD  febuxostat (ULORIC) 40 MG tablet Take 40 mg by mouth daily. 11/03/21   [provider]  ferrous sulfate 325 (65 FE) MG EC tablet Take 1 tablet (325 mg total) by mouth daily. 07/19/22   Rickard Patience, MD  hydrocortisone 2.5 % ointment Apply 1 application. topically daily as needed (itching).    [provider]  Lacosamide 100 MG TABS Take 1 tablet (100 mg total) by mouth in the morning and at bedtime. 06/09/22   Henreitta Leber, MD  lidocaine-prilocaine (EMLA) cream Apply 1 application. topically as needed. Apply small amount of cream to port site approx 1-2 hours prior to appointment. 07/21/21   Rickard Patience, MD  lisinopril (ZESTRIL) 10 MG tablet Take 1 tablet by mouth  daily. 04/06/22   [provider]  metoprolol succinate (TOPROL-XL) 25 MG 24 hr tablet Take 25 mg by mouth daily. 12/15/18   [provider]  Multiple Vitamin (MULTIVITAMIN WITH MINERALS) TABS tablet Take 1 tablet by mouth daily. Centrum Silver    [provider]  nystatin (MYCOSTATIN/NYSTOP) powder Apply 1 Application topically 3 (three) times daily. 06/14/22   Rickard Patience, MD  omeprazole (PRILOSEC) 20 MG capsule TAKE 1 CAPSULE BY MOUTH EVERY DAY 07/21/22   Rickard Patience, MD  ondansetron (ZOFRAN) 4 MG tablet TAKE 1 TABLET BY MOUTH EVERY 8 HOURS AS NEEDED FOR NAUSEA AND VOMITING 01/26/22   Borders, Daryl Eastern, NP  traZODone (DESYREL) 50 MG tablet TAKE 1 TABLET BY MOUTH EVERY DAY AT BEDTIME AS NEEDED FOR SLEEP Patient not taking: Reported on 07/19/2022 05/09/22   Rickard Patience, MD  zinc oxide 20 % ointment Apply 1 Application topically as needed for irritation. 06/07/22   Georga Hacking, MD   Physical Exam: Vitals:   07/24/22 1321 07/24/22 1627 07/24/22 1700 07/24/22 1822  BP: (!) 127/59 (!) 118/46 (!) 116/49 (!) 121/41  Pulse: 63 63 68 61  Resp: 16 (!) 21 (!) 26 20  Temp:  98.3 F (36.8 C)  98.2 F (36.8 C)  TempSrc:  Oral  Oral  SpO2: 100% 99% 96% 95%  Weight:      Height:       Constitutional: appears age-appropriate, NAD, calm Eyes: PERRL, lids and conjunctivae normal ENMT: Mucous membranes are moist. Posterior pharynx clear of any exudate or lesions. Age-appropriate dentition. Hearing appropriate Neck: normal, supple, no masses, no thyromegaly Respiratory: clear to auscultation bilaterally, no wheezing, no crackles. Normal respiratory effort. No accessory muscle use.  Cardiovascular: Regular rate and rhythm, no murmurs / rubs / gallops. No extremity edema. 2+ pedal pulses. No carotid bruits.  Abdomen: obese abdomen, no tenderness, no masses palpated, no hepatosplenomegaly. Bowel sounds positive.  Musculoskeletal: no clubbing / cyanosis. No joint deformity upper and lower  extremities. Good ROM, no contractures, no atrophy. Normal muscle tone.  Skin: no rashes, lesions, ulcers. No  induration.  Pale skin Neurologic: Sensation intact. Strength 5/5 in all 4.  Psychiatric: Normal judgment and insight. Alert and oriented x 3. Normal mood.   EKG: independently reviewed, showing ventricular paced, rate of 66, QTc 515  Chest x-ray on Admission: I personally reviewed and I agree with radiologist reading as below.  DG Chest Port 1 View  Result Date: 07/24/2022 CLINICAL DATA:  Dyspnea on exertion EXAM: PORTABLE CHEST 1 VIEW COMPARISON:  Chest x-ray 02/21/2022 FINDINGS: Right chest port catheter tip projects over the SVC. Left-sided pacemaker again noted. Sternotomy wires and mediastinal clips are present. The cardiomediastinal silhouette is within normal limits. The lungs are clear. There is no pleural effusion or pneumothorax. No acute fractures are seen. IMPRESSION: No active disease. Electronically Signed   By: Darliss Cheney M.D.   On: 07/24/2022 17:47    Labs on Admission: I have personally reviewed following labs  CBC: Recent Labs  Lab 07/19/22 0903 07/24/22 1040  WBC 2.4* 2.1*  NEUTROABS 1.6*  --   HGB 8.2* 7.0*  HCT 23.6* 21.2*  MCV 101.3* 103.4*  PLT 112* 102*   Basic Metabolic Panel: Recent Labs  Lab 07/19/22 0903 07/24/22 1040  NA 135 139  K 4.5 5.4*  CL 108 111  CO2 19* 23  GLUCOSE 145* 115*  BUN 36* 36*  CREATININE 1.56* 1.61*  CALCIUM 8.8* 9.0   GFR: Estimated Creatinine Clearance: 57.2 mL/min (A) (by C-G formula based on SCr of 1.61 mg/dL (H)).  Liver Function Tests: Recent Labs  Lab 07/19/22 0903  AST 38  ALT 49*  ALKPHOS 82  BILITOT 0.5  PROT 6.9  ALBUMIN 3.9   Urine analysis:    Component Value Date/Time   COLORURINE YELLOW 08/09/2021 0021   APPEARANCEUR CLEAR 08/09/2021 0021   LABSPEC 1.013 08/09/2021 0021   PHURINE 5.0 08/09/2021 0021   GLUCOSEU NEGATIVE 08/09/2021 0021   HGBUR NEGATIVE 08/09/2021 0021    BILIRUBINUR NEGATIVE 08/09/2021 0021   KETONESUR NEGATIVE 08/09/2021 0021   PROTEINUR NEGATIVE 08/09/2021 0021   NITRITE NEGATIVE 08/09/2021 0021   LEUKOCYTESUR NEGATIVE 08/09/2021 0021   This document was prepared using Dragon Voice Recognition software and may include unintentional dictation errors.  Dr. Sedalia Muta [facility]  If 7PM-7AM, please contact overnight-coverage provider If 7AM-7PM, please contact day attending provider [email or web address]  07/24/2022, 6:25 PM

## 2022-07-24 NOTE — Assessment & Plan Note (Signed)
Lisinopril 10 mg daily, metoprolol succinate 25 mg daily resumed for 07/25/2022

## 2022-07-24 NOTE — Hospital Course (Addendum)
Mr. 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  Patient was on the floor with his mother (who was hospitalized for fracture and surgery) when he developed dizziness upon picking up from something on the floor and standing up.  He asked the nurse to check his blood pressure, and he was found to have low blood pressure and was told to go to the emergency department for further evaluation.  Vitals in the ED showed temperature of 98.3, respiration rate of 16, heart rate of 68, blood pressure 132/54, SpO2 97% on room air.  Serum sodium is 138, potassium 5.4, chloride 111, bicarb 23, BUN of 36, serum creatinine of 1.61, EGFR 48, nonfasting blood glucose 115, WBC 2.1, hemoglobin 7.0, platelets of 102.  ED treatment: Patient was typed and screened, 1 unit PRBC have been ordered along with Protonix bolus and gtt.

## 2022-07-24 NOTE — ED Notes (Addendum)
Pt states that they were upstairs with their mom and had picked something up to put in a chair and that is when the dizziness started. Pt is concerned about the amount of time they'd be spending in the ED because mother is having surgery at 1400 today. Pt states that they take iron daily. Pt states he had open heart surgery in May of last year and has been experiencing SOB since then.

## 2022-07-24 NOTE — Assessment & Plan Note (Signed)
-   Atorvastatin 20 mg nightly resumed 

## 2022-07-24 NOTE — ED Triage Notes (Signed)
Pt to ED for dizziness that started an hour ago. Reports is cancer pt and hgb has been low.  Pt in NAD

## 2022-07-24 NOTE — ED Provider Notes (Signed)
[facility] Provider Note    Event Date/Time   First MD Initiated Contact with Patient 07/24/22 1241     (approximate)   History   Dizziness   HPI  Edward Pitts is a 62 y.o. male who presents to the emergency department today because of concerns for a near syncopal episode.  The patient was visiting his mother in the hospital when he reached down and stood back up and got very dizzy.  He did not actually pass out.  Patient says that he has any history of low blood levels is followed by oncology.  Was told that if his blood level continued to drop he might need a transfusion.  He denies noticing any blood in his stool although has taken some iron pills so it is black at times.  Denies any chest pain.  Denies any fevers.     Physical Exam   Triage Vital Signs: ED Triage Vitals  Enc Vitals Group     BP 07/24/22 1040 (!) 132/52     Pulse Rate 07/24/22 1038 68     Resp 07/24/22 1038 16     Temp 07/24/22 1038 98.3 F (36.8 C)     Temp src --      SpO2 07/24/22 1038 97 %     Weight 07/24/22 1039 250 lb (113.4 kg)     Height 07/24/22 1039 5\' 7"  (1.702 m)     Head Circumference --      Peak Flow --      Pain Score 07/24/22 1039 0     Pain Loc --      Pain Edu? --      Excl. in GC? --     Most recent vital signs: Vitals:   07/24/22 1038 07/24/22 1040  BP:  (!) 132/52  Pulse: 68   Resp: 16   Temp: 98.3 F (36.8 C)   SpO2: 97%    General: Awake, alert, oriented. CV:  Good peripheral perfusion. Regular rate and rhythm. Resp:  Normal effort. Lungs clear. Abd:  No distention.  Other:  Pale mucosa.  Rectal:  Brown stool on glove. GUIAC positive.   ED Results / Procedures / Treatments   Labs (all labs ordered are listed, but only abnormal results are displayed) Labs Reviewed  CBC - Abnormal; Notable for the following components:      Result Value   WBC 2.1 (*)    RBC 2.05 (*)    Hemoglobin 7.0 (*)    HCT 21.2 (*)    MCV 103.4 (*)    MCH  34.1 (*)    Platelets 102 (*)    All other components within normal limits  BASIC METABOLIC PANEL - Abnormal; Notable for the following components:   Potassium 5.4 (*)    Glucose, Bld 115 (*)    BUN 36 (*)    Creatinine, Ser 1.61 (*)    GFR, Estimated 48 (*)    All other components within normal limits     EKG  I, Phineas Semen, attending physician, personally viewed and interpreted this EKG  EKG Time: 1056 Rate: 66 Rhythm: ventricular paced rhythm. Axis: left axis deviation Intervals: qtc 515 QRS: wide  ST changes: no st elevation Impression: abnormal ekg   RADIOLOGY None  PROCEDURES:  Critical Care performed: Yes  CRITICAL CARE Performed by: Phineas Semen   Total critical care time: 30 minutes  Critical care time was exclusive of separately billable procedures and treating other patients.  Critical care was necessary to treat or prevent imminent or life-threatening deterioration.  Critical care was time spent personally by me on the following activities: development of treatment plan with patient and/or surrogate as well as nursing, discussions with consultants, evaluation of patient's response to treatment, examination of patient, obtaining history from patient or surrogate, ordering and performing treatments and interventions, ordering and review of laboratory studies, ordering and review of radiographic studies, pulse oximetry and re-evaluation of patient's condition.   Procedures    MEDICATIONS ORDERED IN ED: Medications - No data to display   IMPRESSION / MDM / ASSESSMENT AND PLAN / ED COURSE  I reviewed the triage vital signs and the nursing notes.                              Differential diagnosis includes, but is not limited to, GI bleed, anemia of chronic disease, destructive disease  Patient's presentation is most consistent with acute presentation with potential threat to life or bodily function.   The patient is on the cardiac monitor  to evaluate for evidence of arrhythmia and/or significant heart rate changes.  Patient presented to the emergency department today because of concerns for a near syncopal episode while visiting his mother in the hospital.  On exam patient is quite pale.  Blood work is significant for hemoglobin of 7.0.  Rectal exam was performed and was guaiac positive.  Did discuss and consent patient for blood transfusion.  Will start IV Protonix.  Discussed with Dr. Sedalia Muta with the hospitalist service will plan on admission.      FINAL CLINICAL IMPRESSION(S) / ED DIAGNOSES   Final diagnoses:  Anemia, unspecified type  Gastrointestinal hemorrhage, unspecified gastrointestinal hemorrhage type      Note:  This document was prepared using Dragon voice recognition software and may include unintentional dictation errors.    Phineas Semen, MD 07/24/22 [phone]

## 2022-07-24 NOTE — Assessment & Plan Note (Signed)
At baseline 

## 2022-07-24 NOTE — ED Notes (Signed)
MD at bedside. 

## 2022-07-24 NOTE — Assessment & Plan Note (Addendum)
Concerning for GI bleed, upper and/or lower in setting of Eliquis use On chronic anemia, baseline hemoglobin is 9.3-11 Status post type and screen and 1 unit PRBC for transfusion ordered by EDP, continue Protonix bolus and GGT Hold Eliquis and 81 mg aspirin on admission due to concern for symptomatic anemia and upper GI bleed.  PIV: ensure and maintain two PIV (prefer large bore) for upper GI bleed order place Gastroenterology has been consulted via epic order and secure chat, Dr. Allegra Lai states patient will be seen Admit to inpatient, telemetry cardiac

## 2022-07-25 ENCOUNTER — Telehealth: Payer: Self-pay | Admitting: *Deleted

## 2022-07-25 ENCOUNTER — Encounter: Payer: Self-pay | Admitting: Internal Medicine

## 2022-07-25 DIAGNOSIS — Z951 Presence of aortocoronary bypass graft: Secondary | ICD-10-CM

## 2022-07-25 DIAGNOSIS — T451X5A Adverse effect of antineoplastic and immunosuppressive drugs, initial encounter: Secondary | ICD-10-CM

## 2022-07-25 DIAGNOSIS — N1831 Chronic kidney disease, stage 3a: Secondary | ICD-10-CM

## 2022-07-25 DIAGNOSIS — C221 Intrahepatic bile duct carcinoma: Secondary | ICD-10-CM | POA: Diagnosis not present

## 2022-07-25 DIAGNOSIS — D649 Anemia, unspecified: Secondary | ICD-10-CM | POA: Diagnosis not present

## 2022-07-25 DIAGNOSIS — D701 Agranulocytosis secondary to cancer chemotherapy: Secondary | ICD-10-CM | POA: Diagnosis not present

## 2022-07-25 LAB — BPAM RBC
Blood Product Expiration Date: 202405242359
Unit Type and Rh: 6200

## 2022-07-25 LAB — CBC WITH DIFFERENTIAL/PLATELET
Abs Immature Granulocytes: 0.01 10*3/uL (ref 0.00–0.07)
Basophils Absolute: 0 10*3/uL (ref 0.0–0.1)
Basophils Relative: 0 %
Eosinophils Absolute: 0 10*3/uL (ref 0.0–0.5)
Eosinophils Relative: 2 %
HCT: 22.3 % — ABNORMAL LOW (ref 39.0–52.0)
Hemoglobin: 7.4 g/dL — ABNORMAL LOW (ref 13.0–17.0)
Immature Granulocytes: 1 %
Lymphocytes Relative: 19 %
Lymphs Abs: 0.2 10*3/uL — ABNORMAL LOW (ref 0.7–4.0)
MCH: 33.6 pg (ref 26.0–34.0)
MCHC: 33.2 g/dL (ref 30.0–36.0)
MCV: 101.4 fL — ABNORMAL HIGH (ref 80.0–100.0)
Monocytes Absolute: 0 10*3/uL — ABNORMAL LOW (ref 0.1–1.0)
Monocytes Relative: 3 %
Neutro Abs: 0.9 10*3/uL — ABNORMAL LOW (ref 1.7–7.7)
Neutrophils Relative %: 75 %
Platelets: 76 10*3/uL — ABNORMAL LOW (ref 150–400)
RBC: 2.2 MIL/uL — ABNORMAL LOW (ref 4.22–5.81)
RDW: 15.3 % (ref 11.5–15.5)
Smear Review: NORMAL
WBC: 1.2 10*3/uL — CL (ref 4.0–10.5)
nRBC: 0 % (ref 0.0–0.2)

## 2022-07-25 LAB — HEPATIC FUNCTION PANEL
ALT: 46 U/L — ABNORMAL HIGH (ref 0–44)
AST: 32 U/L (ref 15–41)
Albumin: 3.2 g/dL — ABNORMAL LOW (ref 3.5–5.0)
Alkaline Phosphatase: 69 U/L (ref 38–126)
Bilirubin, Direct: 0.2 mg/dL (ref 0.0–0.2)
Indirect Bilirubin: 0.9 mg/dL (ref 0.3–0.9)
Total Bilirubin: 1.1 mg/dL (ref 0.3–1.2)
Total Protein: 6.1 g/dL — ABNORMAL LOW (ref 6.5–8.1)

## 2022-07-25 LAB — TYPE AND SCREEN
Antibody Screen: NEGATIVE
Unit division: 0

## 2022-07-25 LAB — BASIC METABOLIC PANEL
Anion gap: 6 (ref 5–15)
BUN: 26 mg/dL — ABNORMAL HIGH (ref 8–23)
CO2: 23 mmol/L (ref 22–32)
Calcium: 8.8 mg/dL — ABNORMAL LOW (ref 8.9–10.3)
Chloride: 110 mmol/L (ref 98–111)
Creatinine, Ser: 1.32 mg/dL — ABNORMAL HIGH (ref 0.61–1.24)
GFR, Estimated: >60 mL/min (ref >60)
Glucose, Bld: 109 mg/dL — ABNORMAL HIGH (ref 70–99)
Potassium: 5 mmol/L (ref 3.5–5.1)
Sodium: 139 mmol/L (ref 135–145)

## 2022-07-25 LAB — CBC
HCT: 21.6 % — ABNORMAL LOW (ref 39.0–52.0)
Hemoglobin: 7.2 g/dL — ABNORMAL LOW (ref 13.0–17.0)
MCH: 33.6 pg (ref 26.0–34.0)
MCHC: 33.3 g/dL (ref 30.0–36.0)
MCV: 100.9 fL — ABNORMAL HIGH (ref 80.0–100.0)
Platelets: 74 10*3/uL — ABNORMAL LOW (ref 150–400)
RBC: 2.14 MIL/uL — ABNORMAL LOW (ref 4.22–5.81)
RDW: 15.2 % (ref 11.5–15.5)
WBC: 1.2 10*3/uL — CL (ref 4.0–10.5)
nRBC: 0 % (ref 0.0–0.2)

## 2022-07-25 LAB — TECHNOLOGIST SMEAR REVIEW: Plt Morphology: NORMAL

## 2022-07-25 LAB — HEMOGLOBIN AND HEMATOCRIT, BLOOD
HCT: 27.6 % — ABNORMAL LOW (ref 39.0–52.0)
Hemoglobin: 9.4 g/dL — ABNORMAL LOW (ref 13.0–17.0)

## 2022-07-25 LAB — PREPARE RBC (CROSSMATCH)

## 2022-07-25 LAB — PATHOLOGIST SMEAR REVIEW

## 2022-07-25 MED ORDER — SODIUM CHLORIDE 0.9 % IV SOLN: Freq: Once | INTRAVENOUS | Status: AC

## 2022-07-25 MED ORDER — APIXABAN 5 MG PO TABS
5.0000 mg | ORAL_TABLET | Freq: Two times a day (BID) | ORAL | Status: DC
  Administered 2022-07-25 – 2022-07-26 (×3): 5 mg via ORAL
  Filled 2022-07-25 (×3): qty 1

## 2022-07-25 MED ORDER — SODIUM CHLORIDE 0.9 % IV SOLN: INTRAVENOUS | Status: AC

## 2022-07-25 MED ORDER — ORAL CARE MOUTH RINSE: 15.0000 mL | OROMUCOSAL | Status: DC | PRN

## 2022-07-25 MED ORDER — CHLORHEXIDINE GLUCONATE CLOTH 2 % EX PADS
6.0000 | MEDICATED_PAD | Freq: Every day | CUTANEOUS | Status: DC
  Administered 2022-07-26: 6 via TOPICAL

## 2022-07-25 MED ORDER — ASPIRIN 81 MG PO TBEC
81.0000 mg | DELAYED_RELEASE_TABLET | Freq: Every day | ORAL | Status: DC
  Administered 2022-07-25 – 2022-07-26 (×2): 81 mg via ORAL
  Filled 2022-07-25 (×2): qty 1

## 2022-07-25 NOTE — ED Notes (Addendum)
Pt endorsing his dizziness is subsiding , provider notified.

## 2022-07-25 NOTE — Progress Notes (Signed)
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  Upon admission noted to have hemoglobin of 7, given 1 unit PRBC transfusion and started on PPI drip.  GI team was consulted by admitting provider.   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  Baseline hemoglobin 10.0, admission hemoglobin 7.0.  Required 1 unit PRBC transfusion.  Additional 2 units ordered.  Transition to PPI IV, discontinue drip.  Will discuss starting aspirin/Eliquis GI/oncology consulted  Drop in platelets, hemoglobin and WBC raises suspicion for bone marrow suppression  Cholangiocarcinoma Malignant melanoma - Follows outpatient Dr. Cathie Hoops.  Stage 3a chronic kidney disease (HCC) At baseline.  Creatinine 1.3  HLD (hyperlipidemia) Lipitor  HTN (hypertension) Lisinopril, Toprol-XL.  IV as needed  Hyperkalemia Resolved  CAD S/P CABG x 3; May 2023 Cont Lipitor, Metoprolol Ok to Resume ASA/Eliquis?  Will discuss      DVT prophylaxis: SCD Code Status: Full  Family Communication:  None Status is: Inpatient Closely monitor hemoglobin  levels.  Still feeling dizzy PT/OT      Diet Orders (From admission, onward)     Start     Ordered   07/24/22 1408  Diet clear liquid Room service appropriate? Yes; Fluid consistency: Thin  Diet effective now       Question Answer Comment  Room service appropriate? Yes   Fluid consistency: Thin      07/24/22 1407            Subjective:  Seen at bedside, still feels quite dizzy.  Denies any obvious signs of bleeding.  His last chemotherapy was about a week ago  Examination:  General exam: Overall appears pale, not in acute distress. Respiratory system: Clear to auscultation. Respiratory effort normal. Cardiovascular system: S1 & S2 heard, RRR. No JVD, murmurs, rubs, gallops or clicks. No pedal edema. Gastrointestinal system: Abdomen is nondistended, soft and nontender. No organomegaly or masses felt. Normal bowel sounds heard. Central nervous system: Alert and oriented. No focal neurological deficits. Extremities: Symmetric 5 x 5 power. Skin: No rashes, lesions or ulcers Psychiatry: Judgement and insight appear normal. Mood & affect appropriate.  Objective: Vitals:   07/25/22 0344 07/25/22 0530 07/25/22 0630 07/25/22 0700  BP: (!) 118/41 (!) 104/56 (!) 121/55 96/67  Pulse: 68 (!) 59 (!) 59 (!) 59  Resp: 20 (!) 25 (!) 21 19  Temp: 98.6 F (37 C)     TempSrc: Oral     SpO2: 95% 95% 94% 94%  Weight:      Height:        Intake/Output Summary (Last 24 hours) at 07/25/2022 0856 Last data filed at 07/24/2022 1621 Gross per 24 hour  Intake 100 ml  Output --  Net 100 ml   Filed Weights   07/24/22 1039  Weight: 113.4 kg    Scheduled Meds:  atorvastatin  20 mg Oral QHS   clotrimazole  1 Application Topical BID   lacosamide  100 mg Oral BID   lisinopril  10 mg Oral Daily   metoprolol succinate  25 mg Oral Daily   multivitamin with minerals  1 tablet Oral Daily   [START ON 07/28/2022] pantoprazole  40 mg Intravenous Q12H   Continuous Infusions:  pantoprazole 8  mg/hr (07/25/22 0108)    Nutritional status     Body mass index is 39.16 kg/m.  Data Reviewed:   CBC: Recent Labs  Lab 07/19/22 0903 07/24/22 1040 07/25/22 0445  WBC 2.4* 2.1* 1.2*  NEUTROABS 1.6*  --   --   HGB 8.2* 7.0* 7.2*  HCT 23.6* 21.2* 21.6*  MCV 101.3* 103.4* 100.9*  PLT 112* 102* 74*   Basic Metabolic Panel: Recent Labs  Lab 07/19/22 0903 07/24/22 1040 07/25/22 0445  NA 135 139 139  K 4.5 5.4* 5.0  CL 108 111 110  CO2 19* 23 23  GLUCOSE 145* 115* 109*  BUN 36* 36* 26*  CREATININE 1.56* 1.61* 1.32*  CALCIUM 8.8* 9.0 8.8*   GFR: Estimated Creatinine Clearance: 69.8 mL/min (A) (by C-G formula based on SCr of 1.32 mg/dL (H)). Liver Function Tests: Recent Labs  Lab 07/19/22 0903  AST 38  ALT 49*  ALKPHOS 82  BILITOT 0.5  PROT 6.9  ALBUMIN 3.9   No results for input(s): "LIPASE", "AMYLASE" in the last 168 hours. No results for input(s): "AMMONIA" in the last 168 hours. Coagulation Profile: No results for input(s): "INR", "PROTIME" in the last 168 hours. Cardiac Enzymes: No results for input(s): "CKTOTAL", "CKMB", "CKMBINDEX", "TROPONINI" in the last 168 hours. BNP (last 3 results) No results for input(s): "PROBNP" in the last 8760 hours. HbA1C: No results for input(s): "HGBA1C" in the last 72 hours. CBG: No results for input(s): "GLUCAP" in the last 168 hours. Lipid Profile: No results for input(s): "CHOL", "HDL", "LDLCALC", "TRIG", "CHOLHDL", "LDLDIRECT" in the last 72 hours. Thyroid Function Tests: No results for input(s): "TSH", "T4TOTAL", "FREET4", "T3FREE", "THYROIDAB" in the last 72 hours. Anemia Panel: No results for input(s): "VITAMINB12", "FOLATE", "FERRITIN", "TIBC", "IRON", "RETICCTPCT" in the last 72 hours. Sepsis Labs: No results for input(s): "PROCALCITON", "LATICACIDVEN" in the last 168 hours.  No results found for this or any previous visit (from the past 240 hour(s)).       Radiology Studies: DG Chest Port 1  View  Result Date: 07/24/2022 CLINICAL DATA:  Dyspnea on exertion EXAM: PORTABLE CHEST 1 VIEW COMPARISON:  Chest x-ray 02/21/2022 FINDINGS: Right chest port catheter tip projects over the SVC. Left-sided pacemaker again noted. Sternotomy wires and mediastinal clips are present. The cardiomediastinal silhouette is within normal limits. The lungs are clear. There is no pleural effusion or pneumothorax. No acute fractures are seen. IMPRESSION: No active disease. Electronically Signed   By: Darliss Cheney M.D.   On: 07/24/2022 17:47           LOS: 1 day   Time spent= 35 mins    Chadd Tollison Joline Maxcy, MD [facility]  If 7PM-7AM, please contact night-coverage  07/25/2022, 8:56 AM

## 2022-07-25 NOTE — Progress Notes (Signed)
OT Cancellation Note  Patient Details Name: Edward Pitts MRN: [id number] DOB: 1960/04/19   Cancelled Treatment:    Reason Eval/Treat Not Completed: Medical issues which prohibited therapy. Upon attempt, pt receiving blood transfusion. Will re-attempt OT evaluation at later date/time as appropriate.   Arman Filter., MPH, MS, OTR/L ascom [phone] 07/25/22, 1:35 PM

## 2022-07-25 NOTE — Consult Note (Signed)
Hematology/Oncology Consult note [phone] [phone]      Patient Care Team: Miki Kins, FNP as PCP - General (Family Medicine) Rickard Patience, MD as Consulting Physician (Oncology) Barbaraann Cao Georgeanna Lea, MD as Consulting Physician (Oncology) Lamar Blinks, MD as Consulting Physician (Cardiology) Miki Kins, FNP (Family Medicine)   Name of the patient: Edward Pitts  [id number]  [birth year]   REASON FOR COSULTATION:   History of presenting illness-  62 y.o. male with PMH listed at below who presents to ER after feeling dizziness upon picking up something on the floor and standing up.  He was found to have low blood pressure at home and presented emergency room for further evaluation. He has felt tight and fatigued.  Denies hematochezia, hematuria, hematemesis, epistaxis, black tarry stool or easy bruising.  Patient is known to oncology service for recurrent melanoma status post immunotherapy treatments currently NED, stage I cholangiocarcinoma status post resection, currently on adjuvant chemotherapy. He did not tolerate Xeloda due to skin rash and has recently been switched to gemcitabine 1000 mg 2 weeks on 1 week off.  Last treatment was 07/19/2022, hemoglobin at that time was 8.2. Patient has anemia secondary to chronic kidney disease.  Has been on oral iron supplementation.  He reports dark stool, attributes to oral iron supplementation.  Patient takes Eliquis 5 mg twice daily for cardiovascular problems.  Patient has history of open heart surgery due to CAD.  Chronic shortness of breath.  07/24/2022, in emergency room, patient was found to have hemoglobin of 7.0, MCV 103.4, platelet 102.  WBC 2.1 On 07/25/2022, WBC further decreased to 1.2, neutrophil 0.9.  Hemoglobin 7.4. Patient has received 1 unit of PRBC, Eliquis and aspirin was on hold due to concern of possible GI bleed.  GI was consulted.    Allergies  Allergen Reactions   Ibuprofen Other  (See Comments)    Affects kidneys   Levetiracetam     Other reaction(s): Dizziness, Headache   Nsaids     Affects kidneys   Capecitabine Rash    Patient Active Problem List   Diagnosis Date Noted   Cholangiocarcinoma (HCC) 02/21/2022    Priority: High   Chemotherapy induced neutropenia (HCC) 07/19/2022    Priority: Medium    Encounter for antineoplastic chemotherapy 03/19/2022    Priority: Medium    Malignant melanoma of overlapping sites (HCC) 04/23/2019    Priority: Medium    Anemia 12/11/2018    Priority: Medium    Stage 3a chronic kidney disease (HCC) 12/11/2018    Priority: Medium    Candidiasis of scrotum 06/07/2022    Priority: Low   Skin rash 05/29/2022    Priority: Low   Hyperkalemia 02/21/2022    Priority: Low   Anxiety 09/26/2021    Priority: Low   Encounter for antineoplastic immunotherapy 08/06/2019    Priority: Low   Symptomatic anemia 07/24/2022   Liver mass 01/09/2022   Postop check 11/14/2021   S/P CABG x 3 08/10/2021   Coronary artery disease 08/10/2021   S/P placement of cardiac pacemaker 08/08/2021   NSTEMI (non-ST elevated myocardial infarction) (HCC) 08/05/2021   Obesity, Class III, BMI 40-49.9 (morbid obesity) (HCC) 08/05/2021   Elevated serum creatinine 04/15/2021   Degenerative joint disease of knee 07/14/2020   Other specified visual disturbances 07/14/2020   Hx of total hip arthroplasty, left 07/14/2020   Exposure to combat 04/20/2020   Primary osteoarthritis of left hip 04/20/2020   Dizziness 12/22/2019   Arthritis of left  knee 11/27/2019   Vitiligo 09/03/2019   Port-A-Cath in place 08/20/2019   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) 04/16/2019   Benign hypertensive kidney disease with chronic kidney disease 12/11/2018   AKI (acute kidney injury) (HCC) 08/24/2018   HTN (hypertension) 08/24/2018   HLD (hyperlipidemia) 08/24/2018   Seizures (HCC) 08/24/2018   Complete heart block (HCC) 08/24/2018   Arthritis of wrist, left  06/12/2018   Bradycardia 06/04/2018   Chronic gouty arthropathy without tophi 03/13/2018   Positive ANA (antinuclear antibody) 03/05/2018   Swelling of joint of left wrist 03/05/2018   Coronary artery disease involving native coronary artery of native heart 07/09/2017   Stable angina 06/27/2017   LBBB (left bundle branch block) 05/25/2017     Past Medical History:  Diagnosis Date   Anemia    iron treatments   Anxiety    Aortic atherosclerosis (HCC)    Arthritis    Cancer of groin (HCC) 2021   left groin, resected, radiation   Cataract    Complication of anesthesia    PONV   Coronary artery disease    Dizziness of unknown etiology    has led to seizures and passing out.   Family history of adverse reaction to anesthesia    PONV mother   GERD (gastroesophageal reflux disease)    History of complete heart block    PPM placed   Hyperlipidemia    Hypertension    LBBB (left bundle branch block)    Lymphedema of left leg    uses thigh high compression stockings   Melanoma (HCC) 2012   skin cancer, left thigh   OSA on CPAP    PONV (postoperative nausea and vomiting) 04/16/2019   Port-A-Cath in place    RIGHT chest wall   Presence of cardiac pacemaker    [employer]   Seizures (HCC)    still has episodes of dizziness. last event 1 month ago (march 2022) and will pass out. takes clonazepam     Past Surgical History:  Procedure Laterality Date   CORONARY ARTERY BYPASS GRAFT N/A 08/10/2021   Procedure: CORONARY ARTERY BYPASS GRAFTING (CABG) X 3 USING LEFT INTERNAL MAMMARY ARTERY AND RIGHT GREATER SAPHENOUS VEIN;  Surgeon: Lovett Sox, MD;  Location: [facility] OR;  Service: Open Heart Surgery;  Laterality: N/A;   CT RADIATION THERAPY GUIDE     left groin   dental implant     permanent implant   ENDOVEIN HARVEST OF GREATER SAPHENOUS VEIN Right 08/10/2021   Procedure: ENDOVEIN HARVEST OF GREATER SAPHENOUS VEIN;  Surgeon: Lovett Sox, MD;  Location: [facility] OR;  Service: Open Heart  Surgery;  Laterality: Right;   KNEE SURGERY Left    arthroscopy   LEFT HEART CATH AND CORONARY ANGIOGRAPHY Left 06/29/2017   Procedure: LEFT HEART CATH AND CORONARY ANGIOGRAPHY;  Surgeon: Lamar Blinks, MD;  Location: [facility] INVASIVE CV LAB;  Service: Cardiovascular;  Laterality: Left;   LEFT HEART CATH AND CORONARY ANGIOGRAPHY N/A 08/08/2021   Procedure: LEFT HEART CATH AND CORONARY ANGIOGRAPHY;  Surgeon: Lamar Blinks, MD;  Location: [facility] INVASIVE CV LAB;  Service: Cardiovascular;  Laterality: N/A;   LYMPH NODE DISSECTION Left 04/16/2019   Procedure: Left inguinal Lymph Node Dissection;  Surgeon: Almond Lint, MD;  Location: [facility] OR;  Service: General;  Laterality: Left;   MELANOMA EXCISION Left 04/16/2019   Procedure: MELANOMA EXCISION LEFT GROIN MASS;  Surgeon: Almond Lint, MD;  Location: [facility] OR;  Service: General;  Laterality: Left;   MELANOMA EXCISION  WITH SENTINEL LYMPH NODE BIOPSY Left 2012   Left calf    PACEMAKER INSERTION N/A 08/26/2018   Procedure: INSERTION PACEMAKER;  Surgeon: Marcina Millard, MD;  Location: [facility] ORS;  Service: Cardiovascular;  Laterality: N/A;   PORTA CATH INSERTION N/A 08/26/2019   Procedure: PORTA CATH INSERTION;  Surgeon: Renford Dills, MD;  Location: [facility] INVASIVE CV LAB;  Service: Cardiovascular;  Laterality: N/A;   SUPERFICIAL LYMPH NODE BIOPSY / EXCISION Left 2020   lymph nodes removed around left groin melanoma site   TEE WITHOUT CARDIOVERSION N/A 08/10/2021   Procedure: TRANSESOPHAGEAL ECHOCARDIOGRAM (TEE);  Surgeon: Lovett Sox, MD;  Location[facility] OR;  Service: Open Heart Surgery;  Laterality: N/A;   TEMPORARY PACEMAKER N/A 08/25/2018   Procedure: TEMPORARY PACEMAKER;  Surgeon: Tonny Bollman, MD;  Location: [facility] INVASIVE CV LAB;  Service: Cardiovascular;  Laterality: N/A;   TOTAL HIP ARTHROPLASTY Left 07/14/2020   Procedure: TOTAL HIP ARTHROPLASTY;  Surgeon: Donato Heinz, MD;  Location: [facility] ORS;  Service: Orthopedics;  Laterality: Left;     Social History   Socioeconomic History   Marital status: Married    Spouse name: [name]    Number of children: 7   Years of education: 12   Highest education level: Not on file  Occupational History    Comment: disability  Tobacco Use   Smoking status: Never   Smokeless tobacco: Never  Vaping Use   Vaping Use: Never used  Substance and Sexual Activity   Alcohol use: No   Drug use: No   Sexual activity: Not Currently  Other Topics Concern   Not on file  Social History Narrative   Lives with  Wife,   Has 2 small dogs   Caffeine use: sodas (2 per day)      Out of work on disability.  Has a walk in shower. No stairs to climb   Oncology treatment ongoing. Uses port a cath for treatment.      pacemaker   Social Determinants of Health   Financial Resource Strain: Low Risk  (02/12/2019)   Overall Financial Resource Strain (CARDIA)    Difficulty of Paying Living Expenses: Not hard at all  Food Insecurity: Food Insecurity Present (02/21/2022)   Hunger Vital Sign    Worried About Running Out of Food in the Last Year: Sometimes true    Ran Out of Food in the Last Year: Often true  Transportation Needs: No Transportation Needs (07/25/2022)   PRAPARE - Administrator, Civil Service (Medical): No    Lack of Transportation (Non-Medical): No  Physical Activity: Unknown (02/12/2019)   Exercise Vital Sign    Days of Exercise per Week: 0 days    Minutes of Exercise per Session: Not on file  Stress: No Stress Concern Present (02/12/2019)   [employer] of Occupational Health - Occupational Stress Questionnaire    Feeling of Stress : Only a little  Social Connections: Unknown (02/12/2019)   Social Connection and Isolation Panel [NHANES]    Frequency of Communication with Friends and Family: More than three times a week    Frequency of Social Gatherings with Friends and Family: Not on file    Attends Religious Services: Not on file    Active Member of Clubs or  Organizations: Not on file    Attends Banker Meetings: Not on file    Marital Status: Married  Intimate Partner Violence: Not At Risk (07/25/2022)   Humiliation, Afraid, Rape, and Kick questionnaire  Fear of Current or Ex-Partner: No    Emotionally Abused: No    Physically Abused: No    Sexually Abused: No     Family History  Problem Relation Age of Onset   Cancer Paternal Grandmother      Current Facility-Administered Medications:    0.9 %  sodium chloride infusion, , Intravenous, Continuous, Amin, Ankit Chirag, MD, Last Rate: 75 mL/hr at 07/25/22 1018, New Bag at 07/25/22 1018   acetaminophen (TYLENOL) tablet 650 mg, 650 mg, Oral, Q6H PRN **OR** acetaminophen (TYLENOL) suppository 650 mg, 650 mg, Rectal, Q6H PRN, Cox, Amy N, DO   atorvastatin (LIPITOR) tablet 20 mg, 20 mg, Oral, QHS, Cox, Amy N, DO, 20 mg at 07/24/22 2233   clonazePAM (KLONOPIN) tablet 0.5 mg, 0.5 mg, Oral, BID PRN, Cox, Amy N, DO, 0.5 mg at 07/25/22 1035   clotrimazole (LOTRIMIN) 1 % cream 1 Application, 1 Application, Topical, BID, Cox, Amy N, DO, 1 Application at 07/24/22 2233   lacosamide (VIMPAT) tablet 100 mg, 100 mg, Oral, BID, Cox, Amy N, DO, 100 mg at 07/25/22 1029   lidocaine-prilocaine (EMLA) cream 1 Application, 1 Application, Topical, PRN, Cox, Amy N, DO   lisinopril (ZESTRIL) tablet 10 mg, 10 mg, Oral, Daily, Cox, Amy N, DO   metoprolol succinate (TOPROL-XL) 24 hr tablet 25 mg, 25 mg, Oral, Daily, Cox, Amy N, DO   multivitamin with minerals tablet 1 tablet, 1 tablet, Oral, Daily, Cox, Amy N, DO, 1 tablet at 07/25/22 1029   nystatin (MYCOSTATIN/NYSTOP) topical powder 1 Application, 1 Application, Topical, TID PRN, Cox, Amy N, DO   ondansetron (ZOFRAN) tablet 4 mg, 4 mg, Oral, Q6H PRN **OR** ondansetron (ZOFRAN) injection 4 mg, 4 mg, Intravenous, Q6H PRN, Cox, Amy N, DO   [START ON 07/28/2022] pantoprazole (PROTONIX) injection 40 mg, 40 mg, Intravenous, Q12H, Cox, Amy N, DO   senna-docusate  (Senokot-S) tablet 1 tablet, 1 tablet, Oral, QHS PRN, Cox, Amy N, DO  Current Outpatient Medications:    apixaban (ELIQUIS) 5 MG TABS tablet, Take 5 mg by mouth 2 (two) times daily., Disp: , Rfl:    aspirin 81 MG EC tablet, Take 81 mg by mouth daily., Disp: , Rfl:    atorvastatin (LIPITOR) 20 MG tablet, Take 20 mg by mouth daily., Disp: , Rfl:    Carboxymeth-Glyc-Polysorb PF (REFRESH OPTIVE MEGA-3) 0.5-1-0.5 % SOLN, Place 1 drop into both eyes daily as needed (dry eyes)., Disp: , Rfl:    clonazePAM (KLONOPIN) 0.5 MG tablet, TAKE 1 TABLET BY MOUTH IN THE MORNING AND 1 BY MOUTH IN THE EVENING per pt, Disp: 120 tablet, Rfl: 1   clotrimazole (CLOTRIMAZOLE ANTI-FUNGAL) 1 % cream, Apply 1 Application topically 2 (two) times daily. Apply to the L inguinal fold, Disp: 30 g, Rfl: 0   ferrous sulfate 325 (65 FE) MG EC tablet, Take 1 tablet (325 mg total) by mouth daily., Disp: 180 tablet, Rfl: 1   hydrocortisone 2.5 % ointment, Apply 1 application. topically daily as needed (itching)., Disp: , Rfl:    Lacosamide 100 MG TABS, Take 1 tablet (100 mg total) by mouth in the morning and at bedtime., Disp: 60 tablet, Rfl: 3   lidocaine-prilocaine (EMLA) cream, Apply 1 application. topically as needed. Apply small amount of cream to port site approx 1-2 hours prior to appointment., Disp: 30 g, Rfl: 11   lisinopril (ZESTRIL) 10 MG tablet, Take 1 tablet by mouth daily., Disp: , Rfl:    metoprolol succinate (TOPROL-XL) 25 MG 24 hr tablet,  Take 25 mg by mouth daily., Disp: , Rfl:    Multiple Vitamin (MULTIVITAMIN WITH MINERALS) TABS tablet, Take 1 tablet by mouth daily. Centrum Silver, Disp: , Rfl:    nystatin (MYCOSTATIN/NYSTOP) powder, Apply 1 Application topically 3 (three) times daily., Disp: 60 g, Rfl: 1   omeprazole (PRILOSEC) 20 MG capsule, TAKE 1 CAPSULE BY MOUTH EVERY DAY, Disp: 90 capsule, Rfl: 0   ondansetron (ZOFRAN) 4 MG tablet, TAKE 1 TABLET BY MOUTH EVERY 8 HOURS AS NEEDED FOR NAUSEA AND VOMITING, Disp:  90 tablet, Rfl: 1   capecitabine (XELODA) 500 MG tablet, Take 4 tablets (2,000 mg total) by mouth 2 (two) times daily after a meal. Take for 14 days, then hold for 7 days. Repeat every 21 days. (Patient not taking: Reported on 07/10/2022), Disp: 112 tablet, Rfl: 0   febuxostat (ULORIC) 40 MG tablet, Take 40 mg by mouth daily. (Patient not taking: Reported on 07/24/2022), Disp: , Rfl:    traZODone (DESYREL) 50 MG tablet, TAKE 1 TABLET BY MOUTH EVERY DAY AT BEDTIME AS NEEDED FOR SLEEP (Patient not taking: Reported on 07/19/2022), Disp: 90 tablet, Rfl: 1   zinc oxide 20 % ointment, Apply 1 Application topically as needed for irritation. (Patient not taking: Reported on 07/24/2022), Disp: 56.7 g, Rfl: 0  Facility-Administered Medications Ordered in Other Encounters:    heparin lock flush 100 UNIT/ML injection, , , ,    heparin lock flush 100 UNIT/ML injection, , , ,   Review of Systems  Constitutional:  Positive for fatigue. Negative for appetite change, chills, fever and unexpected weight change.  HENT:   Negative for hearing loss and voice change.   Eyes:  Negative for eye problems and icterus.  Respiratory:  Positive for shortness of breath. Negative for chest tightness and cough.   Cardiovascular:  Negative for chest pain and leg swelling.  Gastrointestinal:  Negative for abdominal distention and abdominal pain.  Endocrine: Negative for hot flashes.  Genitourinary:  Negative for difficulty urinating, dysuria and frequency.   Musculoskeletal:  Negative for arthralgias.  Skin:  Negative for itching and rash.  Neurological:  Positive for light-headedness. Negative for numbness.  Hematological:  Negative for adenopathy. Does not bruise/bleed easily.  Psychiatric/Behavioral:  Negative for confusion.     PHYSICAL EXAM Vitals:   07/25/22 1100 07/25/22 1148 07/25/22 1205 07/25/22 1220  BP: (!) 127/56 (!) 118/47 (!) 133/59 (!) 137/49  Pulse: (!) 59 61 60 61  Resp: (!) 23 16 16 20   Temp:  98.1 F  (36.7 C) 98.1 F (36.7 C) 98.4 F (36.9 C)  TempSrc:  Oral  Oral  SpO2: 97% 99%  99%  Weight:      Height:       Physical Exam Constitutional:      General: He is not in acute distress.    Appearance: He is not diaphoretic.  HENT:     Head: Normocephalic and atraumatic.     Nose: Nose normal.     Mouth/Throat:     Pharynx: No oropharyngeal exudate.  Eyes:     General: No scleral icterus.    Pupils: Pupils are equal, round, and reactive to light.  Cardiovascular:     Rate and Rhythm: Normal rate and regular rhythm.     Heart sounds: No murmur heard. Pulmonary:     Effort: Pulmonary effort is normal. No respiratory distress.     Breath sounds: No rales.  Chest:     Chest wall: No tenderness.  Abdominal:  General: There is no distension.     Palpations: Abdomen is soft.     Tenderness: There is no abdominal tenderness.  Musculoskeletal:        General: Normal range of motion.     Cervical back: Normal range of motion and neck supple.  Skin:    General: Skin is warm and dry.     Findings: No erythema.  Neurological:     Mental Status: He is alert and oriented to person, place, and time.     Cranial Nerves: No cranial nerve deficit.     Motor: No abnormal muscle tone.     Coordination: Coordination normal.  Psychiatric:        Mood and Affect: Affect normal.       LABORATORY STUDIES    Latest Ref Rng & Units 07/25/2022    4:45 AM 07/24/2022   10:40 AM 07/19/2022    9:03 AM  CBC  WBC 4.0 - 10.5 K/uL 4.0 - 10.5 K/uL 1.2    1.2  2.1  2.4   Hemoglobin 13.0 - 17.0 g/dL 91.4 - 78.2 g/dL 7.2    7.4  7.0  8.2   Hematocrit 39.0 - 52.0 % 39.0 - 52.0 % 21.6    22.3  21.2  23.6   Platelets 150 - 400 K/uL 150 - 400 K/uL 74    76  102  112       Latest Ref Rng & Units 07/25/2022    4:45 AM 07/24/2022   10:40 AM 07/19/2022    9:03 AM  CMP  Glucose 70 - 99 mg/dL 956  213  086   BUN 8 - 23 mg/dL 26  36  36   Creatinine 0.61 - 1.24 mg/dL 5.78  4.69  6.29   Sodium  135 - 145 mmol/L 139  139  135   Potassium 3.5 - 5.1 mmol/L 5.0  5.4  4.5   Chloride 98 - 111 mmol/L 110  111  108   CO2 22 - 32 mmol/L 23  23  19    Calcium 8.9 - 10.3 mg/dL 8.8  9.0  8.8   Total Protein 6.5 - 8.1 g/dL   6.9   Total Bilirubin 0.3 - 1.2 mg/dL   0.5   Alkaline Phos 38 - 126 U/L   82   AST 15 - 41 U/L   38   ALT 0 - 44 U/L   49      RADIOGRAPHIC STUDIES: I have personally reviewed the radiological images as listed and agreed with the findings in the report. DG Chest Port 1 View  Result Date: 07/24/2022 CLINICAL DATA:  Dyspnea on exertion EXAM: PORTABLE CHEST 1 VIEW COMPARISON:  Chest x-ray 02/21/2022 FINDINGS: Right chest port catheter tip projects over the SVC. Left-sided pacemaker again noted. Sternotomy wires and mediastinal clips are present. The cardiomediastinal silhouette is within normal limits. The lungs are clear. There is no pleural effusion or pneumothorax. No acute fractures are seen. IMPRESSION: No active disease. Electronically Signed   By: Darliss Cheney M.D.   On: 07/24/2022 17:47   PCV ECHOCARDIOGRAM COMPLETE  Result Date: 05/16/2022 Images from the original result were not included. Reason for Visit  INDICATIONS:   The patient is Z00.00. Echocardiogram: An echocardiogram in (2-d) mode was performed and in Doppler mode with color flow velocity mapping was performed. ventricular septum thickness 1.23 cm, L ventricular posterior wall thickness (diastole) 1.22 cm, left atrium size 4.2 cm, aortic root diameter 3.6 cm,  L ventricle diastolic dimension 4.38 cm, L ventricle systolic dimension 3.12, L ventricle ejection fraction 55.6 %, and LV fractional shortening 28.8 % L ventricular outflow tract internal diameter 3.2 cm, L ventricular outflow tract flow velocity 1.38 m/s, aortic valve cusps 1.7 cm , aortic valve flow velocity 1.71 (m/sec), aortic valve systolic calculated mean flow gradient 8 mmHg, mitral valve diastolic peak flow velocity E 1.61 m/sec, and mitral valve  diastolic peak flow E/A ratio 3.2 % Mitral valve has trace regurgitation Tricuspid valve has trace regurgitation ASSESSMENT Technically adequate study. Mild left ventricular hypertrophy with GRADE 2 (psuedonormalization)  diastolic dysfunction. Normal right ventricular systolic function. Normal right ventricular diastolic function. Normal left ventricular wall motion. Normal right ventricular wall motion. Trace tricuspid regurgitation. Mild pulmonary hypertension. Trace mitral regurgitation. No pericardial effusion. Mildly dilated Left atrium Mild LVH     Assessment and plan-   # Symptomatic anemia, Likely secondary to recent gemcitabine chemotherapy. I agree with PRBC transfusion. His iron levels were recently checked.  No clinical history of bleeding. Discussed with Dr. Allegra Lai, will hold off EGD/colonoscopy.  Close monitor hemoglobin. Transfuse PRBC to keep hemoglobin >=8.   # Chemotherapy induced neutropenia Currently he is hemodynamically stable.  No signs of infection. I will repeat a CBC in AM.  If ANC is worse, consider G-CSF support.  # Anemia due to chronic kidney disease Patient has been on oral iron supplementation to maintain iron store. Due to the fact that iron supplementation may cause stool color changes, mask underlying GI bleeding, recommend patient to stop oral iron supplementation. Will arrange patient to get additional IV Venofer treatments if needed.  # CAD, on Eliquis and aspirin. His clinical picture is not consistent with anemia secondary to GI bleeding. Okay with resuming anticoagulation. Monitor hemoglobin closely.  # Recurrent melanoma, treated with immunotherapy.  Currently NED. # Stage I cholangiocarcinoma, currently on adjuvant chemotherapy.  Consider dose reduced gemcitabine for future cycles.   Thank you for allowing me to participate in the care of this patient.   Rickard Patience, MD, PhD Hematology Oncology 07/25/2022

## 2022-07-25 NOTE — Telephone Encounter (Signed)
Pt called and stated he is currently in the Emergency room at [facility]. Stated he will let us know if they keep him.  He has a lab appointment tomorrow.

## 2022-07-25 NOTE — ED Notes (Signed)
Ankit Byrd Hesselbach MD notified of hgb results after PRBC admin, awaiting response at this time. Dr from night shift was made aware of this by previous RN with no new orders. Awaiting response for clarification that no new orders desired.

## 2022-07-25 NOTE — Consult Note (Signed)
Edward Repress, MD [address]  [address]  [place], [place] [address]  Main: [phone]  Fax: [phone] Pager: [phone]   Consultation  Referring Provider:     No ref. provider found Primary Care Physician:  Miki Kins, FNP Primary Gastroenterologist: Gentry Fitz       Reason for Consultation: Anemia Date of Admission:  07/24/2022 Date of Consultation:  07/25/2022         HPI:   Edward Pitts is a 61 y.o. male with history of cholangiocarcinoma, received Xeloda, currently on gemcitabine, received last dose on 07/19/2022, malignant melanoma of overlapping sites, with no active disease, undergoing surveillance, stage III CKD, anemia of chronic kidney disease as well as chemotherapy-induced anemia and neutropenia presented to ER yesterday secondary to dizziness, found to have low blood pressure as well as declining hemoglobin.  His hemoglobin was 7.2 yesterday and today, dropped from 8.2 since 07/19/2022.  Also, with worsening leukopenia WBC count 1.2, as well as thrombocytopenia 74  BUN/creatinine at baseline, no evidence of iron deficiency, patient denies any abdominal pain, melena, coffee-ground emesis or hematemesis or rectal bleeding.  Patient is started on pantoprazole drip and GI is consulted for further evaluation.  Patient is receiving blood transfusion. History of coronary artery disease s/p CABG Dr. Cathie Hoops is also bedside  NSAIDs: None  Antiplts/Anticoagulants/Anti thrombotics: On aspirin and Eliquis  GI Procedures: Underwent upper endoscopy and colonoscopy more than 10 years ago He denies any family history of GI malignancy  Past Medical History:  Diagnosis Date   Anemia    iron treatments   Anxiety    Aortic atherosclerosis (HCC)    Arthritis    Cancer of groin (HCC) 2021   left groin, resected, radiation   Cataract    Complication of anesthesia    PONV   Coronary artery disease    Dizziness of unknown etiology    has led to seizures and  passing out.   Family history of adverse reaction to anesthesia    PONV mother   GERD (gastroesophageal reflux disease)    History of complete heart block    PPM placed   Hyperlipidemia    Hypertension    LBBB (left bundle branch block)    Lymphedema of left leg    uses thigh high compression stockings   Melanoma (HCC) 2012   skin cancer, left thigh   OSA on CPAP    PONV (postoperative nausea and vomiting) 04/16/2019   Port-A-Cath in place    RIGHT chest wall   Presence of cardiac pacemaker    [employer]   Seizures (HCC)    still has episodes of dizziness. last event 1 month ago (march 2022) and will pass out. takes clonazepam    Past Surgical History:  Procedure Laterality Date   CORONARY ARTERY BYPASS GRAFT N/A 08/10/2021   Procedure: CORONARY ARTERY BYPASS GRAFTING (CABG) X 3 USING LEFT INTERNAL MAMMARY ARTERY AND RIGHT GREATER SAPHENOUS VEIN;  Surgeon: Lovett Sox, MD;  Location: [facility] OR;  Service: Open Heart Surgery;  Laterality: N/A;   CT RADIATION THERAPY GUIDE     left groin   dental implant     permanent implant   ENDOVEIN HARVEST OF GREATER SAPHENOUS VEIN Right 08/10/2021   Procedure: ENDOVEIN HARVEST OF GREATER SAPHENOUS VEIN;  Surgeon: Lovett Sox, MD;  Location: [facility] OR;  Service: Open Heart Surgery;  Laterality: Right;   KNEE SURGERY Left    arthroscopy   LEFT HEART CATH AND CORONARY ANGIOGRAPHY  Left 06/29/2017   Procedure: LEFT HEART CATH AND CORONARY ANGIOGRAPHY;  Surgeon: Lamar Blinks, MD;  Location: [facility] IN[facility];  Service: Cardiovascular;  Laterality: Left;   LEFT HEART CATH AND CORONARY ANGIOGRAPHY N/A 08/08/2021   Procedure: LEFT HEART CATH AND CORONARY ANGIOGRAPHY;  Surgeon: Lamar Blinks, MD;  Location: [facility];  Service: Cardiovascular;  Laterality: N/A;   LYMPH NODE DISSECTION Left 04/16/2019   Procedure: Left inguinal Lymph Node Dissection;  Surgeon: Almond Lint, MD;  Location: [facility] OR;  Service: General;  Laterality: Left;    MELANOMA EXCISION Left 04/16/2019   Procedure: MELANOMA EXCISION LEFT GROIN MASS;  Surgeon: Almond Lint, MD;  Location: [facility] OR;  Service: General;  Laterality: Left;   MELANOMA EXCISION WITH SENTINEL LYMPH NODE BIOPSY Left 2012   Left calf    PACEMAKER INSERTION N/A 08/26/2018   Procedure: INSERTION PACEMAKER;  Surgeon: Marcina Millard, MD;  Location: [facility] ORS;  Service: Cardiovascular;  Laterality: N/A;   PORTA CATH INSERTION N/A 08/26/2019   Procedure: PORTA CATH INSERTION;  Surgeon: Renford Dills, MD;  Location: [facility];  Service: Cardiovascular;  Laterality: N/A;   SUPERFICIAL LYMPH NODE BIOPSY / EXCISION Left 2020   lymph nodes removed around left groin melanoma site   TEE WITHOUT CARDIOVERSION N/A 08/10/2021   Procedure: TRANSESOPHAGEAL ECHOCARDIOGRAM (TEE);  Surgeon: Lovett Sox, MD;  Location: [facility] OR;  Service: Open Heart Surgery;  Laterality: N/A;   TEMPORARY PACEMAKER N/A 08/25/2018   Procedure: TEMPORARY PACEMAKER;  Surgeon: Tonny Bollman, MD;  Location: [facility] INVAS[facility] CV LAB;  Service: Cardiovascular;  Laterality: N/A;   TOTAL HIP ARTHROPLASTY Left 07/14/2020   Procedure: TOTAL HIP ARTHROPLASTY;  Surgeon: Donato Heinz, MD;  Location: [facility] ORS;  Service: Orthopedics;  Laterality: Left;     Current Facility-Administered Medications:    0.9 %  sodium chloride infusion, , Intravenous, Continuous, Amin, Ankit Chirag, MD, Last Rate: 75 mL/hr at 07/25/22 1018, New Bag at 07/25/22 1018   acetaminophen (TYLENOL) tablet 650 mg, 650 mg, Oral, Q6H PRN **OR** acetaminophen (TYLENOL) suppository 650 mg, 650 mg, Rectal, Q6H PRN, Cox, Amy N, DO   apixaban (ELIQUIS) tablet 5 mg, 5 mg, Oral, BID, Amin, Ankit Chirag, MD, 5 mg at 07/25/22 1350   aspirin EC tablet 81 mg, 81 mg, Oral, Daily, Amin, Ankit Chirag, MD, 81 mg at 07/25/22 1350   atorvastatin (LIPITOR) tablet 20 mg, 20 mg, Oral, QHS, Cox, Amy N, DO, 20 mg at 07/24/22 2233   clonazePAM (KLONOPIN) tablet 0.5 mg, 0.5  mg, Oral, BID PRN, Cox, Amy N, DO, 0.5 mg at 07/25/22 1035   clotrimazole (LOTRIMIN) 1 % cream 1 Application, 1 Application, Topical, BID, Cox, Amy N, DO, 1 Application at 07/24/22 2233   lacosamide (VIMPAT) tablet 100 mg, 100 mg, Oral, BID, Cox, Amy N, DO, 100 mg at 07/25/22 1029   lidocaine-prilocaine (EMLA) cream 1 Application, 1 Application, Topical, PRN, Cox, Amy N, DO   lisinopril (ZESTRIL) tablet 10 mg, 10 mg, Oral, Daily, Cox, Amy N, DO   metoprolol succinate (TOPROL-XL) 24 hr tablet 25 mg, 25 mg, Oral, Daily, Cox, Amy N, DO   multivitamin with minerals tablet 1 tablet, 1 tablet, Oral, Daily, Cox, Amy N, DO, 1 tablet at 07/25/22 1029   nystatin (MYCOSTATIN/NYSTOP) topical powder 1 Application, 1 Application, Topical, TID PRN, Cox, Amy N, DO   ondansetron (ZOFRAN) tablet 4 mg, 4 mg, Oral, Q6H PRN **OR** ondansetron (ZOFRAN) injection 4 mg, 4 mg, Intravenous, Q6H  PRN, Cox, Amy N, DO   [START ON 07/28/2022] pantoprazole (PROTONIX) injection 40 mg, 40 mg, Intravenous, Q12H, Cox, Amy N, DO   senna-docusate (Senokot-S) tablet 1 tablet, 1 tablet, Oral, QHS PRN, Cox, Amy N, DO  Current Outpatient Medications:    apixaban (ELIQUIS) 5 MG TABS tablet, Take 5 mg by mouth 2 (two) times daily., Disp: , Rfl:    aspirin 81 MG EC tablet, Take 81 mg by mouth daily., Disp: , Rfl:    atorvastatin (LIPITOR) 20 MG tablet, Take 20 mg by mouth daily., Disp: , Rfl:    Carboxymeth-Glyc-Polysorb PF (REFRESH OPTIVE MEGA-3) 0.5-1-0.5 % SOLN, Place 1 drop into both eyes daily as needed (dry eyes)., Disp: , Rfl:    clonazePAM (KLONOPIN) 0.5 MG tablet, TAKE 1 TABLET BY MOUTH IN THE MORNING AND 1 BY MOUTH IN THE EVENING per pt, Disp: 120 tablet, Rfl: 1   clotrimazole (CLOTRIMAZOLE ANTI-FUNGAL) 1 % cream, Apply 1 Application topically 2 (two) times daily. Apply to the L inguinal fold, Disp: 30 g, Rfl: 0   ferrous sulfate 325 (65 FE) MG EC tablet, Take 1 tablet (325 mg total) by mouth daily., Disp: 180 tablet, Rfl: 1    hydrocortisone 2.5 % ointment, Apply 1 application. topically daily as needed (itching)., Disp: , Rfl:    Lacosamide 100 MG TABS, Take 1 tablet (100 mg total) by mouth in the morning and at bedtime., Disp: 60 tablet, Rfl: 3   lidocaine-prilocaine (EMLA) cream, Apply 1 application. topically as needed. Apply small amount of cream to port site approx 1-2 hours prior to appointment., Disp: 30 g, Rfl: 11   lisinopril (ZESTRIL) 10 MG tablet, Take 1 tablet by mouth daily., Disp: , Rfl:    metoprolol succinate (TOPROL-XL) 25 MG 24 hr tablet, Take 25 mg by mouth daily., Disp: , Rfl:    Multiple Vitamin (MULTIVITAMIN WITH MINERALS) TABS tablet, Take 1 tablet by mouth daily. Centrum Silver, Disp: , Rfl:    nystatin (MYCOSTATIN/NYSTOP) powder, Apply 1 Application topically 3 (three) times daily., Disp: 60 g, Rfl: 1   omeprazole (PRILOSEC) 20 MG capsule, TAKE 1 CAPSULE BY MOUTH EVERY DAY, Disp: 90 capsule, Rfl: 0   ondansetron (ZOFRAN) 4 MG tablet, TAKE 1 TABLET BY MOUTH EVERY 8 HOURS AS NEEDED FOR NAUSEA AND VOMITING, Disp: 90 tablet, Rfl: 1   capecitabine (XELODA) 500 MG tablet, Take 4 tablets (2,000 mg total) by mouth 2 (two) times daily after a meal. Take for 14 days, then hold for 7 days. Repeat every 21 days. (Patient not taking: Reported on 07/10/2022), Disp: 112 tablet, Rfl: 0   febuxostat (ULORIC) 40 MG tablet, Take 40 mg by mouth daily. (Patient not taking: Reported on 07/24/2022), Disp: , Rfl:    traZODone (DESYREL) 50 MG tablet, TAKE 1 TABLET BY MOUTH EVERY DAY AT BEDTIME AS NEEDED FOR SLEEP (Patient not taking: Reported on 07/19/2022), Disp: 90 tablet, Rfl: 1   zinc oxide 20 % ointment, Apply 1 Application topically as needed for irritation. (Patient not taking: Reported on 07/24/2022), Disp: 56.7 g, Rfl: 0  Facility-Administered Medications Ordered in Other Encounters:    heparin lock flush 100 UNIT/ML injection, , , ,    heparin lock flush 100 UNIT/ML injection, , , ,    Family History  Problem  Relation Age of Onset   Cancer Paternal Grandmother      Social History   Tobacco Use   Smoking status: Never   Smokeless tobacco: Never  Vaping Use   Vaping  Use: Never used  Substance Use Topics   Alcohol use: No   Drug use: No    Allergies as of 07/24/2022 - Review Complete 07/24/2022  Allergen Reaction Noted   Ibuprofen Other (See Comments) 08/10/2018   Levetiracetam  06/28/2021   Nsaids  06/16/2019   Capecitabine Rash 07/18/2022    Review of Systems:    All systems reviewed and negative except where noted in HPI.   Physical Exam:  Vital signs in last 24 hours: Temp:  [97.8 F (36.6 C)-98.6 F (37 C)] 98.3 F (36.8 C) (04/30 1031) Pulse Rate:  [59-68] 61 (04/30 1031) Resp:  [16-28] 19 (04/30 1031) BP: (96-128)/(40-67) 128/49 (04/30 1031) SpO2:  [94 %-100 %] 99 % (04/30 1031)   General:   Pleasant, cooperative in NAD Head:  Normocephalic and atraumatic. Eyes:   No icterus.   Conjunctiva pale, PERRLA. Ears:  Normal auditory acuity. Neck:  Supple; no masses or thyroidomegaly Lungs: Respirations even and unlabored. Lungs clear to auscultation bilaterally.   No wheezes, crackles, or rhonchi.  Heart:  Regular rate and rhythm;  Without murmur, clicks, rubs or gallops Abdomen:  Soft, nondistended, nontender. Normal bowel sounds. No appreciable masses or hepatomegaly.  No rebound or guarding.  Rectal:  Not performed. Msk:  Symmetrical without gross deformities.  Strength generalized weakness Extremities:  Without edema, cyanosis or clubbing. Neurologic:  Alert and oriented x3;  grossly normal neurologically. Skin:  Intact without significant lesions or rashes. Psych:  Alert and cooperative. Normal affect.  LAB RESULTS:    Latest Ref Rng & Units 07/25/2022    4:45 AM 07/24/2022   10:40 AM 07/19/2022    9:03 AM  CBC  WBC 4.0 - 10.5 K/uL 1.2  2.1  2.4   Hemoglobin 13.0 - 17.0 g/dL 7.2  7.0  8.2   Hematocrit 39.0 - 52.0 % 21.6  21.2  23.6   Platelets 150 - 400 K/uL  74  102  112     BMET    Latest Ref Rng & Units 07/25/2022    4:45 AM 07/24/2022   10:40 AM 07/19/2022    9:03 AM  BMP  Glucose 70 - 99 mg/dL 161  096  045   BUN 8 - 23 mg/dL 26  36  36   Creatinine 0.61 - 1.24 mg/dL 4.09  8.11  9.14   Sodium 135 - 145 mmol/L 139  139  135   Potassium 3.5 - 5.1 mmol/L 5.0  5.4  4.5   Chloride 98 - 111 mmol/L 110  111  108   CO2 22 - 32 mmol/L 23  23  19    Calcium 8.9 - 10.3 mg/dL 8.8  9.0  8.8     LFT    Latest Ref Rng & Units 07/19/2022    9:03 AM 07/10/2022    9:42 AM 06/14/2022    9:15 AM  Hepatic Function  Total Protein 6.5 - 8.1 g/dL 6.9  6.8  6.8   Albumin 3.5 - 5.0 g/dL 3.9  3.9  3.9   AST 15 - 41 U/L 38  35  34   ALT 0 - 44 U/L 49  46  45   Alk Phosphatase 38 - 126 U/L 82  70  74   Total Bilirubin 0.3 - 1.2 mg/dL 0.5  0.7  1.0      STUDIES: DG Chest Port 1 View  Result Date: 07/24/2022 CLINICAL DATA:  Dyspnea on exertion EXAM: PORTABLE CHEST 1 VIEW COMPARISON:  Chest  x-ray 02/21/2022 FINDINGS: Right chest port catheter tip projects over the SVC. Left-sided pacemaker again noted. Sternotomy wires and mediastinal clips are present. The cardiomediastinal silhouette is within normal limits. The lungs are clear. There is no pleural effusion or pneumothorax. No acute fractures are seen. IMPRESSION: No active disease. Electronically Signed   By: Darliss Cheney M.D.   On: 07/24/2022 17:47      Impression / Plan:   Deronte Solis is a 62 y.o. male with history of cholangiocarcinoma, previously on Xeloda, currently on gemcitabine, last treatment on 07/19/2022 is admitted with severe pancytopenia.  No evidence of active GI bleed.   Severe pancytopenia is secondary to gemcitabine leading to bone marrow suppression Do not recommend any GI procedures at this time in setting of severe neutropenia Recommend to stop oral iron as there is no evidence of iron deficiency Patient can receive IV iron as needed through his oncologist Resume diet Okay to  resume aspirin and Eliquis from GI standpoint   Thank you for involving me in the care of this patient.  GI will sign off at this time, please call us back with questions or concerns    LOS: 1 day   Lannette Donath, MD  07/25/2022, 10:52 AM    Note: This dictation was prepared with Dragon dictation along with smaller phrase technology. Any transcriptional errors that result from this process are unintentional.

## 2022-07-25 NOTE — ED Notes (Signed)
Katy Foust notified of WBC of 1.2 and Hgb of 7.2 after one unit of PRBC's.

## 2022-07-26 ENCOUNTER — Inpatient Hospital Stay: Payer: 59

## 2022-07-26 ENCOUNTER — Other Ambulatory Visit: Payer: 59

## 2022-07-26 ENCOUNTER — Ambulatory Visit: Payer: 59

## 2022-07-26 DIAGNOSIS — C779 Secondary and unspecified malignant neoplasm of lymph node, unspecified: Secondary | ICD-10-CM | POA: Diagnosis not present

## 2022-07-26 DIAGNOSIS — D61818 Other pancytopenia: Secondary | ICD-10-CM | POA: Diagnosis not present

## 2022-07-26 DIAGNOSIS — D649 Anemia, unspecified: Secondary | ICD-10-CM | POA: Diagnosis not present

## 2022-07-26 LAB — CBC WITH DIFFERENTIAL/PLATELET
Abs Immature Granulocytes: 0.01 10*3/uL (ref 0.00–0.07)
Basophils Absolute: 0 10*3/uL (ref 0.0–0.1)
Basophils Relative: 1 %
Eosinophils Absolute: 0 10*3/uL (ref 0.0–0.5)
Eosinophils Relative: 1 %
HCT: 26.3 % — ABNORMAL LOW (ref 39.0–52.0)
Hemoglobin: 9.2 g/dL — ABNORMAL LOW (ref 13.0–17.0)
Immature Granulocytes: 1 %
Lymphocytes Relative: 15 %
Lymphs Abs: 0.2 10*3/uL — ABNORMAL LOW (ref 0.7–4.0)
MCH: 33.3 pg (ref 26.0–34.0)
MCHC: 35 g/dL (ref 30.0–36.0)
MCV: 95.3 fL (ref 80.0–100.0)
Monocytes Absolute: 0.1 10*3/uL (ref 0.1–1.0)
Monocytes Relative: 7 %
Neutro Abs: 1.1 10*3/uL — ABNORMAL LOW (ref 1.7–7.7)
Neutrophils Relative %: 75 %
Platelets: 55 10*3/uL — ABNORMAL LOW (ref 150–400)
RBC: 2.76 MIL/uL — ABNORMAL LOW (ref 4.22–5.81)
RDW: 15.6 % — ABNORMAL HIGH (ref 11.5–15.5)
WBC: 1.5 10*3/uL — ABNORMAL LOW (ref 4.0–10.5)
nRBC: 1.4 % — ABNORMAL HIGH (ref 0.0–0.2)

## 2022-07-26 LAB — TYPE AND SCREEN
ABO/RH(D): A POS
Unit division: 0
Unit division: 0

## 2022-07-26 LAB — BPAM RBC
Blood Product Expiration Date: 202405112359
Blood Product Expiration Date: 202405242359
ISSUE DATE / TIME: 202404301004
ISSUE DATE / TIME: 202404301157
Unit Type and Rh: 6200

## 2022-07-26 NOTE — Discharge Summary (Addendum)
Physician Discharge Summary   Patient: Edward Pitts MRN: [id number] DOB: 1960/06/10  Admit date:     07/24/2022  Discharge date: 07/26/22  Discharge Physician: Marrion Coy   PCP: Miki Kins, FNP   Recommendations at discharge:   Follow-up with PCP in 1 week. Follow-up with oncology in 1 week.  Discharge Diagnoses: Principal Problem:   Symptomatic anemia Active Problems:   Anxiety   HTN (hypertension)   HLD (hyperlipidemia)   Complete heart block (HCC)   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)   Stage 3a chronic kidney disease (HCC)   Hyperkalemia   Benign hypertensive kidney disease with chronic kidney disease   S/P CABG x 3   Coronary artery disease   Cholangiocarcinoma (HCC)   Chemotherapy induced neutropenia (HCC)   Pancytopenia (HCC) Morbid obesity.  BMI 39.16 with comorbidities. Resolved Problems:   * No resolved hospital problems. 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  Upon admission noted to have hemoglobin of 7, given 1 unit PRBC transfusion  Hemoglobin has been better in the 9.2 today.  Patient did not have evidence of GI bleed.  Patient was seen by oncology, deemed that acute anemia was due to chemotherapy.  Oncology canceled GI consult. At this point, patient is medically stable to be discharged. Assessment and Plan:  * Symptomatic anemia Thrombocytopenia Mild neutropenia Per oncology, this is caused by chemotherapy.  No additional workup with recommended.  At this point, condition has been stable,will follow-up with oncology as outpatient.   Cholangiocarcinoma Malignant melanoma Follows outpatient Dr. Cathie Hoops.   Stage 3a chronic kidney disease (HCC) Hyperkalemia. Potassium has normalized, renal  function stable.  Will hold off lisinopril for now.   HLD (hyperlipidemia) Lipitor   HTN (hypertension) Continue metoprolol, blood pressure not significant elevated, will hold lisinopril.  CAD S/P CABG x 3; 62 2023 Aspirin and Eliquis.      Consultants: Oncology. Procedures performed: None  Disposition: Home Diet recommendation:  Discharge Diet Orders (From admission, onward)     Start     Ordered   07/26/22 0000  Diet - low sodium heart healthy        07/26/22 1028           Cardiac diet DISCHARGE MEDICATION: Allergies as of 07/26/2022       Reactions   Ibuprofen Other (See Comments)   Affects kidneys   Levetiracetam    Other reaction(s): Dizziness, Headache   Nsaids    Affects kidneys   Capecitabine Rash        Medication List     STOP taking these medications    capecitabine 500 MG tablet Commonly known as: XELODA   febuxostat 40 MG tablet Commonly known as: ULORIC   lisinopril 10 MG tablet Commonly known as: ZESTRIL   traZODone 50 MG tablet Commonly known as: DESYREL   zinc oxide 20 % ointment       TAKE these medications    aspirin EC 81 MG tablet Take 81 mg by mouth daily.   atorvastatin 20 MG tablet Commonly known as: LIPITOR Take 20 mg by mouth daily.   clonazePAM 0.5 MG tablet Commonly known as: KLONOPIN TAKE 1 TABLET BY MOUTH IN THE MORNING AND 1 BY MOUTH IN THE EVENING per pt   clotrimazole 1 %  cream Commonly known as: Clotrimazole Anti-Fungal Apply 1 Application topically 2 (two) times daily. Apply to the L inguinal fold   Eliquis 5 MG Tabs tablet Generic drug: apixaban Take 5 mg by mouth 2 (two) times daily.   ferrous sulfate 325 (65 FE) MG EC tablet Take 1 tablet (325 mg total) by mouth daily.   hydrocortisone 2.5 % ointment Apply 1 application. topically daily as needed (itching).   Lacosamide 100 MG Tabs Take 1 tablet (100 mg total) by mouth in the morning and at bedtime.   lidocaine-prilocaine  cream Commonly known as: EMLA Apply 1 application. topically as needed. Apply small amount of cream to port site approx 1-2 hours prior to appointment.   metoprolol succinate 25 MG 24 hr tablet Commonly known as: TOPROL-XL Take 25 mg by mouth daily.   multivitamin with minerals Tabs tablet Take 1 tablet by mouth daily. Centrum Silver   nystatin powder Commonly known as: MYCOSTATIN/NYSTOP Apply 1 Application topically 3 (three) times daily.   omeprazole 20 MG capsule Commonly known as: PRILOSEC TAKE 1 CAPSULE BY MOUTH EVERY DAY   ondansetron 4 MG tablet Commonly known as: ZOFRAN TAKE 1 TABLET BY MOUTH EVERY 8 HOURS AS NEEDED FOR NAUSEA AND VOMITING   Refresh Optive Mega-3 0.5-1-0.5 % Soln Generic drug: Carboxymeth-Glyc-Polysorb PF Place 1 drop into both eyes daily as needed (dry eyes).        Follow-up Information     Miki Kins, FNP Follow up in 1 week(s).   Specialty: Family Medicine Contact information: [address] [place] [address] [phone]         Rickard Patience, MD Follow up in 1 week(s).   Specialty: Oncology Contact information: [address] [place] [address] [phone]                Discharge Exam: Ceasar Mons Weights   07/24/22 1039  Weight: 113.4 kg   General exam: Appears calm and comfortable  Respiratory system: Clear to auscultation. Respiratory effort normal. Cardiovascular system: S1 & S2 heard, RRR. No JVD, murmurs, rubs, gallops or clicks. No pedal edema. Gastrointestinal system: Abdomen is nondistended, soft and nontender. No organomegaly or masses felt. Normal bowel sounds heard. Central nervous system: Alert and oriented. No focal neurological deficits. Extremities: Symmetric 5 x 5 power. Skin: No rashes, lesions or ulcers Psychiatry: Judgement and insight appear normal. Mood & affect appropriate.    Condition at discharge: good  The results of significant diagnostics from this hospitalization (including  imaging, microbiology, ancillary and laboratory) are listed below for reference.   Imaging Studies: DG Chest Port 1 View  Result Date: 07/24/2022 CLINICAL DATA:  Dyspnea on exertion EXAM: PORTABLE CHEST 1 VIEW COMPARISON:  Chest x-ray 02/21/2022 FINDINGS: Right chest port catheter tip projects over the SVC. Left-sided pacemaker again noted. Sternotomy wires and mediastinal clips are present. The cardiomediastinal silhouette is within normal limits. The lungs are clear. There is no pleural effusion or pneumothorax. No acute fractures are seen. IMPRESSION: No active disease. Electronically Signed   By: Darliss Cheney M.D.   On: 07/24/2022 17:47    Microbiology: Results for orders placed or performed during the hospital encounter of 08/08/21  Surgical pcr screen     Status: None   Collection Time: 08/09/21  2:49 PM   Specimen: Nasal Mucosa; Nasal Swab  Result Value Ref Range Status   MRSA, PCR NEGATIVE NEGATIVE Final   Staphylococcus aureus NEGATIVE NEGATIVE Final    Comment: (NOTE) The Xpert SA Assay (  FDA approved for NASAL specimens in patients 45 years of age and older), is one component of a comprehensive surveillance program. It is not intended to diagnose infection nor to guide or monitor treatment. Performed at [facility], [address] [place], [place] [address]   Resp Panel by RT-PCR (Flu A&B, Covid) Nasopharyngeal Swab     Status: None   Collection Time: 08/09/21  7:49 PM   Specimen: Nasopharyngeal Swab; Nasopharyngeal(NP) swabs in vial transport medium  Result Value Ref Range Status   SARS Coronavirus 2 by RT PCR NEGATIVE NEGATIVE Final    Comment: (NOTE) SARS-CoV-2 target nucleic acids are NOT DETECTED.  The SARS-CoV-2 RNA is generally detectable in upper respiratory specimens during the acute phase of infection. The lowest concentration of SARS-CoV-2 viral copies this assay can detect is 138 copies/mL. A negative result does not preclude SARS-Cov-2 infection and  should not be used as the sole basis for treatment or other patient management decisions. A negative result may occur with  improper specimen collection/handling, submission of specimen other than nasopharyngeal swab, presence of viral mutation(s) within the areas targeted by this assay, and inadequate number of viral copies(<138 copies/mL). A negative result must be combined with clinical observations, patient history, and epidemiological information. The expected result is Negative.  Fact Sheet for Patients:  BloggerCourse.com  Fact Sheet for Healthcare Providers:  SeriousBroker.it  This test is no t yet approved or cleared by the [place] FDA and  has been authorized for detection and/or diagnosis of SARS-CoV-2 by FDA under an Emergency Use Authorization (EUA). This EUA will remain  in effect (meaning this test can be used) for the duration of the COVID-19 declaration under Section 564(b)(1) of the Act, 21 U.S.C.section 360bbb-3(b)(1), unless the authorization is terminated  or revoked sooner.       Influenza A by PCR NEGATIVE NEGATIVE Final   Influenza B by PCR NEGATIVE NEGATIVE Final    Comment: (NOTE) The Xpert Xpress SARS-CoV-2/FLU/RSV plus assay is intended as an aid in the diagnosis of influenza from Nasopharyngeal swab specimens and should not be used as a sole basis for treatment. Nasal washings and aspirates are unacceptable for Xpert Xpress SARS-CoV-2/FLU/RSV testing.  Fact Sheet for Patients: BloggerCourse.com  Fact Sheet for Healthcare Providers: SeriousBroker.it  This test is not yet approved or cleared by the [place] FDA and has been authorized for detection and/or diagnosis of SARS-CoV-2 by FDA under an Emergency Use Authorization (EUA). This EUA will remain in effect (meaning this test can be used) for the duration of the COVID-19 declaration  under Section 564(b)(1) of the Act, 21 U.S.C. section 360bbb-3(b)(1), unless the authorization is terminated or revoked.  Performed at [facility], [address] [place], [place] [address]     Labs: CBC: Recent Labs  Lab 07/24/22 1040 07/25/22 0445 07/25/22 1351 07/26/22 0435  WBC 2.1* 1.2*  1.2*  --  1.5*  NEUTROABS  --  0.9*  --  1.1*  HGB 7.0* 7.4*  7.2* 9.4* 9.2*  HCT 21.2* 22.3*  21.6* 27.6* 26.3*  MCV 103.4* 101.4*  100.9*  --  95.3  PLT 102* 76*  74*  --  55*   Basic Metabolic Panel: Recent Labs  Lab 07/24/22 1040 07/25/22 0445  NA 139 139  K 5.4* 5.0  CL 111 110  CO2 23 23  GLUCOSE 115* 109*  BUN 36* 26*  CREATININE 1.61* 1.32*  CALCIUM 9.0 8.8*   Liver Function Tests: Recent Labs  Lab 07/25/22 0445  AST 32  ALT 46*  ALKPHOS 69  BILITOT 1.1  PROT 6.1*  ALBUMIN 3.2*   CBG: No results for input(s): "GLUCAP" in the last 168 hours.  Discharge time spent: greater than 30 minutes.  Signed: Marrion Coy, MD [facility] 07/26/2022

## 2022-07-26 NOTE — Evaluation (Signed)
Occupational Therapy Evaluation Patient Details Name: Edward Pitts MRN: [id number] DOB: 1960/06/06 Today's Date: 07/26/2022   History of Present Illness Edward Pitts is a 61yoM who comes [facility] after noted orthostatic presyncope, known anemia. Pt evaluated after PRBC and fluids resusitation.   Clinical Impression   Upon entering the room, pt supine in bed and agreeable to OT evaluation. Pt reports he lives at home with wife and is Ind except needing some assistance with dressing L LE from prior surgery. Pt performs all bed mobility without assistance. Pt stands and ambulates in room to bathroom for toilet needs without assistance as well as demonstration of toilet transfer. Pt ambulates in hallway independently and asked to pick up an items from floor with no reported dizziness. Pt reports feeling close to baseline. Pt returns to room in same manner. Pt with no skilled acute OT needs at this time and pt agrees. OT to complete order.      Recommendations for follow up therapy are one component of a multi-disciplinary discharge planning process, led by the attending physician.  Recommendations may be updated based on patient status, additional functional criteria and insurance authorization.   Assistance Recommended at Discharge None     Functional Status Assessment  Patient has not had a recent decline in their functional status  Equipment Recommendations  None recommended by OT       Precautions / Restrictions Precautions Precautions: Fall Restrictions Weight Bearing Restrictions: No      Mobility Bed Mobility Overal bed mobility: Independent                  Transfers Overall transfer level: Independent                        Balance Overall balance assessment: Independent                                         ADL either performed or assessed with clinical judgement   ADL Overall ADL's : Independent                                              Vision Baseline Vision/History: 1 Wears glasses Patient Visual Report: No change from baseline              Pertinent Vitals/Pain Pain Assessment Pain Assessment: No/denies pain     Hand Dominance Right   Extremity/Trunk Assessment Upper Extremity Assessment Upper Extremity Assessment: Overall WFL for tasks assessed   Lower Extremity Assessment Lower Extremity Assessment: Overall WFL for tasks assessed          Cognition Arousal/Alertness: Awake/alert Behavior During Therapy: WFL for tasks assessed/performed Overall Cognitive Status: Within Functional Limits for tasks assessed                                                  Home Living Family/patient expects to be discharged to:: Private residence Living Arrangements: Spouse/significant other Available Help at Discharge: Family;Available PRN/intermittently Type of Home: House Home Access: Stairs to enter [employer] of Steps: 1 Entrance Stairs-Rails: Right Home Layout: One level  Bathroom Shower/Tub: Tub/shower unit;Walk-in shower   Bathroom Toilet: Standard Bathroom Accessibility: Yes   Home Equipment: Agricultural consultant (2 wheels);Cane - single point;BSC/3in1;Wheelchair - manual          Prior Functioning/Environment Prior Level of Function : Needs assist             Mobility Comments: Independent, ADLs Comments: needs some assist with LB dressing from prior surgery. Drives.                 OT Goals(Current goals can be found in the care plan section) Acute Rehab OT Goals Patient Stated Goal: to go home OT Goal Formulation: With patient Time For Goal Achievement: 07/26/22 Potential to Achieve Goals: Fair  OT Frequency:         AM-PAC OT "6 Clicks" Daily Activity     Outcome Measure Help from another person eating meals?: None Help from another person taking care of personal grooming?: None Help from another person toileting,  which includes using toliet, bedpan, or urinal?: None Help from another person bathing (including washing, rinsing, drying)?: None Help from another person to put on and taking off regular upper body clothing?: None Help from another person to put on and taking off regular lower body clothing?: None 6 Click Score: 24   End of Session    Activity Tolerance: Patient tolerated treatment well Patient left: in bed;with call bell/phone within reach;with family/visitor present                   Time: 1055-1109 OT Time Calculation (min): 14 min Charges:  OT General Charges $OT Visit: 1 Visit OT Evaluation $OT Eval Low Complexity: 1 Low OT Treatments $Self Care/Home Management : 8-22 mins  Jackquline Denmark, MS, OTR/L , CBIS ascom [phone]  07/26/22, 12:49 PM

## 2022-07-26 NOTE — Evaluation (Signed)
Physical Therapy Evaluation Patient Details Name: Edward Pitts MRN: [id number] DOB: 12/02/1960 Today's Date: 07/26/2022  History of Present Illness  Edward Pitts is a 61yoM who comes [facility] after noted orthostatic presyncope, known anemia. Pt evaluated after PRBC and fluids resusitation.  Clinical Impression  Pt in bed on entry, agreeable to evaluation. Pt reports independent AMB to BR multiple times today, not having symptoms any more. Pt agreeable to limited community distance assessment to ascertain current functional status. Pt AMB >549ft at appropriate speed for activity, DOE as per baseline, no LOB, no device used. Per available information, pt appears to have returned to baseline status. No additional skilled services indicated at this time. PT signing off. No recommendations for services or equipment at DC.        Recommendations for follow up therapy are one component of a multi-disciplinary discharge planning process, led by the attending physician.  Recommendations may be updated based on patient status, additional functional criteria and insurance authorization.  Follow Up Recommendations       Assistance Recommended at Discharge None  Patient can return home with the following       Equipment Recommendations None recommended by PT  Recommendations for Other Services       Functional Status Assessment Patient has had a recent decline in their functional status and/or demonstrates limited ability to make significant improvements in function in a reasonable and predictable amount of time     Precautions / Restrictions Precautions Precautions: Fall Restrictions Weight Bearing Restrictions: No      Mobility  Bed Mobility Overal bed mobility: Independent                  Transfers Overall transfer level: Independent                      Ambulation/Gait   Gait Distance (Feet): 560 Feet Assistive device: None   Gait velocity: 1.47m/s.         Stairs            Wheelchair Mobility    Modified Rankin (Stroke Patients Only)       Balance                                             Pertinent Vitals/Pain Pain Assessment Pain Assessment: No/denies pain    Home Living Family/patient expects to be discharged to:: Private residence Living Arrangements: Spouse/significant other Available Help at Discharge: Family;Available PRN/intermittently Type of Home: House Home Access: Stairs to enter Entrance Stairs-Rails: Right Entrance Stairs-Number of Steps: 1   Home Layout: One level Home Equipment: Agricultural consultant (2 wheels);Cane - single point;BSC/3in1;Wheelchair - manual      Prior Function Prior Level of Function : Needs assist             Mobility Comments: Independent, ADLs Comments: needs some assist with LB dressing. Drives.     Hand Dominance   Dominant Hand: Right    Extremity/Trunk Assessment                Communication      Cognition  General Comments      Exercises     Assessment/Plan    PT Assessment Patient does not need any further PT services  PT Problem List         PT Treatment Interventions      PT Goals (Current goals can be found in the Care Plan section)  Acute Rehab PT Goals PT Goal Formulation: All assessment and education complete, DC therapy    Frequency       Co-evaluation               AM-PAC PT "6 Clicks" Mobility  Outcome Measure Help needed turning from your back to your side while in a flat bed without using bedrails?: None Help needed moving from lying on your back to sitting on the side of a flat bed without using bedrails?: None Help needed moving to and from a bed to a chair (including a wheelchair)?: None Help needed standing up from a chair using your arms (e.g., wheelchair or bedside chair)?: None Help needed to walk in hospital room?:  None Help needed climbing 3-5 steps with a railing? : None 6 Click Score: 24    End of Session Equipment Utilized During Treatment: Gait belt Activity Tolerance: Patient tolerated treatment well;No increased pain Patient left: in chair;with family/visitor present;with call bell/phone within reach Nurse Communication: Mobility status PT Visit Diagnosis: Difficulty in walking, not elsewhere classified (R26.2);Other abnormalities of gait and mobility (R26.89)    Time: 1000-1011 PT Time Calculation (min) (ACUTE ONLY): 11 min   Charges:   PT Evaluation $PT Eval Low Complexity: 1 Low         12:38 PM, 07/26/22 Rosamaria Lints, PT, DPT Physical Therapist - [facility]  [phone] (ASCOM)    Inda Mcglothen C 07/26/2022, 12:36 PM

## 2022-07-27 ENCOUNTER — Other Ambulatory Visit (HOSPITAL_COMMUNITY): Payer: Self-pay

## 2022-07-27 ENCOUNTER — Ambulatory Visit (INDEPENDENT_AMBULATORY_CARE_PROVIDER_SITE_OTHER): Payer: 59 | Admitting: Family

## 2022-07-27 VITALS — BP 112/62 | HR 81 | Ht 67.0 in | Wt 249.0 lb

## 2022-07-27 DIAGNOSIS — D6481 Anemia due to antineoplastic chemotherapy: Secondary | ICD-10-CM | POA: Diagnosis not present

## 2022-07-27 DIAGNOSIS — T451X5A Adverse effect of antineoplastic and immunosuppressive drugs, initial encounter: Secondary | ICD-10-CM | POA: Diagnosis not present

## 2022-07-28 ENCOUNTER — Encounter: Payer: Self-pay | Admitting: Family

## 2022-07-28 LAB — CBC WITH DIFF/PLATELET
Basophils Absolute: 0 10*3/uL (ref 0.0–0.2)
Basos: 0 %
EOS (ABSOLUTE): 0.1 10*3/uL (ref 0.0–0.4)
Eos: 3 %
Hematocrit: 29.1 % — ABNORMAL LOW (ref 37.5–51.0)
Hemoglobin: 9.9 g/dL — ABNORMAL LOW (ref 13.0–17.7)
Immature Grans (Abs): 0 10*3/uL (ref 0.0–0.1)
Immature Granulocytes: 1 %
Lymphocytes Absolute: 0.3 10*3/uL — ABNORMAL LOW (ref 0.7–3.1)
Lymphs: 12 %
MCH: 32.2 pg (ref 26.6–33.0)
MCHC: 34 g/dL (ref 31.5–35.7)
MCV: 95 fL (ref 79–97)
Monocytes Absolute: 0.3 10*3/uL (ref 0.1–0.9)
Monocytes: 11 %
NRBC: 4 % — ABNORMAL HIGH (ref 0–0)
Neutrophils Absolute: 1.7 10*3/uL (ref 1.4–7.0)
Neutrophils: 73 %
Platelets: 62 10*3/uL — CL (ref 150–450)
RBC: 3.07 x10E6/uL — ABNORMAL LOW (ref 4.14–5.80)
RDW: 15.2 % (ref 11.6–15.4)
WBC: 2.4 10*3/uL — CL (ref 3.4–10.8)

## 2022-08-01 ENCOUNTER — Other Ambulatory Visit: Payer: Self-pay

## 2022-08-01 DIAGNOSIS — C221 Intrahepatic bile duct carcinoma: Secondary | ICD-10-CM

## 2022-08-02 ENCOUNTER — Inpatient Hospital Stay: Payer: 59 | Attending: Oncology

## 2022-08-02 ENCOUNTER — Inpatient Hospital Stay: Payer: 59

## 2022-08-02 ENCOUNTER — Encounter: Payer: Self-pay | Admitting: Family

## 2022-08-02 ENCOUNTER — Encounter: Payer: Self-pay | Admitting: Oncology

## 2022-08-02 ENCOUNTER — Inpatient Hospital Stay (HOSPITAL_BASED_OUTPATIENT_CLINIC_OR_DEPARTMENT_OTHER): Payer: 59 | Admitting: Oncology

## 2022-08-02 ENCOUNTER — Other Ambulatory Visit: Payer: Self-pay

## 2022-08-02 VITALS — BP 125/68 | HR 86 | Temp 96.2°F | Resp 18 | Wt 252.5 lb

## 2022-08-02 DIAGNOSIS — I272 Pulmonary hypertension, unspecified: Secondary | ICD-10-CM | POA: Insufficient documentation

## 2022-08-02 DIAGNOSIS — N1831 Chronic kidney disease, stage 3a: Secondary | ICD-10-CM | POA: Insufficient documentation

## 2022-08-02 DIAGNOSIS — Z5111 Encounter for antineoplastic chemotherapy: Secondary | ICD-10-CM | POA: Insufficient documentation

## 2022-08-02 DIAGNOSIS — C221 Intrahepatic bile duct carcinoma: Secondary | ICD-10-CM | POA: Insufficient documentation

## 2022-08-02 DIAGNOSIS — D701 Agranulocytosis secondary to cancer chemotherapy: Secondary | ICD-10-CM | POA: Diagnosis not present

## 2022-08-02 DIAGNOSIS — T451X5A Adverse effect of antineoplastic and immunosuppressive drugs, initial encounter: Secondary | ICD-10-CM

## 2022-08-02 DIAGNOSIS — D631 Anemia in chronic kidney disease: Secondary | ICD-10-CM | POA: Diagnosis not present

## 2022-08-02 DIAGNOSIS — C438 Malignant melanoma of overlapping sites of skin: Secondary | ICD-10-CM

## 2022-08-02 DIAGNOSIS — Z85820 Personal history of malignant melanoma of skin: Secondary | ICD-10-CM | POA: Insufficient documentation

## 2022-08-02 LAB — CMP (CANCER CENTER ONLY)
ALT: 42 U/L (ref 0–44)
AST: 41 U/L (ref 15–41)
Albumin: 4 g/dL (ref 3.5–5.0)
Alkaline Phosphatase: 87 U/L (ref 38–126)
Anion gap: 7 (ref 5–15)
BUN: 21 mg/dL (ref 8–23)
CO2: 26 mmol/L (ref 22–32)
Calcium: 9.1 mg/dL (ref 8.9–10.3)
Chloride: 104 mmol/L (ref 98–111)
Creatinine: 1.27 mg/dL — ABNORMAL HIGH (ref 0.61–1.24)
GFR, Estimated: >60 mL/min (ref >60)
Glucose, Bld: 121 mg/dL — ABNORMAL HIGH (ref 70–99)
Potassium: 4.3 mmol/L (ref 3.5–5.1)
Sodium: 137 mmol/L (ref 135–145)
Total Bilirubin: 0.6 mg/dL (ref 0.3–1.2)
Total Protein: 7 g/dL (ref 6.5–8.1)

## 2022-08-02 LAB — CBC WITH DIFFERENTIAL (CANCER CENTER ONLY)
Abs Immature Granulocytes: 0.04 10*3/uL (ref 0.00–0.07)
Basophils Absolute: 0 10*3/uL (ref 0.0–0.1)
Basophils Relative: 1 %
Eosinophils Absolute: 0.1 10*3/uL (ref 0.0–0.5)
Eosinophils Relative: 1 %
HCT: 32.1 % — ABNORMAL LOW (ref 39.0–52.0)
Hemoglobin: 10.7 g/dL — ABNORMAL LOW (ref 13.0–17.0)
Immature Granulocytes: 1 %
Lymphocytes Relative: 8 %
Lymphs Abs: 0.3 10*3/uL — ABNORMAL LOW (ref 0.7–4.0)
MCH: 32.6 pg (ref 26.0–34.0)
MCHC: 33.3 g/dL (ref 30.0–36.0)
MCV: 97.9 fL (ref 80.0–100.0)
Monocytes Absolute: 0.6 10*3/uL (ref 0.1–1.0)
Monocytes Relative: 18 %
Neutro Abs: 2.5 10*3/uL (ref 1.7–7.7)
Neutrophils Relative %: 71 %
Platelet Count: 317 10*3/uL (ref 150–400)
RBC: 3.28 MIL/uL — ABNORMAL LOW (ref 4.22–5.81)
RDW: 16.1 % — ABNORMAL HIGH (ref 11.5–15.5)
WBC Count: 3.5 10*3/uL — ABNORMAL LOW (ref 4.0–10.5)
nRBC: 1.4 % — ABNORMAL HIGH (ref 0.0–0.2)

## 2022-08-02 LAB — IRON AND TIBC
Iron: 77 ug/dL (ref 45–182)
Saturation Ratios: 24 % (ref 17.9–39.5)
TIBC: 328 ug/dL (ref 250–450)
UIBC: 251 ug/dL

## 2022-08-02 LAB — FERRITIN: Ferritin: 277 ng/mL (ref 24–336)

## 2022-08-02 LAB — SAMPLE TO BLOOD BANK

## 2022-08-02 MED ORDER — SODIUM CHLORIDE 0.9 % IV SOLN
Freq: Once | INTRAVENOUS | Status: AC
  Filled 2022-08-02: qty 250

## 2022-08-02 MED ORDER — SODIUM CHLORIDE 0.9 % IV SOLN
900.0000 mg/m2 | Freq: Once | INTRAVENOUS | Status: AC
  Administered 2022-08-02: 2090 mg via INTRAVENOUS
  Filled 2022-08-02: qty 54.97

## 2022-08-02 MED ORDER — HEPARIN SOD (PORK) LOCK FLUSH 100 UNIT/ML IV SOLN
500.0000 [IU] | Freq: Once | INTRAVENOUS | Status: AC | PRN
  Administered 2022-08-02: 500 [IU]
  Filled 2022-08-02: qty 5

## 2022-08-02 MED ORDER — PROCHLORPERAZINE MALEATE 10 MG PO TABS
10.0000 mg | ORAL_TABLET | Freq: Once | ORAL | Status: AC
  Administered 2022-08-02: 10 mg via ORAL
  Filled 2022-08-02: qty 1

## 2022-08-02 NOTE — Assessment & Plan Note (Addendum)
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  Hold ferrous sulfate 325mg  daily as it changes his stool color. Repeat iron level today.  If ferritin is less than 200, will schedule patient to get IV Venofer.  Trend hemoglobin weekly.

## 2022-08-02 NOTE — Assessment & Plan Note (Signed)
Monitor ANC closely.  Neutropenia precaution.  

## 2022-08-02 NOTE — Progress Notes (Signed)
Established Patient Office Visit  Subjective:  Patient ID: Edward Pitts, male    DOB: 1960/07/22  Age: 62 y.o. MRN: [id number]  Chief Complaint  Patient presents with   Follow-up    Hospital follow up    Patient is here for hospital follow up.  He went to visit his mother in the hospital and got dizzy and almost passed out while he was there.  He ended up going to the ED, and was found to have extremely low BP and was anemic, so he was admitted.  This is a known side effect for his chemo, and he was discharged from the hospital with follow up for [place] here.   No other concerns at this time.   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   Family history of adverse reaction to anesthesia    PONV mother   GERD (gastroesophageal reflux disease)    History of complete heart block    PPM placed   Hyperlipidemia    Hypertension    LBBB (left bundle branch block)    Lymphedema of left leg    uses thigh high compression stockings   Melanoma (HCC) 2012   skin cancer, left thigh   OSA on CPAP    PONV (postoperative nausea and vomiting) 04/16/2019   Port-A-Cath in place    RIGHT chest wall   Presence of cardiac pacemaker    [employer]   Seizures (HCC)    still has episodes of dizziness. last event 1 month ago (march 2022) and will pass out. takes clonazepam    Past Surgical History:  Procedure Laterality Date   CORONARY ARTERY BYPASS GRAFT N/A 08/10/2021   Procedure: CORONARY ARTERY BYPASS GRAFTING (CABG) X 3 USING LEFT INTERNAL MAMMARY ARTERY AND RIGHT GREATER SAPHENOUS VEIN;  Surgeon: Lovett Sox, MD;  Location: [facility] OR;  Service: Open Heart Surgery;  Laterality: N/A;   CT RADIATION  THERAPY GUIDE     left groin   dental implant     permanent implant   ENDOVEIN HARVEST OF GREATER SAPHENOUS VEIN Right 08/10/2021   Procedure: ENDOVEIN HARVEST OF GREATER SAPHENOUS VEIN;  Surgeon: Lovett Sox, MD;  Location: [facility] OR;  Service: Open Heart Surgery;  Laterality: Right;   KNEE SURGERY Left    arthroscopy   LEFT HEART CATH AND CORONARY ANGIOGRAPHY Left 06/29/2017   Procedure: LEFT HEART CATH AND CORONARY ANGIOGRAPHY;  Surgeon: Lamar Blinks, MD;  Location: [facility] INVASIVE CV LAB;  Service: Cardiovascular;  Laterality: Left;   LEFT HEART CATH AND CORONARY ANGIOGRAPHY N/A 08/08/2021   Procedure: LEFT HEART CATH AND CORONARY ANGIOGRAPHY;  Surgeon: Lamar Blinks, MD;  Location: [facility] INVASIVE CV LAB;  Service: Cardiovascular;  Laterality: N/A;   LYMPH NODE DISSECTION Left 04/16/2019   Procedure: Left inguinal Lymph Node Dissection;  Surgeon: Almond Lint, MD;  Location: [facility] OR;  Service: General;  Laterality: Left;   MELANOMA EXCISION Left 04/16/2019   Procedure: MELANOMA EXCISION LEFT GROIN MASS;  Surgeon: Almond Lint, MD;  Location: [facility] OR;  Service: General;  Laterality: Left;   MELANOMA EXCISION WITH SENTINEL LYMPH  NODE BIOPSY Left 2012   Left calf    PACEMAKER INSERTION N/A 08/26/2018   Procedure: INSERTION PACEMAKER;  Surgeon: Marcina Millard, MD;  Location: [facility] ORS;  Service: Cardiovascular;  Laterality: N/A;   PORTA CATH INSERTION N/A 08/26/2019   Procedure: PORTA CATH INSERTION;  Surgeon: Renford Dills, MD;  Location: [facility] INVASIVE CV LAB;  Service: Cardiovascular;  Laterality: N/A;   SUPERFICIAL LYMPH NODE BIOPSY / EXCISION Left 2020   lymph nodes removed around left groin melanoma site   TEE WITHOUT CARDIOVERSION N/A 08/10/2021   Procedure: TRANSESOPHAGEAL ECHOCARDIOGRAM (TEE);  Surgeon: Lovett Sox, MD;  Location: [facility] OR;  Service: Open Heart Surgery;  Laterality: N/A;   TEMPORARY PACEMAKER N/A 08/25/2018   Procedure: TEMPORARY PACEMAKER;  Surgeon: Tonny Bollman, MD;  Location: [facility] INVASIVE CV LAB;  Service: Cardiovascular;  Laterality: N/A;   TOTAL HIP ARTHROPLASTY Left 07/14/2020   Procedure: TOTAL HIP ARTHROPLASTY;  Surgeon: Donato Heinz, MD;  Location: [facility] ORS;  Service: Orthopedics;  Laterality: Left;    Social History   Socioeconomic History   Marital status: Married    Spouse name: [name]    Number of children: 7   Years of education: 12   Highest education level: Not on file  Occupational History    Comment: disability  Tobacco Use   Smoking status: Never   Smokeless tobacco: Never  Vaping Use   Vaping Use: Never used  Substance and Sexual Activity   Alcohol use: No   Drug use: No   Sexual activity: Not Currently  Other Topics Concern   Not on file  Social History Narrative   Lives with  Wife,   Has 2 small dogs   Caffeine use: sodas (2 per day)      Out of work on disability.  Has a walk in shower. No stairs to climb   Oncology treatment ongoing. Uses port a cath for treatment.      pacemaker   Social Determinants of Health   Financial Resource Strain: Low Risk  (02/12/2019)   Overall Financial Resource Strain (CARDIA)    Difficulty of Paying Living Expenses: Not hard at all  Food Insecurity: Food Insecurity Present (02/21/2022)   Hunger Vital Sign    Worried About Running Out of Food in the Last Year: Sometimes true    Ran Out of Food in the Last Year: Often true  Transportation Needs: No Transportation Needs (07/25/2022)   PRAPARE - Administrator, Civil Service (Medical): No    Lack of Transportation (Non-Medical): No  Physical Activity: Unknown (02/12/2019)   Exercise Vital Sign    Days of Exercise per Week: 0 days    Minutes of Exercise per Session: Not on file  Stress: No Stress Concern Present (02/12/2019)   [employer] of Occupational Health - Occupational Stress Questionnaire    Feeling of Stress : Only a little  Social Connections: Unknown (02/12/2019)   Social Connection  and Isolation Panel [NHANES]    Frequency of Communication with Friends and Family: More than three times a week    Frequency of Social Gatherings with Friends and Family: Not on file    Attends Religious Services: Not on file    Active Member of Clubs or Organizations: Not on file    Attends Banker Meetings: Not on file    Marital Status: Married  Intimate Partner Violence: Not At Risk (07/25/2022)   Humiliation, Afraid, Rape, and Kick questionnaire    Fear of  Current or Ex-Partner: No    Emotionally Abused: No    Physically Abused: No    Sexually Abused: No    Family History  Problem Relation Age of Onset   Cancer Paternal Grandmother     Allergies  Allergen Reactions   Ibuprofen Other (See Comments)    Affects kidneys   Levetiracetam     Other reaction(s): Dizziness, Headache   Nsaids     Affects kidneys   Capecitabine Rash    Review of Systems  Constitutional:  Positive for malaise/fatigue.  All other systems reviewed and are negative.      Objective:   BP 112/62   Pulse 81   Ht 5\' 7"  (1.702 m)   Wt 249 lb (112.9 kg)   SpO2 95%   BMI 39.00 kg/m   Vitals:   07/27/22 1404  BP: 112/62  Pulse: 81  Height: 5\' 7"  (1.702 m)  Weight: 249 lb (112.9 kg)  SpO2: 95%  BMI (Calculated): 38.99    Physical Exam Vitals and nursing note reviewed.  Constitutional:      Appearance: Normal appearance. He is normal weight.  Eyes:     Pupils: Pupils are equal, round, and reactive to light.  Cardiovascular:     Rate and Rhythm: Normal rate and regular rhythm.     Pulses: Normal pulses.     Heart sounds: Normal heart sounds.  Pulmonary:     Effort: Pulmonary effort is normal.     Breath sounds: Normal breath sounds.  Neurological:     Mental Status: He is alert.  Psychiatric:        Mood and Affect: Mood normal.        Behavior: Behavior normal.        Thought Content: Thought content normal.        Judgment: Judgment normal.      Results for  orders placed or performed in visit on 07/27/22  CBC With Diff/Platelet  Result Value Ref Range   WBC 2.4 (LL) 3.4 - 10.8 x10E3/uL   RBC 3.07 (L) 4.14 - 5.80 x10E6/uL   Hemoglobin 9.9 (L) 13.0 - 17.7 g/dL   Hematocrit 40.9 (L) 81.1 - 51.0 %   MCV 95 79 - 97 fL   MCH 32.2 26.6 - 33.0 pg   MCHC 34.0 31.5 - 35.7 g/dL   RDW 91.4 78.2 - 95.6 %   Platelets 62 (LL) 150 - 450 x10E3/uL   Neutrophils 73 Not Estab. %   Lymphs 12 Not Estab. %   Monocytes 11 Not Estab. %   Eos 3 Not Estab. %   Basos 0 Not Estab. %   Neutrophils Absolute 1.7 1.4 - 7.0 x10E3/uL   Lymphocytes Absolute 0.3 (L) 0.7 - 3.1 x10E3/uL   Monocytes Absolute 0.3 0.1 - 0.9 x10E3/uL   EOS (ABSOLUTE) 0.1 0.0 - 0.4 x10E3/uL   Basophils Absolute 0.0 0.0 - 0.2 x10E3/uL   Immature Granulocytes 1 Not Estab. %   Immature Grans (Abs) 0.0 0.0 - 0.1 x10E3/uL   NRBC 4 (H) 0 - 0 %   Hematology Comments: Note:     Recent Results (from the past 2160 hour(s))  Comprehensive metabolic panel     Status: Abnormal   Collection Time: 05/16/22  9:54 AM  Result Value Ref Range   Sodium 136 135 - 145 mmol/L   Potassium 5.4 (H) 3.5 - 5.1 mmol/L   Chloride 109 98 - 111 mmol/L   CO2 20 (L) 22 - 32 mmol/L  Glucose, Bld 106 (H) 70 - 99 mg/dL    Comment: Glucose reference range applies only to samples taken after fasting for at least 8 hours.   BUN 39 (H) 8 - 23 mg/dL   Creatinine, Ser 1.61 (H) 0.61 - 1.24 mg/dL   Calcium 9.1 8.9 - 09.6 mg/dL   Total Protein 7.1 6.5 - 8.1 g/dL   Albumin 4.0 3.5 - 5.0 g/dL   AST 32 15 - 41 U/L   ALT 38 0 - 44 U/L   Alkaline Phosphatase 76 38 - 126 U/L   Total Bilirubin 0.5 0.3 - 1.2 mg/dL   GFR, Estimated 55 (L) >60 mL/min    Comment: (NOTE) Calculated using the CKD-EPI Creatinine Equation (2021)    Anion gap 7 5 - 15    Comment: Performed at [facility], [address] [place], [place] [address]  CBC with Differential/Platelet     Status: Abnormal   Collection Time: 05/16/22  9:54 AM  Result  Value Ref Range   WBC 3.9 (L) 4.0 - 10.5 K/uL   RBC 3.38 (L) 4.22 - 5.81 MIL/uL   Hemoglobin 10.8 (L) 13.0 - 17.0 g/dL   HCT 98.1 (L) 19.1 - 47.8 %   MCV 94.4 80.0 - 100.0 fL   MCH 32.0 26.0 - 34.0 pg   MCHC 33.9 30.0 - 36.0 g/dL   RDW 29.5 62.1 - 30.8 %   Platelets 162 150 - 400 K/uL   nRBC 0.0 0.0 - 0.2 %   Neutrophils Relative % 76 %   Neutro Abs 2.9 1.7 - 7.7 K/uL   Lymphocytes Relative 8 %   Lymphs Abs 0.3 (L) 0.7 - 4.0 K/uL   Monocytes Relative 13 %   Monocytes Absolute 0.5 0.1 - 1.0 K/uL   Eosinophils Relative 2 %   Eosinophils Absolute 0.1 0.0 - 0.5 K/uL   Basophils Relative 0 %   Basophils Absolute 0.0 0.0 - 0.1 K/uL   Immature Granulocytes 1 %   Abs Immature Granulocytes 0.04 0.00 - 0.07 K/uL    Comment: Performed at [facility], [address] [place], [place] [address]  Comprehensive metabolic panel     Status: Abnormal   Collection Time: 05/29/22  9:43 AM  Result Value Ref Range   Sodium 135 135 - 145 mmol/L   Potassium 5.1 3.5 - 5.1 mmol/L   Chloride 111 98 - 111 mmol/L   CO2 18 (L) 22 - 32 mmol/L   Glucose, Bld 110 (H) 70 - 99 mg/dL    Comment: Glucose reference range applies only to samples taken after fasting for at least 8 hours.   BUN 41 (H) 8 - 23 mg/dL   Creatinine, Ser 6.96 (H) 0.61 - 1.24 mg/dL   Calcium 8.8 (L) 8.9 - 10.3 mg/dL   Total Protein 6.9 6.5 - 8.1 g/dL   Albumin 3.8 3.5 - 5.0 g/dL   AST 32 15 - 41 U/L   ALT 40 0 - 44 U/L   Alkaline Phosphatase 74 38 - 126 U/L   Total Bilirubin 0.6 0.3 - 1.2 mg/dL   GFR, Estimated 57 (L) >60 mL/min    Comment: (NOTE) Calculated using the CKD-EPI Creatinine Equation (2021)    Anion gap 6 5 - 15    Comment: Performed at [facility], [address] [place], [place] [address]  CBC with Differential (Cancer Center Only)     Status: Abnormal   Collection Time: 05/29/22  9:43 AM  Result Value  Ref Range   WBC Count 4.7 4.0 - 10.5 K/uL   RBC 3.36 (L) 4.22 - 5.81 MIL/uL   Hemoglobin 11.0  (L) 13.0 - 17.0 g/dL   HCT 16.1 (L) 09.6 - 04.5 %   MCV 95.8 80.0 - 100.0 fL   MCH 32.7 26.0 - 34.0 pg   MCHC 34.2 30.0 - 36.0 g/dL   RDW 40.9 (H) 81.1 - 91.4 %   Platelet Count 136 (L) 150 - 400 K/uL   nRBC 0.0 0.0 - 0.2 %   Neutrophils Relative % 75 %   Neutro Abs 3.5 1.7 - 7.7 K/uL   Lymphocytes Relative 6 %   Lymphs Abs 0.3 (L) 0.7 - 4.0 K/uL   Monocytes Relative 15 %   Monocytes Absolute 0.7 0.1 - 1.0 K/uL   Eosinophils Relative 2 %   Eosinophils Absolute 0.1 0.0 - 0.5 K/uL   Basophils Relative 0 %   Basophils Absolute 0.0 0.0 - 0.1 K/uL   Immature Granulocytes 2 %   Abs Immature Granulocytes 0.08 (H) 0.00 - 0.07 K/uL    Comment: Performed at [facility], [address] [place], [place] [address]  Iron and TIBC     Status: None   Collection Time: 06/07/22  8:49 AM  Result Value Ref Range   Iron 116 45 - 182 ug/dL   TIBC 621 308 - 657 ug/dL   Saturation Ratios 31 17.9 - 39.5 %   UIBC 263 ug/dL    Comment: Performed at [facility], [address] [place], [place] [address]  Ferritin     Status: None   Collection Time: 06/07/22  8:49 AM  Result Value Ref Range   Ferritin 180 24 - 336 ng/mL    Comment: Performed at [facility], [address] [place], [place] [address]  CMP (Cancer Center only)     Status: Abnormal   Collection Time: 06/07/22  8:49 AM  Result Value Ref Range   Sodium 135 135 - 145 mmol/L   Potassium 4.6 3.5 - 5.1 mmol/L   Chloride 110 98 - 111 mmol/L   CO2 20 (L) 22 - 32 mmol/L   Glucose, Bld 171 (H) 70 - 99 mg/dL    Comment: Glucose reference range applies only to samples taken after fasting for at least 8 hours.   BUN 40 (H) 8 - 23 mg/dL   Creatinine 4.13 (H) 2.44 - 1.24 mg/dL   Calcium 9.0 8.9 - 01.0 mg/dL   Total Protein 6.9 6.5 - 8.1 g/dL   Albumin 3.9 3.5 - 5.0 g/dL   AST 35 15 - 41 U/L   ALT 55 (H) 0 - 44 U/L   Alkaline Phosphatase 79 38 - 126 U/L   Total Bilirubin 0.5 0.3 - 1.2 mg/dL   GFR, Estimated 53 (L)  >60 mL/min    Comment: (NOTE) Calculated using the CKD-EPI Creatinine Equation (2021)    Anion gap 5 5 - 15    Comment: Performed at [facility], [address] [place], [place] [address]  CBC with Differential (Cancer Center Only)     Status: Abnormal   Collection Time: 06/07/22  8:49 AM  Result Value Ref Range   WBC Count 3.2 (L) 4.0 - 10.5 K/uL   RBC 2.96 (L) 4.22 - 5.81 MIL/uL   Hemoglobin 9.8 (L) 13.0 - 17.0 g/dL   HCT 66.4 (L) 40.3 - 47.4 %   MCV 97.6 80.0 - 100.0 fL   MCH 33.1 26.0 -  34.0 pg   MCHC 33.9 30.0 - 36.0 g/dL   RDW 16.1 09.6 - 04.5 %   Platelet Count 162 150 - 400 K/uL   nRBC 0.0 0.0 - 0.2 %   Neutrophils Relative % 79 %   Neutro Abs 2.6 1.7 - 7.7 K/uL   Lymphocytes Relative 11 %   Lymphs Abs 0.3 (L) 0.7 - 4.0 K/uL   Monocytes Relative 7 %   Monocytes Absolute 0.2 0.1 - 1.0 K/uL   Eosinophils Relative 2 %   Eosinophils Absolute 0.1 0.0 - 0.5 K/uL   Basophils Relative 0 %   Basophils Absolute 0.0 0.0 - 0.1 K/uL   Immature Granulocytes 1 %   Abs Immature Granulocytes 0.03 0.00 - 0.07 K/uL    Comment: Performed at [facility], [address] [place], [place] [address]  CMP (Cancer Center only)     Status: Abnormal   Collection Time: 06/14/22  9:15 AM  Result Value Ref Range   Sodium 135 135 - 145 mmol/L   Potassium 4.6 3.5 - 5.1 mmol/L   Chloride 110 98 - 111 mmol/L   CO2 19 (L) 22 - 32 mmol/L   Glucose, Bld 149 (H) 70 - 99 mg/dL    Comment: Glucose reference range applies only to samples taken after fasting for at least 8 hours.   BUN 47 (H) 8 - 23 mg/dL   Creatinine 1.91 (H) 4.78 - 1.24 mg/dL   Calcium 8.8 (L) 8.9 - 10.3 mg/dL   Total Protein 6.8 6.5 - 8.1 g/dL   Albumin 3.9 3.5 - 5.0 g/dL   AST 34 15 - 41 U/L   ALT 45 (H) 0 - 44 U/L   Alkaline Phosphatase 74 38 - 126 U/L   Total Bilirubin 1.0 0.3 - 1.2 mg/dL   GFR, Estimated 42 (L) >60 mL/min    Comment: (NOTE) Calculated using the CKD-EPI Creatinine Equation (2021)    Anion  gap 6 5 - 15    Comment: Performed at [facility], [address] [place], [place] [address]  CBC with Differential (Cancer Center Only)     Status: Abnormal   Collection Time: 06/14/22  9:15 AM  Result Value Ref Range   WBC Count 6.4 4.0 - 10.5 K/uL   RBC 3.12 (L) 4.22 - 5.81 MIL/uL   Hemoglobin 10.3 (L) 13.0 - 17.0 g/dL   HCT 13.0 (L) 86.5 - 78.4 %   MCV 100.0 80.0 - 100.0 fL   MCH 33.0 26.0 - 34.0 pg   MCHC 33.0 30.0 - 36.0 g/dL   RDW 69.6 (H) 29.5 - 28.4 %   Platelet Count 172 150 - 400 K/uL   nRBC 0.5 (H) 0.0 - 0.2 %   Neutrophils Relative % 73 %   Neutro Abs 4.6 1.7 - 7.7 K/uL   Lymphocytes Relative 5 %   Lymphs Abs 0.4 (L) 0.7 - 4.0 K/uL   Monocytes Relative 16 %   Monocytes Absolute 1.0 0.1 - 1.0 K/uL   Eosinophils Relative 2 %   Eosinophils Absolute 0.1 0.0 - 0.5 K/uL   Basophils Relative 0 %   Basophils Absolute 0.0 0.0 - 0.1 K/uL   Immature Granulocytes 4 %   Abs Immature Granulocytes 0.28 (H) 0.00 - 0.07 K/uL    Comment: Performed at [facility], [address] [place], [place] [address]  Hemoglobin A1c     Status: Abnormal   Collection Time: 06/16/22  9:19 AM  Result Value Ref Range  Hgb A1c MFr Bld 5.8 (H) 4.8 - 5.6 %    Comment: (NOTE)         Prediabetes: 5.7 - 6.4         Diabetes: >6.4         Glycemic control for adults with diabetes: <7.0    Mean Plasma Glucose 120 mg/dL    Comment: (NOTE) Performed At: [facility] [address] [place], [place] [id number] Jolene Schimke MD WU:[id number]   Lipid Profile     Status: None   Collection Time: 06/16/22  9:19 AM  Result Value Ref Range   Cholesterol 140 0 - 200 mg/dL   Triglycerides 956 <213 mg/dL   HDL 42 >08 mg/dL   Total CHOL/HDL Ratio 3.3 RATIO   VLDL 25 0 - 40 mg/dL   LDL Cholesterol 73 0 - 99 mg/dL    Comment:        Total Cholesterol/HDL:CHD Risk Coronary Heart Disease Risk Table                     Men   Women  1/2 Average Risk   3.4   3.3  Average Risk        5.0   4.4  2 X Average Risk   9.6   7.1  3 X Average Risk  23.4   11.0        Use the calculated Patient Ratio above and the CHD Risk Table to determine the patient's CHD Risk.        ATP III CLASSIFICATION (LDL):  <100     mg/dL   Optimal  657-846  mg/dL   Near or Above                    Optimal  130-159  mg/dL   Borderline  962-952  mg/dL   High  >841     mg/dL   Very High Performed at [facility], [address] [place], [place] [address]   Basic Metabolic Panel - Cancer Center Only     Status: Abnormal   Collection Time: 06/16/22  9:19 AM  Result Value Ref Range   Sodium 134 (L) 135 - 145 mmol/L   Potassium 5.2 (H) 3.5 - 5.1 mmol/L   Chloride 110 98 - 111 mmol/L   CO2 18 (L) 22 - 32 mmol/L   Glucose, Bld 120 (H) 70 - 99 mg/dL    Comment: Glucose reference range applies only to samples taken after fasting for at least 8 hours.   BUN 46 (H) 8 - 23 mg/dL   Creatinine 1.02 (H) 7.25 - 1.24 mg/dL   Calcium 8.8 (L) 8.9 - 10.3 mg/dL   GFR, Estimated 53 (L) >60 mL/min    Comment: (NOTE) Calculated using the CKD-EPI Creatinine Equation (2021)    Anion gap 6 5 - 15    Comment: Performed at [facility], [address] [place], [place] [address]  CMP (Cancer Center only)     Status: Abnormal   Collection Time: 07/10/22  9:42 AM  Result Value Ref Range   Sodium 138 135 - 145 mmol/L   Potassium 5.3 (H) 3.5 - 5.1 mmol/L   Chloride 111 98 - 111 mmol/L   CO2 22 22 - 32 mmol/L   Glucose, Bld 131 (H) 70 - 99 mg/dL    Comment: Glucose reference range applies only to samples taken after fasting for at least 8 hours.   BUN 35 (  H) 8 - 23 mg/dL   Creatinine 1.61 (H) 0.96 - 1.24 mg/dL   Calcium 9.4 8.9 - 04.5 mg/dL   Total Protein 6.8 6.5 - 8.1 g/dL   Albumin 3.9 3.5 - 5.0 g/dL   AST 35 15 - 41 U/L   ALT 46 (H) 0 - 44 U/L   Alkaline Phosphatase 70 38 - 126 U/L   Total Bilirubin 0.7 0.3 - 1.2 mg/dL   GFR, Estimated 53 (L) >60 mL/min    Comment: (NOTE) Calculated  using the CKD-EPI Creatinine Equation (2021)    Anion gap 5 5 - 15    Comment: Performed at [facility], [address] [place], [place] [address]  CBC with Differential (Cancer Center Only)     Status: Abnormal   Collection Time: 07/10/22  9:42 AM  Result Value Ref Range   WBC Count 4.4 4.0 - 10.5 K/uL   RBC 2.70 (L) 4.22 - 5.81 MIL/uL   Hemoglobin 9.3 (L) 13.0 - 17.0 g/dL   HCT 19.1 (L) 47.8 - 29.5 %   MCV 103.7 (H) 80.0 - 100.0 fL   MCH 34.4 (H) 26.0 - 34.0 pg   MCHC 33.2 30.0 - 36.0 g/dL   RDW 62.1 (H) 30.8 - 65.7 %   Platelet Count 179 150 - 400 K/uL   nRBC 0.5 (H) 0.0 - 0.2 %   Neutrophils Relative % 71 %   Neutro Abs 3.1 1.7 - 7.7 K/uL   Lymphocytes Relative 8 %   Lymphs Abs 0.3 (L) 0.7 - 4.0 K/uL   Monocytes Relative 16 %   Monocytes Absolute 0.7 0.1 - 1.0 K/uL   Eosinophils Relative 3 %   Eosinophils Absolute 0.1 0.0 - 0.5 K/uL   Basophils Relative 1 %   Basophils Absolute 0.0 0.0 - 0.1 K/uL   Immature Granulocytes 1 %   Abs Immature Granulocytes 0.06 0.00 - 0.07 K/uL    Comment: Performed at [facility], [address] [place], [place] [address]  CMP (Cancer Center only)     Status: Abnormal   Collection Time: 07/19/22  9:03 AM  Result Value Ref Range   Sodium 135 135 - 145 mmol/L   Potassium 4.5 3.5 - 5.1 mmol/L   Chloride 108 98 - 111 mmol/L   CO2 19 (L) 22 - 32 mmol/L   Glucose, Bld 145 (H) 70 - 99 mg/dL    Comment: Glucose reference range applies only to samples taken after fasting for at least 8 hours.   BUN 36 (H) 8 - 23 mg/dL   Creatinine 2.95 (H) 2.84 - 1.24 mg/dL   Calcium 8.8 (L) 8.9 - 10.3 mg/dL   Total Protein 6.9 6.5 - 8.1 g/dL   Albumin 3.9 3.5 - 5.0 g/dL   AST 38 15 - 41 U/L   ALT 49 (H) 0 - 44 U/L   Alkaline Phosphatase 82 38 - 126 U/L   Total Bilirubin 0.5 0.3 - 1.2 mg/dL   GFR, Estimated 50 (L) >60 mL/min    Comment: (NOTE) Calculated using the CKD-EPI Creatinine Equation (2021)    Anion gap 8 5 - 15    Comment:  Performed at [facility], [address] [place], [place] [address]  CBC with Differential (Cancer Center Only)     Status: Abnormal   Collection Time: 07/19/22  9:03 AM  Result Value Ref Range   WBC Count 2.4 (L) 4.0 - 10.5 K/uL   RBC 2.33 (L) 4.22 - 5.81 MIL/uL  Hemoglobin 8.2 (L) 13.0 - 17.0 g/dL   HCT 09.8 (L) 11.9 - 14.7 %   MCV 101.3 (H) 80.0 - 100.0 fL   MCH 35.2 (H) 26.0 - 34.0 pg   MCHC 34.7 30.0 - 36.0 g/dL   RDW 82.9 56.2 - 13.0 %   Platelet Count 112 (L) 150 - 400 K/uL   nRBC 3.3 (H) 0.0 - 0.2 %   Neutrophils Relative % 67 %   Neutro Abs 1.6 (L) 1.7 - 7.7 K/uL   Lymphocytes Relative 12 %   Lymphs Abs 0.3 (L) 0.7 - 4.0 K/uL   Monocytes Relative 14 %   Monocytes Absolute 0.3 0.1 - 1.0 K/uL   Eosinophils Relative 1 %   Eosinophils Absolute 0.0 0.0 - 0.5 K/uL   Basophils Relative 1 %   Basophils Absolute 0.0 0.0 - 0.1 K/uL   Immature Granulocytes 5 %   Abs Immature Granulocytes 0.11 (H) 0.00 - 0.07 K/uL    Comment: Performed at [facility], [address] [place], [place] [address]  Retic Panel     Status: Abnormal   Collection Time: 07/19/22  9:03 AM  Result Value Ref Range   Retic Ct Pct 2.6 0.4 - 3.1 %   RBC. 2.36 (L) 4.22 - 5.81 MIL/uL   Retic Count, Absolute 61.1 19.0 - 186.0 K/uL   Immature Retic Fract 36.6 (H) 2.3 - 15.9 %   Reticulocyte Hemoglobin 35.1 >27.9 pg    Comment:        Given the high negative predictive value of a RET-He result > 32 pg iron deficiency is essentially excluded. If this patient is anemic other etiologies should be considered. Performed at [facility], [address] [place], [place] [address]   CBC     Status: Abnormal   Collection Time: 07/24/22 10:40 AM  Result Value Ref Range   WBC 2.1 (L) 4.0 - 10.5 K/uL   RBC 2.05 (L) 4.22 - 5.81 MIL/uL   Hemoglobin 7.0 (L) 13.0 - 17.0 g/dL   HCT 95.2 (L) 84.1 - 32.4 %   MCV 103.4 (H) 80.0 - 100.0 fL   MCH 34.1 (H) 26.0 - 34.0 pg   MCHC 33.0 30.0 - 36.0 g/dL    RDW 40.1 02.7 - 25.3 %   Platelets 102 (L) 150 - 400 K/uL    Comment: Immature Platelet Fraction may be clinically indicated, consider ordering this additional test [id number]    nRBC 0.0 0.0 - 0.2 %    Comment: Performed at [facility], [address] [place], [place] [address]  Basic metabolic panel     Status: Abnormal   Collection Time: 07/24/22 10:40 AM  Result Value Ref Range   Sodium 139 135 - 145 mmol/L   Potassium 5.4 (H) 3.5 - 5.1 mmol/L   Chloride 111 98 - 111 mmol/L   CO2 23 22 - 32 mmol/L   Glucose, Bld 115 (H) 70 - 99 mg/dL    Comment: Glucose reference range applies only to samples taken after fasting for at least 8 hours.   BUN 36 (H) 8 - 23 mg/dL   Creatinine, Ser 6.38 (H) 0.61 - 1.24 mg/dL   Calcium 9.0 8.9 - 75.6 mg/dL   GFR, Estimated 48 (L) >60 mL/min    Comment: (NOTE) Calculated using the CKD-EPI Creatinine Equation (2021)    Anion gap 5 5 - 15    Comment: Performed at [facility], [address] [place], [place] [address]  Prepare RBC (crossmatch)     Status: None   Collection Time: 07/24/22  1:45 PM  Result Value Ref Range   Order Confirmation      ORDER PROCESSED BY BLOOD BANK Performed at [facility], [address] [place], [place] [address]   Type and screen [facility]     Status: None   Collection Time: 07/24/22  3:38 PM  Result Value Ref Range   ABO/RH(D) A POS    Antibody Screen NEG    Sample Expiration 07/27/2022,2359    Unit Number [id number]    Blood Component Type RBC LR PHER1    Unit division 00    Status of Unit ISSUED,FINAL    Transfusion Status OK TO TRANSFUSE    Crossmatch Result Compatible    Unit Number [id number]    Blood Component Type RBC LR PHER2    Unit division 00    Status of Unit ISSUED,FINAL    Transfusion Status OK TO TRANSFUSE    Crossmatch Result Compatible    Unit Number [id number]    Blood Component Type RBC LR PHER2    Unit division 00     Status of Unit ISSUED,FINAL    Transfusion Status OK TO TRANSFUSE    Crossmatch Result      Compatible Performed at [facility], [address] [place], [place] [address]   BPAM RBC     Status: None   Collection Time: 07/24/22  3:38 PM  Result Value Ref Range   ISSUE DATE / TIME 644034742595    Blood Product Unit Number [id number]    PRODUCT CODE [id number]    Unit Type and Rh 0600    Blood Product Expiration Date 301601093235    ISSUE DATE / TIME 573220254270    Blood Product Unit Number [id number]    PRODUCT CODE H6073X10    Unit Type and Rh 6200    Blood Product Expiration Date 626948546270    ISSUE DATE / TIME 350093818299    Blood Product Unit Number [id number]    PRODUCT CODE [id number]    Unit Type and Rh 6200    Blood Product Expiration Date 527782423536   Basic metabolic panel     Status: Abnormal   Collection Time: 07/25/22  4:45 AM  Result Value Ref Range   Sodium 139 135 - 145 mmol/L   Potassium 5.0 3.5 - 5.1 mmol/L   Chloride 110 98 - 111 mmol/L   CO2 23 22 - 32 mmol/L   Glucose, Bld 109 (H) 70 - 99 mg/dL    Comment: Glucose reference range applies only to samples taken after fasting for at least 8 hours.   BUN 26 (H) 8 - 23 mg/dL   Creatinine, Ser 1.44 (H) 0.61 - 1.24 mg/dL   Calcium 8.8 (L) 8.9 - 10.3 mg/dL   GFR, Estimated >31 >54 mL/min    Comment: (NOTE) Calculated using the CKD-EPI Creatinine Equation (2021)    Anion gap 6 5 - 15    Comment: Performed at [facility], [address] [place], [place] [address]  CBC     Status: Abnormal   Collection Time: 07/25/22  4:45 AM  Result Value Ref Range   WBC 1.2 (LL) 4.0 - 10.5 K/uL    Comment: This critical result has verified and been called to [facility] by Arelia Longest on 04 30 2024 at 0536, and has been read back.    RBC 2.14 (L) 4.22 - 5.81 MIL/uL  Hemoglobin 7.2 (L) 13.0 - 17.0 g/dL   HCT 95.6 (L) 21.3 - 08.6 %   MCV 100.9 (H) 80.0 - 100.0 fL   MCH 33.6 26.0 - 34.0  pg   MCHC 33.3 30.0 - 36.0 g/dL   RDW 57.8 46.9 - 62.9 %   Platelets 74 (L) 150 - 400 K/uL    Comment: Immature Platelet Fraction may be clinically indicated, consider ordering this additional test [id number]    nRBC 0.0 0.0 - 0.2 %    Comment: Performed at [facility], [address], [place], [place] [address]  Technologist smear review     Status: None   Collection Time: 07/25/22  4:45 AM  Result Value Ref Range   WBC MORPHOLOGY MORPHOLOGY UNREMARKABLE    RBC MORPHOLOGY MORPHOLOGY UNREMARKABLE    Plt Morphology Normal platelet morphology    Clinical Information      anemia neutropenia. please submit for pathologist smear    Comment: Performed at [facility], [address] [place], [place] [address]  CBC with Differential/Platelet     Status: Abnormal   Collection Time: 07/25/22  4:45 AM  Result Value Ref Range   WBC 1.2 (LL) 4.0 - 10.5 K/uL    Comment: CRITICAL VALUE NOTED.  VALUE IS CONSISTENT WITH PREVIOUSLY REPORTED AND CALLED VALUE. THIS CRITICAL RESULT HAS VERIFIED AND BEEN CALLED TO [facility]ESS[facility] MEDISON BY HINA PATEL ON 04 30 2024 AT 1129, AND HAS BEEN READ BACK.     RBC 2.20 (L) 4.22 - 5.81 MIL/uL   Hemoglobin 7.4 (L) 13.0 - 17.0 g/dL   HCT 64.4 (L) 03.4 - 74.2 %   MCV 101.4 (H) 80.0 - 100.0 fL   MCH 33.6 26.0 - 34.0 pg   MCHC 33.2 30.0 - 36.0 g/dL   RDW 59.5 63.8 - 75.6 %   Platelets 76 (L) 150 - 400 K/uL    Comment: Immature Platelet Fraction may be clinically indicated, consider ordering this additional test [id number]    nRBC 0.0 0.0 - 0.2 %   Neutrophils Relative % 75 %   Neutro Abs 0.9 (L) 1.7 - 7.7 K/uL   Lymphocytes Relative 19 %   Lymphs Abs 0.2 (L) 0.7 - 4.0 K/uL   Monocytes Relative 3 %   Monocytes Absolute 0.0 (L) 0.1 - 1.0 K/uL   Eosinophils Relative 2 %   Eosinophils Absolute 0.0 0.0 - 0.5 K/uL   Basophils Relative 0 %   Basophils Absolute 0.0 0.0 - 0.1 K/uL   WBC Morphology MORPHOLOGY UNREMARKABLE    RBC Morphology MORPHOLOGY  UNREMARKABLE    Smear Review Normal platelet morphology    Immature Granulocytes 1 %   Abs Immature Granulocytes 0.01 0.00 - 0.07 K/uL    Comment: Performed at [facility], [address] [place], [place] [address]  Pathologist smear review     Status: None   Collection Time: 07/25/22  4:45 AM  Result Value Ref Range   Path Review Blood smear is reviewed.     Comment: Pancytopenia. Absolute leukopenia, with normal differential, and ANC of 900 per uL. No circulating blasts identified. Macrocytic anemia, with mild anisocytosis. Thrombocytopenia, with unremarkable platelet morphology. The patient's history of malignancy is noted, and pancytopenia is likely secondary to recent chemotherapeutic intervention. Close clinical followup is recommended. Reviewed by Beryle Quant, M.D. Performed at [facility], [address] [place], [place] [address]   Hepatic function panel     Status: Abnormal   Collection Time: 07/25/22  4:45 AM  Result Value Ref Range   Total Protein 6.1 (L) 6.5 - 8.1 g/dL   Albumin 3.2 (L) 3.5 - 5.0 g/dL   AST 32 15 - 41 U/L   ALT 46 (H) 0 - 44 U/L   Alkaline Phosphatase 69 38 - 126 U/L   Total Bilirubin 1.1 0.3 - 1.2 mg/dL   Bilirubin, Direct 0.2 0.0 - 0.2 mg/dL   Indirect Bilirubin 0.9 0.3 - 0.9 mg/dL    Comment: Performed at [facility], [address] [place], [place] [address]  Prepare RBC (crossmatch)     Status: None   Collection Time: 07/25/22  9:04 AM  Result Value Ref Range   Order Confirmation      ORDER PROCESSED BY BLOOD BANK Performed at [facility], [address] [place], [place] [address]   Hemoglobin and hematocrit, blood     Status: Abnormal   Collection Time: 07/25/22  1:51 PM  Result Value Ref Range   Hemoglobin 9.4 (L) 13.0 - 17.0 g/dL    Comment: REPEATED TO VERIFY   HCT 27.6 (L) 39.0 - 52.0 %    Comment: Performed at [facility], [address] [place], [place] [address]  CBC  with Differential/Platelet     Status: Abnormal   Collection Time: 07/26/22  4:35 AM  Result Value Ref Range   WBC 1.5 (L) 4.0 - 10.5 K/uL   RBC 2.76 (L) 4.22 - 5.81 MIL/uL   Hemoglobin 9.2 (L) 13.0 - 17.0 g/dL   HCT 91.4 (L) 78.2 - 95.6 %   MCV 95.3 80.0 - 100.0 fL   MCH 33.3 26.0 - 34.0 pg   MCHC 35.0 30.0 - 36.0 g/dL   RDW 21.3 (H) 08.6 - 57.8 %   Platelets 55 (L) 150 - 400 K/uL    Comment: Immature Platelet Fraction may be clinically indicated, consider ordering this additional test [id number]    nRBC 1.4 (H) 0.0 - 0.2 %   Neutrophils Relative % 75 %   Neutro Abs 1.1 (L) 1.7 - 7.7 K/uL   Lymphocytes Relative 15 %   Lymphs Abs 0.2 (L) 0.7 - 4.0 K/uL   Monocytes Relative 7 %   Monocytes Absolute 0.1 0.1 - 1.0 K/uL   Eosinophils Relative 1 %   Eosinophils Absolute 0.0 0.0 - 0.5 K/uL   Basophils Relative 1 %   Basophils Absolute 0.0 0.0 - 0.1 K/uL   Immature Granulocytes 1 %   Abs Immature Granulocytes 0.01 0.00 - 0.07 K/uL    Comment: Performed at [facility], [address] [place], [place] [address]  CBC With Diff/Platelet     Status: Abnormal   Collection Time: 07/27/22  2:47 PM  Result Value Ref Range   WBC 2.4 (LL) 3.4 - 10.8 x10E3/uL   RBC 3.07 (L) 4.14 - 5.80 x10E6/uL   Hemoglobin 9.9 (L) 13.0 - 17.7 g/dL   Hematocrit 44.0 (L) 10.2 - 51.0 %   MCV 95 79 - 97 fL   MCH 32.2 26.6 - 33.0 pg   MCHC 34.0 31.5 - 35.7 g/dL   RDW 72.5 36.6 - 44.0 %   Platelets 62 (LL) 150 - 450 x10E3/uL    Comment: Actual platelet count may be somewhat higher than reported due to aggregation of platelets in this sample.    Neutrophils 73 Not Estab. %   Lymphs 12 Not Estab. %   Monocytes 11 Not Estab. %   Eos 3 Not Estab. %  Basos 0 Not Estab. %   Neutrophils Absolute 1.7 1.4 - 7.0 x10E3/uL   Lymphocytes Absolute 0.3 (L) 0.7 - 3.1 x10E3/uL   Monocytes Absolute 0.3 0.1 - 0.9 x10E3/uL   EOS (ABSOLUTE) 0.1 0.0 - 0.4 x10E3/uL   Basophils Absolute 0.0 0.0 - 0.2 x10E3/uL    Immature Granulocytes 1 Not Estab. %   Immature Grans (Abs) 0.0 0.0 - 0.1 x10E3/uL   NRBC 4 (H) 0 - 0 %   Hematology Comments: Note:     Comment: Verified by microscopic examination.  CMP (Cancer Center only)     Status: Abnormal   Collection Time: 08/02/22  7:59 AM  Result Value Ref Range   Sodium 137 135 - 145 mmol/L   Potassium 4.3 3.5 - 5.1 mmol/L   Chloride 104 98 - 111 mmol/L   CO2 26 22 - 32 mmol/L   Glucose, Bld 121 (H) 70 - 99 mg/dL    Comment: Glucose reference range applies only to samples taken after fasting for at least 8 hours.   BUN 21 8 - 23 mg/dL   Creatinine 6.29 (H) 5.28 - 1.24 mg/dL   Calcium 9.1 8.9 - 41.3 mg/dL   Total Protein 7.0 6.5 - 8.1 g/dL   Albumin 4.0 3.5 - 5.0 g/dL   AST 41 15 - 41 U/L   ALT 42 0 - 44 U/L   Alkaline Phosphatase 87 38 - 126 U/L   Total Bilirubin 0.6 0.3 - 1.2 mg/dL   GFR, Estimated >24 >40 mL/min    Comment: (NOTE) Calculated using the CKD-EPI Creatinine Equation (2021)    Anion gap 7 5 - 15    Comment: Performed at [facility], [address] [place], [place] [address]  CBC with Differential (Cancer Center Only)     Status: Abnormal   Collection Time: 08/02/22  7:59 AM  Result Value Ref Range   WBC Count 3.5 (L) 4.0 - 10.5 K/uL   RBC 3.28 (L) 4.22 - 5.81 MIL/uL   Hemoglobin 10.7 (L) 13.0 - 17.0 g/dL   HCT 53.6 (L) 64.4 - 03.4 %   MCV 97.9 80.0 - 100.0 fL   MCH 32.6 26.0 - 34.0 pg   MCHC 33.3 30.0 - 36.0 g/dL   RDW 74.2 (H) 59.5 - 63.8 %   Platelet Count 317 150 - 400 K/uL   nRBC 1.4 (H) 0.0 - 0.2 %   Neutrophils Relative % 71 %   Neutro Abs 2.5 1.7 - 7.7 K/uL   Lymphocytes Relative 8 %   Lymphs Abs 0.3 (L) 0.7 - 4.0 K/uL   Monocytes Relative 18 %   Monocytes Absolute 0.6 0.1 - 1.0 K/uL   Eosinophils Relative 1 %   Eosinophils Absolute 0.1 0.0 - 0.5 K/uL   Basophils Relative 1 %   Basophils Absolute 0.0 0.0 - 0.1 K/uL   Immature Granulocytes 1 %   Abs Immature Granulocytes 0.04 0.00 - 0.07 K/uL    Comment:  Performed at [facility], [address] [place], [place] [address]  Sample to Blood Bank     Status: None   Collection Time: 08/02/22  7:59 AM  Result Value Ref Range   Blood Bank Specimen SAMPLE AVAILABLE FOR TESTING    Sample Expiration      08/05/2022,2359 Performed at [facility], [address] [place], [place] [address]   Iron and TIBC     Status: None   Collection Time: 08/02/22  7:59 AM  Result Value  Ref Range   Iron 77 45 - 182 ug/dL   TIBC 161 096 - 045 ug/dL   Saturation Ratios 24 17.9 - 39.5 %   UIBC 251 ug/dL    Comment: Performed at [facility], [address] [place], [place] [address]  Ferritin     Status: None   Collection Time: 08/02/22  7:59 AM  Result Value Ref Range   Ferritin 277 24 - 336 ng/mL    Comment: Performed at [facility], [address] [place], [place] [address]       Assessment & Plan:   Problem List Items Addressed This Visit       Active Problems   Anemia - Primary (Chronic)   Relevant Orders   CBC With Diff/Platelet (Completed)    Return as scheduled.   Total time spent: 20 minutes  Miki Kins, FNP  07/27/2022

## 2022-08-02 NOTE — Assessment & Plan Note (Signed)
Chemotherapy plan as listed above 

## 2022-08-02 NOTE — Assessment & Plan Note (Signed)
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  S/p chemotherapy Xeloda 825mg /m2 BID+ concurrent RT Previously on adjuvant Xeloda [1000 mg/m2 BID for 14 of every 21 days] -dose reduced to 825mg /m2 BID due to skin rash, still not able to tolerate- discontinue.  switched to gemcitabine weekly, 2 weeks on, 1 week off, [adjustment due to cytopenia] Labs are reviewed and discussed with patient.  proceed with cycle 2 D1 gemcitabine -dose reduce to 900mg /m2 1 week, he will proceed with lab and C2D8 gemcitaibine.  Repeat lab in 2 weeks, close monitor hemoglobin level.

## 2022-08-02 NOTE — Patient Instructions (Signed)
[facility]  Discharge Instructions: Thank you for choosing [facility] to provide your oncology and hematology care.  If you have a lab appointment with the Cancer Center, please go directly to the Cancer Center and check in at the registration area.  Wear comfortable clothing and clothing appropriate for easy access to any Portacath or PICC line.   We strive to give you quality time with your provider. You may need to reschedule your appointment if you arrive late (15 or more minutes).  Arriving late affects you and other patients whose appointments are after yours.  Also, if you miss three or more appointments without notifying the office, you may be dismissed from the clinic at the provider's discretion.      For prescription refill requests, have your pharmacy contact our office and allow 72 hours for refills to be completed.    Today you received the following chemotherapy and/or immunotherapy agents Gemzar       To help prevent nausea and vomiting after your treatment, we encourage you to take your nausea medication as directed.  BELOW ARE SYMPTOMS THAT SHOULD BE REPORTED IMMEDIATELY: *FEVER GREATER THAN 100.4 F (38 C) OR HIGHER *CHILLS OR SWEATING *NAUSEA AND VOMITING THAT IS NOT CONTROLLED WITH YOUR NAUSEA MEDICATION *UNUSUAL SHORTNESS OF BREATH *UNUSUAL BRUISING OR BLEEDING *URINARY PROBLEMS (pain or burning when urinating, or frequent urination) *BOWEL PROBLEMS (unusual diarrhea, constipation, pain near the anus) TENDERNESS IN MOUTH AND THROAT WITH OR WITHOUT PRESENCE OF ULCERS (sore throat, sores in mouth, or a toothache) UNUSUAL RASH, SWELLING OR PAIN  UNUSUAL VAGINAL DISCHARGE OR ITCHING   Items with * indicate a potential emergency and should be followed up as soon as possible or go to the Emergency Department if any problems should occur.  Please show the CHEMOTHERAPY ALERT CARD or IMMUNOTHERAPY ALERT CARD at check-in to  the Emergency Department and triage nurse.  Should you have questions after your visit or need to cancel or reschedule your appointment, please contact [facility]  [phone] and follow the prompts.  Office hours are 8:00 a.m. to 4:30 p.m. Monday - Friday. Please note that voicemails left after 4:00 p.m. may not be returned until the following business day.  We are closed weekends and major holidays. You have access to a nurse at all times for urgent questions. Please call the main number to the clinic [phone] and follow the prompts.  For any non-urgent questions, you may also contact your provider using MyChart. We now offer e-Visits for anyone 18 and older to request care online for non-urgent symptoms. For details visit [email or web address].   Also download the MyChart app! Go to the app store, search "MyChart", open the app, select [facility], and log in with your MyChart username and password.    

## 2022-08-02 NOTE — Progress Notes (Signed)
Hematology/Oncology Progress note [phone] [phone]    CHIEF COMPLAINTS/REASON FOR VISIT:  Follow up for melanoma, cholangiocarcinoma.    ASSESSMENT & PLAN:   Cancer Staging  Cholangiocarcinoma [facility]) Staging form: Intrahepatic Bile Duct, AJCC 8th Edition - Pathologic stage from 01/25/2022: Stage Unknown (pT1a, pNX, cM0) - Signed by Rickard Patience, MD on 02/21/2022  Malignant melanoma of overlapping sites [facility]) Staging form: Melanoma of the Skin, AJCC 8th Edition - Pathologic: Stage Unknown (rpTX, pN1b, cM0) - Signed by Rickard Patience, MD on 07/27/2020 - Pathologic: No stage assigned - Unsigned   Cholangiocarcinoma (HCC) T1a Nx Cholangiocarcinoma Pathology was reviewed and discussed with patient.  S/p chemotherapy Xeloda 825mg /m2 BID+ concurrent RT Previously on adjuvant Xeloda [1000 mg/m2 BID for 14 of every 21 days] -dose reduced to 825mg /m2 BID due to skin rash, still not able to tolerate- discontinue.  switched to gemcitabine weekly, 2 weeks on, 1 week off, [adjustment due to cytopenia] Labs are reviewed and discussed with patient.  proceed with cycle 2 D1 gemcitabine -dose reduce to 900mg /m2 1 week, he will proceed with lab and C2D8 gemcitaibine.  Repeat lab in 2 weeks, close monitor hemoglobin level.     Malignant melanoma of overlapping sites [facility]) Patient has no radiographic evidence of metastatic melanoma on recent CT scan done at [facility] immunotherapy. 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  Hold ferrous sulfate 325mg  daily as it changes his stool color. Repeat iron level today.  If ferritin is less than 200, will schedule patient to get IV Venofer.  Trend hemoglobin weekly.   Stage 3a chronic kidney disease (HCC) Encourage oral hydration and avoid nephrotoxins.      Encounter for antineoplastic chemotherapy Chemotherapy plan as listed above.   Chemotherapy induced neutropenia  (HCC) Monitor ANC closely.  Neutropenia precaution.     All questions were answered. The patient knows to call the clinic with any problems, questions or concerns.  Rickard Patience, MD, PhD [facility] Hematology Oncology 08/02/2022    HISTORY OF PRESENTING ILLNESS:   Edward Dacruz is a  62 y.o.  male presents for recurrent malignant melanoma.   Oncology History  Malignant melanoma of overlapping sites [facility])  04/16/2019 Cancer Staging   Staging form: Melanoma of the Skin, AJCC 8th Edition - Pathologic: Stage Unknown (rpTX, pN1b, cM0) - Signed by Rickard Patience, MD on 07/27/2020 Stage prefix: Recurrence    04/23/2019 Initial Diagnosis   Malignant melanoma   -He has a history of left lower extremity melanoma in 2011, status post local excision -04/16/2019 patient underwent left groin mass resection  Resection pathology showed malignant melanoma, replacing a lymph node, with extracapsular extension, peripheral and deep margins involved.  Left inguinal contents, all 7 lymph nodes were negative for melanoma in the lymph nodes. Extranodal melanoma identified in lymphatic and interstitium between nodes -PDL1 80% TPS    07/07/2019 -  Radiation Therapy   status post adjuvant radiation.   07/23/2019 - 07/21/2021 Chemotherapy   Nivolumab q14d      06/29/2020 Imaging   CT chest abdomen pelvis showed stable postoperative appearance of the left groin.  No evidence of local recurrence.  No evidence of metastatic disease in the chest abdomen or pelvis.  Hepatic steatosis.  Stable subcentimeter fluid attenuation lesion of the lateral right lobe of the liver, likely benign cyst or hemangioma.  Coronary artery disease.  Aortic atherosclerosis   10/20/2020 Imaging   CT chest abdomen pelvis showed stable postoperative/radiation appearance  of the left groin.  No evidence of local recurrence/metastatic disease within the chest abdomen/pelvis.  Fatty liver disease.  Diverticulosis without evidence of typhlitis.  Aortic  atherosclerosis   03/10/2021 Imaging   MRI brain without contrast showed no definitive evidence of intracranial metastatic disease.  Study is limited by absence of intravenous contrast.     04/26/2021 Imaging   CT chest abdomen pelvis without contrast showed stable post operative changes of left groin with no evidence of recurrent disease.  No evidence of metastatic disease in the chest abdomen pelvis.  Aortic atherosclerosis   08/08/2021 - 08/16/2021 Hospital Admission    patient was hospitalized due to NSTEMI status post CABG x3.  He also had pacemaker The echocardiogram showed left ventricular ejection fraction of 65 to 70%,   09/15/2021 Imaging   CT chest abdomen pelvis w contrast  IMPRESSION: 1. Subtle hypodense 9 mm lesion in the left lobe of the liver is new from prior imaging including previous contrasted CT dating back to December 10, 2019, with the lesion appearing to equilibrate with background liver on delayed imaging sequence but is incompletely evaluated on this imaging study and technically nonspecific possibly reflecting a benign perfusional variant and while its appearance is not typical for that of a melanoma metastasis, it is not excluded on this examination. Suggest more definitive characterization by hepatic protocol MRI with and without contrast. 2. Stable postoperative changes in the left groin without evidence of local recurrent disease.3. No evidence of metastatic disease in the chest or pelvis.4.  Aortic Atherosclerosis (ICD10-I70.0).      09/23/2021 Imaging   Contrast-enhanced liver ultrasound Mildly hypoenhancing 2.2 cm mass in the posterior aspect of the left lobe of the liver with washout characteristics concerning for non hepatocellular malignancy, concerning for melanotic metastasis given history. The lesion is in an unfavorable location for percutaneous biopsy. Consider PET-CT for further characterization   10/21/2021 -  Chemotherapy   Nivolumab q14d       11/10/2021 Imaging   MRI Brain w wo contrast Negative for metastatic disease to the brain   11/10/2021 Imaging   MRI abdomen w wo contrast 1. Lesion of the posterior superior left lobe of the liver, hepatic segment II, measuring 1.9 x 1.8 cm corresponding to findings of prior imaging. Evaluation is somewhat limited breath motion artifact however there is subtle associated rim enhancement of this lesion. Findings are most in keeping with a hepatic metastasis in the setting of known recurrent melanoma. 2. Mild hepatic steatosis. 3. Cardiomegaly.    02/27/2022 - 02/27/2022 Chemotherapy   Patient is on Treatment Plan : Capecitabine (825 mg/m2 bid) + XRT     07/12/2022 -  Chemotherapy   Patient is on Treatment Plan : PANCREAS Gemcitabine D1,8(1000) q21d     Cholangiocarcinoma (HCC)  12/19/2021 Initial Diagnosis   Liver biopsy showed carcinoma  -The slides on the patient's prior left inguinal lymph node biopsy ([id number]) with metastatic melanoma were reviewed in conjunction with  this case. The morphology of the malignant cells between the two cases are dissimilar. A limited panel of immunohistochemical stains was performed and the neoplastic cells are positive for superpancytokeratin, cytokeratin 7 (diffuse, strong), cytokeratin 20 (patchy, moderate) and negative for S100, SOX-10, and HepPar-1.These findings are consistent with carcinoma. The morphologic findings and pattern of immunohistochemical staining are non-specific and possible sites of origin include but are not limited to pancreaticobiliary tract, GI tract, prostate, kidney, lung, and breast. Per [facility], the patient had a PET scan performed in July  2023 which demonstrated a single hepatic  hypermetabolic lesion without other sites of disease.    01/19/2022 Surgery   Liver lesion resection at [facility] by Dr.Zani  A.  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  Tumor extends  to hepatic parenchymal margin, but all other margins are negative for tumor. See synoptic report and comment.   Background liver with: Mild steatosis (20%).  No definitive evidence of steatohepatitis.   Mild periportal fibrosis (trichrome and reticulin). No stainable iron (Prussian blue stain).  No evidence to support alpha-1-antitrypsin deficiency on PAS-D stain.   pT1a pNx   01/25/2022 Cancer Staging   Staging form: Intrahepatic Bile Duct, AJCC 8th Edition - Pathologic stage from 01/25/2022: Stage Unknown (pT1a, pNX, cM0) - Signed by Rickard Patience, MD on 02/21/2022 Stage prefix: Initial diagnosis   03/23/2022 - 05/01/2022 Radiation Therapy   Concurrent Xeloda 825mg /m2 BID + Radiation.    05/08/2022 -  Chemotherapy   Xeloda [1000 mg/m2 BID for 14 of every 21 days], plan 4 months.   05/08/2022, cycle 1 Xeloda.  Xeloda was stopped on 05/19/22 due to developing rash on face as well as dorsum of hands. Treated with steroid,symptoms resolved.  3/4-3/13/2024  Xeloda was stopped early due to groin cellulitis, he also developed similar skin rash again  Plan 3/25 cycle 3 dose reduce Xeloda 825 mg/m2   2000mg  BID x 14 days.   05/09/2022 Imaging   CT chest abdomen pelvis with contrast at [facility] showed Status post partial left hepatectomy without CT evidence of recurrent or metastatic disease in the chest, abdomen, pelvis.      04/18/2021, patient establish care with neurology Dr. Barbaraann Cao for intermittent altered cognition.  he was recommended to start Vimpat.    INTERVAL HISTORY Terri Kodama is a 62 y.o. male who has above history reviewed by me today presents for follow up visit for management of inguinal nodal recurrence of melanoma, resected T1a cholangiocarcinoma. Recent hospitalization due to symptomatic anemia, status post 1 unit of PRBC transfusion.  He also had chemotherapy induced neutropenia and thrombocytopenia. At discharge, hemoglobin was 9.9.  Today He reports feeling well.  Chronic fatigue  unchanged.  No fever or chills.     :Review of Systems  Constitutional:  Positive for fatigue. Negative for appetite change, chills, fever and unexpected weight change.  HENT:   Negative for hearing loss and voice change.   Eyes:  Negative for eye problems and icterus.  Respiratory:  Negative for chest tightness, cough and shortness of breath.   Cardiovascular:  Positive for leg swelling. Negative for chest pain.  Gastrointestinal:  Negative for abdominal distention and abdominal pain.  Endocrine: Negative for hot flashes.  Genitourinary:  Negative for difficulty urinating, dysuria and frequency.   Musculoskeletal:        Status post left hip replacement  Skin:  Negative for itching.       Skin hypo-pigmentation on upper extremities, no change   Neurological:  Negative for light-headedness and numbness.       Spells  Hematological:  Negative for adenopathy. Does not bruise/bleed easily.  Psychiatric/Behavioral:  Negative for confusion.     MEDICAL HISTORY:  Past Medical History:  Diagnosis Date   Anemia    iron treatments   Anxiety    Aortic atherosclerosis (HCC)    Arthritis    Cancer of groin (HCC) 2021   left groin, resected, radiation   Cataract    Complication of anesthesia  PONV   Coronary artery disease    Dizziness of unknown etiology    has led to seizures and passing out.   Family history of adverse reaction to anesthesia    PONV mother   GERD (gastroesophageal reflux disease)    History of complete heart block    PPM placed   Hyperlipidemia    Hypertension    LBBB (left bundle branch block)    Lymphedema of left leg    uses thigh high compression stockings   Melanoma (HCC) 2012   skin cancer, left thigh   OSA on CPAP    PONV (postoperative nausea and vomiting) 04/16/2019   Port-A-Cath in place    RIGHT chest wall   Presence of cardiac pacemaker    [employer]   Seizures (HCC)    still has episodes of dizziness. last event 1 month ago (march 2022)  and will pass out. takes clonazepam    SURGICAL HISTORY: Past Surgical History:  Procedure Laterality Date   CORONARY ARTERY BYPASS GRAFT N/A 08/10/2021   Procedure: CORONARY ARTERY BYPASS GRAFTING (CABG) X 3 USING LEFT INTERNAL MAMMARY ARTERY AND RIGHT GREATER SAPHENOUS VEIN;  Surgeon: Lovett Sox, MD;  Location: [facility] OR;  Service: Open Heart Surgery;  Laterality: N/A;   CT RADIATION THERAPY GUIDE     left groin   dental implant     permanent implant   ENDOVEIN HARVEST OF GREATER SAPHENOUS VEIN Right 08/10/2021   Procedure: ENDOVEIN HARVEST OF GREATER SAPHENOUS VEIN;  Surgeon: Lovett Sox, MD;  Location: [facility] OR;  Service: Open Heart Surgery;  Laterality: Right;   KNEE SURGERY Left    arthroscopy   LEFT HEART CATH AND CORONARY ANGIOGRAPHY Left 06/29/2017   Procedure: LEFT HEART CATH AND CORONARY ANGIOGRAPHY;  Surgeon: Lamar Blinks, MD;  Location: [facility] INVASIVE CV LAB;  Service: Cardiovascular;  Laterality: Left;   LEFT HEART CATH AND CORONARY ANGIOGRAPHY N/A 08/08/2021   Procedure: LEFT HEART CATH AND CORONARY ANGIOGRAPHY;  Surgeon: Lamar Blinks, MD;  Location: [facility] INVASIVE CV LAB;  Service: Cardiovascular;  Laterality: N/A;   LYMPH NODE DISSECTION Left 04/16/2019   Procedure: Left inguinal Lymph Node Dissection;  Surgeon: Almond Lint, MD;  Location: [facility] OR;  Service: General;  Laterality: Left;   MELANOMA EXCISION Left 04/16/2019   Procedure: MELANOMA EXCISION LEFT GROIN MASS;  Surgeon: Almond Lint, MD;  Location: [facility] OR;  Service: General;  Laterality: Left;   MELANOMA EXCISION WITH SENTINEL LYMPH NODE BIOPSY Left 2012   Left calf    PACEMAKER INSERTION N/A 08/26/2018   Procedure: INSERTION PACEMAKER;  Surgeon: Marcina Millard, MD;  Location: [facility] ORS;  Service: Cardiovascular;  Laterality: N/A;   PORTA CATH INSERTION N/A 08/26/2019   Procedure: PORTA CATH INSERTION;  Surgeon: Renford Dills, MD;  Location: [facility] INVASIVE CV LAB;  Service: Cardiovascular;  Laterality:  N/A;   SUPERFICIAL LYMPH NODE BIOPSY / EXCISION Left 2020   lymph nodes removed around left groin melanoma site   TEE WITHOUT CARDIOVERSION N/A 08/10/2021   Procedure: TRANSESOPHAGEAL ECHOCARDIOGRAM (TEE);  Surgeon: Lovett Sox, MD;  Location: [facility] OR;  Service: Open Heart Surgery;  Laterality: N/A;   TEMPORARY PACEMAKER N/A 08/25/2018   Procedure: TEMPORARY PACEMAKER;  Surgeon: Tonny Bollman, MD;  Location: [facility] INVASIVE CV LAB;  Service: Cardiovascular;  Laterality: N/A;   TOTAL HIP ARTHROPLASTY Left 07/14/2020   Procedure: TOTAL HIP ARTHROPLASTY;  Surgeon: Donato Heinz, MD;  Location: [facility] ORS;  Service: Orthopedics;  Laterality: Left;    SOCIAL  HISTORY: Social History   Socioeconomic History   Marital status: Married    Spouse name: [name]    Number of children: 7   Years of education: 12   Highest education level: Not on file  Occupational History    Comment: disability  Tobacco Use   Smoking status: Never   Smokeless tobacco: Never  Vaping Use   Vaping Use: Never used  Substance and Sexual Activity   Alcohol use: No   Drug use: No   Sexual activity: Not Currently  Other Topics Concern   Not on file  Social History Narrative   Lives with  Wife,   Has 2 small dogs   Caffeine use: sodas (2 per day)      Out of work on disability.  Has a walk in shower. No stairs to climb   Oncology treatment ongoing. Uses port a cath for treatment.      pacemaker   Social Determinants of Health   Financial Resource Strain: Low Risk  (02/12/2019)   Overall Financial Resource Strain (CARDIA)    Difficulty of Paying Living Expenses: Not hard at all  Food Insecurity: Food Insecurity Present (02/21/2022)   Hunger Vital Sign    Worried About Running Out of Food in the Last Year: Sometimes true    Ran Out of Food in the Last Year: Often true  Transportation Needs: No Transportation Needs (07/25/2022)   PRAPARE - Administrator, Civil Service (Medical): No    Lack of  Transportation (Non-Medical): No  Physical Activity: Unknown (02/12/2019)   Exercise Vital Sign    Days of Exercise per Week: 0 days    Minutes of Exercise per Session: Not on file  Stress: No Stress Concern Present (02/12/2019)   [employer] of Occupational Health - Occupational Stress Questionnaire    Feeling of Stress : Only a little  Social Connections: Unknown (02/12/2019)   Social Connection and Isolation Panel [NHANES]    Frequency of Communication with Friends and Family: More than three times a week    Frequency of Social Gatherings with Friends and Family: Not on file    Attends Religious Services: Not on file    Active Member of Clubs or Organizations: Not on file    Attends Banker Meetings: Not on file    Marital Status: Married  Intimate Partner Violence: Not At Risk (07/25/2022)   Humiliation, Afraid, Rape, and Kick questionnaire    Fear of Current or Ex-Partner: No    Emotionally Abused: No    Physically Abused: No    Sexually Abused: No    FAMILY HISTORY: Family History  Problem Relation Age of Onset   Cancer Paternal Grandmother     ALLERGIES:  is allergic to ibuprofen, levetiracetam, nsaids, and capecitabine.  MEDICATIONS:  Current Outpatient Medications  Medication Sig Dispense Refill   Carboxymeth-Glyc-Polysorb PF (REFRESH OPTIVE MEGA-3) 0.5-1-0.5 % SOLN Place 1 drop into both eyes daily as needed (dry eyes).     clonazePAM (KLONOPIN) 0.5 MG tablet TAKE 1 TABLET BY MOUTH IN THE MORNING AND 1 BY MOUTH IN THE EVENING per pt 120 tablet 1   clotrimazole (CLOTRIMAZOLE ANTI-FUNGAL) 1 % cream Apply 1 Application topically 2 (two) times daily. Apply to the L inguinal fold 30 g 0   ferrous sulfate 325 (65 FE) MG EC tablet Take 1 tablet (325 mg total) by mouth daily. 180 tablet 1   hydrocortisone 2.5 % ointment Apply 1 application. topically daily as needed (itching).  Lacosamide 100 MG TABS Take 1 tablet (100 mg total) by mouth in the morning  and at bedtime. 60 tablet 3   lidocaine-prilocaine (EMLA) cream Apply 1 application. topically as needed. Apply small amount of cream to port site approx 1-2 hours prior to appointment. 30 g 11   metoprolol succinate (TOPROL-XL) 25 MG 24 hr tablet Take 25 mg by mouth daily.     Multiple Vitamin (MULTIVITAMIN WITH MINERALS) TABS tablet Take 1 tablet by mouth daily. Centrum Silver     nystatin (MYCOSTATIN/NYSTOP) powder Apply 1 Application topically 3 (three) times daily. 60 g 1   omeprazole (PRILOSEC) 20 MG capsule TAKE 1 CAPSULE BY MOUTH EVERY DAY 90 capsule 0   ondansetron (ZOFRAN) 4 MG tablet TAKE 1 TABLET BY MOUTH EVERY 8 HOURS AS NEEDED FOR NAUSEA AND VOMITING 90 tablet 1   apixaban (ELIQUIS) 5 MG TABS tablet Take 5 mg by mouth 2 (two) times daily.     aspirin 81 MG EC tablet Take 81 mg by mouth daily.     atorvastatin (LIPITOR) 20 MG tablet Take 20 mg by mouth daily.     No current facility-administered medications for this visit.   Facility-Administered Medications Ordered in Other Visits  Medication Dose Route Frequency Provider Last Rate Last Admin   gemcitabine (GEMZAR) 2,090 mg in sodium chloride 0.9 % 250 mL chemo infusion  900 mg/m2 (Treatment Plan Recorded) Intravenous Once Rickard Patience, MD       heparin lock flush 100 UNIT/ML injection            heparin lock flush 100 UNIT/ML injection            heparin lock flush 100 unit/mL  500 Units Intracatheter Once PRN Rickard Patience, MD         PHYSICAL EXAMINATION: ECOG PERFORMANCE STATUS: 1 - Symptomatic but completely ambulatory Vitals:   08/02/22 0832  BP: 125/68  Pulse: 86  Resp: 18  Temp: (!) 96.2 F (35.7 C)  SpO2: 96%   Filed Weights   08/02/22 0832  Weight: 252 lb 8 oz (114.5 kg)    Physical Exam Constitutional:      General: He is not in acute distress.    Comments: Patient ambulates independently  HENT:     Head: Normocephalic and atraumatic.  Eyes:     General: No scleral icterus.    Pupils: Pupils are equal,  round, and reactive to light.  Cardiovascular:     Rate and Rhythm: Normal rate and regular rhythm.     Heart sounds: Normal heart sounds.  Pulmonary:     Effort: Pulmonary effort is normal. No respiratory distress.     Breath sounds: No wheezing.  Abdominal:     General: Bowel sounds are normal. There is no distension.     Palpations: Abdomen is soft. There is no mass.     Tenderness: There is no abdominal tenderness.     Comments:    Musculoskeletal:        General: No deformity. Normal range of motion.     Cervical back: Normal range of motion and neck supple.     Comments: Left lower extremity edema-chronic   Skin:    General: Skin is warm and dry.     Comments: Bilateral dorsum had raised erythematous rash-improved Left groin chronic edema with erythematous changes.-Improved with residual erythema  Neurological:     Mental Status: He is alert and oriented to person, place, and time. Mental status is at baseline.  Cranial Nerves: No cranial nerve deficit.     Coordination: Coordination normal.  Psychiatric:        Mood and Affect: Mood normal.     LABORATORY DATA:  I have reviewed the data as listed    Latest Ref Rng & Units 08/02/2022    7:59 AM 07/27/2022    2:47 PM 07/26/2022    4:35 AM  CBC  WBC 4.0 - 10.5 K/uL 3.5  2.4  1.5   Hemoglobin 13.0 - 17.0 g/dL 29.5  9.9  9.2   Hematocrit 39.0 - 52.0 % 32.1  29.1  26.3   Platelets 150 - 400 K/uL 317  62  55       Latest Ref Rng & Units 08/02/2022    7:59 AM 07/25/2022    4:45 AM 07/24/2022   10:40 AM  CMP  Glucose 70 - 99 mg/dL 621  308  657   BUN 8 - 23 mg/dL 21  26  36   Creatinine 0.61 - 1.24 mg/dL 8.46  9.62  9.52   Sodium 135 - 145 mmol/L 137  139  139   Potassium 3.5 - 5.1 mmol/L 4.3  5.0  5.4   Chloride 98 - 111 mmol/L 104  110  111   CO2 22 - 32 mmol/L 26  23  23    Calcium 8.9 - 10.3 mg/dL 9.1  8.8  9.0   Total Protein 6.5 - 8.1 g/dL 7.0  6.1    Total Bilirubin 0.3 - 1.2 mg/dL 0.6  1.1    Alkaline Phos 38  - 126 U/L 87  69    AST 15 - 41 U/L 41  32    ALT 0 - 44 U/L 42  46       RADIOGRAPHIC STUDIES: I have personally reviewed the radiological images as listed and agreed with the findings in the report. DG Chest Port 1 View  Result Date: 07/24/2022 CLINICAL DATA:  Dyspnea on exertion EXAM: PORTABLE CHEST 1 VIEW COMPARISON:  Chest x-ray 02/21/2022 FINDINGS: Right chest port catheter tip projects over the SVC. Left-sided pacemaker again noted. Sternotomy wires and mediastinal clips are present. The cardiomediastinal silhouette is within normal limits. The lungs are clear. There is no pleural effusion or pneumothorax. No acute fractures are seen. IMPRESSION: No active disease. Electronically Signed   By: Darliss Cheney M.D.   On: 07/24/2022 17:47   PCV ECHOCARDIOGRAM COMPLETE  Result Date: 05/16/2022 Images from the original result were not included. Reason for Visit  INDICATIONS:   The patient is Z00.00. Echocardiogram: An echocardiogram in (2-d) mode was performed and in Doppler mode with color flow velocity mapping was performed. ventricular septum thickness 1.23 cm, L ventricular posterior wall thickness (diastole) 1.22 cm, left atrium size 4.2 cm, aortic root diameter 3.6 cm, L ventricle diastolic dimension 4.38 cm, L ventricle systolic dimension 3.12, L ventricle ejection fraction 55.6 %, and LV fractional shortening 28.8 % L ventricular outflow tract internal diameter 3.2 cm, L ventricular outflow tract flow velocity 1.38 m/s, aortic valve cusps 1.7 cm , aortic valve flow velocity 1.71 (m/sec), aortic valve systolic calculated mean flow gradient 8 mmHg, mitral valve diastolic peak flow velocity E 8.41 m/sec, and mitral valve diastolic peak flow E/A ratio 3.2 % Mitral valve has trace regurgitation Tricuspid valve has trace regurgitation ASSESSMENT Technically adequate study. Mild left ventricular hypertrophy with GRADE 2 (psuedonormalization)  diastolic dysfunction. Normal right ventricular systolic  function. Normal right ventricular diastolic function. Normal left ventricular wall  motion. Normal right ventricular wall motion. Trace tricuspid regurgitation. Mild pulmonary hypertension. Trace mitral regurgitation. No pericardial effusion. Mildly dilated Left atrium Mild LVH

## 2022-08-02 NOTE — Assessment & Plan Note (Addendum)
Encourage oral hydration and avoid nephrotoxins.   

## 2022-08-02 NOTE — Assessment & Plan Note (Signed)
Patient has no radiographic evidence of metastatic melanoma on recent CT scan done at [facility] immunotherapy. Continue surveillance 

## 2022-08-03 ENCOUNTER — Encounter: Payer: Self-pay | Admitting: Oncology

## 2022-08-03 ENCOUNTER — Other Ambulatory Visit (HOSPITAL_COMMUNITY): Payer: Self-pay

## 2022-08-08 MED FILL — Iron Sucrose Inj 20 MG/ML (Fe Equiv): INTRAVENOUS | Qty: 10 | Status: AC

## 2022-08-09 ENCOUNTER — Inpatient Hospital Stay: Payer: 59

## 2022-08-09 ENCOUNTER — Ambulatory Visit: Payer: 59 | Admitting: Oncology

## 2022-08-09 ENCOUNTER — Ambulatory Visit: Payer: 59

## 2022-08-09 VITALS — BP 149/58 | HR 70 | Temp 96.0°F | Resp 18 | Wt 252.2 lb

## 2022-08-09 DIAGNOSIS — D701 Agranulocytosis secondary to cancer chemotherapy: Secondary | ICD-10-CM | POA: Diagnosis not present

## 2022-08-09 DIAGNOSIS — D631 Anemia in chronic kidney disease: Secondary | ICD-10-CM | POA: Diagnosis not present

## 2022-08-09 DIAGNOSIS — Z85820 Personal history of malignant melanoma of skin: Secondary | ICD-10-CM | POA: Diagnosis not present

## 2022-08-09 DIAGNOSIS — C438 Malignant melanoma of overlapping sites of skin: Secondary | ICD-10-CM

## 2022-08-09 DIAGNOSIS — Z5111 Encounter for antineoplastic chemotherapy: Secondary | ICD-10-CM | POA: Diagnosis not present

## 2022-08-09 DIAGNOSIS — C221 Intrahepatic bile duct carcinoma: Secondary | ICD-10-CM | POA: Diagnosis not present

## 2022-08-09 DIAGNOSIS — N1831 Chronic kidney disease, stage 3a: Secondary | ICD-10-CM | POA: Diagnosis not present

## 2022-08-09 DIAGNOSIS — I272 Pulmonary hypertension, unspecified: Secondary | ICD-10-CM | POA: Diagnosis not present

## 2022-08-09 LAB — CBC WITH DIFFERENTIAL (CANCER CENTER ONLY)
Abs Immature Granulocytes: 0.16 10*3/uL — ABNORMAL HIGH (ref 0.00–0.07)
Basophils Absolute: 0 10*3/uL (ref 0.0–0.1)
Basophils Relative: 1 %
Eosinophils Absolute: 0 10*3/uL (ref 0.0–0.5)
Eosinophils Relative: 1 %
HCT: 28.2 % — ABNORMAL LOW (ref 39.0–52.0)
Hemoglobin: 9.4 g/dL — ABNORMAL LOW (ref 13.0–17.0)
Immature Granulocytes: 7 %
Lymphocytes Relative: 15 %
Lymphs Abs: 0.3 10*3/uL — ABNORMAL LOW (ref 0.7–4.0)
MCH: 33 pg (ref 26.0–34.0)
MCHC: 33.3 g/dL (ref 30.0–36.0)
MCV: 98.9 fL (ref 80.0–100.0)
Monocytes Absolute: 0.6 10*3/uL (ref 0.1–1.0)
Monocytes Relative: 25 %
Neutro Abs: 1.1 10*3/uL — ABNORMAL LOW (ref 1.7–7.7)
Neutrophils Relative %: 51 %
Platelet Count: 277 10*3/uL (ref 150–400)
RBC: 2.85 MIL/uL — ABNORMAL LOW (ref 4.22–5.81)
RDW: 16 % — ABNORMAL HIGH (ref 11.5–15.5)
Smear Review: NORMAL
WBC Count: 2.2 10*3/uL — ABNORMAL LOW (ref 4.0–10.5)
nRBC: 4.9 % — ABNORMAL HIGH (ref 0.0–0.2)

## 2022-08-09 LAB — CMP (CANCER CENTER ONLY)
ALT: 58 U/L — ABNORMAL HIGH (ref 0–44)
AST: 53 U/L — ABNORMAL HIGH (ref 15–41)
Albumin: 3.7 g/dL (ref 3.5–5.0)
Alkaline Phosphatase: 78 U/L (ref 38–126)
Anion gap: 9 (ref 5–15)
BUN: 18 mg/dL (ref 8–23)
CO2: 23 mmol/L (ref 22–32)
Calcium: 8.8 mg/dL — ABNORMAL LOW (ref 8.9–10.3)
Chloride: 106 mmol/L (ref 98–111)
Creatinine: 1.27 mg/dL — ABNORMAL HIGH (ref 0.61–1.24)
GFR, Estimated: >60 mL/min (ref >60)
Glucose, Bld: 199 mg/dL — ABNORMAL HIGH (ref 70–99)
Potassium: 3.9 mmol/L (ref 3.5–5.1)
Sodium: 138 mmol/L (ref 135–145)
Total Bilirubin: 0.5 mg/dL (ref 0.3–1.2)
Total Protein: 6.6 g/dL (ref 6.5–8.1)

## 2022-08-09 MED ORDER — SODIUM CHLORIDE 0.9 % IV SOLN
Freq: Once | INTRAVENOUS | Status: AC
  Filled 2022-08-09: qty 250

## 2022-08-09 MED ORDER — SODIUM CHLORIDE 0.9 % IV SOLN
900.0000 mg/m2 | Freq: Once | INTRAVENOUS | Status: AC
  Administered 2022-08-09: 2090 mg via INTRAVENOUS
  Filled 2022-08-09: qty 2.36

## 2022-08-09 MED ORDER — HEPARIN SOD (PORK) LOCK FLUSH 100 UNIT/ML IV SOLN
500.0000 [IU] | Freq: Once | INTRAVENOUS | Status: AC | PRN
  Filled 2022-08-09: qty 5

## 2022-08-09 MED ORDER — HEPARIN SOD (PORK) LOCK FLUSH 100 UNIT/ML IV SOLN
INTRAVENOUS | Status: AC
  Administered 2022-08-09: 500 [IU]
  Filled 2022-08-09: qty 5

## 2022-08-09 MED ORDER — PROCHLORPERAZINE MALEATE 10 MG PO TABS
10.0000 mg | ORAL_TABLET | Freq: Once | ORAL | Status: AC
  Administered 2022-08-09: 10 mg via ORAL
  Filled 2022-08-09: qty 1

## 2022-08-09 NOTE — Patient Instructions (Signed)
[facility] [place] [facility]  Discharge Instructions: Thank you for choosing [facility] to provide your oncology and hematology care.  If you have a lab appointment with the Cancer Center, please go directly to the Cancer Center and check in at the registration area.  Wear comfortable clothing and clothing appropriate for easy access to any Portacath or PICC line.   We strive to give you quality time with your provider. You may need to reschedule your appointment if you arrive late (15 or more minutes).  Arriving late affects you and other patients whose appointments are after yours.  Also, if you miss three or more appointments without notifying the office, you may be dismissed from the clinic at the provider's discretion.      For prescription refill requests, have your pharmacy contact our office and allow 72 hours for refills to be completed.    Today you received the following chemotherapy and/or immunotherapy agents Gemzar       To help prevent nausea and vomiting after your treatment, we encourage you to take your nausea medication as directed.  BELOW ARE SYMPTOMS THAT SHOULD BE REPORTED IMMEDIATELY: *FEVER GREATER THAN 100.4 F (38 C) OR HIGHER *CHILLS OR SWEATING *NAUSEA AND VOMITING THAT IS NOT CONTROLLED WITH YOUR NAUSEA MEDICATION *UNUSUAL SHORTNESS OF BREATH *UNUSUAL BRUISING OR BLEEDING *URINARY PROBLEMS (pain or burning when urinating, or frequent urination) *BOWEL PROBLEMS (unusual diarrhea, constipation, pain near the anus) TENDERNESS IN MOUTH AND THROAT WITH OR WITHOUT PRESENCE OF ULCERS (sore throat, sores in mouth, or a toothache) UNUSUAL RASH, SWELLING OR PAIN  UNUSUAL VAGINAL DISCHARGE OR ITCHING   Items with * indicate a potential emergency and should be followed up as soon as possible or go to the Emergency Department if any problems should occur.  Please show the CHEMOTHERAPY ALERT CARD or IMMUNOTHERAPY ALERT CARD at check-in to  the Emergency Department and triage nurse.  Should you have questions after your visit or need to cancel or reschedule your appointment, please contact [facility] [address] [facility]  [phone] and follow the prompts.  Office hours are 8:00 a.m. to 4:30 p.m. Monday - Friday. Please note that voicemails left after 4:00 p.m. may not be returned until the following business day.  We are closed weekends and major holidays. You have access to a nurse at all times for urgent questions. Please call the main number to the clinic [phone] and follow the prompts.  For any non-urgent questions, you may also contact your provider using MyChart. We now offer e-Visits for anyone 18 and older to request care online for non-urgent symptoms. For details visit [email or web address].   Also download the MyChart app! Go to the app store, search "MyChart", open the app, select [facility], and log in with your MyChart username and password.    

## 2022-08-09 NOTE — Patient Instructions (Signed)

## 2022-08-15 ENCOUNTER — Inpatient Hospital Stay: Payer: 59

## 2022-08-15 DIAGNOSIS — C438 Malignant melanoma of overlapping sites of skin: Secondary | ICD-10-CM

## 2022-08-15 DIAGNOSIS — N1831 Chronic kidney disease, stage 3a: Secondary | ICD-10-CM | POA: Diagnosis not present

## 2022-08-15 DIAGNOSIS — Z5111 Encounter for antineoplastic chemotherapy: Secondary | ICD-10-CM | POA: Diagnosis not present

## 2022-08-15 DIAGNOSIS — I272 Pulmonary hypertension, unspecified: Secondary | ICD-10-CM | POA: Diagnosis not present

## 2022-08-15 DIAGNOSIS — Z85820 Personal history of malignant melanoma of skin: Secondary | ICD-10-CM | POA: Diagnosis not present

## 2022-08-15 DIAGNOSIS — D701 Agranulocytosis secondary to cancer chemotherapy: Secondary | ICD-10-CM | POA: Diagnosis not present

## 2022-08-15 DIAGNOSIS — C221 Intrahepatic bile duct carcinoma: Secondary | ICD-10-CM | POA: Diagnosis not present

## 2022-08-15 DIAGNOSIS — D631 Anemia in chronic kidney disease: Secondary | ICD-10-CM | POA: Diagnosis not present

## 2022-08-15 LAB — CBC WITH DIFFERENTIAL (CANCER CENTER ONLY)
Abs Immature Granulocytes: 0.07 10*3/uL (ref 0.00–0.07)
Basophils Absolute: 0 10*3/uL (ref 0.0–0.1)
Basophils Relative: 1 %
Eosinophils Absolute: 0 10*3/uL (ref 0.0–0.5)
Eosinophils Relative: 1 %
HCT: 25.8 % — ABNORMAL LOW (ref 39.0–52.0)
Hemoglobin: 8.8 g/dL — ABNORMAL LOW (ref 13.0–17.0)
Immature Granulocytes: 4 %
Lymphocytes Relative: 16 %
Lymphs Abs: 0.3 10*3/uL — ABNORMAL LOW (ref 0.7–4.0)
MCH: 33.1 pg (ref 26.0–34.0)
MCHC: 34.1 g/dL (ref 30.0–36.0)
MCV: 97 fL (ref 80.0–100.0)
Monocytes Absolute: 0.3 10*3/uL (ref 0.1–1.0)
Monocytes Relative: 18 %
Neutro Abs: 1 10*3/uL — ABNORMAL LOW (ref 1.7–7.7)
Neutrophils Relative %: 60 %
Platelet Count: 100 10*3/uL — ABNORMAL LOW (ref 150–400)
RBC: 2.66 MIL/uL — ABNORMAL LOW (ref 4.22–5.81)
RDW: 15.6 % — ABNORMAL HIGH (ref 11.5–15.5)
WBC Count: 1.6 10*3/uL — ABNORMAL LOW (ref 4.0–10.5)
nRBC: 1.2 % — ABNORMAL HIGH (ref 0.0–0.2)

## 2022-08-15 LAB — SAMPLE TO BLOOD BANK

## 2022-08-16 ENCOUNTER — Other Ambulatory Visit: Payer: Self-pay | Admitting: Oncology

## 2022-08-16 ENCOUNTER — Inpatient Hospital Stay: Payer: 59

## 2022-08-16 DIAGNOSIS — D649 Anemia, unspecified: Secondary | ICD-10-CM

## 2022-08-16 LAB — PREPARE RBC (CROSSMATCH)

## 2022-08-16 LAB — TYPE AND SCREEN: ABO/RH(D): A POS

## 2022-08-16 LAB — BPAM RBC: Unit Type and Rh: 6200

## 2022-08-17 ENCOUNTER — Inpatient Hospital Stay: Payer: 59

## 2022-08-17 DIAGNOSIS — Z85820 Personal history of malignant melanoma of skin: Secondary | ICD-10-CM | POA: Diagnosis not present

## 2022-08-17 DIAGNOSIS — I272 Pulmonary hypertension, unspecified: Secondary | ICD-10-CM | POA: Diagnosis not present

## 2022-08-17 DIAGNOSIS — Z5111 Encounter for antineoplastic chemotherapy: Secondary | ICD-10-CM | POA: Diagnosis not present

## 2022-08-17 DIAGNOSIS — D701 Agranulocytosis secondary to cancer chemotherapy: Secondary | ICD-10-CM | POA: Diagnosis not present

## 2022-08-17 DIAGNOSIS — D649 Anemia, unspecified: Secondary | ICD-10-CM

## 2022-08-17 DIAGNOSIS — C221 Intrahepatic bile duct carcinoma: Secondary | ICD-10-CM | POA: Diagnosis not present

## 2022-08-17 DIAGNOSIS — N1831 Chronic kidney disease, stage 3a: Secondary | ICD-10-CM | POA: Diagnosis not present

## 2022-08-17 DIAGNOSIS — D631 Anemia in chronic kidney disease: Secondary | ICD-10-CM | POA: Diagnosis not present

## 2022-08-17 LAB — TYPE AND SCREEN
Antibody Screen: NEGATIVE
Unit division: 0

## 2022-08-17 MED ORDER — HEPARIN SOD (PORK) LOCK FLUSH 100 UNIT/ML IV SOLN
500.0000 [IU] | Freq: Every day | INTRAVENOUS | Status: AC | PRN
  Administered 2022-08-17: 500 [IU]
  Filled 2022-08-17: qty 5

## 2022-08-17 MED ORDER — DIPHENHYDRAMINE HCL 25 MG PO CAPS
25.0000 mg | ORAL_CAPSULE | Freq: Once | ORAL | Status: AC
  Administered 2022-08-17: 25 mg via ORAL
  Filled 2022-08-17: qty 1

## 2022-08-17 MED ORDER — ACETAMINOPHEN 325 MG PO TABS
650.0000 mg | ORAL_TABLET | Freq: Once | ORAL | Status: AC
  Administered 2022-08-17: 650 mg via ORAL
  Filled 2022-08-17: qty 2

## 2022-08-17 MED ORDER — SODIUM CHLORIDE 0.9% IV SOLUTION
250.0000 mL | Freq: Once | INTRAVENOUS | Status: AC
  Administered 2022-08-17: 250 mL via INTRAVENOUS
  Filled 2022-08-17: qty 250

## 2022-08-18 LAB — TYPE AND SCREEN

## 2022-08-18 LAB — BPAM RBC
Blood Product Expiration Date: 202406192359
Unit Type and Rh: 6200

## 2022-08-22 ENCOUNTER — Other Ambulatory Visit: Payer: Self-pay

## 2022-08-22 DIAGNOSIS — D649 Anemia, unspecified: Secondary | ICD-10-CM

## 2022-08-23 ENCOUNTER — Inpatient Hospital Stay: Payer: 59

## 2022-08-23 ENCOUNTER — Inpatient Hospital Stay (HOSPITAL_BASED_OUTPATIENT_CLINIC_OR_DEPARTMENT_OTHER): Payer: 59 | Admitting: Oncology

## 2022-08-23 ENCOUNTER — Encounter: Payer: Self-pay | Admitting: Oncology

## 2022-08-23 VITALS — BP 141/69 | HR 74 | Temp 97.5°F | Resp 18 | Wt 249.8 lb

## 2022-08-23 DIAGNOSIS — C438 Malignant melanoma of overlapping sites of skin: Secondary | ICD-10-CM

## 2022-08-23 DIAGNOSIS — Z85820 Personal history of malignant melanoma of skin: Secondary | ICD-10-CM | POA: Diagnosis not present

## 2022-08-23 DIAGNOSIS — C221 Intrahepatic bile duct carcinoma: Secondary | ICD-10-CM | POA: Diagnosis not present

## 2022-08-23 DIAGNOSIS — N1831 Chronic kidney disease, stage 3a: Secondary | ICD-10-CM | POA: Diagnosis not present

## 2022-08-23 DIAGNOSIS — N1832 Chronic kidney disease, stage 3b: Secondary | ICD-10-CM | POA: Diagnosis not present

## 2022-08-23 DIAGNOSIS — D631 Anemia in chronic kidney disease: Secondary | ICD-10-CM

## 2022-08-23 DIAGNOSIS — Z5111 Encounter for antineoplastic chemotherapy: Secondary | ICD-10-CM

## 2022-08-23 DIAGNOSIS — D649 Anemia, unspecified: Secondary | ICD-10-CM

## 2022-08-23 DIAGNOSIS — D701 Agranulocytosis secondary to cancer chemotherapy: Secondary | ICD-10-CM | POA: Diagnosis not present

## 2022-08-23 DIAGNOSIS — F419 Anxiety disorder, unspecified: Secondary | ICD-10-CM

## 2022-08-23 DIAGNOSIS — I272 Pulmonary hypertension, unspecified: Secondary | ICD-10-CM | POA: Diagnosis not present

## 2022-08-23 LAB — CBC WITH DIFFERENTIAL (CANCER CENTER ONLY)
Abs Immature Granulocytes: 0.09 10*3/uL — ABNORMAL HIGH (ref 0.00–0.07)
Basophils Absolute: 0 10*3/uL (ref 0.0–0.1)
Basophils Relative: 1 %
Eosinophils Absolute: 0.2 10*3/uL (ref 0.0–0.5)
Eosinophils Relative: 4 %
HCT: 33.7 % — ABNORMAL LOW (ref 39.0–52.0)
Hemoglobin: 11.4 g/dL — ABNORMAL LOW (ref 13.0–17.0)
Immature Granulocytes: 2 %
Lymphocytes Relative: 8 %
Lymphs Abs: 0.4 10*3/uL — ABNORMAL LOW (ref 0.7–4.0)
MCH: 32.7 pg (ref 26.0–34.0)
MCHC: 33.8 g/dL (ref 30.0–36.0)
MCV: 96.6 fL (ref 80.0–100.0)
Monocytes Absolute: 0.8 10*3/uL (ref 0.1–1.0)
Monocytes Relative: 14 %
Neutro Abs: 3.9 10*3/uL (ref 1.7–7.7)
Neutrophils Relative %: 71 %
Platelet Count: 226 10*3/uL (ref 150–400)
RBC: 3.49 MIL/uL — ABNORMAL LOW (ref 4.22–5.81)
RDW: 16.3 % — ABNORMAL HIGH (ref 11.5–15.5)
WBC Count: 5.4 10*3/uL (ref 4.0–10.5)
nRBC: 0 % (ref 0.0–0.2)

## 2022-08-23 LAB — CMP (CANCER CENTER ONLY)
ALT: 45 U/L — ABNORMAL HIGH (ref 0–44)
AST: 38 U/L (ref 15–41)
Albumin: 4 g/dL (ref 3.5–5.0)
Alkaline Phosphatase: 88 U/L (ref 38–126)
Anion gap: 8 (ref 5–15)
BUN: 21 mg/dL (ref 8–23)
CO2: 24 mmol/L (ref 22–32)
Calcium: 8.8 mg/dL — ABNORMAL LOW (ref 8.9–10.3)
Chloride: 104 mmol/L (ref 98–111)
Creatinine: 1.13 mg/dL (ref 0.61–1.24)
GFR, Estimated: >60 mL/min (ref >60)
Glucose, Bld: 124 mg/dL — ABNORMAL HIGH (ref 70–99)
Potassium: 3.9 mmol/L (ref 3.5–5.1)
Sodium: 136 mmol/L (ref 135–145)
Total Bilirubin: 0.7 mg/dL (ref 0.3–1.2)
Total Protein: 7.1 g/dL (ref 6.5–8.1)

## 2022-08-23 LAB — SAMPLE TO BLOOD BANK

## 2022-08-23 MED ORDER — SODIUM CHLORIDE 0.9 % IV SOLN
900.0000 mg/m2 | Freq: Once | INTRAVENOUS | Status: AC
  Administered 2022-08-23: 2090 mg via INTRAVENOUS
  Filled 2022-08-23: qty 2.36

## 2022-08-23 MED ORDER — ALPRAZOLAM 0.5 MG PO TABS: 0.5000 mg | ORAL_TABLET | Freq: Two times a day (BID) | ORAL | 0 refills | Status: DC | PRN

## 2022-08-23 MED ORDER — PROCHLORPERAZINE MALEATE 10 MG PO TABS
10.0000 mg | ORAL_TABLET | Freq: Once | ORAL | Status: AC
  Administered 2022-08-23: 10 mg via ORAL
  Filled 2022-08-23: qty 1

## 2022-08-23 MED ORDER — LIDOCAINE-PRILOCAINE 2.5-2.5 % EX CREA
1.0000 | TOPICAL_CREAM | CUTANEOUS | 11 refills | Status: DC | PRN
Start: 2022-08-23 — End: 2022-11-01

## 2022-08-23 MED ORDER — HEPARIN SOD (PORK) LOCK FLUSH 100 UNIT/ML IV SOLN
500.0000 [IU] | Freq: Once | INTRAVENOUS | Status: AC | PRN
  Administered 2022-08-23: 500 [IU]
  Filled 2022-08-23: qty 5

## 2022-08-23 MED ORDER — SODIUM CHLORIDE 0.9 % IV SOLN
Freq: Once | INTRAVENOUS | Status: AC
  Filled 2022-08-23: qty 250

## 2022-08-23 NOTE — Assessment & Plan Note (Signed)
Encourage oral hydration and avoid nephrotoxins.   

## 2022-08-23 NOTE — Assessment & Plan Note (Signed)
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  Hold ferrous sulfate 325mg  daily as it changes his stool color. Iron panel showed ferritin is above 200. Trend hemoglobin weekly.

## 2022-08-23 NOTE — Patient Instructions (Signed)
[facility] [place] [facility]  Discharge Instructions: Thank you for choosing [facility] to provide your oncology and hematology care.  If you have a lab appointment with the Cancer Center, please go directly to the Cancer Center and check in at the registration area.  Wear comfortable clothing and clothing appropriate for easy access to any Portacath or PICC line.   We strive to give you quality time with your provider. You may need to reschedule your appointment if you arrive late (15 or more minutes).  Arriving late affects you and other patients whose appointments are after yours.  Also, if you miss three or more appointments without notifying the office, you may be dismissed from the clinic at the provider's discretion.      For prescription refill requests, have your pharmacy contact our office and allow 72 hours for refills to be completed.    Today you received the following chemotherapy and/or immunotherapy agents Gemzar       To help prevent nausea and vomiting after your treatment, we encourage you to take your nausea medication as directed.  BELOW ARE SYMPTOMS THAT SHOULD BE REPORTED IMMEDIATELY: *FEVER GREATER THAN 100.4 F (38 C) OR HIGHER *CHILLS OR SWEATING *NAUSEA AND VOMITING THAT IS NOT CONTROLLED WITH YOUR NAUSEA MEDICATION *UNUSUAL SHORTNESS OF BREATH *UNUSUAL BRUISING OR BLEEDING *URINARY PROBLEMS (pain or burning when urinating, or frequent urination) *BOWEL PROBLEMS (unusual diarrhea, constipation, pain near the anus) TENDERNESS IN MOUTH AND THROAT WITH OR WITHOUT PRESENCE OF ULCERS (sore throat, sores in mouth, or a toothache) UNUSUAL RASH, SWELLING OR PAIN  UNUSUAL VAGINAL DISCHARGE OR ITCHING   Items with * indicate a potential emergency and should be followed up as soon as possible or go to the Emergency Department if any problems should occur.  Please show the CHEMOTHERAPY ALERT CARD or IMMUNOTHERAPY ALERT CARD at check-in to  the Emergency Department and triage nurse.  Should you have questions after your visit or need to cancel or reschedule your appointment, please contact [facility] [place] [facility]  [phone] and follow the prompts.  Office hours are 8:00 a.m. to 4:30 p.m. Monday - Friday. Please note that voicemails left after 4:00 p.m. may not be returned until the following business day.  We are closed weekends and major holidays. You have access to a nurse at all times for urgent questions. Please call the main number to the clinic [phone] and follow the prompts.  For any non-urgent questions, you may also contact your provider using MyChart. We now offer e-Visits for anyone 18 and older to request care online for non-urgent symptoms. For details visit [email or web address].   Also download the MyChart app! Go to the app store, search "MyChart", open the app, select , and log in with your MyChart username and password.    

## 2022-08-23 NOTE — Assessment & Plan Note (Signed)
Patient has no radiographic evidence of metastatic melanoma on recent CT scan done at [facility] immunotherapy. Continue surveillance 

## 2022-08-23 NOTE — Progress Notes (Signed)
Hematology/Oncology Progress note [phone] [phone]    CHIEF COMPLAINTS/REASON FOR VISIT:  Follow up for melanoma, cholangiocarcinoma.    ASSESSMENT & PLAN:   Cancer Staging  Cholangiocarcinoma [facility]) Staging form: Intrahepatic Bile Duct, AJCC 8th Edition - Pathologic stage from 01/25/2022: Stage Unknown (pT1a, pNX, cM0) - Signed by Rickard Patience, MD on 02/21/2022  Malignant melanoma of overlapping sites [facility]) Staging form: Melanoma of the Skin, AJCC 8th Edition - Pathologic: Stage Unknown (rpTX, pN1b, cM0) - Signed by Rickard Patience, MD on 07/27/2020 - Pathologic: No stage assigned - Unsigned   Cholangiocarcinoma (HCC) T1a Nx Cholangiocarcinoma Pathology was reviewed and discussed with patient.  S/p chemotherapy Xeloda 825mg /m2 BID+ concurrent RT Previously on adjuvant Xeloda [1000 mg/m2 BID for 14 of every 21 days] -dose reduced to 825mg /m2 BID due to skin rash, still not able to tolerate- discontinue.  switched to gemcitabine weekly, 2 weeks on, 1 week off, [adjustment due to cytopenia] Labs are reviewed and discussed with patient.  proceed with cycle 3 D1 gemcitabine -dose reduce to 900mg /m2 1 week lab gemcitabine.      Malignant melanoma of overlapping sites [facility]) Patient has no radiographic evidence of metastatic melanoma on recent CT scan done at [facility] immunotherapy. 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  Hold ferrous sulfate 325mg  daily as it changes his stool color. Iron panel showed ferritin is above 200. Trend hemoglobin weekly.   Stage 3a chronic kidney disease (HCC) Encourage oral hydration and avoid nephrotoxins.      Encounter for antineoplastic chemotherapy Chemotherapy plan as listed above.   Anxiety Recommend Xanax 0.5 mg every 12 hours as needed  for anxiety.  Rationale potential side effects reviewed and discussed with patient.     All questions were answered. The  patient knows to call the clinic with any problems, questions or concerns.  Rickard Patience, MD, PhD [facility] Hematology Oncology 08/23/2022    HISTORY OF PRESENTING ILLNESS:   Edward Pitts is a  62 y.o.  male presents for recurrent malignant melanoma.   Oncology History  Malignant melanoma of overlapping sites [facility])  04/16/2019 Cancer Staging   Staging form: Melanoma of the Skin, AJCC 8th Edition - Pathologic: Stage Unknown (rpTX, pN1b, cM0) - Signed by Rickard Patience, MD on 07/27/2020 Stage prefix: Recurrence    04/23/2019 Initial Diagnosis   Malignant melanoma   -He has a history of left lower extremity melanoma in 2011, status post local excision -04/16/2019 patient underwent left groin mass resection  Resection pathology showed malignant melanoma, replacing a lymph node, with extracapsular extension, peripheral and deep margins involved.  Left inguinal contents, all 7 lymph nodes were negative for melanoma in the lymph nodes. Extranodal melanoma identified in lymphatic and interstitium between nodes -PDL1 80% TPS    07/07/2019 -  Radiation Therapy   status post adjuvant radiation.   07/23/2019 - 07/21/2021 Chemotherapy   Nivolumab q14d      06/29/2020 Imaging   CT chest abdomen pelvis showed stable postoperative appearance of the left groin.  No evidence of local recurrence.  No evidence of metastatic disease in the chest abdomen or pelvis.  Hepatic steatosis.  Stable subcentimeter fluid attenuation lesion of the lateral right lobe of the liver, likely benign cyst or hemangioma.  Coronary artery disease.  Aortic atherosclerosis   10/20/2020 Imaging   CT chest abdomen pelvis showed stable postoperative/radiation appearance of the left groin.  No evidence of local recurrence/metastatic disease within the chest  abdomen/pelvis.  Fatty liver disease.  Diverticulosis without evidence of typhlitis.  Aortic atherosclerosis   03/10/2021 Imaging   MRI brain without contrast showed no definitive  evidence of intracranial metastatic disease.  Study is limited by absence of intravenous contrast.     04/26/2021 Imaging   CT chest abdomen pelvis without contrast showed stable post operative changes of left groin with no evidence of recurrent disease.  No evidence of metastatic disease in the chest abdomen pelvis.  Aortic atherosclerosis   08/08/2021 - 08/16/2021 Hospital Admission    patient was hospitalized due to NSTEMI status post CABG x3.  He also had pacemaker The echocardiogram showed left ventricular ejection fraction of 65 to 70%,   09/15/2021 Imaging   CT chest abdomen pelvis w contrast  IMPRESSION: 1. Subtle hypodense 9 mm lesion in the left lobe of the liver is new from prior imaging including previous contrasted CT dating back to December 10, 2019, with the lesion appearing to equilibrate with background liver on delayed imaging sequence but is incompletely evaluated on this imaging study and technically nonspecific possibly reflecting a benign perfusional variant and while its appearance is not typical for that of a melanoma metastasis, it is not excluded on this examination. Suggest more definitive characterization by hepatic protocol MRI with and without contrast. 2. Stable postoperative changes in the left groin without evidence of local recurrent disease.3. No evidence of metastatic disease in the chest or pelvis.4.  Aortic Atherosclerosis (ICD10-I70.0).      09/23/2021 Imaging   Contrast-enhanced liver ultrasound Mildly hypoenhancing 2.2 cm mass in the posterior aspect of the left lobe of the liver with washout characteristics concerning for non hepatocellular malignancy, concerning for melanotic metastasis given history. The lesion is in an unfavorable location for percutaneous biopsy. Consider PET-CT for further characterization   10/21/2021 -  Chemotherapy   Nivolumab q14d      11/10/2021 Imaging   MRI Brain w wo contrast Negative for metastatic disease to the brain    11/10/2021 Imaging   MRI abdomen w wo contrast 1. Lesion of the posterior superior left lobe of the liver, hepatic segment II, measuring 1.9 x 1.8 cm corresponding to findings of prior imaging. Evaluation is somewhat limited breath motion artifact however there is subtle associated rim enhancement of this lesion. Findings are most in keeping with a hepatic metastasis in the setting of known recurrent melanoma. 2. Mild hepatic steatosis. 3. Cardiomegaly.    02/27/2022 - 02/27/2022 Chemotherapy   Patient is on Treatment Plan : Capecitabine (825 mg/m2 bid) + XRT     07/12/2022 -  Chemotherapy   Patient is on Treatment Plan : PANCREAS Gemcitabine D1,8(1000) q21d     Cholangiocarcinoma (HCC)  12/19/2021 Initial Diagnosis   Liver biopsy showed carcinoma  -The slides on the patient's prior left inguinal lymph node biopsy ([id number]) with metastatic melanoma were reviewed in conjunction with  this case. The morphology of the malignant cells between the two cases are dissimilar. A limited panel of immunohistochemical stains was performed and the neoplastic cells are positive for superpancytokeratin, cytokeratin 7 (diffuse, strong), cytokeratin 20 (patchy, moderate) and negative for S100, SOX-10, and HepPar-1.These findings are consistent with carcinoma. The morphologic findings and pattern of immunohistochemical staining are non-specific and possible sites of origin include but are not limited to pancreaticobiliary tract, GI tract, prostate, kidney, lung, and breast. Per [facility], the patient had a PET scan performed in July 2023 which demonstrated a single hepatic  hypermetabolic lesion without other sites of disease.  01/19/2022 Surgery   Liver lesion resection at [facility] by Dr.Zani  A.  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  Tumor extends to hepatic parenchymal margin, but all other margins are negative for tumor. See synoptic  report and comment.   Background liver with: Mild steatosis (20%).  No definitive evidence of steatohepatitis.   Mild periportal fibrosis (trichrome and reticulin). No stainable iron (Prussian blue stain).  No evidence to support alpha-1-antitrypsin deficiency on PAS-D stain.   pT1a pNx   01/25/2022 Cancer Staging   Staging form: Intrahepatic Bile Duct, AJCC 8th Edition - Pathologic stage from 01/25/2022: Stage Unknown (pT1a, pNX, cM0) - Signed by Rickard Patience, MD on 02/21/2022 Stage prefix: Initial diagnosis   03/23/2022 - 05/01/2022 Radiation Therapy   Concurrent Xeloda 825mg /m2 BID + Radiation.    05/08/2022 -  Chemotherapy   Xeloda [1000 mg/m2 BID for 14 of every 21 days], plan 4 months.   05/08/2022, cycle 1 Xeloda.  Xeloda was stopped on 05/19/22 due to developing rash on face as well as dorsum of hands. Treated with steroid,symptoms resolved.  3/4-3/13/2024  Xeloda was stopped early due to groin cellulitis, he also developed similar skin rash again  Plan 3/25 cycle 3 dose reduce Xeloda 825 mg/m2   2000mg  BID x 14 days.   05/09/2022 Imaging   CT chest abdomen pelvis with contrast at [facility] showed Status post partial left hepatectomy without CT evidence of recurrent or metastatic disease in the chest, abdomen, pelvis.     07/24/2022 - 07/26/2022 Hospital Admission   Patient was hospitalized due to symptomatic anemia, status post 1 unit of PRBC transfusion. Also neutropenia secondary to chemotherapy.  Nadir was 0.9.  Patient was afebrile.    04/18/2021, patient establish care with neurology Dr. Barbaraann Cao for intermittent altered cognition.  he was recommended to start Vimpat.    INTERVAL HISTORY Edward Pitts is a 62 y.o. male who has above history reviewed by me today presents for follow up visit for management of inguinal nodal recurrence of melanoma, resected T1a cholangiocarcinoma. Patient has felt fatigue and lightheaded last week with a hemoglobin of 8.8 and received 1 unit of PRBC  transfusion.  He reports symptom has improved.  His mother was hospitalized at that time. He reports feeling well, denies any weakness or lightheaded. We discussed about possibility of anxiety playing a role causing his fatigue and lightheaded symptoms given the history patient reported that whenever his mother is sick in the hospital, he feels sick.     :Review of Systems  Constitutional:  Positive for fatigue. Negative for appetite change, chills, fever and unexpected weight change.  HENT:   Negative for hearing loss and voice change.   Eyes:  Negative for eye problems and icterus.  Respiratory:  Negative for chest tightness, cough and shortness of breath.   Cardiovascular:  Positive for leg swelling. Negative for chest pain.  Gastrointestinal:  Negative for abdominal distention and abdominal pain.  Endocrine: Negative for hot flashes.  Genitourinary:  Negative for difficulty urinating, dysuria and frequency.   Musculoskeletal:        Status post left hip replacement  Skin:  Negative for itching.       Skin hypo-pigmentation on upper extremities, no change   Neurological:  Negative for light-headedness and numbness.       Spells  Hematological:  Negative for adenopathy. Does not bruise/bleed easily.  Psychiatric/Behavioral:  Negative for confusion.     MEDICAL  HISTORY:  Past Medical History:  Diagnosis Date   Anemia    iron treatments   Anxiety    Aortic atherosclerosis (HCC)    Arthritis    Cancer of groin (HCC) 2021   left groin, resected, radiation   Cataract    Complication of anesthesia    PONV   Coronary artery disease    Dizziness of unknown etiology    has led to seizures and passing out.   Family history of adverse reaction to anesthesia    PONV mother   GERD (gastroesophageal reflux disease)    History of complete heart block    PPM placed   Hyperlipidemia    Hypertension    LBBB (left bundle branch block)    Lymphedema of left leg    uses thigh high  compression stockings   Melanoma (HCC) 2012   skin cancer, left thigh   OSA on CPAP    PONV (postoperative nausea and vomiting) 04/16/2019   Port-A-Cath in place    RIGHT chest wall   Presence of cardiac pacemaker    [employer]   Seizures (HCC)    still has episodes of dizziness. last event 1 month ago (march 2022) and will pass out. takes clonazepam    SURGICAL HISTORY: Past Surgical History:  Procedure Laterality Date   CORONARY ARTERY BYPASS GRAFT N/A 08/10/2021   Procedure: CORONARY ARTERY BYPASS GRAFTING (CABG) X 3 USING LEFT INTERNAL MAMMARY ARTERY AND RIGHT GREATER SAPHENOUS VEIN;  Surgeon: Lovett Sox, MD;  Location: [facility] OR;  Service: Open Heart Surgery;  Laterality: N/A;   CT RADIATION THERAPY GUIDE     left groin   dental implant     permanent implant   ENDOVEIN HARVEST OF GREATER SAPHENOUS VEIN Right 08/10/2021   Procedure: ENDOVEIN HARVEST OF GREATER SAPHENOUS VEIN;  Surgeon: Lovett Sox, MD;  Location: [facility] OR;  Service: Open Heart Surgery;  Laterality: Right;   KNEE SURGERY Left    arthroscopy   LEFT HEART CATH AND CORONARY ANGIOGRAPHY Left 06/29/2017   Procedure: LEFT HEART CATH AND CORONARY ANGIOGRAPHY;  Surgeon: Lamar Blinks, MD;  Location: [facility] INVASIVE CV LAB;  Service: Cardiovascular;  Laterality: Left;   LEFT HEART CATH AND CORONARY ANGIOGRAPHY N/A 08/08/2021   Procedure: LEFT HEART CATH AND CORONARY ANGIOGRAPHY;  Surgeon: Lamar Blinks, MD;  Location: [facility] INVASIVE CV LAB;  Service: Cardiovascular;  Laterality: N/A;   LYMPH NODE DISSECTION Left 04/16/2019   Procedure: Left inguinal Lymph Node Dissection;  Surgeon: Almond Lint, MD;  Location: [facility] OR;  Service: General;  Laterality: Left;   MELANOMA EXCISION Left 04/16/2019   Procedure: MELANOMA EXCISION LEFT GROIN MASS;  Surgeon: Almond Lint, MD;  Location: [facility] OR;  Service: General;  Laterality: Left;   MELANOMA EXCISION WITH SENTINEL LYMPH NODE BIOPSY Left 2012   Left calf    PACEMAKER INSERTION N/A  08/26/2018   Procedure: INSERTION PACEMAKER;  Surgeon: Marcina Millard, MD;  Location: [facility] ORS;  Service: Cardiovascular;  Laterality: N/A;   PORTA CATH INSERTION N/A 08/26/2019   Procedure: PORTA CATH INSERTION;  Surgeon: Renford Dills, MD;  Location: [facility] INVASIVE CV LAB;  Service: Cardiovascular;  Laterality: N/A;   SUPERFICIAL LYMPH NODE BIOPSY / EXCISION Left 2020   lymph nodes removed around left groin melanoma site   TEE WITHOUT CARDIOVERSION N/A 08/10/2021   Procedure: TRANSESOPHAGEAL ECHOCARDIOGRAM (TEE);  Surgeon: Lovett Sox, MD;  Location: [facility] OR;  Service: Open Heart Surgery;  Laterality: N/A;   TEMPORARY PACEMAKER N/A 08/25/2018  Procedure: TEMPORARY PACEMAKER;  Surgeon: Tonny Bollman, MD;  Location: [facility] INVASIVE CV LAB;  Service: Cardiovascular;  Laterality: N/A;   TOTAL HIP ARTHROPLASTY Left 07/14/2020   Procedure: TOTAL HIP ARTHROPLASTY;  Surgeon: Donato Heinz, MD;  Location: [facility] ORS;  Service: Orthopedics;  Laterality: Left;    SOCIAL HISTORY: Social History   Socioeconomic History   Marital status: Married    Spouse name: [name]    Number of children: 7   Years of education: 12   Highest education level: Not on file  Occupational History    Comment: disability  Tobacco Use   Smoking status: Never   Smokeless tobacco: Never  Vaping Use   Vaping Use: Never used  Substance and Sexual Activity   Alcohol use: No   Drug use: No   Sexual activity: Not Currently  Other Topics Concern   Not on file  Social History Narrative   Lives with  Wife,   Has 2 small dogs   Caffeine use: sodas (2 per day)      Out of work on disability.  Has a walk in shower. No stairs to climb   Oncology treatment ongoing. Uses port a cath for treatment.      pacemaker   Social Determinants of Health   Financial Resource Strain: Low Risk  (02/12/2019)   Overall Financial Resource Strain (CARDIA)    Difficulty of Paying Living Expenses: Not hard at all  Food Insecurity:  Food Insecurity Present (02/21/2022)   Hunger Vital Sign    Worried About Running Out of Food in the Last Year: Sometimes true    Ran Out of Food in the Last Year: Often true  Transportation Needs: No Transportation Needs (07/25/2022)   PRAPARE - Administrator, Civil Service (Medical): No    Lack of Transportation (Non-Medical): No  Physical Activity: Unknown (02/12/2019)   Exercise Vital Sign    Days of Exercise per Week: 0 days    Minutes of Exercise per Session: Not on file  Stress: No Stress Concern Present (02/12/2019)   [employer] of Occupational Health - Occupational Stress Questionnaire    Feeling of Stress : Only a little  Social Connections: Unknown (02/12/2019)   Social Connection and Isolation Panel [NHANES]    Frequency of Communication with Friends and Family: More than three times a week    Frequency of Social Gatherings with Friends and Family: Not on file    Attends Religious Services: Not on file    Active Member of Clubs or Organizations: Not on file    Attends Banker Meetings: Not on file    Marital Status: Married  Intimate Partner Violence: Not At Risk (07/25/2022)   Humiliation, Afraid, Rape, and Kick questionnaire    Fear of Current or Ex-Partner: No    Emotionally Abused: No    Physically Abused: No    Sexually Abused: No    FAMILY HISTORY: Family History  Problem Relation Age of Onset   Cancer Paternal Grandmother     ALLERGIES:  is allergic to ibuprofen, levetiracetam, nsaids, and capecitabine.  MEDICATIONS:  Current Outpatient Medications  Medication Sig Dispense Refill   ALPRAZolam (XANAX) 0.5 MG tablet Take 1 tablet (0.5 mg total) by mouth 2 (two) times daily as needed for anxiety. 30 tablet 0   apixaban (ELIQUIS) 5 MG TABS tablet Take 5 mg by mouth 2 (two) times daily.     aspirin 81 MG EC tablet Take 81 mg by mouth daily.  atorvastatin (LIPITOR) 20 MG tablet Take 20 mg by mouth daily.      Carboxymeth-Glyc-Polysorb PF (REFRESH OPTIVE MEGA-3) 0.5-1-0.5 % SOLN Place 1 drop into both eyes daily as needed (dry eyes).     clonazePAM (KLONOPIN) 0.5 MG tablet TAKE 1 TABLET BY MOUTH IN THE MORNING AND 1 BY MOUTH IN THE EVENING per pt 120 tablet 1   clotrimazole (CLOTRIMAZOLE ANTI-FUNGAL) 1 % cream Apply 1 Application topically 2 (two) times daily. Apply to the L inguinal fold 30 g 0   ferrous sulfate 325 (65 FE) MG EC tablet Take 1 tablet (325 mg total) by mouth daily. 180 tablet 1   hydrocortisone 2.5 % ointment Apply 1 application. topically daily as needed (itching).     Lacosamide 100 MG TABS Take 1 tablet (100 mg total) by mouth in the morning and at bedtime. 60 tablet 3   metoprolol succinate (TOPROL-XL) 25 MG 24 hr tablet Take 25 mg by mouth daily.     Multiple Vitamin (MULTIVITAMIN WITH MINERALS) TABS tablet Take 1 tablet by mouth daily. Centrum Silver     nystatin (MYCOSTATIN/NYSTOP) powder Apply 1 Application topically 3 (three) times daily. 60 g 1   omeprazole (PRILOSEC) 20 MG capsule TAKE 1 CAPSULE BY MOUTH EVERY DAY 90 capsule 0   ondansetron (ZOFRAN) 4 MG tablet TAKE 1 TABLET BY MOUTH EVERY 8 HOURS AS NEEDED FOR NAUSEA AND VOMITING 90 tablet 1   lidocaine-prilocaine (EMLA) cream Apply 1 Application topically as needed. Apply small amount of cream to port site approx 1-2 hours prior to appointment. 30 g 11   No current facility-administered medications for this visit.   Facility-Administered Medications Ordered in Other Visits  Medication Dose Route Frequency Provider Last Rate Last Admin   heparin lock flush 100 UNIT/ML injection            heparin lock flush 100 UNIT/ML injection              PHYSICAL EXAMINATION: ECOG PERFORMANCE STATUS: 1 - Symptomatic but completely ambulatory Vitals:   08/23/22 0907  BP: (!) 141/69  Pulse: 74  Resp: 18  Temp: (!) 97.5 F (36.4 C)   Filed Weights   08/23/22 0907  Weight: 249 lb 12.8 oz (113.3 kg)    Physical  Exam Constitutional:      General: He is not in acute distress.    Comments: Patient ambulates independently  HENT:     Head: Normocephalic and atraumatic.  Eyes:     General: No scleral icterus.    Pupils: Pupils are equal, round, and reactive to light.  Cardiovascular:     Rate and Rhythm: Normal rate and regular rhythm.     Heart sounds: Normal heart sounds.  Pulmonary:     Effort: Pulmonary effort is normal. No respiratory distress.     Breath sounds: No wheezing.  Abdominal:     General: Bowel sounds are normal. There is no distension.     Palpations: Abdomen is soft. There is no mass.     Tenderness: There is no abdominal tenderness.     Comments:    Musculoskeletal:        General: No deformity. Normal range of motion.     Cervical back: Normal range of motion and neck supple.     Comments: Left lower extremity edema-chronic   Skin:    General: Skin is warm and dry.     Comments: Bilateral dorsum had raised erythematous rash-improved Left groin chronic edema with erythematous  changes.-Improved with residual erythema  Neurological:     Mental Status: He is alert and oriented to person, place, and time. Mental status is at baseline.     Cranial Nerves: No cranial nerve deficit.     Coordination: Coordination normal.  Psychiatric:        Mood and Affect: Mood normal.     LABORATORY DATA:  I have reviewed the data as listed    Latest Ref Rng & Units 08/23/2022    8:44 AM 08/15/2022    9:30 AM 08/09/2022    8:30 AM  CBC  WBC 4.0 - 10.5 K/uL 5.4  1.6  2.2   Hemoglobin 13.0 - 17.0 g/dL 16.1  8.8  9.4   Hematocrit 39.0 - 52.0 % 33.7  25.8  28.2   Platelets 150 - 400 K/uL 226  100  277       Latest Ref Rng & Units 08/23/2022    8:44 AM 08/09/2022    8:30 AM 08/02/2022    7:59 AM  CMP  Glucose 70 - 99 mg/dL 096  045  409   BUN 8 - 23 mg/dL 21  18  21    Creatinine 0.61 - 1.24 mg/dL 8.11  9.14  7.82   Sodium 135 - 145 mmol/L 136  138  137   Potassium 3.5 - 5.1 mmol/L  3.9  3.9  4.3   Chloride 98 - 111 mmol/L 104  106  104   CO2 22 - 32 mmol/L 24  23  26    Calcium 8.9 - 10.3 mg/dL 8.8  8.8  9.1   Total Protein 6.5 - 8.1 g/dL 7.1  6.6  7.0   Total Bilirubin 0.3 - 1.2 mg/dL 0.7  0.5  0.6   Alkaline Phos 38 - 126 U/L 88  78  87   AST 15 - 41 U/L 38  53  41   ALT 0 - 44 U/L 45  58  42      RADIOGRAPHIC STUDIES: I have personally reviewed the radiological images as listed and agreed with the findings in the report. DG Chest Port 1 View  Result Date: 07/24/2022 CLINICAL DATA:  Dyspnea on exertion EXAM: PORTABLE CHEST 1 VIEW COMPARISON:  Chest x-ray 02/21/2022 FINDINGS: Right chest port catheter tip projects over the SVC. Left-sided pacemaker again noted. Sternotomy wires and mediastinal clips are present. The cardiomediastinal silhouette is within normal limits. The lungs are clear. There is no pleural effusion or pneumothorax. No acute fractures are seen. IMPRESSION: No active disease. Electronically Signed   By: Darliss Cheney M.D.   On: 07/24/2022 17:47

## 2022-08-23 NOTE — Assessment & Plan Note (Addendum)
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  S/p chemotherapy Xeloda 825mg /m2 BID+ concurrent RT Previously on adjuvant Xeloda [1000 mg/m2 BID for 14 of every 21 days] -dose reduced to 825mg /m2 BID due to skin rash, still not able to tolerate- discontinue.  switched to gemcitabine weekly, 2 weeks on, 1 week off, [adjustment due to cytopenia] Labs are reviewed and discussed with patient.  proceed with cycle 3 D1 gemcitabine -dose reduce to 900mg /m2 1 week lab gemcitabine.

## 2022-08-23 NOTE — Patient Instructions (Signed)

## 2022-08-23 NOTE — Assessment & Plan Note (Signed)
Chemotherapy plan as listed above 

## 2022-08-23 NOTE — Assessment & Plan Note (Signed)
Recommend Xanax 0.5 mg every 12 hours as needed  for anxiety.  Rationale potential side effects reviewed and discussed with patient.

## 2022-08-30 ENCOUNTER — Inpatient Hospital Stay: Payer: 59

## 2022-08-30 ENCOUNTER — Inpatient Hospital Stay: Payer: 59 | Attending: Oncology

## 2022-08-30 VITALS — BP 130/58 | HR 61 | Temp 97.7°F | Resp 18 | Wt 251.3 lb

## 2022-08-30 DIAGNOSIS — C4359 Malignant melanoma of other part of trunk: Secondary | ICD-10-CM | POA: Diagnosis not present

## 2022-08-30 DIAGNOSIS — N1831 Chronic kidney disease, stage 3a: Secondary | ICD-10-CM | POA: Diagnosis not present

## 2022-08-30 DIAGNOSIS — C221 Intrahepatic bile duct carcinoma: Secondary | ICD-10-CM | POA: Diagnosis not present

## 2022-08-30 DIAGNOSIS — Z452 Encounter for adjustment and management of vascular access device: Secondary | ICD-10-CM | POA: Diagnosis not present

## 2022-08-30 DIAGNOSIS — Z5111 Encounter for antineoplastic chemotherapy: Secondary | ICD-10-CM | POA: Insufficient documentation

## 2022-08-30 DIAGNOSIS — C438 Malignant melanoma of overlapping sites of skin: Secondary | ICD-10-CM

## 2022-08-30 DIAGNOSIS — Z95828 Presence of other vascular implants and grafts: Secondary | ICD-10-CM

## 2022-08-30 DIAGNOSIS — D6481 Anemia due to antineoplastic chemotherapy: Secondary | ICD-10-CM | POA: Insufficient documentation

## 2022-08-30 LAB — CBC WITH DIFFERENTIAL (CANCER CENTER ONLY)
Abs Immature Granulocytes: 0.07 10*3/uL (ref 0.00–0.07)
Basophils Absolute: 0 10*3/uL (ref 0.0–0.1)
Basophils Relative: 2 %
Eosinophils Absolute: 0 10*3/uL (ref 0.0–0.5)
Eosinophils Relative: 2 %
HCT: 29.5 % — ABNORMAL LOW (ref 39.0–52.0)
Hemoglobin: 9.9 g/dL — ABNORMAL LOW (ref 13.0–17.0)
Immature Granulocytes: 4 %
Lymphocytes Relative: 21 %
Lymphs Abs: 0.3 10*3/uL — ABNORMAL LOW (ref 0.7–4.0)
MCH: 32.6 pg (ref 26.0–34.0)
MCHC: 33.6 g/dL (ref 30.0–36.0)
MCV: 97 fL (ref 80.0–100.0)
Monocytes Absolute: 0.4 10*3/uL (ref 0.1–1.0)
Monocytes Relative: 25 %
Neutro Abs: 0.8 10*3/uL — ABNORMAL LOW (ref 1.7–7.7)
Neutrophils Relative %: 46 %
Platelet Count: 224 10*3/uL (ref 150–400)
RBC: 3.04 MIL/uL — ABNORMAL LOW (ref 4.22–5.81)
RDW: 15.6 % — ABNORMAL HIGH (ref 11.5–15.5)
WBC Count: 1.7 10*3/uL — ABNORMAL LOW (ref 4.0–10.5)
nRBC: 3.6 % — ABNORMAL HIGH (ref 0.0–0.2)

## 2022-08-30 LAB — CMP (CANCER CENTER ONLY)
ALT: 67 U/L — ABNORMAL HIGH (ref 0–44)
AST: 45 U/L — ABNORMAL HIGH (ref 15–41)
Albumin: 3.7 g/dL (ref 3.5–5.0)
Alkaline Phosphatase: 80 U/L (ref 38–126)
Anion gap: 6 (ref 5–15)
BUN: 18 mg/dL (ref 8–23)
CO2: 23 mmol/L (ref 22–32)
Calcium: 8.9 mg/dL (ref 8.9–10.3)
Chloride: 109 mmol/L (ref 98–111)
Creatinine: 1.14 mg/dL (ref 0.61–1.24)
GFR, Estimated: >60 mL/min (ref >60)
Glucose, Bld: 117 mg/dL — ABNORMAL HIGH (ref 70–99)
Potassium: 4.1 mmol/L (ref 3.5–5.1)
Sodium: 138 mmol/L (ref 135–145)
Total Bilirubin: 0.4 mg/dL (ref 0.3–1.2)
Total Protein: 6.7 g/dL (ref 6.5–8.1)

## 2022-08-30 MED ORDER — SODIUM CHLORIDE 0.9% FLUSH
10.0000 mL | Freq: Once | INTRAVENOUS | Status: AC
  Administered 2022-08-30: 10 mL via INTRAVENOUS
  Filled 2022-08-30: qty 10

## 2022-08-30 MED ORDER — HEPARIN SOD (PORK) LOCK FLUSH 100 UNIT/ML IV SOLN
500.0000 [IU] | Freq: Once | INTRAVENOUS | Status: AC
  Administered 2022-08-30: 500 [IU] via INTRAVENOUS
  Filled 2022-08-30: qty 5

## 2022-08-31 ENCOUNTER — Inpatient Hospital Stay: Payer: 59

## 2022-09-05 DIAGNOSIS — Z8509 Personal history of malignant neoplasm of other digestive organs: Secondary | ICD-10-CM | POA: Diagnosis not present

## 2022-09-05 DIAGNOSIS — Z79899 Other long term (current) drug therapy: Secondary | ICD-10-CM | POA: Diagnosis not present

## 2022-09-05 DIAGNOSIS — C221 Intrahepatic bile duct carcinoma: Secondary | ICD-10-CM | POA: Diagnosis not present

## 2022-09-05 DIAGNOSIS — Z08 Encounter for follow-up examination after completed treatment for malignant neoplasm: Secondary | ICD-10-CM | POA: Diagnosis not present

## 2022-09-06 ENCOUNTER — Inpatient Hospital Stay: Payer: 59

## 2022-09-06 ENCOUNTER — Telehealth: Payer: Self-pay

## 2022-09-06 ENCOUNTER — Telehealth: Payer: Self-pay | Admitting: *Deleted

## 2022-09-06 DIAGNOSIS — D6481 Anemia due to antineoplastic chemotherapy: Secondary | ICD-10-CM | POA: Diagnosis not present

## 2022-09-06 DIAGNOSIS — N1831 Chronic kidney disease, stage 3a: Secondary | ICD-10-CM | POA: Diagnosis not present

## 2022-09-06 DIAGNOSIS — H40013 Open angle with borderline findings, low risk, bilateral: Secondary | ICD-10-CM | POA: Diagnosis not present

## 2022-09-06 DIAGNOSIS — C221 Intrahepatic bile duct carcinoma: Secondary | ICD-10-CM

## 2022-09-06 DIAGNOSIS — Z452 Encounter for adjustment and management of vascular access device: Secondary | ICD-10-CM | POA: Diagnosis not present

## 2022-09-06 DIAGNOSIS — Z5111 Encounter for antineoplastic chemotherapy: Secondary | ICD-10-CM | POA: Diagnosis not present

## 2022-09-06 DIAGNOSIS — C4359 Malignant melanoma of other part of trunk: Secondary | ICD-10-CM | POA: Diagnosis not present

## 2022-09-06 DIAGNOSIS — C438 Malignant melanoma of overlapping sites of skin: Secondary | ICD-10-CM

## 2022-09-06 DIAGNOSIS — D3132 Benign neoplasm of left choroid: Secondary | ICD-10-CM | POA: Diagnosis not present

## 2022-09-06 DIAGNOSIS — H2513 Age-related nuclear cataract, bilateral: Secondary | ICD-10-CM | POA: Diagnosis not present

## 2022-09-06 LAB — CBC WITH DIFFERENTIAL (CANCER CENTER ONLY)
Abs Immature Granulocytes: 0.08 10*3/uL — ABNORMAL HIGH (ref 0.00–0.07)
Basophils Absolute: 0 10*3/uL (ref 0.0–0.1)
Basophils Relative: 1 %
Eosinophils Absolute: 0.1 10*3/uL (ref 0.0–0.5)
Eosinophils Relative: 3 %
HCT: 33 % — ABNORMAL LOW (ref 39.0–52.0)
Hemoglobin: 11.3 g/dL — ABNORMAL LOW (ref 13.0–17.0)
Immature Granulocytes: 2 %
Lymphocytes Relative: 9 %
Lymphs Abs: 0.4 10*3/uL — ABNORMAL LOW (ref 0.7–4.0)
MCH: 32.9 pg (ref 26.0–34.0)
MCHC: 34.2 g/dL (ref 30.0–36.0)
MCV: 96.2 fL (ref 80.0–100.0)
Monocytes Absolute: 0.8 10*3/uL (ref 0.1–1.0)
Monocytes Relative: 18 %
Neutro Abs: 3 10*3/uL (ref 1.7–7.7)
Neutrophils Relative %: 67 %
Platelet Count: 126 10*3/uL — ABNORMAL LOW (ref 150–400)
RBC: 3.43 MIL/uL — ABNORMAL LOW (ref 4.22–5.81)
RDW: 15.5 % (ref 11.5–15.5)
WBC Count: 4.5 10*3/uL (ref 4.0–10.5)
nRBC: 0.7 % — ABNORMAL HIGH (ref 0.0–0.2)

## 2022-09-06 LAB — SAMPLE TO BLOOD BANK

## 2022-09-06 NOTE — Telephone Encounter (Signed)
Pt's hemoglobin is 11.3. Please cancel blood transfusion on 6/13. Called pt and left detailed message on VM to let him know that we will cancel blood transfusion and to call back if he had further questions.

## 2022-09-06 NOTE — Telephone Encounter (Signed)
Patient called asking if he needs to come in tomorrow since Dr Cathie Hoops said he does not need blood tomorrow. I see that he is still scheduled Please return his call

## 2022-09-06 NOTE — Telephone Encounter (Signed)
Called and left VM earlier to let pt know he does not need blood and message was sent to scheduling to cancel. Ph note in chart.

## 2022-09-06 NOTE — Telephone Encounter (Signed)
Spoke to pt. He is aware he does not need to come in

## 2022-09-07 ENCOUNTER — Inpatient Hospital Stay: Payer: 59

## 2022-09-12 ENCOUNTER — Other Ambulatory Visit: Payer: Self-pay

## 2022-09-12 ENCOUNTER — Other Ambulatory Visit: Payer: Self-pay | Admitting: Oncology

## 2022-09-12 DIAGNOSIS — C438 Malignant melanoma of overlapping sites of skin: Secondary | ICD-10-CM

## 2022-09-13 ENCOUNTER — Inpatient Hospital Stay (HOSPITAL_BASED_OUTPATIENT_CLINIC_OR_DEPARTMENT_OTHER): Payer: 59 | Admitting: Oncology

## 2022-09-13 ENCOUNTER — Inpatient Hospital Stay: Payer: 59

## 2022-09-13 ENCOUNTER — Encounter: Payer: Self-pay | Admitting: Oncology

## 2022-09-13 VITALS — BP 134/63 | HR 64 | Resp 18

## 2022-09-13 VITALS — BP 103/51 | HR 66 | Temp 97.4°F | Resp 18 | Wt 254.7 lb

## 2022-09-13 DIAGNOSIS — Z452 Encounter for adjustment and management of vascular access device: Secondary | ICD-10-CM | POA: Diagnosis not present

## 2022-09-13 DIAGNOSIS — C438 Malignant melanoma of overlapping sites of skin: Secondary | ICD-10-CM | POA: Diagnosis not present

## 2022-09-13 DIAGNOSIS — N1832 Chronic kidney disease, stage 3b: Secondary | ICD-10-CM

## 2022-09-13 DIAGNOSIS — D6481 Anemia due to antineoplastic chemotherapy: Secondary | ICD-10-CM | POA: Diagnosis not present

## 2022-09-13 DIAGNOSIS — N1831 Chronic kidney disease, stage 3a: Secondary | ICD-10-CM

## 2022-09-13 DIAGNOSIS — D701 Agranulocytosis secondary to cancer chemotherapy: Secondary | ICD-10-CM

## 2022-09-13 DIAGNOSIS — D631 Anemia in chronic kidney disease: Secondary | ICD-10-CM

## 2022-09-13 DIAGNOSIS — C221 Intrahepatic bile duct carcinoma: Secondary | ICD-10-CM

## 2022-09-13 DIAGNOSIS — C4359 Malignant melanoma of other part of trunk: Secondary | ICD-10-CM | POA: Diagnosis not present

## 2022-09-13 DIAGNOSIS — Z5111 Encounter for antineoplastic chemotherapy: Secondary | ICD-10-CM | POA: Diagnosis not present

## 2022-09-13 DIAGNOSIS — T451X5A Adverse effect of antineoplastic and immunosuppressive drugs, initial encounter: Secondary | ICD-10-CM

## 2022-09-13 LAB — CMP (CANCER CENTER ONLY)
ALT: 48 U/L — ABNORMAL HIGH (ref 0–44)
AST: 33 U/L (ref 15–41)
Albumin: 3.8 g/dL (ref 3.5–5.0)
Alkaline Phosphatase: 81 U/L (ref 38–126)
Anion gap: 7 (ref 5–15)
BUN: 21 mg/dL (ref 8–23)
CO2: 23 mmol/L (ref 22–32)
Calcium: 8.8 mg/dL — ABNORMAL LOW (ref 8.9–10.3)
Chloride: 108 mmol/L (ref 98–111)
Creatinine: 1.2 mg/dL (ref 0.61–1.24)
GFR, Estimated: >60 mL/min (ref >60)
Glucose, Bld: 143 mg/dL — ABNORMAL HIGH (ref 70–99)
Potassium: 3.7 mmol/L (ref 3.5–5.1)
Sodium: 138 mmol/L (ref 135–145)
Total Bilirubin: 0.5 mg/dL (ref 0.3–1.2)
Total Protein: 6.9 g/dL (ref 6.5–8.1)

## 2022-09-13 LAB — CBC WITH DIFFERENTIAL (CANCER CENTER ONLY)
Abs Immature Granulocytes: 0.08 10*3/uL — ABNORMAL HIGH (ref 0.00–0.07)
Basophils Absolute: 0 10*3/uL (ref 0.0–0.1)
Basophils Relative: 1 %
Eosinophils Absolute: 0.2 10*3/uL (ref 0.0–0.5)
Eosinophils Relative: 5 %
HCT: 33.5 % — ABNORMAL LOW (ref 39.0–52.0)
Hemoglobin: 11.3 g/dL — ABNORMAL LOW (ref 13.0–17.0)
Immature Granulocytes: 2 %
Lymphocytes Relative: 10 %
Lymphs Abs: 0.5 10*3/uL — ABNORMAL LOW (ref 0.7–4.0)
MCH: 32.5 pg (ref 26.0–34.0)
MCHC: 33.7 g/dL (ref 30.0–36.0)
MCV: 96.3 fL (ref 80.0–100.0)
Monocytes Absolute: 0.7 10*3/uL (ref 0.1–1.0)
Monocytes Relative: 13 %
Neutro Abs: 3.6 10*3/uL (ref 1.7–7.7)
Neutrophils Relative %: 69 %
Platelet Count: 189 10*3/uL (ref 150–400)
RBC: 3.48 MIL/uL — ABNORMAL LOW (ref 4.22–5.81)
RDW: 14.6 % (ref 11.5–15.5)
WBC Count: 5.1 10*3/uL (ref 4.0–10.5)
nRBC: 0 % (ref 0.0–0.2)

## 2022-09-13 LAB — SAMPLE TO BLOOD BANK

## 2022-09-13 MED ORDER — HEPARIN SOD (PORK) LOCK FLUSH 100 UNIT/ML IV SOLN
500.0000 [IU] | Freq: Once | INTRAVENOUS | Status: AC
  Administered 2022-09-13: 500 [IU] via INTRAVENOUS
  Filled 2022-09-13: qty 5

## 2022-09-13 MED ORDER — PROCHLORPERAZINE MALEATE 10 MG PO TABS
10.0000 mg | ORAL_TABLET | Freq: Once | ORAL | Status: AC
  Administered 2022-09-13: 10 mg via ORAL
  Filled 2022-09-13: qty 1

## 2022-09-13 MED ORDER — SODIUM CHLORIDE 0.9 % IV SOLN
900.0000 mg/m2 | Freq: Once | INTRAVENOUS | Status: AC
  Administered 2022-09-13: 2090 mg via INTRAVENOUS
  Filled 2022-09-13: qty 2.36

## 2022-09-13 MED ORDER — SODIUM CHLORIDE 0.9% FLUSH
10.0000 mL | INTRAVENOUS | Status: DC | PRN
  Filled 2022-09-13: qty 10

## 2022-09-13 MED ORDER — HEPARIN SOD (PORK) LOCK FLUSH 100 UNIT/ML IV SOLN
500.0000 [IU] | Freq: Once | INTRAVENOUS | Status: DC | PRN
  Filled 2022-09-13: qty 5

## 2022-09-13 MED ORDER — SODIUM CHLORIDE 0.9 % IV SOLN
Freq: Once | INTRAVENOUS | Status: AC
  Filled 2022-09-13: qty 250

## 2022-09-13 MED ORDER — SODIUM CHLORIDE 0.9% FLUSH
10.0000 mL | Freq: Once | INTRAVENOUS | Status: AC
  Administered 2022-09-13: 10 mL via INTRAVENOUS
  Filled 2022-09-13: qty 10

## 2022-09-13 NOTE — Progress Notes (Signed)
Hematology/Oncology Progress note [phone] [phone]    CHIEF COMPLAINTS/REASON FOR VISIT:  Follow up for melanoma, cholangiocarcinoma.    ASSESSMENT & PLAN:   Cancer Staging  Cholangiocarcinoma [facility]) Staging form: Intrahepatic Bile Duct, AJCC 8th Edition - Pathologic stage from 01/25/2022: Stage Unknown (pT1a, pNX, cM0) - Signed by Rickard Patience, MD on 02/21/2022  Malignant melanoma of overlapping sites [facility]) Staging form: Melanoma of the Skin, AJCC 8th Edition - Pathologic: Stage Unknown (rpTX, pN1b, cM0) - Signed by Rickard Patience, MD on 07/27/2020 - Pathologic: No stage assigned - Unsigned   Cholangiocarcinoma (HCC) T1a Nx Cholangiocarcinoma Pathology was reviewed and discussed with patient.  S/p chemotherapy Xeloda 825mg /m2 BID+ concurrent RT Previously on adjuvant Xeloda [1000 mg/m2 BID for 14 of every 21 days] -dose reduced to 825mg /m2 BID due to skin rash, still not able to tolerate- discontinue.  switched to gemcitabine weekly, 2 weeks on, 1 week off, [adjustment due to cytopenia] Labs are reviewed and discussed with patient.  proceed with cycle 4 D1 gemcitabine -dose reduce to 900mg /m2 1 week lab gemcitabine.      Malignant melanoma of overlapping sites [facility]) Patient has no radiographic evidence of metastatic melanoma on recent CT scan done at [facility] immunotherapy. Continue surveillance  Encounter for antineoplastic chemotherapy Chemotherapy plan as listed above.   Chemotherapy induced neutropenia (HCC) Monitor ANC closely.  Neutropenia precaution.   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  Hold ferrous sulfate 325mg  daily as it changes his stool color. Iron panel showed ferritin is above 200. Hemoglobin has improved  Stage 3a chronic kidney disease (HCC) Encourage oral hydration and avoid nephrotoxins.     Follow up per LOS  All questions were answered. The patient knows to call the clinic with any  problems, questions or concerns.  Rickard Patience, MD, PhD [facility] Hematology Oncology 09/13/2022    HISTORY OF PRESENTING ILLNESS:   Edward Pitts is a  62 y.o.  male presents for recurrent malignant melanoma.   Oncology History  Malignant melanoma of overlapping sites [facility])  04/16/2019 Cancer Staging   Staging form: Melanoma of the Skin, AJCC 8th Edition - Pathologic: Stage Unknown (rpTX, pN1b, cM0) - Signed by Rickard Patience, MD on 07/27/2020 Stage prefix: Recurrence    04/23/2019 Initial Diagnosis   Malignant melanoma   -He has a history of left lower extremity melanoma in 2011, status post local excision -04/16/2019 patient underwent left groin mass resection  Resection pathology showed malignant melanoma, replacing a lymph node, with extracapsular extension, peripheral and deep margins involved.  Left inguinal contents, all 7 lymph nodes were negative for melanoma in the lymph nodes. Extranodal melanoma identified in lymphatic and interstitium between nodes -PDL1 80% TPS    07/07/2019 -  Radiation Therapy   status post adjuvant radiation.   07/23/2019 - 07/21/2021 Chemotherapy   Nivolumab q14d      06/29/2020 Imaging   CT chest abdomen pelvis showed stable postoperative appearance of the left groin.  No evidence of local recurrence.  No evidence of metastatic disease in the chest abdomen or pelvis.  Hepatic steatosis.  Stable subcentimeter fluid attenuation lesion of the lateral right lobe of the liver, likely benign cyst or hemangioma.  Coronary artery disease.  Aortic atherosclerosis   10/20/2020 Imaging   CT chest abdomen pelvis showed stable postoperative/radiation appearance of the left groin.  No evidence of local recurrence/metastatic disease within the chest abdomen/pelvis.  Fatty liver disease.  Diverticulosis without evidence of typhlitis.  Aortic atherosclerosis   03/10/2021 Imaging   MRI brain without contrast showed no definitive evidence of intracranial metastatic disease.   Study is limited by absence of intravenous contrast.     04/26/2021 Imaging   CT chest abdomen pelvis without contrast showed stable post operative changes of left groin with no evidence of recurrent disease.  No evidence of metastatic disease in the chest abdomen pelvis.  Aortic atherosclerosis   08/08/2021 - 08/16/2021 Hospital Admission    patient was hospitalized due to NSTEMI status post CABG x3.  He also had pacemaker The echocardiogram showed left ventricular ejection fraction of 65 to 70%,   09/15/2021 Imaging   CT chest abdomen pelvis w contrast  IMPRESSION: 1. Subtle hypodense 9 mm lesion in the left lobe of the liver is new from prior imaging including previous contrasted CT dating back to December 10, 2019, with the lesion appearing to equilibrate with background liver on delayed imaging sequence but is incompletely evaluated on this imaging study and technically nonspecific possibly reflecting a benign perfusional variant and while its appearance is not typical for that of a melanoma metastasis, it is not excluded on this examination. Suggest more definitive characterization by hepatic protocol MRI with and without contrast. 2. Stable postoperative changes in the left groin without evidence of local recurrent disease.3. No evidence of metastatic disease in the chest or pelvis.4.  Aortic Atherosclerosis (ICD10-I70.0).      09/23/2021 Imaging   Contrast-enhanced liver ultrasound Mildly hypoenhancing 2.2 cm mass in the posterior aspect of the left lobe of the liver with washout characteristics concerning for non hepatocellular malignancy, concerning for melanotic metastasis given history. The lesion is in an unfavorable location for percutaneous biopsy. Consider PET-CT for further characterization   10/21/2021 -  Chemotherapy   Nivolumab q14d      11/10/2021 Imaging   MRI Brain w wo contrast Negative for metastatic disease to the brain   11/10/2021 Imaging   MRI abdomen w wo  contrast 1. Lesion of the posterior superior left lobe of the liver, hepatic segment II, measuring 1.9 x 1.8 cm corresponding to findings of prior imaging. Evaluation is somewhat limited breath motion artifact however there is subtle associated rim enhancement of this lesion. Findings are most in keeping with a hepatic metastasis in the setting of known recurrent melanoma. 2. Mild hepatic steatosis. 3. Cardiomegaly.    02/27/2022 - 02/27/2022 Chemotherapy   Patient is on Treatment Plan : Capecitabine (825 mg/m2 bid) + XRT     07/12/2022 -  Chemotherapy   Patient is on Treatment Plan : PANCREAS Gemcitabine D1,8(1000) q21d     Cholangiocarcinoma (HCC)  12/19/2021 Initial Diagnosis   Liver biopsy showed carcinoma  -The slides on the patient's prior left inguinal lymph node biopsy ([id number]) with metastatic melanoma were reviewed in conjunction with  this case. The morphology of the malignant cells between the two cases are dissimilar. A limited panel of immunohistochemical stains was performed and the neoplastic cells are positive for superpancytokeratin, cytokeratin 7 (diffuse, strong), cytokeratin 20 (patchy, moderate) and negative for S100, SOX-10, and HepPar-1.These findings are consistent with carcinoma. The morphologic findings and pattern of immunohistochemical staining are non-specific and possible sites of origin include but are not limited to pancreaticobiliary tract, GI tract, prostate, kidney, lung, and breast. Per [facility], the patient had a PET scan performed in July 2023 which demonstrated a single hepatic  hypermetabolic lesion without other sites of disease.    01/19/2022 Surgery   Liver lesion resection at [facility]  by Dr.Zani  A.  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  Tumor extends to hepatic parenchymal margin, but all other margins are negative for tumor. See synoptic report and comment.   Background liver  with: Mild steatosis (20%).  No definitive evidence of steatohepatitis.   Mild periportal fibrosis (trichrome and reticulin). No stainable iron (Prussian blue stain).  No evidence to support alpha-1-antitrypsin deficiency on PAS-D stain.   pT1a pNx   01/25/2022 Cancer Staging   Staging form: Intrahepatic Bile Duct, AJCC 8th Edition - Pathologic stage from 01/25/2022: Stage Unknown (pT1a, pNX, cM0) - Signed by Rickard Patience, MD on 02/21/2022 Stage prefix: Initial diagnosis   03/23/2022 - 05/01/2022 Radiation Therapy   Concurrent Xeloda 825mg /m2 BID + Radiation.    05/08/2022 -  Chemotherapy   Xeloda [1000 mg/m2 BID for 14 of every 21 days], plan 4 months.   05/08/2022, cycle 1 Xeloda.  Xeloda was stopped on 05/19/22 due to developing rash on face as well as dorsum of hands. Treated with steroid,symptoms resolved.  3/4-3/13/2024  Xeloda was stopped early due to groin cellulitis, he also developed similar skin rash again  Plan 3/25 cycle 3 dose reduce Xeloda 825 mg/m2   2000mg  BID x 14 days.   05/09/2022 Imaging   CT chest abdomen pelvis with contrast at [facility] showed Status post partial left hepatectomy without CT evidence of recurrent or metastatic disease in the chest, abdomen, pelvis.     07/24/2022 - 07/26/2022 Hospital Admission   Patient was hospitalized due to symptomatic anemia, status post 1 unit of PRBC transfusion. Also neutropenia secondary to chemotherapy.  Nadir was 0.9.  Patient was afebrile.    04/18/2021, patient establish care with neurology Dr. Barbaraann Cao for intermittent altered cognition.  he was recommended to start Vimpat.    INTERVAL HISTORY Jair Miske is a 62 y.o. male who has above history reviewed by me today presents for follow up visit for management of inguinal nodal recurrence of melanoma, resected T1a cholangiocarcinoma. Patient has felt fatigue and lightheaded last week with a hemoglobin of 8.8 and received 1 unit of PRBC transfusion.  He reports symptom has improved.   His mother was hospitalized at that time. He reports feeling well, denies any weakness or lightheaded. We discussed about possibility of anxiety playing a role causing his fatigue and lightheaded symptoms given the history patient reported that whenever his mother is sick in the hospital, he feels sick.     :Review of Systems  Constitutional:  Positive for fatigue. Negative for appetite change, chills, fever and unexpected weight change.  HENT:   Negative for hearing loss and voice change.   Eyes:  Negative for eye problems and icterus.  Respiratory:  Negative for chest tightness, cough and shortness of breath.   Cardiovascular:  Positive for leg swelling. Negative for chest pain.  Gastrointestinal:  Negative for abdominal distention and abdominal pain.  Endocrine: Negative for hot flashes.  Genitourinary:  Negative for difficulty urinating, dysuria and frequency.   Musculoskeletal:        Status post left hip replacement  Skin:  Negative for itching.       Skin hypo-pigmentation on upper extremities, no change   Neurological:  Negative for light-headedness and numbness.  Hematological:  Negative for adenopathy. Does not bruise/bleed easily.  Psychiatric/Behavioral:  Negative for confusion.     MEDICAL HISTORY:  Past Medical History:  Diagnosis Date   Anemia    iron treatments  Anxiety    Aortic atherosclerosis (HCC)    Arthritis    Cancer of groin (HCC) 2021   left groin, resected, radiation   Cataract    Complication of anesthesia    PONV   Coronary artery disease    Dizziness of unknown etiology    has led to seizures and passing out.   Family history of adverse reaction to anesthesia    PONV mother   GERD (gastroesophageal reflux disease)    History of complete heart block    PPM placed   Hyperlipidemia    Hypertension    LBBB (left bundle branch block)    Lymphedema of left leg    uses thigh high compression stockings   Melanoma (HCC) 2012   skin cancer,  left thigh   OSA on CPAP    PONV (postoperative nausea and vomiting) 04/16/2019   Port-A-Cath in place    RIGHT chest wall   Presence of cardiac pacemaker    [employer]   Seizures (HCC)    still has episodes of dizziness. last event 1 month ago (march 2022) and will pass out. takes clonazepam    SURGICAL HISTORY: Past Surgical History:  Procedure Laterality Date   CORONARY ARTERY BYPASS GRAFT N/A 08/10/2021   Procedure: CORONARY ARTERY BYPASS GRAFTING (CABG) X 3 USING LEFT INTERNAL MAMMARY ARTERY AND RIGHT GREATER SAPHENOUS VEIN;  Surgeon: Lovett Sox, MD;  Location: [facility] OR;  Service: Open Heart Surgery;  Laterality: N/A;   CT RADIATION THERAPY GUIDE     left groin   dental implant     permanent implant   ENDOVEIN HARVEST OF GREATER SAPHENOUS VEIN Right 08/10/2021   Procedure: ENDOVEIN HARVEST OF GREATER SAPHENOUS VEIN;  Surgeon: Lovett Sox, MD;  Location: [facility] OR;  Service: Open Heart Surgery;  Laterality: Right;   KNEE SURGERY Left    arthroscopy   LEFT HEART CATH AND CORONARY ANGIOGRAPHY Left 06/29/2017   Procedure: LEFT HEART CATH AND CORONARY ANGIOGRAPHY;  Surgeon: Lamar Blinks, MD;  Location: [facility] INVASIVE CV LAB;  Service: Cardiovascular;  Laterality: Left;   LEFT HEART CATH AND CORONARY ANGIOGRAPHY N/A 08/08/2021   Procedure: LEFT HEART CATH AND CORONARY ANGIOGRAPHY;  Surgeon: Lamar Blinks, MD;  Location: [facility] INVASIVE CV LAB;  Service: Cardiovascular;  Laterality: N/A;   LYMPH NODE DISSECTION Left 04/16/2019   Procedure: Left inguinal Lymph Node Dissection;  Surgeon: Almond Lint, MD;  Location: [facility] OR;  Service: General;  Laterality: Left;   MELANOMA EXCISION Left 04/16/2019   Procedure: MELANOMA EXCISION LEFT GROIN MASS;  Surgeon: Almond Lint, MD;  Location: [facility] OR;  Service: General;  Laterality: Left;   MELANOMA EXCISION WITH SENTINEL LYMPH NODE BIOPSY Left 2012   Left calf    PACEMAKER INSERTION N/A 08/26/2018   Procedure: INSERTION PACEMAKER;  Surgeon:  Marcina Millard, MD;  Location: [facility] ORS;  Service: Cardiovascular;  Laterality: N/A;   PORTA CATH INSERTION N/A 08/26/2019   Procedure: PORTA CATH INSERTION;  Surgeon: Renford Dills, MD;  Location: [facility] INVASIVE CV LAB;  Service: Cardiovascular;  Laterality: N/A;   SUPERFICIAL LYMPH NODE BIOPSY / EXCISION Left 2020   lymph nodes removed around left groin melanoma site   TEE WITHOUT CARDIOVERSION N/A 08/10/2021   Procedure: TRANSESOPHAGEAL ECHOCARDIOGRAM (TEE);  Surgeon: Lovett Sox, MD;  Location: [facility] OR;  Service: Open Heart Surgery;  Laterality: N/A;   TEMPORARY PACEMAKER N/A 08/25/2018   Procedure: TEMPORARY PACEMAKER;  Surgeon: Tonny Bollman, MD;  Location[facility] INVASIVE CV LAB;  Service:  Cardiovascular;  Laterality: N/A;   TOTAL HIP ARTHROPLASTY Left 07/14/2020   Procedure: TOTAL HIP ARTHROPLASTY;  Surgeon: Donato Heinz, MD;  Location: [facility] ORS;  Service: Orthopedics;  Laterality: Left;    SOCIAL HISTORY: Social History   Socioeconomic History   Marital status: Married    Spouse name: [name]    Number of children: 7   Years of education: 12   Highest education level: Not on file  Occupational History    Comment: disability  Tobacco Use   Smoking status: Never   Smokeless tobacco: Never  Vaping Use   Vaping Use: Never used  Substance and Sexual Activity   Alcohol use: No   Drug use: No   Sexual activity: Not Currently  Other Topics Concern   Not on file  Social History Narrative   Lives with  Wife,   Has 2 small dogs   Caffeine use: sodas (2 per day)      Out of work on disability.  Has a walk in shower. No stairs to climb   Oncology treatment ongoing. Uses port a cath for treatment.      pacemaker   Social Determinants of Health   Financial Resource Strain: Low Risk  (02/12/2019)   Overall Financial Resource Strain (CARDIA)    Difficulty of Paying Living Expenses: Not hard at all  Food Insecurity: Food Insecurity Present (02/21/2022)   Hunger Vital  Sign    Worried About Running Out of Food in the Last Year: Sometimes true    Ran Out of Food in the Last Year: Often true  Transportation Needs: No Transportation Needs (07/25/2022)   PRAPARE - Administrator, Civil Service (Medical): No    Lack of Transportation (Non-Medical): No  Physical Activity: Unknown (02/12/2019)   Exercise Vital Sign    Days of Exercise per Week: 0 days    Minutes of Exercise per Session: Not on file  Stress: No Stress Concern Present (02/12/2019)   [employer] of Occupational Health - Occupational Stress Questionnaire    Feeling of Stress : Only a little  Social Connections: Unknown (02/12/2019)   Social Connection and Isolation Panel [NHANES]    Frequency of Communication with Friends and Family: More than three times a week    Frequency of Social Gatherings with Friends and Family: Not on file    Attends Religious Services: Not on file    Active Member of Clubs or Organizations: Not on file    Attends Banker Meetings: Not on file    Marital Status: Married  Intimate Partner Violence: Not At Risk (07/25/2022)   Humiliation, Afraid, Rape, and Kick questionnaire    Fear of Current or Ex-Partner: No    Emotionally Abused: No    Physically Abused: No    Sexually Abused: No    FAMILY HISTORY: Family History  Problem Relation Age of Onset   Cancer Paternal Grandmother     ALLERGIES:  is allergic to ibuprofen, levetiracetam, nsaids, and capecitabine.  MEDICATIONS:  Current Outpatient Medications  Medication Sig Dispense Refill   ALPRAZolam (XANAX) 0.5 MG tablet Take 1 tablet (0.5 mg total) by mouth 2 (two) times daily as needed for anxiety. 30 tablet 0   apixaban (ELIQUIS) 5 MG TABS tablet Take 5 mg by mouth 2 (two) times daily.     aspirin 81 MG EC tablet Take 81 mg by mouth daily.     atorvastatin (LIPITOR) 20 MG tablet Take 20 mg by mouth daily.  Carboxymeth-Glyc-Polysorb PF (REFRESH OPTIVE MEGA-3) 0.5-1-0.5 % SOLN  Place 1 drop into both eyes daily as needed (dry eyes).     clonazePAM (KLONOPIN) 0.5 MG tablet TAKE 1 TABLET BY MOUTH IN THE MORNING AND 1 BY MOUTH IN THE EVENING per pt 120 tablet 1   clotrimazole (CLOTRIMAZOLE ANTI-FUNGAL) 1 % cream Apply 1 Application topically 2 (two) times daily. Apply to the L inguinal fold 30 g 0   hydrocortisone 2.5 % ointment Apply 1 application. topically daily as needed (itching).     Lacosamide 100 MG TABS Take 1 tablet (100 mg total) by mouth in the morning and at bedtime. 60 tablet 3   lidocaine-prilocaine (EMLA) cream Apply 1 Application topically as needed. Apply small amount of cream to port site approx 1-2 hours prior to appointment. 30 g 11   metoprolol succinate (TOPROL-XL) 25 MG 24 hr tablet Take 25 mg by mouth daily.     Multiple Vitamin (MULTIVITAMIN WITH MINERALS) TABS tablet Take 1 tablet by mouth daily. Centrum Silver     nystatin (MYCOSTATIN/NYSTOP) powder Apply 1 Application topically 3 (three) times daily. 60 g 1   omeprazole (PRILOSEC) 20 MG capsule TAKE 1 CAPSULE BY MOUTH EVERY DAY 90 capsule 0   ondansetron (ZOFRAN) 4 MG tablet TAKE 1 TABLET BY MOUTH EVERY 8 HOURS AS NEEDED FOR NAUSEA AND VOMITING 90 tablet 1   No current facility-administered medications for this visit.   Facility-Administered Medications Ordered in Other Visits  Medication Dose Route Frequency Provider Last Rate Last Admin   heparin lock flush 100 UNIT/ML injection            heparin lock flush 100 UNIT/ML injection            heparin lock flush 100 unit/mL  500 Units Intracatheter Once PRN Rickard Patience, MD       sodium chloride flush (NS) 0.9 % injection 10 mL  10 mL Intracatheter PRN Rickard Patience, MD         PHYSICAL EXAMINATION: ECOG PERFORMANCE STATUS: 1 - Symptomatic but completely ambulatory Vitals:   09/13/22 0839  BP: (!) 103/51  Pulse: 66  Resp: 18  Temp: (!) 97.4 F (36.3 C)   Filed Weights   09/13/22 0839  Weight: 254 lb 11.2 oz (115.5 kg)    Physical  Exam Constitutional:      General: He is not in acute distress.    Comments: Patient ambulates independently  HENT:     Head: Normocephalic and atraumatic.  Eyes:     General: No scleral icterus.    Pupils: Pupils are equal, round, and reactive to light.  Cardiovascular:     Rate and Rhythm: Normal rate and regular rhythm.     Heart sounds: Normal heart sounds.  Pulmonary:     Effort: Pulmonary effort is normal. No respiratory distress.     Breath sounds: No wheezing.  Abdominal:     General: Bowel sounds are normal. There is no distension.     Palpations: Abdomen is soft. There is no mass.     Tenderness: There is no abdominal tenderness.     Comments:    Musculoskeletal:        General: No deformity. Normal range of motion.     Cervical back: Normal range of motion and neck supple.     Comments: Left lower extremity edema-chronic   Skin:    General: Skin is warm and dry.     Comments: Bilateral dorsum had raised erythematous rash-improved  Left groin chronic edema with erythematous changes.-Improved with residual erythema  Neurological:     Mental Status: He is alert and oriented to person, place, and time. Mental status is at baseline.     Cranial Nerves: No cranial nerve deficit.     Coordination: Coordination normal.  Psychiatric:        Mood and Affect: Mood normal.     LABORATORY DATA:  I have reviewed the data as listed    Latest Ref Rng & Units 09/13/2022    8:27 AM 09/06/2022    9:20 AM 08/30/2022    9:15 AM  CBC  WBC 4.0 - 10.5 K/uL 5.1  4.5  1.7   Hemoglobin 13.0 - 17.0 g/dL 16.1  09.6  9.9   Hematocrit 39.0 - 52.0 % 33.5  33.0  29.5   Platelets 150 - 400 K/uL 189  126  224       Latest Ref Rng & Units 09/13/2022    8:27 AM 08/30/2022    9:15 AM 08/23/2022    8:44 AM  CMP  Glucose 70 - 99 mg/dL 045  409  811   BUN 8 - 23 mg/dL 21  18  21    Creatinine 0.61 - 1.24 mg/dL 9.14  7.82  9.56   Sodium 135 - 145 mmol/L 138  138  136   Potassium 3.5 - 5.1 mmol/L  3.7  4.1  3.9   Chloride 98 - 111 mmol/L 108  109  104   CO2 22 - 32 mmol/L 23  23  24    Calcium 8.9 - 10.3 mg/dL 8.8  8.9  8.8   Total Protein 6.5 - 8.1 g/dL 6.9  6.7  7.1   Total Bilirubin 0.3 - 1.2 mg/dL 0.5  0.4  0.7   Alkaline Phos 38 - 126 U/L 81  80  88   AST 15 - 41 U/L 33  45  38   ALT 0 - 44 U/L 48  67  45      RADIOGRAPHIC STUDIES: I have personally reviewed the radiological images as listed and agreed with the findings in the report. DG Chest Port 1 View  Result Date: 07/24/2022 CLINICAL DATA:  Dyspnea on exertion EXAM: PORTABLE CHEST 1 VIEW COMPARISON:  Chest x-ray 02/21/2022 FINDINGS: Right chest port catheter tip projects over the SVC. Left-sided pacemaker again noted. Sternotomy wires and mediastinal clips are present. The cardiomediastinal silhouette is within normal limits. The lungs are clear. There is no pleural effusion or pneumothorax. No acute fractures are seen. IMPRESSION: No active disease. Electronically Signed   By: Darliss Cheney M.D.   On: 07/24/2022 17:47

## 2022-09-13 NOTE — Assessment & Plan Note (Signed)
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  S/p chemotherapy Xeloda 825mg /m2 BID+ concurrent RT Previously on adjuvant Xeloda [1000 mg/m2 BID for 14 of every 21 days] -dose reduced to 825mg /m2 BID due to skin rash, still not able to tolerate- discontinue.  switched to gemcitabine weekly, 2 weeks on, 1 week off, [adjustment due to cytopenia] Labs are reviewed and discussed with patient.  proceed with cycle 4 D1 gemcitabine -dose reduce to 900mg /m2 1 week lab gemcitabine.

## 2022-09-13 NOTE — Assessment & Plan Note (Addendum)
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  Hold ferrous sulfate 325mg  daily as it changes his stool color. Iron panel showed ferritin is above 200. Hemoglobin has improved

## 2022-09-13 NOTE — Assessment & Plan Note (Signed)
Chemotherapy plan as listed above 

## 2022-09-13 NOTE — Patient Instructions (Signed)
[facility]  Discharge Instructions: Thank you for choosing [facility] to provide your oncology and hematology care.  If you have a lab appointment with the Cancer Center, please go directly to the Cancer Center and check in at the registration area.  Wear comfortable clothing and clothing appropriate for easy access to any Portacath or PICC line.   We strive to give you quality time with your provider. You may need to reschedule your appointment if you arrive late (15 or more minutes).  Arriving late affects you and other patients whose appointments are after yours.  Also, if you miss three or more appointments without notifying the office, you may be dismissed from the clinic at the provider's discretion.      For prescription refill requests, have your pharmacy contact our office and allow 72 hours for refills to be completed.    Today you received the following chemotherapy and/or immunotherapy agents Gemzar       To help prevent nausea and vomiting after your treatment, we encourage you to take your nausea medication as directed.  BELOW ARE SYMPTOMS THAT SHOULD BE REPORTED IMMEDIATELY: *FEVER GREATER THAN 100.4 F (38 C) OR HIGHER *CHILLS OR SWEATING *NAUSEA AND VOMITING THAT IS NOT CONTROLLED WITH YOUR NAUSEA MEDICATION *UNUSUAL SHORTNESS OF BREATH *UNUSUAL BRUISING OR BLEEDING *URINARY PROBLEMS (pain or burning when urinating, or frequent urination) *BOWEL PROBLEMS (unusual diarrhea, constipation, pain near the anus) TENDERNESS IN MOUTH AND THROAT WITH OR WITHOUT PRESENCE OF ULCERS (sore throat, sores in mouth, or a toothache) UNUSUAL RASH, SWELLING OR PAIN  UNUSUAL VAGINAL DISCHARGE OR ITCHING   Items with * indicate a potential emergency and should be followed up as soon as possible or go to the Emergency Department if any problems should occur.  Please show the CHEMOTHERAPY ALERT CARD or IMMUNOTHERAPY ALERT CARD at check-in to  the Emergency Department and triage nurse.  Should you have questions after your visit or need to cancel or reschedule your appointment, please contact [facility]  [phone] and follow the prompts.  Office hours are 8:00 a.m. to 4:30 p.m. Monday - Friday. Please note that voicemails left after 4:00 p.m. may not be returned until the following business day.  We are closed weekends and major holidays. You have access to a nurse at all times for urgent questions. Please call the main number to the clinic [phone] and follow the prompts.  For any non-urgent questions, you may also contact your provider using MyChart. We now offer e-Visits for anyone 18 and older to request care online for non-urgent symptoms. For details visit [email or web address].   Also download the MyChart app! Go to the app store, search "MyChart", open the app, select [facility], and log in with your MyChart username and password.    

## 2022-09-13 NOTE — Assessment & Plan Note (Signed)
Monitor ANC closely.  Neutropenia precaution.  

## 2022-09-13 NOTE — Assessment & Plan Note (Signed)
Patient has no radiographic evidence of metastatic melanoma on recent CT scan done at [facility] immunotherapy. Continue surveillance 

## 2022-09-13 NOTE — Assessment & Plan Note (Signed)
Encourage oral hydration and avoid nephrotoxins.   

## 2022-09-18 ENCOUNTER — Encounter: Payer: Self-pay | Admitting: Family

## 2022-09-18 ENCOUNTER — Ambulatory Visit (INDEPENDENT_AMBULATORY_CARE_PROVIDER_SITE_OTHER): Payer: 59 | Admitting: Family

## 2022-09-18 VITALS — BP 120/72 | HR 63 | Ht 67.0 in | Wt 256.4 lb

## 2022-09-18 DIAGNOSIS — I25118 Atherosclerotic heart disease of native coronary artery with other forms of angina pectoris: Secondary | ICD-10-CM | POA: Diagnosis not present

## 2022-09-18 DIAGNOSIS — Z659 Problem related to unspecified psychosocial circumstances: Secondary | ICD-10-CM | POA: Insufficient documentation

## 2022-09-18 DIAGNOSIS — Z7729 Contact with and (suspected ) exposure to other hazardous substances: Secondary | ICD-10-CM

## 2022-09-18 DIAGNOSIS — R7303 Prediabetes: Secondary | ICD-10-CM

## 2022-09-18 DIAGNOSIS — N1831 Chronic kidney disease, stage 3a: Secondary | ICD-10-CM

## 2022-09-18 DIAGNOSIS — D015 Carcinoma in situ of liver, gallbladder and bile ducts: Secondary | ICD-10-CM | POA: Insufficient documentation

## 2022-09-18 DIAGNOSIS — E782 Mixed hyperlipidemia: Secondary | ICD-10-CM

## 2022-09-18 DIAGNOSIS — E119 Type 2 diabetes mellitus without complications: Secondary | ICD-10-CM | POA: Insufficient documentation

## 2022-09-18 DIAGNOSIS — Z85820 Personal history of malignant melanoma of skin: Secondary | ICD-10-CM

## 2022-09-18 DIAGNOSIS — G40909 Epilepsy, unspecified, not intractable, without status epilepticus: Secondary | ICD-10-CM

## 2022-09-18 HISTORY — DX: Problem related to unspecified psychosocial circumstances: Z65.9

## 2022-09-18 HISTORY — DX: Contact with and (suspected) exposure to other hazardous substances: Z77.29

## 2022-09-18 HISTORY — DX: Personal history of malignant melanoma of skin: Z85.820

## 2022-09-18 NOTE — Assessment & Plan Note (Addendum)
Continue current therapy for lipid control. Will modify as needed based on labwork results.   

## 2022-09-18 NOTE — Assessment & Plan Note (Signed)
Patient stable.  Well controlled with current therapy.   Continue current meds.  

## 2022-09-18 NOTE — Assessment & Plan Note (Signed)
Continue current meds.  Will adjust as needed based on results.  The patient is asked to make an attempt to improve diet and exercise patterns to aid in medical management of this problem. Addressed importance of increasing and maintaining water intake.   

## 2022-09-18 NOTE — Assessment & Plan Note (Signed)
A1C Continues to be in prediabetic ranges.  Will reassess at follow up after next lab check.  Patient counseled on dietary choices and verbalized understanding.  Patient educated on foods that contain carbohydrates and the need to decrease intake.  We discussed prediabetes, and what it means and the need for strict dietary control to prevent progression to type 2 diabetes.  Advised to decrease intake of sugary drinks, including sodas, sweet tea, and some juices, and of starch and sugar heavy foods (ie., potatoes, rice, bread, pasta, desserts). He verbalizes understanding and agreement with the changes discussed today.  

## 2022-09-18 NOTE — Progress Notes (Signed)
Established Patient Office Visit  Subjective:  Patient ID: Edward Pitts, male    DOB: 04/27/1960  Age: 62 y.o. MRN: [id number]  Chief Complaint  Patient presents with   Follow-up    3 month follow up    Patient is here today for his 3 months follow up.  He has been feeling fairly well since last appointment.   He does not have additional concerns to discuss today.  Labs are due today,  but he will get these drawn at the oncology office. He needs refills.   I have reviewed his active problem list, medication list, allergies, notes from last encounter, lab results for his appointment today.    No other concerns at this time.   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   Family history of adverse reaction to anesthesia    PONV mother   GERD (gastroesophageal reflux disease)    H/O Malignant melanoma 09/18/2022   History of complete heart block    PPM placed   Hyperlipidemia    Hypertension    LBBB (left bundle branch block)    Lymphedema of left leg    uses thigh high compression stockings   Melanoma (HCC) 2012   skin cancer, left thigh   OSA on CPAP    PONV (postoperative nausea and vomiting) 04/16/2019   Port-A-Cath in place    RIGHT chest wall   Presence of cardiac pacemaker    [employer]   Problem related to unspecified psychosocial circumstances 09/18/2022   Seizures (HCC)    still has episodes of dizziness. last event 1 month ago (march 2022) and will pass out. takes clonazepam    Past Surgical History:  Procedure Laterality Date   CORONARY ARTERY BYPASS GRAFT N/A 08/10/2021   Procedure: CORONARY ARTERY BYPASS GRAFTING  (CABG) X 3 USING LEFT INTERNAL MAMMARY ARTERY AND RIGHT GREATER SAPHENOUS VEIN;  Surgeon: Lovett Sox, MD;  Location: [facility];  Service: Open Heart Surgery;  Laterality: N/A;   CT RADIATION THERAPY GUIDE     left groin   dental implant     permanent implant   ENDOVEIN HARVEST OF GREATER SAPHENOUS VEIN Right 08/10/2021   Procedure: ENDOVEIN HARVEST OF GREATER SAPHENOUS VEIN;  Surgeon: Lovett Sox, MD;  Location: [facility] OR;  Service: Open Heart Surgery;  Laterality: Right;   KNEE SURGERY Left    arthroscopy   LEFT HEART CATH AND CORONARY ANGIOGRAPHY Left 06/29/2017   Procedure: LEFT HEART CATH AND CORONARY ANGIOGRAPHY;  Surgeon: [place] Blinks, MD;  Location: [facility] INVASIVE CV LAB;  Service: Cardiovascular;  Laterality: Left;   LEFT HEART CATH AND CORONARY ANGIOGRAPHY N/A 08/08/2021   Procedure: LEFT HEART CATH AND CORONARY ANGIOGRAPHY;  Surgeon: [place] Blinks, MD;  Location: [facility] INVASIVE CV LAB;  Service: Cardiovascular;  Laterality: N/A;   LYMPH NODE DISSECTION Left 04/16/2019   Procedure: Left inguinal Lymph Node Dissection;  Surgeon: Almond Lint, MD;  Location: [facility] OR;  Service: General;  Laterality: Left;   MELANOMA EXCISION Left 04/16/2019   Procedure: MELANOMA EXCISION LEFT GROIN  MASS;  Surgeon: Almond Lint, MD;  Location: [facility] OR;  Service: General;  Laterality: Left;   MELANOMA EXCISION WITH SENTINEL LYMPH NODE BIOPSY Left 2012   Left calf    PACEMAKER INSERTION N/A 08/26/2018   Procedure: INSERTION PACEMAKER;  Surgeon: Marcina Millard, MD;  Location: [facility] ORS;  Service: Cardiovascular;  Laterality: N/A;   PORTA CATH INSERTION N/A 08/26/2019   Procedure: PORTA CATH INSERTION;  Surgeon: Renford Dills, MD;  Location: [facility] INVASIVE CV LAB;  Service: Cardiovascular;  Laterality: N/A;   SUPERFICIAL LYMPH NODE BIOPSY / EXCISION Left 2020   lymph nodes removed around left groin melanoma site   TEE WITHOUT CARDIOVERSION N/A 08/10/2021   Procedure: TRANSESOPHAGEAL ECHOCARDIOGRAM  (TEE);  Surgeon: Lovett Sox, MD;  Location: [facility] OR;  Service: Open Heart Surgery;  Laterality: N/A;   TEMPORARY PACEMAKER N/A 08/25/2018   Procedure: TEMPORARY PACEMAKER;  Surgeon: Tonny Bollman, MD;  Location: [facility] INVASIVE CV LAB;  Service: Cardiovascular;  Laterality: N/A;   TOTAL HIP ARTHROPLASTY Left 07/14/2020   Procedure: TOTAL HIP ARTHROPLASTY;  Surgeon: Donato Heinz, MD;  Location: [facility] ORS;  Service: Orthopedics;  Laterality: Left;    Social History   Socioeconomic History   Marital status: Married    Spouse name: [name]    Number of children: 7   Years of education: 12   Highest education level: Not on file  Occupational History    Comment: disability  Tobacco Use   Smoking status: Never   Smokeless tobacco: Never  Vaping Use   Vaping Use: Never used  Substance and Sexual Activity   Alcohol use: No   Drug use: No   Sexual activity: Not Currently  Other Topics Concern   Not on file  Social History Narrative   Lives with  Wife,   Has 2 small dogs   Caffeine use: sodas (2 per day)      Out of work on disability.  Has a walk in shower. No stairs to climb   Oncology treatment ongoing. Uses port a cath for treatment.      pacemaker   Social Determinants of Health   Financial Resource Strain: Low Risk  (02/12/2019)   Overall Financial Resource Strain (CARDIA)    Difficulty of Paying Living Expenses: Not hard at all  Food Insecurity: Food Insecurity Present (02/21/2022)   Hunger Vital Sign    Worried About Running Out of Food in the Last Year: Sometimes true    Ran Out of Food in the Last Year: Often true  Transportation Needs: No Transportation Needs (07/25/2022)   PRAPARE - Administrator, Civil Service (Medical): No    Lack of Transportation (Non-Medical): No  Physical Activity: Unknown (02/12/2019)   Exercise Vital Sign    Days of Exercise per Week: 0 days    Minutes of Exercise per Session: Not on file  Stress: No Stress Concern Present  (02/12/2019)   [employer] of Occupational Health - Occupational Stress Questionnaire    Feeling of Stress : Only a little  Social Connections: Unknown (02/12/2019)   Social Connection and Isolation Panel [NHANES]    Frequency of Communication with Friends and Family: More than three times a week    Frequency of Social Gatherings with Friends and Family: Not on file    Attends Religious Services: Not on file    Active Member of Clubs or Organizations: Not on file    Attends Banker Meetings: Not on file    Marital  Status: Married  Catering manager Violence: Not At Risk (07/25/2022)   Humiliation, Afraid, Rape, and Kick questionnaire    Fear of Current or Ex-Partner: No    Emotionally Abused: No    Physically Abused: No    Sexually Abused: No    Family History  Problem Relation Age of Onset   Cancer Paternal Grandmother     Allergies  Allergen Reactions   Ibuprofen Other (See Comments)    Affects kidneys   Levetiracetam Rash    Other reaction(s): Dizziness, Headache  Other Reaction(s): Dizziness, Headache, Other (see comments), Unknown  Dizziness & headaches   Nsaids     Affects kidneys   Capecitabine Rash    Other Reaction(s): Eruption of skin    Review of Systems  All other systems reviewed and are negative.      Objective:   BP 120/72   Pulse 63   Ht 5\' 7"  (1.702 m)   Wt 256 lb 6.4 oz (116.3 kg)   SpO2 95%   BMI 40.16 kg/m   Vitals:   09/18/22 0858  BP: 120/72  Pulse: 63  Height: 5\' 7"  (1.702 m)  Weight: 256 lb 6.4 oz (116.3 kg)  SpO2: 95%  BMI (Calculated): 40.15    Physical Exam Vitals and nursing note reviewed.  Constitutional:      Appearance: Normal appearance. He is normal weight.  Eyes:     Extraocular Movements: Extraocular movements intact.     Conjunctiva/sclera: Conjunctivae normal.     Pupils: Pupils are equal, round, and reactive to light.  Cardiovascular:     Rate and Rhythm: Normal rate and regular rhythm.      Pulses: Normal pulses.     Heart sounds: Normal heart sounds.  Pulmonary:     Effort: Pulmonary effort is normal.     Breath sounds: Normal breath sounds.  Neurological:     General: No focal deficit present.     Mental Status: He is alert and oriented to person, place, and time. Mental status is at baseline.  Psychiatric:        Mood and Affect: Mood normal.        Behavior: Behavior normal.        Thought Content: Thought content normal.        Judgment: Judgment normal.      No results found for any visits on 09/18/22.  Recent Results (from the past 2160 hour(s))  CMP (Cancer Center only)     Status: Abnormal   Collection Time: 07/10/22  9:42 AM  Result Value Ref Range   Sodium 138 135 - 145 mmol/L   Potassium 5.3 (H) 3.5 - 5.1 mmol/L   Chloride 111 98 - 111 mmol/L   CO2 22 22 - 32 mmol/L   Glucose, Bld 131 (H) 70 - 99 mg/dL    Comment: Glucose reference range applies only to samples taken after fasting for at least 8 hours.   BUN 35 (H) 8 - 23 mg/dL   Creatinine 6.21 (H) 3.08 - 1.24 mg/dL   Calcium 9.4 8.9 - 65.7 mg/dL   Total Protein 6.8 6.5 - 8.1 g/dL   Albumin 3.9 3.5 - 5.0 g/dL   AST 35 15 - 41 U/L   ALT 46 (H) 0 - 44 U/L   Alkaline Phosphatase 70 38 - 126 U/L   Total Bilirubin 0.7 0.3 - 1.2 mg/dL   GFR, Estimated 53 (L) >60 mL/min    Comment: (NOTE) Calculated using the CKD-EPI Creatinine Equation (  2021)    Anion gap 5 5 - 15    Comment: Performed at [facility], [address] [place], [place] [address]  CBC with Differential (Cancer Center Only)     Status: Abnormal   Collection Time: 07/10/22  9:42 AM  Result Value Ref Range   WBC Count 4.4 4.0 - 10.5 K/uL   RBC 2.70 (L) 4.22 - 5.81 MIL/uL   Hemoglobin 9.3 (L) 13.0 - 17.0 g/dL   HCT 60.4 (L) 54.0 - 98.1 %   MCV 103.7 (H) 80.0 - 100.0 fL   MCH 34.4 (H) 26.0 - 34.0 pg   MCHC 33.2 30.0 - 36.0 g/dL   RDW 19.1 (H) 47.8 - 29.5 %   Platelet Count 179 150 - 400 K/uL   nRBC 0.5 (H) 0.0 - 0.2 %    Neutrophils Relative % 71 %   Neutro Abs 3.1 1.7 - 7.7 K/uL   Lymphocytes Relative 8 %   Lymphs Abs 0.3 (L) 0.7 - 4.0 K/uL   Monocytes Relative 16 %   Monocytes Absolute 0.7 0.1 - 1.0 K/uL   Eosinophils Relative 3 %   Eosinophils Absolute 0.1 0.0 - 0.5 K/uL   Basophils Relative 1 %   Basophils Absolute 0.0 0.0 - 0.1 K/uL   Immature Granulocytes 1 %   Abs Immature Granulocytes 0.06 0.00 - 0.07 K/uL    Comment: Performed at [facility], [address] [place], [place] [address]  CMP (Cancer Center only)     Status: Abnormal   Collection Time: 07/19/22  9:03 AM  Result Value Ref Range   Sodium 135 135 - 145 mmol/L   Potassium 4.5 3.5 - 5.1 mmol/L   Chloride 108 98 - 111 mmol/L   CO2 19 (L) 22 - 32 mmol/L   Glucose, Bld 145 (H) 70 - 99 mg/dL    Comment: Glucose reference range applies only to samples taken after fasting for at least 8 hours.   BUN 36 (H) 8 - 23 mg/dL   Creatinine 8.65 (H) 7.84 - 1.24 mg/dL   Calcium 8.8 (L) 8.9 - 10.3 mg/dL   Total Protein 6.9 6.5 - 8.1 g/dL   Albumin 3.9 3.5 - 5.0 g/dL   AST 38 15 - 41 U/L   ALT 49 (H) 0 - 44 U/L   Alkaline Phosphatase 82 38 - 126 U/L   Total Bilirubin 0.5 0.3 - 1.2 mg/dL   GFR, Estimated 50 (L) >60 mL/min    Comment: (NOTE) Calculated using the CKD-EPI Creatinine Equation (2021)    Anion gap 8 5 - 15    Comment: Performed at [facility], [address] [place], [place] [address]  CBC with Differential (Cancer Center Only)     Status: Abnormal   Collection Time: 07/19/22  9:03 AM  Result Value Ref Range   WBC Count 2.4 (L) 4.0 - 10.5 K/uL   RBC 2.33 (L) 4.22 - 5.81 MIL/uL   Hemoglobin 8.2 (L) 13.0 - 17.0 g/dL   HCT 52.8 (L) 41.3 - 24.4 %   MCV 101.3 (H) 80.0 - 100.0 fL   MCH 35.2 (H) 26.0 - 34.0 pg   MCHC 34.7 30.0 - 36.0 g/dL   RDW 01.0 27.2 - 53.6 %   Platelet Count 112 (L) 150 - 400 K/uL   nRBC 3.3 (H) 0.0 - 0.2 %   Neutrophils Relative % 67 %   Neutro Abs 1.6 (L) 1.7 - 7.7 K/uL   Lymphocytes  Relative 12 %  Lymphs Abs 0.3 (L) 0.7 - 4.0 K/uL   Monocytes Relative 14 %   Monocytes Absolute 0.3 0.1 - 1.0 K/uL   Eosinophils Relative 1 %   Eosinophils Absolute 0.0 0.0 - 0.5 K/uL   Basophils Relative 1 %   Basophils Absolute 0.0 0.0 - 0.1 K/uL   Immature Granulocytes 5 %   Abs Immature Granulocytes 0.11 (H) 0.00 - 0.07 K/uL    Comment: Performed at [facility], [address] [place], [place] [address]  Retic Panel     Status: Abnormal   Collection Time: 07/19/22  9:03 AM  Result Value Ref Range   Retic Ct Pct 2.6 0.4 - 3.1 %   RBC. 2.36 (L) 4.22 - 5.81 MIL/uL   Retic Count, Absolute 61.1 19.0 - 186.0 K/uL   Immature Retic Fract 36.6 (H) 2.3 - 15.9 %   Reticulocyte Hemoglobin 35.1 >27.9 pg    Comment:        Given the high negative predictive value of a RET-He result > 32 pg iron deficiency is essentially excluded. If this patient is anemic other etiologies should be considered. Performed at [facility], [address] [place], [place] [address]   CBC     Status: Abnormal   Collection Time: 07/24/22 10:40 AM  Result Value Ref Range   WBC 2.1 (L) 4.0 - 10.5 K/uL   RBC 2.05 (L) 4.22 - 5.81 MIL/uL   Hemoglobin 7.0 (L) 13.0 - 17.0 g/dL   HCT 09.8 (L) 11.9 - 14.7 %   MCV 103.4 (H) 80.0 - 100.0 fL   MCH 34.1 (H) 26.0 - 34.0 pg   MCHC 33.0 30.0 - 36.0 g/dL   RDW 82.9 56.2 - 13.0 %   Platelets 102 (L) 150 - 400 K/uL    Comment: Immature Platelet Fraction may be clinically indicated, consider ordering this additional test [id number]    nRBC 0.0 0.0 - 0.2 %    Comment: Performed at [facility], [address], [place], [place] [address]  Basic metabolic panel     Status: Abnormal   Collection Time: 07/24/22 10:40 AM  Result Value Ref Range   Sodium 139 135 - 145 mmol/L   Potassium 5.4 (H) 3.5 - 5.1 mmol/L   Chloride 111 98 - 111 mmol/L   CO2 23 22 - 32 mmol/L   Glucose, Bld 115 (H) 70 - 99 mg/dL    Comment: Glucose reference range applies  only to samples taken after fasting for at least 8 hours.   BUN 36 (H) 8 - 23 mg/dL   Creatinine, Ser 8.41 (H) 0.61 - 1.24 mg/dL   Calcium 9.0 8.9 - 32.4 mg/dL   GFR, Estimated 48 (L) >60 mL/min    Comment: (NOTE) Calculated using the CKD-EPI Creatinine Equation (2021)    Anion gap 5 5 - 15    Comment: Performed at [facility], [address] [place], [place] [address]  Prepare RBC (crossmatch)     Status: None   Collection Time: 07/24/22  1:45 PM  Result Value Ref Range   Order Confirmation      ORDER PROCESSED BY BLOOD BANK Performed at [facility], [address] [place], [place] [address]   Type and screen [facility]     Status: None   Collection Time: 07/24/22  3:38 PM  Result Value Ref Range   ABO/RH(D) A POS    Antibody Screen NEG    Sample Expiration 07/27/2022,2359  Unit Number [id number]    Blood Component Type RBC LR PHER1    Unit division 00    Status of Unit ISSUED,FINAL    Transfusion Status OK TO TRANSFUSE    Crossmatch Result Compatible    Unit Number [id number]    Blood Component Type RBC LR PHER2    Unit division 00    Status of Unit ISSUED,FINAL    Transfusion Status OK TO TRANSFUSE    Crossmatch Result Compatible    Unit Number [id number]    Blood Component Type RBC LR PHER2    Unit division 00    Status of Unit ISSUED,FINAL    Transfusion Status OK TO TRANSFUSE    Crossmatch Result      Compatible Performed at [facility], [address] [place], [place] [address]   BPAM RBC     Status: None   Collection Time: 07/24/22  3:38 PM  Result Value Ref Range   ISSUE DATE / TIME 324401027253    Blood Product Unit Number [id number]    PRODUCT CODE G3875I43    Unit Type and Rh 0600    Blood Product Expiration Date 329518841660    ISSUE DATE / TIME 630160109323    Blood Product Unit Number [id number]    PRODUCT CODE [id number]    Unit Type and Rh 6200    Blood Product Expiration  Date 151761607371    ISSUE DATE / TIME 062694854627    Blood Product Unit Number [id number]    PRODUCT CODE [id number]    Unit Type and Rh 6200    Blood Product Expiration Date 938101751025   Basic metabolic panel     Status: Abnormal   Collection Time: 07/25/22  4:45 AM  Result Value Ref Range   Sodium 139 135 - 145 mmol/L   Potassium 5.0 3.5 - 5.1 mmol/L   Chloride 110 98 - 111 mmol/L   CO2 23 22 - 32 mmol/L   Glucose, Bld 109 (H) 70 - 99 mg/dL    Comment: Glucose reference range applies only to samples taken after fasting for at least 8 hours.   BUN 26 (H) 8 - 23 mg/dL   Creatinine, Ser 8.52 (H) 0.61 - 1.24 mg/dL   Calcium 8.8 (L) 8.9 - 10.3 mg/dL   GFR, Estimated >77 >82 mL/min    Comment: (NOTE) Calculated using the CKD-EPI Creatinine Equation (2021)    Anion gap 6 5 - 15    Comment: Performed at [facility], [address] [place], [place] [address]  CBC     Status: Abnormal   Collection Time: 07/25/22  4:45 AM  Result Value Ref Range   WBC 1.2 (LL) 4.0 - 10.5 K/uL    Comment: This critical result has verified and been called to [facility] by Arelia Longest on 04 30 2024 at 0536, and has been read back.    RBC 2.14 (L) 4.22 - 5.81 MIL/uL   Hemoglobin 7.2 (L) 13.0 - 17.0 g/dL   HCT 61.4 (L) 43.1 - 54.0 %   MCV 100.9 (H) 80.0 - 100.0 fL   MCH 33.6 26.0 - 34.0 pg   MCHC 33.3 30.0 - 36.0 g/dL   RDW 08.6 76.1 - 95.0 %   Platelets 74 (L) 150 - 400 K/uL    Comment: Immature Platelet Fraction may be clinically indicated, consider ordering this additional test [id number]    nRBC 0.0 0.0 - 0.2 %    Comment: Performed at [facility], [address], [place], [place] [address]  Technologist smear review     Status: None   Collection Time: 07/25/22  4:45 AM  Result Value Ref Range   WBC MORPHOLOGY MORPHOLOGY UNREMARKABLE    RBC MORPHOLOGY MORPHOLOGY UNREMARKABLE    Plt Morphology Normal platelet morphology    Clinical Information      anemia  neutropenia. please submit for pathologist smear    Comment: Performed at [facility], [address] [place], [place] [address]  CBC with Differential/Platelet     Status: Abnormal   Collection Time: 07/25/22  4:45 AM  Result Value Ref Range   WBC 1.2 (LL) 4.0 - 10.5 K/uL    Comment: CRITICAL VALUE NOTED.  VALUE IS CONSISTENT WITH PREVIOUSLY REPORTED AND CALLED VALUE. THIS CRITICAL RESULT HAS VERIFIED AND BEEN CALLED TO JESSICA MEDISON BY HINA PATEL ON 04 30 2024 AT 1129, AND HAS BEEN READ BACK.     RBC 2.20 (L) 4.22 - 5.81 MIL/uL   Hemoglobin 7.4 (L) 13.0 - 17.0 g/dL   HCT 95.2 (L) 84.1 - 32.4 %   MCV 101.4 (H) 80.0 - 100.0 fL   MCH 33.6 26.0 - 34.0 pg   MCHC 33.2 30.0 - 36.0 g/dL   RDW 40.1 02.7 - 25.3 %   Platelets 76 (L) 150 - 400 K/uL    Comment: Immature Platelet Fraction may be clinically indicated, consider ordering this additional test [id number]    nRBC 0.0 0.0 - 0.2 %   Neutrophils Relative % 75 %   Neutro Abs 0.9 (L) 1.7 - 7.7 K/uL   Lymphocytes Relative 19 %   Lymphs Abs 0.2 (L) 0.7 - 4.0 K/uL   Monocytes Relative 3 %   Monocytes Absolute 0.0 (L) 0.1 - 1.0 K/uL   Eosinophils Relative 2 %   Eosinophils Absolute 0.0 0.0 - 0.5 K/uL   Basophils Relative 0 %   Basophils Absolute 0.0 0.0 - 0.1 K/uL   WBC Morphology MORPHOLOGY UNREMARKABLE    RBC Morphology MORPHOLOGY UNREMARKABLE    Smear Review Normal platelet morphology    Immature Granulocytes 1 %   Abs Immature Granulocytes 0.01 0.00 - 0.07 K/uL    Comment: Performed at [facility], [address] [place], [place] [address]  Pathologist smear review     Status: None   Collection Time: 07/25/22  4:45 AM  Result Value Ref Range   Path Review Blood smear is reviewed.     Comment: Pancytopenia. Absolute leukopenia, with normal differential, and ANC of 900 per uL. No circulating blasts identified. Macrocytic anemia, with mild anisocytosis. Thrombocytopenia, with unremarkable platelet  morphology. The patient's history of malignancy is noted, and pancytopenia is likely secondary to recent chemotherapeutic intervention. Close clinical followup is recommended. Reviewed by Beryle Quant, M.D. Performed at [facility], [address] [place], [place] [address]   Hepatic function panel     Status: Abnormal   Collection Time: 07/25/22  4:45 AM  Result Value Ref Range   Total Protein 6.1 (L) 6.5 - 8.1 g/dL   Albumin 3.2 (L) 3.5 - 5.0 g/dL   AST 32 15 - 41 U/L   ALT 46 (H) 0 - 44 U/L   Alkaline Phosphatase 69 38 - 126 U/L   Total Bilirubin 1.1 0.3 - 1.2 mg/dL   Bilirubin, Direct 0.2 0.0 - 0.2 mg/dL   Indirect Bilirubin 0.9 0.3 - 0.9 mg/dL    Comment: Performed at [facility], [address] [place], [place]  [address]  Prepare RBC (crossmatch)     Status: None   Collection Time: 07/25/22  9:04 AM  Result Value Ref Range   Order Confirmation      ORDER PROCESSED BY BLOOD BANK Performed at [facility], [address] [place], [place] [address]   Hemoglobin and hematocrit, blood     Status: Abnormal   Collection Time: 07/25/22  1:51 PM  Result Value Ref Range   Hemoglobin 9.4 (L) 13.0 - 17.0 g/dL    Comment: REPEATED TO VERIFY   HCT 27.6 (L) 39.0 - 52.0 %    Comment: Performed at [facility], [address] [place], [place] [address]  CBC with Differential/Platelet     Status: Abnormal   Collection Time: 07/26/22  4:35 AM  Result Value Ref Range   WBC 1.5 (L) 4.0 - 10.5 K/uL   RBC 2.76 (L) 4.22 - 5.81 MIL/uL   Hemoglobin 9.2 (L) 13.0 - 17.0 g/dL   HCT 09.3 (L) 23.5 - 57.3 %   MCV 95.3 80.0 - 100.0 fL   MCH 33.3 26.0 - 34.0 pg   MCHC 35.0 30.0 - 36.0 g/dL   RDW 22.0 (H) 25.4 - 27.0 %   Platelets 55 (L) 150 - 400 K/uL    Comment: Immature Platelet Fraction may be clinically indicated, consider ordering this additional test [id number]    nRBC 1.4 (H) 0.0 - 0.2 %   Neutrophils Relative % 75 %   Neutro Abs 1.1 (L) 1.7  - 7.7 K/uL   Lymphocytes Relative 15 %   Lymphs Abs 0.2 (L) 0.7 - 4.0 K/uL   Monocytes Relative 7 %   Monocytes Absolute 0.1 0.1 - 1.0 K/uL   Eosinophils Relative 1 %   Eosinophils Absolute 0.0 0.0 - 0.5 K/uL   Basophils Relative 1 %   Basophils Absolute 0.0 0.0 - 0.1 K/uL   Immature Granulocytes 1 %   Abs Immature Granulocytes 0.01 0.00 - 0.07 K/uL    Comment: Performed at [facility], [address] [place], [place] [address]  CBC With Diff/Platelet     Status: Abnormal   Collection Time: 07/27/22  2:47 PM  Result Value Ref Range   WBC 2.4 (LL) 3.4 - 10.8 x10E3/uL   RBC 3.07 (L) 4.14 - 5.80 x10E6/uL   Hemoglobin 9.9 (L) 13.0 - 17.7 g/dL   Hematocrit 16.0 (L) 73.7 - 51.0 %   MCV 95 79 - 97 fL   MCH 32.2 26.6 - 33.0 pg   MCHC 34.0 31.5 - 35.7 g/dL   RDW 10.6 26.9 - 48.5 %   Platelets 62 (LL) 150 - 450 x10E3/uL    Comment: Actual platelet count may be somewhat higher than reported due to aggregation of platelets in this sample.    Neutrophils 73 Not Estab. %   Lymphs 12 Not Estab. %   Monocytes 11 Not Estab. %   Eos 3 Not Estab. %   Basos 0 Not Estab. %   Neutrophils Absolute 1.7 1.4 - 7.0 x10E3/uL   Lymphocytes Absolute 0.3 (L) 0.7 - 3.1 x10E3/uL   Monocytes Absolute 0.3 0.1 - 0.9 x10E3/uL   EOS (ABSOLUTE) 0.1 0.0 - 0.4 x10E3/uL   Basophils Absolute 0.0 0.0 - 0.2 x10E3/uL   Immature Granulocytes 1 Not Estab. %   Immature Grans (Abs) 0.0 0.0 - 0.1 x10E3/uL   NRBC 4 (H) 0 - 0 %   Hematology Comments: Note:     Comment: Verified by microscopic examination.  CMP (Cancer Center only)     Status: Abnormal   Collection Time: 08/02/22  7:59 AM  Result Value Ref Range   Sodium 137 135 - 145 mmol/L   Potassium 4.3 3.5 - 5.1 mmol/L   Chloride 104 98 - 111 mmol/L   CO2 26 22 - 32 mmol/L   Glucose, Bld 121 (H) 70 - 99 mg/dL    Comment: Glucose reference range applies only to samples taken after fasting for at least 8 hours.   BUN 21 8 - 23 mg/dL   Creatinine 1.61  (H) 0.61 - 1.24 mg/dL   Calcium 9.1 8.9 - 09.6 mg/dL   Total Protein 7.0 6.5 - 8.1 g/dL   Albumin 4.0 3.5 - 5.0 g/dL   AST 41 15 - 41 U/L   ALT 42 0 - 44 U/L   Alkaline Phosphatase 87 38 - 126 U/L   Total Bilirubin 0.6 0.3 - 1.2 mg/dL   GFR, Estimated >04 >54 mL/min    Comment: (NOTE) Calculated using the CKD-EPI Creatinine Equation (2021)    Anion gap 7 5 - 15    Comment: Performed at [facility], [address] [place], [place] [address]  CBC with Differential (Cancer Center Only)     Status: Abnormal   Collection Time: 08/02/22  7:59 AM  Result Value Ref Range   WBC Count 3.5 (L) 4.0 - 10.5 K/uL   RBC 3.28 (L) 4.22 - 5.81 MIL/uL   Hemoglobin 10.7 (L) 13.0 - 17.0 g/dL   HCT 91.4 (L) 78.2 - 95.6 %   MCV 97.9 80.0 - 100.0 fL   MCH 32.6 26.0 - 34.0 pg   MCHC 33.3 30.0 - 36.0 g/dL   RDW 21.3 (H) 08.6 - 57.8 %   Platelet Count 317 150 - 400 K/uL   nRBC 1.4 (H) 0.0 - 0.2 %   Neutrophils Relative % 71 %   Neutro Abs 2.5 1.7 - 7.7 K/uL   Lymphocytes Relative 8 %   Lymphs Abs 0.3 (L) 0.7 - 4.0 K/uL   Monocytes Relative 18 %   Monocytes Absolute 0.6 0.1 - 1.0 K/uL   Eosinophils Relative 1 %   Eosinophils Absolute 0.1 0.0 - 0.5 K/uL   Basophils Relative 1 %   Basophils Absolute 0.0 0.0 - 0.1 K/uL   Immature Granulocytes 1 %   Abs Immature Granulocytes 0.04 0.00 - 0.07 K/uL    Comment: Performed at [facility], [address] [place], [place] [address]  Sample to Blood Bank     Status: None   Collection Time: 08/02/22  7:59 AM  Result Value Ref Range   Blood Bank Specimen SAMPLE AVAILABLE FOR TESTING    Sample Expiration      08/05/2022,2359 Performed at [facility], [address] [place], [place] [address]   Iron and TIBC     Status: None   Collection Time: 08/02/22  7:59 AM  Result Value Ref Range   Iron 77 45 - 182 ug/dL   TIBC 132 440 - 102 ug/dL   Saturation Ratios 24 17.9 - 39.5 %   UIBC 251 ug/dL    Comment: Performed at [facility], [address] [place], [place] [address]  Ferritin     Status: None   Collection Time: 08/02/22  7:59 AM  Result Value Ref Range   Ferritin 277 24 - 336 ng/mL    Comment: Performed at [facility], [address] [place], [place] [address]  CMP (Cancer Center only)     Status: Abnormal   Collection Time: 08/09/22  8:30 AM  Result Value Ref Range   Sodium 138 135 - 145 mmol/L   Potassium 3.9 3.5 - 5.1 mmol/L   Chloride 106 98 - 111 mmol/L   CO2 23 22 - 32 mmol/L   Glucose, Bld 199 (H) 70 - 99 mg/dL    Comment: Glucose reference range applies only to samples taken after fasting for at least 8 hours.   BUN 18 8 - 23 mg/dL   Creatinine 0.10 (H) 2.72 - 1.24 mg/dL   Calcium 8.8 (L) 8.9 - 10.3 mg/dL   Total Protein 6.6 6.5 - 8.1 g/dL   Albumin 3.7 3.5 - 5.0 g/dL   AST 53 (H) 15 - 41 U/L   ALT 58 (H) 0 - 44 U/L   Alkaline Phosphatase 78 38 - 126 U/L   Total Bilirubin 0.5 0.3 - 1.2 mg/dL   GFR, Estimated >53 >66 mL/min    Comment: (NOTE) Calculated using the CKD-EPI Creatinine Equation (2021)    Anion gap 9 5 - 15    Comment: Performed at [facility], [address] [place], [place] [address]  CBC with Differential (Cancer Center Only)     Status: Abnormal   Collection Time: 08/09/22  8:30 AM  Result Value Ref Range   WBC Count 2.2 (L) 4.0 - 10.5 K/uL   RBC 2.85 (L) 4.22 - 5.81 MIL/uL   Hemoglobin 9.4 (L) 13.0 - 17.0 g/dL   HCT 74.2 (L) 59.5 - 63.8 %   MCV 98.9 80.0 - 100.0 fL   MCH 33.0 26.0 - 34.0 pg   MCHC 33.3 30.0 - 36.0 g/dL   RDW 75.6 (H) 43.3 - 29.5 %   Platelet Count 277 150 - 400 K/uL   nRBC 4.9 (H) 0.0 - 0.2 %   Neutrophils Relative % 51 %   Neutro Abs 1.1 (L) 1.7 - 7.7 K/uL   Lymphocytes Relative 15 %   Lymphs Abs 0.3 (L) 0.7 - 4.0 K/uL   Monocytes Relative 25 %   Monocytes Absolute 0.6 0.1 - 1.0 K/uL   Eosinophils Relative 1 %   Eosinophils Absolute 0.0 0.0 - 0.5 K/uL   Basophils Relative 1 %   Basophils Absolute 0.0 0.0 -  0.1 K/uL   WBC Morphology Mild Left Shift (1-5% metas, occ myelo)     Comment: DIFF. CONFIRMED BY SMEAR   RBC Morphology MORPHOLOGY UNREMARKABLE    Smear Review Normal platelet morphology     Comment: PLATELETS APPEAR ADEQUATE   Immature Granulocytes 7 %   Abs Immature Granulocytes 0.16 (H) 0.00 - 0.07 K/uL   Polychromasia PRESENT     Comment: Performed at [facility], [address] [place], [place] [address]  Hold Tube- Blood Bank     Status: None   Collection Time: 08/15/22  9:30 AM  Result Value Ref Range   Blood Bank Specimen SAMPLE AVAILABLE FOR TESTING    Sample Expiration      08/18/2022,2359 Performed at [facility], [address] [place], [place] [address]   CBC with Differential (Cancer Center Only)     Status: Abnormal   Collection Time: 08/15/22  9:30 AM  Result Value Ref Range   WBC Count 1.6 (L) 4.0 - 10.5 K/uL   RBC 2.66 (L) 4.22 - 5.81 MIL/uL   Hemoglobin 8.8 (L) 13.0 - 17.0 g/dL   HCT 01.6 (L) 01.0 - 93.2 %  MCV 97.0 80.0 - 100.0 fL   MCH 33.1 26.0 - 34.0 pg   MCHC 34.1 30.0 - 36.0 g/dL   RDW 40.9 (H) 81.1 - 91.4 %   Platelet Count 100 (L) 150 - 400 K/uL   nRBC 1.2 (H) 0.0 - 0.2 %   Neutrophils Relative % 60 %   Neutro Abs 1.0 (L) 1.7 - 7.7 K/uL   Lymphocytes Relative 16 %   Lymphs Abs 0.3 (L) 0.7 - 4.0 K/uL   Monocytes Relative 18 %   Monocytes Absolute 0.3 0.1 - 1.0 K/uL   Eosinophils Relative 1 %   Eosinophils Absolute 0.0 0.0 - 0.5 K/uL   Basophils Relative 1 %   Basophils Absolute 0.0 0.0 - 0.1 K/uL   Immature Granulocytes 4 %   Abs Immature Granulocytes 0.07 0.00 - 0.07 K/uL    Comment: Performed at [facility], [address] [place], [place] [address]  Type and screen     Status: None   Collection Time: 08/15/22  9:30 AM  Result Value Ref Range   ABO/RH(D) A POS    Antibody Screen NEG    Sample Expiration 08/18/2022,2359    Unit Number [id number]    Blood Component Type RED CELLS,LR    Unit division 00     Status of Unit ISSUED,FINAL    Transfusion Status OK TO TRANSFUSE    Crossmatch Result      Compatible Performed at [facility], [address] [place], [place] [address]   BPAM RBC     Status: None   Collection Time: 08/15/22  9:30 AM  Result Value Ref Range   ISSUE DATE / TIME [id number]    Blood Product Unit Number [id number]    PRODUCT CODE [id number]    Unit Type and Rh 6200    Blood Product Expiration Date [id number]   Prepare RBC (crossmatch)     Status: None   Collection Time: 08/16/22 12:30 PM  Result Value Ref Range   Order Confirmation      ORDER PROCESSED BY BLOOD BANK Performed at [facility], [address] [place], [place] [address]   Hold Tube- Blood Bank     Status: None   Collection Time: 08/23/22  8:44 AM  Result Value Ref Range   Blood Bank Specimen SAMPLE AVAILABLE FOR TESTING    Sample Expiration      08/26/2022,2359 Performed at [facility], [address] [place], [place] [address]   CMP (Cancer Center only)     Status: Abnormal   Collection Time: 08/23/22  8:44 AM  Result Value Ref Range   Sodium 136 135 - 145 mmol/L   Potassium 3.9 3.5 - 5.1 mmol/L   Chloride 104 98 - 111 mmol/L   CO2 24 22 - 32 mmol/L   Glucose, Bld 124 (H) 70 - 99 mg/dL    Comment: Glucose reference range applies only to samples taken after fasting for at least 8 hours.   BUN 21 8 - 23 mg/dL   Creatinine 0.93 2.35 - 1.24 mg/dL   Calcium 8.8 (L) 8.9 - 10.3 mg/dL   Total Protein 7.1 6.5 - 8.1 g/dL   Albumin 4.0 3.5 - 5.0 g/dL   AST 38 15 - 41 U/L   ALT 45 (H) 0 - 44 U/L   Alkaline Phosphatase 88 38 - 126 U/L   Total Bilirubin 0.7 0.3 - 1.2 mg/dL   GFR, Estimated >57 >32 mL/min    Comment: (NOTE)  Calculated using the CKD-EPI Creatinine Equation (2021)    Anion gap 8 5 - 15    Comment: Performed at [facility], [address] [place], [place] [address]  CBC with Differential (Cancer Center Only)     Status: Abnormal    Collection Time: 08/23/22  8:44 AM  Result Value Ref Range   WBC Count 5.4 4.0 - 10.5 K/uL   RBC 3.49 (L) 4.22 - 5.81 MIL/uL   Hemoglobin 11.4 (L) 13.0 - 17.0 g/dL   HCT 95.2 (L) 84.1 - 32.4 %   MCV 96.6 80.0 - 100.0 fL   MCH 32.7 26.0 - 34.0 pg   MCHC 33.8 30.0 - 36.0 g/dL   RDW 40.1 (H) 02.7 - 25.3 %   Platelet Count 226 150 - 400 K/uL   nRBC 0.0 0.0 - 0.2 %   Neutrophils Relative % 71 %   Neutro Abs 3.9 1.7 - 7.7 K/uL   Lymphocytes Relative 8 %   Lymphs Abs 0.4 (L) 0.7 - 4.0 K/uL   Monocytes Relative 14 %   Monocytes Absolute 0.8 0.1 - 1.0 K/uL   Eosinophils Relative 4 %   Eosinophils Absolute 0.2 0.0 - 0.5 K/uL   Basophils Relative 1 %   Basophils Absolute 0.0 0.0 - 0.1 K/uL   Immature Granulocytes 2 %   Abs Immature Granulocytes 0.09 (H) 0.00 - 0.07 K/uL    Comment: Performed at [facility], [address] [place], [place] [address]  CMP (Cancer Center only)     Status: Abnormal   Collection Time: 08/30/22  9:15 AM  Result Value Ref Range   Sodium 138 135 - 145 mmol/L   Potassium 4.1 3.5 - 5.1 mmol/L   Chloride 109 98 - 111 mmol/L   CO2 23 22 - 32 mmol/L   Glucose, Bld 117 (H) 70 - 99 mg/dL    Comment: Glucose reference range applies only to samples taken after fasting for at least 8 hours.   BUN 18 8 - 23 mg/dL   Creatinine 3.47 4.25 - 1.24 mg/dL   Calcium 8.9 8.9 - 95.6 mg/dL   Total Protein 6.7 6.5 - 8.1 g/dL   Albumin 3.7 3.5 - 5.0 g/dL   AST 45 (H) 15 - 41 U/L   ALT 67 (H) 0 - 44 U/L   Alkaline Phosphatase 80 38 - 126 U/L   Total Bilirubin 0.4 0.3 - 1.2 mg/dL   GFR, Estimated >38 >75 mL/min    Comment: (NOTE) Calculated using the CKD-EPI Creatinine Equation (2021)    Anion gap 6 5 - 15    Comment: Performed at [facility], [address] [place], [place] [address]  CBC with Differential (Cancer Center Only)     Status: Abnormal   Collection Time: 08/30/22  9:15 AM  Result Value Ref Range   WBC Count 1.7 (L) 4.0 - 10.5 K/uL   RBC 3.04  (L) 4.22 - 5.81 MIL/uL   Hemoglobin 9.9 (L) 13.0 - 17.0 g/dL   HCT 95.1 (L) 88.4 - 16.6 %   MCV 97.0 80.0 - 100.0 fL   MCH 32.6 26.0 - 34.0 pg   MCHC 33.6 30.0 - 36.0 g/dL   RDW 06.3 (H) 01.6 - 01.0 %   Platelet Count 224 150 - 400 K/uL   nRBC 3.6 (H) 0.0 - 0.2 %   Neutrophils Relative % 46 %   Neutro Abs 0.8 (L) 1.7 - 7.7 K/uL   Lymphocytes Relative 21 %  Lymphs Abs 0.3 (L) 0.7 - 4.0 K/uL   Monocytes Relative 25 %   Monocytes Absolute 0.4 0.1 - 1.0 K/uL   Eosinophils Relative 2 %   Eosinophils Absolute 0.0 0.0 - 0.5 K/uL   Basophils Relative 2 %   Basophils Absolute 0.0 0.0 - 0.1 K/uL   Immature Granulocytes 4 %   Abs Immature Granulocytes 0.07 0.00 - 0.07 K/uL    Comment: Performed at [facility], [address] [place], [place] [address]  Hold Tube- Blood Bank     Status: None   Collection Time: 09/06/22  9:20 AM  Result Value Ref Range   Blood Bank Specimen SAMPLE AVAILABLE FOR TESTING    Sample Expiration      09/09/2022,2359 Performed at [facility], [address] [place], [place] [address]   CBC with Differential (Cancer Center Only)     Status: Abnormal   Collection Time: 09/06/22  9:20 AM  Result Value Ref Range   WBC Count 4.5 4.0 - 10.5 K/uL   RBC 3.43 (L) 4.22 - 5.81 MIL/uL   Hemoglobin 11.3 (L) 13.0 - 17.0 g/dL   HCT 16.0 (L) 73.7 - 10.6 %   MCV 96.2 80.0 - 100.0 fL   MCH 32.9 26.0 - 34.0 pg   MCHC 34.2 30.0 - 36.0 g/dL   RDW 26.9 48.5 - 46.2 %   Platelet Count 126 (L) 150 - 400 K/uL   nRBC 0.7 (H) 0.0 - 0.2 %   Neutrophils Relative % 67 %   Neutro Abs 3.0 1.7 - 7.7 K/uL   Lymphocytes Relative 9 %   Lymphs Abs 0.4 (L) 0.7 - 4.0 K/uL   Monocytes Relative 18 %   Monocytes Absolute 0.8 0.1 - 1.0 K/uL   Eosinophils Relative 3 %   Eosinophils Absolute 0.1 0.0 - 0.5 K/uL   Basophils Relative 1 %   Basophils Absolute 0.0 0.0 - 0.1 K/uL   Immature Granulocytes 2 %   Abs Immature Granulocytes 0.08 (H) 0.00 - 0.07 K/uL    Comment:  Performed at [facility], [address] [place], [place] [address]  Sample to Blood Bank     Status: None   Collection Time: 09/13/22  8:27 AM  Result Value Ref Range   Blood Bank Specimen SAMPLE AVAILABLE FOR TESTING    Sample Expiration      09/16/2022,2359 Performed at [facility], [address] [place], [place] [address]   CMP (Cancer Center only)     Status: Abnormal   Collection Time: 09/13/22  8:27 AM  Result Value Ref Range   Sodium 138 135 - 145 mmol/L   Potassium 3.7 3.5 - 5.1 mmol/L   Chloride 108 98 - 111 mmol/L   CO2 23 22 - 32 mmol/L   Glucose, Bld 143 (H) 70 - 99 mg/dL    Comment: Glucose reference range applies only to samples taken after fasting for at least 8 hours.   BUN 21 8 - 23 mg/dL   Creatinine 8.29 9.37 - 1.24 mg/dL   Calcium 8.8 (L) 8.9 - 10.3 mg/dL   Total Protein 6.9 6.5 - 8.1 g/dL   Albumin 3.8 3.5 - 5.0 g/dL   AST 33 15 - 41 U/L   ALT 48 (H) 0 - 44 U/L   Alkaline Phosphatase 81 38 - 126 U/L   Total Bilirubin 0.5 0.3 - 1.2 mg/dL   GFR, Estimated >16 >96 mL/min    Comment: (NOTE) Calculated using the  CKD-EPI Creatinine Equation (2021)    Anion gap 7 5 - 15    Comment: Performed at [facility], [address] [place], [place] [address]  CBC with Differential (Cancer Center Only)     Status: Abnormal   Collection Time: 09/13/22  8:27 AM  Result Value Ref Range   WBC Count 5.1 4.0 - 10.5 K/uL   RBC 3.48 (L) 4.22 - 5.81 MIL/uL   Hemoglobin 11.3 (L) 13.0 - 17.0 g/dL   HCT 65.7 (L) 84.6 - 96.2 %   MCV 96.3 80.0 - 100.0 fL   MCH 32.5 26.0 - 34.0 pg   MCHC 33.7 30.0 - 36.0 g/dL   RDW 95.2 84.1 - 32.4 %   Platelet Count 189 150 - 400 K/uL   nRBC 0.0 0.0 - 0.2 %   Neutrophils Relative % 69 %   Neutro Abs 3.6 1.7 - 7.7 K/uL   Lymphocytes Relative 10 %   Lymphs Abs 0.5 (L) 0.7 - 4.0 K/uL   Monocytes Relative 13 %   Monocytes Absolute 0.7 0.1 - 1.0 K/uL   Eosinophils Relative 5 %   Eosinophils Absolute 0.2 0.0 - 0.5  K/uL   Basophils Relative 1 %   Basophils Absolute 0.0 0.0 - 0.1 K/uL   Immature Granulocytes 2 %   Abs Immature Granulocytes 0.08 (H) 0.00 - 0.07 K/uL    Comment: Performed at [facility], [address] [place], [place] [address]       Assessment & Plan:   Problem List Items Addressed This Visit       Active Problems   HLD (hyperlipidemia) - Primary    Continue current therapy for lipid control. Will modify as needed based on labwork results.       Relevant Medications   atorvastatin (LIPITOR) 40 MG tablet   Seizure disorder (HCC)    Patient stable.  Well controlled with current therapy.   Continue current meds.        Stage 3a chronic kidney disease (HCC) (Chronic)    Patient stable.  Well controlled with current therapy.   Continue current meds.        Obesity, Class III, BMI 40-49.9 (morbid obesity) (HCC)    Continue current meds.  Will adjust as needed based on results.  The patient is asked to make an attempt to improve diet and exercise patterns to aid in medical management of this problem. Addressed importance of increasing and maintaining water intake.       Atherosclerotic heart disease of native coronary artery with other forms of angina pectoris (HCC)   Relevant Medications   atorvastatin (LIPITOR) 40 MG tablet   Prediabetes    A1C Continues to be in prediabetic ranges.  Will reassess at follow up after next lab check.  Patient counseled on dietary choices and verbalized understanding.   Patient educated on foods that contain carbohydrates and the need to decrease intake.  We discussed prediabetes, and what it means and the need for strict dietary control to prevent progression to type 2 diabetes.  Advised to decrease intake of sugary drinks, including sodas, sweet tea, and some juices, and of starch and sugar heavy foods (ie., potatoes, rice, bread, pasta, desserts). He verbalizes understanding and agreement with the changes discussed  today.         Return in about 6 months (around 03/20/2023).   Total time spent: 30 minutes  Miki Kins, FNP  09/18/2022   This document may have been  prepared by [employer] and as such may include unintentional dictation errors.

## 2022-09-19 ENCOUNTER — Other Ambulatory Visit: Payer: Self-pay

## 2022-09-20 ENCOUNTER — Ambulatory Visit: Payer: 59 | Admitting: Oncology

## 2022-09-20 ENCOUNTER — Inpatient Hospital Stay: Payer: 59

## 2022-09-20 ENCOUNTER — Other Ambulatory Visit: Payer: 59

## 2022-09-20 ENCOUNTER — Ambulatory Visit: Payer: 59

## 2022-09-20 VITALS — BP 135/67 | HR 62 | Temp 99.1°F | Resp 18

## 2022-09-20 DIAGNOSIS — Z5111 Encounter for antineoplastic chemotherapy: Secondary | ICD-10-CM | POA: Diagnosis not present

## 2022-09-20 DIAGNOSIS — C438 Malignant melanoma of overlapping sites of skin: Secondary | ICD-10-CM

## 2022-09-20 DIAGNOSIS — C221 Intrahepatic bile duct carcinoma: Secondary | ICD-10-CM | POA: Diagnosis not present

## 2022-09-20 DIAGNOSIS — D6481 Anemia due to antineoplastic chemotherapy: Secondary | ICD-10-CM | POA: Diagnosis not present

## 2022-09-20 DIAGNOSIS — Z452 Encounter for adjustment and management of vascular access device: Secondary | ICD-10-CM | POA: Diagnosis not present

## 2022-09-20 DIAGNOSIS — N1831 Chronic kidney disease, stage 3a: Secondary | ICD-10-CM | POA: Diagnosis not present

## 2022-09-20 DIAGNOSIS — C4359 Malignant melanoma of other part of trunk: Secondary | ICD-10-CM | POA: Diagnosis not present

## 2022-09-20 LAB — CBC WITH DIFFERENTIAL (CANCER CENTER ONLY)
Abs Immature Granulocytes: 0.07 10*3/uL (ref 0.00–0.07)
Basophils Absolute: 0 10*3/uL (ref 0.0–0.1)
Basophils Relative: 1 %
Eosinophils Absolute: 0.1 10*3/uL (ref 0.0–0.5)
Eosinophils Relative: 4 %
HCT: 31.1 % — ABNORMAL LOW (ref 39.0–52.0)
Hemoglobin: 10.6 g/dL — ABNORMAL LOW (ref 13.0–17.0)
Immature Granulocytes: 3 %
Lymphocytes Relative: 21 %
Lymphs Abs: 0.4 10*3/uL — ABNORMAL LOW (ref 0.7–4.0)
MCH: 32.3 pg (ref 26.0–34.0)
MCHC: 34.1 g/dL (ref 30.0–36.0)
MCV: 94.8 fL (ref 80.0–100.0)
Monocytes Absolute: 0.4 10*3/uL (ref 0.1–1.0)
Monocytes Relative: 19 %
Neutro Abs: 1.1 10*3/uL — ABNORMAL LOW (ref 1.7–7.7)
Neutrophils Relative %: 52 %
Platelet Count: 133 10*3/uL — ABNORMAL LOW (ref 150–400)
RBC: 3.28 MIL/uL — ABNORMAL LOW (ref 4.22–5.81)
RDW: 13.6 % (ref 11.5–15.5)
WBC Count: 2.1 10*3/uL — ABNORMAL LOW (ref 4.0–10.5)
nRBC: 2.8 % — ABNORMAL HIGH (ref 0.0–0.2)

## 2022-09-20 LAB — CMP (CANCER CENTER ONLY)
ALT: 68 U/L — ABNORMAL HIGH (ref 0–44)
AST: 43 U/L — ABNORMAL HIGH (ref 15–41)
Albumin: 3.8 g/dL (ref 3.5–5.0)
Alkaline Phosphatase: 80 U/L (ref 38–126)
Anion gap: 6 (ref 5–15)
BUN: 13 mg/dL (ref 8–23)
CO2: 26 mmol/L (ref 22–32)
Calcium: 9 mg/dL (ref 8.9–10.3)
Chloride: 107 mmol/L (ref 98–111)
Creatinine: 0.99 mg/dL (ref 0.61–1.24)
GFR, Estimated: >60 mL/min (ref >60)
Glucose, Bld: 133 mg/dL — ABNORMAL HIGH (ref 70–99)
Potassium: 3.9 mmol/L (ref 3.5–5.1)
Sodium: 139 mmol/L (ref 135–145)
Total Bilirubin: 0.6 mg/dL (ref 0.3–1.2)
Total Protein: 6.7 g/dL (ref 6.5–8.1)

## 2022-09-20 MED ORDER — PROCHLORPERAZINE MALEATE 10 MG PO TABS
10.0000 mg | ORAL_TABLET | Freq: Once | ORAL | Status: AC
  Administered 2022-09-20: 10 mg via ORAL
  Filled 2022-09-20: qty 1

## 2022-09-20 MED ORDER — SODIUM CHLORIDE 0.9% FLUSH
10.0000 mL | INTRAVENOUS | Status: DC | PRN
  Administered 2022-09-20: 10 mL
  Filled 2022-09-20: qty 10

## 2022-09-20 MED ORDER — SODIUM CHLORIDE 0.9 % IV SOLN
Freq: Once | INTRAVENOUS | Status: AC
  Filled 2022-09-20: qty 250

## 2022-09-20 MED ORDER — HEPARIN SOD (PORK) LOCK FLUSH 100 UNIT/ML IV SOLN
500.0000 [IU] | Freq: Once | INTRAVENOUS | Status: AC | PRN
  Administered 2022-09-20: 500 [IU]
  Filled 2022-09-20: qty 5

## 2022-09-20 MED ORDER — SODIUM CHLORIDE 0.9 % IV SOLN
900.0000 mg/m2 | Freq: Once | INTRAVENOUS | Status: AC
  Administered 2022-09-20: 2090 mg via INTRAVENOUS
  Filled 2022-09-20: qty 54.97

## 2022-09-20 NOTE — Patient Instructions (Signed)
[facility]  Discharge Instructions: Thank you for choosing [facility] to provide your oncology and hematology care.  If you have a lab appointment with the Cancer Center, please go directly to the Cancer Center and check in at the registration area.  Wear comfortable clothing and clothing appropriate for easy access to any Portacath or PICC line.   We strive to give you quality time with your provider. You may need to reschedule your appointment if you arrive late (15 or more minutes).  Arriving late affects you and other patients whose appointments are after yours.  Also, if you miss three or more appointments without notifying the office, you may be dismissed from the clinic at the provider's discretion.      For prescription refill requests, have your pharmacy contact our office and allow 72 hours for refills to be completed.     To help prevent nausea and vomiting after your treatment, we encourage you to take your nausea medication as directed.  BELOW ARE SYMPTOMS THAT SHOULD BE REPORTED IMMEDIATELY: *FEVER GREATER THAN 100.4 F (38 C) OR HIGHER *CHILLS OR SWEATING *NAUSEA AND VOMITING THAT IS NOT CONTROLLED WITH YOUR NAUSEA MEDICATION *UNUSUAL SHORTNESS OF BREATH *UNUSUAL BRUISING OR BLEEDING *URINARY PROBLEMS (pain or burning when urinating, or frequent urination) *BOWEL PROBLEMS (unusual diarrhea, constipation, pain near the anus) TENDERNESS IN MOUTH AND THROAT WITH OR WITHOUT PRESENCE OF ULCERS (sore throat, sores in mouth, or a toothache) UNUSUAL RASH, SWELLING OR PAIN  UNUSUAL VAGINAL DISCHARGE OR ITCHING   Items with * indicate a potential emergency and should be followed up as soon as possible or go to the Emergency Department if any problems should occur.  Please show the CHEMOTHERAPY ALERT CARD or IMMUNOTHERAPY ALERT CARD at check-in to the Emergency Department and triage nurse.  Should you have questions after your visit  or need to cancel or reschedule your appointment, please contact [facility]  [phone] and follow the prompts.  Office hours are 8:00 a.m. to 4:30 p.m. Monday - Friday. Please note that voicemails left after 4:00 p.m. may not be returned until the following business day.  We are closed weekends and major holidays. You have access to a nurse at all times for urgent questions. Please call the main number to the clinic [phone] and follow the prompts.  For any non-urgent questions, you may also contact your provider using MyChart. We now offer e-Visits for anyone 18 and older to request care online for non-urgent symptoms. For details visit [email or web address].   Also download the MyChart app! Go to the app store, search "MyChart", open the app, select [place], and log in with your MyChart username and password.    

## 2022-09-27 ENCOUNTER — Other Ambulatory Visit: Payer: 59

## 2022-09-27 ENCOUNTER — Ambulatory Visit: Payer: 59

## 2022-10-03 DIAGNOSIS — D631 Anemia in chronic kidney disease: Secondary | ICD-10-CM | POA: Diagnosis not present

## 2022-10-03 DIAGNOSIS — E875 Hyperkalemia: Secondary | ICD-10-CM | POA: Diagnosis not present

## 2022-10-03 DIAGNOSIS — N1831 Chronic kidney disease, stage 3a: Secondary | ICD-10-CM | POA: Diagnosis not present

## 2022-10-03 DIAGNOSIS — I129 Hypertensive chronic kidney disease with stage 1 through stage 4 chronic kidney disease, or unspecified chronic kidney disease: Secondary | ICD-10-CM | POA: Diagnosis not present

## 2022-10-04 ENCOUNTER — Other Ambulatory Visit: Payer: Self-pay

## 2022-10-04 ENCOUNTER — Inpatient Hospital Stay: Payer: 59

## 2022-10-04 ENCOUNTER — Encounter: Payer: Self-pay | Admitting: Oncology

## 2022-10-04 ENCOUNTER — Inpatient Hospital Stay (HOSPITAL_BASED_OUTPATIENT_CLINIC_OR_DEPARTMENT_OTHER): Payer: 59 | Admitting: Oncology

## 2022-10-04 ENCOUNTER — Inpatient Hospital Stay: Payer: 59 | Attending: Oncology

## 2022-10-04 VITALS — BP 132/64 | HR 63 | Temp 97.7°F | Resp 16 | Wt 257.0 lb

## 2022-10-04 DIAGNOSIS — T451X5A Adverse effect of antineoplastic and immunosuppressive drugs, initial encounter: Secondary | ICD-10-CM

## 2022-10-04 DIAGNOSIS — N1831 Chronic kidney disease, stage 3a: Secondary | ICD-10-CM

## 2022-10-04 DIAGNOSIS — N1832 Chronic kidney disease, stage 3b: Secondary | ICD-10-CM

## 2022-10-04 DIAGNOSIS — F419 Anxiety disorder, unspecified: Secondary | ICD-10-CM | POA: Insufficient documentation

## 2022-10-04 DIAGNOSIS — C221 Intrahepatic bile duct carcinoma: Secondary | ICD-10-CM

## 2022-10-04 DIAGNOSIS — C438 Malignant melanoma of overlapping sites of skin: Secondary | ICD-10-CM

## 2022-10-04 DIAGNOSIS — D701 Agranulocytosis secondary to cancer chemotherapy: Secondary | ICD-10-CM | POA: Insufficient documentation

## 2022-10-04 DIAGNOSIS — D631 Anemia in chronic kidney disease: Secondary | ICD-10-CM | POA: Insufficient documentation

## 2022-10-04 DIAGNOSIS — Z85820 Personal history of malignant melanoma of skin: Secondary | ICD-10-CM | POA: Insufficient documentation

## 2022-10-04 DIAGNOSIS — Z5111 Encounter for antineoplastic chemotherapy: Secondary | ICD-10-CM

## 2022-10-04 LAB — CMP (CANCER CENTER ONLY)
ALT: 89 U/L — ABNORMAL HIGH (ref 0–44)
AST: 50 U/L — ABNORMAL HIGH (ref 15–41)
Albumin: 3.9 g/dL (ref 3.5–5.0)
Alkaline Phosphatase: 81 U/L (ref 38–126)
Anion gap: 9 (ref 5–15)
BUN: 19 mg/dL (ref 8–23)
CO2: 22 mmol/L (ref 22–32)
Calcium: 8.9 mg/dL (ref 8.9–10.3)
Chloride: 107 mmol/L (ref 98–111)
Creatinine: 1.07 mg/dL (ref 0.61–1.24)
GFR, Estimated: >60 mL/min (ref >60)
Glucose, Bld: 139 mg/dL — ABNORMAL HIGH (ref 70–99)
Potassium: 3.9 mmol/L (ref 3.5–5.1)
Sodium: 138 mmol/L (ref 135–145)
Total Bilirubin: 0.4 mg/dL (ref 0.3–1.2)
Total Protein: 6.7 g/dL (ref 6.5–8.1)

## 2022-10-04 LAB — IRON AND TIBC
Iron: 65 ug/dL (ref 45–182)
Saturation Ratios: 21 % (ref 17.9–39.5)
TIBC: 307 ug/dL (ref 250–450)
UIBC: 242 ug/dL

## 2022-10-04 LAB — CBC WITH DIFFERENTIAL (CANCER CENTER ONLY)
Abs Immature Granulocytes: 0.03 10*3/uL (ref 0.00–0.07)
Basophils Absolute: 0 10*3/uL (ref 0.0–0.1)
Basophils Relative: 1 %
Eosinophils Absolute: 0.1 10*3/uL (ref 0.0–0.5)
Eosinophils Relative: 2 %
HCT: 31.4 % — ABNORMAL LOW (ref 39.0–52.0)
Hemoglobin: 10.6 g/dL — ABNORMAL LOW (ref 13.0–17.0)
Immature Granulocytes: 1 %
Lymphocytes Relative: 11 %
Lymphs Abs: 0.4 10*3/uL — ABNORMAL LOW (ref 0.7–4.0)
MCH: 32.5 pg (ref 26.0–34.0)
MCHC: 33.8 g/dL (ref 30.0–36.0)
MCV: 96.3 fL (ref 80.0–100.0)
Monocytes Absolute: 0.5 10*3/uL (ref 0.1–1.0)
Monocytes Relative: 14 %
Neutro Abs: 2.5 10*3/uL (ref 1.7–7.7)
Neutrophils Relative %: 71 %
Platelet Count: 258 10*3/uL (ref 150–400)
RBC: 3.26 MIL/uL — ABNORMAL LOW (ref 4.22–5.81)
RDW: 14.9 % (ref 11.5–15.5)
WBC Count: 3.5 10*3/uL — ABNORMAL LOW (ref 4.0–10.5)
nRBC: 0 % (ref 0.0–0.2)

## 2022-10-04 LAB — FERRITIN: Ferritin: 310 ng/mL (ref 24–336)

## 2022-10-04 MED ORDER — SODIUM CHLORIDE 0.9 % IV SOLN
900.0000 mg/m2 | Freq: Once | INTRAVENOUS | Status: AC
  Administered 2022-10-04: 2090 mg via INTRAVENOUS
  Filled 2022-10-04: qty 15.76

## 2022-10-04 MED ORDER — HEPARIN SOD (PORK) LOCK FLUSH 100 UNIT/ML IV SOLN
500.0000 [IU] | Freq: Once | INTRAVENOUS | Status: AC | PRN
  Administered 2022-10-04: 500 [IU]
  Filled 2022-10-04: qty 5

## 2022-10-04 MED ORDER — SODIUM CHLORIDE 0.9 % IV SOLN
Freq: Once | INTRAVENOUS | Status: AC
  Filled 2022-10-04: qty 250

## 2022-10-04 MED ORDER — PROCHLORPERAZINE MALEATE 10 MG PO TABS
10.0000 mg | ORAL_TABLET | Freq: Once | ORAL | Status: AC
  Administered 2022-10-04: 10 mg via ORAL
  Filled 2022-10-04: qty 1

## 2022-10-04 NOTE — Assessment & Plan Note (Signed)
Patient has no radiographic evidence of metastatic melanoma on recent CT scan done at [facility] immunotherapy. Continue surveillance 

## 2022-10-04 NOTE — Assessment & Plan Note (Signed)
Encourage oral hydration and avoid nephrotoxins.   

## 2022-10-04 NOTE — Assessment & Plan Note (Signed)
Chemotherapy plan as listed above 

## 2022-10-04 NOTE — Patient Instructions (Signed)

## 2022-10-04 NOTE — Progress Notes (Signed)
Pt in for follow up denies any concerns.  °

## 2022-10-04 NOTE — Assessment & Plan Note (Addendum)
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  S/p chemotherapy Xeloda 825mg /m2 BID+ concurrent RT Previously on adjuvant Xeloda [1000 mg/m2 BID for 14 of every 21 days] -dose reduced to 825mg /m2 BID due to skin rash, still not able to tolerate- discontinue.  switched to gemcitabine weekly, 2 weeks on, 1 week off, [adjustment due to cytopenia] Labs are reviewed and discussed with patient.  proceed with cycle 5 D1 gemcitabine -dose reduce to 900mg /m2 1 week lab gemcitabine.

## 2022-10-04 NOTE — Assessment & Plan Note (Signed)
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  Hold ferrous sulfate 325mg daily as it changes his stool color. Iron panel showed ferritin is above 200. Hemoglobin has improved 

## 2022-10-04 NOTE — Assessment & Plan Note (Signed)
Xanax 0.5 mg every 12 hours as needed  for anxiety.

## 2022-10-04 NOTE — Addendum Note (Signed)
Addended by: Rickard Patience on: 10/04/2022 09:43 AM   Modules accepted: Orders

## 2022-10-04 NOTE — Progress Notes (Signed)
Hematology/Oncology Progress note [phone] [phone]    CHIEF COMPLAINTS/REASON FOR VISIT:  Follow up for melanoma, cholangiocarcinoma.    ASSESSMENT & PLAN:   Cancer Staging  Cholangiocarcinoma [facility]) Staging form: Intrahepatic Bile Duct, AJCC 8th Edition - Pathologic stage from 01/25/2022: Stage Unknown (pT1a, pNX, cM0) - Signed by Rickard Patience, MD on 02/21/2022  Malignant melanoma of overlapping sites [facility]) Staging form: Melanoma of the Skin, AJCC 8th Edition - Pathologic: Stage Unknown (rpTX, pN1b, cM0) - Signed by Rickard Patience, MD on 07/27/2020 - Pathologic: No stage assigned - Unsigned   Cholangiocarcinoma (HCC) T1a Nx Cholangiocarcinoma Pathology was reviewed and discussed with patient.  S/p chemotherapy Xeloda 825mg /m2 BID+ concurrent RT Previously on adjuvant Xeloda [1000 mg/m2 BID for 14 of every 21 days] -dose reduced to 825mg /m2 BID due to skin rash, still not able to tolerate- discontinue.  switched to gemcitabine weekly, 2 weeks on, 1 week off, [adjustment due to cytopenia] Labs are reviewed and discussed with patient.  proceed with cycle 5 D1 gemcitabine -dose reduce to 900mg /m2 1 week lab gemcitabine.      Malignant melanoma of overlapping sites [facility]) Patient has no radiographic evidence of metastatic melanoma on recent CT scan done at [facility] immunotherapy. 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  Hold ferrous sulfate 325mg  daily as it changes his stool color. Iron panel showed ferritin is above 200. Hemoglobin has improved  Stage 3a chronic kidney disease (HCC) Encourage oral hydration and avoid nephrotoxins.      Chemotherapy induced neutropenia (HCC) Monitor ANC closely.  Neutropenia precaution.   Anxiety Xanax 0.5 mg every 12 hours as needed  for anxiety.    Encounter for antineoplastic chemotherapy Chemotherapy plan as listed above.   Follow up per LOS  All  questions were answered. The patient knows to call the clinic with any problems, questions or concerns.  Rickard Patience, MD, PhD [facility] Hematology Oncology 10/04/2022    HISTORY OF PRESENTING ILLNESS:   Edward Pitts is a  62 y.o.  male presents for recurrent malignant melanoma.   Oncology History  Malignant melanoma of overlapping sites [facility])  04/16/2019 Cancer Staging   Staging form: Melanoma of the Skin, AJCC 8th Edition - Pathologic: Stage Unknown (rpTX, pN1b, cM0) - Signed by Rickard Patience, MD on 07/27/2020 Stage prefix: Recurrence    04/23/2019 Initial Diagnosis   Malignant melanoma   -He has a history of left lower extremity melanoma in 2011, status post local excision -04/16/2019 patient underwent left groin mass resection  Resection pathology showed malignant melanoma, replacing a lymph node, with extracapsular extension, peripheral and deep margins involved.  Left inguinal contents, all 7 lymph nodes were negative for melanoma in the lymph nodes. Extranodal melanoma identified in lymphatic and interstitium between nodes -PDL1 80% TPS    07/07/2019 -  Radiation Therapy   status post adjuvant radiation.   07/23/2019 - 07/21/2021 Chemotherapy   Nivolumab q14d      06/29/2020 Imaging   CT chest abdomen pelvis showed stable postoperative appearance of the left groin.  No evidence of local recurrence.  No evidence of metastatic disease in the chest abdomen or pelvis.  Hepatic steatosis.  Stable subcentimeter fluid attenuation lesion of the lateral right lobe of the liver, likely benign cyst or hemangioma.  Coronary artery disease.  Aortic atherosclerosis   10/20/2020 Imaging   CT chest abdomen pelvis showed stable postoperative/radiation appearance of the left groin.  No evidence of local recurrence/metastatic  disease within the chest abdomen/pelvis.  Fatty liver disease.  Diverticulosis without evidence of typhlitis.  Aortic atherosclerosis   03/10/2021 Imaging   MRI brain without contrast  showed no definitive evidence of intracranial metastatic disease.  Study is limited by absence of intravenous contrast.     04/26/2021 Imaging   CT chest abdomen pelvis without contrast showed stable post operative changes of left groin with no evidence of recurrent disease.  No evidence of metastatic disease in the chest abdomen pelvis.  Aortic atherosclerosis   08/08/2021 - 08/16/2021 Hospital Admission    patient was hospitalized due to NSTEMI status post CABG x3.  He also had pacemaker The echocardiogram showed left ventricular ejection fraction of 65 to 70%,   09/15/2021 Imaging   CT chest abdomen pelvis w contrast  IMPRESSION: 1. Subtle hypodense 9 mm lesion in the left lobe of the liver is new from prior imaging including previous contrasted CT dating back to December 10, 2019, with the lesion appearing to equilibrate with background liver on delayed imaging sequence but is incompletely evaluated on this imaging study and technically nonspecific possibly reflecting a benign perfusional variant and while its appearance is not typical for that of a melanoma metastasis, it is not excluded on this examination. Suggest more definitive characterization by hepatic protocol MRI with and without contrast. 2. Stable postoperative changes in the left groin without evidence of local recurrent disease.3. No evidence of metastatic disease in the chest or pelvis.4.  Aortic Atherosclerosis (ICD10-I70.0).      09/23/2021 Imaging   Contrast-enhanced liver ultrasound Mildly hypoenhancing 2.2 cm mass in the posterior aspect of the left lobe of the liver with washout characteristics concerning for non hepatocellular malignancy, concerning for melanotic metastasis given history. The lesion is in an unfavorable location for percutaneous biopsy. Consider PET-CT for further characterization   10/21/2021 -  Chemotherapy   Nivolumab q14d      11/10/2021 Imaging   MRI Brain w wo contrast Negative for metastatic  disease to the brain   11/10/2021 Imaging   MRI abdomen w wo contrast 1. Lesion of the posterior superior left lobe of the liver, hepatic segment II, measuring 1.9 x 1.8 cm corresponding to findings of prior imaging. Evaluation is somewhat limited breath motion artifact however there is subtle associated rim enhancement of this lesion. Findings are most in keeping with a hepatic metastasis in the setting of known recurrent melanoma. 2. Mild hepatic steatosis. 3. Cardiomegaly.    02/27/2022 - 02/27/2022 Chemotherapy   Patient is on Treatment Plan : Capecitabine (825 mg/m2 bid) + XRT     07/12/2022 -  Chemotherapy   Patient is on Treatment Plan : PANCREAS Gemcitabine D1,8(1000) q21d     Cholangiocarcinoma (HCC)  12/19/2021 Initial Diagnosis   Liver biopsy showed carcinoma  -The slides on the patient's prior left inguinal lymph node biopsy ([id number]) with metastatic melanoma were reviewed in conjunction with  this case. The morphology of the malignant cells between the two cases are dissimilar. A limited panel of immunohistochemical stains was performed and the neoplastic cells are positive for superpancytokeratin, cytokeratin 7 (diffuse, strong), cytokeratin 20 (patchy, moderate) and negative for S100, SOX-10, and HepPar-1.These findings are consistent with carcinoma. The morphologic findings and pattern of immunohistochemical staining are non-specific and possible sites of origin include but are not limited to pancreaticobiliary tract, GI tract, prostate, kidney, lung, and breast. Per [facility], the patient had a PET scan performed in July 2023 which demonstrated a single hepatic  hypermetabolic lesion without  other sites of disease.    01/19/2022 Surgery   Liver lesion resection at [facility] by Dr.Zani  A.  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  Tumor extends to hepatic parenchymal margin, but all other margins are negative for  tumor. See synoptic report and comment.   Background liver with: Mild steatosis (20%).  No definitive evidence of steatohepatitis.   Mild periportal fibrosis (trichrome and reticulin). No stainable iron (Prussian blue stain).  No evidence to support alpha-1-antitrypsin deficiency on PAS-D stain.   pT1a pNx   01/25/2022 Cancer Staging   Staging form: Intrahepatic Bile Duct, AJCC 8th Edition - Pathologic stage from 01/25/2022: Stage Unknown (pT1a, pNX, cM0) - Signed by Rickard Patience, MD on 02/21/2022 Stage prefix: Initial diagnosis   03/23/2022 - 05/01/2022 Radiation Therapy   Concurrent Xeloda 825mg /m2 BID + Radiation.    05/08/2022 -  Chemotherapy   Xeloda [1000 mg/m2 BID for 14 of every 21 days], plan 4 months.   05/08/2022, cycle 1 Xeloda.  Xeloda was stopped on 05/19/22 due to developing rash on face as well as dorsum of hands. Treated with steroid,symptoms resolved.  3/4-3/13/2024  Xeloda was stopped early due to groin cellulitis, he also developed similar skin rash again  Plan 3/25 cycle 3 dose reduce Xeloda 825 mg/m2   2000mg  BID x 14 days.   05/09/2022 Imaging   CT chest abdomen pelvis with contrast at [facility] showed Status post partial left hepatectomy without CT evidence of recurrent or metastatic disease in the chest, abdomen, pelvis.     07/24/2022 - 07/26/2022 Hospital Admission   Patient was hospitalized due to symptomatic anemia, status post 1 unit of PRBC transfusion. Also neutropenia secondary to chemotherapy.  Nadir was 0.9.  Patient was afebrile.   09/05/2022 Imaging   CT chest abdomen pelvis with contrast at [facility]  Postsurgical changes of left hepatectomy without evidence of recurrent or  metastatic disease.     04/18/2021, patient establish care with neurology Dr. Barbaraann Cao for intermittent altered cognition.  he was recommended to start Vimpat.    INTERVAL HISTORY Makaio Mach is a 62 y.o. male who has above history reviewed by me today presents for follow up visit for  management of inguinal nodal recurrence of melanoma, resected T1a cholangiocarcinoma. During the interval mother passed away. He is trying to cope.  He reports feeling well, denies any weakness or lightheaded.   :Review of Systems  Constitutional:  Positive for fatigue. Negative for appetite change, chills, fever and unexpected weight change.  HENT:   Negative for hearing loss and voice change.   Eyes:  Negative for eye problems and icterus.  Respiratory:  Negative for chest tightness, cough and shortness of breath.   Cardiovascular:  Positive for leg swelling. Negative for chest pain.  Gastrointestinal:  Negative for abdominal distention and abdominal pain.  Endocrine: Negative for hot flashes.  Genitourinary:  Negative for difficulty urinating, dysuria and frequency.   Musculoskeletal:        Status post left hip replacement  Skin:  Negative for itching.       Skin hypo-pigmentation on upper extremities, no change   Neurological:  Negative for light-headedness and numbness.  Hematological:  Negative for adenopathy. Does not bruise/bleed easily.  Psychiatric/Behavioral:  Negative for confusion.        Grieving.     MEDICAL HISTORY:  Past Medical History:  Diagnosis Date   AKI (acute kidney injury) (HCC) 08/24/2018   Anemia  iron treatments   Anxiety    Aortic atherosclerosis (HCC)    Arthritis    Cancer of groin (HCC) 2021   left groin, resected, radiation   Cataract    Complication of anesthesia    PONV   Coronary artery disease    Dizziness of unknown etiology    has led to seizures and passing out.   Family history of adverse reaction to anesthesia    PONV mother   GERD (gastroesophageal reflux disease)    H/O Malignant melanoma 09/18/2022   History of complete heart block    PPM placed   Hyperlipidemia    Hypertension    LBBB (left bundle branch block)    Lymphedema of left leg    uses thigh high compression stockings   Melanoma (HCC) 2012   skin cancer,  left thigh   OSA on CPAP    PONV (postoperative nausea and vomiting) 04/16/2019   Port-A-Cath in place    RIGHT chest wall   Presence of cardiac pacemaker    [employer]   Problem related to unspecified psychosocial circumstances 09/18/2022   Seizures (HCC)    still has episodes of dizziness. last event 1 month ago (march 2022) and will pass out. takes clonazepam    SURGICAL HISTORY: Past Surgical History:  Procedure Laterality Date   CORONARY ARTERY BYPASS GRAFT N/A 08/10/2021   Procedure: CORONARY ARTERY BYPASS GRAFTING (CABG) X 3 USING LEFT INTERNAL MAMMARY ARTERY AND RIGHT GREATER SAPHENOUS VEIN;  Surgeon: Lovett Sox, MD;  Location: [facility] OR;  Service: Open Heart Surgery;  Laterality: N/A;   CT RADIATION THERAPY GUIDE     left groin   dental implant     permanent implant   ENDOVEIN HARVEST OF GREATER SAPHENOUS VEIN Right 08/10/2021   Procedure: ENDOVEIN HARVEST OF GREATER SAPHENOUS VEIN;  Surgeon: Lovett Sox, MD;  Location: [facility] OR;  Service: Open Heart Surgery;  Laterality: Right;   KNEE SURGERY Left    arthroscopy   LEFT HEART CATH AND CORONARY ANGIOGRAPHY Left 06/29/2017   Procedure: LEFT HEART CATH AND CORONARY ANGIOGRAPHY;  Surgeon: Lamar Blinks, MD;  Location: [facility] INVASIVE CV LAB;  Service: Cardiovascular;  Laterality: Left;   LEFT HEART CATH AND CORONARY ANGIOGRAPHY N/A 08/08/2021   Procedure: LEFT HEART CATH AND CORONARY ANGIOGRAPHY;  Surgeon: Lamar Blinks, MD;  Location: [facility] INVASIVE CV LAB;  Service: Cardiovascular;  Laterality: N/A;   LYMPH NODE DISSECTION Left 04/16/2019   Procedure: Left inguinal Lymph Node Dissection;  Surgeon: Almond Lint, MD;  Location: [facility] OR;  Service: General;  Laterality: Left;   MELANOMA EXCISION Left 04/16/2019   Procedure: MELANOMA EXCISION LEFT GROIN MASS;  Surgeon: Almond Lint, MD;  Location: [facility] OR;  Service: General;  Laterality: Left;   MELANOMA EXCISION WITH SENTINEL LYMPH NODE BIOPSY Left 2012   Left calf    PACEMAKER  INSERTION N/A 08/26/2018   Procedure: INSERTION PACEMAKER;  Surgeon: Marcina Millard, MD;  Location: [facility] ORS;  Service: Cardiovascular;  Laterality: N/A;   PORTA CATH INSERTION N/A 08/26/2019   Procedure: PORTA CATH INSERTION;  Surgeon: Renford Dills, MD;  Location: [facility] INVASIVE CV LAB;  Service: Cardiovascular;  Laterality: N/A;   SUPERFICIAL LYMPH NODE BIOPSY / EXCISION Left 2020   lymph nodes removed around left groin melanoma site   TEE WITHOUT CARDIOVERSION N/A 08/10/2021   Procedure: TRANSESOPHAGEAL ECHOCARDIOGRAM (TEE);  Surgeon: Lovett Sox, MD;  Location[facility] OR;  Service: Open Heart Surgery;  Laterality: N/A;   TEMPORARY PACEMAKER N/A  08/25/2018   Procedure: TEMPORARY PACEMAKER;  Surgeon: Tonny Bollman, MD;  Location: [facility] INVASIVE CV LAB;  Service: Cardiovascular;  Laterality: N/A;   TOTAL HIP ARTHROPLASTY Left 07/14/2020   Procedure: TOTAL HIP ARTHROPLASTY;  Surgeon: Donato Heinz, MD;  Location: [facility] ORS;  Service: Orthopedics;  Laterality: Left;    SOCIAL HISTORY: Social History   Socioeconomic History   Marital status: Married    Spouse name: [name]    Number of children: 7   Years of education: 12   Highest education level: Not on file  Occupational History    Comment: disability  Tobacco Use   Smoking status: Never   Smokeless tobacco: Never  Vaping Use   Vaping Use: Never used  Substance and Sexual Activity   Alcohol use: No   Drug use: No   Sexual activity: Not Currently  Other Topics Concern   Not on file  Social History Narrative   Lives with  Wife,   Has 2 small dogs   Caffeine use: sodas (2 per day)      Out of work on disability.  Has a walk in shower. No stairs to climb   Oncology treatment ongoing. Uses port a cath for treatment.      pacemaker   Social Determinants of Health   Financial Resource Strain: Low Risk  (02/12/2019)   Overall Financial Resource Strain (CARDIA)    Difficulty of Paying Living Expenses: Not hard at all   Food Insecurity: Food Insecurity Present (02/21/2022)   Hunger Vital Sign    Worried About Running Out of Food in the Last Year: Sometimes true    Ran Out of Food in the Last Year: Often true  Transportation Needs: No Transportation Needs (07/25/2022)   PRAPARE - Administrator, Civil Service (Medical): No    Lack of Transportation (Non-Medical): No  Physical Activity: Unknown (02/12/2019)   Exercise Vital Sign    Days of Exercise per Week: 0 days    Minutes of Exercise per Session: Not on file  Stress: No Stress Concern Present (02/12/2019)   [employer] of Occupational Health - Occupational Stress Questionnaire    Feeling of Stress : Only a little  Social Connections: Unknown (02/12/2019)   Social Connection and Isolation Panel [NHANES]    Frequency of Communication with Friends and Family: More than three times a week    Frequency of Social Gatherings with Friends and Family: Not on file    Attends Religious Services: Not on file    Active Member of Clubs or Organizations: Not on file    Attends Banker Meetings: Not on file    Marital Status: Married  Intimate Partner Violence: Not At Risk (07/25/2022)   Humiliation, Afraid, Rape, and Kick questionnaire    Fear of Current or Ex-Partner: No    Emotionally Abused: No    Physically Abused: No    Sexually Abused: No    FAMILY HISTORY: Family History  Problem Relation Age of Onset   Cancer Paternal Grandmother     ALLERGIES:  is allergic to ibuprofen, levetiracetam, nsaids, and capecitabine.  MEDICATIONS:  Current Outpatient Medications  Medication Sig Dispense Refill   ALPRAZolam (XANAX) 0.5 MG tablet Take 1 tablet (0.5 mg total) by mouth 2 (two) times daily as needed for anxiety. 30 tablet 0   apixaban (ELIQUIS) 5 MG TABS tablet Take 5 mg by mouth 2 (two) times daily.     atorvastatin (LIPITOR) 40 MG tablet Take 0.5 tablets  by mouth at bedtime.     Carboxymeth-Glyc-Polysorb PF (REFRESH  OPTIVE MEGA-3) 0.5-1-0.5 % SOLN Place 1 drop into both eyes daily as needed (dry eyes).     clonazePAM (KLONOPIN) 0.5 MG tablet TAKE 1 TABLET BY MOUTH IN THE MORNING AND 1 BY MOUTH IN THE EVENING per pt 120 tablet 1   clotrimazole (CLOTRIMAZOLE ANTI-FUNGAL) 1 % cream Apply 1 Application topically 2 (two) times daily. Apply to the L inguinal fold 30 g 0   hydrocortisone 2.5 % ointment Apply 1 application. topically daily as needed (itching).     Lacosamide 100 MG TABS Take 1 tablet (100 mg total) by mouth in the morning and at bedtime. 60 tablet 3   lidocaine-prilocaine (EMLA) cream Apply 1 Application topically as needed. Apply small amount of cream to port site approx 1-2 hours prior to appointment. 30 g 11   metoprolol succinate (TOPROL-XL) 25 MG 24 hr tablet Take 25 mg by mouth daily.     Multiple Vitamin (MULTIVITAMIN WITH MINERALS) TABS tablet Take 1 tablet by mouth daily. Centrum Silver     nystatin (MYCOSTATIN/NYSTOP) powder Apply 1 Application topically 3 (three) times daily. 60 g 1   omeprazole (PRILOSEC) 20 MG capsule TAKE 1 CAPSULE BY MOUTH EVERY DAY 90 capsule 0   ondansetron (ZOFRAN) 4 MG tablet TAKE 1 TABLET BY MOUTH EVERY 8 HOURS AS NEEDED FOR NAUSEA AND VOMITING 90 tablet 1   No current facility-administered medications for this visit.   Facility-Administered Medications Ordered in Other Visits  Medication Dose Route Frequency Provider Last Rate Last Admin   heparin lock flush 100 UNIT/ML injection            heparin lock flush 100 UNIT/ML injection              PHYSICAL EXAMINATION: ECOG PERFORMANCE STATUS: 1 - Symptomatic but completely ambulatory Vitals:   10/04/22 0909  BP: 132/64  Pulse: 63  Resp: 16  Temp: 97.7 F (36.5 C)  SpO2: 96%   Filed Weights   10/04/22 0909  Weight: 257 lb (116.6 kg)    Physical Exam Constitutional:      General: He is not in acute distress.    Comments: Patient ambulates independently  HENT:     Head: Normocephalic and  atraumatic.  Eyes:     General: No scleral icterus.    Pupils: Pupils are equal, round, and reactive to light.  Cardiovascular:     Rate and Rhythm: Normal rate and regular rhythm.     Heart sounds: Normal heart sounds.  Pulmonary:     Effort: Pulmonary effort is normal. No respiratory distress.     Breath sounds: No wheezing.  Abdominal:     General: Bowel sounds are normal. There is no distension.     Palpations: Abdomen is soft. There is no mass.     Tenderness: There is no abdominal tenderness.     Comments:    Musculoskeletal:        General: No deformity. Normal range of motion.     Cervical back: Normal range of motion and neck supple.     Comments: Left lower extremity edema-chronic   Skin:    General: Skin is warm and dry.     Comments: Bilateral dorsum had raised erythematous rash-improved Left groin chronic edema with erythematous changes.-Improved with residual erythema  Neurological:     Mental Status: He is alert and oriented to person, place, and time. Mental status is at baseline.  Cranial Nerves: No cranial nerve deficit.     Coordination: Coordination normal.  Psychiatric:        Mood and Affect: Mood normal.     LABORATORY DATA:  I have reviewed the data as listed    Latest Ref Rng & Units 10/04/2022    8:50 AM 09/20/2022    9:21 AM 09/13/2022    8:27 AM  CBC  WBC 4.0 - 10.5 K/uL 3.5  2.1  5.1   Hemoglobin 13.0 - 17.0 g/dL 40.9  81.1  91.4   Hematocrit 39.0 - 52.0 % 31.4  31.1  33.5   Platelets 150 - 400 K/uL 258  133  189       Latest Ref Rng & Units 10/04/2022    8:50 AM 09/20/2022    9:21 AM 09/13/2022    8:27 AM  CMP  Glucose 70 - 99 mg/dL 782  956  213   BUN 8 - 23 mg/dL 19  13  21    Creatinine 0.61 - 1.24 mg/dL 0.86  5.78  4.69   Sodium 135 - 145 mmol/L 138  139  138   Potassium 3.5 - 5.1 mmol/L 3.9  3.9  3.7   Chloride 98 - 111 mmol/L 107  107  108   CO2 22 - 32 mmol/L 22  26  23    Calcium 8.9 - 10.3 mg/dL 8.9  9.0  8.8   Total  Protein 6.5 - 8.1 g/dL 6.7  6.7  6.9   Total Bilirubin 0.3 - 1.2 mg/dL 0.4  0.6  0.5   Alkaline Phos 38 - 126 U/L 81  80  81   AST 15 - 41 U/L 50  43  33   ALT 0 - 44 U/L 89  68  48      RADIOGRAPHIC STUDIES: I have personally reviewed the radiological images as listed and agreed with the findings in the report. DG Chest Port 1 View  Result Date: 07/24/2022 CLINICAL DATA:  Dyspnea on exertion EXAM: PORTABLE CHEST 1 VIEW COMPARISON:  Chest x-ray 02/21/2022 FINDINGS: Right chest port catheter tip projects over the SVC. Left-sided pacemaker again noted. Sternotomy wires and mediastinal clips are present. The cardiomediastinal silhouette is within normal limits. The lungs are clear. There is no pleural effusion or pneumothorax. No acute fractures are seen. IMPRESSION: No active disease. Electronically Signed   By: Darliss Cheney M.D.   On: 07/24/2022 17:47

## 2022-10-04 NOTE — Assessment & Plan Note (Signed)
Monitor ANC closely.  Neutropenia precaution.  

## 2022-10-06 ENCOUNTER — Other Ambulatory Visit: Payer: Self-pay | Admitting: Internal Medicine

## 2022-10-10 DIAGNOSIS — M1A00X0 Idiopathic chronic gout, unspecified site, without tophus (tophi): Secondary | ICD-10-CM | POA: Diagnosis not present

## 2022-10-10 DIAGNOSIS — Z79899 Other long term (current) drug therapy: Secondary | ICD-10-CM | POA: Diagnosis not present

## 2022-10-11 ENCOUNTER — Ambulatory Visit: Payer: 59

## 2022-10-11 ENCOUNTER — Other Ambulatory Visit: Payer: Self-pay | Admitting: Oncology

## 2022-10-11 ENCOUNTER — Ambulatory Visit: Payer: 59 | Admitting: Oncology

## 2022-10-11 ENCOUNTER — Other Ambulatory Visit: Payer: 59

## 2022-10-11 ENCOUNTER — Inpatient Hospital Stay: Payer: 59

## 2022-10-11 DIAGNOSIS — C438 Malignant melanoma of overlapping sites of skin: Secondary | ICD-10-CM

## 2022-10-11 DIAGNOSIS — Z5111 Encounter for antineoplastic chemotherapy: Secondary | ICD-10-CM | POA: Diagnosis not present

## 2022-10-11 DIAGNOSIS — C779 Secondary and unspecified malignant neoplasm of lymph node, unspecified: Secondary | ICD-10-CM

## 2022-10-11 DIAGNOSIS — C221 Intrahepatic bile duct carcinoma: Secondary | ICD-10-CM | POA: Diagnosis not present

## 2022-10-11 DIAGNOSIS — N1831 Chronic kidney disease, stage 3a: Secondary | ICD-10-CM | POA: Diagnosis not present

## 2022-10-11 DIAGNOSIS — D631 Anemia in chronic kidney disease: Secondary | ICD-10-CM | POA: Diagnosis not present

## 2022-10-11 DIAGNOSIS — D015 Carcinoma in situ of liver, gallbladder and bile ducts: Secondary | ICD-10-CM

## 2022-10-11 DIAGNOSIS — D701 Agranulocytosis secondary to cancer chemotherapy: Secondary | ICD-10-CM | POA: Diagnosis not present

## 2022-10-11 DIAGNOSIS — Z85820 Personal history of malignant melanoma of skin: Secondary | ICD-10-CM | POA: Diagnosis not present

## 2022-10-11 LAB — CMP (CANCER CENTER ONLY)
ALT: 86 U/L — ABNORMAL HIGH (ref 0–44)
AST: 46 U/L — ABNORMAL HIGH (ref 15–41)
Albumin: 3.8 g/dL (ref 3.5–5.0)
Alkaline Phosphatase: 80 U/L (ref 38–126)
Anion gap: 6 (ref 5–15)
BUN: 20 mg/dL (ref 8–23)
CO2: 24 mmol/L (ref 22–32)
Calcium: 8.8 mg/dL — ABNORMAL LOW (ref 8.9–10.3)
Chloride: 107 mmol/L (ref 98–111)
Creatinine: 1.06 mg/dL (ref 0.61–1.24)
GFR, Estimated: >60 mL/min (ref >60)
Glucose, Bld: 136 mg/dL — ABNORMAL HIGH (ref 70–99)
Potassium: 3.8 mmol/L (ref 3.5–5.1)
Sodium: 137 mmol/L (ref 135–145)
Total Bilirubin: 0.4 mg/dL (ref 0.3–1.2)
Total Protein: 6.8 g/dL (ref 6.5–8.1)

## 2022-10-11 LAB — CBC WITH DIFFERENTIAL (CANCER CENTER ONLY)
Abs Immature Granulocytes: 0.13 10*3/uL — ABNORMAL HIGH (ref 0.00–0.07)
Basophils Absolute: 0 10*3/uL (ref 0.0–0.1)
Basophils Relative: 1 %
Eosinophils Absolute: 0 10*3/uL (ref 0.0–0.5)
Eosinophils Relative: 1 %
HCT: 30.1 % — ABNORMAL LOW (ref 39.0–52.0)
Hemoglobin: 10.3 g/dL — ABNORMAL LOW (ref 13.0–17.0)
Immature Granulocytes: 7 %
Lymphocytes Relative: 21 %
Lymphs Abs: 0.4 10*3/uL — ABNORMAL LOW (ref 0.7–4.0)
MCH: 32.6 pg (ref 26.0–34.0)
MCHC: 34.2 g/dL (ref 30.0–36.0)
MCV: 95.3 fL (ref 80.0–100.0)
Monocytes Absolute: 0.4 10*3/uL (ref 0.1–1.0)
Monocytes Relative: 23 %
Neutro Abs: 0.9 10*3/uL — ABNORMAL LOW (ref 1.7–7.7)
Neutrophils Relative %: 47 %
Platelet Count: 262 10*3/uL (ref 150–400)
RBC: 3.16 MIL/uL — ABNORMAL LOW (ref 4.22–5.81)
RDW: 14.2 % (ref 11.5–15.5)
WBC Count: 1.9 10*3/uL — ABNORMAL LOW (ref 4.0–10.5)
nRBC: 4.8 % — ABNORMAL HIGH (ref 0.0–0.2)

## 2022-10-11 LAB — URIC ACID: Uric Acid, Serum: 5.4 mg/dL (ref 3.7–8.6)

## 2022-10-11 NOTE — Progress Notes (Signed)
ANC 0.9. No treatment today per Dr. Cathie Hoops. Defer lab and chemotherapy 1 week. Neutropenic precautions verbalized to patient. New AVS printed with updated appointments. Patient understands instructions.

## 2022-10-12 ENCOUNTER — Other Ambulatory Visit: Payer: Self-pay | Admitting: Oncology

## 2022-10-12 DIAGNOSIS — C438 Malignant melanoma of overlapping sites of skin: Secondary | ICD-10-CM

## 2022-10-18 ENCOUNTER — Other Ambulatory Visit: Payer: 59

## 2022-10-18 ENCOUNTER — Inpatient Hospital Stay: Payer: 59

## 2022-10-18 VITALS — BP 141/64 | HR 61 | Temp 96.7°F | Resp 16 | Wt 256.8 lb

## 2022-10-18 DIAGNOSIS — D631 Anemia in chronic kidney disease: Secondary | ICD-10-CM | POA: Diagnosis not present

## 2022-10-18 DIAGNOSIS — C221 Intrahepatic bile duct carcinoma: Secondary | ICD-10-CM | POA: Diagnosis not present

## 2022-10-18 DIAGNOSIS — Z5111 Encounter for antineoplastic chemotherapy: Secondary | ICD-10-CM | POA: Diagnosis not present

## 2022-10-18 DIAGNOSIS — D701 Agranulocytosis secondary to cancer chemotherapy: Secondary | ICD-10-CM | POA: Diagnosis not present

## 2022-10-18 DIAGNOSIS — Z85820 Personal history of malignant melanoma of skin: Secondary | ICD-10-CM | POA: Diagnosis not present

## 2022-10-18 DIAGNOSIS — C438 Malignant melanoma of overlapping sites of skin: Secondary | ICD-10-CM

## 2022-10-18 DIAGNOSIS — N1831 Chronic kidney disease, stage 3a: Secondary | ICD-10-CM | POA: Diagnosis not present

## 2022-10-18 LAB — CMP (CANCER CENTER ONLY)
ALT: 71 U/L — ABNORMAL HIGH (ref 0–44)
AST: 46 U/L — ABNORMAL HIGH (ref 15–41)
Albumin: 4 g/dL (ref 3.5–5.0)
Alkaline Phosphatase: 83 U/L (ref 38–126)
Anion gap: 5 (ref 5–15)
BUN: 19 mg/dL (ref 8–23)
CO2: 25 mmol/L (ref 22–32)
Calcium: 9 mg/dL (ref 8.9–10.3)
Chloride: 106 mmol/L (ref 98–111)
Creatinine: 1.03 mg/dL (ref 0.61–1.24)
GFR, Estimated: >60 mL/min (ref >60)
Glucose, Bld: 149 mg/dL — ABNORMAL HIGH (ref 70–99)
Potassium: 4 mmol/L (ref 3.5–5.1)
Sodium: 136 mmol/L (ref 135–145)
Total Bilirubin: 0.5 mg/dL (ref 0.3–1.2)
Total Protein: 7 g/dL (ref 6.5–8.1)

## 2022-10-18 LAB — CBC WITH DIFFERENTIAL (CANCER CENTER ONLY)
Abs Immature Granulocytes: 0.08 10*3/uL — ABNORMAL HIGH (ref 0.00–0.07)
Basophils Absolute: 0 10*3/uL (ref 0.0–0.1)
Basophils Relative: 1 %
Eosinophils Absolute: 0.1 10*3/uL (ref 0.0–0.5)
Eosinophils Relative: 2 %
HCT: 35.3 % — ABNORMAL LOW (ref 39.0–52.0)
Hemoglobin: 11.7 g/dL — ABNORMAL LOW (ref 13.0–17.0)
Immature Granulocytes: 2 %
Lymphocytes Relative: 9 %
Lymphs Abs: 0.4 10*3/uL — ABNORMAL LOW (ref 0.7–4.0)
MCH: 32.2 pg (ref 26.0–34.0)
MCHC: 33.1 g/dL (ref 30.0–36.0)
MCV: 97.2 fL (ref 80.0–100.0)
Monocytes Absolute: 0.7 10*3/uL (ref 0.1–1.0)
Monocytes Relative: 15 %
Neutro Abs: 3.3 10*3/uL (ref 1.7–7.7)
Neutrophils Relative %: 71 %
Platelet Count: 104 10*3/uL — ABNORMAL LOW (ref 150–400)
RBC: 3.63 MIL/uL — ABNORMAL LOW (ref 4.22–5.81)
RDW: 14.4 % (ref 11.5–15.5)
WBC Count: 4.6 10*3/uL (ref 4.0–10.5)
nRBC: 0 % (ref 0.0–0.2)

## 2022-10-18 MED ORDER — PROCHLORPERAZINE MALEATE 10 MG PO TABS
10.0000 mg | ORAL_TABLET | Freq: Once | ORAL | Status: AC
  Administered 2022-10-18: 10 mg via ORAL
  Filled 2022-10-18: qty 1

## 2022-10-18 MED ORDER — SODIUM CHLORIDE 0.9 % IV SOLN
Freq: Once | INTRAVENOUS | Status: AC
  Filled 2022-10-18: qty 250

## 2022-10-18 MED ORDER — SODIUM CHLORIDE 0.9 % IV SOLN
900.0000 mg/m2 | Freq: Once | INTRAVENOUS | Status: AC
  Administered 2022-10-18: 2090 mg via INTRAVENOUS
  Filled 2022-10-18: qty 54.97

## 2022-10-18 MED ORDER — HEPARIN SOD (PORK) LOCK FLUSH 100 UNIT/ML IV SOLN
500.0000 [IU] | Freq: Once | INTRAVENOUS | Status: AC | PRN
  Administered 2022-10-18: 500 [IU]
  Filled 2022-10-18: qty 5

## 2022-10-23 ENCOUNTER — Other Ambulatory Visit: Payer: Self-pay | Admitting: Internal Medicine

## 2022-10-23 DIAGNOSIS — R569 Unspecified convulsions: Secondary | ICD-10-CM

## 2022-10-25 ENCOUNTER — Other Ambulatory Visit: Payer: 59

## 2022-10-25 ENCOUNTER — Ambulatory Visit: Payer: 59

## 2022-10-25 ENCOUNTER — Ambulatory Visit: Payer: 59 | Admitting: Oncology

## 2022-10-31 ENCOUNTER — Other Ambulatory Visit: Payer: Self-pay | Admitting: Oncology

## 2022-10-31 DIAGNOSIS — C779 Secondary and unspecified malignant neoplasm of lymph node, unspecified: Secondary | ICD-10-CM | POA: Diagnosis not present

## 2022-10-31 DIAGNOSIS — I442 Atrioventricular block, complete: Secondary | ICD-10-CM | POA: Diagnosis not present

## 2022-10-31 DIAGNOSIS — Z950 Presence of cardiac pacemaker: Secondary | ICD-10-CM | POA: Diagnosis not present

## 2022-10-31 DIAGNOSIS — I447 Left bundle-branch block, unspecified: Secondary | ICD-10-CM | POA: Diagnosis not present

## 2022-10-31 DIAGNOSIS — Z951 Presence of aortocoronary bypass graft: Secondary | ICD-10-CM | POA: Diagnosis not present

## 2022-10-31 DIAGNOSIS — N1831 Chronic kidney disease, stage 3a: Secondary | ICD-10-CM | POA: Diagnosis not present

## 2022-10-31 DIAGNOSIS — C221 Intrahepatic bile duct carcinoma: Secondary | ICD-10-CM | POA: Diagnosis not present

## 2022-10-31 DIAGNOSIS — I2510 Atherosclerotic heart disease of native coronary artery without angina pectoris: Secondary | ICD-10-CM | POA: Diagnosis not present

## 2022-10-31 DIAGNOSIS — R001 Bradycardia, unspecified: Secondary | ICD-10-CM | POA: Diagnosis not present

## 2022-10-31 DIAGNOSIS — I10 Essential (primary) hypertension: Secondary | ICD-10-CM | POA: Diagnosis not present

## 2022-11-01 ENCOUNTER — Other Ambulatory Visit: Payer: 59

## 2022-11-01 ENCOUNTER — Encounter: Payer: Self-pay | Admitting: Oncology

## 2022-11-01 ENCOUNTER — Inpatient Hospital Stay: Payer: 59

## 2022-11-01 ENCOUNTER — Ambulatory Visit: Payer: 59

## 2022-11-01 ENCOUNTER — Inpatient Hospital Stay: Payer: 59 | Attending: Oncology | Admitting: Oncology

## 2022-11-01 ENCOUNTER — Other Ambulatory Visit: Payer: Self-pay

## 2022-11-01 VITALS — BP 146/65 | HR 86 | Temp 97.0°F | Resp 18 | Wt 254.3 lb

## 2022-11-01 DIAGNOSIS — Z853 Personal history of malignant neoplasm of breast: Secondary | ICD-10-CM | POA: Diagnosis not present

## 2022-11-01 DIAGNOSIS — T451X5A Adverse effect of antineoplastic and immunosuppressive drugs, initial encounter: Secondary | ICD-10-CM | POA: Diagnosis not present

## 2022-11-01 DIAGNOSIS — D631 Anemia in chronic kidney disease: Secondary | ICD-10-CM | POA: Diagnosis not present

## 2022-11-01 DIAGNOSIS — C438 Malignant melanoma of overlapping sites of skin: Secondary | ICD-10-CM

## 2022-11-01 DIAGNOSIS — N1832 Chronic kidney disease, stage 3b: Secondary | ICD-10-CM

## 2022-11-01 DIAGNOSIS — D701 Agranulocytosis secondary to cancer chemotherapy: Secondary | ICD-10-CM | POA: Insufficient documentation

## 2022-11-01 DIAGNOSIS — C221 Intrahepatic bile duct carcinoma: Secondary | ICD-10-CM | POA: Diagnosis not present

## 2022-11-01 DIAGNOSIS — Z95828 Presence of other vascular implants and grafts: Secondary | ICD-10-CM | POA: Diagnosis not present

## 2022-11-01 DIAGNOSIS — Z5111 Encounter for antineoplastic chemotherapy: Secondary | ICD-10-CM

## 2022-11-01 DIAGNOSIS — N1831 Chronic kidney disease, stage 3a: Secondary | ICD-10-CM | POA: Diagnosis not present

## 2022-11-01 LAB — CBC WITH DIFFERENTIAL (CANCER CENTER ONLY)
Abs Immature Granulocytes: 0.06 10*3/uL (ref 0.00–0.07)
Basophils Absolute: 0 10*3/uL (ref 0.0–0.1)
Basophils Relative: 1 %
Eosinophils Absolute: 0.1 10*3/uL (ref 0.0–0.5)
Eosinophils Relative: 2 %
HCT: 33.6 % — ABNORMAL LOW (ref 39.0–52.0)
Hemoglobin: 11.2 g/dL — ABNORMAL LOW (ref 13.0–17.0)
Immature Granulocytes: 1 %
Lymphocytes Relative: 8 %
Lymphs Abs: 0.4 10*3/uL — ABNORMAL LOW (ref 0.7–4.0)
MCH: 31.6 pg (ref 26.0–34.0)
MCHC: 33.3 g/dL (ref 30.0–36.0)
MCV: 94.9 fL (ref 80.0–100.0)
Monocytes Absolute: 0.5 10*3/uL (ref 0.1–1.0)
Monocytes Relative: 11 %
Neutro Abs: 3.3 10*3/uL (ref 1.7–7.7)
Neutrophils Relative %: 77 %
Platelet Count: 204 10*3/uL (ref 150–400)
RBC: 3.54 MIL/uL — ABNORMAL LOW (ref 4.22–5.81)
RDW: 13.9 % (ref 11.5–15.5)
WBC Count: 4.3 10*3/uL (ref 4.0–10.5)
nRBC: 0 % (ref 0.0–0.2)

## 2022-11-01 LAB — CMP (CANCER CENTER ONLY)
ALT: 57 U/L — ABNORMAL HIGH (ref 0–44)
AST: 36 U/L (ref 15–41)
Albumin: 4 g/dL (ref 3.5–5.0)
Alkaline Phosphatase: 81 U/L (ref 38–126)
Anion gap: 6 (ref 5–15)
BUN: 27 mg/dL — ABNORMAL HIGH (ref 8–23)
CO2: 22 mmol/L (ref 22–32)
Calcium: 8.9 mg/dL (ref 8.9–10.3)
Chloride: 108 mmol/L (ref 98–111)
Creatinine: 1.3 mg/dL — ABNORMAL HIGH (ref 0.61–1.24)
GFR, Estimated: >60 mL/min (ref >60)
Glucose, Bld: 144 mg/dL — ABNORMAL HIGH (ref 70–99)
Potassium: 4 mmol/L (ref 3.5–5.1)
Sodium: 136 mmol/L (ref 135–145)
Total Bilirubin: 0.6 mg/dL (ref 0.3–1.2)
Total Protein: 6.9 g/dL (ref 6.5–8.1)

## 2022-11-01 MED ORDER — SODIUM CHLORIDE 0.9 % IV SOLN
Freq: Once | INTRAVENOUS | Status: AC
  Filled 2022-11-01: qty 250

## 2022-11-01 MED ORDER — SODIUM CHLORIDE 0.9 % IV SOLN
900.0000 mg/m2 | Freq: Once | INTRAVENOUS | Status: AC
  Administered 2022-11-01: 2090 mg via INTRAVENOUS
  Filled 2022-11-01: qty 54.97

## 2022-11-01 MED ORDER — LIDOCAINE-PRILOCAINE 2.5-2.5 % EX CREA
1.0000 | TOPICAL_CREAM | CUTANEOUS | 11 refills | Status: AC | PRN
Start: 2022-11-01 — End: ?

## 2022-11-01 MED ORDER — PROCHLORPERAZINE MALEATE 10 MG PO TABS
10.0000 mg | ORAL_TABLET | Freq: Once | ORAL | Status: AC
  Administered 2022-11-01: 10 mg via ORAL
  Filled 2022-11-01: qty 1

## 2022-11-01 MED ORDER — HEPARIN SOD (PORK) LOCK FLUSH 100 UNIT/ML IV SOLN
500.0000 [IU] | Freq: Once | INTRAVENOUS | Status: AC | PRN
  Administered 2022-11-01: 500 [IU]
  Filled 2022-11-01: qty 5

## 2022-11-01 NOTE — Assessment & Plan Note (Signed)
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  Hold ferrous sulfate 325mg daily as it changes his stool color. Iron panel showed ferritin is above 200. Hemoglobin has improved 

## 2022-11-01 NOTE — Patient Instructions (Signed)

## 2022-11-01 NOTE — Assessment & Plan Note (Signed)
Monitor ANC closely.  Neutropenia precaution.  

## 2022-11-01 NOTE — Assessment & Plan Note (Addendum)
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  S/p chemotherapy Xeloda 825mg /m2 BID+ concurrent RT Previously on adjuvant Xeloda [1000 mg/m2 BID for 14 of every 21 days] -dose reduced to 825mg /m2 BID due to skin rash, still not able to tolerate- discontinue.  switched to gemcitabine weekly, 2 weeks on, 1 week off, [adjustment due to cytopenia] Labs are reviewed and discussed with patient.  proceed with cycle 6 D1 gemcitabine -dose reduce to 900mg /m2 1 week lab gemcitabine.  He will be finishing adjuvant chemotherapy after this cycle. He has surveillance CT scan scheduled in September/October 2024 at [facility].

## 2022-11-01 NOTE — Assessment & Plan Note (Addendum)
Port flush every 6 to 8 weeks.

## 2022-11-01 NOTE — Progress Notes (Signed)
Hematology/Oncology Progress note [phone] [phone]    CHIEF COMPLAINTS/REASON FOR VISIT:  Follow up for melanoma, cholangiocarcinoma.    ASSESSMENT & PLAN:   Cancer Staging  Cholangiocarcinoma [facility]) Staging form: Intrahepatic Bile Duct, AJCC 8th Edition - Pathologic stage from 01/25/2022: Stage Unknown (pT1a, pNX, cM0) - Signed by Rickard Patience, MD on 02/21/2022  Malignant melanoma of overlapping sites [facility]) Staging form: Melanoma of the Skin, AJCC 8th Edition - Pathologic: Stage Unknown (rpTX, pN1b, cM0) - Signed by Rickard Patience, MD on 07/27/2020 - Pathologic: No stage assigned - Unsigned   Cholangiocarcinoma (HCC) T1a Nx Cholangiocarcinoma Pathology was reviewed and discussed with patient.  S/p chemotherapy Xeloda 825mg /m2 BID+ concurrent RT Previously on adjuvant Xeloda [1000 mg/m2 BID for 14 of every 21 days] -dose reduced to 825mg /m2 BID due to skin rash, still not able to tolerate- discontinue.  switched to gemcitabine weekly, 2 weeks on, 1 week off, [adjustment due to cytopenia] Labs are reviewed and discussed with patient.  proceed with cycle 6 D1 gemcitabine -dose reduce to 900mg /m2 1 week lab gemcitabine.  He will be finishing adjuvant chemotherapy after this cycle. He has surveillance CT scan scheduled in September/October 2024 at [facility].   Malignant melanoma of overlapping sites [facility]) Patient has no radiographic evidence of metastatic melanoma on recent CT scan done at [facility] immunotherapy. 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  Hold ferrous sulfate 325mg  daily as it changes his stool color. Iron panel showed ferritin is above 200. Hemoglobin has improved  Stage 3a chronic kidney disease (HCC) Encourage oral hydration and avoid nephrotoxins.      Chemotherapy induced neutropenia (HCC) Monitor ANC closely.  Neutropenia precaution.   Encounter for antineoplastic  chemotherapy Chemotherapy plan as listed above.   Port-A-Cath in place Port flush every 6 to 8 weeks.  Follow up in 2 months.  All questions were answered. The patient knows to call the clinic with any problems, questions or concerns.  Rickard Patience, MD, PhD [facility] Hematology Oncology 11/01/2022    HISTORY OF PRESENTING ILLNESS:   Edward Pitts is a  62 y.o.  male presents for recurrent malignant melanoma.   Oncology History  Malignant melanoma of overlapping sites [facility])  04/16/2019 Cancer Staging   Staging form: Melanoma of the Skin, AJCC 8th Edition - Pathologic: Stage Unknown (rpTX, pN1b, cM0) - Signed by Rickard Patience, MD on 07/27/2020 Stage prefix: Recurrence    04/23/2019 Initial Diagnosis   Malignant melanoma   -He has a history of left lower extremity melanoma in 2011, status post local excision -04/16/2019 patient underwent left groin mass resection  Resection pathology showed malignant melanoma, replacing a lymph node, with extracapsular extension, peripheral and deep margins involved.  Left inguinal contents, all 7 lymph nodes were negative for melanoma in the lymph nodes. Extranodal melanoma identified in lymphatic and interstitium between nodes -PDL1 80% TPS    07/07/2019 -  Radiation Therapy   status post adjuvant radiation.   07/23/2019 - 07/21/2021 Chemotherapy   Nivolumab q14d      06/29/2020 Imaging   CT chest abdomen pelvis showed stable postoperative appearance of the left groin.  No evidence of local recurrence.  No evidence of metastatic disease in the chest abdomen or pelvis.  Hepatic steatosis.  Stable subcentimeter fluid attenuation lesion of the lateral right lobe of the liver, likely benign cyst or hemangioma.  Coronary artery disease.  Aortic atherosclerosis   10/20/2020 Imaging   CT chest abdomen  pelvis showed stable postoperative/radiation appearance of the left groin.  No evidence of local recurrence/metastatic disease within the chest abdomen/pelvis.  Fatty  liver disease.  Diverticulosis without evidence of typhlitis.  Aortic atherosclerosis   03/10/2021 Imaging   MRI brain without contrast showed no definitive evidence of intracranial metastatic disease.  Study is limited by absence of intravenous contrast.     04/26/2021 Imaging   CT chest abdomen pelvis without contrast showed stable post operative changes of left groin with no evidence of recurrent disease.  No evidence of metastatic disease in the chest abdomen pelvis.  Aortic atherosclerosis   08/08/2021 - 08/16/2021 Hospital Admission    patient was hospitalized due to NSTEMI status post CABG x3.  He also had pacemaker The echocardiogram showed left ventricular ejection fraction of 65 to 70%,   09/15/2021 Imaging   CT chest abdomen pelvis w contrast  IMPRESSION: 1. Subtle hypodense 9 mm lesion in the left lobe of the liver is new from prior imaging including previous contrasted CT dating back to December 10, 2019, with the lesion appearing to equilibrate with background liver on delayed imaging sequence but is incompletely evaluated on this imaging study and technically nonspecific possibly reflecting a benign perfusional variant and while its appearance is not typical for that of a melanoma metastasis, it is not excluded on this examination. Suggest more definitive characterization by hepatic protocol MRI with and without contrast. 2. Stable postoperative changes in the left groin without evidence of local recurrent disease.3. No evidence of metastatic disease in the chest or pelvis.4.  Aortic Atherosclerosis (ICD10-I70.0).      09/23/2021 Imaging   Contrast-enhanced liver ultrasound Mildly hypoenhancing 2.2 cm mass in the posterior aspect of the left lobe of the liver with washout characteristics concerning for non hepatocellular malignancy, concerning for melanotic metastasis given history. The lesion is in an unfavorable location for percutaneous biopsy. Consider PET-CT for further  characterization   10/21/2021 -  Chemotherapy   Nivolumab q14d      11/10/2021 Imaging   MRI Brain w wo contrast Negative for metastatic disease to the brain   11/10/2021 Imaging   MRI abdomen w wo contrast 1. Lesion of the posterior superior left lobe of the liver, hepatic segment II, measuring 1.9 x 1.8 cm corresponding to findings of prior imaging. Evaluation is somewhat limited breath motion artifact however there is subtle associated rim enhancement of this lesion. Findings are most in keeping with a hepatic metastasis in the setting of known recurrent melanoma. 2. Mild hepatic steatosis. 3. Cardiomegaly.    02/27/2022 - 02/27/2022 Chemotherapy   Patient is on Treatment Plan : Capecitabine (825 mg/m2 bid) + XRT     07/12/2022 -  Chemotherapy   Patient is on Treatment Plan : PANCREAS Gemcitabine D1,8(1000) q21d     Cholangiocarcinoma (HCC)  12/19/2021 Initial Diagnosis   Liver biopsy showed carcinoma  -The slides on the patient's prior left inguinal lymph node biopsy ([id number]) with metastatic melanoma were reviewed in conjunction with  this case. The morphology of the malignant cells between the two cases are dissimilar. A limited panel of immunohistochemical stains was performed and the neoplastic cells are positive for superpancytokeratin, cytokeratin 7 (diffuse, strong), cytokeratin 20 (patchy, moderate) and negative for S100, SOX-10, and HepPar-1.These findings are consistent with carcinoma. The morphologic findings and pattern of immunohistochemical staining are non-specific and possible sites of origin include but are not limited to pancreaticobiliary tract, GI tract, prostate, kidney, lung, and breast. Per [facility], the patient had a  PET scan performed in July 2023 which demonstrated a single hepatic  hypermetabolic lesion without other sites of disease.    01/19/2022 Surgery   Liver lesion resection at [facility] by Dr.Zani  A.  Liver, segment 2, 3, 4A, partial hepatectomy:    Cholangiocarcinoma, moderately differentiated (2.5 cm, segment 2).  Tumor extends to hepatic parenchymal margin, but all other margins are negative for tumor. See synoptic report and comment.   Background liver with: Mild steatosis (20%).  No definitive evidence of steatohepatitis.   Mild periportal fibrosis (trichrome and reticulin). No stainable iron (Prussian blue stain).  No evidence to support alpha-1-antitrypsin deficiency on PAS-D stain.   pT1a pNx   01/25/2022 Cancer Staging   Staging form: Intrahepatic Bile Duct, AJCC 8th Edition - Pathologic stage from 01/25/2022: Stage Unknown (pT1a, pNX, cM0) - Signed by Rickard Patience, MD on 02/21/2022 Stage prefix: Initial diagnosis   03/23/2022 - 05/01/2022 Radiation Therapy   Concurrent Xeloda 825mg /m2 BID + Radiation.    05/08/2022 -  Chemotherapy   Xeloda [1000 mg/m2 BID for 14 of every 21 days], plan 4 months.   05/08/2022, cycle 1 Xeloda.  Xeloda was stopped on 05/19/22 due to developing rash on face as well as dorsum of hands. Treated with steroid,symptoms resolved.  3/4-3/13/2024  Xeloda was stopped early due to groin cellulitis, he also developed similar skin rash again  Plan 3/25 cycle 3 dose reduce Xeloda 825 mg/m2   2000mg  BID x 14 days.   05/09/2022 Imaging   CT chest abdomen pelvis with contrast at [facility] showed Status post partial left hepatectomy without CT evidence of recurrent or metastatic disease in the chest, abdomen, pelvis.     07/24/2022 - 07/26/2022 Hospital Admission   Patient was hospitalized due to symptomatic anemia, status post 1 unit of PRBC transfusion. Also neutropenia secondary to chemotherapy.  Nadir was 0.9.  Patient was afebrile.   09/05/2022 Imaging   CT chest abdomen pelvis with contrast at [facility]  Postsurgical changes of left hepatectomy without evidence of recurrent or  metastatic disease.     04/18/2021, patient establish care with neurology Dr. Barbaraann Cao for intermittent altered cognition.  he was recommended  to start Vimpat.    INTERVAL HISTORY Dalin Aagard is a 62 y.o. male who has above history reviewed by me today presents for follow up visit for management of inguinal nodal recurrence of melanoma, resected T1a cholangiocarcinoma. He reports feeling well, denies any weakness or lightheaded. No new complaints.  :Review of Systems  Constitutional:  Positive for fatigue. Negative for appetite change, chills, fever and unexpected weight change.  HENT:   Negative for hearing loss and voice change.   Eyes:  Negative for eye problems and icterus.  Respiratory:  Negative for chest tightness, cough and shortness of breath.   Cardiovascular:  Positive for leg swelling. Negative for chest pain.  Gastrointestinal:  Negative for abdominal distention and abdominal pain.  Endocrine: Negative for hot flashes.  Genitourinary:  Negative for difficulty urinating, dysuria and frequency.   Musculoskeletal:        Status post left hip replacement  Skin:  Negative for itching.       Skin hypo-pigmentation on upper extremities, no change   Neurological:  Negative for light-headedness and numbness.  Hematological:  Negative for adenopathy. Does not bruise/bleed easily.  Psychiatric/Behavioral:  Negative for confusion.        Grieving.     MEDICAL HISTORY:  Past Medical History:  Diagnosis Date   AKI (acute kidney injury) (  HCC) 08/24/2018   Anemia    iron treatments   Anxiety    Aortic atherosclerosis (HCC)    Arthritis    Cancer of groin (HCC) 2021   left groin, resected, radiation   Cataract    Complication of anesthesia    PONV   Coronary artery disease    Dizziness of unknown etiology    has led to seizures and passing out.   Family history of adverse reaction to anesthesia    PONV mother   GERD (gastroesophageal reflux disease)    H/O Malignant melanoma 09/18/2022   History of complete heart block    PPM placed   Hyperlipidemia    Hypertension    LBBB (left bundle branch block)     Lymphedema of left leg    uses thigh high compression stockings   Melanoma (HCC) 2012   skin cancer, left thigh   OSA on CPAP    PONV (postoperative nausea and vomiting) 04/16/2019   Port-A-Cath in place    RIGHT chest wall   Presence of cardiac pacemaker    [employer]   Problem related to unspecified psychosocial circumstances 09/18/2022   Seizures (HCC)    still has episodes of dizziness. last event 1 month ago (march 2022) and will pass out. takes clonazepam    SURGICAL HISTORY: Past Surgical History:  Procedure Laterality Date   CORONARY ARTERY BYPASS GRAFT N/A 08/10/2021   Procedure: CORONARY ARTERY BYPASS GRAFTING (CABG) X 3 USING LEFT INTERNAL MAMMARY ARTERY AND RIGHT GREATER SAPHENOUS VEIN;  Surgeon: Lovett Sox, MD;  Location: [facility] OR;  Service: Open Heart Surgery;  Laterality: N/A;   CT RADIATION THERAPY GUIDE     left groin   dental implant     permanent implant   ENDOVEIN HARVEST OF GREATER SAPHENOUS VEIN Right 08/10/2021   Procedure: ENDOVEIN HARVEST OF GREATER SAPHENOUS VEIN;  Surgeon: Lovett Sox, MD;  Location: [facility] OR;  Service: Open Heart Surgery;  Laterality: Right;   KNEE SURGERY Left    arthroscopy   LEFT HEART CATH AND CORONARY ANGIOGRAPHY Left 06/29/2017   Procedure: LEFT HEART CATH AND CORONARY ANGIOGRAPHY;  Surgeon: Lamar Blinks, MD;  Location: [facility] INVASIVE CV LAB;  Service: Cardiovascular;  Laterality: Left;   LEFT HEART CATH AND CORONARY ANGIOGRAPHY N/A 08/08/2021   Procedure: LEFT HEART CATH AND CORONARY ANGIOGRAPHY;  Surgeon: Lamar Blinks, MD;  Location: [facility] INVASIVE CV LAB;  Service: Cardiovascular;  Laterality: N/A;   LYMPH NODE DISSECTION Left 04/16/2019   Procedure: Left inguinal Lymph Node Dissection;  Surgeon: Almond Lint, MD;  Location: [facility] OR;  Service: General;  Laterality: Left;   MELANOMA EXCISION Left 04/16/2019   Procedure: MELANOMA EXCISION LEFT GROIN MASS;  Surgeon: Almond Lint, MD;  Location: [facility] OR;  Service: General;   Laterality: Left;   MELANOMA EXCISION WITH SENTINEL LYMPH NODE BIOPSY Left 2012   Left calf    PACEMAKER INSERTION N/A 08/26/2018   Procedure: INSERTION PACEMAKER;  Surgeon: Marcina Millard, MD;  Location: [facility] ORS;  Service: Cardiovascular;  Laterality: N/A;   PORTA CATH INSERTION N/A 08/26/2019   Procedure: PORTA CATH INSERTION;  Surgeon: Renford Dills, MD;  Location: [facility] INVASIVE CV LAB;  Service: Cardiovascular;  Laterality: N/A;   SUPERFICIAL LYMPH NODE BIOPSY / EXCISION Left 2020   lymph nodes removed around left groin melanoma site   TEE WITHOUT CARDIOVERSION N/A 08/10/2021   Procedure: TRANSESOPHAGEAL ECHOCARDIOGRAM (TEE);  Surgeon: Lovett Sox, MD;  Location: [facility] OR;  Service: Open Heart Surgery;  Laterality: N/A;   TEMPORARY PACEMAKER N/A 08/25/2018   Procedure: TEMPORARY PACEMAKER;  Surgeon: Tonny Bollman, MD;  Location: [facility] INVASIVE CV LAB;  Service: Cardiovascular;  Laterality: N/A;   TOTAL HIP ARTHROPLASTY Left 07/14/2020   Procedure: TOTAL HIP ARTHROPLASTY;  Surgeon: Donato Heinz, MD;  Location: [facility] ORS;  Service: Orthopedics;  Laterality: Left;    SOCIAL HISTORY: Social History   Socioeconomic History   Marital status: Married    Spouse name: [name]    Number of children: 7   Years of education: 12   Highest education level: Not on file  Occupational History    Comment: disability  Tobacco Use   Smoking status: Never   Smokeless tobacco: Never  Vaping Use   Vaping status: Never Used  Substance and Sexual Activity   Alcohol use: No   Drug use: No   Sexual activity: Not Currently  Other Topics Concern   Not on file  Social History Narrative   Lives with  Wife,   Has 2 small dogs   Caffeine use: sodas (2 per day)      Out of work on disability.  Has a walk in shower. No stairs to climb   Oncology treatment ongoing. Uses port a cath for treatment.      pacemaker   Social Determinants of Health   Financial Resource Strain: Low Risk   (02/12/2019)   Overall Financial Resource Strain (CARDIA)    Difficulty of Paying Living Expenses: Not hard at all  Food Insecurity: Food Insecurity Present (02/21/2022)   Hunger Vital Sign    Worried About Running Out of Food in the Last Year: Sometimes true    Ran Out of Food in the Last Year: Often true  Transportation Needs: No Transportation Needs (07/25/2022)   PRAPARE - Administrator, Civil Service (Medical): No    Lack of Transportation (Non-Medical): No  Physical Activity: Unknown (02/12/2019)   Exercise Vital Sign    Days of Exercise per Week: 0 days    Minutes of Exercise per Session: Not on file  Stress: No Stress Concern Present (02/12/2019)   [employer] of Occupational Health - Occupational Stress Questionnaire    Feeling of Stress : Only a little  Social Connections: Unknown (02/12/2019)   Social Connection and Isolation Panel [NHANES]    Frequency of Communication with Friends and Family: More than three times a week    Frequency of Social Gatherings with Friends and Family: Not on file    Attends Religious Services: Not on file    Active Member of Clubs or Organizations: Not on file    Attends Banker Meetings: Not on file    Marital Status: Married  Intimate Partner Violence: Not At Risk (07/25/2022)   Humiliation, Afraid, Rape, and Kick questionnaire    Fear of Current or Ex-Partner: No    Emotionally Abused: No    Physically Abused: No    Sexually Abused: No    FAMILY HISTORY: Family History  Problem Relation Age of Onset   Cancer Paternal Grandmother     ALLERGIES:  is allergic to ibuprofen, levetiracetam, nsaids, and capecitabine.  MEDICATIONS:  Current Outpatient Medications  Medication Sig Dispense Refill   ALPRAZolam (XANAX) 0.5 MG tablet Take 1 tablet (0.5 mg total) by mouth 2 (two) times daily as needed for anxiety. 30 tablet 0   apixaban (ELIQUIS) 5 MG TABS tablet Take 5 mg by mouth 2 (two) times daily.  atorvastatin (LIPITOR) 40 MG tablet Take 0.5 tablets by mouth at bedtime.     Carboxymeth-Glyc-Polysorb PF (REFRESH OPTIVE MEGA-3) 0.5-1-0.5 % SOLN Place 1 drop into both eyes daily as needed (dry eyes).     clonazePAM (KLONOPIN) 0.5 MG tablet TAKE 1 TABLET BY MOUTH IN THE MORNING AND 1 BY MOUTH IN THE EVENING PER PT 120 tablet 1   clotrimazole (CLOTRIMAZOLE ANTI-FUNGAL) 1 % cream Apply 1 Application topically 2 (two) times daily. Apply to the L inguinal fold 30 g 0   hydrocortisone 2.5 % ointment Apply 1 application. topically daily as needed (itching).     Lacosamide 100 MG TABS TAKE 1 TABLET (100 MG TOTAL) BY MOUTH IN THE MORNING AND AT BEDTIME. 60 tablet 3   lisinopril (ZESTRIL) 10 MG tablet Take 10 mg by mouth daily.     metoprolol succinate (TOPROL-XL) 25 MG 24 hr tablet Take 25 mg by mouth daily.     Multiple Vitamin (MULTIVITAMIN WITH MINERALS) TABS tablet Take 1 tablet by mouth daily. Centrum Silver     nystatin (MYCOSTATIN/NYSTOP) powder Apply 1 Application topically 3 (three) times daily. 60 g 1   omeprazole (PRILOSEC) 20 MG capsule TAKE 1 CAPSULE BY MOUTH EVERY DAY 90 capsule 0   ondansetron (ZOFRAN) 4 MG tablet TAKE 1 TABLET BY MOUTH EVERY 8 HOURS AS NEEDED FOR NAUSEA AND VOMITING 90 tablet 1   lidocaine-prilocaine (EMLA) cream Apply 1 Application topically as needed. Apply small amount of cream to port site approx 1-2 hours prior to appointment. 30 g 11   No current facility-administered medications for this visit.   Facility-Administered Medications Ordered in Other Visits  Medication Dose Route Frequency Provider Last Rate Last Admin   0.9 %  sodium chloride infusion   Intravenous Once Rickard Patience, MD       gemcitabine (GEMZAR) 2,090 mg in sodium chloride 0.9 % 250 mL chemo infusion  900 mg/m2 (Treatment Plan Recorded) Intravenous Once Rickard Patience, MD       heparin lock flush 100 UNIT/ML injection            heparin lock flush 100 UNIT/ML injection            heparin lock flush 100  unit/mL  500 Units Intracatheter Once PRN Rickard Patience, MD       prochlorperazine (COMPAZINE) tablet 10 mg  10 mg Oral Once Rickard Patience, MD         PHYSICAL EXAMINATION: ECOG PERFORMANCE STATUS: 1 - Symptomatic but completely ambulatory Vitals:   11/01/22 0856  BP: (!) 146/65  Pulse: 86  Resp: 18  Temp: (!) 97 F (36.1 C)  SpO2: 97%   Filed Weights   11/01/22 0856  Weight: 254 lb 4.8 oz (115.3 kg)    Physical Exam Constitutional:      General: He is not in acute distress.    Comments: Patient ambulates independently  HENT:     Head: Normocephalic and atraumatic.  Eyes:     General: No scleral icterus.    Pupils: Pupils are equal, round, and reactive to light.  Cardiovascular:     Rate and Rhythm: Normal rate and regular rhythm.     Heart sounds: Normal heart sounds.  Pulmonary:     Effort: Pulmonary effort is normal. No respiratory distress.     Breath sounds: No wheezing.  Abdominal:     General: Bowel sounds are normal. There is no distension.     Palpations: Abdomen is soft. There is no mass.  Tenderness: There is no abdominal tenderness.     Comments:    Musculoskeletal:        General: No deformity. Normal range of motion.     Cervical back: Normal range of motion and neck supple.     Comments: Left lower extremity edema-chronic   Skin:    General: Skin is warm and dry.     Comments: Bilateral dorsum had raised erythematous rash-improved Left groin chronic edema with erythematous changes.-Improved with residual erythema  Neurological:     Mental Status: He is alert and oriented to person, place, and time. Mental status is at baseline.     Cranial Nerves: No cranial nerve deficit.     Coordination: Coordination normal.  Psychiatric:        Mood and Affect: Mood normal.     LABORATORY DATA:  I have reviewed the data as listed    Latest Ref Rng & Units 11/01/2022    8:47 AM 10/18/2022    9:02 AM 10/11/2022    9:34 AM  CBC  WBC 4.0 - 10.5 K/uL 4.3  4.6   1.9   Hemoglobin 13.0 - 17.0 g/dL 29.5  28.4  13.2   Hematocrit 39.0 - 52.0 % 33.6  35.3  30.1   Platelets 150 - 400 K/uL 204  104  262       Latest Ref Rng & Units 11/01/2022    8:47 AM 10/18/2022    9:02 AM 10/11/2022    9:34 AM  CMP  Glucose 70 - 99 mg/dL 440  102  725   BUN 8 - 23 mg/dL 27  19  20    Creatinine 0.61 - 1.24 mg/dL 3.66  4.40  3.47   Sodium 135 - 145 mmol/L 136  136  137   Potassium 3.5 - 5.1 mmol/L 4.0  4.0  3.8   Chloride 98 - 111 mmol/L 108  106  107   CO2 22 - 32 mmol/L 22  25  24    Calcium 8.9 - 10.3 mg/dL 8.9  9.0  8.8   Total Protein 6.5 - 8.1 g/dL 6.9  7.0  6.8   Total Bilirubin 0.3 - 1.2 mg/dL 0.6  0.5  0.4   Alkaline Phos 38 - 126 U/L 81  83  80   AST 15 - 41 U/L 36  46  46   ALT 0 - 44 U/L 57  71  86      RADIOGRAPHIC STUDIES: I have personally reviewed the radiological images as listed and agreed with the findings in the report. No results found.

## 2022-11-01 NOTE — Assessment & Plan Note (Signed)
Chemotherapy plan as listed above 

## 2022-11-01 NOTE — Assessment & Plan Note (Signed)
Patient has no radiographic evidence of metastatic melanoma on recent CT scan done at [facility] immunotherapy. Continue surveillance 

## 2022-11-01 NOTE — Assessment & Plan Note (Signed)
Encourage oral hydration and avoid nephrotoxins.   

## 2022-11-06 ENCOUNTER — Ambulatory Visit
Admission: RE | Admit: 2022-11-06 | Discharge: 2022-11-06 | Disposition: A | Discharge status: Home / Self Care | Payer: 59 | Source: Ambulatory Visit | Attending: Radiation Oncology | Admitting: Radiation Oncology

## 2022-11-06 ENCOUNTER — Encounter: Payer: Self-pay | Admitting: Radiation Oncology

## 2022-11-06 VITALS — BP 122/70 | HR 79 | Temp 98.0°F | Resp 16 | Wt 254.0 lb

## 2022-11-06 DIAGNOSIS — C221 Intrahepatic bile duct carcinoma: Secondary | ICD-10-CM | POA: Diagnosis not present

## 2022-11-06 DIAGNOSIS — Z85820 Personal history of malignant melanoma of skin: Secondary | ICD-10-CM | POA: Insufficient documentation

## 2022-11-06 DIAGNOSIS — Z923 Personal history of irradiation: Secondary | ICD-10-CM | POA: Diagnosis not present

## 2022-11-06 NOTE — Progress Notes (Signed)
Radiation Oncology Follow up Note  Name: Edward Pitts   Date:   11/06/2022 MRN:  [id number] DOB: [birth year]    This 62 y.o. male presents to the clinic today for 17-month follow-up status post adjuvant radiation therapy to his liver bed for stage I cholangiocarcinoma in patient previously treated with left groin for resected large area of metastatic malignant melanoma.  REFERRING PROVIDER: Miki Kins, FNP  HPI: Patient is a 62 year old male now out 6 months having completed adjuvant radiation therapy to his surgical bed for resection of a stage I cholangiocarcinoma.   Reated to his left groin for resected large area of metastatic melanoma.. Also been t he is currently on gemcitabine therapy finishing his last cycle.  Patient has a PET scan ordered by Dr. Cathie Hoops.  He is being followed for anemia. COMPLICATIONS OF TREATMENT: none  FOLLOW UP COMPLIANCE: keeps appointments   PHYSICAL EXAM:  BP 122/70   Pulse 79   Temp 98 F (36.7 C) (Tympanic)   Resp 16   Wt 254 lb (115.2 kg)   BMI 39.78 kg/m  Well-developed well-nourished patient in NAD. HEENT reveals PERLA, EOMI, discs not visualized.  Oral cavity is clear. No oral mucosal lesions are identified. Neck is clear without evidence of cervical or supraclavicular adenopathy. Lungs are clear to A&P. Cardiac examination is essentially unremarkable with regular rate and rhythm without murmur rub or thrill. Abdomen is benign with no organomegaly or masses noted. Motor sensory and DTR levels are equal and symmetric in the upper and lower extremities. Cranial nerves II through XII are grossly intact. Proprioception is intact. No peripheral adenopathy or edema is identified. No motor or sensory levels are noted. Crude visual fields are within normal range.  RADIOLOGY RESULTS: No current films for review  PLAN: At the present time patient is doing well seems to have stable disease at this time.  I will turn follow-up care over to medical oncology.  Be  happy to reevaluate patient at time should further palliative or adjuvant treatment be needed.  Patient is to call with any concerns.  I would like to take this opportunity to thank you for allowing me to participate in the care of your patient.Carmina Miller, MD

## 2022-11-08 ENCOUNTER — Inpatient Hospital Stay: Payer: 59

## 2022-11-08 VITALS — BP 142/63 | HR 63 | Temp 97.9°F | Resp 18

## 2022-11-08 DIAGNOSIS — Z5111 Encounter for antineoplastic chemotherapy: Secondary | ICD-10-CM | POA: Diagnosis not present

## 2022-11-08 DIAGNOSIS — C438 Malignant melanoma of overlapping sites of skin: Secondary | ICD-10-CM

## 2022-11-08 DIAGNOSIS — C221 Intrahepatic bile duct carcinoma: Secondary | ICD-10-CM | POA: Diagnosis not present

## 2022-11-08 DIAGNOSIS — D701 Agranulocytosis secondary to cancer chemotherapy: Secondary | ICD-10-CM | POA: Diagnosis not present

## 2022-11-08 DIAGNOSIS — D631 Anemia in chronic kidney disease: Secondary | ICD-10-CM | POA: Diagnosis not present

## 2022-11-08 DIAGNOSIS — Z853 Personal history of malignant neoplasm of breast: Secondary | ICD-10-CM | POA: Diagnosis not present

## 2022-11-08 DIAGNOSIS — N1831 Chronic kidney disease, stage 3a: Secondary | ICD-10-CM | POA: Diagnosis not present

## 2022-11-08 LAB — CMP (CANCER CENTER ONLY)
ALT: 81 U/L — ABNORMAL HIGH (ref 0–44)
AST: 49 U/L — ABNORMAL HIGH (ref 15–41)
Albumin: 3.9 g/dL (ref 3.5–5.0)
Alkaline Phosphatase: 80 U/L (ref 38–126)
Anion gap: 6 (ref 5–15)
BUN: 20 mg/dL (ref 8–23)
CO2: 21 mmol/L — ABNORMAL LOW (ref 22–32)
Calcium: 9 mg/dL (ref 8.9–10.3)
Chloride: 109 mmol/L (ref 98–111)
Creatinine: 1.06 mg/dL (ref 0.61–1.24)
GFR, Estimated: >60 mL/min (ref >60)
Glucose, Bld: 166 mg/dL — ABNORMAL HIGH (ref 70–99)
Potassium: 3.9 mmol/L (ref 3.5–5.1)
Sodium: 136 mmol/L (ref 135–145)
Total Bilirubin: 0.4 mg/dL (ref 0.3–1.2)
Total Protein: 6.8 g/dL (ref 6.5–8.1)

## 2022-11-08 LAB — CBC WITH DIFFERENTIAL (CANCER CENTER ONLY)
Abs Immature Granulocytes: 0.05 10*3/uL (ref 0.00–0.07)
Basophils Absolute: 0 10*3/uL (ref 0.0–0.1)
Basophils Relative: 1 %
Eosinophils Absolute: 0 10*3/uL (ref 0.0–0.5)
Eosinophils Relative: 2 %
HCT: 30.2 % — ABNORMAL LOW (ref 39.0–52.0)
Hemoglobin: 10.2 g/dL — ABNORMAL LOW (ref 13.0–17.0)
Immature Granulocytes: 3 %
Lymphocytes Relative: 21 %
Lymphs Abs: 0.4 10*3/uL — ABNORMAL LOW (ref 0.7–4.0)
MCH: 32.3 pg (ref 26.0–34.0)
MCHC: 33.8 g/dL (ref 30.0–36.0)
MCV: 95.6 fL (ref 80.0–100.0)
Monocytes Absolute: 0.3 10*3/uL (ref 0.1–1.0)
Monocytes Relative: 17 %
Neutro Abs: 1 10*3/uL — ABNORMAL LOW (ref 1.7–7.7)
Neutrophils Relative %: 56 %
Platelet Count: 206 10*3/uL (ref 150–400)
RBC: 3.16 MIL/uL — ABNORMAL LOW (ref 4.22–5.81)
RDW: 13.2 % (ref 11.5–15.5)
WBC Count: 1.8 10*3/uL — ABNORMAL LOW (ref 4.0–10.5)
nRBC: 0 % (ref 0.0–0.2)

## 2022-11-08 MED ORDER — SODIUM CHLORIDE 0.9 % IV SOLN
Freq: Once | INTRAVENOUS | Status: AC
  Filled 2022-11-08: qty 250

## 2022-11-08 MED ORDER — SODIUM CHLORIDE 0.9 % IV SOLN
Freq: Once | INTRAVENOUS | Status: DC
  Filled 2022-11-08: qty 250

## 2022-11-08 MED ORDER — HEPARIN SOD (PORK) LOCK FLUSH 100 UNIT/ML IV SOLN
500.0000 [IU] | Freq: Once | INTRAVENOUS | Status: AC | PRN
  Administered 2022-11-08: 500 [IU]
  Filled 2022-11-08: qty 5

## 2022-11-08 MED ORDER — SODIUM CHLORIDE 0.9 % IV SOLN
900.0000 mg/m2 | Freq: Once | INTRAVENOUS | Status: AC
  Administered 2022-11-08: 2090 mg via INTRAVENOUS
  Filled 2022-11-08: qty 2.36

## 2022-11-08 MED ORDER — PROCHLORPERAZINE MALEATE 10 MG PO TABS
10.0000 mg | ORAL_TABLET | Freq: Once | ORAL | Status: AC
  Administered 2022-11-08: 10 mg via ORAL
  Filled 2022-11-08: qty 1

## 2022-11-08 NOTE — Patient Instructions (Signed)
[facility]  Discharge Instructions: Thank you for choosing [facility] to provide your oncology and hematology care.  If you have a lab appointment with the [facility], please go directly to the [facility] and check in at the registration area.  Wear comfortable clothing and clothing appropriate for easy access to any Portacath or PICC line.   We strive to give you quality time with your provider. You may need to reschedule your appointment if you arrive late (15 or more minutes).  Arriving late affects you and other patients whose appointments are after yours.  Also, if you miss three or more appointments without notifying the office, you may be dismissed from the clinic at the provider's discretion.      For prescription refill requests, have your pharmacy contact our office and allow 72 hours for refills to be completed.    Today you received the following chemotherapy and/or immunotherapy agents gemzar    To help prevent nausea and vomiting after your treatment, we encourage you to take your nausea medication as directed.  BELOW ARE SYMPTOMS THAT SHOULD BE REPORTED IMMEDIATELY: *FEVER GREATER THAN 100.4 F (38 C) OR HIGHER *CHILLS OR SWEATING *NAUSEA AND VOMITING THAT IS NOT CONTROLLED WITH YOUR NAUSEA MEDICATION *UNUSUAL SHORTNESS OF BREATH *UNUSUAL BRUISING OR BLEEDING *URINARY PROBLEMS (pain or burning when urinating, or frequent urination) *BOWEL PROBLEMS (unusual diarrhea, constipation, pain near the anus) TENDERNESS IN MOUTH AND THROAT WITH OR WITHOUT PRESENCE OF ULCERS (sore throat, sores in mouth, or a toothache) UNUSUAL RASH, SWELLING OR PAIN  UNUSUAL VAGINAL DISCHARGE OR ITCHING   Items with * indicate a potential emergency and should be followed up as soon as possible or go to the Emergency Department if any problems should occur.  Please show the CHEMOTHERAPY ALERT CARD or IMMUNOTHERAPY ALERT CARD at check-in to the  Emergency Department and triage nurse.  Should you have questions after your visit or need to cancel or reschedule your appointment, please contact [facility]  [phone] and follow the prompts.  Office hours are 8:00 a.m. to 4:30 p.m. Monday - Friday. Please note that voicemails left after 4:00 p.m. may not be returned until the following business day.  We are closed weekends and major holidays. You have access to a nurse at all times for urgent questions. Please call the main number to the clinic [phone] and follow the prompts.  For any non-urgent questions, you may also contact your provider using MyChart. We now offer e-Visits for anyone 63 and older to request care online for non-urgent symptoms. For details visit [email or web address].   Also download the MyChart app! Go to the app store, search "MyChart", open the app, select [facility], and log in with your MyChart username and password.

## 2022-12-08 ENCOUNTER — Ambulatory Visit: Payer: 59 | Admitting: Internal Medicine

## 2022-12-15 ENCOUNTER — Inpatient Hospital Stay: Payer: 59 | Attending: Oncology | Admitting: Internal Medicine

## 2022-12-15 ENCOUNTER — Encounter: Payer: Self-pay | Admitting: Internal Medicine

## 2022-12-15 VITALS — BP 132/54 | HR 71 | Temp 96.6°F | Resp 19 | Wt 253.1 lb

## 2022-12-15 DIAGNOSIS — C221 Intrahepatic bile duct carcinoma: Secondary | ICD-10-CM | POA: Insufficient documentation

## 2022-12-15 DIAGNOSIS — Z79899 Other long term (current) drug therapy: Secondary | ICD-10-CM | POA: Insufficient documentation

## 2022-12-15 DIAGNOSIS — G40909 Epilepsy, unspecified, not intractable, without status epilepticus: Secondary | ICD-10-CM | POA: Diagnosis not present

## 2022-12-15 DIAGNOSIS — R569 Unspecified convulsions: Secondary | ICD-10-CM

## 2022-12-15 DIAGNOSIS — Z7901 Long term (current) use of anticoagulants: Secondary | ICD-10-CM | POA: Diagnosis not present

## 2022-12-15 DIAGNOSIS — Z923 Personal history of irradiation: Secondary | ICD-10-CM | POA: Diagnosis not present

## 2022-12-15 DIAGNOSIS — Z9221 Personal history of antineoplastic chemotherapy: Secondary | ICD-10-CM | POA: Diagnosis not present

## 2022-12-15 MED ORDER — LACOSAMIDE 100 MG PO TABS: 100.0000 mg | ORAL_TABLET | Freq: Two times a day (BID) | ORAL | 3 refills | Status: DC

## 2022-12-15 MED ORDER — CLONAZEPAM 0.5 MG PO TABS: ORAL_TABLET | ORAL | 1 refills | Status: DC

## 2022-12-15 NOTE — Progress Notes (Signed)
[facility] [address]  [place], [place] [address] [phone]   Interval Evaluation  Date of Service: 12/15/22 Patient Name: Edward Pitts Patient MRN: [id number] Patient DOB: [birth year] Provider: Henreitta Leber, MD  Identifying Statement:  Halton Soliman is a 62 y.o. male with suspected seizures  Primary Cancer:  Oncologic History: Oncology History  Malignant melanoma of overlapping sites [facility])  04/16/2019 Cancer Staging   Staging form: Melanoma of the Skin, AJCC 8th Edition - Pathologic: Stage Unknown (rpTX, pN1b, cM0) - Signed by Rickard Patience, MD on 07/27/2020 Stage prefix: Recurrence    04/23/2019 Initial Diagnosis   Malignant melanoma   -He has a history of left lower extremity melanoma in 2011, status post local excision -04/16/2019 patient underwent left groin mass resection  Resection pathology showed malignant melanoma, replacing a lymph node, with extracapsular extension, peripheral and deep margins involved.  Left inguinal contents, all 7 lymph nodes were negative for melanoma in the lymph nodes. Extranodal melanoma identified in lymphatic and interstitium between nodes -PDL1 80% TPS    07/07/2019 -  Radiation Therapy   status post adjuvant radiation.   07/23/2019 - 07/21/2021 Chemotherapy   Nivolumab q14d      06/29/2020 Imaging   CT chest abdomen pelvis showed stable postoperative appearance of the left groin.  No evidence of local recurrence.  No evidence of metastatic disease in the chest abdomen or pelvis.  Hepatic steatosis.  Stable subcentimeter fluid attenuation lesion of the lateral right lobe of the liver, likely benign cyst or hemangioma.  Coronary artery disease.  Aortic atherosclerosis   10/20/2020 Imaging   CT chest abdomen pelvis showed stable postoperative/radiation appearance of the left groin.  No evidence of local recurrence/metastatic disease within the chest abdomen/pelvis.  Fatty liver disease.  Diverticulosis  without evidence of typhlitis.  Aortic atherosclerosis   03/10/2021 Imaging   MRI brain without contrast showed no definitive evidence of intracranial metastatic disease.  Study is limited by absence of intravenous contrast.     04/26/2021 Imaging   CT chest abdomen pelvis without contrast showed stable post operative changes of left groin with no evidence of recurrent disease.  No evidence of metastatic disease in the chest abdomen pelvis.  Aortic atherosclerosis   08/08/2021 - 08/16/2021 Hospital Admission    patient was hospitalized due to NSTEMI status post CABG x3.  He also had pacemaker The echocardiogram showed left ventricular ejection fraction of 65 to 70%,   09/15/2021 Imaging   CT chest abdomen pelvis w contrast  IMPRESSION: 1. Subtle hypodense 9 mm lesion in the left lobe of the liver is new from prior imaging including previous contrasted CT dating back to December 10, 2019, with the lesion appearing to equilibrate with background liver on delayed imaging sequence but is incompletely evaluated on this imaging study and technically nonspecific possibly reflecting a benign perfusional variant and while its appearance is not typical for that of a melanoma metastasis, it is not excluded on this examination. Suggest more definitive characterization by hepatic protocol MRI with and without contrast. 2. Stable postoperative changes in the left groin without evidence of local recurrent disease.3. No evidence of metastatic disease in the chest or pelvis.4.  Aortic Atherosclerosis (ICD10-I70.0).      09/23/2021 Imaging   Contrast-enhanced liver ultrasound Mildly hypoenhancing 2.2 cm mass in the posterior aspect of the left lobe of the liver with washout characteristics concerning for non hepatocellular malignancy, concerning for melanotic metastasis given history. The lesion  is in an unfavorable location for percutaneous biopsy. Consider PET-CT for further characterization   10/21/2021 -   Chemotherapy   Nivolumab q14d      11/10/2021 Imaging   MRI Brain w wo contrast Negative for metastatic disease to the brain   11/10/2021 Imaging   MRI abdomen w wo contrast 1. Lesion of the posterior superior left lobe of the liver, hepatic segment II, measuring 1.9 x 1.8 cm corresponding to findings of prior imaging. Evaluation is somewhat limited breath motion artifact however there is subtle associated rim enhancement of this lesion. Findings are most in keeping with a hepatic metastasis in the setting of known recurrent melanoma. 2. Mild hepatic steatosis. 3. Cardiomegaly.    02/27/2022 - 02/27/2022 Chemotherapy   Patient is on Treatment Plan : Capecitabine (825 mg/m2 bid) + XRT     07/12/2022 -  Chemotherapy   Patient is on Treatment Plan : PANCREAS Gemcitabine D1,8(1000) q21d     Cholangiocarcinoma (HCC)  12/19/2021 Initial Diagnosis   Liver biopsy showed carcinoma  -The slides on the patient's prior left inguinal lymph node biopsy ([id number]) with metastatic melanoma were reviewed in conjunction with  this case. The morphology of the malignant cells between the two cases are dissimilar. A limited panel of immunohistochemical stains was performed and the neoplastic cells are positive for superpancytokeratin, cytokeratin 7 (diffuse, strong), cytokeratin 20 (patchy, moderate) and negative for S100, SOX-10, and HepPar-1.These findings are consistent with carcinoma. The morphologic findings and pattern of immunohistochemical staining are non-specific and possible sites of origin include but are not limited to pancreaticobiliary tract, GI tract, prostate, kidney, lung, and breast. Per [facility], the patient had a PET scan performed in July 2023 which demonstrated a single hepatic  hypermetabolic lesion without other sites of disease.    01/19/2022 Surgery   Liver lesion resection at [facility] by Dr.Zani  A.  Liver, segment 2, 3, 4A, partial hepatectomy:   Cholangiocarcinoma, moderately differentiated  (2.5 cm, segment 2).  Tumor extends to hepatic parenchymal margin, but all other margins are negative for tumor. See synoptic report and comment.   Background liver with: Mild steatosis (20%).  No definitive evidence of steatohepatitis.   Mild periportal fibrosis (trichrome and reticulin). No stainable iron (Prussian blue stain).  No evidence to support alpha-1-antitrypsin deficiency on PAS-D stain.   pT1a pNx   01/25/2022 Cancer Staging   Staging form: Intrahepatic Bile Duct, AJCC 8th Edition - Pathologic stage from 01/25/2022: Stage Unknown (pT1a, pNX, cM0) - Signed by Rickard Patience, MD on 02/21/2022 Stage prefix: Initial diagnosis   03/23/2022 - 05/01/2022 Radiation Therapy   Concurrent Xeloda 825mg /m2 BID + Radiation.    05/08/2022 -  Chemotherapy   Xeloda [1000 mg/m2 BID for 14 of every 21 days], plan 4 months.   05/08/2022, cycle 1 Xeloda.  Xeloda was stopped on 05/19/22 due to developing rash on face as well as dorsum of hands. Treated with steroid,symptoms resolved.  3/4-3/13/2024  Xeloda was stopped early due to groin cellulitis, he also developed similar skin rash again  Plan 3/25 cycle 3 dose reduce Xeloda 825 mg/m2   2000mg  BID x 14 days.   05/09/2022 Imaging   CT chest abdomen pelvis with contrast at [facility] showed Status post partial left hepatectomy without CT evidence of recurrent or metastatic disease in the chest, abdomen, pelvis.     07/24/2022 - 07/26/2022 Hospital Admission   Patient was hospitalized due to symptomatic anemia, status post 1 unit of PRBC transfusion. Also neutropenia secondary to chemotherapy.  Nadir was 0.9.  Patient was afebrile.   09/05/2022 Imaging   CT chest abdomen pelvis with contrast at [facility]  Postsurgical changes of left hepatectomy without evidence of recurrent or  metastatic disease.      Interval History: Luigi Lorman presents for clinical follow up.  Still describes occasional brief seizures, described as blurry vision, head spinning,  confusion.  Frequency is currently less than once per month.  Has used some rescue Klonopin in addition to standing dose.  No longer on chemotherapy with Dr. Cathie Hoops at this time.  Denies headaches.  H+P (04/15/21) Patient presents for follow up given neurologic complaints.  He has seen neurologist in [place] for several years, relevant history is obtained from review of prior records.  In short, he experiences paroxysmal episodes of "head in a vice, disconnected, in a daze, can't understand language".  Episodes started in 2019, last 1-2 minutes, and occur 2-3x per month.  He thinks frequency decreased when Clonazepam was started by Dr. Anne Hahn in 2019.  He had normal EEG at that time.  Recently was given a trial of Keppra, but he experienced significant dizziness and stopped it.  Had MRI brain without contrast done recently through Dr. Cathie Hoops.  Continues on nivolumab infusions for melanoma.   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  Medications: Current Outpatient Medications on File Prior to Visit  Medication Sig Dispense Refill   ALPRAZolam (XANAX) 0.5 MG tablet Take 1 tablet (0.5 mg total) by mouth 2 (two) times daily as needed for anxiety. 30 tablet 0   apixaban (ELIQUIS) 5 MG TABS tablet Take 5 mg by mouth 2 (two) times daily.     atorvastatin (LIPITOR) 40 MG tablet Take 0.5 tablets by mouth at bedtime.     Carboxymeth-Glyc-Polysorb PF (REFRESH OPTIVE MEGA-3) 0.5-1-0.5 % SOLN Place 1 drop into both eyes daily as needed (dry eyes).     clonazePAM (KLONOPIN) 0.5 MG tablet TAKE 1 TABLET BY MOUTH IN THE MORNING AND 1 BY MOUTH IN THE EVENING PER PT 120 tablet 1   clotrimazole (CLOTRIMAZOLE ANTI-FUNGAL) 1 % cream Apply 1 Application topically 2 (two) times daily. Apply to the L inguinal fold 30 g 0   hydrocortisone 2.5 % ointment Apply 1 application. topically daily as needed (itching).     Lacosamide  100 MG TABS TAKE 1 TABLET (100 MG TOTAL) BY MOUTH IN THE MORNING AND AT BEDTIME. 60 tablet 3   lidocaine-prilocaine (EMLA) cream Apply 1 Application topically as needed. Apply small amount of cream to port site approx 1-2 hours prior to appointment. 30 g 11   lisinopril (ZESTRIL) 10 MG tablet Take 10 mg by mouth daily.     metoprolol succinate (TOPROL-XL) 25 MG 24 hr tablet Take 25 mg by mouth daily.     Multiple Vitamin (MULTIVITAMIN WITH MINERALS) TABS tablet Take 1 tablet by mouth daily. Centrum Silver     nystatin (MYCOSTATIN/NYSTOP) powder Apply 1 Application topically 3 (three) times daily. 60 g 1   omeprazole (PRILOSEC) 20 MG capsule TAKE 1 CAPSULE BY MOUTH EVERY DAY 90 capsule 0   ondansetron (ZOFRAN) 4 MG tablet TAKE 1 TABLET BY MOUTH EVERY 8 HOURS AS NEEDED FOR NAUSEA AND VOMITING 90 tablet 1   Current Facility-Administered Medications on File Prior to Visit  Medication Dose Route Frequency Provider Last Rate Last Admin   heparin lock flush 100 UNIT/ML injection  heparin lock flush 100 UNIT/ML injection             Allergies:  Allergies  Allergen Reactions   Ibuprofen Other (See Comments)    Affects kidneys   Levetiracetam Rash    Other reaction(s): Dizziness, Headache  Other Reaction(s): Dizziness, Headache, Other (see comments), Unknown  Dizziness & headaches   Nsaids     Affects kidneys   Capecitabine Rash    Other Reaction(s): Eruption of skin   Past Medical History:  Past Medical History:  Diagnosis Date   AKI (acute kidney injury) (HCC) 08/24/2018   Anemia    iron treatments   Anxiety    Aortic atherosclerosis (HCC)    Arthritis    Cancer of groin (HCC) 2021   left groin, resected, radiation   Cataract    Complication of anesthesia    PONV   Coronary artery disease    Dizziness of unknown etiology    has led to seizures and passing out.   Family history of adverse reaction to anesthesia    PONV mother   GERD (gastroesophageal reflux disease)     H/O Malignant melanoma 09/18/2022   History of complete heart block    PPM placed   Hyperlipidemia    Hypertension    LBBB (left bundle branch block)    Lymphedema of left leg    uses thigh high compression stockings   Melanoma (HCC) 2012   skin cancer, left thigh   OSA on CPAP    PONV (postoperative nausea and vomiting) 04/16/2019   Port-A-Cath in place    RIGHT chest wall   Presence of cardiac pacemaker    [employer]   Problem related to unspecified psychosocial circumstances 09/18/2022   Seizures (HCC)    still has episodes of dizziness. last event 1 month ago (march 2022) and will pass out. takes clonazepam   Past Surgical History:  Past Surgical History:  Procedure Laterality Date   CORONARY ARTERY BYPASS GRAFT N/A 08/10/2021   Procedure: CORONARY ARTERY BYPASS GRAFTING (CABG) X 3 USING LEFT INTERNAL MAMMARY ARTERY AND RIGHT GREATER SAPHENOUS VEIN;  Surgeon: Lovett Sox, MD;  Location: [facility] OR;  Service: Open Heart Surgery;  Laterality: N/A;   CT RADIATION THERAPY GUIDE     left groin   dental implant     permanent implant   ENDOVEIN HARVEST OF GREATER SAPHENOUS VEIN Right 08/10/2021   Procedure: ENDOVEIN HARVEST OF GREATER SAPHENOUS VEIN;  Surgeon: Lovett Sox, MD;  Location: [facility] OR;  Service: Open Heart Surgery;  Laterality: Right;   KNEE SURGERY Left    arthroscopy   LEFT HEART CATH AND CORONARY ANGIOGRAPHY Left 06/29/2017   Procedure: LEFT HEART CATH AND CORONARY ANGIOGRAPHY;  Surgeon: Lamar Blinks, MD;  Location: [facility] INVASIVE CV LAB;  Service: Cardiovascular;  Laterality: Left;   LEFT HEART CATH AND CORONARY ANGIOGRAPHY N/A 08/08/2021   Procedure: LEFT HEART CATH AND CORONARY ANGIOGRAPHY;  Surgeon: Lamar Blinks, MD;  Location: [facility] INVASIVE CV LAB;  Service: Cardiovascular;  Laterality: N/A;   LYMPH NODE DISSECTION Left 04/16/2019   Procedure: Left inguinal Lymph Node Dissection;  Surgeon: Almond Lint, MD;  Location: [facility] OR;  Service: General;   Laterality: Left;   MELANOMA EXCISION Left 04/16/2019   Procedure: MELANOMA EXCISION LEFT GROIN MASS;  Surgeon: Almond Lint, MD;  Location: [facility] OR;  Service: General;  Laterality: Left;   MELANOMA EXCISION WITH SENTINEL LYMPH NODE BIOPSY Left 2012   Left calf    PACEMAKER INSERTION N/A  08/26/2018   Procedure: INSERTION PACEMAKER;  Surgeon: Marcina Millard, MD;  Location: [facility] ORS;  Service: Cardiovascular;  Laterality: N/A;   PORTA CATH INSERTION N/A 08/26/2019   Procedure: PORTA CATH INSERTION;  Surgeon: Renford Dills, MD;  Location: [facility] INVASIVE CV LAB;  Service: Cardiovascular;  Laterality: N/A;   SUPERFICIAL LYMPH NODE BIOPSY / EXCISION Left 2020   lymph nodes removed around left groin melanoma site   TEE WITHOUT CARDIOVERSION N/A 08/10/2021   Procedure: TRANSESOPHAGEAL ECHOCARDIOGRAM (TEE);  Surgeon: Lovett Sox, MD;  Location[facility];  Service: Open Heart Surgery;  Laterality: N/A;   TEMPORARY PACEMAKER N/A 08/25/2018   Procedure: TEMPORARY PACEMAKER;  Surgeon: Tonny Bollman, MD;  Location: [facility] INVASIVE CV LAB;  Service: Cardiovascular;  Laterality: N/A;   TOTAL HIP ARTHROPLASTY Left 07/14/2020   Procedure: TOTAL HIP ARTHROPLASTY;  Surgeon: Donato Heinz, MD;  Location: [facility] ORS;  Service: Orthopedics;  Laterality: Left;   Social History:  Social History   Socioeconomic History   Marital status: Married    Spouse name: [name]    Number of children: 7   Years of education: 12   Highest education level: Not on file  Occupational History    Comment: disability  Tobacco Use   Smoking status: Never   Smokeless tobacco: Never  Vaping Use   Vaping status: Never Used  Substance and Sexual Activity   Alcohol use: No   Drug use: No   Sexual activity: Not Currently  Other Topics Concern   Not on file  Social History Narrative   Lives with  Wife,   Has 2 small dogs   Caffeine use: sodas (2 per day)      Out of work on disability.  Has a walk in shower. No stairs to  climb   Oncology treatment ongoing. Uses port a cath for treatment.      pacemaker   Social Determinants of Health   Financial Resource Strain: Low Risk  (02/12/2019)   Overall Financial Resource Strain (CARDIA)    Difficulty of Paying Living Expenses: Not hard at all  Food Insecurity: Food Insecurity Present (02/21/2022)   Hunger Vital Sign    Worried About Running Out of Food in the Last Year: Sometimes true    Ran Out of Food in the Last Year: Often true  Transportation Needs: No Transportation Needs (07/25/2022)   PRAPARE - Administrator, Civil Service (Medical): No    Lack of Transportation (Non-Medical): No  Physical Activity: Unknown (02/12/2019)   Exercise Vital Sign    Days of Exercise per Week: 0 days    Minutes of Exercise per Session: Not on file  Stress: No Stress Concern Present (02/12/2019)   [employer] of Occupational Health - Occupational Stress Questionnaire    Feeling of Stress : Only a little  Social Connections: Unknown (02/12/2019)   Social Connection and Isolation Panel [NHANES]    Frequency of Communication with Friends and Family: More than three times a week    Frequency of Social Gatherings with Friends and Family: Not on file    Attends Religious Services: Not on file    Active Member of Clubs or Organizations: Not on file    Attends Banker Meetings: Not on file    Marital Status: Married  Intimate Partner Violence: Not At Risk (07/25/2022)   Humiliation, Afraid, Rape, and Kick questionnaire    Fear of Current or Ex-Partner: No    Emotionally Abused: No  Physically Abused: No    Sexually Abused: No   Family History:  Family History  Problem Relation Age of Onset   Cancer Paternal Grandmother     Review of Systems: Constitutional: Doesn't report fevers, chills or abnormal weight loss Eyes: Doesn't report blurriness of vision Ears, nose, mouth, throat, and face: Doesn't report sore throat Respiratory: Doesn't  report cough, dyspnea or wheezes Cardiovascular: Doesn't report palpitation, chest discomfort  Gastrointestinal:  Doesn't report nausea, constipation, diarrhea GU: Doesn't report incontinence Skin: Doesn't report skin rashes Neurological: Per HPI Musculoskeletal: Doesn't report joint pain Behavioral/Psych: Doesn't report anxiety  Physical Exam: Wt Readings from Last 3 Encounters:  12/15/22 253 lb 1.6 oz (114.8 kg)  11/08/22 252 lb 15.7 oz (114.8 kg)  11/06/22 254 lb (115.2 kg)   Temp Readings from Last 3 Encounters:  11/08/22 97.9 F (36.6 C) (Tympanic)  11/08/22 97.8 F (36.6 C) (Tympanic)  11/06/22 98 F (36.7 C) (Tympanic)   BP Readings from Last 3 Encounters:  11/08/22 (!) 142/63  11/08/22 131/63  11/06/22 122/70   Pulse Readings from Last 3 Encounters:  11/08/22 63  11/08/22 84  11/06/22 79   KPS: 90. General: Alert, cooperative, pleasant, in no acute distress Head: Normal EENT: No conjunctival injection or scleral icterus.  Lungs: Resp effort normal Cardiac: Regular rate Abdomen: Non-distended abdomen Skin: erythematous rash, bilateral hands Extremities: No clubbing or edema  Neurologic Exam: Mental Status: Awake, alert, attentive to examiner. Oriented to self and environment. Language is fluent with intact comprehension.  Cranial Nerves: Visual acuity is grossly normal. Visual fields are full. Extra-ocular movements intact. No ptosis. Face is symmetric Motor: Tone and bulk are normal. Power is full in both arms and legs. Reflexes are symmetric, no pathologic reflexes present.  Sensory: Intact to light touch Gait: Normal.   Labs: I have reviewed the data as listed    Component Value Date/Time   NA 136 11/08/2022 0848   K 3.9 11/08/2022 0848   CL 109 11/08/2022 0848   CO2 21 (L) 11/08/2022 0848   GLUCOSE 166 (H) 11/08/2022 0848   BUN 20 11/08/2022 0848   CREATININE 1.06 11/08/2022 0848   CALCIUM 9.0 11/08/2022 0848   PROT 6.8 11/08/2022 0848    ALBUMIN 3.9 11/08/2022 0848   AST 49 (H) 11/08/2022 0848   ALT 81 (H) 11/08/2022 0848   ALKPHOS 80 11/08/2022 0848   BILITOT 0.4 11/08/2022 0848   GFRNONAA >60 11/08/2022 0848   GFRAA >60 12/25/2019 0905   Lab Results  Component Value Date   WBC 1.8 (L) 11/08/2022   NEUTROABS 1.0 (L) 11/08/2022   HGB 10.2 (L) 11/08/2022   HCT 30.2 (L) 11/08/2022   MCV 95.6 11/08/2022   PLT 206 11/08/2022    Assessment/Plan Seizure disorder (HCC)  Bertin Cacciatore is clinically stable today.  Seizure burden remains stable and reasonably controlled with current therapy.  Recommended continuing Vimpat 100mg  BID, Clonazepam 0.5/0.5 for seizure prevention.  Ok with PRN rescue ativan 2mg  as discussed.  We appreciate the opportunity to participate in the care of Meziah Nims.  He will follow up in clinic in 6 months or sooner if needed.  All questions were answered. The patient knows to call the clinic with any problems, questions or concerns. No barriers to learning were detected.  The total time spent in the encounter was 30 minutes and more than 50% was on counseling and review of test results   Henreitta Leber, MD Medical Director of Neuro-Oncology [facility] 12/15/22 8:59 AM

## 2022-12-16 ENCOUNTER — Other Ambulatory Visit: Payer: Self-pay

## 2022-12-28 ENCOUNTER — Other Ambulatory Visit: Payer: Self-pay | Admitting: Oncology

## 2023-01-02 DIAGNOSIS — Z8509 Personal history of malignant neoplasm of other digestive organs: Secondary | ICD-10-CM | POA: Diagnosis not present

## 2023-01-02 DIAGNOSIS — C221 Intrahepatic bile duct carcinoma: Secondary | ICD-10-CM | POA: Diagnosis not present

## 2023-01-02 DIAGNOSIS — Z08 Encounter for follow-up examination after completed treatment for malignant neoplasm: Secondary | ICD-10-CM | POA: Diagnosis not present

## 2023-01-03 ENCOUNTER — Inpatient Hospital Stay: Payer: 59 | Attending: Oncology

## 2023-01-03 ENCOUNTER — Encounter: Payer: Self-pay | Admitting: Oncology

## 2023-01-03 ENCOUNTER — Inpatient Hospital Stay (HOSPITAL_BASED_OUTPATIENT_CLINIC_OR_DEPARTMENT_OTHER): Payer: 59 | Admitting: Oncology

## 2023-01-03 VITALS — BP 138/60 | HR 65 | Temp 97.7°F | Resp 18 | Wt 256.9 lb

## 2023-01-03 DIAGNOSIS — Z85820 Personal history of malignant melanoma of skin: Secondary | ICD-10-CM | POA: Diagnosis not present

## 2023-01-03 DIAGNOSIS — D631 Anemia in chronic kidney disease: Secondary | ICD-10-CM | POA: Diagnosis not present

## 2023-01-03 DIAGNOSIS — N1832 Chronic kidney disease, stage 3b: Secondary | ICD-10-CM | POA: Diagnosis not present

## 2023-01-03 DIAGNOSIS — Z95828 Presence of other vascular implants and grafts: Secondary | ICD-10-CM

## 2023-01-03 DIAGNOSIS — C438 Malignant melanoma of overlapping sites of skin: Secondary | ICD-10-CM | POA: Diagnosis not present

## 2023-01-03 DIAGNOSIS — N1831 Chronic kidney disease, stage 3a: Secondary | ICD-10-CM | POA: Insufficient documentation

## 2023-01-03 DIAGNOSIS — C221 Intrahepatic bile duct carcinoma: Secondary | ICD-10-CM

## 2023-01-03 MED ORDER — SODIUM CHLORIDE 0.9% FLUSH
10.0000 mL | INTRAVENOUS | Status: AC | PRN
  Filled 2023-01-03: qty 10

## 2023-01-03 MED ORDER — HEPARIN SOD (PORK) LOCK FLUSH 100 UNIT/ML IV SOLN
500.0000 [IU] | Freq: Once | INTRAVENOUS | Status: AC
  Filled 2023-01-03: qty 5

## 2023-01-03 NOTE — Assessment & Plan Note (Signed)
Patient has no radiographic evidence of metastatic melanoma on recent CT scan done at [facility] immunotherapy. Continue surveillance 

## 2023-01-03 NOTE — Assessment & Plan Note (Addendum)
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  Hb 11.3 Stable.

## 2023-01-03 NOTE — Assessment & Plan Note (Signed)
Encourage oral hydration and avoid nephrotoxins.   

## 2023-01-03 NOTE — Assessment & Plan Note (Addendum)
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  S/p chemotherapy Xeloda 825mg /m2 BID+ concurrent RT Previously on adjuvant Xeloda [1000 mg/m2 BID for 14 of every 21 days] -dose reduced to 825mg /m2 BID due to skin rash, still not able to tolerate- discontinue.  switched to gemcitabine weekly, 2 weeks on, 1 week off, [adjustment due to cytopenia] Labs are reviewed and discussed with patient.  S/p  6 cycles of  D1 D8 gemcitabine -dose reduced 01/02/23  surveillance CT scan  at [facility]- no recurrence. Tumor markers stable.

## 2023-01-03 NOTE — Assessment & Plan Note (Signed)
Port flush every 6 to 8 weeks.

## 2023-01-03 NOTE — Progress Notes (Signed)
Hematology/Oncology Progress note [phone] [phone]    CHIEF COMPLAINTS/REASON FOR VISIT:  Follow up for melanoma, cholangiocarcinoma.    ASSESSMENT & PLAN:   Cancer Staging  Cholangiocarcinoma [facility]) Staging form: Intrahepatic Bile Duct, AJCC 8th Edition - Pathologic stage from 01/25/2022: Stage Unknown (pT1a, pNX, cM0) - Signed by Rickard Patience, MD on 02/21/2022  Malignant melanoma of overlapping sites [facility]) Staging form: Melanoma of the Skin, AJCC 8th Edition - Pathologic: Stage Unknown (rpTX, pN1b, cM0) - Signed by Rickard Patience, MD on 07/27/2020 - Pathologic: No stage assigned - Unsigned   Cholangiocarcinoma (HCC) T1a Nx Cholangiocarcinoma Pathology was reviewed and discussed with patient.  S/p chemotherapy Xeloda 825mg /m2 BID+ concurrent RT Previously on adjuvant Xeloda [1000 mg/m2 BID for 14 of every 21 days] -dose reduced to 825mg /m2 BID due to skin rash, still not able to tolerate- discontinue.  switched to gemcitabine weekly, 2 weeks on, 1 week off, [adjustment due to cytopenia] Labs are reviewed and discussed with patient.  S/p  6 cycles of  D1 D8 gemcitabine -dose reduced 01/02/23  surveillance CT scan  at [facility]- no recurrence. Tumor markers stable.    Malignant melanoma of overlapping sites [facility]) Patient has no radiographic evidence of metastatic melanoma on recent CT scan done at [facility] immunotherapy. 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  Hb 11.3 Stable.   Stage 3a chronic kidney disease (HCC) Encourage oral hydration and avoid nephrotoxins.      Port-A-Cath in place Port flush every 6 to 8 weeks.  Follow up in 4 months.  All questions were answered. The patient knows to call the clinic with any problems, questions or concerns.  Rickard Patience, MD, PhD [facility] Hematology Oncology 01/03/2023    HISTORY OF PRESENTING ILLNESS:   Edward Pitts is a  62 y.o.  male presents for  recurrent malignant melanoma.   Oncology History  Malignant melanoma of overlapping sites [facility])  04/16/2019 Cancer Staging   Staging form: Melanoma of the Skin, AJCC 8th Edition - Pathologic: Stage Unknown (rpTX, pN1b, cM0) - Signed by Rickard Patience, MD on 07/27/2020 Stage prefix: Recurrence    04/23/2019 Initial Diagnosis   Malignant melanoma   -He has a history of left lower extremity melanoma in 2011, status post local excision -04/16/2019 patient underwent left groin mass resection  Resection pathology showed malignant melanoma, replacing a lymph node, with extracapsular extension, peripheral and deep margins involved.  Left inguinal contents, all 7 lymph nodes were negative for melanoma in the lymph nodes. Extranodal melanoma identified in lymphatic and interstitium between nodes -PDL1 80% TPS    07/07/2019 -  Radiation Therapy   status post adjuvant radiation.   07/23/2019 - 07/21/2021 Chemotherapy   Nivolumab q14d      06/29/2020 Imaging   CT chest abdomen pelvis showed stable postoperative appearance of the left groin.  No evidence of local recurrence.  No evidence of metastatic disease in the chest abdomen or pelvis.  Hepatic steatosis.  Stable subcentimeter fluid attenuation lesion of the lateral right lobe of the liver, likely benign cyst or hemangioma.  Coronary artery disease.  Aortic atherosclerosis   10/20/2020 Imaging   CT chest abdomen pelvis showed stable postoperative/radiation appearance of the left groin.  No evidence of local recurrence/metastatic disease within the chest abdomen/pelvis.  Fatty liver disease.  Diverticulosis without evidence of typhlitis.  Aortic atherosclerosis   03/10/2021 Imaging   MRI brain without contrast showed no definitive evidence of intracranial metastatic  disease.  Study is limited by absence of intravenous contrast.     04/26/2021 Imaging   CT chest abdomen pelvis without contrast showed stable post operative changes of left groin with no  evidence of recurrent disease.  No evidence of metastatic disease in the chest abdomen pelvis.  Aortic atherosclerosis   08/08/2021 - 08/16/2021 Hospital Admission    patient was hospitalized due to NSTEMI status post CABG x3.  He also had pacemaker The echocardiogram showed left ventricular ejection fraction of 65 to 70%,   09/15/2021 Imaging   CT chest abdomen pelvis w contrast  IMPRESSION: 1. Subtle hypodense 9 mm lesion in the left lobe of the liver is new from prior imaging including previous contrasted CT dating back to December 10, 2019, with the lesion appearing to equilibrate with background liver on delayed imaging sequence but is incompletely evaluated on this imaging study and technically nonspecific possibly reflecting a benign perfusional variant and while its appearance is not typical for that of a melanoma metastasis, it is not excluded on this examination. Suggest more definitive characterization by hepatic protocol MRI with and without contrast. 2. Stable postoperative changes in the left groin without evidence of local recurrent disease.3. No evidence of metastatic disease in the chest or pelvis.4.  Aortic Atherosclerosis (ICD10-I70.0).      09/23/2021 Imaging   Contrast-enhanced liver ultrasound Mildly hypoenhancing 2.2 cm mass in the posterior aspect of the left lobe of the liver with washout characteristics concerning for non hepatocellular malignancy, concerning for melanotic metastasis given history. The lesion is in an unfavorable location for percutaneous biopsy. Consider PET-CT for further characterization   10/21/2021 -  Chemotherapy   Nivolumab q14d      11/10/2021 Imaging   MRI Brain w wo contrast Negative for metastatic disease to the brain   11/10/2021 Imaging   MRI abdomen w wo contrast 1. Lesion of the posterior superior left lobe of the liver, hepatic segment II, measuring 1.9 x 1.8 cm corresponding to findings of prior imaging. Evaluation is somewhat limited  breath motion artifact however there is subtle associated rim enhancement of this lesion. Findings are most in keeping with a hepatic metastasis in the setting of known recurrent melanoma. 2. Mild hepatic steatosis. 3. Cardiomegaly.    02/27/2022 - 02/27/2022 Chemotherapy   Patient is on Treatment Plan : Capecitabine (825 mg/m2 bid) + XRT     07/12/2022 -  Chemotherapy   Patient is on Treatment Plan : PANCREAS Gemcitabine D1,8(1000) q21d     Cholangiocarcinoma (HCC)  12/19/2021 Initial Diagnosis   Liver biopsy showed carcinoma  -The slides on the patient's prior left inguinal lymph node biopsy ([id number]) with metastatic melanoma were reviewed in conjunction with  this case. The morphology of the malignant cells between the two cases are dissimilar. A limited panel of immunohistochemical stains was performed and the neoplastic cells are positive for superpancytokeratin, cytokeratin 7 (diffuse, strong), cytokeratin 20 (patchy, moderate) and negative for S100, SOX-10, and HepPar-1.These findings are consistent with carcinoma. The morphologic findings and pattern of immunohistochemical staining are non-specific and possible sites of origin include but are not limited to pancreaticobiliary tract, GI tract, prostate, kidney, lung, and breast. Per [facility], the patient had a PET scan performed in July 2023 which demonstrated a single hepatic  hypermetabolic lesion without other sites of disease.    01/19/2022 Surgery   Liver lesion resection at [facility] by Dr.Zani  A.  Liver, segment 2, 3, 4A, partial hepatectomy:   Cholangiocarcinoma, moderately differentiated (2.5 cm,  segment 2).  Tumor extends to hepatic parenchymal margin, but all other margins are negative for tumor. See synoptic report and comment.   Background liver with: Mild steatosis (20%).  No definitive evidence of steatohepatitis.   Mild periportal fibrosis (trichrome and reticulin). No stainable iron (Prussian blue stain).  No evidence to  support alpha-1-antitrypsin deficiency on PAS-D stain.   pT1a pNx   01/25/2022 Cancer Staging   Staging form: Intrahepatic Bile Duct, AJCC 8th Edition - Pathologic stage from 01/25/2022: Stage Unknown (pT1a, pNX, cM0) - Signed by Rickard Patience, MD on 02/21/2022 Stage prefix: Initial diagnosis   03/23/2022 - 05/01/2022 Radiation Therapy   Concurrent Xeloda 825mg /m2 BID + Radiation.    05/08/2022 -  Chemotherapy   Xeloda [1000 mg/m2 BID for 14 of every 21 days], plan 4 months.   05/08/2022, cycle 1 Xeloda.  Xeloda was stopped on 05/19/22 due to developing rash on face as well as dorsum of hands. Treated with steroid,symptoms resolved.  3/4-3/13/2024  Xeloda was stopped early due to groin cellulitis, he also developed similar skin rash again  Plan 3/25 cycle 3 dose reduce Xeloda 825 mg/m2   2000mg  BID x 14 days.   05/09/2022 Imaging   CT chest abdomen pelvis with contrast at [facility] showed Status post partial left hepatectomy without CT evidence of recurrent or metastatic disease in the chest, abdomen, pelvis.     07/24/2022 - 07/26/2022 Hospital Admission   Patient was hospitalized due to symptomatic anemia, status post 1 unit of PRBC transfusion. Also neutropenia secondary to chemotherapy.  Nadir was 0.9.  Patient was afebrile.   09/05/2022 Imaging   CT chest abdomen pelvis with contrast at [facility]  Postsurgical changes of left hepatectomy without evidence of recurrent or  metastatic disease.    01/02/2023 Imaging   CT chest abdomen pelvis w [facility] No evidence of recurrent or metastatic disease.     04/18/2021, patient establish care with neurology Dr. Barbaraann Cao for intermittent altered cognition.  he was recommended to start Vimpat.    INTERVAL HISTORY Edward Pitts is a 62 y.o. male who has above history reviewed by me today presents for follow up visit for management of inguinal nodal recurrence of melanoma, resected T1a cholangiocarcinoma. He reports feeling well, denies any weakness or  lightheaded. No new complaints. Patient has had CT scanning and a blood work done yesterday at [facility].  :Review of Systems  Constitutional:  Negative for appetite change, chills, fatigue, fever and unexpected weight change.  HENT:   Negative for hearing loss and voice change.   Eyes:  Negative for eye problems and icterus.  Respiratory:  Negative for chest tightness, cough and shortness of breath.   Cardiovascular:  Positive for leg swelling. Negative for chest pain.  Gastrointestinal:  Negative for abdominal distention and abdominal pain.  Endocrine: Negative for hot flashes.  Genitourinary:  Negative for difficulty urinating, dysuria and frequency.   Musculoskeletal:        Status post left hip replacement  Skin:  Negative for itching.       Skin hypo-pigmentation on upper extremities, no change   Neurological:  Negative for light-headedness and numbness.  Hematological:  Negative for adenopathy. Does not bruise/bleed easily.  Psychiatric/Behavioral:  Negative for confusion.        Grieving.     MEDICAL HISTORY:  Past Medical History:  Diagnosis Date   AKI (acute kidney injury) (HCC) 08/24/2018   Anemia    iron treatments   Anxiety    Aortic  atherosclerosis (HCC)    Arthritis    Cancer of groin (HCC) 2021   left groin, resected, radiation   Cataract    Complication of anesthesia    PONV   Coronary artery disease    Dizziness of unknown etiology    has led to seizures and passing out.   Family history of adverse reaction to anesthesia    PONV mother   GERD (gastroesophageal reflux disease)    H/O Malignant melanoma 09/18/2022   History of complete heart block    PPM placed   Hyperlipidemia    Hypertension    LBBB (left bundle branch block)    Lymphedema of left leg    uses thigh high compression stockings   Melanoma (HCC) 2012   skin cancer, left thigh   OSA on CPAP    PONV (postoperative nausea and vomiting) 04/16/2019   Port-A-Cath in place    RIGHT chest  wall   Presence of cardiac pacemaker    [employer]   Problem related to unspecified psychosocial circumstances 09/18/2022   Seizures (HCC)    still has episodes of dizziness. last event 1 month ago (march 2022) and will pass out. takes clonazepam    SURGICAL HISTORY: Past Surgical History:  Procedure Laterality Date   CORONARY ARTERY BYPASS GRAFT N/A 08/10/2021   Procedure: CORONARY ARTERY BYPASS GRAFTING (CABG) X 3 USING LEFT INTERNAL MAMMARY ARTERY AND RIGHT GREATER SAPHENOUS VEIN;  Surgeon: Lovett Sox, MD;  Location: [facility] OR;  Service: Open Heart Surgery;  Laterality: N/A;   CT RADIATION THERAPY GUIDE     left groin   dental implant     permanent implant   ENDOVEIN HARVEST OF GREATER SAPHENOUS VEIN Right 08/10/2021   Procedure: ENDOVEIN HARVEST OF GREATER SAPHENOUS VEIN;  Surgeon: Lovett Sox, MD;  Location: [facility] OR;  Service: Open Heart Surgery;  Laterality: Right;   KNEE SURGERY Left    arthroscopy   LEFT HEART CATH AND CORONARY ANGIOGRAPHY Left 06/29/2017   Procedure: LEFT HEART CATH AND CORONARY ANGIOGRAPHY;  Surgeon: Lamar Blinks, MD;  Location: [facility] INVASIVE CV LAB;  Service: Cardiovascular;  Laterality: Left;   LEFT HEART CATH AND CORONARY ANGIOGRAPHY N/A 08/08/2021   Procedure: LEFT HEART CATH AND CORONARY ANGIOGRAPHY;  Surgeon: Lamar Blinks, MD;  Location: [facility] INVASIVE CV LAB;  Service: Cardiovascular;  Laterality: N/A;   LYMPH NODE DISSECTION Left 04/16/2019   Procedure: Left inguinal Lymph Node Dissection;  Surgeon: Almond Lint, MD;  Location: [facility] OR;  Service: General;  Laterality: Left;   MELANOMA EXCISION Left 04/16/2019   Procedure: MELANOMA EXCISION LEFT GROIN MASS;  Surgeon: Almond Lint, MD;  Location: [facility] OR;  Service: General;  Laterality: Left;   MELANOMA EXCISION WITH SENTINEL LYMPH NODE BIOPSY Left 2012   Left calf    PACEMAKER INSERTION N/A 08/26/2018   Procedure: INSERTION PACEMAKER;  Surgeon: Marcina Millard, MD;  Location: [facility] ORS;  Service:  Cardiovascular;  Laterality: N/A;   PORTA CATH INSERTION N/A 08/26/2019   Procedure: PORTA CATH INSERTION;  Surgeon: Renford Dills, MD;  Location: [facility] INVASIVE CV LAB;  Service: Cardiovascular;  Laterality: N/A;   SUPERFICIAL LYMPH NODE BIOPSY / EXCISION Left 2020   lymph nodes removed around left groin melanoma site   TEE WITHOUT CARDIOVERSION N/A 08/10/2021   Procedure: TRANSESOPHAGEAL ECHOCARDIOGRAM (TEE);  Surgeon: Lovett Sox, MD;  Location[facility] OR;  Service: Open Heart Surgery;  Laterality: N/A;   TEMPORARY PACEMAKER N/A 08/25/2018   Procedure: TEMPORARY PACEMAKER;  Surgeon: Excell Seltzer,  Casimiro Needle, MD;  Location: [facility] INVASIVE CV LAB;  Service: Cardiovascular;  Laterality: N/A;   TOTAL HIP ARTHROPLASTY Left 07/14/2020   Procedure: TOTAL HIP ARTHROPLASTY;  Surgeon: Donato Heinz, MD;  Location: [facility] ORS;  Service: Orthopedics;  Laterality: Left;    SOCIAL HISTORY: Social History   Socioeconomic History   Marital status: Married    Spouse name: [name]    Number of children: 7   Years of education: 12   Highest education level: Not on file  Occupational History    Comment: disability  Tobacco Use   Smoking status: Never   Smokeless tobacco: Never  Vaping Use   Vaping status: Never Used  Substance and Sexual Activity   Alcohol use: No   Drug use: No   Sexual activity: Not Currently  Other Topics Concern   Not on file  Social History Narrative   Lives with  Wife,   Has 2 small dogs   Caffeine use: sodas (2 per day)      Out of work on disability.  Has a walk in shower. No stairs to climb   Oncology treatment ongoing. Uses port a cath for treatment.      pacemaker   Social Determinants of Health   Financial Resource Strain: Low Risk  (02/12/2019)   Overall Financial Resource Strain (CARDIA)    Difficulty of Paying Living Expenses: Not hard at all  Food Insecurity: Food Insecurity Present (02/21/2022)   Hunger Vital Sign    Worried About Running Out of Food in the Last  Year: Sometimes true    Ran Out of Food in the Last Year: Often true  Transportation Needs: No Transportation Needs (07/25/2022)   PRAPARE - Administrator, Civil Service (Medical): No    Lack of Transportation (Non-Medical): No  Physical Activity: Unknown (02/12/2019)   Exercise Vital Sign    Days of Exercise per Week: 0 days    Minutes of Exercise per Session: Not on file  Stress: No Stress Concern Present (02/12/2019)   [employer] of Occupational Health - Occupational Stress Questionnaire    Feeling of Stress : Only a little  Social Connections: Unknown (02/12/2019)   Social Connection and Isolation Panel [NHANES]    Frequency of Communication with Friends and Family: More than three times a week    Frequency of Social Gatherings with Friends and Family: Not on file    Attends Religious Services: Not on file    Active Member of Clubs or Organizations: Not on file    Attends Banker Meetings: Not on file    Marital Status: Married  Intimate Partner Violence: Not At Risk (07/25/2022)   Humiliation, Afraid, Rape, and Kick questionnaire    Fear of Current or Ex-Partner: No    Emotionally Abused: No    Physically Abused: No    Sexually Abused: No    FAMILY HISTORY: Family History  Problem Relation Age of Onset   Cancer Paternal Grandmother     ALLERGIES:  is allergic to ibuprofen, levetiracetam, nsaids, and capecitabine.  MEDICATIONS:  Current Outpatient Medications  Medication Sig Dispense Refill   ALPRAZolam (XANAX) 0.5 MG tablet Take 1 tablet (0.5 mg total) by mouth 2 (two) times daily as needed for anxiety. 30 tablet 0   apixaban (ELIQUIS) 5 MG TABS tablet Take 5 mg by mouth 2 (two) times daily.     atorvastatin (LIPITOR) 40 MG tablet Take 0.5 tablets by mouth at bedtime.     Carboxymeth-Glyc-Polysorb  PF (REFRESH OPTIVE MEGA-3) 0.5-1-0.5 % SOLN Place 1 drop into both eyes daily as needed (dry eyes).     clonazePAM (KLONOPIN) 0.5 MG tablet  TAKE 1 TABLET BY MOUTH IN THE MORNING AND 1 BY MOUTH IN THE EVENING per pt 120 tablet 1   clotrimazole (CLOTRIMAZOLE ANTI-FUNGAL) 1 % cream Apply 1 Application topically 2 (two) times daily. Apply to the L inguinal fold 30 g 0   febuxostat (ULORIC) 40 MG tablet Take 40 mg by mouth daily.     hydrocortisone 2.5 % ointment Apply 1 application. topically daily as needed (itching).     Lacosamide 100 MG TABS Take 1 tablet (100 mg total) by mouth in the morning and at bedtime. 60 tablet 3   lidocaine-prilocaine (EMLA) cream Apply 1 Application topically as needed. Apply small amount of cream to port site approx 1-2 hours prior to appointment. 30 g 11   lisinopril (ZESTRIL) 10 MG tablet Take 10 mg by mouth daily.     metoprolol succinate (TOPROL-XL) 25 MG 24 hr tablet Take 25 mg by mouth daily.     Multiple Vitamin (MULTIVITAMIN WITH MINERALS) TABS tablet Take 1 tablet by mouth daily. Centrum Silver     nystatin (MYCOSTATIN/NYSTOP) powder Apply 1 Application topically 3 (three) times daily. 60 g 1   omeprazole (PRILOSEC) 20 MG capsule TAKE 1 CAPSULE BY MOUTH EVERY DAY 90 capsule 0   ondansetron (ZOFRAN) 4 MG tablet TAKE 1 TABLET BY MOUTH EVERY 8 HOURS AS NEEDED FOR NAUSEA AND VOMITING 90 tablet 1   traZODone (DESYREL) 50 MG tablet Take 50 mg by mouth at bedtime as needed.     No current facility-administered medications for this visit.   Facility-Administered Medications Ordered in Other Visits  Medication Dose Route Frequency Provider Last Rate Last Admin   heparin lock flush 100 UNIT/ML injection            heparin lock flush 100 UNIT/ML injection            heparin lock flush 100 unit/mL  500 Units Intravenous Once Rickard Patience, MD       sodium chloride flush (NS) 0.9 % injection 10 mL  10 mL Intravenous PRN Rickard Patience, MD         PHYSICAL EXAMINATION: ECOG PERFORMANCE STATUS: 1 - Symptomatic but completely ambulatory Vitals:   01/03/23 1020  BP: 138/60  Pulse: 65  Resp: 18  Temp: 97.7 F  (36.5 C)   Filed Weights   01/03/23 1020  Weight: 256 lb 14.4 oz (116.5 kg)    Physical Exam Constitutional:      General: He is not in acute distress.    Appearance: He is obese.     Comments: Patient ambulates independently  HENT:     Head: Normocephalic and atraumatic.  Eyes:     General: No scleral icterus.    Pupils: Pupils are equal, round, and reactive to light.  Cardiovascular:     Rate and Rhythm: Normal rate and regular rhythm.  Pulmonary:     Effort: Pulmonary effort is normal. No respiratory distress.     Breath sounds: No wheezing.  Abdominal:     General: Bowel sounds are normal. There is no distension.     Palpations: Abdomen is soft. There is no mass.     Tenderness: There is no abdominal tenderness.     Comments:    Musculoskeletal:        General: No deformity. Normal range of motion.  Cervical back: Normal range of motion and neck supple.     Comments: Left lower extremity edema-chronic   Skin:    General: Skin is warm and dry.  Neurological:     Mental Status: He is alert and oriented to person, place, and time. Mental status is at baseline.     Cranial Nerves: No cranial nerve deficit.     Coordination: Coordination normal.  Psychiatric:        Mood and Affect: Mood normal.     LABORATORY DATA:  I have reviewed the data as listed    Latest Ref Rng & Units 11/08/2022    8:48 AM 11/01/2022    8:47 AM 10/18/2022    9:02 AM  CBC  WBC 4.0 - 10.5 K/uL 1.8  4.3  4.6   Hemoglobin 13.0 - 17.0 g/dL 57.8  46.9  62.9   Hematocrit 39.0 - 52.0 % 30.2  33.6  35.3   Platelets 150 - 400 K/uL 206  204  104       Latest Ref Rng & Units 11/08/2022    8:48 AM 11/01/2022    8:47 AM 10/18/2022    9:02 AM  CMP  Glucose 70 - 99 mg/dL 528  413  244   BUN 8 - 23 mg/dL 20  27  19    Creatinine 0.61 - 1.24 mg/dL 0.10  2.72  5.36   Sodium 135 - 145 mmol/L 136  136  136   Potassium 3.5 - 5.1 mmol/L 3.9  4.0  4.0   Chloride 98 - 111 mmol/L 109  108  106   CO2 22 -  32 mmol/L 21  22  25    Calcium 8.9 - 10.3 mg/dL 9.0  8.9  9.0   Total Protein 6.5 - 8.1 g/dL 6.8  6.9  7.0   Total Bilirubin 0.3 - 1.2 mg/dL 0.4  0.6  0.5   Alkaline Phos 38 - 126 U/L 80  81  83   AST 15 - 41 U/L 49  36  46   ALT 0 - 44 U/L 81  57  71      RADIOGRAPHIC STUDIES: I have personally reviewed the radiological images as listed and agreed with the findings in the report. No results found.

## 2023-01-03 NOTE — Progress Notes (Signed)
Pt here for follow up. Pt reports that insurance and [facility] currently have an agreement for her to get scans there, but if that agreement stops he will need to start getting scans done here at [facility]. He will be going back to [facility] in Feb.

## 2023-01-17 DIAGNOSIS — I442 Atrioventricular block, complete: Secondary | ICD-10-CM | POA: Diagnosis not present

## 2023-01-17 DIAGNOSIS — Z950 Presence of cardiac pacemaker: Secondary | ICD-10-CM | POA: Diagnosis not present

## 2023-02-06 ENCOUNTER — Other Ambulatory Visit: Payer: Self-pay | Admitting: Internal Medicine

## 2023-02-19 DIAGNOSIS — G4733 Obstructive sleep apnea (adult) (pediatric): Secondary | ICD-10-CM | POA: Diagnosis not present

## 2023-02-23 ENCOUNTER — Other Ambulatory Visit: Payer: Self-pay | Admitting: Oncology

## 2023-02-26 ENCOUNTER — Encounter: Payer: Self-pay | Admitting: Oncology

## 2023-02-28 ENCOUNTER — Inpatient Hospital Stay: Payer: 59 | Attending: Oncology

## 2023-02-28 DIAGNOSIS — Z452 Encounter for adjustment and management of vascular access device: Secondary | ICD-10-CM | POA: Diagnosis not present

## 2023-02-28 DIAGNOSIS — C438 Malignant melanoma of overlapping sites of skin: Secondary | ICD-10-CM

## 2023-02-28 DIAGNOSIS — Z95828 Presence of other vascular implants and grafts: Secondary | ICD-10-CM

## 2023-02-28 DIAGNOSIS — C221 Intrahepatic bile duct carcinoma: Secondary | ICD-10-CM | POA: Diagnosis not present

## 2023-02-28 NOTE — Patient Instructions (Signed)

## 2023-03-08 DIAGNOSIS — E782 Mixed hyperlipidemia: Secondary | ICD-10-CM | POA: Diagnosis not present

## 2023-03-08 DIAGNOSIS — I442 Atrioventricular block, complete: Secondary | ICD-10-CM | POA: Diagnosis not present

## 2023-03-08 DIAGNOSIS — I483 Typical atrial flutter: Secondary | ICD-10-CM | POA: Diagnosis not present

## 2023-03-08 DIAGNOSIS — I447 Left bundle-branch block, unspecified: Secondary | ICD-10-CM | POA: Diagnosis not present

## 2023-03-08 DIAGNOSIS — R001 Bradycardia, unspecified: Secondary | ICD-10-CM | POA: Diagnosis not present

## 2023-03-08 DIAGNOSIS — Z950 Presence of cardiac pacemaker: Secondary | ICD-10-CM | POA: Diagnosis not present

## 2023-03-08 DIAGNOSIS — I214 Non-ST elevation (NSTEMI) myocardial infarction: Secondary | ICD-10-CM | POA: Diagnosis not present

## 2023-03-08 DIAGNOSIS — Z951 Presence of aortocoronary bypass graft: Secondary | ICD-10-CM | POA: Diagnosis not present

## 2023-03-15 ENCOUNTER — Encounter: Payer: Self-pay | Admitting: Family

## 2023-03-15 ENCOUNTER — Ambulatory Visit: Payer: 59 | Admitting: Family

## 2023-03-15 VITALS — BP 141/84 | HR 70 | Ht 67.0 in | Wt 250.0 lb

## 2023-03-15 DIAGNOSIS — G4733 Obstructive sleep apnea (adult) (pediatric): Secondary | ICD-10-CM

## 2023-03-15 DIAGNOSIS — E538 Deficiency of other specified B group vitamins: Secondary | ICD-10-CM

## 2023-03-15 DIAGNOSIS — E559 Vitamin D deficiency, unspecified: Secondary | ICD-10-CM

## 2023-03-15 DIAGNOSIS — Z95811 Presence of heart assist device: Secondary | ICD-10-CM

## 2023-03-15 DIAGNOSIS — R7303 Prediabetes: Secondary | ICD-10-CM

## 2023-03-15 DIAGNOSIS — I10 Essential (primary) hypertension: Secondary | ICD-10-CM

## 2023-03-15 DIAGNOSIS — N1831 Chronic kidney disease, stage 3a: Secondary | ICD-10-CM | POA: Diagnosis not present

## 2023-03-15 DIAGNOSIS — I25118 Atherosclerotic heart disease of native coronary artery with other forms of angina pectoris: Secondary | ICD-10-CM | POA: Diagnosis not present

## 2023-03-15 DIAGNOSIS — E66813 Obesity, class 3: Secondary | ICD-10-CM

## 2023-03-15 DIAGNOSIS — E782 Mixed hyperlipidemia: Secondary | ICD-10-CM | POA: Diagnosis not present

## 2023-03-15 NOTE — Assessment & Plan Note (Signed)
Continue current meds.  Will adjust as needed based on results.  The patient is asked to make an attempt to improve diet and exercise patterns to aid in medical management of this problem. Addressed importance of increasing and maintaining water intake.   

## 2023-03-15 NOTE — Assessment & Plan Note (Signed)
A1C Continues to be in prediabetic ranges.  Will reassess at follow up after next lab check.  Patient counseled on dietary choices and verbalized understanding.  Patient educated on foods that contain carbohydrates and the need to decrease intake.  We discussed prediabetes, and what it means and the need for strict dietary control to prevent progression to type 2 diabetes.  Advised to decrease intake of sugary drinks, including sodas, sweet tea, and some juices, and of starch and sugar heavy foods (ie., potatoes, rice, bread, pasta, desserts). He verbalizes understanding and agreement with the changes discussed today.  

## 2023-03-15 NOTE — Assessment & Plan Note (Signed)
Patient is seen by Cardiology/EP, who manage this condition.  He is well controlled with current therapy.   Will defer to them for further changes to plan of care.

## 2023-03-15 NOTE — Assessment & Plan Note (Signed)
Patient is seen by Cardiology, who manage this condition.  He is well controlled with current therapy.   Will defer to them for further changes to plan of care.  

## 2023-03-15 NOTE — Assessment & Plan Note (Signed)
Blood pressure well controlled with current medications.  Continue current therapy.  Will reassess at follow up.  

## 2023-03-15 NOTE — Assessment & Plan Note (Signed)
Patient is seen by Nephrology, who manage this condition.  He is well controlled with current therapy.   Will defer to them for further changes to plan of care.

## 2023-03-15 NOTE — Progress Notes (Addendum)
Established Patient Office Visit  Subjective:  Patient ID: Edward Pitts, male    DOB: 1960/06/20  Age: 62 y.o. MRN: [id number]  Chief Complaint  Patient presents with   Follow-up    Patient is here today for his 6 months follow up.  He has been feeling fairly well since last appointment.   He does not have additional concerns to discuss today.  Labs are due today. He needs refills.   I have reviewed his active problem list, medication list, allergies, health maintenance, notes from last encounter, lab results for his appointment today.      No other concerns at this time.   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   Elevated serum creatinine 04/15/2021   Exposure to combat 04/20/2020   Exposure to potentially hazardous substance 09/18/2022   Sep 11, 2022 Entered By: Derinda Late Comment: Entered automatically through [facility] Problem List documentation program     Family history of adverse reaction to anesthesia    PONV mother   GERD (gastroesophageal reflux disease)    H/O Malignant melanoma 09/18/2022   History of complete heart block    PPM placed   Hyperlipidemia    Hypertension    LBBB (left bundle branch block)    Lymphedema of left leg    uses thigh high compression stockings   Melanoma (HCC) 2012   skin cancer, left thigh   OSA on CPAP    PONV (postoperative nausea and vomiting) 04/16/2019   Port-A-Cath in place    RIGHT chest wall   Presence of cardiac pacemaker    [employer]   Problem related to unspecified psychosocial circumstances 09/18/2022   Seizures (HCC)    still has episodes of  dizziness. last event 1 month ago (march 2022) and will pass out. takes clonazepam    Past Surgical History:  Procedure Laterality Date   CORONARY ARTERY BYPASS GRAFT N/A 08/10/2021   Procedure: CORONARY ARTERY BYPASS GRAFTING (CABG) X 3 USING LEFT INTERNAL MAMMARY ARTERY AND RIGHT GREATER SAPHENOUS VEIN;  Surgeon: Lovett Sox, MD;  Location: [facility] OR;  Service: Open Heart Surgery;  Laterality: N/A;   CT RADIATION THERAPY GUIDE     left groin   dental implant     permanent implant   ENDOVEIN HARVEST OF GREATER SAPHENOUS VEIN Right 08/10/2021   Procedure: ENDOVEIN HARVEST OF GREATER SAPHENOUS VEIN;  Surgeon: Lovett Sox, MD;  Location: [facility] OR;  Service: Open Heart Surgery;  Laterality: Right;   KNEE SURGERY Left    arthroscopy   LEFT HEART CATH AND CORONARY ANGIOGRAPHY Left 06/29/2017   Procedure: LEFT HEART CATH AND CORONARY ANGIOGRAPHY;  Surgeon: Lamar Blinks, MD;  Location: [facility] INVASIVE CV LAB;  Service: Cardiovascular;  Laterality: Left;   LEFT HEART CATH AND CORONARY ANGIOGRAPHY N/A 08/08/2021   Procedure: LEFT HEART CATH AND CORONARY ANGIOGRAPHY;  Surgeon: Lamar Blinks, MD;  Location: [facility] INV[facility] CV LAB;  Service: Cardiovascular;  Laterality: N/A;   LYMPH NODE DISSECTION Left 04/16/2019   Procedure: Left inguinal Lymph Node  Dissection;  Surgeon: Almond Lint, MD;  Location: [facility] OR;  Service: General;  Laterality: Left;   MELANOMA EXCISION Left 04/16/2019   Procedure: MELANOMA EXCISION LEFT GROIN MASS;  Surgeon: Almond Lint, MD;  Location: [facility] OR;  Service: General;  Laterality: Left;   MELANOMA EXCISION WITH SENTINEL LYMPH NODE BIOPSY Left 2012   Left calf    PACEMAKER INSERTION N/A 08/26/2018   Procedure: INSERTION PACEMAKER;  Surgeon: Marcina Millard, MD;  Location: [facility] ORS;  Service: Cardiovascular;  Laterality: N/A;   PORTA CATH INSERTION N/A 08/26/2019   Procedure: PORTA CATH INSERTION;  Surgeon: Renford Dills, MD;  Location: [facility] INV[facility] CV LAB;  Service:  Cardiovascular;  Laterality: N/A;   SUPERFICIAL LYMPH NODE BIOPSY / EXCISION Left 2020   lymph nodes removed around left groin melanoma site   TEE WITHOUT CARDIOVERSION N/A 08/10/2021   Procedure: TRANSESOPHAGEAL ECHOCARDIOGRAM (TEE);  Surgeon: Lovett Sox, MD;  Location: [facility] OR;  Service: Open Heart Surgery;  Laterality: N/A;   TEMPORARY PACEMAKER N/A 08/25/2018   Procedure: TEMPORARY PACEMAKER;  Surgeon: Tonny Bollman, MD;  Location: [facility] INVASIVE CV LAB;  Service: Cardiovascular;  Laterality: N/A;   TOTAL HIP ARTHROPLASTY Left 07/14/2020   Procedure: TOTAL HIP ARTHROPLASTY;  Surgeon: Donato Heinz, MD;  Location: [facility] ORS;  Service: Orthopedics;  Laterality: Left;    Social History   Socioeconomic History   Marital status: Married    Spouse name: [name]    Number of children: 7   Years of education: 12   Highest education level: Not on file  Occupational History    Comment: disability  Tobacco Use   Smoking status: Never   Smokeless tobacco: Never  Vaping Use   Vaping status: Never Used  Substance and Sexual Activity   Alcohol use: No   Drug use: No   Sexual activity: Not Currently  Other Topics Concern   Not on file  Social History Narrative   Lives with  Wife,   Has 2 small dogs   Caffeine use: sodas (2 per day)      Out of work on disability.  Has a walk in shower. No stairs to climb   Oncology treatment ongoing. Uses port a cath for treatment.      pacemaker   Social Drivers of Health   Financial Resource Strain: Low Risk  (02/12/2019)   Overall Financial Resource Strain (CARDIA)    Difficulty of Paying Living Expenses: Not hard at all  Food Insecurity: No Food Insecurity (03/15/2023)   Hunger Vital Sign    Worried About Running Out of Food in the Last Year: Never true    Ran Out of Food in the Last Year: Never true  Transportation Needs: No Transportation Needs (03/15/2023)   PRAPARE - Administrator, Civil Service (Medical): No    Lack of  Transportation (Non-Medical): No  Physical Activity: Inactive (03/15/2023)   Exercise Vital Sign    Days of Exercise per Week: 0 days    Minutes of Exercise per Session: 0 min  Stress: No Stress Concern Present (03/15/2023)   [employer] of Occupational Health - Occupational Stress Questionnaire    Feeling of Stress : Not at all  Social Connections: Unknown (02/12/2019)   Social Connection and Isolation Panel [NHANES]    Frequency of Communication with Friends and Family: More than three times a week    Frequency of Social Gatherings with Friends and Family: Not on file    Attends Religious Services: Not on  file    Active Member of Clubs or Organizations: Not on file    Attends Club or Organization Meetings: Not on file    Marital Status: Married  Intimate Partner Violence: Not At Risk (03/15/2023)   Humiliation, Afraid, Rape, and Kick questionnaire    Fear of Current or Ex-Partner: No    Emotionally Abused: No    Physically Abused: No    Sexually Abused: No    Family History  Problem Relation Age of Onset   Cancer Paternal Grandmother     Allergies  Allergen Reactions   Ibuprofen Other (See Comments)    Affects kidneys   Levetiracetam Rash and Hives    Other reaction(s): Dizziness, Headache  Other Reaction(s): Dizziness, Headache, Other (see comments), Unknown  Dizziness & headaches   Nsaids     Affects kidneys   Capecitabine Rash    Other Reaction(s): Eruption of skin    Review of Systems  All other systems reviewed and are negative.    Objective:   BP (!) 141/84   Pulse 70   Ht 5\' 7"  (1.702 m)   Wt 250 lb (113.4 kg)   SpO2 96%   BMI 39.16 kg/m   Vitals:   03/15/23 0904  BP: (!) 141/84  Pulse: 70  Height: 5\' 7"  (1.702 m)  Weight: 250 lb (113.4 kg)  SpO2: 96%  BMI (Calculated): 39.15    Physical Exam Vitals and nursing note reviewed.  Constitutional:      Appearance: Normal appearance. He is obese.  Eyes:     Pupils: Pupils are equal,  round, and reactive to light.  Cardiovascular:     Rate and Rhythm: Normal rate and regular rhythm.     Pulses: Normal pulses.     Heart sounds: Normal heart sounds.  Pulmonary:     Effort: Pulmonary effort is normal.     Breath sounds: Normal breath sounds.  Neurological:     Mental Status: He is alert.  Psychiatric:        Mood and Affect: Mood normal.        Behavior: Behavior normal.      Results for orders placed or performed in visit on 03/15/23  Lipid panel  Result Value Ref Range   Cholesterol, Total 152 100 - 199 mg/dL   Triglycerides 161 0 - 149 mg/dL   HDL 39 (L) >09 mg/dL   VLDL Cholesterol Cal 22 5 - 40 mg/dL   LDL Chol Calc (NIH) 91 0 - 99 mg/dL   Chol/HDL Ratio 3.9 0.0 - 5.0 ratio  VITAMIN D 25 Hydroxy (Vit-D Deficiency, Fractures)  Result Value Ref Range   Vit D, 25-Hydroxy 45.7 30.0 - 100.0 ng/mL  CMP14+EGFR  Result Value Ref Range   Glucose 102 (H) 70 - 99 mg/dL   BUN 21 8 - 27 mg/dL   Creatinine, Ser 6.04 0.76 - 1.27 mg/dL   eGFR 67 >54 UJ/WJX/9.14   BUN/Creatinine Ratio 17 10 - 24   Sodium 141 134 - 144 mmol/L   Potassium 5.0 3.5 - 5.2 mmol/L   Chloride 106 96 - 106 mmol/L   CO2 21 20 - 29 mmol/L   Calcium 10.1 8.6 - 10.2 mg/dL   Total Protein 7.3 6.0 - 8.5 g/dL   Albumin 4.3 3.9 - 4.9 g/dL   Globulin, Total 3.0 1.5 - 4.5 g/dL   Bilirubin Total 0.4 0.0 - 1.2 mg/dL   Alkaline Phosphatase 98 44 - 121 IU/L   AST 42 (H) 0 -  40 IU/L   ALT 63 (H) 0 - 44 IU/L  Hemoglobin A1c  Result Value Ref Range   Hgb A1c MFr Bld 6.0 (H) 4.8 - 5.6 %   Est. average glucose Bld gHb Est-mCnc 126 mg/dL  Vitamin Z61  Result Value Ref Range   Vitamin B-12 852 232 - 1,245 pg/mL  CBC with Diff  Result Value Ref Range   WBC 7.8 3.4 - 10.8 x10E3/uL   RBC 4.18 4.14 - 5.80 x10E6/uL   Hemoglobin 12.6 (L) 13.0 - 17.7 g/dL   Hematocrit 09.6 04.5 - 51.0 %   MCV 93 79 - 97 fL   MCH 30.1 26.6 - 33.0 pg   MCHC 32.4 31.5 - 35.7 g/dL   RDW 40.9 81.1 - 91.4 %   Platelets 194  150 - 450 x10E3/uL   Neutrophils 80 Not Estab. %   Lymphs 10 Not Estab. %   Monocytes 8 Not Estab. %   Eos 1 Not Estab. %   Basos 0 Not Estab. %   Neutrophils Absolute 6.2 1.4 - 7.0 x10E3/uL   Lymphocytes Absolute 0.8 0.7 - 3.1 x10E3/uL   Monocytes Absolute 0.6 0.1 - 0.9 x10E3/uL   EOS (ABSOLUTE) 0.1 0.0 - 0.4 x10E3/uL   Basophils Absolute 0.0 0.0 - 0.2 x10E3/uL   Immature Granulocytes 1 Not Estab. %   Immature Grans (Abs) 0.1 0.0 - 0.1 x10E3/uL    Recent Results (from the past 2160 hours)  Lipid panel     Status: Abnormal   Collection Time: 03/15/23 10:55 AM  Result Value Ref Range   Cholesterol, Total 152 100 - 199 mg/dL   Triglycerides 782 0 - 149 mg/dL   HDL 39 (L) >95 mg/dL   VLDL Cholesterol Cal 22 5 - 40 mg/dL   LDL Chol Calc (NIH) 91 0 - 99 mg/dL   Chol/HDL Ratio 3.9 0.0 - 5.0 ratio    Comment:                                   T. Chol/HDL Ratio                                             Men  Women                               1/2 Avg.Risk  3.4    3.3                                   Avg.Risk  5.0    4.4                                2X Avg.Risk  9.6    7.1                                3X Avg.Risk 23.4   11.0   VITAMIN D 25 Hydroxy (Vit-D Deficiency, Fractures)     Status: None   Collection Time: 03/15/23 10:55 AM  Result Value Ref Range   Vit D, 25-Hydroxy 45.7  30.0 - 100.0 ng/mL    Comment: Vitamin D deficiency has been defined by the Institute of Medicine and an Endocrine Society practice guideline as a level of serum 25-OH vitamin D less than 20 ng/mL (1,2). The Endocrine Society went on to further define vitamin D insufficiency as a level between 21 and 29 ng/mL (2). 1. IOM (Institute of Medicine). 2010. Dietary reference    intakes for calcium and D. [place]: The    [employer]. 2. Holick MF, Binkley [place], Bischoff-Ferrari HA, et al.    Evaluation, treatment, and prevention of vitamin D    deficiency: an Endocrine Society clinical  practice    guideline. JCEM. 2011 Jul; 96(7):1911-30.   CMP14+EGFR     Status: Abnormal   Collection Time: 03/15/23 10:55 AM  Result Value Ref Range   Glucose 102 (H) 70 - 99 mg/dL   BUN 21 8 - 27 mg/dL   Creatinine, Ser 8.84 0.76 - 1.27 mg/dL   eGFR 67 >16 SA/YTK/1.60   BUN/Creatinine Ratio 17 10 - 24   Sodium 141 134 - 144 mmol/L   Potassium 5.0 3.5 - 5.2 mmol/L   Chloride 106 96 - 106 mmol/L   CO2 21 20 - 29 mmol/L   Calcium 10.1 8.6 - 10.2 mg/dL   Total Protein 7.3 6.0 - 8.5 g/dL   Albumin 4.3 3.9 - 4.9 g/dL   Globulin, Total 3.0 1.5 - 4.5 g/dL   Bilirubin Total 0.4 0.0 - 1.2 mg/dL   Alkaline Phosphatase 98 44 - 121 IU/L   AST 42 (H) 0 - 40 IU/L   ALT 63 (H) 0 - 44 IU/L  Hemoglobin A1c     Status: Abnormal   Collection Time: 03/15/23 10:55 AM  Result Value Ref Range   Hgb A1c MFr Bld 6.0 (H) 4.8 - 5.6 %    Comment:          Prediabetes: 5.7 - 6.4          Diabetes: >6.4          Glycemic control for adults with diabetes: <7.0    Est. average glucose Bld gHb Est-mCnc 126 mg/dL  Vitamin F09     Status: None   Collection Time: 03/15/23 10:55 AM  Result Value Ref Range   Vitamin B-12 852 232 - 1,245 pg/mL  CBC with Diff     Status: Abnormal   Collection Time: 03/15/23 10:55 AM  Result Value Ref Range   WBC 7.8 3.4 - 10.8 x10E3/uL   RBC 4.18 4.14 - 5.80 x10E6/uL   Hemoglobin 12.6 (L) 13.0 - 17.7 g/dL   Hematocrit 32.3 55.7 - 51.0 %   MCV 93 79 - 97 fL   MCH 30.1 26.6 - 33.0 pg   MCHC 32.4 31.5 - 35.7 g/dL   RDW 32.2 02.5 - 42.7 %   Platelets 194 150 - 450 x10E3/uL   Neutrophils 80 Not Estab. %   Lymphs 10 Not Estab. %   Monocytes 8 Not Estab. %   Eos 1 Not Estab. %   Basos 0 Not Estab. %   Neutrophils Absolute 6.2 1.4 - 7.0 x10E3/uL   Lymphocytes Absolute 0.8 0.7 - 3.1 x10E3/uL   Monocytes Absolute 0.6 0.1 - 0.9 x10E3/uL   EOS (ABSOLUTE) 0.1 0.0 - 0.4 x10E3/uL   Basophils Absolute 0.0 0.0 - 0.2 x10E3/uL   Immature Granulocytes 1 Not Estab. %   Immature Grans  (Abs) 0.1 0.0 - 0.1 x10E3/uL  Assessment & Plan:   Problem List Items Addressed This Visit       Cardiovascular and Mediastinum   HTN (hypertension) - Primary   Blood pressure well controlled with current medications.  Continue current therapy.  Will reassess at follow up.        Relevant Medications   atorvastatin (LIPITOR) 20 MG tablet   Other Relevant Orders   CMP14+EGFR (Completed)   CBC with Diff (Completed)   Atherosclerotic heart disease of native coronary artery with other forms of angina pectoris [facility])   Patient is seen by Cardiology, who manage this condition.  He is well controlled with current therapy.   Will defer to them for further changes to plan of care.       Relevant Medications   atorvastatin (LIPITOR) 20 MG tablet     Respiratory   OSA (obstructive sleep apnea)   Patient is using cpap regularly.  He is compliant with and benefitting from therapy.   Continue current therapy/settings.        Genitourinary   Stage 3a chronic kidney disease (HCC) (Chronic)   Patient is seen by Nephrology, who manage this condition.  He is well controlled with current therapy.   Will defer to them for further changes to plan of care.       Relevant Orders   CMP14+EGFR (Completed)   CBC with Diff (Completed)     Other   HLD (hyperlipidemia)   Relevant Medications   atorvastatin (LIPITOR) 20 MG tablet   Other Relevant Orders   Lipid panel (Completed)   CMP14+EGFR (Completed)   CBC with Diff (Completed)   Obesity, Class III, BMI 40-49.9 (morbid obesity) (HCC)   Continue current meds.  Will adjust as needed based on results.  The patient is asked to make an attempt to improve diet and exercise patterns to aid in medical management of this problem. Addressed importance of increasing and maintaining water intake.       Prediabetes   A1C Continues to be in prediabetic ranges.  Will reassess at follow up after next lab check.  Patient counseled on  dietary choices and verbalized understanding.  Patient educated on foods that contain carbohydrates and the need to decrease intake.  We discussed prediabetes, and what it means and the need for strict dietary control to prevent progression to type 2 diabetes.  Advised to decrease intake of sugary drinks, including sodas, sweet tea, and some juices, and of starch and sugar heavy foods (ie., potatoes, rice, bread, pasta, desserts). He verbalizes understanding and agreement with the changes discussed today.        Relevant Orders   CMP14+EGFR (Completed)   Hemoglobin A1c (Completed)   CBC with Diff (Completed)   Presence of heart assist device [facility])   Patient is seen by Cardiology/EP, who manage this condition.  He is well controlled with current therapy.   Will defer to them for further changes to plan of care.       Other Visit Diagnoses       Vitamin D deficiency, unspecified       Checking labs today.  Will continue supplements as needed.   Relevant Orders   VITAMIN D 25 Hydroxy (Vit-D Deficiency, Fractures) (Completed)   CMP14+EGFR (Completed)   CBC with Diff (Completed)     B12 deficiency due to diet       Checking labs today.  Will continue supplements as needed.   Relevant Orders   CMP14+EGFR (Completed)   Vitamin B12 (  Completed)   CBC with Diff (Completed)       Return in about 3 months (around 06/13/2023) for F/U.   Total time spent: 20 minutes  Miki Kins, FNP  03/15/2023   This document may have been prepared by [facility] Voice Recognition software and as such may include unintentional dictation errors.

## 2023-03-16 ENCOUNTER — Encounter: Payer: Self-pay | Admitting: Oncology

## 2023-03-16 ENCOUNTER — Other Ambulatory Visit: Payer: Self-pay

## 2023-03-16 LAB — CBC WITH DIFFERENTIAL/PLATELET
Basophils Absolute: 0 10*3/uL (ref 0.0–0.2)
Basos: 0 %
EOS (ABSOLUTE): 0.1 10*3/uL (ref 0.0–0.4)
Eos: 1 %
Hematocrit: 38.9 % (ref 37.5–51.0)
Hemoglobin: 12.6 g/dL — ABNORMAL LOW (ref 13.0–17.7)
Immature Grans (Abs): 0.1 10*3/uL (ref 0.0–0.1)
Immature Granulocytes: 1 %
Lymphocytes Absolute: 0.8 10*3/uL (ref 0.7–3.1)
Lymphs: 10 %
MCH: 30.1 pg (ref 26.6–33.0)
MCHC: 32.4 g/dL (ref 31.5–35.7)
MCV: 93 fL (ref 79–97)
Monocytes Absolute: 0.6 10*3/uL (ref 0.1–0.9)
Monocytes: 8 %
Neutrophils Absolute: 6.2 10*3/uL (ref 1.4–7.0)
Neutrophils: 80 %
Platelets: 194 10*3/uL (ref 150–450)
RBC: 4.18 x10E6/uL (ref 4.14–5.80)
RDW: 13.4 % (ref 11.6–15.4)
WBC: 7.8 10*3/uL (ref 3.4–10.8)

## 2023-03-16 LAB — CMP14+EGFR
ALT: 63 [IU]/L — ABNORMAL HIGH (ref 0–44)
AST: 42 [IU]/L — ABNORMAL HIGH (ref 0–40)
Albumin: 4.3 g/dL (ref 3.9–4.9)
Alkaline Phosphatase: 98 [IU]/L (ref 44–121)
BUN/Creatinine Ratio: 17 (ref 10–24)
BUN: 21 mg/dL (ref 8–27)
Bilirubin Total: 0.4 mg/dL (ref 0.0–1.2)
CO2: 21 mmol/L (ref 20–29)
Calcium: 10.1 mg/dL (ref 8.6–10.2)
Chloride: 106 mmol/L (ref 96–106)
Creatinine, Ser: 1.21 mg/dL (ref 0.76–1.27)
Globulin, Total: 3 g/dL (ref 1.5–4.5)
Glucose: 102 mg/dL — ABNORMAL HIGH (ref 70–99)
Potassium: 5 mmol/L (ref 3.5–5.2)
Sodium: 141 mmol/L (ref 134–144)
Total Protein: 7.3 g/dL (ref 6.0–8.5)
eGFR: 67 mL/min/{1.73_m2} (ref >59)

## 2023-03-16 LAB — LIPID PANEL
Chol/HDL Ratio: 3.9 {ratio} (ref 0.0–5.0)
Cholesterol, Total: 152 mg/dL (ref 100–199)
HDL: 39 mg/dL — ABNORMAL LOW (ref >39)
LDL Chol Calc (NIH): 91 mg/dL (ref 0–99)
Triglycerides: 123 mg/dL (ref 0–149)
VLDL Cholesterol Cal: 22 mg/dL (ref 5–40)

## 2023-03-16 LAB — HEMOGLOBIN A1C
Est. average glucose Bld gHb Est-mCnc: 126 mg/dL
Hgb A1c MFr Bld: 6 % — ABNORMAL HIGH (ref 4.8–5.6)

## 2023-03-16 LAB — VITAMIN B12: Vitamin B-12: 852 pg/mL (ref 232–1245)

## 2023-03-16 LAB — VITAMIN D 25 HYDROXY (VIT D DEFICIENCY, FRACTURES): Vit D, 25-Hydroxy: 45.7 ng/mL (ref 30.0–100.0)

## 2023-03-19 ENCOUNTER — Ambulatory Visit: Payer: 59 | Admitting: Family

## 2023-03-22 ENCOUNTER — Ambulatory Visit: Payer: 59 | Admitting: Family

## 2023-03-25 ENCOUNTER — Encounter: Payer: Self-pay | Admitting: Family

## 2023-03-25 NOTE — Progress Notes (Signed)
Annual Wellness Visit  Patient: Edward Pitts, Male    DOB: Sep 09, 1960, 62 y.o.   MRN: [id number] Visit Date: 03/25/2023  Today's Provider: Miki Kins, FNP  Subjective:    Chief Complaint  Patient presents with   Follow-up   Kylie Lotti is a 62 y.o. male who presents today for his Annual Wellness Visit.   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   Elevated serum creatinine 04/15/2021   Exposure to combat 04/20/2020   Exposure to potentially hazardous substance 09/18/2022   Sep 11, 2022 Entered By: Derinda Late Comment: Entered automatically through [facility] Problem List documentation program     Family history of adverse reaction to anesthesia    PONV mother   GERD (gastroesophageal reflux disease)    H/O Malignant melanoma 09/18/2022   History of complete heart block    PPM placed   Hyperlipidemia    Hypertension    LBBB (left bundle branch block)    Lymphedema of left leg    uses thigh high compression stockings   Melanoma (HCC) 2012   skin cancer, left thigh   OSA on CPAP    PONV (postoperative nausea and vomiting) 04/16/2019   Port-A-Cath in place    RIGHT chest wall   Presence of cardiac pacemaker    [employer]   Problem related to unspecified psychosocial circumstances 09/18/2022   Seizures (HCC)    still has episodes of dizziness. last event 1 month ago (march 2022) and will pass out. takes clonazepam   Past Surgical History:  Procedure Laterality Date   CORONARY ARTERY BYPASS GRAFT N/A 08/10/2021   Procedure: CORONARY ARTERY BYPASS GRAFTING (CABG) X 3 USING LEFT INTERNAL MAMMARY ARTERY AND  RIGHT GREATER SAPHENOUS VEIN;  Surgeon: Lovett Sox, MD;  Location: [facility] OR;  Service: Open Heart Surgery;  Laterality: N/A;   CT RADIATION THERAPY GUIDE     left groin   dental implant     permanent implant   ENDOVEIN HARVEST OF GREATER SAPHENOUS VEIN Right 08/10/2021   Procedure: ENDOVEIN HARVEST OF GREATER SAPHENOUS VEIN;  Surgeon: Lovett Sox, MD;  Location: [facility] OR;  Service: Open Heart Surgery;  Laterality: Right;   KNEE SURGERY Left    arthroscopy   LEFT HEART CATH AND CORONARY ANGIOGRAPHY Left 06/29/2017   Procedure: LEFT HEART CATH AND CORONARY ANGIOGRAPHY;  Surgeon: Lamar Blinks, MD;  Location: [facility] INVASIVE CV LAB;  Service: Cardiovascular;  Laterality: Left;   LEFT HEART CATH AND CORONARY ANGIOGRAPHY N/A 08/08/2021   Procedure: LEFT HEART CATH AND CORONARY ANGIOGRAPHY;  Surgeon: Lamar Blinks, MD;  Location: [facility] INVASIVE CV LAB;  Service: Cardiovascular;  Laterality: N/A;   LYMPH NODE DISSECTION Left 04/16/2019   Procedure: Left inguinal Lymph Node Dissection;  Surgeon: Almond Lint, MD;  Location: [facility] OR;  Service: General;  Laterality: Left;   MELANOMA EXCISION Left 04/16/2019   Procedure: MELANOMA EXCISION LEFT GROIN MASS;  Surgeon: Almond Lint, MD;  Location: [facility] OR;  Service: General;  Laterality: Left;   MELANOMA EXCISION WITH  SENTINEL LYMPH NODE BIOPSY Left 2012   Left calf    PACEMAKER INSERTION N/A 08/26/2018   Procedure: INSERTION PACEMAKER;  Surgeon: Marcina Millard, MD;  Location: [facility] ORS;  Service: Cardiovascular;  Laterality: N/A;   PORTA CATH INSERTION N/A 08/26/2019   Procedure: PORTA CATH INSERTION;  Surgeon: Renford Dills, MD;  Location: [facility] INVASIVE CV LAB;  Service: Cardiovascular;  Laterality: N/A;   SUPERFICIAL LYMPH NODE BIOPSY / EXCISION Left 2020   lymph nodes removed around left groin melanoma site   TEE WITHOUT CARDIOVERSION N/A 08/10/2021   Procedure: TRANSESOPHAGEAL ECHOCARDIOGRAM (TEE);  Surgeon: Lovett Sox, MD;  Location: [facility]  OR;  Service: Open Heart Surgery;  Laterality: N/A;   TEMPORARY PACEMAKER N/A 08/25/2018   Procedure: TEMPORARY PACEMAKER;  Surgeon: Tonny Bollman, MD;  Location: [facility] INVASIVE CV LAB;  Service: Cardiovascular;  Laterality: N/A;   TOTAL HIP ARTHROPLASTY Left 07/14/2020   Procedure: TOTAL HIP ARTHROPLASTY;  Surgeon: Donato Heinz, MD;  Location: [facility] ORS;  Service: Orthopedics;  Laterality: Left;   Family History  Problem Relation Age of Onset   Cancer Paternal Grandmother    Social History   Socioeconomic History   Marital status: Married    Spouse name: [name]    Number of children: 7   Years of education: 12   Highest education level: Not on file  Occupational History    Comment: disability  Tobacco Use   Smoking status: Never   Smokeless tobacco: Never  Vaping Use   Vaping status: Never Used  Substance and Sexual Activity   Alcohol use: No   Drug use: No   Sexual activity: Not Currently  Other Topics Concern   Not on file  Social History Narrative   Lives with  Wife,   Has 2 small dogs   Caffeine use: sodas (2 per day)      Out of work on disability.  Has a walk in shower. No stairs to climb   Oncology treatment ongoing. Uses port a cath for treatment.      pacemaker   Social Drivers of Health   Financial Resource Strain: Low Risk  (02/12/2019)   Overall Financial Resource Strain (CARDIA)    Difficulty of Paying Living Expenses: Not hard at all  Food Insecurity: No Food Insecurity (03/15/2023)   Hunger Vital Sign    Worried About Running Out of Food in the Last Year: Never true    Ran Out of Food in the Last Year: Never true  Transportation Needs: No Transportation Needs (03/15/2023)   PRAPARE - Administrator, Civil Service (Medical): No    Lack of Transportation (Non-Medical): No  Physical Activity: Inactive (03/15/2023)   Exercise Vital Sign    Days of Exercise per Week: 0 days    Minutes of Exercise per Session: 0 min  Stress: No Stress  Concern Present (03/15/2023)   [employer] of Occupational Health - Occupational Stress Questionnaire    Feeling of Stress : Not at all  Social Connections: Unknown (02/12/2019)   Social Connection and Isolation Panel [NHANES]    Frequency of Communication with Friends and Family: More than three times a week    Frequency of Social Gatherings with Friends and Family: Not on file    Attends Religious Services: Not on file    Active Member of Clubs or Organizations: Not on file    Attends Banker Meetings: Not on file    Marital Status: Married  Intimate Partner Violence: Not  At Risk (03/15/2023)   Humiliation, Afraid, Rape, and Kick questionnaire    Fear of Current or Ex-Partner: No    Emotionally Abused: No    Physically Abused: No    Sexually Abused: No    Medications: Outpatient Medications Prior to Visit  Medication Sig   atorvastatin (LIPITOR) 20 MG tablet Take 20 mg by mouth daily.   ALPRAZolam (XANAX) 0.5 MG tablet Take 1 tablet (0.5 mg total) by mouth 2 (two) times daily as needed for anxiety.   apixaban (ELIQUIS) 5 MG TABS tablet Take 5 mg by mouth 2 (two) times daily.   Carboxymeth-Glyc-Polysorb PF (REFRESH OPTIVE MEGA-3) 0.5-1-0.5 % SOLN Place 1 drop into both eyes daily as needed (dry eyes).   clonazePAM (KLONOPIN) 0.5 MG tablet TAKE 1 TABLET BY MOUTH IN THE MORNING AND 1 BY MOUTH IN THE EVENING PER PT   clotrimazole (CLOTRIMAZOLE ANTI-FUNGAL) 1 % cream Apply 1 Application topically 2 (two) times daily. Apply to the L inguinal fold   febuxostat (ULORIC) 40 MG tablet Take 40 mg by mouth daily.   hydrocortisone 2.5 % ointment Apply 1 application. topically daily as needed (itching).   Lacosamide 100 MG TABS Take 1 tablet (100 mg total) by mouth in the morning and at bedtime.   lidocaine-prilocaine (EMLA) cream Apply 1 Application topically as needed. Apply small amount of cream to port site approx 1-2 hours prior to appointment.   lisinopril (ZESTRIL) 10 MG  tablet Take 10 mg by mouth daily.   metoprolol succinate (TOPROL-XL) 25 MG 24 hr tablet Take 25 mg by mouth daily.   Multiple Vitamin (MULTIVITAMIN WITH MINERALS) TABS tablet Take 1 tablet by mouth daily. Centrum Silver   nystatin (MYCOSTATIN/NYSTOP) powder Apply 1 Application topically 3 (three) times daily.   omeprazole (PRILOSEC) 20 MG capsule TAKE 1 CAPSULE BY MOUTH EVERY DAY   ondansetron (ZOFRAN) 4 MG tablet TAKE 1 TABLET BY MOUTH EVERY 8 HOURS AS NEEDED FOR NAUSEA AND VOMITING   traZODone (DESYREL) 50 MG tablet Take 50 mg by mouth at bedtime as needed.   [DISCONTINUED] atorvastatin (LIPITOR) 40 MG tablet Take 0.5 tablets by mouth at bedtime.   Facility-Administered Medications Prior to Visit  Medication Dose Route Frequency Provider   heparin lock flush 100 UNIT/ML injection        heparin lock flush 100 UNIT/ML injection        heparin lock flush 100 unit/mL  500 Units Intravenous Once Rickard Patience, MD   sodium chloride flush (NS) 0.9 % injection 10 mL  10 mL Intravenous PRN Rickard Patience, MD    Allergies  Allergen Reactions   Ibuprofen Other (See Comments)    Affects kidneys   Levetiracetam Rash and Hives    Other reaction(s): Dizziness, Headache  Other Reaction(s): Dizziness, Headache, Other (see comments), Unknown  Dizziness & headaches   Nsaids     Affects kidneys   Capecitabine Rash    Other Reaction(s): Eruption of skin    Patient Care Team: Miki Kins, FNP as PCP - General (Family Medicine) Rickard Patience, MD as Consulting Physician (Oncology) Barbaraann Cao Georgeanna Lea, MD as Consulting Physician (Oncology) Lamar Blinks, MD as Consulting Physician (Cardiology) Miki Kins, FNP (Family Medicine)  Review of Systems  All other systems reviewed and are negative.      Objective:    Vitals: BP (!) 141/84   Pulse 70   Ht 5\' 7"  (1.702 m)   Wt 250 lb (113.4 kg)   SpO2 96%  BMI 39.16 kg/m    Physical Exam Vitals and nursing note reviewed.  Constitutional:       Appearance: Normal appearance. He is normal weight.  Eyes:     Extraocular Movements: Extraocular movements intact.     Pupils: Pupils are equal, round, and reactive to light.  Cardiovascular:     Rate and Rhythm: Normal rate and regular rhythm.     Pulses: Normal pulses.     Heart sounds: Normal heart sounds.  Pulmonary:     Effort: Pulmonary effort is normal.     Breath sounds: Normal breath sounds.  Musculoskeletal:        General: Normal range of motion.  Neurological:     General: No focal deficit present.     Mental Status: He is alert and oriented to person, place, and time.  Psychiatric:        Mood and Affect: Mood normal.        Behavior: Behavior normal.        Thought Content: Thought content normal.        Judgment: Judgment normal.       Most recent functional status assessment:    07/25/2022    8:00 AM  In your present state of health, do you have any difficulty performing the following activities:  Hearing? 0  Vision? 0  Difficulty concentrating or making decisions? 0  Walking or climbing stairs? 0  Dressing or bathing? 0  Doing errands, shopping? 0    Most recent fall risk assessment:    09/14/2021    9:36 AM  Fall Risk   Falls in the past year? 0  Number falls in past yr: 0  Injury with Fall? 0  Risk for fall due to : Impaired mobility  Risk for fall due to: Comment uses cain as needed  Follow up Falls evaluation completed;Education provided;Falls prevention discussed     Most recent depression screenings:    11/25/2021    8:24 AM 09/19/2021   12:01 PM  PHQ 2/9 Scores  PHQ - 2 Score 0 0  PHQ- 9 Score 5 4    Most recent cognitive screening:     No data to display          Results for orders placed or performed in visit on 03/15/23  Lipid panel  Result Value Ref Range   Cholesterol, Total 152 100 - 199 mg/dL   Triglycerides 829 0 - 149 mg/dL   HDL 39 (L) >56 mg/dL   VLDL Cholesterol Cal 22 5 - 40 mg/dL   LDL Chol Calc (NIH)  91 0 - 99 mg/dL   Chol/HDL Ratio 3.9 0.0 - 5.0 ratio  VITAMIN D 25 Hydroxy (Vit-D Deficiency, Fractures)  Result Value Ref Range   Vit D, 25-Hydroxy 45.7 30.0 - 100.0 ng/mL  CMP14+EGFR  Result Value Ref Range   Glucose 102 (H) 70 - 99 mg/dL   BUN 21 8 - 27 mg/dL   Creatinine, Ser 2.13 0.76 - 1.27 mg/dL   eGFR 67 >08 MV/HQI/6.96   BUN/Creatinine Ratio 17 10 - 24   Sodium 141 134 - 144 mmol/L   Potassium 5.0 3.5 - 5.2 mmol/L   Chloride 106 96 - 106 mmol/L   CO2 21 20 - 29 mmol/L   Calcium 10.1 8.6 - 10.2 mg/dL   Total Protein 7.3 6.0 - 8.5 g/dL   Albumin 4.3 3.9 - 4.9 g/dL   Globulin, Total 3.0 1.5 - 4.5 g/dL   Bilirubin Total 0.4  0.0 - 1.2 mg/dL   Alkaline Phosphatase 98 44 - 121 IU/L   AST 42 (H) 0 - 40 IU/L   ALT 63 (H) 0 - 44 IU/L  Hemoglobin A1c  Result Value Ref Range   Hgb A1c MFr Bld 6.0 (H) 4.8 - 5.6 %   Est. average glucose Bld gHb Est-mCnc 126 mg/dL  Vitamin J47  Result Value Ref Range   Vitamin B-12 852 232 - 1,245 pg/mL  CBC with Diff  Result Value Ref Range   WBC 7.8 3.4 - 10.8 x10E3/uL   RBC 4.18 4.14 - 5.80 x10E6/uL   Hemoglobin 12.6 (L) 13.0 - 17.7 g/dL   Hematocrit 82.9 56.2 - 51.0 %   MCV 93 79 - 97 fL   MCH 30.1 26.6 - 33.0 pg   MCHC 32.4 31.5 - 35.7 g/dL   RDW 13.0 86.5 - 78.4 %   Platelets 194 150 - 450 x10E3/uL   Neutrophils 80 Not Estab. %   Lymphs 10 Not Estab. %   Monocytes 8 Not Estab. %   Eos 1 Not Estab. %   Basos 0 Not Estab. %   Neutrophils Absolute 6.2 1.4 - 7.0 x10E3/uL   Lymphocytes Absolute 0.8 0.7 - 3.1 x10E3/uL   Monocytes Absolute 0.6 0.1 - 0.9 x10E3/uL   EOS (ABSOLUTE) 0.1 0.0 - 0.4 x10E3/uL   Basophils Absolute 0.0 0.0 - 0.2 x10E3/uL   Immature Granulocytes 1 Not Estab. %   Immature Grans (Abs) 0.1 0.0 - 0.1 x10E3/uL       Assessment & Plan:      Annual wellness visit done today including the all of the following: Reviewed patient's Family Medical History Reviewed and updated list of patient's medical  providers Assessment of cognitive impairment was done Assessed patient's functional ability Established a written schedule for health screening services Health Risk Assessent Completed and Reviewed  Exercise Activities and Dietary recommendations  Goals   None     Immunization History  Administered Date(s) Administered   Adenovirus 12/10/1986   Anthrax 02/21/1997, 03/20/2001, 03/22/2001, 04/05/2001, 10/08/2001, 07/10/2002   Hepatitis A, Adult 05/12/1996, 04/27/1997   Hepatitis B, ADULT 12/02/2003, 08/03/2004, 08/14/2006   Influenza Inj Mdck Quad With Preservative 12/30/2017   Influenza Nasal 02/01/2005, 02/02/2006   Influenza Whole 02/10/1996   Influenza, Mdck, Trivalent,PF 6+ MOS(egg free) 11/30/2022   Influenza, Quadrivalent, Recombinant, Inj, Pf 01/16/2022   Influenza,inj,Quad PF,6+ Mos 12/16/2016, 12/23/2018, 12/24/2018   Influenza-Unspecified 02/01/2005, 01/26/2016, 01/20/2022   MMR 12/10/1986, 07/13/1989, 08/03/2004   Meningococcal Conjugate 08/14/2006   Meningococcal polysaccharide vaccine (MPSV4) 12/10/1986   Moderna Covid-19 Fall Seasonal Vaccine 59yrs & older 11/30/2022   OPV 12/10/1986   PFIZER Comirnaty(Gray Top)Covid-19 Tri-Sucrose Vaccine 09/15/2020   PFIZER(Purple Top)SARS-COV-2 Vaccination 06/15/2019, 07/13/2019, 12/12/2019   PPD Test 08/25/2018   Pneumococcal Polysaccharide-23 08/18/2016, 08/28/2018   Smallpox 05/30/2001   Td 02/15/1993, 06/17/2003   Tdap 08/10/2014, 12/26/2015   Typhoid Inactivated 06/17/2003, 08/08/2005   Typhoid Parenteral 11/08/1999, 08/12/2001, 08/08/2005   Yellow Fever 11/17/1999   Zoster Recombinant(Shingrix) 01/28/2020, 05/27/2020    Health Maintenance  Topic Date Due   Colonoscopy  Never done   COVID-19 Vaccine (6 - 2024-25 season) 01/25/2023   Medicare Annual Wellness (AWV)  03/14/2024   DTaP/Tdap/Td (5 - Td or Tdap) 12/25/2025   INFLUENZA VACCINE  Completed   Hepatitis C Screening  Completed   HIV Screening  Completed    Zoster Vaccines- Shingrix  Completed   HPV VACCINES  Aged Out     Discussed health  benefits of physical activity, and encouraged him to engage in regular exercise appropriate for his age and condition.         Miki Kins, FNP   03/15/2023  This document may have been prepared by Dragon Voice Recognition software and as such may include unintentional dictation errors.

## 2023-03-26 ENCOUNTER — Telehealth: Payer: Self-pay | Admitting: Family

## 2023-03-26 NOTE — Telephone Encounter (Signed)
Patient called requesting we send a referral to Dr. Kennyth Arnold.  [address] [place], [place] [address]  This doctor is a sleep specialist and needs a referral to him. He may need a new sleep study done.

## 2023-04-10 DIAGNOSIS — E875 Hyperkalemia: Secondary | ICD-10-CM | POA: Diagnosis not present

## 2023-04-10 DIAGNOSIS — D631 Anemia in chronic kidney disease: Secondary | ICD-10-CM | POA: Diagnosis not present

## 2023-04-10 DIAGNOSIS — I129 Hypertensive chronic kidney disease with stage 1 through stage 4 chronic kidney disease, or unspecified chronic kidney disease: Secondary | ICD-10-CM | POA: Diagnosis not present

## 2023-04-10 DIAGNOSIS — N1831 Chronic kidney disease, stage 3a: Secondary | ICD-10-CM | POA: Diagnosis not present

## 2023-04-12 DIAGNOSIS — Z79899 Other long term (current) drug therapy: Secondary | ICD-10-CM | POA: Diagnosis not present

## 2023-04-12 DIAGNOSIS — M1A00X0 Idiopathic chronic gout, unspecified site, without tophus (tophi): Secondary | ICD-10-CM | POA: Diagnosis not present

## 2023-04-25 ENCOUNTER — Other Ambulatory Visit: Payer: Self-pay | Admitting: Internal Medicine

## 2023-04-25 ENCOUNTER — Encounter: Payer: Self-pay | Admitting: Oncology

## 2023-04-25 DIAGNOSIS — I483 Typical atrial flutter: Secondary | ICD-10-CM | POA: Diagnosis not present

## 2023-04-25 DIAGNOSIS — R569 Unspecified convulsions: Secondary | ICD-10-CM

## 2023-04-26 ENCOUNTER — Encounter: Payer: Self-pay | Admitting: Oncology

## 2023-04-26 DIAGNOSIS — G4733 Obstructive sleep apnea (adult) (pediatric): Secondary | ICD-10-CM | POA: Insufficient documentation

## 2023-04-26 NOTE — Assessment & Plan Note (Signed)
Patient is using cpap regularly.  He is compliant with and benefitting from therapy.   Continue current therapy/settings.

## 2023-05-01 DIAGNOSIS — Z8509 Personal history of malignant neoplasm of other digestive organs: Secondary | ICD-10-CM | POA: Diagnosis not present

## 2023-05-01 DIAGNOSIS — Z08 Encounter for follow-up examination after completed treatment for malignant neoplasm: Secondary | ICD-10-CM | POA: Diagnosis not present

## 2023-05-01 DIAGNOSIS — C221 Intrahepatic bile duct carcinoma: Secondary | ICD-10-CM | POA: Diagnosis not present

## 2023-05-01 DIAGNOSIS — K760 Fatty (change of) liver, not elsewhere classified: Secondary | ICD-10-CM | POA: Diagnosis not present

## 2023-05-01 NOTE — Progress Notes (Signed)
 Name: Edward Pitts MRN: [id number] DOB: 29-Jan-1961    CHIEF COMPLAINT:  EXCESSIVE DAYTIME SLEEPINESS Assessment of OSA   HISTORY OF PRESENT ILLNESS: Patient is seen today for problems and issues with sleep related to excessive daytime sleepiness Patient  has been having sleep problems for many years Patient has been having excessive daytime sleepiness for a long time Patient has been having extreme fatigue and tiredness, lack of energy +  very Loud snoring every night + struggling breathe at night and gasps for air + morning headaches + Nonrefreshing sleep  Patient diagnosed with sleep apnea several years ago Patient currently states that he is using CPAP on a nightly basis Patient reports compliance Patient uses fullface mask   Discussed sleep data and reviewed with patient.  Encouraged proper weight management.  Discussed driving precautions and its relationship with hypersomnolence.  Discussed operating dangerous equipment and its relationship with hypersomnolence.  Discussed sleep hygiene, and benefits of a fixed sleep waked time.  The importance of getting eight or more hours of sleep discussed with patient.  Discussed limiting the use of the computer and television before bedtime.  Decrease naps during the day, so night time sleep will become enhanced.  Limit caffeine, and sleep deprivation.  HTN, stroke, and heart failure are potential risk factors.   Patient's current CPAP machine is unable to provide us  any information DME company [employer] was contacted several times by our nurse navigator They also have been having issues with his download and machine I have asked for possible loaner to be given to patient so we can review his compliance I recommend a new machine   No evidence of heart failure at this time No evidence or signs of infection at this time No respiratory distress No fevers, chills, nausea, vomiting, diarrhea No evidence of lower extremity  edema No evidence hemoptysis   Pulmonary function test reviewed in detail with patient 2023 FEV1 to FVC ratio is 73% predicted FEV1 was 71% predicted FVC was 73% predicted Expiratory flow volume loops showed obstructive pattern ERV elevated at 111% predicted suggesting air trapping  PAST MEDICAL HISTORY :   has a past medical history of AKI (acute kidney injury) (HCC) (08/24/2018), Anemia, Anxiety, Aortic atherosclerosis (HCC), Arthritis, Cancer of groin (HCC) (2021), Cataract, Complication of anesthesia, Coronary artery disease, Dizziness (12/22/2019), Dizziness of unknown etiology, Elevated serum creatinine (04/15/2021), Exposure to combat (04/20/2020), Exposure to potentially hazardous substance (09/18/2022), Family history of adverse reaction to anesthesia, GERD (gastroesophageal reflux disease), H/O Malignant melanoma (09/18/2022), History of complete heart block, Hyperlipidemia, Hypertension, LBBB (left bundle branch block), Lymphedema of left leg, Melanoma (HCC) (2012), OSA on CPAP, PONV (postoperative nausea and vomiting) (04/16/2019), Port-A-Cath in place, Presence of cardiac pacemaker, Problem related to unspecified psychosocial circumstances (09/18/2022), and Seizures (HCC).  has a past surgical history that includes Knee surgery (Left); LEFT HEART CATH AND CORONARY ANGIOGRAPHY (Left, 06/29/2017); TEMPORARY PACEMAKER (N/A, 08/25/2018); Pacemaker insertion (N/A, 08/26/2018); Melanoma excision with sentinel lymph node dissection (Left, 2012); Melanoma excision (Left, 04/16/2019); Lymph node dissection (Left, 04/16/2019); PORTA CATH INSERTION (N/A, 08/26/2019); Superficial lymph node biopsy / excision (Left, 2020); dental implant; CT RADIATION THERAPY GUIDE; Total hip arthroplasty (Left, 07/14/2020); LEFT HEART CATH AND CORONARY ANGIOGRAPHY (N/A, 08/08/2021); Coronary artery bypass graft (N/A, 08/10/2021); TEE without cardioversion (N/A, 08/10/2021); and Endoharvest vein of greater saphenous vein (Right,  08/10/2021). Prior to Admission medications   Medication Sig Start Date End Date Taking? Authorizing Provider  ALPRAZolam  (XANAX ) 0.5 MG tablet Take 1 tablet (0.5 mg  total) by mouth 2 (two) times daily as needed for anxiety. 08/23/22   Babara Call, MD  apixaban  (ELIQUIS ) 5 MG TABS tablet Take 5 mg by mouth 2 (two) times daily.    [provider]  atorvastatin  (LIPITOR) 20 MG tablet Take 20 mg by mouth daily. 03/08/23   [provider]  Carboxymeth-Glyc-Polysorb PF (REFRESH OPTIVE MEGA-3) 0.5-1-0.5 % SOLN Place 1 drop into both eyes daily as needed (dry eyes).    [provider]  clonazePAM  (KLONOPIN ) 0.5 MG tablet TAKE 1 TABLET BY MOUTH IN THE MORNING AND 1 BY MOUTH IN THE EVENING PER PT 02/06/23   Vaslow, Zachary K, MD  clotrimazole  (CLOTRIMAZOLE  ANTI-FUNGAL) 1 % cream Apply 1 Application topically 2 (two) times daily. Apply to the L inguinal fold 06/07/22   Clide Burnard Ee, MD  febuxostat  (ULORIC ) 40 MG tablet Take 40 mg by mouth daily.    [provider]  hydrocortisone  2.5 % ointment Apply 1 application. topically daily as needed (itching).    [provider]  Lacosamide  100 MG TABS TAKE 1 TABLET (100 MG TOTAL) BY MOUTH IN THE MORNING AND AT BEDTIME. 04/26/23   Vaslow, Zachary K, MD  lidocaine -prilocaine  (EMLA ) cream Apply 1 Application topically as needed. Apply small amount of cream to port site approx 1-2 hours prior to appointment. 11/01/22   Babara Call, MD  lisinopril  (ZESTRIL ) 10 MG tablet Take 10 mg by mouth daily.    [provider]  metoprolol  succinate (TOPROL -XL) 25 MG 24 hr tablet Take 25 mg by mouth daily. 12/15/18   [provider]  Multiple Vitamin (MULTIVITAMIN WITH MINERALS) TABS tablet Take 1 tablet by mouth daily. Centrum Silver     [provider]  nystatin  (MYCOSTATIN /NYSTOP ) powder Apply 1 Application topically 3 (three) times daily. 06/14/22   Babara Call, MD  omeprazole  (PRILOSEC) 20 MG capsule TAKE 1 CAPSULE BY  MOUTH EVERY DAY 02/26/23   Babara Call, MD  ondansetron  (ZOFRAN ) 4 MG tablet TAKE 1 TABLET BY MOUTH EVERY 8 HOURS AS NEEDED FOR NAUSEA AND VOMITING 01/26/22   Borders, Joshua R, NP  traZODone  (DESYREL ) 50 MG tablet Take 50 mg by mouth at bedtime as needed. 08/05/22   [provider]   Allergies  Allergen Reactions   Ibuprofen  Other (See Comments)    Affects kidneys   Levetiracetam  Rash and Hives    Other reaction(s): Dizziness, Headache  Other Reaction(s): Dizziness, Headache, Other (see comments), Unknown  Dizziness & headaches   Nsaids     Affects kidneys   Capecitabine  Rash    Other Reaction(s): Eruption of skin    FAMILY HISTORY:  family history includes Cancer in his paternal grandmother. SOCIAL HISTORY:  reports that he has never smoked. He has never used smokeless tobacco. He reports that he does not drink alcohol  and does not use drugs.   Review of Systems:  Gen:  Denies  fever, sweats, chills weight loss  HEENT: Denies blurred vision, double vision, ear pain, eye pain, hearing loss, nose bleeds, sore throat Cardiac:  No dizziness, chest pain or heaviness, chest tightness,edema, No JVD Resp:   No cough, -sputum production, -shortness of breath,-wheezing, -hemoptysis,  Gi: Denies swallowing difficulty, stomach pain, nausea or vomiting, diarrhea, constipation, bowel incontinence Gu:  Denies bladder incontinence, burning urine Ext:   Denies Joint pain, stiffness or swelling Skin: Denies  skin rash, easy bruising or bleeding or hives Endoc:  Denies polyuria, polydipsia , polyphagia or weight change Psych:   Denies depression, insomnia or  hallucinations  Other:  All other systems negative   ALL OTHER ROS ARE NEGATIVE   BP 124/66 (BP Location: Left Arm, Patient Position: Sitting, Cuff Size: Normal)   Pulse 63   Temp 97.6 F (36.4 C) (Temporal)   Ht 5' 7 (1.702 m)   Wt 257 lb 12.8 oz (116.9 kg)   SpO2 97%   BMI 40.38 kg/m \   Physical Examination:    General Appearance: No distress  EYES PERRLA, EOM intact.   NECK Supple, No JVD ORAL CAVITY MALLAMPATI 4 Pulmonary: normal breath sounds, No wheezing.  CardiovascularNormal S1,S2.  No m/r/g.   Abdomen: Benign, Soft, non-tender. Skin:   warm, no rashes, no ecchymosis  Extremities: normal, no cyanosis, clubbing. Neuro:without focal findings,  speech normal  PSYCHIATRIC: Mood, affect within normal limits.   ALL OTHER ROS ARE NEGATIVE    ASSESSMENT AND PLAN SYNOPSIS  63 year old white male patient with signs and symptoms of excessive daytime sleepiness with probable underlying diagnosis of obstructive sleep apnea in the setting of obesity and deconditioned state  Underlying OSA Difficult to assess compliance due to the fact his machine is not able to provide us  a download DME company is also having a hard time obtaining this information I have suggested that they give him a loaner machine until we can get a compliance Be aware of reduced alertness and do not drive or operate heavy machinery if experiencing this or drowsiness.  Exercise encouraged, as tolerated. Encouraged proper weight management.  Important to get eight or more hours of sleep  Limiting the use of the computer and television before bedtime.  Decrease naps during the day, so night time sleep will become enhanced.  Limit caffeine, and sleep deprivation.  HTN, stroke, uncontrolled diabetes and heart failure are potential risk factors.  Risk of untreated sleep apnea including cardiac arrhthymias, stroke, DM, pulm HTN.     Obesity -recommend significant weight loss -recommend changing diet  Deconditioned state -Recommend increased daily activity and exercise     MEDICATION ADJUSTMENTS/LABS AND TESTS ORDERED: Continue CPAP as prescribed Avoid Allergens and Irritants Avoid secondhand smoke Avoid SICK contacts Recommend  Masking  when appropriate Recommend Keep up-to-date with vaccinations DME company  to follow-up with patient   CURRENT MEDICATIONS REVIEWED AT LENGTH WITH PATIENT TODAY   Patient  satisfied with Plan of action and management. All questions answered  Follow up  3 months  I spent a total of  62 minutes reviewing chart data, face-to-face evaluation with the patient, counseling and coordination of care as detailed above.    Nickolas Alm Cellar, M.D.  [facility]  Medical Director [facility] Medical Director [facility] Cardio-Pulmonary Department

## 2023-05-02 ENCOUNTER — Ambulatory Visit: Payer: 59 | Admitting: Internal Medicine

## 2023-05-02 ENCOUNTER — Encounter: Payer: Self-pay | Admitting: Internal Medicine

## 2023-05-02 VITALS — BP 124/66 | HR 63 | Temp 97.6°F | Ht 67.0 in | Wt 257.8 lb

## 2023-05-02 DIAGNOSIS — I4811 Longstanding persistent atrial fibrillation: Secondary | ICD-10-CM

## 2023-05-02 DIAGNOSIS — G4733 Obstructive sleep apnea (adult) (pediatric): Secondary | ICD-10-CM | POA: Diagnosis not present

## 2023-05-02 NOTE — Patient Instructions (Addendum)
 Continue CPAP as prescribed We will call you with reports Recommend weight loss Follow-up with cardiology  Avoid Allergens and Irritants Avoid secondhand smoke Avoid SICK contacts Recommend  Masking  when appropriate Recommend Keep up-to-date with vaccinations   Be aware of reduced alertness and do not drive or operate heavy machinery if experiencing this or drowsiness.  Exercise encouraged, as tolerated. Encouraged proper weight management.  Important to get eight or more hours of sleep  Limiting the use of the computer and television before bedtime.  Decrease naps during the day, so night time sleep will become enhanced.  Limit caffeine, and sleep deprivation.

## 2023-05-03 ENCOUNTER — Other Ambulatory Visit: Payer: Self-pay

## 2023-05-09 ENCOUNTER — Encounter: Payer: Self-pay | Admitting: Oncology

## 2023-05-09 ENCOUNTER — Inpatient Hospital Stay: Payer: 59 | Attending: Oncology | Admitting: Oncology

## 2023-05-09 VITALS — BP 150/64 | HR 79 | Temp 97.4°F | Resp 18 | Wt 258.8 lb

## 2023-05-09 DIAGNOSIS — Z85820 Personal history of malignant melanoma of skin: Secondary | ICD-10-CM | POA: Diagnosis not present

## 2023-05-09 DIAGNOSIS — N1832 Chronic kidney disease, stage 3b: Secondary | ICD-10-CM

## 2023-05-09 DIAGNOSIS — N1831 Chronic kidney disease, stage 3a: Secondary | ICD-10-CM | POA: Diagnosis not present

## 2023-05-09 DIAGNOSIS — Z08 Encounter for follow-up examination after completed treatment for malignant neoplasm: Secondary | ICD-10-CM | POA: Insufficient documentation

## 2023-05-09 DIAGNOSIS — Z95828 Presence of other vascular implants and grafts: Secondary | ICD-10-CM | POA: Diagnosis not present

## 2023-05-09 DIAGNOSIS — C221 Intrahepatic bile duct carcinoma: Secondary | ICD-10-CM

## 2023-05-09 DIAGNOSIS — Z8505 Personal history of malignant neoplasm of liver: Secondary | ICD-10-CM | POA: Insufficient documentation

## 2023-05-09 DIAGNOSIS — C438 Malignant melanoma of overlapping sites of skin: Secondary | ICD-10-CM | POA: Diagnosis not present

## 2023-05-09 DIAGNOSIS — D631 Anemia in chronic kidney disease: Secondary | ICD-10-CM | POA: Diagnosis not present

## 2023-05-09 NOTE — Assessment & Plan Note (Signed)
Patient has no radiographic evidence of metastatic melanoma on recent CT scan done at [facility] immunotherapy. Continue surveillance

## 2023-05-09 NOTE — Assessment & Plan Note (Signed)
Encourage oral hydration and avoid nephrotoxins.

## 2023-05-09 NOTE — Progress Notes (Signed)
Hematology/Oncology Progress note Telephone:([phone]) [phone] Fax:([phone]) [phone]    CHIEF COMPLAINTS/REASON FOR VISIT:  Follow up for melanoma, cholangiocarcinoma.    ASSESSMENT & PLAN:   Cancer Staging  Cholangiocarcinoma [facility]) Staging form: Intrahepatic Bile Duct, AJCC 8th Edition - Pathologic stage from 01/25/2022: Stage Unknown (pT1a, pNX, cM0) - Signed by Rickard Patience, MD on 02/21/2022  Malignant melanoma of overlapping sites [facility]) Staging form: Melanoma of the Skin, AJCC 8th Edition - Pathologic: Stage Unknown (rpTX, pN1b, cM0) - Signed by Rickard Patience, MD on 07/27/2020 - Pathologic: No stage assigned - Unsigned   Cholangiocarcinoma (HCC) T1a Nx Cholangiocarcinoma Pathology was reviewed and discussed with patient.  S/p chemotherapy Xeloda 825mg /m2 BID+ concurrent RT Previously on adjuvant Xeloda [1000 mg/m2 BID for 14 of every 21 days] -dose reduced to 825mg /m2 BID due to skin rash, still not able to tolerate- discontinue.--> s/p 6 cycles gemcitabine weekly, 2 weeks on, 1 week off, [adjustment due to cytopenia] He is doing very well  Labs are reviewed and discussed with patient.  05/01/23  surveillance CT scan  at [facility]- no recurrence. Tumor markers stable.  Continue surveillance.    Malignant melanoma of overlapping sites [facility]) Patient has no radiographic evidence of metastatic melanoma on recent CT scan done at [facility] immunotherapy. 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  Stage 3a chronic kidney disease (HCC) Encourage oral hydration and avoid nephrotoxins.      Follow up in 4 months.  All questions were answered. The patient knows to call the clinic with any problems, questions or concerns.  Rickard Patience, MD, PhD [facility] Hematology Oncology 05/09/2023    HISTORY OF PRESENTING ILLNESS:   Edward Pitts is a  63 y.o.  male presents for recurrent malignant melanoma.   Oncology History   Malignant melanoma of overlapping sites [facility])  04/16/2019 Cancer Staging   Staging form: Melanoma of the Skin, AJCC 8th Edition - Pathologic: Stage Unknown (rpTX, pN1b, cM0) - Signed by Rickard Patience, MD on 07/27/2020 Stage prefix: Recurrence    04/23/2019 Initial Diagnosis   Malignant melanoma   -He has a history of left lower extremity melanoma in 2011, status post local excision -04/16/2019 patient underwent left groin mass resection  Resection pathology showed malignant melanoma, replacing a lymph node, with extracapsular extension, peripheral and deep margins involved.  Left inguinal contents, all 7 lymph nodes were negative for melanoma in the lymph nodes. Extranodal melanoma identified in lymphatic and interstitium between nodes -PDL1 80% TPS    07/07/2019 -  Radiation Therapy   status post adjuvant radiation.   07/23/2019 - 07/21/2021 Chemotherapy   Nivolumab q14d      06/29/2020 Imaging   CT chest abdomen pelvis showed stable postoperative appearance of the left groin.  No evidence of local recurrence.  No evidence of metastatic disease in the chest abdomen or pelvis.  Hepatic steatosis.  Stable subcentimeter fluid attenuation lesion of the lateral right lobe of the liver, likely benign cyst or hemangioma.  Coronary artery disease.  Aortic atherosclerosis   10/20/2020 Imaging   CT chest abdomen pelvis showed stable postoperative/radiation appearance of the left groin.  No evidence of local recurrence/metastatic disease within the chest abdomen/pelvis.  Fatty liver disease.  Diverticulosis without evidence of typhlitis.  Aortic atherosclerosis   03/10/2021 Imaging   MRI brain without contrast showed no definitive evidence of intracranial metastatic disease.  Study is limited by absence of intravenous contrast.  04/26/2021 Imaging   CT chest abdomen pelvis without contrast showed stable post operative changes of left groin with no evidence of recurrent disease.  No evidence of metastatic  disease in the chest abdomen pelvis.  Aortic atherosclerosis   08/08/2021 - 08/16/2021 Hospital Admission    patient was hospitalized due to NSTEMI status post CABG x3.  He also had pacemaker The echocardiogram showed left ventricular ejection fraction of 65 to 70%,   09/15/2021 Imaging   CT chest abdomen pelvis w contrast  IMPRESSION: 1. Subtle hypodense 9 mm lesion in the left lobe of the liver is new from prior imaging including previous contrasted CT dating back to December 10, 2019, with the lesion appearing to equilibrate with background liver on delayed imaging sequence but is incompletely evaluated on this imaging study and technically nonspecific possibly reflecting a benign perfusional variant and while its appearance is not typical for that of a melanoma metastasis, it is not excluded on this examination. Suggest more definitive characterization by hepatic protocol MRI with and without contrast. 2. Stable postoperative changes in the left groin without evidence of local recurrent disease.3. No evidence of metastatic disease in the chest or pelvis.4.  Aortic Atherosclerosis (ICD10-I70.0).      09/23/2021 Imaging   Contrast-enhanced liver ultrasound Mildly hypoenhancing 2.2 cm mass in the posterior aspect of the left lobe of the liver with washout characteristics concerning for non hepatocellular malignancy, concerning for melanotic metastasis given history. The lesion is in an unfavorable location for percutaneous biopsy. Consider PET-CT for further characterization   10/21/2021 -  Chemotherapy   Nivolumab q14d      11/10/2021 Imaging   MRI Brain w wo contrast Negative for metastatic disease to the brain   11/10/2021 Imaging   MRI abdomen w wo contrast 1. Lesion of the posterior superior left lobe of the liver, hepatic segment II, measuring 1.9 x 1.8 cm corresponding to findings of prior imaging. Evaluation is somewhat limited breath motion artifact however there is subtle associated  rim enhancement of this lesion. Findings are most in keeping with a hepatic metastasis in the setting of known recurrent melanoma. 2. Mild hepatic steatosis. 3. Cardiomegaly.    02/27/2022 - 02/27/2022 Chemotherapy   Patient is on Treatment Plan : Capecitabine (825 mg/m2 bid) + XRT     07/12/2022 -  Chemotherapy   Patient is on Treatment Plan : PANCREAS Gemcitabine D1,8(1000) q21d     Cholangiocarcinoma (HCC)  12/19/2021 Initial Diagnosis   Liver biopsy showed carcinoma  -The slides on the patient's prior left inguinal lymph node biopsy ([id number]) with metastatic melanoma were reviewed in conjunction with  this case. The morphology of the malignant cells between the two cases are dissimilar. A limited panel of immunohistochemical stains was performed and the neoplastic cells are positive for superpancytokeratin, cytokeratin 7 (diffuse, strong), cytokeratin 20 (patchy, moderate) and negative for S100, SOX-10, and HepPar-1.These findings are consistent with carcinoma. The morphologic findings and pattern of immunohistochemical staining are non-specific and possible sites of origin include but are not limited to pancreaticobiliary tract, GI tract, prostate, kidney, lung, and breast. Per [facility], the patient had a PET scan performed in July 2023 which demonstrated a single hepatic  hypermetabolic lesion without other sites of disease.    01/19/2022 Surgery   Liver lesion resection at [facility] by Dr.Zani  A.  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  Tumor extends to hepatic parenchymal margin, but all other margins are  negative for tumor. See synoptic report and comment.   Background liver with: Mild steatosis (20%).  No definitive evidence of steatohepatitis.   Mild periportal fibrosis (trichrome and reticulin). No stainable iron (Prussian blue stain).  No evidence to support alpha-1-antitrypsin deficiency on PAS-D stain.    pT1a pNx   01/25/2022 Cancer Staging   Staging form: Intrahepatic Bile Duct, AJCC 8th Edition - Pathologic stage from 01/25/2022: Stage Unknown (pT1a, pNX, cM0) - Signed by Rickard Patience, MD on 02/21/2022 Stage prefix: Initial diagnosis   03/23/2022 - 05/01/2022 Radiation Therapy   Concurrent Xeloda 825mg /m2 BID + Radiation.    05/08/2022 -  Chemotherapy   Xeloda [1000 mg/m2 BID for 14 of every 21 days], plan 4 months.   05/08/2022, cycle 1 Xeloda.  Xeloda was stopped on 05/19/22 due to developing rash on face as well as dorsum of hands. Treated with steroid,symptoms resolved.  3/4-3/13/2024  Xeloda was stopped early due to groin cellulitis, he also developed similar skin rash again  Plan 3/25 cycle 3 dose reduce Xeloda 825 mg/m2   2000mg  BID x 14 days.   05/09/2022 Imaging   CT chest abdomen pelvis with contrast at [facility] showed Status post partial left hepatectomy without CT evidence of recurrent or metastatic disease in the chest, abdomen, pelvis.     07/24/2022 - 07/26/2022 Hospital Admission   Patient was hospitalized due to symptomatic anemia, status post 1 unit of PRBC transfusion. Also neutropenia secondary to chemotherapy.  Nadir was 0.9.  Patient was afebrile.   09/05/2022 Imaging   CT chest abdomen pelvis with contrast at [facility]  Postsurgical changes of left hepatectomy without evidence of recurrent or  metastatic disease.    01/02/2023 Imaging   CT chest abdomen pelvis w contrast [facility] No evidence of recurrent or metastatic disease.     04/18/2021, patient establish care with neurology Dr. Barbaraann Cao for intermittent altered cognition.  he was recommended to start Vimpat.    INTERVAL HISTORY Kentavius Dettore is a 63 y.o. male who has above history reviewed by me today presents for follow up visit for management of inguinal nodal recurrence of melanoma, resected T1a cholangiocarcinoma. He reports feeling well,  No abdominal pain, weight loss, nausea, emesis, or jaundice.  No new  complaints. Patient has had CT scanning and a blood work done  at [facility].  :Review of Systems  Constitutional:  Negative for appetite change, chills, fatigue, fever and unexpected weight change.  HENT:   Negative for hearing loss and voice change.   Eyes:  Negative for eye problems and icterus.  Respiratory:  Negative for chest tightness, cough and shortness of breath.   Cardiovascular:  Positive for leg swelling. Negative for chest pain.  Gastrointestinal:  Negative for abdominal distention and abdominal pain.  Endocrine: Negative for hot flashes.  Genitourinary:  Negative for difficulty urinating, dysuria and frequency.   Musculoskeletal:        Status post left hip replacement  Skin:  Negative for itching.       Skin hypo-pigmentation on upper extremities, no change   Neurological:  Negative for light-headedness and numbness.  Hematological:  Negative for adenopathy. Does not bruise/bleed easily.  Psychiatric/Behavioral:  Negative for confusion.        Grieving.     MEDICAL HISTORY:  Past Medical History:  Diagnosis Date   AKI (acute kidney injury) (HCC) 08/24/2018   Anemia    iron treatments   Anxiety    Aortic atherosclerosis (HCC)    Arthritis  Cancer of groin (HCC) 2021   left groin, resected, radiation   Cataract    Complication of anesthesia    PONV   Coronary artery disease    Dizziness 12/22/2019   Dizziness of unknown etiology    has led to seizures and passing out.   Elevated serum creatinine 04/15/2021   Exposure to combat 04/20/2020   Exposure to potentially hazardous substance 09/18/2022   Sep 11, 2022 Entered By: Derinda Late Comment: Entered automatically through [facility] Problem List documentation program     Family history of adverse reaction to anesthesia    PONV mother   GERD (gastroesophageal reflux disease)    H/O Malignant melanoma 09/18/2022   History of complete heart block    PPM placed   Hyperlipidemia    Hypertension    LBBB (left bundle  branch block)    Lymphedema of left leg    uses thigh high compression stockings   Melanoma (HCC) 2012   skin cancer, left thigh   OSA on CPAP    PONV (postoperative nausea and vomiting) 04/16/2019   Port-A-Cath in place    RIGHT chest wall   Presence of cardiac pacemaker    [employer]   Problem related to unspecified psychosocial circumstances 09/18/2022   Seizures (HCC)    still has episodes of dizziness. last event 1 month ago (march 2022) and will pass out. takes clonazepam    SURGICAL HISTORY: Past Surgical History:  Procedure Laterality Date   CORONARY ARTERY BYPASS GRAFT N/A 08/10/2021   Procedure: CORONARY ARTERY BYPASS GRAFTING (CABG) X 3 USING LEFT INTERNAL MAMMARY ARTERY AND RIGHT GREATER SAPHENOUS VEIN;  Surgeon: Lovett Sox, MD;  Location: [facility] OR;  Service: Open Heart Surgery;  Laterality: N/A;   CT RADIATION THERAPY GUIDE     left groin   dental implant     permanent implant   ENDOVEIN HARVEST OF GREATER SAPHENOUS VEIN Right 08/10/2021   Procedure: ENDOVEIN HARVEST OF GREATER SAPHENOUS VEIN;  Surgeon: Lovett Sox, MD;  Location: [facility] OR;  Service: Open Heart Surgery;  Laterality: Right;   KNEE SURGERY Left    arthroscopy   LEFT HEART CATH AND CORONARY ANGIOGRAPHY Left 06/29/2017   Procedure: LEFT HEART CATH AND CORONARY ANGIOGRAPHY;  Surgeon: Lamar Blinks, MD;  Location: [facility] INVASIVE CV LAB;  Service: Cardiovascular;  Laterality: Left;   LEFT HEART CATH AND CORONARY ANGIOGRAPHY N/A 08/08/2021   Procedure: LEFT HEART CATH AND CORONARY ANGIOGRAPHY;  Surgeon: Lamar Blinks, MD;  Location: [facility] INVASIVE CV LAB;  Service: Cardiovascular;  Laterality: N/A;   LYMPH NODE DISSECTION Left 04/16/2019   Procedure: Left inguinal Lymph Node Dissection;  Surgeon: Almond Lint, MD;  Location: [facility] OR;  Service: General;  Laterality: Left;   MELANOMA EXCISION Left 04/16/2019   Procedure: MELANOMA EXCISION LEFT GROIN MASS;  Surgeon: Almond Lint, MD;  Location: [facility] OR;  Service:  General;  Laterality: Left;   MELANOMA EXCISION WITH SENTINEL LYMPH NODE BIOPSY Left 2012   Left calf    PACEMAKER INSERTION N/A 08/26/2018   Procedure: INSERTION PACEMAKER;  Surgeon: Marcina Millard, MD;  Location: [facility] ORS;  Service: Cardiovascular;  Laterality: N/A;   PORTA CATH INSERTION N/A 08/26/2019   Procedure: PORTA CATH INSERTION;  Surgeon: Renford Dills, MD;  Location: [facility] INVASIVE CV LAB;  Service: Cardiovascular;  Laterality: N/A;   SUPERFICIAL LYMPH NODE BIOPSY / EXCISION Left 2020   lymph nodes removed around left groin melanoma site   TEE WITHOUT CARDIOVERSION N/A 08/10/2021   Procedure:  TRANSESOPHAGEAL ECHOCARDIOGRAM (TEE);  Surgeon: Lovett Sox, MD;  Location: [facility] OR;  Service: Open Heart Surgery;  Laterality: N/A;   TEMPORARY PACEMAKER N/A 08/25/2018   Procedure: TEMPORARY PACEMAKER;  Surgeon: Tonny Bollman, MD;  Location: [facility] INVASIVE CV LAB;  Service: Cardiovascular;  Laterality: N/A;   TOTAL HIP ARTHROPLASTY Left 07/14/2020   Procedure: TOTAL HIP ARTHROPLASTY;  Surgeon: Donato Heinz, MD;  Location: [facility] ORS;  Service: Orthopedics;  Laterality: Left;    SOCIAL HISTORY: Social History   Socioeconomic History   Marital status: Married    Spouse name: [name]    Number of children: 7   Years of education: 12   Highest education level: Not on file  Occupational History    Comment: disability  Tobacco Use   Smoking status: Never   Smokeless tobacco: Never  Vaping Use   Vaping status: Never Used  Substance and Sexual Activity   Alcohol use: No   Drug use: No   Sexual activity: Not Currently  Other Topics Concern   Not on file  Social History Narrative   Lives with  Wife,   Has 2 small dogs   Caffeine use: sodas (2 per day)      Out of work on disability.  Has a walk in shower. No stairs to climb   Oncology treatment ongoing. Uses port a cath for treatment.      pacemaker   Social Drivers of Health   Financial Resource Strain: Low Risk   (02/12/2019)   Overall Financial Resource Strain (CARDIA)    Difficulty of Paying Living Expenses: Not hard at all  Food Insecurity: No Food Insecurity (03/15/2023)   Hunger Vital Sign    Worried About Running Out of Food in the Last Year: Never true    Ran Out of Food in the Last Year: Never true  Transportation Needs: No Transportation Needs (03/15/2023)   PRAPARE - Administrator, Civil Service (Medical): No    Lack of Transportation (Non-Medical): No  Physical Activity: Inactive (03/15/2023)   Exercise Vital Sign    Days of Exercise per Week: 0 days    Minutes of Exercise per Session: 0 min  Stress: No Stress Concern Present (03/15/2023)   [employer] of Occupational Health - Occupational Stress Questionnaire    Feeling of Stress : Not at all  Social Connections: Unknown (02/12/2019)   Social Connection and Isolation Panel [NHANES]    Frequency of Communication with Friends and Family: More than three times a week    Frequency of Social Gatherings with Friends and Family: Not on file    Attends Religious Services: Not on file    Active Member of Clubs or Organizations: Not on file    Attends Banker Meetings: Not on file    Marital Status: Married  Intimate Partner Violence: Not At Risk (03/15/2023)   Humiliation, Afraid, Rape, and Kick questionnaire    Fear of Current or Ex-Partner: No    Emotionally Abused: No    Physically Abused: No    Sexually Abused: No    FAMILY HISTORY: Family History  Problem Relation Age of Onset   Cancer Paternal Grandmother     ALLERGIES:  is allergic to ibuprofen, levetiracetam, nsaids, and capecitabine.  MEDICATIONS:  Current Outpatient Medications  Medication Sig Dispense Refill   ALPRAZolam (XANAX) 0.5 MG tablet Take 1 tablet (0.5 mg total) by mouth 2 (two) times daily as needed for anxiety. (Patient not taking: Reported on 05/02/2023) 30 tablet  0   apixaban (ELIQUIS) 5 MG TABS tablet Take 5 mg by mouth 2  (two) times daily.     atorvastatin (LIPITOR) 20 MG tablet Take 20 mg by mouth daily.     Carboxymeth-Glyc-Polysorb PF (REFRESH OPTIVE MEGA-3) 0.5-1-0.5 % SOLN Place 1 drop into both eyes daily as needed (dry eyes).     clonazePAM (KLONOPIN) 0.5 MG tablet TAKE 1 TABLET BY MOUTH IN THE MORNING AND 1 BY MOUTH IN THE EVENING PER PT 120 tablet 1   clotrimazole (CLOTRIMAZOLE ANTI-FUNGAL) 1 % cream Apply 1 Application topically 2 (two) times daily. Apply to the L inguinal fold 30 g 0   febuxostat (ULORIC) 40 MG tablet Take 40 mg by mouth daily.     hydrocortisone 2.5 % ointment Apply 1 application. topically daily as needed (itching).     Lacosamide 100 MG TABS TAKE 1 TABLET (100 MG TOTAL) BY MOUTH IN THE MORNING AND AT BEDTIME. 60 tablet 2   lidocaine-prilocaine (EMLA) cream Apply 1 Application topically as needed. Apply small amount of cream to port site approx 1-2 hours prior to appointment. 30 g 11   lisinopril (ZESTRIL) 10 MG tablet Take 10 mg by mouth daily.     metoprolol succinate (TOPROL-XL) 25 MG 24 hr tablet Take 25 mg by mouth daily.     Multiple Vitamin (MULTIVITAMIN WITH MINERALS) TABS tablet Take 1 tablet by mouth daily. Centrum Silver     nystatin (MYCOSTATIN/NYSTOP) powder Apply 1 Application topically 3 (three) times daily. 60 g 1   omeprazole (PRILOSEC) 20 MG capsule TAKE 1 CAPSULE BY MOUTH EVERY DAY 90 capsule 0   ondansetron (ZOFRAN) 4 MG tablet TAKE 1 TABLET BY MOUTH EVERY 8 HOURS AS NEEDED FOR NAUSEA AND VOMITING 90 tablet 1   traZODone (DESYREL) 50 MG tablet Take 50 mg by mouth at bedtime as needed.     No current facility-administered medications for this visit.   Facility-Administered Medications Ordered in Other Visits  Medication Dose Route Frequency Provider Last Rate Last Admin   heparin lock flush 100 UNIT/ML injection            heparin lock flush 100 UNIT/ML injection            heparin lock flush 100 unit/mL  500 Units Intravenous Once Rickard Patience, MD       sodium  chloride flush (NS) 0.9 % injection 10 mL  10 mL Intravenous PRN Rickard Patience, MD         PHYSICAL EXAMINATION: ECOG PERFORMANCE STATUS: 1 - Symptomatic but completely ambulatory There were no vitals filed for this visit.  There were no vitals filed for this visit.   Physical Exam Constitutional:      General: He is not in acute distress.    Appearance: He is obese.     Comments: Patient ambulates independently  HENT:     Head: Normocephalic and atraumatic.  Eyes:     General: No scleral icterus.    Pupils: Pupils are equal, round, and reactive to light.  Cardiovascular:     Rate and Rhythm: Normal rate and regular rhythm.  Pulmonary:     Effort: Pulmonary effort is normal. No respiratory distress.     Breath sounds: No wheezing.  Abdominal:     General: Bowel sounds are normal. There is no distension.     Palpations: Abdomen is soft. There is no mass.     Tenderness: There is no abdominal tenderness.     Comments:  Musculoskeletal:        General: No deformity. Normal range of motion.     Cervical back: Normal range of motion and neck supple.     Comments: Left lower extremity edema-chronic   Skin:    General: Skin is warm and dry.  Neurological:     Mental Status: He is alert and oriented to person, place, and time. Mental status is at baseline.     Cranial Nerves: No cranial nerve deficit.     Coordination: Coordination normal.  Psychiatric:        Mood and Affect: Mood normal.     LABORATORY DATA:  I have reviewed the data as listed    Latest Ref Rng & Units 03/15/2023   10:55 AM 11/08/2022    8:48 AM 11/01/2022    8:47 AM  CBC  WBC 3.4 - 10.8 x10E3/uL 7.8  1.8  4.3   Hemoglobin 13.0 - 17.7 g/dL 16.1  09.6  04.5   Hematocrit 37.5 - 51.0 % 38.9  30.2  33.6   Platelets 150 - 450 x10E3/uL 194  206  204       Latest Ref Rng & Units 03/15/2023   10:55 AM 11/08/2022    8:48 AM 11/01/2022    8:47 AM  CMP  Glucose 70 - 99 mg/dL 409  811  914   BUN 8 - 27 mg/dL  21  20  27    Creatinine 0.76 - 1.27 mg/dL 7.82  9.56  2.13   Sodium 134 - 144 mmol/L 141  136  136   Potassium 3.5 - 5.2 mmol/L 5.0  3.9  4.0   Chloride 96 - 106 mmol/L 106  109  108   CO2 20 - 29 mmol/L 21  21  22    Calcium 8.6 - 10.2 mg/dL 08.6  9.0  8.9   Total Protein 6.0 - 8.5 g/dL 7.3  6.8  6.9   Total Bilirubin 0.0 - 1.2 mg/dL 0.4  0.4  0.6   Alkaline Phos 44 - 121 IU/L 98  80  81   AST 0 - 40 IU/L 42  49  36   ALT 0 - 44 IU/L 63  81  57      RADIOGRAPHIC STUDIES: I have personally reviewed the radiological images as listed and agreed with the findings in the report. No results found.

## 2023-05-09 NOTE — Assessment & Plan Note (Addendum)
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  S/p chemotherapy Xeloda 825mg /m2 BID+ concurrent RT Previously on adjuvant Xeloda [1000 mg/m2 BID for 14 of every 21 days] -dose reduced to 825mg /m2 BID due to skin rash, still not able to tolerate- discontinue.--> s/p 6 cycles gemcitabine weekly, 2 weeks on, 1 week off, [adjustment due to cytopenia] He is doing very well  Labs are reviewed and discussed with patient.  05/01/23  surveillance CT scan  at [facility]- no recurrence. Tumor markers stable.  Continue surveillance.

## 2023-05-09 NOTE — Assessment & Plan Note (Signed)
Port flush every 6 to 8 weeks.

## 2023-05-09 NOTE — Assessment & Plan Note (Addendum)
anemia due to CKD Stable hemoglobin

## 2023-05-10 ENCOUNTER — Other Ambulatory Visit: Payer: Self-pay

## 2023-05-15 ENCOUNTER — Encounter: Payer: Self-pay | Admitting: Cardiology

## 2023-05-15 ENCOUNTER — Ambulatory Visit: Payer: 59 | Admitting: Anesthesiology

## 2023-05-15 ENCOUNTER — Ambulatory Visit
Admission: RE | Admit: 2023-05-15 | Discharge: 2023-05-15 | Disposition: A | Discharge status: Home / Self Care | Payer: 59 | Attending: Cardiology | Admitting: Cardiology

## 2023-05-15 ENCOUNTER — Encounter
Admission: RE | Disposition: A | Discharge status: Home / Self Care | Payer: Self-pay | Source: Home / Self Care | Attending: Cardiology

## 2023-05-15 DIAGNOSIS — Z7901 Long term (current) use of anticoagulants: Secondary | ICD-10-CM | POA: Diagnosis not present

## 2023-05-15 DIAGNOSIS — Z951 Presence of aortocoronary bypass graft: Secondary | ICD-10-CM | POA: Insufficient documentation

## 2023-05-15 DIAGNOSIS — I129 Hypertensive chronic kidney disease with stage 1 through stage 4 chronic kidney disease, or unspecified chronic kidney disease: Secondary | ICD-10-CM | POA: Insufficient documentation

## 2023-05-15 DIAGNOSIS — Z6839 Body mass index (BMI) 39.0-39.9, adult: Secondary | ICD-10-CM | POA: Insufficient documentation

## 2023-05-15 DIAGNOSIS — I480 Paroxysmal atrial fibrillation: Secondary | ICD-10-CM | POA: Diagnosis not present

## 2023-05-15 DIAGNOSIS — I442 Atrioventricular block, complete: Secondary | ICD-10-CM | POA: Insufficient documentation

## 2023-05-15 DIAGNOSIS — I10 Essential (primary) hypertension: Secondary | ICD-10-CM | POA: Insufficient documentation

## 2023-05-15 DIAGNOSIS — Z79899 Other long term (current) drug therapy: Secondary | ICD-10-CM | POA: Insufficient documentation

## 2023-05-15 DIAGNOSIS — R569 Unspecified convulsions: Secondary | ICD-10-CM | POA: Insufficient documentation

## 2023-05-15 DIAGNOSIS — D631 Anemia in chronic kidney disease: Secondary | ICD-10-CM | POA: Diagnosis not present

## 2023-05-15 DIAGNOSIS — I4891 Unspecified atrial fibrillation: Secondary | ICD-10-CM | POA: Diagnosis not present

## 2023-05-15 DIAGNOSIS — I2510 Atherosclerotic heart disease of native coronary artery without angina pectoris: Secondary | ICD-10-CM | POA: Insufficient documentation

## 2023-05-15 DIAGNOSIS — E66813 Obesity, class 3: Secondary | ICD-10-CM | POA: Diagnosis not present

## 2023-05-15 DIAGNOSIS — K219 Gastro-esophageal reflux disease without esophagitis: Secondary | ICD-10-CM | POA: Diagnosis not present

## 2023-05-15 DIAGNOSIS — G4730 Sleep apnea, unspecified: Secondary | ICD-10-CM | POA: Insufficient documentation

## 2023-05-15 DIAGNOSIS — N1831 Chronic kidney disease, stage 3a: Secondary | ICD-10-CM | POA: Diagnosis not present

## 2023-05-15 DIAGNOSIS — I4892 Unspecified atrial flutter: Secondary | ICD-10-CM | POA: Diagnosis not present

## 2023-05-15 DIAGNOSIS — N183 Chronic kidney disease, stage 3 unspecified: Secondary | ICD-10-CM | POA: Insufficient documentation

## 2023-05-15 DIAGNOSIS — Z950 Presence of cardiac pacemaker: Secondary | ICD-10-CM | POA: Insufficient documentation

## 2023-05-15 DIAGNOSIS — F419 Anxiety disorder, unspecified: Secondary | ICD-10-CM | POA: Insufficient documentation

## 2023-05-15 DIAGNOSIS — I4819 Other persistent atrial fibrillation: Secondary | ICD-10-CM

## 2023-05-15 HISTORY — PX: CARDIOVERSION: SHX1299

## 2023-05-15 SURGERY — CARDIOVERSION
Anesthesia: General

## 2023-05-15 MED ORDER — SODIUM CHLORIDE 0.9 % IV SOLN: INTRAVENOUS | Status: DC

## 2023-05-15 MED ORDER — PROPOFOL 10 MG/ML IV BOLUS
INTRAVENOUS | Status: AC
  Filled 2023-05-15: qty 20

## 2023-05-15 MED ORDER — PROPOFOL 10 MG/ML IV BOLUS
INTRAVENOUS | Status: DC | PRN
  Administered 2023-05-15: 80 mg via INTRAVENOUS

## 2023-05-15 NOTE — Op Note (Signed)
[facility]   05/15/2023                     7:39 AM  PATIENT:  Edward Pitts    PRE-OPERATIVE DIAGNOSIS:  Cardioversion   Afib  POST-OPERATIVE DIAGNOSIS:  Same  PROCEDURE:  CARDIOVERSION  SURGEON:  Marcina Millard, MD    ANESTHESIA:     PREOPERATIVE INDICATIONS:  [name] is a  63 y.o. male with a diagnosis of Cardioversion   Afib who failed conservative measures and elected for surgical management.    The risks benefits and alternatives were discussed with the patient preoperatively including but not limited to the risks of infection, bleeding, cardiopulmonary complications, the need for revision surgery, among others, and the patient was willing to proceed.   OPERATIVE PROCEDURE: The patient presented to special procedures in a fasting state.  ECG revealed atrial flutter with ventricular pacing.  Patient received 80 mg per propofol.  Direct-current cardioversion was performed with 75 and 120 J with successful conversion to atrial sensing with ventricular pacing.  There were no periprocedural complications.

## 2023-05-15 NOTE — Anesthesia Postprocedure Evaluation (Signed)
Anesthesia Post Note  Patient: Edward Pitts  Procedure(s) Performed: CARDIOVERSION  Patient location during evaluation: PACU Anesthesia Type: General Level of consciousness: awake and alert, oriented and patient cooperative Pain management: pain level controlled Vital Signs Assessment: post-procedure vital signs reviewed and stable Respiratory status: spontaneous breathing, nonlabored ventilation and respiratory function stable Cardiovascular status: blood pressure returned to baseline and stable Postop Assessment: adequate PO intake Anesthetic complications: no   No notable events documented.   Last Vitals:  Vitals:   05/15/23 0745 05/15/23 0800  BP: 138/63 (!) 141/59  Pulse: 61 60  Resp: (!) 21 18  Temp:    SpO2: 96% 96%    Last Pain:  Vitals:   05/15/23 0800  TempSrc:   PainSc: 0-No pain                 Reed Breech

## 2023-05-15 NOTE — Transfer of Care (Signed)
Immediate Anesthesia Transfer of Care Note  Patient: Edward Pitts  Procedure(s) Performed: Procedure(s): CARDIOVERSION (N/A)  Patient Location: PACU and Short Stay  Anesthesia Type:General  Level of Consciousness: awake, alert  and oriented  Airway & Oxygen Therapy: Patient Spontanous Breathing and Patient connected to nasal cannula oxygen  Post-op Assessment: Report given to RN and Post -op Vital signs reviewed and stable  Post vital signs: Reviewed and stable  Last Vitals:  Vitals:   05/15/23 0737 05/15/23 0738  BP:  (!) 140/55  Pulse: 61 60  Resp: 16 12  Temp:    SpO2: 97% 98%    Complications: No apparent anesthesia complicationsImmediate Anesthesia Transfer of Care Note  Patient: Edward Pitts  Procedure(s) Performed: Procedure(s): CARDIOVERSION (N/A)  Patient Location: PACU and Short Stay  Anesthesia Type:General  Level of Consciousness: awake, alert  and oriented  Airway & Oxygen Therapy: Patient Spontanous Breathing and Patient connected to nasal cannula oxygen  Post-op Assessment: Report given to RN and Post -op Vital signs reviewed and stable  Post vital signs: Reviewed and stable  Last Vitals:  Vitals:   05/15/23 0737 05/15/23 0738  BP:  (!) 140/55  Pulse: 61 60  Resp: 16 12  Temp:    SpO2: 97% 98%    Complications: No apparent anesthesia complicationsImmediate Anesthesia Transfer of Care Note  Patient: Edward Pitts  Procedure(s) Performed: Procedure(s): CARDIOVERSION (N/A)  Patient Location: PACU and Short Stay  Anesthesia Type:General  Level of Consciousness: awake, alert  and oriented  Airway & Oxygen Therapy: Patient Spontanous Breathing and Patient connected to nasal cannula oxygen  Post-op Assessment: Report given to RN and Post -op Vital signs reviewed and stable  Post vital signs: Reviewed and stable  Last Vitals:  Vitals:   05/15/23 0737 05/15/23 0738  BP:  (!) 140/55  Pulse: 61 60  Resp: 16 12  Temp:    SpO2: 97%  98%    Complications: No apparent anesthesia complications

## 2023-05-15 NOTE — Anesthesia Procedure Notes (Signed)
Date/Time: 05/15/2023 7:38 AM  Performed by: Stormy Fabian, CRNAPre-anesthesia Checklist: Patient identified, Emergency Drugs available, Suction available and Patient being monitored Patient Re-evaluated:Patient Re-evaluated prior to induction Oxygen Delivery Method: Nasal cannula Induction Type: IV induction Dental Injury: Teeth and Oropharynx as per pre-operative assessment  Comments: Nasal cannula with etCO2 monitoring

## 2023-05-15 NOTE — Anesthesia Preprocedure Evaluation (Addendum)
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)  + dysrhythmias (a fib on Eliquis) + pacemaker (complete heart block)  Rhythm:Irregular Rate:Normal     Neuro/Psych Seizures - (last sz 1 week ago),  PSYCHIATRIC DISORDERS Anxiety        GI/Hepatic ,GERD  ,,  Endo/Other    Class 3 obesity  Renal/GU Renal disease (stage III CKD)     Musculoskeletal  (+) Arthritis ,    Abdominal   Peds  Hematology  (+) Blood dyscrasia, anemia   Anesthesia Other Findings   Reproductive/Obstetrics                             Anesthesia Physical Anesthesia Plan  ASA: 3  Anesthesia Plan: General   Post-op Pain Management:    Induction: Intravenous  PONV Risk Score and Plan: 3 and Propofol infusion, TIVA and Treatment may vary due to age or medical condition  Airway Management Planned: Natural Airway  Additional Equipment:   Intra-op Plan:   Post-operative Plan:   Informed Consent: I have reviewed the patients History and Physical, chart, labs and discussed the procedure including the risks, benefits and alternatives for the proposed anesthesia with the patient or authorized representative who has indicated his/her understanding and acceptance.       Plan Discussed with: CRNA  Anesthesia Plan Comments: (LMA/GETA backup discussed.  Patient consented for risks of anesthesia including but not limited to:  -  adverse reactions to medications - damage to eyes, teeth, lips or other oral mucosa - nerve damage due to positioning  - sore throat or hoarseness - damage to heart, brain, nerves, lungs, other parts of body or loss of life  Informed patient about role of CRNA in peri- and intra-operative care.  Patient voiced understanding.)       Anesthesia Quick Evaluation

## 2023-05-16 ENCOUNTER — Encounter: Payer: Self-pay | Admitting: Cardiology

## 2023-05-22 DIAGNOSIS — Z950 Presence of cardiac pacemaker: Secondary | ICD-10-CM | POA: Diagnosis not present

## 2023-05-22 DIAGNOSIS — I447 Left bundle-branch block, unspecified: Secondary | ICD-10-CM | POA: Diagnosis not present

## 2023-05-22 DIAGNOSIS — I214 Non-ST elevation (NSTEMI) myocardial infarction: Secondary | ICD-10-CM | POA: Diagnosis not present

## 2023-05-22 DIAGNOSIS — I2510 Atherosclerotic heart disease of native coronary artery without angina pectoris: Secondary | ICD-10-CM | POA: Diagnosis not present

## 2023-05-22 DIAGNOSIS — N1831 Chronic kidney disease, stage 3a: Secondary | ICD-10-CM | POA: Diagnosis not present

## 2023-05-22 DIAGNOSIS — I480 Paroxysmal atrial fibrillation: Secondary | ICD-10-CM | POA: Diagnosis not present

## 2023-05-22 DIAGNOSIS — E782 Mixed hyperlipidemia: Secondary | ICD-10-CM | POA: Diagnosis not present

## 2023-05-22 DIAGNOSIS — I10 Essential (primary) hypertension: Secondary | ICD-10-CM | POA: Diagnosis not present

## 2023-05-22 DIAGNOSIS — Z951 Presence of aortocoronary bypass graft: Secondary | ICD-10-CM | POA: Diagnosis not present

## 2023-05-22 DIAGNOSIS — I442 Atrioventricular block, complete: Secondary | ICD-10-CM | POA: Diagnosis not present

## 2023-05-22 DIAGNOSIS — R002 Palpitations: Secondary | ICD-10-CM | POA: Diagnosis not present

## 2023-05-23 ENCOUNTER — Other Ambulatory Visit: Payer: Self-pay | Admitting: Oncology

## 2023-05-24 ENCOUNTER — Encounter: Payer: Self-pay | Admitting: Oncology

## 2023-05-30 ENCOUNTER — Encounter: Payer: Self-pay | Admitting: Oncology

## 2023-05-30 DIAGNOSIS — Z950 Presence of cardiac pacemaker: Secondary | ICD-10-CM | POA: Diagnosis not present

## 2023-05-30 DIAGNOSIS — I483 Typical atrial flutter: Secondary | ICD-10-CM | POA: Diagnosis not present

## 2023-06-04 ENCOUNTER — Other Ambulatory Visit: Payer: Self-pay | Admitting: Internal Medicine

## 2023-06-08 ENCOUNTER — Inpatient Hospital Stay: Payer: 59 | Attending: Oncology | Admitting: Internal Medicine

## 2023-06-08 ENCOUNTER — Encounter: Payer: Self-pay | Admitting: Internal Medicine

## 2023-06-08 VITALS — BP 132/59 | HR 68 | Resp 16 | Wt 254.9 lb

## 2023-06-08 DIAGNOSIS — G40909 Epilepsy, unspecified, not intractable, without status epilepticus: Secondary | ICD-10-CM | POA: Diagnosis present

## 2023-06-08 DIAGNOSIS — Z9221 Personal history of antineoplastic chemotherapy: Secondary | ICD-10-CM | POA: Insufficient documentation

## 2023-06-08 DIAGNOSIS — Z923 Personal history of irradiation: Secondary | ICD-10-CM | POA: Insufficient documentation

## 2023-06-08 DIAGNOSIS — Z85820 Personal history of malignant melanoma of skin: Secondary | ICD-10-CM | POA: Insufficient documentation

## 2023-06-08 DIAGNOSIS — R569 Unspecified convulsions: Secondary | ICD-10-CM

## 2023-06-08 DIAGNOSIS — Z8505 Personal history of malignant neoplasm of liver: Secondary | ICD-10-CM | POA: Diagnosis not present

## 2023-06-08 MED ORDER — LACOSAMIDE 150 MG PO TABS: 150.0000 mg | ORAL_TABLET | Freq: Two times a day (BID) | ORAL | 2 refills | Status: DC

## 2023-06-08 NOTE — Progress Notes (Signed)
 [facility] [address]  [place], [place] [address] [phone]   Interval Evaluation  Date of Service: 06/08/23 Patient Name: Edward Pitts Patient MRN: [id number] Patient DOB: 07-Nov-1960 Provider: Henreitta Leber, MD  Identifying Statement:  Edward Pitts is a 63 y.o. male with suspected seizures  Primary Cancer:  Oncologic History: Oncology History  Malignant melanoma of overlapping sites [facility])  04/16/2019 Cancer Staging   Staging form: Melanoma of the Skin, AJCC 8th Edition - Pathologic: Stage Unknown (rpTX, pN1b, cM0) - Signed by Rickard Patience, MD on 07/27/2020 Stage prefix: Recurrence    04/23/2019 Initial Diagnosis   Malignant melanoma   -He has a history of left lower extremity melanoma in 2011, status post local excision -04/16/2019 patient underwent left groin mass resection  Resection pathology showed malignant melanoma, replacing a lymph node, with extracapsular extension, peripheral and deep margins involved.  Left inguinal contents, all 7 lymph nodes were negative for melanoma in the lymph nodes. Extranodal melanoma identified in lymphatic and interstitium between nodes -PDL1 80% TPS    07/07/2019 -  Radiation Therapy   status post adjuvant radiation.   07/23/2019 - 07/21/2021 Chemotherapy   Nivolumab q14d      06/29/2020 Imaging   CT chest abdomen pelvis showed stable postoperative appearance of the left groin.  No evidence of local recurrence.  No evidence of metastatic disease in the chest abdomen or pelvis.  Hepatic steatosis.  Stable subcentimeter fluid attenuation lesion of the lateral right lobe of the liver, likely benign cyst or hemangioma.  Coronary artery disease.  Aortic atherosclerosis   10/20/2020 Imaging   CT chest abdomen pelvis showed stable postoperative/radiation appearance of the left groin.  No evidence of local recurrence/metastatic disease within the chest abdomen/pelvis.  Fatty liver disease.  Diverticulosis  without evidence of typhlitis.  Aortic atherosclerosis   03/10/2021 Imaging   MRI brain without contrast showed no definitive evidence of intracranial metastatic disease.  Study is limited by absence of intravenous contrast.     04/26/2021 Imaging   CT chest abdomen pelvis without contrast showed stable post operative changes of left groin with no evidence of recurrent disease.  No evidence of metastatic disease in the chest abdomen pelvis.  Aortic atherosclerosis   08/08/2021 - 08/16/2021 Hospital Admission    patient was hospitalized due to NSTEMI status post CABG x3.  He also had pacemaker The echocardiogram showed left ventricular ejection fraction of 65 to 70%,   09/15/2021 Imaging   CT chest abdomen pelvis w contrast  IMPRESSION: 1. Subtle hypodense 9 mm lesion in the left lobe of the liver is new from prior imaging including previous contrasted CT dating back to December 10, 2019, with the lesion appearing to equilibrate with background liver on delayed imaging sequence but is incompletely evaluated on this imaging study and technically nonspecific possibly reflecting a benign perfusional variant and while its appearance is not typical for that of a melanoma metastasis, it is not excluded on this examination. Suggest more definitive characterization by hepatic protocol MRI with and without contrast. 2. Stable postoperative changes in the left groin without evidence of local recurrent disease.3. No evidence of metastatic disease in the chest or pelvis.4.  Aortic Atherosclerosis (ICD10-I70.0).      09/23/2021 Imaging   Contrast-enhanced liver ultrasound Mildly hypoenhancing 2.2 cm mass in the posterior aspect of the left lobe of the liver with washout characteristics concerning for non hepatocellular malignancy, concerning for melanotic metastasis given history. The lesion  is in an unfavorable location for percutaneous biopsy. Consider PET-CT for further characterization   10/21/2021 -   Chemotherapy   Nivolumab q14d      11/10/2021 Imaging   MRI Brain w wo contrast Negative for metastatic disease to the brain   11/10/2021 Imaging   MRI abdomen w wo contrast 1. Lesion of the posterior superior left lobe of the liver, hepatic segment II, measuring 1.9 x 1.8 cm corresponding to findings of prior imaging. Evaluation is somewhat limited breath motion artifact however there is subtle associated rim enhancement of this lesion. Findings are most in keeping with a hepatic metastasis in the setting of known recurrent melanoma. 2. Mild hepatic steatosis. 3. Cardiomegaly.    02/27/2022 - 02/27/2022 Chemotherapy   Patient is on Treatment Plan : Capecitabine (825 mg/m2 bid) + XRT     07/12/2022 -  Chemotherapy   Patient is on Treatment Plan : PANCREAS Gemcitabine D1,8(1000) q21d     Cholangiocarcinoma (HCC)  12/19/2021 Initial Diagnosis   Liver biopsy showed carcinoma  -The slides on the patient's prior left inguinal lymph node biopsy ([id number]) with metastatic melanoma were reviewed in conjunction with  this case. The morphology of the malignant cells between the two cases are dissimilar. A limited panel of immunohistochemical stains was performed and the neoplastic cells are positive for superpancytokeratin, cytokeratin 7 (diffuse, strong), cytokeratin 20 (patchy, moderate) and negative for S100, SOX-10, and HepPar-1.These findings are consistent with carcinoma. The morphologic findings and pattern of immunohistochemical staining are non-specific and possible sites of origin include but are not limited to pancreaticobiliary tract, GI tract, prostate, kidney, lung, and breast. Per [facility], the patient had a PET scan performed in July 2023 which demonstrated a single hepatic  hypermetabolic lesion without other sites of disease.    01/19/2022 Surgery   Liver lesion resection at [facility] by Dr.Zani  A.  Liver, segment 2, 3, 4A, partial hepatectomy:   Cholangiocarcinoma, moderately differentiated  (2.5 cm, segment 2).  Tumor extends to hepatic parenchymal margin, but all other margins are negative for tumor. See synoptic report and comment.   Background liver with: Mild steatosis (20%).  No definitive evidence of steatohepatitis.   Mild periportal fibrosis (trichrome and reticulin). No stainable iron (Prussian blue stain).  No evidence to support alpha-1-antitrypsin deficiency on PAS-D stain.   pT1a pNx   01/25/2022 Cancer Staging   Staging form: Intrahepatic Bile Duct, AJCC 8th Edition - Pathologic stage from 01/25/2022: Stage Unknown (pT1a, pNX, cM0) - Signed by Rickard Patience, MD on 02/21/2022 Stage prefix: Initial diagnosis   03/23/2022 - 05/01/2022 Radiation Therapy   Concurrent Xeloda 825mg /m2 BID + Radiation.    05/08/2022 -  Chemotherapy   Xeloda [1000 mg/m2 BID for 14 of every 21 days], plan 4 months.   05/08/2022, cycle 1 Xeloda.  Xeloda was stopped on 05/19/22 due to developing rash on face as well as dorsum of hands. Treated with steroid,symptoms resolved.  3/4-3/13/2024  Xeloda was stopped early due to groin cellulitis, he also developed similar skin rash again  Plan 3/25 cycle 3 dose reduce Xeloda 825 mg/m2   2000mg  BID x 14 days.   05/09/2022 Imaging   CT chest abdomen pelvis with contrast at [facility] showed Status post partial left hepatectomy without CT evidence of recurrent or metastatic disease in the chest, abdomen, pelvis.     07/24/2022 - 07/26/2022 Hospital Admission   Patient was hospitalized due to symptomatic anemia, status post 1 unit of PRBC transfusion. Also neutropenia secondary to chemotherapy.  Nadir was 0.9.  Patient was afebrile.   09/05/2022 Imaging   CT chest abdomen pelvis with contrast at [facility]  Postsurgical changes of left hepatectomy without evidence of recurrent or  metastatic disease.    01/02/2023 Imaging   CT chest abdomen pelvis w contrast [facility] No evidence of recurrent or metastatic disease.    05/01/2023 Imaging   CT chest abdomen pelvis w  contrast at [facility] showed  Impression:  1. No findings of locally recurrent or metastatic disease within the  chest, abdomen or pelvis.  2.  Diffuse hepatic steatosis.     05/01/2023 Tumor Marker   CA 19.9   8 CEA 0.3     Interval History: Edward Pitts presents for clinical follow up.  Recently was hospitalized for syncope and afib. Continues to describe sporadic seizures, described as blurry vision, head spinning, confusion.  Frequency remains about once per month.  No longer on chemotherapy with Dr. Cathie Hoops at this time.  Denies headaches.  H+P (04/15/21) Patient presents for follow up given neurologic complaints.  He has seen neurologist in [place] for several years, relevant history is obtained from review of prior records.  In short, he experiences paroxysmal episodes of "head in a vice, disconnected, in a daze, can't understand language".  Episodes started in 2019, last 1-2 minutes, and occur 2-3x per month.  He thinks frequency decreased when Clonazepam was started by Dr. Anne Hahn in 2019.  He had normal EEG at that time.  Recently was given a trial of Keppra, but he experienced significant dizziness and stopped it.  Had MRI brain without contrast done recently through Dr. Cathie Hoops.  Continues on nivolumab infusions for melanoma.   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  Medications: Current Outpatient Medications on File Prior to Visit  Medication Sig Dispense Refill   apixaban (ELIQUIS) 5 MG TABS tablet Take 5 mg by mouth 2 (two) times daily.     aspirin EC 81 MG tablet Take 81 mg by mouth daily. Swallow whole.     atorvastatin (LIPITOR) 20 MG tablet Take 20 mg by mouth daily.     Calcium-Vitamin D-Vitamin K 750-500-40 MG-UNT-MCG TABS Take 1 tablet by mouth daily.     clonazePAM (KLONOPIN) 0.5 MG tablet TAKE 1 TABLET BY MOUTH IN THE MORNING AND 1 BY MOUTH IN THE EVENING PER PT 120 tablet  1   clotrimazole (CLOTRIMAZOLE ANTI-FUNGAL) 1 % cream Apply 1 Application topically 2 (two) times daily. Apply to the L inguinal fold (Patient taking differently: Apply 1 Application topically 2 (two) times daily as needed (Apply to the L inguinal fold).) 30 g 0   febuxostat (ULORIC) 40 MG tablet Take 40 mg by mouth daily.     hydrocortisone 2.5 % ointment Apply 1 application. topically daily as needed (itching).     Lacosamide 100 MG TABS TAKE 1 TABLET (100 MG TOTAL) BY MOUTH IN THE MORNING AND AT BEDTIME. 60 tablet 2   lidocaine-prilocaine (EMLA) cream Apply 1 Application topically as needed. Apply small amount of cream to port site approx 1-2 hours prior to appointment. 30 g 11   lisinopril (ZESTRIL) 10 MG tablet Take 10 mg by mouth daily.     metoprolol succinate (TOPROL-XL) 25 MG 24 hr tablet Take 25 mg by mouth daily.     Multiple Vitamin (MULTIVITAMIN WITH MINERALS) TABS tablet Take 1 tablet by mouth daily. Centrum Silver     nystatin (  MYCOSTATIN/NYSTOP) powder Apply 1 Application topically 3 (three) times daily. (Patient taking differently: Apply 1 Application topically 3 (three) times daily as needed (irritation).) 60 g 1   omeprazole (PRILOSEC) 20 MG capsule TAKE 1 CAPSULE BY MOUTH EVERY DAY 90 capsule 0   ondansetron (ZOFRAN) 4 MG tablet TAKE 1 TABLET BY MOUTH EVERY 8 HOURS AS NEEDED FOR NAUSEA AND VOMITING 90 tablet 1   Propylene Glycol (SYSTANE BALANCE) 0.6 % SOLN Place 1 drop into both eyes as needed (dry eyes).     traZODone (DESYREL) 50 MG tablet Take 50 mg by mouth at bedtime as needed for sleep.     Current Facility-Administered Medications on File Prior to Visit  Medication Dose Route Frequency Provider Last Rate Last Admin   heparin lock flush 100 UNIT/ML injection            heparin lock flush 100 UNIT/ML injection            heparin lock flush 100 unit/mL  500 Units Intravenous Once Rickard Patience, MD       sodium chloride flush (NS) 0.9 % injection 10 mL  10 mL Intravenous PRN  Rickard Patience, MD        Allergies:  Allergies  Allergen Reactions   Ibuprofen Other (See Comments)    Affects kidneys   Keppra [Levetiracetam] Hives and Rash    Other reaction(s): Dizziness, Headache   Nsaids     Affects kidneys   Capecitabine Rash    Other Reaction(s): Eruption of skin   Past Medical History:  Past Medical History:  Diagnosis Date   AKI (acute kidney injury) (HCC) 08/24/2018   Anemia    iron treatments   Anxiety    Aortic atherosclerosis (HCC)    Arthritis    Cancer of groin (HCC) 2021   left groin, resected, radiation   Cataract    Complication of anesthesia    PONV   Coronary artery disease    Dizziness 12/22/2019   Dizziness of unknown etiology    has led to seizures and passing out.   Elevated serum creatinine 04/15/2021   Exposure to combat 04/20/2020   Exposure to potentially hazardous substance 09/18/2022   Sep 11, 2022 Entered By: Derinda Late Comment: Entered automatically through [facility] Problem List documentation program     Family history of adverse reaction to anesthesia    PONV mother   GERD (gastroesophageal reflux disease)    H/O Malignant melanoma 09/18/2022   History of complete heart block    PPM placed   Hyperlipidemia    Hypertension    LBBB (left bundle branch block)    Lymphedema of left leg    uses thigh high compression stockings   Melanoma (HCC) 2012   skin cancer, left thigh   OSA on CPAP    PONV (postoperative nausea and vomiting) 04/16/2019   Port-A-Cath in place    RIGHT chest wall   Presence of cardiac pacemaker    [employer]   Problem related to unspecified psychosocial circumstances 09/18/2022   Seizures (HCC)    still has episodes of dizziness. last event 1 month ago (march 2022) and will pass out. takes clonazepam   Past Surgical History:  Past Surgical History:  Procedure Laterality Date   CARDIOVERSION N/A 05/15/2023   Procedure: CARDIOVERSION;  Surgeon: Marcina Millard, MD;  Location: [facility] ORS;   Service: Cardiovascular;  Laterality: N/A;   CORONARY ARTERY BYPASS GRAFT N/A 08/10/2021   Procedure: CORONARY ARTERY BYPASS GRAFTING (CABG) X 3 USING  LEFT INTERNAL MAMMARY ARTERY AND RIGHT GREATER SAPHENOUS VEIN;  Surgeon: Lovett Sox, MD;  Location: [facility] OR;  Service: Open Heart Surgery;  Laterality: N/A;   CT RADIATION THERAPY GUIDE     left groin   dental implant     permanent implant   ENDOVEIN HARVEST OF GREATER SAPHENOUS VEIN Right 08/10/2021   Procedure: ENDOVEIN HARVEST OF GREATER SAPHENOUS VEIN;  Surgeon: Lovett Sox, MD;  Location: [facility] OR;  Service: Open Heart Surgery;  Laterality: Right;   KNEE SURGERY Left    arthroscopy   LEFT HEART CATH AND CORONARY ANGIOGRAPHY Left 06/29/2017   Procedure: LEFT HEART CATH AND CORONARY ANGIOGRAPHY;  Surgeon: Lamar Blinks, MD;  Location: [facility] INVASIVE CV LAB;  Service: Cardiovascular;  Laterality: Left;   LEFT HEART CATH AND CORONARY ANGIOGRAPHY N/A 08/08/2021   Procedure: LEFT HEART CATH AND CORONARY ANGIOGRAPHY;  Surgeon: Lamar Blinks, MD;  Location: [facility] INVASIVE CV LAB;  Service: Cardiovascular;  Laterality: N/A;   LYMPH NODE DISSECTION Left 04/16/2019   Procedure: Left inguinal Lymph Node Dissection;  Surgeon: Almond Lint, MD;  Location: [facility] OR;  Service: General;  Laterality: Left;   MELANOMA EXCISION Left 04/16/2019   Procedure: MELANOMA EXCISION LEFT GROIN MASS;  Surgeon: Almond Lint, MD;  Location: [facility] OR;  Service: General;  Laterality: Left;   MELANOMA EXCISION WITH SENTINEL LYMPH NODE BIOPSY Left 2012   Left calf    PACEMAKER INSERTION N/A 08/26/2018   Procedure: INSERTION PACEMAKER;  Surgeon: Marcina Millard, MD;  Location: [facility] ORS;  Service: Cardiovascular;  Laterality: N/A;   PORTA CATH INSERTION N/A 08/26/2019   Procedure: PORTA CATH INSERTION;  Surgeon: Renford Dills, MD;  Location: [facility] INVASIVE CV LAB;  Service: Cardiovascular;  Laterality: N/A;   SUPERFICIAL LYMPH NODE BIOPSY / EXCISION Left 2020   lymph  nodes removed around left groin melanoma site   TEE WITHOUT CARDIOVERSION N/A 08/10/2021   Procedure: TRANSESOPHAGEAL ECHOCARDIOGRAM (TEE);  Surgeon: Lovett Sox, MD;  Location: [facility] OR;  Service: Open Heart Surgery;  Laterality: N/A;   TEMPORARY PACEMAKER N/A 08/25/2018   Procedure: TEMPORARY PACEMAKER;  Surgeon: Tonny Bollman, MD;  Location: [facility] INVASIVE CV LAB;  Service: Cardiovascular;  Laterality: N/A;   TOTAL HIP ARTHROPLASTY Left 07/14/2020   Procedure: TOTAL HIP ARTHROPLASTY;  Surgeon: Donato Heinz, MD;  Location: [facility] ORS;  Service: Orthopedics;  Laterality: Left;   Social History:  Social History   Socioeconomic History   Marital status: Married    Spouse name: [name]    Number of children: 7   Years of education: 12   Highest education level: Not on file  Occupational History    Comment: disability  Tobacco Use   Smoking status: Never   Smokeless tobacco: Never  Vaping Use   Vaping status: Never Used  Substance and Sexual Activity   Alcohol use: No   Drug use: No   Sexual activity: Not Currently  Other Topics Concern   Not on file  Social History Narrative   Lives with  Wife,   Has 2 small dogs   Caffeine use: sodas (2 per day)      Out of work on disability.  Has a walk in shower. No stairs to climb   Oncology treatment ongoing. Uses port a cath for treatment.      pacemaker   Social Drivers of Health   Financial Resource Strain: Low Risk  (02/12/2019)   Overall Financial Resource Strain (CARDIA)    Difficulty of Paying Living  Expenses: Not hard at all  Food Insecurity: No Food Insecurity (03/15/2023)   Hunger Vital Sign    Worried About Running Out of Food in the Last Year: Never true    Ran Out of Food in the Last Year: Never true  Transportation Needs: No Transportation Needs (03/15/2023)   PRAPARE - Administrator, Civil Service (Medical): No    Lack of Transportation (Non-Medical): No  Physical Activity: Inactive (03/15/2023)    Exercise Vital Sign    Days of Exercise per Week: 0 days    Minutes of Exercise per Session: 0 min  Stress: No Stress Concern Present (03/15/2023)   [employer] of Occupational Health - Occupational Stress Questionnaire    Feeling of Stress : Not at all  Social Connections: Unknown (02/12/2019)   Social Connection and Isolation Panel [NHANES]    Frequency of Communication with Friends and Family: More than three times a week    Frequency of Social Gatherings with Friends and Family: Not on file    Attends Religious Services: Not on file    Active Member of Clubs or Organizations: Not on file    Attends Banker Meetings: Not on file    Marital Status: Married  Intimate Partner Violence: Not At Risk (03/15/2023)   Humiliation, Afraid, Rape, and Kick questionnaire    Fear of Current or Ex-Partner: No    Emotionally Abused: No    Physically Abused: No    Sexually Abused: No   Family History:  Family History  Problem Relation Age of Onset   Cancer Paternal Grandmother     Review of Systems: Constitutional: Doesn't report fevers, chills or abnormal weight loss Eyes: Doesn't report blurriness of vision Ears, nose, mouth, throat, and face: Doesn't report sore throat Respiratory: Doesn't report cough, dyspnea or wheezes Cardiovascular: Doesn't report palpitation, chest discomfort  Gastrointestinal:  Doesn't report nausea, constipation, diarrhea GU: Doesn't report incontinence Skin: Doesn't report skin rashes Neurological: Per HPI Musculoskeletal: Doesn't report joint pain Behavioral/Psych: Doesn't report anxiety  Physical Exam: Wt Readings from Last 3 Encounters:  06/08/23 254 lb 14.4 oz (115.6 kg)  05/15/23 255 lb (115.7 kg)  05/09/23 258 lb 12.8 oz (117.4 kg)   Temp Readings from Last 3 Encounters:  05/15/23 99 F (37.2 C) (Oral)  05/09/23 (!) 97.4 F (36.3 C) (Tympanic)  05/02/23 97.6 F (36.4 C) (Temporal)   BP Readings from Last 3 Encounters:   06/08/23 (!) 132/59  05/15/23 (!) 141/59  05/09/23 (!) 150/64   Pulse Readings from Last 3 Encounters:  06/08/23 68  05/15/23 60  05/09/23 79   KPS: 90. General: Alert, cooperative, pleasant, in no acute distress Head: Normal EENT: No conjunctival injection or scleral icterus.  Lungs: Resp effort normal Cardiac: Regular rate Abdomen: Non-distended abdomen Skin: erythematous rash, bilateral hands Extremities: No clubbing or edema  Neurologic Exam: Mental Status: Awake, alert, attentive to examiner. Oriented to self and environment. Language is fluent with intact comprehension.  Cranial Nerves: Visual acuity is grossly normal. Visual fields are full. Extra-ocular movements intact. No ptosis. Face is symmetric Motor: Tone and bulk are normal. Power is full in both arms and legs. Reflexes are symmetric, no pathologic reflexes present.  Sensory: Intact to light touch Gait: Normal.   Labs: I have reviewed the data as listed    Component Value Date/Time   NA 141 03/15/2023 1055   K 5.0 03/15/2023 1055   CL 106 03/15/2023 1055   CO2 21 03/15/2023 1055  GLUCOSE 102 (H) 03/15/2023 1055   GLUCOSE 166 (H) 11/08/2022 0848   BUN 21 03/15/2023 1055   CREATININE 1.21 03/15/2023 1055   CREATININE 1.06 11/08/2022 0848   CALCIUM 10.1 03/15/2023 1055   PROT 7.3 03/15/2023 1055   ALBUMIN 4.3 03/15/2023 1055   AST 42 (H) 03/15/2023 1055   AST 49 (H) 11/08/2022 0848   ALT 63 (H) 03/15/2023 1055   ALT 81 (H) 11/08/2022 0848   ALKPHOS 98 03/15/2023 1055   BILITOT 0.4 03/15/2023 1055   BILITOT 0.4 11/08/2022 0848   GFRNONAA >60 11/08/2022 0848   GFRAA >60 12/25/2019 0905   Lab Results  Component Value Date   WBC 7.8 03/15/2023   NEUTROABS 6.2 03/15/2023   HGB 12.6 (L) 03/15/2023   HCT 38.9 03/15/2023   MCV 93 03/15/2023   PLT 194 03/15/2023    Assessment/Plan Seizure disorder (HCC)  Edward Pitts is clinically stable today.  Seizures continue to persist, ~monthly.  They are  modestly disruptive to his functioning.  Recommended trial of increased Vimpat 150mg  BID.  Clonazepam 0.5/0.5 will remain at for seizure prevention.  He is agreeable with this.  Ok with PRN rescue ativan 2mg  as discussed.  We appreciate the opportunity to participate in the care of Edward Pitts.  He will follow up in clinic in 3 months or sooner if needed.  All questions were answered. The patient knows to call the clinic with any problems, questions or concerns. No barriers to learning were detected.  The total time spent in the encounter was 30 minutes and more than 50% was on counseling and review of test results   Henreitta Leber, MD Medical Director of Neuro-Oncology [facility] at [place] 06/08/23 9:03 AM

## 2023-06-14 ENCOUNTER — Ambulatory Visit: Payer: 59 | Admitting: Family

## 2023-06-19 ENCOUNTER — Encounter: Payer: Self-pay | Admitting: Oncology

## 2023-06-26 ENCOUNTER — Encounter: Payer: Self-pay | Admitting: Family

## 2023-06-26 ENCOUNTER — Ambulatory Visit (INDEPENDENT_AMBULATORY_CARE_PROVIDER_SITE_OTHER): Payer: 59 | Admitting: Family

## 2023-06-26 VITALS — BP 146/62 | HR 80 | Ht 67.0 in | Wt 256.6 lb

## 2023-06-26 DIAGNOSIS — E782 Mixed hyperlipidemia: Secondary | ICD-10-CM | POA: Diagnosis not present

## 2023-06-26 DIAGNOSIS — D61818 Other pancytopenia: Secondary | ICD-10-CM

## 2023-06-26 DIAGNOSIS — R7303 Prediabetes: Secondary | ICD-10-CM | POA: Diagnosis not present

## 2023-06-26 DIAGNOSIS — I10 Essential (primary) hypertension: Secondary | ICD-10-CM

## 2023-06-26 DIAGNOSIS — Z95811 Presence of heart assist device: Secondary | ICD-10-CM

## 2023-06-26 NOTE — Assessment & Plan Note (Signed)
Patient is seen by Cardiology, who manage this condition.  He is well controlled with current therapy.   Will defer to them for further changes to plan of care.  

## 2023-06-26 NOTE — Assessment & Plan Note (Signed)
 Continue current meds.  Will adjust as needed based on results.  The patient is asked to make an attempt to improve diet and exercise patterns to aid in medical management of this problem. Addressed importance of increasing and maintaining water intake.

## 2023-06-26 NOTE — Assessment & Plan Note (Signed)
Patient is seen by Hematology/Oncology, who manage this condition.  He is well controlled with current therapy.   Will defer to them for further changes to plan of care.

## 2023-06-26 NOTE — Assessment & Plan Note (Signed)
Patient educated on foods that contain carbohydrates and the need to decrease intake.  We discussed prediabetes, and what it means and the need for strict dietary control to prevent progression to type 2 diabetes.  Advised to decrease intake of sugary drinks, including sodas, sweet tea, and some juices, and of starch and sugar heavy foods (ie., potatoes, rice, bread, pasta, desserts). He verbalizes understanding and agreement with the changes discussed today.   A1C Continues to be in prediabetic ranges.  Will reassess at follow up after next lab check.  Patient counseled on dietary choices and verbalized understanding.

## 2023-06-26 NOTE — Progress Notes (Signed)
 Established Patient Office Visit  Subjective:  Patient ID: Edward Pitts, male    DOB: [birth year]  Age: 63 y.o. MRN: [id number]  Chief Complaint  Patient presents with   Follow-up    3 month follow up    Patient is here today for his 4 months follow up.  He has been feeling fairly well since last appointment.   He does have additional concerns to discuss today.  Did go to see the sleep doctor.  Had an issue with [employer], they are getting a copy of what the machine is saying so that he can see it.   Has appointment to get port flushed next week, sees oncology in June - will go to 6 month follow up if everything is normal when he sees them.   Has [facility] appointment next week as well.  Labs are due today. He needs refills.   I have reviewed his active problem list, medication list, allergies, health maintenance, notes from last encounter, lab results for his appointment today.    No other concerns at this time.   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   Elevated serum creatinine 04/15/2021   Exposure to combat 04/20/2020   Exposure to potentially hazardous substance 09/18/2022   Sep 11, 2022 Entered By: Derinda Late Comment: Entered automatically through [facility] Problem List documentation program     Family history of adverse reaction to anesthesia    PONV mother   GERD (gastroesophageal reflux disease)    H/O Malignant melanoma 09/18/2022   History of complete heart block    PPM placed   Hyperlipidemia    Hypertension    LBBB (left bundle branch block)    Lymphedema of left leg    uses thigh high compression  stockings   Melanoma (HCC) 2012   skin cancer, left thigh   OSA on CPAP    PONV (postoperative nausea and vomiting) 04/16/2019   Port-A-Cath in place    RIGHT chest wall   Presence of cardiac pacemaker    [employer]   Problem related to unspecified psychosocial circumstances 09/18/2022   Seizures (HCC)    still has episodes of dizziness. last event 1 month ago (march 2022) and will pass out. takes clonazepam    Past Surgical History:  Procedure Laterality Date   CARDIOVERSION N/A 05/15/2023   Procedure: CARDIOVERSION;  Surgeon: Marcina Millard, MD;  Location: [facility] ORS;  Service: Cardiovascular;  Laterality: N/A;   CORONARY ARTERY BYPASS GRAFT N/A 08/10/2021   Procedure: CORONARY ARTERY BYPASS GRAFTING (CABG) X 3 USING LEFT INTERNAL MAMMARY ARTERY AND RIGHT GREATER SAPHENOUS VEIN;  Surgeon: Lovett Sox, MD;  Location: [facility] OR;  Service: Open Heart Surgery;  Laterality: N/A;   CT RADIATION THERAPY GUIDE     left groin   dental implant     permanent implant   ENDOVEIN HARVEST OF GREATER SAPHENOUS VEIN Right 08/10/2021   Procedure: ENDOVEIN HARVEST OF GREATER SAPHENOUS VEIN;  Surgeon: Lovett Sox, MD;  Location: [facility] OR;  Service: Open Heart Surgery;  Laterality:  Right;   KNEE SURGERY Left    arthroscopy   LEFT HEART CATH AND CORONARY ANGIOGRAPHY Left 06/29/2017   Procedure: LEFT HEART CATH AND CORONARY ANGIOGRAPHY;  Surgeon: Lamar Blinks, MD;  Location: [facility] INVASIVE CV LAB;  Service: Cardiovascular;  Laterality: Left;   LEFT HEART CATH AND CORONARY ANGIOGRAPHY N/A 08/08/2021   Procedure: LEFT HEART CATH AND CORONARY ANGIOGRAPHY;  Surgeon: Lamar Blinks, MD;  Location: [facility] INVASIVE CV LAB;  Service: Cardiovascular;  Laterality: N/A;   LYMPH NODE DISSECTION Left 04/16/2019   Procedure: Left inguinal Lymph Node Dissection;  Surgeon: Almond Lint, MD;  Location: [facility];  Service: General;  Laterality: Left;   MELANOMA EXCISION Left 04/16/2019   Procedure: MELANOMA EXCISION LEFT  GROIN MASS;  Surgeon: Almond Lint, MD;  Location: [facility] OR;  Service: General;  Laterality: Left;   MELANOMA EXCISION WITH SENTINEL LYMPH NODE BIOPSY Left 2012   Left calf    PACEMAKER INSERTION N/A 08/26/2018   Procedure: INSERTION PACEMAKER;  Surgeon: Marcina Millard, MD;  Location: [facility] ORS;  Service: Cardiovascular;  Laterality: N/A;   PORTA CATH INSERTION N/A 08/26/2019   Procedure: PORTA CATH INSERTION;  Surgeon: Renford Dills, MD;  Location: [facility] INVASIVE CV LAB;  Service: Cardiovascular;  Laterality: N/A;   SUPERFICIAL LYMPH NODE BIOPSY / EXCISION Left 2020   lymph nodes removed around left groin melanoma site   TEE WITHOUT CARDIOVERSION N/A 08/10/2021   Procedure: TRANSESOPHAGEAL ECHOCARDIOGRAM (TEE);  Surgeon: Lovett Sox, MD;  Location: [facility] OR;  Service: Open Heart Surgery;  Laterality: N/A;   TEMPORARY PACEMAKER N/A 08/25/2018   Procedure: TEMPORARY PACEMAKER;  Surgeon: Tonny Bollman, MD;  [facility] INVASIVE CV LAB;  Service: Cardiovascular;  Laterality: N/A;   TOTAL HIP ARTHROPLASTY Left 07/14/2020   Procedure: TOTAL HIP ARTHROPLASTY;  Surgeon: Donato Heinz, MD;  Location: [facility] ORS;  Service: Orthopedics;  Laterality: Left;    Social History   Socioeconomic History   Marital status: Married    Spouse name: [name]    Number of children: 7   Years of education: 12   Highest education level: Not on file  Occupational History    Comment: disability  Tobacco Use   Smoking status: Never   Smokeless tobacco: Never  Vaping Use   Vaping status: Never Used  Substance and Sexual Activity   Alcohol use: No   Drug use: No   Sexual activity: Not Currently  Other Topics Concern   Not on file  Social History Narrative   Lives with  Wife,   Has 2 small dogs   Caffeine use: sodas (2 per day)      Out of work on disability.  Has a walk in shower. No stairs to climb   Oncology treatment ongoing. Uses port a cath for treatment.      pacemaker   Social Drivers of  Health   Financial Resource Strain: Low Risk  (02/12/2019)   Overall Financial Resource Strain (CARDIA)    Difficulty of Paying Living Expenses: Not hard at all  Food Insecurity: No Food Insecurity (03/15/2023)   Hunger Vital Sign    Worried About Running Out of Food in the Last Year: Never true    Ran Out of Food in the Last Year: Never true  Transportation Needs: No Transportation Needs (03/15/2023)   PRAPARE - Administrator, Civil Service (Medical): No    Lack of Transportation (Non-Medical): No  Physical Activity: Inactive (03/15/2023)   Exercise Vital Sign  Days of Exercise per Week: 0 days    Minutes of Exercise per Session: 0 min  Stress: No Stress Concern Present (03/15/2023)   [employer] of Occupational Health - Occupational Stress Questionnaire    Feeling of Stress : Not at all  Social Connections: Unknown (02/12/2019)   Social Connection and Isolation Panel [NHANES]    Frequency of Communication with Friends and Family: More than three times a week    Frequency of Social Gatherings with Friends and Family: Not on file    Attends Religious Services: Not on file    Active Member of Clubs or Organizations: Not on file    Attends Banker Meetings: Not on file    Marital Status: Married  Intimate Partner Violence: Not At Risk (03/15/2023)   Humiliation, Afraid, Rape, and Kick questionnaire    Fear of Current or Ex-Partner: No    Emotionally Abused: No    Physically Abused: No    Sexually Abused: No    Family History  Problem Relation Age of Onset   Cancer Paternal Grandmother     Allergies  Allergen Reactions   Ibuprofen Other (See Comments)    Affects kidneys   Keppra [Levetiracetam] Hives and Rash    Other reaction(s): Dizziness, Headache   Nsaids     Affects kidneys   Capecitabine Rash    Other Reaction(s): Eruption of skin    ROS     Objective:   BP (!) 146/62   Pulse 80   Ht 5\' 7"  (1.702 m)   Wt 256 lb 9.6 oz  (116.4 kg)   SpO2 95%   BMI 40.19 kg/m   Vitals:   06/26/23 0953  BP: (!) 146/62  Pulse: 80  Height: 5\' 7"  (1.702 m)  Weight: 256 lb 9.6 oz (116.4 kg)  SpO2: 95%  BMI (Calculated): 40.18    Physical Exam   No results found for any visits on 06/26/23.  No results found for this or any previous visit (from the past 2160 hours).     Assessment & Plan:   Problem List Items Addressed This Visit       Cardiovascular and Mediastinum   HTN (hypertension) - Primary   Blood pressure well controlled with current medications.  Continue current therapy.  Will reassess at follow up.          Other   HLD (hyperlipidemia)   Continue current therapy for lipid control. Will modify as needed based on labwork results.        Anemia (Chronic)   Patient is seen by Hematology/Oncology, who manage this condition.  He is well controlled with current therapy.   Will defer to them for further changes to plan of care.       Obesity, Class III, BMI 40-49.9 (morbid obesity) (HCC)   Continue current meds.  Will adjust as needed based on results.  The patient is asked to make an attempt to improve diet and exercise patterns to aid in medical management of this problem. Addressed importance of increasing and maintaining water intake.       Prediabetes   Patient educated on foods that contain carbohydrates and the need to decrease intake.  We discussed prediabetes, and what it means and the need for strict dietary control to prevent progression to type 2 diabetes.  Advised to decrease intake of sugary drinks, including sodas, sweet tea, and some juices, and of starch and sugar heavy foods (ie., potatoes, rice, bread, pasta, desserts). He verbalizes  understanding and agreement with the changes discussed today.  A1C Continues to be in prediabetic ranges.  Will reassess at follow up after next lab check.  Patient counseled on dietary choices and verbalized understanding.         Presence of heart assist device [facility])   Patient is seen by Cardiology, who manage this condition.  He is well controlled with current therapy.   Will defer to them for further changes to plan of care.        Return in about 4 months (around 10/26/2023).   Total time spent: 20 minutes  Miki Kins, FNP  06/26/2023   This document may have been prepared by [facility] Voice Recognition software and as such may include unintentional dictation errors.

## 2023-06-26 NOTE — Assessment & Plan Note (Signed)
 Continue current therapy for lipid control. Will modify as needed based on labwork results.

## 2023-06-26 NOTE — Assessment & Plan Note (Signed)
 Blood pressure well controlled with current medications.  Continue current therapy.  Will reassess at follow up.

## 2023-06-27 ENCOUNTER — Inpatient Hospital Stay: Payer: 59 | Attending: Oncology

## 2023-06-27 ENCOUNTER — Other Ambulatory Visit: Payer: Self-pay

## 2023-06-27 DIAGNOSIS — Z8505 Personal history of malignant neoplasm of liver: Secondary | ICD-10-CM | POA: Insufficient documentation

## 2023-06-27 DIAGNOSIS — Z85820 Personal history of malignant melanoma of skin: Secondary | ICD-10-CM | POA: Diagnosis not present

## 2023-06-27 DIAGNOSIS — Z452 Encounter for adjustment and management of vascular access device: Secondary | ICD-10-CM | POA: Diagnosis not present

## 2023-06-27 DIAGNOSIS — Z95828 Presence of other vascular implants and grafts: Secondary | ICD-10-CM

## 2023-06-27 MED ORDER — HEPARIN SOD (PORK) LOCK FLUSH 100 UNIT/ML IV SOLN
500.0000 [IU] | Freq: Once | INTRAVENOUS | Status: AC
  Administered 2023-06-27: 500 [IU] via INTRAVENOUS
  Filled 2023-06-27: qty 5

## 2023-06-27 MED ORDER — SODIUM CHLORIDE 0.9% FLUSH
10.0000 mL | INTRAVENOUS | Status: DC | PRN
  Administered 2023-06-27: 10 mL via INTRAVENOUS
  Filled 2023-06-27: qty 10

## 2023-06-27 NOTE — Patient Instructions (Signed)

## 2023-07-02 ENCOUNTER — Telehealth: Payer: Self-pay | Admitting: Internal Medicine

## 2023-07-02 NOTE — Telephone Encounter (Signed)
 Patient called to reschedule 6/6 appointment due to being out of town. Appointment rescheduled as requested

## 2023-07-04 DIAGNOSIS — I442 Atrioventricular block, complete: Secondary | ICD-10-CM | POA: Diagnosis not present

## 2023-07-04 DIAGNOSIS — G4733 Obstructive sleep apnea (adult) (pediatric): Secondary | ICD-10-CM | POA: Diagnosis not present

## 2023-07-04 DIAGNOSIS — Z950 Presence of cardiac pacemaker: Secondary | ICD-10-CM | POA: Diagnosis not present

## 2023-07-04 DIAGNOSIS — I214 Non-ST elevation (NSTEMI) myocardial infarction: Secondary | ICD-10-CM | POA: Diagnosis not present

## 2023-07-04 DIAGNOSIS — I483 Typical atrial flutter: Secondary | ICD-10-CM | POA: Diagnosis not present

## 2023-07-10 DIAGNOSIS — I442 Atrioventricular block, complete: Secondary | ICD-10-CM | POA: Diagnosis not present

## 2023-07-17 DIAGNOSIS — Z96642 Presence of left artificial hip joint: Secondary | ICD-10-CM | POA: Diagnosis not present

## 2023-07-19 DIAGNOSIS — I214 Non-ST elevation (NSTEMI) myocardial infarction: Secondary | ICD-10-CM | POA: Diagnosis not present

## 2023-07-19 DIAGNOSIS — Z950 Presence of cardiac pacemaker: Secondary | ICD-10-CM | POA: Diagnosis not present

## 2023-07-19 DIAGNOSIS — I483 Typical atrial flutter: Secondary | ICD-10-CM | POA: Diagnosis not present

## 2023-07-19 DIAGNOSIS — I442 Atrioventricular block, complete: Secondary | ICD-10-CM | POA: Diagnosis not present

## 2023-07-25 ENCOUNTER — Other Ambulatory Visit: Payer: Self-pay | Admitting: Internal Medicine

## 2023-07-25 DIAGNOSIS — R569 Unspecified convulsions: Secondary | ICD-10-CM

## 2023-07-25 NOTE — Telephone Encounter (Signed)
 Dose was increased to 150 mg BID

## 2023-07-30 ENCOUNTER — Ambulatory Visit: Payer: 59 | Admitting: Internal Medicine

## 2023-07-30 ENCOUNTER — Encounter: Payer: Self-pay | Admitting: Internal Medicine

## 2023-07-30 VITALS — BP 130/80 | HR 80 | Temp 98.5°F | Ht 67.0 in | Wt 254.8 lb

## 2023-07-30 DIAGNOSIS — Z6839 Body mass index (BMI) 39.0-39.9, adult: Secondary | ICD-10-CM

## 2023-07-30 DIAGNOSIS — R5381 Other malaise: Secondary | ICD-10-CM

## 2023-07-30 DIAGNOSIS — R0689 Other abnormalities of breathing: Secondary | ICD-10-CM

## 2023-07-30 DIAGNOSIS — E669 Obesity, unspecified: Secondary | ICD-10-CM | POA: Diagnosis not present

## 2023-07-30 DIAGNOSIS — G4733 Obstructive sleep apnea (adult) (pediatric): Secondary | ICD-10-CM | POA: Diagnosis not present

## 2023-07-30 NOTE — Progress Notes (Signed)
 Name: Edward Pitts MRN: [id number] DOB: 1960/08/14    CHIEF COMPLAINT:  Follow-up assessment for OSA    HISTORY OF PRESENT ILLNESS: Follow-up assessment for OSA Encouraged proper weight management.  Discussed driving precautions and its relationship with hypersomnolence.  Discussed operating dangerous equipment and its relationship with hypersomnolence.  Discussed sleep hygiene, and benefits of a fixed sleep waked time.  The importance of getting eight or more hours of sleep discussed with patient.  Discussed limiting the use of the computer and television before bedtime.  Decrease naps during the day, so night time sleep will become enhanced.  Limit caffeine, and sleep deprivation.  HTN, stroke, and heart failure are potential risk factors.   Patient diagnosed with sleep apnea several years ago Patient currently states that he is using CPAP on a nightly basis Patient reports compliance Patient uses fullface mask   Patient's current CPAP machine is unable to provide us  any information DME company [employer] was contacted several times by our nurse navigator They also have been having issues with his download and machine I recommend a new machine   No exacerbation at this time No evidence of heart failure at this time No evidence or signs of infection at this time No respiratory distress No fevers, chills, nausea, vomiting, diarrhea No evidence of lower extremity edema No evidence hemoptysis    Pulmonary function test reviewed in detail with patient 2023 FEV1 to FVC ratio is 73% predicted FEV1 was 71% predicted FVC was 73% predicted Expiratory flow volume loops showed obstructive pattern ERV elevated at 111% predicted suggesting air trapping  PAST MEDICAL HISTORY :   has a past medical history of AKI (acute kidney injury) (HCC) (08/24/2018), Anemia, Anxiety, Aortic atherosclerosis (HCC), Arthritis, Cancer of groin (HCC) (2021), Cataract, Complication of anesthesia,  Coronary artery disease, Dizziness (12/22/2019), Dizziness of unknown etiology, Elevated serum creatinine (04/15/2021), Exposure to combat (04/20/2020), Exposure to potentially hazardous substance (09/18/2022), Family history of adverse reaction to anesthesia, GERD (gastroesophageal reflux disease), H/O Malignant melanoma (09/18/2022), History of complete heart block, Hyperlipidemia, Hypertension, LBBB (left bundle branch block), Lymphedema of left leg, Melanoma (HCC) (2012), OSA on CPAP, PONV (postoperative nausea and vomiting) (04/16/2019), Port-A-Cath in place, Presence of cardiac pacemaker, Problem related to unspecified psychosocial circumstances (09/18/2022), and Seizures (HCC).  has a past surgical history that includes Knee surgery (Left); LEFT HEART CATH AND CORONARY ANGIOGRAPHY (Left, 06/29/2017); TEMPORARY PACEMAKER (N/A, 08/25/2018); Pacemaker insertion (N/A, 08/26/2018); Melanoma excision with sentinel lymph node dissection (Left, 2012); Melanoma excision (Left, 04/16/2019); Lymph node dissection (Left, 04/16/2019); PORTA CATH INSERTION (N/A, 08/26/2019); Superficial lymph node biopsy / excision (Left, 2020); dental implant; CT RADIATION THERAPY GUIDE; Total hip arthroplasty (Left, 07/14/2020); LEFT HEART CATH AND CORONARY ANGIOGRAPHY (N/A, 08/08/2021); Coronary artery bypass graft (N/A, 08/10/2021); TEE without cardioversion (N/A, 08/10/2021); Endoharvest vein of greater saphenous vein (Right, 08/10/2021); and Cardioversion (N/A, 05/15/2023). Prior to Admission medications   Medication Sig Start Date End Date Taking? Authorizing Provider  ALPRAZolam  (XANAX ) 0.5 MG tablet Take 1 tablet (0.5 mg total) by mouth 2 (two) times daily as needed for anxiety. 08/23/22   Timmy Forbes, MD  apixaban  (ELIQUIS ) 5 MG TABS tablet Take 5 mg by mouth 2 (two) times daily.    [provider]  atorvastatin  (LIPITOR) 20 MG tablet Take 20 mg by mouth daily. 03/08/23   [provider]  Carboxymeth-Glyc-Polysorb PF  (REFRESH OPTIVE MEGA-3) 0.5-1-0.5 % SOLN Place 1 drop into both eyes daily as needed (dry eyes).    [provider]  clonazePAM  (KLONOPIN ) 0.5 MG tablet TAKE 1 TABLET BY MOUTH IN THE MORNING AND 1 BY MOUTH IN THE EVENING PER PT 02/06/23   Vaslow, Zachary K, MD  clotrimazole  (CLOTRIMAZOLE  ANTI-FUNGAL) 1 % cream Apply 1 Application topically 2 (two) times daily. Apply to the L inguinal fold 06/07/22   Heather Litter, MD  febuxostat  (ULORIC ) 40 MG tablet Take 40 mg by mouth daily.    [provider]  hydrocortisone  2.5 % ointment Apply 1 application. topically daily as needed (itching).    [provider]  Lacosamide  100 MG TABS TAKE 1 TABLET (100 MG TOTAL) BY MOUTH IN THE MORNING AND AT BEDTIME. 04/26/23   Vaslow, Zachary K, MD  lidocaine -prilocaine  (EMLA ) cream Apply 1 Application topically as needed. Apply small amount of cream to port site approx 1-2 hours prior to appointment. 11/01/22   Timmy Forbes, MD  lisinopril  (ZESTRIL ) 10 MG tablet Take 10 mg by mouth daily.    [provider]  metoprolol  succinate (TOPROL -XL) 25 MG 24 hr tablet Take 25 mg by mouth daily. 12/15/18   [provider]  Multiple Vitamin (MULTIVITAMIN WITH MINERALS) TABS tablet Take 1 tablet by mouth daily. Centrum Silver     [provider]  nystatin  (MYCOSTATIN /NYSTOP ) powder Apply 1 Application topically 3 (three) times daily. 06/14/22   Timmy Forbes, MD  omeprazole  (PRILOSEC) 20 MG capsule TAKE 1 CAPSULE BY MOUTH EVERY DAY 02/26/23   Timmy Forbes, MD  ondansetron  (ZOFRAN ) 4 MG tablet TAKE 1 TABLET BY MOUTH EVERY 8 HOURS AS NEEDED FOR NAUSEA AND VOMITING 01/26/22   Borders, Joshua R, NP  traZODone  (DESYREL ) 50 MG tablet Take 50 mg by mouth at bedtime as needed. 08/05/22   [provider]   Allergies  Allergen Reactions   Ibuprofen  Other (See Comments)    Affects kidneys   Keppra  [Levetiracetam ] Hives and Rash    Other reaction(s): Dizziness, Headache   Nsaids     Affects  kidneys   Capecitabine  Rash    Other Reaction(s): Eruption of skin    FAMILY HISTORY:  family history includes Cancer in his paternal grandmother. SOCIAL HISTORY:  reports that he has never smoked. He has never used smokeless tobacco. He reports that he does not drink alcohol  and does not use drugs.   BP 130/80 (BP Location: Right Arm, Patient Position: Sitting, Cuff Size: Large)   Pulse 80   Temp 98.5 F (36.9 C) (Oral)   Ht 5\' 7"  (1.702 m)   Wt 254 lb 12.8 oz (115.6 kg)   SpO2 95%   BMI 39.91 kg/m   Review of Systems: Gen:  Denies  fever, sweats, chills weight loss  HEENT: Denies blurred vision, double vision, ear pain, eye pain, hearing loss, nose bleeds, sore throat Cardiac:  No dizziness, chest pain or heaviness, chest tightness,edema, No JVD Resp:   No cough, -sputum production, -shortness of breath,-wheezing, -hemoptysis,  Other:  All other systems negative   Physical Examination:   General Appearance: No distress  EYES PERRLA, EOM intact.   NECK Supple, No JVD Pulmonary: normal breath sounds, No wheezing.  CardiovascularNormal S1,S2.  No m/r/g.   Abdomen: Benign, Soft, non-tender. Neurology UE/LE 5/5 strength, no focal deficits Ext pulses intact, cap refill intact ALL OTHER ROS ARE NEGATIVE      ASSESSMENT AND PLAN SYNOPSIS  63 year old white male patient with signs and symptoms of excessive daytime sleepiness with probable underlying diagnosis of obstructive sleep apnea in the setting of obesity and deconditioned state  Underlying  OSA Difficult to assess compliance due to the fact his machine is not able to provide us  a download DME company is also having a hard time obtaining this information Recommend NEW CPAP MACHINE  Patient Instructions  Continue to use CPAP every night, minimum of 4-6 hours a night.  Change equipment every 30 days or as directed by DME.  Wash your tubing with warm soap and water  daily, hang to dry. Wash humidifier portion  weekly. Use bottled, distilled water  and change daily   Be aware of reduced alertness and do not drive or operate heavy machinery if experiencing this or drowsiness.  Exercise encouraged, as tolerated. Encouraged proper weight management.  Important to get eight or more hours of sleep  Limiting the use of the computer and television before bedtime.  Decrease naps during the day, so night time sleep will become enhanced.  Limit caffeine, and sleep deprivation.  HTN, stroke, uncontrolled diabetes and heart failure are potential risk factors.  Risk of untreated sleep apnea including cardiac arrhthymias, stroke, DM, pulm HTN.   Obesity -recommend significant weight loss -recommend changing diet  Deconditioned state -Recommend increased daily activity and exercise    MEDICATION ADJUSTMENTS/LABS AND TESTS ORDERED: Continue CPAP as prescribed Avoid Allergens and Irritants Avoid secondhand smoke Avoid SICK contacts Recommend  Masking  when appropriate Recommend Keep up-to-date with vaccinations DME company to follow-up with patient   CURRENT MEDICATIONS REVIEWED AT LENGTH WITH PATIENT TODAY   Patient  satisfied with Plan of action and management. All questions answered  Follow up  3 months  I spent a total of  41 minutes reviewing chart data, face-to-face evaluation with the patient, counseling and coordination of care as detailed above.    Lady Pier, M.D.  [facility]  Medical Director [facility] Medical Director [facility] Cardio-Pulmonary Department

## 2023-07-30 NOTE — Patient Instructions (Signed)
 Recommend DME referral for new CPAP machine  Continue CPAP as prescribed   Continue to use CPAP every night, minimum of 4-6 hours a night.  Change equipment every 30 days or as directed by DME.  Wash your tubing with warm soap and water  daily, hang to dry. Wash humidifier portion weekly. Use bottled, distilled water  and change daily   Be aware of reduced alertness and do not drive or operate heavy machinery if experiencing this or drowsiness.  Exercise encouraged, as tolerated. Encouraged proper weight management.  Important to get eight or more hours of sleep  Limiting the use of the computer and television before bedtime.  Decrease naps during the day, so night time sleep will become enhanced.  Limit caffeine, and sleep deprivation.    Avoid Allergens and Irritants Avoid secondhand smoke Avoid SICK contacts Recommend  Masking  when appropriate Recommend Keep up-to-date with vaccinations  Recommend weight loss

## 2023-07-31 ENCOUNTER — Other Ambulatory Visit: Payer: Self-pay

## 2023-08-20 ENCOUNTER — Emergency Department

## 2023-08-20 ENCOUNTER — Emergency Department
Admission: EM | Admit: 2023-08-20 | Discharge: 2023-08-20 | Disposition: A | Discharge status: Home / Self Care | Attending: Emergency Medicine | Admitting: Emergency Medicine

## 2023-08-20 ENCOUNTER — Other Ambulatory Visit: Payer: Self-pay

## 2023-08-20 ENCOUNTER — Encounter: Payer: Self-pay | Admitting: Emergency Medicine

## 2023-08-20 DIAGNOSIS — Z85820 Personal history of malignant melanoma of skin: Secondary | ICD-10-CM | POA: Insufficient documentation

## 2023-08-20 DIAGNOSIS — R519 Headache, unspecified: Secondary | ICD-10-CM | POA: Insufficient documentation

## 2023-08-20 DIAGNOSIS — R0602 Shortness of breath: Secondary | ICD-10-CM | POA: Diagnosis not present

## 2023-08-20 DIAGNOSIS — I6523 Occlusion and stenosis of bilateral carotid arteries: Secondary | ICD-10-CM | POA: Diagnosis not present

## 2023-08-20 DIAGNOSIS — Z950 Presence of cardiac pacemaker: Secondary | ICD-10-CM | POA: Diagnosis not present

## 2023-08-20 DIAGNOSIS — Z85028 Personal history of other malignant neoplasm of stomach: Secondary | ICD-10-CM | POA: Insufficient documentation

## 2023-08-20 DIAGNOSIS — H547 Unspecified visual loss: Secondary | ICD-10-CM | POA: Diagnosis not present

## 2023-08-20 DIAGNOSIS — R42 Dizziness and giddiness: Secondary | ICD-10-CM | POA: Insufficient documentation

## 2023-08-20 DIAGNOSIS — N189 Chronic kidney disease, unspecified: Secondary | ICD-10-CM | POA: Insufficient documentation

## 2023-08-20 DIAGNOSIS — Z7901 Long term (current) use of anticoagulants: Secondary | ICD-10-CM | POA: Diagnosis not present

## 2023-08-20 DIAGNOSIS — Z452 Encounter for adjustment and management of vascular access device: Secondary | ICD-10-CM | POA: Diagnosis not present

## 2023-08-20 DIAGNOSIS — I672 Cerebral atherosclerosis: Secondary | ICD-10-CM | POA: Diagnosis not present

## 2023-08-20 LAB — CBC
HCT: 34 % — ABNORMAL LOW (ref 39.0–52.0)
Hemoglobin: 11.4 g/dL — ABNORMAL LOW (ref 13.0–17.0)
MCH: 30.2 pg (ref 26.0–34.0)
MCHC: 33.5 g/dL (ref 30.0–36.0)
MCV: 90.2 fL (ref 80.0–100.0)
Platelets: 157 10*3/uL (ref 150–400)
RBC: 3.77 MIL/uL — ABNORMAL LOW (ref 4.22–5.81)
RDW: 13.2 % (ref 11.5–15.5)
WBC: 4.3 10*3/uL (ref 4.0–10.5)
nRBC: 0 % (ref 0.0–0.2)

## 2023-08-20 LAB — BASIC METABOLIC PANEL WITH GFR
Anion gap: 7 (ref 5–15)
BUN: 17 mg/dL (ref 8–23)
CO2: 26 mmol/L (ref 22–32)
Calcium: 8.5 mg/dL — ABNORMAL LOW (ref 8.9–10.3)
Chloride: 108 mmol/L (ref 98–111)
Creatinine, Ser: 1.08 mg/dL (ref 0.61–1.24)
GFR, Estimated: >60 mL/min (ref >60)
Glucose, Bld: 135 mg/dL — ABNORMAL HIGH (ref 70–99)
Potassium: 3.9 mmol/L (ref 3.5–5.1)
Sodium: 141 mmol/L (ref 135–145)

## 2023-08-20 LAB — D-DIMER, QUANTITATIVE: D-Dimer, Quant: 0.38 ug{FEU}/mL (ref 0.00–0.50)

## 2023-08-20 LAB — HEPATIC FUNCTION PANEL
ALT: 64 U/L — ABNORMAL HIGH (ref 0–44)
AST: 42 U/L — ABNORMAL HIGH (ref 15–41)
Albumin: 3.8 g/dL (ref 3.5–5.0)
Alkaline Phosphatase: 59 U/L (ref 38–126)
Bilirubin, Direct: <0.1 mg/dL (ref 0.0–0.2)
Total Bilirubin: 0.6 mg/dL (ref 0.0–1.2)
Total Protein: 7 g/dL (ref 6.5–8.1)

## 2023-08-20 LAB — TROPONIN I (HIGH SENSITIVITY)
Troponin I (High Sensitivity): 5 ng/L (ref <18)
Troponin I (High Sensitivity): 6 ng/L (ref <18)

## 2023-08-20 LAB — SEDIMENTATION RATE: Sed Rate: 48 mm/h — ABNORMAL HIGH (ref 0–20)

## 2023-08-20 MED ORDER — PROCHLORPERAZINE MALEATE 10 MG PO TABS: 10.0000 mg | ORAL_TABLET | Freq: Four times a day (QID) | ORAL | 0 refills | Status: AC | PRN

## 2023-08-20 MED ORDER — IOHEXOL 350 MG/ML SOLN
75.0000 mL | Freq: Once | INTRAVENOUS | Status: AC | PRN
  Administered 2023-08-20: 75 mL via INTRAVENOUS

## 2023-08-20 MED ORDER — PROCHLORPERAZINE EDISYLATE 10 MG/2ML IJ SOLN
10.0000 mg | Freq: Once | INTRAMUSCULAR | Status: AC
Start: 2023-08-20 — End: 2023-08-20
  Administered 2023-08-20: 10 mg via INTRAVENOUS
  Filled 2023-08-20: qty 2

## 2023-08-20 MED ORDER — TETRACAINE HCL 0.5 % OP SOLN
1.0000 [drp] | Freq: Once | OPHTHALMIC | Status: AC
  Administered 2023-08-20: 1 [drp] via OPHTHALMIC
  Filled 2023-08-20: qty 4

## 2023-08-20 MED ORDER — ACETAMINOPHEN 500 MG PO TABS
1000.0000 mg | ORAL_TABLET | Freq: Once | ORAL | Status: AC
  Administered 2023-08-20: 1000 mg via ORAL
  Filled 2023-08-20: qty 2

## 2023-08-20 MED ORDER — SODIUM CHLORIDE 0.9 % IV BOLUS
500.0000 mL | Freq: Once | INTRAVENOUS | Status: AC
  Administered 2023-08-20: 500 mL via INTRAVENOUS

## 2023-08-20 NOTE — ED Triage Notes (Addendum)
 Pt via POV from home. Pt c/o SOB started, reports that he has been having issues with his pacemaker, states he feels like it wasn't working. Pt states this has happened before in March and has to go to [facility] to get it repaired. Denies CP. States he takes Eliquis . Pt is A&Ox4 and NAD, ambulatory to triage steady gait

## 2023-08-20 NOTE — ED Provider Notes (Signed)
 [facility] Provider Note    Event Date/Time   First MD Initiated Contact with Patient 08/20/23 1148     (approximate)   History   Shortness of Breath   HPI  Edward Pitts is a 63 y.o. male past medical history significant for anemia, CKD, history of cholangiocarcinoma and malignant melanoma, history of atrial fibrillation/flutter on Eliquis , seizure disorder, heart block with Medtronic pacemaker, presents to the emergency department with lightheadedness and not feeling well.  States that he had a mild headache yesterday and was not feeling well so he took his seizure medication and states that he started feeling better after that.  Today got up and went to [facility] and started having a headache with lightheadedness and dizziness.  States that he was feeling somewhat short of breath so he had his wife drive him to the emergency department.  Slow worsening of headache and not the worst headache of his leg.  Currently complaining of a pressure sensation around his head.  No jaw claudication.  Does endorse some blurriness of vision with both eyes.  No double vision.  Shortness of breath and chest pain earlier today but denies any active pain or shortness of breath at this time.  Denies nausea or vomiting.  No dysuria.  He was concerned that he was having some issues with his pacemaker.  History of cardioversion.  No history of DVT or PE.  Surveillance studies every 4 months with [facility] for evaluation of recurrence of cancer.   On chart review patient had a history of cholangiocarcinoma and malignant melanoma.  Patient completed a course of chemotherapy and radiation.  No recurrence on surveillance CT scans done 01/02/2023.  No evidence of metastatic melanoma on recent CT scans.  Off immunotherapy at this time.      Physical Exam   Triage Vital Signs: ED Triage Vitals  Encounter Vitals Group     BP 08/20/23 1044 (!) 159/71     Systolic BP Percentile --      Diastolic BP  Percentile --      Pulse Rate 08/20/23 1044 82     Resp 08/20/23 1044 18     Temp 08/20/23 1044 98.5 F (36.9 C)     Temp Source 08/20/23 1044 Oral     SpO2 08/20/23 1044 96 %     Weight 08/20/23 1043 251 lb (113.9 kg)     Height 08/20/23 1043 5\' 7"  (1.702 m)     Head Circumference --      Peak Flow --      Pain Score 08/20/23 1043 0     Pain Loc --      Pain Education --      Exclude from Growth Chart --     Most recent vital signs: Vitals:   08/20/23 1044 08/20/23 1444  BP: (!) 159/71   Pulse: 82   Resp: 18   Temp: 98.5 F (36.9 C) 97.9 F (36.6 C)  SpO2: 96%     Physical Exam Constitutional:      Appearance: He is well-developed.  HENT:     Head: Atraumatic.  Eyes:     Intraocular pressure: Right eye pressure is 24 mmHg. Left eye pressure is 20 mmHg.     Conjunctiva/sclera: Conjunctivae normal.     Comments: (Pt states chronic elevated IOP bilaterally)   Cardiovascular:     Rate and Rhythm: Regular rhythm.  Pulmonary:     Effort: No respiratory distress.  Chest:  Comments: Port-A-Cath, pacemaker Musculoskeletal:        General: Normal range of motion.     Cervical back: Normal range of motion.     Right lower leg: No edema.     Left lower leg: No edema.  Skin:    General: Skin is warm.     Capillary Refill: Capillary refill takes less than 2 seconds.  Neurological:     General: No focal deficit present.     Mental Status: He is alert. Mental status is at baseline.     GCS: GCS eye subscore is 4. GCS verbal subscore is 5. GCS motor subscore is 6.     Cranial Nerves: Cranial nerves 2-12 are intact.     Sensory: Sensation is intact.     Motor: Motor function is intact.     Coordination: Coordination is intact.     IMPRESSION / MDM / ASSESSMENT AND PLAN / ED COURSE  I reviewed the triage vital signs and the nursing notes.  Differential diagnosis including ACS, dysrhythmia, pulmonary embolism, intracranial hemorrhage, malignancy  Patient with no  dysmetria, no double vision and no room spinning.  Has a nonfocal neurologic exam at this time have a low suspicion for CVA.  No falls or head trauma.  Low suspicion for subarachnoid hemorrhage, not thunderclap headache, not worst headache of his life.  Headache onset approximately 3 hours ago.   EKG  I, Viviano Ground, the attending physician, personally viewed and interpreted this ECG.  Paced rhythm, rate 78.  Negative Sgarbossa's criteria.  Change in morphology when compared to prior EKG.  No tachycardic or bradycardic dysrhythmias while on cardiac telemetry.  RADIOLOGY I independently reviewed imaging, my interpretation of imaging: Chest x-ray with no signs of pneumonia, no widened mediastinum.  Dual-lead pacemaker and right sided Port-A-Cath are in appropriate position.  CT scan of the head -no findings of intracranial hemorrhage.  LABS (all labs ordered are listed, but only abnormal results are displayed) Labs interpreted as -    Labs Reviewed  BASIC METABOLIC PANEL WITH GFR - Abnormal; Notable for the following components:      Result Value   Glucose, Bld 135 (*)    Calcium  8.5 (*)    All other components within normal limits  CBC - Abnormal; Notable for the following components:   RBC 3.77 (*)    Hemoglobin 11.4 (*)    HCT 34.0 (*)    All other components within normal limits  HEPATIC FUNCTION PANEL - Abnormal; Notable for the following components:   AST 42 (*)    ALT 64 (*)    All other components within normal limits  SEDIMENTATION RATE - Abnormal; Notable for the following components:   Sed Rate 48 (*)    All other components within normal limits  D-DIMER, QUANTITATIVE  C-REACTIVE PROTEIN  TROPONIN I (HIGH SENSITIVITY)  TROPONIN I (HIGH SENSITIVITY)     MDM  Medtronic pacemaker interrogated.  Clinical Course as of 08/20/23 1515  Mon Aug 20, 2023  1312 Risk Wells criteria, D-dimer is negative have a low suspicion for pulmonary embolism.  Patient is on  Eliquis , no active shortness of breath at this time do not feel that CTA is necessary to further evaluate. [SM]    Clinical Course User Index [SM] Viviano Ground, MD   Reevaluation states that he had improvement of his blurry vision with his glasses on at this time.  Extract movements intact.  Normal peripheral vision.  Able to ambulate in the  emergency department without any difficulties.  No dysmetria normal finger-to-nose.  No room spinning dizziness.  Serial troponin negative, have low suspicion for ACS.  On reevaluation complaining of a pressure sensation to his head.  Will add on a CTA of his head and neck to further evaluate for causes of his headache.  Is on Eliquis  and have a low suspicion for dural venous thrombosis.  Will add on acute inflammatory markers to further risk ratified for giant cell arteritis.  PROCEDURES:  Critical Care performed: No  Procedures  Patient's presentation is most consistent with acute presentation with potential threat to life or bodily function.   MEDICATIONS ORDERED IN ED: Medications  tetracaine (PONTOCAINE) 0.5 % ophthalmic solution 1 drop (1 drop Right Eye Given 08/20/23 1250)  acetaminophen  (TYLENOL ) tablet 1,000 mg (1,000 mg Oral Given 08/20/23 1229)  sodium chloride  0.9 % bolus 500 mL (500 mLs Intravenous New Bag/Given 08/20/23 1441)  prochlorperazine  (COMPAZINE ) injection 10 mg (10 mg Intravenous Given 08/20/23 1442)  iohexol  (OMNIPAQUE ) 350 MG/ML injection 75 mL (75 mLs Intravenous Contrast Given 08/20/23 1449)    FINAL CLINICAL IMPRESSION(S) / ED DIAGNOSES   Final diagnoses:  Dizzy  Acute nonintractable headache, unspecified headache type     Rx / DC Orders   ED Discharge Orders     None        Note:  This document was prepared using Dragon voice recognition software and may include unintentional dictation errors.   Viviano Ground, MD 08/20/23 1515

## 2023-08-20 NOTE — Discharge Instructions (Signed)
 Use Tylenol  for pain and fevers.  Up to 1000 mg per dose, up to 4 times per day.  Do not take more than 4000 mg of Tylenol /acetaminophen  within 24 hours..  Use the Compazine  as needed in addition to the Tylenol  to help with your headaches.  Return to the ED with any worsening symptoms despite these measures

## 2023-08-20 NOTE — ED Provider Notes (Signed)
 Patient received in signout from Dr. Dodson Freestone pending CTA neck/head and inflammatory markers.  CTA without acute features, minimal atherosclerotic disease.  Inflammatory marker, ESR, is mildly elevated but less than 50.  Less likely giant cell arteritis/temporal arteritis.  He has resolution of his headache after Compazine , no vision changes and reports feeling well.  I considered admission for this patient but ultimately suspect he will be suitable for outpatient management and PCP follow-up.  Provided prescription for Compazine , discussed expectant management, ED return precautions.   Arline Bennett, MD 08/20/23 [phone]

## 2023-08-21 DIAGNOSIS — I483 Typical atrial flutter: Secondary | ICD-10-CM | POA: Diagnosis not present

## 2023-08-21 DIAGNOSIS — R001 Bradycardia, unspecified: Secondary | ICD-10-CM | POA: Diagnosis not present

## 2023-08-21 DIAGNOSIS — I442 Atrioventricular block, complete: Secondary | ICD-10-CM | POA: Diagnosis not present

## 2023-08-21 DIAGNOSIS — I214 Non-ST elevation (NSTEMI) myocardial infarction: Secondary | ICD-10-CM | POA: Diagnosis not present

## 2023-08-21 DIAGNOSIS — Z951 Presence of aortocoronary bypass graft: Secondary | ICD-10-CM | POA: Diagnosis not present

## 2023-08-21 DIAGNOSIS — Z950 Presence of cardiac pacemaker: Secondary | ICD-10-CM | POA: Diagnosis not present

## 2023-08-21 DIAGNOSIS — E782 Mixed hyperlipidemia: Secondary | ICD-10-CM | POA: Diagnosis not present

## 2023-08-21 DIAGNOSIS — N1831 Chronic kidney disease, stage 3a: Secondary | ICD-10-CM | POA: Diagnosis not present

## 2023-08-21 DIAGNOSIS — I10 Essential (primary) hypertension: Secondary | ICD-10-CM | POA: Diagnosis not present

## 2023-08-21 DIAGNOSIS — R002 Palpitations: Secondary | ICD-10-CM | POA: Diagnosis not present

## 2023-08-21 DIAGNOSIS — I2510 Atherosclerotic heart disease of native coronary artery without angina pectoris: Secondary | ICD-10-CM | POA: Diagnosis not present

## 2023-08-21 LAB — C-REACTIVE PROTEIN: CRP: 1.6 mg/dL — ABNORMAL HIGH (ref <1.0)

## 2023-08-22 ENCOUNTER — Encounter: Payer: Self-pay | Admitting: Family

## 2023-08-22 ENCOUNTER — Ambulatory Visit (INDEPENDENT_AMBULATORY_CARE_PROVIDER_SITE_OTHER): Admitting: Family

## 2023-08-22 VITALS — BP 138/68 | HR 87 | Ht 67.0 in | Wt 253.8 lb

## 2023-08-22 DIAGNOSIS — I10 Essential (primary) hypertension: Secondary | ICD-10-CM

## 2023-08-22 DIAGNOSIS — H6123 Impacted cerumen, bilateral: Secondary | ICD-10-CM | POA: Diagnosis not present

## 2023-08-22 DIAGNOSIS — R55 Syncope and collapse: Secondary | ICD-10-CM

## 2023-08-22 DIAGNOSIS — R42 Dizziness and giddiness: Secondary | ICD-10-CM

## 2023-08-22 NOTE — Progress Notes (Signed)
 Acute Office Visit  Subjective:     Patient ID: Edward Pitts, male    DOB: 13-Jun-1960, 63 y.o.   MRN: [id number]  Patient is in today for  Chief Complaint  Patient presents with   Follow-up    ED follow up    Went to [facility] on the day that he went to the ED, was walking around with [facility] and he got very short of breath and dizzy.  Went to the ED to have everything checked out, and they said everything was fine.   He would just like to know if we can find a reason for his dizziness.  He does not think that the ED looked in his ears when he went to see them.   No other concern today.     Review of Systems  All other systems reviewed and are negative.       Objective:     BP 138/68   Pulse 87   Ht 5' 7 (1.702 m)   Wt 253 lb 12.8 oz (115.1 kg)   SpO2 96%   BMI 39.75 kg/m   Physical Exam Vitals and nursing note reviewed.  Constitutional:      Appearance: Normal appearance. He is normal weight.  Eyes:     Pupils: Pupils are equal, round, and reactive to light.  Cardiovascular:     Rate and Rhythm: Normal rate and regular rhythm.     Pulses: Normal pulses.     Heart sounds: Normal heart sounds.  Pulmonary:     Effort: Pulmonary effort is normal.     Breath sounds: Normal breath sounds.  Neurological:     Mental Status: He is alert.  Psychiatric:        Mood and Affect: Mood normal.        Behavior: Behavior normal.    No results found for any visits on 08/22/23.  Recent Results (from the past 2160 hours)  Basic metabolic panel     Status: Abnormal   Collection Time: 08/20/23 10:46 AM  Result Value Ref Range   Sodium 141 135 - 145 mmol/L   Potassium 3.9 3.5 - 5.1 mmol/L   Chloride 108 98 - 111 mmol/L   CO2 26 22 - 32 mmol/L   Glucose, Bld 135 (H) 70 - 99 mg/dL    Comment: Glucose reference range applies only to samples taken after fasting for at least 8 hours.   BUN 17 8 - 23 mg/dL   Creatinine, Ser 8.91 0.61 - 1.24 mg/dL   Calcium  8.5 (L) 8.9 -  10.3 mg/dL   GFR, Estimated >39 >39 mL/min    Comment: (NOTE) Calculated using the CKD-EPI Creatinine Equation (2021)    Anion gap 7 5 - 15    Comment: Performed at [facility], [address] [place], [place] [address]  CBC     Status: Abnormal   Collection Time: 08/20/23 10:46 AM  Result Value Ref Range   WBC 4.3 4.0 - 10.5 K/uL   RBC 3.77 (L) 4.22 - 5.81 MIL/uL   Hemoglobin 11.4 (L) 13.0 - 17.0 g/dL   HCT 65.9 (L) 60.9 - 47.9 %   MCV 90.2 80.0 - 100.0 fL   MCH 30.2 26.0 - 34.0 pg   MCHC 33.5 30.0 - 36.0 g/dL   RDW 86.7 88.4 - 84.4 %   Platelets 157 150 - 400 K/uL   nRBC 0.0 0.0 - 0.2 %    Comment: Performed at [facility], [address] [place], [place] [address]  Troponin I (High Sensitivity)     Status: None   Collection Time: 08/20/23 10:46 AM  Result Value Ref Range   Troponin I (High Sensitivity) 5 <18 ng/L    Comment: (NOTE) Elevated high sensitivity troponin I (hsTnI) values and significant  changes across serial measurements may suggest ACS but many other  chronic and acute conditions are known to elevate hsTnI results.  Refer to the Links section for chest pain algorithms and additional  guidance. Performed at [facility], [address] [place], [place] [address]   Troponin I (High Sensitivity)     Status: None   Collection Time: 08/20/23 12:23 PM  Result Value Ref Range   Troponin I (High Sensitivity) 6 <18 ng/L    Comment: (NOTE) Elevated high sensitivity troponin I (hsTnI) values and significant  changes across serial measurements may suggest ACS but many other  chronic and acute conditions are known to elevate hsTnI results.  Refer to the Links section for chest pain algorithms and additional  guidance. Performed at [facility], [address] [place], [place] [address]   Hepatic function panel     Status: Abnormal   Collection Time: 08/20/23 12:23 PM  Result Value Ref Range   Total Protein 7.0 6.5  - 8.1 g/dL   Albumin  3.8 3.5 - 5.0 g/dL   AST 42 (H) 15 - 41 U/L   ALT 64 (H) 0 - 44 U/L   Alkaline Phosphatase 59 38 - 126 U/L   Total Bilirubin 0.6 0.0 - 1.2 mg/dL   Bilirubin, Direct <9.8 0.0 - 0.2 mg/dL   Indirect Bilirubin NOT CALCULATED 0.3 - 0.9 mg/dL    Comment: Performed at [facility], [address] [place], [place] [address]  D-dimer, quantitative     Status: None   Collection Time: 08/20/23 12:23 PM  Result Value Ref Range   D-Dimer, Quant 0.38 0.00 - 0.50 ug/mL-FEU    Comment: (NOTE) At the manufacturer cut-off value of 0.5 g/mL FEU, this assay has a negative predictive value of 95-100%.This assay is intended for use in conjunction with a clinical pretest probability (PTP) assessment model to exclude pulmonary embolism (PE) and deep venous thrombosis (DVT) in outpatients suspected of PE or DVT. Results should be correlated with clinical presentation. Performed at [facility], [address] [place], [place] [address]   Sedimentation rate     Status: Abnormal   Collection Time: 08/20/23  2:20 PM  Result Value Ref Range   Sed Rate 48 (H) 0 - 20 mm/hr    Comment: Performed at [facility], [address] [place], [place] [address]  C-reactive protein     Status: Abnormal   Collection Time: 08/20/23  2:20 PM  Result Value Ref Range   CRP 1.6 (H) <1.0 mg/dL    Comment: Performed at [facility], [address], [place], [place] [address]    Allergies as of 08/22/2023       Reactions   Hydrochlorothiazide  Other (See Comments)   Ibuprofen  Other (See Comments)   Affects kidneys   Levetiracetam  Hives, Rash, Dermatitis   Other reaction(s): Dizziness, Headache   Nsaids    Affects kidneys   Capecitabine  Rash, Dermatitis   Other Reaction(s): Eruption of skin        Medication List        Accurate as of Aug 22, 2023  7:56 PM. If you have any questions, ask your  nurse or doctor.          aspirin  EC 81 MG tablet Take  81 mg by mouth daily. Swallow whole.   atorvastatin  20 MG tablet Commonly known as: LIPITOR Take 20 mg by mouth daily.   Calcium -Vitamin D -Vitamin K 750-500-40 MG-UNT-MCG Tabs Take 1 tablet by mouth daily.   clonazePAM  0.5 MG tablet Commonly known as: KLONOPIN  TAKE 1 TABLET BY MOUTH IN THE MORNING AND 1 BY MOUTH IN THE EVENING PER PT   clotrimazole  1 % cream Commonly known as: Clotrimazole  Anti-Fungal Apply 1 Application topically 2 (two) times daily. Apply to the L inguinal fold What changed:  when to take this reasons to take this additional instructions   Eliquis  5 MG Tabs tablet Generic drug: apixaban  Take 5 mg by mouth 2 (two) times daily.   febuxostat  40 MG tablet Commonly known as: ULORIC  Take 40 mg by mouth daily.   hydrocortisone  cream 0.5 % Apply 1 Application topically.   Lacosamide  150 MG Tabs Take 1 tablet (150 mg total) by mouth in the morning and at bedtime.   lidocaine -prilocaine  cream Commonly known as: EMLA  Apply 1 Application topically as needed. Apply small amount of cream to port site approx 1-2 hours prior to appointment.   lisinopril  10 MG tablet Commonly known as: ZESTRIL  Take 10 mg by mouth daily.   metoprolol  succinate 25 MG 24 hr tablet Commonly known as: TOPROL -XL Take 25 mg by mouth daily.   miconazole 2 % powder Commonly known as: MICOTIN Apply topically as needed for itching.   multivitamin with minerals Tabs tablet Take 1 tablet by mouth daily. Centrum Silver    nystatin  powder Commonly known as: MYCOSTATIN /NYSTOP  Apply 1 Application topically 3 (three) times daily. What changed:  when to take this reasons to take this   omeprazole  20 MG capsule Commonly known as: PRILOSEC TAKE 1 CAPSULE BY MOUTH EVERY DAY   ondansetron  4 MG tablet Commonly known as: ZOFRAN  TAKE 1 TABLET BY MOUTH EVERY 8 HOURS AS NEEDED FOR NAUSEA AND VOMITING   prochlorperazine  10 MG tablet Commonly known as: COMPAZINE  Take 1 tablet (10 mg total)  by mouth every 6 (six) hours as needed for nausea or vomiting.   Systane Balance 0.6 % Soln Generic drug: Propylene Glycol Place 1 drop into both eyes as needed (dry eyes).   traZODone  50 MG tablet Commonly known as: DESYREL  Take 50 mg by mouth at bedtime as needed for sleep.            Assessment & Plan:   Problem List Items Addressed This Visit   None Visit Diagnoses       Bilateral impacted cerumen    -  Primary   Ears cleaned today, asked pt to use Debrox drops for the next three days, then come back to have flushed again.     Dizziness         Postural dizziness with presyncope          Dizziness resolved, Pt will let me know if symptoms recur.   Return Friday for ear wash, otherwise, as previously scheduled..  Total time spent: 20 minutes  ALAN CHRISTELLA ARRANT, FNP  08/22/2023   This document may have been prepared by [facility] Voice Recognition software and as such may include unintentional dictation errors.

## 2023-08-24 ENCOUNTER — Encounter: Admitting: Family

## 2023-08-28 DIAGNOSIS — C221 Intrahepatic bile duct carcinoma: Secondary | ICD-10-CM | POA: Diagnosis not present

## 2023-08-28 DIAGNOSIS — K760 Fatty (change of) liver, not elsewhere classified: Secondary | ICD-10-CM | POA: Diagnosis not present

## 2023-08-30 ENCOUNTER — Other Ambulatory Visit: Payer: Self-pay | Admitting: Internal Medicine

## 2023-08-31 ENCOUNTER — Ambulatory Visit: Admitting: Internal Medicine

## 2023-08-31 ENCOUNTER — Encounter

## 2023-09-03 ENCOUNTER — Encounter: Payer: Self-pay | Admitting: Oncology

## 2023-09-04 DIAGNOSIS — H1045 Other chronic allergic conjunctivitis: Secondary | ICD-10-CM | POA: Diagnosis not present

## 2023-09-04 DIAGNOSIS — H2513 Age-related nuclear cataract, bilateral: Secondary | ICD-10-CM | POA: Diagnosis not present

## 2023-09-04 DIAGNOSIS — H40013 Open angle with borderline findings, low risk, bilateral: Secondary | ICD-10-CM | POA: Diagnosis not present

## 2023-09-04 DIAGNOSIS — D3132 Benign neoplasm of left choroid: Secondary | ICD-10-CM | POA: Diagnosis not present

## 2023-09-07 ENCOUNTER — Inpatient Hospital Stay: Payer: 59 | Attending: Oncology | Admitting: Oncology

## 2023-09-07 ENCOUNTER — Encounter: Payer: Self-pay | Admitting: Oncology

## 2023-09-07 VITALS — BP 132/61 | HR 85 | Temp 98.1°F | Resp 18 | Wt 251.3 lb

## 2023-09-07 DIAGNOSIS — C221 Intrahepatic bile duct carcinoma: Secondary | ICD-10-CM | POA: Diagnosis not present

## 2023-09-07 DIAGNOSIS — Z08 Encounter for follow-up examination after completed treatment for malignant neoplasm: Secondary | ICD-10-CM | POA: Insufficient documentation

## 2023-09-07 DIAGNOSIS — N1832 Chronic kidney disease, stage 3b: Secondary | ICD-10-CM | POA: Diagnosis not present

## 2023-09-07 DIAGNOSIS — Z8505 Personal history of malignant neoplasm of liver: Secondary | ICD-10-CM | POA: Diagnosis not present

## 2023-09-07 DIAGNOSIS — N1831 Chronic kidney disease, stage 3a: Secondary | ICD-10-CM

## 2023-09-07 DIAGNOSIS — C438 Malignant melanoma of overlapping sites of skin: Secondary | ICD-10-CM | POA: Diagnosis not present

## 2023-09-07 DIAGNOSIS — Z85820 Personal history of malignant melanoma of skin: Secondary | ICD-10-CM | POA: Diagnosis not present

## 2023-09-07 DIAGNOSIS — D631 Anemia in chronic kidney disease: Secondary | ICD-10-CM | POA: Diagnosis not present

## 2023-09-07 DIAGNOSIS — Z95828 Presence of other vascular implants and grafts: Secondary | ICD-10-CM

## 2023-09-07 NOTE — Assessment & Plan Note (Signed)
 Encourage oral hydration and avoid nephrotoxins.

## 2023-09-07 NOTE — Assessment & Plan Note (Signed)
 Port flush every 6 to 8 weeks.

## 2023-09-07 NOTE — Assessment & Plan Note (Signed)
 Patient has no radiographic evidence of metastatic melanoma on recent CT scan done at [facility] immunotherapy. Continue surveillance

## 2023-09-07 NOTE — Assessment & Plan Note (Addendum)
 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  S/p chemotherapy Xeloda  825mg /m2 BID+ concurrent RT Previously on adjuvant Xeloda  [1000 mg/m2 BID for 14 of every 21 days] -dose reduced to 825mg /m2 BID due to skin rash, still not able to tolerate- discontinue.--> s/p 6 cycles gemcitabine  weekly, 2 weeks on, 1 week off, [adjustment due to cytopenia] He is doing very well  Labs are reviewed and discussed with patient.  June 2025  surveillance CT scan  at [facility]- no recurrence. Tumor markers stable.  Continue surveillance.

## 2023-09-07 NOTE — Progress Notes (Signed)
 Hematology/Oncology Progress note [phone] [phone]    CHIEF COMPLAINTS/REASON FOR VISIT:  Follow up for melanoma, cholangiocarcinoma.    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  Recommend patient to resume oral iron  supplementation once daily.   Malignant melanoma of overlapping sites [facility]) Patient has no radiographic evidence of metastatic melanoma on recent CT scan done at [facility] immunotherapy. Continue surveillance  Port-A-Cath in place Port flush every 6 to 8 weeks.  Stage 3a chronic kidney disease (HCC) Encourage oral hydration and avoid nephrotoxins.      Cholangiocarcinoma (HCC) T1a Nx Cholangiocarcinoma Pathology was reviewed and discussed with patient.  S/p chemotherapy Xeloda  825mg /m2 BID+ concurrent RT Previously on adjuvant Xeloda  [1000 mg/m2 BID for 14 of every 21 days] -dose reduced to 825mg /m2 BID due to skin rash, still not able to tolerate- discontinue.--> s/p 6 cycles gemcitabine  weekly, 2 weeks on, 1 week off, [adjustment due to cytopenia] He is doing very well  Labs are reviewed and discussed with patient.  June 2025  surveillance CT scan  at [facility]- no recurrence. Tumor markers stable.  Continue surveillance.  Follow up in 4 months.  All questions were answered. The patient knows to call the clinic with any problems, questions or concerns.  Timmy Forbes, MD, PhD [facility] Hematology Oncology 09/07/2023    HISTORY OF PRESENTING  ILLNESS:   Edward Pitts is a  63 y.o.  male presents for recurrent malignant melanoma.   Oncology History  Malignant melanoma of overlapping sites [facility])  04/16/2019 Cancer Staging   Staging form: Melanoma of the Skin, AJCC 8th Edition - Pathologic: Stage Unknown (rpTX, pN1b, cM0) - Signed by Timmy Forbes, MD on 07/27/2020 Stage prefix: Recurrence    04/23/2019 Initial Diagnosis   Malignant melanoma   -He has a history of left lower extremity melanoma in 2011, status post local excision -04/16/2019 patient underwent left groin mass resection  Resection pathology showed malignant melanoma, replacing a lymph node, with extracapsular extension, peripheral and deep margins involved.  Left inguinal contents, all 7 lymph nodes were negative for melanoma in the lymph nodes. Extranodal melanoma identified in lymphatic and interstitium between nodes -PDL1 80% TPS    07/07/2019 -  Radiation Therapy   status post adjuvant radiation.   07/23/2019 - 07/21/2021 Chemotherapy   Nivolumab  q14d      06/29/2020 Imaging   CT chest abdomen pelvis showed stable postoperative appearance of the left groin.  No evidence of local recurrence.  No evidence of metastatic disease in the chest abdomen or pelvis.  Hepatic steatosis.  Stable subcentimeter fluid attenuation lesion of the lateral right lobe of the liver, likely benign cyst or hemangioma.  Coronary artery disease.  Aortic atherosclerosis   10/20/2020 Imaging   CT chest abdomen pelvis showed stable postoperative/radiation appearance of the left groin.  No evidence of local recurrence/metastatic disease within the chest abdomen/pelvis.  Fatty liver disease.  Diverticulosis without evidence of typhlitis.  Aortic atherosclerosis   03/10/2021 Imaging   MRI brain without contrast showed no  definitive evidence of intracranial metastatic disease.  Study is limited by absence of intravenous contrast.     04/26/2021 Imaging   CT chest abdomen pelvis without contrast showed  stable post operative changes of left groin with no evidence of recurrent disease.  No evidence of metastatic disease in the chest abdomen pelvis.  Aortic atherosclerosis   08/08/2021 - 08/16/2021 Hospital Admission    patient was hospitalized due to NSTEMI status post CABG x3.  He also had pacemaker The echocardiogram showed left ventricular ejection fraction of 65 to 70%,   09/15/2021 Imaging   CT chest abdomen pelvis w contrast  IMPRESSION: 1. Subtle hypodense 9 mm lesion in the left lobe of the liver is new from prior imaging including previous contrasted CT dating back to December 10, 2019, with the lesion appearing to equilibrate with background liver on delayed imaging sequence but is incompletely evaluated on this imaging study and technically nonspecific possibly reflecting a benign perfusional variant and while its appearance is not typical for that of a melanoma metastasis, it is not excluded on this examination. Suggest more definitive characterization by hepatic protocol MRI with and without contrast. 2. Stable postoperative changes in the left groin without evidence of local recurrent disease.3. No evidence of metastatic disease in the chest or pelvis.4.  Aortic Atherosclerosis (ICD10-I70.0).      09/23/2021 Imaging   Contrast-enhanced liver ultrasound Mildly hypoenhancing 2.2 cm mass in the posterior aspect of the left lobe of the liver with washout characteristics concerning for non hepatocellular malignancy, concerning for melanotic metastasis given history. The lesion is in an unfavorable location for percutaneous biopsy. Consider PET-CT for further characterization   10/21/2021 -  Chemotherapy   Nivolumab  q14d      11/10/2021 Imaging   MRI Brain w wo contrast Negative for metastatic disease to the brain   11/10/2021 Imaging   MRI abdomen w wo contrast 1. Lesion of the posterior superior left lobe of the liver, hepatic segment II, measuring 1.9 x 1.8 cm corresponding to  findings of prior imaging. Evaluation is somewhat limited breath motion artifact however there is subtle associated rim enhancement of this lesion. Findings are most in keeping with a hepatic metastasis in the setting of known recurrent melanoma. 2. Mild hepatic steatosis. 3. Cardiomegaly.    02/27/2022 - 02/27/2022 Chemotherapy   Patient is on Treatment Plan : Capecitabine  (825 mg/m2 bid) + XRT     07/12/2022 -  Chemotherapy   Patient is on Treatment Plan : PANCREAS Gemcitabine  D1,8(1000) q21d     Cholangiocarcinoma (HCC)  12/19/2021 Initial Diagnosis   Liver biopsy showed carcinoma  -The slides on the patient's prior left inguinal lymph node biopsy ([id number]) with metastatic melanoma were reviewed in conjunction with  this case. The morphology of the malignant cells between the two cases are dissimilar. A limited panel of immunohistochemical stains was performed and the neoplastic cells are positive for superpancytokeratin, cytokeratin 7 (diffuse, strong), cytokeratin 20 (patchy, moderate) and negative for S100, SOX-10, and HepPar-1.These findings are consistent with carcinoma. The morphologic findings and pattern of immunohistochemical staining are non-specific and possible sites of origin include but are not limited to pancreaticobiliary tract, GI tract, prostate, kidney, lung, and breast. Per [facility], the patient had a PET scan performed in July 2023 which demonstrated a single hepatic  hypermetabolic lesion without other sites of disease.    01/19/2022 Surgery   Liver lesion resection at [facility] by Dr.Zani  A.  Liver, segment 2, 3, 4A, partial hepatectomy:  Cholangiocarcinoma, moderately differentiated (2.5 cm, segment 2).  Tumor extends to hepatic parenchymal margin, but all other margins are negative for tumor. See synoptic report and comment.   Background liver with: Mild steatosis (20%).  No definitive evidence of steatohepatitis.   Mild periportal fibrosis (trichrome and reticulin). No  stainable iron  (Prussian blue stain).  No evidence to support alpha-1-antitrypsin deficiency on PAS-D stain.   pT1a pNx   01/25/2022 Cancer Staging   Staging form: Intrahepatic Bile Duct, AJCC 8th Edition - Pathologic stage from 01/25/2022: Stage Unknown (pT1a, pNX, cM0) - Signed by Timmy Forbes, MD on 02/21/2022 Stage prefix: Initial diagnosis   03/23/2022 - 05/01/2022 Radiation Therapy   Concurrent Xeloda  825mg /m2 BID + Radiation.    05/08/2022 -  Chemotherapy   Xeloda  [1000 mg/m2 BID for 14 of every 21 days], plan 4 months.   05/08/2022, cycle 1 Xeloda .  Xeloda  was stopped on 05/19/22 due to developing rash on face as well as dorsum of hands. Treated with steroid,symptoms resolved.  3/4-3/13/2024  Xeloda  was stopped early due to groin cellulitis, he also developed similar skin rash again  Plan 3/25 cycle 3 dose reduce Xeloda  825 mg/m2   2000mg  BID x 14 days.   05/09/2022 Imaging   CT chest abdomen pelvis with contrast at [facility] showed Status post partial left hepatectomy without CT evidence of recurrent or metastatic disease in the chest, abdomen, pelvis.     07/24/2022 - 07/26/2022 Hospital Admission   Patient was hospitalized due to symptomatic anemia, status post 1 unit of PRBC transfusion. Also neutropenia secondary to chemotherapy.  Nadir was 0.9.  Patient was afebrile.   09/05/2022 Imaging   CT chest abdomen pelvis with contrast at [facility]  Postsurgical changes of left hepatectomy without evidence of recurrent or  metastatic disease.    01/02/2023 Imaging   CT chest abdomen pelvis w contrast [facility] No evidence of recurrent or metastatic disease.    05/01/2023 Imaging   CT chest abdomen pelvis w contrast at [facility] showed  Impression:  1. No findings of locally recurrent or metastatic disease within the  chest, abdomen or pelvis.  2.  Diffuse hepatic steatosis.     05/01/2023 Tumor Marker   CA 19.9   8 CEA 0.3    04/18/2021, patient establish care with neurology Dr. Mark Sil for  intermittent altered cognition.  he was recommended to start Vimpat .    INTERVAL HISTORY Edward Pitts is a 63 y.o. male who has above history reviewed by me today presents for follow up visit for management of inguinal nodal recurrence of melanoma, resected T1a cholangiocarcinoma. He reports feeling well,  No abdominal pain, weight loss, nausea, emesis, or jaundice.  No new complaints. Patient has had CT scanning and a blood work done  at [facility].  :Review of Systems  Constitutional:  Negative for appetite change, chills, fatigue, fever and unexpected weight change.  HENT:   Negative for hearing loss and voice change.   Eyes:  Negative for eye problems and icterus.  Respiratory:  Negative for chest tightness, cough and shortness of breath.   Cardiovascular:  Positive for leg swelling. Negative for chest pain.  Gastrointestinal:  Negative for abdominal distention and abdominal pain.  Endocrine: Negative for hot flashes.  Genitourinary:  Negative for difficulty urinating, dysuria and frequency.   Musculoskeletal:        Status post left hip replacement  Skin:  Negative for itching.       Skin hypo-pigmentation on upper extremities, no change  Neurological:  Negative for light-headedness and numbness.  Hematological:  Negative for adenopathy. Does not bruise/bleed easily.  Psychiatric/Behavioral:  Negative for confusion.     MEDICAL HISTORY:  Past Medical History:  Diagnosis Date   AKI (acute kidney injury) (HCC) 08/24/2018   Anemia    iron  treatments   Anxiety    Aortic atherosclerosis (HCC)    Arthritis    Cancer of groin (HCC) 2021   left groin, resected, radiation   Cataract    Complication of anesthesia    PONV   Coronary artery disease    Dizziness 12/22/2019   Dizziness of unknown etiology    has led to seizures and passing out.   Elevated serum creatinine 04/15/2021   Exposure to combat 04/20/2020   Exposure to potentially hazardous substance 09/18/2022   Sep 11, 2022 Entered By: Lourdes Roy Comment: Entered automatically through [facility] Problem List documentation program     Family history of adverse reaction to anesthesia    PONV mother   GERD (gastroesophageal reflux disease)    H/O Malignant melanoma 09/18/2022   History of complete heart block    PPM placed   Hyperlipidemia    Hypertension    LBBB (left bundle branch block)    Lymphedema of left leg    uses thigh high compression stockings   Melanoma (HCC) 2012   skin cancer, left thigh   OSA on CPAP    PONV (postoperative nausea and vomiting) 04/16/2019   Port-A-Cath in place    RIGHT chest wall   Presence of cardiac pacemaker    [employer]   Problem related to unspecified psychosocial circumstances 09/18/2022   Seizures (HCC)    still has episodes of dizziness. last event 1 month ago (march 2022) and will pass out. takes clonazepam     SURGICAL HISTORY: Past Surgical History:  Procedure Laterality Date   CARDIOVERSION N/A 05/15/2023   Procedure: CARDIOVERSION;  Surgeon: Percival Brace, MD;  Location: [facility] ORS;  Service: Cardiovascular;  Laterality: N/A;   CORONARY ARTERY BYPASS GRAFT N/A 08/10/2021   Procedure: CORONARY ARTERY BYPASS GRAFTING (CABG) X 3 USING LEFT INTERNAL MAMMARY ARTERY AND RIGHT GREATER SAPHENOUS VEIN;  Surgeon: Shon Downing, MD;  Location: [facility] OR;  Service: Open Heart Surgery;  Laterality: N/A;   CT RADIATION THERAPY GUIDE     left groin   dental implant     permanent implant   ENDOVEIN HARVEST OF GREATER SAPHENOUS VEIN Right 08/10/2021   Procedure: ENDOVEIN HARVEST OF GREATER SAPHENOUS VEIN;  Surgeon: Shon Downing, MD;  Location: [facility] OR;  Service: Open Heart Surgery;  Laterality: Right;   KNEE SURGERY Left    arthroscopy   LEFT HEART CATH AND CORONARY ANGIOGRAPHY Left 06/29/2017   Procedure: LEFT HEART CATH AND CORONARY ANGIOGRAPHY;  Surgeon: Michelle Aid, MD;  Location: [facility] INVASIVE CV LAB;  Service: Cardiovascular;  Laterality: Left;   LEFT HEART  CATH AND CORONARY ANGIOGRAPHY N/A 08/08/2021   Procedure: LEFT HEART CATH AND CORONARY ANGIOGRAPHY;  Surgeon: Michelle Aid, MD;  Location: [facility] INVASIVE CV LAB;  Service: Cardiovascular;  Laterality: N/A;   LYMPH NODE DISSECTION Left 04/16/2019   Procedure: Left inguinal Lymph Node Dissection;  Surgeon: Lockie Rima, MD;  Location: [facility] OR;  Service: General;  Laterality: Left;   MELANOMA EXCISION Left 04/16/2019   Procedure: MELANOMA EXCISION LEFT GROIN MASS;  Surgeon: Lockie Rima, MD;  Location: [facility] OR;  Service: General;  Laterality: Left;   MELANOMA EXCISION WITH SENTINEL LYMPH NODE BIOPSY Left 2012  Left calf    PACEMAKER INSERTION N/A 08/26/2018   Procedure: INSERTION PACEMAKER;  Surgeon: Percival Brace, MD;  Location: [facility] ORS;  Service: Cardiovascular;  Laterality: N/A;   PORTA CATH INSERTION N/A 08/26/2019   Procedure: PORTA CATH INSERTION;  Surgeon: Jackquelyn Mass, MD;  Location: [facility] INVASIVE CV LAB;  Service: Cardiovascular;  Laterality: N/A;   SUPERFICIAL LYMPH NODE BIOPSY / EXCISION Left 2020   lymph nodes removed around left groin melanoma site   TEE WITHOUT CARDIOVERSION N/A 08/10/2021   Procedure: TRANSESOPHAGEAL ECHOCARDIOGRAM (TEE);  Surgeon: Shon Downing, MD;  Location: [facility] OR;  Service: Open Heart Surgery;  Laterality: N/A;   TEMPORARY PACEMAKER N/A 08/25/2018   Procedure: TEMPORARY PACEMAKER;  Surgeon: Arnoldo Lapping, MD;  Location: [facility] INVASIVE CV LAB;  Service: Cardiovascular;  Laterality: N/A;   TOTAL HIP ARTHROPLASTY Left 07/14/2020   Procedure: TOTAL HIP ARTHROPLASTY;  Surgeon: Arlyne Lame, MD;  Location: [facility] ORS;  Service: Orthopedics;  Laterality: Left;    SOCIAL HISTORY: Social History   Socioeconomic History   Marital status: Married    Spouse name: Antony Baumgartner    Number of children: 7   Years of education: 12   Highest education level: Not on file  Occupational History    Comment: disability  Tobacco Use   Smoking status: Never   Smokeless  tobacco: Never  Vaping Use   Vaping status: Never Used  Substance and Sexual Activity   Alcohol  use: No   Drug use: No   Sexual activity: Not Currently  Other Topics Concern   Not on file  Social History Narrative   Lives with  Wife,   Has 2 small dogs   Caffeine use: sodas (2 per day)      Out of work on disability.  Has a walk in shower. No stairs to climb   Oncology treatment ongoing. Uses port a cath for treatment.      pacemaker   Social Drivers of Health   Financial Resource Strain: Low Risk  (02/12/2019)   Overall Financial Resource Strain (CARDIA)    Difficulty of Paying Living Expenses: Not hard at all  Food Insecurity: No Food Insecurity (03/15/2023)   Hunger Vital Sign    Worried About Running Out of Food in the Last Year: Never true    Ran Out of Food in the Last Year: Never true  Transportation Needs: No Transportation Needs (03/15/2023)   PRAPARE - Administrator, Civil Service (Medical): No    Lack of Transportation (Non-Medical): No  Physical Activity: Inactive (03/15/2023)   Exercise Vital Sign    Days of Exercise per Week: 0 days    Minutes of Exercise per Session: 0 min  Stress: No Stress Concern Present (03/15/2023)   [employer] of Occupational Health - Occupational Stress Questionnaire    Feeling of Stress : Not at all  Social Connections: Unknown (02/12/2019)   Social Connection and Isolation Panel    Frequency of Communication with Friends and Family: More than three times a week    Frequency of Social Gatherings with Friends and Family: Not on file    Attends Religious Services: Not on file    Active Member of Clubs or Organizations: Not on file    Attends Banker Meetings: Not on file    Marital Status: Married  Intimate Partner Violence: Not At Risk (03/15/2023)   Humiliation, Afraid, Rape, and Kick questionnaire    Fear of Current or Ex-Partner: No  Emotionally Abused: No    Physically Abused: No     Sexually Abused: No    FAMILY HISTORY: Family History  Problem Relation Age of Onset   Cancer Paternal Grandmother     ALLERGIES:  is allergic to hydrochlorothiazide , ibuprofen , levetiracetam , nsaids, and capecitabine .  MEDICATIONS:  Current Outpatient Medications  Medication Sig Dispense Refill   apixaban  (ELIQUIS ) 5 MG TABS tablet Take 5 mg by mouth 2 (two) times daily.     aspirin  EC 81 MG tablet Take 81 mg by mouth daily. Swallow whole.     atorvastatin  (LIPITOR) 20 MG tablet Take 20 mg by mouth daily.     Calcium -Vitamin D -Vitamin K 750-500-40 MG-UNT-MCG TABS Take 1 tablet by mouth daily.     clonazePAM  (KLONOPIN ) 0.5 MG tablet TAKE 1 TABLET BY MOUTH IN THE MORNING AND 1 BY MOUTH IN THE EVENING PER PT 120 tablet 1   clotrimazole  (CLOTRIMAZOLE  ANTI-FUNGAL) 1 % cream Apply 1 Application topically 2 (two) times daily. Apply to the L inguinal fold 30 g 0   febuxostat  (ULORIC ) 40 MG tablet Take 40 mg by mouth daily.     hydrocortisone  cream 0.5 % Apply 1 Application topically.     Lacosamide  150 MG TABS TAKE 1 TABLET (150 MG TOTAL) BY MOUTH IN THE MORNING AND AT BEDTIME. 60 tablet 2   lidocaine -prilocaine  (EMLA ) cream Apply 1 Application topically as needed. Apply small amount of cream to port site approx 1-2 hours prior to appointment. 30 g 11   lisinopril  (ZESTRIL ) 10 MG tablet Take 10 mg by mouth daily.     metoprolol  succinate (TOPROL -XL) 25 MG 24 hr tablet Take 25 mg by mouth daily.     miconazole (MICOTIN) 2 % powder Apply topically as needed for itching.     Multiple Vitamin (MULTIVITAMIN WITH MINERALS) TABS tablet Take 1 tablet by mouth daily. Centrum Silver      nystatin  (MYCOSTATIN /NYSTOP ) powder Apply 1 Application topically 3 (three) times daily. (Patient taking differently: Apply 1 Application topically 3 (three) times daily as needed (irritation).) 60 g 1   omeprazole  (PRILOSEC) 20 MG capsule TAKE 1 CAPSULE BY MOUTH EVERY DAY 90 capsule 0   ondansetron  (ZOFRAN ) 4 MG tablet  TAKE 1 TABLET BY MOUTH EVERY 8 HOURS AS NEEDED FOR NAUSEA AND VOMITING 90 tablet 1   prochlorperazine  (COMPAZINE ) 10 MG tablet Take 1 tablet (10 mg total) by mouth every 6 (six) hours as needed for nausea or vomiting. 30 tablet 0   Propylene Glycol (SYSTANE BALANCE) 0.6 % SOLN Place 1 drop into both eyes as needed (dry eyes).     traZODone  (DESYREL ) 50 MG tablet Take 50 mg by mouth at bedtime as needed for sleep.     No current facility-administered medications for this visit.   Facility-Administered Medications Ordered in Other Visits  Medication Dose Route Frequency Provider Last Rate Last Admin   heparin  lock flush 100 UNIT/ML injection            heparin  lock flush 100 UNIT/ML injection            heparin  lock flush 100 unit/mL  500 Units Intravenous Once Timmy Forbes, MD       sodium chloride  flush (NS) 0.9 % injection 10 mL  10 mL Intravenous PRN Timmy Forbes, MD         PHYSICAL EXAMINATION: ECOG PERFORMANCE STATUS: 1 - Symptomatic but completely ambulatory Vitals:   09/07/23 0959  BP: 132/61  Pulse: 85  Resp: 18  Temp: 98.1  F (36.7 C)  SpO2: 94%    Filed Weights   09/07/23 0959  Weight: 251 lb 4.8 oz (114 kg)     Physical Exam Constitutional:      General: He is not in acute distress.    Appearance: He is obese.     Comments: Patient ambulates independently  HENT:     Head: Normocephalic and atraumatic.   Eyes:     General: No scleral icterus.    Pupils: Pupils are equal, round, and reactive to light.    Cardiovascular:     Rate and Rhythm: Normal rate and regular rhythm.  Pulmonary:     Effort: Pulmonary effort is normal. No respiratory distress.     Breath sounds: No wheezing.  Abdominal:     General: Bowel sounds are normal. There is no distension.     Palpations: Abdomen is soft. There is no mass.     Tenderness: There is no abdominal tenderness.     Comments:     Musculoskeletal:        General: No deformity. Normal range of motion.     Cervical  back: Normal range of motion and neck supple.     Comments: Left lower extremity edema-chronic    Skin:    General: Skin is warm and dry.   Neurological:     Mental Status: He is alert and oriented to person, place, and time. Mental status is at baseline.   Psychiatric:        Mood and Affect: Mood normal.     LABORATORY DATA:  I have reviewed the data as listed    Latest Ref Rng & Units 08/20/2023   10:46 AM 03/15/2023   10:55 AM 11/08/2022    8:48 AM  CBC  WBC 4.0 - 10.5 K/uL 4.3  7.8  1.8   Hemoglobin 13.0 - 17.0 g/dL 16.1  09.6  04.5   Hematocrit 39.0 - 52.0 % 34.0  38.9  30.2   Platelets 150 - 400 K/uL 157  194  206       Latest Ref Rng & Units 08/20/2023   12:23 PM 08/20/2023   10:46 AM 03/15/2023   10:55 AM  CMP  Glucose 70 - 99 mg/dL  409  811   BUN 8 - 23 mg/dL  17  21   Creatinine 9.14 - 1.24 mg/dL  7.82  9.56   Sodium 213 - 145 mmol/L  141  141   Potassium 3.5 - 5.1 mmol/L  3.9  5.0   Chloride 98 - 111 mmol/L  108  106   CO2 22 - 32 mmol/L  26  21   Calcium  8.9 - 10.3 mg/dL  8.5  08.6   Total Protein 6.5 - 8.1 g/dL 7.0   7.3   Total Bilirubin 0.0 - 1.2 mg/dL 0.6   0.4   Alkaline Phos 38 - 126 U/L 59   98   AST 15 - 41 U/L 42   42   ALT 0 - 44 U/L 64   63      RADIOGRAPHIC STUDIES: I have personally reviewed the radiological images as listed and agreed with the findings in the report. CT Angio Head Neck W WO CM Result Date: 08/20/2023 EXAM: CTA HEAD AND NECK WITHOUT AND WITH 08/20/2023 03:01:57 PM TECHNIQUE: CTA of the head and neck was performed without and with the administration of intravenous contrast. Multiplanar 2D and/or 3D reformatted images are provided for review. Automated exposure control, iterative  reconstruction, and/or weight based adjustment of the mA/kV was utilized to reduce the radiation dose to as low as reasonably achievable. Stenosis of the internal carotid arteries measured using NASCET criteria. COMPARISON: CT head without contrast  08/20/2023. MR head without and with contrast 11/12/2021. CLINICAL HISTORY: Vertigo, central; Vision loss, binocular. FINDINGS: CTA NECK: AORTIC ARCH AND ARCH VESSELS: Minimal atherosclerotic changes are present in the distal aortic arch and at the origin of the left subclavian artery without significant stenosis. No dissection or arterial injury. CAROTID ARTERIES: Mild atherosclerotic changes are present at the carotid bifurcations bilaterally, left greater than right, without significant stenosis. No dissection or arterial injury. VERTEBRAL ARTERIES: No dissection, arterial injury, or significant stenosis. SOFT TISSUES: The lung apices are clear. No cervical or superior mediastinal lymphadenopathy. The larynx and pharynx are unremarkable. No acute abnormality of the salivary and thyroid  glands. BONES: No acute osseous abnormality. CTA HEAD: ANTERIOR CIRCULATION: No significant stenosis of the internal carotid arteries. No significant stenosis of the anterior cerebral arteries. No significant stenosis of the middle cerebral arteries. No aneurysm. POSTERIOR CIRCULATION: No significant stenosis of the posterior cerebral arteries. No significant stenosis of the basilar artery. No significant stenosis of the vertebral arteries. No aneurysm. OTHER: No dural venous sinus thrombosis on this non-dedicated study. BRAIN: There is no acute infarct or acute intracranial hemorrhage. No mass effect or midline shift. IMPRESSION: 1. No large vessel occlusion in the head or neck. 2. Mild atherosclerotic changes at the carotid bifurcations bilaterally, left greater than right, without significant stenosis. 3. Minimal atherosclerotic changes in the distal aortic arch and at the origin of the left subclavian artery without significant stenosis. Electronically signed by: Audree Leas MD 08/20/2023 03:29 PM EDT RP Workstation: [id number]   CT Head Wo Contrast Result Date: 08/20/2023 CLINICAL DATA:  Headache. EXAM: CT HEAD  WITHOUT CONTRAST TECHNIQUE: Contiguous axial images were obtained from the base of the skull through the vertex without intravenous contrast. RADIATION DOSE REDUCTION: This exam was performed according to the departmental dose-optimization program which includes automated exposure control, adjustment of the mA and/or kV according to patient size and/or use of iterative reconstruction technique. COMPARISON:  MRI brain 11/10/2021.  Head CT 12/04/2017 FINDINGS: Brain: There is no evidence for acute hemorrhage, hydrocephalus, mass lesion, or abnormal extra-axial fluid collection. No definite CT evidence for acute infarction. Vascular: No hyperdense vessel or unexpected calcification. Skull: No evidence for fracture. No worrisome lytic or sclerotic lesion. Sinuses/Orbits: The visualized paranasal sinuses and mastoid air cells are clear. Visualized portions of the globes and intraorbital fat are unremarkable. Other: None. IMPRESSION: No acute intracranial abnormality. Electronically Signed   By: Donnal Fusi M.D.   On: 08/20/2023 13:30   DG Chest 2 View Result Date: 08/20/2023 CLINICAL DATA:  Shortness of breath. EXAM: CHEST - 2 VIEW COMPARISON:  07/24/2022 FINDINGS: The heart size and mediastinal contours are within normal limits. 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 Both lungs are clear. The visualized skeletal structures are unremarkable. IMPRESSION: No active cardiopulmonary disease. Electronically Signed   By: Marlyce Sine M.D.   On: 08/20/2023 11:11

## 2023-09-07 NOTE — Assessment & Plan Note (Addendum)
 anemia due to CKD slight decrease of hemoglobin comparing to his baseline.  Recommend patient to resume oral iron  supplementation once daily.

## 2023-09-08 ENCOUNTER — Other Ambulatory Visit: Payer: Self-pay

## 2023-09-10 ENCOUNTER — Other Ambulatory Visit: Payer: Self-pay

## 2023-09-14 ENCOUNTER — Encounter: Payer: Self-pay | Admitting: Internal Medicine

## 2023-09-14 ENCOUNTER — Encounter

## 2023-09-14 ENCOUNTER — Inpatient Hospital Stay (HOSPITAL_BASED_OUTPATIENT_CLINIC_OR_DEPARTMENT_OTHER): Admitting: Internal Medicine

## 2023-09-14 VITALS — BP 140/58 | HR 74 | Temp 99.0°F | Resp 17 | Wt 248.0 lb

## 2023-09-14 DIAGNOSIS — Z85820 Personal history of malignant melanoma of skin: Secondary | ICD-10-CM | POA: Diagnosis not present

## 2023-09-14 DIAGNOSIS — G40909 Epilepsy, unspecified, not intractable, without status epilepticus: Secondary | ICD-10-CM

## 2023-09-14 DIAGNOSIS — Z8505 Personal history of malignant neoplasm of liver: Secondary | ICD-10-CM | POA: Diagnosis not present

## 2023-09-14 DIAGNOSIS — Z08 Encounter for follow-up examination after completed treatment for malignant neoplasm: Secondary | ICD-10-CM | POA: Diagnosis not present

## 2023-09-14 MED ORDER — CLONAZEPAM 0.5 MG PO TABS
ORAL_TABLET | ORAL | 1 refills | Status: DC
Start: 2023-09-14 — End: 2023-12-21

## 2023-09-14 MED ORDER — LACOSAMIDE 200 MG PO TABS: 200.0000 mg | ORAL_TABLET | Freq: Two times a day (BID) | ORAL | 2 refills | Status: DC

## 2023-09-14 MED ORDER — NAYZILAM 5 MG/0.1ML NA SOLN: 5.0000 mg | Freq: Three times a day (TID) | NASAL | 0 refills | Status: AC | PRN

## 2023-09-14 NOTE — Progress Notes (Signed)
 [facility] at [facility] [address]  [place], [place] [address] [phone]   Interval Evaluation  Date of Service: 09/14/23 Patient Name: Edward Pitts Patient MRN: [id number] Patient DOB: 30-Jun-1960 Provider: Mamie Searles, MD  Identifying Statement:  Edward Pitts is a 63 y.o. male with suspected seizures  Primary Cancer:  Oncologic History: Oncology History  Malignant melanoma of overlapping sites [facility])  04/16/2019 Cancer Staging   Staging form: Melanoma of the Skin, AJCC 8th Edition - Pathologic: Stage Unknown (rpTX, pN1b, cM0) - Signed by Timmy Forbes, MD on 07/27/2020 Stage prefix: Recurrence    04/23/2019 Initial Diagnosis   Malignant melanoma   -Edward Pitts has a history of left lower extremity melanoma in 2011, status post local excision -04/16/2019 patient underwent left groin mass resection  Resection pathology showed malignant melanoma, replacing a lymph node, with extracapsular extension, peripheral and deep margins involved.  Left inguinal contents, all 7 lymph nodes were negative for melanoma in the lymph nodes. Extranodal melanoma identified in lymphatic and interstitium between nodes -PDL1 80% TPS    07/07/2019 -  Radiation Therapy   status post adjuvant radiation.   07/23/2019 - 07/21/2021 Chemotherapy   Nivolumab  q14d      06/29/2020 Imaging   CT chest abdomen pelvis showed stable postoperative appearance of the left groin.  No evidence of local recurrence.  No evidence of metastatic disease in the chest abdomen or pelvis.  Hepatic steatosis.  Stable subcentimeter fluid attenuation lesion of the lateral right lobe of the liver, likely benign cyst or hemangioma.  Coronary artery disease.  Aortic atherosclerosis   10/20/2020 Imaging   CT chest abdomen pelvis showed stable postoperative/radiation appearance of the left groin.  No evidence of local recurrence/metastatic disease within the chest abdomen/pelvis.  Fatty liver disease.  Diverticulosis  without evidence of typhlitis.  Aortic atherosclerosis   03/10/2021 Imaging   MRI brain without contrast showed no definitive evidence of intracranial metastatic disease.  Study is limited by absence of intravenous contrast.     04/26/2021 Imaging   CT chest abdomen pelvis without contrast showed stable post operative changes of left groin with no evidence of recurrent disease.  No evidence of metastatic disease in the chest abdomen pelvis.  Aortic atherosclerosis   08/08/2021 - 08/16/2021 Hospital Admission    patient was hospitalized due to NSTEMI status post CABG x3.  Edward Pitts also had pacemaker The echocardiogram showed left ventricular ejection fraction of 65 to 70%,   09/15/2021 Imaging   CT chest abdomen pelvis w contrast  IMPRESSION: 1. Subtle hypodense 9 mm lesion in the left lobe of the liver is new from prior imaging including previous contrasted CT dating back to December 10, 2019, with the lesion appearing to equilibrate with background liver on delayed imaging sequence but is incompletely evaluated on this imaging study and technically nonspecific possibly reflecting a benign perfusional variant and while its appearance is not typical for that of a melanoma metastasis, it is not excluded on this examination. Suggest more definitive characterization by hepatic protocol MRI with and without contrast. 2. Stable postoperative changes in the left groin without evidence of local recurrent disease.3. No evidence of metastatic disease in the chest or pelvis.4.  Aortic Atherosclerosis (ICD10-I70.0).      09/23/2021 Imaging   Contrast-enhanced liver ultrasound Mildly hypoenhancing 2.2 cm mass in the posterior aspect of the left lobe of the liver with washout characteristics concerning for non hepatocellular malignancy, concerning for melanotic metastasis given history. The lesion  is in an unfavorable location for percutaneous biopsy. Consider PET-CT for further characterization   10/21/2021 -   Chemotherapy   Nivolumab  q14d      11/10/2021 Imaging   MRI Brain w wo contrast Negative for metastatic disease to the brain   11/10/2021 Imaging   MRI abdomen w wo contrast 1. Lesion of the posterior superior left lobe of the liver, hepatic segment II, measuring 1.9 x 1.8 cm corresponding to findings of prior imaging. Evaluation is somewhat limited breath motion artifact however there is subtle associated rim enhancement of this lesion. Findings are most in keeping with a hepatic metastasis in the setting of known recurrent melanoma. 2. Mild hepatic steatosis. 3. Cardiomegaly.    02/27/2022 - 02/27/2022 Chemotherapy   Patient is on Treatment Plan : Capecitabine  (825 mg/m2 bid) + XRT     07/12/2022 -  Chemotherapy   Patient is on Treatment Plan : PANCREAS Gemcitabine  D1,8(1000) q21d     Cholangiocarcinoma (HCC)  12/19/2021 Initial Diagnosis   Liver biopsy showed carcinoma  -The slides on the patient's prior left inguinal lymph node biopsy ([id number]) with metastatic melanoma were reviewed in conjunction with  this case. The morphology of the malignant cells between the two cases are dissimilar. A limited panel of immunohistochemical stains was performed and the neoplastic cells are positive for superpancytokeratin, cytokeratin 7 (diffuse, strong), cytokeratin 20 (patchy, moderate) and negative for S100, SOX-10, and HepPar-1.These findings are consistent with carcinoma. The morphologic findings and pattern of immunohistochemical staining are non-specific and possible sites of origin include but are not limited to pancreaticobiliary tract, GI tract, prostate, kidney, lung, and breast. Per [facility], the patient had a PET scan performed in July 2023 which demonstrated a single hepatic  hypermetabolic lesion without other sites of disease.    01/19/2022 Surgery   Liver lesion resection at [facility] by Dr.Zani  A.  Liver, segment 2, 3, 4A, partial hepatectomy:   Cholangiocarcinoma, moderately differentiated  (2.5 cm, segment 2).  Tumor extends to hepatic parenchymal margin, but all other margins are negative for tumor. See synoptic report and comment.   Background liver with: Mild steatosis (20%).  No definitive evidence of steatohepatitis.   Mild periportal fibrosis (trichrome and reticulin). No stainable iron  (Prussian blue stain).  No evidence to support alpha-1-antitrypsin deficiency on PAS-D stain.   pT1a pNx   01/25/2022 Cancer Staging   Staging form: Intrahepatic Bile Duct, AJCC 8th Edition - Pathologic stage from 01/25/2022: Stage Unknown (pT1a, pNX, cM0) - Signed by Timmy Forbes, MD on 02/21/2022 Stage prefix: Initial diagnosis   03/23/2022 - 05/01/2022 Radiation Therapy   Concurrent Xeloda  825mg /m2 BID + Radiation.    05/08/2022 -  Chemotherapy   Xeloda  [1000 mg/m2 BID for 14 of every 21 days], plan 4 months.   05/08/2022, cycle 1 Xeloda .  Xeloda  was stopped on 05/19/22 due to developing rash on face as well as dorsum of hands. Treated with steroid,symptoms resolved.  3/4-3/13/2024  Xeloda  was stopped early due to groin cellulitis, Edward Pitts also developed similar skin rash again  Plan 3/25 cycle 3 dose reduce Xeloda  825 mg/m2   2000mg  BID x 14 days.   05/09/2022 Imaging   CT chest abdomen pelvis with contrast at [facility] showed Status post partial left hepatectomy without CT evidence of recurrent or metastatic disease in the chest, abdomen, pelvis.     07/24/2022 - 07/26/2022 Hospital Admission   Patient was hospitalized due to symptomatic anemia, status post 1 unit of PRBC transfusion. Also neutropenia secondary to chemotherapy.  Nadir was 0.9.  Patient was afebrile.   09/05/2022 Imaging   CT chest abdomen pelvis with contrast at [facility]  Postsurgical changes of left hepatectomy without evidence of recurrent or  metastatic disease.    01/02/2023 Imaging   CT chest abdomen pelvis w contrast [facility] No evidence of recurrent or metastatic disease.    05/01/2023 Imaging   CT chest abdomen pelvis w  contrast at [facility] showed  Impression:  1. No findings of locally recurrent or metastatic disease within the  chest, abdomen or pelvis.  2.  Diffuse hepatic steatosis.     05/01/2023 Tumor Marker   CA 19.9   8 CEA 0.3     Interval History: Edward Pitts presents for clinical follow up after increasing Vimpat  in March.  Edward Pitts did have 2 seizures over the past 3 months, both detailed in his journal.  Typical semiology.  This is a decrease in frequency from prior.  Remains on imaging surveillance only with Dr. Wilhelmenia Harada.  Prior (06/08/23) Recently was hospitalized for syncope and afib. Continues to describe sporadic seizures, described as blurry vision, head spinning, confusion.  Frequency remains about once per month.  No longer on chemotherapy with Dr. Wilhelmenia Harada at this time.  Denies headaches.  H+P (04/15/21) Patient presents for follow up given neurologic complaints.  Edward Pitts has seen neurologist in [place] for several years, relevant history is obtained from review of prior records.  In short, Edward Pitts experiences paroxysmal episodes of head in a vice, disconnected, in a daze, can't understand language.  Episodes started in 2019, last 1-2 minutes, and occur 2-3x per month.  Edward Pitts thinks frequency decreased when Clonazepam  was started by Dr. Tilda Fogo in 2019.  Edward Pitts had normal EEG at that time.  Recently was given a trial of Keppra , but Edward Pitts experienced significant dizziness and stopped it.  Had MRI brain without contrast done recently through Dr. Wilhelmenia Harada.  Continues on nivolumab  infusions for melanoma.   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  Medications: Current Outpatient Medications on File Prior to Visit  Medication Sig Dispense Refill   apixaban  (ELIQUIS ) 5 MG TABS tablet Take 5 mg by mouth 2 (two) times daily.     aspirin  EC 81 MG tablet Take 81 mg by mouth daily. Swallow whole.     atorvastatin  (LIPITOR) 20 MG  tablet Take 20 mg by mouth daily.     Calcium -Vitamin D -Vitamin K 750-500-40 MG-UNT-MCG TABS Take 1 tablet by mouth daily.     clonazePAM  (KLONOPIN ) 0.5 MG tablet TAKE 1 TABLET BY MOUTH IN THE MORNING AND 1 BY MOUTH IN THE EVENING PER PT 120 tablet 1   clotrimazole  (CLOTRIMAZOLE  ANTI-FUNGAL) 1 % cream Apply 1 Application topically 2 (two) times daily. Apply to the L inguinal fold 30 g 0   febuxostat  (ULORIC ) 40 MG tablet Take 40 mg by mouth daily.     hydrocortisone  cream 0.5 % Apply 1 Application topically.     Lacosamide  150 MG TABS TAKE 1 TABLET (150 MG TOTAL) BY MOUTH IN THE MORNING AND AT BEDTIME. 60 tablet 2   lidocaine -prilocaine  (EMLA ) cream Apply 1 Application topically as needed. Apply small amount of cream to port site approx 1-2 hours prior to appointment. 30 g 11   lisinopril  (ZESTRIL ) 10 MG tablet Take 10 mg by mouth daily.     metoprolol  succinate (TOPROL -XL) 25 MG 24 hr tablet Take 25 mg by mouth daily.  miconazole (MICOTIN) 2 % powder Apply topically as needed for itching.     Multiple Vitamin (MULTIVITAMIN WITH MINERALS) TABS tablet Take 1 tablet by mouth daily. Centrum Silver      nystatin  (MYCOSTATIN /NYSTOP ) powder Apply 1 Application topically 3 (three) times daily. (Patient taking differently: Apply 1 Application topically 3 (three) times daily as needed (irritation).) 60 g 1   ondansetron  (ZOFRAN ) 4 MG tablet TAKE 1 TABLET BY MOUTH EVERY 8 HOURS AS NEEDED FOR NAUSEA AND VOMITING 90 tablet 1   Propylene Glycol (SYSTANE BALANCE) 0.6 % SOLN Place 1 drop into both eyes as needed (dry eyes).     traZODone  (DESYREL ) 50 MG tablet Take 50 mg by mouth at bedtime as needed for sleep.     omeprazole  (PRILOSEC) 20 MG capsule TAKE 1 CAPSULE BY MOUTH EVERY DAY (Patient not taking: Reported on 09/14/2023) 90 capsule 0   prochlorperazine  (COMPAZINE ) 10 MG tablet Take 1 tablet (10 mg total) by mouth every 6 (six) hours as needed for nausea or vomiting. (Patient not taking: Reported on  09/14/2023) 30 tablet 0   Current Facility-Administered Medications on File Prior to Visit  Medication Dose Route Frequency Provider Last Rate Last Admin   heparin  lock flush 100 UNIT/ML injection            heparin  lock flush 100 UNIT/ML injection            heparin  lock flush 100 unit/mL  500 Units Intravenous Once Yu, Zhou, MD       sodium chloride  flush (NS) 0.9 % injection 10 mL  10 mL Intravenous PRN Timmy Forbes, MD        Allergies:  Allergies  Allergen Reactions   Hydrochlorothiazide  Other (See Comments)   Ibuprofen  Other (See Comments)    Affects kidneys   Levetiracetam  Hives, Rash and Dermatitis    Other reaction(s): Dizziness, Headache   Nsaids     Affects kidneys   Capecitabine  Rash and Dermatitis    Other Reaction(s): Eruption of skin   Past Medical History:  Past Medical History:  Diagnosis Date   AKI (acute kidney injury) (HCC) 08/24/2018   Anemia    iron  treatments   Anxiety    Aortic atherosclerosis (HCC)    Arthritis    Cancer of groin (HCC) 2021   left groin, resected, radiation   Cataract    Complication of anesthesia    PONV   Coronary artery disease    Dizziness 12/22/2019   Dizziness of unknown etiology    has led to seizures and passing out.   Elevated serum creatinine 04/15/2021   Exposure to combat 04/20/2020   Exposure to potentially hazardous substance 09/18/2022   Sep 11, 2022 Entered By: Lourdes Roy Comment: Entered automatically through [facility] Problem List documentation program     Family history of adverse reaction to anesthesia    PONV mother   GERD (gastroesophageal reflux disease)    H/O Malignant melanoma 09/18/2022   History of complete heart block    PPM placed   Hyperlipidemia    Hypertension    LBBB (left bundle branch block)    Lymphedema of left leg    uses thigh high compression stockings   Melanoma (HCC) 2012   skin cancer, left thigh   OSA on CPAP    PONV (postoperative nausea and vomiting) 04/16/2019   Port-A-Cath in  place    RIGHT chest wall   Presence of cardiac pacemaker    [employer]   Problem related to unspecified psychosocial  circumstances 09/18/2022   Seizures (HCC)    still has episodes of dizziness. last event 1 month ago (march 2022) and will pass out. takes clonazepam    Past Surgical History:  Past Surgical History:  Procedure Laterality Date   CARDIOVERSION N/A 05/15/2023   Procedure: CARDIOVERSION;  Surgeon: Percival Brace, MD;  Location: [facility] ORS;  Service: Cardiovascular;  Laterality: N/A;   CORONARY ARTERY BYPASS GRAFT N/A 08/10/2021   Procedure: CORONARY ARTERY BYPASS GRAFTING (CABG) X 3 USING LEFT INTERNAL MAMMARY ARTERY AND RIGHT GREATER SAPHENOUS VEIN;  Surgeon: Shon Downing, MD;  Location: [facility] OR;  Service: Open Heart Surgery;  Laterality: N/A;   CT RADIATION THERAPY GUIDE     left groin   dental implant     permanent implant   ENDOVEIN HARVEST OF GREATER SAPHENOUS VEIN Right 08/10/2021   Procedure: ENDOVEIN HARVEST OF GREATER SAPHENOUS VEIN;  Surgeon: Shon Downing, MD;  Location: [facility] OR;  Service: Open Heart Surgery;  Laterality: Right;   KNEE SURGERY Left    arthroscopy   LEFT HEART CATH AND CORONARY ANGIOGRAPHY Left 06/29/2017   Procedure: LEFT HEART CATH AND CORONARY ANGIOGRAPHY;  Surgeon: Michelle Aid, MD;  Location: [facility] INVASIVE CV LAB;  Service: Cardiovascular;  Laterality: Left;   LEFT HEART CATH AND CORONARY ANGIOGRAPHY N/A 08/08/2021   Procedure: LEFT HEART CATH AND CORONARY ANGIOGRAPHY;  Surgeon: Michelle Aid, MD;  Location: [facility] INVASIVE CV LAB;  Service: Cardiovascular;  Laterality: N/A;   LYMPH NODE DISSECTION Left 04/16/2019   Procedure: Left inguinal Lymph Node Dissection;  Surgeon: Lockie Rima, MD;  Location: [facility] OR;  Service: General;  Laterality: Left;   MELANOMA EXCISION Left 04/16/2019   Procedure: MELANOMA EXCISION LEFT GROIN MASS;  Surgeon: Lockie Rima, MD;  Location: [facility] OR;  Service: General;  Laterality: Left;   MELANOMA EXCISION WITH  SENTINEL LYMPH NODE BIOPSY Left 2012   Left calf    PACEMAKER INSERTION N/A 08/26/2018   Procedure: INSERTION PACEMAKER;  Surgeon: Percival Brace, MD;  Location: [facility] ORS;  Service: Cardiovascular;  Laterality: N/A;   PORTA CATH INSERTION N/A 08/26/2019   Procedure: PORTA CATH INSERTION;  Surgeon: Jackquelyn Mass, MD;  Location: [facility] INVASIVE CV LAB;  Service: Cardiovascular;  Laterality: N/A;   SUPERFICIAL LYMPH NODE BIOPSY / EXCISION Left 2020   lymph nodes removed around left groin melanoma site   TEE WITHOUT CARDIOVERSION N/A 08/10/2021   Procedure: TRANSESOPHAGEAL ECHOCARDIOGRAM (TEE);  Surgeon: Shon Downing, MD;  Location: [facility] OR;  Service: Open Heart Surgery;  Laterality: N/A;   TEMPORARY PACEMAKER N/A 08/25/2018   Procedure: TEMPORARY PACEMAKER;  Surgeon: Arnoldo Lapping, MD;  Location: [facility] INVASIVE CV LAB;  Service: Cardiovascular;  Laterality: N/A;   TOTAL HIP ARTHROPLASTY Left 07/14/2020   Procedure: TOTAL HIP ARTHROPLASTY;  Surgeon: Arlyne Lame, MD;  Location: [facility] ORS;  Service: Orthopedics;  Laterality: Left;   Social History:  Social History   Socioeconomic History   Marital status: Married    Spouse name: Antony Baumgartner    Number of children: 7   Years of education: 12   Highest education level: Not on file  Occupational History    Comment: disability  Tobacco Use   Smoking status: Never   Smokeless tobacco: Never  Vaping Use   Vaping status: Never Used  Substance and Sexual Activity   Alcohol  use: No   Drug use: No   Sexual activity: Not Currently  Other Topics Concern   Not on file  Social History Narrative   Lives  with  Wife,   Has 2 small dogs   Caffeine use: sodas (2 per day)      Out of work on disability.  Has a walk in shower. No stairs to climb   Oncology treatment ongoing. Uses port a cath for treatment.      pacemaker   Social Drivers of Health   Financial Resource Strain: Low Risk  (02/12/2019)   Overall Financial Resource Strain (CARDIA)     Difficulty of Paying Living Expenses: Not hard at all  Food Insecurity: No Food Insecurity (03/15/2023)   Hunger Vital Sign    Worried About Running Out of Food in the Last Year: Never true    Ran Out of Food in the Last Year: Never true  Transportation Needs: No Transportation Needs (03/15/2023)   PRAPARE - Administrator, Civil Service (Medical): No    Lack of Transportation (Non-Medical): No  Physical Activity: Inactive (03/15/2023)   Exercise Vital Sign    Days of Exercise per Week: 0 days    Minutes of Exercise per Session: 0 min  Stress: No Stress Concern Present (03/15/2023)   [employer] of Occupational Health - Occupational Stress Questionnaire    Feeling of Stress : Not at all  Social Connections: Unknown (02/12/2019)   Social Connection and Isolation Panel    Frequency of Communication with Friends and Family: More than three times a week    Frequency of Social Gatherings with Friends and Family: Not on file    Attends Religious Services: Not on file    Active Member of Clubs or Organizations: Not on file    Attends Banker Meetings: Not on file    Marital Status: Married  Intimate Partner Violence: Not At Risk (03/15/2023)   Humiliation, Afraid, Rape, and Kick questionnaire    Fear of Current or Ex-Partner: No    Emotionally Abused: No    Physically Abused: No    Sexually Abused: No   Family History:  Family History  Problem Relation Age of Onset   Cancer Paternal Grandmother     Review of Systems: Constitutional: Doesn't report fevers, chills or abnormal weight loss Eyes: Doesn't report blurriness of vision Ears, nose, mouth, throat, and face: Doesn't report sore throat Respiratory: Doesn't report cough, dyspnea or wheezes Cardiovascular: Doesn't report palpitation, chest discomfort  Gastrointestinal:  Doesn't report nausea, constipation, diarrhea GU: Doesn't report incontinence Skin: Doesn't report skin rashes Neurological:  Per HPI Musculoskeletal: Doesn't report joint pain Behavioral/Psych: Doesn't report anxiety  Physical Exam: Wt Readings from Last 3 Encounters:  09/14/23 248 lb (112.5 kg)  09/07/23 251 lb 4.8 oz (114 kg)  08/22/23 253 lb 12.8 oz (115.1 kg)   Temp Readings from Last 3 Encounters:  09/14/23 99 F (37.2 C) (Tympanic)  09/07/23 98.1 F (36.7 C) (Tympanic)  08/20/23 97.8 F (36.6 C) (Oral)   BP Readings from Last 3 Encounters:  09/14/23 (!) 140/58  09/07/23 132/61  08/22/23 138/68   Pulse Readings from Last 3 Encounters:  09/14/23 74  09/07/23 85  08/22/23 87   KPS: 90. General: Alert, cooperative, pleasant, in no acute distress Head: Normal EENT: No conjunctival injection or scleral icterus.  Lungs: Resp effort normal Cardiac: Regular rate Abdomen: Non-distended abdomen Skin: erythematous rash, bilateral hands Extremities: No clubbing or edema  Neurologic Exam: Mental Status: Awake, alert, attentive to examiner. Oriented to self and environment. Language is fluent with intact comprehension.  Cranial Nerves: Visual acuity is grossly normal. Visual fields are  full. Extra-ocular movements intact. No ptosis. Face is symmetric Motor: Tone and bulk are normal. Power is full in both arms and legs. Reflexes are symmetric, no pathologic reflexes present.  Sensory: Intact to light touch Gait: Normal.   Labs: I have reviewed the data as listed    Component Value Date/Time   NA 141 08/20/2023 1046   NA 141 03/15/2023 1055   K 3.9 08/20/2023 1046   CL 108 08/20/2023 1046   CO2 26 08/20/2023 1046   GLUCOSE 135 (H) 08/20/2023 1046   BUN 17 08/20/2023 1046   BUN 21 03/15/2023 1055   CREATININE 1.08 08/20/2023 1046   CREATININE 1.06 11/08/2022 0848   CALCIUM  8.5 (L) 08/20/2023 1046   PROT 7.0 08/20/2023 1223   PROT 7.3 03/15/2023 1055   ALBUMIN  3.8 08/20/2023 1223   ALBUMIN  4.3 03/15/2023 1055   AST 42 (H) 08/20/2023 1223   AST 49 (H) 11/08/2022 0848   ALT 64 (H)  08/20/2023 1223   ALT 81 (H) 11/08/2022 0848   ALKPHOS 59 08/20/2023 1223   BILITOT 0.6 08/20/2023 1223   BILITOT 0.4 03/15/2023 1055   BILITOT 0.4 11/08/2022 0848   GFRNONAA >60 08/20/2023 1046   GFRNONAA >60 11/08/2022 0848   GFRAA >60 12/25/2019 0905   Lab Results  Component Value Date   WBC 4.3 08/20/2023   NEUTROABS 6.2 03/15/2023   HGB 11.4 (L) 08/20/2023   HCT 34.0 (L) 08/20/2023   MCV 90.2 08/20/2023   PLT 157 08/20/2023    Assessment/Plan Seizure disorder (HCC)  Kameren Pargas is clinically stable today.  Seizures continue to persist, now less than monthly.  They continue to be modestly disruptive to his functioning.  Recommended trial of increased Vimpat  to 220mg  BID.  Clonazepam  0.5/0.5 will also remain for seizure prevention.  Edward Pitts is agreeable with this.  Also discussed and recommended trial of Nayzilam  5mg  spray for prolonged seizure greater than 5 minutes, or seizure cluster.  We appreciate the opportunity to participate in the care of Vale R[place].  Edward Pitts will follow up in clinic in 3 months or sooner if needed.  All questions were answered. The patient knows to call the clinic with any problems, questions or concerns. No barriers to learning were detected.  The total time spent in the encounter was 30 minutes and more than 50% was on counseling and review of test results   Mamie Searles, MD Medical Director of Neuro-Oncology [facility] at [facility] 09/14/23 10:11 AM

## 2023-09-20 ENCOUNTER — Telehealth: Payer: Self-pay | Admitting: *Deleted

## 2023-09-20 NOTE — Telephone Encounter (Signed)
 09/18/2025 collaborative delivered [employer] prior authorization requests for Nayzilam  SPR 5MG /0.81ml.  Attempted CoverMyMeds through end of business day.   Today, connected with prior authorization advocate, Hazel ().  Verbal on-line communication meets quantity limits.  Advocate reports a request entered for a quantity of twenty for a duration of two days,  It has been approved through 03/26/2024 for 1 box of ten ampule which is twenty doses for thirty days.   Connected with [facility] [id number]). Advised of above.  We figured it out and got it cleared up.  There will be no charge to patient.  It is ready for Pick up.

## 2023-09-24 DIAGNOSIS — G4733 Obstructive sleep apnea (adult) (pediatric): Secondary | ICD-10-CM | POA: Diagnosis not present

## 2023-10-11 DIAGNOSIS — M1A00X0 Idiopathic chronic gout, unspecified site, without tophus (tophi): Secondary | ICD-10-CM | POA: Diagnosis not present

## 2023-10-11 DIAGNOSIS — Z79899 Other long term (current) drug therapy: Secondary | ICD-10-CM | POA: Diagnosis not present

## 2023-10-26 ENCOUNTER — Encounter: Payer: Self-pay | Admitting: Family

## 2023-10-26 ENCOUNTER — Ambulatory Visit (INDEPENDENT_AMBULATORY_CARE_PROVIDER_SITE_OTHER): Admitting: Family

## 2023-10-26 VITALS — BP 126/70 | HR 86 | Ht 67.0 in | Wt 247.6 lb

## 2023-10-26 DIAGNOSIS — R7303 Prediabetes: Secondary | ICD-10-CM | POA: Diagnosis not present

## 2023-10-26 DIAGNOSIS — I10 Essential (primary) hypertension: Secondary | ICD-10-CM

## 2023-10-26 DIAGNOSIS — Z95811 Presence of heart assist device: Secondary | ICD-10-CM | POA: Diagnosis not present

## 2023-10-26 DIAGNOSIS — D61818 Other pancytopenia: Secondary | ICD-10-CM

## 2023-10-26 DIAGNOSIS — E538 Deficiency of other specified B group vitamins: Secondary | ICD-10-CM | POA: Diagnosis not present

## 2023-10-26 DIAGNOSIS — C779 Secondary and unspecified malignant neoplasm of lymph node, unspecified: Secondary | ICD-10-CM

## 2023-10-26 DIAGNOSIS — N1831 Chronic kidney disease, stage 3a: Secondary | ICD-10-CM

## 2023-10-26 DIAGNOSIS — E559 Vitamin D deficiency, unspecified: Secondary | ICD-10-CM | POA: Diagnosis not present

## 2023-10-26 DIAGNOSIS — E782 Mixed hyperlipidemia: Secondary | ICD-10-CM | POA: Diagnosis not present

## 2023-10-26 DIAGNOSIS — E66813 Obesity, class 3: Secondary | ICD-10-CM

## 2023-10-26 NOTE — Assessment & Plan Note (Signed)
 Patient is seen by oncology, who manage this condition.  He is well controlled with current therapy.   Will defer to them for further changes to plan of care.

## 2023-10-26 NOTE — Assessment & Plan Note (Signed)
 A1C Continues to be in prediabetic ranges.  Will reassess at follow up after next lab check.  Patient counseled on dietary choices and verbalized understanding.   -CBC w/Diff -CMP w/eGFR -Hemoglobin A1C

## 2023-10-26 NOTE — Assessment & Plan Note (Signed)
 Checking labs today.  Continue current therapy for lipid control. Will modify as needed based on labwork results.   -CMP w/eGFR -Lipid Panel

## 2023-10-26 NOTE — Assessment & Plan Note (Signed)
 Continue current meds.  Will adjust as needed based on results.  The patient is asked to make an attempt to improve diet and exercise patterns to aid in medical management of this problem. Addressed importance of increasing and maintaining water intake.

## 2023-10-26 NOTE — Assessment & Plan Note (Signed)
 Blood pressure well controlled with current medications.  Continue current therapy.  Will reassess at follow up.   - CBC w/Diff - CMP w/eGFR

## 2023-10-26 NOTE — Progress Notes (Signed)
 Established Patient Office Visit  Subjective:  Patient ID: Edward Pitts, male    DOB: 1960/06/16  Age: 63 y.o. MRN: [id number]  Chief Complaint  Patient presents with   Follow-up    4 month follow up    Patient is here today for his 4 months follow up.  He has been feeling fairly well since last appointment.   He does have additional concerns to discuss today.  Has follow up in October with Onc, if good, will go every 6 months.  Also thinks he has gotten his ears cleaned out completely.   Labs are due today.  He needs refills.   I have reviewed his active problem list, medication list, allergies, notes from last encounter, lab results for his appointment today.     No other concerns at this time.   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   Elevated serum creatinine 04/15/2021   Exposure to combat 04/20/2020   Exposure to potentially hazardous substance 09/18/2022   Sep 11, 2022 Entered By: ROSANA DRAGON Comment: Entered automatically through [facility] Problem List documentation program     Family history of adverse reaction to anesthesia    PONV mother   GERD (gastroesophageal reflux disease)    H/O Malignant melanoma 09/18/2022   History of complete heart block    PPM placed   Hyperlipidemia    Hypertension    LBBB (left bundle branch block)    Lymphedema of left leg    uses thigh high compression stockings   Melanoma (HCC) 2012   skin cancer, left thigh   OSA on CPAP    PONV (postoperative nausea and vomiting) 04/16/2019   Port-A-Cath in place    RIGHT chest wall   Presence of cardiac pacemaker     [employer]   Problem related to unspecified psychosocial circumstances 09/18/2022   Seizures (HCC)    still has episodes of dizziness. last event 1 month ago (march 2022) and will pass out. takes clonazepam     Past Surgical History:  Procedure Laterality Date   CARDIOVERSION N/A 05/15/2023   Procedure: CARDIOVERSION;  Surgeon: Ammon Blunt, MD;  Location: [facility] ORS;  Service: Cardiovascular;  Laterality: N/A;   CORONARY ARTERY BYPASS GRAFT N/A 08/10/2021   Procedure: CORONARY ARTERY BYPASS GRAFTING (CABG) X 3 USING LEFT INTERNAL MAMMARY ARTERY AND RIGHT GREATER SAPHENOUS VEIN;  Surgeon: Obadiah Coy, MD;  Location: [facility] OR;  Service: Open Heart Surgery;  Laterality: N/A;   CT RADIATION THERAPY GUIDE     left groin   dental implant     permanent implant   ENDOVEIN HARVEST OF GREATER SAPHENOUS VEIN Right 08/10/2021   Procedure: ENDOVEIN HARVEST OF GREATER SAPHENOUS VEIN;  Surgeon: Obadiah Coy, MD;  Location: [facility] OR;  Service: Open Heart Surgery;  Laterality: Right;   KNEE SURGERY Left    arthroscopy   LEFT HEART CATH AND CORONARY ANGIOGRAPHY Left 06/29/2017   Procedure: LEFT HEART CATH AND CORONARY ANGIOGRAPHY;  Surgeon: Hester Wolm PARAS, MD;  Location: [facility] INVASIVE CV LAB;  Service:  Cardiovascular;  Laterality: Left;   LEFT HEART CATH AND CORONARY ANGIOGRAPHY N/A 08/08/2021   Procedure: LEFT HEART CATH AND CORONARY ANGIOGRAPHY;  Surgeon: Hester Wolm PARAS, MD;  Location: [facility] INVASIVE CV LAB;  Service: Cardiovascular;  Laterality: N/A;   LYMPH NODE DISSECTION Left 04/16/2019   Procedure: Left inguinal Lymph Node Dissection;  Surgeon: Aron Shoulders, MD;  Location: [facility];  Service: General;  Laterality: Left;   MELANOMA EXCISION Left 04/16/2019   Procedure: MELANOMA EXCISION LEFT GROIN MASS;  Surgeon: Aron Shoulders, MD;  Location: [facility] OR;  Service: General;  Laterality: Left;   MELANOMA EXCISION WITH SENTINEL LYMPH NODE BIOPSY Left 2012   Left calf    PACEMAKER INSERTION N/A 08/26/2018    Procedure: INSERTION PACEMAKER;  Surgeon: Ammon Blunt, MD;  Location: [facility] OR[facility];  Service: Cardiovascular;  Laterality: N/A;   PORTA CATH INSERTION N/A 08/26/2019   Procedure: PORTA CATH INSERTION;  Surgeon: Jama Cordella MATSU, MD;  Location: [facility];  Service: Cardiovascular;  Laterality: N/A;   SUPERFICIAL LYMPH NODE BIOPSY / EXCISION Left 2020   lymph nodes removed around left groin melanoma site   TEE WITHOUT CARDIOVERSION N/A 08/10/2021   Procedure: TRANSESOPHAGEAL ECHOCARDIOGRAM (TEE);  Surgeon: Obadiah Coy, MD;  Location: [facility] OR;  Service: Open Heart Surgery;  Laterality: N/A;   TEMPORARY PACEMAKER N/A 08/25/2018   Procedure: TEMPORARY PACEMAKER;  Surgeon: Wonda Sharper, MD;  Location: [facility] INVASIVE CV LAB;  Service: Cardiovascular;  Laterality: N/A;   TOTAL HIP ARTHROPLASTY Left 07/14/2020   Procedure: TOTAL HIP ARTHROPLASTY;  Surgeon: Mardee Lynwood SQUIBB, MD;  Location: [facility] ORS;  Service: Orthopedics;  Laterality: Left;    Social History   Socioeconomic History   Marital status: Married    Spouse name: Therisa    Number of children: 7   Years of education: 12   Highest education level: Not on file  Occupational History    Comment: disability  Tobacco Use   Smoking status: Never   Smokeless tobacco: Never  Vaping Use   Vaping status: Never Used  Substance and Sexual Activity   Alcohol  use: No   Drug use: No   Sexual activity: Not Currently  Other Topics Concern   Not on file  Social History Narrative   Lives with  Wife,   Has 2 small dogs   Caffeine use: sodas (2 per day)      Out of work on disability.  Has a walk in shower. No stairs to climb   Oncology treatment ongoing. Uses port a cath for treatment.      pacemaker   Social Drivers of Health   Financial Resource Strain: Low Risk  (02/12/2019)   Overall Financial Resource Strain (CARDIA)    Difficulty of Paying Living Expenses: Not hard at all  Food Insecurity: No Food Insecurity  (03/15/2023)   Hunger Vital Sign    Worried About Running Out of Food in the Last Year: Never true    Ran Out of Food in the Last Year: Never true  Transportation Needs: No Transportation Needs (03/15/2023)   PRAPARE - Administrator, Civil Service (Medical): No    Lack of Transportation (Non-Medical): No  Physical Activity: Inactive (03/15/2023)   Exercise Vital Sign    Days of Exercise per Week: 0 days    Minutes of Exercise per Session: 0 min  Stress: No Stress Concern Present (03/15/2023)   [employer] of Occupational Health - Occupational Stress Questionnaire    Feeling of Stress :  Not at all  Social Connections: Unknown (02/12/2019)   Social Connection and Isolation Panel    Frequency of Communication with Friends and Family: More than three times a week    Frequency of Social Gatherings with Friends and Family: Not on file    Attends Religious Services: Not on file    Active Member of Clubs or Organizations: Not on file    Attends Banker Meetings: Not on file    Marital Status: Married  Intimate Partner Violence: Not At Risk (03/15/2023)   Humiliation, Afraid, Rape, and Kick questionnaire    Fear of Current or Ex-Partner: No    Emotionally Abused: No    Physically Abused: No    Sexually Abused: No    Family History  Problem Relation Age of Onset   Cancer Paternal Grandmother     Allergies  Allergen Reactions   Hydrochlorothiazide  Other (See Comments)   Ibuprofen  Other (See Comments)    Affects kidneys   Levetiracetam  Hives, Rash and Dermatitis    Other reaction(s): Dizziness, Headache   Nsaids     Affects kidneys   Capecitabine  Rash and Dermatitis    Other Reaction(s): Eruption of skin    Review of Systems  All other systems reviewed and are negative.      Objective:   BP 126/70   Pulse 86   Ht 5' 7 (1.702 m)   Wt 247 lb 9.6 oz (112.3 kg)   SpO2 97%   BMI 38.78 kg/m   Vitals:   10/26/23 0934  BP: 126/70  Pulse:  86  Height: 5' 7 (1.702 m)  Weight: 247 lb 9.6 oz (112.3 kg)  SpO2: 97%  BMI (Calculated): 38.77    Physical Exam Vitals and nursing note reviewed.  Constitutional:      Appearance: Normal appearance. He is normal weight.  Eyes:     Pupils: Pupils are equal, round, and reactive to light.  Cardiovascular:     Rate and Rhythm: Normal rate and regular rhythm.     Pulses: Normal pulses.     Heart sounds: Normal heart sounds.  Pulmonary:     Effort: Pulmonary effort is normal.     Breath sounds: Normal breath sounds.  Neurological:     Mental Status: He is alert.  Psychiatric:        Mood and Affect: Mood normal.        Behavior: Behavior normal.      No results found for any visits on 10/26/23.  Recent Results (from the past 2160 hours)  Basic metabolic panel     Status: Abnormal   Collection Time: 08/20/23 10:46 AM  Result Value Ref Range   Sodium 141 135 - 145 mmol/L   Potassium 3.9 3.5 - 5.1 mmol/L   Chloride 108 98 - 111 mmol/L   CO2 26 22 - 32 mmol/L   Glucose, Bld 135 (H) 70 - 99 mg/dL    Comment: Glucose reference range applies only to samples taken after fasting for at least 8 hours.   BUN 17 8 - 23 mg/dL   Creatinine, Ser 8.91 0.61 - 1.24 mg/dL   Calcium  8.5 (L) 8.9 - 10.3 mg/dL   GFR, Estimated >39 >39 mL/min    Comment: (NOTE) Calculated using the CKD-EPI Creatinine Equation (2021)    Anion gap 7 5 - 15    Comment: Performed at [facility], [address] [place], [place] [address]  CBC     Status: Abnormal  Collection Time: 08/20/23 10:46 AM  Result Value Ref Range   WBC 4.3 4.0 - 10.5 K/uL   RBC 3.77 (L) 4.22 - 5.81 MIL/uL   Hemoglobin 11.4 (L) 13.0 - 17.0 g/dL   HCT 65.9 (L) 60.9 - 47.9 %   MCV 90.2 80.0 - 100.0 fL   MCH 30.2 26.0 - 34.0 pg   MCHC 33.5 30.0 - 36.0 g/dL   RDW 86.7 88.4 - 84.4 %   Platelets 157 150 - 400 K/uL   nRBC 0.0 0.0 - 0.2 %    Comment: Performed at [facility], [address], [place], [place]  [address]  Troponin I (High Sensitivity)     Status: None   Collection Time: 08/20/23 10:46 AM  Result Value Ref Range   Troponin I (High Sensitivity) 5 <18 ng/L    Comment: (NOTE) Elevated high sensitivity troponin I (hsTnI) values and significant  changes across serial measurements may suggest ACS but many other  chronic and acute conditions are known to elevate hsTnI results.  Refer to the Links section for chest pain algorithms and additional  guidance. Performed at [facility], [address] [place], [place] [address]   Troponin I (High Sensitivity)     Status: None   Collection Time: 08/20/23 12:23 PM  Result Value Ref Range   Troponin I (High Sensitivity) 6 <18 ng/L    Comment: (NOTE) Elevated high sensitivity troponin I (hsTnI) values and significant  changes across serial measurements may suggest ACS but many other  chronic and acute conditions are known to elevate hsTnI results.  Refer to the Links section for chest pain algorithms and additional  guidance. Performed at [facility], [address] [place], [place] [address]   Hepatic function panel     Status: Abnormal   Collection Time: 08/20/23 12:23 PM  Result Value Ref Range   Total Protein 7.0 6.5 - 8.1 g/dL   Albumin  3.8 3.5 - 5.0 g/dL   AST 42 (H) 15 - 41 U/L   ALT 64 (H) 0 - 44 U/L   Alkaline Phosphatase 59 38 - 126 U/L   Total Bilirubin 0.6 0.0 - 1.2 mg/dL   Bilirubin, Direct <9.8 0.0 - 0.2 mg/dL   Indirect Bilirubin NOT CALCULATED 0.3 - 0.9 mg/dL    Comment: Performed at [facility], [address] [place], [place] [address]  D-dimer, quantitative     Status: None   Collection Time: 08/20/23 12:23 PM  Result Value Ref Range   D-Dimer, Quant 0.38 0.00 - 0.50 ug/mL-FEU    Comment: (NOTE) At the manufacturer cut-off value of 0.5 g/mL FEU, this assay has a negative predictive value of 95-100%.This assay is intended for use in conjunction with a clinical pretest  probability (PTP) assessment model to exclude pulmonary embolism (PE) and deep venous thrombosis (DVT) in outpatients suspected of PE or DVT. Results should be correlated with clinical presentation. Performed at [facility], [address] [place], [place] [address]   Sedimentation rate     Status: Abnormal   Collection Time: 08/20/23  2:20 PM  Result Value Ref Range   Sed Rate 48 (H) 0 - 20 mm/hr    Comment: Performed at [facility], [address] [place], [place] [address]  C-reactive protein     Status: Abnormal   Collection Time: 08/20/23  2:20 PM  Result Value Ref Range   CRP 1.6 (H) <1.0 mg/dL    Comment: Performed at [facility], [address], [place], [place] [address]       Assessment & Plan Presence of heart assist device [facility]) Patient is seen by cardiology, who manage this condition.  He is well controlled with current therapy.   Will defer to them for further changes to plan of care.  Secondary and unspecified malignant neoplasm of lymph node, unspecified (HCC) Other pancytopenia (HCC) Patient is seen by oncology\, who manage this condition.  He is well controlled with current therapy.   Will defer to them for further changes to plan of care.  Obesity, Class III, BMI 40-49.9 (morbid obesity) Continue current meds.  Will adjust as needed based on results.  The patient is asked to make an attempt to improve diet and exercise patterns to aid in medical management of this problem. Addressed importance of increasing and maintaining water  intake.   Stage 3a chronic kidney disease (HCC) Patient is seen by nephrology, who manage this condition.  He is well controlled with current therapy.  Will defer to them for further changes to plan of care.  Mixed hyperlipidemia Checking labs today.  Continue current therapy for lipid control. Will modify as needed based on labwork results.   -CMP w/eGFR -Lipid Panel  Primary  hypertension Blood pressure well controlled with current medications.  Continue current therapy.  Will reassess at follow up.   - CBC w/Diff - CMP w/eGFR  Vitamin D  deficiency, unspecified B12 deficiency due to diet Checking labs today.  Will continue supplements as needed.   - Vitamin D  - Vitamin B12 - TSH  Prediabetes A1C Continues to be in prediabetic ranges.  Will reassess at follow up after next lab check.  Patient counseled on dietary choices and verbalized understanding.   -CBC w/Diff -CMP w/eGFR -Hemoglobin A1C     Return in about 4 months (around 02/25/2024).   Total time spent: 20 minutes  ALAN CHRISTELLA ARRANT, FNP  10/26/2023   This document may have been prepared by [facility] Voice Recognition software and as such may include unintentional dictation errors.

## 2023-10-26 NOTE — Assessment & Plan Note (Signed)
 Patient is seen by nephrology, who manage this condition.  He is well controlled with current therapy.  Will defer to them for further changes to plan of care.

## 2023-10-26 NOTE — Assessment & Plan Note (Signed)
 Patient is seen by cardiology, who manage this condition.  He is well controlled with current therapy.   Will defer to them for further changes to plan of care.

## 2023-10-27 ENCOUNTER — Encounter: Payer: Self-pay | Admitting: Oncology

## 2023-10-27 LAB — VITAMIN B12: Vitamin B-12: 788 pg/mL (ref 232–1245)

## 2023-10-27 LAB — CBC WITH DIFFERENTIAL/PLATELET
Basophils Absolute: 0 x10E3/uL (ref 0.0–0.2)
Basos: 1 %
EOS (ABSOLUTE): 0.1 x10E3/uL (ref 0.0–0.4)
Eos: 3 %
Hematocrit: 37.6 % (ref 37.5–51.0)
Hemoglobin: 12.1 g/dL — ABNORMAL LOW (ref 13.0–17.7)
Immature Grans (Abs): 0 x10E3/uL (ref 0.0–0.1)
Immature Granulocytes: 0 %
Lymphocytes Absolute: 0.8 x10E3/uL (ref 0.7–3.1)
Lymphs: 21 %
MCH: 29.8 pg (ref 26.6–33.0)
MCHC: 32.2 g/dL (ref 31.5–35.7)
MCV: 93 fL (ref 79–97)
Monocytes Absolute: 0.5 x10E3/uL (ref 0.1–0.9)
Monocytes: 11 %
Neutrophils Absolute: 2.6 x10E3/uL (ref 1.4–7.0)
Neutrophils: 64 %
Platelets: 169 x10E3/uL (ref 150–450)
RBC: 4.06 x10E6/uL — ABNORMAL LOW (ref 4.14–5.80)
RDW: 13.3 % (ref 11.6–15.4)
WBC: 4 x10E3/uL (ref 3.4–10.8)

## 2023-10-27 LAB — LIPID PANEL
Chol/HDL Ratio: 4.2 ratio (ref 0.0–5.0)
Cholesterol, Total: 147 mg/dL (ref 100–199)
HDL: 35 mg/dL — ABNORMAL LOW (ref >39)
LDL Chol Calc (NIH): 77 mg/dL (ref 0–99)
Triglycerides: 210 mg/dL — ABNORMAL HIGH (ref 0–149)
VLDL Cholesterol Cal: 35 mg/dL (ref 5–40)

## 2023-10-27 LAB — HEMOGLOBIN A1C
Est. average glucose Bld gHb Est-mCnc: 111 mg/dL
Hgb A1c MFr Bld: 5.5 % (ref 4.8–5.6)

## 2023-10-27 LAB — TSH: TSH: 2.85 u[IU]/mL (ref 0.450–4.500)

## 2023-10-27 LAB — CMP14+EGFR
ALT: 68 IU/L — ABNORMAL HIGH (ref 0–44)
AST: 50 IU/L — ABNORMAL HIGH (ref 0–40)
Albumin: 4.4 g/dL (ref 3.9–4.9)
Alkaline Phosphatase: 83 IU/L (ref 44–121)
BUN/Creatinine Ratio: 17 (ref 10–24)
BUN: 22 mg/dL (ref 8–27)
Bilirubin Total: 0.3 mg/dL (ref 0.0–1.2)
CO2: 22 mmol/L (ref 20–29)
Calcium: 10.2 mg/dL (ref 8.6–10.2)
Chloride: 104 mmol/L (ref 96–106)
Creatinine, Ser: 1.28 mg/dL — ABNORMAL HIGH (ref 0.76–1.27)
Globulin, Total: 3 g/dL (ref 1.5–4.5)
Glucose: 96 mg/dL (ref 70–99)
Potassium: 5.3 mmol/L — ABNORMAL HIGH (ref 3.5–5.2)
Sodium: 140 mmol/L (ref 134–144)
Total Protein: 7.4 g/dL (ref 6.0–8.5)
eGFR: 62 mL/min/1.73 (ref >59)

## 2023-10-27 LAB — VITAMIN D 25 HYDROXY (VIT D DEFICIENCY, FRACTURES): Vit D, 25-Hydroxy: 44.8 ng/mL (ref 30.0–100.0)

## 2023-11-02 ENCOUNTER — Inpatient Hospital Stay: Attending: Oncology

## 2023-11-02 DIAGNOSIS — Z452 Encounter for adjustment and management of vascular access device: Secondary | ICD-10-CM | POA: Diagnosis not present

## 2023-11-02 DIAGNOSIS — C221 Intrahepatic bile duct carcinoma: Secondary | ICD-10-CM | POA: Diagnosis not present

## 2023-11-04 NOTE — Progress Notes (Signed)
 This encounter was created in error - please disregard.

## 2023-11-08 ENCOUNTER — Ambulatory Visit (INDEPENDENT_AMBULATORY_CARE_PROVIDER_SITE_OTHER): Admitting: Internal Medicine

## 2023-11-08 ENCOUNTER — Encounter: Payer: Self-pay | Admitting: Internal Medicine

## 2023-11-08 ENCOUNTER — Telehealth: Payer: Self-pay

## 2023-11-08 VITALS — BP 110/70 | HR 64 | Temp 98.4°F | Ht 67.0 in | Wt 250.8 lb

## 2023-11-08 DIAGNOSIS — G4733 Obstructive sleep apnea (adult) (pediatric): Secondary | ICD-10-CM | POA: Diagnosis not present

## 2023-11-08 NOTE — Patient Instructions (Signed)
 Excellent Job A+ GOLD STAR!!  Continue CPAP as prescribed  Patient Instructions Continue to use CPAP every night, minimum of 4-6 hours a night.  Change equipment every 30 days or as directed by DME.  Wash your tubing with warm soap and water daily, hang to dry. Wash humidifier portion weekly. Use bottled, distilled water and change daily   Be aware of reduced alertness and do not drive or operate heavy machinery if experiencing this or drowsiness.  Exercise encouraged, as tolerated. Encouraged proper weight management.  Important to get eight or more hours of sleep  Limiting the use of the computer and television before bedtime.  Decrease naps during the day, so night time sleep will become enhanced.  Limit caffeine, and sleep deprivation.    Avoid Allergens and Irritants Avoid secondhand smoke Avoid SICK contacts Recommend  Masking  when appropriate Recommend Keep up-to-date with vaccinations

## 2023-11-08 NOTE — Telephone Encounter (Signed)
 Dr. Isaiah spoke with [facility].  Nothing further needed.

## 2023-11-08 NOTE — Telephone Encounter (Signed)
 Copied from CRM [id number]. Topic: Clinical - Medical Advice >> Nov 08, 2023 12:09 PM [name] wrote: Reason for CRM: Edward Pitts is calling to inform Edward Pitts that the program used to obtain downloads for CPAPs is current inaccessible to him and therefore the request cannot currently be fulfilled.   States is unaware of how long access will take but will keep us  updated.

## 2023-11-08 NOTE — Progress Notes (Signed)
 Name: Edward Pitts MRN: [id number] DOB: 11-30-1960    CHIEF COMPLAINT:  Follow-up assessment for OSA    HISTORY OF PRESENT ILLNESS: Follow-up assessment for OSA Patient diagnosed with sleep apnea several years ago Patient currently states that he is using CPAP on a nightly basis Patient reports compliance Patient uses fullface mask Download reviewed in detail with patient I was able to receive an email from [name] They are having an issue accessing his account however I do have a download over the last 1 year Patient with excellent compliance reported to 100% Excellent compliance report with usage of greater than 4 hours AHI is controlled down to 8-10 Patient currently using fullface mask  No exacerbation at this time No evidence of heart failure at this time No evidence or signs of infection at this time No respiratory distress No fevers, chills, nausea, vomiting, diarrhea No evidence of lower extremity edema No evidence hemoptysis  Pulmonary function test reviewed in detail with patient 2023 FEV1 to FVC ratio is 73% predicted FEV1 was 71% predicted FVC was 73% predicted Expiratory flow volume loops showed obstructive pattern ERV elevated at 111% predicted suggesting air trapping  PAST MEDICAL HISTORY :   has a past medical history of AKI (acute kidney injury) (HCC) (08/24/2018), Anemia, Anxiety, Aortic atherosclerosis (HCC), Arthritis, Cancer of groin (HCC) (2021), Cataract, Complication of anesthesia, Coronary artery disease, Dizziness (12/22/2019), Dizziness of unknown etiology, Elevated serum creatinine (04/15/2021), Exposure to combat (04/20/2020), Exposure to potentially hazardous substance (09/18/2022), Family history of adverse reaction to anesthesia, GERD (gastroesophageal reflux disease), H/O Malignant melanoma (09/18/2022), History of complete heart block, Hyperlipidemia, Hypertension, LBBB (left bundle branch block), Lymphedema of left leg, Melanoma (HCC)  (2012), OSA on CPAP, PONV (postoperative nausea and vomiting) (04/16/2019), Port-A-Cath in place, Presence of cardiac pacemaker, Problem related to unspecified psychosocial circumstances (09/18/2022), and Seizures (HCC).  has a past surgical history that includes Knee surgery (Left); LEFT HEART CATH AND CORONARY ANGIOGRAPHY (Left, 06/29/2017); TEMPORARY PACEMAKER (N/A, 08/25/2018); Pacemaker insertion (N/A, 08/26/2018); Melanoma excision with sentinel lymph node dissection (Left, 2012); Melanoma excision (Left, 04/16/2019); Lymph node dissection (Left, 04/16/2019); PORTA CATH INSERTION (N/A, 08/26/2019); Superficial lymph node biopsy / excision (Left, 2020); dental implant; CT RADIATION THERAPY GUIDE; Total hip arthroplasty (Left, 07/14/2020); LEFT HEART CATH AND CORONARY ANGIOGRAPHY (N/A, 08/08/2021); Coronary artery bypass graft (N/A, 08/10/2021); TEE without cardioversion (N/A, 08/10/2021); Endoharvest vein of greater saphenous vein (Right, 08/10/2021); and Cardioversion (N/A, 05/15/2023). Prior to Admission medications   Medication Sig Start Date End Date Taking? Authorizing Provider  ALPRAZolam  (XANAX ) 0.5 MG tablet Take 1 tablet (0.5 mg total) by mouth 2 (two) times daily as needed for anxiety. 08/23/22   Babara Call, MD  apixaban  (ELIQUIS ) 5 MG TABS tablet Take 5 mg by mouth 2 (two) times daily.    [provider]  atorvastatin  (LIPITOR) 20 MG tablet Take 20 mg by mouth daily. 03/08/23   [provider]  Carboxymeth-Glyc-Polysorb PF (REFRESH OPTIVE MEGA-3) 0.5-1-0.5 % SOLN Place 1 drop into both eyes daily as needed (dry eyes).    [provider]  clonazePAM  (KLONOPIN ) 0.5 MG tablet TAKE 1 TABLET BY MOUTH IN THE MORNING AND 1 BY MOUTH IN THE EVENING PER PT 02/06/23   Vaslow, Zachary K, MD  clotrimazole  (CLOTRIMAZOLE  ANTI-FUNGAL) 1 % cream Apply 1 Application topically 2 (two) times daily. Apply to the L inguinal fold 06/07/22   Clide Burnard Ee, MD  febuxostat  (ULORIC ) 40 MG tablet Take 40  mg by mouth daily.  [provider]  hydrocortisone  2.5 % ointment Apply 1 application. topically daily as needed (itching).    [provider]  Lacosamide  100 MG TABS TAKE 1 TABLET (100 MG TOTAL) BY MOUTH IN THE MORNING AND AT BEDTIME. 04/26/23   Vaslow, Zachary K, MD  lidocaine -prilocaine  (EMLA ) cream Apply 1 Application topically as needed. Apply small amount of cream to port site approx 1-2 hours prior to appointment. 11/01/22   Babara Call, MD  lisinopril  (ZESTRIL ) 10 MG tablet Take 10 mg by mouth daily.    [provider]  metoprolol  succinate (TOPROL -XL) 25 MG 24 hr tablet Take 25 mg by mouth daily. 12/15/18   [provider]  Multiple Vitamin (MULTIVITAMIN WITH MINERALS) TABS tablet Take 1 tablet by mouth daily. Centrum Silver     [provider]  nystatin  (MYCOSTATIN /NYSTOP ) powder Apply 1 Application topically 3 (three) times daily. 06/14/22   Babara Call, MD  omeprazole  (PRILOSEC) 20 MG capsule TAKE 1 CAPSULE BY MOUTH EVERY DAY 02/26/23   Babara Call, MD  ondansetron  (ZOFRAN ) 4 MG tablet TAKE 1 TABLET BY MOUTH EVERY 8 HOURS AS NEEDED FOR NAUSEA AND VOMITING 01/26/22   Borders, Joshua R, NP  traZODone  (DESYREL ) 50 MG tablet Take 50 mg by mouth at bedtime as needed. 08/05/22   [provider]   Allergies  Allergen Reactions   Hydrochlorothiazide  Other (See Comments)   Ibuprofen  Other (See Comments)    Affects kidneys   Levetiracetam  Hives, Rash and Dermatitis    Other reaction(s): Dizziness, Headache   Nsaids     Affects kidneys   Capecitabine  Rash and Dermatitis    Other Reaction(s): Eruption of skin    FAMILY HISTORY:  family history includes Cancer in his paternal grandmother. SOCIAL HISTORY:  reports that he has never smoked. He has never used smokeless tobacco. He reports that he does not drink alcohol  and does not use drugs.   BP 110/70 (BP Location: Right Arm, Patient Position: Sitting, Cuff Size: Large)   Pulse 64   Temp 98.4 F  (36.9 C) (Oral)   Ht 5' 7 (1.702 m)   Wt 250 lb 12.8 oz (113.8 kg)   SpO2 95%   BMI 39.28 kg/m       Review of Systems: Gen:  Denies  fever, sweats, chills weight loss  HEENT: Denies blurred vision, double vision, ear pain, eye pain, hearing loss, nose bleeds, sore throat Cardiac:  No dizziness, chest pain or heaviness, chest tightness,edema, No JVD Resp:   No cough, -sputum production, -shortness of breath,-wheezing, -hemoptysis,  Other:  All other systems negative   Physical Examination:   General Appearance: No distress  EYES PERRLA, EOM intact.   NECK Supple, No JVD Pulmonary: normal breath sounds, No wheezing.  CardiovascularNormal S1,S2.  No m/r/g.   Abdomen: Benign, Soft, non-tender. Neurology UE/LE 5/5 strength, no focal deficits Ext pulses intact, cap refill intact ALL OTHER ROS ARE NEGATIVE      ASSESSMENT AND PLAN SYNOPSIS  63 year old white male patient with signs and symptoms of excessive daytime sleepiness with probable underlying diagnosis of obstructive sleep apnea in the setting of obesity and deconditioned state   Assessment of OSA Continue CPAP as prescribed  Excellent compliance report Reviewed compliance report in detail with patient Patient definitely benefits the use of CPAP therapy as prescribed Using CPAP nightly and with naps Pressure setting is comfortable and is sleeping well. CPAP prescription 4-20 AHI reduced to 10  No evidence of acute heart failure at this time No  respiratory distress No fevers, chills, nausea, vomiting, diarrhea No evidence hemoptysis  Patient Instructions Continue to use CPAP every night, minimum of 4-6 hours a night.  Change equipment every 30 days or as directed by DME.  Wash your tubing with warm soap and water  daily, hang to dry. Wash humidifier portion weekly. Use bottled, distilled water  and change daily   Be aware of reduced alertness and do not drive or operate heavy machinery if experiencing  this or drowsiness.  Exercise encouraged, as tolerated. Encouraged proper weight management.  Important to get eight or more hours of sleep  Limiting the use of the computer and television before bedtime.  Decrease naps during the day, so night time sleep will become enhanced.  Limit caffeine, and sleep deprivation.  HTN, stroke, uncontrolled diabetes and heart failure are potential risk factors.  Risk of untreated sleep apnea including cardiac arrhthymias, stroke, DM, pulm HTN.  Obesity -recommend significant weight loss -recommend changing diet  Deconditioned state -Recommend increased daily activity and exercise   MEDICATION ADJUSTMENTS/LABS AND TESTS ORDERED: Excellent compliance report Continue CPAP as prescribed Avoid Allergens and Irritants Avoid secondhand smoke Avoid SICK contacts Recommend  Masking  when appropriate Recommend Keep up-to-date with vaccinations   CURRENT MEDICATIONS REVIEWED AT LENGTH WITH PATIENT TODAY   Patient  satisfied with Plan of action and management. All questions answered   Follow up 1 year   I spent a total of 45 minutes dedicated to the care of this patient on the date of this encounter to include pre-visit review of records, face-to-face time with the patient discussing conditions above, post visit ordering of testing, clinical documentation with the electronic health record, making appropriate referrals as documented, and communicating necessary information to the patient's healthcare team.    The Patient requires high complexity decision making for assessment and support, frequent evaluation and titration of therapies, application of advanced monitoring technologies and extensive interpretation of multiple databases.  Patient satisfied with Plan of action and management. All questions answered    Nickolas Alm Cellar, M.D.  [facility]  Medical Director [facility] Medical Director [facility]  Cardio-Pulmonary Department

## 2023-12-05 DIAGNOSIS — Z23 Encounter for immunization: Secondary | ICD-10-CM | POA: Diagnosis not present

## 2023-12-05 DIAGNOSIS — I442 Atrioventricular block, complete: Secondary | ICD-10-CM | POA: Diagnosis not present

## 2023-12-05 DIAGNOSIS — I5022 Chronic systolic (congestive) heart failure: Secondary | ICD-10-CM | POA: Diagnosis not present

## 2023-12-05 DIAGNOSIS — Z950 Presence of cardiac pacemaker: Secondary | ICD-10-CM | POA: Diagnosis not present

## 2023-12-05 DIAGNOSIS — I483 Typical atrial flutter: Secondary | ICD-10-CM | POA: Diagnosis not present

## 2023-12-16 ENCOUNTER — Other Ambulatory Visit: Payer: Self-pay | Admitting: Internal Medicine

## 2023-12-17 ENCOUNTER — Encounter: Payer: Self-pay | Admitting: Oncology

## 2023-12-18 DIAGNOSIS — I214 Non-ST elevation (NSTEMI) myocardial infarction: Secondary | ICD-10-CM | POA: Diagnosis not present

## 2023-12-18 DIAGNOSIS — Z951 Presence of aortocoronary bypass graft: Secondary | ICD-10-CM | POA: Diagnosis not present

## 2023-12-18 DIAGNOSIS — I483 Typical atrial flutter: Secondary | ICD-10-CM | POA: Diagnosis not present

## 2023-12-18 DIAGNOSIS — I447 Left bundle-branch block, unspecified: Secondary | ICD-10-CM | POA: Diagnosis not present

## 2023-12-18 DIAGNOSIS — Z950 Presence of cardiac pacemaker: Secondary | ICD-10-CM | POA: Diagnosis not present

## 2023-12-18 DIAGNOSIS — I10 Essential (primary) hypertension: Secondary | ICD-10-CM | POA: Diagnosis not present

## 2023-12-18 DIAGNOSIS — I442 Atrioventricular block, complete: Secondary | ICD-10-CM | POA: Diagnosis not present

## 2023-12-18 DIAGNOSIS — I2510 Atherosclerotic heart disease of native coronary artery without angina pectoris: Secondary | ICD-10-CM | POA: Diagnosis not present

## 2023-12-19 DIAGNOSIS — I442 Atrioventricular block, complete: Secondary | ICD-10-CM | POA: Diagnosis not present

## 2023-12-19 DIAGNOSIS — Z950 Presence of cardiac pacemaker: Secondary | ICD-10-CM | POA: Diagnosis not present

## 2023-12-21 ENCOUNTER — Encounter: Payer: Self-pay | Admitting: Internal Medicine

## 2023-12-21 ENCOUNTER — Inpatient Hospital Stay: Attending: Oncology | Admitting: Internal Medicine

## 2023-12-21 VITALS — BP 123/90 | HR 68 | Temp 97.8°F | Resp 19 | Wt 248.0 lb

## 2023-12-21 DIAGNOSIS — Z809 Family history of malignant neoplasm, unspecified: Secondary | ICD-10-CM | POA: Insufficient documentation

## 2023-12-21 DIAGNOSIS — Z85820 Personal history of malignant melanoma of skin: Secondary | ICD-10-CM | POA: Insufficient documentation

## 2023-12-21 DIAGNOSIS — R569 Unspecified convulsions: Secondary | ICD-10-CM | POA: Diagnosis present

## 2023-12-21 DIAGNOSIS — G40909 Epilepsy, unspecified, not intractable, without status epilepticus: Secondary | ICD-10-CM | POA: Diagnosis not present

## 2023-12-21 MED ORDER — CLONAZEPAM 0.5 MG PO TABS: ORAL_TABLET | ORAL | 1 refills | Status: AC

## 2023-12-21 NOTE — Progress Notes (Signed)
 [facility] at [facility] [address]  [place], [place] [address] [phone]   Interval Evaluation  Date of Service: 12/21/23 Patient Name: Edward Pitts Patient MRN: [id number] Patient DOB: 15-May-1960 Provider: Arthea MARLA Manns, MD  Identifying Statement:  Edward Pitts is a 63 y.o. male with suspected seizures  Primary Cancer:  Oncologic History: Oncology History  Malignant melanoma of overlapping sites [facility])  04/16/2019 Cancer Staging   Staging form: Melanoma of the Skin, AJCC 8th Edition - Pathologic: Stage Unknown (rpTX, pN1b, cM0) - Signed by Babara Call, MD on 07/27/2020 Stage prefix: Recurrence    04/23/2019 Initial Diagnosis   Malignant melanoma   -He has a history of left lower extremity melanoma in 2011, status post local excision -04/16/2019 patient underwent left groin mass resection  Resection pathology showed malignant melanoma, replacing a lymph node, with extracapsular extension, peripheral and deep margins involved.  Left inguinal contents, all 7 lymph nodes were negative for melanoma in the lymph nodes. Extranodal melanoma identified in lymphatic and interstitium between nodes -PDL1 80% TPS    07/07/2019 -  Radiation Therapy   status post adjuvant radiation.   07/23/2019 - 07/21/2021 Chemotherapy   Nivolumab  q14d      06/29/2020 Imaging   CT chest abdomen pelvis showed stable postoperative appearance of the left groin.  No evidence of local recurrence.  No evidence of metastatic disease in the chest abdomen or pelvis.  Hepatic steatosis.  Stable subcentimeter fluid attenuation lesion of the lateral right lobe of the liver, likely benign cyst or hemangioma.  Coronary artery disease.  Aortic atherosclerosis   10/20/2020 Imaging   CT chest abdomen pelvis showed stable postoperative/radiation appearance of the left groin.  No evidence of local recurrence/metastatic disease within the chest abdomen/pelvis.  Fatty liver disease.  Diverticulosis  without evidence of typhlitis.  Aortic atherosclerosis   03/10/2021 Imaging   MRI brain without contrast showed no definitive evidence of intracranial metastatic disease.  Study is limited by absence of intravenous contrast.     04/26/2021 Imaging   CT chest abdomen pelvis without contrast showed stable post operative changes of left groin with no evidence of recurrent disease.  No evidence of metastatic disease in the chest abdomen pelvis.  Aortic atherosclerosis   08/08/2021 - 08/16/2021 Hospital Admission    patient was hospitalized due to NSTEMI status post CABG x3.  He also had pacemaker The echocardiogram showed left ventricular ejection fraction of 65 to 70%,   09/15/2021 Imaging   CT chest abdomen pelvis w contrast  IMPRESSION: 1. Subtle hypodense 9 mm lesion in the left lobe of the liver is new from prior imaging including previous contrasted CT dating back to December 10, 2019, with the lesion appearing to equilibrate with background liver on delayed imaging sequence but is incompletely evaluated on this imaging study and technically nonspecific possibly reflecting a benign perfusional variant and while its appearance is not typical for that of a melanoma metastasis, it is not excluded on this examination. Suggest more definitive characterization by hepatic protocol MRI with and without contrast. 2. Stable postoperative changes in the left groin without evidence of local recurrent disease.3. No evidence of metastatic disease in the chest or pelvis.4.  Aortic Atherosclerosis (ICD10-I70.0).      09/23/2021 Imaging   Contrast-enhanced liver ultrasound Mildly hypoenhancing 2.2 cm mass in the posterior aspect of the left lobe of the liver with washout characteristics concerning for non hepatocellular malignancy, concerning for melanotic metastasis given history. The lesion  is in an unfavorable location for percutaneous biopsy. Consider PET-CT for further characterization   10/21/2021 -   Chemotherapy   Nivolumab  q14d      11/10/2021 Imaging   MRI Brain w wo contrast Negative for metastatic disease to the brain   11/10/2021 Imaging   MRI abdomen w wo contrast 1. Lesion of the posterior superior left lobe of the liver, hepatic segment II, measuring 1.9 x 1.8 cm corresponding to findings of prior imaging. Evaluation is somewhat limited breath motion artifact however there is subtle associated rim enhancement of this lesion. Findings are most in keeping with a hepatic metastasis in the setting of known recurrent melanoma. 2. Mild hepatic steatosis. 3. Cardiomegaly.    02/27/2022 - 02/27/2022 Chemotherapy   Patient is on Treatment Plan : Capecitabine  (825 mg/m2 bid) + XRT     07/12/2022 -  Chemotherapy   Patient is on Treatment Plan : PANCREAS Gemcitabine  D1,8(1000) q21d     Cholangiocarcinoma (HCC)  12/19/2021 Initial Diagnosis   Liver biopsy showed carcinoma  -The slides on the patient's prior left inguinal lymph node biopsy ([id number]) with metastatic melanoma were reviewed in conjunction with  this case. The morphology of the malignant cells between the two cases are dissimilar. A limited panel of immunohistochemical stains was performed and the neoplastic cells are positive for superpancytokeratin, cytokeratin 7 (diffuse, strong), cytokeratin 20 (patchy, moderate) and negative for S100, SOX-10, and HepPar-1.These findings are consistent with carcinoma. The morphologic findings and pattern of immunohistochemical staining are non-specific and possible sites of origin include but are not limited to pancreaticobiliary tract, GI tract, prostate, kidney, lung, and breast. Per [facility], the patient had a PET scan performed in July 2023 which demonstrated a single hepatic  hypermetabolic lesion without other sites of disease.    01/19/2022 Surgery   Liver lesion resection at [facility] by Dr.Zani  A.  Liver, segment 2, 3, 4A, partial hepatectomy:   Cholangiocarcinoma, moderately differentiated  (2.5 cm, segment 2).  Tumor extends to hepatic parenchymal margin, but all other margins are negative for tumor. See synoptic report and comment.   Background liver with: Mild steatosis (20%).  No definitive evidence of steatohepatitis.   Mild periportal fibrosis (trichrome and reticulin). No stainable iron  (Prussian blue stain).  No evidence to support alpha-1-antitrypsin deficiency on PAS-D stain.   pT1a pNx   01/25/2022 Cancer Staging   Staging form: Intrahepatic Bile Duct, AJCC 8th Edition - Pathologic stage from 01/25/2022: Stage Unknown (pT1a, pNX, cM0) - Signed by Babara Call, MD on 02/21/2022 Stage prefix: Initial diagnosis   03/23/2022 - 05/01/2022 Radiation Therapy   Concurrent Xeloda  825mg /m2 BID + Radiation.    05/08/2022 -  Chemotherapy   Xeloda  [1000 mg/m2 BID for 14 of every 21 days], plan 4 months.   05/08/2022, cycle 1 Xeloda .  Xeloda  was stopped on 05/19/22 due to developing rash on face as well as dorsum of hands. Treated with steroid,symptoms resolved.  3/4-3/13/2024  Xeloda  was stopped early due to groin cellulitis, he also developed similar skin rash again  Plan 3/25 cycle 3 dose reduce Xeloda  825 mg/m2   2000mg  BID x 14 days.   05/09/2022 Imaging   CT chest abdomen pelvis with contrast at [facility] showed Status post partial left hepatectomy without CT evidence of recurrent or metastatic disease in the chest, abdomen, pelvis.     07/24/2022 - 07/26/2022 Hospital Admission   Patient was hospitalized due to symptomatic anemia, status post 1 unit of PRBC transfusion. Also neutropenia secondary to chemotherapy.  Nadir was 0.9.  Patient was afebrile.   09/05/2022 Imaging   CT chest abdomen pelvis with contrast at [facility]  Postsurgical changes of left hepatectomy without evidence of recurrent or  metastatic disease.    01/02/2023 Imaging   CT chest abdomen pelvis w contrast [facility] No evidence of recurrent or metastatic disease.    05/01/2023 Imaging   CT chest abdomen pelvis w  contrast at [facility] showed  Impression:  1. No findings of locally recurrent or metastatic disease within the  chest, abdomen or pelvis.  2.  Diffuse hepatic steatosis.     05/01/2023 Tumor Marker   CA 19.9   8 CEA 0.3     Interval History: Edward Pitts presents for clinical follow up after increasing Vimpat  in March.  He did have 1 breakthrough seizure over the past 3 months.  It was of typical semiology, seeing black spots followed by feeling like about to pass out.  This remains a decrease in frequency from prior.  Remains on imaging surveillance only with Dr. Babara.  Prior (06/08/23) Recently was hospitalized for syncope and afib. Continues to describe sporadic seizures, described as blurry vision, head spinning, confusion.  Frequency remains about once per month.  No longer on chemotherapy with Dr. Babara at this time.  Denies headaches.  H+P (04/15/21) Patient presents for follow up given neurologic complaints.  He has seen neurologist in [place] for several years, relevant history is obtained from review of prior records.  In short, he experiences paroxysmal episodes of head in a vice, disconnected, in a daze, can't understand language.  Episodes started in 2019, last 1-2 minutes, and occur 2-3x per month.  He thinks frequency decreased when Clonazepam  was started by Dr. Jenel in 2019.  He had normal EEG at that time.  Recently was given a trial of Keppra , but he experienced significant dizziness and stopped it.  Had MRI brain without contrast done recently through Dr. Babara.  Continues on nivolumab  infusions for melanoma.   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  Medications: Current Outpatient Medications on File Prior to Visit  Medication Sig Dispense Refill   apixaban  (ELIQUIS ) 5 MG TABS tablet Take 5 mg by mouth 2 (two) times daily.     aspirin  EC 81 MG tablet Take 81 mg by mouth  daily. Swallow whole.     atorvastatin  (LIPITOR) 20 MG tablet Take 20 mg by mouth daily.     Calcium -Vitamin D -Vitamin K 750-500-40 MG-UNT-MCG TABS Take 1 tablet by mouth daily.     clonazePAM  (KLONOPIN ) 0.5 MG tablet TAKE 1 TABLET BY MOUTH IN THE MORNING AND 1 BY MOUTH IN THE EVENING per pt 120 tablet 1   clotrimazole  (CLOTRIMAZOLE  ANTI-FUNGAL) 1 % cream Apply 1 Application topically 2 (two) times daily. Apply to the L inguinal fold 30 g 0   febuxostat  (ULORIC ) 40 MG tablet Take 40 mg by mouth daily.     hydrocortisone  cream 0.5 % Apply 1 Application topically.     lacosamide  (VIMPAT ) 200 MG TABS tablet TAKE 1 TABLET BY MOUTH TWICE A DAY 60 tablet 2   lidocaine -prilocaine  (EMLA ) cream Apply 1 Application topically as needed. Apply small amount of cream to port site approx 1-2 hours prior to appointment. 30 g 11   lisinopril  (ZESTRIL ) 10 MG tablet Take 10 mg by mouth daily.     metoprolol  succinate (TOPROL -XL) 25 MG 24 hr tablet Take 25 mg  by mouth daily.     miconazole (MICOTIN) 2 % powder Apply topically as needed for itching.     Midazolam  (NAYZILAM ) 5 MG/0.1ML SOLN Place 5 mg into the nose every 8 (eight) hours as needed (prolonged seizure). 1 each 0   Multiple Vitamin (MULTIVITAMIN WITH MINERALS) TABS tablet Take 1 tablet by mouth daily. Centrum Silver      nystatin  (MYCOSTATIN /NYSTOP ) powder Apply 1 Application topically 3 (three) times daily. (Patient taking differently: Apply 1 Application topically 3 (three) times daily as needed (irritation).) 60 g 1   omeprazole  (PRILOSEC) 20 MG capsule TAKE 1 CAPSULE BY MOUTH EVERY DAY (Patient not taking: Reported on 11/08/2023) 90 capsule 0   ondansetron  (ZOFRAN ) 4 MG tablet TAKE 1 TABLET BY MOUTH EVERY 8 HOURS AS NEEDED FOR NAUSEA AND VOMITING 90 tablet 1   prochlorperazine  (COMPAZINE ) 10 MG tablet Take 1 tablet (10 mg total) by mouth every 6 (six) hours as needed for nausea or vomiting. (Patient not taking: Reported on 11/08/2023) 30 tablet 0    Propylene Glycol (SYSTANE BALANCE) 0.6 % SOLN Place 1 drop into both eyes as needed (dry eyes).     traZODone  (DESYREL ) 50 MG tablet Take 50 mg by mouth at bedtime as needed for sleep.     Current Facility-Administered Medications on File Prior to Visit  Medication Dose Route Frequency Provider Last Rate Last Admin   heparin  lock flush 100 UNIT/ML injection            heparin  lock flush 100 UNIT/ML injection            heparin  lock flush 100 unit/mL  500 Units Intravenous Once Yu, Zhou, MD       sodium chloride  flush (NS) 0.9 % injection 10 mL  10 mL Intravenous PRN Babara Call, MD        Allergies:  Allergies  Allergen Reactions   Hydrochlorothiazide  Other (See Comments)   Ibuprofen  Other (See Comments)    Affects kidneys   Levetiracetam  Hives, Rash and Dermatitis    Other reaction(s): Dizziness, Headache   Nsaids     Affects kidneys   Capecitabine  Rash and Dermatitis    Other Reaction(s): Eruption of skin   Past Medical History:  Past Medical History:  Diagnosis Date   AKI (acute kidney injury) 08/24/2018   Anemia    iron  treatments   Anxiety    Aortic atherosclerosis    Arthritis    Cancer of groin (HCC) 2021   left groin, resected, radiation   Cataract    Complication of anesthesia    PONV   Coronary artery disease    Dizziness 12/22/2019   Dizziness of unknown etiology    has led to seizures and passing out.   Elevated serum creatinine 04/15/2021   Exposure to combat 04/20/2020   Exposure to potentially hazardous substance 09/18/2022   Sep 11, 2022 Entered By: ROSANA DRAGON Comment: Entered automatically through [facility] Problem List documentation program     Family history of adverse reaction to anesthesia    PONV mother   GERD (gastroesophageal reflux disease)    H/O Malignant melanoma 09/18/2022   History of complete heart block    PPM placed   Hyperlipidemia    Hypertension    LBBB (left bundle branch block)    Lymphedema of left leg    uses thigh high  compression stockings   Melanoma (HCC) 2012   skin cancer, left thigh   OSA on CPAP    PONV (postoperative nausea and vomiting)  04/16/2019   Port-A-Cath in place    RIGHT chest wall   Presence of cardiac pacemaker    [employer]   Problem related to unspecified psychosocial circumstances 09/18/2022   Seizures (HCC)    still has episodes of dizziness. last event 1 month ago (march 2022) and will pass out. takes clonazepam    Past Surgical History:  Past Surgical History:  Procedure Laterality Date   CARDIOVERSION N/A 05/15/2023   Procedure: CARDIOVERSION;  Surgeon: Ammon Blunt, MD;  Location: [facility] ORS;  Service: Cardiovascular;  Laterality: N/A;   CORONARY ARTERY BYPASS GRAFT N/A 08/10/2021   Procedure: CORONARY ARTERY BYPASS GRAFTING (CABG) X 3 USING LEFT INTERNAL MAMMARY ARTERY AND RIGHT GREATER SAPHENOUS VEIN;  Surgeon: Obadiah Coy, MD;  Location: [facility] OR;  Service: Open Heart Surgery;  Laterality: N/A;   CT RADIATION THERAPY GUIDE     left groin   dental implant     permanent implant   ENDOVEIN HARVEST OF GREATER SAPHENOUS VEIN Right 08/10/2021   Procedure: ENDOVEIN HARVEST OF GREATER SAPHENOUS VEIN;  Surgeon: Obadiah Coy, MD;  Location: [facility] OR;  Service: Open Heart Surgery;  Laterality: Right;   KNEE SURGERY Left    arthroscopy   LEFT HEART CATH AND CORONARY ANGIOGRAPHY Left 06/29/2017   Procedure: LEFT HEART CATH AND CORONARY ANGIOGRAPHY;  Surgeon: Hester Wolm PARAS, MD;  Location: [facility] INVASIVE CV LAB;  Service: Cardiovascular;  Laterality: Left;   LEFT HEART CATH AND CORONARY ANGIOGRAPHY N/A 08/08/2021   Procedure: LEFT HEART CATH AND CORONARY ANGIOGRAPHY;  Surgeon: Hester Wolm PARAS, MD;  Location: [facility] INVASIVE CV LAB;  Service: Cardiovascular;  Laterality: N/A;   LYMPH NODE DISSECTION Left 04/16/2019   Procedure: Left inguinal Lymph Node Dissection;  Surgeon: Aron Shoulders, MD;  Location: [facility] OR;  Service: General;  Laterality: Left;   MELANOMA EXCISION Left 04/16/2019    Procedure: MELANOMA EXCISION LEFT GROIN MASS;  Surgeon: Aron Shoulders, MD;  Location: [facility] OR;  Service: General;  Laterality: Left;   MELANOMA EXCISION WITH SENTINEL LYMPH NODE BIOPSY Left 2012   Left calf    PACEMAKER INSERTION N/A 08/26/2018   Procedure: INSERTION PACEMAKER;  Surgeon: Ammon Blunt, MD;  Location: [facility] ORS;  Service: Cardiovascular;  Laterality: N/A;   PORTA CATH INSERTION N/A 08/26/2019   Procedure: PORTA CATH INSERTION;  Surgeon: Jama Cordella MATSU, MD;  Location: [facility] INVASIVE CV LAB;  Service: Cardiovascular;  Laterality: N/A;   SUPERFICIAL LYMPH NODE BIOPSY / EXCISION Left 2020   lymph nodes removed around left groin melanoma site   TEE WITHOUT CARDIOVERSION N/A 08/10/2021   Procedure: TRANSESOPHAGEAL ECHOCARDIOGRAM (TEE);  Surgeon: Obadiah Coy, MD;  Location: [facility] OR;  Service: Open Heart Surgery;  Laterality: N/A;   TEMPORARY PACEMAKER N/A 08/25/2018   Procedure: TEMPORARY PACEMAKER;  Surgeon: Wonda Sharper, MD;  Location: [facility] INVASIVE CV LAB;  Service: Cardiovascular;  Laterality: N/A;   TOTAL HIP ARTHROPLASTY Left 07/14/2020   Procedure: TOTAL HIP ARTHROPLASTY;  Surgeon: Mardee Lynwood SQUIBB, MD;  Location: [facility] ORS;  Service: Orthopedics;  Laterality: Left;   Social History:  Social History   Socioeconomic History   Marital status: Married    Spouse name: Therisa    Number of children: 7   Years of education: 12   Highest education level: Not on file  Occupational History    Comment: disability  Tobacco Use   Smoking status: Never   Smokeless tobacco: Never  Vaping Use   Vaping status: Never Used  Substance and Sexual Activity   Alcohol   use: No   Drug use: No   Sexual activity: Not Currently  Other Topics Concern   Not on file  Social History Narrative   Lives with  Wife,   Has 2 small dogs   Caffeine use: sodas (2 per day)      Out of work on disability.  Has a walk in shower. No stairs to climb   Oncology treatment ongoing. Uses port a cath  for treatment.      pacemaker   Social Drivers of Health   Financial Resource Strain: Low Risk  (02/12/2019)   Overall Financial Resource Strain (CARDIA)    Difficulty of Paying Living Expenses: Not hard at all  Food Insecurity: No Food Insecurity (03/15/2023)   Hunger Vital Sign    Worried About Running Out of Food in the Last Year: Never true    Ran Out of Food in the Last Year: Never true  Transportation Needs: No Transportation Needs (03/15/2023)   PRAPARE - Administrator, Civil Service (Medical): No    Lack of Transportation (Non-Medical): No  Physical Activity: Inactive (03/15/2023)   Exercise Vital Sign    Days of Exercise per Week: 0 days    Minutes of Exercise per Session: 0 min  Stress: No Stress Concern Present (03/15/2023)   [employer] of Occupational Health - Occupational Stress Questionnaire    Feeling of Stress : Not at all  Social Connections: Unknown (02/12/2019)   Social Connection and Isolation Panel    Frequency of Communication with Friends and Family: More than three times a week    Frequency of Social Gatherings with Friends and Family: Not on file    Attends Religious Services: Not on file    Active Member of Clubs or Organizations: Not on file    Attends Banker Meetings: Not on file    Marital Status: Married  Intimate Partner Violence: Not At Risk (03/15/2023)   Humiliation, Afraid, Rape, and Kick questionnaire    Fear of Current or Ex-Partner: No    Emotionally Abused: No    Physically Abused: No    Sexually Abused: No   Family History:  Family History  Problem Relation Age of Onset   Cancer Paternal Grandmother     Review of Systems: Constitutional: Doesn't report fevers, chills or abnormal weight loss Eyes: Doesn't report blurriness of vision Ears, nose, mouth, throat, and face: Doesn't report sore throat Respiratory: Doesn't report cough, dyspnea or wheezes Cardiovascular: Doesn't report palpitation,  chest discomfort  Gastrointestinal:  Doesn't report nausea, constipation, diarrhea GU: Doesn't report incontinence Skin: Doesn't report skin rashes Neurological: Per HPI Musculoskeletal: Doesn't report joint pain Behavioral/Psych: Doesn't report anxiety  Physical Exam: Vitals:   12/21/23 0948  BP: (!) 123/90  Pulse: 68  Resp: 19  Temp: 97.8 F (36.6 C)  SpO2: 97%    KPS: 90. General: Alert, cooperative, pleasant, in no acute distress Head: Normal EENT: No conjunctival injection or scleral icterus.  Lungs: Resp effort normal Cardiac: Regular rate Abdomen: Non-distended abdomen Skin: erythematous rash, bilateral hands Extremities: No clubbing or edema  Neurologic Exam: Mental Status: Awake, alert, attentive to examiner. Oriented to self and environment. Language is fluent with intact comprehension.  Cranial Nerves: Visual acuity is grossly normal. Visual fields are full. Extra-ocular movements intact. No ptosis. Face is symmetric Motor: Tone and bulk are normal. Power is full in both arms and legs. Reflexes are symmetric, no pathologic reflexes present.  Sensory: Intact to light touch Gait: Normal.  Labs: I have reviewed the data as listed    Component Value Date/Time   NA 140 10/26/2023 1058   K 5.3 (H) 10/26/2023 1058   CL 104 10/26/2023 1058   CO2 22 10/26/2023 1058   GLUCOSE 96 10/26/2023 1058   GLUCOSE 135 (H) 08/20/2023 1046   BUN 22 10/26/2023 1058   CREATININE 1.28 (H) 10/26/2023 1058   CREATININE 1.06 11/08/2022 0848   CALCIUM  10.2 10/26/2023 1058   PROT 7.4 10/26/2023 1058   ALBUMIN  4.4 10/26/2023 1058   AST 50 (H) 10/26/2023 1058   AST 49 (H) 11/08/2022 0848   ALT 68 (H) 10/26/2023 1058   ALT 81 (H) 11/08/2022 0848   ALKPHOS 83 10/26/2023 1058   BILITOT 0.3 10/26/2023 1058   BILITOT 0.4 11/08/2022 0848   GFRNONAA >60 08/20/2023 1046   GFRNONAA >60 11/08/2022 0848   GFRAA >60 12/25/2019 0905   Lab Results  Component Value Date   WBC 4.0  10/26/2023   NEUTROABS 2.6 10/26/2023   HGB 12.1 (L) 10/26/2023   HCT 37.6 10/26/2023   MCV 93 10/26/2023   PLT 169 10/26/2023    Assessment/Plan Seizure disorder (HCC)  Edward Pitts is clinically stable today.  One breakthrough seizure over 3 months is an improvement in frequency and seizure buren overall.  Recommended continuing Vimpat  200mg  BID.  Clonazepam  0.5/0.5 will also remain for seizure prevention.  He is agreeable with this.  Also recommended he keep on board Nayzilam  5mg  spray for prolonged seizure greater than 5 minutes, or seizure cluster.  We appreciate the opportunity to participate in the care of Edward Pitts.  He will follow up in clinic in 6 months or sooner if needed.  All questions were answered. The patient knows to call the clinic with any problems, questions or concerns. No barriers to learning were detected.  The total time spent in the encounter was 30 minutes and more than 50% was on counseling and review of test results   Arthea MARLA Manns, MD Medical Director of Neuro-Oncology [facility] at [facility] 12/21/23 9:47 AM

## 2023-12-22 ENCOUNTER — Ambulatory Visit: Payer: Self-pay | Admitting: Family

## 2023-12-23 ENCOUNTER — Other Ambulatory Visit: Payer: Self-pay

## 2023-12-28 ENCOUNTER — Inpatient Hospital Stay: Attending: Oncology

## 2023-12-28 DIAGNOSIS — C438 Malignant melanoma of overlapping sites of skin: Secondary | ICD-10-CM | POA: Insufficient documentation

## 2023-12-28 DIAGNOSIS — Z452 Encounter for adjustment and management of vascular access device: Secondary | ICD-10-CM | POA: Diagnosis not present

## 2024-01-01 DIAGNOSIS — K760 Fatty (change of) liver, not elsewhere classified: Secondary | ICD-10-CM | POA: Diagnosis not present

## 2024-01-01 DIAGNOSIS — C221 Intrahepatic bile duct carcinoma: Secondary | ICD-10-CM | POA: Diagnosis not present

## 2024-02-08 ENCOUNTER — Telehealth: Payer: Self-pay

## 2024-02-08 NOTE — Telephone Encounter (Signed)
 Pt called to confirm receipt of appt  info.

## 2024-02-08 NOTE — Telephone Encounter (Signed)
 Patient is concerned with nausea that is similar to nausea prior to initial cancer diagnosis.  Wants to know if he can see Dr. Babara sooner that already scheduled 03/10/24 routine visit.

## 2024-02-11 ENCOUNTER — Other Ambulatory Visit: Payer: Self-pay | Admitting: *Deleted

## 2024-02-11 DIAGNOSIS — N1831 Chronic kidney disease, stage 3a: Secondary | ICD-10-CM

## 2024-02-11 DIAGNOSIS — C221 Intrahepatic bile duct carcinoma: Secondary | ICD-10-CM

## 2024-02-12 ENCOUNTER — Inpatient Hospital Stay: Attending: Oncology

## 2024-02-12 DIAGNOSIS — Z79899 Other long term (current) drug therapy: Secondary | ICD-10-CM | POA: Insufficient documentation

## 2024-02-12 DIAGNOSIS — K760 Fatty (change of) liver, not elsewhere classified: Secondary | ICD-10-CM | POA: Insufficient documentation

## 2024-02-12 DIAGNOSIS — Z950 Presence of cardiac pacemaker: Secondary | ICD-10-CM | POA: Insufficient documentation

## 2024-02-12 DIAGNOSIS — K5730 Diverticulosis of large intestine without perforation or abscess without bleeding: Secondary | ICD-10-CM | POA: Insufficient documentation

## 2024-02-12 DIAGNOSIS — I2510 Atherosclerotic heart disease of native coronary artery without angina pectoris: Secondary | ICD-10-CM | POA: Insufficient documentation

## 2024-02-12 DIAGNOSIS — C221 Intrahepatic bile duct carcinoma: Secondary | ICD-10-CM

## 2024-02-12 DIAGNOSIS — I700 Atherosclerosis of aorta: Secondary | ICD-10-CM | POA: Insufficient documentation

## 2024-02-12 DIAGNOSIS — E785 Hyperlipidemia, unspecified: Secondary | ICD-10-CM | POA: Insufficient documentation

## 2024-02-12 DIAGNOSIS — Z8505 Personal history of malignant neoplasm of liver: Secondary | ICD-10-CM | POA: Insufficient documentation

## 2024-02-12 DIAGNOSIS — D631 Anemia in chronic kidney disease: Secondary | ICD-10-CM | POA: Insufficient documentation

## 2024-02-12 DIAGNOSIS — K30 Functional dyspepsia: Secondary | ICD-10-CM | POA: Insufficient documentation

## 2024-02-12 DIAGNOSIS — N1831 Chronic kidney disease, stage 3a: Secondary | ICD-10-CM | POA: Insufficient documentation

## 2024-02-12 DIAGNOSIS — Z7901 Long term (current) use of anticoagulants: Secondary | ICD-10-CM | POA: Insufficient documentation

## 2024-02-12 DIAGNOSIS — I252 Old myocardial infarction: Secondary | ICD-10-CM | POA: Insufficient documentation

## 2024-02-12 DIAGNOSIS — I1310 Hypertensive heart and chronic kidney disease without heart failure, with stage 1 through stage 4 chronic kidney disease, or unspecified chronic kidney disease: Secondary | ICD-10-CM | POA: Insufficient documentation

## 2024-02-12 DIAGNOSIS — Z85820 Personal history of malignant melanoma of skin: Secondary | ICD-10-CM | POA: Insufficient documentation

## 2024-02-12 DIAGNOSIS — Z08 Encounter for follow-up examination after completed treatment for malignant neoplasm: Secondary | ICD-10-CM | POA: Insufficient documentation

## 2024-02-12 LAB — CBC WITH DIFFERENTIAL (CANCER CENTER ONLY)
Abs Immature Granulocytes: 0.06 K/uL (ref 0.00–0.07)
Basophils Absolute: 0 K/uL (ref 0.0–0.1)
Basophils Relative: 1 %
Eosinophils Absolute: 0.1 K/uL (ref 0.0–0.5)
Eosinophils Relative: 3 %
HCT: 32.9 % — ABNORMAL LOW (ref 39.0–52.0)
Hemoglobin: 11.4 g/dL — ABNORMAL LOW (ref 13.0–17.0)
Immature Granulocytes: 1 %
Lymphocytes Relative: 20 %
Lymphs Abs: 0.8 K/uL (ref 0.7–4.0)
MCH: 30.9 pg (ref 26.0–34.0)
MCHC: 34.7 g/dL (ref 30.0–36.0)
MCV: 89.2 fL (ref 80.0–100.0)
Monocytes Absolute: 0.4 K/uL (ref 0.1–1.0)
Monocytes Relative: 9 %
Neutro Abs: 2.8 K/uL (ref 1.7–7.7)
Neutrophils Relative %: 66 %
Platelet Count: 136 K/uL — ABNORMAL LOW (ref 150–400)
RBC: 3.69 MIL/uL — ABNORMAL LOW (ref 4.22–5.81)
RDW: 12.8 % (ref 11.5–15.5)
WBC Count: 4.3 K/uL (ref 4.0–10.5)
nRBC: 0 % (ref 0.0–0.2)

## 2024-02-12 LAB — CMP (CANCER CENTER ONLY)
ALT: 53 U/L — ABNORMAL HIGH (ref 0–44)
AST: 41 U/L (ref 15–41)
Albumin: 4 g/dL (ref 3.5–5.0)
Alkaline Phosphatase: 66 U/L (ref 38–126)
Anion gap: 10 (ref 5–15)
BUN: 27 mg/dL — ABNORMAL HIGH (ref 8–23)
CO2: 21 mmol/L — ABNORMAL LOW (ref 22–32)
Calcium: 9.1 mg/dL (ref 8.9–10.3)
Chloride: 104 mmol/L (ref 98–111)
Creatinine: 1.21 mg/dL (ref 0.61–1.24)
GFR, Estimated: >60 mL/min (ref >60)
Glucose, Bld: 177 mg/dL — ABNORMAL HIGH (ref 70–99)
Potassium: 4.1 mmol/L (ref 3.5–5.1)
Sodium: 135 mmol/L (ref 135–145)
Total Bilirubin: 0.6 mg/dL (ref 0.0–1.2)
Total Protein: 7.5 g/dL (ref 6.5–8.1)

## 2024-02-12 LAB — IRON AND TIBC
Iron: 97 ug/dL (ref 45–182)
Saturation Ratios: 30 % (ref 17.9–39.5)
TIBC: 323 ug/dL (ref 250–450)
UIBC: 227 ug/dL

## 2024-02-12 LAB — FERRITIN: Ferritin: 208 ng/mL (ref 24–336)

## 2024-02-13 LAB — CEA: CEA: 0.7 ng/mL (ref 0.0–4.7)

## 2024-02-13 LAB — CANCER ANTIGEN 19-9: CA 19-9: 8 U/mL (ref 0–35)

## 2024-02-14 ENCOUNTER — Inpatient Hospital Stay (HOSPITAL_BASED_OUTPATIENT_CLINIC_OR_DEPARTMENT_OTHER): Admitting: Oncology

## 2024-02-14 ENCOUNTER — Encounter: Payer: Self-pay | Admitting: Oncology

## 2024-02-14 VITALS — BP 127/62 | HR 85 | Temp 97.4°F | Resp 18 | Wt 248.0 lb

## 2024-02-14 DIAGNOSIS — R42 Dizziness and giddiness: Secondary | ICD-10-CM

## 2024-02-14 DIAGNOSIS — Z95828 Presence of other vascular implants and grafts: Secondary | ICD-10-CM | POA: Diagnosis not present

## 2024-02-14 DIAGNOSIS — C438 Malignant melanoma of overlapping sites of skin: Secondary | ICD-10-CM

## 2024-02-14 DIAGNOSIS — C221 Intrahepatic bile duct carcinoma: Secondary | ICD-10-CM

## 2024-02-14 DIAGNOSIS — N1832 Chronic kidney disease, stage 3b: Secondary | ICD-10-CM

## 2024-02-14 DIAGNOSIS — D631 Anemia in chronic kidney disease: Secondary | ICD-10-CM

## 2024-02-14 DIAGNOSIS — N1831 Chronic kidney disease, stage 3a: Secondary | ICD-10-CM

## 2024-02-14 DIAGNOSIS — Z08 Encounter for follow-up examination after completed treatment for malignant neoplasm: Secondary | ICD-10-CM | POA: Diagnosis not present

## 2024-02-14 MED ORDER — OMEPRAZOLE 20 MG PO CPDR
20.0000 mg | DELAYED_RELEASE_CAPSULE | Freq: Every day | ORAL | 1 refills | Status: AC
Start: 2024-02-14 — End: ?

## 2024-02-14 NOTE — Assessment & Plan Note (Signed)
 Patient has no radiographic evidence of metastatic melanoma on recent CT scan done at [facility] immunotherapy. Continue surveillance

## 2024-02-14 NOTE — Assessment & Plan Note (Signed)
 Encourage oral hydration and avoid nephrotoxins.

## 2024-02-14 NOTE — Progress Notes (Signed)
 Hematology/Oncology Progress note [phone] Fax:([phone]) [phone]    CHIEF COMPLAINTS/REASON FOR VISIT:  Follow up for melanoma, cholangiocarcinoma.    ASSESSMENT & PLAN:   Cancer Staging  Cholangiocarcinoma [facility]) Staging form: Intrahepatic Bile Duct, AJCC 8th Edition - Pathologic stage from 01/25/2022: Stage Unknown (pT1a, pNX, cM0) - Signed by Babara Call, MD on 02/21/2022  Malignant melanoma of overlapping sites [facility]) Staging form: Melanoma of the Skin, AJCC 8th Edition - Pathologic: Stage Unknown (rpTX, rpN1b, rcM0) - Signed by Babara Call, MD on 07/27/2020 - Pathologic: No stage assigned - Unsigned   Cholangiocarcinoma (HCC) T1a Nx Cholangiocarcinoma Pathology was reviewed and discussed with patient.  S/p chemotherapy Xeloda  825mg /m2 BID+ concurrent RT Previously on adjuvant Xeloda  [1000 mg/m2 BID for 14 of every 21 days] -dose reduced to 825mg /m2 BID due to skin rash, still not able to tolerate- discontinue.--> s/p 6 cycles gemcitabine  weekly, 2 weeks on, 1 week off, [adjustment due to cytopenia]  Labs are reviewed and discussed with patient.  October 2025  surveillance CT scan  at [facility]- no recurrence. Tumor markers stable.  Continue surveillance.    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  Recommend patient to resume oral iron  supplementation once daily.   Malignant melanoma of overlapping sites [facility]) Patient has no radiographic evidence of metastatic melanoma on recent CT scan done at [facility] immunotherapy. Continue surveillance  Port-A-Cath in place Port flush every 6 to 8 weeks.  Stage 3a chronic kidney disease (HCC) Encourage oral hydration and avoid nephrotoxins.      Dizziness 1 episode of dizziness and fatigue.  Symptom has resolved. I suspect the patient may have had dehydration. Encourage patient to increase oral hydration.  If symptom recurs, despite good hydration, recommend patient to go to emergency  room for further evaluation. Follow up in 6months.  All questions were answered. The patient knows to call the clinic with any problems, questions or concerns.  Call Babara, MD, PhD [facility] Hematology Oncology 02/14/2024    HISTORY OF PRESENTING ILLNESS:   Edward Pitts is a  63 y.o.  male presents for recurrent malignant melanoma.   Oncology History  Malignant melanoma of overlapping sites [facility])  04/16/2019 Cancer Staging   Staging form: Melanoma of the Skin, AJCC 8th Edition - Pathologic: Stage Unknown (rpTX, pN1b, cM0) - Signed by Babara Call, MD on 07/27/2020 Stage prefix: Recurrence    04/23/2019 Initial Diagnosis   Malignant melanoma   -He has a history of left lower extremity melanoma in 2011, status post local excision -04/16/2019 patient underwent left groin mass resection  Resection pathology showed malignant melanoma, replacing a lymph node, with extracapsular extension, peripheral and deep margins involved.  Left inguinal contents, all 7 lymph nodes were negative for melanoma in the lymph nodes. Extranodal melanoma identified in lymphatic and interstitium between nodes -PDL1 80% TPS    07/07/2019 -  Radiation Therapy   status post adjuvant radiation.   07/23/2019 - 07/21/2021 Chemotherapy   Nivolumab  q14d      06/29/2020 Imaging   CT chest abdomen pelvis showed stable postoperative appearance of the left groin.  No evidence of local recurrence.  No evidence of metastatic disease in the chest abdomen or pelvis.  Hepatic steatosis.  Stable subcentimeter fluid attenuation lesion of the lateral right lobe of the liver, likely benign cyst or hemangioma.  Coronary artery disease.  Aortic atherosclerosis   10/20/2020 Imaging   CT chest abdomen pelvis showed stable postoperative/radiation appearance of the  left groin.  No evidence of local recurrence/metastatic disease within the chest abdomen/pelvis.  Fatty liver disease.  Diverticulosis without evidence of typhlitis.  Aortic  atherosclerosis   03/10/2021 Imaging   MRI brain without contrast showed no definitive evidence of intracranial metastatic disease.  Study is limited by absence of intravenous contrast.     04/26/2021 Imaging   CT chest abdomen pelvis without contrast showed stable post operative changes of left groin with no evidence of recurrent disease.  No evidence of metastatic disease in the chest abdomen pelvis.  Aortic atherosclerosis   08/08/2021 - 08/16/2021 Hospital Admission    patient was hospitalized due to NSTEMI status post CABG x3.  He also had pacemaker The echocardiogram showed left ventricular ejection fraction of 65 to 70%,   09/15/2021 Imaging   CT chest abdomen pelvis w contrast  IMPRESSION: 1. Subtle hypodense 9 mm lesion in the left lobe of the liver is new from prior imaging including previous contrasted CT dating back to December 10, 2019, with the lesion appearing to equilibrate with background liver on delayed imaging sequence but is incompletely evaluated on this imaging study and technically nonspecific possibly reflecting a benign perfusional variant and while its appearance is not typical for that of a melanoma metastasis, it is not excluded on this examination. Suggest more definitive characterization by hepatic protocol MRI with and without contrast. 2. Stable postoperative changes in the left groin without evidence of local recurrent disease.3. No evidence of metastatic disease in the chest or pelvis.4.  Aortic Atherosclerosis (ICD10-I70.0).      09/23/2021 Imaging   Contrast-enhanced liver ultrasound Mildly hypoenhancing 2.2 cm mass in the posterior aspect of the left lobe of the liver with washout characteristics concerning for non hepatocellular malignancy, concerning for melanotic metastasis given history. The lesion is in an unfavorable location for percutaneous biopsy. Consider PET-CT for further characterization   10/21/2021 -  Chemotherapy   Nivolumab  q14d       11/10/2021 Imaging   MRI Brain w wo contrast Negative for metastatic disease to the brain   11/10/2021 Imaging   MRI abdomen w wo contrast 1. Lesion of the posterior superior left lobe of the liver, hepatic segment II, measuring 1.9 x 1.8 cm corresponding to findings of prior imaging. Evaluation is somewhat limited breath motion artifact however there is subtle associated rim enhancement of this lesion. Findings are most in keeping with a hepatic metastasis in the setting of known recurrent melanoma. 2. Mild hepatic steatosis. 3. Cardiomegaly.    02/27/2022 - 02/27/2022 Chemotherapy   Patient is on Treatment Plan : Capecitabine  (825 mg/m2 bid) + XRT     07/12/2022 -  Chemotherapy   Patient is on Treatment Plan : PANCREAS Gemcitabine  D1,8(1000) q21d     Cholangiocarcinoma (HCC)  12/19/2021 Initial Diagnosis   Liver biopsy showed carcinoma  -The slides on the patient's prior left inguinal lymph node biopsy ([id number]) with metastatic melanoma were reviewed in conjunction with  this case. The morphology of the malignant cells between the two cases are dissimilar. A limited panel of immunohistochemical stains was performed and the neoplastic cells are positive for superpancytokeratin, cytokeratin 7 (diffuse, strong), cytokeratin 20 (patchy, moderate) and negative for S100, SOX-10, and HepPar-1.These findings are consistent with carcinoma. The morphologic findings and pattern of immunohistochemical staining are non-specific and possible sites of origin include but are not limited to pancreaticobiliary tract, GI tract, prostate, kidney, lung, and breast. Per [facility], the patient had a PET scan performed in July 2023 which  demonstrated a single hepatic  hypermetabolic lesion without other sites of disease.    01/19/2022 Surgery   Liver lesion resection at [facility] by Dr.Zani  A.  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  Tumor extends  to hepatic parenchymal margin, but all other margins are negative for tumor. See synoptic report and comment.   Background liver with: Mild steatosis (20%).  No definitive evidence of steatohepatitis.   Mild periportal fibrosis (trichrome and reticulin). No stainable iron  (Prussian blue stain).  No evidence to support alpha-1-antitrypsin deficiency on PAS-D stain.   pT1a pNx   01/25/2022 Cancer Staging   Staging form: Intrahepatic Bile Duct, AJCC 8th Edition - Pathologic stage from 01/25/2022: Stage Unknown (pT1a, pNX, cM0) - Signed by Babara Call, MD on 02/21/2022 Stage prefix: Initial diagnosis   03/23/2022 - 05/01/2022 Radiation Therapy   Concurrent Xeloda  825mg /m2 BID + Radiation.    05/08/2022 -  Chemotherapy   Xeloda  [1000 mg/m2 BID for 14 of every 21 days], plan 4 months.   05/08/2022, cycle 1 Xeloda .  Xeloda  was stopped on 05/19/22 due to developing rash on face as well as dorsum of hands. Treated with steroid,symptoms resolved.  3/4-3/13/2024  Xeloda  was stopped early due to groin cellulitis, he also developed similar skin rash again  Plan 3/25 cycle 3 dose reduce Xeloda  825 mg/m2   2000mg  BID x 14 days.   05/09/2022 Imaging   CT chest abdomen pelvis with contrast at [facility] showed Status post partial left hepatectomy without CT evidence of recurrent or metastatic disease in the chest, abdomen, pelvis.     07/24/2022 - 07/26/2022 Hospital Admission   Patient was hospitalized due to symptomatic anemia, status post 1 unit of PRBC transfusion. Also neutropenia secondary to chemotherapy.  Nadir was 0.9.  Patient was afebrile.   09/05/2022 Imaging   CT chest abdomen pelvis with contrast at [facility]  Postsurgical changes of left hepatectomy without evidence of recurrent or  metastatic disease.    01/02/2023 Imaging   CT chest abdomen pelvis w contrast [facility] No evidence of recurrent or metastatic disease.    05/01/2023 Imaging   CT chest abdomen pelvis w contrast at [facility] showed  Impression:   1. No findings of locally recurrent or metastatic disease within the  chest, abdomen or pelvis.  2.  Diffuse hepatic steatosis.     05/01/2023 Tumor Marker   CA 19.9   8 CEA 0.3    04/18/2021, patient establish care with neurology Dr. Buckley for intermittent altered cognition.  he was recommended to start Vimpat .    INTERVAL HISTORY Foy Vanduyne is a 63 y.o. male who has above history reviewed by me today presents for follow up visit for management of inguinal nodal recurrence of melanoma, resected T1a cholangiocarcinoma.  Discussed the use of AI scribe software for clinical note transcription with the patient, who gave verbal consent to proceed.   02/01/2024, during a family event, he suddenly felt unwell, experienced dizziness and fatigue. His brother noticed these symptoms and suggested a visit to the emergency room, which he initially declined. The symptoms were reminiscent of when he previously anemic requiring blood transfusion.  He takes iron  supplements daily.  Symptoms have improved now.  Patient gets surveillance CT done at [facility].   01/01/2024 CT chest abdomen pelvis with contrast showed no evidence of locally recurrent or metastatic disease in the chest abdomen or pelvis.  Diffuse hepatic steatosis.  He is currently on Eliquis   and has a pacemaker monitor at home.  He also takes Tums for indigestion, which have not been effective, and is considering resuming omeprazole  for better symptom control.  Patient request to prescription of omeprazole .  Patient denies fever, chills, unintentional weight loss, abdominal pain, jaundice  :Review of Systems  Constitutional:  Negative for appetite change, chills, fatigue, fever and unexpected weight change.  HENT:   Negative for hearing loss and voice change.   Eyes:  Negative for eye problems and icterus.  Respiratory:  Negative for chest tightness, cough and shortness of breath.   Cardiovascular:  Positive for leg swelling. Negative for  chest pain.  Gastrointestinal:  Negative for abdominal distention and abdominal pain.  Endocrine: Negative for hot flashes.  Genitourinary:  Negative for difficulty urinating, dysuria and frequency.   Musculoskeletal:        Status post left hip replacement  Skin:  Negative for itching.       Skin hypo-pigmentation on upper extremities, no change   Neurological:  Negative for light-headedness and numbness.  Hematological:  Negative for adenopathy. Does not bruise/bleed easily.  Psychiatric/Behavioral:  Negative for confusion.     MEDICAL HISTORY:  Past Medical History:  Diagnosis Date   AKI (acute kidney injury) 08/24/2018   Anemia    iron  treatments   Anxiety    Aortic atherosclerosis    Arthritis    Cancer of groin (HCC) 2021   left groin, resected, radiation   Cataract    Complication of anesthesia    PONV   Coronary artery disease    Dizziness 12/22/2019   Dizziness of unknown etiology    has led to seizures and passing out.   Elevated serum creatinine 04/15/2021   Exposure to combat 04/20/2020   Exposure to potentially hazardous substance 09/18/2022   Sep 11, 2022 Entered By: ROSANA DRAGON Comment: Entered automatically through [facility] Problem List documentation program     Family history of adverse reaction to anesthesia    PONV mother   GERD (gastroesophageal reflux disease)    H/O Malignant melanoma 09/18/2022   History of complete heart block    PPM placed   Hyperlipidemia    Hypertension    LBBB (left bundle branch block)    Lymphedema of left leg    uses thigh high compression stockings   Melanoma (HCC) 2012   skin cancer, left thigh   OSA on CPAP    PONV (postoperative nausea and vomiting) 04/16/2019   Port-A-Cath in place    RIGHT chest wall   Presence of cardiac pacemaker    [employer]   Problem related to unspecified psychosocial circumstances 09/18/2022   Seizures (HCC)    still has episodes of dizziness. last event 1 month ago (march 2022) and will  pass out. takes clonazepam     SURGICAL HISTORY: Past Surgical History:  Procedure Laterality Date   CARDIOVERSION N/A 05/15/2023   Procedure: CARDIOVERSION;  Surgeon: Ammon Blunt, MD;  Location: [facility] ORS;  Service: Cardiovascular;  Laterality: N/A;   CORONARY ARTERY BYPASS GRAFT N/A 08/10/2021   Procedure: CORONARY ARTERY BYPASS GRAFTING (CABG) X 3 USING LEFT INTERNAL MAMMARY ARTERY AND RIGHT GREATER SAPHENOUS VEIN;  Surgeon: Obadiah Coy, MD;  Location: [facility] OR;  Service: Open Heart Surgery;  Laterality: N/A;   CT RADIATION THERAPY GUIDE     left groin   dental implant     permanent implant   ENDOVEIN HARVEST OF GREATER SAPHENOUS VEIN Right 08/10/2021   Procedure: ENDOVEIN HARVEST OF GREATER SAPHENOUS VEIN;  Surgeon: Obadiah Coy, MD;  Location: [facility] OR;  Service: Open Heart Surgery;  Laterality: Right;   KNEE SURGERY Left    arthroscopy   LEFT HEART CATH AND CORONARY ANGIOGRAPHY Left 06/29/2017   Procedure: LEFT HEART CATH AND CORONARY ANGIOGRAPHY;  Surgeon: Hester Wolm PARAS, MD;  Location: [facility] INVASIVE CV LAB;  Service: Cardiovascular;  Laterality: Left;   LEFT HEART CATH AND CORONARY ANGIOGRAPHY N/A 08/08/2021   Procedure: LEFT HEART CATH AND CORONARY ANGIOGRAPHY;  Surgeon: Hester Wolm PARAS, MD;  Location: [facility] INVASIVE CV LAB;  Service: Cardiovascular;  Laterality: N/A;   LYMPH NODE DISSECTION Left 04/16/2019   Procedure: Left inguinal Lymph Node Dissection;  Surgeon: Aron Shoulders, MD;  Location: [facility] OR;  Service: General;  Laterality: Left;   MELANOMA EXCISION Left 04/16/2019   Procedure: MELANOMA EXCISION LEFT GROIN MASS;  Surgeon: Aron Shoulders, MD;  Location: [facility] OR;  Service: General;  Laterality: Left;   MELANOMA EXCISION WITH SENTINEL LYMPH NODE BIOPSY Left 2012   Left calf    PACEMAKER INSERTION N/A 08/26/2018   Procedure: INSERTION PACEMAKER;  Surgeon: Ammon Blunt, MD;  Location: [facility] ORS;  Service: Cardiovascular;  Laterality: N/A;   PORTA CATH INSERTION N/A  08/26/2019   Procedure: PORTA CATH INSERTION;  Surgeon: Jama Cordella MATSU, MD;  Location: [facility] INVASIVE CV LAB;  Service: Cardiovascular;  Laterality: N/A;   SUPERFICIAL LYMPH NODE BIOPSY / EXCISION Left 2020   lymph nodes removed around left groin melanoma site   TEE WITHOUT CARDIOVERSION N/A 08/10/2021   Procedure: TRANSESOPHAGEAL ECHOCARDIOGRAM (TEE);  Surgeon: Obadiah Coy, MD;  Location: [facility] OR;  Service: Open Heart Surgery;  Laterality: N/A;   TEMPORARY PACEMAKER N/A 08/25/2018   Procedure: TEMPORARY PACEMAKER;  Surgeon: Wonda Sharper, MD;  Location: [facility] INVASIVE CV LAB;  Service: Cardiovascular;  Laterality: N/A;   TOTAL HIP ARTHROPLASTY Left 07/14/2020   Procedure: TOTAL HIP ARTHROPLASTY;  Surgeon: Mardee Lynwood SQUIBB, MD;  Location: [facility] ORS;  Service: Orthopedics;  Laterality: Left;    SOCIAL HISTORY: Social History   Socioeconomic History   Marital status: Married    Spouse name: Therisa    Number of children: 7   Years of education: 12   Highest education level: Not on file  Occupational History    Comment: disability  Tobacco Use   Smoking status: Never   Smokeless tobacco: Never  Vaping Use   Vaping status: Never Used  Substance and Sexual Activity   Alcohol  use: No   Drug use: No   Sexual activity: Not Currently  Other Topics Concern   Not on file  Social History Narrative   Lives with  Wife,   Has 2 small dogs   Caffeine use: sodas (2 per day)      Out of work on disability.  Has a walk in shower. No stairs to climb   Oncology treatment ongoing. Uses port a cath for treatment.      pacemaker   Social Drivers of Health   Financial Resource Strain: Low Risk  (02/12/2019)   Overall Financial Resource Strain (CARDIA)    Difficulty of Paying Living Expenses: Not hard at all  Food Insecurity: No Food Insecurity (03/15/2023)   Hunger Vital Sign    Worried About Running Out of Food in the Last Year: Never true    Ran Out of Food in the Last Year: Never true   Transportation Needs: No Transportation Needs (03/15/2023)   PRAPARE - Administrator, Civil Service (Medical): No  Lack of Transportation (Non-Medical): No  Physical Activity: Inactive (03/15/2023)   Exercise Vital Sign    Days of Exercise per Week: 0 days    Minutes of Exercise per Session: 0 min  Stress: No Stress Concern Present (03/15/2023)   [employer] of Occupational Health - Occupational Stress Questionnaire    Feeling of Stress : Not at all  Social Connections: Unknown (02/12/2019)   Social Connection and Isolation Panel    Frequency of Communication with Friends and Family: More than three times a week    Frequency of Social Gatherings with Friends and Family: Not on file    Attends Religious Services: Not on file    Active Member of Clubs or Organizations: Not on file    Attends Banker Meetings: Not on file    Marital Status: Married  Intimate Partner Violence: Not At Risk (03/15/2023)   Humiliation, Afraid, Rape, and Kick questionnaire    Fear of Current or Ex-Partner: No    Emotionally Abused: No    Physically Abused: No    Sexually Abused: No    FAMILY HISTORY: Family History  Problem Relation Age of Onset   Cancer Paternal Grandmother     ALLERGIES:  is allergic to hydrochlorothiazide , ibuprofen , levetiracetam , nsaids, and capecitabine .  MEDICATIONS:  Current Outpatient Medications  Medication Sig Dispense Refill   apixaban  (ELIQUIS ) 5 MG TABS tablet Take 5 mg by mouth 2 (two) times daily.     aspirin  EC 81 MG tablet Take 81 mg by mouth daily. Swallow whole.     atorvastatin  (LIPITOR) 20 MG tablet Take 20 mg by mouth daily.     Calcium -Vitamin D -Vitamin K 750-500-40 MG-UNT-MCG TABS Take 1 tablet by mouth daily.     clonazePAM  (KLONOPIN ) 0.5 MG tablet TAKE 1 TABLET BY MOUTH IN THE MORNING AND 1 BY MOUTH IN THE EVENING per pt 120 tablet 1   clotrimazole  (CLOTRIMAZOLE  ANTI-FUNGAL) 1 % cream Apply 1 Application topically 2  (two) times daily. Apply to the L inguinal fold 30 g 0   febuxostat  (ULORIC ) 40 MG tablet Take 40 mg by mouth daily.     hydrocortisone  cream 0.5 % Apply 1 Application topically.     lacosamide  (VIMPAT ) 200 MG TABS tablet TAKE 1 TABLET BY MOUTH TWICE A DAY 60 tablet 2   lidocaine -prilocaine  (EMLA ) cream Apply 1 Application topically as needed. Apply small amount of cream to port site approx 1-2 hours prior to appointment. 30 g 11   lisinopril  (ZESTRIL ) 10 MG tablet Take 10 mg by mouth daily.     metoprolol  succinate (TOPROL -XL) 25 MG 24 hr tablet Take 25 mg by mouth daily.     miconazole (MICOTIN) 2 % powder Apply topically as needed for itching.     Midazolam  (NAYZILAM ) 5 MG/0.1ML SOLN Place 5 mg into the nose every 8 (eight) hours as needed (prolonged seizure). 1 each 0   Multiple Vitamin (MULTIVITAMIN WITH MINERALS) TABS tablet Take 1 tablet by mouth daily. Centrum Silver      nystatin  (MYCOSTATIN /NYSTOP ) powder Apply 1 Application topically 3 (three) times daily. (Patient taking differently: Apply 1 Application topically 3 (three) times daily as needed (irritation).) 60 g 1   ondansetron  (ZOFRAN ) 4 MG tablet TAKE 1 TABLET BY MOUTH EVERY 8 HOURS AS NEEDED FOR NAUSEA AND VOMITING 90 tablet 1   Propylene Glycol (SYSTANE BALANCE) 0.6 % SOLN Place 1 drop into both eyes as needed (dry eyes).     traZODone  (DESYREL ) 50 MG tablet Take 50 mg  by mouth at bedtime as needed for sleep.     omeprazole  (PRILOSEC) 20 MG capsule Take 1 capsule (20 mg total) by mouth daily. 90 capsule 1   prochlorperazine  (COMPAZINE ) 10 MG tablet Take 1 tablet (10 mg total) by mouth every 6 (six) hours as needed for nausea or vomiting. (Patient not taking: Reported on 02/14/2024) 30 tablet 0   No current facility-administered medications for this visit.   Facility-Administered Medications Ordered in Other Visits  Medication Dose Route Frequency Provider Last Rate Last Admin   heparin  lock flush 100 UNIT/ML injection             heparin  lock flush 100 UNIT/ML injection            heparin  lock flush 100 unit/mL  500 Units Intravenous Once Babara Call, MD       sodium chloride  flush (NS) 0.9 % injection 10 mL  10 mL Intravenous PRN Babara Call, MD         PHYSICAL EXAMINATION: ECOG PERFORMANCE STATUS: 1 - Symptomatic but completely ambulatory Vitals:   02/14/24 1012 02/14/24 1021  BP: (!) 156/58 127/62  Pulse: 85   Resp: 18   Temp: (!) 97.4 F (36.3 C)   SpO2: 98%     Filed Weights   02/14/24 1012  Weight: 248 lb (112.5 kg)     Physical Exam Constitutional:      General: He is not in acute distress.    Appearance: He is obese.     Comments: Patient ambulates independently  HENT:     Head: Normocephalic and atraumatic.  Eyes:     General: No scleral icterus.    Pupils: Pupils are equal, round, and reactive to light.  Cardiovascular:     Rate and Rhythm: Normal rate and regular rhythm.  Pulmonary:     Effort: Pulmonary effort is normal. No respiratory distress.     Breath sounds: No wheezing.  Abdominal:     General: Bowel sounds are normal. There is no distension.     Palpations: Abdomen is soft. There is no mass.     Tenderness: There is no abdominal tenderness.     Comments:    Musculoskeletal:        General: No deformity. Normal range of motion.     Cervical back: Normal range of motion and neck supple.     Comments: Left lower extremity edema-chronic   Skin:    General: Skin is warm and dry.  Neurological:     Mental Status: He is alert and oriented to person, place, and time. Mental status is at baseline.  Psychiatric:        Mood and Affect: Mood normal.     LABORATORY DATA:  I have reviewed the data as listed    Latest Ref Rng & Units 02/12/2024    9:17 AM 10/26/2023   10:58 AM 08/20/2023   10:46 AM  CBC  WBC 4.0 - 10.5 K/uL 4.3  4.0  4.3   Hemoglobin 13.0 - 17.0 g/dL 88.5  87.8  88.5   Hematocrit 39.0 - 52.0 % 32.9  37.6  34.0   Platelets 150 - 400 K/uL 136  169  157        Latest Ref Rng & Units 02/12/2024    9:18 AM 10/26/2023   10:58 AM 08/20/2023   12:23 PM  CMP  Glucose 70 - 99 mg/dL 822  96    BUN 8 - 23 mg/dL 27  22  Creatinine 0.61 - 1.24 mg/dL 8.78  8.71    Sodium 864 - 145 mmol/L 135  140    Potassium 3.5 - 5.1 mmol/L 4.1  5.3    Chloride 98 - 111 mmol/L 104  104    CO2 22 - 32 mmol/L 21  22    Calcium  8.9 - 10.3 mg/dL 9.1  89.7    Total Protein 6.5 - 8.1 g/dL 7.5  7.4  7.0   Total Bilirubin 0.0 - 1.2 mg/dL 0.6  0.3  0.6   Alkaline Phos 38 - 126 U/L 66  83  59   AST 15 - 41 U/L 41  50  42   ALT 0 - 44 U/L 53  68  64      RADIOGRAPHIC STUDIES: I have personally reviewed the radiological images as listed and agreed with the findings in the report. No results found.

## 2024-02-14 NOTE — Assessment & Plan Note (Signed)
 1 episode of dizziness and fatigue.  Symptom has resolved. I suspect the patient may have had dehydration. Encourage patient to increase oral hydration.  If symptom recurs, despite good hydration, recommend patient to go to emergency room for further evaluation.

## 2024-02-14 NOTE — Assessment & Plan Note (Addendum)
 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  S/p chemotherapy Xeloda  825mg /m2 BID+ concurrent RT Previously on adjuvant Xeloda  [1000 mg/m2 BID for 14 of every 21 days] -dose reduced to 825mg /m2 BID due to skin rash, still not able to tolerate- discontinue.--> s/p 6 cycles gemcitabine  weekly, 2 weeks on, 1 week off, [adjustment due to cytopenia]  Labs are reviewed and discussed with patient.  October 2025  surveillance CT scan  at [facility]- no recurrence. Tumor markers stable.  Continue surveillance.

## 2024-02-14 NOTE — Assessment & Plan Note (Signed)
 anemia due to CKD slight decrease of hemoglobin comparing to his baseline.  Recommend patient to resume oral iron  supplementation once daily.

## 2024-02-14 NOTE — Assessment & Plan Note (Signed)
 Port flush every 6 to 8 weeks.

## 2024-02-15 ENCOUNTER — Other Ambulatory Visit: Payer: Self-pay

## 2024-02-25 ENCOUNTER — Ambulatory Visit: Admitting: Family

## 2024-03-07 ENCOUNTER — Other Ambulatory Visit

## 2024-03-10 ENCOUNTER — Ambulatory Visit: Admitting: Oncology

## 2024-03-10 ENCOUNTER — Other Ambulatory Visit

## 2024-03-15 ENCOUNTER — Other Ambulatory Visit: Payer: Self-pay | Admitting: Internal Medicine

## 2024-03-18 ENCOUNTER — Encounter: Payer: Self-pay | Admitting: Oncology

## 2024-03-25 ENCOUNTER — Inpatient Hospital Stay: Attending: Oncology

## 2024-03-25 DIAGNOSIS — Z452 Encounter for adjustment and management of vascular access device: Secondary | ICD-10-CM | POA: Diagnosis not present

## 2024-03-25 DIAGNOSIS — Z85820 Personal history of malignant melanoma of skin: Secondary | ICD-10-CM | POA: Insufficient documentation

## 2024-03-25 DIAGNOSIS — Z8505 Personal history of malignant neoplasm of liver: Secondary | ICD-10-CM | POA: Diagnosis present

## 2024-04-06 ENCOUNTER — Other Ambulatory Visit: Payer: Self-pay | Admitting: Internal Medicine

## 2024-04-15 ENCOUNTER — Other Ambulatory Visit: Payer: Self-pay | Admitting: Nurse Practitioner

## 2024-05-13 ENCOUNTER — Inpatient Hospital Stay: Attending: Oncology

## 2024-06-05 ENCOUNTER — Ambulatory Visit: Admitting: Physician Assistant

## 2024-06-20 ENCOUNTER — Ambulatory Visit: Admitting: Internal Medicine

## 2024-07-01 ENCOUNTER — Inpatient Hospital Stay: Attending: Oncology

## 2024-08-13 ENCOUNTER — Inpatient Hospital Stay

## 2024-08-20 ENCOUNTER — Inpatient Hospital Stay: Admitting: Oncology
